# Patient Record
Sex: Male | Born: 1960 | Race: Black or African American | Hispanic: No | Smoking: Former smoker
Health system: Southern US, Community
[De-identification: ages and names within clinical notes are randomized; demographics above are authoritative.]

## PROBLEM LIST (undated history)

## (undated) ENCOUNTER — Emergency Department (HOSPITAL_COMMUNITY): Admission: EM | Disposition: A | Discharge status: Home / Self Care | Payer: 59 | Source: Home / Self Care

## (undated) DIAGNOSIS — I10 Essential (primary) hypertension: Secondary | ICD-10-CM

## (undated) DIAGNOSIS — Z992 Dependence on renal dialysis: Secondary | ICD-10-CM

## (undated) DIAGNOSIS — E119 Type 2 diabetes mellitus without complications: Secondary | ICD-10-CM

## (undated) DIAGNOSIS — F4310 Post-traumatic stress disorder, unspecified: Secondary | ICD-10-CM

## (undated) DIAGNOSIS — N186 End stage renal disease: Secondary | ICD-10-CM

## (undated) DIAGNOSIS — N289 Disorder of kidney and ureter, unspecified: Secondary | ICD-10-CM

## (undated) DIAGNOSIS — I219 Acute myocardial infarction, unspecified: Secondary | ICD-10-CM

## (undated) DIAGNOSIS — F32A Depression, unspecified: Secondary | ICD-10-CM

## (undated) DIAGNOSIS — Z5189 Encounter for other specified aftercare: Secondary | ICD-10-CM

## (undated) DIAGNOSIS — G4730 Sleep apnea, unspecified: Secondary | ICD-10-CM

## (undated) DIAGNOSIS — R011 Cardiac murmur, unspecified: Secondary | ICD-10-CM

## (undated) DIAGNOSIS — Z993 Dependence on wheelchair: Secondary | ICD-10-CM

## (undated) DIAGNOSIS — R0600 Dyspnea, unspecified: Secondary | ICD-10-CM

## (undated) DIAGNOSIS — A419 Sepsis, unspecified organism: Secondary | ICD-10-CM

## (undated) DIAGNOSIS — J449 Chronic obstructive pulmonary disease, unspecified: Secondary | ICD-10-CM

## (undated) DIAGNOSIS — K219 Gastro-esophageal reflux disease without esophagitis: Secondary | ICD-10-CM

## (undated) DIAGNOSIS — M1990 Unspecified osteoarthritis, unspecified site: Secondary | ICD-10-CM

## (undated) DIAGNOSIS — I509 Heart failure, unspecified: Secondary | ICD-10-CM

## (undated) HISTORY — PX: JOINT REPLACEMENT: SHX530

---

## 2005-01-19 DIAGNOSIS — N521 Erectile dysfunction due to diseases classified elsewhere: Secondary | ICD-10-CM | POA: Insufficient documentation

## 2008-05-12 DIAGNOSIS — K219 Gastro-esophageal reflux disease without esophagitis: Secondary | ICD-10-CM | POA: Insufficient documentation

## 2012-07-30 DIAGNOSIS — E782 Mixed hyperlipidemia: Secondary | ICD-10-CM | POA: Insufficient documentation

## 2013-05-02 DIAGNOSIS — Z96653 Presence of artificial knee joint, bilateral: Secondary | ICD-10-CM | POA: Insufficient documentation

## 2013-08-26 DIAGNOSIS — M1711 Unilateral primary osteoarthritis, right knee: Secondary | ICD-10-CM | POA: Insufficient documentation

## 2013-08-26 DIAGNOSIS — M170 Bilateral primary osteoarthritis of knee: Secondary | ICD-10-CM | POA: Insufficient documentation

## 2013-12-24 DIAGNOSIS — I5032 Chronic diastolic (congestive) heart failure: Secondary | ICD-10-CM | POA: Insufficient documentation

## 2013-12-24 DIAGNOSIS — G4733 Obstructive sleep apnea (adult) (pediatric): Secondary | ICD-10-CM | POA: Insufficient documentation

## 2014-01-13 DIAGNOSIS — K2950 Unspecified chronic gastritis without bleeding: Secondary | ICD-10-CM | POA: Insufficient documentation

## 2014-01-13 DIAGNOSIS — E1142 Type 2 diabetes mellitus with diabetic polyneuropathy: Secondary | ICD-10-CM | POA: Insufficient documentation

## 2014-04-29 DIAGNOSIS — I272 Pulmonary hypertension, unspecified: Secondary | ICD-10-CM | POA: Insufficient documentation

## 2015-12-21 DIAGNOSIS — F5104 Psychophysiologic insomnia: Secondary | ICD-10-CM | POA: Insufficient documentation

## 2016-04-18 DIAGNOSIS — E559 Vitamin D deficiency, unspecified: Secondary | ICD-10-CM | POA: Insufficient documentation

## 2016-05-30 DIAGNOSIS — F32A Depression, unspecified: Secondary | ICD-10-CM | POA: Insufficient documentation

## 2016-05-30 DIAGNOSIS — F330 Major depressive disorder, recurrent, mild: Secondary | ICD-10-CM | POA: Insufficient documentation

## 2016-08-15 DIAGNOSIS — Z86010 Personal history of colonic polyps: Secondary | ICD-10-CM | POA: Insufficient documentation

## 2017-12-15 DIAGNOSIS — M545 Low back pain, unspecified: Secondary | ICD-10-CM | POA: Insufficient documentation

## 2017-12-15 DIAGNOSIS — G8929 Other chronic pain: Secondary | ICD-10-CM | POA: Insufficient documentation

## 2018-01-25 DIAGNOSIS — M51369 Other intervertebral disc degeneration, lumbar region without mention of lumbar back pain or lower extremity pain: Secondary | ICD-10-CM | POA: Insufficient documentation

## 2018-01-25 DIAGNOSIS — M5136 Other intervertebral disc degeneration, lumbar region: Secondary | ICD-10-CM | POA: Insufficient documentation

## 2018-02-12 DIAGNOSIS — M12812 Other specific arthropathies, not elsewhere classified, left shoulder: Secondary | ICD-10-CM | POA: Insufficient documentation

## 2018-02-12 DIAGNOSIS — M19012 Primary osteoarthritis, left shoulder: Secondary | ICD-10-CM | POA: Insufficient documentation

## 2018-02-12 DIAGNOSIS — M12811 Other specific arthropathies, not elsewhere classified, right shoulder: Secondary | ICD-10-CM | POA: Insufficient documentation

## 2018-02-12 DIAGNOSIS — M19011 Primary osteoarthritis, right shoulder: Secondary | ICD-10-CM | POA: Insufficient documentation

## 2018-08-28 DIAGNOSIS — M4807 Spinal stenosis, lumbosacral region: Secondary | ICD-10-CM | POA: Insufficient documentation

## 2018-10-04 DIAGNOSIS — Z8614 Personal history of Methicillin resistant Staphylococcus aureus infection: Secondary | ICD-10-CM | POA: Insufficient documentation

## 2019-04-02 DIAGNOSIS — M5030 Other cervical disc degeneration, unspecified cervical region: Secondary | ICD-10-CM | POA: Insufficient documentation

## 2019-06-25 DIAGNOSIS — E8770 Fluid overload, unspecified: Secondary | ICD-10-CM | POA: Insufficient documentation

## 2019-10-28 DIAGNOSIS — M5412 Radiculopathy, cervical region: Secondary | ICD-10-CM | POA: Insufficient documentation

## 2019-11-06 DIAGNOSIS — Z8616 Personal history of COVID-19: Secondary | ICD-10-CM | POA: Insufficient documentation

## 2020-05-07 DIAGNOSIS — R252 Cramp and spasm: Secondary | ICD-10-CM | POA: Insufficient documentation

## 2020-05-08 DIAGNOSIS — M2041 Other hammer toe(s) (acquired), right foot: Secondary | ICD-10-CM | POA: Insufficient documentation

## 2020-05-08 DIAGNOSIS — M21611 Bunion of right foot: Secondary | ICD-10-CM | POA: Insufficient documentation

## 2020-05-08 DIAGNOSIS — B351 Tinea unguium: Secondary | ICD-10-CM | POA: Insufficient documentation

## 2020-05-08 DIAGNOSIS — M2042 Other hammer toe(s) (acquired), left foot: Secondary | ICD-10-CM | POA: Insufficient documentation

## 2020-07-03 DIAGNOSIS — G959 Disease of spinal cord, unspecified: Secondary | ICD-10-CM | POA: Insufficient documentation

## 2020-07-21 DIAGNOSIS — T7840XS Allergy, unspecified, sequela: Secondary | ICD-10-CM | POA: Insufficient documentation

## 2020-07-21 DIAGNOSIS — D689 Coagulation defect, unspecified: Secondary | ICD-10-CM | POA: Insufficient documentation

## 2020-07-21 DIAGNOSIS — Z23 Encounter for immunization: Secondary | ICD-10-CM | POA: Insufficient documentation

## 2020-07-21 DIAGNOSIS — Z87892 Personal history of anaphylaxis: Secondary | ICD-10-CM | POA: Insufficient documentation

## 2020-08-15 DIAGNOSIS — N2581 Secondary hyperparathyroidism of renal origin: Secondary | ICD-10-CM | POA: Insufficient documentation

## 2020-08-20 DIAGNOSIS — G8929 Other chronic pain: Secondary | ICD-10-CM | POA: Insufficient documentation

## 2020-08-20 DIAGNOSIS — R52 Pain, unspecified: Secondary | ICD-10-CM | POA: Insufficient documentation

## 2020-08-30 DIAGNOSIS — Z79899 Other long term (current) drug therapy: Secondary | ICD-10-CM | POA: Insufficient documentation

## 2020-08-31 DIAGNOSIS — Z981 Arthrodesis status: Secondary | ICD-10-CM | POA: Insufficient documentation

## 2020-09-06 DIAGNOSIS — E441 Mild protein-calorie malnutrition: Secondary | ICD-10-CM | POA: Insufficient documentation

## 2020-10-12 DIAGNOSIS — R0600 Dyspnea, unspecified: Secondary | ICD-10-CM | POA: Insufficient documentation

## 2020-10-12 DIAGNOSIS — M79603 Pain in arm, unspecified: Secondary | ICD-10-CM | POA: Insufficient documentation

## 2020-11-29 DIAGNOSIS — E876 Hypokalemia: Secondary | ICD-10-CM | POA: Insufficient documentation

## 2021-03-08 ENCOUNTER — Emergency Department (HOSPITAL_COMMUNITY): Payer: Medicare Other

## 2021-03-08 ENCOUNTER — Other Ambulatory Visit: Payer: Self-pay

## 2021-03-08 ENCOUNTER — Emergency Department (HOSPITAL_COMMUNITY)
Admission: EM | Admit: 2021-03-08 | Discharge: 2021-03-08 | Disposition: A | Discharge status: Home / Self Care | Payer: Medicare Other | Attending: Emergency Medicine | Admitting: Emergency Medicine

## 2021-03-08 ENCOUNTER — Encounter (HOSPITAL_COMMUNITY): Payer: Self-pay

## 2021-03-08 DIAGNOSIS — E1122 Type 2 diabetes mellitus with diabetic chronic kidney disease: Secondary | ICD-10-CM | POA: Insufficient documentation

## 2021-03-08 DIAGNOSIS — T829XXA Unspecified complication of cardiac and vascular prosthetic device, implant and graft, initial encounter: Secondary | ICD-10-CM | POA: Diagnosis present

## 2021-03-08 DIAGNOSIS — N186 End stage renal disease: Secondary | ICD-10-CM | POA: Insufficient documentation

## 2021-03-08 DIAGNOSIS — J449 Chronic obstructive pulmonary disease, unspecified: Secondary | ICD-10-CM | POA: Diagnosis not present

## 2021-03-08 DIAGNOSIS — M7989 Other specified soft tissue disorders: Secondary | ICD-10-CM | POA: Insufficient documentation

## 2021-03-08 DIAGNOSIS — X58XXXA Exposure to other specified factors, initial encounter: Secondary | ICD-10-CM | POA: Insufficient documentation

## 2021-03-08 DIAGNOSIS — R42 Dizziness and giddiness: Secondary | ICD-10-CM | POA: Insufficient documentation

## 2021-03-08 DIAGNOSIS — Z992 Dependence on renal dialysis: Secondary | ICD-10-CM | POA: Insufficient documentation

## 2021-03-08 DIAGNOSIS — E11649 Type 2 diabetes mellitus with hypoglycemia without coma: Secondary | ICD-10-CM | POA: Insufficient documentation

## 2021-03-08 DIAGNOSIS — Z87891 Personal history of nicotine dependence: Secondary | ICD-10-CM | POA: Insufficient documentation

## 2021-03-08 DIAGNOSIS — E162 Hypoglycemia, unspecified: Secondary | ICD-10-CM

## 2021-03-08 DIAGNOSIS — H538 Other visual disturbances: Secondary | ICD-10-CM | POA: Diagnosis not present

## 2021-03-08 DIAGNOSIS — Z96653 Presence of artificial knee joint, bilateral: Secondary | ICD-10-CM | POA: Insufficient documentation

## 2021-03-08 DIAGNOSIS — I509 Heart failure, unspecified: Secondary | ICD-10-CM | POA: Insufficient documentation

## 2021-03-08 DIAGNOSIS — I132 Hypertensive heart and chronic kidney disease with heart failure and with stage 5 chronic kidney disease, or end stage renal disease: Secondary | ICD-10-CM | POA: Diagnosis not present

## 2021-03-08 DIAGNOSIS — Z20822 Contact with and (suspected) exposure to covid-19: Secondary | ICD-10-CM | POA: Diagnosis not present

## 2021-03-08 HISTORY — DX: Unspecified osteoarthritis, unspecified site: M19.90

## 2021-03-08 HISTORY — DX: Type 2 diabetes mellitus without complications: E11.9

## 2021-03-08 HISTORY — DX: Disorder of kidney and ureter, unspecified: N28.9

## 2021-03-08 HISTORY — DX: Heart failure, unspecified: I50.9

## 2021-03-08 HISTORY — DX: Post-traumatic stress disorder, unspecified: F43.10

## 2021-03-08 HISTORY — DX: Chronic obstructive pulmonary disease, unspecified: J44.9

## 2021-03-08 HISTORY — DX: Essential (primary) hypertension: I10

## 2021-03-08 HISTORY — DX: Encounter for other specified aftercare: Z51.89

## 2021-03-08 LAB — CBC WITH DIFFERENTIAL/PLATELET
Abs Immature Granulocytes: 0.09 10*3/uL — ABNORMAL HIGH (ref 0.00–0.07)
Basophils Absolute: 0 10*3/uL (ref 0.0–0.1)
Basophils Relative: 0 %
Eosinophils Absolute: 0.3 10*3/uL (ref 0.0–0.5)
Eosinophils Relative: 4 %
HCT: 30.6 % — ABNORMAL LOW (ref 39.0–52.0)
Hemoglobin: 9.3 g/dL — ABNORMAL LOW (ref 13.0–17.0)
Immature Granulocytes: 1 %
Lymphocytes Relative: 16 %
Lymphs Abs: 1.2 10*3/uL (ref 0.7–4.0)
MCH: 29.2 pg (ref 26.0–34.0)
MCHC: 30.4 g/dL (ref 30.0–36.0)
MCV: 95.9 fL (ref 80.0–100.0)
Monocytes Absolute: 0.5 10*3/uL (ref 0.1–1.0)
Monocytes Relative: 7 %
Neutro Abs: 5.2 10*3/uL (ref 1.7–7.7)
Neutrophils Relative %: 72 %
Platelets: 101 10*3/uL — ABNORMAL LOW (ref 150–400)
RBC: 3.19 MIL/uL — ABNORMAL LOW (ref 4.22–5.81)
RDW: 17.3 % — ABNORMAL HIGH (ref 11.5–15.5)
WBC: 7.3 10*3/uL (ref 4.0–10.5)
nRBC: 0 % (ref 0.0–0.2)

## 2021-03-08 LAB — COMPREHENSIVE METABOLIC PANEL
ALT: 19 U/L (ref 0–44)
AST: 26 U/L (ref 15–41)
Albumin: 3.7 g/dL (ref 3.5–5.0)
Alkaline Phosphatase: 71 U/L (ref 38–126)
Anion gap: 14 (ref 5–15)
BUN: 34 mg/dL — ABNORMAL HIGH (ref 6–20)
CO2: 23 mmol/L (ref 22–32)
Calcium: 8.8 mg/dL — ABNORMAL LOW (ref 8.9–10.3)
Chloride: 100 mmol/L (ref 98–111)
Creatinine, Ser: 2.4 mg/dL — ABNORMAL HIGH (ref 0.61–1.24)
GFR, Estimated: 30 mL/min — ABNORMAL LOW (ref >60)
Glucose, Bld: 73 mg/dL (ref 70–99)
Potassium: 3.6 mmol/L (ref 3.5–5.1)
Sodium: 137 mmol/L (ref 135–145)
Total Bilirubin: 0.6 mg/dL (ref 0.3–1.2)
Total Protein: 7.7 g/dL (ref 6.5–8.1)

## 2021-03-08 LAB — CBG MONITORING, ED
Glucose-Capillary: 41 mg/dL — CL (ref 70–99)
Glucose-Capillary: 215 mg/dL — ABNORMAL HIGH (ref 70–99)

## 2021-03-08 LAB — RESP PANEL BY RT-PCR (FLU A&B, COVID) ARPGX2
Influenza A by PCR: NEGATIVE
Influenza B by PCR: NEGATIVE
SARS Coronavirus 2 by RT PCR: NEGATIVE

## 2021-03-08 MED ORDER — DEXTROSE 50 % IV SOLN
1.0000 | Freq: Once | INTRAVENOUS | Status: AC
  Administered 2021-03-08: 50 mL via INTRAVENOUS
  Filled 2021-03-08: qty 50

## 2021-03-08 NOTE — Discharge Instructions (Addendum)
Follow-up for the fistulogram on Thursday as planned.  Use the catheter for dialysis on Wednesday, not the graft.

## 2021-03-08 NOTE — ED Notes (Signed)
Got patient undressed into a gown on the monitor did ekg shown to Dr Alvino Chapel patient is resting with call bell in reach

## 2021-03-08 NOTE — ED Notes (Signed)
Reviewed discharge instructions with patient. Follow-up care reviewed. Patient verbalized understanding. Patient A&Ox4, VSS upon discharge.  

## 2021-03-08 NOTE — ED Notes (Signed)
Pt reported feeling like his sugar was low and that his glucometer on his arm was reading low. Checked CBG and it was 41. Notified PA and will notify Pickering MD. Hanley Seamen pt 2 OJ w/ sugar and crackers and peanut butter. Will recheck CBG and continue to monitor pt.

## 2021-03-08 NOTE — ED Notes (Signed)
CBG 215

## 2021-03-08 NOTE — ED Provider Notes (Addendum)
[facility] EMERGENCY DEPARTMENT Provider Note   CSN: [id number] Arrival date & time: 03/08/21  1114     History Chief Complaint  Patient presents with  . Numbness  . Blurred Vision  . Dizziness    Jeffrey [name] is a 60 y.o. male.  HPI Patient presents with swelling at his AV graft.  Recently started using graft.  Was used on Friday but only able to use 1 needle in it.  Other dialysis done through chest catheter.  Today, off Monday, the patient had a needle placed in the lower part of the graft.  States symptoms began to hurt and swell up more.  States was dialyzed 3 out of 4 hours out of his chest wall catheter.  States he now cannot feel either have his upper extremities.  States his fingers feel numb.  Also states he cannot move his lower legs.  However is able to move his legs to command.  States he feels as if he is caring extra fluid.  States he has to be removed from dialysis because he was feeling bad.  His vascular surgeon and nephrologist are at [facility].    Past Medical History:  Diagnosis Date  . Arthritis   . Blood transfusion without reported diagnosis   . CHF (congestive heart failure) ([place])   . COPD (chronic obstructive pulmonary disease) ([place])   . Diabetes mellitus without complication ([place])   . Hypertension   . PTSD (post-traumatic stress disorder)   . Renal disorder    Stage 5 Kidney Failure    There are no problems to display for this patient.   Past Surgical History:  Procedure Laterality Date  . JOINT REPLACEMENT     Bilateral knees       History reviewed. No pertinent family history.  Social History   Tobacco Use  . Smoking status: Former Smoker    Types: Cigarettes    Quit date: 2012    Years since quitting: 10.2  . Smokeless tobacco: Never Used  Vaping Use  . Vaping Use: Never used  Substance Use Topics  . Alcohol use: Yes  . Drug use: Not Currently    Types: Marijuana    Comment: Years ago    Home  Medications Prior to Admission medications   Not on File    Allergies    Bupropion, Dexmedetomidine, Ibuprofen, and Pioglitazone  Review of Systems   Review of Systems  Constitutional: Negative for appetite change and fever.  HENT: Negative for congestion.   Respiratory: Negative for shortness of breath.   Cardiovascular: Positive for leg swelling.  Gastrointestinal: Negative for anal bleeding.  Genitourinary: Negative for flank pain.  Musculoskeletal: Negative for back pain.  Skin: Negative for wound.  Neurological: Positive for weakness and numbness.  Psychiatric/Behavioral: Negative for confusion.    Physical Exam Updated Vital Signs BP 118/63   Pulse 91   Temp 99.1 F (37.3 C) (Oral)   Resp 20   Ht '5\' 5"'$  (1.651 m)   Wt 127.4 kg Comment: prior to dialysis tx  SpO2 97%   BMI 46.74 kg/m   Physical Exam Vitals and nursing note reviewed.  HENT:     Head: Normocephalic.  Eyes:     Pupils: Pupils are equal, round, and reactive to light.  Cardiovascular:     Rate and Rhythm: Regular rhythm.  Pulmonary:     Breath sounds: No rales.     Comments: Dialysis catheter right upper chest wall. Chest:  Chest wall: No tenderness.  Abdominal:     Tenderness: There is no abdominal tenderness.  Musculoskeletal:     Cervical back: Neck supple.     Comments: Dialysis graft right upper extremity.  Some edema bilateral upper extremities.  Worse on right.  Skin:    Capillary Refill: Capillary refill takes less than 2 seconds.  Neurological:     Mental Status: He is alert and oriented to person, place, and time.  Psychiatric:        Mood and Affect: Mood normal.     ED Results / Procedures / Treatments   Labs (all labs ordered are listed, but only abnormal results are displayed) Labs Reviewed  COMPREHENSIVE METABOLIC PANEL - Abnormal; Notable for the following components:      Result Value   BUN 34 (*)    Creatinine, Ser 2.40 (*)    Calcium 8.8 (*)    GFR, Estimated  30 (*)    All other components within normal limits  CBC WITH DIFFERENTIAL/PLATELET - Abnormal; Notable for the following components:   RBC 3.19 (*)    Hemoglobin 9.3 (*)    HCT 30.6 (*)    RDW 17.3 (*)    Platelets 101 (*)    Abs Immature Granulocytes 0.09 (*)    All other components within normal limits  CBG MONITORING, ED - Abnormal; Notable for the following components:   Glucose-Capillary 41 (*)    All other components within normal limits  CBG MONITORING, ED - Abnormal; Notable for the following components:   Glucose-Capillary 215 (*)    All other components within normal limits  RESP PANEL BY RT-PCR (FLU A&B, COVID) ARPGX2    EKG EKG Interpretation  Date/Time:  Monday March 08 2021 11:24:14 EDT Ventricular Rate:  89 PR Interval:  137 QRS Duration: 86 QT Interval:  387 QTC Calculation: 471 R Axis:   67 Text Interpretation: Sinus rhythm Confirmed by Davonna Belling [phone]) on 03/08/2021 1:15:57 PM   Radiology DG Chest Portable 1 View  Result Date: 03/08/2021 CLINICAL DATA:  Weakness, on dialysis EXAM: PORTABLE CHEST 1 VIEW COMPARISON:  None. FINDINGS: Right IJ approach hemodialysis catheter terminates at the level of the right atrium. Heart size within normal limits. Slightly low lung volumes. No vascular congestion or significant edema. No airspace consolidation. No pleural effusion or pneumothorax. Degenerative changes of the right shoulder. IMPRESSION: No acute cardiopulmonary findings. Electronically Signed   By: Davina Poke D.O.   On: 03/08/2021 14:37    Procedures Procedures   Medications Ordered in ED Medications  dextrose 50 % solution 50 mL (50 mLs Intravenous Given 03/08/21 1541)    ED Course  I have reviewed the triage vital signs and the nursing notes.  Pertinent labs & imaging results that were available during my care of the patient were reviewed by me and considered in my medical decision making (see chart for details).    MDM  Rules/Calculators/A&P                          Patient brought in after having difficulty with his dialysis graft on the right upper extremity.  Some swelling.  Potentially hematoma but does not appear to be actively extravasating at this time.  Had 3 out of his 4 hours of dialysis done.  States he was tingly and numb but seem to be covering all of his body with it.  Feeling somewhat better now.  His weight is up  but does not appear to need emergent dialysis.  Discussed with Dr. Augustin Coupe from nephrology and Dr. Carlis Abbott from vascular surgery.  Appears stable for outpatient follow-up.  Patient's own vascular surgeon has arranged for fistulogram on Thursday.  Can be dialyzed on Wednesday but will only use his chest catheter.  Discharge home. Patient did have hypoglycemia while in the ER.  Treated with some IV glucose and oral food.  Stable for discharge where he can manage that also. Final Clinical Impression(s) / ED Diagnoses Final diagnoses:  End stage renal disease on dialysis [facility])  Complications due to renal dialysis device, implant, and graft, initial encounter  Hypoglycemia    Rx / DC Orders ED Discharge Orders    None       Davonna Belling, MD 03/08/21 [place], Knox Cervi, MD 04/01/21 [phone]

## 2021-03-08 NOTE — ED Triage Notes (Addendum)
Pt arrived via EMS from dialysis center w/ c/o numbness and swelling in all 4 extremities. Onset of s/s 0900.Pt is on a MWF schedule, and recently received approval for use of a new graft site on the rt arm. Dialysis was unable to access this graft site and used the dialysis port on the rt chest. Pt received 4 out of 5 hours of treatment today. Pt also c/o blurred vision and dizziness onset also 0900 after dialysis tried to access R upper arm graft. Pt reports feeling like extremities are "full of fluid". Reports decreased sensation to all four extremities, an no sensation to fingertips. VSS w/ BP 142/96, HR 86, RR 20, SpO2 100% RA, and CBG of 96.  Pt reports all of his information is at [facility]. He lives in [place] and does dialysis at [facility].

## 2021-03-28 ENCOUNTER — Encounter (HOSPITAL_COMMUNITY): Payer: Self-pay | Admitting: Emergency Medicine

## 2021-03-28 ENCOUNTER — Emergency Department (HOSPITAL_COMMUNITY): Payer: Medicare Other

## 2021-03-28 ENCOUNTER — Inpatient Hospital Stay (HOSPITAL_COMMUNITY)
Admission: EM | Admit: 2021-03-28 | Discharge: 2021-04-14 | DRG: 474 | Disposition: A | Discharge status: Skilled Nursing Facility | Payer: Medicare Other | Attending: Internal Medicine | Admitting: Internal Medicine

## 2021-03-28 DIAGNOSIS — D6959 Other secondary thrombocytopenia: Secondary | ICD-10-CM | POA: Diagnosis present

## 2021-03-28 DIAGNOSIS — A419 Sepsis, unspecified organism: Secondary | ICD-10-CM

## 2021-03-28 DIAGNOSIS — R7881 Bacteremia: Secondary | ICD-10-CM

## 2021-03-28 DIAGNOSIS — Z79899 Other long term (current) drug therapy: Secondary | ICD-10-CM

## 2021-03-28 DIAGNOSIS — E1122 Type 2 diabetes mellitus with diabetic chronic kidney disease: Secondary | ICD-10-CM | POA: Diagnosis present

## 2021-03-28 DIAGNOSIS — Z794 Long term (current) use of insulin: Secondary | ICD-10-CM

## 2021-03-28 DIAGNOSIS — R6520 Severe sepsis without septic shock: Secondary | ICD-10-CM | POA: Diagnosis present

## 2021-03-28 DIAGNOSIS — T8454XA Infection and inflammatory reaction due to internal left knee prosthesis, initial encounter: Secondary | ICD-10-CM | POA: Diagnosis not present

## 2021-03-28 DIAGNOSIS — E11649 Type 2 diabetes mellitus with hypoglycemia without coma: Secondary | ICD-10-CM | POA: Diagnosis not present

## 2021-03-28 DIAGNOSIS — A4102 Sepsis due to Methicillin resistant Staphylococcus aureus: Secondary | ICD-10-CM | POA: Diagnosis present

## 2021-03-28 DIAGNOSIS — Z96651 Presence of right artificial knee joint: Secondary | ICD-10-CM | POA: Diagnosis present

## 2021-03-28 DIAGNOSIS — M25562 Pain in left knee: Secondary | ICD-10-CM

## 2021-03-28 DIAGNOSIS — E1165 Type 2 diabetes mellitus with hyperglycemia: Secondary | ICD-10-CM | POA: Diagnosis present

## 2021-03-28 DIAGNOSIS — G546 Phantom limb syndrome with pain: Secondary | ICD-10-CM | POA: Diagnosis present

## 2021-03-28 DIAGNOSIS — F4310 Post-traumatic stress disorder, unspecified: Secondary | ICD-10-CM

## 2021-03-28 DIAGNOSIS — I132 Hypertensive heart and chronic kidney disease with heart failure and with stage 5 chronic kidney disease, or end stage renal disease: Secondary | ICD-10-CM | POA: Diagnosis present

## 2021-03-28 DIAGNOSIS — D649 Anemia, unspecified: Secondary | ICD-10-CM

## 2021-03-28 DIAGNOSIS — E871 Hypo-osmolality and hyponatremia: Secondary | ICD-10-CM | POA: Diagnosis present

## 2021-03-28 DIAGNOSIS — D638 Anemia in other chronic diseases classified elsewhere: Secondary | ICD-10-CM

## 2021-03-28 DIAGNOSIS — N186 End stage renal disease: Secondary | ICD-10-CM

## 2021-03-28 DIAGNOSIS — K5900 Constipation, unspecified: Secondary | ICD-10-CM | POA: Diagnosis not present

## 2021-03-28 DIAGNOSIS — I10 Essential (primary) hypertension: Secondary | ICD-10-CM

## 2021-03-28 DIAGNOSIS — Y831 Surgical operation with implant of artificial internal device as the cause of abnormal reaction of the patient, or of later complication, without mention of misadventure at the time of the procedure: Secondary | ICD-10-CM | POA: Diagnosis present

## 2021-03-28 DIAGNOSIS — I152 Hypertension secondary to endocrine disorders: Secondary | ICD-10-CM

## 2021-03-28 DIAGNOSIS — D62 Acute posthemorrhagic anemia: Secondary | ICD-10-CM | POA: Diagnosis not present

## 2021-03-28 DIAGNOSIS — J449 Chronic obstructive pulmonary disease, unspecified: Secondary | ICD-10-CM

## 2021-03-28 DIAGNOSIS — Z7982 Long term (current) use of aspirin: Secondary | ICD-10-CM

## 2021-03-28 DIAGNOSIS — Z95828 Presence of other vascular implants and grafts: Secondary | ICD-10-CM

## 2021-03-28 DIAGNOSIS — R509 Fever, unspecified: Secondary | ICD-10-CM | POA: Diagnosis present

## 2021-03-28 DIAGNOSIS — Z6841 Body Mass Index (BMI) 40.0 and over, adult: Secondary | ICD-10-CM

## 2021-03-28 DIAGNOSIS — E785 Hyperlipidemia, unspecified: Secondary | ICD-10-CM | POA: Diagnosis present

## 2021-03-28 DIAGNOSIS — D631 Anemia in chronic kidney disease: Secondary | ICD-10-CM | POA: Diagnosis present

## 2021-03-28 DIAGNOSIS — Z992 Dependence on renal dialysis: Secondary | ICD-10-CM

## 2021-03-28 DIAGNOSIS — G8929 Other chronic pain: Secondary | ICD-10-CM | POA: Diagnosis present

## 2021-03-28 DIAGNOSIS — Z87891 Personal history of nicotine dependence: Secondary | ICD-10-CM

## 2021-03-28 DIAGNOSIS — E1169 Type 2 diabetes mellitus with other specified complication: Secondary | ICD-10-CM

## 2021-03-28 DIAGNOSIS — Z886 Allergy status to analgesic agent status: Secondary | ICD-10-CM

## 2021-03-28 DIAGNOSIS — Z20822 Contact with and (suspected) exposure to covid-19: Secondary | ICD-10-CM | POA: Diagnosis present

## 2021-03-28 DIAGNOSIS — E1159 Type 2 diabetes mellitus with other circulatory complications: Secondary | ICD-10-CM

## 2021-03-28 DIAGNOSIS — G4710 Hypersomnia, unspecified: Secondary | ICD-10-CM | POA: Diagnosis present

## 2021-03-28 DIAGNOSIS — K219 Gastro-esophageal reflux disease without esophagitis: Secondary | ICD-10-CM | POA: Diagnosis present

## 2021-03-28 DIAGNOSIS — B9562 Methicillin resistant Staphylococcus aureus infection as the cause of diseases classified elsewhere: Secondary | ICD-10-CM

## 2021-03-28 DIAGNOSIS — I509 Heart failure, unspecified: Secondary | ICD-10-CM

## 2021-03-28 LAB — RESP PANEL BY RT-PCR (FLU A&B, COVID) ARPGX2
Influenza A by PCR: NEGATIVE
Influenza B by PCR: NEGATIVE
SARS Coronavirus 2 by RT PCR: NEGATIVE

## 2021-03-28 MED ORDER — LACTATED RINGERS IV BOLUS
1000.0000 mL | Freq: Once | INTRAVENOUS | Status: AC
  Administered 2021-03-29: 1000 mL via INTRAVENOUS

## 2021-03-28 MED ORDER — LIDOCAINE HCL (PF) 1 % IJ SOLN
5.0000 mL | Freq: Once | INTRAMUSCULAR | Status: AC
  Filled 2021-03-28: qty 5

## 2021-03-28 MED ORDER — SODIUM CHLORIDE 0.9 % IV SOLN
2.0000 g | Freq: Once | INTRAVENOUS | Status: AC
  Administered 2021-03-29: 2 g via INTRAVENOUS
  Filled 2021-03-28: qty 2

## 2021-03-28 MED ORDER — HYDROMORPHONE HCL 1 MG/ML IJ SOLN
1.0000 mg | Freq: Once | INTRAMUSCULAR | Status: AC
  Administered 2021-03-28: 1 mg via INTRAVENOUS
  Filled 2021-03-28 (×2): qty 1

## 2021-03-28 MED ORDER — LACTATED RINGERS IV SOLN: INTRAVENOUS | Status: DC

## 2021-03-28 MED ORDER — LIDOCAINE HCL (PF) 1 % IJ SOLN
INTRAMUSCULAR | Status: AC
  Administered 2021-03-29: 5 mL via INTRADERMAL
  Filled 2021-03-28: qty 5

## 2021-03-28 MED ORDER — VANCOMYCIN HCL 10 G IV SOLR
2500.0000 mg | Freq: Once | INTRAVENOUS | Status: AC
  Administered 2021-03-29: 2500 mg via INTRAVENOUS
  Filled 2021-03-28: qty 2500

## 2021-03-28 NOTE — ED Notes (Signed)
Provider at bedside

## 2021-03-28 NOTE — ED Notes (Signed)
RN unable to obtain IV access, IV team order placed. Pickering MD aware

## 2021-03-28 NOTE — ED Triage Notes (Addendum)
Pt BIB [facility] from home for fever, generalized pain. Pt believes his dialysis catheter is infected, says this has happened in the past. Pt had home temp of 102.8, c/o pain to both knees saying he is now unable to ambulate due to pain. Pt took 1g tylenol PTA.Marland Kitchen Pt uses oxygen at home as needed, per EMS pt was 90% on RA.

## 2021-03-28 NOTE — ED Notes (Signed)
Iv team at bedside. Only able to obtain 1 bc

## 2021-03-28 NOTE — ED Provider Notes (Signed)
[facility] EMERGENCY DEPARTMENT Provider Note   CSN: [id number] Arrival date & time: 03/28/21  2121     History Chief Complaint  Patient presents with  . Fever  . Generalized Body Aches    Jeffrey [name] is a 60 y.o. male.  HPI Infected patient presents with fever.  Aching all over.  Thinks that he may have a sepsis from his dialysis catheter.  States temperatures up to 102.8 at home.  States he has pain in his left knee also.  Has a history of chronic knee pain is on pain medicines for but states it is more painful.  No nausea or vomiting.  No cough.  Not been around anyone sick.  He is a Monday Wednesday Friday dialysis and was dialyzed on Friday through his chest catheter.  Does have a graft on his arm that can be used for dialysis.    Past Medical History:  Diagnosis Date  . Arthritis   . Blood transfusion without reported diagnosis   . CHF (congestive heart failure) ([place])   . COPD (chronic obstructive pulmonary disease) ([place])   . Diabetes mellitus without complication ([place])   . Hypertension   . PTSD (post-traumatic stress disorder)   . Renal disorder    Stage 5 Kidney Failure    There are no problems to display for this patient.   Past Surgical History:  Procedure Laterality Date  . JOINT REPLACEMENT     Bilateral knees       History reviewed. No pertinent family history.  Social History   Tobacco Use  . Smoking status: Former Smoker    Types: Cigarettes    Quit date: 2012    Years since quitting: 10.3  . Smokeless tobacco: Never Used  Vaping Use  . Vaping Use: Never used  Substance Use Topics  . Alcohol use: Yes  . Drug use: Not Currently    Types: Marijuana    Comment: Years ago    Home Medications Prior to Admission medications   Not on File    Allergies    Bupropion, Dexmedetomidine, Ibuprofen, and Pioglitazone  Review of Systems   Review of Systems  Constitutional: Positive for fever. Negative for appetite change.   HENT: Negative for congestion.   Respiratory: Negative for cough and shortness of breath.   Gastrointestinal: Negative for abdominal pain.  Genitourinary: Negative for flank pain.  Musculoskeletal:       Left knee pain.  Neurological: Negative for weakness.  Psychiatric/Behavioral: Negative for confusion.    Physical Exam Updated Vital Signs BP (!) 142/75 (BP Location: Left Arm)   Pulse (!) 126   Temp (!) 103 F (39.4 C) (Oral)   Resp 20   Ht '5\' 5"'$  (1.651 m)   Wt 128 kg   SpO2 95%   BMI 46.96 kg/m   Physical Exam Vitals and nursing note reviewed.  HENT:     Head: Normocephalic.  Eyes:     Pupils: Pupils are equal, round, and reactive to light.  Cardiovascular:     Rate and Rhythm: Tachycardia present.  Pulmonary:     Comments: Right chest wall dialysis catheter.  No surrounding erythema. Abdominal:     Tenderness: There is no abdominal tenderness.  Musculoskeletal:        General: Tenderness present.     Comments: Tenderness with effusion of left knee.  Decreased range of motion.  Warm on knees bilaterally but worse on the left.  Skin:    General: Skin is  warm.     Capillary Refill: Capillary refill takes less than 2 seconds.  Neurological:     Mental Status: He is alert and oriented to person, place, and time.     ED Results / Procedures / Treatments   Labs (all labs ordered are listed, but only abnormal results are displayed) Labs Reviewed  RESP PANEL BY RT-PCR (FLU A&B, COVID) ARPGX2  CULTURE, BLOOD (ROUTINE X 2)  CULTURE, BLOOD (ROUTINE X 2)  URINE CULTURE  LACTIC ACID, PLASMA  LACTIC ACID, PLASMA  COMPREHENSIVE METABOLIC PANEL  CBC WITH DIFFERENTIAL/PLATELET  PROTIME-INR  APTT  URINALYSIS, ROUTINE W REFLEX MICROSCOPIC    EKG None  Radiology DG Chest Port 1 View  Result Date: 03/28/2021 CLINICAL DATA:  Possible sepsis, fever, generalized pain, possible dialysis catheter infection EXAM: PORTABLE CHEST 1 VIEW COMPARISON:  03/08/2021 FINDINGS:  Low lung volumes. Increasing opacities in the mid to lower lungs, could reflect atelectasis though developing edema or atypical infection could have a similar appearance in the appropriate clinical setting. Cardiomegaly with pulmonary vascular congestion. Dual lumen dialysis catheter tip terminates at the right atrium. Telemetry leads overlie the chest. No pneumothorax or visible effusion. No acute osseous or soft tissue abnormality. IMPRESSION: Low volumes with basilar opacities which may reflect atelectasis though edema or infection could have a similar appearance in the appropriate clinical setting. Right IJ approach dialysis catheter tip terminates at the right atrium. Electronically Signed   By: Lovena Le M.D.   On: 03/28/2021 22:51   DG Knee Complete 4 Views Left  Result Date: 03/28/2021 CLINICAL DATA:  Possible sepsis, fever and generalized pain EXAM: LEFT KNEE - COMPLETE 4+ VIEW COMPARISON:  None. FINDINGS: Extensive circumferential swelling with large joint effusion. Postsurgical changes from prior tricompartmental left knee arthroplasty with some questionable destructive changes about the articular margins as well as periprosthetic lucency surrounding the tibial component and cemented tibial stem. Additional heterotopic ossification is seen as well. No soft tissue gas. 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 Electronically Signed   By: Lovena Le M.D.   On: 03/28/2021 22:56    Procedures Procedures   Medications Ordered in ED Medications  lactated ringers infusion (has no administration in time range)  vancomycin (VANCOCIN) 2,500 mg in sodium chloride 0.9 % 500 mL IVPB (has no administration in time range)  ceFEPIme (MAXIPIME) 2 g in sodium chloride 0.9 % 100 mL IVPB (has no administration in time  range)  lactated ringers bolus 1,000 mL (has no administration in time range)  HYDROmorphone (DILAUDID) injection 1 mg (1 mg Intravenous Given 03/28/21 2334)    ED Course  I have reviewed the triage vital signs and the nursing notes.  Pertinent labs & imaging results that were available during my care of the patient were reviewed by me and considered in my medical decision making (see chart for details).    MDM Rules/Calculators/A&P                          Patient presents with fever.  Dialysis patient.  States he has had previous line infections.  Also potentially could have left knee infection.  X-rays now returned and showed knee effusion and potentially hardware infection.  Has been having difficulty getting IV access.  IV team consulted.  Blood pressures been maintained although patient is tachycardic.  Cefepime and  vancomycin have been ordered.  Will give small fluid bolus but not 30/kg at this time.  Patient's primary care and orthopedic surgery are at [facility]. Care turned over to Dr Stark Jock Final Clinical Impression(s) / ED Diagnoses Final diagnoses:  Sepsis, due to unspecified organism, unspecified whether acute organ dysfunction present [facility])    Rx / DC Orders ED Discharge Orders    None       Davonna Belling, MD 03/28/21 2349

## 2021-03-28 NOTE — ED Notes (Signed)
Received verbal report from Carlis Abbott RN

## 2021-03-28 NOTE — Progress Notes (Signed)
Elink following for sepsis protocol. 

## 2021-03-29 ENCOUNTER — Other Ambulatory Visit: Payer: Self-pay

## 2021-03-29 ENCOUNTER — Inpatient Hospital Stay (HOSPITAL_COMMUNITY): Payer: Medicare Other

## 2021-03-29 DIAGNOSIS — M009 Pyogenic arthritis, unspecified: Secondary | ICD-10-CM

## 2021-03-29 DIAGNOSIS — Z794 Long term (current) use of insulin: Secondary | ICD-10-CM | POA: Diagnosis not present

## 2021-03-29 DIAGNOSIS — D6959 Other secondary thrombocytopenia: Secondary | ICD-10-CM | POA: Diagnosis present

## 2021-03-29 DIAGNOSIS — N186 End stage renal disease: Secondary | ICD-10-CM | POA: Diagnosis present

## 2021-03-29 DIAGNOSIS — G8929 Other chronic pain: Secondary | ICD-10-CM | POA: Diagnosis present

## 2021-03-29 DIAGNOSIS — R509 Fever, unspecified: Secondary | ICD-10-CM | POA: Diagnosis present

## 2021-03-29 DIAGNOSIS — Z886 Allergy status to analgesic agent status: Secondary | ICD-10-CM | POA: Diagnosis not present

## 2021-03-29 DIAGNOSIS — D62 Acute posthemorrhagic anemia: Secondary | ICD-10-CM | POA: Diagnosis not present

## 2021-03-29 DIAGNOSIS — E871 Hypo-osmolality and hyponatremia: Secondary | ICD-10-CM | POA: Diagnosis present

## 2021-03-29 DIAGNOSIS — T8454XD Infection and inflammatory reaction due to internal left knee prosthesis, subsequent encounter: Secondary | ICD-10-CM | POA: Diagnosis not present

## 2021-03-29 DIAGNOSIS — E1159 Type 2 diabetes mellitus with other circulatory complications: Secondary | ICD-10-CM

## 2021-03-29 DIAGNOSIS — R7881 Bacteremia: Secondary | ICD-10-CM | POA: Diagnosis not present

## 2021-03-29 DIAGNOSIS — Z79899 Other long term (current) drug therapy: Secondary | ICD-10-CM | POA: Diagnosis not present

## 2021-03-29 DIAGNOSIS — R5081 Fever presenting with conditions classified elsewhere: Secondary | ICD-10-CM | POA: Diagnosis not present

## 2021-03-29 DIAGNOSIS — B9562 Methicillin resistant Staphylococcus aureus infection as the cause of diseases classified elsewhere: Secondary | ICD-10-CM | POA: Diagnosis not present

## 2021-03-29 DIAGNOSIS — A419 Sepsis, unspecified organism: Secondary | ICD-10-CM

## 2021-03-29 DIAGNOSIS — Z87891 Personal history of nicotine dependence: Secondary | ICD-10-CM | POA: Diagnosis not present

## 2021-03-29 DIAGNOSIS — Z95828 Presence of other vascular implants and grafts: Secondary | ICD-10-CM | POA: Diagnosis not present

## 2021-03-29 DIAGNOSIS — E1165 Type 2 diabetes mellitus with hyperglycemia: Secondary | ICD-10-CM | POA: Diagnosis present

## 2021-03-29 DIAGNOSIS — E1169 Type 2 diabetes mellitus with other specified complication: Secondary | ICD-10-CM

## 2021-03-29 DIAGNOSIS — I132 Hypertensive heart and chronic kidney disease with heart failure and with stage 5 chronic kidney disease, or end stage renal disease: Secondary | ICD-10-CM | POA: Diagnosis present

## 2021-03-29 DIAGNOSIS — J449 Chronic obstructive pulmonary disease, unspecified: Secondary | ICD-10-CM | POA: Diagnosis present

## 2021-03-29 DIAGNOSIS — M25562 Pain in left knee: Secondary | ICD-10-CM | POA: Diagnosis not present

## 2021-03-29 DIAGNOSIS — Z992 Dependence on renal dialysis: Secondary | ICD-10-CM | POA: Diagnosis not present

## 2021-03-29 DIAGNOSIS — A4102 Sepsis due to Methicillin resistant Staphylococcus aureus: Secondary | ICD-10-CM | POA: Diagnosis present

## 2021-03-29 DIAGNOSIS — F4310 Post-traumatic stress disorder, unspecified: Secondary | ICD-10-CM | POA: Diagnosis present

## 2021-03-29 DIAGNOSIS — Z7982 Long term (current) use of aspirin: Secondary | ICD-10-CM | POA: Diagnosis not present

## 2021-03-29 DIAGNOSIS — Z96651 Presence of right artificial knee joint: Secondary | ICD-10-CM | POA: Diagnosis present

## 2021-03-29 DIAGNOSIS — I509 Heart failure, unspecified: Secondary | ICD-10-CM

## 2021-03-29 DIAGNOSIS — E785 Hyperlipidemia, unspecified: Secondary | ICD-10-CM | POA: Diagnosis present

## 2021-03-29 DIAGNOSIS — R6520 Severe sepsis without septic shock: Secondary | ICD-10-CM | POA: Diagnosis present

## 2021-03-29 DIAGNOSIS — G4710 Hypersomnia, unspecified: Secondary | ICD-10-CM | POA: Diagnosis present

## 2021-03-29 DIAGNOSIS — T8454XA Infection and inflammatory reaction due to internal left knee prosthesis, initial encounter: Secondary | ICD-10-CM | POA: Diagnosis present

## 2021-03-29 DIAGNOSIS — Z20822 Contact with and (suspected) exposure to covid-19: Secondary | ICD-10-CM | POA: Diagnosis present

## 2021-03-29 DIAGNOSIS — Y831 Surgical operation with implant of artificial internal device as the cause of abnormal reaction of the patient, or of later complication, without mention of misadventure at the time of the procedure: Secondary | ICD-10-CM | POA: Diagnosis present

## 2021-03-29 DIAGNOSIS — Z6841 Body Mass Index (BMI) 40.0 and over, adult: Secondary | ICD-10-CM | POA: Diagnosis not present

## 2021-03-29 DIAGNOSIS — I152 Hypertension secondary to endocrine disorders: Secondary | ICD-10-CM

## 2021-03-29 LAB — GLUCOSE, CAPILLARY
Glucose-Capillary: 397 mg/dL — ABNORMAL HIGH (ref 70–99)
Glucose-Capillary: 413 mg/dL — ABNORMAL HIGH (ref 70–99)
Glucose-Capillary: 390 mg/dL — ABNORMAL HIGH (ref 70–99)
Glucose-Capillary: 400 mg/dL — ABNORMAL HIGH (ref 70–99)
Glucose-Capillary: 218 mg/dL — ABNORMAL HIGH (ref 70–99)
Glucose-Capillary: 271 mg/dL — ABNORMAL HIGH (ref 70–99)

## 2021-03-29 LAB — CBC WITH DIFFERENTIAL/PLATELET
Abs Immature Granulocytes: 0.15 10*3/uL — ABNORMAL HIGH (ref 0.00–0.07)
Basophils Absolute: 0 10*3/uL (ref 0.0–0.1)
Basophils Relative: 0 %
Eosinophils Absolute: 0 10*3/uL (ref 0.0–0.5)
Eosinophils Relative: 0 %
HCT: 32.5 % — ABNORMAL LOW (ref 39.0–52.0)
Hemoglobin: 10.5 g/dL — ABNORMAL LOW (ref 13.0–17.0)
Immature Granulocytes: 1 %
Lymphocytes Relative: 4 %
Lymphs Abs: 0.4 10*3/uL — ABNORMAL LOW (ref 0.7–4.0)
MCH: 29.4 pg (ref 26.0–34.0)
MCHC: 32.3 g/dL (ref 30.0–36.0)
MCV: 91 fL (ref 80.0–100.0)
Monocytes Absolute: 0.9 10*3/uL (ref 0.1–1.0)
Monocytes Relative: 8 %
Neutro Abs: 10.8 10*3/uL — ABNORMAL HIGH (ref 1.7–7.7)
Neutrophils Relative %: 87 %
Platelets: 139 10*3/uL — ABNORMAL LOW (ref 150–400)
RBC: 3.57 MIL/uL — ABNORMAL LOW (ref 4.22–5.81)
RDW: 17.2 % — ABNORMAL HIGH (ref 11.5–15.5)
WBC: 12.3 10*3/uL — ABNORMAL HIGH (ref 4.0–10.5)
nRBC: 0 % (ref 0.0–0.2)

## 2021-03-29 LAB — BASIC METABOLIC PANEL
Anion gap: 16 — ABNORMAL HIGH (ref 5–15)
BUN: 102 mg/dL — ABNORMAL HIGH (ref 6–20)
CO2: 23 mmol/L (ref 22–32)
Calcium: 8.3 mg/dL — ABNORMAL LOW (ref 8.9–10.3)
Chloride: 88 mmol/L — ABNORMAL LOW (ref 98–111)
Creatinine, Ser: 4.89 mg/dL — ABNORMAL HIGH (ref 0.61–1.24)
GFR, Estimated: 13 mL/min — ABNORMAL LOW (ref >60)
Glucose, Bld: 447 mg/dL — ABNORMAL HIGH (ref 70–99)
Potassium: 3.3 mmol/L — ABNORMAL LOW (ref 3.5–5.1)
Sodium: 127 mmol/L — ABNORMAL LOW (ref 135–145)

## 2021-03-29 LAB — HEMOGLOBIN A1C
Hgb A1c MFr Bld: 8.2 % — ABNORMAL HIGH (ref 4.8–5.6)
Mean Plasma Glucose: 188.64 mg/dL

## 2021-03-29 LAB — BLOOD CULTURE ID PANEL (REFLEXED) - BCID2

## 2021-03-29 LAB — PROTIME-INR
INR: 1.2 (ref 0.8–1.2)
Prothrombin Time: 15.4 seconds — ABNORMAL HIGH (ref 11.4–15.2)

## 2021-03-29 LAB — COMPREHENSIVE METABOLIC PANEL
ALT: 14 U/L (ref 0–44)
AST: 25 U/L (ref 15–41)
Albumin: 3.7 g/dL (ref 3.5–5.0)
Alkaline Phosphatase: 71 U/L (ref 38–126)
Anion gap: 15 (ref 5–15)
BUN: 98 mg/dL — ABNORMAL HIGH (ref 6–20)
CO2: 26 mmol/L (ref 22–32)
Calcium: 8.9 mg/dL (ref 8.9–10.3)
Chloride: 88 mmol/L — ABNORMAL LOW (ref 98–111)
Creatinine, Ser: 4.78 mg/dL — ABNORMAL HIGH (ref 0.61–1.24)
GFR, Estimated: 13 mL/min — ABNORMAL LOW (ref >60)
Glucose, Bld: 257 mg/dL — ABNORMAL HIGH (ref 70–99)
Potassium: 3.7 mmol/L (ref 3.5–5.1)
Sodium: 129 mmol/L — ABNORMAL LOW (ref 135–145)
Total Bilirubin: 0.6 mg/dL (ref 0.3–1.2)
Total Protein: 9 g/dL — ABNORMAL HIGH (ref 6.5–8.1)

## 2021-03-29 LAB — URINALYSIS, ROUTINE W REFLEX MICROSCOPIC
Bilirubin Urine: NEGATIVE
Glucose, UA: NEGATIVE mg/dL
Hgb urine dipstick: NEGATIVE
Ketones, ur: NEGATIVE mg/dL
Leukocytes,Ua: NEGATIVE
Nitrite: NEGATIVE
Protein, ur: NEGATIVE mg/dL
Specific Gravity, Urine: 1.01 (ref 1.005–1.030)
pH: 5 (ref 5.0–8.0)

## 2021-03-29 LAB — CBC
HCT: 30.5 % — ABNORMAL LOW (ref 39.0–52.0)
Hemoglobin: 9.8 g/dL — ABNORMAL LOW (ref 13.0–17.0)
MCH: 29.7 pg (ref 26.0–34.0)
MCHC: 32.1 g/dL (ref 30.0–36.0)
MCV: 92.4 fL (ref 80.0–100.0)
Platelets: 112 10*3/uL — ABNORMAL LOW (ref 150–400)
RBC: 3.3 MIL/uL — ABNORMAL LOW (ref 4.22–5.81)
RDW: 17.5 % — ABNORMAL HIGH (ref 11.5–15.5)
WBC: 12.6 10*3/uL — ABNORMAL HIGH (ref 4.0–10.5)
nRBC: 0 % (ref 0.0–0.2)

## 2021-03-29 LAB — HEPATITIS PANEL, ACUTE
HCV Ab: NONREACTIVE
Hep A IgM: NONREACTIVE
Hep B C IgM: NONREACTIVE
Hepatitis B Surface Ag: NONREACTIVE

## 2021-03-29 LAB — CBG MONITORING, ED: Glucose-Capillary: 302 mg/dL — ABNORMAL HIGH (ref 70–99)

## 2021-03-29 LAB — LACTIC ACID, PLASMA: Lactic Acid, Venous: 1.3 mmol/L (ref 0.5–1.9)

## 2021-03-29 LAB — APTT: aPTT: 46 seconds — ABNORMAL HIGH (ref 24–36)

## 2021-03-29 LAB — HIV ANTIBODY (ROUTINE TESTING W REFLEX): HIV Screen 4th Generation wRfx: NONREACTIVE

## 2021-03-29 MED ORDER — TORSEMIDE 20 MG PO TABS: 100.0000 mg | ORAL_TABLET | Freq: Every day | ORAL | Status: DC

## 2021-03-29 MED ORDER — POLYETHYLENE GLYCOL 3350 17 G PO PACK
17.0000 g | PACK | Freq: Every day | ORAL | Status: DC | PRN
  Administered 2021-03-31 – 2021-04-01 (×2): 17 g via ORAL
  Filled 2021-03-29 (×2): qty 1

## 2021-03-29 MED ORDER — PANTOPRAZOLE SODIUM 20 MG PO TBEC
20.0000 mg | DELAYED_RELEASE_TABLET | Freq: Every day | ORAL | Status: DC
  Administered 2021-03-29 – 2021-04-03 (×6): 20 mg via ORAL
  Filled 2021-03-29 (×6): qty 1

## 2021-03-29 MED ORDER — TRAZODONE HCL 50 MG PO TABS: 200.0000 mg | ORAL_TABLET | Freq: Every evening | ORAL | Status: DC | PRN

## 2021-03-29 MED ORDER — ACETAMINOPHEN 325 MG PO TABS
650.0000 mg | ORAL_TABLET | Freq: Four times a day (QID) | ORAL | Status: DC | PRN
  Administered 2021-03-29 – 2021-04-05 (×7): 650 mg via ORAL
  Filled 2021-03-29 (×8): qty 2

## 2021-03-29 MED ORDER — LACTATED RINGERS IV SOLN: INTRAVENOUS | Status: DC

## 2021-03-29 MED ORDER — ACETAMINOPHEN 325 MG PO TABS
650.0000 mg | ORAL_TABLET | Freq: Once | ORAL | Status: AC
  Administered 2021-03-29: 650 mg via ORAL
  Filled 2021-03-29: qty 2

## 2021-03-29 MED ORDER — SODIUM CHLORIDE 0.9 % IV SOLN: 2.0000 g | INTRAVENOUS | Status: DC

## 2021-03-29 MED ORDER — GABAPENTIN 300 MG PO CAPS
300.0000 mg | ORAL_CAPSULE | Freq: Every day | ORAL | Status: DC
  Filled 2021-03-29: qty 1

## 2021-03-29 MED ORDER — INSULIN ASPART 100 UNIT/ML IJ SOLN
0.0000 [IU] | Freq: Three times a day (TID) | INTRAMUSCULAR | Status: DC
  Administered 2021-03-29: 15 [IU] via SUBCUTANEOUS
  Administered 2021-03-29: 5 [IU] via SUBCUTANEOUS
  Administered 2021-03-29: 15 [IU] via SUBCUTANEOUS
  Administered 2021-03-30: 11 [IU] via SUBCUTANEOUS
  Administered 2021-03-30: 15 [IU] via SUBCUTANEOUS
  Administered 2021-03-30: 8 [IU] via SUBCUTANEOUS
  Administered 2021-03-31 (×2): 11 [IU] via SUBCUTANEOUS
  Administered 2021-03-31: 3 [IU] via SUBCUTANEOUS
  Administered 2021-04-01: 8 [IU] via SUBCUTANEOUS
  Administered 2021-04-01: 2 [IU] via SUBCUTANEOUS
  Administered 2021-04-02: 15 [IU] via SUBCUTANEOUS
  Administered 2021-04-03 (×3): 8 [IU] via SUBCUTANEOUS
  Administered 2021-04-04 (×3): 11 [IU] via SUBCUTANEOUS
  Administered 2021-04-05: 5 [IU] via SUBCUTANEOUS
  Administered 2021-04-05: 3 [IU] via SUBCUTANEOUS
  Administered 2021-04-06 (×2): 8 [IU] via SUBCUTANEOUS
  Administered 2021-04-07: 10 [IU] via SUBCUTANEOUS
  Administered 2021-04-08: 3 [IU] via SUBCUTANEOUS
  Administered 2021-04-08: 8 [IU] via SUBCUTANEOUS
  Administered 2021-04-09: 11 [IU] via SUBCUTANEOUS
  Administered 2021-04-10: 3 [IU] via SUBCUTANEOUS
  Administered 2021-04-10: 8 [IU] via SUBCUTANEOUS
  Administered 2021-04-10 – 2021-04-11 (×2): 11 [IU] via SUBCUTANEOUS
  Administered 2021-04-11: 5 [IU] via SUBCUTANEOUS
  Administered 2021-04-11: 8 [IU] via SUBCUTANEOUS
  Administered 2021-04-12: 3 [IU] via SUBCUTANEOUS
  Administered 2021-04-12: 8 [IU] via SUBCUTANEOUS
  Administered 2021-04-13: 3 [IU] via SUBCUTANEOUS
  Administered 2021-04-13 (×2): 11 [IU] via SUBCUTANEOUS
  Administered 2021-04-14: 2 [IU] via SUBCUTANEOUS
  Filled 2021-03-29: qty 1

## 2021-03-29 MED ORDER — CALCIUM ACETATE (PHOS BINDER) 667 MG PO CAPS
1334.0000 mg | ORAL_CAPSULE | Freq: Three times a day (TID) | ORAL | Status: DC
  Administered 2021-03-30: 1334 mg via ORAL
  Filled 2021-03-29: qty 2

## 2021-03-29 MED ORDER — INSULIN ASPART 100 UNIT/ML IJ SOLN
0.0000 [IU] | Freq: Every day | INTRAMUSCULAR | Status: DC
  Administered 2021-03-29: 3 [IU] via SUBCUTANEOUS
  Administered 2021-03-29 – 2021-04-03 (×3): 4 [IU] via SUBCUTANEOUS
  Administered 2021-04-04: 2 [IU] via SUBCUTANEOUS
  Administered 2021-04-06: 5 [IU] via SUBCUTANEOUS
  Administered 2021-04-07: 4 [IU] via SUBCUTANEOUS

## 2021-03-29 MED ORDER — MORPHINE SULFATE (PF) 2 MG/ML IV SOLN: 2.0000 mg | INTRAVENOUS | Status: DC | PRN

## 2021-03-29 MED ORDER — LIDOCAINE-EPINEPHRINE (PF) 2 %-1:200000 IJ SOLN
20.0000 mL | Freq: Once | INTRAMUSCULAR | Status: DC
  Filled 2021-03-29: qty 20

## 2021-03-29 MED ORDER — HEPARIN SODIUM (PORCINE) 5000 UNIT/ML IJ SOLN
5000.0000 [IU] | Freq: Three times a day (TID) | INTRAMUSCULAR | Status: DC
  Administered 2021-03-29 – 2021-04-05 (×17): 5000 [IU] via SUBCUTANEOUS
  Filled 2021-03-29 (×18): qty 1

## 2021-03-29 MED ORDER — AMLODIPINE BESYLATE 5 MG PO TABS
5.0000 mg | ORAL_TABLET | Freq: Every day | ORAL | Status: DC
  Administered 2021-03-29 – 2021-03-30 (×3): 5 mg via ORAL
  Filled 2021-03-29 (×4): qty 1

## 2021-03-29 MED ORDER — SERTRALINE HCL 100 MG PO TABS
100.0000 mg | ORAL_TABLET | Freq: Every day | ORAL | Status: DC
  Administered 2021-03-29 – 2021-04-13 (×16): 100 mg via ORAL
  Filled 2021-03-29 (×18): qty 1

## 2021-03-29 MED ORDER — VANCOMYCIN HCL IN DEXTROSE 1-5 GM/200ML-% IV SOLN
INTRAVENOUS | Status: AC
  Administered 2021-03-29: 1000 mg via INTRAVENOUS
  Filled 2021-03-29: qty 200

## 2021-03-29 MED ORDER — LORAZEPAM 2 MG/ML IJ SOLN: 1.0000 mg | Freq: Once | INTRAMUSCULAR | Status: DC | PRN

## 2021-03-29 MED ORDER — DIAZEPAM 5 MG PO TABS: 5.0000 mg | ORAL_TABLET | Freq: Two times a day (BID) | ORAL | Status: DC | PRN

## 2021-03-29 MED ORDER — ATORVASTATIN CALCIUM 40 MG PO TABS
40.0000 mg | ORAL_TABLET | Freq: Every day | ORAL | Status: DC
  Administered 2021-03-29 – 2021-04-13 (×17): 40 mg via ORAL
  Filled 2021-03-29 (×17): qty 1

## 2021-03-29 MED ORDER — CARVEDILOL 12.5 MG PO TABS
12.5000 mg | ORAL_TABLET | Freq: Two times a day (BID) | ORAL | Status: DC
  Administered 2021-03-29 – 2021-04-14 (×30): 12.5 mg via ORAL
  Filled 2021-03-29 (×30): qty 1

## 2021-03-29 MED ORDER — VANCOMYCIN HCL IN DEXTROSE 1-5 GM/200ML-% IV SOLN: 1000.0000 mg | INTRAVENOUS | Status: DC

## 2021-03-29 MED ORDER — HYDROMORPHONE HCL 1 MG/ML IJ SOLN
1.0000 mg | INTRAMUSCULAR | Status: DC | PRN
Start: 2021-03-29 — End: 2021-04-01
  Administered 2021-03-29 – 2021-04-01 (×16): 1 mg via INTRAVENOUS
  Filled 2021-03-29 (×16): qty 1

## 2021-03-29 MED ORDER — ASPIRIN 81 MG PO CHEW
81.0000 mg | CHEWABLE_TABLET | Freq: Every day | ORAL | Status: DC
  Administered 2021-03-29 – 2021-04-05 (×7): 81 mg via ORAL
  Filled 2021-03-29 (×8): qty 1

## 2021-03-29 MED ORDER — ACETAMINOPHEN 650 MG RE SUPP: 650.0000 mg | Freq: Four times a day (QID) | RECTAL | Status: DC | PRN

## 2021-03-29 MED ORDER — CHLORHEXIDINE GLUCONATE CLOTH 2 % EX PADS
6.0000 | MEDICATED_PAD | Freq: Every day | CUTANEOUS | Status: DC
  Administered 2021-03-29 – 2021-04-09 (×11): 6 via TOPICAL

## 2021-03-29 MED ORDER — INSULIN GLARGINE 100 UNIT/ML ~~LOC~~ SOLN
10.0000 [IU] | Freq: Every day | SUBCUTANEOUS | Status: DC
  Administered 2021-03-29: 10 [IU] via SUBCUTANEOUS
  Filled 2021-03-29 (×3): qty 0.1

## 2021-03-29 MED ORDER — NORTRIPTYLINE HCL 10 MG PO CAPS: 20.0000 mg | ORAL_CAPSULE | Freq: Every day | ORAL | Status: DC

## 2021-03-29 MED ORDER — ACETAMINOPHEN 500 MG PO TABS: 1000.0000 mg | ORAL_TABLET | Freq: Once | ORAL | Status: DC

## 2021-03-29 MED ORDER — METOLAZONE 5 MG PO TABS: 10.0000 mg | ORAL_TABLET | Freq: Every day | ORAL | Status: DC

## 2021-03-29 NOTE — ED Notes (Signed)
Provider aware of temp a this time

## 2021-03-29 NOTE — ED Notes (Signed)
Provider aware only able to obtain 1 set of bc

## 2021-03-29 NOTE — ED Notes (Signed)
Pt is awake and asking for pain medication. Pt explained why none was ordered due to his mental status. Pt stated that he would stay awake and answer any questions if we would just give him some medication. Provider sent a message

## 2021-03-29 NOTE — Progress Notes (Signed)
   03/29/21 0858  Assess: MEWS Score  Temp (!) 103.2 F (39.6 C)  BP (!) 150/77  Pulse Rate (!) 108  Resp (!) 22  Level of Consciousness Alert  SpO2 98 %  O2 Device Nasal Cannula  O2 Flow Rate (L/min) 3 L/min  Assess: MEWS Score  MEWS Temp 2  MEWS Systolic 0  MEWS Pulse 1  MEWS RR 1  MEWS LOC 0  MEWS Score 4  MEWS Score Color Red  Assess: if the MEWS score is Yellow or Red  Were vital signs taken at a resting state? Yes  Focused Assessment No change from prior assessment  Early Detection of Sepsis Score *See Row Information* Medium  MEWS guidelines implemented *See Row Information* Yes  Treat  MEWS Interventions Administered scheduled meds/treatments  Pain Scale Faces  Pain Score 4  Pain Type Chronic pain  Pain Intervention(s) Medication (See eMAR)  Take Vital Signs  Increase Vital Sign Frequency  Red: Q 1hr X 4 then Q 4hr X 4, if remains red, continue Q 4hrs  Escalate  MEWS: Escalate Red: discuss with charge nurse/RN and provider, consider discussing with RRT  Notify: Charge Nurse/RN  Name of Charge Nurse/RN Notified Elisa RN  Date Charge Nurse/RN Notified 03/29/21  Time Charge Nurse/RN Notified 0900  Notify: Provider  Provider Name/Title Claria Dice, MD  Date Provider Notified 03/29/21  Time Provider Notified [phone]  Notification Type Page  Notification Reason Other (Comment)  Document  Patient Outcome Stabilized after interventions  Progress note created (see row info) Yes

## 2021-03-29 NOTE — ED Notes (Signed)
Turned to lt side at this time

## 2021-03-29 NOTE — ED Notes (Signed)
Pharm tech at bedside at this time

## 2021-03-29 NOTE — ED Notes (Signed)
Provider made aware of current temp at this time

## 2021-03-29 NOTE — ED Notes (Signed)
Provider at bedside

## 2021-03-29 NOTE — ED Notes (Signed)
resp at bedside at this time for abg

## 2021-03-29 NOTE — Progress Notes (Signed)
gpc in clusters 2/4 aer/anaer SA, mecA+PHARMACY - PHYSICIAN COMMUNICATION CRITICAL VALUE ALERT - BLOOD CULTURE IDENTIFICATION (BCID)  Jeffrey [name] is an 60 y.o. male who presented to [facility] on 03/28/2021  Assessment:  44 yom with ESRD on HD presenting with fever, vomiting, diaphoresis, mild confusion, L knee pain. Noted history of knee replacement/hardware bilaterally, s/p arthrocentesis in ER 5/2. Now 2/4 BCx bottles growing MRSA.  Name of physician (or Provider) Contacted: Darrick Meigs, G  Current antibiotics: vancomycin/cefepime  Changes to prescribed antibiotics recommended:  Recommendations accepted by provider - d/c cefepime, continue vancomycin  Results for orders placed or performed during the hospital encounter of 03/28/21  Blood Culture ID Panel (Reflexed) (Collected: 03/28/2021 11:30 PM)  Result Value Ref Range   Enterococcus faecalis NOT DETECTED NOT DETECTED   Enterococcus Faecium NOT DETECTED NOT DETECTED   Listeria monocytogenes NOT DETECTED NOT DETECTED   Staphylococcus species DETECTED (A) NOT DETECTED   Staphylococcus aureus (BCID) DETECTED (A) NOT DETECTED   Staphylococcus epidermidis NOT DETECTED NOT DETECTED   Staphylococcus lugdunensis NOT DETECTED NOT DETECTED   Streptococcus species NOT DETECTED NOT DETECTED   Streptococcus agalactiae NOT DETECTED NOT DETECTED   Streptococcus pneumoniae NOT DETECTED NOT DETECTED   Streptococcus pyogenes NOT DETECTED NOT DETECTED   A.calcoaceticus-baumannii NOT DETECTED NOT DETECTED   Bacteroides fragilis NOT DETECTED NOT DETECTED   Enterobacterales NOT DETECTED NOT DETECTED   Enterobacter cloacae complex NOT DETECTED NOT DETECTED   Escherichia coli NOT DETECTED NOT DETECTED   Klebsiella aerogenes NOT DETECTED NOT DETECTED   Klebsiella oxytoca NOT DETECTED NOT DETECTED   Klebsiella pneumoniae NOT DETECTED NOT DETECTED   Proteus species NOT DETECTED NOT DETECTED   Salmonella species NOT DETECTED NOT DETECTED   Serratia marcescens  NOT DETECTED NOT DETECTED   Haemophilus influenzae NOT DETECTED NOT DETECTED   Neisseria meningitidis NOT DETECTED NOT DETECTED   Pseudomonas aeruginosa NOT DETECTED NOT DETECTED   Stenotrophomonas maltophilia NOT DETECTED NOT DETECTED   Candida albicans NOT DETECTED NOT DETECTED   Candida auris NOT DETECTED NOT DETECTED   Candida glabrata NOT DETECTED NOT DETECTED   Candida krusei NOT DETECTED NOT DETECTED   Candida parapsilosis NOT DETECTED NOT DETECTED   Candida tropicalis NOT DETECTED NOT DETECTED   Cryptococcus neoformans/gattii NOT DETECTED NOT DETECTED   Meth resistant mecA/C and MREJ DETECTED (A) NOT DETECTED    Arturo Morton, PharmD, BCPS Please check AMION for all [place] contact numbers Clinical Pharmacist 03/29/2021 3:38 PM

## 2021-03-29 NOTE — Procedures (Signed)
   I was present at this dialysis session, have reviewed the session itself and made  appropriate changes Kelly Splinter MD [place] pager [phone]   03/29/2021, 2:56 PM

## 2021-03-29 NOTE — ED Notes (Signed)
Attempted to give pt water and pt started coughing and spitting up the water. Pt advised if he continues to do that we will not be able to give him anything to drink. Per pt he did this all day yesterday as well.

## 2021-03-29 NOTE — Consult Note (Signed)
Renal Service Consult Note [facility]  Jeffrey [name] 03/29/2021 Sol Blazing, MD Requesting Physician: Dr Claria Dice, D.   Reason for Consult: ESRD pt w/ fevers, chills HPI: The patient is a 60 y.o. year-old w/ hx of ESRD on HD, DM2, HTN, COPD presented to ED w/ nausea, vomiting, fevers, chills, sweating and mild confusion. Also L knee pain (hx TKR). Recent AVF placed in R arm, and says they have been using it and he has appt for removal of the HD cath on Tuesday 5/3 (tomorrow). Pt gets HD on MWF.  In temp was 103 deg, WBC 12K Hb 9.8 K 3.3 BUN 102 Cr 4.89  Ca 8.3  UA negative. Asked to see for dialysis.   Pt seen in room. Has had "multiple" HD cath infections.  He gets access [facility] at [facility]. Main c/o is chills and feeling "bad" overall. No SOB, prod cough, some mild chest discomfort.  No pus drainage from HD cath. Pt states they have been using the graft RUE w/ 2 needles and are planning to remove the catheter.   Per OP HD RN at [facility] they started using the R AVG on 4/5, they have been using 16ga needles, but sometimes use the [facility], per RN the thought was pt had been refusing sometimes to use AVG.     ROS  denies CP  no joint pain   no HA  no blurry vision  no rash  no diarrhea  no nausea/ vomiting    Past Medical History  Past Medical History:  Diagnosis Date  . Arthritis   . Blood transfusion without reported diagnosis   . CHF (congestive heart failure) ([place])   . COPD (chronic obstructive pulmonary disease) ([place])   . Diabetes mellitus without complication ([place])   . Hypertension   . PTSD (post-traumatic stress disorder)   . Renal disorder    Stage 5 Kidney Failure   Past Surgical History  Past Surgical History:  Procedure Laterality Date  . JOINT REPLACEMENT     Bilateral knees   Family History History reviewed. No pertinent family history. Social History  reports that he quit smoking about 10 years ago. His smoking use included  cigarettes. He has never used smokeless tobacco. He reports current alcohol use. He reports previous drug use. Drug: Marijuana. Allergies  Allergies  Allergen Reactions  . Bupropion Swelling  . Dexmedetomidine Nausea And Vomiting    Other reaction(s): Other (See Comments) Dose-limiting bradycardia Dose-limiting bradycardia   . Ibuprofen Shortness Of Breath and Swelling    Pt tolerates aspirin Pt tolerates aspirin   . Pioglitazone Anaphylaxis and Rash  . Tomato Anaphylaxis   Home medications Prior to Admission medications   Medication Sig Start Date End Date Taking? Authorizing Provider  ACCU-CHEK GUIDE test strip 4 (four) times daily. 11/15/20  Yes [provider]  albuterol (VENTOLIN HFA) 108 (90 Base) MCG/ACT inhaler Inhale 1 puff into the lungs every 6 (six) hours as needed for wheezing or shortness of breath. 03/14/21  Yes [provider]  amLODipine (NORVASC) 5 MG tablet Take 5 mg by mouth at bedtime. 04/17/20  Yes [provider]  ASPIRIN LOW DOSE 81 MG EC tablet Take 81 mg by mouth daily. 02/22/21  Yes [provider]  atorvastatin (LIPITOR) 40 MG tablet Take 40 mg by mouth daily. 02/09/21  Yes [provider]  calcium acetate (PHOSLO) 667 MG capsule Take 667 mg by mouth with breakfast, with lunch, and with evening meal. 10/02/19  Yes [provider]  carvedilol (COREG) 12.5 MG tablet Take 12.5 mg by mouth 2 (two) times daily with a meal. 04/17/20  Yes [provider]  Cholecalciferol 50 MCG (2000 UT) CAPS Take 2,000 Units by mouth daily. 08/24/20  Yes [provider]  clotrimazole (LOTRIMIN) 1 % cream Apply 1 application topically 2 (two) times daily as needed (rash on feet).   Yes [provider]  Continuous Blood Gluc Receiver (DEXCOM G6 RECEIVER) DEVI Use 1 Device continuously 03/08/21  Yes [provider]  Continuous Blood Gluc Sensor (DEXCOM G6 SENSOR) MISC Use 1 each every 10 (ten) days  03/08/21  Yes [provider]  Continuous Blood Gluc Transmit (DEXCOM G6 TRANSMITTER) MISC Use 1 each every 3 (three) months 03/08/21  Yes [provider]  Cysteamine Bitartrate (PROCYSBI) 300 MG PACK accucheck guide meter 03/09/20  Yes [provider]  diazepam (VALIUM) 5 MG tablet Take 5 mg by mouth 2 (two) times daily as needed for anxiety. 02/22/21  Yes [provider]  diphenhydrAMINE (BENADRYL) 25 MG tablet Take 50 mg by mouth every 6 (six) hours as needed for allergies.   Yes [provider]  gabapentin (NEURONTIN) 300 MG capsule Take 300 mg by mouth at bedtime. 02/22/21  Yes [provider]  glucose blood (PRECISION QID TEST) test strip 4 times daily accucheck guide meter 09/28/20  Yes [provider]  insulin regular human CONCENTRATED (HUMULIN R U-500 KWIKPEN) 500 UNIT/ML kwikpen Inject 65-120 Units into the skin See admin instructions. 120 units before breakfast and 65 units before dinner 09/28/20  Yes [provider]  metolazone (ZAROXOLYN) 10 MG tablet Take 10 mg by mouth daily. 03/01/21  Yes [provider]  nortriptyline (PAMELOR) 10 MG capsule Take 20 mg by mouth at bedtime. 10/23/19 02/22/22 Yes [provider]  Oxymetazoline HCl (NASAL SPRAY) 0.05 % SOLN Place 1 spray into the nose daily as needed (congestion).   Yes [provider]  pantoprazole (PROTONIX) 20 MG tablet Take 20 mg by mouth daily. 02/09/21  Yes [provider]  polyethylene glycol powder (GLYCOLAX/MIRALAX) 17 GM/SCOOP powder Take 17 g by mouth daily as needed for mild constipation. 08/24/20  Yes [provider]  senna-docusate (SENOKOT-S) 8.6-50 MG tablet Take 2 tablets by mouth in the morning and at bedtime. 10/23/19  Yes [provider]  sertraline (ZOLOFT) 100 MG tablet Take 100 mg by mouth daily. 02/22/21  Yes [provider]  torsemide (DEMADEX) 20 MG tablet Take 100 mg by mouth in the morning  and at bedtime. 10/23/19  Yes [provider]  traZODone (DESYREL) 100 MG tablet Take 400 mg by mouth at bedtime. 02/22/21  Yes [provider]     Vitals:   03/29/21 0858 03/29/21 0956 03/29/21 1100 03/29/21 1208  BP: (!) 150/77 (!) 144/67 (!) 144/77 (!) 159/54  Pulse: (!) 108 (!) 111 96 91  Resp: (!) 22 (!) 25 (!) 23 (!) 22  Temp: (!) 103.2 F (39.6 C) (!) 102.7 F (39.3 C) (!) 102.1 F (38.9 C) (!) 101.1 F (38.4 C)  TempSrc: Oral   Other (Comment)  SpO2: 98% 98% 97% 100%  Weight:      Height:       Exam Gen obese AAM, appears uncomfortable, no ^wob , no chills No rash, cyanosis or gangrene Sclera anicteric, throat clear  No jvd or bruits Chest clear bilat to base RRR no MRG Abd soft ntnd no mass or ascites +bs GU normal  MS L knee well-healed scars, mild edema and joint painful to touch Ext no UR or LE edema, no wounds or ulcers Neuro is alert, Ox 3 , nf R IJ TDC  / RUA AVG+bruit    Home meds:  - norvasc 5 hs/ coreg 12.5 bid/ zaroxolyn 10 qd/ demadex 100 bid  - trazodone 200 hs prn/ zoloft 100 qd/ nortriptyline 20 hs/ neurontin 300 hs/ valium 5 bid prn  - asa 81 / lipitor 40  - phoslo 2 ac tid/ protonix 20 qd  - prn's/ vitamins/ supplements     OP HD: [facility] MWF    4h  121kg  3K/2.5 bath  RUE AVG/ R IJ TDC  Hep 4500+ 2537md  - mircera 100 ug q2 last 4/27     Na 127  K 3.3 CO23  BUN 102  Cr 4.89  WBC 12K Hb 9.8     BP 159/60  HR 110- 125  Temp 102- 103   RR 18-25    CXR 5/01 - IMPRESSION: Low volumes with basilar opacities which may reflect atelectasis though edema or infection could have a similar appearance in the appropriate clinical setting.  Assessment/ Plan: 1. Fevers/ chills - high fever in ED 103 deg. HD cath sepsis and/or septic joint L knee, getting empiric IV abx and blood cx's pending.  2. ESRD - on HD MWF.  [facility]14 that showed patent AVG.   4. HTN/ vol - up by wt's, no edema on CXR. Nasal O2, not sure if uses O2 at home. BP's, large UF goal today w/ HD if BP will tolerate.  5. PTSD - on zoloft and others 6. COPD - per pmd 7. DM2 - per pmd, not on medication per pta list 8. Anemia ckd - last esa on 4/27, due on 5/11 if still here.  9. MBD ckd - Ca ok, cont phoslo, not on vdra      Rob Llesenia Fogal  MD 03/29/2021, 12:31 PM  Recent Labs  Lab 03/28/21 2149 03/29/21 1050  WBC 12.3* 12.6*  HGB 10.5* 9.8*   Recent Labs  Lab 03/28/21 2149 03/29/21 1050  K 3.7 3.3*  BUN 98* 102*  CREATININE 4.78* 4.89*  CALCIUM 8.9 8.3*

## 2021-03-29 NOTE — Progress Notes (Signed)
This T at bedside for ABG.  MD at bedside> Discontinued order.

## 2021-03-29 NOTE — ED Notes (Signed)
1 set of blood cultures were collected. Multiple failed attempts for second set. Provider states it is okay to start abx at this time. Will walk aspirated knee fluid to lab.

## 2021-03-29 NOTE — Consult Note (Signed)
Reason for Consult:Left knee pain Referring Physician: Georgiann [name] Time called: [id number] Time at bedside: [place]   Jeffrey [name] is an 60 y.o. male.  HPI: Jeffrey [name] was admitted yesterday with fevers and long history of infected dialysis catheters. In addition he had chronic left knee pain that has gotten acutely worse in the last few days. It's to the point now where he can't move it or bear weight. 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 He continues to have significant problems but doesn't feel the MD's at [facility] take him seriously. It appears they discussed another revision but only if he lost significant weight and also discussed amputation.  Past Medical History:  Diagnosis Date  . Arthritis   . Blood transfusion without reported diagnosis   . CHF (congestive heart failure) ([place])   . COPD (chronic obstructive pulmonary disease) ([place])   . Diabetes mellitus without complication ([place])   . Hypertension   . PTSD (post-traumatic stress disorder)   . Renal disorder    Stage 5 Kidney Failure    Past Surgical History:  Procedure Laterality Date  . JOINT REPLACEMENT     Bilateral knees    History reviewed. No pertinent family history.  Social History:  reports that he quit smoking about 10 years ago. His smoking use included cigarettes. He has never used smokeless tobacco. He reports current alcohol use. He reports previous drug use. Drug: Marijuana.  Allergies:  Allergies  Allergen Reactions  . Bupropion Swelling  . Dexmedetomidine Nausea And Vomiting    Other reaction(s): Other (See Comments) Dose-limiting bradycardia Dose-limiting bradycardia   . Ibuprofen Shortness Of Breath and Swelling    Pt tolerates aspirin Pt tolerates aspirin   . Pioglitazone Anaphylaxis and Rash  . Tomato Anaphylaxis    Medications: I have reviewed the patient's current medications.  Results for orders placed or performed during the hospital encounter of 03/28/21 (from the past 48  hour(s))  Comprehensive metabolic panel     Status: Abnormal   Collection Time: 03/28/21  9:49 PM  Result Value Ref Range   Sodium 129 (L) 135 - 145 mmol/L   Potassium 3.7 3.5 - 5.1 mmol/L   Chloride 88 (L) 98 - 111 mmol/L   CO2 26 22 - 32 mmol/L   Glucose, Bld 257 (H) 70 - 99 mg/dL    Comment: Glucose reference range applies only to samples taken after fasting for at least 8 hours.   BUN 98 (H) 6 - 20 mg/dL   Creatinine, Ser 4.78 (H) 0.61 - 1.24 mg/dL   Calcium 8.9 8.9 - 10.3 mg/dL   Total Protein 9.0 (H) 6.5 - 8.1 g/dL   Albumin 3.7 3.5 - 5.0 g/dL   AST 25 15 - 41 U/L   ALT 14 0 - 44 U/L   Alkaline Phosphatase 71 38 - 126 U/L   Total Bilirubin 0.6 0.3 - 1.2 mg/dL   GFR, Estimated 13 (L) >60 mL/min    Comment: (NOTE) Calculated using the CKD-EPI Creatinine Equation (2021)    Anion gap 15 5 - 15    Comment: Performed at [facility], [place] [address], [place], [place] [address]  CBC WITH DIFFERENTIAL     Status: Abnormal   Collection Time: 03/28/21  9:49 PM  Result Value Ref Range   WBC 12.3 (H) 4.0 - 10.5 K/uL   RBC 3.57 (L) 4.22 - 5.81 MIL/uL   Hemoglobin 10.5 (L) 13.0 - 17.0 g/dL   HCT 32.5 (  L) 39.0 - 52.0 %   MCV 91.0 80.0 - 100.0 fL   MCH 29.4 26.0 - 34.0 pg   MCHC 32.3 30.0 - 36.0 g/dL   RDW 17.2 (H) 11.5 - 15.5 %   Platelets 139 (L) 150 - 400 K/uL   nRBC 0.0 0.0 - 0.2 %   Neutrophils Relative % 87 %   Neutro Abs 10.8 (H) 1.7 - 7.7 K/uL   Lymphocytes Relative 4 %   Lymphs Abs 0.4 (L) 0.7 - 4.0 K/uL   Monocytes Relative 8 %   Monocytes Absolute 0.9 0.1 - 1.0 K/uL   Eosinophils Relative 0 %   Eosinophils Absolute 0.0 0.0 - 0.5 K/uL   Basophils Relative 0 %   Basophils Absolute 0.0 0.0 - 0.1 K/uL   Immature Granulocytes 1 %   Abs Immature Granulocytes 0.15 (H) 0.00 - 0.07 K/uL    Comment: Performed at [facility] [address], [place], [place] [address]  Protime-INR     Status: Abnormal   Collection Time: 03/28/21  9:49 PM  Result Value Ref Range    Prothrombin Time 15.4 (H) 11.4 - 15.2 seconds   INR 1.2 0.8 - 1.2    Comment: (NOTE) INR goal varies based on device and disease states. Performed at [facility], [place] [address], [place], [place] [address]   APTT     Status: Abnormal   Collection Time: 03/28/21  9:49 PM  Result Value Ref Range   aPTT 46 (H) 24 - 36 seconds    Comment:        IF BASELINE aPTT IS ELEVATED, SUGGEST PATIENT RISK ASSESSMENT BE USED TO DETERMINE APPROPRIATE ANTICOAGULANT THERAPY. Performed at [facility], [place] [address] [place], [place] [address]   Resp Panel by RT-PCR (Flu A&B, Covid) Nasopharyngeal Swab     Status: None   Collection Time: 03/28/21 10:35 PM   Specimen: Nasopharyngeal Swab; Nasopharyngeal(NP) swabs in vial transport medium  Result Value Ref Range   SARS Coronavirus 2 by RT PCR NEGATIVE NEGATIVE    Comment: (NOTE) SARS-CoV-2 target nucleic acids are NOT DETECTED.  The SARS-CoV-2 RNA is generally detectable in upper respiratory specimens during the acute phase of infection. The lowest concentration of SARS-CoV-2 viral copies this assay can detect is 138 copies/mL. A negative result does not preclude SARS-Cov-2 infection and should not be used as the sole basis for treatment or other patient management decisions. A negative result may occur with  improper specimen collection/handling, submission of specimen other than nasopharyngeal swab, presence of viral mutation(s) within the areas targeted by this assay, and inadequate number of viral copies(<138 copies/mL). A negative result must be combined with clinical observations, patient history, and epidemiological information. The expected result is Negative.  Fact Sheet for Patients:  EntrepreneurPulse.com.au  Fact Sheet for Healthcare Providers:  IncredibleEmployment.be  This test is no t yet approved or cleared by the [place] FDA and  has been authorized for detection  and/or diagnosis of SARS-CoV-2 by FDA under an Emergency Use Authorization (EUA). This EUA will remain  in effect (meaning this test can be used) for the duration of the COVID-19 declaration under Section 564(b)(1) of the Act, 21 U.S.C.section 360bbb-3(b)(1), unless the authorization is terminated  or revoked sooner.       Influenza A by PCR NEGATIVE NEGATIVE   Influenza B by PCR NEGATIVE NEGATIVE    Comment: (NOTE) The Xpert Xpress SARS-CoV-2/FLU/RSV plus assay is intended as an aid in  the diagnosis of influenza from Nasopharyngeal swab specimens and should not be used as a sole basis for treatment. Nasal washings and aspirates are unacceptable for Xpert Xpress SARS-CoV-2/FLU/RSV testing.  Fact Sheet for Patients: EntrepreneurPulse.com.au  Fact Sheet for Healthcare Providers: IncredibleEmployment.be  This test is not yet approved or cleared by the [place] FDA and has been authorized for detection and/or diagnosis of SARS-CoV-2 by FDA under an Emergency Use Authorization (EUA). This EUA will remain in effect (meaning this test can be used) for the duration of the COVID-19 declaration under Section 564(b)(1) of the Act, 21 U.S.C. section 360bbb-3(b)(1), unless the authorization is terminated or revoked.  Performed at [facility], [place] [address], [place], [place] [address]   Blood Culture (routine x 2)     Status: None (Preliminary result)   Collection Time: 03/28/21 11:30 PM   Specimen: BLOOD LEFT FOREARM  Result Value Ref Range   Specimen Description BLOOD LEFT FOREARM    Special Requests      BOTTLES DRAWN AEROBIC AND ANAEROBIC Blood Culture results may not be optimal due to an inadequate volume of blood received in culture bottles   Culture      NO GROWTH < 12 HOURS Performed at [facility] [address] [place], [place] [address]    Report Status PENDING   Lactic acid, plasma     Status: None   Collection  Time: 03/28/21 11:58 PM  Result Value Ref Range   Lactic Acid, Venous 1.3 0.5 - 1.9 mmol/L    Comment: Performed at [facility], [place] [address], [place], [place] [address]  Body fluid culture w Gram Stain     Status: None (Preliminary result)   Collection Time: 03/29/21 12:10 AM   Specimen: Body Fluid  Result Value Ref Range   Specimen Description FLUID LEFT KNEE    Special Requests SYRINGE    Gram Stain      FEW WBC SEEN NO ORGANISMS SEEN Performed at [facility], [address], [place], [place] [address]    Culture PENDING    Report Status PENDING   Urinalysis, Routine w reflex microscopic Urine, Clean Catch     Status: Abnormal   Collection Time: 03/29/21  1:00 AM  Result Value Ref Range   Color, Urine STRAW (A) YELLOW   APPearance CLEAR CLEAR   Specific Gravity, Urine 1.010 1.005 - 1.030   pH 5.0 5.0 - 8.0   Glucose, UA NEGATIVE NEGATIVE mg/dL   Hgb urine dipstick NEGATIVE NEGATIVE   Bilirubin Urine NEGATIVE NEGATIVE   Ketones, ur NEGATIVE NEGATIVE mg/dL   Protein, ur NEGATIVE NEGATIVE mg/dL   Nitrite NEGATIVE NEGATIVE   Leukocytes,Ua NEGATIVE NEGATIVE    Comment: Performed at [facility] [address] [place], [place] [address]  Hemoglobin A1c     Status: Abnormal   Collection Time: 03/29/21  1:57 AM  Result Value Ref Range   Hgb A1c MFr Bld 8.2 (H) 4.8 - 5.6 %    Comment: (NOTE) Pre diabetes:          5.7%-6.4%  Diabetes:              >6.4%  Glycemic control for   <7.0% adults with diabetes    Mean Plasma Glucose 188.64 mg/dL    Comment: Performed at [facility] [address], [place], [place] [address]  CBG monitoring, ED     Status: Abnormal   Collection Time:  03/29/21  2:27 AM  Result Value Ref Range   Glucose-Capillary 302 (H) 70 - 99 mg/dL    Comment: Glucose reference range applies only to samples taken after fasting for at least 8 hours.   Comment 1 Notify RN   Blood Culture (routine x 2)     Status: None (Preliminary  result)   Collection Time: 03/29/21  3:00 AM   Specimen: BLOOD  Result Value Ref Range   Specimen Description BLOOD SITE NOT SPECIFIED    Special Requests      BOTTLES DRAWN AEROBIC AND ANAEROBIC Blood Culture adequate volume   Culture      NO GROWTH < 12 HOURS Performed at [facility], [place] [address], [place], [place] [address]    Report Status PENDING   Glucose, capillary     Status: Abnormal   Collection Time: 03/29/21  9:07 AM  Result Value Ref Range   Glucose-Capillary 397 (H) 70 - 99 mg/dL    Comment: Glucose reference range applies only to samples taken after fasting for at least 8 hours.  Glucose, capillary     Status: Abnormal   Collection Time: 03/29/21  9:55 AM  Result Value Ref Range   Glucose-Capillary 413 (H) 70 - 99 mg/dL    Comment: Glucose reference range applies only to samples taken after fasting for at least 8 hours.    CT KNEE LEFT WO CONTRAST  Result Date: 03/29/2021 CLINICAL DATA:  Fever with generalized pain and possible sepsis. History of total knee arthroplasty, date unknown. Clinical concern for joint infection. EXAM: CT OF THE LEFT KNEE WITHOUT CONTRAST TECHNIQUE: Multidetector CT imaging of the left knee was performed according to the standard protocol. Multiplanar CT image reconstructions were also generated. COMPARISON:  Radiographs 03/28/2021 FINDINGS: Bones/Joint/Cartilage Status post left total knee arthroplasty. There is prominent irregular lucency surrounding the tibial prosthesis which could reflect hardware loosening or infection. No loosening of the femoral hardware identified. The patella is located without gross bone destruction allowing for artifact from the total knee arthroplasty. There is a moderate-sized knee joint effusion with associated capsular calcifications. No intra-articular air identified. Ligaments Suboptimally assessed by CT.  Status post total knee arthroplasty. Muscles and Tendons The extensor mechanism appears intact. The  distal quadriceps tendon appears attenuated. No focal muscular abnormalities are identified. Soft tissues No periarticular fluid collections, unexpected foreign bodies or soft tissue emphysema identified. Aside from the nonspecific joint effusion, no significant periarticular inflammatory changes are seen. IMPRESSION: 1. As demonstrated on preceding radiographs, there is irregular lucency around the tibial prosthesis which may reflect hardware loosening or infection. 2. No loosening of the femoral hardware identified. 3. Nonspecific moderate-sized knee joint effusion. Arthrocentesis recommended if persistent concern of infection. Electronically Signed   By: Richardean Sale M.D.   On: 03/29/2021 08:19   DG Chest Port 1 View  Result Date: 03/28/2021 CLINICAL DATA:  Possible sepsis, fever, generalized pain, possible dialysis catheter infection EXAM: PORTABLE CHEST 1 VIEW COMPARISON:  03/08/2021 FINDINGS: Low lung volumes. Increasing opacities in the mid to lower lungs, could reflect atelectasis though developing edema or atypical infection could have a similar appearance in the appropriate clinical setting. Cardiomegaly with pulmonary vascular congestion. Dual lumen dialysis catheter tip terminates at the right atrium. Telemetry leads overlie the chest. No pneumothorax or visible effusion. No acute osseous or soft tissue abnormality. IMPRESSION: Low volumes with basilar opacities which may reflect atelectasis though edema or infection could have a similar appearance in the appropriate clinical setting.  Right IJ approach dialysis catheter tip terminates at the right atrium. Electronically Signed   By: Lovena Le M.D.   On: 03/28/2021 22:51   DG Knee Complete 4 Views Left  Result Date: 03/28/2021 CLINICAL DATA:  Possible sepsis, fever and generalized pain EXAM: LEFT KNEE - COMPLETE 4+ VIEW COMPARISON:  None. FINDINGS: Extensive circumferential swelling with large joint effusion. Postsurgical changes from prior  tricompartmental left knee arthroplasty with some questionable destructive changes about the articular margins as well as periprosthetic lucency surrounding the tibial component and cemented tibial stem. Additional heterotopic ossification is seen as well. No soft tissue gas. 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 Electronically Signed   By: Lovena Le M.D.   On: 03/28/2021 22:56    Review of Systems  Constitutional: Positive for fever. Negative for chills and diaphoresis.  HENT: Negative for ear discharge, ear pain, hearing loss and tinnitus.   Eyes: Negative for photophobia and pain.  Respiratory: Negative for cough and shortness of breath.   Cardiovascular: Negative for chest pain.  Gastrointestinal: Negative for abdominal pain, nausea and vomiting.  Genitourinary: Negative for dysuria, flank pain, frequency and urgency.  Musculoskeletal: Positive for arthralgias (Left knee) and joint swelling. Negative for back pain, myalgias and neck pain.  Neurological: Negative for dizziness and headaches.  Hematological: Does not bruise/bleed easily.  Psychiatric/Behavioral: The patient is not nervous/anxious.    Blood pressure (!) 144/67, pulse (!) 111, temperature (!) 102.7 F (39.3 C), resp. rate (!) 25, height '5\' 5"'$  (1.651 m), weight 128 kg, SpO2 98 %. Physical Exam Constitutional:      General: He is not in acute distress.    Appearance: He is well-developed. He is not diaphoretic.  HENT:     Head: Normocephalic and atraumatic.  Eyes:     General: No scleral icterus.       Right eye: No discharge.        Left eye: No discharge.     Conjunctiva/sclera: Conjunctivae normal.  Cardiovascular:     Rate and Rhythm: Normal rate and regular rhythm.  Pulmonary:     Effort: Pulmonary effort is normal. No  respiratory distress.  Musculoskeletal:     Cervical back: Normal range of motion.     Comments: LLE No traumatic wounds, ecchymosis, or rash  Nontender  No knee or ankle effusion  Knee stable to varus/ valgus and anterior/posterior stress  Sens DPN, SPN, TN intact  Motor EHL, ext, flex, evers 5/5  DP 2+, PT 2+, No significant edema  Skin:    General: Skin is warm and dry.  Neurological:     Mental Status: He is alert.  Psychiatric:        Behavior: Behavior normal.     Assessment/Plan: Left knee pain -- Exam and history both concerning for septic joint. Unfortunately did not get enough fluid from aspriration for cell count. Culture no growth thus far. CT shows e/o osteo of proximal tibia around the cemented component. Unfortunately this is not a correctable problem. His best option here is an AKA. Dr. Lucia Gaskins to discuss with pt later today or in AM.    Lisette Abu, PA-C Orthopedic Surgery [phone] 03/29/2021, 11:16 AM

## 2021-03-29 NOTE — ED Notes (Signed)
Provider at bedside at this time

## 2021-03-29 NOTE — ED Notes (Signed)
Pt returned from ct at this time

## 2021-03-29 NOTE — Progress Notes (Signed)
Pharmacy Antibiotic Note  Jeffrey [name] is a 60 y.o. male admitted on 03/28/2021 with sepsis from possible catheter/graft infection +/- knee hardware infection.  Pharmacy has been consulted for vancomycin and cefepime dosing.  MWF HD w/ last session Fri 4/29.  Plan: Vancomycin '2500mg'$  in ED then '1000mg'$  IV every HD.  Goal pre-HD level 15-25 mcg/mL. Cefepime 2g in ED and after each HD.  Height: '5\' 5"'$  (165.1 cm) Weight: 128 kg (282 lb 3 oz) IBW/kg (Calculated) : 61.5  Temp (24hrs), Avg:103.1 F (39.5 C), Min:103 F (39.4 C), Max:103.2 F (39.6 C)  Recent Labs  Lab 03/28/21 2149 03/28/21 2358  WBC 12.3*  --   CREATININE 4.78*  --   LATICACIDVEN  --  1.3    Estimated Creatinine Clearance: 20.5 mL/min (A) (by C-G formula based on SCr of 4.78 mg/dL (H)).    Allergies  Allergen Reactions  . Bupropion Swelling  . Dexmedetomidine Nausea And Vomiting    Other reaction(s): Other (See Comments) Dose-limiting bradycardia Dose-limiting bradycardia   . Ibuprofen Shortness Of Breath and Swelling    Pt tolerates aspirin Pt tolerates aspirin   . Pioglitazone Anaphylaxis and Rash  . Tomato Anaphylaxis    Thank you for allowing pharmacy to be a part of this patient's care.  Wynona Neat, PharmD, BCPS  03/29/2021 3:18 AM

## 2021-03-29 NOTE — ED Notes (Signed)
At bedside with provider to attempt to obtain second bc at this time

## 2021-03-29 NOTE — ED Notes (Signed)
Returned from ct with blankets on. Pt advised if he was running a temp again we would have to remove them. Temp taken. Blankets removed

## 2021-03-29 NOTE — H&P (Addendum)
PCP:   Pcp, No   Chief Complaint:  Fever  HPI: This is a 60 year old gentleman with a complex medical history.  He has ESRD treated at [facility].  He is here visiting his daughter, when he developed fevers and lots of chills.  He reports nausea, vomiting, diaphoresis and mild confusion.  He also states he has a lots of left leg knee pain.  He states the pain in his knee is so bad he is unable to weight-bear.  He came to the ER because he because he knows what is going on.  Since being on dialysis he has had 8 infections of his hemodialysis catheter.  He states the last one about killed him.  He has had a recent graft placed on his right arm.  His hemodialysis cath is scheduled to be removed on Tuesday.  He believes he needs to be removed now because it is making him sick.  He also complains that his dialysis catheter is making his chest hurt.  He is dialyzed Monday, Wednesdays and Fridays.  He has chronic pain and swelling in his left knee.  He states his knee has been imaged repeatedly with CAT scans etc. at [facility].  He states since Friday his left knee has become more very swollen and painful.  He is not able to walk on it since.  He cannot bear weight.  He has a history of knee replacements/hardware bilaterally.  He has no history of gout.  In the ER, left knee imaged revealing massive effusion.  His knee is warm and painful to touch.  The ER physician aspirated the knee but was unable to get any fluid out.  He did obtain a reddish colored gel like effusion which he sent to the lab.  He is also been febrile in the ER.  T-max greater than 103.  The patient took Tylenol prior to come to the ER.  He is additionally given Tylenol in the ER.  His temperature briefly declined to 102 degrees, it is not rebounded to 103.  The patient is allergic to NSAIDs.  History provided by the patient who is alert and oriented. There is no evidence of confusion when I finally the interviewed patient.  Review of Systems:   Patient endorses: Fever, chills, nausea, vomiting, diaphoresis, mild confusion, chest pains, left knee swelling, left knee pain The patient denies anorexia, fever, weight loss,, vision loss, decreased hearing, hoarseness, chest pain, syncope, dyspnea on exertion, peripheral edema, balance deficits, hemoptysis, abdominal pain, melena, hematochezia, severe indigestion/heartburn, hematuria, incontinence, genital sores, muscle weakness, suspicious skin lesions, transient blindness, difficulty walking, depression, unusual weight change, abnormal bleeding, enlarged lymph nodes, angioedema, and breast masses.  Past Medical History: Past Medical History:  Diagnosis Date  . Arthritis   . Blood transfusion without reported diagnosis   . CHF (congestive heart failure) ([place])   . COPD (chronic obstructive pulmonary disease) ([place])   . Diabetes mellitus without complication ([place])   . Hypertension   . PTSD (post-traumatic stress disorder)   . Renal disorder    Stage 5 Kidney Failure   Past Surgical History:  Procedure Laterality Date  . JOINT REPLACEMENT     Bilateral knees    Medications: Prior to Admission medications   Medication Sig Start Date End Date Taking? Authorizing Provider  amLODipine (NORVASC) 5 MG tablet Take 5 mg by mouth at bedtime. 04/17/20  Yes [provider]  calcium acetate (PHOSLO) 667 MG capsule Take 1,334 mg by mouth with breakfast, with lunch,  and with evening meal. 10/02/19  Yes [provider]  carvedilol (COREG) 12.5 MG tablet Take 12.5 mg by mouth 2 (two) times daily with a meal. 04/17/20  Yes [provider]  Continuous Blood Gluc Receiver (DEXCOM G6 RECEIVER) DEVI Use 1 Device continuously 03/08/21  Yes [provider]  Continuous Blood Gluc Sensor (DEXCOM G6 SENSOR) MISC Use 1 each every 10 (ten) days 03/08/21  Yes [provider]  Continuous Blood Gluc Transmit (DEXCOM G6 TRANSMITTER) MISC Use 1 each every 3 (three) months  03/08/21  Yes [provider]  Cysteamine Bitartrate (PROCYSBI) 300 MG PACK accucheck guide meter 03/09/20  Yes [provider]  gabapentin (NEURONTIN) 300 MG capsule Take 300 mg by mouth at bedtime. 02/22/21  Yes [provider]  glucose blood (PRECISION QID TEST) test strip 4 times daily accucheck guide meter 09/28/20  Yes [provider]  insulin regular human CONCENTRATED (HUMULIN R U-500 KWIKPEN) 500 UNIT/ML kwikpen Inject 65-120 Units into the skin See admin instructions. 120 units before breakfast and 65 units before dinner 09/28/20  Yes [provider]  nortriptyline (PAMELOR) 10 MG capsule Take 20 mg by mouth at bedtime. 10/23/19 02/22/22 Yes [provider]  torsemide (DEMADEX) 20 MG tablet Take 100 mg by mouth in the morning and at bedtime. 10/23/19  Yes [provider]  ACCU-CHEK GUIDE test strip 4 (four) times daily. 11/15/20   [provider]  albuterol (VENTOLIN HFA) 108 (90 Base) MCG/ACT inhaler Inhale 1 puff into the lungs every 6 (six) hours as needed. 03/14/21   [provider]  ASPIRIN LOW DOSE 81 MG EC tablet Take 81 mg by mouth daily. 02/22/21   [provider]  atorvastatin (LIPITOR) 40 MG tablet Take 40 mg by mouth at bedtime. 02/09/21   [provider]  diazepam (VALIUM) 5 MG tablet Take 5 mg by mouth 2 (two) times daily as needed. 02/22/21   [provider]  metolazone (ZAROXOLYN) 10 MG tablet Take 10 mg by mouth daily. 03/01/21   [provider]  pantoprazole (PROTONIX) 20 MG tablet Take 20 mg by mouth daily. 02/09/21   [provider]  sertraline (ZOLOFT) 100 MG tablet Take 100 mg by mouth daily. 02/22/21   [provider]  traZODone (DESYREL) 100 MG tablet Take 200 mg by mouth at bedtime as needed. 02/22/21   [provider]    Allergies:   Allergies  Allergen Reactions  . Bupropion Swelling  . Dexmedetomidine Nausea And Vomiting     Other reaction(s): Other (See Comments) Dose-limiting bradycardia Dose-limiting bradycardia   . Ibuprofen Shortness Of Breath and Swelling    Pt tolerates aspirin Pt tolerates aspirin   . Pioglitazone Anaphylaxis and Rash  . Tomato Anaphylaxis    Social History:  reports that he quit smoking about 10 years ago. His smoking use included cigarettes. He has never used smokeless tobacco. He reports current alcohol use. He reports previous drug use. Drug: Marijuana.  Family History: History reviewed. No pertinent family history.  Physical Exam: Vitals:   03/28/21 2300 03/29/21 0051 03/29/21 0054 03/29/21 0115  BP: (!) 142/75 (!) 145/74  (!) 146/83  Pulse: (!) 126 (!) 125  (!) 120  Resp: 20 20  (!) 23  Temp:   (!) 103.2 F (39.6 C)   TempSrc:   Oral   SpO2: 95% 93%  93%  Weight:      Height:        General:  Alert and oriented  times three, well developed and nourished, distressed secondary to pain/discomfort Eyes: PERRLA, pink conjunctiva, no scleral icterus ENT: Moist oral mucosa, neck supple, no thyromegaly Lungs: clear to ascultation, no wheeze, no crackles, no use of accessory muscles Cardiovascular: regular rate and rhythm, no regurgitation, no gallops, no murmurs. No carotid bruits, no JVD Chest wall: Hemodialysis catheter left chest wall Abdomen: soft, positive BS, non-tender, non-distended, no organomegaly, not an acute abdomen GU: not examined Neuro: CN II - XII grossly intact, sensation intact Musculoskeletal: Left lower extremity: Knee quite swollen, warm, decreased range of motion secondary to pain.  No clubbing, cyanosis or edema Skin: no rash, no subcutaneous crepitation, no decubitus Psych: appropriate patient   Labs on Admission:  Recent Labs    03/28/21 2149  NA 129*  K 3.7  CL 88*  CO2 26  GLUCOSE 257*  BUN 98*  CREATININE 4.78*  CALCIUM 8.9   Recent Labs    03/28/21 2149  AST 25  ALT 14  ALKPHOS 71  BILITOT 0.6  PROT 9.0*  ALBUMIN 3.7    No results for input(s): LIPASE, AMYLASE in the last 72 hours. Recent Labs    03/28/21 2149  WBC 12.3*  NEUTROABS 10.8*  HGB 10.5*  HCT 32.5*  MCV 91.0  PLT 139*   No results for input(s): CKTOTAL, CKMB, CKMBINDEX, TROPONINI in the last 72 hours. Invalid input(s): POCBNP No results for input(s): DDIMER in the last 72 hours. No results for input(s): HGBA1C in the last 72 hours. No results for input(s): CHOL, HDL, LDLCALC, TRIG, CHOLHDL, LDLDIRECT in the last 72 hours. No results for input(s): TSH, T4TOTAL, T3FREE, THYROIDAB in the last 72 hours.  Invalid input(s): FREET3 No results for input(s): VITAMINB12, FOLATE, FERRITIN, TIBC, IRON, RETICCTPCT in the last 72 hours.  Micro Results: Recent Results (from the past 240 hour(s))  Resp Panel by RT-PCR (Flu A&B, Covid) Nasopharyngeal Swab     Status: None   Collection Time: 03/28/21 10:35 PM   Specimen: Nasopharyngeal Swab; Nasopharyngeal(NP) swabs in vial transport medium  Result Value Ref Range Status   SARS Coronavirus 2 by RT PCR NEGATIVE NEGATIVE Final    Comment: (NOTE) SARS-CoV-2 target nucleic acids are NOT DETECTED.  The SARS-CoV-2 RNA is generally detectable in upper respiratory specimens during the acute phase of infection. The lowest concentration of SARS-CoV-2 viral copies this assay can detect is 138 copies/mL. A negative result does not preclude SARS-Cov-2 infection and should not be used as the sole basis for treatment or other patient management decisions. A negative result may occur with  improper specimen collection/handling, submission of specimen other than nasopharyngeal swab, presence of viral mutation(s) within the areas targeted by this assay, and inadequate number of viral copies(<138 copies/mL). A negative result must be combined with clinical observations, patient history, and epidemiological information. The expected result is Negative.  Fact Sheet for Patients:   EntrepreneurPulse.com.au  Fact Sheet for Healthcare Providers:  IncredibleEmployment.be  This test is no t yet approved or cleared by the [place] FDA and  has been authorized for detection and/or diagnosis of SARS-CoV-2 by FDA under an Emergency Use Authorization (EUA). This EUA will remain  in effect (meaning this test can be used) for the duration of the COVID-19 declaration under Section 564(b)(1) of the Act, 21 U.S.C.section 360bbb-3(b)(1), unless the authorization is terminated  or revoked sooner.       Influenza A by PCR NEGATIVE NEGATIVE Final   Influenza B by PCR NEGATIVE NEGATIVE Final  Comment: (NOTE) The Xpert Xpress SARS-CoV-2/FLU/RSV plus assay is intended as an aid in the diagnosis of influenza from Nasopharyngeal swab specimens and should not be used as a sole basis for treatment. Nasal washings and aspirates are unacceptable for Xpert Xpress SARS-CoV-2/FLU/RSV testing.  Fact Sheet for Patients: EntrepreneurPulse.com.au  Fact Sheet for Healthcare Providers: IncredibleEmployment.be  This test is not yet approved or cleared by the [place] FDA and has been authorized for detection and/or diagnosis of SARS-CoV-2 by FDA under an Emergency Use Authorization (EUA). This EUA will remain in effect (meaning this test can be used) for the duration of the COVID-19 declaration under Section 564(b)(1) of the Act, 21 U.S.C. section 360bbb-3(b)(1), unless the authorization is terminated or revoked.  Performed at [facility], [place] [address], [place], [place] [address]   Body fluid culture w Gram Stain     Status: None (Preliminary result)   Collection Time: 03/29/21 12:10 AM   Specimen: Body Fluid  Result Value Ref Range Status   Specimen Description FLUID LEFT KNEE  Final   Special Requests SYRINGE  Final   Gram Stain   Final    FEW WBC SEEN NO ORGANISMS SEEN Performed at  [facility], [address] [place],  [address]    Culture PENDING  Incomplete   Report Status PENDING  Incomplete     Radiological Exams on Admission: DG Chest Port 1 View  Result Date: 03/28/2021 CLINICAL DATA:  Possible sepsis, fever, generalized pain, possible dialysis catheter infection EXAM: PORTABLE CHEST 1 VIEW COMPARISON:  03/08/2021 FINDINGS: Low lung volumes. Increasing opacities in the mid to lower lungs, could reflect atelectasis though developing edema or atypical infection could have a similar appearance in the appropriate clinical setting. Cardiomegaly with pulmonary vascular congestion. Dual lumen dialysis catheter tip terminates at the right atrium. Telemetry leads overlie the chest. No pneumothorax or visible effusion. No acute osseous or soft tissue abnormality. IMPRESSION: Low volumes with basilar opacities which may reflect atelectasis though edema or infection could have a similar appearance in the appropriate clinical setting. Right IJ approach dialysis catheter tip terminates at the right atrium. Electronically Signed   By: Lovena Le M.D.   On: 03/28/2021 22:51   DG Knee Complete 4 Views Left  Result Date: 03/28/2021 CLINICAL DATA:  Possible sepsis, fever and generalized pain EXAM: LEFT KNEE - COMPLETE 4+ VIEW COMPARISON:  None. FINDINGS: Extensive circumferential swelling with large joint effusion. Postsurgical changes from prior tricompartmental left knee arthroplasty with some questionable destructive changes about the articular margins as well as periprosthetic lucency surrounding the tibial component and cemented tibial stem. Additional heterotopic ossification is seen as well. No soft tissue gas. 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 Electronically Signed   By: Lovena Le M.D.   On: 03/28/2021 22:56    Assessment/Plan Present on Admission: . Fever  -Admit to stepdown -Differential diagnosis of fevers include septic hemodialysis catheter, septic left knee.  Blood cultures x2 collected.  Note, second blood culture was difficult to obtain and was collected approximately 90 minutes after antibiotics was given. -IV vancomycin and cefepime ordered pharmacy to dose -Pain meds as needed -Nephrology and orthopedic consult placed.  Defer to a.m. team to call.  No need for emergent hemodialysis at this time -Hemodialysis cath likely will be removed  Knee Effusion likely septic knee POA -See  above  ESRD -Nephrology consult needed  DM with hyperglycemia -Sliding scale insulin initiated.  Lantus 10 units subcu q. of sleep  HTN -Stable, home meds resumed  PTSD -Zoloft resumed  CHF -Stable, home meds resumed  Hyponatremia -Likely secondary to mild fluid overload from end-stage renal disease.   -Monitor  COPD -Stable, home meds resumed  Neuro: During the night patient was extremely sleepy and difficult to awaken for almost 2-3 hours.  It was unclear if this is due to untreated obstructive sleep apnea therefore natural state for the patient versus due to metabolic etiology.  Patient easily awakened with stimulation but quickly went back to sleep.  ABG was ordered after multiple attempts made to collect blood cultures.  Not long after the patient began waking naturally,  ABG was canceled.  All sedating medications have been held including his nortriptyline, trazodone, Neurontin and Valium. Additionally patient's spironolactone and Demadex have been held overnight.  Patient was thought to be somewhat dehydrated therefore 1 L lactated Ringer's was ordered in the ER.  This has been continued at 100 cc/h for a total of 10 hours.   Jeffrey [name] 03/29/2021, 1:52 AM

## 2021-03-29 NOTE — ED Notes (Signed)
Patient placed on primofit

## 2021-03-29 NOTE — ED Notes (Signed)
Patient transported to CT 

## 2021-03-29 NOTE — Progress Notes (Signed)
Subjective: Patient admitted this morning, see detailed H&P by Dr Ernesto Rutherford 60 year old male with history of ESRD, came to ED with complaints of fever.  He also had vomiting, diaphoresis and mild confusion.  Patient has been having left knee pain.  Patient has history of chronic pain and swelling in left knee and states that he has been imaged repeatedly with CT scans at [facility].  Patient has history of knee replacement/hardware bilaterally.  In the ED left knee imaging showed massive knee joint effusion.  Arthrocentesis was performed however only 1 cc of gelatinous red fluid was obtained by ED provider. Patient was empirically started on vancomycin and cefepime. He also has ESRD  Vitals:   03/29/21 1100 03/29/21 1208  BP: (!) 144/77 (!) 159/54  Pulse: 96 91  Resp: (!) 23 (!) 22  Temp: (!) 102.1 F (38.9 C) (!) 101.1 F (38.4 C)  SpO2: 97% 100%      A/P  Left knee swelling-concerning for septic arthritis, patient started empirically on vancomycin and cefepime.  Arthrocentesis was performed  and only 1 cc of gelatinous fluid obtained.  Will consult orthopedic surgery for further recommendations.  CT of the left knee obtained shows loosening of hardware with massive joint effusion.  Will discontinue IV fluids.  Blood pressure has been stable.  ESRD-we will consult nephrology for hemodialysis.  Patient gets hemodialysis Monday Wednesday Friday.  Diabetes mellitus type 2-continue sliding scale insulin with NovoLog.  Hypertension-blood pressure stable, continue home medications.  Hypersomnolence-apparently patient was extremely sleepy and difficult to awaken for almost 2 to 3 hours at nighttime.  ABG was ordered however patient woke up naturally.  ABG was canceled.  Hold sedating medications including nortriptyline, trazodone, Neurontin and Valium are on hold.  We will continue to monitor.  [facility] [phone]

## 2021-03-29 NOTE — ED Notes (Signed)
Breakfast order placed ?

## 2021-03-29 NOTE — ED Provider Notes (Signed)
Care assumed from Dr. Alvino Chapel at shift change.  Patient has extensive past medical history, most relevant tonight being end-stage renal disease on hemodialysis with indwelling dialysis catheter to the right subclavian vein.  He also has a history of bilateral knee replacements with increasing pain to the left knee.  Patient arrived here febrile with temperature of 103.  Code sepsis was initiated and due to the patient being a difficult stick, there was delay in obtaining IV access and blood cultures.  These were eventually obtained and patient given broad-spectrum antibiotics.  He has returned with a white count of 13,000, but no other significant abnormality.  There is no hyperkalemia, no acidosis, and no evidence on his chest x-ray for volume overload or infiltrate.  His vitals are stable.  I assumed care awaiting results of the laboratory studies.  Patient has been reevaluated and has increased pain in his left knee.  The x-ray obtained here tonight shows prior tricompartmental arthroplasty with large effusion and extensive soft tissue swelling.  There is hardware loosening concerning for septic arthritis and hardware infection.  For this reason, I performed an arthrocentesis as per procedure note.  I was only able to obtain approximately 1 cc of what is best described as gelatinous material that was maroon in color.  At this point, I feel as though patient will require admission for further treatment and observation.  Sepsis fluids withheld secondary to stable vital signs, normal lactate, and dialysis status.  ARTHOCENTESIS Performed by: Veryl Speak Consent: Verbal consent obtained. Risks and benefits: risks, benefits and alternatives were discussed Consent given by: patient Required items: required blood products, implants, devices, and special equipment available Patient identity confirmed: verbally with patient Time out: Immediately prior to procedure a "time out" was called to verify the  correct patient, procedure, equipment, support staff and site/side marked as required. Indications: fever, left knee pain Joint: Left Knee Local anesthesia used: lidocaine 5cc Preparation: Patient was prepped and draped in the usual sterile fashion. Aspirate appearance: maroon, gelatinous Aspirate amount: 1 ml Patient tolerance: Patient tolerated the procedure well with no immediate complications.  CRITICAL CARE Performed by: Veryl Speak Total critical care time: 35 minutes Critical care time was exclusive of separately billable procedures and treating other patients. Critical care was necessary to treat or prevent imminent or life-threatening deterioration. Critical care was time spent personally by me on the following activities: development of treatment plan with patient and/or surrogate as well as nursing, discussions with consultants, evaluation of patient's response to treatment, examination of patient, obtaining history from patient or surrogate, ordering and performing treatments and interventions, ordering and review of laboratory studies, ordering and review of radiographic studies, pulse oximetry and re-evaluation of patient's condition.      Veryl Speak, MD 03/29/21 0230

## 2021-03-30 ENCOUNTER — Inpatient Hospital Stay (HOSPITAL_COMMUNITY): Payer: Medicare Other

## 2021-03-30 DIAGNOSIS — Z95828 Presence of other vascular implants and grafts: Secondary | ICD-10-CM

## 2021-03-30 DIAGNOSIS — R7881 Bacteremia: Secondary | ICD-10-CM

## 2021-03-30 DIAGNOSIS — N186 End stage renal disease: Secondary | ICD-10-CM | POA: Diagnosis not present

## 2021-03-30 DIAGNOSIS — M25562 Pain in left knee: Secondary | ICD-10-CM | POA: Diagnosis not present

## 2021-03-30 DIAGNOSIS — B9562 Methicillin resistant Staphylococcus aureus infection as the cause of diseases classified elsewhere: Secondary | ICD-10-CM | POA: Diagnosis not present

## 2021-03-30 HISTORY — PX: IR REMOVAL TUN CV CATH W/O FL: IMG2289

## 2021-03-30 LAB — COMPREHENSIVE METABOLIC PANEL
ALT: 21 U/L (ref 0–44)
AST: 31 U/L (ref 15–41)
Albumin: 3.3 g/dL — ABNORMAL LOW (ref 3.5–5.0)
Alkaline Phosphatase: 63 U/L (ref 38–126)
Anion gap: 16 — ABNORMAL HIGH (ref 5–15)
BUN: 66 mg/dL — ABNORMAL HIGH (ref 6–20)
CO2: 25 mmol/L (ref 22–32)
Calcium: 8.5 mg/dL — ABNORMAL LOW (ref 8.9–10.3)
Chloride: 87 mmol/L — ABNORMAL LOW (ref 98–111)
Creatinine, Ser: 4.51 mg/dL — ABNORMAL HIGH (ref 0.61–1.24)
GFR, Estimated: 14 mL/min — ABNORMAL LOW (ref >60)
Glucose, Bld: 311 mg/dL — ABNORMAL HIGH (ref 70–99)
Potassium: 4.2 mmol/L (ref 3.5–5.1)
Sodium: 128 mmol/L — ABNORMAL LOW (ref 135–145)
Total Bilirubin: 0.7 mg/dL (ref 0.3–1.2)
Total Protein: 7.7 g/dL (ref 6.5–8.1)

## 2021-03-30 LAB — URINE CULTURE: Culture: NO GROWTH

## 2021-03-30 LAB — URINALYSIS, ROUTINE W REFLEX MICROSCOPIC
Bilirubin Urine: NEGATIVE
Glucose, UA: NEGATIVE mg/dL
Hgb urine dipstick: NEGATIVE
Ketones, ur: NEGATIVE mg/dL
Leukocytes,Ua: NEGATIVE
Nitrite: NEGATIVE
Protein, ur: NEGATIVE mg/dL
Specific Gravity, Urine: 1.018 (ref 1.005–1.030)
pH: 5 (ref 5.0–8.0)

## 2021-03-30 LAB — CBC
HCT: 32.5 % — ABNORMAL LOW (ref 39.0–52.0)
Hemoglobin: 10.4 g/dL — ABNORMAL LOW (ref 13.0–17.0)
MCH: 29.6 pg (ref 26.0–34.0)
MCHC: 32 g/dL (ref 30.0–36.0)
MCV: 92.6 fL (ref 80.0–100.0)
Platelets: 90 10*3/uL — ABNORMAL LOW (ref 150–400)
RBC: 3.51 MIL/uL — ABNORMAL LOW (ref 4.22–5.81)
RDW: 17.5 % — ABNORMAL HIGH (ref 11.5–15.5)
WBC: 10.2 10*3/uL (ref 4.0–10.5)
nRBC: 0 % (ref 0.0–0.2)

## 2021-03-30 LAB — GLUCOSE, CAPILLARY
Glucose-Capillary: 298 mg/dL — ABNORMAL HIGH (ref 70–99)
Glucose-Capillary: 340 mg/dL — ABNORMAL HIGH (ref 70–99)
Glucose-Capillary: 381 mg/dL — ABNORMAL HIGH (ref 70–99)
Glucose-Capillary: 320 mg/dL — ABNORMAL HIGH (ref 70–99)

## 2021-03-30 MED ORDER — CHLORHEXIDINE GLUCONATE 4 % EX LIQD
CUTANEOUS | Status: AC
  Filled 2021-03-30: qty 15

## 2021-03-30 MED ORDER — LIDOCAINE HCL 1 % IJ SOLN
INTRAMUSCULAR | Status: AC
  Filled 2021-03-30: qty 20

## 2021-03-30 MED ORDER — INSULIN ASPART 100 UNIT/ML IJ SOLN
5.0000 [IU] | Freq: Three times a day (TID) | INTRAMUSCULAR | Status: DC
  Administered 2021-03-30 – 2021-04-01 (×6): 5 [IU] via SUBCUTANEOUS

## 2021-03-30 MED ORDER — INSULIN GLARGINE 100 UNIT/ML ~~LOC~~ SOLN
30.0000 [IU] | Freq: Every day | SUBCUTANEOUS | Status: DC
  Administered 2021-03-30: 30 [IU] via SUBCUTANEOUS
  Filled 2021-03-30 (×2): qty 0.3

## 2021-03-30 MED ORDER — LIDOCAINE HCL (PF) 1 % IJ SOLN
INTRAMUSCULAR | Status: DC | PRN
  Administered 2021-03-30: 10 mL

## 2021-03-30 MED ORDER — ONDANSETRON HCL 4 MG/2ML IJ SOLN: 4.0000 mg | Freq: Four times a day (QID) | INTRAMUSCULAR | Status: DC | PRN

## 2021-03-30 NOTE — Consult Note (Signed)
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 Dialysis catheter removed today. Orthopedics recommending left above knee amputation as he has had previous revisions. Current fistula in right arm does not appear to be infected. Will need to recheck blood cultures and will attempt to coordinate with nephrology to complete with next dialysis as he emphasized he is a hard stick. TTE ordered to check for endocarditis and will likely need TEE. Without revision of the left knee or above knee amputation it is unlikely that source control will be achieved. He will require prolonged antibiotics and possible lifelong suppression depending upon interventions. Plan of care was discussed in detail.  PLAN:  1. Continue vancomycin. 2. TTE to check for endocarditis and will likely need TEE 3. Blood cultures with dialysis if able to check for clearance of bacteremia.  4. Left knee interventions per orthopedics.  5. ESRD per nephrology.  6. Contact precautions for MRSA  Principal Problem:   MRSA bacteremia Active Problems:   Fever   ESRD (end stage renal disease) (HCC)   COPD (chronic obstructive pulmonary disease) (HCC)   Hypertension complicating diabetes ([place])   Type 2 diabetes mellitus with hyperlipidemia (HCC)   PTSD (post-traumatic stress disorder)   CHF (congestive heart failure) ([place])   . amLODipine  5 mg Oral QHS  . aspirin  81 mg Oral Daily  . atorvastatin  40 mg Oral QHS  . calcium acetate  1,334 mg Oral TID with meals  . carvedilol  12.5 mg Oral BID WC  . chlorhexidine      . Chlorhexidine Gluconate Cloth  6  each Topical Q0600  . heparin  5,000 Units Subcutaneous Q8H  . insulin aspart  0-15 Units Subcutaneous TID WC  . insulin aspart  0-5 Units Subcutaneous QHS  . insulin glargine  10 Units Subcutaneous QHS  . lidocaine      . pantoprazole  20 mg Oral Daily  . sertraline  100 mg Oral Daily     HPI: Jeffrey [name] is a 60 y.o. male with previous medical history of ESRD on HD, hypertension, diabetes, and COPD was seen in the ED for fevers, chills, nausea, vomiting, and knee pain.  Jeffrey [name] is primarily seen at [facility] for his ESRD. Reports several infections of his hemodialysis catheter. On 03/04/21 was seen by [facility] following right axillary to axillary loop graft with good recovery and ready to use. At the time was using a right internal jugular PermCath with recommendation for removal of the PermCath after 2 weeks of successful cannulation which was to be 5/3 and believed it to be infected and making him sick. Last IR central cathete replacement was 01/20/21. During that time he also had Enterobacter aerogenes bacteremia. Does have history of MRSA bacteremia in December 2020.   Jeffrey [name] has chronic pain in his left total knee replacement which was done initially in February 2014. Per [facility] records he had removal of left knee prosthesis on 12/02/19. Since Friday prior to admission his left knee has become more  edematous and painful with inability to bare weight. Left knee x-ray on 5/1 with large effusion and extensive soft tissue swelling and questionable destructive changes and hardware loosening concerning for septic arthritis and hardware infection. CT knee with irregular lucency around the tibial prosthesis which may reflect hardware loosening or infection. Orthopedics was unable to obtain enough fluid from aspiration for cell count with cultures pending. Revision was discussed at [facility] only if he lost weight. Recommending above knee amputation.   Jeffrey [name] has been febrile since  admission with most recent being last evening at 102.9. Blood cultures are positive for MRSA. Currently on Day 3 of antibiotic therapy with vancomycin. Has received 1 dose of cefepime. Jeffrey [name] noted that all of his symptoms started when he was visiting his daughter over the weekend. Knee pain was increased as well as having pain from his dialysis cathter. Right arm with new fistula is sore. Does have chronic back pain that has not changed.  Review of Systems: Review of Systems  Constitutional: Positive for fever. Negative for chills and weight loss.  Respiratory: Negative for cough, shortness of breath and wheezing.   Cardiovascular: Negative for chest pain and leg swelling.  Gastrointestinal: Negative for abdominal pain, constipation, diarrhea, nausea and vomiting.  Musculoskeletal: Positive for back pain.       Positive for left knee pain  Skin: Negative for rash.     Past Medical History:  Diagnosis Date  . Arthritis   . Blood transfusion without reported diagnosis   . CHF (congestive heart failure) ([place])   . COPD (chronic obstructive pulmonary disease) ([place])   . Diabetes mellitus without complication ([place])   . Hypertension   . PTSD (post-traumatic stress disorder)   . Renal disorder    Stage 5 Kidney Failure    Social History   Tobacco Use  . Smoking status: Former Smoker    Types: Cigarettes    Quit date: 2012    Years since quitting: 10.3  . Smokeless tobacco: Never Used  Vaping Use  . Vaping Use: Never used  Substance Use Topics  . Alcohol use: Yes  . Drug use: Not Currently    Types: Marijuana    Comment: Years ago    History reviewed. No pertinent family history.  Allergies  Allergen Reactions  . Bupropion Swelling  . Dexmedetomidine Nausea And Vomiting    Other reaction(s): Other (See Comments) Dose-limiting bradycardia Dose-limiting bradycardia   . Ibuprofen Shortness Of Breath and Swelling    Pt tolerates aspirin Pt tolerates aspirin   .  Pioglitazone Anaphylaxis and Rash  . Tomato Anaphylaxis    OBJECTIVE: Blood pressure 114/64, pulse 90, temperature 98.4 F (36.9 C), temperature source Oral, resp. rate 19, height '5\' 5"'$  (1.651 m), weight 125.7 kg, SpO2 96 %.  Physical Exam Constitutional:      General: He is not in acute distress.    Appearance: He is well-developed. He is obese.  Cardiovascular:     Rate and Rhythm: Normal rate and regular rhythm.     Heart sounds: Normal heart sounds.  Pulmonary:     Effort: Pulmonary effort is normal.     Breath sounds: Normal breath sounds.  Musculoskeletal:     Comments: Left knee with moderate amount of edema.   Skin:    General: Skin is warm and dry.  Neurological:     Mental Status: He is alert and oriented to person, place, and time.  Psychiatric:        Behavior:  Behavior normal.        Thought Content: Thought content normal.        Judgment: Judgment normal.     Lab Results Lab Results  Component Value Date   WBC 10.2 03/30/2021   HGB 10.4 (L) 03/30/2021   HCT 32.5 (L) 03/30/2021   MCV 92.6 03/30/2021   PLT 90 (L) 03/30/2021    Lab Results  Component Value Date   CREATININE 4.51 (H) 03/30/2021   BUN 66 (H) 03/30/2021   NA 128 (L) 03/30/2021   K 4.2 03/30/2021   CL 87 (L) 03/30/2021   CO2 25 03/30/2021    Lab Results  Component Value Date   ALT 21 03/30/2021   AST 31 03/30/2021   ALKPHOS 63 03/30/2021   BILITOT 0.7 03/30/2021     Microbiology: Recent Results (from the past 240 hour(s))  Urine culture     Status: None   Collection Time: 03/28/21  9:49 PM   Specimen: In/Out Cath Urine  Result Value Ref Range Status   Specimen Description IN/OUT CATH URINE  Final   Special Requests NONE  Final   Culture   Final    NO GROWTH Performed at [facility], [address] [place], [place] [address]    Report Status 03/30/2021 FINAL  Final  Resp Panel by RT-PCR (Flu A&B, Covid) Nasopharyngeal Swab     Status: None   Collection Time:  03/28/21 10:35 PM   Specimen: Nasopharyngeal Swab; Nasopharyngeal(NP) swabs in vial transport medium  Result Value Ref Range Status   SARS Coronavirus 2 by RT PCR NEGATIVE NEGATIVE Final    Comment: (NOTE) SARS-CoV-2 target nucleic acids are NOT DETECTED.  The SARS-CoV-2 RNA is generally detectable in upper respiratory specimens during the acute phase of infection. The lowest concentration of SARS-CoV-2 viral copies this assay can detect is 138 copies/mL. A negative result does not preclude SARS-Cov-2 infection and should not be used as the sole basis for treatment or other patient management decisions. A negative result may occur with  improper specimen collection/handling, submission of specimen other than nasopharyngeal swab, presence of viral mutation(s) within the areas targeted by this assay, and inadequate number of viral copies(<138 copies/mL). A negative result must be combined with clinical observations, patient history, and epidemiological information. The expected result is Negative.  Fact Sheet for Patients:  EntrepreneurPulse.com.au  Fact Sheet for Healthcare Providers:  IncredibleEmployment.be  This test is no t yet approved or cleared by the [place] FDA and  has been authorized for detection and/or diagnosis of SARS-CoV-2 by FDA under an Emergency Use Authorization (EUA). This EUA will remain  in effect (meaning this test can be used) for the duration of the COVID-19 declaration under Section 564(b)(1) of the Act, 21 U.S.C.section 360bbb-3(b)(1), unless the authorization is terminated  or revoked sooner.       Influenza A by PCR NEGATIVE NEGATIVE Final   Influenza B by PCR NEGATIVE NEGATIVE Final    Comment: (NOTE) The Xpert Xpress SARS-CoV-2/FLU/RSV plus assay is intended as an aid in the diagnosis of influenza from Nasopharyngeal swab specimens and should not be used as a sole basis for treatment. Nasal washings  and aspirates are unacceptable for Xpert Xpress SARS-CoV-2/FLU/RSV testing.  Fact Sheet for Patients: EntrepreneurPulse.com.au  Fact Sheet for Healthcare Providers: IncredibleEmployment.be  This test is not yet approved or cleared by the [place] FDA and has been authorized for detection and/or diagnosis of SARS-CoV-2 by FDA under an Emergency Use Authorization (  EUA). This EUA will remain in effect (meaning this test can be used) for the duration of the COVID-19 declaration under Section 564(b)(1) of the Act, 21 U.S.C. section 360bbb-3(b)(1), unless the authorization is terminated or revoked.  Performed at [facility], [place] [address], [place], [place] [address]   Blood Culture (routine x 2)     Status: Abnormal (Preliminary result)   Collection Time: 03/28/21 11:30 PM   Specimen: BLOOD LEFT FOREARM  Result Value Ref Range Status   Specimen Description BLOOD LEFT FOREARM  Final   Special Requests   Final    BOTTLES DRAWN AEROBIC AND ANAEROBIC Blood Culture results may not be optimal due to an inadequate volume of blood received in culture bottles   Culture  Setup Time   Final    GRAM POSITIVE COCCI IN CLUSTERS IN BOTH AEROBIC AND ANAEROBIC BOTTLES CRITICAL RESULT CALLED TO, READ BACK BY AND VERIFIED WITH: PHARMD HALEY V. [id number] FCP    Culture (A)  Final    STAPHYLOCOCCUS AUREUS SUSCEPTIBILITIES TO FOLLOW Performed at [facility], [place] [address], [place], [place] [address]    Report Status PENDING  Incomplete  Blood Culture ID Panel (Reflexed)     Status: Abnormal   Collection Time: 03/28/21 11:30 PM  Result Value Ref Range Status   Enterococcus faecalis NOT DETECTED NOT DETECTED Final   Enterococcus Faecium NOT DETECTED NOT DETECTED Final   Listeria monocytogenes NOT DETECTED NOT DETECTED Final   Staphylococcus species DETECTED (A) NOT DETECTED Final    Comment: CRITICAL RESULT CALLED TO, READ BACK BY AND  VERIFIED WITH: PHARMD HALEY V. [id number] FCP    Staphylococcus aureus (BCID) DETECTED (A) NOT DETECTED Final    Comment: Methicillin (oxacillin)-resistant Staphylococcus aureus (MRSA). MRSA is predictably resistant to beta-lactam antibiotics (except ceftaroline). Preferred therapy is vancomycin unless clinically contraindicated. Patient requires contact precautions if  hospitalized. CRITICAL RESULT CALLED TO, READ BACK BY AND VERIFIED WITH: PHARMD HALEY V. [id number] FCP    Staphylococcus epidermidis NOT DETECTED NOT DETECTED Final   Staphylococcus lugdunensis NOT DETECTED NOT DETECTED Final   Streptococcus species NOT DETECTED NOT DETECTED Final   Streptococcus agalactiae NOT DETECTED NOT DETECTED Final   Streptococcus pneumoniae NOT DETECTED NOT DETECTED Final   Streptococcus pyogenes NOT DETECTED NOT DETECTED Final   A.calcoaceticus-baumannii NOT DETECTED NOT DETECTED Final   Bacteroides fragilis NOT DETECTED NOT DETECTED Final   Enterobacterales NOT DETECTED NOT DETECTED Final   Enterobacter cloacae complex NOT DETECTED NOT DETECTED Final   Escherichia coli NOT DETECTED NOT DETECTED Final   Klebsiella aerogenes NOT DETECTED NOT DETECTED Final   Klebsiella oxytoca NOT DETECTED NOT DETECTED Final   Klebsiella pneumoniae NOT DETECTED NOT DETECTED Final   Proteus species NOT DETECTED NOT DETECTED Final   Salmonella species NOT DETECTED NOT DETECTED Final   Serratia marcescens NOT DETECTED NOT DETECTED Final   Haemophilus influenzae NOT DETECTED NOT DETECTED Final   Neisseria meningitidis NOT DETECTED NOT DETECTED Final   Pseudomonas aeruginosa NOT DETECTED NOT DETECTED Final   Stenotrophomonas maltophilia NOT DETECTED NOT DETECTED Final   Candida albicans NOT DETECTED NOT DETECTED Final   Candida auris NOT DETECTED NOT DETECTED Final   Candida glabrata NOT DETECTED NOT DETECTED Final   Candida krusei NOT DETECTED NOT DETECTED Final   Candida parapsilosis NOT DETECTED NOT  DETECTED Final   Candida tropicalis NOT DETECTED NOT DETECTED Final   Cryptococcus neoformans/gattii NOT DETECTED NOT DETECTED Final   Meth resistant  mecA/C and MREJ DETECTED (A) NOT DETECTED Final    Comment: CRITICAL RESULT CALLED TO, READ BACK BY AND VERIFIED WITH: Jonn Shingles [id number] FCP Performed at [facility], [place] [address], [place], [place] [address]   Body fluid culture w Gram Stain     Status: None (Preliminary result)   Collection Time: 03/29/21 12:10 AM   Specimen: Body Fluid  Result Value Ref Range Status   Specimen Description FLUID LEFT KNEE  Final   Special Requests SYRINGE  Final   Gram Stain FEW WBC SEEN NO ORGANISMS SEEN   Final   Culture   Final    NO GROWTH 1 DAY Performed at [facility], [address] [place], [place] [address]    Report Status PENDING  Incomplete  Blood Culture (routine x 2)     Status: None (Preliminary result)   Collection Time: 03/29/21  3:00 AM   Specimen: BLOOD  Result Value Ref Range Status   Specimen Description BLOOD SITE NOT SPECIFIED  Final   Special Requests   Final    BOTTLES DRAWN AEROBIC AND ANAEROBIC Blood Culture adequate volume   Culture   Final    NO GROWTH 1 DAY Performed at [facility], [place] [address] [place], [place] [address]    Report Status PENDING  Incomplete     Terri Piedra, [place] [facility]  03/30/2021  2:07 PM

## 2021-03-30 NOTE — Progress Notes (Addendum)
[facility]  PROGRESS NOTE  Jeffrey [name] [id number] DOB: 09/25/1961 DOA: 03/28/2021 PCP: Pcp, No   Brief HPI:   *60 year old male with history of ESRD, came to ED with complaints of fever.  He also had vomiting, diaphoresis and mild confusion.  Patient has been having left knee pain.  Patient has history of chronic pain and swelling in left knee and states that he has been imaged repeatedly with CT scans at [facility].  Patient has history of knee replacement/hardware bilaterally.  In the ED left knee imaging showed massive knee joint effusion.  Arthrocentesis was performed however only 1 cc of gelatinous red fluid was obtained by ED provider. Patient was empirically started on vancomycin and cefepime. He also has ESRD    Subjective   Patient seen and examined, continues to have knee pain.  He was seen by orthopedic surgery yesterday.   Assessment/Plan:     1. Severe sepsis due to septic arthritis-patient presented with left knee pain, empirically started on vancomycin and cefepime for septic arthritis.  Arthrocentesis was performed in the ED and only 1 cc of gelatinous red fluid was obtained.  Orthopedic surgery was consulted.  CT of the left knee showed loosening of hardware versus infection with massive joint effusion.  Blood pressure has been stable. 2. MRSA bacteremia-blood culture growing MRSA.  Patient started on vancomycin. 3. ESRD-nephrology consulted for hemodialysis.  Patient gets hemodialysis Monday Wednesday Friday. 4. Diabetes mellitus type 2-continue sliding scale insulin NovoLog.  CBGs elevated.  Will increase dose of Lantus to 30 units subcu daily.  We will add NovoLog 5 units 3 times daily meal coverage. 5. Hypertension-blood pressure stable, continue Coreg, amlodipine. 6. Hypersomnolence-apparently patient was extremely sleepy and difficult to awaken for almost 2 to 3 hours at the night of admission.  ABG was ordered however patient woke up naturally.  ABG was canceled.   Hypersomnolence has resolved.  Patient mentally back to baseline. Hold sedating medications including nortriptyline, trazodone, Neurontin and Valium are on hold.  We will continue to monitor.   Scheduled medications:   . amLODipine  5 mg Oral QHS  . aspirin  81 mg Oral Daily  . atorvastatin  40 mg Oral QHS  . calcium acetate  1,334 mg Oral TID with meals  . carvedilol  12.5 mg Oral BID WC  . chlorhexidine      . Chlorhexidine Gluconate Cloth  6 each Topical Q0600  . heparin  5,000 Units Subcutaneous Q8H  . insulin aspart  0-15 Units Subcutaneous TID WC  . insulin aspart  0-5 Units Subcutaneous QHS  . insulin glargine  10 Units Subcutaneous QHS  . lidocaine      . pantoprazole  20 mg Oral Daily  . sertraline  100 mg Oral Daily         Data Reviewed:   CBG:  Recent Labs  Lab 03/29/21 1215 03/29/21 1814 03/29/21 2154 03/30/21 0751 03/30/21 1150  GLUCAP 400* 218* 271* 298* 340*    SpO2: 96 % O2 Flow Rate (L/min): 0 L/min    Vitals:   03/30/21 0105 03/30/21 0510 03/30/21 0730 03/30/21 1151  BP: 139/71 137/70 107/82 114/64  Pulse: 86 88 92 90  Resp: '18 20 17 19  '$ Temp: 99.1 F (37.3 C) 99.2 F (37.3 C) 99.7 F (37.6 C) 98.4 F (36.9 C)  TempSrc: Rectal Rectal Oral Oral  SpO2: 100% 100% 92% 96%  Weight:      Height:         Intake/Output  Summary (Last 24 hours) at 03/30/2021 1558 Last data filed at 03/30/2021 1153 Gross per 24 hour  Intake 920 ml  Output 3250 ml  Net -2330 ml    05/01 1901 - 05/03 0700 In: 4028.1 [P.O.:480; I.V.:524.8] Out: 3400 [Urine:400]  Filed Weights   03/28/21 2132 03/29/21 1326 03/29/21 1730  Weight: 128 kg 128.9 kg 125.7 kg    CBC:  Recent Labs  Lab 03/28/21 2149 03/29/21 1050 03/30/21 0607  WBC 12.3* 12.6* 10.2  HGB 10.5* 9.8* 10.4*  HCT 32.5* 30.5* 32.5*  PLT 139* 112* 90*  MCV 91.0 92.4 92.6  MCH 29.4 29.7 29.6  MCHC 32.3 32.1 32.0  RDW 17.2* 17.5* 17.5*  LYMPHSABS 0.4*  --   --   MONOABS 0.9  --   --    EOSABS 0.0  --   --   BASOSABS 0.0  --   --     Complete metabolic panel:  Recent Labs  Lab 03/28/21 2149 03/28/21 2358 03/29/21 0157 03/29/21 1050 03/30/21 0607  NA 129*  --   --  127* 128*  K 3.7  --   --  3.3* 4.2  CL 88*  --   --  88* 87*  CO2 26  --   --  23 25  GLUCOSE 257*  --   --  447* 311*  BUN 98*  --   --  102* 66*  CREATININE 4.78*  --   --  4.89* 4.51*  CALCIUM 8.9  --   --  8.3* 8.5*  AST 25  --   --   --  31  ALT 14  --   --   --  21  ALKPHOS 71  --   --   --  63  BILITOT 0.6  --   --   --  0.7  ALBUMIN 3.7  --   --   --  3.3*  LATICACIDVEN  --  1.3  --   --   --   INR 1.2  --   --   --   --   HGBA1C  --   --  8.2*  --   --     No results for input(s): LIPASE, AMYLASE in the last 168 hours.  Recent Labs  Lab 03/28/21 2235  Quentin    ------------------------------------------------------------------------------------------------------------------ No results for input(s): CHOL, HDL, LDLCALC, TRIG, CHOLHDL, LDLDIRECT in the last 72 hours.  Lab Results  Component Value Date   HGBA1C 8.2 (H) 03/29/2021   ------------------------------------------------------------------------------------------------------------------ No results for input(s): TSH, T4TOTAL, T3FREE, THYROIDAB in the last 72 hours.  Invalid input(s): FREET3 ------------------------------------------------------------------------------------------------------------------ No results for input(s): VITAMINB12, FOLATE, FERRITIN, TIBC, IRON, RETICCTPCT in the last 72 hours.  Coagulation profile Recent Labs  Lab 03/28/21 2149  INR 1.2   No results for input(s): DDIMER in the last 72 hours.  Cardiac Enzymes No results for input(s): CKTOTAL, CKMB, CKMBINDEX, TROPONINI in the last 168 hours.  ------------------------------------------------------------------------------------------------------------------ No results found for: BNP   Antibiotics: Anti-infectives (From  admission, onward)   Start     Dose/Rate Route Frequency Ordered Stop   03/29/21 2000  ceFEPIme (MAXIPIME) 2 g in sodium chloride 0.9 % 100 mL IVPB  Status:  Discontinued        2 g 200 mL/hr over 30 Minutes Intravenous Every M-W-F (2000) 03/29/21 0321 03/29/21 1541   03/29/21 1200  vancomycin (VANCOCIN) IVPB 1000 mg/200 mL premix        1,000 mg 200 mL/hr over  60 Minutes Intravenous Every M-W-F (Hemodialysis) 03/29/21 0321     03/29/21 0000  vancomycin (VANCOCIN) 2,500 mg in sodium chloride 0.9 % 500 mL IVPB        2,500 mg 250 mL/hr over 120 Minutes Intravenous  Once 03/28/21 2346 03/29/21 0340   03/29/21 0000  ceFEPIme (MAXIPIME) 2 g in sodium chloride 0.9 % 100 mL IVPB        2 g 200 mL/hr over 30 Minutes Intravenous  Once 03/28/21 2346 03/29/21 0126       Radiology Reports  CT KNEE LEFT WO CONTRAST  Result Date: 03/29/2021 CLINICAL DATA:  Fever with generalized pain and possible sepsis. History of total knee arthroplasty, date unknown. Clinical concern for joint infection. EXAM: CT OF THE LEFT KNEE WITHOUT CONTRAST TECHNIQUE: Multidetector CT imaging of the left knee was performed according to the standard protocol. Multiplanar CT image reconstructions were also generated. COMPARISON:  Radiographs 03/28/2021 FINDINGS: Bones/Joint/Cartilage Status post left total knee arthroplasty. There is prominent irregular lucency surrounding the tibial prosthesis which could reflect hardware loosening or infection. No loosening of the femoral hardware identified. The patella is located without gross bone destruction allowing for artifact from the total knee arthroplasty. There is a moderate-sized knee joint effusion with associated capsular calcifications. No intra-articular air identified. Ligaments Suboptimally assessed by CT.  Status post total knee arthroplasty. Muscles and Tendons The extensor mechanism appears intact. The distal quadriceps tendon appears attenuated. No focal muscular abnormalities  are identified. Soft tissues No periarticular fluid collections, unexpected foreign bodies or soft tissue emphysema identified. Aside from the nonspecific joint effusion, no significant periarticular inflammatory changes are seen. IMPRESSION: 1. As demonstrated on preceding radiographs, there is irregular lucency around the tibial prosthesis which may reflect hardware loosening or infection. 2. No loosening of the femoral hardware identified. 3. Nonspecific moderate-sized knee joint effusion. Arthrocentesis recommended if persistent concern of infection. Electronically Signed   By: Richardean Sale M.D.   On: 03/29/2021 08:19   IR Removal Tun Cv Cath W/O FL  Result Date: 03/30/2021 INDICATION: Bacteremia.  Request for tunneled hemodialysis catheter removal. EXAM: REMOVAL OF TUNNELED HEMODIALYSIS CATHETER MEDICATIONS: 10 mL 1% lidocaine COMPLICATIONS: None immediate. PROCEDURE: Informed written consent was obtained from the patient following an explanation of the procedure, risks, benefits and alternatives to treatment. A time out was performed prior to the initiation of the procedure. Maximal barrier sterile technique was utilized including caps, mask, sterile gowns, sterile gloves, large sterile drape, and hand hygiene. ChloraPrep was used to prep the patient's right neck, chest and existing catheter. 1% lidocaine with epinephrine was injected around the catheter and the subcutaneous tunnel. The catheter was dissected out using scissors and curved hemostats until the cuff was freed from the surrounding fibrous sheath. Partial cuff retained, catheter was removed intact otherwise. Hemostasis was obtained with manual compression. A dressing was placed. The patient tolerated the procedure well without immediate post procedural complication. IMPRESSION: Successful removal of tunneled dialysis catheter. Read by: Durenda Guthrie, PA-C Electronically Signed   By: Jacqulynn Cadet M.D.   On: 03/30/2021 11:06   DG Chest Port  1 View  Result Date: 03/28/2021 CLINICAL DATA:  Possible sepsis, fever, generalized pain, possible dialysis catheter infection EXAM: PORTABLE CHEST 1 VIEW COMPARISON:  03/08/2021 FINDINGS: Low lung volumes. Increasing opacities in the mid to lower lungs, could reflect atelectasis though developing edema or atypical infection could have a similar appearance in the appropriate clinical setting. Cardiomegaly with pulmonary vascular congestion. Dual lumen dialysis catheter tip  terminates at the right atrium. Telemetry leads overlie the chest. No pneumothorax or visible effusion. No acute osseous or soft tissue abnormality. IMPRESSION: Low volumes with basilar opacities which may reflect atelectasis though edema or infection could have a similar appearance in the appropriate clinical setting. Right IJ approach dialysis catheter tip terminates at the right atrium. Electronically Signed   By: Lovena Le M.D.   On: 03/28/2021 22:51   DG Knee Complete 4 Views Left  Result Date: 03/28/2021 CLINICAL DATA:  Possible sepsis, fever and generalized pain EXAM: LEFT KNEE - COMPLETE 4+ VIEW COMPARISON:  None. FINDINGS: Extensive circumferential swelling with large joint effusion. Postsurgical changes from prior tricompartmental left knee arthroplasty with some questionable destructive changes about the articular margins as well as periprosthetic lucency surrounding the tibial component and cemented tibial stem. Additional heterotopic ossification is seen as well. No soft tissue gas. 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 Electronically Signed   By: Lovena Le M.D.   On: 03/28/2021 22:56      DVT prophylaxis: Heparin  Code Status: Full code  Family Communication: No family at  bedside   Consultants:  Nephrology  Orthopedics  Procedures:      Objective    Physical Examination:    General-appears in no acute distress  Heart-S1-S2, regular, no murmur auscultated  Lungs-clear to auscultation bilaterally, no wheezing or crackles auscultated  Abdomen-soft, nontender, no organomegaly  Extremities-left knee edematous, warm to touch  Neuro-alert, oriented x3, no focal deficit noted   Status is: Inpatient  Dispo: The patient is from: Home              Anticipated d/c is to: To be decided              Anticipated d/c date is: 04/02/2021              Patient currently not stable for discharge  Barrier to discharge-ongoing evaluation for left knee septic arthritis  COVID-19 Labs  No results for input(s): DDIMER, FERRITIN, LDH, CRP in the last 72 hours.  Lab Results  Component Value Date   [place] NEGATIVE 03/28/2021   [place] NEGATIVE 03/08/2021    Microbiology  Recent Results (from the past 240 hour(s))  Urine culture     Status: None   Collection Time: 03/28/21  9:49 PM   Specimen: In/Out Cath Urine  Result Value Ref Range Status   Specimen Description IN/OUT CATH URINE  Final   Special Requests NONE  Final   Culture   Final    NO GROWTH Performed at [facility], [address], [place], [place] [address]    Report Status 03/30/2021 FINAL  Final  Resp Panel by RT-PCR (Flu A&B, Covid) Nasopharyngeal Swab     Status: None   Collection Time: 03/28/21 10:35 PM   Specimen: Nasopharyngeal Swab; Nasopharyngeal(NP) swabs in vial transport medium  Result Value Ref Range Status   SARS Coronavirus 2 by RT PCR NEGATIVE NEGATIVE Final    Comment: (NOTE) SARS-CoV-2 target nucleic acids are NOT DETECTED.  The SARS-CoV-2 RNA is generally detectable in upper respiratory specimens during the acute phase of infection. The lowest concentration of SARS-CoV-2 viral copies this assay can detect is 138 copies/mL. A negative result  does not preclude SARS-Cov-2 infection and should not be used as the sole basis for treatment or other patient management decisions.  A negative result may occur with  improper specimen collection/handling, submission of specimen other than nasopharyngeal swab, presence of viral mutation(s) within the areas targeted by this assay, and inadequate number of viral copies(<138 copies/mL). A negative result must be combined with clinical observations, patient history, and epidemiological information. The expected result is Negative.  Fact Sheet for Patients:  EntrepreneurPulse.com.au  Fact Sheet for Healthcare Providers:  IncredibleEmployment.be  This test is no t yet approved or cleared by the [place] FDA and  has been authorized for detection and/or diagnosis of SARS-CoV-2 by FDA under an Emergency Use Authorization (EUA). This EUA will remain  in effect (meaning this test can be used) for the duration of the COVID-19 declaration under Section 564(b)(1) of the Act, 21 U.S.C.section 360bbb-3(b)(1), unless the authorization is terminated  or revoked sooner.       Influenza A by PCR NEGATIVE NEGATIVE Final   Influenza B by PCR NEGATIVE NEGATIVE Final    Comment: (NOTE) The Xpert Xpress SARS-CoV-2/FLU/RSV plus assay is intended as an aid in the diagnosis of influenza from Nasopharyngeal swab specimens and should not be used as a sole basis for treatment. Nasal washings and aspirates are unacceptable for Xpert Xpress SARS-CoV-2/FLU/RSV testing.  Fact Sheet for Patients: EntrepreneurPulse.com.au  Fact Sheet for Healthcare Providers: IncredibleEmployment.be  This test is not yet approved or cleared by the [place] FDA and has been authorized for detection and/or diagnosis of SARS-CoV-2 by FDA under an Emergency Use Authorization (EUA). This EUA will remain in effect (meaning this test can be used) for  the duration of the COVID-19 declaration under Section 564(b)(1) of the Act, 21 U.S.C. section 360bbb-3(b)(1), unless the authorization is terminated or revoked.  Performed at [facility], [place] [address], [place], [place] [address]   Blood Culture (routine x 2)     Status: Abnormal (Preliminary result)   Collection Time: 03/28/21 11:30 PM   Specimen: BLOOD LEFT FOREARM  Result Value Ref Range Status   Specimen Description BLOOD LEFT FOREARM  Final   Special Requests   Final    BOTTLES DRAWN AEROBIC AND ANAEROBIC Blood Culture results may not be optimal due to an inadequate volume of blood received in culture bottles   Culture  Setup Time   Final    GRAM POSITIVE COCCI IN CLUSTERS IN BOTH AEROBIC AND ANAEROBIC BOTTLES CRITICAL RESULT CALLED TO, READ BACK BY AND VERIFIED WITH: PHARMD HALEY V. [id number] FCP    Culture (A)  Final    STAPHYLOCOCCUS AUREUS SUSCEPTIBILITIES TO FOLLOW Performed at [facility], [place] [address] [place], [place] [address]    Report Status PENDING  Incomplete  Blood Culture ID Panel (Reflexed)     Status: Abnormal   Collection Time: 03/28/21 11:30 PM  Result Value Ref Range Status   Enterococcus faecalis NOT DETECTED NOT DETECTED Final   Enterococcus Faecium NOT DETECTED NOT DETECTED Final   Listeria monocytogenes NOT DETECTED NOT DETECTED Final   Staphylococcus species DETECTED (A) NOT DETECTED Final    Comment: CRITICAL RESULT CALLED TO, READ BACK BY AND VERIFIED WITH: PHARMD HALEY V. [id number] FCP    Staphylococcus aureus (BCID) DETECTED (A) NOT DETECTED Final    Comment: Methicillin (oxacillin)-resistant Staphylococcus aureus (MRSA). MRSA is predictably resistant to beta-lactam antibiotics (except ceftaroline). Preferred therapy is vancomycin unless clinically contraindicated. Patient requires contact precautions if  hospitalized. CRITICAL RESULT CALLED TO, READ BACK BY AND VERIFIED WITH: PHARMD HALEY V. [id number] FCP  Staphylococcus epidermidis NOT DETECTED NOT DETECTED Final   Staphylococcus lugdunensis NOT DETECTED NOT DETECTED Final   Streptococcus species NOT DETECTED NOT DETECTED Final   Streptococcus agalactiae NOT DETECTED NOT DETECTED Final   Streptococcus pneumoniae NOT DETECTED NOT DETECTED Final   Streptococcus pyogenes NOT DETECTED NOT DETECTED Final   A.calcoaceticus-baumannii NOT DETECTED NOT DETECTED Final   Bacteroides fragilis NOT DETECTED NOT DETECTED Final   Enterobacterales NOT DETECTED NOT DETECTED Final   Enterobacter cloacae complex NOT DETECTED NOT DETECTED Final   Escherichia coli NOT DETECTED NOT DETECTED Final   Klebsiella aerogenes NOT DETECTED NOT DETECTED Final   Klebsiella oxytoca NOT DETECTED NOT DETECTED Final   Klebsiella pneumoniae NOT DETECTED NOT DETECTED Final   Proteus species NOT DETECTED NOT DETECTED Final   Salmonella species NOT DETECTED NOT DETECTED Final   Serratia marcescens NOT DETECTED NOT DETECTED Final   Haemophilus influenzae NOT DETECTED NOT DETECTED Final   Neisseria meningitidis NOT DETECTED NOT DETECTED Final   Pseudomonas aeruginosa NOT DETECTED NOT DETECTED Final   Stenotrophomonas maltophilia NOT DETECTED NOT DETECTED Final   Candida albicans NOT DETECTED NOT DETECTED Final   Candida auris NOT DETECTED NOT DETECTED Final   Candida glabrata NOT DETECTED NOT DETECTED Final   Candida krusei NOT DETECTED NOT DETECTED Final   Candida parapsilosis NOT DETECTED NOT DETECTED Final   Candida tropicalis NOT DETECTED NOT DETECTED Final   Cryptococcus neoformans/gattii NOT DETECTED NOT DETECTED Final   Meth resistant mecA/C and MREJ DETECTED (A) NOT DETECTED Final    Comment: CRITICAL RESULT CALLED TO, READ BACK BY AND VERIFIED WITH: Jonn Shingles [id number] FCP Performed at [facility], [address] [place], [place] [address]   Body fluid culture w Gram Stain     Status: None (Preliminary result)   Collection Time: 03/29/21 12:10 AM    Specimen: Body Fluid  Result Value Ref Range Status   Specimen Description FLUID LEFT KNEE  Final   Special Requests SYRINGE  Final   Gram Stain FEW WBC SEEN NO ORGANISMS SEEN   Final   Culture   Final    NO GROWTH 1 DAY Performed at [facility], [address] [place], [place] [address]    Report Status PENDING  Incomplete  Blood Culture (routine x 2)     Status: None (Preliminary result)   Collection Time: 03/29/21  3:00 AM   Specimen: BLOOD  Result Value Ref Range Status   Specimen Description BLOOD SITE NOT SPECIFIED  Final   Special Requests   Final    BOTTLES DRAWN AEROBIC AND ANAEROBIC Blood Culture adequate volume   Culture   Final    NO GROWTH 1 DAY Performed at [facility], [place] [address], [place], [place] [address]    Report Status PENDING  Incomplete        Oswald Hillock   [facility] If 7PM-7AM, please contact night-coverage at [email or web address], Office  [phone]   03/30/2021, 3:58 PM  LOS: 1 day

## 2021-03-30 NOTE — Progress Notes (Signed)
[facility] Progress Note   Subjective:  Dialysis yesterday net UF 3L  Blood cx + MRSA. Dialysis catheter removed today Afebrile. Feels better today. No cp, sob.   Objective Vitals:   03/30/21 0105 03/30/21 0510 03/30/21 0730 03/30/21 1151  BP: 139/71 137/70 107/82 114/64  Pulse: 86 88 92 90  Resp: '18 20 17 19  '$ Temp: 99.1 F (37.3 C) 99.2 F (37.3 C) 99.7 F (37.6 C) 98.4 F (36.9 C)  TempSrc: Rectal Rectal Oral Oral  SpO2: 100% 100% 92% 96%  Weight:      Height:         Additional Objective Labs: Basic Metabolic Panel: Recent Labs  Lab 03/28/21 2149 03/29/21 1050 03/30/21 0607  NA 129* 127* 128*  K 3.7 3.3* 4.2  CL 88* 88* 87*  CO2 '26 23 25  '$ GLUCOSE 257* 447* 311*  BUN 98* 102* 66*  CREATININE 4.78* 4.89* 4.51*  CALCIUM 8.9 8.3* 8.5*   CBC: Recent Labs  Lab 03/28/21 2149 03/29/21 1050 03/30/21 0607  WBC 12.3* 12.6* 10.2  NEUTROABS 10.8*  --   --   HGB 10.5* 9.8* 10.4*  HCT 32.5* 30.5* 32.5*  MCV 91.0 92.4 92.6  PLT 139* 112* 90*   Blood Culture    Component Value Date/Time   SDES BLOOD SITE NOT SPECIFIED 03/29/2021 0300   SPECREQUEST  03/29/2021 0300    BOTTLES DRAWN AEROBIC AND ANAEROBIC Blood Culture adequate volume   CULT  03/29/2021 0300    NO GROWTH 1 DAY Performed at [facility], [place] [address], [place], [place] [address]    REPTSTATUS PENDING 03/29/2021 0300     Physical Exam General: Well appearing, nad  Heart: RRR no m,r,g  Lungs: Clear bilaterally  Abdomen: soft non-tender  Extremities: L knee s/p TKR, mild swelling, tender to palpation  Dialysis Access: RUE AVG +bruit; no erythema, warmth, tenderness   Medications: . vancomycin Stopped (03/29/21 1732)   . amLODipine  5 mg Oral QHS  . aspirin  81 mg Oral Daily  . atorvastatin  40 mg Oral QHS  . calcium acetate  1,334 mg Oral TID with meals  . carvedilol  12.5 mg Oral BID WC  . chlorhexidine      . Chlorhexidine Gluconate Cloth  6 each Topical  Q0600  . heparin  5,000 Units Subcutaneous Q8H  . insulin aspart  0-15 Units Subcutaneous TID WC  . insulin aspart  0-5 Units Subcutaneous QHS  . insulin glargine  10 Units Subcutaneous QHS  . lidocaine      . pantoprazole  20 mg Oral Daily  . sertraline  100 mg Oral Daily    Dialysis Orders:   [facility] MWF    4h  121kg  3K/2.5 bath  RUE AVG/ R IJ TDC  Hep 4500+ 2556md  - mircera 100 ug q2 last 4/27     Na 127  K 3.3 CO23  BUN 102  Cr 4.89  WBC 12K Hb 9.8     BP 159/60  HR 110- 125  Temp 102- 103   RR 18-25    CXR 5/01 - IMPRESSION: Low volumes with basilar opacities which may reflect atelectasis though edema or infection could have a similar appearance in the appropriate clinical setting.  Assessment/Plan: 1. MRSA bacteremia Fevers/ chills on admission -high fever in ED 103 deg. HD cath sepsis and/or septic joint L knee. BCx 5/1 +MRSA;  Knee fluid cx -ngtd. IV Vanc per pharmacy. TDC removed 5/3. Ortho consulted  2. ESRD - on HD MWF. Next HD 5/4.  3. HD access - [facility] placed Nov 2021 at [place], [place] AVG placed 01/30/21 also at [facility]. Had fistulogram 4/14 that showed patent AVG. TDC removed per IR 5/3.  4. HTN/ vol - up by wt's, no edema on CXR. Nasal O2, not sure if uses O2 at home. Tolerated 3L UF 5/2. Attempt 3-4L next HD.  5. PTSD - on zoloft and others 6. COPD - per pmd 7. DM2 - per pmd, not on medication per pta list 8. Anemia ckd - last esa on 4/27, due on 5/11 if still here.  9. MBD ckd - Ca ok, cont phoslo, not on vdra   Lynnda Child PA-C [place] 03/30/2021,1:24 PM

## 2021-03-30 NOTE — Progress Notes (Addendum)
Inpatient Diabetes Program Recommendations  AACE/ADA: New Consensus Statement on Inpatient Glycemic Control (2015)  Target Ranges:  Prepandial:   less than 140 mg/dL      Peak postprandial:   less than 180 mg/dL (1-2 hours)      Critically ill patients:  140 - 180 mg/dL   Lab Results  Component Value Date   GLUCAP 298 (H) 03/30/2021   HGBA1C 8.2 (H) 03/29/2021    Review of Glycemic Control Results for Jeffrey [name], Jeffrey [name] (MRN [id number]) as of 03/30/2021 11:20  Ref. Range 03/29/2021 09:07 03/29/2021 09:55 03/29/2021 11:31 03/29/2021 12:15 03/29/2021 18:14 03/29/2021 21:54 03/30/2021 07:51  Glucose-Capillary Latest Ref Range: 70 - 99 mg/dL 397 (H) 413 (H) 390 (H) 400 (H) 218 (H) 271 (H) 298 (H)   Diabetes history: DM 2 Outpatient Diabetes medications: Concentrated Humulin R U-500 120 units qam, 60 units qpm Current orders for Inpatient glycemic control:  Lantus 10 units Novolog 0-15 units tid + hs  Inpatient Diabetes Program Recommendations:    Pt on concentrated insulin at home with large doses  -  Increase Lantus to 30 units -  Add Novolog 5 units tid meal coverage   Thanks,  Tama Headings RN, MSN, BC-ADM Inpatient Diabetes Coordinator Team Pager [phone] (8a-5p)

## 2021-03-30 NOTE — Procedures (Signed)
PROCEDURE SUMMARY:  Successful removal of tunneled hemodialysis catheter.  Patient tolerated well.  EBL < 5 mL  See full dictation in Imaging for details.  Armando Gang Leverne Amrhein PA-C 03/30/2021 11:03 AM

## 2021-03-31 ENCOUNTER — Inpatient Hospital Stay (HOSPITAL_COMMUNITY): Payer: Medicare Other

## 2021-03-31 DIAGNOSIS — B9562 Methicillin resistant Staphylococcus aureus infection as the cause of diseases classified elsewhere: Secondary | ICD-10-CM | POA: Diagnosis not present

## 2021-03-31 DIAGNOSIS — T8454XD Infection and inflammatory reaction due to internal left knee prosthesis, subsequent encounter: Secondary | ICD-10-CM | POA: Diagnosis not present

## 2021-03-31 DIAGNOSIS — N186 End stage renal disease: Secondary | ICD-10-CM | POA: Diagnosis not present

## 2021-03-31 DIAGNOSIS — Z95828 Presence of other vascular implants and grafts: Secondary | ICD-10-CM | POA: Diagnosis not present

## 2021-03-31 DIAGNOSIS — R7881 Bacteremia: Secondary | ICD-10-CM | POA: Diagnosis not present

## 2021-03-31 DIAGNOSIS — T8454XA Infection and inflammatory reaction due to internal left knee prosthesis, initial encounter: Secondary | ICD-10-CM

## 2021-03-31 LAB — CBC
HCT: 25.6 % — ABNORMAL LOW (ref 39.0–52.0)
Hemoglobin: 8.4 g/dL — ABNORMAL LOW (ref 13.0–17.0)
MCH: 30.2 pg (ref 26.0–34.0)
MCHC: 32.8 g/dL (ref 30.0–36.0)
MCV: 92.1 fL (ref 80.0–100.0)
Platelets: 86 10*3/uL — ABNORMAL LOW (ref 150–400)
RBC: 2.78 MIL/uL — ABNORMAL LOW (ref 4.22–5.81)
RDW: 16.8 % — ABNORMAL HIGH (ref 11.5–15.5)
WBC: 8.5 10*3/uL (ref 4.0–10.5)
nRBC: 0 % (ref 0.0–0.2)

## 2021-03-31 LAB — GLUCOSE, CAPILLARY
Glucose-Capillary: 344 mg/dL — ABNORMAL HIGH (ref 70–99)
Glucose-Capillary: 180 mg/dL — ABNORMAL HIGH (ref 70–99)
Glucose-Capillary: 317 mg/dL — ABNORMAL HIGH (ref 70–99)
Glucose-Capillary: 266 mg/dL — ABNORMAL HIGH (ref 70–99)

## 2021-03-31 LAB — COMPREHENSIVE METABOLIC PANEL
ALT: 19 U/L (ref 0–44)
AST: 19 U/L (ref 15–41)
Albumin: 2.9 g/dL — ABNORMAL LOW (ref 3.5–5.0)
Alkaline Phosphatase: 63 U/L (ref 38–126)
Anion gap: 13 (ref 5–15)
BUN: 94 mg/dL — ABNORMAL HIGH (ref 6–20)
CO2: 24 mmol/L (ref 22–32)
Calcium: 8.5 mg/dL — ABNORMAL LOW (ref 8.9–10.3)
Chloride: 88 mmol/L — ABNORMAL LOW (ref 98–111)
Creatinine, Ser: 4.98 mg/dL — ABNORMAL HIGH (ref 0.61–1.24)
GFR, Estimated: 13 mL/min — ABNORMAL LOW (ref >60)
Glucose, Bld: 358 mg/dL — ABNORMAL HIGH (ref 70–99)
Potassium: 3.4 mmol/L — ABNORMAL LOW (ref 3.5–5.1)
Sodium: 125 mmol/L — ABNORMAL LOW (ref 135–145)
Total Bilirubin: 0.7 mg/dL (ref 0.3–1.2)
Total Protein: 7.2 g/dL (ref 6.5–8.1)

## 2021-03-31 LAB — CULTURE, BLOOD (ROUTINE X 2)

## 2021-03-31 LAB — PHOSPHORUS: Phosphorus: 6.8 mg/dL — ABNORMAL HIGH (ref 2.5–4.6)

## 2021-03-31 MED ORDER — VANCOMYCIN HCL IN DEXTROSE 1-5 GM/200ML-% IV SOLN
INTRAVENOUS | Status: AC
  Administered 2021-03-31: 1000 mg via INTRAVENOUS
  Filled 2021-03-31: qty 200

## 2021-03-31 MED ORDER — NORTRIPTYLINE HCL 10 MG PO CAPS
10.0000 mg | ORAL_CAPSULE | Freq: Every day | ORAL | Status: DC
  Administered 2021-03-31 – 2021-04-09 (×10): 10 mg via ORAL
  Filled 2021-03-31 (×10): qty 1

## 2021-03-31 MED ORDER — INSULIN REGULAR HUMAN (CONC) 500 UNIT/ML ~~LOC~~ SOPN
95.0000 [IU] | PEN_INJECTOR | Freq: Every day | SUBCUTANEOUS | Status: DC
  Administered 2021-04-01: 95 [IU] via SUBCUTANEOUS
  Filled 2021-03-31: qty 3

## 2021-03-31 MED ORDER — DIAZEPAM 2 MG PO TABS: 2.0000 mg | ORAL_TABLET | Freq: Two times a day (BID) | ORAL | Status: DC | PRN

## 2021-03-31 MED ORDER — HYDROMORPHONE HCL 1 MG/ML IJ SOLN
INTRAMUSCULAR | Status: AC
  Administered 2021-03-31: 1 mg via INTRAVENOUS
  Filled 2021-03-31: qty 1

## 2021-03-31 MED ORDER — CALCIUM ACETATE (PHOS BINDER) 667 MG PO CAPS
667.0000 mg | ORAL_CAPSULE | Freq: Three times a day (TID) | ORAL | Status: DC
  Administered 2021-03-31 – 2021-04-01 (×4): 667 mg via ORAL
  Filled 2021-03-31 (×4): qty 1

## 2021-03-31 MED ORDER — INSULIN REGULAR HUMAN (CONC) 500 UNIT/ML ~~LOC~~ SOPN
45.0000 [IU] | PEN_INJECTOR | Freq: Every day | SUBCUTANEOUS | Status: DC
  Administered 2021-03-31 – 2021-04-01 (×2): 45 [IU] via SUBCUTANEOUS
  Filled 2021-03-31: qty 3

## 2021-03-31 MED ORDER — INSULIN GLARGINE 100 UNIT/ML ~~LOC~~ SOLN
30.0000 [IU] | Freq: Two times a day (BID) | SUBCUTANEOUS | Status: DC
  Filled 2021-03-31: qty 0.3

## 2021-03-31 MED ORDER — INSULIN GLARGINE 100 UNIT/ML ~~LOC~~ SOLN
30.0000 [IU] | Freq: Two times a day (BID) | SUBCUTANEOUS | Status: DC
  Filled 2021-03-31 (×2): qty 0.3

## 2021-03-31 NOTE — Progress Notes (Addendum)
[facility] Progress Note   Subjective:  Seen in HD unit, just starting dialysis No events overnight. Afebrile, no cp, sob.   Objective Vitals:   03/31/21 0845 03/31/21 0850 03/31/21 0900 03/31/21 0930  BP: (!) 130/34 (!) 114/38 (!) 112/44 (!) 119/42  Pulse: 88   85  Resp:      Temp: 98.7 F (37.1 C)     TempSrc: Oral     SpO2:      Weight: 125.8 kg     Height:         Additional Objective Labs: Basic Metabolic Panel: Recent Labs  Lab 03/29/21 1050 03/30/21 0607 03/31/21 0500  NA 127* 128* 125*  K 3.3* 4.2 3.4*  CL 88* 87* 88*  CO2 '23 25 24  '$ GLUCOSE 447* 311* 358*  BUN 102* 66* 94*  CREATININE 4.89* 4.51* 4.98*  CALCIUM 8.3* 8.5* 8.5*   CBC: Recent Labs  Lab 03/28/21 2149 03/29/21 1050 03/30/21 0607 03/31/21 0500  WBC 12.3* 12.6* 10.2 8.5  NEUTROABS 10.8*  --   --   --   HGB 10.5* 9.8* 10.4* 8.4*  HCT 32.5* 30.5* 32.5* 25.6*  MCV 91.0 92.4 92.6 92.1  PLT 139* 112* 90* 86*   Blood Culture    Component Value Date/Time   SDES BLOOD SITE NOT SPECIFIED 03/29/2021 0300   SPECREQUEST  03/29/2021 0300    BOTTLES DRAWN AEROBIC AND ANAEROBIC Blood Culture adequate volume   CULT  03/29/2021 0300    NO GROWTH 2 DAYS Performed at [facility], [place] [address] [place], [place] [address]    REPTSTATUS PENDING 03/29/2021 0300     Physical Exam General: Well appearing, nad  Heart: RRR no m,r,g  Lungs: Clear bilaterally  Abdomen: soft non-tender  Extremities: L knee s/p TKR, mild swelling, tender to palpation  Dialysis Access: RUE AVG +bruit; no erythema, warmth, tenderness   Medications: . vancomycin Stopped (03/29/21 1732)   . amLODipine  5 mg Oral QHS  . aspirin  81 mg Oral Daily  . atorvastatin  40 mg Oral QHS  . calcium acetate  1,334 mg Oral TID with meals  . carvedilol  12.5 mg Oral BID WC  . Chlorhexidine Gluconate Cloth  6 each Topical Q0600  . heparin  5,000 Units Subcutaneous Q8H  . insulin aspart  0-15 Units  Subcutaneous TID WC  . insulin aspart  0-5 Units Subcutaneous QHS  . insulin aspart  5 Units Subcutaneous TID WC  . insulin glargine  30 Units Subcutaneous QHS  . pantoprazole  20 mg Oral Daily  . sertraline  100 mg Oral Daily    Dialysis Orders:   [facility] MWF    4h  121kg  3K/2.5 bath  RUE AVG/ R IJ TDC  Hep 4500+ 2581md  - mircera 100 ug q2 last 4/27     Na 127  K 3.3 CO23  BUN 102  Cr 4.89  WBC 12K Hb 9.8     BP 159/60  HR 110- 125  Temp 102- 103   RR 18-25    CXR 5/01 - IMPRESSION: Low volumes with basilar opacities which may reflect atelectasis though edema or infection could have a similar appearance in the appropriate clinical setting.  Assessment/Plan: 1. MRSA bacteremia Fevers/ chills on admission -high fever in ED 103 deg. HD cath sepsis and/or septic joint L knee. BCx 5/1 +MRSA;  Knee fluid cx -ngtd. IV Vanc per pharmacy. TDC removed 5/3. Should not need another TDC seen the  AVG has been working well. Ortho consulted  2. ESRD - on HD MWF. HD today on schedule.  3. HD access - [facility] placed Nov 2021 at [place], [place] AVG placed 01/30/21 also at [facility]. Had fistulogram 4/14 that showed patent AVG. TDC removed per IR 5/3.  4. HTN/ vol - up by wt's, no edema on CXR. Nasal O2, not sure if uses O2 at home. Tolerated 3L UF 5/2. Attempt 3-4L next HD.  5. PTSD - on zoloft and others 6. COPD - per pmd 7. DM2 - per pmd, not on medication per pta list 8. Anemia ckd - Hgb 9.8 >10.6>8.4 Last esa on 4/27, due on 5/11 if still here.   9. MBD ckd - Ca ok, cont phoslo, not on vdra. Check Phos here.    Lynnda Child PA-C [facility] 03/31/2021,9:44 AM  Pt seen, examined and agree w assess/plan as above with additions as indicated.  [facility] 03/31/2021, 3:35 PM

## 2021-03-31 NOTE — Plan of Care (Signed)

## 2021-03-31 NOTE — Progress Notes (Signed)
Ok to resume home U500 at the reduced dose per diabetes coordinator recommendation.   U500 95 units qbreakfast U500 45 units qdinner  Onnie Boer, PharmD, [place], AAHIVP, CPP Infectious Disease Pharmacist 03/31/2021 2:38 PM

## 2021-03-31 NOTE — Progress Notes (Signed)
[place] for Infectious Disease  Date of Admission:  03/28/2021     Total days of antibiotics 4         ASSESSMENT:  Mr. Meston is awaiting TTE to rule out endocarditis. Will need repeat blood cultures to determine clearance of bacteremia. Attempting to arrange this to be completed with dialysis. Awaiting further evaluation by orthopedics for possible AKA. Expressed he does not wish to take pills and he is interested in possibly pursuing an AKA. This would provide likely source control as this is the most likely nidus of infection, although cannot rule out removed dialysis catheter. Continue current dose of vancomycin with dialysis.   PLAN:  1. Continue vancomycin with dialysis. 2. Need repeat blood cultures to determine clearance of bacteremia. 3. TTE pending and TEE ordered.  4. Await orthopedic evaluation of left knee.   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 amLODipine  5 mg Oral QHS  . aspirin  81 mg Oral Daily  . atorvastatin  40 mg Oral QHS  . calcium acetate  1,334 mg Oral TID with meals  . carvedilol  12.5 mg Oral BID WC  . Chlorhexidine Gluconate Cloth  6 each Topical Q0600  . heparin  5,000 Units Subcutaneous Q8H  . insulin aspart  0-15 Units Subcutaneous TID WC  . insulin aspart  0-5 Units Subcutaneous QHS  . insulin aspart  5 Units Subcutaneous TID WC  . insulin glargine  30 Units Subcutaneous QHS  . pantoprazole  20 mg Oral Daily  . sertraline  100 mg Oral Daily    SUBJECTIVE:  Afebrile overnight with no acute events. Frustrated about continued infections. Denies fevers.    Allergies  Allergen Reactions  . Bupropion Swelling  . Dexmedetomidine Nausea And Vomiting    Other reaction(s): Other (See Comments) Dose-limiting bradycardia Dose-limiting bradycardia   . Ibuprofen Shortness Of Breath and Swelling    Pt tolerates aspirin Pt tolerates aspirin   . Pioglitazone Anaphylaxis and Rash  . Tomato Anaphylaxis     Review of Systems: Review of Systems  Constitutional: Negative for chills, fever and weight loss.  Respiratory: Negative for cough, shortness of breath and wheezing.   Cardiovascular: Negative for chest pain and leg swelling.  Gastrointestinal: Negative for abdominal pain, constipation, diarrhea, nausea and vomiting.  Musculoskeletal:       Positive for right knee pain  Skin: Negative for rash.      OBJECTIVE: Vitals:   03/31/21 0026 03/31/21 0200 03/31/21 0540 03/31/21 0800  BP: (!) 135/114 129/71 (!) 151/81 (!) 130/52  Pulse: 86  86 83  Resp: '16  15 20  '$ Temp: 98 F (36.7 C)  98.5 F (36.9 C) 99 F (37.2 C)  TempSrc: Axillary  Axillary Oral  SpO2: 98%  100% 100%  Weight:      Height:       Body mass index is 46.11 kg/m.  Physical Exam Constitutional:      General: He is not in acute distress.    Appearance: He is well-developed.  Cardiovascular:     Rate and Rhythm: Normal rate and regular rhythm.  Heart sounds: Normal heart sounds.  Pulmonary:     Effort: Pulmonary effort is normal.     Breath sounds: Normal breath sounds.  Skin:    General: Skin is warm and dry.  Neurological:     Mental Status: He is alert and oriented to person, place, and time.     Lab Results Lab Results  Component Value Date   WBC 10.2 03/30/2021   HGB 10.4 (L) 03/30/2021   HCT 32.5 (L) 03/30/2021   MCV 92.6 03/30/2021   PLT 90 (L) 03/30/2021    Lab Results  Component Value Date   CREATININE 4.51 (H) 03/30/2021   BUN 66 (H) 03/30/2021   NA 128 (L) 03/30/2021   K 4.2 03/30/2021   CL 87 (L) 03/30/2021   CO2 25 03/30/2021     Lab Results  Component Value Date   ALT 21 03/30/2021   AST 31 03/30/2021   ALKPHOS 63 03/30/2021   BILITOT 0.7 03/30/2021     Microbiology: Recent Results (from the past 240 hour(s))  Urine culture     Status: None   Collection Time: 03/28/21  9:49 PM   Specimen: In/Out Cath Urine  Result Value Ref Range Status   Specimen Description IN/OUT CATH URINE  Final   Special Requests NONE  Final   Culture   Final    NO GROWTH Performed at [facility], [address], [place], [place] [address]    Report Status 03/30/2021 FINAL  Final  Resp Panel by RT-PCR (Flu A&B, Covid) Nasopharyngeal Swab     Status: None   Collection Time: 03/28/21 10:35 PM   Specimen: Nasopharyngeal Swab; Nasopharyngeal(NP) swabs in vial transport medium  Result Value Ref Range Status   SARS Coronavirus 2 by RT PCR NEGATIVE NEGATIVE Final    Comment: (NOTE) SARS-CoV-2 target nucleic acids are NOT DETECTED.  The SARS-CoV-2 RNA is generally detectable in upper respiratory specimens during the acute phase of infection. The lowest concentration of SARS-CoV-2 viral copies this assay can detect is 138 copies/mL. A negative result does not preclude SARS-Cov-2 infection and should not be used as the sole basis for treatment or other patient management decisions. A negative result may occur with  improper specimen collection/handling, submission of specimen other than nasopharyngeal swab, presence of viral mutation(s) within the areas targeted by this assay, and inadequate number of viral copies(<138 copies/mL). A negative result must be combined with clinical observations, patient history, and epidemiological information. The expected result is Negative.  Fact Sheet for Patients:  EntrepreneurPulse.com.au  Fact Sheet for Healthcare Providers:  IncredibleEmployment.be  This test is no t yet approved or cleared by the [place] FDA and  has been authorized for  detection and/or diagnosis of SARS-CoV-2 by FDA under an Emergency Use Authorization (EUA). This EUA will remain  in effect (meaning this test can be used) for the duration of the COVID-19 declaration under Section 564(b)(1) of the Act, 21 U.S.C.section 360bbb-3(b)(1), unless the authorization is terminated  or revoked sooner.       Influenza A by PCR NEGATIVE NEGATIVE Final   Influenza B by PCR NEGATIVE NEGATIVE Final    Comment: (NOTE) The Xpert Xpress SARS-CoV-2/FLU/RSV plus assay is intended as an aid in the diagnosis of influenza from Nasopharyngeal swab specimens and should not be used as a sole basis for treatment. Nasal washings and aspirates are unacceptable for Xpert Xpress SARS-CoV-2/FLU/RSV testing.  Fact Sheet for Patients: EntrepreneurPulse.com.au  Fact Sheet for Healthcare Providers: IncredibleEmployment.be  This test is not yet approved or cleared by the [place] and has been authorized for detection and/or diagnosis of SARS-CoV-2 by FDA under an Emergency Use Authorization (EUA). This EUA will remain in effect (meaning this test can be used) for the duration of the COVID-19 declaration under Section 564(b)(1) of the Act, 21 U.S.C. section 360bbb-3(b)(1), unless the authorization is terminated or revoked.  Performed at [facility], [place] [address] [place], [place] [address]   Blood Culture (routine x 2)     Status: Abnormal   Collection Time: 03/28/21 11:30 PM   Specimen: BLOOD LEFT FOREARM  Result Value Ref Range Status   Specimen Description BLOOD LEFT FOREARM  Final   Special Requests   Final    BOTTLES DRAWN AEROBIC AND ANAEROBIC Blood Culture results may not be optimal due to an inadequate volume of blood received in culture bottles   Culture  Setup Time   Final    GRAM POSITIVE COCCI IN CLUSTERS IN BOTH AEROBIC AND ANAEROBIC BOTTLES CRITICAL RESULT CALLED TO, READ BACK BY AND VERIFIED WITH: [place] [id number] FCP Performed at [facility], [place] [address] [place], [place] [address]    Culture METHICILLIN RESISTANT STAPHYLOCOCCUS AUREUS (A)  Final   Report Status 03/31/2021 FINAL  Final   Organism ID, Bacteria METHICILLIN RESISTANT STAPHYLOCOCCUS AUREUS  Final      Susceptibility   Methicillin resistant staphylococcus aureus - MIC*    CIPROFLOXACIN >=8 RESISTANT Resistant     ERYTHROMYCIN >=8 RESISTANT Resistant     GENTAMICIN <=0.5 SENSITIVE Sensitive     OXACILLIN >=4 RESISTANT Resistant     TETRACYCLINE <=1 SENSITIVE Sensitive     VANCOMYCIN <=0.5 SENSITIVE Sensitive     TRIMETH/SULFA <=10 SENSITIVE Sensitive     CLINDAMYCIN <=0.25 SENSITIVE Sensitive     RIFAMPIN <=0.5 SENSITIVE Sensitive     Inducible Clindamycin NEGATIVE Sensitive     * METHICILLIN RESISTANT STAPHYLOCOCCUS AUREUS  Blood Culture ID Panel (Reflexed)     Status: Abnormal   Collection Time: 03/28/21 11:30 PM  Result Value Ref Range Status   Enterococcus faecalis NOT DETECTED NOT DETECTED Final   Enterococcus Faecium NOT DETECTED NOT DETECTED Final   Listeria monocytogenes NOT DETECTED NOT DETECTED Final   Staphylococcus species DETECTED (A) NOT DETECTED Final    Comment: CRITICAL RESULT CALLED TO, READ BACK BY AND VERIFIED WITH: PHARMD HALEY V. [id number] FCP    Staphylococcus aureus (BCID) DETECTED (A) NOT DETECTED Final    Comment: Methicillin (oxacillin)-resistant Staphylococcus aureus (MRSA). MRSA is predictably resistant to beta-lactam antibiotics (except ceftaroline). Preferred therapy is vancomycin unless clinically contraindicated. Patient requires contact precautions if  hospitalized. CRITICAL RESULT CALLED TO, READ BACK BY AND VERIFIED WITH: PHARMD HALEY V. [id number] FCP    Staphylococcus epidermidis NOT DETECTED NOT DETECTED Final   Staphylococcus lugdunensis NOT DETECTED NOT DETECTED Final   Streptococcus species NOT DETECTED NOT DETECTED Final   Streptococcus  agalactiae NOT DETECTED NOT DETECTED Final   Streptococcus pneumoniae NOT DETECTED NOT DETECTED Final   Streptococcus pyogenes NOT DETECTED NOT DETECTED Final   A.calcoaceticus-baumannii NOT DETECTED NOT DETECTED Final   Bacteroides fragilis NOT DETECTED NOT DETECTED Final   Enterobacterales NOT DETECTED NOT DETECTED Final   Enterobacter cloacae complex NOT DETECTED NOT DETECTED Final   Escherichia coli NOT DETECTED NOT DETECTED Final   Klebsiella aerogenes NOT DETECTED NOT DETECTED Final   Klebsiella oxytoca NOT DETECTED NOT DETECTED Final  Klebsiella pneumoniae NOT DETECTED NOT DETECTED Final   Proteus species NOT DETECTED NOT DETECTED Final   Salmonella species NOT DETECTED NOT DETECTED Final   Serratia marcescens NOT DETECTED NOT DETECTED Final   Haemophilus influenzae NOT DETECTED NOT DETECTED Final   Neisseria meningitidis NOT DETECTED NOT DETECTED Final   Pseudomonas aeruginosa NOT DETECTED NOT DETECTED Final   Stenotrophomonas maltophilia NOT DETECTED NOT DETECTED Final   Candida albicans NOT DETECTED NOT DETECTED Final   Candida auris NOT DETECTED NOT DETECTED Final   Candida glabrata NOT DETECTED NOT DETECTED Final   Candida krusei NOT DETECTED NOT DETECTED Final   Candida parapsilosis NOT DETECTED NOT DETECTED Final   Candida tropicalis NOT DETECTED NOT DETECTED Final   Cryptococcus neoformans/gattii NOT DETECTED NOT DETECTED Final   Meth resistant mecA/C and MREJ DETECTED (A) NOT DETECTED Final    Comment: CRITICAL RESULT CALLED TO, READ BACK BY AND VERIFIED WITH: Jonn Shingles [id number] FCP Performed at [facility], [address] [place], [place] [address]   Body fluid culture w Gram Stain     Status: None (Preliminary result)   Collection Time: 03/29/21 12:10 AM   Specimen: Body Fluid  Result Value Ref Range Status   Specimen Description FLUID LEFT KNEE  Final   Special Requests SYRINGE  Final   Gram Stain FEW WBC SEEN NO ORGANISMS SEEN   Final    Culture   Final    NO GROWTH 1 DAY Performed at [facility], [address] [place], [place] [address]    Report Status PENDING  Incomplete  Blood Culture (routine x 2)     Status: None (Preliminary result)   Collection Time: 03/29/21  3:00 AM   Specimen: BLOOD  Result Value Ref Range Status   Specimen Description BLOOD SITE NOT SPECIFIED  Final   Special Requests   Final    BOTTLES DRAWN AEROBIC AND ANAEROBIC Blood Culture adequate volume   Culture   Final    NO GROWTH 2 DAYS Performed at [facility], [address], [place], [place] [address]    Report Status PENDING  Incomplete     Terri Piedra, NP [place] [facility]  03/31/2021  9:10 AM

## 2021-03-31 NOTE — Progress Notes (Addendum)
Inpatient Diabetes Program Recommendations  AACE/ADA: New Consensus Statement on Inpatient Glycemic Control (2015)  Target Ranges:  Prepandial:   less than 140 mg/dL      Peak postprandial:   less than 180 mg/dL (1-2 hours)      Critically ill patients:  140 - 180 mg/dL   Lab Results  Component Value Date   GLUCAP 344 (H) 03/31/2021   HGBA1C 8.2 (H) 03/29/2021    Review of Glycemic Control Results for BRILEY, TOWERS (MRN [id number]) as of 03/30/2021 11:20  Ref. Range 03/29/2021 09:07 03/29/2021 09:55 03/29/2021 11:31 03/29/2021 12:15 03/29/2021 18:14 03/29/2021 21:54 03/30/2021 07:51  Glucose-Capillary Latest Ref Range: 70 - 99 mg/dL 397 (H) 413 (H) 390 (H) 400 (H) 218 (H) 271 (H) 298 (H)   Diabetes history: DM 2 Outpatient Diabetes medications: Concentrated Humulin R U-500 120 units qam, 60 units qpm Current orders for Inpatient glycemic control:  Lantus 10 units Novolog 0-15 units tid + hs  Inpatient Diabetes Program Recommendations:    Pt on concentrated insulin at home with large doses  - may consider restarting home concentrated Humulin R U-500 dose at 95 units qam, 45 units qpm - d/c basal and meal coverage if Humulin U-500 is ordered  Will need to reorder from home med list/consider pharmacy assistance.  Thanks,  Tama Headings RN, MSN, BC-ADM Inpatient Diabetes Coordinator Team Pager [phone] (8a-5p)

## 2021-03-31 NOTE — Progress Notes (Signed)
PROGRESS NOTE    Jeffrey [name]  XFG:[id number] DOB: Dec 14, 1960 DOA: 03/28/2021 PCP: Pcp, No   Brief Narrative: 60 year old with past medical history significant for ESRD,  diabetes presents to the ED complaining of fever.  Accompanied by vomiting, diaphoresis and mild confusion.  He has been having left knee pain.  Patient has a history of chronic knee  pain and swelling left knee, prior imagine at [facility].  Patient has a history of knee replacement/hardware bilaterally.  In the ED left knee imaging showed massive knee joint effusion.  Arthrocentesis was performed however only 1 cc of gelatinous red fluid was obtained by ED provider.  Patient was started empirically on IV antibiotics.  Patient was evaluated by orthopedic, who is recommending AKA>  Patient also diagnosed with MRSA bacteremia, underwent hemodialysis catheter removal.  Currently has RUE AVG (01/2021).  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 -Need repeat blood cultures to document clearance, cultures ordered by ID. ID will provide blood culture kit to HD units for blood culture to be drawn.  -2D echo pending, ID recommend TEE.    2-Left prosthetic knee septic arthritis Arthrocentesis performed by ED only 1 cc gelatinous red fluid obtained.  Culture from arthrocentesis: no Growth to date.  CT; Irregular lucency around the tibial processes which may reflect hardware loosening or infection.  Not a specific moderate sized knee joint effusion. -Dr  Jeffrey [name] will follow up on patient this afternoon. I have contacted Dr Jeffrey [name].   3-Diabetes type 2: Change Lantus to twice daily. Continue with meal coverage and a sliding scale insulin.  4-Severe Sepsis: Secondary to septic arthritis, patient presents with Fever, tachycardia, tachypnea.  Found to have MRSA Bacteremia.  Treated with IV antibiotics, fluids.   5-ESRD on HD;  HD catheter removed.  Getting HD today.   6-Hypersomnolence: Resolved.  Gabapentin, Nortriptyline trazodone, valium on hold.  -plan to resume Nortriptyline and valium lower dose PRN.   7-Thrombocytopenia; secondary to infectious process. Monitor  8-HTN; Continue with coreg, amlodipine.      Estimated body mass index is 46.15 kg/m as calculated from the following:   Height as of this encounter: _0  (1.651 m).   Weight as of this encounter: 125.8 kg.   DVT prophylaxis: Heparin  Code Status: Full code Family Communication: Care discussed with patient and wife who was at bedside.  Disposition Plan:  Status is: Inpatient  Remains inpatient appropriate because:IV treatments appropriate due to intensity of illness or inability to take PO   Dispo: The patient is from: Home              Anticipated d/c is to: to be determine              Patient currently is not medically stable to d/c.   Difficult to place patient No        Consultants:   ID  Nephrology  Orthopoedic  Procedures:   HD  Antimicrobials:  Vancomycin 03/29/2021  Subjective: He is complaining still of knee pain.  He would like to speak with Orthopedic, if he needs to have surgery, he wants to have done.  He would like to use regular human insulin U 500.   Objective: Vitals:   03/31/21 1130 03/31/21 1150 03/31/21 1200 03/31/21 1205  BP: (!) 125/43 (!) 126/42 103/71 103/71  Pulse:      Resp:  (!) 21  14  Temp:      TempSrc:      SpO2:      Weight:      Height:       No intake or output data in the 24 hours ending  03/31/21 1326 Filed Weights   03/29/21 1326 03/29/21 1730 03/31/21 0845  Weight: 128.9 kg 125.7 kg 125.8 kg    Examination:  General exam: Appears calm and comfortable  Respiratory system: Clear to auscultation. Respiratory effort normal. Cardiovascular system: S1 & S2 heard, RRR. No JVD, murmurs, rubs, gallops or clicks. No pedal edema. Gastrointestinal system: Abdomen is nondistended, soft and nontender. No organomegaly or masses felt. Normal bowel sounds heard. Central nervous system: Alert and oriented. No focal neurological deficits. Extremities: Symmetric 5 x 5 power. Left knee with edema.     Data Reviewed: I have personally reviewed following labs and imaging studies  CBC: Recent Labs  Lab 03/28/21 2149 03/29/21 1050 03/30/21 0607 03/31/21 0500  WBC 12.3* 12.6* 10.2 8.5  NEUTROABS 10.8*  --   --   --   HGB 10.5* 9.8* 10.4* 8.4*  HCT 32.5* 30.5* 32.5* 25.6*  MCV 91.0 92.4 92.6 92.1  PLT 139* 112* 90* 86*   Basic Metabolic Panel: Recent Labs  Lab 03/28/21 2149 03/29/21 1050 03/30/21 0607 03/31/21 0500  NA 129* 127* 128* 125*  K 3.7 3.3* 4.2 3.4*  CL 88* 88* 87* 88*  CO2 _0 GLUCOSE 257* 447* 311* 358*  BUN 98* 102* 66* 94*  CREATININE 4.78* 4.89* 4.51* 4.98*  CALCIUM 8.9 8.3* 8.5* 8.5*   GFR: Estimated Creatinine Clearance: 19.5 mL/min (A) (by C-G formula based on SCr of 4.98 mg/dL (H)). Liver Function Tests: Recent Labs  Lab 03/28/21 2149 03/30/21 0607 03/31/21 0500  AST _1 ALT _2 ALKPHOS 71 63 63  BILITOT 0.6 0.7 0.7  PROT 9.0* 7.7 7.2  ALBUMIN 3.7 3.3* 2.9*   No results for input(s): LIPASE, AMYLASE in the last 168 hours. No results for input(s): AMMONIA in the last 168 hours. Coagulation Profile: Recent Labs  Lab 03/28/21 2149  INR 1.2   Cardiac Enzymes: No results for input(s): CKTOTAL, CKMB, CKMBINDEX, TROPONINI in the last 168 hours. BNP (last 3 results) No results for input(s): PROBNP in the last 8760  hours. HbA1C: Recent Labs    03/29/21 0157  HGBA1C 8.2*   CBG: Recent Labs  Lab 03/30/21 0751 03/30/21 1150 03/30/21 1712 03/30/21 2202 03/31/21 0803  GLUCAP 298* 340* 381* 320* 344*   Lipid Profile: No results for input(s): CHOL, HDL, LDLCALC, TRIG, CHOLHDL, LDLDIRECT in the last 72 hours. Thyroid Function Tests: No results for input(s): TSH, T4TOTAL, FREET4, T3FREE, THYROIDAB in the last 72 hours. Anemia Panel: No results for input(s): VITAMINB12, FOLATE, FERRITIN, TIBC, IRON, RETICCTPCT in the last 72 hours. Sepsis Labs: Recent Labs  Lab 03/28/21 2358  LATICACIDVEN 1.3    Recent Results (from the past 240 hour(s))  Urine culture     Status: None   Collection Time: 03/28/21  9:49 PM   Specimen: In/Out Cath Urine  Result Value  Ref Range Status   Specimen Description IN/OUT CATH URINE  Final   Special Requests NONE  Final   Culture   Final    NO GROWTH Performed at [facility], [address], [place], [place] [address]    Report Status 03/30/2021 FINAL  Final  Resp Panel by RT-PCR (Flu A&B, Covid) Nasopharyngeal Swab     Status: None   Collection Time: 03/28/21 10:35 PM   Specimen: Nasopharyngeal Swab; Nasopharyngeal(NP) swabs in vial transport medium  Result Value Ref Range Status   SARS Coronavirus 2 by RT PCR NEGATIVE NEGATIVE Final    Comment: (NOTE) SARS-CoV-2 target nucleic acids are NOT DETECTED.  The SARS-CoV-2 RNA is generally detectable in upper respiratory specimens during the acute phase of infection. The lowest concentration of SARS-CoV-2 viral copies this assay can detect is 138 copies/mL. A negative result does not preclude SARS-Cov-2 infection and should not be used as the sole basis for treatment or other patient management decisions. A negative result may occur with  improper specimen collection/handling, submission of specimen other than nasopharyngeal swab, presence of viral mutation(s) within the areas targeted by this assay, and  inadequate number of viral copies(<138 copies/mL). A negative result must be combined with clinical observations, patient history, and epidemiological information. The expected result is Negative.  Fact Sheet for Patients:  EntrepreneurPulse.com.au  Fact Sheet for Healthcare Providers:  IncredibleEmployment.be  This test is no t yet approved or cleared by the [place] FDA and  has been authorized for detection and/or diagnosis of SARS-CoV-2 by FDA under an Emergency Use Authorization (EUA). This EUA will remain  in effect (meaning this test can be used) for the duration of the COVID-19 declaration under Section 564(b)(1) of the Act, 21 U.S.C.section 360bbb-3(b)(1), unless the authorization is terminated  or revoked sooner.       Influenza A by PCR NEGATIVE NEGATIVE Final   Influenza B by PCR NEGATIVE NEGATIVE Final    Comment: (NOTE) The Xpert Xpress SARS-CoV-2/FLU/RSV plus assay is intended as an aid in the diagnosis of influenza from Nasopharyngeal swab specimens and should not be used as a sole basis for treatment. Nasal washings and aspirates are unacceptable for Xpert Xpress SARS-CoV-2/FLU/RSV testing.  Fact Sheet for Patients: EntrepreneurPulse.com.au  Fact Sheet for Healthcare Providers: IncredibleEmployment.be  This test is not yet approved or cleared by the [place] FDA and has been authorized for detection and/or diagnosis of SARS-CoV-2 by FDA under an Emergency Use Authorization (EUA). This EUA will remain in effect (meaning this test can be used) for the duration of the COVID-19 declaration under Section 564(b)(1) of the Act, 21 U.S.C. section 360bbb-3(b)(1), unless the authorization is terminated or revoked.  Performed at [facility], [place] [address] [place], [place] [address]   Blood Culture (routine x 2)     Status: Abnormal   Collection Time: 03/28/21 11:30 PM    Specimen: BLOOD LEFT FOREARM  Result Value Ref Range Status   Specimen Description BLOOD LEFT FOREARM  Final   Special Requests   Final    BOTTLES DRAWN AEROBIC AND ANAEROBIC Blood Culture results may not be optimal due to an inadequate volume of blood received in culture bottles   Culture  Setup Time   Final    GRAM POSITIVE COCCI IN CLUSTERS IN BOTH AEROBIC AND ANAEROBIC BOTTLES CRITICAL RESULT CALLED TO, READ BACK BY AND VERIFIED WITH: [place] [id number] FCP Performed at [facility], [place] [address]  [place], [place] [address]    Culture METHICILLIN RESISTANT STAPHYLOCOCCUS AUREUS (A)  Final   Report Status 03/31/2021 FINAL  Final   Organism ID, Bacteria METHICILLIN RESISTANT STAPHYLOCOCCUS AUREUS  Final      Susceptibility   Methicillin resistant staphylococcus aureus - MIC*    CIPROFLOXACIN >=8 RESISTANT Resistant     ERYTHROMYCIN >=8 RESISTANT Resistant     GENTAMICIN <=0.5 SENSITIVE Sensitive     OXACILLIN >=4 RESISTANT Resistant     TETRACYCLINE <=1 SENSITIVE Sensitive     VANCOMYCIN <=0.5 SENSITIVE Sensitive     TRIMETH/SULFA <=10 SENSITIVE Sensitive     CLINDAMYCIN <=0.25 SENSITIVE Sensitive     RIFAMPIN <=0.5 SENSITIVE Sensitive     Inducible Clindamycin NEGATIVE Sensitive     * METHICILLIN RESISTANT STAPHYLOCOCCUS AUREUS  Blood Culture ID Panel (Reflexed)     Status: Abnormal   Collection Time: 03/28/21 11:30 PM  Result Value Ref Range Status   Enterococcus faecalis NOT DETECTED NOT DETECTED Final   Enterococcus Faecium NOT DETECTED NOT DETECTED Final   Listeria monocytogenes NOT DETECTED NOT DETECTED Final   Staphylococcus species DETECTED (A) NOT DETECTED Final    Comment: CRITICAL RESULT CALLED TO, READ BACK BY AND VERIFIED WITH: PHARMD HALEY V. [phone] FCP    Staphylococcus aureus (BCID) DETECTED (A) NOT DETECTED Final    Comment: Methicillin (oxacillin)-resistant Staphylococcus aureus (MRSA). MRSA is predictably resistant to beta-lactam  antibiotics (except ceftaroline). Preferred therapy is vancomycin unless clinically contraindicated. Patient requires contact precautions if  hospitalized. CRITICAL RESULT CALLED TO, READ BACK BY AND VERIFIED WITH: PHARMD HALEY V. [phone] FCP    Staphylococcus epidermidis NOT DETECTED NOT DETECTED Final   Staphylococcus lugdunensis NOT DETECTED NOT DETECTED Final   Streptococcus species NOT DETECTED NOT DETECTED Final   Streptococcus agalactiae NOT DETECTED NOT DETECTED Final   Streptococcus pneumoniae NOT DETECTED NOT DETECTED Final   Streptococcus pyogenes NOT DETECTED NOT DETECTED Final   A.calcoaceticus-baumannii NOT DETECTED NOT DETECTED Final   Bacteroides fragilis NOT DETECTED NOT DETECTED Final   Enterobacterales NOT DETECTED NOT DETECTED Final   Enterobacter cloacae complex NOT DETECTED NOT DETECTED Final   Escherichia coli NOT DETECTED NOT DETECTED Final   Klebsiella aerogenes NOT DETECTED NOT DETECTED Final   Klebsiella oxytoca NOT DETECTED NOT DETECTED Final   Klebsiella pneumoniae NOT DETECTED NOT DETECTED Final   Proteus species NOT DETECTED NOT DETECTED Final   Salmonella species NOT DETECTED NOT DETECTED Final   Serratia marcescens NOT DETECTED NOT DETECTED Final   Haemophilus influenzae NOT DETECTED NOT DETECTED Final   Neisseria meningitidis NOT DETECTED NOT DETECTED Final   Pseudomonas aeruginosa NOT DETECTED NOT DETECTED Final   Stenotrophomonas maltophilia NOT DETECTED NOT DETECTED Final   Candida albicans NOT DETECTED NOT DETECTED Final   Candida auris NOT DETECTED NOT DETECTED Final   Candida glabrata NOT DETECTED NOT DETECTED Final   Candida krusei NOT DETECTED NOT DETECTED Final   Candida parapsilosis NOT DETECTED NOT DETECTED Final   Candida tropicalis NOT DETECTED NOT DETECTED Final   Cryptococcus neoformans/gattii NOT DETECTED NOT DETECTED Final   Meth resistant mecA/C and MREJ DETECTED (A) NOT DETECTED Final    Comment: CRITICAL RESULT CALLED TO,  READ BACK BY AND VERIFIED WITH: Jonn Shingles [id number] FCP Performed at [facility], [address], [place], [place] [address]   Body fluid culture w Gram Stain     Status: None (Preliminary result)   Collection Time: 03/29/21 12:10 AM   Specimen:  Body Fluid  Result Value Ref Range Status   Specimen Description FLUID LEFT KNEE  Final   Special Requests SYRINGE  Final   Gram Stain FEW WBC SEEN NO ORGANISMS SEEN   Final   Culture   Final    NO GROWTH 2 DAYS Performed at [facility], [address] [place], [place] [address]    Report Status PENDING  Incomplete  Blood Culture (routine x 2)     Status: None (Preliminary result)   Collection Time: 03/29/21  3:00 AM   Specimen: BLOOD  Result Value Ref Range Status   Specimen Description BLOOD SITE NOT SPECIFIED  Final   Special Requests   Final    BOTTLES DRAWN AEROBIC AND ANAEROBIC Blood Culture adequate volume   Culture   Final    NO GROWTH 2 DAYS Performed at [facility], [address] [place], [place] [address]    Report Status PENDING  Incomplete         Radiology Studies: IR Removal Tun Cv Cath W/O FL  Result Date: 03/30/2021 INDICATION: Bacteremia.  Request for tunneled hemodialysis catheter removal. EXAM: REMOVAL OF TUNNELED HEMODIALYSIS CATHETER MEDICATIONS: 10 mL 1% lidocaine COMPLICATIONS: None immediate. PROCEDURE: Informed written consent was obtained from the patient following an explanation of the procedure, risks, benefits and alternatives to treatment. A time out was performed prior to the initiation of the procedure. Maximal barrier sterile technique was utilized including caps, mask, sterile gowns, sterile gloves, large sterile drape, and hand hygiene. ChloraPrep was used to prep the patient's right neck, chest and existing catheter. 1% lidocaine with epinephrine was injected around the catheter and the subcutaneous tunnel. The catheter was dissected out using scissors and curved  hemostats until the cuff was freed from the surrounding fibrous sheath. Partial cuff retained, catheter was removed intact otherwise. Hemostasis was obtained with manual compression. A dressing was placed. The patient tolerated the procedure well without immediate post procedural complication. IMPRESSION: Successful removal of tunneled dialysis catheter. Read by: Durenda Guthrie, PA-C Electronically Signed   By: Jacqulynn Cadet M.D.   On: 03/30/2021 11:06        Scheduled Meds: . amLODipine  5 mg Oral QHS  . aspirin  81 mg Oral Daily  . atorvastatin  40 mg Oral QHS  . calcium acetate  667 mg Oral TID with meals  . carvedilol  12.5 mg Oral BID WC  . Chlorhexidine Gluconate Cloth  6 each Topical Q0600  . heparin  5,000 Units Subcutaneous Q8H  . insulin aspart  0-15 Units Subcutaneous TID WC  . insulin aspart  0-5 Units Subcutaneous QHS  . insulin aspart  5 Units Subcutaneous TID WC  . insulin glargine  30 Units Subcutaneous BID  . pantoprazole  20 mg Oral Daily  . sertraline  100 mg Oral Daily   Continuous Infusions: . vancomycin 1,000 mg (03/31/21 1152)     LOS: 2 days    Time spent: 35 minutes.     Elmarie Shiley, MD [facility]   If 7PM-7AM, please contact night-coverage [email or web address]  03/31/2021, 1:26 PM

## 2021-04-01 ENCOUNTER — Inpatient Hospital Stay (HOSPITAL_COMMUNITY): Payer: Medicare Other

## 2021-04-01 DIAGNOSIS — R7881 Bacteremia: Secondary | ICD-10-CM | POA: Diagnosis not present

## 2021-04-01 DIAGNOSIS — B9562 Methicillin resistant Staphylococcus aureus infection as the cause of diseases classified elsewhere: Secondary | ICD-10-CM | POA: Diagnosis not present

## 2021-04-01 DIAGNOSIS — N186 End stage renal disease: Secondary | ICD-10-CM | POA: Diagnosis not present

## 2021-04-01 DIAGNOSIS — T8454XD Infection and inflammatory reaction due to internal left knee prosthesis, subsequent encounter: Secondary | ICD-10-CM | POA: Diagnosis not present

## 2021-04-01 LAB — ECHOCARDIOGRAM COMPLETE
AR max vel: 2.43 cm2
AV Area VTI: 2.63 cm2
AV Area mean vel: 2.62 cm2
AV Mean grad: 5 mmHg
AV Peak grad: 10.4 mmHg
Ao pk vel: 1.61 m/s
Area-P 1/2: 4.21 cm2
Height: 65 in
MV VTI: 2.09 cm2
S' Lateral: 3.2 cm
Weight: 4338.65 oz

## 2021-04-01 LAB — GLUCOSE, CAPILLARY
Glucose-Capillary: 267 mg/dL — ABNORMAL HIGH (ref 70–99)
Glucose-Capillary: 150 mg/dL — ABNORMAL HIGH (ref 70–99)
Glucose-Capillary: 76 mg/dL (ref 70–99)
Glucose-Capillary: 95 mg/dL (ref 70–99)

## 2021-04-01 LAB — BODY FLUID CULTURE W GRAM STAIN: Culture: NO GROWTH

## 2021-04-01 MED ORDER — TRAZODONE HCL 50 MG PO TABS: 150.0000 mg | ORAL_TABLET | Freq: Every evening | ORAL | Status: DC | PRN

## 2021-04-01 MED ORDER — SODIUM CHLORIDE 0.9 % IV SOLN: INTRAVENOUS | Status: DC

## 2021-04-01 MED ORDER — HYDROMORPHONE HCL 1 MG/ML IJ SOLN
1.0000 mg | INTRAMUSCULAR | Status: DC | PRN
Start: 2021-04-01 — End: 2021-04-02
  Administered 2021-04-01 (×3): 1 mg via INTRAVENOUS
  Filled 2021-04-01 (×3): qty 1

## 2021-04-01 MED ORDER — OXYCODONE HCL 5 MG PO TABS
5.0000 mg | ORAL_TABLET | Freq: Four times a day (QID) | ORAL | Status: DC | PRN
Start: 2021-04-01 — End: 2021-04-12
  Administered 2021-04-02 – 2021-04-11 (×11): 5 mg via ORAL
  Filled 2021-04-01 (×9): qty 1

## 2021-04-01 MED ORDER — PERFLUTREN LIPID MICROSPHERE
1.0000 mL | INTRAVENOUS | Status: AC | PRN
  Administered 2021-04-01: 4 mL via INTRAVENOUS
  Filled 2021-04-01: qty 10

## 2021-04-01 NOTE — Progress Notes (Incomplete)
  Echocardiogram 2D Echocardiogram has been performed.  Cammy Brochure 04/01/2021, 12:59 PM

## 2021-04-01 NOTE — Progress Notes (Signed)
Pharmacy Antibiotic Note  Jeffrey [name] is a 60 y.o. male admitted on 03/28/2021 with sepsis from possible catheter/graft infection +/- knee hardware infection.  Pharmacy has been consulted for vancomycin and cefepime dosing.  Pt is on D4 of vanc for desseminated MRSA bacteremia. Ortho eval for possible AKA for source control. TTE pending for endocarditis eval.  Plan: Continue vanc '1000mg'$  IV MWF.  Goal pre-HD level 15-25 mcg/mL. Level as needed  Height: '5\' 5"'$  (165.1 cm) Weight: 123 kg (271 lb 2.7 oz) IBW/kg (Calculated) : 61.5  Temp (24hrs), Avg:99 F (37.2 C), Min:98.2 F (36.8 C), Max:99.8 F (37.7 C)  Recent Labs  Lab 03/28/21 2149 03/28/21 2358 03/29/21 1050 03/30/21 0607 03/31/21 0500  WBC 12.3*  --  12.6* 10.2 8.5  CREATININE 4.78*  --  4.89* 4.51* 4.98*  LATICACIDVEN  --  1.3  --   --   --     Estimated Creatinine Clearance: 19.2 mL/min (A) (by C-G formula based on SCr of 4.98 mg/dL (H)).    Allergies  Allergen Reactions  . Bupropion Swelling  . Dexmedetomidine Nausea And Vomiting    Other reaction(s): Other (See Comments) Dose-limiting bradycardia Dose-limiting bradycardia   . Ibuprofen Shortness Of Breath and Swelling    Pt tolerates aspirin Pt tolerates aspirin   . Pioglitazone Anaphylaxis and Rash  . Tomato Anaphylaxis   Cefepime 5/2>>5/2 Vancomycin 5/2>>  5/2 Bcx: MRSA 2/4 5/2 Fluid from L knee: ngtd 5/1 Ucx: [place], PharmD, BCIDP, AAHIVP, CPP Infectious Disease Pharmacist 04/01/2021 8:45 AM

## 2021-04-01 NOTE — Progress Notes (Signed)
[name]  Date of Admission:  03/28/2021     Total days of antibiotics 5         ASSESSMENT:  [name] was seen by Orthopedics with plan for above knee amputation on Saturday. Will attempt blood cultures with dialysis tomorrow. TEE scheduled for tomorrow with Dr. Harrington [name] to rule out endocarditis. Continue current dose of vancomycin with dialysis with duration of treatment pending further work up.  PLAN:  1. Continue vancomycin with dialysis.  2. Awaiting surgery on Saturday for above knee amputation.  3. Will attempt blood cultures with dialysis and will obtain tubes for dialysis.   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 amLODipine  5 mg Oral QHS  . aspirin  81 mg Oral Daily  . atorvastatin  40 mg Oral QHS  . calcium acetate  667 mg Oral TID with meals  . carvedilol  12.5 mg Oral BID WC  . Chlorhexidine Gluconate Cloth  6 each Topical Q0600  . heparin  5,000 Units Subcutaneous Q8H  . insulin aspart  0-15 Units Subcutaneous TID WC  . insulin aspart  0-5 Units Subcutaneous QHS  . insulin aspart  5 Units Subcutaneous TID WC  . insulin regular human CONCENTRATED  95 Units Subcutaneous Q breakfast   And  . insulin regular human CONCENTRATED  45 Units Subcutaneous Q supper  . nortriptyline  10 mg Oral QHS  . pantoprazole  20 mg Oral Daily  . sertraline  100 mg Oral Daily    SUBJECTIVE:  Afebrile overnight with no acute events. Denies fevers/chills.   Allergies  Allergen Reactions  . Bupropion Swelling  . Dexmedetomidine  Nausea And Vomiting    Other reaction(s): Other (See Comments) Dose-limiting bradycardia Dose-limiting bradycardia   . Ibuprofen Shortness Of Breath and Swelling    Pt tolerates aspirin Pt tolerates aspirin   . Pioglitazone Anaphylaxis and Rash  . Tomato Anaphylaxis     Review of Systems: Review of Systems  Constitutional: Negative for chills, fever and weight loss.  Respiratory: Negative for cough, shortness of breath and wheezing.   Cardiovascular: Negative for chest pain and leg swelling.  Gastrointestinal: Negative for abdominal pain, constipation, diarrhea, nausea and vomiting.  Musculoskeletal:       Positive for left knee pain.   Skin: Negative for rash.      OBJECTIVE: Vitals:   03/31/21 2000 03/31/21 2349 04/01/21 0442 04/01/21 0820  BP: (!) 115/41 (!) 124/43 (!) 130/33 107/64  Pulse: 89 89 87 86  Resp: '19 15 18 17  '$ Temp: 99.8 F (37.7 C) 99.6 F (37.6 C) 99 F (37.2 C) 98.7 F (37.1 C)  TempSrc: Oral Axillary Axillary Axillary  SpO2: 100% 99% 98% 97%  Weight:      Height:       Body mass index is 45.12 kg/m.  Physical Exam Constitutional:      General: He is not in acute distress.    Appearance: He is well-developed. He is obese.     Comments: Lying in bed with head of bed  elevated; pleasant.   Cardiovascular:     Rate and Rhythm: Normal rate and regular rhythm.     Heart sounds: Normal heart sounds.  Pulmonary:     Effort: Pulmonary effort is normal.     Breath sounds: Normal breath sounds.  Skin:    General: Skin is warm and dry.  Neurological:     Mental Status: He is alert and oriented to person, place, and time.  Psychiatric:        Behavior: Behavior normal.        Thought Content: Thought content normal.        Judgment: Judgment normal.     Lab Results Lab Results  Component Value Date   WBC 8.5 03/31/2021   HGB 8.4 (L) 03/31/2021   HCT 25.6 (L) 03/31/2021   MCV 92.1 03/31/2021   PLT 86 (L) 03/31/2021    Lab Results   Component Value Date   CREATININE 4.98 (H) 03/31/2021   BUN 94 (H) 03/31/2021   NA 125 (L) 03/31/2021   K 3.4 (L) 03/31/2021   CL 88 (L) 03/31/2021   CO2 24 03/31/2021    Lab Results  Component Value Date   ALT 19 03/31/2021   AST 19 03/31/2021   ALKPHOS 63 03/31/2021   BILITOT 0.7 03/31/2021     Microbiology: Recent Results (from the past 240 hour(s))  Urine culture     Status: None   Collection Time: 03/28/21  9:49 PM   Specimen: In/Out Cath Urine  Result Value Ref Range Status   Specimen Description IN/OUT CATH URINE  Final   Special Requests NONE  Final   Culture   Final    NO GROWTH Performed at [facility], [address], [place], [name] [address]    Report Status 03/30/2021 FINAL  Final  Resp Panel by RT-PCR (Flu A&B, Covid) Nasopharyngeal Swab     Status: None   Collection Time: 03/28/21 10:35 PM   Specimen: Nasopharyngeal Swab; Nasopharyngeal(NP) swabs in vial transport medium  Result Value Ref Range Status   SARS Coronavirus 2 by RT PCR NEGATIVE NEGATIVE Final    Comment: (NOTE) SARS-CoV-2 target nucleic acids are NOT DETECTED.  The SARS-CoV-2 RNA is generally detectable in upper respiratory specimens during the acute phase of infection. The lowest concentration of SARS-CoV-2 viral copies this assay can detect is 138 copies/mL. A negative result does not preclude SARS-Cov-2 infection and should not be used as the sole basis for treatment or other patient management decisions. A negative result may occur with  improper specimen collection/handling, submission of specimen other than nasopharyngeal swab, presence of viral mutation(s) within the areas targeted by this assay, and inadequate number of viral copies(<138 copies/mL). A negative result must be combined with clinical observations, patient history, and epidemiological information. The expected result is Negative.  Fact Sheet for Patients:  EntrepreneurPulse.com.au  Fact  Sheet for Healthcare Providers:  IncredibleEmployment.be  This test is no t yet approved or cleared by the [place] FDA and  has been authorized for detection and/or diagnosis of SARS-CoV-2 by FDA under an Emergency Use Authorization (EUA). This EUA will remain  in effect (meaning this test can be used) for the duration of the COVID-19 declaration under Section 564(b)(1) of the Act, 21 U.S.C.section 360bbb-3(b)(1), unless the authorization is terminated  or revoked sooner.       Influenza A by PCR NEGATIVE NEGATIVE Final   Influenza B by PCR NEGATIVE NEGATIVE Final    Comment: (NOTE)  The Xpert Xpress SARS-CoV-2/FLU/RSV plus assay is intended as an aid in the diagnosis of influenza from Nasopharyngeal swab specimens and should not be used as a sole basis for treatment. Nasal washings and aspirates are unacceptable for Xpert Xpress SARS-CoV-2/FLU/RSV testing.  Fact Sheet for Patients: EntrepreneurPulse.com.au  Fact Sheet for Healthcare Providers: IncredibleEmployment.be  This test is not yet approved or cleared by the [place] FDA and has been authorized for detection and/or diagnosis of SARS-CoV-2 by FDA under an Emergency Use Authorization (EUA). This EUA will remain in effect (meaning this test can be used) for the duration of the COVID-19 declaration under Section 564(b)(1) of the Act, 21 U.S.C. section 360bbb-3(b)(1), unless the authorization is terminated or revoked.  Performed at [facility], [place] [address] [place], [place] [address]   Blood Culture (routine x 2)     Status: Abnormal   Collection Time: 03/28/21 11:30 PM   Specimen: BLOOD LEFT FOREARM  Result Value Ref Range Status   Specimen Description BLOOD LEFT FOREARM  Final   Special Requests   Final    BOTTLES DRAWN AEROBIC AND ANAEROBIC Blood Culture results may not be optimal due to an inadequate volume of blood received in culture bottles    Culture  Setup Time   Final    GRAM POSITIVE COCCI IN CLUSTERS IN BOTH AEROBIC AND ANAEROBIC BOTTLES CRITICAL RESULT CALLED TO, READ BACK BY AND VERIFIED WITH: [place] [id number] FCP Performed at [facility], [place] [address], [place],  [address]    Culture METHICILLIN RESISTANT STAPHYLOCOCCUS AUREUS (A)  Final   Report Status 03/31/2021 FINAL  Final   Organism ID, Bacteria METHICILLIN RESISTANT STAPHYLOCOCCUS AUREUS  Final      Susceptibility   Methicillin resistant staphylococcus aureus - MIC*    CIPROFLOXACIN >=8 RESISTANT Resistant     ERYTHROMYCIN >=8 RESISTANT Resistant     GENTAMICIN <=0.5 SENSITIVE Sensitive     OXACILLIN >=4 RESISTANT Resistant     TETRACYCLINE <=1 SENSITIVE Sensitive     VANCOMYCIN <=0.5 SENSITIVE Sensitive     TRIMETH/SULFA <=10 SENSITIVE Sensitive     CLINDAMYCIN <=0.25 SENSITIVE Sensitive     RIFAMPIN <=0.5 SENSITIVE Sensitive     Inducible Clindamycin NEGATIVE Sensitive     * METHICILLIN RESISTANT STAPHYLOCOCCUS AUREUS  Blood Culture ID Panel (Reflexed)     Status: Abnormal   Collection Time: 03/28/21 11:30 PM  Result Value Ref Range Status   Enterococcus faecalis NOT DETECTED NOT DETECTED Final   Enterococcus Faecium NOT DETECTED NOT DETECTED Final   Listeria monocytogenes NOT DETECTED NOT DETECTED Final   Staphylococcus species DETECTED (A) NOT DETECTED Final    Comment: CRITICAL RESULT CALLED TO, READ BACK BY AND VERIFIED WITH: PHARMD HALEY V. [id number] FCP    Staphylococcus aureus (BCID) DETECTED (A) NOT DETECTED Final    Comment: Methicillin (oxacillin)-resistant Staphylococcus aureus (MRSA). MRSA is predictably resistant to beta-lactam antibiotics (except ceftaroline). Preferred therapy is vancomycin unless clinically contraindicated. Patient requires contact precautions if  hospitalized. CRITICAL RESULT CALLED TO, READ BACK BY AND VERIFIED WITH: PHARMD HALEY V. [id number] FCP    Staphylococcus epidermidis NOT  DETECTED NOT DETECTED Final   Staphylococcus lugdunensis NOT DETECTED NOT DETECTED Final   Streptococcus species NOT DETECTED NOT DETECTED Final   Streptococcus agalactiae NOT DETECTED NOT DETECTED Final   Streptococcus pneumoniae NOT DETECTED NOT DETECTED Final   Streptococcus pyogenes NOT DETECTED NOT DETECTED Final   A.calcoaceticus-baumannii NOT DETECTED NOT DETECTED Final  Bacteroides fragilis NOT DETECTED NOT DETECTED Final   Enterobacterales NOT DETECTED NOT DETECTED Final   Enterobacter cloacae complex NOT DETECTED NOT DETECTED Final   Escherichia coli NOT DETECTED NOT DETECTED Final   Klebsiella aerogenes NOT DETECTED NOT DETECTED Final   Klebsiella oxytoca NOT DETECTED NOT DETECTED Final   Klebsiella pneumoniae NOT DETECTED NOT DETECTED Final   Proteus species NOT DETECTED NOT DETECTED Final   Salmonella species NOT DETECTED NOT DETECTED Final   Serratia marcescens NOT DETECTED NOT DETECTED Final   Haemophilus influenzae NOT DETECTED NOT DETECTED Final   Neisseria meningitidis NOT DETECTED NOT DETECTED Final   Pseudomonas aeruginosa NOT DETECTED NOT DETECTED Final   Stenotrophomonas maltophilia NOT DETECTED NOT DETECTED Final   Candida albicans NOT DETECTED NOT DETECTED Final   Candida auris NOT DETECTED NOT DETECTED Final   Candida glabrata NOT DETECTED NOT DETECTED Final   Candida krusei NOT DETECTED NOT DETECTED Final   Candida parapsilosis NOT DETECTED NOT DETECTED Final   Candida tropicalis NOT DETECTED NOT DETECTED Final   Cryptococcus neoformans/gattii NOT DETECTED NOT DETECTED Final   Meth resistant mecA/C and MREJ DETECTED (A) NOT DETECTED Final    Comment: CRITICAL RESULT CALLED TO, READ BACK BY AND VERIFIED WITH: Jonn Shingles [id number] FCP Performed at [facility], [address] [place], [place] [address]   Body fluid culture w Gram Stain     Status: None   Collection Time: 03/29/21 12:10 AM   Specimen: Body Fluid  Result Value Ref Range  Status   Specimen Description FLUID LEFT KNEE  Final   Special Requests SYRINGE  Final   Gram Stain FEW WBC SEEN NO ORGANISMS SEEN   Final   Culture   Final    NO GROWTH 3 DAYS Performed at [facility], [address], [place], [place] [address]    Report Status 04/01/2021 FINAL  Final  Blood Culture (routine x 2)     Status: None (Preliminary result)   Collection Time: 03/29/21  3:00 AM   Specimen: BLOOD  Result Value Ref Range Status   Specimen Description BLOOD SITE NOT SPECIFIED  Final   Special Requests   Final    BOTTLES DRAWN AEROBIC AND ANAEROBIC Blood Culture adequate volume   Culture   Final    NO GROWTH 3 DAYS Performed at [facility], [place] [address] [place], [place] [address]    Report Status PENDING  Incomplete     Terri Piedra, [facility]  04/01/2021  11:35 AM

## 2021-04-01 NOTE — [place] Notes (Signed)
PROGRESS NOTE    Jeffrey [name]  NAT:[id number] DOB: 08-15-1961 DOA: 03/28/2021 PCP: Pcp, No   Brief Narrative: 60 year old with past medical history significant for ESRD,  diabetes presents to the ED complaining of fever.  Accompanied by vomiting, diaphoresis and mild confusion.  He has been having left knee pain.  Patient has a history of chronic knee  pain and swelling left knee, prior imagine at [facility].  Patient has a history of knee replacement/hardware bilaterally.  In the ED left knee imaging showed massive knee joint effusion.  Arthrocentesis was performed however only 1 cc of gelatinous red fluid was obtained by ED provider.  Patient was started empirically on IV antibiotics.  Patient was evaluated by orthopedic, who is recommending AKA>  Patient also diagnosed with MRSA bacteremia, underwent hemodialysis catheter removal.  Currently has RUE AVG (01/2021).  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 -Need repeat blood cultures to document clearance, cultures ordered by ID. ID will provide blood culture kit to HD units for blood culture to be drawn.  -2D echo pending, ID recommend TEE. Cardiology aware.    2-Left prosthetic knee septic arthritis:  Arthrocentesis performed by ED only 1 cc gelatinous red fluid obtained.  Culture from arthrocentesis: no Growth to date.  CT; Irregular lucency around the tibial processes which may reflect hardware loosening or  infection.  Not a specific moderate sized knee joint effusion. -Dr Lucia Gaskins planning surgery on Saturday.   3-Diabetes type 2: Transition to Humulin R 500--95 units in am and 45 units PM.  Continue with  sliding scale insulin.  4-Severe Sepsis: Secondary to septic arthritis, patient presents with Fever, tachycardia, tachypnea.  Found to have MRSA Bacteremia. Undergoing work up.  Treated with IV antibiotics, fluids.   5-ESRD on HD;  HD catheter removed.  Appreciate nephrology assistance.   6-Hypersomnolence: Resolved.  Gabapentin, Nortriptyline trazodone, valium on hold.  -Resume Nortriptyline and valium lower dose PRN 5/04 -Resume trazadone 5/05.   7-Thrombocytopenia; secondary to infectious process. Monitor  8-HTN; Continue with coreg, amlodipine.  Hyponatremia; correction with HD>      Estimated body mass index is 45.12 kg/m as calculated from the following:   Height as of this encounter: '5\' 5"'  (1.651 m).   Weight as of this encounter: 123 kg.   DVT prophylaxis: Heparin  Code Status: Full code Family Communication: Care discussed with patient and wife who was at bedside. 5/04 Disposition Plan:  Status is: Inpatient  Remains inpatient appropriate because:IV treatments appropriate due to intensity of illness or inability to take PO   Dispo: The patient is from: Home              Anticipated d/c is to: to be determine              Patient currently is not medically stable to d/c.   Difficult to place patient No        Consultants:   ID  Nephrology  Orthopoedic  Procedures:   HD  Antimicrobials:  Vancomycin 03/29/2021  Subjective: He was stress last night, that is why his Blood sugar was elevated.  He continue to have knee pain. He has accepted sx.   Objective: Vitals:   03/31/21 2000 03/31/21 2349 04/01/21 0442 04/01/21 0820  BP: (!) 115/41 (!) 124/43 (!) 130/33 107/64  Pulse: 89 89 87 86  Resp: '19 15 18 17  ' Temp: 99.8 F (37.7 C) 99.6 F  (37.6 C) 99 F (37.2 C) 98.7 F (37.1 C)  TempSrc: Oral Axillary Axillary Axillary  SpO2: 100% 99% 98% 97%  Weight:      Height:        Intake/Output Summary (Last 24 hours) at 04/01/2021 0951 Last data filed at 03/31/2021 2000 Gross per 24 hour  Intake --  Output 2850 ml  Net -2850 ml   Filed Weights   03/29/21 1730 03/31/21 0845 03/31/21 1250  Weight: 125.7 kg 125.8 kg 123 kg    Examination:  General exam: NAD Respiratory system: CTA Cardiovascular system: S 1, S 2 RRR Gastrointestinal system: BS present, soft, nt Central nervous system: alert Extremities: Symmetric 5 x 5 power. Left knee with edema.     Data Reviewed: I have personally reviewed following labs and imaging studies  CBC: Recent Labs  Lab 03/28/21 2149 03/29/21 1050 03/30/21 0607 03/31/21 0500  WBC 12.3* 12.6* 10.2 8.5  NEUTROABS 10.8*  --   --   --   HGB 10.5* 9.8* 10.4* 8.4*  HCT 32.5* 30.5* 32.5* 25.6*  MCV 91.0 92.4 92.6 92.1  PLT 139* 112* 90* 86*   Basic Metabolic Panel: Recent Labs  Lab 03/28/21 2149 03/29/21 1050 03/30/21 0607 03/31/21 0500  NA 129* 127* 128* 125*  K 3.7 3.3* 4.2 3.4*  CL 88* 88* 87* 88*  CO2 '26 23 25 24  ' GLUCOSE 257* 447* 311* 358*  BUN 98* 102* 66* 94*  CREATININE 4.78* 4.89* 4.51* 4.98*  CALCIUM 8.9 8.3* 8.5* 8.5*  PHOS  --   --   --  6.8*   GFR: Estimated Creatinine Clearance: 19.2 mL/min (A) (by C-G formula based on SCr of 4.98 mg/dL (H)). Liver Function Tests: Recent Labs  Lab 03/28/21 2149 03/30/21 0607 03/31/21 0500  AST '25 31 19  ' ALT '14 21 19  ' ALKPHOS 71 63 63  BILITOT 0.6 0.7 0.7  PROT 9.0* 7.7 7.2  ALBUMIN 3.7 3.3* 2.9*   No results for input(s): LIPASE, AMYLASE in the last 168 hours. No results for input(s): AMMONIA in the last 168 hours. Coagulation Profile: Recent Labs  Lab 03/28/21 2149  INR 1.2   Cardiac Enzymes: No results for input(s): CKTOTAL, CKMB, CKMBINDEX, TROPONINI in the last 168 hours. BNP (last 3 results) No  results for input(s): PROBNP in the last 8760 hours. HbA1C: No results for input(s): HGBA1C in the last 72 hours. CBG: Recent Labs  Lab 03/31/21 0803 03/31/21 1340 03/31/21 1641 03/31/21 2159 04/01/21 0822  GLUCAP 344* 180* 317* 266* 267*   Lipid Profile: No results for input(s): CHOL, HDL, LDLCALC, TRIG, CHOLHDL, LDLDIRECT in the last 72 hours. Thyroid Function Tests: No results for input(s): TSH, T4TOTAL, FREET4, T3FREE, THYROIDAB in the last 72 hours. Anemia Panel: No results for input(s): VITAMINB12, FOLATE, FERRITIN, TIBC, IRON, RETICCTPCT in the last 72 hours. Sepsis Labs: Recent Labs  Lab 03/28/21 2358  LATICACIDVEN 1.3    Recent Results (from the past 240 hour(s))  Urine culture     Status: None   Collection Time: 03/28/21  9:49 PM   Specimen: In/Out Cath Urine  Result Value Ref Range Status   Specimen Description IN/OUT CATH URINE  Final   Special Requests NONE  Final   Culture   Final    NO GROWTH Performed at [facility], [address], [place], [place] [address]    Report Status 03/30/2021 FINAL  Final  Resp Panel by RT-PCR (Flu A&B, Covid) Nasopharyngeal Swab     Status: None   Collection Time: 03/28/21 10:35 PM   Specimen: Nasopharyngeal Swab; Nasopharyngeal(NP) swabs in vial transport medium  Result Value Ref Range Status   SARS Coronavirus 2 by RT PCR NEGATIVE NEGATIVE Final    Comment: (NOTE) SARS-CoV-2 target nucleic acids are NOT DETECTED.  The SARS-CoV-2 RNA is generally detectable in upper respiratory specimens during the acute phase of infection. The lowest concentration of SARS-CoV-2 viral copies this assay can detect is 138 copies/mL. A negative result does not preclude SARS-Cov-2 infection and should not be used as the sole basis for treatment or other patient management decisions. A negative result may occur with  improper specimen collection/handling, submission of specimen other than nasopharyngeal swab, presence of viral  mutation(s) within the areas targeted by this assay, and inadequate number of viral copies(<138 copies/mL). A negative result must be combined with clinical observations, patient history, and epidemiological information. The expected result is Negative.  Fact Sheet for Patients:  EntrepreneurPulse.com.au  Fact Sheet for Healthcare Providers:  IncredibleEmployment.be  This test is no t yet approved or cleared by the [place] FDA and  has been authorized for detection and/or diagnosis of SARS-CoV-2 by FDA under an Emergency Use Authorization (EUA). This EUA will remain  in effect (meaning this test can be used) for the duration of the COVID-19 declaration under Section 564(b)(1) of the Act, 21 U.S.C.section 360bbb-3(b)(1), unless the authorization is terminated  or revoked sooner.       Influenza A by PCR NEGATIVE NEGATIVE Final   Influenza B by PCR NEGATIVE NEGATIVE Final    Comment: (NOTE) The Xpert Xpress SARS-CoV-2/FLU/RSV plus assay is intended as an aid in the diagnosis of influenza from Nasopharyngeal swab specimens and should not be used as a sole basis for treatment. Nasal washings and aspirates are unacceptable for Xpert Xpress SARS-CoV-2/FLU/RSV testing.  Fact Sheet for Patients: EntrepreneurPulse.com.au  Fact Sheet for Healthcare Providers: IncredibleEmployment.be  This test is not yet approved or cleared by the [place] FDA and has been authorized for detection and/or diagnosis of SARS-CoV-2 by FDA under an Emergency Use Authorization (EUA). This EUA will remain in effect (meaning this test can be used) for the duration of the COVID-19 declaration under Section 564(b)(1) of the Act, 21 U.S.C. section 360bbb-3(b)(1), unless the authorization is terminated or revoked.  Performed at [facility], [place] [address], [place], [place] [address]   Blood Culture (routine x 2)      Status: Abnormal   Collection Time: 03/28/21 11:30 PM   Specimen: BLOOD LEFT FOREARM  Result Value Ref Range Status   Specimen Description BLOOD LEFT FOREARM  Final   Special Requests   Final    BOTTLES DRAWN AEROBIC AND ANAEROBIC Blood Culture results may not be optimal due to an inadequate volume of blood received in culture bottles   Culture  Setup Time   Final    GRAM POSITIVE COCCI IN CLUSTERS IN BOTH AEROBIC AND ANAEROBIC BOTTLES CRITICAL RESULT CALLED TO, READ BACK BY AND VERIFIED WITH: PHARMD HALEY V. [phone]  FCP Performed at [facility], [place] [address], [place], [place] [address]    Culture METHICILLIN RESISTANT STAPHYLOCOCCUS AUREUS (A)  Final   Report Status 03/31/2021 FINAL  Final   Organism ID, Bacteria METHICILLIN RESISTANT STAPHYLOCOCCUS AUREUS  Final      Susceptibility   Methicillin resistant staphylococcus aureus - MIC*    CIPROFLOXACIN >=8 RESISTANT Resistant     ERYTHROMYCIN >=8 RESISTANT Resistant     GENTAMICIN <=0.5 SENSITIVE Sensitive     OXACILLIN >=4 RESISTANT Resistant     TETRACYCLINE <=1 SENSITIVE Sensitive     VANCOMYCIN <=0.5 SENSITIVE Sensitive     TRIMETH/SULFA <=10 SENSITIVE Sensitive     CLINDAMYCIN <=0.25 SENSITIVE Sensitive     RIFAMPIN <=0.5 SENSITIVE Sensitive     Inducible Clindamycin NEGATIVE Sensitive     * METHICILLIN RESISTANT STAPHYLOCOCCUS AUREUS  Blood Culture ID Panel (Reflexed)     Status: Abnormal   Collection Time: 03/28/21 11:30 PM  Result Value Ref Range Status   Enterococcus faecalis NOT DETECTED NOT DETECTED Final   Enterococcus Faecium NOT DETECTED NOT DETECTED Final   Listeria monocytogenes NOT DETECTED NOT DETECTED Final   Staphylococcus species DETECTED (A) NOT DETECTED Final    Comment: CRITICAL RESULT CALLED TO, READ BACK BY AND VERIFIED WITH: PHARMD HALEY V. [phone] FCP    Staphylococcus aureus (BCID) DETECTED (A) NOT DETECTED Final    Comment: Methicillin (oxacillin)-resistant Staphylococcus  aureus (MRSA). MRSA is predictably resistant to beta-lactam antibiotics (except ceftaroline). Preferred therapy is vancomycin unless clinically contraindicated. Patient requires contact precautions if  hospitalized. CRITICAL RESULT CALLED TO, READ BACK BY AND VERIFIED WITH: PHARMD HALEY V. [phone] FCP    Staphylococcus epidermidis NOT DETECTED NOT DETECTED Final   Staphylococcus lugdunensis NOT DETECTED NOT DETECTED Final   Streptococcus species NOT DETECTED NOT DETECTED Final   Streptococcus agalactiae NOT DETECTED NOT DETECTED Final   Streptococcus pneumoniae NOT DETECTED NOT DETECTED Final   Streptococcus pyogenes NOT DETECTED NOT DETECTED Final   A.calcoaceticus-baumannii NOT DETECTED NOT DETECTED Final   Bacteroides fragilis NOT DETECTED NOT DETECTED Final   Enterobacterales NOT DETECTED NOT DETECTED Final   Enterobacter cloacae complex NOT DETECTED NOT DETECTED Final   Escherichia coli NOT DETECTED NOT DETECTED Final   Klebsiella aerogenes NOT DETECTED NOT DETECTED Final   Klebsiella oxytoca NOT DETECTED NOT DETECTED Final   Klebsiella pneumoniae NOT DETECTED NOT DETECTED Final   Proteus species NOT DETECTED NOT DETECTED Final   Salmonella species NOT DETECTED NOT DETECTED Final   Serratia marcescens NOT DETECTED NOT DETECTED Final   Haemophilus influenzae NOT DETECTED NOT DETECTED Final   Neisseria meningitidis NOT DETECTED NOT DETECTED Final   Pseudomonas aeruginosa NOT DETECTED NOT DETECTED Final   Stenotrophomonas maltophilia NOT DETECTED NOT DETECTED Final   Candida albicans NOT DETECTED NOT DETECTED Final   Candida auris NOT DETECTED NOT DETECTED Final   Candida glabrata NOT DETECTED NOT DETECTED Final   Candida krusei NOT DETECTED NOT DETECTED Final   Candida parapsilosis NOT DETECTED NOT DETECTED Final   Candida tropicalis NOT DETECTED NOT DETECTED Final   Cryptococcus neoformans/gattii NOT DETECTED NOT DETECTED Final   Meth resistant mecA/C and MREJ DETECTED (A)  NOT DETECTED Final    Comment: CRITICAL RESULT CALLED TO, READ BACK BY AND VERIFIED WITH: Jonn Shingles [phone] FCP Performed at [facility], [address], [place], [place] [address]   Body fluid culture w Gram Stain     Status: None  Collection Time: 03/29/21 12:10 AM   Specimen: Body Fluid  Result Value Ref Range Status   Specimen Description FLUID LEFT KNEE  Final   Special Requests SYRINGE  Final   Gram Stain FEW WBC SEEN NO ORGANISMS SEEN   Final   Culture   Final    NO GROWTH 3 DAYS Performed at [facility], [address] [place], [place] [address]    Report Status 04/01/2021 FINAL  Final  Blood Culture (routine x 2)     Status: None (Preliminary result)   Collection Time: 03/29/21  3:00 AM   Specimen: BLOOD  Result Value Ref Range Status   Specimen Description BLOOD SITE NOT SPECIFIED  Final   Special Requests   Final    BOTTLES DRAWN AEROBIC AND ANAEROBIC Blood Culture adequate volume   Culture   Final    NO GROWTH 3 DAYS Performed at [facility], [place] [address], [place], [place] [address]    Report Status PENDING  Incomplete         Radiology Studies: IR Removal Tun Cv Cath W/O FL  Result Date: 03/30/2021 INDICATION: Bacteremia.  Request for tunneled hemodialysis catheter removal. EXAM: REMOVAL OF TUNNELED HEMODIALYSIS CATHETER MEDICATIONS: 10 mL 1% lidocaine COMPLICATIONS: None immediate. PROCEDURE: Informed written consent was obtained from the patient following an explanation of the procedure, risks, benefits and alternatives to treatment. A time out was performed prior to the initiation of the procedure. Maximal barrier sterile technique was utilized including caps, mask, sterile gowns, sterile gloves, large sterile drape, and hand hygiene. ChloraPrep was used to prep the patient's right neck, chest and existing catheter. 1% lidocaine with epinephrine was injected around the catheter and the subcutaneous tunnel. The catheter was  dissected out using scissors and curved hemostats until the cuff was freed from the surrounding fibrous sheath. Partial cuff retained, catheter was removed intact otherwise. Hemostasis was obtained with manual compression. A dressing was placed. The patient tolerated the procedure well without immediate post procedural complication. IMPRESSION: Successful removal of tunneled dialysis catheter. Read by: Durenda Guthrie, PA-C Electronically Signed   By: Jacqulynn Cadet M.D.   On: 03/30/2021 11:06        Scheduled Meds: . amLODipine  5 mg Oral QHS  . aspirin  81 mg Oral Daily  . atorvastatin  40 mg Oral QHS  . calcium acetate  667 mg Oral TID with meals  . carvedilol  12.5 mg Oral BID WC  . Chlorhexidine Gluconate Cloth  6 each Topical Q0600  . heparin  5,000 Units Subcutaneous Q8H  . insulin aspart  0-15 Units Subcutaneous TID WC  . insulin aspart  0-5 Units Subcutaneous QHS  . insulin aspart  5 Units Subcutaneous TID WC  . insulin regular human CONCENTRATED  95 Units Subcutaneous Q breakfast   And  . insulin regular human CONCENTRATED  45 Units Subcutaneous Q supper  . nortriptyline  10 mg Oral QHS  . pantoprazole  20 mg Oral Daily  . sertraline  100 mg Oral Daily   Continuous Infusions: . vancomycin Stopped (03/31/21 1329)     LOS: 3 days    Time spent: 35 minutes.     Elmarie Shiley, MD [facility]   If 7PM-7AM, please contact night-coverage [email or web address]  04/01/2021, 9:51 AM

## 2021-04-01 NOTE — Consult Note (Signed)
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 Velda Shell is an 60 y.o. male.  HPI: Patient was admitted with significant knee pain, fever and concern for sepsis.  X-rays revealed loosening of his total knee arthroplasty component within the tibia and stable femur component.  Patient had a history of total knee arthroplasty several years ago and underwent revision in January 2021 for infection.  He states has had multiple knee surgeries.  He has continued pain in his knee and difficulty ambulating.  On my evaluation aspiration from the knee has had a negative culture so far.  Patient did have MRSA bacteremia.  Patient's knee makes it difficult for him to ambulate.  He is not interested in returning to [facility] where he feels as though he had poor care.  He would like definitive treatment for his problem.  Past Medical History:  Diagnosis Date  . Arthritis   . Blood transfusion without reported diagnosis   . CHF (congestive heart failure) ([place])   . COPD (chronic obstructive pulmonary disease) ([place])   . Diabetes mellitus without complication ([place])   . Hypertension   . PTSD (post-traumatic stress disorder)   . Renal disorder    Stage 5 Kidney Failure    Past Surgical History:  Procedure Laterality Date  . IR REMOVAL TUN CV CATH W/O FL  03/30/2021  . JOINT REPLACEMENT     Bilateral knees    History reviewed. No pertinent family history.  Social History:  reports that he quit smoking about 10 years ago. His smoking use included cigarettes. He has never used smokeless tobacco. He reports current alcohol use. He reports previous drug use. Drug: Marijuana.  Allergies:  Allergies  Allergen Reactions  . Bupropion Swelling  . Dexmedetomidine Nausea And Vomiting    Other reaction(s): Other (See Comments) Dose-limiting bradycardia Dose-limiting bradycardia   . Ibuprofen Shortness Of Breath and Swelling    Pt tolerates  aspirin Pt tolerates aspirin   . Pioglitazone Anaphylaxis and Rash  . Tomato Anaphylaxis    Medications: I have reviewed the patient's current medications.  Results for orders placed or performed during the hospital encounter of 03/28/21 (from the past 48 hour(s))  Glucose, capillary     Status: Abnormal   Collection Time: 03/30/21  5:12 PM  Result Value Ref Range   Glucose-Capillary 381 (H) 70 - 99 mg/dL    Comment: Glucose reference range applies only to samples taken after fasting for at least 8 hours.  Glucose, capillary     Status: Abnormal   Collection Time: 03/30/21 10:02 PM  Result Value Ref Range   Glucose-Capillary 320 (H) 70 - 99 mg/dL    Comment: Glucose reference range applies only to samples taken after fasting for at least 8 hours.  CBC     Status: Abnormal   Collection Time: 03/31/21  5:00 AM  Result Value Ref Range   WBC 8.5 4.0 - 10.5 K/uL   RBC 2.78 (L) 4.22 - 5.81 MIL/uL   Hemoglobin 8.4 (L) 13.0 - 17.0 g/dL   HCT 25.6 (L) 39.0 - 52.0 %   MCV 92.1 80.0 - 100.0 fL   MCH 30.2 26.0 - 34.0 pg   MCHC 32.8 30.0 - 36.0 g/dL   RDW 16.8 (H) 11.5 - 15.5 %   Platelets 86 (L) 150 - 400 K/uL    Comment: Immature Platelet Fraction may be clinically indicated, consider ordering this additional test [id number] CONSISTENT WITH PREVIOUS RESULT  REPEATED TO VERIFY    nRBC 0.0 0.0 - 0.2 %    Comment: Performed at [facility], [place] [address], [place], [place] [address]  Comprehensive metabolic panel     Status: Abnormal   Collection Time: 03/31/21  5:00 AM  Result Value Ref Range   Sodium 125 (L) 135 - 145 mmol/L   Potassium 3.4 (L) 3.5 - 5.1 mmol/L   Chloride 88 (L) 98 - 111 mmol/L   CO2 24 22 - 32 mmol/L   Glucose, Bld 358 (H) 70 - 99 mg/dL    Comment: Glucose reference range applies only to samples taken after fasting for at least 8 hours.   BUN 94 (H) 6 - 20 mg/dL   Creatinine, Ser 4.98 (H) 0.61 - 1.24 mg/dL   Calcium 8.5 (L) 8.9 - 10.3 mg/dL   Total  Protein 7.2 6.5 - 8.1 g/dL   Albumin 2.9 (L) 3.5 - 5.0 g/dL   AST 19 15 - 41 U/L   ALT 19 0 - 44 U/L   Alkaline Phosphatase 63 38 - 126 U/L   Total Bilirubin 0.7 0.3 - 1.2 mg/dL   GFR, Estimated 13 (L) >60 mL/min    Comment: (NOTE) Calculated using the CKD-EPI Creatinine Equation (2021)    Anion gap 13 5 - 15    Comment: Performed at [facility], [place] [address], [place], [place] [address]  Phosphorus     Status: Abnormal   Collection Time: 03/31/21  5:00 AM  Result Value Ref Range   Phosphorus 6.8 (H) 2.5 - 4.6 mg/dL    Comment: Performed at [facility] [address] [place], [place] [address]  Glucose, capillary     Status: Abnormal   Collection Time: 03/31/21  8:03 AM  Result Value Ref Range   Glucose-Capillary 344 (H) 70 - 99 mg/dL    Comment: Glucose reference range applies only to samples taken after fasting for at least 8 hours.  Glucose, capillary     Status: Abnormal   Collection Time: 03/31/21  1:40 PM  Result Value Ref Range   Glucose-Capillary 180 (H) 70 - 99 mg/dL    Comment: Glucose reference range applies only to samples taken after fasting for at least 8 hours.  Glucose, capillary     Status: Abnormal   Collection Time: 03/31/21  4:41 PM  Result Value Ref Range   Glucose-Capillary 317 (H) 70 - 99 mg/dL    Comment: Glucose reference range applies only to samples taken after fasting for at least 8 hours.  Glucose, capillary     Status: Abnormal   Collection Time: 03/31/21  9:59 PM  Result Value Ref Range   Glucose-Capillary 266 (H) 70 - 99 mg/dL    Comment: Glucose reference range applies only to samples taken after fasting for at least 8 hours.  Glucose, capillary     Status: Abnormal   Collection Time: 04/01/21  8:22 AM  Result Value Ref Range   Glucose-Capillary 267 (H) 70 - 99 mg/dL    Comment: Glucose reference range applies only to samples taken after fasting for at least 8 hours.  Glucose, capillary     Status: Abnormal   Collection  Time: 04/01/21 12:20 PM  Result Value Ref Range   Glucose-Capillary 150 (H) 70 - 99 mg/dL    Comment: Glucose reference range applies only to samples taken after fasting for at least 8 hours.    No results found.  Review of Systems  Constitutional: Negative.   HENT: Negative.   Respiratory: Negative.   Cardiovascular: Negative.   Gastrointestinal: Negative.   Musculoskeletal:       Left knee pain and swelling  Skin: Negative.   Neurological: Negative.   Psychiatric/Behavioral: Negative.    Blood pressure 107/64, pulse 86, temperature 98.7 F (37.1 C), temperature source Axillary, resp. rate 17, height '5\' 5"'$  (1.651 m), weight 123 kg, SpO2 97 %. Physical Exam HENT:     Mouth/Throat:     Mouth: Mucous membranes are moist.  Eyes:     Extraocular Movements: Extraocular movements intact.  Cardiovascular:     Rate and Rhythm: Regular rhythm.  Pulmonary:     Effort: Pulmonary effort is normal.  Musculoskeletal:     Cervical back: Neck supple.     Comments: Left knee with evidence of prior total knee arthroplasty surgery.  Scars well-healed.  There is diffuse swelling about the knee.  He has tenderness to palpation diffusely about the knee.  He has pain with attempted range of motion about the knee.  No streaking erythema or fluctuance noted proximal or distal to the site of concern.  Skin:    General: Skin is warm.  Neurological:     Mental Status: He is alert.     Sensory: Sensory deficit present.  Psychiatric:        Mood and Affect: Mood normal.     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  I have spoken with several total knee arthroplasty surgeons in [place] and all have recommended definitive treatment as above the knee amputation.  I discussed this with the patient.  I also gave him the option of trying to transfer to a tertiary care center where they may be more suited to care for chronically infected arthroplasty  patients.  He has not interested in transfer or and talking about salvage procedures.  He would like to proceed with above-the-knee amputation as he has had multiple knee surgeries none of which have provided much relief or given him increased function.  He also understands the risk of chronic osteomyelitis and the concern for recurrent sepsis and the potential for this being a life-threatening condition.  With regard to the above-knee amputation patient would like to proceed with this.  We did discuss the risks, benefits and alternatives of surgery which include wound healing complications, continued infection, need for further surgery and difficulty with mobilization and continued pain.  We also discussed briefly the anesthetic risk including death.  This conversation will be further had with the anesthesiologist.  He would like to proceed with above-the-knee amputation therefore we will plan for Saturday, May 7 as the date of surgery.  Please hold anticoagulant medications prior to surgery.  He will be n.p.o. past midnight on Friday.  Erle Crocker 04/01/2021, 12:29 PM

## 2021-04-01 NOTE — Progress Notes (Signed)
Inpatient Diabetes Program Recommendations  AACE/ADA: New Consensus Statement on Inpatient Glycemic Control (2015)  Target Ranges:  Prepandial:   less than 140 mg/dL      Peak postprandial:   less than 180 mg/dL (1-2 hours)      Critically ill patients:  140 - 180 mg/dL   Lab Results  Component Value Date   GLUCAP 150 (H) 04/01/2021   HGBA1C 8.2 (H) 03/29/2021    Review of Glycemic Control  Inpatient Diabetes Program Recommendations:   Since restarted U 500 insulin, please consider: -D/C Novolog meal coverage Secure chat sent to Dr. Tyrell Antonio.  Thank you, Nani Gasser. Ashika Apuzzo, RN, MSN, CDE  Diabetes Coordinator Inpatient Glycemic Control Team Team Pager [phone] (8am-5pm) 04/01/2021 1:36 PM

## 2021-04-01 NOTE — Progress Notes (Signed)
    [facility] has been requested to perform a transesophageal echocardiogram on [name] for Bacteremia.  After careful review of history and examination, the risks and benefits of transesophageal echocardiogram have been explained including risks of esophageal damage, perforation (1:10,000 risk), bleeding, pharyngeal hematoma as well as other potential complications associated with conscious sedation including aspiration, arrhythmia, respiratory failure and death. Alternatives to treatment were discussed, questions were answered. Patient is willing to proceed.  TEE - Dr. Harrington Challenger 04/02/21 @ 1300. NPO after midnight. Meds with sips.   Leanor Kail, PA-C 04/01/2021 1:17 PM

## 2021-04-01 NOTE — Progress Notes (Signed)
[facility] Progress Note   Subjective:  Seen in room, eating breakfast. No events overnight No issues with dialysis yesterday net UF 2.7L   Objective Vitals:   03/31/21 2000 03/31/21 2349 04/01/21 0442 04/01/21 0820  BP: (!) 115/41 (!) 124/43 (!) 130/33 107/64  Pulse: 89 89 87 86  Resp: '19 15 18 17  '$ Temp: 99.8 F (37.7 C) 99.6 F (37.6 C) 99 F (37.2 C) 98.7 F (37.1 C)  TempSrc: Oral Axillary Axillary Axillary  SpO2: 100% 99% 98% 97%  Weight:      Height:         Additional Objective Labs: Basic Metabolic Panel: Recent Labs  Lab 03/29/21 1050 03/30/21 0607 03/31/21 0500  NA 127* 128* 125*  K 3.3* 4.2 3.4*  CL 88* 87* 88*  CO2 '23 25 24  '$ GLUCOSE 447* 311* 358*  BUN 102* 66* 94*  CREATININE 4.89* 4.51* 4.98*  CALCIUM 8.3* 8.5* 8.5*  PHOS  --   --  6.8*   CBC: Recent Labs  Lab 03/28/21 2149 03/29/21 1050 03/30/21 0607 03/31/21 0500  WBC 12.3* 12.6* 10.2 8.5  NEUTROABS 10.8*  --   --   --   HGB 10.5* 9.8* 10.4* 8.4*  HCT 32.5* 30.5* 32.5* 25.6*  MCV 91.0 92.4 92.6 92.1  PLT 139* 112* 90* 86*   Blood Culture    Component Value Date/Time   SDES BLOOD SITE NOT SPECIFIED 03/29/2021 0300   SPECREQUEST  03/29/2021 0300    BOTTLES DRAWN AEROBIC AND ANAEROBIC Blood Culture adequate volume   CULT  03/29/2021 0300    NO GROWTH 3 DAYS Performed at [facility], [place] [address], [place], [place] [address]    REPTSTATUS PENDING 03/29/2021 0300     Physical Exam General: Well appearing, nad  Heart: RRR no m,r,g  Lungs: Clear bilaterally  Abdomen: soft non-tender  Extremities: L knee s/p TKR, mild swelling, tender to palpation  Dialysis Access: RUE AVG +bruit; no erythema, warmth, tenderness   Medications: . vancomycin Stopped (03/31/21 1329)   . amLODipine  5 mg Oral QHS  . aspirin  81 mg Oral Daily  . atorvastatin  40 mg Oral QHS  . calcium acetate  667 mg Oral TID with meals  . carvedilol  12.5 mg Oral BID WC  .  Chlorhexidine Gluconate Cloth  6 each Topical Q0600  . heparin  5,000 Units Subcutaneous Q8H  . insulin aspart  0-15 Units Subcutaneous TID WC  . insulin aspart  0-5 Units Subcutaneous QHS  . insulin aspart  5 Units Subcutaneous TID WC  . insulin regular human CONCENTRATED  95 Units Subcutaneous Q breakfast   And  . insulin regular human CONCENTRATED  45 Units Subcutaneous Q supper  . nortriptyline  10 mg Oral QHS  . pantoprazole  20 mg Oral Daily  . sertraline  100 mg Oral Daily    Dialysis Orders:   [facility] MWF    4h  121kg  3K/2.5 bath  RUE AVG/ R IJ TDC  Hep 4500+ 2546md  - mircera 100 ug q2 last 4/27     Na 127  K 3.3 CO23  BUN 102  Cr 4.89  WBC 12K Hb 9.8     BP 159/60  HR 110- 125  Temp 102- 103   RR 18-25    CXR 5/01 - IMPRESSION: Low volumes with basilar opacities which may reflect atelectasis though edema or infection could have a similar appearance in the appropriate clinical setting.  Assessment/Plan: 1. MRSA bacteremia Fevers/ chills on admission -high fever in ED 103 deg. HD cath sepsis and/or septic joint L knee. BCx 5/1 +MRSA;  Knee fluid cx -ngtd. IV Vanc per pharmacy. TDC removed 5/3. Ortho consulted -for L AKA ?Sat  2. ESRD - on HD MWF.  Next HD 5/6.  3. HD access - [employer] placed Nov 2021 at [place], [place] AVG placed 01/30/21 also at [facility]. Had fistulogram 4/14 that showed patent AVG. TDC removed per IR 5/3. Should not need another [facility] as the AVG has been working well.  4. HTN/ vol - up by wt's, no edema on CXR. Nasal O2, not sure if uses O2 at home. Tolerating 3L UF.  UF to EDW as tolerated.  5. PTSD - on zoloft and others 6. COPD - per pmd 7. DM2 - per pmd, not on medication per pta list 8. Anemia ckd - Hgb 9.8 >10.6>8.4 Last esa on 4/27, due on 5/11 if still here.   9. MBD ckd - Ca ok, Phos not at goal. Cont phoslo (per pt 1 tab qac) , not on vdra.     Lynnda Child PA-C [facility] 04/01/2021,9:48 AM

## 2021-04-01 NOTE — Plan of Care (Signed)
  Problem: Education: Goal: Knowledge of General Education information will improve Description: Including pain rating scale, medication(s)/side effects and non-pharmacologic comfort measures Outcome: Progressing   Problem: Health Behavior/Discharge Planning: Goal: Ability to manage health-related needs will improve Outcome: Progressing   Problem: Clinical Measurements: Goal: Ability to maintain clinical measurements within normal limits will improve Outcome: Progressing   Problem: Clinical Measurements: Goal: Will remain free from infection Outcome: Progressing   Problem: Clinical Measurements: Goal: Diagnostic test results will improve Outcome: Progressing   Problem: Clinical Measurements: Goal: Respiratory complications will improve Outcome: Progressing   Problem: Clinical Measurements: Goal: Cardiovascular complication will be avoided Outcome: Progressing   Problem: Activity: Goal: Risk for activity intolerance will decrease Outcome: Progressing   Problem: Nutrition: Goal: Adequate nutrition will be maintained Outcome: Progressing   Problem: Coping: Goal: Level of anxiety will decrease Outcome: Progressing   Problem: Elimination: Goal: Will not experience complications related to bowel motility Outcome: Progressing   Problem: Elimination: Goal: Will not experience complications related to urinary retention Outcome: Progressing   Problem: Safety: Goal: Ability to remain free from injury will improve Outcome: Progressing   Problem: Skin Integrity: Goal: Risk for impaired skin integrity will decrease Outcome: Progressing   Problem: Fluid Volume: Goal: Hemodynamic stability will improve Outcome: Progressing   Problem: Respiratory: Goal: Ability to maintain adequate ventilation will improve Outcome: Progressing   Problem: Pain Managment: Goal: General experience of comfort will improve Outcome: Not Progressing

## 2021-04-02 ENCOUNTER — Other Ambulatory Visit: Payer: Self-pay | Admitting: Orthopaedic Surgery

## 2021-04-02 ENCOUNTER — Other Ambulatory Visit (HOSPITAL_COMMUNITY): Payer: Medicare Other

## 2021-04-02 ENCOUNTER — Encounter (HOSPITAL_COMMUNITY)
Admission: EM | Disposition: A | Discharge status: Skilled Nursing Facility | Payer: Self-pay | Source: Home / Self Care | Attending: Internal Medicine

## 2021-04-02 ENCOUNTER — Encounter (HOSPITAL_COMMUNITY): Payer: Self-pay | Admitting: Certified Registered Nurse Anesthetist

## 2021-04-02 ENCOUNTER — Inpatient Hospital Stay (HOSPITAL_COMMUNITY): Payer: Medicare Other

## 2021-04-02 DIAGNOSIS — R7881 Bacteremia: Secondary | ICD-10-CM | POA: Diagnosis not present

## 2021-04-02 DIAGNOSIS — B9562 Methicillin resistant Staphylococcus aureus infection as the cause of diseases classified elsewhere: Secondary | ICD-10-CM | POA: Diagnosis not present

## 2021-04-02 LAB — CBC
HCT: 30.5 % — ABNORMAL LOW (ref 39.0–52.0)
Hemoglobin: 9.4 g/dL — ABNORMAL LOW (ref 13.0–17.0)
MCH: 29 pg (ref 26.0–34.0)
MCHC: 30.8 g/dL (ref 30.0–36.0)
MCV: 94.1 fL (ref 80.0–100.0)
Platelets: 91 10*3/uL — ABNORMAL LOW (ref 150–400)
RBC: 3.24 MIL/uL — ABNORMAL LOW (ref 4.22–5.81)
RDW: 16.9 % — ABNORMAL HIGH (ref 11.5–15.5)
WBC: 5.7 10*3/uL (ref 4.0–10.5)
nRBC: 0 % (ref 0.0–0.2)

## 2021-04-02 LAB — GLUCOSE, CAPILLARY
Glucose-Capillary: 151 mg/dL — ABNORMAL HIGH (ref 70–99)
Glucose-Capillary: 194 mg/dL — ABNORMAL HIGH (ref 70–99)
Glucose-Capillary: 214 mg/dL — ABNORMAL HIGH (ref 70–99)
Glucose-Capillary: 282 mg/dL — ABNORMAL HIGH (ref 70–99)
Glucose-Capillary: 340 mg/dL — ABNORMAL HIGH (ref 70–99)
Glucose-Capillary: 42 mg/dL — CL (ref 70–99)
Glucose-Capillary: 428 mg/dL — ABNORMAL HIGH (ref 70–99)
Glucose-Capillary: 514 mg/dL (ref 70–99)
Glucose-Capillary: 578 mg/dL (ref 70–99)

## 2021-04-02 LAB — BASIC METABOLIC PANEL
Anion gap: 13 (ref 5–15)
BUN: 79 mg/dL — ABNORMAL HIGH (ref 6–20)
CO2: 22 mmol/L (ref 22–32)
Calcium: 9 mg/dL (ref 8.9–10.3)
Chloride: 92 mmol/L — ABNORMAL LOW (ref 98–111)
Creatinine, Ser: 4.03 mg/dL — ABNORMAL HIGH (ref 0.61–1.24)
GFR, Estimated: 16 mL/min — ABNORMAL LOW (ref >60)
Glucose, Bld: 290 mg/dL — ABNORMAL HIGH (ref 70–99)
Potassium: 4 mmol/L (ref 3.5–5.1)
Sodium: 127 mmol/L — ABNORMAL LOW (ref 135–145)

## 2021-04-02 LAB — BLOOD GAS, VENOUS
Acid-base deficit: 1.6 mmol/L (ref 0.0–2.0)
Bicarbonate: 24.3 mmol/L (ref 20.0–28.0)
FIO2: 21
O2 Saturation: 62.5 %
Patient temperature: 37
pCO2, Ven: 54 mmHg (ref 44.0–60.0)
pH, Ven: 7.276 (ref 7.250–7.430)
pO2, Ven: 39.3 mmHg (ref 32.0–45.0)

## 2021-04-02 SURGERY — CANCELLED PROCEDURE

## 2021-04-02 MED ORDER — ORAL CARE MOUTH RINSE
15.0000 mL | Freq: Two times a day (BID) | OROMUCOSAL | Status: DC
  Administered 2021-04-02 – 2021-04-11 (×9): 15 mL via OROMUCOSAL

## 2021-04-02 MED ORDER — INSULIN ASPART 100 UNIT/ML IJ SOLN
8.0000 [IU] | Freq: Once | INTRAMUSCULAR | Status: AC
  Administered 2021-04-02: 8 [IU] via SUBCUTANEOUS

## 2021-04-02 MED ORDER — PROSOURCE PLUS PO LIQD
30.0000 mL | Freq: Two times a day (BID) | ORAL | Status: DC
  Administered 2021-04-04 – 2021-04-08 (×7): 30 mL via ORAL
  Filled 2021-04-02 (×7): qty 30

## 2021-04-02 MED ORDER — NALOXONE HCL 0.4 MG/ML IJ SOLN
INTRAMUSCULAR | Status: AC
  Administered 2021-04-02: 0.4 mg
  Filled 2021-04-02: qty 1

## 2021-04-02 MED ORDER — INSULIN REGULAR HUMAN (CONC) 500 UNIT/ML ~~LOC~~ SOPN
30.0000 [IU] | PEN_INJECTOR | Freq: Every day | SUBCUTANEOUS | Status: DC
  Administered 2021-04-02 – 2021-04-03 (×2): 30 [IU] via SUBCUTANEOUS

## 2021-04-02 MED ORDER — LORAZEPAM 2 MG/ML IJ SOLN
0.5000 mg | Freq: Once | INTRAMUSCULAR | Status: AC
  Administered 2021-04-02: 0.5 mg via INTRAVENOUS
  Filled 2021-04-02: qty 1

## 2021-04-02 MED ORDER — DEXTROSE 50 % IV SOLN
INTRAVENOUS | Status: AC
  Filled 2021-04-02: qty 50

## 2021-04-02 MED ORDER — HYDROMORPHONE HCL 1 MG/ML IJ SOLN: 0.5000 mg | INTRAMUSCULAR | Status: DC | PRN

## 2021-04-02 MED ORDER — DEXTROSE 50 % IV SOLN
INTRAVENOUS | Status: AC
  Administered 2021-04-02: 50 mL
  Filled 2021-04-02: qty 50

## 2021-04-02 MED ORDER — NALOXONE HCL 0.4 MG/ML IJ SOLN
0.1000 mg | INTRAMUSCULAR | Status: DC | PRN
  Administered 2021-04-02 (×3): 0.1 mg via INTRAVENOUS
  Filled 2021-04-02: qty 1

## 2021-04-02 MED ORDER — INSULIN REGULAR HUMAN (CONC) 500 UNIT/ML ~~LOC~~ SOPN: 95.0000 [IU] | PEN_INJECTOR | Freq: Every day | SUBCUTANEOUS | Status: DC

## 2021-04-02 MED ORDER — CALCIUM ACETATE (PHOS BINDER) 667 MG PO CAPS
1334.0000 mg | ORAL_CAPSULE | Freq: Three times a day (TID) | ORAL | Status: DC
  Administered 2021-04-02 – 2021-04-14 (×23): 1334 mg via ORAL
  Filled 2021-04-02 (×24): qty 2

## 2021-04-02 MED ORDER — HYDROMORPHONE HCL 1 MG/ML IJ SOLN
0.5000 mg | INTRAMUSCULAR | Status: DC | PRN
  Administered 2021-04-02 – 2021-04-14 (×50): 0.5 mg via INTRAVENOUS
  Filled 2021-04-02 (×51): qty 0.5

## 2021-04-02 MED ORDER — VANCOMYCIN HCL IN DEXTROSE 1-5 GM/200ML-% IV SOLN
INTRAVENOUS | Status: AC
  Filled 2021-04-02: qty 200

## 2021-04-02 MED ORDER — INSULIN ASPART 100 UNIT/ML IJ SOLN
15.0000 [IU] | Freq: Once | INTRAMUSCULAR | Status: AC
  Administered 2021-04-02: 15 [IU] via SUBCUTANEOUS

## 2021-04-02 NOTE — Progress Notes (Signed)
Pt reports that he does not want to do the TEE today and just doesn't feel right. He refused to sign the consent form. Awaiting phlebotomy to draw VBG. Will continue to assess.

## 2021-04-02 NOTE — Anesthesia Preprocedure Evaluation (Signed)
Anesthesia Evaluation    Airway        Dental   Pulmonary COPD, former smoker,           Cardiovascular hypertension,   04/01/21 ECHO:  1. Left ventricular ejection fraction, by estimation, is 55 to 60%. The  left ventricle has normal function. The left ventricle has no regional  wall motion abnormalities. There is mild concentric left ventricular  hypertrophy. Left ventricular diastolic  parameters are consistent with Grade II diastolic dysfunction  (pseudonormalization).  2. The aortic valve is calcified out or proportion or age but within  normal limits in the sub-group of ESRD. Aortic valve regurgitation is not  visualized. Aortic valve sclerosis/calcification is present, without any  evidence of aortic stenosis.  3. Right ventricular systolic function is normal. The right ventricular  size is normal.  4. Left atrial size was mildly dilated.  5. The mitral valve is abnormal. No evidence of mitral valve  regurgitation. The mean mitral valve gradient is 3.0 mmHg with average  heart rate of 82 bpm.  6. The inferior vena cava is normal in size with greater than 50%  respiratory variability, suggesting right atrial pressure of 3 mmHg.    Neuro/Psych    GI/Hepatic   Endo/Other  diabetes, Poorly Controlled, Type 2Morbid obesity  Renal/GU ESRFRenal disease     Musculoskeletal  (+) Arthritis ,   Abdominal   Peds  Hematology   Anesthesia Other Findings bacteremia  Reproductive/Obstetrics                             Anesthesia Physical Anesthesia Plan Anesthesia Quick Evaluation

## 2021-04-02 NOTE — Progress Notes (Signed)
PROGRESS NOTE    Jeffrey [name]  WJX:[id number] DOB: 1961/09/05 DOA: 03/28/2021 PCP: Pcp, No   Brief Narrative: 60 year old with past medical history significant for ESRD,  diabetes presents to the ED complaining of fever.  Accompanied by vomiting, diaphoresis and mild confusion.  He has been having left knee pain.  Patient has a history of chronic knee  pain and swelling left knee, prior imagine at [facility].  Patient has a history of knee replacement/hardware bilaterally.  In the ED left knee imaging showed massive knee joint effusion.  Arthrocentesis was performed however only 1 cc of gelatinous red fluid was obtained by ED provider.  Patient was started empirically on IV antibiotics.  Patient was evaluated by orthopedic, who is recommending AKA>  Patient also diagnosed with MRSA bacteremia, underwent hemodialysis catheter removal.  Currently has RUE AVG (01/2021).  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 -Need repeat blood cultures to document clearance, cultures ordered by ID. ID will provide blood culture kit to HD units for blood culture to be drawn.  -2D ECHo calcification mitral and aortic valve, ID recommend TEE. --He was schedule for TEE, but he refuse./    2-Left prosthetic knee septic arthritis:  Arthrocentesis performed by ED only 1 cc gelatinous red fluid obtained.  Culture from arthrocentesis: no Growth to date.  CT; Irregular lucency around the  tibial processes which may reflect hardware loosening or infection.  Not a specific moderate sized knee joint effusion. -Dr Lucia Gaskins planning surgery on Saturday.  -Sign off earlier from HD, anesthesia asking for labs prior to sx vs completion of HD. Bmet ordered.   3-Diabetes type 2: Transition to Humulin R 500--95 units in am and 30 units PM.  Continue with  sliding scale insulin. Will decrease Humulin night dose to 30 to avoid hypoglycemia.   4-Severe Sepsis: Secondary to septic arthritis, patient presents with Fever, tachycardia, tachypnea.  Found to have MRSA Bacteremia. Undergoing work up.  Treated with IV antibiotics, fluids.   5-ESRD on HD;  HD catheter removed.  Appreciate nephrology assistance.   6-Hypersomnolence: Resolved.  Gabapentin, Nortriptyline trazodone, valium on hold.  -Resume Nortriptyline and valium lower dose PRN 5/04 -Resume trazadone 5/05.  -Earlier during hospitalization patient had a hypersomnolent episode.  Last night patient had a second episode were he was difficult to wake up.  He received Narcan, which improved his mental status.  This is his second episode I recommended an MRI.  Patient initially refused MRI but now agrees to.  Venous gas negative for hypercapnia, plan to proceed with MRI. -Dilaudid  dose decreased.  7-Thrombocytopenia; secondary to infectious process. Monitor  8-HTN; Continue with coreg, amlodipine.  Hyponatremia; correction with HD>      Estimated body mass index is 45.09 kg/m as calculated from the following:   Height as of this encounter: '5\' 5"'  (1.651 m).   Weight as of this encounter: 122.9 kg.   DVT prophylaxis: Heparin  Code Status: Full code Family  Communication: Care discussed with patient and wife who was at bedside. 5/04 Disposition Plan:  Status is: Inpatient  Remains inpatient appropriate because:IV treatments appropriate due to intensity of illness or inability to take PO   Dispo: The patient is from: Home               Anticipated d/c is to: to be determine              Patient currently is not medically stable to d/c.   Difficult to place patient No        Consultants:   ID  Nephrology  Orthopoedic  Procedures:   HD  Antimicrobials:  Vancomycin 03/29/2021  Subjective: Events from overnight reported, patient became lethargic difficult to wake up, after Narcan he wake up.  Patient seen in hemodialysis unit he was very upset, when I recommended MRI of the brain, he asked me to leave the hemodialysis unit  Subsequently nurse page me and said  patient wanted to be transferred to [facility].  I came and spoke with patient he was more calm, he said that he is want to stay now at [facility].  He is willing to proceed with MRI and TEE.  Objective: Vitals:   04/02/21 1000 04/02/21 1043 04/02/21 1103 04/02/21 1214  BP: 125/69 (!) 129/59 (!) 135/56 (!) 119/52  Pulse:   84 88  Resp:  '12 19 19  ' Temp:  98.2 F (36.8 C) 98.6 F (37 C) 98.9 F (37.2 C)  TempSrc:  Oral Oral Oral  SpO2:  99% 100% 100%  Weight:  122.9 kg    Height:        Intake/Output Summary (Last 24 hours) at 04/02/2021 1459 Last data filed at 04/02/2021 1043 Gross per 24 hour  Intake --  Output 1614 ml  Net -1614 ml   Filed Weights   03/31/21 1250 04/02/21 0758 04/02/21 1043  Weight: 123 kg 123 kg 122.9 kg    Examination:  General exam: NAD Respiratory system: CTA Cardiovascular system: S 1, S 2 RRR Gastrointestinal system: BS present, soft, nt Central nervous system: Alert Extremities: Left Knee with Edema    Data Reviewed: I have personally reviewed following labs and imaging studies  CBC: Recent Labs  Lab 03/28/21 2149 03/29/21 1050 03/30/21 0607 03/31/21 0500 04/02/21 0634  WBC 12.3* 12.6* 10.2 8.5 5.7  NEUTROABS 10.8*  --   --   --   --   HGB 10.5* 9.8* 10.4* 8.4* 9.4*  HCT 32.5* 30.5* 32.5* 25.6* 30.5*  MCV 91.0 92.4 92.6 92.1 94.1  PLT 139* 112* 90* 86* 91*   Basic  Metabolic Panel: Recent Labs  Lab 03/28/21 2149 03/29/21 1050 03/30/21 0607 03/31/21 0500 04/02/21 0634  NA 129* 127* 128* 125* 127*  K 3.7 3.3* 4.2 3.4* 4.0  CL 88* 88* 87* 88* 92*  CO2 '26 23 25 24 22  ' GLUCOSE 257* 447* 311* 358* 290*  BUN 98* 102* 66* 94* 79*  CREATININE 4.78* 4.89* 4.51* 4.98* 4.03*  CALCIUM 8.9 8.3* 8.5* 8.5* 9.0  PHOS  --   --   --  6.8*  --    GFR: Estimated Creatinine Clearance: 23.7 mL/min (A) (by C-G formula based on SCr of 4.03 mg/dL (H)). Liver Function Tests: Recent Labs  Lab 03/28/21 2149 03/30/21 0607 03/31/21 0500  AST '25 31 19  ' ALT '14 21 19  ' ALKPHOS 71 63 63  BILITOT 0.6 0.7 0.7  PROT 9.0* 7.7 7.2  ALBUMIN 3.7  3.3* 2.9*   No results for input(s): LIPASE, AMYLASE in the last 168 hours. No results for input(s): AMMONIA in the last 168 hours. Coagulation Profile: Recent Labs  Lab 03/28/21 2149  INR 1.2   Cardiac Enzymes: No results for input(s): CKTOTAL, CKMB, CKMBINDEX, TROPONINI in the last 168 hours. BNP (last 3 results) No results for input(s): PROBNP in the last 8760 hours. HbA1C: No results for input(s): HGBA1C in the last 72 hours. CBG: Recent Labs  Lab 04/02/21 0045 04/02/21 0057 04/02/21 0619 04/02/21 1107 04/02/21 1217  GLUCAP 42* 151* 282* 194* 214*   Lipid Profile: No results for input(s): CHOL, HDL, LDLCALC, TRIG, CHOLHDL, LDLDIRECT in the last 72 hours. Thyroid Function Tests: No results for input(s): TSH, T4TOTAL, FREET4, T3FREE, THYROIDAB in the last 72 hours. Anemia Panel: No results for input(s): VITAMINB12, FOLATE, FERRITIN, TIBC, IRON, RETICCTPCT in the last 72 hours. Sepsis Labs: Recent Labs  Lab 03/28/21 2358  LATICACIDVEN 1.3    Recent Results (from the past 240 hour(s))  Urine culture     Status: None   Collection Time: 03/28/21  9:49 PM   Specimen: In/Out Cath Urine  Result Value Ref Range Status   Specimen Description IN/OUT CATH URINE  Final   Special Requests NONE  Final   Culture    Final    NO GROWTH Performed at [facility], [place] [address], [place], [place] [address]    Report Status 03/30/2021 FINAL  Final  Resp Panel by RT-PCR (Flu A&B, Covid) Nasopharyngeal Swab     Status: None   Collection Time: 03/28/21 10:35 PM   Specimen: Nasopharyngeal Swab; Nasopharyngeal(NP) swabs in vial transport medium  Result Value Ref Range Status   SARS Coronavirus 2 by RT PCR NEGATIVE NEGATIVE Final    Comment: (NOTE) SARS-CoV-2 target nucleic acids are NOT DETECTED.  The SARS-CoV-2 RNA is generally detectable in upper respiratory specimens during the acute phase of infection. The lowest concentration of SARS-CoV-2 viral copies this assay can detect is 138 copies/mL. A negative result does not preclude SARS-Cov-2 infection and should not be used as the sole basis for treatment or other patient management decisions. A negative result may occur with  improper specimen collection/handling, submission of specimen other than nasopharyngeal swab, presence of viral mutation(s) within the areas targeted by this assay, and inadequate number of viral copies(<138 copies/mL). A negative result must be combined with clinical observations, patient history, and epidemiological information. The expected result is Negative.  Fact Sheet for Patients:  EntrepreneurPulse.com.au  Fact Sheet for Healthcare Providers:  IncredibleEmployment.be  This test is no t yet approved or cleared by the [place] FDA and  has been authorized for detection and/or diagnosis of SARS-CoV-2 by FDA under an Emergency Use Authorization (EUA). This EUA will remain  in effect (meaning this test can be used) for the duration of the COVID-19 declaration under Section 564(b)(1) of the Act, 21 U.S.C.section 360bbb-3(b)(1), unless the authorization is terminated  or revoked sooner.       Influenza A by PCR NEGATIVE NEGATIVE Final   Influenza B by PCR NEGATIVE NEGATIVE  Final    Comment: (NOTE) The Xpert Xpress SARS-CoV-2/FLU/RSV plus assay is intended as an aid in the diagnosis of influenza from Nasopharyngeal swab specimens and should not be used as a sole basis for treatment. Nasal washings and aspirates are unacceptable for Xpert Xpress SARS-CoV-2/FLU/RSV testing.  Fact Sheet for Patients: EntrepreneurPulse.com.au  Fact Sheet for Healthcare Providers: IncredibleEmployment.be  This test is not  yet approved or cleared by the [place] and has been authorized for detection and/or diagnosis of SARS-CoV-2 by FDA under an Emergency Use Authorization (EUA). This EUA will remain in effect (meaning this test can be used) for the duration of the COVID-19 declaration under Section 564(b)(1) of the Act, 21 U.S.C. section 360bbb-3(b)(1), unless the authorization is terminated or revoked.  Performed at [facility], [place] [address], [place], [place] [address]   Blood Culture (routine x 2)     Status: Abnormal   Collection Time: 03/28/21 11:30 PM   Specimen: BLOOD LEFT FOREARM  Result Value Ref Range Status   Specimen Description BLOOD LEFT FOREARM  Final   Special Requests   Final    BOTTLES DRAWN AEROBIC AND ANAEROBIC Blood Culture results may not be optimal due to an inadequate volume of blood received in culture bottles   Culture  Setup Time   Final    GRAM POSITIVE COCCI IN CLUSTERS IN BOTH AEROBIC AND ANAEROBIC BOTTLES CRITICAL RESULT CALLED TO, READ BACK BY AND VERIFIED WITH: [place] [id number] FCP Performed at [facility], [place] [address] [place], [place] [address]    Culture METHICILLIN RESISTANT STAPHYLOCOCCUS AUREUS (A)  Final   Report Status 03/31/2021 FINAL  Final   Organism ID, Bacteria METHICILLIN RESISTANT STAPHYLOCOCCUS AUREUS  Final      Susceptibility   Methicillin resistant staphylococcus aureus - MIC*    CIPROFLOXACIN >=8 RESISTANT Resistant     ERYTHROMYCIN >=8  RESISTANT Resistant     GENTAMICIN <=0.5 SENSITIVE Sensitive     OXACILLIN >=4 RESISTANT Resistant     TETRACYCLINE <=1 SENSITIVE Sensitive     VANCOMYCIN <=0.5 SENSITIVE Sensitive     TRIMETH/SULFA <=10 SENSITIVE Sensitive     CLINDAMYCIN <=0.25 SENSITIVE Sensitive     RIFAMPIN <=0.5 SENSITIVE Sensitive     Inducible Clindamycin NEGATIVE Sensitive     * METHICILLIN RESISTANT STAPHYLOCOCCUS AUREUS  Blood Culture ID Panel (Reflexed)     Status: Abnormal   Collection Time: 03/28/21 11:30 PM  Result Value Ref Range Status   Enterococcus faecalis NOT DETECTED NOT DETECTED Final   Enterococcus Faecium NOT DETECTED NOT DETECTED Final   Listeria monocytogenes NOT DETECTED NOT DETECTED Final   Staphylococcus species DETECTED (A) NOT DETECTED Final    Comment: CRITICAL RESULT CALLED TO, READ BACK BY AND VERIFIED WITH: PHARMD HALEY V. [phone] FCP    Staphylococcus aureus (BCID) DETECTED (A) NOT DETECTED Final    Comment: Methicillin (oxacillin)-resistant Staphylococcus aureus (MRSA). MRSA is predictably resistant to beta-lactam antibiotics (except ceftaroline). Preferred therapy is vancomycin unless clinically contraindicated. Patient requires contact precautions if  hospitalized. CRITICAL RESULT CALLED TO, READ BACK BY AND VERIFIED WITH: PHARMD HALEY V. [id number] FCP    Staphylococcus epidermidis NOT DETECTED NOT DETECTED Final   Staphylococcus lugdunensis NOT DETECTED NOT DETECTED Final   Streptococcus species NOT DETECTED NOT DETECTED Final   Streptococcus agalactiae NOT DETECTED NOT DETECTED Final   Streptococcus pneumoniae NOT DETECTED NOT DETECTED Final   Streptococcus pyogenes NOT DETECTED NOT DETECTED Final   A.calcoaceticus-baumannii NOT DETECTED NOT DETECTED Final   Bacteroides fragilis NOT DETECTED NOT DETECTED Final   Enterobacterales NOT DETECTED NOT DETECTED Final   Enterobacter cloacae complex NOT DETECTED NOT DETECTED Final   Escherichia coli NOT DETECTED NOT  DETECTED Final   Klebsiella aerogenes NOT DETECTED NOT DETECTED Final   Klebsiella oxytoca NOT DETECTED NOT DETECTED Final   Klebsiella pneumoniae NOT DETECTED  NOT DETECTED Final   Proteus species NOT DETECTED NOT DETECTED Final   Salmonella species NOT DETECTED NOT DETECTED Final   Serratia marcescens NOT DETECTED NOT DETECTED Final   Haemophilus influenzae NOT DETECTED NOT DETECTED Final   Neisseria meningitidis NOT DETECTED NOT DETECTED Final   Pseudomonas aeruginosa NOT DETECTED NOT DETECTED Final   Stenotrophomonas maltophilia NOT DETECTED NOT DETECTED Final   Candida albicans NOT DETECTED NOT DETECTED Final   Candida auris NOT DETECTED NOT DETECTED Final   Candida glabrata NOT DETECTED NOT DETECTED Final   Candida krusei NOT DETECTED NOT DETECTED Final   Candida parapsilosis NOT DETECTED NOT DETECTED Final   Candida tropicalis NOT DETECTED NOT DETECTED Final   Cryptococcus neoformans/gattii NOT DETECTED NOT DETECTED Final   Meth resistant mecA/C and MREJ DETECTED (A) NOT DETECTED Final    Comment: CRITICAL RESULT CALLED TO, READ BACK BY AND VERIFIED WITH: Jonn Shingles [id number] [phone] FCP Performed at [facility], [place] [address] [place], [place] [address]   Body fluid culture w Gram Stain     Status: None   Collection Time: 03/29/21 12:10 AM   Specimen: Body Fluid  Result Value Ref Range Status   Specimen Description FLUID LEFT KNEE  Final   Special Requests SYRINGE  Final   Gram Stain FEW WBC SEEN NO ORGANISMS SEEN   Final   Culture   Final    NO GROWTH 3 DAYS Performed at [facility], [address], [place], [place] [address]    Report Status 04/01/2021 FINAL  Final  Blood Culture (routine x 2)     Status: None (Preliminary result)   Collection Time: 03/29/21  3:00 AM   Specimen: Peripheral; Blood  Result Value Ref Range Status   Specimen Description BLOOD SITE NOT SPECIFIED  Final   Special Requests   Final    BOTTLES DRAWN AEROBIC AND ANAEROBIC  Blood Culture adequate volume   Culture   Final    NO GROWTH 4 DAYS Performed at [facility], [place] [address], [place], [place] [address]    Report Status PENDING  Incomplete         Radiology Studies: ECHOCARDIOGRAM COMPLETE  Result Date: 04/01/2021    ECHOCARDIOGRAM REPORT   Patient Name:   BURLIN MCNAIR Date of Exam: 04/01/2021 Medical Rec #:  [id number]   Height:       65.0 in Accession #:    [id number]  Weight:       271.2 lb Date of Birth:  08-21-1961   BSA:          2.251 m Patient Age:    67 years    BP:           118/63 mmHg Patient Gender: M           HR:           87 bpm. Exam Location:  Inpatient Procedure: 2D Echo, Cardiac Doppler and Color Doppler Indications:    Bacteremia  History:        Patient has no prior history of Echocardiogram examinations.                 CHF, COPD; Risk Factors:Hypertension and Diabetes.  Sonographer:    Cammy Brochure Referring Phys: [id number] [place]  1. Left ventricular ejection fraction, by estimation, is 55 to 60%. The left ventricle has normal function. The left ventricle has no regional wall motion abnormalities. There is  mild concentric left ventricular hypertrophy. Left ventricular diastolic parameters are consistent with Grade II diastolic dysfunction (pseudonormalization).  2. The aortic valve is calcified out or proportion or age but within normal limits in the sub-group of ESRD. Aortic valve regurgitation is not visualized. Aortic valve sclerosis/calcification is present, without any evidence of aortic stenosis.  3. Right ventricular systolic function is normal. The right ventricular size is normal.  4. Left atrial size was mildly dilated.  5. The mitral valve is abnormal. No evidence of mitral valve regurgitation. The mean mitral valve gradient is 3.0 mmHg with average heart rate of 82 bpm.  6. The inferior vena cava is normal in size with greater than 50% respiratory variability, suggesting right atrial pressure of 3 mmHg.  Comparison(s): No prior Echocardiogram. FINDINGS  Left Ventricle: Left ventricular ejection fraction, by estimation, is 55 to 60%. The left ventricle has normal function. The left ventricle has no regional wall motion abnormalities. The left ventricular internal cavity size was normal in size. There is  mild concentric left ventricular hypertrophy. Left ventricular diastolic parameters are consistent with Grade II diastolic dysfunction (pseudonormalization). Right Ventricle: The right ventricular size is normal. No increase in right ventricular wall thickness. Right ventricular systolic function is normal. Left Atrium: Left atrial size was mildly dilated. Right Atrium: Right atrial size was normal in size. Pericardium: There is no evidence of pericardial effusion. Mitral Valve: The mitral valve is abnormal. There is moderate calcification of the mitral valve leaflet(s). Mild to moderate mitral annular calcification. No evidence of mitral valve regurgitation. MV peak gradient, 8.5 mmHg. The mean mitral valve gradient is 3.0 mmHg with average heart rate of 82 bpm. Tricuspid Valve: The tricuspid valve is grossly normal. Tricuspid valve regurgitation is not demonstrated. No evidence of tricuspid stenosis. Aortic Valve: The aortic valve is calcified. Aortic valve regurgitation is not visualized. Mild to moderate aortic valve sclerosis/calcification is present, without any evidence of aortic stenosis. Aortic valve mean gradient measures 5.0 mmHg. Aortic valve peak gradient measures 10.4 mmHg. Aortic valve area, by VTI measures 2.63 cm. Pulmonic Valve: The pulmonic valve was grossly normal. Pulmonic valve regurgitation is not visualized. No evidence of pulmonic stenosis. Aorta: The aortic root and ascending aorta are structurally normal, with no evidence of dilitation. Venous: The inferior vena cava is normal in size with greater than 50% respiratory variability, suggesting right atrial pressure of 3 mmHg. IAS/Shunts: The  atrial septum is grossly normal.  LEFT VENTRICLE PLAX 2D LVIDd:         4.70 cm  Diastology LVIDs:         3.20 cm  LV e' medial:    7.72 cm/s LV PW:         1.30 cm  LV E/e' medial:  15.9 LV IVS:        1.20 cm  LV e' lateral:   7.83 cm/s LVOT diam:     2.10 cm  LV E/e' lateral: 15.7 LV SV:         75 LV SV Index:   33 LVOT Area:     3.46 cm  RIGHT VENTRICLE             IVC RV S prime:     11.20 cm/s  IVC diam: 1.70 cm TAPSE (M-mode): 2.0 cm LEFT ATRIUM             Index       RIGHT ATRIUM           Index LA  diam:        4.90 cm 2.18 cm/m  RA Area:     16.00 cm LA Vol (A2C):   80.4 ml 35.71 ml/m RA Volume:   43.50 ml  19.32 ml/m LA Vol (A4C):   87.5 ml 38.87 ml/m LA Biplane Vol: 88.6 ml 39.35 ml/m  AORTIC VALVE AV Area (Vmax):    2.43 cm AV Area (Vmean):   2.62 cm AV Area (VTI):     2.63 cm AV Vmax:           161.00 cm/s AV Vmean:          100.000 cm/s AV VTI:            0.286 m AV Peak Grad:      10.4 mmHg AV Mean Grad:      5.0 mmHg LVOT Vmax:         113.00 cm/s LVOT Vmean:        75.500 cm/s LVOT VTI:          0.217 m LVOT/AV VTI ratio: 0.76  AORTA Ao Root diam: 3.30 cm Ao Asc diam:  3.10 cm MITRAL VALVE MV Area (PHT): 4.21 cm     SHUNTS MV Area VTI:   2.09 cm     Systemic VTI:  0.22 m MV Peak grad:  8.5 mmHg     Systemic Diam: 2.10 cm MV Mean grad:  3.0 mmHg MV Vmax:       1.46 m/s MV Vmean:      78.5 cm/s MV Decel Time: 180 msec MV E velocity: 123.00 cm/s MV A velocity: 113.00 cm/s MV E/A ratio:  1.09 Rudean Haskell MD Electronically signed by Rudean Haskell MD Signature Date/Time: 04/01/2021/4:03:06 PM    Final         Scheduled Meds: . (feeding supplement) PROSource Plus  30 mL Oral BID BM  . aspirin  81 mg Oral Daily  . atorvastatin  40 mg Oral QHS  . calcium acetate  1,334 mg Oral TID with meals  . carvedilol  12.5 mg Oral BID WC  . Chlorhexidine Gluconate Cloth  6 each Topical Q0600  . heparin  5,000 Units Subcutaneous Q8H  . insulin aspart  0-15 Units Subcutaneous  TID WC  . insulin aspart  0-5 Units Subcutaneous QHS  . insulin regular human CONCENTRATED  30 Units Subcutaneous Q supper   And  . insulin regular human CONCENTRATED  95 Units Subcutaneous Q breakfast  . LORazepam  0.5 mg Intravenous Once  . nortriptyline  10 mg Oral QHS  . pantoprazole  20 mg Oral Daily  . sertraline  100 mg Oral Daily   Continuous Infusions: . sodium chloride    . vancomycin Stopped (03/31/21 1329)     LOS: 4 days    Time spent: 35 minutes.     Elmarie Shiley, MD [facility]   If 7PM-7AM, please contact night-coverage [email or web address]  04/02/2021, 2:59 PM

## 2021-04-02 NOTE — Progress Notes (Signed)
Hypoglycemic Event  CBG:42  Treatment: D50 50 mL (25 gm)  Symptoms: Sweaty and lethargic  Follow-up CBG: Time:0057 CBG Result: 151  Possible Reasons for Event: Inadequate meal intake  Comments/MD notified: [place], DO notified, pt is still lethargic after BG recheck.  Pt getting Dilaudid PRN and cocern for build up of medication in system. MD orders for Narcan. Will continue to assess.    Jeffrey [name]  Jeffrey [name]

## 2021-04-02 NOTE — Progress Notes (Signed)
[facility]ious [facility]  Date of Admission:  03/28/2021     Total days of antibiotics 6         ASSESSMENT:  Mr. Schnaible is scheduled for AKA with Dr. Dorian Heckle tomorrow. Blood culture tubes brought to HD unit to draw blood culture per previous discussion with Nephrology and Infection Prevention. TTE without evidence of endocarditis and currently refusing to sign consent for TEE. If no TEE is completed will plan on 6 weeks of antibiotics with dialysis which may be able to be shortened with negative TEE. Discussed plan of care.   Dr. Baxter Flattery will be available as needed for ID questions or concerns over the weekend.  Dr. Juleen China to follow up on Monday.   PLAN:  1. Continue vancomycin with dialysis. 2. Blood cultures during HD to check for clearance of bacteremia. 3. TEE if willing.  4. Plan for AKA tomorrow with Dr. Lucia Gaskins.    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 (feeding supplement) PROSource Plus  30 mL Oral BID BM  . aspirin  81 mg Oral Daily  . atorvastatin  40 mg Oral QHS  . calcium acetate  1,334 mg Oral TID with meals  . carvedilol  12.5 mg Oral BID WC  . Chlorhexidine Gluconate Cloth  6 each Topical Q0600  . heparin  5,000 Units Subcutaneous Q8H  . insulin aspart  0-15 Units Subcutaneous TID WC  . insulin aspart  0-5 Units Subcutaneous QHS  . insulin regular human CONCENTRATED  30 Units Subcutaneous Q supper   And  . insulin regular human CONCENTRATED  95 Units Subcutaneous Q breakfast  . LORazepam  0.5 mg Intravenous Once  . nortriptyline   10 mg Oral QHS  . pantoprazole  20 mg Oral Daily  . sertraline  100 mg Oral Daily    SUBJECTIVE:  Afebrile overnight. Nursing notes about confusion and hypoglycemia. Denies fevers/chills.   Allergies  Allergen Reactions  . Bupropion Swelling  . Dexmedetomidine Nausea And Vomiting    Other reaction(s): Other (See Comments) Dose-limiting bradycardia Dose-limiting bradycardia   . Ibuprofen Shortness Of Breath and Swelling    Pt tolerates aspirin Pt tolerates aspirin   . Pioglitazone Anaphylaxis and Rash  . Tomato Anaphylaxis     Review of Systems: Review of Systems  Constitutional: Negative for chills, fever and weight loss.  Respiratory: Negative for cough, shortness of breath and wheezing.   Cardiovascular: Negative for chest pain and leg swelling.  Gastrointestinal: Negative for abdominal pain, constipation, diarrhea, nausea and vomiting.  Skin: Negative for rash.      OBJECTIVE: Vitals:   04/02/21 1000 04/02/21 1043 04/02/21 1103 04/02/21 1214  BP: 125/69 (!) 129/59 (!) 135/56 (!) 119/52  Pulse:   84 88  Resp:  '12 19 19  '$ Temp:  98.2 F (36.8 C) 98.6 F (37 C) 98.9 F (37.2 C)  TempSrc:  Oral Oral Oral  SpO2:  99% 100% 100%  Weight:  122.9 kg    Height:       Body mass index is 45.09 kg/m.  Physical Exam Constitutional:      General: He is not in acute distress.    Appearance: He is well-developed.     Comments: Lying in bed with head of bed elevated; grumpy  Cardiovascular:     Rate and Rhythm: Normal rate and regular rhythm.     Heart sounds: Normal heart sounds.  Pulmonary:     Effort: Pulmonary effort is normal.     Breath sounds: Normal breath sounds.  Skin:    General: Skin is warm and dry.  Neurological:     Mental Status: He is alert and oriented to person, place, and time.     Lab Results Lab Results  Component Value Date   WBC 5.7 04/02/2021   HGB 9.4 (L) 04/02/2021   HCT 30.5 (L) 04/02/2021   MCV 94.1 04/02/2021   PLT 91 (L)  04/02/2021    Lab Results  Component Value Date   CREATININE 4.03 (H) 04/02/2021   BUN 79 (H) 04/02/2021   NA 127 (L) 04/02/2021   K 4.0 04/02/2021   CL 92 (L) 04/02/2021   CO2 22 04/02/2021    Lab Results  Component Value Date   ALT 19 03/31/2021   AST 19 03/31/2021   ALKPHOS 63 03/31/2021   BILITOT 0.7 03/31/2021     Microbiology: Recent Results (from the past 240 hour(s))  Urine culture     Status: None   Collection Time: 03/28/21  9:49 PM   Specimen: In/Out Cath Urine  Result Value Ref Range Status   Specimen Description IN/OUT CATH URINE  Final   Special Requests NONE  Final   Culture   Final    NO GROWTH Performed at [facility], [address] [place], [place] [address]    Report Status 03/30/2021 FINAL  Final  Resp Panel by RT-PCR (Flu A&B, Covid) Nasopharyngeal Swab     Status: None   Collection Time: 03/28/21 10:35 PM   Specimen: Nasopharyngeal Swab; Nasopharyngeal(NP) swabs in vial transport medium  Result Value Ref Range Status   SARS Coronavirus 2 by RT PCR NEGATIVE NEGATIVE Final    Comment: (NOTE) SARS-CoV-2 target nucleic acids are NOT DETECTED.  The SARS-CoV-2 RNA is generally detectable in upper respiratory specimens during the acute phase of infection. The lowest concentration of SARS-CoV-2 viral copies this assay can detect is 138 copies/mL. A negative result does not preclude SARS-Cov-2 infection and should not be used as the sole basis for treatment or other patient management decisions. A negative result may occur with  improper specimen collection/handling, submission of specimen other than nasopharyngeal swab, presence of viral mutation(s) within the areas targeted by this assay, and inadequate number of viral copies(<138 copies/mL). A negative result must be combined with clinical observations, patient history, and epidemiological information. The expected result is Negative.  Fact Sheet for Patients:   EntrepreneurPulse.com.au  Fact Sheet for Healthcare Providers:  IncredibleEmployment.be  This test is no t yet approved or cleared by the [place] FDA and  has been authorized for detection and/or diagnosis of SARS-CoV-2 by FDA under an Emergency Use Authorization (EUA). This EUA will remain  in effect (meaning this test can be used) for the duration of the COVID-19 declaration under Section 564(b)(1) of the Act, 21 U.S.C.section 360bbb-3(b)(1), unless the authorization is terminated  or revoked sooner.       Influenza A by PCR NEGATIVE NEGATIVE Final   Influenza B by PCR NEGATIVE NEGATIVE Final    Comment: (NOTE) The Xpert  Xpress SARS-CoV-2/FLU/RSV plus assay is intended as an aid in the diagnosis of influenza from Nasopharyngeal swab specimens and should not be used as a sole basis for treatment. Nasal washings and aspirates are unacceptable for Xpert Xpress SARS-CoV-2/FLU/RSV testing.  Fact Sheet for Patients: EntrepreneurPulse.com.au  Fact Sheet for Healthcare Providers: IncredibleEmployment.be  This test is not yet approved or cleared by the [place] FDA and has been authorized for detection and/or diagnosis of SARS-CoV-2 by FDA under an Emergency Use Authorization (EUA). This EUA will remain in effect (meaning this test can be used) for the duration of the COVID-19 declaration under Section 564(b)(1) of the Act, 21 U.S.C. section 360bbb-3(b)(1), unless the authorization is terminated or revoked.  Performed at [facility], [place] [address], [place], [place] [address]   Blood Culture (routine x 2)     Status: Abnormal   Collection Time: 03/28/21 11:30 PM   Specimen: BLOOD LEFT FOREARM  Result Value Ref Range Status   Specimen Description BLOOD LEFT FOREARM  Final   Special Requests   Final    BOTTLES DRAWN AEROBIC AND ANAEROBIC Blood Culture results may not be optimal due to an  inadequate volume of blood received in culture bottles   Culture  Setup Time   Final    GRAM POSITIVE COCCI IN CLUSTERS IN BOTH AEROBIC AND ANAEROBIC BOTTLES CRITICAL RESULT CALLED TO, READ BACK BY AND VERIFIED WITH: Borger [id number] FCP Performed at [facility], [place] [address], [place],  [address]    Culture METHICILLIN RESISTANT STAPHYLOCOCCUS AUREUS (A)  Final   Report Status 03/31/2021 FINAL  Final   Organism ID, Bacteria METHICILLIN RESISTANT STAPHYLOCOCCUS AUREUS  Final      Susceptibility   Methicillin resistant staphylococcus aureus - MIC*    CIPROFLOXACIN >=8 RESISTANT Resistant     ERYTHROMYCIN >=8 RESISTANT Resistant     GENTAMICIN <=0.5 SENSITIVE Sensitive     OXACILLIN >=4 RESISTANT Resistant     TETRACYCLINE <=1 SENSITIVE Sensitive     VANCOMYCIN <=0.5 SENSITIVE Sensitive     TRIMETH/SULFA <=10 SENSITIVE Sensitive     CLINDAMYCIN <=0.25 SENSITIVE Sensitive     RIFAMPIN <=0.5 SENSITIVE Sensitive     Inducible Clindamycin NEGATIVE Sensitive     * METHICILLIN RESISTANT STAPHYLOCOCCUS AUREUS  Blood Culture ID Panel (Reflexed)     Status: Abnormal   Collection Time: 03/28/21 11:30 PM  Result Value Ref Range Status   Enterococcus faecalis NOT DETECTED NOT DETECTED Final   Enterococcus Faecium NOT DETECTED NOT DETECTED Final   Listeria monocytogenes NOT DETECTED NOT DETECTED Final   Staphylococcus species DETECTED (A) NOT DETECTED Final    Comment: CRITICAL RESULT CALLED TO, READ BACK BY AND VERIFIED WITH: PHARMD HALEY V. [id number] FCP    Staphylococcus aureus (BCID) DETECTED (A) NOT DETECTED Final    Comment: Methicillin (oxacillin)-resistant Staphylococcus aureus (MRSA). MRSA is predictably resistant to beta-lactam antibiotics (except ceftaroline). Preferred therapy is vancomycin unless clinically contraindicated. Patient requires contact precautions if  hospitalized. CRITICAL RESULT CALLED TO, READ BACK BY AND VERIFIED WITH: PHARMD HALEY  V. [id number] FCP    Staphylococcus epidermidis NOT DETECTED NOT DETECTED Final   Staphylococcus lugdunensis NOT DETECTED NOT DETECTED Final   Streptococcus species NOT DETECTED NOT DETECTED Final   Streptococcus agalactiae NOT DETECTED NOT DETECTED Final   Streptococcus pneumoniae NOT DETECTED NOT DETECTED Final   Streptococcus pyogenes NOT DETECTED NOT DETECTED Final   A.calcoaceticus-baumannii NOT DETECTED NOT DETECTED Final  Bacteroides fragilis NOT DETECTED NOT DETECTED Final   Enterobacterales NOT DETECTED NOT DETECTED Final   Enterobacter cloacae complex NOT DETECTED NOT DETECTED Final   Escherichia coli NOT DETECTED NOT DETECTED Final   Klebsiella aerogenes NOT DETECTED NOT DETECTED Final   Klebsiella oxytoca NOT DETECTED NOT DETECTED Final   Klebsiella pneumoniae NOT DETECTED NOT DETECTED Final   Proteus species NOT DETECTED NOT DETECTED Final   Salmonella species NOT DETECTED NOT DETECTED Final   Serratia marcescens NOT DETECTED NOT DETECTED Final   Haemophilus influenzae NOT DETECTED NOT DETECTED Final   Neisseria meningitidis NOT DETECTED NOT DETECTED Final   Pseudomonas aeruginosa NOT DETECTED NOT DETECTED Final   Stenotrophomonas maltophilia NOT DETECTED NOT DETECTED Final   Candida albicans NOT DETECTED NOT DETECTED Final   Candida auris NOT DETECTED NOT DETECTED Final   Candida glabrata NOT DETECTED NOT DETECTED Final   Candida krusei NOT DETECTED NOT DETECTED Final   Candida parapsilosis NOT DETECTED NOT DETECTED Final   Candida tropicalis NOT DETECTED NOT DETECTED Final   Cryptococcus neoformans/gattii NOT DETECTED NOT DETECTED Final   Meth resistant mecA/C and MREJ DETECTED (A) NOT DETECTED Final    Comment: CRITICAL RESULT CALLED TO, READ BACK BY AND VERIFIED WITH: Jonn Shingles [id number] FCP Performed at [facility], [address] [place], [place] [address]   Body fluid culture w Gram Stain     Status: None   Collection Time: 03/29/21 12:10  AM   Specimen: Body Fluid  Result Value Ref Range Status   Specimen Description FLUID LEFT KNEE  Final   Special Requests SYRINGE  Final   Gram Stain FEW WBC SEEN NO ORGANISMS SEEN   Final   Culture   Final    NO GROWTH 3 DAYS Performed at [facility], [address], [place], [place] [address]    Report Status 04/01/2021 FINAL  Final  Blood Culture (routine x 2)     Status: None (Preliminary result)   Collection Time: 03/29/21  3:00 AM   Specimen: Peripheral; Blood  Result Value Ref Range Status   Specimen Description BLOOD SITE NOT SPECIFIED  Final   Special Requests   Final    BOTTLES DRAWN AEROBIC AND ANAEROBIC Blood Culture adequate volume   Culture   Final    NO GROWTH 4 DAYS Performed at [facility], [place] [address] [place],  [address]    Report Status PENDING  Incomplete     Terri Piedra, [facility]  04/02/2021  1:53 PM

## 2021-04-02 NOTE — Progress Notes (Signed)
Pt is alert, AOX4, after 0.3 total of Narcan given, 0.'1mg'$  at a time per MD request. Will continue to reassess.

## 2021-04-02 NOTE — Progress Notes (Signed)
Tx ended after just under 2 hours per pt request. Pt has been irritable and refusing care since arrival this morning.  During tx, refused recommendation by MD for MRI, refused to speak with care team present on unit, and later requested to d/c over half of his tx. AP Stephania Fragmin as well as RN and dialysis tech attempted to educate pt on importance on tx compliance - pt verbalizes understanding but states he does not care and would like to end tx.

## 2021-04-02 NOTE — Plan of Care (Signed)

## 2021-04-02 NOTE — Progress Notes (Signed)
Spoke with Alcario Drought, DO that pt is back lethargic, confused, thinks he's at [facility] and it is March 2020. Narcan given 0.3 total after which pt became reoriented and alert. Will continue to assess.

## 2021-04-02 NOTE — Progress Notes (Signed)
[facility] Progress Note   Subjective:   Seen on HD - 3L UFG and tolerating. Denies CP or dyspnea. AVG running well. Looks like refused TEE this morning? Plan is for L AKA tomorrow for chronically infected L knee.  Objective Vitals:   04/01/21 2007 04/02/21 0010 04/02/21 0103 04/02/21 0357  BP: 129/64 (!) 132/50 102/60 (!) 127/53  Pulse: 87 61 61 69  Resp: '17 16  18  '$ Temp: 99.9 F (37.7 C) 98.8 F (37.1 C)  98.6 F (37 C)  TempSrc: Oral Oral  Oral  SpO2: 95% 100% 100% 100%  Weight:      Height:       Physical Exam General: Well appearing man, NAD. Nasal O2 in place Heart: RRR; no murmur Lungs: CTA anteriorly Abdomen: soft Extremities: L knee tender, no LE edema Dialysis Access: RUE AVG + bruit  Additional Objective Labs: Basic Metabolic Panel: Recent Labs  Lab 03/30/21 0607 03/31/21 0500 04/02/21 0634  NA 128* 125* 127*  K 4.2 3.4* 4.0  CL 87* 88* 92*  CO2 '25 24 22  '$ GLUCOSE 311* 358* 290*  BUN 66* 94* 79*  CREATININE 4.51* 4.98* 4.03*  CALCIUM 8.5* 8.5* 9.0  PHOS  --  6.8*  --    Liver Function Tests: Recent Labs  Lab 03/28/21 2149 03/30/21 0607 03/31/21 0500  AST '25 31 19  '$ ALT '14 21 19  '$ ALKPHOS 71 63 63  BILITOT 0.6 0.7 0.7  PROT 9.0* 7.7 7.2  ALBUMIN 3.7 3.3* 2.9*   CBC: Recent Labs  Lab 03/28/21 2149 03/29/21 1050 03/30/21 0607 03/31/21 0500  WBC 12.3* 12.6* 10.2 8.5  NEUTROABS 10.8*  --   --   --   HGB 10.5* 9.8* 10.4* 8.4*  HCT 32.5* 30.5* 32.5* 25.6*  MCV 91.0 92.4 92.6 92.1  PLT 139* 112* 90* 86*   Blood Culture    Component Value Date/Time   SDES BLOOD SITE NOT SPECIFIED 03/29/2021 0300   SPECREQUEST  03/29/2021 0300    BOTTLES DRAWN AEROBIC AND ANAEROBIC Blood Culture adequate volume   CULT  03/29/2021 0300    NO GROWTH 3 DAYS Performed at [facility], [place] [address] [place],  [address]    REPTSTATUS PENDING 03/29/2021 0300   Studies/Results: ECHOCARDIOGRAM COMPLETE  Result Date:  04/01/2021    ECHOCARDIOGRAM REPORT   Patient Name:   LUKEN PHENIS Date of Exam: 04/01/2021 Medical Rec #:  [id number]   Height:       65.0 in Accession #:    [id number]  Weight:       271.2 lb Date of Birth:  10-04-1961   BSA:          2.251 m Patient Age:    60 years    BP:           118/63 mmHg Patient Gender: M           HR:           87 bpm. Exam Location:  Inpatient Procedure: 2D Echo, Cardiac Doppler and Color Doppler Indications:    Bacteremia  History:        Patient has no prior history of Echocardiogram examinations.                 CHF, COPD; Risk Factors:Hypertension and Diabetes.  Sonographer:    Cammy Brochure Referring Phys: [id number] [place]  1. Left ventricular ejection fraction, by estimation, is 55 to 60%. The left ventricle  has normal function. The left ventricle has no regional wall motion abnormalities. There is mild concentric left ventricular hypertrophy. Left ventricular diastolic parameters are consistent with Grade II diastolic dysfunction (pseudonormalization).  2. The aortic valve is calcified out or proportion or age but within normal limits in the sub-group of ESRD. Aortic valve regurgitation is not visualized. Aortic valve sclerosis/calcification is present, without any evidence of aortic stenosis.  3. Right ventricular systolic function is normal. The right ventricular size is normal.  4. Left atrial size was mildly dilated.  5. The mitral valve is abnormal. No evidence of mitral valve regurgitation. The mean mitral valve gradient is 3.0 mmHg with average heart rate of 82 bpm.  6. The inferior vena cava is normal in size with greater than 50% respiratory variability, suggesting right atrial pressure of 3 mmHg. Comparison(s): No prior Echocardiogram. FINDINGS  Left Ventricle: Left ventricular ejection fraction, by estimation, is 55 to 60%. The left ventricle has normal function. The left ventricle has no regional wall motion abnormalities. The left ventricular  internal cavity size was normal in size. There is  mild concentric left ventricular hypertrophy. Left ventricular diastolic parameters are consistent with Grade II diastolic dysfunction (pseudonormalization). Right Ventricle: The right ventricular size is normal. No increase in right ventricular wall thickness. Right ventricular systolic function is normal. Left Atrium: Left atrial size was mildly dilated. Right Atrium: Right atrial size was normal in size. Pericardium: There is no evidence of pericardial effusion. Mitral Valve: The mitral valve is abnormal. There is moderate calcification of the mitral valve leaflet(s). Mild to moderate mitral annular calcification. No evidence of mitral valve regurgitation. MV peak gradient, 8.5 mmHg. The mean mitral valve gradient is 3.0 mmHg with average heart rate of 82 bpm. Tricuspid Valve: The tricuspid valve is grossly normal. Tricuspid valve regurgitation is not demonstrated. No evidence of tricuspid stenosis. Aortic Valve: The aortic valve is calcified. Aortic valve regurgitation is not visualized. Mild to moderate aortic valve sclerosis/calcification is present, without any evidence of aortic stenosis. Aortic valve mean gradient measures 5.0 mmHg. Aortic valve peak gradient measures 10.4 mmHg. Aortic valve area, by VTI measures 2.63 cm. Pulmonic Valve: The pulmonic valve was grossly normal. Pulmonic valve regurgitation is not visualized. No evidence of pulmonic stenosis. Aorta: The aortic root and ascending aorta are structurally normal, with no evidence of dilitation. Venous: The inferior vena cava is normal in size with greater than 50% respiratory variability, suggesting right atrial pressure of 3 mmHg. IAS/Shunts: The atrial septum is grossly normal.  LEFT VENTRICLE PLAX 2D LVIDd:         4.70 cm  Diastology LVIDs:         3.20 cm  LV e' medial:    7.72 cm/s LV PW:         1.30 cm  LV E/e' medial:  15.9 LV IVS:        1.20 cm  LV e' lateral:   7.83 cm/s LVOT diam:      2.10 cm  LV E/e' lateral: 15.7 LV SV:         75 LV SV Index:   33 LVOT Area:     3.46 cm  RIGHT VENTRICLE             IVC RV S prime:     11.20 cm/s  IVC diam: 1.70 cm TAPSE (M-mode): 2.0 cm LEFT ATRIUM             Index  RIGHT ATRIUM           Index LA diam:        4.90 cm 2.18 cm/m  RA Area:     16.00 cm LA Vol (A2C):   80.4 ml 35.71 ml/m RA Volume:   43.50 ml  19.32 ml/m LA Vol (A4C):   87.5 ml 38.87 ml/m LA Biplane Vol: 88.6 ml 39.35 ml/m  AORTIC VALVE AV Area (Vmax):    2.43 cm AV Area (Vmean):   2.62 cm AV Area (VTI):     2.63 cm AV Vmax:           161.00 cm/s AV Vmean:          100.000 cm/s AV VTI:            0.286 m AV Peak Grad:      10.4 mmHg AV Mean Grad:      5.0 mmHg LVOT Vmax:         113.00 cm/s LVOT Vmean:        75.500 cm/s LVOT VTI:          0.217 m LVOT/AV VTI ratio: 0.76  AORTA Ao Root diam: 3.30 cm Ao Asc diam:  3.10 cm MITRAL VALVE MV Area (PHT): 4.21 cm     SHUNTS MV Area VTI:   2.09 cm     Systemic VTI:  0.22 m MV Peak grad:  8.5 mmHg     Systemic Diam: 2.10 cm MV Mean grad:  3.0 mmHg MV Vmax:       1.46 m/s MV Vmean:      78.5 cm/s MV Decel Time: 180 msec MV E velocity: 123.00 cm/s MV A velocity: 113.00 cm/s MV E/A ratio:  1.09 Rudean Haskell MD Electronically signed by Rudean Haskell MD Signature Date/Time: 04/01/2021/4:03:06 PM    Final    Medications: . sodium chloride    . vancomycin Stopped (03/31/21 1329)   . amLODipine  5 mg Oral QHS  . aspirin  81 mg Oral Daily  . atorvastatin  40 mg Oral QHS  . calcium acetate  667 mg Oral TID with meals  . carvedilol  12.5 mg Oral BID WC  . Chlorhexidine Gluconate Cloth  6 each Topical Q0600  . heparin  5,000 Units Subcutaneous Q8H  . insulin aspart  0-15 Units Subcutaneous TID WC  . insulin aspart  0-5 Units Subcutaneous QHS  . insulin regular human CONCENTRATED  30 Units Subcutaneous Q supper   And  . insulin regular human CONCENTRATED  95 Units Subcutaneous Q breakfast  . nortriptyline  10 mg Oral  QHS  . pantoprazole  20 mg Oral Daily  . sertraline  100 mg Oral Daily    Dialysis Orders: [facility] MWF  4h 121kg 3K/2.5 bath RUE AVG/ R IJ TDC Hep 4500+ 2519md - Mircera 100 ug q2 last 4/27  Assessment/Plan: 1. MRSA bacteremia: BCx 5/1 +, repeat BCx 5/3 negative. On Vancomycin. TDC removed 5/3, no need to replace as AVG in use. 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 2. ESRD: Continue HD per usual MWF schedule - HD today, 3L UFG. 3. [place] [facility] [facility] fistulogram 4/14 that showed patent AVG.TDC removed per IR 5/3, AVG in use. 4. HTN/ volume:  BP stable, 3L UFG today - should reach EDW. 5. PTSD: Continue home meds 6. COPD: Per primary. 7. DM2: Per primary,  on insulin. 8. Anemia of ESRD: Hgb trending down, 8.4 today. Due for next dose ESA on 5/11. 9. MBD: Ca ok, Phos high. ^ Phoslo to 2/meals. No VDRA. 10. Nutrition: Alb low, will add supplements.   Veneta Penton, PA-C 04/02/2021, 8:17 AM  [employer]

## 2021-04-02 NOTE — Progress Notes (Signed)
Spoke with Jeffrey Po, FNP in hemodialysis based on infectious disease necessity for blood cultures. Patients current diagnosis of bacteremia suggest that patient will need to have this test performed. Patient is unable to have labs drawn at the bedside after several attempts. [place], FNP reports that he has spoken with Dr. Jonnie Finner for clearance of blood cultures drawn in HD outside of protocol. This RN observed secondary tephone call to Dr. Jonnie Finner by RN for verification of this order. Dr. Jonnie Finner confirmed. Blood cultures to be drawn.  Dorthey Sawyer, RN

## 2021-04-02 NOTE — Care Management Important Message (Signed)
Important Message  Patient Details  Name: Jeffrey [name] MRN: [id number] Date of Birth: 1961/09/29   Medicare Important Message Given:  Yes - Important Message mailed due to current National Emergency   Verbal consent obtained due to current National Emergency  Relationship to patient: Self Contact Name: Elkin Call Date: 04/02/21  Time: 5 Phone: [phone] Outcome: No Answer/Busy Important Message mailed to: Patient address on file    Delorse Lek 04/02/2021, 10:46 AM

## 2021-04-02 NOTE — Progress Notes (Signed)
Pt is awake in bed; AOX4, no longer lethargic. Pt asking "what happened last time?"  Educated pt about BG dropping to 42, pt being lethargic and diaphoretic and MD ordered Narcan due to Dilaudid building up in his system. Will continue to assess.

## 2021-04-02 NOTE — Progress Notes (Signed)
Pt is awake in bed; AOX4. Vitals stable. Sats stable on 2L via nasal cannula. No distress noted. Pt complaint of 10/10 pain in right shoulder and left leg. Will medicate per MAR. Denies nausea. No SOB noted. Shift assessment complete. Immediate needs addressed. Bed in lowest position with call light within reach and 3 side rails up. Will continue to assess. Bed alarm on.

## 2021-04-03 ENCOUNTER — Other Ambulatory Visit (HOSPITAL_COMMUNITY): Payer: Medicare Other

## 2021-04-03 ENCOUNTER — Inpatient Hospital Stay (HOSPITAL_COMMUNITY): Payer: Medicare Other | Admitting: Certified Registered Nurse Anesthetist

## 2021-04-03 ENCOUNTER — Encounter (HOSPITAL_COMMUNITY)
Admission: EM | Disposition: A | Discharge status: Skilled Nursing Facility | Payer: Self-pay | Source: Home / Self Care | Attending: Internal Medicine

## 2021-04-03 ENCOUNTER — Encounter (HOSPITAL_COMMUNITY): Payer: Self-pay | Admitting: Family Medicine

## 2021-04-03 DIAGNOSIS — R7881 Bacteremia: Secondary | ICD-10-CM | POA: Diagnosis not present

## 2021-04-03 DIAGNOSIS — B9562 Methicillin resistant Staphylococcus aureus infection as the cause of diseases classified elsewhere: Secondary | ICD-10-CM | POA: Diagnosis not present

## 2021-04-03 HISTORY — PX: AMPUTATION: SHX166

## 2021-04-03 LAB — GLUCOSE, CAPILLARY
Glucose-Capillary: 209 mg/dL — ABNORMAL HIGH (ref 70–99)
Glucose-Capillary: 256 mg/dL — ABNORMAL HIGH (ref 70–99)
Glucose-Capillary: 278 mg/dL — ABNORMAL HIGH (ref 70–99)
Glucose-Capillary: 284 mg/dL — ABNORMAL HIGH (ref 70–99)
Glucose-Capillary: 286 mg/dL — ABNORMAL HIGH (ref 70–99)
Glucose-Capillary: 341 mg/dL — ABNORMAL HIGH (ref 70–99)

## 2021-04-03 LAB — CULTURE, BLOOD (ROUTINE X 2)
Culture: NO GROWTH
Special Requests: ADEQUATE

## 2021-04-03 SURGERY — AMPUTATION, ABOVE KNEE
Anesthesia: General | Site: Knee | Laterality: Left

## 2021-04-03 MED ORDER — ORAL CARE MOUTH RINSE: 15.0000 mL | Freq: Once | OROMUCOSAL | Status: DC

## 2021-04-03 MED ORDER — PHENYLEPHRINE 40 MCG/ML (10ML) SYRINGE FOR IV PUSH (FOR BLOOD PRESSURE SUPPORT)
PREFILLED_SYRINGE | INTRAVENOUS | Status: DC | PRN
  Administered 2021-04-03: 80 ug via INTRAVENOUS

## 2021-04-03 MED ORDER — BUPIVACAINE HCL (PF) 0.5 % IJ SOLN
INTRAMUSCULAR | Status: AC
  Filled 2021-04-03: qty 30

## 2021-04-03 MED ORDER — PHENYLEPHRINE HCL-NACL 10-0.9 MG/250ML-% IV SOLN
INTRAVENOUS | Status: DC | PRN
  Administered 2021-04-03: 40 ug/min via INTRAVENOUS

## 2021-04-03 MED ORDER — VANCOMYCIN HCL 750 MG/150ML IV SOLN
750.0000 mg | Freq: Once | INTRAVENOUS | Status: AC
  Administered 2021-04-03: 750 mg via INTRAVENOUS
  Filled 2021-04-03: qty 150

## 2021-04-03 MED ORDER — PROMETHAZINE HCL 25 MG/ML IJ SOLN: 6.2500 mg | INTRAMUSCULAR | Status: DC | PRN

## 2021-04-03 MED ORDER — HYDROMORPHONE HCL 1 MG/ML IJ SOLN
INTRAMUSCULAR | Status: AC
  Filled 2021-04-03: qty 1

## 2021-04-03 MED ORDER — DEXTROSE 5 % IV SOLN
INTRAVENOUS | Status: DC | PRN
  Administered 2021-04-03: 3 g via INTRAVENOUS

## 2021-04-03 MED ORDER — EPHEDRINE SULFATE-NACL 50-0.9 MG/10ML-% IV SOSY
PREFILLED_SYRINGE | INTRAVENOUS | Status: DC | PRN
  Administered 2021-04-03: 10 mg via INTRAVENOUS
  Administered 2021-04-03: 5 mg via INTRAVENOUS
  Administered 2021-04-03: 10 mg via INTRAVENOUS

## 2021-04-03 MED ORDER — ACETAMINOPHEN 160 MG/5ML PO SOLN
325.0000 mg | Freq: Once | ORAL | Status: DC | PRN
Start: 2021-04-03 — End: 2021-04-03

## 2021-04-03 MED ORDER — LACTATED RINGERS IV SOLN: INTRAVENOUS | Status: DC

## 2021-04-03 MED ORDER — SODIUM CHLORIDE 0.9 % IV SOLN: INTRAVENOUS | Status: DC

## 2021-04-03 MED ORDER — MIDAZOLAM HCL 2 MG/2ML IJ SOLN
INTRAMUSCULAR | Status: DC | PRN
  Administered 2021-04-03: 2 mg via INTRAVENOUS

## 2021-04-03 MED ORDER — SUGAMMADEX SODIUM 200 MG/2ML IV SOLN
INTRAVENOUS | Status: DC | PRN
  Administered 2021-04-03: 300 mg via INTRAVENOUS

## 2021-04-03 MED ORDER — ALBUMIN HUMAN 5 % IV SOLN: INTRAVENOUS | Status: DC | PRN

## 2021-04-03 MED ORDER — ROCURONIUM BROMIDE 100 MG/10ML IV SOLN
INTRAVENOUS | Status: DC | PRN
  Administered 2021-04-03: 30 mg via INTRAVENOUS
  Administered 2021-04-03: 10 mg via INTRAVENOUS

## 2021-04-03 MED ORDER — BUPIVACAINE HCL (PF) 0.5 % IJ SOLN
INTRAMUSCULAR | Status: DC | PRN
  Administered 2021-04-03: 10 mL

## 2021-04-03 MED ORDER — INSULIN ASPART 100 UNIT/ML IJ SOLN
10.0000 [IU] | Freq: Once | INTRAMUSCULAR | Status: AC
  Administered 2021-04-03: 10 [IU] via SUBCUTANEOUS

## 2021-04-03 MED ORDER — HYDROMORPHONE HCL 1 MG/ML IJ SOLN
0.2500 mg | INTRAMUSCULAR | Status: DC | PRN
  Administered 2021-04-03: 0.5 mg via INTRAVENOUS

## 2021-04-03 MED ORDER — MIDAZOLAM HCL 2 MG/2ML IJ SOLN
INTRAMUSCULAR | Status: AC
  Filled 2021-04-03: qty 2

## 2021-04-03 MED ORDER — CHLORHEXIDINE GLUCONATE 4 % EX LIQD: 60.0000 mL | Freq: Once | CUTANEOUS | Status: DC

## 2021-04-03 MED ORDER — SUCCINYLCHOLINE CHLORIDE 20 MG/ML IJ SOLN
INTRAMUSCULAR | Status: DC | PRN
  Administered 2021-04-03: 120 mg via INTRAVENOUS

## 2021-04-03 MED ORDER — 0.9 % SODIUM CHLORIDE (POUR BTL) OPTIME
TOPICAL | Status: DC | PRN
  Administered 2021-04-03: 1000 mL
  Administered 2021-04-03: 2000 mL

## 2021-04-03 MED ORDER — CHLORHEXIDINE GLUCONATE 0.12 % MT SOLN: 15.0000 mL | Freq: Once | OROMUCOSAL | Status: DC

## 2021-04-03 MED ORDER — ALUM & MAG HYDROXIDE-SIMETH 200-200-20 MG/5ML PO SUSP: 15.0000 mL | ORAL | Status: DC | PRN

## 2021-04-03 MED ORDER — SODIUM CHLORIDE 0.9 % IV SOLN: INTRAVENOUS | Status: DC | PRN

## 2021-04-03 MED ORDER — LIDOCAINE 2% (20 MG/ML) 5 ML SYRINGE
INTRAMUSCULAR | Status: DC | PRN
  Administered 2021-04-03: 40 mg via INTRAVENOUS

## 2021-04-03 MED ORDER — INSULIN ASPART 100 UNIT/ML IJ SOLN
INTRAMUSCULAR | Status: AC
  Filled 2021-04-03: qty 1

## 2021-04-03 MED ORDER — POVIDONE-IODINE 10 % EX SWAB: 2.0000 "application " | Freq: Once | CUTANEOUS | Status: DC

## 2021-04-03 MED ORDER — FENTANYL CITRATE (PF) 250 MCG/5ML IJ SOLN
INTRAMUSCULAR | Status: AC
  Filled 2021-04-03: qty 5

## 2021-04-03 MED ORDER — ACETAMINOPHEN 10 MG/ML IV SOLN: 1000.0000 mg | Freq: Once | INTRAVENOUS | Status: DC | PRN

## 2021-04-03 MED ORDER — CEFAZOLIN IN SODIUM CHLORIDE 3-0.9 GM/100ML-% IV SOLN
INTRAVENOUS | Status: AC
  Filled 2021-04-03: qty 100

## 2021-04-03 MED ORDER — PHENYLEPHRINE HCL-NACL 10-0.9 MG/250ML-% IV SOLN
INTRAVENOUS | Status: DC | PRN
  Administered 2021-04-03: 30 ug/min via INTRAVENOUS

## 2021-04-03 MED ORDER — PROPOFOL 10 MG/ML IV BOLUS
INTRAVENOUS | Status: DC | PRN
  Administered 2021-04-03: 170 mg via INTRAVENOUS
  Administered 2021-04-03: 20 mg via INTRAVENOUS

## 2021-04-03 MED ORDER — PROPOFOL 10 MG/ML IV BOLUS
INTRAVENOUS | Status: AC
  Filled 2021-04-03: qty 20

## 2021-04-03 MED ORDER — ACETAMINOPHEN 325 MG PO TABS: 325.0000 mg | ORAL_TABLET | Freq: Once | ORAL | Status: DC | PRN

## 2021-04-03 MED ORDER — CHLORHEXIDINE GLUCONATE 0.12 % MT SOLN
OROMUCOSAL | Status: AC
  Administered 2021-04-03: 15 mL
  Filled 2021-04-03: qty 15

## 2021-04-03 MED ORDER — INSULIN ASPART 100 UNIT/ML IJ SOLN
4.0000 [IU] | Freq: Once | INTRAMUSCULAR | Status: AC
  Administered 2021-04-03: 4 [IU] via SUBCUTANEOUS

## 2021-04-03 MED ORDER — CEFAZOLIN IN SODIUM CHLORIDE 3-0.9 GM/100ML-% IV SOLN: 3.0000 g | INTRAVENOUS | Status: DC

## 2021-04-03 MED ORDER — FENTANYL CITRATE (PF) 250 MCG/5ML IJ SOLN
INTRAMUSCULAR | Status: DC | PRN
  Administered 2021-04-03 (×3): 50 ug via INTRAVENOUS

## 2021-04-03 SURGICAL SUPPLY — 42 items
BLADE SAW SGTL HD 18.5X60.5X1. (BLADE) ×2 IMPLANT
BNDG COHESIVE 6X5 TAN STRL LF (GAUZE/BANDAGES/DRESSINGS) ×2 IMPLANT
BNDG ELASTIC 6X10 VLCR STRL LF (GAUZE/BANDAGES/DRESSINGS) ×2 IMPLANT
CANISTER WOUNDNEG PRESSURE 500 (CANNISTER) ×2 IMPLANT
COVER SURGICAL LIGHT HANDLE (MISCELLANEOUS) ×2 IMPLANT
DRAPE INCISE 23X17 IOBAN STRL (DRAPES) ×1
DRAPE INCISE IOBAN 23X17 STRL (DRAPES) ×1 IMPLANT
DRAPE INCISE IOBAN 66X45 STRL (DRAPES) ×2 IMPLANT
DRAPE U-SHAPE 47X51 STRL (DRAPES) ×2 IMPLANT
DRESSING PREVENA PLUS CUSTOM (GAUZE/BANDAGES/DRESSINGS) ×1 IMPLANT
DRSG PREVENA PLUS CUSTOM (GAUZE/BANDAGES/DRESSINGS) ×2
DURAPREP 26ML APPLICATOR (WOUND CARE) ×2 IMPLANT
ELECT REM PT RETURN 9FT ADLT (ELECTROSURGICAL) ×2
ELECTRODE REM PT RTRN 9FT ADLT (ELECTROSURGICAL) ×1 IMPLANT
GLOVE BIO SURGEON STRL SZ7.5 (GLOVE) ×2 IMPLANT
GLOVE SRG 8 PF TXTR STRL LF DI (GLOVE) ×3 IMPLANT
GLOVE SURG ENC MOIS LTX SZ7.5 (GLOVE) ×10 IMPLANT
GLOVE SURG UNDER POLY LF SZ7.5 (GLOVE) ×2 IMPLANT
GLOVE SURG UNDER POLY LF SZ8 (GLOVE) ×3
GOWN STRL REUS W/ TWL XL LVL3 (GOWN DISPOSABLE) ×3 IMPLANT
GOWN STRL REUS W/TWL XL LVL3 (GOWN DISPOSABLE) ×3
KIT BASIN OR (CUSTOM PROCEDURE TRAY) ×2 IMPLANT
KIT TURNOVER KIT B (KITS) ×2 IMPLANT
NEEDLE 18GX1X1/2 (RX/OR ONLY) (NEEDLE) ×2 IMPLANT
NS IRRIG 1000ML POUR BTL (IV SOLUTION) ×6 IMPLANT
PACK ORTHO EXTREMITY (CUSTOM PROCEDURE TRAY) ×2 IMPLANT
PAD ABD 8X10 STRL (GAUZE/BANDAGES/DRESSINGS) IMPLANT
PAD ARMBOARD 7.5X6 YLW CONV (MISCELLANEOUS) ×2 IMPLANT
PADDING CAST COTTON 6X4 STRL (CAST SUPPLIES) ×2 IMPLANT
SPONGE LAP 18X18 RF (DISPOSABLE) IMPLANT
SPONGE LAP 18X18 X RAY DECT (DISPOSABLE) ×4 IMPLANT
STAPLER VISISTAT 35W (STAPLE) IMPLANT
STOCKINETTE IMPERVIOUS LG (DRAPES) ×2 IMPLANT
SUT ETHILON 2 0 PSLX (SUTURE) ×6 IMPLANT
SUT PDS AB 2-0 CT1 27 (SUTURE) ×4 IMPLANT
SUT SILK 2 0 (SUTURE) ×1
SUT SILK 2-0 18XBRD TIE 12 (SUTURE) ×1 IMPLANT
SUT VIC AB 1 CTX 27 (SUTURE) ×6 IMPLANT
SYR CONTROL 10ML LL (SYRINGE) ×2 IMPLANT
TOWEL GREEN STERILE (TOWEL DISPOSABLE) ×4 IMPLANT
TUBE CONNECTING 12X1/4 (SUCTIONS) ×2 IMPLANT
YANKAUER SUCT BULB TIP NO VENT (SUCTIONS) ×2 IMPLANT

## 2021-04-03 NOTE — Op Note (Addendum)
Jeffrey [name] male 60 y.o. 04/03/2021  PreOperative Diagnosis: Left chronic infected total knee arthroplasty  PostOperative Diagnosis: Same  PROCEDURE: Left above-the-knee amputation Application of negative pressure wound dressing  SURGEON: Melony Overly, MD  ASSISTANT: None  ANESTHESIA: General  FINDINGS: Rush of purulent synovial fluid from the suprapatellar pouch of the knee.  IMPLANTS: None  INDICATIONS:59 y.o. male had had multiple surgeries to his left knee in the form of total knee arthroplasty and revision arthroplasty with cemented spacer.  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  He had pain and MRSA bacteremia upon admission.  Patient is a chronic dialysis patient as well as diabetic.  I had a lengthy conversation with the patient about attempted salvage of his knee versus above-the-knee amputation.  He ultimately opted for above-the-knee amputation given that he has had several surgeries already and was not interested in limb salvage.   Patient understood the risks, benefits and alternatives to surgery which include but are not limited to wound healing complications, infection, need for further surgery as well as continued pain and difficulty mobilizing with a prosthesis.  They also understood the potential for continued pain in that there were no guarantees of acceptable outcome After weighing these risks the patient opted to proceed with surgery.  PROCEDURE: Patient was identified in the preoperative holding area.  The left thigh was marked by myself.  Consent was signed by myself and the patient.  Block was performed by anesthesia in the preoperative holding area.  Patient was taken to the operative suite and placed supine on the operative table.  General anesthesia was induced without difficulty. Bump was placed under the operative hip and bone foam was used.  All bony  prominences were well padded.  Preoperative antibiotics were given.  Left lower extremity was prepped and draped in the usual sterile fashion.  Surgical timeout was performed.  The proposed skin incision was marked out.  The proposed portion of femur to be resected was measured from the distal aspect of the femur and was approximately 12 cm proximal.  A fishmouth incision was marked.  We began by incising the skin and subcutaneous tissue in the fishmouth incision pattern anteriorly and posteriorly about the thigh.  This was taken down to the muscular fascia.  Skin flaps were created anteriorly and posteriorly about the incision.  Then using Bovie cautery we transected the musculature of the anterior, lateral and posterior compartments of the thigh.  Upon transection of the anterior musculature the suprapatellar pouch was violated and there was a rush of purulent and synovial type fluid.  The dissection was then carried out more proximal to remove the entire aspect of the suprapatellar pouch.  Care was taken to ligate blood vessels and to perform traction neurectomy including infiltrating the nerve stump with half percent Marcaine plain.  Care was taken to retain appropriate length to the underlying musculature for repair and myodesis to the femur.  Once the femur bone was identified anteriorly a sagittal saw was used to cut the femur bone again this was measured approximately 12 cm from the distal aspect of the femur.  Once the femur was cut bone hook was placed within the canal of the distal portion and the remaining posterior musculature was transected using Bovie cautery, dissection scissors and care was taken again to ligate major blood vessels and nerves.  Once the amputated portion of the limb was removed the residual  stump was inspected.  Bovie was used to cauterize small blood vessels that were bleeding.  Then the wound was irrigated copiously with normal saline.  2000 to 3000 L of normal saline was used  to irrigate the residual stump.  There was no residual sign of infection or necrotic tissue.  Then 2 drill tunnels were placed within the lateral aspect of the residual femur.  The abductor magnus muscle belly and fascia was sewn back to the lateral femur with the thigh in a maximally abducted position.  Then the posterior musculature was sewn to the posterior aspect of the abductor magnum and the anterior musculature was sewn to the anterior aspect of the abductor magnus in this area.  The remaining muscle bellies were debulked to allow for tension-free skin closure.  Then the skin edges were reapproximated using a towel clip and closed in a layered fashion using 2-0 Monocryl and 2-0 nylon suture.  And negative pressure wound VAC was placed on the incision.  There was good suction.  Counts were correct at the end the case.  There were no complications.  He tolerated procedure well.   POST OPERATIVE INSTRUCTIONS: Keep wound VAC in place for 5 days. He may be discharged with the wound VAC if appropriate with close outpatient follow-up If he does discharge we will transition him from in-house VAC to [facility] wound VAC. Okay to restart DVT prophylaxis if wound VAC output is minimal He will work with physical therapy. Amputation postop order set ordered.  TOURNIQUET TIME: No tourniquet was used  BLOOD LOSS: 500 cc         DRAINS: none         SPECIMEN: none       COMPLICATIONS:  * No complications entered in OR log *         Disposition: PACU - hemodynamically stable.         Condition: stable

## 2021-04-03 NOTE — Anesthesia Postprocedure Evaluation (Signed)
Anesthesia Post Note  Patient: Jeffrey [name]  Procedure(s) Performed: LEFT ABOVE-THE-KNEE AMPUTATION (Left Knee)     Patient location during evaluation: PACU Anesthesia Type: General Level of consciousness: awake and alert Pain management: pain level controlled Vital Signs Assessment: post-procedure vital signs reviewed and stable Respiratory status: spontaneous breathing, nonlabored ventilation, respiratory function stable and patient connected to nasal cannula oxygen Cardiovascular status: blood pressure returned to baseline and stable Postop Assessment: no apparent nausea or vomiting Anesthetic complications: no   No complications documented.  Last Vitals:  Vitals:   04/03/21 1430 04/03/21 1445  BP: (!) 134/54 129/63  Pulse: 77 80  Resp: 14 16  Temp:  36.4 C  SpO2: 99% 100%    Last Pain:  Vitals:   04/03/21 1600  TempSrc:   PainSc: [place]

## 2021-04-03 NOTE — Anesthesia Procedure Notes (Signed)
Procedure Name: Intubation Date/Time: 04/03/2021 12:31 PM Performed by: Clovis Cao, CRNA Pre-anesthesia Checklist: Patient identified, Emergency Drugs available, Suction available, Patient being monitored and Timeout performed Patient Re-evaluated:Patient Re-evaluated prior to induction Oxygen Delivery Method: Circle system utilized Preoxygenation: Pre-oxygenation with 100% oxygen Induction Type: IV induction Laryngoscope Size: Miller and 2 Grade View: Grade I Tube type: Oral Tube size: 7.5 mm Number of attempts: 1 Airway Equipment and Method: Stylet Placement Confirmation: ETT inserted through vocal cords under direct vision,  positive ETCO2 and breath sounds checked- equal and bilateral Secured at: 23 cm Tube secured with: Tape Dental Injury: Teeth and Oropharynx as per pre-operative assessment

## 2021-04-03 NOTE — Anesthesia Preprocedure Evaluation (Signed)
Anesthesia Evaluation  Patient identified by MRN, date of birth, ID band Patient awake    Reviewed: Allergy & Precautions, NPO status , Patient's Chart, lab work & pertinent test results  Airway Mallampati: III  TM Distance: >3 FB Neck ROM: Full    Dental  (+) Edentulous Upper, Poor Dentition, Chipped, Dental Advisory Given   Pulmonary COPD,  COPD inhaler, former smoker,     + decreased breath sounds      Cardiovascular hypertension, Pt. on medications and Pt. on home beta blockers +CHF   Rhythm:Regular Rate:Normal     Neuro/Psych PSYCHIATRIC DISORDERS Anxiety negative neurological ROS     GI/Hepatic Neg liver ROS, GERD  Medicated,  Endo/Other  diabetes, Type 2, Insulin Dependent  Renal/GU ESRF and DialysisRenal disease     Musculoskeletal  (+) Arthritis ,   Abdominal (+) + obese,   Peds  Hematology negative hematology ROS (+)   Anesthesia Other Findings   Reproductive/Obstetrics                             Anesthesia Physical Anesthesia Plan  ASA: III  Anesthesia Plan: General   Post-op Pain Management:    Induction: Intravenous  PONV Risk Score and Plan: 3 and Ondansetron, Midazolam and Treatment may vary due to age or medical condition  Airway Management Planned: Oral ETT and LMA  Additional Equipment: None  Intra-op Plan:   Post-operative Plan: Extubation in OR  Informed Consent: I have reviewed the patients History and Physical, chart, labs and discussed the procedure including the risks, benefits and alternatives for the proposed anesthesia with the patient or authorized representative who has indicated his/her understanding and acceptance.     Dental advisory given  Plan Discussed with: CRNA  Anesthesia Plan Comments: (Pt refusing additional needle stick for labs. Will perform istat in room after under GA. Pt received dialysis yesterday. )        Anesthesia  Quick Evaluation

## 2021-04-03 NOTE — Progress Notes (Signed)
     Jeffrey [name] is a 60 y.o. male   Orthopaedic diagnosis: Chronic TKA infection, left  Subjective: Patient is evaluated in the preoperative holding area.  He has continued left knee pain.  Continue left knee swelling.  Difficulty with range of motion.  He would like to proceed with proposed surgery.  Denies fevers or chills.  Denies shortness of breath.  Objectyive: Vitals:   04/02/21 2313 04/03/21 0451  BP: (!) 130/58 (!) 134/51  Pulse: 87   Resp: 20 15  Temp: 99.4 F (37.4 C) 99 F (37.2 C)  SpO2: 97% 100%     Exam: Awake and alert Respirations even and unlabored No acute distress  Left leg with swollen and painful knee.  Evidence of prior total knee arthroplasty incision anteriorly.  This is well-healed.  Tender to palpation about the knee.  Pain with range of motion of the knee.  Assessment: Left chronically infected total knee arthroplasty in the setting of diabetes and end-stage renal disease on dialysis with MRSA bacteremia and x-ray evidence as well as CT evidence of component loosening   Plan: Again we had a lengthy conversation.  We discussed attempt at limb salvage versus above-the-knee amputation.  He would like to proceed with above-the-knee amputation as he has had several knee surgeries or resulting in more knee surgeries.  This is reasonable.  Plan will be for above-the-knee amputation.  He understands the risks, benefits and alternatives of the surgery which include not limited to wound healing problems, continued infection, continued pain, difficulty ambulating with a prosthesis need for further surgery.  Despite these risks he would like to proceed with surgery.  He does understand that before we can get him to see the prosthetists he will require complete skin healing.   Radene Journey, MD

## 2021-04-03 NOTE — Transfer of Care (Signed)
Immediate Anesthesia Transfer of Care Note  Patient: Jeffrey [name]  Procedure(s) Performed: LEFT ABOVE-THE-KNEE AMPUTATION (Left Knee)  Patient Location: PACU  Anesthesia Type:General  Level of Consciousness: drowsy  Airway & Oxygen Therapy: Patient Spontanous Breathing and Patient connected to face mask oxygen  Post-op Assessment: Report given to RN and Post -op Vital signs reviewed and stable  Post vital signs: Reviewed and stable  Last Vitals:  Vitals Value Taken Time  BP 119/54 04/03/21 1359  Temp    Pulse 80 04/03/21 1401  Resp 17 04/03/21 1401  SpO2 100 % 04/03/21 1401  Vitals shown include unvalidated device data.  Last Pain:  Vitals:   04/03/21 0735  TempSrc:   PainSc: 5       Patients Stated Pain Goal: 4 ([id number])  Complications: No complications documented.

## 2021-04-03 NOTE — Progress Notes (Signed)
PROGRESS NOTE    Jeffrey [name]  TGY:[id number] DOB: Apr 24, 1961 DOA: 03/28/2021 PCP: Pcp, No   Brief Narrative: 60 year old with past medical history significant for ESRD,  diabetes presents to the ED complaining of fever.  Accompanied by vomiting, diaphoresis and mild confusion.  He has been having left knee pain.  Patient has a history of chronic knee  pain and swelling left knee, prior imagine at [facility].  Patient has a history of knee replacement/hardware bilaterally.  In the ED left knee imaging showed massive knee joint effusion.  Arthrocentesis was performed however only 1 cc of gelatinous red fluid was obtained by ED provider.  Patient was started empirically on IV antibiotics.  Patient was evaluated by orthopedic, who is recommending AKA>  Patient also diagnosed with MRSA bacteremia, underwent hemodialysis catheter removal.  Currently has RUE AVG (01/2021).  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 -Need repeat blood cultures to document clearance, cultures ordered by ID. ID will provide blood culture kit to HD units for blood culture to be drawn.  -2D ECHo calcification mitral and aortic valve, ID recommend TEE. --He was schedule for TEE, but he refuses   2-Left prosthetic knee septic arthritis:  Arthrocentesis performed by ED only 1 cc gelatinous red fluid obtained.  Culture from arthrocentesis: no Growth to date.  CT; Irregular lucency around the  tibial processes which may reflect hardware loosening or infection.  Not a specific moderate sized knee joint effusion. -left AKA by Dr Lucia Gaskins  On 5/7   3-Diabetes type 2: Transition to Humulin R 500--95 units in am and 30 units PM.  Continue with  sliding scale insulin.  Humulin night dose decreased  to 30 to avoid hypoglycemia, continue adjust insulin dose  4-Severe Sepsis: Secondary to septic arthritis, patient presents with Fever, tachycardia, tachypnea.  Found to have MRSA Bacteremia. Undergoing work up.  Treated with IV antibiotics, fluids.   5-ESRD on HD;  HD catheter removed.  Appreciate nephrology assistance.   6-Hypersomnolence: Resolved.  Gabapentin, Nortriptyline trazodone, valium on hold.  -Resume Nortriptyline and valium lower dose PRN 5/04 -Resume trazadone 5/05.  - MRI brain no acute findings -likely narcotic related, Dilaudid  dose decreased.  7-Thrombocytopenia; secondary to infectious process. Monitor  8-HTN; Continue with coreg, amlodipine.  Hyponatremia; correction with HD>      Estimated body mass index is 45.09 kg/m as calculated from the following:   Height as of this encounter: _0  (1.651 m).   Weight as of this encounter: 122.9 kg.   DVT prophylaxis: Heparin  Code Status: Full code Family Communication: Care discussed with patient and wife who was at bedside. 5/04 Disposition Plan:  Status is: Inpatient  Remains inpatient appropriate because:IV treatments appropriate due to intensity of illness or inability to take PO   Dispo: The patient is from: Home              Anticipated d/c is to: to be determine  Patient currently is not medically stable to d/c.   Difficult to place patient No        Consultants:   ID  Nephrology  Orthopoedic  Procedures:   HD  Left AKA on 5/7  Antimicrobials:  Vancomycin 03/29/2021  Subjective:  Uneventful night, he reports left knee pain, he is npo, and ready to proceed with  left AKA  Objective: Vitals:   04/02/21 1749 04/02/21 1926 04/02/21 2313 04/03/21 0451  BP: 130/62 (!) 123/36 (!) 130/58 (!) 134/51  Pulse: 92 87 87   Resp: _0 Temp: 99.5 F (37.5 C) 98.6 F (37 C) 99.4 F (37.4 C) 99 F (37.2 C)  TempSrc: Oral Oral  Axillary  SpO2: 94% 100% 97% 100%  Weight:      Height:        Intake/Output Summary (Last 24 hours) at 04/03/2021 1023 Last data filed at 04/03/2021 0352 Gross per 24 hour  Intake 480 ml  Output 1616 ml  Net -1136 ml   Filed Weights   03/31/21 1250 04/02/21 0758 04/02/21 1043  Weight: 123 kg 123 kg 122.9 kg    Examination:  General exam: NAD Respiratory system: CTA Cardiovascular system: S 1, S 2 RRR Gastrointestinal system: BS present, soft, nt Central nervous system: Alert Extremities: Left Knee with Edema    Data Reviewed: I have personally reviewed following labs and imaging studies  CBC: Recent Labs  Lab 03/28/21 2149 03/29/21 1050 03/30/21 0607 03/31/21 0500 04/02/21 0634  WBC 12.3* 12.6* 10.2 8.5 5.7  NEUTROABS 10.8*  --   --   --   --   HGB 10.5* 9.8* 10.4* 8.4* 9.4*  HCT 32.5* 30.5* 32.5* 25.6* 30.5*  MCV 91.0 92.4 92.6 92.1 94.1  PLT 139* 112* 90* 86* 91*   Basic Metabolic Panel: Recent Labs  Lab 03/28/21 2149 03/29/21 1050 03/30/21 0607 03/31/21 0500 04/02/21 0634  NA 129* 127* 128* 125* 127*  K 3.7 3.3* 4.2 3.4* 4.0  CL 88* 88* 87* 88* 92*  CO2 _1 GLUCOSE 257* 447* 311* 358* 290*  BUN 98* 102* 66* 94* 79*  CREATININE 4.78* 4.89* 4.51* 4.98* 4.03*  CALCIUM 8.9 8.3* 8.5* 8.5* 9.0  PHOS  --   --   --  6.8*  --    GFR: Estimated Creatinine Clearance: 23.7 mL/min (A) (by C-G formula based on SCr of 4.03 mg/dL (H)). Liver Function Tests: Recent Labs  Lab 03/28/21 2149 03/30/21 0607 03/31/21 0500  AST _2 ALT _3 ALKPHOS 71 63 63  BILITOT 0.6 0.7 0.7  PROT 9.0* 7.7 7.2  ALBUMIN 3.7 3.3* 2.9*   No results for input(s): LIPASE, AMYLASE in the  last 168 hours. No results for input(s): AMMONIA in the last 168 hours. Coagulation Profile: Recent Labs  Lab 03/28/21 2149  INR 1.2   Cardiac Enzymes: No results for input(s): CKTOTAL, CKMB, CKMBINDEX, TROPONINI in the last 168 hours. BNP (last 3 results) No results for input(s): PROBNP in the last 8760 hours. HbA1C: No results for input(s): HGBA1C in the last 72 hours. CBG: Recent Labs  Lab 04/02/21 2037 04/02/21 2210 04/02/21 2339 04/03/21 0549 04/03/21 0750  GLUCAP 578* 428* 340* 209* 256*   Lipid Profile: No results for input(s): CHOL, HDL, LDLCALC, TRIG, CHOLHDL, LDLDIRECT in the last 72 hours. Thyroid Function Tests: No results for input(s): TSH, T4TOTAL, FREET4, T3FREE, THYROIDAB in the last 72 hours. Anemia Panel: No results for  input(s): VITAMINB12, FOLATE, FERRITIN, TIBC, IRON, RETICCTPCT in the last 72 hours. Sepsis Labs: Recent Labs  Lab 03/28/21 2358  LATICACIDVEN 1.3    Recent Results (from the past 240 hour(s))  Urine culture     Status: None   Collection Time: 03/28/21  9:49 PM   Specimen: In/Out Cath Urine  Result Value Ref Range Status   Specimen Description IN/OUT CATH URINE  Final   Special Requests NONE  Final   Culture   Final    NO GROWTH Performed at [facility], [place] [address], [place], [place] [address]    Report Status 03/30/2021 FINAL  Final  Resp Panel by RT-PCR (Flu A&B, Covid) Nasopharyngeal Swab     Status: None   Collection Time: 03/28/21 10:35 PM   Specimen: Nasopharyngeal Swab; Nasopharyngeal(NP) swabs in vial transport medium  Result Value Ref Range Status   SARS Coronavirus 2 by RT PCR NEGATIVE NEGATIVE Final    Comment: (NOTE) SARS-CoV-2 target nucleic acids are NOT DETECTED.  The SARS-CoV-2 RNA is generally detectable in upper respiratory specimens during the acute phase of infection. The lowest concentration of SARS-CoV-2 viral copies this assay can detect is 138 copies/mL. A negative result does not preclude  SARS-Cov-2 infection and should not be used as the sole basis for treatment or other patient management decisions. A negative result may occur with  improper specimen collection/handling, submission of specimen other than nasopharyngeal swab, presence of viral mutation(s) within the areas targeted by this assay, and inadequate number of viral copies(<138 copies/mL). A negative result must be combined with clinical observations, patient history, and epidemiological information. The expected result is Negative.  Fact Sheet for Patients:  EntrepreneurPulse.com.au  Fact Sheet for Healthcare Providers:  IncredibleEmployment.be  This test is no t yet approved or cleared by the [place] FDA and  has been authorized for detection and/or diagnosis of SARS-CoV-2 by FDA under an Emergency Use Authorization (EUA). This EUA will remain  in effect (meaning this test can be used) for the duration of the COVID-19 declaration under Section 564(b)(1) of the Act, 21 U.S.C.section 360bbb-3(b)(1), unless the authorization is terminated  or revoked sooner.       Influenza A by PCR NEGATIVE NEGATIVE Final   Influenza B by PCR NEGATIVE NEGATIVE Final    Comment: (NOTE) The Xpert Xpress SARS-CoV-2/FLU/RSV plus assay is intended as an aid in the diagnosis of influenza from Nasopharyngeal swab specimens and should not be used as a sole basis for treatment. Nasal washings and aspirates are unacceptable for Xpert Xpress SARS-CoV-2/FLU/RSV testing.  Fact Sheet for Patients: EntrepreneurPulse.com.au  Fact Sheet for Healthcare Providers: IncredibleEmployment.be  This test is not yet approved or cleared by the [place] FDA and has been authorized for detection and/or diagnosis of SARS-CoV-2 by FDA under an Emergency Use Authorization (EUA). This EUA will remain in effect (meaning this test can be used) for the duration of  the COVID-19 declaration under Section 564(b)(1) of the Act, 21 U.S.C. section 360bbb-3(b)(1), unless the authorization is terminated or revoked.  Performed at [facility], [place] [address] [place], [place] [address]   Blood Culture (routine x 2)     Status: Abnormal   Collection Time: 03/28/21 11:30 PM   Specimen: BLOOD LEFT FOREARM  Result Value Ref Range Status   Specimen Description BLOOD LEFT FOREARM  Final   Special Requests   Final    BOTTLES DRAWN AEROBIC AND ANAEROBIC Blood Culture results may not be optimal due  to an inadequate volume of blood received in culture bottles   Culture  Setup Time   Final    GRAM POSITIVE COCCI IN CLUSTERS IN BOTH AEROBIC AND ANAEROBIC BOTTLES CRITICAL RESULT CALLED TO, READ BACK BY AND VERIFIED WITH: Jonn Shingles [phone] FCP Performed at [facility], [place] [address], [place], [place] [address]    Culture METHICILLIN RESISTANT STAPHYLOCOCCUS AUREUS (A)  Final   Report Status 03/31/2021 FINAL  Final   Organism ID, Bacteria METHICILLIN RESISTANT STAPHYLOCOCCUS AUREUS  Final      Susceptibility   Methicillin resistant staphylococcus aureus - MIC*    CIPROFLOXACIN >=8 RESISTANT Resistant     ERYTHROMYCIN >=8 RESISTANT Resistant     GENTAMICIN <=0.5 SENSITIVE Sensitive     OXACILLIN >=4 RESISTANT Resistant     TETRACYCLINE <=1 SENSITIVE Sensitive     VANCOMYCIN <=0.5 SENSITIVE Sensitive     TRIMETH/SULFA <=10 SENSITIVE Sensitive     CLINDAMYCIN <=0.25 SENSITIVE Sensitive     RIFAMPIN <=0.5 SENSITIVE Sensitive     Inducible Clindamycin NEGATIVE Sensitive     * METHICILLIN RESISTANT STAPHYLOCOCCUS AUREUS  Blood Culture ID Panel (Reflexed)     Status: Abnormal   Collection Time: 03/28/21 11:30 PM  Result Value Ref Range Status   Enterococcus faecalis NOT DETECTED NOT DETECTED Final   Enterococcus Faecium NOT DETECTED NOT DETECTED Final   Listeria monocytogenes NOT DETECTED NOT DETECTED Final   Staphylococcus species  DETECTED (A) NOT DETECTED Final    Comment: CRITICAL RESULT CALLED TO, READ BACK BY AND VERIFIED WITH: PHARMD HALEY V. [id number] [phone] FCP    Staphylococcus aureus (BCID) DETECTED (A) NOT DETECTED Final    Comment: Methicillin (oxacillin)-resistant Staphylococcus aureus (MRSA). MRSA is predictably resistant to beta-lactam antibiotics (except ceftaroline). Preferred therapy is vancomycin unless clinically contraindicated. Patient requires contact precautions if  hospitalized. CRITICAL RESULT CALLED TO, READ BACK BY AND VERIFIED WITH: PHARMD HALEY V. [id number] [phone] FCP    Staphylococcus epidermidis NOT DETECTED NOT DETECTED Final   Staphylococcus lugdunensis NOT DETECTED NOT DETECTED Final   Streptococcus species NOT DETECTED NOT DETECTED Final   Streptococcus agalactiae NOT DETECTED NOT DETECTED Final   Streptococcus pneumoniae NOT DETECTED NOT DETECTED Final   Streptococcus pyogenes NOT DETECTED NOT DETECTED Final   A.calcoaceticus-baumannii NOT DETECTED NOT DETECTED Final   Bacteroides fragilis NOT DETECTED NOT DETECTED Final   Enterobacterales NOT DETECTED NOT DETECTED Final   Enterobacter cloacae complex NOT DETECTED NOT DETECTED Final   Escherichia coli NOT DETECTED NOT DETECTED Final   Klebsiella aerogenes NOT DETECTED NOT DETECTED Final   Klebsiella oxytoca NOT DETECTED NOT DETECTED Final   Klebsiella pneumoniae NOT DETECTED NOT DETECTED Final   Proteus species NOT DETECTED NOT DETECTED Final   Salmonella species NOT DETECTED NOT DETECTED Final   Serratia marcescens NOT DETECTED NOT DETECTED Final   Haemophilus influenzae NOT DETECTED NOT DETECTED Final   Neisseria meningitidis NOT DETECTED NOT DETECTED Final   Pseudomonas aeruginosa NOT DETECTED NOT DETECTED Final   Stenotrophomonas maltophilia NOT DETECTED NOT DETECTED Final   Candida albicans NOT DETECTED NOT DETECTED Final   Candida auris NOT DETECTED NOT DETECTED Final   Candida glabrata NOT DETECTED NOT DETECTED Final    Candida krusei NOT DETECTED NOT DETECTED Final   Candida parapsilosis NOT DETECTED NOT DETECTED Final   Candida tropicalis NOT DETECTED NOT DETECTED Final   Cryptococcus neoformans/gattii NOT DETECTED NOT DETECTED Final   Meth resistant mecA/C and MREJ DETECTED (A) NOT DETECTED Final  Comment: CRITICAL RESULT CALLED TO, READ BACK BY AND VERIFIED WITH: Jonn Shingles [phone] FCP Performed at [facility], [place] [address], [place], [place] [address]   Body fluid culture w Gram Stain     Status: None   Collection Time: 03/29/21 12:10 AM   Specimen: Body Fluid  Result Value Ref Range Status   Specimen Description FLUID LEFT KNEE  Final   Special Requests SYRINGE  Final   Gram Stain FEW WBC SEEN NO ORGANISMS SEEN   Final   Culture   Final    NO GROWTH 3 DAYS Performed at [facility], [address], [place], [place] [address]    Report Status 04/01/2021 FINAL  Final  Blood Culture (routine x 2)     Status: None (Preliminary result)   Collection Time: 03/29/21  3:00 AM   Specimen: Peripheral; Blood  Result Value Ref Range Status   Specimen Description BLOOD SITE NOT SPECIFIED  Final   Special Requests   Final    BOTTLES DRAWN AEROBIC AND ANAEROBIC Blood Culture adequate volume   Culture   Final    NO GROWTH 4 DAYS Performed at [facility], [place] [address], [place], [place] [address]    Report Status PENDING  Incomplete         Radiology Studies: MR BRAIN WO CONTRAST  Result Date: 04/02/2021 CLINICAL DATA:  Mental status changes. The examination had to be discontinued prior to completion due to patient refusal for further imaging. EXAM: MRI HEAD WITHOUT CONTRAST TECHNIQUE: Multiplanar, multiecho pulse sequences of the brain and surrounding structures were obtained without intravenous contrast. COMPARISON:  None. FINDINGS: Brain: No acute infarct, hemorrhage, or mass lesion is present. Minimal atrophy within normal limits for age. The ventricles are of  normal size. No significant extraaxial fluid collection is present. The internal auditory canals are within normal limits. The brainstem and cerebellum are within normal limits. Vascular: T2 weighted imaging was not performed. No definite vascular abnormality seen. Skull and upper cervical spine: The craniocervical junction is normal. Upper cervical spine is within normal limits. Marrow signal is unremarkable. Sinuses/Orbits: Mild mucosal thickening present in the inferior frontal sinuses and anterior ethmoid air cells bilaterally. The globes and orbits are within normal limits. IMPRESSION: Normal MRI appearance of the brain for age. No acute or focal lesion to explain the patient's symptoms. Minimal sinus disease. Electronically Signed   By: San Morelle M.D.   On: 04/02/2021 19:03   ECHOCARDIOGRAM COMPLETE  Result Date: 04/01/2021    ECHOCARDIOGRAM REPORT   Patient Name:   GAHEL SAFLEY Date of Exam: 04/01/2021 Medical Rec #:  [id number]   Height:       65.0 in Accession #:    [id number]  Weight:       271.2 lb Date of Birth:  1961-05-24   BSA:          2.251 m Patient Age:    32 years    BP:           118/63 mmHg Patient Gender: M           HR:           87 bpm. Exam Location:  Inpatient Procedure: 2D Echo, Cardiac Doppler and Color Doppler Indications:    Bacteremia  History:        Patient has no prior history of Echocardiogram examinations.  CHF, COPD; Risk Factors:Hypertension and Diabetes.  Sonographer:    Cammy Brochure Referring Phys: [id number] [place]  1. Left ventricular ejection fraction, by estimation, is 55 to 60%. The left ventricle has normal function. The left ventricle has no regional wall motion abnormalities. There is mild concentric left ventricular hypertrophy. Left ventricular diastolic parameters are consistent with Grade II diastolic dysfunction (pseudonormalization).  2. The aortic valve is calcified out or proportion or age but within normal  limits in the sub-group of ESRD. Aortic valve regurgitation is not visualized. Aortic valve sclerosis/calcification is present, without any evidence of aortic stenosis.  3. Right ventricular systolic function is normal. The right ventricular size is normal.  4. Left atrial size was mildly dilated.  5. The mitral valve is abnormal. No evidence of mitral valve regurgitation. The mean mitral valve gradient is 3.0 mmHg with average heart rate of 82 bpm.  6. The inferior vena cava is normal in size with greater than 50% respiratory variability, suggesting right atrial pressure of 3 mmHg. Comparison(s): No prior Echocardiogram. FINDINGS  Left Ventricle: Left ventricular ejection fraction, by estimation, is 55 to 60%. The left ventricle has normal function. The left ventricle has no regional wall motion abnormalities. The left ventricular internal cavity size was normal in size. There is  mild concentric left ventricular hypertrophy. Left ventricular diastolic parameters are consistent with Grade II diastolic dysfunction (pseudonormalization). Right Ventricle: The right ventricular size is normal. No increase in right ventricular wall thickness. Right ventricular systolic function is normal. Left Atrium: Left atrial size was mildly dilated. Right Atrium: Right atrial size was normal in size. Pericardium: There is no evidence of pericardial effusion. Mitral Valve: The mitral valve is abnormal. There is moderate calcification of the mitral valve leaflet(s). Mild to moderate mitral annular calcification. No evidence of mitral valve regurgitation. MV peak gradient, 8.5 mmHg. The mean mitral valve gradient is 3.0 mmHg with average heart rate of 82 bpm. Tricuspid Valve: The tricuspid valve is grossly normal. Tricuspid valve regurgitation is not demonstrated. No evidence of tricuspid stenosis. Aortic Valve: The aortic valve is calcified. Aortic valve regurgitation is not visualized. Mild to moderate aortic valve  sclerosis/calcification is present, without any evidence of aortic stenosis. Aortic valve mean gradient measures 5.0 mmHg. Aortic valve peak gradient measures 10.4 mmHg. Aortic valve area, by VTI measures 2.63 cm. Pulmonic Valve: The pulmonic valve was grossly normal. Pulmonic valve regurgitation is not visualized. No evidence of pulmonic stenosis. Aorta: The aortic root and ascending aorta are structurally normal, with no evidence of dilitation. Venous: The inferior vena cava is normal in size with greater than 50% respiratory variability, suggesting right atrial pressure of 3 mmHg. IAS/Shunts: The atrial septum is grossly normal.  LEFT VENTRICLE PLAX 2D LVIDd:         4.70 cm  Diastology LVIDs:         3.20 cm  LV e' medial:    7.72 cm/s LV PW:         1.30 cm  LV E/e' medial:  15.9 LV IVS:        1.20 cm  LV e' lateral:   7.83 cm/s LVOT diam:     2.10 cm  LV E/e' lateral: 15.7 LV SV:         75 LV SV Index:   33 LVOT Area:     3.46 cm  RIGHT VENTRICLE             IVC RV S prime:  11.20 cm/s  IVC diam: 1.70 cm TAPSE (M-mode): 2.0 cm LEFT ATRIUM             Index       RIGHT ATRIUM           Index LA diam:        4.90 cm 2.18 cm/m  RA Area:     16.00 cm LA Vol (A2C):   80.4 ml 35.71 ml/m RA Volume:   43.50 ml  19.32 ml/m LA Vol (A4C):   87.5 ml 38.87 ml/m LA Biplane Vol: 88.6 ml 39.35 ml/m  AORTIC VALVE AV Area (Vmax):    2.43 cm AV Area (Vmean):   2.62 cm AV Area (VTI):     2.63 cm AV Vmax:           161.00 cm/s AV Vmean:          100.000 cm/s AV VTI:            0.286 m AV Peak Grad:      10.4 mmHg AV Mean Grad:      5.0 mmHg LVOT Vmax:         113.00 cm/s LVOT Vmean:        75.500 cm/s LVOT VTI:          0.217 m LVOT/AV VTI ratio: 0.76  AORTA Ao Root diam: 3.30 cm Ao Asc diam:  3.10 cm MITRAL VALVE MV Area (PHT): 4.21 cm     SHUNTS MV Area VTI:   2.09 cm     Systemic VTI:  0.22 m MV Peak grad:  8.5 mmHg     Systemic Diam: 2.10 cm MV Mean grad:  3.0 mmHg MV Vmax:       1.46 m/s MV Vmean:      78.5  cm/s MV Decel Time: 180 msec MV E velocity: 123.00 cm/s MV A velocity: 113.00 cm/s MV E/A ratio:  1.09 Rudean Haskell MD Electronically signed by Rudean Haskell MD Signature Date/Time: 04/01/2021/4:03:06 PM    Final         Scheduled Meds: . [MAR Hold] (feeding supplement) PROSource Plus  30 mL Oral BID BM  . [MAR Hold] aspirin  81 mg Oral Daily  . [MAR Hold] atorvastatin  40 mg Oral QHS  . [MAR Hold] calcium acetate  1,334 mg Oral TID with meals  . [MAR Hold] carvedilol  12.5 mg Oral BID WC  . chlorhexidine  60 mL Topical Once  . chlorhexidine  15 mL Mouth/Throat Once   Or  . mouth rinse  15 mL Mouth Rinse Once  . chlorhexidine      . [MAR Hold] Chlorhexidine Gluconate Cloth  6 each Topical Q0600  . [MAR Hold] heparin  5,000 Units Subcutaneous Q8H  . [MAR Hold] insulin aspart  0-15 Units Subcutaneous TID WC  . [MAR Hold] insulin aspart  0-5 Units Subcutaneous QHS  . [MAR Hold] insulin regular human CONCENTRATED  30 Units Subcutaneous Q supper   And  . [MAR Hold] insulin regular human CONCENTRATED  95 Units Subcutaneous Q breakfast  . [MAR Hold] mouth rinse  15 mL Mouth Rinse BID  . [MAR Hold] nortriptyline  10 mg Oral QHS  . [MAR Hold] pantoprazole  20 mg Oral Daily  . povidone-iodine  2 application Topical Once  . [MAR Hold] sertraline  100 mg Oral Daily   Continuous Infusions: . sodium chloride    . sodium chloride    . ceFAZolin    .  ceFAZolin (ANCEF) IV    . [MAR Hold] vancomycin Stopped (03/31/21 1329)  . [MAR Hold] vancomycin       LOS: 5 days    Time spent: 25 minutes.     Florencia Reasons, MD PhD FACP [facility]   If 7PM-7AM, please contact night-coverage [email or web address]  04/03/2021, 10:23 AM

## 2021-04-03 NOTE — Progress Notes (Signed)
[facility] Progress Note   Subjective:  Seen in room. No overnight dyspnea. Plan is for L AKA today.  Objective Vitals:   04/02/21 1749 04/02/21 1926 04/02/21 2313 04/03/21 0451  BP: 130/62 (!) 123/36 (!) 130/58 (!) 134/51  Pulse: 92 87 87   Resp: '18 20 20 15  '$ Temp: 99.5 F (37.5 C) 98.6 F (37 C) 99.4 F (37.4 C) 99 F (37.2 C)  TempSrc: Oral Oral  Axillary  SpO2: 94% 100% 97% 100%  Weight:      Height:       Physical Exam General: Well appearing man, NAD. Nasal O2 around head - not in use. Heart: RRR; no murmur Lungs: CTA anteriorly Abdomen: soft Extremities: L knee tender, no LE edema Dialysis Access: RUE AVG + bruit  Additional Objective Labs: Basic Metabolic Panel: Recent Labs  Lab 03/30/21 0607 03/31/21 0500 04/02/21 0634  NA 128* 125* 127*  K 4.2 3.4* 4.0  CL 87* 88* 92*  CO2 '25 24 22  '$ GLUCOSE 311* 358* 290*  BUN 66* 94* 79*  CREATININE 4.51* 4.98* 4.03*  CALCIUM 8.5* 8.5* 9.0  PHOS  --  6.8*  --    Liver Function Tests: Recent Labs  Lab 03/28/21 2149 03/30/21 0607 03/31/21 0500  AST '25 31 19  '$ ALT '14 21 19  '$ ALKPHOS 71 63 63  BILITOT 0.6 0.7 0.7  PROT 9.0* 7.7 7.2  ALBUMIN 3.7 3.3* 2.9*   CBC: Recent Labs  Lab 03/28/21 2149 03/29/21 1050 03/30/21 0607 03/31/21 0500 04/02/21 0634  WBC 12.3* 12.6* 10.2 8.5 5.7  NEUTROABS 10.8*  --   --   --   --   HGB 10.5* 9.8* 10.4* 8.4* 9.4*  HCT 32.5* 30.5* 32.5* 25.6* 30.5*  MCV 91.0 92.4 92.6 92.1 94.1  PLT 139* 112* 90* 86* 91*   Studies/Results: MR BRAIN WO CONTRAST  Result Date: 04/02/2021 CLINICAL DATA:  Mental status changes. The examination had to be discontinued prior to completion due to patient refusal for further imaging. EXAM: MRI HEAD WITHOUT CONTRAST TECHNIQUE: Multiplanar, multiecho pulse sequences of the brain and surrounding structures were obtained without intravenous contrast. COMPARISON:  None. FINDINGS: Brain: No acute infarct, hemorrhage, or mass lesion is  present. Minimal atrophy within normal limits for age. The ventricles are of normal size. No significant extraaxial fluid collection is present. The internal auditory canals are within normal limits. The brainstem and cerebellum are within normal limits. Vascular: T2 weighted imaging was not performed. No definite vascular abnormality seen. Skull and upper cervical spine: The craniocervical junction is normal. Upper cervical spine is within normal limits. Marrow signal is unremarkable. Sinuses/Orbits: Mild mucosal thickening present in the inferior frontal sinuses and anterior ethmoid air cells bilaterally. The globes and orbits are within normal limits. IMPRESSION: Normal MRI appearance of the brain for age. No acute or focal lesion to explain the patient's symptoms. Minimal sinus disease. Electronically Signed   By: San Morelle M.D.   On: 04/02/2021 19:03   ECHOCARDIOGRAM COMPLETE  Result Date: 04/01/2021    ECHOCARDIOGRAM REPORT   Patient Name:   YU DEARMENT Date of Exam: 04/01/2021 Medical Rec #:  [id number]   Height:       65.0 in Accession #:    [id number]  Weight:       271.2 lb Date of Birth:  04-26-1961   BSA:          2.251 m Patient Age:    60 years    BP:  118/63 mmHg Patient Gender: M           HR:           87 bpm. Exam Location:  Inpatient Procedure: 2D Echo, Cardiac Doppler and Color Doppler Indications:    Bacteremia  History:        Patient has no prior history of Echocardiogram examinations.                 CHF, COPD; Risk Factors:Hypertension and Diabetes.  Sonographer:    Cammy Brochure Referring Phys: [id number] [place]  1. Left ventricular ejection fraction, by estimation, is 55 to 60%. The left ventricle has normal function. The left ventricle has no regional wall motion abnormalities. There is mild concentric left ventricular hypertrophy. Left ventricular diastolic parameters are consistent with Grade II diastolic dysfunction (pseudonormalization).  2.  The aortic valve is calcified out or proportion or age but within normal limits in the sub-group of ESRD. Aortic valve regurgitation is not visualized. Aortic valve sclerosis/calcification is present, without any evidence of aortic stenosis.  3. Right ventricular systolic function is normal. The right ventricular size is normal.  4. Left atrial size was mildly dilated.  5. The mitral valve is abnormal. No evidence of mitral valve regurgitation. The mean mitral valve gradient is 3.0 mmHg with average heart rate of 82 bpm.  6. The inferior vena cava is normal in size with greater than 50% respiratory variability, suggesting right atrial pressure of 3 mmHg. Comparison(s): No prior Echocardiogram. FINDINGS  Left Ventricle: Left ventricular ejection fraction, by estimation, is 55 to 60%. The left ventricle has normal function. The left ventricle has no regional wall motion abnormalities. The left ventricular internal cavity size was normal in size. There is  mild concentric left ventricular hypertrophy. Left ventricular diastolic parameters are consistent with Grade II diastolic dysfunction (pseudonormalization). Right Ventricle: The right ventricular size is normal. No increase in right ventricular wall thickness. Right ventricular systolic function is normal. Left Atrium: Left atrial size was mildly dilated. Right Atrium: Right atrial size was normal in size. Pericardium: There is no evidence of pericardial effusion. Mitral Valve: The mitral valve is abnormal. There is moderate calcification of the mitral valve leaflet(s). Mild to moderate mitral annular calcification. No evidence of mitral valve regurgitation. MV peak gradient, 8.5 mmHg. The mean mitral valve gradient is 3.0 mmHg with average heart rate of 82 bpm. Tricuspid Valve: The tricuspid valve is grossly normal. Tricuspid valve regurgitation is not demonstrated. No evidence of tricuspid stenosis. Aortic Valve: The aortic valve is calcified. Aortic valve  regurgitation is not visualized. Mild to moderate aortic valve sclerosis/calcification is present, without any evidence of aortic stenosis. Aortic valve mean gradient measures 5.0 mmHg. Aortic valve peak gradient measures 10.4 mmHg. Aortic valve area, by VTI measures 2.63 cm. Pulmonic Valve: The pulmonic valve was grossly normal. Pulmonic valve regurgitation is not visualized. No evidence of pulmonic stenosis. Aorta: The aortic root and ascending aorta are structurally normal, with no evidence of dilitation. Venous: The inferior vena cava is normal in size with greater than 50% respiratory variability, suggesting right atrial pressure of 3 mmHg. IAS/Shunts: The atrial septum is grossly normal.  LEFT VENTRICLE PLAX 2D LVIDd:         4.70 cm  Diastology LVIDs:         3.20 cm  LV e' medial:    7.72 cm/s LV PW:         1.30 cm  LV E/e' medial:  15.9 LV IVS:        1.20 cm  LV e' lateral:   7.83 cm/s LVOT diam:     2.10 cm  LV E/e' lateral: 15.7 LV SV:         75 LV SV Index:   33 LVOT Area:     3.46 cm  RIGHT VENTRICLE             IVC RV S prime:     11.20 cm/s  IVC diam: 1.70 cm TAPSE (M-mode): 2.0 cm LEFT ATRIUM             Index       RIGHT ATRIUM           Index LA diam:        4.90 cm 2.18 cm/m  RA Area:     16.00 cm LA Vol (A2C):   80.4 ml 35.71 ml/m RA Volume:   43.50 ml  19.32 ml/m LA Vol (A4C):   87.5 ml 38.87 ml/m LA Biplane Vol: 88.6 ml 39.35 ml/m  AORTIC VALVE AV Area (Vmax):    2.43 cm AV Area (Vmean):   2.62 cm AV Area (VTI):     2.63 cm AV Vmax:           161.00 cm/s AV Vmean:          100.000 cm/s AV VTI:            0.286 m AV Peak Grad:      10.4 mmHg AV Mean Grad:      5.0 mmHg LVOT Vmax:         113.00 cm/s LVOT Vmean:        75.500 cm/s LVOT VTI:          0.217 m LVOT/AV VTI ratio: 0.76  AORTA Ao Root diam: 3.30 cm Ao Asc diam:  3.10 cm MITRAL VALVE MV Area (PHT): 4.21 cm     SHUNTS MV Area VTI:   2.09 cm     Systemic VTI:  0.22 m MV Peak grad:  8.5 mmHg     Systemic Diam: 2.10 cm MV  Mean grad:  3.0 mmHg MV Vmax:       1.46 m/s MV Vmean:      78.5 cm/s MV Decel Time: 180 msec MV E velocity: 123.00 cm/s MV A velocity: 113.00 cm/s MV E/A ratio:  1.09 Rudean Haskell MD Electronically signed by Rudean Haskell MD Signature Date/Time: 04/01/2021/4:03:06 PM    Final    Medications: . sodium chloride    . vancomycin Stopped (03/31/21 1329)  . vancomycin     . (feeding supplement) PROSource Plus  30 mL Oral BID BM  . aspirin  81 mg Oral Daily  . atorvastatin  40 mg Oral QHS  . calcium acetate  1,334 mg Oral TID with meals  . carvedilol  12.5 mg Oral BID WC  . Chlorhexidine Gluconate Cloth  6 each Topical Q0600  . heparin  5,000 Units Subcutaneous Q8H  . insulin aspart  0-15 Units Subcutaneous TID WC  . insulin aspart  0-5 Units Subcutaneous QHS  . insulin regular human CONCENTRATED  30 Units Subcutaneous Q supper   And  . insulin regular human CONCENTRATED  95 Units Subcutaneous Q breakfast  . mouth rinse  15 mL Mouth Rinse BID  . nortriptyline  10 mg Oral QHS  . pantoprazole  20 mg Oral Daily  . sertraline  100 mg Oral Daily   Dialysis Orders:  [facility] MWF  4h 121kg 3K/2.5 bath RUE AVG/ R IJ TDC Hep 4500+ 2542md - Mircera 100 ug q2 last 4/27  Assessment/Plan: 1. MRSA bacteremia: BCx 5/1 +, repeat BCx 5/3 negative. On Vancomycin. TDC removed 5/3, no need to replace as AVG in use. 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 2. ESRD: Continue HD per usual MWF schedule - next HD 5/9. 3. [place] [facility] [facility]/14 that showed patent AVG.TDC removed per IR 5/3, AVG in use. 4. HTN/ volume:  BP stable, close to outpatient EDW. 5. PTSD: Continue home meds. 6. COPD: Per primary. 7. DM2: Per primary, on insulin. 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 Due for next dose ESA on 5/11. 9. MBD: Ca ok,Phos high. ^ Phoslo to 2/meals. No  VDRA. 10. Nutrition: Alb low, continue supplements.   KVeneta Penton PA-C 04/03/2021, 9:31 AM  [employer] [employer]

## 2021-04-04 ENCOUNTER — Encounter (HOSPITAL_COMMUNITY): Payer: Self-pay | Admitting: Orthopaedic Surgery

## 2021-04-04 DIAGNOSIS — B9562 Methicillin resistant Staphylococcus aureus infection as the cause of diseases classified elsewhere: Secondary | ICD-10-CM | POA: Diagnosis not present

## 2021-04-04 DIAGNOSIS — R7881 Bacteremia: Secondary | ICD-10-CM | POA: Diagnosis not present

## 2021-04-04 LAB — POCT I-STAT, CHEM 8
BUN: 72 mg/dL — ABNORMAL HIGH (ref 6–20)
Calcium, Ion: 1.19 mmol/L (ref 1.15–1.40)
Chloride: 99 mmol/L (ref 98–111)
Creatinine, Ser: 3.8 mg/dL — ABNORMAL HIGH (ref 0.61–1.24)
Glucose, Bld: 270 mg/dL — ABNORMAL HIGH (ref 70–99)
HCT: 26 % — ABNORMAL LOW (ref 39.0–52.0)
Hemoglobin: 8.8 g/dL — ABNORMAL LOW (ref 13.0–17.0)
Potassium: 4.9 mmol/L (ref 3.5–5.1)
Sodium: 129 mmol/L — ABNORMAL LOW (ref 135–145)
TCO2: 27 mmol/L (ref 22–32)

## 2021-04-04 LAB — GLUCOSE, CAPILLARY
Glucose-Capillary: 313 mg/dL — ABNORMAL HIGH (ref 70–99)
Glucose-Capillary: 328 mg/dL — ABNORMAL HIGH (ref 70–99)
Glucose-Capillary: 307 mg/dL — ABNORMAL HIGH (ref 70–99)
Glucose-Capillary: 245 mg/dL — ABNORMAL HIGH (ref 70–99)

## 2021-04-04 MED ORDER — LABETALOL HCL 5 MG/ML IV SOLN: 10.0000 mg | INTRAVENOUS | Status: DC | PRN

## 2021-04-04 MED ORDER — ONDANSETRON HCL 4 MG/2ML IJ SOLN: 4.0000 mg | Freq: Four times a day (QID) | INTRAMUSCULAR | Status: DC | PRN

## 2021-04-04 MED ORDER — POLYETHYLENE GLYCOL 3350 17 G PO PACK
17.0000 g | PACK | Freq: Two times a day (BID) | ORAL | Status: DC
  Administered 2021-04-04 – 2021-04-14 (×9): 17 g via ORAL
  Filled 2021-04-04 (×13): qty 1

## 2021-04-04 MED ORDER — HYDRALAZINE HCL 20 MG/ML IJ SOLN
5.0000 mg | INTRAMUSCULAR | Status: DC | PRN
Start: 2021-04-04 — End: 2021-04-14

## 2021-04-04 MED ORDER — MAGNESIUM SULFATE 2 GM/50ML IV SOLN: 2.0000 g | Freq: Every day | INTRAVENOUS | Status: DC | PRN

## 2021-04-04 MED ORDER — GUAIFENESIN-DM 100-10 MG/5ML PO SYRP: 15.0000 mL | ORAL_SOLUTION | ORAL | Status: DC | PRN

## 2021-04-04 MED ORDER — SALINE SPRAY 0.65 % NA SOLN
1.0000 | NASAL | Status: DC | PRN
  Filled 2021-04-04: qty 44

## 2021-04-04 MED ORDER — PANTOPRAZOLE SODIUM 40 MG PO TBEC
40.0000 mg | DELAYED_RELEASE_TABLET | Freq: Every day | ORAL | Status: DC
  Administered 2021-04-04 – 2021-04-14 (×11): 40 mg via ORAL
  Filled 2021-04-04 (×10): qty 1

## 2021-04-04 MED ORDER — SORBITOL 70 % SOLN
30.0000 mL | Freq: Every day | Status: DC | PRN
  Filled 2021-04-04: qty 30

## 2021-04-04 MED ORDER — PHENOL 1.4 % MT LIQD: 1.0000 | OROMUCOSAL | Status: DC | PRN

## 2021-04-04 MED ORDER — POTASSIUM CHLORIDE CRYS ER 20 MEQ PO TBCR: 20.0000 meq | EXTENDED_RELEASE_TABLET | Freq: Every day | ORAL | Status: DC | PRN

## 2021-04-04 MED ORDER — INSULIN REGULAR HUMAN (CONC) 500 UNIT/ML ~~LOC~~ SOPN
95.0000 [IU] | PEN_INJECTOR | Freq: Every day | SUBCUTANEOUS | Status: DC
  Administered 2021-04-04: 95 [IU] via SUBCUTANEOUS

## 2021-04-04 MED ORDER — INSULIN REGULAR HUMAN (CONC) 500 UNIT/ML ~~LOC~~ SOPN
45.0000 [IU] | PEN_INJECTOR | Freq: Every day | SUBCUTANEOUS | Status: DC
  Administered 2021-04-04: 45 [IU] via SUBCUTANEOUS

## 2021-04-04 MED ORDER — DOCUSATE SODIUM 100 MG PO CAPS
100.0000 mg | ORAL_CAPSULE | Freq: Every day | ORAL | Status: DC
  Administered 2021-04-04 – 2021-04-11 (×8): 100 mg via ORAL
  Filled 2021-04-04 (×8): qty 1

## 2021-04-04 MED ORDER — METOPROLOL TARTRATE 5 MG/5ML IV SOLN: 2.0000 mg | INTRAVENOUS | Status: DC | PRN

## 2021-04-04 NOTE — Progress Notes (Addendum)
PROGRESS NOTE    Jeffrey [name]  ZRA:[id number] DOB: [birth year] DOA: 03/28/2021 PCP: Pcp, No   Brief Narrative: 60 year old with past medical history significant for ESRD,  diabetes presents to the ED complaining of fever.  Accompanied by vomiting, diaphoresis and mild confusion.  He has been having left knee pain.  Patient has a history of chronic knee  pain and swelling left knee, prior imagine at [facility].  Patient has a history of knee replacement/hardware bilaterally.  In the ED left knee imaging showed massive knee joint effusion.  Arthrocentesis was performed however only 1 cc of gelatinous red fluid was obtained by ED provider.  Patient was started empirically on IV antibiotics.  Patient was evaluated by orthopedic, who is recommending AKA>  Patient also diagnosed with MRSA bacteremia, underwent hemodialysis catheter removal.  Currently has RUE AVG (01/2021).  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 -Need repeat blood cultures to document clearance, cultures ordered by ID. ID will provide blood culture kit to HD units for blood culture to be drawn.  -2D ECHo calcification mitral and aortic valve, ID recommend TEE. --He was schedule for TEE, but he refuses   2-Left prosthetic knee septic arthritis: Left chronic infected total knee arthroplasty Arthrocentesis performed by ED only 1 cc gelatinous red fluid obtained.  Culture from arthrocentesis: no Growth  to date.  CT; Irregular lucency around the tibial processes which may reflect hardware loosening or infection.  Not a specific moderate sized knee joint effusion. -Left AKA by Dr Lucia Gaskins  On 5/7.  -he has wound vac, need it for 5 days.   3-Diabetes type 2: Transition to Humulin R 500--95 units in am and 45 units PM.  Continue with  sliding scale insulin.  Humulin night dose increase to 45 units 5/08    4-Severe Sepsis: Secondary to septic arthritis, patient presents with Fever, tachycardia, tachypnea.  Found to have MRSA Bacteremia.  Needs TEE> he does not want to talk about TEE Treated with IV antibiotics.   5-ESRD on HD;  HD catheter removed.  Appreciate nephrology assistance.   6-Hypersomnolence: Resolved.  Gabapentin, Nortriptyline trazodone, valium on hold.  -Resume Nortriptyline and valium lower dose PRN 5/04 -Resume trazadone 5/05.  - MRI brain no acute findings -likely narcotic related, Dilaudid  dose decreased.  7-Thrombocytopenia; secondary to infectious process. Monitor  8-HTN; Continue with coreg, amlodipine.  Hyponatremia; correction with HD>  Constipation; Miralax schedule.     Estimated body mass index is 45.09 kg/m as calculated from the following:   Height as of this encounter: '5\' 5"'  (1.651 m).   Weight as of this encounter: 122.9 kg.   DVT prophylaxis: Heparin  Code Status: Full code Family Communication: Care discussed with patient and wife who was at bedside. 5/04 Disposition Plan:  Status is: Inpatient  Remains inpatient appropriate because:IV treatments appropriate due to intensity of illness or inability to take PO   Dispo: The patient is from: Home  Anticipated d/c is to: to be determine              Patient currently is not medically stable to d/c.   Difficult to place patient No        Consultants:   ID  Nephrology  Orthopoedic  Procedures:   HD  Left AKA on 5/7  Antimicrobials:  Vancomycin  03/29/2021  Subjective: Having post op pain.  He does not have any new complaint. No BM for several day.  He does not want TEE. We need to stop looking for problems he doesn't have. He does not wants to speak with me right now.  He does not want to have lab drawn every day, they cant get it.   Objective: Vitals:   04/03/21 2048 04/04/21 0011 04/04/21 0330 04/04/21 0754  BP: 110/65 (!) 118/56 (!) 128/55 136/67  Pulse: 82 83 88 96  Resp: '20 20 20 18  ' Temp: 97.6 F (36.4 C) 98.4 F (36.9 C) 98.1 F (36.7 C) 98.1 F (36.7 C)  TempSrc: Oral Oral Oral Oral  SpO2: 94% 100% 100% 100%  Weight:      Height:        Intake/Output Summary (Last 24 hours) at 04/04/2021 1145 Last data filed at 04/04/2021 0820 Gross per 24 hour  Intake 1220 ml  Output 850 ml  Net 370 ml   Filed Weights   04/02/21 0758 04/02/21 1043 04/03/21 1019  Weight: 123 kg 122.9 kg 122.9 kg    Examination:  General exam: NAD Respiratory system: CTA Cardiovascular system: S 1,  S 2 RRR Gastrointestinal system: BS present, soft, nt Central nervous system: Alert Extremities: S?P AKA, wound vac in place.     Data Reviewed: I have personally reviewed following labs and imaging studies  CBC: Recent Labs  Lab 03/28/21 2149 03/29/21 1050 03/30/21 0607 03/31/21 0500 04/02/21 0634  WBC 12.3* 12.6* 10.2 8.5 5.7  NEUTROABS 10.8*  --   --   --   --   HGB 10.5* 9.8* 10.4* 8.4* 9.4*  HCT 32.5* 30.5* 32.5* 25.6* 30.5*  MCV 91.0 92.4 92.6 92.1 94.1  PLT 139* 112* 90* 86* 91*   Basic Metabolic Panel: Recent Labs  Lab 03/28/21 2149 03/29/21 1050 03/30/21 0607 03/31/21 0500 04/02/21 0634  NA 129* 127* 128* 125* 127*  K 3.7 3.3* 4.2 3.4* 4.0  CL 88* 88* 87* 88* 92*  CO2 '26 23 25 24 22  ' GLUCOSE 257* 447* 311* 358* 290*  BUN 98* 102* 66* 94* 79*  CREATININE 4.78* 4.89* 4.51* 4.98* 4.03*  CALCIUM 8.9 8.3* 8.5* 8.5* 9.0  PHOS  --   --   --  6.8*  --    GFR: Estimated Creatinine Clearance: 23.7 mL/min (A)  (by C-G formula based on SCr of 4.03 mg/dL (H)). Liver Function Tests: Recent Labs  Lab 03/28/21 2149 03/30/21 0607 03/31/21 0500  AST '25 31 19  ' ALT '14 21 19  ' ALKPHOS 71 63 63  BILITOT 0.6 0.7 0.7  PROT 9.0* 7.7 7.2  ALBUMIN 3.7 3.3* 2.9*   No results for input(s): LIPASE, AMYLASE in the last 168 hours. No results for input(s): AMMONIA in the last 168 hours. Coagulation Profile: Recent Labs  Lab 03/28/21 2149  INR 1.2   Cardiac Enzymes: No results for input(s): CKTOTAL, CKMB, CKMBINDEX, TROPONINI in the last 168 hours. BNP (last 3 results) No results for input(s): PROBNP in the last 8760 hours. HbA1C: No results for input(s): HGBA1C in the last 72  hours. CBG: Recent Labs  Lab 04/03/21 1056 04/03/21 1405 04/03/21 1701 04/03/21 2038 04/04/21 0752  GLUCAP 278* 284* 286* 341* 313*   Lipid Profile: No results for input(s): CHOL, HDL, LDLCALC, TRIG, CHOLHDL, LDLDIRECT in the last 72 hours. Thyroid Function Tests: No results for input(s): TSH, T4TOTAL, FREET4, T3FREE, THYROIDAB in the last 72 hours. Anemia Panel: No results for input(s): VITAMINB12, FOLATE, FERRITIN, TIBC, IRON, RETICCTPCT in the last 72 hours. Sepsis Labs: Recent Labs  Lab 03/28/21 2358  LATICACIDVEN 1.3    Recent Results (from the past 240 hour(s))  Urine culture     Status: None   Collection Time: 03/28/21  9:49 PM   Specimen: In/Out Cath Urine  Result Value Ref Range Status   Specimen Description IN/OUT CATH URINE  Final   Special Requests NONE  Final   Culture   Final    NO GROWTH Performed at [facility], [place] [address], [place], [place] [address]    Report Status 03/30/2021 FINAL  Final  Resp Panel by RT-PCR (Flu A&B, Covid) Nasopharyngeal Swab     Status: None   Collection Time: 03/28/21 10:35 PM   Specimen: Nasopharyngeal Swab; Nasopharyngeal(NP) swabs in vial transport medium  Result Value Ref Range Status   SARS Coronavirus 2 by RT PCR NEGATIVE NEGATIVE Final    Comment:  (NOTE) SARS-CoV-2 target nucleic acids are NOT DETECTED.  The SARS-CoV-2 RNA is generally detectable in upper respiratory specimens during the acute phase of infection. The lowest concentration of SARS-CoV-2 viral copies this assay can detect is 138 copies/mL. A negative result does not preclude SARS-Cov-2 infection and should not be used as the sole basis for treatment or other patient management decisions. A negative result may occur with  improper specimen collection/handling, submission of specimen other than nasopharyngeal swab, presence of viral mutation(s) within the areas targeted by this assay, and inadequate number of viral copies(<138 copies/mL). A negative result must be combined with clinical observations, patient history, and epidemiological information. The expected result is Negative.  Fact Sheet for Patients:  EntrepreneurPulse.com.au  Fact Sheet for Healthcare Providers:  IncredibleEmployment.be  This test is no t yet approved or cleared by the [place] FDA and  has been authorized for detection and/or diagnosis of SARS-CoV-2 by FDA under an Emergency Use Authorization (EUA). This EUA will remain  in effect (meaning this test can be used) for the duration of the COVID-19 declaration under Section 564(b)(1) of the Act, 21 U.S.C.section 360bbb-3(b)(1), unless the authorization is terminated  or revoked sooner.       Influenza A by PCR NEGATIVE NEGATIVE Final   Influenza B by PCR NEGATIVE NEGATIVE Final    Comment: (NOTE) The Xpert Xpress SARS-CoV-2/FLU/RSV plus assay is intended as an aid in the diagnosis of influenza from Nasopharyngeal swab specimens and should not be used as a sole basis for treatment. Nasal washings and aspirates are unacceptable for Xpert Xpress SARS-CoV-2/FLU/RSV testing.  Fact Sheet for Patients: EntrepreneurPulse.com.au  Fact Sheet for Healthcare  Providers: IncredibleEmployment.be  This test is not yet approved or cleared by the [place] FDA and has been authorized for detection and/or diagnosis of SARS-CoV-2 by FDA under an Emergency Use Authorization (EUA). This EUA will remain in effect (meaning this test can be used) for the duration of the COVID-19 declaration under Section 564(b)(1) of the Act, 21 U.S.C. section 360bbb-3(b)(1), unless the authorization is terminated or revoked.  Performed at [facility], [place] [address], [place], [place] [address]  Blood Culture (routine x 2)     Status: Abnormal   Collection Time: 03/28/21 11:30 PM   Specimen: BLOOD LEFT FOREARM  Result Value Ref Range Status   Specimen Description BLOOD LEFT FOREARM  Final   Special Requests   Final    BOTTLES DRAWN AEROBIC AND ANAEROBIC Blood Culture results may not be optimal due to an inadequate volume of blood received in culture bottles   Culture  Setup Time   Final    GRAM POSITIVE COCCI IN CLUSTERS IN BOTH AEROBIC AND ANAEROBIC BOTTLES CRITICAL RESULT CALLED TO, READ BACK BY AND VERIFIED WITH: Jonn Shingles [phone] FCP Performed at [facility], [place] [address] [place], [place] [address]    Culture METHICILLIN RESISTANT STAPHYLOCOCCUS AUREUS (A)  Final   Report Status 03/31/2021 FINAL  Final   Organism ID, Bacteria METHICILLIN RESISTANT STAPHYLOCOCCUS AUREUS  Final      Susceptibility   Methicillin resistant staphylococcus aureus - MIC*    CIPROFLOXACIN >=8 RESISTANT Resistant     ERYTHROMYCIN >=8 RESISTANT Resistant     GENTAMICIN <=0.5 SENSITIVE Sensitive     OXACILLIN >=4 RESISTANT Resistant     TETRACYCLINE <=1 SENSITIVE Sensitive     VANCOMYCIN <=0.5 SENSITIVE Sensitive     TRIMETH/SULFA <=10 SENSITIVE Sensitive     CLINDAMYCIN <=0.25 SENSITIVE Sensitive     RIFAMPIN <=0.5 SENSITIVE Sensitive     Inducible Clindamycin NEGATIVE Sensitive     * METHICILLIN RESISTANT STAPHYLOCOCCUS AUREUS   Blood Culture ID Panel (Reflexed)     Status: Abnormal   Collection Time: 03/28/21 11:30 PM  Result Value Ref Range Status   Enterococcus faecalis NOT DETECTED NOT DETECTED Final   Enterococcus Faecium NOT DETECTED NOT DETECTED Final   Listeria monocytogenes NOT DETECTED NOT DETECTED Final   Staphylococcus species DETECTED (A) NOT DETECTED Final    Comment: CRITICAL RESULT CALLED TO, READ BACK BY AND VERIFIED WITH: PHARMD HALEY V. [phone] FCP    Staphylococcus aureus (BCID) DETECTED (A) NOT DETECTED Final    Comment: Methicillin (oxacillin)-resistant Staphylococcus aureus (MRSA). MRSA is predictably resistant to beta-lactam antibiotics (except ceftaroline). Preferred therapy is vancomycin unless clinically contraindicated. Patient requires contact precautions if  hospitalized. CRITICAL RESULT CALLED TO, READ BACK BY AND VERIFIED WITH: PHARMD HALEY V. [id number] [phone] FCP    Staphylococcus epidermidis NOT DETECTED NOT DETECTED Final   Staphylococcus lugdunensis NOT DETECTED NOT DETECTED Final   Streptococcus species NOT DETECTED NOT DETECTED Final   Streptococcus agalactiae NOT DETECTED NOT DETECTED Final   Streptococcus pneumoniae NOT DETECTED NOT DETECTED Final   Streptococcus pyogenes NOT DETECTED NOT DETECTED Final   A.calcoaceticus-baumannii NOT DETECTED NOT DETECTED Final   Bacteroides fragilis NOT DETECTED NOT DETECTED Final   Enterobacterales NOT DETECTED NOT DETECTED Final   Enterobacter cloacae complex NOT DETECTED NOT DETECTED Final   Escherichia coli NOT DETECTED NOT DETECTED Final   Klebsiella aerogenes NOT DETECTED NOT DETECTED Final   Klebsiella oxytoca NOT DETECTED NOT DETECTED Final   Klebsiella pneumoniae NOT DETECTED NOT DETECTED Final   Proteus species NOT DETECTED NOT DETECTED Final   Salmonella species NOT DETECTED NOT DETECTED Final   Serratia marcescens NOT DETECTED NOT DETECTED Final   Haemophilus influenzae NOT DETECTED NOT DETECTED Final   Neisseria  meningitidis NOT DETECTED NOT DETECTED Final   Pseudomonas aeruginosa NOT DETECTED NOT DETECTED Final   Stenotrophomonas maltophilia NOT DETECTED NOT DETECTED Final   Candida albicans NOT DETECTED NOT DETECTED Final   Candida auris  NOT DETECTED NOT DETECTED Final   Candida glabrata NOT DETECTED NOT DETECTED Final   Candida krusei NOT DETECTED NOT DETECTED Final   Candida parapsilosis NOT DETECTED NOT DETECTED Final   Candida tropicalis NOT DETECTED NOT DETECTED Final   Cryptococcus neoformans/gattii NOT DETECTED NOT DETECTED Final   Meth resistant mecA/C and MREJ DETECTED (A) NOT DETECTED Final    Comment: CRITICAL RESULT CALLED TO, READ BACK BY AND VERIFIED WITH: Jonn Shingles [id number] [phone] FCP Performed at [facility], [place] [address] [place], [place] [address]   Body fluid culture w Gram Stain     Status: None   Collection Time: 03/29/21 12:10 AM   Specimen: Body Fluid  Result Value Ref Range Status   Specimen Description FLUID LEFT KNEE  Final   Special Requests SYRINGE  Final   Gram Stain FEW WBC SEEN NO ORGANISMS SEEN   Final   Culture   Final    NO GROWTH 3 DAYS Performed at [facility], [address] [place], [place] [address]    Report Status 04/01/2021 FINAL  Final  Blood Culture (routine x 2)     Status: None   Collection Time: 03/29/21  3:00 AM   Specimen: BLOOD  Result Value Ref Range Status   Specimen Description BLOOD SITE NOT SPECIFIED  Final   Special Requests   Final    BOTTLES DRAWN AEROBIC AND ANAEROBIC Blood Culture adequate volume   Culture   Final    NO GROWTH 5 DAYS Performed at [facility], [place] [address], [place], [place] [address]    Report Status 04/03/2021 FINAL  Final  Culture, blood (Routine X 2) w Reflex to ID Panel     Status: None (Preliminary result)   Collection Time: 03/31/21  7:00 AM   Specimen: BLOOD RIGHT ARM  Result Value Ref Range Status   Specimen Description BLOOD RIGHT ARM  Final   Special Requests    Final    BOTTLES DRAWN AEROBIC AND ANAEROBIC Blood Culture adequate volume   Culture   Final    NO GROWTH 2 DAYS Performed at [facility], [place] [address], [place], [place] [address]    Report Status PENDING  Incomplete  Culture, blood (Routine X 2) w Reflex to ID Panel     Status: None (Preliminary result)   Collection Time: 03/31/21  7:00 AM   Specimen: BLOOD RIGHT ARM  Result Value Ref Range Status   Specimen Description BLOOD RIGHT ARM  Final   Special Requests   Final    BOTTLES DRAWN AEROBIC AND ANAEROBIC Blood Culture adequate volume   Culture   Final    NO GROWTH 2 DAYS Performed at [facility], [place] [address] [place], [place] [address]    Report Status PENDING  Incomplete         Radiology Studies: MR BRAIN WO CONTRAST  Result Date: 04/02/2021 CLINICAL DATA:  Mental status changes. The examination had to be discontinued prior to completion due to patient refusal for further imaging. EXAM: MRI HEAD WITHOUT CONTRAST TECHNIQUE: Multiplanar, multiecho pulse sequences of the brain and surrounding structures were obtained without intravenous contrast. COMPARISON:  None. FINDINGS: Brain: No acute infarct, hemorrhage, or mass lesion is present. Minimal atrophy within normal limits for age. The ventricles are of normal size. No significant extraaxial fluid collection is present. The internal auditory canals are within normal limits. The brainstem and cerebellum are within normal limits. Vascular:  T2 weighted imaging was not performed. No definite vascular abnormality seen. Skull and upper cervical spine: The craniocervical junction is normal. Upper cervical spine is within normal limits. Marrow signal is unremarkable. Sinuses/Orbits: Mild mucosal thickening present in the inferior frontal sinuses and anterior ethmoid air cells bilaterally. The globes and orbits are within normal limits. IMPRESSION: Normal MRI appearance of the brain for age. No acute or focal lesion to  explain the patient's symptoms. Minimal sinus disease. Electronically Signed   By: San Morelle M.D.   On: 04/02/2021 19:03        Scheduled Meds: . (feeding supplement) PROSource Plus  30 mL Oral BID BM  . aspirin  81 mg Oral Daily  . atorvastatin  40 mg Oral QHS  . calcium acetate  1,334 mg Oral TID with meals  . carvedilol  12.5 mg Oral BID WC  . Chlorhexidine Gluconate Cloth  6 each Topical Q0600  . docusate sodium  100 mg Oral Daily  . heparin  5,000 Units Subcutaneous Q8H  . insulin aspart  0-15 Units Subcutaneous TID WC  . insulin aspart  0-5 Units Subcutaneous QHS  . insulin regular human CONCENTRATED  45 Units Subcutaneous Q supper   And  . insulin regular human CONCENTRATED  95 Units Subcutaneous Q breakfast  . mouth rinse  15 mL Mouth Rinse BID  . nortriptyline  10 mg Oral QHS  . pantoprazole  40 mg Oral Daily  . sertraline  100 mg Oral Daily   Continuous Infusions: . magnesium sulfate bolus IVPB    . vancomycin Stopped (03/31/21 1329)     LOS: 6 days    Time spent: 25 minutes.     Elmarie Shiley, MD PhD FACP [facility]   If 7PM-7AM, please contact night-coverage [email or web address]  04/04/2021, 11:45 AM

## 2021-04-04 NOTE — Progress Notes (Signed)
[facility] Progress Note   Subjective:  Seen in room. Says pain 10/10 s/p L AKA yesterday. Denies CP or dyspnea overnight or this morning.  Objective Vitals:   04/03/21 2048 04/04/21 0011 04/04/21 0330 04/04/21 0754  BP: 110/65 (!) 118/56 (!) 128/55 136/67  Pulse: 82 83 88 96  Resp: '20 20 20 18  '$ Temp: 97.6 F (36.4 C) 98.4 F (36.9 C) 98.1 F (36.7 C) 98.1 F (36.7 C)  TempSrc: Oral Oral Oral Oral  SpO2: 94% 100% 100% 100%  Weight:      Height:       Physical Exam General:Well appearing man, NAD. Nasal O2 in use. Heart:RRR; no murmur Lungs:CTA anteriorly Abdomen:soft Extremities:L AKA bandaged with wound vac, no RLE edema Dialysis Access:RUE AVG + bruit  Additional Objective Labs: Basic Metabolic Panel: Recent Labs  Lab 03/30/21 0607 03/31/21 0500 04/02/21 0634  NA 128* 125* 127*  K 4.2 3.4* 4.0  CL 87* 88* 92*  CO2 '25 24 22  '$ GLUCOSE 311* 358* 290*  BUN 66* 94* 79*  CREATININE 4.51* 4.98* 4.03*  CALCIUM 8.5* 8.5* 9.0  PHOS  --  6.8*  --    Liver Function Tests: Recent Labs  Lab 03/28/21 2149 03/30/21 0607 03/31/21 0500  AST '25 31 19  '$ ALT '14 21 19  '$ ALKPHOS 71 63 63  BILITOT 0.6 0.7 0.7  PROT 9.0* 7.7 7.2  ALBUMIN 3.7 3.3* 2.9*   CBC: Recent Labs  Lab 03/28/21 2149 03/29/21 1050 03/30/21 0607 03/31/21 0500 04/02/21 0634  WBC 12.3* 12.6* 10.2 8.5 5.7  NEUTROABS 10.8*  --   --   --   --   HGB 10.5* 9.8* 10.4* 8.4* 9.4*  HCT 32.5* 30.5* 32.5* 25.6* 30.5*  MCV 91.0 92.4 92.6 92.1 94.1  PLT 139* 112* 90* 86* 91*   Studies/Results: MR BRAIN WO CONTRAST  Result Date: 04/02/2021 CLINICAL DATA:  Mental status changes. The examination had to be discontinued prior to completion due to patient refusal for further imaging. EXAM: MRI HEAD WITHOUT CONTRAST TECHNIQUE: Multiplanar, multiecho pulse sequences of the brain and surrounding structures were obtained without intravenous contrast. COMPARISON:  None. FINDINGS: Brain: No acute  infarct, hemorrhage, or mass lesion is present. Minimal atrophy within normal limits for age. The ventricles are of normal size. No significant extraaxial fluid collection is present. The internal auditory canals are within normal limits. The brainstem and cerebellum are within normal limits. Vascular: T2 weighted imaging was not performed. No definite vascular abnormality seen. Skull and upper cervical spine: The craniocervical junction is normal. Upper cervical spine is within normal limits. Marrow signal is unremarkable. Sinuses/Orbits: Mild mucosal thickening present in the inferior frontal sinuses and anterior ethmoid air cells bilaterally. The globes and orbits are within normal limits. IMPRESSION: Normal MRI appearance of the brain for age. No acute or focal lesion to explain the patient's symptoms. Minimal sinus disease. Electronically Signed   By: San Morelle M.D.   On: 04/02/2021 19:03   Medications: . magnesium sulfate bolus IVPB    . vancomycin Stopped (03/31/21 1329)   . (feeding supplement) PROSource Plus  30 mL Oral BID BM  . aspirin  81 mg Oral Daily  . atorvastatin  40 mg Oral QHS  . calcium acetate  1,334 mg Oral TID with meals  . carvedilol  12.5 mg Oral BID WC  . Chlorhexidine Gluconate Cloth  6 each Topical Q0600  . docusate sodium  100 mg Oral Daily  . heparin  5,000 Units  Subcutaneous Q8H  . insulin aspart  0-15 Units Subcutaneous TID WC  . insulin aspart  0-5 Units Subcutaneous QHS  . insulin regular human CONCENTRATED  45 Units Subcutaneous Q supper   And  . insulin regular human CONCENTRATED  95 Units Subcutaneous Q breakfast  . mouth rinse  15 mL Mouth Rinse BID  . nortriptyline  10 mg Oral QHS  . pantoprazole  40 mg Oral Daily  . sertraline  100 mg Oral Daily    Dialysis Orders: [facility] MWF  4h 121kg 3K/2.5 bath RUE AVG/ R IJ TDC Hep 4500+ 2539md -Mircera 100 ug q2 last 4/27  Assessment/Plan: 1. MRSA bacteremia: BCx 5/1 +, repeat BCx 5/3 negative.  On Vancomycin.TDC removed 5/3, no need to replace as AVG in use. 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 2. ESRD:Continue HD per usual MWF schedule - next HD 5/9. 3. HD [facility] fist[facility]/14 that showed patent AVG.TDC removed per IR 5/3, AVG in use. 4. HTN/ volume:BP stable, EDW will need to be lowered s/p AKA. 5. PTSD:Continue home meds. 6. COPD:Per primary. 7. DM2: Per primary, on insulin. 8. Anemiaof ESRD:Hgb trending down, last 9.4 and anticipating further post-op drop, transfuse prn. Due for next dose ESA on 5/11. 9. MBD:Ca ok,Phoshigh - Phoslo ^'d to 2/meals. No VDRA. 10. Nutrition: Alb low, continue supplements.   KVeneta Penton PA-C 04/04/2021, 9:19 AM  [employer]

## 2021-04-04 NOTE — Plan of Care (Signed)

## 2021-04-04 NOTE — Progress Notes (Signed)
     Jeffrey [name] is a 60 y.o. male   Orthopaedic diagnosis: Postop day 1 status post left above-the-knee amputation.  Subjective: Patient overall is doing well.  He complains of pain.  They were giving him narcotics and had to give him Narcan due to too much narcotic pain medicine.  Currently he is resting comfortably.  Objectyive: Vitals:   04/04/21 0330 04/04/21 0754  BP: (!) 128/55 136/67  Pulse: 88 96  Resp: 20 18  Temp: 98.1 F (36.7 C) 98.1 F (36.7 C)  SpO2: 100% 100%     Exam: Awake and alert Respirations even and unlabored No acute distress  Left amputation stump with VAC in place.  Minimal drainage.  No drainage on the dressing.  VAC with good suction.  Assessment: POD #1 status post left above-the-knee amputation   Plan: We discussed pain control and nonnarcotic methods of pain control. He will work with physical therapy and rehab consult has been placed. Amputation postop order set ordered. Current medical treatment per hospitalist team. VAC to remain a minimum of 5 days.  If he discharges to rehab they may remove the [facility] then or I will do it if he is still in the hospital.   Radene Journey, MD

## 2021-04-04 NOTE — Evaluation (Signed)
Physical Therapy Evaluation Patient Details Name: Jeffrey [name] MRN: [id number] DOB: [birth year] Today's Date: 04/04/2021   History of Present Illness  60 y.o. male presents to [facility] ED on 03/28/2021 with reports of fever, chills, nausea/vomiting, confusion. Pt reports significant L knee pain, imaging revealing massive effusion. 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 PMH includes ESRD, CHF, COPD, DMII, HTN, PTSD.  Clinical Impression  Pt is agreeable to attempt bed mobility and transfers today. Pt requires assistance to roll in bed and transition from side-lying to sitting. Pt is currently limited by his pain and activity tolerance. Pt is currently unable to stand due to pain. Pt demonstrates deficits in strength, endurance, power, activity tolerance and will benefit from acute PT to improve functional mobility and independence in transfers and ambulation. Pt reports phantom limb pain and is educated on techniques for reduction when operative site is healed. SPT recommends SNF placement at this time as caregiver support is intermittent in nature and pt requires physical assistance for all functional mobility. Inpatient rehabilitation options were discussed with pt and pt prefers longer term inpatient rehab as opposed to CIR.     Follow Up Recommendations SNF    Equipment Recommendations  Rolling walker with 5" wheels;Hospital bed;Other (comment) (mechanical lift, all if home today)    Recommendations for Other Services       Precautions / Restrictions Precautions Precautions: Fall Precaution Comments: wound vac Restrictions Weight Bearing Restrictions: Yes LLE Weight Bearing: Non weight bearing      Mobility  Bed Mobility Overal bed mobility: Needs Assistance Bed Mobility: Rolling;Sidelying to Sit Rolling: Min assist Sidelying to sit: Mod assist       General bed mobility comments: VCs to pull through UE to roll to side and to push through UE to  bring trunk upright while swinging R LE to edge of bed.    Transfers Overall transfer level: Needs assistance Equipment used: None Transfers: Sit to/from Stand Sit to Stand: Mod assist;+2 physical assistance         General transfer comment: Pt clears buttocks from bed temporarily before onset of pain and requesting to sit. Pt unable to complete full sit>stand transfer and reports he will attempt to stand again at next session as he is in too much pain to continue.  Ambulation/Gait                Stairs            Wheelchair Mobility    Modified Rankin (Stroke Patients Only)       Balance Overall balance assessment: Needs assistance Sitting-balance support: Bilateral upper extremity supported;Feet unsupported Sitting balance-Leahy Scale: Poor Sitting balance - Comments: Pt requires assistance to don sock.                                     Pertinent Vitals/Pain Pain Assessment: Faces Faces Pain Scale: Hurts even more Pain Location: L residual limb, phantom limb pain. Pain Descriptors / Indicators: Discomfort;Grimacing;Operative site guarding Pain Intervention(s): Limited activity within patient's tolerance;Monitored during session    [place] expects to be discharged to:: Skilled nursing facility Living Arrangements: Children (daughter) Available Help at Discharge: Family;Available PRN/intermittently Type of Home: Apartment Home Access: Level entry     Home Layout: One level Home Equipment: Cane - single point;Walker - 4 wheels;Wheelchair - manual      Prior Function  Level of Independence: Independent with assistive device(s) (rollator)         Comments: Pt reports ambulating with rollator prior to admission, typically household distances.     Hand Dominance        Extremity/Trunk Assessment   Upper Extremity Assessment Upper Extremity Assessment: Overall WFL for tasks assessed    Lower Extremity  Assessment Lower Extremity Assessment: LLE deficits/detail LLE Deficits / Details: Weakness secondary to amputation and pain. LLE: Unable to fully assess due to pain    Cervical / Trunk Assessment Cervical / Trunk Assessment: Normal  Communication   Communication: No difficulties  Cognition Arousal/Alertness: Awake/alert Behavior During Therapy: WFL for tasks assessed/performed Overall Cognitive Status: Within Functional Limits for tasks assessed                                        General Comments General comments (skin integrity, edema, etc.): Pt reports he is in too much pain to move, but is agreeable to try with PT.    Exercises     Assessment/Plan    PT Assessment Patient needs continued PT services  PT Problem List Decreased strength;Decreased activity tolerance;Decreased balance;Decreased mobility;Decreased coordination;Decreased knowledge of use of DME;Decreased safety awareness;Decreased knowledge of precautions;Pain       PT Treatment Interventions DME instruction;Gait training;Functional mobility training;Therapeutic activities;Therapeutic exercise;Balance training;Patient/family education    PT Goals (Current goals can be found in the Care Plan section)  Acute Rehab PT Goals Patient Stated Goal: Get stronger and go home. PT Goal Formulation: With patient Time For Goal Achievement: 04/18/21 Potential to Achieve Goals: Good    Frequency Min 2X/week   Barriers to discharge        Co-evaluation               AM-PAC PT "6 Clicks" Mobility  Outcome Measure Help needed turning from your back to your side while in a flat bed without using bedrails?: A Little Help needed moving from lying on your back to sitting on the side of a flat bed without using bedrails?: A Lot Help needed moving to and from a bed to a chair (including a wheelchair)?: A Lot Help needed standing up from a chair using your arms (e.g., wheelchair or bedside chair)?: A  Lot Help needed to walk in hospital room?: Total Help needed climbing 3-5 steps with a railing? : Total 6 Click Score: 11    End of Session Equipment Utilized During Treatment: Gait belt Activity Tolerance: Patient limited by pain;Patient limited by fatigue Patient left: in bed;with call bell/phone within reach;with bed alarm set Nurse Communication: Mobility status PT Visit Diagnosis: Unsteadiness on feet (R26.81);Muscle weakness (generalized) (M62.81);Other abnormalities of gait and mobility (R26.89);Difficulty in walking, not elsewhere classified (R26.2);Pain Pain - Right/Left: Left Pain - part of body: Leg    Time: [id number] PT Time Calculation (min) (ACUTE ONLY): 30 min   Charges:   PT Evaluation $PT Eval Low Complexity: 1 Low          Acute Rehab  Pager: [phone]   Garwin Brothers, SPT 04/04/2021, 6:00 PM

## 2021-04-04 NOTE — Progress Notes (Signed)
Inpatient Rehab Admissions Coordinator:    I met with pt. To discuss potential CIR admission. Pt. Initially stated that he wants SNF, but daughter called back stating that Pt. Is unsure. Following discussion, pt. Wants to work with PT/OT before making a decision. Of note, current payor trends with [facility] medicare indicate that they will not approve CIR admission for amputations, so would have to plan to appeal if pt. Does want to pursue CIR. AC will follow up with pt. After he works with PT/OT.   Clemens Catholic, [place], [place] Admissions Coordinator  [phone] ([place]) [phone] (office)

## 2021-04-05 DIAGNOSIS — B9562 Methicillin resistant Staphylococcus aureus infection as the cause of diseases classified elsewhere: Secondary | ICD-10-CM | POA: Diagnosis not present

## 2021-04-05 DIAGNOSIS — Z992 Dependence on renal dialysis: Secondary | ICD-10-CM | POA: Diagnosis not present

## 2021-04-05 DIAGNOSIS — T8454XD Infection and inflammatory reaction due to internal left knee prosthesis, subsequent encounter: Secondary | ICD-10-CM

## 2021-04-05 DIAGNOSIS — N186 End stage renal disease: Secondary | ICD-10-CM | POA: Diagnosis not present

## 2021-04-05 DIAGNOSIS — R7881 Bacteremia: Secondary | ICD-10-CM | POA: Diagnosis not present

## 2021-04-05 LAB — RENAL FUNCTION PANEL
Albumin: 2.4 g/dL — ABNORMAL LOW (ref 3.5–5.0)
Anion gap: 12 (ref 5–15)
BUN: 104 mg/dL — ABNORMAL HIGH (ref 6–20)
CO2: 23 mmol/L (ref 22–32)
Calcium: 8.7 mg/dL — ABNORMAL LOW (ref 8.9–10.3)
Chloride: 94 mmol/L — ABNORMAL LOW (ref 98–111)
Creatinine, Ser: 4.39 mg/dL — ABNORMAL HIGH (ref 0.61–1.24)
GFR, Estimated: 15 mL/min — ABNORMAL LOW (ref >60)
Glucose, Bld: 156 mg/dL — ABNORMAL HIGH (ref 70–99)
Phosphorus: 6.6 mg/dL — ABNORMAL HIGH (ref 2.5–4.6)
Potassium: 3.7 mmol/L (ref 3.5–5.1)
Sodium: 129 mmol/L — ABNORMAL LOW (ref 135–145)

## 2021-04-05 LAB — GLUCOSE, CAPILLARY
Glucose-Capillary: 132 mg/dL — ABNORMAL HIGH (ref 70–99)
Glucose-Capillary: 187 mg/dL — ABNORMAL HIGH (ref 70–99)
Glucose-Capillary: 204 mg/dL — ABNORMAL HIGH (ref 70–99)
Glucose-Capillary: 141 mg/dL — ABNORMAL HIGH (ref 70–99)

## 2021-04-05 LAB — CBC
HCT: 14 % — ABNORMAL LOW (ref 39.0–52.0)
HCT: 16.4 % — ABNORMAL LOW (ref 39.0–52.0)
Hemoglobin: 4.5 g/dL — CL (ref 13.0–17.0)
Hemoglobin: 5.4 g/dL — CL (ref 13.0–17.0)
MCH: 29.2 pg (ref 26.0–34.0)
MCH: 28.1 pg (ref 26.0–34.0)
MCHC: 32.1 g/dL (ref 30.0–36.0)
MCHC: 32.9 g/dL (ref 30.0–36.0)
MCV: 90.9 fL (ref 80.0–100.0)
MCV: 85.4 fL (ref 80.0–100.0)
Platelets: 226 10*3/uL (ref 150–400)
Platelets: 195 10*3/uL (ref 150–400)
RBC: 1.54 MIL/uL — ABNORMAL LOW (ref 4.22–5.81)
RBC: 1.92 MIL/uL — ABNORMAL LOW (ref 4.22–5.81)
RDW: 16.5 % — ABNORMAL HIGH (ref 11.5–15.5)
RDW: 17.8 % — ABNORMAL HIGH (ref 11.5–15.5)
WBC: 11.1 10*3/uL — ABNORMAL HIGH (ref 4.0–10.5)
WBC: 11.1 10*3/uL — ABNORMAL HIGH (ref 4.0–10.5)
nRBC: 0.2 % (ref 0.0–0.2)
nRBC: 0.2 % (ref 0.0–0.2)

## 2021-04-05 LAB — ABO/RH: ABO/RH(D): A NEG

## 2021-04-05 LAB — PREPARE RBC (CROSSMATCH)

## 2021-04-05 MED ORDER — ALBUMIN HUMAN 25 % IV SOLN: 25.0000 g | Freq: Once | INTRAVENOUS | Status: AC

## 2021-04-05 MED ORDER — OXYCODONE HCL 5 MG PO TABS
ORAL_TABLET | ORAL | Status: AC
  Filled 2021-04-05: qty 1

## 2021-04-05 MED ORDER — INSULIN REGULAR HUMAN (CONC) 500 UNIT/ML ~~LOC~~ SOPN
45.0000 [IU] | PEN_INJECTOR | Freq: Every day | SUBCUTANEOUS | Status: DC
  Administered 2021-04-05: 45 [IU] via SUBCUTANEOUS

## 2021-04-05 MED ORDER — VANCOMYCIN HCL IN DEXTROSE 1-5 GM/200ML-% IV SOLN
INTRAVENOUS | Status: AC
  Administered 2021-04-05: 1000 mg via INTRAVENOUS
  Filled 2021-04-05: qty 200

## 2021-04-05 MED ORDER — SENNA 8.6 MG PO TABS
1.0000 | ORAL_TABLET | Freq: Every day | ORAL | Status: DC
  Administered 2021-04-05 – 2021-04-13 (×8): 8.6 mg via ORAL
  Filled 2021-04-05 (×8): qty 1

## 2021-04-05 MED ORDER — SODIUM CHLORIDE 0.9% IV SOLUTION: Freq: Once | INTRAVENOUS | Status: AC

## 2021-04-05 MED ORDER — INSULIN REGULAR HUMAN (CONC) 500 UNIT/ML ~~LOC~~ SOPN: 105.0000 [IU] | PEN_INJECTOR | Freq: Every day | SUBCUTANEOUS | Status: DC

## 2021-04-05 MED ORDER — INSULIN REGULAR HUMAN (CONC) 500 UNIT/ML ~~LOC~~ SOPN: 50.0000 [IU] | PEN_INJECTOR | Freq: Every day | SUBCUTANEOUS | Status: DC

## 2021-04-05 MED ORDER — ALBUMIN HUMAN 25 % IV SOLN
INTRAVENOUS | Status: AC
  Administered 2021-04-05: 25 g via INTRAVENOUS
  Filled 2021-04-05: qty 100

## 2021-04-05 MED ORDER — INSULIN REGULAR HUMAN (CONC) 500 UNIT/ML ~~LOC~~ SOPN
105.0000 [IU] | PEN_INJECTOR | Freq: Every day | SUBCUTANEOUS | Status: DC
  Administered 2021-04-05: 105 [IU] via SUBCUTANEOUS

## 2021-04-05 MED ORDER — HYDROMORPHONE HCL 1 MG/ML IJ SOLN
INTRAMUSCULAR | Status: AC
  Administered 2021-04-05: 0.5 mg via INTRAVENOUS
  Filled 2021-04-05: qty 0.5

## 2021-04-05 NOTE — Progress Notes (Addendum)
[facility] Progress Note   Subjective:   Seen in HD unit. HD just getting started. No new complaints this am. No cp, sob. Pain better, requesting his am Zoloft dose.   Objective Vitals:   04/04/21 1944 04/04/21 1959 04/05/21 0016 04/05/21 0359  BP: (!) 147/57  (!) 141/55 (!) 125/53  Pulse: 96  97 91  Resp: '20  20 20  '$ Temp: 98.6 F (37 C)  99.2 F (37.3 C) 98.6 F (37 C)  TempSrc: Oral  Oral Oral  SpO2: (!) 88% 97% 96% 98%  Weight:      Height:       Physical Exam General:Well appearing man, NAD.  Heart:RRR; no murmur Lungs:CTA anteriorly Abdomen:soft Extremities:L AKA bandaged with wound vac, no RLE edema Dialysis Access:RUE AVG + bruit  Additional Objective Labs: Basic Metabolic Panel: Recent Labs  Lab 03/30/21 0607 03/31/21 0500 04/02/21 0634 04/03/21 1226  NA 128* 125* 127* 129*  K 4.2 3.4* 4.0 4.9  CL 87* 88* 92* 99  CO2 '25 24 22  '$ --   GLUCOSE 311* 358* 290* 270*  BUN 66* 94* 79* 72*  CREATININE 4.51* 4.98* 4.03* 3.80*  CALCIUM 8.5* 8.5* 9.0  --   PHOS  --  6.8*  --   --    Liver Function Tests: Recent Labs  Lab 03/30/21 0607 03/31/21 0500  AST 31 19  ALT 21 19  ALKPHOS 63 63  BILITOT 0.7 0.7  PROT 7.7 7.2  ALBUMIN 3.3* 2.9*   CBC: Recent Labs  Lab 03/29/21 1050 03/30/21 0607 03/31/21 0500 04/02/21 0634 04/03/21 1226  WBC 12.6* 10.2 8.5 5.7  --   HGB 9.8* 10.4* 8.4* 9.4* 8.8*  HCT 30.5* 32.5* 25.6* 30.5* 26.0*  MCV 92.4 92.6 92.1 94.1  --   PLT 112* 90* 86* 91*  --    Studies/Results: No results found. Medications: . magnesium sulfate bolus IVPB    . vancomycin Stopped (03/31/21 1329)   . (feeding supplement) PROSource Plus  30 mL Oral BID BM  . aspirin  81 mg Oral Daily  . atorvastatin  40 mg Oral QHS  . calcium acetate  1,334 mg Oral TID with meals  . carvedilol  12.5 mg Oral BID WC  . Chlorhexidine Gluconate Cloth  6 each Topical Q0600  . docusate sodium  100 mg Oral Daily  . heparin  5,000 Units  Subcutaneous Q8H  . insulin aspart  0-15 Units Subcutaneous TID WC  . insulin aspart  0-5 Units Subcutaneous QHS  . insulin regular human CONCENTRATED  105 Units Subcutaneous Q breakfast   And  . insulin regular human CONCENTRATED  50 Units Subcutaneous Q supper  . mouth rinse  15 mL Mouth Rinse BID  . nortriptyline  10 mg Oral QHS  . pantoprazole  40 mg Oral Daily  . polyethylene glycol  17 g Oral BID  . sertraline  100 mg Oral Daily    Dialysis Orders: [facility] MWF  4h 121kg 3K/2.5 bath RUE AVG/ R IJ TDC Hep 4500+ 2544md -Mircera 100 ug q2 last 4/27  Assessment/Plan: 1. MRSA bacteremia: BCx 5/1 +, repeat BCx 5/3 negative. On Vancomycin.TDC removed 5/3, no need to replace as AVG in use. 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 2. ESRD:Continue HD per usual MWF schedule - next HD 5/9. 3. HD [facility] Had fistulogram 4/14 that showed patent AVG.TDC removed per IR 5/3, AVG in use.  4. HTN/ volume:BP stable, EDW will need to be lowered s/p AKA. 5. PTSD:Continue home meds. 6. COPD:Per primary. 7. DM2: Per primary, on insulin. 8. Anemiaof ESRD:Hgb trending down, last 9.4 and anticipating further post-op drop, transfuse prn. Due for next dose ESA on 5/11. 9. MBD:Ca ok,Phoshigh - Phoslo ^'d to 2/meals. No VDRA. 10. Nutrition: Alb low, continue supplements.   Lynnda Child PA-C [facility] 04/05/2021,7:58 AM  Seen and examined independently.  Agree with note and exam as documented above by physician extender and as noted here.  Seen and examined on HD at 8:16 am.  107/34 and HR 90. RUE AVG in use. Reduced UF by 0.5 kg and ordered albumin 25 gram IV 25%.    ESRD on HD p/w staph bacteremia. tunn catheter out and using graft. now s/p left AKA. Optimize volume with HD.  Hasn't had labs - these were just drawn with HD  General adult male in bed in no acute  distress HEENT normocephalic atraumatic extraocular movements intact sclera anicteric Neck supple trachea midline Lungs clear to auscultation bilaterally normal work of breathing at rest  Heart S1S2 no rub Abdomen soft nontender obese habitus Extremities trace nonpitting edema right leg and left AKA Psych normal mood and affect  Claudia Desanctis, MD 04/05/2021 8:19 AM

## 2021-04-05 NOTE — Progress Notes (Signed)
OT Cancellation Note  Patient Details Name: Jeffrey [name] MRN: [id number] DOB: Jun 26, 1961   Cancelled Treatment:    Reason Eval/Treat Not Completed: Patient at procedure or test/ unavailable Pt off unit for HD this AM. Will follow-up for OT eval as time allows.   Layla Maw 04/05/2021, 7:51 AM

## 2021-04-05 NOTE — Progress Notes (Signed)
Patient's hemoglobin is 4.5. Notified MD.

## 2021-04-05 NOTE — Progress Notes (Signed)
Cross-coverage note:   Patient had Hgb 4.5 today and was transfused 1 unit RBC. Initial attempt to draw blood for post-transfusion CBC was unsuccessful and patient refused additional attempts. Plan to transfuse a second unit RBC and attempt to check H&H after that.

## 2021-04-05 NOTE — Progress Notes (Signed)
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 Repeat blood culture from 5/4 remains without growth to date. Appears source control has been achieved. Unable to obtain TEE so will plan for treatment duration of 6 weeks of vancomycin with dialysis. End date of treatment will be 6/18. Continue wound care per orthopedics. Will arrange for telehealth visit in ID clinic as Jeffrey [name] discharge plan is back to [facility] area.   PLAN:  1. Continue vancomycin through 6/18 with dialysis. 2. Continue to watch cultures until finalized.  3. Wound care per orthopedic surgery.  4. Follow up in ID office for telehealth visit in 3 weeks.  ID will sign off. Please re-consult if needed.   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 (feeding supplement) PROSource Plus  30 mL Oral BID BM  . sodium chloride   Intravenous Once  . aspirin  81 mg Oral Daily  . atorvastatin  40 mg Oral QHS  . calcium acetate  1,334 mg Oral TID with meals  . carvedilol  12.5 mg Oral BID WC  . Chlorhexidine Gluconate Cloth  6 each Topical Q0600  . docusate sodium  100 mg Oral Daily  . heparin  5,000 Units Subcutaneous Q8H  . insulin aspart  0-15 Units Subcutaneous TID WC  . insulin aspart  0-5 Units Subcutaneous QHS  . insulin regular human CONCENTRATED  105 Units Subcutaneous  Q breakfast   And  . insulin regular human CONCENTRATED  50 Units Subcutaneous Q supper  . mouth rinse  15 mL Mouth Rinse BID  . nortriptyline  10 mg Oral QHS  . pantoprazole  40 mg Oral Daily  . polyethylene glycol  17 g Oral BID  . sertraline  100 mg Oral Daily    SUBJECTIVE:  Afebrile overnight with no acute events. Visited while on dialysis. No new concerns/complaints. Appreciative of the care he has received.   Allergies  Allergen Reactions  . Bupropion Swelling  . Dexmedetomidine Nausea And Vomiting    Other reaction(s): Other (See Comments) Dose-limiting bradycardia Dose-limiting bradycardia   . Ibuprofen Shortness Of Breath and Swelling    Pt tolerates aspirin Pt tolerates aspirin   . Pioglitazone Anaphylaxis and Rash  . Tomato Anaphylaxis     Review of Systems: Review of Systems  Constitutional: Negative for chills, fever and weight loss.  Respiratory: Negative for cough, shortness of breath and wheezing.   Cardiovascular: Negative for chest pain and leg swelling.  Gastrointestinal: Negative for abdominal pain, constipation, diarrhea, nausea and vomiting.  Skin: Negative for rash.      OBJECTIVE: Vitals:   04/05/21 0930 04/05/21 1000 04/05/21 1030 04/05/21 1100  BP: (!) 120/96 (!) 105/41 (!) 100/46 (!) 118/51  Pulse:      Resp:      Temp:  TempSrc:      SpO2:      Weight:      Height:       Body mass index is 43.07 kg/m.  Physical Exam Constitutional:      General: He is not in acute distress.    Appearance: He is well-developed.     Comments: Lying in bed with head of bed elevated; pleasant.   Cardiovascular:     Rate and Rhythm: Normal rate and regular rhythm.     Heart sounds: Normal heart sounds.  Pulmonary:     Effort: Pulmonary effort is normal.     Breath sounds: Normal breath sounds.  Musculoskeletal:     Comments: Wound vac on left stump.   Skin:    General: Skin is warm and dry.  Neurological:     Mental Status: He is  alert and oriented to person, place, and time.  Psychiatric:        Behavior: Behavior normal.        Thought Content: Thought content normal.        Judgment: Judgment normal.     Lab Results Lab Results  Component Value Date   WBC 5.7 04/02/2021   HGB 8.8 (L) 04/03/2021   HCT 26.0 (L) 04/03/2021   MCV 94.1 04/02/2021   PLT 91 (L) 04/02/2021    Lab Results  Component Value Date   CREATININE 4.39 (H) 04/05/2021   BUN 104 (H) 04/05/2021   NA 129 (L) 04/05/2021   K 3.7 04/05/2021   CL 94 (L) 04/05/2021   CO2 23 04/05/2021    Lab Results  Component Value Date   ALT 19 03/31/2021   AST 19 03/31/2021   ALKPHOS 63 03/31/2021   BILITOT 0.7 03/31/2021     Microbiology: Recent Results (from the past 240 hour(s))  Urine culture     Status: None   Collection Time: 03/28/21  9:49 PM   Specimen: In/Out Cath Urine  Result Value Ref Range Status   Specimen Description IN/OUT CATH URINE  Final   Special Requests NONE  Final   Culture   Final    NO GROWTH Performed at [facility], [address], [place],  [address]    Report Status 03/30/2021 FINAL  Final  Resp Panel by RT-PCR (Flu A&B, Covid) Nasopharyngeal Swab     Status: None   Collection Time: 03/28/21 10:35 PM   Specimen: Nasopharyngeal Swab; Nasopharyngeal(NP) swabs in vial transport medium  Result Value Ref Range Status   SARS Coronavirus 2 by RT PCR NEGATIVE NEGATIVE Final    Comment: (NOTE) SARS-CoV-2 target nucleic acids are NOT DETECTED.  The SARS-CoV-2 RNA is generally detectable in upper respiratory specimens during the acute phase of infection. The lowest concentration of SARS-CoV-2 viral copies this assay can detect is 138 copies/mL. A negative result does not preclude SARS-Cov-2 infection and should not be used as the sole basis for treatment or other patient management decisions. A negative result may occur with  improper specimen collection/handling, submission of specimen other than  nasopharyngeal swab, presence of viral mutation(s) within the areas targeted by this assay, and inadequate number of viral copies(<138 copies/mL). A negative result must be combined with clinical observations, patient history, and epidemiological information. The expected result is Negative.  Fact Sheet for Patients:  EntrepreneurPulse.com.au  Fact Sheet for Healthcare Providers:  IncredibleEmployment.be  This test is no t yet approved or cleared by the [place] and  has been authorized for  detection and/or diagnosis of SARS-CoV-2 by FDA under an Emergency Use Authorization (EUA). This EUA will remain  in effect (meaning this test can be used) for the duration of the COVID-19 declaration under Section 564(b)(1) of the Act, 21 U.S.C.section 360bbb-3(b)(1), unless the authorization is terminated  or revoked sooner.       Influenza A by PCR NEGATIVE NEGATIVE Final   Influenza B by PCR NEGATIVE NEGATIVE Final    Comment: (NOTE) The Xpert Xpress SARS-CoV-2/FLU/RSV plus assay is intended as an aid in the diagnosis of influenza from Nasopharyngeal swab specimens and should not be used as a sole basis for treatment. Nasal washings and aspirates are unacceptable for Xpert Xpress SARS-CoV-2/FLU/RSV testing.  Fact Sheet for Patients: EntrepreneurPulse.com.au  Fact Sheet for Healthcare Providers: IncredibleEmployment.be  This test is not yet approved or cleared by the [place] FDA and has been authorized for detection and/or diagnosis of SARS-CoV-2 by FDA under an Emergency Use Authorization (EUA). This EUA will remain in effect (meaning this test can be used) for the duration of the COVID-19 declaration under Section 564(b)(1) of the Act, 21 U.S.C. section 360bbb-3(b)(1), unless the authorization is terminated or revoked.  Performed at [facility], [place] [address] [place], [place] [address]    Blood Culture (routine x 2)     Status: Abnormal   Collection Time: 03/28/21 11:30 PM   Specimen: BLOOD LEFT FOREARM  Result Value Ref Range Status   Specimen Description BLOOD LEFT FOREARM  Final   Special Requests   Final    BOTTLES DRAWN AEROBIC AND ANAEROBIC Blood Culture results may not be optimal due to an inadequate volume of blood received in culture bottles   Culture  Setup Time   Final    GRAM POSITIVE COCCI IN CLUSTERS IN BOTH AEROBIC AND ANAEROBIC BOTTLES CRITICAL RESULT CALLED TO, READ BACK BY AND VERIFIED WITH: Datto [id number] FCP Performed at [facility], [place] [address], [place],  [address]    Culture METHICILLIN RESISTANT STAPHYLOCOCCUS AUREUS (A)  Final   Report Status 03/31/2021 FINAL  Final   Organism ID, Bacteria METHICILLIN RESISTANT STAPHYLOCOCCUS AUREUS  Final      Susceptibility   Methicillin resistant staphylococcus aureus - MIC*    CIPROFLOXACIN >=8 RESISTANT Resistant     ERYTHROMYCIN >=8 RESISTANT Resistant     GENTAMICIN <=0.5 SENSITIVE Sensitive     OXACILLIN >=4 RESISTANT Resistant     TETRACYCLINE <=1 SENSITIVE Sensitive     VANCOMYCIN <=0.5 SENSITIVE Sensitive     TRIMETH/SULFA <=10 SENSITIVE Sensitive     CLINDAMYCIN <=0.25 SENSITIVE Sensitive     RIFAMPIN <=0.5 SENSITIVE Sensitive     Inducible Clindamycin NEGATIVE Sensitive     * METHICILLIN RESISTANT STAPHYLOCOCCUS AUREUS  Blood Culture ID Panel (Reflexed)     Status: Abnormal   Collection Time: 03/28/21 11:30 PM  Result Value Ref Range Status   Enterococcus faecalis NOT DETECTED NOT DETECTED Final   Enterococcus Faecium NOT DETECTED NOT DETECTED Final   Listeria monocytogenes NOT DETECTED NOT DETECTED Final   Staphylococcus species DETECTED (A) NOT DETECTED Final    Comment: CRITICAL RESULT CALLED TO, READ BACK BY AND VERIFIED WITH: PHARMD HALEY V. [id number] FCP    Staphylococcus aureus (BCID) DETECTED (A) NOT DETECTED Final    Comment: Methicillin  (oxacillin)-resistant Staphylococcus aureus (MRSA). MRSA is predictably resistant to beta-lactam antibiotics (except ceftaroline). Preferred therapy is vancomycin unless clinically contraindicated. Patient requires contact precautions if  hospitalized. CRITICAL  RESULT CALLED TO, READ BACK BY AND VERIFIED WITH: PHARMD HALEY V. [id number] FCP    Staphylococcus epidermidis NOT DETECTED NOT DETECTED Final   Staphylococcus lugdunensis NOT DETECTED NOT DETECTED Final   Streptococcus species NOT DETECTED NOT DETECTED Final   Streptococcus agalactiae NOT DETECTED NOT DETECTED Final   Streptococcus pneumoniae NOT DETECTED NOT DETECTED Final   Streptococcus pyogenes NOT DETECTED NOT DETECTED Final   A.calcoaceticus-baumannii NOT DETECTED NOT DETECTED Final   Bacteroides fragilis NOT DETECTED NOT DETECTED Final   Enterobacterales NOT DETECTED NOT DETECTED Final   Enterobacter cloacae complex NOT DETECTED NOT DETECTED Final   Escherichia coli NOT DETECTED NOT DETECTED Final   Klebsiella aerogenes NOT DETECTED NOT DETECTED Final   Klebsiella oxytoca NOT DETECTED NOT DETECTED Final   Klebsiella pneumoniae NOT DETECTED NOT DETECTED Final   Proteus species NOT DETECTED NOT DETECTED Final   Salmonella species NOT DETECTED NOT DETECTED Final   Serratia marcescens NOT DETECTED NOT DETECTED Final   Haemophilus influenzae NOT DETECTED NOT DETECTED Final   Neisseria meningitidis NOT DETECTED NOT DETECTED Final   Pseudomonas aeruginosa NOT DETECTED NOT DETECTED Final   Stenotrophomonas maltophilia NOT DETECTED NOT DETECTED Final   Candida albicans NOT DETECTED NOT DETECTED Final   Candida auris NOT DETECTED NOT DETECTED Final   Candida glabrata NOT DETECTED NOT DETECTED Final   Candida krusei NOT DETECTED NOT DETECTED Final   Candida parapsilosis NOT DETECTED NOT DETECTED Final   Candida tropicalis NOT DETECTED NOT DETECTED Final   Cryptococcus neoformans/gattii NOT DETECTED NOT DETECTED Final   Meth  resistant mecA/C and MREJ DETECTED (A) NOT DETECTED Final    Comment: CRITICAL RESULT CALLED TO, READ BACK BY AND VERIFIED WITH: Jonn Shingles [id number] FCP Performed at [facility], [address] [place], [place] [address]   Body fluid culture w Gram Stain     Status: None   Collection Time: 03/29/21 12:10 AM   Specimen: Body Fluid  Result Value Ref Range Status   Specimen Description FLUID LEFT KNEE  Final   Special Requests SYRINGE  Final   Gram Stain FEW WBC SEEN NO ORGANISMS SEEN   Final   Culture   Final    NO GROWTH 3 DAYS Performed at [facility], [address] [place], [place] [address]    Report Status 04/01/2021 FINAL  Final  Blood Culture (routine x 2)     Status: None   Collection Time: 03/29/21  3:00 AM   Specimen: BLOOD  Result Value Ref Range Status   Specimen Description BLOOD SITE NOT SPECIFIED  Final   Special Requests   Final    BOTTLES DRAWN AEROBIC AND ANAEROBIC Blood Culture adequate volume   Culture   Final    NO GROWTH 5 DAYS Performed at [facility], [place] [address], [place], [place] [address]    Report Status 04/03/2021 FINAL  Final  Culture, blood (Routine X 2) w Reflex to ID Panel     Status: None (Preliminary result)   Collection Time: 03/31/21  7:00 AM   Specimen: BLOOD RIGHT ARM  Result Value Ref Range Status   Specimen Description BLOOD RIGHT ARM  Final   Special Requests   Final    BOTTLES DRAWN AEROBIC AND ANAEROBIC Blood Culture adequate volume   Culture   Final    NO GROWTH 3 DAYS Performed at [facility], [place] [address], [place], [place] [address]    Report  Status PENDING  Incomplete  Culture, blood (Routine X 2) w Reflex to ID Panel     Status: None (Preliminary result)   Collection Time: 03/31/21  7:00 AM   Specimen: BLOOD RIGHT ARM  Result Value Ref Range Status   Specimen Description BLOOD RIGHT ARM  Final   Special Requests   Final    BOTTLES DRAWN AEROBIC AND ANAEROBIC Blood Culture  adequate volume   Culture   Final    NO GROWTH 3 DAYS Performed at [facility], [address] [place], [place] [address]    Report Status PENDING  Incomplete     Terri Piedra, [place] [facility]  04/05/2021  11:19 AM

## 2021-04-05 NOTE — NC FL2 (Signed)
[place] MEDICAID FL2 LEVEL OF CARE SCREENING TOOL     IDENTIFICATION  Patient Name: Jeffrey [name] Birthdate: 1961-02-08 Sex: male Admission Date (Current Location): 03/28/2021  [facility] and [place] Number:  Herbalist and Address:  [facility], [place] [address], [place], [place] [address]      Provider Number: [id number]  Attending Physician Name and Address:  Elmarie Shiley, MD  Relative Name and Phone Number:       Current Level of Care: Hospital Recommended Level of Care: [place] Prior Approval Number:    Date Approved/Denied:   PASRR Number: [id number]  Discharge Plan: SNF    Current Diagnoses: Patient Active Problem List   Diagnosis Date Noted  . Infection of prosthetic left knee joint ([place])   . MRSA bacteremia 03/30/2021  . Left knee pain   . 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 Fever 03/29/2021  . ESRD (end stage renal disease) ([place]) 03/29/2021  . COPD (chronic obstructive pulmonary disease) ([place]) 03/29/2021  . Hypertension complicating diabetes ([place]) 03/29/2021  . Type 2 diabetes mellitus with hyperlipidemia ([place]) 03/29/2021  . PTSD (post-traumatic stress disorder) 03/29/2021  . CHF (congestive heart failure) ([place]) 03/29/2021    Orientation RESPIRATION BLADDER Height & Weight     Self,Time,Situation  Normal Continent Weight: 258 lb 13.1 oz (117.4 kg) Height:  '5\' 5"'$  (165.1 cm)  BEHAVIORAL SYMPTOMS/MOOD NEUROLOGICAL BOWEL NUTRITION STATUS      Continent Diet (Please see DC Summary)  AMBULATORY STATUS COMMUNICATION OF NEEDS Skin   Extensive Assist Verbally Surgical wounds,Wound Vac (Closed incision on leg with prevena wound vac (for five days only))                       Personal Care Assistance Level of Assistance  Bathing,Feeding,Dressing Bathing Assistance: Maximum assistance Feeding assistance: Independent Dressing Assistance: Limited assistance     Functional  Limitations Info             SPECIAL CARE FACTORS FREQUENCY  PT (By licensed PT),OT (By licensed OT)     PT Frequency: 5x/week OT Frequency: 5x/week            Contractures Contractures Info: Not present    Additional Factors Info  Code Status,Allergies,Psychotropic,Insulin Sliding Scale Code Status Info: Full Allergies Info: Bupropion, Dexmedetomidine, Ibuprofen, Pioglitazone, Tomato Psychotropic Info: Zoloft Insulin Sliding Scale Info: see dc summary       Current Medications (04/05/2021):  This is the current hospital active medication list Current Facility-Administered Medications  Medication Dose Route Frequency Provider Last Rate Last Admin  . (feeding supplement) PROSource Plus liquid 30 mL  30 mL Oral BID BM Erle Crocker, MD   30 mL at 04/05/21 1309  . acetaminophen (TYLENOL) tablet 650 mg  650 mg Oral Q6H PRN Erle Crocker, MD   650 mg at 04/01/21 2015   Or  . acetaminophen (TYLENOL) suppository 650 mg  650 mg Rectal Q6H PRN Erle Crocker, MD      . atorvastatin (LIPITOR) tablet 40 mg  40 mg Oral QHS Erle Crocker, MD   40 mg at 04/04/21 2132  . calcium acetate (PHOSLO) capsule 1,334 mg  1,334 mg Oral TID with meals Erle Crocker, MD   1,334 mg at 04/05/21 1331  . carvedilol (COREG) tablet 12.5 mg  12.5 mg Oral BID WC Erle Crocker, MD   12.5 mg at 04/05/21 1331  . Chlorhexidine  Gluconate Cloth 2 % PADS 6 each  6 each Topical Q0600 Erle Crocker, MD   6 each at 04/05/21 0503  . diazepam (VALIUM) tablet 2 mg  2 mg Oral Q12H PRN Erle Crocker, MD      . docusate sodium (COLACE) capsule 100 mg  100 mg Oral Daily Erle Crocker, MD   100 mg at 04/05/21 1308  . guaiFENesin-dextromethorphan (ROBITUSSIN DM) 100-10 MG/5ML syrup 15 mL  15 mL Oral Q4H PRN Erle Crocker, MD      . hydrALAZINE (APRESOLINE) injection 5 mg  5 mg Intravenous Q20 Min PRN Erle Crocker, MD      . HYDROmorphone (DILAUDID)  injection 0.5 mg  0.5 mg Intravenous Q4H PRN Erle Crocker, MD   0.5 mg at 04/05/21 0941  . insulin aspart (novoLOG) injection 0-15 Units  0-15 Units Subcutaneous TID WC Erle Crocker, MD   3 Units at 04/05/21 1329  . insulin aspart (novoLOG) injection 0-5 Units  0-5 Units Subcutaneous QHS Erle Crocker, MD   2 Units at 04/04/21 2129  . insulin regular human CONCENTRATED (HUMULIN R) 500 UNIT/ML kwikpen 105 Units  105 Units Subcutaneous Q breakfast Regalado, Belkys A, MD   105 Units at 04/05/21 1330   And  . insulin regular human CONCENTRATED (HUMULIN R) 500 UNIT/ML kwikpen 50 Units  50 Units Subcutaneous Q supper Regalado, Belkys A, MD      . labetalol (NORMODYNE) injection 10 mg  10 mg Intravenous Q10 min PRN Erle Crocker, MD      . lidocaine (PF) (XYLOCAINE) 1 % injection   Infiltration PRN Han, Aimee H, PA-C   10 mL at 03/30/21 1020  . magnesium sulfate IVPB 2 g 50 mL  2 g Intravenous Daily PRN Erle Crocker, MD      . MEDLINE mouth rinse  15 mL Mouth Rinse BID Erle Crocker, MD   15 mL at 04/04/21 [id number]  . metoprolol tartrate (LOPRESSOR) injection 2-5 mg  2-5 mg Intravenous Q2H PRN Erle Crocker, MD      . naloxone [facility]) injection 0.1 mg  0.1 mg Intravenous PRN Erle Crocker, MD   0.1 mg at 04/02/21 0330  . nortriptyline (PAMELOR) capsule 10 mg  10 mg Oral QHS Erle Crocker, MD   10 mg at 04/04/21 2131  . ondansetron (ZOFRAN) injection 4 mg  4 mg Intravenous Q6H PRN Erle Crocker, MD      . oxyCODONE (Oxy IR/ROXICODONE) immediate release tablet 5 mg  5 mg Oral Q6H PRN Erle Crocker, MD   5 mg at 04/05/21 1325  . pantoprazole (PROTONIX) EC tablet 40 mg  40 mg Oral Daily Erle Crocker, MD   40 mg at 04/05/21 1308  . phenol (CHLORASEPTIC) mouth spray 1 spray  1 spray Mouth/Throat PRN Erle Crocker, MD      . polyethylene glycol (MIRALAX / GLYCOLAX) packet 17 g  17 g Oral BID Regalado, Belkys A, MD   17 g  at 04/05/21 1309  . senna (SENOKOT) tablet 8.6 mg  1 tablet Oral Daily Regalado, Belkys A, MD   8.6 mg at 04/05/21 1336  . sertraline (ZOLOFT) tablet 100 mg  100 mg Oral Daily Erle Crocker, MD   100 mg at 04/05/21 1309  . sodium chloride (OCEAN) 0.65 % nasal spray 1 spray  1 spray Each Nare PRN Opyd, Ilene Qua, MD      . sorbitol  70 % solution 30 mL  30 mL Oral Daily PRN Regalado, Belkys A, MD      . traZODone (DESYREL) tablet 150 mg  150 mg Oral QHS PRN Erle Crocker, MD      . vancomycin (VANCOCIN) IVPB 1000 mg/200 mL premix  1,000 mg Intravenous Q M,W,F-HD Erle Crocker, MD   Stopped at 04/05/21 1332     Discharge Medications: Please see discharge summary for a list of discharge medications.  Relevant Imaging Results:  Relevant Lab Results:   Additional Information SSN: [id number]. [place] COVID-19 Vaccine 01/04/2021, Moderna 03/11/20, Moderna 02/12/20. Continue vancomycin through 6/18 with dialysis. Requires OP Dialysis transport to [facility] MWF 6:30am  Benard Halsted, LCSW

## 2021-04-05 NOTE — Progress Notes (Addendum)
PROGRESS NOTE    Jeffrey [name]  HWE:[id number] DOB: 1961/09/30 DOA: 03/28/2021 PCP: Pcp, No   Brief Narrative: 60 year old with past medical history significant for ESRD,  diabetes presents to the ED complaining of fever.  Accompanied by vomiting, diaphoresis and mild confusion.  He has been having left knee pain.  Patient has a history of chronic knee  pain and swelling left knee, prior imagine at [facility].  Patient has a history of knee replacement/hardware bilaterally.  In the ED left knee imaging showed massive knee joint effusion.  Arthrocentesis was performed however only 1 cc of gelatinous red fluid was obtained by ED provider.  Patient was started empirically on IV antibiotics.  Patient was evaluated by orthopedic, who is recommending AKA>  Patient also diagnosed with MRSA bacteremia, underwent hemodialysis catheter removal.  Currently has RUE AVG (01/2021).  ID and orthopedic consulted. Underwent Left AKA by Dr Lucia Gaskins  on 5/7. Patient decline TEE. ID recommends IV Vancomycin through June 18 with HD.   Likely Acute blood loss anemia, post surgery. Patient refuse labs 04/04/2021. Labs check today showed Hb 4.5. he will received 1 units PRBC. Plan to repeat CBC post transfusion.      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 -Need repeat blood cultures to document clearance, cultures ordered by ID. ID will provide blood culture kit to HD units for blood culture to be drawn.  -2D ECHo  calcification mitral and aortic valve, ID recommend TEE. --He was schedule for TEE, but he refuses. -ID recommend IV vancomycin with HD through June 18. Follow up with ID in 3 weeks.    2-Left prosthetic knee septic arthritis: Left chronic infected total knee arthroplasty Arthrocentesis performed by ED only 1 cc gelatinous red fluid obtained.  Culture from arthrocentesis: no Growth to date.  CT; Irregular lucency around the tibial processes which may reflect hardware loosening or infection.  Not a specific moderate sized knee joint effusion. -Left AKA by Dr Lucia Gaskins  on 5/7.  -He has wound vac, need it for 5 days.   3-Diabetes type 2: Increase to Humulin R 500--105 units in am and  Keep 45  units PM the same.   Continue with  sliding scale insulin.  4-Acute blood loss Anemia;  Post op.  No evidence of active bleeding.  2 units PRBC ordered. Plan to repeat Hb post first unit of blood transfusion, to determine if he will need more blood.   Severe Sepsis: Secondary to septic arthritis, patient presents with Fever, tachycardia, tachypnea.  Found to have MRSA Bacteremia.  Needs TEE> he does not want to talk about TEE Treated with IV antibiotics.   5-ESRD on HD;  HD catheter removed.  Appreciate nephrology assistance.   6-Hypersomnolence: Resolved.  Gabapentin, Nortriptyline trazodone, valium on hold.  -Resume Nortriptyline and valium lower dose PRN 5/04 -Resume trazadone 5/05.  - MRI brain no acute findings -likely narcotic related, Dilaudid  dose decreased.  7-Thrombocytopenia; secondary to infectious process. Monitor.  8-HTN; Continue with coreg, amlodipine.  Hyponatremia; correction with HD>   Constipation; Miralax schedule. Add senna     Estimated body mass index is 43.07 kg/m as calculated from the following:   Height as of this encounter: _0  (1.651 m).   Weight as of this encounter: 117.4 kg.   DVT prophylaxis: Heparin  Code Status: Full code Family  Communication: Care discussed with patient . Disposition Plan:  Status is: Inpatient  Remains inpatient appropriate because:IV treatments appropriate due to intensity of illness or inability to take PO   Dispo: The patient is from: Home              Anticipated d/c is to: to be determine              Patient currently is not medically stable to d/c.   Difficult to place patient No        Consultants:   ID  Nephrology  Orthopoedic  Procedures:   HD  Left AKA on 5/7  Antimicrobials:  Vancomycin 03/29/2021  Subjective: He is calm today.  He is complaning of pain, leg.  No BM for several days.  Agrees to get labs after blood transfusion.   Objective: Vitals:   04/05/21 1000 04/05/21 1030 04/05/21 1100 04/05/21 1328  BP: (!) 105/41 (!) 100/46 (!) 118/51 (!) 137/54  Pulse:    98  Resp:      Temp:    98.6 F (37 C)  TempSrc:    Oral  SpO2:    97%  Weight:      Height:        Intake/Output Summary (Last 24 hours) at 04/05/2021 1428 Last data filed at 04/05/2021 0400 Gross per 24 hour  Intake 237 ml  Output 475 ml  Net -238 ml   Filed Weights   04/02/21 1043 04/03/21 1019 04/05/21 0755  Weight: 122.9 kg 122.9 kg 117.4 kg    Examination:  General exam:NAD Respiratory system: CTA Cardiovascular system: S 1, S 2 RRR Gastrointestinal system: BS present, soft, nt Central nervous system: Alert Extremities: S/P AKA, wound vac in place.     Data Reviewed: I have personally reviewed following labs and imaging studies  CBC: Recent Labs  Lab 03/30/21 0607 03/31/21 0500 04/02/21 0634 04/03/21 1226 04/05/21 1030  WBC 10.2 8.5 5.7  --  11.1*  HGB 10.4* 8.4* 9.4* 8.8* 4.5*  HCT 32.5* 25.6* 30.5* 26.0* 14.0*  MCV 92.6 92.1 94.1  --  90.9  PLT 90* 86* 91*  --  267   Basic Metabolic Panel: Recent Labs  Lab 03/30/21 0607 03/31/21 0500 04/02/21 0634 04/03/21 1226 04/05/21 0822  NA 128* 125* 127* 129* 129*  K 4.2 3.4* 4.0 4.9 3.7  CL 87* 88* 92* 99  94*  CO2 _1 --  23  GLUCOSE 311* 358* 290* 270* 156*  BUN 66* 94* 79* 72* 104*  CREATININE 4.51* 4.98* 4.03* 3.80* 4.39*  CALCIUM 8.5* 8.5* 9.0  --  8.7*  PHOS  --  6.8*  --   --  6.6*   GFR: Estimated Creatinine Clearance: 21.2 mL/min (A) (by C-G formula based on SCr of 4.39 mg/dL (H)). Liver Function Tests: Recent Labs  Lab 03/30/21 0607 03/31/21 0500 04/05/21 0822  AST 31 19  --   ALT 21 19  --   ALKPHOS 63 63  --   BILITOT 0.7 0.7  --   PROT 7.7 7.2  --   ALBUMIN 3.3* 2.9* 2.4*   No results for input(s):  LIPASE, AMYLASE in the last 168 hours. No results for input(s): AMMONIA in the last 168 hours. Coagulation Profile: No results for input(s): INR, PROTIME in the last 168 hours. Cardiac Enzymes: No results for input(s): CKTOTAL, CKMB, CKMBINDEX, TROPONINI in the last 168 hours. BNP (last 3 results) No results for input(s): PROBNP in the last 8760 hours. HbA1C: No results for input(s): HGBA1C in the last 72 hours. CBG: Recent Labs  Lab 04/04/21 1228 04/04/21 1649 04/04/21 2057 04/05/21 0936 04/05/21 1328  GLUCAP 328* 307* 245* 132* 187*   Lipid Profile: No results for input(s): CHOL, HDL, LDLCALC, TRIG, CHOLHDL, LDLDIRECT in the last 72 hours. Thyroid Function Tests: No results for input(s): TSH, T4TOTAL, FREET4, T3FREE, THYROIDAB in the last 72 hours. Anemia Panel: No results for input(s): VITAMINB12, FOLATE, FERRITIN, TIBC, IRON, RETICCTPCT in the last 72 hours. Sepsis Labs: No results for input(s): PROCALCITON, LATICACIDVEN in the last 168 hours.  Recent Results (from the past 240 hour(s))  Urine culture     Status: None   Collection Time: 03/28/21  9:49 PM   Specimen: In/Out Cath Urine  Result Value Ref Range Status   Specimen Description IN/OUT CATH URINE  Final   Special Requests NONE  Final   Culture   Final    NO GROWTH Performed at [facility], [address], [place], [place] [address]    Report Status 03/30/2021 FINAL  Final   Resp Panel by RT-PCR (Flu A&B, Covid) Nasopharyngeal Swab     Status: None   Collection Time: 03/28/21 10:35 PM   Specimen: Nasopharyngeal Swab; Nasopharyngeal(NP) swabs in vial transport medium  Result Value Ref Range Status   SARS Coronavirus 2 by RT PCR NEGATIVE NEGATIVE Final    Comment: (NOTE) SARS-CoV-2 target nucleic acids are NOT DETECTED.  The SARS-CoV-2 RNA is generally detectable in upper respiratory specimens during the acute phase of infection. The lowest concentration of SARS-CoV-2 viral copies this assay can detect is 138 copies/mL. A negative result does not preclude SARS-Cov-2 infection and should not be used as the sole basis for treatment or other patient management decisions. A negative result may occur with  improper specimen collection/handling, submission of specimen other than nasopharyngeal swab, presence of viral mutation(s) within the areas targeted by this assay, and inadequate number of viral copies(<138 copies/mL). A negative result must be combined with clinical observations, patient history, and epidemiological information. The expected result is Negative.  Fact Sheet for Patients:  EntrepreneurPulse.com.au  Fact Sheet for Healthcare Providers:  IncredibleEmployment.be  This test is no t yet approved or cleared by the [place] FDA and  has been authorized for detection and/or diagnosis of SARS-CoV-2 by FDA under an Emergency Use Authorization (EUA). This EUA will remain  in effect (meaning this test can be used) for the duration of the COVID-19 declaration under Section 564(b)(1) of the Act, 21 U.S.C.section 360bbb-3(b)(1), unless the authorization is terminated  or revoked sooner.       Influenza A by PCR NEGATIVE NEGATIVE Final   Influenza B by PCR NEGATIVE NEGATIVE Final    Comment: (NOTE) The Xpert Xpress SARS-CoV-2/FLU/RSV plus assay is intended as an aid in the diagnosis of influenza from  Nasopharyngeal swab specimens and should not be used as a sole basis for treatment. Nasal washings and aspirates are unacceptable for Xpert Xpress SARS-CoV-2/FLU/RSV testing.  Fact Sheet for Patients: EntrepreneurPulse.com.au  Fact Sheet for Healthcare Providers: IncredibleEmployment.be  This test is not yet approved or cleared by the [place]  FDA and has been authorized for detection and/or diagnosis of SARS-CoV-2 by FDA under an Emergency Use Authorization (EUA). This EUA will remain in effect (meaning this test can be used) for the duration of the COVID-19 declaration under Section 564(b)(1) of the Act, 21 U.S.C. section 360bbb-3(b)(1), unless the authorization is terminated or revoked.  Performed at [facility], [place] [address], [place], [place] [address]   Blood Culture (routine x 2)     Status: Abnormal   Collection Time: 03/28/21 11:30 PM   Specimen: BLOOD LEFT FOREARM  Result Value Ref Range Status   Specimen Description BLOOD LEFT FOREARM  Final   Special Requests   Final    BOTTLES DRAWN AEROBIC AND ANAEROBIC Blood Culture results may not be optimal due to an inadequate volume of blood received in culture bottles   Culture  Setup Time   Final    GRAM POSITIVE COCCI IN CLUSTERS IN BOTH AEROBIC AND ANAEROBIC BOTTLES CRITICAL RESULT CALLED TO, READ BACK BY AND VERIFIED WITH: [place] [id number] FCP Performed at [facility], [place] [address], [place], [place] [address]    Culture METHICILLIN RESISTANT STAPHYLOCOCCUS AUREUS (A)  Final   Report Status 03/31/2021 FINAL  Final   Organism ID, Bacteria METHICILLIN RESISTANT STAPHYLOCOCCUS AUREUS  Final      Susceptibility   Methicillin resistant staphylococcus aureus - MIC*    CIPROFLOXACIN >=8 RESISTANT Resistant     ERYTHROMYCIN >=8 RESISTANT Resistant     GENTAMICIN <=0.5 SENSITIVE Sensitive     OXACILLIN >=4 RESISTANT Resistant     TETRACYCLINE <=1 SENSITIVE  Sensitive     VANCOMYCIN <=0.5 SENSITIVE Sensitive     TRIMETH/SULFA <=10 SENSITIVE Sensitive     CLINDAMYCIN <=0.25 SENSITIVE Sensitive     RIFAMPIN <=0.5 SENSITIVE Sensitive     Inducible Clindamycin NEGATIVE Sensitive     * METHICILLIN RESISTANT STAPHYLOCOCCUS AUREUS  Blood Culture ID Panel (Reflexed)     Status: Abnormal   Collection Time: 03/28/21 11:30 PM  Result Value Ref Range Status   Enterococcus faecalis NOT DETECTED NOT DETECTED Final   Enterococcus Faecium NOT DETECTED NOT DETECTED Final   Listeria monocytogenes NOT DETECTED NOT DETECTED Final   Staphylococcus species DETECTED (A) NOT DETECTED Final    Comment: CRITICAL RESULT CALLED TO, READ BACK BY AND VERIFIED WITH: PHARMD HALEY V. [phone] FCP    Staphylococcus aureus (BCID) DETECTED (A) NOT DETECTED Final    Comment: Methicillin (oxacillin)-resistant Staphylococcus aureus (MRSA). MRSA is predictably resistant to beta-lactam antibiotics (except ceftaroline). Preferred therapy is vancomycin unless clinically contraindicated. Patient requires contact precautions if  hospitalized. CRITICAL RESULT CALLED TO, READ BACK BY AND VERIFIED WITH: PHARMD HALEY V. [id number] [phone] FCP    Staphylococcus epidermidis NOT DETECTED NOT DETECTED Final   Staphylococcus lugdunensis NOT DETECTED NOT DETECTED Final   Streptococcus species NOT DETECTED NOT DETECTED Final   Streptococcus agalactiae NOT DETECTED NOT DETECTED Final   Streptococcus pneumoniae NOT DETECTED NOT DETECTED Final   Streptococcus pyogenes NOT DETECTED NOT DETECTED Final   A.calcoaceticus-baumannii NOT DETECTED NOT DETECTED Final   Bacteroides fragilis NOT DETECTED NOT DETECTED Final   Enterobacterales NOT DETECTED NOT DETECTED Final   Enterobacter cloacae complex NOT DETECTED NOT DETECTED Final   Escherichia coli NOT DETECTED NOT DETECTED Final   Klebsiella aerogenes NOT DETECTED NOT DETECTED Final   Klebsiella oxytoca NOT DETECTED NOT DETECTED Final   Klebsiella  pneumoniae NOT DETECTED NOT DETECTED Final   Proteus species NOT  DETECTED NOT DETECTED Final   Salmonella species NOT DETECTED NOT DETECTED Final   Serratia marcescens NOT DETECTED NOT DETECTED Final   Haemophilus influenzae NOT DETECTED NOT DETECTED Final   Neisseria meningitidis NOT DETECTED NOT DETECTED Final   Pseudomonas aeruginosa NOT DETECTED NOT DETECTED Final   Stenotrophomonas maltophilia NOT DETECTED NOT DETECTED Final   Candida albicans NOT DETECTED NOT DETECTED Final   Candida auris NOT DETECTED NOT DETECTED Final   Candida glabrata NOT DETECTED NOT DETECTED Final   Candida krusei NOT DETECTED NOT DETECTED Final   Candida parapsilosis NOT DETECTED NOT DETECTED Final   Candida tropicalis NOT DETECTED NOT DETECTED Final   Cryptococcus neoformans/gattii NOT DETECTED NOT DETECTED Final   Meth resistant mecA/C and MREJ DETECTED (A) NOT DETECTED Final    Comment: CRITICAL RESULT CALLED TO, READ BACK BY AND VERIFIED WITH: Jonn Shingles [id number] [phone] FCP Performed at [facility], [place] [address], [place], [place] [address]   Body fluid culture w Gram Stain     Status: None   Collection Time: 03/29/21 12:10 AM   Specimen: Body Fluid  Result Value Ref Range Status   Specimen Description FLUID LEFT KNEE  Final   Special Requests SYRINGE  Final   Gram Stain FEW WBC SEEN NO ORGANISMS SEEN   Final   Culture   Final    NO GROWTH 3 DAYS Performed at [facility], [address], [place], [place] [address]    Report Status 04/01/2021 FINAL  Final  Blood Culture (routine x 2)     Status: None   Collection Time: 03/29/21  3:00 AM   Specimen: BLOOD  Result Value Ref Range Status   Specimen Description BLOOD SITE NOT SPECIFIED  Final   Special Requests   Final    BOTTLES DRAWN AEROBIC AND ANAEROBIC Blood Culture adequate volume   Culture   Final    NO GROWTH 5 DAYS Performed at [facility], [place] [address] [place], [place] [address]    Report Status  04/03/2021 FINAL  Final  Culture, blood (Routine X 2) w Reflex to ID Panel     Status: None (Preliminary result)   Collection Time: 03/31/21  7:00 AM   Specimen: BLOOD RIGHT ARM  Result Value Ref Range Status   Specimen Description BLOOD RIGHT ARM  Final   Special Requests   Final    BOTTLES DRAWN AEROBIC AND ANAEROBIC Blood Culture adequate volume   Culture   Final    NO GROWTH 3 DAYS Performed at [facility], [place] [address] [place], [place] [address]    Report Status PENDING  Incomplete  Culture, blood (Routine X 2) w Reflex to ID Panel     Status: None (Preliminary result)   Collection Time: 03/31/21  7:00 AM   Specimen: BLOOD RIGHT ARM  Result Value Ref Range Status   Specimen Description BLOOD RIGHT ARM  Final   Special Requests   Final    BOTTLES DRAWN AEROBIC AND ANAEROBIC Blood Culture adequate volume   Culture   Final    NO GROWTH 3 DAYS Performed at [facility], [place] [address], [place],  [address]    Report Status PENDING  Incomplete         Radiology Studies: No results found.      Scheduled Meds: . (feeding supplement) PROSource Plus  30 mL Oral BID BM  . atorvastatin  40 mg Oral QHS  .  calcium acetate  1,334 mg Oral TID with meals  . carvedilol  12.5 mg Oral BID WC  . Chlorhexidine Gluconate Cloth  6 each Topical Q0600  . docusate sodium  100 mg Oral Daily  . insulin aspart  0-15 Units Subcutaneous TID WC  . insulin aspart  0-5 Units Subcutaneous QHS  . insulin regular human CONCENTRATED  105 Units Subcutaneous Q breakfast   And  . insulin regular human CONCENTRATED  50 Units Subcutaneous Q supper  . mouth rinse  15 mL Mouth Rinse BID  . nortriptyline  10 mg Oral QHS  . pantoprazole  40 mg Oral Daily  . polyethylene glycol  17 g Oral BID  . senna  1 tablet Oral Daily  . sertraline  100 mg Oral Daily   Continuous Infusions: . magnesium sulfate bolus IVPB    . vancomycin Stopped (04/05/21 1332)     LOS: 7 days    Time  spent: 25 minutes.     Elmarie Shiley, MD PhD FACP [facility]   If 7PM-7AM, please contact night-coverage [email or web address]  04/05/2021, 2:28 PM

## 2021-04-05 NOTE — Progress Notes (Addendum)
Notified by dialysis RN of critical lab Hgb 4.5. Order 2 units prbcs and follow HH    ?post -op blood loss   Dr. Royce Macadamia and primary MD Regalado aware   Lynnda Child PA-C [place] 04/05/2021,11:54 AM  Requested stat repeat in addition to above.  Claudia Desanctis, MD 1:03 PM 04/05/2021

## 2021-04-05 NOTE — Progress Notes (Signed)
Patient refusing for labs to be drawn to recheck his hemoglobin. Notified MD.

## 2021-04-05 NOTE — Progress Notes (Signed)
Patient reporting pain but not able to get medication at this time. Notified MD. Per Dr. Tyrell Antonio ok to give Oxycodone early.

## 2021-04-06 DIAGNOSIS — B9562 Methicillin resistant Staphylococcus aureus infection as the cause of diseases classified elsewhere: Secondary | ICD-10-CM | POA: Diagnosis not present

## 2021-04-06 DIAGNOSIS — R7881 Bacteremia: Secondary | ICD-10-CM | POA: Diagnosis not present

## 2021-04-06 LAB — CBC WITH DIFFERENTIAL/PLATELET
Abs Immature Granulocytes: 0.43 10*3/uL — ABNORMAL HIGH (ref 0.00–0.07)
Basophils Absolute: 0 10*3/uL (ref 0.0–0.1)
Basophils Relative: 0 %
Eosinophils Absolute: 0.1 10*3/uL (ref 0.0–0.5)
Eosinophils Relative: 1 %
HCT: 20.9 % — ABNORMAL LOW (ref 39.0–52.0)
Hemoglobin: 6.8 g/dL — CL (ref 13.0–17.0)
Immature Granulocytes: 4 %
Lymphocytes Relative: 10 %
Lymphs Abs: 1 10*3/uL (ref 0.7–4.0)
MCH: 28.9 pg (ref 26.0–34.0)
MCHC: 32.5 g/dL (ref 30.0–36.0)
MCV: 88.9 fL (ref 80.0–100.0)
Monocytes Absolute: 0.9 10*3/uL (ref 0.1–1.0)
Monocytes Relative: 9 %
Neutro Abs: 8 10*3/uL — ABNORMAL HIGH (ref 1.7–7.7)
Neutrophils Relative %: 76 %
Platelets: 215 10*3/uL (ref 150–400)
RBC: 2.35 MIL/uL — ABNORMAL LOW (ref 4.22–5.81)
RDW: 18 % — ABNORMAL HIGH (ref 11.5–15.5)
WBC: 10.5 10*3/uL (ref 4.0–10.5)
nRBC: 0 % (ref 0.0–0.2)

## 2021-04-06 LAB — BASIC METABOLIC PANEL
Anion gap: 9 (ref 5–15)
BUN: 53 mg/dL — ABNORMAL HIGH (ref 6–20)
CO2: 25 mmol/L (ref 22–32)
Calcium: 8.3 mg/dL — ABNORMAL LOW (ref 8.9–10.3)
Chloride: 94 mmol/L — ABNORMAL LOW (ref 98–111)
Creatinine, Ser: 3.45 mg/dL — ABNORMAL HIGH (ref 0.61–1.24)
GFR, Estimated: 19 mL/min — ABNORMAL LOW (ref >60)
Glucose, Bld: 387 mg/dL — ABNORMAL HIGH (ref 70–99)
Potassium: 4.2 mmol/L (ref 3.5–5.1)
Sodium: 128 mmol/L — ABNORMAL LOW (ref 135–145)

## 2021-04-06 LAB — GLUCOSE, CAPILLARY
Glucose-Capillary: 61 mg/dL — ABNORMAL LOW (ref 70–99)
Glucose-Capillary: 260 mg/dL — ABNORMAL HIGH (ref 70–99)
Glucose-Capillary: 359 mg/dL — ABNORMAL HIGH (ref 70–99)

## 2021-04-06 LAB — PREPARE RBC (CROSSMATCH)

## 2021-04-06 MED ORDER — SODIUM CHLORIDE 0.9% IV SOLUTION: Freq: Once | INTRAVENOUS | Status: DC

## 2021-04-06 MED ORDER — INSULIN REGULAR HUMAN (CONC) 500 UNIT/ML ~~LOC~~ SOPN: 100.0000 [IU] | PEN_INJECTOR | Freq: Every day | SUBCUTANEOUS | Status: DC

## 2021-04-06 MED ORDER — SODIUM CHLORIDE 0.9% IV SOLUTION: Freq: Once | INTRAVENOUS | Status: AC

## 2021-04-06 MED ORDER — INSULIN REGULAR HUMAN (CONC) 500 UNIT/ML ~~LOC~~ SOPN
45.0000 [IU] | PEN_INJECTOR | Freq: Every day | SUBCUTANEOUS | Status: DC
  Administered 2021-04-06: 45 [IU] via SUBCUTANEOUS

## 2021-04-06 NOTE — Progress Notes (Addendum)
[facility] Progress Note   Subjective:   ABLA. Has received 2 u prbcs so far. Difficult stick and refusing repeat attempts to draw. Only wants to get blood drawn on dialysis. Reinforced with him importance of getting timely labs.  Seen in room. Denies cp, sob, n/v, melena.   Objective Vitals:   04/06/21 0007 04/06/21 0202 04/06/21 0400 04/06/21 0700  BP: (!) 111/54 (!) 127/52 (!) 122/53 (!) 132/51  Pulse: 87 85 85 82  Resp:   17 18  Temp: 99.2 F (37.3 C) 98.7 F (37.1 C) 98.9 F (37.2 C) 98.8 F (37.1 C)  TempSrc: Oral Oral Axillary Oral  SpO2: 97%  95%   Weight:      Height:       Physical Exam General:Well appearing man, NAD.  Heart:RRR; no murmur Lungs:CTA anteriorly Abdomen:soft Extremities:L AKA bandaged with wound vac, no RLE edema Dialysis Access:RUE AVG + bruit  Additional Objective Labs: Basic Metabolic Panel: Recent Labs  Lab 03/31/21 0500 04/02/21 0634 04/03/21 1226 04/05/21 0822  NA 125* 127* 129* 129*  K 3.4* 4.0 4.9 3.7  CL 88* 92* 99 94*  CO2 24 22  --  23  GLUCOSE 358* 290* 270* 156*  BUN 94* 79* 72* 104*  CREATININE 4.98* 4.03* 3.80* 4.39*  CALCIUM 8.5* 9.0  --  8.7*  PHOS 6.8*  --   --  6.6*   Liver Function Tests: Recent Labs  Lab 03/31/21 0500 04/05/21 0822  AST 19  --   ALT 19  --   ALKPHOS 63  --   BILITOT 0.7  --   PROT 7.2  --   ALBUMIN 2.9* 2.4*   CBC: Recent Labs  Lab 03/31/21 0500 04/02/21 0634 04/03/21 1226 04/05/21 1030 04/05/21 2217  WBC 8.5 5.7  --  11.1* 11.1*  HGB 8.4* 9.4* 8.8* 4.5* 5.4*  HCT 25.6* 30.5* 26.0* 14.0* 16.4*  MCV 92.1 94.1  --  90.9 85.4  PLT 86* 91*  --  226 195   Studies/Results: No results found. Medications: . magnesium sulfate bolus IVPB    . vancomycin Stopped (04/05/21 1214)   . (feeding supplement) PROSource Plus  30 mL Oral BID BM  . sodium chloride   Intravenous Once  . atorvastatin  40 mg Oral QHS  . calcium acetate  1,334 mg Oral TID with meals  .  carvedilol  12.5 mg Oral BID WC  . Chlorhexidine Gluconate Cloth  6 each Topical Q0600  . docusate sodium  100 mg Oral Daily  . insulin aspart  0-15 Units Subcutaneous TID WC  . insulin aspart  0-5 Units Subcutaneous QHS  . [START ON 04/07/2021] insulin regular human CONCENTRATED  100 Units Subcutaneous Q breakfast   And  . insulin regular human CONCENTRATED  45 Units Subcutaneous Q supper  . mouth rinse  15 mL Mouth Rinse BID  . nortriptyline  10 mg Oral QHS  . pantoprazole  40 mg Oral Daily  . polyethylene glycol  17 g Oral BID  . senna  1 tablet Oral Daily  . sertraline  100 mg Oral Daily    Dialysis Orders: [facility] MWF  4h 121kg 3K/2.5 bath RUE AVG/ R IJ TDC Hep 4500+ 2584md -Mircera 100 ug q2 last 4/27  Assessment/Plan: 1. MRSA bacteremia: BCx 5/1 +, repeat BCx 5/3 negative. On Vancomycin.TDC removed 5/3, no need to replace as AVG in use. 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 2. ESRD:Continue  HD per usual MWF schedule - next HD 5/11. Hold heparin  3. HD access:RUE AVG placed 01/30/21 also at [facility]. Had fistulogram 4/14 that showed patent AVG.TDC removed per IR 5/3, AVG in use. 4. Anemiaof ESRD/ABLA :Hgb trending down. Likely post-op blood loss. S/p 2 units prbcs 5/9. Order for another unit 5/10. Trend HH as patient allows.  Due for next dose ESA on 5/11-will order  5. HTN/ volume:BP stable, EDW will need to be lowered s/p AKA. 6. PTSD:Continue home meds. 7. COPD:Per primary. 8. DM2: Per primary, on insulin. 9. MBD:Ca ok,Phoshigh - Phoslo ^'d to 2/meals. No VDRA. 10. Nutrition: Alb low, continue supplements.   Lynnda Child PA-C [facility] 04/06/2021,9:17 AM   Seen and examined independently.  Agree with note and exam as documented above by physician extender and as noted here.  He declines additional HD today. Per patient they have trouble getting labs and per nursing he will not  let staff try.  Delays in labs have made it difficult to provide care.    General adult male in bed in no acute distress HEENT normocephalic atraumatic extraocular movements intact sclera anicteric Neck supple trachea midline Lungs clear to auscultation bilaterally normal work of breathing at rest  Heart S1S2 no rub Abdomen soft nontender nondistended Extremities non pitting edema right leg; left AKA Psych normal mood and affect Neuro alert and oriented x 3 provides hx and follows commands Access right arm graft bruit  Anemia - ESRD and acute blood loss.  I ordered an additional unit of PRBC's this AM and ordered another typed and crossed.  He had 2 units 5/9 day/overnight.  Labs are ordered and he has not always allowed staff to stick.  Trend Hb. Discussed with team - they are working with patient as well   ESRD on HD - HD per MWF schedule  Bacteremia - tunneled catheter out and using graft successfully  HTN - as above note EDW will need to be lowered as he is post-op AKA  Discussed with primary team this AM.  Would try to a central access such as [facility] to allow for lab draws.   Claudia Desanctis, MD 04/06/2021  10:22 AM

## 2021-04-06 NOTE — Progress Notes (Signed)
BT consumed and terminated, no BT reaction was noted  

## 2021-04-06 NOTE — Progress Notes (Signed)
received pt alert oriented not in distress on room air with ongoing BT 1 unit PRBC started 1833H with unit nos.[id number], A neg, expiry date at 04/30/2021 at 2359H running at 120 ml.hr

## 2021-04-06 NOTE — Evaluation (Signed)
Occupational Therapy Evaluation Patient Details Name: Jeffrey [name] MRN: [id number] DOB: 06-May-1961 Today's Date: 04/06/2021    History of Present Illness 60 y.o. male presents to [facility] ED on 03/28/2021 with reports of fever, chills, nausea/vomiting, confusion. Pt reports significant L knee pain, imaging revealing massive effusion. 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 PMH includes ESRD, CHF, COPD, DMII, HTN, PTSD.   Clinical Impression   PTA, pt lives with family and reports Modified Independent with ADLs and mobility using Rollator. Pt presents now s/p L AKA and eager for OOB attempts for toileting tasks. Pt overall Min A for bed mobility, Mod A x 2 for sit to stand in [place] for transfer to [facility] and progressing to Min A in [place] for return back to bed (for blood transfusion). Due to deficits noted below, pt requires Supervision for UB ADLs and Max A x 2 for LB ADLs in standing. With next session, plan to educate on compensatory strategies and use of lateral leans for LB ADLs, as well as standing attempts with RW. Based on pt's motivation, PLOF and eagerness to progress to independence, recommend CIR for comprehensive therapies.   Pt with low hgb this AM though denied lightheadedness with activity with BP WFL throughout session.     Follow Up Recommendations  CIR    Equipment Recommendations  3 in 1 bedside commode;Tub/shower bench;Wheelchair (measurements OT);Wheelchair cushion (measurements OT)    Recommendations for Other Services Rehab consult     Precautions / Restrictions Precautions Precautions: Fall Precaution Comments: wound vac Restrictions Weight Bearing Restrictions: Yes LLE Weight Bearing: Non weight bearing      Mobility Bed Mobility Overal bed mobility: Needs Assistance Bed Mobility: Supine to Sit;Sit to Supine     Supine to sit: Min assist;HOB elevated Sit to supine: Min assist   General bed mobility comments: Min A with  handheld assist to get to EOB, light assist to scoot hips forward. Assist to guide torso back to bed at end of session    Transfers Overall transfer level: Needs assistance Equipment used: Ambulation equipment used Transfers: Sit to/from Stand Sit to Stand: Mod assist;+2 physical assistance;+2 safety/equipment         General transfer comment: Mod A x 2 for sit to stand from bedside with Stedy, able to progress to Min A for sit to stand from [place] Overall balance assessment: Needs assistance Sitting-balance support: Bilateral upper extremity supported;Feet unsupported Sitting balance-Leahy Scale: Fair Sitting balance - Comments: fair static sitting   Standing balance support: Bilateral upper extremity supported;During functional activity Standing balance-Leahy Scale: Poor                             ADL either performed or assessed with clinical judgement   ADL Overall ADL's : Needs assistance/impaired Eating/Feeding: Independent;Bed level   Grooming: Set up;Sitting   Upper Body Bathing: Supervision/ safety;Sitting   Lower Body Bathing: Maximal assistance;+2 for physical assistance;+2 for safety/equipment;Sit to/from stand   Upper Body Dressing : Supervision/safety;Sitting   Lower Body Dressing: Maximal assistance;+2 for physical assistance;+2 for safety/equipment;Sitting/lateral leans;Sit to/from stand Lower Body Dressing Details (indicate cue type and reason): Attempted to guide pt in donning sock. pt unable to bring foot to self in bed due to hx of TKA. pt reports typically having to bend to feet for this task though endorses risk of falling attempting this way Toilet Transfer: Maximal assistance;+2 for  physical assistance;+2 for safety/equipment;BSC Toilet Transfer Details (indicate cue type and reason): Use of Stedy for transfer to/from [facility]. pt with good UB strength to assist in pulling self up Toileting- Clothing Manipulation and Hygiene: Maximal  assistance;+2 for physical assistance;+2 for safety/equipment;Sit to/from stand Toileting - Clothing Manipulation Details (indicate cue type and reason): Max A for peri care in [place]. able to hold self up using bars in frame       General ADL Comments: Max A x 2 for LB ADLs in standing (especially if attempting with RW). Pt demo good UB strength to assist in initial transfers using Stedy     Vision Patient Visual Report: No change from baseline Vision Assessment?: No apparent visual deficits     Perception     Praxis      Pertinent Vitals/Pain Pain Assessment: 0-10 Pain Score: 7  Pain Location: L residual limb Pain Descriptors / Indicators: Discomfort;Grimacing;Operative site guarding Pain Intervention(s): Premedicated before session;Monitored during session     Hand Dominance Right   Extremity/Trunk Assessment Upper Extremity Assessment Upper Extremity Assessment: Overall WFL for tasks assessed   Lower Extremity Assessment Lower Extremity Assessment: Defer to PT evaluation   Cervical / Trunk Assessment Cervical / Trunk Assessment: Normal   Communication Communication Communication: No difficulties   Cognition Arousal/Alertness: Awake/alert Behavior During Therapy: WFL for tasks assessed/performed Overall Cognitive Status: Within Functional Limits for tasks assessed                                     General Comments  Hgb low this AM. pt denies dizziness with all activity and BP WFL. 135/51 initially, 127/64 sitting EOB and 135/60s after transfers.    Exercises     Shoulder Instructions      Home Living Family/patient expects to be discharged to:: Skilled nursing facility Living Arrangements: Children (daughter) Available Help at Discharge: Family;Available PRN/intermittently Type of Home: Apartment Home Access: Level entry     Home Layout: One level     Bathroom Shower/Tub: Teacher, early years/pre: Standard     Home  Equipment: Cane - single point;Walker - 4 wheels;Wheelchair - manual          Prior Functioning/Environment Level of Independence: Independent with assistive device(s)        Comments: Pt reports ambulating with rollator prior to admission, typically household distances. Able to complete ADLs, IADLs        OT Problem List: Decreased activity tolerance;Impaired balance (sitting and/or standing);Decreased knowledge of use of DME or AE;Decreased knowledge of precautions;Pain      OT Treatment/Interventions: Self-care/ADL training;Therapeutic exercise;Energy conservation;DME and/or AE instruction;Therapeutic activities;Patient/family education;Balance training    OT Goals(Current goals can be found in the care plan section) Acute Rehab OT Goals Patient Stated Goal: be able to walk again OT Goal Formulation: With patient Time For Goal Achievement: 04/20/21 Potential to Achieve Goals: Good ADL Goals Pt Will Perform Grooming: with min guard assist;standing Pt Will Perform Lower Body Bathing: with min assist;sitting/lateral leans;sit to/from stand Pt Will Perform Lower Body Dressing: with min assist;sitting/lateral leans;sit to/from stand Pt Will Transfer to Toilet: with min assist;squat pivot transfer;stand pivot transfer;bedside commode Pt Will Perform Toileting - Clothing Manipulation and hygiene: with min assist;sitting/lateral leans;sit to/from stand  OT Frequency: Min 2X/week   Barriers to D/C:            Co-evaluation  AM-PAC OT "6 Clicks" Daily Activity     Outcome Measure Help from another person eating meals?: None Help from another person taking care of personal grooming?: A Little Help from another person toileting, which includes using toliet, bedpan, or urinal?: A Lot Help from another person bathing (including washing, rinsing, drying)?: A Lot Help from another person to put on and taking off regular upper body clothing?: A Little Help from  another person to put on and taking off regular lower body clothing?: A Lot 6 Click Score: 16   End of Session Equipment Utilized During Treatment: Gait belt;Other (comment) Charlaine Dalton) Nurse Communication: Mobility status;Other (comment) (RN present during much of session)  Activity Tolerance: Patient tolerated treatment well Patient left: in bed;with call bell/phone within reach;with bed alarm set  OT Visit Diagnosis: Unsteadiness on feet (R26.81);Other abnormalities of gait and mobility (R26.89);Muscle weakness (generalized) (M62.81);Pain Pain - Right/Left: Left Pain - part of body: Leg                Time: [id number] OT Time Calculation (min): 33 min Charges:  OT General Charges $OT Visit: 1 Visit OT Evaluation $OT Eval Moderate Complexity: 1 Mod OT Treatments $Self Care/Home Management : 8-22 mins  Malachy Chamber, OTR/L Acute Rehab Services Office: [phone]  Layla Maw 04/06/2021, 11:26 AM

## 2021-04-06 NOTE — Plan of Care (Signed)
  Problem: Activity: Goal: Risk for activity intolerance will decrease Outcome: Progressing   Problem: Nutrition: Goal: Adequate nutrition will be maintained Outcome: Progressing   Problem: Coping: Goal: Level of anxiety will decrease Outcome: Progressing   Problem: Pain Managment: Goal: General experience of comfort will improve Outcome: Progressing   Problem: Skin Integrity: Goal: Risk for impaired skin integrity will decrease Outcome: Progressing   Problem: Fluid Volume: Goal: Hemodynamic stability will improve Outcome: Progressing   Problem: Clinical Measurements: Goal: Signs and symptoms of infection will decrease Outcome: Progressing   Problem: Respiratory: Goal: Ability to maintain adequate ventilation will improve Outcome: Progressing

## 2021-04-06 NOTE — Progress Notes (Signed)
Pharmacy Antibiotic Note  Jeffrey [name] is a 60 y.o. male admitted on 03/28/2021 with MRSA bacteremia from possible catheter/graft infection +/- knee hardware infection.  Pharmacy has been consulted for vancomycin  dosing.  Pt is on D9 of vanc for desseminated MRSA bacteremia. S/P AKA for source control. Random vanc level pre-HD on 5/11 planned. Last HD 5/9 with dose of '1000mg'$  afterwards.    Plan: Continue vanc '1000mg'$  IV MWF.  Goal pre-HD level 15-25 mcg/mL. Level as needed  Height: '5\' 5"'$  (165.1 cm) Weight: 115 kg (253 lb 8.5 oz) IBW/kg (Calculated) : 61.5  Temp (24hrs), Avg:99 F (37.2 C), Min:97.5 F (36.4 C), Max:99.8 F (37.7 C)  Recent Labs  Lab 03/31/21 0500 04/02/21 0634 04/03/21 1226 04/05/21 0822 04/05/21 1030 04/05/21 2217  WBC 8.5 5.7  --   --  11.1* 11.1*  CREATININE 4.98* 4.03* 3.80* 4.39*  --   --     Estimated Creatinine Clearance: 21 mL/min (A) (by C-G formula based on SCr of 4.39 mg/dL (H)).    Allergies  Allergen Reactions  . Bupropion Swelling  . Dexmedetomidine Nausea And Vomiting    Other reaction(s): Other (See Comments) Dose-limiting bradycardia Dose-limiting bradycardia   . Ibuprofen Shortness Of Breath and Swelling    Pt tolerates aspirin Pt tolerates aspirin   . Pioglitazone Anaphylaxis and Rash  . Tomato Anaphylaxis   Cefepime 5/2>>5/2 Vancomycin 5/2>>  5/2 Bcx: MRSA 2/4 5/2 Fluid from L knee: ngtd 5/1 Ucx: negF  Marchia Diguglielmo A. Levada Dy, PharmD, BCPS, FNKF Clinical Pharmacist [place] Please utilize Amion for appropriate phone number to reach the unit pharmacist ([place])   04/06/2021 7:29 AM

## 2021-04-06 NOTE — Progress Notes (Addendum)
PROGRESS NOTE    Jeffrey [name]  ERX:[id number] DOB: [birth year] DOA: 03/28/2021 PCP: Pcp, No   Brief Narrative: 60 year old with past medical history significant for ESRD,  diabetes presents to the ED complaining of fever.  Accompanied by vomiting, diaphoresis and mild confusion.  He has been having left knee pain.  Patient has a history of chronic knee  pain and swelling left knee, prior imagine at [facility].  Patient has a history of knee replacement/hardware bilaterally.  In the ED left knee imaging showed massive knee joint effusion.  Arthrocentesis was performed however only 1 cc of gelatinous red fluid was obtained by ED provider.  Patient was started empirically on IV antibiotics.  Patient was evaluated by orthopedic, who is recommending AKA>  Patient also diagnosed with MRSA bacteremia, underwent hemodialysis catheter removal.  Currently has RUE AVG (01/2021).  ID and orthopedic consulted. Underwent Left AKA by Dr Lucia Gaskins  on 5/7. Patient decline TEE. ID recommends IV Vancomycin through June 18 with HD.   Likely Acute blood loss anemia, post surgery. Patient refuse labs 04/04/2021. Labs check 5/09  showed Hb 4.5. Patient has received 2 units PRBC. Plan for 3rd unit PRBC to be transfuse. Patient has agree to one try blood drawn after 3rd unit of blood.      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 -Need repeat blood cultures to document clearance, cultures ordered by ID. ID will provide blood  culture kit to HD units for blood culture to be drawn.  -2D ECHo calcification mitral and aortic valve, ID recommend TEE. --He was schedule for TEE, but he refuses. -ID recommend IV vancomycin with HD through June 18. Follow up with ID in 3 weeks.  -Blood culture no growth to date.   2-Left prosthetic knee septic arthritis: Left chronic infected total knee arthroplasty Arthrocentesis performed by ED only 1 cc gelatinous red fluid obtained.  Culture from arthrocentesis: no Growth to date.  CT; Irregular lucency around the tibial processes which may reflect hardware loosening or infection.  Not a specific moderate sized knee joint effusion. -Left AKA by Dr Lucia Gaskins  on 5/7.  -He has wound vac. Per Ortho patient needs wound vac  for minimum  5 days. If he goes to rehab Vac can be remove or will need to inform Ortho.   3-Diabetes type 2: Will decrease Humulin R 500--100 units in am and  Keep 45  units PM the same.   Continue with  sliding scale insulin. Reduce morning dose of Humulin due to hypoglycemia.   4-Acute blood loss Anemia;  Post op.  No evidence of active bleeding.  He has received 2 units PRBC> plan to transfuse another unit.  Plan to check CBC post transfusion.  I offer central line/hickman to draw blood, he refuse.  Addendum:  Hb 6.8 after 3rd unit of blood, will proceed with another PRBC transfusion.   Severe Sepsis: Secondary to septic arthritis, patient presents with Fever, tachycardia, tachypnea.  Found to have MRSA Bacteremia.  Needs TEE>  he does not want to talk about TEE Treated with IV antibiotics.   5-ESRD on HD;  HD catheter removed.  Appreciate nephrology assistance.   6-Hypersomnolence: Resolved.  Gabapentin, Nortriptyline trazodone, valium on hold.  -Resume Nortriptyline and valium lower dose PRN 5/04 -Resume trazadone 5/05.  - MRI brain no acute findings -likely narcotic related, Dilaudid  dose decreased.  7-Thrombocytopenia; secondary to infectious  process. Monitor.  8-HTN; Continue with coreg, amlodipine.   Hyponatremia; correction with HD>   Constipation; Miralax schedule. Add senna . Refuse suppository     Estimated body mass index is 42.19 kg/m as calculated from the following:   Height as of this encounter: '5\' 5"'  (1.651 m).   Weight as of this encounter: 115 kg.   DVT prophylaxis: Heparin  Code Status: Full code Family Communication: Care discussed with patient . Disposition Plan:  Status is: Inpatient  Remains inpatient appropriate because:IV treatments appropriate due to intensity of illness or inability to take PO   Dispo: The patient is from: Home              Anticipated d/c is to: to be determine              Patient currently is not medically stable to d/c.   Difficult to place patient No        Consultants:   ID  Nephrology  Orthopoedic  Procedures:   HD  Left AKA on 5/7  Antimicrobials:  Vancomycin 03/29/2021  Subjective: He  Feels less tired and weak. He has not had BM He decline central line for lab drawn.  He continue to have pain leg   Objective: Vitals:   04/06/21 0006 04/06/21 0007 04/06/21 0202 04/06/21 0400  BP: (!) 111/54 (!) 111/54 (!) 127/52 (!) 122/53  Pulse: 87 87 85 85  Resp: 20   17  Temp: 99.2 F (37.3 C) 99.2 F (37.3 C) 98.7 F (37.1 C) 98.9 F (37.2 C)  TempSrc: Oral Oral Oral Axillary  SpO2: 97% 97%  95%  Weight:      Height:        Intake/Output Summary (Last 24 hours) at 04/06/2021 0738 Last data filed at 04/06/2021 0600 Gross per 24 hour  Intake 1481 ml  Output 2050 ml  Net -569 ml   Filed Weights   04/03/21 1019 04/05/21 0755 04/05/21 1215  Weight: 122.9 kg 117.4 kg 115 kg    Examination:  General exam: NAD Respiratory system: CTA Cardiovascular system: S 1, S 2 RRR Gastrointestinal system: BS present, soft, nt Central nervous system: Alert Extremities: S/P AKA, wound vac in place.     Data Reviewed: I have personally reviewed  following labs and imaging studies  CBC: Recent Labs  Lab 03/31/21 0500 04/02/21 0634 04/03/21 1226 04/05/21 1030 04/05/21 2217  WBC 8.5 5.7  --  11.1* 11.1*  HGB 8.4* 9.4* 8.8* 4.5* 5.4*  HCT 25.6* 30.5* 26.0* 14.0* 16.4*  MCV 92.1 94.1  --  90.9 85.4  PLT 86* 91*  --  226 166   Basic Metabolic Panel: Recent Labs  Lab 03/31/21 0500 04/02/21 0634 04/03/21 1226 04/05/21 0822  NA 125* 127* 129* 129*  K 3.4* 4.0 4.9 3.7  CL 88* 92* 99 94*  CO2 24 22  --  23  GLUCOSE 358* 290* 270* 156*  BUN 94* 79* 72* 104*  CREATININE 4.98* 4.03* 3.80* 4.39*  CALCIUM 8.5* 9.0  --  8.7*  PHOS 6.8*  --   --  6.6*  GFR: Estimated Creatinine Clearance: 21 mL/min (A) (by C-G formula based on SCr of 4.39 mg/dL (H)). Liver Function Tests: Recent Labs  Lab 03/31/21 0500 04/05/21 0822  AST 19  --   ALT 19  --   ALKPHOS 63  --   BILITOT 0.7  --   PROT 7.2  --   ALBUMIN 2.9* 2.4*   No results for input(s): LIPASE, AMYLASE in the last 168 hours. No results for input(s): AMMONIA in the last 168 hours. Coagulation Profile: No results for input(s): INR, PROTIME in the last 168 hours. Cardiac Enzymes: No results for input(s): CKTOTAL, CKMB, CKMBINDEX, TROPONINI in the last 168 hours. BNP (last 3 results) No results for input(s): PROBNP in the last 8760 hours. HbA1C: No results for input(s): HGBA1C in the last 72 hours. CBG: Recent Labs  Lab 04/04/21 2057 04/05/21 0936 04/05/21 1328 04/05/21 1759 04/05/21 2016  GLUCAP 245* 132* 187* 204* 141*   Lipid Profile: No results for input(s): CHOL, HDL, LDLCALC, TRIG, CHOLHDL, LDLDIRECT in the last 72 hours. Thyroid Function Tests: No results for input(s): TSH, T4TOTAL, FREET4, T3FREE, THYROIDAB in the last 72 hours. Anemia Panel: No results for input(s): VITAMINB12, FOLATE, FERRITIN, TIBC, IRON, RETICCTPCT in the last 72 hours. Sepsis Labs: No results for input(s): PROCALCITON, LATICACIDVEN in the last 168 hours.  Recent Results  (from the past 240 hour(s))  Urine culture     Status: None   Collection Time: 03/28/21  9:49 PM   Specimen: In/Out Cath Urine  Result Value Ref Range Status   Specimen Description IN/OUT CATH URINE  Final   Special Requests NONE  Final   Culture   Final    NO GROWTH Performed at [facility], [address] [place], [place] [address]    Report Status 03/30/2021 FINAL  Final  Resp Panel by RT-PCR (Flu A&B, Covid) Nasopharyngeal Swab     Status: None   Collection Time: 03/28/21 10:35 PM   Specimen: Nasopharyngeal Swab; Nasopharyngeal(NP) swabs in vial transport medium  Result Value Ref Range Status   SARS Coronavirus 2 by RT PCR NEGATIVE NEGATIVE Final    Comment: (NOTE) SARS-CoV-2 target nucleic acids are NOT DETECTED.  The SARS-CoV-2 RNA is generally detectable in upper respiratory specimens during the acute phase of infection. The lowest concentration of SARS-CoV-2 viral copies this assay can detect is 138 copies/mL. A negative result does not preclude SARS-Cov-2 infection and should not be used as the sole basis for treatment or other patient management decisions. A negative result may occur with  improper specimen collection/handling, submission of specimen other than nasopharyngeal swab, presence of viral mutation(s) within the areas targeted by this assay, and inadequate number of viral copies(<138 copies/mL). A negative result must be combined with clinical observations, patient history, and epidemiological information. The expected result is Negative.  Fact Sheet for Patients:  EntrepreneurPulse.com.au  Fact Sheet for Healthcare Providers:  IncredibleEmployment.be  This test is no t yet approved or cleared by the [place] FDA and  has been authorized for detection and/or diagnosis of SARS-CoV-2 by FDA under an Emergency Use Authorization (EUA). This EUA will remain  in effect (meaning this test can be used) for the  duration of the COVID-19 declaration under Section 564(b)(1) of the Act, 21 U.S.C.section 360bbb-3(b)(1), unless the authorization is terminated  or revoked sooner.       Influenza A by PCR NEGATIVE NEGATIVE Final   Influenza B by PCR NEGATIVE NEGATIVE Final    Comment: (NOTE)  The Xpert Xpress SARS-CoV-2/FLU/RSV plus assay is intended as an aid in the diagnosis of influenza from Nasopharyngeal swab specimens and should not be used as a sole basis for treatment. Nasal washings and aspirates are unacceptable for Xpert Xpress SARS-CoV-2/FLU/RSV testing.  Fact Sheet for Patients: EntrepreneurPulse.com.au  Fact Sheet for Healthcare Providers: IncredibleEmployment.be  This test is not yet approved or cleared by the [place] FDA and has been authorized for detection and/or diagnosis of SARS-CoV-2 by FDA under an Emergency Use Authorization (EUA). This EUA will remain in effect (meaning this test can be used) for the duration of the COVID-19 declaration under Section 564(b)(1) of the Act, 21 U.S.C. section 360bbb-3(b)(1), unless the authorization is terminated or revoked.  Performed at [facility], [place] [address], [place], [place] [address]   Blood Culture (routine x 2)     Status: Abnormal   Collection Time: 03/28/21 11:30 PM   Specimen: BLOOD LEFT FOREARM  Result Value Ref Range Status   Specimen Description BLOOD LEFT FOREARM  Final   Special Requests   Final    BOTTLES DRAWN AEROBIC AND ANAEROBIC Blood Culture results may not be optimal due to an inadequate volume of blood received in culture bottles   Culture  Setup Time   Final    GRAM POSITIVE COCCI IN CLUSTERS IN BOTH AEROBIC AND ANAEROBIC BOTTLES CRITICAL RESULT CALLED TO, READ BACK BY AND VERIFIED WITH: [place] [phone] FCP Performed at [facility], [place] [address], [place], [place] [address]    Culture METHICILLIN RESISTANT STAPHYLOCOCCUS AUREUS (A)   Final   Report Status 03/31/2021 FINAL  Final   Organism ID, Bacteria METHICILLIN RESISTANT STAPHYLOCOCCUS AUREUS  Final      Susceptibility   Methicillin resistant staphylococcus aureus - MIC*    CIPROFLOXACIN >=8 RESISTANT Resistant     ERYTHROMYCIN >=8 RESISTANT Resistant     GENTAMICIN <=0.5 SENSITIVE Sensitive     OXACILLIN >=4 RESISTANT Resistant     TETRACYCLINE <=1 SENSITIVE Sensitive     VANCOMYCIN <=0.5 SENSITIVE Sensitive     TRIMETH/SULFA <=10 SENSITIVE Sensitive     CLINDAMYCIN <=0.25 SENSITIVE Sensitive     RIFAMPIN <=0.5 SENSITIVE Sensitive     Inducible Clindamycin NEGATIVE Sensitive     * METHICILLIN RESISTANT STAPHYLOCOCCUS AUREUS  Blood Culture ID Panel (Reflexed)     Status: Abnormal   Collection Time: 03/28/21 11:30 PM  Result Value Ref Range Status   Enterococcus faecalis NOT DETECTED NOT DETECTED Final   Enterococcus Faecium NOT DETECTED NOT DETECTED Final   Listeria monocytogenes NOT DETECTED NOT DETECTED Final   Staphylococcus species DETECTED (A) NOT DETECTED Final    Comment: CRITICAL RESULT CALLED TO, READ BACK BY AND VERIFIED WITH: PHARMD HALEY V. [phone] FCP    Staphylococcus aureus (BCID) DETECTED (A) NOT DETECTED Final    Comment: Methicillin (oxacillin)-resistant Staphylococcus aureus (MRSA). MRSA is predictably resistant to beta-lactam antibiotics (except ceftaroline). Preferred therapy is vancomycin unless clinically contraindicated. Patient requires contact precautions if  hospitalized. CRITICAL RESULT CALLED TO, READ BACK BY AND VERIFIED WITH: PHARMD HALEY V. [phone] FCP    Staphylococcus epidermidis NOT DETECTED NOT DETECTED Final   Staphylococcus lugdunensis NOT DETECTED NOT DETECTED Final   Streptococcus species NOT DETECTED NOT DETECTED Final   Streptococcus agalactiae NOT DETECTED NOT DETECTED Final   Streptococcus pneumoniae NOT DETECTED NOT DETECTED Final   Streptococcus pyogenes NOT DETECTED NOT DETECTED Final    A.calcoaceticus-baumannii NOT DETECTED NOT DETECTED Final  Bacteroides fragilis NOT DETECTED NOT DETECTED Final   Enterobacterales NOT DETECTED NOT DETECTED Final   Enterobacter cloacae complex NOT DETECTED NOT DETECTED Final   Escherichia coli NOT DETECTED NOT DETECTED Final   Klebsiella aerogenes NOT DETECTED NOT DETECTED Final   Klebsiella oxytoca NOT DETECTED NOT DETECTED Final   Klebsiella pneumoniae NOT DETECTED NOT DETECTED Final   Proteus species NOT DETECTED NOT DETECTED Final   Salmonella species NOT DETECTED NOT DETECTED Final   Serratia marcescens NOT DETECTED NOT DETECTED Final   Haemophilus influenzae NOT DETECTED NOT DETECTED Final   Neisseria meningitidis NOT DETECTED NOT DETECTED Final   Pseudomonas aeruginosa NOT DETECTED NOT DETECTED Final   Stenotrophomonas maltophilia NOT DETECTED NOT DETECTED Final   Candida albicans NOT DETECTED NOT DETECTED Final   Candida auris NOT DETECTED NOT DETECTED Final   Candida glabrata NOT DETECTED NOT DETECTED Final   Candida krusei NOT DETECTED NOT DETECTED Final   Candida parapsilosis NOT DETECTED NOT DETECTED Final   Candida tropicalis NOT DETECTED NOT DETECTED Final   Cryptococcus neoformans/gattii NOT DETECTED NOT DETECTED Final   Meth resistant mecA/C and MREJ DETECTED (A) NOT DETECTED Final    Comment: CRITICAL RESULT CALLED TO, READ BACK BY AND VERIFIED WITH: Jonn Shingles [phone] FCP Performed at [facility], [address], [place], [place] [address]   Body fluid culture w Gram Stain     Status: None   Collection Time: 03/29/21 12:10 AM   Specimen: Body Fluid  Result Value Ref Range Status   Specimen Description FLUID LEFT KNEE  Final   Special Requests SYRINGE  Final   Gram Stain FEW WBC SEEN NO ORGANISMS SEEN   Final   Culture   Final    NO GROWTH 3 DAYS Performed at [facility], [address], [place], [place] [address]    Report Status 04/01/2021 FINAL  Final  Blood Culture (routine x 2)      Status: None   Collection Time: 03/29/21  3:00 AM   Specimen: BLOOD  Result Value Ref Range Status   Specimen Description BLOOD SITE NOT SPECIFIED  Final   Special Requests   Final    BOTTLES DRAWN AEROBIC AND ANAEROBIC Blood Culture adequate volume   Culture   Final    NO GROWTH 5 DAYS Performed at [facility], [place] [address] [place], [place] [address]    Report Status 04/03/2021 FINAL  Final  Culture, blood (Routine X 2) w Reflex to ID Panel     Status: None (Preliminary result)   Collection Time: 03/31/21  7:00 AM   Specimen: BLOOD RIGHT ARM  Result Value Ref Range Status   Specimen Description BLOOD RIGHT ARM  Final   Special Requests   Final    BOTTLES DRAWN AEROBIC AND ANAEROBIC Blood Culture adequate volume   Culture   Final    NO GROWTH 3 DAYS Performed at [facility], [place] [address] [place], [place] [address]    Report Status PENDING  Incomplete  Culture, blood (Routine X 2) w Reflex to ID Panel     Status: None (Preliminary result)   Collection Time: 03/31/21  7:00 AM   Specimen: BLOOD RIGHT ARM  Result Value Ref Range Status   Specimen Description BLOOD RIGHT ARM  Final   Special Requests   Final    BOTTLES DRAWN AEROBIC AND ANAEROBIC Blood Culture adequate volume   Culture   Final  NO GROWTH 3 DAYS Performed at [facility], [place] [address] [place], [place] [address]    Report Status PENDING  Incomplete         Radiology Studies: No results found.      Scheduled Meds: . (feeding supplement) PROSource Plus  30 mL Oral BID BM  . atorvastatin  40 mg Oral QHS  . calcium acetate  1,334 mg Oral TID with meals  . carvedilol  12.5 mg Oral BID WC  . Chlorhexidine Gluconate Cloth  6 each Topical Q0600  . docusate sodium  100 mg Oral Daily  . insulin aspart  0-15 Units Subcutaneous TID WC  . insulin aspart  0-5 Units Subcutaneous QHS  . insulin regular human CONCENTRATED  45 Units Subcutaneous Q supper   And  . insulin regular  human CONCENTRATED  105 Units Subcutaneous Q breakfast  . mouth rinse  15 mL Mouth Rinse BID  . nortriptyline  10 mg Oral QHS  . pantoprazole  40 mg Oral Daily  . polyethylene glycol  17 g Oral BID  . senna  1 tablet Oral Daily  . sertraline  100 mg Oral Daily   Continuous Infusions: . magnesium sulfate bolus IVPB    . vancomycin Stopped (04/05/21 1214)     LOS: 8 days    Time spent: 25 minutes.     Elmarie Shiley, MD PhD FACP [facility]   If 7PM-7AM, please contact night-coverage [email or web address]  04/06/2021, 7:38 AM

## 2021-04-06 NOTE — Progress Notes (Signed)
PT Cancellation Note  Patient Details Name: Jeffrey [name] MRN: [id number] DOB: [birth year]   Cancelled Treatment:    Reason Eval/Treat Not Completed: Other (comment). Pt working with OT earlier today. Pt would prefer PT tomorrow in order to get either PT or OT each day instead of both in one day and then a day with neither. Pt also has HD MWF. Will work to spread therapies out a little more.    [place] 04/06/2021, 3:41 PM [place] Pager [phone] Office [phone]

## 2021-04-06 NOTE — Progress Notes (Signed)
Inpatient Rehab Admissions Coordinator:   PT recommending SNF, Pt. In agreement. CIR to sign off.   Clemens Catholic, [place], [place] Admissions Coordinator  [phone] ([place]) [phone] (office)

## 2021-04-07 DIAGNOSIS — J449 Chronic obstructive pulmonary disease, unspecified: Secondary | ICD-10-CM | POA: Diagnosis not present

## 2021-04-07 DIAGNOSIS — R7881 Bacteremia: Secondary | ICD-10-CM | POA: Diagnosis not present

## 2021-04-07 DIAGNOSIS — N186 End stage renal disease: Secondary | ICD-10-CM | POA: Diagnosis not present

## 2021-04-07 DIAGNOSIS — E1159 Type 2 diabetes mellitus with other circulatory complications: Secondary | ICD-10-CM | POA: Diagnosis not present

## 2021-04-07 LAB — CULTURE, BLOOD (ROUTINE X 2)
Culture: NO GROWTH
Culture: NO GROWTH
Special Requests: ADEQUATE
Special Requests: ADEQUATE

## 2021-04-07 LAB — RENAL FUNCTION PANEL
Albumin: 2.6 g/dL — ABNORMAL LOW (ref 3.5–5.0)
Anion gap: 12 (ref 5–15)
BUN: 58 mg/dL — ABNORMAL HIGH (ref 6–20)
CO2: 22 mmol/L (ref 22–32)
Calcium: 8.7 mg/dL — ABNORMAL LOW (ref 8.9–10.3)
Chloride: 94 mmol/L — ABNORMAL LOW (ref 98–111)
Creatinine, Ser: 3.3 mg/dL — ABNORMAL HIGH (ref 0.61–1.24)
GFR, Estimated: 21 mL/min — ABNORMAL LOW (ref >60)
Glucose, Bld: 281 mg/dL — ABNORMAL HIGH (ref 70–99)
Phosphorus: 5.4 mg/dL — ABNORMAL HIGH (ref 2.5–4.6)
Potassium: 4.5 mmol/L (ref 3.5–5.1)
Sodium: 128 mmol/L — ABNORMAL LOW (ref 135–145)

## 2021-04-07 LAB — GLUCOSE, CAPILLARY
Glucose-Capillary: 298 mg/dL — ABNORMAL HIGH (ref 70–99)
Glucose-Capillary: 245 mg/dL — ABNORMAL HIGH (ref 70–99)
Glucose-Capillary: 338 mg/dL — ABNORMAL HIGH (ref 70–99)
Glucose-Capillary: 306 mg/dL — ABNORMAL HIGH (ref 70–99)

## 2021-04-07 LAB — CBC
HCT: 22.6 % — ABNORMAL LOW (ref 39.0–52.0)
Hemoglobin: 7.4 g/dL — ABNORMAL LOW (ref 13.0–17.0)
MCH: 28.4 pg (ref 26.0–34.0)
MCHC: 32.7 g/dL (ref 30.0–36.0)
MCV: 86.6 fL (ref 80.0–100.0)
Platelets: 211 10*3/uL (ref 150–400)
RBC: 2.61 MIL/uL — ABNORMAL LOW (ref 4.22–5.81)
RDW: 18 % — ABNORMAL HIGH (ref 11.5–15.5)
WBC: 9.9 10*3/uL (ref 4.0–10.5)
nRBC: 0 % (ref 0.0–0.2)

## 2021-04-07 LAB — SURGICAL PATHOLOGY

## 2021-04-07 LAB — PREPARE RBC (CROSSMATCH)

## 2021-04-07 LAB — VANCOMYCIN, RANDOM: Vancomycin Rm: 13

## 2021-04-07 MED ORDER — LIDOCAINE HCL (PF) 1 % IJ SOLN: 5.0000 mL | INTRAMUSCULAR | Status: DC | PRN

## 2021-04-07 MED ORDER — VANCOMYCIN HCL 1500 MG/300ML IV SOLN
1500.0000 mg | INTRAVENOUS | Status: AC
  Administered 2021-04-07: 1500 mg via INTRAVENOUS
  Filled 2021-04-07: qty 300

## 2021-04-07 MED ORDER — DARBEPOETIN ALFA 100 MCG/0.5ML IJ SOSY: 100.0000 ug | PREFILLED_SYRINGE | INTRAMUSCULAR | Status: DC

## 2021-04-07 MED ORDER — VANCOMYCIN HCL IN DEXTROSE 1-5 GM/200ML-% IV SOLN
1000.0000 mg | INTRAVENOUS | Status: DC
  Administered 2021-04-09: 1000 mg via INTRAVENOUS

## 2021-04-07 MED ORDER — HYDROMORPHONE HCL 1 MG/ML IJ SOLN
INTRAMUSCULAR | Status: AC
  Filled 2021-04-07: qty 0.5

## 2021-04-07 MED ORDER — SODIUM CHLORIDE 0.9 % IV SOLN: 100.0000 mL | INTRAVENOUS | Status: DC | PRN

## 2021-04-07 MED ORDER — INSULIN REGULAR HUMAN (CONC) 500 UNIT/ML ~~LOC~~ SOPN
45.0000 [IU] | PEN_INJECTOR | Freq: Every day | SUBCUTANEOUS | Status: DC
  Administered 2021-04-07 – 2021-04-09 (×3): 45 [IU] via SUBCUTANEOUS

## 2021-04-07 MED ORDER — HEPARIN SODIUM (PORCINE) 1000 UNIT/ML DIALYSIS: 1000.0000 [IU] | INTRAMUSCULAR | Status: DC | PRN

## 2021-04-07 MED ORDER — INSULIN REGULAR HUMAN (CONC) 500 UNIT/ML ~~LOC~~ SOPN
110.0000 [IU] | PEN_INJECTOR | Freq: Every day | SUBCUTANEOUS | Status: DC
  Administered 2021-04-08 – 2021-04-10 (×2): 110 [IU] via SUBCUTANEOUS

## 2021-04-07 MED ORDER — LIDOCAINE-PRILOCAINE 2.5-2.5 % EX CREA: 1.0000 "application " | TOPICAL_CREAM | CUTANEOUS | Status: DC | PRN

## 2021-04-07 MED ORDER — SODIUM CHLORIDE 0.9% IV SOLUTION: Freq: Once | INTRAVENOUS | Status: DC

## 2021-04-07 MED ORDER — PENTAFLUOROPROP-TETRAFLUOROETH EX AERO: 1.0000 "application " | INHALATION_SPRAY | CUTANEOUS | Status: DC | PRN

## 2021-04-07 NOTE — Progress Notes (Signed)
Report given to HD nurse Linna Hoff, that include v/s, diet, on room air, and on contact precaution due to MRSA, bld works in was refused by pt and asked to do it during HD time, also informed that total 2 unit PRBC was given to pt yesterday with latest hgb=7.4

## 2021-04-07 NOTE — Progress Notes (Signed)
Due bld extraction refused, asked to do the bld work on HD

## 2021-04-07 NOTE — Progress Notes (Addendum)
[facility] Progress Note   Subjective:   Seen in room. Eating breakfast, frustrated with events, but no new complaints. Denies cp, sob. For dialysis today - will get another unit prbcs   Objective Vitals:   04/07/21 0800 04/07/21 0900 04/07/21 0906 04/07/21 0907  BP: 114/65 (!) 148/78 (!) 167/77 (!) 152/70  Pulse: 83 89 87 87  Resp: 18     Temp:  98.6 F (37 C)    TempSrc:  Oral    SpO2: 99% 99%    Weight:  114.6 kg    Height:       Physical Exam General:Well appearing man, NAD.  Heart:RRR; no murmur Lungs:CTA anteriorly Abdomen:soft Extremities:L AKA bandaged with wound vac, no RLE edema Dialysis Access:RUE AVG + bruit  Additional Objective Labs: Basic Metabolic Panel: Recent Labs  Lab 04/02/21 0634 04/03/21 1226 04/05/21 0822 04/06/21 1616  NA 127* 129* 129* 128*  K 4.0 4.9 3.7 4.2  CL 92* 99 94* 94*  CO2 22  --  23 25  GLUCOSE 290* 270* 156* 387*  BUN 79* 72* 104* 53*  CREATININE 4.03* 3.80* 4.39* 3.45*  CALCIUM 9.0  --  8.7* 8.3*  PHOS  --   --  6.6*  --    Liver Function Tests: Recent Labs  Lab 04/05/21 0822  ALBUMIN 2.4*   CBC: Recent Labs  Lab 04/02/21 0634 04/03/21 1226 04/05/21 1030 04/05/21 2217 04/06/21 1616 04/07/21 0032  WBC 5.7  --  11.1* 11.1* 10.5 9.9  NEUTROABS  --   --   --   --  8.0*  --   HGB 9.4*   < > 4.5* 5.4* 6.8* 7.4*  HCT 30.5*   < > 14.0* 16.4* 20.9* 22.6*  MCV 94.1  --  90.9 85.4 88.9 86.6  PLT 91*  --  226 195 215 211   < > = values in this interval not displayed.   Studies/Results: No results found. Medications: . [START ON 04/08/2021] sodium chloride    . [START ON 04/08/2021] sodium chloride    . magnesium sulfate bolus IVPB    . vancomycin Stopped (04/05/21 1214)   . (feeding supplement) PROSource Plus  30 mL Oral BID BM  . sodium chloride   Intravenous Once  . sodium chloride   Intravenous Once  . atorvastatin  40 mg Oral QHS  . calcium acetate  1,334 mg Oral TID with meals  .  carvedilol  12.5 mg Oral BID WC  . Chlorhexidine Gluconate Cloth  6 each Topical Q0600  . docusate sodium  100 mg Oral Daily  . insulin aspart  0-15 Units Subcutaneous TID WC  . insulin aspart  0-5 Units Subcutaneous QHS  . [START ON 04/08/2021] insulin regular human CONCENTRATED  110 Units Subcutaneous Q breakfast   And  . insulin regular human CONCENTRATED  45 Units Subcutaneous Q supper  . mouth rinse  15 mL Mouth Rinse BID  . nortriptyline  10 mg Oral QHS  . pantoprazole  40 mg Oral Daily  . polyethylene glycol  17 g Oral BID  . senna  1 tablet Oral Daily  . sertraline  100 mg Oral Daily    Dialysis Orders: [facility] MWF  4h 121kg 3K/2.5 bath RUE AVG/ R IJ TDC Hep 4500+ 254md -Mircera 100 ug q2 last 4/27  Assessment/Plan: 1. MRSA bacteremia: BCx 5/1 +, repeat BCx 5/3 negative. On Vancomycin.TDC removed 5/3, no need to replace as AVG in use. Ortho consulted, felt chronically infected L  knee with unstable hardware ->s/p L AKA on 04/03/21. 2. ESRD:Continue HD per usual MWF schedule - next HD 5/11. Hold heparin  3. HD access:RUE AVG placed 01/30/21 also at [facility]. Had fistulogram 4/14 that showed patent AVG.TDC removed per IR 5/3, AVG in use. 4. Anemiaof ESRD/ABLA : Likely post-op blood loss. Hgb 7.4 S/p 2 units prbcs 5/9. Order for another unit 5/11.  Trend HH as patient allows.  Due for next dose ESA on 5/11-will order  5. HTN/ volume:BP stable, EDW will need to be lowered s/p AKA. 6. PTSD:Continue home meds. 7. COPD:Per primary. 8. DM2: Per primary, on insulin. 9. MBD:Ca ok,Phoshigh - Phoslo ^'d to 2/meals. No VDRA. 10. Nutrition: Alb low, continue supplements.   Lynnda Child PA-C [facility] 04/07/2021,9:27 AM   Seen and examined independently.  Agree with note and exam as documented above by physician extender and as noted here.  He's frustrated with the trouble they've had getting labs and he states he's not willing to get any IV in his  neck.    General adult male in bed in no acute distress HEENT normocephalic atraumatic extraocular movements intact sclera anicteric Neck supple trachea midline Lungs clear to auscultation bilaterally normal work of breathing at rest; on room air Heart S1S2 no rub Abdomen soft nontender nondistended Extremities nonpitting edema; left AKA Psych normal mood and affect Neuro alert and oriented x 3 provides hx and follows commands Access RUE AVG bruit  ESRD on HD - HD today per MWF schedule. Note EDW will need to be adjusted as he is s/p AKA  Anemia and acute blood loss - PRBC's today are ordered; redose his ESA.  MRSA bacteremia - tunneled catheter out and using his graft now. S/p left AKA  HTN - lower EDW as above  Claudia Desanctis, MD 04/07/2021 12:12 PM

## 2021-04-07 NOTE — TOC Initial Note (Signed)
Transition of Care [facility]) - Initial/Assessment Note    Patient Details  Name: Jeffrey [name] MRN: [id number] Date of Birth: 10-26-1961  Transition of Care [facility]) CM/SW Contact:    Benard Halsted, LCSW Phone Number: 04/07/2021, 4:09 PM  Clinical Narrative:                 CSW following for SNF placement. Patient has bed offer and will need to participate in therapy for insurance approval. Medicaid will be able to transport patient from SNF to dialysis and back. Patient will also need updated COVID test within 24 hours of discharge.   Expected Discharge Plan: Skilled Nursing Facility Barriers to Discharge: Insurance Authorization,Continued Medical Work up   Patient Goals and CMS Choice Patient states their goals for this hospitalization and ongoing recovery are:: Rehab CMS Medicare.gov Compare Post Acute Care list provided to:: Patient Choice offered to / list presented to : Patient  Expected Discharge Plan and Services Expected Discharge Plan: [place] In-house Referral: Clinical Social Work   Post Acute Care Choice: [place] Living arrangements for the past 2 months: [place]                                      Prior Living Arrangements/Services Living arrangements for the past 2 months: Single Family Home Lives with:: Spouse Patient language and need for interpreter reviewed:: Yes Do you feel safe going back to the place where you live?: Yes      Need for Family Participation in Patient Care: Yes (Comment) Care giver support system in place?: Yes (comment)   Criminal Activity/Legal Involvement Pertinent to Current Situation/Hospitalization: No - Comment as needed  Activities of Daily Living Home Assistive Devices/Equipment: None ADL Screening (condition at time of admission) Patient's cognitive ability adequate to safely complete daily activities?: Yes Is the patient deaf or have difficulty hearing?: No Does the patient  have difficulty seeing, even when wearing glasses/contacts?: No Does the patient have difficulty concentrating, remembering, or making decisions?: No Patient able to express need for assistance with ADLs?: Yes Does the patient have difficulty dressing or bathing?: No Independently performs ADLs?: Yes (appropriate for developmental age) Does the patient have difficulty walking or climbing stairs?: Yes Weakness of Legs: Both Weakness of Arms/Hands: Both  Permission Sought/Granted Permission sought to share information with : Facility Contact Representative,Family Supports Permission granted to share information with : Yes, Verbal Permission Granted  Share Information with NAME: Rod Holler  Permission granted to share info w AGENCY: SNFs  Permission granted to share info w Relationship: Spouse  Permission granted to share info w Contact Information: [phone]  Emotional Assessment Appearance:: Appears stated age Attitude/Demeanor/Rapport:  (Appropriate) Affect (typically observed): Accepting,Appropriate Orientation: : Oriented to Self,Oriented to Place,Oriented to  Time,Oriented to Situation Alcohol / Substance Use: Not Applicable Psych Involvement: No (comment)  Admission diagnosis:  Fever [R50.9] End-stage renal disease on hemodialysis ([place]) [N18.6, Z99.2] Left knee pain, unspecified chronicity [M25.562] Sepsis, due to unspecified organism, unspecified whether acute organ dysfunction present [facility]) [A41.9] Patient Active Problem List   Diagnosis Date Noted  . Infection of prosthetic left knee joint ([place])   . MRSA bacteremia 03/30/2021  . Left knee pain   . 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 Fever 03/29/2021  . ESRD (end stage renal disease) ([place]) 03/29/2021  . COPD (chronic obstructive pulmonary disease) ([place]) 03/29/2021  . Hypertension  complicating diabetes ([place]) 03/29/2021  . Type 2 diabetes mellitus with hyperlipidemia ([place]) 03/29/2021  . PTSD  (post-traumatic stress disorder) 03/29/2021  . CHF (congestive heart failure) ([place]) 03/29/2021   PCP:  Pcp, No Pharmacy:   [facility] [place], [address] AT [address] [place] [address] Phone: [phone] Fax: [phone]     Social Determinants of Health (SDOH) Interventions    Readmission Risk Interventions Readmission Risk Prevention Plan 04/07/2021  Transportation Screening Complete  Medication Review Press photographer) Referral to Pharmacy  PCP or Specialist appointment within 3-5 days of discharge Complete  HRI or [place] Complete  SW Recovery Care/Counseling Consult Complete  [place] Complete

## 2021-04-07 NOTE — Progress Notes (Signed)
PT Cancellation Note  Patient Details Name: Jeffrey [name] MRN: [id number] DOB: Mar 29, 1961   Cancelled Treatment:    Reason Eval/Treat Not Completed: Pain limiting ability to participate at first he was agreeable to trying PT despite high pain levels (had just received pain medication), however after lowering the legs of the bed his pain increased further and he politely declined PT today. Will continue to follow.    Windell Norfolk, DPT, PN1   Supplemental Physical Therapist [facility]    Pager [phone] Acute Rehab Office [phone]

## 2021-04-07 NOTE — Progress Notes (Signed)
Pharmacy Antibiotic Note  Jeffrey [name] is a 60 y.o. 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  Patient continues on Vancomycin for MRSA bacteremia / joint infection with plans to continue x 6 weeks.    Pre HD Vancomycin level 13.  Estimated post HD level ~ 7.2.  Will order increased Vancomycin dose 5/11 to target pre-HD level of 25 mcg/ml.  Plan: Vancomycin '1500mg'$  IV x 1 dose (after HD 5/11) Resume 1000 mg IV qHD MWF starting 5/13 Goal pre-HD level 15-25 mcg/ml Follow-up HD scheule and associated labs.  Height: '5\' 5"'$  (165.1 cm) Weight: 114.6 kg (252 lb 10.4 oz) IBW/kg (Calculated) : 61.5  Temp (24hrs), Avg:98.6 F (37 C), Min:97.8 F (36.6 C), Max:99.3 F (37.4 C)  Recent Labs  Lab 04/02/21 0634 04/03/21 1226 04/05/21 0822 04/05/21 1030 04/05/21 2217 04/06/21 1616 04/07/21 0032 04/07/21 0953  WBC 5.7  --   --  11.1* 11.1* 10.5 9.9  --   CREATININE 4.03* 3.80* 4.39*  --   --  3.45*  --  3.30*  VANCORANDOM  --   --   --   --   --   --   --  13    Estimated Creatinine Clearance: 27.8 mL/min (A) (by C-G formula based on SCr of 3.3 mg/dL (H)).    Allergies  Allergen Reactions  . Bupropion Swelling  . Dexmedetomidine Nausea And Vomiting    Other reaction(s): Other (See Comments) Dose-limiting bradycardia Dose-limiting bradycardia   . Ibuprofen Shortness Of Breath and Swelling    Pt tolerates aspirin Pt tolerates aspirin   . Pioglitazone Anaphylaxis and Rash  . Tomato Anaphylaxis    Antimicrobials this admission: Cefepime 5/2>>5/2 Vancomycin 5/2>> (6/18)  Dose adjustments this admission:  5/11 pre-HD VR 13, estimated post 7.28, Vanc 1500 x 1 extra dose, then resume 1gm qHD  Microbiology results: 5/4 BCx: negative 5/2 Bcx: MRSA 2/4 5/2 Fluid from L knee: negF 5/1 Ucx: negF  Thank you for allowing pharmacy to be a part of this patient's care.  [employer], Pharm.D., BCPS Clinical  Pharmacist  **Pharmacist phone directory can be found on [email or web address] listed under [place].  04/07/2021 11:39 AM

## 2021-04-07 NOTE — Progress Notes (Signed)
PROGRESS NOTE    Jeffrey [name]   [id number]  DOB: July 08, 1961  DOA: 03/28/2021 PCP: Merryl Hacker, No   Brief Narrative:  Jeffrey [name] 60 year old with past medical history significant for ESRD,  diabetes presents to the ED complaining of fever.  Accompanied by vomiting, diaphoresis and mild confusion.  He has been having left knee pain.  Patient has a history of chronic knee  pain and swelling left knee, prior imagine at [facility].  Patient has a history of knee replacement/hardware bilaterally.  In the ED left knee imaging showed massive knee joint effusion.  Arthrocentesis was performed however only 1 cc of gelatinous red fluid was obtained by ED provider.  Patient was started empirically on IV antibiotics.  Patient was evaluated by orthopedic, who is recommending AKA>  Patient also diagnosed with MRSA bacteremia, underwent hemodialysis catheter removal.  Currently has RUE AVG (01/2021).  ID and orthopedic consulted. Underwent Left AKA by Dr Lucia Gaskins  on 5/7. Patient decline TEE. ID recommends IV Vancomycin through June 18 with HD.   Likely Acute blood loss anemia, post surgery. Patient refuse labs 04/04/2021. Labs check 5/09  showed Hb 4.5. Patient has received 2 units PRBC. Plan for 3rd unit PRBC to be transfuse. Patient has agree to one try blood drawn after 3rd unit of blood.    Subjective: Angry about the amount of sticks he is getting for blood draws and refusing to get more. He does want to continue all other treatments including dialysis. He has no other complaints.     Assessment & Plan:   Principal Problem:   MRSA bacteremia with severe sepsis - cont Vanc with HD until 6/18 & f/u with ID in 3 wks - he has declined at TEE  Active Problems: Infection of prosthetic left knee joint  - left AKA 5/7 - cont wound vac x 5 days (today is last day) - bloody drainage noted in vac  Anemia  due to ESRD, acute infection and possibly acute blood loss from AKA - 3 U PRBC required for HB of 4.5  which has improved to 7.4  DM 2 - increasing AM insulin as sugars high in PM     ESRD (end stage renal disease) -cont dialysis per nephrology - HD cath removed  Hypersomnolence - resolved after holding Gabapentin, Nortriptyline trazodone, Valium   Morbid obesity   Body mass index is 41.09 kg/m.    Time spent in minutes: 35 DVT prophylaxis: SCD's Start: 04/04/21 0402 SCDs Start: 03/29/21 0842 Code Status: Full code Family Communication:  Level of Care: Level of care: Progressive Disposition Plan:  Status is: Inpatient  Remains inpatient appropriate because:Inpatient level of care appropriate due to severity of illness   Dispo: The patient is from: Home              Anticipated d/c is to: SNF              Patient currently is not medically stable to d/c.   Difficult to place patient No      Consultants:   ID  Nephrology  Orthopedic surgery Procedures:   Left AKA Antimicrobials:  Anti-infectives (From admission, onward)   Start     Dose/Rate Route Frequency Ordered Stop   04/09/21 1200  vancomycin (VANCOCIN) IVPB 1000 mg/200 mL premix        1,000 mg 200 mL/hr over 60 Minutes Intravenous Every M-W-F (Hemodialysis) 04/07/21 1140     04/07/21 1145  vancomycin (VANCOREADY) IVPB 1500 mg/300 mL  1,500 mg 150 mL/hr over 120 Minutes Intravenous To Hemodialysis 04/07/21 1140 04/07/21 1428   04/03/21 1130  ceFAZolin (ANCEF) IVPB 3g/100 mL premix  Status:  Discontinued        3 g 200 mL/hr over 30 Minutes Intravenous On call to O.R. 04/03/21 1008 04/03/21 1459   04/03/21 1010  ceFAZolin (ANCEF) 3-0.9 GM/100ML-% IVPB       Note to Pharmacy: Baird Lyons  : cabinet override      04/03/21 1010 04/03/21 2214   04/03/21 0915  vancomycin (VANCOREADY) IVPB 750 mg/150 mL        750 mg 150 mL/hr over 60 Minutes Intravenous  Once 04/03/21 0829 04/03/21 1622   03/29/21 2000  ceFEPIme (MAXIPIME) 2 g in sodium chloride 0.9 % 100 mL IVPB  Status:  Discontinued         2 g 200 mL/hr over 30 Minutes Intravenous Every M-W-F (2000) 03/29/21 0321 03/29/21 1541   03/29/21 1200  vancomycin (VANCOCIN) IVPB 1000 mg/200 mL premix  Status:  Discontinued        1,000 mg 200 mL/hr over 60 Minutes Intravenous Every M-W-F (Hemodialysis) 03/29/21 0321 04/07/21 1140   03/29/21 0000  vancomycin (VANCOCIN) 2,500 mg in sodium chloride 0.9 % 500 mL IVPB        2,500 mg 250 mL/hr over 120 Minutes Intravenous  Once 03/28/21 2346 03/29/21 0340   03/29/21 0000  ceFEPIme (MAXIPIME) 2 g in sodium chloride 0.9 % 100 mL IVPB        2 g 200 mL/hr over 30 Minutes Intravenous  Once 03/28/21 2346 03/29/21 0126       Objective: Vitals:   04/07/21 1200 04/07/21 1230 04/07/21 1306 04/07/21 1703  BP: (!) 151/78 140/86 135/62 135/61  Pulse:   88 93  Resp:   16 18  Temp:   98.8 F (37.1 C)   TempSrc:   Oral   SpO2:   99% 98%  Weight:   112 kg   Height:        Intake/Output Summary (Last 24 hours) at 04/07/2021 1802 Last data filed at 04/07/2021 1600 Gross per 24 hour  Intake 1106.9 ml  Output -1461 ml  Net 2567.9 ml   Filed Weights   04/05/21 1215 04/07/21 0900 04/07/21 1306  Weight: 115 kg 114.6 kg 112 kg    Examination: General exam: Appears comfortable  HEENT: PERRLA, oral mucosa moist, no sclera icterus or thrush Respiratory system: Clear to auscultation. Respiratory effort normal. Cardiovascular system: S1 & S2 heard, RRR.   Gastrointestinal system: Abdomen soft, non-tender, nondistended. Normal bowel sounds. Central nervous system: Alert and oriented. No focal neurological deficits. Extremities: No cyanosis, clubbing or edema- wound vac on left stump noted Skin: No rashes or ulcers Psychiatry:  Mood & affect appropriate.     Data Reviewed: I have personally reviewed following labs and imaging studies  CBC: Recent Labs  Lab 04/02/21 0634 04/03/21 1226 04/05/21 1030 04/05/21 2217 04/06/21 1616 04/07/21 0032  WBC 5.7  --  11.1* 11.1* 10.5 9.9   NEUTROABS  --   --   --   --  8.0*  --   HGB 9.4* 8.8* 4.5* 5.4* 6.8* 7.4*  HCT 30.5* 26.0* 14.0* 16.4* 20.9* 22.6*  MCV 94.1  --  90.9 85.4 88.9 86.6  PLT 91*  --  226 195 215 [id number]   Basic Metabolic Panel: Recent Labs  Lab 04/02/21 0634 04/03/21 1226 04/05/21 0822 04/06/21 1616 04/07/21 0953  NA 127* 129* 129*  128* 128*  K 4.0 4.9 3.7 4.2 4.5  CL 92* 99 94* 94* 94*  CO2 22  --  '23 25 22  '$ GLUCOSE 290* 270* 156* 387* 281*  BUN 79* 72* 104* 53* 58*  CREATININE 4.03* 3.80* 4.39* 3.45* 3.30*  CALCIUM 9.0  --  8.7* 8.3* 8.7*  PHOS  --   --  6.6*  --  5.4*   GFR: Estimated Creatinine Clearance: 27.5 mL/min (A) (by C-G formula based on SCr of 3.3 mg/dL (H)). Liver Function Tests: Recent Labs  Lab 04/05/21 0822 04/07/21 0953  ALBUMIN 2.4* 2.6*   No results for input(s): LIPASE, AMYLASE in the last 168 hours. No results for input(s): AMMONIA in the last 168 hours. Coagulation Profile: No results for input(s): INR, PROTIME in the last 168 hours. Cardiac Enzymes: No results for input(s): CKTOTAL, CKMB, CKMBINDEX, TROPONINI in the last 168 hours. BNP (last 3 results) No results for input(s): PROBNP in the last 8760 hours. HbA1C: No results for input(s): HGBA1C in the last 72 hours. CBG: Recent Labs  Lab 04/06/21 1206 04/06/21 1717 04/06/21 2108 04/07/21 0759 04/07/21 1702  GLUCAP 260* 359* 298* 245* 338*   Lipid Profile: No results for input(s): CHOL, HDL, LDLCALC, TRIG, CHOLHDL, LDLDIRECT in the last 72 hours. Thyroid Function Tests: No results for input(s): TSH, T4TOTAL, FREET4, T3FREE, THYROIDAB in the last 72 hours. Anemia Panel: No results for input(s): VITAMINB12, FOLATE, FERRITIN, TIBC, IRON, RETICCTPCT in the last 72 hours. Urine analysis:    Component Value Date/Time   COLORURINE AMBER (A) 03/30/2021 0239   APPEARANCEUR HAZY (A) 03/30/2021 0239   LABSPEC 1.018 03/30/2021 0239   PHURINE 5.0 03/30/2021 0239   GLUCOSEU NEGATIVE 03/30/2021 0239   HGBUR  NEGATIVE 03/30/2021 0239   BILIRUBINUR NEGATIVE 03/30/2021 0239   KETONESUR NEGATIVE 03/30/2021 0239   PROTEINUR NEGATIVE 03/30/2021 0239   NITRITE NEGATIVE 03/30/2021 0239   LEUKOCYTESUR NEGATIVE 03/30/2021 0239   Sepsis Labs: '@LABRCNTIP'$ (procalcitonin:4,lacticidven:4) ) Recent Results (from the past 240 hour(s))  Urine culture     Status: None   Collection Time: 03/28/21  9:49 PM   Specimen: In/Out Cath Urine  Result Value Ref Range Status   Specimen Description IN/OUT CATH URINE  Final   Special Requests NONE  Final   Culture   Final    NO GROWTH Performed at [facility], [place] [address], [place], [place] [address]    Report Status 03/30/2021 FINAL  Final  Resp Panel by RT-PCR (Flu A&B, Covid) Nasopharyngeal Swab     Status: None   Collection Time: 03/28/21 10:35 PM   Specimen: Nasopharyngeal Swab; Nasopharyngeal(NP) swabs in vial transport medium  Result Value Ref Range Status   SARS Coronavirus 2 by RT PCR NEGATIVE NEGATIVE Final    Comment: (NOTE) SARS-CoV-2 target nucleic acids are NOT DETECTED.  The SARS-CoV-2 RNA is generally detectable in upper respiratory specimens during the acute phase of infection. The lowest concentration of SARS-CoV-2 viral copies this assay can detect is 138 copies/mL. A negative result does not preclude SARS-Cov-2 infection and should not be used as the sole basis for treatment or other patient management decisions. A negative result may occur with  improper specimen collection/handling, submission of specimen other than nasopharyngeal swab, presence of viral mutation(s) within the areas targeted by this assay, and inadequate number of viral copies(<138 copies/mL). A negative result must be combined with clinical observations, patient history, and epidemiological information. The expected result is Negative.  Fact Sheet for Patients:  EntrepreneurPulse.com.au  Fact Sheet for Healthcare Providers:   IncredibleEmployment.be  This test is no t yet approved or cleared by the [place] FDA and  has been authorized for detection and/or diagnosis of SARS-CoV-2 by FDA under an Emergency Use Authorization (EUA). This EUA will remain  in effect (meaning this test can be used) for the duration of the COVID-19 declaration under Section 564(b)(1) of the Act, 21 U.S.C.section 360bbb-3(b)(1), unless the authorization is terminated  or revoked sooner.       Influenza A by PCR NEGATIVE NEGATIVE Final   Influenza B by PCR NEGATIVE NEGATIVE Final    Comment: (NOTE) The Xpert Xpress SARS-CoV-2/FLU/RSV plus assay is intended as an aid in the diagnosis of influenza from Nasopharyngeal swab specimens and should not be used as a sole basis for treatment. Nasal washings and aspirates are unacceptable for Xpert Xpress SARS-CoV-2/FLU/RSV testing.  Fact Sheet for Patients: EntrepreneurPulse.com.au  Fact Sheet for Healthcare Providers: IncredibleEmployment.be  This test is not yet approved or cleared by the [place] FDA and has been authorized for detection and/or diagnosis of SARS-CoV-2 by FDA under an Emergency Use Authorization (EUA). This EUA will remain in effect (meaning this test can be used) for the duration of the COVID-19 declaration under Section 564(b)(1) of the Act, 21 U.S.C. section 360bbb-3(b)(1), unless the authorization is terminated or revoked.  Performed at [facility], [place] [address] [place], [place] [address]   Blood Culture (routine x 2)     Status: Abnormal   Collection Time: 03/28/21 11:30 PM   Specimen: BLOOD LEFT FOREARM  Result Value Ref Range Status   Specimen Description BLOOD LEFT FOREARM  Final   Special Requests   Final    BOTTLES DRAWN AEROBIC AND ANAEROBIC Blood Culture results may not be optimal due to an inadequate volume of blood received in culture bottles   Culture  Setup Time   Final     GRAM POSITIVE COCCI IN CLUSTERS IN BOTH AEROBIC AND ANAEROBIC BOTTLES CRITICAL RESULT CALLED TO, READ BACK BY AND VERIFIED WITH: [place] [id number] FCP Performed at [facility], [place] [address], [place], [place] [address]    Culture METHICILLIN RESISTANT STAPHYLOCOCCUS AUREUS (A)  Final   Report Status 03/31/2021 FINAL  Final   Organism ID, Bacteria METHICILLIN RESISTANT STAPHYLOCOCCUS AUREUS  Final      Susceptibility   Methicillin resistant staphylococcus aureus - MIC*    CIPROFLOXACIN >=8 RESISTANT Resistant     ERYTHROMYCIN >=8 RESISTANT Resistant     GENTAMICIN <=0.5 SENSITIVE Sensitive     OXACILLIN >=4 RESISTANT Resistant     TETRACYCLINE <=1 SENSITIVE Sensitive     VANCOMYCIN <=0.5 SENSITIVE Sensitive     TRIMETH/SULFA <=10 SENSITIVE Sensitive     CLINDAMYCIN <=0.25 SENSITIVE Sensitive     RIFAMPIN <=0.5 SENSITIVE Sensitive     Inducible Clindamycin NEGATIVE Sensitive     * METHICILLIN RESISTANT STAPHYLOCOCCUS AUREUS  Blood Culture ID Panel (Reflexed)     Status: Abnormal   Collection Time: 03/28/21 11:30 PM  Result Value Ref Range Status   Enterococcus faecalis NOT DETECTED NOT DETECTED Final   Enterococcus Faecium NOT DETECTED NOT DETECTED Final   Listeria monocytogenes NOT DETECTED NOT DETECTED Final   Staphylococcus species DETECTED (A) NOT DETECTED Final    Comment: CRITICAL RESULT CALLED TO, READ BACK BY AND VERIFIED WITH: PHARMD HALEY V. [id number] FCP    Staphylococcus aureus (BCID) DETECTED (A) NOT DETECTED Final    Comment: Methicillin (  oxacillin)-resistant Staphylococcus aureus (MRSA). MRSA is predictably resistant to beta-lactam antibiotics (except ceftaroline). Preferred therapy is vancomycin unless clinically contraindicated. Patient requires contact precautions if  hospitalized. CRITICAL RESULT CALLED TO, READ BACK BY AND VERIFIED WITH: PHARMD HALEY V. [id number] FCP    Staphylococcus epidermidis NOT DETECTED NOT DETECTED Final    Staphylococcus lugdunensis NOT DETECTED NOT DETECTED Final   Streptococcus species NOT DETECTED NOT DETECTED Final   Streptococcus agalactiae NOT DETECTED NOT DETECTED Final   Streptococcus pneumoniae NOT DETECTED NOT DETECTED Final   Streptococcus pyogenes NOT DETECTED NOT DETECTED Final   A.calcoaceticus-baumannii NOT DETECTED NOT DETECTED Final   Bacteroides fragilis NOT DETECTED NOT DETECTED Final   Enterobacterales NOT DETECTED NOT DETECTED Final   Enterobacter cloacae complex NOT DETECTED NOT DETECTED Final   Escherichia coli NOT DETECTED NOT DETECTED Final   Klebsiella aerogenes NOT DETECTED NOT DETECTED Final   Klebsiella oxytoca NOT DETECTED NOT DETECTED Final   Klebsiella pneumoniae NOT DETECTED NOT DETECTED Final   Proteus species NOT DETECTED NOT DETECTED Final   Salmonella species NOT DETECTED NOT DETECTED Final   Serratia marcescens NOT DETECTED NOT DETECTED Final   Haemophilus influenzae NOT DETECTED NOT DETECTED Final   Neisseria meningitidis NOT DETECTED NOT DETECTED Final   Pseudomonas aeruginosa NOT DETECTED NOT DETECTED Final   Stenotrophomonas maltophilia NOT DETECTED NOT DETECTED Final   Candida albicans NOT DETECTED NOT DETECTED Final   Candida auris NOT DETECTED NOT DETECTED Final   Candida glabrata NOT DETECTED NOT DETECTED Final   Candida krusei NOT DETECTED NOT DETECTED Final   Candida parapsilosis NOT DETECTED NOT DETECTED Final   Candida tropicalis NOT DETECTED NOT DETECTED Final   Cryptococcus neoformans/gattii NOT DETECTED NOT DETECTED Final   Meth resistant mecA/C and MREJ DETECTED (A) NOT DETECTED Final    Comment: CRITICAL RESULT CALLED TO, READ BACK BY AND VERIFIED WITH: Jonn Shingles [id number] FCP Performed at [facility], [address] [place], [place] [address]   Body fluid culture w Gram Stain     Status: None   Collection Time: 03/29/21 12:10 AM   Specimen: Body Fluid  Result Value Ref Range Status   Specimen Description FLUID  LEFT KNEE  Final   Special Requests SYRINGE  Final   Gram Stain FEW WBC SEEN NO ORGANISMS SEEN   Final   Culture   Final    NO GROWTH 3 DAYS Performed at [facility], [address], [place], [place] [address]    Report Status 04/01/2021 FINAL  Final  Blood Culture (routine x 2)     Status: None   Collection Time: 03/29/21  3:00 AM   Specimen: BLOOD  Result Value Ref Range Status   Specimen Description BLOOD SITE NOT SPECIFIED  Final   Special Requests   Final    BOTTLES DRAWN AEROBIC AND ANAEROBIC Blood Culture adequate volume   Culture   Final    NO GROWTH 5 DAYS Performed at [facility], [place] [address] [place], [place] [address]    Report Status 04/03/2021 FINAL  Final  Culture, blood (Routine X 2) w Reflex to ID Panel     Status: None   Collection Time: 03/31/21  7:00 AM   Specimen: BLOOD RIGHT ARM  Result Value Ref Range Status   Specimen Description BLOOD RIGHT ARM  Final   Special Requests   Final    BOTTLES DRAWN AEROBIC AND ANAEROBIC Blood Culture adequate volume   Culture  Final    NO GROWTH 5 DAYS Performed at [facility], [place] [address], [place], [place] [address]    Report Status 04/07/2021 FINAL  Final  Culture, blood (Routine X 2) w Reflex to ID Panel     Status: None   Collection Time: 03/31/21  7:00 AM   Specimen: BLOOD RIGHT ARM  Result Value Ref Range Status   Specimen Description BLOOD RIGHT ARM  Final   Special Requests   Final    BOTTLES DRAWN AEROBIC AND ANAEROBIC Blood Culture adequate volume   Culture   Final    NO GROWTH 5 DAYS Performed at [facility], [place] [address], [place], [place] [address]    Report Status 04/07/2021 FINAL  Final         Radiology Studies: No results found.    Scheduled Meds: . (feeding supplement) PROSource Plus  30 mL Oral BID BM  . sodium chloride   Intravenous Once  . sodium chloride   Intravenous Once  . sodium chloride   Intravenous Once  . atorvastatin  40 mg Oral QHS   . calcium acetate  1,334 mg Oral TID with meals  . carvedilol  12.5 mg Oral BID WC  . Chlorhexidine Gluconate Cloth  6 each Topical Q0600  . darbepoetin (ARANESP) injection - DIALYSIS  100 mcg Intravenous Q Wed-HD  . docusate sodium  100 mg Oral Daily  . insulin aspart  0-15 Units Subcutaneous TID WC  . insulin aspart  0-5 Units Subcutaneous QHS  . [START ON 04/08/2021] insulin regular human CONCENTRATED  110 Units Subcutaneous Q breakfast   And  . insulin regular human CONCENTRATED  45 Units Subcutaneous Q supper  . mouth rinse  15 mL Mouth Rinse BID  . nortriptyline  10 mg Oral QHS  . pantoprazole  40 mg Oral Daily  . polyethylene glycol  17 g Oral BID  . senna  1 tablet Oral Daily  . sertraline  100 mg Oral Daily   Continuous Infusions: . magnesium sulfate bolus IVPB    . [START ON 04/09/2021] vancomycin       LOS: 9 days      Debbe Odea, MD [facility] Pager: [email or web address] 04/07/2021, 6:02 PM

## 2021-04-08 DIAGNOSIS — A419 Sepsis, unspecified organism: Secondary | ICD-10-CM | POA: Diagnosis not present

## 2021-04-08 DIAGNOSIS — R7881 Bacteremia: Secondary | ICD-10-CM | POA: Diagnosis not present

## 2021-04-08 DIAGNOSIS — F4310 Post-traumatic stress disorder, unspecified: Secondary | ICD-10-CM | POA: Diagnosis not present

## 2021-04-08 DIAGNOSIS — E1169 Type 2 diabetes mellitus with other specified complication: Secondary | ICD-10-CM | POA: Diagnosis not present

## 2021-04-08 DIAGNOSIS — D649 Anemia, unspecified: Secondary | ICD-10-CM

## 2021-04-08 LAB — TYPE AND SCREEN
ABO/RH(D): A NEG
Antibody Screen: NEGATIVE
Unit division: 0
Unit division: 0
Unit division: 0
Unit division: 0
Unit division: 0

## 2021-04-08 LAB — BPAM RBC
Blood Product Expiration Date: 202205132359
Blood Product Expiration Date: 202205142359
Blood Product Expiration Date: 202205162359
Blood Product Expiration Date: 202206012359
Blood Product Expiration Date: 202206032359
ISSUE DATE / TIME: 202205091453
ISSUE DATE / TIME: 202205092340
ISSUE DATE / TIME: 202205100943
ISSUE DATE / TIME: 202205101827
ISSUE DATE / TIME: 202205111048
Unit Type and Rh: 600
Unit Type and Rh: 600
Unit Type and Rh: 600
Unit Type and Rh: 9500
Unit Type and Rh: 9500

## 2021-04-08 LAB — GLUCOSE, CAPILLARY
Glucose-Capillary: 288 mg/dL — ABNORMAL HIGH (ref 70–99)
Glucose-Capillary: 167 mg/dL — ABNORMAL HIGH (ref 70–99)
Glucose-Capillary: 85 mg/dL (ref 70–99)
Glucose-Capillary: 152 mg/dL — ABNORMAL HIGH (ref 70–99)

## 2021-04-08 MED ORDER — HYDROMORPHONE HCL 1 MG/ML IJ SOLN
0.5000 mg | Freq: Once | INTRAMUSCULAR | Status: AC
  Administered 2021-04-08: 0.5 mg via INTRAVENOUS
  Filled 2021-04-08: qty 0.5

## 2021-04-08 MED ORDER — DARBEPOETIN ALFA 150 MCG/0.3ML IJ SOSY
150.0000 ug | PREFILLED_SYRINGE | INTRAMUSCULAR | Status: DC
  Administered 2021-04-09: 150 ug via INTRAVENOUS
  Filled 2021-04-08: qty 0.3

## 2021-04-08 NOTE — Progress Notes (Addendum)
[facility] Progress Note   Subjective:   Seen in room. Up in recliner. Has customer service complaints Transfused another unit prbcs on dialysis yesterday.   Objective Vitals:   04/07/21 1953 04/07/21 2343 04/08/21 0350 04/08/21 0728  BP: (!) 122/48 (!) 108/51 129/66 (!) 147/56  Pulse: 91 78 82 (!) 59  Resp: '18 17 18 20  '$ Temp: 99 F (37.2 C) 98.8 F (37.1 C) 97.6 F (36.4 C)   TempSrc: Oral Oral Oral   SpO2: 96% 95% 96% 100%  Weight:      Height:       Physical Exam General:Well appearing man, NAD.  Heart:RRR; no murmur Lungs:CTA anteriorly Abdomen:soft Extremities:L AKA bandaged with wound vac, no RLE edema Dialysis Access:RUE AVG + bruit  Additional Objective Labs: Basic Metabolic Panel: Recent Labs  Lab 04/05/21 0822 04/06/21 1616 04/07/21 0953  NA 129* 128* 128*  K 3.7 4.2 4.5  CL 94* 94* 94*  CO2 '23 25 22  '$ GLUCOSE 156* 387* 281*  BUN 104* 53* 58*  CREATININE 4.39* 3.45* 3.30*  CALCIUM 8.7* 8.3* 8.7*  PHOS 6.6*  --  5.4*   Liver Function Tests: Recent Labs  Lab 04/05/21 0822 04/07/21 0953  ALBUMIN 2.4* 2.6*   CBC: Recent Labs  Lab 04/02/21 0634 04/03/21 1226 04/05/21 1030 04/05/21 2217 04/06/21 1616 04/07/21 0032  WBC 5.7  --  11.1* 11.1* 10.5 9.9  NEUTROABS  --   --   --   --  8.0*  --   HGB 9.4*   < > 4.5* 5.4* 6.8* 7.4*  HCT 30.5*   < > 14.0* 16.4* 20.9* 22.6*  MCV 94.1  --  90.9 85.4 88.9 86.6  PLT 91*  --  226 195 215 211   < > = values in this interval not displayed.   Studies/Results: No results found. Medications: . magnesium sulfate bolus IVPB    . [START ON 04/09/2021] vancomycin     . (feeding supplement) PROSource Plus  30 mL Oral BID BM  . sodium chloride   Intravenous Once  . sodium chloride   Intravenous Once  . sodium chloride   Intravenous Once  . atorvastatin  40 mg Oral QHS  . calcium acetate  1,334 mg Oral TID with meals  . carvedilol  12.5 mg Oral BID WC  . Chlorhexidine Gluconate  Cloth  6 each Topical Q0600  . darbepoetin (ARANESP) injection - DIALYSIS  100 mcg Intravenous Q Wed-HD  . docusate sodium  100 mg Oral Daily  . insulin aspart  0-15 Units Subcutaneous TID WC  . insulin aspart  0-5 Units Subcutaneous QHS  . insulin regular human CONCENTRATED  110 Units Subcutaneous Q breakfast   And  . insulin regular human CONCENTRATED  45 Units Subcutaneous Q supper  . mouth rinse  15 mL Mouth Rinse BID  . nortriptyline  10 mg Oral QHS  . pantoprazole  40 mg Oral Daily  . polyethylene glycol  17 g Oral BID  . senna  1 tablet Oral Daily  . sertraline  100 mg Oral Daily    Dialysis Orders: [facility] MWF  4h 121kg 3K/2.5 bath RUE AVG/ R IJ TDC Hep 4500+ 2561md -Mircera 100 ug q2 last 4/27  Assessment/Plan: 1. MRSA bacteremia: BCx 5/1 +, repeat BCx 5/3 negative. On Vancomycin.TDC removed 5/3, no need to replace as AVG in use. 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 2. ESRD:Continue HD per usual MWF  schedule - next HD 5/13. Hold heparin  3. HD access:RUE AVG placed 01/30/21 also at [facility]. Had fistulogram 4/14 that showed patent AVG.TDC removed per IR 5/3, AVG in use. 4. Anemiaof ESRD/ABLA : Likely post-op blood loss. Hgb 7.4 S/p 2 units prbcs 5/9. 1 unit 5/11  Trend HH as patient allows. Due for next dose ESA on 5/11-will order  5. HTN/ volume:BP stable, EDW will need to be lowered s/p AKA. 6. PTSD:Continue home meds. 7. COPD:Per primary. 8. DM2: Per primary, on insulin. 9. MBD:Ca ok,Phoshigh - Phoslo ^'d to 2/meals. No VDRA. 10. Nutrition: Alb low, continue supplements. 11. Dispo: For SNF placement    Ogechi [name] PA-C [facility] 04/08/2021,11:19 AM   Seen and examined independently this AM.  Agree with note and exam as documented above by physician extender and as noted here.   He is rather frustrated.  He states that it is too early to be talking with him (seen at 7:25  am), inappropriate to be too "cheerful", and he states that my questions about where he is going after discharge (I.e. SNF or rehab) are "none of [my] business" because I am just taking care of his dialysis.    General adult male in bed in no acute distress HEENT normocephalic atraumatic extraocular movements intact sclera anicteric Neck supple trachea midline Lungs clear to auscultation bilaterally normal work of breathing at rest  Heart S1S2 no rub Abdomen soft nontender nondistended Extremities left AKA; nonpitting edema  Psych he is frustrated Neuro alert and oriented x 3 provides hx and follows commands Access: Right arm graft with bruit   ESRD on HD - HD per MWF schedule. He has no labs today - these are ordered  Anemia and acute blood loss - has required multiple units of PRBC's.  He appears to have missed his aranesp for 5/11 - set 150 mcg on 5/13 with HD  MRSA bacteremia - tunneled catheter out and using his graft now. S/p left AKA  HTN - note he will have a lower EDW as he is s/p left AKA   I spoke with the HD SW this AM to update her for discharge planning needs.   Claudia Desanctis, MD 04/08/2021 12:17 PM

## 2021-04-08 NOTE — Progress Notes (Signed)
Renal Navigator will collaborate with TOC CSW/N. Rayyan to ensure that patient's outpatient HD schedule is conducive to plan for discharge to SNF.  Navigator will follow.   Alphonzo Cruise, [place] Renal Navigator [phone]

## 2021-04-08 NOTE — Progress Notes (Signed)
PROGRESS NOTE    Tavious Caraher   [id number]  DOB: [birth year]  DOA: 03/28/2021 PCP: Merryl Hacker, No   Brief Narrative:  [name] 60 year old with past medical history significant for ESRD,  diabetes presents to the ED complaining of fever.  Accompanied by vomiting, diaphoresis and mild confusion.  He has been having left knee pain.  Patient has a history of chronic knee  pain and swelling left knee, prior imagine at [facility].  Patient has a history of knee replacement/hardware bilaterally.   In the ED left knee imaging showed massive knee joint effusion, Temp 103 degrees, WBC 12K. Patient was started empirically on IV antibiotics.  Patient was evaluated by orthopedic surgery, who is recommending AKA.   He was also diagnosed with MRSA bacteremia & underwent hemodialysis catheter removal.  Currently has RUE AVG (01/2021).  ID and orthopedic consulted. Underwent Left AKA by Dr Lucia Gaskins  on 5/7. Patient decline TEE. ID recommends IV Vancomycin through June 18 with HD.   Likely Acute blood loss anemia, post surgery. Patient refused labs 04/04/2021. Labs  On 5/09 showed Hb 4.5.     Subjective: No new complaints.    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- recommendations were to perform an AKA vs transfer to a tertiary care facility- patient decided on AKA - left AKA 5/7 - cont wound vac x 5 days (today is last day)- will wait for Dr Lucia Gaskins to remove tomorrow - bloody drainage noted in vac  Hyponatremia - follow with dialysis  Anemia - drop noted from 8.8 on 5/7 to 4.5 on 5/9- no blood draw on 5/8  - due to ESRD, acute infection and possibly acute blood loss from AKA - 3 U PRBC required for HB of 4.5 which has improved to 7.4 by  5/11 - unable to get blood today and he did not allow further blood draws- will need to check again tomorrow in HD  DM 2 - increased AM insulin on 5/11 as sugars high in PM-  follow     ESRD (end stage renal disease) -cont dialysis per nephrology - HD cath removed  Hypersomnolence - resolved after holding Gabapentin, Nortriptyline trazodone, Valium   Morbid obesity   Body mass index is 41.09 kg/m.    Time spent in minutes: 35 DVT prophylaxis: SCD's Start: 04/04/21 0402 SCDs Start: 03/29/21 0842 Code Status: Full code Family Communication:  Level of Care: Level of care: Progressive Disposition Plan:  Status is: Inpatient  Remains inpatient appropriate because:Inpatient level of care appropriate due to severity of illness   Dispo: The patient is from: Home              Anticipated d/c is to: SNF              Patient currently is not medically stable to d/c.   Difficult to place patient No      Consultants:   ID  Nephrology  Orthopedic surgery Procedures:   Left AKA Antimicrobials:  Anti-infectives (From admission, onward)   Start     Dose/Rate Route Frequency Ordered Stop   04/09/21 1200  vancomycin (VANCOCIN) IVPB 1000 mg/200 mL premix        1,000 mg 200  mL/hr over 60 Minutes Intravenous Every M-W-F (Hemodialysis) 04/07/21 1140     04/07/21 1145  vancomycin (VANCOREADY) IVPB 1500 mg/300 mL        1,500 mg 150 mL/hr over 120 Minutes Intravenous To Hemodialysis 04/07/21 1140 04/07/21 1428   04/03/21 1130  ceFAZolin (ANCEF) IVPB 3g/100 mL premix  Status:  Discontinued        3 g 200 mL/hr over 30 Minutes Intravenous On call to O.R. 04/03/21 1008 04/03/21 1459   04/03/21 1010  ceFAZolin (ANCEF) 3-0.9 GM/100ML-% IVPB       Note to Pharmacy: Baird Lyons  : cabinet override      04/03/21 1010 04/03/21 2214   04/03/21 0915  vancomycin (VANCOREADY) IVPB 750 mg/150 mL        750 mg 150 mL/hr over 60 Minutes Intravenous  Once 04/03/21 0829 04/03/21 1622    03/29/21 2000  ceFEPIme (MAXIPIME) 2 g in sodium chloride 0.9 % 100 mL IVPB  Status:  Discontinued        2 g 200 mL/hr over 30 Minutes Intravenous Every M-W-F (2000) 03/29/21 0321 03/29/21 1541   03/29/21 1200  vancomycin (VANCOCIN) IVPB 1000 mg/200 mL premix  Status:  Discontinued        1,000 mg 200 mL/hr over 60 Minutes Intravenous Every M-W-F (Hemodialysis) 03/29/21 0321 04/07/21 1140   03/29/21 0000  vancomycin (VANCOCIN) 2,500 mg in sodium chloride 0.9 % 500 mL IVPB        2,500 mg 250 mL/hr over 120 Minutes Intravenous  Once 03/28/21 2346 03/29/21 0340   03/29/21 0000  ceFEPIme (MAXIPIME) 2 g in sodium chloride 0.9 % 100 mL IVPB        2 g 200 mL/hr over 30 Minutes Intravenous  Once 03/28/21 2346 03/29/21 0126       Objective: Vitals:   04/07/21 2343 04/08/21 0350 04/08/21 0728 04/08/21 1231  BP: (!) 108/51 129/66 (!) 147/56 (!) 141/67  Pulse: 78 82 (!) 59 80  Resp: '17 18 20 20  '$ Temp: 98.8 F (37.1 C) 97.6 F (36.4 C)    TempSrc: Oral Oral    SpO2: 95% 96% 100% 98%  Weight:      Height:        Intake/Output Summary (Last 24 hours) at 04/08/2021 1559 Last data filed at 04/07/2021 1600 Gross per 24 hour  Intake 292.9 ml  Output --  Net 292.9 ml   Filed Weights   04/05/21 1215 04/07/21 0900 04/07/21 1306  Weight: 115 kg 114.6 kg 112 kg    Examination: General exam: Appears comfortable  HEENT: PERRLA, oral mucosa moist, no sclera icterus or thrush Respiratory system: Clear to auscultation. Respiratory effort normal. Cardiovascular system: S1 & S2 heard, RRR.   Gastrointestinal system: Abdomen soft, non-tender, nondistended. Normal bowel sounds. Central nervous system: Alert and oriented. No focal neurological deficits. Extremities: No cyanosis, clubbing or edema- wound vac on left stump noted Skin: No rashes or ulcers Psychiatry:  Mood & affect appropriate.     Data Reviewed: I have personally reviewed following labs and imaging studies  CBC: Recent  Labs  Lab 04/02/21 0634 04/03/21 1226 04/05/21 1030 04/05/21 2217 04/06/21 1616 04/07/21 0032  WBC 5.7  --  11.1* 11.1* 10.5 9.9  NEUTROABS  --   --   --   --  8.0*  --   HGB 9.4* 8.8* 4.5* 5.4* 6.8* 7.4*  HCT 30.5* 26.0* 14.0* 16.4* 20.9* 22.6*  MCV 94.1  --  90.9 85.4 88.9 86.6  PLT 91*  --  226 195 215 [id number]   Basic Metabolic Panel: Recent Labs  Lab 04/02/21 0634 04/03/21 1226 04/05/21 0822 04/06/21 1616 04/07/21 0953  NA 127* 129* 129* 128* 128*  K 4.0 4.9 3.7 4.2 4.5  CL 92* 99 94* 94* 94*  CO2 22  --  '23 25 22  '$ GLUCOSE 290* 270* 156* 387* 281*  BUN 79* 72* 104* 53* 58*  CREATININE 4.03* 3.80* 4.39* 3.45* 3.30*  CALCIUM 9.0  --  8.7* 8.3* 8.7*  PHOS  --   --  6.6*  --  5.4*   GFR: Estimated Creatinine Clearance: 27.5 mL/min (A) (by C-G formula based on SCr of 3.3 mg/dL (H)). Liver Function Tests: Recent Labs  Lab 04/05/21 0822 04/07/21 0953  ALBUMIN 2.4* 2.6*   No results for input(s): LIPASE, AMYLASE in the last 168 hours. No results for input(s): AMMONIA in the last 168 hours. Coagulation Profile: No results for input(s): INR, PROTIME in the last 168 hours. Cardiac Enzymes: No results for input(s): CKTOTAL, CKMB, CKMBINDEX, TROPONINI in the last 168 hours. BNP (last 3 results) No results for input(s): PROBNP in the last 8760 hours. HbA1C: No results for input(s): HGBA1C in the last 72 hours. CBG: Recent Labs  Lab 04/07/21 0759 04/07/21 1702 04/07/21 2043 04/08/21 0727 04/08/21 1230  GLUCAP 245* 338* 306* 288* 167*   Lipid Profile: No results for input(s): CHOL, HDL, LDLCALC, TRIG, CHOLHDL, LDLDIRECT in the last 72 hours. Thyroid Function Tests: No results for input(s): TSH, T4TOTAL, FREET4, T3FREE, THYROIDAB in the last 72 hours. Anemia Panel: No results for input(s): VITAMINB12, FOLATE, FERRITIN, TIBC, IRON, RETICCTPCT in the last 72 hours. Urine analysis:    Component Value Date/Time   COLORURINE AMBER (A) 03/30/2021 0239   APPEARANCEUR  HAZY (A) 03/30/2021 0239   LABSPEC 1.018 03/30/2021 0239   PHURINE 5.0 03/30/2021 0239   GLUCOSEU NEGATIVE 03/30/2021 0239   HGBUR NEGATIVE 03/30/2021 0239   BILIRUBINUR NEGATIVE 03/30/2021 0239   KETONESUR NEGATIVE 03/30/2021 0239   PROTEINUR NEGATIVE 03/30/2021 0239   NITRITE NEGATIVE 03/30/2021 0239   LEUKOCYTESUR NEGATIVE 03/30/2021 0239   Sepsis Labs: '@LABRCNTIP'$ (procalcitonin:4,lacticidven:4) ) Recent Results (from the past 240 hour(s))  Culture, blood (Routine X 2) w Reflex to ID Panel     Status: None   Collection Time: 03/31/21  7:00 AM   Specimen: BLOOD RIGHT ARM  Result Value Ref Range Status   Specimen Description BLOOD RIGHT ARM  Final   Special Requests   Final    BOTTLES DRAWN AEROBIC AND ANAEROBIC Blood Culture adequate volume   Culture   Final    NO GROWTH 5 DAYS Performed at [facility], [place] [address] [place], [place] [address]    Report Status 04/07/2021 FINAL  Final  Culture, blood (Routine X 2) w Reflex to ID Panel     Status: None   Collection Time: 03/31/21  7:00 AM   Specimen: BLOOD RIGHT ARM  Result Value Ref Range Status   Specimen Description BLOOD RIGHT ARM  Final   Special Requests   Final    BOTTLES DRAWN AEROBIC AND ANAEROBIC Blood Culture adequate volume   Culture   Final    NO GROWTH 5 DAYS Performed at [facility], [place] [address] [place], [place] [address]    Report Status 04/07/2021 FINAL  Final         Radiology Studies: No results found.    Scheduled Meds: . (feeding supplement) PROSource Plus  30 mL Oral BID BM  . sodium chloride   Intravenous Once  . sodium chloride   Intravenous Once  . sodium chloride   Intravenous Once  . atorvastatin  40 mg Oral QHS  . calcium acetate  1,334 mg Oral TID with meals  . carvedilol  12.5 mg Oral BID WC  . Chlorhexidine Gluconate Cloth  6 each Topical Q0600  . [START ON 04/09/2021] darbepoetin (ARANESP) injection - DIALYSIS  150 mcg Intravenous Q Fri-HD  . docusate sodium   100 mg Oral Daily  . insulin aspart  0-15 Units Subcutaneous TID WC  . insulin aspart  0-5 Units Subcutaneous QHS  . insulin regular human CONCENTRATED  110 Units Subcutaneous Q breakfast   And  . insulin regular human CONCENTRATED  45 Units Subcutaneous Q supper  . mouth rinse  15 mL Mouth Rinse BID  . nortriptyline  10 mg Oral QHS  . pantoprazole  40 mg Oral Daily  . polyethylene glycol  17 g Oral BID  . senna  1 tablet Oral Daily  . sertraline  100 mg Oral Daily   Continuous Infusions: . magnesium sulfate bolus IVPB    . [START ON 04/09/2021] vancomycin       LOS: 10 days      Debbe Odea, MD [facility] Pager: [email or web address] 04/08/2021, 3:59 PM

## 2021-04-08 NOTE — TOC Progression Note (Addendum)
Transition of Care [facility]) - Progression Note    Patient Details  Name: Jeffrey [name] MRN: [id number] Date of Birth: 1960-12-03  Transition of Care [facility]) CM/SW [place], LCSW Phone Number: 04/08/2021, 11:26 AM  Clinical Narrative:    CSW spoke with patient regarding SNF bed offer. He stated that he wants to go to [facility] in [place] explained barriers to getting him to [place], including having to switch his dialysis to [facility] and that he would need transportation to [facility] as PTAR would be an up front cost. Patient stated we can switch his dialysis to [facility] because he was there before and only moved to [place] because his daughter is going to school here. He stated his family will have to transport him then. CSW asked him if family could be updated and he stated his wife, Jeffrey [name]. He stated CSW would do better to talk to her than him anyway because he "does not like people". CSW reported understanding.   -CSW spoke with [place] at [place] [phone]). She stated she can review referral but has a few to go through first. CSW faxed referral (f. [phone]). CSW confirmed receipt with [place].   -CSW requested [facility] review referral. They are able to accept patient pending a covid test and dialysis set up in [place] spoke with [facility] in [place] and they are unable to accept patient with dialysis.  -CSW spoke with [facility] (f. [phone]) and will fax referral over for review.  -CSW spoke with [place] at [place]. Their [employer] building is full but she will review for their [facility] location (f. [phone]).  -CSW left voicemail for [facility] and faxed referral (f. [phone]).  -CSW left voicemail for [facility] at [place] (f. [phone]).  CSW spoke with patient's spouse. She reiterated that the patient would be going to SNF in [place], not [place], as they were not familiar with  [place] facilities and the goal is to get patient back to [place] also explained potential barriers to her but she stated they were issues the hospital could address.   CSW updated Renal Navigator, [place], regarding potential to switch dialysis centers to [facility] location.   CSW submitted clinicals to [employer] pending a facility choice: Pending ID#: [id number]     Expected Discharge Plan: [place] Barriers to Discharge: Insurance Authorization,Continued Medical Work up  Expected Discharge Plan and Services Expected Discharge Plan: [place] In-house Referral: Clinical Social Work   Post Acute Care Choice: [place] Living arrangements for the past 2 months: [place] Determinants of Health (SDOH) Interventions    Readmission Risk Interventions Readmission Risk Prevention Plan 04/07/2021  Transportation Screening Complete  Medication Review Press photographer) Referral to Pharmacy  PCP or Specialist appointment within 3-5 days of discharge Complete  HRI or Home Care Consult Complete  SW Recovery Care/Counseling Consult Complete  Palliative Care Screening Not [place] Complete

## 2021-04-08 NOTE — Care Management Important Message (Signed)
Important Message  Patient Details  Name: Adelaido Kleinfeld MRN: [id number] Date of Birth: September 01, 1961   Medicare Important Message Given:  Yes - Important Message mailed due to current National Emergency  Verbal consent obtained due to current National Emergency  Relationship to patient: Self Contact Name: Jian Call Date: 04/08/21  Time: [id number] Phone: [phone] Outcome: No Answer/Busy Important Message mailed to: Patient address on file    Delorse Lek 04/08/2021, 2:13 PM

## 2021-04-08 NOTE — Progress Notes (Signed)
Occupational Therapy Treatment Patient Details Name: Jeffrey [name] MRN: [id number] DOB: October 15, 1961 Today's Date: 04/08/2021    History of present illness 60 y.o. male presents to [facility] ED on 03/28/2021 with reports of fever, chills, nausea/vomiting, confusion. Pt reports significant L knee pain, imaging revealing massive effusion. 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 PMH includes ESRD, CHF, COPD, DMII, HTN, PTSD.   OT comments  On entry, pt very agitated this AM due to MD waking him up this AM (and possibly due to attempted blood draws prior?). Pt mood did not improve with encouragement, making rude comments and yelling at this therapist during session. Per pt request, coordinated with RN for pain meds prior to OOB attempts though pt then changed his mind once sitting EOB, requesting pain meds after transfer to chair (?). Due to agitation, pt not receptive to OT education for LB ADL strategies this AM but was able to demo lateral leans without assist for managing gown EOB. Pt ultimately able to demonstrate scoot transfer bed > chair at min guard with bed slightly elevated. Attempted to assist pt in getting comfortable in chair, but pt declined all efforts, saying "there's nothing comfortable about this chair".  Of note, pt now reporting he would like to see PT/OT at the same time rather than having sessions spread out to avoid off days. Contradictory statement to what pt previously told PT. Will continue efforts and collaboration with pt for preferences.  Follow Up Recommendations  SNF    Equipment Recommendations  3 in 1 bedside commode;Tub/shower bench;Wheelchair (measurements OT);Wheelchair cushion (measurements OT) (bariatric DME)    Recommendations for Other Services      Precautions / Restrictions Precautions Precautions: Fall Precaution Comments: wound vac Restrictions Weight Bearing Restrictions: Yes LLE Weight Bearing: Non weight bearing        Mobility Bed Mobility Overal bed mobility: Needs Assistance Bed Mobility: Supine to Sit   Sidelying to sit: Min guard;HOB elevated       General bed mobility comments: min guard with increased time/effort to scoot to EOB, good problem solving use of bedrail    Transfers Overall transfer level: Needs assistance Equipment used: 1 person hand held assist Transfers: Lateral/Scoot Transfers          Lateral/Scoot Transfers: Min guard;From elevated surface General transfer comment: min guard for scoot transfer from bed to recliner with bed slightly elevated. Cues for sequencing/scoot techniques    Balance Overall balance assessment: Needs assistance Sitting-balance support: No upper extremity supported;Feet supported Sitting balance-Leahy Scale: Fair                                     ADL either performed or assessed with clinical judgement   ADL Overall ADL's : Needs assistance/impaired                                       General ADL Comments: Pt able to demo lateral leans without assist to pull gown out from under bottom to maximize ability to complete transfers. Due to agitation, pt not receptive to OT education on LB ADLs today     Vision   Vision Assessment?: No apparent visual deficits   Perception     Praxis      Cognition Arousal/Alertness: Awake/alert Behavior During Therapy: Agitated Overall  Cognitive Status: Impaired/Different from baseline Area of Impairment: Safety/judgement;Awareness                         Safety/Judgement: Decreased awareness of safety Awareness: Emergent   General Comments: Pt very agitated this AM due to interactions with MD this morning - did not respond well to therapist encouragement and rude to therapist throughout. Asked for pain meds prior to OOB attempts (notified RN who was present to give meds), then pt yelled wanting to do transfer, then receive pain meds. Contradictory  statements made throughout        Exercises     Shoulder Instructions       General Comments Pt very agitated, reports wife wants him to sit up in chair but it is uncomfortable - though denied any OT assist to improve comfort (pillows, etc). RN present during session    Pertinent Vitals/ Pain       Pain Assessment: Faces Faces Pain Scale: Hurts even more Pain Location: L residual limb Pain Descriptors / Indicators: Discomfort;Grimacing;Operative site guarding Pain Intervention(s): Limited activity within patient's tolerance;Monitored during session;Patient requesting pain meds-RN notified;RN gave pain meds during session;Repositioned  Home Living                                          Prior Functioning/Environment              Frequency  Min 2X/week        Progress Toward Goals  OT Goals(current goals can now be found in the care plan section)  Progress towards OT goals: Progressing toward goals  Acute Rehab OT Goals Patient Stated Goal: be able to walk again OT Goal Formulation: With patient Time For Goal Achievement: 04/20/21 Potential to Achieve Goals: Good ADL Goals Pt Will Perform Grooming: with min guard assist;standing Pt Will Perform Lower Body Bathing: with min assist;sitting/lateral leans;sit to/from stand Pt Will Perform Lower Body Dressing: with min assist;sitting/lateral leans;sit to/from stand Pt Will Transfer to Toilet: with min assist;squat pivot transfer;stand pivot transfer;bedside commode Pt Will Perform Toileting - Clothing Manipulation and hygiene: with min assist;sitting/lateral leans;sit to/from stand  Plan Discharge plan needs to be updated    Co-evaluation                 AM-PAC OT "6 Clicks" Daily Activity     Outcome Measure   Help from another person eating meals?: None Help from another person taking care of personal grooming?: A Little Help from another person toileting, which includes using toliet,  bedpan, or urinal?: A Lot Help from another person bathing (including washing, rinsing, drying)?: A Lot Help from another person to put on and taking off regular upper body clothing?: A Little Help from another person to put on and taking off regular lower body clothing?: A Lot 6 Click Score: 16    End of Session Equipment Utilized During Treatment: Gait belt  OT Visit Diagnosis: Unsteadiness on feet (R26.81);Other abnormalities of gait and mobility (R26.89);Muscle weakness (generalized) (M62.81);Pain Pain - Right/Left: Left Pain - part of body: Leg   Activity Tolerance Treatment limited secondary to agitation   Patient Left in chair;with call bell/phone within reach;with chair alarm set;with nursing/sitter in room   Nurse Communication Mobility status;Other (comment);Patient requests pain meds (RN present during session)        Time: [id number] OT Time  Calculation (min): 24 min  Charges: OT General Charges $OT Visit: 1 Visit OT Treatments $Self Care/Home Management : 8-22 mins $Therapeutic Activity: 8-22 mins  Malachy Chamber, OTR/L Acute Rehab Services Office: [phone]   Layla Maw 04/08/2021, 9:32 AM

## 2021-04-08 NOTE — Progress Notes (Signed)
Physical Therapy Treatment Patient Details Name: Jeffrey [name] MRN: [id number] DOB: [birth year] Today's Date: 04/08/2021    History of Present Illness 60 y.o. male presents to [facility] ED on 03/28/2021 with reports of fever, chills, nausea/vomiting, confusion. Pt reports significant L knee pain, imaging revealing massive effusion. 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 PMH includes ESRD, CHF, COPD, DMII, HTN, PTSD.    PT Comments    Patient received in chair, not as agitated as this morning but definitely annoyed with therapist- cooperative, but does try to dictate session and how things are done (for example, refuses to use RW and insists on stedy). Still needs two person assist to stand and use stedy to pivot back to bed and return to supine due to pain. Positioned to comfort as much as he would allow [place]. Now requesting that PT/OT come at the same time, which is inconsistent with prior requests that we come separately. Left in bed with all needs met, alarm active, RN aware of patient status.    Follow Up Recommendations  SNF     Equipment Recommendations  Rolling walker with 5" wheels;Hospital bed;Other (comment)    Recommendations for Other Services       Precautions / Restrictions Precautions Precautions: Fall Precaution Comments: wound vac, new L AKA Restrictions Weight Bearing Restrictions: Yes LLE Weight Bearing: Non weight bearing    Mobility  Bed Mobility Overal bed mobility: Needs Assistance Bed Mobility: Sit to Supine       Sit to supine: Min assist;+2 for physical assistance   General bed mobility comments: MinAx2 for management of trunk and BLEs with return to supine, increased time due to pain    Transfers Overall transfer level: Needs assistance Equipment used: Ambulation equipment used Transfers: Sit to/from [facility] Sit to Stand: Mod assist;+2 physical assistance Stand pivot transfers: Total assist;+2  physical assistance       General transfer comment: ModAx2 to boost to full upright standing in the stedy, then totalAx2 in stedy for pivot back to bed  Ambulation/Gait             General Gait Details: unable   Stairs             Wheelchair Mobility    Modified Rankin (Stroke Patients Only)       Balance Overall balance assessment: Needs assistance Sitting-balance support: No upper extremity supported;Feet supported Sitting balance-Leahy Scale: Fair Sitting balance - Comments: fair static sitting   Standing balance support: Bilateral upper extremity supported;During functional activity Standing balance-Leahy Scale: Poor                              Cognition Arousal/Alertness: Awake/alert Behavior During Therapy: Flat affect Overall Cognitive Status: Impaired/Different from baseline Area of Impairment: Safety/judgement;Awareness                         Safety/Judgement: Decreased awareness of safety Awareness: Emergent   General Comments: not as agitated this afternoon- just annoyed with therapist and yells at the PT tech 'tell her what she did!" multiple times, referring to me after I hit mute on his TV remote. Tries to dictate how session will run and refused to try using RW.      Exercises      General Comments General comments (skin integrity, edema, etc.): very annoyed with therapist today, but participated- does try to  dictate session. Denied PT efforts to improve comfort. RN present at [place].      Pertinent Vitals/Pain Pain Assessment: 0-10 Pain Score: 8  Pain Location: L residual limb Pain Descriptors / Indicators: Discomfort;Grimacing;Operative site guarding Pain Intervention(s): Limited activity within patient's tolerance;Monitored during session    Home Living                      Prior Function            PT Goals (current goals can now be found in the care plan section) Acute Rehab PT Goals Patient  Stated Goal: be able to walk again PT Goal Formulation: With patient Time For Goal Achievement: 04/18/21 Potential to Achieve Goals: Good Progress towards PT goals: Progressing toward goals (very slowly)    Frequency    Min 2X/week      PT Plan Current plan remains appropriate    Co-evaluation              AM-PAC PT "6 Clicks" Mobility   Outcome Measure  Help needed turning from your back to your side while in a flat bed without using bedrails?: A Little Help needed moving from lying on your back to sitting on the side of a flat bed without using bedrails?: A Lot Help needed moving to and from a bed to a chair (including a wheelchair)?: Total Help needed standing up from a chair using your arms (e.g., wheelchair or bedside chair)?: Total Help needed to walk in hospital room?: Total Help needed climbing 3-5 steps with a railing? : Total 6 Click Score: 9    End of Session   Activity Tolerance: Patient limited by pain Patient left: in bed;with call bell/phone within reach;with bed alarm set Nurse Communication: Mobility status PT Visit Diagnosis: Unsteadiness on feet (R26.81);Muscle weakness (generalized) (M62.81);Other abnormalities of gait and mobility (R26.89);Difficulty in walking, not elsewhere classified (R26.2);Pain Pain - Right/Left: Left Pain - part of body: Leg     Time: 7989-2119 PT Time Calculation (min) (ACUTE ONLY): 14 min  Charges:  $Therapeutic Activity: 8-22 mins                     Windell Norfolk, DPT, PN1   Supplemental Physical Therapist [place]    Pager [phone] Acute Rehab Office [phone]

## 2021-04-09 DIAGNOSIS — D649 Anemia, unspecified: Secondary | ICD-10-CM

## 2021-04-09 DIAGNOSIS — A419 Sepsis, unspecified organism: Secondary | ICD-10-CM

## 2021-04-09 DIAGNOSIS — D638 Anemia in other chronic diseases classified elsewhere: Secondary | ICD-10-CM

## 2021-04-09 LAB — CBC
HCT: 25.7 % — ABNORMAL LOW (ref 39.0–52.0)
Hemoglobin: 8.3 g/dL — ABNORMAL LOW (ref 13.0–17.0)
MCH: 28.8 pg (ref 26.0–34.0)
MCHC: 32.3 g/dL (ref 30.0–36.0)
MCV: 89.2 fL (ref 80.0–100.0)
Platelets: 174 10*3/uL (ref 150–400)
RBC: 2.88 MIL/uL — ABNORMAL LOW (ref 4.22–5.81)
RDW: 16.2 % — ABNORMAL HIGH (ref 11.5–15.5)
WBC: 10.9 10*3/uL — ABNORMAL HIGH (ref 4.0–10.5)
nRBC: 0 % (ref 0.0–0.2)

## 2021-04-09 LAB — BASIC METABOLIC PANEL
Anion gap: 11 (ref 5–15)
BUN: 50 mg/dL — ABNORMAL HIGH (ref 6–20)
CO2: 25 mmol/L (ref 22–32)
Calcium: 8.6 mg/dL — ABNORMAL LOW (ref 8.9–10.3)
Chloride: 92 mmol/L — ABNORMAL LOW (ref 98–111)
Creatinine, Ser: 3.04 mg/dL — ABNORMAL HIGH (ref 0.61–1.24)
GFR, Estimated: 23 mL/min — ABNORMAL LOW (ref >60)
Glucose, Bld: 331 mg/dL — ABNORMAL HIGH (ref 70–99)
Potassium: 3.9 mmol/L (ref 3.5–5.1)
Sodium: 128 mmol/L — ABNORMAL LOW (ref 135–145)

## 2021-04-09 LAB — GLUCOSE, CAPILLARY
Glucose-Capillary: 286 mg/dL — ABNORMAL HIGH (ref 70–99)
Glucose-Capillary: 339 mg/dL — ABNORMAL HIGH (ref 70–99)

## 2021-04-09 MED ORDER — DARBEPOETIN ALFA 150 MCG/0.3ML IJ SOSY
PREFILLED_SYRINGE | INTRAMUSCULAR | Status: AC
  Filled 2021-04-09: qty 0.3

## 2021-04-09 MED ORDER — OXYCODONE HCL 5 MG PO TABS
ORAL_TABLET | ORAL | Status: AC
  Filled 2021-04-09: qty 1

## 2021-04-09 MED ORDER — VANCOMYCIN HCL IN DEXTROSE 1-5 GM/200ML-% IV SOLN
INTRAVENOUS | Status: AC
  Filled 2021-04-09: qty 200

## 2021-04-09 MED ORDER — AMLODIPINE BESYLATE 5 MG PO TABS
5.0000 mg | ORAL_TABLET | Freq: Every day | ORAL | Status: DC
  Administered 2021-04-09 – 2021-04-13 (×5): 5 mg via ORAL
  Filled 2021-04-09 (×5): qty 1

## 2021-04-09 MED ORDER — HYDROMORPHONE HCL 1 MG/ML IJ SOLN
INTRAMUSCULAR | Status: AC
  Filled 2021-04-09: qty 0.5

## 2021-04-09 NOTE — TOC Progression Note (Signed)
Transition of Care [facility]) - Progression Note    Patient Details  Name: Jeffrey [name] MRN: [id number] Date of Birth: 1961/04/12  Transition of Care [facility] CM/SW [place], LCSW Phone Number: 04/09/2021, 4:11 PM  Clinical Narrative:    CSW received call from [facility]; they not have have beds available and will not for at least two weeks. CSW received call from [facility] and they are able to accept patient.  Patient's options are [place] or [facility] (which patient indicated he did not want to go to [place]). Will make patient aware. Patient will need to make a decision so that his dialysis center can be switched to [facility]. Per [place], his current insurance approval will be effective until Monday pending a facility choice. Then it will need to be re-submitted.     Expected Discharge Plan: [place] Barriers to Discharge: Insurance Authorization,Continued Medical Work up  Expected Discharge Plan and Services Expected Discharge Plan: [place] In-house Referral: Clinical Social Work   Post Acute Care Choice: [place] Living arrangements for the past 2 months: [place] Determinants of Health (SDOH) Interventions    Readmission Risk Interventions Readmission Risk Prevention Plan 04/07/2021  Transportation Screening Complete  Medication Review Press photographer) Referral to Pharmacy  PCP or Specialist appointment within 3-5 days of discharge Complete  HRI or Home Care Consult Complete  SW Recovery Care/Counseling Consult Complete  Palliative Care Screening Not [place] Complete

## 2021-04-09 NOTE — Progress Notes (Signed)
     Jeffrey [name] is a 60 y.o. male   Orthopaedic diagnosis: Status post left above-the-knee amputation  Subjective: Patient is resting comfortably.  He is back from hemodialysis today.  Pain is controlled.  States the available SNF beds are not up to his standards.  He is holding out for a specific facility.  Objectyive: Vitals:   04/09/21 1258 04/09/21 1350  BP: (!) 134/51 (!) 113/55  Pulse: 85 88  Resp: 16 18  Temp: 99.3 F (37.4 C) 98.3 F (36.8 C)  SpO2: 99% 100%     Exam: Awake and alert Respirations even and unlabored No acute distress  Left AKA stump after VAC removed demonstrates well-healing surgical incision.  No significant bleeding or drainage.  No sign of infection.  Sutures in place without failure.  Assessment: Status post left above-the-knee amputation   Plan: The VAC was removed today.  His wound looks to be healing well.  We will place a soft dressing.  It may be difficult to keep the dressing in place but I encouraged the patient to do his best.  I did encourage him to accept the SNF bed offers as they can be hard to come by.  He will consider this.  We will leave the sutures in at least 4 weeks before removing them.  If he remains in the hospital at that time I will remove them.  If not, he will return to clinic to have them removed and for wound check.  Okay for discharge from orthopedic standpoint.   Radene Journey, MD

## 2021-04-09 NOTE — Progress Notes (Signed)
Presented to bedside to remove the wound VAC.  Patient was at hemodialysis. We will try back later to remove the wound VAC and likely place a soft dressing.

## 2021-04-09 NOTE — Progress Notes (Signed)
PROGRESS NOTE    Jeffrey [name]   [id number]  DOB: Feb 19, 1961  DOA: 03/28/2021 PCP: Merryl Hacker, No   Brief Narrative:  Jeffrey [name] 60 year old with past medical history significant for ESRD,  diabetes presents to the ED complaining of fever.  Accompanied by vomiting, diaphoresis and mild confusion.  He has been having left knee pain.  Patient has a history of chronic knee  pain and swelling left knee, prior imagine at [facility].  Patient has a history of knee replacement/hardware bilaterally.   In the ED left knee imaging showed massive knee joint effusion, Temp 103 degrees, WBC 12K. Patient was started empirically on IV antibiotics.  Patient was evaluated by orthopedic surgery, who is recommending AKA.   He was also diagnosed with MRSA bacteremia & underwent hemodialysis catheter removal.  Currently has RUE AVG (01/2021).  ID and orthopedic consulted. Underwent Left AKA by Dr Lucia Gaskins  on 5/7. Patient decline TEE. ID recommends IV Vancomycin through June 18 with HD.   Likely Acute blood loss anemia, post surgery. Patient refused labs 04/04/2021. Labs  On 5/09 showed Hb 4.5.     Subjective: No new complaints.     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- recommendations were to perform an AKA vs transfer to a tertiary care facility- patient decided on AKA - left AKA 5/7 - cont wound vac x 5 days (today is last day)- will wait for Dr Lucia Gaskins to remove tomorrow - bloody drainage noted in vac - Vac removed today by Dr Lucia Gaskins- remove sutures in 4 wks  Hyponatremia - follow with dialysis  Anemia - drop noted from 8.8 on 5/7 to 4.5 on 5/9- no blood draw on 5/8  - due to ESRD, acute infection and possibly acute blood loss from AKA - 3 U PRBC  required for HB of 4.5 which has improved to 7.4 by 5/11 - 5/12> unable to get blood today and he did not allow further blood draws - 5/13 Hb 8.3  DM 2 - increased AM insulin on 5/11 as sugars high in PM-   - sugars quite erratic ranging from 85 to 339- follow for now without change  HTN - cont Coreg - Amlodipine on hold- will resume     ESRD (end stage renal disease) -cont dialysis per nephrology - HD cath removed  Hypersomnolence - resolved after holding Gabapentin, Nortriptyline, Trazodone, Valium   Morbid obesity   Body mass index is 40.54 kg/m.    Time spent in minutes: 35 DVT prophylaxis: SCD's Start: 04/04/21 0402 SCDs Start: 03/29/21 0842 Code Status: Full code Family Communication:  Level of Care: Level of care: Progressive Disposition Plan:  Status is: Inpatient  Remains inpatient appropriate because:Inpatient level of care appropriate due to severity of illness   Dispo: The patient is from: Home              Anticipated d/c is to: SNF              Patient currently is not medically stable to d/c.   Difficult to place patient No      Consultants:   ID  Nephrology  Orthopedic surgery Procedures:   Left AKA Antimicrobials:  Anti-infectives (From admission,  onward)   Start     Dose/Rate Route Frequency Ordered Stop   04/09/21 1204  vancomycin (VANCOCIN) 1-5 GM/200ML-% IVPB       Note to Pharmacy: Wallace Cullens   : cabinet override      04/09/21 1204 04/09/21 1635   04/09/21 1200  vancomycin (VANCOCIN) IVPB 1000 mg/200 mL premix        1,000 mg 200 mL/hr over 60 Minutes Intravenous Every M-W-F (Hemodialysis) 04/07/21 1140     04/07/21 1145  vancomycin (VANCOREADY) IVPB 1500 mg/300 mL        1,500 mg 150 mL/hr over 120 Minutes Intravenous To Hemodialysis 04/07/21 1140 04/07/21 1428   04/03/21 1130  ceFAZolin (ANCEF) IVPB 3g/100 mL premix  Status:  Discontinued        3 g 200 mL/hr over 30 Minutes Intravenous On call to O.R. 04/03/21 1008  04/03/21 1459   04/03/21 1010  ceFAZolin (ANCEF) 3-0.9 GM/100ML-% IVPB       Note to Pharmacy: Baird Lyons  : cabinet override      04/03/21 1010 04/03/21 2214   04/03/21 0915  vancomycin (VANCOREADY) IVPB 750 mg/150 mL        750 mg 150 mL/hr over 60 Minutes Intravenous  Once 04/03/21 0829 04/03/21 1622   03/29/21 2000  ceFEPIme (MAXIPIME) 2 g in sodium chloride 0.9 % 100 mL IVPB  Status:  Discontinued        2 g 200 mL/hr over 30 Minutes Intravenous Every M-W-F (2000) 03/29/21 0321 03/29/21 1541   03/29/21 1200  vancomycin (VANCOCIN) IVPB 1000 mg/200 mL premix  Status:  Discontinued        1,000 mg 200 mL/hr over 60 Minutes Intravenous Every M-W-F (Hemodialysis) 03/29/21 0321 04/07/21 1140   03/29/21 0000  vancomycin (VANCOCIN) 2,500 mg in sodium chloride 0.9 % 500 mL IVPB        2,500 mg 250 mL/hr over 120 Minutes Intravenous  Once 03/28/21 2346 03/29/21 0340   03/29/21 0000  ceFEPIme (MAXIPIME) 2 g in sodium chloride 0.9 % 100 mL IVPB        2 g 200 mL/hr over 30 Minutes Intravenous  Once 03/28/21 2346 03/29/21 0126       Objective: Vitals:   04/09/21 1200 04/09/21 1258 04/09/21 1350 04/09/21 1636  BP: 130/70 (!) 134/51 (!) 113/55 (!) 135/55  Pulse: 92 85 88 89  Resp:  '16 18 19  '$ Temp:  99.3 F (37.4 C) 98.3 F (36.8 C) 99.7 F (37.6 C)  TempSrc:  Oral Oral Oral  SpO2:  99% 100% 100%  Weight:  110.5 kg 110.5 kg   Height:        Intake/Output Summary (Last 24 hours) at 04/09/2021 1650 Last data filed at 04/09/2021 1401 Gross per 24 hour  Intake 480 ml  Output 4100 ml  Net -3620 ml   Filed Weights   04/09/21 0828 04/09/21 1258 04/09/21 1350  Weight: 114.1 kg 110.5 kg 110.5 kg    Examination: General exam: Appears comfortable  HEENT: PERRLA, oral mucosa moist, no sclera icterus or thrush Respiratory system: Clear to auscultation. Respiratory effort normal. Cardiovascular system: S1 & S2 heard, regular rate and rhythm Gastrointestinal system: Abdomen soft,  non-tender, nondistended. Normal bowel sounds   Central nervous system: Alert and oriented. No focal neurological deficits. Extremities: No cyanosis, clubbing or edema- left AKA Skin: No rashes or ulcers Psychiatry:  Mood & affect appropriate.     Data Reviewed: I have personally reviewed following labs and  imaging studies  CBC: Recent Labs  Lab 04/05/21 1030 04/05/21 2217 04/06/21 1616 04/07/21 0032 04/09/21 0900  WBC 11.1* 11.1* 10.5 9.9 10.9*  NEUTROABS  --   --  8.0*  --   --   HGB 4.5* 5.4* 6.8* 7.4* 8.3*  HCT 14.0* 16.4* 20.9* 22.6* 25.7*  MCV 90.9 85.4 88.9 86.6 89.2  PLT 226 195 215 211 [id number]   Basic Metabolic Panel: Recent Labs  Lab 04/03/21 1226 04/05/21 0822 04/06/21 1616 04/07/21 0953 04/09/21 0900  NA 129* 129* 128* 128* 128*  K 4.9 3.7 4.2 4.5 3.9  CL 99 94* 94* 94* 92*  CO2  --  '23 25 22 25  '$ GLUCOSE 270* 156* 387* 281* 331*  BUN 72* 104* 53* 58* 50*  CREATININE 3.80* 4.39* 3.45* 3.30* 3.04*  CALCIUM  --  8.7* 8.3* 8.7* 8.6*  PHOS  --  6.6*  --  5.4*  --    GFR: Estimated Creatinine Clearance: 29.6 mL/min (A) (by C-G formula based on SCr of 3.04 mg/dL (H)). Liver Function Tests: Recent Labs  Lab 04/05/21 0822 04/07/21 0953  ALBUMIN 2.4* 2.6*   No results for input(s): LIPASE, AMYLASE in the last 168 hours. No results for input(s): AMMONIA in the last 168 hours. Coagulation Profile: No results for input(s): INR, PROTIME in the last 168 hours. Cardiac Enzymes: No results for input(s): CKTOTAL, CKMB, CKMBINDEX, TROPONINI in the last 168 hours. BNP (last 3 results) No results for input(s): PROBNP in the last 8760 hours. HbA1C: No results for input(s): HGBA1C in the last 72 hours. CBG: Recent Labs  Lab 04/08/21 1230 04/08/21 1648 04/08/21 2050 04/09/21 1344 04/09/21 1633  GLUCAP 167* 85 152* 286* 339*   Lipid Profile: No results for input(s): CHOL, HDL, LDLCALC, TRIG, CHOLHDL, LDLDIRECT in the last 72 hours. Thyroid Function Tests: No  results for input(s): TSH, T4TOTAL, FREET4, T3FREE, THYROIDAB in the last 72 hours. Anemia Panel: No results for input(s): VITAMINB12, FOLATE, FERRITIN, TIBC, IRON, RETICCTPCT in the last 72 hours. Urine analysis:    Component Value Date/Time   COLORURINE AMBER (A) 03/30/2021 0239   APPEARANCEUR HAZY (A) 03/30/2021 0239   LABSPEC 1.018 03/30/2021 0239   PHURINE 5.0 03/30/2021 0239   GLUCOSEU NEGATIVE 03/30/2021 0239   HGBUR NEGATIVE 03/30/2021 0239   BILIRUBINUR NEGATIVE 03/30/2021 0239   KETONESUR NEGATIVE 03/30/2021 0239   PROTEINUR NEGATIVE 03/30/2021 0239   NITRITE NEGATIVE 03/30/2021 0239   LEUKOCYTESUR NEGATIVE 03/30/2021 0239   Sepsis Labs: '@LABRCNTIP'$ (procalcitonin:4,lacticidven:4) ) Recent Results (from the past 240 hour(s))  Culture, blood (Routine X 2) w Reflex to ID Panel     Status: None   Collection Time: 03/31/21  7:00 AM   Specimen: BLOOD RIGHT ARM  Result Value Ref Range Status   Specimen Description BLOOD RIGHT ARM  Final   Special Requests   Final    BOTTLES DRAWN AEROBIC AND ANAEROBIC Blood Culture adequate volume   Culture   Final    NO GROWTH 5 DAYS Performed at [facility], [place] [address] [place], [place] [address]    Report Status 04/07/2021 FINAL  Final  Culture, blood (Routine X 2) w Reflex to ID Panel     Status: None   Collection Time: 03/31/21  7:00 AM   Specimen: BLOOD RIGHT ARM  Result Value Ref Range Status   Specimen Description BLOOD RIGHT ARM  Final   Special Requests   Final    BOTTLES DRAWN AEROBIC AND ANAEROBIC Blood Culture adequate volume  Culture   Final    NO GROWTH 5 DAYS Performed at [facility], [place] [address] [place], [place] [address]    Report Status 04/07/2021 FINAL  Final         Radiology Studies: No results found.    Scheduled Meds: . (feeding supplement) PROSource Plus  30 mL Oral BID BM  . sodium chloride   Intravenous Once  . sodium chloride   Intravenous Once  . sodium chloride    Intravenous Once  . amLODipine  5 mg Oral QHS  . atorvastatin  40 mg Oral QHS  . calcium acetate  1,334 mg Oral TID with meals  . carvedilol  12.5 mg Oral BID WC  . Chlorhexidine Gluconate Cloth  6 each Topical Q0600  . darbepoetin (ARANESP) injection - DIALYSIS  150 mcg Intravenous Q Fri-HD  . docusate sodium  100 mg Oral Daily  . insulin aspart  0-15 Units Subcutaneous TID WC  . insulin aspart  0-5 Units Subcutaneous QHS  . insulin regular human CONCENTRATED  110 Units Subcutaneous Q breakfast   And  . insulin regular human CONCENTRATED  45 Units Subcutaneous Q supper  . mouth rinse  15 mL Mouth Rinse BID  . nortriptyline  10 mg Oral QHS  . pantoprazole  40 mg Oral Daily  . polyethylene glycol  17 g Oral BID  . senna  1 tablet Oral Daily  . sertraline  100 mg Oral Daily   Continuous Infusions: . magnesium sulfate bolus IVPB    . vancomycin 1,000 mg (04/09/21 1205)     LOS: 11 days      Debbe Odea, MD [facility] Pager: [email or web address] 04/09/2021, 4:50 PM

## 2021-04-09 NOTE — Progress Notes (Signed)
Inpatient Diabetes Program Recommendations  AACE/ADA: New Consensus Statement on Inpatient Glycemic Control (2015)  Target Ranges:  Prepandial:   less than 140 mg/dL      Peak postprandial:   less than 180 mg/dL (1-2 hours)      Critically ill patients:  140 - 180 mg/dL   Lab Results  Component Value Date   GLUCAP 286 (H) 04/09/2021   HGBA1C 8.2 (H) 03/29/2021    Review of Glycemic Control  Diabetes history: DM2 Outpatient Diabetes medications: U-500 120 in am and 65 ac supper Current orders for Inpatient glycemic control: U-500 110 units in am and 45 units in pm, Novolog 0-15 units TID with meals and 0-5 HS  CBGs above goal: 331, 286, 339 mg/dL  Inpatient Diabetes Program Recommendations:     Increase U-500 QD at supper to 50 units  Continue to follow.  Thank you. Lorenda Peck, RD, LDN, CDE Inpatient Diabetes Coordinator [phone]

## 2021-04-09 NOTE — Progress Notes (Signed)
[facility] Progress Note   Subjective:     [name] seen and examined during HD treatment. Patient tolerated HD well. No complaints at this time. Denies pain, SOB, CP, ABD pain, N/V/D. S/P L AKA-ortho following. Ortho scheduled to remove wound vac today.   Objective Vitals:   04/09/21 1030 04/09/21 1100 04/09/21 1130 04/09/21 1200  BP: 137/62 (!) 167/62 (!) 114/57 130/70  Pulse: 83 87 88 92  Resp:      Temp:      TempSrc:      SpO2:      Weight:      Height:       Physical Exam General: Appears comfortable; No acute respiratory distress Heart: Normal S1 and S2; No murmurs, gallops, or friction rub Lungs: Clear anteriorly and laterally; No wheezing, rales, or rhonchi Abdomen: Large, soft, non-tender Extremities: L AKA-wound vac applied; No RLE edema Dialysis Access: R AVG   Filed Weights   04/07/21 0900 04/07/21 1306 04/09/21 0828  Weight: 114.6 kg 112 kg 114.1 kg    Intake/Output Summary (Last 24 hours) at 04/09/2021 1255 Last data filed at 04/08/2021 1958 Gross per 24 hour  Intake --  Output 125 ml  Net -125 ml    Additional Objective Labs: Basic Metabolic Panel: Recent Labs  Lab 04/05/21 0822 04/06/21 1616 04/07/21 0953 04/09/21 0900  NA 129* 128* 128* 128*  K 3.7 4.2 4.5 3.9  CL 94* 94* 94* 92*  CO2 '23 25 22 25  '$ GLUCOSE 156* 387* 281* 331*  BUN 104* 53* 58* 50*  CREATININE 4.39* 3.45* 3.30* 3.04*  CALCIUM 8.7* 8.3* 8.7* 8.6*  PHOS 6.6*  --  5.4*  --    Liver Function Tests: Recent Labs  Lab 04/05/21 0822 04/07/21 0953  ALBUMIN 2.4* 2.6*   No results for input(s): LIPASE, AMYLASE in the last 168 hours. CBC: Recent Labs  Lab 04/05/21 1030 04/05/21 2217 04/06/21 1616 04/07/21 0032 04/09/21 0900  WBC 11.1* 11.1* 10.5 9.9 10.9*  NEUTROABS  --   --  8.0*  --   --   HGB 4.5* 5.4* 6.8* 7.4* 8.3*  HCT 14.0* 16.4* 20.9* 22.6* 25.7*  MCV 90.9 85.4 88.9 86.6 89.2  PLT 226 195 215 211 174   Blood Culture    Component Value  Date/Time   SDES BLOOD RIGHT ARM 03/31/2021 0700   SDES BLOOD RIGHT ARM 03/31/2021 0700   SPECREQUEST  03/31/2021 0700    BOTTLES DRAWN AEROBIC AND ANAEROBIC Blood Culture adequate volume   SPECREQUEST  03/31/2021 0700    BOTTLES DRAWN AEROBIC AND ANAEROBIC Blood Culture adequate volume   CULT  03/31/2021 0700    NO GROWTH 5 DAYS Performed at [facility], [place] [address] [place], [place] [address]    CULT  03/31/2021 0700    NO GROWTH 5 DAYS Performed at [facility], [place] [address] [place], [place] [address]    REPTSTATUS 04/07/2021 FINAL 03/31/2021 0700   REPTSTATUS 04/07/2021 FINAL 03/31/2021 0700   CBG: Recent Labs  Lab 04/07/21 2043 04/08/21 0727 04/08/21 1230 04/08/21 1648 04/08/21 2050  GLUCAP 306* 288* 167* 85 152*   Iron Studies: No results for input(s): IRON, TIBC, TRANSFERRIN, FERRITIN in the last 72 hours. Lab Results  Component Value Date   INR 1.2 03/28/2021   Medications: . magnesium sulfate bolus IVPB    . vancomycin    . vancomycin 1,000 mg (04/09/21 1205)   . (feeding supplement) PROSource Plus  30 mL  Oral BID BM  . sodium chloride   Intravenous Once  . sodium chloride   Intravenous Once  . sodium chloride   Intravenous Once  . atorvastatin  40 mg Oral QHS  . calcium acetate  1,334 mg Oral TID with meals  . carvedilol  12.5 mg Oral BID WC  . Chlorhexidine Gluconate Cloth  6 each Topical Q0600  . darbepoetin (ARANESP) injection - DIALYSIS  150 mcg Intravenous Q Fri-HD  . docusate sodium  100 mg Oral Daily  . HYDROmorphone      . insulin aspart  0-15 Units Subcutaneous TID WC  . insulin aspart  0-5 Units Subcutaneous QHS  . insulin regular human CONCENTRATED  110 Units Subcutaneous Q breakfast   And  . insulin regular human CONCENTRATED  45 Units Subcutaneous Q supper  . mouth rinse  15 mL Mouth Rinse BID  . nortriptyline  10 mg Oral QHS  . oxyCODONE      . pantoprazole  40 mg Oral Daily  . polyethylene glycol  17 g Oral BID  .  senna  1 tablet Oral Daily  . sertraline  100 mg Oral Daily    Dialysis Orders: [facility] MWF  4h 121kg 3K/2.5 bath RUE AVG/ R IJ TDC Hep 4500+ 259md -Mircera 100 ug q2 last 4/27  Assessment/Plan: 1. MRSA bacteremia: BCx 5/1 +, repeat BCx 5/3 negative. On Vancomycin through 6/18 with HD.TDC removed 5/3, no need to replace as AVG in use. 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 2. ESRD:Continue HD per usual MWF schedule -receiving HD today. Hold heparin  3. HD access:[facility] Had fistulogram 4/14 that showed patent AVG.TDC removed per IR 5/3, AVG in use. 4. Anemiaof ESRD/ABLA : Likely post-op blood loss. Hgb 7.4 S/p 2 units prbcs 5/9, 2 units 5/10, and 1 unit 5/11. Hgb now 8.3. Trend HH as patient allows. ESA dose given today. Monitor trend.  5. HTN/ volume:BP stable,EDW will need to be lowered s/p AKA. 6. PTSD:Continue home meds. 7. COPD:Per primary. 8. DM2: Per primary, on insulin. 9. 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 Will monitor trend. No VDRA. 10. Nutrition: Alb low,continuesupplements. 11. Dispo: For SNF placement   CTobie Poet NP [place]5/13/2022,12:55 PM  LOS: 11 days

## 2021-04-09 NOTE — Progress Notes (Signed)
TOC CSW/N. Rayyan discussed case with Renal Navigator to see if transient request for HD in [place] could be started before the weekend. She states she has heard back from all facilities and that patient has two options in [facility] for SNF rehab: [facility], [address], [place], [place] [address] and [facility], [place], [place], [place] [address]. These facilities would require HD at different facilities and Navigator asked permission to speak with patient while in HD to see if he has a preference for SNF/HD clinic so Navigator can start making arrangements. CSW agreed.  Navigator met with patient at HD bedside to introduce self and explain role. Patient was agreeable to speaking with Navigator and understood reason for visit. When told that he only have two options for SNF, he states that he has been to both of these facilities before and that he will not return to either of them, though he does note that the food was good at [facility] He says they do not have enough staff and the beds are hard at [facility]. He wants to go to [facility]. Navigator acknowledged this, and told him that per [facility] CSW on his unit, this facility will not have a bed available for at least two weeks. He states that his surgeon has not said he is ready to go yet, and no one should be making any plans for him until he is "healed" and ready for discharge. He also states that he does not feel ready to leave the hospital and will continue to say so. Navigator stated understanding to his sentiment, however, told patient that we needs to be in the planning process so that we have preparations in place for when he is cleared for discharge. He disagrees and states that he hopes the SNF he really wants to go to will be ready to accept him when he is ready to leave. Navigator, though admittedly not his MD, suggests that he will most likely be ready for discharge before 2 weeks, which is  why [facility] is not given to him as an option. He does not accept, but has questions about CIR, stating that he would consider this, but was told that he would not be able to go to CIR and then also have [facility] following. Navigator has no knowledge of insurance rules, but will attempt to get more information to him. Navigator did state that in order to be considered for CIR, he would need to actively participate in therapy while hospitalized and have 24 hour support at home following. He said these things would be no problem. He said that he needs to speak with his wife and his doctor and that talking about all of this just makes him sad. He does appear as if he might cry. Navigator left his bedside and provided update to [facility] CSW. Navigator will continue to follow, but is unable to request a change in HD clinic at this point.   Alphonzo Cruise, [place] Renal Navigator [phone]

## 2021-04-10 DIAGNOSIS — R7881 Bacteremia: Secondary | ICD-10-CM | POA: Diagnosis not present

## 2021-04-10 DIAGNOSIS — T8454XD Infection and inflammatory reaction due to internal left knee prosthesis, subsequent encounter: Secondary | ICD-10-CM | POA: Diagnosis not present

## 2021-04-10 DIAGNOSIS — Z95828 Presence of other vascular implants and grafts: Secondary | ICD-10-CM

## 2021-04-10 DIAGNOSIS — N186 End stage renal disease: Secondary | ICD-10-CM | POA: Diagnosis not present

## 2021-04-10 DIAGNOSIS — E1159 Type 2 diabetes mellitus with other circulatory complications: Secondary | ICD-10-CM | POA: Diagnosis not present

## 2021-04-10 LAB — GLUCOSE, CAPILLARY
Glucose-Capillary: 261 mg/dL — ABNORMAL HIGH (ref 70–99)
Glucose-Capillary: 310 mg/dL — ABNORMAL HIGH (ref 70–99)
Glucose-Capillary: 162 mg/dL — ABNORMAL HIGH (ref 70–99)
Glucose-Capillary: 168 mg/dL — ABNORMAL HIGH (ref 70–99)

## 2021-04-10 MED ORDER — PROSOURCE PLUS PO LIQD: 30.0000 mL | Freq: Two times a day (BID) | ORAL | Status: DC

## 2021-04-10 MED ORDER — INSULIN REGULAR HUMAN (CONC) 500 UNIT/ML ~~LOC~~ SOPN: 120.0000 [IU] | PEN_INJECTOR | Freq: Every day | SUBCUTANEOUS | Status: DC

## 2021-04-10 MED ORDER — NORTRIPTYLINE HCL 10 MG PO CAPS
20.0000 mg | ORAL_CAPSULE | Freq: Every day | ORAL | Status: DC
  Administered 2021-04-10 – 2021-04-13 (×4): 20 mg via ORAL
  Filled 2021-04-10 (×5): qty 2

## 2021-04-10 MED ORDER — VANCOMYCIN HCL IN DEXTROSE 1-5 GM/200ML-% IV SOLN: 1000.0000 mg | INTRAVENOUS | Status: DC

## 2021-04-10 MED ORDER — INSULIN REGULAR HUMAN (CONC) 500 UNIT/ML ~~LOC~~ SOPN
120.0000 [IU] | PEN_INJECTOR | Freq: Every day | SUBCUTANEOUS | Status: DC
  Administered 2021-04-11 – 2021-04-12 (×2): 120 [IU] via SUBCUTANEOUS

## 2021-04-10 MED ORDER — INSULIN REGULAR HUMAN (CONC) 500 UNIT/ML ~~LOC~~ SOPN
55.0000 [IU] | PEN_INJECTOR | Freq: Every day | SUBCUTANEOUS | Status: DC
  Administered 2021-04-10 – 2021-04-11 (×2): 55 [IU] via SUBCUTANEOUS

## 2021-04-10 MED ORDER — INSULIN REGULAR HUMAN (CONC) 500 UNIT/ML ~~LOC~~ SOPN: 45.0000 [IU] | PEN_INJECTOR | Freq: Every day | SUBCUTANEOUS | Status: DC

## 2021-04-10 MED ORDER — CALCIUM ACETATE (PHOS BINDER) 667 MG PO CAPS: 1334.0000 mg | ORAL_CAPSULE | Freq: Three times a day (TID) | ORAL | Status: DC

## 2021-04-10 MED ORDER — GABAPENTIN 300 MG PO CAPS
300.0000 mg | ORAL_CAPSULE | Freq: Every day | ORAL | Status: DC
  Administered 2021-04-10: 300 mg via ORAL
  Filled 2021-04-10: qty 1

## 2021-04-10 NOTE — Discharge Summary (Addendum)
Physician Discharge Summary  Jeffrey [name] [id number] DOB: [birth year] DOA: 03/28/2021  PCP: Pcp, No  Admit date: 03/28/2021 Discharge date: 04/10/2021  Admitted From: home  Disposition:  SNF   Recommendations for Outpatient Follow-up:  1. F/u on diabetes control  Home Health:  none  Discharge Condition:  stable   CODE STATUS:  Full code   Diet recommendation:  Renal diet, carb modified, heart healthy Consultations:  ID  Nephrology  Orthopedic surgery Procedures/Studies: . 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  In the ED: left knee imaging showed massive knee joint effusion,Temp 103 degrees, WBC 12K.Patient was started empirically on IV antibiotics. He was evaluated by orthopedic surgery, who recommended a L AKA.  He was later found to have MRSA bacteremia & underwent hemodialysis catheter removal. Currently has RUE AVG (01/2021). Underwent Left AKA by Dr Lucia Gaskins on 5/7.     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 - recommendations were to perform an AKA vs transfer to a tertiary care facility - patient decided on AKA and underwent left AKA 5/7 with plan to cont wound vac x 5 days    - Vac removed 5/13 by Dr Lucia Gaskins - he will need to follow up to remove sutures in 4 wks - due to phantom limb pain, will resume home Neurontin and increase his Nortriptyline to home dose of 20 mg QHS  Hyponatremia - following with dialysis  Anemia - drop noted from 8.8 on 5/7 to 4.5 on 5/9- he declined blood draw on 5/8  - due to ESRD, acute infection and possibly acute blood loss from AKA on 5/7 - 3 U PRBC given for HB of 4.5 which improved to 7.4 by 5/11 - 5/12> unable to get blood today and he did not allow further blood draws - 5/13 Hb 8.3  DM 2 A1c 8.2 on 03/29/21 - cont home insulin doses  HTN - cont Coreg - Amlodipine on hold- will resume     ESRD (end stage renal disease) -cont dialysis per nephrology - HD cath removed  Hypersomnolence - resolved after holding Narcotics, Gabapentin, Nortriptyline, Trazodone, Valium   Morbid obesity   Body mass index is 40.54 kg/m.    Discharge Exam: Vitals:   04/10/21 0811 04/10/21 1139  BP: (!) 120/56 (!) 120/52  Pulse: 81 79  Resp: 20 16  Temp: 99.2 F (37.3 C) 99 F (37.2 C)  SpO2: 98% 96%   Vitals:  04/09/21 1350 04/09/21 1636 04/10/21 0811 04/10/21 1139  BP: (!) 113/55 (!) 135/55 (!) 120/56 (!) 120/52  Pulse: 88 89 81 79  Resp: '18 19 20 16  '$ Temp: 98.3 F (36.8 C) 99.7 F (37.6 C) 99.2 F (37.3 C) 99 F (37.2 C)  TempSrc: Oral Oral Oral Oral  SpO2: 100% 100% 98% 96%  Weight: 110.5 kg     Height:        General: Pt is alert, awake, not in acute distress Cardiovascular: RRR, S1/S2 +, no rubs, no gallops Respiratory: CTA bilaterally, no wheezing, no rhonchi Abdominal: Soft, NT, ND, bowel sounds + Extremities: no edema, no cyanosis- left AKA- dressing on  stump not opened   Discharge Instructions  Discharge Instructions    Increase activity slowly   Complete by: As directed    No wound care   Complete by: As directed      Allergies as of 04/10/2021      Reactions   Bupropion Swelling   Dexmedetomidine Nausea And Vomiting   Other reaction(s): Other (See Comments) Dose-limiting bradycardia Dose-limiting bradycardia   Ibuprofen Shortness Of Breath, Swelling   Pt tolerates aspirin Pt tolerates aspirin   Pioglitazone Anaphylaxis, Rash   Tomato Anaphylaxis      Medication List    STOP taking these medications   albuterol 108 (90 Base) MCG/ACT inhaler Commonly known as: VENTOLIN HFA   clotrimazole 1 % cream Commonly known as: LOTRIMIN   diazepam 5 MG tablet Commonly known as: VALIUM   metolazone 10 MG tablet Commonly known as: ZAROXOLYN   Nasal Spray 0.05 % Soln   torsemide 20 MG tablet Commonly known as: DEMADEX   traZODone 100 MG tablet Commonly known as: DESYREL     TAKE these medications   (feeding supplement) PROSource Plus liquid Take 30 mLs by mouth 2 (two) times daily between meals. Start taking on: Apr 11, 2021   amLODipine 5 MG tablet Commonly known as: NORVASC Take 5 mg by mouth at bedtime.   Aspirin Low Dose 81 MG EC tablet Generic drug: aspirin Take 81 mg by mouth daily.   atorvastatin 40 MG tablet Commonly known as: LIPITOR Take 40 mg by mouth daily.   calcium acetate 667 MG capsule Commonly known as: PHOSLO Take 2 capsules (1,334 mg total) by mouth with breakfast, with lunch, and with evening meal. What changed: how much to take   carvedilol 12.5 MG tablet Commonly known as: COREG Take 12.5 mg by mouth 2 (two) times daily with a meal.   Cholecalciferol 50 MCG (2000 UT) Caps Take 2,000 Units by mouth daily.   Dexcom G6 Receiver Devi Use 1 Device continuously   Dexcom G6 Sensor Misc Use 1 each every 10 (ten) days   Dexcom G6 Transmitter Misc Use 1 each every 3 (three) months    diphenhydrAMINE 25 MG tablet Commonly known as: BENADRYL Take 50 mg by mouth every 6 (six) hours as needed for allergies.   gabapentin 300 MG capsule Commonly known as: NEURONTIN Take 300 mg by mouth at bedtime.   HumuLIN R U-500 KwikPen 500 UNIT/ML kwikpen Generic drug: insulin regular human CONCENTRATED Inject 65-120 Units into the skin See admin instructions. 120 units before breakfast and 65 units before dinner   nortriptyline 10 MG capsule Commonly known as: PAMELOR Take 20 mg by mouth at bedtime.   pantoprazole 20 MG tablet Commonly known as: PROTONIX Take 20 mg by mouth daily.   polyethylene glycol powder 17 GM/SCOOP powder Commonly known as:  GLYCOLAX/MIRALAX Take 17 g by mouth daily as needed for mild constipation.   Precision QID Test test strip Generic drug: glucose blood 4 times daily accucheck guide meter   Accu-Chek Guide test strip Generic drug: glucose blood 4 (four) times daily.   Procysbi 300 MG Pack Generic drug: Cysteamine Bitartrate accucheck guide meter   senna-docusate 8.6-50 MG tablet Commonly known as: Senokot-S Take 2 tablets by mouth in the morning and at bedtime.   sertraline 100 MG tablet Commonly known as: ZOLOFT Take 100 mg by mouth daily.   vancomycin 1-5 GM/200ML-% Soln Commonly known as: VANCOCIN Inject 200 mLs (1,000 mg total) into the vein every Monday, Wednesday, and Friday with hemodialysis. Start taking on: Apr 12, 2021       Allergies  Allergen Reactions  . Bupropion Swelling  . Dexmedetomidine Nausea And Vomiting    Other reaction(s): Other (See Comments) Dose-limiting bradycardia Dose-limiting bradycardia   . Ibuprofen Shortness Of Breath and Swelling    Pt tolerates aspirin Pt tolerates aspirin   . Pioglitazone Anaphylaxis and Rash  . Tomato Anaphylaxis      CT KNEE LEFT WO CONTRAST  Result Date: 03/29/2021 CLINICAL DATA:  Fever with generalized pain and possible sepsis. History of total knee  arthroplasty, date unknown. Clinical concern for joint infection. EXAM: CT OF THE LEFT KNEE WITHOUT CONTRAST TECHNIQUE: Multidetector CT imaging of the left knee was performed according to the standard protocol. Multiplanar CT image reconstructions were also generated. COMPARISON:  Radiographs 03/28/2021 FINDINGS: Bones/Joint/Cartilage Status post left total knee arthroplasty. There is prominent irregular lucency surrounding the tibial prosthesis which could reflect hardware loosening or infection. No loosening of the femoral hardware identified. The patella is located without gross bone destruction allowing for artifact from the total knee arthroplasty. There is a moderate-sized knee joint effusion with associated capsular calcifications. No intra-articular air identified. Ligaments Suboptimally assessed by CT.  Status post total knee arthroplasty. Muscles and Tendons The extensor mechanism appears intact. The distal quadriceps tendon appears attenuated. No focal muscular abnormalities are identified. Soft tissues No periarticular fluid collections, unexpected foreign bodies or soft tissue emphysema identified. Aside from the nonspecific joint effusion, no significant periarticular inflammatory changes are seen. IMPRESSION: 1. As demonstrated on preceding radiographs, there is irregular lucency around the tibial prosthesis which may reflect hardware loosening or infection. 2. No loosening of the femoral hardware identified. 3. Nonspecific moderate-sized knee joint effusion. Arthrocentesis recommended if persistent concern of infection. Electronically Signed   By: Richardean Sale M.D.   On: 03/29/2021 08:19   MR BRAIN WO CONTRAST  Result Date: 04/02/2021 CLINICAL DATA:  Mental status changes. The examination had to be discontinued prior to completion due to patient refusal for further imaging. EXAM: MRI HEAD WITHOUT CONTRAST TECHNIQUE: Multiplanar, multiecho pulse sequences of the brain and surrounding structures  were obtained without intravenous contrast. COMPARISON:  None. FINDINGS: Brain: No acute infarct, hemorrhage, or mass lesion is present. Minimal atrophy within normal limits for age. The ventricles are of normal size. No significant extraaxial fluid collection is present. The internal auditory canals are within normal limits. The brainstem and cerebellum are within normal limits. Vascular: T2 weighted imaging was not performed. No definite vascular abnormality seen. Skull and upper cervical spine: The craniocervical junction is normal. Upper cervical spine is within normal limits. Marrow signal is unremarkable. Sinuses/Orbits: Mild mucosal thickening present in the inferior frontal sinuses and anterior ethmoid air cells bilaterally. The globes and orbits are within normal limits. IMPRESSION: Normal MRI appearance of  the brain for age. No acute or focal lesion to explain the patient's symptoms. Minimal sinus disease. Electronically Signed   By: San Morelle M.D.   On: 04/02/2021 19:03   IR Removal Tun Cv Cath W/O FL  Result Date: 03/30/2021 INDICATION: Bacteremia.  Request for tunneled hemodialysis catheter removal. EXAM: REMOVAL OF TUNNELED HEMODIALYSIS CATHETER MEDICATIONS: 10 mL 1% lidocaine COMPLICATIONS: None immediate. PROCEDURE: Informed written consent was obtained from the patient following an explanation of the procedure, risks, benefits and alternatives to treatment. A time out was performed prior to the initiation of the procedure. Maximal barrier sterile technique was utilized including caps, mask, sterile gowns, sterile gloves, large sterile drape, and hand hygiene. ChloraPrep was used to prep the patient's right neck, chest and existing catheter. 1% lidocaine with epinephrine was injected around the catheter and the subcutaneous tunnel. The catheter was dissected out using scissors and curved hemostats until the cuff was freed from the surrounding fibrous sheath. Partial cuff retained,  catheter was removed intact otherwise. Hemostasis was obtained with manual compression. A dressing was placed. The patient tolerated the procedure well without immediate post procedural complication. IMPRESSION: Successful removal of tunneled dialysis catheter. Read by: Durenda Guthrie, PA-C Electronically Signed   By: Jacqulynn Cadet M.D.   On: 03/30/2021 11:06   DG Chest Port 1 View  Result Date: 03/28/2021 CLINICAL DATA:  Possible sepsis, fever, generalized pain, possible dialysis catheter infection EXAM: PORTABLE CHEST 1 VIEW COMPARISON:  03/08/2021 FINDINGS: Low lung volumes. Increasing opacities in the mid to lower lungs, could reflect atelectasis though developing edema or atypical infection could have a similar appearance in the appropriate clinical setting. Cardiomegaly with pulmonary vascular congestion. Dual lumen dialysis catheter tip terminates at the right atrium. Telemetry leads overlie the chest. No pneumothorax or visible effusion. No acute osseous or soft tissue abnormality. IMPRESSION: Low volumes with basilar opacities which may reflect atelectasis though edema or infection could have a similar appearance in the appropriate clinical setting. Right IJ approach dialysis catheter tip terminates at the right atrium. Electronically Signed   By: Lovena Le M.D.   On: 03/28/2021 22:51   DG Knee Complete 4 Views Left  Result Date: 03/28/2021 CLINICAL DATA:  Possible sepsis, fever and generalized pain EXAM: LEFT KNEE - COMPLETE 4+ VIEW COMPARISON:  None. FINDINGS: Extensive circumferential swelling with large joint effusion. Postsurgical changes from prior tricompartmental left knee arthroplasty with some questionable destructive changes about the articular margins as well as periprosthetic lucency surrounding the tibial component and cemented tibial stem. Additional heterotopic ossification is seen as well. No soft tissue gas. IMPRESSION: Prior tricompartmental left knee arthroplasty with large  effusion, extensive soft tissue swelling and some questionable destructive changes and hardware loosening concerning for septic arthritis and hardware infection. Electronically Signed   By: Lovena Le M.D.   On: 03/28/2021 22:56   ECHOCARDIOGRAM COMPLETE  Result Date: 04/01/2021    ECHOCARDIOGRAM REPORT   Patient Name:   EGE MAVITY Date of Exam: 04/01/2021 Medical Rec #:  [id number]   Height:       65.0 in Accession #:    [id number]  Weight:       271.2 lb Date of Birth:  [birth year]   BSA:          2.251 m Patient Age:    87 years    BP:           118/63 mmHg Patient Gender: M           HR:  87 bpm. Exam Location:  Inpatient Procedure: 2D Echo, Cardiac Doppler and Color Doppler Indications:    Bacteremia  History:        Patient has no prior history of Echocardiogram examinations.                 CHF, COPD; Risk Factors:Hypertension and Diabetes.  Sonographer:    Cammy Brochure Referring Phys: [id number] [place]  1. Left ventricular ejection fraction, by estimation, is 55 to 60%. The left ventricle has normal function. The left ventricle has no regional wall motion abnormalities. There is mild concentric left ventricular hypertrophy. Left ventricular diastolic parameters are consistent with Grade II diastolic dysfunction (pseudonormalization).  2. The aortic valve is calcified out or proportion or age but within normal limits in the sub-group of ESRD. Aortic valve regurgitation is not visualized. Aortic valve sclerosis/calcification is present, without any evidence of aortic stenosis.  3. Right ventricular systolic function is normal. The right ventricular size is normal.  4. Left atrial size was mildly dilated.  5. The mitral valve is abnormal. No evidence of mitral valve regurgitation. The mean mitral valve gradient is 3.0 mmHg with average heart rate of 82 bpm.  6. The inferior vena cava is normal in size with greater than 50% respiratory variability, suggesting right atrial  pressure of 3 mmHg. Comparison(s): No prior Echocardiogram. FINDINGS  Left Ventricle: Left ventricular ejection fraction, by estimation, is 55 to 60%. The left ventricle has normal function. The left ventricle has no regional wall motion abnormalities. The left ventricular internal cavity size was normal in size. There is  mild concentric left ventricular hypertrophy. Left ventricular diastolic parameters are consistent with Grade II diastolic dysfunction (pseudonormalization). Right Ventricle: The right ventricular size is normal. No increase in right ventricular wall thickness. Right ventricular systolic function is normal. Left Atrium: Left atrial size was mildly dilated. Right Atrium: Right atrial size was normal in size. Pericardium: There is no evidence of pericardial effusion. Mitral Valve: The mitral valve is abnormal. There is moderate calcification of the mitral valve leaflet(s). Mild to moderate mitral annular calcification. No evidence of mitral valve regurgitation. MV peak gradient, 8.5 mmHg. The mean mitral valve gradient is 3.0 mmHg with average heart rate of 82 bpm. Tricuspid Valve: The tricuspid valve is grossly normal. Tricuspid valve regurgitation is not demonstrated. No evidence of tricuspid stenosis. Aortic Valve: The aortic valve is calcified. Aortic valve regurgitation is not visualized. Mild to moderate aortic valve sclerosis/calcification is present, without any evidence of aortic stenosis. Aortic valve mean gradient measures 5.0 mmHg. Aortic valve peak gradient measures 10.4 mmHg. Aortic valve area, by VTI measures 2.63 cm. Pulmonic Valve: The pulmonic valve was grossly normal. Pulmonic valve regurgitation is not visualized. No evidence of pulmonic stenosis. Aorta: The aortic root and ascending aorta are structurally normal, with no evidence of dilitation. Venous: The inferior vena cava is normal in size with greater than 50% respiratory variability, suggesting right atrial pressure of 3  mmHg. IAS/Shunts: The atrial septum is grossly normal.  LEFT VENTRICLE PLAX 2D LVIDd:         4.70 cm  Diastology LVIDs:         3.20 cm  LV e' medial:    7.72 cm/s LV PW:         1.30 cm  LV E/e' medial:  15.9 LV IVS:        1.20 cm  LV e' lateral:   7.83 cm/s LVOT diam:  2.10 cm  LV E/e' lateral: 15.7 LV SV:         75 LV SV Index:   33 LVOT Area:     3.46 cm  RIGHT VENTRICLE             IVC RV S prime:     11.20 cm/s  IVC diam: 1.70 cm TAPSE (M-mode): 2.0 cm LEFT ATRIUM             Index       RIGHT ATRIUM           Index LA diam:        4.90 cm 2.18 cm/m  RA Area:     16.00 cm LA Vol (A2C):   80.4 ml 35.71 ml/m RA Volume:   43.50 ml  19.32 ml/m LA Vol (A4C):   87.5 ml 38.87 ml/m LA Biplane Vol: 88.6 ml 39.35 ml/m  AORTIC VALVE AV Area (Vmax):    2.43 cm AV Area (Vmean):   2.62 cm AV Area (VTI):     2.63 cm AV Vmax:           161.00 cm/s AV Vmean:          100.000 cm/s AV VTI:            0.286 m AV Peak Grad:      10.4 mmHg AV Mean Grad:      5.0 mmHg LVOT Vmax:         113.00 cm/s LVOT Vmean:        75.500 cm/s LVOT VTI:          0.217 m LVOT/AV VTI ratio: 0.76  AORTA Ao Root diam: 3.30 cm Ao Asc diam:  3.10 cm MITRAL VALVE MV Area (PHT): 4.21 cm     SHUNTS MV Area VTI:   2.09 cm     Systemic VTI:  0.22 m MV Peak grad:  8.5 mmHg     Systemic Diam: 2.10 cm MV Mean grad:  3.0 mmHg MV Vmax:       1.46 m/s MV Vmean:      78.5 cm/s MV Decel Time: 180 msec MV E velocity: 123.00 cm/s MV A velocity: 113.00 cm/s MV E/A ratio:  1.09 Rudean Haskell MD Electronically signed by Rudean Haskell MD Signature Date/Time: 04/01/2021/4:03:06 PM    Final      The results of significant diagnostics from this hospitalization (including imaging, microbiology, ancillary and laboratory) are listed below for reference.     Microbiology: No results found for this or any previous visit (from the past 240 hour(s)).   Labs: BNP (last 3 results) No results for input(s): BNP in the last 8760 hours. Basic  Metabolic Panel: Recent Labs  Lab 04/05/21 0822 04/06/21 1616 04/07/21 0953 04/09/21 0900  NA 129* 128* 128* 128*  K 3.7 4.2 4.5 3.9  CL 94* 94* 94* 92*  CO2 '23 25 22 25  '$ GLUCOSE 156* 387* 281* 331*  BUN 104* 53* 58* 50*  CREATININE 4.39* 3.45* 3.30* 3.04*  CALCIUM 8.7* 8.3* 8.7* 8.6*  PHOS 6.6*  --  5.4*  --    Liver Function Tests: Recent Labs  Lab 04/05/21 0822 04/07/21 0953  ALBUMIN 2.4* 2.6*   No results for input(s): LIPASE, AMYLASE in the last 168 hours. No results for input(s): AMMONIA in the last 168 hours. CBC: Recent Labs  Lab 04/05/21 1030 04/05/21 2217 04/06/21 1616 04/07/21 0032 04/09/21 0900  WBC 11.1* 11.1* 10.5 9.9 10.9*  NEUTROABS  --   --  8.0*  --   --   HGB 4.5* 5.4* 6.8* 7.4* 8.3*  HCT 14.0* 16.4* 20.9* 22.6* 25.7*  MCV 90.9 85.4 88.9 86.6 89.2  PLT 226 195 215 211 174   Cardiac Enzymes: No results for input(s): CKTOTAL, CKMB, CKMBINDEX, TROPONINI in the last 168 hours. BNP: Invalid input(s): POCBNP CBG: Recent Labs  Lab 04/08/21 2050 04/09/21 1344 04/09/21 1633 04/10/21 0814 04/10/21 1141  GLUCAP 152* 286* 339* 261* 310*   D-Dimer No results for input(s): DDIMER in the last 72 hours. Hgb A1c No results for input(s): HGBA1C in the last 72 hours. Lipid Profile No results for input(s): CHOL, HDL, LDLCALC, TRIG, CHOLHDL, LDLDIRECT in the last 72 hours. Thyroid function studies No results for input(s): TSH, T4TOTAL, T3FREE, THYROIDAB in the last 72 hours.  Invalid input(s): FREET3 Anemia work up No results for input(s): VITAMINB12, FOLATE, FERRITIN, TIBC, IRON, RETICCTPCT in the last 72 hours. Urinalysis    Component Value Date/Time   COLORURINE AMBER (A) 03/30/2021 0239   APPEARANCEUR HAZY (A) 03/30/2021 0239   LABSPEC 1.018 03/30/2021 0239   PHURINE 5.0 03/30/2021 0239   GLUCOSEU NEGATIVE 03/30/2021 0239   HGBUR NEGATIVE 03/30/2021 0239   BILIRUBINUR NEGATIVE 03/30/2021 0239   KETONESUR NEGATIVE 03/30/2021 0239    PROTEINUR NEGATIVE 03/30/2021 0239   NITRITE NEGATIVE 03/30/2021 0239   LEUKOCYTESUR NEGATIVE 03/30/2021 0239   Sepsis Labs Invalid input(s): PROCALCITONIN,  WBC,  LACTICIDVEN Microbiology No results found for this or any previous visit (from the past 240 hour(s)).   Time coordinating discharge in minutes: 65  SIGNED:   Debbe Odea, MD  [facility] 04/10/2021, 3:19 PM

## 2021-04-10 NOTE — Progress Notes (Signed)
Pharmacy Antibiotic Note  Jeffrey [name] is a 60 y.o. 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  Patient continues on Vancomycin for MRSA bacteremia / joint infection with plans to continue x 6 weeks until 05/15/2021.  WBC 10.9, afebrile.   Plan: Continue with 1000 mg IV qHD MWF starting 5/13 Goal pre-HD level 15-25 mcg/ml Follow-up HD scheule and associated labs.  Height: '5\' 5"'$  (165.1 cm) Weight: 110.5 kg (243 lb 9.7 oz) IBW/kg (Calculated) : 61.5  Temp (24hrs), Avg:99.1 F (37.3 C), Min:98.3 F (36.8 C), Max:99.7 F (37.6 C)  Recent Labs  Lab 04/03/21 1226 04/05/21 0822 04/05/21 1030 04/05/21 2217 04/06/21 1616 04/07/21 0032 04/07/21 0953 04/09/21 0900  WBC  --   --  11.1* 11.1* 10.5 9.9  --  10.9*  CREATININE 3.80* 4.39*  --   --  3.45*  --  3.30* 3.04*  VANCORANDOM  --   --   --   --   --   --  13  --     Estimated Creatinine Clearance: 29.6 mL/min (A) (by C-G formula based on SCr of 3.04 mg/dL (H)).    Allergies  Allergen Reactions  . Bupropion Swelling  . Dexmedetomidine Nausea And Vomiting    Other reaction(s): Other (See Comments) Dose-limiting bradycardia Dose-limiting bradycardia   . Ibuprofen Shortness Of Breath and Swelling    Pt tolerates aspirin Pt tolerates aspirin   . Pioglitazone Anaphylaxis and Rash  . Tomato Anaphylaxis    Antimicrobials this admission: Cefepime 5/2>>5/2 Vancomycin 5/2>> (6/18)  Dose adjustments this admission: 5/11 pre-HD VR 13, estimated post 7.28, Vanc 1500 x 1 extra dose, then resume 1gm qHD  Microbiology results: 5/4 BCx: negative 5/2 Bcx: MRSA 2/4 5/2 Fluid from L knee: negF 5/1 Ucx: negF  Thank you for allowing pharmacy to be a part of this patient's care.  Wilson Singer, PharmD PGY1 Pharmacy Resident 04/10/2021 9:32 AM

## 2021-04-10 NOTE — Progress Notes (Signed)
[facility] Progress Note   Subjective:     Jeffrey [name] was seen and examined at bedside today. Last HD 04/09/21-tolerated UF 3.5L. Patient with no complaints at this time. Denies SOB, CP, ABD pain, and N/V/D. Plan for HD on Monday 04/12/21.  Objective Vitals:   04/09/21 1350 04/09/21 1636 04/10/21 0811 04/10/21 1139  BP: (!) 113/55 (!) 135/55 (!) 120/56 (!) 120/52  Pulse: 88 89 81 79  Resp: '18 19 20 16  '$ Temp: 98.3 F (36.8 C) 99.7 F (37.6 C) 99.2 F (37.3 C) 99 F (37.2 C)  TempSrc: Oral Oral Oral Oral  SpO2: 100% 100% 98% 96%  Weight: 110.5 kg     Height:       Physical Exam General: Appears comfortable; No acute respiratory distress Heart: Normal S1 and S2; No murmurs, gallops, or friction rub Lungs: Clear anteriorly and laterally; No wheezing, rales, or rhonchi Abdomen: Large, soft, non-tender Extremities: L AKA-wrapped with ACE; No RLE edema Dialysis Access: R AVG (+) Bruit/Thrill  Filed Weights   04/09/21 0828 04/09/21 1258 04/09/21 1350  Weight: 114.1 kg 110.5 kg 110.5 kg    Intake/Output Summary (Last 24 hours) at 04/10/2021 1401 Last data filed at 04/10/2021 1336 Gross per 24 hour  Intake 720 ml  Output 650 ml  Net 70 ml    Additional Objective Labs: Basic Metabolic Panel: Recent Labs  Lab 04/05/21 0822 04/06/21 1616 04/07/21 0953 04/09/21 0900  NA 129* 128* 128* 128*  K 3.7 4.2 4.5 3.9  CL 94* 94* 94* 92*  CO2 '23 25 22 25  '$ GLUCOSE 156* 387* 281* 331*  BUN 104* 53* 58* 50*  CREATININE 4.39* 3.45* 3.30* 3.04*  CALCIUM 8.7* 8.3* 8.7* 8.6*  PHOS 6.6*  --  5.4*  --    Liver Function Tests: Recent Labs  Lab 04/05/21 0822 04/07/21 0953  ALBUMIN 2.4* 2.6*   No results for input(s): LIPASE, AMYLASE in the last 168 hours. CBC: Recent Labs  Lab 04/05/21 1030 04/05/21 2217 04/06/21 1616 04/07/21 0032 04/09/21 0900  WBC 11.1* 11.1* 10.5 9.9 10.9*  NEUTROABS  --   --  8.0*  --   --   HGB 4.5* 5.4* 6.8* 7.4* 8.3*  HCT 14.0*  16.4* 20.9* 22.6* 25.7*  MCV 90.9 85.4 88.9 86.6 89.2  PLT 226 195 215 211 174   Blood Culture    Component Value Date/Time   SDES BLOOD RIGHT ARM 03/31/2021 0700   SDES BLOOD RIGHT ARM 03/31/2021 0700   SPECREQUEST  03/31/2021 0700    BOTTLES DRAWN AEROBIC AND ANAEROBIC Blood Culture adequate volume   SPECREQUEST  03/31/2021 0700    BOTTLES DRAWN AEROBIC AND ANAEROBIC Blood Culture adequate volume   CULT  03/31/2021 0700    NO GROWTH 5 DAYS Performed at [facility], [place] [address] [place], [place] [address]    CULT  03/31/2021 0700    NO GROWTH 5 DAYS Performed at [facility], [place] [address], [place], [place] [address]    REPTSTATUS 04/07/2021 FINAL 03/31/2021 0700   REPTSTATUS 04/07/2021 FINAL 03/31/2021 0700    Cardiac Enzymes: No results for input(s): CKTOTAL, CKMB, CKMBINDEX, TROPONINI in the last 168 hours. CBG: Recent Labs  Lab 04/08/21 2050 04/09/21 1344 04/09/21 1633 04/10/21 0814 04/10/21 1141  GLUCAP 152* 286* 339* 261* 310*    Lab Results  Component Value Date   INR 1.2 03/28/2021    Medications: . magnesium sulfate bolus IVPB    . vancomycin 1,000  mg (04/09/21 1205)   . (feeding supplement) PROSource Plus  30 mL Oral BID BM  . sodium chloride   Intravenous Once  . sodium chloride   Intravenous Once  . sodium chloride   Intravenous Once  . amLODipine  5 mg Oral QHS  . atorvastatin  40 mg Oral QHS  . calcium acetate  1,334 mg Oral TID with meals  . carvedilol  12.5 mg Oral BID WC  . Chlorhexidine Gluconate Cloth  6 each Topical Q0600  . darbepoetin (ARANESP) injection - DIALYSIS  150 mcg Intravenous Q Fri-HD  . docusate sodium  100 mg Oral Daily  . gabapentin  300 mg Oral QHS  . insulin aspart  0-15 Units Subcutaneous TID WC  . insulin aspart  0-5 Units Subcutaneous QHS  . insulin regular human CONCENTRATED  110 Units Subcutaneous Q breakfast   And  . insulin regular human CONCENTRATED  45 Units Subcutaneous Q supper  . mouth  rinse  15 mL Mouth Rinse BID  . nortriptyline  20 mg Oral QHS  . pantoprazole  40 mg Oral Daily  . polyethylene glycol  17 g Oral BID  . senna  1 tablet Oral Daily  . sertraline  100 mg Oral Daily    Dialysis Orders: [facility] MWF  4h 121kg 3K/2.5 bath RUE AVG/ R IJ TDC Hep 4500+ 2519md -Mircera 100 ug q2 last 4/27  Assessment/Plan: 1. MRSA bacteremia: BCx 5/1 +, repeat BCx 5/3 negative. On Vancomycin through 6/18 with HD.TDC removed 5/3, no need to replace as AVG in use. 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 2. ESRD:Continue HD per usual MWF schedule -Last HD 5/13-tolerated UF 3.5L. Hold heparin  3. HD [facility] 4/14 that showed patent AVG.TDC removed per IR 5/3, AVG in use. 4. Anemiaof ESRD/ABLA : Likely post-op blood loss. Hgb 7.4 S/p 2 units prbcs 5/9, 2 units 5/10, and 1 unit 5/11.Hgb now 8.3.Trend HH as patient allows. ESA dose given today. Monitor trend.  5. HTN/ volume:BP stable,EDW will need to be lowered s/p AKA. 6. PTSD:Continue home meds. 7. COPD:Per primary. 8. DM2: Per primary, on insulin. 9. 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 Will monitor trend. No VDRA. 10. Nutrition: Alb low,continuesupplements. 11. Dispo:For SNF placement   CTobie Poet NP [place]5/14/2022,2:01 PM  LOS: 12 days

## 2021-04-10 NOTE — TOC Progression Note (Signed)
Transition of Care [facility]) - Progression Note    Patient Details  Name: Jeffrey [name] MRN: [id number] Date of Birth: October 26, 1961  Transition of Care [facility]) CM/SW [place], LCSW Phone Number: 04/10/2021, 4:12 PM  Clinical Narrative:    CSW contacted by MD and RNCM that patient was medically cleared for DC and a summary was put in. CSW contacted patient and informed of this and discussed he could select one of the SNF bed offers he has or be discharged home. CSW noted patient expressed his concerns and CSW reiterated he is cleared for discharge and decision needs to be made. Patient selected [facility]. CSW reached out to [facility] admissions and lefdt a v/m. CSW contacted their facility staff and was informed they do not have any admissions coordinators on the weekend. CSW updated team and notes bed unable to be confirmed and new auth will need to be completed if this holds over to Monday.    Expected Discharge Plan: [place] Barriers to Discharge: Insurance Authorization,Continued Medical Work up  Expected Discharge Plan and Services Expected Discharge Plan: [place] In-house Referral: Clinical Social Work   Post Acute Care Choice: [place] Living arrangements for the past 2 months: Single Family Home Expected Discharge Date: 04/10/21                                     Social Determinants of Health (SDOH) Interventions    Readmission Risk Interventions Readmission Risk Prevention Plan 04/07/2021  Transportation Screening Complete  Medication Review [employer]) Referral to Pharmacy  PCP or Specialist appointment within 3-5 days of discharge Complete  HRI or Home Care Consult Complete  SW Recovery Care/Counseling Consult Complete  Palliative Care Screening Not [place] Complete

## 2021-04-10 NOTE — Progress Notes (Signed)
While  Giving IV Dilaudid to pt notice leaking of med in IV line, pt complaining of putting the medicine in IV saline for push, explanation was made the importance of mixing the med to saline before IV push, pt agreed but asked to changed to change IV med to new one, med was 0.5 mg dilaudid in saline with total dilution of 8 ml, 5 ml remaining on syringe witnessed by Zoila Shutter, med wasted

## 2021-04-11 LAB — GLUCOSE, CAPILLARY
Glucose-Capillary: 343 mg/dL — ABNORMAL HIGH (ref 70–99)
Glucose-Capillary: 261 mg/dL — ABNORMAL HIGH (ref 70–99)
Glucose-Capillary: 167 mg/dL — ABNORMAL HIGH (ref 70–99)

## 2021-04-11 MED ORDER — LIDOCAINE-PRILOCAINE 2.5-2.5 % EX CREA: 1.0000 "application " | TOPICAL_CREAM | CUTANEOUS | Status: DC | PRN

## 2021-04-11 MED ORDER — ALTEPLASE 2 MG IJ SOLR: 2.0000 mg | Freq: Once | INTRAMUSCULAR | Status: DC | PRN

## 2021-04-11 MED ORDER — SODIUM CHLORIDE 0.9 % IV SOLN: 100.0000 mL | INTRAVENOUS | Status: DC | PRN

## 2021-04-11 MED ORDER — GABAPENTIN 100 MG PO CAPS
200.0000 mg | ORAL_CAPSULE | Freq: Two times a day (BID) | ORAL | Status: DC
  Administered 2021-04-11 – 2021-04-12 (×2): 200 mg via ORAL
  Filled 2021-04-11 (×2): qty 2

## 2021-04-11 MED ORDER — ACETAMINOPHEN 325 MG PO TABS
650.0000 mg | ORAL_TABLET | Freq: Four times a day (QID) | ORAL | Status: DC
  Administered 2021-04-11 – 2021-04-14 (×11): 650 mg via ORAL
  Filled 2021-04-11 (×11): qty 2

## 2021-04-11 MED ORDER — HEPARIN SODIUM (PORCINE) 1000 UNIT/ML DIALYSIS: 1000.0000 [IU] | INTRAMUSCULAR | Status: DC | PRN

## 2021-04-11 MED ORDER — PENTAFLUOROPROP-TETRAFLUOROETH EX AERO: 1.0000 "application " | INHALATION_SPRAY | CUTANEOUS | Status: DC | PRN

## 2021-04-11 MED ORDER — GABAPENTIN 300 MG PO CAPS
300.0000 mg | ORAL_CAPSULE | Freq: Every day | ORAL | Status: DC
  Administered 2021-04-11: 300 mg via ORAL
  Filled 2021-04-11: qty 1

## 2021-04-11 NOTE — Progress Notes (Signed)
PROGRESS NOTE    Jeffrey [name]   [id number]  DOB: 1961/01/15  DOA: 03/28/2021 PCP: Merryl Hacker, No   Brief Narrative:  Jeffrey [name] 60 year old with past medical history significant for ESRD,  diabetes presents to the ED complaining of fever.  Accompanied by vomiting, diaphoresis and mild confusion.  He has been having left knee pain.  Patient has a history of chronic knee  pain and swelling left knee, prior imagine at [facility].  Patient has a history of knee replacement/hardware bilaterally.   In the ED left knee imaging showed massive knee joint effusion, Temp 103 degrees, WBC 12K. Patient was started empirically on IV antibiotics.  Patient was evaluated by orthopedic surgery, who is recommending AKA.   He was also diagnosed with MRSA bacteremia & underwent hemodialysis catheter removal.  Currently has RUE AVG (01/2021).  ID and orthopedic consulted. Underwent Left AKA by Dr Lucia Gaskins  on 5/7. Patient decline TEE. ID recommends IV Vancomycin through June 18 with HD.   Likely Acute blood loss anemia, post surgery. Patient refused labs 04/04/2021. Labs  On 5/09 showed Hb 4.5.     Subjective: Still having phantom limb pain. Also has pain in muscles of left thigh from "working" the leg.    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- recommendations were to perform an AKA vs transfer to a tertiary care facility- patient decided on AKA - left AKA 5/7 - 5/13> Vac removed by Dr Lucia Gaskins- he will need to remove sutures in 4 wks - start Gabapentin morning and afternoon 200 mg- cont evening dose of 300 mg - cont Amitriptyline - advised him to use less Dilaudid and Oxycodone  Hyponatremia - follow with dialysis  Anemia - drop noted from 8.8 on 5/7  to 4.5 on 5/9- no blood draw on 5/8  - due to ESRD, acute infection and possibly acute blood loss from AKA - 3 U PRBC required for HB of 4.5 which has improved to 7.4 by 5/11 - 5/12> unable to get blood after multiple stick and he did not allow further blood draws - 5/13 Hb 8.3  DM 2 - increased AM insulin on 5/11 as sugars high in PM-   - sugars ranging from 168- 343- follow today - A1c 8.2     HTN - cont Coreg and Amlodipine     ESRD (end stage renal disease) -cont dialysis per nephrology- plan to reduce dry weight due to AKA - HD cath removed  Hypersomnolence - resolved after holding Gabapentin, Nortriptyline, Trazodone, Valium   Morbid obesity   Body mass index is 40.54 kg/m.    Time spent in minutes: 35 DVT prophylaxis: SCD's Start: 04/04/21 0402 SCDs Start: 03/29/21 0842 Code Status: Full code Family Communication:  Level of Care: Level of care: Med-Surg Disposition Plan:  Status is: Inpatient  Remains inpatient appropriate because:Inpatient level of care appropriate due to severity of illness   Dispo: The patient is from: Home              Anticipated d/c is to: SNF              Patient currently is not medically stable to d/c.   Difficult to place patient No  Consultants:   ID  Nephrology  Orthopedic surgery Procedures:   Left AKA Antimicrobials:  Anti-infectives (From admission, onward)   Start     Dose/Rate Route Frequency Ordered Stop   04/12/21 0000  vancomycin (VANCOCIN) 1-5 GM/200ML-% SOLN        1,000 mg Intravenous Every M-W-F (Hemodialysis) 04/10/21 1519     04/09/21 1204  vancomycin (VANCOCIN) 1-5 GM/200ML-% IVPB       Note to Pharmacy: Wallace Cullens   : cabinet override      04/09/21 1204 04/09/21 1635   04/09/21 1200  vancomycin (VANCOCIN) IVPB 1000 mg/200 mL premix        1,000 mg 200 mL/hr over 60 Minutes Intravenous Every M-W-F (Hemodialysis) 04/07/21 1140     04/07/21 1145  vancomycin (VANCOREADY) IVPB 1500 mg/300  mL        1,500 mg 150 mL/hr over 120 Minutes Intravenous To Hemodialysis 04/07/21 1140 04/07/21 1428   04/03/21 1130  ceFAZolin (ANCEF) IVPB 3g/100 mL premix  Status:  Discontinued        3 g 200 mL/hr over 30 Minutes Intravenous On call to O.R. 04/03/21 1008 04/03/21 1459   04/03/21 1010  ceFAZolin (ANCEF) 3-0.9 GM/100ML-% IVPB       Note to Pharmacy: Baird Lyons  : cabinet override      04/03/21 1010 04/03/21 2214   04/03/21 0915  vancomycin (VANCOREADY) IVPB 750 mg/150 mL        750 mg 150 mL/hr over 60 Minutes Intravenous  Once 04/03/21 0829 04/03/21 1622   03/29/21 2000  ceFEPIme (MAXIPIME) 2 g in sodium chloride 0.9 % 100 mL IVPB  Status:  Discontinued        2 g 200 mL/hr over 30 Minutes Intravenous Every M-W-F (2000) 03/29/21 0321 03/29/21 1541   03/29/21 1200  vancomycin (VANCOCIN) IVPB 1000 mg/200 mL premix  Status:  Discontinued        1,000 mg 200 mL/hr over 60 Minutes Intravenous Every M-W-F (Hemodialysis) 03/29/21 0321 04/07/21 1140   03/29/21 0000  vancomycin (VANCOCIN) 2,500 mg in sodium chloride 0.9 % 500 mL IVPB        2,500 mg 250 mL/hr over 120 Minutes Intravenous  Once 03/28/21 2346 03/29/21 0340   03/29/21 0000  ceFEPIme (MAXIPIME) 2 g in sodium chloride 0.9 % 100 mL IVPB        2 g 200 mL/hr over 30 Minutes Intravenous  Once 03/28/21 2346 03/29/21 0126       Objective: Vitals:   04/10/21 2336 04/11/21 0421 04/11/21 0735 04/11/21 1209  BP: (!) 136/50 (!) 117/51 (!) 117/35 (!) 129/48  Pulse: 87 81 84 82  Resp: '18 17 19 18  '$ Temp: 98.9 F (37.2 C) 98.8 F (37.1 C) 98.4 F (36.9 C)   TempSrc: Axillary Axillary    SpO2: 95% 94% 94% 98%  Weight:      Height:        Intake/Output Summary (Last 24 hours) at 04/11/2021 1530 Last data filed at 04/10/2021 2018 Gross per 24 hour  Intake 340 ml  Output 550 ml  Net -210 ml   Filed Weights   04/09/21 0828 04/09/21 1258 04/09/21 1350  Weight: 114.1 kg 110.5 kg 110.5 kg    Examination: General exam:  Appears comfortable  HEENT: PERRLA, oral mucosa moist, no sclera icterus or thrush Respiratory system: Clear to auscultation. Respiratory effort normal. Cardiovascular system: S1 & S2 heard, regular rate and rhythm Gastrointestinal system: Abdomen soft, non-tender, nondistended. Normal bowel  sounds   Central nervous system: Alert and oriented. No focal neurological deficits. Extremities: No cyanosis, clubbing or edema Skin: No rashes or ulcers Psychiatry:  Mood & affect appropriate.     Data Reviewed: I have personally reviewed following labs and imaging studies  CBC: Recent Labs  Lab 04/05/21 1030 04/05/21 2217 04/06/21 1616 04/07/21 0032 04/09/21 0900  WBC 11.1* 11.1* 10.5 9.9 10.9*  NEUTROABS  --   --  8.0*  --   --   HGB 4.5* 5.4* 6.8* 7.4* 8.3*  HCT 14.0* 16.4* 20.9* 22.6* 25.7*  MCV 90.9 85.4 88.9 86.6 89.2  PLT 226 195 215 211 [id number]   Basic Metabolic Panel: Recent Labs  Lab 04/05/21 0822 04/06/21 1616 04/07/21 0953 04/09/21 0900  NA 129* 128* 128* 128*  K 3.7 4.2 4.5 3.9  CL 94* 94* 94* 92*  CO2 '23 25 22 25  '$ GLUCOSE 156* 387* 281* 331*  BUN 104* 53* 58* 50*  CREATININE 4.39* 3.45* 3.30* 3.04*  CALCIUM 8.7* 8.3* 8.7* 8.6*  PHOS 6.6*  --  5.4*  --    GFR: Estimated Creatinine Clearance: 29.6 mL/min (A) (by C-G formula based on SCr of 3.04 mg/dL (H)). Liver Function Tests: Recent Labs  Lab 04/05/21 0822 04/07/21 0953  ALBUMIN 2.4* 2.6*   No results for input(s): LIPASE, AMYLASE in the last 168 hours. No results for input(s): AMMONIA in the last 168 hours. Coagulation Profile: No results for input(s): INR, PROTIME in the last 168 hours. Cardiac Enzymes: No results for input(s): CKTOTAL, CKMB, CKMBINDEX, TROPONINI in the last 168 hours. BNP (last 3 results) No results for input(s): PROBNP in the last 8760 hours. HbA1C: No results for input(s): HGBA1C in the last 72 hours. CBG: Recent Labs  Lab 04/10/21 0814 04/10/21 1141 04/10/21 1743  04/10/21 2024 04/11/21 1203  GLUCAP 261* 310* 162* 168* 343*   Lipid Profile: No results for input(s): CHOL, HDL, LDLCALC, TRIG, CHOLHDL, LDLDIRECT in the last 72 hours. Thyroid Function Tests: No results for input(s): TSH, T4TOTAL, FREET4, T3FREE, THYROIDAB in the last 72 hours. Anemia Panel: No results for input(s): VITAMINB12, FOLATE, FERRITIN, TIBC, IRON, RETICCTPCT in the last 72 hours. Urine analysis:    Component Value Date/Time   COLORURINE AMBER (A) 03/30/2021 0239   APPEARANCEUR HAZY (A) 03/30/2021 0239   LABSPEC 1.018 03/30/2021 0239   PHURINE 5.0 03/30/2021 0239   GLUCOSEU NEGATIVE 03/30/2021 0239   HGBUR NEGATIVE 03/30/2021 0239   BILIRUBINUR NEGATIVE 03/30/2021 0239   KETONESUR NEGATIVE 03/30/2021 0239   PROTEINUR NEGATIVE 03/30/2021 0239   NITRITE NEGATIVE 03/30/2021 0239   LEUKOCYTESUR NEGATIVE 03/30/2021 0239   Sepsis Labs: '@LABRCNTIP'$ (procalcitonin:4,lacticidven:4) ) No results found for this or any previous visit (from the past 240 hour(s)).       Radiology Studies: No results found.    Scheduled Meds: . (feeding supplement) PROSource Plus  30 mL Oral BID BM  . sodium chloride   Intravenous Once  . sodium chloride   Intravenous Once  . sodium chloride   Intravenous Once  . acetaminophen  650 mg Oral QID  . amLODipine  5 mg Oral QHS  . atorvastatin  40 mg Oral QHS  . calcium acetate  1,334 mg Oral TID with meals  . carvedilol  12.5 mg Oral BID WC  . Chlorhexidine Gluconate Cloth  6 each Topical Q0600  . darbepoetin (ARANESP) injection - DIALYSIS  150 mcg Intravenous Q Fri-HD  . docusate sodium  100 mg Oral Daily  . gabapentin  200  mg Oral BID  . gabapentin  300 mg Oral QHS  . insulin aspart  0-15 Units Subcutaneous TID WC  . insulin aspart  0-5 Units Subcutaneous QHS  . insulin regular human CONCENTRATED  55 Units Subcutaneous Q supper   And  . insulin regular human CONCENTRATED  120 Units Subcutaneous Q breakfast  . mouth rinse  15 mL  Mouth Rinse BID  . nortriptyline  20 mg Oral QHS  . pantoprazole  40 mg Oral Daily  . polyethylene glycol  17 g Oral BID  . senna  1 tablet Oral Daily  . sertraline  100 mg Oral Daily   Continuous Infusions: . sodium chloride    . sodium chloride    . magnesium sulfate bolus IVPB    . vancomycin 1,000 mg (04/09/21 1205)     LOS: 13 days      Debbe Odea, MD [facility] Pager: [email or web address] 04/11/2021, 3:30 PM

## 2021-04-11 NOTE — Progress Notes (Signed)
[facility] Progress Note   Subjective:     Mr. Capelle was seen and examined at bedside today. Patient with no complaints. Denies SOB, CP, ABD pain, and N/V/D. Plan for HD on Monday 04/12/21. Awaiting SNF placement.  Objective Vitals:   04/10/21 1746 04/10/21 2020 04/10/21 2336 04/11/21 0421  BP: (!) 143/62 (!) 144/62 (!) 136/50 (!) 117/51  Pulse: 79 84 87 81  Resp: '18 17 18 17  '$ Temp: 99.4 F (37.4 C) 99 F (37.2 C) 98.9 F (37.2 C) 98.8 F (37.1 C)  TempSrc: Oral Axillary Axillary Axillary  SpO2: 98% 97% 95% 94%  Weight:      Height:       Physical Exam General:Appears comfortable; No acute respiratory distress Heart:Normal S1 and S2; No murmurs, gallops, or friction rub Lungs:Clear anteriorly and laterally; No wheezing, rales, or rhonchi Abdomen:Large, soft, non-tender Extremities:L AKA-wrapped with ACE; No RLE edema Dialysis Access:R AVG(+) Bruit/Thrill  Filed Weights   04/09/21 0828 04/09/21 1258 04/09/21 1350  Weight: 114.1 kg 110.5 kg 110.5 kg    Intake/Output Summary (Last 24 hours) at 04/11/2021 1058 Last data filed at 04/10/2021 2018 Gross per 24 hour  Intake 580 ml  Output 800 ml  Net -220 ml    Additional Objective Labs: Basic Metabolic Panel: Recent Labs  Lab 04/05/21 0822 04/06/21 1616 04/07/21 0953 04/09/21 0900  NA 129* 128* 128* 128*  K 3.7 4.2 4.5 3.9  CL 94* 94* 94* 92*  CO2 '23 25 22 25  '$ GLUCOSE 156* 387* 281* 331*  BUN 104* 53* 58* 50*  CREATININE 4.39* 3.45* 3.30* 3.04*  CALCIUM 8.7* 8.3* 8.7* 8.6*  PHOS 6.6*  --  5.4*  --    Liver Function Tests: Recent Labs  Lab 04/05/21 0822 04/07/21 0953  ALBUMIN 2.4* 2.6*   No results for input(s): LIPASE, AMYLASE in the last 168 hours. CBC: Recent Labs  Lab 04/05/21 1030 04/05/21 2217 04/06/21 1616 04/07/21 0032 04/09/21 0900  WBC 11.1* 11.1* 10.5 9.9 10.9*  NEUTROABS  --   --  8.0*  --   --   HGB 4.5* 5.4* 6.8* 7.4* 8.3*  HCT 14.0* 16.4* 20.9* 22.6* 25.7*   MCV 90.9 85.4 88.9 86.6 89.2  PLT 226 195 215 211 174   Blood Culture    Component Value Date/Time   SDES BLOOD RIGHT ARM 03/31/2021 0700   SDES BLOOD RIGHT ARM 03/31/2021 0700   SPECREQUEST  03/31/2021 0700    BOTTLES DRAWN AEROBIC AND ANAEROBIC Blood Culture adequate volume   SPECREQUEST  03/31/2021 0700    BOTTLES DRAWN AEROBIC AND ANAEROBIC Blood Culture adequate volume   CULT  03/31/2021 0700    NO GROWTH 5 DAYS Performed at [facility], [place] [address] [place], [place] [address]    CULT  03/31/2021 0700    NO GROWTH 5 DAYS Performed at [facility], [place] [address] [place], [place] [address]    REPTSTATUS 04/07/2021 FINAL 03/31/2021 0700   REPTSTATUS 04/07/2021 FINAL 03/31/2021 0700    Cardiac Enzymes: No results for input(s): CKTOTAL, CKMB, CKMBINDEX, TROPONINI in the last 168 hours. CBG: Recent Labs  Lab 04/09/21 1633 04/10/21 0814 04/10/21 1141 04/10/21 1743 04/10/21 2024  GLUCAP 339* 261* 310* 162* 168*   Iron Studies: No results for input(s): IRON, TIBC, TRANSFERRIN, FERRITIN in the last 72 hours. Lab Results  Component Value Date   INR 1.2 03/28/2021   Studies/Results: No results found.  Medications: . magnesium sulfate bolus IVPB    .  vancomycin 1,000 mg (04/09/21 1205)   . (feeding supplement) PROSource Plus  30 mL Oral BID BM  . sodium chloride   Intravenous Once  . sodium chloride   Intravenous Once  . sodium chloride   Intravenous Once  . acetaminophen  650 mg Oral QID  . amLODipine  5 mg Oral QHS  . atorvastatin  40 mg Oral QHS  . calcium acetate  1,334 mg Oral TID with meals  . carvedilol  12.5 mg Oral BID WC  . Chlorhexidine Gluconate Cloth  6 each Topical Q0600  . darbepoetin (ARANESP) injection - DIALYSIS  150 mcg Intravenous Q Fri-HD  . docusate sodium  100 mg Oral Daily  . gabapentin  200 mg Oral BID  . gabapentin  300 mg Oral QHS  . insulin aspart  0-15 Units Subcutaneous TID WC  . insulin aspart  0-5 Units  Subcutaneous QHS  . insulin regular human CONCENTRATED  55 Units Subcutaneous Q supper   And  . insulin regular human CONCENTRATED  120 Units Subcutaneous Q breakfast  . mouth rinse  15 mL Mouth Rinse BID  . nortriptyline  20 mg Oral QHS  . pantoprazole  40 mg Oral Daily  . polyethylene glycol  17 g Oral BID  . senna  1 tablet Oral Daily  . sertraline  100 mg Oral Daily    Dialysis Orders: [facility] MWF  4h 121kg 3K/2.5 bath RUE AVG/ R IJ TDC Hep 4500+ 2558md -Mircera 100 ug q2 last 4/27  Assessment/Plan: 1. MRSA bacteremia: BCx 5/1 +, repeat BCx 5/3 negative. On Vancomycinthrough 6/18 with HD.TDC removed 5/3, no need to replace as AVG in use. 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 2. ESRD:Continue HD per usual MWF schedule -plan for HD on 04/12/21-Hold heparin.  3. [facility]14 that showed patent AVG.TDC removed per IR 5/3, AVG in use. 4. Anemiaof ESRD/ABLA : Likely post-op blood loss. Hgb 7.4 S/p 2 units prbcs 5/9, 2 units 5/10, and1 unit 5/11.Hgb now 8.3.Trend HH as patient allows. Last ESA dose 5/13-Monitor trend. 5. HTN/ volume:BP stable,EDW will need to be lowered s/p AKA. 6. PTSD:Continue home meds. 7. COPD:Per primary. 8. DM2: Per primary, on insulin. 9. MBD:Ca ok,Phosnow at goal-Phoslorecently^'d to 2/meals. Will monitor trend.No VDRA. 10. Nutrition: Alb low,continuesupplements. 11. Dispo:For SNF placement  CTobie Poet NP [place]5/15/2022,10:58 AM  LOS: 13 days

## 2021-04-12 LAB — CBC
HCT: 26 % — ABNORMAL LOW (ref 39.0–52.0)
Hemoglobin: 8.2 g/dL — ABNORMAL LOW (ref 13.0–17.0)
MCH: 28.9 pg (ref 26.0–34.0)
MCHC: 31.5 g/dL (ref 30.0–36.0)
MCV: 91.5 fL (ref 80.0–100.0)
Platelets: 163 10*3/uL (ref 150–400)
RBC: 2.84 MIL/uL — ABNORMAL LOW (ref 4.22–5.81)
RDW: 15.6 % — ABNORMAL HIGH (ref 11.5–15.5)
WBC: 9.3 10*3/uL (ref 4.0–10.5)
nRBC: 0 % (ref 0.0–0.2)

## 2021-04-12 LAB — RENAL FUNCTION PANEL
Albumin: 2.4 g/dL — ABNORMAL LOW (ref 3.5–5.0)
Anion gap: 11 (ref 5–15)
BUN: 73 mg/dL — ABNORMAL HIGH (ref 6–20)
CO2: 24 mmol/L (ref 22–32)
Calcium: 8.5 mg/dL — ABNORMAL LOW (ref 8.9–10.3)
Chloride: 95 mmol/L — ABNORMAL LOW (ref 98–111)
Creatinine, Ser: 3.55 mg/dL — ABNORMAL HIGH (ref 0.61–1.24)
GFR, Estimated: 19 mL/min — ABNORMAL LOW (ref >60)
Glucose, Bld: 311 mg/dL — ABNORMAL HIGH (ref 70–99)
Phosphorus: 4.6 mg/dL (ref 2.5–4.6)
Potassium: 4.3 mmol/L (ref 3.5–5.1)
Sodium: 130 mmol/L — ABNORMAL LOW (ref 135–145)

## 2021-04-12 LAB — GLUCOSE, CAPILLARY
Glucose-Capillary: 237 mg/dL — ABNORMAL HIGH (ref 70–99)
Glucose-Capillary: 279 mg/dL — ABNORMAL HIGH (ref 70–99)
Glucose-Capillary: 163 mg/dL — ABNORMAL HIGH (ref 70–99)
Glucose-Capillary: 195 mg/dL — ABNORMAL HIGH (ref 70–99)

## 2021-04-12 MED ORDER — HEPARIN SODIUM (PORCINE) 1000 UNIT/ML DIALYSIS: 1000.0000 [IU] | INTRAMUSCULAR | Status: DC | PRN

## 2021-04-12 MED ORDER — GABAPENTIN 300 MG PO CAPS
300.0000 mg | ORAL_CAPSULE | Freq: Two times a day (BID) | ORAL | Status: DC
  Administered 2021-04-12 – 2021-04-14 (×4): 300 mg via ORAL
  Filled 2021-04-12 (×4): qty 1

## 2021-04-12 MED ORDER — OXYCODONE HCL 5 MG PO TABS
5.0000 mg | ORAL_TABLET | ORAL | Status: DC | PRN
  Administered 2021-04-12: 10 mg via ORAL
  Administered 2021-04-12: 5 mg via ORAL
  Administered 2021-04-13 (×2): 10 mg via ORAL
  Filled 2021-04-12 (×3): qty 2
  Filled 2021-04-12: qty 1
  Filled 2021-04-12 (×2): qty 2

## 2021-04-12 MED ORDER — VANCOMYCIN HCL IN DEXTROSE 1-5 GM/200ML-% IV SOLN
INTRAVENOUS | Status: AC
  Administered 2021-04-12: 1000 mg via INTRAVENOUS
  Filled 2021-04-12: qty 200

## 2021-04-12 MED ORDER — INSULIN REGULAR HUMAN (CONC) 500 UNIT/ML ~~LOC~~ SOPN
130.0000 [IU] | PEN_INJECTOR | Freq: Every day | SUBCUTANEOUS | Status: DC
  Administered 2021-04-13: 130 [IU] via SUBCUTANEOUS
  Filled 2021-04-12: qty 3

## 2021-04-12 MED ORDER — SODIUM CHLORIDE 0.9 % IV SOLN: 100.0000 mL | INTRAVENOUS | Status: DC | PRN

## 2021-04-12 MED ORDER — GABAPENTIN 100 MG PO CAPS
100.0000 mg | ORAL_CAPSULE | Freq: Once | ORAL | Status: AC
  Administered 2021-04-12: 100 mg via ORAL
  Filled 2021-04-12: qty 1

## 2021-04-12 MED ORDER — INSULIN REGULAR HUMAN (CONC) 500 UNIT/ML ~~LOC~~ SOPN
65.0000 [IU] | PEN_INJECTOR | Freq: Every day | SUBCUTANEOUS | Status: DC
  Administered 2021-04-12: 65 [IU] via SUBCUTANEOUS

## 2021-04-12 MED ORDER — MORPHINE SULFATE (PF) 2 MG/ML IV SOLN
2.0000 mg | Freq: Once | INTRAVENOUS | Status: AC
  Administered 2021-04-12: 2 mg via INTRAVENOUS
  Filled 2021-04-12: qty 1

## 2021-04-12 MED ORDER — PENTAFLUOROPROP-TETRAFLUOROETH EX AERO: 1.0000 "application " | INHALATION_SPRAY | CUTANEOUS | Status: DC | PRN

## 2021-04-12 MED ORDER — GABAPENTIN 300 MG PO CAPS
300.0000 mg | ORAL_CAPSULE | Freq: Three times a day (TID) | ORAL | Status: DC
  Filled 2021-04-12: qty 1

## 2021-04-12 MED ORDER — LIDOCAINE-PRILOCAINE 2.5-2.5 % EX CREA: 1.0000 "application " | TOPICAL_CREAM | CUTANEOUS | Status: DC | PRN

## 2021-04-12 MED ORDER — ALTEPLASE 2 MG IJ SOLR: 2.0000 mg | Freq: Once | INTRAMUSCULAR | Status: DC | PRN

## 2021-04-12 MED ORDER — LIDOCAINE HCL (PF) 1 % IJ SOLN: 5.0000 mL | INTRAMUSCULAR | Status: DC | PRN

## 2021-04-12 NOTE — Plan of Care (Signed)

## 2021-04-12 NOTE — TOC Progression Note (Signed)
Transition of Care [facility]) - Progression Note    Patient Details  Name: Jeffrey [name] MRN: [id number] Date of Birth: [birth year]  Transition of Care [facility]) CM/SW Contact  Joanne Chars, LCSW Phone Number: 04/12/2021, 0900 Clinical Narrative:   CSW contacted Ebony Hail at [facility] regarding pt accepting bed offer at [place] and being ready for DC today.  Ebony Hail reports that facility has multiple admissions, she will have to check when they can take him.  She called back, stated it could be Thursday before a bed is available, CSW told her this would be too long of a wait as pt is medically ready for DC.  She will contact her supervisor and see if something can be arranged.  11:00: Ebony Hail has not been able to confirm anything yet.  1230: Ebony Hail called back about pt insurance, CSW verified it is [employer]  [facility] had [employer] in the system.  Still trying to get update from her boss.  1345: CSW spoke to [facility] they will need updated clinicals if pt does not DC today: PT and MD notes.  CSW spoke with PT and pt is on the schedule, currently at HD.  CSW spoke with Ebony Hail who does not have an update.  1400:  CSW called [facility] regarding their bed offer.  Admissions staff at lunch, LM.    Expected Discharge Plan: [place] Barriers to Discharge: Insurance Authorization,Continued Medical Work up  Expected Discharge Plan and Services Expected Discharge Plan: [place] In-house Referral: Clinical Social Work   Post Acute Care Choice: [place] Living arrangements for the past 2 months: Single Family Home Expected Discharge Date: 04/10/21                                     Social Determinants of Health (SDOH) Interventions    Readmission Risk Interventions Readmission Risk Prevention Plan 04/07/2021  Transportation Screening Complete  Medication Review Press photographer) Referral to Pharmacy  PCP or  Specialist appointment within 3-5 days of discharge Complete  HRI or Home Care Consult Complete  SW Recovery Care/Counseling Consult Complete  Palliative Care Screening Not [place] Complete

## 2021-04-12 NOTE — Progress Notes (Signed)
Physical Therapy Treatment Patient Details Name: Jeffrey [name] MRN: [id number] DOB: May 21, 1961 Today's Date: 04/12/2021    History of Present Illness 60 y.o. male presents to [facility] ED on 03/28/2021 with reports of fever, chills, nausea/vomiting, confusion. Pt reports significant L knee pain, imaging revealing massive effusion. 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 PMH includes ESRD, CHF, COPD, DMII, HTN, PTSD.    PT Comments    Patient received in bed, requesting to get up to the chair. PT then set up room for lateral scoot transfer into the recliner going to his right, and pulled off the end of the bed so we would have more space to do transfer. He got very irritated with this, perseverated on the end of the bed having been taken off, then impulsively reached for the bed rail and tried to do a stand pivot into recliner, ignoring cues from therapist. PT had to urgently pull chair up under him to prevent a fall. Kept perseverating and telling me that I don't know how to set up transfers for him even though he has transferred this same way via lateral scoot before- education provided that there are multiple ways to perform the same transfer, it is good to be able to do different sorts of transfers, and that PT and patient need to be on the same page for safety purposes. Left up in recliner with all needs met, chair alarm active. Continue to recommend SNF.   Follow Up Recommendations  SNF     Equipment Recommendations  Rolling walker with 5" wheels;Hospital bed;Wheelchair (measurements PT);Wheelchair cushion (measurements PT);3in1 (PT)    Recommendations for Other Services       Precautions / Restrictions Precautions Precautions: Fall Precaution Comments: wound vac, new L AKA Restrictions Weight Bearing Restrictions: Yes LLE Weight Bearing: Non weight bearing    Mobility  Bed Mobility Overal bed mobility: Needs Assistance Bed Mobility: Supine to  Sit   Sidelying to sit: Min guard;HOB elevated       General bed mobility comments: increased time and effort    Transfers Overall transfer level: Needs assistance Equipment used: 1 person hand held assist Transfers: Sit to/from [facility];Lateral/Scoot Transfers Sit to Stand: Min guard Stand pivot transfers: Min guard      Lateral/Scoot Transfers: Min guard;From elevated surface General transfer comment: able to perform lateral scoot part of the way over to the chair, then became irritated wtih therapist and impulsively tried to do a stand pivot into the recliner while holding onto bed rail, PT had to pull the chair up behind him to prevent a fall but he was able to stand/try to pivot with only min guard  Ambulation/Gait                 Stairs             Wheelchair Mobility    Modified Rankin (Stroke Patients Only)       Balance Overall balance assessment: Needs assistance Sitting-balance support: No upper extremity supported;Feet supported Sitting balance-Leahy Scale: Good Sitting balance - Comments: static sitting   Standing balance support: Bilateral upper extremity supported;During functional activity Standing balance-Leahy Scale: Poor Standing balance comment: reliant on external support                            Cognition Arousal/Alertness: Awake/alert Behavior During Therapy: Flat affect Overall Cognitive Status: Impaired/Different from baseline Area of Impairment: Safety/judgement;Awareness;Problem  solving;Following commands                       Following Commands: Follows one step commands inconsistently Safety/Judgement: Decreased awareness of safety Awareness: Emergent;Intellectual   General Comments: very easily irritated, very rude to therapist this afternoon. When PT asked him to turn off the TV volume bc I could not hear him well, he said "well I don't have hearing issues maybe you should get  that taken care of". Became very impulsive and required me to pull chair up behind him to prevent a fall due to impulsively grabbing bedrail and using that to stand/try to pivot, then told me I did not know how to set up a room to his specifications.      Exercises      General Comments General comments (skin integrity, edema, etc.): very rude and tried to dictate session today      Pertinent Vitals/Pain Pain Assessment: Faces Faces Pain Scale: No hurt Pain Intervention(s): Limited activity within patient's tolerance;Monitored during session    Home Living                      Prior Function            PT Goals (current goals can now be found in the care plan section) Acute Rehab PT Goals Patient Stated Goal: be able to walk again PT Goal Formulation: With patient Time For Goal Achievement: 04/18/21 Potential to Achieve Goals: Good Progress towards PT goals: Progressing toward goals (slowly)    Frequency    Min 2X/week      PT Plan Current plan remains appropriate;Equipment recommendations need to be updated    Co-evaluation              AM-PAC PT "6 Clicks" Mobility   Outcome Measure  Help needed turning from your back to your side while in a flat bed without using bedrails?: None Help needed moving from lying on your back to sitting on the side of a flat bed without using bedrails?: A Lot Help needed moving to and from a bed to a chair (including a wheelchair)?: A Lot Help needed standing up from a chair using your arms (e.g., wheelchair or bedside chair)?: A Lot Help needed to walk in hospital room?: Total Help needed climbing 3-5 steps with a railing? : Total 6 Click Score: 12    End of Session   Activity Tolerance: Patient tolerated treatment well Patient left: in chair;with call bell/phone within reach;with chair alarm set Nurse Communication: Mobility status PT Visit Diagnosis: Unsteadiness on feet (R26.81);Muscle weakness (generalized)  (M62.81);Other abnormalities of gait and mobility (R26.89);Difficulty in walking, not elsewhere classified (R26.2);Pain Pain - Right/Left: Left Pain - part of body: Leg     Time: 1455-1510 PT Time Calculation (min) (ACUTE ONLY): 15 min  Charges:  $Therapeutic Activity: 8-22 mins                     Windell Norfolk, DPT, PN1   Supplemental Physical Therapist [place]    Pager [phone] Acute Rehab Office [phone]

## 2021-04-12 NOTE — Progress Notes (Signed)
PT Cancellation Note  Patient Details Name: Jeffrey [name] MRN: [id number] DOB: [birth year]   Cancelled Treatment:    Reason Eval/Treat Not Completed: Patient at procedure or test/unavailable at HD. Will attempt to return later if time/schedule allow.    Windell Norfolk, DPT, PN1   Supplemental Physical Therapist [facility]    Pager [phone] Acute Rehab Office [phone]

## 2021-04-12 NOTE — Progress Notes (Signed)
Renal Navigator appreciates update from [facility] team that patient will be discharged to [facility] in [place]. Per Social Worker, this will most likely take place tomorrow. Navigator has initiated transient request from [facility] to [facility] and is awaiting acceptance/seat time.  Navigator has submitted records from this hospitalization to [facility] Admissions and will follow closely.   Alphonzo Cruise, [place] Renal Navigator [phone]

## 2021-04-12 NOTE — TOC Progression Note (Addendum)
Transition of Care [facility]) - Progression Note    Patient Details  Name: Chevelle Dirienzo MRN: [id number] Date of Birth: 1961-05-31  Transition of [facility]) CM/SW Contact  Joanne Chars, LCSW Phone Number: 04/12/2021, 8:50 AM  Clinical Narrative:  CSW spoke with Selinda Michaels who reports [facility] will not have a bed today but they have made the bed offer.  She will check on potential admission dates.  HD still needs to be set up.  CSW spoke with Terri Piedra who is working on this. Will need updated insurance auth.     1530: updated clinicals sent to Navi   Expected Discharge Plan: [place] Barriers to Discharge: Insurance Authorization,Continued Medical Work up  Expected Discharge Plan and Services Expected Discharge Plan: [place] In-house Referral: Clinical Social Work   Post Acute Care Choice: [place] Living arrangements for the past 2 months: Single Family Home Expected Discharge Date: 04/10/21                                     Social Determinants of Health (SDOH) Interventions    Readmission Risk Interventions Readmission Risk Prevention Plan 04/07/2021  Transportation Screening Complete  Medication Review Press photographer) Referral to Pharmacy  PCP or Specialist appointment within 3-5 days of discharge Complete  HRI or Home Care Consult Complete  SW Recovery Care/Counseling Consult Complete  Palliative Care Screening Not [place] Complete

## 2021-04-12 NOTE — Progress Notes (Signed)
D/t renal function, ok to adjust gabapentin to '300mg'$  BID per Dr Wynelle Cleveland.   Onnie Boer, PharmD, BCIDP, AAHIVP, CPP Infectious Disease Pharmacist 04/12/2021 3:27 PM

## 2021-04-12 NOTE — Progress Notes (Signed)
[facility] Progress Note   Subjective:   Patient seen and examined at bedside during dialysis.  No specific complaints. Denies CP, SOB, n/v/d, and abdominal pain.   Objective Vitals:   04/12/21 1100 04/12/21 1130 04/12/21 1200 04/12/21 1230  BP: (!) 114/55 122/61 (!) 115/50 (!) 111/51  Pulse: 83 75 77 83  Resp:      Temp:      TempSrc:      SpO2:      Weight:      Height:       Physical Exam General:well appearing male in NAD Heart:RRR, no mrg Lungs:CTAB anteriorly  Abdomen:soft, NTND Extremities: L AKA dressed, no RLE edema Dialysis Access: RU AVG in use   Filed Weights   04/09/21 1258 04/09/21 1350 04/12/21 0946  Weight: 110.5 kg 110.5 kg 118.5 kg    Intake/Output Summary (Last 24 hours) at 04/12/2021 1537 Last data filed at 04/12/2021 0957 Gross per 24 hour  Intake 390 ml  Output 1390 ml  Net -1000 ml    Additional Objective Labs: Basic Metabolic Panel: Recent Labs  Lab 04/07/21 0953 04/09/21 0900 04/12/21 1343  NA 128* 128* 130*  K 4.5 3.9 4.3  CL 94* 92* 95*  CO2 '22 25 24  '$ GLUCOSE 281* 331* 311*  BUN 58* 50* 73*  CREATININE 3.30* 3.04* 3.55*  CALCIUM 8.7* 8.6* 8.5*  PHOS 5.4*  --  4.6   Liver Function Tests: Recent Labs  Lab 04/07/21 0953 04/12/21 1343  ALBUMIN 2.6* 2.4*   CBC: Recent Labs  Lab 04/05/21 2217 04/06/21 1616 04/07/21 0032 04/09/21 0900 04/12/21 1337  WBC 11.1* 10.5 9.9 10.9* 9.3  NEUTROABS  --  8.0*  --   --   --   HGB 5.4* 6.8* 7.4* 8.3* 8.2*  HCT 16.4* 20.9* 22.6* 25.7* 26.0*  MCV 85.4 88.9 86.6 89.2 91.5  PLT 195 215 211 174 163    CBG: Recent Labs  Lab 04/11/21 0732 04/11/21 1203 04/11/21 1649 04/11/21 2120 04/12/21 0744  GLUCAP 237* 343* 261* 167* 279*    Medications: . magnesium sulfate bolus IVPB    . vancomycin 1,000 mg (04/12/21 1218)   . (feeding supplement) PROSource Plus  30 mL Oral BID BM  . sodium chloride   Intravenous Once  . sodium chloride   Intravenous Once  . sodium  chloride   Intravenous Once  . acetaminophen  650 mg Oral QID  . amLODipine  5 mg Oral QHS  . atorvastatin  40 mg Oral QHS  . calcium acetate  1,334 mg Oral TID with meals  . carvedilol  12.5 mg Oral BID WC  . Chlorhexidine Gluconate Cloth  6 each Topical Q0600  . darbepoetin (ARANESP) injection - DIALYSIS  150 mcg Intravenous Q Fri-HD  . docusate sodium  100 mg Oral Daily  . gabapentin  100 mg Oral Once  . gabapentin  300 mg Oral BID  . insulin aspart  0-15 Units Subcutaneous TID WC  . insulin aspart  0-5 Units Subcutaneous QHS  . insulin regular human CONCENTRATED  55 Units Subcutaneous Q supper   And  . insulin regular human CONCENTRATED  120 Units Subcutaneous Q breakfast  . mouth rinse  15 mL Mouth Rinse BID  . nortriptyline  20 mg Oral QHS  . pantoprazole  40 mg Oral Daily  . polyethylene glycol  17 g Oral BID  . senna  1 tablet Oral Daily  . sertraline  100 mg Oral Daily  Dialysis Orders: [facility] MWF  4h 121kg 3K/2.5 bath RUE AVG/ R IJ TDC Hep 4500+ 259md -Mircera 100 ug q2 last 4/27  Assessment/Plan: 1. MRSA bacteremia: BCx 5/1 +, repeat BCx 5/3 negative. On Vancomycinthrough 6/18 with HD.TDC removed 5/3, no need to replace as AVG in use. 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 2. ESRD:on HD MWF-HD today per regular schedule.  3. [facility] that showed patent AVG.TDC removed per IR 5/3, AVG in use. 4. Anemiaof ESRD/ABLA :Hgb drop post op to 7.4 S/p 2 units prbcs 5/9, 2 units 5/10, and1 unit 5/11.Hgb now 8.2.Trend HH as patient allows. Last ESA dose 5/13 5. HTN/ volume:BP stable, continue home meds.  Does not appear grossly volume overloaded.  UF as tolerated.  Will need lower dry on d/c after amputation.  6. PTSD:Continue home meds. 7. COPD:Per  primary. 8. DM2: Per primary, on insulin. 9. MBD:Ca ok,Phosnow at goal-Phoslorecently^'d to 2/meals.No VDRA. 10. Nutrition: Alb low,continuesupplements. 11. Dispo:For SNF placement   LJen Mow PA-C [place]5/16/2022,3:37 PM  LOS: 14 days

## 2021-04-12 NOTE — Progress Notes (Signed)
PROGRESS NOTE    Jeffrey [name]   [id number]  DOB: [birth year]  DOA: 03/28/2021 PCP: Merryl Hacker, No   Brief Narrative:  Jeffrey [name] 60 year [name] with past medical history significant for ESRD,  diabetes presents to the ED complaining of fever.  Accompanied by vomiting, diaphoresis and mild confusion.  He has been having left knee pain.  Patient has a history of chronic knee  pain and swelling left knee, prior imagine at [facility].  Patient has a history of knee replacement/hardware bilaterally.   In the ED left knee imaging showed massive knee joint effusion, Temp 103 degrees, WBC 12K. Patient was started empirically on IV antibiotics.  Patient was evaluated by orthopedic surgery, who is recommending AKA.   He was also diagnosed with MRSA bacteremia & underwent hemodialysis catheter removal.  Currently has RUE AVG (01/2021).  ID and orthopedic consulted. Underwent Left AKA by Dr Lucia Gaskins  on 5/7. Patient decline TEE. ID recommends IV Vancomycin through June 18 with HD.   Likely Acute blood loss anemia, post surgery. Patient refused labs 04/04/2021. Labs  On 5/09 showed Hb 4.5.     Subjective: Ongoing phantom limb pain and pain in left thigh. States he absolutely needs the Dilaudid IV as the Oxycodone does not last long enough. He is hoping to get Dilaudid on dc.     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- recommendations were to perform an AKA vs transfer to a tertiary care facility- patient decided on AKA - left AKA 5/7 - 5/13> Vac removed by Dr Lucia Gaskins- he will need to remove sutures in 4 wks - cont Amitriptyline, APAP QID -  Increase Oxycodone to 10 mg Q4 PRN today in attempt to stop Dilaudid - change Gabapentin to 300 BID for renal  dosing  Hyponatremia - follow with dialysis  Anemia - drop noted from 8.8 on 5/7 to 4.5 on 5/9- no blood draw on 5/8  - due to ESRD, acute infection and possibly acute blood loss from AKA - 3 U PRBC required for HB of 4.5 which has improved to 7.4 by 5/11 - 5/12> unable to get blood after multiple stick and he did not allow further blood draws - 5/13 Hb 8.3  DM 2 - increased AM insulin on 5/11 as sugars high in PM-   - sugars ranging from 168- 343 - highest sugars in afternoons- increase AM Novolog to 120 and PM to 65- A1c 8.2    HTN - cont Coreg and Amlodipine     ESRD (end stage renal disease) -cont dialysis per nephrology- plan to reduce dry weight due to AKA - HD cath removed  Hypersomnolence - resolved after holding Gabapentin, Nortriptyline, Trazodone, Valium - following while increasing narcotics   Morbid obesity   Body mass index is 43.47 kg/m.    Time spent in minutes: 35 DVT prophylaxis: SCD's Start: 04/04/21 0402 SCDs Start: 03/29/21 0842 Code Status: Full code Family Communication:  Level of Care: Level of care: Med-Surg Disposition Plan:  Status is: Inpatient  Remains inpatient appropriate because:Inpatient level of care appropriate due to severity of illness   Dispo: The patient is from: Home  Anticipated d/c is to: SNF              Patient currently is medically stable to d/c.   Difficult to place patient No      Consultants:   ID  Nephrology  Orthopedic surgery Procedures:   Left AKA Antimicrobials:  Anti-infectives (From admission, onward)   Start     Dose/Rate Route Frequency Ordered Stop   04/12/21 0000  vancomycin (VANCOCIN) 1-5 GM/200ML-% SOLN        1,000 mg Intravenous Every M-W-F (Hemodialysis) 04/10/21 1519     04/09/21 1204  vancomycin (VANCOCIN) 1-5 GM/200ML-% IVPB       Note to Pharmacy: Wallace Cullens   : cabinet override      04/09/21 1204 04/09/21 1635   04/09/21 1200  vancomycin (VANCOCIN) IVPB  1000 mg/200 mL premix        1,000 mg 200 mL/hr over 60 Minutes Intravenous Every M-W-F (Hemodialysis) 04/07/21 1140     04/07/21 1145  vancomycin (VANCOREADY) IVPB 1500 mg/300 mL        1,500 mg 150 mL/hr over 120 Minutes Intravenous To Hemodialysis 04/07/21 1140 04/07/21 1428   04/03/21 1130  ceFAZolin (ANCEF) IVPB 3g/100 mL premix  Status:  Discontinued        3 g 200 mL/hr over 30 Minutes Intravenous On call to O.R. 04/03/21 1008 04/03/21 1459   04/03/21 1010  ceFAZolin (ANCEF) 3-0.9 GM/100ML-% IVPB       Note to Pharmacy: Baird Lyons  : cabinet override      04/03/21 1010 04/03/21 2214   04/03/21 0915  vancomycin (VANCOREADY) IVPB 750 mg/150 mL        750 mg 150 mL/hr over 60 Minutes Intravenous  Once 04/03/21 0829 04/03/21 1622   03/29/21 2000  ceFEPIme (MAXIPIME) 2 g in sodium chloride 0.9 % 100 mL IVPB  Status:  Discontinued        2 g 200 mL/hr over 30 Minutes Intravenous Every M-W-F (2000) 03/29/21 0321 03/29/21 1541   03/29/21 1200  vancomycin (VANCOCIN) IVPB 1000 mg/200 mL premix  Status:  Discontinued        1,000 mg 200 mL/hr over 60 Minutes Intravenous Every M-W-F (Hemodialysis) 03/29/21 0321 04/07/21 1140   03/29/21 0000  vancomycin (VANCOCIN) 2,500 mg in sodium chloride 0.9 % 500 mL IVPB        2,500 mg 250 mL/hr over 120 Minutes Intravenous  Once 03/28/21 2346 03/29/21 0340   03/29/21 0000  ceFEPIme (MAXIPIME) 2 g in sodium chloride 0.9 % 100 mL IVPB        2 g 200 mL/hr over 30 Minutes Intravenous  Once 03/28/21 2346 03/29/21 0126       Objective: Vitals:   04/12/21 1100 04/12/21 1130 04/12/21 1200 04/12/21 1230  BP: (!) 114/55 122/61 (!) 115/50 (!) 111/51  Pulse: 83 75 77 83  Resp:      Temp:      TempSrc:      SpO2:      Weight:      Height:        Intake/Output Summary (Last 24 hours) at 04/12/2021 1626 Last data filed at 04/12/2021 0957 Gross per 24 hour  Intake 390 ml  Output 1390 ml  Net -1000 ml   Filed Weights   04/09/21 1258  04/09/21 1350 04/12/21 0946  Weight: 110.5 kg 110.5 kg 118.5 kg    Examination: General exam: Appears comfortable  HEENT: PERRLA, oral mucosa moist, no sclera icterus or  thrush Respiratory system: Clear to auscultation. Respiratory effort normal. Cardiovascular system: S1 & S2 heard, regular rate and rhythm Gastrointestinal system: Abdomen soft, non-tender, nondistended. Normal bowel sounds   Central nervous system: Alert and oriented. No focal neurological deficits. Extremities: No cyanosis, clubbing or edema- left AKA Skin: No rashes or ulcers Psychiatry:  Mood & affect appropriate.   Data Reviewed: I have personally reviewed following labs and imaging studies  CBC: Recent Labs  Lab 04/05/21 2217 04/06/21 1616 04/07/21 0032 04/09/21 0900 04/12/21 1337  WBC 11.1* 10.5 9.9 10.9* 9.3  NEUTROABS  --  8.0*  --   --   --   HGB 5.4* 6.8* 7.4* 8.3* 8.2*  HCT 16.4* 20.9* 22.6* 25.7* 26.0*  MCV 85.4 88.9 86.6 89.2 91.5  PLT 195 215 211 174 [id number]   Basic Metabolic Panel: Recent Labs  Lab 04/06/21 1616 04/07/21 0953 04/09/21 0900 04/12/21 1343  NA 128* 128* 128* 130*  K 4.2 4.5 3.9 4.3  CL 94* 94* 92* 95*  CO2 '25 22 25 24  '$ GLUCOSE 387* 281* 331* 311*  BUN 53* 58* 50* 73*  CREATININE 3.45* 3.30* 3.04* 3.55*  CALCIUM 8.3* 8.7* 8.6* 8.5*  PHOS  --  5.4*  --  4.6   GFR: Estimated Creatinine Clearance: 26.4 mL/min (A) (by C-G formula based on SCr of 3.55 mg/dL (H)). Liver Function Tests: Recent Labs  Lab 04/07/21 0953 04/12/21 1343  ALBUMIN 2.6* 2.4*   No results for input(s): LIPASE, AMYLASE in the last 168 hours. No results for input(s): AMMONIA in the last 168 hours. Coagulation Profile: No results for input(s): INR, PROTIME in the last 168 hours. Cardiac Enzymes: No results for input(s): CKTOTAL, CKMB, CKMBINDEX, TROPONINI in the last 168 hours. BNP (last 3 results) No results for input(s): PROBNP in the last 8760 hours. HbA1C: No results for input(s): HGBA1C in  the last 72 hours. CBG: Recent Labs  Lab 04/11/21 0732 04/11/21 1203 04/11/21 1649 04/11/21 2120 04/12/21 0744  GLUCAP 237* 343* 261* 167* 279*   Lipid Profile: No results for input(s): CHOL, HDL, LDLCALC, TRIG, CHOLHDL, LDLDIRECT in the last 72 hours. Thyroid Function Tests: No results for input(s): TSH, T4TOTAL, FREET4, T3FREE, THYROIDAB in the last 72 hours. Anemia Panel: No results for input(s): VITAMINB12, FOLATE, FERRITIN, TIBC, IRON, RETICCTPCT in the last 72 hours. Urine analysis:    Component Value Date/Time   COLORURINE AMBER (A) 03/30/2021 0239   APPEARANCEUR HAZY (A) 03/30/2021 0239   LABSPEC 1.018 03/30/2021 0239   PHURINE 5.0 03/30/2021 0239   GLUCOSEU NEGATIVE 03/30/2021 0239   HGBUR NEGATIVE 03/30/2021 0239   BILIRUBINUR NEGATIVE 03/30/2021 0239   KETONESUR NEGATIVE 03/30/2021 0239   PROTEINUR NEGATIVE 03/30/2021 0239   NITRITE NEGATIVE 03/30/2021 0239   LEUKOCYTESUR NEGATIVE 03/30/2021 0239   Sepsis Labs: '@LABRCNTIP'$ (procalcitonin:4,lacticidven:4) ) No results found for this or any previous visit (from the past 240 hour(s)).       Radiology Studies: No results found.    Scheduled Meds: . (feeding supplement) PROSource Plus  30 mL Oral BID BM  . sodium chloride   Intravenous Once  . sodium chloride   Intravenous Once  . sodium chloride   Intravenous Once  . acetaminophen  650 mg Oral QID  . amLODipine  5 mg Oral QHS  . atorvastatin  40 mg Oral QHS  . calcium acetate  1,334 mg Oral TID with meals  . carvedilol  12.5 mg Oral BID WC  . Chlorhexidine Gluconate Cloth  6 each Topical Q0600  .  darbepoetin (ARANESP) injection - DIALYSIS  150 mcg Intravenous Q Fri-HD  . docusate sodium  100 mg Oral Daily  . gabapentin  100 mg Oral Once  . gabapentin  300 mg Oral BID  . insulin aspart  0-15 Units Subcutaneous TID WC  . insulin aspart  0-5 Units Subcutaneous QHS  . insulin regular human CONCENTRATED  55 Units Subcutaneous Q supper   And  . insulin  regular human CONCENTRATED  120 Units Subcutaneous Q breakfast  . mouth rinse  15 mL Mouth Rinse BID  . nortriptyline  20 mg Oral QHS  . pantoprazole  40 mg Oral Daily  . polyethylene glycol  17 g Oral BID  . senna  1 tablet Oral Daily  . sertraline  100 mg Oral Daily   Continuous Infusions: . magnesium sulfate bolus IVPB    . vancomycin 1,000 mg (04/12/21 1218)     LOS: 14 days      Debbe Odea, MD [facility] Pager: [email or web address] 04/12/2021, 4:26 PM

## 2021-04-13 DIAGNOSIS — D509 Iron deficiency anemia, unspecified: Secondary | ICD-10-CM | POA: Insufficient documentation

## 2021-04-13 LAB — GLUCOSE, CAPILLARY
Glucose-Capillary: 311 mg/dL — ABNORMAL HIGH (ref 70–99)
Glucose-Capillary: 303 mg/dL — ABNORMAL HIGH (ref 70–99)
Glucose-Capillary: 158 mg/dL — ABNORMAL HIGH (ref 70–99)
Glucose-Capillary: 164 mg/dL — ABNORMAL HIGH (ref 70–99)
Glucose-Capillary: 76 mg/dL (ref 70–99)
Glucose-Capillary: 99 mg/dL (ref 70–99)

## 2021-04-13 MED ORDER — INSULIN REGULAR HUMAN (CONC) 500 UNIT/ML ~~LOC~~ SOPN
70.0000 [IU] | PEN_INJECTOR | Freq: Every day | SUBCUTANEOUS | Status: DC
  Administered 2021-04-13: 70 [IU] via SUBCUTANEOUS

## 2021-04-13 MED ORDER — INSULIN REGULAR HUMAN (CONC) 500 UNIT/ML ~~LOC~~ SOPN
130.0000 [IU] | PEN_INJECTOR | Freq: Every day | SUBCUTANEOUS | Status: DC
  Administered 2021-04-14: 130 [IU] via SUBCUTANEOUS

## 2021-04-13 NOTE — Progress Notes (Signed)
Occupational Therapy Treatment Patient Details Name: Jeffrey [name] MRN: [id number] DOB: 07-Oct-1961 Today's Date: 04/13/2021    History of present illness 60 y.o. male presents to [facility] ED on 03/28/2021 with reports of fever, chills, nausea/vomiting, confusion. Pt reports significant L knee pain, imaging revealing massive effusion. 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 PMH includes ESRD, CHF, COPD, DMII, HTN, PTSD.   OT comments  Pt progressing towards OT goals, received in recliner and reporting desire to return to bed. Pt tends to determine course of session, so focus was on transfer training. Pt overall Min A for combination of scoot/squat pivot transfer back to bed using bed rails. However, pt with impulsive movements but decreased receptiveness of OT cues for safety. Plan to educate on LB ADL strategies during next session as appropriate. DC recommendations remain appropriate.    Follow Up Recommendations  SNF    Equipment Recommendations  3 in 1 bedside commode;Tub/shower bench;Wheelchair (measurements OT);Wheelchair cushion (measurements OT) (bariatric DME)    Recommendations for Other Services      Precautions / Restrictions Precautions Precautions: Fall Restrictions Weight Bearing Restrictions: Yes LLE Weight Bearing: Non weight bearing Other Position/Activity Restrictions: L AKA       Mobility Bed Mobility Overal bed mobility: Needs Assistance Bed Mobility: Sit to Supine     Supine to sit: Min guard     General bed mobility comments: min guard for safety as pt impulsively laid back horizontally in bed after transfer. increased time to get hips/LE back in bed straight    Transfers Overall transfer level: Needs assistance Equipment used: 1 person hand held assist Transfers: Squat Pivot Transfers;Lateral/Scoot Transfers           General transfer comment: Min A for combination lateral scoot/squat pivot transfer to bed with pt  using bedrails. Pt refused gait belt/therapist assist and impulsively began standing attempts. Therapists assist to stabilize chair and prevent gap between surfaces.    Balance Overall balance assessment: Needs assistance Sitting-balance support: No upper extremity supported;Feet supported Sitting balance-Leahy Scale: Good                                     ADL either performed or assessed with clinical judgement   ADL Overall ADL's : Needs assistance/impaired                                       General ADL Comments: Session focused on transfer training back to bed as pt requested due to being in chair all day.     Vision   Vision Assessment?: No apparent visual deficits   Perception     Praxis      Cognition Arousal/Alertness: Awake/alert Behavior During Therapy: Flat affect Overall Cognitive Status: Impaired/Different from baseline Area of Impairment: Safety/judgement;Awareness;Problem solving                         Safety/Judgement: Decreased awareness of safety;Decreased awareness of deficits Awareness: Emergent Problem Solving: Requires verbal cues;Requires tactile cues General Comments: easily agitated at times, tends to dictate course of session. Decreased safety awareness, requiring cues and physical assist to decrease fall risk        Exercises     Shoulder Instructions       General Comments  Pertinent Vitals/ Pain       Pain Assessment: Faces Faces Pain Scale: Hurts a little bit Pain Location: L residual limb Pain Descriptors / Indicators: Grimacing Pain Intervention(s): Monitored during session;Premedicated before session  Home Living                                          Prior Functioning/Environment              Frequency  Min 2X/week        Progress Toward Goals  OT Goals(current goals can now be found in the care plan section)  Progress towards OT goals:  Progressing toward goals  Acute Rehab OT Goals Patient Stated Goal: be able to walk again OT Goal Formulation: With patient Time For Goal Achievement: 04/20/21 Potential to Achieve Goals: Good ADL Goals Pt Will Perform Grooming: with min guard assist;standing Pt Will Perform Lower Body Bathing: with min assist;sitting/lateral leans;sit to/from stand Pt Will Perform Lower Body Dressing: with min assist;sitting/lateral leans;sit to/from stand Pt Will Transfer to Toilet: with min assist;squat pivot transfer;stand pivot transfer;bedside commode Pt Will Perform Toileting - Clothing Manipulation and hygiene: with min assist;sitting/lateral leans;sit to/from stand  Plan Discharge plan remains appropriate    Co-evaluation                 AM-PAC OT "6 Clicks" Daily Activity     Outcome Measure   Help from another person eating meals?: None Help from another person taking care of personal grooming?: A Little Help from another person toileting, which includes using toliet, bedpan, or urinal?: A Lot Help from another person bathing (including washing, rinsing, drying)?: A Lot Help from another person to put on and taking off regular upper body clothing?: A Little Help from another person to put on and taking off regular lower body clothing?: A Lot 6 Click Score: 16    End of Session    OT Visit Diagnosis: Unsteadiness on feet (R26.81);Other abnormalities of gait and mobility (R26.89);Muscle weakness (generalized) (M62.81);Pain Pain - Right/Left: Left Pain - part of body: Leg   Activity Tolerance Patient tolerated treatment well   Patient Left in bed;with call bell/phone within reach;with bed alarm set   Nurse Communication Mobility status        Time: [id number] OT Time Calculation (min): 18 min  Charges: OT General Charges $OT Visit: 1 Visit OT Treatments $Therapeutic Activity: 8-22 mins  Malachy Chamber, OTR/L Acute Rehab Services Office: [phone]   Layla Maw 04/13/2021, 3:08 PM

## 2021-04-13 NOTE — Care Management Important Message (Signed)
Important Message  Patient Details  Name: Jeffrey [name] MRN: [id number] Date of Birth: May 29, 1961   Medicare Important Message Given:  Yes - Important Message mailed due to current National Emergency   Verbal consent obtained due to current National Emergency  Relationship to patient: Self Contact Name: Trindon Call Date: 04/13/21  Time: 1052 Phone: [phone] Outcome: Spoke with contact Important Message mailed to: Patient address on file    Delorse Lek 04/13/2021, 10:52 AM

## 2021-04-13 NOTE — Progress Notes (Signed)
Inpatient Rehab Admissions Coordinator Note:   Per family request, pt was screened for CIR candidacy by Shann Medal, PT, DPT.  Based on current payor trends, [facility] Medicare will not approve CIR for diagnosis of AKA, nor will they approve CIR with therapy recommendations of SNF, which I feel are appropriate.  Would not recommend a CIR consult at this time.  Please contact me with questions.   Shann Medal, PT, DPT [phone] 04/13/21 11:43 AM

## 2021-04-13 NOTE — Progress Notes (Signed)
[facility] Progress Note   Subjective:   Patient seen and examined at bedside.  His wife had decided she would prefer for the patient to go to inpatient rehab but he has been deemed not a candidate.  Denies CP, SOB, edema, orthopnea, n/v/d and abdominal pain.  Tolerating dialysis well using RU AVG.  No specific complaints.  Objective Vitals:   04/13/21 0009 04/13/21 0353 04/13/21 0822 04/13/21 1232  BP: 120/60 (!) 117/59 (!) 114/55 121/64  Pulse: 80 79 64 77  Resp: '19 17 18 18  '$ Temp: 98.7 F (37.1 C) 98 F (36.7 C) 98.9 F (37.2 C) 97.8 F (36.6 C)  TempSrc: Oral Oral Oral Oral  SpO2: 96% 97%    Weight:      Height:       Physical Exam General: well appearing male in NAD Heart:RRR, no mrg Lungs:CTAB, nml WOB on RA Abdomen:soft, NTND Extremities:L AKA, no LE edema Dialysis Access: RU AVG +b/t   Filed Weights   04/09/21 1350 04/12/21 0946 04/12/21 1320  Weight: 110.5 kg 118.5 kg 117.5 kg    Intake/Output Summary (Last 24 hours) at 04/13/2021 1430 Last data filed at 04/13/2021 1100 Gross per 24 hour  Intake --  Output 575 ml  Net -575 ml    Additional Objective Labs: Basic Metabolic Panel: Recent Labs  Lab 04/07/21 0953 04/09/21 0900 04/12/21 1343  NA 128* 128* 130*  K 4.5 3.9 4.3  CL 94* 92* 95*  CO2 '22 25 24  '$ GLUCOSE 281* 331* 311*  BUN 58* 50* 73*  CREATININE 3.30* 3.04* 3.55*  CALCIUM 8.7* 8.6* 8.5*  PHOS 5.4*  --  4.6   Liver Function Tests: Recent Labs  Lab 04/07/21 0953 04/12/21 1343  ALBUMIN 2.6* 2.4*   CBC: Recent Labs  Lab 04/06/21 1616 04/07/21 0032 04/09/21 0900 04/12/21 1337  WBC 10.5 9.9 10.9* 9.3  NEUTROABS 8.0*  --   --   --   HGB 6.8* 7.4* 8.3* 8.2*  HCT 20.9* 22.6* 25.7* 26.0*  MCV 88.9 86.6 89.2 91.5  PLT 215 211 174 163   CBG: Recent Labs  Lab 04/12/21 0744 04/12/21 1726 04/12/21 2122 04/13/21 0828 04/13/21 1212  GLUCAP 279* 163* 195* 311* 303*    Medications: . magnesium sulfate bolus IVPB     . vancomycin 1,000 mg (04/12/21 1218)   . (feeding supplement) PROSource Plus  30 mL Oral BID BM  . sodium chloride   Intravenous Once  . sodium chloride   Intravenous Once  . sodium chloride   Intravenous Once  . acetaminophen  650 mg Oral QID  . amLODipine  5 mg Oral QHS  . atorvastatin  40 mg Oral QHS  . calcium acetate  1,334 mg Oral TID with meals  . carvedilol  12.5 mg Oral BID WC  . Chlorhexidine Gluconate Cloth  6 each Topical Q0600  . darbepoetin (ARANESP) injection - DIALYSIS  150 mcg Intravenous Q Fri-HD  . docusate sodium  100 mg Oral Daily  . gabapentin  300 mg Oral BID  . insulin aspart  0-15 Units Subcutaneous TID WC  . insulin aspart  0-5 Units Subcutaneous QHS  . insulin regular human CONCENTRATED  130 Units Subcutaneous Q breakfast   And  . insulin regular human CONCENTRATED  65 Units Subcutaneous Q supper  . mouth rinse  15 mL Mouth Rinse BID  . nortriptyline  20 mg Oral QHS  . pantoprazole  40 mg Oral Daily  . polyethylene glycol  17  g Oral BID  . senna  1 tablet Oral Daily  . sertraline  100 mg Oral Daily    Dialysis Orders: [facility] MWF  4h 121kg 3K/2.5 bath RUE AVG/ R IJ TDC Hep 4500+ 2570md -Mircera 100 ug q2 last 4/27  Assessment/Plan: 1. MRSA bacteremia: BCx 5/1 +, repeat BCx 5/3 negative. On Vancomycinthrough 6/18 with HD.TDC removed 5/3, no need to replace as AVG in use. 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 2. ESRD:on HD MWF-HD yesterday.  Will be changing to TTS schedule at new dialysis unit per update from renal navigator this afternoon.  Patient labs have been stable and respiratory status stable, ok to resume dialysis on Thursday 5/19 on new TTS schedule.  3. [facility]/14 that showed patent AVG.TDC removed per IR 5/3, AVG in  use. 4. Anemiaof ESRD/ABLA :Hgb drop post op to 7.4 S/p 2 units prbcs 5/9, 2 units 5/10, and1 unit 5/11.Hgb now 8.2.Trend HH as patient allows.Last ESA dose 5/13 5. HTN/ volume:BP stable, continue home meds.  Does not appear grossly volume overloaded.  UF as tolerated.  Will need lower dry on d/c after amputation ~115-117kg.  6. PTSD:Continue home meds. 7. COPD:Per primary. 8. DM2: Per primary, on insulin. 9. MBD:Ca ok,Phosnow at goal-Phoslorecently^'d to 2/meals.No VDRA. 10. Nutrition: Alb low,continuesupplements. 11. [facility] as dialysis outpatient dialysis schedule confirmed.    LJen Mow PA-C [facility] 04/13/2021,2:30 PM  LOS: 15 days

## 2021-04-13 NOTE — TOC Progression Note (Addendum)
Transition of Care [facility]) - Progression Note    Patient Details  Name: Jeffrey [name] MRN: [id number] Date of Birth: 1961-03-02  Transition of Care [facility]) CM/SW [place], LCSW Phone Number: 04/13/2021, 8:49 AM  Clinical Narrative:    CSW in communication with [facility] regarding when they can accept patient; they are trying to get him in tomorrow. CSW submitted updated clinicals to [facility] for review. Renal Navigator working with [facility] for a chair time. Patient requesting CIR consult; MD aware.    Expected Discharge Plan: [place] Barriers to Discharge: Insurance Authorization,Continued Medical Work up  Expected Discharge Plan and Services Expected Discharge Plan: [place] In-house Referral: Clinical Social Work   Post Acute Care Choice: [place] Living arrangements for the past 2 months: Single Family Home Expected Discharge Date: 04/10/21                                     Social Determinants of Health (SDOH) Interventions    Readmission Risk Interventions Readmission Risk Prevention Plan 04/07/2021  Transportation Screening Complete  Medication Review [employer]) Referral to Pharmacy  PCP or Specialist appointment within 3-5 days of discharge Complete  HRI or Home Care Consult Complete  SW Recovery Care/Counseling Consult Complete  Palliative Care Screening Not [place] Complete

## 2021-04-13 NOTE — Progress Notes (Signed)
PROGRESS NOTE    Jeffrey [name]   [id number]  DOB: [birth year]  DOA: 03/28/2021 PCP: Merryl Hacker, No   Brief Narrative:  Jeffrey [name] 60 year old with past medical history significant for ESRD,  diabetes presents to the ED complaining of fever.  Accompanied by vomiting, diaphoresis and mild confusion.  He has been having left knee pain.  Patient has a history of chronic knee  pain and swelling left knee, prior imagine at [facility].  Patient has a history of knee replacement/hardware bilaterally.   In the ED left knee imaging showed massive knee joint effusion, Temp 103 degrees, WBC 12K. Patient was started empirically on IV antibiotics.  Patient was evaluated by orthopedic surgery, who is recommending AKA.   He was also diagnosed with MRSA bacteremia & underwent hemodialysis catheter removal.  Currently has RUE AVG (01/2021).  ID and orthopedic consulted. Underwent Left AKA by Dr Lucia Gaskins  on 5/7. Patient decline TEE. ID recommends IV Vancomycin through June 18 with HD.   Likely Acute blood loss anemia, post surgery. Patient refused labs 04/04/2021. Labs  On 5/09 showed Hb 4.5.     Subjective: No new complaints but continues to state that pain in left stump has been severe at times. No other complaints. 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- recommendations were to perform an AKA vs transfer to a tertiary care facility- patient decided on AKA - left AKA 5/7 - 5/13> Vac removed by Dr Lucia Gaskins- he will need to remove sutures in 4 wks - cont Amitriptyline, APAP QID, Gabapentin 300 mg BID (renal dosed) -  Increase Oxycodone to 10 mg Q4 PRN in attempt to stop Dilaudid- he is still not using regularly Q 4 hrs  and prefers Dilaudid   Hyponatremia - follow with dialysis  Anemia - drop noted from 8.8 on 5/7 to 4.5 on 5/9- no blood draw on 5/8  - due to ESRD, acute infection and possibly acute blood loss from AKA - 3 U PRBC required for HB of 4.5 which has improved to 7.4 by 5/11 - 5/12> unable to get blood after multiple stick and he did not allow further blood draws - 5/13 Hb 8.3  DM 2 - A1c 8.2  - sugars ranging from 160- 300s and very erratic -5/16>  increased AM Novolog to 120 and PM to 65 - increase PM Novolog to 70 U today as AM sugars in 300s - per RN, he is getting food from outside the hospital   HTN - cont Coreg and Amlodipine     ESRD (end stage renal disease) -cont dialysis per nephrology- plan to reduce dry weight due to AKA - HD cath removed  Hypersomnolence - resolved after holding Gabapentin, Nortriptyline, Trazodone, Valium - following while increasing narcotics   Morbid obesity   Body mass index is 43.11 kg/m.    Time spent in minutes: 35 DVT prophylaxis: SCD's Start: 04/04/21 0402 SCDs Start: 03/29/21 0842 Code Status: Full code Family Communication:  Level of Care: Level of care: Med-Surg Disposition Plan:  Status is: Inpatient  Remains inpatient appropriate because:Inpatient level of care appropriate due to severity of illness   Dispo: The patient is  from: Home              Anticipated d/c is to: SNF              Patient currently is medically stable to d/c.   Difficult to place patient No      Consultants:   ID  Nephrology  Orthopedic surgery Procedures:   Left AKA Antimicrobials:  Anti-infectives (From admission, onward)   Start     Dose/Rate Route Frequency Ordered Stop   04/12/21 0000  vancomycin (VANCOCIN) 1-5 GM/200ML-% SOLN        1,000 mg Intravenous Every M-W-F (Hemodialysis) 04/10/21 1519     04/09/21 1204  vancomycin (VANCOCIN) 1-5 GM/200ML-% IVPB       Note to Pharmacy: Wallace Cullens   : cabinet override       04/09/21 1204 04/09/21 1635   04/09/21 1200  vancomycin (VANCOCIN) IVPB 1000 mg/200 mL premix        1,000 mg 200 mL/hr over 60 Minutes Intravenous Every M-W-F (Hemodialysis) 04/07/21 1140     04/07/21 1145  vancomycin (VANCOREADY) IVPB 1500 mg/300 mL        1,500 mg 150 mL/hr over 120 Minutes Intravenous To Hemodialysis 04/07/21 1140 04/07/21 1428   04/03/21 1130  ceFAZolin (ANCEF) IVPB 3g/100 mL premix  Status:  Discontinued        3 g 200 mL/hr over 30 Minutes Intravenous On call to O.R. 04/03/21 1008 04/03/21 1459   04/03/21 1010  ceFAZolin (ANCEF) 3-0.9 GM/100ML-% IVPB       Note to Pharmacy: Baird Lyons  : cabinet override      04/03/21 1010 04/03/21 2214   04/03/21 0915  vancomycin (VANCOREADY) IVPB 750 mg/150 mL        750 mg 150 mL/hr over 60 Minutes Intravenous  Once 04/03/21 0829 04/03/21 1622   03/29/21 2000  ceFEPIme (MAXIPIME) 2 g in sodium chloride 0.9 % 100 mL IVPB  Status:  Discontinued        2 g 200 mL/hr over 30 Minutes Intravenous Every M-W-F (2000) 03/29/21 0321 03/29/21 1541   03/29/21 1200  vancomycin (VANCOCIN) IVPB 1000 mg/200 mL premix  Status:  Discontinued        1,000 mg 200 mL/hr over 60 Minutes Intravenous Every M-W-F (Hemodialysis) 03/29/21 0321 04/07/21 1140   03/29/21 0000  vancomycin (VANCOCIN) 2,500 mg in sodium chloride 0.9 % 500 mL IVPB        2,500 mg 250 mL/hr over 120 Minutes Intravenous  Once 03/28/21 2346 03/29/21 0340   03/29/21 0000  ceFEPIme (MAXIPIME) 2 g in sodium chloride 0.9 % 100 mL IVPB        2 g 200 mL/hr over 30 Minutes Intravenous  Once 03/28/21 2346 03/29/21 0126       Objective: Vitals:   04/13/21 0009 04/13/21 0353 04/13/21 0822 04/13/21 1232  BP: 120/60 (!) 117/59 (!) 114/55 121/64  Pulse: 80 79 64 77  Resp: '19 17 18 18  '$ Temp: 98.7 F (37.1 C) 98 F (36.7 C) 98.9 F (37.2 C) 97.8 F (36.6 C)  TempSrc: Oral Oral Oral Oral  SpO2: 96% 97%    Weight:      Height:        Intake/Output Summary (Last 24  hours) at 04/13/2021 1537 Last data filed at 04/13/2021 1100 Gross per 24 hour  Intake --  Output 575 ml  Net -575 ml   Filed Weights   04/09/21 1350 04/12/21 0946 04/12/21 1320  Weight: 110.5 kg 118.5 kg 117.5 kg    Examination: General exam: Appears comfortable  HEENT: PERRLA, oral mucosa moist, no sclera icterus or thrush Respiratory system: Clear to auscultation. Respiratory effort normal. Cardiovascular system: S1 & S2 heard, regular rate and rhythm Gastrointestinal system: Abdomen soft, non-tender, nondistended. Normal bowel sounds   Central nervous system: Alert and oriented. No focal neurological deficits. Extremities: No cyanosis, clubbing or edema- left AKA Skin: No rashes or ulcers Psychiatry:  Mood & affect appropriate.   Data Reviewed: I have personally reviewed following labs and imaging studies  CBC: Recent Labs  Lab 04/06/21 1616 04/07/21 0032 04/09/21 0900 04/12/21 1337  WBC 10.5 9.9 10.9* 9.3  NEUTROABS 8.0*  --   --   --   HGB 6.8* 7.4* 8.3* 8.2*  HCT 20.9* 22.6* 25.7* 26.0*  MCV 88.9 86.6 89.2 91.5  PLT 215 211 174 [id number]   Basic Metabolic Panel: Recent Labs  Lab 04/06/21 1616 04/07/21 0953 04/09/21 0900 04/12/21 1343  NA 128* 128* 128* 130*  K 4.2 4.5 3.9 4.3  CL 94* 94* 92* 95*  CO2 '25 22 25 24  '$ GLUCOSE 387* 281* 331* 311*  BUN 53* 58* 50* 73*  CREATININE 3.45* 3.30* 3.04* 3.55*  CALCIUM 8.3* 8.7* 8.6* 8.5*  PHOS  --  5.4*  --  4.6   GFR: Estimated Creatinine Clearance: 26.3 mL/min (A) (by C-G formula based on SCr of 3.55 mg/dL (H)). Liver Function Tests: Recent Labs  Lab 04/07/21 0953 04/12/21 1343  ALBUMIN 2.6* 2.4*   No results for input(s): LIPASE, AMYLASE in the last 168 hours. No results for input(s): AMMONIA in the last 168 hours. Coagulation Profile: No results for input(s): INR, PROTIME in the last 168 hours. Cardiac Enzymes: No results for input(s): CKTOTAL, CKMB, CKMBINDEX, TROPONINI in the last 168 hours. BNP (last 3  results) No results for input(s): PROBNP in the last 8760 hours. HbA1C: No results for input(s): HGBA1C in the last 72 hours. CBG: Recent Labs  Lab 04/12/21 0744 04/12/21 1726 04/12/21 2122 04/13/21 0828 04/13/21 1212  GLUCAP 279* 163* 195* 311* 303*   Lipid Profile: No results for input(s): CHOL, HDL, LDLCALC, TRIG, CHOLHDL, LDLDIRECT in the last 72 hours. Thyroid Function Tests: No results for input(s): TSH, T4TOTAL, FREET4, T3FREE, THYROIDAB in the last 72 hours. Anemia Panel: No results for input(s): VITAMINB12, FOLATE, FERRITIN, TIBC, IRON, RETICCTPCT in the last 72 hours. Urine analysis:    Component Value Date/Time   COLORURINE AMBER (A) 03/30/2021 0239   APPEARANCEUR HAZY (A) 03/30/2021 0239   LABSPEC 1.018 03/30/2021 0239   PHURINE 5.0 03/30/2021 0239   GLUCOSEU NEGATIVE 03/30/2021 0239   HGBUR NEGATIVE 03/30/2021 0239   BILIRUBINUR NEGATIVE 03/30/2021 0239   KETONESUR NEGATIVE 03/30/2021 0239   PROTEINUR NEGATIVE 03/30/2021 0239   NITRITE NEGATIVE 03/30/2021 0239   LEUKOCYTESUR NEGATIVE 03/30/2021 0239   Sepsis Labs: '@LABRCNTIP'$ (procalcitonin:4,lacticidven:4) ) No results found for this or any previous visit (from the past 240 hour(s)).       Radiology Studies: No results found.    Scheduled Meds: . (feeding supplement) PROSource Plus  30 mL Oral BID BM  . sodium chloride   Intravenous Once  . sodium chloride   Intravenous Once  . sodium chloride   Intravenous Once  . acetaminophen  650 mg Oral QID  . amLODipine  5 mg Oral QHS  . atorvastatin  40 mg Oral QHS  . calcium acetate  1,334 mg Oral TID with meals  . carvedilol  12.5 mg Oral BID WC  . Chlorhexidine Gluconate Cloth  6 each Topical Q0600  . darbepoetin (ARANESP) injection - DIALYSIS  150 mcg Intravenous Q Fri-HD  . docusate sodium  100 mg Oral Daily  . gabapentin  300 mg Oral BID  . insulin aspart  0-15 Units Subcutaneous TID WC  . insulin aspart  0-5 Units Subcutaneous QHS  . insulin  regular human CONCENTRATED  130 Units Subcutaneous Q breakfast   And  . insulin regular human CONCENTRATED  65 Units Subcutaneous Q supper  . mouth rinse  15 mL Mouth Rinse BID  . nortriptyline  20 mg Oral QHS  . pantoprazole  40 mg Oral Daily  . polyethylene glycol  17 g Oral BID  . senna  1 tablet Oral Daily  . sertraline  100 mg Oral Daily   Continuous Infusions: . magnesium sulfate bolus IVPB    . vancomycin 1,000 mg (04/12/21 1218)     LOS: 15 days      Debbe Odea, MD [facility] Pager: [email or web address] 04/13/2021, 3:37 PM

## 2021-04-13 NOTE — TOC Progression Note (Addendum)
Transition of Care [facility]) - Progression Note    Patient Details  Name: Jeffrey [name] MRN: [id number] Date of Birth: 02-05-1961  Transition of Care [facility]) CM/SW [place], LCSW Phone Number: 04/13/2021, 4:09 PM  Clinical Narrative:    CSW updated patient on dc plan to [facility]. Insurance approval received: [id number], effective 04/14/21-04/16/21. OP Dialysis chair secured and schedule sent to [facility] (TTS 6:20am at [facility]).   CSW contacted [facility] to schedule transport, however, they are unable to do so without up front payment. CSW contacted [facility] to see if they can arrange it; they will call CSW back.   UPDATE: [facility] unable to find a vendor. CSW contacted [facility]; they could transport patient but do not have availability for the next two days. CSW contacted [facility]; they do not require up front payment and are able to pick patient up at 10am on Wednesday.   Expected Discharge Plan: [place] Barriers to Discharge: Insurance Authorization,Continued Medical Work up  Expected Discharge Plan and Services Expected Discharge Plan: [place] In-house Referral: Clinical Social Work   Post Acute Care Choice: [place] Living arrangements for the past 2 months: Single Family Home Expected Discharge Date: 04/10/21                                     Social Determinants of Health (SDOH) Interventions    Readmission Risk Interventions Readmission Risk Prevention Plan 04/07/2021  Transportation Screening Complete  Medication Review Press photographer) Referral to Pharmacy  PCP or Specialist appointment within 3-5 days of discharge Complete  HRI or Home Care Consult Complete  SW Recovery Care/Counseling Consult Complete  Palliative Care Screening Not [place] Complete

## 2021-04-13 NOTE — Plan of Care (Signed)

## 2021-04-13 NOTE — Progress Notes (Signed)
Renal Navigator requests update on transient outpatient seat schedule at [facility], but still does not have schedule. Navigator will follow up again this afternoon as it looks like patient can be admitted to SNF tomorrow per CSW.  Alphonzo Cruise, [place] Renal Navigator [phone]

## 2021-04-13 NOTE — Progress Notes (Signed)
Patient has been accepted at [facility] on a TTS schedule with a seat time of 6:20am. He needs to arrive at 6:00am. Per TOC CSW/N. Rayyan, he is scheduled for discharge tomorrow. Therefore, he will NOT have HD here tomorrow [name] schedule) and can discharge whenever he is ready. His next HD treatment will be Thursday, 04/15/21 at his new outpatient clinic to get on new TTS schedule. Navigator has updated CSW and Renal PA (in agreement with scheduling) and called patient to inform. He is also in agreement and thanked Renal Navigator.   Alphonzo Cruise, [place] Renal Navigator [phone]

## 2021-04-14 DIAGNOSIS — R6520 Severe sepsis without septic shock: Secondary | ICD-10-CM

## 2021-04-14 LAB — SARS CORONAVIRUS 2 (TAT 6-24 HRS): SARS Coronavirus 2: NEGATIVE

## 2021-04-14 MED ORDER — OXYCODONE HCL 5 MG PO TABS: 5.0000 mg | ORAL_TABLET | ORAL | 0 refills | Status: DC | PRN

## 2021-04-14 MED ORDER — ACETAMINOPHEN 325 MG PO TABS: 650.0000 mg | ORAL_TABLET | Freq: Four times a day (QID) | ORAL | 0 refills | Status: DC | PRN

## 2021-04-14 NOTE — Discharge Summary (Signed)
Physician Discharge Summary  Jeffrey [name] [id number] DOB: Apr 20, 1961 DOA: 03/28/2021  PCP: Pcp, No  Admit date: 03/28/2021 Discharge date: 04/14/2021  Admitted From: Home Disposition: SNF  Recommendations for Outpatient Follow-up:  1. Follow up with PCP in 1-2 weeks 2. Follow up with Nephrology in the outpatient Setting and c/w Dialysis TThSat 3. Follow up with Orthopedic Surgery within 1-2 weeks 4. Follow up with ID as an outpatient within 4 weeks and continue IV Vancomycin through 05/15/21 5. Please obtain CMP/CBC, Mag, Phos in one week 6. Please follow up on the following pending results:  Home Health: No  Equipment/Devices: 3in1 Bedside Commode, Immunologist, Arts administrator with [employer]  Discharge Condition: Stable   CODE STATUS: FULL CODE Diet recommendation: Renal Carb Modified Diet with 1200 mL Fluid Restriction   Brief/Interim Summary: The patient Jeffrey [name] is a 60 year old with a past medical history significant for but not limited to ESRD previously on hemodialysis Monday Wednesday Friday, diabetes mellitus type 2 as well as other comorbidities who presented to the ED complaining of a fever.  The fever was accompanied by vomiting, diaphoresis mild confusion.  He had noted that he had been having left knee pain.  He has a history of chronic knee pain and swelling left knee with prior imaging at [facility].  He is also had a history of bilateral knee replacement/hardware implantation.  In the ED left knee imaging showed a massive knee joint effusion with a temperature of 103 and a WBC of 12.  He is empirically started on IV antibiotics.  Is evaluated by orthopedic surgery who recommended AKA and subsequent work-up revealed that he had an MRSA bacteremia.  He underwent hemodialysis catheter removal and currently has a right upper extremity AVG that was placed in 01/2021.  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 Lucia Gaskins on 04/03/2021.  He declined his TEE for further work-up.  ID evaluated and recommended IV vancomycin through June 18 with hemodialysis.  Subsequently likely had acute blood loss anemia postsurgery and he reviewed his labs on 04/04/2021 and on 04/05/2021 his hemoglobin was 4.5.  He received 3 units of PRBCs and his hemoglobin further improved to 7.4 on 04/07/2021 and then repeat hemoglobin on 04/09/2021 was 8.3 and has been stable the last few days and was 8.2 on 04/12/2021 and not repeated today.  Repeat COVID testing was negative the patient was deemed medically stable to be discharged to skilled nursing facility with outpatient dialysis and now has dialysis timings have changed to Tuesday Thursday Saturday.  He will need to follow-up with infectious diseases, orthopedic surgery, as well as nephrology in outpatient setting as he will continue his rehabilitation efforts at SNF.  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   Infection of prosthetic left knee joint (HCC)   Sepsis (HCC)   Severe anemia  Severe Sepsis from MRSA bacteremia in the setting of Infected Prostethetic Left Knee Joint s/p AKA now -Sepsis physiology has resolved he is stable -Infectious diseases recommending continuing IV vancomycin with hemodialysis till 05/12/2021 and follow-up with  ID -He has declined further work-up with TEE  Infected Prostetic Left Knee Joint -Per Orthopedic Surgery -He has a chronically infected total left knee arthroplasty, left no recommendations were to perform an AKA versus transfer to [facility] facility and patient decided on an AKA -Underwent AKA on 04/04/2019 -On 04/09/2021 wound VAC removed by Dr. Lucia Gaskins -He will need to follow-up with orthopedic surgery in 4 weeks for suture removal -We will continue with amitriptyline, acetaminophen every 6 as needed, gabapentin 300 mg daily -We will continue with oxycodone 10 mg every 4 as needed for discharge and have patient follow-up with orthopedic surgery for further Pain Management   Hyponatremia -His sodium was last check was 130 -Continue monitor and trend likely to be corrected with further dialysis treatments -Repeat CMP within 1 week  ESRD now on dialysis Tuesday Thursday Saturday -Patient's last BUN/creatinine went from 50/3.04 and was 73/3.55 -Next dialysis session scheduled for Thursday 04/15/2021 -Avoid further nephrotoxic medications, contrast dyes, hypotension and renally adjust medications -Continue with calcium acetate 1334 mg p.o. 3 times daily with meals -Follow-up with nephrology in the outpatient setting within 1 to 2 weeks   Acute blood loss anemia superimposed on anemia of chronic kidney disease -Hemoglobin dropped to 4.5 after his surgical intervention -He is typed and screened and transfused 3 units of PRBCs -Hemoglobin/hematocrit is now relatively stable and was 8.2/26.0 on last check on 04/12/2021 We will continue to monitor for signs and symptoms of bleeding; currently no overt bleeding noted -Continue with darbepoetin alfa 150 mcg IV with dialysis in the outpatient setting -Repeat CBC within 1 week   Uncontrolled Diabetes Mellitus Type 2 -Continue with Humulin R 130 units subcu daily with breakfast and 70 units subcu daily with supper while hospitalized along with a moderate NovoLog sliding scale insulin before meals and at bedtime; Resume Home Regimen at D/C -Continue monitor blood sugars per protocol -CBGs have been ranging from  Hyperlipidemia -Continue with Atorvastatin 40 mg p.o.  nightly  Hypertension -Continue with amlodipine 5 mg p.o. daily and with carvedilol 12.5 mg p.o. twice daily -Continue with hydralazine 5 mg IV q. 20 minutes for high blood pressure for 2 doses -Continue to monitor blood pressures per protocol -Last blood pressure reading was  GERD -Continue with Pantoprazole 40 mg p.o. daily  Hypersomnolence -Improved -Initially Held Gabapentin, Nortriptyline, Trazodone, and Valium and now resumed -Continue to Monitor while on Narcotics  Morbid Obesity -Complicates overall prognosis and care -Estimated body mass index is 43.11 kg/m as calculated from the following:   Height as of this encounter: '5\' 5"'$  (1.651 m).   Weight as of this encounter: 117.5 kg. -Weight Loss and Dietary Counseling given   Discharge Instructions  Discharge Instructions    Call MD for:  difficulty breathing, headache or visual disturbances   Complete by: As directed    Call MD for:  extreme fatigue   Complete by: As directed    Call MD for:  hives   Complete by: As directed    Call MD for:  persistant dizziness or light-headedness   Complete by: As directed    Call MD for:  persistant nausea and vomiting   Complete by: As directed    Call MD for:  redness, tenderness, or signs of infection (pain, swelling, redness, odor or green/yellow discharge around incision site)   Complete by: As directed    Call MD for:  severe uncontrolled pain   Complete by: As directed    Call MD for:  temperature >100.4   Complete by: As directed    Diet - low sodium heart healthy   Complete by: As directed    Diet Carb Modified   Complete by: As directed    Renal/Carb Modified Diet with 1200 mL   Discharge instructions   Complete by: As directed    You were cared for by a hospitalist during your hospital stay. If you have any questions about your discharge medications or the care you received while you were in the hospital after you are discharged, you can call the unit and ask to  speak with the hospitalist on call if the hospitalist that took care of you is not available. Once you are discharged, your primary care physician will handle any further medical issues. Please note that NO REFILLS for any discharge medications will be authorized once you are discharged, as it is imperative that you return to your primary care physician (or establish a relationship with a primary care physician if you do not have one) for your aftercare needs so that they can reassess your need for medications and monitor your lab values.  Follow up with PCP, ID, Nephrology, and Orthopedic Surgery. Take all medications as prescribed. If symptoms change or worsen please return to the ED for evaluation   Discharge wound care:   Complete by: As directed    Per Orthopedic Surgery   Increase activity slowly   Complete by: As directed    Increase activity slowly   Complete by: As directed    No wound care   Complete by: As directed      Allergies as of 04/14/2021      Reactions   Bupropion Swelling   Dexmedetomidine Nausea And Vomiting   Other reaction(s): Other (See Comments) Dose-limiting bradycardia Dose-limiting bradycardia   Ibuprofen Shortness Of Breath, Swelling   Pt tolerates aspirin Pt tolerates aspirin   Pioglitazone Anaphylaxis, Rash   Tomato Anaphylaxis      Medication List    STOP taking these medications   albuterol 108 (90 Base) MCG/ACT inhaler Commonly known as: VENTOLIN HFA   clotrimazole 1 % cream Commonly known as: LOTRIMIN   diazepam 5 MG tablet Commonly known as: VALIUM   metolazone 10 MG tablet Commonly known as: ZAROXOLYN   Nasal Spray 0.05 % Soln   torsemide 20 MG tablet Commonly known as: DEMADEX   traZODone 100 MG tablet Commonly known as: DESYREL     TAKE these medications   (feeding supplement) PROSource Plus liquid Take 30 mLs by mouth 2 (two) times daily between meals.   acetaminophen 325 MG tablet Commonly known as: TYLENOL Take 2  tablets (650 mg total) by mouth every 6 (six) hours as needed for moderate pain.   amLODipine 5 MG tablet Commonly known as: NORVASC Take 5 mg by mouth at bedtime.   Aspirin Low Dose 81 MG EC tablet Generic drug: aspirin Take 81 mg by mouth daily.   atorvastatin 40 MG tablet Commonly known as: LIPITOR Take 40 mg by mouth daily.   calcium acetate 667 MG capsule Commonly known as: PHOSLO Take 2 capsules (1,334 mg total) by mouth with breakfast, with lunch, and with evening meal. What changed: how much to take   carvedilol 12.5 MG tablet Commonly known as: COREG Take 12.5 mg by mouth 2 (two) times daily with a meal.   Cholecalciferol 50 MCG (2000 UT) Caps Take 2,000 Units by mouth daily.   Dexcom G6 Receiver Devi Use 1 Device continuously  Dexcom G6 Sensor Misc Use 1 each every 10 (ten) days   Dexcom G6 Transmitter Misc Use 1 each every 3 (three) months   diphenhydrAMINE 25 MG tablet Commonly known as: BENADRYL Take 50 mg by mouth every 6 (six) hours as needed for allergies.   gabapentin 300 MG capsule Commonly known as: NEURONTIN Take 300 mg by mouth at bedtime.   HumuLIN R U-500 KwikPen 500 UNIT/ML kwikpen Generic drug: insulin regular human CONCENTRATED Inject 65-120 Units into the skin See admin instructions. 120 units before breakfast and 65 units before dinner   nortriptyline 10 MG capsule Commonly known as: PAMELOR Take 20 mg by mouth at bedtime.   oxyCODONE 5 MG immediate release tablet Commonly known as: Oxy IR/ROXICODONE Take 1-2 tablets (5-10 mg total) by mouth every 4 (four) hours as needed for moderate pain.   pantoprazole 20 MG tablet Commonly known as: PROTONIX Take 20 mg by mouth daily.   polyethylene glycol powder 17 GM/SCOOP powder Commonly known as: GLYCOLAX/MIRALAX Take 17 g by mouth daily as needed for mild constipation.   Precision QID Test test strip Generic drug: glucose blood 4 times daily accucheck guide meter   Accu-Chek  Guide test strip Generic drug: glucose blood 4 (four) times daily.   Procysbi 300 MG Pack Generic drug: Cysteamine Bitartrate accucheck guide meter   senna-docusate 8.6-50 MG tablet Commonly known as: Senokot-S Take 2 tablets by mouth in the morning and at bedtime.   sertraline 100 MG tablet Commonly known as: ZOLOFT Take 100 mg by mouth daily.   vancomycin 1-5 GM/200ML-% Soln Commonly known as: VANCOCIN Inject 200 mLs (1,000 mg total) into the vein every Monday, Wednesday, and Friday with hemodialysis.            Discharge Care Instructions  (From admission, onward)         Start     Ordered   04/14/21 0000  Discharge wound care:       Comments: Per Orthopedic Surgery   04/14/21 0929          Allergies  Allergen Reactions  . Bupropion Swelling  . Dexmedetomidine Nausea And Vomiting    Other reaction(s): Other (See Comments) Dose-limiting bradycardia Dose-limiting bradycardia   . Ibuprofen Shortness Of Breath and Swelling    Pt tolerates aspirin Pt tolerates aspirin   . Pioglitazone Anaphylaxis and Rash  . Tomato Anaphylaxis    Consultations:  Orthopedic Surgery  Infectious Diseases  Nephrology  Procedures/Studies: CT KNEE LEFT WO CONTRAST  Result Date: 03/29/2021 CLINICAL DATA:  Fever with generalized pain and possible sepsis. History of total knee arthroplasty, date unknown. Clinical concern for joint infection. EXAM: CT OF THE LEFT KNEE WITHOUT CONTRAST TECHNIQUE: Multidetector CT imaging of the left knee was performed according to the standard protocol. Multiplanar CT image reconstructions were also generated. COMPARISON:  Radiographs 03/28/2021 FINDINGS: Bones/Joint/Cartilage Status post left total knee arthroplasty. There is prominent irregular lucency surrounding the tibial prosthesis which could reflect hardware loosening or infection. No loosening of the femoral hardware identified. The patella is located without gross bone destruction  allowing for artifact from the total knee arthroplasty. There is a moderate-sized knee joint effusion with associated capsular calcifications. No intra-articular air identified. Ligaments Suboptimally assessed by CT.  Status post total knee arthroplasty. Muscles and Tendons The extensor mechanism appears intact. The distal quadriceps tendon appears attenuated. No focal muscular abnormalities are identified. Soft tissues No periarticular fluid collections, unexpected foreign bodies or soft tissue emphysema identified. Aside from the nonspecific  joint effusion, no significant periarticular inflammatory changes are seen. IMPRESSION: 1. As demonstrated on preceding radiographs, there is irregular lucency around the tibial prosthesis which may reflect hardware loosening or infection. 2. No loosening of the femoral hardware identified. 3. Nonspecific moderate-sized knee joint effusion. Arthrocentesis recommended if persistent concern of infection. Electronically Signed   By: Richardean Sale M.D.   On: 03/29/2021 08:19   MR BRAIN WO CONTRAST  Result Date: 04/02/2021 CLINICAL DATA:  Mental status changes. The examination had to be discontinued prior to completion due to patient refusal for further imaging. EXAM: MRI HEAD WITHOUT CONTRAST TECHNIQUE: Multiplanar, multiecho pulse sequences of the brain and surrounding structures were obtained without intravenous contrast. COMPARISON:  None. FINDINGS: Brain: No acute infarct, hemorrhage, or mass lesion is present. Minimal atrophy within normal limits for age. The ventricles are of normal size. No significant extraaxial fluid collection is present. The internal auditory canals are within normal limits. The brainstem and cerebellum are within normal limits. Vascular: T2 weighted imaging was not performed. No definite vascular abnormality seen. Skull and upper cervical spine: The craniocervical junction is normal. Upper cervical spine is within normal limits. Marrow signal is  unremarkable. Sinuses/Orbits: Mild mucosal thickening present in the inferior frontal sinuses and anterior ethmoid air cells bilaterally. The globes and orbits are within normal limits. IMPRESSION: Normal MRI appearance of the brain for age. No acute or focal lesion to explain the patient's symptoms. Minimal sinus disease. Electronically Signed   By: San Morelle M.D.   On: 04/02/2021 19:03   IR Removal Tun Cv Cath W/O FL  Result Date: 03/30/2021 INDICATION: Bacteremia.  Request for tunneled hemodialysis catheter removal. EXAM: REMOVAL OF TUNNELED HEMODIALYSIS CATHETER MEDICATIONS: 10 mL 1% lidocaine COMPLICATIONS: None immediate. PROCEDURE: Informed written consent was obtained from the patient following an explanation of the procedure, risks, benefits and alternatives to treatment. A time out was performed prior to the initiation of the procedure. Maximal barrier sterile technique was utilized including caps, mask, sterile gowns, sterile gloves, large sterile drape, and hand hygiene. ChloraPrep was used to prep the patient's right neck, chest and existing catheter. 1% lidocaine with epinephrine was injected around the catheter and the subcutaneous tunnel. The catheter was dissected out using scissors and curved hemostats until the cuff was freed from the surrounding fibrous sheath. Partial cuff retained, catheter was removed intact otherwise. Hemostasis was obtained with manual compression. A dressing was placed. The patient tolerated the procedure well without immediate post procedural complication. IMPRESSION: Successful removal of tunneled dialysis catheter. Read by: Durenda Guthrie, PA-C Electronically Signed   By: Jacqulynn Cadet M.D.   On: 03/30/2021 11:06   DG Chest Port 1 View  Result Date: 03/28/2021 CLINICAL DATA:  Possible sepsis, fever, generalized pain, possible dialysis catheter infection EXAM: PORTABLE CHEST 1 VIEW COMPARISON:  03/08/2021 FINDINGS: Low lung volumes. Increasing opacities in  the mid to lower lungs, could reflect atelectasis though developing edema or atypical infection could have a similar appearance in the appropriate clinical setting. Cardiomegaly with pulmonary vascular congestion. Dual lumen dialysis catheter tip terminates at the right atrium. Telemetry leads overlie the chest. No pneumothorax or visible effusion. No acute osseous or soft tissue abnormality. IMPRESSION: Low volumes with basilar opacities which may reflect atelectasis though edema or infection could have a similar appearance in the appropriate clinical setting. Right IJ approach dialysis catheter tip terminates at the right atrium. Electronically Signed   By: Lovena Le M.D.   On: 03/28/2021 22:51   DG Knee  Complete 4 Views Left  Result Date: 03/28/2021 CLINICAL DATA:  Possible sepsis, fever and generalized pain EXAM: LEFT KNEE - COMPLETE 4+ VIEW COMPARISON:  None. FINDINGS: Extensive circumferential swelling with large joint effusion. Postsurgical changes from prior tricompartmental left knee arthroplasty with some questionable destructive changes about the articular margins as well as periprosthetic lucency surrounding the tibial component and cemented tibial stem. Additional heterotopic ossification is seen as well. No soft tissue gas. 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 Electronically Signed   By: Lovena Le M.D.   On: 03/28/2021 22:56   ECHOCARDIOGRAM COMPLETE  Result Date: 04/01/2021    ECHOCARDIOGRAM REPORT   Patient Name:   DALEN LARGENT Date of Exam: 04/01/2021 Medical Rec #:  [id number]   Height:       65.0 in Accession #:    [id number]  Weight:       271.2 lb Date of Birth:  11/04/1961   BSA:          2.251 m Patient Age:    52 years    BP:           118/63 mmHg Patient Gender: M           HR:           87  bpm. Exam Location:  Inpatient Procedure: 2D Echo, Cardiac Doppler and Color Doppler Indications:    Bacteremia  History:        Patient has no prior history of Echocardiogram examinations.                 CHF, COPD; Risk Factors:Hypertension and Diabetes.  Sonographer:    Cammy Brochure Referring Phys: [id number] [place]  1. Left ventricular ejection fraction, by estimation, is 55 to 60%. The left ventricle has normal function. The left ventricle has no regional wall motion abnormalities. There is mild concentric left ventricular hypertrophy. Left ventricular diastolic parameters are consistent with Grade II diastolic dysfunction (pseudonormalization).  2. The aortic valve is calcified out or proportion or age but within normal limits in the sub-group of ESRD. Aortic valve regurgitation is not visualized. Aortic valve sclerosis/calcification is present, without any evidence of aortic stenosis.  3. Right ventricular systolic function is normal. The right ventricular size is normal.  4. Left atrial size was mildly dilated.  5. The mitral valve is abnormal. No evidence of mitral valve regurgitation. The mean mitral valve gradient is 3.0 mmHg with average heart rate of 82 bpm.  6. The inferior vena cava is normal in size with greater than 50% respiratory variability, suggesting right atrial pressure of 3 mmHg. Comparison(s): No prior Echocardiogram. FINDINGS  Left Ventricle: Left ventricular ejection fraction, by estimation, is 55 to 60%. The left ventricle has normal function. The left ventricle has no regional wall motion abnormalities. The left ventricular internal cavity size was normal in size. There is  mild concentric left ventricular hypertrophy. Left ventricular diastolic parameters are consistent with Grade II diastolic dysfunction (pseudonormalization). Right Ventricle: The right ventricular size is normal. No increase in right ventricular wall thickness. Right ventricular systolic  function is normal. Left Atrium: Left atrial size was mildly dilated. Right Atrium: Right atrial size was normal in size. Pericardium: There is no evidence of pericardial effusion. Mitral Valve: The mitral valve is abnormal. There is moderate calcification of the mitral valve leaflet(s). Mild to  moderate mitral annular calcification. No evidence of mitral valve regurgitation. MV peak gradient, 8.5 mmHg. The mean mitral valve gradient is 3.0 mmHg with average heart rate of 82 bpm. Tricuspid Valve: The tricuspid valve is grossly normal. Tricuspid valve regurgitation is not demonstrated. No evidence of tricuspid stenosis. Aortic Valve: The aortic valve is calcified. Aortic valve regurgitation is not visualized. Mild to moderate aortic valve sclerosis/calcification is present, without any evidence of aortic stenosis. Aortic valve mean gradient measures 5.0 mmHg. Aortic valve peak gradient measures 10.4 mmHg. Aortic valve area, by VTI measures 2.63 cm. Pulmonic Valve: The pulmonic valve was grossly normal. Pulmonic valve regurgitation is not visualized. No evidence of pulmonic stenosis. Aorta: The aortic root and ascending aorta are structurally normal, with no evidence of dilitation. Venous: The inferior vena cava is normal in size with greater than 50% respiratory variability, suggesting right atrial pressure of 3 mmHg. IAS/Shunts: The atrial septum is grossly normal.  LEFT VENTRICLE PLAX 2D LVIDd:         4.70 cm  Diastology LVIDs:         3.20 cm  LV e' medial:    7.72 cm/s LV PW:         1.30 cm  LV E/e' medial:  15.9 LV IVS:        1.20 cm  LV e' lateral:   7.83 cm/s LVOT diam:     2.10 cm  LV E/e' lateral: 15.7 LV SV:         75 LV SV Index:   33 LVOT Area:     3.46 cm  RIGHT VENTRICLE             IVC RV S prime:     11.20 cm/s  IVC diam: 1.70 cm TAPSE (M-mode): 2.0 cm LEFT ATRIUM             Index       RIGHT ATRIUM           Index LA diam:        4.90 cm 2.18 cm/m  RA Area:     16.00 cm LA Vol (A2C):   80.4  ml 35.71 ml/m RA Volume:   43.50 ml  19.32 ml/m LA Vol (A4C):   87.5 ml 38.87 ml/m LA Biplane Vol: 88.6 ml 39.35 ml/m  AORTIC VALVE AV Area (Vmax):    2.43 cm AV Area (Vmean):   2.62 cm AV Area (VTI):     2.63 cm AV Vmax:           161.00 cm/s AV Vmean:          100.000 cm/s AV VTI:            0.286 m AV Peak Grad:      10.4 mmHg AV Mean Grad:      5.0 mmHg LVOT Vmax:         113.00 cm/s LVOT Vmean:        75.500 cm/s LVOT VTI:          0.217 m LVOT/AV VTI ratio: 0.76  AORTA Ao Root diam: 3.30 cm Ao Asc diam:  3.10 cm MITRAL VALVE MV Area (PHT): 4.21 cm     SHUNTS MV Area VTI:   2.09 cm     Systemic VTI:  0.22 m MV Peak grad:  8.5 mmHg     Systemic Diam: 2.10 cm MV Mean grad:  3.0 mmHg MV Vmax:       1.46 m/s MV  Vmean:      78.5 cm/s MV Decel Time: 180 msec MV E velocity: 123.00 cm/s MV A velocity: 113.00 cm/s MV E/A ratio:  1.09 Rudean Haskell MD Electronically signed by Rudean Haskell MD Signature Date/Time: 04/01/2021/4:03:06 PM    Final    Left AKA done by Dr. Lucia Gaskins  Subjective: Seen and examined at bedside doing relatively well and stable.  Still has some pain.  No nausea or vomiting.  Ready to go to a skilled nursing facility.  No other concerns or complaints at this time.  Discharge Exam: Vitals:   04/13/21 2030 04/14/21 0550  BP: 122/68 (!) 125/54  Pulse: 78 77  Resp: 20 19  Temp: 98.7 F (37.1 C) 98.2 F (36.8 C)  SpO2: 97% 97%   Vitals:   04/13/21 1232 04/13/21 1744 04/13/21 2030 04/14/21 0550  BP: 121/64 117/89 122/68 (!) 125/54  Pulse: 77 77 78 77  Resp: '18 16 20 19  '$ Temp: 97.8 F (36.6 C) 98.5 F (36.9 C) 98.7 F (37.1 C) 98.2 F (36.8 C)  TempSrc: Oral Axillary Oral Oral  SpO2:   97% 97%  Weight:      Height:       General: Pt is alert, awake, not in acute distress Cardiovascular: RRR, S1/S2 +, no rubs, no gallops Respiratory: Diminished bilaterally, no wheezing, no rhonchi Abdominal: Soft, NT, distended secondary to body habitus, bowel sounds  + Extremities: no edema, no cyanosis; he has a left AKA  The results of significant diagnostics from this hospitalization (including imaging, microbiology, ancillary and laboratory) are listed below for reference.    Microbiology: Recent Results (from the past 240 hour(s))  SARS CORONAVIRUS 2 (TAT 6-24 HRS) Nasopharyngeal Nasopharyngeal Swab     Status: None   Collection Time: 04/13/21  1:19 PM   Specimen: Nasopharyngeal Swab  Result Value Ref Range Status   SARS Coronavirus 2 NEGATIVE NEGATIVE Final    Comment: (NOTE) SARS-CoV-2 target nucleic acids are NOT DETECTED.  The SARS-CoV-2 RNA is generally detectable in upper and lower respiratory specimens during the acute phase of infection. Negative results do not preclude SARS-CoV-2 infection, do not rule out co-infections with other pathogens, and should not be used as the sole basis for treatment or other patient management decisions. Negative results must be combined with clinical observations, patient history, and epidemiological information. The expected result is Negative.  Fact Sheet for Patients: SugarRoll.be  Fact Sheet for Healthcare Providers: https://www.woods-mathews.com/  This test is not yet approved or cleared by the [place] FDA and  has been authorized for detection and/or diagnosis of SARS-CoV-2 by FDA under an Emergency Use Authorization (EUA). This EUA will remain  in effect (meaning this test can be used) for the duration of the COVID-19 declaration under Se ction 564(b)(1) of the Act, 21 U.S.C. section 360bbb-3(b)(1), unless the authorization is terminated or revoked sooner.  Performed at [facility], [place] [address] [place], [place] [address]      Labs: BNP (last 3 results) No results for input(s): BNP in the last 8760 hours. Basic Metabolic Panel: Recent Labs  Lab 04/07/21 0953 04/09/21 0900 04/12/21 1343  NA 128* 128* 130*  K 4.5 3.9 4.3   CL 94* 92* 95*  CO2 '22 25 24  '$ GLUCOSE 281* 331* 311*  BUN 58* 50* 73*  CREATININE 3.30* 3.04* 3.55*  CALCIUM 8.7* 8.6* 8.5*  PHOS 5.4*  --  4.6   Liver Function Tests: Recent Labs  Lab 04/07/21 0953 04/12/21 1343  ALBUMIN 2.6* 2.4*   No results for input(s): LIPASE, AMYLASE in the last 168 hours. No results for input(s): AMMONIA in the last 168 hours. CBC: Recent Labs  Lab 04/09/21 0900 04/12/21 1337  WBC 10.9* 9.3  HGB 8.3* 8.2*  HCT 25.7* 26.0*  MCV 89.2 91.5  PLT 174 163   Cardiac Enzymes: No results for input(s): CKTOTAL, CKMB, CKMBINDEX, TROPONINI in the last 168 hours. BNP: Invalid input(s): POCBNP CBG: Recent Labs  Lab 04/13/21 1212 04/13/21 1749 04/13/21 2006 04/13/21 2137 04/13/21 2315  GLUCAP 303* 158* 164* 76 99   D-Dimer No results for input(s): DDIMER in the last 72 hours. Hgb A1c No results for input(s): HGBA1C in the last 72 hours. Lipid Profile No results for input(s): CHOL, HDL, LDLCALC, TRIG, CHOLHDL, LDLDIRECT in the last 72 hours. Thyroid function studies No results for input(s): TSH, T4TOTAL, T3FREE, THYROIDAB in the last 72 hours.  Invalid input(s): FREET3 Anemia work up No results for input(s): VITAMINB12, FOLATE, FERRITIN, TIBC, IRON, RETICCTPCT in the last 72 hours. Urinalysis    Component Value Date/Time   COLORURINE AMBER (A) 03/30/2021 0239   APPEARANCEUR HAZY (A) 03/30/2021 0239   LABSPEC 1.018 03/30/2021 0239   PHURINE 5.0 03/30/2021 0239   GLUCOSEU NEGATIVE 03/30/2021 0239   HGBUR NEGATIVE 03/30/2021 0239   BILIRUBINUR NEGATIVE 03/30/2021 0239   KETONESUR NEGATIVE 03/30/2021 0239   PROTEINUR NEGATIVE 03/30/2021 0239   NITRITE NEGATIVE 03/30/2021 0239   LEUKOCYTESUR NEGATIVE 03/30/2021 0239   Sepsis Labs Invalid input(s): PROCALCITONIN,  WBC,  LACTICIDVEN Microbiology Recent Results (from the past 240 hour(s))  SARS CORONAVIRUS 2 (TAT 6-24 HRS) Nasopharyngeal Nasopharyngeal Swab     Status: None   Collection  Time: 04/13/21  1:19 PM   Specimen: Nasopharyngeal Swab  Result Value Ref Range Status   SARS Coronavirus 2 NEGATIVE NEGATIVE Final    Comment: (NOTE) SARS-CoV-2 target nucleic acids are NOT DETECTED.  The SARS-CoV-2 RNA is generally detectable in upper and lower respiratory specimens during the acute phase of infection. Negative results do not preclude SARS-CoV-2 infection, do not rule out co-infections with other pathogens, and should not be used as the sole basis for treatment or other patient management decisions. Negative results must be combined with clinical observations, patient history, and epidemiological information. The expected result is Negative.  Fact Sheet for Patients: SugarRoll.be  Fact Sheet for Healthcare Providers: https://www.woods-mathews.com/  This test is not yet approved or cleared by the [place] FDA and  has been authorized for detection and/or diagnosis of SARS-CoV-2 by FDA under an Emergency Use Authorization (EUA). This EUA will remain  in effect (meaning this test can be used) for the duration of the COVID-19 declaration under Se ction 564(b)(1) of the Act, 21 U.S.C. section 360bbb-3(b)(1), unless the authorization is terminated or revoked sooner.  Performed at [facility], [place] [address], [place], [place] [address]    Time coordinating discharge: 35 minutes  SIGNED:  Kerney Elbe, DO [facility] 04/14/2021, 9:29 AM Pager is on [place]  If 7PM-7AM, please contact night-coverage [email or web address]

## 2021-04-14 NOTE — Progress Notes (Signed)
Renal Navigator faxed Discharge Summary to [facility] HD clinic to complete transient referral. Patient to start outpatient HD there tomorrow at 6:00am from [facility].  Alphonzo Cruise, [place] Renal Navigator [phone]

## 2021-04-14 NOTE — TOC Transition Note (Signed)
Transition of Care [facility]) - CM/SW Discharge Note   Patient Details  Name: Jeffrey [name] MRN: [id number] Date of Birth: November 19, 1961  Transition of [facility]) CM/SW Contact:  Benard Halsted, [place] Phone Number: 04/14/2021, 9:54 AM   Clinical Narrative:    Patient will DC to: [facility] Anticipated DC date: 04/14/21 Family notified: Spouse Transport by: Pincus Badder   Per MD patient ready for DC to [facility]. RN to call report prior to discharge [phone]). RN, patient, patient's family, and facility notified of DC. Discharge Summary and FL2 sent to facility. DC packet on chart. Ambulance transport requested for patient.   CSW will sign off for now as social work intervention is no longer needed. Please consult [place] again if new needs arise.      Final next level of care: Skilled Nursing Facility Barriers to Discharge: Barriers Resolved   Patient Goals and CMS Choice Patient states their goals for this hospitalization and ongoing recovery are:: Rehab CMS Medicare.gov Compare Post Acute Care list provided to:: Patient Choice offered to / list presented to : Patient  Discharge Placement   Existing PASRR number confirmed : 04/14/21          Patient chooses bed at: Other - please specify in the comment section below: ([place]) Patient to be transferred to facility by: [facility] Name of family member notified: Spouse Patient and family notified of of transfer: 04/14/21  Discharge Plan and Services In-house Referral: Clinical Social Work   Post Acute Care Choice: [place]                               Social Determinants of Health ([place]) Interventions     Readmission Risk Interventions Readmission Risk Prevention Plan 04/07/2021  Transportation Screening Complete  Medication Review Press photographer) Referral to Pharmacy  PCP or Specialist appointment within 3-5 days of discharge Complete  HRI or Home Care  Consult Complete  SW Recovery Care/Counseling Consult Complete  Palliative Care Screening Not [place] Complete

## 2021-04-16 LAB — GLUCOSE, CAPILLARY: Glucose-Capillary: 148 mg/dL — ABNORMAL HIGH (ref 70–99)

## 2021-04-20 DIAGNOSIS — R7881 Bacteremia: Secondary | ICD-10-CM | POA: Insufficient documentation

## 2021-04-20 DIAGNOSIS — M25862 Other specified joint disorders, left knee: Secondary | ICD-10-CM | POA: Insufficient documentation

## 2021-05-04 DIAGNOSIS — Z89612 Acquired absence of left leg above knee: Secondary | ICD-10-CM | POA: Insufficient documentation

## 2021-05-04 DIAGNOSIS — R4182 Altered mental status, unspecified: Secondary | ICD-10-CM | POA: Insufficient documentation

## 2021-05-04 HISTORY — DX: Altered mental status, unspecified: R41.82

## 2021-05-06 ENCOUNTER — Telehealth: Payer: Self-pay

## 2021-05-06 NOTE — Telephone Encounter (Signed)
Patient calls to report he is currently admitted to [facility] patient that his team should be able to see Epic notes, but he can also make them aware to contact provider on call if needed.  Jeffrey [name]

## 2021-05-10 ENCOUNTER — Ambulatory Visit: Payer: Medicare Other | Admitting: Family

## 2021-05-10 ENCOUNTER — Other Ambulatory Visit: Payer: Self-pay

## 2021-06-21 DIAGNOSIS — Z89512 Acquired absence of left leg below knee: Secondary | ICD-10-CM | POA: Insufficient documentation

## 2021-06-28 DIAGNOSIS — E1165 Type 2 diabetes mellitus with hyperglycemia: Secondary | ICD-10-CM | POA: Insufficient documentation

## 2021-07-21 DIAGNOSIS — T80212S Local infection due to central venous catheter, sequela: Secondary | ICD-10-CM | POA: Insufficient documentation

## 2021-07-29 DIAGNOSIS — E119 Type 2 diabetes mellitus without complications: Secondary | ICD-10-CM | POA: Insufficient documentation

## 2021-08-03 ENCOUNTER — Emergency Department (HOSPITAL_COMMUNITY): Payer: Medicare Other

## 2021-08-03 ENCOUNTER — Encounter (HOSPITAL_COMMUNITY): Payer: Self-pay

## 2021-08-03 ENCOUNTER — Inpatient Hospital Stay (HOSPITAL_COMMUNITY)
Admission: EM | Admit: 2021-08-03 | Discharge: 2021-08-17 | DRG: 629 | Disposition: A | Discharge status: Skilled Nursing Facility | Payer: Medicare Other | Attending: Internal Medicine | Admitting: Internal Medicine

## 2021-08-03 ENCOUNTER — Other Ambulatory Visit: Payer: Self-pay

## 2021-08-03 DIAGNOSIS — M25571 Pain in right ankle and joints of right foot: Secondary | ICD-10-CM | POA: Insufficient documentation

## 2021-08-03 DIAGNOSIS — R42 Dizziness and giddiness: Secondary | ICD-10-CM | POA: Diagnosis not present

## 2021-08-03 DIAGNOSIS — M5116 Intervertebral disc disorders with radiculopathy, lumbar region: Secondary | ICD-10-CM | POA: Diagnosis present

## 2021-08-03 DIAGNOSIS — J449 Chronic obstructive pulmonary disease, unspecified: Secondary | ICD-10-CM | POA: Diagnosis present

## 2021-08-03 DIAGNOSIS — Z87891 Personal history of nicotine dependence: Secondary | ICD-10-CM

## 2021-08-03 DIAGNOSIS — F39 Unspecified mood [affective] disorder: Secondary | ICD-10-CM | POA: Diagnosis present

## 2021-08-03 DIAGNOSIS — Z6841 Body Mass Index (BMI) 40.0 and over, adult: Secondary | ICD-10-CM

## 2021-08-03 DIAGNOSIS — F419 Anxiety disorder, unspecified: Secondary | ICD-10-CM | POA: Diagnosis present

## 2021-08-03 DIAGNOSIS — M24271 Disorder of ligament, right ankle: Secondary | ICD-10-CM | POA: Insufficient documentation

## 2021-08-03 DIAGNOSIS — Y828 Other medical devices associated with adverse incidents: Secondary | ICD-10-CM | POA: Diagnosis not present

## 2021-08-03 DIAGNOSIS — I152 Hypertension secondary to endocrine disorders: Secondary | ICD-10-CM | POA: Diagnosis present

## 2021-08-03 DIAGNOSIS — Z96651 Presence of right artificial knee joint: Secondary | ICD-10-CM | POA: Diagnosis present

## 2021-08-03 DIAGNOSIS — G4733 Obstructive sleep apnea (adult) (pediatric): Secondary | ICD-10-CM | POA: Diagnosis present

## 2021-08-03 DIAGNOSIS — F4310 Post-traumatic stress disorder, unspecified: Secondary | ICD-10-CM | POA: Diagnosis present

## 2021-08-03 DIAGNOSIS — Z20822 Contact with and (suspected) exposure to covid-19: Secondary | ICD-10-CM | POA: Diagnosis present

## 2021-08-03 DIAGNOSIS — E11649 Type 2 diabetes mellitus with hypoglycemia without coma: Secondary | ICD-10-CM | POA: Diagnosis not present

## 2021-08-03 DIAGNOSIS — E785 Hyperlipidemia, unspecified: Secondary | ICD-10-CM | POA: Diagnosis present

## 2021-08-03 DIAGNOSIS — M65871 Other synovitis and tenosynovitis, right ankle and foot: Secondary | ICD-10-CM | POA: Diagnosis present

## 2021-08-03 DIAGNOSIS — E162 Hypoglycemia, unspecified: Secondary | ICD-10-CM | POA: Diagnosis present

## 2021-08-03 DIAGNOSIS — Z992 Dependence on renal dialysis: Secondary | ICD-10-CM

## 2021-08-03 DIAGNOSIS — Z794 Long term (current) use of insulin: Secondary | ICD-10-CM

## 2021-08-03 DIAGNOSIS — Z8616 Personal history of COVID-19: Secondary | ICD-10-CM

## 2021-08-03 DIAGNOSIS — E1142 Type 2 diabetes mellitus with diabetic polyneuropathy: Secondary | ICD-10-CM | POA: Diagnosis present

## 2021-08-03 DIAGNOSIS — M25871 Other specified joint disorders, right ankle and foot: Secondary | ICD-10-CM | POA: Diagnosis present

## 2021-08-03 DIAGNOSIS — Z888 Allergy status to other drugs, medicaments and biological substances status: Secondary | ICD-10-CM

## 2021-08-03 DIAGNOSIS — Z79899 Other long term (current) drug therapy: Secondary | ICD-10-CM

## 2021-08-03 DIAGNOSIS — M722 Plantar fascial fibromatosis: Secondary | ICD-10-CM | POA: Diagnosis present

## 2021-08-03 DIAGNOSIS — Z89612 Acquired absence of left leg above knee: Secondary | ICD-10-CM

## 2021-08-03 DIAGNOSIS — I5032 Chronic diastolic (congestive) heart failure: Secondary | ICD-10-CM | POA: Diagnosis present

## 2021-08-03 DIAGNOSIS — N186 End stage renal disease: Secondary | ICD-10-CM | POA: Diagnosis present

## 2021-08-03 DIAGNOSIS — T82590A Other mechanical complication of surgically created arteriovenous fistula, initial encounter: Secondary | ICD-10-CM | POA: Diagnosis not present

## 2021-08-03 DIAGNOSIS — G546 Phantom limb syndrome with pain: Secondary | ICD-10-CM | POA: Diagnosis present

## 2021-08-03 DIAGNOSIS — E876 Hypokalemia: Secondary | ICD-10-CM | POA: Diagnosis not present

## 2021-08-03 DIAGNOSIS — M48061 Spinal stenosis, lumbar region without neurogenic claudication: Secondary | ICD-10-CM | POA: Diagnosis present

## 2021-08-03 DIAGNOSIS — G43909 Migraine, unspecified, not intractable, without status migrainosus: Secondary | ICD-10-CM | POA: Diagnosis not present

## 2021-08-03 DIAGNOSIS — M62838 Other muscle spasm: Secondary | ICD-10-CM | POA: Diagnosis present

## 2021-08-03 DIAGNOSIS — Z8614 Personal history of Methicillin resistant Staphylococcus aureus infection: Secondary | ICD-10-CM

## 2021-08-03 DIAGNOSIS — Z886 Allergy status to analgesic agent status: Secondary | ICD-10-CM

## 2021-08-03 DIAGNOSIS — Z7982 Long term (current) use of aspirin: Secondary | ICD-10-CM

## 2021-08-03 DIAGNOSIS — D631 Anemia in chronic kidney disease: Secondary | ICD-10-CM | POA: Diagnosis present

## 2021-08-03 DIAGNOSIS — Z9111 Patient's noncompliance with dietary regimen: Secondary | ICD-10-CM

## 2021-08-03 DIAGNOSIS — S93491A Sprain of other ligament of right ankle, initial encounter: Secondary | ICD-10-CM | POA: Diagnosis present

## 2021-08-03 DIAGNOSIS — G834 Cauda equina syndrome: Secondary | ICD-10-CM | POA: Diagnosis present

## 2021-08-03 DIAGNOSIS — M898X9 Other specified disorders of bone, unspecified site: Secondary | ICD-10-CM | POA: Diagnosis present

## 2021-08-03 DIAGNOSIS — X58XXXA Exposure to other specified factors, initial encounter: Secondary | ICD-10-CM | POA: Diagnosis present

## 2021-08-03 DIAGNOSIS — E1165 Type 2 diabetes mellitus with hyperglycemia: Secondary | ICD-10-CM | POA: Diagnosis not present

## 2021-08-03 DIAGNOSIS — I132 Hypertensive heart and chronic kidney disease with heart failure and with stage 5 chronic kidney disease, or end stage renal disease: Secondary | ICD-10-CM | POA: Diagnosis present

## 2021-08-03 DIAGNOSIS — M19071 Primary osteoarthritis, right ankle and foot: Secondary | ICD-10-CM | POA: Diagnosis present

## 2021-08-03 DIAGNOSIS — E1122 Type 2 diabetes mellitus with diabetic chronic kidney disease: Secondary | ICD-10-CM | POA: Diagnosis present

## 2021-08-03 DIAGNOSIS — D72829 Elevated white blood cell count, unspecified: Secondary | ICD-10-CM | POA: Diagnosis present

## 2021-08-03 DIAGNOSIS — Z9115 Patient's noncompliance with renal dialysis: Secondary | ICD-10-CM

## 2021-08-03 DIAGNOSIS — E1159 Type 2 diabetes mellitus with other circulatory complications: Secondary | ICD-10-CM | POA: Diagnosis present

## 2021-08-03 DIAGNOSIS — K029 Dental caries, unspecified: Secondary | ICD-10-CM | POA: Diagnosis present

## 2021-08-03 MED ORDER — HYDROMORPHONE HCL 1 MG/ML IJ SOLN
1.0000 mg | Freq: Once | INTRAMUSCULAR | Status: AC
  Administered 2021-08-04: 1 mg via INTRAVENOUS
  Filled 2021-08-03: qty 1

## 2021-08-03 NOTE — ED Triage Notes (Signed)
Pt c/o R sided body spasms x2 days. L sided dialysis port also hurting x2 days. Missed dialysis Monday. CP x1 day radiating to L arm.

## 2021-08-03 NOTE — ED Provider Notes (Signed)
[facility] EMERGENCY DEPARTMENT Provider Note   CSN: [id number] Arrival date & time: 08/03/21  2140     History Chief Complaint  Patient presents with   Chest Pain   R sided body aches    Jeffrey [name] is a 60 y.o. male.  60 yo M with a chief complaints of diffuse cramping.  This been going on for at least a couple days.  He thinks that it starts in his right leg and then moves upwards.  He tells me he had similar discomfort before he had to have his left lower extremity amputated.  He denies wounds denies fevers.  Denies trauma.  The history is provided by the patient.  Chest Pain Pain location:  R lateral chest Pain quality: aching   Pain radiates to:  Does not radiate Pain severity:  Moderate Onset quality:  Gradual Duration:  3 days Timing:  Intermittent Progression:  Waxing and waning Chronicity:  New Relieved by:  Nothing Worsened by:  Nothing Ineffective treatments:  None tried Associated symptoms: no abdominal pain, no fever, no headache, no palpitations, no shortness of breath and no vomiting       Past Medical History:  Diagnosis Date   Arthritis    Blood transfusion without reported diagnosis    CHF (congestive heart failure) (HCC)    COPD (chronic obstructive pulmonary disease) (HCC)    Diabetes mellitus without complication (HCC)    Hypertension    PTSD (post-traumatic stress disorder)    Renal disorder    Stage 5 Kidney Failure    Patient Active Problem List   Diagnosis Date Noted   Sepsis ([place])    Severe anemia    Infection of prosthetic left knee joint ([place])    MRSA bacteremia 03/30/2021   Left knee pain    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    Fever 03/29/2021   ESRD (end stage renal disease) ([place]) 03/29/2021   COPD (chronic obstructive pulmonary disease) ([place]) 03/29/2021   Hypertension complicating diabetes ([place]) 03/29/2021   Type 2 diabetes mellitus with hyperlipidemia ([place]) 03/29/2021   PTSD  (post-traumatic stress disorder) 03/29/2021   CHF (congestive heart failure) ([place]) 03/29/2021    Past Surgical History:  Procedure Laterality Date   AMPUTATION Left 04/03/2021   Procedure: LEFT ABOVE-THE-KNEE AMPUTATION;  Surgeon: Erle Crocker, MD;  Location: [facility];  Service: Orthopedics;  Laterality: Left;   IR REMOVAL TUN CV CATH W/O FL  03/30/2021   JOINT REPLACEMENT     Bilateral knees       No family history on file.  Social History   Tobacco Use   Smoking status: Former    Types: Cigarettes    Quit date: 2012    Years since quitting: 10.6   Smokeless tobacco: Never  Vaping Use   Vaping Use: Never used  Substance Use Topics   Alcohol use: Yes   Drug use: Not Currently    Comment: Years ago    Home Medications Prior to Admission medications   Medication Sig Start Date End Date Taking? Authorizing Provider  ACCU-CHEK GUIDE test strip 4 (four) times daily. 11/15/20   [provider]  acetaminophen (TYLENOL) 325 MG tablet Take 2 tablets (650 mg total) by mouth every 6 (six) hours as needed for moderate pain. 04/14/21   Raiford Noble Latif, DO  amLODipine (NORVASC) 5 MG tablet Take 5 mg by mouth at bedtime. 04/17/20   [provider]  ASPIRIN LOW DOSE 81 MG EC  tablet Take 81 mg by mouth daily. 02/22/21   [provider]  atorvastatin (LIPITOR) 40 MG tablet Take 40 mg by mouth daily. 02/09/21   [provider]  calcium acetate (PHOSLO) 667 MG capsule Take 2 capsules (1,334 mg total) by mouth with breakfast, with lunch, and with evening meal. 04/10/21   Debbe Odea, MD  carvedilol (COREG) 12.5 MG tablet Take 12.5 mg by mouth 2 (two) times daily with a meal. 04/17/20   [provider]  Cholecalciferol 50 MCG (2000 UT) CAPS Take 2,000 Units by mouth daily. 08/24/20   [provider]  Continuous Blood Gluc Receiver (DEXCOM G6 RECEIVER) DEVI Use 1 Device continuously 03/08/21   [provider]  Continuous Blood Gluc  Sensor (DEXCOM G6 SENSOR) MISC Use 1 each every 10 (ten) days 03/08/21   [provider]  Continuous Blood Gluc Transmit (DEXCOM G6 TRANSMITTER) MISC Use 1 each every 3 (three) months 03/08/21   [provider]  Cysteamine Bitartrate (PROCYSBI) 300 MG PACK accucheck guide meter 03/09/20   [provider]  diphenhydrAMINE (BENADRYL) 25 MG tablet Take 50 mg by mouth every 6 (six) hours as needed for allergies.    [provider]  gabapentin (NEURONTIN) 300 MG capsule Take 300 mg by mouth at bedtime. 02/22/21   [provider]  glucose blood (PRECISION QID TEST) test strip 4 times daily accucheck guide meter 09/28/20   [provider]  insulin regular human CONCENTRATED (HUMULIN R U-500 KWIKPEN) 500 UNIT/ML kwikpen Inject 65-120 Units into the skin See admin instructions. 120 units before breakfast and 65 units before dinner 09/28/20   [provider]  nortriptyline (PAMELOR) 10 MG capsule Take 20 mg by mouth at bedtime. 10/23/19 02/22/22  [provider]  Nutritional Supplements (,FEEDING SUPPLEMENT, PROSOURCE PLUS) liquid Take 30 mLs by mouth 2 (two) times daily between meals. 04/11/21   Debbe Odea, MD  oxyCODONE (OXY IR/ROXICODONE) 5 MG immediate release tablet Take 1-2 tablets (5-10 mg total) by mouth every 4 (four) hours as needed for moderate pain. 04/14/21   Raiford Noble Latif, DO  pantoprazole (PROTONIX) 20 MG tablet Take 20 mg by mouth daily. 02/09/21   [provider]  polyethylene glycol powder (GLYCOLAX/MIRALAX) 17 GM/SCOOP powder Take 17 g by mouth daily as needed for mild constipation. 08/24/20   [provider]  senna-docusate (SENOKOT-S) 8.6-50 MG tablet Take 2 tablets by mouth in the morning and at bedtime. 10/23/19   [provider]  sertraline (ZOLOFT) 100 MG tablet Take 100 mg by mouth daily. 02/22/21   [provider]  vancomycin (VANCOCIN) 1-5 GM/200ML-% SOLN Inject 200 mLs (1,000 mg  total) into the vein every Monday, Wednesday, and Friday with hemodialysis. 04/12/21   Debbe Odea, MD    Allergies    Bupropion, Dexmedetomidine, Ibuprofen, Pioglitazone, and Tomato  Review of Systems   Review of Systems  Constitutional:  Negative for chills and fever.  HENT:  Negative for congestion and facial swelling.   Eyes:  Negative for discharge and visual disturbance.  Respiratory:  Negative for shortness of breath.   Cardiovascular:  Positive for chest pain. Negative for palpitations.  Gastrointestinal:  Negative for abdominal pain, diarrhea and vomiting.  Musculoskeletal:  Negative for arthralgias and myalgias.  Skin:  Negative for color change and rash.  Neurological:  Negative for tremors, syncope and headaches.  Psychiatric/Behavioral:  Negative for confusion and dysphoric mood.    Physical Exam Updated Vital Signs BP 128/76 (BP Location: Left Arm)  Pulse 85   Temp 98.4 F (36.9 C) (Oral)   Resp 18   Ht '5\' 5"'$  (1.651 m)   Wt 125.6 kg   SpO2 96%   BMI 46.10 kg/m   Physical Exam Vitals and nursing note reviewed.  Constitutional:      Appearance: He is well-developed.  HENT:     Head: Normocephalic and atraumatic.  Eyes:     Pupils: Pupils are equal, round, and reactive to light.  Neck:     Vascular: No JVD.  Cardiovascular:     Rate and Rhythm: Normal rate and regular rhythm.     Heart sounds: No murmur heard.   No friction rub. No gallop.  Pulmonary:     Effort: No respiratory distress.     Breath sounds: No wheezing.  Abdominal:     General: There is no distension.     Tenderness: There is no abdominal tenderness. There is no guarding or rebound.  Musculoskeletal:        General: Normal range of motion.     Cervical back: Normal range of motion and neck supple.     Comments: Right ankle mildly tender to palpation.  No edema no erythema no warmth.  I am able to range it mildly without significant pain. PMS intact distally.   Skin:    Coloration:  Skin is not pale.     Findings: No rash.  Neurological:     Mental Status: He is alert and oriented to person, place, and time.  Psychiatric:        Behavior: Behavior normal.    ED Results / Procedures / Treatments   Labs (all labs ordered are listed, but only abnormal results are displayed) Labs Reviewed  CBC WITH DIFFERENTIAL/PLATELET  BASIC METABOLIC PANEL  MAGNESIUM  I-STAT CHEM 8, ED    EKG EKG Interpretation  Date/Time:  Tuesday August 03 2021 21:47:23 EDT Ventricular Rate:  84 PR Interval:  146 QRS Duration: 92 QT Interval:  424 QTC Calculation: 502 R Axis:   88 Text Interpretation: Sinus rhythm Borderline right axis deviation Prolonged QT interval No significant change since last tracing Confirmed by Deno Etienne [phone]) on 08/03/2021 9:49:11 PM  Radiology DG Ankle Complete Right  Result Date: 08/03/2021 CLINICAL DATA:  Right ankle pain EXAM: RIGHT ANKLE - COMPLETE 3+ VIEW COMPARISON:  None. FINDINGS: There is mild widening of the lateral tibiotalar joint space which may reflect the sequela of a ligamentous injury involving the lateral ligaments. There is no associated fracture or frank dislocation. No ankle effusion. Tiny plantar calcaneal spur. Mild bimalleolar soft tissue swelling. IMPRESSION: Mild widening of the lateral joint space which may reflect the sequela of ligamentous instability or injury. Mild bimalleolar soft tissue swelling. Electronically Signed   By: Fidela Salisbury M.D.   On: 08/03/2021 23:03    Procedures Procedures   Medications Ordered in ED Medications  HYDROmorphone (DILAUDID) injection 1 mg (has no administration in time range)    ED Course  I have reviewed the triage vital signs and the nursing notes.  Pertinent labs & imaging results that were available during my care of the patient were reviewed by me and considered in my medical decision making (see chart for details).    MDM Rules/Calculators/A&P                           60 yo  M with a cc of diffuse muscular cramping.  Going on for  a few days now.  Patient did miss his last dialysis session.  Possibly electrolyte derangement.  Will obtain blood work.  Patient was initially complaining of lower extremity pain and he was concerned that he may need amputation though the patient has no obvious erythema or warmth to the area.  Plain film with joint space widening.  ASO.  Ortho follow up.  Signed out to Dr. Randal Buba, please see their note for further details of care in the ED.    The patients results and plan were reviewed and discussed.   Any x-rays performed were independently reviewed by myself.   Differential diagnosis were considered with the presenting HPI.  Medications  HYDROmorphone (DILAUDID) injection 1 mg (has no administration in time range)    Vitals:   08/03/21 2141 08/03/21 2145 08/03/21 2148  BP:  128/76   Pulse:  85   Resp:  18   Temp:  98.4 F (36.9 C)   TempSrc:  Oral   SpO2: 99% 96%   Weight:   125.6 kg  Height:   '5\' 5"'$  (1.651 m)    Final diagnoses:  Acute right ankle pain  Muscle spasm    Admission/ observation were discussed with the admitting physician, patient and/or family and they are comfortable with the plan.     Final Clinical Impression(s) / ED Diagnoses Final diagnoses:  Acute right ankle pain  Muscle spasm    Rx / DC Orders ED Discharge Orders     None        Deno Etienne, DO 08/03/21 2334

## 2021-08-04 ENCOUNTER — Emergency Department (HOSPITAL_COMMUNITY): Payer: Medicare Other

## 2021-08-04 ENCOUNTER — Encounter (HOSPITAL_COMMUNITY): Payer: Self-pay | Admitting: Internal Medicine

## 2021-08-04 DIAGNOSIS — N186 End stage renal disease: Secondary | ICD-10-CM

## 2021-08-04 DIAGNOSIS — E162 Hypoglycemia, unspecified: Secondary | ICD-10-CM | POA: Diagnosis not present

## 2021-08-04 LAB — RESP PANEL BY RT-PCR (FLU A&B, COVID) ARPGX2
Influenza A by PCR: NEGATIVE
Influenza B by PCR: NEGATIVE
SARS Coronavirus 2 by RT PCR: NEGATIVE

## 2021-08-04 LAB — BASIC METABOLIC PANEL
Anion gap: 15 (ref 5–15)
BUN: 130 mg/dL — ABNORMAL HIGH (ref 8–23)
CO2: 32 mmol/L (ref 22–32)
Calcium: 8.7 mg/dL — ABNORMAL LOW (ref 8.9–10.3)
Chloride: 94 mmol/L — ABNORMAL LOW (ref 98–111)
Creatinine, Ser: 3.93 mg/dL — ABNORMAL HIGH (ref 0.61–1.24)
GFR, Estimated: 17 mL/min — ABNORMAL LOW (ref >60)
Glucose, Bld: 26 mg/dL — CL (ref 70–99)
Potassium: 3.1 mmol/L — ABNORMAL LOW (ref 3.5–5.1)
Sodium: 141 mmol/L (ref 135–145)

## 2021-08-04 LAB — HEMOGLOBIN A1C
Hgb A1c MFr Bld: 9.5 % — ABNORMAL HIGH (ref 4.8–5.6)
Mean Plasma Glucose: 225.95 mg/dL

## 2021-08-04 LAB — I-STAT CHEM 8, ED
BUN: >130 mg/dL — ABNORMAL HIGH (ref 8–23)
Calcium, Ion: 0.98 mmol/L — ABNORMAL LOW (ref 1.15–1.40)
Chloride: 93 mmol/L — ABNORMAL LOW (ref 98–111)
Creatinine, Ser: 4 mg/dL — ABNORMAL HIGH (ref 0.61–1.24)
Glucose, Bld: 26 mg/dL — CL (ref 70–99)
HCT: 39 % (ref 39.0–52.0)
Hemoglobin: 13.3 g/dL (ref 13.0–17.0)
Potassium: 3.1 mmol/L — ABNORMAL LOW (ref 3.5–5.1)
Sodium: 141 mmol/L (ref 135–145)
TCO2: 38 mmol/L — ABNORMAL HIGH (ref 22–32)

## 2021-08-04 LAB — CBC WITH DIFFERENTIAL/PLATELET
Abs Immature Granulocytes: 0.11 10*3/uL — ABNORMAL HIGH (ref 0.00–0.07)
Basophils Absolute: 0.1 10*3/uL (ref 0.0–0.1)
Basophils Relative: 0 %
Eosinophils Absolute: 0.1 10*3/uL (ref 0.0–0.5)
Eosinophils Relative: 1 %
HCT: 38.5 % — ABNORMAL LOW (ref 39.0–52.0)
Hemoglobin: 12 g/dL — ABNORMAL LOW (ref 13.0–17.0)
Immature Granulocytes: 1 %
Lymphocytes Relative: 16 %
Lymphs Abs: 2.6 10*3/uL (ref 0.7–4.0)
MCH: 27.2 pg (ref 26.0–34.0)
MCHC: 31.2 g/dL (ref 30.0–36.0)
MCV: 87.3 fL (ref 80.0–100.0)
Monocytes Absolute: 1.2 10*3/uL — ABNORMAL HIGH (ref 0.1–1.0)
Monocytes Relative: 7 %
Neutro Abs: 12.4 10*3/uL — ABNORMAL HIGH (ref 1.7–7.7)
Neutrophils Relative %: 75 %
Platelets: 234 10*3/uL (ref 150–400)
RBC: 4.41 MIL/uL (ref 4.22–5.81)
RDW: 15.6 % — ABNORMAL HIGH (ref 11.5–15.5)
WBC: 16.4 10*3/uL — ABNORMAL HIGH (ref 4.0–10.5)
nRBC: 0 % (ref 0.0–0.2)

## 2021-08-04 LAB — TROPONIN I (HIGH SENSITIVITY)
Troponin I (High Sensitivity): 20 ng/L — ABNORMAL HIGH (ref <18)
Troponin I (High Sensitivity): 17 ng/L (ref <18)

## 2021-08-04 LAB — CBC
HCT: 34.2 % — ABNORMAL LOW (ref 39.0–52.0)
Hemoglobin: 10.5 g/dL — ABNORMAL LOW (ref 13.0–17.0)
MCH: 27.3 pg (ref 26.0–34.0)
MCHC: 30.7 g/dL (ref 30.0–36.0)
MCV: 89.1 fL (ref 80.0–100.0)
Platelets: 143 10*3/uL — ABNORMAL LOW (ref 150–400)
RBC: 3.84 MIL/uL — ABNORMAL LOW (ref 4.22–5.81)
RDW: 15.3 % (ref 11.5–15.5)
WBC: 8.9 10*3/uL (ref 4.0–10.5)
nRBC: 0 % (ref 0.0–0.2)

## 2021-08-04 LAB — GLUCOSE, CAPILLARY
Glucose-Capillary: 231 mg/dL — ABNORMAL HIGH (ref 70–99)
Glucose-Capillary: 438 mg/dL — ABNORMAL HIGH (ref 70–99)
Glucose-Capillary: 447 mg/dL — ABNORMAL HIGH (ref 70–99)

## 2021-08-04 LAB — CBG MONITORING, ED
Glucose-Capillary: 145 mg/dL — ABNORMAL HIGH (ref 70–99)
Glucose-Capillary: 156 mg/dL — ABNORMAL HIGH (ref 70–99)
Glucose-Capillary: 207 mg/dL — ABNORMAL HIGH (ref 70–99)

## 2021-08-04 LAB — MAGNESIUM: Magnesium: 1.7 mg/dL (ref 1.7–2.4)

## 2021-08-04 LAB — CREATININE, SERUM
Creatinine, Ser: 4.07 mg/dL — ABNORMAL HIGH (ref 0.61–1.24)
GFR, Estimated: 16 mL/min — ABNORMAL LOW (ref >60)

## 2021-08-04 MED ORDER — INSULIN ASPART 100 UNIT/ML IJ SOLN: 0.0000 [IU] | Freq: Three times a day (TID) | INTRAMUSCULAR | Status: DC

## 2021-08-04 MED ORDER — LIDOCAINE HCL (PF) 1 % IJ SOLN: 5.0000 mL | INTRAMUSCULAR | Status: DC | PRN

## 2021-08-04 MED ORDER — HYDROMORPHONE HCL 2 MG PO TABS
2.0000 mg | ORAL_TABLET | Freq: Four times a day (QID) | ORAL | Status: DC | PRN
  Administered 2021-08-04 – 2021-08-05 (×3): 2 mg via ORAL
  Filled 2021-08-04 (×3): qty 1

## 2021-08-04 MED ORDER — HEPARIN SODIUM (PORCINE) 5000 UNIT/ML IJ SOLN
5000.0000 [IU] | Freq: Three times a day (TID) | INTRAMUSCULAR | Status: DC
  Administered 2021-08-04 – 2021-08-17 (×33): 5000 [IU] via SUBCUTANEOUS
  Filled 2021-08-04 (×35): qty 1

## 2021-08-04 MED ORDER — PENTAFLUOROPROP-TETRAFLUOROETH EX AERO
1.0000 "application " | INHALATION_SPRAY | CUTANEOUS | Status: DC | PRN
  Filled 2021-08-04: qty 116

## 2021-08-04 MED ORDER — SODIUM CHLORIDE 0.9 % IV SOLN: 250.0000 mL | INTRAVENOUS | Status: DC | PRN

## 2021-08-04 MED ORDER — ACETAMINOPHEN 650 MG RE SUPP: 650.0000 mg | Freq: Four times a day (QID) | RECTAL | Status: DC | PRN

## 2021-08-04 MED ORDER — DEXTROSE 50 % IV SOLN: 1.0000 | Freq: Once | INTRAVENOUS | Status: AC

## 2021-08-04 MED ORDER — CHLORHEXIDINE GLUCONATE CLOTH 2 % EX PADS
6.0000 | MEDICATED_PAD | Freq: Every day | CUTANEOUS | Status: DC
  Administered 2021-08-05 – 2021-08-08 (×4): 6 via TOPICAL

## 2021-08-04 MED ORDER — ACETAMINOPHEN 500 MG PO TABS: 1000.0000 mg | ORAL_TABLET | Freq: Once | ORAL | Status: DC

## 2021-08-04 MED ORDER — SODIUM CHLORIDE 0.9 % IV SOLN: 100.0000 mL | INTRAVENOUS | Status: DC | PRN

## 2021-08-04 MED ORDER — ATORVASTATIN CALCIUM 40 MG PO TABS
40.0000 mg | ORAL_TABLET | Freq: Every day | ORAL | Status: DC
  Administered 2021-08-04 – 2021-08-17 (×14): 40 mg via ORAL
  Filled 2021-08-04 (×14): qty 1

## 2021-08-04 MED ORDER — DEXTROSE-NACL 5-0.9 % IV SOLN: Freq: Once | INTRAVENOUS | Status: AC

## 2021-08-04 MED ORDER — DEXTROSE 50 % IV SOLN
INTRAVENOUS | Status: AC
  Administered 2021-08-04: 50 mL via INTRAVENOUS
  Filled 2021-08-04: qty 50

## 2021-08-04 MED ORDER — CARVEDILOL 12.5 MG PO TABS
12.5000 mg | ORAL_TABLET | Freq: Two times a day (BID) | ORAL | Status: DC
  Administered 2021-08-04 – 2021-08-17 (×25): 12.5 mg via ORAL
  Filled 2021-08-04 (×25): qty 1

## 2021-08-04 MED ORDER — NORTRIPTYLINE HCL 10 MG PO CAPS
20.0000 mg | ORAL_CAPSULE | Freq: Every day | ORAL | Status: DC
  Administered 2021-08-04 – 2021-08-17 (×14): 20 mg via ORAL
  Filled 2021-08-04 (×14): qty 2

## 2021-08-04 MED ORDER — HYDROMORPHONE HCL 1 MG/ML IJ SOLN
1.0000 mg | Freq: Once | INTRAMUSCULAR | Status: AC
Start: 2021-08-04 — End: 2021-08-04
  Administered 2021-08-04: 1 mg via INTRAVENOUS
  Filled 2021-08-04: qty 1

## 2021-08-04 MED ORDER — CALCIUM ACETATE (PHOS BINDER) 667 MG PO CAPS
1334.0000 mg | ORAL_CAPSULE | Freq: Three times a day (TID) | ORAL | Status: DC
  Administered 2021-08-04 – 2021-08-17 (×38): 1334 mg via ORAL
  Filled 2021-08-04 (×38): qty 2

## 2021-08-04 MED ORDER — ALTEPLASE 2 MG IJ SOLR
2.0000 mg | Freq: Once | INTRAMUSCULAR | Status: DC | PRN
  Filled 2021-08-04: qty 2

## 2021-08-04 MED ORDER — METHOCARBAMOL 500 MG PO TABS: 1000.0000 mg | ORAL_TABLET | ORAL | Status: DC

## 2021-08-04 MED ORDER — HEPARIN SODIUM (PORCINE) 1000 UNIT/ML DIALYSIS
1000.0000 [IU] | INTRAMUSCULAR | Status: DC | PRN
  Filled 2021-08-04 (×2): qty 1

## 2021-08-04 MED ORDER — DIAZEPAM 5 MG PO TABS
5.0000 mg | ORAL_TABLET | Freq: Two times a day (BID) | ORAL | Status: DC | PRN
  Administered 2021-08-04 – 2021-08-13 (×3): 5 mg via ORAL
  Filled 2021-08-04 (×3): qty 1

## 2021-08-04 MED ORDER — CYCLOBENZAPRINE HCL 10 MG PO TABS
5.0000 mg | ORAL_TABLET | Freq: Three times a day (TID) | ORAL | Status: DC
  Administered 2021-08-04 – 2021-08-08 (×11): 5 mg via ORAL
  Filled 2021-08-04 (×11): qty 1

## 2021-08-04 MED ORDER — LIDOCAINE-PRILOCAINE 2.5-2.5 % EX CREA
1.0000 "application " | TOPICAL_CREAM | CUTANEOUS | Status: DC | PRN
  Filled 2021-08-04: qty 5

## 2021-08-04 MED ORDER — ASPIRIN EC 81 MG PO TBEC
81.0000 mg | DELAYED_RELEASE_TABLET | Freq: Every day | ORAL | Status: DC
  Administered 2021-08-04 – 2021-08-17 (×14): 81 mg via ORAL
  Filled 2021-08-04 (×15): qty 1

## 2021-08-04 MED ORDER — INSULIN ASPART 100 UNIT/ML IJ SOLN
8.0000 [IU] | Freq: Once | INTRAMUSCULAR | Status: AC
  Administered 2021-08-04: 8 [IU] via SUBCUTANEOUS

## 2021-08-04 MED ORDER — METOLAZONE 5 MG PO TABS
10.0000 mg | ORAL_TABLET | Freq: Every day | ORAL | Status: DC
  Administered 2021-08-04 – 2021-08-17 (×12): 10 mg via ORAL
  Filled 2021-08-04 (×14): qty 2

## 2021-08-04 MED ORDER — SODIUM CHLORIDE 0.9% FLUSH: 3.0000 mL | INTRAVENOUS | Status: DC | PRN

## 2021-08-04 MED ORDER — AMLODIPINE BESYLATE 5 MG PO TABS
5.0000 mg | ORAL_TABLET | Freq: Every day | ORAL | Status: DC
  Administered 2021-08-04 – 2021-08-17 (×14): 5 mg via ORAL
  Filled 2021-08-04 (×14): qty 1

## 2021-08-04 MED ORDER — LIDOCAINE 5 % EX PTCH
3.0000 | MEDICATED_PATCH | CUTANEOUS | Status: DC
  Administered 2021-08-05 – 2021-08-17 (×13): 3 via TRANSDERMAL
  Filled 2021-08-04 (×13): qty 3

## 2021-08-04 MED ORDER — TORSEMIDE 20 MG PO TABS
100.0000 mg | ORAL_TABLET | Freq: Two times a day (BID) | ORAL | Status: DC
  Administered 2021-08-04 – 2021-08-10 (×12): 100 mg via ORAL
  Filled 2021-08-04 (×14): qty 5

## 2021-08-04 MED ORDER — ALBUTEROL SULFATE HFA 108 (90 BASE) MCG/ACT IN AERS
1.0000 | INHALATION_SPRAY | Freq: Four times a day (QID) | RESPIRATORY_TRACT | Status: DC | PRN
  Filled 2021-08-04: qty 6.7

## 2021-08-04 MED ORDER — DEXTROSE 10 % IV SOLN: INTRAVENOUS | Status: DC

## 2021-08-04 MED ORDER — GABAPENTIN 300 MG PO CAPS
300.0000 mg | ORAL_CAPSULE | Freq: Two times a day (BID) | ORAL | Status: DC
  Administered 2021-08-04 – 2021-08-09 (×10): 300 mg via ORAL
  Filled 2021-08-04 (×10): qty 1

## 2021-08-04 MED ORDER — PANTOPRAZOLE SODIUM 20 MG PO TBEC
20.0000 mg | DELAYED_RELEASE_TABLET | Freq: Every day | ORAL | Status: DC
  Administered 2021-08-04 – 2021-08-17 (×14): 20 mg via ORAL
  Filled 2021-08-04 (×14): qty 1

## 2021-08-04 MED ORDER — ACETAMINOPHEN 325 MG PO TABS
650.0000 mg | ORAL_TABLET | Freq: Four times a day (QID) | ORAL | Status: DC | PRN
  Administered 2021-08-12 – 2021-08-14 (×2): 650 mg via ORAL
  Filled 2021-08-04 (×4): qty 2

## 2021-08-04 MED ORDER — SODIUM CHLORIDE 0.9% FLUSH
3.0000 mL | Freq: Two times a day (BID) | INTRAVENOUS | Status: DC
  Administered 2021-08-04: 3 mL via INTRAVENOUS

## 2021-08-04 MED ORDER — SERTRALINE HCL 100 MG PO TABS
100.0000 mg | ORAL_TABLET | Freq: Every day | ORAL | Status: DC
  Administered 2021-08-04 – 2021-08-17 (×14): 100 mg via ORAL
  Filled 2021-08-04 (×14): qty 1

## 2021-08-04 MED ORDER — GABAPENTIN 300 MG PO CAPS: 300.0000 mg | ORAL_CAPSULE | Freq: Every day | ORAL | Status: DC

## 2021-08-04 NOTE — ED Notes (Addendum)
MD notified of CBG 207, orders for continuous D10 and corrective insulin. Clarification requested.

## 2021-08-04 NOTE — Progress Notes (Signed)
[facility] PLAN OF CARE NOTE Patient: Jeffrey [name] [id number]   PCP: Pcp, No DOB: 01/19/1961   DOA: 08/03/2021   DOS: 08/04/2021    Patient was admitted by my colleague earlier on 08/04/2021. I have reviewed the H&P as well as assessment and plan and agree with the same. Important changes in the plan are listed below.  Plan of care: Principal Problem:   Hypoglycemia Active Problems:   ESRD (end stage renal disease) (HCC)   COPD (chronic obstructive pulmonary disease) ([place])   Hypertension complicating diabetes [facility]) Nephrology consulted.  Author: Berle Mull, MD [facility] 08/04/2021 7:49 PM   If 7PM-7AM, please contact night-coverage at [email or web address]

## 2021-08-04 NOTE — ED Notes (Signed)
Report given to dialysis RN

## 2021-08-04 NOTE — ED Notes (Signed)
Pt requesting IV pain medication; states PO meds are not effective for him. Secure chat sent to MD.

## 2021-08-04 NOTE — ED Notes (Signed)
Nephrology at bedside assessing pt at this time.

## 2021-08-04 NOTE — H&P (Signed)
History and Physical    Jeffrey [name] [id number] DOB: [birth year] DOA: 08/03/2021  PCP: Pcp, No  Patient coming from: Home.  Chief Complaint: Body aches.  HPI: Jeffrey [name] is a 60 y.o. male with history of ESRD on hemodialysis, diabetes mellitus type 2, hypertension, anemia, left AKA presents to the ER after patient started having muscle body aches for the last 3 days.  Patient missed his dialysis 2 days ago.  Patient states the pain starts in this right foot goes up all the way up to his chest.  Denies any nausea vomiting abdominal pain or diarrhea.  ED Course: In the ER patient blood sugar was found to be in 30s and was started on D10 after patient blood sugar did not improve with D5 and D50.  Chest x-ray unremarkable COVID test was negative.  Labs show BUN of 130 creatinine 3.9 high sensitive troponin of 20 hemoglobin of 12 WBC count was 16.4.  Review of Systems: As per HPI, rest all negative.   Past Medical History:  Diagnosis Date   Arthritis    Blood transfusion without reported diagnosis    CHF (congestive heart failure) (HCC)    COPD (chronic obstructive pulmonary disease) ([place])    Diabetes mellitus without complication ([place])    Hypertension    PTSD (post-traumatic stress disorder)    Renal disorder    Stage 5 Kidney Failure    Past Surgical History:  Procedure Laterality Date   AMPUTATION Left 04/03/2021   Procedure: LEFT ABOVE-THE-KNEE AMPUTATION;  Surgeon: Erle Crocker, MD;  Location: [facility];  Service: Orthopedics;  Laterality: Left;   IR REMOVAL TUN CV CATH W/O FL  03/30/2021   JOINT REPLACEMENT     Bilateral knees     reports that he quit smoking about 10 years ago. His smoking use included cigarettes. He has never used smokeless tobacco. He reports current alcohol use. He reports that he does not currently use drugs.  Allergies  Allergen Reactions   Bupropion Swelling   Dexmedetomidine Nausea And Vomiting    Other reaction(s): Other (See  Comments) Dose-limiting bradycardia Dose-limiting bradycardia    Ibuprofen Shortness Of Breath and Swelling    Pt tolerates aspirin Pt tolerates aspirin    Pioglitazone Anaphylaxis and Rash   Tomato Anaphylaxis    Only allergic to RAW tomatoes    History reviewed. No pertinent family history.  Prior to Admission medications   Medication Sig Start Date End Date Taking? Authorizing Provider  acetaminophen (TYLENOL) 325 MG tablet Take 2 tablets (650 mg total) by mouth every 6 (six) hours as needed for moderate pain. 04/14/21  Yes Sheikh, Omair Latif, DO  albuterol (PROVENTIL) (2.5 MG/3ML) 0.083% nebulizer solution Inhale 2.5 mg into the lungs every 8 (eight) hours as needed for wheezing or shortness of breath.   Yes [provider]  albuterol (VENTOLIN HFA) 108 (90 Base) MCG/ACT inhaler Inhale 1-2 puffs into the lungs every 6 (six) hours as needed for wheezing or shortness of breath.   Yes [provider]  amLODipine (NORVASC) 5 MG tablet Take 5 mg by mouth at bedtime. 04/17/20  Yes [provider]  ASPIRIN LOW DOSE 81 MG EC tablet Take 81 mg by mouth daily. 02/22/21  Yes [provider]  atorvastatin (LIPITOR) 40 MG tablet Take 40 mg by mouth daily. 02/09/21  Yes [provider]  calcium acetate (PHOSLO) 667 MG capsule Take 2 capsules (1,334 mg total) by mouth with breakfast, with lunch, and with evening meal. 04/10/21  Yes Debbe Odea, MD  carvedilol (COREG) 12.5 MG tablet Take 12.5 mg by mouth 2 (two) times daily with a meal. 04/17/20  Yes [provider]  Cholecalciferol 50 MCG (2000 UT) CAPS Take 2,000 Units by mouth daily. 08/24/20  Yes [provider]  Continuous Blood Gluc Receiver (DEXCOM G6 RECEIVER) DEVI Use 1 Device continuously 03/08/21  Yes [provider]  Continuous Blood Gluc Sensor (DEXCOM G6 SENSOR) MISC Use 1 each every 10 (ten) days 03/08/21  Yes [provider]  Continuous Blood Gluc Transmit  (DEXCOM G6 TRANSMITTER) MISC Use 1 each every 3 (three) months 03/08/21  Yes [provider]  Cysteamine Bitartrate (PROCYSBI) 300 MG PACK accucheck guide meter 03/09/20  Yes [provider]  diazepam (VALIUM) 5 MG tablet Take 5 mg by mouth 2 (two) times daily as needed for anxiety. 07/12/21  Yes [provider]  diphenhydrAMINE (BENADRYL) 25 MG tablet Take 50 mg by mouth every 6 (six) hours as needed for allergies.   Yes [provider]  Emollient (OINTMENT BASE) OINT Apply 1 application topically daily as needed (dry skin). 06/23/21 06/23/22 Yes [provider]  gabapentin (NEURONTIN) 300 MG capsule Take 300 mg by mouth at bedtime. 02/22/21  Yes [provider]  glucagon 1 MG injection Inject 1 mg into the muscle as needed (low blood sugar). 06/23/21  Yes [provider]  HYDROmorphone (DILAUDID) 2 MG tablet Take 2 mg by mouth every 6 (six) hours as needed for moderate pain. 07/14/21  Yes [provider]  insulin regular human CONCENTRATED (HUMULIN R U-500 KWIKPEN) 500 UNIT/ML kwikpen Inject 65-120 Units into the skin See admin instructions. 140 units before breakfast and  125 units before dinner on non-dialysis days 70 units bid on dialysis days Monday,Wednesday and friday 09/28/20  Yes [provider]  metolazone (ZAROXOLYN) 10 MG tablet Take 10 mg by mouth daily. 07/13/21  Yes [provider]  nortriptyline (PAMELOR) 10 MG capsule Take 20 mg by mouth at bedtime. 10/23/19 02/22/22 Yes [provider]  Nutritional Supplements (,FEEDING SUPPLEMENT, PROSOURCE PLUS) liquid Take 30 mLs by mouth 2 (two) times daily between meals. 04/11/21  Yes Debbe Odea, MD  oxyCODONE (OXY IR/ROXICODONE) 5 MG immediate release tablet Take 1-2 tablets (5-10 mg total) by mouth every 4 (four) hours as needed for moderate pain. 04/14/21  Yes Sheikh, Omair Latif, DO  pantoprazole (PROTONIX) 20 MG tablet Take 20 mg by mouth daily. 02/09/21   Yes [provider]  polyethylene glycol powder (GLYCOLAX/MIRALAX) 17 GM/SCOOP powder Take 17 g by mouth daily as needed for mild constipation. 08/24/20  Yes [provider]  senna-docusate (SENOKOT-S) 8.6-50 MG tablet Take 2 tablets by mouth 2 (two) times daily as needed for mild constipation. 10/23/19  Yes [provider]  sertraline (ZOLOFT) 100 MG tablet Take 100 mg by mouth daily. 02/22/21  Yes [provider]  torsemide (DEMADEX) 20 MG tablet Take 100 mg by mouth 2 (two) times daily. 06/04/21 06/04/22 Yes [provider]  ACCU-CHEK GUIDE test strip 4 (four) times daily. 11/15/20   [provider]  glucose blood (PRECISION QID TEST) test strip 4 times daily accucheck guide meter 09/28/20   [provider]  vancomycin (VANCOCIN) 1-5 GM/200ML-% SOLN Inject 200 mLs (1,000 mg total) into the vein every Monday, Wednesday, and Friday with hemodialysis. Patient not taking: No sig reported 04/12/21   Debbe Odea, MD    Physical Exam: Constitutional: Moderately built and nourished. Vitals:   08/03/21  2148 08/03/21 2245 08/04/21 0016 08/04/21 0245  BP:  (!) 147/77 (!) 141/54 (!) 141/76  Pulse:  83 81 74  Resp:  '14 19 15  '$ Temp:      TempSrc:      SpO2:  99% 97% 98%  Weight: 125.6 kg     Height: '5\' 5"'$  (1.651 m)      Eyes: Anicteric no pallor. ENMT: No discharge from the ears eyes nose and mouth. Neck: No mass felt.  No neck rigidity. Respiratory: No rhonchi or crepitations. Cardiovascular: S1-S2 heard. Abdomen: Soft nontender bowel sound present. Musculoskeletal: Mild edema. Skin: No rash. Neurologic: Alert awake oriented to time place and person.  Moves all extremities. Psychiatric: Appears normal.  Normal affect.   Labs on Admission: I have personally reviewed following labs and imaging studies  CBC: Recent Labs  Lab 08/04/21 0101 08/04/21 0122  WBC 16.4*  --   NEUTROABS 12.4*  --   HGB 12.0* 13.3  HCT 38.5* 39.0  MCV  87.3  --   PLT 234  --    Basic Metabolic Panel: Recent Labs  Lab 08/04/21 0101 08/04/21 0122  NA 141 141  K 3.1* 3.1*  CL 94* 93*  CO2 32  --   GLUCOSE 26* 26*  BUN 130* >130*  CREATININE 3.93* 4.00*  CALCIUM 8.7*  --   MG 1.7  --    GFR: Estimated Creatinine Clearance: 23.9 mL/min (A) (by C-G formula based on SCr of 4 mg/dL (H)). Liver Function Tests: No results for input(s): AST, ALT, ALKPHOS, BILITOT, PROT, ALBUMIN in the last 168 hours. No results for input(s): LIPASE, AMYLASE in the last 168 hours. No results for input(s): AMMONIA in the last 168 hours. Coagulation Profile: No results for input(s): INR, PROTIME in the last 168 hours. Cardiac Enzymes: No results for input(s): CKTOTAL, CKMB, CKMBINDEX, TROPONINI in the last 168 hours. BNP (last 3 results) No results for input(s): PROBNP in the last 8760 hours. HbA1C: No results for input(s): HGBA1C in the last 72 hours. CBG: No results for input(s): GLUCAP in the last 168 hours. Lipid Profile: No results for input(s): CHOL, HDL, LDLCALC, TRIG, CHOLHDL, LDLDIRECT in the last 72 hours. Thyroid Function Tests: No results for input(s): TSH, T4TOTAL, FREET4, T3FREE, THYROIDAB in the last 72 hours. Anemia Panel: No results for input(s): VITAMINB12, FOLATE, FERRITIN, TIBC, IRON, RETICCTPCT in the last 72 hours. Urine analysis:    Component Value Date/Time   COLORURINE AMBER (A) 03/30/2021 0239   APPEARANCEUR HAZY (A) 03/30/2021 0239   LABSPEC 1.018 03/30/2021 0239   PHURINE 5.0 03/30/2021 0239   GLUCOSEU NEGATIVE 03/30/2021 0239   HGBUR NEGATIVE 03/30/2021 0239   BILIRUBINUR NEGATIVE 03/30/2021 0239   KETONESUR NEGATIVE 03/30/2021 0239   PROTEINUR NEGATIVE 03/30/2021 0239   NITRITE NEGATIVE 03/30/2021 0239   LEUKOCYTESUR NEGATIVE 03/30/2021 0239   Sepsis Labs: '@LABRCNTIP'$ (procalcitonin:4,lacticidven:4) ) Recent Results (from the past 240 hour(s))  Resp Panel by RT-PCR (Flu A&B, Covid) Nasopharyngeal Swab      Status: None   Collection Time: 08/04/21  2:16 AM   Specimen: Nasopharyngeal Swab; Nasopharyngeal(NP) swabs in vial transport medium  Result Value Ref Range Status   SARS Coronavirus 2 by RT PCR NEGATIVE NEGATIVE Final    Comment: (NOTE) SARS-CoV-2 target nucleic acids are NOT DETECTED.  The SARS-CoV-2 RNA is generally detectable in upper respiratory specimens during the acute phase of infection. The lowest concentration of SARS-CoV-2 viral copies this assay can detect is 138 copies/mL. A negative result does not  preclude SARS-Cov-2 infection and should not be used as the sole basis for treatment or other patient management decisions. A negative result may occur with  improper specimen collection/handling, submission of specimen other than nasopharyngeal swab, presence of viral mutation(s) within the areas targeted by this assay, and inadequate number of viral copies(<138 copies/mL). A negative result must be combined with clinical observations, patient history, and epidemiological information. The expected result is Negative.  Fact Sheet for Patients:  EntrepreneurPulse.com.au  Fact Sheet for Healthcare Providers:  IncredibleEmployment.be  This test is no t yet approved or cleared by the [place] FDA and  has been authorized for detection and/or diagnosis of SARS-CoV-2 by FDA under an Emergency Use Authorization (EUA). This EUA will remain  in effect (meaning this test can be used) for the duration of the COVID-19 declaration under Section 564(b)(1) of the Act, 21 U.S.C.section 360bbb-3(b)(1), unless the authorization is terminated  or revoked sooner.       Influenza A by PCR NEGATIVE NEGATIVE Final   Influenza B by PCR NEGATIVE NEGATIVE Final    Comment: (NOTE) The Xpert Xpress SARS-CoV-2/FLU/RSV plus assay is intended as an aid in the diagnosis of influenza from Nasopharyngeal swab specimens and should not be used as a sole basis  for treatment. Nasal washings and aspirates are unacceptable for Xpert Xpress SARS-CoV-2/FLU/RSV testing.  Fact Sheet for Patients: EntrepreneurPulse.com.au  Fact Sheet for Healthcare Providers: IncredibleEmployment.be  This test is not yet approved or cleared by the [place] FDA and has been authorized for detection and/or diagnosis of SARS-CoV-2 by FDA under an Emergency Use Authorization (EUA). This EUA will remain in effect (meaning this test can be used) for the duration of the COVID-19 declaration under Section 564(b)(1) of the Act, 21 U.S.C. section 360bbb-3(b)(1), unless the authorization is terminated or revoked.  Performed at [facility], [place] [address], [place], [place] [address]      Radiological Exams on Admission: DG Ankle Complete Right  Result Date: 08/03/2021 CLINICAL DATA:  Right ankle pain EXAM: RIGHT ANKLE - COMPLETE 3+ VIEW COMPARISON:  None. FINDINGS: There is mild widening of the lateral tibiotalar joint space which may reflect the sequela of a ligamentous injury involving the lateral ligaments. There is no associated fracture or frank dislocation. No ankle effusion. Tiny plantar calcaneal spur. Mild bimalleolar soft tissue swelling. IMPRESSION: Mild widening of the lateral joint space which may reflect the sequela of ligamentous instability or injury. Mild bimalleolar soft tissue swelling. Electronically Signed   By: Fidela Salisbury M.D.   On: 08/03/2021 23:03   DG Chest Portable 1 View  Result Date: 08/04/2021 CLINICAL DATA:  Missed dialysis.  Chest pain EXAM: PORTABLE CHEST 1 VIEW COMPARISON:  03/28/2021 FINDINGS: Left dialysis catheter in place with the tip in the SVC. Heart and mediastinal contours are within normal limits. No focal opacities or effusions. No acute bony abnormality. IMPRESSION: No active cardiopulmonary disease. Electronically Signed   By: Rolm Baptise M.D.   On: 08/04/2021 02:46    EKG:  Independently reviewed.  Normal sinus rhythm.  Assessment/Plan Principal Problem:   Hypoglycemia Active Problems:   ESRD (end stage renal disease) (HCC)   COPD (chronic obstructive pulmonary disease) (HCC)   Hypertension complicating diabetes ([place])    Hypoglycemia -patient states he is no nausea vomiting and has been eating well.  Cause of hypoglycemia not clear.  Presently on D10.  Will check CBGs every hourly and get hemoglobin A1c.  Once blood sugars are stable we will  change to CBG every 4 and discontinue the D10.  Patient usually takes large doses of insulin insulin 500 100 units twice daily. Muscle body aches starting from the foot upwards all the way up to the chest.  Cause not clear.  Could be from missing dialysis and uremia. Leukocytosis -patient is afebrile.  We will get blood cultures. ESRD on hemodialysis on Monday Wednesday Friday did miss his dialysis on Monday.  Consult nephrology. Anemia secondary to ESRD follow CBC. Hypertension on Coreg.   DVT prophylaxis: Heparin. Code Status: Full code. Family Communication: Discussed with patient. Disposition Plan: Home. Consults called: Will need to consult nephrology. Admission status: Observation.   Rise Patience MD [facility] Pager [phone].  If 7PM-7AM, please contact night-coverage [email or web address] Password [email or web address]  08/04/2021, 5:08 AM

## 2021-08-04 NOTE — Progress Notes (Signed)
Inpatient Diabetes Program Recommendations  AACE/ADA: New Consensus Statement on Inpatient Glycemic Control (2015)  Target Ranges:  Prepandial:   less than 140 mg/dL      Peak postprandial:   less than 180 mg/dL (1-2 hours)      Critically ill patients:  140 - 180 mg/dL   Lab Results  Component Value Date   GLUCAP 156 (H) 08/04/2021   HGBA1C 9.5 (H) 08/04/2021    Review of Glycemic Control Results for KAMONI, DEMOTT (MRN [id number]) as of 08/04/2021 09:54  Ref. Range 08/04/2021 05:50 08/04/2021 07:38  Glucose-Capillary Latest Ref Range: 70 - 99 mg/dL 145 (H) 156 (H)   Diabetes history: DM2 Outpatient Diabetes medications:  U500 insulin  Non-Dialysis days- U500-130 units in the mornings and 135 units in the evening Dialysis days- U500-75 units in the morning and 125 units in the evening  Current orders for Inpatient glycemic control:  Novolog very sensitive 0-6 units tid with meals  Inpatient Diabetes Program Recommendations:    Note that patient admitted with low blood sugars.  A1C indicates average blood sugars are 225 mg/dL. Agree with the addition of Novolog.  May need the addition of basal insulin and bolus/meal coverage as patient was taking large doses of U500 at home.  Will follow.   Thanks,  Adah Perl, RN, BC-ADM Inpatient Diabetes Coordinator Pager [phone] (8a-5p)

## 2021-08-04 NOTE — Procedures (Signed)
   I was present at this dialysis session, have reviewed the session itself and made  appropriate changes Kelly Splinter MD [place] pager [phone]   08/04/2021, 4:37 PM

## 2021-08-04 NOTE — Progress Notes (Signed)
Asked to see patient for dialysis/ ESRD.  Pt on HD in [place] MWF. Missed HD Monday. Originally was on HD in [place] but living w/ his dtr and getting HD here short-term. Pt says his RUA graft was put in 6 mos ago at [facility] but could not be stuck because it's "too far under his arm".  Per the pt [facility] "won't do anything about it" so he is supposed to see [facility] here in [place].  On exam today no bruit over the AVG RUA.  Labs okay except for BUN 100, no vol overload or resp issues. Will ask [facility] to look at his AVG.  Will plan HD later today /tonight or at latest in am tomorrow using the [facility] TDC.  If pt becomes full admit will do formal consult.         OP HD: MWF [facility]    3h 78mn  450/ 1.5  124.5kg  2/2.5 bath  [facility] TDC/  RUA AVG - not in use   - venofer '100mg'$  x 1 , rec'd only 1 dose on 07/30/21  RKelly Splinter MD 08/04/2021, 11:38 AM

## 2021-08-05 ENCOUNTER — Inpatient Hospital Stay (HOSPITAL_COMMUNITY): Payer: Medicare Other

## 2021-08-05 DIAGNOSIS — E785 Hyperlipidemia, unspecified: Secondary | ICD-10-CM | POA: Diagnosis present

## 2021-08-05 DIAGNOSIS — M65871 Other synovitis and tenosynovitis, right ankle and foot: Secondary | ICD-10-CM | POA: Diagnosis present

## 2021-08-05 DIAGNOSIS — T82898A Other specified complication of vascular prosthetic devices, implants and grafts, initial encounter: Secondary | ICD-10-CM | POA: Diagnosis not present

## 2021-08-05 DIAGNOSIS — G834 Cauda equina syndrome: Secondary | ICD-10-CM | POA: Diagnosis present

## 2021-08-05 DIAGNOSIS — F39 Unspecified mood [affective] disorder: Secondary | ICD-10-CM | POA: Diagnosis present

## 2021-08-05 DIAGNOSIS — N186 End stage renal disease: Secondary | ICD-10-CM | POA: Diagnosis present

## 2021-08-05 DIAGNOSIS — T82590A Other mechanical complication of surgically created arteriovenous fistula, initial encounter: Secondary | ICD-10-CM | POA: Diagnosis not present

## 2021-08-05 DIAGNOSIS — E1142 Type 2 diabetes mellitus with diabetic polyneuropathy: Secondary | ICD-10-CM | POA: Diagnosis present

## 2021-08-05 DIAGNOSIS — E1165 Type 2 diabetes mellitus with hyperglycemia: Secondary | ICD-10-CM | POA: Diagnosis not present

## 2021-08-05 DIAGNOSIS — Z20822 Contact with and (suspected) exposure to covid-19: Secondary | ICD-10-CM | POA: Diagnosis present

## 2021-08-05 DIAGNOSIS — G546 Phantom limb syndrome with pain: Secondary | ICD-10-CM | POA: Diagnosis present

## 2021-08-05 DIAGNOSIS — S93491A Sprain of other ligament of right ankle, initial encounter: Secondary | ICD-10-CM | POA: Diagnosis present

## 2021-08-05 DIAGNOSIS — E162 Hypoglycemia, unspecified: Secondary | ICD-10-CM | POA: Diagnosis present

## 2021-08-05 DIAGNOSIS — Z9115 Patient's noncompliance with renal dialysis: Secondary | ICD-10-CM | POA: Diagnosis not present

## 2021-08-05 DIAGNOSIS — X58XXXA Exposure to other specified factors, initial encounter: Secondary | ICD-10-CM | POA: Diagnosis present

## 2021-08-05 DIAGNOSIS — Y828 Other medical devices associated with adverse incidents: Secondary | ICD-10-CM | POA: Diagnosis not present

## 2021-08-05 DIAGNOSIS — E1122 Type 2 diabetes mellitus with diabetic chronic kidney disease: Secondary | ICD-10-CM | POA: Diagnosis present

## 2021-08-05 DIAGNOSIS — Z8616 Personal history of COVID-19: Secondary | ICD-10-CM | POA: Diagnosis not present

## 2021-08-05 DIAGNOSIS — Z992 Dependence on renal dialysis: Secondary | ICD-10-CM | POA: Diagnosis not present

## 2021-08-05 DIAGNOSIS — M19071 Primary osteoarthritis, right ankle and foot: Secondary | ICD-10-CM | POA: Diagnosis present

## 2021-08-05 DIAGNOSIS — I152 Hypertension secondary to endocrine disorders: Secondary | ICD-10-CM | POA: Diagnosis not present

## 2021-08-05 DIAGNOSIS — I120 Hypertensive chronic kidney disease with stage 5 chronic kidney disease or end stage renal disease: Secondary | ICD-10-CM | POA: Diagnosis not present

## 2021-08-05 DIAGNOSIS — I132 Hypertensive heart and chronic kidney disease with heart failure and with stage 5 chronic kidney disease, or end stage renal disease: Secondary | ICD-10-CM | POA: Diagnosis present

## 2021-08-05 DIAGNOSIS — D631 Anemia in chronic kidney disease: Secondary | ICD-10-CM | POA: Diagnosis present

## 2021-08-05 DIAGNOSIS — Z89612 Acquired absence of left leg above knee: Secondary | ICD-10-CM | POA: Diagnosis not present

## 2021-08-05 DIAGNOSIS — J449 Chronic obstructive pulmonary disease, unspecified: Secondary | ICD-10-CM | POA: Diagnosis present

## 2021-08-05 DIAGNOSIS — I5032 Chronic diastolic (congestive) heart failure: Secondary | ICD-10-CM | POA: Diagnosis present

## 2021-08-05 DIAGNOSIS — Z6841 Body Mass Index (BMI) 40.0 and over, adult: Secondary | ICD-10-CM | POA: Diagnosis not present

## 2021-08-05 DIAGNOSIS — E1159 Type 2 diabetes mellitus with other circulatory complications: Secondary | ICD-10-CM | POA: Diagnosis not present

## 2021-08-05 DIAGNOSIS — E11649 Type 2 diabetes mellitus with hypoglycemia without coma: Secondary | ICD-10-CM | POA: Diagnosis present

## 2021-08-05 LAB — GLUCOSE, CAPILLARY
Glucose-Capillary: 445 mg/dL — ABNORMAL HIGH (ref 70–99)
Glucose-Capillary: 429 mg/dL — ABNORMAL HIGH (ref 70–99)
Glucose-Capillary: 467 mg/dL — ABNORMAL HIGH (ref 70–99)
Glucose-Capillary: 226 mg/dL — ABNORMAL HIGH (ref 70–99)
Glucose-Capillary: 196 mg/dL — ABNORMAL HIGH (ref 70–99)

## 2021-08-05 LAB — MRSA NEXT GEN BY PCR, NASAL: MRSA by PCR Next Gen: DETECTED — AB

## 2021-08-05 MED ORDER — HYDROMORPHONE HCL 1 MG/ML IJ SOLN
1.0000 mg | Freq: Once | INTRAMUSCULAR | Status: AC
  Administered 2021-08-05: 1 mg via INTRAVENOUS
  Filled 2021-08-05: qty 1

## 2021-08-05 MED ORDER — HYDROMORPHONE HCL 2 MG PO TABS
4.0000 mg | ORAL_TABLET | Freq: Four times a day (QID) | ORAL | Status: DC | PRN
  Administered 2021-08-05 – 2021-08-17 (×9): 4 mg via ORAL
  Filled 2021-08-05 (×11): qty 2

## 2021-08-05 MED ORDER — MUPIROCIN 2 % EX OINT
TOPICAL_OINTMENT | Freq: Two times a day (BID) | CUTANEOUS | Status: DC
  Filled 2021-08-05 (×2): qty 22

## 2021-08-05 MED ORDER — INSULIN ASPART 100 UNIT/ML IJ SOLN
0.0000 [IU] | Freq: Every day | INTRAMUSCULAR | Status: DC
  Administered 2021-08-06 – 2021-08-07 (×2): 3 [IU] via SUBCUTANEOUS
  Administered 2021-08-09: 5 [IU] via SUBCUTANEOUS

## 2021-08-05 MED ORDER — INSULIN REGULAR HUMAN (CONC) 500 UNIT/ML ~~LOC~~ SOPN
60.0000 [IU] | PEN_INJECTOR | Freq: Two times a day (BID) | SUBCUTANEOUS | Status: DC
  Administered 2021-08-05: 60 [IU] via SUBCUTANEOUS
  Filled 2021-08-05 (×2): qty 3

## 2021-08-05 MED ORDER — HYDROMORPHONE HCL 1 MG/ML IJ SOLN
1.0000 mg | Freq: Once | INTRAMUSCULAR | Status: AC | PRN
  Administered 2021-08-05: 1 mg via INTRAVENOUS
  Filled 2021-08-05: qty 1

## 2021-08-05 MED ORDER — INSULIN ASPART 100 UNIT/ML IJ SOLN
0.0000 [IU] | Freq: Three times a day (TID) | INTRAMUSCULAR | Status: DC
  Administered 2021-08-05 (×2): 15 [IU] via SUBCUTANEOUS
  Administered 2021-08-05: 5 [IU] via SUBCUTANEOUS
  Administered 2021-08-06: 3 [IU] via SUBCUTANEOUS
  Administered 2021-08-06: 11 [IU] via SUBCUTANEOUS
  Administered 2021-08-07: 8 [IU] via SUBCUTANEOUS
  Administered 2021-08-07: 15 [IU] via SUBCUTANEOUS
  Administered 2021-08-07 – 2021-08-08 (×2): 11 [IU] via SUBCUTANEOUS
  Administered 2021-08-08: 5 [IU] via SUBCUTANEOUS
  Administered 2021-08-08: 11 [IU] via SUBCUTANEOUS
  Administered 2021-08-09 (×2): 8 [IU] via SUBCUTANEOUS
  Administered 2021-08-09: 15 [IU] via SUBCUTANEOUS

## 2021-08-05 MED ORDER — INSULIN REGULAR HUMAN (CONC) 500 UNIT/ML ~~LOC~~ SOPN
100.0000 [IU] | PEN_INJECTOR | Freq: Two times a day (BID) | SUBCUTANEOUS | Status: DC
  Administered 2021-08-05: 100 [IU] via SUBCUTANEOUS
  Filled 2021-08-05: qty 3

## 2021-08-05 MED ORDER — MAGNESIUM SULFATE 2 GM/50ML IV SOLN
2.0000 g | Freq: Once | INTRAVENOUS | Status: AC
  Administered 2021-08-05: 2 g via INTRAVENOUS
  Filled 2021-08-05: qty 50

## 2021-08-05 MED ORDER — HYDROMORPHONE HCL 1 MG/ML IJ SOLN
0.5000 mg | INTRAMUSCULAR | Status: DC | PRN
  Administered 2021-08-05 – 2021-08-17 (×45): 0.5 mg via INTRAVENOUS
  Filled 2021-08-05 (×6): qty 1
  Filled 2021-08-05: qty 0.5
  Filled 2021-08-05 (×41): qty 1

## 2021-08-05 MED ORDER — INSULIN REGULAR HUMAN (CONC) 500 UNIT/ML ~~LOC~~ SOPN
80.0000 [IU] | PEN_INJECTOR | Freq: Two times a day (BID) | SUBCUTANEOUS | Status: DC
  Filled 2021-08-05: qty 3

## 2021-08-05 NOTE — Progress Notes (Signed)
Call received from lab, patient's nasal swab positive for MRSA.  Order placed for bactroban BID, CHG wipes already ordered.

## 2021-08-05 NOTE — Progress Notes (Signed)
CPAP set up for patient and at bedside within patients reach. Patient stated he was about to have a bath and he would put it on himself afterwards. Advised patient if he needed any assistance to call.

## 2021-08-05 NOTE — Progress Notes (Signed)
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  Presents presents with complaints of worsening pain on his right ankle.  He missed his hemodialysis secondary to this.  Was also hypoglycemic on arrival. Currently plan is further work-up for ankle pain and pain control.  Subjective: No nausea no vomiting.  Ongoing pain to right hand and right ankle.  No chest pain or chest heaviness.  No diarrhea.  No fever no chills.  Assessment and Plan: 1.  Hypoglycemia Likely from poor p.o. intake due to limited mobility and ongoing use of insulin. Patient was initially given D10. Currently improving. Back on U 500, will be using 100 units twice daily for now on non HD days. 70 units twice daily on HD days.  2.  Right ankle pain Most likely associated with worsening arthritis in patient who has left AKA. Will check MRI for further work-up given findings on x-ray. Will require orthopedic input. Not a good candidate for intra-articular joint injection given his need for rotator cuff surgery which will be delayed for 3 months if he receives intra-articular steroids.  3.  ESRD on HD Continue HD MWF. Appreciate nephrology consultation.  4.  Type 2 diabetes mellitus, uncontrolled with hyperglycemia with renal complication with long-term insulin use Patient is on U500 at home.  Significantly high dose of that as well. For now we will try to avoid hypoglycemia and therefore use lower than home regimen dose.  Monitor  5.  Leukocytosis Likely pain reaction. Monitor.  6.  HTN On Coreg.  As well as Norvasc.  Monitor.  HLD. Continue statin. Neuropathy. On gabapentin was increased. Potential muscle spasm. Added Flexeril. Mood disorder. Continue home  regimen. HFpEF. Patient is on diuretics which I will continue. Morbid obesity on OSA.  Continue CPAP.  Scheduled Meds:  amLODipine  5 mg Oral QHS   aspirin EC  81 mg Oral Daily   atorvastatin  40 mg Oral Daily   calcium acetate  1,334 mg Oral TID with meals   carvedilol  12.5 mg Oral BID WC   Chlorhexidine Gluconate Cloth  6 each Topical Q0600   cyclobenzaprine  5 mg Oral TID   gabapentin  300 mg Oral BID   heparin  5,000 Units Subcutaneous Q8H   insulin aspart  0-15 Units Subcutaneous TID WC   insulin aspart  0-5 Units Subcutaneous QHS   insulin regular human CONCENTRATED  100 Units Subcutaneous BID WC   lidocaine  3 patch Transdermal Q24H   metolazone  10 mg Oral Daily   mupirocin ointment   Nasal BID   nortriptyline  20 mg Oral QHS   pantoprazole  20 mg Oral Daily   sertraline  100 mg Oral Daily   torsemide  100 mg Oral BID   Continuous Infusions:  sodium chloride     sodium chloride     magnesium sulfate bolus IVPB 2 g (08/05/21 1759)   PRN Meds: sodium chloride, sodium chloride, acetaminophen **OR** acetaminophen, albuterol, alteplase, diazepam, heparin, HYDROmorphone (DILAUDID) injection, HYDROmorphone, lidocaine (PF), lidocaine-prilocaine, pentafluoroprop-tetrafluoroeth  Body mass index is 46.02 kg/m.        DVT Prophylaxis:   heparin injection 5,000 Units Start: 08/04/21 0600    Advance goals of care  discussion: Pt is Full code.  Family Communication: no family was present at bedside, at the time of interview.   Data Reviewed: I have personally reviewed and interpreted daily labs, tele strips, imaging. CBG elevated.  Physical Exam:  General: Appear in mild distress, no Rash; Oral Mucosa Clear, moist. no Abnormal Neck Mass Or lumps, Conjunctiva normal  Cardiovascular: S1 and S2 Present, no Murmur, Respiratory: good respiratory effort, Bilateral Air entry present and CTA, no Crackles, no wheezes Abdomen: Bowel Sound present, Soft and no  tenderness Extremities: no Pedal edema, left AKA Neurology: alert and oriented to time, place, and person affect appropriate. no new focal deficit Gait not checked due to patient safety concerns   Vitals:   08/05/21 0556 08/05/21 0906 08/05/21 0933 08/05/21 1300  BP: (!) 154/78 (!) 147/79 123/67 (!) 142/66  Pulse: 82  82 78  Resp: '17  17 19  '$ Temp: 98.1 F (36.7 C)  97.8 F (36.6 C) 98.6 F (37 C)  TempSrc: Oral  Axillary Oral  SpO2: 97%  96% 94%  Weight:      Height:       Disposition:  Status is: Inpatient  Remains inpatient appropriate because:IV treatments appropriate due to intensity of illness or inability to take PO  Dispo: The patient is from: Home              Anticipated d/c is to: Home              Patient currently is not medically stable to d/c.   Difficult to place patient No        Time spent: 35 minutes. I reviewed all nursing notes, pharmacy notes, vitals, pertinent old records. I have discussed plan of care as described above with RN.  Author: Berle Mull, MD [facility] 08/05/2021 6:05 PM  To reach On-call, see care teams to locate the attending and reach out via [email or web address]. Between 7PM-7AM, please contact night-coverage If you still have difficulty reaching the attending provider, please page the [facility] (Director on Call) for [facility] on amion for assistance.

## 2021-08-05 NOTE — Consult Note (Signed)
Renal Service Consult Note [facility]  Olice Passman 08/05/2021 Sol Blazing, MD Requesting Physician: Dr Posey Pronto  Reason for Consult: ESRD pt w/ leg pains HPI: The patient is a 60 y.o. year-old w/ hx of COPD, obesity, DM2, HTN, ESRD on HD presented to ED on 08/03/21 w/ diffuse muscle aches starting in the R foot/ leg and moving up to the chest. No n/v/diarrhea or fever. Pt seen in ED, BS was in the 30's, pt started on IV D10. CXR neg, COVID neg. BUN 130 Cr 3.9, trop 20, Hb 12, WBC 16.  Pt was admitted for hypoglycemia and given D10 drip.  Pt takes large doses of insulin at home.  Asked to see for ESRD.    Pt seen in room. Pt on HD in [place] MWF. Missed HD Monday. Originally was on HD in [place] but living w/ his dtr here in [place] now and getting HD here short-term. Pt says his RUA graft was put in 6 mos ago at [facility] but could not be stuck because it's "too far under his arm".  Per the pt [facility] "won't do anything about it" so he is supposed to see [facility] here in [place].   Pt also c/o "cramping" all over his body during HD sessions. He is not sure if related to fluid gains or not.    ROS - denies CP, no joint pain, no HA, no blurry vision, no rash, no diarrhea, no nausea/ vomiting, no dysuria, no difficulty voiding   Past Medical History  Past Medical History:  Diagnosis Date   Arthritis    Blood transfusion without reported diagnosis    CHF (congestive heart failure) (HCC)    COPD (chronic obstructive pulmonary disease) ([place])    Diabetes mellitus without complication ([place])    Hypertension    PTSD (post-traumatic stress disorder)    Renal disorder    Stage 5 Kidney Failure   Past Surgical History  Past Surgical History:  Procedure Laterality Date   AMPUTATION Left 04/03/2021   Procedure: LEFT ABOVE-THE-KNEE AMPUTATION;  Surgeon: Erle Crocker, MD;  Location: [facility];  Service: Orthopedics;  Laterality: Left;   IR REMOVAL TUN CV CATH W/O FL  03/30/2021   JOINT REPLACEMENT      Bilateral knees   Family History History reviewed. No pertinent family history. Social History  reports that he quit smoking about 10 years ago. His smoking use included cigarettes. He has never used smokeless tobacco. He reports current alcohol use. He reports that he does not currently use drugs. Allergies  Allergies  Allergen Reactions   Bupropion Swelling   Dexmedetomidine Nausea And Vomiting    Other reaction(s): Other (See Comments) Dose-limiting bradycardia Dose-limiting bradycardia    Ibuprofen Shortness Of Breath and Swelling    Pt tolerates aspirin Pt tolerates aspirin    Pioglitazone Anaphylaxis and Rash   Tomato Anaphylaxis    Only allergic to RAW tomatoes   Home medications Prior to Admission medications   Medication Sig Start Date End Date Taking? Authorizing Provider  acetaminophen (TYLENOL) 325 MG tablet Take 2 tablets (650 mg total) by mouth every 6 (six) hours as needed for moderate pain. 04/14/21  Yes Sheikh, Omair Latif, DO  albuterol (PROVENTIL) (2.5 MG/3ML) 0.083% nebulizer solution Inhale 2.5 mg into the lungs every 8 (eight) hours as needed for wheezing or shortness of breath.   Yes [provider]  albuterol (VENTOLIN HFA) 108 (90 Base) MCG/ACT inhaler Inhale 1-2 puffs into the lungs every 6 (six)  hours as needed for wheezing or shortness of breath.   Yes [provider]  amLODipine (NORVASC) 5 MG tablet Take 5 mg by mouth at bedtime. 04/17/20  Yes [provider]  ASPIRIN LOW DOSE 81 MG EC tablet Take 81 mg by mouth daily. 02/22/21  Yes [provider]  atorvastatin (LIPITOR) 40 MG tablet Take 40 mg by mouth daily. 02/09/21  Yes [provider]  calcium acetate (PHOSLO) 667 MG capsule Take 2 capsules (1,334 mg total) by mouth with breakfast, with lunch, and with evening meal. 04/10/21  Yes Debbe Odea, MD  carvedilol (COREG) 12.5 MG tablet Take 12.5 mg by mouth 2 (two) times daily with a meal. 04/17/20  Yes [provider]  Cholecalciferol 50 MCG (2000 UT) CAPS Take 2,000 Units by mouth daily. 08/24/20  Yes [provider]  Continuous Blood Gluc Receiver (DEXCOM G6 RECEIVER) DEVI Use 1 Device continuously 03/08/21  Yes [provider]  Continuous Blood Gluc Sensor (DEXCOM G6 SENSOR) MISC Use 1 each every 10 (ten) days 03/08/21  Yes [provider]  Continuous Blood Gluc Transmit (DEXCOM G6 TRANSMITTER) MISC Use 1 each every 3 (three) months 03/08/21  Yes [provider]  Cysteamine Bitartrate (PROCYSBI) 300 MG PACK accucheck guide meter 03/09/20  Yes [provider]  diazepam (VALIUM) 5 MG tablet Take 5 mg by mouth 2 (two) times daily as needed for anxiety. 07/12/21  Yes [provider]  diphenhydrAMINE (BENADRYL) 25 MG tablet Take 50 mg by mouth every 6 (six) hours as needed for allergies.   Yes [provider]  Emollient (OINTMENT BASE) OINT Apply 1 application topically daily as needed (dry skin). 06/23/21 06/23/22 Yes [provider]  gabapentin (NEURONTIN) 300 MG capsule Take 300 mg by mouth at bedtime. 02/22/21  Yes [provider]  glucagon 1 MG injection Inject 1 mg into the muscle as needed (low blood sugar). 06/23/21  Yes [provider]  HYDROmorphone (DILAUDID) 2 MG tablet Take 2 mg by mouth every 6 (six) hours as needed for moderate pain. 07/14/21  Yes [provider]  insulin regular human CONCENTRATED (HUMULIN R U-500 KWIKPEN) 500 UNIT/ML kwikpen Inject 65-120 Units into the skin See admin instructions. 140 units before breakfast and  125 units before dinner on non-dialysis days 70 units bid on dialysis days Monday,Wednesday and friday 09/28/20  Yes [provider]  metolazone (ZAROXOLYN) 10 MG tablet Take 10 mg by mouth daily. 07/13/21  Yes [provider]  nortriptyline (PAMELOR) 10 MG capsule Take 20 mg by mouth at bedtime. 10/23/19 02/22/22 Yes [provider]  Nutritional  Supplements (,FEEDING SUPPLEMENT, PROSOURCE PLUS) liquid Take 30 mLs by mouth 2 (two) times daily between meals. 04/11/21  Yes Debbe Odea, MD  oxyCODONE (OXY IR/ROXICODONE) 5 MG immediate release tablet Take 1-2 tablets (5-10 mg total) by mouth every 4 (four) hours as needed for moderate pain. 04/14/21  Yes Sheikh, Omair Latif, DO  pantoprazole (PROTONIX) 20 MG tablet Take 20 mg by mouth daily. 02/09/21  Yes [provider]  polyethylene glycol powder (GLYCOLAX/MIRALAX) 17 GM/SCOOP powder Take 17 g by mouth daily as needed for mild constipation. 08/24/20  Yes [provider]  senna-docusate (SENOKOT-S) 8.6-50 MG tablet Take 2 tablets by mouth 2 (two) times daily as needed for mild constipation. 10/23/19  Yes [provider]  sertraline (ZOLOFT) 100 MG tablet Take 100 mg by mouth daily. 02/22/21  Yes [provider]  torsemide (DEMADEX) 20 MG tablet Take  100 mg by mouth 2 (two) times daily. 06/04/21 06/04/22 Yes [provider]  ACCU-CHEK GUIDE test strip 4 (four) times daily. 11/15/20   [provider]  glucose blood (PRECISION QID TEST) test strip 4 times daily accucheck guide meter 09/28/20   [provider]  vancomycin (VANCOCIN) 1-5 GM/200ML-% SOLN Inject 200 mLs (1,000 mg total) into the vein every Monday, Wednesday, and Friday with hemodialysis. Patient not taking: No sig reported 04/12/21   Debbe Odea, MD     Vitals:   08/05/21 0556 08/05/21 0906 08/05/21 0933 08/05/21 1300  BP: (!) 154/78 (!) 147/79 123/67 (!) 142/66  Pulse: 82  82 78  Resp: '17  17 19  '$ Temp: 98.1 F (36.7 C)  97.8 F (36.6 C) 98.6 F (37 C)  TempSrc: Oral  Axillary Oral  SpO2: 97%  96% 94%  Weight:      Height:       Exam Gen alert, no distress No rash, cyanosis or gangrene Sclera anicteric, throat clear  No jvd or bruits Chest clear bilat to bases, no rales/ wheezing RRR no MRG Abd obese, soft ntnd no mass or ascites +bs GU normal MS no joint  effusions or deformity Ext no LE/ UE edema, L AKA, no wounds or ulcers Neuro is alert, Ox 3 , nf     Home meds include norvasc, lipitor, asa, phoslo, coreg 12.5 bid/ valium prn, neurontin, dilaudid prn, insulin regular, zaroxolyn, pamelor, oxy IR prn, protonix, zoloft, demadex 100 bid     OP HD:  MWF [facility]    3h 22mn  450/ 1.5  124.5kg  2/2.5 bath  [facility] TDC/ (RUA AVG - not in use)   - venofer '100mg'$  x 1 , rec'd only 1 dose on 07/30/21     Assessment/ Plan: Hypoglycemia - per pmd, is on high dose insulin at home. Per pmd [place]  [place]    Cramping - pt has multiple c/o's about pain in his R leg (hx L AKA), shooting pains up into his chest into his L chest HD catheter. Not sure cause of this, the Ca++ level is wnl. Mg is low, will give 2gm bolus. He is euvolemic on exam. He is taking large doses of loop diuretics and also metolazone, and is voiding a lot , 1400 cc yesterday. Wonder if the diuretics are contributing to the cramping? Suspect the low Mg is due to the diuretics.  BP/ volume - not on any BP lowering meds. 1kg over dry ^[facility]- w/o fever.  Plain films of R foot were negative.  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

## 2021-08-05 NOTE — Progress Notes (Signed)
Inpatient Diabetes Program Recommendations  AACE/ADA: New Consensus Statement on Inpatient Glycemic Control (2015)  Target Ranges:  Prepandial:   less than 140 mg/dL      Peak postprandial:   less than 180 mg/dL (1-2 hours)      Critically ill patients:  140 - 180 mg/dL   Lab Results  Component Value Date   GLUCAP 429 (H) 08/05/2021   HGBA1C 9.5 (H) 08/04/2021    Review of Glycemic Control Results for Jeffrey [name], Jeffrey [name] (MRN [id number]) as of 08/05/2021 10:22  Ref. Range 08/04/2021 23:27 08/05/2021 05:54 08/05/2021 08:34  Glucose-Capillary Latest Ref Range: 70 - 99 mg/dL 447 (H) 445 (H) 429 (H)   Diabetes history:  DM 2 Outpatient Diabetes medications:  U500 insulin  Non-Dialysis days- U500-130 units in the mornings and 135 units in the evening Dialysis days- U500-75 units in the morning and 125 units in the evening Current orders for Inpatient glycemic control:  Novolog moderate tid with meals and HS U500 60 units bid Inpatient Diabetes Program Recommendations:   Agree with restart of U500 insulin.  Will follow.   Thanks,  Adah Perl, RN, BC-ADM Inpatient Diabetes Coordinator Pager [phone]  (8a-5p)

## 2021-08-06 DIAGNOSIS — E162 Hypoglycemia, unspecified: Secondary | ICD-10-CM | POA: Diagnosis not present

## 2021-08-06 LAB — RENAL FUNCTION PANEL
Albumin: 4.2 g/dL (ref 3.5–5.0)
Anion gap: 15 (ref 5–15)
BUN: 57 mg/dL — ABNORMAL HIGH (ref 8–23)
CO2: 26 mmol/L (ref 22–32)
Calcium: 9 mg/dL (ref 8.9–10.3)
Chloride: 91 mmol/L — ABNORMAL LOW (ref 98–111)
Creatinine, Ser: 2.56 mg/dL — ABNORMAL HIGH (ref 0.61–1.24)
GFR, Estimated: 28 mL/min — ABNORMAL LOW (ref >60)
Glucose, Bld: 230 mg/dL — ABNORMAL HIGH (ref 70–99)
Phosphorus: 3.3 mg/dL (ref 2.5–4.6)
Potassium: 3.3 mmol/L — ABNORMAL LOW (ref 3.5–5.1)
Sodium: 132 mmol/L — ABNORMAL LOW (ref 135–145)

## 2021-08-06 LAB — CBC
HCT: 39.5 % (ref 39.0–52.0)
Hemoglobin: 12.8 g/dL — ABNORMAL LOW (ref 13.0–17.0)
MCH: 27.8 pg (ref 26.0–34.0)
MCHC: 32.4 g/dL (ref 30.0–36.0)
MCV: 85.9 fL (ref 80.0–100.0)
Platelets: 182 10*3/uL (ref 150–400)
RBC: 4.6 MIL/uL (ref 4.22–5.81)
RDW: 15 % (ref 11.5–15.5)
WBC: 12.2 10*3/uL — ABNORMAL HIGH (ref 4.0–10.5)
nRBC: 0 % (ref 0.0–0.2)

## 2021-08-06 LAB — GLUCOSE, CAPILLARY
Glucose-Capillary: 51 mg/dL — ABNORMAL LOW (ref 70–99)
Glucose-Capillary: 156 mg/dL — ABNORMAL HIGH (ref 70–99)
Glucose-Capillary: 314 mg/dL — ABNORMAL HIGH (ref 70–99)
Glucose-Capillary: 398 mg/dL — ABNORMAL HIGH (ref 70–99)

## 2021-08-06 LAB — HEPATITIS B CORE ANTIBODY, TOTAL: Hep B Core Total Ab: NONREACTIVE

## 2021-08-06 LAB — C-REACTIVE PROTEIN: CRP: 4.8 mg/dL — ABNORMAL HIGH (ref <1.0)

## 2021-08-06 LAB — HEPATITIS B SURFACE ANTIGEN: Hepatitis B Surface Ag: NONREACTIVE

## 2021-08-06 LAB — HEPATITIS B SURFACE ANTIBODY,QUALITATIVE: Hep B S Ab: REACTIVE — AB

## 2021-08-06 LAB — SEDIMENTATION RATE: Sed Rate: 78 mm/hr — ABNORMAL HIGH (ref 0–16)

## 2021-08-06 MED ORDER — SODIUM CHLORIDE 0.9 % IV SOLN: 100.0000 mL | INTRAVENOUS | Status: DC | PRN

## 2021-08-06 MED ORDER — COLCHICINE 0.3 MG HALF TABLET
0.3000 mg | ORAL_TABLET | Freq: Once | ORAL | Status: AC
  Administered 2021-08-07: 0.3 mg via ORAL
  Filled 2021-08-06: qty 1

## 2021-08-06 MED ORDER — HEPARIN SODIUM (PORCINE) 1000 UNIT/ML IJ SOLN
2000.0000 [IU] | Freq: Once | INTRAMUSCULAR | Status: AC
  Administered 2021-08-06: 2000 [IU] via INTRAVENOUS

## 2021-08-06 MED ORDER — INSULIN REGULAR HUMAN (CONC) 500 UNIT/ML ~~LOC~~ SOLN: 100.0000 [IU] | SUBCUTANEOUS | Status: DC

## 2021-08-06 MED ORDER — INSULIN REGULAR HUMAN (CONC) 500 UNIT/ML ~~LOC~~ SOPN: 70.0000 [IU] | PEN_INJECTOR | SUBCUTANEOUS | Status: DC

## 2021-08-06 MED ORDER — INSULIN REGULAR HUMAN (CONC) 500 UNIT/ML ~~LOC~~ SOLN: 70.0000 [IU] | SUBCUTANEOUS | Status: DC

## 2021-08-06 MED ORDER — INSULIN REGULAR HUMAN (CONC) 500 UNIT/ML ~~LOC~~ SOLN
50.0000 [IU] | SUBCUTANEOUS | Status: DC
  Administered 2021-08-06: 50 [IU] via SUBCUTANEOUS

## 2021-08-06 MED ORDER — HYDROMORPHONE HCL 1 MG/ML IJ SOLN
0.5000 mg | Freq: Once | INTRAMUSCULAR | Status: AC
Start: 2021-08-06 — End: 2021-08-06
  Administered 2021-08-06: 0.5 mg via INTRAVENOUS

## 2021-08-06 NOTE — Progress Notes (Signed)
[facility] Progress Note   Subjective:    Seen and examined patient during HD. Tolerating UFG 2.5L.   Objective Vitals:   08/05/21 2042 08/05/21 2340 08/06/21 1235 08/06/21 1242  BP: (!) 150/78  136/71 131/63  Pulse: 81 80 92 85  Resp: '18 18 16 16  '$ Temp: 98 F (36.7 C)  98.9 F (37.2 C)   TempSrc: Oral  Oral   SpO2: 98%  100% 100%  Weight:   123.1 kg   Height:       Physical Exam General: Well-appearing; NAD Heart: S1 and S2; No murmur, gallops, or rubs Lungs: CTA; No wheezing, rales, or rhonchi Abdomen: Large, round, non-tender Extremities: No R pedal edema; L AKA Dialysis Access: [facility]   Filed Weights   08/03/21 2148 08/04/21 1637 08/06/21 1235  Weight: 125.6 kg 125.4 kg 123.1 kg    Intake/Output Summary (Last 24 hours) at 08/06/2021 1353 Last data filed at 08/06/2021 1204 Gross per 24 hour  Intake --  Output 2200 ml  Net -2200 ml    Additional Objective Labs: Basic Metabolic Panel: Recent Labs  Lab 08/04/21 0101 08/04/21 0122 08/04/21 0553  NA 141 141  --   K 3.1* 3.1*  --   CL 94* 93*  --   CO2 32  --   --   GLUCOSE 26* 26*  --   BUN 130* >130*  --   CREATININE 3.93* 4.00* 4.07*  CALCIUM 8.7*  --   --    Liver Function Tests: No results for input(s): AST, ALT, ALKPHOS, BILITOT, PROT, ALBUMIN in the last 168 hours. No results for input(s): LIPASE, AMYLASE in the last 168 hours. CBC: Recent Labs  Lab 08/04/21 0101 08/04/21 0122 08/04/21 0553  WBC 16.4*  --  8.9  NEUTROABS 12.4*  --   --   HGB 12.0* 13.3 10.5*  HCT 38.5* 39.0 34.2*  MCV 87.3  --  89.1  PLT 234  --  143*   Blood Culture    Component Value Date/Time   SDES BLOOD LEFT ARM 08/04/2021 1830   SPECREQUEST  08/04/2021 1830    BOTTLES DRAWN AEROBIC AND ANAEROBIC Blood Culture results may not be optimal due to an inadequate volume of blood received in culture bottles   CULT  08/04/2021 1830    NO GROWTH 2 DAYS Performed at [facility], [place] [address], [place], [place] [address]    REPTSTATUS PENDING 08/04/2021 1830    Cardiac Enzymes: No results for input(s): CKTOTAL, CKMB, CKMBINDEX, TROPONINI in the last 168 hours. CBG: Recent Labs  Lab 08/05/21 1106 08/05/21 1654 08/05/21 2039 08/06/21 0751 08/06/21 1203  GLUCAP 467* 226* 196* 51* 156*   Iron Studies: No results for input(s): IRON, TIBC, TRANSFERRIN, FERRITIN in the last 72 hours. Lab Results  Component Value Date   INR 1.2 03/28/2021   Studies/Results: MR ANKLE RIGHT WO CONTRAST  Result Date: 08/05/2021 CLINICAL DATA:  Mild widening of the mortise and soft tissue swelling on ankle radiography of 08/03/2021. Right ankle pain. Prior left above the knee amputation. EXAM: MRI OF THE RIGHT ANKLE WITHOUT CONTRAST TECHNIQUE: Multiplanar, multisequence MR imaging of the ankle was performed. No intravenous contrast was administered. COMPARISON:  Radiographs 08/03/2021 FINDINGS: TENDONS Peroneal: Unremarkable Posteromedial: Mild tibialis posterior tenosynovitis and distal tendinopathy, correlate clinically in assessing for tibialis posterior dysfunction. Mild flexor hallucis longus tenosynovitis just proximal to the knot of Henry. Anterior: Unremarkable Achilles: Unremarkable Plantar Fascia: Mild thickening of the medial band  of the plantar fascia as on image 13 series 8, without substantial surrounding edema. Small plantar calcaneal spur. The thickening may reflect low grade plantar fasciitis. LIGAMENTS Lateral: Thickened but intact anterior inferior tibiofibular ligament. Attenuated anterior talofibular ligament, suspicious for chronic or remote tear, without local synovitis to suggest anterolateral impingement. Calcaneofibular ligament appears intact. Medial: Grossly intact deep tibiotalar portion of the deltoid ligament. Indistinctness of tissue planes along the tibionavicular and tibiospring components of the deltoid ligament. CARTILAGE Ankle Joint: There is slightly more fluid space  laterally in the tibiotalar joint compared to medially for example on image 17 of series 6 although this amounts to less than 2 mm. 0.3 cm focus of subcortical marrow edema posteromedially along the talar dome on image 16 series 6. Subtalar Joints/Sinus Tarsi: Low-level edema laterally along the subtalar joint on image 15 of series 9 may reflect low-grade lateral hindfoot impingement. There is edema in the sinus tarsi such that sinus tarsi syndrome is difficult to exclude. Bones: Osteochondral lesions of the calcaneocuboid joint are noted dorsally for example on image 16 series 7, with notable dorsal spurring. There is dorsal midfoot spurring as well. Other: No supplemental non-categorized findings. IMPRESSION: 1. Attenuated anterior talofibular ligament, likely from chronic or remote tear. This could partially facilitate some of the equivocal widening in the lateral tibiotalar joint space, although the calcaneofibular ligament and posterior talofibular ligament appear intact. 2. Subcortical marrow edema along the lateral subtalar joint on image 15 series 8 raises the possibility of mild lateral hindfoot impingement. 3. Edema in the sinus tarsi, cannot exclude sinus tarsi syndrome. 4. Substantial confluent osteochondral lesions of the dorsal calcaneocuboid joint attributed to arthropathy. 5. Ill definition of tibiospring and tibionavicular portions of the deltoid ligament, potentially chronically injured or sprained. The deep tibiotalar portion appears intact. 6. Mild tibialis posterior tenosynovitis and distal tendinopathy, correlate clinically in assessing for tibialis posterior dysfunction. 7. Mild flexor hallucis longus tenosynovitis. 8. Low-grade plantar fasciitis suspected along the medial band plantar fascia. Electronically Signed   By: Van Clines M.D.   On: 08/05/2021 17:11    Medications:  sodium chloride     sodium chloride     sodium chloride     sodium chloride      amLODipine  5 mg Oral  QHS   aspirin EC  81 mg Oral Daily   atorvastatin  40 mg Oral Daily   calcium acetate  1,334 mg Oral TID with meals   carvedilol  12.5 mg Oral BID WC   Chlorhexidine Gluconate Cloth  6 each Topical Q0600   cyclobenzaprine  5 mg Oral TID   gabapentin  300 mg Oral BID   heparin  5,000 Units Subcutaneous Q8H   insulin aspart  0-15 Units Subcutaneous TID WC   insulin aspart  0-5 Units Subcutaneous QHS   insulin regular human CONCENTRATED  50 Units Subcutaneous 2 times per day on Mon Wed Fri   And   [START ON 08/07/2021] insulin regular human CONCENTRATED  100 Units Subcutaneous 2 times per day on Sun Tue Thu Sat   lidocaine  3 patch Transdermal Q24H   metolazone  10 mg Oral Daily   mupirocin ointment   Nasal BID   nortriptyline  20 mg Oral QHS   pantoprazole  20 mg Oral Daily   sertraline  100 mg Oral Daily   torsemide  100 mg Oral BID    Dialysis Orders: [facility] 3h 44mn  450/ 1.5  124.5kg  2/2.5 bath  [facility] TDC/ (  RUA AVG - not in use) - venofer '100mg'$  x 1 , rec'd only 1 dose on 07/30/21  Assessment/Plan: Hypoglycemia - per pmd, is on high dose insulin at home.  ESRD - on HD MWF in [place].  Was on HD in [place], now living w/ his dtr in [place]. On HD-using TDC.    Cramping - pt has multiple c/o's about pain in his R leg (hx L AKA), shooting pains up into his chest into his L chest HD catheter. Ca++ level is wnl. Mg 1.7-2gm bolus given. He is taking large doses of loop diuretics and also metolazone, and is voiding a lot , 1400 cc yesterday. Wonder if the diuretics are contributing to the cramping? Suspect the low Mg is due to the diuretics.  BP/ volume - not on any BP lowering meds. 1kg over dry. Euvolemic on exam [facility] - w/o fever.  Plain films of R foot were negative. WBC now trending down. 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

## 2021-08-06 NOTE — Progress Notes (Signed)
Inpatient Diabetes Program Recommendations  AACE/ADA: New Consensus Statement on Inpatient Glycemic Control (2015)  Target Ranges:  Prepandial:   less than 140 mg/dL      Peak postprandial:   less than 180 mg/dL (1-2 hours)      Critically ill patients:  140 - 180 mg/dL   Lab Results  Component Value Date   GLUCAP 51 (L) 08/06/2021   HGBA1C 9.5 (H) 08/04/2021    Review of Glycemic Control Results for Jeffrey, [name] (MRN [id number]) as of 08/06/2021 10:19  Ref. Range 08/05/2021 08:34 08/05/2021 11:06 08/05/2021 16:54 08/05/2021 20:39 08/06/2021 07:51  Glucose-Capillary Latest Ref Range: 70 - 99 mg/dL 429 (H) 467 (H) 226 (H) 196 (H) 51 (L)    Diabetes history:  DM 2 Outpatient Diabetes medications:  U500 insulin  Non-Dialysis days- U500-130 units in the mornings and 135 units in the evening Dialysis days- U500-75 units in the morning and 125 units in the evening  Current orders for Inpatient glycemic control:  Novolog moderate tid with meals and HS U500 100 units bid (Sun Tue Thu Sat) U500 50 units bid (Mon Wed Fri)  Inpatient Diabetes Program Recommendations:   Hypoglycemia in the 50's this am - Reduce U500 dose from 100 to 80 units bid on Sun Tue Thu Sat  Thanks,  Tama Headings RN, MSN, BC-ADM Inpatient Diabetes Coordinator Team Pager [phone] (8a-5p)

## 2021-08-06 NOTE — Progress Notes (Signed)
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  Presents presents with complaints of worsening pain on his right ankle.  He missed his hemodialysis secondary to this.  Was also hypoglycemic on arrival.  Currently plan is pain control and physical therapy evaluation.  Subjective: Continues to have pain.  No nausea no vomiting.  Was also hypoglycemic with his home regimen of insulin.  Assessment and Plan: 1.  Type 2 diabetes mellitus, uncontrolled with hyperand hypoglycemia with renal complication and long-term insulin use. Glucose fluctuate significantly. Patient's home regimen of U500 appears to be significantly high for him. Will adjust dose and monitor.  2.  Right ankle pain. Ligamental injury. MRI negative for any other acute abnormality. Discussed with orthopedics.  Recommend cam walker boot, weightbearing as tolerated and anti-inflammatory medication. Patient is not a good candidate for NSAIDs given allergy and prednisone given diabetes. Currently agreeable to consider colchicine as a potential option. Will monitor  3.  ESRD on HD Continue HD MWF.  Appreciate their assistance.  4.  HTN Blood pressure stable.  Monitor.  Continue current regimen.  HLD, continue statin. Neuropathy, continue gabapentin.  Dose increased. Muscle spasm, continue Flexeril. Mood disorder.  Continue home regimen. HFpEF. Management per HD as well as diuretics.  Scheduled Meds:  amLODipine  5 mg Oral QHS   aspirin EC  81 mg Oral Daily   atorvastatin  40 mg Oral Daily   calcium acetate  1,334 mg Oral TID with meals   carvedilol  12.5 mg Oral BID WC   Chlorhexidine Gluconate Cloth  6 each Topical Q0600   cyclobenzaprine  5 mg Oral TID   gabapentin  300 mg  Oral BID   heparin  5,000 Units Subcutaneous Q8H   insulin aspart  0-15 Units Subcutaneous TID WC   insulin aspart  0-5 Units Subcutaneous QHS   insulin regular human CONCENTRATED  50 Units Subcutaneous 2 times per day on Mon Wed Fri   And   [START ON 08/07/2021] insulin regular human CONCENTRATED  100 Units Subcutaneous 2 times per day on Sun Tue Thu Sat   lidocaine  3 patch Transdermal Q24H   metolazone  10 mg Oral Daily   mupirocin ointment   Nasal BID   nortriptyline  20 mg Oral QHS   pantoprazole  20 mg Oral Daily   sertraline  100 mg Oral Daily   torsemide  100 mg Oral BID   Continuous Infusions: PRN Meds: acetaminophen **OR** acetaminophen, albuterol, diazepam, HYDROmorphone (DILAUDID) injection, HYDROmorphone  Body mass index is 45.16 kg/m.        DVT Prophylaxis:   heparin injection 5,000 Units Start: 08/04/21 0600    Advance goals of care discussion: Pt is Full code.  Family Communication: no family was present at bedside, at the time of interview.   Data Reviewed: I have personally reviewed and interpreted daily labs, tele strips, imaging. Blood glucose uncontrolled. Potassium level mildly low.  Serum creatinine stable.  WBC mildly elevated.  Physical Exam:  General: Appear in mild distress, no Rash; Oral Mucosa Clear, moist. no Abnormal Neck Mass Or lumps, Conjunctiva normal  Cardiovascular: S1 and S2 Present, no Murmur, Respiratory: good respiratory effort, Bilateral Air entry present and CTA, no  Crackles, no wheezes Abdomen: Bowel Sound present, Soft and no tenderness Extremities: no Pedal edema, left AKA. Neurology: alert and oriented to time, place, and person affect appropriate. no new focal deficit Gait not checked due to patient safety concerns    Vitals:   08/06/21 1500 08/06/21 1530 08/06/21 1600 08/06/21 1702  BP: (!) 143/44 (!) 186/73 (!) 126/48 136/82  Pulse: 89 95 94   Resp:      Temp:    99 F (37.2 C)  TempSrc:    Oral  SpO2:    97%   Weight:      Height:        Disposition:  Status is: Inpatient  Remains inpatient appropriate because:Ongoing active pain requiring inpatient pain management  Dispo: The patient is from: Home              Anticipated d/c is to: Home              Patient currently is not medically stable to d/c.   Difficult to place patient No        Time spent: 35 minutes. I reviewed all nursing notes, pharmacy notes, vitals, pertinent old records. I have discussed plan of care as described above with RN.  Author: Berle Mull, MD [facility] 08/06/2021 8:00 PM  To reach On-call, see care teams to locate the attending and reach out via [email or web address]. Between 7PM-7AM, please contact night-coverage If you still have difficulty reaching the attending provider, please page the [facility] (Director on Call) for [facility] on amion for assistance.

## 2021-08-06 NOTE — Progress Notes (Signed)
RT has patient cpap setup at beside. Patient stated he is able to place himself on CPAP when he is ready no assistance needed.

## 2021-08-07 DIAGNOSIS — E162 Hypoglycemia, unspecified: Secondary | ICD-10-CM | POA: Diagnosis not present

## 2021-08-07 LAB — GLUCOSE, CAPILLARY
Glucose-Capillary: 258 mg/dL — ABNORMAL HIGH (ref 70–99)
Glucose-Capillary: 382 mg/dL — ABNORMAL HIGH (ref 70–99)
Glucose-Capillary: 331 mg/dL — ABNORMAL HIGH (ref 70–99)
Glucose-Capillary: 270 mg/dL — ABNORMAL HIGH (ref 70–99)

## 2021-08-07 LAB — MAGNESIUM: Magnesium: 1.9 mg/dL (ref 1.7–2.4)

## 2021-08-07 MED ORDER — INSULIN REGULAR HUMAN (CONC) 500 UNIT/ML ~~LOC~~ SOLN
80.0000 [IU] | SUBCUTANEOUS | Status: DC
  Administered 2021-08-07 – 2021-08-08 (×3): 80 [IU] via SUBCUTANEOUS
  Filled 2021-08-07: qty 20

## 2021-08-07 MED ORDER — INSULIN REGULAR HUMAN (CONC) 500 UNIT/ML ~~LOC~~ SOLN: 50.0000 [IU] | SUBCUTANEOUS | Status: DC

## 2021-08-07 MED ORDER — SENNOSIDES-DOCUSATE SODIUM 8.6-50 MG PO TABS
1.0000 | ORAL_TABLET | Freq: Two times a day (BID) | ORAL | Status: DC
  Administered 2021-08-07 – 2021-08-17 (×19): 1 via ORAL
  Filled 2021-08-07 (×21): qty 1

## 2021-08-07 MED ORDER — LACTULOSE 10 GM/15ML PO SOLN
30.0000 g | Freq: Two times a day (BID) | ORAL | Status: DC
  Administered 2021-08-07 (×2): 30 g via ORAL
  Filled 2021-08-07 (×3): qty 45

## 2021-08-07 NOTE — Progress Notes (Signed)
Orthopedic Tech Progress Note Patient Details:  Jeffrey [name] 11-21-1961 [id number] Large cam walker was applied to patient's right leg.  Ortho Devices Type of Ortho Device: CAM walker Ortho Device/Splint Location: Right leg Ortho Device/Splint Interventions: Application   Post Interventions Patient Tolerated: Well Instructions Provided: Adjustment of device  Jeffrey [name] E Raushanah Osmundson 08/07/2021, 10:02 AM

## 2021-08-07 NOTE — Evaluation (Signed)
Physical Therapy Evaluation Patient Details Name: Jeffrey [name] MRN: [id number] DOB: [birth year] Today's Date: 08/07/2021   History of Present Illness  60 yo male presnts to [facility] on 9/6 with complaints of R-sided body spasms and L HD cath pain, missed HD on Monday, and hypoglycemia. Also with R ankle pain secondary to ligamental injury, MRI negative.  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  Clinical Impression   Pt presents with impaired strength, R-sided pain which pt describes from his ankle to his R shoulder, fair seated balance, impaired activity tolerance, and inability to transfer to stand during PT session due to severe pain and fear of falling. Pt to benefit from acute PT to address deficits. Pt requiring min assist for bed-level tasks, per RN pt transferred to/from [facility] today with +2-3.  PT to progress mobility as tolerated, and will continue to follow acutely.      Follow Up Recommendations SNF    Equipment Recommendations  None recommended by PT    Recommendations for Other Services       Precautions / Restrictions Precautions Precautions: Fall Precaution Comments: L AKA Restrictions Weight Bearing Restrictions: No      Mobility  Bed Mobility Overal bed mobility: Needs Assistance Bed Mobility: Supine to Sit;Sit to Supine;Rolling Rolling: Supervision   Supine to sit: Supervision;HOB elevated Sit to supine: Min assist   General bed mobility comments: supervision for getting to EOB for safety, very elevated HOB and use of bedrails to perform. Min assist for return to supine for RLE lifting into bed.    Transfers                 General transfer comment: pt declines - states he transferred earlier and it was very painful, declines OOB attempt  Ambulation/Gait             General Gait Details: nt  Stairs            Wheelchair Mobility    Modified Rankin (Stroke Patients Only)       Balance Overall balance  assessment: Needs assistance Sitting-balance support: Single extremity supported Sitting balance-Leahy Scale: Fair Sitting balance - Comments: can sit EOB without PT assist       Standing balance comment: nt                             Pertinent Vitals/Pain Pain Assessment: Faces Faces Pain Scale: Hurts even more Pain Location: R side Pain Descriptors / Indicators: Sore;Discomfort;Grimacing Pain Intervention(s): Limited activity within patient's tolerance;Monitored during session;Repositioned    Home Living Family/patient expects to be discharged to:: Private residence Living Arrangements: Children (daughter lives with him but he works) Available Help at Discharge: Family;Available PRN/intermittently Type of Home: Apartment Home Access: Level entry     Home Layout: One level Home Equipment: Cane - single point;Walker - 4 wheels;Wheelchair - manual;Bedside commode;Walker - 2 wheels      Prior Function Level of Independence: Needs assistance   Gait / Transfers Assistance Needed: pt reports prior to R side hurting, pt was hopping with RW for household distances. Now is mostly transfer level  ADL's / Homemaking Assistance Needed: pt reports doing his own cooking, has an aide come in on Monday and Friday 1 hour/day to help with wash up and dressing.  Comments: Pt reports falling a couple of weeks ago     Hand Dominance   Dominant Hand: Right  Extremity/Trunk Assessment   Upper Extremity Assessment Upper Extremity Assessment: Defer to OT evaluation    Lower Extremity Assessment Lower Extremity Assessment: Generalized weakness;RLE deficits/detail;LLE deficits/detail RLE Deficits / Details: in CAM boot, able to perform SLR with min PT lift assist RLE: Unable to fully assess due to immobilization;Unable to fully assess due to pain LLE Deficits / Details: history of AKA    Cervical / Trunk Assessment Cervical / Trunk Assessment: Normal  Communication    Communication: No difficulties  Cognition Arousal/Alertness: Awake/alert Behavior During Therapy: WFL for tasks assessed/performed Overall Cognitive Status: Within Functional Limits for tasks assessed                                 General Comments: Pt with history of PTSD, is particular. Lacks safety awareness      General Comments      Exercises     Assessment/Plan    PT Assessment Patient needs continued PT services  PT Problem List Decreased strength;Decreased mobility;Decreased safety awareness;Decreased activity tolerance;Decreased balance;Decreased knowledge of use of DME;Pain       PT Treatment Interventions DME instruction;Therapeutic activities;Gait training;Therapeutic exercise;Patient/family education;Balance training;Functional mobility training;Neuromuscular re-education    PT Goals (Current goals can be found in the Care Plan section)  Acute Rehab PT Goals Patient Stated Goal: get better PT Goal Formulation: With patient Time For Goal Achievement: 08/21/21 Potential to Achieve Goals: Good    Frequency Min 2X/week   Barriers to discharge        Co-evaluation               AM-PAC PT "6 Clicks" Mobility  Outcome Measure Help needed turning from your back to your side while in a flat bed without using bedrails?: None Help needed moving from lying on your back to sitting on the side of a flat bed without using bedrails?: A Little Help needed moving to and from a bed to a chair (including a wheelchair)?: Total Help needed standing up from a chair using your arms (e.g., wheelchair or bedside chair)?: Total Help needed to walk in hospital room?: Total Help needed climbing 3-5 steps with a railing? : Total 6 Click Score: 11    End of Session   Activity Tolerance: Patient limited by pain Patient left: in bed;with call bell/phone within reach;with bed alarm set Nurse Communication: Mobility status PT Visit Diagnosis: Other abnormalities  of gait and mobility (R26.89);Pain Pain - Right/Left: Right Pain - part of body: Shoulder;Hip;Leg;Knee;Ankle and joints of foot    Time: [id number] PT Time Calculation (min) (ACUTE ONLY): 13 min   Charges:   PT Evaluation $PT Eval Low Complexity: 1 Low         Karlena Luebke S, PT DPT Acute Rehabilitation Services Pager [phone]  Office [phone]   [place] E Ruffin Pyo 08/07/2021, 1:37 PM

## 2021-08-07 NOTE — Progress Notes (Signed)
[facility] Progress Note   Subjective:   Patient seen and examines at bedside.  Not happy with his breakfast but no other complaints.  Denies CP, SOB, n/v/d, abdominal pain and edema.  Objective Vitals:   08/06/21 1600 08/06/21 1702 08/06/21 2042 08/07/21 0531  BP: (!) 126/48 136/82 (!) 160/81 (!) 153/89  Pulse: 94  98 81  Resp:   19 17  Temp:  99 F (37.2 C) 99 F (37.2 C) 98.1 F (36.7 C)  TempSrc:  Oral Oral Oral  SpO2:  97% 98% 90%  Weight:      Height:       Physical Exam General:well appearing male in NAD, sitting up in bed Heart:RRR, no mrg Lungs:CTAB anterolaterally, nml WOB on RA Abdomen:soft, NTND Extremities:L AKA, no edema on RLE Dialysis Access: [facility]   Filed Weights   08/03/21 2148 08/04/21 1637 08/06/21 1235  Weight: 125.6 kg 125.4 kg 123.1 kg    Intake/Output Summary (Last 24 hours) at 08/07/2021 1357 Last data filed at 08/07/2021 1100 Gross per 24 hour  Intake 480 ml  Output 250 ml  Net 230 ml    Additional Objective Labs: Basic Metabolic Panel: Recent Labs  Lab 08/04/21 0101 08/04/21 0122 08/04/21 0553 08/06/21 1714  NA 141 141  --  132*  K 3.1* 3.1*  --  3.3*  CL 94* 93*  --  91*  CO2 32  --   --  26  GLUCOSE 26* 26*  --  230*  BUN 130* >130*  --  57*  CREATININE 3.93* 4.00* 4.07* 2.56*  CALCIUM 8.7*  --   --  9.0  PHOS  --   --   --  3.3   Liver Function Tests: Recent Labs  Lab 08/06/21 1714  ALBUMIN 4.2    CBC: Recent Labs  Lab 08/04/21 0101 08/04/21 0122 08/04/21 0553 08/06/21 1500  WBC 16.4*  --  8.9 12.2*  NEUTROABS 12.4*  --   --   --   HGB 12.0* 13.3 10.5* 12.8*  HCT 38.5* 39.0 34.2* 39.5  MCV 87.3  --  89.1 85.9  PLT 234  --  143* 182   Blood Culture    Component Value Date/Time   SDES BLOOD LEFT ARM 08/04/2021 1830   SPECREQUEST  08/04/2021 1830    BOTTLES DRAWN AEROBIC AND ANAEROBIC Blood Culture results may not be optimal due to an inadequate volume of blood received in culture bottles    CULT  08/04/2021 1830    NO GROWTH 2 DAYS Performed at [facility], [address], [place], [place] [address]    REPTSTATUS PENDING 08/04/2021 1830    CBG: Recent Labs  Lab 08/06/21 1203 08/06/21 1705 08/06/21 2125 08/07/21 0741 08/07/21 1105  GLUCAP 156* 314* 398* 258* 382*   Studies/Results: MR ANKLE RIGHT WO CONTRAST  Result Date: 08/05/2021 CLINICAL DATA:  Mild widening of the mortise and soft tissue swelling on ankle radiography of 08/03/2021. Right ankle pain. Prior left above the knee amputation. EXAM: MRI OF THE RIGHT ANKLE WITHOUT CONTRAST TECHNIQUE: Multiplanar, multisequence MR imaging of the ankle was performed. No intravenous contrast was administered. COMPARISON:  Radiographs 08/03/2021 FINDINGS: TENDONS Peroneal: Unremarkable Posteromedial: Mild tibialis posterior tenosynovitis and distal tendinopathy, correlate clinically in assessing for tibialis posterior dysfunction. Mild flexor hallucis longus tenosynovitis just proximal to the knot of Henry. Anterior: Unremarkable Achilles: Unremarkable Plantar Fascia: Mild thickening of the medial band of the plantar fascia as on image 13 series 8, without  substantial surrounding edema. Small plantar calcaneal spur. The thickening may reflect low grade plantar fasciitis. LIGAMENTS Lateral: Thickened but intact anterior inferior tibiofibular ligament. Attenuated anterior talofibular ligament, suspicious for chronic or remote tear, without local synovitis to suggest anterolateral impingement. Calcaneofibular ligament appears intact. Medial: Grossly intact deep tibiotalar portion of the deltoid ligament. Indistinctness of tissue planes along the tibionavicular and tibiospring components of the deltoid ligament. CARTILAGE Ankle Joint: There is slightly more fluid space laterally in the tibiotalar joint compared to medially for example on image 17 of series 6 although this amounts to less than 2 mm. 0.3 cm focus of subcortical marrow edema  posteromedially along the talar dome on image 16 series 6. Subtalar Joints/Sinus Tarsi: Low-level edema laterally along the subtalar joint on image 15 of series 9 may reflect low-grade lateral hindfoot impingement. There is edema in the sinus tarsi such that sinus tarsi syndrome is difficult to exclude. Bones: Osteochondral lesions of the calcaneocuboid joint are noted dorsally for example on image 16 series 7, with notable dorsal spurring. There is dorsal midfoot spurring as well. Other: No supplemental non-categorized findings. IMPRESSION: 1. Attenuated anterior talofibular ligament, likely from chronic or remote tear. This could partially facilitate some of the equivocal widening in the lateral tibiotalar joint space, although the calcaneofibular ligament and posterior talofibular ligament appear intact. 2. Subcortical marrow edema along the lateral subtalar joint on image 15 series 8 raises the possibility of mild lateral hindfoot impingement. 3. Edema in the sinus tarsi, cannot exclude sinus tarsi syndrome. 4. Substantial confluent osteochondral lesions of the dorsal calcaneocuboid joint attributed to arthropathy. 5. Ill definition of tibiospring and tibionavicular portions of the deltoid ligament, potentially chronically injured or sprained. The deep tibiotalar portion appears intact. 6. Mild tibialis posterior tenosynovitis and distal tendinopathy, correlate clinically in assessing for tibialis posterior dysfunction. 7. Mild flexor hallucis longus tenosynovitis. 8. Low-grade plantar fasciitis suspected along the medial band plantar fascia. Electronically Signed   By: Van Clines M.D.   On: 08/05/2021 17:11    Medications:   amLODipine  5 mg Oral QHS   aspirin EC  81 mg Oral Daily   atorvastatin  40 mg Oral Daily   calcium acetate  1,334 mg Oral TID with meals   carvedilol  12.5 mg Oral BID WC   Chlorhexidine Gluconate Cloth  6 each Topical Q0600   cyclobenzaprine  5 mg Oral TID   gabapentin   300 mg Oral BID   heparin  5,000 Units Subcutaneous Q8H   insulin aspart  0-15 Units Subcutaneous TID WC   insulin aspart  0-5 Units Subcutaneous QHS   insulin regular human CONCENTRATED  80 Units Subcutaneous 2 times per day on Sun Tue Thu Sat   And   [START ON 08/09/2021] insulin regular human CONCENTRATED  50 Units Subcutaneous 2 times per day on Mon Wed Fri   lactulose  30 g Oral BID   lidocaine  3 patch Transdermal Q24H   metolazone  10 mg Oral Daily   mupirocin ointment   Nasal BID   nortriptyline  20 mg Oral QHS   pantoprazole  20 mg Oral Daily   senna-docusate  1 tablet Oral BID   sertraline  100 mg Oral Daily   torsemide  100 mg Oral BID    Dialysis Orders: [facility] 3h 38mn  450/ 1.5  124.5kg  2/2.5 bath  [facility] TDC/ (RUA AVG - not in use) - venofer '100mg'$  x 1 , rec'd only 1 dose on 07/30/21  Assessment/Plan: Hypoglycemia - per pmd, is on high dose insulin at home.  ESRD - on HD MWF.  Was on HD in [place], now living w/ his dtr in [place]. Next HD on 08/09/21.   Cramping?Left leg pain - pt has multiple c/o's about pain in his R leg (hx L AKA), shooting pains up into his chest into his L chest HD catheter. Ca++ level is wnl. Mg 1.7-2gm bolus given. He is taking large doses of loop diuretics and also metolazone, and id voiding large amounts daily.  Diuretics could be contributing to cramping.  Suspect the low Mg is due to the diuretics. MRI showed ligamental injury. Per primary.  BP/ volume - BP mostly in goal with diuretics alone.  1.5kg under dry per weights yesterday.  Does not appear volume overloaded.  Euvolemic on exam [facility] - w/o fever.  Plain films of R foot were negative. WBC now trending down, then spike again yesterday.   DM2 - on high doses on SQ insulin at home. Per primary Anemia ckd - Hbgb 12.8.  Not on ESA.  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  Continue binder.  Nutrition - alb 4.2.  On carb modified diet.  Currently with hypokalemia, may need to increase K in bath on d/c.   Jen Mow, PA-C [facility] 08/07/2021,1:57 PM  LOS: 2 days

## 2021-08-07 NOTE — Progress Notes (Signed)
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  Presents presents with complaints of worsening pain on his right ankle.  He missed his hemodialysis secondary to this.  Was also hypoglycemic on arrival.  Currently plan is pain control and physical therapy evaluation.  Subjective: Pain controlled with medication.  No nausea no vomiting no fever no chills.  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  Assessment and Plan: 1.  Type 2 diabetes mellitus, uncontrolled with hyperand hypoglycemia with renal complication and long-term insulin use. Glucose fluctuate significantly. Patient's home regimen of U500 appears to be significantly high for him. Will adjust dose and monitor.  2.  Right ankle pain. Ligamental injury. MRI negative for any other acute abnormality. Discussed with orthopedics.  Recommend cam walker boot, weightbearing as tolerated and anti-inflammatory medication. Patient is not a good candidate for NSAIDs given allergy and prednisone given diabetes. Currently agreeable to consider colchicine as a potential option. Will monitor  3.  ESRD on HD Continue HD MWF.  Appreciate their assistance.  4.  HTN Blood pressure stable.  Monitor.  Continue current regimen.  HLD, continue statin. Neuropathy, continue gabapentin.  Dose increased. Muscle spasm, continue Flexeril. Mood disorder.  Continue home regimen. HFpEF. Management per HD as well as diuretics.  Dispo. PT OT recommends SNF. Will consult social worker.  Scheduled Meds:  amLODipine  5 mg Oral QHS   aspirin EC  81 mg Oral Daily   atorvastatin  40 mg Oral Daily   calcium acetate  1,334 mg Oral TID with meals   carvedilol  12.5 mg Oral BID WC   Chlorhexidine Gluconate Cloth  6 each Topical  Q0600   cyclobenzaprine  5 mg Oral TID   gabapentin  300 mg Oral BID   heparin  5,000 Units Subcutaneous Q8H   insulin aspart  0-15 Units Subcutaneous TID WC   insulin aspart  0-5 Units Subcutaneous QHS   insulin regular human CONCENTRATED  80 Units Subcutaneous 2 times per day on Sun Tue Thu Sat   And   [START ON 08/09/2021] insulin regular human CONCENTRATED  50 Units Subcutaneous 2 times per day on Mon Wed Fri   lactulose  30 g Oral BID   lidocaine  3 patch Transdermal Q24H   metolazone  10 mg Oral Daily   mupirocin ointment   Nasal BID   nortriptyline  20 mg Oral QHS   pantoprazole  20 mg Oral Daily   senna-docusate  1 tablet Oral BID   sertraline  100 mg Oral Daily   torsemide  100 mg Oral BID   Continuous Infusions: PRN Meds: acetaminophen **OR** acetaminophen, albuterol, diazepam, HYDROmorphone (DILAUDID) injection, HYDROmorphone  Body mass index is 45.16 kg/m.        DVT Prophylaxis:   heparin injection 5,000 Units Start: 08/04/21 0600    Advance goals of care discussion: Pt is Full code.  Family Communication: no family was present at bedside, at the time of interview.   Data Reviewed: I have personally reviewed and interpreted daily labs, tele strips, imaging. Blood glucose stabilizing. Physical Exam:  General: Appear in mild distress, no Rash; Oral Mucosa Clear, moist. no Abnormal Neck Mass Or lumps, Conjunctiva normal  Cardiovascular: S1 and S2 Present, no  Murmur, Respiratory: good respiratory effort, Bilateral Air entry present and CTA, no Crackles, no wheezes Abdomen: Bowel Sound present, Soft and no tenderness Extremities: no Pedal edema, left leg AKA Neurology: alert and oriented to time, place, and person affect appropriate. no new focal deficit Gait not checked due to patient safety concerns   Vitals:   08/06/21 1702 08/06/21 2042 08/07/21 0531 08/07/21 1746  BP: 136/82 (!) 160/81 (!) 153/89 128/86  Pulse:  98 81 97  Resp:  '19 17 18  '$ Temp: 99 F  (37.2 C) 99 F (37.2 C) 98.1 F (36.7 C) 98.2 F (36.8 C)  TempSrc: Oral Oral Oral Oral  SpO2: 97% 98% 90%   Weight:      Height:        Disposition:  Status is: Inpatient  Remains inpatient appropriate because:Ongoing active pain requiring inpatient pain management  Dispo: The patient is from: Home              Anticipated d/c is to: Home              Patient currently is not medically stable to d/c.   Difficult to place patient No        Time spent: 35 minutes. I reviewed all nursing notes, pharmacy notes, vitals, pertinent old records. I have discussed plan of care as described above with RN.  Author: Berle Mull, MD [facility] 08/07/2021 7:00 PM  To reach On-call, see care teams to locate the attending and reach out via [email or web address]. Between 7PM-7AM, please contact night-coverage If you still have difficulty reaching the attending provider, please page the [facility] (Director on Call) for [facility] on amion for assistance.

## 2021-08-08 DIAGNOSIS — E162 Hypoglycemia, unspecified: Secondary | ICD-10-CM | POA: Diagnosis not present

## 2021-08-08 LAB — GLUCOSE, CAPILLARY
Glucose-Capillary: 311 mg/dL — ABNORMAL HIGH (ref 70–99)
Glucose-Capillary: 304 mg/dL — ABNORMAL HIGH (ref 70–99)
Glucose-Capillary: 207 mg/dL — ABNORMAL HIGH (ref 70–99)
Glucose-Capillary: 170 mg/dL — ABNORMAL HIGH (ref 70–99)

## 2021-08-08 MED ORDER — CYCLOBENZAPRINE HCL 10 MG PO TABS
10.0000 mg | ORAL_TABLET | Freq: Three times a day (TID) | ORAL | Status: DC
  Administered 2021-08-08 – 2021-08-09 (×3): 10 mg via ORAL
  Filled 2021-08-08 (×3): qty 1

## 2021-08-08 MED ORDER — INSULIN REGULAR HUMAN (CONC) 500 UNIT/ML ~~LOC~~ SOPN
65.0000 [IU] | PEN_INJECTOR | SUBCUTANEOUS | Status: DC
  Administered 2021-08-09: 65 [IU] via SUBCUTANEOUS

## 2021-08-08 MED ORDER — OXYCODONE HCL ER 10 MG PO T12A: 10.0000 mg | EXTENDED_RELEASE_TABLET | Freq: Every day | ORAL | Status: DC

## 2021-08-08 MED ORDER — INSULIN REGULAR HUMAN (CONC) 500 UNIT/ML ~~LOC~~ SOPN
95.0000 [IU] | PEN_INJECTOR | SUBCUTANEOUS | Status: DC
  Administered 2021-08-08: 95 [IU] via SUBCUTANEOUS
  Filled 2021-08-08: qty 3

## 2021-08-08 MED ORDER — CHLORHEXIDINE GLUCONATE CLOTH 2 % EX PADS
6.0000 | MEDICATED_PAD | Freq: Every day | CUTANEOUS | Status: DC
  Administered 2021-08-09 – 2021-08-17 (×8): 6 via TOPICAL

## 2021-08-08 NOTE — Progress Notes (Signed)
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  Presents presents with complaints of worsening pain on his right ankle.  He missed his hemodialysis secondary to this.  Was also hypoglycemic on arrival. Currently plan is arrange for safe discharge at SNF and pain control.  Subjective: Pain control remains an issue.  No nausea no vomiting.  No fever no chills.  Assessment and Plan: 1.  Right ankle pain with ligamental injury Recent history of left AKA MRI negative for any acute fracture but shows attenuated talofibular ligament likely remote tear, mild lateral hindfoot impingement, edema showing sinus Tarsi syndrome, tibialis tenosynovitis and distal tendinopathy as well as flexor hallucis longus tenosynovitis and plantar fasciitis. Discussed with orthopedics. Cam walker boot recommended. Anti-inflammatory medications are recommended although the patient is allergic to NSAIDs and cannot use steroids. Received 1 dose of colchicine. Pain controlled with Dilaudid, Flexeril, gabapentin. PT recommends SNF  2.  Hyperglycemia in the setting of type 2 diabetes mellitus. Uncontrolled with hyperand hypoglycemia with renal complication. Long-term insulin use. Glucose not significantly low on admission.  Currently improving. Reducing home regimen of U5 100. Monitor.  3.  ESRD on HD MWF Appreciate nephrology assistance Continue HD for now.  HLD. Continue statin. Mood disorder. Continue home regimen. HTN. Continue home regimen. Morbid obesity.  OSA. Continue CPAP.  Watch for side effects of pain medications.  Scheduled Meds:  amLODipine  5 mg Oral QHS   aspirin EC  81 mg Oral Daily   atorvastatin  40 mg Oral Daily   calcium acetate  1,334 mg Oral  TID with meals   carvedilol  12.5 mg Oral BID WC   Chlorhexidine Gluconate Cloth  6 each Topical Q0600   Chlorhexidine Gluconate Cloth  6 each Topical Q0600   cyclobenzaprine  10 mg Oral TID   gabapentin  300 mg Oral BID   heparin  5,000 Units Subcutaneous Q8H   insulin aspart  0-15 Units Subcutaneous TID WC   insulin aspart  0-5 Units Subcutaneous QHS   insulin regular human CONCENTRATED  95 Units Subcutaneous 2 times per day on Sun Tue Thu Sat   And   [START ON 08/09/2021] insulin regular human CONCENTRATED  65 Units Subcutaneous 2 times per day on Mon Wed Fri   lidocaine  3 patch Transdermal Q24H   metolazone  10 mg Oral Daily   mupirocin ointment   Nasal BID   nortriptyline  20 mg Oral QHS   pantoprazole  20 mg Oral Daily   senna-docusate  1 tablet Oral BID   sertraline  100 mg Oral Daily   torsemide  100 mg Oral BID   Continuous Infusions: PRN Meds: acetaminophen **OR** acetaminophen, albuterol, diazepam, HYDROmorphone (DILAUDID) injection, HYDROmorphone  Body mass index is 45.16 kg/m.        DVT Prophylaxis:   heparin injection 5,000 Units Start: 08/04/21 0600    Advance goals of care discussion: Pt is Full code.  Family Communication: no family was present at bedside, at the time of interview.   Data Reviewed: I have personally reviewed and interpreted daily labs, tele strips, imaging. CBG now stable.  Physical Exam:  General: Appear in mild distress, no Rash; Oral Mucosa Clear, moist. no Abnormal  Neck Mass Or lumps, Conjunctiva normal  Cardiovascular: S1 and S2 Present, no Murmur, Respiratory: good respiratory effort, Bilateral Air entry present and CTA, no Crackles, no wheezes Abdomen: Bowel Sound present, Soft and no tenderness Extremities: no Pedal edema Neurology: alert and oriented to time, place, and person affect appropriate. no new focal deficit Gait not checked due to patient safety concerns    Vitals:   08/07/21 1746 08/07/21 2018 08/08/21 0617  08/08/21 1300  BP: 128/86 138/78 (!) 148/62 (!) 148/76  Pulse: 97 86 78 88  Resp: '18 15 19 18  '$ Temp: 98.2 F (36.8 C) 98.4 F (36.9 C) 98.5 F (36.9 C) 98.7 F (37.1 C)  TempSrc: Oral Oral Oral Oral  SpO2:  94% 99% 100%  Weight:      Height:        Disposition:  Status is: Inpatient  Remains inpatient appropriate because:Ongoing active pain requiring inpatient pain management  Dispo: The patient is from: Home              Anticipated d/c is to: SNF              Patient currently is not medically stable to d/c.   Difficult to place patient No        Time spent: 35 minutes. I reviewed all nursing notes, pharmacy notes, vitals, pertinent old records. I have discussed plan of care as described above with RN.  Author: Berle Mull, MD [facility] 08/08/2021 7:39 PM  To reach On-call, see care teams to locate the attending and reach out via [email or web address]. Between 7PM-7AM, please contact night-coverage If you still have difficulty reaching the attending provider, please page the [facility] (Director on Call) for [facility] on amion for assistance.

## 2021-08-08 NOTE — Progress Notes (Signed)
Patient has cpap at beside. Patient states he is able to take on and off when ready.

## 2021-08-08 NOTE — Progress Notes (Signed)
[facility] Progress Note   Subjective:   Patient seen and examined at bedside.  Slept pretty well last night.  States lidocaine patch is helping with pain in ankle.  Denies CP, SOB, abdominal pain and n/v/d.    Objective Vitals:   08/07/21 0531 08/07/21 1746 08/07/21 2018 08/08/21 0617  BP: (!) 153/89 128/86 138/78 (!) 148/62  Pulse: 81 97 86 78  Resp: '17 18 15 19  '$ Temp: 98.1 F (36.7 C) 98.2 F (36.8 C) 98.4 F (36.9 C) 98.5 F (36.9 C)  TempSrc: Oral Oral Oral Oral  SpO2: 90%  94% 99%  Weight:      Height:       Physical Exam General:well appearing male in NAD Heart:RRR, no mrg Lungs:CTAB anterolaterally, nml WOB on RA Abdomen:soft, NTND Extremities:L AKA, no edema on RLE Dialysis Access: [facility]   Filed Weights   08/03/21 2148 08/04/21 1637 08/06/21 1235  Weight: 125.6 kg 125.4 kg 123.1 kg    Intake/Output Summary (Last 24 hours) at 08/08/2021 0949 Last data filed at 08/08/2021 0800 Gross per 24 hour  Intake 1140 ml  Output 1800 ml  Net -660 ml    Additional Objective Labs: Basic Metabolic Panel: Recent Labs  Lab 08/04/21 0101 08/04/21 0122 08/04/21 0553 08/06/21 1714  NA 141 141  --  132*  K 3.1* 3.1*  --  3.3*  CL 94* 93*  --  91*  CO2 32  --   --  26  GLUCOSE 26* 26*  --  230*  BUN 130* >130*  --  57*  CREATININE 3.93* 4.00* 4.07* 2.56*  CALCIUM 8.7*  --   --  9.0  PHOS  --   --   --  3.3   Liver Function Tests: Recent Labs  Lab 08/06/21 1714  ALBUMIN 4.2    CBC: Recent Labs  Lab 08/04/21 0101 08/04/21 0122 08/04/21 0553 08/06/21 1500  WBC 16.4*  --  8.9 12.2*  NEUTROABS 12.4*  --   --   --   HGB 12.0* 13.3 10.5* 12.8*  HCT 38.5* 39.0 34.2* 39.5  MCV 87.3  --  89.1 85.9  PLT 234  --  143* 182   Blood Culture    Component Value Date/Time   SDES BLOOD LEFT ARM 08/04/2021 1830   SPECREQUEST  08/04/2021 1830    BOTTLES DRAWN AEROBIC AND ANAEROBIC Blood Culture results may not be optimal due to an inadequate volume of  blood received in culture bottles   CULT  08/04/2021 1830    NO GROWTH 3 DAYS Performed at [facility], [address] [place], [place] [address]    REPTSTATUS PENDING 08/04/2021 1830   CBG: Recent Labs  Lab 08/07/21 0741 08/07/21 1105 08/07/21 1745 08/07/21 2104 08/08/21 0730  GLUCAP 258* 382* 331* 270* 311*    Medications:   amLODipine  5 mg Oral QHS   aspirin EC  81 mg Oral Daily   atorvastatin  40 mg Oral Daily   calcium acetate  1,334 mg Oral TID with meals   carvedilol  12.5 mg Oral BID WC   Chlorhexidine Gluconate Cloth  6 each Topical Q0600   cyclobenzaprine  5 mg Oral TID   gabapentin  300 mg Oral BID   heparin  5,000 Units Subcutaneous Q8H   insulin aspart  0-15 Units Subcutaneous TID WC   insulin aspart  0-5 Units Subcutaneous QHS   insulin regular human CONCENTRATED  95 Units Subcutaneous 2 times per day  on Sun Tue Thu Sat   And   [START ON 08/09/2021] insulin regular human CONCENTRATED  65 Units Subcutaneous 2 times per day on Mon Wed Fri   lactulose  30 g Oral BID   lidocaine  3 patch Transdermal Q24H   metolazone  10 mg Oral Daily   mupirocin ointment   Nasal BID   nortriptyline  20 mg Oral QHS   pantoprazole  20 mg Oral Daily   senna-docusate  1 tablet Oral BID   sertraline  100 mg Oral Daily   torsemide  100 mg Oral BID    Dialysis Orders: [facility]-M[facility] 3h 25mn  450/ 1.5  124.5kg  2/2.5 bath  [facility] TDC/ (RUA AVG - not in use) - venofer '100mg'$  x 1 , rec'd only 1 dose on 07/30/21   Assessment/Plan: Hypoglycemia - per pmd, is on high dose insulin at home.  ESRD [place] HD [place]WF.  [place].   Cramping/Left leg pain - pt has multiple c/o's about pain in his R leg (hx L AKA), shooting pains up into his chest into his L chest HD catheter. Ca++ level is wnl. Mg 1.9 yesterday.He is taking large doses of loop diuretics and also metolazone, and is voiding large amounts daily.  Diuretics could be  contributing to cramping.  Suspect the low Mg is due to the diuretics. MRI showed ligamental injury. Lidocaine patch helping. Per primary.  BP/ volume - BP mostly in goal with diuretics alone.  1.5kg under dry per weights yesterday.  Does not appear volume overloaded.  Euvolemic on exam, lower dry weight on d/c. Leukocytosis - afebrile.  Plain films of R foot were negative. WBC up and down. DM2 - on high doses on SQ insulin at home. Per primary Anemia ckd - Hgb 12.8.  Not on ESA.  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  Continue binder.  Nutrition - alb 4.2.  On carb modified diet.  Currently with hypokalemia, may need to increase K in bath on d/c if continues.    LJen Mow PA-C [facility] 08/08/2021,9:49 AM  LOS: 3 days

## 2021-08-08 NOTE — NC FL2 (Signed)
[place] MEDICAID FL2 LEVEL OF CARE SCREENING TOOL     IDENTIFICATION  Patient Name: Jeffrey [name] Birthdate: [birth year] Sex: male Admission Date (Current Location): 08/03/2021  [facility] and [place] Number:  Herbalist and Address:  The . [facility], [place] [address] [place], [place] [address]      Provider Number: [id number]  Attending Physician Name and Address:  Lavina Hamman, MD  Relative Name and Phone Number:       Current Level of Care: Hospital Recommended Level of Care: [place] Prior Approval Number:    Date Approved/Denied:   PASRR Number: [id number]  Discharge Plan: SNF    Current Diagnoses: Patient Active Problem List   Diagnosis Date Noted   Hypoglycemia 08/04/2021   Sepsis ([place])    Severe anemia    Infection of prosthetic left knee joint ([place])    MRSA bacteremia 03/30/2021   Left knee pain    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    Fever 03/29/2021   ESRD (end stage renal disease) ([place]) 03/29/2021   COPD (chronic obstructive pulmonary disease) ([place]) 03/29/2021   Hypertension complicating diabetes ([place]) 03/29/2021   Type 2 diabetes mellitus with hyperlipidemia ([place]) 03/29/2021   PTSD (post-traumatic stress disorder) 03/29/2021   CHF (congestive heart failure) ([place]) 03/29/2021    Orientation RESPIRATION BLADDER Height & Weight     Self, Time, Situation, Place  Normal Continent Weight: 271 lb 6.2 oz (123.1 kg) Height:  '5\' 5"'$  (165.1 cm)  BEHAVIORAL SYMPTOMS/MOOD NEUROLOGICAL BOWEL NUTRITION STATUS      Continent Diet (See DC summary)  AMBULATORY STATUS COMMUNICATION OF NEEDS Skin   Extensive Assist Verbally Normal                       Personal Care Assistance Level of Assistance  Bathing, Feeding, Dressing Bathing Assistance: Limited assistance Feeding assistance: Limited assistance Dressing Assistance: Limited assistance     Functional Limitations Info  Sight, Hearing,  Speech Sight Info: Adequate Hearing Info: Adequate Speech Info: Adequate    SPECIAL CARE FACTORS FREQUENCY  PT (By licensed PT), OT (By licensed OT)     PT Frequency: 5x a week OT Frequency: 5x a week            Contractures Contractures Info: Not present    Additional Factors Info  Code Status, Allergies, Psychotropic, Insulin Sliding Scale Code Status Info: Full Allergies Info: Bupropion   Dexmedetomidine   Ibuprofen   Pioglitazone   Tomato Psychotropic Info: sertraline (ZOLOFT) tablet 100 mg Insulin Sliding Scale Info: See DC summary       Current Medications (08/08/2021):  This is the current hospital active medication list Current Facility-Administered Medications  Medication Dose Route Frequency Provider Last Rate Last Admin   acetaminophen (TYLENOL) tablet 650 mg  650 mg Oral Q6H PRN Rise Patience, MD       Or   acetaminophen (TYLENOL) suppository 650 mg  650 mg Rectal Q6H PRN Rise Patience, MD       albuterol (VENTOLIN HFA) 108 (90 Base) MCG/ACT inhaler 1-2 puff  1-2 puff Inhalation Q6H PRN Rise Patience, MD       amLODipine (NORVASC) tablet 5 mg  5 mg Oral QHS Rise Patience, MD   5 mg at 08/07/21 2315   aspirin EC tablet 81 mg  81 mg Oral Daily Rise Patience, MD   81 mg at 08/08/21 0841   atorvastatin (LIPITOR)  tablet 40 mg  40 mg Oral Daily Rise Patience, MD   40 mg at 08/08/21 0841   calcium acetate (PHOSLO) capsule 1,334 mg  1,334 mg Oral TID with meals Rise Patience, MD   1,334 mg at 08/08/21 1144   carvedilol (COREG) tablet 12.5 mg  12.5 mg Oral BID WC Rise Patience, MD   12.5 mg at 08/08/21 0842   Chlorhexidine Gluconate Cloth 2 % PADS 6 each  6 each Topical Q0600 Adelfa Koh, NP   6 each at 08/08/21 0848   Chlorhexidine Gluconate Cloth 2 % PADS 6 each  6 each Topical Q0600 Penninger, Ria Comment, PA       cyclobenzaprine (FLEXERIL) tablet 10 mg  10 mg Oral TID Lavina Hamman, MD       diazepam  (VALIUM) tablet 5 mg  5 mg Oral BID PRN Rise Patience, MD   5 mg at 08/05/21 1106   gabapentin (NEURONTIN) capsule 300 mg  300 mg Oral BID Lavina Hamman, MD   300 mg at 08/08/21 0841   heparin injection 5,000 Units  5,000 Units Subcutaneous Q8H Rise Patience, MD   5,000 Units at 08/08/21 0645   HYDROmorphone (DILAUDID) injection 0.5 mg  0.5 mg Intravenous Q4H PRN Lavina Hamman, MD   0.5 mg at 08/08/21 1148   HYDROmorphone (DILAUDID) tablet 4 mg  4 mg Oral Q6H PRN Lavina Hamman, MD   4 mg at 08/06/21 1230   insulin aspart (novoLOG) injection 0-15 Units  0-15 Units Subcutaneous TID WC Lavina Hamman, MD   11 Units at 08/08/21 1141   insulin aspart (novoLOG) injection 0-5 Units  0-5 Units Subcutaneous QHS Lavina Hamman, MD   3 Units at 08/07/21 2322   insulin regular human CONCENTRATED (HUMULIN R) 500 UNIT/ML KwikPen 95 Units  95 Units Subcutaneous 2 times per day on Sun Tue Thu Sat Lavina Hamman, MD       And   [START ON 08/09/2021] insulin regular human CONCENTRATED (HUMULIN R) 500 UNIT/ML KwikPen 65 Units  65 Units Subcutaneous 2 times per day on Mon Wed Fri Lavina Hamman, MD       lidocaine (LIDODERM) 5 % 3 patch  3 patch Transdermal Q24H Rise Patience, MD   3 patch at 08/07/21 2323   metolazone (ZAROXOLYN) tablet 10 mg  10 mg Oral Daily Rise Patience, MD   10 mg at 08/08/21 0841   mupirocin ointment (BACTROBAN) 2 %   Nasal BID Lavina Hamman, MD   Given at 08/08/21 0844   nortriptyline (PAMELOR) capsule 20 mg  20 mg Oral QHS Rise Patience, MD   20 mg at 08/07/21 2315   pantoprazole (PROTONIX) EC tablet 20 mg  20 mg Oral Daily Rise Patience, MD   20 mg at 08/08/21 0842   senna-docusate (Senokot-S) tablet 1 tablet  1 tablet Oral BID Lavina Hamman, MD   1 tablet at 08/08/21 0842   sertraline (ZOLOFT) tablet 100 mg  100 mg Oral Daily Rise Patience, MD   100 mg at 08/08/21 0841   torsemide (DEMADEX) tablet 100 mg  100 mg Oral BID  Rise Patience, MD   100 mg at 08/08/21 [id number]     Discharge Medications: Please see discharge summary for a list of discharge medications.  Relevant Imaging Results:  Relevant Lab Results:   Additional Information SSN: [id number].Covid vax with 2 boosters. Requires OP  Dialysis transport to [facility] MWF 6:30am  Emeterio Reeve, [place]

## 2021-08-08 NOTE — TOC Initial Note (Signed)
Transition of Care [facility]) - Initial/Assessment Note    Patient Details  Name: Cheston Coury MRN: [id number] Date of Birth: [birth year]  Transition of Care [facility]) CM/SW Contact:    Emeterio Reeve, LCSW Phone Number: 08/08/2021, 1:26 PM  Clinical Narrative:                  CSW received SNF consult. CSW met with pt at bedside. CSW introduced self and explained role at the hospital. Pt reports that PTA lived at home. Pt reports his daughter lives there sometimes. Pt reports he uses a wheelchair and is independent with transfers and ADL's. Pt reports he is waiting for his left leg prosthetic to be fitted so he can walk again  CSW reviewed PT/OT recommendations for SNF. Pt reports he is fine with going to SNF. Pt gave CSW permission to fax out to facilities in the area. Pt has no preference of facility at this time. Pt lives I [place] but would prefer a facility in [place]. CSW gave pt medicare.gov rating list to review. CSW explained insurance auth process. Pt reports they are covid vaccinated with both boosters.  CSW will continue to follow.   Expected Discharge Plan: Skilled Nursing Facility Barriers to Discharge: Continued Medical Work up, Ship broker   Patient Goals and CMS Choice Patient states their goals for this hospitalization and ongoing recovery are:: To get back walking CMS Medicare.gov Compare Post Acute Care list provided to:: Patient Choice offered to / list presented to : Patient  Expected Discharge Plan and Services Expected Discharge Plan: [place]       Living arrangements for the past 2 months: Single Family Home                                      Prior Living Arrangements/Services Living arrangements for the past 2 months: Single Family Home Lives with:: Self, Adult Children Patient language and need for interpreter reviewed:: Yes Do you feel safe going back to the place where you live?: Yes      Need for Family  Participation in Patient Care: Yes (Comment) Care giver support system in place?: Yes (comment)   Criminal Activity/Legal Involvement Pertinent to Current Situation/Hospitalization: No - Comment as needed  Activities of Daily Living Home Assistive Devices/Equipment: Blood pressure cuff, CBG Meter, CPAP, Eyeglasses, Bedside commode/3-in-1, Scales, Wheelchair ADL Screening (condition at time of admission) Patient's cognitive ability adequate to safely complete daily activities?: Yes Is the patient deaf or have difficulty hearing?: No Does the patient have difficulty seeing, even when wearing glasses/contacts?: No Does the patient have difficulty concentrating, remembering, or making decisions?: No Patient able to express need for assistance with ADLs?: Yes Does the patient have difficulty dressing or bathing?: No Independently performs ADLs?: Yes (appropriate for developmental age) Does the patient have difficulty walking or climbing stairs?: Yes Weakness of Legs: Left Weakness of Arms/Hands: None  Permission Sought/Granted Permission sought to share information with : Chartered certified accountant granted to share information with : Yes, Verbal Permission Granted     Permission granted to share info w AGENCY: SNF        Emotional Assessment Appearance:: Appears stated age Attitude/Demeanor/Rapport: Engaged Affect (typically observed): Appropriate Orientation: : Oriented to Self, Oriented to Place, Oriented to  Time, Oriented to Situation Alcohol / Substance Use: Not Applicable Psych Involvement: No (comment)  Admission diagnosis:  Hypoglycemia [E16.2] Muscle spasm [  M62.838] Acute right ankle pain [M25.571] Patient Active Problem List   Diagnosis Date Noted   Hypoglycemia 08/04/2021   Sepsis ([place])    Severe anemia    Infection of prosthetic left knee joint ([place])    MRSA bacteremia 03/30/2021   Left knee pain    Presence of primary arteriovenous graft for  hemodialysis    Fever 03/29/2021   ESRD (end stage renal disease) ([place]) 03/29/2021   COPD (chronic obstructive pulmonary disease) ([place]) 03/29/2021   Hypertension complicating diabetes ([place]) 03/29/2021   Type 2 diabetes mellitus with hyperlipidemia ([place]) 03/29/2021   PTSD (post-traumatic stress disorder) 03/29/2021   CHF (congestive heart failure) ([place]) 03/29/2021   PCP:  Merryl Hacker, No Pharmacy:   [facility] [place], [place] AT [facility] [address] [place] [address] Phone: [phone] Fax: [phone]     Social Determinants of Health (SDOH) Interventions    Readmission Risk Interventions Readmission Risk Prevention Plan 04/07/2021  Transportation Screening Complete  Medication Review Press photographer) Referral to Pharmacy  PCP or Specialist appointment within 3-5 days of discharge Complete  HRI or [place] Complete  SW Recovery Care/Counseling Consult Complete  [place] Complete    Emeterio Reeve, [place] Social Worker

## 2021-08-09 ENCOUNTER — Inpatient Hospital Stay (HOSPITAL_COMMUNITY): Payer: Medicare Other

## 2021-08-09 DIAGNOSIS — E1122 Type 2 diabetes mellitus with diabetic chronic kidney disease: Secondary | ICD-10-CM

## 2021-08-09 DIAGNOSIS — E162 Hypoglycemia, unspecified: Secondary | ICD-10-CM | POA: Diagnosis not present

## 2021-08-09 DIAGNOSIS — I120 Hypertensive chronic kidney disease with stage 5 chronic kidney disease or end stage renal disease: Secondary | ICD-10-CM

## 2021-08-09 DIAGNOSIS — N186 End stage renal disease: Secondary | ICD-10-CM

## 2021-08-09 DIAGNOSIS — T82898A Other specified complication of vascular prosthetic devices, implants and grafts, initial encounter: Secondary | ICD-10-CM

## 2021-08-09 DIAGNOSIS — Z8616 Personal history of COVID-19: Secondary | ICD-10-CM

## 2021-08-09 DIAGNOSIS — Z87891 Personal history of nicotine dependence: Secondary | ICD-10-CM

## 2021-08-09 LAB — RENAL FUNCTION PANEL
Albumin: 3.5 g/dL (ref 3.5–5.0)
Anion gap: 15 (ref 5–15)
BUN: 126 mg/dL — ABNORMAL HIGH (ref 8–23)
CO2: 25 mmol/L (ref 22–32)
Calcium: 8.8 mg/dL — ABNORMAL LOW (ref 8.9–10.3)
Chloride: 89 mmol/L — ABNORMAL LOW (ref 98–111)
Creatinine, Ser: 5.01 mg/dL — ABNORMAL HIGH (ref 0.61–1.24)
GFR, Estimated: 12 mL/min — ABNORMAL LOW (ref >60)
Glucose, Bld: 313 mg/dL — ABNORMAL HIGH (ref 70–99)
Phosphorus: 6.4 mg/dL — ABNORMAL HIGH (ref 2.5–4.6)
Potassium: 3.7 mmol/L (ref 3.5–5.1)
Sodium: 129 mmol/L — ABNORMAL LOW (ref 135–145)

## 2021-08-09 LAB — CBC
HCT: 32.6 % — ABNORMAL LOW (ref 39.0–52.0)
Hemoglobin: 10.5 g/dL — ABNORMAL LOW (ref 13.0–17.0)
MCH: 27 pg (ref 26.0–34.0)
MCHC: 32.2 g/dL (ref 30.0–36.0)
MCV: 83.8 fL (ref 80.0–100.0)
Platelets: 134 10*3/uL — ABNORMAL LOW (ref 150–400)
RBC: 3.89 MIL/uL — ABNORMAL LOW (ref 4.22–5.81)
RDW: 15 % (ref 11.5–15.5)
WBC: 9 10*3/uL (ref 4.0–10.5)
nRBC: 0 % (ref 0.0–0.2)

## 2021-08-09 LAB — GLUCOSE, CAPILLARY
Glucose-Capillary: 276 mg/dL — ABNORMAL HIGH (ref 70–99)
Glucose-Capillary: 286 mg/dL — ABNORMAL HIGH (ref 70–99)
Glucose-Capillary: 287 mg/dL — ABNORMAL HIGH (ref 70–99)
Glucose-Capillary: 366 mg/dL — ABNORMAL HIGH (ref 70–99)
Glucose-Capillary: 401 mg/dL — ABNORMAL HIGH (ref 70–99)

## 2021-08-09 LAB — CULTURE, BLOOD (ROUTINE X 2)
Culture: NO GROWTH
Culture: NO GROWTH

## 2021-08-09 MED ORDER — SODIUM CHLORIDE 0.9 % IV SOLN
100.0000 mL | INTRAVENOUS | Status: DC | PRN
Start: 2021-08-09 — End: 2021-08-09

## 2021-08-09 MED ORDER — METHOCARBAMOL 1000 MG/10ML IJ SOLN
500.0000 mg | Freq: Three times a day (TID) | INTRAVENOUS | Status: DC
  Administered 2021-08-09 – 2021-08-10 (×4): 500 mg via INTRAVENOUS
  Filled 2021-08-09 (×6): qty 5

## 2021-08-09 MED ORDER — HYDROMORPHONE HCL 1 MG/ML IJ SOLN
0.5000 mg | Freq: Once | INTRAMUSCULAR | Status: AC
  Administered 2021-08-09: 0.5 mg via INTRAVENOUS
  Filled 2021-08-09: qty 1

## 2021-08-09 MED ORDER — SODIUM CHLORIDE 0.9 % IV SOLN: 100.0000 mL | INTRAVENOUS | Status: DC | PRN

## 2021-08-09 MED ORDER — HEPARIN SODIUM (PORCINE) 1000 UNIT/ML DIALYSIS: 1000.0000 [IU] | INTRAMUSCULAR | Status: DC | PRN

## 2021-08-09 MED ORDER — INSULIN REGULAR HUMAN (CONC) 500 UNIT/ML ~~LOC~~ SOPN
95.0000 [IU] | PEN_INJECTOR | SUBCUTANEOUS | Status: DC
  Administered 2021-08-10: 95 [IU] via SUBCUTANEOUS
  Filled 2021-08-09: qty 3

## 2021-08-09 MED ORDER — PENTAFLUOROPROP-TETRAFLUOROETH EX AERO: 1.0000 "application " | INHALATION_SPRAY | CUTANEOUS | Status: DC | PRN

## 2021-08-09 MED ORDER — LIDOCAINE HCL (PF) 1 % IJ SOLN: 5.0000 mL | INTRAMUSCULAR | Status: DC | PRN

## 2021-08-09 MED ORDER — LIDOCAINE-PRILOCAINE 2.5-2.5 % EX CREA: 1.0000 "application " | TOPICAL_CREAM | CUTANEOUS | Status: DC | PRN

## 2021-08-09 MED ORDER — DEXAMETHASONE SODIUM PHOSPHATE 4 MG/ML IJ SOLN
4.0000 mg | Freq: Four times a day (QID) | INTRAMUSCULAR | Status: DC
  Administered 2021-08-10 – 2021-08-11 (×7): 4 mg via INTRAVENOUS
  Filled 2021-08-09 (×10): qty 1

## 2021-08-09 MED ORDER — ALTEPLASE 2 MG IJ SOLR: 2.0000 mg | Freq: Once | INTRAMUSCULAR | Status: DC | PRN

## 2021-08-09 MED ORDER — PREGABALIN 25 MG PO CAPS
25.0000 mg | ORAL_CAPSULE | Freq: Two times a day (BID) | ORAL | Status: DC
  Administered 2021-08-09 – 2021-08-17 (×16): 25 mg via ORAL
  Filled 2021-08-09 (×16): qty 1

## 2021-08-09 MED ORDER — PREGABALIN 25 MG PO CAPS: 25.0000 mg | ORAL_CAPSULE | Freq: Two times a day (BID) | ORAL | Status: DC

## 2021-08-09 MED ORDER — DEXAMETHASONE SODIUM PHOSPHATE 4 MG/ML IJ SOLN
4.0000 mg | Freq: Four times a day (QID) | INTRAMUSCULAR | Status: DC
  Filled 2021-08-09 (×2): qty 1

## 2021-08-09 MED ORDER — OXYCODONE HCL ER 10 MG PO T12A: 10.0000 mg | EXTENDED_RELEASE_TABLET | Freq: Every day | ORAL | Status: DC

## 2021-08-09 MED ORDER — INSULIN REGULAR HUMAN (CONC) 500 UNIT/ML ~~LOC~~ SOPN
70.0000 [IU] | PEN_INJECTOR | SUBCUTANEOUS | Status: DC
  Administered 2021-08-09: 70 [IU] via SUBCUTANEOUS

## 2021-08-09 MED ORDER — LIDOCAINE HCL 1 % IJ SOLN
5.0000 mL | Freq: Once | INTRAMUSCULAR | Status: DC
  Filled 2021-08-09: qty 5

## 2021-08-09 MED ORDER — HEPARIN SODIUM (PORCINE) 1000 UNIT/ML IJ SOLN
INTRAMUSCULAR | Status: AC
  Filled 2021-08-09: qty 4

## 2021-08-09 MED ORDER — HYDROMORPHONE HCL 1 MG/ML IJ SOLN
1.0000 mg | Freq: Once | INTRAMUSCULAR | Status: AC
  Administered 2021-08-09: 1 mg via INTRAVENOUS
  Filled 2021-08-09: qty 1

## 2021-08-09 MED ORDER — METHYLPREDNISOLONE ACETATE 40 MG/ML IJ SUSP
80.0000 mg | Freq: Once | INTRAMUSCULAR | Status: DC
  Filled 2021-08-09: qty 2

## 2021-08-09 NOTE — Consult Note (Signed)
Reason for Consult:Right ankle pain Referring Physician: Berle Mull Time called: [id number] Time at bedside: Jeffrey [name] is an 60 y.o. male.  HPI: [name] was in his usual state of health the middle of last week when he woke up with significant right ankle pain. He went to go to the bathroom and had severe pain that shot up his leg and into his groin. Since then he has had constant waxing and waning RLE pain that is worst in the ankle. He denies any prior hx/o similar or hx/o gout. He does not remember any antecedent event. A MRI of the ankle last week showed some significant subtalar arthritis but a CAM boot did not significantly help.  Past Medical History:  Diagnosis Date   Arthritis    Blood transfusion without reported diagnosis    CHF (congestive heart failure) ([facility])    COPD (chronic obstructive pulmonary disease) ([place])    Diabetes mellitus without complication ([place])    Hypertension    PTSD (post-traumatic stress disorder)    Renal disorder    Stage 5 Kidney Failure    Past Surgical History:  Procedure Laterality Date   AMPUTATION Left 04/03/2021   Procedure: LEFT ABOVE-THE-KNEE AMPUTATION;  Surgeon: Erle Crocker, MD;  Location: [facility];  Service: Orthopedics;  Laterality: Left;   IR REMOVAL TUN CV CATH W/O FL  03/30/2021   JOINT REPLACEMENT     Bilateral knees    History reviewed. No pertinent family history.  Social History:  reports that he quit smoking about 10 years ago. His smoking use included cigarettes. He has never used smokeless tobacco. He reports current alcohol use. He reports that he does not currently use drugs.  Allergies:  Allergies  Allergen Reactions   Bupropion Swelling   Dexmedetomidine Nausea And Vomiting    Other reaction(s): Other (See Comments) Dose-limiting bradycardia Dose-limiting bradycardia    Ibuprofen Shortness Of Breath and Swelling    Pt tolerates aspirin Pt tolerates aspirin    Pioglitazone Anaphylaxis and Rash   Tomato  Anaphylaxis    Only allergic to RAW tomatoes    Medications: I have reviewed the patient's current medications.  Results for orders placed or performed during the hospital encounter of 08/03/21 (from the past 48 hour(s))  Glucose, capillary     Status: Abnormal   Collection Time: 08/07/21  5:45 PM  Result Value Ref Range   Glucose-Capillary 331 (H) 70 - 99 mg/dL    Comment: Glucose reference range applies only to samples taken after fasting for at least 8 hours.  Glucose, capillary     Status: Abnormal   Collection Time: 08/07/21  9:04 PM  Result Value Ref Range   Glucose-Capillary 270 (H) 70 - 99 mg/dL    Comment: Glucose reference range applies only to samples taken after fasting for at least 8 hours.  Glucose, capillary     Status: Abnormal   Collection Time: 08/08/21  7:30 AM  Result Value Ref Range   Glucose-Capillary 311 (H) 70 - 99 mg/dL    Comment: Glucose reference range applies only to samples taken after fasting for at least 8 hours.  Glucose, capillary     Status: Abnormal   Collection Time: 08/08/21 11:21 AM  Result Value Ref Range   Glucose-Capillary 304 (H) 70 - 99 mg/dL    Comment: Glucose reference range applies only to samples taken after fasting for at least 8 hours.  Glucose, capillary     Status: Abnormal   Collection  Time: 08/08/21  3:37 PM  Result Value Ref Range   Glucose-Capillary 207 (H) 70 - 99 mg/dL    Comment: Glucose reference range applies only to samples taken after fasting for at least 8 hours.  Glucose, capillary     Status: Abnormal   Collection Time: 08/08/21  8:07 PM  Result Value Ref Range   Glucose-Capillary 170 (H) 70 - 99 mg/dL    Comment: Glucose reference range applies only to samples taken after fasting for at least 8 hours.  Glucose, capillary     Status: Abnormal   Collection Time: 08/09/21 12:26 AM  Result Value Ref Range   Glucose-Capillary 287 (H) 70 - 99 mg/dL    Comment: Glucose reference range applies only to samples taken  after fasting for at least 8 hours.   Comment 1 Notify RN   Renal function panel     Status: Abnormal   Collection Time: 08/09/21  6:48 AM  Result Value Ref Range   Sodium 129 (L) 135 - 145 mmol/L   Potassium 3.7 3.5 - 5.1 mmol/L   Chloride 89 (L) 98 - 111 mmol/L   CO2 25 22 - 32 mmol/L   Glucose, Bld 313 (H) 70 - 99 mg/dL    Comment: Glucose reference range applies only to samples taken after fasting for at least 8 hours.   BUN 126 (H) 8 - 23 mg/dL   Creatinine, Ser 5.01 (H) 0.61 - 1.24 mg/dL   Calcium 8.8 (L) 8.9 - 10.3 mg/dL   Phosphorus 6.4 (H) 2.5 - 4.6 mg/dL   Albumin 3.5 3.5 - 5.0 g/dL   GFR, Estimated 12 (L) >60 mL/min    Comment: (NOTE) Calculated using the CKD-EPI Creatinine Equation (2021)    Anion gap 15 5 - 15    Comment: Performed at [facility] [address], [place], [place] [address]  CBC     Status: Abnormal   Collection Time: 08/09/21  6:48 AM  Result Value Ref Range   WBC 9.0 4.0 - 10.5 K/uL   RBC 3.89 (L) 4.22 - 5.81 MIL/uL   Hemoglobin 10.5 (L) 13.0 - 17.0 g/dL   HCT 32.6 (L) 39.0 - 52.0 %   MCV 83.8 80.0 - 100.0 fL   MCH 27.0 26.0 - 34.0 pg   MCHC 32.2 30.0 - 36.0 g/dL   RDW 15.0 11.5 - 15.5 %   Platelets 134 (L) 150 - 400 K/uL   nRBC 0.0 0.0 - 0.2 %    Comment: Performed at [facility], [place] [address], [place], [place] [address]  Glucose, capillary     Status: Abnormal   Collection Time: 08/09/21  7:49 AM  Result Value Ref Range   Glucose-Capillary 286 (H) 70 - 99 mg/dL    Comment: Glucose reference range applies only to samples taken after fasting for at least 8 hours.  Glucose, capillary     Status: Abnormal   Collection Time: 08/09/21 11:21 AM  Result Value Ref Range   Glucose-Capillary 401 (H) 70 - 99 mg/dL    Comment: Glucose reference range applies only to samples taken after fasting for at least 8 hours.    No results found.  Review of Systems  HENT:  Negative for ear discharge, ear pain, hearing loss and tinnitus.    Eyes:  Negative for photophobia and pain.  Respiratory:  Negative for cough and shortness of breath.   Cardiovascular:  Negative for chest pain.  Gastrointestinal:  Negative for abdominal  pain, nausea and vomiting.  Genitourinary:  Negative for dysuria, flank pain, frequency and urgency.  Musculoskeletal:  Positive for arthralgias (Right ankle) and myalgias (Entire right leg). Negative for back pain and neck pain.  Neurological:  Negative for dizziness and headaches.  Hematological:  Does not bruise/bleed easily.  Psychiatric/Behavioral:  The patient is not nervous/anxious.   Blood pressure (!) 147/79, pulse 92, temperature 98.7 F (37.1 C), temperature source Oral, resp. rate 18, height '5\' 5"'$  (1.651 m), weight 123.1 kg, SpO2 93 %. Physical Exam Constitutional:      General: He is not in acute distress.    Appearance: He is well-developed. He is not diaphoretic.  HENT:     Head: Normocephalic and atraumatic.  Eyes:     General: No scleral icterus.       Right eye: No discharge.        Left eye: No discharge.     Conjunctiva/sclera: Conjunctivae normal.  Cardiovascular:     Rate and Rhythm: Normal rate and regular rhythm.  Pulmonary:     Effort: Pulmonary effort is normal. No respiratory distress.  Musculoskeletal:     Cervical back: Normal range of motion.     Comments: RLE No traumatic wounds, ecchymosis, or rash  Severe TTP to light touch thigh/lower leg/foot. Exquisite pain with ankle PROM.  No knee or ankle effusion  Knee stable to varus/ valgus and anterior/posterior stress  Sens DPN, SPN, TN intact  Motor EHL, ext, flex, evers grossly intact  DP 2+, PT 1+, No significant edema  Skin:    General: Skin is warm and dry.  Neurological:     Mental Status: He is alert.  Psychiatric:        Mood and Affect: Mood normal.        Behavior: Behavior normal.    Assessment/Plan: RLE pain -- This seems more neuropathic that anything to me. Have recommended MRI l-spine. Gout is  possible though this would be an unusual initial presentation. Even if the subtalar arthritis is a component of his pain it would not explain the rest of his symptoms so there much be another etiology. Will plan on steroid injection of the subtalar joint tomorrow to see if that will give any relief.    Lisette Abu, PA-C Orthopedic Surgery [phone] 08/09/2021, 12:26 PM

## 2021-08-09 NOTE — H&P (View-Only) (Signed)
Hospital Consult    Reason for Consult:  malfunctioning RUA AVG placed at [facility]. Requesting Physician:  Ouida Sills MRN #:  [id number]  History of Present Illness: This is a 60 y.o. male who presented to the hospital with several days ago with diffuse muscle aches in the right foot and leg that moved to his chest.  His blood sugar was in the 30's.  He was covid negative.  He was admitted for treatment of his hypoglycemia.   He states that he is right handed.  He originally had a left BC AVF but this had to be ligated for steal bc he was having sensory and motor issues in the left hand.  He then underwent a right axillary loop graft (both procedures at [facility]).  He states that when they first stuck the graft, his arm "blew up 3x the normal size".  He states that it is very difficult to stick as it is posterior.  When asked about a fistulogram, he tells me he has had two of these.  He says the first one was normal and the 2nd one they had to balloon a blockage but he is unsure where the blockage was.   He states that the fingers in the right hand still feel like "sausages" from the swelling he had.  The swelling is better.    He tells me that in 03-15-19, he had covid and "died 3x on the table" and was brought back.  He denies any hx of heart attack, stroke or blood clots.  He does not have any family hx of AAA.  He does have hx of LLE amputation.  He states that he had a left knee replacement at [facility].  He states that about a year later, he had gotten to where he could not walk on his leg.  He went back and he says they told him nothing was wrong.  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   He is on HD M/W/F at the [facility] location.  He was originally on HD in [place] but now living with his daughter here in [place].    [facility] is asked to evaluate his graft.    The pt is on a statin for cholesterol management.  The pt is on a daily aspirin.    Other AC:  sq heparin The pt is on BB, CCB  for hypertension.   The pt is diabetic.   Tobacco hx:  former  Past Medical History:  Diagnosis Date   Arthritis    Blood transfusion without reported diagnosis    CHF (congestive heart failure) (HCC)    COPD (chronic obstructive pulmonary disease) ([place])    Diabetes mellitus without complication ([place])    Hypertension    PTSD (post-traumatic stress disorder)    Renal disorder    Stage 5 Kidney Failure    Past Surgical History:  Procedure Laterality Date   AMPUTATION Left 04/03/2021   Procedure: LEFT ABOVE-THE-KNEE AMPUTATION;  Surgeon: Erle [facility], MD;  Location: [place];  Service: Orthopedics;  Laterality: Left;   IR REMOVAL TUN CV CATH W/O FL  03/30/2021   JOINT REPLACEMENT     Bilateral knees    Allergies  Allergen Reactions   Bupropion Swelling   Dexmedetomidine Nausea And Vomiting    Other reaction(s): Other (See Comments) Dose-limiting bradycardia Dose-limiting bradycardia    Ibuprofen Shortness Of Breath and Swelling    Pt tolerates  aspirin Pt tolerates aspirin    Pioglitazone Anaphylaxis and Rash   Tomato Anaphylaxis    Only allergic to RAW tomatoes    Prior to Admission medications   Medication Sig Start Date End Date Taking? Authorizing Provider  acetaminophen (TYLENOL) 325 MG tablet Take 2 tablets (650 mg total) by mouth every 6 (six) hours as needed for moderate pain. 04/14/21  Yes Sheikh, Omair Latif, DO  albuterol (PROVENTIL) (2.5 MG/3ML) 0.083% nebulizer solution Inhale 2.5 mg into the lungs every 8 (eight) hours as needed for wheezing or shortness of breath.   Yes [provider]  albuterol (VENTOLIN HFA) 108 (90 Base) MCG/ACT inhaler Inhale 1-2 puffs into the lungs every 6 (six) hours as needed for wheezing or shortness of breath.   Yes [provider]  amLODipine (NORVASC) 5 MG tablet Take 5 mg by mouth at bedtime. 04/17/20  Yes [provider]  ASPIRIN LOW DOSE 81 MG EC tablet  Take 81 mg by mouth daily. 02/22/21  Yes [provider]  atorvastatin (LIPITOR) 40 MG tablet Take 40 mg by mouth daily. 02/09/21  Yes [provider]  calcium acetate (PHOSLO) 667 MG capsule Take 2 capsules (1,334 mg total) by mouth with breakfast, with lunch, and with evening meal. 04/10/21  Yes Debbe Odea, MD  carvedilol (COREG) 12.5 MG tablet Take 12.5 mg by mouth 2 (two) times daily with a meal. 04/17/20  Yes [provider]  Cholecalciferol 50 MCG (2000 UT) CAPS Take 2,000 Units by mouth daily. 08/24/20  Yes [provider]  Continuous Blood Gluc Receiver (DEXCOM G6 RECEIVER) DEVI Use 1 Device continuously 03/08/21  Yes [provider]  Continuous Blood Gluc Sensor (DEXCOM G6 SENSOR) MISC Use 1 each every 10 (ten) days 03/08/21  Yes [provider]  Continuous Blood Gluc Transmit (DEXCOM G6 TRANSMITTER) MISC Use 1 each every 3 (three) months 03/08/21  Yes [provider]  Cysteamine Bitartrate (PROCYSBI) 300 MG PACK accucheck guide meter 03/09/20  Yes [provider]  diazepam (VALIUM) 5 MG tablet Take 5 mg by mouth 2 (two) times daily as needed for anxiety. 07/12/21  Yes [provider]  diphenhydrAMINE (BENADRYL) 25 MG tablet Take 50 mg by mouth every 6 (six) hours as needed for allergies.   Yes [provider]  Emollient (OINTMENT BASE) OINT Apply 1 application topically daily as needed (dry skin). 06/23/21 06/23/22 Yes [provider]  gabapentin (NEURONTIN) 300 MG capsule Take 300 mg by mouth at bedtime. 02/22/21  Yes [provider]  glucagon 1 MG injection Inject 1 mg into the muscle as needed (low blood sugar). 06/23/21  Yes [provider]  HYDROmorphone (DILAUDID) 2 MG tablet Take 2 mg by mouth every 6 (six) hours as needed for moderate pain. 07/14/21  Yes [provider]  insulin regular human CONCENTRATED (HUMULIN R U-500 KWIKPEN) 500 UNIT/ML kwikpen Inject 65-120 Units  into the skin See admin instructions. 140 units before breakfast and  125 units before dinner on non-dialysis days 70 units bid on dialysis days Monday,Wednesday and friday 09/28/20  Yes [provider]  metolazone (ZAROXOLYN) 10 MG tablet Take 10 mg by mouth daily. 07/13/21  Yes [provider]  nortriptyline (PAMELOR) 10 MG capsule Take 20 mg by mouth at bedtime. 10/23/19 02/22/22 Yes [provider]  Nutritional Supplements (,FEEDING SUPPLEMENT, PROSOURCE PLUS) liquid Take 30 mLs by mouth 2 (two) times daily between meals. 04/11/21  Yes Debbe Odea, MD  oxyCODONE (OXY IR/ROXICODONE) 5 MG  immediate release tablet Take 1-2 tablets (5-10 mg total) by mouth every 4 (four) hours as needed for moderate pain. 04/14/21  Yes Sheikh, Omair Latif, DO  pantoprazole (PROTONIX) 20 MG tablet Take 20 mg by mouth daily. 02/09/21  Yes [provider]  polyethylene glycol powder (GLYCOLAX/MIRALAX) 17 GM/SCOOP powder Take 17 g by mouth daily as needed for mild constipation. 08/24/20  Yes [provider]  senna-docusate (SENOKOT-S) 8.6-50 MG tablet Take 2 tablets by mouth 2 (two) times daily as needed for mild constipation. 10/23/19  Yes [provider]  sertraline (ZOLOFT) 100 MG tablet Take 100 mg by mouth daily. 02/22/21  Yes [provider]  torsemide (DEMADEX) 20 MG tablet Take 100 mg by mouth 2 (two) times daily. 06/04/21 06/04/22 Yes [provider]  ACCU-CHEK GUIDE test strip 4 (four) times daily. 11/15/20   [provider]  glucose blood (PRECISION QID TEST) test strip 4 times daily accucheck guide meter 09/28/20   [provider]  vancomycin (VANCOCIN) 1-5 GM/200ML-% SOLN Inject 200 mLs (1,000 mg total) into the vein every Monday, Wednesday, and Friday with hemodialysis. Patient not taking: No sig reported 04/12/21   Debbe Odea, MD    Social History   Socioeconomic History   Marital status: Married    Spouse name: Not on  file   Number of children: Not on file   Years of education: Not on file   Highest education level: Not on file  Occupational History   Not on file  Tobacco Use   Smoking status: Former    Types: Cigarettes    Quit date: 2012    Years since quitting: 10.7   Smokeless tobacco: Never  Vaping Use   Vaping Use: Never used  Substance and Sexual Activity   Alcohol use: Yes   Drug use: Not Currently    Comment: Years ago   Sexual activity: Not on file  Other Topics Concern   Not on file  Social History Narrative   Not on file   Social Determinants of Health   Financial Resource Strain: Not on file  Food Insecurity: Not on file  Transportation Needs: Not on file  Physical Activity: Not on file  Stress: Not on file  Social Connections: Not on file  Intimate Partner Violence: Not on file    Family Hx:  no family hx of AAA  ROS: '[x]'$  Positive   '[ ]'$  Negative   '[ ]'$  All sytems reviewed and are negative  Cardiac: '[]'$  chest pain/pressure '[]'$  palpitations '[]'$  SOB lying flat '[]'$  DOE  Vascular: '[x]'$  pain in legs while walking '[x]'$  pain in legs at rest '[]'$  pain in legs at night '[]'$  hx of DVT '[]'$  swelling in legs  Pulmonary: '[x]'$  hx covid  Neurologic: '[]'$  hx of CVA '[]'$  mini stroke   Hematologic: '[]'$  hx of cancer  Endocrine:   '[x]'$  diabetes '[]'$  thyroid disease  GI '[]'$  GERD  GU: '[]'$  CKD/renal failure '[x]'$  HD--'[x]'$  M/W/F or '[]'$  T/T/S  Psychiatric: '[]'$  anxiety '[]'$  depression  Musculoskeletal: '[]'$  arthritis '[]'$  joint pain  Integumentary: '[]'$  rashes '[]'$  ulcers  Constitutional: '[]'$  fever  '[]'$  chills  Physical Examination  Vitals:   08/08/21 2009 08/09/21 0811  BP: 135/76 (!) 147/79  Pulse:    Resp:  18  Temp:  98.7 F (37.1 C)  SpO2:     Body mass index is 45.16 kg/m.  General:  WDWN in NAD Gait: Not observed HENT: WNL, normocephalic Pulmonary: normal non-labored breathing Cardiac: regular, without  Murmur; without carotid bruits Abdomen: obese soft, NT/ND; aortic pulse  is not palpable Skin: without rashes Vascular Exam/Pulses:  Right Left  Radial 2+ (normal) 2+ (normal)  AT 2+ (normal) AKA  PT Unable to palpate AKA   Extremities: without ischemic changes, without Gangrene , without cellulitis; without open wounds; sensation in both hands decreased.  Hand grips 4/5 bilaterally; no thrill is felt or bruit heard in the RUA AVG Musculoskeletal: no muscle wasting or atrophy  Neurologic: A&O X 3; speech is fluent/normal Psychiatric:  The pt has Normal affect.   CBC    Component Value Date/Time   WBC 9.0 08/09/2021 0648   RBC 3.89 (L) 08/09/2021 0648   HGB 10.5 (L) 08/09/2021 0648   HCT 32.6 (L) 08/09/2021 0648   PLT 134 (L) 08/09/2021 0648   MCV 83.8 08/09/2021 0648   MCH 27.0 08/09/2021 0648   MCHC 32.2 08/09/2021 0648   RDW 15.0 08/09/2021 0648   LYMPHSABS 2.6 08/04/2021 0101   MONOABS 1.2 (H) 08/04/2021 0101   EOSABS 0.1 08/04/2021 0101   BASOSABS 0.1 08/04/2021 0101    BMET    Component Value Date/Time   NA 129 (L) 08/09/2021 0648   K 3.7 08/09/2021 0648   CL 89 (L) 08/09/2021 0648   CO2 25 08/09/2021 0648   GLUCOSE 313 (H) 08/09/2021 0648   BUN 126 (H) 08/09/2021 0648   CREATININE 5.01 (H) 08/09/2021 0648   CALCIUM 8.8 (L) 08/09/2021 0648   GFRNONAA 12 (L) 08/09/2021 0648    COAGS: Lab Results  Component Value Date   INR 1.2 03/28/2021     Non-Invasive Vascular Imaging:   none   ASSESSMENT/PLAN: This is a 60 y.o. 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  He subsequently had a RUA axillary loop AVG placed earlier this year.  A portion of the graft is posterior and he states was difficult to access.  He states he did have some arm swelling when the graft was accessed for HD.  He has had a couple of fistulograms with angioplasty of a blockage.  Unfortunately, I do not appreciate a thrill or bruit in the graft.  Given his issues and we do not know how long  the graft has been clotted, he would most likely need a venogram.  He possibly has some steal in the right hand as well as he does have decreased sensation in the hand since the graft was placed. He would be high risk for infection with thigh AVG.  Dr. Donzetta Matters is on call and will see the pt later this afternoon.     Leontine Locket, PA-C [facility] [phone]  I have independently interviewed and examined patient and agree with PA assessment and plan above. Plan for right upper extremity venogram tomorrow in the pv lab with Dr. Trula Slade to plan further access.   Saber Dickerman C. Donzetta Matters, MD [facility] [place] Office: [phone] Pager: [phone]

## 2021-08-09 NOTE — Consult Note (Addendum)
Hospital Consult    Reason for Consult:  malfunctioning RUA AVG placed at [facility]. Requesting Physician:  Ouida Sills MRN #:  [id number]  History of Present Illness: This is a 60 y.o. male who presented to the hospital with several days ago with diffuse muscle aches in the right foot and leg that moved to his chest.  His blood sugar was in the 30's.  He was covid negative.  He was admitted for treatment of his hypoglycemia.   He states that he is right handed.  He originally had a left BC AVF but this had to be ligated for steal bc he was having sensory and motor issues in the left hand.  He then underwent a right axillary loop graft (both procedures at [facility]).  He states that when they first stuck the graft, his arm "blew up 3x the normal size".  He states that it is very difficult to stick as it is posterior.  When asked about a fistulogram, he tells me he has had two of these.  He says the first one was normal and the 2nd one they had to balloon a blockage but he is unsure where the blockage was.   He states that the fingers in the right hand still feel like "sausages" from the swelling he had.  The swelling is better.    He tells me that in 03-05-19, he had covid and "died 3x on the table" and was brought back.  He denies any hx of heart attack, stroke or blood clots.  He does not have any family hx of AAA.  He does have hx of LLE amputation.  He states that he had a left knee replacement at [facility].  He states that about a year later, he had gotten to where he could not walk on his leg.  He went back and he says they told him nothing was wrong.  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   He is on HD M/W/F at the [place] location.  He was originally on HD in [place] but now living with his daughter here in [place].    VVS is asked to evaluate his graft.    The pt is on a statin for cholesterol management.  The pt is on a daily aspirin.    Other AC:  sq heparin The pt is on BB, CCB  for hypertension.   The pt is diabetic.   Tobacco hx:  former  Past Medical History:  Diagnosis Date   Arthritis    Blood transfusion without reported diagnosis    CHF (congestive heart failure) (HCC)    COPD (chronic obstructive pulmonary disease) ([place])    Diabetes mellitus without complication ([place])    Hypertension    PTSD (post-traumatic stress disorder)    Renal disorder    Stage 5 Kidney Failure    Past Surgical History:  Procedure Laterality Date   AMPUTATION Left 04/03/2021   Procedure: LEFT ABOVE-THE-KNEE AMPUTATION;  Surgeon: Erle Crocker, MD;  Location: [place];  Service: Orthopedics;  Laterality: Left;   IR REMOVAL TUN CV CATH W/O FL  03/30/2021   JOINT REPLACEMENT     Bilateral knees    Allergies  Allergen Reactions   Bupropion Swelling   Dexmedetomidine Nausea And Vomiting    Other reaction(s): Other (See Comments) Dose-limiting bradycardia Dose-limiting bradycardia    Ibuprofen Shortness Of Breath and Swelling    Pt tolerates  aspirin Pt tolerates aspirin    Pioglitazone Anaphylaxis and Rash   Tomato Anaphylaxis    Only allergic to RAW tomatoes    Prior to Admission medications   Medication Sig Start Date End Date Taking? Authorizing Provider  acetaminophen (TYLENOL) 325 MG tablet Take 2 tablets (650 mg total) by mouth every 6 (six) hours as needed for moderate pain. 04/14/21  Yes Sheikh, Omair Latif, DO  albuterol (PROVENTIL) (2.5 MG/3ML) 0.083% nebulizer solution Inhale 2.5 mg into the lungs every 8 (eight) hours as needed for wheezing or shortness of breath.   Yes [provider]  albuterol (VENTOLIN HFA) 108 (90 Base) MCG/ACT inhaler Inhale 1-2 puffs into the lungs every 6 (six) hours as needed for wheezing or shortness of breath.   Yes [provider]  amLODipine (NORVASC) 5 MG tablet Take 5 mg by mouth at bedtime. 04/17/20  Yes [provider]  ASPIRIN LOW DOSE 81 MG EC tablet  Take 81 mg by mouth daily. 02/22/21  Yes [provider]  atorvastatin (LIPITOR) 40 MG tablet Take 40 mg by mouth daily. 02/09/21  Yes [provider]  calcium acetate (PHOSLO) 667 MG capsule Take 2 capsules (1,334 mg total) by mouth with breakfast, with lunch, and with evening meal. 04/10/21  Yes Debbe Odea, MD  carvedilol (COREG) 12.5 MG tablet Take 12.5 mg by mouth 2 (two) times daily with a meal. 04/17/20  Yes [provider]  Cholecalciferol 50 MCG (2000 UT) CAPS Take 2,000 Units by mouth daily. 08/24/20  Yes [provider]  Continuous Blood Gluc Receiver (DEXCOM G6 RECEIVER) DEVI Use 1 Device continuously 03/08/21  Yes [provider]  Continuous Blood Gluc Sensor (DEXCOM G6 SENSOR) MISC Use 1 each every 10 (ten) days 03/08/21  Yes [provider]  Continuous Blood Gluc Transmit (DEXCOM G6 TRANSMITTER) MISC Use 1 each every 3 (three) months 03/08/21  Yes [provider]  Cysteamine Bitartrate (PROCYSBI) 300 MG PACK accucheck guide meter 03/09/20  Yes [provider]  diazepam (VALIUM) 5 MG tablet Take 5 mg by mouth 2 (two) times daily as needed for anxiety. 07/12/21  Yes [provider]  diphenhydrAMINE (BENADRYL) 25 MG tablet Take 50 mg by mouth every 6 (six) hours as needed for allergies.   Yes [provider]  Emollient (OINTMENT BASE) OINT Apply 1 application topically daily as needed (dry skin). 06/23/21 06/23/22 Yes [provider]  gabapentin (NEURONTIN) 300 MG capsule Take 300 mg by mouth at bedtime. 02/22/21  Yes [provider]  glucagon 1 MG injection Inject 1 mg into the muscle as needed (low blood sugar). 06/23/21  Yes [provider]  HYDROmorphone (DILAUDID) 2 MG tablet Take 2 mg by mouth every 6 (six) hours as needed for moderate pain. 07/14/21  Yes [provider]  insulin regular human CONCENTRATED (HUMULIN R U-500 KWIKPEN) 500 UNIT/ML kwikpen Inject 65-120 Units  into the skin See admin instructions. 140 units before breakfast and  125 units before dinner on non-dialysis days 70 units bid on dialysis days Monday,Wednesday and friday 09/28/20  Yes [provider]  metolazone (ZAROXOLYN) 10 MG tablet Take 10 mg by mouth daily. 07/13/21  Yes [provider]  nortriptyline (PAMELOR) 10 MG capsule Take 20 mg by mouth at bedtime. 10/23/19 02/22/22 Yes [provider]  Nutritional Supplements (,FEEDING SUPPLEMENT, PROSOURCE PLUS) liquid Take 30 mLs by mouth 2 (two) times daily between meals. 04/11/21  Yes Debbe Odea, MD  oxyCODONE (OXY IR/ROXICODONE) 5 MG  immediate release tablet Take 1-2 tablets (5-10 mg total) by mouth every 4 (four) hours as needed for moderate pain. 04/14/21  Yes Sheikh, Omair Latif, DO  pantoprazole (PROTONIX) 20 MG tablet Take 20 mg by mouth daily. 02/09/21  Yes [provider]  polyethylene glycol powder (GLYCOLAX/MIRALAX) 17 GM/SCOOP powder Take 17 g by mouth daily as needed for mild constipation. 08/24/20  Yes [provider]  senna-docusate (SENOKOT-S) 8.6-50 MG tablet Take 2 tablets by mouth 2 (two) times daily as needed for mild constipation. 10/23/19  Yes [provider]  sertraline (ZOLOFT) 100 MG tablet Take 100 mg by mouth daily. 02/22/21  Yes [provider]  torsemide (DEMADEX) 20 MG tablet Take 100 mg by mouth 2 (two) times daily. 06/04/21 06/04/22 Yes [provider]  ACCU-CHEK GUIDE test strip 4 (four) times daily. 11/15/20   [provider]  glucose blood (PRECISION QID TEST) test strip 4 times daily accucheck guide meter 09/28/20   [provider]  vancomycin (VANCOCIN) 1-5 GM/200ML-% SOLN Inject 200 mLs (1,000 mg total) into the vein every Monday, Wednesday, and Friday with hemodialysis. Patient not taking: No sig reported 04/12/21   Debbe Odea, MD    Social History   Socioeconomic History   Marital status: Married    Spouse name: Not on  file   Number of children: Not on file   Years of education: Not on file   Highest education level: Not on file  Occupational History   Not on file  Tobacco Use   Smoking status: Former    Types: Cigarettes    Quit date: 2012    Years since quitting: 10.7   Smokeless tobacco: Never  Vaping Use   Vaping Use: Never used  Substance and Sexual Activity   Alcohol use: Yes   Drug use: Not Currently    Comment: Years ago   Sexual activity: Not on file  Other Topics Concern   Not on file  Social History Narrative   Not on file   Social Determinants of Health   Financial Resource Strain: Not on file  Food Insecurity: Not on file  Transportation Needs: Not on file  Physical Activity: Not on file  Stress: Not on file  Social Connections: Not on file  Intimate Partner Violence: Not on file    Family Hx:  no family hx of AAA  ROS: '[x]'$  Positive   '[ ]'$  Negative   '[ ]'$  All sytems reviewed and are negative  Cardiac: '[]'$  chest pain/pressure '[]'$  palpitations '[]'$  SOB lying flat '[]'$  DOE  Vascular: '[x]'$  pain in legs while walking '[x]'$  pain in legs at rest '[]'$  pain in legs at night '[]'$  hx of DVT '[]'$  swelling in legs  Pulmonary: '[x]'$  hx covid  Neurologic: '[]'$  hx of CVA '[]'$  mini stroke   Hematologic: '[]'$  hx of cancer  Endocrine:   '[x]'$  diabetes '[]'$  thyroid disease  GI '[]'$  GERD  GU: '[]'$  CKD/renal failure '[x]'$  HD--'[x]'$  M/W/F or '[]'$  T/T/S  Psychiatric: '[]'$  anxiety '[]'$  depression  Musculoskeletal: '[]'$  arthritis '[]'$  joint pain  Integumentary: '[]'$  rashes '[]'$  ulcers  Constitutional: '[]'$  fever  '[]'$  chills  Physical Examination  Vitals:   08/08/21 2009 08/09/21 0811  BP: 135/76 (!) 147/79  Pulse:    Resp:  18  Temp:  98.7 F (37.1 C)  SpO2:     Body mass index is 45.16 kg/m.  General:  WDWN in NAD Gait: Not observed HENT: WNL, normocephalic Pulmonary: normal non-labored breathing Cardiac: regular, without  Murmur; without carotid bruits Abdomen: obese soft, NT/ND; aortic pulse  is not palpable Skin: without rashes Vascular Exam/Pulses:  Right Left  Radial 2+ (normal) 2+ (normal)  AT 2+ (normal) AKA  PT Unable to palpate AKA   Extremities: without ischemic changes, without Gangrene , without cellulitis; without open wounds; sensation in both hands decreased.  Hand grips 4/5 bilaterally; no thrill is felt or bruit heard in the RUA AVG Musculoskeletal: no muscle wasting or atrophy  Neurologic: A&O X 3; speech is fluent/normal Psychiatric:  The pt has Normal affect.   CBC    Component Value Date/Time   WBC 9.0 08/09/2021 0648   RBC 3.89 (L) 08/09/2021 0648   HGB 10.5 (L) 08/09/2021 0648   HCT 32.6 (L) 08/09/2021 0648   PLT 134 (L) 08/09/2021 0648   MCV 83.8 08/09/2021 0648   MCH 27.0 08/09/2021 0648   MCHC 32.2 08/09/2021 0648   RDW 15.0 08/09/2021 0648   LYMPHSABS 2.6 08/04/2021 0101   MONOABS 1.2 (H) 08/04/2021 0101   EOSABS 0.1 08/04/2021 0101   BASOSABS 0.1 08/04/2021 0101    BMET    Component Value Date/Time   NA 129 (L) 08/09/2021 0648   K 3.7 08/09/2021 0648   CL 89 (L) 08/09/2021 0648   CO2 25 08/09/2021 0648   GLUCOSE 313 (H) 08/09/2021 0648   BUN 126 (H) 08/09/2021 0648   CREATININE 5.01 (H) 08/09/2021 0648   CALCIUM 8.8 (L) 08/09/2021 0648   GFRNONAA 12 (L) 08/09/2021 0648    COAGS: Lab Results  Component Value Date   INR 1.2 03/28/2021     Non-Invasive Vascular Imaging:   none   ASSESSMENT/PLAN: This is a 60 y.o. 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  He subsequently had a RUA axillary loop AVG placed earlier this year.  A portion of the graft is posterior and he states was difficult to access.  He states he did have some arm swelling when the graft was accessed for HD.  He has had a couple of fistulograms with angioplasty of a blockage.  Unfortunately, I do not appreciate a thrill or bruit in the graft.  Given his issues and we do not know how long  the graft has been clotted, he would most likely need a venogram.  He possibly has some steal in the right hand as well as he does have decreased sensation in the hand since the graft was placed. He would be high risk for infection with thigh AVG.  Dr. Donzetta Matters is on call and will see the pt later this afternoon.     Leontine Locket, PA-C [facility] [phone]  I have independently interviewed and examined patient and agree with PA assessment and plan above. Plan for right upper extremity venogram tomorrow in the pv lab with Dr. Trula Slade to plan further access.   Cristyn Crossno C. Donzetta Matters, MD [facility] [place] Office: [phone] Pager: [phone]

## 2021-08-09 NOTE — Progress Notes (Addendum)
[facility] Progress Note   Subjective:    Seen and examined patient at bedside. No acute issues overnight. Denies SOB and CP. Plan for HD today.  Objective Vitals:   08/08/21 1300 08/08/21 2004 08/08/21 2009 08/09/21 0811  BP: (!) 148/76 135/76 135/76 (!) 147/79  Pulse: 88 92    Resp: '18 18  18  '$ Temp: 98.7 F (37.1 C) 99 F (37.2 C)  98.7 F (37.1 C)  TempSrc: Oral Oral  Oral  SpO2: 100% 93%    Weight:      Height:       Physical Exam General: Well-appearing male; NAD Heart: S1 and S2; No murmurs, gallops, or rubs Lungs: CTA; breathing unlabored Abdomen: Large, soft, and non-tender Extremities: L AKA; No edema RLE Dialysis Access:  TDC in use/R AVG malfunction (-) Bruit/Thrill  Filed Weights   08/03/21 2148 08/04/21 1637 08/06/21 1235  Weight: 125.6 kg 125.4 kg 123.1 kg    Intake/Output Summary (Last 24 hours) at 08/09/2021 0918 Last data filed at 08/09/2021 0028 Gross per 24 hour  Intake 480 ml  Output 1600 ml  Net -1120 ml    Additional Objective Labs: Basic Metabolic Panel: Recent Labs  Lab 08/04/21 0101 08/04/21 0122 08/04/21 0553 08/06/21 1714 08/09/21 0648  NA 141 141  --  132* 129*  K 3.1* 3.1*  --  3.3* 3.7  CL 94* 93*  --  91* 89*  CO2 32  --   --  26 25  GLUCOSE 26* 26*  --  230* 313*  BUN 130* >130*  --  57* 126*  CREATININE 3.93* 4.00* 4.07* 2.56* 5.01*  CALCIUM 8.7*  --   --  9.0 8.8*  PHOS  --   --   --  3.3 6.4*   Liver Function Tests: Recent Labs  Lab 08/06/21 1714 08/09/21 0648  ALBUMIN 4.2 3.5   No results for input(s): LIPASE, AMYLASE in the last 168 hours. CBC: Recent Labs  Lab 08/04/21 0101 08/04/21 0122 08/04/21 0553 08/06/21 1500 08/09/21 0648  WBC 16.4*  --  8.9 12.2* 9.0  NEUTROABS 12.4*  --   --   --   --   HGB 12.0*   < > 10.5* 12.8* 10.5*  HCT 38.5*   < > 34.2* 39.5 32.6*  MCV 87.3  --  89.1 85.9 83.8  PLT 234  --  143* 182 134*   < > = values in this interval not displayed.   Blood  Culture    Component Value Date/Time   SDES BLOOD LEFT ARM 08/04/2021 1830   SPECREQUEST  08/04/2021 1830    BOTTLES DRAWN AEROBIC AND ANAEROBIC Blood Culture results may not be optimal due to an inadequate volume of blood received in culture bottles   CULT  08/04/2021 1830    NO GROWTH 4 DAYS Performed at [facility], [place] [address] [place], [place] [address]    REPTSTATUS PENDING 08/04/2021 1830    Cardiac Enzymes: No results for input(s): CKTOTAL, CKMB, CKMBINDEX, TROPONINI in the last 168 hours. CBG: Recent Labs  Lab 08/08/21 1121 08/08/21 1537 08/08/21 2007 08/09/21 0026 08/09/21 0749  GLUCAP 304* 207* 170* 287* 286*   Iron Studies: No results for input(s): IRON, TIBC, TRANSFERRIN, FERRITIN in the last 72 hours. Lab Results  Component Value Date   INR 1.2 03/28/2021   Studies/Results: No results found.  Medications:  sodium chloride     sodium chloride      amLODipine  5  mg Oral QHS   aspirin EC  81 mg Oral Daily   atorvastatin  40 mg Oral Daily   calcium acetate  1,334 mg Oral TID with meals   carvedilol  12.5 mg Oral BID WC   Chlorhexidine Gluconate Cloth  6 each Topical Q0600   cyclobenzaprine  10 mg Oral TID   gabapentin  300 mg Oral BID   heparin  5,000 Units Subcutaneous Q8H   insulin aspart  0-15 Units Subcutaneous TID WC   insulin aspart  0-5 Units Subcutaneous QHS   insulin regular human CONCENTRATED  95 Units Subcutaneous 2 times per day on Sun Tue Thu Sat   And   insulin regular human CONCENTRATED  65 Units Subcutaneous 2 times per day on Mon Wed Fri   lidocaine  3 patch Transdermal Q24H   metolazone  10 mg Oral Daily   mupirocin ointment   Nasal BID   nortriptyline  20 mg Oral QHS   pantoprazole  20 mg Oral Daily   senna-docusate  1 tablet Oral BID   sertraline  100 mg Oral Daily   torsemide  100 mg Oral BID    Dialysis Orders: [facility]-MWF 3h 72mn  450/ 1.5  124.5kg  2/2.5 bath  [facility] TDC/ (RUA AVG - not in use) - venofer '100mg'$  x 1 ,  rec'd only 1 dose on 07/30/21  Assessment/Plan: Hypoglycemia - per pmd, is on high dose insulin at home.  Right Ankle Pain-MRI (-) acute fracture but shows attenuated ligament likely remote tear. Continue current pain management. PT recommends SNF. Managed by primary. ESRD - [place] HD MWF.  [place].   Cramping/Left leg pain - pt has multiple c/o's about pain in his R leg (hx L AKA), shooting pains up into his chest into his L chest HD catheter. Ca++ level is wnl. Mg 1.9 yesterday.He is taking large doses of loop diuretics and also metolazone, and is voiding large amounts daily.  Diuretics could be contributing to cramping. Patient noted to be hypokalemic-recent K+ now 3.7-may need to d/c Metolazone. Suspect the low Mg is due to the diuretics. MRI showed ligamental injury. Lidocaine patch helping. Per primary.  BP/ volume - BP mostly in goal with diuretics alone.  1.5kg under dry per weights yesterday.  Does not appear volume overloaded.  Euvolemic on exam, lower dry weight on d/c. Leukocytosis - afebrile.  Plain films of R foot were negative. WBC up and down. DM2 - on high doses on SQ insulin at home. Per primary Anemia ckd - Hgb now 10.5.  Not on ESA-monitor trends. 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  Continue binder.  Nutrition - alb 4.2.  On carb modified diet.  Currently with hypokalemia, may need to increase K in bath on d/c if continues. R AVF malfunction- Patient currently concerned of R AVG malfunction and would like VVS to assess. Reviewed previous records, R AVF placed on 02/01/21 via Dr. SEzzard Flax([facility]). Consulted VVS today for evaluation while patient is here in hospital.  CTobie Poet NP [facility] 08/09/2021,9:18 AM  LOS: 4 days    Seen and examined independently.  Agree with note and exam as documented above by physician extender and as noted here.  He's frustrated about graft not working and worried about having  to use a catheter.  States AVF only worked once/few tx's and often infiltrated. Hard to stick.  [facility]  General adult male in bed in no acute distress HEENT normocephalic atraumatic extraocular movements intact sclera anicteric Neck supple trachea midline Lungs clear to auscultation bilaterally normal work of breathing at rest  Heart S1S2 no rub Abdomen soft nontender  sl. distended Extremities no pitting RLE edema; left AKA Psych normal mood and affect Neuro alert and oriented; conversant Access - R AVF no bruit or thrill and left tunn catheter in place  ESRD on MWF schedule  R AVF thrombosed - VVS consulted today; anticipate outpatient procedure  HTN controlled   Anemia CKD - defer ESA  Claudia Desanctis, MD 08/09/2021  1:06 PM

## 2021-08-09 NOTE — TOC Progression Note (Addendum)
Transition of Care [facility]) - Progression Note    Patient Details  Name: Jeffrey [name] MRN: [id number] Date of Birth: 1961-02-15  Transition of Care [facility]) CM/SW [place] Phone Number: 08/09/2021, 11:50 AM  Clinical Narrative:     Update- CSW spoke with patient and patient declined bed offers CSW provided patient . CSW reached out to pending SNF bed offers [facility] and [facility] to see if they could offer SNF bed for patient. CSW awaiting callback from pending SNF bed offers. CSW reached out to [facility] renal navigator to confirm patients HD information. CSW awaiting callback.   CSW spoke with patient at bedside. CSW provided patient with SNF bed offers. CSW gave patient medicare.gov ratings list to review. Patient wants to discuss bed offer with spouse. CSW will follow up with patient this afternoon with SNF choice. CSW will continue to follow and assist with dc planning needs.  Expected Discharge Plan: [place] Barriers to Discharge: Continued Medical Work up, Ship broker  Expected Discharge Plan and Services Expected Discharge Plan: [place]       Living arrangements for the past 2 months: [place] Determinants of Health (SDOH) Interventions    Readmission Risk Interventions Readmission Risk Prevention Plan 04/07/2021  Transportation Screening Complete  Medication Review Press photographer) Referral to Pharmacy  PCP or Specialist appointment within 3-5 days of discharge Complete  HRI or Home Care Consult Complete  SW Recovery Care/Counseling Consult Complete  Palliative Care Screening Not [place] Complete

## 2021-08-09 NOTE — Progress Notes (Signed)
After return from dialysis. Spoke with Dr. Posey Pronto and he advised to check pt blood sugar and cover with sliding scale as normal due to elevated blood sugars through shift. Also advised to administer scheduled Humulin

## 2021-08-09 NOTE — Progress Notes (Signed)
Patient has cpap at beside. Patient states he is able to take on and off when ready.

## 2021-08-09 NOTE — Progress Notes (Addendum)
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  Presents presents with complaints of worsening pain on his right ankle.  He missed his hemodialysis secondary to this.  Was also hypoglycemic on arrival. Currently plan is arrange for safe discharge at SNF and pain control.  Subjective: Continues to report severe pain.  Overnight pain requirement actually increased.  No nausea no vomiting.  No fever no chills.  Touching elicits severe pain.  Assessment and Plan: 1.  Right ankle pain with ligamental injury Recent history of left AKA MRI negative for any acute fracture but shows attenuated talofibular ligament likely remote tear, mild lateral hindfoot impingement, edema showing sinus Tarsi syndrome, tibialis tenosynovitis and distal tendinopathy as well as flexor hallucis longus tenosynovitis and plantar fasciitis. Discussed with orthopedics. Cam walker boot recommended. Anti-inflammatory medications are recommended although the patient is allergic to NSAIDs and cannot use steroids. Received 1 dose of colchicine. Pain controlled with Dilaudid, Flexeril, gabapentin. PT recommends SNF  2.  Hypo and hyperglycemia in the setting of type 2 diabetes mellitus. Uncontrolled with hyperand hypoglycemia with renal complication. Long-term insulin use. Glucose significantly low on admission.  Currently improving. Continue home insulin regimen.  Monitor.  3.  ESRD on HD MWF Appreciate nephrology assistance Continue HD for now.  4.  Concern for right leg radiculopathy. Compression of the cauda equina nerve roots MRI L-spine performed.  Shows evidence of multilevel degenerative disc disorder with impingement on L4 nerve roots as well as compression of the cauda  equina nerve roots. Will discuss with neurosurgery regarding further assistance. There is concern for marrow signal abnormality at L5-S1, but discitis or osteomyelitis not suspected clinically. Discussed with neurosurgery. Recommend to start the patient on Decadron 4 mg every 6 hours. Will transition patient to progressive care unit given most likely he will require IV insulin therapy.  HLD. Continue statin. Mood disorder. Continue home regimen. HTN. Continue home regimen. Morbid obesity.  OSA. Continue CPAP.  Watch for side effects of pain medications.  Scheduled Meds:  amLODipine  5 mg Oral QHS   aspirin EC  81 mg Oral Daily   atorvastatin  40 mg Oral Daily   calcium acetate  1,334 mg Oral TID with meals   carvedilol  12.5 mg Oral BID WC   Chlorhexidine Gluconate Cloth  6 each Topical Q0600   heparin  5,000 Units Subcutaneous Q8H   heparin sodium (porcine)       insulin aspart  0-15 Units Subcutaneous TID WC   insulin aspart  0-5 Units Subcutaneous QHS   insulin regular human CONCENTRATED  70 Units Subcutaneous 2 times per day on Mon Wed Fri   And   [START ON 08/10/2021] insulin regular human CONCENTRATED  95 Units Subcutaneous 2 times per day on Sun Tue Thu Sat   lidocaine  3 patch Transdermal Q24H   lidocaine  5 mL Other Once   methylPREDNISolone acetate  80 mg Intra-articular Once   metolazone  10 mg Oral Daily   mupirocin ointment   Nasal BID   nortriptyline  20 mg Oral QHS   pantoprazole  20 mg Oral Daily   pregabalin  25 mg Oral BID   senna-docusate  1 tablet Oral BID  sertraline  100 mg Oral Daily   torsemide  100 mg Oral BID   Continuous Infusions:  methocarbamol (ROBAXIN) IV 500 mg (08/09/21 1339)   PRN Meds: acetaminophen **OR** acetaminophen, albuterol, diazepam, HYDROmorphone (DILAUDID) injection, HYDROmorphone  Body mass index is 43.77 kg/m.        DVT Prophylaxis:   heparin injection 5,000 Units Start: 08/04/21 0600    Advance goals of care  discussion: Pt is Full code.  Family Communication: no family was present at bedside, at the time of interview.   Data Reviewed: I have personally reviewed and interpreted daily labs, tele strips, imaging. CBG elevated.  Pseudohyponatremia.  Electrolytes stable.  Hemoglobin stable.  No leukocytosis.  Physical Exam:  General: Appear in mild distress, no Rash; Oral Mucosa Clear, moist. no Abnormal Neck Mass Or lumps, Conjunctiva normal  Cardiovascular: S1 and S2 Present, no Murmur, Respiratory: good respiratory effort, Bilateral Air entry present and CTA, no Crackles, no wheezes Abdomen: Bowel Sound present, Soft and no tenderness Extremities: no Pedal edema, left AKA Neurology: alert and oriented to time, place, and person affect appropriate. no new focal deficit Gait not checked due to patient safety concerns   Vitals:   08/09/21 1727 08/09/21 1807 08/09/21 1834 08/09/21 2100  BP: (!) 162/82 (!) 146/72 (!) 146/72 (!) 141/82  Pulse: 90  96 98  Resp: (!) 22   18  Temp: 98.5 F (36.9 C)   98.4 F (36.9 C)  TempSrc:    Oral  SpO2: 95%   97%  Weight: 119.3 kg     Height:        Disposition:  Status is: Inpatient  Remains inpatient appropriate because:Ongoing active pain requiring inpatient pain management  Dispo: The patient is from: Home              Anticipated d/c is to: SNF              Patient currently is not medically stable to d/c.   Difficult to place patient No        Time spent: 35 minutes. I reviewed all nursing notes, pharmacy notes, vitals, pertinent old records. I have discussed plan of care as described above with RN.  Author: Berle Mull, MD [facility] 08/09/2021 9:48 PM  To reach On-call, see care teams to locate the attending and reach out via [email or web address]. Between 7PM-7AM, please contact night-coverage If you still have difficulty reaching the attending provider, please page the [facility] (Director on Call) for [facility] on amion for  assistance.

## 2021-08-10 ENCOUNTER — Encounter (HOSPITAL_COMMUNITY): Payer: Self-pay | Admitting: Surgery

## 2021-08-10 ENCOUNTER — Encounter (HOSPITAL_COMMUNITY)
Admission: EM | Disposition: A | Discharge status: Skilled Nursing Facility | Payer: Self-pay | Source: Home / Self Care | Attending: Internal Medicine

## 2021-08-10 DIAGNOSIS — T82898A Other specified complication of vascular prosthetic devices, implants and grafts, initial encounter: Secondary | ICD-10-CM

## 2021-08-10 DIAGNOSIS — E162 Hypoglycemia, unspecified: Secondary | ICD-10-CM | POA: Diagnosis not present

## 2021-08-10 DIAGNOSIS — N186 End stage renal disease: Secondary | ICD-10-CM

## 2021-08-10 HISTORY — PX: UPPER EXTREMITY VENOGRAPHY: CATH118272

## 2021-08-10 LAB — GLUCOSE, CAPILLARY
Glucose-Capillary: 368 mg/dL — ABNORMAL HIGH (ref 70–99)
Glucose-Capillary: 382 mg/dL — ABNORMAL HIGH (ref 70–99)
Glucose-Capillary: 416 mg/dL — ABNORMAL HIGH (ref 70–99)
Glucose-Capillary: 341 mg/dL — ABNORMAL HIGH (ref 70–99)
Glucose-Capillary: 262 mg/dL — ABNORMAL HIGH (ref 70–99)
Glucose-Capillary: 247 mg/dL — ABNORMAL HIGH (ref 70–99)
Glucose-Capillary: 282 mg/dL — ABNORMAL HIGH (ref 70–99)
Glucose-Capillary: 271 mg/dL — ABNORMAL HIGH (ref 70–99)
Glucose-Capillary: 249 mg/dL — ABNORMAL HIGH (ref 70–99)
Glucose-Capillary: 285 mg/dL — ABNORMAL HIGH (ref 70–99)
Glucose-Capillary: 257 mg/dL — ABNORMAL HIGH (ref 70–99)
Glucose-Capillary: 295 mg/dL — ABNORMAL HIGH (ref 70–99)
Glucose-Capillary: 301 mg/dL — ABNORMAL HIGH (ref 70–99)
Glucose-Capillary: 237 mg/dL — ABNORMAL HIGH (ref 70–99)
Glucose-Capillary: 236 mg/dL — ABNORMAL HIGH (ref 70–99)
Glucose-Capillary: 215 mg/dL — ABNORMAL HIGH (ref 70–99)
Glucose-Capillary: 233 mg/dL — ABNORMAL HIGH (ref 70–99)

## 2021-08-10 SURGERY — UPPER EXTREMITY VENOGRAPHY
Anesthesia: LOCAL | Laterality: Right

## 2021-08-10 MED ORDER — HYDROMORPHONE HCL 1 MG/ML IJ SOLN
0.5000 mg | Freq: Once | INTRAMUSCULAR | Status: AC
  Administered 2021-08-10: 0.5 mg via INTRAVENOUS
  Filled 2021-08-10: qty 1

## 2021-08-10 MED ORDER — IODIXANOL 320 MG/ML IV SOLN
INTRAVENOUS | Status: DC | PRN
  Administered 2021-08-10: 40 mL

## 2021-08-10 MED ORDER — DEXTROSE IN LACTATED RINGERS 5 % IV SOLN: INTRAVENOUS | Status: DC

## 2021-08-10 MED ORDER — INSULIN ASPART 100 UNIT/ML IJ SOLN
10.0000 [IU] | Freq: Once | INTRAMUSCULAR | Status: AC
  Administered 2021-08-10: 10 [IU] via SUBCUTANEOUS

## 2021-08-10 MED ORDER — INSULIN REGULAR(HUMAN) IN NACL 100-0.9 UT/100ML-% IV SOLN
INTRAVENOUS | Status: DC
  Administered 2021-08-10: 17 [IU]/h via INTRAVENOUS
  Administered 2021-08-10: 16 [IU]/h via INTRAVENOUS
  Administered 2021-08-10: 11 [IU]/h via INTRAVENOUS
  Administered 2021-08-11: 20 [IU]/h via INTRAVENOUS
  Administered 2021-08-11: 22 [IU]/h via INTRAVENOUS
  Administered 2021-08-11: 29 [IU]/h via INTRAVENOUS
  Administered 2021-08-11: 15 [IU]/h via INTRAVENOUS
  Filled 2021-08-10 (×8): qty 100

## 2021-08-10 MED ORDER — HEPARIN (PORCINE) IN NACL 1000-0.9 UT/500ML-% IV SOLN
INTRAVENOUS | Status: DC | PRN
  Administered 2021-08-10: 500 mL

## 2021-08-10 MED ORDER — METHOCARBAMOL 500 MG PO TABS
500.0000 mg | ORAL_TABLET | Freq: Three times a day (TID) | ORAL | Status: DC
  Administered 2021-08-10 – 2021-08-17 (×20): 500 mg via ORAL
  Filled 2021-08-10 (×21): qty 1

## 2021-08-10 MED ORDER — TORSEMIDE 20 MG PO TABS
100.0000 mg | ORAL_TABLET | ORAL | Status: DC
  Administered 2021-08-12 – 2021-08-17 (×4): 100 mg via ORAL
  Filled 2021-08-10 (×4): qty 5

## 2021-08-10 MED ORDER — DEXTROSE 50 % IV SOLN: 0.0000 mL | INTRAVENOUS | Status: DC | PRN

## 2021-08-10 SURGICAL SUPPLY — 2 items
STOPCOCK MORSE 400PSI 3WAY (MISCELLANEOUS) ×1 IMPLANT
TUBING CIL FLEX 10 FLL-RA (TUBING) ×1 IMPLANT

## 2021-08-10 NOTE — Progress Notes (Signed)
No order for insulin has been put in. I paged the on call MD again and rechecked the CBG. CBG now 416. On call MD paged again at [place].

## 2021-08-10 NOTE — Progress Notes (Signed)
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  Presents presents with complaints of worsening pain on his right ankle.  He missed his hemodialysis secondary to this.  Was also hypoglycemic on arrival.  Currently plan is pain control, may require formal neurosurgery consultation if the pain is not improving.  Subjective: Continues to report severe pain.  No nausea no vomiting.  No fever or chills.  No diarrhea.  Assessment and Plan: 1.  Right ankle pain with ligamental injury Recent history of left AKA MRI negative for any acute fracture but shows attenuated talofibular ligament likely remote tear, mild lateral hindfoot impingement, edema showing sinus Tarsi syndrome, tibialis tenosynovitis and distal tendinopathy as well as flexor hallucis longus tenosynovitis and plantar fasciitis. Discussed with orthopedics. Cam walker boot recommended. Received 1 dose of colchicine. Pain controlled with Dilaudid, Robaxin, gabapentin. PT recommends SNF   2.  Hypo and hyperglycemia in the setting of type 2 diabetes mellitus. Uncontrolled with hyperand hypoglycemia with renal complication. Long-term insulin use. Glucose significantly low on admission.  Treated with IV D10. Patient was placed on a lower home regimen dose with he remained hyperglycemic. Due to patient being on Decadron currently on IV insulin.   3.  ESRD on HD MWF Vascular access issue Appreciate nephrology assistance Continue HD for now. Vascular surgery consulted.  Right upper extremity AV graft placement scheduled for Thursday of this week.   4.  Concern for right leg radiculopathy. Compression of the cauda equina nerve roots MRI L-spine performed.  Shows evidence of multilevel  degenerative disc disorder with impingement on L4 nerve roots as well as compression of the cauda equina nerve roots. I discussed with neurosurgery on-call on 9/12.  I was recommended to start the patient on IV Decadron 4 mg every 6 hours for 2 days and monitor response. If the pain is not improving formal neurosurgery consultation should be requested on 9/14. There is concern for marrow signal abnormality at L5-S1, but discitis or osteomyelitis not suspected clinically.  Blood cultures are negative.   HLD. Continue statin.  Mood disorder. Continue home regimen.  HTN. Continue home regimen.  Morbid obesity.  OSA. Continue CPAP.  Watch for side effects of pain medications. Body mass index is 43.77 kg/m.   Left leg AKA due to septic arthritis in  TKR from MRSA. Treated with vancomycin through 6/18.  Bilateral rotator cuff injury. Patient scheduled for right rotator cuff surgery in October. Now that might have to be postponed secondary to his need for being on steroids.   DVT Prophylaxis:   heparin injection 5,000 Units Start: 08/04/21 0600    Pt is Full code.  Communication: no family was present at bedside, at the time of interview.   Data Reviewed: I have personally reviewed and interpreted daily labs, tele strips, imaging. CBG improving.  Physical Exam:  General: Appear in mild distress, no Rash; Oral Mucosa Clear, moist. no Abnormal Neck Mass Or lumps, Conjunctiva normal  Cardiovascular: S1 and S2 Present, no Murmur, Respiratory: good respiratory effort, Bilateral Air entry present and CTA, no Crackles, no wheezes Abdomen: Bowel Sound present, Soft and no tenderness Extremities: no Pedal edema, left leg AKA Neurology: alert and oriented to time, place, and person affect  appropriate. no new focal deficit Gait not checked due to patient safety concerns   Vitals:   08/10/21 0903 08/10/21 1259 08/10/21 1622 08/10/21 2052  BP: (!) 150/90 (!) 141/89 (!) 164/85 (!)  157/86  Pulse: 85 94 88 91  Resp: '14 16 18 19  '$ Temp:  98.6 F (37 C)  98.6 F (37 C)  TempSrc:  Oral  Oral  SpO2: 98% 98%  98%  Weight:      Height:        Disposition:  Status is: Inpatient  Remains inpatient appropriate because:Ongoing active pain requiring inpatient pain management  Dispo: The patient is from: Home              Anticipated d/c is to: SNF              Patient currently is not medically stable to d/c.   Difficult to place patient No        Time spent: 35 minutes. I reviewed all nursing notes, pharmacy notes, vitals, pertinent old records. I have discussed plan of care as described above with RN.  Author: Berle Mull, MD [facility] 08/10/2021 9:49 PM  To reach On-call, see care teams to locate the attending and reach out via [email or web address]. Between 7PM-7AM, please contact night-coverage If you still have difficulty reaching the attending provider, please page the [facility] (Director on Call) for [facility] on amion for assistance.

## 2021-08-10 NOTE — Op Note (Signed)
    Patient name: Jeffrey [name] MRN: [id number] DOB: [birth year] Sex: male  08/03/2021 - 08/10/2021 Pre-operative Diagnosis: ESRD Post-operative diagnosis:  Same Surgeon:  Annamarie Major Procedure Performed:  1.  Right-sided central venogram    Indications: The patient is here today for access planning.  He has had a right upper arm loop graft which is occluded.  He has a catheter on the left side  Procedure:  The patient was identified in the holding area and taken to room 8.  The IV in the right arm was utilized for contrast administration and fluoroscopic images were obtained  Findings: Right axillary and subclavian vein are patent without any significant stenosis.  The right innominate vein appears to terminate near the catheter coming in from the left innominate vein.  There are images that appear to show contrast going past this area.  There were no prominent collaterals.    Impression:  #1  The right axillary, subclavian and innominate vein fully opacify.  No collaterals were visualized, however contrast appears to be held up where the catheter from the left side joints centrally.  The catheter is likely partially obstructive.   Theotis Burrow, M.D., [facility] [place] Office: [phone] Pager:  [phone]

## 2021-08-10 NOTE — Progress Notes (Signed)
Inpatient Diabetes Program Recommendations  AACE/ADA: New Consensus Statement on Inpatient Glycemic Control (2015)  Target Ranges:  Prepandial:   less than 140 mg/dL      Peak postprandial:   less than 180 mg/dL (1-2 hours)      Critically ill patients:  140 - 180 mg/dL   Lab Results  Component Value Date   GLUCAP 262 (H) 08/10/2021   HGBA1C 9.5 (H) 08/04/2021    Review of Glycemic Control Results for Jeffrey [name], Jeffrey [name] (MRN [id number]) as of 08/10/2021 12:03  Ref. Range 08/10/2021 01:22 08/10/2021 04:31 08/10/2021 05:41 08/10/2021 08:10 08/10/2021 10:51  Glucose-Capillary Latest Ref Range: 70 - 99 mg/dL 368 (H) 382 (H) 416 (H) 341 (H) 262 (H)   Diabetes history:  DM 2 Outpatient Diabetes medications:  U500 insulin  Non-Dialysis days- U500-130 units in the mornings and 135 units in the evening Dialysis days- U500-75 units in the morning and 125 units in the evening   Current orders for Inpatient glycemic control: IV insulin  Inpatient Diabetes Program Recommendations:   Received consult regarding diabetes regimen.  May restart dosages of U 500 that was patient was taking yesterday and adjust as needed. Patient has multiple variables coming off of IV insulin drip and will receive IV steroids x 2 days.  Thank you, Nani Gasser. Jaqlyn Gruenhagen, RN, MSN, CDE  Diabetes Coordinator Inpatient Glycemic Control Team Team Pager [phone] (8am-5pm) 08/10/2021 12:05 PM

## 2021-08-10 NOTE — Progress Notes (Signed)
Pt CBG 482 @ 0431. On call MD notified. Dr. Tonie Griffith stated he will order 10u novolog. Will administer once order is put in. Will continue to monitor pt.

## 2021-08-10 NOTE — Progress Notes (Signed)
    I have discussed proceeding with right upper extremity AV graft with the patient.  I have discussed the risk and benefits including steal particularly having had steal in the past as well as primary nonfunction which she is also experienced.  He may also require further procedures in the future.  We will plan right upper extremity AV graft on Thursday of this week.  Damarion Mendizabal C. Donzetta Matters, MD [facility] [place] Office: [phone] Pager: [phone]

## 2021-08-10 NOTE — Progress Notes (Signed)
Patient refused to allow blood draw for renal panel at this time.  Dr. Royce Macadamia notified.

## 2021-08-10 NOTE — Progress Notes (Signed)
Physical Therapy Treatment Patient Details Name: Jeffrey [name] MRN: [id number] DOB: July 14, 1961 Today's Date: 08/10/2021   History of Present Illness 60 yo male presents to [facility] on 9/6 with complaints of R-sided body spasms and L HD cath pain, missed HD on Monday, and hypoglycemia. Also with R ankle pain secondary to ligamental injury, MRI negative.  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    PT Comments    Pt was seen for mobility on side of bed with help, after assisting him back to bed from [facility].  Reviewed RLE exercises with gentle assist but is quite painful with some of them.  Titrated the effort, but pt is struggling with controlling the effort to better manage the pain. Follow along with him for progression of safe movement as tolerated, and to instruct on on safe transfers to the bed. Having a new HD AV graft on Thursday if schedule is maintained.   Recommendations for follow up therapy are one component of a multi-disciplinary discharge planning process, led by the attending physician.  Recommendations may be updated based on patient status, additional functional criteria and insurance authorization.  Follow Up Recommendations  SNF     Equipment Recommendations  None recommended by PT    Recommendations for Other Services       Precautions / Restrictions Precautions Precautions: Fall Precaution Comments: L AKA Restrictions Weight Bearing Restrictions: No     Mobility  Bed Mobility Overal bed mobility: Needs Assistance Bed Mobility: Supine to Sit;Sit to Supine Rolling: Supervision   Supine to sit: Supervision;HOB elevated Sit to supine: Min assist   General bed mobility comments: minor help to get LE on bed due to pain to return to bed    Transfers Overall transfer level: Needs assistance Equipment used: 2 person hand held assist Transfers: Stand Pivot Transfers   Stand pivot transfers: Mod assist;+2 physical assistance;+2  safety/equipment       General transfer comment: pt was up to bathe when PT arrived and assisted him to bed when CNA finished just then  Ambulation/Gait                 Stairs             Wheelchair Mobility    Modified Rankin (Stroke Patients Only)       Balance Overall balance assessment: Needs assistance Sitting-balance support: Single extremity supported Sitting balance-Leahy Scale: Fair   Postural control: Posterior lean Standing balance support: Bilateral upper extremity supported;During functional activity Standing balance-Leahy Scale: Poor Standing balance comment: painful due to having just stood to get to [facility] and then bathe                            Cognition Arousal/Alertness: Awake/alert Behavior During Therapy: WFL for tasks assessed/performed Overall Cognitive Status: Within Functional Limits for tasks assessed                                 General Comments: PTSD, affecting pain control and ability to set limits of activity      Exercises General Exercises - Lower Extremity Long Arc Quad: AAROM;Right;10 reps Heel Slides: AAROM;Right;10 reps Hip ABduction/ADduction: AROM;10 reps Hip Flexion/Marching: AAROM;Right;10 reps    General Comments General comments (skin integrity, edema, etc.): Pt was assisted to exercise on side of bed, hurting and had to insist he limit the activity  based on pain      Pertinent Vitals/Pain Pain Assessment: Faces Faces Pain Scale: Hurts even more Pain Location: R side Pain Descriptors / Indicators: Grimacing;Guarding Pain Intervention(s): Limited activity within patient's tolerance;Monitored during session;Repositioned;RN gave pain meds during session    Home Living                      Prior Function            PT Goals (current goals can now be found in the care plan section) Acute Rehab PT Goals Patient Stated Goal: get better Progress towards PT goals:  Progressing toward goals    Frequency    Min 2X/week      PT Plan Current plan remains appropriate    Co-evaluation              AM-PAC PT "6 Clicks" Mobility   Outcome Measure  Help needed turning from your back to your side while in a flat bed without using bedrails?: None Help needed moving from lying on your back to sitting on the side of a flat bed without using bedrails?: A Little Help needed moving to and from a bed to a chair (including a wheelchair)?: A Lot Help needed standing up from a chair using your arms (e.g., wheelchair or bedside chair)?: A Lot Help needed to walk in hospital room?: Total Help needed climbing 3-5 steps with a railing? : Total 6 Click Score: 13    End of Session Equipment Utilized During Treatment: Gait belt Activity Tolerance: Patient limited by pain Patient left: in bed;with call bell/phone within reach;with bed alarm set Nurse Communication: Mobility status PT Visit Diagnosis: Other abnormalities of gait and mobility (R26.89);Pain Pain - Right/Left: Right Pain - part of body: Shoulder;Hip;Leg;Knee;Ankle and joints of foot     Time: 1140-1211 PT Time Calculation (min) (ACUTE ONLY): 31 min  Charges:  $Therapeutic Exercise: 8-22 mins $Therapeutic Activity: 8-22 mins                   Ramond Dial 08/10/2021, 4:12 PM  Mee Hives, PT MS Acute Rehab Dept. Number: [place] and [place]

## 2021-08-10 NOTE — Progress Notes (Signed)
Patient taken to cath lab

## 2021-08-10 NOTE — Progress Notes (Addendum)
[facility] Progress Note   Subjective:    Patient seen and examined at bedside. Seen by VVS yesterday to evaluate malfunctioning RUE AVG. S/p upper extremity venogram today by Dr. Trula Slade to plan further access. Tolerated yesterday's HD with net UF 1.8L. Plan for HD 9/14.  Objective Vitals:   08/10/21 0848 08/10/21 0853 08/10/21 0858 08/10/21 0903  BP: (!) 143/94 (!) 153/84 (!) 148/80 (!) 150/90  Pulse: 82 78 77 85  Resp: 11 (!) 0 11 14  Temp:      TempSrc:      SpO2: 97% 96% 96% 98%  Weight:      Height:       Physical Exam General: Well-appearing male; NAD Heart: S1 and S2; No murmurs, gallops, or rubs Lungs: CTA; breathing unlabored Abdomen: Large, soft, and non-tender Extremities: L AKA; No edema RLE Dialysis Access:  TDC in use/R AVG malfunction (-) Bruit/Thrill  Filed Weights   08/06/21 1235 08/09/21 1354 08/09/21 1727  Weight: 123.1 kg 121.6 kg 119.3 kg    Intake/Output Summary (Last 24 hours) at 08/10/2021 0955 Last data filed at 08/10/2021 0300 Gross per 24 hour  Intake 100 ml  Output 2473 ml  Net -2373 ml    Additional Objective Labs: Basic Metabolic Panel: Recent Labs  Lab 08/04/21 0101 08/04/21 0122 08/04/21 0553 08/06/21 1714 08/09/21 0648  NA 141 141  --  132* 129*  K 3.1* 3.1*  --  3.3* 3.7  CL 94* 93*  --  91* 89*  CO2 32  --   --  26 25  GLUCOSE 26* 26*  --  230* 313*  BUN 130* >130*  --  57* 126*  CREATININE 3.93* 4.00* 4.07* 2.56* 5.01*  CALCIUM 8.7*  --   --  9.0 8.8*  PHOS  --   --   --  3.3 6.4*   Liver Function Tests: Recent Labs  Lab 08/06/21 1714 08/09/21 0648  ALBUMIN 4.2 3.5   No results for input(s): LIPASE, AMYLASE in the last 168 hours. CBC: Recent Labs  Lab 08/04/21 0101 08/04/21 0122 08/04/21 0553 08/06/21 1500 08/09/21 0648  WBC 16.4*  --  8.9 12.2* 9.0  NEUTROABS 12.4*  --   --   --   --   HGB 12.0*   < > 10.5* 12.8* 10.5*  HCT 38.5*   < > 34.2* 39.5 32.6*  MCV 87.3  --  89.1 85.9 83.8   PLT 234  --  143* 182 134*   < > = values in this interval not displayed.   Blood Culture    Component Value Date/Time   SDES BLOOD LEFT ARM 08/04/2021 1830   SPECREQUEST  08/04/2021 1830    BOTTLES DRAWN AEROBIC AND ANAEROBIC Blood Culture results may not be optimal due to an inadequate volume of blood received in culture bottles   CULT  08/04/2021 1830    NO GROWTH 5 DAYS Performed at [facility], [place] [address] [place], [place] [address]    REPTSTATUS 08/09/2021 FINAL 08/04/2021 1830    Cardiac Enzymes: No results for input(s): CKTOTAL, CKMB, CKMBINDEX, TROPONINI in the last 168 hours. CBG: Recent Labs  Lab 08/09/21 2145 08/10/21 0122 08/10/21 0431 08/10/21 0541 08/10/21 0810  GLUCAP 366* 368* 382* 416* 341*   Iron Studies: No results for input(s): IRON, TIBC, TRANSFERRIN, FERRITIN in the last 72 hours. Lab Results  Component Value Date   INR 1.2 03/28/2021   Studies/Results: MR LUMBAR SPINE WO CONTRAST  Result  Date: 08/09/2021 CLINICAL DATA:  Lumbar radiculopathy EXAM: MRI LUMBAR SPINE WITHOUT CONTRAST TECHNIQUE: Multiplanar, multisequence MR imaging of the lumbar spine was performed. No intravenous contrast was administered. COMPARISON:  None. FINDINGS: Segmentation:  Standard Alignment: There is straightening of the normal lumbar spine lordosis. There is no antero or retrolisthesis. Vertebrae: Vertebral body heights are preserved. Marrow signal is somewhat heterogeneous throughout. There is a 1.0 cm focus of T1 hypointensity in the L2 vertebral body. There is mild marrow signal abnormality with endplate edema at 075-GRM with associated endplate irregularity. Conus medullaris and cauda equina: Conus extends to the mid L1 level. Conus and cauda equina appear normal. Paraspinal and other soft tissues: The paraspinal soft tissues are unremarkable. T2 hyperintense renal lesions are incompletely characterized but likely reflects cysts. Disc levels: There is disc  desiccation and narrowing at L3-L4 through L5-S1, most advanced at L5-S1. There is multilevel facet arthropathy, most advanced at L2-L3 and L4-L5. There associated facet joint effusions at multiple levels, most prominent on the left at L2-L3. T12-L1: No significant spinal canal or neural foraminal stenosis. L1-L2: There is mild bilateral facet arthropathy without significant spinal canal or neural foraminal stenosis. L2-L3: There is a mild disc bulge, ligamentum flavum thickening, and bilateral facet arthropathy resulting in mild spinal canal stenosis with crowding of the left subarticular zone without evidence of nerve root impingement and mild bilateral neural foraminal stenosis. L3-L4: There is a diffuse disc bulge, ligamentum flavum thickening, degenerative endplate change, and bilateral facet arthropathy resulting in mild spinal canal stenosis with crowding of the left subarticular zone without evidence of nerve root impingement and mild to moderate bilateral neural foraminal stenosis. L4-L5: There is a prominent disc bulge, ligamentum flavum thickening, prominent dorsal epidural fat, and bilateral facet arthropathy resulting in severe spinal canal stenosis with compression of the cauda equina nerve roots and impingement of the descending left nerve roots in the subarticular zone and severe right and moderate left neural foraminal stenosis with impingement of the exiting right L4 nerve root. L5-S1: This level was not imaged in the axial plane, degrading evaluation. There is a diffuse disc bulge, degenerative endplate change, and bilateral facet arthropathy resulting in mild spinal canal stenosis and severe bilateral neural foraminal stenosis. IMPRESSION: 1. Marrow signal abnormality at L5-S1 with associated endplate irregularity is most likely degenerative in nature; however, infection could have a similar appearance given elevated inflammatory markers. If discitis/osteomyelitis is of concern, recommend  postcontrast imaging of the lumbar spine. 2. Degenerative changes at L4-L5 results in severe spinal canal stenosis with compression of the cauda equina nerve roots and impingement of the descending left nerve roots in the subarticular zone, and severe right and moderate left neural foraminal stenosis with impingement of the exiting right L4 nerve root. 3. Severe bilateral neural foraminal stenosis at L5-S1. 4. Crowding of the left subarticular zones at L2-L3 and L3-L4 without evidence of nerve root impingement. Varying degrees of spinal canal and neural foraminal stenosis at the remaining levels as detailed above. 5. Multilevel facet arthropathy with associated facet joint effusions, most prominent on the left at L2-L3. 6. 1.0 cm focus of T1 hypointensity in the L2 vertebral body is indeterminate. Correlation with prior imaging studies, if available, would be helpful to assess for stability. This can also be assessed at the time of postcontrast imaging of the lumbar spine. Electronically Signed   By: Valetta Mole M.D.   On: 08/09/2021 21:00   PERIPHERAL VASCULAR CATHETERIZATION  Result Date: 08/10/2021 Images from the original result  were not included. Patient name: Jeffrey [name] MRN: [id number] DOB: 1961/05/05 Sex: male 08/03/2021 - 08/10/2021 Pre-operative Diagnosis: ESRD Post-operative diagnosis:  Same Surgeon:  Annamarie Major Procedure Performed:  1.  Right-sided central venogram  Indications: The patient is here today for access planning.  He has had a right upper arm loop graft which is occluded.  He has a catheter on the left side Procedure:  The patient was identified in the holding area and taken to room 8.  The IV in the right arm was utilized for contrast administration and fluoroscopic images were obtained Findings: Right axillary and subclavian vein are patent without any significant stenosis.  The right innominate vein appears to terminate near the catheter coming in from the left innominate vein.  There  are images that appear to show contrast going past this area.  There were no prominent collaterals.  Impression:  #1  The right axillary, subclavian and innominate vein fully opacify.  No collaterals were visualized, however contrast appears to be held up where the catheter from the left side joints centrally.  The catheter is likely partially obstructive. Theotis Burrow, M.D., FACS [facility] [place] Office: [phone] Pager:  [phone]    Medications:  dextrose 5% lactated ringers     insulin     methocarbamol (ROBAXIN) IV 500 mg (08/10/21 [id number])    amLODipine  5 mg Oral QHS   aspirin EC  81 mg Oral Daily   atorvastatin  40 mg Oral Daily   calcium acetate  1,334 mg Oral TID with meals   carvedilol  12.5 mg Oral BID WC   Chlorhexidine Gluconate Cloth  6 each Topical Q0600   dexamethasone (DECADRON) injection  4 mg Intravenous Q6H   heparin  5,000 Units Subcutaneous Q8H   lidocaine  3 patch Transdermal Q24H   lidocaine  5 mL Other Once   metolazone  10 mg Oral Daily   mupirocin ointment   Nasal BID   nortriptyline  20 mg Oral QHS   pantoprazole  20 mg Oral Daily   pregabalin  25 mg Oral BID   senna-docusate  1 tablet Oral BID   sertraline  100 mg Oral Daily   torsemide  100 mg Oral BID    Dialysis Orders: [facility] 3h 26mn  450/ 1.5  124.5kg  2/2.5 bath  [facility] TDC/ (RUA AVG - not in use) - venofer '100mg'$  x 1 , rec'd only 1 dose on 07/30/21  Assessment/Plan: Hypoglycemia - per pmd, is on high dose insulin at home.  Right Ankle Pain-MRI (-) acute fracture but shows attenuated ligament likely remote tear, mild lateral hindfoot impingement, Tarsi syndrome, tibalis tenosynovitis, and distal tendinopathy . Continue current pain management and PT. PT recommends SNF. Managed by primary. Possible R Leg Radiculopathy-MRI spine performed: shows multilevel degenerative disc disorder with impingement on L4 nerve and compression of the cauda equina nerve root. Also  concern for marrow signal abnormality at L5-S1. Discitis/Osteomyelitis currectly not suspected. Managed by primary-patient to start on Decadron and Insulin drip ESRD - on HD MWF.  [place] Tol[place] yesterday's HD with net UF 1.8L. Patient currently under EDW. Plan to lower at discharge. Plan for HD 9/14. Cramping/Left leg pain - Cramping seems to be improving. Of note, he is taking large doses of loop diuretics and also metolazone, and is voiding large amounts daily.  Diuretics could be contributing to cramping. Patient noted to be hypokalemic-recent K+  now 3.7-may need to d/c Metolazone. Suspect the low Mg is due to the diuretics. MRI showed ligamental injury. Lidocaine patch helping. Per primary.  BP/ volume - BP mostly in goal with diuretics alone.  1.5kg under dry per weights yesterday.  Does not appear volume overloaded.  Euvolemic on exam, lower dry weight on d/c. Leukocytosis - afebrile.  Plain films of R foot were negative. WBC improving. DM2 - on high doses on SQ insulin at home. Glucoses up and down here. Per primary Anemia ckd - Hgb now 10.5.  Not on ESA-monitor trends. 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  Continue binder.  Nutrition - alb 4.2.  On carb modified diet.  Currently with hypokalemia, may need to increase K in bath on d/c if continues. R AVF malfunction- Seen by VVS yesterday. S/P upper extremity venogram by Dr. Trula Slade: right axillary, subclavian, and innominate vein fully opacify, catheter likely partially obstructive . Awaiting definitive plan.   Tobie Poet, NP [facility] 08/10/2021,9:55 AM  LOS: 5 days    Seen and examined independently.  Agree with note and exam as documented above by physician extender and as noted here.  General adult male in bed in no acute distress HEENT normocephalic atraumatic extraocular movements intact sclera anicteric Neck supple trachea midline Lungs clear to auscultation bilaterally  normal work of breathing at rest  Heart S1S2 no rub Abdomen soft nontender distended/obese habitus Extremities trace right leg edema and left AKA Psych normal mood and affect Neuro alert and oriented x 3 provides hx and follows commands Access left IJ tunn catheter  ESRD on MWF schedule. Renal panel ordered for today   R AVF thrombosed - VVS consulted. Had venogram today for access planning   HTN controlled    Anemia CKD - defer ESA  DM - per primary team. Pt currently on insulin gtt and dextrose.   Claudia Desanctis, MD 08/10/2021  1:45 PM

## 2021-08-10 NOTE — Interval H&P Note (Signed)
History and Physical Interval Note:  08/10/2021 7:33 AM  Jeffrey [name]  has presented today for surgery, with the diagnosis of instage renal.  The various methods of treatment have been discussed with the patient and family. After consideration of risks, benefits and other options for treatment, the patient has consented to  Procedure(s): UPPER EXTREMITY VENOGRAPHY (Right) as a surgical intervention.  The patient's history has been reviewed, patient examined, no change in status, stable for surgery.  I have reviewed the patient's chart and labs.  Questions were answered to the patient's satisfaction.     Annamarie Major

## 2021-08-10 NOTE — Progress Notes (Signed)
Requested to confirm pt's HD center and days/time. Pt receives out HD at [facility] on MWF with a 12:20 chair time. Info provided to CSW this am.   Melven Sartorius Renal Navigator [phone]

## 2021-08-11 DIAGNOSIS — N186 End stage renal disease: Secondary | ICD-10-CM | POA: Diagnosis not present

## 2021-08-11 DIAGNOSIS — I152 Hypertension secondary to endocrine disorders: Secondary | ICD-10-CM

## 2021-08-11 DIAGNOSIS — T82898A Other specified complication of vascular prosthetic devices, implants and grafts, initial encounter: Secondary | ICD-10-CM | POA: Diagnosis not present

## 2021-08-11 DIAGNOSIS — E1159 Type 2 diabetes mellitus with other circulatory complications: Secondary | ICD-10-CM | POA: Diagnosis not present

## 2021-08-11 DIAGNOSIS — E162 Hypoglycemia, unspecified: Secondary | ICD-10-CM | POA: Diagnosis not present

## 2021-08-11 LAB — GLUCOSE, CAPILLARY
Glucose-Capillary: 123 mg/dL — ABNORMAL HIGH (ref 70–99)
Glucose-Capillary: 132 mg/dL — ABNORMAL HIGH (ref 70–99)
Glucose-Capillary: 139 mg/dL — ABNORMAL HIGH (ref 70–99)
Glucose-Capillary: 151 mg/dL — ABNORMAL HIGH (ref 70–99)
Glucose-Capillary: 157 mg/dL — ABNORMAL HIGH (ref 70–99)
Glucose-Capillary: 163 mg/dL — ABNORMAL HIGH (ref 70–99)
Glucose-Capillary: 206 mg/dL — ABNORMAL HIGH (ref 70–99)
Glucose-Capillary: 220 mg/dL — ABNORMAL HIGH (ref 70–99)
Glucose-Capillary: 225 mg/dL — ABNORMAL HIGH (ref 70–99)
Glucose-Capillary: 225 mg/dL — ABNORMAL HIGH (ref 70–99)
Glucose-Capillary: 227 mg/dL — ABNORMAL HIGH (ref 70–99)
Glucose-Capillary: 233 mg/dL — ABNORMAL HIGH (ref 70–99)
Glucose-Capillary: 234 mg/dL — ABNORMAL HIGH (ref 70–99)
Glucose-Capillary: 244 mg/dL — ABNORMAL HIGH (ref 70–99)
Glucose-Capillary: 255 mg/dL — ABNORMAL HIGH (ref 70–99)
Glucose-Capillary: 259 mg/dL — ABNORMAL HIGH (ref 70–99)
Glucose-Capillary: 262 mg/dL — ABNORMAL HIGH (ref 70–99)
Glucose-Capillary: 298 mg/dL — ABNORMAL HIGH (ref 70–99)
Glucose-Capillary: 377 mg/dL — ABNORMAL HIGH (ref 70–99)
Glucose-Capillary: 501 mg/dL (ref 70–99)

## 2021-08-11 LAB — RENAL FUNCTION PANEL
Albumin: 3.6 g/dL (ref 3.5–5.0)
Anion gap: 16 — ABNORMAL HIGH (ref 5–15)
BUN: 110 mg/dL — ABNORMAL HIGH (ref 8–23)
CO2: 23 mmol/L (ref 22–32)
Calcium: 9.4 mg/dL (ref 8.9–10.3)
Chloride: 90 mmol/L — ABNORMAL LOW (ref 98–111)
Creatinine, Ser: 5.27 mg/dL — ABNORMAL HIGH (ref 0.61–1.24)
GFR, Estimated: 12 mL/min — ABNORMAL LOW (ref >60)
Glucose, Bld: 203 mg/dL — ABNORMAL HIGH (ref 70–99)
Phosphorus: 4.2 mg/dL (ref 2.5–4.6)
Potassium: 4.1 mmol/L (ref 3.5–5.1)
Sodium: 129 mmol/L — ABNORMAL LOW (ref 135–145)

## 2021-08-11 MED ORDER — HEPARIN SODIUM (PORCINE) 1000 UNIT/ML IJ SOLN
INTRAMUSCULAR | Status: AC
  Filled 2021-08-11: qty 5

## 2021-08-11 MED ORDER — CEFAZOLIN SODIUM-DEXTROSE 2-4 GM/100ML-% IV SOLN
2.0000 g | INTRAVENOUS | Status: AC
  Administered 2021-08-12: 2 g via INTRAVENOUS
  Filled 2021-08-11: qty 100

## 2021-08-11 MED ORDER — CEFAZOLIN SODIUM-DEXTROSE 1-4 GM/50ML-% IV SOLN: 1.0000 g | INTRAVENOUS | Status: DC

## 2021-08-11 MED ORDER — INSULIN ASPART 100 UNIT/ML IJ SOLN
0.0000 [IU] | INTRAMUSCULAR | Status: DC
Start: 2021-08-11 — End: 2021-08-12
  Administered 2021-08-11 – 2021-08-12 (×2): 15 [IU] via SUBCUTANEOUS

## 2021-08-11 MED ORDER — HEPARIN SODIUM (PORCINE) 1000 UNIT/ML IJ SOLN
INTRAMUSCULAR | Status: AC
  Filled 2021-08-11: qty 2

## 2021-08-11 MED ORDER — INSULIN REGULAR HUMAN (CONC) 500 UNIT/ML ~~LOC~~ SOPN
90.0000 [IU] | PEN_INJECTOR | Freq: Two times a day (BID) | SUBCUTANEOUS | Status: DC
  Administered 2021-08-11: 90 [IU] via SUBCUTANEOUS
  Filled 2021-08-11: qty 3

## 2021-08-11 NOTE — Progress Notes (Signed)
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  Presents presents with complaints of worsening pain on his right ankle.  He missed his hemodialysis secondary to this.  Was also hypoglycemic on arrival. Currently plan is pain control, may require formal neurosurgery consultation if the pain is not improving.  Subjective:  Continues to c/o severe pain in RLE. Denies any new complains     Assessment and Plan:  Right ankle pain with ligamental injury MRI negative for any acute fracture but shows attenuated talofibular ligament likely remote tear, mild lateral hindfoot impingement, edema showing sinus Tarsi syndrome, tibialis tenosynovitis and distal tendinopathy as well as flexor hallucis longus tenosynovitis and plantar fasciitis. Discussed with orthopedics, Cam walker boot recommended. Pain controlled with Dilaudid, Robaxin, gabapentin. PT recommends SNF  Concern for right leg radiculopathy Compression of the cauda equina nerve roots MRI L-spine performed.  Shows evidence of multilevel degenerative disc disorder with impingement on L4 nerve roots as well as compression of the cauda equina nerve roots. Neurosurgery on-call was contacted on 9/12, recommended to start the patient on IV Decadron 4 mg every 6 hours for 2 days and monitor response. Pain is not improving, will consult neurosurgery There is concern for marrow signal abnormality at L5-S1, but discitis or osteomyelitis not suspected clinically.  Blood cultures are negative.   Hypo and hyperglycemia in the setting of type 2 diabetes mellitus. Uncontrolled with hyper and hypoglycemia with renal complication. Long-term insulin use. Glucose significantly low on admission.  Treated with IV  D10. Patient was placed on a lower home regimen dose with he remained hyperglycemic. Due to patient being on Decadron, IV insulin was started, currently d/ced Continue U500 90U BID, SSI q4h Monitor closely   ESRD on HD MWF Vascular access issue Appreciate nephrology assistance Continue HD as per nephrology Vascular surgery consulted, plan for Right upper extremity AV graft placement scheduled for 08/12/21  HLD Continue statin  Mood disorder Continue home regimen  HTN Continue home regimen.  Morbid obesity OSA Continue CPAP.  Watch for side effects of pain medications. Body mass index is 43.77 kg/m.   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 Treated with vancomycin through 6/18.  Bilateral rotator cuff injury. Patient scheduled for right rotator cuff surgery in October   DVT Prophylaxis:   heparin injection 5,000 Units Start: 08/04/21 0600    Pt is Full code.  Communication: None at bedside   Physical Exam: General: NAD  Cardiovascular: S1, S2 present Respiratory: CTAB Abdomen: Soft, nontender, nondistended, bowel sounds present Musculoskeletal: No R pedal edema noted, L AKA Skin: Normal Psychiatry: Normal mood   Vitals:   08/11/21 0403 08/11/21 0753 08/11/21 0800 08/11/21 1633  BP: (!) 136/96  (!) 141/52   Pulse: 73   94  Resp: 19  15   Temp: 98 F (36.7 C)     TempSrc: Oral Oral    SpO2: 98%     Weight:      Height:        Disposition:  Status is: Inpatient  Remains inpatient appropriate because:Ongoing active pain requiring inpatient pain management  Dispo: The patient is from: Home  Anticipated d/c is to: SNF              Patient currently is not medically stable to d/c.   Difficult to place patient No   Author: Alma Friendly, MD [facility] 08/11/2021 6:27 PM

## 2021-08-11 NOTE — TOC Progression Note (Addendum)
Transition of Care [facility]) - Progression Note    Patient Details  Name: Jeffrey [name] MRN: [id number] Date of Birth: 1961-01-16  Transition of [facility]) CM/SW [place], [place] Phone Number: 08/11/2021, 12:43 PM  Clinical Narrative:     Olivia Mackie renal navigator confirmed patients seat time change was approved by patients HD center. Arrival time is at 10:30am for a 10:50am seat time. CSW informed Clarene Critchley with [facility] who confirmed they can accept patient for SNF placement when medically ready. CSW informed patient. CSW will start insurance authorization close to patient being medically ready.  CSW spoke with Clarene Critchley with [facility] who confirmed they can make SNF bed offer for patient if Dialysis time can change from 12:00pm seat time to a 10:30 or 10:55am. CSW spoke with patient and provided information. Patient is agreeable to changing seat time.CSW called Clarene Critchley with [facility] to inform her that patient is agreeable to changing seat time . CSW called and spoke with Olivia Mackie renal navigator and request seat time change for patient. Olivia Mackie will follow back up with CSW to see if we are able to change patients seat time. CSW will follow back up with Clarene Critchley with [facility] once confirmed if we can change patients seat time. CSW will continue to follow and assist with dc planning needs.  Expected Discharge Plan: [place] Barriers to Discharge: Continued Medical Work up, Ship broker  Expected Discharge Plan and Services Expected Discharge Plan: [place]       Living arrangements for the past 2 months: [place] Determinants of Health (SDOH) Interventions    Readmission Risk Interventions Readmission Risk Prevention Plan 04/07/2021  Transportation Screening Complete  Medication Review Press photographer) Referral to Pharmacy  PCP or Specialist appointment within 3-5 days of  discharge Complete  HRI or Home Care Consult Complete  SW Recovery Care/Counseling Consult Complete  Palliative Care Screening Not [place] Complete

## 2021-08-11 NOTE — Procedures (Signed)
Seen on dialysis.  Blood pressure 151/74.  Tunn catheter in use.  Tolerating goal.    He asked me if I knew who he was and yelled when I walked in the dialysis unit to assess another patient having an acute issue.  I stepped away to de-escalate after he held his hands up in front of his face and stated he didn't want to talk to me further today  Spoke with PA and nursing.  He has had clotting so we are going to TPA access and he got low dose heparin today.   Claudia Desanctis, MD 08/11/2021  8:50 AM

## 2021-08-11 NOTE — Progress Notes (Signed)
Inpatient Diabetes Program Recommendations  AACE/ADA: New Consensus Statement on Inpatient Glycemic Control (2015)  Target Ranges:  Prepandial:   less than 140 mg/dL      Peak postprandial:   less than 180 mg/dL (1-2 hours)      Critically ill patients:  140 - 180 mg/dL   Lab Results  Component Value Date   GLUCAP 225 (H) 08/11/2021   HGBA1C 9.5 (H) 08/04/2021    Review of Glycemic Control  Inpatient Diabetes Program Recommendations:   Consider for transition orders from IV insulin to subcutaneous: -U 500 90 units bid ac meals  -Novolog correction 0-15 units q 4 hrs. Secure chat sent to Dr. Horris Latino.  Will follow and review CBGs during hospitalization.  Thank you, Nani Gasser. Giordana Weinheimer, RN, MSN, CDE  Diabetes Coordinator Inpatient Glycemic Control Team Team Pager [phone] (8am-5pm) 08/11/2021 7:48 AM

## 2021-08-11 NOTE — Plan of Care (Signed)
  Problem: Education: Goal: Knowledge of General Education information will improve Description: Including pain rating scale, medication(s)/side effects and non-pharmacologic comfort measures Outcome: Progressing   Problem: Clinical Measurements: Goal: Ability to maintain clinical measurements within normal limits will improve Outcome: Progressing   Problem: Clinical Measurements: Goal: Diagnostic test results will improve Outcome: Progressing   Problem: Clinical Measurements: Goal: Respiratory complications will improve Outcome: Progressing   Problem: Nutrition: Goal: Adequate nutrition will be maintained Outcome: Progressing   Problem: Pain Managment: Goal: General experience of comfort will improve Outcome: Progressing   Problem: Safety: Goal: Ability to remain free from injury will improve Outcome: Progressing   Problem: Skin Integrity: Goal: Risk for impaired skin integrity will decrease Outcome: Progressing

## 2021-08-11 NOTE — Plan of Care (Signed)
  Problem: Health Behavior/Discharge Planning: Goal: Ability to manage health-related needs will improve Outcome: Progressing   Problem: Clinical Measurements: Goal: Ability to maintain clinical measurements within normal limits will improve Outcome: Progressing   Problem: Clinical Measurements: Goal: Diagnostic test results will improve Outcome: Progressing   Problem: Clinical Measurements: Goal: Respiratory complications will improve Outcome: Progressing   Problem: Clinical Measurements: Goal: Cardiovascular complication will be avoided Outcome: Progressing   Problem: Nutrition: Goal: Adequate nutrition will be maintained Outcome: Progressing   Problem: Pain Managment: Goal: General experience of comfort will improve Outcome: Progressing

## 2021-08-11 NOTE — Progress Notes (Addendum)
Requested to see if pt's HD time can be changed for snf placement. Message left for clinic manager, Elberta Fortis, at [facility] and will await a return call. Will provide update to CSW as soon as able.   Melven Sartorius Renal Navigator [phone]  Addendum at 2:11 pm: Spoke to [place], CM at [facility] to confirm that pt can receive out-pt HD on MWF at 10:30 at d/c. Elberta Fortis agreeable to this change in treatment time but states it is temporary while pt in rehab. Pt will need to arrive at 10:30 with chair time of 10:50. Days will remain MWF. Elberta Fortis advised pt may be stable for d/c end of the week per CSW. Update provided to [place], [place], as well.

## 2021-08-11 NOTE — Progress Notes (Signed)
  Progress Note    08/11/2021 1:31 PM 1 Day Post-Op  Subjective: No complaints today  Vitals:   08/11/21 0403 08/11/21 0800  BP: (!) 136/96 (!) 141/52  Pulse: 73   Resp: 19 15  Temp: 98 F (36.7 C)   SpO2: 98%     Physical Exam: Evaluated on dialysis Awake alert oriented Nonlabored respirations   CBC    Component Value Date/Time   WBC 9.0 08/09/2021 0648   RBC 3.89 (L) 08/09/2021 0648   HGB 10.5 (L) 08/09/2021 0648   HCT 32.6 (L) 08/09/2021 0648   PLT 134 (L) 08/09/2021 0648   MCV 83.8 08/09/2021 0648   MCH 27.0 08/09/2021 0648   MCHC 32.2 08/09/2021 0648   RDW 15.0 08/09/2021 0648   LYMPHSABS 2.6 08/04/2021 0101   MONOABS 1.2 (H) 08/04/2021 0101   EOSABS 0.1 08/04/2021 0101   BASOSABS 0.1 08/04/2021 0101    BMET    Component Value Date/Time   NA 129 (L) 08/11/2021 0105   K 4.1 08/11/2021 0105   CL 90 (L) 08/11/2021 0105   CO2 23 08/11/2021 0105   GLUCOSE 203 (H) 08/11/2021 0105   BUN 110 (H) 08/11/2021 0105   CREATININE 5.27 (H) 08/11/2021 0105   CALCIUM 9.4 08/11/2021 0105   GFRNONAA 12 (L) 08/11/2021 0105    INR    Component Value Date/Time   INR 1.2 03/28/2021 2149     Intake/Output Summary (Last 24 hours) at 08/11/2021 1331 Last data filed at 08/11/2021 1258 Gross per 24 hour  Intake 1253.57 ml  Output 1550 ml  Net -296.43 ml     Assessment/plan:  60 y.o. male is s/p right upper extremity venogram.  Plan will be for right arm AV graft tomorrow in the OR.  He is to be n.p.o. past midnight.   Shanena Pellegrino C. Donzetta Matters, MD [facility] Office: [phone] Pager: [phone]  08/11/2021 1:31 PM

## 2021-08-11 NOTE — Progress Notes (Addendum)
[facility] Progress Note   Subjective:    Patient seen and examined during HD. Currently tolerating UFG 2.5L. He expresses frustration with attempt of AM labs being drawn on medical unit. Denies SOB, CP, and ABD pain. R foot edema improving.  Objective Vitals:   08/11/21 0010 08/11/21 0403 08/11/21 0753 08/11/21 0800  BP: (!) 142/66 (!) 136/96  (!) 141/52  Pulse: 93 73    Resp: '20 19  15  '$ Temp: 98.2 F (36.8 C) 98 F (36.7 C)    TempSrc: Oral Oral Oral   SpO2: 98% 98%    Weight:      Height:       Physical Exam General: Well-appearing male; NAD Heart: S1 and S2; No murmurs, gallops, or rubs Lungs: CTA; breathing unlabored Abdomen: Large, soft, and non-tender Extremities: L AKA; No edema RLE Dialysis Access:  TDC in use/R AVG malfunction (-) Bruit/Thrill  Filed Weights   08/06/21 1235 08/09/21 1354 08/09/21 1727  Weight: 123.1 kg 121.6 kg 119.3 kg    Intake/Output Summary (Last 24 hours) at 08/11/2021 0908 Last data filed at 08/11/2021 0650 Gross per 24 hour  Intake 1934.33 ml  Output 1300 ml  Net 634.33 ml    Additional Objective Labs: Basic Metabolic Panel: Recent Labs  Lab 08/06/21 1714 08/09/21 0648 08/11/21 0105  NA 132* 129* 129*  K 3.3* 3.7 4.1  CL 91* 89* 90*  CO2 '26 25 23  '$ GLUCOSE 230* 313* 203*  BUN 57* 126* 110*  CREATININE 2.56* 5.01* 5.27*  CALCIUM 9.0 8.8* 9.4  PHOS 3.3 6.4* 4.2   Liver Function Tests: Recent Labs  Lab 08/06/21 1714 08/09/21 0648 08/11/21 0105  ALBUMIN 4.2 3.5 3.6   No results for input(s): LIPASE, AMYLASE in the last 168 hours. CBC: Recent Labs  Lab 08/06/21 1500 08/09/21 0648  WBC 12.2* 9.0  HGB 12.8* 10.5*  HCT 39.5 32.6*  MCV 85.9 83.8  PLT 182 134*   Blood Culture    Component Value Date/Time   SDES BLOOD LEFT ARM 08/04/2021 1830   SPECREQUEST  08/04/2021 1830    BOTTLES DRAWN AEROBIC AND ANAEROBIC Blood Culture results may not be optimal due to an inadequate volume of blood received  in culture bottles   CULT  08/04/2021 1830    NO GROWTH 5 DAYS Performed at [facility], [place] [address], [place], [place] [address]    REPTSTATUS 08/09/2021 FINAL 08/04/2021 1830    Cardiac Enzymes: No results for input(s): CKTOTAL, CKMB, CKMBINDEX, TROPONINI in the last 168 hours. CBG: Recent Labs  Lab 08/11/21 0359 08/11/21 0509 08/11/21 0549 08/11/21 0649 08/11/21 0825  GLUCAP 262* 233* 244* 225* 132*   Iron Studies: No results for input(s): IRON, TIBC, TRANSFERRIN, FERRITIN in the last 72 hours. Lab Results  Component Value Date   INR 1.2 03/28/2021   Studies/Results: MR LUMBAR SPINE WO CONTRAST  Result Date: 08/09/2021 CLINICAL DATA:  Lumbar radiculopathy EXAM: MRI LUMBAR SPINE WITHOUT CONTRAST TECHNIQUE: Multiplanar, multisequence MR imaging of the lumbar spine was performed. No intravenous contrast was administered. COMPARISON:  None. FINDINGS: Segmentation:  Standard Alignment: There is straightening of the normal lumbar spine lordosis. There is no antero or retrolisthesis. Vertebrae: Vertebral body heights are preserved. Marrow signal is somewhat heterogeneous throughout. There is a 1.0 cm focus of T1 hypointensity in the L2 vertebral body. There is mild marrow signal abnormality with endplate edema at 075-GRM with associated endplate irregularity. Conus medullaris and cauda equina: Conus extends to the mid  L1 level. Conus and cauda equina appear normal. Paraspinal and other soft tissues: The paraspinal soft tissues are unremarkable. T2 hyperintense renal lesions are incompletely characterized but likely reflects cysts. Disc levels: There is disc desiccation and narrowing at L3-L4 through L5-S1, most advanced at L5-S1. There is multilevel facet arthropathy, most advanced at L2-L3 and L4-L5. There associated facet joint effusions at multiple levels, most prominent on the left at L2-L3. T12-L1: No significant spinal canal or neural foraminal stenosis. L1-L2: There is mild  bilateral facet arthropathy without significant spinal canal or neural foraminal stenosis. L2-L3: There is a mild disc bulge, ligamentum flavum thickening, and bilateral facet arthropathy resulting in mild spinal canal stenosis with crowding of the left subarticular zone without evidence of nerve root impingement and mild bilateral neural foraminal stenosis. L3-L4: There is a diffuse disc bulge, ligamentum flavum thickening, degenerative endplate change, and bilateral facet arthropathy resulting in mild spinal canal stenosis with crowding of the left subarticular zone without evidence of nerve root impingement and mild to moderate bilateral neural foraminal stenosis. L4-L5: There is a prominent disc bulge, ligamentum flavum thickening, prominent dorsal epidural fat, and bilateral facet arthropathy resulting in severe spinal canal stenosis with compression of the cauda equina nerve roots and impingement of the descending left nerve roots in the subarticular zone and severe right and moderate left neural foraminal stenosis with impingement of the exiting right L4 nerve root. L5-S1: This level was not imaged in the axial plane, degrading evaluation. There is a diffuse disc bulge, degenerative endplate change, and bilateral facet arthropathy resulting in mild spinal canal stenosis and severe bilateral neural foraminal stenosis. IMPRESSION: 1. Marrow signal abnormality at L5-S1 with associated endplate irregularity is most likely degenerative in nature; however, infection could have a similar appearance given elevated inflammatory markers. If discitis/osteomyelitis is of concern, recommend postcontrast imaging of the lumbar spine. 2. Degenerative changes at L4-L5 results in severe spinal canal stenosis with compression of the cauda equina nerve roots and impingement of the descending left nerve roots in the subarticular zone, and severe right and moderate left neural foraminal stenosis with impingement of the exiting  right L4 nerve root. 3. Severe bilateral neural foraminal stenosis at L5-S1. 4. Crowding of the left subarticular zones at L2-L3 and L3-L4 without evidence of nerve root impingement. Varying degrees of spinal canal and neural foraminal stenosis at the remaining levels as detailed above. 5. Multilevel facet arthropathy with associated facet joint effusions, most prominent on the left at L2-L3. 6. 1.0 cm focus of T1 hypointensity in the L2 vertebral body is indeterminate. Correlation with prior imaging studies, if available, would be helpful to assess for stability. This can also be assessed at the time of postcontrast imaging of the lumbar spine. Electronically Signed   By: Valetta Mole M.D.   On: 08/09/2021 21:00   PERIPHERAL VASCULAR CATHETERIZATION  Result Date: 08/10/2021 Images from the original result were not included. Patient name: Vanessa Gunkel MRN: [id number] DOB: 02/20/1961 Sex: male 08/03/2021 - 08/10/2021 Pre-operative Diagnosis: ESRD Post-operative diagnosis:  Same Surgeon:  Annamarie Major Procedure Performed:  1.  Right-sided central venogram  Indications: The patient is here today for access planning.  He has had a right upper arm loop graft which is occluded.  He has a catheter on the left side Procedure:  The patient was identified in the holding area and taken to room 8.  The IV in the right arm was utilized for contrast administration and fluoroscopic images were obtained Findings: Right axillary and  subclavian vein are patent without any significant stenosis.  The right innominate vein appears to terminate near the catheter coming in from the left innominate vein.  There are images that appear to show contrast going past this area.  There were no prominent collaterals.  Impression:  #1  The right axillary, subclavian and innominate vein fully opacify.  No collaterals were visualized, however contrast appears to be held up where the catheter from the left side joints centrally.  The catheter is likely  partially obstructive. Theotis Burrow, M.D., FACS [facility] Office: [phone] Pager:  [phone]    Medications:  [START ON 08/12/2021]  ceFAZolin (ANCEF) IV     dextrose 5% lactated ringers 30 mL/hr at 08/11/21 0600   insulin 15 Units/hr (08/11/21 0857)    amLODipine  5 mg Oral QHS   aspirin EC  81 mg Oral Daily   atorvastatin  40 mg Oral Daily   calcium acetate  1,334 mg Oral TID with meals   carvedilol  12.5 mg Oral BID WC   Chlorhexidine Gluconate Cloth  6 each Topical Q0600   dexamethasone (DECADRON) injection  4 mg Intravenous Q6H   heparin  5,000 Units Subcutaneous Q8H   heparin sodium (porcine)       heparin sodium (porcine)       lidocaine  3 patch Transdermal Q24H   lidocaine  5 mL Other Once   methocarbamol  500 mg Oral TID   metolazone  10 mg Oral Daily   mupirocin ointment   Nasal BID   nortriptyline  20 mg Oral QHS   pantoprazole  20 mg Oral Daily   pregabalin  25 mg Oral BID   senna-docusate  1 tablet Oral BID   sertraline  100 mg Oral Daily   [START ON 08/12/2021] torsemide  100 mg Oral Q T,Th,S,Su    Dialysis Orders: [facility]-M[facility] 3h 22mn  450/ 1.5  124.5kg  2/2.5 bath  [facility] TDC/ (RUA AVG - not in use) - venofer '100mg'$  x 1 , rec'd only 1 dose on 07/30/21  Assessment/Plan: Hypoglycemia/Hyperglycemia/DM2- blood sugars uncontrolled. In 20s at admit (treated with D10) now in 200-300s. On insulin drip (on Decadron). On high doses SQ insulin at home-managed by primary.  Right Ankle Pain-MRI (-) acute fracture but shows attenuated ligament likely remote tear, mild lateral hindfoot impingement, Tarsi syndrome, tibalis tenosynovitis, and distal tendinopathy . Previously received colchicine X 1. Continue current pain management and PT. PT recommends SNF. Managed by primary. Possible R Leg Radiculopathy-MRI spine performed: shows multilevel degenerative disc disorder with impingement on L4 nerve and compression of the cauda equina nerve root.  Also concern for marrow signal abnormality at L5-S1. Discitis/Osteomyelitis currectly not suspected. Managed by primary-patient to start on Decadron and Insulin drip. Plan for primary to consult neurosurgery if pain continues to be uncontrolled. ESRD - on HD MWF.  [place]. Plan to lower at discharge. HD today. Cramping/Left leg pain - Cramping improving. Of note, he is taking large doses of loop diuretics and also metolazone, and is voiding large amounts daily. Recent K+ now 4.1. MRI showed ligamental injury. Lidocaine patch helping. Per primary.  BP/ volume - BP mostly in goal with diuretics alone. Currently under EDW. Does not appear volume overloaded.  Euvolemic on exam, lower dry weight on d/c. Leukocytosis - afebrile.  Plain films of R foot were negative. WBC improving. Anemia ckd -  Hgb now 10.5.  Not on ESA-monitor trends. 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  Continue binder.  Nutrition - alb 4.2.  On carb modified diet. K+ improving with 3K bath, may need to increase K in bath on d/c if continues. R AVF malfunction- Seen by [facility] yesterday. S/P upper extremity venogram by Dr. Trula Slade: right axillary, subclavian, and innominate vein fully opacify, catheter likely partially obstructive. Seen by Dr. Donzetta Matters yesterday. Plan for R AVG placement tomorrow 9/15.    Tobie Poet, NP [facility] 08/11/2021,9:08 AM  LOS: 6 days    Seen and examined independently.  Agree with note and exam as documented above by physician extender and as noted here.  See also HD procedure note from today.  Appreciate vascular - note plan for AVG tomorrow  Claudia Desanctis, MD 08/11/2021  12:06 PM

## 2021-08-11 NOTE — Progress Notes (Signed)
Pt arrived in HD unit, A&Ox4, respirations even and unlabored with NAD noted. Orders, machine, and consent verified Telemetry box on patient; MT notified. Pt stable for tx with UF goal of 2500 ml including prime/rinse. Pt was agitated, staff able to redirect patient, and begin tx.  Catheter dressing to Left Chest wall c/d/I on arrival, +blood return, no resistance.    HD tx completed; pt tolerated well. Net UF removal 2000 ml. BVP 45.8/ KeCN 152. Post assessment completed. Baseline maintained; VS within normal limits. Blood returned per policy. CVC lumens blocked with Heparin. Catheter clamped and caps attached. Pt denies pain or further needs.

## 2021-08-11 NOTE — H&P (View-Only) (Signed)
  Progress Note    08/11/2021 1:31 PM 1 Day Post-Op  Subjective: No complaints today  Vitals:   08/11/21 0403 08/11/21 0800  BP: (!) 136/96 (!) 141/52  Pulse: 73   Resp: 19 15  Temp: 98 F (36.7 C)   SpO2: 98%     Physical Exam: Evaluated on dialysis Awake alert oriented Nonlabored respirations   CBC    Component Value Date/Time   WBC 9.0 08/09/2021 0648   RBC 3.89 (L) 08/09/2021 0648   HGB 10.5 (L) 08/09/2021 0648   HCT 32.6 (L) 08/09/2021 0648   PLT 134 (L) 08/09/2021 0648   MCV 83.8 08/09/2021 0648   MCH 27.0 08/09/2021 0648   MCHC 32.2 08/09/2021 0648   RDW 15.0 08/09/2021 0648   LYMPHSABS 2.6 08/04/2021 0101   MONOABS 1.2 (H) 08/04/2021 0101   EOSABS 0.1 08/04/2021 0101   BASOSABS 0.1 08/04/2021 0101    BMET    Component Value Date/Time   NA 129 (L) 08/11/2021 0105   K 4.1 08/11/2021 0105   CL 90 (L) 08/11/2021 0105   CO2 23 08/11/2021 0105   GLUCOSE 203 (H) 08/11/2021 0105   BUN 110 (H) 08/11/2021 0105   CREATININE 5.27 (H) 08/11/2021 0105   CALCIUM 9.4 08/11/2021 0105   GFRNONAA 12 (L) 08/11/2021 0105    INR    Component Value Date/Time   INR 1.2 03/28/2021 2149     Intake/Output Summary (Last 24 hours) at 08/11/2021 1331 Last data filed at 08/11/2021 1258 Gross per 24 hour  Intake 1253.57 ml  Output 1550 ml  Net -296.43 ml     Assessment/plan:  60 y.o. male is s/p right upper extremity venogram.  Plan will be for right arm AV graft tomorrow in the OR.  He is to be n.p.o. past midnight.   Nihal Doan C. Donzetta Matters, MD [facility] Office: [phone] Pager: [phone]  08/11/2021 1:31 PM

## 2021-08-11 NOTE — Progress Notes (Signed)
Pharmacy Home Medication Reconciliation Communication High Risk Medication: Humulin U-500 Insulin  Home dose of Humulin U-500 insulin was reordered for this patient. The dose was verified with the patient as listed below:  Type of syringe used at home:  [facility] Number or line on syringe to which patient draws up insulin: NA  This equates to 140 units AM TTSS, 80-120 units PM on TTSS, then 70 units BID on MWF (1/2 dose) units of U-500 insulin  Other comments pertinent to patient home dosing:  [facility] Dr. Paticia Stack.   Anayelli Lai S. Alford Highland, PharmD, BCPS Clinical Staff Pharmacist [email or web address]  Wayland Salinas 08/11/2021  4:25 PM

## 2021-08-12 ENCOUNTER — Inpatient Hospital Stay (HOSPITAL_COMMUNITY): Payer: Medicare Other | Admitting: Anesthesiology

## 2021-08-12 ENCOUNTER — Encounter (HOSPITAL_COMMUNITY)
Admission: EM | Disposition: A | Discharge status: Skilled Nursing Facility | Payer: Self-pay | Source: Home / Self Care | Attending: Internal Medicine

## 2021-08-12 DIAGNOSIS — E162 Hypoglycemia, unspecified: Secondary | ICD-10-CM | POA: Diagnosis not present

## 2021-08-12 DIAGNOSIS — N186 End stage renal disease: Secondary | ICD-10-CM | POA: Diagnosis not present

## 2021-08-12 DIAGNOSIS — Z992 Dependence on renal dialysis: Secondary | ICD-10-CM

## 2021-08-12 DIAGNOSIS — I152 Hypertension secondary to endocrine disorders: Secondary | ICD-10-CM | POA: Diagnosis not present

## 2021-08-12 DIAGNOSIS — E1122 Type 2 diabetes mellitus with diabetic chronic kidney disease: Secondary | ICD-10-CM | POA: Diagnosis not present

## 2021-08-12 DIAGNOSIS — E1159 Type 2 diabetes mellitus with other circulatory complications: Secondary | ICD-10-CM | POA: Diagnosis not present

## 2021-08-12 HISTORY — PX: AV FISTULA PLACEMENT: SHX1204

## 2021-08-12 LAB — GLUCOSE, CAPILLARY
Glucose-Capillary: 539 mg/dL (ref 70–99)
Glucose-Capillary: 457 mg/dL — ABNORMAL HIGH (ref 70–99)
Glucose-Capillary: 527 mg/dL (ref 70–99)
Glucose-Capillary: 437 mg/dL — ABNORMAL HIGH (ref 70–99)
Glucose-Capillary: 355 mg/dL — ABNORMAL HIGH (ref 70–99)
Glucose-Capillary: 391 mg/dL — ABNORMAL HIGH (ref 70–99)
Glucose-Capillary: 322 mg/dL — ABNORMAL HIGH (ref 70–99)
Glucose-Capillary: 237 mg/dL — ABNORMAL HIGH (ref 70–99)
Glucose-Capillary: 179 mg/dL — ABNORMAL HIGH (ref 70–99)
Glucose-Capillary: 154 mg/dL — ABNORMAL HIGH (ref 70–99)
Glucose-Capillary: 170 mg/dL — ABNORMAL HIGH (ref 70–99)
Glucose-Capillary: 133 mg/dL — ABNORMAL HIGH (ref 70–99)
Glucose-Capillary: 164 mg/dL — ABNORMAL HIGH (ref 70–99)
Glucose-Capillary: 212 mg/dL — ABNORMAL HIGH (ref 70–99)
Glucose-Capillary: 212 mg/dL — ABNORMAL HIGH (ref 70–99)
Glucose-Capillary: 200 mg/dL — ABNORMAL HIGH (ref 70–99)

## 2021-08-12 LAB — POCT I-STAT, CHEM 8
BUN: >130 mg/dL — ABNORMAL HIGH (ref 8–23)
Calcium, Ion: 1.12 mmol/L — ABNORMAL LOW (ref 1.15–1.40)
Chloride: 102 mmol/L (ref 98–111)
Creatinine, Ser: 3.9 mg/dL — ABNORMAL HIGH (ref 0.61–1.24)
Glucose, Bld: 209 mg/dL — ABNORMAL HIGH (ref 70–99)
HCT: 33 % — ABNORMAL LOW (ref 39.0–52.0)
Hemoglobin: 11.2 g/dL — ABNORMAL LOW (ref 13.0–17.0)
Potassium: 7.3 mmol/L (ref 3.5–5.1)
Sodium: 132 mmol/L — ABNORMAL LOW (ref 135–145)
TCO2: 29 mmol/L (ref 22–32)

## 2021-08-12 LAB — CBC
HCT: 30.5 % — ABNORMAL LOW (ref 39.0–52.0)
Hemoglobin: 10.2 g/dL — ABNORMAL LOW (ref 13.0–17.0)
MCH: 27.9 pg (ref 26.0–34.0)
MCHC: 33.4 g/dL (ref 30.0–36.0)
MCV: 83.6 fL (ref 80.0–100.0)
Platelets: 124 10*3/uL — ABNORMAL LOW (ref 150–400)
RBC: 3.65 MIL/uL — ABNORMAL LOW (ref 4.22–5.81)
RDW: 14.7 % (ref 11.5–15.5)
WBC: 10.5 10*3/uL (ref 4.0–10.5)
nRBC: 0 % (ref 0.0–0.2)

## 2021-08-12 LAB — BASIC METABOLIC PANEL
Anion gap: 14 (ref 5–15)
BUN: 90 mg/dL — ABNORMAL HIGH (ref 8–23)
CO2: 22 mmol/L (ref 22–32)
Calcium: 8.8 mg/dL — ABNORMAL LOW (ref 8.9–10.3)
Chloride: 94 mmol/L — ABNORMAL LOW (ref 98–111)
Creatinine, Ser: 4.09 mg/dL — ABNORMAL HIGH (ref 0.61–1.24)
GFR, Estimated: 16 mL/min — ABNORMAL LOW (ref >60)
Glucose, Bld: 588 mg/dL (ref 70–99)
Potassium: 4.5 mmol/L (ref 3.5–5.1)
Sodium: 130 mmol/L — ABNORMAL LOW (ref 135–145)

## 2021-08-12 SURGERY — INSERTION OF ARTERIOVENOUS (AV) GORE-TEX GRAFT ARM
Anesthesia: General | Laterality: Right

## 2021-08-12 MED ORDER — MIDAZOLAM HCL 2 MG/2ML IJ SOLN
INTRAMUSCULAR | Status: AC
  Filled 2021-08-12: qty 2

## 2021-08-12 MED ORDER — HEMOSTATIC AGENTS (NO CHARGE) OPTIME
TOPICAL | Status: DC | PRN
  Administered 2021-08-12: 1 via TOPICAL

## 2021-08-12 MED ORDER — FENTANYL CITRATE (PF) 100 MCG/2ML IJ SOLN
INTRAMUSCULAR | Status: AC
  Filled 2021-08-12: qty 2

## 2021-08-12 MED ORDER — INSULIN ASPART 100 UNIT/ML IJ SOLN
0.0000 [IU] | INTRAMUSCULAR | Status: DC
  Administered 2021-08-12: 3 [IU] via SUBCUTANEOUS
  Administered 2021-08-13: 15 [IU] via SUBCUTANEOUS
  Administered 2021-08-13: 5 [IU] via SUBCUTANEOUS
  Administered 2021-08-13 (×2): 8 [IU] via SUBCUTANEOUS
  Administered 2021-08-13: 3 [IU] via SUBCUTANEOUS
  Administered 2021-08-13: 11 [IU] via SUBCUTANEOUS
  Administered 2021-08-14 (×2): 8 [IU] via SUBCUTANEOUS
  Administered 2021-08-14: 11 [IU] via SUBCUTANEOUS
  Administered 2021-08-14: 2 [IU] via SUBCUTANEOUS
  Administered 2021-08-15: 5 [IU] via SUBCUTANEOUS
  Administered 2021-08-15: 8 [IU] via SUBCUTANEOUS
  Administered 2021-08-15: 3 [IU] via SUBCUTANEOUS
  Administered 2021-08-16 (×2): 8 [IU] via SUBCUTANEOUS
  Administered 2021-08-16: 5 [IU] via SUBCUTANEOUS
  Administered 2021-08-17: 11 [IU] via SUBCUTANEOUS
  Administered 2021-08-17: 5 [IU] via SUBCUTANEOUS
  Administered 2021-08-17 (×2): 11 [IU] via SUBCUTANEOUS
  Administered 2021-08-17: 8 [IU] via SUBCUTANEOUS

## 2021-08-12 MED ORDER — FENTANYL CITRATE (PF) 250 MCG/5ML IJ SOLN
INTRAMUSCULAR | Status: AC
  Filled 2021-08-12: qty 5

## 2021-08-12 MED ORDER — PHENYLEPHRINE HCL (PRESSORS) 10 MG/ML IV SOLN
INTRAVENOUS | Status: DC | PRN
  Administered 2021-08-12 (×2): 100 ug via INTRAVENOUS

## 2021-08-12 MED ORDER — SODIUM CHLORIDE 0.9 % IV SOLN: INTRAVENOUS | Status: DC | PRN

## 2021-08-12 MED ORDER — ONDANSETRON HCL 4 MG/2ML IJ SOLN: 4.0000 mg | Freq: Once | INTRAMUSCULAR | Status: DC | PRN

## 2021-08-12 MED ORDER — BUPIVACAINE LIPOSOME 1.3 % IJ SUSP
INTRAMUSCULAR | Status: AC
  Filled 2021-08-12: qty 20

## 2021-08-12 MED ORDER — LIDOCAINE-EPINEPHRINE (PF) 1 %-1:200000 IJ SOLN
INTRAMUSCULAR | Status: AC
  Filled 2021-08-12: qty 30

## 2021-08-12 MED ORDER — OXYCODONE HCL 5 MG/5ML PO SOLN: 5.0000 mg | Freq: Once | ORAL | Status: DC | PRN

## 2021-08-12 MED ORDER — OXYCODONE HCL 5 MG PO TABS: 5.0000 mg | ORAL_TABLET | Freq: Once | ORAL | Status: DC | PRN

## 2021-08-12 MED ORDER — BUPIVACAINE HCL (PF) 0.5 % IJ SOLN
INTRAMUSCULAR | Status: AC
  Filled 2021-08-12: qty 30

## 2021-08-12 MED ORDER — DEXTROSE IN LACTATED RINGERS 5 % IV SOLN: INTRAVENOUS | Status: DC

## 2021-08-12 MED ORDER — PROPOFOL 10 MG/ML IV BOLUS
INTRAVENOUS | Status: DC | PRN
  Administered 2021-08-12: 170 mg via INTRAVENOUS

## 2021-08-12 MED ORDER — CHLORHEXIDINE GLUCONATE 0.12 % MT SOLN
OROMUCOSAL | Status: AC
  Administered 2021-08-12: 15 mL via OROMUCOSAL
  Filled 2021-08-12: qty 15

## 2021-08-12 MED ORDER — INSULIN REGULAR HUMAN (CONC) 500 UNIT/ML ~~LOC~~ SOPN
90.0000 [IU] | PEN_INJECTOR | Freq: Two times a day (BID) | SUBCUTANEOUS | Status: DC
  Administered 2021-08-12 – 2021-08-17 (×11): 90 [IU] via SUBCUTANEOUS

## 2021-08-12 MED ORDER — INSULIN REGULAR(HUMAN) IN NACL 100-0.9 UT/100ML-% IV SOLN
INTRAVENOUS | Status: DC
  Administered 2021-08-12: 5.5 [IU]/h via INTRAVENOUS
  Administered 2021-08-12: 14 [IU]/h via INTRAVENOUS

## 2021-08-12 MED ORDER — SODIUM CHLORIDE 0.9 % IV SOLN: INTRAVENOUS | Status: DC

## 2021-08-12 MED ORDER — ONDANSETRON HCL 4 MG/2ML IJ SOLN
INTRAMUSCULAR | Status: DC | PRN
  Administered 2021-08-12: 4 mg via INTRAVENOUS

## 2021-08-12 MED ORDER — HEPARIN 6000 UNIT IRRIGATION SOLUTION
Status: AC
  Filled 2021-08-12: qty 500

## 2021-08-12 MED ORDER — FENTANYL CITRATE (PF) 100 MCG/2ML IJ SOLN
INTRAMUSCULAR | Status: DC | PRN
  Administered 2021-08-12: 50 ug via INTRAVENOUS

## 2021-08-12 MED ORDER — ORAL CARE MOUTH RINSE: 15.0000 mL | Freq: Once | OROMUCOSAL | Status: AC

## 2021-08-12 MED ORDER — LIDOCAINE HCL (CARDIAC) PF 100 MG/5ML IV SOSY
PREFILLED_SYRINGE | INTRAVENOUS | Status: DC | PRN
  Administered 2021-08-12: 30 mg via INTRAVENOUS

## 2021-08-12 MED ORDER — LACTATED RINGERS IV SOLN: INTRAVENOUS | Status: DC

## 2021-08-12 MED ORDER — HEPARIN SODIUM (PORCINE) 1000 UNIT/ML IJ SOLN
INTRAMUSCULAR | Status: DC | PRN
  Administered 2021-08-12: 3000 [IU] via INTRAVENOUS

## 2021-08-12 MED ORDER — PROTAMINE SULFATE 10 MG/ML IV SOLN
INTRAVENOUS | Status: DC | PRN
  Administered 2021-08-12 (×2): 10 mg via INTRAVENOUS
  Administered 2021-08-12: 5 mg via INTRAVENOUS

## 2021-08-12 MED ORDER — SODIUM CHLORIDE 0.9 % IV SOLN
INTRAVENOUS | Status: DC | PRN
  Administered 2021-08-12: 25 ug/min via INTRAVENOUS

## 2021-08-12 MED ORDER — BUPIVACAINE HCL (PF) 0.5 % IJ SOLN
INTRAMUSCULAR | Status: DC | PRN
  Administered 2021-08-12: 30 mL

## 2021-08-12 MED ORDER — 0.9 % SODIUM CHLORIDE (POUR BTL) OPTIME
TOPICAL | Status: DC | PRN
  Administered 2021-08-12: 1000 mL

## 2021-08-12 MED ORDER — CHLORHEXIDINE GLUCONATE 0.12 % MT SOLN: 15.0000 mL | Freq: Once | OROMUCOSAL | Status: AC

## 2021-08-12 MED ORDER — HEPARIN 6000 UNIT IRRIGATION SOLUTION
Status: DC | PRN
  Administered 2021-08-12: 1

## 2021-08-12 MED ORDER — DEXTROSE 50 % IV SOLN: 0.0000 mL | INTRAVENOUS | Status: DC | PRN

## 2021-08-12 MED ORDER — FENTANYL CITRATE (PF) 100 MCG/2ML IJ SOLN: 25.0000 ug | INTRAMUSCULAR | Status: DC | PRN

## 2021-08-12 MED ORDER — BUPIVACAINE LIPOSOME 1.3 % IJ SUSP
INTRAMUSCULAR | Status: DC | PRN
  Administered 2021-08-12: 20 mL

## 2021-08-12 MED ORDER — PROPOFOL 10 MG/ML IV BOLUS
INTRAVENOUS | Status: AC
  Filled 2021-08-12: qty 20

## 2021-08-12 SURGICAL SUPPLY — 37 items
ARMBAND PINK RESTRICT EXTREMIT (MISCELLANEOUS) ×4 IMPLANT
BAG COUNTER SPONGE SURGICOUNT (BAG) ×2 IMPLANT
CANISTER SUCT 3000ML PPV (MISCELLANEOUS) ×2 IMPLANT
CLIP VESOCCLUDE MED 6/CT (CLIP) ×2 IMPLANT
CLIP VESOCCLUDE SM WIDE 6/CT (CLIP) ×2 IMPLANT
DERMABOND ADVANCED (GAUZE/BANDAGES/DRESSINGS) ×1
DERMABOND ADVANCED .7 DNX12 (GAUZE/BANDAGES/DRESSINGS) ×1 IMPLANT
ELECT REM PT RETURN 9FT ADLT (ELECTROSURGICAL) ×2
ELECTRODE REM PT RTRN 9FT ADLT (ELECTROSURGICAL) ×1 IMPLANT
GLOVE SRG 8 PF TXTR STRL LF DI (GLOVE) ×1 IMPLANT
GLOVE SURG POLYISO LF SZ7.5 (GLOVE) ×2 IMPLANT
GLOVE SURG UNDER POLY LF SZ8 (GLOVE) ×1
GOWN STRL REUS W/ TWL LRG LVL3 (GOWN DISPOSABLE) ×2 IMPLANT
GOWN STRL REUS W/ TWL XL LVL3 (GOWN DISPOSABLE) ×1 IMPLANT
GOWN STRL REUS W/TWL LRG LVL3 (GOWN DISPOSABLE) ×2
GOWN STRL REUS W/TWL XL LVL3 (GOWN DISPOSABLE) ×1
GRAFT GORETEX STRT 4-7X45 (Vascular Products) ×1 IMPLANT
HEMOSTAT SNOW SURGICEL 2X4 (HEMOSTASIS) IMPLANT
KIT BASIN OR (CUSTOM PROCEDURE TRAY) ×2 IMPLANT
KIT TURNOVER KIT B (KITS) ×2 IMPLANT
NDL 18GX1X1/2 (RX/OR ONLY) (NEEDLE) ×1 IMPLANT
NDL SAFETY ECLIPSE 18X1.5 (NEEDLE) IMPLANT
NEEDLE 18GX1X1/2 (RX/OR ONLY) (NEEDLE) ×2 IMPLANT
NEEDLE HYPO 18GX1.5 SHARP (NEEDLE) ×1
NS IRRIG 1000ML POUR BTL (IV SOLUTION) ×2 IMPLANT
PACK CV ACCESS (CUSTOM PROCEDURE TRAY) ×2 IMPLANT
PAD ARMBOARD 7.5X6 YLW CONV (MISCELLANEOUS) ×4 IMPLANT
SURGIFLO W/THROMBIN 8M KIT (HEMOSTASIS) ×1 IMPLANT
SUT PROLENE 6 0 BV (SUTURE) ×4 IMPLANT
SUT VIC AB 3-0 SH 27 (SUTURE) ×2
SUT VIC AB 3-0 SH 27X BRD (SUTURE) ×2 IMPLANT
SUT VICRYL 4-0 PS2 18IN ABS (SUTURE) ×1 IMPLANT
SYR 30ML LL (SYRINGE) ×2 IMPLANT
SYR TOOMEY 50ML (SYRINGE) IMPLANT
TOWEL GREEN STERILE (TOWEL DISPOSABLE) ×2 IMPLANT
UNDERPAD 30X36 HEAVY ABSORB (UNDERPADS AND DIAPERS) ×2 IMPLANT
WATER STERILE IRR 1000ML POUR (IV SOLUTION) ×2 IMPLANT

## 2021-08-12 NOTE — Interval H&P Note (Signed)
History and Physical Interval Note:  08/12/2021 7:28 AM  Jeffrey [name]  has presented today for surgery, with the diagnosis of ESRD.  The various methods of treatment have been discussed with the patient and family. After consideration of risks, benefits and other options for treatment, the patient has consented to  Procedure(s): INSERTION OF ARTERIOVENOUS (AV) GORE-TEX GRAFT RIGHT ARM (Right) as a surgical intervention.  The patient's history has been reviewed, patient examined, no change in status, stable for surgery.  I have reviewed the patient's chart and labs.  Questions were answered to the patient's satisfaction.     Annamarie Major

## 2021-08-12 NOTE — Progress Notes (Signed)
Informed Dr. Hal Hope regarding the CBG result of 501 per sliding scale protocol. Patient had 2 slices of pizza around 2200H. Last dose of decadron iv given at 1900H. He ordered to follow the sliding scale and repeat blood sugar after 4 hours. Will monitor.

## 2021-08-12 NOTE — Progress Notes (Signed)
Pacu RN Report to floor given  Gave report to [employer], rm [phone]. Discussed surgery, meds given in OR and Pacu, VS, IV fluids given, EBL, urine output, pain and other pertinent information. Also discussed if pt had any family or friends here or belongings with them. Discussed Endo tool, last sugar 179, will adjust acoordinly.   Pt exits my care.

## 2021-08-12 NOTE — Progress Notes (Signed)
PT Cancellation Note  Patient Details Name: Jeffrey [name] MRN: [id number] DOB: Jan 19, 1961   Cancelled Treatment:    Reason Eval/Treat Not Completed: Pain limiting ability to participate. Coordinated with RN in regards to pain meds prior to session, but pt still in too much pain to participate at this time. Will plan to follow-up tomorrow as able.   [name], PT, DPT Acute Rehabilitation Services  Pager: [phone] Office: [place] 08/12/2021, 3:22 PM

## 2021-08-12 NOTE — Anesthesia Procedure Notes (Signed)
Procedure Name: LMA Insertion Date/Time: 08/12/2021 9:52 AM Performed by: Eligha Bridegroom, CRNA Pre-anesthesia Checklist: Patient identified, Emergency Drugs available, Suction available, Patient being monitored and Timeout performed Patient Re-evaluated:Patient Re-evaluated prior to induction Oxygen Delivery Method: Circle system utilized Preoxygenation: Pre-oxygenation with 100% oxygen Induction Type: IV induction LMA: LMA inserted LMA Size: 4.0 Number of attempts: 1 Placement Confirmation: positive ETCO2 and breath sounds checked- equal and bilateral Tube secured with: Tape Dental Injury: Teeth and Oropharynx as per pre-operative assessment

## 2021-08-12 NOTE — Anesthesia Postprocedure Evaluation (Signed)
Anesthesia Post Note  Patient: [name]  Procedure(s) Performed: INSERTION OF ARTERIOVENOUS (AV) GORE-TEX GRAFT RIGHT ARM (Right)     Patient location during evaluation: PACU Anesthesia Type: General Level of consciousness: sedated Pain management: pain level controlled Vital Signs Assessment: post-procedure vital signs reviewed and stable Respiratory status: spontaneous breathing and respiratory function stable Cardiovascular status: stable Postop Assessment: no apparent nausea or vomiting Anesthetic complications: no   No notable events documented.  Last Vitals:  Vitals:   08/12/21 1230 08/12/21 1312  BP:  (!) 148/73  Pulse:  86  Resp:    Temp:  37.1 C  SpO2: 93%     Last Pain:  Vitals:   08/12/21 1312  TempSrc: Oral  PainSc:                  Merlinda Frederick

## 2021-08-12 NOTE — Progress Notes (Addendum)
Paged vascular surgery. Pt right arm being to ooze and swell. Arm is very warm to the touch. On call vascular attending came to bedside and examined surgical site. Advised by attending to place gauze over surgical sites and wrap lightly with kurlex. Advised to leave the dressing on until surgeon comes to beside in the morning.

## 2021-08-12 NOTE — Progress Notes (Addendum)
[facility] Progress Note   Subjective:    Patient has returned to room on 6E. S/p RUE AVG placement by Dr. Trula Slade. Reports soreness to RUE. Otherwise no other complaints. Tolerated yesterday's HD with net UF 2L. Plan for HD 9/16.  Objective Vitals:   08/12/21 1200 08/12/21 1215 08/12/21 1230 08/12/21 1312  BP: (!) 159/84 (!) 154/84  (!) 148/73  Pulse: 85 87  86  Resp: 11 13    Temp:    98.8 F (37.1 C)  TempSrc:    Oral  SpO2: 97% 94% 93%   Weight:      Height:       Physical Exam General: Well-appearing male; NAD Heart: S1 and S2; No murmurs, gallops, or rubs Lungs: CTA; breathing unlabored Abdomen: Large, soft, and non-tender Extremities: L AKA; No edema RLE Dialysis Access:  [facility] /R AVG (+) bruit/thrill  Filed Weights   08/06/21 1235 08/09/21 1354 08/09/21 1727  Weight: 123.1 kg 121.6 kg 119.3 kg    Intake/Output Summary (Last 24 hours) at 08/12/2021 1448 Last data filed at 08/12/2021 1332 Gross per 24 hour  Intake 1064.93 ml  Output 1650 ml  Net -585.07 ml    Additional Objective Labs: Basic Metabolic Panel: Recent Labs  Lab 08/06/21 1714 08/09/21 0648 08/11/21 0105 08/12/21 0341  NA 132* 129* 129* 130*  K 3.3* 3.7 4.1 4.5  CL 91* 89* 90* 94*  CO2 '26 25 23 22  '$ GLUCOSE 230* 313* 203* 588*  BUN 57* 126* 110* 90*  CREATININE 2.56* 5.01* 5.27* 4.09*  CALCIUM 9.0 8.8* 9.4 8.8*  PHOS 3.3 6.4* 4.2  --    Liver Function Tests: Recent Labs  Lab 08/06/21 1714 08/09/21 0648 08/11/21 0105  ALBUMIN 4.2 3.5 3.6   No results for input(s): LIPASE, AMYLASE in the last 168 hours. CBC: Recent Labs  Lab 08/06/21 1500 08/09/21 0648 08/12/21 0341  WBC 12.2* 9.0 10.5  HGB 12.8* 10.5* 10.2*  HCT 39.5 32.6* 30.5*  MCV 85.9 83.8 83.6  PLT 182 134* 124*   Blood Culture    Component Value Date/Time   SDES BLOOD LEFT ARM 08/04/2021 1830   SPECREQUEST  08/04/2021 1830    BOTTLES DRAWN AEROBIC AND ANAEROBIC Blood Culture results may not be  optimal due to an inadequate volume of blood received in culture bottles   CULT  08/04/2021 1830    NO GROWTH 5 DAYS Performed at [facility], [place] [address], [place], [place] [address]    REPTSTATUS 08/09/2021 FINAL 08/04/2021 1830    Cardiac Enzymes: No results for input(s): CKTOTAL, CKMB, CKMBINDEX, TROPONINI in the last 168 hours. CBG: Recent Labs  Lab 08/12/21 0753 08/12/21 0854 08/12/21 1131 08/12/21 1255 08/12/21 1353  GLUCAP 322* 237* 179* 154* 170*   Iron Studies: No results for input(s): IRON, TIBC, TRANSFERRIN, FERRITIN in the last 72 hours. Lab Results  Component Value Date   INR 1.2 03/28/2021   Studies/Results: No results found.  Medications:  dextrose 5% lactated ringers 10 mL/hr at 08/12/21 1332   insulin 3.8 Units/hr (08/12/21 1415)   lactated ringers Stopped (08/12/21 1319)    amLODipine  5 mg Oral QHS   aspirin EC  81 mg Oral Daily   atorvastatin  40 mg Oral Daily   calcium acetate  1,334 mg Oral TID with meals   carvedilol  12.5 mg Oral BID WC   Chlorhexidine Gluconate Cloth  6 each Topical Q0600   heparin  5,000 Units Subcutaneous Q8H  insulin aspart  0-15 Units Subcutaneous Q4H   insulin regular human CONCENTRATED  90 Units Subcutaneous BID WC   lidocaine  3 patch Transdermal Q24H   lidocaine  5 mL Other Once   methocarbamol  500 mg Oral TID   metolazone  10 mg Oral Daily   mupirocin ointment   Nasal BID   nortriptyline  20 mg Oral QHS   pantoprazole  20 mg Oral Daily   pregabalin  25 mg Oral BID   senna-docusate  1 tablet Oral BID   sertraline  100 mg Oral Daily   torsemide  100 mg Oral Q T,Th,S,Su    Dialysis Orders: [facility]-MWF 3h 47mn  450/ 1.5  124.5kg  2/2.5 bath  [facility] TDC/ (RUA AVG - not in use) - venofer '100mg'$  x 1 , rec'd only 1 dose on 07/30/21  Assessment/Plan:  Hypoglycemia/Hyperglycemia/DM2- blood sugars uncontrolled. In 20s at admit (treated with D10) now in 200-300s. On insulin drip (on Decadron). On high doses SQ  insulin at home-managed by primary.  Right Ankle Pain-MRI (-) acute fracture but shows attenuated ligament likely remote tear, mild lateral hindfoot impingement, Tarsi syndrome, tibalis tenosynovitis, and distal tendinopathy . Previously received colchicine X 1. Continue current pain management and PT. PT recommends SNF. Managed by primary. Possible R Leg Radiculopathy-MRI spine performed: shows multilevel degenerative disc disorder with impingement on L4 nerve and compression of the cauda equina nerve root. Also concern for marrow signal abnormality at L5-S1. Discitis/Osteomyelitis currectly not suspected. Managed by primary-patient to start on Decadron and Insulin drip. Plan for primary to consult neurosurgery if pain continues to be uncontrolled. ESRD - on HD MWF.  [place] currently under EDW. Plan to lower at discharge. Tolerated yesterday's HD with net UF 2L. Plan for HD 9/16. Cramping/Left leg pain - Cramping improving. Of note, he is taking large doses of loop diuretics and also metolazone, and is voiding large amounts daily. Recent K+ now 4.5. MRI showed ligamental injury. Lidocaine patch helping. Per primary.  BP/ volume - BP mostly in goal with diuretics alone. Currently under EDW. Does not appear volume overloaded.  Euvolemic on exam, lower dry weight on d/c. Leukocytosis - afebrile.  Plain films of R foot were negative. WBC improving. Anemia ckd - Hgb now 10.2.  Not on ESA-monitor trends. 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  Continue binder.  Nutrition - alb 4.2.  On carb modified diet. K+ improving with 3K bath, may need to increase K in bath on d/c if continues. R AVF malfunction- S/p RUE AVG placement today by Dr[facility] B[facility]ene Churn [facility] 08/12/2021,2:48 PM  LOS: 7 days     Seen and examined independently.  Agree with note and exam as documented above by physician extender and as noted  here.  Seen and examined in PACU earlier today.  Patient is post-op from right arm AVG placement   ESRD - on HD MWF  RAVF malfunction s/p RUE AVG today   DM per primary team    LClaudia Desanctis MD 08/12/2021  3:12 PM

## 2021-08-12 NOTE — Consult Note (Signed)
Reason for Consult:lower extremity pain, right Referring Physician: Kaedon, Jeffrey is an 60 y.o. male.  HPI: whom presented to the hospital on 08/04/2021 secondary to body aches. He also had a CBG in the 30's, has ESRD, Diabetes mellitus ll, peripheral neuropathy, torn ligament in the right ankle, and had missed his dialysis. Reports the pain starts in his right ankle then travels up the leg, thigh, then eventually into is chest. 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 Jeffrey [name] states repeatedly that his pain started approximately a week before admission. He feels it in his stomach, and in the area around the hip. The neurosurgical team did speak with [facility] and recommended he be evaluated as an outpatient since no emergency nor urgent surgery would be performed. They also recommended steroids be administered. The hosptialist team felt a consult for pain management, along with the stenosis was warranted. He is currently receiving dilaudid orally, and iv if needed for pain. He receives valium for a muscle relaxant. He underwent a Right AV graft placement this morning, and complains appropriately of pain in the right upper extremity. He complains of pain in the right hip, knee, and ankle.  Past Medical History:  Diagnosis Date   Arthritis    Blood transfusion without reported diagnosis    CHF (congestive heart failure) (HCC)    COPD (chronic obstructive pulmonary disease) ([place])    Diabetes mellitus without complication ([place])    Hypertension    PTSD (post-traumatic stress disorder)    Renal disorder    Stage 5 Kidney Failure    Past Surgical History:  Procedure Laterality Date   AMPUTATION Left 04/03/2021   Procedure: LEFT ABOVE-THE-KNEE AMPUTATION;  Surgeon: Erle Crocker, MD;  Location: [facility];  Service: Orthopedics;  Laterality: Left;   IR REMOVAL TUN CV CATH W/O FL  03/30/2021   JOINT  REPLACEMENT     Bilateral knees   UPPER EXTREMITY VENOGRAPHY Right 08/10/2021   Procedure: UPPER EXTREMITY VENOGRAPHY;  Surgeon: Serafina Mitchell, MD;  Location: [facility];  Service: Cardiovascular;  Laterality: Right;    History reviewed. No pertinent family history.  Social History:  reports that he quit smoking about 10 years ago. His smoking use included cigarettes. He has never used smokeless tobacco. He reports current alcohol use. He reports that he does not currently use drugs.  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  Results for orders placed or performed during the hospital encounter of 08/03/21 (from the past 48 hour(s))  Glucose, capillary     Status: Abnormal   Collection Time: 08/10/21  4:11 PM  Result Value Ref Range   Glucose-Capillary 285 (H) 70 - 99 mg/dL    Comment: Glucose reference range applies only to samples taken after fasting for at least 8 hours.  Glucose, capillary     Status: Abnormal   Collection Time: 08/10/21  5:13 PM  Result Value Ref Range   Glucose-Capillary 257 (H) 70 - 99 mg/dL    Comment:  Glucose reference range applies only to samples taken after fasting for at least 8 hours.  Glucose, capillary     Status: Abnormal   Collection Time: 08/10/21  6:02 PM  Result Value Ref Range   Glucose-Capillary 295 (H) 70 - 99 mg/dL    Comment: Glucose reference range applies only to samples taken after fasting for at least 8 hours.  Glucose, capillary     Status: Abnormal   Collection Time: 08/10/21  6:52 PM  Result Value Ref Range    Glucose-Capillary 301 (H) 70 - 99 mg/dL    Comment: Glucose reference range applies only to samples taken after fasting for at least 8 hours.  Glucose, capillary     Status: Abnormal   Collection Time: 08/10/21  8:07 PM  Result Value Ref Range   Glucose-Capillary 237 (H) 70 - 99 mg/dL    Comment: Glucose reference range applies only to samples taken after fasting for at least 8 hours.  Glucose, capillary     Status: Abnormal   Collection Time: 08/10/21  8:44 PM  Result Value Ref Range   Glucose-Capillary 236 (H) 70 - 99 mg/dL    Comment: Glucose reference range applies only to samples taken after fasting for at least 8 hours.  Glucose, capillary     Status: Abnormal   Collection Time: 08/10/21 10:00 PM  Result Value Ref Range   Glucose-Capillary 215 (H) 70 - 99 mg/dL    Comment: Glucose reference range applies only to samples taken after fasting for at least 8 hours.  Glucose, capillary     Status: Abnormal   Collection Time: 08/10/21 10:57 PM  Result Value Ref Range   Glucose-Capillary 233 (H) 70 - 99 mg/dL    Comment: Glucose reference range applies only to samples taken after fasting for at least 8 hours.  Glucose, capillary     Status: Abnormal   Collection Time: 08/11/21 12:03 AM  Result Value Ref Range   Glucose-Capillary 220 (H) 70 - 99 mg/dL    Comment: Glucose reference range applies only to samples taken after fasting for at least 8 hours.  Renal function panel     Status: Abnormal   Collection Time: 08/11/21  1:05 AM  Result Value Ref Range   Sodium 129 (L) 135 - 145 mmol/L   Potassium 4.1 3.5 - 5.1 mmol/L   Chloride 90 (L) 98 - 111 mmol/L   CO2 23 22 - 32 mmol/L   Glucose, Bld 203 (H) 70 - 99 mg/dL    Comment: Glucose reference range applies only to samples taken after fasting for at least 8 hours.   BUN 110 (H) 8 - 23 mg/dL   Creatinine, Ser 5.27 (H) 0.61 - 1.24 mg/dL   Calcium 9.4 8.9 - 10.3 mg/dL   Phosphorus 4.2 2.5 - 4.6 mg/dL   Albumin 3.6 3.5 - 5.0 g/dL    GFR, Estimated 12 (L) >60 mL/min    Comment: (NOTE) Calculated using the CKD-EPI Creatinine Equation (2021)    Anion gap 16 (H) 5 - 15    Comment: Performed at [facility] [address], [place], [place] [address]  Glucose, capillary     Status: Abnormal   Collection Time: 08/11/21  1:10 AM  Result Value Ref Range   Glucose-Capillary 225 (H) 70 - 99 mg/dL    Comment: Glucose reference range applies only to samples taken after fasting for at least 8 hours.  Glucose, capillary     Status: Abnormal  Collection Time: 08/11/21  2:17 AM  Result Value Ref Range   Glucose-Capillary 227 (H) 70 - 99 mg/dL    Comment: Glucose reference range applies only to samples taken after fasting for at least 8 hours.  Glucose, capillary     Status: Abnormal   Collection Time: 08/11/21  3:06 AM  Result Value Ref Range   Glucose-Capillary 259 (H) 70 - 99 mg/dL    Comment: Glucose reference range applies only to samples taken after fasting for at least 8 hours.  Glucose, capillary     Status: Abnormal   Collection Time: 08/11/21  3:59 AM  Result Value Ref Range   Glucose-Capillary 262 (H) 70 - 99 mg/dL    Comment: Glucose reference range applies only to samples taken after fasting for at least 8 hours.  Glucose, capillary     Status: Abnormal   Collection Time: 08/11/21  5:09 AM  Result Value Ref Range   Glucose-Capillary 233 (H) 70 - 99 mg/dL    Comment: Glucose reference range applies only to samples taken after fasting for at least 8 hours.  Glucose, capillary     Status: Abnormal   Collection Time: 08/11/21  5:49 AM  Result Value Ref Range   Glucose-Capillary 244 (H) 70 - 99 mg/dL    Comment: Glucose reference range applies only to samples taken after fasting for at least 8 hours.  Glucose, capillary     Status: Abnormal   Collection Time: 08/11/21  6:49 AM  Result Value Ref Range   Glucose-Capillary 225 (H) 70 - 99 mg/dL    Comment: Glucose reference range applies only to samples taken  after fasting for at least 8 hours.  Glucose, capillary     Status: Abnormal   Collection Time: 08/11/21  8:25 AM  Result Value Ref Range   Glucose-Capillary 132 (H) 70 - 99 mg/dL    Comment: Glucose reference range applies only to samples taken after fasting for at least 8 hours.  Glucose, capillary     Status: Abnormal   Collection Time: 08/11/21  9:20 AM  Result Value Ref Range   Glucose-Capillary 123 (H) 70 - 99 mg/dL    Comment: Glucose reference range applies only to samples taken after fasting for at least 8 hours.  Glucose, capillary     Status: Abnormal   Collection Time: 08/11/21 10:37 AM  Result Value Ref Range   Glucose-Capillary 163 (H) 70 - 99 mg/dL    Comment: Glucose reference range applies only to samples taken after fasting for at least 8 hours.  Glucose, capillary     Status: Abnormal   Collection Time: 08/11/21 11:44 AM  Result Value Ref Range   Glucose-Capillary 157 (H) 70 - 99 mg/dL    Comment: Glucose reference range applies only to samples taken after fasting for at least 8 hours.  Glucose, capillary     Status: Abnormal   Collection Time: 08/11/21 12:54 PM  Result Value Ref Range   Glucose-Capillary 139 (H) 70 - 99 mg/dL    Comment: Glucose reference range applies only to samples taken after fasting for at least 8 hours.  Glucose, capillary     Status: Abnormal   Collection Time: 08/11/21  2:22 PM  Result Value Ref Range   Glucose-Capillary 151 (H) 70 - 99 mg/dL    Comment: Glucose reference range applies only to samples taken after fasting for at least 8 hours.  Glucose, capillary     Status: Abnormal   Collection Time:  08/11/21  3:27 PM  Result Value Ref Range   Glucose-Capillary 255 (H) 70 - 99 mg/dL    Comment: Glucose reference range applies only to samples taken after fasting for at least 8 hours.  Glucose, capillary     Status: Abnormal   Collection Time: 08/11/21  4:26 PM  Result Value Ref Range   Glucose-Capillary 206 (H) 70 - 99 mg/dL     Comment: Glucose reference range applies only to samples taken after fasting for at least 8 hours.  Glucose, capillary     Status: Abnormal   Collection Time: 08/11/21  5:55 PM  Result Value Ref Range   Glucose-Capillary 234 (H) 70 - 99 mg/dL    Comment: Glucose reference range applies only to samples taken after fasting for at least 8 hours.  Glucose, capillary     Status: Abnormal   Collection Time: 08/11/21  7:29 PM  Result Value Ref Range   Glucose-Capillary 298 (H) 70 - 99 mg/dL    Comment: Glucose reference range applies only to samples taken after fasting for at least 8 hours.  Glucose, capillary     Status: Abnormal   Collection Time: 08/11/21  9:02 PM  Result Value Ref Range   Glucose-Capillary 377 (H) 70 - 99 mg/dL    Comment: Glucose reference range applies only to samples taken after fasting for at least 8 hours.  Glucose, capillary     Status: Abnormal   Collection Time: 08/11/21 11:43 PM  Result Value Ref Range   Glucose-Capillary 501 (HH) 70 - 99 mg/dL    Comment: Glucose reference range applies only to samples taken after fasting for at least 8 hours.   Comment 1 Document in Chart   Glucose, capillary     Status: Abnormal   Collection Time: 08/12/21  3:37 AM  Result Value Ref Range   Glucose-Capillary 539 (HH) 70 - 99 mg/dL    Comment: Glucose reference range applies only to samples taken after fasting for at least 8 hours.   Comment 1 Call MD NNP PA CNM    Comment 2 Document in Chart   Basic metabolic panel     Status: Abnormal   Collection Time: 08/12/21  3:41 AM  Result Value Ref Range   Sodium 130 (L) 135 - 145 mmol/L   Potassium 4.5 3.5 - 5.1 mmol/L   Chloride 94 (L) 98 - 111 mmol/L   CO2 22 22 - 32 mmol/L   Glucose, Bld 588 (HH) 70 - 99 mg/dL    Comment: CRITICAL RESULT CALLED TO, READ BACK BY AND VERIFIED WITH: Kathryne Sharper, RN 08/12/21 0437 J. COLE, MLS    BUN 90 (H) 8 - 23 mg/dL   Creatinine, Ser 4.09 (H) 0.61 - 1.24 mg/dL   Calcium 8.8 (L) 8.9 - 10.3  mg/dL   GFR, Estimated 16 (L) >60 mL/min    Comment: (NOTE) Calculated using the CKD-EPI Creatinine Equation (2021)    Anion gap 14 5 - 15    Comment: Performed at [facility] [address], [place], [place] [address]  CBC     Status: Abnormal   Collection Time: 08/12/21  3:41 AM  Result Value Ref Range   WBC 10.5 4.0 - 10.5 K/uL   RBC 3.65 (L) 4.22 - 5.81 MIL/uL   Hemoglobin 10.2 (L) 13.0 - 17.0 g/dL   HCT 30.5 (L) 39.0 - 52.0 %   MCV 83.6 80.0 - 100.0 fL   MCH 27.9 26.0 - 34.0 pg  MCHC 33.4 30.0 - 36.0 g/dL   RDW 14.7 11.5 - 15.5 %   Platelets 124 (L) 150 - 400 K/uL   nRBC 0.0 0.0 - 0.2 %    Comment: Performed at [facility], [place] [address], [place], [place] [address]  Glucose, capillary     Status: Abnormal   Collection Time: 08/12/21  4:41 AM  Result Value Ref Range   Glucose-Capillary 527 (HH) 70 - 99 mg/dL    Comment: Glucose reference range applies only to samples taken after fasting for at least 8 hours.   Comment 1 Document in Chart   Glucose, capillary     Status: Abnormal   Collection Time: 08/12/21  5:13 AM  Result Value Ref Range   Glucose-Capillary 457 (H) 70 - 99 mg/dL    Comment: Glucose reference range applies only to samples taken after fasting for at least 8 hours.  Glucose, capillary     Status: Abnormal   Collection Time: 08/12/21  6:10 AM  Result Value Ref Range   Glucose-Capillary 437 (H) 70 - 99 mg/dL    Comment: Glucose reference range applies only to samples taken after fasting for at least 8 hours.  Glucose, capillary     Status: Abnormal   Collection Time: 08/12/21  6:45 AM  Result Value Ref Range   Glucose-Capillary 391 (H) 70 - 99 mg/dL    Comment: Glucose reference range applies only to samples taken after fasting for at least 8 hours.  Glucose, capillary     Status: Abnormal   Collection Time: 08/12/21  7:20 AM  Result Value Ref Range   Glucose-Capillary 355 (H) 70 - 99 mg/dL    Comment: Glucose reference range applies  only to samples taken after fasting for at least 8 hours.  Glucose, capillary     Status: Abnormal   Collection Time: 08/12/21  7:53 AM  Result Value Ref Range   Glucose-Capillary 322 (H) 70 - 99 mg/dL    Comment: Glucose reference range applies only to samples taken after fasting for at least 8 hours.  Glucose, capillary     Status: Abnormal   Collection Time: 08/12/21  8:54 AM  Result Value Ref Range   Glucose-Capillary 237 (H) 70 - 99 mg/dL    Comment: Glucose reference range applies only to samples taken after fasting for at least 8 hours.  Glucose, capillary     Status: Abnormal   Collection Time: 08/12/21 11:31 AM  Result Value Ref Range   Glucose-Capillary 179 (H) 70 - 99 mg/dL    Comment: Glucose reference range applies only to samples taken after fasting for at least 8 hours.  Glucose, capillary     Status: Abnormal   Collection Time: 08/12/21 12:55 PM  Result Value Ref Range   Glucose-Capillary 154 (H) 70 - 99 mg/dL    Comment: Glucose reference range applies only to samples taken after fasting for at least 8 hours.  Glucose, capillary     Status: Abnormal   Collection Time: 08/12/21  1:53 PM  Result Value Ref Range   Glucose-Capillary 170 (H) 70 - 99 mg/dL    Comment: Glucose reference range applies only to samples taken after fasting for at least 8 hours.  Glucose, capillary     Status: Abnormal   Collection Time: 08/12/21  3:19 PM  Result Value Ref Range   Glucose-Capillary 133 (H) 70 - 99 mg/dL    Comment: Glucose reference range applies only to samples taken after fasting  for at least 8 hours.  MR LUMBAR SPINE WO CONTRAST  Result Date: 08/09/2021 CLINICAL DATA:  Lumbar radiculopathy EXAM: MRI LUMBAR SPINE WITHOUT CONTRAST TECHNIQUE: Multiplanar, multisequence MR imaging of the lumbar spine was performed. No intravenous contrast was administered. COMPARISON:  None. FINDINGS: Segmentation:  Standard Alignment: There is straightening of the normal lumbar spine lordosis.  There is no antero or retrolisthesis. Vertebrae: Vertebral body heights are preserved. Marrow signal is somewhat heterogeneous throughout. There is a 1.0 cm focus of T1 hypointensity in the L2 vertebral body. There is mild marrow signal abnormality with endplate edema at 075-GRM with associated endplate irregularity. Conus medullaris and cauda equina: Conus extends to the mid L1 level. Conus and cauda equina appear normal. Paraspinal and other soft tissues: The paraspinal soft tissues are unremarkable. T2 hyperintense renal lesions are incompletely characterized but likely reflects cysts. Disc levels: There is disc desiccation and narrowing at L3-L4 through L5-S1, most advanced at L5-S1. There is multilevel facet arthropathy, most advanced at L2-L3 and L4-L5. There associated facet joint effusions at multiple levels, most prominent on the left at L2-L3. T12-L1: No significant spinal canal or neural foraminal stenosis. L1-L2: There is mild bilateral facet arthropathy without significant spinal canal or neural foraminal stenosis. L2-L3: There is a mild disc bulge, ligamentum flavum thickening, and bilateral facet arthropathy resulting in mild spinal canal stenosis with crowding of the left subarticular zone without evidence of nerve root impingement and mild bilateral neural foraminal stenosis. L3-L4: There is a diffuse disc bulge, ligamentum flavum thickening, degenerative endplate change, and bilateral facet arthropathy resulting in mild spinal canal stenosis with crowding of the left subarticular zone without evidence of nerve root impingement and mild to moderate bilateral neural foraminal stenosis. L4-L5: There is a prominent disc bulge, ligamentum flavum thickening, prominent dorsal epidural fat, and bilateral facet arthropathy resulting in severe spinal canal stenosis with compression of the cauda equina nerve roots and impingement of the descending left nerve roots in the subarticular zone and severe right and  moderate left neural foraminal stenosis with impingement of the exiting right L4 nerve root. L5-S1: This level was not imaged in the axial plane, degrading evaluation. There is a diffuse disc bulge, degenerative endplate change, and bilateral facet arthropathy resulting in mild spinal canal stenosis and severe bilateral neural foraminal stenosis. IMPRESSION: 1. Marrow signal abnormality at L5-S1 with associated endplate irregularity is most likely degenerative in nature; however, infection could have a similar appearance given elevated inflammatory markers. If discitis/osteomyelitis is of concern, recommend postcontrast imaging of the lumbar spine. 2. Degenerative changes at L4-L5 results in severe spinal canal stenosis with compression of the cauda equina nerve roots and impingement of the descending left nerve roots in the subarticular zone, and severe right and moderate left neural foraminal stenosis with impingement of the exiting right L4 nerve root. 3. Severe bilateral neural foraminal stenosis at L5-S1. 4. Crowding of the left subarticular zones at L2-L3 and L3-L4 without evidence of nerve root impingement. Varying degrees of spinal canal and neural foraminal stenosis at the remaining levels as detailed above. 5. Multilevel facet arthropathy with associated facet joint effusions, most prominent on the left at L2-L3. 6. 1.0 cm focus of T1 hypointensity in the L2 vertebral body is indeterminate. Correlation with prior imaging studies, if available, would be helpful to assess for stability. This can also be assessed at the time of postcontrast imaging of the lumbar spine. Electronically Signed   By: Valetta Mole M.D.   On: 08/09/2021 21:00  MR ANKLE RIGHT WO CONTRAST  Result Date: 08/05/2021 CLINICAL DATA:  Mild widening of the mortise and soft tissue swelling on ankle radiography of 08/03/2021. Right ankle pain. Prior left above the knee amputation. EXAM: MRI OF THE RIGHT ANKLE WITHOUT CONTRAST TECHNIQUE:  Multiplanar, multisequence MR imaging of the ankle was performed. No intravenous contrast was administered. COMPARISON:  Radiographs 08/03/2021 FINDINGS: TENDONS Peroneal: Unremarkable Posteromedial: Mild tibialis posterior tenosynovitis and distal tendinopathy, correlate clinically in assessing for tibialis posterior dysfunction. Mild flexor hallucis longus tenosynovitis just proximal to the knot of Henry. Anterior: Unremarkable Achilles: Unremarkable Plantar Fascia: Mild thickening of the medial band of the plantar fascia as on image 13 series 8, without substantial surrounding edema. Small plantar calcaneal spur. The thickening may reflect low grade plantar fasciitis. LIGAMENTS Lateral: Thickened but intact anterior inferior tibiofibular ligament. Attenuated anterior talofibular ligament, suspicious for chronic or remote tear, without local synovitis to suggest anterolateral impingement. Calcaneofibular ligament appears intact. Medial: Grossly intact deep tibiotalar portion of the deltoid ligament. Indistinctness of tissue planes along the tibionavicular and tibiospring components of the deltoid ligament. CARTILAGE Ankle Joint: There is slightly more fluid space laterally in the tibiotalar joint compared to medially for example on image 17 of series 6 although this amounts to less than 2 mm. 0.3 cm focus of subcortical marrow edema posteromedially along the talar dome on image 16 series 6. Subtalar Joints/Sinus Tarsi: Low-level edema laterally along the subtalar joint on image 15 of series 9 may reflect low-grade lateral hindfoot impingement. There is edema in the sinus tarsi such that sinus tarsi syndrome is difficult to exclude. Bones: Osteochondral lesions of the calcaneocuboid joint are noted dorsally for example on image 16 series 7, with notable dorsal spurring. There is dorsal midfoot spurring as well. Other: No supplemental non-categorized findings. IMPRESSION: 1. Attenuated anterior talofibular ligament,  likely from chronic or remote tear. This could partially facilitate some of the equivocal widening in the lateral tibiotalar joint space, although the calcaneofibular ligament and posterior talofibular ligament appear intact. 2. Subcortical marrow edema along the lateral subtalar joint on image 15 series 8 raises the possibility of mild lateral hindfoot impingement. 3. Edema in the sinus tarsi, cannot exclude sinus tarsi syndrome. 4. Substantial confluent osteochondral lesions of the dorsal calcaneocuboid joint attributed to arthropathy. 5. Ill definition of tibiospring and tibionavicular portions of the deltoid ligament, potentially chronically injured or sprained. The deep tibiotalar portion appears intact. 6. Mild tibialis posterior tenosynovitis and distal tendinopathy, correlate clinically in assessing for tibialis posterior dysfunction. 7. Mild flexor hallucis longus tenosynovitis. 8. Low-grade plantar fasciitis suspected along the medial band plantar fascia. Electronically Signed   By: Van Clines M.D.   On: 08/05/2021 17:11      No results found.  Review of Systems  Constitutional:  Positive for activity change.  HENT:  Positive for dental problem.   Eyes: Negative.   Respiratory: Negative.    Cardiovascular:  Positive for leg swelling.  Gastrointestinal:  Positive for abdominal pain.  Endocrine: Negative.   Musculoskeletal:  Positive for arthralgias, back pain, joint swelling and myalgias.  Skin: Negative.   Neurological:  Positive for weakness and numbness.  Psychiatric/Behavioral:         PTSD  Blood pressure (!) 148/73, pulse 86, temperature 98.8 F (37.1 C), temperature source Oral, resp. rate 13, height '5\' 5"'$  (1.651 m), weight 119.3 kg, SpO2 93 %. Physical Exam Constitutional:      General: He is in acute distress.     Appearance: He  is well-developed. He is obese. He is ill-appearing.  HENT:     Head: Normocephalic and atraumatic.  Eyes:     Extraocular Movements:  Extraocular movements intact.     Pupils: Pupils are equal, round, and reactive to light.  Cardiovascular:     Rate and Rhythm: Normal rate.  Pulmonary:     Breath sounds: Normal breath sounds.  Abdominal:     Palpations: Abdomen is soft.  Musculoskeletal:     Cervical back: Normal range of motion.     Right lower leg: Edema present.     Comments: Left aka  Neurological:     Mental Status: He is alert and oriented to person, place, and time.     Cranial Nerves: Cranial nerves are intact.     Sensory: Sensory deficit present.     Motor: Weakness present.     Deep Tendon Reflexes: Babinski sign absent on the right side. Babinski sign absent on the left side.     Comments: Decreased light touch right foot Proprioception intact in the upper and lower extremities Not clear if right hip flexors, quads, hams are intrinsically weak, or this is pain mediated. Reports pain in the knee, ankle, and hip with movement of the right lower extremity. No pain with movement of left thigh.  Coordination is intact in the upper extremities Gait not assessed     Assessment/Plan: Jeffrey [name] is a 60 y.o. male With lumbar stenosis. I disagree with the radiologist interpretation of the stenosis severity. The stenosis is not severe. Also, stenosis rarely if ever causes an acute radicular syndrome. It is unlikely this is causing this recent onset of pain. He is exquisitely  painful with passive knee movement and passive hip flexion. Radicular pain is anterograde and not retrograde as he describes his pain. I recommend an investigation of the right hip and knees.  I concur that the lumbar stenosis is not a critical entity at this time. His pain medication is more than adequate for lumbar stenosis, and I have no other pain regimen to offer. 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 However a simple phone call with the rehab physicians might offer a better repository of skill with  this issue. There is no indication for surgery at this time. Also, Jeffrey [name] did not receive a full MRI exam which he will be billed for. Thus the axial imaging needs to be completed as this is an inadequate study as stated in the radiology report. He can follow up with [place] in the office, thank you for your consultation request.   Ashok Pall 08/12/2021, 3:37 PM

## 2021-08-12 NOTE — Transfer of Care (Signed)
Immediate Anesthesia Transfer of Care Note  Patient: Jeffrey [name]  Procedure(s) Performed: INSERTION OF ARTERIOVENOUS (AV) GORE-TEX GRAFT RIGHT ARM (Right)  Patient Location: PACU  Anesthesia Type:General  Level of Consciousness: awake, alert  and oriented  Airway & Oxygen Therapy: Patient Spontanous Breathing  Post-op Assessment: Report given to RN and Post -op Vital signs reviewed and stable  Post vital signs: Reviewed and stable  Last Vitals:  Vitals Value Taken Time  BP 161/89 08/12/21 1129  Temp    Pulse 81 08/12/21 1130  Resp 12 08/12/21 1130  SpO2 92 % 08/12/21 1130  Vitals shown include unvalidated device data.  Last Pain:  Vitals:   08/12/21 0833  TempSrc: Oral  PainSc:       Patients Stated Pain Goal: 2 ([id number])  Complications: No notable events documented.

## 2021-08-12 NOTE — TOC Progression Note (Signed)
Transition of Care [facility]) - Progression Note    Patient Details  Name: Jeffrey [name] MRN: [id number] Date of Birth: 06/27/1961  Transition of Care [facility]) CM/SW [place], [place] Phone Number: 08/12/2021, 3:37 PM  Clinical Narrative:     Patient has SNF bed at [facility] when medically ready. CSW will start insurance authorization close to patient being medically ready. CSW will continue to follow and assist with dc planning needs.  Expected Discharge Plan: [place] Barriers to Discharge: Continued Medical Work up, Ship broker  Expected Discharge Plan and Services Expected Discharge Plan: [place]       Living arrangements for the past 2 months: [place] Determinants of Health (SDOH) Interventions    Readmission Risk Interventions Readmission Risk Prevention Plan 04/07/2021  Transportation Screening Complete  Medication Review Press photographer) Referral to Pharmacy  PCP or Specialist appointment within 3-5 days of discharge Complete  HRI or Home Care Consult Complete  SW Recovery Care/Counseling Consult Complete  Palliative Care Screening Not [place] Complete

## 2021-08-12 NOTE — Anesthesia Preprocedure Evaluation (Addendum)
Anesthesia Evaluation  Patient identified by MRN, date of birth, ID band Patient awake    Reviewed: Allergy & Precautions, NPO status   Airway Mallampati: III  TM Distance: >3 FB Neck ROM: Full    Dental  (+) Edentulous Upper, Poor Dentition Multiple chipped and decayed lower teeth:   Pulmonary COPD, former smoker,     + decreased breath sounds      Cardiovascular Exercise Tolerance: Poor hypertension, +CHF   Rhythm:Regular Rate:Normal  ECHO 03/2021: 1. Left ventricular ejection fraction, by estimation, is 55 to 60%. The  left ventricle has normal function. The left ventricle has no regional  wall motion abnormalities. There is mild concentric left ventricular  hypertrophy. Left ventricular diastolic  parameters are consistent with Grade II diastolic dysfunction  (pseudonormalization).  2. The aortic valve is calcified out or proportion or age but within  normal limits in the sub-group of ESRD. Aortic valve regurgitation is not  visualized. Aortic valve sclerosis/calcification is present, without any  evidence of aortic stenosis.  3. Right ventricular systolic function is normal. The right ventricular  size is normal.  4. Left atrial size was mildly dilated.  5. The mitral valve is abnormal. No evidence of mitral valve  regurgitation. The mean mitral valve gradient is 3.0 mmHg with average  heart rate of 82 bpm.  6. The inferior vena cava is normal in size with greater than 50%  respiratory variability, suggesting right atrial pressure of 3 mmHg.    Neuro/Psych PSYCHIATRIC DISORDERS Anxiety    GI/Hepatic negative GI ROS, Neg liver ROS,   Endo/Other  diabetes (on endotool), Poorly Controlled, Type 2, Insulin DependentMorbid obesity  Renal/GU Renal Insufficiency, CRF and ESRFRenal disease     Musculoskeletal  (+) Arthritis ,   Abdominal (+) + obese,   Peds negative pediatric ROS (+)  Hematology  (+) anemia  ,   Anesthesia Other Findings Left leg amputation  Reproductive/Obstetrics negative OB ROS                            Anesthesia Physical Anesthesia Plan  ASA: 4  Anesthesia Plan: General   Post-op Pain Management:    Induction: Intravenous  PONV Risk Score and Plan: 2 and Treatment may vary due to age or medical condition  Airway Management Planned: Oral ETT  Additional Equipment: None  Intra-op Plan:   Post-operative Plan: Extubation in OR  Informed Consent: I have reviewed the patients History and Physical, chart, labs and discussed the procedure including the risks, benefits and alternatives for the proposed anesthesia with the patient or authorized representative who has indicated his/her understanding and acceptance.     Dental advisory given  Plan Discussed with: CRNA  Anesthesia Plan Comments: (GETA. Glidescope available. Existing IV. BS poorly controlled. On Endotool for treatment. Norton Blizzard, MD  )       Anesthesia Quick Evaluation

## 2021-08-12 NOTE — Discharge Instructions (Signed)
   [facility]  Discharge Instructions  AV Fistula or Graft Surgery for Dialysis Access  Please refer to the following instructions for your post-procedure care. Your surgeon or physician assistant will discuss any changes with you.  Activity  You may drive the day following your surgery, if you are comfortable and no longer taking prescription pain medication. Resume full activity as the soreness in your incision resolves.  Bathing/Showering  You may shower after you go home. Keep your incision dry for 48 hours. Do not soak in a bathtub, hot tub, or swim until the incision heals completely. You may not shower if you have a hemodialysis catheter.  Incision Care  Clean your incision with mild soap and water after 48 hours. Pat the area dry with a clean towel. You do not need a bandage unless otherwise instructed. Do not apply any ointments or creams to your incision. You may have skin glue on your incision. Do not peel it off. It will come off on its own in about one week. Your arm may swell a bit after surgery. To reduce swelling use pillows to elevate your arm so it is above your heart. Your doctor will tell you if you need to lightly wrap your arm with an ACE bandage.  Diet  Resume your normal diet. There are not special food restrictions following this procedure. In order to heal from your surgery, it is CRITICAL to get adequate nutrition. Your body requires vitamins, minerals, and protein. Vegetables are the best source of vitamins and minerals. Vegetables also provide the perfect balance of protein. Processed food has little nutritional value, so try to avoid this.  Medications  Resume taking all of your medications. If your incision is causing pain, you may take over-the counter pain relievers such as acetaminophen (Tylenol). If you were prescribed a stronger pain medication, please be aware these medications can cause nausea and constipation. Prevent  nausea by taking the medication with a snack or meal. Avoid constipation by drinking plenty of fluids and eating foods with high amount of fiber, such as fruits, vegetables, and grains.  Do not take Tylenol if you are taking prescription pain medications.  Follow up Your surgeon may want to see you in the office following your access surgery. If so, this will be arranged at the time of your surgery.  Please call us immediately for any of the following conditions:  Increased pain, redness, drainage (pus) from your incision site Fever of 101 degrees or higher Severe or worsening pain at your incision site Hand pain or numbness.  Reduce your risk of vascular disease:  Stop smoking. If you would like help, call [facility] at 1-800-QUIT-NOW 917-422-6169) or [place] at [place] your cholesterol Maintain a desired weight Control your diabetes Keep your blood pressure down  Dialysis  It will take several weeks to several months for your new dialysis access to be ready for use. Your surgeon will determine when it is okay to use it. Your nephrologist will continue to direct your dialysis. You can continue to use your Permcath until your new access is ready for use.   08/12/2021 Jeffrey [name] [id number] [birth year]  Surgeon(s): Serafina Mitchell, MD  Procedure(s): INSERTION OF ARTERIOVENOUS (AV) GORE-TEX GRAFT RIGHT ARM  x Do not stick graft for 4 weeks    If you have any questions, please call the office at [phone].

## 2021-08-12 NOTE — Op Note (Signed)
Patient name: Jeffrey [name] MRN: [id number] DOB: 1961-04-16 Sex: male  08/12/2021 Pre-operative Diagnosis: ESRD Post-operative diagnosis:  Same Surgeon:  Annamarie Major Assistants:  Leontine Locket Procedure:   Right brachial artery to axillary vein dialysis graft (4 x 7 Gore-Tex) Anesthesia:  General Blood Loss:  minimal Specimens:  none  Findings: End-to-end venous anastomosis  Indications: This is a 60 year old gentleman with end-stage renal disease.  He has previously had access removed on the left arm for steal and a right upper arm loop graft performed at [facility].  The graft is no longer functional.  He had a venogram yesterday that showed a patent axillary vein.  He comes in today for new access.  Procedure:  The patient was identified in the holding area and taken to [place] 16  The patient was then placed supine on the table. general anesthesia was administered.  The patient was prepped and draped in the usual sterile fashion.  A time out was called and antibiotics were administered.  A PA was necessary to expedite procedure and assist with technical details.  The patient's previous longitudinal incision near the axilla was opened with a 10 blade.  Cautery was used divide subcutaneous tissue.  I identified the previous graft and traced this down to the axillary vein.  There was dense scar tissue in this area.  I was able to get up into the axilla and found to healthy-appearing vein which was measuring about 1 cm.  It was fully mobilized and encircled with a vessel loop.  Next, a longitudinal incision was made just proximal to the antecubital crease.  Through this incision I dissected out the brachial artery which was a 5 mm artery.  Next, I used Exparel in the arm and then created a tunnel with a curved Gore tunneler.  A 4 x 7 Gore-Tex graft was brought through the tunnel.  The patient was given 3000 units of heparin.  The brachial artery was then occluded and a #11 blade was used to make  an arteriotomy which was extended longitudinally with Potts scissors.  I intentionally made this arteriotomy small given his history of steal.  The 4 mm end of the graft was then beveled to fit the size the arteriotomy and a running anastomosis was created with 6-0 Prolene.  Prior to completion the appropriate flushing maneuvers were performed and the anastomosis was completed.  There was excellent pulsatile flow through the graft.  The graft was then occluded and flushed with heparin saline.  Attention was then turned towards the vein.  I ligated the vein distally with a 2-0 silk tie after placing a clamp proximally.  The Gore-Tex graft was cut to the appropriate length and a end-to-end anastomosis was performed with running 6-0 Prolene.  Prior to completion the appropriate flushing maneuvers were performed and the anastomosis was completed.  There was an excellent thrill within the graft, and he had brisk radial and ulnar Doppler signals.  On further inspection of the venous anastomosis, there appeared to be a nervelike structure that has been inadvertently divided.  It was difficult to tell whether this was scar tissue or nerve.  I searched for the distal end, however nothing was obvious to be consistent with the nerve.  The wounds were then copiously irrigated.  Both incisions were closed with 2 layers of Vicryl followed by Dermabond.  The patient was successfully extubated and taken to recovery in stable condition.  In the recovery room I examined the patient.  He had excellent grip strength in his hand and did not complain of any numbness.   Disposition: To PACU stable.   Theotis Burrow, M.D., [facility] [place] Office: [phone] Pager:  [phone]

## 2021-08-12 NOTE — Progress Notes (Signed)
Pt to OR.

## 2021-08-12 NOTE — Progress Notes (Signed)
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  Presents presents with complaints of worsening pain on his right ankle.  He missed his hemodialysis secondary to this.  Was also hypoglycemic on arrival. Currently plan is pain control, may require formal neurosurgery consultation if the pain is not improving.  Subjective:  Saw pt after placement of AV graft, noted to have some mild oozing, dressing reinforced. Mild headache, otherwise no new complaints.    Assessment and Plan:  Right ankle pain with ligamental injury MRI negative for any acute fracture but shows attenuated talofibular ligament likely remote tear, mild lateral hindfoot impingement, edema showing sinus Tarsi syndrome, tibialis tenosynovitis and distal tendinopathy as well as flexor hallucis longus tenosynovitis and plantar fasciitis. Discussed with orthopedics, Cam walker boot recommended. Pain controlled with Dilaudid, Robaxin, gabapentin. PT recommends SNF  Concern for right leg radiculopathy Compression of the cauda equina nerve roots MRI L-spine performed.  Shows evidence of multilevel degenerative disc disorder with impingement on L4 nerve roots as well as compression of the cauda equina nerve roots. Neurosurgery on-call was contacted on 9/12, recommended to start the patient on IV Decadron 4 mg every 6 hours for 2 days and monitor response. Pain did not improve with steroids Neurosurgery officially consulted, no further intervention, outpatient follow up There is concern for marrow signal abnormality at L5-S1, but discitis or osteomyelitis not suspected clinically.  Blood cultures are negative.   Hypo and hyperglycemia in the setting of type 2 diabetes mellitus. Uncontrolled with  hyper and hypoglycemia with renal complication. Long-term insulin use. Glucose significantly low on admission.  Treated with IV D10. Patient was placed on a lower home regimen dose with he remained hyperglycemic Patient has been non-compliant to diet, and CBGs have been mostly running high, was placed back on endotool and plan is to transition back to [place] insulin on 08/12/21 Completed decadron Continue U500 90U BID, SSI q4h Monitor closely   ESRD on HD MWF Vascular access issue Appreciate nephrology assistance Continue HD as per nephrology Vascular surgery consulted, s/p Right upper extremity AV graft placement on 08/12/21  HLD Continue statin  Mood disorder Continue home regimen  HTN Continue home regimen.  Morbid obesity OSA Continue CPAP.  Watch for side effects of pain medications. Body mass index is 43.77 kg/m.   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 Treated with vancomycin through 6/18.  Bilateral rotator cuff injury. Patient scheduled for right rotator cuff surgery in October   DVT Prophylaxis:   heparin injection 5,000 Units Start: 08/04/21 0600    Pt is Full code.  Communication: None at bedside   Physical Exam: General: NAD  Cardiovascular: S1, S2 present Respiratory: CTAB Abdomen: Soft, nontender, nondistended, bowel sounds present Musculoskeletal: No R pedal edema noted, L AKA Skin: Normal Psychiatry: Normal mood   Vitals:   08/12/21 1200 08/12/21 1215 08/12/21 1230 08/12/21 1312  BP: (!) 159/84 (!) 154/84  (!) 148/73  Pulse: 85 87  86  Resp: 11 13    Temp:    98.8 F (37.1 C)  TempSrc:    Oral  SpO2: 97% 94% 93%   Weight:      Height:  Disposition:  Status is: Inpatient  Remains inpatient appropriate because:Ongoing active pain requiring inpatient pain management  Dispo: The patient is from: Home              Anticipated d/c is to: SNF              Patient currently is not medically stable to d/c.    Difficult to place patient No   Alma Friendly, MD [facility] 08/12/2021 5:12 PM

## 2021-08-13 ENCOUNTER — Encounter (HOSPITAL_COMMUNITY): Payer: Self-pay | Admitting: Surgery

## 2021-08-13 ENCOUNTER — Inpatient Hospital Stay (HOSPITAL_COMMUNITY): Payer: Medicare Other

## 2021-08-13 DIAGNOSIS — N186 End stage renal disease: Secondary | ICD-10-CM | POA: Diagnosis not present

## 2021-08-13 DIAGNOSIS — E1159 Type 2 diabetes mellitus with other circulatory complications: Secondary | ICD-10-CM | POA: Diagnosis not present

## 2021-08-13 DIAGNOSIS — I152 Hypertension secondary to endocrine disorders: Secondary | ICD-10-CM | POA: Diagnosis not present

## 2021-08-13 DIAGNOSIS — Z992 Dependence on renal dialysis: Secondary | ICD-10-CM

## 2021-08-13 DIAGNOSIS — E162 Hypoglycemia, unspecified: Secondary | ICD-10-CM | POA: Diagnosis not present

## 2021-08-13 LAB — RENAL FUNCTION PANEL
Albumin: 3.2 g/dL — ABNORMAL LOW (ref 3.5–5.0)
Anion gap: 13 (ref 5–15)
BUN: 112 mg/dL — ABNORMAL HIGH (ref 8–23)
CO2: 25 mmol/L (ref 22–32)
Calcium: 8.2 mg/dL — ABNORMAL LOW (ref 8.9–10.3)
Chloride: 93 mmol/L — ABNORMAL LOW (ref 98–111)
Creatinine, Ser: 4 mg/dL — ABNORMAL HIGH (ref 0.61–1.24)
GFR, Estimated: 16 mL/min — ABNORMAL LOW (ref >60)
Glucose, Bld: 351 mg/dL — ABNORMAL HIGH (ref 70–99)
Phosphorus: 4.8 mg/dL — ABNORMAL HIGH (ref 2.5–4.6)
Potassium: 4.1 mmol/L (ref 3.5–5.1)
Sodium: 131 mmol/L — ABNORMAL LOW (ref 135–145)

## 2021-08-13 LAB — CBC
HCT: 32.3 % — ABNORMAL LOW (ref 39.0–52.0)
Hemoglobin: 10.5 g/dL — ABNORMAL LOW (ref 13.0–17.0)
MCH: 27.6 pg (ref 26.0–34.0)
MCHC: 32.5 g/dL (ref 30.0–36.0)
MCV: 85 fL (ref 80.0–100.0)
Platelets: 136 10*3/uL — ABNORMAL LOW (ref 150–400)
RBC: 3.8 MIL/uL — ABNORMAL LOW (ref 4.22–5.81)
RDW: 15.3 % (ref 11.5–15.5)
WBC: 10.7 10*3/uL — ABNORMAL HIGH (ref 4.0–10.5)
nRBC: 0 % (ref 0.0–0.2)

## 2021-08-13 LAB — GLUCOSE, CAPILLARY
Glucose-Capillary: 287 mg/dL — ABNORMAL HIGH (ref 70–99)
Glucose-Capillary: 203 mg/dL — ABNORMAL HIGH (ref 70–99)
Glucose-Capillary: 188 mg/dL — ABNORMAL HIGH (ref 70–99)
Glucose-Capillary: 268 mg/dL — ABNORMAL HIGH (ref 70–99)
Glucose-Capillary: 392 mg/dL — ABNORMAL HIGH (ref 70–99)
Glucose-Capillary: 324 mg/dL — ABNORMAL HIGH (ref 70–99)

## 2021-08-13 MED ORDER — HEPARIN SODIUM (PORCINE) 1000 UNIT/ML IJ SOLN
INTRAMUSCULAR | Status: AC
  Filled 2021-08-13: qty 1

## 2021-08-13 MED ORDER — BUTALBITAL-APAP-CAFFEINE 50-325-40 MG PO TABS
1.0000 | ORAL_TABLET | Freq: Four times a day (QID) | ORAL | Status: DC | PRN
  Administered 2021-08-13 – 2021-08-14 (×2): 1 via ORAL
  Filled 2021-08-13 (×2): qty 1

## 2021-08-13 MED ORDER — OXYCODONE HCL 5 MG PO TABS: 5.0000 mg | ORAL_TABLET | Freq: Four times a day (QID) | ORAL | 0 refills | Status: DC | PRN

## 2021-08-13 NOTE — Care Management Important Message (Signed)
Important Message  Patient Details  Name: Jeffrey [name] MRN: [id number] Date of Birth: May 11, 1961   Medicare Important Message Given:  Yes     [name] 08/13/2021, 3:25 PM

## 2021-08-13 NOTE — Progress Notes (Signed)
Inpatient Diabetes Program Recommendations  AACE/ADA: New Consensus Statement on Inpatient Glycemic Control (2015)  Target Ranges:  Prepandial:   less than 140 mg/dL      Peak postprandial:   less than 180 mg/dL (1-2 hours)      Critically ill patients:  140 - 180 mg/dL   Lab Results  Component Value Date   GLUCAP 268 (H) 08/13/2021   HGBA1C 9.5 (H) 08/04/2021    Review of Glycemic Control  Diabetes history:  DM 2 Outpatient Diabetes medications:  U500 insulin  Non-Dialysis days- U500-130 units in the mornings and 135 units in the evening Dialysis days- U500-75 units in the morning and 125 units in the evening  Orders: U-500 90 units BID, Novolog 0-15 Q4H.   Glucose 351 this am. Came off IV insulin drip on 9/15 and was given U-500 90 units at 1721. Received U-500 90 units at 0838 this am.  Inpatient Diabetes Program Recommendations:    Monitor blood sugars after discharge at least 4x/day. F/U with Endo for dose adjustment to home regimen.  Thank you. Lorenda Peck, RD, LDN, CDE Inpatient Diabetes Coordinator [phone]

## 2021-08-13 NOTE — Progress Notes (Signed)
PT Cancellation Note  Patient Details Name: Jeffrey [name] MRN: [id number] DOB: January 25, 1961   Cancelled Treatment:    Reason Eval/Treat Not Completed: Patient at procedure or test/unavailable. Pt currently at HD treatment. Will plan to follow-up later as time permits.   [name], PT, DPT Acute Rehabilitation Services  Pager: [phone] Office: [place] 08/13/2021, 10:54 AM

## 2021-08-13 NOTE — TOC Progression Note (Addendum)
Transition of Care [facility]) - Progression Note    Patient Details  Name: Jeffrey [name] MRN: [id number] Date of Birth: December 22, 1960  Transition of Care [facility]) CM/SW [place], [place] Phone Number: 08/13/2021, 11:25 AM  Clinical Narrative:     Update- Patients insurance is requesting updated PT notes. They need PT notes from the last 24 hours. CSW informed MD and PT. CSW will upload most recent PT note when available to patients insurance, for insurance authorization determination.  Clarene Critchley with [facility] confirmed with CSW that they cannot take patient over the weekend if medically ready. CSW started insurance authorization for patient. [place] ID# [id number]. Patient has SNF bed at [facility]. Insurance authorization pending. CSW informed MD. CSW will continue to follow and assist with dc planning needs.  Expected Discharge Plan: [place] Barriers to Discharge: Continued Medical Work up, Ship broker  Expected Discharge Plan and Services Expected Discharge Plan: [place]       Living arrangements for the past 2 months: [place] Determinants of Health (SDOH) Interventions    Readmission Risk Interventions Readmission Risk Prevention Plan 04/07/2021  Transportation Screening Complete  Medication Review Press photographer) Referral to Pharmacy  PCP or Specialist appointment within 3-5 days of discharge Complete  HRI or Home Care Consult Complete  SW Recovery Care/Counseling Consult Complete  Palliative Care Screening Not [place] Complete

## 2021-08-13 NOTE — Progress Notes (Signed)
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  Presents presents with complaints of worsening pain on his right ankle.  He missed his hemodialysis secondary to this.  Was also hypoglycemic on arrival. Currently plan is pain control, may require formal neurosurgery consultation if the pain is not improving.   Subjective:  Denies any new complaints today, still with R sided headache and some post op RUE tenderness.    Assessment and Plan:  Right ankle pain with ligamental injury MRI negative for any acute fracture but shows attenuated talofibular ligament likely remote tear, mild lateral hindfoot impingement, edema showing sinus Tarsi syndrome, tibialis tenosynovitis and distal tendinopathy as well as flexor hallucis longus tenosynovitis and plantar fasciitis. Discussed with orthopedics, Cam walker boot recommended. Pain controlled with Dilaudid, Robaxin, gabapentin PT recommends SNF  Concern for right leg radiculopathy Compression of the cauda equina nerve roots MRI L-spine performed.  Shows evidence of multilevel degenerative disc disorder with impingement on L4 nerve roots as well as compression of the cauda equina nerve roots. Neurosurgery on-call was contacted on 9/12, recommended to start the patient on IV Decadron 4 mg every 6 hours for 2 days and monitor response. Pain did not improve with steroids Neurosurgery officially consulted, no further intervention, outpatient follow up There is concern for marrow signal abnormality at L5-S1, but discitis or osteomyelitis not suspected clinically.  Blood cultures are negative.   Hypo and hyperglycemia in the setting of type 2 diabetes mellitus. Uncontrolled with hyper and hypoglycemia with renal  complication. Long-term insulin use. Glucose significantly low on admission.  Treated with IV D10. Patient was placed on a lower home regimen dose with he remained hyperglycemic Patient has been non-compliant to diet, and CBGs have been mostly running high, transitioned back to [place] insulin on 08/12/21 Completed decadron Continue U500 90U BID, SSI q4h Monitor closely   ESRD on HD MWF Vascular access placement Appreciate nephrology assistance Continue HD as per nephrology Vascular surgery consulted, s/p Right upper extremity AV graft placement on 08/12/21  HLD Continue statin  Mood disorder Continue home regimen  HTN Continue home regimen.  Morbid obesity OSA Continue CPAP.  Watch for side effects of pain medications. Body mass index is 44.9 kg/m.   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 Treated with vancomycin through 6/18.  Bilateral rotator cuff injury. Patient scheduled for right rotator cuff surgery in October   DVT Prophylaxis:   heparin injection 5,000 Units Start: 08/04/21 0600    Pt is Full code.  Communication: None at bedside   Physical Exam: General: NAD  Cardiovascular: S1, S2 present Respiratory: CTAB Abdomen: Soft, nontender, nondistended, bowel sounds present Musculoskeletal: No R pedal edema noted, L AKA Skin: Normal Psychiatry: Normal mood   Vitals:   08/13/21 1100 08/13/21 1130 08/13/21 1208 08/13/21 1240  BP: (!) 155/76 (!) 166/64 (!) 148/78 133/83  Pulse: 96 79 95 97  Resp: '14 18 20 20  '$ Temp:   98.6 F (37 C) 98.6 F (37 C)  TempSrc:   Oral Oral  SpO2:   97%   Weight:   122.4 kg   Height:        Disposition:  Status is: Inpatient  Remains inpatient  appropriate because:Ongoing active pain requiring inpatient pain management  Dispo: The patient is from: Home              Anticipated d/c is to: SNF              Patient currently is not medically stable to d/c.   Difficult to place patient No   Alma Friendly, MD [facility] 08/13/2021 4:53 PM

## 2021-08-13 NOTE — Progress Notes (Addendum)
[facility] Progress Note   Subjective:    Seen in room. He has a headache this am and soreness in his right arm s/p new RUE AVG. For dialysis today.   Objective Vitals:   08/12/21 1718 08/12/21 1944 08/13/21 0509 08/13/21 0817  BP: (!) 157/69 (!) 128/57 135/81 (!) 152/93  Pulse: 85 85 87 93  Resp:  '17 16 20  '$ Temp:  98.5 F (36.9 C) 98.3 F (36.8 C) 98.2 F (36.8 C)  TempSrc:  Oral Oral Oral  SpO2:  99% 100% 92%  Weight:      Height:       Physical Exam General: Well-appearing male; NAD Heart: S1 and S2; No murmurs, gallops, or rubs Lungs: CTA; breathing unlabored Abdomen: Large, soft, and non-tender Extremities: L AKA; No edema RLE Dialysis Access:  LUE TDC; new RUE AVG+bruit   Filed Weights   08/09/21 1354 08/09/21 1727  Weight: 121.6 kg 119.3 kg    Intake/Output Summary (Last 24 hours) at 08/13/2021 0903 Last data filed at 08/13/2021 0824 Gross per 24 hour  Intake 1126.58 ml  Output 3450 ml  Net -2323.42 ml     Additional Objective Labs: Basic Metabolic Panel: Recent Labs  Lab 08/06/21 1714 08/09/21 0648 08/11/21 0105 08/12/21 0341 08/12/21 1107  NA 132* 129* 129* 130* 132*  K 3.3* 3.7 4.1 4.5 7.3*  CL 91* 89* 90* 94* 102  CO2 '26 25 23 22  '$ --   GLUCOSE 230* 313* 203* 588* 209*  BUN 57* 126* 110* 90* >130*  CREATININE 2.56* 5.01* 5.27* 4.09* 3.90*  CALCIUM 9.0 8.8* 9.4 8.8*  --   PHOS 3.3 6.4* 4.2  --   --     Liver Function Tests: Recent Labs  Lab 08/06/21 1714 08/09/21 0648 08/11/21 0105  ALBUMIN 4.2 3.5 3.6    No results for input(s): LIPASE, AMYLASE in the last 168 hours. CBC: Recent Labs  Lab 08/06/21 1500 08/09/21 0648 08/12/21 0341 08/12/21 1107  WBC 12.2* 9.0 10.5  --   HGB 12.8* 10.5* 10.2* 11.2*  HCT 39.5 32.6* 30.5* 33.0*  MCV 85.9 83.8 83.6  --   PLT 182 134* 124*  --     Blood Culture    Component Value Date/Time   SDES BLOOD LEFT ARM 08/04/2021 1830   SPECREQUEST  08/04/2021 1830    BOTTLES  DRAWN AEROBIC AND ANAEROBIC Blood Culture results may not be optimal due to an inadequate volume of blood received in culture bottles   CULT  08/04/2021 1830    NO GROWTH 5 DAYS Performed at [facility] [address], [place], [place] [address]    REPTSTATUS 08/09/2021 FINAL 08/04/2021 1830    Cardiac Enzymes: No results for input(s): CKTOTAL, CKMB, CKMBINDEX, TROPONINI in the last 168 hours. CBG: Recent Labs  Lab 08/12/21 1837 08/12/21 1940 08/13/21 0134 08/13/21 0506 08/13/21 0808  GLUCAP 212* 200* 287* 203* 188*    Iron Studies: No results for input(s): IRON, TIBC, TRANSFERRIN, FERRITIN in the last 72 hours. Lab Results  Component Value Date   INR 1.2 03/28/2021   Studies/Results: No results found.  Medications:  dextrose 5% lactated ringers Stopped (08/12/21 1955)   insulin Stopped (08/12/21 2000)   lactated ringers Stopped (08/12/21 1319)    amLODipine  5 mg Oral QHS   aspirin EC  81 mg Oral Daily   atorvastatin  40 mg Oral Daily   calcium acetate  1,334 mg Oral TID with meals   carvedilol  12.5 mg Oral BID WC   Chlorhexidine Gluconate Cloth  6 each Topical Q0600   heparin  5,000 Units Subcutaneous Q8H   insulin aspart  0-15 Units Subcutaneous Q4H   insulin regular human CONCENTRATED  90 Units Subcutaneous BID WC   lidocaine  3 patch Transdermal Q24H   lidocaine  5 mL Other Once   methocarbamol  500 mg Oral TID   metolazone  10 mg Oral Daily   mupirocin ointment   Nasal BID   nortriptyline  20 mg Oral QHS   pantoprazole  20 mg Oral Daily   pregabalin  25 mg Oral BID   senna-docusate  1 tablet Oral BID   sertraline  100 mg Oral Daily   torsemide  100 mg Oral Q T,Th,S,Su    Dialysis Orders: [facility]-MWF 3h 37mn  450/ 1.5  124.5kg  2/2.5 bath  [facility] TDC/ (RUA AVG - not in use) - venofer '100mg'$  x 1 , rec'd only 1 dose on 07/30/21  Assessment/Plan:  ESRD [place]  [place]WF[place]  Hyperkalemia on I-stat labs yesterday,  doubt accuracy. Will repeat labs this am with HD.  HD today. Dialysis access: s/p new LUE AVG per Dr. BTrula Slade9/15  Uncontrolled DM2- Hypo +hyperglycemia.  Insulin per primary team.  BP/ volume - BP stable. On torsemide on non-HD days. Currently under EDW. Does not appear volume overloaded.  Euvolemic on exam, lower dry weight on d/c. Right Ankle Pain-MRI (-) acute fracture but shows attenuated ligament likely remote tear, mild lateral hindfoot impingement, Tarsi syndrome, tibalis tenosynovitis, and distal tendinopathy .  Continue current pain management and PT.  Possible R Leg Radiculopathy-MRI spine performed: shows multilevel degenerative disc disorder with impingement on L4 nerve and compression of the cauda equina nerve root. Also concern for marrow signal abnormality at L5-S1. Discitis/Osteomyelitis currectly not suspected. Neurosurgery consulted -no indication for surgery.  Cramping/Left leg pain - Cramping improving. Of note, he is taking large doses of loop diuretics and also metolazone, and is voiding large amounts daily. MRI showed ligamental injury. Lidocaine patch helping. Per primary.  Leukocytosis - afebrile.  Plain films of R foot were negative. WBC improving. Anemia ckd - Hgb 10.2.  Not on ESA-monitor trends.  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  Continue binder.  Nutrition - alb 4.2.  On carb modified diet.  Dispo: Anticipated d/c to SNF   [facility] 08/13/2021,9:06 AM   Seen and examined independently this morning.  Agree with note and exam as documented above by physician extender and as noted here.  Patient states that he does not want to talk to me today and tells me "you know what you did" to offend him.  He states that I opened his door at one point without knocking; I do not recall this and apologized.  He is also upset about the labs being ordered for the floor.  We discussed labs another team obtained yesterday demonstrated high  potassium; these labs were I stat and I question accuracy but that we wanted to confirm his potassium.  I discussed that another nephrologist is coming on service tomorrow  ESRD - on HD MWF schedule    RAVF malfunction s/p RUE AVG on 9/16   DM per primary team   Anemia CKD - Hb 10.5 at goal. Not on ESA.   LClaudia Desanctis MD 08/13/2021  11:07 AM

## 2021-08-13 NOTE — Progress Notes (Signed)
Treatment ended per patient request with 1.41 hours remaining. Goal not met. Patient refused to sign AMS form when ending treatment before prescribed time. MD Royce Macadamia aware.

## 2021-08-13 NOTE — Progress Notes (Addendum)
  Progress Note    08/13/2021 8:02 AM 1 Day Post-Op  Subjective:  soreness at surgery site R arm; denies signs of symptoms of steal in R hand   Vitals:   08/12/21 1944 08/13/21 0509  BP: (!) 128/57 135/81  Pulse: 85 87  Resp: 17 16  Temp: 98.5 F (36.9 C) 98.3 F (36.8 C)  SpO2: 99% 100%   Physical Exam: Lungs:  non labored Incisions:  R arm incisions look fine; some serosanguinous drainage from distal incision Extremities:  R hand with motor and sensation intact; warm and well perfused; palpable thrill in AVG  CBC    Component Value Date/Time   WBC 10.5 08/12/2021 0341   RBC 3.65 (L) 08/12/2021 0341   HGB 11.2 (L) 08/12/2021 1107   HCT 33.0 (L) 08/12/2021 1107   PLT 124 (L) 08/12/2021 0341   MCV 83.6 08/12/2021 0341   MCH 27.9 08/12/2021 0341   MCHC 33.4 08/12/2021 0341   RDW 14.7 08/12/2021 0341   LYMPHSABS 2.6 08/04/2021 0101   MONOABS 1.2 (H) 08/04/2021 0101   EOSABS 0.1 08/04/2021 0101   BASOSABS 0.1 08/04/2021 0101    BMET    Component Value Date/Time   NA 132 (L) 08/12/2021 1107   K 7.3 (HH) 08/12/2021 1107   CL 102 08/12/2021 1107   CO2 22 08/12/2021 0341   GLUCOSE 209 (H) 08/12/2021 1107   BUN >130 (H) 08/12/2021 1107   CREATININE 3.90 (H) 08/12/2021 1107   CALCIUM 8.8 (L) 08/12/2021 0341   GFRNONAA 16 (L) 08/12/2021 0341    INR    Component Value Date/Time   INR 1.2 03/28/2021 2149     Intake/Output Summary (Last 24 hours) at 08/13/2021 0802 Last data filed at 08/13/2021 [id number] Gross per 24 hour  Intake 1126.58 ml  Output 3150 ml  Net -2023.42 ml     Assessment/Plan:  60 y.o. male is s/p R arm AVG 1 Day Post-Op   R arm AVG patent without signs or symptoms of steal syndrome in R hand Can use graft in 4 weeks Ok for discharge home from vascular standpoint   Dagoberto Ligas, PA-C [facility] [phone] 08/13/2021 8:02 AM   I have independently interviewed and examined patient and agree with PA assessment and  plan above.  Graft with strong thrill, hand is neurovascularly intact and warm.  Okay for graft use in 4 weeks and can follow-up with vascular on an as-needed basis.  Loyda Costin C. Donzetta Matters, MD [facility] [place] Office: [phone] Pager: [phone]

## 2021-08-13 NOTE — Progress Notes (Signed)
CPAP (auto titrate max 18, min 5) at bedside within patients reach. Patient stated he would place himself on/off as needed.

## 2021-08-13 NOTE — Progress Notes (Signed)
Physical Therapy Treatment Patient Details Name: Jeffrey [name] MRN: [id number] DOB: Aug 19, 1961 Today's Date: 08/13/2021   History of Present Illness 60 yo male presents to [facility] on 9/6 with complaints of R-sided body spasms and L HD cath pain, missed HD on Monday, and hypoglycemia. Also with R ankle pain secondary to ligamental injury, MRI negative. S/p Right brachial artery to axillary vein dialysis graft (4 x 7 [employer]) 9/15. 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    PT Comments    Pt continues to be limited in mobility/activity by pain, primarily headache pain this date. He reported "spinning" sensation upon coming to sit along with worsening dizziness and headache pain when tracking finger to L inferior visual field, may benefit from a Vestibular Eval if it does not resolve with the headache. Applied gentle soft tissue mobilization and stretching to R upper trapezius muscle as it was tender to palpation and stretching reproduced the pain. RN aware of these findings and communicating with MD. Despite the pain, he was willing to participate and motivated to improve. He was able to laterally scoot to transfer to the L from bed > commode with min guard assist but needing modA to return to bed scooting to his R due to UE and lower extremity weakness impacting his ability to clear the arm rest of the commode. Will continue to follow acutely. Current recommendations remain appropriate.    Recommendations for follow up therapy are one component of a multi-disciplinary discharge planning process, led by the attending physician.  Recommendations may be updated based on patient status, additional functional criteria and insurance authorization.  Follow Up Recommendations  SNF     Equipment Recommendations  None recommended by PT    Recommendations for Other Services       Precautions / Restrictions Precautions Precautions: Fall Precaution Comments: L AKA Required Braces  or Orthoses: Other Brace (CAM boot on R) Restrictions Weight Bearing Restrictions: No     Mobility  Bed Mobility Overal bed mobility: Needs Assistance Bed Mobility: Supine to Sit     Supine to sit: Supervision;HOB elevated     General bed mobility comments: Extra time and use of bed rails and HOB elevated to come to sit L EOB with supervision.    Transfers Overall transfer level: Needs assistance Equipment used: None (UE support on bedside commode/bed) Transfers: Lateral/Scoot Transfers          Lateral/Scoot Transfers: Min guard;Mod assist General transfer comment: Lateral scooting bed <> bedside commode with min guard to go from elevated EOB towards L to commode but up to modA to complete transfer towards R from commode to low EOB, difficulty clearing arm rest of commode.  Ambulation/Gait             General Gait Details: nt   Marine scientist Rankin (Stroke Patients Only)       Balance Overall balance assessment: Needs assistance Sitting-balance support: Single extremity supported;No upper extremity supported;Feet supported Sitting balance-Leahy Scale: Fair         Standing balance comment: Deferred this session                            Cognition Arousal/Alertness: Awake/alert Behavior During Therapy: WFL for tasks assessed/performed Overall Cognitive Status: Within Functional Limits for tasks assessed  General Comments: PTSD, affecting pain control and ability to set limits of activity      Exercises Other Exercises Other Exercises: Provided gentle soft tissue mobilization to R upper trap muscle Other Exercises: Educated and had pt perform several stretches to r upper trap muscle    General Comments General comments (skin integrity, edema, etc.): Pt dizzy upon coming to sit, describing it as "spinning" and having increased dizziness when  tracking finger to L inferior visual field, notified RN      Pertinent Vitals/Pain Pain Assessment: Faces Faces Pain Scale: Hurts whole lot Pain Location: R-sided headache, R arm Pain Descriptors / Indicators: Grimacing;Guarding;Pressure;Headache Pain Intervention(s): Limited activity within patient's tolerance;Monitored during session;Repositioned;Premedicated before session;Other (comment) (notified RN)    Home Living                      Prior Function            PT Goals (current goals can now be found in the care plan section) Acute Rehab PT Goals Patient Stated Goal: to not hurt PT Goal Formulation: With patient Time For Goal Achievement: 08/21/21 Potential to Achieve Goals: Fair Progress towards PT goals: Progressing toward goals    Frequency    Min 2X/week      PT Plan Current plan remains appropriate    Co-evaluation              AM-PAC PT "6 Clicks" Mobility   Outcome Measure  Help needed turning from your back to your side while in a flat bed without using bedrails?: None Help needed moving from lying on your back to sitting on the side of a flat bed without using bedrails?: A Little Help needed moving to and from a bed to a chair (including a wheelchair)?: A Lot Help needed standing up from a chair using your arms (e.g., wheelchair or bedside chair)?: A Lot Help needed to walk in hospital room?: Total Help needed climbing 3-5 steps with a railing? : Total 6 Click Score: 13    End of Session Equipment Utilized During Treatment: Gait belt Activity Tolerance: Patient limited by pain Patient left: in bed;with call bell/phone within reach;with bed alarm set Nurse Communication: Mobility status;Other (comment) (BP, dizziness, headache) PT Visit Diagnosis: Other abnormalities of gait and mobility (R26.89);Pain;Unsteadiness on feet (R26.81) Pain - Right/Left: Right Pain - part of body: Shoulder;Hip;Leg;Knee;Ankle and joints of foot (head)      Time: 1620-1650 PT Time Calculation (min) (ACUTE ONLY): 30 min  Charges:  $Therapeutic Activity: 23-37 mins                     [name], PT, DPT Acute Rehabilitation Services  Pager: [phone] Office: [place] 08/13/2021, 4:58 PM

## 2021-08-14 DIAGNOSIS — E1159 Type 2 diabetes mellitus with other circulatory complications: Secondary | ICD-10-CM | POA: Diagnosis not present

## 2021-08-14 DIAGNOSIS — N186 End stage renal disease: Secondary | ICD-10-CM | POA: Diagnosis not present

## 2021-08-14 DIAGNOSIS — I152 Hypertension secondary to endocrine disorders: Secondary | ICD-10-CM | POA: Diagnosis not present

## 2021-08-14 DIAGNOSIS — E162 Hypoglycemia, unspecified: Secondary | ICD-10-CM | POA: Diagnosis not present

## 2021-08-14 LAB — GLUCOSE, CAPILLARY
Glucose-Capillary: 134 mg/dL — ABNORMAL HIGH (ref 70–99)
Glucose-Capillary: 275 mg/dL — ABNORMAL HIGH (ref 70–99)
Glucose-Capillary: 288 mg/dL — ABNORMAL HIGH (ref 70–99)
Glucose-Capillary: 321 mg/dL — ABNORMAL HIGH (ref 70–99)
Glucose-Capillary: 435 mg/dL — ABNORMAL HIGH (ref 70–99)
Glucose-Capillary: 92 mg/dL (ref 70–99)

## 2021-08-14 MED ORDER — BUTALBITAL-APAP-CAFFEINE 50-325-40 MG PO TABS
1.0000 | ORAL_TABLET | Freq: Four times a day (QID) | ORAL | Status: AC | PRN
  Administered 2021-08-14 – 2021-08-17 (×6): 1 via ORAL
  Filled 2021-08-14 (×6): qty 1

## 2021-08-14 MED ORDER — INSULIN ASPART 100 UNIT/ML IJ SOLN
20.0000 [IU] | Freq: Once | INTRAMUSCULAR | Status: AC
  Administered 2021-08-14: 20 [IU] via SUBCUTANEOUS

## 2021-08-14 NOTE — Progress Notes (Signed)
[facility] Progress Note   Subjective:    Completed dialysis yesterday net UF 1L  Seen in room. Still has headache. Denies cp, sob.   Objective Vitals:   08/13/21 1942 08/14/21 0003 08/14/21 0543 08/14/21 0730  BP: (!) 154/76 (!) 141/76 132/67 138/72  Pulse:  95 84   Resp:  20 16   Temp: 99.3 F (37.4 C) 98.8 F (37.1 C) 98.1 F (36.7 C) 98.7 F (37.1 C)  TempSrc: Oral Oral Oral Oral  SpO2:  100% 98%   Weight:      Height:       Physical Exam General: Well-appearing male; NAD Heart: S1 and S2; No murmurs, gallops, or rubs Lungs: CTA; breathing unlabored Abdomen: Large, soft, and non-tender Extremities: L AKA; No edema RLE Dialysis Access:  LUE TDC; new RUE AVG+bruit   Filed Weights   08/13/21 0951 08/13/21 1208  Weight: 123.4 kg 122.4 kg    Intake/Output Summary (Last 24 hours) at 08/14/2021 1123 Last data filed at 08/14/2021 0834 Gross per 24 hour  Intake 720 ml  Output 2677 ml  Net -1957 ml     Additional Objective Labs: Basic Metabolic Panel: Recent Labs  Lab 08/09/21 0648 08/11/21 0105 08/12/21 0341 08/12/21 1107 08/13/21 0840  NA 129* 129* 130* 132* 131*  K 3.7 4.1 4.5 7.3* 4.1  CL 89* 90* 94* 102 93*  CO2 '25 23 22  '$ --  25  GLUCOSE 313* 203* 588* 209* 351*  BUN 126* 110* 90* >130* 112*  CREATININE 5.01* 5.27* 4.09* 3.90* 4.00*  CALCIUM 8.8* 9.4 8.8*  --  8.2*  PHOS 6.4* 4.2  --   --  4.8*    Liver Function Tests: Recent Labs  Lab 08/09/21 0648 08/11/21 0105 08/13/21 0840  ALBUMIN 3.5 3.6 3.2*    No results for input(s): LIPASE, AMYLASE in the last 168 hours. CBC: Recent Labs  Lab 08/09/21 0648 08/12/21 0341 08/12/21 1107 08/13/21 0840  WBC 9.0 10.5  --  10.7*  HGB 10.5* 10.2* 11.2* 10.5*  HCT 32.6* 30.5* 33.0* 32.3*  MCV 83.8 83.6  --  85.0  PLT 134* 124*  --  136*    Blood Culture    Component Value Date/Time   SDES BLOOD LEFT ARM 08/04/2021 1830   SPECREQUEST  08/04/2021 1830    BOTTLES DRAWN AEROBIC  AND ANAEROBIC Blood Culture results may not be optimal due to an inadequate volume of blood received in culture bottles   CULT  08/04/2021 1830    NO GROWTH 5 DAYS Performed at [facility] [address], [place], [place] [address]    REPTSTATUS 08/09/2021 FINAL 08/04/2021 1830    Cardiac Enzymes: No results for input(s): CKTOTAL, CKMB, CKMBINDEX, TROPONINI in the last 168 hours. CBG: Recent Labs  Lab 08/13/21 1618 08/13/21 1956 08/14/21 0000 08/14/21 0502 08/14/21 0727  GLUCAP 392* 324* 275* 134* 92    Iron Studies: No results for input(s): IRON, TIBC, TRANSFERRIN, FERRITIN in the last 72 hours. Lab Results  Component Value Date   INR 1.2 03/28/2021   Studies/Results: CT HEAD WO CONTRAST (5MM)  Result Date: 08/13/2021 CLINICAL DATA:  Dizziness with nonspecific unilateral headache. EXAM: CT HEAD WITHOUT CONTRAST TECHNIQUE: Contiguous axial images were obtained from the base of the skull through the vertex without intravenous contrast. COMPARISON:  MRI brain 04/02/2021 FINDINGS: Brain: Mild diffuse cerebral atrophy. Mild ventricular dilatation likely central atrophy. Patchy low-attenuation change in the deep white matter suggesting small vessel ischemia. No mass-effect  or midline shift. No abnormal extra-axial fluid collections. Gray-white matter junctions are distinct. Basal cisterns are not effaced. No acute intracranial hemorrhage. Vascular: Mild intracranial vascular calcifications. Skull: Calvarium appears intact. Sinuses/Orbits: Retention cysts in the paranasal sinuses. No acute air-fluid levels. Mastoid air cells are clear. Other: None. IMPRESSION: No acute intracranial abnormalities. Mild chronic atrophy and small vessel ischemic changes. Electronically Signed   By: Lucienne Capers M.D.   On: 08/13/2021 19:41    Medications:  dextrose 5% lactated ringers Stopped (08/12/21 1955)   lactated ringers Stopped (08/12/21 1319)    amLODipine  5 mg Oral QHS   aspirin EC  81  mg Oral Daily   atorvastatin  40 mg Oral Daily   calcium acetate  1,334 mg Oral TID with meals   carvedilol  12.5 mg Oral BID WC   Chlorhexidine Gluconate Cloth  6 each Topical Q0600   heparin  5,000 Units Subcutaneous Q8H   insulin aspart  0-15 Units Subcutaneous Q4H   insulin regular human CONCENTRATED  90 Units Subcutaneous BID WC   lidocaine  3 patch Transdermal Q24H   lidocaine  5 mL Other Once   methocarbamol  500 mg Oral TID   metolazone  10 mg Oral Daily   mupirocin ointment   Nasal BID   nortriptyline  20 mg Oral QHS   pantoprazole  20 mg Oral Daily   pregabalin  25 mg Oral BID   senna-docusate  1 tablet Oral BID   sertraline  100 mg Oral Daily   torsemide  100 mg Oral Q T,Th,S,Su    Dialysis Orders: [facility]-MWF 3h 58mn  450/ 1.5  124.5kg  2/2.5 bath  [facility] TDC/ (RUA AVG - not in use) - venofer '100mg'$  x 1 , rec'd only 1 dose on 07/30/21  Assessment/Plan:  ESRD -  [place]WF[place].  Dialysis access: s/p new LUE AVG per Dr. BTrula Slade9/15  Uncontrolled DM2- Hypo +hyperglycemia.  Insulin per primary team.  BP/ volume - BP stable. On torsemide on non-HD days. Currently under EDW. Does not appear volume overloaded.  Euvolemic on exam, Right Ankle Pain-MRI (-) acute fracture but shows attenuated ligament likely remote tear, mild lateral hindfoot impingement, Tarsi syndrome, tibalis tenosynovitis, and distal tendinopathy .  Continue current pain management and PT.  Possible R Leg Radiculopathy-MRI spine performed: shows multilevel degenerative disc disorder with impingement on L4 nerve and compression of the cauda equina nerve root. Also concern for marrow signal abnormality at L5-S1. Discitis/Osteomyelitis currectly not suspected. Neurosurgery consulted -no indication for surgery.  Cramping/Left leg pain - Cramping improving. Of note, he is taking large doses of loop diuretics and also metolazone, and is voiding large amounts daily. MRI  showed ligamental injury. Lidocaine patch helping. Per primary.  Leukocytosis - afebrile.  Plain films of R foot were negative. WBC improving. Anemia ckd - Hgb 10.2.  Not on ESA-monitor trends.  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  Continue binder.  Nutrition - alb 4.2.  On carb modified diet.  Dispo: Anticipated d/c to SNF   OLynnda ChildPA-C [facility] 08/14/2021,11:23 AM

## 2021-08-14 NOTE — TOC Progression Note (Signed)
Transition of Care [facility]) - Progression Note    Patient Details  Name: Jeffrey [name] MRN: [id number] Date of Birth: Jul 14, 1961  Transition of Care [facility]) CM/SW [place], LCSW Phone Number: [phone] 08/14/2021, 4:20 PM  Clinical Narrative:     CSW uploaded additional clinicals to the [place] spoke to pt in regards to his CPAP machine and he stated that he does not really use it and if he needs it he will have one of his family to bring the machine to facility if needed. Pt appeared very agitated by the question and CSW informed pt that his response would be noted.  TOC team will continue to assist with discharge planning needs.    Expected Discharge Plan: [place] Barriers to Discharge: Continued Medical Work up, Ship broker  Expected Discharge Plan and Services Expected Discharge Plan: [place]       Living arrangements for the past 2 months: [place] Determinants of Health (SDOH) Interventions    Readmission Risk Interventions Readmission Risk Prevention Plan 04/07/2021  Transportation Screening Complete  Medication Review Press photographer) Referral to Pharmacy  PCP or Specialist appointment within 3-5 days of discharge Complete  HRI or Home Care Consult Complete  SW Recovery Care/Counseling Consult Complete  Palliative Care Screening Not [place] Complete

## 2021-08-14 NOTE — Progress Notes (Signed)
RT note: Patients states they will place himself on when ready.

## 2021-08-14 NOTE — Progress Notes (Signed)
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  Presents presents with complaints of worsening pain on his right ankle.  He missed his hemodialysis secondary to this.  Was also hypoglycemic on arrival. Currently plan is pain control, may require formal neurosurgery consultation if the pain is not improving.   Subjective:  Still c/o headache this am, tenderness around graft site.    Assessment and Plan:  Right ankle pain with ligamental injury MRI negative for any acute fracture but shows attenuated talofibular ligament likely remote tear, mild lateral hindfoot impingement, edema showing sinus Tarsi syndrome, tibialis tenosynovitis and distal tendinopathy as well as flexor hallucis longus tenosynovitis and plantar fasciitis. Discussed with orthopedics, Cam walker boot recommended. Pain controlled with Dilaudid, Robaxin, gabapentin PT recommends SNF  Concern for right leg radiculopathy Compression of the cauda equina nerve roots MRI L-spine performed.  Shows evidence of multilevel degenerative disc disorder with impingement on L4 nerve roots as well as compression of the cauda equina nerve roots. Neurosurgery on-call was contacted on 9/12, recommended to start the patient on IV Decadron 4 mg every 6 hours for 2 days and monitor response. Pain did not improve with steroids Neurosurgery officially consulted, no further intervention, outpatient follow up There is concern for marrow signal abnormality at L5-S1, but discitis or osteomyelitis not suspected clinically.  Blood cultures are negative.   Hypo and hyperglycemia in the setting of type 2 diabetes mellitus. Uncontrolled with hyper and hypoglycemia with renal complication. Long-term insulin  use. Glucose significantly low on admission.  Treated with IV D10. Patient was placed on a lower home regimen dose with he remained hyperglycemic Patient has been non-compliant to diet, and CBGs have been mostly running high, transitioned back to [place] insulin on 08/12/21 Completed decadron Continue U500 90U BID, SSI q4h Monitor closely   ESRD on HD MWF Vascular access placement Appreciate nephrology assistance Continue HD as per nephrology Vascular surgery consulted, s/p Right upper extremity AV graft placement on 08/12/21  HLD Continue statin  Mood disorder Continue home regimen  HTN Continue home regimen.  Morbid obesity OSA Continue CPAP.  Watch for side effects of pain medications. Body mass index is 44.9 kg/m.   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 Treated with vancomycin through 6/18.  Bilateral rotator cuff injury. Patient scheduled for right rotator cuff surgery in October   DVT Prophylaxis:   heparin injection 5,000 Units Start: 08/04/21 0600    Pt is Full code.  Communication: None at bedside   Physical Exam: General: NAD  Cardiovascular: S1, S2 present Respiratory: CTAB Abdomen: Soft, nontender, nondistended, bowel sounds present Musculoskeletal: No R pedal edema noted, L AKA Skin: Normal Psychiatry: Normal mood   Vitals:   08/13/21 1942 08/14/21 0003 08/14/21 0543 08/14/21 0730  BP: (!) 154/76 (!) 141/76 132/67 138/72  Pulse:  95 84   Resp:  20 16   Temp: 99.3 F (37.4 C) 98.8 F (37.1 C) 98.1 F (36.7 C) 98.7 F (37.1 C)  TempSrc: Oral Oral Oral Oral  SpO2:  100% 98%   Weight:      Height:        Disposition:  Status is: Inpatient  Remains inpatient appropriate because:Ongoing active  pain requiring inpatient pain management  Dispo: The patient is from: Home              Anticipated d/c is to: SNF              Patient currently is not medically stable to d/c.   Difficult to place patient No   Alma Friendly, MD [facility] 08/14/2021 2:29 PM

## 2021-08-15 DIAGNOSIS — N186 End stage renal disease: Secondary | ICD-10-CM | POA: Diagnosis not present

## 2021-08-15 DIAGNOSIS — I152 Hypertension secondary to endocrine disorders: Secondary | ICD-10-CM | POA: Diagnosis not present

## 2021-08-15 DIAGNOSIS — E162 Hypoglycemia, unspecified: Secondary | ICD-10-CM | POA: Diagnosis not present

## 2021-08-15 DIAGNOSIS — E1159 Type 2 diabetes mellitus with other circulatory complications: Secondary | ICD-10-CM | POA: Diagnosis not present

## 2021-08-15 LAB — GLUCOSE, CAPILLARY
Glucose-Capillary: 104 mg/dL — ABNORMAL HIGH (ref 70–99)
Glucose-Capillary: 113 mg/dL — ABNORMAL HIGH (ref 70–99)
Glucose-Capillary: 158 mg/dL — ABNORMAL HIGH (ref 70–99)
Glucose-Capillary: 245 mg/dL — ABNORMAL HIGH (ref 70–99)
Glucose-Capillary: 258 mg/dL — ABNORMAL HIGH (ref 70–99)
Glucose-Capillary: 263 mg/dL — ABNORMAL HIGH (ref 70–99)
Glucose-Capillary: 99 mg/dL (ref 70–99)

## 2021-08-15 MED ORDER — SUMATRIPTAN SUCCINATE 25 MG PO TABS
25.0000 mg | ORAL_TABLET | Freq: Once | ORAL | Status: AC
  Administered 2021-08-15: 25 mg via ORAL
  Filled 2021-08-15: qty 1

## 2021-08-15 MED ORDER — POLYETHYLENE GLYCOL 3350 17 G PO PACK
17.0000 g | PACK | Freq: Every day | ORAL | Status: DC
  Administered 2021-08-15: 17 g via ORAL
  Filled 2021-08-15 (×2): qty 1

## 2021-08-15 NOTE — Progress Notes (Signed)
[facility] Progress Note   Subjective:    Seen in room. No new complaints this am. No cp, sob   Objective Vitals:   08/14/21 0730 08/14/21 1640 08/15/21 0021 08/15/21 0440  BP: 138/72 (!) 147/81 (!) 140/54 (!) 152/61  Pulse:  88 83   Resp:   18   Temp: 98.7 F (37.1 C) 98.7 F (37.1 C) 98.7 F (37.1 C)   TempSrc: Oral Oral Oral   SpO2:   98%   Weight:      Height:       Physical Exam General: Large man, well-appearing, nad  Heart: S1 and S2; No murmurs, gallops, or rubs Lungs: CTA; breathing unlabored Abdomen: Large, soft, and non-tender Extremities: L AKA; No edema RLE Dialysis Access:  LUE TDC; new RUE AVG+bruit   Filed Weights   08/13/21 0951 08/13/21 1208  Weight: 123.4 kg 122.4 kg    Intake/Output Summary (Last 24 hours) at 08/15/2021 0827 Last data filed at 08/14/2021 2030 Gross per 24 hour  Intake 476 ml  Output 1450 ml  Net -974 ml     Additional Objective Labs: Basic Metabolic Panel: Recent Labs  Lab 08/09/21 0648 08/11/21 0105 08/12/21 0341 08/12/21 1107 08/13/21 0840  NA 129* 129* 130* 132* 131*  K 3.7 4.1 4.5 7.3* 4.1  CL 89* 90* 94* 102 93*  CO2 '25 23 22  '$ --  25  GLUCOSE 313* 203* 588* 209* 351*  BUN 126* 110* 90* >130* 112*  CREATININE 5.01* 5.27* 4.09* 3.90* 4.00*  CALCIUM 8.8* 9.4 8.8*  --  8.2*  PHOS 6.4* 4.2  --   --  4.8*    Liver Function Tests: Recent Labs  Lab 08/09/21 0648 08/11/21 0105 08/13/21 0840  ALBUMIN 3.5 3.6 3.2*    No results for input(s): LIPASE, AMYLASE in the last 168 hours. CBC: Recent Labs  Lab 08/09/21 0648 08/12/21 0341 08/12/21 1107 08/13/21 0840  WBC 9.0 10.5  --  10.7*  HGB 10.5* 10.2* 11.2* 10.5*  HCT 32.6* 30.5* 33.0* 32.3*  MCV 83.8 83.6  --  85.0  PLT 134* 124*  --  136*    Blood Culture    Component Value Date/Time   SDES BLOOD LEFT ARM 08/04/2021 1830   SPECREQUEST  08/04/2021 1830    BOTTLES DRAWN AEROBIC AND ANAEROBIC Blood Culture results may not be optimal  due to an inadequate volume of blood received in culture bottles   CULT  08/04/2021 1830    NO GROWTH 5 DAYS Performed at [facility] [address] [place], [place] [address]    REPTSTATUS 08/09/2021 FINAL 08/04/2021 1830    Cardiac Enzymes: No results for input(s): CKTOTAL, CKMB, CKMBINDEX, TROPONINI in the last 168 hours. CBG: Recent Labs  Lab 08/14/21 1638 08/14/21 2035 08/15/21 0019 08/15/21 0436 08/15/21 0756  GLUCAP 435* 321* 258* 99 104*    Iron Studies: No results for input(s): IRON, TIBC, TRANSFERRIN, FERRITIN in the last 72 hours. Lab Results  Component Value Date   INR 1.2 03/28/2021   Studies/Results: CT HEAD WO CONTRAST (5MM)  Result Date: 08/13/2021 CLINICAL DATA:  Dizziness with nonspecific unilateral headache. EXAM: CT HEAD WITHOUT CONTRAST TECHNIQUE: Contiguous axial images were obtained from the base of the skull through the vertex without intravenous contrast. COMPARISON:  MRI brain 04/02/2021 FINDINGS: Brain: Mild diffuse cerebral atrophy. Mild ventricular dilatation likely central atrophy. Patchy low-attenuation change in the deep white matter suggesting small vessel ischemia. No mass-effect or midline shift. No abnormal  extra-axial fluid collections. Gray-white matter junctions are distinct. Basal cisterns are not effaced. No acute intracranial hemorrhage. Vascular: Mild intracranial vascular calcifications. Skull: Calvarium appears intact. Sinuses/Orbits: Retention cysts in the paranasal sinuses. No acute air-fluid levels. Mastoid air cells are clear. Other: None. IMPRESSION: No acute intracranial abnormalities. Mild chronic atrophy and small vessel ischemic changes. Electronically Signed   By: Lucienne Capers M.D.   On: 08/13/2021 19:41    Medications:  dextrose 5% lactated ringers Stopped (08/12/21 1955)   lactated ringers Stopped (08/12/21 1319)    amLODipine  5 mg Oral QHS   aspirin EC  81 mg Oral Daily   atorvastatin  40 mg Oral Daily    calcium acetate  1,334 mg Oral TID with meals   carvedilol  12.5 mg Oral BID WC   Chlorhexidine Gluconate Cloth  6 each Topical Q0600   heparin  5,000 Units Subcutaneous Q8H   insulin aspart  0-15 Units Subcutaneous Q4H   insulin regular human CONCENTRATED  90 Units Subcutaneous BID WC   lidocaine  3 patch Transdermal Q24H   lidocaine  5 mL Other Once   methocarbamol  500 mg Oral TID   metolazone  10 mg Oral Daily   mupirocin ointment   Nasal BID   nortriptyline  20 mg Oral QHS   pantoprazole  20 mg Oral Daily   pregabalin  25 mg Oral BID   senna-docusate  1 tablet Oral BID   sertraline  100 mg Oral Daily   torsemide  100 mg Oral Q T,Th,S,Su    Dialysis Orders: [facility]-MWF 3h 32mn  450/ 1.5  124.5kg  2/2.5 bath  [facility] TDC/ (RUA AVG - not in use) - venofer '100mg'$  x 1 , rec'd only 1 dose on 07/30/21  Assessment/Plan:  [place]  [place]WF[place]  Dialysis access: s/p new RUE AVG per Dr. BTrula Slade9/15  Uncontrolled DM2- Hypo +hyperglycemia.  Insulin per primary team.  BP/ volume - BP stable. Torsemide on non HD days. Currently under EDW. Does not appear volume overloaded.  Euvolemic on exam, Right Ankle Pain-MRI (-) acute fracture but shows attenuated ligament likely remote tear, mild lateral hindfoot impingement, Tarsi syndrome, tibalis tenosynovitis, and distal tendinopathy .  Continue current pain management and PT.  Possible R Leg Radiculopathy-MRI spine performed: shows multilevel degenerative disc disorder with impingement on L4 nerve and compression of the cauda equina nerve root. Also concern for marrow signal abnormality at L5-S1. Discitis/Osteomyelitis currectly not suspected. Neurosurgery consulted -no indication for surgery.  Cramping/Left stump pain - Improving, mostly on dialysis days.  Of note, he is taking large doses of loop diuretics and also metolazone. He is voiding large amounts daily. MRI showed ligamental injury. Lidocaine  patch helping. Per primary.  Anemia ckd - Hgb 10.5  Not on ESA-monitor trends.  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  Continue binder.  Nutrition - alb 4.2.  On carb modified diet.  Dispo: Anticipated d/c to SNF   [facility] 08/15/2021,8:27 AM

## 2021-08-15 NOTE — Progress Notes (Signed)
Patient notified nurse of tingling in right thumb and slight purple discoloration.  Right thumb slightly cooler than left thumb. +2 radial pulse.  Denies pain in thumb.  Dr. Horris Latino paged.

## 2021-08-15 NOTE — Progress Notes (Signed)
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  Presents presents with complaints of worsening pain on his right ankle.  He missed his hemodialysis secondary to this.  Was also hypoglycemic on arrival. Currently plan is pain control, may require formal neurosurgery consultation if the pain is not improving.   Subjective:  Still c/o R sided headache, with intermittent dizziness.    Assessment and Plan:  Right ankle pain with ligamental injury MRI negative for any acute fracture but shows attenuated talofibular ligament likely remote tear, mild lateral hindfoot impingement, edema showing sinus Tarsi syndrome, tibialis tenosynovitis and distal tendinopathy as well as flexor hallucis longus tenosynovitis and plantar fasciitis. Discussed with orthopedics, Cam walker boot recommended. Pain controlled with Dilaudid, Robaxin, gabapentin PT recommends SNF  Concern for right leg radiculopathy Compression of the cauda equina nerve roots MRI L-spine performed.  Shows evidence of multilevel degenerative disc disorder with impingement on L4 nerve roots as well as compression of the cauda equina nerve roots. Neurosurgery on-call was contacted on 9/12, recommended to start the patient on IV Decadron 4 mg every 6 hours for 2 days and monitor response. Pain did not improve with steroids Neurosurgery officially consulted, no further intervention, outpatient follow up There is concern for marrow signal abnormality at L5-S1, but discitis or osteomyelitis not suspected clinically.  Blood cultures are negative.   Hypo and hyperglycemia in the setting of type 2 diabetes mellitus. Uncontrolled with hyper and hypoglycemia with renal complication. Long-term insulin  use. Glucose significantly low on admission.  Treated with IV D10. Patient was placed on a lower home regimen dose with he remained hyperglycemic Patient has been non-compliant to diet, and CBGs have been mostly running high, transitioned back to [place] insulin on 08/12/21 Completed decadron Continue U500 90U BID, SSI q4h Monitor closely   ESRD on HD MWF Vascular access placement Appreciate nephrology assistance Continue HD as per nephrology Vascular surgery consulted, s/p Right upper extremity AV graft placement on 08/12/21  HLD Continue statin  Mood disorder Continue home regimen  HTN Continue home regimen.  Morbid obesity OSA Continue CPAP.  Watch for side effects of pain medications. Body mass index is 44.9 kg/m.   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 Treated with vancomycin through 6/18.  Bilateral rotator cuff injury. Patient scheduled for right rotator cuff surgery in October  Headache ?migraine R sided CT head negative Fioricet prn, may try a dose of sumatriptan    DVT Prophylaxis:   heparin injection 5,000 Units Start: 08/04/21 0600    Pt is Full code.  Communication: None at bedside   Physical Exam: General: NAD  Cardiovascular: S1, S2 present Respiratory: CTAB Abdomen: Soft, nontender, nondistended, bowel sounds present Musculoskeletal: No R pedal edema noted, L AKA Skin: Normal Psychiatry: Normal mood   Vitals:   08/15/21 0021 08/15/21 0440 08/15/21 0846 08/15/21 1334  BP: (!) 140/54 (!) 152/61 (!) 147/76 (!) 158/75  Pulse: 83  83 90  Resp: '18  18 18  '$ Temp: 98.7 F (37.1 C)   98.6 F (37 C)  TempSrc: Oral   Axillary  SpO2: 98%  98% 98%  Weight:      Height:  Disposition:  Status is: Inpatient  Remains inpatient appropriate because:Ongoing active pain requiring inpatient pain management  Dispo: The patient is from: Home              Anticipated d/c is to: SNF              Patient currently is not medically  stable to d/c.   Difficult to place patient No   Alma Friendly, MD [facility] 08/15/2021 1:52 PM

## 2021-08-15 NOTE — Progress Notes (Signed)
No call back, patient states no change. Aileen Fass RN updated on situation.   Paged on call [facility] to call night shift RN.

## 2021-08-15 NOTE — Progress Notes (Signed)
Dr. Horris Latino called back, asked that vascular be paged. Paged on call number for [facility],.

## 2021-08-16 DIAGNOSIS — N186 End stage renal disease: Secondary | ICD-10-CM | POA: Diagnosis not present

## 2021-08-16 DIAGNOSIS — E162 Hypoglycemia, unspecified: Secondary | ICD-10-CM | POA: Diagnosis not present

## 2021-08-16 DIAGNOSIS — I152 Hypertension secondary to endocrine disorders: Secondary | ICD-10-CM | POA: Diagnosis not present

## 2021-08-16 DIAGNOSIS — E1159 Type 2 diabetes mellitus with other circulatory complications: Secondary | ICD-10-CM | POA: Diagnosis not present

## 2021-08-16 LAB — GLUCOSE, CAPILLARY
Glucose-Capillary: 163 mg/dL — ABNORMAL HIGH (ref 70–99)
Glucose-Capillary: 104 mg/dL — ABNORMAL HIGH (ref 70–99)
Glucose-Capillary: 253 mg/dL — ABNORMAL HIGH (ref 70–99)
Glucose-Capillary: 234 mg/dL — ABNORMAL HIGH (ref 70–99)
Glucose-Capillary: 277 mg/dL — ABNORMAL HIGH (ref 70–99)

## 2021-08-16 LAB — CBC WITH DIFFERENTIAL/PLATELET
Abs Immature Granulocytes: 0.14 10*3/uL — ABNORMAL HIGH (ref 0.00–0.07)
Basophils Absolute: 0 10*3/uL (ref 0.0–0.1)
Basophils Relative: 0 %
Eosinophils Absolute: 0.3 10*3/uL (ref 0.0–0.5)
Eosinophils Relative: 3 %
HCT: 29.3 % — ABNORMAL LOW (ref 39.0–52.0)
Hemoglobin: 9.7 g/dL — ABNORMAL LOW (ref 13.0–17.0)
Immature Granulocytes: 2 %
Lymphocytes Relative: 9 %
Lymphs Abs: 0.9 10*3/uL (ref 0.7–4.0)
MCH: 28.4 pg (ref 26.0–34.0)
MCHC: 33.1 g/dL (ref 30.0–36.0)
MCV: 85.7 fL (ref 80.0–100.0)
Monocytes Absolute: 0.5 10*3/uL (ref 0.1–1.0)
Monocytes Relative: 5 %
Neutro Abs: 7.5 10*3/uL (ref 1.7–7.7)
Neutrophils Relative %: 81 %
Platelets: 157 10*3/uL (ref 150–400)
RBC: 3.42 MIL/uL — ABNORMAL LOW (ref 4.22–5.81)
RDW: 15 % (ref 11.5–15.5)
WBC: 9.4 10*3/uL (ref 4.0–10.5)
nRBC: 0 % (ref 0.0–0.2)

## 2021-08-16 LAB — RENAL FUNCTION PANEL
Albumin: 3.1 g/dL — ABNORMAL LOW (ref 3.5–5.0)
Anion gap: 15 (ref 5–15)
BUN: 118 mg/dL — ABNORMAL HIGH (ref 8–23)
CO2: 25 mmol/L (ref 22–32)
Calcium: 8.6 mg/dL — ABNORMAL LOW (ref 8.9–10.3)
Chloride: 89 mmol/L — ABNORMAL LOW (ref 98–111)
Creatinine, Ser: 4.19 mg/dL — ABNORMAL HIGH (ref 0.61–1.24)
GFR, Estimated: 15 mL/min — ABNORMAL LOW (ref >60)
Glucose, Bld: 310 mg/dL — ABNORMAL HIGH (ref 70–99)
Phosphorus: 4.8 mg/dL — ABNORMAL HIGH (ref 2.5–4.6)
Potassium: 4 mmol/L (ref 3.5–5.1)
Sodium: 129 mmol/L — ABNORMAL LOW (ref 135–145)

## 2021-08-16 LAB — RESP PANEL BY RT-PCR (FLU A&B, COVID) ARPGX2
Influenza A by PCR: NEGATIVE
Influenza B by PCR: NEGATIVE
SARS Coronavirus 2 by RT PCR: NEGATIVE

## 2021-08-16 MED ORDER — SUMATRIPTAN SUCCINATE 25 MG PO TABS
25.0000 mg | ORAL_TABLET | Freq: Three times a day (TID) | ORAL | Status: DC | PRN
  Administered 2021-08-17: 25 mg via ORAL
  Filled 2021-08-16 (×3): qty 1

## 2021-08-16 MED ORDER — LIDOCAINE-PRILOCAINE 2.5-2.5 % EX CREA: 1.0000 "application " | TOPICAL_CREAM | CUTANEOUS | Status: DC | PRN

## 2021-08-16 MED ORDER — SODIUM CHLORIDE 0.9 % IV SOLN: 100.0000 mL | INTRAVENOUS | Status: DC | PRN

## 2021-08-16 MED ORDER — LIDOCAINE HCL (PF) 1 % IJ SOLN: 5.0000 mL | INTRAMUSCULAR | Status: DC | PRN

## 2021-08-16 MED ORDER — ALTEPLASE 2 MG IJ SOLR
2.0000 mg | Freq: Once | INTRAMUSCULAR | Status: AC | PRN
  Administered 2021-08-16: 2 mg

## 2021-08-16 MED ORDER — HEPARIN SODIUM (PORCINE) 1000 UNIT/ML DIALYSIS: 20.0000 [IU]/kg | INTRAMUSCULAR | Status: DC | PRN

## 2021-08-16 MED ORDER — ALTEPLASE 2 MG IJ SOLR
2.0000 mg | Freq: Once | INTRAMUSCULAR | Status: DC
  Filled 2021-08-16: qty 2

## 2021-08-16 MED ORDER — HEPARIN SODIUM (PORCINE) 1000 UNIT/ML DIALYSIS: 1000.0000 [IU] | INTRAMUSCULAR | Status: DC | PRN

## 2021-08-16 MED ORDER — PENTAFLUOROPROP-TETRAFLUOROETH EX AERO: 1.0000 "application " | INHALATION_SPRAY | CUTANEOUS | Status: DC | PRN

## 2021-08-16 NOTE — Progress Notes (Signed)
Pt refused cpap for the night.  

## 2021-08-16 NOTE — Progress Notes (Signed)
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  Presents presents with complaints of worsening pain on his right ankle.  He missed his hemodialysis secondary to this.  Was also hypoglycemic on arrival. Currently plan is pain control, may require formal neurosurgery consultation if the pain is not improving.   Subjective:  Pt reluctant to be discharged, has multiple complaints including ??headache, dizziness, abdominal pain etc. Pt has a hx of OSA unable to tolerate CPAP, noted poor sleep.    Assessment and Plan:  Right ankle pain with ligamental injury MRI negative for any acute fracture but shows attenuated talofibular ligament likely remote tear, mild lateral hindfoot impingement, edema showing sinus Tarsi syndrome, tibialis tenosynovitis and distal tendinopathy as well as flexor hallucis longus tenosynovitis and plantar fasciitis. Discussed with orthopedics, Cam walker boot recommended. Pain controlled with Dilaudid, Robaxin, gabapentin PT recommends SNF  Concern for right leg radiculopathy Compression of the cauda equina nerve roots MRI L-spine performed.  Shows evidence of multilevel degenerative disc disorder with impingement on L4 nerve roots as well as compression of the cauda equina nerve roots. Neurosurgery on-call was contacted on 9/12, recommended to start the patient on IV Decadron 4 mg every 6 hours for 2 days and monitor response. Pain did not improve with steroids Neurosurgery officially consulted, no further intervention, outpatient follow up There is concern for marrow signal abnormality at L5-S1, but discitis or osteomyelitis not suspected clinically.  Blood cultures are negative.   Hypo and hyperglycemia in the setting of type 2  diabetes mellitus. Uncontrolled with hyper and hypoglycemia with renal complication. Long-term insulin use. Glucose significantly low on admission.  Treated with IV D10. Patient was placed on a lower home regimen dose with he remained hyperglycemic Patient has been non-compliant to diet, and CBGs have been mostly running high, transitioned back to [place] insulin on 08/12/21 Completed decadron Continue U500 90U BID, SSI q4h Monitor closely   ESRD on HD MWF Vascular access placement Appreciate nephrology assistance Continue HD as per nephrology Vascular surgery consulted, s/p Right upper extremity AV graft placement on 08/12/21  HLD Continue statin  Mood disorder Continue home regimen  HTN Continue home regimen.  Morbid obesity OSA Continue CPAP.  Watch for side effects of pain medications. Body mass index is 44.9 kg/m.   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 Treated with vancomycin through 6/18.  Bilateral rotator cuff injury. Patient scheduled for right rotator cuff surgery in October  Headache ?migraine R sided CT head negative Fioricet prn not helping, will try sumatriptan prn    DVT Prophylaxis:   heparin injection 5,000 Units Start: 08/04/21 0600    Pt is Full code.  Communication: None at bedside   Physical Exam: General: NAD  Cardiovascular: S1, S2 present Respiratory: CTAB Abdomen: Soft, nontender, nondistended, bowel sounds present Musculoskeletal: No R pedal edema noted, L AKA Skin: Normal Psychiatry: Normal mood   Vitals:   08/15/21 1627 08/15/21 2100 08/16/21 0008 08/16/21 0413  BP: (!) 149/72 (!) 152/66 (!) 144/64 (!) 140/52  Pulse: 87 84 88 80  Resp: '18 17 18 18  '$ Temp:  98.4 F (36.9 C) 98.8 F (37.1 C) 98.6 F (37 C)  TempSrc:  Oral  Oral Oral  SpO2:  97% 99% 97%  Weight:      Height:        Disposition:  Status is: Inpatient  Remains inpatient appropriate because:Ongoing active pain requiring inpatient pain  management  Dispo: The patient is from: Home              Anticipated d/c is to: SNF              Patient currently is medically stable to d/c.   Difficult to place patient No   Alma Friendly, MD [facility] 08/16/2021 12:27 PM

## 2021-08-16 NOTE — TOC Progression Note (Signed)
Transition of Care [facility]) - Progression Note    Patient Details  Name: Jeffrey [name] MRN: [id number] Date of Birth: 12-05-1960  Transition of Care [facility]) CM/SW [place], [place] Phone Number: 08/16/2021, 10:18 AM  Clinical Narrative:      CSW called patients insurance to check on status since it was still pending in the Nav[employer] Portal. Patients insurance confirmed that it has been approved. Plan Auth ID# [id number] and [facility] ID# [id number]. Patient insurance has been approved from 9/19-9/21. Next review date is 9/21. CSW called [facility] and spoke with Clarene Critchley who confirmed they can accept patient today if medically ready. CSW informed MD. CSW will continue to follow and assist with dc planning needs.  Expected Discharge Plan: [place] Barriers to Discharge: Continued Medical Work up, Ship broker  Expected Discharge Plan and Services Expected Discharge Plan: [place]       Living arrangements for the past 2 months: [place] Determinants of Health (SDOH) Interventions    Readmission Risk Interventions Readmission Risk Prevention Plan 04/07/2021  Transportation Screening Complete  Medication Review Press photographer) Referral to Pharmacy  PCP or Specialist appointment within 3-5 days of discharge Complete  HRI or Home Care Consult Complete  SW Recovery Care/Counseling Consult Complete  Palliative Care Screening Not [place] Complete

## 2021-08-16 NOTE — Progress Notes (Signed)
Pt denies any further tingling to R thumb. R thumb warm, good cap refill, +CMS. Will continue to monitor closely. Jessie Foot, RN

## 2021-08-16 NOTE — Progress Notes (Addendum)
[facility] Progress Note   Subjective:   Patient seen and examined at bedside.  Reports cramping over weekend and abdominal pain this AM after breakfast.  Denies CP, SOB, n/v/d, weakness and fatigue.  No change in foot pain.   Objective Vitals:   08/15/21 1627 08/15/21 2100 08/16/21 0008 08/16/21 0413  BP: (!) 149/72 (!) 152/66 (!) 144/64 (!) 140/52  Pulse: 87 84 88 80  Resp: '18 17 18 18  '$ Temp:  98.4 F (36.9 C) 98.8 F (37.1 C) 98.6 F (37 C)  TempSrc:  Oral Oral Oral  SpO2:  97% 99% 97%  Weight:      Height:       Physical Exam General:well appearing, large, alert male in NAD Heart:RRR, no mrg appreciated  Lungs:CTAB, nml WOB on 2L via [place] Abdomen:large, round, soft, NT Extremities:L AKA, no edema RLE Dialysis Access: [facility], RU AVG +b/t   Filed Weights   08/13/21 0951 08/13/21 1208  Weight: 123.4 kg 122.4 kg    Intake/Output Summary (Last 24 hours) at 08/16/2021 1126 Last data filed at 08/16/2021 0415 Gross per 24 hour  Intake 720 ml  Output 1851 ml  Net -1131 ml    Additional Objective Labs: Basic Metabolic Panel: Recent Labs  Lab 08/11/21 0105 08/12/21 0341 08/12/21 1107 08/13/21 0840  NA 129* 130* 132* 131*  K 4.1 4.5 7.3* 4.1  CL 90* 94* 102 93*  CO2 23 22  --  25  GLUCOSE 203* 588* 209* 351*  BUN 110* 90* >130* 112*  CREATININE 5.27* 4.09* 3.90* 4.00*  CALCIUM 9.4 8.8*  --  8.2*  PHOS 4.2  --   --  4.8*   Liver Function Tests: Recent Labs  Lab 08/11/21 0105 08/13/21 0840  ALBUMIN 3.6 3.2*   CBC: Recent Labs  Lab 08/12/21 0341 08/12/21 1107 08/13/21 0840  WBC 10.5  --  10.7*  HGB 10.2* 11.2* 10.5*  HCT 30.5* 33.0* 32.3*  MCV 83.6  --  85.0  PLT 124*  --  136*   CBG: Recent Labs  Lab 08/15/21 1613 08/15/21 2036 08/15/21 2357 08/16/21 0410 08/16/21 0739  GLUCAP 113* 245* 263* 163* 104*   Medications:   amLODipine  5 mg Oral QHS   aspirin EC  81 mg Oral Daily   atorvastatin  40 mg Oral Daily   calcium  acetate  1,334 mg Oral TID with meals   carvedilol  12.5 mg Oral BID WC   Chlorhexidine Gluconate Cloth  6 each Topical Q0600   heparin  5,000 Units Subcutaneous Q8H   insulin aspart  0-15 Units Subcutaneous Q4H   insulin regular human CONCENTRATED  90 Units Subcutaneous BID WC   lidocaine  3 patch Transdermal Q24H   lidocaine  5 mL Other Once   methocarbamol  500 mg Oral TID   metolazone  10 mg Oral Daily   nortriptyline  20 mg Oral QHS   pantoprazole  20 mg Oral Daily   polyethylene glycol  17 g Oral Daily   pregabalin  25 mg Oral BID   senna-docusate  1 tablet Oral BID   sertraline  100 mg Oral Daily   torsemide  100 mg Oral Q T,Th,S,Su    Dialysis Orders: [facility]-MWF 3h 72mn  450/ 1.5  124.5kg  2/2.5 bath  [facility] TDC/ (RUA AVG - not in use) - venofer '100mg'$  x 1 , rec'd only 1 dose on 07/30/21   Assessment/Plan:   ESRD -  HD MWF. Was on HD  in [place], now living w/ his dtr in [place]. Next HD 9/19.  Dialysis access: s/p new RUE AVG per Dr. Trula Slade 9/15  Uncontrolled DM2- Hypo +hyperglycemia.  Insulin per primary team.  BP/ volume - BP stable. Torsemide and metolazone on non HD days. Currently under EDW. Does not appear volume overloaded.  Euvolemic on exam, will need lower dry on d/c.  Right Ankle Pain-MRI (-) acute fracture but shows attenuated ligament likely remote tear, mild lateral hindfoot impingement, Tarsi syndrome, tibalis tenosynovitis, and distal tendinopathy .  Continue current pain management and PT per PMD.  Possible R Leg Radiculopathy-MRI spine performed: shows multilevel degenerative disc disorder with impingement on L4 nerve and compression of the cauda equina nerve root. Also concern for marrow signal abnormality at L5-S1. Discitis/Osteomyelitis currectly not suspected. Neurosurgery consulted -no indication for surgery.  Cramping/LE pain- On both HD and non HD days. Of note, he is taking large doses of loop diuretics and also metolazone which could be contributing. Dosing  changed to non HD days with little improvement.  He is voiding large amounts daily. MRI showed ligamental injury. Lidocaine patch helping. Per primary.  Anemia ckd - Hgb 10.7.  Not on ESA-monitor trends.  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  Continue binder. Not on VDRA. Nutrition - alb 4.2.  On carb modified diet.  Dispo: Anticipated d/c to SNF  Jen Mow, PA-C [place] 08/16/2021,11:26 AM  LOS: 11 days

## 2021-08-17 DIAGNOSIS — E162 Hypoglycemia, unspecified: Secondary | ICD-10-CM | POA: Diagnosis not present

## 2021-08-17 DIAGNOSIS — I152 Hypertension secondary to endocrine disorders: Secondary | ICD-10-CM | POA: Diagnosis not present

## 2021-08-17 DIAGNOSIS — E1159 Type 2 diabetes mellitus with other circulatory complications: Secondary | ICD-10-CM | POA: Diagnosis not present

## 2021-08-17 DIAGNOSIS — N186 End stage renal disease: Secondary | ICD-10-CM | POA: Diagnosis not present

## 2021-08-17 LAB — GLUCOSE, CAPILLARY
Glucose-Capillary: 145 mg/dL — ABNORMAL HIGH (ref 70–99)
Glucose-Capillary: 218 mg/dL — ABNORMAL HIGH (ref 70–99)
Glucose-Capillary: 300 mg/dL — ABNORMAL HIGH (ref 70–99)
Glucose-Capillary: 332 mg/dL — ABNORMAL HIGH (ref 70–99)
Glucose-Capillary: 333 mg/dL — ABNORMAL HIGH (ref 70–99)
Glucose-Capillary: 347 mg/dL — ABNORMAL HIGH (ref 70–99)

## 2021-08-17 MED ORDER — TORSEMIDE 100 MG PO TABS: 100.0000 mg | ORAL_TABLET | ORAL | Status: DC

## 2021-08-17 MED ORDER — HYDROMORPHONE HCL 2 MG PO TABS: 2.0000 mg | ORAL_TABLET | Freq: Four times a day (QID) | ORAL | 0 refills | Status: DC | PRN

## 2021-08-17 MED ORDER — DIAZEPAM 5 MG PO TABS: 5.0000 mg | ORAL_TABLET | Freq: Two times a day (BID) | ORAL | 0 refills | Status: DC | PRN

## 2021-08-17 MED ORDER — BUTALBITAL-APAP-CAFFEINE 50-325-40 MG PO TABS: 1.0000 | ORAL_TABLET | Freq: Four times a day (QID) | ORAL | 0 refills | Status: DC | PRN

## 2021-08-17 MED ORDER — PREGABALIN 25 MG PO CAPS: 25.0000 mg | ORAL_CAPSULE | Freq: Two times a day (BID) | ORAL | Status: DC

## 2021-08-17 MED ORDER — LIDOCAINE 5 % EX PTCH: 3.0000 | MEDICATED_PATCH | CUTANEOUS | 0 refills | Status: DC

## 2021-08-17 MED ORDER — BUTALBITAL-APAP-CAFFEINE 50-325-40 MG PO TABS: 1.0000 | ORAL_TABLET | Freq: Four times a day (QID) | ORAL | 0 refills | Status: AC | PRN

## 2021-08-17 MED ORDER — HUMULIN R U-500 KWIKPEN 500 UNIT/ML ~~LOC~~ SOPN: 95.0000 [IU] | PEN_INJECTOR | Freq: Two times a day (BID) | SUBCUTANEOUS | Status: DC

## 2021-08-17 NOTE — TOC Transition Note (Addendum)
Transition of Care [facility]) - CM/SW Discharge Note   Patient Details  Name: Jeffrey [name] MRN: [id number] Date of Birth: [birth year]  Transition of Care [facility]) CM/SW Contact:  Trula Ore, [place] Phone Number: 08/17/2021, 1:15 PM   Clinical Narrative:     Patient will DC to: [facility]   Anticipated DC date: 08/17/2021  Family notified: Engineer, agricultural by: Corey Harold  ?  Per MD patient ready for DC to [facility]. RN, patient, patient's family, renal navigator, and facility notified of DC.Patient confirmed Cpap will be brought to SNF from home. Discharge Summary sent to facility. RN given number for report tele# [phone] RM# [phone]. DC packet on chart. Ambulance transport requested for patient.  CSW signing off.   Final next level of care: Skilled Nursing Facility Barriers to Discharge: No Barriers Identified   Patient Goals and CMS Choice Patient states their goals for this hospitalization and ongoing recovery are:: to go to SNF CMS Medicare.gov Compare Post Acute Care list provided to:: Patient Choice offered to / list presented to : Patient  Discharge Placement              Patient chooses bed at:  ([facility]) Patient to be transferred to facility by: [facility]   Patient and family notified of of transfer: 08/17/21  Discharge Plan and Services                                     Social Determinants of Health (SDOH) Interventions     Readmission Risk Interventions Readmission Risk Prevention Plan 04/07/2021  Transportation Screening Complete  Medication Review Press photographer) Referral to Pharmacy  PCP or Specialist appointment within 3-5 days of discharge Complete  HRI or Home Care Consult Complete  SW Recovery Care/Counseling Consult Complete  Palliative Care Screening Not [place] Complete

## 2021-08-17 NOTE — Progress Notes (Signed)
Inpatient Diabetes Program Recommendations  AACE/ADA: New Consensus Statement on Inpatient Glycemic Control (2015)  Target Ranges:  Prepandial:   less than 140 mg/dL      Peak postprandial:   less than 180 mg/dL (1-2 hours)      Critically ill patients:  140 - 180 mg/dL   Lab Results  Component Value Date   GLUCAP 218 (H) 08/17/2021   HGBA1C 9.5 (H) 08/04/2021    Review of Glycemic Control Results for KERSHAW, SPHAR (MRN [id number]) as of 08/17/2021 10:12  Ref. Range 08/16/2021 07:39 08/16/2021 11:26 08/16/2021 17:47 08/16/2021 20:22 08/17/2021 00:21 08/17/2021 04:28 08/17/2021 07:34  Glucose-Capillary Latest Ref Range: 70 - 99 mg/dL 104 (H) 253 (H) 234 (H) 277 (H) 300 (H) 145 (H) 218 (H)   Diabetes history:  DM 2 Outpatient Diabetes medications:  U500 insulin  Non-Dialysis days- U500-130 units in the mornings and 135 units in the evening Dialysis days- U500-75 units in the morning and 125 units in the evening  Orders: U-500 90 units BID, Novolog 0-15 Q4H.   Inpatient Diabetes Program Recommendations:    - Increase U-500 insulin 95 units bid  Thanks,  Tama Headings RN, MSN, BC-ADM Inpatient Diabetes Coordinator Team Pager [phone] (8a-5p)

## 2021-08-17 NOTE — Progress Notes (Signed)
Physical Therapy Treatment Patient Details Name: Jeffrey [name] MRN: [id number] DOB: 13-Nov-1961 Today's Date: 08/17/2021   History of Present Illness 60 yo male presents to [facility] on 9/6 with complaints of R-sided body spasms and L HD cath pain, missed HD on Monday, and hypoglycemia. Also with R ankle pain secondary to ligamental injury, MRI negative. S/p Right brachial artery to axillary vein dialysis graft (4 x 7 [employer]) 9/15. 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    PT Comments    Pt reluctantly agreeable to collection of orthostatic blood pressure after speaking with PA. Pt supine in darkened room on entry. Pt requests not to be touched throughout session. Pt currently supervision for bed mobility and min guard for sit to stand. Pt requires 1x steadying in standing prior to pt sitting back down due to fatigue, unable to collect BP in standing. D/c plans remain appropriate at this time. PT will continue to follow acutely.  Orthostatic BPs  Supine 147/48 92  Sitting 148/51 88  Sitting after 3 min 168/55 88  Standing (unable to maintain long enough for BP measurement) NA   Return to sitting  176/83 95       Recommendations for follow up therapy are one component of a multi-disciplinary discharge planning process, led by the attending physician.  Recommendations may be updated based on patient status, additional functional criteria and insurance authorization.  Follow Up Recommendations  SNF     Equipment Recommendations  None recommended by PT       Precautions / Restrictions Precautions Precautions: Fall Precaution Comments: L AKA Required Braces or Orthoses: Other Brace (CAM boot on R) Restrictions Weight Bearing Restrictions: No     Mobility  Bed Mobility Overal bed mobility: Needs Assistance Bed Mobility: Supine to Sit     Supine to sit: Supervision;HOB elevated     General bed mobility comments: Extra time and use of bed rails and HOB  elevated to come to sit L EOB with supervision.    Transfers Overall transfer level: Needs assistance Equipment used: None (UE support on bedside commode/bed) Transfers: Sit to/from Stand Sit to Stand: Min guard         General transfer comment: min guard for safety with power up and steadying with UE support on BSC beside bed, unable to maintain standing long enough to obtain BP  Ambulation/Gait             General Gait Details: nt          Balance Overall balance assessment: Needs assistance Sitting-balance support: Single extremity supported;No upper extremity supported;Feet supported Sitting balance-Leahy Scale: Fair Sitting balance - Comments: can sit EOB without PT assist   Standing balance support: Bilateral upper extremity supported;During functional activity Standing balance-Leahy Scale: Poor                              Cognition Arousal/Alertness: Awake/alert Behavior During Therapy: Restless (irritated with taking orthostatic vitals) Overall Cognitive Status: Within Functional Limits for tasks assessed                                 General Comments: PTSD, affecting pain control and ability to set limits of activity         General Comments General comments (skin integrity, edema, etc.): Pt with dizziness and headache in supine, increases with positional change,  requesting pain medication      Pertinent Vitals/Pain Pain Assessment: Faces Faces Pain Scale: Hurts whole lot Pain Location: R-sided headache, R arm Pain Descriptors / Indicators: Grimacing;Guarding;Pressure;Headache Pain Intervention(s): Limited activity within patient's tolerance;Monitored during session;Repositioned;Patient requesting pain meds-RN notified;RN gave pain meds during session     PT Goals (current goals can now be found in the care plan section) Acute Rehab PT Goals Patient Stated Goal: to not hurt PT Goal Formulation: With patient Time For  Goal Achievement: 08/21/21 Potential to Achieve Goals: Fair Progress towards PT goals: Not progressing toward goals - comment (limited by headache)    Frequency    Min 2X/week      PT Plan Current plan remains appropriate       AM-PAC PT "6 Clicks" Mobility   Outcome Measure  Help needed turning from your back to your side while in a flat bed without using bedrails?: None Help needed moving from lying on your back to sitting on the side of a flat bed without using bedrails?: A Little Help needed moving to and from a bed to a chair (including a wheelchair)?: A Lot Help needed standing up from a chair using your arms (e.g., wheelchair or bedside chair)?: A Lot Help needed to walk in hospital room?: Total Help needed climbing 3-5 steps with a railing? : Total 6 Click Score: 13    End of Session   Activity Tolerance: Patient limited by pain Patient left: in bed;with call bell/phone within reach;with nursing/sitter in room Nurse Communication: Mobility status;Other (comment) (BP, dizziness, headache) PT Visit Diagnosis: Other abnormalities of gait and mobility (R26.89);Pain;Unsteadiness on feet (R26.81) Pain - Right/Left: Right Pain - part of body: Shoulder;Hip;Leg;Knee;Ankle and joints of foot (head)     Time: [id number] PT Time Calculation (min) (ACUTE ONLY): 14 min  Charges:  $Therapeutic Activity: 8-22 mins                     Absalom Aro B. Migdalia Dk PT, DPT Acute Rehabilitation Services Pager [phone] Office [phone]    [place] 08/17/2021, 10:44 AM

## 2021-08-17 NOTE — Progress Notes (Signed)
Report given to staff at [place] named [facility].

## 2021-08-17 NOTE — Progress Notes (Signed)
[facility] Progress Note   Subjective:   Patient seen and examined at bedside.  Irritated about being discharged today with ongoing headache/dizziness.  Patient not orthostatic on exam by PT this AM.  Dialysis catheter not running at optimal BFR yesterday.  Denies CP, SOB, n/v/d and abdominal pain.   Objective Vitals:   08/16/21 1749 08/16/21 2025 08/17/21 0432 08/17/21 0700  BP: (!) 155/69 139/70 (!) 148/74 (!) 109/52  Pulse: 93 91 80 76  Resp: '18 18 18 19  '$ Temp: 97.9 F (36.6 C) 98.6 F (37 C) 98.8 F (37.1 C) 98.4 F (36.9 C)  TempSrc: Oral Oral Oral Oral  SpO2: 97% 98% 98% 95%  Weight:      Height:       Physical Exam General:Well appearing, large male sitting on side of bed.  Heart:RRR, no mrg Lungs:CTAB, nml WOB on RA Abdomen:large, round, NT Extremities:L AKA, RLE no edema Dialysis Access: [facility], RU AVG +b/t   Filed Weights   08/13/21 1208 08/16/21 1321 08/16/21 1710  Weight: 122.4 kg 124 kg 122 kg    Intake/Output Summary (Last 24 hours) at 08/17/2021 1133 Last data filed at 08/17/2021 0433 Gross per 24 hour  Intake --  Output 2200 ml  Net -2200 ml    Additional Objective Labs: Basic Metabolic Panel: Recent Labs  Lab 08/11/21 0105 08/12/21 0341 08/12/21 1107 08/13/21 0840 08/16/21 1439  NA 129* 130* 132* 131* 129*  K 4.1 4.5 7.3* 4.1 4.0  CL 90* 94* 102 93* 89*  CO2 23 22  --  25 25  GLUCOSE 203* 588* 209* 351* 310*  BUN 110* 90* >130* 112* 118*  CREATININE 5.27* 4.09* 3.90* 4.00* 4.19*  CALCIUM 9.4 8.8*  --  8.2* 8.6*  PHOS 4.2  --   --  4.8* 4.8*   Liver Function Tests: Recent Labs  Lab 08/11/21 0105 08/13/21 0840 08/16/21 1439  ALBUMIN 3.6 3.2* 3.1*   CBC: Recent Labs  Lab 08/12/21 0341 08/12/21 1107 08/13/21 0840 08/16/21 1439  WBC 10.5  --  10.7* 9.4  NEUTROABS  --   --   --  7.5  HGB 10.2* 11.2* 10.5* 9.7*  HCT 30.5* 33.0* 32.3* 29.3*  MCV 83.6  --  85.0 85.7  PLT 124*  --  136* 157   CBG: Recent Labs   Lab 08/16/21 1747 08/16/21 2022 08/17/21 0021 08/17/21 0428 08/17/21 0734  GLUCAP 234* 277* 300* 145* 218*   Medications:   alteplase  2 mg Intracatheter Once   amLODipine  5 mg Oral QHS   aspirin EC  81 mg Oral Daily   atorvastatin  40 mg Oral Daily   calcium acetate  1,334 mg Oral TID with meals   carvedilol  12.5 mg Oral BID WC   Chlorhexidine Gluconate Cloth  6 each Topical Q0600   heparin  5,000 Units Subcutaneous Q8H   insulin aspart  0-15 Units Subcutaneous Q4H   insulin regular human CONCENTRATED  90 Units Subcutaneous BID WC   lidocaine  3 patch Transdermal Q24H   lidocaine  5 mL Other Once   methocarbamol  500 mg Oral TID   metolazone  10 mg Oral Daily   nortriptyline  20 mg Oral QHS   pantoprazole  20 mg Oral Daily   polyethylene glycol  17 g Oral Daily   pregabalin  25 mg Oral BID   senna-docusate  1 tablet Oral BID   sertraline  100 mg Oral Daily   torsemide  100  mg Oral Q T,Th,S,Su    Dialysis Orders: [facility]-MWF 3h 40mn  450/ 1.5  124.5kg  2/2.5 bath  [facility] TDC/ (RUA AVG - not in use) - venofer '100mg'$  x 1 , rec'd only 1 dose on 07/30/21   Assessment/Plan:   [place]  [place].  Headache/Dizziness - reports ongoing since surgery on 9/15.  Negative orthostatics. Not sleeping well. Does not tolerated CPAP use.  Encouraged to try to use once he is at home.  Dialysis access: s/p new RUE AVG per Dr. BTrula Slade9/15.  Can be accessed 4 weeks from surgery.  Issues with TDC not maintaining prescribed BFR during HD yesterday.  Cathflo dwell ordered post treatment.  [facility]   Uncontrolled DM2- Hypo +hyperglycemia.  Insulin per primary team.  BP/ volume - BP stable. Torsemide and metolazone on non HD days. Currently under EDW. Does not appear volume overloaded.  Euvolemic on exam, will need lower dry on d/c. ~122kg. Right Ankle Pain-MRI  (-) acute fracture but shows attenuated ligament likely remote tear, mild lateral hindfoot impingement, Tarsi syndrome, tibalis tenosynovitis, and distal tendinopathy .  Continue current pain management and PT per PMD.  Possible R Leg Radiculopathy-MRI spine performed: shows multilevel degenerative disc disorder with impingement on L4 nerve and compression of the cauda equina nerve root. Also concern for marrow signal abnormality at L5-S1. Discitis/Osteomyelitis currectly not suspected. Neurosurgery consulted -no indication for surgery.  Cramping/LE pain- On both HD and non HD days. Of note, he is taking large doses of loop diuretics and also metolazone which could be contributing. Dosing changed to non HD days with little improvement.  He is voiding large amounts daily. MRI showed ligamental injury. Lidocaine patch helping. Per primary.  Anemia ckd - Hgb 9.7.  Not on ESA, will start at outpatient unit.   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  Continue binder. Not on VDRA. Nutrition - alb 4.2.  On carb modified diet.  Dispo: Anticipated d/c to SNF  LJen Mow PA-C [facility] 08/17/2021,11:33 AM  LOS: 12 days

## 2021-08-17 NOTE — Progress Notes (Signed)
Patient observed during shift and denied any needs, resting in lounge chair and sleeping on and off. When assessed at 1815, patient stated he was in terrible pain and had pain all day. Given dilaudid 4 mg po for complaints of right leg and generalized body pain. Physician aware. Requested imitrex from pharmacy for complaint of headache. Report to [employer] RN

## 2021-08-17 NOTE — Discharge Summary (Signed)
Discharge Summary  Jeffrey [name] [id number] DOB: 09-24-1961  PCP: Pcp, No  Admit date: 08/03/2021 Discharge date: 08/17/2021  Time spent: 40 mins  Recommendations for Outpatient Follow-up:  PCP in 1 week- Goes to the [facility] Vascular surgery as scheduled/prn as discussed   Discharge Diagnoses:  Active Hospital Problems   Diagnosis Date Noted   Hypoglycemia 08/04/2021   ESRD (end stage renal disease) ([place]) 03/29/2021   COPD (chronic obstructive pulmonary disease) ([place]) 03/29/2021   Hypertension complicating diabetes ([place]) 03/29/2021    Resolved Hospital Problems  No resolved problems to display.    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  Presents presents with complaints of worsening pain on his right ankle.  He missed his hemodialysis secondary to this.  Was also hypoglycemic on arrival. Pt admitted for further management.    Today, pt still reluctant to be discharged, still reports persistent headache (doesn't sleep, not compliant with CPAP), reports dizziness (orthostatic neg). CT head unremarkable. No acute reason for inpatient stay. Pt discharged to SNF (high risk for re-admission).   Hospital Course:  Principal Problem:   Hypoglycemia Active Problems:   ESRD (end stage renal disease) (HCC)   COPD (chronic obstructive pulmonary disease) (HCC)   Hypertension complicating diabetes (HCC)   Right ankle pain with ligamental injury MRI negative for any acute fracture but shows attenuated talofibular ligament likely remote tear, mild lateral hindfoot impingement, edema showing sinus Tarsi syndrome, tibialis tenosynovitis and distal tendinopathy as  well as flexor hallucis longus tenosynovitis and plantar fasciitis. Discussed with orthopedics, Cam walker boot recommended Pain controlled with PO Dilaudid, lidocaine patch, lyrica PT recommends SNF   Concern for right leg radiculopathy Compression of the cauda equina nerve roots MRI L-spine performed.  Shows evidence of multilevel degenerative disc disorder with impingement on L4 nerve roots as well as compression of the cauda equina nerve roots. Neurosurgery on-call was contacted on 9/12, recommended to start the patient on IV Decadron 4 mg every 6 hours for 2 days and monitor response. Pain did not improve with steroids Neurosurgery officially consulted, no further intervention, outpatient follow up There is concern for marrow signal abnormality at L5-S1, but discitis or osteomyelitis not suspected clinically.  Blood cultures are negative.   Hypo and hyperglycemia (brittle) type 2 diabetes mellitus. Uncontrolled with hyper and hypoglycemia with renal complication. Long-term insulin use Intermittent non-compliant to diet Continue U500 95U BID, may add on sliding scale per CBGs Follow up with Endocrinologist   ESRD on HD MWF Vascular access placement Continue HD Vascular surgery consulted, s/p Right upper extremity AV graft placement on 08/12/21. Can be accessed 4 weeks from surgery.  Issues with TDC not maintaining prescribed BFR during HD on 08/16/21. Cathflo dwell ordered post treatment.  If continues to have issues with [facility] at outpatient HD, plan for [facility] exchange. Outpt vascular follow up prn   HLD Continue statin   Mood disorder Continue regimen   HTN Continue regimen   Morbid obesity OSA- non compliant with CPAP Continue to encourage use of CPAP Body mass index is 44.9 kg/m.    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 Treated with vancomycin through 6/18.   Bilateral rotator cuff injury. Patient scheduled for right rotator cuff surgery in October    Headache ?migraine R sided CT head negative Fioricet prn       Estimated body mass index is 44.76 kg/m as calculated from the following:   Height as of this encounter: '5\' 5"'$  (1.651 m).   Weight as of this encounter: 122 kg.    Procedures: s/p Right upper extremity AV graft placement on 08/12/21  Consultations: Nephrology Vascular surgery Neurosurgery  Discharge Exam: BP (!) 109/52 (BP Location: Left Wrist)   Pulse 76   Temp 98.4 F (36.9 C) (Oral)   Resp 19   Ht '5\' 5"'$  (1.651 m)   Wt 122 kg   SpO2 95%   BMI 44.76 kg/m   General: NAD  Cardiovascular: S1, S2 present Respiratory: CTAB Abdomen: Soft, nontender, nondistended, bowel sounds present Musculoskeletal: No R pedal edema noted, L AKA Skin: Normal Psychiatry: Normal mood     Discharge Instructions You were cared for by a hospitalist during your hospital stay. If you have any questions about your discharge medications or the care you received while you were in the hospital after you are discharged, you can call the unit and asked to speak with the hospitalist on call if the hospitalist that took care of you is not available. Once you are discharged, your primary care physician will handle any further medical issues. Please note that NO REFILLS for any discharge medications will be authorized once you are discharged, as it is imperative that you return to your primary care physician (or establish a relationship with a primary care physician if you do not have one) for your aftercare needs so that they can reassess your need for medications and monitor your lab values.  Discharge Instructions     Consult for [facility] Admission   Complete by: As directed    Diet - low sodium heart healthy   Complete by: As directed    Increase activity slowly   Complete by: As directed    No wound care   Complete by: As directed       Allergies as of 08/17/2021       Reactions   Bupropion Swelling    Dexmedetomidine Nausea And Vomiting   Other reaction(s): Other (See Comments) Dose-limiting bradycardia Dose-limiting bradycardia   Ibuprofen Shortness Of Breath, Swelling   Pt tolerates aspirin Pt tolerates aspirin   Pioglitazone Anaphylaxis, Rash   Tomato Anaphylaxis   Only allergic to RAW tomatoes        Medication List     STOP taking these medications    gabapentin 300 MG capsule Commonly known as: NEURONTIN   oxyCODONE 5 MG immediate release tablet Commonly known as: Oxy IR/ROXICODONE   vancomycin 1-5 GM/200ML-% Soln Commonly known as: VANCOCIN       TAKE these medications    (feeding supplement) PROSource Plus liquid Take 30 mLs by mouth 2 (two) times daily between meals.   acetaminophen 325 MG tablet Commonly known as: TYLENOL Take 2 tablets (650 mg total) by mouth every 6 (six) hours as needed for moderate pain.   albuterol (2.5 MG/3ML) 0.083% nebulizer solution Commonly known as: PROVENTIL Inhale 2.5 mg into the lungs every 8 (eight) hours as needed for wheezing or shortness of breath.   albuterol 108 (90 Base) MCG/ACT inhaler Commonly known as: VENTOLIN HFA Inhale 1-2 puffs into the lungs every 6 (six)  hours as needed for wheezing or shortness of breath.   amLODipine 5 MG tablet Commonly known as: NORVASC Take 5 mg by mouth at bedtime.   Aspirin Low Dose 81 MG EC tablet Generic drug: aspirin Take 81 mg by mouth daily.   atorvastatin 40 MG tablet Commonly known as: LIPITOR Take 40 mg by mouth daily.   butalbital-acetaminophen-caffeine 50-325-40 MG tablet Commonly known as: FIORICET Take 1 tablet by mouth every 6 (six) hours as needed for up to 5 days for headache.   calcium acetate 667 MG capsule Commonly known as: PHOSLO Take 2 capsules (1,334 mg total) by mouth with breakfast, with lunch, and with evening meal.   carvedilol 12.5 MG tablet Commonly known as: COREG Take 12.5 mg by mouth 2 (two) times daily with a meal.    Cholecalciferol 50 MCG (2000 UT) Caps Take 2,000 Units by mouth daily.   Dexcom G6 Receiver Devi Use 1 Device continuously   Dexcom G6 Sensor Misc Use 1 each every 10 (ten) days   Dexcom G6 Transmitter Misc Use 1 each every 3 (three) months   diazepam 5 MG tablet Commonly known as: VALIUM Take 5 mg by mouth 2 (two) times daily as needed for anxiety.   diphenhydrAMINE 25 MG tablet Commonly known as: BENADRYL Take 50 mg by mouth every 6 (six) hours as needed for allergies.   glucagon 1 MG injection Inject 1 mg into the muscle as needed (low blood sugar).   HumuLIN R U-500 KwikPen 500 UNIT/ML KwikPen Generic drug: insulin regular human CONCENTRATED Inject 95 Units into the skin 2 (two) times daily with a meal. What changed:  how much to take when to take this additional instructions   HYDROmorphone 2 MG tablet Commonly known as: DILAUDID Take 1 tablet (2 mg total) by mouth every 6 (six) hours as needed for moderate pain.   lidocaine 5 % Commonly known as: LIDODERM Place 3 patches onto the skin daily. Remove & Discard patch within 12 hours or as directed by MD   metolazone 10 MG tablet Commonly known as: ZAROXOLYN Take 10 mg by mouth daily.   nortriptyline 10 MG capsule Commonly known as: PAMELOR Take 20 mg by mouth at bedtime.   Ointment Base Oint Apply 1 application topically daily as needed (dry skin).   pantoprazole 20 MG tablet Commonly known as: PROTONIX Take 20 mg by mouth daily.   polyethylene glycol powder 17 GM/SCOOP powder Commonly known as: GLYCOLAX/MIRALAX Take 17 g by mouth daily as needed for mild constipation.   Precision QID Test test strip Generic drug: glucose blood 4 times daily accucheck guide meter   Accu-Chek Guide test strip Generic drug: glucose blood 4 (four) times daily.   pregabalin 25 MG capsule Commonly known as: LYRICA Take 1 capsule (25 mg total) by mouth 2 (two) times daily.   Procysbi 300 MG Pack Generic drug:  Cysteamine Bitartrate accucheck guide meter   senna-docusate 8.6-50 MG tablet Commonly known as: Senokot-S Take 2 tablets by mouth 2 (two) times daily as needed for mild constipation.   sertraline 100 MG tablet Commonly known as: ZOLOFT Take 100 mg by mouth daily.   torsemide 100 MG tablet Commonly known as: DEMADEX Take 1 tablet (100 mg total) by mouth every Tuesday, Thursday, Saturday, and Sunday. What changed:  medication strength when to take this       Allergies  Allergen Reactions   Bupropion Swelling   Dexmedetomidine Nausea And Vomiting    Other reaction(s): Other (See  Comments) Dose-limiting bradycardia Dose-limiting bradycardia    Ibuprofen Shortness Of Breath and Swelling    Pt tolerates aspirin Pt tolerates aspirin    Pioglitazone Anaphylaxis and Rash   Tomato Anaphylaxis    Only allergic to RAW tomatoes    Follow-up Information     [facility] Follow up.   Specialty: Vascular Surgery Why: As needed Contact information: [address] [place] [phone]                 The results of significant diagnostics from this hospitalization (including imaging, microbiology, ancillary and laboratory) are listed below for reference.    Significant Diagnostic Studies: DG Ankle Complete Right  Result Date: 08/03/2021 CLINICAL DATA:  Right ankle pain EXAM: RIGHT ANKLE - COMPLETE 3+ VIEW COMPARISON:  None. FINDINGS: There is mild widening of the lateral tibiotalar joint space which may reflect the sequela of a ligamentous injury involving the lateral ligaments. There is no associated fracture or frank dislocation. No ankle effusion. Tiny plantar calcaneal spur. Mild bimalleolar soft tissue swelling. IMPRESSION: Mild widening of the lateral joint space which may reflect the sequela of ligamentous instability or injury. Mild bimalleolar soft tissue swelling. Electronically Signed   By: Fidela Salisbury  M.D.   On: 08/03/2021 23:03   CT HEAD WO CONTRAST (5MM)  Result Date: 08/13/2021 CLINICAL DATA:  Dizziness with nonspecific unilateral headache. EXAM: CT HEAD WITHOUT CONTRAST TECHNIQUE: Contiguous axial images were obtained from the base of the skull through the vertex without intravenous contrast. COMPARISON:  MRI brain 04/02/2021 FINDINGS: Brain: Mild diffuse cerebral atrophy. Mild ventricular dilatation likely central atrophy. Patchy low-attenuation change in the deep white matter suggesting small vessel ischemia. No mass-effect or midline shift. No abnormal extra-axial fluid collections. Gray-white matter junctions are distinct. Basal cisterns are not effaced. No acute intracranial hemorrhage. Vascular: Mild intracranial vascular calcifications. Skull: Calvarium appears intact. Sinuses/Orbits: Retention cysts in the paranasal sinuses. No acute air-fluid levels. Mastoid air cells are clear. Other: None. IMPRESSION: No acute intracranial abnormalities. Mild chronic atrophy and small vessel ischemic changes. Electronically Signed   By: Lucienne Capers M.D.   On: 08/13/2021 19:41   MR LUMBAR SPINE WO CONTRAST  Result Date: 08/09/2021 CLINICAL DATA:  Lumbar radiculopathy EXAM: MRI LUMBAR SPINE WITHOUT CONTRAST TECHNIQUE: Multiplanar, multisequence MR imaging of the lumbar spine was performed. No intravenous contrast was administered. COMPARISON:  None. FINDINGS: Segmentation:  Standard Alignment: There is straightening of the normal lumbar spine lordosis. There is no antero or retrolisthesis. Vertebrae: Vertebral body heights are preserved. Marrow signal is somewhat heterogeneous throughout. There is a 1.0 cm focus of T1 hypointensity in the L2 vertebral body. There is mild marrow signal abnormality with endplate edema at 075-GRM with associated endplate irregularity. Conus medullaris and cauda equina: Conus extends to the mid L1 level. Conus and cauda equina appear normal. Paraspinal and other soft tissues:  The paraspinal soft tissues are unremarkable. T2 hyperintense renal lesions are incompletely characterized but likely reflects cysts. Disc levels: There is disc desiccation and narrowing at L3-L4 through L5-S1, most advanced at L5-S1. There is multilevel facet arthropathy, most advanced at L2-L3 and L4-L5. There associated facet joint effusions at multiple levels, most prominent on the left at L2-L3. T12-L1: No significant spinal canal or neural foraminal stenosis. L1-L2: There is mild bilateral facet arthropathy without significant spinal canal or neural foraminal stenosis. L2-L3: There is a mild disc bulge, ligamentum flavum thickening, and bilateral facet arthropathy resulting in mild spinal canal  stenosis with crowding of the left subarticular zone without evidence of nerve root impingement and mild bilateral neural foraminal stenosis. L3-L4: There is a diffuse disc bulge, ligamentum flavum thickening, degenerative endplate change, and bilateral facet arthropathy resulting in mild spinal canal stenosis with crowding of the left subarticular zone without evidence of nerve root impingement and mild to moderate bilateral neural foraminal stenosis. L4-L5: There is a prominent disc bulge, ligamentum flavum thickening, prominent dorsal epidural fat, and bilateral facet arthropathy resulting in severe spinal canal stenosis with compression of the cauda equina nerve roots and impingement of the descending left nerve roots in the subarticular zone and severe right and moderate left neural foraminal stenosis with impingement of the exiting right L4 nerve root. L5-S1: This level was not imaged in the axial plane, degrading evaluation. There is a diffuse disc bulge, degenerative endplate change, and bilateral facet arthropathy resulting in mild spinal canal stenosis and severe bilateral neural foraminal stenosis. IMPRESSION: 1. Marrow signal abnormality at L5-S1 with associated endplate irregularity is most likely  degenerative in nature; however, infection could have a similar appearance given elevated inflammatory markers. If discitis/osteomyelitis is of concern, recommend postcontrast imaging of the lumbar spine. 2. Degenerative changes at L4-L5 results in severe spinal canal stenosis with compression of the cauda equina nerve roots and impingement of the descending left nerve roots in the subarticular zone, and severe right and moderate left neural foraminal stenosis with impingement of the exiting right L4 nerve root. 3. Severe bilateral neural foraminal stenosis at L5-S1. 4. Crowding of the left subarticular zones at L2-L3 and L3-L4 without evidence of nerve root impingement. Varying degrees of spinal canal and neural foraminal stenosis at the remaining levels as detailed above. 5. Multilevel facet arthropathy with associated facet joint effusions, most prominent on the left at L2-L3. 6. 1.0 cm focus of T1 hypointensity in the L2 vertebral body is indeterminate. Correlation with prior imaging studies, if available, would be helpful to assess for stability. This can also be assessed at the time of postcontrast imaging of the lumbar spine. Electronically Signed   By: Valetta Mole M.D.   On: 08/09/2021 21:00   MR ANKLE RIGHT WO CONTRAST  Result Date: 08/05/2021 CLINICAL DATA:  Mild widening of the mortise and soft tissue swelling on ankle radiography of 08/03/2021. Right ankle pain. Prior left above the knee amputation. EXAM: MRI OF THE RIGHT ANKLE WITHOUT CONTRAST TECHNIQUE: Multiplanar, multisequence MR imaging of the ankle was performed. No intravenous contrast was administered. COMPARISON:  Radiographs 08/03/2021 FINDINGS: TENDONS Peroneal: Unremarkable Posteromedial: Mild tibialis posterior tenosynovitis and distal tendinopathy, correlate clinically in assessing for tibialis posterior dysfunction. Mild flexor hallucis longus tenosynovitis just proximal to the knot of Henry. Anterior: Unremarkable Achilles:  Unremarkable Plantar Fascia: Mild thickening of the medial band of the plantar fascia as on image 13 series 8, without substantial surrounding edema. Small plantar calcaneal spur. The thickening may reflect low grade plantar fasciitis. LIGAMENTS Lateral: Thickened but intact anterior inferior tibiofibular ligament. Attenuated anterior talofibular ligament, suspicious for chronic or remote tear, without local synovitis to suggest anterolateral impingement. Calcaneofibular ligament appears intact. Medial: Grossly intact deep tibiotalar portion of the deltoid ligament. Indistinctness of tissue planes along the tibionavicular and tibiospring components of the deltoid ligament. CARTILAGE Ankle Joint: There is slightly more fluid space laterally in the tibiotalar joint compared to medially for example on image 17 of series 6 although this amounts to less than 2 mm. 0.3 cm focus of subcortical marrow edema posteromedially along the talar  dome on image 16 series 6. Subtalar Joints/Sinus Tarsi: Low-level edema laterally along the subtalar joint on image 15 of series 9 may reflect low-grade lateral hindfoot impingement. There is edema in the sinus tarsi such that sinus tarsi syndrome is difficult to exclude. Bones: Osteochondral lesions of the calcaneocuboid joint are noted dorsally for example on image 16 series 7, with notable dorsal spurring. There is dorsal midfoot spurring as well. Other: No supplemental non-categorized findings. IMPRESSION: 1. Attenuated anterior talofibular ligament, likely from chronic or remote tear. This could partially facilitate some of the equivocal widening in the lateral tibiotalar joint space, although the calcaneofibular ligament and posterior talofibular ligament appear intact. 2. Subcortical marrow edema along the lateral subtalar joint on image 15 series 8 raises the possibility of mild lateral hindfoot impingement. 3. Edema in the sinus tarsi, cannot exclude sinus tarsi syndrome. 4.  Substantial confluent osteochondral lesions of the dorsal calcaneocuboid joint attributed to arthropathy. 5. Ill definition of tibiospring and tibionavicular portions of the deltoid ligament, potentially chronically injured or sprained. The deep tibiotalar portion appears intact. 6. Mild tibialis posterior tenosynovitis and distal tendinopathy, correlate clinically in assessing for tibialis posterior dysfunction. 7. Mild flexor hallucis longus tenosynovitis. 8. Low-grade plantar fasciitis suspected along the medial band plantar fascia. Electronically Signed   By: Van Clines M.D.   On: 08/05/2021 17:11   PERIPHERAL VASCULAR CATHETERIZATION  Result Date: 08/10/2021 Images from the original result were not included. Patient name: Wilbern Thai MRN: [id number] DOB: 1961-11-01 Sex: male 08/03/2021 - 08/10/2021 Pre-operative Diagnosis: ESRD Post-operative diagnosis:  Same Surgeon:  Annamarie Major Procedure Performed:  1.  Right-sided central venogram  Indications: The patient is here today for access planning.  He has had a right upper arm loop graft which is occluded.  He has a catheter on the left side Procedure:  The patient was identified in the holding area and taken to room 8.  The IV in the right arm was utilized for contrast administration and fluoroscopic images were obtained Findings: Right axillary and subclavian vein are patent without any significant stenosis.  The right innominate vein appears to terminate near the catheter coming in from the left innominate vein.  There are images that appear to show contrast going past this area.  There were no prominent collaterals.  Impression:  #1  The right axillary, subclavian and innominate vein fully opacify.  No collaterals were visualized, however contrast appears to be held up where the catheter from the left side joints centrally.  The catheter is likely partially obstructive. Theotis Burrow, M.D., FACS [facility] Office:  [phone] Pager:  [phone]   DG Chest Portable 1 View  Result Date: 08/04/2021 CLINICAL DATA:  Missed dialysis.  Chest pain EXAM: PORTABLE CHEST 1 VIEW COMPARISON:  03/28/2021 FINDINGS: Left dialysis catheter in place with the tip in the SVC. Heart and mediastinal contours are within normal limits. No focal opacities or effusions. No acute bony abnormality. IMPRESSION: No active cardiopulmonary disease. Electronically Signed   By: Rolm Baptise M.D.   On: 08/04/2021 02:46    Microbiology: Recent Results (from the past 240 hour(s))  Resp Panel by RT-PCR (Flu A&B, Covid) Nasopharyngeal Swab     Status: None   Collection Time: 08/16/21 11:30 AM   Specimen: Nasopharyngeal Swab; Nasopharyngeal(NP) swabs in vial transport medium  Result Value Ref Range Status   SARS Coronavirus 2 by RT PCR NEGATIVE NEGATIVE Final    Comment: (NOTE) SARS-CoV-2 target nucleic acids are NOT  DETECTED.  The SARS-CoV-2 RNA is generally detectable in upper respiratory specimens during the acute phase of infection. The lowest concentration of SARS-CoV-2 viral copies this assay can detect is 138 copies/mL. A negative result does not preclude SARS-Cov-2 infection and should not be used as the sole basis for treatment or other patient management decisions. A negative result may occur with  improper specimen collection/handling, submission of specimen other than nasopharyngeal swab, presence of viral mutation(s) within the areas targeted by this assay, and inadequate number of viral copies(<138 copies/mL). A negative result must be combined with clinical observations, patient history, and epidemiological information. The expected result is Negative.  Fact Sheet for Patients:  EntrepreneurPulse.com.au  Fact Sheet for Healthcare Providers:  IncredibleEmployment.be  This test is no t yet approved or cleared by the [place] FDA and  has been authorized for detection and/or  diagnosis of SARS-CoV-2 by FDA under an Emergency Use Authorization (EUA). This EUA will remain  in effect (meaning this test can be used) for the duration of the COVID-19 declaration under Section 564(b)(1) of the Act, 21 U.S.C.section 360bbb-3(b)(1), unless the authorization is terminated  or revoked sooner.       Influenza A by PCR NEGATIVE NEGATIVE Final   Influenza B by PCR NEGATIVE NEGATIVE Final    Comment: (NOTE) The Xpert Xpress SARS-CoV-2/FLU/RSV plus assay is intended as an aid in the diagnosis of influenza from Nasopharyngeal swab specimens and should not be used as a sole basis for treatment. Nasal washings and aspirates are unacceptable for Xpert Xpress SARS-CoV-2/FLU/RSV testing.  Fact Sheet for Patients: EntrepreneurPulse.com.au  Fact Sheet for Healthcare Providers: IncredibleEmployment.be  This test is not yet approved or cleared by the [place] FDA and has been authorized for detection and/or diagnosis of SARS-CoV-2 by FDA under an Emergency Use Authorization (EUA). This EUA will remain in effect (meaning this test can be used) for the duration of the COVID-19 declaration under Section 564(b)(1) of the Act, 21 U.S.C. section 360bbb-3(b)(1), unless the authorization is terminated or revoked.  Performed at [facility], [place] [address] [place], [place] [address]      Labs: Basic Metabolic Panel: Recent Labs  Lab 08/11/21 0105 08/12/21 0341 08/12/21 1107 08/13/21 0840 08/16/21 1439  NA 129* 130* 132* 131* 129*  K 4.1 4.5 7.3* 4.1 4.0  CL 90* 94* 102 93* 89*  CO2 23 22  --  25 25  GLUCOSE 203* 588* 209* 351* 310*  BUN 110* 90* >130* 112* 118*  CREATININE 5.27* 4.09* 3.90* 4.00* 4.19*  CALCIUM 9.4 8.8*  --  8.2* 8.6*  PHOS 4.2  --   --  4.8* 4.8*   Liver Function Tests: Recent Labs  Lab 08/11/21 0105 08/13/21 0840 08/16/21 1439  ALBUMIN 3.6 3.2* 3.1*   No results for input(s): LIPASE, AMYLASE in  the last 168 hours. No results for input(s): AMMONIA in the last 168 hours. CBC: Recent Labs  Lab 08/12/21 0341 08/12/21 1107 08/13/21 0840 08/16/21 1439  WBC 10.5  --  10.7* 9.4  NEUTROABS  --   --   --  7.5  HGB 10.2* 11.2* 10.5* 9.7*  HCT 30.5* 33.0* 32.3* 29.3*  MCV 83.6  --  85.0 85.7  PLT 124*  --  136* 157   Cardiac Enzymes: No results for input(s): CKTOTAL, CKMB, CKMBINDEX, TROPONINI in the last 168 hours. BNP: BNP (last 3 results) No results for input(s): BNP in the last 8760 hours.  ProBNP (last 3 results) No results  for input(s): PROBNP in the last 8760 hours.  CBG: Recent Labs  Lab 08/16/21 2022 08/17/21 0021 08/17/21 0428 08/17/21 0734 08/17/21 1142  GLUCAP 277* 300* 145* 218* 332*       Signed:  Alma Friendly, MD [facility] 08/17/2021, 12:21 PM

## 2021-08-17 NOTE — Progress Notes (Signed)
Attempted to report to [facility] for patient, being transferred today. Called [phone] and left a message for them to call back for report on patient, being admitted to Room # 107. Notified [employer] RN of unsuccessful attempt of report.

## 2021-08-18 NOTE — Progress Notes (Signed)
Left the unit with [facility] via stretcher. Alert and oriented x 4. Not in distress. Patient verbalized that pain decreased to 5/10 after pain meds.

## 2021-08-30 ENCOUNTER — Inpatient Hospital Stay (HOSPITAL_COMMUNITY)
Admission: EM | Admit: 2021-08-30 | Discharge: 2021-09-05 | DRG: 981 | Disposition: A | Discharge status: Skilled Nursing Facility | Payer: Medicare Other | Attending: Internal Medicine | Admitting: Internal Medicine

## 2021-08-30 ENCOUNTER — Emergency Department (HOSPITAL_BASED_OUTPATIENT_CLINIC_OR_DEPARTMENT_OTHER): Payer: Medicare Other

## 2021-08-30 ENCOUNTER — Encounter (HOSPITAL_COMMUNITY): Payer: Self-pay | Admitting: Emergency Medicine

## 2021-08-30 ENCOUNTER — Encounter (HOSPITAL_COMMUNITY)
Admission: EM | Disposition: A | Discharge status: Skilled Nursing Facility | Payer: Self-pay | Source: Home / Self Care | Attending: Internal Medicine

## 2021-08-30 DIAGNOSIS — Z6841 Body Mass Index (BMI) 40.0 and over, adult: Secondary | ICD-10-CM

## 2021-08-30 DIAGNOSIS — Z89612 Acquired absence of left leg above knee: Secondary | ICD-10-CM

## 2021-08-30 DIAGNOSIS — Z888 Allergy status to other drugs, medicaments and biological substances status: Secondary | ICD-10-CM

## 2021-08-30 DIAGNOSIS — R1110 Vomiting, unspecified: Secondary | ICD-10-CM

## 2021-08-30 DIAGNOSIS — T82858A Stenosis of vascular prosthetic devices, implants and grafts, initial encounter: Secondary | ICD-10-CM | POA: Diagnosis present

## 2021-08-30 DIAGNOSIS — E1159 Type 2 diabetes mellitus with other circulatory complications: Secondary | ICD-10-CM | POA: Diagnosis present

## 2021-08-30 DIAGNOSIS — N2581 Secondary hyperparathyroidism of renal origin: Secondary | ICD-10-CM | POA: Diagnosis present

## 2021-08-30 DIAGNOSIS — T82898A Other specified complication of vascular prosthetic devices, implants and grafts, initial encounter: Secondary | ICD-10-CM | POA: Diagnosis not present

## 2021-08-30 DIAGNOSIS — D631 Anemia in chronic kidney disease: Secondary | ICD-10-CM | POA: Diagnosis present

## 2021-08-30 DIAGNOSIS — R112 Nausea with vomiting, unspecified: Principal | ICD-10-CM | POA: Diagnosis present

## 2021-08-30 DIAGNOSIS — Z5320 Procedure and treatment not carried out because of patient's decision for unspecified reasons: Secondary | ICD-10-CM | POA: Diagnosis not present

## 2021-08-30 DIAGNOSIS — E785 Hyperlipidemia, unspecified: Secondary | ICD-10-CM | POA: Diagnosis present

## 2021-08-30 DIAGNOSIS — E1169 Type 2 diabetes mellitus with other specified complication: Secondary | ICD-10-CM | POA: Diagnosis present

## 2021-08-30 DIAGNOSIS — M79601 Pain in right arm: Secondary | ICD-10-CM

## 2021-08-30 DIAGNOSIS — F4310 Post-traumatic stress disorder, unspecified: Secondary | ICD-10-CM | POA: Diagnosis present

## 2021-08-30 DIAGNOSIS — Z91018 Allergy to other foods: Secondary | ICD-10-CM

## 2021-08-30 DIAGNOSIS — M1990 Unspecified osteoarthritis, unspecified site: Secondary | ICD-10-CM | POA: Diagnosis present

## 2021-08-30 DIAGNOSIS — I152 Hypertension secondary to endocrine disorders: Secondary | ICD-10-CM | POA: Diagnosis present

## 2021-08-30 DIAGNOSIS — N186 End stage renal disease: Secondary | ICD-10-CM | POA: Diagnosis present

## 2021-08-30 DIAGNOSIS — Z20822 Contact with and (suspected) exposure to covid-19: Secondary | ICD-10-CM | POA: Diagnosis present

## 2021-08-30 DIAGNOSIS — I509 Heart failure, unspecified: Secondary | ICD-10-CM | POA: Diagnosis present

## 2021-08-30 DIAGNOSIS — E1122 Type 2 diabetes mellitus with diabetic chronic kidney disease: Secondary | ICD-10-CM | POA: Diagnosis present

## 2021-08-30 DIAGNOSIS — T82590A Other mechanical complication of surgically created arteriovenous fistula, initial encounter: Secondary | ICD-10-CM | POA: Diagnosis not present

## 2021-08-30 DIAGNOSIS — Z79899 Other long term (current) drug therapy: Secondary | ICD-10-CM

## 2021-08-30 DIAGNOSIS — E1151 Type 2 diabetes mellitus with diabetic peripheral angiopathy without gangrene: Secondary | ICD-10-CM | POA: Diagnosis present

## 2021-08-30 DIAGNOSIS — N185 Chronic kidney disease, stage 5: Secondary | ICD-10-CM | POA: Diagnosis not present

## 2021-08-30 DIAGNOSIS — T82511A Breakdown (mechanical) of surgically created arteriovenous shunt, initial encounter: Principal | ICD-10-CM | POA: Diagnosis present

## 2021-08-30 DIAGNOSIS — F39 Unspecified mood [affective] disorder: Secondary | ICD-10-CM | POA: Diagnosis present

## 2021-08-30 DIAGNOSIS — Y712 Prosthetic and other implants, materials and accessory cardiovascular devices associated with adverse incidents: Secondary | ICD-10-CM | POA: Diagnosis present

## 2021-08-30 DIAGNOSIS — G546 Phantom limb syndrome with pain: Secondary | ICD-10-CM | POA: Diagnosis present

## 2021-08-30 DIAGNOSIS — Z87891 Personal history of nicotine dependence: Secondary | ICD-10-CM

## 2021-08-30 DIAGNOSIS — Z794 Long term (current) use of insulin: Secondary | ICD-10-CM

## 2021-08-30 DIAGNOSIS — Z7982 Long term (current) use of aspirin: Secondary | ICD-10-CM

## 2021-08-30 DIAGNOSIS — R042 Hemoptysis: Secondary | ICD-10-CM | POA: Diagnosis not present

## 2021-08-30 DIAGNOSIS — M7989 Other specified soft tissue disorders: Secondary | ICD-10-CM | POA: Diagnosis not present

## 2021-08-30 DIAGNOSIS — Z8614 Personal history of Methicillin resistant Staphylococcus aureus infection: Secondary | ICD-10-CM

## 2021-08-30 DIAGNOSIS — Z886 Allergy status to analgesic agent status: Secondary | ICD-10-CM

## 2021-08-30 DIAGNOSIS — Z992 Dependence on renal dialysis: Secondary | ICD-10-CM

## 2021-08-30 DIAGNOSIS — I132 Hypertensive heart and chronic kidney disease with heart failure and with stage 5 chronic kidney disease, or end stage renal disease: Secondary | ICD-10-CM | POA: Diagnosis present

## 2021-08-30 DIAGNOSIS — J449 Chronic obstructive pulmonary disease, unspecified: Secondary | ICD-10-CM | POA: Diagnosis present

## 2021-08-30 DIAGNOSIS — F32A Depression, unspecified: Secondary | ICD-10-CM | POA: Diagnosis present

## 2021-08-30 HISTORY — PX: PERIPHERAL VASCULAR BALLOON ANGIOPLASTY: CATH118281

## 2021-08-30 HISTORY — PX: A/V FISTULAGRAM: CATH118298

## 2021-08-30 LAB — CBC WITH DIFFERENTIAL/PLATELET
Abs Immature Granulocytes: 0.09 10*3/uL — ABNORMAL HIGH (ref 0.00–0.07)
Basophils Absolute: 0 10*3/uL (ref 0.0–0.1)
Basophils Relative: 0 %
Eosinophils Absolute: 0.2 10*3/uL (ref 0.0–0.5)
Eosinophils Relative: 4 %
HCT: 29.1 % — ABNORMAL LOW (ref 39.0–52.0)
Hemoglobin: 9 g/dL — ABNORMAL LOW (ref 13.0–17.0)
Immature Granulocytes: 1 %
Lymphocytes Relative: 15 %
Lymphs Abs: 0.9 10*3/uL (ref 0.7–4.0)
MCH: 27.9 pg (ref 26.0–34.0)
MCHC: 30.9 g/dL (ref 30.0–36.0)
MCV: 90.1 fL (ref 80.0–100.0)
Monocytes Absolute: 0.5 10*3/uL (ref 0.1–1.0)
Monocytes Relative: 8 %
Neutro Abs: 4.5 10*3/uL (ref 1.7–7.7)
Neutrophils Relative %: 72 %
Platelets: 130 10*3/uL — ABNORMAL LOW (ref 150–400)
RBC: 3.23 MIL/uL — ABNORMAL LOW (ref 4.22–5.81)
RDW: 16.2 % — ABNORMAL HIGH (ref 11.5–15.5)
WBC: 6.2 10*3/uL (ref 4.0–10.5)
nRBC: 0 % (ref 0.0–0.2)

## 2021-08-30 LAB — COMPREHENSIVE METABOLIC PANEL
ALT: 18 U/L (ref 0–44)
AST: 16 U/L (ref 15–41)
Albumin: 3.2 g/dL — ABNORMAL LOW (ref 3.5–5.0)
Alkaline Phosphatase: 77 U/L (ref 38–126)
Anion gap: 11 (ref 5–15)
BUN: 63 mg/dL — ABNORMAL HIGH (ref 8–23)
CO2: 24 mmol/L (ref 22–32)
Calcium: 8.6 mg/dL — ABNORMAL LOW (ref 8.9–10.3)
Chloride: 102 mmol/L (ref 98–111)
Creatinine, Ser: 3 mg/dL — ABNORMAL HIGH (ref 0.61–1.24)
GFR, Estimated: 23 mL/min — ABNORMAL LOW (ref >60)
Glucose, Bld: 148 mg/dL — ABNORMAL HIGH (ref 70–99)
Potassium: 4 mmol/L (ref 3.5–5.1)
Sodium: 137 mmol/L (ref 135–145)
Total Bilirubin: 0.4 mg/dL (ref 0.3–1.2)
Total Protein: 6.3 g/dL — ABNORMAL LOW (ref 6.5–8.1)

## 2021-08-30 LAB — CBG MONITORING, ED
Glucose-Capillary: 57 mg/dL — ABNORMAL LOW (ref 70–99)
Glucose-Capillary: 125 mg/dL — ABNORMAL HIGH (ref 70–99)

## 2021-08-30 LAB — GLUCOSE, CAPILLARY
Glucose-Capillary: 63 mg/dL — ABNORMAL LOW (ref 70–99)
Glucose-Capillary: 115 mg/dL — ABNORMAL HIGH (ref 70–99)
Glucose-Capillary: 308 mg/dL — ABNORMAL HIGH (ref 70–99)

## 2021-08-30 LAB — RESP PANEL BY RT-PCR (FLU A&B, COVID) ARPGX2
Influenza A by PCR: NEGATIVE
Influenza B by PCR: NEGATIVE
SARS Coronavirus 2 by RT PCR: NEGATIVE

## 2021-08-30 SURGERY — A/V FISTULAGRAM
Anesthesia: LOCAL | Laterality: Right

## 2021-08-30 MED ORDER — MORPHINE SULFATE (PF) 2 MG/ML IV SOLN
INTRAVENOUS | Status: AC
  Filled 2021-08-30: qty 1

## 2021-08-30 MED ORDER — HYDRALAZINE HCL 20 MG/ML IJ SOLN: 5.0000 mg | INTRAMUSCULAR | Status: DC | PRN

## 2021-08-30 MED ORDER — CALCIUM ACETATE (PHOS BINDER) 667 MG PO CAPS
1334.0000 mg | ORAL_CAPSULE | Freq: Three times a day (TID) | ORAL | Status: DC
  Administered 2021-08-31 – 2021-09-05 (×13): 1334 mg via ORAL
  Filled 2021-08-30 (×13): qty 2

## 2021-08-30 MED ORDER — ACETAMINOPHEN 325 MG PO TABS: 650.0000 mg | ORAL_TABLET | Freq: Four times a day (QID) | ORAL | Status: DC | PRN

## 2021-08-30 MED ORDER — ONDANSETRON HCL 4 MG/2ML IJ SOLN
4.0000 mg | Freq: Four times a day (QID) | INTRAMUSCULAR | Status: DC | PRN
  Administered 2021-08-31 – 2021-09-02 (×6): 4 mg via INTRAVENOUS
  Filled 2021-08-30 (×7): qty 2

## 2021-08-30 MED ORDER — PANTOPRAZOLE SODIUM 20 MG PO TBEC
20.0000 mg | DELAYED_RELEASE_TABLET | Freq: Every day | ORAL | Status: DC
  Administered 2021-08-31 – 2021-09-02 (×3): 20 mg via ORAL
  Filled 2021-08-30 (×3): qty 1

## 2021-08-30 MED ORDER — SENNOSIDES-DOCUSATE SODIUM 8.6-50 MG PO TABS
2.0000 | ORAL_TABLET | Freq: Two times a day (BID) | ORAL | Status: DC | PRN
  Administered 2021-09-04: 2 via ORAL
  Filled 2021-08-30: qty 2

## 2021-08-30 MED ORDER — SERTRALINE HCL 100 MG PO TABS
100.0000 mg | ORAL_TABLET | Freq: Every day | ORAL | Status: DC
  Administered 2021-08-31 – 2021-09-05 (×6): 100 mg via ORAL
  Filled 2021-08-30 (×6): qty 1

## 2021-08-30 MED ORDER — HEPARIN SODIUM (PORCINE) 5000 UNIT/ML IJ SOLN
5000.0000 [IU] | Freq: Three times a day (TID) | INTRAMUSCULAR | Status: DC
  Administered 2021-08-30 – 2021-09-05 (×12): 5000 [IU] via SUBCUTANEOUS
  Filled 2021-08-30 (×13): qty 1

## 2021-08-30 MED ORDER — NEPRO/CARBSTEADY PO LIQD: 237.0000 mL | Freq: Three times a day (TID) | ORAL | Status: DC | PRN

## 2021-08-30 MED ORDER — PREGABALIN 25 MG PO CAPS
25.0000 mg | ORAL_CAPSULE | Freq: Two times a day (BID) | ORAL | Status: DC
  Administered 2021-08-30 – 2021-09-05 (×12): 25 mg via ORAL
  Filled 2021-08-30 (×12): qty 1

## 2021-08-30 MED ORDER — IODIXANOL 320 MG/ML IV SOLN
INTRAVENOUS | Status: DC | PRN
  Administered 2021-08-30: 60 mL

## 2021-08-30 MED ORDER — CALCIUM CARBONATE ANTACID 1250 MG/5ML PO SUSP
500.0000 mg | Freq: Four times a day (QID) | ORAL | Status: DC | PRN
  Filled 2021-08-30: qty 5

## 2021-08-30 MED ORDER — MIDAZOLAM HCL 2 MG/2ML IJ SOLN
INTRAMUSCULAR | Status: AC
  Filled 2021-08-30: qty 2

## 2021-08-30 MED ORDER — ASPIRIN EC 81 MG PO TBEC
81.0000 mg | DELAYED_RELEASE_TABLET | Freq: Every day | ORAL | Status: DC
  Administered 2021-08-31 – 2021-09-05 (×6): 81 mg via ORAL
  Filled 2021-08-30 (×6): qty 1

## 2021-08-30 MED ORDER — FENTANYL CITRATE PF 50 MCG/ML IJ SOSY
50.0000 ug | PREFILLED_SYRINGE | Freq: Once | INTRAMUSCULAR | Status: AC
  Administered 2021-08-30: 50 ug via INTRAVENOUS
  Filled 2021-08-30 (×2): qty 1

## 2021-08-30 MED ORDER — POLYETHYLENE GLYCOL 3350 17 GM/SCOOP PO POWD
17.0000 g | Freq: Every day | ORAL | Status: DC | PRN
  Filled 2021-08-30: qty 255

## 2021-08-30 MED ORDER — CARVEDILOL 12.5 MG PO TABS
12.5000 mg | ORAL_TABLET | Freq: Two times a day (BID) | ORAL | Status: DC
  Administered 2021-08-30 – 2021-09-05 (×12): 12.5 mg via ORAL
  Filled 2021-08-30 (×13): qty 1

## 2021-08-30 MED ORDER — ONDANSETRON HCL 4 MG PO TABS
4.0000 mg | ORAL_TABLET | Freq: Four times a day (QID) | ORAL | Status: DC | PRN
  Administered 2021-09-02: 4 mg via ORAL
  Filled 2021-08-30: qty 1

## 2021-08-30 MED ORDER — INSULIN REGULAR HUMAN (CONC) 500 UNIT/ML ~~LOC~~ SOPN
95.0000 [IU] | PEN_INJECTOR | Freq: Two times a day (BID) | SUBCUTANEOUS | Status: DC
  Administered 2021-08-31: 95 [IU] via SUBCUTANEOUS
  Filled 2021-08-30: qty 3

## 2021-08-30 MED ORDER — ZOLPIDEM TARTRATE 5 MG PO TABS
5.0000 mg | ORAL_TABLET | Freq: Every evening | ORAL | Status: DC | PRN
  Administered 2021-08-31 – 2021-09-02 (×2): 5 mg via ORAL
  Filled 2021-08-30 (×2): qty 1

## 2021-08-30 MED ORDER — LIDOCAINE HCL (PF) 1 % IJ SOLN
INTRAMUSCULAR | Status: DC | PRN
  Administered 2021-08-30: 4 mL

## 2021-08-30 MED ORDER — OXYCODONE-ACETAMINOPHEN 5-325 MG PO TABS
1.0000 | ORAL_TABLET | Freq: Four times a day (QID) | ORAL | Status: DC | PRN
  Administered 2021-08-31: 1 via ORAL
  Administered 2021-08-31: 2 via ORAL
  Administered 2021-09-01 (×3): 1 via ORAL
  Administered 2021-09-05: 2 via ORAL
  Filled 2021-08-30: qty 2
  Filled 2021-08-30 (×2): qty 1
  Filled 2021-08-30: qty 2
  Filled 2021-08-30 (×4): qty 1
  Filled 2021-08-30: qty 2

## 2021-08-30 MED ORDER — AMLODIPINE BESYLATE 5 MG PO TABS
5.0000 mg | ORAL_TABLET | Freq: Every day | ORAL | Status: DC
  Administered 2021-08-30 – 2021-09-04 (×6): 5 mg via ORAL
  Filled 2021-08-30 (×6): qty 1

## 2021-08-30 MED ORDER — CAMPHOR-MENTHOL 0.5-0.5 % EX LOTN: 1.0000 "application " | TOPICAL_LOTION | Freq: Three times a day (TID) | CUTANEOUS | Status: DC | PRN

## 2021-08-30 MED ORDER — ALBUTEROL SULFATE (2.5 MG/3ML) 0.083% IN NEBU: 2.5000 mg | INHALATION_SOLUTION | RESPIRATORY_TRACT | Status: DC | PRN

## 2021-08-30 MED ORDER — MIDAZOLAM HCL 2 MG/2ML IJ SOLN
INTRAMUSCULAR | Status: DC | PRN
  Administered 2021-08-30: .5 mg via INTRAVENOUS

## 2021-08-30 MED ORDER — ACETAMINOPHEN 650 MG RE SUPP: 650.0000 mg | Freq: Four times a day (QID) | RECTAL | Status: DC | PRN

## 2021-08-30 MED ORDER — ALBUTEROL SULFATE HFA 108 (90 BASE) MCG/ACT IN AERS: 1.0000 | INHALATION_SPRAY | Freq: Four times a day (QID) | RESPIRATORY_TRACT | Status: DC | PRN

## 2021-08-30 MED ORDER — FENTANYL CITRATE (PF) 100 MCG/2ML IJ SOLN
INTRAMUSCULAR | Status: DC | PRN
  Administered 2021-08-30: 25 ug via INTRAVENOUS

## 2021-08-30 MED ORDER — HEPARIN (PORCINE) IN NACL 1000-0.9 UT/500ML-% IV SOLN
INTRAVENOUS | Status: AC
  Filled 2021-08-30: qty 1000

## 2021-08-30 MED ORDER — MORPHINE SULFATE (PF) 2 MG/ML IV SOLN
2.0000 mg | INTRAVENOUS | Status: DC | PRN
  Administered 2021-08-30 – 2021-08-31 (×4): 2 mg via INTRAVENOUS
  Filled 2021-08-30 (×4): qty 1

## 2021-08-30 MED ORDER — HEPARIN (PORCINE) IN NACL 1000-0.9 UT/500ML-% IV SOLN
INTRAVENOUS | Status: DC | PRN
  Administered 2021-08-30: 500 mL

## 2021-08-30 MED ORDER — ATORVASTATIN CALCIUM 40 MG PO TABS
40.0000 mg | ORAL_TABLET | Freq: Every day | ORAL | Status: DC
  Administered 2021-08-31 – 2021-09-05 (×6): 40 mg via ORAL
  Filled 2021-08-30 (×6): qty 1

## 2021-08-30 MED ORDER — INSULIN ASPART 100 UNIT/ML IJ SOLN
0.0000 [IU] | Freq: Three times a day (TID) | INTRAMUSCULAR | Status: DC
  Administered 2021-08-30 – 2021-08-31 (×2): 4 [IU] via SUBCUTANEOUS
  Administered 2021-09-02: 3 [IU] via SUBCUTANEOUS
  Administered 2021-09-03: 4 [IU] via SUBCUTANEOUS
  Administered 2021-09-04: 5 [IU] via SUBCUTANEOUS
  Administered 2021-09-04: 4 [IU] via SUBCUTANEOUS
  Administered 2021-09-04: 3 [IU] via SUBCUTANEOUS
  Administered 2021-09-05: 1 [IU] via SUBCUTANEOUS

## 2021-08-30 MED ORDER — FENTANYL CITRATE (PF) 100 MCG/2ML IJ SOLN
INTRAMUSCULAR | Status: AC
  Filled 2021-08-30: qty 2

## 2021-08-30 MED ORDER — NORTRIPTYLINE HCL 10 MG PO CAPS
20.0000 mg | ORAL_CAPSULE | Freq: Every day | ORAL | Status: DC
  Administered 2021-08-31 – 2021-09-04 (×5): 20 mg via ORAL
  Filled 2021-08-30 (×7): qty 2

## 2021-08-30 MED ORDER — HYDROXYZINE HCL 25 MG PO TABS: 25.0000 mg | ORAL_TABLET | Freq: Three times a day (TID) | ORAL | Status: DC | PRN

## 2021-08-30 MED ORDER — LIDOCAINE HCL (PF) 1 % IJ SOLN
INTRAMUSCULAR | Status: AC
  Filled 2021-08-30: qty 30

## 2021-08-30 MED ORDER — POLYETHYLENE GLYCOL 3350 17 G PO PACK
17.0000 g | PACK | Freq: Every day | ORAL | Status: DC | PRN
  Administered 2021-09-03 – 2021-09-04 (×2): 17 g via ORAL
  Filled 2021-08-30 (×2): qty 1

## 2021-08-30 SURGICAL SUPPLY — 18 items
BALLN LUTONIX AV 12X40X75 (BALLOONS) ×3
BALLN MUSTANG 10.0X40 75 (BALLOONS) ×3
BALLN MUSTANG 12.0X40 75 (BALLOONS) ×3
BALLOON LUTONIX AV 12X40X75 (BALLOONS) ×2 IMPLANT
BALLOON MUSTANG 10.0X40 75 (BALLOONS) ×2 IMPLANT
BALLOON MUSTANG 12.0X40 75 (BALLOONS) ×2 IMPLANT
CATH ANGIO 5F BER2 65CM (CATHETERS) ×3 IMPLANT
COVER DOME SNAP 22 D (MISCELLANEOUS) ×3 IMPLANT
KIT ENCORE 26 ADVANTAGE (KITS) ×3 IMPLANT
PROTECTION STATION PRESSURIZED (MISCELLANEOUS) ×3
SHEATH MICROPUNCTURE PEDAL 5FR (SHEATH) ×3 IMPLANT
SHEATH PINNACLE R/O II 7F 4CM (SHEATH) ×3 IMPLANT
SHEATH PROBE COVER 6X72 (BAG) ×3 IMPLANT
STATION PROTECTION PRESSURIZED (MISCELLANEOUS) ×2 IMPLANT
STOPCOCK MORSE 400PSI 3WAY (MISCELLANEOUS) ×3 IMPLANT
TRAY PV CATH (CUSTOM PROCEDURE TRAY) ×3 IMPLANT
TUBING CIL FLEX 10 FLL-RA (TUBING) ×3 IMPLANT
WIRE BENTSON .035X145CM (WIRE) ×3 IMPLANT

## 2021-08-30 NOTE — ED Triage Notes (Addendum)
Patient BIB [facility] here for evaluation of right arm swelling that started approximately two weeks ago after AV graft on the upper right arm. Right arm has non pitting edema. Sharp pain starts at graft and radiates into fingers. No dyspnea, no tachypnea.

## 2021-08-30 NOTE — ED Provider Notes (Signed)
[facility] EMERGENCY DEPARTMENT Provider Note   CSN: [id number] Arrival date & time: 08/30/21  0715     History Chief Complaint  Patient presents with   Arm Swelling    Jeffrey [name] is a 60 y.o. male who presents to the emergency department for right arm swelling that has been constant and worsening over the last couple weeks.  He states that he had a AV graft placed couple weeks ago with the swelling starting shortly afterwards.  He rates his pain severe in severity.  His pain was slightly improved with Dilaudid given prior to arrival. Patient normally gets dialysis Monday, Wednesday, Friday and was unable to have dialysis today secondary to pain and came to the ED instead.   The history is provided by the patient. No language interpreter was used.      Past Medical History:  Diagnosis Date   Arthritis    Blood transfusion without reported diagnosis    CHF (congestive heart failure) (HCC)    COPD (chronic obstructive pulmonary disease) (HCC)    Diabetes mellitus without complication ([place])    Hypertension    PTSD (post-traumatic stress disorder)    Renal disorder    Stage 5 Kidney Failure    Patient Active Problem List   Diagnosis Date Noted   Dialysis AV fistula malfunction, initial encounter ([facility]) 08/30/2021   Hypoglycemia 08/04/2021   Sepsis ([place])    Severe anemia    Infection of prosthetic left knee joint ([place])    MRSA bacteremia 03/30/2021   Left knee pain    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    Fever 03/29/2021   ESRD (end stage renal disease) ([place]) 03/29/2021   COPD (chronic obstructive pulmonary disease) ([place]) 03/29/2021   Hypertension complicating diabetes ([place]) 03/29/2021   Type 2 diabetes mellitus with hyperlipidemia ([place]) 03/29/2021   PTSD (post-traumatic stress disorder) 03/29/2021   CHF (congestive heart failure) ([place]) 03/29/2021    Past Surgical History:  Procedure Laterality Date   AMPUTATION Left 04/03/2021    Procedure: LEFT ABOVE-THE-KNEE AMPUTATION;  Surgeon: Erle Crocker, MD;  Location: [facility];  Service: Orthopedics;  Laterality: Left;   AV FISTULA PLACEMENT Right 08/12/2021   Procedure: INSERTION OF ARTERIOVENOUS (AV) GORE-TEX GRAFT RIGHT ARM;  Surgeon: Serafina Mitchell, MD;  Location: [facility] OR;  Service: Vascular;  Laterality: Right;   IR REMOVAL TUN CV CATH W/O FL  03/30/2021   JOINT REPLACEMENT     Bilateral knees   UPPER EXTREMITY VENOGRAPHY Right 08/10/2021   Procedure: UPPER EXTREMITY VENOGRAPHY;  Surgeon: Serafina Mitchell, MD;  Location: [facility];  Service: Cardiovascular;  Laterality: Right;       No family history on file.  Social History   Tobacco Use   Smoking status: Former    Types: Cigarettes    Quit date: 2012    Years since quitting: 10.7   Smokeless tobacco: Never  Vaping Use   Vaping Use: Never used  Substance Use Topics   Alcohol use: Yes   Drug use: Not Currently    Comment: Years ago    Home Medications Prior to Admission medications   Medication Sig Start Date End Date Taking? Authorizing Provider  ACCU-CHEK GUIDE test strip 4 (four) times daily. 11/15/20   [provider]  acetaminophen (TYLENOL) 325 MG tablet Take 2 tablets (650 mg total) by mouth every 6 (six) hours as needed for moderate pain. 04/14/21   Raiford Noble Latif, DO  albuterol (PROVENTIL) (  2.5 MG/3ML) 0.083% nebulizer solution Inhale 2.5 mg into the lungs every 8 (eight) hours as needed for wheezing or shortness of breath.    [provider]  albuterol (VENTOLIN HFA) 108 (90 Base) MCG/ACT inhaler Inhale 1-2 puffs into the lungs every 6 (six) hours as needed for wheezing or shortness of breath.    [provider]  amLODipine (NORVASC) 5 MG tablet Take 5 mg by mouth at bedtime. 04/17/20   [provider]  ASPIRIN LOW DOSE 81 MG EC tablet Take 81 mg by mouth daily. 02/22/21   [provider]  atorvastatin (LIPITOR) 40 MG tablet Take 40 mg  by mouth daily. 02/09/21   [provider]  calcium acetate (PHOSLO) 667 MG capsule Take 2 capsules (1,334 mg total) by mouth with breakfast, with lunch, and with evening meal. 04/10/21   Debbe Odea, MD  carvedilol (COREG) 12.5 MG tablet Take 12.5 mg by mouth 2 (two) times daily with a meal. 04/17/20   [provider]  Cholecalciferol 50 MCG (2000 UT) CAPS Take 2,000 Units by mouth daily. 08/24/20   [provider]  Continuous Blood Gluc Receiver (DEXCOM G6 RECEIVER) DEVI Use 1 Device continuously 03/08/21   [provider]  Continuous Blood Gluc Sensor (DEXCOM G6 SENSOR) MISC Use 1 each every 10 (ten) days 03/08/21   [provider]  Continuous Blood Gluc Transmit (DEXCOM G6 TRANSMITTER) MISC Use 1 each every 3 (three) months 03/08/21   [provider]  Cysteamine Bitartrate (PROCYSBI) 300 MG PACK accucheck guide meter 03/09/20   [provider]  diazepam (VALIUM) 5 MG tablet Take 1 tablet (5 mg total) by mouth 2 (two) times daily as needed for anxiety. 08/17/21   Alma Friendly, MD  diphenhydrAMINE (BENADRYL) 25 MG tablet Take 50 mg by mouth every 6 (six) hours as needed for allergies.    [provider]  Emollient (OINTMENT BASE) OINT Apply 1 application topically daily as needed (dry skin). 06/23/21 06/23/22  [provider]  glucagon 1 MG injection Inject 1 mg into the muscle as needed (low blood sugar). 06/23/21   [provider]  glucose blood (PRECISION QID TEST) test strip 4 times daily accucheck guide meter 09/28/20   [provider]  HYDROmorphone (DILAUDID) 2 MG tablet Take 1 tablet (2 mg total) by mouth every 6 (six) hours as needed for moderate pain. 08/17/21   Alma Friendly, MD  insulin regular human CONCENTRATED (HUMULIN R U-500 KWIKPEN) 500 UNIT/ML KwikPen Inject 95 Units into the skin 2 (two) times daily with a meal. 08/17/21   Alma Friendly, MD  lidocaine (LIDODERM) 5 % Place 3  patches onto the skin daily. Remove & Discard patch within 12 hours or as directed by MD 08/17/21   Alma Friendly, MD  metolazone (ZAROXOLYN) 10 MG tablet Take 10 mg by mouth daily. 07/13/21   [provider]  nortriptyline (PAMELOR) 10 MG capsule Take 20 mg by mouth at bedtime. 10/23/19 02/22/22  [provider]  Nutritional Supplements (,FEEDING SUPPLEMENT, PROSOURCE PLUS) liquid Take 30 mLs by mouth 2 (two) times daily between meals. 04/11/21   Debbe Odea, MD  pantoprazole (PROTONIX) 20 MG tablet Take 20 mg by mouth daily. 02/09/21   [provider]  polyethylene glycol powder (GLYCOLAX/MIRALAX) 17 GM/SCOOP powder Take 17 g by mouth daily as needed for mild constipation. 08/24/20   [provider]  pregabalin (LYRICA) 25 MG capsule Take 1 capsule (25 mg total) by mouth  2 (two) times daily. 08/17/21   Alma Friendly, MD  senna-docusate (SENOKOT-S) 8.6-50 MG tablet Take 2 tablets by mouth 2 (two) times daily as needed for mild constipation. 10/23/19   [provider]  sertraline (ZOLOFT) 100 MG tablet Take 100 mg by mouth daily. 02/22/21   [provider]  torsemide (DEMADEX) 100 MG tablet Take 1 tablet (100 mg total) by mouth every Tuesday, Thursday, Saturday, and Sunday. 08/17/21   Alma Friendly, MD    Allergies    Bupropion, Dexmedetomidine, Ibuprofen, Pioglitazone, and Tomato  Review of Systems   Review of Systems  Physical Exam Updated Vital Signs BP 119/76   Pulse 84   Temp 98.4 F (36.9 C) (Oral)   Resp 15   SpO2 96%   Physical Exam  ED Results / Procedures / Treatments   Labs (all labs ordered are listed, but only abnormal results are displayed) Labs Reviewed  CBC WITH DIFFERENTIAL/PLATELET - Abnormal; Notable for the following components:      Result Value   RBC 3.23 (*)    Hemoglobin 9.0 (*)    HCT 29.1 (*)    RDW 16.2 (*)    Platelets 130 (*)    Abs Immature Granulocytes 0.09 (*)    All other  components within normal limits  COMPREHENSIVE METABOLIC PANEL - Abnormal; Notable for the following components:   Glucose, Bld 148 (*)    BUN 63 (*)    Creatinine, Ser 3.00 (*)    Calcium 8.6 (*)    Total Protein 6.3 (*)    Albumin 3.2 (*)    GFR, Estimated 23 (*)    All other components within normal limits  CBG MONITORING, ED - Abnormal; Notable for the following components:   Glucose-Capillary 57 (*)    All other components within normal limits  CBG MONITORING, ED - Abnormal; Notable for the following components:   Glucose-Capillary 125 (*)    All other components within normal limits  RESP PANEL BY RT-PCR (FLU A&B, COVID) [id number]    EKG None  Radiology VAS US DUPLEX DIALYSIS ACCESS (AVF, AVG)  Result Date: 08/30/2021 DIALYSIS ACCESS Patient Name:  Jeffrey [name]  Date of Exam:   08/30/2021 Medical Rec #: [id number]    Accession #:    [id number] Date of Birth: [birth year]    Patient Gender: M Patient Age:   37 years Exam Location:  [facility] Procedure:      VAS [place] (AVF, AVG) Referring Phys: Myna Bright --------------------------------------------------------------------------------  Reason for Exam: Pain and swelling s/p right arm brachial artery to axillary                  vein AVG placement on 08-12-2021. Access Site: Right Upper Extremity. Access Type: Upper arm loop AVG. History: History of access removal from left arm for steal. History of          non-functioning right upper arm loop graft performed at outside          facility. Limitations: Body habitus and patient pain tolerance. Performing Technologist: Darlin Coco RDMS, RVT  Examination Guidelines: A complete evaluation includes B-mode imaging, spectral Doppler, color Doppler, and power Doppler as needed of all accessible portions of each vessel. Unilateral testing is considered an integral part of a complete examination. Limited examinations for reoccurring indications may be performed as noted.   Findings:   +--------------------+----------+-----------------+--------+ AVG  PSV (cm/s)Flow Vol (mL/min)Describe +--------------------+----------+-----------------+--------+ Native artery inflow   269                              +--------------------+----------+-----------------+--------+ Arterial anastomosis   629                              +--------------------+----------+-----------------+--------+ Prox graft             534                              +--------------------+----------+-----------------+--------+ Mid graft              176                              +--------------------+----------+-----------------+--------+ Distal graft           399                              +--------------------+----------+-----------------+--------+ Venous outflow         270                              +--------------------+----------+-----------------+--------+  Summary: Patent AVG with low peak systolic velocities noted at the mid-graft without evidence of narrowing or obstruction.  *See table(s) above for measurements and observations.      --------------------------------------------------------------------------------   Preliminary     Procedures Procedures   Medications Ordered in ED Medications  fentaNYL (SUBLIMAZE) injection 50 mcg (50 mcg Intravenous Given 08/30/21 1216)    ED Course  I have reviewed the triage vital signs and the nursing notes.  Pertinent labs & imaging results that were available during my care of the patient were reviewed by me and considered in my medical decision making (see chart for details).  Clinical Course as of 08/30/21 1258  Mon Aug 30, 2021  1050 I discussed this case with my attending physician who cosigned this note including patient's presenting symptoms, physical exam, and planned diagnostics and interventions. Attending physician stated agreement with plan or made changes to plan which were implemented.     [CF]  1112 Spoke with Dr. Donzetta Matters with vascular surgery. He is going to come evaluate the patient in the ED.  [CF]  1224 Spoke with Dr. Donzetta Matters with vascular surgery again.  He is going to do a fistulogram later today with the patient.  I will admit him to the hospitalist service for further evaluation. [CF]  [id number] Spoke with Dr. Lorin Mercy who agrees to admit the patient to the hospitalist service.  [CF]    Clinical Course User Index [CF] Cherrie Gauze   MDM Rules/Calculators/A&P                          Jeffrey [name] is a 60 y.o. male who presents to the emergency department for further evaluation of right arm pain.  History and physical exam is less concerning for underlying infection.  The arm is not warm to palpation or indurated.  He has no systemic signs of infection at this time.  Vascular surgery recommended admission and will perform a fistulogram later today.  CBC is without  leukocytosis and chronic ongoing anemia. He is asymptomatic. CMP reveals elevated creatine in the setting of ESRD. Potassium is normal.   I will admit the patient to the hospitalist service for fistulogram later today.    Final Clinical Impression(s) / ED Diagnoses Final diagnoses:  Pain of right upper extremity    Rx / DC Orders ED Discharge Orders     None        Cherrie Gauze 08/30/21 1258    Truddie Hidden, MD 08/30/21 [phone]

## 2021-08-30 NOTE — Consult Note (Signed)
Hospital Consult    Reason for Consult: Right upper extremity swelling Referring Physician: Dr. Lorin Mercy MRN #:  [id number]  History of Present Illness: This is a 60 y.o. male history of end-stage renal disease currently dialyzing via left IJ catheter.  2 weeks ago had right upper arm AV graft placed.  Now states that he has had progressive swelling of his right arm was unable to use his wheelchair beginning yesterday.  Graft has not been used yet.  Does have previous difficulty with access in the left upper extremity.  He is on blood thinners.  He he did eat this morning upon arrival to the emergency department.  Past Medical History:  Diagnosis Date   Arthritis    Blood transfusion without reported diagnosis    CHF (congestive heart failure) (HCC)    COPD (chronic obstructive pulmonary disease) ([place])    Diabetes mellitus without complication ([place])    Hypertension    PTSD (post-traumatic stress disorder)    Renal disorder    Stage 5 Kidney Failure    Past Surgical History:  Procedure Laterality Date   AMPUTATION Left 04/03/2021   Procedure: LEFT ABOVE-THE-KNEE AMPUTATION;  Surgeon: Erle Crocker, MD;  Location: [facility];  Service: Orthopedics;  Laterality: Left;   AV FISTULA PLACEMENT Right 08/12/2021   Procedure: INSERTION OF ARTERIOVENOUS (AV) GORE-TEX GRAFT RIGHT ARM;  Surgeon: Serafina Mitchell, MD;  Location: [facility];  Service: Vascular;  Laterality: Right;   IR REMOVAL TUN CV CATH W/O FL  03/30/2021   JOINT REPLACEMENT     Bilateral knees   UPPER EXTREMITY VENOGRAPHY Right 08/10/2021   Procedure: UPPER EXTREMITY VENOGRAPHY;  Surgeon: Serafina Mitchell, MD;  Location: [facility];  Service: Cardiovascular;  Laterality: Right;    Allergies  Allergen Reactions   Bupropion Swelling   Dexmedetomidine Nausea And Vomiting    Other reaction(s): Other (See Comments) Dose-limiting bradycardia Dose-limiting bradycardia    Ibuprofen Shortness Of Breath and Swelling    Pt  tolerates aspirin Pt tolerates aspirin    Pioglitazone Anaphylaxis and Rash   Tomato Anaphylaxis    Only allergic to RAW tomatoes    Prior to Admission medications   Medication Sig Start Date End Date Taking? Authorizing Provider  ACCU-CHEK GUIDE test strip 4 (four) times daily. 11/15/20   [provider]  acetaminophen (TYLENOL) 325 MG tablet Take 2 tablets (650 mg total) by mouth every 6 (six) hours as needed for moderate pain. 04/14/21   Raiford Noble Latif, DO  albuterol (PROVENTIL) (2.5 MG/3ML) 0.083% nebulizer solution Inhale 2.5 mg into the lungs every 8 (eight) hours as needed for wheezing or shortness of breath.    [provider]  albuterol (VENTOLIN HFA) 108 (90 Base) MCG/ACT inhaler Inhale 1-2 puffs into the lungs every 6 (six) hours as needed for wheezing or shortness of breath.    [provider]  amLODipine (NORVASC) 5 MG tablet Take 5 mg by mouth at bedtime. 04/17/20   [provider]  ASPIRIN LOW DOSE 81 MG EC tablet Take 81 mg by mouth daily. 02/22/21   [provider]  atorvastatin (LIPITOR) 40 MG tablet Take 40 mg by mouth daily. 02/09/21   [provider]  calcium acetate (PHOSLO) 667 MG capsule Take 2 capsules (1,334 mg total) by mouth with breakfast, with lunch, and with evening meal. 04/10/21   Debbe Odea, MD  carvedilol (COREG) 12.5 MG tablet Take 12.5 mg by mouth 2 (two) times daily with a meal.  04/17/20   [provider]  Cholecalciferol 50 MCG (2000 UT) CAPS Take 2,000 Units by mouth daily. 08/24/20   [provider]  Continuous Blood Gluc Receiver (DEXCOM G6 RECEIVER) DEVI Use 1 Device continuously 03/08/21   [provider]  Continuous Blood Gluc Sensor (DEXCOM G6 SENSOR) MISC Use 1 each every 10 (ten) days 03/08/21   [provider]  Continuous Blood Gluc Transmit (DEXCOM G6 TRANSMITTER) MISC Use 1 each every 3 (three) months 03/08/21   [provider]  Cysteamine  Bitartrate (PROCYSBI) 300 MG PACK accucheck guide meter 03/09/20   [provider]  diazepam (VALIUM) 5 MG tablet Take 1 tablet (5 mg total) by mouth 2 (two) times daily as needed for anxiety. 08/17/21   Alma Friendly, MD  diphenhydrAMINE (BENADRYL) 25 MG tablet Take 50 mg by mouth every 6 (six) hours as needed for allergies.    [provider]  Emollient (OINTMENT BASE) OINT Apply 1 application topically daily as needed (dry skin). 06/23/21 06/23/22  [provider]  glucagon 1 MG injection Inject 1 mg into the muscle as needed (low blood sugar). 06/23/21   [provider]  glucose blood (PRECISION QID TEST) test strip 4 times daily accucheck guide meter 09/28/20   [provider]  HYDROmorphone (DILAUDID) 2 MG tablet Take 1 tablet (2 mg total) by mouth every 6 (six) hours as needed for moderate pain. 08/17/21   Alma Friendly, MD  insulin regular human CONCENTRATED (HUMULIN R U-500 KWIKPEN) 500 UNIT/ML KwikPen Inject 95 Units into the skin 2 (two) times daily with a meal. 08/17/21   Alma Friendly, MD  lidocaine (LIDODERM) 5 % Place 3 patches onto the skin daily. Remove & Discard patch within 12 hours or as directed by MD 08/17/21   Alma Friendly, MD  metolazone (ZAROXOLYN) 10 MG tablet Take 10 mg by mouth daily. 07/13/21   [provider]  nortriptyline (PAMELOR) 10 MG capsule Take 20 mg by mouth at bedtime. 10/23/19 02/22/22  [provider]  Nutritional Supplements (,FEEDING SUPPLEMENT, PROSOURCE PLUS) liquid Take 30 mLs by mouth 2 (two) times daily between meals. 04/11/21   Debbe Odea, MD  pantoprazole (PROTONIX) 20 MG tablet Take 20 mg by mouth daily. 02/09/21   [provider]  polyethylene glycol powder (GLYCOLAX/MIRALAX) 17 GM/SCOOP powder Take 17 g by mouth daily as needed for mild constipation. 08/24/20   [provider]  pregabalin (LYRICA) 25 MG capsule Take 1 capsule (25 mg total) by mouth 2  (two) times daily. 08/17/21   Alma Friendly, MD  senna-docusate (SENOKOT-S) 8.6-50 MG tablet Take 2 tablets by mouth 2 (two) times daily as needed for mild constipation. 10/23/19   [provider]  sertraline (ZOLOFT) 100 MG tablet Take 100 mg by mouth daily. 02/22/21   [provider]  torsemide (DEMADEX) 100 MG tablet Take 1 tablet (100 mg total) by mouth every Tuesday, Thursday, Saturday, and Sunday. 08/17/21   Alma Friendly, MD    Social History   Socioeconomic History   Marital status: Married    Spouse name: Not on file   Number of children: Not on file   Years of education: Not on file   Highest education level: Not on file  Occupational History   Occupation: disabled  Tobacco Use   Smoking status: Former    Types: Cigarettes    Quit date: 2012    Years since quitting: 10.7   Smokeless tobacco: Never  Vaping Use   Vaping Use: Never used  Substance and Sexual Activity   Alcohol use: Yes    Comment: occasionally   Drug use: Not Currently    Comment: Years ago   Sexual activity: Not on file  Other Topics Concern   Not on file  Social History Narrative   Not on file   Social Determinants of Health   Financial Resource Strain: Not on file  Food Insecurity: Not on file  Transportation Needs: Not on file  Physical Activity: Not on file  Stress: Not on file  Social Connections: Not on file  Intimate Partner Violence: Not on file     History reviewed. No pertinent family history.  ROS: Right upper extremity swelling  Physical Examination  Vitals:   08/30/21 1245 08/30/21 1300  BP: 119/76 129/74  Pulse: 84 78  Resp: 15 13  Temp:    SpO2: 96% 100%   There is no height or weight on file to calculate BMI.  Awake alert oriented None the respirations Nonpitting edema entire right upper extremity Pulsatility in right upper arm AV graft  CBC    Component Value Date/Time   WBC 6.2 08/30/2021 1045   RBC 3.23 (L) 08/30/2021 1045    HGB 9.0 (L) 08/30/2021 1045   HCT 29.1 (L) 08/30/2021 1045   PLT 130 (L) 08/30/2021 1045   MCV 90.1 08/30/2021 1045   MCH 27.9 08/30/2021 1045   MCHC 30.9 08/30/2021 1045   RDW 16.2 (H) 08/30/2021 1045   LYMPHSABS 0.9 08/30/2021 1045   MONOABS 0.5 08/30/2021 1045   EOSABS 0.2 08/30/2021 1045   BASOSABS 0.0 08/30/2021 1045    BMET    Component Value Date/Time   NA 137 08/30/2021 1045   K 4.0 08/30/2021 1045   CL 102 08/30/2021 1045   CO2 24 08/30/2021 1045   GLUCOSE 148 (H) 08/30/2021 1045   BUN 63 (H) 08/30/2021 1045   CREATININE 3.00 (H) 08/30/2021 1045   CALCIUM 8.6 (L) 08/30/2021 1045   GFRNONAA 23 (L) 08/30/2021 1045    COAGS: Lab Results  Component Value Date   INR 1.2 03/28/2021      Study Result  DIALYSIS ACCESS   Patient Name:  LUIGI LORMAN  Date of Exam:   08/30/2021  Medical Rec #: [id number]    Accession #:    [id number]  Date of Birth: 05-Aug-1961    Patient Gender: M  Patient Age:   50 years  Exam Location:  [facility]  Procedure:      VAS US DUPLEX DIALYSIS ACCESS (AVF, AVG)  Referring Phys: Myna Bright    ---------------------------------------------------------------------------  -----     Reason for Exam: Pain and swelling s/p right arm brachial artery to  axillary                   vein AVG placement on 08-12-2021.   Access Site: Right Upper Extremity.   Access Type: Upper arm loop AVG.   History: History of access removal from left arm for steal. History of           non-functioning right upper arm loop graft performed at outside           facility.   Limitations: Body habitus and patient pain tolerance.   Performing Technologist: Darlin Coco RDMS, RVT      Examination Guidelines: A complete evaluation includes B-mode imaging,  spectral  Doppler, color Doppler, and power Doppler as needed of all accessible  portions  of each vessel. Unilateral testing is considered an integral part of a  complete  examination.  Limited examinations for reoccurring indications may be  performed  as noted.      Findings:         +--------------------+----------+-----------------+--------+  AVG                 PSV (cm/s)Flow Vol (mL/min)Describe  +--------------------+----------+-----------------+--------+  Native artery inflow   269                               +--------------------+----------+-----------------+--------+  Arterial anastomosis   629                               +--------------------+----------+-----------------+--------+  Prox graft             534                               +--------------------+----------+-----------------+--------+  Mid graft              176                               +--------------------+----------+-----------------+--------+  Distal graft           399                               +--------------------+----------+-----------------+--------+  Venous outflow         270                               +--------------------+----------+-----------------+--------+       Summary:  Patent AVG with low peak systolic velocities noted at the mid-graft  without evidence of narrowing or obstruction.        ASSESSMENT/PLAN: This is a 60 y.o. male with end-stage renal disease currently dialyzing via catheter.  Right upper extremity graft recently placed now has extensive swelling right upper extremity.  There is pulsatility in the graft concerning for outflow obstruction.  We will plan for fistulogram today.  Hospitalist to admit.  Stylianos Stradling C. Donzetta Matters, MD [facility] Office: [phone] Pager: [phone]

## 2021-08-30 NOTE — ED Notes (Signed)
Patient alert and oriented but reports feeling like his blood sugar is low. Given 8 ounces orange juice and a [place] sandwich.

## 2021-08-30 NOTE — Progress Notes (Signed)
Pharmacy Home Medication Reconciliation Communication High Risk Medication: Humulin U-500 Insulin  Home dose of Humulin U-500 insulin was reordered for this patient. The dose was verified with the patient as listed below:  Type of syringe used at home:  [facility] Number or line on syringe to which patient draws up insulin: Dials pen to 95 units  This equates to 95 units of U-500 insulin  Sherlon Handing, PharmD, BCPS Please see amion for complete clinical pharmacist phone list 08/30/2021  10:04 PM

## 2021-08-30 NOTE — Op Note (Signed)
    Patient name: Jeffrey [name] MRN: [id number] DOB: [birth year] Sex: male  08/30/2021 Pre-operative Diagnosis: End-stage renal disease: Malfunction right upper arm AV graft Post-operative diagnosis:  Same Surgeon:  Eda Paschal. Donzetta Matters, MD Procedure Performed: 1.  Ultrasound-guided cannulation right arm AV graft 2.  Right upper extremity shuntogram 3.  Drug-coated balloon angioplasty right innominate vein with 12 mm Lutonics 4.  Moderate sedation with fentanyl and Versed for 41 minutes  Indications: 60 year old male with end-stage renal disease recently had right upper arm AV graft placed.  Now has swelling of the right arm he continues to dialyze via catheter.  He is indicated for fistulogram possible intervention.  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  This probably measured 70 to 80%.  At completion this was less than 20%.  There was improved thrill in the graft.   Procedure:  The patient was identified in the holding area and taken to room 8.  The patient was then placed supine on the table and prepped and draped in the usual sterile fashion.  A time out was called.  Ultrasound was used to evaluate the right arm AV graft.  There is no signs 1% lidocaine cannulated with a micropuncture needle followed by wire and sheath.  I extremity shuntogram was performed.  With the above findings we placed a Bentson wire followed by 7 [place] sheath.  We then primarily balloon angioplastied the lesion with 10 and 12 mm balloons.  We then used 12 mm drug-coated balloon and burst pressure for 3 minutes.  Completion demonstrated residual stenosis less than 20% with improved thrill in the upper arm graft.  We suture-ligated the cannulation site remove the sheath.  He tolerated procedure without any complication.  Contrast: 60cc  Jeffrey Santarelli C. Donzetta Matters, MD [facility] Office: [phone] Pager: [phone]

## 2021-08-30 NOTE — ED Provider Notes (Signed)
Emergency Medicine Provider Triage Evaluation Note  Jeffrey [name] , a 60 y.o. male  was evaluated in triage.  Pt complains of right arm swelling over the last couple weeks has been constant and worsening.  He states he had a graft placed in the right upper arm has been swollen since.  He denies any fevers, chills, chest pain, new shortness of breath from his baseline, swelling of the left arm or legs.  Review of Systems  Positive:  Negative:   Physical Exam  BP (!) 160/65 (BP Location: Left Arm)   Pulse 82   Temp 98.4 F (36.9 C) (Oral)   Resp 17   SpO2 100%  Gen:   Awake, no distress   Resp:  Normal effort  MSK:   Moves extremities without difficulty  Other:    Medical Decision Making  Medically screening exam initiated at 8:27 AM.  Appropriate orders placed.  Norvin Rabas was informed that the remainder of the evaluation will be completed by another provider, this initial triage assessment does not replace that evaluation, and the importance of remaining in the ED until their evaluation is complete.     Myna Bright [place], PA-C 08/30/21 [id number]    Truddie Hidden, MD 08/30/21 [phone]

## 2021-08-30 NOTE — Progress Notes (Signed)
Received a call Christine with [facility]. Pt is now on service with them and will follow up when there is a plan for discharge.

## 2021-08-30 NOTE — Progress Notes (Signed)
Aware that [name] has been admitted OBS status for RUE fistulogram with [facility] today.  Usual dialysis schedule is MWF at [facility]. He did miss his dialysis today, as well as on Friday. His last HD was 08/25/21.  Reviewed his labs and vitals which are stable.  I contacted his outpatient HD unit - they would be willing to run him tomorrow (Tues 10/4) with chair time of 10:30am.  If he can be discharged in time to make it to outpatient dialysis tomorrow, that would be ideal.  If not, we can certainly dialyze him here tomorrow.   Due to high HD census, he will not be able to be dialyzed today.  Veneta Penton, PA-C [employer] Pager [phone]

## 2021-08-30 NOTE — H&P (Signed)
History and Physical    Arizona Hashimoto [id number] DOB: [birth year] DOA: 08/30/2021  PCP: Merryl Hacker, No Consultants:  Violet Baldy - endocrinology; nephrology Patient coming from:  Home - lives with daughter "and that's nobody's business"; NOK: Wife, [phone]  Chief Complaint: RUE edema  HPI: Jeffrey [name] is a 60 y.o. 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   He had the graft placed during last admission and his arm is swollen and painful.  It has been swollen since placement and it has been slightly painful but now worse.  He missed HD last Friday because the "tech was real snotty and I wasn't in the mood for snotty."  He did have a full session last Wednesday.  He does acknowledge that his mood is short, needs his Zoloft.  Not feeling SOB.  He does have L hand swelling as well.      ED Course: Dr. Donzetta Matters will take for fistulogram later today requests [place] admission.  Review of Systems: As per HPI; otherwise review of systems reviewed and negative.   Ambulatory Status:  Uses a wheelchair, has not gotten his prosthetic yet  COVID Vaccine Status:  Complete plus 2 boosters  Past Medical History:  Diagnosis Date   Arthritis    Blood transfusion without reported diagnosis    CHF (congestive heart failure) (HCC)    COPD (chronic obstructive pulmonary disease) ([place])    Diabetes mellitus without complication ([place])    Hypertension    PTSD (post-traumatic stress disorder)    Renal disorder    Stage 5 Kidney Failure    Past Surgical History:  Procedure Laterality Date   AMPUTATION Left 04/03/2021   Procedure: LEFT ABOVE-THE-KNEE AMPUTATION;  Surgeon: Erle Crocker, MD;  Location: [facility];  Service: Orthopedics;  Laterality: Left;   AV FISTULA PLACEMENT Right 08/12/2021   Procedure: INSERTION OF ARTERIOVENOUS (AV) GORE-TEX GRAFT RIGHT ARM;  Surgeon: Serafina Mitchell, MD;  [facility];  Service: Vascular;  Laterality: Right;   IR REMOVAL TUN CV CATH W/O FL  03/30/2021   JOINT REPLACEMENT     Bilateral knees   UPPER EXTREMITY VENOGRAPHY Right 08/10/2021   Procedure: UPPER EXTREMITY VENOGRAPHY;  Surgeon: Serafina Mitchell, MD;  Location: [facility];  Service: Cardiovascular;  Laterality: Right;    Social History   Socioeconomic History   Marital status: Married    Spouse name: Not on file   Number of children: Not on file   Years of education: Not on file   Highest education level: Not on file  Occupational History   Occupation: disabled  Tobacco Use   Smoking status: Former    Types: Cigarettes    Quit date: 2012    Years since quitting: 10.7   Smokeless tobacco: Never  Vaping Use   Vaping Use: Never used  Substance and Sexual Activity   Alcohol use: Yes    Comment: occasionally   Drug use: Not Currently    Comment: Years ago   Sexual activity: Not on file  Other Topics Concern   Not on file  Social History Narrative   Not on file   Social Determinants of Health   Financial Resource Strain: Not on file  Food Insecurity: Not on file  Transportation Needs: Not on file  Physical Activity: Not on file  Stress: Not on file  Social Connections: Not on  file  Intimate Partner Violence: Not on file    Allergies  Allergen Reactions   Bupropion Swelling   Dexmedetomidine Nausea And Vomiting    Other reaction(s): Other (See Comments) Dose-limiting bradycardia Dose-limiting bradycardia    Ibuprofen Shortness Of Breath and Swelling    Pt tolerates aspirin Pt tolerates aspirin    Pioglitazone Anaphylaxis and Rash   Tomato Anaphylaxis    Only allergic to RAW tomatoes    History reviewed. No pertinent family history.  Prior to Admission medications   Medication Sig Start Date End Date Taking? Authorizing Provider  ACCU-CHEK GUIDE test strip 4 (four) times daily. 11/15/20   [provider]  acetaminophen (TYLENOL) 325 MG tablet  Take 2 tablets (650 mg total) by mouth every 6 (six) hours as needed for moderate pain. 04/14/21   Raiford Noble Latif, DO  albuterol (PROVENTIL) (2.5 MG/3ML) 0.083% nebulizer solution Inhale 2.5 mg into the lungs every 8 (eight) hours as needed for wheezing or shortness of breath.    [provider]  albuterol (VENTOLIN HFA) 108 (90 Base) MCG/ACT inhaler Inhale 1-2 puffs into the lungs every 6 (six) hours as needed for wheezing or shortness of breath.    [provider]  amLODipine (NORVASC) 5 MG tablet Take 5 mg by mouth at bedtime. 04/17/20   [provider]  ASPIRIN LOW DOSE 81 MG EC tablet Take 81 mg by mouth daily. 02/22/21   [provider]  atorvastatin (LIPITOR) 40 MG tablet Take 40 mg by mouth daily. 02/09/21   [provider]  calcium acetate (PHOSLO) 667 MG capsule Take 2 capsules (1,334 mg total) by mouth with breakfast, with lunch, and with evening meal. 04/10/21   Debbe Odea, MD  carvedilol (COREG) 12.5 MG tablet Take 12.5 mg by mouth 2 (two) times daily with a meal. 04/17/20   [provider]  Cholecalciferol 50 MCG (2000 UT) CAPS Take 2,000 Units by mouth daily. 08/24/20   [provider]  Continuous Blood Gluc Receiver (DEXCOM G6 RECEIVER) DEVI Use 1 Device continuously 03/08/21   [provider]  Continuous Blood Gluc Sensor (DEXCOM G6 SENSOR) MISC Use 1 each every 10 (ten) days 03/08/21   [provider]  Continuous Blood Gluc Transmit (DEXCOM G6 TRANSMITTER) MISC Use 1 each every 3 (three) months 03/08/21   [provider]  Cysteamine Bitartrate (PROCYSBI) 300 MG PACK accucheck guide meter 03/09/20   [provider]  diazepam (VALIUM) 5 MG tablet Take 1 tablet (5 mg total) by mouth 2 (two) times daily as needed for anxiety. 08/17/21   Alma Friendly, MD  diphenhydrAMINE (BENADRYL) 25 MG tablet Take 50 mg by mouth every 6 (six) hours as needed for allergies.    [provider]   Emollient (OINTMENT BASE) OINT Apply 1 application topically daily as needed (dry skin). 06/23/21 06/23/22  [provider]  glucagon 1 MG injection Inject 1 mg into the muscle as needed (low blood sugar). 06/23/21   [provider]  glucose blood (PRECISION QID TEST) test strip 4 times daily accucheck guide meter 09/28/20   [provider]  HYDROmorphone (DILAUDID) 2 MG tablet Take 1 tablet (2 mg total) by mouth every 6 (six) hours as needed for moderate pain. 08/17/21   Alma Friendly, MD  insulin regular human CONCENTRATED (HUMULIN R U-500 KWIKPEN) 500 UNIT/ML KwikPen Inject 95 Units into the skin 2 (two) times daily with a meal. 08/17/21   Alma Friendly, MD  lidocaine (LIDODERM)  5 % Place 3 patches onto the skin daily. Remove & Discard patch within 12 hours or as directed by MD 08/17/21   Alma Friendly, MD  metolazone (ZAROXOLYN) 10 MG tablet Take 10 mg by mouth daily. 07/13/21   [provider]  nortriptyline (PAMELOR) 10 MG capsule Take 20 mg by mouth at bedtime. 10/23/19 02/22/22  [provider]  Nutritional Supplements (,FEEDING SUPPLEMENT, PROSOURCE PLUS) liquid Take 30 mLs by mouth 2 (two) times daily between meals. 04/11/21   Debbe Odea, MD  pantoprazole (PROTONIX) 20 MG tablet Take 20 mg by mouth daily. 02/09/21   [provider]  polyethylene glycol powder (GLYCOLAX/MIRALAX) 17 GM/SCOOP powder Take 17 g by mouth daily as needed for mild constipation. 08/24/20   [provider]  pregabalin (LYRICA) 25 MG capsule Take 1 capsule (25 mg total) by mouth 2 (two) times daily. 08/17/21   Alma Friendly, MD  senna-docusate (SENOKOT-S) 8.6-50 MG tablet Take 2 tablets by mouth 2 (two) times daily as needed for mild constipation. 10/23/19   [provider]  sertraline (ZOLOFT) 100 MG tablet Take 100 mg by mouth daily. 02/22/21   [provider]  torsemide (DEMADEX) 100 MG tablet Take 1 tablet (100 mg  total) by mouth every Tuesday, Thursday, Saturday, and Sunday. 08/17/21   Alma Friendly, MD    Physical Exam: Vitals:   08/30/21 1710 08/30/21 1740 08/30/21 1810 08/30/21 1833  BP: (!) 126/33 (!) 117/44 (!) 108/41 (!) 119/58  Pulse: 95 83 88 89  Resp: '15 13 13 16  '$ Temp:    97.9 F (36.6 C)  TempSrc:    Oral  SpO2: 94% 95% 94% 96%  Height:    '5\' 5"'$  (1.651 m)     General:  Appears calm and comfortable and is in NAD; very cantankerous Eyes:  PERRL, EOMI, normal lids, iris ENT:  grossly normal hearing, lips & tongue, mmm Neck:  no LAD, masses or thyromegaly Cardiovascular:  RRR, no m/r/g. No LE edema.  Respiratory:   CTA bilaterally with no wheezes/rales/rhonchi.  Normal respiratory effort. Abdomen:  soft, NT, ND Skin:  no rash or induration seen on limited exam Musculoskeletal: RUE is taut, with distal edema; LUE with scant hand edema; LLE is s/p AKA with normal stump healing Psychiatric:  irritable mood and affect, speech fluent and appropriate, AOx3 Neurologic:  CN 2-12 grossly intact, moves all extremities in coordinated fashion    Radiological Exams on Admission: Independently reviewed - see discussion in A/P where applicable  PERIPHERAL VASCULAR CATHETERIZATION  Result Date: 08/30/2021 Images from the original result were not included. Patient name: Keshawn Dunaway MRN: [id number] DOB: [birth year] Sex: male 08/30/2021 Pre-operative Diagnosis: End-stage renal disease: Malfunction right upper arm AV graft Post-operative diagnosis:  Same Surgeon:  Eda Paschal. Donzetta Matters, MD Procedure Performed: 1.  Ultrasound-guided cannulation right arm AV graft 2.  Right upper extremity shuntogram 3.  Drug-coated balloon angioplasty right innominate vein with 12 mm Lutonics 4.  Moderate sedation with fentanyl and Versed for 41 minutes Indications: 60 year old male with end-stage renal disease recently had right upper arm AV graft placed.  Now has swelling of the right arm he continues to dialyze via  catheter.  He is indicated for fistulogram possible intervention. 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  This probably measured 70 to 80%.  At completion this was less than 20%.  There was improved thrill in the graft.  Procedure:  The patient was identified in the  holding area and taken to room 8.  The patient was then placed supine on the table and prepped and draped in the usual sterile fashion.  A time out was called.  Ultrasound was used to evaluate the right arm AV graft.  There is no signs 1% lidocaine cannulated with a micropuncture needle followed by wire and sheath.  I extremity shuntogram was performed.  With the above findings we placed a Bentson wire followed by 7 [place] sheath.  We then primarily balloon angioplastied the lesion with 10 and 12 mm balloons.  We then used 12 mm drug-coated balloon and burst pressure for 3 minutes.  Completion demonstrated residual stenosis less than 20% with improved thrill in the upper arm graft.  We suture-ligated the cannulation site remove the sheath.  He tolerated procedure without any complication. Contrast: 60cc Brandon C. Donzetta Matters, MD [facility] [place] Office: [phone] Pager: [phone]   VAS US DUPLEX DIALYSIS ACCESS (AVF, AVG)  Result Date: 08/30/2021 DIALYSIS ACCESS Patient Name:  ANIV GELL  Date of Exam:   08/30/2021 Medical Rec #: [id number]    Accession #:    [id number] Date of Birth: [birth year]    Patient Gender: M Patient Age:   69 years Exam Location:  [facility] Procedure:      VAS US DUPLEX DIALYSIS ACCESS (AVF, AVG) Referring Phys: Myna Bright --------------------------------------------------------------------------------  Reason for Exam: Pain and swelling s/p right arm brachial artery to axillary                  vein AVG placement on 08-12-2021. Access Site: Right Upper Extremity. Access Type: Upper arm loop AVG. History: History of access removal from left arm for  steal. History of          non-functioning right upper arm loop graft performed at outside          facility. Limitations: Body habitus and patient pain tolerance. Performing Technologist: Darlin Coco RDMS, RVT  Examination Guidelines: A complete evaluation includes B-mode imaging, spectral Doppler, color Doppler, and power Doppler as needed of all accessible portions of each vessel. Unilateral testing is considered an integral part of a complete examination. Limited examinations for reoccurring indications may be performed as noted.  Findings:   +--------------------+----------+-----------------+--------+ AVG                 PSV (cm/s)Flow Vol (mL/min)Describe +--------------------+----------+-----------------+--------+ Native artery inflow   269                              +--------------------+----------+-----------------+--------+ Arterial anastomosis   629                              +--------------------+----------+-----------------+--------+ Prox graft             534                              +--------------------+----------+-----------------+--------+ Mid graft              176                              +--------------------+----------+-----------------+--------+ Distal graft           399                              +--------------------+----------+-----------------+--------+  Venous outflow         270                              +--------------------+----------+-----------------+--------+  Summary: Patent AVG with low peak systolic velocities noted at the mid-graft without evidence of narrowing or obstruction.  *See table(s) above for measurements and observations.  Diagnosing physician: Servando Snare MD Electronically signed by Servando Snare MD on 08/30/2021 at 4:52:11 PM.   --------------------------------------------------------------------------------   Final     EKG: none   Labs on Admission: I have personally reviewed the available labs and imaging  studies at the time of the admission.  Pertinent labs:   Glucose 148, 125, 63, 115 BN 63/Creatinine 3.00/GFR 23 Albumin 3.2 WBC 6.2 Hgb 9.0 Platelets 130 COVID/flu negative   Assessment/Plan Principal Problem:   Dialysis AV fistula malfunction, initial encounter (HCC) Active Problems:   ESRD (end stage renal disease) (HCC)   COPD (chronic obstructive pulmonary disease) (HCC)   Hypertension complicating diabetes (HCC)   Type 2 diabetes mellitus with hyperlipidemia (HCC)   Mood disorder (HCC)   RUE edema, AV graft malfunction -Patient presenting with concern for recently placed RUE AV graft  -He went to the cath lab and a tight stenosis was noted -He had angioplasty -Will monitor overnight on med surg  ESRD on HD -Patient on chronic MWF HD -Nephrology prn order set utilized -He does not appear to be volume overloaded or otherwise in need of acute HD - but last had HD 5 days ago -Nephrology notified that patient will need HD - it is possible that he can be dialyzed tomorrow as an outpatient if he can be there at 1030 -Has L TDC in place for HD for now -Continue Phoslo -Hold metolazone, toresmide  HTN -Continue Norvasc, Coreg  DM -Will check A1c -May utilize CGM -Continue U500 -Cover with moderate-scale SSI   Mood d/o -Very irritable/cantankerous at the time of evaluation -For example, he was adamant about NOT being on a renal diet and very frustrated that I was trying to explain why this would be beneficial for him -Continue Zoloft, nortriptyline  COPD -Continue Albuterol if needed -He does not wear home O2  S/p L AKA -h/o MRSA -Continue ASA, Lyrica -He reports having significant phantom pain  HLD -Continue Lipitor    Note: This patient has been tested and is negative for the novel coronavirus COVID-19. The patient has been fully vaccinated against COVID-19.   Level of care: Med-Surg DVT prophylaxis: Heparin Code Status:  Full - confirmed with  patient Family Communication: None present Disposition Plan:  The patient is from: home  Anticipated d/c is to: home without [facility] services   Anticipated d/c date will depend on clinical response to treatment, but possibly as early as tomorrow if she has excellent response to treatment  Patient is currently: acutely ill Consults called: Vascular surgery; nephrology  Admission status:  It is my clinical opinion that referral for OBSERVATION is reasonable and necessary in this patient based on the above information provided. The aforementioned taken together are felt to place the patient at high risk for further clinical deterioration. However it is anticipated that the patient may be medically stable for discharge from the hospital within 24 to 48 hours.    Karmen Bongo MD [facility]   How to contact the [facility] Attending or Consulting provider [place] or covering provider during after hours [place], for this  patient?  Check the care team in [facility] and look for a) attending/consulting TRH provider listed and b) the [facility] team listed Log into [email or web address] and use [place]'s universal password to access. If you do not have the password, please contact the hospital operator. Locate the [facility] provider you are looking for under [facility] and page to a number that you can be directly reached. If you still have difficulty reaching the provider, please page the [facility] (Director on Call) for the Hospitalists listed on amion for assistance.   08/30/2021, 6:36 PM

## 2021-08-30 NOTE — ED Notes (Signed)
Pt SpO2 momentarily decreases to 78% on room air while sleeping. Pt states he has sleep apnea. Pt placed on 2L via [place]. 96% on 2L at this time.

## 2021-08-30 NOTE — Progress Notes (Signed)
Upper extremity venous dialysis access study completed.   Please see CV Proc for preliminary results.   Darlin Coco, RDMS, RVT

## 2021-08-30 NOTE — Progress Notes (Signed)
Dr. Saunders Revel paged to order pain medication for rt arm. Eating [place] sandwich; will recheck CBG

## 2021-08-31 ENCOUNTER — Encounter (HOSPITAL_COMMUNITY): Payer: Self-pay | Admitting: Vascular Surgery

## 2021-08-31 DIAGNOSIS — T82590A Other mechanical complication of surgically created arteriovenous fistula, initial encounter: Secondary | ICD-10-CM | POA: Diagnosis not present

## 2021-08-31 LAB — CBC
HCT: 27.2 % — ABNORMAL LOW (ref 39.0–52.0)
Hemoglobin: 8.5 g/dL — ABNORMAL LOW (ref 13.0–17.0)
MCH: 27.5 pg (ref 26.0–34.0)
MCHC: 31.3 g/dL (ref 30.0–36.0)
MCV: 88 fL (ref 80.0–100.0)
Platelets: 125 10*3/uL — ABNORMAL LOW (ref 150–400)
RBC: 3.09 MIL/uL — ABNORMAL LOW (ref 4.22–5.81)
RDW: 16.5 % — ABNORMAL HIGH (ref 11.5–15.5)
WBC: 6.3 10*3/uL (ref 4.0–10.5)
nRBC: 0 % (ref 0.0–0.2)

## 2021-08-31 LAB — BASIC METABOLIC PANEL
Anion gap: 10 (ref 5–15)
BUN: 61 mg/dL — ABNORMAL HIGH (ref 8–23)
CO2: 24 mmol/L (ref 22–32)
Calcium: 8.6 mg/dL — ABNORMAL LOW (ref 8.9–10.3)
Chloride: 102 mmol/L (ref 98–111)
Creatinine, Ser: 2.82 mg/dL — ABNORMAL HIGH (ref 0.61–1.24)
GFR, Estimated: 25 mL/min — ABNORMAL LOW (ref >60)
Glucose, Bld: 375 mg/dL — ABNORMAL HIGH (ref 70–99)
Potassium: 4.5 mmol/L (ref 3.5–5.1)
Sodium: 136 mmol/L (ref 135–145)

## 2021-08-31 LAB — GLUCOSE, CAPILLARY
Glucose-Capillary: 373 mg/dL — ABNORMAL HIGH (ref 70–99)
Glucose-Capillary: 326 mg/dL — ABNORMAL HIGH (ref 70–99)
Glucose-Capillary: 264 mg/dL — ABNORMAL HIGH (ref 70–99)
Glucose-Capillary: 97 mg/dL (ref 70–99)
Glucose-Capillary: 80 mg/dL (ref 70–99)
Glucose-Capillary: 89 mg/dL (ref 70–99)

## 2021-08-31 LAB — HEPATITIS B SURFACE ANTIBODY,QUALITATIVE: Hep B S Ab: REACTIVE — AB

## 2021-08-31 LAB — HEPATITIS B SURFACE ANTIGEN: Hepatitis B Surface Ag: NONREACTIVE

## 2021-08-31 MED ORDER — CHLORHEXIDINE GLUCONATE CLOTH 2 % EX PADS
6.0000 | MEDICATED_PAD | Freq: Every day | CUTANEOUS | Status: DC
  Administered 2021-08-31 – 2021-09-04 (×5): 6 via TOPICAL

## 2021-08-31 MED ORDER — INSULIN REGULAR HUMAN (CONC) 500 UNIT/ML ~~LOC~~ SOPN
95.0000 [IU] | PEN_INJECTOR | Freq: Every day | SUBCUTANEOUS | Status: DC
  Filled 2021-08-31: qty 3

## 2021-08-31 MED ORDER — OXYCODONE-ACETAMINOPHEN 5-325 MG PO TABS: 1.0000 | ORAL_TABLET | Freq: Four times a day (QID) | ORAL | 0 refills | Status: DC | PRN

## 2021-08-31 MED ORDER — HUMULIN R U-500 KWIKPEN 500 UNIT/ML ~~LOC~~ SOPN: 80.0000 [IU] | PEN_INJECTOR | Freq: Two times a day (BID) | SUBCUTANEOUS | Status: DC

## 2021-08-31 MED ORDER — INSULIN REGULAR HUMAN (CONC) 500 UNIT/ML ~~LOC~~ SOPN
45.0000 [IU] | PEN_INJECTOR | Freq: Two times a day (BID) | SUBCUTANEOUS | Status: DC
  Administered 2021-08-31: 45 [IU] via SUBCUTANEOUS

## 2021-08-31 MED ORDER — INSULIN REGULAR HUMAN (CONC) 500 UNIT/ML ~~LOC~~ SOPN
45.0000 [IU] | PEN_INJECTOR | Freq: Every day | SUBCUTANEOUS | Status: DC
  Administered 2021-09-01 – 2021-09-04 (×4): 45 [IU] via SUBCUTANEOUS
  Filled 2021-08-31: qty 3

## 2021-08-31 MED ORDER — INSULIN ASPART 100 UNIT/ML IJ SOLN
10.0000 [IU] | Freq: Once | INTRAMUSCULAR | Status: AC
  Administered 2021-08-31: 10 [IU] via SUBCUTANEOUS

## 2021-08-31 MED ORDER — HYDROMORPHONE HCL 1 MG/ML IJ SOLN
1.0000 mg | INTRAMUSCULAR | Status: DC | PRN
  Administered 2021-08-31 – 2021-09-05 (×28): 1 mg via INTRAVENOUS
  Filled 2021-08-31 (×28): qty 1

## 2021-08-31 NOTE — Progress Notes (Signed)
OT Cancellation Note  Patient Details Name: Channin Lennartz MRN: [id number] DOB: [birth year]   Cancelled Treatment:    Reason Eval/Treat Not Completed: Patient at procedure or test/ unavailable. Off floor for dialysis. OT to check back as time allows.   Gloris Manchester OTR/L Supplemental OT, Department of rehab services [phone]  Gamaliel Charney R H. 08/31/2021, 12:29 PM

## 2021-08-31 NOTE — Progress Notes (Addendum)
  Progress Note    08/31/2021 7:29 AM 1 Day Post-Op  Subjective:  subjectively no change in R arm edema overnight   Vitals:   08/30/21 2055 08/31/21 0347  BP: (!) 128/53 134/64  Pulse: 87 88  Resp: 18 18  Temp: 98.4 F (36.9 C) 98.4 F (36.9 C)  SpO2: 98% 96%   Physical Exam: Lungs:  non labored Incisions:  R arm fistulogram access site without hematoma Extremities:  R hand well perfused; palpable thrill R AVG Neurologic: A&O  CBC    Component Value Date/Time   WBC 6.3 08/31/2021 0353   RBC 3.09 (L) 08/31/2021 0353   HGB 8.5 (L) 08/31/2021 0353   HCT 27.2 (L) 08/31/2021 0353   PLT 125 (L) 08/31/2021 0353   MCV 88.0 08/31/2021 0353   MCH 27.5 08/31/2021 0353   MCHC 31.3 08/31/2021 0353   RDW 16.5 (H) 08/31/2021 0353   LYMPHSABS 0.9 08/30/2021 1045   MONOABS 0.5 08/30/2021 1045   EOSABS 0.2 08/30/2021 1045   BASOSABS 0.0 08/30/2021 1045    BMET    Component Value Date/Time   NA 136 08/31/2021 0353   K 4.5 08/31/2021 0353   CL 102 08/31/2021 0353   CO2 24 08/31/2021 0353   GLUCOSE 375 (H) 08/31/2021 0353   BUN 61 (H) 08/31/2021 0353   CREATININE 2.82 (H) 08/31/2021 0353   CALCIUM 8.6 (L) 08/31/2021 0353   GFRNONAA 25 (L) 08/31/2021 0353    INR    Component Value Date/Time   INR 1.2 03/28/2021 2149     Intake/Output Summary (Last 24 hours) at 08/31/2021 0729 Last data filed at 08/31/2021 0345 Gross per 24 hour  Intake --  Output 730 ml  Net -730 ml     Assessment/Plan:  60 y.o. male is s/p R innominate vein angioplasty 1 Day Post-Op   Patent R arm AVG with palpable thrill after outflow angioplasty R arm needs to be wrapped with ACE from hand to shoulder; also encouraged patient to periodically elevate the arm and exercise the hand during the day Continue HD via [facility] for now   Dagoberto Ligas, PA-C [facility] [phone] 08/31/2021 7:29 AM  I have independently interviewed and examined patient and agree with PA  assessment and plan above.   Belvie Iribe C. Donzetta Matters, MD [facility] [place] Office: [phone] Pager: [phone]

## 2021-08-31 NOTE — TOC Progression Note (Addendum)
Transition of Care [facility]) - Progression Note    Patient Details  Name: Jeffrey [name] MRN: [id number] Date of Birth: 1961-02-22  Transition of [facility]) CM/SW [place], [place] Phone Number: 08/31/2021, 4:45 PM  Clinical Narrative:     Update- CSW started insurance authorization for patient. [place] ID# [id number]. Insurance authorization pending.   CSW still awaiting PT eval for patient.CSW called Clarene Critchley with [facility] who confirmed with it being this late in the day and still awaiting to start insurance auth they are unable to accept patient for dc today. [facility] confirmed they can accept patient for dc in the morning. CSW will start insurance authorization once PT eval complete. MD informed.CSW will continue to follow and assist with dc planning needs.  Expected Discharge Plan: [place] Barriers to Discharge: No Barriers Identified  Expected Discharge Plan and Services Expected Discharge Plan: [place] In-house Referral: Clinical Social Work     Living arrangements for the past 2 months: [place] Expected Discharge Date: 08/31/21                                     Social Determinants of Health (SDOH) Interventions    Readmission Risk Interventions Readmission Risk Prevention Plan 04/07/2021  Transportation Screening Complete  Medication Review Press photographer) Referral to Pharmacy  PCP or Specialist appointment within 3-5 days of discharge Complete  HRI or Home Care Consult Complete  SW Recovery Care/Counseling Consult Complete  Palliative Care Screening Not [place] Complete

## 2021-08-31 NOTE — TOC Initial Note (Signed)
Transition of Care [facility] - Initial/Assessment Note    Patient Details  Name: Jeffrey [name] MRN: [id number] Date of Birth: 05-24-1961  Transition of Care [facility]) CM/SW Contact:    Milas Gain, [place] Phone Number: 08/31/2021, 11:41 AM  Clinical Narrative:                    CSW spoke with patient who confirmed he came from [facility] short term. Patients plan is to return for short term rehab. CSW awaiting PT/OT eval. CSW will continue to follow and assist with dc planning needs.  Expected Discharge Plan: Skilled Nursing Facility Barriers to Discharge: No Barriers Identified   Patient Goals and CMS Choice Patient states their goals for this hospitalization and ongoing recovery are:: to go to SNF CMS Medicare.gov Compare Post Acute Care list provided to:: Patient Choice offered to / list presented to : Patient  Expected Discharge Plan and Services Expected Discharge Plan: [place] In-house Referral: Clinical Social Work     Living arrangements for the past 2 months: [place] Expected Discharge Date: 08/31/21                                    Prior Living Arrangements/Services Living arrangements for the past 2 months: [place] Lives with:: Other (Comment) (came from SNF short term) Patient language and need for interpreter reviewed:: Yes Do you feel safe going back to the place where you live?: No   SNF  Need for Family Participation in Patient Care: Yes (Comment) Care giver support system in place?: Yes (comment)   Criminal Activity/Legal Involvement Pertinent to Current Situation/Hospitalization: No - Comment as needed  Activities of Daily Living      Permission Sought/Granted Permission sought to share information with : Case Manager, Family Supports, Customer service manager Permission granted to share information with : Yes, Verbal Permission Granted  Share Information with NAME:  Rod Holler  Permission granted to share info w AGENCY: SNF  Permission granted to share info w Relationship: spouse  Permission granted to share info w Contact Information: Rod Holler [phone]  Emotional Assessment   Attitude/Demeanor/Rapport: Gracious Affect (typically observed): Calm Orientation: : Oriented to Self, Oriented to Place, Oriented to  Time, Oriented to Situation Alcohol / Substance Use: Not Applicable Psych Involvement: No (comment)  Admission diagnosis:  Dialysis AV fistula malfunction, initial encounter ([place]) [T82.590A] Pain of right upper extremity [M79.601] Patient Active Problem List   Diagnosis Date Noted   Dialysis AV fistula malfunction, initial encounter ([place]) 08/30/2021   Mood disorder ([place]) 08/30/2021   Hypoglycemia 08/04/2021   Sepsis ([place])    Severe anemia    Infection of prosthetic left knee joint ([place])    MRSA bacteremia 03/30/2021   Left knee pain    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    Fever 03/29/2021   ESRD (end stage renal disease) ([place]) 03/29/2021   COPD (chronic obstructive pulmonary disease) ([place]) 03/29/2021   Hypertension complicating diabetes ([place]) 03/29/2021   Type 2 diabetes mellitus with hyperlipidemia ([place]) 03/29/2021   PTSD (post-traumatic stress disorder) 03/29/2021   CHF (congestive heart failure) ([place]) 03/29/2021   PCP:  Merryl Hacker, No Pharmacy:   [facility] [place], [place] AT [facility] [address] [address] Phone: [phone] Fax: [phone]     Social Determinants of Health (SDOH)  Interventions    Readmission Risk Interventions Readmission Risk Prevention Plan 04/07/2021  Transportation Screening Complete  Medication Review Press photographer) Referral to Pharmacy  PCP or Specialist appointment within 3-5 days of discharge Complete  HRI or Home Care Consult Complete  SW Recovery Care/Counseling Consult Complete  [place] Complete

## 2021-08-31 NOTE — Progress Notes (Signed)
OT Cancellation Note  Patient Details Name: Jeffrey [name] MRN: [id number] DOB: 26-Jul-1961   Cancelled Treatment:    Reason Eval/Treat Not Completed: Other (comment) Spoke to SW. Will defer OT needs to SNF.  Daisia Slomski,HILLARY 08/31/2021, 1:52 PM Maurie Boettcher, OT/L   Acute OT Clinical Specialist Acute Rehabilitation Services Pager [phone] Office [phone]

## 2021-08-31 NOTE — Discharge Summary (Addendum)
Physician Discharge Summary  Jeffrey [name] [id number] DOB: 1961/06/08 DOA: 08/30/2021  PCP: Pcp, No  Admit date: 08/30/2021 Discharge date: 09/05/2021  Time spent: 58mnutes  Recommendations for Outpatient Follow-up:  Follow-up with vascular surgery Dr. CDonzetta Mattersin 2 weeks PCP in 1 week,  CAUTION-he's on a complex regimen of U500 Insulin which is different on Dialysis vs Nondialysis days   Discharge Diagnoses:  Principal Problem:   Dialysis AV fistula malfunction, initial encounter ([facility] Active Problems:   ESRD (end stage renal disease) ([facility]   COPD (chronic obstructive pulmonary disease) ([facility]   Hypertension complicating diabetes ([facility]   Type 2 diabetes mellitus with hyperlipidemia ([facility]   Mood disorder ([facility] Morbid obesity  Discharge Condition: Stable  Diet recommendation: Renal, diabetic  Filed Weights   08/31/21 1215 08/31/21 1537  Weight: 133.1 kg 131.2 kg    History of present illness:  Jeffrey [name] a 60y.o. 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   He had the graft placed during last admission and his arm is swollen and painful.  It has been swollen since placement and it has been slightly painful but now worse.  He missed HD last Friday because the "tech was real snotty and I wasn't in the mood for snotty."  He did have a full session last Wednesday.  He does acknowledge that his mood is short, needs his Zoloft.  Not feeling SOB.  He does have L hand swelling as well.     Hospital Course:   RUE edema, AV graft malfunction -Patient presenting with pain and swelling at the site of recently placed right upper extremity AV graft  -Seen by vascular surgery Dr. CDonzetta Matters underwent shuntogram followed by balloon angioplasty of right innominate vein -Now stable to be discharged, continue oxycodone for few days -[facility]  [facility]   ESRD on HD -Patient on chronic MWF HD -Missed HD on Monday and last Friday however did not appear to be significantly volume overloaded  -Access issues as noted above, has a left TDC in place for HD  -Patient to receive hemodialysis inpatient today and then resume per usual regimen  -On metolazone and torsemide at baseline, this was resumed at discharge    HTN -Continue Norvasc, Coreg   DM -CBGs poorly controlled, continue U500, [facility] regimen of Insulin that's different on Dialysis and Non dialysis days, resumed   Mood d/o Depression -Continue Zoloft, nortriptyline   COPD -Continue Albuterol if needed -He does not wear home O2   S/p L AKA -h/o MRSA -Continue ASA, Lyrica -He reports having significant phantom pain   HLD -Continue Lipitor    Procedure -Pre-operative Diagnosis: End-stage renal disease: Malfunction right upper arm AV graft Post-operative diagnosis:  Same Surgeon:  BEda Paschal CDonzetta Matters MD Procedure Performed: 1.  Ultrasound-guided cannulation right arm AV graft 2.  Right upper extremity shuntogram 3.  Drug-coated balloon angioplasty right innominate vein with 12 mm Lutonics 4.  Moderate sedation with fentanyl and Versed for 41 minutes    Discharge Exam: Vitals:   08/31/21 1537 08/31/21 1751  BP: (!) 146/61 (!) 122/57  Pulse: 78 75  Resp: 18 19  Temp: 98.5 F (36.9 C)   SpO2: 96% 96%    General: AAOx3 Cardiovascular: S1S2/RRR Respiratory: CTAB Abdomen: Obese, soft, nontender Extremities:  Right arm surgical site with mild swelling, very high left AKA  Discharge Instructions    Allergies as of 08/31/2021       Reactions   Bupropion Swelling   Dexmedetomidine Nausea And Vomiting   Other reaction(s): Other (See Comments) Dose-limiting bradycardia Dose-limiting bradycardia   Ibuprofen Shortness Of Breath, Swelling   Pt tolerates aspirin Pt tolerates aspirin   Pioglitazone Anaphylaxis, Rash    Tomato Anaphylaxis   Only allergic to RAW tomatoes        Medication List     TAKE these medications    (feeding supplement) PROSource Plus liquid Take 30 mLs by mouth 2 (two) times daily between meals.   acetaminophen 325 MG tablet Commonly known as: TYLENOL Take 2 tablets (650 mg total) by mouth every 6 (six) hours as needed for moderate pain.   albuterol (2.5 MG/3ML) 0.083% nebulizer solution Commonly known as: PROVENTIL Inhale 2.5 mg into the lungs every 8 (eight) hours as needed for wheezing or shortness of breath.   albuterol 108 (90 Base) MCG/ACT inhaler Commonly known as: VENTOLIN HFA Inhale 1-2 puffs into the lungs every 6 (six) hours as needed for wheezing or shortness of breath.   amLODipine 5 MG tablet Commonly known as: NORVASC Take 5 mg by mouth at bedtime.   Aspirin Low Dose 81 MG EC tablet Generic drug: aspirin Take 81 mg by mouth daily.   atorvastatin 40 MG tablet Commonly known as: LIPITOR Take 40 mg by mouth daily.   calcium acetate 667 MG capsule Commonly known as: PHOSLO Take 2 capsules (1,334 mg total) by mouth with breakfast, with lunch, and with evening meal.   carvedilol 12.5 MG tablet Commonly known as: COREG Take 12.5 mg by mouth 2 (two) times daily with a meal.   Cholecalciferol 50 MCG (2000 UT) Caps Take 2,000 Units by mouth daily.   Dexcom G6 Receiver Devi Use 1 Device continuously   Dexcom G6 Sensor Misc Use 1 each every 10 (ten) days   Dexcom G6 Transmitter Misc Use 1 each every 3 (three) months   diphenhydrAMINE 25 MG tablet Commonly known as: BENADRYL Take 50 mg by mouth every 6 (six) hours as needed for allergies.   glucagon 1 MG injection Inject 1 mg into the muscle as needed (low blood sugar).   HumuLIN R U-500 KwikPen 500 UNIT/ML KwikPen Generic drug: insulin regular human CONCENTRATED Inject 80-140 Units into the skin 2 (two) times daily with a meal. For dialysis days give 80 units in am, if tray comes early  to go to dialysis early may given medication 1-2 hours earlier,  . Note this insulin is 5 times as concentrated as regular insulin U-100. Verify dose and volume prior to administration. 0  -Give 140Units in pm  CONCENTRATED insulin REGULAR (HUMULIN R U-500 "CONCENTRATED") 500 unit/mL injection 140 Units, Subcutaneous, User specified (Once per day on Mon Wed Fri Sun).  Instructions:   Non-Dialysis days give 140 units in the am - If NPO, hold dose. - If BG < 70, treat per hypoglycemia protocol. - If BG < 70 or any other dosing concerns, page MD. Note this insulin is 5 times as concentrated as regular insulin U-100. Verify dose and volume prior to administration. 0, give 140Units Qpm What changed:  how much to take additional instructions   lidocaine 5 % Commonly known as: LIDODERM Place 3 patches onto the skin daily. Remove & Discard patch within 12 hours or as directed by MD   metolazone 10 MG  tablet Commonly known as: ZAROXOLYN Take 10 mg by mouth daily.   nortriptyline 10 MG capsule Commonly known as: PAMELOR Take 20 mg by mouth at bedtime.   Ointment Base Oint Apply 1 application topically daily as needed (dry skin).   oxyCODONE-acetaminophen 5-325 MG tablet Commonly known as: PERCOCET/ROXICET Take 1 tablet by mouth every 6 (six) hours as needed for moderate pain or severe pain.   pantoprazole 20 MG tablet Commonly known as: PROTONIX Take 20 mg by mouth daily.   polyethylene glycol powder 17 GM/SCOOP powder Commonly known as: GLYCOLAX/MIRALAX Take 17 g by mouth daily as needed for mild constipation.   Precision QID Test test strip Generic drug: glucose blood 4 times daily accucheck guide meter   Accu-Chek Guide test strip Generic drug: glucose blood 4 (four) times daily.   pregabalin 25 MG capsule Commonly known as: LYRICA Take 1 capsule (25 mg total) by mouth 2 (two) times daily.   Procysbi 300 MG Pack Generic drug: Cysteamine Bitartrate accucheck guide meter    senna-docusate 8.6-50 MG tablet Commonly known as: Senokot-S Take 2 tablets by mouth 2 (two) times daily as needed for mild constipation.   sertraline 100 MG tablet Commonly known as: ZOLOFT Take 100 mg by mouth daily.   torsemide 100 MG tablet Commonly known as: DEMADEX Take 1 tablet (100 mg total) by mouth every Tuesday, Thursday, Saturday, and Sunday.       Allergies  Allergen Reactions   Bupropion Swelling   Dexmedetomidine Nausea And Vomiting    Other reaction(s): Other (See Comments) Dose-limiting bradycardia Dose-limiting bradycardia    Ibuprofen Shortness Of Breath and Swelling    Pt tolerates aspirin Pt tolerates aspirin    Pioglitazone Anaphylaxis and Rash   Tomato Anaphylaxis    Only allergic to RAW tomatoes      The results of significant diagnostics from this hospitalization (including imaging, microbiology, ancillary and laboratory) are listed below for reference.    Significant Diagnostic Studies: DG Ankle Complete Right  Result Date: 08/03/2021 CLINICAL DATA:  Right ankle pain EXAM: RIGHT ANKLE - COMPLETE 3+ VIEW COMPARISON:  None. FINDINGS: There is mild widening of the lateral tibiotalar joint space which may reflect the sequela of a ligamentous injury involving the lateral ligaments. There is no associated fracture or frank dislocation. No ankle effusion. Tiny plantar calcaneal spur. Mild bimalleolar soft tissue swelling. IMPRESSION: Mild widening of the lateral joint space which may reflect the sequela of ligamentous instability or injury. Mild bimalleolar soft tissue swelling. Electronically Signed   By: Fidela Salisbury M.D.   On: 08/03/2021 23:03   CT HEAD WO CONTRAST (5MM)  Result Date: 08/13/2021 CLINICAL DATA:  Dizziness with nonspecific unilateral headache. EXAM: CT HEAD WITHOUT CONTRAST TECHNIQUE: Contiguous axial images were obtained from the base of the skull through the vertex without intravenous contrast. COMPARISON:  MRI brain 04/02/2021  FINDINGS: Brain: Mild diffuse cerebral atrophy. Mild ventricular dilatation likely central atrophy. Patchy low-attenuation change in the deep white matter suggesting small vessel ischemia. No mass-effect or midline shift. No abnormal extra-axial fluid collections. Gray-white matter junctions are distinct. Basal cisterns are not effaced. No acute intracranial hemorrhage. Vascular: Mild intracranial vascular calcifications. Skull: Calvarium appears intact. Sinuses/Orbits: Retention cysts in the paranasal sinuses. No acute air-fluid levels. Mastoid air cells are clear. Other: None. IMPRESSION: No acute intracranial abnormalities. Mild chronic atrophy and small vessel ischemic changes. Electronically Signed   By: Lucienne Capers M.D.   On: 08/13/2021 19:41   MR LUMBAR SPINE WO CONTRAST  Result Date: 08/09/2021 CLINICAL DATA:  Lumbar radiculopathy EXAM: MRI LUMBAR SPINE WITHOUT CONTRAST TECHNIQUE: Multiplanar, multisequence MR imaging of the lumbar spine was performed. No intravenous contrast was administered. COMPARISON:  None. FINDINGS: Segmentation:  Standard Alignment: There is straightening of the normal lumbar spine lordosis. There is no antero or retrolisthesis. Vertebrae: Vertebral body heights are preserved. Marrow signal is somewhat heterogeneous throughout. There is a 1.0 cm focus of T1 hypointensity in the L2 vertebral body. There is mild marrow signal abnormality with endplate edema at 075-GRM with associated endplate irregularity. Conus medullaris and cauda equina: Conus extends to the mid L1 level. Conus and cauda equina appear normal. Paraspinal and other soft tissues: The paraspinal soft tissues are unremarkable. T2 hyperintense renal lesions are incompletely characterized but likely reflects cysts. Disc levels: There is disc desiccation and narrowing at L3-L4 through L5-S1, most advanced at L5-S1. There is multilevel facet arthropathy, most advanced at L2-L3 and L4-L5. There associated facet joint  effusions at multiple levels, most prominent on the left at L2-L3. T12-L1: No significant spinal canal or neural foraminal stenosis. L1-L2: There is mild bilateral facet arthropathy without significant spinal canal or neural foraminal stenosis. L2-L3: There is a mild disc bulge, ligamentum flavum thickening, and bilateral facet arthropathy resulting in mild spinal canal stenosis with crowding of the left subarticular zone without evidence of nerve root impingement and mild bilateral neural foraminal stenosis. L3-L4: There is a diffuse disc bulge, ligamentum flavum thickening, degenerative endplate change, and bilateral facet arthropathy resulting in mild spinal canal stenosis with crowding of the left subarticular zone without evidence of nerve root impingement and mild to moderate bilateral neural foraminal stenosis. L4-L5: There is a prominent disc bulge, ligamentum flavum thickening, prominent dorsal epidural fat, and bilateral facet arthropathy resulting in severe spinal canal stenosis with compression of the cauda equina nerve roots and impingement of the descending left nerve roots in the subarticular zone and severe right and moderate left neural foraminal stenosis with impingement of the exiting right L4 nerve root. L5-S1: This level was not imaged in the axial plane, degrading evaluation. There is a diffuse disc bulge, degenerative endplate change, and bilateral facet arthropathy resulting in mild spinal canal stenosis and severe bilateral neural foraminal stenosis. IMPRESSION: 1. Marrow signal abnormality at L5-S1 with associated endplate irregularity is most likely degenerative in nature; however, infection could have a similar appearance given elevated inflammatory markers. If discitis/osteomyelitis is of concern, recommend postcontrast imaging of the lumbar spine. 2. Degenerative changes at L4-L5 results in severe spinal canal stenosis with compression of the cauda equina nerve roots and impingement of  the descending left nerve roots in the subarticular zone, and severe right and moderate left neural foraminal stenosis with impingement of the exiting right L4 nerve root. 3. Severe bilateral neural foraminal stenosis at L5-S1. 4. Crowding of the left subarticular zones at L2-L3 and L3-L4 without evidence of nerve root impingement. Varying degrees of spinal canal and neural foraminal stenosis at the remaining levels as detailed above. 5. Multilevel facet arthropathy with associated facet joint effusions, most prominent on the left at L2-L3. 6. 1.0 cm focus of T1 hypointensity in the L2 vertebral body is indeterminate. Correlation with prior imaging studies, if available, would be helpful to assess for stability. This can also be assessed at the time of postcontrast imaging of the lumbar spine. Electronically Signed   By: Valetta Mole M.D.   On: 08/09/2021 21:00   MR ANKLE RIGHT WO CONTRAST  Result Date: 08/05/2021 CLINICAL DATA:  Mild widening of the mortise and soft tissue swelling on ankle radiography of 08/03/2021. Right ankle pain. Prior left above the knee amputation. EXAM: MRI OF THE RIGHT ANKLE WITHOUT CONTRAST TECHNIQUE: Multiplanar, multisequence MR imaging of the ankle was performed. No intravenous contrast was administered. COMPARISON:  Radiographs 08/03/2021 FINDINGS: TENDONS Peroneal: Unremarkable Posteromedial: Mild tibialis posterior tenosynovitis and distal tendinopathy, correlate clinically in assessing for tibialis posterior dysfunction. Mild flexor hallucis longus tenosynovitis just proximal to the knot of Henry. Anterior: Unremarkable Achilles: Unremarkable Plantar Fascia: Mild thickening of the medial band of the plantar fascia as on image 13 series 8, without substantial surrounding edema. Small plantar calcaneal spur. The thickening may reflect low grade plantar fasciitis. LIGAMENTS Lateral: Thickened but intact anterior inferior tibiofibular ligament. Attenuated anterior talofibular  ligament, suspicious for chronic or remote tear, without local synovitis to suggest anterolateral impingement. Calcaneofibular ligament appears intact. Medial: Grossly intact deep tibiotalar portion of the deltoid ligament. Indistinctness of tissue planes along the tibionavicular and tibiospring components of the deltoid ligament. CARTILAGE Ankle Joint: There is slightly more fluid space laterally in the tibiotalar joint compared to medially for example on image 17 of series 6 although this amounts to less than 2 mm. 0.3 cm focus of subcortical marrow edema posteromedially along the talar dome on image 16 series 6. Subtalar Joints/Sinus Tarsi: Low-level edema laterally along the subtalar joint on image 15 of series 9 may reflect low-grade lateral hindfoot impingement. There is edema in the sinus tarsi such that sinus tarsi syndrome is difficult to exclude. Bones: Osteochondral lesions of the calcaneocuboid joint are noted dorsally for example on image 16 series 7, with notable dorsal spurring. There is dorsal midfoot spurring as well. Other: No supplemental non-categorized findings. IMPRESSION: 1. Attenuated anterior talofibular ligament, likely from chronic or remote tear. This could partially facilitate some of the equivocal widening in the lateral tibiotalar joint space, although the calcaneofibular ligament and posterior talofibular ligament appear intact. 2. Subcortical marrow edema along the lateral subtalar joint on image 15 series 8 raises the possibility of mild lateral hindfoot impingement. 3. Edema in the sinus tarsi, cannot exclude sinus tarsi syndrome. 4. Substantial confluent osteochondral lesions of the dorsal calcaneocuboid joint attributed to arthropathy. 5. Ill definition of tibiospring and tibionavicular portions of the deltoid ligament, potentially chronically injured or sprained. The deep tibiotalar portion appears intact. 6. Mild tibialis posterior tenosynovitis and distal tendinopathy,  correlate clinically in assessing for tibialis posterior dysfunction. 7. Mild flexor hallucis longus tenosynovitis. 8. Low-grade plantar fasciitis suspected along the medial band plantar fascia. Electronically Signed   By: Van Clines M.D.   On: 08/05/2021 17:11   PERIPHERAL VASCULAR CATHETERIZATION  Result Date: 08/30/2021 Images from the original result were not included. Patient name: Jeffrey [name] MRN: [id number] DOB: Jul 07, 1961 Sex: male 08/30/2021 Pre-operative Diagnosis: End-stage renal disease: Malfunction right upper arm AV graft Post-operative diagnosis:  Same Surgeon:  Eda Paschal. Donzetta Matters, MD Procedure Performed: 1.  Ultrasound-guided cannulation right arm AV graft 2.  Right upper extremity shuntogram 3.  Drug-coated balloon angioplasty right innominate vein with 12 mm Lutonics 4.  Moderate sedation with fentanyl and Versed for 41 minutes Indications: 60 year old male with end-stage renal disease recently had right upper arm AV graft placed.  Now has swelling of the right arm he continues to dialyze via catheter.  He is indicated for fistulogram possible intervention. 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  This probably measured 70 to 80%.  At completion this was less than  20%.  There was improved thrill in the graft.  Procedure:  The patient was identified in the holding area and taken to room 8.  The patient was then placed supine on the table and prepped and draped in the usual sterile fashion.  A time out was called.  Ultrasound was used to evaluate the right arm AV graft.  There is no signs 1% lidocaine cannulated with a micropuncture needle followed by wire and sheath.  I extremity shuntogram was performed.  With the above findings we placed a Bentson wire followed by 7 [place] sheath.  We then primarily balloon angioplastied the lesion with 10 and 12 mm balloons.  We then used 12 mm drug-coated balloon and burst pressure for 3 minutes.  Completion demonstrated  residual stenosis less than 20% with improved thrill in the upper arm graft.  We suture-ligated the cannulation site remove the sheath.  He tolerated procedure without any complication. Contrast: 60cc Brandon C. Donzetta Matters, MD [facility] Office: [phone] Pager: [phone]   PERIPHERAL VASCULAR CATHETERIZATION  Result Date: 08/10/2021 Images from the original result were not included. Patient name: Jeffrey [name] MRN: [id number] DOB: 1961/07/27 Sex: male 08/03/2021 - 08/10/2021 Pre-operative Diagnosis: ESRD Post-operative diagnosis:  Same Surgeon:  Annamarie Major Procedure Performed:  1.  Right-sided central venogram  Indications: The patient is here today for access planning.  He has had a right upper arm loop graft which is occluded.  He has a catheter on the left side Procedure:  The patient was identified in the holding area and taken to room 8.  The IV in the right arm was utilized for contrast administration and fluoroscopic images were obtained Findings: Right axillary and subclavian vein are patent without any significant stenosis.  The right innominate vein appears to terminate near the catheter coming in from the left innominate vein.  There are images that appear to show contrast going past this area.  There were no prominent collaterals.  Impression:  #1  The right axillary, subclavian and innominate vein fully opacify.  No collaterals were visualized, however contrast appears to be held up where the catheter from the left side joints centrally.  The catheter is likely partially obstructive. Theotis Burrow, M.D., FACS [facility] Office: [phone] Pager:  [phone]   DG Chest Portable 1 View  Result Date: 08/04/2021 CLINICAL DATA:  Missed dialysis.  Chest pain EXAM: PORTABLE CHEST 1 VIEW COMPARISON:  03/28/2021 FINDINGS: Left dialysis catheter in place with the tip in the SVC. Heart and mediastinal contours are within normal limits. No  focal opacities or effusions. No acute bony abnormality. IMPRESSION: No active cardiopulmonary disease. Electronically Signed   By: Rolm Baptise M.D.   On: 08/04/2021 02:46   VAS US DUPLEX DIALYSIS ACCESS (AVF, AVG)  Result Date: 08/30/2021 DIALYSIS ACCESS Patient Name:  Jeffrey [name]  Date of Exam:   08/30/2021 Medical Rec #: [id number]    Accession #:    [id number] Date of Birth: 02/20/1961    Patient Gender: M Patient Age:   60 years Exam [facility]  [facility] Procedure:      VAS US DUPLEX DIALYSIS ACCESS (AVF, AVG) Referring Phys: Myna Bright --------------------------------------------------------------------------------  Reason for Exam: Pain and swelling s/p right arm brachial artery to axillary                  vein AVG placement on 08-12-2021. Access Site: Right Upper Extremity. Access Type: Upper arm loop AVG. History:  History of access removal from left arm for steal. History of          non-functioning right upper arm loop graft performed at outside          facility. Limitations: Body habitus and patient pain tolerance. Performing Technologist: Darlin Coco RDMS, RVT  Examination Guidelines: A complete evaluation includes B-mode imaging, spectral Doppler, color Doppler, and power Doppler as needed of all accessible portions of each vessel. Unilateral testing is considered an integral part of a complete examination. Limited examinations for reoccurring indications may be performed as noted.  Findings:   +--------------------+----------+-----------------+--------+ AVG                 PSV (cm/s)Flow Vol (mL/min)Describe +--------------------+----------+-----------------+--------+ Native artery inflow   269                              +--------------------+----------+-----------------+--------+ Arterial anastomosis   629                              +--------------------+----------+-----------------+--------+ Prox graft             534                               +--------------------+----------+-----------------+--------+ Mid graft              176                              +--------------------+----------+-----------------+--------+ Distal graft           399                              +--------------------+----------+-----------------+--------+ Venous outflow         270                              +--------------------+----------+-----------------+--------+  Summary: Patent AVG with low peak systolic velocities noted at the mid-graft without evidence of narrowing or obstruction.  *See table(s) above for measurements and observations.  Diagnosing physician: Servando Snare MD Electronically signed by Servando Snare MD on 08/30/2021 at 4:52:11 PM.   --------------------------------------------------------------------------------   Final     Microbiology: Recent Results (from the past 240 hour(s))  Resp Panel by RT-PCR (Flu A&B, Covid) Nasopharyngeal Swab     Status: None   Collection Time: 08/30/21 12:54 PM   Specimen: Nasopharyngeal Swab; Nasopharyngeal(NP) swabs in vial transport medium  Result Value Ref Range Status   SARS Coronavirus 2 by RT PCR NEGATIVE NEGATIVE Final    Comment: (NOTE) SARS-CoV-2 target nucleic acids are NOT DETECTED.  The SARS-CoV-2 RNA is generally detectable in upper respiratory specimens during the acute phase of infection. The lowest concentration of SARS-CoV-2 viral copies this assay can detect is 138 copies/mL. A negative result does not preclude SARS-Cov-2 infection and should not be used as the sole basis for treatment or other patient management decisions. A negative result may occur with  improper specimen collection/handling, submission of specimen other than nasopharyngeal swab, presence of viral mutation(s) within the areas targeted by this assay, and inadequate number of viral copies(<138 copies/mL). A negative result must be combined with clinical observations, patient history, and  epidemiological information. The expected result is Negative.  Fact Sheet for Patients:  EntrepreneurPulse.com.au  Fact Sheet for Healthcare Providers:  IncredibleEmployment.be  This test is no t yet approved or cleared by the [place] FDA and  has been authorized for detection and/or diagnosis of SARS-CoV-2 by FDA under an Emergency Use Authorization (EUA). This EUA will remain  in effect (meaning this test can be used) for the duration of the COVID-19 declaration under Section 564(b)(1) of the Act, 21 U.S.C.section 360bbb-3(b)(1), unless the authorization is terminated  or revoked sooner.       Influenza A by PCR NEGATIVE NEGATIVE Final   Influenza B by PCR NEGATIVE NEGATIVE Final    Comment: (NOTE) The Xpert Xpress SARS-CoV-2/FLU/RSV plus assay is intended as an aid in the diagnosis of influenza from Nasopharyngeal swab specimens and should not be used as a sole basis for treatment. Nasal washings and aspirates are unacceptable for Xpert Xpress SARS-CoV-2/FLU/RSV testing.  Fact Sheet for Patients: EntrepreneurPulse.com.au  Fact Sheet for Healthcare Providers: IncredibleEmployment.be  This test is not yet approved or cleared by the [place] FDA and has been authorized for detection and/or diagnosis of SARS-CoV-2 by FDA under an Emergency Use Authorization (EUA). This EUA will remain in effect (meaning this test can be used) for the duration of the COVID-19 declaration under Section 564(b)(1) of the Act, 21 U.S.C. section 360bbb-3(b)(1), unless the authorization is terminated or revoked.  Performed at [facility], [place] [address] [place], [place] [address]      Labs: Basic Metabolic Panel: Recent Labs  Lab 08/30/21 1045 08/31/21 0353  NA 137 136  K 4.0 4.5  CL 102 102  CO2 24 24  GLUCOSE 148* 375*  BUN 63* 61*  CREATININE 3.00* 2.82*  CALCIUM 8.6* 8.6*   Liver Function  Tests: Recent Labs  Lab 08/30/21 1045  AST 16  ALT 18  ALKPHOS 77  BILITOT 0.4  PROT 6.3*  ALBUMIN 3.2*   No results for input(s): LIPASE, AMYLASE in the last 168 hours. No results for input(s): AMMONIA in the last 168 hours. CBC: Recent Labs  Lab 08/30/21 1045 08/31/21 0353  WBC 6.2 6.3  NEUTROABS 4.5  --   HGB 9.0* 8.5*  HCT 29.1* 27.2*  MCV 90.1 88.0  PLT 130* 125*   Cardiac Enzymes: No results for input(s): CKTOTAL, CKMB, CKMBINDEX, TROPONINI in the last 168 hours. BNP: BNP (last 3 results) No results for input(s): BNP in the last 8760 hours.  ProBNP (last 3 results) No results for input(s): PROBNP in the last 8760 hours.  CBG: Recent Labs  Lab 08/30/21 2125 08/31/21 0127 08/31/21 0751 08/31/21 1124 08/31/21 1730  GLUCAP 308* 373* 326* 264* 97    Addendum:  Addendum is done on 09/04/2021 at 11:55 AM.    This is an addendum to the discharge summary done by Dr. Latanya Maudlin.  Patient will be discharged to skilled nursing facility today.  No significant changes overnight.   Signed: Dana Allan MD.  [facility] 08/31/2021, 6:02 PM

## 2021-08-31 NOTE — Evaluation (Signed)
Physical Therapy Evaluation Patient Details Name: Jeffrey [name] MRN: [id number] DOB: May 19, 1961 Today's Date: 08/31/2021  History of Present Illness  Pt is a 60 y/o male admitted secondary to R AV graft malfunction. Pt is s/p vein angioplasty on 10/3. PMH includes L AKA, ESRD on HD, CHF, COPD, DM, HTN.  Clinical Impression  Pt admitted secondary to problem above with deficits below. Placed pt in chair position to determine tolerance for upright positioning following HD and pt with dizziness and had multiple episodes of vomiting. Notified RN. Pt able to perform RLE HEP, but did experience severe cramping. Heat applied and gentle manual pressure provided. Pt currently at SNF for rehab and recommend return at d/c. Will continue to follow acutely.        Recommendations for follow up therapy are one component of a multi-disciplinary discharge planning process, led by the attending physician.  Recommendations may be updated based on patient status, additional functional criteria and insurance authorization.  Follow Up Recommendations SNF    Equipment Recommendations  None recommended by PT    Recommendations for Other Services       Precautions / Restrictions Precautions Precautions: Fall Precaution Comments: L AKA Restrictions Weight Bearing Restrictions: No      Mobility  Bed Mobility Overal bed mobility: Needs Assistance             General bed mobility comments: Placed pt in chair position in bed and pt became dizzy and vomited multiple times. Further mobility deferred.    Transfers                    Ambulation/Gait                Stairs            Wheelchair Mobility    Modified Rankin (Stroke Patients Only)       Balance                                             Pertinent Vitals/Pain Pain Assessment: Faces Faces Pain Scale: Hurts whole lot Pain Location: RLE cramps Pain Descriptors / Indicators:  Grimacing;Cramping Pain Intervention(s): Monitored during session;Limited activity within patient's tolerance;Repositioned    Home Living Family/patient expects to be discharged to:: Skilled nursing facility                      Prior Function Level of Independence: Needs assistance   Gait / Transfers Assistance Needed: Has been working with PT on transfers and ambulation at SNF.  ADL's / Homemaking Assistance Needed: Staff assists with ADLs as needed.        Hand Dominance        Extremity/Trunk Assessment   Upper Extremity Assessment Upper Extremity Assessment: RUE deficits/detail RUE Deficits / Details: RUE propped during session secondary to procedure.    Lower Extremity Assessment Lower Extremity Assessment: RLE deficits/detail RLE Deficits / Details: Able to perform heel slide and SLR, but notable cramping in R calf.    Cervical / Trunk Assessment Cervical / Trunk Assessment: Other exceptions Cervical / Trunk Exceptions: increased body habitus  Communication   Communication: No difficulties  Cognition Arousal/Alertness: Awake/alert Behavior During Therapy: WFL for tasks assessed/performed Overall Cognitive Status: No family/caregiver present to determine baseline cognitive functioning  General Comments: Very particular      General Comments      Exercises General Exercises - Lower Extremity Ankle Circles/Pumps: AROM;Right;5 reps;Limitations Ankle Circles/Pumps Limitations: /Limited by cramping Heel Slides: AROM;Right;5 reps;Limitations Heel Slides Limitations: Limited by cramping Other Exercises Other Exercises: Gentle soft tissue mobilization to R calf to help with cramping. Other Exercises: Finger flexion/extension on R hand   Assessment/Plan    PT Assessment Patient needs continued PT services  PT Problem List Decreased strength;Decreased mobility;Decreased safety awareness;Decreased activity  tolerance;Decreased balance;Decreased knowledge of use of DME;Pain       PT Treatment Interventions DME instruction;Therapeutic activities;Gait training;Therapeutic exercise;Patient/family education;Balance training;Functional mobility training;Neuromuscular re-education    PT Goals (Current goals can be found in the Care Plan section)  Acute Rehab PT Goals Patient Stated Goal: to decrease pain PT Goal Formulation: With patient Time For Goal Achievement: 09/14/21 Potential to Achieve Goals: Fair    Frequency Min 2X/week   Barriers to discharge        Co-evaluation               AM-PAC PT "6 Clicks" Mobility  Outcome Measure Help needed turning from your back to your side while in a flat bed without using bedrails?: A Little Help needed moving from lying on your back to sitting on the side of a flat bed without using bedrails?: A Little Help needed moving to and from a bed to a chair (including a wheelchair)?: A Lot Help needed standing up from a chair using your arms (e.g., wheelchair or bedside chair)?: Total Help needed to walk in hospital room?: Total Help needed climbing 3-5 steps with a railing? : Total 6 Click Score: 11    End of Session   Activity Tolerance: Treatment limited secondary to medical complications (Comment);Patient limited by pain (nausea/vomiting) Patient left: in bed;with call bell/phone within reach;with bed alarm set Nurse Communication: Mobility status;Other (comment) (nausea/vomiting) PT Visit Diagnosis: Other abnormalities of gait and mobility (R26.89);Difficulty in walking, not elsewhere classified (R26.2);Muscle weakness (generalized) (M62.81);Pain Pain - Right/Left: Right Pain - part of body: Leg    Time: [id number] PT Time Calculation (min) (ACUTE ONLY): 22 min   Charges:   PT Evaluation $PT Eval Moderate Complexity: 1 Mod          Reuel Derby, PT, DPT  Acute Rehabilitation Services  Pager: [phone] Office: [phone]   Rudean Hitt 08/31/2021, 4:49 PM

## 2021-08-31 NOTE — Progress Notes (Signed)
Inpatient Diabetes Program Recommendations  AACE/ADA: New Consensus Statement on Inpatient Glycemic Control (2015)  Target Ranges:  Prepandial:   less than 140 mg/dL      Peak postprandial:   less than 180 mg/dL (1-2 hours)      Critically ill patients:  140 - 180 mg/dL   Lab Results  Component Value Date   GLUCAP 80 08/31/2021   HGBA1C 9.5 (H) 08/04/2021    Review of Glycemic Control  Diabetes history: DM2 Outpatient Diabetes medications:  Non-dialysis days  Take 150 units before breakfast  Take 135 units before dinner  Dialysis days  Take 75 units in the morning  Take 125 units in the evening    F/U with [facility] every 3 months.  Current orders for Inpatient glycemic control: U-500 95 units in am and 45 units in pm, Novolog 0-6 units TID with meals  HgbA1C - 9.5% Sees Endo at [facility]  Inpatient Diabetes Program Recommendations:    Dialysis days:  U-500 75 units in am and 95 units in pm  Non-dialysis days: U-500 95 units in am and 110 units in pm  Continue Novolog 0-6 TID with meals  Titrate as needed.  Will continue to follow.  Thank you. Lorenda Peck, RD, LDN, CDE Inpatient Diabetes Coordinator [phone]

## 2021-08-31 NOTE — Progress Notes (Signed)
PT Cancellation Note  Patient Details Name: Jeffrey [name] MRN: [id number] DOB: 1961-08-02   Cancelled Treatment:    Reason Eval/Treat Not Completed: Patient at procedure or test/unavailable. Patient in HD. Will re-attempt later as time allows.     Eulanda Dorion 08/31/2021, 12:53 PM

## 2021-08-31 NOTE — Progress Notes (Signed)
Pt told me his personal insulin monitor gave him a "high sugar alert" and asked me to check his blood sugar for him. CBG was 373 and pt asymptomatic. Dr. Hal Hope notified and ordered 10 unites humalog insulin. Insulin was given. Will continue to monitor.

## 2021-08-31 NOTE — Progress Notes (Signed)
Concentrated Insulin U-500 dose confirmation   Confirmed with patient read back that patient takes concentrated U-500 kwikpen (also verified via Rx fill history):   On Non-dialysis days (TuThSaSun):  give 150 units before breakfast and 135 units before dinner  On dialysis days (MWF): give 75 units before breakfast and 125 units before dinner   Patient may also adjust dose according to blood sugar    Benetta Spar, PharmD, BCPS, [facility] Clinical Pharmacist  Please check AMION for all [place] phone numbers After 10:00 PM, call [place]

## 2021-08-31 NOTE — Plan of Care (Signed)
Pt had HD today off 2L. Dilaudid, Morphine, Zofran and Norco given for pain and 1x nausea/vomiting post HD. Pt is likely to be d/c'd to snf tomorrow. Tele monitoring discontinued.   Problem: Education: Goal: Knowledge of General Education information will improve Description: Including pain rating scale, medication(s)/side effects and non-pharmacologic comfort measures Outcome: Progressing   Problem: Health Behavior/Discharge Planning: Goal: Ability to manage health-related needs will improve Outcome: Progressing   Problem: Clinical Measurements: Goal: Ability to maintain clinical measurements within normal limits will improve Outcome: Progressing Goal: Will remain free from infection Outcome: Progressing Goal: Diagnostic test results will improve Outcome: Progressing Goal: Respiratory complications will improve Outcome: Progressing Goal: Cardiovascular complication will be avoided Outcome: Progressing   Problem: Activity: Goal: Risk for activity intolerance will decrease Outcome: Progressing   Problem: Nutrition: Goal: Adequate nutrition will be maintained Outcome: Progressing   Problem: Coping: Goal: Level of anxiety will decrease Outcome: Progressing   Problem: Elimination: Goal: Will not experience complications related to bowel motility Outcome: Progressing Goal: Will not experience complications related to urinary retention Outcome: Progressing   Problem: Pain Managment: Goal: General experience of comfort will improve Outcome: Progressing   Problem: Safety: Goal: Ability to remain free from injury will improve Outcome: Progressing   Problem: Skin Integrity: Goal: Risk for impaired skin integrity will decrease Outcome: Progressing

## 2021-08-31 NOTE — Progress Notes (Signed)
Remains OBS status. Appears transportation back to SNF will be an issue preventing him from dialyzing at his outpatient HD unit today. Therefore, will dialyze here prior to discharge.  Orders placed now.  Veneta Penton, PA-C [employer] Pager [phone]

## 2021-08-31 NOTE — Procedures (Signed)
   I was present at this dialysis session, have reviewed the session itself and made  appropriate changes Kelly Splinter MD [place] pager [phone]   08/31/2021, 3:57 PM

## 2021-08-31 NOTE — Progress Notes (Addendum)
Notified by renal PA that pt can receive HD treatment at his clinic today if pt is able to arrive by 10:00 am. Contacted attending MD and RN via secure chat. Advised pt is from snf therefore it is likely going to be difficult for pt to arrive at clinic by 10:00 am. Spoke to pt via phone and he confirms that he is from [facility] snf and plans to return there upon d/c. Will notify renal PA and attending. Pt plans to return to snf via EMS. Transportation is the barrier.   Melven Sartorius Renal Navigator [phone]  Addendum at 10:14 am: Discussed case with CSW who will need to make arrangements in order for pt to return to snf. Pt receives out-pt HD at [facility] on MWF. Pt needs to arrive at 12:00 for 12:20 chair time per Tiffany at clinic. Clinic reports that has been coming at this time even while at snf for rehab. Will assist as needed.

## 2021-08-31 NOTE — Progress Notes (Signed)
PT Cancellation Note  Patient Details Name: Jeffrey [name] MRN: [id number] DOB: 1961/07/04   Cancelled Treatment:    Reason Eval/Treat Not Completed: Patient at procedure or test/unavailable Pt off the floor. Will continue attempts.   Arby Barrette, PT Pager [phone]   Rexanne Mano 08/31/2021, 2:49 PM

## 2021-09-01 DIAGNOSIS — G546 Phantom limb syndrome with pain: Secondary | ICD-10-CM | POA: Diagnosis present

## 2021-09-01 DIAGNOSIS — Z992 Dependence on renal dialysis: Secondary | ICD-10-CM | POA: Diagnosis not present

## 2021-09-01 DIAGNOSIS — M7989 Other specified soft tissue disorders: Secondary | ICD-10-CM | POA: Diagnosis present

## 2021-09-01 DIAGNOSIS — I152 Hypertension secondary to endocrine disorders: Secondary | ICD-10-CM | POA: Diagnosis present

## 2021-09-01 DIAGNOSIS — I509 Heart failure, unspecified: Secondary | ICD-10-CM | POA: Diagnosis present

## 2021-09-01 DIAGNOSIS — F32A Depression, unspecified: Secondary | ICD-10-CM | POA: Diagnosis present

## 2021-09-01 DIAGNOSIS — N2581 Secondary hyperparathyroidism of renal origin: Secondary | ICD-10-CM | POA: Diagnosis present

## 2021-09-01 DIAGNOSIS — T82590A Other mechanical complication of surgically created arteriovenous fistula, initial encounter: Secondary | ICD-10-CM | POA: Diagnosis not present

## 2021-09-01 DIAGNOSIS — F4310 Post-traumatic stress disorder, unspecified: Secondary | ICD-10-CM | POA: Diagnosis present

## 2021-09-01 DIAGNOSIS — Z5320 Procedure and treatment not carried out because of patient's decision for unspecified reasons: Secondary | ICD-10-CM | POA: Diagnosis not present

## 2021-09-01 DIAGNOSIS — Z20822 Contact with and (suspected) exposure to covid-19: Secondary | ICD-10-CM | POA: Diagnosis present

## 2021-09-01 DIAGNOSIS — J449 Chronic obstructive pulmonary disease, unspecified: Secondary | ICD-10-CM | POA: Diagnosis present

## 2021-09-01 DIAGNOSIS — E1151 Type 2 diabetes mellitus with diabetic peripheral angiopathy without gangrene: Secondary | ICD-10-CM | POA: Diagnosis present

## 2021-09-01 DIAGNOSIS — E1159 Type 2 diabetes mellitus with other circulatory complications: Secondary | ICD-10-CM | POA: Diagnosis present

## 2021-09-01 DIAGNOSIS — D631 Anemia in chronic kidney disease: Secondary | ICD-10-CM | POA: Diagnosis present

## 2021-09-01 DIAGNOSIS — I132 Hypertensive heart and chronic kidney disease with heart failure and with stage 5 chronic kidney disease, or end stage renal disease: Secondary | ICD-10-CM | POA: Diagnosis present

## 2021-09-01 DIAGNOSIS — T82858A Stenosis of vascular prosthetic devices, implants and grafts, initial encounter: Secondary | ICD-10-CM | POA: Diagnosis present

## 2021-09-01 DIAGNOSIS — E785 Hyperlipidemia, unspecified: Secondary | ICD-10-CM | POA: Diagnosis present

## 2021-09-01 DIAGNOSIS — E1122 Type 2 diabetes mellitus with diabetic chronic kidney disease: Secondary | ICD-10-CM | POA: Diagnosis present

## 2021-09-01 DIAGNOSIS — Z87891 Personal history of nicotine dependence: Secondary | ICD-10-CM | POA: Diagnosis not present

## 2021-09-01 DIAGNOSIS — N186 End stage renal disease: Secondary | ICD-10-CM | POA: Diagnosis present

## 2021-09-01 DIAGNOSIS — R042 Hemoptysis: Secondary | ICD-10-CM | POA: Diagnosis not present

## 2021-09-01 DIAGNOSIS — R112 Nausea with vomiting, unspecified: Secondary | ICD-10-CM | POA: Diagnosis present

## 2021-09-01 DIAGNOSIS — Z8614 Personal history of Methicillin resistant Staphylococcus aureus infection: Secondary | ICD-10-CM | POA: Diagnosis not present

## 2021-09-01 DIAGNOSIS — Z6841 Body Mass Index (BMI) 40.0 and over, adult: Secondary | ICD-10-CM | POA: Diagnosis not present

## 2021-09-01 LAB — GLUCOSE, CAPILLARY
Glucose-Capillary: 68 mg/dL — ABNORMAL LOW (ref 70–99)
Glucose-Capillary: 108 mg/dL — ABNORMAL HIGH (ref 70–99)
Glucose-Capillary: 157 mg/dL — ABNORMAL HIGH (ref 70–99)
Glucose-Capillary: 131 mg/dL — ABNORMAL HIGH (ref 70–99)
Glucose-Capillary: 115 mg/dL — ABNORMAL HIGH (ref 70–99)
Glucose-Capillary: 168 mg/dL — ABNORMAL HIGH (ref 70–99)

## 2021-09-01 LAB — COMPREHENSIVE METABOLIC PANEL
ALT: 17 U/L (ref 0–44)
AST: 16 U/L (ref 15–41)
Albumin: 3.3 g/dL — ABNORMAL LOW (ref 3.5–5.0)
Alkaline Phosphatase: 92 U/L (ref 38–126)
Anion gap: 12 (ref 5–15)
BUN: 42 mg/dL — ABNORMAL HIGH (ref 8–23)
CO2: 28 mmol/L (ref 22–32)
Calcium: 8.5 mg/dL — ABNORMAL LOW (ref 8.9–10.3)
Chloride: 96 mmol/L — ABNORMAL LOW (ref 98–111)
Creatinine, Ser: 3.44 mg/dL — ABNORMAL HIGH (ref 0.61–1.24)
GFR, Estimated: 19 mL/min — ABNORMAL LOW (ref >60)
Glucose, Bld: 129 mg/dL — ABNORMAL HIGH (ref 70–99)
Potassium: 3.9 mmol/L (ref 3.5–5.1)
Sodium: 136 mmol/L (ref 135–145)
Total Bilirubin: 0.1 mg/dL — ABNORMAL LOW (ref 0.3–1.2)
Total Protein: 6.7 g/dL (ref 6.5–8.1)

## 2021-09-01 LAB — CBC
HCT: 29.6 % — ABNORMAL LOW (ref 39.0–52.0)
Hemoglobin: 9.1 g/dL — ABNORMAL LOW (ref 13.0–17.0)
MCH: 27.7 pg (ref 26.0–34.0)
MCHC: 30.7 g/dL (ref 30.0–36.0)
MCV: 90 fL (ref 80.0–100.0)
Platelets: 131 10*3/uL — ABNORMAL LOW (ref 150–400)
RBC: 3.29 MIL/uL — ABNORMAL LOW (ref 4.22–5.81)
RDW: 16.4 % — ABNORMAL HIGH (ref 11.5–15.5)
WBC: 8.2 10*3/uL (ref 4.0–10.5)
nRBC: 0 % (ref 0.0–0.2)

## 2021-09-01 LAB — HEPATITIS B SURFACE ANTIBODY, QUANTITATIVE: Hep B S AB Quant (Post): 29.9 m[IU]/mL (ref >9.9)

## 2021-09-01 MED ORDER — DARBEPOETIN ALFA 100 MCG/0.5ML IJ SOSY
100.0000 ug | PREFILLED_SYRINGE | Freq: Once | INTRAMUSCULAR | Status: AC
  Administered 2021-09-01: 100 ug via INTRAVENOUS
  Filled 2021-09-01: qty 0.5

## 2021-09-01 MED ORDER — HEPARIN SODIUM (PORCINE) 1000 UNIT/ML DIALYSIS
2000.0000 [IU] | Freq: Once | INTRAMUSCULAR | Status: DC
  Filled 2021-09-01: qty 2

## 2021-09-01 MED ORDER — DIAZEPAM 5 MG PO TABS
5.0000 mg | ORAL_TABLET | Freq: Two times a day (BID) | ORAL | Status: DC | PRN
  Administered 2021-09-01 – 2021-09-04 (×5): 5 mg via ORAL
  Filled 2021-09-01 (×5): qty 1

## 2021-09-01 MED ORDER — INSULIN REGULAR HUMAN (CONC) 500 UNIT/ML ~~LOC~~ SOPN
60.0000 [IU] | PEN_INJECTOR | Freq: Every day | SUBCUTANEOUS | Status: DC
  Administered 2021-09-02 – 2021-09-05 (×3): 60 [IU] via SUBCUTANEOUS

## 2021-09-01 MED ORDER — HEPARIN SODIUM (PORCINE) 1000 UNIT/ML IJ SOLN
INTRAMUSCULAR | Status: AC
  Filled 2021-09-01: qty 3

## 2021-09-01 MED ORDER — INSULIN REGULAR HUMAN (CONC) 500 UNIT/ML ~~LOC~~ SOPN
80.0000 [IU] | PEN_INJECTOR | Freq: Every day | SUBCUTANEOUS | Status: DC
  Administered 2021-09-01: 80 [IU] via SUBCUTANEOUS

## 2021-09-01 NOTE — Progress Notes (Addendum)
[facility] Progress Note   Subjective:  Remains OBS admit. Vomiting overnight and insurance authorization still pending, so discharge moved to tomorrow. Seen in room - RUE edema a little better. Denies CP/dyspnea. For HD today.  Objective Vitals:   08/31/21 1751 08/31/21 2057 09/01/21 0437 09/01/21 0809  BP: (!) 122/57 139/65 130/68 (!) 153/73  Pulse: 75 90 100 93  Resp: '19 15 17   '$ Temp:  98.4 F (36.9 C) 98.3 F (36.8 C)   TempSrc:  Oral Oral   SpO2: 96% 97% 93%   Weight:      Height:       Physical Exam General: Obese man, NAD. Room air. Heart: RRR; no murmur Lungs: CTA anteriorly Abdomen:soft Extremities: Trace RLE edema; L AKA Dialysis Access: TDC in L chest, RUE AVF + bruit  Additional Objective Labs: Basic Metabolic Panel: Recent Labs  Lab 08/30/21 1045 08/31/21 0353  NA 137 136  K 4.0 4.5  CL 102 102  CO2 24 24  GLUCOSE 148* 375*  BUN 63* 61*  CREATININE 3.00* 2.82*  CALCIUM 8.6* 8.6*   Liver Function Tests: Recent Labs  Lab 08/30/21 1045  AST 16  ALT 18  ALKPHOS 77  BILITOT 0.4  PROT 6.3*  ALBUMIN 3.2*   CBC: Recent Labs  Lab 08/30/21 1045 08/31/21 0353  WBC 6.2 6.3  NEUTROABS 4.5  --   HGB 9.0* 8.5*  HCT 29.1* 27.2*  MCV 90.1 88.0  PLT 130* 125*   Studies/Results: PERIPHERAL VASCULAR CATHETERIZATION  Result Date: 08/30/2021 Images from the original result were not included. Patient name: Jeffrey [name] MRN: [id number] DOB: [birth year] Sex: male 08/30/2021 Pre-operative Diagnosis: End-stage renal disease: Malfunction right upper arm AV graft Post-operative diagnosis:  Same Surgeon:  Eda Paschal. Donzetta Matters, MD Procedure Performed: 1.  Ultrasound-guided cannulation right arm AV graft 2.  Right upper extremity shuntogram 3.  Drug-coated balloon angioplasty right innominate vein with 12 mm Lutonics 4.  Moderate sedation with fentanyl and Versed for 41 minutes Indications: 60 year old male with end-stage renal disease recently had right  upper arm AV graft placed.  Now has swelling of the right arm he continues to dialyze via catheter.  He is indicated for fistulogram possible intervention. 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  This probably measured 70 to 80%.  At completion this was less than 20%.  There was improved thrill in the graft.  Procedure:  The patient was identified in the holding area and taken to room 8.  The patient was then placed supine on the table and prepped and draped in the usual sterile fashion.  A time out was called.  Ultrasound was used to evaluate the right arm AV graft.  There is no signs 1% lidocaine cannulated with a micropuncture needle followed by wire and sheath.  I extremity shuntogram was performed.  With the above findings we placed a Bentson wire followed by 7 [place] sheath.  We then primarily balloon angioplastied the lesion with 10 and 12 mm balloons.  We then used 12 mm drug-coated balloon and burst pressure for 3 minutes.  Completion demonstrated residual stenosis less than 20% with improved thrill in the upper arm graft.  We suture-ligated the cannulation site remove the sheath.  He tolerated procedure without any complication. Contrast: 60cc Brandon C. Donzetta Matters, MD [facility] Office: [phone] Pager: [phone]    Medications:   amLODipine  5 mg Oral QHS   aspirin EC  81 mg  Oral Daily   atorvastatin  40 mg Oral Daily   calcium acetate  1,334 mg Oral TID with meals   carvedilol  12.5 mg Oral BID WC   Chlorhexidine Gluconate Cloth  6 each Topical Daily   heparin  5,000 Units Subcutaneous Q8H   insulin aspart  0-6 Units Subcutaneous TID WC   insulin regular human CONCENTRATED  45 Units Subcutaneous Q supper   insulin regular human CONCENTRATED  80 Units Subcutaneous Q breakfast   nortriptyline  20 mg Oral QHS   pantoprazole  20 mg Oral Daily   pregabalin  25 mg Oral BID   sertraline  100 mg Oral Daily    Dialysis  Orders: MWF at [facility] 3:45hr, 450/A1.5, EDW 122kg, 2K/2.5Ca, TDC, heparin 2000 - Mircera 6mg IV q 2 wks (last 9/22)  Assessment/Plan: 1. S/p RUE fistulogram + innomonate vein angioplasty 10/3: edema improving. 2. ESRD: Continue HD per usual MWF schedule - HD today. 3. HTN/volume: Variable BP - UF as tolerated - unclear if weights correct, if so he is up 9kg. 4L UFG today. 4. Anemia: Hgb 8.5 - will give dose Aranesp today. 5. Secondary hyperparathyroidism: Ca ok, continue home Phoslo 6. T2DM 7. Vomiting: Per hospitalist.  KVeneta Penton PA-C 09/01/2021, 10:18 AM  [facility]

## 2021-09-01 NOTE — TOC Progression Note (Signed)
Transition of Care [facility]) - Progression Note    Patient Details  Name: Jeffrey [name] MRN: [id number] Date of Birth: 05-27-1961  Transition of Care [facility]) CM/SW [place], [place] Phone Number: 09/01/2021, 11:12 AM  Clinical Narrative:      CSW withdrew insurance authorization for patient. CSW will restart insurance authorization once closer to being medically ready for dc and able to work with PT. CSW will need current PT notes for tomorrow if patient will be medically ready tomorrow.CSW will continue to follow and assist with dc planning needs.    Expected Discharge Plan: [place] Barriers to Discharge: No Barriers Identified  Expected Discharge Plan and Services Expected Discharge Plan: [place] In-house Referral: Clinical Social Work     Living arrangements for the past 2 months: [place] Expected Discharge Date: 08/31/21                                     Social Determinants of Health (SDOH) Interventions    Readmission Risk Interventions Readmission Risk Prevention Plan 04/07/2021  Transportation Screening Complete  Medication Review Press photographer) Referral to Pharmacy  PCP or Specialist appointment within 3-5 days of discharge Complete  HRI or Home Care Consult Complete  SW Recovery Care/Counseling Consult Complete  Palliative Care Screening Not [place] Complete

## 2021-09-01 NOTE — Progress Notes (Signed)
PROGRESS NOTE    Jeffrey [name]  [id number] DOB: 04/25/1961 DOA: 08/30/2021 PCP: Pcp, No  Brief Narrative:Jeffrey [name] is a 60 y.o. male with medical history significant of COPD; DM on complex regimen of U5 100 insulin; HTN; PVD s/p L AKA with h/o MRSA; and ESRD on MWF HD presenting with RUE edema s/p AV graft placement.   He had the graft placed during last admission and his arm is swollen and painful.  It has been swollen since placement and it has been slightly painful but now worse.  He missed HD last Friday because the "tech was real snotty and I wasn't in the mood for her."  He did have a full session last Wednesday.  -Admitted, underwent repair of right AV graft, was supposed to discharge back to SNF yesterday -Multiple episodes of vomiting overnight of this morning  Assessment & Plan:   RUE edema, AV graft malfunction -Patient presented with pain and swelling at the site of recently placed right upper extremity AV graft  -Seen by vascular surgery Dr. Donzetta Matters, underwent shuntogram followed by balloon angioplasty of right innominate vein on 10/3 -Was stable to be discharged back to SNF on oxycodone yesterday however had multiple episodes of vomiting after HD yesterday evening and another episode of vomiting today --Has [facility] for dialysis, will discharge back to SNF after HD today   ESRD on HD -Patient on chronic MWF HD -Missed HD on Monday and last Friday however did not appear to be significantly volume overloaded  -Access issues as noted above, has a left TDC in place for HD  -Received HD yesterday, plan for repeat HD today -On metolazone and torsemide at baseline, this was resumed at discharge   Recurrent vomiting -?  Some component of uremia from missing multiple HD treatments, narcotics (receiving IV Dilaudid since graft repair),  -Abdominal exam is benign, afebrile and nontoxic -Phlebotomy was not able to draw labs this morning, reattempt on HD -Supportive care, clears today, will  decrease insulin dose -If symptoms persist will obtain CT abdomen pelvis   HTN -Continue Norvasc, Coreg   DM -continue U500, he is followed by [facility] endocrinology on a complex regimen of Insulin that's different on Dialysis and Non dialysis days, resumed at a lower dose now, on account of multiple episodes of vomiting   Mood d/o Depression -Continue Zoloft, nortriptyline   COPD -Continue Albuterol if needed -He does not wear home O2   S/p L AKA -h/o MRSA -Continue ASA, Lyrica   HLD -Continue Lipitor   DVT prophylaxis: Heparin subcu Code Status: Full code Family Communication: Discussed patient in detail, no family at bedside Disposition Plan:  Status is: Observation  The patient will require care spanning > 2 midnights and should be moved to inpatient because: Inpatient level of care appropriate due to severity of illness  Dispo: The patient is from: SNF              Anticipated d/c is to: SNF              Patient currently is not medically stable to d/c.   Difficult to place patient No     Procedure -Pre-operative Diagnosis: End-stage renal disease: Malfunction right upper arm AV graft Post-operative diagnosis:  Same Surgeon:  Eda Paschal. Donzetta Matters, MD Procedure Performed: 1.  Ultrasound-guided cannulation right arm AV graft 2.  Right upper extremity shuntogram 3.  Drug-coated balloon angioplasty right innominate vein with 12 mm Lutonics 4.  Moderate sedation with fentanyl and Versed for  41 minutes       Consultants:  Vascular surgery, nephrology  Procedures:   Antimicrobials:    Subjective: -Had an episode of vomiting this morning and 2 episodes yesterday evening after HD  Objective: Vitals:   08/31/21 1751 08/31/21 2057 09/01/21 0437 09/01/21 0809  BP: (!) 122/57 139/65 130/68 (!) 153/73  Pulse: 75 90 100 93  Resp: '19 15 17   '$ Temp:  98.4 F (36.9 C) 98.3 F (36.8 C)   TempSrc:  Oral Oral   SpO2: 96% 97% 93%   Weight:      Height:         Intake/Output Summary (Last 24 hours) at 09/01/2021 1221 Last data filed at 09/01/2021 1040 Gross per 24 hour  Intake --  Output 1902 ml  Net -1902 ml   Filed Weights   08/31/21 1215 08/31/21 1537  Weight: 133.1 kg 131.2 kg    Examination:  General exam: Morbidly obese male sitting up in bed, AAOx3, no distress HEENT: Neck obese unable to assess JVD CVS: S1-S2, regular rate rhythm Abdomen: Obese, soft, nontender, bowel sounds decreased but present Extremities: Left AKA, no edema and right, right arm with swelling around surgical site Skin: No rash on exposed skin Psychiatry: Mood & affect appropriate.     Data Reviewed:   CBC: Recent Labs  Lab 08/30/21 1045 08/31/21 0353  WBC 6.2 6.3  NEUTROABS 4.5  --   HGB 9.0* 8.5*  HCT 29.1* 27.2*  MCV 90.1 88.0  PLT 130* 0000000*   Basic Metabolic Panel: Recent Labs  Lab 08/30/21 1045 08/31/21 0353  NA 137 136  K 4.0 4.5  CL 102 102  CO2 24 24  GLUCOSE 148* 375*  BUN 63* 61*  CREATININE 3.00* 2.82*  CALCIUM 8.6* 8.6*   GFR: Estimated Creatinine Clearance: 34.8 mL/min (A) (by C-G formula based on SCr of 2.82 mg/dL (H)). Liver Function Tests: Recent Labs  Lab 08/30/21 1045  AST 16  ALT 18  ALKPHOS 77  BILITOT 0.4  PROT 6.3*  ALBUMIN 3.2*   No results for input(s): LIPASE, AMYLASE in the last 168 hours. No results for input(s): AMMONIA in the last 168 hours. Coagulation Profile: No results for input(s): INR, PROTIME in the last 168 hours. Cardiac Enzymes: No results for input(s): CKTOTAL, CKMB, CKMBINDEX, TROPONINI in the last 168 hours. BNP (last 3 results) No results for input(s): PROBNP in the last 8760 hours. HbA1C: No results for input(s): HGBA1C in the last 72 hours. CBG: Recent Labs  Lab 08/31/21 2201 09/01/21 0237 09/01/21 0737 09/01/21 0859 09/01/21 1109  GLUCAP 89 68* 108* 157* 131*   Lipid Profile: No results for input(s): CHOL, HDL, LDLCALC, TRIG, CHOLHDL, LDLDIRECT in the last 72  hours. Thyroid Function Tests: No results for input(s): TSH, T4TOTAL, FREET4, T3FREE, THYROIDAB in the last 72 hours. Anemia Panel: No results for input(s): VITAMINB12, FOLATE, FERRITIN, TIBC, IRON, RETICCTPCT in the last 72 hours. Urine analysis:    Component Value Date/Time   COLORURINE AMBER (A) 03/30/2021 0239   APPEARANCEUR HAZY (A) 03/30/2021 0239   LABSPEC 1.018 03/30/2021 0239   PHURINE 5.0 03/30/2021 0239   GLUCOSEU NEGATIVE 03/30/2021 0239   HGBUR NEGATIVE 03/30/2021 0239   BILIRUBINUR NEGATIVE 03/30/2021 0239   KETONESUR NEGATIVE 03/30/2021 0239   PROTEINUR NEGATIVE 03/30/2021 0239   NITRITE NEGATIVE 03/30/2021 0239   LEUKOCYTESUR NEGATIVE 03/30/2021 0239   Sepsis Labs: '@LABRCNTIP'$ (procalcitonin:4,lacticidven:4)  ) Recent Results (from the past 240 hour(s))  Resp Panel by RT-PCR (Flu A&B,  Covid) Nasopharyngeal Swab     Status: None   Collection Time: 08/30/21 12:54 PM   Specimen: Nasopharyngeal Swab; Nasopharyngeal(NP) swabs in vial transport medium  Result Value Ref Range Status   SARS Coronavirus 2 by RT PCR NEGATIVE NEGATIVE Final    Comment: (NOTE) SARS-CoV-2 target nucleic acids are NOT DETECTED.  The SARS-CoV-2 RNA is generally detectable in upper respiratory specimens during the acute phase of infection. The lowest concentration of SARS-CoV-2 viral copies this assay can detect is 138 copies/mL. A negative result does not preclude SARS-Cov-2 infection and should not be used as the sole basis for treatment or other patient management decisions. A negative result may occur with  improper specimen collection/handling, submission of specimen other than nasopharyngeal swab, presence of viral mutation(s) within the areas targeted by this assay, and inadequate number of viral copies(<138 copies/mL). A negative result must be combined with clinical observations, patient history, and epidemiological information. The expected result is Negative.  Fact Sheet for  Patients:  EntrepreneurPulse.com.au  Fact Sheet for Healthcare Providers:  IncredibleEmployment.be  This test is no t yet approved or cleared by the [place] FDA and  has been authorized for detection and/or diagnosis of SARS-CoV-2 by FDA under an Emergency Use Authorization (EUA). This EUA will remain  in effect (meaning this test can be used) for the duration of the COVID-19 declaration under Section 564(b)(1) of the Act, 21 U.S.C.section 360bbb-3(b)(1), unless the authorization is terminated  or revoked sooner.       Influenza A by PCR NEGATIVE NEGATIVE Final   Influenza B by PCR NEGATIVE NEGATIVE Final    Comment: (NOTE) The Xpert Xpress SARS-CoV-2/FLU/RSV plus assay is intended as an aid in the diagnosis of influenza from Nasopharyngeal swab specimens and should not be used as a sole basis for treatment. Nasal washings and aspirates are unacceptable for Xpert Xpress SARS-CoV-2/FLU/RSV testing.  Fact Sheet for Patients: EntrepreneurPulse.com.au  Fact Sheet for Healthcare Providers: IncredibleEmployment.be  This test is not yet approved or cleared by the [place] FDA and has been authorized for detection and/or diagnosis of SARS-CoV-2 by FDA under an Emergency Use Authorization (EUA). This EUA will remain in effect (meaning this test can be used) for the duration of the COVID-19 declaration under Section 564(b)(1) of the Act, 21 U.S.C. section 360bbb-3(b)(1), unless the authorization is terminated or revoked.  Performed at [facility], [place] [address] [place], [place] [address]          Radiology Studies: PERIPHERAL VASCULAR CATHETERIZATION  Result Date: 08/30/2021 Images from the original result were not included. Patient name: Crawford Guadagnino MRN: [id number] DOB: 18-Feb-1961 Sex: male 08/30/2021 Pre-operative Diagnosis: End-stage renal disease: Malfunction right upper arm AV graft  Post-operative diagnosis:  Same Surgeon:  Eda Paschal. Donzetta Matters, MD Procedure Performed: 1.  Ultrasound-guided cannulation right arm AV graft 2.  Right upper extremity shuntogram 3.  Drug-coated balloon angioplasty right innominate vein with 12 mm Lutonics 4.  Moderate sedation with fentanyl and Versed for 41 minutes Indications: 60 year old male with end-stage renal disease recently had right upper arm AV graft placed.  Now has swelling of the right arm he continues to dialyze via catheter.  He is indicated for fistulogram possible intervention. 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  This probably measured 70 to 80%.  At completion this was less than 20%.  There was improved thrill in the graft.  Procedure:  The patient was identified in the holding area and taken to  room 8.  The patient was then placed supine on the table and prepped and draped in the usual sterile fashion.  A time out was called.  Ultrasound was used to evaluate the right arm AV graft.  There is no signs 1% lidocaine cannulated with a micropuncture needle followed by wire and sheath.  I extremity shuntogram was performed.  With the above findings we placed a Bentson wire followed by 7 [place] sheath.  We then primarily balloon angioplastied the lesion with 10 and 12 mm balloons.  We then used 12 mm drug-coated balloon and burst pressure for 3 minutes.  Completion demonstrated residual stenosis less than 20% with improved thrill in the upper arm graft.  We suture-ligated the cannulation site remove the sheath.  He tolerated procedure without any complication. Contrast: 60cc Brandon C. Donzetta Matters, MD [facility] Office: [phone] Pager: [phone]        Scheduled Meds:  amLODipine  5 mg Oral QHS   aspirin EC  81 mg Oral Daily   atorvastatin  40 mg Oral Daily   calcium acetate  1,334 mg Oral TID with meals   carvedilol  12.5 mg Oral BID WC   Chlorhexidine Gluconate Cloth  6  each Topical Daily   darbepoetin (ARANESP) injection - DIALYSIS  100 mcg Intravenous Once   heparin  2,000 Units Dialysis Once in dialysis   heparin  5,000 Units Subcutaneous Q8H   insulin aspart  0-6 Units Subcutaneous TID WC   insulin regular human CONCENTRATED  45 Units Subcutaneous Q supper   insulin regular human CONCENTRATED  80 Units Subcutaneous Q breakfast   nortriptyline  20 mg Oral QHS   pantoprazole  20 mg Oral Daily   pregabalin  25 mg Oral BID   sertraline  100 mg Oral Daily   Continuous Infusions:   LOS: 0 days    Time spent: 75mn    PDomenic Polite MD [facility]   09/01/2021, 12:21 PM

## 2021-09-02 ENCOUNTER — Inpatient Hospital Stay (HOSPITAL_COMMUNITY): Payer: Medicare Other

## 2021-09-02 DIAGNOSIS — N186 End stage renal disease: Secondary | ICD-10-CM

## 2021-09-02 DIAGNOSIS — E1159 Type 2 diabetes mellitus with other circulatory complications: Secondary | ICD-10-CM

## 2021-09-02 DIAGNOSIS — T82590A Other mechanical complication of surgically created arteriovenous fistula, initial encounter: Secondary | ICD-10-CM | POA: Diagnosis not present

## 2021-09-02 DIAGNOSIS — I152 Hypertension secondary to endocrine disorders: Secondary | ICD-10-CM

## 2021-09-02 DIAGNOSIS — R112 Nausea with vomiting, unspecified: Principal | ICD-10-CM

## 2021-09-02 LAB — CBC
HCT: 35.1 % — ABNORMAL LOW (ref 39.0–52.0)
Hemoglobin: 11 g/dL — ABNORMAL LOW (ref 13.0–17.0)
MCH: 27.7 pg (ref 26.0–34.0)
MCHC: 31.3 g/dL (ref 30.0–36.0)
MCV: 88.4 fL (ref 80.0–100.0)
Platelets: 129 10*3/uL — ABNORMAL LOW (ref 150–400)
RBC: 3.97 MIL/uL — ABNORMAL LOW (ref 4.22–5.81)
RDW: 16.5 % — ABNORMAL HIGH (ref 11.5–15.5)
WBC: 8.2 10*3/uL (ref 4.0–10.5)
nRBC: 0 % (ref 0.0–0.2)

## 2021-09-02 LAB — COMPREHENSIVE METABOLIC PANEL
ALT: 14 U/L (ref 0–44)
AST: 18 U/L (ref 15–41)
Albumin: 4 g/dL (ref 3.5–5.0)
Alkaline Phosphatase: 106 U/L (ref 38–126)
Anion gap: 13 (ref 5–15)
BUN: 30 mg/dL — ABNORMAL HIGH (ref 8–23)
CO2: 27 mmol/L (ref 22–32)
Calcium: 9.3 mg/dL (ref 8.9–10.3)
Chloride: 94 mmol/L — ABNORMAL LOW (ref 98–111)
Creatinine, Ser: 3.67 mg/dL — ABNORMAL HIGH (ref 0.61–1.24)
GFR, Estimated: 18 mL/min — ABNORMAL LOW (ref >60)
Glucose, Bld: 74 mg/dL (ref 70–99)
Potassium: 3.7 mmol/L (ref 3.5–5.1)
Sodium: 134 mmol/L — ABNORMAL LOW (ref 135–145)
Total Bilirubin: 0.4 mg/dL (ref 0.3–1.2)
Total Protein: 7.9 g/dL (ref 6.5–8.1)

## 2021-09-02 LAB — GLUCOSE, CAPILLARY
Glucose-Capillary: 97 mg/dL (ref 70–99)
Glucose-Capillary: 258 mg/dL — ABNORMAL HIGH (ref 70–99)
Glucose-Capillary: 99 mg/dL (ref 70–99)
Glucose-Capillary: 148 mg/dL — ABNORMAL HIGH (ref 70–99)

## 2021-09-02 MED ORDER — PANTOPRAZOLE SODIUM 40 MG PO TBEC
40.0000 mg | DELAYED_RELEASE_TABLET | Freq: Two times a day (BID) | ORAL | Status: DC
  Administered 2021-09-02 – 2021-09-05 (×6): 40 mg via ORAL
  Filled 2021-09-02 (×6): qty 1

## 2021-09-02 NOTE — Progress Notes (Signed)
PT Cancellation Note  Patient Details Name: Jeffrey [name] MRN: [id number] DOB: [birth year]   Cancelled Treatment:    Reason Eval/Treat Not Completed: Patient at procedure or test/unavailable Pt currently in HD; will follow up as schedule allows.   Lou Miner, DPT  Acute Rehabilitation Services  Pager: [phone] Office: [phone]    Rudean Hitt 09/02/2021, 2:53 PM

## 2021-09-02 NOTE — TOC Progression Note (Signed)
Transition of Care [facility]) - Progression Note    Patient Details  Name: Brison Wist MRN: [id number] Date of Birth: December 16, 1960  Transition of [facility]) CM/SW [place], [place] Phone Number: 09/02/2021, 11:04 AM  Clinical Narrative:     CSW awaiting PT eval for current PT note to restart insurance authorization for patient for possible SNF placement. Patient has SNF bed at [facility]. Plan to restart insurance authorization once current PT note is in. CSW will continue to follow and assist with patient dc planning needs.  Expected Discharge Plan: [place] Barriers to Discharge: No Barriers Identified  Expected Discharge Plan and Services Expected Discharge Plan: [place] In-house Referral: Clinical Social Work     Living arrangements for the past 2 months: [place] Expected Discharge Date: 08/31/21                                     Social Determinants of Health (SDOH) Interventions    Readmission Risk Interventions Readmission Risk Prevention Plan 04/07/2021  Transportation Screening Complete  Medication Review Press photographer) Referral to Pharmacy  PCP or Specialist appointment within 3-5 days of discharge Complete  HRI or Home Care Consult Complete  SW Recovery Care/Counseling Consult Complete  Palliative Care Screening Not [place] Complete

## 2021-09-02 NOTE — Progress Notes (Signed)
Spoke to Quarry manager at [employer]. While pt is at snf for rehab, pt goes to [facility] MWF with 10:30 arrival time and 10:50 chair time. Provided this info to CSW due to pt's return to snf at d/c.   Melven Sartorius Renal Navigator [phone]

## 2021-09-02 NOTE — Progress Notes (Signed)
   09/02/21 1307  Clinical Encounter Type  Visited With Patient not available  Visit Type Code  Referral From Nurse  Consult/Referral To Chaplain   Chaplain responded. The patient is being treated by the medical team. There is no support person present. Chaplain remain available for follow-up spiritual/ emotional support as needed. This note was prepared by Jeanine Luz, M.Div..  For questions please contact by phone [phone].

## 2021-09-02 NOTE — Consult Note (Signed)
[facility] Renal Consultation Note    Indication for Consultation:  Management of ESRD/hemodialysis, anemia, hypertension/volume, and secondary hyperparathyroidism. PCP:  HPI: Jeffrey [name] is a 60 y.o. male with ESRD, T2DM, HTN, CHF.  Initially admitted OBS status s/p RUE fistulogram on 10/3. SNF transportation issues resulted in a delay getting him to his outpatient HD on 10/4, therefore he was dialyzed overnight at [facility]. On 10/5, he was dialyzed again to maintain his usual MWF schedule - ended early due to cramping. He also reported pain and vomiting throughout the day, therefore his admission status was changed from OBS to full admit thus necessitating a full consult note.  Seen in room this morning - had vomiting overnight. Plan is for CT abdomen today per notes. Denies CP or dyspnea.  Dialyzes on MWF schedule at [facility] HD clinic via [facility].  Past Medical History:  Diagnosis Date   Arthritis    Blood transfusion without reported diagnosis    CHF (congestive heart failure) (HCC)    COPD (chronic obstructive pulmonary disease) ([place])    Diabetes mellitus without complication ([place])    Hypertension    PTSD (post-traumatic stress disorder)    Renal disorder    Stage 5 Kidney Failure   Past Surgical History:  Procedure Laterality Date   A/V FISTULAGRAM N/A 08/30/2021   Procedure: A/V FISTULAGRAM;  Surgeon: Waynetta Sandy, MD;  Location: [place] [facility];  Service: Cardiovascular;  Laterality: N/A;   AMPUTATION Left 04/03/2021   Procedure: LEFT ABOVE-THE-KNEE AMPUTATION;  Surgeon: Erle Crocker, MD;  Location: [place];  Service: Orthopedics;  Laterality: Left;   AV FISTULA PLACEMENT Right 08/12/2021   Procedure: INSERTION OF ARTERIOVENOUS (AV) GORE-TEX GRAFT RIGHT ARM;  Surgeon: Serafina Mitchell, MD;  Location: [place];  Service: Vascular;  Laterality: Right;   IR REMOVAL TUN CV CATH W/O FL  03/30/2021   JOINT REPLACEMENT     Bilateral knees   PERIPHERAL VASCULAR  BALLOON ANGIOPLASTY Right 08/30/2021   Procedure: PERIPHERAL VASCULAR BALLOON ANGIOPLASTY;  Surgeon: Waynetta Sandy, MD;  Location: [place] [facility];  Service: Cardiovascular;  Laterality: Right;   UPPER EXTREMITY VENOGRAPHY Right 08/10/2021   Procedure: UPPER EXTREMITY VENOGRAPHY;  Surgeon: Serafina Mitchell, MD;  Location: [place] [facility];  Service: Cardiovascular;  Laterality: Right;   History reviewed. No pertinent family history. Social History:  reports that he quit smoking about 10 years ago. His smoking use included cigarettes. He has never used smokeless tobacco. He reports current alcohol use. He reports that he does not currently use drugs.  ROS: As per HPI otherwise negative.  Physical Exam: Vitals:   09/01/21 1711 09/01/21 2111 09/02/21 0625 09/02/21 0935  BP: (!) 125/59 139/69 110/84 (!) 143/61  Pulse: 89 98 81 99  Resp: '17 17 18 18  '$ Temp:  98.3 F (36.8 C) 97.6 F (36.4 C) 98.1 F (36.7 C)  TempSrc:  Oral Axillary Oral  SpO2: 95% 94% 95% 92%  Weight:      Height:         General: Well developed, well nourished, in no acute distress. Nasal O2 in place. Head: Normocephalic, atraumatic, sclera non-icteric, mucus membranes are moist. Neck: Supple without lymphadenopathy/masses. JVD not elevated. Lungs: Clear bilaterally to auscultation without wheezes, rales, or rhonchi.  Heart: RRR with normal S1, S2. No murmurs, rubs, or gallops appreciated. Abdomen: Soft, non-tender, non-distended with normoactive bowel sounds. No rebound/guarding. No obvious abdominal masses. Musculoskeletal:  Strength and tone appear normal for age.  Lower extremities: No edema or ischemic changes, no open wounds. Neuro: Alert and oriented X 3. Moves all extremities spontaneously. Psych:  Responds to questions appropriately with a normal affect. Dialysis Access: [facility]  Allergies  Allergen Reactions   Bupropion Swelling   Dexmedetomidine Nausea And Vomiting    Other reaction(s):  Other (See Comments) Dose-limiting bradycardia Dose-limiting bradycardia    Ibuprofen Shortness Of Breath and Swelling    Pt tolerates aspirin Pt tolerates aspirin    Pioglitazone Anaphylaxis and Rash   Tomato Anaphylaxis    Only allergic to RAW tomatoes   Prior to Admission medications   Medication Sig Start Date End Date Taking? Authorizing Provider  acetaminophen (TYLENOL) 325 MG tablet Take 2 tablets (650 mg total) by mouth every 6 (six) hours as needed for moderate pain. 04/14/21  Yes Sheikh, Omair Latif, DO  albuterol (PROVENTIL) (2.5 MG/3ML) 0.083% nebulizer solution Inhale 2.5 mg into the lungs every 8 (eight) hours as needed for wheezing or shortness of breath.   Yes [provider]  albuterol (VENTOLIN HFA) 108 (90 Base) MCG/ACT inhaler Inhale 1-2 puffs into the lungs every 6 (six) hours as needed for wheezing or shortness of breath.   Yes [provider]  amLODipine (NORVASC) 5 MG tablet Take 5 mg by mouth at bedtime. 04/17/20  Yes [provider]  ASPIRIN LOW DOSE 81 MG EC tablet Take 81 mg by mouth daily. 02/22/21  Yes [provider]  atorvastatin (LIPITOR) 40 MG tablet Take 40 mg by mouth daily. 02/09/21  Yes [provider]  calcium acetate (PHOSLO) 667 MG capsule Take 2 capsules (1,334 mg total) by mouth with breakfast, with lunch, and with evening meal. 04/10/21  Yes Debbe Odea, MD  carvedilol (COREG) 12.5 MG tablet Take 12.5 mg by mouth 2 (two) times daily with a meal. 04/17/20  Yes [provider]  insulin regular human CONCENTRATED (HUMULIN R U-500 KWIKPEN) 500 UNIT/ML KwikPen Inject 75-150 Units into the skin 2 (two) times daily with a meal. On Non-dialysis days, give 150 units before breakfast and 135 units before dinner On dialysis days, give 75 units before breakfast and 125 units before dinner  Patient may adjust according to blood sugar   Yes [provider]  metolazone (ZAROXOLYN) 10 MG tablet Take 10 mg  by mouth daily. 07/13/21  Yes [provider]  ACCU-CHEK GUIDE test strip 4 (four) times daily. 11/15/20   [provider]  Cholecalciferol 50 MCG (2000 UT) CAPS Take 2,000 Units by mouth daily. 08/24/20   [provider]  Continuous Blood Gluc Receiver (DEXCOM G6 RECEIVER) DEVI Use 1 Device continuously 03/08/21   [provider]  diphenhydrAMINE (BENADRYL) 25 MG tablet Take 50 mg by mouth every 6 (six) hours as needed for allergies.    [provider]  Emollient (OINTMENT BASE) OINT Apply 1 application topically daily as needed (dry skin). 06/23/21 06/23/22  [provider]  glucagon 1 MG injection Inject 1 mg into the muscle as needed (low blood sugar). 06/23/21   [provider]  insulin regular human CONCENTRATED (HUMULIN R U-500 KWIKPEN) 500 UNIT/ML KwikPen Inject 80-140 Units into the skin 2 (two) times daily with a meal. For dialysis days give 80 units in am, if tray comes early to go to dialysis early may given medication 1-2 hours earlier, page MD for questions - If BG < 70, treat per hypoglycemia protocol. - If BG < 70 or NPO or any other dosing concerns, page MD. Note  this insulin is 5 times as concentrated as regular insulin U-100. Verify dose and volume prior to administration. 0   CONCENTRATED insulin REGULAR (HUMULIN R U-500 "CONCENTRATED") 500 unit/mL injection 140 Units, Subcutaneous, User specified (Once per day on Mon Wed Fri Sun).  Instructions:   Non-Dialysis days give 140 units in the am - If NPO, hold dose. - If BG < 70, treat per hypoglycemia protocol. - If BG < 70 or any other dosing concerns, page MD. Note this insulin is 5 times as concentrated as regular insulin U-100. Verify dose and volume prior to administration. 0 08/31/21   Domenic Polite, MD  lidocaine (LIDODERM) 5 % Place 3 patches onto the skin daily. Remove & Discard patch within 12 hours or as directed by MD 08/17/21   Alma Friendly, MD  nortriptyline  (PAMELOR) 10 MG capsule Take 20 mg by mouth at bedtime. 10/23/19 02/22/22  [provider]  Nutritional Supplements (,FEEDING SUPPLEMENT, PROSOURCE PLUS) liquid Take 30 mLs by mouth 2 (two) times daily between meals. 04/11/21   Debbe Odea, MD  oxyCODONE-acetaminophen (PERCOCET/ROXICET) 5-325 MG tablet Take 1 tablet by mouth every 6 (six) hours as needed for moderate pain or severe pain. 08/31/21   Domenic Polite, MD  pantoprazole (PROTONIX) 20 MG tablet Take 20 mg by mouth daily. 02/09/21   [provider]  polyethylene glycol powder (GLYCOLAX/MIRALAX) 17 GM/SCOOP powder Take 17 g by mouth daily as needed for mild constipation. 08/24/20   [provider]  pregabalin (LYRICA) 25 MG capsule Take 1 capsule (25 mg total) by mouth 2 (two) times daily. 08/17/21   Alma Friendly, MD  senna-docusate (SENOKOT-S) 8.6-50 MG tablet Take 2 tablets by mouth 2 (two) times daily as needed for mild constipation. 10/23/19   [provider]  sertraline (ZOLOFT) 100 MG tablet Take 100 mg by mouth daily. 02/22/21   [provider]  torsemide (DEMADEX) 100 MG tablet Take 1 tablet (100 mg total) by mouth every Tuesday, Thursday, Saturday, and Sunday. 08/17/21   Alma Friendly, MD   Current Facility-Administered Medications  Medication Dose Route Frequency Provider Last Rate Last Admin   acetaminophen (TYLENOL) tablet 650 mg  650 mg Oral Q6H PRN Waynetta Sandy, MD       Or   acetaminophen (TYLENOL) suppository 650 mg  650 mg Rectal Q6H PRN Waynetta Sandy, MD       albuterol (PROVENTIL) (2.5 MG/3ML) 0.083% nebulizer solution 2.5 mg  2.5 mg Nebulization Q4H PRN Karmen Bongo, MD       amLODipine (NORVASC) tablet 5 mg  5 mg Oral QHS Waynetta Sandy, MD   5 mg at 09/01/21 2159   aspirin EC tablet 81 mg  81 mg Oral Daily Waynetta Sandy, MD   81 mg at 09/02/21 0942   atorvastatin (LIPITOR) tablet 40 mg  40 mg Oral Daily Waynetta Sandy, MD   40 mg at 09/02/21 [id number]   calcium acetate (PHOSLO) capsule 1,334 mg  1,334 mg Oral TID with meals Waynetta Sandy, MD   1,334 mg at 09/02/21 1248   calcium carbonate (dosed in mg elemental calcium) suspension 500 mg of elemental calcium  500 mg of elemental calcium Oral Q6H PRN Waynetta Sandy, MD       camphor-menthol [facility]) lotion 1 application  1 application Topical [id number] PRN Waynetta Sandy, MD       And   hydrOXYzine (ATARAX/VISTARIL) tablet 25 mg  25 mg Oral Q8H  PRN Waynetta Sandy, MD       carvedilol (COREG) tablet 12.5 mg  12.5 mg Oral BID WC Waynetta Sandy, MD   12.5 mg at 09/02/21 [id number]   Chlorhexidine Gluconate Cloth 2 % PADS 6 each  6 each Topical Daily Karmen Bongo, MD   6 each at 09/01/21 1817   diazepam (VALIUM) tablet 5 mg  5 mg Oral Q12H PRN Domenic Polite, MD   5 mg at 09/01/21 2204   feeding supplement (NEPRO CARB STEADY) liquid 237 mL  237 mL Oral TID PRN Waynetta Sandy, MD       heparin injection 5,000 Units  5,000 Units Subcutaneous Q8H Waynetta Sandy, MD   5,000 Units at 09/02/21 0940   hydrALAZINE (APRESOLINE) injection 5 mg  5 mg Intravenous Q4H PRN Waynetta Sandy, MD       HYDROmorphone (DILAUDID) injection 1 mg  1 mg Intravenous Q3H PRN Domenic Polite, MD   1 mg at 09/02/21 1425   insulin aspart (novoLOG) injection 0-6 Units  0-6 Units Subcutaneous TID WC Waynetta Sandy, MD   3 Units at 09/02/21 1248   insulin regular human CONCENTRATED (HUMULIN R) 500 UNIT/ML KwikPen 45 Units  45 Units Subcutaneous Q supper Donnamae Jude, [facility]   45 Units at 09/01/21 1816   insulin regular human CONCENTRATED (HUMULIN R) 500 UNIT/ML KwikPen 60 Units  60 Units Subcutaneous Q breakfast Domenic Polite, MD   60 Units at 09/02/21 0940   nortriptyline (PAMELOR) capsule 20 mg  20 mg Oral QHS Waynetta Sandy, MD   20 mg at 09/01/21 2159   ondansetron (ZOFRAN) tablet  4 mg  4 mg Oral Q6H PRN Waynetta Sandy, MD   4 mg at 09/02/21 1425   Or   ondansetron (ZOFRAN) injection 4 mg  4 mg Intravenous Q6H PRN Waynetta Sandy, MD   4 mg at 09/01/21 1945   oxyCODONE-acetaminophen (PERCOCET/ROXICET) 5-325 MG per tablet 1-2 tablet  1-2 tablet Oral Q6H PRN Waynetta Sandy, MD   1 tablet at 09/01/21 1945   pantoprazole (PROTONIX) EC tablet 20 mg  20 mg Oral Daily Waynetta Sandy, MD   20 mg at 09/02/21 [id number]   polyethylene glycol (MIRALAX / GLYCOLAX) packet 17 g  17 g Oral Daily PRN Karmen Bongo, MD       pregabalin (LYRICA) capsule 25 mg  25 mg Oral BID Waynetta Sandy, MD   25 mg at 09/02/21 [id number]   senna-docusate (Senokot-S) tablet 2 tablet  2 tablet Oral BID PRN Waynetta Sandy, MD       sertraline (ZOLOFT) tablet 100 mg  100 mg Oral Daily Waynetta Sandy, MD   100 mg at 09/02/21 [id number]   zolpidem (AMBIEN) tablet 5 mg  5 mg Oral QHS PRN Waynetta Sandy, MD   5 mg at 09/02/21 [id number]   Labs: Basic Metabolic Panel: Recent Labs  Lab 08/31/21 0353 09/01/21 1334 09/02/21 0212  NA 136 136 134*  K 4.5 3.9 3.7  CL 102 96* 94*  CO2 '24 28 27  '$ GLUCOSE 375* 129* 74  BUN 61* 42* 30*  CREATININE 2.82* 3.44* 3.67*  CALCIUM 8.6* 8.5* 9.3   Liver Function Tests: Recent Labs  Lab 08/30/21 1045 09/01/21 1334 09/02/21 0212  AST '16 16 18  '$ ALT '18 17 14  '$ ALKPHOS 77 92 106  BILITOT 0.4 0.1* 0.4  PROT 6.3* 6.7 7.9  ALBUMIN 3.2* 3.3* 4.0   CBC: Recent  Labs  Lab 08/30/21 1045 08/31/21 0353 09/01/21 1334 09/02/21 0212  WBC 6.2 6.3 8.2 8.2  NEUTROABS 4.5  --   --   --   HGB 9.0* 8.5* 9.1* 11.0*  HCT 29.1* 27.2* 29.6* 35.1*  MCV 90.1 88.0 90.0 88.4  PLT 130* 125* 131* 129*    Dialysis Orders:  MWF at [facility] 3:45hr, 450/A1.5, EDW 122kg, 2K/2.5Ca, TDC, heparin 2000 - Mircera 22mg IV q 2 wks (last 9/22)   Assessment/Plan: 1. S/p RUE fistulogram + innomonate vein angioplasty 10/3: edema  improving. 2. ESRD: Continue HD per usual MWF schedule - HD tomorrow. 3. HTN/volume: Variable BP - UF as tolerated - unclear if weights correct, if so he is up 9kg. Cramping with HD yesterday. 4. Anemia: Hgb 11 - s/p Aranesp 106m on 10/5. 5. Secondary hyperparathyroidism: Ca ok, continue home Phoslo 6. T2DM 7. Vomiting: Unclear etiology, for abdominal CT today.  KaVeneta PentonPA-C 09/02/2021, 2:44 PM  [employer]

## 2021-09-02 NOTE — Progress Notes (Signed)
[facility]  PROGRESS NOTE  Jeffrey [name] [id number] DOB: Oct 09, 1961 DOA: 08/30/2021 PCP: Pcp, No   Brief HPI:   60 y.o. male with medical history significant of COPD; DM on complex regimen of U5 100 insulin; HTN; PVD s/p L AKA with h/o MRSA; and ESRD on MWF HD presenting with RUE edema s/p AV graft placement.   He had the graft placed during last admission and his arm is swollen and painful.  It has been swollen since placement and it has been slightly painful but now worse.  He missed HD last Friday because the "tech was real snotty and I wasn't in the mood for her."  He did have a full session last Wednesday.  Underwent repair of right AV graft, was supposed to be discharged to skilled nursing facility Developed nausea and vomiting so discharge was held.    Subjective   Patient denies vomiting this morning, but says that he had multiple episodes last night.  CT abdomen/pelvis was ordered however could not be performed due to vomiting.   Assessment/Plan:     Intractable nausea and vomiting -Unclear etiology -Abdomen x-ray two-view obtained today is normal -Continue clear liquid diet -Start Protonix 40 mg p.o. twice daily -CT abdomen/pelvis is ordered and currently pending  RUE edema, AV graft malfunction -Patient presented with pain and swelling at the site of recently placed right upper extremity AV graft  -Seen by vascular surgery Dr. Donzetta Matters, underwent shuntogram followed by balloon angioplasty of right innominate vein on 10/3   ESRD on HD -Patient is on hemodialysis Monday Wednesday Friday -Missed hemodialysis on Monday and last Friday -Access issues as noted above, has left TDC in place for HD -On metolazone and torsemide at baseline -We will resume on discharge  Hypertension -Continue Norvasc, Coreg  Diabetes mellitus type 2 -Continue 500 -CBG well controlled  Depression -Continue Zoloft, nortriptyline  COPD -Continue butyryl as needed  S/p left  AKA -Continue aspirin, Lyrica       Scheduled medications:    amLODipine  5 mg Oral QHS   aspirin EC  81 mg Oral Daily   atorvastatin  40 mg Oral Daily   calcium acetate  1,334 mg Oral TID with meals   carvedilol  12.5 mg Oral BID WC   Chlorhexidine Gluconate Cloth  6 each Topical Daily   heparin  5,000 Units Subcutaneous Q8H   insulin aspart  0-6 Units Subcutaneous TID WC   insulin regular human CONCENTRATED  45 Units Subcutaneous Q supper   insulin regular human CONCENTRATED  60 Units Subcutaneous Q breakfast   nortriptyline  20 mg Oral QHS   pantoprazole  40 mg Oral BID   pregabalin  25 mg Oral BID   sertraline  100 mg Oral Daily     Data Reviewed:   CBG:  Recent Labs  Lab 09/01/21 1701 09/01/21 2046 09/02/21 0741 09/02/21 1124 09/02/21 1619  GLUCAP 115* 168* 97 258* 99    SpO2: 92 % O2 Flow Rate (L/min): 2 L/min    Vitals:   09/02/21 0935 09/02/21 1732 09/02/21 1735 09/02/21 2029  BP: (!) 143/61 (!) 180/83 (!) 180/83 133/65  Pulse: 99 89  84  Resp: 18   18  Temp: 98.1 F (36.7 C)   98.4 F (36.9 C)  TempSrc: Oral   Oral  SpO2: 92%   92%  Weight:      Height:         Intake/Output Summary (Last 24 hours) at 09/02/2021 2034 Last  data filed at 09/02/2021 1511 Gross per 24 hour  Intake --  Output 200 ml  Net -200 ml    10/05 0701 - 10/06 1900 In: -  Out: 2677 [Urine:200]  Filed Weights   08/31/21 1537 09/01/21 1356 09/01/21 1632  Weight: 131.2 kg 128.7 kg 128.2 kg    Data Reviewed: Basic Metabolic Panel: Recent Labs  Lab 08/30/21 1045 08/31/21 0353 09/01/21 1334 09/02/21 0212  NA 137 136 136 134*  K 4.0 4.5 3.9 3.7  CL 102 102 96* 94*  CO2 '24 24 28 27  '$ GLUCOSE 148* 375* 129* 74  BUN 63* 61* 42* 30*  CREATININE 3.00* 2.82* 3.44* 3.67*  CALCIUM 8.6* 8.6* 8.5* 9.3   Liver Function Tests: Recent Labs  Lab 08/30/21 1045 09/01/21 1334 09/02/21 0212  AST '16 16 18  '$ ALT '18 17 14  '$ ALKPHOS 77 92 106  BILITOT 0.4 0.1* 0.4  PROT  6.3* 6.7 7.9  ALBUMIN 3.2* 3.3* 4.0   No results for input(s): LIPASE, AMYLASE in the last 168 hours. No results for input(s): AMMONIA in the last 168 hours. CBC: Recent Labs  Lab 08/30/21 1045 08/31/21 0353 09/01/21 1334 09/02/21 0212  WBC 6.2 6.3 8.2 8.2  NEUTROABS 4.5  --   --   --   HGB 9.0* 8.5* 9.1* 11.0*  HCT 29.1* 27.2* 29.6* 35.1*  MCV 90.1 88.0 90.0 88.4  PLT 130* 125* 131* 129*   Cardiac Enzymes: No results for input(s): CKTOTAL, CKMB, CKMBINDEX, TROPONINI in the last 168 hours. BNP (last 3 results) No results for input(s): BNP in the last 8760 hours.  ProBNP (last 3 results) No results for input(s): PROBNP in the last 8760 hours.  CBG: Recent Labs  Lab 09/01/21 1701 09/01/21 2046 09/02/21 0741 09/02/21 1124 09/02/21 1619  GLUCAP 115* 168* 97 258* 99       Radiology Reports  DG Abd 2 Views  Result Date: 09/02/2021 CLINICAL DATA:  Vomiting EXAM: ABDOMEN - 2 VIEW COMPARISON:  None. FINDINGS: The bowel gas pattern is normal. There is no evidence of free air. No radio-opaque calculi or other significant radiographic abnormality is seen. IMPRESSION: Negative. Electronically Signed   By: Fidela Salisbury M.D.   On: 09/02/2021 16:40       Antibiotics: Anti-infectives (From admission, onward)    None         DVT prophylaxis: Heparin  Code Status: Full code  Family Communication: No family at bedside   Consultants: Nephrology  Procedures: Procedure -Pre-operative Diagnosis: End-stage renal disease: Malfunction right upper arm AV graft Post-operative diagnosis:  Same Surgeon:  Eda Paschal. Donzetta Matters, MD Procedure Performed: 1.  Ultrasound-guided cannulation right arm AV graft 2.  Right upper extremity shuntogram 3.  Drug-coated balloon angioplasty right innominate vein with 12 mm Lutonics 4.  Moderate sedation with fentanyl and Versed for 41 minutes    Objective    Physical Examination:   General-appears in no acute  distress Heart-S1-S2, regular, no murmur auscultated Lungs-clear to auscultation bilaterally, no wheezing or crackles auscultated Abdomen-soft, nontender, no organomegaly Extremities-no edema in the lower extremities, left AKA Neuro-alert, oriented x3, no focal deficit noted  Status is: Inpatient  Dispo: The patient is from: Skilled nursing facility              Anticipated d/c is to: SNF              Anticipated d/c date is: 09/06/2021  Patient currently not stable for discharge  Barrier to discharge-intractable nausea and vomiting  COVID-19 Labs  No results for input(s): DDIMER, FERRITIN, LDH, CRP in the last 72 hours.  Lab Results  Component Value Date   SARSCOV2NAA NEGATIVE 08/30/2021   SARSCOV2NAA NEGATIVE 08/16/2021   [place] NEGATIVE 08/04/2021   [place] NEGATIVE 04/13/2021            Recent Results (from the past 240 hour(s))  Resp Panel by RT-PCR (Flu A&B, Covid) Nasopharyngeal Swab     Status: None   Collection Time: 08/30/21 12:54 PM   Specimen: Nasopharyngeal Swab; Nasopharyngeal(NP) swabs in vial transport medium  Result Value Ref Range Status   SARS Coronavirus 2 by RT PCR NEGATIVE NEGATIVE Final    Comment: (NOTE) SARS-CoV-2 target nucleic acids are NOT DETECTED.  The SARS-CoV-2 RNA is generally detectable in upper respiratory specimens during the acute phase of infection. The lowest concentration of SARS-CoV-2 viral copies this assay can detect is 138 copies/mL. A negative result does not preclude SARS-Cov-2 infection and should not be used as the sole basis for treatment or other patient management decisions. A negative result may occur with  improper specimen collection/handling, submission of specimen other than nasopharyngeal swab, presence of viral mutation(s) within the areas targeted by this assay, and inadequate number of viral copies(<138 copies/mL). A negative result must be combined with clinical observations,  patient history, and epidemiological information. The expected result is Negative.  Fact Sheet for Patients:  EntrepreneurPulse.com.au  Fact Sheet for Healthcare Providers:  IncredibleEmployment.be  This test is no t yet approved or cleared by the [place] FDA and  has been authorized for detection and/or diagnosis of SARS-CoV-2 by FDA under an Emergency Use Authorization (EUA). This EUA will remain  in effect (meaning this test can be used) for the duration of the COVID-19 declaration under Section 564(b)(1) of the Act, 21 U.S.C.section 360bbb-3(b)(1), unless the authorization is terminated  or revoked sooner.       Influenza A by PCR NEGATIVE NEGATIVE Final   Influenza B by PCR NEGATIVE NEGATIVE Final    Comment: (NOTE) The Xpert Xpress SARS-CoV-2/FLU/RSV plus assay is intended as an aid in the diagnosis of influenza from Nasopharyngeal swab specimens and should not be used as a sole basis for treatment. Nasal washings and aspirates are unacceptable for Xpert Xpress SARS-CoV-2/FLU/RSV testing.  Fact Sheet for Patients: EntrepreneurPulse.com.au  Fact Sheet for Healthcare Providers: IncredibleEmployment.be  This test is not yet approved or cleared by the [place] FDA and has been authorized for detection and/or diagnosis of SARS-CoV-2 by FDA under an Emergency Use Authorization (EUA). This EUA will remain in effect (meaning this test can be used) for the duration of the COVID-19 declaration under Section 564(b)(1) of the Act, 21 U.S.C. section 360bbb-3(b)(1), unless the authorization is terminated or revoked.  Performed at [facility], [place] [address], [place], [place] [address]     [facility] If 7PM-7AM, please contact night-coverage at [email or web address], Office  [phone]   09/02/2021, 8:34 PM  LOS: 1 day

## 2021-09-03 ENCOUNTER — Inpatient Hospital Stay (HOSPITAL_COMMUNITY): Payer: Medicare Other

## 2021-09-03 DIAGNOSIS — N186 End stage renal disease: Secondary | ICD-10-CM | POA: Diagnosis not present

## 2021-09-03 DIAGNOSIS — R112 Nausea with vomiting, unspecified: Secondary | ICD-10-CM | POA: Diagnosis not present

## 2021-09-03 DIAGNOSIS — T82590A Other mechanical complication of surgically created arteriovenous fistula, initial encounter: Secondary | ICD-10-CM | POA: Diagnosis not present

## 2021-09-03 DIAGNOSIS — E1159 Type 2 diabetes mellitus with other circulatory complications: Secondary | ICD-10-CM | POA: Diagnosis not present

## 2021-09-03 LAB — GLUCOSE, CAPILLARY
Glucose-Capillary: 71 mg/dL (ref 70–99)
Glucose-Capillary: 156 mg/dL — ABNORMAL HIGH (ref 70–99)
Glucose-Capillary: 304 mg/dL — ABNORMAL HIGH (ref 70–99)
Glucose-Capillary: 321 mg/dL — ABNORMAL HIGH (ref 70–99)

## 2021-09-03 LAB — RESP PANEL BY RT-PCR (FLU A&B, COVID) ARPGX2
Influenza A by PCR: NEGATIVE
Influenza B by PCR: NEGATIVE
SARS Coronavirus 2 by RT PCR: NEGATIVE

## 2021-09-03 MED ORDER — BISACODYL 10 MG RE SUPP
10.0000 mg | Freq: Once | RECTAL | Status: AC
  Administered 2021-09-04: 10 mg via RECTAL
  Filled 2021-09-03: qty 1

## 2021-09-03 NOTE — TOC Progression Note (Signed)
Transition of Care [facility]) - Progression Note    Patient Details  Name: Jeffrey [name] MRN: [id number] Date of Birth: 12-21-1960  Transition of Care [facility]) CM/SW [place], [place] Phone Number: 09/03/2021, 1:03 PM  Clinical Narrative:     CSW started insurance authorization for patient. [place] ID# [id number]. Patient has SNF bed at [facility]. Insurance authorization is pending. CSW will continue to follow and assist with patients dc planning needs.  Expected Discharge Plan: [place] Barriers to Discharge: No Barriers Identified  Expected Discharge Plan and Services Expected Discharge Plan: [place] In-house Referral: Clinical Social Work     Living arrangements for the past 2 months: [place] Expected Discharge Date: 08/31/21                                     Social Determinants of Health (SDOH) Interventions    Readmission Risk Interventions Readmission Risk Prevention Plan 04/07/2021  Transportation Screening Complete  Medication Review Press photographer) Referral to Pharmacy  PCP or Specialist appointment within 3-5 days of discharge Complete  HRI or Home Care Consult Complete  SW Recovery Care/Counseling Consult Complete  Palliative Care Screening Not [place] Complete

## 2021-09-03 NOTE — Progress Notes (Signed)
[facility] Progress Note   Subjective:  Seen on HD today - 2.5L UFG. No vomiting this morning, no dyspnea. Refuses to run more than 3hr HD today.  Objective Vitals:   09/03/21 0830 09/03/21 0900 09/03/21 0930 09/03/21 1000  BP: 137/81 (!) 143/77 120/71 133/64  Pulse:      Resp: '19 17 16 15  '$ Temp:      TempSrc:      SpO2:      Weight:      Height:       Physical Exam General: Obese man, NAD. Nasal O2 in place Heart: RRR; no murmur Lungs: CTA anteriorly Abdomen: soft, non-tender Extremities: No LE edema Dialysis Access: [facility]  Additional Objective Labs: Basic Metabolic Panel: Recent Labs  Lab 08/31/21 0353 09/01/21 1334 09/02/21 0212  NA 136 136 134*  K 4.5 3.9 3.7  CL 102 96* 94*  CO2 '24 28 27  '$ GLUCOSE 375* 129* 74  BUN 61* 42* 30*  CREATININE 2.82* 3.44* 3.67*  CALCIUM 8.6* 8.5* 9.3   Liver Function Tests: Recent Labs  Lab 08/30/21 1045 09/01/21 1334 09/02/21 0212  AST '16 16 18  '$ ALT '18 17 14  '$ ALKPHOS 77 92 106  BILITOT 0.4 0.1* 0.4  PROT 6.3* 6.7 7.9  ALBUMIN 3.2* 3.3* 4.0   CBC: Recent Labs  Lab 08/30/21 1045 08/31/21 0353 09/01/21 1334 09/02/21 0212  WBC 6.2 6.3 8.2 8.2  NEUTROABS 4.5  --   --   --   HGB 9.0* 8.5* 9.1* 11.0*  HCT 29.1* 27.2* 29.6* 35.1*  MCV 90.1 88.0 90.0 88.4  PLT 130* 125* 131* 129*   Studies/Results: DG Abd 2 Views  Result Date: 09/02/2021 CLINICAL DATA:  Vomiting EXAM: ABDOMEN - 2 VIEW COMPARISON:  None. FINDINGS: The bowel gas pattern is normal. There is no evidence of free air. No radio-opaque calculi or other significant radiographic abnormality is seen. IMPRESSION: Negative. Electronically Signed   By: Fidela Salisbury M.D.   On: 09/02/2021 16:40    Medications:   amLODipine  5 mg Oral QHS   aspirin EC  81 mg Oral Daily   atorvastatin  40 mg Oral Daily   calcium acetate  1,334 mg Oral TID with meals   carvedilol  12.5 mg Oral BID WC   Chlorhexidine Gluconate Cloth  6 each Topical Daily    heparin  5,000 Units Subcutaneous Q8H   insulin aspart  0-6 Units Subcutaneous TID WC   insulin regular human CONCENTRATED  45 Units Subcutaneous Q supper   insulin regular human CONCENTRATED  60 Units Subcutaneous Q breakfast   nortriptyline  20 mg Oral QHS   pantoprazole  40 mg Oral BID   pregabalin  25 mg Oral BID   sertraline  100 mg Oral Daily    Dialysis Orders: MWF at [facility] 3:45hr, 450/A1.5, EDW 122kg, 2K/2.5Ca, TDC, heparin 2000 - Mircera 88mg IV q 2 wks (last 9/22)   Assessment/Plan: 1. S/p RUE fistulogram + innomonate vein angioplasty 10/3: edema improving. 2. ESRD: Continue HD per usual MWF schedule - HD today. 3K bath for K 3.7. 3. HTN/volume: Variable BP - UF as tolerated - unclear if weights correct, if so he is up 9kg. Cramping with last HD. 2.5L UFG today. 4. Anemia: Hgb 11 - s/p Aranesp 1066m on 10/5. 5. Secondary hyperparathyroidism: Ca ok, continue home Phos[place] 6. T2DM 7. Vomiting: Unclear etiology, KUB 10/6 negative. Started on PPI BID. CT pending.  KaVeneta PentonPA-C 09/03/2021, 10:25 AM  [facility]

## 2021-09-03 NOTE — Progress Notes (Signed)
PT Cancellation Note  Patient Details Name: Jeffrey [name] MRN: [id number] DOB: 23-May-1961   Cancelled Treatment:    Reason Eval/Treat Not Completed: Patient at procedure or test/unavailable. Attempted to see pt at 8:05 am but pt already in HD. Will plan to follow-up later today once he returns to his room.   [name], PT, DPT Acute Rehabilitation Services  Pager: [phone] Office: [place] 09/03/2021, 8:43 AM

## 2021-09-03 NOTE — Progress Notes (Signed)
[facility]  PROGRESS NOTE  Jeffrey [name] [id number] DOB: 1961/05/03 DOA: 08/30/2021 PCP: Pcp, No   Brief HPI:   60 y.o. male with medical history significant of COPD; DM on complex regimen of U5 100 insulin; HTN; PVD s/p L AKA with h/o MRSA; and ESRD on MWF HD presenting with RUE edema s/p AV graft placement.   He had the graft placed during last admission and his arm is swollen and painful.  It has been swollen since placement and it has been slightly painful but now worse.  He missed HD last Friday because the "tech was real snotty and I wasn't in the mood for her."  He did have a full session last Wednesday.  Underwent repair of right AV graft, was supposed to be discharged to skilled nursing facility Developed nausea and vomiting so discharge was held.    Subjective   Vomiting has resolved, he refused CT abdomen/pelvis.  Abdominal x-ray did not show any significant abnormality.  Still has not had BM.  This morning he coughed up a clot of blood.  Denies shortness of breath.   Assessment/Plan:     Intractable nausea and vomiting -Unclear etiology; resolved -Abdomen x-ray two-view obtained today is normal -Advance to regular diet -Start Protonix 40 mg p.o. twice daily -CT abdomen/pelvis is ordered and currently pending  ?  Hemoptysis -Patient coughed up a clot of blood this morning -We will obtain chest x-ray two-view    RUE edema, AV graft malfunction -Patient presented with pain and swelling at the site of recently placed right upper extremity AV graft  -Seen by vascular surgery Dr. Donzetta Matters, underwent shuntogram followed by balloon angioplasty of right innominate vein on 10/3   ESRD on HD -Patient is on hemodialysis Monday Wednesday Friday -Missed hemodialysis on Monday and last Friday -Access issues as noted above, has left TDC in place for HD -On metolazone and torsemide at baseline -We will resume on discharge  Hypertension -Continue Norvasc,  Coreg  Diabetes mellitus type 2 -Continue 500 -CBG well controlled  Depression -Continue Zoloft, nortriptyline  COPD -Continue butyryl as needed  S/p left AKA -Continue aspirin, Lyrica       Scheduled medications:    amLODipine  5 mg Oral QHS   aspirin EC  81 mg Oral Daily   atorvastatin  40 mg Oral Daily   bisacodyl  10 mg Rectal Once   calcium acetate  1,334 mg Oral TID with meals   carvedilol  12.5 mg Oral BID WC   Chlorhexidine Gluconate Cloth  6 each Topical Daily   heparin  5,000 Units Subcutaneous Q8H   insulin aspart  0-6 Units Subcutaneous TID WC   insulin regular human CONCENTRATED  45 Units Subcutaneous Q supper   insulin regular human CONCENTRATED  60 Units Subcutaneous Q breakfast   nortriptyline  20 mg Oral QHS   pantoprazole  40 mg Oral BID   pregabalin  25 mg Oral BID   sertraline  100 mg Oral Daily     Data Reviewed:   CBG:  Recent Labs  Lab 09/02/21 0741 09/02/21 1124 09/02/21 1619 09/02/21 2118 09/03/21 0100  GLUCAP 97 258* 99 148* 71    SpO2: 97 % O2 Flow Rate (L/min): 2 L/min    Vitals:   09/03/21 1030 09/03/21 1050 09/03/21 1100 09/03/21 1114  BP: 109/61 125/60 (!) 130/59 (!) 132/50  Pulse:   80   Resp: '19 16 17 17  '$ Temp:   97.9 F (36.6 C) 97.7 F (  36.5 C)  TempSrc:   Oral Oral  SpO2:   98% 97%  Weight:   127.5 kg   Height:         Intake/Output Summary (Last 24 hours) at 09/03/2021 1128 Last data filed at 09/03/2021 1100 Gross per 24 hour  Intake 120 ml  Output 3250 ml  Net -3130 ml    10/05 1901 - 10/07 0700 In: 120 [P.O.:120] Out: 200 [Urine:200]  Filed Weights   09/01/21 1632 09/03/21 0745 09/03/21 1100  Weight: 128.2 kg 130 kg 127.5 kg    Data Reviewed: Basic Metabolic Panel: Recent Labs  Lab 08/30/21 1045 08/31/21 0353 09/01/21 1334 09/02/21 0212  NA 137 136 136 134*  K 4.0 4.5 3.9 3.7  CL 102 102 96* 94*  CO2 '24 24 28 27  '$ GLUCOSE 148* 375* 129* 74  BUN 63* 61* 42* 30*  CREATININE 3.00*  2.82* 3.44* 3.67*  CALCIUM 8.6* 8.6* 8.5* 9.3   Liver Function Tests: Recent Labs  Lab 08/30/21 1045 09/01/21 1334 09/02/21 0212  AST '16 16 18  '$ ALT '18 17 14  '$ ALKPHOS 77 92 106  BILITOT 0.4 0.1* 0.4  PROT 6.3* 6.7 7.9  ALBUMIN 3.2* 3.3* 4.0   No results for input(s): LIPASE, AMYLASE in the last 168 hours. No results for input(s): AMMONIA in the last 168 hours. CBC: Recent Labs  Lab 08/30/21 1045 08/31/21 0353 09/01/21 1334 09/02/21 0212  WBC 6.2 6.3 8.2 8.2  NEUTROABS 4.5  --   --   --   HGB 9.0* 8.5* 9.1* 11.0*  HCT 29.1* 27.2* 29.6* 35.1*  MCV 90.1 88.0 90.0 88.4  PLT 130* 125* 131* 129*   Cardiac Enzymes: No results for input(s): CKTOTAL, CKMB, CKMBINDEX, TROPONINI in the last 168 hours. BNP (last 3 results) No results for input(s): BNP in the last 8760 hours.  ProBNP (last 3 results) No results for input(s): PROBNP in the last 8760 hours.  CBG: Recent Labs  Lab 09/02/21 0741 09/02/21 1124 09/02/21 1619 09/02/21 2118 09/03/21 0100  GLUCAP 97 258* 99 148* 71       Radiology Reports  DG Abd 2 Views  Result Date: 09/02/2021 CLINICAL DATA:  Vomiting EXAM: ABDOMEN - 2 VIEW COMPARISON:  None. FINDINGS: The bowel gas pattern is normal. There is no evidence of free air. No radio-opaque calculi or other significant radiographic abnormality is seen. IMPRESSION: Negative. Electronically Signed   By: Fidela Salisbury M.D.   On: 09/02/2021 16:40       Antibiotics: Anti-infectives (From admission, onward)    None         DVT prophylaxis: Heparin  Code Status: Full code  Family Communication: No family at bedside   Consultants: Nephrology  Procedures: Procedure -Pre-operative Diagnosis: End-stage renal disease: Malfunction right upper arm AV graft Post-operative diagnosis:  Same Surgeon:  Eda Paschal. Donzetta Matters, MD Procedure Performed: 1.  Ultrasound-guided cannulation right arm AV graft 2.  Right upper extremity shuntogram 3.  Drug-coated balloon  angioplasty right innominate vein with 12 mm Lutonics 4.  Moderate sedation with fentanyl and Versed for 41 minutes    Objective    Physical Examination:   General-appears in no acute distress Heart-S1-S2, regular, no murmur auscultated Lungs-clear to auscultation bilaterally, no wheezing or crackles auscultated Abdomen-soft, nontender, no organomegaly Extremities-1+ edema in right lower extremity, left AKA  Neuro-alert, oriented x3, no focal deficit noted  Status is: Inpatient  Dispo: The patient is from: Skilled nursing facility  Anticipated d/c is to: SNF              Anticipated d/c date is: 09/04/2021              Patient currently not stable for discharge  Barrier to discharge-intractable nausea and vomiting  COVID-19 Labs  No results for input(s): DDIMER, FERRITIN, LDH, CRP in the last 72 hours.  Lab Results  Component Value Date   SARSCOV2NAA NEGATIVE 08/30/2021   SARSCOV2NAA NEGATIVE 08/16/2021   [place] NEGATIVE 08/04/2021   [place] NEGATIVE 04/13/2021            Recent Results (from the past 240 hour(s))  Resp Panel by RT-PCR (Flu A&B, Covid) Nasopharyngeal Swab     Status: None   Collection Time: 08/30/21 12:54 PM   Specimen: Nasopharyngeal Swab; Nasopharyngeal(NP) swabs in vial transport medium  Result Value Ref Range Status   SARS Coronavirus 2 by RT PCR NEGATIVE NEGATIVE Final    Comment: (NOTE) SARS-CoV-2 target nucleic acids are NOT DETECTED.  The SARS-CoV-2 RNA is generally detectable in upper respiratory specimens during the acute phase of infection. The lowest concentration of SARS-CoV-2 viral copies this assay can detect is 138 copies/mL. A negative result does not preclude SARS-Cov-2 infection and should not be used as the sole basis for treatment or other patient management decisions. A negative result may occur with  improper specimen collection/handling, submission of specimen other than nasopharyngeal swab,  presence of viral mutation(s) within the areas targeted by this assay, and inadequate number of viral copies(<138 copies/mL). A negative result must be combined with clinical observations, patient history, and epidemiological information. The expected result is Negative.  Fact Sheet for Patients:  EntrepreneurPulse.com.au  Fact Sheet for Healthcare Providers:  IncredibleEmployment.be  This test is no t yet approved or cleared by the [place] FDA and  has been authorized for detection and/or diagnosis of SARS-CoV-2 by FDA under an Emergency Use Authorization (EUA). This EUA will remain  in effect (meaning this test can be used) for the duration of the COVID-19 declaration under Section 564(b)(1) of the Act, 21 U.S.C.section 360bbb-3(b)(1), unless the authorization is terminated  or revoked sooner.       Influenza A by PCR NEGATIVE NEGATIVE Final   Influenza B by PCR NEGATIVE NEGATIVE Final    Comment: (NOTE) The Xpert Xpress SARS-CoV-2/FLU/RSV plus assay is intended as an aid in the diagnosis of influenza from Nasopharyngeal swab specimens and should not be used as a sole basis for treatment. Nasal washings and aspirates are unacceptable for Xpert Xpress SARS-CoV-2/FLU/RSV testing.  Fact Sheet for Patients: EntrepreneurPulse.com.au  Fact Sheet for Healthcare Providers: IncredibleEmployment.be  This test is not yet approved or cleared by the [place] FDA and has been authorized for detection and/or diagnosis of SARS-CoV-2 by FDA under an Emergency Use Authorization (EUA). This EUA will remain in effect (meaning this test can be used) for the duration of the COVID-19 declaration under Section 564(b)(1) of the Act, 21 U.S.C. section 360bbb-3(b)(1), unless the authorization is terminated or revoked.  Performed at [facility], [place] [address], [place], [place] [address]     [facility] If 7PM-7AM, please contact night-coverage at [email or web address], Office  [phone]   09/03/2021, 11:28 AM  LOS: 2 days

## 2021-09-03 NOTE — Progress Notes (Signed)
Alerted by CSW that pt may be stable for d/c to snf tomorrow. Contacted clinic ([facility]) and spoke to [place] regarding pt's possible d/c tomorrow and pt to resume out-pt on Monday. Elberta Fortis confirms pt needs to arrive at 10:30 for 10:50 chair time.   Melven Sartorius Renal Navigator [phone]

## 2021-09-03 NOTE — Progress Notes (Signed)
    His right hand is much improved he is using to eat at the time of evaluation.  Right upper arm graft with flow.  Vascular will be available as needed.   Manly Nestle C. Donzetta Matters, MD [facility] [place] Office: [phone] Pager: [phone]

## 2021-09-03 NOTE — Progress Notes (Signed)
Physical Therapy Treatment Patient Details Name: Jeffrey [name] MRN: [id number] DOB: 1961-03-04 Today's Date: 09/03/2021   History of Present Illness Pt is a 60 y/o male admitted 08/30/21 secondary to R AV graft malfunction. Pt is s/p vein angioplasty on 10/3. PMH includes L AKA, ESRD on HD, CHF, COPD, DM, HTN.    PT Comments    Pt initially slightly agitated and not wanting to get up but eventually agreeable to session. Pt able to progress from needing minA to come to stand from recliner on first rep to only needing min guard assist with a subsequent rep. However, he requires several attempts to be successful due to balance deficits and R quads and gluts weakness. Performed standing mini-squats with RW to address these deficits. Educated pt on importance of regular mobility and to continue exercising legs since pt's goal is to eventually walk with a prosthesis. Pt verbalized understanding. Will continue to follow acutely. Current recommendations remain appropriate.   Recommendations for follow up therapy are one component of a multi-disciplinary discharge planning process, led by the attending physician.  Recommendations may be updated based on patient status, additional functional criteria and insurance authorization.  Follow Up Recommendations  SNF     Equipment Recommendations  None recommended by PT    Recommendations for Other Services       Precautions / Restrictions Precautions Precautions: Fall Precaution Comments: L AKA Restrictions Weight Bearing Restrictions: No     Mobility  Bed Mobility               General bed mobility comments: Pt up in recliner upon arrival.    Transfers Overall transfer level: Needs assistance Equipment used: Rolling walker (2 wheeled) Transfers: Sit to/from Stand Sit to Stand: Min guard;Min assist         General transfer comment: Sit to stand 2x from recliner, cuing for hand placement. Pt needing minA first rep due to posterior  LOB, but min guard after x2 attempts to successfully come to stand 2nd rep.  Ambulation/Gait             General Gait Details: NT   Stairs             Wheelchair Mobility    Modified Rankin (Stroke Patients Only)       Balance Overall balance assessment: Needs assistance Sitting-balance support: No upper extremity supported;Feet supported Sitting balance-Leahy Scale: Fair     Standing balance support: Bilateral upper extremity supported Standing balance-Leahy Scale: Poor Standing balance comment: Bil UE support for standing.                            Cognition Arousal/Alertness: Awake/alert Behavior During Therapy: Agitated Overall Cognitive Status: No family/caregiver present to determine baseline cognitive functioning                                 General Comments: Pt can be particular about how tasks are performed and assistance provided. Pt refusing gait belt. Pt agitated stating "I just want to be left alone" but eventually agreeable to PT session once educated on importance of continued mobility.      Exercises General Exercises - Lower Extremity Hip ABduction/ADduction: Left;Strengthening;10 reps;Seated Mini-Sqauts: Strengthening;Right;10 reps;Standing (x2 sets of 10 reps with UE support on RW)    General Comments General comments (skin integrity, edema, etc.): Pt denied dizziness throughout, refused gait belt  Pertinent Vitals/Pain Pain Assessment: Faces Faces Pain Scale: Hurts a little bit Pain Location: generalized grimacing with mobility Pain Descriptors / Indicators: Grimacing Pain Intervention(s): Monitored during session;Limited activity within patient's tolerance;Repositioned    Home Living                      Prior Function            PT Goals (current goals can now be found in the care plan section) Acute Rehab PT Goals Patient Stated Goal: to be able to relax PT Goal Formulation: With  patient Time For Goal Achievement: 09/14/21 Potential to Achieve Goals: Fair Progress towards PT goals: Progressing toward goals    Frequency    Min 2X/week      PT Plan Current plan remains appropriate    Co-evaluation              AM-PAC PT "6 Clicks" Mobility   Outcome Measure  Help needed turning from your back to your side while in a flat bed without using bedrails?: A Little Help needed moving from lying on your back to sitting on the side of a flat bed without using bedrails?: A Little Help needed moving to and from a bed to a chair (including a wheelchair)?: A Little Help needed standing up from a chair using your arms (e.g., wheelchair or bedside chair)?: A Little Help needed to walk in hospital room?: Total Help needed climbing 3-5 steps with a railing? : Total 6 Click Score: 14    End of Session   Activity Tolerance: Treatment limited secondary to agitation Patient left: in chair;with call bell/phone within reach Nurse Communication: Mobility status PT Visit Diagnosis: Other abnormalities of gait and mobility (R26.89);Difficulty in walking, not elsewhere classified (R26.2);Muscle weakness (generalized) (M62.81);Unsteadiness on feet (R26.81)     Time: 1210-1220 PT Time Calculation (min) (ACUTE ONLY): 10 min  Charges:  $Therapeutic Exercise: 8-22 mins                     [name], PT, DPT Acute Rehabilitation Services  Pager: [phone] Office: [place] 09/03/2021, 12:30 PM

## 2021-09-04 LAB — GLUCOSE, CAPILLARY
Glucose-Capillary: 336 mg/dL — ABNORMAL HIGH (ref 70–99)
Glucose-Capillary: 351 mg/dL — ABNORMAL HIGH (ref 70–99)
Glucose-Capillary: 221 mg/dL — ABNORMAL HIGH (ref 70–99)
Glucose-Capillary: 251 mg/dL — ABNORMAL HIGH (ref 70–99)
Glucose-Capillary: 211 mg/dL — ABNORMAL HIGH (ref 70–99)

## 2021-09-04 MED ORDER — LIP MEDEX EX OINT
TOPICAL_OINTMENT | CUTANEOUS | Status: DC | PRN
  Administered 2021-09-04: 75 via TOPICAL
  Filled 2021-09-04: qty 7

## 2021-09-04 MED ORDER — HUMULIN R U-500 KWIKPEN 500 UNIT/ML ~~LOC~~ SOPN: 60.0000 [IU] | PEN_INJECTOR | Freq: Every day | SUBCUTANEOUS | 0 refills | Status: DC

## 2021-09-04 MED ORDER — HUMULIN R U-500 KWIKPEN 500 UNIT/ML ~~LOC~~ SOPN: 45.0000 [IU] | PEN_INJECTOR | Freq: Every day | SUBCUTANEOUS | 0 refills | Status: DC

## 2021-09-04 NOTE — Progress Notes (Addendum)
[facility] Progress Note   Subjective: Seen in room. No Specific complaints.   Objective Vitals:   09/03/21 1958 09/04/21 0027 09/04/21 0335 09/04/21 0708  BP: (!) 114/57 93/60 (!) 112/43 (!) 149/52  Pulse: 82  81   Resp: 20  18   Temp: 98.6 F (37 C)  97.9 F (36.6 C)   TempSrc: Oral  Oral   SpO2: 93%  96%   Weight:      Height:       Physical Exam General: Well appearing morbidly obese male in NAD Heart: S1,S2 no M/R/G SR on tele Lungs: CTAB No WOB Abdomen: Obese NABS Extremities: L AKA No stump edema. No RLE edema. Still with RUE edema.  Dialysis Access: R maturing AVG +  T/B LIJ TDC drsg intact.     Additional Objective Labs: Basic Metabolic Panel: Recent Labs  Lab 08/31/21 0353 09/01/21 1334 09/02/21 0212  NA 136 136 134*  K 4.5 3.9 3.7  CL 102 96* 94*  CO2 '24 28 27  '$ GLUCOSE 375* 129* 74  BUN 61* 42* 30*  CREATININE 2.82* 3.44* 3.67*  CALCIUM 8.6* 8.5* 9.3   Liver Function Tests: Recent Labs  Lab 08/30/21 1045 09/01/21 1334 09/02/21 0212  AST '16 16 18  '$ ALT '18 17 14  '$ ALKPHOS 77 92 106  BILITOT 0.4 0.1* 0.4  PROT 6.3* 6.7 7.9  ALBUMIN 3.2* 3.3* 4.0   No results for input(s): LIPASE, AMYLASE in the last 168 hours. CBC: Recent Labs  Lab 08/30/21 1045 08/31/21 0353 09/01/21 1334 09/02/21 0212  WBC 6.2 6.3 8.2 8.2  NEUTROABS 4.5  --   --   --   HGB 9.0* 8.5* 9.1* 11.0*  HCT 29.1* 27.2* 29.6* 35.1*  MCV 90.1 88.0 90.0 88.4  PLT 130* 125* 131* 129*   Blood Culture    Component Value Date/Time   SDES BLOOD LEFT ARM 08/04/2021 1830   SPECREQUEST  08/04/2021 1830    BOTTLES DRAWN AEROBIC AND ANAEROBIC Blood Culture results may not be optimal due to an inadequate volume of blood received in culture bottles   CULT  08/04/2021 1830    NO GROWTH 5 DAYS Performed at [facility] [address] [place], [place] [address]    REPTSTATUS 08/09/2021 FINAL 08/04/2021 1830    Cardiac Enzymes: No results for input(s):  CKTOTAL, CKMB, CKMBINDEX, TROPONINI in the last 168 hours. CBG: Recent Labs  Lab 09/03/21 0100 09/03/21 1206 09/03/21 1642 09/03/21 2111 09/04/21 0818  GLUCAP 71 156* 304* 321* 336*   Iron Studies: No results for input(s): IRON, TIBC, TRANSFERRIN, FERRITIN in the last 72 hours. '@lablastinr3'$ @ Studies/Results: DG Chest 2 View  Result Date: 09/03/2021 CLINICAL DATA:  Hemoptysis EXAM: CHEST - 2 VIEW COMPARISON:  08/04/2021 FINDINGS: Left IJ approach hemodialysis catheter is unchanged in positioning. Stable heart size. No focal airspace consolidation, pleural effusion, or pneumothorax. IMPRESSION: No active cardiopulmonary disease. Electronically Signed   By: Davina Poke D.O.   On: 09/03/2021 14:56   DG Abd 2 Views  Result Date: 09/02/2021 CLINICAL DATA:  Vomiting EXAM: ABDOMEN - 2 VIEW COMPARISON:  None. FINDINGS: The bowel gas pattern is normal. There is no evidence of free air. No radio-opaque calculi or other significant radiographic abnormality is seen. IMPRESSION: Negative. Electronically Signed   By: Fidela Salisbury M.D.   On: 09/02/2021 16:40   Medications:   amLODipine  5 mg Oral QHS   aspirin EC  81 mg Oral Daily   atorvastatin  40 mg Oral Daily   calcium acetate  1,334 mg Oral TID with meals   carvedilol  12.5 mg Oral BID WC   Chlorhexidine Gluconate Cloth  6 each Topical Daily   heparin  5,000 Units Subcutaneous Q8H   insulin aspart  0-6 Units Subcutaneous TID WC   insulin regular human CONCENTRATED  45 Units Subcutaneous Q supper   insulin regular human CONCENTRATED  60 Units Subcutaneous Q breakfast   nortriptyline  20 mg Oral QHS   pantoprazole  40 mg Oral BID   pregabalin  25 mg Oral BID   sertraline  100 mg Oral Daily     Dialysis Orders: MWF at [facility] 3:45hr, 450/A1.5, EDW 122kg, 2K/2.5Ca, TDC,   -Heparin 2000 units IV TIW - Mircera 63mg IV q 2 wks (last 9/22)   Assessment/Plan: 1. S/p RUE fistulogram + innomonate vein angioplasty 10/3: edema improving.VVS  signed off.  2. ESRD: Continue HD per usual MWF schedule -Next HD 09/06/2021.  3. HTN/volume: Variable BP - UF as tolerated - unclear if weights correct, if so he is up 9kg. Cramping with last HD. 2.5L UFG today. 4. Anemia: Hgb 11 - s/p Aranesp 1049m on 10/5. 5. Secondary hyperparathyroidism: Ca ok, continue home Phoslo 6. T2DM 7. Vomiting: Unclear etiology, KUB 10/6 negative. Started on PPI BID. CT pending.Per primary.    Disposition:Stable for discharge from nephrology stand point.   Laelia Angelo H. [place]za [place] NP-C 09/04/2021, 9:52 AM  [phone]

## 2021-09-04 NOTE — TOC Progression Note (Signed)
Transition of Care [facility]) - Progression Note    Patient Details  Name: Jeffrey [name] MRN: [id number] Date of Birth: 26-Oct-1961  Transition of Care [facility]) CM/SW [place], [place] Phone Number: [phone] 09/04/2021, 8:39 AM  Clinical Narrative:     CSW checked pt's authorization and it has been approved.  Everlene Balls #- [id number] Health Plan #- [id number] Authorization dates: 10/7-10/12 Fax Number: 1 [phone]    TOC team will continue to assist with discharge planning needs.    Expected Discharge Plan: [place] Barriers to Discharge: No Barriers Identified  Expected Discharge Plan and Services Expected Discharge Plan: [place] In-house Referral: Clinical Social Work     Living arrangements for the past 2 months: [place] Expected Discharge Date: 08/31/21                                     Social Determinants of Health (SDOH) Interventions    Readmission Risk Interventions Readmission Risk Prevention Plan 04/07/2021  Transportation Screening Complete  Medication Review Press photographer) Referral to Pharmacy  PCP or Specialist appointment within 3-5 days of discharge Complete  HRI or Home Care Consult Complete  SW Recovery Care/Counseling Consult Complete  Palliative Care Screening Not [place] Complete

## 2021-09-04 NOTE — Plan of Care (Signed)

## 2021-09-04 NOTE — TOC Progression Note (Signed)
Transition of Care [facility]) - Progression Note    Patient Details  Name: Jeffrey [name] MRN: [id number] Date of Birth: 01/08/1961  Transition of Care [facility]) CM/SW [place], [place] Phone Number: [phone] 09/04/2021, 2:16 PM  Clinical Narrative:     Facility prefers early DC and have requested that pt be DC in the morning due to having the correct medications. Also DC summary would need to reflect DC date. Facility is requesting morning DC.  CSW alerted MD with updates about DC in the morning.  TOC team will continue to assist with discharge planning needs.   Expected Discharge Plan: [place] Barriers to Discharge: No Barriers Identified  Expected Discharge Plan and Services Expected Discharge Plan: [place] In-house Referral: Clinical Social Work     Living arrangements for the past 2 months: [place] Expected Discharge Date: 09/04/21                                     Social Determinants of Health (SDOH) Interventions    Readmission Risk Interventions Readmission Risk Prevention Plan 04/07/2021  Transportation Screening Complete  Medication Review Press photographer) Referral to Pharmacy  PCP or Specialist appointment within 3-5 days of discharge Complete  HRI or Home Care Consult Complete  SW Recovery Care/Counseling Consult Complete  Palliative Care Screening Not [place] Complete

## 2021-09-05 LAB — GLUCOSE, CAPILLARY: Glucose-Capillary: 190 mg/dL — ABNORMAL HIGH (ref 70–99)

## 2021-09-05 NOTE — Progress Notes (Signed)
[facility] Progress Note   Subjective: No issues noted overnight. HD tomorrow on schedule.    Objective Vitals:   09/04/21 1555 09/04/21 2100 09/04/21 2228 09/05/21 0606  BP: (!) 165/58 (!) 130/52 (!) 145/59 (!) 133/49  Pulse: 88 83  78  Resp:  16  16  Temp: 98.4 F (36.9 C) 98.6 F (37 C)  98.3 F (36.8 C)  TempSrc: Oral Oral  Oral  SpO2: 97% 93%  90%  Weight:      Height:       Physical Exam General: Well appearing morbidly obese male in NAD Heart: S1,S2 no M/R/G SR on tele Lungs: CTAB No WOB Abdomen: Obese NABS Extremities: L AKA No stump edema. No RLE edema. Still with RUE edema.  Dialysis Access: R maturing AVG +  T/B LIJ TDC drsg intact.      Additional Objective Labs: Basic Metabolic Panel: Recent Labs  Lab 08/31/21 0353 09/01/21 1334 09/02/21 0212  NA 136 136 134*  K 4.5 3.9 3.7  CL 102 96* 94*  CO2 '24 28 27  '$ GLUCOSE 375* 129* 74  BUN 61* 42* 30*  CREATININE 2.82* 3.44* 3.67*  CALCIUM 8.6* 8.5* 9.3   Liver Function Tests: Recent Labs  Lab 08/30/21 1045 09/01/21 1334 09/02/21 0212  AST '16 16 18  '$ ALT '18 17 14  '$ ALKPHOS 77 92 106  BILITOT 0.4 0.1* 0.4  PROT 6.3* 6.7 7.9  ALBUMIN 3.2* 3.3* 4.0   No results for input(s): LIPASE, AMYLASE in the last 168 hours. CBC: Recent Labs  Lab 08/30/21 1045 08/31/21 0353 09/01/21 1334 09/02/21 0212  WBC 6.2 6.3 8.2 8.2  NEUTROABS 4.5  --   --   --   HGB 9.0* 8.5* 9.1* 11.0*  HCT 29.1* 27.2* 29.6* 35.1*  MCV 90.1 88.0 90.0 88.4  PLT 130* 125* 131* 129*   Blood Culture    Component Value Date/Time   SDES BLOOD LEFT ARM 08/04/2021 1830   SPECREQUEST  08/04/2021 1830    BOTTLES DRAWN AEROBIC AND ANAEROBIC Blood Culture results may not be optimal due to an inadequate volume of blood received in culture bottles   CULT  08/04/2021 1830    NO GROWTH 5 DAYS Performed at [facility] [address] [place], [place] [address]    REPTSTATUS 08/09/2021 FINAL 08/04/2021 1830     Cardiac Enzymes: No results for input(s): CKTOTAL, CKMB, CKMBINDEX, TROPONINI in the last 168 hours. CBG: Recent Labs  Lab 09/04/21 1133 09/04/21 1557 09/04/21 1808 09/04/21 2135 09/05/21 0743  GLUCAP 351* 221* 251* 211* 190*   Iron Studies: No results for input(s): IRON, TIBC, TRANSFERRIN, FERRITIN in the last 72 hours. '@lablastinr3'$ @ Studies/Results: DG Chest 2 View  Result Date: 09/03/2021 CLINICAL DATA:  Hemoptysis EXAM: CHEST - 2 VIEW COMPARISON:  08/04/2021 FINDINGS: Left IJ approach hemodialysis catheter is unchanged in positioning. Stable heart size. No focal airspace consolidation, pleural effusion, or pneumothorax. IMPRESSION: No active cardiopulmonary disease. Electronically Signed   By: Davina Poke D.O.   On: 09/03/2021 14:56   Medications:   amLODipine  5 mg Oral QHS   aspirin EC  81 mg Oral Daily   atorvastatin  40 mg Oral Daily   calcium acetate  1,334 mg Oral TID with meals   carvedilol  12.5 mg Oral BID WC   Chlorhexidine Gluconate Cloth  6 each Topical Daily   heparin  5,000 Units Subcutaneous Q8H   insulin aspart  0-6 Units Subcutaneous TID WC  insulin regular human CONCENTRATED  45 Units Subcutaneous Q supper   insulin regular human CONCENTRATED  60 Units Subcutaneous Q breakfast   nortriptyline  20 mg Oral QHS   pantoprazole  40 mg Oral BID   pregabalin  25 mg Oral BID   sertraline  100 mg Oral Daily     Dialysis Orders: MWF at [facility] 3:45hr, 450/A1.5, EDW 122kg, 2K/2.5Ca, TDC, ADD UFP2  -Heparin 2000 units IV TIW - Mircera 66mg IV q 2 wks (last 9/22)   Assessment/Plan: 1. S/p RUE fistulogram + innomonate vein angioplasty 10/3: edema improving.VVS signed off.  2. ESRD: Continue HD per usual MWF schedule -Next HD 09/06/2021.  3. HTN/volume: Variable BP - UF as tolerated - unclear if weights correct, but currently 5.5 kg above OP EDW. Cramping with last HD.  Add UFP 2.  UFG as tolerated. . 4. Anemia: Hgb 11 - s/p Aranesp 1035m on 10/5. 5.  Secondary hyperparathyroidism: Ca ok, continue home Phoslo 6. T2DM 7. Vomiting: Unclear etiology, KUB 10/6 negative. Refused CT. Started on PPI BID.   Disposition:Stable for discharge from nephrology stand point.   Camora Tremain H. [place] NP-C 09/05/2021, 8:29 AM  [phone]

## 2021-09-05 NOTE — TOC Transition Note (Signed)
Transition of Care [facility]) - CM/SW Discharge Note   Patient Details  Name: Jeffrey [name] MRN: [id number] Date of Birth: [birth year]  [facility]) CM/SW Contact:  Bary Castilla, LCSW Phone Number: [phone] 09/05/2021, 8:30 AM   Clinical Narrative:     Patient will DC to:?[facility] Anticipated DC date:?09/05/2021 Family notified:?Daisha Transport by: Corey Harold   Per MD patient ready for DC to [facility]?. RN, patient, patient's family, and facility notified of DC. Discharge Summary sent to facility. RN given number for report  [id number] room [id number]. DC packet on chart. Ambulance transport requested for patient.   CSW signing off.   [place], [place] [phone]   Final next level of care: Skilled Nursing Facility Barriers to Discharge: No Barriers Identified   Patient Goals and CMS Choice Patient states their goals for this hospitalization and ongoing recovery are:: to go to SNF CMS Medicare.gov Compare Post Acute Care list provided to:: Patient Choice offered to / list presented to : Patient  Discharge Placement              Patient chooses bed at:  ([facility]) Patient to be transferred to facility by: [place] Name of family member notified: Daisha Patient and family notified of of transfer: 09/04/21  Discharge Plan and Services In-house Referral: Clinical Social Work                                   Social Determinants of Health ([place]) Interventions     Readmission Risk Interventions Readmission Risk Prevention Plan 04/07/2021  Transportation Screening Complete  Medication Review Press photographer) Referral to Pharmacy  PCP or Specialist appointment within 3-5 days of discharge Complete  HRI or Home Care Consult Complete  SW Recovery Care/Counseling Consult Complete  Palliative Care Screening Not [place] Complete

## 2021-09-15 NOTE — Progress Notes (Signed)
Per Boyd Kerbs RN   09/04/21 1545  What Happened  Was fall witnessed? No  Was patient injured? No  Patient found on floor  Found by Staff-comment (Pt called to unit secretary with call bell. Myself, Denton Ar, NT and Yoko, RN responded to find pt sitting on floor between recliner and bed.)  Stated prior activity to/from bed, chair, or stretcher  Follow Up  MD notified Dr Marthenia Rolling  Time MD notified [phone]  Family notified No - patient refusal  Simple treatment Other (comment) (Assessed skin and Rt ankle. No apparent injury. Denies any change in c/o pain. Pain med given prior to fall for c/o Rt arm pain. No c/o expressed.)  Adult Fall Risk Assessment  Risk Factor Category (scoring not indicated) Not Applicable  Age 60  Fall History: Fall within 6 months prior to admission 0  Elimination; Bowel and/or Urine Incontinence 0  Elimination; Bowel and/or Urine Urgency/Frequency 2  Medications: includes PCA/Opiates, Anti-convulsants, Anti-hypertensives, Diuretics, Hypnotics, Laxatives, Sedatives, and Psychotropics 5  Patient Care Equipment 1  Mobility-Assistance 2  Mobility-Gait 2  Mobility-Sensory Deficit 0  Altered awareness of immediate physical environment 0  Impulsiveness 0  Lack of understanding of one's physical/cognitive limitations 0  Total Score 13  Patient Fall Risk Level Moderate fall risk  Adult Fall Risk Interventions  Required Bundle Interventions *See Row Information* Moderate fall risk - low and moderate requirements implemented  Additional Interventions Use of appropriate toileting equipment (bedpan, BSC, etc.)  Screening for Fall Injury Risk (To be completed on HIGH fall risk patients) - Assessing Need for Floor Mats  Risk For Fall Injury- Criteria for Floor Mats Noncompliant with safety precautions (Bed alarm set)  Pain Assessment  Pain Scale 0-10  Pain Score 10  Pain Type Acute pain;Surgical pain  Pain Location Arm  Pain Orientation Right  Pain Radiating Towards  denies  Pain Descriptors / Indicators Aching;Discomfort  Pain Frequency Constant  Pain Onset On-going  Patients Stated Pain Goal 2  Pain Intervention(s) Repositioned (Assisted back to bed s/p "fall". No further needs expressed r/t pain.)  Multiple Pain Sites No  Neurological  Neuro (WDL) X  Level of Consciousness Alert  Orientation Level Oriented X4  Cognition Appropriate at baseline;Appropriate attention/concentration;Appropriate judgement;Follows commands;Poor Dance movement psychotherapist Function/Sensation Assessment  (Left AKA)  Glasgow Coma Scale  Eye Opening 4  Best Verbal Response (NON-intubated) 5  Best Motor Response 6  Glasgow Coma Scale Score 15  Musculoskeletal  Musculoskeletal (WDL) X  Assistive Device BSC  Generalized Weakness Yes  Musculoskeletal Details  RUE Limited movement  LLE AKA  Integumentary  Integumentary (WDL) X  Skin Color Appropriate for ethnicity  Skin Condition Dry  Skin Integrity Amputation;Surgical Incision (see LDA)  Amputation Location Leg  Amputation Location Orientation Left  Amputation Intervention Other (Comment) (Assessed)  Pain Assessment  Date Pain First Started  (Prior to admission)  Result of Injury No  Pain Assessment  Work-Related Injury No  Pain Screening  Effect of Pain on Daily Activities Limited movement of RUE s/p surgical intervention  Clinical Progression Not changed

## 2021-10-27 DIAGNOSIS — Z96611 Presence of right artificial shoulder joint: Secondary | ICD-10-CM | POA: Insufficient documentation

## 2021-11-02 ENCOUNTER — Telehealth: Payer: Self-pay

## 2021-11-02 NOTE — Telephone Encounter (Signed)
Jeffrey [name] with [facility] in [place], would like OP note and any other clinical notes faxed to [phone].attn:Michelle.  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  CB# [phone].  Please advise.  Thank you.

## 2021-11-02 NOTE — Telephone Encounter (Signed)
This is not a Dr. Sharol Given pt called and sw Sharyn Lull and advised that his surgery 03/2021 was with Dr. Melony Overly  to call back if there is anything that I can do to help.

## 2021-11-06 ENCOUNTER — Observation Stay (HOSPITAL_COMMUNITY)
Admission: EM | Admit: 2021-11-06 | Discharge: 2021-11-09 | Disposition: A | Discharge status: Home Health | Payer: Medicare Other | Attending: Family Medicine | Admitting: Family Medicine

## 2021-11-06 ENCOUNTER — Other Ambulatory Visit: Payer: Self-pay

## 2021-11-06 DIAGNOSIS — J449 Chronic obstructive pulmonary disease, unspecified: Secondary | ICD-10-CM | POA: Insufficient documentation

## 2021-11-06 DIAGNOSIS — E8770 Fluid overload, unspecified: Secondary | ICD-10-CM

## 2021-11-06 DIAGNOSIS — Z7982 Long term (current) use of aspirin: Secondary | ICD-10-CM | POA: Insufficient documentation

## 2021-11-06 DIAGNOSIS — I509 Heart failure, unspecified: Secondary | ICD-10-CM | POA: Diagnosis not present

## 2021-11-06 DIAGNOSIS — E1169 Type 2 diabetes mellitus with other specified complication: Secondary | ICD-10-CM | POA: Diagnosis present

## 2021-11-06 DIAGNOSIS — E785 Hyperlipidemia, unspecified: Secondary | ICD-10-CM | POA: Diagnosis present

## 2021-11-06 DIAGNOSIS — D631 Anemia in chronic kidney disease: Secondary | ICD-10-CM | POA: Insufficient documentation

## 2021-11-06 DIAGNOSIS — R0602 Shortness of breath: Secondary | ICD-10-CM | POA: Diagnosis present

## 2021-11-06 DIAGNOSIS — R079 Chest pain, unspecified: Secondary | ICD-10-CM | POA: Diagnosis not present

## 2021-11-06 DIAGNOSIS — M7989 Other specified soft tissue disorders: Secondary | ICD-10-CM | POA: Diagnosis present

## 2021-11-06 DIAGNOSIS — Z96653 Presence of artificial knee joint, bilateral: Secondary | ICD-10-CM | POA: Diagnosis not present

## 2021-11-06 DIAGNOSIS — Z20822 Contact with and (suspected) exposure to covid-19: Secondary | ICD-10-CM | POA: Diagnosis not present

## 2021-11-06 DIAGNOSIS — E162 Hypoglycemia, unspecified: Secondary | ICD-10-CM | POA: Diagnosis present

## 2021-11-06 DIAGNOSIS — M25569 Pain in unspecified knee: Secondary | ICD-10-CM

## 2021-11-06 DIAGNOSIS — Z87891 Personal history of nicotine dependence: Secondary | ICD-10-CM | POA: Diagnosis not present

## 2021-11-06 DIAGNOSIS — E1122 Type 2 diabetes mellitus with diabetic chronic kidney disease: Secondary | ICD-10-CM | POA: Insufficient documentation

## 2021-11-06 DIAGNOSIS — Z79899 Other long term (current) drug therapy: Secondary | ICD-10-CM | POA: Diagnosis not present

## 2021-11-06 DIAGNOSIS — F4310 Post-traumatic stress disorder, unspecified: Secondary | ICD-10-CM | POA: Diagnosis present

## 2021-11-06 DIAGNOSIS — Z794 Long term (current) use of insulin: Secondary | ICD-10-CM | POA: Insufficient documentation

## 2021-11-06 DIAGNOSIS — Z7901 Long term (current) use of anticoagulants: Secondary | ICD-10-CM | POA: Insufficient documentation

## 2021-11-06 DIAGNOSIS — Z992 Dependence on renal dialysis: Secondary | ICD-10-CM | POA: Diagnosis not present

## 2021-11-06 DIAGNOSIS — I160 Hypertensive urgency: Secondary | ICD-10-CM | POA: Diagnosis present

## 2021-11-06 DIAGNOSIS — D649 Anemia, unspecified: Secondary | ICD-10-CM | POA: Diagnosis present

## 2021-11-06 DIAGNOSIS — D638 Anemia in other chronic diseases classified elsewhere: Secondary | ICD-10-CM | POA: Diagnosis present

## 2021-11-06 DIAGNOSIS — N186 End stage renal disease: Secondary | ICD-10-CM | POA: Diagnosis present

## 2021-11-06 DIAGNOSIS — I132 Hypertensive heart and chronic kidney disease with heart failure and with stage 5 chronic kidney disease, or end stage renal disease: Secondary | ICD-10-CM | POA: Diagnosis not present

## 2021-11-06 NOTE — ED Triage Notes (Signed)
Per EMS pt from home.  Called out reference fall -pt has broken right ankle but wasn't wearing his boot and AKA on the left and tried to stand.  Missed dialysis on Friday.  Pt has distention in his stomach. Right shoulder sx 6 weeks ago.  Fistula on the right arm not mature yet but has swelling noted at the site and numbness at the site.  Stabbing CP began at 0600 today.  Non radiating.  Intermittent.  174/124, 96% on RA, HR 87 324 ASA and 2 nitro.  CBG 183

## 2021-11-07 ENCOUNTER — Observation Stay (HOSPITAL_BASED_OUTPATIENT_CLINIC_OR_DEPARTMENT_OTHER): Payer: Medicare Other

## 2021-11-07 ENCOUNTER — Emergency Department (HOSPITAL_COMMUNITY): Payer: Medicare Other

## 2021-11-07 DIAGNOSIS — F4310 Post-traumatic stress disorder, unspecified: Secondary | ICD-10-CM

## 2021-11-07 DIAGNOSIS — M7989 Other specified soft tissue disorders: Secondary | ICD-10-CM

## 2021-11-07 DIAGNOSIS — E162 Hypoglycemia, unspecified: Secondary | ICD-10-CM

## 2021-11-07 DIAGNOSIS — D638 Anemia in other chronic diseases classified elsewhere: Secondary | ICD-10-CM | POA: Diagnosis not present

## 2021-11-07 DIAGNOSIS — E785 Hyperlipidemia, unspecified: Secondary | ICD-10-CM

## 2021-11-07 DIAGNOSIS — N186 End stage renal disease: Secondary | ICD-10-CM

## 2021-11-07 DIAGNOSIS — R079 Chest pain, unspecified: Secondary | ICD-10-CM | POA: Diagnosis not present

## 2021-11-07 DIAGNOSIS — I160 Hypertensive urgency: Secondary | ICD-10-CM | POA: Diagnosis present

## 2021-11-07 DIAGNOSIS — R609 Edema, unspecified: Secondary | ICD-10-CM | POA: Diagnosis not present

## 2021-11-07 DIAGNOSIS — I5032 Chronic diastolic (congestive) heart failure: Secondary | ICD-10-CM | POA: Diagnosis not present

## 2021-11-07 DIAGNOSIS — E1169 Type 2 diabetes mellitus with other specified complication: Secondary | ICD-10-CM

## 2021-11-07 LAB — CBG MONITORING, ED
Glucose-Capillary: 73 mg/dL (ref 70–99)
Glucose-Capillary: 115 mg/dL — ABNORMAL HIGH (ref 70–99)
Glucose-Capillary: 69 mg/dL — ABNORMAL LOW (ref 70–99)
Glucose-Capillary: 71 mg/dL (ref 70–99)
Glucose-Capillary: 116 mg/dL — ABNORMAL HIGH (ref 70–99)

## 2021-11-07 LAB — CBC WITH DIFFERENTIAL/PLATELET
Abs Immature Granulocytes: 0.1 10*3/uL — ABNORMAL HIGH (ref 0.00–0.07)
Basophils Absolute: 0 10*3/uL (ref 0.0–0.1)
Basophils Relative: 0 %
Eosinophils Absolute: 0.3 10*3/uL (ref 0.0–0.5)
Eosinophils Relative: 3 %
HCT: 31.5 % — ABNORMAL LOW (ref 39.0–52.0)
Hemoglobin: 9.5 g/dL — ABNORMAL LOW (ref 13.0–17.0)
Immature Granulocytes: 1 %
Lymphocytes Relative: 13 %
Lymphs Abs: 1.1 10*3/uL (ref 0.7–4.0)
MCH: 27.4 pg (ref 26.0–34.0)
MCHC: 30.2 g/dL (ref 30.0–36.0)
MCV: 90.8 fL (ref 80.0–100.0)
Monocytes Absolute: 0.6 10*3/uL (ref 0.1–1.0)
Monocytes Relative: 7 %
Neutro Abs: 6.4 10*3/uL (ref 1.7–7.7)
Neutrophils Relative %: 76 %
Platelets: 151 10*3/uL (ref 150–400)
RBC: 3.47 MIL/uL — ABNORMAL LOW (ref 4.22–5.81)
RDW: 17.2 % — ABNORMAL HIGH (ref 11.5–15.5)
WBC: 8.5 10*3/uL (ref 4.0–10.5)
nRBC: 0 % (ref 0.0–0.2)

## 2021-11-07 LAB — URINALYSIS, ROUTINE W REFLEX MICROSCOPIC
Bilirubin Urine: NEGATIVE
Glucose, UA: NEGATIVE mg/dL
Hgb urine dipstick: NEGATIVE
Ketones, ur: NEGATIVE mg/dL
Leukocytes,Ua: NEGATIVE
Nitrite: NEGATIVE
Protein, ur: NEGATIVE mg/dL
Specific Gravity, Urine: 1.006 (ref 1.005–1.030)
pH: 5 (ref 5.0–8.0)

## 2021-11-07 LAB — TROPONIN I (HIGH SENSITIVITY)
Troponin I (High Sensitivity): 18 ng/L — ABNORMAL HIGH (ref <18)
Troponin I (High Sensitivity): 18 ng/L — ABNORMAL HIGH (ref <18)

## 2021-11-07 LAB — PHOSPHORUS: Phosphorus: 4.6 mg/dL (ref 2.5–4.6)

## 2021-11-07 LAB — COMPREHENSIVE METABOLIC PANEL
ALT: 12 U/L (ref 0–44)
AST: 15 U/L (ref 15–41)
Albumin: 3.5 g/dL (ref 3.5–5.0)
Alkaline Phosphatase: 64 U/L (ref 38–126)
Anion gap: 14 (ref 5–15)
BUN: 70 mg/dL — ABNORMAL HIGH (ref 8–23)
CO2: 33 mmol/L — ABNORMAL HIGH (ref 22–32)
Calcium: 8.6 mg/dL — ABNORMAL LOW (ref 8.9–10.3)
Chloride: 93 mmol/L — ABNORMAL LOW (ref 98–111)
Creatinine, Ser: 3.77 mg/dL — ABNORMAL HIGH (ref 0.61–1.24)
GFR, Estimated: 17 mL/min — ABNORMAL LOW (ref >60)
Glucose, Bld: 96 mg/dL (ref 70–99)
Potassium: 3.1 mmol/L — ABNORMAL LOW (ref 3.5–5.1)
Sodium: 140 mmol/L (ref 135–145)
Total Bilirubin: 0.3 mg/dL (ref 0.3–1.2)
Total Protein: 7.3 g/dL (ref 6.5–8.1)

## 2021-11-07 LAB — RESP PANEL BY RT-PCR (FLU A&B, COVID) ARPGX2
Influenza A by PCR: NEGATIVE
Influenza B by PCR: NEGATIVE
SARS Coronavirus 2 by RT PCR: NEGATIVE

## 2021-11-07 LAB — HEPATITIS B SURFACE ANTIGEN: Hepatitis B Surface Ag: NONREACTIVE

## 2021-11-07 LAB — MAGNESIUM: Magnesium: 1.4 mg/dL — ABNORMAL LOW (ref 1.7–2.4)

## 2021-11-07 LAB — GLUCOSE, CAPILLARY: Glucose-Capillary: 462 mg/dL — ABNORMAL HIGH (ref 70–99)

## 2021-11-07 LAB — BRAIN NATRIURETIC PEPTIDE: B Natriuretic Peptide: 77.9 pg/mL (ref 0.0–100.0)

## 2021-11-07 MED ORDER — MAGNESIUM OXIDE -MG SUPPLEMENT 400 (240 MG) MG PO TABS
800.0000 mg | ORAL_TABLET | Freq: Two times a day (BID) | ORAL | Status: AC
  Administered 2021-11-07 (×2): 800 mg via ORAL
  Filled 2021-11-07 (×2): qty 2

## 2021-11-07 MED ORDER — TAMSULOSIN HCL 0.4 MG PO CAPS
0.4000 mg | ORAL_CAPSULE | Freq: Every day | ORAL | Status: DC
  Administered 2021-11-07 – 2021-11-09 (×3): 0.4 mg via ORAL
  Filled 2021-11-07 (×4): qty 1

## 2021-11-07 MED ORDER — ALBUTEROL SULFATE (2.5 MG/3ML) 0.083% IN NEBU: 2.5000 mg | INHALATION_SOLUTION | Freq: Four times a day (QID) | RESPIRATORY_TRACT | Status: DC | PRN

## 2021-11-07 MED ORDER — ASPIRIN EC 81 MG PO TBEC
81.0000 mg | DELAYED_RELEASE_TABLET | Freq: Every day | ORAL | Status: DC
  Administered 2021-11-08 – 2021-11-09 (×2): 81 mg via ORAL
  Filled 2021-11-07 (×2): qty 1

## 2021-11-07 MED ORDER — PANTOPRAZOLE SODIUM 20 MG PO TBEC
20.0000 mg | DELAYED_RELEASE_TABLET | Freq: Every day | ORAL | Status: DC
  Administered 2021-11-07 – 2021-11-09 (×3): 20 mg via ORAL
  Filled 2021-11-07 (×4): qty 1

## 2021-11-07 MED ORDER — INSULIN REGULAR HUMAN (CONC) 500 UNIT/ML ~~LOC~~ SOPN: 75.0000 [IU] | PEN_INJECTOR | Freq: Every day | SUBCUTANEOUS | Status: DC

## 2021-11-07 MED ORDER — SODIUM CHLORIDE 0.9% FLUSH
3.0000 mL | Freq: Two times a day (BID) | INTRAVENOUS | Status: DC
  Administered 2021-11-07 – 2021-11-09 (×4): 3 mL via INTRAVENOUS

## 2021-11-07 MED ORDER — HEPARIN SODIUM (PORCINE) 1000 UNIT/ML IJ SOLN
INTRAMUSCULAR | Status: AC
  Filled 2021-11-07: qty 4

## 2021-11-07 MED ORDER — NORTRIPTYLINE HCL 10 MG PO CAPS
20.0000 mg | ORAL_CAPSULE | Freq: Every day | ORAL | Status: DC
  Administered 2021-11-07 – 2021-11-08 (×2): 20 mg via ORAL
  Filled 2021-11-07 (×3): qty 2

## 2021-11-07 MED ORDER — ONDANSETRON HCL 4 MG PO TABS: 4.0000 mg | ORAL_TABLET | Freq: Four times a day (QID) | ORAL | Status: DC | PRN

## 2021-11-07 MED ORDER — METOLAZONE 5 MG PO TABS
10.0000 mg | ORAL_TABLET | Freq: Every day | ORAL | Status: DC
  Administered 2021-11-07 – 2021-11-09 (×3): 10 mg via ORAL
  Filled 2021-11-07 (×3): qty 2

## 2021-11-07 MED ORDER — POLYETHYLENE GLYCOL 3350 17 G PO PACK: 17.0000 g | PACK | Freq: Every day | ORAL | Status: DC | PRN

## 2021-11-07 MED ORDER — CHLORHEXIDINE GLUCONATE CLOTH 2 % EX PADS
6.0000 | MEDICATED_PAD | Freq: Every day | CUTANEOUS | Status: DC
  Administered 2021-11-07 – 2021-11-09 (×2): 6 via TOPICAL

## 2021-11-07 MED ORDER — AMLODIPINE BESYLATE 10 MG PO TABS
10.0000 mg | ORAL_TABLET | Freq: Every day | ORAL | Status: DC
  Administered 2021-11-07 – 2021-11-08 (×2): 10 mg via ORAL
  Filled 2021-11-07 (×2): qty 1

## 2021-11-07 MED ORDER — CETAPHIL MOISTURIZING EX LOTN
TOPICAL_LOTION | Freq: Every day | CUTANEOUS | Status: DC | PRN
  Filled 2021-11-07: qty 473

## 2021-11-07 MED ORDER — ATORVASTATIN CALCIUM 40 MG PO TABS
40.0000 mg | ORAL_TABLET | Freq: Every day | ORAL | Status: DC
  Administered 2021-11-07 – 2021-11-09 (×3): 40 mg via ORAL
  Filled 2021-11-07 (×3): qty 1

## 2021-11-07 MED ORDER — DEXTROSE 50 % IV SOLN: 1.0000 | INTRAVENOUS | Status: DC | PRN

## 2021-11-07 MED ORDER — LIDOCAINE HCL (PF) 1 % IJ SOLN: 5.0000 mL | INTRAMUSCULAR | Status: DC | PRN

## 2021-11-07 MED ORDER — INSULIN REGULAR HUMAN (CONC) 500 UNIT/ML ~~LOC~~ SOPN
125.0000 [IU] | PEN_INJECTOR | Freq: Every day | SUBCUTANEOUS | Status: DC
Start: 2021-11-07 — End: 2021-11-08
  Administered 2021-11-07: 125 [IU] via SUBCUTANEOUS
  Filled 2021-11-07: qty 3

## 2021-11-07 MED ORDER — SODIUM CHLORIDE 0.9 % IV SOLN: 100.0000 mL | INTRAVENOUS | Status: DC | PRN

## 2021-11-07 MED ORDER — GLUCAGON HCL RDNA (DIAGNOSTIC) 1 MG IJ SOLR: 1.0000 mg | INTRAMUSCULAR | Status: DC | PRN

## 2021-11-07 MED ORDER — INSULIN ASPART 100 UNIT/ML IJ SOLN
0.0000 [IU] | Freq: Three times a day (TID) | INTRAMUSCULAR | Status: DC
  Administered 2021-11-08: 2 [IU] via SUBCUTANEOUS
  Administered 2021-11-09: 5 [IU] via SUBCUTANEOUS
  Administered 2021-11-09 (×2): 6 [IU] via SUBCUTANEOUS

## 2021-11-07 MED ORDER — ASPIRIN 81 MG PO CHEW
324.0000 mg | CHEWABLE_TABLET | Freq: Once | ORAL | Status: AC
  Administered 2021-11-07: 324 mg via ORAL
  Filled 2021-11-07: qty 4

## 2021-11-07 MED ORDER — SENNOSIDES-DOCUSATE SODIUM 8.6-50 MG PO TABS: 2.0000 | ORAL_TABLET | Freq: Two times a day (BID) | ORAL | Status: DC | PRN

## 2021-11-07 MED ORDER — OXYCODONE-ACETAMINOPHEN 5-325 MG PO TABS
1.0000 | ORAL_TABLET | Freq: Four times a day (QID) | ORAL | Status: DC | PRN
  Administered 2021-11-07 – 2021-11-08 (×3): 1 via ORAL
  Filled 2021-11-07 (×3): qty 1

## 2021-11-07 MED ORDER — ALTEPLASE 2 MG IJ SOLR
2.0000 mg | Freq: Once | INTRAMUSCULAR | Status: DC | PRN
  Filled 2021-11-07: qty 2

## 2021-11-07 MED ORDER — HYDROMORPHONE HCL 1 MG/ML IJ SOLN
0.5000 mg | INTRAMUSCULAR | Status: AC | PRN
  Administered 2021-11-07 (×2): 0.5 mg via INTRAVENOUS
  Filled 2021-11-07 (×2): qty 0.5

## 2021-11-07 MED ORDER — OINTMENT BASE EX OINT: 1.0000 "application " | TOPICAL_OINTMENT | Freq: Every day | CUTANEOUS | Status: DC | PRN

## 2021-11-07 MED ORDER — HEPARIN SODIUM (PORCINE) 1000 UNIT/ML DIALYSIS
1000.0000 [IU] | INTRAMUSCULAR | Status: DC | PRN
  Filled 2021-11-07: qty 1

## 2021-11-07 MED ORDER — CHLORHEXIDINE GLUCONATE CLOTH 2 % EX PADS
6.0000 | MEDICATED_PAD | Freq: Every day | CUTANEOUS | Status: DC
  Administered 2021-11-08: 6 via TOPICAL

## 2021-11-07 MED ORDER — INSULIN REGULAR HUMAN (CONC) 500 UNIT/ML ~~LOC~~ SOPN
75.0000 [IU] | PEN_INJECTOR | Freq: Every day | SUBCUTANEOUS | Status: DC
Start: 2021-11-09 — End: 2021-11-08
  Filled 2021-11-07: qty 3

## 2021-11-07 MED ORDER — TORSEMIDE 100 MG PO TABS
100.0000 mg | ORAL_TABLET | ORAL | Status: DC
  Administered 2021-11-07 – 2021-11-09 (×2): 100 mg via ORAL
  Filled 2021-11-07: qty 5
  Filled 2021-11-07: qty 1

## 2021-11-07 MED ORDER — ACETAMINOPHEN 650 MG RE SUPP: 650.0000 mg | Freq: Four times a day (QID) | RECTAL | Status: DC | PRN

## 2021-11-07 MED ORDER — POTASSIUM CHLORIDE CRYS ER 20 MEQ PO TBCR: 40.0000 meq | EXTENDED_RELEASE_TABLET | Freq: Once | ORAL | Status: DC

## 2021-11-07 MED ORDER — PENTAFLUOROPROP-TETRAFLUOROETH EX AERO
1.0000 "application " | INHALATION_SPRAY | CUTANEOUS | Status: DC | PRN
  Filled 2021-11-07: qty 116

## 2021-11-07 MED ORDER — ACETAMINOPHEN 325 MG PO TABS: 650.0000 mg | ORAL_TABLET | Freq: Four times a day (QID) | ORAL | Status: DC | PRN

## 2021-11-07 MED ORDER — CARVEDILOL 12.5 MG PO TABS
12.5000 mg | ORAL_TABLET | Freq: Two times a day (BID) | ORAL | Status: DC
  Administered 2021-11-07 – 2021-11-09 (×3): 12.5 mg via ORAL
  Filled 2021-11-07 (×3): qty 1

## 2021-11-07 MED ORDER — SERTRALINE HCL 100 MG PO TABS
100.0000 mg | ORAL_TABLET | Freq: Every day | ORAL | Status: DC
  Administered 2021-11-07 – 2021-11-09 (×3): 100 mg via ORAL
  Filled 2021-11-07 (×3): qty 1

## 2021-11-07 MED ORDER — CALCIUM ACETATE (PHOS BINDER) 667 MG PO CAPS
1334.0000 mg | ORAL_CAPSULE | Freq: Three times a day (TID) | ORAL | Status: DC
  Administered 2021-11-07 – 2021-11-09 (×6): 1334 mg via ORAL
  Filled 2021-11-07 (×6): qty 2

## 2021-11-07 MED ORDER — LIDOCAINE-PRILOCAINE 2.5-2.5 % EX CREA
1.0000 "application " | TOPICAL_CREAM | CUTANEOUS | Status: DC | PRN
  Filled 2021-11-07: qty 5

## 2021-11-07 MED ORDER — APIXABAN 2.5 MG PO TABS
2.5000 mg | ORAL_TABLET | Freq: Two times a day (BID) | ORAL | Status: DC
  Administered 2021-11-07 – 2021-11-09 (×5): 2.5 mg via ORAL
  Filled 2021-11-07 (×5): qty 1

## 2021-11-07 MED ORDER — ONDANSETRON HCL 4 MG/2ML IJ SOLN: 4.0000 mg | Freq: Four times a day (QID) | INTRAMUSCULAR | Status: DC | PRN

## 2021-11-07 NOTE — Progress Notes (Addendum)
Jeffrey [name] KIDNEY BRIEF PROGRESS NOTE  Jeffrey [name] Feb 26, 1961 [id number]  60 year old male ESRD patient on HD MWF at [facility], presented to the ED due to volume overload with chest pain, SOB, orthopnea and pain in his extremities.  Patient is chronically non complaint with dialysis, missing treatments and signing off early on a regular basis due to cramping per patient.  Missed last HD and left 8L over dry weight on 12/7.    On exam edema noted in upper and lower extremities as well as abdomen.  Breath sounds decreased but no crackles present.  CXR shows vascular congestion.  Labs significant for K 3.1, BUN 70.   OP HD orders: MWF [facility] 3hrs 2K 2.5Ca UF profile 4 Heparin 2000 unit bolus qHD Mircera 100 q2wks, last 12/1  Plan: Plan for HD today off schedule due to volume overload.  Use increased K bath due to K 3.1.  Will resume regular schedule tomorrow.  Can have heart healthy/carb modified diet for now, just monitor labs closely.    Jen Mow, PA-C [facility] Pager: [phone]

## 2021-11-07 NOTE — ED Provider Notes (Signed)
[facility] EMERGENCY DEPARTMENT Provider Note   CSN: [id number] Arrival date & time: 11/06/21  2354     History Chief Complaint  Patient presents with   Chest Pain    Jeffrey [name] is a 60 y.o. male.  Patient with a history of ESRD on dialysis, diabetes, COPD, hypertension presenting with multiple complaints.  States he has been having intermittent central chest pain going to his left arm for the past day lasting for few minutes at a time.  Pain is worse when he exerts himself.  Pain radiates to his left arm and is associated with shortness of breath and nausea.  No vomiting.  No cough or fever.  He has had similar pain in the past from his heart but never had any stents in his heart was told he had a heart attack previously. He had this pain on and off all day but his wife made him come to the hospital last night because he fell out of bed trying to transfer and landed on his right ankle and right shoulder.  Did not hit his head.  Does not take any blood thinners. He states he missed dialysis on Friday but normally goes every Monday Wednesday and Friday.  Still makes urine.  He was only able to tolerate 3 hours due to muscle cramps.  Also reports increased swelling to his right arm over the past week to 10 days.  He does have an AV graft in this arm which he states is mature but has never been used for dialysis. Acute worsening of the swelling in the right arm over the past 1 week with increased swelling and decreased dexterity in his right hand.  This has not been used for dialysis and never has been.   The history is provided by the patient.  Chest Pain Associated symptoms: shortness of breath and weakness   Associated symptoms: no abdominal pain, no dizziness, no fever, no headache, no nausea and no vomiting       Past Medical History:  Diagnosis Date   Arthritis    Blood transfusion without reported diagnosis    CHF (congestive heart failure) (HCC)    COPD  (chronic obstructive pulmonary disease) ([place])    Diabetes mellitus without complication ([place])    Hypertension    PTSD (post-traumatic stress disorder)    Renal disorder    Stage 5 Kidney Failure    Patient Active Problem List   Diagnosis Date Noted   AV graft malfunction ([place]) 09/01/2021   Dialysis AV fistula malfunction, initial encounter ([place]) 08/30/2021   Mood disorder ([place]) 08/30/2021   Hypoglycemia 08/04/2021   Sepsis ([place])    Severe anemia    Infection of prosthetic left knee joint ([place])    MRSA bacteremia 03/30/2021   Left knee pain    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    Fever 03/29/2021   ESRD (end stage renal disease) ([place]) 03/29/2021   COPD (chronic obstructive pulmonary disease) ([place]) 03/29/2021   Hypertension complicating diabetes ([place]) 03/29/2021   Type 2 diabetes mellitus with hyperlipidemia ([place]) 03/29/2021   PTSD (post-traumatic stress disorder) 03/29/2021   CHF (congestive heart failure) ([place]) 03/29/2021    Past Surgical History:  Procedure Laterality Date   A/V FISTULAGRAM N/A 08/30/2021   Procedure: A/V FISTULAGRAM;  Surgeon: Waynetta Sandy, MD;  Location: [place] [facility];  Service: Cardiovascular;  Laterality: N/A;   AMPUTATION Left 04/03/2021   Procedure: LEFT ABOVE-THE-KNEE AMPUTATION;  Surgeon: Melony Overly  R, MD;  Location: [place];  Service: Orthopedics;  Laterality: Left;   AV FISTULA PLACEMENT Right 08/12/2021   Procedure: INSERTION OF ARTERIOVENOUS (AV) GORE-TEX GRAFT RIGHT ARM;  Surgeon: Serafina Mitchell, MD;  Location: [place];  Service: Vascular;  Laterality: Right;   IR REMOVAL TUN CV CATH W/O FL  03/30/2021   JOINT REPLACEMENT     Bilateral knees   PERIPHERAL VASCULAR BALLOON ANGIOPLASTY Right 08/30/2021   Procedure: PERIPHERAL VASCULAR BALLOON ANGIOPLASTY;  Surgeon: Waynetta Sandy, MD;  Location: [place] [facility];  Service: Cardiovascular;  Laterality: Right;   UPPER EXTREMITY VENOGRAPHY Right  08/10/2021   Procedure: UPPER EXTREMITY VENOGRAPHY;  Surgeon: Serafina Mitchell, MD;  Location: [place] [facility];  Service: Cardiovascular;  Laterality: Right;       No family history on file.  Social History   Tobacco Use   Smoking status: Former    Types: Cigarettes    Quit date: 2012    Years since quitting: 10.9   Smokeless tobacco: Never  Vaping Use   Vaping Use: Never used  Substance Use Topics   Alcohol use: Yes    Comment: occasionally   Drug use: Not Currently    Comment: Years ago    Home Medications Prior to Admission medications   Medication Sig Start Date End Date Taking? Authorizing Provider  ACCU-CHEK GUIDE test strip 4 (four) times daily. 11/15/20   [provider]  acetaminophen (TYLENOL) 325 MG tablet Take 2 tablets (650 mg total) by mouth every 6 (six) hours as needed for moderate pain. 04/14/21   Raiford Noble Latif, DO  albuterol (PROVENTIL) (2.5 MG/3ML) 0.083% nebulizer solution Inhale 2.5 mg into the lungs every 8 (eight) hours as needed for wheezing or shortness of breath.    [provider]  albuterol (VENTOLIN HFA) 108 (90 Base) MCG/ACT inhaler Inhale 1-2 puffs into the lungs every 6 (six) hours as needed for wheezing or shortness of breath.    [provider]  amLODipine (NORVASC) 5 MG tablet Take 5 mg by mouth at bedtime. 04/17/20   [provider]  ASPIRIN LOW DOSE 81 MG EC tablet Take 81 mg by mouth daily. 02/22/21   [provider]  atorvastatin (LIPITOR) 40 MG tablet Take 40 mg by mouth daily. 02/09/21   [provider]  calcium acetate (PHOSLO) 667 MG capsule Take 2 capsules (1,334 mg total) by mouth with breakfast, with lunch, and with evening meal. 04/10/21   Debbe Odea, MD  carvedilol (COREG) 12.5 MG tablet Take 12.5 mg by mouth 2 (two) times daily with a meal. 04/17/20   [provider]  Cholecalciferol 50 MCG (2000 UT) CAPS Take 2,000 Units by mouth daily. 08/24/20   [provider]  Continuous Blood Gluc Receiver (DEXCOM G6 RECEIVER) DEVI Use 1 Device continuously 03/08/21   [provider]  diphenhydrAMINE (BENADRYL) 25 MG tablet Take 50 mg by mouth every 6 (six) hours as needed for allergies.    [provider]  Emollient (OINTMENT BASE) OINT Apply 1 application topically daily as needed (dry skin). 06/23/21 06/23/22  [provider]  glucagon 1 MG injection Inject 1 mg into the muscle as needed (low blood sugar). 06/23/21   [provider]  insulin regular human CONCENTRATED (HUMULIN R U-500 KWIKPEN) 500 UNIT/ML KwikPen Inject 80-140 Units into the skin 2 (two) times daily with a meal. For dialysis days give 80 units in am, if tray comes early to go to dialysis  early may given medication 1-2 hours earlier, page MD for questions - If BG < 70, treat per hypoglycemia protocol. - If BG < 70 or NPO or any other dosing concerns, page MD. Note this insulin is 5 times as concentrated as regular insulin U-100. Verify dose and volume prior to administration. 0   CONCENTRATED insulin REGULAR (HUMULIN R U-500 "CONCENTRATED") 500 unit/mL injection 140 Units, Subcutaneous, User specified (Once per day on Mon Wed Fri Sun).  Instructions:   Non-Dialysis days give 140 units in the am - If NPO, hold dose. - If BG < 70, treat per hypoglycemia protocol. - If BG < 70 or any other dosing concerns, page MD. Note this insulin is 5 times as concentrated as regular insulin U-100. Verify dose and volume prior to administration. 0 08/31/21   Domenic Polite, MD  insulin regular human CONCENTRATED (HUMULIN R U-500 KWIKPEN) 500 UNIT/ML KwikPen Inject 45 Units into the skin daily with supper. 09/04/21 10/04/21  Bonnell Public, MD  insulin regular human CONCENTRATED (HUMULIN R U-500 KWIKPEN) 500 UNIT/ML KwikPen Inject 60 Units into the skin daily with breakfast. 09/05/21 10/05/21  [name], MD  lidocaine (LIDODERM) 5 % Place 3 patches onto the skin  daily. Remove & Discard patch within 12 hours or as directed by MD 08/17/21   Alma Friendly, MD  metolazone (ZAROXOLYN) 10 MG tablet Take 10 mg by mouth daily. 07/13/21   [provider]  nortriptyline (PAMELOR) 10 MG capsule Take 20 mg by mouth at bedtime. 10/23/19 02/22/22  [provider]  Nutritional Supplements (,FEEDING SUPPLEMENT, PROSOURCE PLUS) liquid Take 30 mLs by mouth 2 (two) times daily between meals. 04/11/21   Debbe Odea, MD  oxyCODONE-acetaminophen (PERCOCET/ROXICET) 5-325 MG tablet Take 1 tablet by mouth every 6 (six) hours as needed for moderate pain or severe pain. 08/31/21   Domenic Polite, MD  pantoprazole (PROTONIX) 20 MG tablet Take 20 mg by mouth daily. 02/09/21   [provider]  polyethylene glycol powder (GLYCOLAX/MIRALAX) 17 GM/SCOOP powder Take 17 g by mouth daily as needed for mild constipation. 08/24/20   [provider]  pregabalin (LYRICA) 25 MG capsule Take 1 capsule (25 mg total) by mouth 2 (two) times daily. 08/17/21   Alma Friendly, MD  senna-docusate (SENOKOT-S) 8.6-50 MG tablet Take 2 tablets by mouth 2 (two) times daily as needed for mild constipation. 10/23/19   [provider]  sertraline (ZOLOFT) 100 MG tablet Take 100 mg by mouth daily. 02/22/21   [provider]  torsemide (DEMADEX) 100 MG tablet Take 1 tablet (100 mg total) by mouth every Tuesday, Thursday, Saturday, and Sunday. 08/17/21   Alma Friendly, MD    Allergies    Bupropion, Dexmedetomidine, Ibuprofen, Pioglitazone, and Tomato  Review of Systems   Review of Systems  Constitutional:  Negative for activity change, appetite change and fever.  HENT:  Negative for congestion and rhinorrhea.   Respiratory:  Positive for chest tightness and shortness of breath.   Cardiovascular:  Positive for chest pain.  Gastrointestinal:  Negative for abdominal pain, nausea and vomiting.  Genitourinary:  Negative for dysuria and hematuria.   Musculoskeletal:  Positive for arthralgias and myalgias.  Skin:  Negative for wound.  Neurological:  Positive for weakness. Negative for dizziness and headaches.   all other systems are negative except as noted in the HPI and PMH.   Physical Exam Updated Vital Signs BP (!) 145/57   Pulse 90   Temp 98.5 F (  36.9 C)   Resp 15   SpO2 99%   Physical Exam Vitals and nursing note reviewed.  Constitutional:      General: He is not in acute distress.    Appearance: He is well-developed.     Comments: Speaking in full sentences, no distress  HENT:     Head: Normocephalic and atraumatic.     Mouth/Throat:     Pharynx: No oropharyngeal exudate.  Eyes:     Conjunctiva/sclera: Conjunctivae normal.     Pupils: Pupils are equal, round, and reactive to light.  Neck:     Comments: No meningismus. Cardiovascular:     Rate and Rhythm: Normal rate and regular rhythm.     Heart sounds: Normal heart sounds. No murmur heard.    Comments: Dialysis catheter L chest.  Pulmonary:     Effort: Pulmonary effort is normal. No respiratory distress.     Breath sounds: Normal breath sounds.  Abdominal:     Palpations: Abdomen is soft.     Tenderness: There is no abdominal tenderness. There is no guarding or rebound.  Musculoskeletal:        General: Swelling and tenderness present.     Cervical back: Normal range of motion and neck supple.     Comments: Diffuse swelling of RUE.  AV graft present with thrill and bruit and swelling. Intact Radial pulse.  Cardinal hand movements intact.   L AKA.  R ankle diffusely swollen. Intact DP and PT pulses Apparent external rotation of foot and valgus angulation  Skin:    General: Skin is warm.  Neurological:     Mental Status: He is alert and oriented to person, place, and time.     Cranial Nerves: No cranial nerve deficit.     Motor: No abnormal muscle tone.     Coordination: Coordination normal.     Comments:  5/5 strength throughout. CN 2-12  intact.Equal grip strength.   Psychiatric:        Behavior: Behavior normal.    ED Results / Procedures / Treatments   Labs (all labs ordered are listed, but only abnormal results are displayed) Labs Reviewed  CBC WITH DIFFERENTIAL/PLATELET - Abnormal; Notable for the following components:      Result Value   RBC 3.47 (*)    Hemoglobin 9.5 (*)    HCT 31.5 (*)    RDW 17.2 (*)    Abs Immature Granulocytes 0.10 (*)    All other components within normal limits  COMPREHENSIVE METABOLIC PANEL - Abnormal; Notable for the following components:   Potassium 3.1 (*)    Chloride 93 (*)    CO2 33 (*)    BUN 70 (*)    Creatinine, Ser 3.77 (*)    Calcium 8.6 (*)    GFR, Estimated 17 (*)    All other components within normal limits  URINALYSIS, ROUTINE W REFLEX MICROSCOPIC - Abnormal; Notable for the following components:   Color, Urine STRAW (*)    All other components within normal limits  MAGNESIUM - Abnormal; Notable for the following components:   Magnesium 1.4 (*)    All other components within normal limits  CBG MONITORING, ED - Abnormal; Notable for the following components:   Glucose-Capillary 115 (*)    All other components within normal limits  CBG MONITORING, ED - Abnormal; Notable for the following components:   Glucose-Capillary 69 (*)    All other components within normal limits  CBG MONITORING, ED - Abnormal; Notable for the  following components:   Glucose-Capillary 116 (*)    All other components within normal limits  TROPONIN I (HIGH SENSITIVITY) - Abnormal; Notable for the following components:   Troponin I (High Sensitivity) 18 (*)    All other components within normal limits  TROPONIN I (HIGH SENSITIVITY) - Abnormal; Notable for the following components:   Troponin I (High Sensitivity) 18 (*)    All other components within normal limits  RESP PANEL BY RT-PCR (FLU A&B, COVID) ARPGX2  URINE CULTURE  BRAIN NATRIURETIC PEPTIDE  CBG MONITORING, ED  CBG MONITORING,  ED    EKG EKG Interpretation  Date/Time:  Sunday November 07 2021 00:00:28 EST Ventricular Rate:  84 PR Interval:  142 QRS Duration: 84 QT Interval:  402 QTC Calculation: 475 R Axis:   74 Text Interpretation: Normal sinus rhythm Normal ECG No significant change was found Confirmed by Ezequiel Essex [phone]) on 11/07/2021 5:57:03 AM  Radiology DG Chest 2 View  Result Date: 11/07/2021 CLINICAL DATA:  Chest pain and shortness of breath. EXAM: CHEST - 2 VIEW COMPARISON:  Chest radiograph report 09/03/2021. FINDINGS: Dialysis catheter in similar position. Cardiomegaly with vascular congestion. No focal consolidation pneumothorax. Small bilateral pleural effusions. No acute osseous pathology. Partially visualized right shoulder arthroplasty. IMPRESSION: Cardiomegaly with vascular congestion and small bilateral pleural effusions. Electronically Signed   By: Anner Crete M.D.   On: 11/07/2021 01:47   DG Shoulder Right  Result Date: 11/07/2021 CLINICAL DATA:  Right shoulder pain. EXAM: RIGHT SHOULDER - 2+ VIEW COMPARISON:  Chest radiograph dated 11/07/2021 FINDINGS: Evaluation is limited due to body habitus and soft tissue attenuation. There is a total right shoulder arthroplasty. The arthroplasty components appear intact and in anatomic alignment. No acute fracture or dislocation. Chronic appearing bone fragment adjacent to the greater tuberosity of the humerus. IMPRESSION: No the acute fracture or dislocation. Old appearing fragment adjacent to the greater tuberosity of the humerus. Right shoulder arthroplasty appears intact. Electronically Signed   By: Anner Crete M.D.   On: 11/07/2021 01:48   DG Ankle Complete Right  Result Date: 11/07/2021 CLINICAL DATA:  Initial evaluation for acute trauma, fall, recent ankle injury. EXAM: RIGHT ANKLE - COMPLETE 3+ VIEW COMPARISON:  Prior radiograph from 08/03/2021. FINDINGS: Right ankle is imaged in the boot. No acute fracture or dislocation.  Persistent subtle asymmetric widening of the lateral ankle mortise, similar to perhaps slightly less pronounced as compared to prior. Scattered osteoarthritic changes noted about the ankle as well as the mid and hindfoot. Plantar calcaneal enthesophyte. Pes planus deformity noted. No visible soft tissue injury. IMPRESSION: 1. Persistent subtle asymmetric widening of the lateral ankle mortise, similar to perhaps slightly less pronounced as compared to prior radiograph from 08/03/2021. 2. No other acute or new osseous abnormality. Electronically Signed   By: Jeannine Boga M.D.   On: 11/07/2021 01:52    Procedures Procedures   Medications Ordered in ED Medications  aspirin chewable tablet 324 mg (has no administration in time range)    ED Course  I have reviewed the triage vital signs and the nursing notes.  Pertinent labs & imaging results that were available during my care of the patient were reviewed by me and considered in my medical decision making (see chart for details).    MDM Rules/Calculators/A&P                           Dialysis patient with multiple complaints including exertional chest pain, shortness  of breath.  EKG is sinus rhythm.  No hypoxia or increased work of breathing.  Lungs are clear.  Does have evidence of volume overload. Diffusely right arm at site of recently placed AV graft  Edema on CXR. Potassium 3.1. Troponin flat.   Exertional chest pain with SOB is concerning for angina though may also be related to volume overload. Needs dialysis but not emergently.   RUE edema at site of AV Graft. Did require intervention in October. Doppler will be obtained.   Plan admission given concerning exertional chest pain with SOB and need for dialysis.   Dw Dr Bridgett Larsson.  Final Clinical Impression(s) / ED Diagnoses Final diagnoses:  None    Rx / DC Orders ED Discharge Orders     None        Alisandra Son, Annie Main, MD 11/07/21 [phone]

## 2021-11-07 NOTE — H&P (Signed)
History and Physical    Jeffrey [name] JKK:[id number] DOB: Aug 18, 1961 DOA: 11/06/2021  Referring MD/NP/PA: Kristopher Oppenheim, DO PCP: Pcp, No  Patient coming from: Home   Chief Complaint: Chest pain  I have personally briefly reviewed patient's old medical records in [place]   HPI: Jeffrey [name] is a 60 y.o. male with medical history significant of ESRD on HD(MWF), on chronic anticoagulation, hypertension, diabetes mellitus type 2, COPD, s/p left AKA presents with complaints of left-sided chest pain starting yesterday morning while he was fixing breakfast.  Pain radiated into his left arm causing it to feel numb.  Chest pain did not initially improve when he rested.  However, reported having intermittent pains throughout the rest of the day.  Last night while he was transferring from bed to his wheelchair notes that his right ankle gave out causing him to fall and land on his buttocks.  After he fell he reported that the chest pain reoccurred.  Patient had gone to hemodialysis last on 12/7, but reports that he was having severe cramps in his legs all the way into his groin and for which he could not tolerate full session.  Reports being up 9 L and had missed hemodialysis on 12/9 due to recovering from the prior session.  Patient reports that he has been swelling all over from his ankles up into his right arm.  Right arm is 2-3 times the size of the left arm and he has decreased motion of it.  He is still using the dialysis catheter in the left chest wall because he does not regain function of his right arm since he had shoulder surgery in October.  Records note patient had been hospitalized in beginning of September and October and underwent balloon angioplasty with vascular surgery for the right upper extremity.  During this hospitalization he had been evaluated with possibility of clot but none was seen.  Lastly patient notes that he supposed be on CPAP at night, but cannot tolerate the device.  ED  Course: Upon admission into the emergency department patient was noted to be afebrile, respiration 15-23, blood pressures elevated up to 180/122, and O2 saturation currently maintained on room air.  Labs significant for hemoglobin 9.5, potassium 3.1, CO2 33, BUN 70, creatinine 3.77, glucose as low as 69, BNP 77.9, and high-sensitivity troponin 18->18.  Urinalysis without signs of infection.  Chest x-ray noted cardiomegaly with vascular congestion and small bilateral pleural effusions.  X-rays of the right shoulder did not note any acute fracture, but did not old appearing fragment adjacent to the greater tuberosity of the humerus.  X-rays of the right ankle were noted to be similar to imaging performed in September.  He has been given 325 mg of aspirin.  Accepted to a medical telemetry bed as observation.   Review of Systems  Constitutional:  Negative for fever and malaise/fatigue.  HENT:  Negative for ear discharge and nosebleeds.   Eyes:  Negative for photophobia and pain.  Respiratory:  Positive for shortness of breath. Negative for cough.   Cardiovascular:  Positive for chest pain and leg swelling.  Gastrointestinal:  Negative for abdominal pain, diarrhea and vomiting.  Genitourinary:  Negative for dysuria.  Musculoskeletal:  Positive for falls, joint pain and myalgias.  Skin:  Negative for rash.  Neurological:  Negative for loss of consciousness and weakness.  Psychiatric/Behavioral:  Negative for memory loss and substance abuse.    Past Medical History:  Diagnosis Date   Arthritis  Blood transfusion without reported diagnosis    CHF (congestive heart failure) (HCC)    COPD (chronic obstructive pulmonary disease) ([place])    Diabetes mellitus without complication ([place])    Hypertension    PTSD (post-traumatic stress disorder)    Renal disorder    Stage 5 Kidney Failure    Past Surgical History:  Procedure Laterality Date   A/V FISTULAGRAM N/A 08/30/2021   Procedure: A/V FISTULAGRAM;   Surgeon: Waynetta Sandy, MD;  Location: [facility];  Service: Cardiovascular;  Laterality: N/A;   AMPUTATION Left 04/03/2021   Procedure: LEFT ABOVE-THE-KNEE AMPUTATION;  Surgeon: Erle Crocker, MD;  Location: [facility];  Service: Orthopedics;  Laterality: Left;   AV FISTULA PLACEMENT Right 08/12/2021   Procedure: INSERTION OF ARTERIOVENOUS (AV) GORE-TEX GRAFT RIGHT ARM;  Surgeon: Serafina Mitchell, MD;  Location: [facility];  Service: Vascular;  Laterality: Right;   IR REMOVAL TUN CV CATH W/O FL  03/30/2021   JOINT REPLACEMENT     Bilateral knees   PERIPHERAL VASCULAR BALLOON ANGIOPLASTY Right 08/30/2021   Procedure: PERIPHERAL VASCULAR BALLOON ANGIOPLASTY;  Surgeon: Waynetta Sandy, MD;  Location: [facility];  Service: Cardiovascular;  Laterality: Right;   UPPER EXTREMITY VENOGRAPHY Right 08/10/2021   Procedure: UPPER EXTREMITY VENOGRAPHY;  Surgeon: Serafina Mitchell, MD;  Location: [facility];  Service: Cardiovascular;  Laterality: Right;     reports that he quit smoking about 10 years ago. His smoking use included cigarettes. He has never used smokeless tobacco. He reports current alcohol use. He reports that he does not currently use drugs.  Allergies  Allergen Reactions   Bupropion Swelling   Dexmedetomidine Nausea And Vomiting    Other reaction(s): Other (See Comments) Dose-limiting bradycardia Dose-limiting bradycardia    Ibuprofen Shortness Of Breath and Swelling    Pt tolerates aspirin Pt tolerates aspirin    Pioglitazone Anaphylaxis and Rash   Tomato Anaphylaxis    Only allergic to RAW tomatoes    No family history on file.  Prior to Admission medications   Medication Sig Start Date End Date Taking? Authorizing Provider  ACCU-CHEK GUIDE test strip 4 (four) times daily. 11/15/20  Yes [provider]  acetaminophen (TYLENOL) 325 MG tablet Take 2 tablets (650 mg total) by mouth every 6 (six) hours as needed for moderate pain.  04/14/21  Yes Sheikh, Omair Latif, DO  albuterol (PROVENTIL) (2.5 MG/3ML) 0.083% nebulizer solution Inhale 2.5 mg into the lungs every 8 (eight) hours as needed for wheezing or shortness of breath.   Yes [provider]  albuterol (VENTOLIN HFA) 108 (90 Base) MCG/ACT inhaler Inhale 1-2 puffs into the lungs every 6 (six) hours as needed for wheezing or shortness of breath.   Yes [provider]  amLODipine (NORVASC) 10 MG tablet Take 10 mg by mouth at bedtime. 04/17/20  Yes [provider]  apixaban (ELIQUIS) 2.5 MG TABS tablet Take 2.5 mg by mouth in the morning and at bedtime. 09/15/21  Yes [provider]  ASPIRIN LOW DOSE 81 MG EC tablet Take 81 mg by mouth daily. 02/22/21  Yes [provider]  atorvastatin (LIPITOR) 40 MG tablet Take 40 mg by mouth daily. 02/09/21  Yes [provider]  calcium acetate (PHOSLO) 667 MG capsule Take 2 capsules (1,334 mg total) by mouth with breakfast, with lunch, and with evening meal. 04/10/21  Yes Rizwan, Eunice Blase, MD  carvedilol (COREG) 12.5 MG tablet Take 12.5 mg by mouth 2 (two) times  daily with a meal. 04/17/20  Yes [provider]  Cholecalciferol 50 MCG (2000 UT) CAPS Take 2,000 Units by mouth daily. 08/24/20  Yes [provider]  Continuous Blood Gluc Receiver (DEXCOM G6 RECEIVER) DEVI Use 1 Device continuously 03/08/21  Yes [provider]  diphenhydrAMINE (BENADRYL) 25 MG tablet Take 50 mg by mouth every 6 (six) hours as needed for allergies.   Yes [provider]  Emollient (OINTMENT BASE) OINT Apply 1 application topically daily as needed (dry skin). 06/23/21 06/23/22 Yes [provider]  glucagon 1 MG injection Inject 1 mg into the muscle as needed (low blood sugar). 06/23/21  Yes [provider]  insulin regular human CONCENTRATED (HUMULIN R U-500 KWIKPEN) 500 UNIT/ML KwikPen Inject 60 Units into the skin daily with breakfast. Patient taking differently:  Inject 100-125 Units into the skin See admin instructions. 125 units with breakfast 100 units in the evening 09/05/21 11/07/21 Yes Ogbata, Babs Bertin, MD  metolazone (ZAROXOLYN) 10 MG tablet Take 10 mg by mouth daily. 07/13/21  Yes [provider]  nortriptyline (PAMELOR) 10 MG capsule Take 20 mg by mouth at bedtime. 10/23/19 02/22/22 Yes [provider]  Nutritional Supplements (,FEEDING SUPPLEMENT, PROSOURCE PLUS) liquid Take 30 mLs by mouth 2 (two) times daily between meals. 04/11/21  Yes Debbe Odea, MD  oxyCODONE-acetaminophen (PERCOCET/ROXICET) 5-325 MG tablet Take 1 tablet by mouth every 6 (six) hours as needed for moderate pain or severe pain. 08/31/21  Yes Domenic Polite, MD  pantoprazole (PROTONIX) 20 MG tablet Take 20 mg by mouth daily. 02/09/21  Yes [provider]  polyethylene glycol powder (GLYCOLAX/MIRALAX) 17 GM/SCOOP powder Take 17 g by mouth daily as needed for mild constipation. 08/24/20  Yes [provider]  senna-docusate (SENOKOT-S) 8.6-50 MG tablet Take 2 tablets by mouth 2 (two) times daily as needed for mild constipation. 10/23/19  Yes [provider]  sertraline (ZOLOFT) 100 MG tablet Take 100 mg by mouth daily. 02/22/21  Yes [provider]  torsemide (DEMADEX) 100 MG tablet Take 1 tablet (100 mg total) by mouth every Tuesday, Thursday, Saturday, and Sunday. 08/17/21  Yes Alma Friendly, MD  insulin regular human CONCENTRATED (HUMULIN R U-500 KWIKPEN) 500 UNIT/ML KwikPen Inject 80-140 Units into the skin 2 (two) times daily with a meal. For dialysis days give 80 units in am, if tray comes early to go to dialysis early may given medication 1-2 hours earlier, page MD for questions - If BG < 70, treat per hypoglycemia protocol. - If BG < 70 or NPO or any other dosing concerns, page MD. Note this insulin is 5 times as concentrated as regular insulin U-100. Verify dose and volume prior to administration. 0   CONCENTRATED  insulin REGULAR (HUMULIN R U-500 "CONCENTRATED") 500 unit/mL injection 140 Units, Subcutaneous, User specified (Once per day on Mon Wed Fri Sun).  Instructions:   Non-Dialysis days give 140 units in the am - If NPO, hold dose. - If BG < 70, treat per hypoglycemia protocol. - If BG < 70 or any other dosing concerns, page MD. Note this insulin is 5 times as concentrated as regular insulin U-100. Verify dose and volume prior to administration. 0 08/31/21   Domenic Polite, MD  insulin regular human CONCENTRATED (HUMULIN R U-500 KWIKPEN) 500 UNIT/ML KwikPen Inject 45 Units into the skin daily with supper. Patient not taking: Reported on 11/07/2021 09/04/21 10/04/21  [name], MD  lidocaine (LIDODERM) 5 % Place 3 patches onto the skin  daily. Remove & Discard patch within 12 hours or as directed by MD Patient not taking: Reported on 11/07/2021 08/17/21   Alma Friendly, MD  pregabalin (LYRICA) 25 MG capsule Take 1 capsule (25 mg total) by mouth 2 (two) times daily. Patient not taking: Reported on 11/07/2021 08/17/21   Alma Friendly, MD    Physical Exam:  Constitutional: Morbidly obese male currently in no acute distress Vitals:   11/07/21 0600 11/07/21 0630 11/07/21 0645 11/07/21 0815  BP: (!) 159/69 (!) 168/69 (!) 162/71 (!) 163/63  Pulse: 93 91 88 88  Resp: 20 16 (!) 23 15  Temp:      TempSrc:      SpO2: 95% 94% 97% 96%   Eyes: PERRL, lids and conjunctivae normal ENMT: Mucous membranes are moist. Posterior pharynx clear of any exudate or lesions.   Neck: Crease neck circumference.  JVD unable to be assessed. Respiratory: Normal respiratory effort with decreased overall aeration and some bibasilar crackles appreciated.  Patient currently maintaining O2 saturations on room air. Cardiovascular: Regular rate and rhythm, no murmurs / rubs / gallops.  2+ pitting edema noted of the right lower leg.  Right arm is 2-3 times the size of the left arm.  AV fistula of the right arm with  palpable thrill.   Abdomen: Protuberant abdomen, no tenderness, no masses palpated.   Bowel sounds positive.  Musculoskeletal: no clubbing / cyanosis.  Patient with significantly decreased range of motion right arm.  Healed surgical scar from right Shoulder surgery.  Left above-knee amputation.  Right foot externally rotated tenderness to palpation. Skin: no rashes, lesions, ulcers. No induration Neurologic: CN 2-12 grossly intact. Sensation intact, DTR normal. Strength 5/5 in all 4.  Psychiatric: Normal judgment and insight. Alert and oriented x 3. Normal mood.     Labs on Admission: I have personally reviewed following labs and imaging studies  CBC: Recent Labs  Lab 11/07/21 0053  WBC 8.5  NEUTROABS 6.4  HGB 9.5*  HCT 31.5*  MCV 90.8  PLT 263   Basic Metabolic Panel: Recent Labs  Lab 11/07/21 0053  NA 140  K 3.1*  CL 93*  CO2 33*  GLUCOSE 96  BUN 70*  CREATININE 3.77*  CALCIUM 8.6*  MG 1.4*   GFR: CrCl cannot be calculated (Unknown ideal weight.). Liver Function Tests: Recent Labs  Lab 11/07/21 0053  AST 15  ALT 12  ALKPHOS 64  BILITOT 0.3  PROT 7.3  ALBUMIN 3.5   No results for input(s): LIPASE, AMYLASE in the last 168 hours. No results for input(s): AMMONIA in the last 168 hours. Coagulation Profile: No results for input(s): INR, PROTIME in the last 168 hours. Cardiac Enzymes: No results for input(s): CKTOTAL, CKMB, CKMBINDEX, TROPONINI in the last 168 hours. BNP (last 3 results) No results for input(s): PROBNP in the last 8760 hours. HbA1C: No results for input(s): HGBA1C in the last 72 hours. CBG: Recent Labs  Lab 11/07/21 0143 11/07/21 0230 11/07/21 0403 11/07/21 0427 11/07/21 0502  GLUCAP 73 115* 71 69* 116*   Lipid Profile: No results for input(s): CHOL, HDL, LDLCALC, TRIG, CHOLHDL, LDLDIRECT in the last 72 hours. Thyroid Function Tests: No results for input(s): TSH, T4TOTAL, FREET4, T3FREE, THYROIDAB in the last 72 hours. Anemia  Panel: No results for input(s): VITAMINB12, FOLATE, FERRITIN, TIBC, IRON, RETICCTPCT in the last 72 hours. Urine analysis:    Component Value Date/Time   COLORURINE STRAW (A) 11/07/2021 0033   APPEARANCEUR CLEAR 11/07/2021 0033  LABSPEC 1.006 11/07/2021 0033   PHURINE 5.0 11/07/2021 0033   GLUCOSEU NEGATIVE 11/07/2021 0033   HGBUR NEGATIVE 11/07/2021 0033   BILIRUBINUR NEGATIVE 11/07/2021 0033   KETONESUR NEGATIVE 11/07/2021 0033   PROTEINUR NEGATIVE 11/07/2021 0033   NITRITE NEGATIVE 11/07/2021 0033   LEUKOCYTESUR NEGATIVE 11/07/2021 0033   Sepsis Labs: Recent Results (from the past 240 hour(s))  Resp Panel by RT-PCR (Flu A&B, Covid) Nasopharyngeal Swab     Status: None   Collection Time: 11/07/21  1:13 AM   Specimen: Nasopharyngeal Swab; Nasopharyngeal(NP) swabs in vial transport medium  Result Value Ref Range Status   SARS Coronavirus 2 by RT PCR NEGATIVE NEGATIVE Final    Comment: (NOTE) SARS-CoV-2 target nucleic acids are NOT DETECTED.  The SARS-CoV-2 RNA is generally detectable in upper respiratory specimens during the acute phase of infection. The lowest concentration of SARS-CoV-2 viral copies this assay can detect is 138 copies/mL. A negative result does not preclude SARS-Cov-2 infection and should not be used as the sole basis for treatment or other patient management decisions. A negative result may occur with  improper specimen collection/handling, submission of specimen other than nasopharyngeal swab, presence of viral mutation(s) within the areas targeted by this assay, and inadequate number of viral copies(<138 copies/mL). A negative result must be combined with clinical observations, patient history, and epidemiological information. The expected result is Negative.  Fact Sheet for Patients:  EntrepreneurPulse.com.au  Fact Sheet for Healthcare Providers:  IncredibleEmployment.be  This test is no t yet approved or cleared  by the [place] FDA and  has been authorized for detection and/or diagnosis of SARS-CoV-2 by FDA under an Emergency Use Authorization (EUA). This EUA will remain  in effect (meaning this test can be used) for the duration of the COVID-19 declaration under Section 564(b)(1) of the Act, 21 U.S.C.section 360bbb-3(b)(1), unless the authorization is terminated  or revoked sooner.       Influenza A by PCR NEGATIVE NEGATIVE Final   Influenza B by PCR NEGATIVE NEGATIVE Final    Comment: (NOTE) The Xpert Xpress SARS-CoV-2/FLU/RSV plus assay is intended as an aid in the diagnosis of influenza from Nasopharyngeal swab specimens and should not be used as a sole basis for treatment. Nasal washings and aspirates are unacceptable for Xpert Xpress SARS-CoV-2/FLU/RSV testing.  Fact Sheet for Patients: EntrepreneurPulse.com.au  Fact Sheet for Healthcare Providers: IncredibleEmployment.be  This test is not yet approved or cleared by the [place] FDA and has been authorized for detection and/or diagnosis of SARS-CoV-2 by FDA under an Emergency Use Authorization (EUA). This EUA will remain in effect (meaning this test can be used) for the duration of the COVID-19 declaration under Section 564(b)(1) of the Act, 21 U.S.C. section 360bbb-3(b)(1), unless the authorization is terminated or revoked.  Performed at [facility], [place] [address], [place], [place] [address]      Radiological Exams on Admission: DG Chest 2 View  Result Date: 11/07/2021 CLINICAL DATA:  Chest pain and shortness of breath. EXAM: CHEST - 2 VIEW COMPARISON:  Chest radiograph report 09/03/2021. FINDINGS: Dialysis catheter in similar position. Cardiomegaly with vascular congestion. No focal consolidation pneumothorax. Small bilateral pleural effusions. No acute osseous pathology. Partially visualized right shoulder arthroplasty. IMPRESSION: Cardiomegaly with vascular congestion  and small bilateral pleural effusions. Electronically Signed   By: Anner Crete M.D.   On: 11/07/2021 01:47   DG Shoulder Right  Result Date: 11/07/2021 CLINICAL DATA:  Right shoulder pain. EXAM: RIGHT SHOULDER - 2+ VIEW COMPARISON:  Chest radiograph dated 11/07/2021 FINDINGS: Evaluation is limited due to body habitus and soft tissue attenuation. There is a total right shoulder arthroplasty. The arthroplasty components appear intact and in anatomic alignment. No acute fracture or dislocation. Chronic appearing bone fragment adjacent to the greater tuberosity of the humerus. IMPRESSION: No the acute fracture or dislocation. Old appearing fragment adjacent to the greater tuberosity of the humerus. Right shoulder arthroplasty appears intact. Electronically Signed   By: Anner Crete M.D.   On: 11/07/2021 01:48   DG Ankle Complete Right  Result Date: 11/07/2021 CLINICAL DATA:  Initial evaluation for acute trauma, fall, recent ankle injury. EXAM: RIGHT ANKLE - COMPLETE 3+ VIEW COMPARISON:  Prior radiograph from 08/03/2021. FINDINGS: Right ankle is imaged in the boot. No acute fracture or dislocation. Persistent subtle asymmetric widening of the lateral ankle mortise, similar to perhaps slightly less pronounced as compared to prior. Scattered osteoarthritic changes noted about the ankle as well as the mid and hindfoot. Plantar calcaneal enthesophyte. Pes planus deformity noted. No visible soft tissue injury. IMPRESSION: 1. Persistent subtle asymmetric widening of the lateral ankle mortise, similar to perhaps slightly less pronounced as compared to prior radiograph from 08/03/2021. 2. No other acute or new osseous abnormality. Electronically Signed   By: Jeannine Boga M.D.   On: 11/07/2021 01:52    EKG: Independently reviewed.  Normal sinus rhythm 84 bpm  Assessment/Plan Chest pain elevated troponin: Patient presented with left-sided chest pain with radiation down left arm.  High-sensitivity  troponins essentially flat at 18.  EKG without significant ischemic changes chest x-ray noting cardiomegaly with vascular congestion and small bilateral pleural effusions.  Patient had been given full dose aspirin x1 dose.  Last echocardiogram revealed EF of 55 to 60% with grade 2 diastolic dysfunction back in 03/2021. Question if secondary to demand in setting of fluid overload.   -Admit to a telemetry bed -Continue aspirin and statin -Check echocardiogram -Consider need to formally consult cardiology if change in   Fluid overload ESRD on HD: Patient reports that he missed hemodialysis due to cramping on 12/9.  Noted to be acutely fluid overloaded vascular congestion and pleural effusions noted on chest x-ray.  BNP was not elevated.   -HD per nephrology  Hypokalemia hypomagnesemia: Acute.  Patient potassium 3.1 and magnesium 1.4. Suspect cramps related to his electrolyte abnormalities which is making it hard for the patient to tolerate dialysis. -Orders placed initially for potassium chloride 40 mEq p.o. x1 dose, but after talks with nephrology they will increase K bath with dialysis and recheck. -Give magnesium oxide 800 mg p.o. x2 doses -Continue to monitor  Right arm swelling: Acute on chronic.  Patient reports worsening swelling of the right arm where he is recently had right shoulder surgery in October.  He has fistula on the right upper extremity that has not been used yet due to patient not having good function of his arm since his shoulder surgery.  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 -Follow-up vascular Doppler ultrasound of the right upper extremity -May warrant CT imaging of the right upper extremity -NPO after midnight for possible need of -Vascular surgery Dr. Trula Slade consulted  Hypertensive urgency: Acute. Blood pressures initially elevated up to 180/122.  Home blood pressure regimen  includes amlodipine 10 mg nightly, Coreg 12.5 mg twice daily, torsemide 100 mg TTSS, and metolazone 10 mg daily.  -Continue home blood pressure regimen as tolerated  Anemia chronic kidney disease: Hemoglobin 9.5 which appears near patient's baseline which ranges from 8-9. -Recheck CBC tomorrow morning  On chronic coagulation: Patient reports compliance with Eliquis. -Continue Eliquis  Fall right ankle pain as well: Patient fell yesterday after right ankle gave way.  X-rays note similar widening of the lateral ankle mortise.  MRIs from September noted chronic appearing ligament tear.  He has been using the boot that was given to him during his previous hospitalization. -PT to eval and treat  Diabetes mellitus type 2, uncontrolled hypoglycemia: Last hemoglobin A1c 9.5.  Home regimen includes Humulin R 500 dialysis days75 units every morning and 125 units nightly   nondialysis days 150 units every morning and 135 units nightly. Initial glucose checks were noted to be as low as 69 in the ED, but after dialysis blood sugar over 400.  Followed in outpatient setting by endocrinology at [facility]. -Hypoglycemic protocols -Continue dialysis day regimen of 75 units every morning and 125 units at night -CBGs before every meal with very sensitive SSI -Adjust insulin regimen as needed  Depression -Continue Zoloft and nortriptyline  Decreased urinary stream: Patient reports having difficulty urinating with decreased stream and discomfort.  Urinalysis without signs of infection.  Suspect possibly related with BPH. -Trial of Flomax  Status post left AKA: Patient usually able to transfer with use of the right foot  PTSD  Morbid obesity obstructive sleep apnea: Patient reports that he cannot tolerate CPAP at night.  DVT prophylaxis: Eliquis Code Status: Full Disposition Plan: Hopefully discharge home once medically stable Consults called: Vascular surgery Admission status: Observation   Norval Morton MD [facility]   If 7PM-7AM, please contact night-coverage   11/07/2021, 9:13 AM

## 2021-11-07 NOTE — Progress Notes (Signed)
Right upper extremity venous duplex and right AVG duplex completed. Refer to "CV Proc" under chart review to view preliminary results.  11/07/2021 1:51 PM Kelby Aline., MHA, RVT, RDCS, RDMS

## 2021-11-07 NOTE — Progress Notes (Signed)
Patient admitted to [facility]. VSS, heart monitor applied and CCMD notified, CHG bath done. Patient complaining of 10/10 painful cramps in his groin. Did not want PO oxycodone and asked for IV dilaudid. Patient's home blood sugar monitor reading over 400. MD notified.  Edwena Blow, RN

## 2021-11-07 NOTE — Progress Notes (Signed)
Patient started screaming in pain after 2hrs of dialysis due to cramping in his groin and shoulder. UF was turned off and patient continued to cry/yell out in pain. He insisted on being taken off early.  Treatment terminated.  Dicky Doe PA  notified and will order for HD tomorrow.

## 2021-11-07 NOTE — ED Provider Notes (Signed)
Emergency Medicine Provider Triage Evaluation Note  Jeffrey [name] , a 60 y.o. male  was evaluated in triage.  Presents to the emergency department with numerous complaints.  Initial complaint is right arm swelling.  Patient reports he had new graft placed in the right upper arm in October here at [facility].  Reports he had right shoulder surgery October 18 as well.  He states he has had some swelling in the right arm with an acute worsening in the last week.  Reports he now feels like he has less dexterity in his right hand because of the swelling.  They are not currently using his graft for dialysis.    Patient with a history of ESRD.  On dialysis Monday Wednesday Friday with a catheter in his left upper chest.  Patient reports his last dialysis was on Wednesday.  He reports he was only able to tolerate 3 hours due to severe muscle cramps.  Reports he believes he is 9 L over his dry weight but they have had difficulty pulling and off during his dialysis sessions.  Patient reports pain in his right ankle.  Reports he was recently fractured and tonight when attempting to transfer from bed to wheelchair he fell.  Denies hitting his head.  Reports worsening pain in the right ankle and now in the right shoulder.  Patient also complaining of chest pain onset 6 AM this morning.  Reports it is worse with exertion.  Believes he is fluid overloaded as he also has some shortness of breath.  Patient does have a history of CHF and COPD.  Patient also complaining of dysuria and is concerned he has a urinary tract infection.  Review of Systems  Positive: Chest pain, shortness of breath, right arm swelling, right ankle pain, right shoulder pain Negative: Nausea vomiting diarrhea  Physical Exam  BP (!) 180/122 (BP Location: Right Arm)   Pulse 92   Temp 98 F (36.7 C) (Oral)   Resp 16   SpO2 100%   Gen:   Awake, no distress   Resp:  Normal effort, diminished breath sounds throughout. MSK:   Decreased range  of motion of the right shoulder due to pain.  Significant peripheral edema in the right upper extremity.  Faint but palpable thrill in the right upper arm graft. Other:  Boot in place on the right ankle.  Medical Decision Making  Medically screening exam initiated at 12:48 AM.  Appropriate orders placed.  Jeffrey [name] was informed that the remainder of the evaluation will be completed by another provider, this initial triage assessment does not replace that evaluation, and the importance of remaining in the ED until their evaluation is complete.   Patient here with numerous complaints.  Most concerning is fluid overload, missed dialysis and chest pain.  Work-up pending.    Jeffrey [name] 11/07/21 0048    Fatima Blank, MD 11/07/21 [phone]

## 2021-11-07 NOTE — Progress Notes (Signed)
Pharmacy Home Medication Reconciliation Communication  High Risk Medication: Humulin U-500 Insulin  Home dose of Humulin U-500 insulin was reordered for this patient. The dose was verified with the patient as listed below:  Type of syringe used at home:  [facility] Number or line on syringe to which patient draws up insulin:  On Non-HD days -Takes 150 units in AM and 135 units in PM  HD -Takes 75 units in AM and 125 units in PM  Other comments pertinent to patient home dosing: very familiar with regimen and follows closely with Endocrinology - restarting at reduced dosing similar to HD days  Antonietta Jewel, PharmD, [place] Pharmacist  Phone: [phone] 11/07/2021 7:09 PM  Please check AMION for all [place] phone numbers After 10:00 PM, call [place] [phone]

## 2021-11-07 NOTE — Progress Notes (Signed)
Patient started screaming in pain after 2hrs of dialysis due to cramping in his groin and shoulder.  Blood pressure at goal.  UF was turned off and patient continued to cry/yell out in pain. He insisted on being taken off early.  Treatment terminated.  Orders adjusted for HD tomorrow so patient will hopefully better tolerate.  Jen Mow, PA-C [facility] Pager: [phone]

## 2021-11-08 ENCOUNTER — Ambulatory Visit (HOSPITAL_COMMUNITY)
Admission: EM | Disposition: A | Discharge status: Home Health | Payer: Self-pay | Source: Home / Self Care | Attending: Emergency Medicine

## 2021-11-08 ENCOUNTER — Observation Stay (HOSPITAL_BASED_OUTPATIENT_CLINIC_OR_DEPARTMENT_OTHER): Payer: Medicare Other

## 2021-11-08 DIAGNOSIS — R9431 Abnormal electrocardiogram [ECG] [EKG]: Secondary | ICD-10-CM

## 2021-11-08 DIAGNOSIS — R600 Localized edema: Secondary | ICD-10-CM

## 2021-11-08 DIAGNOSIS — D638 Anemia in other chronic diseases classified elsewhere: Secondary | ICD-10-CM | POA: Diagnosis not present

## 2021-11-08 DIAGNOSIS — E1122 Type 2 diabetes mellitus with diabetic chronic kidney disease: Secondary | ICD-10-CM

## 2021-11-08 DIAGNOSIS — Z794 Long term (current) use of insulin: Secondary | ICD-10-CM

## 2021-11-08 DIAGNOSIS — Z79899 Other long term (current) drug therapy: Secondary | ICD-10-CM

## 2021-11-08 DIAGNOSIS — Z87891 Personal history of nicotine dependence: Secondary | ICD-10-CM

## 2021-11-08 DIAGNOSIS — R079 Chest pain, unspecified: Secondary | ICD-10-CM | POA: Diagnosis not present

## 2021-11-08 DIAGNOSIS — Z7982 Long term (current) use of aspirin: Secondary | ICD-10-CM

## 2021-11-08 DIAGNOSIS — Z992 Dependence on renal dialysis: Secondary | ICD-10-CM | POA: Diagnosis not present

## 2021-11-08 DIAGNOSIS — T82898A Other specified complication of vascular prosthetic devices, implants and grafts, initial encounter: Secondary | ICD-10-CM

## 2021-11-08 DIAGNOSIS — N186 End stage renal disease: Secondary | ICD-10-CM | POA: Diagnosis not present

## 2021-11-08 DIAGNOSIS — T82858A Stenosis of vascular prosthetic devices, implants and grafts, initial encounter: Secondary | ICD-10-CM | POA: Diagnosis not present

## 2021-11-08 HISTORY — PX: PERIPHERAL VASCULAR INTERVENTION: CATH118257

## 2021-11-08 HISTORY — PX: A/V SHUNTOGRAM: CATH118297

## 2021-11-08 LAB — CBC
HCT: 34 % — ABNORMAL LOW (ref 39.0–52.0)
Hemoglobin: 10.5 g/dL — ABNORMAL LOW (ref 13.0–17.0)
MCH: 27.6 pg (ref 26.0–34.0)
MCHC: 30.9 g/dL (ref 30.0–36.0)
MCV: 89.5 fL (ref 80.0–100.0)
Platelets: 122 10*3/uL — ABNORMAL LOW (ref 150–400)
RBC: 3.8 MIL/uL — ABNORMAL LOW (ref 4.22–5.81)
RDW: 16.9 % — ABNORMAL HIGH (ref 11.5–15.5)
WBC: 7.2 10*3/uL (ref 4.0–10.5)
nRBC: 0 % (ref 0.0–0.2)

## 2021-11-08 LAB — GLUCOSE, CAPILLARY
Glucose-Capillary: 249 mg/dL — ABNORMAL HIGH (ref 70–99)
Glucose-Capillary: 100 mg/dL — ABNORMAL HIGH (ref 70–99)
Glucose-Capillary: 372 mg/dL — ABNORMAL HIGH (ref 70–99)

## 2021-11-08 LAB — RENAL FUNCTION PANEL
Albumin: 3.5 g/dL (ref 3.5–5.0)
Anion gap: 14 (ref 5–15)
BUN: 63 mg/dL — ABNORMAL HIGH (ref 8–23)
CO2: 28 mmol/L (ref 22–32)
Calcium: 8.4 mg/dL — ABNORMAL LOW (ref 8.9–10.3)
Chloride: 93 mmol/L — ABNORMAL LOW (ref 98–111)
Creatinine, Ser: 3.9 mg/dL — ABNORMAL HIGH (ref 0.61–1.24)
GFR, Estimated: 17 mL/min — ABNORMAL LOW (ref >60)
Glucose, Bld: 311 mg/dL — ABNORMAL HIGH (ref 70–99)
Phosphorus: 4.4 mg/dL (ref 2.5–4.6)
Potassium: 3.4 mmol/L — ABNORMAL LOW (ref 3.5–5.1)
Sodium: 135 mmol/L (ref 135–145)

## 2021-11-08 LAB — MAGNESIUM: Magnesium: 1.8 mg/dL (ref 1.7–2.4)

## 2021-11-08 LAB — HEMOGLOBIN A1C
Hgb A1c MFr Bld: 7.1 % — ABNORMAL HIGH (ref 4.8–5.6)
Mean Plasma Glucose: 157.07 mg/dL

## 2021-11-08 LAB — ECHOCARDIOGRAM COMPLETE
AR max vel: 2.27 cm2
AV Area VTI: 2.41 cm2
AV Area mean vel: 2.09 cm2
AV Mean grad: 6 mmHg
AV Peak grad: 10.1 mmHg
Ao pk vel: 1.59 m/s
Area-P 1/2: 3.08 cm2
Height: 65 in
S' Lateral: 3.18 cm
Weight: 4564.4 oz

## 2021-11-08 LAB — HEPATITIS B SURFACE ANTIBODY, QUANTITATIVE: Hep B S AB Quant (Post): 44.5 m[IU]/mL (ref >9.9)

## 2021-11-08 LAB — URINE CULTURE

## 2021-11-08 LAB — HEPATITIS B SURFACE ANTIBODY,QUALITATIVE: Hep B S Ab: REACTIVE — AB

## 2021-11-08 SURGERY — A/V SHUNTOGRAM
Anesthesia: LOCAL

## 2021-11-08 MED ORDER — LIDOCAINE-PRILOCAINE 2.5-2.5 % EX CREA
1.0000 "application " | TOPICAL_CREAM | CUTANEOUS | Status: DC | PRN
  Filled 2021-11-08: qty 5

## 2021-11-08 MED ORDER — ONDANSETRON HCL 4 MG/2ML IJ SOLN
INTRAMUSCULAR | Status: DC | PRN
  Administered 2021-11-08: 4 mg via INTRAVENOUS

## 2021-11-08 MED ORDER — INSULIN REGULAR HUMAN (CONC) 500 UNIT/ML ~~LOC~~ SOPN
70.0000 [IU] | PEN_INJECTOR | Freq: Every day | SUBCUTANEOUS | Status: DC
  Administered 2021-11-08 – 2021-11-09 (×2): 70 [IU] via SUBCUTANEOUS

## 2021-11-08 MED ORDER — PENTAFLUOROPROP-TETRAFLUOROETH EX AERO: 1.0000 "application " | INHALATION_SPRAY | CUTANEOUS | Status: DC | PRN

## 2021-11-08 MED ORDER — IODIXANOL 320 MG/ML IV SOLN
INTRAVENOUS | Status: DC | PRN
  Administered 2021-11-08: 50 mL

## 2021-11-08 MED ORDER — HEPARIN (PORCINE) IN NACL 1000-0.9 UT/500ML-% IV SOLN
INTRAVENOUS | Status: AC
  Filled 2021-11-08: qty 500

## 2021-11-08 MED ORDER — ONDANSETRON HCL 4 MG/2ML IJ SOLN
INTRAMUSCULAR | Status: AC
  Filled 2021-11-08: qty 2

## 2021-11-08 MED ORDER — PERFLUTREN LIPID MICROSPHERE
1.0000 mL | INTRAVENOUS | Status: AC | PRN
Start: 2021-11-08 — End: 2021-11-08
  Administered 2021-11-08: 2 mL via INTRAVENOUS
  Filled 2021-11-08: qty 10

## 2021-11-08 MED ORDER — LIDOCAINE HCL (PF) 1 % IJ SOLN: 5.0000 mL | INTRAMUSCULAR | Status: DC | PRN

## 2021-11-08 MED ORDER — SODIUM CHLORIDE 0.9 % IV SOLN: 100.0000 mL | INTRAVENOUS | Status: DC | PRN

## 2021-11-08 MED ORDER — ALTEPLASE 2 MG IJ SOLR
2.0000 mg | Freq: Once | INTRAMUSCULAR | Status: DC | PRN
  Filled 2021-11-08: qty 2

## 2021-11-08 MED ORDER — HYDROMORPHONE HCL 1 MG/ML IJ SOLN
0.5000 mg | Freq: Three times a day (TID) | INTRAMUSCULAR | Status: DC | PRN
  Administered 2021-11-08 – 2021-11-09 (×3): 0.5 mg via INTRAVENOUS
  Filled 2021-11-08 (×3): qty 0.5

## 2021-11-08 MED ORDER — HEPARIN SODIUM (PORCINE) 1000 UNIT/ML IJ SOLN
INTRAMUSCULAR | Status: AC
  Filled 2021-11-08: qty 4

## 2021-11-08 MED ORDER — OXYCODONE-ACETAMINOPHEN 5-325 MG PO TABS
1.0000 | ORAL_TABLET | Freq: Three times a day (TID) | ORAL | Status: DC | PRN
  Administered 2021-11-08 – 2021-11-09 (×2): 1 via ORAL
  Filled 2021-11-08 (×2): qty 1

## 2021-11-08 MED ORDER — HEPARIN SODIUM (PORCINE) 1000 UNIT/ML DIALYSIS
1000.0000 [IU] | INTRAMUSCULAR | Status: DC | PRN
  Filled 2021-11-08: qty 1

## 2021-11-08 MED ORDER — LIDOCAINE HCL (PF) 1 % IJ SOLN
INTRAMUSCULAR | Status: AC
  Filled 2021-11-08: qty 30

## 2021-11-08 SURGICAL SUPPLY — 13 items
COVER DOME SNAP 22 D (MISCELLANEOUS) ×3 IMPLANT
KIT ENCORE 26 ADVANTAGE (KITS) ×1 IMPLANT
KIT MICROPUNCTURE NIT STIFF (SHEATH) ×1 IMPLANT
MAT PREVALON FULL STRYKER (MISCELLANEOUS) ×1 IMPLANT
PROTECTION STATION PRESSURIZED (MISCELLANEOUS) ×3
SHEATH PINNACLE R/O II 7F 4CM (SHEATH) ×1 IMPLANT
SHEATH PROBE COVER 6X72 (BAG) ×3 IMPLANT
STATION PROTECTION PRESSURIZED (MISCELLANEOUS) ×2 IMPLANT
STENT EXPRESS LD 10X25X135 (Permanent Stent) ×1 IMPLANT
STOPCOCK MORSE 400PSI 3WAY (MISCELLANEOUS) ×3 IMPLANT
TRAY PV CATH (CUSTOM PROCEDURE TRAY) ×3 IMPLANT
TUBING CIL FLEX 10 FLL-RA (TUBING) ×3 IMPLANT
WIRE ROSEN-J .035X260CM (WIRE) ×1 IMPLANT

## 2021-11-08 NOTE — Care Management Obs Status (Signed)
[place] NOTIFICATION   Patient Details  Name: Jeffrey [name] MRN: [id number] Date of Birth: 11/01/1961   Medicare Observation Status Notification Given:  Yes    Bartholomew Crews, RN 11/08/2021, 8:15 AM

## 2021-11-08 NOTE — Progress Notes (Addendum)
[place] KIDNEY BRIEF PROGRESS NOTE  Jeffrey [name] 1961-11-14 [id number]  60 year old male ESRD patient on HD MWF at [facility], presented to the ED due to volume overload with chest pain, SOB, orthopnea and pain in his extremities.  Patient is chronically non complaint with dialysis, missing treatments and signing off early on a regular basis due to cramping per patient.  Missed last HD and left 8kg over dry weight on 12/7.    On exam edema noted in RUE > LUE, and some LE edema bilat.  Breath sounds decreased but no crackles present.  CXR showed vascular congestion, no sig edema. [facility] may be taking pt for RUE shuntogram today looking for recurrent central venous stenosis.    OP HD orders: MWF [facility]  3h  2/2.5 bath  P4  120kg  Hep 2000 Mircera 100 q2wks, last 12/1  Plan: HD today upstairs, max UF as tolerated.     Kelly Splinter, MD 11/08/2021, 12:11 PM

## 2021-11-08 NOTE — Progress Notes (Signed)
PROGRESS NOTE    Jeffrey [name]  LXB:[id number] DOB: 1961/06/12 DOA: 11/06/2021 PCP: Pcp, No   Brief Narrative:  HPI: Jeffrey [name] is a 60 y.o. male with medical history significant of ESRD on HD(MWF), on chronic anticoagulation, hypertension, diabetes mellitus type 2, COPD, s/p left AKA presents with complaints of left-sided chest pain starting yesterday morning while he was fixing breakfast.  Pain radiated into his left arm causing it to feel numb.  Chest pain did not initially improve when he rested.  However, reported having intermittent pains throughout the rest of the day.  Last night while he was transferring from bed to his wheelchair notes that his right ankle gave out causing him to fall and land on his buttocks.  After he fell he reported that the chest pain reoccurred.  Patient had gone to hemodialysis last on 12/7, but reports that he was having severe cramps in his legs all the way into his groin and for which he could not tolerate full session.  Reports being up 9 L and had missed hemodialysis on 12/9 due to recovering from the prior session.  Patient reports that he has been swelling all over from his ankles up into his right arm.  Right arm is 2-3 times the size of the left arm and he has decreased motion of it.  He is still using the dialysis catheter in the left chest wall because he does not regain function of his right arm since he had shoulder surgery in October.  Records note patient had been hospitalized in beginning of September and October and underwent balloon angioplasty with vascular surgery for the right upper extremity.  During this hospitalization he had been evaluated with possibility of clot but none was seen.  Lastly patient notes that he supposed be on CPAP at night, but cannot tolerate the device.   ED Course: Upon admission into the emergency department patient was noted to be afebrile, respiration 15-23, blood pressures elevated up to 180/122, and O2 saturation currently  maintained on room air.  Labs significant for hemoglobin 9.5, potassium 3.1, CO2 33, BUN 70, creatinine 3.77, glucose as low as 69, BNP 77.9, and high-sensitivity troponin 18->18.  Urinalysis without signs of infection.  Chest x-ray noted cardiomegaly with vascular congestion and small bilateral pleural effusions.  X-rays of the right shoulder did not note any acute fracture, but did not old appearing fragment adjacent to the greater tuberosity of the humerus.  X-rays of the right ankle were noted to be similar to imaging performed in September.  He has been given 325 mg of aspirin.  Accepted to a medical telemetry bed as observation.  Assessment & Plan:   Principal Problem:   Chest pain Active Problems:   ESRD (end stage renal disease) (HCC)   Type 2 diabetes mellitus with hyperlipidemia (HCC)   PTSD (post-traumatic stress disorder)   CHF (congestive heart failure) (HCC)   Anemia of chronic disease   Hypoglycemia   Hypertensive urgency   Swelling of right upper extremity  Chest pain elevated troponin: Patient presented with left-sided chest pain with radiation down left arm.  High-sensitivity troponins essentially flat at 18.  EKG without significant ischemic changes chest x-ray noting cardiomegaly with vascular congestion and small bilateral pleural effusions.  Likely demand ischemia.  Echo pending.  No chest pain today   Fluid overload ESRD on HD: Patient known for being noncompliant with his dialysis, often cuts his treatment in chart.  He did the same on 11/03/2021 and missed  hemodialysis due to cramping on 12/9.  Noted to be acutely fluid overloaded vascular congestion and pleural effusions noted on chest x-ray.  Nephrology consulted, they started hemodialysis but patient developed cramps and as usual insisted the dialysis session to cut in short.   Hypokalemia hypomagnesemia: Acute.  Slightly improved potassium, magnesium pending.  Will defer to nephrology.   Right arm swelling: Acute  on chronic.  Patient reports worsening swelling of the right arm where he is recently had right shoulder surgery in October.  He has fistula on the right upper extremity that has not been used yet due to patient not having good function of his arm since his shoulder surgery.  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  On examination, he does have significant edema of the right upper extremity, at least 50% more than the left.  He was seen by vascular surgery and shuntogram is a scheduled today.  Hypertensive urgency: Acute. Blood pressures initially elevated up to 180/122.  Home blood pressure regimen includes amlodipine 10 mg nightly, Coreg 12.5 mg twice daily, torsemide 100 mg TTSS, and metolazone 10 mg daily.  All of them continued and his blood pressure is much better now.   Anemia chronic kidney disease: Hemoglobin is stable.   On chronic coagulation: Patient reports compliance with Eliquis. -Continue Eliquis   Fall right ankle pain as well: Patient fell yesterday after right ankle gave way.  X-rays note similar widening of the lateral ankle mortise.  MRIs from September noted chronic appearing ligament tear.  He has been using the boot that was given to him during his previous hospitalization.  To be seen by PT OT.   Diabetes mellitus type 2, uncontrolled with alternating hyperglycemia and hypoglycemia: hemoglobin A1c 7.1.  Home regimen includes Humulin R 500 dialysis days75 units every morning and 125 units nightly   nondialysis days 150 units every morning and 135 units nightly. Initial glucose checks were noted to be as low as 69 in the ED, but after dialysis blood sugar over 400.  Followed in outpatient setting by endocrinology at [facility].  We will discontinue today's morning insulin since he is n.p.o., for diabetes coordinator recommendations.  We will continue rest of the  regimen.  Depression -Continue Zoloft and nortriptyline   Decreased urinary stream: Patient reports having difficulty urinating with decreased stream and discomfort.  Urinalysis without signs of infection.  Suspect possibly related with BPH. -Trial of Flomax   Status post left AKA: Patient usually able to transfer with use of the right foot   Morbid obesity obstructive sleep apnea: Patient reports that he cannot tolerate CPAP at night.  Multiple areas of pain: Patient on Percocet for the complaints of pain in the groin, neck and back.  He is insisting that IV Dilaudid works better for him so he is requesting to order that as backup.  Long discussion, he continued to argue and call the nurses every few minutes for IV Dilaudid.  I have reduced his Percocet frequency to every 8 hours as as needed and have added IV Dilaudid 0.5 mg every 8 hours as needed as well.  DVT prophylaxis: apixaban (ELIQUIS) tablet 2.5 mg Start: 11/07/21 1100   Code Status: Full Code  Family Communication:  None present at bedside.  Plan of care discussed with patient in length and he verbalized understanding and agreed with it.  Status is: Observation  The patient will require care  spanning > 2 midnights and should be moved to inpatient because: Needs inpatient hospitalization for multiple medical issues as mentioned above.  Estimated body mass index is 47.47 kg/m as calculated from the following:   Height as of this encounter: 5\' 5"  (1.651 m).   Weight as of this encounter: 129.4 kg.  Nutritional Assessment: Body mass index is 47.47 kg/m.Marland Kitchen Seen by dietician.  I agree with the assessment and plan as outlined below: Nutrition Status:   Skin Assessment: I have examined the patient's skin and I agree with the wound assessment as performed by the wound care RN as outlined below:  Consultants:  Nephrology and vascular surgery  Procedures:  None yet  Antimicrobials:  Anti-infectives (From admission, onward)     None          Subjective: Seen and examined.  He is focused on pain as mentioned above.  No other complaint.  Objective: Vitals:   11/08/21 0220 11/08/21 0513 11/08/21 0824 11/08/21 1049  BP:  129/67 131/69 (!) 132/59  Pulse:  81 81 80  Resp:  14 17 17   Temp:  (!) 97.4 F (36.3 C) 98.1 F (36.7 C) 97.7 F (36.5 C)  TempSrc:  Oral Oral Oral  SpO2:  92% 100%   Weight: 129.4 kg     Height:        Intake/Output Summary (Last 24 hours) at 11/08/2021 1058 Last data filed at 11/08/2021 0825 Gross per 24 hour  Intake 480 ml  Output 2400 ml  Net -1920 ml   Filed Weights   11/07/21 1720 11/08/21 0220  Weight: 129.2 kg 129.4 kg    Examination:  General exam: Appears calm and comfortable, obese Respiratory system: Clear to auscultation. Respiratory effort normal. Cardiovascular system: S1 & S2 heard, RRR. No JVD, murmurs, rubs, gallops or clicks.  Gastrointestinal system: Abdomen is nondistended, soft and nontender. No organomegaly or masses felt. Normal bowel sounds heard. Central nervous system: Alert and oriented. No focal neurological deficits. Extremities: Symmetric 5 x 5 power.  Significant edema right upper extremity.  Left AKA.  +1 pitting edema right lower extremity. Skin: No rashes, lesions or ulcers Psychiatry: Judgement and insight appear poor. Mood & affect appropriate.    Data Reviewed: I have personally reviewed following labs and imaging studies  CBC: Recent Labs  Lab 11/07/21 0053 11/08/21 0419  WBC 8.5 7.2  NEUTROABS 6.4  --   HGB 9.5* 10.5*  HCT 31.5* 34.0*  MCV 90.8 89.5  PLT 151 785*   Basic Metabolic Panel: Recent Labs  Lab 11/07/21 0053 11/07/21 1000 11/08/21 0419  NA 140  --  135  K 3.1*  --  3.4*  CL 93*  --  93*  CO2 33*  --  28  GLUCOSE 96  --  311*  BUN 70*  --  63*  CREATININE 3.77*  --  3.90*  CALCIUM 8.6*  --  8.4*  MG 1.4*  --   --   PHOS  --  4.6 4.4   GFR: Estimated Creatinine Clearance: 25 mL/min (A) (by C-G  formula based on SCr of 3.9 mg/dL (H)). Liver Function Tests: Recent Labs  Lab 11/07/21 0053 11/08/21 0419  AST 15  --   ALT 12  --   ALKPHOS 64  --   BILITOT 0.3  --   PROT 7.3  --   ALBUMIN 3.5 3.5   No results for input(s): LIPASE, AMYLASE in the last 168 hours. No results for input(s): AMMONIA in the  last 168 hours. Coagulation Profile: No results for input(s): INR, PROTIME in the last 168 hours. Cardiac Enzymes: No results for input(s): CKTOTAL, CKMB, CKMBINDEX, TROPONINI in the last 168 hours. BNP (last 3 results) No results for input(s): PROBNP in the last 8760 hours. HbA1C: Recent Labs    11/08/21 0419  HGBA1C 7.1*   CBG: Recent Labs  Lab 11/07/21 0427 11/07/21 0502 11/07/21 2100 11/08/21 0601 11/08/21 1048  GLUCAP 69* 116* 462* 249* 100*   Lipid Profile: No results for input(s): CHOL, HDL, LDLCALC, TRIG, CHOLHDL, LDLDIRECT in the last 72 hours. Thyroid Function Tests: No results for input(s): TSH, T4TOTAL, FREET4, T3FREE, THYROIDAB in the last 72 hours. Anemia Panel: No results for input(s): VITAMINB12, FOLATE, FERRITIN, TIBC, IRON, RETICCTPCT in the last 72 hours. Sepsis Labs: No results for input(s): PROCALCITON, LATICACIDVEN in the last 168 hours.  Recent Results (from the past 240 hour(s))  Urine Culture     Status: Abnormal   Collection Time: 11/07/21 12:33 AM   Specimen: Urine, Clean Catch  Result Value Ref Range Status   Specimen Description URINE, CLEAN CATCH  Final   Special Requests   Final    NONE Performed at [facility], [address], [place], [place] [address]    Culture MULTIPLE SPECIES PRESENT, SUGGEST RECOLLECTION (A)  Final   Report Status 11/08/2021 FINAL  Final  Resp Panel by RT-PCR (Flu A&B, Covid) Nasopharyngeal Swab     Status: None   Collection Time: 11/07/21  1:13 AM   Specimen: Nasopharyngeal Swab; Nasopharyngeal(NP) swabs in vial transport medium  Result Value Ref Range Status   SARS Coronavirus 2 by RT PCR  NEGATIVE NEGATIVE Final    Comment: (NOTE) SARS-CoV-2 target nucleic acids are NOT DETECTED.  The SARS-CoV-2 RNA is generally detectable in upper respiratory specimens during the acute phase of infection. The lowest concentration of SARS-CoV-2 viral copies this assay can detect is 138 copies/mL. A negative result does not preclude SARS-Cov-2 infection and should not be used as the sole basis for treatment or other patient management decisions. A negative result may occur with  improper specimen collection/handling, submission of specimen other than nasopharyngeal swab, presence of viral mutation(s) within the areas targeted by this assay, and inadequate number of viral copies(<138 copies/mL). A negative result must be combined with clinical observations, patient history, and epidemiological information. The expected result is Negative.  Fact Sheet for Patients:  EntrepreneurPulse.com.au  Fact Sheet for Healthcare Providers:  IncredibleEmployment.be  This test is no t yet approved or cleared by the [place] FDA and  has been authorized for detection and/or diagnosis of SARS-CoV-2 by FDA under an Emergency Use Authorization (EUA). This EUA will remain  in effect (meaning this test can be used) for the duration of the COVID-19 declaration under Section 564(b)(1) of the Act, 21 U.S.C.section 360bbb-3(b)(1), unless the authorization is terminated  or revoked sooner.       Influenza A by PCR NEGATIVE NEGATIVE Final   Influenza B by PCR NEGATIVE NEGATIVE Final    Comment: (NOTE) The Xpert Xpress SARS-CoV-2/FLU/RSV plus assay is intended as an aid in the diagnosis of influenza from Nasopharyngeal swab specimens and should not be used as a sole basis for treatment. Nasal washings and aspirates are unacceptable for Xpert Xpress SARS-CoV-2/FLU/RSV testing.  Fact Sheet for Patients: EntrepreneurPulse.com.au  Fact Sheet for  Healthcare Providers: IncredibleEmployment.be  This test is not yet approved or cleared by the [place] FDA and has been authorized for detection and/or  diagnosis of SARS-CoV-2 by FDA under an Emergency Use Authorization (EUA). This EUA will remain in effect (meaning this test can be used) for the duration of the COVID-19 declaration under Section 564(b)(1) of the Act, 21 U.S.C. section 360bbb-3(b)(1), unless the authorization is terminated or revoked.  Performed at [facility], [place] [address] [place], [place] [address]       Radiology Studies: DG Chest 2 View  Result Date: 11/07/2021 CLINICAL DATA:  Chest pain and shortness of breath. EXAM: CHEST - 2 VIEW COMPARISON:  Chest radiograph report 09/03/2021. FINDINGS: Dialysis catheter in similar position. Cardiomegaly with vascular congestion. No focal consolidation pneumothorax. Small bilateral pleural effusions. No acute osseous pathology. Partially visualized right shoulder arthroplasty. IMPRESSION: Cardiomegaly with vascular congestion and small bilateral pleural effusions. Electronically Signed   By: Anner Crete M.D.   On: 11/07/2021 01:47   DG Shoulder Right  Result Date: 11/07/2021 CLINICAL DATA:  Right shoulder pain. EXAM: RIGHT SHOULDER - 2+ VIEW COMPARISON:  Chest radiograph dated 11/07/2021 FINDINGS: Evaluation is limited due to body habitus and soft tissue attenuation. There is a total right shoulder arthroplasty. The arthroplasty components appear intact and in anatomic alignment. No acute fracture or dislocation. Chronic appearing bone fragment adjacent to the greater tuberosity of the humerus. IMPRESSION: No the acute fracture or dislocation. Old appearing fragment adjacent to the greater tuberosity of the humerus. Right shoulder arthroplasty appears intact. Electronically Signed   By: Anner Crete M.D.   On: 11/07/2021 01:48   DG Ankle Complete Right  Result Date: 11/07/2021 CLINICAL  DATA:  Initial evaluation for acute trauma, fall, recent ankle injury. EXAM: RIGHT ANKLE - COMPLETE 3+ VIEW COMPARISON:  Prior radiograph from 08/03/2021. FINDINGS: Right ankle is imaged in the boot. No acute fracture or dislocation. Persistent subtle asymmetric widening of the lateral ankle mortise, similar to perhaps slightly less pronounced as compared to prior. Scattered osteoarthritic changes noted about the ankle as well as the mid and hindfoot. Plantar calcaneal enthesophyte. Pes planus deformity noted. No visible soft tissue injury. IMPRESSION: 1. Persistent subtle asymmetric widening of the lateral ankle mortise, similar to perhaps slightly less pronounced as compared to prior radiograph from 08/03/2021. 2. No other acute or new osseous abnormality. Electronically Signed   By: Jeannine Boga M.D.   On: 11/07/2021 01:52   VAS US DUPLEX DIALYSIS ACCESS (AVF, AVG)  Result Date: 11/07/2021 DIALYSIS ACCESS Patient Name:  CHRISTIANJAMES SOULE  Date of Exam:   11/07/2021 Medical Rec #: [id number]    Accession #:    [id number] Date of Birth: 1961-02-16    Patient Gender: M Patient Age:   69 years Exam Location:  [facility] Procedure:      VAS US DUPLEX DIALYSIS ACCESS (AVF, AVG) Referring Phys: Ezequiel Essex --------------------------------------------------------------------------------  Reason for Exam: Right upper extremity edema. Access Site: Right Upper Extremity. Access Type: Upper arm loop AVG. Limitations: Patient body habitus, tissue properties, depth of vessels/graft,              patient position/restricted mobility Comparison Study: 08/30/2021 AVG duplex Performing Technologist: Maudry Mayhew MHA, RDMS, RVT, RDCS  Examination Guidelines: A complete evaluation includes B-mode imaging, spectral Doppler, color Doppler, and power Doppler as needed of all accessible portions of each vessel. Unilateral testing is considered an integral part of a complete examination. Limited examinations for  reoccurring indications may be performed as noted.  Findings:   +--------------------+----------+-----------------+--------+ AVG  PSV (cm/s)Flow Vol (mL/min)Describe +--------------------+----------+-----------------+--------+ Native artery inflow   233          1189                +--------------------+----------+-----------------+--------+ Arterial anastomosis   271                              +--------------------+----------+-----------------+--------+ Prox graft             144                              +--------------------+----------+-----------------+--------+ Mid graft              155                              +--------------------+----------+-----------------+--------+ Distal graft           258                              +--------------------+----------+-----------------+--------+ Venous anastomosis     222                              +--------------------+----------+-----------------+--------+ Venous outflow         170                              +--------------------+----------+-----------------+--------+  Summary: Patent arteriovenous graft. *See table(s) above for measurements and observations.  Diagnosing physician: Harold Barban MD Electronically signed by Harold Barban MD on 11/07/2021 at 7:39:32 PM.    --------------------------------------------------------------------------------   Final    UE Venous Duplex ([facility] and [facility] ONLY)  Result Date: 11/07/2021 UPPER VENOUS STUDY  Patient Name:  MICHIAH MUDRY  Date of Exam:   11/07/2021 Medical Rec #: [id number]    Accession #:    [id number] Date of Birth: 01/24/1961    Patient Gender: M Patient Age:   28 years Exam Location:  [facility] Procedure:      VAS [place] UPPER EXTREMITY VENOUS DUPLEX Referring Phys: Ezequiel Essex --------------------------------------------------------------------------------  Indications: Edema Limitations: Body habitus, poor ultrasound/tissue interface and  patient position, depth of vessels, presence of AVG. Comparison Study: No prior study Performing Technologist: Maudry Mayhew MHA, RDMS, RVT, RDCS  Examination Guidelines: A complete evaluation includes B-mode imaging, spectral Doppler, color Doppler, and power Doppler as needed of all accessible portions of each vessel. Bilateral testing is considered an integral part of a complete examination. Limited examinations for reoccurring indications may be performed as noted.  Right Findings: +----------+------------+---------+-----------+----------+--------------+ RIGHT     CompressiblePhasicitySpontaneousProperties   Summary     +----------+------------+---------+-----------+----------+--------------+ IJV                                                 Not visualized +----------+------------+---------+-----------+----------+--------------+ Subclavian    Full       Yes       Yes      patent                 +----------+------------+---------+-----------+----------+--------------+ Axillary  Yes       Yes      patent                 +----------+------------+---------+-----------+----------+--------------+ Brachial                 Yes       Yes      patent                 +----------+------------+---------+-----------+----------+--------------+ Radial        Full                 Yes      patent                 +----------+------------+---------+-----------+----------+--------------+ Ulnar         Full                 Yes      patent                 +----------+------------+---------+-----------+----------+--------------+ Cephalic                                            Not visualized +----------+------------+---------+-----------+----------+--------------+ Basilic                                             Not visualized +----------+------------+---------+-----------+----------+--------------+  Left Findings:  +----------+------------+---------+-----------+----------+--------------+ LEFT      CompressiblePhasicitySpontaneousProperties   Summary     +----------+------------+---------+-----------+----------+--------------+ Subclavian                                          Not visualized +----------+------------+---------+-----------+----------+--------------+  Summary:  Right: No obvious evidence of deep vein thrombosis involving visualized veins of the right upper extremity.  *See table(s) above for measurements and observations.  Diagnosing physician: Harold Barban MD Electronically signed by Harold Barban MD on 11/07/2021 at 7:42:32 PM.    Final     Scheduled Meds:  amLODipine  10 mg Oral QHS   apixaban  2.5 mg Oral BID   aspirin EC  81 mg Oral Daily   atorvastatin  40 mg Oral Daily   calcium acetate  1,334 mg Oral TID with meals   carvedilol  12.5 mg Oral BID WC   Chlorhexidine Gluconate Cloth  6 each Topical Q0600   Chlorhexidine Gluconate Cloth  6 each Topical Q0600   insulin aspart  0-6 Units Subcutaneous TID WC   insulin regular human CONCENTRATED  70 Units Subcutaneous Q supper   metolazone  10 mg Oral Daily   nortriptyline  20 mg Oral QHS   pantoprazole  20 mg Oral Daily   sertraline  100 mg Oral Daily   sodium chloride flush  3 mL Intravenous Q12H   tamsulosin  0.4 mg Oral Daily   torsemide  100 mg Oral Q T,Th,S,Su   Continuous Infusions:   LOS: 0 days   Time spent: 35 minutes   Darliss Cheney, MD [facility]  11/08/2021, 10:58 AM  Please page via Shea [facility] and do not message via secure chat for anything urgent. Secure chat can be used for anything non urgent.  How to contact the  [facility] Attending or Consulting provider [place] or covering provider during after hours [place], for this patient?  Check the care team in [facility] and look for a) attending/consulting [facility] provider listed and b) the [facility] team listed. Page or secure chat [phone]. Log into [email or web address] and use [facility]'s universal password to access. If you do not have the password, please contact the hospital operator. Locate the [facility] provider you are looking for under [facility] and page to a number that you can be directly reached. If you still have difficulty reaching the provider, please page the [facility] (Director on Call) for the Hospitalists listed on amion for assistance.

## 2021-11-08 NOTE — Interval H&P Note (Signed)
History and Physical Interval Note:  11/08/2021 1:49 PM  Jeffrey [name]  has presented today for surgery, with the diagnosis of instage renal.  The various methods of treatment have been discussed with the patient and family. After consideration of risks, benefits and other options for treatment, the patient has consented to  Procedure(s): A/V SHUNTOGRAM (N/A) as a surgical intervention.  The patient's history has been reviewed, patient examined, no change in status, stable for surgery.  I have reviewed the patient's chart and labs.  Questions were answered to the patient's satisfaction.     Servando Snare

## 2021-11-08 NOTE — Op Note (Signed)
    Patient name: Jeffrey [name] MRN: [id number] DOB: [birth year] Sex: male  11/08/2021 Pre-operative Diagnosis: End-stage renal disease, right upper extremity edema Post-operative diagnosis:  Same Surgeon:  Eda Paschal. Donzetta Matters, MD Procedure Performed: 1.  Ultrasound-guided cannulation right arm AV graft 2.  Right upper extremity fistulogram 3.  Stent of right innominate vein with 10 x 25 mm LD express balloon expandable  Indications: 60 year old male with history of end-stage renal disease currently dialyzing via left IJ catheter.  He has a right arm AV graft.  He is undergone balloon angioplasty of his right innominate vein in the past.  He is now indicated for repeat fistulogram with possible invention.  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  This was primarily stented with 10 mm balloon expandable stent.  At completion there is 0% residual stenosis and less filling of collaterals in the right upper extremity.   Procedure:  The patient was identified in the holding area and taken to room 8.  The patient was then placed supine on the table and prepped and draped in the usual sterile fashion.  A time out was called.  Ultrasound was used to evaluate the right arm AV graft.  This was noted be patent and compressible.  There is no signs 1% lidocaine cannulated microneedle followed by wire and sheath.  Images saved the permanent record.  We performed right upper extremity shuntogram.  With the above findings we placed a Rosen wire followed by short 7 [place] sheath.  We crossed the lesion placed a Rosen wire down into the IVC.  We then primarily stented with balloon expandable stent 10 x 25 mm.  While this was inflated we perform retrograde imaging in the arterial anastomosis appears patent without any disease in the graft.  Balloon was allowed down.  Completion angiography demonstrated no residual stenosis.  Satisfied with this we removed our wire.  We suture-ligated the  cannulation site.  He tolerated procedure well without any complication.  Contrast: 50cc  Lendon George C. Donzetta Matters, MD [facility] [place] Office: [phone] Pager: [phone]

## 2021-11-08 NOTE — Plan of Care (Signed)
  Problem: Health Behavior/Discharge Planning: Goal: Ability to manage health-related needs will improve Outcome: Progressing   Problem: Clinical Measurements: Goal: Complications related to the disease process or treatment will be avoided or minimized Outcome: Progressing Goal: Dialysis access will remain free of complications Outcome: Progressing   Problem: Activity: Goal: Activity intolerance will improve Outcome: Progressing   Problem: Fluid Volume: Goal: Fluid volume balance will be maintained or improved Outcome: Progressing   Problem: Nutritional: Goal: Ability to make appropriate dietary choices will improve Outcome: Progressing   Problem: Respiratory: Goal: Respiratory symptoms related to disease process will be avoided Outcome: Progressing   Problem: Self-Concept: Goal: Body image disturbance will be avoided or minimized Outcome: Progressing   Problem: Urinary Elimination: Goal: Progression of disease will be identified and treated Outcome: Progressing

## 2021-11-08 NOTE — H&P (View-Only) (Signed)
[facility]  Patient name: Jeffrey [name] MRN: [id number] DOB: 02-27-1961 Sex: male   REQUESTING PROVIDER:   Hospital service   REASON FOR CONSULT:    Right arm swelling  HISTORY OF PRESENT ILLNESS:   Jeffrey [name] is a 60 y.o. male, who is status post right upper extremity graft for dialysis in August 2022.  He has a history of a left arm fistula that was ligated for steal.  He had previously had a right axillary loop graft at [facility].  Prior to his graft placement, he underwent a venogram that did show some narrowing from his catheter but otherwise the central venous system was patent.  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  This was dilated with balloon venoplasty with approximate 20% residual stenosis.  He did not get significant relief from this however his arm has continued to become more edematous.  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  He is on dialysis Monday Wednesday Friday.  He takes a statin for hypercholesterolemia.  PAST MEDICAL HISTORY    Past Medical History:  Diagnosis Date   Arthritis    Blood transfusion without reported diagnosis    CHF (congestive heart failure) (HCC)    COPD (chronic obstructive pulmonary disease) ([place])    Diabetes mellitus without complication (HCC)    Hypertension    PTSD (post-traumatic stress disorder)    Renal disorder    Stage 5 Kidney Failure     FAMILY HISTORY   No family history on file.  SOCIAL HISTORY:   Social History   Socioeconomic History   Marital status: Married    Spouse name: Not on file   Number of children: Not on file   Years of education: Not on file   Highest education level: Not on file  Occupational History   Occupation: disabled  Tobacco Use   Smoking status: Former     Types: Cigarettes    Quit date: 2012    Years since quitting: 10.9   Smokeless tobacco: Never  Vaping Use   Vaping Use: Never used  Substance and Sexual Activity   Alcohol use: Yes    Comment: occasionally   Drug use: Not Currently    Comment: Years ago   Sexual activity: Not on file  Other Topics Concern   Not on file  Social History Narrative   Not on file   Social Determinants of Health   Financial Resource Strain: Not on file  Food Insecurity: Not on file  Transportation Needs: Not on file  Physical Activity: Not on file  Stress: Not on file  Social Connections: Not on file  Intimate Partner Violence: Not on file    ALLERGIES:    Allergies  Allergen Reactions   Bupropion Swelling   Dexmedetomidine Nausea And Vomiting    Other reaction(s): Other (See Comments) Dose-limiting bradycardia Dose-limiting bradycardia    Ibuprofen Shortness Of Breath and Swelling    Pt tolerates aspirin Pt tolerates aspirin    Pioglitazone Anaphylaxis and Rash   Tomato Anaphylaxis    Only allergic to RAW tomatoes    CURRENT MEDICATIONS:    Current Facility-Administered Medications  Medication Dose Route Frequency Provider Last Rate Last Admin   acetaminophen (TYLENOL) tablet 650 mg  650 mg Oral Q6H PRN Norval Morton, MD       Or  acetaminophen (TYLENOL) suppository 650 mg  650 mg Rectal Q6H PRN Fuller Plan A, MD       albuterol (PROVENTIL) (2.5 MG/3ML) 0.083% nebulizer solution 2.5 mg  2.5 mg Nebulization Q6H PRN Tamala Julian, Rondell A, MD       amLODipine (NORVASC) tablet 10 mg  10 mg Oral QHS Smith, Rondell A, MD   10 mg at 11/07/21 2139   apixaban (ELIQUIS) tablet 2.5 mg  2.5 mg Oral BID Fuller Plan A, MD   2.5 mg at 11/07/21 2138   aspirin EC tablet 81 mg  81 mg Oral Daily Smith, Rondell A, MD       atorvastatin (LIPITOR) tablet 40 mg  40 mg Oral Daily Tamala Julian, Rondell A, MD   40 mg at 11/07/21 1253   calcium acetate (PHOSLO) capsule 1,334 mg  1,334 mg Oral TID with  meals Fuller Plan A, MD   1,334 mg at 11/07/21 1723   carvedilol (COREG) tablet 12.5 mg  12.5 mg Oral BID WC Smith, Rondell A, MD   12.5 mg at 11/07/21 1723   cetaphil lotion   Topical Daily PRN Norval Morton, MD       Chlorhexidine Gluconate Cloth 2 % PADS 6 each  6 each Topical Q0600 Penninger, Ria Comment, PA   6 each at 11/07/21 1822   Chlorhexidine Gluconate Cloth 2 % PADS 6 each  6 each Topical Q0600 Penninger, Ria Comment, PA   6 each at 11/08/21 0617   dextrose 50 % solution 50 mL  1 ampule Intravenous PRN Fuller Plan A, MD       glucagon (human recombinant) (GLUCAGEN) injection 1 mg  1 mg Intramuscular PRN Fuller Plan A, MD       insulin aspart (novoLOG) injection 0-6 Units  0-6 Units Subcutaneous TID WC Fuller Plan A, MD   2 Units at 11/08/21 0623   insulin regular human CONCENTRATED (HUMULIN R) 500 UNIT/ML KwikPen 125 Units  125 Units Subcutaneous Q supper Fuller Plan A, MD   125 Units at 11/07/21 2248   [START ON 11/09/2021] insulin regular human CONCENTRATED (HUMULIN R) 500 UNIT/ML KwikPen 75 Units  75 Units Subcutaneous Q breakfast Smith, Rondell A, MD       metolazone (ZAROXOLYN) tablet 10 mg  10 mg Oral Daily Smith, Rondell A, MD   10 mg at 11/07/21 1255   nortriptyline (PAMELOR) capsule 20 mg  20 mg Oral QHS Smith, Rondell A, MD   20 mg at 11/07/21 2139   ondansetron (ZOFRAN) tablet 4 mg  4 mg Oral Q6H PRN Fuller Plan A, MD       Or   ondansetron (ZOFRAN) injection 4 mg  4 mg Intravenous Q6H PRN Fuller Plan A, MD       oxyCODONE-acetaminophen (PERCOCET/ROXICET) 5-325 MG per tablet 1 tablet  1 tablet Oral Q6H PRN Fuller Plan A, MD   1 tablet at 11/08/21 0330   pantoprazole (PROTONIX) EC tablet 20 mg  20 mg Oral Daily Smith, Rondell A, MD   20 mg at 11/07/21 1253   polyethylene glycol (MIRALAX / GLYCOLAX) packet 17 g  17 g Oral Daily PRN Fuller Plan A, MD       senna-docusate (Senokot-S) tablet 2 tablet  2 tablet Oral BID PRN Fuller Plan A, MD        sertraline (ZOLOFT) tablet 100 mg  100 mg Oral Daily Smith, Rondell A, MD   100 mg at 11/07/21 1253   sodium chloride flush (NS) 0.9 % injection 3  mL  3 mL Intravenous Q12H Fuller Plan A, MD   3 mL at 11/07/21 2140   tamsulosin (FLOMAX) capsule 0.4 mg  0.4 mg Oral Daily Fuller Plan A, MD   0.4 mg at 11/07/21 1253   torsemide (DEMADEX) tablet 100 mg  100 mg Oral Q T,Th,S,Su Smith, Rondell A, MD   100 mg at 11/07/21 1253    REVIEW OF SYSTEMS:   [X]  denotes positive finding, [ ]  denotes negative finding Cardiac  Comments:  Chest pain or chest pressure:    Shortness of breath upon exertion:    Short of breath when lying flat:    Irregular heart rhythm:        Vascular    Pain in calf, thigh, or hip brought on by ambulation:    Pain in feet at night that wakes you up from your sleep:     Blood clot in your veins:    Leg swelling:         Pulmonary    Oxygen at home:    Productive cough:     Wheezing:         Neurologic    Sudden weakness in arms or legs:     Sudden numbness in arms or legs:     Sudden onset of difficulty speaking or slurred speech:    Temporary loss of vision in one eye:     Problems with dizziness:         Gastrointestinal    Blood in stool:      Vomited blood:         Genitourinary    Burning when urinating:     Blood in urine:        Psychiatric    Major depression:         Hematologic    Bleeding problems:    Problems with blood clotting too easily:        Skin    Rashes or ulcers:        Constitutional    Fever or chills:     PHYSICAL EXAM:   Vitals:   11/07/21 2200 11/08/21 0022 11/08/21 0220 11/08/21 0513  BP:  (!) 144/55  129/67  Pulse:  90  81  Resp:  16  14  Temp:  98.7 F (37.1 C)  (!) 97.4 F (36.3 C)  TempSrc:  Oral  Oral  SpO2: 95% 91%  92%  Weight:   129.4 kg   Height:        GENERAL: The patient is a well-nourished male, in no acute distress. The vital signs are documented above. CARDIAC: There is a regular rate  and rhythm.  VASCULAR: Right arm graft is patent but the arm is very swollen PULMONARY: Nonlabored respirations ABDOMEN: Soft and non-tender with normal pitched bowel sounds.  NEUROLOGIC: No focal weakness or paresthesias are detected. SKIN: There are no ulcers or rashes noted. PSYCHIATRIC: The patient has a normal affect.  STUDIES:    none  ASSESSMENT and PLAN   Right arm swelling following dialysis graft: The patient continues to have persistent right upper extremity swelling.  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  I discussed with him repeating his shuntogram today to see if we can improve the presumed recurrent stenosis.  If this does not alleviate his arm swelling, he will likely require ligation of the graft.  We will proceed with shuntogram today.   Leia Alf, MD, FACS [facility]  [facility] Tel [phone] Pager [phone]

## 2021-11-08 NOTE — Consult Note (Signed)
[facility]  Patient name: Jeffrey [name] States MRN: [id number] DOB: 1961/08/26 Sex: male   REQUESTING PROVIDER:   Hospital service   REASON FOR CONSULT:    Right arm swelling  HISTORY OF PRESENT ILLNESS:   Jeffrey [name] is a 60 y.o. male, who is status post right upper extremity graft for dialysis in August 2022.  He has a history of a left arm fistula that was ligated for steal.  He had previously had a right axillary loop graft at [facility].  Prior to his graft placement, he underwent a venogram that did show some narrowing from his catheter but otherwise the central venous system was patent.  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  This was dilated with balloon venoplasty with approximate 20% residual stenosis.  He did not get significant relief from this however his arm has continued to become more edematous.  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  He is on dialysis Monday Wednesday Friday.  He takes a statin for hypercholesterolemia.  PAST MEDICAL HISTORY    Past Medical History:  Diagnosis Date   Arthritis    Blood transfusion without reported diagnosis    CHF (congestive heart failure) (HCC)    COPD (chronic obstructive pulmonary disease) ([place])    Diabetes mellitus without complication (HCC)    Hypertension    PTSD (post-traumatic stress disorder)    Renal disorder    Stage 5 Kidney Failure     FAMILY HISTORY   No family history on file.  SOCIAL HISTORY:   Social History   Socioeconomic History   Marital status: Married    Spouse name: Not on file   Number of children: Not on file   Years of education: Not on file   Highest education level: Not on file  Occupational History   Occupation: disabled  Tobacco Use   Smoking status: Former     Types: Cigarettes    Quit date: 2012    Years since quitting: 10.9   Smokeless tobacco: Never  Vaping Use   Vaping Use: Never used  Substance and Sexual Activity   Alcohol use: Yes    Comment: occasionally   Drug use: Not Currently    Comment: Years ago   Sexual activity: Not on file  Other Topics Concern   Not on file  Social History Narrative   Not on file   Social Determinants of Health   Financial Resource Strain: Not on file  Food Insecurity: Not on file  Transportation Needs: Not on file  Physical Activity: Not on file  Stress: Not on file  Social Connections: Not on file  Intimate Partner Violence: Not on file    ALLERGIES:    Allergies  Allergen Reactions   Bupropion Swelling   Dexmedetomidine Nausea And Vomiting    Other reaction(s): Other (See Comments) Dose-limiting bradycardia Dose-limiting bradycardia    Ibuprofen Shortness Of Breath and Swelling    Pt tolerates aspirin Pt tolerates aspirin    Pioglitazone Anaphylaxis and Rash   Tomato Anaphylaxis    Only allergic to RAW tomatoes    CURRENT MEDICATIONS:    Current Facility-Administered Medications  Medication Dose Route Frequency Provider Last Rate Last Admin   acetaminophen (TYLENOL) tablet 650 mg  650 mg Oral Q6H PRN Norval Morton, MD       Or  acetaminophen (TYLENOL) suppository 650 mg  650 mg Rectal Q6H PRN Fuller Plan A, MD       albuterol (PROVENTIL) (2.5 MG/3ML) 0.083% nebulizer solution 2.5 mg  2.5 mg Nebulization Q6H PRN Tamala Julian, Rondell A, MD       amLODipine (NORVASC) tablet 10 mg  10 mg Oral QHS Smith, Rondell A, MD   10 mg at 11/07/21 2139   apixaban (ELIQUIS) tablet 2.5 mg  2.5 mg Oral BID Fuller Plan A, MD   2.5 mg at 11/07/21 2138   aspirin EC tablet 81 mg  81 mg Oral Daily Smith, Rondell A, MD       atorvastatin (LIPITOR) tablet 40 mg  40 mg Oral Daily Tamala Julian, Rondell A, MD   40 mg at 11/07/21 1253   calcium acetate (PHOSLO) capsule 1,334 mg  1,334 mg Oral TID with  meals Fuller Plan A, MD   1,334 mg at 11/07/21 1723   carvedilol (COREG) tablet 12.5 mg  12.5 mg Oral BID WC Smith, Rondell A, MD   12.5 mg at 11/07/21 1723   cetaphil lotion   Topical Daily PRN Norval Morton, MD       Chlorhexidine Gluconate Cloth 2 % PADS 6 each  6 each Topical Q0600 Penninger, Ria Comment, PA   6 each at 11/07/21 1822   Chlorhexidine Gluconate Cloth 2 % PADS 6 each  6 each Topical Q0600 Penninger, Ria Comment, PA   6 each at 11/08/21 0617   dextrose 50 % solution 50 mL  1 ampule Intravenous PRN Fuller Plan A, MD       glucagon (human recombinant) (GLUCAGEN) injection 1 mg  1 mg Intramuscular PRN Fuller Plan A, MD       insulin aspart (novoLOG) injection 0-6 Units  0-6 Units Subcutaneous TID WC Fuller Plan A, MD   2 Units at 11/08/21 0623   insulin regular human CONCENTRATED (HUMULIN R) 500 UNIT/ML KwikPen 125 Units  125 Units Subcutaneous Q supper Fuller Plan A, MD   125 Units at 11/07/21 2248   [START ON 11/09/2021] insulin regular human CONCENTRATED (HUMULIN R) 500 UNIT/ML KwikPen 75 Units  75 Units Subcutaneous Q breakfast Smith, Rondell A, MD       metolazone (ZAROXOLYN) tablet 10 mg  10 mg Oral Daily Smith, Rondell A, MD   10 mg at 11/07/21 1255   nortriptyline (PAMELOR) capsule 20 mg  20 mg Oral QHS Smith, Rondell A, MD   20 mg at 11/07/21 2139   ondansetron (ZOFRAN) tablet 4 mg  4 mg Oral Q6H PRN Fuller Plan A, MD       Or   ondansetron (ZOFRAN) injection 4 mg  4 mg Intravenous Q6H PRN Fuller Plan A, MD       oxyCODONE-acetaminophen (PERCOCET/ROXICET) 5-325 MG per tablet 1 tablet  1 tablet Oral Q6H PRN Fuller Plan A, MD   1 tablet at 11/08/21 0330   pantoprazole (PROTONIX) EC tablet 20 mg  20 mg Oral Daily Smith, Rondell A, MD   20 mg at 11/07/21 1253   polyethylene glycol (MIRALAX / GLYCOLAX) packet 17 g  17 g Oral Daily PRN Fuller Plan A, MD       senna-docusate (Senokot-S) tablet 2 tablet  2 tablet Oral BID PRN Fuller Plan A, MD        sertraline (ZOLOFT) tablet 100 mg  100 mg Oral Daily Smith, Rondell A, MD   100 mg at 11/07/21 1253   sodium chloride flush (NS) 0.9 % injection 3  mL  3 mL Intravenous Q12H Fuller Plan A, MD   3 mL at 11/07/21 2140   tamsulosin (FLOMAX) capsule 0.4 mg  0.4 mg Oral Daily Fuller Plan A, MD   0.4 mg at 11/07/21 1253   torsemide (DEMADEX) tablet 100 mg  100 mg Oral Q T,Th,S,Su Smith, Rondell A, MD   100 mg at 11/07/21 1253    REVIEW OF SYSTEMS:   [X]  denotes positive finding, [ ]  denotes negative finding Cardiac  Comments:  Chest pain or chest pressure:    Shortness of breath upon exertion:    Short of breath when lying flat:    Irregular heart rhythm:        Vascular    Pain in calf, thigh, or hip brought on by ambulation:    Pain in feet at night that wakes you up from your sleep:     Blood clot in your veins:    Leg swelling:         Pulmonary    Oxygen at home:    Productive cough:     Wheezing:         Neurologic    Sudden weakness in arms or legs:     Sudden numbness in arms or legs:     Sudden onset of difficulty speaking or slurred speech:    Temporary loss of vision in one eye:     Problems with dizziness:         Gastrointestinal    Blood in stool:      Vomited blood:         Genitourinary    Burning when urinating:     Blood in urine:        Psychiatric    Major depression:         Hematologic    Bleeding problems:    Problems with blood clotting too easily:        Skin    Rashes or ulcers:        Constitutional    Fever or chills:     PHYSICAL EXAM:   Vitals:   11/07/21 2200 11/08/21 0022 11/08/21 0220 11/08/21 0513  BP:  (!) 144/55  129/67  Pulse:  90  81  Resp:  16  14  Temp:  98.7 F (37.1 C)  (!) 97.4 F (36.3 C)  TempSrc:  Oral  Oral  SpO2: 95% 91%  92%  Weight:   129.4 kg   Height:        GENERAL: The patient is a well-nourished male, in no acute distress. The vital signs are documented above. CARDIAC: There is a regular rate  and rhythm.  VASCULAR: Right arm graft is patent but the arm is very swollen PULMONARY: Nonlabored respirations ABDOMEN: Soft and non-tender with normal pitched bowel sounds.  NEUROLOGIC: No focal weakness or paresthesias are detected. SKIN: There are no ulcers or rashes noted. PSYCHIATRIC: The patient has a normal affect.  STUDIES:    none  ASSESSMENT and PLAN   Right arm swelling following dialysis graft: The patient continues to have persistent right upper extremity swelling.  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  I discussed with him repeating his shuntogram today to see if we can improve the presumed recurrent stenosis.  If this does not alleviate his arm swelling, he will likely require ligation of the graft.  We will proceed with shuntogram today.   Leia Alf, MD, FACS [facility]  [facility] Tel [phone] Pager [phone]

## 2021-11-08 NOTE — Progress Notes (Signed)
Inpatient Diabetes Program Recommendations  AACE/ADA: New Consensus Statement on Inpatient Glycemic Control (2015)  Target Ranges:  Prepandial:   less than 140 mg/dL      Peak postprandial:   less than 180 mg/dL (1-2 hours)      Critically ill patients:  140 - 180 mg/dL   Lab Results  Component Value Date   GLUCAP 249 (H) 11/08/2021   HGBA1C 7.1 (H) 11/08/2021    Review of Glycemic Control  Diabetes history: DM2 Outpatient Diabetes medications: Dialysis days: 75 units every morning and 125 units nightly   nondialysis days 150 units every morning and 135 units nightly Current orders for Inpatient glycemic control: U 500 75 units ac breakfast, 125 ac supper  Inpatient Diabetes Program Recommendations:   Noted patient NPO for procedure. Sent [place] to Dr. Doristine Bosworth recommending holding am dose of U 500 while pt. Is NPO and sent secure chat to RN [employer].  Thank you, Nani Gasser. Seham Gardenhire, RN, MSN, CDE  Diabetes Coordinator Inpatient Glycemic Control Team Team Pager [phone] (8am-5pm) 11/08/2021 9:20 AM

## 2021-11-09 ENCOUNTER — Observation Stay (HOSPITAL_COMMUNITY): Payer: Medicare Other

## 2021-11-09 ENCOUNTER — Encounter (HOSPITAL_COMMUNITY): Payer: Self-pay | Admitting: Vascular Surgery

## 2021-11-09 DIAGNOSIS — T82858A Stenosis of vascular prosthetic devices, implants and grafts, initial encounter: Secondary | ICD-10-CM

## 2021-11-09 DIAGNOSIS — Z95828 Presence of other vascular implants and grafts: Secondary | ICD-10-CM

## 2021-11-09 DIAGNOSIS — N186 End stage renal disease: Secondary | ICD-10-CM

## 2021-11-09 DIAGNOSIS — Z992 Dependence on renal dialysis: Secondary | ICD-10-CM

## 2021-11-09 DIAGNOSIS — R079 Chest pain, unspecified: Secondary | ICD-10-CM | POA: Diagnosis not present

## 2021-11-09 LAB — RENAL FUNCTION PANEL
Albumin: 3.3 g/dL — ABNORMAL LOW (ref 3.5–5.0)
Anion gap: 14 (ref 5–15)
BUN: 59 mg/dL — ABNORMAL HIGH (ref 8–23)
CO2: 24 mmol/L (ref 22–32)
Calcium: 8.9 mg/dL (ref 8.9–10.3)
Chloride: 93 mmol/L — ABNORMAL LOW (ref 98–111)
Creatinine, Ser: 4.18 mg/dL — ABNORMAL HIGH (ref 0.61–1.24)
GFR, Estimated: 15 mL/min — ABNORMAL LOW (ref >60)
Glucose, Bld: 486 mg/dL — ABNORMAL HIGH (ref 70–99)
Phosphorus: 4.8 mg/dL — ABNORMAL HIGH (ref 2.5–4.6)
Potassium: 4.1 mmol/L (ref 3.5–5.1)
Sodium: 131 mmol/L — ABNORMAL LOW (ref 135–145)

## 2021-11-09 LAB — CBC
HCT: 31.5 % — ABNORMAL LOW (ref 39.0–52.0)
Hemoglobin: 10.2 g/dL — ABNORMAL LOW (ref 13.0–17.0)
MCH: 27.7 pg (ref 26.0–34.0)
MCHC: 32.4 g/dL (ref 30.0–36.0)
MCV: 85.6 fL (ref 80.0–100.0)
Platelets: 116 10*3/uL — ABNORMAL LOW (ref 150–400)
RBC: 3.68 MIL/uL — ABNORMAL LOW (ref 4.22–5.81)
RDW: 16.8 % — ABNORMAL HIGH (ref 11.5–15.5)
WBC: 9.3 10*3/uL (ref 4.0–10.5)
nRBC: 0 % (ref 0.0–0.2)

## 2021-11-09 LAB — GLUCOSE, CAPILLARY
Glucose-Capillary: 361 mg/dL — ABNORMAL HIGH (ref 70–99)
Glucose-Capillary: 418 mg/dL — ABNORMAL HIGH (ref 70–99)
Glucose-Capillary: 458 mg/dL — ABNORMAL HIGH (ref 70–99)

## 2021-11-09 MED ORDER — DICLOFENAC SODIUM 1 % EX GEL
2.0000 g | Freq: Four times a day (QID) | CUTANEOUS | Status: DC
  Administered 2021-11-09: 2 g via TOPICAL
  Filled 2021-11-09: qty 100

## 2021-11-09 MED ORDER — TAMSULOSIN HCL 0.4 MG PO CAPS: 0.4000 mg | ORAL_CAPSULE | Freq: Every day | ORAL | 0 refills | Status: DC

## 2021-11-09 MED ORDER — INSULIN REGULAR HUMAN (CONC) 500 UNIT/ML ~~LOC~~ SOPN
75.0000 [IU] | PEN_INJECTOR | Freq: Every day | SUBCUTANEOUS | Status: DC
  Administered 2021-11-09: 75 [IU] via SUBCUTANEOUS

## 2021-11-09 MED FILL — Heparin Sod (Porcine)-NaCl IV Soln 1000 Unit/500ML-0.9%: INTRAVENOUS | Qty: 500 | Status: AC

## 2021-11-09 MED FILL — Lidocaine HCl Local Preservative Free (PF) Inj 1%: INTRAMUSCULAR | Qty: 30 | Status: AC

## 2021-11-09 NOTE — Discharge Summary (Addendum)
Physician Discharge Summary  Jeffrey [name] BWI:[id number] DOB: 1961-09-05 DOA: 11/06/2021  PCP: Pcp, No  Admit date: 11/06/2021 Discharge date: 11/09/2021    Admitted From: Home Disposition: Home  Recommendations for Outpatient Follow-up:  Follow up with PCP in 1-2 weeks Please obtain BMP/CBC in one week Low up with routine hemodialysis MWF Follow-up with vascular surgery in 1 to 2 weeks Please follow up with your PCP on the following pending results: Unresulted Labs (From admission, onward)     Start     Ordered   11/08/21 0500  Renal function panel  Daily,   R      11/07/21 0946              Home Health: Yes Equipment/Devices: None  Discharge Condition: Stable CODE STATUS: Full code Diet recommendation: Cardiac/renal  Subjective: Seen and examined.  His right hand pain and swelling has improved significantly since he had right upper extremity fistulogram with a stent of the right innominate vein yesterday.  Following HPI and ED course is copied from my colleague admitting hospitalist Dr. Thompson Caul H&P HPI: Jeffrey [name] is a 60 y.o. male with medical history significant of ESRD on HD(MWF), on chronic anticoagulation, hypertension, diabetes mellitus type 2, COPD, s/p left AKA presents with complaints of left-sided chest pain starting yesterday morning while he was fixing breakfast.  Pain radiated into his left arm causing it to feel numb.  Chest pain did not initially improve when he rested.  However, reported having intermittent pains throughout the rest of the day.  Last night while he was transferring from bed to his wheelchair notes that his right ankle gave out causing him to fall and land on his buttocks.  After he fell he reported that the chest pain reoccurred.  Patient had gone to hemodialysis last on 12/7, but reports that he was having severe cramps in his legs all the way into his groin and for which he could not tolerate full session.  Reports being up 9 L and had  missed hemodialysis on 12/9 due to recovering from the prior session.  Patient reports that he has been swelling all over from his ankles up into his right arm.  Right arm is 2-3 times the size of the left arm and he has decreased motion of it.  He is still using the dialysis catheter in the left chest wall because he does not regain function of his right arm since he had shoulder surgery in October.  Records note patient had been hospitalized in beginning of September and October and underwent balloon angioplasty with vascular surgery for the right upper extremity.  During this hospitalization he had been evaluated with possibility of clot but none was seen.  Lastly patient notes that he supposed be on CPAP at night, but cannot tolerate the device.   ED Course: Upon admission into the emergency department patient was noted to be afebrile, respiration 15-23, blood pressures elevated up to 180/122, and O2 saturation currently maintained on room air.  Labs significant for hemoglobin 9.5, potassium 3.1, CO2 33, BUN 70, creatinine 3.77, glucose as low as 69, BNP 77.9, and high-sensitivity troponin 18->18.  Urinalysis without signs of infection.  Chest x-ray noted cardiomegaly with vascular congestion and small bilateral pleural effusions.  X-rays of the right shoulder did not note any acute fracture, but did not old appearing fragment adjacent to the greater tuberosity of the humerus.  X-rays of the right ankle were noted to be similar to imaging performed in September.  He has been given 325 mg of aspirin.  Accepted to a medical telemetry bed as observation.  Brief/Interim Summary: Patient was hospitalized due to following issues  Chest pain  elevated troponin: Patient presented with left-sided chest pain with radiation down left arm.  High-sensitivity troponins essentially flat at 18.  EKG without significant ischemic changes chest x-ray noting cardiomegaly with vascular congestion and small bilateral pleural  effusions.  Likely demand ischemia.  Echo showed normal ejection fraction with no wall motion abnormality.  Chest pain resolved.   Fluid overload  ESRD on HD: Patient known for being noncompliant with his dialysis, often cuts his treatment in chart.  He did the same on 11/03/2021 and missed hemodialysis due to cramping on 12/9.  Noted to be acutely fluid overloaded vascular congestion and pleural effusions noted on chest x-ray.  Nephrology consulted, they started hemodialysis but patient developed cramps and as usual insisted the dialysis session to cut in short.  He had full session of dialysis yesterday and next is tomorrow.  Nephrology has cleared for discharge.   Hypokalemia  hypomagnesemia: Resolved.   Right arm swelling: Acute on chronic.  Patient reports worsening swelling of the right arm where he is recently had right shoulder surgery in October.  He has fistula on the right upper extremity that has not been used yet due to patient not having good function of his arm since his shoulder surgery.  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  During this hospitalization, he underwent right upper extremity fistulogram with a stent of the right innominate vein yesterday and his arm feels better.  He is cleared by vascular surgery for discharge.  Home health PT OT is arranged.   Hypertensive urgency: Acute. Blood pressures initially elevated up to 180/122.  Home blood pressure regimen includes amlodipine 10 mg nightly, Coreg 12.5 mg twice daily, torsemide 100 mg TTSS, and metolazone 10 mg daily.  All of them continued and his blood pressure is much better now.   Anemia chronic kidney disease: Hemoglobin is stable.   On chronic coagulation: We did extensive research with pharmacy and we could not find any reason for him to be on Eliquis chronically.  Patient also not sure.  We found out that this was prescribed to  him for DVT prophylaxis only for displaced fracture on 10/26/2021 for 14 days which she completed.  We are discontinuing Eliquis.   Fall  right ankle pain as well: Patient fell yesterday after right ankle gave way.  X-rays note similar widening of the lateral ankle mortise.  MRIs from September noted chronic appearing ligament tear.  He has been using the boot that was given to him during his previous hospitalization.  Seen by PT OT, home health PT OT recommended.   Diabetes mellitus type 2, uncontrolled  with alternating hyperglycemia and hypoglycemia: hemoglobin A1c 7.1.  Resume home medications/insulin.   Depression -Continue Zoloft and nortriptyline   Decreased urinary stream: Patient reports having difficulty urinating with decreased stream and discomfort.  Urinalysis without signs of infection.  Suspect possibly related with BPH.  Started trial of Flomax and is being discharged on that.  ADDENDUM: Patient was very argumentative this morning and did not want me to discharge him.  He started yelling at me.  I was able to calm him down.  He was talking to his wife before I entered and his wife was on the speaker phone during  the whole encounter.  He claimed that we are trying to kick him out without solving his issues.  I reiterated multiple times that we have taken care of his fistula which has no more stenosis, his right arm is much better which she agreed and vascular surgery has cleared him as well as nephrology since his next dialysis is due tomorrow.  Argument kept going on which I had to cut short.  We ended up discharging him.  Discussed in length with the nephrologist who knows him very well and according to him, patient has a longstanding history of noncompliance, he likes to miss his dialysis and then he likes to come to the hospital to seek TLC as well as IV pain medications and never wants to leave the hospital.  He becomes traumatic every time physicians try to discharge him.  I have  reviewed few of the notes verifying that information as well.  After hearing that he was being discharged, patient came up with the excuse that he has nobody to pick him up.  When he was informed that we will arrange ride for him with the help of TOC, patient then after few minutes called the nurse and saying that when he was trying to get out of the bed, he twisted his knee and heard a pop.  He was assessed by therapist, there was nothing found on his knee which was abnormal.  He demanded that a physician sees him.  I went to bedside and assessed his knee myself.  His knee was totally normal on examination.  He was intentionally tightening his knee, not letting me do passive range of motion.  Continued to be traumatic and crying with pain.  He cursed at me.  Before I went in the room, charge nurse also spoke to him and he was very rude with her and cursed at her as well.  While I was still in the room, he said "how can you say my knee has no issue, are you an orthopedist".  I told him that I do not think there is anything wrong with his knee and confronted him that he is coming up with excuses so his discharge can be held but to make sure, I am going to proceed with x-ray of the right knee stat and we will give him pain medication as well as Voltaren gel.  All orders are in place.  Nurse aware.  If x-ray is negative for any fracture, patient will be discharged home.  He then threatened to call his lawyer, he called his wife in front of me.  He continued to curse, I had to leave the room.  Discharge Diagnoses:  Principal Problem:   Chest pain Active Problems:   ESRD (end stage renal disease) ([place])   Type 2 diabetes mellitus with hyperlipidemia (HCC)   PTSD (post-traumatic stress disorder)   CHF (congestive heart failure) (HCC)   Anemia of chronic disease   Hypoglycemia   Hypertensive urgency   Swelling of right upper extremity    Discharge Instructions   Allergies as of 11/09/2021        Reactions   Bupropion Swelling   Dexmedetomidine Nausea And Vomiting   Other reaction(s): Other (See Comments) Dose-limiting bradycardia Dose-limiting bradycardia   Ibuprofen Shortness Of Breath, Swelling   Pt tolerates aspirin Pt tolerates aspirin   Pioglitazone Anaphylaxis, Rash   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   Accu-Chek Guide test strip Generic drug: glucose blood 4 (four) times daily.   acetaminophen 325 MG tablet Commonly known as: TYLENOL Take 2 tablets (650 mg total) by mouth every 6 (six) hours as needed for moderate pain.   albuterol (2.5 MG/3ML) 0.083% nebulizer solution Commonly known as: PROVENTIL Inhale 2.5 mg into the lungs every 8 (eight) hours as needed for wheezing or shortness of breath.   albuterol 108 (90 Base) MCG/ACT inhaler Commonly known as: VENTOLIN HFA Inhale 1-2 puffs into the lungs every 6 (six) hours as needed for wheezing or shortness of breath.   amLODipine 10 MG tablet Commonly known as: NORVASC Take 10 mg by mouth at bedtime.   Aspirin Low Dose 81 MG EC tablet Generic drug: aspirin Take 81 mg by mouth daily.   atorvastatin 40 MG tablet Commonly known as: LIPITOR Take 40 mg by mouth daily.   calcium acetate 667 MG capsule Commonly known as: PHOSLO Take 2 capsules (1,334 mg total) by mouth with breakfast, with lunch, and with evening meal.   carvedilol 12.5 MG tablet Commonly known as: COREG Take 12.5 mg by mouth 2 (two) times daily with a meal.   Cholecalciferol 50 MCG (2000 UT) Caps Take 2,000 Units by mouth daily.   Dexcom G6 Receiver Devi Use 1 Device continuously   diphenhydrAMINE 25 MG tablet Commonly known as: BENADRYL Take 50 mg by mouth  every 6 (six) hours as needed for allergies.   glucagon 1 MG injection Inject 1 mg into the muscle as needed (low blood sugar).   HumuLIN R U-500 KwikPen 500 UNIT/ML KwikPen Generic drug: insulin regular human CONCENTRATED Inject 80-140 Units into the skin 2 (two) times daily with a meal. For dialysis days give 80 units in am, if tray comes early to go to dialysis early may given medication 1-2 hours earlier, page MD for questions - If BG < 70, treat per hypoglycemia protocol. - If BG < 70 or NPO or any other dosing concerns, page MD. Note this insulin is 5 times as concentrated as regular insulin U-100. Verify dose and volume prior to administration. 0   CONCENTRATED insulin REGULAR (HUMULIN R U-500 "CONCENTRATED") 500 unit/mL injection 140 Units, Subcutaneous, User specified (Once per day on Mon Wed Fri Sun).  Instructions:   Non-Dialysis days give 140 units in the am - If NPO, hold dose. - If BG < 70, treat per hypoglycemia protocol. - If BG < 70 or any other dosing concerns, page MD. Note this insulin is 5 times as concentrated as regular insulin U-100. Verify dose and volume prior to administration. 0 What changed: Another medication with the same name was changed. Make sure you understand how and when to take each.   HumuLIN R U-500 KwikPen 500 UNIT/ML KwikPen Generic drug: insulin regular human CONCENTRATED Inject 45 Units into the skin daily with supper. What changed: Another medication with the same name was changed. Make sure you understand how and when to take each.   HumuLIN R U-500 KwikPen 500 UNIT/ML KwikPen Generic drug: insulin regular human CONCENTRATED Inject 60 Units into the skin daily with breakfast. What changed:  how much to take when to take this additional instructions   lidocaine 5 % Commonly known  as: LIDODERM Place 3 patches onto the skin daily. Remove & Discard patch within 12 hours or as directed by MD   metolazone 10 MG tablet Commonly known as:  ZAROXOLYN Take 10 mg by mouth daily.   nortriptyline 10 MG capsule Commonly known as: PAMELOR Take 20 mg by mouth at bedtime.   Ointment Base Oint Apply 1 application topically daily as needed (dry skin).   oxyCODONE-acetaminophen 5-325 MG tablet Commonly known as: PERCOCET/ROXICET Take 1 tablet by mouth every 6 (six) hours as needed for moderate pain or severe pain.   pantoprazole 20 MG tablet Commonly known as: PROTONIX Take 20 mg by mouth daily.   polyethylene glycol powder 17 GM/SCOOP powder Commonly known as: GLYCOLAX/MIRALAX Take 17 g by mouth daily as needed for mild constipation.   pregabalin 25 MG capsule Commonly known as: LYRICA Take 1 capsule (25 mg total) by mouth 2 (two) times daily.   senna-docusate 8.6-50 MG tablet Commonly known as: Senokot-S Take 2 tablets by mouth 2 (two) times daily as needed for mild constipation.   sertraline 100 MG tablet Commonly known as: ZOLOFT Take 100 mg by mouth daily.   tamsulosin 0.4 MG Caps capsule Commonly known as: FLOMAX Take 1 capsule (0.4 mg total) by mouth daily. Start taking on: November 10, 2021   torsemide 100 MG tablet Commonly known as: DEMADEX Take 1 tablet (100 mg total) by mouth every Tuesday, Thursday, Saturday, and Sunday.        Follow-up Information     [facility], [facility] Follow up.   Specialty: Home Health Services Why: [place], [place], [place], Social Worker Contact information: [place] [address] [phone]         [facility] Follow up in 3 week(s).   Specialty: Vascular Surgery Why: Office will call you to arrange your appt (sent) Contact information: [place] [address] [phone]               Allergies  Allergen Reactions   Bupropion Swelling   Dexmedetomidine Nausea And Vomiting    Other reaction(s): Other (See Comments) Dose-limiting bradycardia Dose-limiting  bradycardia    Ibuprofen Shortness Of Breath and Swelling    Pt tolerates aspirin Pt tolerates aspirin    Pioglitazone Anaphylaxis and Rash   Tomato Anaphylaxis    Only allergic to RAW tomatoes    Consultations: Vascular surgery and nephrology   Procedures/Studies: DG Chest 2 View  Result Date: 11/07/2021 CLINICAL DATA:  Chest pain and shortness of breath. EXAM: CHEST - 2 VIEW COMPARISON:  Chest radiograph report 09/03/2021. FINDINGS: Dialysis catheter in similar position. Cardiomegaly with vascular congestion. No focal consolidation pneumothorax. Small bilateral pleural effusions. No acute osseous pathology. Partially visualized right shoulder arthroplasty. IMPRESSION: Cardiomegaly with vascular congestion and small bilateral pleural effusions. Electronically Signed   By: Anner Crete M.D.   On: 11/07/2021 01:47   DG Shoulder Right  Result Date: 11/07/2021 CLINICAL DATA:  Right shoulder pain. EXAM: RIGHT SHOULDER - 2+ VIEW COMPARISON:  Chest radiograph dated 11/07/2021 FINDINGS: Evaluation is limited due to body habitus and soft tissue attenuation. There is a total right shoulder arthroplasty. The arthroplasty components appear intact and in anatomic alignment. No acute fracture or dislocation. Chronic appearing bone fragment adjacent to the greater tuberosity of the humerus. IMPRESSION: No the acute fracture or dislocation. Old appearing fragment adjacent to the greater tuberosity of the humerus. Right shoulder arthroplasty appears intact. Electronically Signed   By:  Anner Crete M.D.   On: 11/07/2021 01:48   DG Ankle Complete Right  Result Date: 11/07/2021 CLINICAL DATA:  Initial evaluation for acute trauma, fall, recent ankle injury. EXAM: RIGHT ANKLE - COMPLETE 3+ VIEW COMPARISON:  Prior radiograph from 08/03/2021. FINDINGS: Right ankle is imaged in the boot. No acute fracture or dislocation. Persistent subtle asymmetric widening of the lateral ankle mortise, similar to  perhaps slightly less pronounced as compared to prior. Scattered osteoarthritic changes noted about the ankle as well as the mid and hindfoot. Plantar calcaneal enthesophyte. Pes planus deformity noted. No visible soft tissue injury. IMPRESSION: 1. Persistent subtle asymmetric widening of the lateral ankle mortise, similar to perhaps slightly less pronounced as compared to prior radiograph from 08/03/2021. 2. No other acute or new osseous abnormality. Electronically Signed   By: Jeannine Boga M.D.   On: 11/07/2021 01:52   PERIPHERAL VASCULAR CATHETERIZATION  Result Date: 11/08/2021 Patient name: Terri Malerba MRN: [id number] DOB: 10/12/1961 Sex: male 11/08/2021 Pre-operative Diagnosis: End-stage renal disease, right upper extremity edema Post-operative diagnosis:  Same Surgeon:  Eda Paschal. Donzetta Matters, MD Procedure Performed: 1.  Ultrasound-guided cannulation right arm AV graft 2.  Right upper extremity fistulogram 3.  Stent of right innominate vein with 10 x 25 mm LD express balloon expandable Indications: 60 year old male with history of end-stage renal disease currently dialyzing via left IJ catheter.  He has a right arm AV graft.  He is undergone balloon angioplasty of his right innominate vein in the past.  He is now indicated for repeat fistulogram with possible invention. 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  This was primarily stented with 10 mm balloon expandable stent.  At completion there is 0% residual stenosis and less filling of collaterals in the right upper extremity.  Procedure:  The patient was identified in the holding area and taken to room 8.  The patient was then placed supine on the table and prepped and draped in the usual sterile fashion.  A time out was called.  Ultrasound was used to evaluate the right arm AV graft.  This was noted be patent and compressible.  There is no signs 1% lidocaine cannulated microneedle followed by wire and sheath.   Images saved the permanent record.  We performed right upper extremity shuntogram.  With the above findings we placed a Rosen wire followed by short 7 [place] sheath.  We crossed the lesion placed a Rosen wire down into the IVC.  We then primarily stented with balloon expandable stent 10 x 25 mm.  While this was inflated we perform retrograde imaging in the arterial anastomosis appears patent without any disease in the graft.  Balloon was allowed down.  Completion angiography demonstrated no residual stenosis.  Satisfied with this we removed our wire.  We suture-ligated the cannulation site.  He tolerated procedure well without any complication. Contrast: 50cc Brandon C. Donzetta Matters, MD [facility] Office: [phone] Pager: [phone]   VAS US DUPLEX DIALYSIS ACCESS (AVF, AVG)  Result Date: 11/07/2021 DIALYSIS ACCESS Patient Name:  MACLAIN COHRON  Date of Exam:   11/07/2021 Medical Rec #: [id number]    Accession #:    [id number] Date of Birth: 05-23-1961    Patient Gender: M Patient Age:   46 years Exam Location:  [facility] Procedure:      VAS US DUPLEX DIALYSIS ACCESS (AVF, AVG) Referring Phys: Ezequiel Essex --------------------------------------------------------------------------------  Reason for Exam: Right upper extremity edema. Access  Site: Right Upper Extremity. Access Type: Upper arm loop AVG. Limitations: Patient body habitus, tissue properties, depth of vessels/graft,              patient position/restricted mobility Comparison Study: 08/30/2021 AVG duplex Performing Technologist: Maudry Mayhew MHA, RDMS, RVT, RDCS  Examination Guidelines: A complete evaluation includes B-mode imaging, spectral Doppler, color Doppler, and power Doppler as needed of all accessible portions of each vessel. Unilateral testing is considered an integral part of a complete examination. Limited examinations for reoccurring indications may be performed as noted.  Findings:    +--------------------+----------+-----------------+--------+  AVG                  PSV (cm/s) Flow Vol (mL/min) Describe  +--------------------+----------+-----------------+--------+  Native artery inflow    233           1189                  +--------------------+----------+-----------------+--------+  Arterial anastomosis    271                                 +--------------------+----------+-----------------+--------+  Prox graft              144                                 +--------------------+----------+-----------------+--------+  Mid graft               155                                 +--------------------+----------+-----------------+--------+  Distal graft            258                                 +--------------------+----------+-----------------+--------+  Venous anastomosis      222                                 +--------------------+----------+-----------------+--------+  Venous outflow          170                                 +--------------------+----------+-----------------+--------+  Summary: Patent arteriovenous graft. *See table(s) above for measurements and observations.  Diagnosing physician: Harold Barban MD Electronically signed by Harold Barban MD on 11/07/2021 at 7:39:32 PM.    --------------------------------------------------------------------------------   Final    ECHOCARDIOGRAM COMPLETE  Result Date: 11/08/2021    ECHOCARDIOGRAM REPORT   Patient Name:   CEFERINO LANG Date of Exam: 11/08/2021 Medical Rec #:  [id number]   Height:       65.0 in Accession #:    [id number]  Weight:       285.3 lb Date of Birth:  02/26/1961   BSA:          2.300 m Patient Age:    7 years    BP:           126/46 mmHg Patient Gender: M           HR:  95 bpm. Exam Location:  Inpatient Procedure: 2D Echo, Cardiac Doppler, Color Doppler and Intracardiac            Opacification Agent Indications:    Abnormal ECG R94.31  History:        Patient has prior history of Echocardiogram  examinations, most                 recent 04/01/2021. CHF, CAD, Cardiac cath today, COPD; Risk                 Factors:Morbid obesity, Hypertension and Diabetes. ESRD on HD.  Sonographer:    Merrie Roof RDCS Referring Phys: [id number] [place]  1. Left ventricular ejection fraction, by estimation, is 60 to 65%. The left ventricle has normal function. The left ventricle has no regional wall motion abnormalities. Left ventricular diastolic parameters were normal.  2. Right ventricular systolic function is normal. The right ventricular size is normal. Tricuspid regurgitation signal is inadequate for assessing PA pressure.  3. The mitral valve is grossly normal. No evidence of mitral valve regurgitation. No evidence of mitral stenosis.  4. The aortic valve is tricuspid. There is mild calcification of the aortic valve. Aortic valve regurgitation is not visualized. Aortic valve sclerosis/calcification is present, without any evidence of aortic stenosis.  5. The inferior vena cava is normal in size with greater than 50% respiratory variability, suggesting right atrial pressure of 3 mmHg. Comparison(s): No significant change from prior study. FINDINGS  Left Ventricle: Left ventricular ejection fraction, by estimation, is 60 to 65%. The left ventricle has normal function. The left ventricle has no regional wall motion abnormalities. Definity contrast agent was given IV to delineate the left ventricular  endocardial borders. The left ventricular internal cavity size was normal in size. There is no left ventricular hypertrophy. Left ventricular diastolic parameters were normal. Right Ventricle: The right ventricular size is normal. No increase in right ventricular wall thickness. Right ventricular systolic function is normal. Tricuspid regurgitation signal is inadequate for assessing PA pressure. Left Atrium: Left atrial size was normal in size. Right Atrium: Right atrial size was normal in size. Pericardium:  There is no evidence of pericardial effusion. Mitral Valve: The mitral valve is grossly normal. Mild mitral annular calcification. No evidence of mitral valve regurgitation. No evidence of mitral valve stenosis. Tricuspid Valve: The tricuspid valve is grossly normal. Tricuspid valve regurgitation is trivial. No evidence of tricuspid stenosis. Aortic Valve: The aortic valve is tricuspid. There is mild calcification of the aortic valve. Aortic valve regurgitation is not visualized. Aortic valve sclerosis/calcification is present, without any evidence of aortic stenosis. Aortic valve mean gradient measures 6.0 mmHg. Aortic valve peak gradient measures 10.1 mmHg. Aortic valve area, by VTI measures 2.41 cm. Pulmonic Valve: The pulmonic valve was grossly normal. Pulmonic valve regurgitation is not visualized. No evidence of pulmonic stenosis. Aorta: The aortic root and ascending aorta are structurally normal, with no evidence of dilitation. Venous: The inferior vena cava is normal in size with greater than 50% respiratory variability, suggesting right atrial pressure of 3 mmHg. IAS/Shunts: The atrial septum is grossly normal.  LEFT VENTRICLE PLAX 2D LVIDd:         4.39 cm   Diastology LVIDs:         3.18 cm   LV e' medial:    9.14 cm/s LV PW:         1.29 cm   LV E/e' medial:  12.8 LV IVS:  1.16 cm   LV e' lateral:   10.70 cm/s LVOT diam:     2.10 cm   LV E/e' lateral: 10.9 LV SV:         80 LV SV Index:   35 LVOT Area:     3.46 cm  LEFT ATRIUM           Index LA diam:      4.10 cm 1.78 cm/m LA Vol (A2C): 58.2 ml 25.30 ml/m LA Vol (A4C): 94.9 ml 41.25 ml/m  AORTIC VALVE AV Area (Vmax):    2.27 cm AV Area (Vmean):   2.09 cm AV Area (VTI):     2.41 cm AV Vmax:           159.00 cm/s AV Vmean:          115.000 cm/s AV VTI:            0.333 m AV Peak Grad:      10.1 mmHg AV Mean Grad:      6.0 mmHg LVOT Vmax:         104.00 cm/s LVOT Vmean:        69.300 cm/s LVOT VTI:          0.232 m LVOT/AV VTI ratio: 0.70   AORTA Ao Root diam: 3.10 cm Ao Asc diam:  3.10 cm MITRAL VALVE MV Area (PHT): 3.08 cm     SHUNTS MV Decel Time: 246 msec     Systemic VTI:  0.23 m MV E velocity: 117.00 cm/s  Systemic Diam: 2.10 cm MV A velocity: 115.00 cm/s MV E/A ratio:  1.02 Eleonore Chiquito MD Electronically signed by Eleonore Chiquito MD Signature Date/Time: 11/08/2021/5:45:55 PM    Final    UE Venous Duplex ([facility] and [facility] ONLY)  Result Date: 11/07/2021 UPPER VENOUS STUDY  Patient Name:  BROWNING SOUTHWOOD  Date of Exam:   11/07/2021 Medical Rec #: [id number]    Accession #:    [id number] Date of Birth: Nov 12, 1961    Patient Gender: M Patient Age:   42 years Exam Location:  [facility] Procedure:      VAS [place] UPPER EXTREMITY VENOUS DUPLEX Referring Phys: Ezequiel Essex --------------------------------------------------------------------------------  Indications: Edema Limitations: Body habitus, poor ultrasound/tissue interface and patient position, depth of vessels, presence of AVG. Comparison Study: No prior study Performing Technologist: Maudry Mayhew MHA, RDMS, RVT, RDCS  Examination Guidelines: A complete evaluation includes B-mode imaging, spectral Doppler, color Doppler, and power Doppler as needed of all accessible portions of each vessel. Bilateral testing is considered an integral part of a complete examination. Limited examinations for reoccurring indications may be performed as noted.  Right Findings: +----------+------------+---------+-----------+----------+--------------+  RIGHT      Compressible Phasicity Spontaneous Properties    Summary      +----------+------------+---------+-----------+----------+--------------+  IJV                                                      Not visualized  +----------+------------+---------+-----------+----------+--------------+  Subclavian     Full        Yes        Yes       patent                   +----------+------------+---------+-----------+----------+--------------+  Axillary  Yes        Yes       patent                   +----------+------------+---------+-----------+----------+--------------+  Brachial                   Yes        Yes       patent                   +----------+------------+---------+-----------+----------+--------------+  Radial         Full                   Yes       patent                   +----------+------------+---------+-----------+----------+--------------+  Ulnar          Full                   Yes       patent                   +----------+------------+---------+-----------+----------+--------------+  Cephalic                                                 Not visualized  +----------+------------+---------+-----------+----------+--------------+  Basilic                                                  Not visualized  +----------+------------+---------+-----------+----------+--------------+  Left Findings: +----------+------------+---------+-----------+----------+--------------+  LEFT       Compressible Phasicity Spontaneous Properties    Summary      +----------+------------+---------+-----------+----------+--------------+  Subclavian                                               Not visualized  +----------+------------+---------+-----------+----------+--------------+  Summary:  Right: No obvious evidence of deep vein thrombosis involving visualized veins of the right upper extremity.  *See table(s) above for measurements and observations.  Diagnosing physician: Harold Barban MD Electronically signed by Harold Barban MD on 11/07/2021 at 7:42:32 PM.    Final      Discharge Exam: Vitals:   11/09/21 0840 11/09/21 1028  BP: 128/74 (!) 147/63  Pulse: 91 93  Resp: 15 20  Temp:  98 F (36.7 C)  SpO2: 100% 100%   Vitals:   11/09/21 0011 11/09/21 0357 11/09/21 0840 11/09/21 1028  BP: (!) 146/52 (!) 143/68 128/74 (!) 147/63  Pulse: 92 87 91 93  Resp: 18 20 15 20   Temp: 98.4 F (36.9 C) 98.2 F (36.8 C)  98 F (36.7 C)  TempSrc: Oral Oral  Oral   SpO2: 97% 99% 100% 100%  Weight:  127.2 kg    Height:        General: Pt is alert, awake, not in acute distress Cardiovascular: RRR, S1/S2 +, no rubs, no gallops Respiratory: CTA bilaterally, no wheezing, no rhonchi Abdominal: Soft, NT, ND, bowel sounds + Extremities: Left AKA, swelling of the right upper extremity.    The results of  significant diagnostics from this hospitalization (including imaging, microbiology, ancillary and laboratory) are listed below for reference.     Microbiology: Recent Results (from the past 240 hour(s))  Urine Culture     Status: Abnormal   Collection Time: 11/07/21 12:33 AM   Specimen: Urine, Clean Catch  Result Value Ref Range Status   Specimen Description URINE, CLEAN CATCH  Final   Special Requests   Final    NONE Performed at [facility], [address], [place], [place] [address]    Culture MULTIPLE SPECIES PRESENT, SUGGEST RECOLLECTION (A)  Final   Report Status 11/08/2021 FINAL  Final  Resp Panel by RT-PCR (Flu A&B, Covid) Nasopharyngeal Swab     Status: None   Collection Time: 11/07/21  1:13 AM   Specimen: Nasopharyngeal Swab; Nasopharyngeal(NP) swabs in vial transport medium  Result Value Ref Range Status   SARS Coronavirus 2 by RT PCR NEGATIVE NEGATIVE Final    Comment: (NOTE) SARS-CoV-2 target nucleic acids are NOT DETECTED.  The SARS-CoV-2 RNA is generally detectable in upper respiratory specimens during the acute phase of infection. The lowest concentration of SARS-CoV-2 viral copies this assay can detect is 138 copies/mL. A negative result does not preclude SARS-Cov-2 infection and should not be used as the sole basis for treatment or other patient management decisions. A negative result may occur with  improper specimen collection/handling, submission of specimen other than nasopharyngeal swab, presence of viral mutation(s) within the areas targeted by this assay, and inadequate number of viral copies(<138  copies/mL). A negative result must be combined with clinical observations, patient history, and epidemiological information. The expected result is Negative.  Fact Sheet for Patients:  EntrepreneurPulse.com.au  Fact Sheet for Healthcare Providers:  IncredibleEmployment.be  This test is no t yet approved or cleared by the [place] FDA and  has been authorized for detection and/or diagnosis of SARS-CoV-2 by FDA under an Emergency Use Authorization (EUA). This EUA will remain  in effect (meaning this test can be used) for the duration of the COVID-19 declaration under Section 564(b)(1) of the Act, 21 U.S.C.section 360bbb-3(b)(1), unless the authorization is terminated  or revoked sooner.       Influenza A by PCR NEGATIVE NEGATIVE Final   Influenza B by PCR NEGATIVE NEGATIVE Final    Comment: (NOTE) The Xpert Xpress SARS-CoV-2/FLU/RSV plus assay is intended as an aid in the diagnosis of influenza from Nasopharyngeal swab specimens and should not be used as a sole basis for treatment. Nasal washings and aspirates are unacceptable for Xpert Xpress SARS-CoV-2/FLU/RSV testing.  Fact Sheet for Patients: EntrepreneurPulse.com.au  Fact Sheet for Healthcare Providers: IncredibleEmployment.be  This test is not yet approved or cleared by the [place] FDA and has been authorized for detection and/or diagnosis of SARS-CoV-2 by FDA under an Emergency Use Authorization (EUA). This EUA will remain in effect (meaning this test can be used) for the duration of the COVID-19 declaration under Section 564(b)(1) of the Act, 21 U.S.C. section 360bbb-3(b)(1), unless the authorization is terminated or revoked.  Performed at [facility], [place] [address], [place], [place] [address]      Labs: BNP (last 3 results) Recent Labs    11/07/21 0053  BNP 51.7   Basic Metabolic Panel: Recent Labs  Lab  11/07/21 0053 11/07/21 1000 11/08/21 0419 11/09/21 0118  NA 140  --  135 131*  K 3.1*  --  3.4* 4.1  CL 93*  --  93* 93*  CO2 33*  --  28 24  GLUCOSE 96  --  311* 486*  BUN 70*  --  63* 59*  CREATININE 3.77*  --  3.90* 4.18*  CALCIUM 8.6*  --  8.4* 8.9  MG 1.4*  --  1.8  --   PHOS  --  4.6 4.4 4.8*   Liver Function Tests: Recent Labs  Lab 11/07/21 0053 11/08/21 0419 11/09/21 0118  AST 15  --   --   ALT 12  --   --   ALKPHOS 64  --   --   BILITOT 0.3  --   --   PROT 7.3  --   --   ALBUMIN 3.5 3.5 3.3*   No results for input(s): LIPASE, AMYLASE in the last 168 hours. No results for input(s): AMMONIA in the last 168 hours. CBC: Recent Labs  Lab 11/07/21 0053 11/08/21 0419 11/09/21 0118  WBC 8.5 7.2 9.3  NEUTROABS 6.4  --   --   HGB 9.5* 10.5* 10.2*  HCT 31.5* 34.0* 31.5*  MCV 90.8 89.5 85.6  PLT 151 122* 116*   Cardiac Enzymes: No results for input(s): CKTOTAL, CKMB, CKMBINDEX, TROPONINI in the last 168 hours. BNP: Invalid input(s): POCBNP CBG: Recent Labs  Lab 11/07/21 2100 11/08/21 0601 11/08/21 1048 11/08/21 2059 11/09/21 0551  GLUCAP 462* 249* 100* 372* 361*   D-Dimer No results for input(s): DDIMER in the last 72 hours. Hgb A1c Recent Labs    11/08/21 0419  HGBA1C 7.1*   Lipid Profile No results for input(s): CHOL, HDL, LDLCALC, TRIG, CHOLHDL, LDLDIRECT in the last 72 hours. Thyroid function studies No results for input(s): TSH, T4TOTAL, T3FREE, THYROIDAB in the last 72 hours.  Invalid input(s): FREET3 Anemia work up No results for input(s): VITAMINB12, FOLATE, FERRITIN, TIBC, IRON, RETICCTPCT in the last 72 hours. Urinalysis    Component Value Date/Time   COLORURINE STRAW (A) 11/07/2021 0033   APPEARANCEUR CLEAR 11/07/2021 0033   LABSPEC 1.006 11/07/2021 0033   PHURINE 5.0 11/07/2021 0033   GLUCOSEU NEGATIVE 11/07/2021 0033   HGBUR NEGATIVE 11/07/2021 0033   BILIRUBINUR NEGATIVE 11/07/2021 0033   KETONESUR NEGATIVE 11/07/2021 0033    PROTEINUR NEGATIVE 11/07/2021 0033   NITRITE NEGATIVE 11/07/2021 0033   LEUKOCYTESUR NEGATIVE 11/07/2021 0033   Sepsis Labs Invalid input(s): PROCALCITONIN,  WBC,  LACTICIDVEN Microbiology Recent Results (from the past 240 hour(s))  Urine Culture     Status: Abnormal   Collection Time: 11/07/21 12:33 AM   Specimen: Urine, Clean Catch  Result Value Ref Range Status   Specimen Description URINE, CLEAN CATCH  Final   Special Requests   Final    NONE Performed at [facility], [place] [address], [place], [place] [address]    Culture MULTIPLE SPECIES PRESENT, SUGGEST RECOLLECTION (A)  Final   Report Status 11/08/2021 FINAL  Final  Resp Panel by RT-PCR (Flu A&B, Covid) Nasopharyngeal Swab     Status: None   Collection Time: 11/07/21  1:13 AM   Specimen: Nasopharyngeal Swab; Nasopharyngeal(NP) swabs in vial transport medium  Result Value Ref Range Status   SARS Coronavirus 2 by RT PCR NEGATIVE NEGATIVE Final    Comment: (NOTE) SARS-CoV-2 target nucleic acids are NOT DETECTED.  The SARS-CoV-2 RNA is generally detectable in upper respiratory specimens during the acute phase of infection. The lowest concentration of SARS-CoV-2 viral copies this assay can detect is 138 copies/mL. A negative result does not preclude SARS-Cov-2 infection and should not be used as the sole basis for treatment or  other patient management decisions. A negative result may occur with  improper specimen collection/handling, submission of specimen other than nasopharyngeal swab, presence of viral mutation(s) within the areas targeted by this assay, and inadequate number of viral copies(<138 copies/mL). A negative result must be combined with clinical observations, patient history, and epidemiological information. The expected result is Negative.  Fact Sheet for Patients:  EntrepreneurPulse.com.au  Fact Sheet for Healthcare Providers:  IncredibleEmployment.be  This  test is no t yet approved or cleared by the [place] FDA and  has been authorized for detection and/or diagnosis of SARS-CoV-2 by FDA under an Emergency Use Authorization (EUA). This EUA will remain  in effect (meaning this test can be used) for the duration of the COVID-19 declaration under Section 564(b)(1) of the Act, 21 U.S.C.section 360bbb-3(b)(1), unless the authorization is terminated  or revoked sooner.       Influenza A by PCR NEGATIVE NEGATIVE Final   Influenza B by PCR NEGATIVE NEGATIVE Final    Comment: (NOTE) The Xpert Xpress SARS-CoV-2/FLU/RSV plus assay is intended as an aid in the diagnosis of influenza from Nasopharyngeal swab specimens and should not be used as a sole basis for treatment. Nasal washings and aspirates are unacceptable for Xpert Xpress SARS-CoV-2/FLU/RSV testing.  Fact Sheet for Patients: EntrepreneurPulse.com.au  Fact Sheet for Healthcare Providers: IncredibleEmployment.be  This test is not yet approved or cleared by the [place] FDA and has been authorized for detection and/or diagnosis of SARS-CoV-2 by FDA under an Emergency Use Authorization (EUA). This EUA will remain in effect (meaning this test can be used) for the duration of the COVID-19 declaration under Section 564(b)(1) of the Act, 21 U.S.C. section 360bbb-3(b)(1), unless the authorization is terminated or revoked.  Performed at [facility], [place] [address], [place], [place] [address]      Time coordinating discharge: Over 30 minutes  SIGNED:   Darliss Cheney, MD  [facility] 11/09/2021, 10:57 AM  If 7PM-7AM, please contact night-coverage [email or web address]

## 2021-11-09 NOTE — Progress Notes (Signed)
Pt request assistance to have BM on BSC. Help by mobility specialist and NT, no issues reported by staff. Pt call me after stating something popped in his knee. Want pain meds and MD. MD aware.

## 2021-11-09 NOTE — Progress Notes (Signed)
Pt receives out-pt HD at [facility] on MWF. Pt arrives at 12:00 for 12:20 chair time. Spoke to Tiffany at clinic to make clinic aware of pt's dc today and to resume care tomorrow.   Melven Sartorius Renal Navigator [phone]

## 2021-11-09 NOTE — Progress Notes (Addendum)
Patient upset he is going home. Calling daughter for ride

## 2021-11-09 NOTE — Progress Notes (Signed)
[facility] BRIEF PROGRESS NOTE  Ngoc Daughtridge Oct 18, 1961 [id number]  60 year old male ESRD patient on HD MWF at [facility], presented to the ED due to volume overload with chest pain, SOB, orthopnea and pain in his extremities.  Patient is chronically non complaint with dialysis, missing treatments and signing off early on a regular basis due to cramping per patient.  Missed last HD and left 8kg over dry weight on 12/7.    On exam edema noted in RUE > LUE, and some LE edema bilat.  Breath sounds decreased but no crackles present.  CXR showed vascular congestion, no sig edema. 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 RUE edema slightly better today.   Pt had HD here yesterday w/ 2.3 L UF. Pt cramped foregoing further UF.   Pt last HD in OP setting he left 8kg over dry wt.  Pt rarely gets to dry wt due to combination of noncompliance w/ fluid intake and frequent cramping on OP HD.  No need to keep in hospital for further HD. CXR w/o edema. Will not get more fluid off him here then we will get at his OP unit. Instructed him to cut back on fluid intake may be only way to manage his volume status long-term, given his limited UF due to cramping. OK for dc from renal standpoint.   OP HD orders: MWF [facility]  3h  2/2.5 bath  P4  120kg  Hep 2000 Mircera 100 q2wks, last 12/1      Kelly Splinter, MD 11/09/2021, 11:40 AM

## 2021-11-09 NOTE — Progress Notes (Signed)
X ray negative  Pt's CBG 458. Informed MD, patient dressed and ready to leave, daughter waiting . Per MD give sliding scale 6 units and scheduled Humulin and d/c pt. Pt given both insulins. Informed to recheck his blood sugar at home and resume his normal schedule and dose.

## 2021-11-09 NOTE — TOC Progression Note (Signed)
Transition of Care [facility]) - Progression Note    Patient Details  Name: Jeffrey [name] MRN: [id number] Date of Birth: 16-Oct-1961  Transition of Care [facility]) CM/SW Contact  Zenon Mayo, RN Phone Number: 11/09/2021, 10:12 AM  Clinical Narrative:    Per MD patient is for possible dc today and wants an [facility] aide.  NCM went to room to speak with patient, he states he has [facility] for HHPT, [place] but they could not give him an aide.  NCM informed him that spoke with [facility] and they state HHOT can do what the aide does,  and they will add a SW to help with getting pcs services, he states he wants an aide and to try to find one with another agency.  NCM made referral to [facility], they do not have an  [place] aide available.  NCM made referral to [facility] with Alvis Lemmings for [place], [place], [place] and Social Worker, he is able to take referral .  Soc will begin 24 to 48 hrs post dc. Patient was asking for aide to come out on days of HD MWF,  NCM informed him that they can not make promise of what day the HHAIDE can come on but it will not be 3 times a week it may be two times a week and they can not say what day that will be.  NCM informed him he will have to coordinate that with the [facility].   Patient states he lives with his daughter at [place] [address], his PCP is Holley Bouche in [place], patient gets his diaylsis here in [place] on MWF. He has w/chair , hospital bed, bsc, and a walker at home.  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            Expected Discharge Plan and Services                                                 Social Determinants of Health (SDOH) Interventions    Readmission Risk Interventions Readmission Risk Prevention Plan 04/07/2021  Transportation Screening Complete  Medication Review Press photographer) Referral to Pharmacy  PCP or Specialist appointment within 3-5  days of discharge Complete  HRI or Home Care Consult Complete  SW Recovery Care/Counseling Consult Complete  [place] Complete

## 2021-11-09 NOTE — TOC Transition Note (Signed)
Transition of Care [facility]) - CM/SW Discharge Note   Patient Details  Name: Jeffrey [name] MRN: [id number] Date of Birth: 1961/09/17  Transition of Care [facility]) CM/SW Contact:  Zenon Mayo, RN Phone Number: 11/09/2021, 10:24 AM   Clinical Narrative:    Per MD patient is for possible dc today and wants an [facility] aide.  NCM went to room to speak with patient, he states he has [facility] for HHPT, [place] but they could not give him an aide.  NCM informed him that spoke with [facility] and they state HHOT can do what the aide does,  and they will add a SW to help with getting pcs services, he states he wants an aide and to try to find one with another agency.  NCM made referral to [facility], they do not have an  [place] aide available.  NCM made referral to [facility] with Alvis Lemmings for [place], [place], [place] and Social Worker, he is able to take referral .  Soc will begin 24 to 48 hrs post dc. Patient was asking for aide to come out on days of HD MWF,  NCM informed him that they can not make promise of what day the HHAIDE can come on but it will not be 3 times a week it may be two times a week and they can not say what day that will be.  NCM informed him he will have to coordinate that with the [facility].   Patient states he lives with his daughter at [place] [address], his PCP is Holley Bouche in [place], patient gets his diaylsis here in [place] on MWF. He has w/chair , hospital bed, bsc, and a walker at home.  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         Final next level of care: Home w Home Health Services Barriers to Discharge: No Barriers Identified   Patient Goals and CMS Choice Patient states their goals for this hospitalization and ongoing recovery are:: return to daughters home CMS Medicare.gov Compare Post Acute Care list provided to:: Patient Choice offered to / list presented to : Patient  Discharge  Placement                       Discharge Plan and Services                  DME Agency: NA       HH Arranged: PT, OT, Nurse's Aide, Social Work [facility] Agency: [place] [facility] Agency Contacted: 11/09/21 Time [place]: [phone] Representative spoke with at [place]: [place]) Interventions     Readmission Risk Interventions Readmission Risk Prevention Plan 04/07/2021  Transportation Screening Complete  Medication Review Press photographer) Referral to Pharmacy  PCP or Specialist appointment within 3-5 days of discharge Complete  HRI or [place] Complete  SW Recovery Care/Counseling Consult Complete  Palliative Care Screening Not [place] Complete

## 2021-11-09 NOTE — Progress Notes (Signed)
Blood sugar 418. Gave ordered insulin and paged MD with results and interventions.

## 2021-11-09 NOTE — Progress Notes (Signed)
Mobility Specialist Progress Note:   11/09/21 1246  Mobility  Activity Transferred to/from [facility]  Level of Assistance Contact guard assist, steadying assist  Assistive Device None  Distance Ambulated (ft) 2 ft  Mobility Out of bed for toileting  Mobility Response Tolerated well  Mobility performed by Mobility specialist;Nurse tech  $Mobility charge 1 Mobility   Pt requesting to use BSC. No complaints of pain.   [facility] Public librarian Phone [phone] Secondary Phone [phone]

## 2021-11-09 NOTE — Progress Notes (Signed)
Patient discharged: Home with family  Via: Wheelchair   Discharge paperwork given: to patient and family  Reviewed with teach back  IV and telemetry disconnected  Belongings given to patient    

## 2021-11-09 NOTE — Progress Notes (Addendum)
   Postoperative hemodialysis access   Date of Surgery:  11/08/2021 Surgeon: Donzetta Matters  Subjective:  says the swelling in his arm is better.  Says his hand was twice the size it is yesterday  PHYSICAL EXAMINATION:  Vitals:   11/09/21 0011 11/09/21 0357  BP: (!) 146/52 (!) 143/68  Pulse: 92 87  Resp: 18 20  Temp: 98.4 F (36.9 C) 98.2 F (36.8 C)  SpO2: 97% 99%     There is  Thrill  The graft is palpable  The right radial pulse is faintly palpable   ASSESSMENT/PLAN:  Jeffrey [name] is a 60 y.o. year old male who is s/p RUE fistulogram with stent of the right innominate vein.  -per pt, swelling in right arm has significantly improved. -would elevate arm more up on more pillows to also help with swelling. -Dr. Donzetta Matters to see pt later today   Leontine Locket, PA-C [facility] [phone]  I have independently interviewed and examined patient and agree with PA assessment and plan above. [place] for [employer] today from vascular standpoint.   Sanjeev Main C. Donzetta Matters, MD [facility] [place] Office: [phone] Pager: [phone]

## 2021-11-11 ENCOUNTER — Telehealth: Payer: Self-pay | Admitting: Nephrology

## 2021-11-11 NOTE — Telephone Encounter (Signed)
Transition of care contact from inpatient facility  Date of discharge: 11/09/21 Date of contact: 11/11/21 Method: Phone Spoke to: Patient  Patient contacted to discuss transition of care from recent inpatient hospitalization. Patient was admitted to [facility] from 12/10-12/13/22 with discharge diagnosis of fluid overload secondary to missed dialysis/R arm swelling s/p fistulogram s/p stenting.   Medication changes were reviewed.  Patient will follow up with his/her outpatient HD unit on: He states he will go to his scheduled dialysis session tomorrow 12/16.

## 2021-12-02 ENCOUNTER — Encounter: Payer: Self-pay | Admitting: Physician Assistant

## 2021-12-02 ENCOUNTER — Other Ambulatory Visit: Payer: Self-pay

## 2021-12-02 ENCOUNTER — Ambulatory Visit (INDEPENDENT_AMBULATORY_CARE_PROVIDER_SITE_OTHER): Payer: Medicare Other | Admitting: Physician Assistant

## 2021-12-02 VITALS — BP 138/74 | HR 81 | Temp 97.8°F | Resp 20 | Ht 65.0 in | Wt 268.5 lb

## 2021-12-02 DIAGNOSIS — Z992 Dependence on renal dialysis: Secondary | ICD-10-CM | POA: Diagnosis not present

## 2021-12-02 DIAGNOSIS — N186 End stage renal disease: Secondary | ICD-10-CM | POA: Diagnosis not present

## 2021-12-02 NOTE — Progress Notes (Signed)
[facility] [place] HISTORY AND PHYSICAL   History of Present Illness:  Patient is a 61 y.o. 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  Prior to this he  had a right axillary loop graft at [facility].  Prior to his graft placement, he underwent a venogram that did show some narrowing from his catheter but otherwise the central venous system was patent.  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  This was dilated with balloon venoplasty with approximate 20% residual stenosis.  He did not get significant relief from this however his arm has continued to become more edematous.   He recently had right shoulder surgery and is recovering from that.  He denise right hand pain, loss of motor or loss of sensation.  Hi swelling has improved significantly.  He is on dialysis Monday Wednesday Friday.   Past Medical History:  Diagnosis Date   Arthritis    Blood transfusion without reported diagnosis    CHF (congestive heart failure) (HCC)    COPD (chronic obstructive pulmonary disease) ([place])    Diabetes mellitus without complication ([place])    Hypertension    PTSD (post-traumatic stress disorder)    Renal disorder    Stage 5 Kidney Failure    Past Surgical History:  Procedure Laterality Date   A/V FISTULAGRAM N/A 08/30/2021   Procedure: A/V FISTULAGRAM;  Surgeon: Waynetta Sandy, MD;  Location: [place] CV [facility];  Service: Cardiovascular;  Laterality: N/A;   A/V SHUNTOGRAM N/A 11/08/2021   Procedure: A/V SHUNTOGRAM;  Surgeon: Waynetta Sandy, MD;  Location: [place] [facility];  Service: Cardiovascular;  Laterality: N/A;   AMPUTATION Left 04/03/2021   Procedure: LEFT ABOVE-THE-KNEE AMPUTATION;  Surgeon: Erle Crocker, MD;  Location: [place];  Service: Orthopedics;  Laterality: Left;    AV FISTULA PLACEMENT Right 08/12/2021   Procedure: INSERTION OF ARTERIOVENOUS (AV) GORE-TEX GRAFT RIGHT ARM;  Surgeon: Serafina Mitchell, MD;  Location: [place];  Service: Vascular;  Laterality: Right;   IR REMOVAL TUN CV CATH W/O FL  03/30/2021   JOINT REPLACEMENT     Bilateral knees   PERIPHERAL VASCULAR BALLOON ANGIOPLASTY Right 08/30/2021   Procedure: PERIPHERAL VASCULAR BALLOON ANGIOPLASTY;  Surgeon: Waynetta Sandy, MD;  Location: [place] CV [facility];  Service: Cardiovascular;  Laterality: Right;   PERIPHERAL VASCULAR INTERVENTION  11/08/2021   Procedure: PERIPHERAL VASCULAR INTERVENTION;  Surgeon: Waynetta Sandy, MD;  Location: [place] [facility];  Service: Cardiovascular;;  Central rt arm fistula   UPPER EXTREMITY VENOGRAPHY Right 08/10/2021   Procedure: UPPER EXTREMITY VENOGRAPHY;  Surgeon: Serafina Mitchell, MD;  Location: [place] CV [facility];  Service: Cardiovascular;  Laterality: Right;     Social History Social History   Tobacco Use   Smoking status: Former    Types: Cigarettes    Quit date: 2012    Years since quitting: 11.0   Smokeless tobacco: Never  Vaping Use   Vaping Use: Never used  Substance Use Topics   Alcohol use: Yes    Comment: occasionally   Drug use: Not Currently    Comment: Years ago    Family History No family history on file.  Allergies  Allergies  Allergen Reactions   Bupropion Swelling   Dexmedetomidine Nausea And Vomiting    Other reaction(s): Other (See Comments) Dose-limiting  bradycardia Dose-limiting bradycardia    Ibuprofen Shortness Of Breath and Swelling    Pt tolerates aspirin Pt tolerates aspirin    Pioglitazone Anaphylaxis and Rash   Tomato Anaphylaxis    Only allergic to RAW tomatoes     Current Outpatient Medications  Medication Sig Dispense Refill   ACCU-CHEK GUIDE test strip 4 (four) times daily.     acetaminophen (TYLENOL) 325 MG tablet Take 2 tablets (650 mg total) by mouth every 6 (six) hours as  needed for moderate pain. 20 tablet 0   albuterol (PROVENTIL) (2.5 MG/3ML) 0.083% nebulizer solution Inhale 2.5 mg into the lungs every 8 (eight) hours as needed for wheezing or shortness of breath.     albuterol (VENTOLIN HFA) 108 (90 Base) MCG/ACT inhaler Inhale 1-2 puffs into the lungs every 6 (six) hours as needed for wheezing or shortness of breath.     amLODipine (NORVASC) 10 MG tablet Take 10 mg by mouth at bedtime.     ASPIRIN LOW DOSE 81 MG EC tablet Take 81 mg by mouth daily.     atorvastatin (LIPITOR) 40 MG tablet Take 40 mg by mouth daily.     calcium acetate (PHOSLO) 667 MG capsule Take 2 capsules (1,334 mg total) by mouth with breakfast, with lunch, and with evening meal.     carvedilol (COREG) 12.5 MG tablet Take 12.5 mg by mouth 2 (two) times daily with a meal.     Cholecalciferol 50 MCG (2000 UT) CAPS Take 2,000 Units by mouth daily.     Continuous Blood Gluc Receiver (DEXCOM G6 RECEIVER) DEVI Use 1 Device continuously     diphenhydrAMINE (BENADRYL) 25 MG tablet Take 50 mg by mouth every 6 (six) hours as needed for allergies.     Emollient (OINTMENT BASE) OINT Apply 1 application topically daily as needed (dry skin).     glucagon 1 MG injection Inject 1 mg into the muscle as needed (low blood sugar).     insulin regular human CONCENTRATED (HUMULIN R U-500 KWIKPEN) 500 UNIT/ML KwikPen Inject 80-140 Units into the skin 2 (two) times daily with a meal. For dialysis days give 80 units in am, if tray comes early to go to dialysis early may given medication 1-2 hours earlier, page MD for questions - If BG < 70, treat per hypoglycemia protocol. - If BG < 70 or NPO or any other dosing concerns, page MD. Note this insulin is 5 times as concentrated as regular insulin U-100. Verify dose and volume prior to administration. 0   CONCENTRATED insulin REGULAR (HUMULIN R U-500 "CONCENTRATED") 500 unit/mL injection 140 Units, Subcutaneous, User specified (Once per day on Mon Wed Fri Sun).   Instructions:   Non-Dialysis days give 140 units in the am - If NPO, hold dose. - If BG < 70, treat per hypoglycemia protocol. - If BG < 70 or any other dosing concerns, page MD. Note this insulin is 5 times as concentrated as regular insulin U-100. Verify dose and volume prior to administration. 0     insulin regular human CONCENTRATED (HUMULIN R U-500 KWIKPEN) 500 UNIT/ML KwikPen Inject 45 Units into the skin daily with supper. (Patient not taking: Reported on 11/07/2021) 2.7 mL 0   insulin regular human CONCENTRATED (HUMULIN R U-500 KWIKPEN) 500 UNIT/ML KwikPen Inject 60 Units into the skin daily with breakfast. (Patient taking differently: Inject 100-125 Units into the skin See admin instructions. 125 units with breakfast 100 units in the evening) 3.6 mL 0   lidocaine (LIDODERM) 5 %  Place 3 patches onto the skin daily. Remove & Discard patch within 12 hours or as directed by MD (Patient not taking: Reported on 11/07/2021) 30 patch 0   metolazone (ZAROXOLYN) 10 MG tablet Take 10 mg by mouth daily.     nortriptyline (PAMELOR) 10 MG capsule Take 20 mg by mouth at bedtime.     Nutritional Supplements (,FEEDING SUPPLEMENT, PROSOURCE PLUS) liquid Take 30 mLs by mouth 2 (two) times daily between meals.     oxyCODONE-acetaminophen (PERCOCET/ROXICET) 5-325 MG tablet Take 1 tablet by mouth every 6 (six) hours as needed for moderate pain or severe pain. 20 tablet 0   pantoprazole (PROTONIX) 20 MG tablet Take 20 mg by mouth daily.     polyethylene glycol powder (GLYCOLAX/MIRALAX) 17 GM/SCOOP powder Take 17 g by mouth daily as needed for mild constipation.     pregabalin (LYRICA) 25 MG capsule Take 1 capsule (25 mg total) by mouth 2 (two) times daily. (Patient not taking: Reported on 11/07/2021)     senna-docusate (SENOKOT-S) 8.6-50 MG tablet Take 2 tablets by mouth 2 (two) times daily as needed for mild constipation.     sertraline (ZOLOFT) 100 MG tablet Take 100 mg by mouth daily.     tamsulosin (FLOMAX)  0.4 MG CAPS capsule Take 1 capsule (0.4 mg total) by mouth daily. 30 capsule 0   torsemide (DEMADEX) 100 MG tablet Take 1 tablet (100 mg total) by mouth every Tuesday, Thursday, Saturday, and Sunday.     No current facility-administered medications for this visit.    ROS:   General:  No weight loss, Fever, chills  HEENT: No recent headaches, no nasal bleeding, no visual changes, no sore throat  Neurologic: No dizziness, blackouts, seizures. No recent symptoms of stroke or mini- stroke. No recent episodes of slurred speech, or temporary blindness.  Cardiac: No recent episodes of chest pain/pressure, no shortness of breath at rest.  No shortness of breath with exertion.  Denies history of atrial fibrillation or irregular heartbeat  Vascular: No history of rest pain in feet.  No history of claudication.  No history of non-healing ulcer, No history of DVT   Pulmonary: No home oxygen, no productive cough, no hemoptysis,  No asthma or wheezing  Musculoskeletal:  [ ]  Arthritis, [ ]  Low back pain,  [ ]  Joint pain  Hematologic:No history of hypercoagulable state.  No history of easy bleeding.  No history of anemia  Gastrointestinal: No hematochezia or melena,  No gastroesophageal reflux, no trouble swallowing  Urinary: [ ]  chronic Kidney disease, [ ]  on HD - [ ]  MWF or [ ]  TTHS, [ ]  Burning with urination, [ ]  Frequent urination, [ ]  Difficulty urinating;   Skin: No rashes  Psychological: No history of anxiety,  No history of depression   Physical Examination  Vitals:   12/02/21 1433  BP: 138/74  Pulse: 81  Resp: 20  Temp: 97.8 F (36.6 C)  TempSrc: Temporal  SpO2: 97%  Weight: 268 lb 8.3 oz (121.8 kg)  Height: 5\' 5"  (1.651 m)    Body mass index is 44.68 kg/m.  General:  Alert and oriented, no acute distress HEENT: Normal Neck: No bruit or JVD Pulmonary: Clear to auscultation bilaterally Cardiac: Regular Rate and Rhythm without murmur Gastrointestinal: Soft, non-tender,  non-distended, no mass, no scars Skin: No rash Extremity Pulses:  palpable radial pulses bilaterally, palpable thrill in the graft Musculoskeletal: No deformity or edema       ASSESSMENT: ESRD s/p fistulogram with right  innominate vein stent placement No apparent edema in the right UE.    PLAN:The graft may be used for HD.  The patient wants to wait until his shoulder has recovered from his recent surgery.  F/U PRN.  Roxy Horseman PA-C [facility] [place] Office: [phone]  MD in clinic [place]

## 2021-12-24 ENCOUNTER — Emergency Department (HOSPITAL_COMMUNITY)
Admission: EM | Admit: 2021-12-24 | Discharge: 2021-12-25 | Disposition: A | Discharge status: Home / Self Care | Payer: Medicare Other | Attending: Emergency Medicine | Admitting: Emergency Medicine

## 2021-12-24 ENCOUNTER — Other Ambulatory Visit: Payer: Self-pay

## 2021-12-24 DIAGNOSIS — Z992 Dependence on renal dialysis: Secondary | ICD-10-CM | POA: Diagnosis not present

## 2021-12-24 DIAGNOSIS — Z7982 Long term (current) use of aspirin: Secondary | ICD-10-CM | POA: Insufficient documentation

## 2021-12-24 DIAGNOSIS — Z79899 Other long term (current) drug therapy: Secondary | ICD-10-CM | POA: Diagnosis not present

## 2021-12-24 DIAGNOSIS — R109 Unspecified abdominal pain: Secondary | ICD-10-CM | POA: Diagnosis not present

## 2021-12-24 DIAGNOSIS — R252 Cramp and spasm: Secondary | ICD-10-CM | POA: Insufficient documentation

## 2021-12-24 DIAGNOSIS — I509 Heart failure, unspecified: Secondary | ICD-10-CM | POA: Insufficient documentation

## 2021-12-24 DIAGNOSIS — R200 Anesthesia of skin: Secondary | ICD-10-CM | POA: Insufficient documentation

## 2021-12-24 DIAGNOSIS — E876 Hypokalemia: Secondary | ICD-10-CM | POA: Diagnosis not present

## 2021-12-24 DIAGNOSIS — R202 Paresthesia of skin: Secondary | ICD-10-CM

## 2021-12-24 DIAGNOSIS — N186 End stage renal disease: Secondary | ICD-10-CM | POA: Insufficient documentation

## 2021-12-24 DIAGNOSIS — R519 Headache, unspecified: Secondary | ICD-10-CM | POA: Insufficient documentation

## 2021-12-24 LAB — COMPREHENSIVE METABOLIC PANEL
ALT: 17 U/L (ref 0–44)
AST: 18 U/L (ref 15–41)
Albumin: 3.5 g/dL (ref 3.5–5.0)
Alkaline Phosphatase: 77 U/L (ref 38–126)
Anion gap: 13 (ref 5–15)
BUN: 62 mg/dL — ABNORMAL HIGH (ref 8–23)
CO2: 33 mmol/L — ABNORMAL HIGH (ref 22–32)
Calcium: 8.3 mg/dL — ABNORMAL LOW (ref 8.9–10.3)
Chloride: 92 mmol/L — ABNORMAL LOW (ref 98–111)
Creatinine, Ser: 3.32 mg/dL — ABNORMAL HIGH (ref 0.61–1.24)
GFR, Estimated: 20 mL/min — ABNORMAL LOW (ref >60)
Glucose, Bld: 310 mg/dL — ABNORMAL HIGH (ref 70–99)
Potassium: 2.8 mmol/L — ABNORMAL LOW (ref 3.5–5.1)
Sodium: 138 mmol/L (ref 135–145)
Total Bilirubin: 0.3 mg/dL (ref 0.3–1.2)
Total Protein: 7.4 g/dL (ref 6.5–8.1)

## 2021-12-24 LAB — CBC WITH DIFFERENTIAL/PLATELET
Abs Immature Granulocytes: 0.07 10*3/uL (ref 0.00–0.07)
Basophils Absolute: 0 10*3/uL (ref 0.0–0.1)
Basophils Relative: 0 %
Eosinophils Absolute: 0.1 10*3/uL (ref 0.0–0.5)
Eosinophils Relative: 1 %
HCT: 36.6 % — ABNORMAL LOW (ref 39.0–52.0)
Hemoglobin: 11.1 g/dL — ABNORMAL LOW (ref 13.0–17.0)
Immature Granulocytes: 1 %
Lymphocytes Relative: 9 %
Lymphs Abs: 1 10*3/uL (ref 0.7–4.0)
MCH: 27.2 pg (ref 26.0–34.0)
MCHC: 30.3 g/dL (ref 30.0–36.0)
MCV: 89.7 fL (ref 80.0–100.0)
Monocytes Absolute: 0.6 10*3/uL (ref 0.1–1.0)
Monocytes Relative: 6 %
Neutro Abs: 9.2 10*3/uL — ABNORMAL HIGH (ref 1.7–7.7)
Neutrophils Relative %: 83 %
Platelets: 146 10*3/uL — ABNORMAL LOW (ref 150–400)
RBC: 4.08 MIL/uL — ABNORMAL LOW (ref 4.22–5.81)
RDW: 18.5 % — ABNORMAL HIGH (ref 11.5–15.5)
WBC: 11 10*3/uL — ABNORMAL HIGH (ref 4.0–10.5)
nRBC: 0 % (ref 0.0–0.2)

## 2021-12-24 LAB — MAGNESIUM: Magnesium: 1.5 mg/dL — ABNORMAL LOW (ref 1.7–2.4)

## 2021-12-24 MED ORDER — FENTANYL CITRATE PF 50 MCG/ML IJ SOSY
50.0000 ug | PREFILLED_SYRINGE | Freq: Once | INTRAMUSCULAR | Status: AC
  Administered 2021-12-25: 50 ug via INTRAVENOUS
  Filled 2021-12-24: qty 1

## 2021-12-24 NOTE — ED Triage Notes (Signed)
Pt BIB EMS from home. Pt is a dialysis pt MWF - went today and shortened treatment by 29 mins. Per pt he always gets cramping and right sided weakness after dialysis but today it was worse. CBG high with EMS at 405. Pt states he took 125 units of insulin prior. 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

## 2021-12-24 NOTE — ED Provider Notes (Signed)
[facility] EMERGENCY DEPARTMENT Provider Note   CSN: [id number] Arrival date & time: 12/24/21  2251     History  Chief Complaint  Patient presents with   Abdominal Cramping   Numbness    Jeffrey [name] is a 61 y.o. male.  The history is provided by the patient and medical records.  Abdominal Cramping Jeffrey [name] is a 61 y.o. male who presents to the Emergency Department complaining of cramping.  He presents to the ED for evaluation of diffuse body cramping that started during dialysis today.  He has a history of ESRD.  Since he started dialysis 2 years ago he experiences diffuse body cramping at the end of his sessions.  Due to this cramping he only has 3-hour sessions.  Today he developed cramping 29 minutes before the end of his 3-hour session.  He states that the cramping is more intense than usual and now he has right upper extremity and right lower extremity numbness that started this evening.  He was last known well and without numbness around 6 PM.  No associated fever, chest pain, difficulty breathing.  No nausea.  He makes urine.  He does have a mild headache that started this afternoon.  He does not take anticoagulation.  He has a right upper extremity graft, which was accessed for dialysis today.  He also has a chest catheter in place.  He does get heparin during his dialysis sessions.  He dialyzes at [facility] and he sees Dr. Carolin Sicks with nephrology.       Home Medications Prior to Admission medications   Medication Sig Start Date End Date Taking? Authorizing Provider  ACCU-CHEK GUIDE test strip 4 (four) times daily. 11/15/20   [provider]  acetaminophen (TYLENOL) 325 MG tablet Take 2 tablets (650 mg total) by mouth every 6 (six) hours as needed for moderate pain. 04/14/21   Raiford Noble Latif, DO  albuterol (PROVENTIL) (2.5 MG/3ML) 0.083% nebulizer solution Inhale 2.5 mg into the lungs every 8 (eight) hours as needed for wheezing or  shortness of breath.    [provider]  albuterol (VENTOLIN HFA) 108 (90 Base) MCG/ACT inhaler Inhale 1-2 puffs into the lungs every 6 (six) hours as needed for wheezing or shortness of breath.    [provider]  amLODipine (NORVASC) 10 MG tablet Take 10 mg by mouth at bedtime. 04/17/20   [provider]  ASPIRIN LOW DOSE 81 MG EC tablet Take 81 mg by mouth daily. 02/22/21   [provider]  atorvastatin (LIPITOR) 40 MG tablet Take 40 mg by mouth daily. 02/09/21   [provider]  calcium acetate (PHOSLO) 667 MG capsule Take 2 capsules (1,334 mg total) by mouth with breakfast, with lunch, and with evening meal. 04/10/21   Debbe Odea, MD  carvedilol (COREG) 12.5 MG tablet Take 12.5 mg by mouth 2 (two) times daily with a meal. 04/17/20   [provider]  Cholecalciferol 50 MCG (2000 UT) CAPS Take 2,000 Units by mouth daily. 08/24/20   [provider]  Continuous Blood Gluc Receiver (DEXCOM G6 RECEIVER) DEVI Use 1 Device continuously 03/08/21   [provider]  diphenhydrAMINE (BENADRYL) 25 MG tablet Take 50 mg by mouth every 6 (six) hours as needed for allergies.    [provider]  Emollient (OINTMENT BASE) OINT Apply 1 application topically daily as needed (dry skin). 06/23/21 06/23/22  [provider]  glucagon 1 MG injection Inject 1 mg into the muscle as  needed (low blood sugar). 06/23/21   [provider]  insulin regular human CONCENTRATED (HUMULIN R U-500 KWIKPEN) 500 UNIT/ML KwikPen Inject 80-140 Units into the skin 2 (two) times daily with a meal. For dialysis days give 80 units in am, if tray comes early to go to dialysis early may given medication 1-2 hours earlier, page MD for questions - If BG < 70, treat per hypoglycemia protocol. - If BG < 70 or NPO or any other dosing concerns, page MD. Note this insulin is 5 times as concentrated as regular insulin U-100. Verify dose and volume prior to  administration. 0   CONCENTRATED insulin REGULAR (HUMULIN R U-500 "CONCENTRATED") 500 unit/mL injection 140 Units, Subcutaneous, User specified (Once per day on Mon Wed Fri Sun).  Instructions:   Non-Dialysis days give 140 units in the am - If NPO, hold dose. - If BG < 70, treat per hypoglycemia protocol. - If BG < 70 or any other dosing concerns, page MD. Note this insulin is 5 times as concentrated as regular insulin U-100. Verify dose and volume prior to administration. 0 08/31/21   Domenic Polite, MD  insulin regular human CONCENTRATED (HUMULIN R U-500 KWIKPEN) 500 UNIT/ML KwikPen Inject 45 Units into the skin daily with supper. Patient not taking: Reported on 11/07/2021 09/04/21 10/04/21  [name], MD  insulin regular human CONCENTRATED (HUMULIN R U-500 KWIKPEN) 500 UNIT/ML KwikPen Inject 60 Units into the skin daily with breakfast. Patient taking differently: Inject 100-125 Units into the skin See admin instructions. 125 units with breakfast 100 units in the evening 09/05/21 11/07/21  [name], MD  lidocaine (LIDODERM) 5 % Place 3 patches onto the skin daily. Remove & Discard patch within 12 hours or as directed by MD Patient not taking: Reported on 11/07/2021 08/17/21   Alma Friendly, MD  metolazone (ZAROXOLYN) 10 MG tablet Take 10 mg by mouth daily. 07/13/21   [provider]  nortriptyline (PAMELOR) 10 MG capsule Take 20 mg by mouth at bedtime. 10/23/19 02/22/22  [provider]  Nutritional Supplements (,FEEDING SUPPLEMENT, PROSOURCE PLUS) liquid Take 30 mLs by mouth 2 (two) times daily between meals. 04/11/21   Debbe Odea, MD  oxyCODONE-acetaminophen (PERCOCET/ROXICET) 5-325 MG tablet Take 1 tablet by mouth every 6 (six) hours as needed for moderate pain or severe pain. 08/31/21   Domenic Polite, MD  pantoprazole (PROTONIX) 20 MG tablet Take 20 mg by mouth daily. 02/09/21   [provider]  polyethylene glycol powder (GLYCOLAX/MIRALAX) 17  GM/SCOOP powder Take 17 g by mouth daily as needed for mild constipation. 08/24/20   [provider]  pregabalin (LYRICA) 25 MG capsule Take 1 capsule (25 mg total) by mouth 2 (two) times daily. Patient not taking: Reported on 11/07/2021 08/17/21   Alma Friendly, MD  senna-docusate (SENOKOT-S) 8.6-50 MG tablet Take 2 tablets by mouth 2 (two) times daily as needed for mild constipation. 10/23/19   [provider]  sertraline (ZOLOFT) 100 MG tablet Take 100 mg by mouth daily. 02/22/21   [provider]  tamsulosin (FLOMAX) 0.4 MG CAPS capsule Take 1 capsule (0.4 mg total) by mouth daily. 11/10/21   Darliss Cheney, MD  torsemide (DEMADEX) 100 MG tablet Take 1 tablet (100 mg total) by mouth every Tuesday, Thursday, Saturday, and Sunday. 08/17/21   Alma Friendly, MD      Allergies    Bupropion, Dexmedetomidine, Ibuprofen, Pioglitazone, and Tomato    Review of Systems   Review of Systems  All other systems reviewed and are negative.  Physical Exam Updated Vital Signs BP (!) 130/59    Pulse 94    Temp 99.2 F (37.3 C) (Oral)    Resp 20    Ht 5\' 5"  (1.651 m)    Wt 124 kg    SpO2 92%    BMI 45.49 kg/m  Physical Exam Vitals and nursing note reviewed.  Constitutional:      Appearance: He is well-developed.  HENT:     Head: Normocephalic and atraumatic.  Cardiovascular:     Rate and Rhythm: Normal rate and regular rhythm.     Heart sounds: No murmur heard. Pulmonary:     Effort: Pulmonary effort is normal. No respiratory distress.     Breath sounds: Normal breath sounds.  Abdominal:     Palpations: Abdomen is soft.     Tenderness: There is no abdominal tenderness. There is no guarding or rebound.  Musculoskeletal:        General: No tenderness.     Comments: Left lower extremity AKA  Skin:    General: Skin is warm and dry.  Neurological:     Mental Status: He is alert and oriented to person, place, and time.     Comments: Altered sensation to light  touch over right face, right arm and chronic, right abdominal wall, right leg.  2-3 out of 5 strength in the right upper extremity, right lower extremity.  He states that he has pain that limits his ability to move these extremities.  4+ out of 5 strength in the left upper extremity.  Unable to assess left lower extremity due to amputation.  Psychiatric:        Behavior: Behavior normal.    ED Results / Procedures / Treatments   Labs (all labs ordered are listed, but only abnormal results are displayed) Labs Reviewed  CBC WITH DIFFERENTIAL/PLATELET - Abnormal; Notable for the following components:      Result Value   WBC 11.0 (*)    RBC 4.08 (*)    Hemoglobin 11.1 (*)    HCT 36.6 (*)    RDW 18.5 (*)    Platelets 146 (*)    Neutro Abs 9.2 (*)    All other components within normal limits  COMPREHENSIVE METABOLIC PANEL  MAGNESIUM    EKG None  Radiology No results found.  Procedures Procedures    Medications Ordered in ED Medications - No data to display  ED Course/ Medical Decision Making/ A&P                           Medical Decision Making Amount and/or Complexity of Data Reviewed Labs: ordered. Radiology: ordered.  Risk Prescription drug management.   Patient with history of ESRD on hemodialysis, CHF here for evaluation of cramping after dialysis as well as right upper extremity and right lower extremity weakness and numbness.  He does have decreased strength and sensation to the right side of his body but there is definitely an effort component to his examination.  He states that pain limits his strength testing.  Labs significant for mild hypokalemia, hypomagnesemia.  Would not replace at this point in time given his end-stage renal disease.  After treatment with pain medications in the department his pain is improved.  On reassessment he does still have ongoing altered sensation as well as weakness but is stronger than on prior assessment.  Plan to obtain MRI to  rule out  CVA.  Patient care transferred pending MRI.       Final Clinical Impression(s) / ED Diagnoses Final diagnoses:  None    Rx / DC Orders ED Discharge Orders     None         Quintella Reichert, MD 12/25/21 [phone]

## 2021-12-25 ENCOUNTER — Emergency Department (HOSPITAL_COMMUNITY): Payer: Medicare Other

## 2021-12-25 DIAGNOSIS — R200 Anesthesia of skin: Secondary | ICD-10-CM | POA: Diagnosis not present

## 2021-12-25 MED ORDER — ACETAMINOPHEN 325 MG PO TABS
650.0000 mg | ORAL_TABLET | Freq: Once | ORAL | Status: AC
Start: 2021-12-25 — End: 2021-12-25
  Administered 2021-12-25: 650 mg via ORAL
  Filled 2021-12-25: qty 2

## 2021-12-25 MED ORDER — OXYCODONE HCL 5 MG PO TABS: 5.0000 mg | ORAL_TABLET | Freq: Four times a day (QID) | ORAL | 0 refills | Status: DC | PRN

## 2021-12-25 MED ORDER — POTASSIUM CHLORIDE CRYS ER 20 MEQ PO TBCR
20.0000 meq | EXTENDED_RELEASE_TABLET | Freq: Once | ORAL | Status: AC
  Administered 2021-12-25: 20 meq via ORAL
  Filled 2021-12-25: qty 1

## 2021-12-25 MED ORDER — CYCLOBENZAPRINE HCL 10 MG PO TABS: 10.0000 mg | ORAL_TABLET | Freq: Two times a day (BID) | ORAL | 0 refills | Status: DC | PRN

## 2021-12-25 NOTE — ED Notes (Signed)
Patient transported to MRI 

## 2021-12-25 NOTE — ED Notes (Signed)
Pt to ct 

## 2021-12-25 NOTE — ED Notes (Signed)
ED provider at bedside per patient request.

## 2021-12-25 NOTE — ED Provider Notes (Addendum)
MRI negative for stroke.  Patient feeling better.  Discharged. Rx for pain given.   Lennice Sites, DO 12/25/21 [id number]    Lennice Sites, DO 12/25/21 1000

## 2021-12-25 NOTE — ED Notes (Signed)
Returned form MRI.

## 2021-12-30 DIAGNOSIS — N401 Enlarged prostate with lower urinary tract symptoms: Secondary | ICD-10-CM | POA: Insufficient documentation

## 2022-01-03 ENCOUNTER — Encounter (HOSPITAL_COMMUNITY): Payer: Self-pay | Admitting: Vascular Surgery

## 2022-02-10 ENCOUNTER — Ambulatory Visit: Payer: Medicare Other | Admitting: Rehabilitation

## 2022-03-01 ENCOUNTER — Encounter: Payer: Self-pay | Admitting: Rehabilitation

## 2022-03-01 ENCOUNTER — Ambulatory Visit
Payer: Medicare Other | Attending: Student in an Organized Health Care Education/Training Program | Admitting: Rehabilitation

## 2022-03-01 DIAGNOSIS — R2689 Other abnormalities of gait and mobility: Secondary | ICD-10-CM | POA: Insufficient documentation

## 2022-03-01 DIAGNOSIS — R2681 Unsteadiness on feet: Secondary | ICD-10-CM | POA: Insufficient documentation

## 2022-03-01 DIAGNOSIS — M6281 Muscle weakness (generalized): Secondary | ICD-10-CM | POA: Diagnosis present

## 2022-03-01 DIAGNOSIS — Z96651 Presence of right artificial knee joint: Secondary | ICD-10-CM | POA: Diagnosis not present

## 2022-03-01 DIAGNOSIS — R293 Abnormal posture: Secondary | ICD-10-CM | POA: Insufficient documentation

## 2022-03-01 NOTE — Therapy (Signed)
? ?OUTPATIENT PHYSICAL THERAPY PROSTHETICS EVALUATION ? ? ?Patient Name: Jeffrey [name] ?MRN: [id number] ?DOB:[birth year], 61 y.o., male ?Today's Date: 03/01/2022 ? ?PCP: Pcp, No ?REFERRING PROVIDER: Wilder Glade, MD ? ? PT End of Session - 03/01/22 1451   ? ? Visit Number 1   ? Number of Visits 25   ? Date for PT Re-Evaluation 05/30/22   ? Authorization Type Medicare and Medicaid (needs 10th visit progress note)   ? PT Start Time 1448   ? PT Stop Time 1530   ? PT Time Calculation (min) 42 min   ? Equipment Utilized During Treatment Other (comment)   L prosthesis  ? Activity Tolerance Patient limited by pain;Patient limited by fatigue   ? Behavior During Therapy [facility] for tasks assessed/performed   ? ?  ?  ? ?  ? ? ?Past Medical History:  ?Diagnosis Date  ? Arthritis   ? Blood transfusion without reported diagnosis   ? CHF (congestive heart failure) ([place])   ? COPD (chronic obstructive pulmonary disease) ([place])   ? Diabetes mellitus without complication ([place])   ? Hypertension   ? PTSD (post-traumatic stress disorder)   ? Renal disorder   ? Stage 5 Kidney Failure  ? ?Past Surgical History:  ?Procedure Laterality Date  ? A/V FISTULAGRAM N/A 08/30/2021  ? Procedure: A/V FISTULAGRAM;  Surgeon: Waynetta Sandy, MD;  Location: [place] [facility];  Service: Cardiovascular;  Laterality: N/A;  ? A/V SHUNTOGRAM N/A 11/08/2021  ? Procedure: A/V SHUNTOGRAM;  Surgeon: Waynetta Sandy, MD;  Location: [place] [facility];  Service: Cardiovascular;  Laterality: N/A;  ? AMPUTATION Left 04/03/2021  ? Procedure: LEFT ABOVE-THE-KNEE AMPUTATION;  Surgeon: Erle Crocker, MD;  Location: [place];  Service: Orthopedics;  Laterality: Left;  ? AV FISTULA PLACEMENT Right 08/12/2021  ? Procedure: INSERTION OF ARTERIOVENOUS (AV) GORE-TEX GRAFT RIGHT ARM;  Surgeon: Serafina Mitchell, MD;  [facility];  Service: Vascular;  Laterality: Right;  ? IR REMOVAL TUN CV CATH W/O FL  03/30/2021  ? JOINT REPLACEMENT    ? Bilateral  knees  ? PERIPHERAL VASCULAR BALLOON ANGIOPLASTY Right 08/30/2021  ? Procedure: PERIPHERAL VASCULAR BALLOON ANGIOPLASTY;  Surgeon: Waynetta Sandy, MD;  Location: [place] [facility];  Service: Cardiovascular;  Laterality: Right;  ? PERIPHERAL VASCULAR INTERVENTION  11/08/2021  ? Procedure: PERIPHERAL VASCULAR INTERVENTION;  Surgeon: Waynetta Sandy, MD;  Location: [place] [facility];  Service: Cardiovascular;;  Central rt arm fistula  ? UPPER EXTREMITY VENOGRAPHY Right 08/10/2021  ? Procedure: UPPER EXTREMITY VENOGRAPHY;  Surgeon: Serafina Mitchell, MD;  Location: [place] [facility];  Service: Cardiovascular;  Laterality: Right;  ? ?Patient Active Problem List  ? Diagnosis Date Noted  ? Chest pain 11/07/2021  ? Hypertensive urgency 11/07/2021  ? Swelling of right upper extremity 11/07/2021  ? S/P reverse total shoulder arthroplasty, right 10/27/2021  ? AV graft malfunction ([place]) 09/01/2021  ? Dialysis AV fistula malfunction, initial encounter ([place]) 08/30/2021  ? Mood disorder ([place]) 08/30/2021  ? Hypoglycemia 08/04/2021  ? Type 2 diabetes mellitus not at goal [facility]) 07/29/2021  ? Altered mental status 05/04/2021  ? S/P AKA (above knee amputation), left ([place]) 05/04/2021  ? Iron deficiency anemia, unspecified 04/13/2021  ? Sepsis ([place])   ? Anemia of chronic disease   ? Infection of prosthetic left knee joint ([place])   ? MRSA bacteremia 03/30/2021  ? Left knee pain   ? 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   ?  Fever 03/29/2021  ? ESRD (end stage renal disease) ([place]) 03/29/2021  ? COPD (chronic obstructive pulmonary disease) ([place]) 03/29/2021  ? Hypertension complicating diabetes ([place]) 03/29/2021  ? Type 2 diabetes mellitus with hyperlipidemia ([place]) 03/29/2021  ? PTSD (post-traumatic stress disorder) 03/29/2021  ? CHF (congestive heart failure) ([place]) 03/29/2021  ? Status post cervical spinal fusion 08/31/2020  ? Controlled substance agreement signed 08/30/2020  ? Secondary  hyperparathyroidism of renal origin ([place]) 08/15/2020  ? Cervical myelopathy ([place]) 07/03/2020  ? Bilateral bunions 05/08/2020  ? Hammer toes of both feet 05/08/2020  ? Onychomycosis of toenail 05/08/2020  ? Muscle cramps 05/07/2020  ? History of 2019 novel coronavirus disease (COVID-19) 11/06/2019  ? Cervical radiculopathy 10/28/2019  ? Volume overload 06/25/2019  ? Degenerative disc disease, cervical 04/02/2019  ? Spinal stenosis of lumbosacral region 08/28/2018  ? Primary osteoarthritis of both shoulders 02/12/2018  ? Rotator cuff arthropathy of both shoulders 02/12/2018  ? Lumbar degenerative disc disease 01/25/2018  ? Chronic midline low back pain without sciatica 12/15/2017  ? History of colon polyps 08/15/2016  ? Mild episode of recurrent major depressive disorder ([place]) 05/30/2016  ? Vitamin D deficiency 04/18/2016  ? Chronic insomnia 12/21/2015  ? Pulmonary hypertension, mild ([place]) 04/29/2014  ? Chronic gastritis without bleeding 01/13/2014  ? Diabetic polyneuropathy associated with type 2 diabetes mellitus ([place]) 01/13/2014  ? Chronic diastolic heart failure ([place]) 12/24/2013  ? OSA on CPAP 12/24/2013  ? Primary osteoarthritis of both knees 08/26/2013  ? Status post total bilateral knee replacement 05/02/2013  ? Mixed hyperlipidemia 07/30/2012  ? Class 3 severe obesity due to excess calories with serious comorbidity and body mass index (BMI) of 45.0 to 49.9 in adult [facility]) 07/27/2012  ? Gastroesophageal reflux disease without esophagitis 05/12/2008  ? Erectile dysfunction due to diseases classified elsewhere 01/19/2005  ? ? ?ONSET DATE: 01/20/2022 (referral date) ? ?REFERRING DIAG: L AKA ? ?THERAPY DIAG:  ?Unsteadiness on feet ? ?Muscle weakness (generalized) ? ?Other abnormalities of gait and mobility ? ?Abnormal posture ? ?SUBJECTIVE:  ? ?SUBJECTIVE STATEMENT: ?Pt presents s/p L AKA in May of 2022.  It took pt a long time to get leg and catch up with the doctor in order to get here to PT.   ?Pt accompanied by:  self ? ?PERTINENT HISTORY: COPD (chronic obstructive pulmonary disease) (CMS-HCC) 05/04/2021  ? DDD (degenerative disc disease), lumbosacral  ? Depression 09/28/2009  ? Diabetes mellitus type 2, uncomplicated (CMS-HCC)  ? Dialysis patient (CMS-HCC)  ? Family hx of colon cancer 08/26/2013  ? GERD (gastroesophageal reflux disease) 11/28/2008  ? Headache(784.0)  ? Heart disease 11/28/2010  ? Heart murmur, unspecified  ? History of myocardial infarction  ? History of pancreatitis 08/26/2013  ? Hyperlipemia 07/30/2012  ? Hyperlipidemia LDL goal < 100 07/30/2012  ? hypertension with heart failure(402.91) 07/27/2012  ? Kidney disease, stage 4  ? Lumbar degenerative disc disease  ? Lumbosacral radiculopathy at L5 03/27/2017  ? OSA on CPAP  ? Osteoarthrosis  ? ? ?PAIN:  ?Are you having pain? Yes: NPRS scale: 7/10 ?Pain location: Generalized and phantom pain, R ankle pain ?Pain description: achy ?Aggravating factors: working it out ?Relieving factors: rest  ? ? ?PRECAUTIONS: Fall (shoulder replacement 09/14/2021, R knee needs to be replaced per pt report) ? ?WEIGHT BEARING RESTRICTIONS No ? ?FALLS: Has patient fallen in last 6 months? Yes. Number of falls 2 ? ?LIVING ENVIRONMENT: ?Lives with: lives alone ?Lives in: House/apartment ?Home Access: Level entry (has one step at house in [place]) ?  Home layout: One level and Two level (20 steps upstairs to bedroom in [place]) ?Stairs: Yes: Internal: 206 steps; on left going up ?Has following equipment at home: Gilford Rile - 2 wheeled, Environmental consultant - 4 wheeled, Wheelchair (manual), and [employer] ? ?PLOF: Independent with household mobility with device, Independent with community mobility with device, and Independent with homemaking with wheelchair ? ?PATIENT GOALS I want to be able to run with this leg.  Likes to coach (all sports).   ? ?OBJECTIVE:  ? ?DIAGNOSTIC FINDINGS: n/a ? ?COGNITION: ?Overall cognitive status: Within functional limits for tasks assessed and He does report he has  multiple personalities during session.  ?  ?SENSATION: ?Light touch: WFL ? ? ?POSTURE: rounded shoulders, forward head, and posterior pelvic tilt ? ?LE ROM: ? ? ROM Right ?03/01/2022 Left ?03/01/2022  ?Hip flexion    ?Hip extensi

## 2022-03-17 ENCOUNTER — Ambulatory Visit: Payer: Medicare Other

## 2022-03-17 DIAGNOSIS — M6281 Muscle weakness (generalized): Secondary | ICD-10-CM

## 2022-03-17 DIAGNOSIS — R2681 Unsteadiness on feet: Secondary | ICD-10-CM

## 2022-03-17 DIAGNOSIS — R2689 Other abnormalities of gait and mobility: Secondary | ICD-10-CM

## 2022-03-17 DIAGNOSIS — Z96651 Presence of right artificial knee joint: Secondary | ICD-10-CM | POA: Diagnosis not present

## 2022-03-17 NOTE — Therapy (Signed)
?OUTPATIENT PHYSICAL THERAPY TREATMENT NOTE ? ? ?Patient Name: Jeffrey [name] ?MRN: [id number] ?DOB:03-12-1961, 61 y.o., male ?Today's Date: 03/17/2022 ? ?PCP: Pcp, No ?REFERRING PROVIDER: Wilder Glade, MD ? ?END OF SESSION:  ? PT End of Session - 03/17/22 1619   ? ? Visit Number 2   ? Number of Visits 25   ? Date for PT Re-Evaluation 05/30/22   ? Authorization Type Medicare and Medicaid (needs 10th visit progress note)   ? PT Start Time 1616   ? PT Stop Time 1655   ? PT Time Calculation (min) 39 min   ? Equipment Utilized During Treatment Other (comment)   L prosthesis  ? Activity Tolerance Patient limited by pain;Patient limited by fatigue   ? Behavior During Therapy [facility] for tasks assessed/performed   ? ?  ?  ? ?  ? ? ?Past Medical History:  ?Diagnosis Date  ? Arthritis   ? Blood transfusion without reported diagnosis   ? CHF (congestive heart failure) ([place])   ? COPD (chronic obstructive pulmonary disease) ([place])   ? Diabetes mellitus without complication ([place])   ? Hypertension   ? PTSD (post-traumatic stress disorder)   ? Renal disorder   ? Stage 5 Kidney Failure  ? ?Past Surgical History:  ?Procedure Laterality Date  ? A/V FISTULAGRAM N/A 08/30/2021  ? Procedure: A/V FISTULAGRAM;  Surgeon: Waynetta Sandy, MD;  Location: [place] [facility];  Service: Cardiovascular;  Laterality: N/A;  ? A/V SHUNTOGRAM N/A 11/08/2021  ? Procedure: A/V SHUNTOGRAM;  Surgeon: Waynetta Sandy, MD;  Location: [place] [facility];  Service: Cardiovascular;  Laterality: N/A;  ? AMPUTATION Left 04/03/2021  ? Procedure: LEFT ABOVE-THE-KNEE AMPUTATION;  Surgeon: Erle Crocker, MD;  Location: [place];  Service: Orthopedics;  Laterality: Left;  ? AV FISTULA PLACEMENT Right 08/12/2021  ? Procedure: INSERTION OF ARTERIOVENOUS (AV) GORE-TEX GRAFT RIGHT ARM;  Surgeon: Serafina Mitchell, MD;  [facility];  Service: Vascular;  Laterality: Right;  ? IR REMOVAL TUN CV CATH W/O FL  03/30/2021  ? JOINT REPLACEMENT    ?  Bilateral knees  ? PERIPHERAL VASCULAR BALLOON ANGIOPLASTY Right 08/30/2021  ? Procedure: PERIPHERAL VASCULAR BALLOON ANGIOPLASTY;  Surgeon: Waynetta Sandy, MD;  Location: [place] [facility];  Service: Cardiovascular;  Laterality: Right;  ? PERIPHERAL VASCULAR INTERVENTION  11/08/2021  ? Procedure: PERIPHERAL VASCULAR INTERVENTION;  Surgeon: Waynetta Sandy, MD;  Location: [place] [facility];  Service: Cardiovascular;;  Central rt arm fistula  ? UPPER EXTREMITY VENOGRAPHY Right 08/10/2021  ? Procedure: UPPER EXTREMITY VENOGRAPHY;  Surgeon: Serafina Mitchell, MD;  Location: [place] [facility];  Service: Cardiovascular;  Laterality: Right;  ? ?Patient Active Problem List  ? Diagnosis Date Noted  ? Chest pain 11/07/2021  ? Hypertensive urgency 11/07/2021  ? Swelling of right upper extremity 11/07/2021  ? S/P reverse total shoulder arthroplasty, right 10/27/2021  ? AV graft malfunction ([place]) 09/01/2021  ? Dialysis AV fistula malfunction, initial encounter ([place]) 08/30/2021  ? Mood disorder ([place]) 08/30/2021  ? Hypoglycemia 08/04/2021  ? Type 2 diabetes mellitus not at goal [facility]) 07/29/2021  ? Altered mental status 05/04/2021  ? S/P AKA (above knee amputation), left ([place]) 05/04/2021  ? Iron deficiency anemia, unspecified 04/13/2021  ? Sepsis ([place])   ? Anemia of chronic disease   ? Infection of prosthetic left knee joint ([place])   ? MRSA bacteremia 03/30/2021  ? Left knee pain   ? 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   ?  Fever 03/29/2021  ? ESRD (end stage renal disease) ([place]) 03/29/2021  ? COPD (chronic obstructive pulmonary disease) ([place]) 03/29/2021  ? Hypertension complicating diabetes ([place]) 03/29/2021  ? Type 2 diabetes mellitus with hyperlipidemia ([place]) 03/29/2021  ? PTSD (post-traumatic stress disorder) 03/29/2021  ? CHF (congestive heart failure) ([place]) 03/29/2021  ? Status post cervical spinal fusion 08/31/2020  ? Controlled substance agreement signed 08/30/2020  ? Secondary  hyperparathyroidism of renal origin ([place]) 08/15/2020  ? Cervical myelopathy ([place]) 07/03/2020  ? Bilateral bunions 05/08/2020  ? Hammer toes of both feet 05/08/2020  ? Onychomycosis of toenail 05/08/2020  ? Muscle cramps 05/07/2020  ? History of 2019 novel coronavirus disease (COVID-19) 11/06/2019  ? Cervical radiculopathy 10/28/2019  ? Volume overload 06/25/2019  ? Degenerative disc disease, cervical 04/02/2019  ? Spinal stenosis of lumbosacral region 08/28/2018  ? Primary osteoarthritis of both shoulders 02/12/2018  ? Rotator cuff arthropathy of both shoulders 02/12/2018  ? Lumbar degenerative disc disease 01/25/2018  ? Chronic midline low back pain without sciatica 12/15/2017  ? History of colon polyps 08/15/2016  ? Mild episode of recurrent major depressive disorder ([place]) 05/30/2016  ? Vitamin D deficiency 04/18/2016  ? Chronic insomnia 12/21/2015  ? Pulmonary hypertension, mild ([place]) 04/29/2014  ? Chronic gastritis without bleeding 01/13/2014  ? Diabetic polyneuropathy associated with type 2 diabetes mellitus ([place]) 01/13/2014  ? Chronic diastolic heart failure ([place]) 12/24/2013  ? OSA on CPAP 12/24/2013  ? Primary osteoarthritis of both knees 08/26/2013  ? Status post total bilateral knee replacement 05/02/2013  ? Mixed hyperlipidemia 07/30/2012  ? Class 3 severe obesity due to excess calories with serious comorbidity and body mass index (BMI) of 45.0 to 49.9 in adult [facility]) 07/27/2012  ? Gastroesophageal reflux disease without esophagitis 05/12/2008  ? Erectile dysfunction due to diseases classified elsewhere 01/19/2005  ? ? ?REFERRING DIAG: L AKA ? ?THERAPY DIAG:  ?Other abnormalities of gait and mobility ? ?Muscle weakness (generalized) ? ?Unsteadiness on feet ? ?PERTINENT HISTORY: COPD (chronic obstructive pulmonary disease) (CMS-HCC) 05/04/2021  ? DDD (degenerative disc disease), lumbosacral  ? Depression 09/28/2009  ? Diabetes mellitus type 2, uncomplicated (CMS-HCC)  ? Dialysis patient (CMS-HCC)  ? Family  hx of colon cancer 08/26/2013  ? GERD (gastroesophageal reflux disease) 11/28/2008  ? Headache(784.0)  ? Heart disease 11/28/2010  ? Heart murmur, unspecified  ? History of myocardial infarction  ? History of pancreatitis 08/26/2013  ? Hyperlipemia 07/30/2012  ? Hyperlipidemia LDL goal < 100 07/30/2012  ? hypertension with heart failure(402.91) 07/27/2012  ? Kidney disease, stage 4  ? Lumbar degenerative disc disease  ? Lumbosacral radiculopathy at L5 03/27/2017  ? OSA on CPAP  ?-OA ? ?PRECAUTIONS: Fall (shoulder replacement 09/14/2021, R knee needs to be replaced per pt report) ? ?SUBJECTIVE: Pt saw Gerald Stabs at [facility] this morning at 10:15. Has had liner on since then. Reports Gerald Stabs made some adjustments to the locking mechanism and the length. Advised that laying down was easiest position to get liner on. ? ?PAIN:  ?Are you having pain? Yes: NPRS scale: 8/10 ?Pain location: shoulders ?Pain description: ache ?Aggravating factors: arthritis, recent right shoulder replacement ?Relieving factors: rest, medication ? ? ?TODAY'S TREATMENT:  ?  ?Prosthetics: ?Prosthetic care comments: PT added 3 ply sock as pt was having pain when stood up on prosthesis in groin. Pt still unable to donn liner or shrinker on own. Has been wearing liner for last 6 hours since prosthetist put on. Discussed seeing if daughter could perhaps donn at least 1x/day for  him to work on wear time. Pt states he is trying to get an aide. Discussed needing to be able to work on gradually increasing wear time and sweat management. ?Donning prosthesis: Total A and able to donn prosthesis but total assist to donn liner. ?Doffing prosthesis: SBA ?Prosthetic wear tolerance: Currently unable to donn liner on own. ?Prosthetic weight bearing tolerance: 1 minutes ?Residual limb condition: checked skin at end of session when removed liner and was intact. Was sweating some so patted dry with towel. Assisted to donn shrinker as pt unable to donn and was challenging  for PT as well. Reports he does have a looser old shrinker he can sometimes get on.  ? ?Pt stood from walker CGA/min assist with heavy UE support. Upon initial standing pt reporting pain in groin so had him sit an

## 2022-03-22 ENCOUNTER — Ambulatory Visit: Payer: Medicare Other

## 2022-03-29 ENCOUNTER — Ambulatory Visit: Payer: Medicare Other | Admitting: Physical Therapy

## 2022-03-31 ENCOUNTER — Ambulatory Visit: Payer: Medicare Other

## 2022-04-05 ENCOUNTER — Encounter: Payer: Self-pay | Admitting: Rehabilitation

## 2022-04-05 ENCOUNTER — Ambulatory Visit
Payer: Medicare Other | Attending: Student in an Organized Health Care Education/Training Program | Admitting: Rehabilitation

## 2022-04-05 DIAGNOSIS — Z89612 Acquired absence of left leg above knee: Secondary | ICD-10-CM | POA: Insufficient documentation

## 2022-04-05 DIAGNOSIS — M6281 Muscle weakness (generalized): Secondary | ICD-10-CM | POA: Insufficient documentation

## 2022-04-05 DIAGNOSIS — M5136 Other intervertebral disc degeneration, lumbar region: Secondary | ICD-10-CM | POA: Diagnosis present

## 2022-04-05 DIAGNOSIS — M1711 Unilateral primary osteoarthritis, right knee: Secondary | ICD-10-CM | POA: Diagnosis not present

## 2022-04-05 DIAGNOSIS — R2689 Other abnormalities of gait and mobility: Secondary | ICD-10-CM | POA: Insufficient documentation

## 2022-04-05 DIAGNOSIS — G8929 Other chronic pain: Secondary | ICD-10-CM | POA: Insufficient documentation

## 2022-04-05 DIAGNOSIS — M19011 Primary osteoarthritis, right shoulder: Secondary | ICD-10-CM | POA: Diagnosis not present

## 2022-04-05 DIAGNOSIS — M19012 Primary osteoarthritis, left shoulder: Secondary | ICD-10-CM | POA: Insufficient documentation

## 2022-04-05 DIAGNOSIS — M5030 Other cervical disc degeneration, unspecified cervical region: Secondary | ICD-10-CM | POA: Insufficient documentation

## 2022-04-05 DIAGNOSIS — R2681 Unsteadiness on feet: Secondary | ICD-10-CM | POA: Insufficient documentation

## 2022-04-05 DIAGNOSIS — R293 Abnormal posture: Secondary | ICD-10-CM | POA: Insufficient documentation

## 2022-04-05 NOTE — Therapy (Signed)
?OUTPATIENT PHYSICAL THERAPY TREATMENT NOTE ? ? ?Patient Name: Jeffrey [name] ?MRN: [id number] ?DOB:November 24, 1961, 61 y.o., male ?Today's Date: 04/05/2022 ? ?PCP: Pcp, No ?REFERRING PROVIDER: Wilder Glade, MD ? ?END OF SESSION:  ? PT End of Session - 04/05/22 1532   ? ? Visit Number 3   ? Number of Visits 25   ? Date for PT Re-Evaluation 05/30/22   ? Authorization Type Medicare and Medicaid (needs 10th visit progress note)   ? PT Start Time 5188   ? PT Stop Time 1530   ? PT Time Calculation (min) 45 min   ? Equipment Utilized During Treatment Other (comment)   L prosthesis  ? Activity Tolerance Patient limited by pain;Patient limited by fatigue   ? Behavior During Therapy [facility] for tasks assessed/performed   ? ?  ?  ? ?  ? ? ?Past Medical History:  ?Diagnosis Date  ? Arthritis   ? Blood transfusion without reported diagnosis   ? CHF (congestive heart failure) ([place])   ? COPD (chronic obstructive pulmonary disease) ([place])   ? Diabetes mellitus without complication ([place])   ? Hypertension   ? PTSD (post-traumatic stress disorder)   ? Renal disorder   ? Stage 5 Kidney Failure  ? ?Past Surgical History:  ?Procedure Laterality Date  ? A/V FISTULAGRAM N/A 08/30/2021  ? Procedure: A/V FISTULAGRAM;  Surgeon: Waynetta Sandy, MD;  Location: [place] [facility];  Service: Cardiovascular;  Laterality: N/A;  ? A/V SHUNTOGRAM N/A 11/08/2021  ? Procedure: A/V SHUNTOGRAM;  Surgeon: Waynetta Sandy, MD;  Location: [place] [facility];  Service: Cardiovascular;  Laterality: N/A;  ? AMPUTATION Left 04/03/2021  ? Procedure: LEFT ABOVE-THE-KNEE AMPUTATION;  Surgeon: Erle Crocker, MD;  Location: [place];  Service: Orthopedics;  Laterality: Left;  ? AV FISTULA PLACEMENT Right 08/12/2021  ? Procedure: INSERTION OF ARTERIOVENOUS (AV) GORE-TEX GRAFT RIGHT ARM;  Surgeon: Serafina Mitchell, MD;  [facility];  Service: Vascular;  Laterality: Right;  ? IR REMOVAL TUN CV CATH W/O FL  03/30/2021  ? JOINT REPLACEMENT    ?  Bilateral knees  ? PERIPHERAL VASCULAR BALLOON ANGIOPLASTY Right 08/30/2021  ? Procedure: PERIPHERAL VASCULAR BALLOON ANGIOPLASTY;  Surgeon: Waynetta Sandy, MD;  Location: [place] [facility];  Service: Cardiovascular;  Laterality: Right;  ? PERIPHERAL VASCULAR INTERVENTION  11/08/2021  ? Procedure: PERIPHERAL VASCULAR INTERVENTION;  Surgeon: Waynetta Sandy, MD;  Location: [place] [facility];  Service: Cardiovascular;;  Central rt arm fistula  ? UPPER EXTREMITY VENOGRAPHY Right 08/10/2021  ? Procedure: UPPER EXTREMITY VENOGRAPHY;  Surgeon: Serafina Mitchell, MD;  Location: [place] [facility];  Service: Cardiovascular;  Laterality: Right;  ? ?Patient Active Problem List  ? Diagnosis Date Noted  ? Chest pain 11/07/2021  ? Hypertensive urgency 11/07/2021  ? Swelling of right upper extremity 11/07/2021  ? S/P reverse total shoulder arthroplasty, right 10/27/2021  ? AV graft malfunction ([place]) 09/01/2021  ? Dialysis AV fistula malfunction, initial encounter ([place]) 08/30/2021  ? Mood disorder ([place]) 08/30/2021  ? Hypoglycemia 08/04/2021  ? Type 2 diabetes mellitus not at goal [facility]) 07/29/2021  ? Altered mental status 05/04/2021  ? S/P AKA (above knee amputation), left ([place]) 05/04/2021  ? Iron deficiency anemia, unspecified 04/13/2021  ? Sepsis ([place])   ? Anemia of chronic disease   ? Infection of prosthetic left knee joint ([place])   ? MRSA bacteremia 03/30/2021  ? Left knee pain   ? 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   ?  Fever 03/29/2021  ? ESRD (end stage renal disease) ([place]) 03/29/2021  ? COPD (chronic obstructive pulmonary disease) ([place]) 03/29/2021  ? Hypertension complicating diabetes ([place]) 03/29/2021  ? Type 2 diabetes mellitus with hyperlipidemia ([place]) 03/29/2021  ? PTSD (post-traumatic stress disorder) 03/29/2021  ? CHF (congestive heart failure) ([place]) 03/29/2021  ? Status post cervical spinal fusion 08/31/2020  ? Controlled substance agreement signed 08/30/2020  ? Secondary  hyperparathyroidism of renal origin ([place]) 08/15/2020  ? Cervical myelopathy ([place]) 07/03/2020  ? Bilateral bunions 05/08/2020  ? Hammer toes of both feet 05/08/2020  ? Onychomycosis of toenail 05/08/2020  ? Muscle cramps 05/07/2020  ? History of 2019 novel coronavirus disease (COVID-19) 11/06/2019  ? Cervical radiculopathy 10/28/2019  ? Volume overload 06/25/2019  ? Degenerative disc disease, cervical 04/02/2019  ? Spinal stenosis of lumbosacral region 08/28/2018  ? Primary osteoarthritis of both shoulders 02/12/2018  ? Rotator cuff arthropathy of both shoulders 02/12/2018  ? Lumbar degenerative disc disease 01/25/2018  ? Chronic midline low back pain without sciatica 12/15/2017  ? History of colon polyps 08/15/2016  ? Mild episode of recurrent major depressive disorder ([place]) 05/30/2016  ? Vitamin D deficiency 04/18/2016  ? Chronic insomnia 12/21/2015  ? Pulmonary hypertension, mild ([place]) 04/29/2014  ? Chronic gastritis without bleeding 01/13/2014  ? Diabetic polyneuropathy associated with type 2 diabetes mellitus ([place]) 01/13/2014  ? Chronic diastolic heart failure ([place]) 12/24/2013  ? OSA on CPAP 12/24/2013  ? Primary osteoarthritis of both knees 08/26/2013  ? Status post total bilateral knee replacement 05/02/2013  ? Mixed hyperlipidemia 07/30/2012  ? Class 3 severe obesity due to excess calories with serious comorbidity and body mass index (BMI) of 45.0 to 49.9 in adult [facility]) 07/27/2012  ? Gastroesophageal reflux disease without esophagitis 05/12/2008  ? Erectile dysfunction due to diseases classified elsewhere 01/19/2005  ? ? ?REFERRING DIAG: L AKA ? ?THERAPY DIAG:  ?Other abnormalities of gait and mobility ? ?Muscle weakness (generalized) ? ?Unsteadiness on feet ? ?Abnormal posture ? ?PERTINENT HISTORY: COPD (chronic obstructive pulmonary disease) (CMS-HCC) 05/04/2021  ? DDD (degenerative disc disease), lumbosacral  ? Depression 09/28/2009  ? Diabetes mellitus type 2, uncomplicated (CMS-HCC)  ? Dialysis patient  (CMS-HCC)  ? Family hx of colon cancer 08/26/2013  ? GERD (gastroesophageal reflux disease) 11/28/2008  ? Headache(784.0)  ? Heart disease 11/28/2010  ? Heart murmur, unspecified  ? History of myocardial infarction  ? History of pancreatitis 08/26/2013  ? Hyperlipemia 07/30/2012  ? Hyperlipidemia LDL goal < 100 07/30/2012  ? hypertension with heart failure(402.91) 07/27/2012  ? Kidney disease, stage 4  ? Lumbar degenerative disc disease  ? Lumbosacral radiculopathy at L5 03/27/2017  ? OSA on CPAP  ?-OA ? ?PRECAUTIONS: Fall (shoulder replacement 09/14/2021, R knee needs to be replaced per pt report) ? ?SUBJECTIVE: Pt saw Gerald Stabs at [place] this morning at 10:15. Has had liner on since then. Reports Gerald Stabs made some adjustments to the locking mechanism and the length. Advised that laying down was easiest position to get liner on. ? ?PAIN:  ?Are you having pain? Yes: NPRS scale: 8/10 ?Pain location: shoulders ?Pain description: ache ?Aggravating factors: arthritis, recent right shoulder replacement ?Relieving factors: rest, medication ? ? ?TODAY'S TREATMENT:  ?  ?Prosthetics: ?Prosthetic care comments: PT added 3 ply sock prior to standing as last session he shrank quickly. Pt still unable to donn liner or shrinker on own. Continue to educate on the importance of him increasing wear time of liner and prosthesis and at least trying to stand at home.  He is very resistant to wearing on a consistent basis and wants to do "it on my own pace." Donning prosthesis: Total A and able to donn prosthesis but total assist to donn socks. ?Doffing prosthesis: SBA ?Prosthetic wear tolerance: Currently unable to donn liner on own. ?Prosthetic weight bearing tolerance: 5 mins  ?Residual limb condition: Pt reports skin is intact, did not check today since he had liner donned.  ?Pt stood from walker CGA/min assist with heavy UE support. Pt initially needing min A but fading to S needing momentum to get into standing.  He continues to do well  locking knee once in standing and unlocking when sitting.  ? ?GAIT: ?Gait pattern: step to pattern, decreased step length- Right, and decreased stance time- Left ?Distance walked: 24', 35', 42', 16' ?Assistive d

## 2022-04-12 ENCOUNTER — Encounter: Payer: Self-pay | Admitting: Rehabilitation

## 2022-04-12 ENCOUNTER — Ambulatory Visit: Payer: Medicare Other | Admitting: Rehabilitation

## 2022-04-12 DIAGNOSIS — M5136 Other intervertebral disc degeneration, lumbar region: Secondary | ICD-10-CM | POA: Diagnosis not present

## 2022-04-12 DIAGNOSIS — R293 Abnormal posture: Secondary | ICD-10-CM

## 2022-04-12 DIAGNOSIS — M6281 Muscle weakness (generalized): Secondary | ICD-10-CM

## 2022-04-12 DIAGNOSIS — R2681 Unsteadiness on feet: Secondary | ICD-10-CM

## 2022-04-12 DIAGNOSIS — R2689 Other abnormalities of gait and mobility: Secondary | ICD-10-CM

## 2022-04-12 NOTE — Therapy (Signed)
?OUTPATIENT PHYSICAL THERAPY TREATMENT NOTE ? ? ?Patient Name: Jeffrey [name] ?MRN: [id number] ?DOB:1961-10-19, 61 y.o., male ?Today's Date: 04/12/2022 ? ?PCP: Pcp, No ?REFERRING PROVIDER: Wilder Glade, MD ? ?END OF SESSION:  ? PT End of Session - 04/12/22 1453   ? ? Visit Number 4   ? Number of Visits 25   ? Date for PT Re-Evaluation 05/30/22   ? Authorization Type Medicare and Medicaid (needs 10th visit progress note)   ? PT Start Time 2130   ? PT Stop Time 1530   ? PT Time Calculation (min) 43 min   ? Equipment Utilized During Treatment Other (comment)   L prosthesis  ? Activity Tolerance Patient limited by pain;Patient limited by fatigue   ? Behavior During Therapy [facility] for tasks assessed/performed   ? ?  ?  ? ?  ? ? ?Past Medical History:  ?Diagnosis Date  ? Arthritis   ? Blood transfusion without reported diagnosis   ? CHF (congestive heart failure) ([place])   ? COPD (chronic obstructive pulmonary disease) ([place])   ? Diabetes mellitus without complication ([place])   ? Hypertension   ? PTSD (post-traumatic stress disorder)   ? Renal disorder   ? Stage 5 Kidney Failure  ? ?Past Surgical History:  ?Procedure Laterality Date  ? A/V FISTULAGRAM N/A 08/30/2021  ? Procedure: A/V FISTULAGRAM;  Surgeon: Waynetta Sandy, MD;  Location: [place] [facility];  Service: Cardiovascular;  Laterality: N/A;  ? A/V SHUNTOGRAM N/A 11/08/2021  ? Procedure: A/V SHUNTOGRAM;  Surgeon: Waynetta Sandy, MD;  Location: [place] [facility];  Service: Cardiovascular;  Laterality: N/A;  ? AMPUTATION Left 04/03/2021  ? Procedure: LEFT ABOVE-THE-KNEE AMPUTATION;  Surgeon: Erle Crocker, MD;  Location: [place];  Service: Orthopedics;  Laterality: Left;  ? AV FISTULA PLACEMENT Right 08/12/2021  ? Procedure: INSERTION OF ARTERIOVENOUS (AV) GORE-TEX GRAFT RIGHT ARM;  Surgeon: Serafina Mitchell, MD;  [facility];  Service: Vascular;  Laterality: Right;  ? IR REMOVAL TUN CV CATH W/O FL  03/30/2021  ? JOINT REPLACEMENT    ?  Bilateral knees  ? PERIPHERAL VASCULAR BALLOON ANGIOPLASTY Right 08/30/2021  ? Procedure: PERIPHERAL VASCULAR BALLOON ANGIOPLASTY;  Surgeon: Waynetta Sandy, MD;  Location: [place] [facility];  Service: Cardiovascular;  Laterality: Right;  ? PERIPHERAL VASCULAR INTERVENTION  11/08/2021  ? Procedure: PERIPHERAL VASCULAR INTERVENTION;  Surgeon: Waynetta Sandy, MD;  Location: [place] [facility];  Service: Cardiovascular;;  Central rt arm fistula  ? UPPER EXTREMITY VENOGRAPHY Right 08/10/2021  ? Procedure: UPPER EXTREMITY VENOGRAPHY;  Surgeon: Serafina Mitchell, MD;  Location: [place] [facility];  Service: Cardiovascular;  Laterality: Right;  ? ?Patient Active Problem List  ? Diagnosis Date Noted  ? Chest pain 11/07/2021  ? Hypertensive urgency 11/07/2021  ? Swelling of right upper extremity 11/07/2021  ? S/P reverse total shoulder arthroplasty, right 10/27/2021  ? AV graft malfunction ([place]) 09/01/2021  ? Dialysis AV fistula malfunction, initial encounter ([place]) 08/30/2021  ? Mood disorder ([place]) 08/30/2021  ? Hypoglycemia 08/04/2021  ? Type 2 diabetes mellitus not at goal [facility]) 07/29/2021  ? Altered mental status 05/04/2021  ? S/P AKA (above knee amputation), left ([place]) 05/04/2021  ? Iron deficiency anemia, unspecified 04/13/2021  ? Sepsis ([place])   ? Anemia of chronic disease   ? Infection of prosthetic left knee joint ([place])   ? MRSA bacteremia 03/30/2021  ? Left knee pain   ? 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   ?  Fever 03/29/2021  ? ESRD (end stage renal disease) ([place]) 03/29/2021  ? COPD (chronic obstructive pulmonary disease) ([place]) 03/29/2021  ? Hypertension complicating diabetes ([place]) 03/29/2021  ? Type 2 diabetes mellitus with hyperlipidemia ([place]) 03/29/2021  ? PTSD (post-traumatic stress disorder) 03/29/2021  ? CHF (congestive heart failure) ([place]) 03/29/2021  ? Status post cervical spinal fusion 08/31/2020  ? Controlled substance agreement signed 08/30/2020  ? Secondary  hyperparathyroidism of renal origin ([place]) 08/15/2020  ? Cervical myelopathy ([place]) 07/03/2020  ? Bilateral bunions 05/08/2020  ? Hammer toes of both feet 05/08/2020  ? Onychomycosis of toenail 05/08/2020  ? Muscle cramps 05/07/2020  ? History of 2019 novel coronavirus disease (COVID-19) 11/06/2019  ? Cervical radiculopathy 10/28/2019  ? Volume overload 06/25/2019  ? Degenerative disc disease, cervical 04/02/2019  ? Spinal stenosis of lumbosacral region 08/28/2018  ? Primary osteoarthritis of both shoulders 02/12/2018  ? Rotator cuff arthropathy of both shoulders 02/12/2018  ? Lumbar degenerative disc disease 01/25/2018  ? Chronic midline low back pain without sciatica 12/15/2017  ? History of colon polyps 08/15/2016  ? Mild episode of recurrent major depressive disorder ([place]) 05/30/2016  ? Vitamin D deficiency 04/18/2016  ? Chronic insomnia 12/21/2015  ? Pulmonary hypertension, mild ([place]) 04/29/2014  ? Chronic gastritis without bleeding 01/13/2014  ? Diabetic polyneuropathy associated with type 2 diabetes mellitus ([place]) 01/13/2014  ? Chronic diastolic heart failure ([place]) 12/24/2013  ? OSA on CPAP 12/24/2013  ? Primary osteoarthritis of both knees 08/26/2013  ? Status post total bilateral knee replacement 05/02/2013  ? Mixed hyperlipidemia 07/30/2012  ? Class 3 severe obesity due to excess calories with serious comorbidity and body mass index (BMI) of 45.0 to 49.9 in adult [facility]) 07/27/2012  ? Gastroesophageal reflux disease without esophagitis 05/12/2008  ? Erectile dysfunction due to diseases classified elsewhere 01/19/2005  ? ? ?REFERRING DIAG: L AKA ? ?THERAPY DIAG:  ?Other abnormalities of gait and mobility ? ?Muscle weakness (generalized) ? ?Unsteadiness on feet ? ?Abnormal posture ? ?PERTINENT HISTORY: COPD (chronic obstructive pulmonary disease) (CMS-HCC) 05/04/2021  ? DDD (degenerative disc disease), lumbosacral  ? Depression 09/28/2009  ? Diabetes mellitus type 2, uncomplicated (CMS-HCC)  ? Dialysis patient  (CMS-HCC)  ? Family hx of colon cancer 08/26/2013  ? GERD (gastroesophageal reflux disease) 11/28/2008  ? Headache(784.0)  ? Heart disease 11/28/2010  ? Heart murmur, unspecified  ? History of myocardial infarction  ? History of pancreatitis 08/26/2013  ? Hyperlipemia 07/30/2012  ? Hyperlipidemia LDL goal < 100 07/30/2012  ? hypertension with heart failure(402.91) 07/27/2012  ? Kidney disease, stage 4  ? Lumbar degenerative disc disease  ? Lumbosacral radiculopathy at L5 03/27/2017  ? OSA on CPAP  ?-OA ? ?PRECAUTIONS: Fall (shoulder replacement 09/14/2021, R knee needs to be replaced per pt report) ? ?SUBJECTIVE: Pt reporting increased pain in R shoulder and R knee today.  This greatly impacted his ability to participate. He also reports that he has had leg on since 1030am and leg is getting sore.   ? ?PAIN:  ?Are you having pain? Yes: NPRS scale: 8/10 ?Pain location: Knee and shoulder ?Pain description: ache ?Aggravating factors: arthritis, recent right shoulder replacement ?Relieving factors: rest, medication ? ? ?TODAY'S TREATMENT:  ?  ?Prosthetics: ?Prosthetic care comments: Pt reports he has 5 ply sock on today and has had leg on since 1030 am.  PT had pt remove leg and liner at end of session to assess limb.  Note mild bruise and lower end of limb, but no open areas or  blisters.  Did note sweating, therefore wiped down with towel and provided education to do this at home and applying antiperspirant am/pm but allowing to dry before donning liner.  Provided max education on increasing wear time slowly and not all at once vs not wearing leg at all or all day (happy medium needed).  Pt verbalized understanding.  Donning prosthesis: Total A to don liner and socks, but he is able to don prosthesis at S level.  ?Doffing prosthesis: SBA ?Prosthetic wear tolerance: Currently unable to donn liner on own. ?Prosthetic weight bearing tolerance: 5 mins  ?Residual limb condition: Limb in good condition, sweating noted and  mild bruising.   ?Pt stood from walker CGA/min assist with heavy UE support. Pt initially needing min A but fading to S needing momentum to get into standing.  He continues to do well locking knee once in standing

## 2022-04-14 ENCOUNTER — Ambulatory Visit: Payer: Medicare Other | Admitting: Physical Therapy

## 2022-04-16 IMAGING — MR MR HEAD W/O CM
6 of 10 series · 29 of 48 positions shown · non-contrast
Comparison: Noncontrast head CT 08/13/2021

CLINICAL DATA: Neuro deficit with acute stroke suspected. Right
upper and right lower extremity weakness and numbness.

EXAM:
MRI HEAD WITHOUT CONTRAST
TECHNIQUE: Multiplanar, multiecho pulse sequences of the brain and surrounding
structures were obtained without intravenous contrast.

[Series 2: DWI · axial · 3.0mm · 0.94mm/px · z∈[-79,+72]mm · 9 of 101 slices shown (1 of 2)]
[im 1/101]
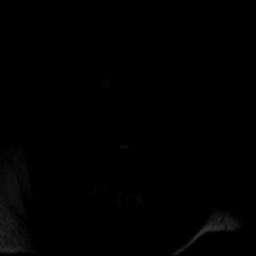
[im 13/101]
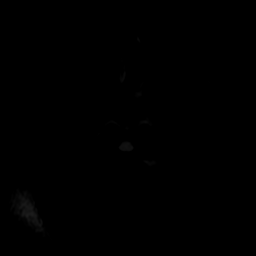
[im 26/101]
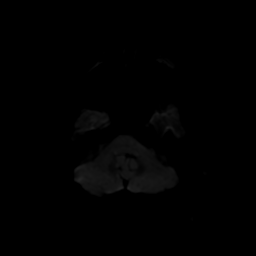
[im 38/101]
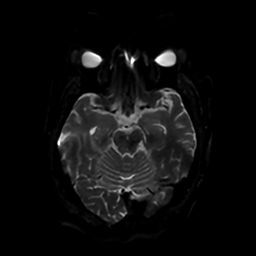
[im 51/101]
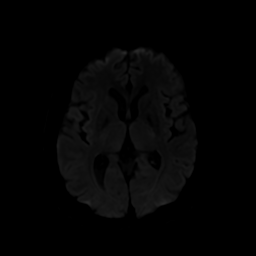
[im 63/101]
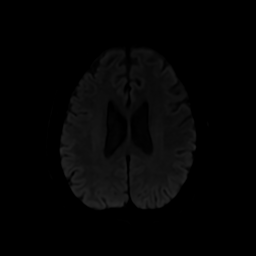
[im 76/101]
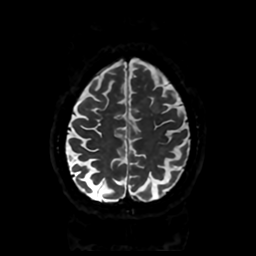
[im 88/101]
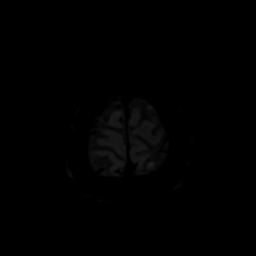
[im 101/101]
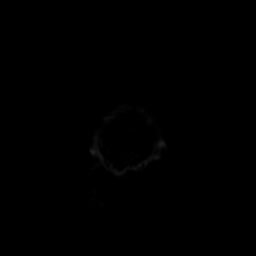

[Series 3: DWI · coronal · 4.0mm · 0.94mm/px · 7 of 74 slices shown (2 of 2)]
[im 1/74]
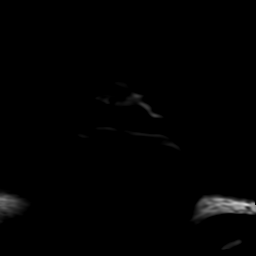
[im 13/74]
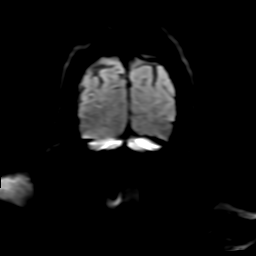
[im 25/74]
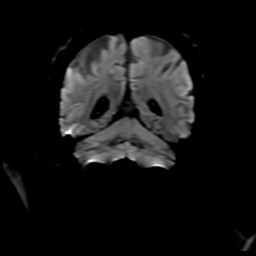
[im 37/74]
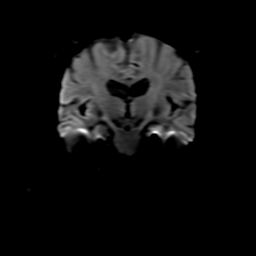
[im 49/74]
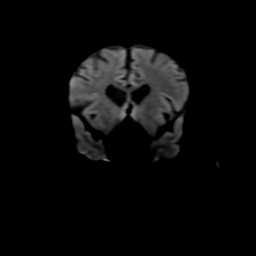
[im 61/74]
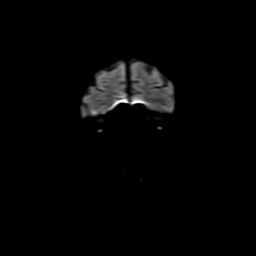
[im 74/74]
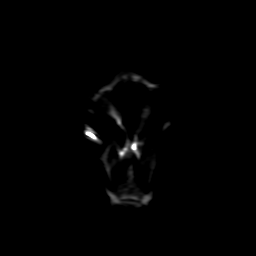

[Series 4: FLAIR · sagittal · 5.0mm · 0.23mm/px · 2 of 25 slices shown (1 of 2)]
[im 1/25]
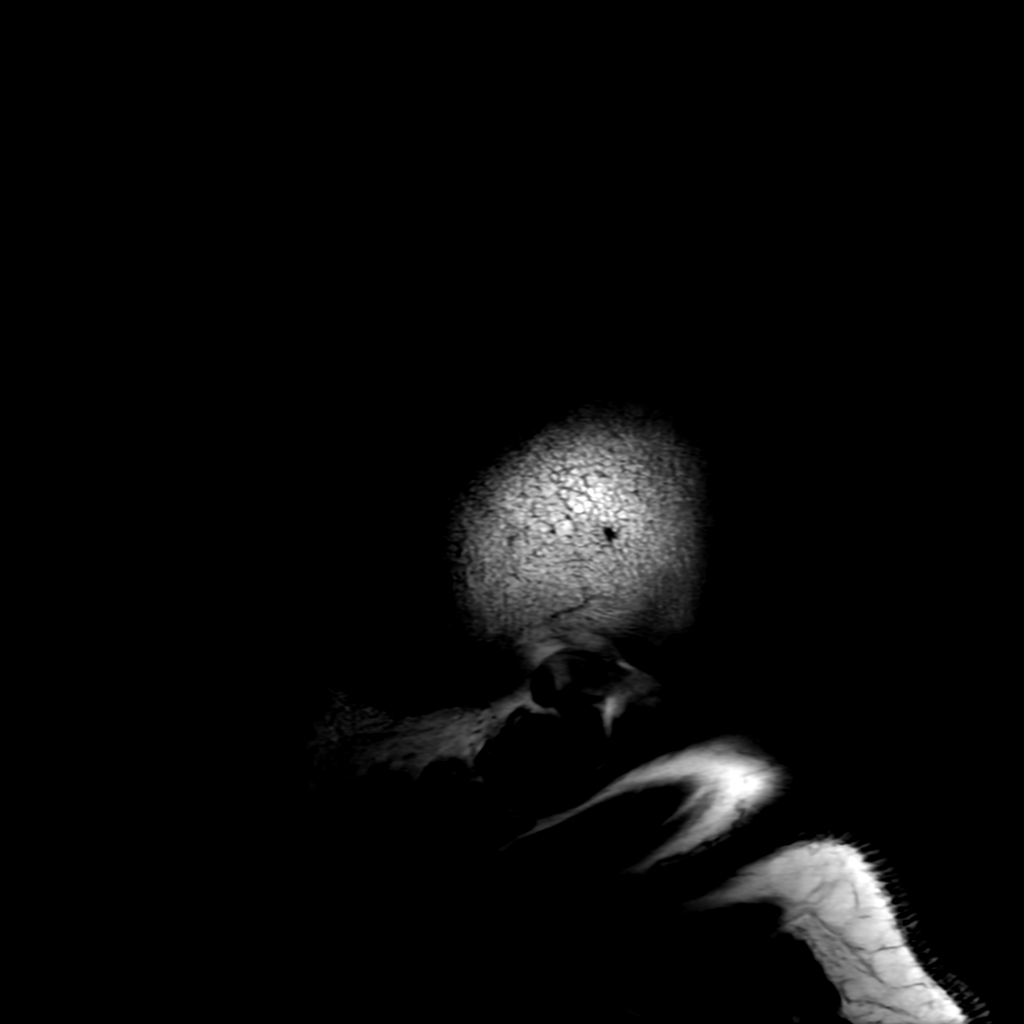
[im 25/25]
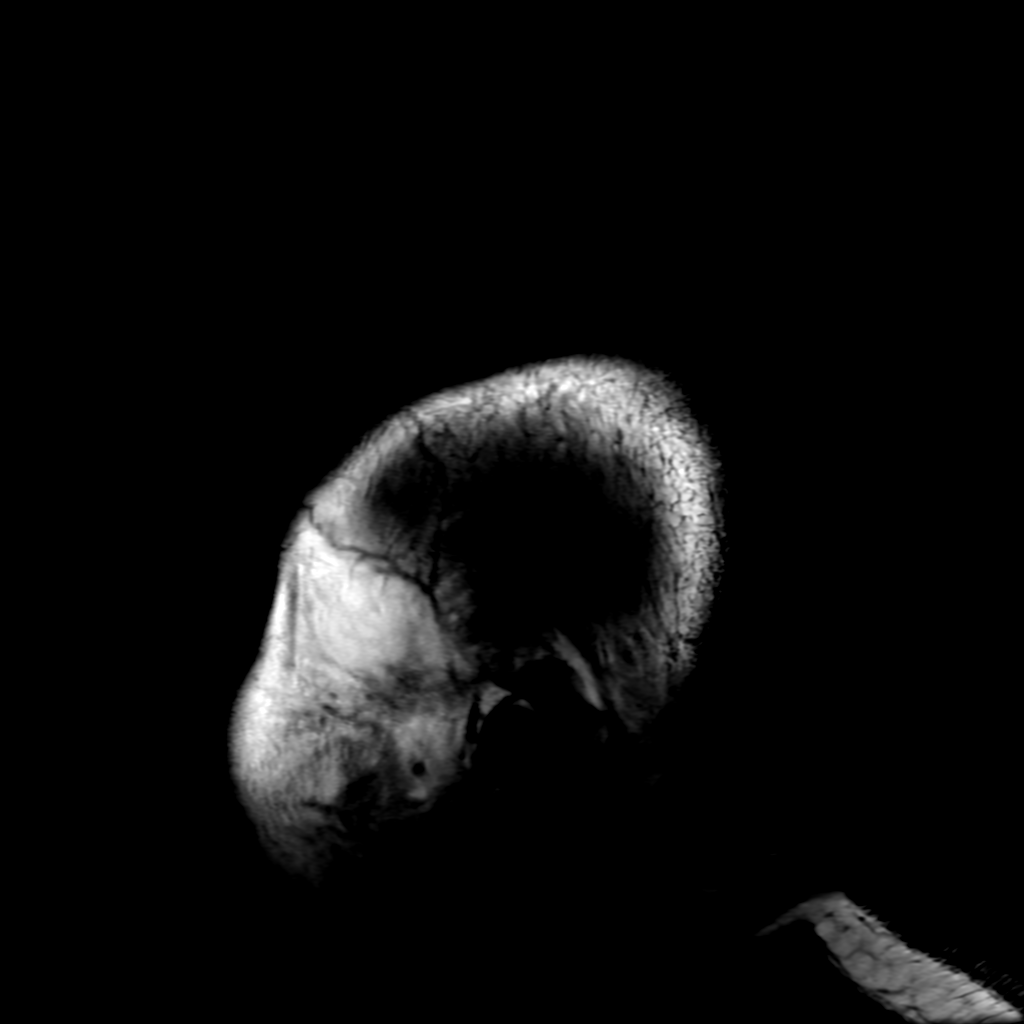

[Series 6: FLAIR · axial · 4.0mm · 0.45mm/px · z∈[-75,+72]mm · 3 of 35 slices shown (2 of 2)]
[im 1/35]
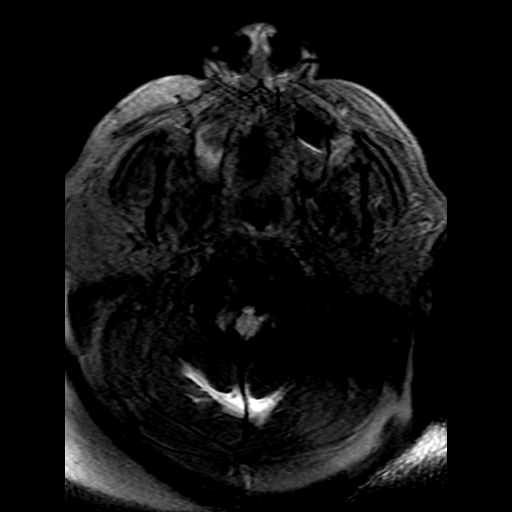
[im 18/35]
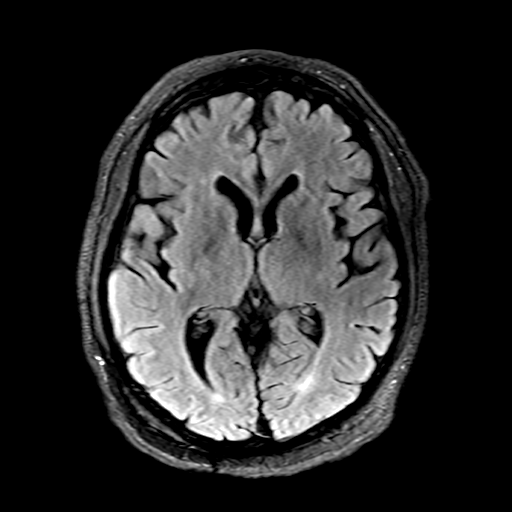
[im 35/35]
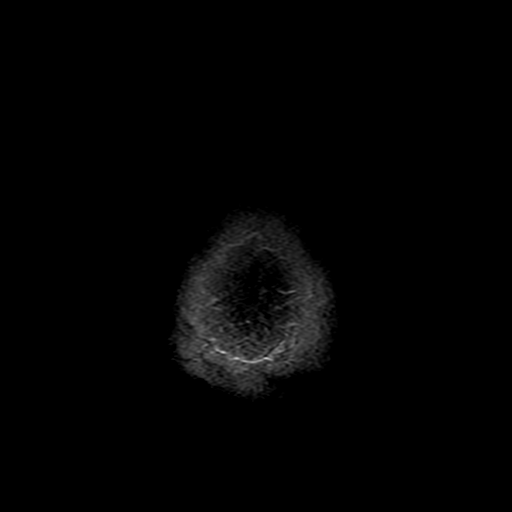

[Series 250: ADC · axial · 3.0mm · 0.94mm/px · z∈[-79,+72]mm · 5 of 52 slices shown (1 of 2)]
[im 1/52]
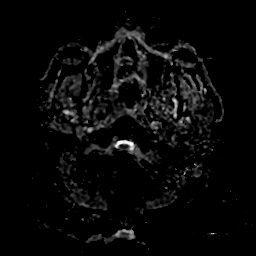
[im 13/52]
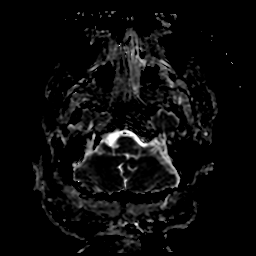
[im 26/52]
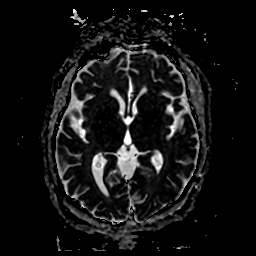
[im 39/52]
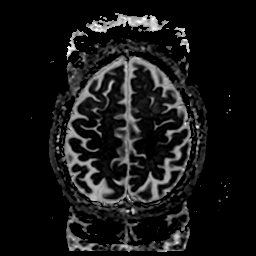
[im 52/52]
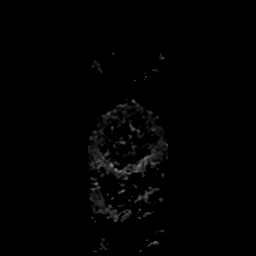

[Series 350: ADC · coronal · 4.0mm · 0.94mm/px · 3 of 37 slices shown (2 of 2)]
[im 1/37]
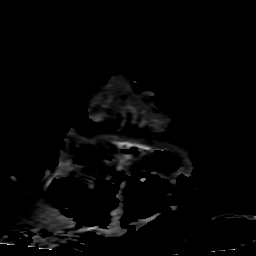
[im 19/37]
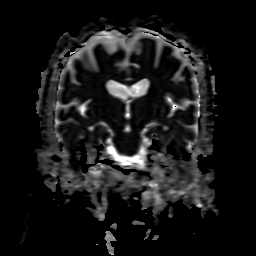
[im 37/37]
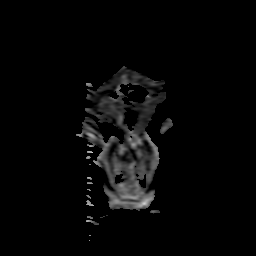

[29 of 48 positions shown; findings below may reference images not displayed]

FINDINGS: Brain: No acute infarction, hemorrhage, hydrocephalus, extra-axial
collection or mass lesion.

Vascular: Normal flow voids.

Skull and upper cervical spine: Limited coverage of postoperative
cervical spine with posterior soft tissue scarring. No marrow
lesion.

Sinuses/Orbits: Small volume secretions or retention cyst at the
lower left maxillary sinus.
IMPRESSION: Negative for infarct or other explanation for symptoms.

## 2022-04-19 ENCOUNTER — Ambulatory Visit: Payer: Medicare Other

## 2022-04-19 ENCOUNTER — Other Ambulatory Visit: Payer: Self-pay | Admitting: Internal Medicine

## 2022-04-19 DIAGNOSIS — R2689 Other abnormalities of gait and mobility: Secondary | ICD-10-CM

## 2022-04-19 DIAGNOSIS — M6281 Muscle weakness (generalized): Secondary | ICD-10-CM

## 2022-04-19 DIAGNOSIS — R2681 Unsteadiness on feet: Secondary | ICD-10-CM

## 2022-04-19 DIAGNOSIS — M5136 Other intervertebral disc degeneration, lumbar region: Secondary | ICD-10-CM | POA: Diagnosis not present

## 2022-04-19 DIAGNOSIS — R293 Abnormal posture: Secondary | ICD-10-CM

## 2022-04-19 NOTE — Therapy (Signed)
OUTPATIENT PHYSICAL THERAPY PROGRESS NOTE/RE-CERTIFICATION NOTE   Patient Name: Jeffrey [name] MRN: [id number] DOB:[birth year], 61 y.o., male Today's Date: 04/19/2022  PCP: Merryl Hacker, No REFERRING PROVIDER: Wilder Glade, MD  END OF SESSION:   PT End of Session - 04/19/22 1549     Visit Number 5    Number of Visits 25    Date for PT Re-Evaluation 07/12/22    Authorization Type Medicare and Medicaid: PN/recert on 5/62/56-3/89/37    Progress Note Due on Visit 15    PT Start Time 1450    PT Stop Time 1535    PT Time Calculation (min) 45 min    Equipment Utilized During Treatment Other (comment)   L prosthesis   Activity Tolerance Patient limited by pain;Patient limited by fatigue    Behavior During Therapy [facility] for tasks assessed/performed              Past Medical History:  Diagnosis Date   Arthritis    Blood transfusion without reported diagnosis    CHF (congestive heart failure) ([place])    COPD (chronic obstructive pulmonary disease) ([place])    Diabetes mellitus without complication ([place])    Hypertension    PTSD (post-traumatic stress disorder)    Renal disorder    Stage 5 Kidney Failure   Past Surgical History:  Procedure Laterality Date   A/V FISTULAGRAM N/A 08/30/2021   Procedure: A/V FISTULAGRAM;  Surgeon: Waynetta Sandy, MD;  Location: [place] [facility];  Service: Cardiovascular;  Laterality: N/A;   A/V SHUNTOGRAM N/A 11/08/2021   Procedure: A/V SHUNTOGRAM;  Surgeon: Waynetta Sandy, MD;  Location: [place] [facility];  Service: Cardiovascular;  Laterality: N/A;   AMPUTATION Left 04/03/2021   Procedure: LEFT ABOVE-THE-KNEE AMPUTATION;  Surgeon: Erle Crocker, MD;  Location: [place];  Service: Orthopedics;  Laterality: Left;   AV FISTULA PLACEMENT Right 08/12/2021   Procedure: INSERTION OF ARTERIOVENOUS (AV) GORE-TEX GRAFT RIGHT ARM;  Surgeon: Serafina Mitchell, MD;  Location: [place];  Service: Vascular;  Laterality: Right;   IR REMOVAL TUN  CV CATH W/O FL  03/30/2021   JOINT REPLACEMENT     Bilateral knees   PERIPHERAL VASCULAR BALLOON ANGIOPLASTY Right 08/30/2021   Procedure: PERIPHERAL VASCULAR BALLOON ANGIOPLASTY;  Surgeon: Waynetta Sandy, MD;  Location: [place] [facility];  Service: Cardiovascular;  Laterality: Right;   PERIPHERAL VASCULAR INTERVENTION  11/08/2021   Procedure: PERIPHERAL VASCULAR INTERVENTION;  Surgeon: Waynetta Sandy, MD;  Location: [place] [facility];  Service: Cardiovascular;;  Central rt arm fistula   UPPER EXTREMITY VENOGRAPHY Right 08/10/2021   Procedure: UPPER EXTREMITY VENOGRAPHY;  Surgeon: Serafina Mitchell, MD;  Location: [place] [facility];  Service: Cardiovascular;  Laterality: Right;   Patient Active Problem List   Diagnosis Date Noted   Chest pain 11/07/2021   Hypertensive urgency 11/07/2021   Swelling of right upper extremity 11/07/2021   S/P reverse total shoulder arthroplasty, right 10/27/2021   AV graft malfunction ([place]) 09/01/2021   Dialysis AV fistula malfunction, initial encounter ([place]) 08/30/2021   Mood disorder ([place]) 08/30/2021   Hypoglycemia 08/04/2021   Type 2 diabetes mellitus not at goal [facility]) 07/29/2021   Altered mental status 05/04/2021   S/P AKA (above knee amputation), left ([place]) 05/04/2021   Iron deficiency anemia, unspecified 04/13/2021   Sepsis ([place])    Anemia of chronic disease    Infection of prosthetic left knee joint ([place])    MRSA bacteremia 03/30/2021   Left knee pain  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    Fever 03/29/2021   ESRD (end stage renal disease) ([place]) 03/29/2021   COPD (chronic obstructive pulmonary disease) ([place]) 03/29/2021   Hypertension complicating diabetes ([place]) 03/29/2021   Type 2 diabetes mellitus with hyperlipidemia ([place]) 03/29/2021   PTSD (post-traumatic stress disorder) 03/29/2021   CHF (congestive heart failure) ([place]) 03/29/2021   Status post cervical spinal fusion 08/31/2020   Controlled  substance agreement signed 08/30/2020   Secondary hyperparathyroidism of renal origin ([place]) 08/15/2020   Cervical myelopathy ([place]) 07/03/2020   Bilateral bunions 05/08/2020   Hammer toes of both feet 05/08/2020   Onychomycosis of toenail 05/08/2020   Muscle cramps 05/07/2020   History of 2019 novel coronavirus disease (COVID-19) 11/06/2019   Cervical radiculopathy 10/28/2019   Volume overload 06/25/2019   Degenerative disc disease, cervical 04/02/2019   Spinal stenosis of lumbosacral region 08/28/2018   Primary osteoarthritis of both shoulders 02/12/2018   Rotator cuff arthropathy of both shoulders 02/12/2018   Lumbar degenerative disc disease 01/25/2018   Chronic midline low back pain without sciatica 12/15/2017   History of colon polyps 08/15/2016   Mild episode of recurrent major depressive disorder ([place]) 05/30/2016   Vitamin D deficiency 04/18/2016   Chronic insomnia 12/21/2015   Pulmonary hypertension, mild ([place]) 04/29/2014   Chronic gastritis without bleeding 01/13/2014   Diabetic polyneuropathy associated with type 2 diabetes mellitus ([place]) 01/13/2014   Chronic diastolic heart failure ([place]) 12/24/2013   OSA on CPAP 12/24/2013   Primary osteoarthritis of both knees 08/26/2013   Status post total bilateral knee replacement 05/02/2013   Mixed hyperlipidemia 07/30/2012   Class 3 severe obesity due to excess calories with serious comorbidity and body mass index (BMI) of 45.0 to 49.9 in adult ([place]) 07/27/2012   Gastroesophageal reflux disease without esophagitis 05/12/2008   Erectile dysfunction due to diseases classified elsewhere 01/19/2005    REFERRING DIAG: L AKA  THERAPY DIAG:  Other abnormalities of gait and mobility  Muscle weakness (generalized)  Unsteadiness on feet  Abnormal posture  PERTINENT HISTORY: COPD (chronic obstructive pulmonary disease) (CMS-HCC) 05/04/2021   DDD (degenerative disc disease), lumbosacral   Depression 09/28/2009   Diabetes mellitus  type 2, uncomplicated (CMS-HCC)   Dialysis patient (CMS-HCC)   Family hx of colon cancer 08/26/2013   GERD (gastroesophageal reflux disease) 11/28/2008   Headache(784.0)   Heart disease 11/28/2010   Heart murmur, unspecified   History of myocardial infarction   History of pancreatitis 08/26/2013   Hyperlipemia 07/30/2012   Hyperlipidemia LDL goal < 100 07/30/2012   hypertension with heart failure(402.91) 07/27/2012   Kidney disease, stage 4   Lumbar degenerative disc disease   Lumbosacral radiculopathy at L5 03/27/2017   OSA on CPAP  -OA  PRECAUTIONS: Fall (shoulder replacement 09/14/2021, R knee needs to be replaced per pt report)  SUBJECTIVE: Pt reports dialysis wears him out. He is very tired today. He can't don/doff prosthesis on his own because of the R shoulder pain as he had it replaced. He has to rely on his wife to put it on for him. He wore it for 4 hours last week. Hasn't worn it this week.  PAIN:  Are you having pain? Yes: NPRS scale: 5/10 Pain location:  shoulder Pain description: ache Aggravating factors: arthritis, recent right shoulder replacement Relieving factors: rest, medication   TODAY'S TREATMENT:    Pt came in without prosthetic leg on. Pt educated on compliance with prosthetic wear schedule: - to manage edema - to improve sensitivity  in residual leg - to improve function with transfers and WB activities - to promote fluid levels to return to normal levels after amputation in residula leg  Pt had 2 ply socks on today. Put on prosthetic leg, pt required mod A. Pt was reporting pain/pinching in L inner groin area with prosthesis.  Chair to bed transfer: SBA with lateral scoot trasnfer Sit to supine transfer to take prosthesis off and placed another 3 ply sock on (Total 5 ply socks). Prosthesis placed on residual leg with proximal portions of the socks folded over the rim of the prosthetic sock to provide cushioning between socket and skin. Pt educated  that with adequate socks, his residual leg won't sink into socket and will create less pressure also. Pt reported significant pain relieve with standing after aboe adjustments. Gait training: CGA with BRW and wheelchair follow: 40 feet,  pt needed to rest,  Gait speed analysis performed with 10 meter test: 0.09 m/s with BRW and CGA. (Total distance walked 55 feet) Gait speed chart reviewed with patient. Pt is falling in lower end of the household ambulation category. Pt educated that we need to get him to closer to 0.73ms to be more comfortable household Ambulator where he won't need to rely on wheelchair for household mobility. Pt educated on taking longer step with R LE where R foot passes L foot to improve gait efficiency and reduce energy consumption. Pt able to ambulate 419 with BRW at end of the session. Pt demonstrated excessive use of UE on RW, step to pattern, decreased WB on L LE, decreased step length on R LE with his gait. PATIENT EDUCATION: Education details: prosthetic education. Person educated: Patient Education method: Explanation, Demonstration, and Verbal cues Education comprehension: verbalized understanding and needs further education     HOME EXERCISE PROGRAM: None given today, he was not interested in doing a standing HEP at this time.   ASSESSMENT:   CLINICAL IMPRESSION: Pt has been seen for total of 5 sessions from 03/01/22 to 04/19/22 for gait and mobility training with prosthetic training fter L AKA. Pt reports decreased independence with prosthetic donning/doffing due to R shoulder pain. Pt verbalized understanding with compliance of prosthetic wearing and benefits of WB on prosthesis to improve his functional independence. Pt is making slower than normal expected progress towards his functional goals due to decreased endurance and other comorbidities.  Pt will benefit from continued skilled PT to improve I with prosthetic management, functional transfers and gait with  prosthetic limb.     OBJECTIVE IMPAIRMENTS Abnormal gait, cardiopulmonary status limiting activity, decreased activity tolerance, decreased balance, decreased endurance, decreased knowledge of use of DME, decreased mobility, difficulty walking, decreased ROM, decreased strength, impaired perceived functional ability, impaired flexibility, impaired UE functional use, postural dysfunction, prosthetic dependency , and obesity.    ACTIVITY LIMITATIONS cleaning, community activity, and driving.    PERSONAL FACTORS 3+ comorbidities: DMII, dialysis, R TKR, R shoulder replacement, polyneuropathy, OSA, obesity  are also affecting patient's functional outcome.      REHAB POTENTIAL: Good   CLINICAL DECISION MAKING: Evolving/moderate complexity   EVALUATION COMPLEXITY: Moderate     GOALS: Goals reviewed with patient? Yes   SHORT TERM GOALS: Target date: 05/17/2022    Pt will be IND with initial HEP in order to indicate improved functional mobility and dec fall risk. Baseline:  Goal status: not met, not initiated   2.  Will assess gait speed will improve >=0.60 ft/sec w/ RW in order to indicate dec fall  risk.   Baseline: 0.29 ft/s (0/14ms) with BRW and CGA 04/19/22 Goal status: progressing  3.  Pt will ambulate 100' with RW and prosthesis at min A level in order to indicate more independent household ambulation.    Baseline: 513 with BRW 04/19/22 Goal status: met, but needs 3 seated rest breaks   4.  Pt will be able to don/doff prosthesis at S level and wear for 4 hours per day without skin issues and have understanding of sock ply adjustment with min cuing in order to indicate improved wear tolerance.  Baseline:  Goal status: not met, only wearing when "he wants to" and when he has help at home.  Daughter did help him don liner prior to our session.    5.  Pt will perform standing with single UE support x 5 mins, reaching 5" forward, and towards the ground with single UE support.    Baseline:  Goal status: not assessed    6.  Will assess TUG and write LTG to reflect progress.  Baseline:  Goal status: Not assessed    LONG TERM GOALS: Target date: 07/12/2022     Pt will be IND with final HEP in order to indicate improved functional mobility and dec fall risk. Baseline:  Goal status: INITIAL   2.  Pt will improve gait speed by >/=1.0 ft/sec from baseline w/ RW in order to indicate dec fall risk.   Baseline: 0.29 ft/sec with BRW and CGA (04/19/22) Goal status: Progressing continue   3.  Pt will participate in BERG balance test when able to better assess balance.   Baseline: 5/56 (04/19/22) Goal status: progressing   4.  Pt will be able to don/doff prosthesis at mod I level, tolerate wearing prosthesis 8 hours per day without skin issues, and have understanding of sock ply adjustment without cuing.   Baseline: pt is inconsistent with wear schedule 04/19/22 Goal status: Progressing   5.  Pt will ambulate x 300' over indoor and outdoor unlevel paved surfaces with RW and prosthesis at S level in order to indicate improved independence.   Baseline: 558 before needing break (04/19/22) Goal status: progressing   6.  Pt will negotiate up/down 1 step with RW and up/down curb/ramp with RW at S level in order to enter/exit home safety.   Baseline:  Goal status: INITIAL     PLAN: PT FREQUENCY: 2x/week   PT DURATION: 12 weeks   PLANNED INTERVENTIONS: Therapeutic exercises, Therapeutic activity, Neuromuscular re-education, Balance training, Gait training, Patient/Family education, Stair training, Prosthetic training, DME instructions, Aquatic Therapy, and Manual therapy   PLAN FOR NEXT SESSION: Gait speed and BBS assessed. Work I don/doff prosthesis and pt compliance with wearing prosthetic leg daily for 2-4 hours. Continued to work improving step length with R during gait to improve energy conservation with gait and work on BBS components.  KKerrie Pleasure  PT 04/19/2022, 4:09 PM

## 2022-04-21 ENCOUNTER — Ambulatory Visit: Payer: Medicare Other | Admitting: Physical Therapy

## 2022-04-26 ENCOUNTER — Ambulatory Visit: Payer: Medicare Other

## 2022-04-28 ENCOUNTER — Ambulatory Visit
Payer: Medicare Other | Attending: Student in an Organized Health Care Education/Training Program | Admitting: Physical Therapy

## 2022-04-28 ENCOUNTER — Encounter: Payer: Self-pay | Admitting: Physical Therapy

## 2022-04-28 DIAGNOSIS — Z89612 Acquired absence of left leg above knee: Secondary | ICD-10-CM | POA: Insufficient documentation

## 2022-04-28 DIAGNOSIS — M6281 Muscle weakness (generalized): Secondary | ICD-10-CM | POA: Diagnosis present

## 2022-04-28 DIAGNOSIS — R293 Abnormal posture: Secondary | ICD-10-CM | POA: Diagnosis not present

## 2022-04-28 DIAGNOSIS — R2689 Other abnormalities of gait and mobility: Secondary | ICD-10-CM | POA: Diagnosis present

## 2022-04-28 DIAGNOSIS — R2681 Unsteadiness on feet: Secondary | ICD-10-CM | POA: Diagnosis present

## 2022-04-28 NOTE — Therapy (Unsigned)
OUTPATIENT PHYSICAL THERAPY PROGRESS NOTE/RE-CERTIFICATION NOTE   Patient Name: Jeffrey [name] MRN: [id number] DOB:1961-07-23, 61 y.o., male Today's Date: 04/29/2022  PCP: Merryl Hacker, No REFERRING PROVIDER: Wilder Glade, MD  END OF SESSION:   PT End of Session - 04/28/22 1452     Visit Number 6    Number of Visits 25    Date for PT Re-Evaluation 07/12/22    Authorization Type Medicare and Medicaid: PN/recert on 02/23/18-1/66/06    Progress Note Due on Visit 15   progress note done at visit number 5   PT Start Time 1450    PT Stop Time 1530    PT Time Calculation (min) 40 min    Equipment Utilized During Treatment Other (comment);Gait belt   L prosthesis   Activity Tolerance Patient limited by pain;Patient limited by fatigue    Behavior During Therapy [facility] for tasks assessed/performed              Past Medical History:  Diagnosis Date   Arthritis    Blood transfusion without reported diagnosis    CHF (congestive heart failure) ([place])    COPD (chronic obstructive pulmonary disease) ([place])    Diabetes mellitus without complication ([place])    Hypertension    PTSD (post-traumatic stress disorder)    Renal disorder    Stage 5 Kidney Failure   Past Surgical History:  Procedure Laterality Date   A/V FISTULAGRAM N/A 08/30/2021   Procedure: A/V FISTULAGRAM;  Surgeon: Waynetta Sandy, MD;  Location: [place] [facility];  Service: Cardiovascular;  Laterality: N/A;   A/V SHUNTOGRAM N/A 11/08/2021   Procedure: A/V SHUNTOGRAM;  Surgeon: Waynetta Sandy, MD;  Location: [place] [facility];  Service: Cardiovascular;  Laterality: N/A;   AMPUTATION Left 04/03/2021   Procedure: LEFT ABOVE-THE-KNEE AMPUTATION;  Surgeon: Erle Crocker, MD;  Location: [place];  Service: Orthopedics;  Laterality: Left;   AV FISTULA PLACEMENT Right 08/12/2021   Procedure: INSERTION OF ARTERIOVENOUS (AV) GORE-TEX GRAFT RIGHT ARM;  Surgeon: Serafina Mitchell, MD;  Location: [place];  Service:  Vascular;  Laterality: Right;   IR REMOVAL TUN CV CATH W/O FL  03/30/2021   JOINT REPLACEMENT     Bilateral knees   PERIPHERAL VASCULAR BALLOON ANGIOPLASTY Right 08/30/2021   Procedure: PERIPHERAL VASCULAR BALLOON ANGIOPLASTY;  Surgeon: Waynetta Sandy, MD;  Location: [place] [facility];  Service: Cardiovascular;  Laterality: Right;   PERIPHERAL VASCULAR INTERVENTION  11/08/2021   Procedure: PERIPHERAL VASCULAR INTERVENTION;  Surgeon: Waynetta Sandy, MD;  Location: [place] [facility];  Service: Cardiovascular;;  Central rt arm fistula   UPPER EXTREMITY VENOGRAPHY Right 08/10/2021   Procedure: UPPER EXTREMITY VENOGRAPHY;  Surgeon: Serafina Mitchell, MD;  Location: [place] [facility];  Service: Cardiovascular;  Laterality: Right;   Patient Active Problem List   Diagnosis Date Noted   Chest pain 11/07/2021   Hypertensive urgency 11/07/2021   Swelling of right upper extremity 11/07/2021   S/P reverse total shoulder arthroplasty, right 10/27/2021   AV graft malfunction ([place]) 09/01/2021   Dialysis AV fistula malfunction, initial encounter ([place]) 08/30/2021   Mood disorder ([place]) 08/30/2021   Hypoglycemia 08/04/2021   Type 2 diabetes mellitus not at goal [facility]) 07/29/2021   Altered mental status 05/04/2021   S/P AKA (above knee amputation), left ([place]) 05/04/2021   Iron deficiency anemia, unspecified 04/13/2021   Sepsis ([place])    Anemia of chronic disease    Infection of prosthetic left knee joint ([place])  MRSA bacteremia 03/30/2021   Left knee pain    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    Fever 03/29/2021   ESRD (end stage renal disease) ([place]) 03/29/2021   COPD (chronic obstructive pulmonary disease) ([place]) 03/29/2021   Hypertension complicating diabetes ([place]) 03/29/2021   Type 2 diabetes mellitus with hyperlipidemia ([place]) 03/29/2021   PTSD (post-traumatic stress disorder) 03/29/2021   CHF (congestive heart failure) ([place]) 03/29/2021   Status post  cervical spinal fusion 08/31/2020   Controlled substance agreement signed 08/30/2020   Secondary hyperparathyroidism of renal origin ([place]) 08/15/2020   Cervical myelopathy ([place]) 07/03/2020   Bilateral bunions 05/08/2020   Hammer toes of both feet 05/08/2020   Onychomycosis of toenail 05/08/2020   Muscle cramps 05/07/2020   History of 2019 novel coronavirus disease (COVID-19) 11/06/2019   Cervical radiculopathy 10/28/2019   Volume overload 06/25/2019   Degenerative disc disease, cervical 04/02/2019   Spinal stenosis of lumbosacral region 08/28/2018   Primary osteoarthritis of both shoulders 02/12/2018   Rotator cuff arthropathy of both shoulders 02/12/2018   Lumbar degenerative disc disease 01/25/2018   Chronic midline low back pain without sciatica 12/15/2017   History of colon polyps 08/15/2016   Mild episode of recurrent major depressive disorder ([place]) 05/30/2016   Vitamin D deficiency 04/18/2016   Chronic insomnia 12/21/2015   Pulmonary hypertension, mild ([place]) 04/29/2014   Chronic gastritis without bleeding 01/13/2014   Diabetic polyneuropathy associated with type 2 diabetes mellitus ([place]) 01/13/2014   Chronic diastolic heart failure ([place]) 12/24/2013   OSA on CPAP 12/24/2013   Primary osteoarthritis of both knees 08/26/2013   Status post total bilateral knee replacement 05/02/2013   Mixed hyperlipidemia 07/30/2012   Class 3 severe obesity due to excess calories with serious comorbidity and body mass index (BMI) of 45.0 to 49.9 in adult ([place]) 07/27/2012   Gastroesophageal reflux disease without esophagitis 05/12/2008   Erectile dysfunction due to diseases classified elsewhere 01/19/2005    REFERRING DIAG: L AKA  THERAPY DIAG:  Other abnormalities of gait and mobility  Unsteadiness on feet  Muscle weakness (generalized)  Abnormal posture  PERTINENT HISTORY: COPD (chronic obstructive pulmonary disease) (CMS-HCC) 05/04/2021   DDD (degenerative disc disease), lumbosacral    Depression 09/28/2009   Diabetes mellitus type 2, uncomplicated (CMS-HCC)   Dialysis patient (CMS-HCC)   Family hx of colon cancer 08/26/2013   GERD (gastroesophageal reflux disease) 11/28/2008   Headache(784.0)   Heart disease 11/28/2010   Heart murmur, unspecified   History of myocardial infarction   History of pancreatitis 08/26/2013   Hyperlipemia 07/30/2012   Hyperlipidemia LDL goal < 100 07/30/2012   hypertension with heart failure(402.91) 07/27/2012   Kidney disease, stage 4   Lumbar degenerative disc disease   Lumbosacral radiculopathy at L5 03/27/2017   OSA on CPAP  -OA  PRECAUTIONS: Fall (shoulder replacement 09/14/2021, R knee needs to be replaced per pt report)  SUBJECTIVE: No falls. Not wearing the prosthesis but maybe one day a week, "I don't like it". Unable to further elaborate why he does not like it with questioning.   PAIN:  Are you having pain? Yes: NPRS scale: 10/10 Pain location: "all over", shoulder, right knee specifically Pain description: ache Aggravating factors: arthritis, recent right shoulder replacement Relieving factors: rest, "deal with it"   TODAY'S TREATMENT:  CURRENT PROSTHETIC WEAR ASSESSMENT: Patient is independent with: residual limb care Patient is dependent with: correct ply sock adjustment, proper wear schedule/adjustment, proper weight-bearing schedule/adjustment, and donning liner/socket Donning prosthesis: Max A and Total  A Doffing prosthesis: Mod A Prosthetic wear tolerance: unknown "until it irritates me" hours/day, 0-1 days/week Residual limb condition: intact per patient report Prosthetic description: arrives today with liner/prosthesis on. On discussion pt is only wearing it maybe one day a week at home and for limited amount of times, stating until it starts to irritate him. Pilar Plate discussion that inconsistency with wearing the liner/prosthesis is not going to help him get accustomed to it. Pt explained that when he don's it  himself it is not comfortable as he can not get liner correct, it only feel right when someone does it for him. He only has help once or twice a week. Discussed if he has talked to prosthetist about this, pt stated "no" and could not recall who he saw recently here in [place] (prosthesis was made by [employer] in [place]). Discussed skin suction vs use of liner and if it's appropriate would he be willing to try this as it would not involve use of a liner that has to aligned perfectly. Appropriateness will depend on skin integrity of which pt states he "is a [employer], my skin in tough as nails" and possibly other things the prosthetist would know about. Pt was agreeable to PTA reaching out to [facility] to discuss these issues. Also addressed sweat management as pt reports being a heavy sweater and that antiperspirant nor sweat block work for him. Discussed talking to Dr. Sharol Given about getting a prescription for Drysol that is recommended by amputee coalition to help with sweat management, however requires a MD order. Pt to call Dr. Sharol Given if he decides to try this.    GAIT: Gait pattern: step to pattern, decreased step length- Right, decreased step length- Left, decreased stance time- Left, decreased stride length, trunk flexed, and wide BOS Distance walked: 35 feet x 1, 32 feet x 1,  Assistive device utilized:  wide RW and prosthesis Level of assistance:  min guard assist Comments: assistance needed to unlock prosthetic knee with sitting down (auto locks with standing). Cues for posture, walker position and step length.   BALANCE/NMR:  Issued ex's at sink in standing to work on weight shifting and posture. No issues noted or reported in session.   TRANSFERS:   Min guard assist to stand from wheelchair>RW with cues for increased weight shifting and hand placement. Min guard to sit controlled from RW>wheelchair after PTA manually unlocks prosthesis x 2, on 3rd rep after cues and demo by PTA pt placed prosthesis out  and then self unlocked once he was seated.       HOME EXERCISE PROGRAM: Access Code: [email or web address] URL: [email or web address] Date: 04/28/2022 Prepared by: Willow Ora  Exercises - Wide Stance with Counter Support  - 1 x daily - 5 x weekly - 1 sets - 10 reps - Side to Side Weight Shift with Overhead Reach and Counter Support  - 1 x daily - 5 x weekly - 1 sets - 10 reps        GOALS: Goals reviewed with patient? Yes   SHORT TERM GOALS: Target date: 05/17/2022    Pt will be IND with initial HEP in order to indicate improved functional mobility and dec fall risk. Baseline:  Goal status: not met, not initiated   2.  Will assess gait speed will improve >=0.60 ft/sec w/ RW in order to indicate dec fall risk.   Baseline: 0.29 ft/s (0/48ms) with BRW and CGA 04/19/22 Goal status: progressing  3.  Pt will ambulate 100' with RW and prosthesis  at min A level in order to indicate more independent household ambulation.    Baseline: 97' with BRW 04/19/22 Goal status: met, but needs 3 seated rest breaks   4.  Pt will be able to don/doff prosthesis at S level and wear for 4 hours per day without skin issues and have understanding of sock ply adjustment with min cuing in order to indicate improved wear tolerance.  Baseline:  Goal status: not met, only wearing when "he wants to" and when he has help at home.  Daughter did help him don liner prior to our session.    5.  Pt will perform standing with single UE support x 5 mins, reaching 5" forward, and towards the ground with single UE support.   Baseline:  Goal status: not assessed    6.  Will assess TUG and write LTG to reflect progress.  Baseline:  Goal status: Not assessed    LONG TERM GOALS: Target date: 07/12/2022     Pt will be IND with final HEP in order to indicate improved functional mobility and dec fall risk. Baseline:  Goal status: INITIAL   2.  Pt will improve gait speed by >/=1.0 ft/sec from baseline w/ RW in  order to indicate dec fall risk.   Baseline: 0.29 ft/sec with BRW and CGA (04/19/22) Goal status: Progressing continue   3.  Pt will participate in BERG balance test when able to better assess balance.   Baseline: 5/56 (04/19/22) Goal status: progressing   4.  Pt will be able to don/doff prosthesis at mod I level, tolerate wearing prosthesis 8 hours per day without skin issues, and have understanding of sock ply adjustment without cuing.   Baseline: pt is inconsistent with wear schedule 04/19/22 Goal status: Progressing   5.  Pt will ambulate x 300' over indoor and outdoor unlevel paved surfaces with RW and prosthesis at S level in order to indicate improved independence.   Baseline: 24' before needing break (04/19/22) Goal status: progressing   6.  Pt will negotiate up/down 1 step with RW and up/down curb/ramp with RW at S level in order to enter/exit home safety.   Baseline:  Goal status: INITIAL   ASSESSMENT:   CLINICAL IMPRESSION: Pt returns today for 1st time after recert completed due to illness and UE swelling issues from HD. Pt continues to be non compliant with recommendations for wear and use of prosthesis at home, some due to lack of assistance. Pt needed encouragement to do more in session other than walk a full lap around track "that's what I do". Did issue 2 ex's to HEP to work on standing/weight shifting. Reinforced need for pt do to things at home in the prosthesis to build a tolerance to liner/socket and progress independence. Pt not receptive to any feedback/education, " I'm not there yet, my wife wants me to learn how to use this thing". Pt is making limited progress toward goals.      OBJECTIVE IMPAIRMENTS Abnormal gait, cardiopulmonary status limiting activity, decreased activity tolerance, decreased balance, decreased endurance, decreased knowledge of use of DME, decreased mobility, difficulty walking, decreased ROM, decreased strength, impaired perceived functional  ability, impaired flexibility, impaired UE functional use, postural dysfunction, prosthetic dependency , and obesity.    ACTIVITY LIMITATIONS cleaning, community activity, and driving.    PERSONAL FACTORS 3+ comorbidities: DMII, dialysis, R TKR, R shoulder replacement, polyneuropathy, OSA, obesity  are also affecting patient's functional outcome.      REHAB POTENTIAL: Good  CLINICAL DECISION MAKING: Evolving/moderate complexity   EVALUATION COMPLEXITY: Moderate   PLAN: PT FREQUENCY: 2x/week   PT DURATION: 12 weeks   PLANNED INTERVENTIONS: Therapeutic exercises, Therapeutic activity, Neuromuscular re-education, Balance training, Gait training, Patient/Family education, Stair training, Prosthetic training, DME instructions, Aquatic Therapy, and Manual therapy   PLAN FOR NEXT SESSION:  Continue to work on self donning liner/socket (? Skin suction, will follow up with [facility]), gait with wide RW, standing balance     Willow Ora, PTA, [facility] [address], [place], [place] [address] [phone] 04/29/22, 9:49 AM

## 2022-05-03 ENCOUNTER — Ambulatory Visit: Payer: Medicare Other | Admitting: Rehabilitation

## 2022-05-03 ENCOUNTER — Encounter: Payer: Self-pay | Admitting: Rehabilitation

## 2022-05-03 DIAGNOSIS — R2689 Other abnormalities of gait and mobility: Secondary | ICD-10-CM

## 2022-05-03 DIAGNOSIS — R2681 Unsteadiness on feet: Secondary | ICD-10-CM

## 2022-05-03 DIAGNOSIS — M6281 Muscle weakness (generalized): Secondary | ICD-10-CM

## 2022-05-03 DIAGNOSIS — R293 Abnormal posture: Secondary | ICD-10-CM

## 2022-05-03 DIAGNOSIS — Z89612 Acquired absence of left leg above knee: Secondary | ICD-10-CM | POA: Diagnosis not present

## 2022-05-03 NOTE — Therapy (Signed)
OUTPATIENT PHYSICAL THERAPY PROGRESS NOTE   Patient Name: Jeffrey [name] MRN: [id number] DOB:[birth year], 61 y.o., male Today's Date: 05/03/2022  PCP: Merryl Hacker, No REFERRING PROVIDER: Wilder Glade, MD  END OF SESSION:   PT End of Session - 05/03/22 1537     Visit Number 7    Number of Visits 25    Date for PT Re-Evaluation 07/12/22    Authorization Type Medicare and Medicaid: PN/recert on 4/80/16-5/53/74    Progress Note Due on Visit 15   progress note done at visit number 5   PT Start Time 1446    PT Stop Time 1530    PT Time Calculation (min) 44 min    Equipment Utilized During Treatment Other (comment);Gait belt   L prosthesis   Activity Tolerance Patient limited by pain;Patient limited by fatigue    Behavior During Therapy [facility] for tasks assessed/performed              Past Medical History:  Diagnosis Date   Arthritis    Blood transfusion without reported diagnosis    CHF (congestive heart failure) ([place])    COPD (chronic obstructive pulmonary disease) ([place])    Diabetes mellitus without complication ([place])    Hypertension    PTSD (post-traumatic stress disorder)    Renal disorder    Stage 5 Kidney Failure   Past Surgical History:  Procedure Laterality Date   A/V FISTULAGRAM N/A 08/30/2021   Procedure: A/V FISTULAGRAM;  Surgeon: Waynetta Sandy, MD;  Location: [place] [facility];  Service: Cardiovascular;  Laterality: N/A;   A/V SHUNTOGRAM N/A 11/08/2021   Procedure: A/V SHUNTOGRAM;  Surgeon: Waynetta Sandy, MD;  Location: [place] [facility];  Service: Cardiovascular;  Laterality: N/A;   AMPUTATION Left 04/03/2021   Procedure: LEFT ABOVE-THE-KNEE AMPUTATION;  Surgeon: Erle Crocker, MD;  Location: [place];  Service: Orthopedics;  Laterality: Left;   AV FISTULA PLACEMENT Right 08/12/2021   Procedure: INSERTION OF ARTERIOVENOUS (AV) GORE-TEX GRAFT RIGHT ARM;  Surgeon: Serafina Mitchell, MD;  Location: [place];  Service: Vascular;  Laterality:  Right;   IR REMOVAL TUN CV CATH W/O FL  03/30/2021   JOINT REPLACEMENT     Bilateral knees   PERIPHERAL VASCULAR BALLOON ANGIOPLASTY Right 08/30/2021   Procedure: PERIPHERAL VASCULAR BALLOON ANGIOPLASTY;  Surgeon: Waynetta Sandy, MD;  Location: [place] [facility];  Service: Cardiovascular;  Laterality: Right;   PERIPHERAL VASCULAR INTERVENTION  11/08/2021   Procedure: PERIPHERAL VASCULAR INTERVENTION;  Surgeon: Waynetta Sandy, MD;  Location: [place] [facility];  Service: Cardiovascular;;  Central rt arm fistula   UPPER EXTREMITY VENOGRAPHY Right 08/10/2021   Procedure: UPPER EXTREMITY VENOGRAPHY;  Surgeon: Serafina Mitchell, MD;  Location: [place] [facility];  Service: Cardiovascular;  Laterality: Right;   Patient Active Problem List   Diagnosis Date Noted   Chest pain 11/07/2021   Hypertensive urgency 11/07/2021   Swelling of right upper extremity 11/07/2021   S/P reverse total shoulder arthroplasty, right 10/27/2021   AV graft malfunction ([place]) 09/01/2021   Dialysis AV fistula malfunction, initial encounter ([place]) 08/30/2021   Mood disorder ([place]) 08/30/2021   Hypoglycemia 08/04/2021   Type 2 diabetes mellitus not at goal [facility]) 07/29/2021   Altered mental status 05/04/2021   S/P AKA (above knee amputation), left ([place]) 05/04/2021   Iron deficiency anemia, unspecified 04/13/2021   Sepsis ([place])    Anemia of chronic disease    Infection of prosthetic left knee joint ([place])    MRSA  bacteremia 03/30/2021   Left knee pain    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    Fever 03/29/2021   ESRD (end stage renal disease) ([place]) 03/29/2021   COPD (chronic obstructive pulmonary disease) ([place]) 03/29/2021   Hypertension complicating diabetes ([place]) 03/29/2021   Type 2 diabetes mellitus with hyperlipidemia ([place]) 03/29/2021   PTSD (post-traumatic stress disorder) 03/29/2021   CHF (congestive heart failure) ([place]) 03/29/2021   Status post cervical spinal fusion  08/31/2020   Controlled substance agreement signed 08/30/2020   Secondary hyperparathyroidism of renal origin ([place]) 08/15/2020   Cervical myelopathy ([place]) 07/03/2020   Bilateral bunions 05/08/2020   Hammer toes of both feet 05/08/2020   Onychomycosis of toenail 05/08/2020   Muscle cramps 05/07/2020   History of 2019 novel coronavirus disease (COVID-19) 11/06/2019   Cervical radiculopathy 10/28/2019   Volume overload 06/25/2019   Degenerative disc disease, cervical 04/02/2019   Spinal stenosis of lumbosacral region 08/28/2018   Primary osteoarthritis of both shoulders 02/12/2018   Rotator cuff arthropathy of both shoulders 02/12/2018   Lumbar degenerative disc disease 01/25/2018   Chronic midline low back pain without sciatica 12/15/2017   History of colon polyps 08/15/2016   Mild episode of recurrent major depressive disorder ([place]) 05/30/2016   Vitamin D deficiency 04/18/2016   Chronic insomnia 12/21/2015   Pulmonary hypertension, mild ([place]) 04/29/2014   Chronic gastritis without bleeding 01/13/2014   Diabetic polyneuropathy associated with type 2 diabetes mellitus ([place]) 01/13/2014   Chronic diastolic heart failure ([place]) 12/24/2013   OSA on CPAP 12/24/2013   Primary osteoarthritis of both knees 08/26/2013   Status post total bilateral knee replacement 05/02/2013   Mixed hyperlipidemia 07/30/2012   Class 3 severe obesity due to excess calories with serious comorbidity and body mass index (BMI) of 45.0 to 49.9 in adult ([place]) 07/27/2012   Gastroesophageal reflux disease without esophagitis 05/12/2008   Erectile dysfunction due to diseases classified elsewhere 01/19/2005    REFERRING DIAG: L AKA  THERAPY DIAG:  Other abnormalities of gait and mobility  Unsteadiness on feet  Muscle weakness (generalized)  Abnormal posture  PERTINENT HISTORY: COPD (chronic obstructive pulmonary disease) (CMS-HCC) 05/04/2021   DDD (degenerative disc disease), lumbosacral   Depression  09/28/2009   Diabetes mellitus type 2, uncomplicated (CMS-HCC)   Dialysis patient (CMS-HCC)   Family hx of colon cancer 08/26/2013   GERD (gastroesophageal reflux disease) 11/28/2008   Headache(784.0)   Heart disease 11/28/2010   Heart murmur, unspecified   History of myocardial infarction   History of pancreatitis 08/26/2013   Hyperlipemia 07/30/2012   Hyperlipidemia LDL goal < 100 07/30/2012   hypertension with heart failure(402.91) 07/27/2012   Kidney disease, stage 4   Lumbar degenerative disc disease   Lumbosacral radiculopathy at L5 03/27/2017   OSA on CPAP  -OA  PRECAUTIONS: Fall (shoulder replacement 09/14/2021, R knee needs to be replaced per pt report)  SUBJECTIVE: Pt reports not wanting to be here today.  "I want to go home."   PAIN:  Are you having pain? Yes: NPRS scale: 10/10 Pain location: "all over", shoulder, right knee specifically Pain description: ache Aggravating factors: arthritis, recent right shoulder replacement Relieving factors: rest, "deal with it"   TODAY'S TREATMENT:  CURRENT PROSTHETIC WEAR ASSESSMENT: Patient is independent with: residual limb care Patient is dependent with: correct ply sock adjustment, proper wear schedule/adjustment, proper weight-bearing schedule/adjustment, and donning liner/socket Donning prosthesis: Max A and Total A Doffing prosthesis: Mod A Prosthetic wear tolerance: unknown "until it irritates me" hours/day, 0-1  days/week Residual limb condition: intact per patient report Prosthetic description: He did not have liner or leg donned today and reporting need help getting liner donned.  Continue to provide max education on importance of wearing prosthesis consistently and daily.  He continues to report that he only has his daughters help every once in a while.  He continues to report, "I have to do this my way"  Discussed having him hold until his shoulder is better, however he would like to continue with therapy. Discussion  was not productive therefore PT finally called Jamey Reas a PT at another clinic who did come over and problem solved to trying to get a PVC pipe that is 8" in diameter and 10" long, inverting liner over pipe and then in standing place liner/pipe on chair and lower into liner.  Will hopefully have this to try on Thursday when he is here.    GAIT: Gait pattern: step to pattern, decreased step length- Right, decreased step length- Left, decreased stance time- Left, decreased stride length, trunk flexed, and wide BOS Distance walked: 38', 45', 46', 21' Assistive device utilized:  wide RW and prosthesis Level of assistance:  min guard assist Comments: Once standing, had pt keep knee locked out and sit by kicking prosthesis out prior to sitting.  Cues for upright posture and improved weight shift over prosthesis.  Also continue to provide cues for RW, step, RW, step.  PT demo'd advancing LLE, shifting forward with hip protraction then advancing RLE.    BALANCE/NMR:  Standing at [place], had pt tap 4" step with RLE x 10 reps with cues for posture and L lateral weight shift.    TRANSFERS:   Min guard assist to stand from wheelchair>RW with cues for increased weight shifting and hand placement.        HOME EXERCISE PROGRAM: Access Code: [email or web address] URL: [email or web address] Date: 04/28/2022 Prepared by: Willow Ora  Exercises - Wide Stance with Counter Support  - 1 x daily - 5 x weekly - 1 sets - 10 reps - Side to Side Weight Shift with Overhead Reach and Counter Support  - 1 x daily - 5 x weekly - 1 sets - 10 reps        GOALS: Goals reviewed with patient? Yes   SHORT TERM GOALS: Target date: 05/17/2022    Pt will be IND with initial HEP in order to indicate improved functional mobility and dec fall risk. Baseline:  Goal status: not met, not initiated   2.  Will assess gait speed will improve >=0.60 ft/sec w/ RW in order to indicate dec fall risk.   Baseline: 0.29 ft/s  (0/65ms) with BRW and CGA 04/19/22 Goal status: progressing  3.  Pt will ambulate 100' with RW and prosthesis at min A level in order to indicate more independent household ambulation.    Baseline: 591 with BRW 04/19/22 Goal status: met, but needs 3 seated rest breaks   4.  Pt will be able to don/doff prosthesis at S level and wear for 4 hours per day without skin issues and have understanding of sock ply adjustment with min cuing in order to indicate improved wear tolerance.  Baseline:  Goal status: not met, only wearing when "he wants to" and when he has help at home.  Daughter did help him don liner prior to our session.    5.  Pt will perform standing with single UE support x 5 mins, reaching 5" forward, and towards the ground with single  UE support.   Baseline:  Goal status: not assessed    6.  Will assess TUG and write LTG to reflect progress.  Baseline:  Goal status: Not assessed    LONG TERM GOALS: Target date: 07/12/2022     Pt will be IND with final HEP in order to indicate improved functional mobility and dec fall risk. Baseline:  Goal status: INITIAL   2.  Pt will improve gait speed by >/=1.0 ft/sec from baseline w/ RW in order to indicate dec fall risk.   Baseline: 0.29 ft/sec with BRW and CGA (04/19/22) Goal status: Progressing continue   3.  Pt will participate in BERG balance test when able to better assess balance.   Baseline: 5/56 (04/19/22) Goal status: progressing   4.  Pt will be able to don/doff prosthesis at mod I level, tolerate wearing prosthesis 8 hours per day without skin issues, and have understanding of sock ply adjustment without cuing.   Baseline: pt is inconsistent with wear schedule 04/19/22 Goal status: Progressing   5.  Pt will ambulate x 300' over indoor and outdoor unlevel paved surfaces with RW and prosthesis at S level in order to indicate improved independence.   Baseline: 30' before needing break (04/19/22) Goal status: progressing   6.   Pt will negotiate up/down 1 step with RW and up/down curb/ramp with RW at S level in order to enter/exit home safety.   Baseline:  Goal status: INITIAL   ASSESSMENT:   CLINICAL IMPRESSION: Skilled session continues to focus on prosthetic training with RW.  Also had pt agree to to standing R step taps for improved prosthetic weight bearing/weight shifting.  Continue to have lengthy discussion on wearing prosthesis and donning/doffing.  We did come up with a possible solution for donning liner, he is going to try this and bring on Thursday.       OBJECTIVE IMPAIRMENTS Abnormal gait, cardiopulmonary status limiting activity, decreased activity tolerance, decreased balance, decreased endurance, decreased knowledge of use of DME, decreased mobility, difficulty walking, decreased ROM, decreased strength, impaired perceived functional ability, impaired flexibility, impaired UE functional use, postural dysfunction, prosthetic dependency , and obesity.    ACTIVITY LIMITATIONS cleaning, community activity, and driving.    PERSONAL FACTORS 3+ comorbidities: DMII, dialysis, R TKR, R shoulder replacement, polyneuropathy, OSA, obesity  are also affecting patient's functional outcome.      REHAB POTENTIAL: Good   CLINICAL DECISION MAKING: Evolving/moderate complexity   EVALUATION COMPLEXITY: Moderate   PLAN: PT FREQUENCY: 2x/week   PT DURATION: 12 weeks   PLANNED INTERVENTIONS: Therapeutic exercises, Therapeutic activity, Neuromuscular re-education, Balance training, Gait training, Patient/Family education, Stair training, Prosthetic training, DME instructions, Aquatic Therapy, and Manual therapy   PLAN FOR NEXT SESSION:  Continue to work on self donning liner/socket (? Skin suction, will follow up with [facility]), gait with wide RW, standing balance     Cameron Sprang, PT, MPT [facility] [address] [place], [place], [address] Phone: [phone]    Fax:  [phone] 05/03/22, 4:41 PM

## 2022-05-05 ENCOUNTER — Ambulatory Visit: Payer: Medicare Other

## 2022-05-17 ENCOUNTER — Ambulatory Visit: Payer: Medicare Other | Admitting: Rehabilitation

## 2022-05-17 ENCOUNTER — Encounter: Payer: Self-pay | Admitting: Rehabilitation

## 2022-05-17 DIAGNOSIS — M6281 Muscle weakness (generalized): Secondary | ICD-10-CM

## 2022-05-17 DIAGNOSIS — R293 Abnormal posture: Secondary | ICD-10-CM

## 2022-05-17 DIAGNOSIS — R2681 Unsteadiness on feet: Secondary | ICD-10-CM

## 2022-05-17 DIAGNOSIS — R2689 Other abnormalities of gait and mobility: Secondary | ICD-10-CM

## 2022-05-17 DIAGNOSIS — Z89612 Acquired absence of left leg above knee: Secondary | ICD-10-CM | POA: Diagnosis not present

## 2022-05-17 NOTE — Therapy (Signed)
OUTPATIENT PHYSICAL THERAPY PROGRESS NOTE   Patient Name: Jeffrey [name] MRN: [id number] DOB:Sep 01, 1961, 61 y.o., male Today's Date: 05/17/2022  PCP: Merryl Hacker, No REFERRING PROVIDER: Wilder Glade, MD  END OF SESSION:   PT End of Session - 05/17/22 1426     Visit Number 8    Number of Visits 25    Date for PT Re-Evaluation 07/12/22    Authorization Type Medicare and Medicaid: PN/recert on 0/81/44-07/16/55    Progress Note Due on Visit 15   progress note done at visit number 5   PT Start Time 1220    PT Stop Time 1305    PT Time Calculation (min) 45 min    Equipment Utilized During Treatment Other (comment);Gait belt   L prosthesis   Activity Tolerance Patient limited by pain;Patient limited by fatigue    Behavior During Therapy [facility] for tasks assessed/performed              Past Medical History:  Diagnosis Date   Arthritis    Blood transfusion without reported diagnosis    CHF (congestive heart failure) ([place])    COPD (chronic obstructive pulmonary disease) ([place])    Diabetes mellitus without complication ([place])    Hypertension    PTSD (post-traumatic stress disorder)    Renal disorder    Stage 5 Kidney Failure   Past Surgical History:  Procedure Laterality Date   A/V FISTULAGRAM N/A 08/30/2021   Procedure: A/V FISTULAGRAM;  Surgeon: Waynetta Sandy, MD;  Location: [place] [facility];  Service: Cardiovascular;  Laterality: N/A;   A/V SHUNTOGRAM N/A 11/08/2021   Procedure: A/V SHUNTOGRAM;  Surgeon: Waynetta Sandy, MD;  Location: [place] [facility];  Service: Cardiovascular;  Laterality: N/A;   AMPUTATION Left 04/03/2021   Procedure: LEFT ABOVE-THE-KNEE AMPUTATION;  Surgeon: Erle Crocker, MD;  Location: [place];  Service: Orthopedics;  Laterality: Left;   AV FISTULA PLACEMENT Right 08/12/2021   Procedure: INSERTION OF ARTERIOVENOUS (AV) GORE-TEX GRAFT RIGHT ARM;  Surgeon: Serafina Mitchell, MD;  Location: [place];  Service: Vascular;  Laterality:  Right;   IR REMOVAL TUN CV CATH W/O FL  03/30/2021   JOINT REPLACEMENT     Bilateral knees   PERIPHERAL VASCULAR BALLOON ANGIOPLASTY Right 08/30/2021   Procedure: PERIPHERAL VASCULAR BALLOON ANGIOPLASTY;  Surgeon: Waynetta Sandy, MD;  Location: [place] [facility];  Service: Cardiovascular;  Laterality: Right;   PERIPHERAL VASCULAR INTERVENTION  11/08/2021   Procedure: PERIPHERAL VASCULAR INTERVENTION;  Surgeon: Waynetta Sandy, MD;  Location: [place] [facility];  Service: Cardiovascular;;  Central rt arm fistula   UPPER EXTREMITY VENOGRAPHY Right 08/10/2021   Procedure: UPPER EXTREMITY VENOGRAPHY;  Surgeon: Serafina Mitchell, MD;  Location: [place] [facility];  Service: Cardiovascular;  Laterality: Right;   Patient Active Problem List   Diagnosis Date Noted   Chest pain 11/07/2021   Hypertensive urgency 11/07/2021   Swelling of right upper extremity 11/07/2021   S/P reverse total shoulder arthroplasty, right 10/27/2021   AV graft malfunction ([place]) 09/01/2021   Dialysis AV fistula malfunction, initial encounter ([place]) 08/30/2021   Mood disorder ([place]) 08/30/2021   Hypoglycemia 08/04/2021   Type 2 diabetes mellitus not at goal [facility]) 07/29/2021   Altered mental status 05/04/2021   S/P AKA (above knee amputation), left ([place]) 05/04/2021   Iron deficiency anemia, unspecified 04/13/2021   Sepsis ([place])    Anemia of chronic disease    Infection of prosthetic left knee joint ([place])    MRSA  bacteremia 03/30/2021   Left knee pain    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    Fever 03/29/2021   ESRD (end stage renal disease) ([place]) 03/29/2021   COPD (chronic obstructive pulmonary disease) ([place]) 03/29/2021   Hypertension complicating diabetes ([place]) 03/29/2021   Type 2 diabetes mellitus with hyperlipidemia ([place]) 03/29/2021   PTSD (post-traumatic stress disorder) 03/29/2021   CHF (congestive heart failure) ([place]) 03/29/2021   Status post cervical spinal fusion  08/31/2020   Controlled substance agreement signed 08/30/2020   Secondary hyperparathyroidism of renal origin ([place]) 08/15/2020   Cervical myelopathy ([place]) 07/03/2020   Bilateral bunions 05/08/2020   Hammer toes of both feet 05/08/2020   Onychomycosis of toenail 05/08/2020   Muscle cramps 05/07/2020   History of 2019 novel coronavirus disease (COVID-19) 11/06/2019   Cervical radiculopathy 10/28/2019   Volume overload 06/25/2019   Degenerative disc disease, cervical 04/02/2019   Spinal stenosis of lumbosacral region 08/28/2018   Primary osteoarthritis of both shoulders 02/12/2018   Rotator cuff arthropathy of both shoulders 02/12/2018   Lumbar degenerative disc disease 01/25/2018   Chronic midline low back pain without sciatica 12/15/2017   History of colon polyps 08/15/2016   Mild episode of recurrent major depressive disorder ([place]) 05/30/2016   Vitamin D deficiency 04/18/2016   Chronic insomnia 12/21/2015   Pulmonary hypertension, mild ([place]) 04/29/2014   Chronic gastritis without bleeding 01/13/2014   Diabetic polyneuropathy associated with type 2 diabetes mellitus ([place]) 01/13/2014   Chronic diastolic heart failure ([place]) 12/24/2013   OSA on CPAP 12/24/2013   Primary osteoarthritis of both knees 08/26/2013   Status post total bilateral knee replacement 05/02/2013   Mixed hyperlipidemia 07/30/2012   Class 3 severe obesity due to excess calories with serious comorbidity and body mass index (BMI) of 45.0 to 49.9 in adult ([place]) 07/27/2012   Gastroesophageal reflux disease without esophagitis 05/12/2008   Erectile dysfunction due to diseases classified elsewhere 01/19/2005    REFERRING DIAG: L AKA  THERAPY DIAG:  Other abnormalities of gait and mobility  Unsteadiness on feet  Muscle weakness (generalized)  Abnormal posture  PERTINENT HISTORY: COPD (chronic obstructive pulmonary disease) (CMS-HCC) 05/04/2021   DDD (degenerative disc disease), lumbosacral   Depression  09/28/2009   Diabetes mellitus type 2, uncomplicated (CMS-HCC)   Dialysis patient (CMS-HCC)   Family hx of colon cancer 08/26/2013   GERD (gastroesophageal reflux disease) 11/28/2008   Headache(784.0)   Heart disease 11/28/2010   Heart murmur, unspecified   History of myocardial infarction   History of pancreatitis 08/26/2013   Hyperlipemia 07/30/2012   Hyperlipidemia LDL goal < 100 07/30/2012   hypertension with heart failure(402.91) 07/27/2012   Kidney disease, stage 4   Lumbar degenerative disc disease   Lumbosacral radiculopathy at L5 03/27/2017   OSA on CPAP  -OA  PRECAUTIONS: Fall (shoulder replacement 09/14/2021, R knee needs to be replaced per pt report)  SUBJECTIVE: Pt reports he gets his power w/c on Thursday.   PAIN:  Are you having pain? Yes: NPRS scale: 4/10 Pain location: "all over", shoulder, right knee specifically Pain description: ache Aggravating factors: arthritis, recent right shoulder replacement Relieving factors: rest, "deal with it"   TODAY'S TREATMENT:  CURRENT PROSTHETIC WEAR ASSESSMENT: Patient is independent with: residual limb care Patient is dependent with: correct ply sock adjustment, proper wear schedule/adjustment, proper weight-bearing schedule/adjustment, and donning liner/socket Donning prosthesis: Max A and Total A Doffing prosthesis: Mod A Prosthetic wear tolerance: about once a week which is at therapy.  Residual limb condition:  intact per patient report Prosthetic description: He did not have liner or leg donned today and reporting need help getting liner donned.  Note that other PT had purchased a pot that was about 8" in diameter and we did try this in session inverting the liner over this pot and having him stand and try to get into liner.  The pot was too small and he needs larger diameter.  He continues to report that he wants to get suction suspension but PT has educated on multiple occasions that prosthetist will not do this  because he will shrink and then need a revision which he can't get for another 6 months.  Pt verbalized understanding.  PT did try using large pot (removed handles) which was 9.25" in diameter and he was able to get down into it, it just wasn't deep enough.  PT to go to [employer] or [employer] to see if she can find pot or other device to assist.  We will try this next week, otherwise, I feel we are going to have to DC.   Self Care:  Had lengthy discussion regarding lack of progress.  Pt seems to be okay with getting power chair and using this to get around "until I have someone that can help me."  Discussed that realistically he or wife needs to make a decision about coming here to [facility] or him going to [facility] to live because he needs assistance daily and has been unable to get home health aide which he has tried to get for a year now per pt report.  Pt called wife during session so that we could all have a discussion. She seems unrealistic about his goals and just wants "him to do it" as much as he can until he can get an aide.  Discussed that we cannot keep him in therapy just to don his brace and that we have to show progress to keep him on caseload and that goals are for him to be more independent at home which he can't do at this time as he is unable to don liner.  PT actually did ask wife if she would be willing to move here or have him come there and she could not give a reason why just states "has other commitments."  She was very resistant to what therapist was stating during phone call and eventually ended call.            HOME EXERCISE PROGRAM: Access Code: [email or web address] URL: [email or web address] Date: 04/28/2022 Prepared by: Willow Ora  Exercises - Wide Stance with Counter Support  - 1 x daily - 5 x weekly - 1 sets - 10 reps - Side to Side Weight Shift with Overhead Reach and Counter Support  - 1 x daily - 5 x weekly - 1 sets - 10 reps        GOALS: Goals reviewed  with patient? Yes   SHORT TERM GOALS: Target date: 05/17/2022    Pt will be IND with initial HEP in order to indicate improved functional mobility and dec fall risk. Baseline:  Goal status: not met, not initiated   2.  Will assess gait speed will improve >=0.60 ft/sec w/ RW in order to indicate dec fall risk.   Baseline: 0.29 ft/s (0/57ms) with BRW and CGA 04/19/22 Goal status: progressing  3.  Pt will ambulate 100' with RW and prosthesis at min A level in order to indicate more independent household ambulation.    Baseline: 515 with  BRW 04/19/22 Goal status: met, but needs 3 seated rest breaks   4.  Pt will be able to don/doff prosthesis at S level and wear for 4 hours per day without skin issues and have understanding of sock ply adjustment with min cuing in order to indicate improved wear tolerance.  Baseline:  Goal status: not met, only wearing when "he wants to" and when he has help at home.  Daughter did help him don liner prior to our session.    5.  Pt will perform standing with single UE support x 5 mins, reaching 5" forward, and towards the ground with single UE support.   Baseline:  Goal status: not assessed    6.  Will assess TUG and write LTG to reflect progress.  Baseline:  Goal status: Not assessed    LONG TERM GOALS: Target date: 07/12/2022     Pt will be IND with final HEP in order to indicate improved functional mobility and dec fall risk. Baseline:  Goal status: INITIAL   2.  Pt will improve gait speed by >/=1.0 ft/sec from baseline w/ RW in order to indicate dec fall risk.   Baseline: 0.29 ft/sec with BRW and CGA (04/19/22) Goal status: Progressing continue   3.  Pt will participate in BERG balance test when able to better assess balance.   Baseline: 5/56 (04/19/22) Goal status: progressing   4.  Pt will be able to don/doff prosthesis at mod I level, tolerate wearing prosthesis 8 hours per day without skin issues, and have understanding of sock ply  adjustment without cuing.   Baseline: pt is inconsistent with wear schedule 04/19/22 Goal status: Progressing   5.  Pt will ambulate x 300' over indoor and outdoor unlevel paved surfaces with RW and prosthesis at S level in order to indicate improved independence.   Baseline: 28' before needing break (04/19/22) Goal status: progressing   6.  Pt will negotiate up/down 1 step with RW and up/down curb/ramp with RW at S level in order to enter/exit home safety.   Baseline:  Goal status: INITIAL   ASSESSMENT:   CLINICAL IMPRESSION: Skilled session focused on trying to don liner with use of 8" diameter planter pot, however this was too small.  Had lengthy discussion with pt and wife about his lack of progress due to inability to don liner and wear prosthesis at home.  PT to try and find something bigger for next session, otherwise, we may need to D/C.        OBJECTIVE IMPAIRMENTS Abnormal gait, cardiopulmonary status limiting activity, decreased activity tolerance, decreased balance, decreased endurance, decreased knowledge of use of DME, decreased mobility, difficulty walking, decreased ROM, decreased strength, impaired perceived functional ability, impaired flexibility, impaired UE functional use, postural dysfunction, prosthetic dependency , and obesity.    ACTIVITY LIMITATIONS cleaning, community activity, and driving.    PERSONAL FACTORS 3+ comorbidities: DMII, dialysis, R TKR, R shoulder replacement, polyneuropathy, OSA, obesity  are also affecting patient's functional outcome.      REHAB POTENTIAL: Good   CLINICAL DECISION MAKING: Evolving/moderate complexity   EVALUATION COMPLEXITY: Moderate   PLAN: PT FREQUENCY: 2x/week   PT DURATION: 12 weeks   PLANNED INTERVENTIONS: Therapeutic exercises, Therapeutic activity, Neuromuscular re-education, Balance training, Gait training, Patient/Family education, Stair training, Prosthetic training, DME instructions, Aquatic Therapy, and Manual  therapy   PLAN FOR NEXT SESSION:  Did Raquel Sarna find new way to don liner?? If not, maybe call Gerald Stabs and see if we can do something  else, gait with wide RW, standing balance     Cameron Sprang, PT, MPT [facility] [address] [place], [place], [address] Phone: [phone]   Fax:  [phone] 05/17/22, 2:30 PM

## 2022-05-24 ENCOUNTER — Ambulatory Visit: Payer: Medicare Other | Admitting: Rehabilitation

## 2022-06-02 ENCOUNTER — Ambulatory Visit: Payer: Medicare Other | Admitting: Physical Therapy

## 2022-06-07 ENCOUNTER — Encounter: Payer: Self-pay | Admitting: Rehabilitation

## 2022-06-07 ENCOUNTER — Ambulatory Visit: Payer: Medicare Other | Attending: Rehabilitation | Admitting: Rehabilitation

## 2022-06-07 DIAGNOSIS — R2681 Unsteadiness on feet: Secondary | ICD-10-CM | POA: Diagnosis present

## 2022-06-07 DIAGNOSIS — M6281 Muscle weakness (generalized): Secondary | ICD-10-CM | POA: Diagnosis present

## 2022-06-07 DIAGNOSIS — R293 Abnormal posture: Secondary | ICD-10-CM | POA: Insufficient documentation

## 2022-06-07 DIAGNOSIS — R2689 Other abnormalities of gait and mobility: Secondary | ICD-10-CM | POA: Diagnosis present

## 2022-06-07 NOTE — Therapy (Signed)
OUTPATIENT PHYSICAL THERAPY PROGRESS NOTE   Patient Name: Jeffrey [name] MRN: [id number] DOB:[birth year], 61 y.o., male Today's Date: 06/07/2022  PCP: Merryl Hacker, No REFERRING PROVIDER: Wilder Glade, MD  END OF SESSION:   PT End of Session - 06/07/22 1501     Visit Number 9    Number of Visits 25    Date for PT Re-Evaluation 07/12/22    Authorization Type Medicare and Medicaid: PN/recert on 7/67/34-1/93/79    Progress Note Due on Visit 15   progress note done at visit number 5   PT Start Time 1400    PT Stop Time 1445    PT Time Calculation (min) 45 min    Equipment Utilized During Treatment Other (comment);Gait belt   L prosthesis   Activity Tolerance Patient limited by pain;Patient limited by fatigue    Behavior During Therapy [facility] for tasks assessed/performed              Past Medical History:  Diagnosis Date   Arthritis    Blood transfusion without reported diagnosis    CHF (congestive heart failure) ([place])    COPD (chronic obstructive pulmonary disease) ([place])    Diabetes mellitus without complication ([place])    Hypertension    PTSD (post-traumatic stress disorder)    Renal disorder    Stage 5 Kidney Failure   Past Surgical History:  Procedure Laterality Date   A/V FISTULAGRAM N/A 08/30/2021   Procedure: A/V FISTULAGRAM;  Surgeon: Waynetta Sandy, MD;  Location: [place] [facility];  Service: Cardiovascular;  Laterality: N/A;   A/V SHUNTOGRAM N/A 11/08/2021   Procedure: A/V SHUNTOGRAM;  Surgeon: Waynetta Sandy, MD;  Location: [place] [facility];  Service: Cardiovascular;  Laterality: N/A;   AMPUTATION Left 04/03/2021   Procedure: LEFT ABOVE-THE-KNEE AMPUTATION;  Surgeon: Erle Crocker, MD;  Location: [place];  Service: Orthopedics;  Laterality: Left;   AV FISTULA PLACEMENT Right 08/12/2021   Procedure: INSERTION OF ARTERIOVENOUS (AV) GORE-TEX GRAFT RIGHT ARM;  Surgeon: Serafina Mitchell, MD;  Location: [place];  Service: Vascular;  Laterality:  Right;   IR REMOVAL TUN CV CATH W/O FL  03/30/2021   JOINT REPLACEMENT     Bilateral knees   PERIPHERAL VASCULAR BALLOON ANGIOPLASTY Right 08/30/2021   Procedure: PERIPHERAL VASCULAR BALLOON ANGIOPLASTY;  Surgeon: Waynetta Sandy, MD;  Location: [place] [facility];  Service: Cardiovascular;  Laterality: Right;   PERIPHERAL VASCULAR INTERVENTION  11/08/2021   Procedure: PERIPHERAL VASCULAR INTERVENTION;  Surgeon: Waynetta Sandy, MD;  Location: [place] [facility];  Service: Cardiovascular;;  Central rt arm fistula   UPPER EXTREMITY VENOGRAPHY Right 08/10/2021   Procedure: UPPER EXTREMITY VENOGRAPHY;  Surgeon: Serafina Mitchell, MD;  Location: [place] [facility];  Service: Cardiovascular;  Laterality: Right;   Patient Active Problem List   Diagnosis Date Noted   Chest pain 11/07/2021   Hypertensive urgency 11/07/2021   Swelling of right upper extremity 11/07/2021   S/P reverse total shoulder arthroplasty, right 10/27/2021   AV graft malfunction ([place]) 09/01/2021   Dialysis AV fistula malfunction, initial encounter ([place]) 08/30/2021   Mood disorder ([place]) 08/30/2021   Hypoglycemia 08/04/2021   Type 2 diabetes mellitus not at goal [facility]) 07/29/2021   Altered mental status 05/04/2021   S/P AKA (above knee amputation), left ([place]) 05/04/2021   Iron deficiency anemia, unspecified 04/13/2021   Sepsis ([place])    Anemia of chronic disease    Infection of prosthetic left knee joint ([place])    MRSA  bacteremia 03/30/2021   Left knee pain    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    Fever 03/29/2021   ESRD (end stage renal disease) ([place]) 03/29/2021   COPD (chronic obstructive pulmonary disease) ([place]) 03/29/2021   Hypertension complicating diabetes ([place]) 03/29/2021   Type 2 diabetes mellitus with hyperlipidemia ([place]) 03/29/2021   PTSD (post-traumatic stress disorder) 03/29/2021   CHF (congestive heart failure) ([place]) 03/29/2021   Status post cervical spinal fusion  08/31/2020   Controlled substance agreement signed 08/30/2020   Secondary hyperparathyroidism of renal origin ([place]) 08/15/2020   Cervical myelopathy ([place]) 07/03/2020   Bilateral bunions 05/08/2020   Hammer toes of both feet 05/08/2020   Onychomycosis of toenail 05/08/2020   Muscle cramps 05/07/2020   History of 2019 novel coronavirus disease (COVID-19) 11/06/2019   Cervical radiculopathy 10/28/2019   Volume overload 06/25/2019   Degenerative disc disease, cervical 04/02/2019   Spinal stenosis of lumbosacral region 08/28/2018   Primary osteoarthritis of both shoulders 02/12/2018   Rotator cuff arthropathy of both shoulders 02/12/2018   Lumbar degenerative disc disease 01/25/2018   Chronic midline low back pain without sciatica 12/15/2017   History of colon polyps 08/15/2016   Mild episode of recurrent major depressive disorder ([place]) 05/30/2016   Vitamin D deficiency 04/18/2016   Chronic insomnia 12/21/2015   Pulmonary hypertension, mild ([place]) 04/29/2014   Chronic gastritis without bleeding 01/13/2014   Diabetic polyneuropathy associated with type 2 diabetes mellitus ([place]) 01/13/2014   Chronic diastolic heart failure ([place]) 12/24/2013   OSA on CPAP 12/24/2013   Primary osteoarthritis of both knees 08/26/2013   Status post total bilateral knee replacement 05/02/2013   Mixed hyperlipidemia 07/30/2012   Class 3 severe obesity due to excess calories with serious comorbidity and body mass index (BMI) of 45.0 to 49.9 in adult ([place]) 07/27/2012   Gastroesophageal reflux disease without esophagitis 05/12/2008   Erectile dysfunction due to diseases classified elsewhere 01/19/2005    REFERRING DIAG: L AKA  THERAPY DIAG:  Other abnormalities of gait and mobility  Unsteadiness on feet  Muscle weakness (generalized)  Abnormal posture  PERTINENT HISTORY: COPD (chronic obstructive pulmonary disease) (CMS-HCC) 05/04/2021   DDD (degenerative disc disease), lumbosacral   Depression  09/28/2009   Diabetes mellitus type 2, uncomplicated (CMS-HCC)   Dialysis patient (CMS-HCC)   Family hx of colon cancer 08/26/2013   GERD (gastroesophageal reflux disease) 11/28/2008   Headache(784.0)   Heart disease 11/28/2010   Heart murmur, unspecified   History of myocardial infarction   History of pancreatitis 08/26/2013   Hyperlipemia 07/30/2012   Hyperlipidemia LDL goal < 100 07/30/2012   hypertension with heart failure(402.91) 07/27/2012   Kidney disease, stage 4   Lumbar degenerative disc disease   Lumbosacral radiculopathy at L5 03/27/2017   OSA on CPAP  -OA  PRECAUTIONS: Fall (shoulder replacement 09/14/2021, R knee needs to be replaced per pt report)  SUBJECTIVE: Pt reports he gets his power w/c on Thursday.   PAIN:  Are you having pain? Yes: NPRS scale: 4/10 Pain location: "all over", shoulder, right knee specifically Pain description: ache Aggravating factors: arthritis, recent right shoulder replacement Relieving factors: rest, "deal with it"   TODAY'S TREATMENT:  CURRENT PROSTHETIC WEAR ASSESSMENT: Patient is independent with: residual limb care Patient is dependent with: correct ply sock adjustment, proper wear schedule/adjustment, proper weight-bearing schedule/adjustment, and donning liner/socket Donning prosthesis: Max A and Total A Doffing prosthesis: Mod A Prosthetic wear tolerance: about once a week which is at therapy.  Residual limb condition:  intact per patient report Prosthetic description:   Prosthetic training:   PT able to find bucket that was the correct diameter to try and invert liner over and have pt work on sinking down inside of liner then rolling up remainder of the way.  PT allowed him to work on inverting over bucket x 2 reps with PT only stabilizing bucket and providing cues to ensure once he places limb in, its lined up with the middle.  He is able to get liner on bucket, however it takes a lot of time and energy for him to complete.   Once inverted on bucket, PT/pt problem solving on which height to place bucket on to get RW around so that he may safely stand and place limb in bucket to allow liner to roll up onto his limb.  The first try, we utilized wooden box from gym and he was able to get liner on, but then needed to transfer to mat to lie down and finish rolling up.  This was very difficult and again he was able to do it but with maximal fatigue and effort.  We tried doing this again, however we did not get him limb lined up appropriately and he was unable to get it rolled up on leg either.    PT then called to speak with Gerald Stabs at [facility] to discuss future plans.  He reports he has a very long wait list and would take months to get him in but recommends either [facility] or [facility] and getting to prescription.  PT did call and leave message with clinical person at [facility] for Dr. Laurence Aly as pt sees this MD next Tuesday.  Reported that he needs perscription for skin suction socket and also that this needs to be in note for face to face.  PT also relayed this info to the patient so he may also tell him when he sees him.  Did educate for him to come next week to see if he got these things and we can schedule him with Mort[facility] Fries.  Pt verbalized understanding.             HOME EXERCISE PROGRAM: Access Code: [email or web address] URL: [email or web address] Date: 04/28/2022 Prepared by: Willow Ora  Exercises - Wide Stance with Counter Support  - 1 x daily - 5 x weekly - 1 sets - 10 reps - Side to Side Weight Shift with Overhead Reach and Counter Support  - 1 x daily - 5 x weekly - 1 sets - 10 reps        GOALS: Goals reviewed with patient? Yes   SHORT TERM GOALS: Target date: 06/16/2022    Pt will be IND with initial HEP in order to indicate improved functional mobility and dec fall risk. Baseline:  Goal status: not met, not initiated   2.  Will assess gait speed will improve >=0.60 ft/sec w/ RW in order to indicate dec fall  risk.   Baseline: 0.29 ft/s (0/52ms) with BRW and CGA 04/19/22 Goal status: progressing  3.  Pt will ambulate 100' with RW and prosthesis at min A level in order to indicate more independent household ambulation.    Baseline: 568 with BRW 04/19/22 Goal status: met, but needs 3 seated rest breaks   4.  Pt will be able to don/doff prosthesis at S level and wear for 4 hours per day without skin issues and have understanding of sock ply adjustment with min cuing in order to indicate improved wear tolerance.  Baseline:  Goal status: not met, only wearing when "he wants to" and when he has help at home.  Daughter did help him don liner prior to our session.    5.  Pt will perform standing with single UE support x 5 mins, reaching 5" forward, and towards the ground with single UE support.   Baseline:  Goal status: not assessed    6.  Will assess TUG and write LTG to reflect progress.  Baseline:  Goal status: Not assessed    LONG TERM GOALS: Target date: 07/12/2022     Pt will be IND with final HEP in order to indicate improved functional mobility and dec fall risk. Baseline:  Goal status: INITIAL   2.  Pt will improve gait speed by >/=1.0 ft/sec from baseline w/ RW in order to indicate dec fall risk.   Baseline: 0.29 ft/sec with BRW and CGA (04/19/22) Goal status: Progressing continue   3.  Pt will participate in BERG balance test when able to better assess balance.   Baseline: 5/56 (04/19/22) Goal status: progressing   4.  Pt will be able to don/doff prosthesis at mod I level, tolerate wearing prosthesis 8 hours per day without skin issues, and have understanding of sock ply adjustment without cuing.   Baseline: pt is inconsistent with wear schedule 04/19/22 Goal status: Progressing   5.  Pt will ambulate x 300' over indoor and outdoor unlevel paved surfaces with RW and prosthesis at S level in order to indicate improved independence.   Baseline: 69' before needing break  (04/19/22) Goal status: progressing   6.  Pt will negotiate up/down 1 step with RW and up/down curb/ramp with RW at S level in order to enter/exit home safety.   Baseline:  Goal status: INITIAL   ASSESSMENT:   CLINICAL IMPRESSION: Skilled session focused on trying to don liner with use of 8" diameter planter pot, however this was too small.  Had lengthy discussion with pt and wife about his lack of progress due to inability to don liner and wear prosthesis at home.  PT to try and find something bigger for next session, otherwise, we may need to D/C.        OBJECTIVE IMPAIRMENTS Abnormal gait, cardiopulmonary status limiting activity, decreased activity tolerance, decreased balance, decreased endurance, decreased knowledge of use of DME, decreased mobility, difficulty walking, decreased ROM, decreased strength, impaired perceived functional ability, impaired flexibility, impaired UE functional use, postural dysfunction, prosthetic dependency , and obesity.    ACTIVITY LIMITATIONS cleaning, community activity, and driving.    PERSONAL FACTORS 3+ comorbidities: DMII, dialysis, R TKR, R shoulder replacement, polyneuropathy, OSA, obesity  are also affecting patient's functional outcome.      REHAB POTENTIAL: Good   CLINICAL DECISION MAKING: Evolving/moderate complexity   EVALUATION COMPLEXITY: Moderate   PLAN: PT FREQUENCY: 2x/week   PT DURATION: 12 weeks   PLANNED INTERVENTIONS: Therapeutic exercises, Therapeutic activity, Neuromuscular re-education, Balance training, Gait training, Patient/Family education, Stair training, Prosthetic training, DME instructions, Aquatic Therapy, and Manual therapy   PLAN FOR NEXT SESSION:  If he got order for new prosthesis and face to face, call Bobby at [facility] to get him in with him as Gerald Stabs has a very long wait list.  Once this is complete, lets place him on hold until this is taken care of.      Cameron Sprang, PT, MPT [facility] [address] [place], [place], [address] Phone: [phone]   Fax:  [phone] 06/07/22,  3:02 PM

## 2022-06-14 ENCOUNTER — Ambulatory Visit: Payer: Medicare Other | Admitting: Rehabilitation

## 2022-06-14 DIAGNOSIS — R2689 Other abnormalities of gait and mobility: Secondary | ICD-10-CM

## 2022-06-14 NOTE — Therapy (Signed)
[facility] [address] [place], [place], [address] Phone: [phone]   Fax:  [phone]  Patient Details  Name: Jeffrey [name] MRN: [id number] Date of Birth: 1960/12/04 Referring Provider:  No ref. provider found  Encounter Date: 06/14/2022   Pt arrived to session with order for new skin suction suspension as discussed in previous session.  He also reports that MD was aware he needed to mention it in his note.  He did not bring prosthesis and only came to provide order for new prosthesis.  PT then called [facility] and made appt for him on 8/1 at 11am.  Pt notified of this and order and face sheet faxed over.  PTA to be on lookout for MD's note to post so that face to face can also be sent to [facility].  Pt verbalized understanding to all.     Cameron Sprang, PT, MPT [facility] [address] [place], [place], [address] Phone: [phone]   Fax:  [phone] 06/14/22, 5:25 PM

## 2022-06-21 ENCOUNTER — Encounter: Payer: Medicare Other | Admitting: Rehabilitation

## 2022-06-28 ENCOUNTER — Encounter: Payer: Medicare Other | Admitting: Rehabilitation

## 2022-07-12 DIAGNOSIS — M8430XA Stress fracture, unspecified site, initial encounter for fracture: Secondary | ICD-10-CM | POA: Insufficient documentation

## 2022-07-15 DIAGNOSIS — Z4802 Encounter for removal of sutures: Secondary | ICD-10-CM | POA: Insufficient documentation

## 2022-07-20 ENCOUNTER — Encounter (HOSPITAL_COMMUNITY): Payer: Self-pay | Admitting: Emergency Medicine

## 2022-07-20 ENCOUNTER — Other Ambulatory Visit: Payer: Self-pay

## 2022-07-20 ENCOUNTER — Emergency Department (HOSPITAL_COMMUNITY)
Admission: EM | Admit: 2022-07-20 | Discharge: 2022-07-21 | Disposition: A | Discharge status: Home / Self Care | Payer: Medicare Other | Attending: Emergency Medicine | Admitting: Emergency Medicine

## 2022-07-20 ENCOUNTER — Emergency Department (HOSPITAL_COMMUNITY): Payer: Medicare Other

## 2022-07-20 DIAGNOSIS — J449 Chronic obstructive pulmonary disease, unspecified: Secondary | ICD-10-CM | POA: Insufficient documentation

## 2022-07-20 DIAGNOSIS — I509 Heart failure, unspecified: Secondary | ICD-10-CM | POA: Diagnosis not present

## 2022-07-20 DIAGNOSIS — R2231 Localized swelling, mass and lump, right upper limb: Secondary | ICD-10-CM | POA: Insufficient documentation

## 2022-07-20 DIAGNOSIS — I132 Hypertensive heart and chronic kidney disease with heart failure and with stage 5 chronic kidney disease, or end stage renal disease: Secondary | ICD-10-CM | POA: Insufficient documentation

## 2022-07-20 DIAGNOSIS — M79601 Pain in right arm: Secondary | ICD-10-CM | POA: Insufficient documentation

## 2022-07-20 DIAGNOSIS — N186 End stage renal disease: Secondary | ICD-10-CM | POA: Diagnosis not present

## 2022-07-20 DIAGNOSIS — E1122 Type 2 diabetes mellitus with diabetic chronic kidney disease: Secondary | ICD-10-CM | POA: Insufficient documentation

## 2022-07-20 LAB — CBC WITH DIFFERENTIAL/PLATELET
Abs Immature Granulocytes: 0.06 10*3/uL (ref 0.00–0.07)
Basophils Absolute: 0 10*3/uL (ref 0.0–0.1)
Basophils Relative: 0 %
Eosinophils Absolute: 0.2 10*3/uL (ref 0.0–0.5)
Eosinophils Relative: 2 %
HCT: 35.9 % — ABNORMAL LOW (ref 39.0–52.0)
Hemoglobin: 11.7 g/dL — ABNORMAL LOW (ref 13.0–17.0)
Immature Granulocytes: 1 %
Lymphocytes Relative: 11 %
Lymphs Abs: 0.8 10*3/uL (ref 0.7–4.0)
MCH: 29 pg (ref 26.0–34.0)
MCHC: 32.6 g/dL (ref 30.0–36.0)
MCV: 88.9 fL (ref 80.0–100.0)
Monocytes Absolute: 0.6 10*3/uL (ref 0.1–1.0)
Monocytes Relative: 8 %
Neutro Abs: 5.9 10*3/uL (ref 1.7–7.7)
Neutrophils Relative %: 78 %
Platelets: 148 10*3/uL — ABNORMAL LOW (ref 150–400)
RBC: 4.04 MIL/uL — ABNORMAL LOW (ref 4.22–5.81)
RDW: 15.2 % (ref 11.5–15.5)
WBC: 7.6 10*3/uL (ref 4.0–10.5)
nRBC: 0 % (ref 0.0–0.2)

## 2022-07-20 LAB — COMPREHENSIVE METABOLIC PANEL
ALT: 14 U/L (ref 0–44)
AST: 17 U/L (ref 15–41)
Albumin: 3.6 g/dL (ref 3.5–5.0)
Alkaline Phosphatase: 63 U/L (ref 38–126)
Anion gap: 14 (ref 5–15)
BUN: 94 mg/dL — ABNORMAL HIGH (ref 8–23)
CO2: 29 mmol/L (ref 22–32)
Calcium: 7.8 mg/dL — ABNORMAL LOW (ref 8.9–10.3)
Chloride: 93 mmol/L — ABNORMAL LOW (ref 98–111)
Creatinine, Ser: 4.6 mg/dL — ABNORMAL HIGH (ref 0.61–1.24)
GFR, Estimated: 14 mL/min — ABNORMAL LOW (ref >60)
Glucose, Bld: 261 mg/dL — ABNORMAL HIGH (ref 70–99)
Potassium: 3 mmol/L — ABNORMAL LOW (ref 3.5–5.1)
Sodium: 136 mmol/L (ref 135–145)
Total Bilirubin: 0.4 mg/dL (ref 0.3–1.2)
Total Protein: 7.2 g/dL (ref 6.5–8.1)

## 2022-07-20 LAB — TROPONIN I (HIGH SENSITIVITY): Troponin I (High Sensitivity): 19 ng/L — ABNORMAL HIGH (ref <18)

## 2022-07-20 LAB — LACTIC ACID, PLASMA: Lactic Acid, Venous: 1.3 mmol/L (ref 0.5–1.9)

## 2022-07-20 NOTE — ED Provider Triage Note (Signed)
Emergency Medicine Provider Triage Evaluation Note  Jeffrey [name] , a 61 y.o. male  was evaluated in triage.  Pt complains of right upper extremity edema and pain.  Patient states pain radiates through chest throughout left arm.  Patient has a fistula in right arm.  No fever or chills.  No history of DVT.  Patient has a history of ESRD on dialysis Monday/Wednesday/Friday.  Last dialysis was Monday.  Review of Systems  Positive: edema Negative: fever  Physical Exam  BP 136/61 (BP Location: Left Arm)   Pulse 88   Temp 99 F (37.2 C) (Oral)   Resp 20   SpO2 94%  Gen:   Awake, no distress   Resp:  Normal effort  MSK:   Moves extremities without difficulty  Other:  R radial pulse intact. Edematous right arm  Medical Decision Making  Medically screening exam initiated at 8:35 PM.  Appropriate orders placed.  Tysin Salada was informed that the remainder of the evaluation will be completed by another provider, this initial triage assessment does not replace that evaluation, and the importance of remaining in the ED until their evaluation is complete.  Labs, [place] to rule out DVT   Karie Kirks 07/20/22 2037

## 2022-07-20 NOTE — ED Triage Notes (Signed)
Pt here with pain and swelling to right arm, pain radiates through chest to left arm. Dialysis pt, fistula in right arm, pain and swelling worse after treatments. Missed dialysis today.

## 2022-07-21 ENCOUNTER — Telehealth: Payer: Self-pay

## 2022-07-21 DIAGNOSIS — M79601 Pain in right arm: Secondary | ICD-10-CM | POA: Diagnosis not present

## 2022-07-21 LAB — TROPONIN I (HIGH SENSITIVITY): Troponin I (High Sensitivity): 21 ng/L — ABNORMAL HIGH (ref <18)

## 2022-07-21 MED ORDER — OXYCODONE HCL 5 MG PO TABS
5.0000 mg | ORAL_TABLET | Freq: Once | ORAL | Status: AC
  Administered 2022-07-21: 5 mg via ORAL
  Filled 2022-07-21: qty 1

## 2022-07-21 NOTE — Telephone Encounter (Signed)
Records received from [facility].  Dr. Donzetta Matters reviewed records  OK to schedule a duplex and visit with Dr. Donzetta Matters on 8/30.  Scheduled with patient for 07/27/22.

## 2022-07-21 NOTE — ED Provider Notes (Signed)
Emergency Department Provider Note   I have reviewed the triage vital signs and the nursing notes.   HISTORY  Chief Complaint Arm Pain   HPI Jeffrey [name] is a 61 y.o. male with PMH of DM, HTN, COPD, and ESRD with RUE graft presents to the ED with right arm pain and swelling. Patient's last HD session was on Monday. He tells me that he had a fistulogram on Monday with reports angioplasty and had routine HD that same day. He reports pain during the HD session and arm swelling to approximately twice its current size. No fever or chills. He continued to have swelling and pain today and so skipped HD today and presented here for evaluation. Pain in the right arm does radiate to the chest as well. He feels volume overloaded subjectively with some worsening orthopnea.   Past Medical History:  Diagnosis Date   Arthritis    Blood transfusion without reported diagnosis    CHF (congestive heart failure) (HCC)    COPD (chronic obstructive pulmonary disease) (HCC)    Diabetes mellitus without complication (HCC)    Hypertension    PTSD (post-traumatic stress disorder)    Renal disorder    Stage 5 Kidney Failure    Review of Systems  Constitutional: No fever/chills Eyes: No visual changes. ENT: No sore throat. Cardiovascular: Denies chest pain. Positive right arm swelling.  Respiratory: Denies shortness of breath. Gastrointestinal: No abdominal pain.  No nausea, no vomiting.  No diarrhea.  No constipation. Genitourinary: Negative for dysuria. Musculoskeletal: Negative for back pain. Skin: Negative for rash. Neurological: Negative for headaches, focal weakness or numbness.   ____________________________________________   PHYSICAL EXAM:  VITAL SIGNS: ED Triage Vitals [07/20/22 1947]  Enc Vitals Group     BP 136/61     Pulse Rate 88     Resp 20     Temp 99 F (37.2 C)     Temp Source Oral     SpO2 94 %   Constitutional: Alert and oriented. Well appearing and in no acute  distress. Eyes: Conjunctivae are normal. Head: Atraumatic. Nose: No congestion/rhinnorhea. Mouth/Throat: Mucous membranes are moist.   Neck: No stridor.   Cardiovascular: Normal rate, regular rhythm. Good peripheral circulation. Grossly normal heart sounds.   Respiratory: Normal respiratory effort.  No retractions. Lungs CTAB. Gastrointestinal: Soft and nontender. No distention.  Musculoskeletal: Right upper extremity swelling noted in relation to the left but no overlying skin changes, blistering, erythema.  No warmth to touch.  Neurologic:  Normal speech and language.  Skin:  Skin is warm, dry and intact. No rash noted.   ____________________________________________   LABS (all labs ordered are listed, but only abnormal results are displayed)  Labs Reviewed  CBC WITH DIFFERENTIAL/PLATELET - Abnormal; Notable for the following components:      Result Value   RBC 4.04 (*)    Hemoglobin 11.7 (*)    HCT 35.9 (*)    Platelets 148 (*)    All other components within normal limits  COMPREHENSIVE METABOLIC PANEL - Abnormal; Notable for the following components:   Potassium 3.0 (*)    Chloride 93 (*)    Glucose, Bld 261 (*)    BUN 94 (*)    Creatinine, Ser 4.60 (*)    Calcium 7.8 (*)    GFR, Estimated 14 (*)    All other components within normal limits  TROPONIN I (HIGH SENSITIVITY) - Abnormal; Notable for the following components:   Troponin I (High Sensitivity) 19 (*)  All other components within normal limits  TROPONIN I (HIGH SENSITIVITY) - Abnormal; Notable for the following components:   Troponin I (High Sensitivity) 21 (*)    All other components within normal limits  LACTIC ACID, PLASMA  LACTIC ACID, PLASMA   ____________________________________________  EKG   EKG Interpretation  Date/Time:  Wednesday July 20 2022 20:31:57 EDT Ventricular Rate:  88 PR Interval:  150 QRS Duration: 82 QT Interval:  408 QTC Calculation: 493 R Axis:   96 Text  Interpretation: Sinus rhythm with occasional Premature ventricular complexes Rightward axis Prolonged QT Abnormal ECG When compared with ECG of 24-Dec-2021 23:43, PREVIOUS ECG IS PRESENT Confirmed by Nanda Quinton [phone]) on 07/21/2022 1:09:04 AM        ____________________________________________  RADIOLOGY  DG Chest 1 View  Result Date: 07/20/2022 CLINICAL DATA:  Chest pain. EXAM: CHEST  1 VIEW COMPARISON:  12/25/2021 FINDINGS: Previous dialysis catheter is been removed. Upper normal heart size. Stable mediastinal contours. Minimal vascular congestion without pulmonary edema. No large pleural effusion. No focal airspace disease. No pneumothorax. Reverse right shoulder arthroplasty. IMPRESSION: Upper normal heart size with vascular congestion. Electronically Signed   By: Keith Rake M.D.   On: 07/20/2022 21:48    ____________________________________________   PROCEDURES  Procedure(s) performed:   Procedures  None  ____________________________________________   INITIAL IMPRESSION / ASSESSMENT AND PLAN / ED COURSE  Pertinent labs & imaging results that were available during my care of the patient were reviewed by me and considered in my medical decision making (see chart for details).   This patient is Presenting for Evaluation of arm swelling and pain, which does require a range of treatment options, and is a complaint that involves a high risk of morbidity and mortality.  The Differential Diagnoses include fistula stenosis, infection, DVT, fluid overload, etc.   I decided to review pertinent External Data, and in summary patient admitted in December with chest discomfort and fluid overload.  Had fistulogram at that time and stenting with Dr. Donzetta Matters.    Clinical Laboratory Tests Ordered, included mild troponin elevation to 19 less concerning in the setting of end-stage renal disease.  No severe anemia or leukocytosis.  Lactic acid normal.  Radiologic Tests Ordered, included  CXR. I independently interpreted the images and agree with radiology interpretation.   Cardiac Monitor Tracing which shows NSR.    Social Determinants of Health Risk patient is not an active smoker.   Consult complete with Vascular Surgery Dr. Trula Slade.  We reviewed the case by phone.  Recommending wrapping the right upper extremity with an Ace wrap to help reduce swelling and instruct the patient to elevate.  I am not seeing evidence on my exam to suggest acute infection.  I also do not see an indication for emergent dialysis or lab abnormality which would require admission.  Patient can follow-up with his vascular team as an outpatient.  Will also give contact information for our vascular surgery team who can see him in the office for close follow up.   Medical Decision Making: Summary:  Patient presents to the emergency department for evaluation of right arm swelling. No evidence of infection. No clear indication for emergent HD. No pulmonary edema on CXR. No hypoxemia.   Reevaluation with update and discussion with patient. Second troponin not significantly up-trending. ACE wrap applied. Plan discussed with patient including vascular surgery follow up and outpatient HD along with ED return precautions.   Considered admission but labs do not show indication for emergent HD  and outpatient follow up plan arranged with Vascular Surgery on call.   Disposition: discharge  ____________________________________________  FINAL CLINICAL IMPRESSION(S) / ED DIAGNOSES  Final diagnoses:  Right arm pain    Note:  This document was prepared using Dragon voice recognition software and may include unintentional dictation errors.  Nanda Quinton, MD, [facility]    Yahye Siebert, [facility], MD 07/21/22 [phone]

## 2022-07-21 NOTE — Telephone Encounter (Signed)
Pt's d/c summary from yesterday's ED visit says to f/u with (Dr. Trula Slade).  He had a fistulogram by [facility] on Monday 6/21 - he says the doctor "opened up" the artery a little at that time.  He has swelling and pain after dialysis.  His dialysis days are M-W-F.  His arm is currently wrapped and he was given oxycodone.  I asked him to call [facility] for records to be faxed and images sent to [place].  Pt is aware that records will be reviewed and appointments scheduled.

## 2022-07-21 NOTE — Discharge Instructions (Signed)
You were seen in the emergency room today with right arm swelling and pain.  After discussion with vascular surgery they would like you to apply compression to the arm and keep it elevated to reduce swelling.  Please follow with vascular surgery this week.

## 2022-07-26 ENCOUNTER — Other Ambulatory Visit: Payer: Self-pay | Admitting: *Deleted

## 2022-07-26 DIAGNOSIS — N186 End stage renal disease: Secondary | ICD-10-CM

## 2022-07-27 ENCOUNTER — Ambulatory Visit (INDEPENDENT_AMBULATORY_CARE_PROVIDER_SITE_OTHER): Payer: Medicare Other | Admitting: Vascular Surgery

## 2022-07-27 ENCOUNTER — Encounter: Payer: Self-pay | Admitting: Vascular Surgery

## 2022-07-27 ENCOUNTER — Other Ambulatory Visit: Payer: Self-pay

## 2022-07-27 ENCOUNTER — Ambulatory Visit (HOSPITAL_COMMUNITY)
Admission: RE | Admit: 2022-07-27 | Discharge: 2022-07-27 | Disposition: A | Discharge status: Home / Self Care | Payer: Medicare Other | Source: Ambulatory Visit | Attending: Vascular Surgery | Admitting: Vascular Surgery

## 2022-07-27 VITALS — BP 155/75 | HR 85 | Temp 98.2°F | Resp 20 | Ht 65.0 in | Wt 275.0 lb

## 2022-07-27 DIAGNOSIS — N186 End stage renal disease: Secondary | ICD-10-CM

## 2022-07-27 DIAGNOSIS — Z992 Dependence on renal dialysis: Secondary | ICD-10-CM

## 2022-07-27 NOTE — H&P (View-Only) (Signed)
Patient ID: Jeffrey [name], male   DOB: [birth year], 61 y.o.   MRN: [id number]  Reason for Consult: Follow-up   Referred by No ref. provider found  Subjective:     HPI:  Jeffrey [name] is a 61 y.o. male history of right upper extremity graft placement for dialysis in August 2022.  Previously had axillary loop graft placed at [facility].  There was significant swelling in the right upper extremity and subsequent underwent stenting of the innominate subclavian junction.  He now has worsening swelling over the past couple weeks.  States that he has not been to dialysis for several days given the discomfort in the right upper extremity.  Swelling is not as bad as prior to previous intervention but it has been difficult for him to drive his wheelchair.  Past Medical History:  Diagnosis Date   Arthritis    Blood transfusion without reported diagnosis    CHF (congestive heart failure) (HCC)    COPD (chronic obstructive pulmonary disease) (HCC)    Diabetes mellitus without complication (HCC)    Hypertension    PTSD (post-traumatic stress disorder)    Renal disorder    Stage 5 Kidney Failure   History reviewed. No pertinent family history. Past Surgical History:  Procedure Laterality Date   A/V FISTULAGRAM N/A 08/30/2021   Procedure: A/V FISTULAGRAM;  Surgeon: Waynetta Sandy, MD;  Location: [place] [facility];  Service: Cardiovascular;  Laterality: N/A;   A/V SHUNTOGRAM N/A 11/08/2021   Procedure: A/V SHUNTOGRAM;  Surgeon: Waynetta Sandy, MD;  Location: [place] [facility];  Service: Cardiovascular;  Laterality: N/A;   AMPUTATION Left 04/03/2021   Procedure: LEFT ABOVE-THE-KNEE AMPUTATION;  Surgeon: Erle Crocker, MD;  Location: [place];  Service: Orthopedics;  Laterality: Left;   AV FISTULA PLACEMENT Right 08/12/2021   Procedure: INSERTION OF ARTERIOVENOUS (AV) GORE-TEX GRAFT RIGHT ARM;  Surgeon: Serafina Mitchell, MD;  Location: [place];  Service: Vascular;  Laterality: Right;    IR REMOVAL TUN CV CATH W/O FL  03/30/2021   JOINT REPLACEMENT     Bilateral knees   PERIPHERAL VASCULAR BALLOON ANGIOPLASTY Right 08/30/2021   Procedure: PERIPHERAL VASCULAR BALLOON ANGIOPLASTY;  Surgeon: Waynetta Sandy, MD;  Location: [place] [facility];  Service: Cardiovascular;  Laterality: Right;   PERIPHERAL VASCULAR INTERVENTION  11/08/2021   Procedure: PERIPHERAL VASCULAR INTERVENTION;  Surgeon: Waynetta Sandy, MD;  Location: [place] [facility];  Service: Cardiovascular;;  Central rt arm fistula   UPPER EXTREMITY VENOGRAPHY Right 08/10/2021   Procedure: UPPER EXTREMITY VENOGRAPHY;  Surgeon: Serafina Mitchell, MD;  Location: [place] [facility];  Service: Cardiovascular;  Laterality: Right;    Short Social History:  Social History   Tobacco Use   Smoking status: Former    Types: Cigarettes    Quit date: 2012    Years since quitting: 11.6   Smokeless tobacco: Never  Substance Use Topics   Alcohol use: Yes    Comment: occasionally    Allergies  Allergen Reactions   Bupropion Swelling   Dexmedetomidine Nausea And Vomiting    Other reaction(s): Other (See Comments) Dose-limiting bradycardia Dose-limiting bradycardia    Ibuprofen Shortness Of Breath and Swelling    Pt tolerates aspirin Pt tolerates aspirin    Pioglitazone Anaphylaxis and Rash   Tomato Anaphylaxis    Only allergic to RAW tomatoes    Current Outpatient Medications  Medication Sig Dispense Refill   ACCU-CHEK GUIDE test strip 4 (four) times daily.  acetaminophen (TYLENOL) 325 MG tablet Take 2 tablets (650 mg total) by mouth every 6 (six) hours as needed for moderate pain. 20 tablet 0   albuterol (PROVENTIL) (2.5 MG/3ML) 0.083% nebulizer solution Inhale 2.5 mg into the lungs every 8 (eight) hours as needed for wheezing or shortness of breath.     albuterol (VENTOLIN HFA) 108 (90 Base) MCG/ACT inhaler Inhale 1-2 puffs into the lungs every 6 (six) hours as needed for wheezing or shortness  of breath.     amLODipine (NORVASC) 10 MG tablet Take 10 mg by mouth at bedtime.     ASPIRIN LOW DOSE 81 MG EC tablet Take 81 mg by mouth daily.     atorvastatin (LIPITOR) 40 MG tablet Take 40 mg by mouth daily.     calcium acetate (PHOSLO) 667 MG capsule Take 2 capsules (1,334 mg total) by mouth with breakfast, with lunch, and with evening meal.     carvedilol (COREG) 12.5 MG tablet Take 12.5 mg by mouth 2 (two) times daily with a meal.     Cholecalciferol 50 MCG (2000 UT) CAPS Take 2,000 Units by mouth daily.     Continuous Blood Gluc Receiver (DEXCOM G6 RECEIVER) DEVI Use 1 Device continuously     cyclobenzaprine (FLEXERIL) 10 MG tablet Take 1 tablet (10 mg total) by mouth 2 (two) times daily as needed for muscle spasms. 20 tablet 0   diazepam (VALIUM) 5 MG tablet Take 5 mg by mouth 2 (two) times daily as needed.     diphenhydrAMINE (BENADRYL) 25 MG tablet Take 50 mg by mouth every 6 (six) hours as needed for allergies.     glucagon 1 MG injection Inject 1 mg into the muscle as needed (low blood sugar).     insulin regular human CONCENTRATED (HUMULIN R U-500 KWIKPEN) 500 UNIT/ML KwikPen Inject 80-140 Units into the skin 2 (two) times daily with a meal. For dialysis days give 80 units in am, if tray comes early to go to dialysis early may given medication 1-2 hours earlier, page MD for questions - If BG < 70, treat per hypoglycemia protocol. - If BG < 70 or NPO or any other dosing concerns, page MD. Note this insulin is 5 times as concentrated as regular insulin U-100. Verify dose and volume prior to administration. 0   CONCENTRATED insulin REGULAR (HUMULIN R U-500 "CONCENTRATED") 500 unit/mL injection 140 Units, Subcutaneous, User specified (Once per day on Mon Wed Fri Sun).  Instructions:   Non-Dialysis days give 140 units in the am - If NPO, hold dose. - If BG < 70, treat per hypoglycemia protocol. - If BG < 70 or any other dosing concerns, page MD. Note this insulin is 5 times as concentrated  as regular insulin U-100. Verify dose and volume prior to administration. 0     lidocaine (LIDODERM) 5 % Place 3 patches onto the skin daily. Remove & Discard patch within 12 hours or as directed by MD 30 patch 0   metolazone (ZAROXOLYN) 10 MG tablet Take 10 mg by mouth daily.     Nutritional Supplements (,FEEDING SUPPLEMENT, PROSOURCE PLUS) liquid Take 30 mLs by mouth 2 (two) times daily between meals.     oxyCODONE (ROXICODONE) 5 MG immediate release tablet Take 1 tablet (5 mg total) by mouth every 6 (six) hours as needed for up to 10 doses for breakthrough pain. 10 tablet 0   oxyCODONE-acetaminophen (PERCOCET/ROXICET) 5-325 MG tablet Take 1 tablet by mouth every 6 (six) hours as needed for  moderate pain or severe pain. 20 tablet 0   pantoprazole (PROTONIX) 20 MG tablet Take 20 mg by mouth daily.     polyethylene glycol powder (GLYCOLAX/MIRALAX) 17 GM/SCOOP powder Take 17 g by mouth daily as needed for mild constipation.     pregabalin (LYRICA) 25 MG capsule Take 1 capsule (25 mg total) by mouth 2 (two) times daily.     senna-docusate (SENOKOT-S) 8.6-50 MG tablet Take 2 tablets by mouth 2 (two) times daily as needed for mild constipation.     sertraline (ZOLOFT) 100 MG tablet Take 100 mg by mouth daily.     tamsulosin (FLOMAX) 0.4 MG CAPS capsule Take 1 capsule (0.4 mg total) by mouth daily. 30 capsule 0   torsemide (DEMADEX) 100 MG tablet Take 1 tablet (100 mg total) by mouth every Tuesday, Thursday, Saturday, and Sunday.     traZODone (DESYREL) 150 MG tablet Take 150 mg by mouth at bedtime.     insulin regular human CONCENTRATED (HUMULIN R U-500 KWIKPEN) 500 UNIT/ML KwikPen Inject 45 Units into the skin daily with supper. (Patient not taking: Reported on 11/07/2021) 2.7 mL 0   insulin regular human CONCENTRATED (HUMULIN R U-500 KWIKPEN) 500 UNIT/ML KwikPen Inject 60 Units into the skin daily with breakfast. (Patient taking differently: Inject 100-125 Units into the skin See admin instructions. 125  units with breakfast 100 units in the evening) 3.6 mL 0   nortriptyline (PAMELOR) 10 MG capsule Take 20 mg by mouth at bedtime.     No current facility-administered medications for this visit.    Review of Systems  Constitutional:  Constitutional negative. HENT: HENT negative.  Eyes: Eyes negative.  Respiratory: Respiratory negative.  Cardiovascular: Cardiovascular negative.  GI: Gastrointestinal negative.  Musculoskeletal:       Right upper extremity swelling Skin: Skin negative.  Neurological: Neurological negative. Hematologic: Hematologic/lymphatic negative.  Psychiatric: Psychiatric negative.        Objective:  Objective   Vitals:   07/27/22 1100  BP: (!) 155/75  Pulse: 85  Resp: 20  Temp: 98.2 F (36.8 C)  SpO2: (!) 88%  Weight: 275 lb (124.7 kg)  Height: 5\' 5"  (1.651 m)   Body mass index is 45.76 kg/m.  Physical Exam HENT:     Head: Normocephalic.  Eyes:     Pupils: Pupils are equal, round, and reactive to light.  Cardiovascular:     Comments: Cannot reliably feel right radial or ulnar pulse due to swelling in the right hand and wrist Pulmonary:     Effort: Pulmonary effort is normal.  Abdominal:     General: Abdomen is flat.  Musculoskeletal:     Comments: Moderate nonpitting edema right upper extremity extending into the wrist and hand  Neurological:     General: No focal deficit present.     Mental Status: He is alert.  Psychiatric:        Mood and Affect: Mood normal.     Data: AVG                PSV (cm/s)    Flow Vol               Describe                                               (mL/min)                                      +-------------------+----------+----------------+--------------------------  ----+  Native artery         331                                                      inflow                                                                          +-------------------+----------+----------------+--------------------------  ----+  Arterial              627                                                      anastomosis                                                                    +-------------------+----------+----------------+--------------------------  ----+  Prox graft            645                                                      +-------------------+----------+----------------+--------------------------  ----+  Mid graft             203                                                      +-------------------+----------+----------------+--------------------------  ----+  Distal graft          219                     Internal echoes along  graft                                                              wall                +-------------------+----------+----------------+--------------------------  ----+  Venous anastomosis    186                                                      +-------------------+----------+----------------+--------------------------  ----+  Venous outflow         80                                                      +-------------------+----------+----------------+--------------------------  ----+       Summary:  Patent arteriovenous graft without evidence of perigraft fluid.      Assessment/Plan:    61 year old male on dialysis via right arm AV graft with significant swelling in the right upper extremity status post stenting of the right innominate vein.  I recommended he go to dialysis given that has been multiple days and we will get him scheduled in the next day or 2 for right upper extremity fistulogram with possible reintervention of the stent or whenever outflow obstruction is causing his swelling.  He demonstrates good understanding     Waynetta Sandy MD [facility] [place]

## 2022-07-27 NOTE — Progress Notes (Signed)
Patient ID: Jeffrey [name], male   DOB: Apr 24, 1961, 61 y.o.   MRN: [id number]  Reason for Consult: Follow-up   Referred by No ref. provider found  Subjective:     HPI:  Jeffrey [name] is a 61 y.o. male history of right upper extremity graft placement for dialysis in August 2022.  Previously had axillary loop graft placed at [facility].  There was significant swelling in the right upper extremity and subsequent underwent stenting of the innominate subclavian junction.  He now has worsening swelling over the past couple weeks.  States that he has not been to dialysis for several days given the discomfort in the right upper extremity.  Swelling is not as bad as prior to previous intervention but it has been difficult for him to drive his wheelchair.  Past Medical History:  Diagnosis Date   Arthritis    Blood transfusion without reported diagnosis    CHF (congestive heart failure) (HCC)    COPD (chronic obstructive pulmonary disease) (HCC)    Diabetes mellitus without complication (HCC)    Hypertension    PTSD (post-traumatic stress disorder)    Renal disorder    Stage 5 Kidney Failure   History reviewed. No pertinent family history. Past Surgical History:  Procedure Laterality Date   A/V FISTULAGRAM N/A 08/30/2021   Procedure: A/V FISTULAGRAM;  Surgeon: Waynetta Sandy, MD;  Location: [place] [facility];  Service: Cardiovascular;  Laterality: N/A;   A/V SHUNTOGRAM N/A 11/08/2021   Procedure: A/V SHUNTOGRAM;  Surgeon: Waynetta Sandy, MD;  Location: [place] [facility];  Service: Cardiovascular;  Laterality: N/A;   AMPUTATION Left 04/03/2021   Procedure: LEFT ABOVE-THE-KNEE AMPUTATION;  Surgeon: Erle Crocker, MD;  Location: [place];  Service: Orthopedics;  Laterality: Left;   AV FISTULA PLACEMENT Right 08/12/2021   Procedure: INSERTION OF ARTERIOVENOUS (AV) GORE-TEX GRAFT RIGHT ARM;  Surgeon: Serafina Mitchell, MD;  Location: [place];  Service: Vascular;  Laterality: Right;    IR REMOVAL TUN CV CATH W/O FL  03/30/2021   JOINT REPLACEMENT     Bilateral knees   PERIPHERAL VASCULAR BALLOON ANGIOPLASTY Right 08/30/2021   Procedure: PERIPHERAL VASCULAR BALLOON ANGIOPLASTY;  Surgeon: Waynetta Sandy, MD;  Location: [place] [facility];  Service: Cardiovascular;  Laterality: Right;   PERIPHERAL VASCULAR INTERVENTION  11/08/2021   Procedure: PERIPHERAL VASCULAR INTERVENTION;  Surgeon: Waynetta Sandy, MD;  Location: [place] [facility];  Service: Cardiovascular;;  Central rt arm fistula   UPPER EXTREMITY VENOGRAPHY Right 08/10/2021   Procedure: UPPER EXTREMITY VENOGRAPHY;  Surgeon: Serafina Mitchell, MD;  Location: [place] [facility];  Service: Cardiovascular;  Laterality: Right;    Short Social History:  Social History   Tobacco Use   Smoking status: Former    Types: Cigarettes    Quit date: 2012    Years since quitting: 11.6   Smokeless tobacco: Never  Substance Use Topics   Alcohol use: Yes    Comment: occasionally    Allergies  Allergen Reactions   Bupropion Swelling   Dexmedetomidine Nausea And Vomiting    Other reaction(s): Other (See Comments) Dose-limiting bradycardia Dose-limiting bradycardia    Ibuprofen Shortness Of Breath and Swelling    Pt tolerates aspirin Pt tolerates aspirin    Pioglitazone Anaphylaxis and Rash   Tomato Anaphylaxis    Only allergic to RAW tomatoes    Current Outpatient Medications  Medication Sig Dispense Refill   ACCU-CHEK GUIDE test strip 4 (four) times daily.  acetaminophen (TYLENOL) 325 MG tablet Take 2 tablets (650 mg total) by mouth every 6 (six) hours as needed for moderate pain. 20 tablet 0   albuterol (PROVENTIL) (2.5 MG/3ML) 0.083% nebulizer solution Inhale 2.5 mg into the lungs every 8 (eight) hours as needed for wheezing or shortness of breath.     albuterol (VENTOLIN HFA) 108 (90 Base) MCG/ACT inhaler Inhale 1-2 puffs into the lungs every 6 (six) hours as needed for wheezing or shortness  of breath.     amLODipine (NORVASC) 10 MG tablet Take 10 mg by mouth at bedtime.     ASPIRIN LOW DOSE 81 MG EC tablet Take 81 mg by mouth daily.     atorvastatin (LIPITOR) 40 MG tablet Take 40 mg by mouth daily.     calcium acetate (PHOSLO) 667 MG capsule Take 2 capsules (1,334 mg total) by mouth with breakfast, with lunch, and with evening meal.     carvedilol (COREG) 12.5 MG tablet Take 12.5 mg by mouth 2 (two) times daily with a meal.     Cholecalciferol 50 MCG (2000 UT) CAPS Take 2,000 Units by mouth daily.     Continuous Blood Gluc Receiver (DEXCOM G6 RECEIVER) DEVI Use 1 Device continuously     cyclobenzaprine (FLEXERIL) 10 MG tablet Take 1 tablet (10 mg total) by mouth 2 (two) times daily as needed for muscle spasms. 20 tablet 0   diazepam (VALIUM) 5 MG tablet Take 5 mg by mouth 2 (two) times daily as needed.     diphenhydrAMINE (BENADRYL) 25 MG tablet Take 50 mg by mouth every 6 (six) hours as needed for allergies.     glucagon 1 MG injection Inject 1 mg into the muscle as needed (low blood sugar).     insulin regular human CONCENTRATED (HUMULIN R U-500 KWIKPEN) 500 UNIT/ML KwikPen Inject 80-140 Units into the skin 2 (two) times daily with a meal. For dialysis days give 80 units in am, if tray comes early to go to dialysis early may given medication 1-2 hours earlier, page MD for questions - If BG < 70, treat per hypoglycemia protocol. - If BG < 70 or NPO or any other dosing concerns, page MD. Note this insulin is 5 times as concentrated as regular insulin U-100. Verify dose and volume prior to administration. 0   CONCENTRATED insulin REGULAR (HUMULIN R U-500 "CONCENTRATED") 500 unit/mL injection 140 Units, Subcutaneous, User specified (Once per day on Mon Wed Fri Sun).  Instructions:   Non-Dialysis days give 140 units in the am - If NPO, hold dose. - If BG < 70, treat per hypoglycemia protocol. - If BG < 70 or any other dosing concerns, page MD. Note this insulin is 5 times as concentrated  as regular insulin U-100. Verify dose and volume prior to administration. 0     lidocaine (LIDODERM) 5 % Place 3 patches onto the skin daily. Remove & Discard patch within 12 hours or as directed by MD 30 patch 0   metolazone (ZAROXOLYN) 10 MG tablet Take 10 mg by mouth daily.     Nutritional Supplements (,FEEDING SUPPLEMENT, PROSOURCE PLUS) liquid Take 30 mLs by mouth 2 (two) times daily between meals.     oxyCODONE (ROXICODONE) 5 MG immediate release tablet Take 1 tablet (5 mg total) by mouth every 6 (six) hours as needed for up to 10 doses for breakthrough pain. 10 tablet 0   oxyCODONE-acetaminophen (PERCOCET/ROXICET) 5-325 MG tablet Take 1 tablet by mouth every 6 (six) hours as needed for  moderate pain or severe pain. 20 tablet 0   pantoprazole (PROTONIX) 20 MG tablet Take 20 mg by mouth daily.     polyethylene glycol powder (GLYCOLAX/MIRALAX) 17 GM/SCOOP powder Take 17 g by mouth daily as needed for mild constipation.     pregabalin (LYRICA) 25 MG capsule Take 1 capsule (25 mg total) by mouth 2 (two) times daily.     senna-docusate (SENOKOT-S) 8.6-50 MG tablet Take 2 tablets by mouth 2 (two) times daily as needed for mild constipation.     sertraline (ZOLOFT) 100 MG tablet Take 100 mg by mouth daily.     tamsulosin (FLOMAX) 0.4 MG CAPS capsule Take 1 capsule (0.4 mg total) by mouth daily. 30 capsule 0   torsemide (DEMADEX) 100 MG tablet Take 1 tablet (100 mg total) by mouth every Tuesday, Thursday, Saturday, and Sunday.     traZODone (DESYREL) 150 MG tablet Take 150 mg by mouth at bedtime.     insulin regular human CONCENTRATED (HUMULIN R U-500 KWIKPEN) 500 UNIT/ML KwikPen Inject 45 Units into the skin daily with supper. (Patient not taking: Reported on 11/07/2021) 2.7 mL 0   insulin regular human CONCENTRATED (HUMULIN R U-500 KWIKPEN) 500 UNIT/ML KwikPen Inject 60 Units into the skin daily with breakfast. (Patient taking differently: Inject 100-125 Units into the skin See admin instructions. 125  units with breakfast 100 units in the evening) 3.6 mL 0   nortriptyline (PAMELOR) 10 MG capsule Take 20 mg by mouth at bedtime.     No current facility-administered medications for this visit.    Review of Systems  Constitutional:  Constitutional negative. HENT: HENT negative.  Eyes: Eyes negative.  Respiratory: Respiratory negative.  Cardiovascular: Cardiovascular negative.  GI: Gastrointestinal negative.  Musculoskeletal:       Right upper extremity swelling Skin: Skin negative.  Neurological: Neurological negative. Hematologic: Hematologic/lymphatic negative.  Psychiatric: Psychiatric negative.        Objective:  Objective   Vitals:   07/27/22 1100  BP: (!) 155/75  Pulse: 85  Resp: 20  Temp: 98.2 F (36.8 C)  SpO2: (!) 88%  Weight: 275 lb (124.7 kg)  Height: 5\' 5"  (1.651 m)   Body mass index is 45.76 kg/m.  Physical Exam HENT:     Head: Normocephalic.  Eyes:     Pupils: Pupils are equal, round, and reactive to light.  Cardiovascular:     Comments: Cannot reliably feel right radial or ulnar pulse due to swelling in the right hand and wrist Pulmonary:     Effort: Pulmonary effort is normal.  Abdominal:     General: Abdomen is flat.  Musculoskeletal:     Comments: Moderate nonpitting edema right upper extremity extending into the wrist and hand  Neurological:     General: No focal deficit present.     Mental Status: He is alert.  Psychiatric:        Mood and Affect: Mood normal.     Data: AVG                PSV (cm/s)    Flow Vol               Describe                                               (mL/min)                                      +-------------------+----------+----------------+--------------------------  ----+  Native artery         331                                                      inflow                                                                          +-------------------+----------+----------------+--------------------------  ----+  Arterial              627                                                      anastomosis                                                                    +-------------------+----------+----------------+--------------------------  ----+  Prox graft            645                                                      +-------------------+----------+----------------+--------------------------  ----+  Mid graft             203                                                      +-------------------+----------+----------------+--------------------------  ----+  Distal graft          219                     Internal echoes along  graft                                                              wall                +-------------------+----------+----------------+--------------------------  ----+  Venous anastomosis    186                                                      +-------------------+----------+----------------+--------------------------  ----+  Venous outflow         80                                                      +-------------------+----------+----------------+--------------------------  ----+       Summary:  Patent arteriovenous graft without evidence of perigraft fluid.      Assessment/Plan:    61 year old male on dialysis via right arm AV graft with significant swelling in the right upper extremity status post stenting of the right innominate vein.  I recommended he go to dialysis given that has been multiple days and we will get him scheduled in the next day or 2 for right upper extremity fistulogram with possible reintervention of the stent or whenever outflow obstruction is causing his swelling.  He demonstrates good understanding     Waynetta Sandy MD [facility] [place]

## 2022-07-28 ENCOUNTER — Encounter (HOSPITAL_COMMUNITY)
Admission: RE | Disposition: A | Discharge status: Home / Self Care | Payer: Self-pay | Source: Home / Self Care | Attending: Vascular Surgery

## 2022-07-28 ENCOUNTER — Other Ambulatory Visit: Payer: Self-pay

## 2022-07-28 ENCOUNTER — Ambulatory Visit (HOSPITAL_COMMUNITY)
Admission: RE | Admit: 2022-07-28 | Discharge: 2022-07-28 | Disposition: A | Discharge status: Home / Self Care | Payer: Medicare Other | Attending: Vascular Surgery | Admitting: Vascular Surgery

## 2022-07-28 DIAGNOSIS — Z992 Dependence on renal dialysis: Secondary | ICD-10-CM | POA: Insufficient documentation

## 2022-07-28 DIAGNOSIS — Y841 Kidney dialysis as the cause of abnormal reaction of the patient, or of later complication, without mention of misadventure at the time of the procedure: Secondary | ICD-10-CM | POA: Insufficient documentation

## 2022-07-28 DIAGNOSIS — I132 Hypertensive heart and chronic kidney disease with heart failure and with stage 5 chronic kidney disease, or end stage renal disease: Secondary | ICD-10-CM | POA: Diagnosis not present

## 2022-07-28 DIAGNOSIS — N186 End stage renal disease: Secondary | ICD-10-CM | POA: Diagnosis not present

## 2022-07-28 DIAGNOSIS — E1122 Type 2 diabetes mellitus with diabetic chronic kidney disease: Secondary | ICD-10-CM | POA: Insufficient documentation

## 2022-07-28 DIAGNOSIS — I509 Heart failure, unspecified: Secondary | ICD-10-CM | POA: Insufficient documentation

## 2022-07-28 DIAGNOSIS — N185 Chronic kidney disease, stage 5: Secondary | ICD-10-CM

## 2022-07-28 DIAGNOSIS — T82858A Stenosis of vascular prosthetic devices, implants and grafts, initial encounter: Secondary | ICD-10-CM | POA: Insufficient documentation

## 2022-07-28 DIAGNOSIS — T82898A Other specified complication of vascular prosthetic devices, implants and grafts, initial encounter: Secondary | ICD-10-CM

## 2022-07-28 HISTORY — PX: A/V FISTULAGRAM: CATH118298

## 2022-07-28 HISTORY — PX: PERIPHERAL VASCULAR BALLOON ANGIOPLASTY: CATH118281

## 2022-07-28 LAB — POCT I-STAT, CHEM 8
BUN: >130 mg/dL — ABNORMAL HIGH (ref 8–23)
Calcium, Ion: 0.84 mmol/L — CL (ref 1.15–1.40)
Chloride: 89 mmol/L — ABNORMAL LOW (ref 98–111)
Creatinine, Ser: 4.8 mg/dL — ABNORMAL HIGH (ref 0.61–1.24)
Glucose, Bld: 156 mg/dL — ABNORMAL HIGH (ref 70–99)
HCT: 35 % — ABNORMAL LOW (ref 39.0–52.0)
Hemoglobin: 11.9 g/dL — ABNORMAL LOW (ref 13.0–17.0)
Potassium: 2.7 mmol/L — CL (ref 3.5–5.1)
Sodium: 136 mmol/L (ref 135–145)
TCO2: 34 mmol/L — ABNORMAL HIGH (ref 22–32)

## 2022-07-28 LAB — GLUCOSE, CAPILLARY: Glucose-Capillary: 177 mg/dL — ABNORMAL HIGH (ref 70–99)

## 2022-07-28 SURGERY — A/V FISTULAGRAM
Anesthesia: LOCAL | Laterality: Right

## 2022-07-28 MED ORDER — IODIXANOL 320 MG/ML IV SOLN
INTRAVENOUS | Status: DC | PRN
  Administered 2022-07-28: 50 mL

## 2022-07-28 MED ORDER — SODIUM CHLORIDE 0.9% FLUSH: 3.0000 mL | INTRAVENOUS | Status: DC | PRN

## 2022-07-28 MED ORDER — HEPARIN (PORCINE) IN NACL 1000-0.9 UT/500ML-% IV SOLN
INTRAVENOUS | Status: DC | PRN
  Administered 2022-07-28: 500 mL

## 2022-07-28 MED ORDER — SODIUM CHLORIDE 0.9 % IV SOLN: 250.0000 mL | INTRAVENOUS | Status: DC | PRN

## 2022-07-28 MED ORDER — LIDOCAINE HCL (PF) 1 % IJ SOLN
INTRAMUSCULAR | Status: AC
  Filled 2022-07-28: qty 30

## 2022-07-28 MED ORDER — SODIUM CHLORIDE 0.9% FLUSH: 3.0000 mL | Freq: Two times a day (BID) | INTRAVENOUS | Status: DC

## 2022-07-28 MED ORDER — LIDOCAINE HCL (PF) 1 % IJ SOLN
INTRAMUSCULAR | Status: DC | PRN
  Administered 2022-07-28: 5 mL

## 2022-07-28 MED ORDER — HEPARIN (PORCINE) IN NACL 1000-0.9 UT/500ML-% IV SOLN
INTRAVENOUS | Status: AC
  Filled 2022-07-28: qty 500

## 2022-07-28 SURGICAL SUPPLY — 20 items
BAG SNAP BAND KOVER 36X36 (MISCELLANEOUS) ×1 IMPLANT
BALLN ATLAS 12X40X75 (BALLOONS) ×1
BALLN LUTONIX AV 9X60X75 (BALLOONS) ×1
BALLN MUSTANG 10.0X40 75 (BALLOONS) ×1
BALLOON ATLAS 12X40X75 (BALLOONS) IMPLANT
BALLOON LUTONIX AV 9X60X75 (BALLOONS) IMPLANT
BALLOON MUSTANG 10.0X40 75 (BALLOONS) IMPLANT
CATH ANGIO 5F BER2 65CM (CATHETERS) IMPLANT
COVER DOME SNAP 22 D (MISCELLANEOUS) ×1 IMPLANT
KIT ENCORE 26 ADVANTAGE (KITS) IMPLANT
KIT MICROPUNCTURE NIT STIFF (SHEATH) IMPLANT
PROTECTION STATION PRESSURIZED (MISCELLANEOUS) ×1
SHEATH PINNACLE R/O II 7F 4CM (SHEATH) IMPLANT
SHEATH PROBE COVER 6X72 (BAG) IMPLANT
STATION PROTECTION PRESSURIZED (MISCELLANEOUS) ×1 IMPLANT
STOPCOCK MORSE 400PSI 3WAY (MISCELLANEOUS) ×1 IMPLANT
TRAY PV CATH (CUSTOM PROCEDURE TRAY) ×1 IMPLANT
TUBING CIL FLEX 10 FLL-RA (TUBING) ×1 IMPLANT
WIRE BENTSON .035X145CM (WIRE) IMPLANT
WIRE ROSEN-J .035X180CM (WIRE) IMPLANT

## 2022-07-28 NOTE — Interval H&P Note (Signed)
History and Physical Interval Note:  07/28/2022 7:22 AM  Jeffrey [name]  has presented today for surgery, with the diagnosis of instage renal.  The various methods of treatment have been discussed with the patient and family. After consideration of risks, benefits and other options for treatment, the patient has consented to  Procedure(s): A/V Fistulagram (Right) as a surgical intervention.  The patient's history has been reviewed, patient examined, no change in status, stable for surgery.  I have reviewed the patient's chart and labs.  Questions were answered to the patient's satisfaction.     Servando Snare

## 2022-07-28 NOTE — Op Note (Signed)
    Patient name: Jeffrey [name] MRN: [id number] DOB: [birth year] Sex: male  07/28/2022 Pre-operative Diagnosis: End-stage renal disease, right upper arm swelling Post-operative diagnosis:  Same Surgeon:  Eda Paschal. Donzetta Matters, MD Procedure Performed: 1.  Ultrasound-guided cannulation right arm AV graft 2.  Right upper extremity fistulogram 3.  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  He has had issues with edema in the past and we have placed a stent in his right innominate vein which was a 10 x 25 mm self-expanding stent.  He now has recurrent swelling in the right upper extremity is indicated for fistulogram with possible intervention.  Findings: There is greater than 80% stenosis within the stent and then proximally in the subclavian vein.  After 9 mm drug-coated balloon angioplasty with the balloon inflated to 10 mm we then postdilated with a 12 mm balloon and reduce the stenosis to left than 20% and there was improved thrill within the graft and compressive Ace wrap was placed on the patient's right upper extremity.   Procedure:  The patient was identified in the holding area and taken to room 8.  The patient was then placed supine on the table and prepped and draped in the usual sterile fashion.  A time out was called.  Ultrasound was used to evaluate the right arm AV graft.  There was surrounding edema within the subcutaneous tissue.  The area was anesthetized with 1% lidocaine and I cannulated the graft with a micropuncture needle by wire sheath.  And images saved the permanent record.  Right upper extremity shuntogram pictures were performed.  We then placed a Bentson wire followed by 7 [place] short sheath.  We then crossed the stenosis and confirmed intraluminal access with Berenstein catheter and placed a wire centrally.  We then first dilated with a 10  mm balloon and then 9 mm drug-coated balloon inflated to almost 10 mm at 13 atm.  Completion demonstrated some residual stenosis we then dilated with a 12 mm balloon but given that this was at the thoracic outlet I elected no further stenting.  At completion there is less than 20% residual stenosis and improved thrill throughout the graft.  Satisfied with this we remove the wire and the sheath and suture-ligated the cannulation site.  He did tolerate procedure without any complication.  Contrast: 50cc   Alawna Graybeal C. Donzetta Matters, MD [facility] Office: [phone] Pager: [phone]

## 2022-07-29 ENCOUNTER — Encounter (HOSPITAL_COMMUNITY): Payer: Self-pay | Admitting: Vascular Surgery

## 2022-08-11 ENCOUNTER — Telehealth: Payer: Self-pay

## 2022-08-11 DIAGNOSIS — N186 End stage renal disease: Secondary | ICD-10-CM

## 2022-08-11 NOTE — Telephone Encounter (Signed)
Pt called stating that he was again having swelling after dialysis and thinks he needs to be seen again.  Called HD center to get a report on HD treatment that pt received yesterday. They stated that he completed the treatment with no complaints.  Reviewed pt's chart, returned call for clarification, two identifiers used. Pt stated that the treatment went well, but when the staff took the needles out, he felt significant pain and swelling. He alerted the staff at the time, but felt that they did not listen to his complaints. However, they did tell him to use ice and elevation. Pt has been doing this with little to no change.  Spoke with Gwenette Greet, [place] who advised that the pt continue what he's been doing and get appts scheduled for next week to have AVG checked. Appts scheduled as a work-in. Pt called, no answer, lf vm detailing appt dates and times and instructed to call if that would not work with his HD schedule.

## 2022-08-15 ENCOUNTER — Encounter (HOSPITAL_COMMUNITY): Payer: Self-pay

## 2022-08-15 ENCOUNTER — Other Ambulatory Visit: Payer: Self-pay

## 2022-08-15 ENCOUNTER — Emergency Department (HOSPITAL_COMMUNITY)
Admission: EM | Admit: 2022-08-15 | Discharge: 2022-08-15 | Disposition: A | Discharge status: Home / Self Care | Payer: Medicare Other | Attending: Emergency Medicine | Admitting: Emergency Medicine

## 2022-08-15 ENCOUNTER — Emergency Department (HOSPITAL_COMMUNITY): Payer: Medicare Other

## 2022-08-15 ENCOUNTER — Ambulatory Visit (HOSPITAL_COMMUNITY): Payer: Medicare Other

## 2022-08-15 ENCOUNTER — Ambulatory Visit: Payer: Medicare Other

## 2022-08-15 DIAGNOSIS — N186 End stage renal disease: Secondary | ICD-10-CM | POA: Insufficient documentation

## 2022-08-15 DIAGNOSIS — E876 Hypokalemia: Secondary | ICD-10-CM | POA: Insufficient documentation

## 2022-08-15 DIAGNOSIS — H538 Other visual disturbances: Secondary | ICD-10-CM | POA: Insufficient documentation

## 2022-08-15 DIAGNOSIS — Z79899 Other long term (current) drug therapy: Secondary | ICD-10-CM | POA: Diagnosis not present

## 2022-08-15 DIAGNOSIS — Z794 Long term (current) use of insulin: Secondary | ICD-10-CM | POA: Insufficient documentation

## 2022-08-15 DIAGNOSIS — Z7982 Long term (current) use of aspirin: Secondary | ICD-10-CM | POA: Insufficient documentation

## 2022-08-15 DIAGNOSIS — R062 Wheezing: Secondary | ICD-10-CM | POA: Insufficient documentation

## 2022-08-15 DIAGNOSIS — W19XXXA Unspecified fall, initial encounter: Secondary | ICD-10-CM

## 2022-08-15 DIAGNOSIS — R600 Localized edema: Secondary | ICD-10-CM | POA: Insufficient documentation

## 2022-08-15 DIAGNOSIS — I120 Hypertensive chronic kidney disease with stage 5 chronic kidney disease or end stage renal disease: Secondary | ICD-10-CM | POA: Diagnosis not present

## 2022-08-15 DIAGNOSIS — D649 Anemia, unspecified: Secondary | ICD-10-CM | POA: Insufficient documentation

## 2022-08-15 DIAGNOSIS — Z992 Dependence on renal dialysis: Secondary | ICD-10-CM | POA: Insufficient documentation

## 2022-08-15 DIAGNOSIS — E1122 Type 2 diabetes mellitus with diabetic chronic kidney disease: Secondary | ICD-10-CM | POA: Insufficient documentation

## 2022-08-15 DIAGNOSIS — J449 Chronic obstructive pulmonary disease, unspecified: Secondary | ICD-10-CM | POA: Insufficient documentation

## 2022-08-15 DIAGNOSIS — M542 Cervicalgia: Secondary | ICD-10-CM | POA: Insufficient documentation

## 2022-08-15 DIAGNOSIS — R011 Cardiac murmur, unspecified: Secondary | ICD-10-CM | POA: Insufficient documentation

## 2022-08-15 LAB — CBC WITH DIFFERENTIAL/PLATELET
Abs Immature Granulocytes: 0.16 10*3/uL — ABNORMAL HIGH (ref 0.00–0.07)
Basophils Absolute: 0 10*3/uL (ref 0.0–0.1)
Basophils Relative: 0 %
Eosinophils Absolute: 0.2 10*3/uL (ref 0.0–0.5)
Eosinophils Relative: 2 %
HCT: 36.4 % — ABNORMAL LOW (ref 39.0–52.0)
Hemoglobin: 11.6 g/dL — ABNORMAL LOW (ref 13.0–17.0)
Immature Granulocytes: 2 %
Lymphocytes Relative: 8 %
Lymphs Abs: 0.8 10*3/uL (ref 0.7–4.0)
MCH: 28.8 pg (ref 26.0–34.0)
MCHC: 31.9 g/dL (ref 30.0–36.0)
MCV: 90.3 fL (ref 80.0–100.0)
Monocytes Absolute: 0.5 10*3/uL (ref 0.1–1.0)
Monocytes Relative: 5 %
Neutro Abs: 8.1 10*3/uL — ABNORMAL HIGH (ref 1.7–7.7)
Neutrophils Relative %: 83 %
Platelets: UNDETERMINED 10*3/uL (ref 150–400)
RBC: 4.03 MIL/uL — ABNORMAL LOW (ref 4.22–5.81)
RDW: 14.6 % (ref 11.5–15.5)
WBC: 9.8 10*3/uL (ref 4.0–10.5)
nRBC: 0 % (ref 0.0–0.2)

## 2022-08-15 LAB — BASIC METABOLIC PANEL
Anion gap: 19 — ABNORMAL HIGH (ref 5–15)
BUN: 97 mg/dL — ABNORMAL HIGH (ref 8–23)
CO2: 26 mmol/L (ref 22–32)
Calcium: 8.3 mg/dL — ABNORMAL LOW (ref 8.9–10.3)
Chloride: 94 mmol/L — ABNORMAL LOW (ref 98–111)
Creatinine, Ser: 4.68 mg/dL — ABNORMAL HIGH (ref 0.61–1.24)
GFR, Estimated: 13 mL/min — ABNORMAL LOW (ref >60)
Glucose, Bld: 123 mg/dL — ABNORMAL HIGH (ref 70–99)
Potassium: 3.3 mmol/L — ABNORMAL LOW (ref 3.5–5.1)
Sodium: 139 mmol/L (ref 135–145)

## 2022-08-15 LAB — TROPONIN I (HIGH SENSITIVITY): Troponin I (High Sensitivity): 19 ng/L — ABNORMAL HIGH (ref <18)

## 2022-08-15 MED ORDER — OXYCODONE-ACETAMINOPHEN 5-325 MG PO TABS
1.0000 | ORAL_TABLET | Freq: Once | ORAL | Status: AC
  Administered 2022-08-15: 1 via ORAL
  Filled 2022-08-15: qty 1

## 2022-08-15 NOTE — ED Provider Notes (Addendum)
[facility] EMERGENCY DEPARTMENT Provider Note   CSN: [id number] Arrival date & time: 08/15/22  1348     History  Chief Complaint  Patient presents with   Jeffrey [name]    Kara Mierzejewski is a 61 y.o. male who presents via EMS after fall.  States that an Melburn Popper was picking him up to go to Dr. Donzetta Matters, vascular surgeon's office for evaluation of his AV fistula, when as he was rolling backwards towards the car in his wheelchair hit a tree tipped backwards and struck the back of his head on the concrete.  Very brief LOC but no nausea vomiting since that time.  Endorses some blurry vision intermittently since then.  Endorses pain in the neck and down his back, endorses soreness in his ribs as well.  I have personally reviewed His medical record.  History of ESRD on HD Monday/Wednesday/Friday, last dialysis session Wednesday of last week, 1 week ago.  States he has not gone since then because he cannot access his AV fistula and it swells and he wanted to see Dr. Donzetta Matters before having dialysis again.  In addition the above history is history of COPD, hypertension, type 2 diabetes, OSA on CPAP.  HPI     Home Medications Prior to Admission medications   Medication Sig Start Date End Date Taking? Authorizing Provider  ACCU-CHEK GUIDE test strip 4 (four) times daily. 11/15/20   [provider]  acetaminophen (TYLENOL) 325 MG tablet Take 2 tablets (650 mg total) by mouth every 6 (six) hours as needed for moderate pain. 04/14/21   Raiford Noble Latif, DO  albuterol (PROVENTIL) (2.5 MG/3ML) 0.083% nebulizer solution Inhale 2.5 mg into the lungs every 8 (eight) hours as needed for wheezing or shortness of breath.    [provider]  albuterol (VENTOLIN HFA) 108 (90 Base) MCG/ACT inhaler Inhale 1-2 puffs into the lungs every 6 (six) hours as needed for wheezing or shortness of breath.    [provider]  amLODipine (NORVASC) 10 MG tablet Take 10 mg by mouth every morning.  04/17/20   [provider]  ASPIRIN LOW DOSE 81 MG EC tablet Take 81 mg by mouth daily. 02/22/21   [provider]  atorvastatin (LIPITOR) 40 MG tablet Take 40 mg by mouth daily. 02/09/21   [provider]  calcium acetate (PHOSLO) 667 MG capsule Take 2 capsules (1,334 mg total) by mouth with breakfast, with lunch, and with evening meal. 04/10/21   Debbe Odea, MD  carvedilol (COREG) 12.5 MG tablet Take 12.5 mg by mouth 2 (two) times daily with a meal. 04/17/20   [provider]  Cholecalciferol 50 MCG (2000 UT) CAPS Take 2,000 Units by mouth daily. 08/24/20   [provider]  Continuous Blood Gluc Receiver (DEXCOM G6 RECEIVER) DEVI Use 1 Device continuously 03/08/21   [provider]  cyclobenzaprine (FLEXERIL) 10 MG tablet Take 1 tablet (10 mg total) by mouth 2 (two) times daily as needed for muscle spasms. 12/25/21   Curatolo, Adam, DO  diazepam (VALIUM) 5 MG tablet Take 5 mg by mouth every 12 (twelve) hours as needed for muscle spasms. 12/09/21   [provider]  diphenhydrAMINE (BENADRYL) 25 MG tablet Take 50 mg by mouth every 6 (six) hours as needed for allergies.    [provider]  insulin regular human CONCENTRATED (HUMULIN R U-500 KWIKPEN) 500 UNIT/ML KwikPen Inject 45 Units into the skin daily with supper. Patient taking differently: Inject 125 Units into the skin 2 (  two) times daily with a meal. 09/04/21 07/27/22  [name], MD  lidocaine (LIDODERM) 5 % Place 3 patches onto the skin daily. Remove & Discard patch within 12 hours or as directed by MD Patient taking differently: Place 1 patch onto the skin daily as needed (pain). Remove & Discard patch within 12 hours or as directed by MD 08/17/21   Alma Friendly, MD  metolazone (ZAROXOLYN) 10 MG tablet Take 10 mg by mouth daily. 07/13/21   [provider]  nortriptyline (PAMELOR) 10 MG capsule Take 20 mg by mouth at bedtime. 10/23/19 07/27/22  [provider]  Nutritional Supplements (,FEEDING SUPPLEMENT, PROSOURCE PLUS) liquid Take 30 mLs by mouth 2 (two) times daily between meals. Patient taking differently: Take 30 mLs by mouth daily as needed (if needed between meals). 04/11/21   Debbe Odea, MD  oxyCODONE (ROXICODONE) 5 MG immediate release tablet Take 1 tablet (5 mg total) by mouth every 6 (six) hours as needed for up to 10 doses for breakthrough pain. 12/25/21   Curatolo, Adam, DO  oxyCODONE-acetaminophen (PERCOCET/ROXICET) 5-325 MG tablet Take 1 tablet by mouth every 6 (six) hours as needed for moderate pain or severe pain. 08/31/21   Domenic Polite, MD  pantoprazole (PROTONIX) 40 MG tablet Take 40 mg by mouth daily. 02/09/21   [provider]  polyethylene glycol powder (GLYCOLAX/MIRALAX) 17 GM/SCOOP powder Take 17 g by mouth daily as needed for mild constipation. 08/24/20   [provider]  pregabalin (LYRICA) 25 MG capsule Take 1 capsule (25 mg total) by mouth 2 (two) times daily. Patient taking differently: Take 75 mg by mouth daily. 08/17/21   Alma Friendly, MD  senna-docusate (SENOKOT-S) 8.6-50 MG tablet Take 2 tablets by mouth 2 (two) times daily as needed for mild constipation. 10/23/19   [provider]  sertraline (ZOLOFT) 100 MG tablet Take 100 mg by mouth daily. 02/22/21   [provider]  tamsulosin (FLOMAX) 0.4 MG CAPS capsule Take 1 capsule (0.4 mg total) by mouth daily. 11/10/21   Darliss Cheney, MD  torsemide (DEMADEX) 100 MG tablet Take 1 tablet (100 mg total) by mouth every Tuesday, Thursday, Saturday, and Sunday. Patient taking differently: Take 100 mg by mouth 2 (two) times daily. 08/17/21   Alma Friendly, MD  traZODone (DESYREL) 50 MG tablet Take 200 mg by mouth at bedtime. 12/09/21   [provider]      Allergies    Bupropion, Dexmedetomidine, Ibuprofen, Pioglitazone, and Tomato    Review of Systems   Review of Systems  Constitutional: Negative.    HENT: Negative.    Eyes:  Positive for visual disturbance.  Respiratory:  Positive for shortness of breath.   Cardiovascular: Negative.   Gastrointestinal: Negative.   Musculoskeletal:  Positive for back pain and myalgias.  Skin: Negative.   Neurological:  Positive for headaches.    Physical Exam Updated Vital Signs BP (!) 162/60 (BP Location: Left Arm)   Pulse 87   Temp 97.6 F (36.4 C) (Oral)   Resp 20   Ht 5\' 5"  (1.651 m)   Wt 129.7 kg   SpO2 93%   BMI 47.59 kg/m  Physical Exam Vitals and nursing note reviewed.  Constitutional:      Appearance: He is obese. He is not ill-appearing or toxic-appearing.  HENT:     Head: Normocephalic and atraumatic.     Mouth/Throat:     Mouth: Mucous membranes are moist.     Pharynx: No oropharyngeal  exudate or posterior oropharyngeal erythema.  Eyes:     General:        Right eye: No discharge.        Left eye: No discharge.     Conjunctiva/sclera: Conjunctivae normal.  Cardiovascular:     Rate and Rhythm: Normal rate and regular rhythm.     Pulses:          Dorsalis pedis pulses are 1+ on the right side. Left dorsalis pedis pulse not accessible.     Heart sounds: Murmur heard.  Pulmonary:     Effort: Pulmonary effort is normal. No tachypnea, bradypnea, accessory muscle usage, prolonged expiration or respiratory distress.     Breath sounds: Examination of the right-middle field reveals wheezing. Wheezing present. No rales.  Abdominal:     General: Bowel sounds are normal. There is no distension.     Palpations: Abdomen is soft.     Tenderness: There is no abdominal tenderness. There is no right CVA tenderness, left CVA tenderness, guarding or rebound.    Musculoskeletal:        General: No deformity.     Cervical back: Neck supple.     Right lower leg: 2+ Pitting Edema present.  Skin:    General: Skin is warm and dry.     Capillary Refill: Capillary refill takes less than 2 seconds.       Neurological:     General: No  focal deficit present.     Mental Status: He is alert and oriented to person, place, and time. Mental status is at baseline.  Psychiatric:        Mood and Affect: Mood normal.     ED Results / Procedures / Treatments   Labs (all labs ordered are listed, but only abnormal results are displayed) Labs Reviewed  CBC WITH DIFFERENTIAL/PLATELET - Abnormal; Notable for the following components:      Result Value   RBC 4.03 (*)    Hemoglobin 11.6 (*)    HCT 36.4 (*)    Neutro Abs 8.1 (*)    Abs Immature Granulocytes 0.16 (*)    All other components within normal limits  BASIC METABOLIC PANEL - Abnormal; Notable for the following components:   Potassium 3.3 (*)    Chloride 94 (*)    Glucose, Bld 123 (*)    BUN 97 (*)    Creatinine, Ser 4.68 (*)    Calcium 8.3 (*)    GFR, Estimated 13 (*)    Anion gap 19 (*)    All other components within normal limits  TROPONIN I (HIGH SENSITIVITY) - Abnormal; Notable for the following components:   Troponin I (High Sensitivity) 19 (*)    All other components within normal limits  TROPONIN I (HIGH SENSITIVITY)    EKG None  Radiology CT Cervical Spine Wo Contrast  Result Date: 08/15/2022 CLINICAL DATA:  [place], head trauma EXAM: CT CERVICAL SPINE WITHOUT CONTRAST TECHNIQUE: Multidetector CT imaging of the cervical spine was performed without intravenous contrast. Multiplanar CT image reconstructions were also generated. RADIATION DOSE REDUCTION: This exam was performed according to the departmental dose-optimization program which includes automated exposure control, adjustment of the mA and/or kV according to patient size and/or use of iterative reconstruction technique. COMPARISON:  None Available. FINDINGS: Alignment: There is reversal of cervical lordosis with mild kyphosis centered at C4. This may be positional or due to prior C3-C6 laminectomy and posterior fusion. Skull base and vertebrae: No acute fracture. No primary bone lesion or  focal  pathologic process. Soft tissues and spinal canal: No prevertebral fluid or swelling. No visible canal hematoma. Disc levels: Postsurgical changes from C3-C6 laminectomy and posterior fusion. Partial bony fusion across the C3-C6 facet joints, with moderate spondylosis at those levels. There is also mild spondylosis at the C6-7 level. Upper chest: Airway is patent.  Lung apices are clear. Other: Reconstructed images demonstrate no additional findings. IMPRESSION: 1. No acute cervical spine fracture. 2. Postsurgical and degenerative changes from C3-C6 as above. Electronically Signed   By: Randa Ngo M.D.   On: 08/15/2022 16:02   CT HEAD WO CONTRAST (5MM)  Result Date: 08/15/2022 CLINICAL DATA:  Moderate to severe head trauma, fall EXAM: CT HEAD WITHOUT CONTRAST TECHNIQUE: Contiguous axial images were obtained from the base of the skull through the vertex without intravenous contrast. RADIATION DOSE REDUCTION: This exam was performed according to the departmental dose-optimization program which includes automated exposure control, adjustment of the mA and/or kV according to patient size and/or use of iterative reconstruction technique. COMPARISON:  05/25/2022 FINDINGS: Brain: No acute infarct or hemorrhage. Lateral ventricles and midline structures are unremarkable. No acute extra-axial fluid collections. No mass effect. Vascular: No hyperdense vessel or unexpected calcification. Skull: Normal. Negative for fracture or focal lesion. Sinuses/Orbits: No acute finding. Other: None. IMPRESSION: 1. No acute intracranial process. Electronically Signed   By: Randa Ngo M.D.   On: 08/15/2022 16:00   DG Chest 2 View  Result Date: 08/15/2022 CLINICAL DATA:  Fall, chest pain, dialysis. EXAM: CHEST - 2 VIEW COMPARISON:  Chest x-ray 07/20/2022. FINDINGS: Mild streaky bibasilar opacities. No confluent consolidation. No visible pleural effusions or pneumothorax. Cardiomediastinal silhouette is within normal limits and  similar to prior. Right reverse shoulder arthroplasty, partially imaged. Cervical fusion hardware. No evidence of acute osseous abnormality. IMPRESSION: Mild streaky bibasilar opacities, probably atelectasis. Electronically Signed   By: Margaretha Sheffield M.D.   On: 08/15/2022 14:58    Procedures Procedures    Medications Ordered in ED Medications  oxyCODONE-acetaminophen (PERCOCET/ROXICET) 5-325 MG per tablet 1 tablet (has no administration in time range)    ED Course/ Medical Decision Making/ A&P                           Medical Decision Making 61 year old male who presents with concern for mechanical fall today.   HTN on intake, VS otherwise normal. Cardiopulmonary exam with mild wheezing, otherwise unremarkable.  Mild tenderness palpation over the posterior scalp without bruising or erythema, induration, hematoma, or laceration.  Midline tenderness to palpation of the C-spine, bilateral thoracic and lumbar paraspinous musculature spasm.  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    Amount and/or Complexity of Data Reviewed Labs:     Details: CBC without leukocytosis with mild anemia with hemoglobin of 11.6 at baseline.  BMP with hypokalemia 3.3, baseline creatinine.  Mild anion gap, likely related to his renal status.  Troponin of 19 at patient's baseline.   Radiology:     Details: Chest x-ray negative for acute cardiopulmonary disease, CT head and C-spine negative for acute cranial process or traumatic injury to the spine, visualized by this provider.  ECG/medicine tests:     Details:  EKG with sinus rhythm with PVCs but no abnormal intervals.  Risk Prescription drug management.   From fall perspective, no concerning traumatic injury secondary to fall today.  Fall was mechanical in nature.  Patient may have mild concussion given symptoms described but  patient grossly intact, GCS 15, and normal CT are reassuring.  Concussion precautions given.  Laboratories perspective,  patient does not require emergent dialysis.  It was strongly encouraged that he proceed with dialysis as scheduled despite his concern of mild soft tissue swelling at the AV fistula following access.  He does have outpatient follow-up with vascular surgery but has not been directed to abstain from dialysis in the interim.  Case discussed with EDP Dr. Reather Converse, who agrees patient is safe to be discharged home.   No further work-up warranted in the ED at this time.  Patient's daughter updated over the phone per patient request.  She is in the parking lot waiting to collect the patient. Reese and his daughter voiced understanding of her medical evaluation and treatment plan. Each of their questions answered to their expressed satisfaction.  Return precautions were given.  Patient is well-appearing, stable, and was discharged in good condition.  This chart was dictated using voice recognition software, Dragon. Despite the best efforts of this provider to proofread and correct errors, errors may still occur which can change documentation meaning. Final Clinical Impression(s) / ED Diagnoses Final diagnoses:  Fall, initial encounter    Rx / DC Orders ED Discharge Orders     None           Aura Dials 08/15/22 2332    Elnora Morrison, MD 08/16/22 0000

## 2022-08-15 NOTE — ED Triage Notes (Signed)
[place] backwards out of wheelchair.  Hasnt been to dialysis since last Wednesday.  Complains of neck and back pain. Ccollar couldn't be placed due to patients neck size per ems.

## 2022-08-15 NOTE — ED Provider Triage Note (Signed)
Emergency Medicine Provider Triage Evaluation Note  Jeffrey [name] , a 61 y.o. male  was evaluated in triage.  Pt complains of fall and chest pain.  He is going to see Dr. Gwenlyn [name] about his right upper extremity dialysis access.  He subsequently did his wheelchair back we will try to get into an EVAR.  Hit the back of his head.  He has some pain to the back of his head, his neck.  He feels like his vision is blurred.  He also states he has had some chest pain and shortness of breath.  He is supposed to go to dialysis today however did not go, he feels fluid overloaded. Does still make urine  Review of Systems  Positive: CP, fall, HA, neck pain Negative:   Physical Exam  Ht 5\' 5"  (1.651 m)   Wt 129.7 kg   BMI 47.59 kg/m  Gen:   Awake, no distress   Resp:  Normal effort  MSK:   Moves extremities without difficulty  Other:    Medical Decision Making  Medically screening exam initiated at 2:05 PM.  Appropriate orders placed.  Jeffrey [name] was informed that the remainder of the evaluation will be completed by another provider, this initial triage assessment does not replace that evaluation, and the importance of remaining in the ED until their evaluation is complete.  Fall, CP, HA, needs dialysis   Jeffrey [name] A, PA-C 08/15/22 1406

## 2022-08-15 NOTE — Discharge Instructions (Signed)
You are seen in the ER today after your fall.  Your imaging was reassuring.  You may take your previously prescribed pain medication for your soreness and follow-up with your primary care doctor.  It is very important that you attend your regularly scheduled dialysis on Wednesday.  You may call Dr. Claretha Cooper office tomorrow to reschedule your appointment for evaluation of your fistula.  Return to the ER with any severe symptoms.  Please mind the precautions to be discussed for your concussion, you may use Tylenol as needed for your headache.

## 2022-08-23 ENCOUNTER — Ambulatory Visit (HOSPITAL_COMMUNITY)
Admission: RE | Admit: 2022-08-23 | Discharge: 2022-08-23 | Disposition: A | Discharge status: Home / Self Care | Payer: Medicare Other | Source: Ambulatory Visit | Attending: Vascular Surgery | Admitting: Vascular Surgery

## 2022-08-23 DIAGNOSIS — N186 End stage renal disease: Secondary | ICD-10-CM | POA: Diagnosis not present

## 2022-08-23 DIAGNOSIS — Z992 Dependence on renal dialysis: Secondary | ICD-10-CM | POA: Diagnosis not present

## 2022-08-25 ENCOUNTER — Ambulatory Visit (INDEPENDENT_AMBULATORY_CARE_PROVIDER_SITE_OTHER): Payer: Medicare Other | Admitting: Physician Assistant

## 2022-08-25 ENCOUNTER — Other Ambulatory Visit: Payer: Self-pay

## 2022-08-25 VITALS — BP 159/69 | HR 91 | Temp 98.0°F | Resp 20 | Ht 65.0 in

## 2022-08-25 DIAGNOSIS — Z992 Dependence on renal dialysis: Secondary | ICD-10-CM

## 2022-08-25 DIAGNOSIS — N186 End stage renal disease: Secondary | ICD-10-CM

## 2022-08-25 NOTE — H&P (View-Only) (Signed)
Established Dialysis Access   History of Present Illness   Jeffrey [name] is a 61 y.o. (Jan 15, 1961) male who presents evaluation of recurrent right AV graft with pain and swelling after dialysis. He reports no issues with dialysis itself. He denies any bleeding. 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 Donzetta Matters. This was for upper arm swelling. He explains that it improved for a little while but the swelling has come back. He also reports extreme cramping in the right arm after his dialysis sessions. " Feels like my arm is dead". This resolves once he is home and no issues on his non dialysis days.   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     acetaminophen (TYLENOL) 325 MG tablet Take 2 tablets (650 mg total) by mouth every 6 (six) hours as needed for moderate pain. 20 tablet 0   albuterol (PROVENTIL) (2.5 MG/3ML) 0.083% nebulizer solution Inhale 2.5 mg into the lungs every 8 (eight) hours as needed for wheezing or shortness of breath.     albuterol (VENTOLIN HFA) 108 (90 Base) MCG/ACT inhaler Inhale 1-2 puffs into the lungs every 6 (six) hours as needed for wheezing or shortness of breath.     amLODipine (NORVASC) 10 MG tablet Take 10 mg by mouth every morning.     ASPIRIN LOW DOSE 81 MG EC tablet Take 81 mg by mouth daily.     atorvastatin (LIPITOR) 40 MG tablet Take 40 mg by mouth daily.     calcium acetate (PHOSLO) 667 MG capsule Take 2 capsules (1,334 mg total) by mouth with breakfast, with lunch, and with evening meal.     carvedilol (COREG) 12.5 MG tablet Take 12.5 mg by  mouth 2 (two) times daily with a meal.     Cholecalciferol 50 MCG (2000 UT) CAPS Take 2,000 Units by mouth daily.     Continuous Blood Gluc Receiver (DEXCOM G6 RECEIVER) DEVI Use 1 Device continuously     cyclobenzaprine (FLEXERIL) 10 MG tablet Take 1 tablet (10 mg total) by mouth 2 (two) times daily as needed for muscle spasms. 20 tablet 0   diazepam (VALIUM) 5 MG tablet Take 5 mg by mouth every 12 (twelve) hours as needed for muscle spasms.     diphenhydrAMINE (BENADRYL) 25 MG tablet Take 50 mg by mouth every 6 (six) hours as needed for allergies.     HEPARIN SODIUM, PORCINE, IV Heparin Sodium (Porcine) 1,000 Units/mL Systemic     lidocaine (LIDODERM) 5 % Place 3 patches onto the skin daily. Remove & Discard patch within 12 hours or as directed by MD (Patient taking differently: Place 1 patch onto the skin daily as needed (pain). Remove & Discard patch within 12 hours or as directed by MD) 30 patch 0   Methoxy PEG-Epoetin Beta (MIRCERA IJ) Mircera     metolazone (ZAROXOLYN) 10 MG tablet Take 10 mg by mouth daily.     Nutritional Supplements (,FEEDING SUPPLEMENT, PROSOURCE PLUS) liquid Take 30 mLs by mouth 2 (two) times daily between meals. (Patient taking differently: Take 30 mLs by mouth daily as needed (  if needed between meals).)     oxyCODONE (ROXICODONE) 5 MG immediate release tablet Take 1 tablet (5 mg total) by mouth every 6 (six) hours as needed for up to 10 doses for breakthrough pain. 10 tablet 0   oxyCODONE-acetaminophen (PERCOCET/ROXICET) 5-325 MG tablet Take 1 tablet by mouth every 6 (six) hours as needed for moderate pain or severe pain. 20 tablet 0   pantoprazole (PROTONIX) 40 MG tablet Take 40 mg by mouth daily.     polyethylene glycol powder (GLYCOLAX/MIRALAX) 17 GM/SCOOP powder Take 17 g by mouth daily as needed for mild constipation.     pregabalin (LYRICA) 25 MG capsule Take 1 capsule (25 mg total) by mouth 2 (two) times daily. (Patient taking differently: Take 75 mg by mouth  daily.)     pregabalin (LYRICA) 75 MG capsule Take 75 mg by mouth daily.     senna-docusate (SENOKOT-S) 8.6-50 MG tablet Take 2 tablets by mouth 2 (two) times daily as needed for mild constipation.     sertraline (ZOLOFT) 100 MG tablet Take 100 mg by mouth daily.     tamsulosin (FLOMAX) 0.4 MG CAPS capsule Take 1 capsule (0.4 mg total) by mouth daily. 30 capsule 0   torsemide (DEMADEX) 100 MG tablet Take 1 tablet (100 mg total) by mouth every Tuesday, Thursday, Saturday, and Sunday. (Patient taking differently: Take 100 mg by mouth 2 (two) times daily.)     traZODone (DESYREL) 50 MG tablet Take 200 mg by mouth at bedtime.     insulin regular human CONCENTRATED (HUMULIN R U-500 KWIKPEN) 500 UNIT/ML KwikPen Inject 45 Units into the skin daily with supper. (Patient taking differently: Inject 125 Units into the skin 2 (two) times daily with a meal.) 2.7 mL 0   nortriptyline (PAMELOR) 10 MG capsule Take 20 mg by mouth at bedtime.     No current facility-administered medications for this visit.    On ROS today: negative unless stated in HPI   Physical Examination   Vitals:   08/25/22 1019  BP: (!) 159/69  Pulse: 91  Resp: 20  Temp: 98 F (36.7 C)  TempSrc: Temporal  SpO2: 90%  Height: 5\' 5" (1.651 m)   Body mass index is 47.59 kg/m.  General Not in any acute distress  Pulmonary Non labored  Cardiac Regular rate and rhythm   Vascular Vessel Right Left  Radial Palpable Palpable  Brachial Palpable Palpable  Ulnar Not palpable Not palpable    Musculo- skeletal Right upper arm with some swelling. Hard to truly appreciate with patients body habitus.Palpable thrill in graft. Good bruit M/S 5/5 throughout  , Extremities without ischemic changes    Neurologic A&O; CN grossly intact     Non-invasive Vascular Imaging   right Arm Access Duplex  (08/25/22):     +--------------------+----------+-----------------+--------+  AVG                 PSV (cm/s)Flow Vol (mL/min)Describe   +--------------------+----------+-----------------+--------+  Native artery inflow   224           403                 +--------------------+----------+-----------------+--------+  Arterial anastomosis   495                               +--------------------+----------+-----------------+--------+  Prox graft             31 2                               +--------------------+----------+-----------------+--------+  Mid graft              159                               +--------------------+----------+-----------------+--------+  Distal graft           248                               +--------------------+----------+-----------------+--------+  Venous anastomosis     464                               +--------------------+----------+-----------------+--------+  Venous outflow         128                               +--------------------+----------+-----------------+--------+   Summary:  Patent arteriovenous graft.     Medical Decision Making   [place] Chelf is a 61 y.o. male who presents for evaluation of recurrent right upper arm swelling and pain after dialysis. 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 Donzetta Matters. Patient right upper arm AV graft on duplex. 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 Patient is agreeable to proceed. Will arrange fistulogram in the near future with Dr. Donzetta Matters on a non dialysis day.    Karoline Caldwell, PA-C [facility] [place] Office: [phone]  Clinic MD: Yetta Barre

## 2022-08-25 NOTE — Progress Notes (Signed)
Established Dialysis Access   History of Present Illness   Jeffrey [name] is a 61 y.o. (15-May-1961) male who presents evaluation of recurrent right AV graft with pain and swelling after dialysis. He reports no issues with dialysis itself. He denies any bleeding. 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 Donzetta Matters. This was for upper arm swelling. He explains that it improved for a little while but the swelling has come back. He also reports extreme cramping in the right arm after his dialysis sessions. " Feels like my arm is dead". This resolves once he is home and no issues on his non dialysis days.   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     acetaminophen (TYLENOL) 325 MG tablet Take 2 tablets (650 mg total) by mouth every 6 (six) hours as needed for moderate pain. 20 tablet 0   albuterol (PROVENTIL) (2.5 MG/3ML) 0.083% nebulizer solution Inhale 2.5 mg into the lungs every 8 (eight) hours as needed for wheezing or shortness of breath.     albuterol (VENTOLIN HFA) 108 (90 Base) MCG/ACT inhaler Inhale 1-2 puffs into the lungs every 6 (six) hours as needed for wheezing or shortness of breath.     amLODipine (NORVASC) 10 MG tablet Take 10 mg by mouth every morning.     ASPIRIN LOW DOSE 81 MG EC tablet Take 81 mg by mouth daily.     atorvastatin (LIPITOR) 40 MG tablet Take 40 mg by mouth daily.     calcium acetate (PHOSLO) 667 MG capsule Take 2 capsules (1,334 mg total) by mouth with breakfast, with lunch, and with evening meal.     carvedilol (COREG) 12.5 MG tablet Take 12.5 mg by  mouth 2 (two) times daily with a meal.     Cholecalciferol 50 MCG (2000 UT) CAPS Take 2,000 Units by mouth daily.     Continuous Blood Gluc Receiver (DEXCOM G6 RECEIVER) DEVI Use 1 Device continuously     cyclobenzaprine (FLEXERIL) 10 MG tablet Take 1 tablet (10 mg total) by mouth 2 (two) times daily as needed for muscle spasms. 20 tablet 0   diazepam (VALIUM) 5 MG tablet Take 5 mg by mouth every 12 (twelve) hours as needed for muscle spasms.     diphenhydrAMINE (BENADRYL) 25 MG tablet Take 50 mg by mouth every 6 (six) hours as needed for allergies.     HEPARIN SODIUM, PORCINE, IV Heparin Sodium (Porcine) 1,000 Units/mL Systemic     lidocaine (LIDODERM) 5 % Place 3 patches onto the skin daily. Remove & Discard patch within 12 hours or as directed by MD (Patient taking differently: Place 1 patch onto the skin daily as needed (pain). Remove & Discard patch within 12 hours or as directed by MD) 30 patch 0   Methoxy PEG-Epoetin Beta (MIRCERA IJ) Mircera     metolazone (ZAROXOLYN) 10 MG tablet Take 10 mg by mouth daily.     Nutritional Supplements (,FEEDING SUPPLEMENT, PROSOURCE PLUS) liquid Take 30 mLs by mouth 2 (two) times daily between meals. (Patient taking differently: Take 30 mLs by mouth daily as needed (  if needed between meals).)     oxyCODONE (ROXICODONE) 5 MG immediate release tablet Take 1 tablet (5 mg total) by mouth every 6 (six) hours as needed for up to 10 doses for breakthrough pain. 10 tablet 0   oxyCODONE-acetaminophen (PERCOCET/ROXICET) 5-325 MG tablet Take 1 tablet by mouth every 6 (six) hours as needed for moderate pain or severe pain. 20 tablet 0   pantoprazole (PROTONIX) 40 MG tablet Take 40 mg by mouth daily.     polyethylene glycol powder (GLYCOLAX/MIRALAX) 17 GM/SCOOP powder Take 17 g by mouth daily as needed for mild constipation.     pregabalin (LYRICA) 25 MG capsule Take 1 capsule (25 mg total) by mouth 2 (two) times daily. (Patient taking differently: Take 75 mg by mouth  daily.)     pregabalin (LYRICA) 75 MG capsule Take 75 mg by mouth daily.     senna-docusate (SENOKOT-S) 8.6-50 MG tablet Take 2 tablets by mouth 2 (two) times daily as needed for mild constipation.     sertraline (ZOLOFT) 100 MG tablet Take 100 mg by mouth daily.     tamsulosin (FLOMAX) 0.4 MG CAPS capsule Take 1 capsule (0.4 mg total) by mouth daily. 30 capsule 0   torsemide (DEMADEX) 100 MG tablet Take 1 tablet (100 mg total) by mouth every Tuesday, Thursday, Saturday, and Sunday. (Patient taking differently: Take 100 mg by mouth 2 (two) times daily.)     traZODone (DESYREL) 50 MG tablet Take 200 mg by mouth at bedtime.     insulin regular human CONCENTRATED (HUMULIN R U-500 KWIKPEN) 500 UNIT/ML KwikPen Inject 45 Units into the skin daily with supper. (Patient taking differently: Inject 125 Units into the skin 2 (two) times daily with a meal.) 2.7 mL 0   nortriptyline (PAMELOR) 10 MG capsule Take 20 mg by mouth at bedtime.     No current facility-administered medications for this visit.    On ROS today: negative unless stated in HPI   Physical Examination   Vitals:   08/25/22 1019  BP: (!) 159/69  Pulse: 91  Resp: 20  Temp: 98 F (36.7 C)  TempSrc: Temporal  SpO2: 90%  Height: 5\' 5" (1.651 m)   Body mass index is 47.59 kg/m.  General Not in any acute distress  Pulmonary Non labored  Cardiac Regular rate and rhythm   Vascular Vessel Right Left  Radial Palpable Palpable  Brachial Palpable Palpable  Ulnar Not palpable Not palpable    Musculo- skeletal Right upper arm with some swelling. Hard to truly appreciate with patients body habitus.Palpable thrill in graft. Good bruit M/S 5/5 throughout  , Extremities without ischemic changes    Neurologic A&O; CN grossly intact     Non-invasive Vascular Imaging   right Arm Access Duplex  (08/25/22):     +--------------------+----------+-----------------+--------+  AVG                 PSV (cm/s)Flow Vol (mL/min)Describe   +--------------------+----------+-----------------+--------+  Native artery inflow   224           403                 +--------------------+----------+-----------------+--------+  Arterial anastomosis   495                               +--------------------+----------+-----------------+--------+  Prox graft             31 2                               +--------------------+----------+-----------------+--------+  Mid graft              159                               +--------------------+----------+-----------------+--------+  Distal graft           248                               +--------------------+----------+-----------------+--------+  Venous anastomosis     464                               +--------------------+----------+-----------------+--------+  Venous outflow         128                               +--------------------+----------+-----------------+--------+   Summary:  Patent arteriovenous graft.     Medical Decision Making   Jeffrey [name] is a 61 y.o. male who presents for evaluation of recurrent right upper arm swelling and pain after dialysis. 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 Donzetta Matters. Patient right upper arm AV graft on duplex. 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 Patient is agreeable to proceed. Will arrange fistulogram in the near future with Dr. Donzetta Matters on a non dialysis day.    Karoline Caldwell, PA-C [facility] [place] Office: [phone]  Clinic MD: Yetta Barre

## 2022-09-05 ENCOUNTER — Ambulatory Visit (HOSPITAL_COMMUNITY)
Admission: RE | Admit: 2022-09-05 | Discharge: 2022-09-05 | Disposition: A | Discharge status: Home / Self Care | Payer: Medicare Other | Attending: Vascular Surgery | Admitting: Vascular Surgery

## 2022-09-05 ENCOUNTER — Ambulatory Visit (HOSPITAL_COMMUNITY)
Admission: RE | Disposition: A | Discharge status: Home / Self Care | Payer: Self-pay | Source: Home / Self Care | Attending: Vascular Surgery

## 2022-09-05 ENCOUNTER — Other Ambulatory Visit: Payer: Self-pay

## 2022-09-05 DIAGNOSIS — Z95828 Presence of other vascular implants and grafts: Secondary | ICD-10-CM | POA: Diagnosis not present

## 2022-09-05 DIAGNOSIS — N186 End stage renal disease: Secondary | ICD-10-CM | POA: Insufficient documentation

## 2022-09-05 DIAGNOSIS — T82898A Other specified complication of vascular prosthetic devices, implants and grafts, initial encounter: Secondary | ICD-10-CM | POA: Diagnosis not present

## 2022-09-05 DIAGNOSIS — Z992 Dependence on renal dialysis: Secondary | ICD-10-CM | POA: Diagnosis not present

## 2022-09-05 DIAGNOSIS — Y832 Surgical operation with anastomosis, bypass or graft as the cause of abnormal reaction of the patient, or of later complication, without mention of misadventure at the time of the procedure: Secondary | ICD-10-CM | POA: Diagnosis not present

## 2022-09-05 DIAGNOSIS — T82858A Stenosis of vascular prosthetic devices, implants and grafts, initial encounter: Secondary | ICD-10-CM | POA: Insufficient documentation

## 2022-09-05 DIAGNOSIS — N185 Chronic kidney disease, stage 5: Secondary | ICD-10-CM | POA: Diagnosis not present

## 2022-09-05 HISTORY — PX: A/V FISTULAGRAM: CATH118298

## 2022-09-05 LAB — POCT I-STAT, CHEM 8
BUN: >130 mg/dL — ABNORMAL HIGH (ref 8–23)
Calcium, Ion: 0.93 mmol/L — ABNORMAL LOW (ref 1.15–1.40)
Chloride: 86 mmol/L — ABNORMAL LOW (ref 98–111)
Creatinine, Ser: 4.9 mg/dL — ABNORMAL HIGH (ref 0.61–1.24)
Glucose, Bld: 378 mg/dL — ABNORMAL HIGH (ref 70–99)
HCT: 37 % — ABNORMAL LOW (ref 39.0–52.0)
Hemoglobin: 12.6 g/dL — ABNORMAL LOW (ref 13.0–17.0)
Potassium: 3.4 mmol/L — ABNORMAL LOW (ref 3.5–5.1)
Sodium: 132 mmol/L — ABNORMAL LOW (ref 135–145)
TCO2: 35 mmol/L — ABNORMAL HIGH (ref 22–32)

## 2022-09-05 LAB — GLUCOSE, CAPILLARY: Glucose-Capillary: 269 mg/dL — ABNORMAL HIGH (ref 70–99)

## 2022-09-05 SURGERY — A/V FISTULAGRAM
Anesthesia: LOCAL | Laterality: Right

## 2022-09-05 MED ORDER — OXYCODONE-ACETAMINOPHEN 5-325 MG PO TABS
1.0000 | ORAL_TABLET | Freq: Once | ORAL | Status: AC
  Administered 2022-09-05: 1 via ORAL
  Filled 2022-09-05: qty 1

## 2022-09-05 MED ORDER — FENTANYL CITRATE (PF) 100 MCG/2ML IJ SOLN
INTRAMUSCULAR | Status: DC | PRN
  Administered 2022-09-05: 50 ug via INTRAVENOUS
  Administered 2022-09-05: 25 ug via INTRAVENOUS

## 2022-09-05 MED ORDER — SODIUM CHLORIDE 0.9% FLUSH: 3.0000 mL | Freq: Two times a day (BID) | INTRAVENOUS | Status: DC

## 2022-09-05 MED ORDER — MIDAZOLAM HCL 2 MG/2ML IJ SOLN
INTRAMUSCULAR | Status: AC
  Filled 2022-09-05: qty 2

## 2022-09-05 MED ORDER — HEPARIN (PORCINE) IN NACL 1000-0.9 UT/500ML-% IV SOLN
INTRAVENOUS | Status: AC
  Filled 2022-09-05: qty 500

## 2022-09-05 MED ORDER — IODIXANOL 320 MG/ML IV SOLN
INTRAVENOUS | Status: DC | PRN
  Administered 2022-09-05: 65 mL

## 2022-09-05 MED ORDER — LIDOCAINE HCL (PF) 1 % IJ SOLN
INTRAMUSCULAR | Status: AC
  Filled 2022-09-05: qty 30

## 2022-09-05 MED ORDER — SODIUM CHLORIDE 0.9% FLUSH: 3.0000 mL | INTRAVENOUS | Status: DC | PRN

## 2022-09-05 MED ORDER — LIDOCAINE HCL (PF) 1 % IJ SOLN
INTRAMUSCULAR | Status: DC | PRN
  Administered 2022-09-05: 2 mL via SUBCUTANEOUS

## 2022-09-05 MED ORDER — FENTANYL CITRATE (PF) 100 MCG/2ML IJ SOLN
INTRAMUSCULAR | Status: AC
  Filled 2022-09-05: qty 2

## 2022-09-05 MED ORDER — MIDAZOLAM HCL 2 MG/2ML IJ SOLN
INTRAMUSCULAR | Status: DC | PRN
  Administered 2022-09-05 (×2): 1 mg via INTRAVENOUS

## 2022-09-05 MED ORDER — SODIUM CHLORIDE 0.9 % IV SOLN: 250.0000 mL | INTRAVENOUS | Status: DC | PRN

## 2022-09-05 SURGICAL SUPPLY — 15 items
BAG SNAP BAND KOVER 36X36 (MISCELLANEOUS) ×1 IMPLANT
CATH ANGIO 5F BER2 65CM (CATHETERS) IMPLANT
COVER DOME SNAP 22 D (MISCELLANEOUS) ×1 IMPLANT
KIT ENCORE 26 ADVANTAGE (KITS) IMPLANT
KIT MICROPUNCTURE NIT STIFF (SHEATH) IMPLANT
PROTECTION STATION PRESSURIZED (MISCELLANEOUS) ×1
SHEATH PINNACLE R/O II 7F 4CM (SHEATH) IMPLANT
SHEATH PROBE COVER 6X72 (BAG) ×1 IMPLANT
STATION PROTECTION PRESSURIZED (MISCELLANEOUS) ×1 IMPLANT
STENT EXPRESS LD 10X25X135 (Permanent Stent) IMPLANT
STOPCOCK MORSE 400PSI 3WAY (MISCELLANEOUS) ×1 IMPLANT
TRAY PV CATH (CUSTOM PROCEDURE TRAY) ×1 IMPLANT
TUBING CIL FLEX 10 FLL-RA (TUBING) ×1 IMPLANT
WIRE BENTSON .035X145CM (WIRE) IMPLANT
WIRE ROSEN-J .035X260CM (WIRE) IMPLANT

## 2022-09-05 NOTE — Op Note (Signed)
    Patient name: Jeffrey [name] MRN: [id number] DOB: 1960-12-24 Sex: male  09/05/2022 Pre-operative Diagnosis: End-stage renal disease, right upper remedy edema with pulsatility and right arm AV graft Post-operative diagnosis:  Same Surgeon:  Eda Paschal. Donzetta Matters, MD Procedure Performed: 1.  Ultrasound-guided cannulation right arm AV graft 2.  Right upper extremity shuntogram 3.  Stent of right innominate vein with 10 x 25 mm LD express 4.  Moderate sedation with fentanyl and Versed for 60 minutes   Indications: 61 year old male with a history of end-stage renal disease has a right arm AV graft now with new swelling of the right upper extremity similar to previous.  He has a previous right innominate vein stent with subsequent drug-coated balloon angioplasty of the stent is now indicated for fistulogram with possible intervention.  Findings: There was a 90% stenosis proximal to the previous stent.  After new stenting in the innominate vein this was reduced to 0% and there was a much improved thrill in the graft distally.   Procedure:  The patient was identified in the holding area and taken to room 8.  The patient was then placed supine on the table and prepped and draped in the usual sterile fashion.  A time out was called.  Ultrasound was used to evaluate the right arm AV graft.  The area was anesthetized with 1% lidocaine cannulated with a micropuncture needle followed by wire and sheath.  An ultrasound image was saved to the permanent record.  Right upper extremity shuntogram was performed.  Concomitantly moderate sedation with fentanyl and Versed was administered and his vital signs were monitored throughout the case.  With the above findings we exchanged for a Bentson wire and a 7 [place] sheath.  We performed dedicated shuntogram at the level of the stent.  We then crossed the stenosis in the stent placed a Rosen wire distally and primarily stented with a 10 x 25 mm LD express.  Completion  demonstrated no residual stenosis.  Satisfied with this remove the catheters and wires.  Sheath cannulation site was suture-ligated with 4-0 Monocryl and the sheath was removed.  He tolerated procedure without immediate complication.  Contrast: 65 cc  Edmar Blankenburg C. Donzetta Matters, MD [facility] [place] Office: [phone] Pager: [phone]

## 2022-09-05 NOTE — Interval H&P Note (Signed)
History and Physical Interval Note:  09/05/2022 11:16 AM  Jeffrey [name]  has presented today for surgery, with the diagnosis of end stage renal disease.  The various methods of treatment have been discussed with the patient and family. After consideration of risks, benefits and other options for treatment, the patient has consented to  Procedure(s): A/V Fistulagram (Right) as a surgical intervention.  The patient's history has been reviewed, patient examined, no change in status, stable for surgery.  I have reviewed the patient's chart and labs.  Questions were answered to the patient's satisfaction.     Servando Snare

## 2022-09-06 ENCOUNTER — Encounter (HOSPITAL_COMMUNITY): Payer: Self-pay | Admitting: Vascular Surgery

## 2022-09-06 MED FILL — Heparin Sod (Porcine)-NaCl IV Soln 1000 Unit/500ML-0.9%: INTRAVENOUS | Qty: 1000 | Status: AC

## 2022-10-12 ENCOUNTER — Telehealth: Payer: Self-pay

## 2022-10-12 NOTE — Telephone Encounter (Signed)
Pt called with c/o "access swelling up again". This began after is treatment Monday. He is getting ready to go back to HD shortly. I have advised him to see how it is doing at HD and to have the nurse call us and let us know if they feel he needs to be seen. Pt verbalized understanding of this and is in agreement with this plan.

## 2022-10-19 ENCOUNTER — Encounter (HOSPITAL_COMMUNITY): Payer: Self-pay

## 2022-10-19 ENCOUNTER — Other Ambulatory Visit: Payer: Self-pay

## 2022-10-19 ENCOUNTER — Emergency Department (HOSPITAL_COMMUNITY): Payer: Medicare Other

## 2022-10-19 ENCOUNTER — Emergency Department (HOSPITAL_COMMUNITY)
Admission: EM | Admit: 2022-10-19 | Discharge: 2022-10-20 | Disposition: A | Discharge status: Home / Self Care | Payer: Medicare Other | Attending: Emergency Medicine | Admitting: Emergency Medicine

## 2022-10-19 DIAGNOSIS — R079 Chest pain, unspecified: Secondary | ICD-10-CM | POA: Insufficient documentation

## 2022-10-19 DIAGNOSIS — Z794 Long term (current) use of insulin: Secondary | ICD-10-CM | POA: Insufficient documentation

## 2022-10-19 DIAGNOSIS — N186 End stage renal disease: Secondary | ICD-10-CM | POA: Insufficient documentation

## 2022-10-19 DIAGNOSIS — Z7982 Long term (current) use of aspirin: Secondary | ICD-10-CM | POA: Diagnosis not present

## 2022-10-19 DIAGNOSIS — R531 Weakness: Secondary | ICD-10-CM | POA: Diagnosis present

## 2022-10-19 DIAGNOSIS — Z992 Dependence on renal dialysis: Secondary | ICD-10-CM | POA: Diagnosis not present

## 2022-10-19 DIAGNOSIS — R0602 Shortness of breath: Secondary | ICD-10-CM | POA: Diagnosis not present

## 2022-10-19 DIAGNOSIS — M47816 Spondylosis without myelopathy or radiculopathy, lumbar region: Secondary | ICD-10-CM | POA: Insufficient documentation

## 2022-10-19 LAB — CBC
HCT: 31.5 % — ABNORMAL LOW (ref 39.0–52.0)
Hemoglobin: 10.2 g/dL — ABNORMAL LOW (ref 13.0–17.0)
MCH: 29.1 pg (ref 26.0–34.0)
MCHC: 32.4 g/dL (ref 30.0–36.0)
MCV: 89.7 fL (ref 80.0–100.0)
Platelets: 108 10*3/uL — ABNORMAL LOW (ref 150–400)
RBC: 3.51 MIL/uL — ABNORMAL LOW (ref 4.22–5.81)
RDW: 15 % (ref 11.5–15.5)
WBC: 10.2 10*3/uL (ref 4.0–10.5)
nRBC: 0 % (ref 0.0–0.2)

## 2022-10-19 LAB — DIFFERENTIAL
Abs Immature Granulocytes: 0.26 10*3/uL — ABNORMAL HIGH (ref 0.00–0.07)
Basophils Absolute: 0 10*3/uL (ref 0.0–0.1)
Basophils Relative: 0 %
Eosinophils Absolute: 0.1 10*3/uL (ref 0.0–0.5)
Eosinophils Relative: 1 %
Immature Granulocytes: 3 %
Lymphocytes Relative: 9 %
Lymphs Abs: 0.9 10*3/uL (ref 0.7–4.0)
Monocytes Absolute: 0.7 10*3/uL (ref 0.1–1.0)
Monocytes Relative: 7 %
Neutro Abs: 8.2 10*3/uL — ABNORMAL HIGH (ref 1.7–7.7)
Neutrophils Relative %: 80 %

## 2022-10-19 LAB — I-STAT CHEM 8, ED
BUN: 62 mg/dL — ABNORMAL HIGH (ref 8–23)
Calcium, Ion: 0.89 mmol/L — CL (ref 1.15–1.40)
Chloride: 95 mmol/L — ABNORMAL LOW (ref 98–111)
Creatinine, Ser: 6.2 mg/dL — ABNORMAL HIGH (ref 0.61–1.24)
Glucose, Bld: 337 mg/dL — ABNORMAL HIGH (ref 70–99)
HCT: 32 % — ABNORMAL LOW (ref 39.0–52.0)
Hemoglobin: 10.9 g/dL — ABNORMAL LOW (ref 13.0–17.0)
Potassium: 3.3 mmol/L — ABNORMAL LOW (ref 3.5–5.1)
Sodium: 129 mmol/L — ABNORMAL LOW (ref 135–145)
TCO2: 26 mmol/L (ref 22–32)

## 2022-10-19 LAB — COMPREHENSIVE METABOLIC PANEL
ALT: 11 U/L (ref 0–44)
AST: 13 U/L — ABNORMAL LOW (ref 15–41)
Albumin: 3.4 g/dL — ABNORMAL LOW (ref 3.5–5.0)
Alkaline Phosphatase: 75 U/L (ref 38–126)
Anion gap: 16 — ABNORMAL HIGH (ref 5–15)
BUN: 63 mg/dL — ABNORMAL HIGH (ref 8–23)
CO2: 25 mmol/L (ref 22–32)
Calcium: 8.8 mg/dL — ABNORMAL LOW (ref 8.9–10.3)
Chloride: 91 mmol/L — ABNORMAL LOW (ref 98–111)
Creatinine, Ser: 5.31 mg/dL — ABNORMAL HIGH (ref 0.61–1.24)
GFR, Estimated: 11 mL/min — ABNORMAL LOW (ref >60)
Glucose, Bld: 324 mg/dL — ABNORMAL HIGH (ref 70–99)
Potassium: 3.3 mmol/L — ABNORMAL LOW (ref 3.5–5.1)
Sodium: 132 mmol/L — ABNORMAL LOW (ref 135–145)
Total Bilirubin: 0.4 mg/dL (ref 0.3–1.2)
Total Protein: 7.4 g/dL (ref 6.5–8.1)

## 2022-10-19 LAB — PROTIME-INR
INR: 1.1 (ref 0.8–1.2)
Prothrombin Time: 14.2 seconds (ref 11.4–15.2)

## 2022-10-19 LAB — TROPONIN I (HIGH SENSITIVITY): Troponin I (High Sensitivity): 17 ng/L (ref <18)

## 2022-10-19 LAB — ETHANOL: Alcohol, Ethyl (B): <10 mg/dL (ref <10)

## 2022-10-19 LAB — APTT: aPTT: 31 seconds (ref 24–36)

## 2022-10-19 MED ORDER — FENTANYL CITRATE PF 50 MCG/ML IJ SOSY
100.0000 ug | PREFILLED_SYRINGE | Freq: Once | INTRAMUSCULAR | Status: AC
  Administered 2022-10-20: 100 ug via INTRAVENOUS
  Filled 2022-10-19: qty 2

## 2022-10-19 NOTE — ED Triage Notes (Addendum)
PER EMS: pt is from home with c/o sudden onset of right arm weakness at 2130 tonight when he was eating associated with pain with movement that started 12 hours ago. Graft/fistula to right arm. Left AKA, dialysis Mon/Wed/Fri. Last dialyzed today. He also reports chest pain and SOB.   BP- 140/70,HR-90,  88% RA on ems arrival and was placed on 4L [place]. He does not wear home O2. Sats now 98%, [id number]

## 2022-10-19 NOTE — ED Provider Triage Note (Signed)
Emergency Medicine Provider Triage Evaluation Note  Jeffrey [name] , a 61 y.o. male  was evaluated in triage.  Pt complains of right upper extremity pain, weakness sudden onset 9:30 PM while he was eating at home.  He reports that the pain, weakness radiates down his entire right side.  Patient reports that some of the weakness is secondary to pain but also feels out of proportion, he reports that he has significant pain, cramping at dialysis weekly that this is more severe.  He also endorses some chest pain, tightness, shortness of breath.  Denies any vision changes, numbness, tingling, loss of control of bladder, bowels..  Review of Systems  Positive: Arm pain, chest pain, shortness of breath Negative: Fever, chills, numbness  Physical Exam  BP 138/70 (BP Location: Right Arm)   Pulse 88   Temp 98.2 F (36.8 C) (Oral)   Resp 20   Ht 5\' 5"  (1.651 m)   Wt 132 kg   SpO2 100%   BMI 48.42 kg/m  Gen:   Awake, no distress   Resp:  Normal effort  MSK:   Moves extremities without difficulty  Other:  Left AKA, decreased strength 2/2 effort rue and rle, normal sensation CN 2-12 grossly intact  Medical Decision Making  Medically screening exam initiated at 10:46 PM.  Appropriate orders placed.  Jeffrey [name] was informed that the remainder of the evaluation will be completed by another provider, this initial triage assessment does not replace that evaluation, and the importance of remaining in the ED until their evaluation is complete.  Patient assessed in triage to determine whether a code stroke was required in this patient by Dr. Matilde Sprang, we made the decision not to call a code stroke based on our evaluation, lack of clear neurodeficit that cannot be explained by pain and decreased effort secondary to pain on initial assessment   Anselmo Pickler, PA-C 10/19/22 2248

## 2022-10-19 NOTE — ED Provider Notes (Signed)
[facility] EMERGENCY DEPARTMENT Provider Note   CSN: [id number] Arrival date & time: 10/19/22  2226     History {Add pertinent medical, surgical, social history, OB history to HPI:1} Chief Complaint  Patient presents with   Weakness    Jeffrey [name] is a 61 y.o. male.  61 year old male with a history of end-stage renal disease on dialysis that presents to the ER today secondary to acute onset of pain and weakness on his right side.  Patient states that he was eating around 2130 and his right arm or right leg became suddenly very painful and difficult to move.  Patient states has been having trouble with the graft in his right upper extremity is becoming more swollen over the last couple weeks he is having trouble getting seen for it.  Patient was seen by previous provider who spoke to neurology and it was decided not call a code stroke at this time.   Weakness      Home Medications Prior to Admission medications   Medication Sig Start Date End Date Taking? Authorizing Provider  ACCU-CHEK GUIDE test strip 4 (four) times daily. 11/15/20   [provider]  acetaminophen (TYLENOL) 325 MG tablet Take 2 tablets (650 mg total) by mouth every 6 (six) hours as needed for moderate pain. 04/14/21   Raiford Noble Latif, DO  albuterol (PROVENTIL) (2.5 MG/3ML) 0.083% nebulizer solution Inhale 2.5 mg into the lungs every 8 (eight) hours as needed for wheezing or shortness of breath.    [provider]  albuterol (VENTOLIN HFA) 108 (90 Base) MCG/ACT inhaler Inhale 1-2 puffs into the lungs every 6 (six) hours as needed for wheezing or shortness of breath.    [provider]  amLODipine (NORVASC) 10 MG tablet Take 10 mg by mouth every morning. 04/17/20   [provider]  ASPIRIN LOW DOSE 81 MG EC tablet Take 81 mg by mouth in the morning. 02/22/21   [provider]  atorvastatin (LIPITOR) 40 MG tablet Take 40 mg by mouth in the morning.  02/09/21   [provider]  calcium acetate (PHOSLO) 667 MG capsule Take 2 capsules (1,334 mg total) by mouth with breakfast, with lunch, and with evening meal. 04/10/21   Debbe Odea, MD  carvedilol (COREG) 12.5 MG tablet Take 12.5 mg by mouth 2 (two) times daily with a meal. 04/17/20   [provider]  Cholecalciferol 50 MCG (2000 UT) CAPS Take 2,000 Units by mouth in the morning. 08/24/20   [provider]  Continuous Blood Gluc Receiver (DEXCOM G6 RECEIVER) DEVI Use 1 Device continuously 03/08/21   [provider]  cyclobenzaprine (FLEXERIL) 10 MG tablet Take 1 tablet (10 mg total) by mouth 2 (two) times daily as needed for muscle spasms. 12/25/21   Curatolo, Adam, DO  diazepam (VALIUM) 5 MG tablet Take 5 mg by mouth in the morning and at bedtime. 12/09/21   [provider]  diphenhydrAMINE (BENADRYL) 25 MG tablet Take 50 mg by mouth every 6 (six) hours as needed for itching.    [provider]  HEPARIN SODIUM, PORCINE, IV Heparin Sodium (Porcine) 1,000 Units/mL Systemic 08/19/22 08/18/23  [provider]  insulin regular human CONCENTRATED (HUMULIN R U-500 KWIKPEN) 500 UNIT/ML KwikPen Inject 45 Units into the skin daily with supper. Patient taking differently: Inject 150 Units into the skin 2 (two) times daily with a meal. 09/04/21 08/30/24  Bonnell Public, MD  Methoxy PEG-Epoetin Beta (MIRCERA IJ) Mircera 08/22/22 08/21/23  [provider]  metolazone (ZAROXOLYN) 10 MG tablet Take 10 mg by mouth daily. 07/13/21   [provider]  nortriptyline (PAMELOR) 10 MG capsule Take 20 mg by mouth at bedtime. 10/23/19 08/30/24  [provider]  pantoprazole (PROTONIX) 40 MG tablet Take 40 mg by mouth daily before breakfast. 02/09/21   [provider]  polyethylene glycol powder (GLYCOLAX/MIRALAX) 17 GM/SCOOP powder Take 17 g by mouth daily as needed for mild constipation. 08/24/20   [provider]   pregabalin (LYRICA) 75 MG capsule Take 75 mg by mouth in the morning. 08/22/22   [provider]  senna-docusate (SENOKOT-S) 8.6-50 MG tablet Take 2 tablets by mouth 2 (two) times daily as needed for mild constipation. 10/23/19   [provider]  sertraline (ZOLOFT) 100 MG tablet Take 100 mg by mouth in the morning. 02/22/21   [provider]  tamsulosin (FLOMAX) 0.4 MG CAPS capsule Take 1 capsule (0.4 mg total) by mouth daily. 11/10/21   Darliss Cheney, MD  torsemide (DEMADEX) 20 MG tablet Take 100 mg by mouth 2 (two) times daily.    [provider]  traZODone (DESYREL) 150 MG tablet Take 150 mg by mouth at bedtime. 12/09/21   [provider]      Allergies    Actos [pioglitazone], Dexmedetomidine, Ibuprofen, Tomato, and Wellbutrin [bupropion]    Review of Systems   Review of Systems  Neurological:  Positive for weakness.    Physical Exam Updated Vital Signs BP 138/70 (BP Location: Right Arm)   Pulse 88   Temp 98.2 F (36.8 C) (Oral)   Resp 20   Ht 5\' 5"  (1.651 m)   Wt 132 kg   SpO2 100%   BMI 48.42 kg/m  Physical Exam Vitals and nursing note reviewed.  Constitutional:      Appearance: He is well-developed.  HENT:     Head: Normocephalic and atraumatic.     Mouth/Throat:     Mouth: Mucous membranes are moist.     Pharynx: Oropharynx is clear.  Eyes:     Pupils: Pupils are equal, round, and reactive to light.  Cardiovascular:     Rate and Rhythm: Normal rate.  Pulmonary:     Effort: Pulmonary effort is normal. No respiratory distress.  Abdominal:     General: Abdomen is flat. There is no distension.  Musculoskeletal:        General: Normal range of motion.     Cervical back: Normal range of motion.  Skin:    General: Skin is warm and dry.  Neurological:     General: No focal deficit present.     Mental Status: He is alert.     ED Results / Procedures / Treatments   Labs (all labs ordered are listed, but only abnormal  results are displayed) Labs Reviewed  CBC - Abnormal; Notable for the following components:      Result Value   RBC 3.51 (*)    Hemoglobin 10.2 (*)    HCT 31.5 (*)    Platelets 108 (*)    All other components within normal limits  DIFFERENTIAL - Abnormal; Notable for the following components:   Neutro Abs 8.2 (*)    Abs Immature Granulocytes 0.26 (*)    All other components within normal limits  COMPREHENSIVE METABOLIC PANEL - Abnormal; Notable for the following components:   Sodium 132 (*)    Potassium 3.3 (*)    Chloride 91 (*)    Glucose, Bld 324 (*)  BUN 63 (*)    Creatinine, Ser 5.31 (*)    Calcium 8.8 (*)    Albumin 3.4 (*)    AST 13 (*)    GFR, Estimated 11 (*)    Anion gap 16 (*)    All other components within normal limits  I-STAT CHEM 8, ED - Abnormal; Notable for the following components:   Sodium 129 (*)    Potassium 3.3 (*)    Chloride 95 (*)    BUN 62 (*)    Creatinine, Ser 6.20 (*)    Glucose, Bld 337 (*)    Calcium, Ion 0.89 (*)    Hemoglobin 10.9 (*)    HCT 32.0 (*)    All other components within normal limits  ETHANOL  PROTIME-INR  APTT  RAPID URINE DRUG SCREEN, HOSP PERFORMED  URINALYSIS, ROUTINE W REFLEX MICROSCOPIC  TROPONIN I (HIGH SENSITIVITY)    EKG None  Radiology DG Chest 2 View  Result Date: 10/19/2022 CLINICAL DATA:  Shortness of breath, chest pain EXAM: CHEST - 2 VIEW COMPARISON:  08/15/2022 FINDINGS: Heart and mediastinal contours are within normal limits. No focal opacities or effusions. No acute bony abnormality. IMPRESSION: No active cardiopulmonary disease. Electronically Signed   By: Rolm Baptise M.D.   On: 10/19/2022 23:15    Procedures Procedures  {Document cardiac monitor, telemetry assessment procedure when appropriate:1}  Medications Ordered in ED Medications  fentaNYL (SUBLIMAZE) injection 100 mcg (has no administration in time range)    ED Course/ Medical Decision Making/ A&P                            Medical Decision Making Neurology had already been talked about this patient code stroke is not can be called this time.  Also consider possible arterial dissection does not have any chest, back or abdominal pain is all in his arm.  His graft on that side has a bruit but no palpable thrills it is slightly swollen.  No erythema or induration to suggest infection.  We will plan for basic work-up.  Cannot get the appropriate ultrasounds at this time of night.  May need to speak with vascular. ***  {Document critical care time when appropriate:1} {Document review of labs and clinical decision tools ie heart score, Chads2Vasc2 etc:1}  {Document your independent review of radiology images, and any outside records:1} {Document your discussion with family members, caretakers, and with consultants:1} {Document social determinants of health affecting pt's care:1} {Document your decision making why or why not admission, treatments were needed:1} Final Clinical Impression(s) / ED Diagnoses Final diagnoses:  None    Rx / DC Orders ED Discharge Orders     None

## 2022-10-19 NOTE — ED Notes (Signed)
Dr. Tinnie Gens at bedside to assess patient.

## 2022-10-19 NOTE — ED Notes (Signed)
Pt to xray

## 2022-10-20 ENCOUNTER — Emergency Department (HOSPITAL_COMMUNITY): Payer: Medicare Other

## 2022-10-20 LAB — TROPONIN I (HIGH SENSITIVITY): Troponin I (High Sensitivity): 17 ng/L (ref <18)

## 2022-10-20 MED ORDER — IOHEXOL 350 MG/ML SOLN
100.0000 mL | Freq: Once | INTRAVENOUS | Status: AC | PRN
  Administered 2022-10-20: 100 mL via INTRAVENOUS

## 2022-10-20 NOTE — ED Notes (Signed)
Patients daughter contacted. Patient being discharged. Patient will go home with daughter.

## 2022-10-24 ENCOUNTER — Telehealth: Payer: Self-pay

## 2022-10-24 DIAGNOSIS — N186 End stage renal disease: Secondary | ICD-10-CM

## 2022-10-24 NOTE — Progress Notes (Unsigned)
HISTORY AND PHYSICAL     CC:  dialysis access Requesting Provider:  Bobette Mo*  HPI: This is a 61 y.o. male here for evaluation of his hemodialysis access.  Pt has hx of ESRD with   left BC AVF created at [facility] and was ligated due to steal syndrome.  He subsequently had a RUA axillary loop AVG placed earlier in 2022.    On 10/19/2022, he went to the ER for acute onset of pain and weakness of the right side.  Provider discussed with neurology and code stroke was not called.  He had CT to evaluate for dissection and this was negative.  He was discharged home with plans to f/u with vascular surgery.    He comes in today with c/o swelling in the right arm.  He states every time they use his graft, he has pain and swelling.  He states it is similar to his last episode when he had fistulogram with Dr. Donzetta Matters.  He states he is having trouble using his arm due to the swelling.  He states he has some pigmentation changes on his lower arm.  He would not let them stick his graft yesterday until this is evaluated.    About a week ago, he was taken to the ER with right arm weakness.  A code stroke was not called.  It was felt to be due to his graft.   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    Pt is [employer] and from [place]. He came to [place] to take care of his grandmother and stayed.   Dialysis access history: -left BC AVF and ligation for steal syndrome - [facility] -RUE axillary loop AVG 2022 -fistulogram and angioplasty - [facility] -venogram 08/10/2021 Dr. Trula Slade -right brachial artery to axillary vein HD graft 08/12/2021 Dr. Trula Slade -fistulogram with drug coated balloon angioplasty right innominate vein 08/30/2021 Dr. Donzetta Matters -fistulogram with stent of right innominate vein 11/08/2021 Dr. Donzetta Matters -fistulogram with drug coated balloon angioplasty of right innominate vein in stent restenosis and right SCV 07/28/2022 Dr. Donzetta Matters -fistulogram with stent of right innominate  vein 09/05/2022 Dr. Donzetta Matters   Pt is on dialysis.   Days of dialysis if applicable:  M/W/F    HD center:  [place] location.   The pt is on a statin for cholesterol management.  The pt is on a daily aspirin.  Other AC:  none The pt is on CCB, BB for hypertension.  The pt does have diabetic.   Tobacco hx:  former  Past Medical History:  Diagnosis Date   Arthritis    Blood transfusion without reported diagnosis    CHF (congestive heart failure) (HCC)    COPD (chronic obstructive pulmonary disease) ([place])    Diabetes mellitus without complication ([place])    Hypertension    PTSD (post-traumatic stress disorder)    Renal disorder    Stage 5 Kidney Failure    Past Surgical History:  Procedure Laterality Date   A/V FISTULAGRAM N/A 08/30/2021   Procedure: A/V FISTULAGRAM;  Surgeon: Waynetta Sandy, MD;  Location: [facility];  Service: Cardiovascular;  Laterality: N/A;   A/V FISTULAGRAM Right 07/28/2022   Procedure: A/V Fistulagram;  Surgeon: Waynetta Sandy, MD;  Location: [facility];  Service: Cardiovascular;  Laterality: Right;   A/V FISTULAGRAM Right 09/05/2022   Procedure: A/V Fistulagram;  Surgeon: Waynetta Sandy, MD;  Location: [facility];  Service: Cardiovascular;  Laterality: Right;   A/V SHUNTOGRAM N/A 11/08/2021   Procedure: A/V SHUNTOGRAM;  Surgeon: Waynetta Sandy, MD;  Location: [facility];  Service: Cardiovascular;  Laterality: N/A;   AMPUTATION Left 04/03/2021   Procedure: LEFT ABOVE-THE-KNEE AMPUTATION;  Surgeon: Erle Crocker, MD;  Location: [facility];  Service: Orthopedics;  Laterality: Left;   AV FISTULA PLACEMENT Right 08/12/2021   Procedure: INSERTION OF ARTERIOVENOUS (AV) GORE-TEX GRAFT RIGHT ARM;  Surgeon: Serafina Mitchell, MD;  Location: [facility];  Service: Vascular;  Laterality: Right;   IR REMOVAL TUN CV CATH W/O FL  03/30/2021   JOINT REPLACEMENT     Bilateral knees   PERIPHERAL VASCULAR  BALLOON ANGIOPLASTY Right 08/30/2021   Procedure: PERIPHERAL VASCULAR BALLOON ANGIOPLASTY;  Surgeon: Waynetta Sandy, MD;  Location: [facility];  Service: Cardiovascular;  Laterality: Right;   PERIPHERAL VASCULAR BALLOON ANGIOPLASTY Right 07/28/2022   Procedure: PERIPHERAL VASCULAR BALLOON ANGIOPLASTY;  Surgeon: Waynetta Sandy, MD;  Location: [facility];  Service: Cardiovascular;  Laterality: Right;  AVF   PERIPHERAL VASCULAR INTERVENTION  11/08/2021   Procedure: PERIPHERAL VASCULAR INTERVENTION;  Surgeon: Waynetta Sandy, MD;  Location: [facility];  Service: Cardiovascular;;  Central rt arm fistula   UPPER EXTREMITY VENOGRAPHY Right 08/10/2021   Procedure: UPPER EXTREMITY VENOGRAPHY;  Surgeon: Serafina Mitchell, MD;  Location: [facility];  Service: Cardiovascular;  Laterality: Right;    Allergies  Allergen Reactions   Actos [Pioglitazone] Anaphylaxis and Rash   Dexmedetomidine Nausea And Vomiting    (Precedex) Dose-limiting bradycardia     Ibuprofen Shortness Of Breath and Swelling    Pt tolerates aspirin    Tomato Anaphylaxis    Only allergic to RAW tomatoes   Wellbutrin [Bupropion] Swelling    Current Outpatient Medications  Medication Sig Dispense Refill   ACCU-CHEK GUIDE test strip 4 (four) times daily.     acetaminophen (TYLENOL) 325 MG tablet Take 2 tablets (650 mg total) by mouth every 6 (six) hours as needed for moderate pain. 20 tablet 0   albuterol (PROVENTIL) (2.5 MG/3ML) 0.083% nebulizer solution Inhale 2.5 mg into the lungs every 8 (eight) hours as needed for wheezing or shortness of breath.     albuterol (VENTOLIN HFA) 108 (90 Base) MCG/ACT inhaler Inhale 1-2 puffs into the lungs every 6 (six) hours as needed for wheezing or shortness of breath.     amLODipine (NORVASC) 10 MG tablet Take 10 mg by mouth every morning.     ASPIRIN LOW DOSE 81 MG EC tablet Take 81 mg by mouth in the morning.     atorvastatin  (LIPITOR) 40 MG tablet Take 40 mg by mouth in the morning.     calcium acetate (PHOSLO) 667 MG capsule Take 2 capsules (1,334 mg total) by mouth with breakfast, with lunch, and with evening meal.     carvedilol (COREG) 12.5 MG tablet Take 12.5 mg by mouth 2 (two) times daily with a meal.     Cholecalciferol 50 MCG (2000 UT) CAPS Take 2,000 Units by mouth in the morning.     Continuous Blood Gluc Receiver (DEXCOM G6 RECEIVER) DEVI Use 1 Device continuously     cyclobenzaprine (FLEXERIL) 10 MG tablet Take 1 tablet (10 mg total) by mouth 2 (two) times daily as needed for muscle spasms. 20 tablet 0   diazepam (VALIUM) 5 MG tablet Take 5 mg by mouth in the morning and at bedtime.     diphenhydrAMINE (BENADRYL)  25 MG tablet Take 50 mg by mouth every 6 (six) hours as needed for itching.     HEPARIN SODIUM, PORCINE, IV Heparin Sodium (Porcine) 1,000 Units/mL Systemic     insulin regular human CONCENTRATED (HUMULIN R U-500 KWIKPEN) 500 UNIT/ML KwikPen Inject 45 Units into the skin daily with supper. (Patient taking differently: Inject 150 Units into the skin 2 (two) times daily with a meal.) 2.7 mL 0   Methoxy PEG-Epoetin Beta (MIRCERA IJ) Mircera     metolazone (ZAROXOLYN) 10 MG tablet Take 10 mg by mouth daily.     nortriptyline (PAMELOR) 10 MG capsule Take 20 mg by mouth at bedtime.     pantoprazole (PROTONIX) 40 MG tablet Take 40 mg by mouth daily before breakfast.     polyethylene glycol powder (GLYCOLAX/MIRALAX) 17 GM/SCOOP powder Take 17 g by mouth daily as needed for mild constipation.     pregabalin (LYRICA) 75 MG capsule Take 75 mg by mouth in the morning.     senna-docusate (SENOKOT-S) 8.6-50 MG tablet Take 2 tablets by mouth 2 (two) times daily as needed for mild constipation.     sertraline (ZOLOFT) 100 MG tablet Take 100 mg by mouth in the morning.     tamsulosin (FLOMAX) 0.4 MG CAPS capsule Take 1 capsule (0.4 mg total) by mouth daily. 30 capsule 0   torsemide (DEMADEX) 20 MG tablet Take 100  mg by mouth 2 (two) times daily.     traZODone (DESYREL) 150 MG tablet Take 150 mg by mouth at bedtime.     No current facility-administered medications for this visit.    No family history on file.  Social History   Socioeconomic History   Marital status: Married    Spouse name: Not on file   Number of children: Not on file   Years of education: Not on file   Highest education level: Not on file  Occupational History   Occupation: disabled  Tobacco Use   Smoking status: Former    Types: Cigarettes    Quit date: 2012    Years since quitting: 11.9   Smokeless tobacco: Never  Vaping Use   Vaping Use: Never used  Substance and Sexual Activity   Alcohol use: Yes    Comment: occasionally   Drug use: Not Currently    Comment: Years ago   Sexual activity: Not on file  Other Topics Concern   Not on file  Social History Narrative   Not on file   Social Determinants of Health   Financial Resource Strain: Not on file  Food Insecurity: Not on file  Transportation Needs: Not on file  Physical Activity: Not on file  Stress: Not on file  Social Connections: Not on file  Intimate Partner Violence: Not on file     ROS: [x]  Positive   [ ]  Negative   [ ]  All sytems reviewed and are negative  Cardiac: [x]  chest pain/pressure [x]  SOB/DOE  Vascular: [x]  arm swelling right arm   Pulmonary: []  asthma []  wheezing  Neurologic: []  hx CVA/TIA  Hematologic: []  bleeding problems  GI []  GERD  GU: [x]  CKD/renal failure  [x]  HD---[x]  M/W/F []  T/T/S  Psychiatric: []  hx of major depression  Integumentary: [x]  rashes pigmentation changes right arm []  ulcers  Constitutional: []  fever []  chills   PHYSICAL EXAMINATION:  Today's Vitals   10/27/22 1219  BP: 133/63  Pulse: 83  Resp: 20  Temp: 98.3 F (36.8 C)  SpO2: 90%  Weight: (!) 345 lb  7.4 oz (156.7 kg)  Height: 5\' 5"  (1.651 m)  PainSc: 7    Body mass index is 57.49 kg/m.    General:  WDWN male in  NAD Gait: Not observed HENT: WNL Pulmonary: normal non-labored breathing  Cardiac: regular, without carotid bruits Abdomen: soft, NT Skin: without rashes; pigmentation changes right forearm Vascular Exam/Pulses:  Right radial pulse is not palpable; left radial is 2+ Extremities:  RUE AVG is pulsatile; right arm swelling present Musculoskeletal: no muscle wasting or atrophy  Neurologic: A&O X 3  Non-Invasive Vascular Imaging:   Dialysis duplex on 10/27/2022: +--------------------+----------+-----------------+------------------+  AVG                PSV (cm/s)Flow Vol (mL/min)     Describe       +--------------------+----------+-----------------+------------------+  Native artery inflow   271          1344                           +--------------------+----------+-----------------+------------------+  Arterial anastomosis   360                                         +--------------------+----------+-----------------+------------------+  Prox graft             410                                         +--------------------+----------+-----------------+------------------+  Mid graft              226                     change in diameter  +--------------------+----------+-----------------+------------------+  Distal graft           296                                         +--------------------+----------+-----------------+------------------+  Venous anastomosis     220                                         +--------------------+----------+-----------------+------------------+  Venous outflow         166                                         +--------------------+----------+-----------------+------------------+     ASSESSMENT/PLAN: 61 y.o. male with ESRD here for evaluation of his hemodialysis access with hx of ESRD with   left BC AVF created at [facility] and was ligated due to steal syndrome.  He subsequently had a RUA axillary loop AVG  placed earlier in 2022 here for right arm swelling.     -pt with RUE AVG and right arm swelling that he states is similar to previous swelling.  Will plan for fistulogram on Monday 10/31/2022 with Dr. Donzetta Matters with possible intervention and possible TDC placement.  -pt is on dialysis on M/W/F at [place] location -discussed with pt that access does not last forever and will  need intervention or even new access at some point.     Leontine Locket, [facility] [phone]  Clinic MD:   Stanford Breed on call MD

## 2022-10-24 NOTE — Telephone Encounter (Signed)
Eudelia Bunch with [facility] called stating that the pt was c/o swelling and pain in his access arm.  Reviewed pt's chart, returned call for clarification, two identifiers used. Pt was not at HD center because he was refusing to be "stuck" while having all the swelling.  Called pt to clarify symptoms. Pt states that his arm is so badly swollen, he can barely use it. He was advised to keep it elevated as much as possible. He denies any cold hands/fingers, any discoloration, or other steal symptoms. Appt made for first available for [place] and MD appt tomorrow, 11/28. Pt to get transportation arranged. Confirmed understanding.

## 2022-10-24 NOTE — H&P (View-Only) (Signed)
HISTORY AND PHYSICAL     CC:  dialysis access Requesting Provider:  Bobette Mo*  HPI: This is a 61 y.o. male here for evaluation of his hemodialysis access.  Pt has hx of ESRD with   left BC AVF created at [facility] and was ligated due to steal syndrome.  He subsequently had a RUA axillary loop AVG placed earlier in 2022.    On 10/19/2022, he went to the ER for acute onset of pain and weakness of the right side.  Provider discussed with neurology and code stroke was not called.  He had CT to evaluate for dissection and this was negative.  He was discharged home with plans to f/u with vascular surgery.    He comes in today with c/o swelling in the right arm.  He states every time they use his graft, he has pain and swelling.  He states it is similar to his last episode when he had fistulogram with Dr. Donzetta Matters.  He states he is having trouble using his arm due to the swelling.  He states he has some pigmentation changes on his lower arm.  He would not let them stick his graft yesterday until this is evaluated.    About a week ago, he was taken to the ER with right arm weakness.  A code stroke was not called.  It was felt to be due to his graft.   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    Pt is [employer] and from [place]. He came to [place] to take care of his grandmother and stayed.   Dialysis access history: -left BC AVF and ligation for steal syndrome - [facility] -RUE axillary loop AVG 2022 -fistulogram and angioplasty - [facility] -venogram 08/10/2021 Dr. Trula Slade -right brachial artery to axillary vein HD graft 08/12/2021 Dr. Trula Slade -fistulogram with drug coated balloon angioplasty right innominate vein 08/30/2021 Dr. Donzetta Matters -fistulogram with stent of right innominate vein 11/08/2021 Dr. Donzetta Matters -fistulogram with drug coated balloon angioplasty of right innominate vein in stent restenosis and right SCV 07/28/2022 Dr. Donzetta Matters -fistulogram with stent of right innominate  vein 09/05/2022 Dr. Donzetta Matters   Pt is on dialysis.   Days of dialysis if applicable:  M/W/F    HD center:  [place] location.   The pt is on a statin for cholesterol management.  The pt is on a daily aspirin.  Other AC:  none The pt is on CCB, BB for hypertension.  The pt does have diabetic.   Tobacco hx:  former  Past Medical History:  Diagnosis Date   Arthritis    Blood transfusion without reported diagnosis    CHF (congestive heart failure) (HCC)    COPD (chronic obstructive pulmonary disease) ([place])    Diabetes mellitus without complication ([place])    Hypertension    PTSD (post-traumatic stress disorder)    Renal disorder    Stage 5 Kidney Failure    Past Surgical History:  Procedure Laterality Date   A/V FISTULAGRAM N/A 08/30/2021   Procedure: A/V FISTULAGRAM;  Surgeon: Waynetta Sandy, MD;  Location: [place] [facility];  Service: Cardiovascular;  Laterality: N/A;   A/V FISTULAGRAM Right 07/28/2022   Procedure: A/V Fistulagram;  Surgeon: Waynetta Sandy, MD;  Location: [place] [facility];  Service: Cardiovascular;  Laterality: Right;   A/V FISTULAGRAM Right 09/05/2022   Procedure: A/V Fistulagram;  Surgeon: Waynetta Sandy, MD;  Location: [place] [facility];  Service: Cardiovascular;  Laterality: Right;   A/V SHUNTOGRAM N/A 11/08/2021   Procedure: A/V SHUNTOGRAM;  Surgeon: Waynetta Sandy, MD;  Location: [place] [facility];  Service: Cardiovascular;  Laterality: N/A;   AMPUTATION Left 04/03/2021   Procedure: LEFT ABOVE-THE-KNEE AMPUTATION;  Surgeon: Erle Crocker, MD;  Location: [place];  Service: Orthopedics;  Laterality: Left;   AV FISTULA PLACEMENT Right 08/12/2021   Procedure: INSERTION OF ARTERIOVENOUS (AV) GORE-TEX GRAFT RIGHT ARM;  Surgeon: Serafina Mitchell, MD;  Location: [place];  Service: Vascular;  Laterality: Right;   IR REMOVAL TUN CV CATH W/O FL  03/30/2021   JOINT REPLACEMENT     Bilateral knees   PERIPHERAL VASCULAR  BALLOON ANGIOPLASTY Right 08/30/2021   Procedure: PERIPHERAL VASCULAR BALLOON ANGIOPLASTY;  Surgeon: Waynetta Sandy, MD;  Location: [place] [facility];  Service: Cardiovascular;  Laterality: Right;   PERIPHERAL VASCULAR BALLOON ANGIOPLASTY Right 07/28/2022   Procedure: PERIPHERAL VASCULAR BALLOON ANGIOPLASTY;  Surgeon: Waynetta Sandy, MD;  Location: [place] [facility];  Service: Cardiovascular;  Laterality: Right;  AVF   PERIPHERAL VASCULAR INTERVENTION  11/08/2021   Procedure: PERIPHERAL VASCULAR INTERVENTION;  Surgeon: Waynetta Sandy, MD;  Location: [place] [facility];  Service: Cardiovascular;;  Central rt arm fistula   UPPER EXTREMITY VENOGRAPHY Right 08/10/2021   Procedure: UPPER EXTREMITY VENOGRAPHY;  Surgeon: Serafina Mitchell, MD;  Location: [place] [facility];  Service: Cardiovascular;  Laterality: Right;    Allergies  Allergen Reactions   Actos [Pioglitazone] Anaphylaxis and Rash   Dexmedetomidine Nausea And Vomiting    (Precedex) Dose-limiting bradycardia     Ibuprofen Shortness Of Breath and Swelling    Pt tolerates aspirin    Tomato Anaphylaxis    Only allergic to RAW tomatoes   Wellbutrin [Bupropion] Swelling    Current Outpatient Medications  Medication Sig Dispense Refill   ACCU-CHEK GUIDE test strip 4 (four) times daily.     acetaminophen (TYLENOL) 325 MG tablet Take 2 tablets (650 mg total) by mouth every 6 (six) hours as needed for moderate pain. 20 tablet 0   albuterol (PROVENTIL) (2.5 MG/3ML) 0.083% nebulizer solution Inhale 2.5 mg into the lungs every 8 (eight) hours as needed for wheezing or shortness of breath.     albuterol (VENTOLIN HFA) 108 (90 Base) MCG/ACT inhaler Inhale 1-2 puffs into the lungs every 6 (six) hours as needed for wheezing or shortness of breath.     amLODipine (NORVASC) 10 MG tablet Take 10 mg by mouth every morning.     ASPIRIN LOW DOSE 81 MG EC tablet Take 81 mg by mouth in the morning.     atorvastatin  (LIPITOR) 40 MG tablet Take 40 mg by mouth in the morning.     calcium acetate (PHOSLO) 667 MG capsule Take 2 capsules (1,334 mg total) by mouth with breakfast, with lunch, and with evening meal.     carvedilol (COREG) 12.5 MG tablet Take 12.5 mg by mouth 2 (two) times daily with a meal.     Cholecalciferol 50 MCG (2000 UT) CAPS Take 2,000 Units by mouth in the morning.     Continuous Blood Gluc Receiver (DEXCOM G6 RECEIVER) DEVI Use 1 Device continuously     cyclobenzaprine (FLEXERIL) 10 MG tablet Take 1 tablet (10 mg total) by mouth 2 (two) times daily as needed for muscle spasms. 20 tablet 0   diazepam (VALIUM) 5 MG tablet Take 5 mg by mouth in the morning and at bedtime.     diphenhydrAMINE (BENADRYL)  25 MG tablet Take 50 mg by mouth every 6 (six) hours as needed for itching.     HEPARIN SODIUM, PORCINE, IV Heparin Sodium (Porcine) 1,000 Units/mL Systemic     insulin regular human CONCENTRATED (HUMULIN R U-500 KWIKPEN) 500 UNIT/ML KwikPen Inject 45 Units into the skin daily with supper. (Patient taking differently: Inject 150 Units into the skin 2 (two) times daily with a meal.) 2.7 mL 0   Methoxy PEG-Epoetin Beta (MIRCERA IJ) Mircera     metolazone (ZAROXOLYN) 10 MG tablet Take 10 mg by mouth daily.     nortriptyline (PAMELOR) 10 MG capsule Take 20 mg by mouth at bedtime.     pantoprazole (PROTONIX) 40 MG tablet Take 40 mg by mouth daily before breakfast.     polyethylene glycol powder (GLYCOLAX/MIRALAX) 17 GM/SCOOP powder Take 17 g by mouth daily as needed for mild constipation.     pregabalin (LYRICA) 75 MG capsule Take 75 mg by mouth in the morning.     senna-docusate (SENOKOT-S) 8.6-50 MG tablet Take 2 tablets by mouth 2 (two) times daily as needed for mild constipation.     sertraline (ZOLOFT) 100 MG tablet Take 100 mg by mouth in the morning.     tamsulosin (FLOMAX) 0.4 MG CAPS capsule Take 1 capsule (0.4 mg total) by mouth daily. 30 capsule 0   torsemide (DEMADEX) 20 MG tablet Take 100  mg by mouth 2 (two) times daily.     traZODone (DESYREL) 150 MG tablet Take 150 mg by mouth at bedtime.     No current facility-administered medications for this visit.    No family history on file.  Social History   Socioeconomic History   Marital status: Married    Spouse name: Not on file   Number of children: Not on file   Years of education: Not on file   Highest education level: Not on file  Occupational History   Occupation: disabled  Tobacco Use   Smoking status: Former    Types: Cigarettes    Quit date: 2012    Years since quitting: 11.9   Smokeless tobacco: Never  Vaping Use   Vaping Use: Never used  Substance and Sexual Activity   Alcohol use: Yes    Comment: occasionally   Drug use: Not Currently    Comment: Years ago   Sexual activity: Not on file  Other Topics Concern   Not on file  Social History Narrative   Not on file   Social Determinants of Health   Financial Resource Strain: Not on file  Food Insecurity: Not on file  Transportation Needs: Not on file  Physical Activity: Not on file  Stress: Not on file  Social Connections: Not on file  Intimate Partner Violence: Not on file     ROS: [x]  Positive   [ ]  Negative   [ ]  All sytems reviewed and are negative  Cardiac: [x]  chest pain/pressure [x]  SOB/DOE  Vascular: [x]  arm swelling right arm   Pulmonary: []  asthma []  wheezing  Neurologic: []  hx CVA/TIA  Hematologic: []  bleeding problems  GI []  GERD  GU: [x]  CKD/renal failure  [x]  HD---[x]  M/W/F []  T/T/S  Psychiatric: []  hx of major depression  Integumentary: [x]  rashes pigmentation changes right arm []  ulcers  Constitutional: []  fever []  chills   PHYSICAL EXAMINATION:  Today's Vitals   10/27/22 1219  BP: 133/63  Pulse: 83  Resp: 20  Temp: 98.3 F (36.8 C)  SpO2: 90%  Weight: (!) 345 lb  7.4 oz (156.7 kg)  Height: 5\' 5"  (1.651 m)  PainSc: 7    Body mass index is 57.49 kg/m.    General:  WDWN male in  NAD Gait: Not observed HENT: WNL Pulmonary: normal non-labored breathing  Cardiac: regular, without carotid bruits Abdomen: soft, NT Skin: without rashes; pigmentation changes right forearm Vascular Exam/Pulses:  Right radial pulse is not palpable; left radial is 2+ Extremities:  RUE AVG is pulsatile; right arm swelling present Musculoskeletal: no muscle wasting or atrophy  Neurologic: A&O X 3  Non-Invasive Vascular Imaging:   Dialysis duplex on 10/27/2022: +--------------------+----------+-----------------+------------------+  AVG                PSV (cm/s)Flow Vol (mL/min)     Describe       +--------------------+----------+-----------------+------------------+  Native artery inflow   271          1344                           +--------------------+----------+-----------------+------------------+  Arterial anastomosis   360                                         +--------------------+----------+-----------------+------------------+  Prox graft             410                                         +--------------------+----------+-----------------+------------------+  Mid graft              226                     change in diameter  +--------------------+----------+-----------------+------------------+  Distal graft           296                                         +--------------------+----------+-----------------+------------------+  Venous anastomosis     220                                         +--------------------+----------+-----------------+------------------+  Venous outflow         166                                         +--------------------+----------+-----------------+------------------+     ASSESSMENT/PLAN: 61 y.o. male with ESRD here for evaluation of his hemodialysis access with hx of ESRD with   left BC AVF created at [facility] and was ligated due to steal syndrome.  He subsequently had a RUA axillary loop AVG  placed earlier in 2022 here for right arm swelling.     -pt with RUE AVG and right arm swelling that he states is similar to previous swelling.  Will plan for fistulogram on Monday 10/31/2022 with Dr. Donzetta Matters with possible intervention and possible TDC placement.  -pt is on dialysis on M/W/F at [place] location -discussed with pt that access does not last forever and will  need intervention or even new access at some point.     Leontine Locket, [facility] [phone]  Clinic MD:   Stanford Breed on call MD

## 2022-10-25 ENCOUNTER — Ambulatory Visit: Payer: Medicare Other | Admitting: Vascular Surgery

## 2022-10-25 ENCOUNTER — Encounter (HOSPITAL_COMMUNITY): Payer: Medicare Other

## 2022-10-27 ENCOUNTER — Other Ambulatory Visit: Payer: Self-pay

## 2022-10-27 ENCOUNTER — Ambulatory Visit (INDEPENDENT_AMBULATORY_CARE_PROVIDER_SITE_OTHER): Payer: Medicare Other | Admitting: Physician Assistant

## 2022-10-27 ENCOUNTER — Ambulatory Visit (HOSPITAL_COMMUNITY)
Admission: RE | Admit: 2022-10-27 | Discharge: 2022-10-27 | Disposition: A | Discharge status: Home / Self Care | Payer: Medicare Other | Source: Ambulatory Visit | Attending: Vascular Surgery | Admitting: Vascular Surgery

## 2022-10-27 VITALS — BP 133/63 | HR 83 | Temp 98.3°F | Resp 20 | Ht 65.0 in | Wt 345.5 lb

## 2022-10-27 DIAGNOSIS — N186 End stage renal disease: Secondary | ICD-10-CM | POA: Insufficient documentation

## 2022-10-27 DIAGNOSIS — Z992 Dependence on renal dialysis: Secondary | ICD-10-CM | POA: Diagnosis not present

## 2022-10-27 MED ORDER — SODIUM CHLORIDE 0.9% FLUSH: 3.0000 mL | Freq: Two times a day (BID) | INTRAVENOUS | Status: DC

## 2022-10-27 MED ORDER — SODIUM CHLORIDE 0.9% FLUSH: 3.0000 mL | INTRAVENOUS | Status: DC | PRN

## 2022-10-27 MED ORDER — SODIUM CHLORIDE 0.9 % IV SOLN: 250.0000 mL | INTRAVENOUS | Status: DC | PRN

## 2022-10-31 ENCOUNTER — Inpatient Hospital Stay (HOSPITAL_COMMUNITY)
Admission: RE | Disposition: A | Discharge status: Home / Self Care | Payer: Self-pay | Source: Home / Self Care | Attending: Vascular Surgery

## 2022-10-31 ENCOUNTER — Inpatient Hospital Stay (HOSPITAL_COMMUNITY)
Admission: RE | Admit: 2022-10-31 | Discharge: 2022-11-03 | DRG: 252 | Disposition: A | Discharge status: Home / Self Care | Payer: Medicare Other | Attending: Vascular Surgery | Admitting: Vascular Surgery

## 2022-10-31 ENCOUNTER — Other Ambulatory Visit: Payer: Self-pay

## 2022-10-31 DIAGNOSIS — J449 Chronic obstructive pulmonary disease, unspecified: Secondary | ICD-10-CM | POA: Diagnosis present

## 2022-10-31 DIAGNOSIS — Z6841 Body Mass Index (BMI) 40.0 and over, adult: Secondary | ICD-10-CM

## 2022-10-31 DIAGNOSIS — Z89612 Acquired absence of left leg above knee: Secondary | ICD-10-CM

## 2022-10-31 DIAGNOSIS — I120 Hypertensive chronic kidney disease with stage 5 chronic kidney disease or end stage renal disease: Secondary | ICD-10-CM | POA: Diagnosis present

## 2022-10-31 DIAGNOSIS — F4310 Post-traumatic stress disorder, unspecified: Secondary | ICD-10-CM | POA: Diagnosis present

## 2022-10-31 DIAGNOSIS — T82858A Stenosis of vascular prosthetic devices, implants and grafts, initial encounter: Principal | ICD-10-CM | POA: Diagnosis present

## 2022-10-31 DIAGNOSIS — E1165 Type 2 diabetes mellitus with hyperglycemia: Secondary | ICD-10-CM | POA: Diagnosis not present

## 2022-10-31 DIAGNOSIS — N185 Chronic kidney disease, stage 5: Secondary | ICD-10-CM

## 2022-10-31 DIAGNOSIS — Z888 Allergy status to other drugs, medicaments and biological substances status: Secondary | ICD-10-CM

## 2022-10-31 DIAGNOSIS — Y828 Other medical devices associated with adverse incidents: Secondary | ICD-10-CM | POA: Diagnosis present

## 2022-10-31 DIAGNOSIS — M1990 Unspecified osteoarthritis, unspecified site: Secondary | ICD-10-CM | POA: Diagnosis present

## 2022-10-31 DIAGNOSIS — E1122 Type 2 diabetes mellitus with diabetic chronic kidney disease: Secondary | ICD-10-CM | POA: Diagnosis present

## 2022-10-31 DIAGNOSIS — Z91018 Allergy to other foods: Secondary | ICD-10-CM

## 2022-10-31 DIAGNOSIS — Z79899 Other long term (current) drug therapy: Secondary | ICD-10-CM

## 2022-10-31 DIAGNOSIS — Z7982 Long term (current) use of aspirin: Secondary | ICD-10-CM

## 2022-10-31 DIAGNOSIS — N186 End stage renal disease: Secondary | ICD-10-CM | POA: Diagnosis present

## 2022-10-31 DIAGNOSIS — Z87891 Personal history of nicotine dependence: Secondary | ICD-10-CM

## 2022-10-31 DIAGNOSIS — Z96651 Presence of right artificial knee joint: Secondary | ICD-10-CM | POA: Diagnosis present

## 2022-10-31 DIAGNOSIS — T82898A Other specified complication of vascular prosthetic devices, implants and grafts, initial encounter: Secondary | ICD-10-CM

## 2022-10-31 DIAGNOSIS — Z794 Long term (current) use of insulin: Secondary | ICD-10-CM

## 2022-10-31 DIAGNOSIS — E876 Hypokalemia: Secondary | ICD-10-CM | POA: Diagnosis present

## 2022-10-31 DIAGNOSIS — Z992 Dependence on renal dialysis: Secondary | ICD-10-CM

## 2022-10-31 HISTORY — PX: A/V FISTULAGRAM: CATH118298

## 2022-10-31 HISTORY — PX: A/V SHUNT INTERVENTION: CATH118220

## 2022-10-31 LAB — POCT I-STAT, CHEM 8
BUN: >130 mg/dL — ABNORMAL HIGH (ref 8–23)
Calcium, Ion: 0.96 mmol/L — ABNORMAL LOW (ref 1.15–1.40)
Chloride: 88 mmol/L — ABNORMAL LOW (ref 98–111)
Creatinine, Ser: 6.4 mg/dL — ABNORMAL HIGH (ref 0.61–1.24)
Glucose, Bld: 258 mg/dL — ABNORMAL HIGH (ref 70–99)
HCT: 31 % — ABNORMAL LOW (ref 39.0–52.0)
Hemoglobin: 10.5 g/dL — ABNORMAL LOW (ref 13.0–17.0)
Potassium: 3.1 mmol/L — ABNORMAL LOW (ref 3.5–5.1)
Sodium: 135 mmol/L (ref 135–145)
TCO2: 33 mmol/L — ABNORMAL HIGH (ref 22–32)

## 2022-10-31 LAB — CBC
HCT: 31.9 % — ABNORMAL LOW (ref 39.0–52.0)
Hemoglobin: 10.3 g/dL — ABNORMAL LOW (ref 13.0–17.0)
MCH: 28.6 pg (ref 26.0–34.0)
MCHC: 32.3 g/dL (ref 30.0–36.0)
MCV: 88.6 fL (ref 80.0–100.0)
Platelets: 126 10*3/uL — ABNORMAL LOW (ref 150–400)
RBC: 3.6 MIL/uL — ABNORMAL LOW (ref 4.22–5.81)
RDW: 14.9 % (ref 11.5–15.5)
WBC: 6.2 10*3/uL (ref 4.0–10.5)
nRBC: 0 % (ref 0.0–0.2)

## 2022-10-31 LAB — CREATININE, SERUM
Creatinine, Ser: 6.59 mg/dL — ABNORMAL HIGH (ref 0.61–1.24)
GFR, Estimated: 9 mL/min — ABNORMAL LOW (ref >60)

## 2022-10-31 LAB — GLUCOSE, CAPILLARY
Glucose-Capillary: 237 mg/dL — ABNORMAL HIGH (ref 70–99)
Glucose-Capillary: 396 mg/dL — ABNORMAL HIGH (ref 70–99)

## 2022-10-31 SURGERY — A/V FISTULAGRAM
Anesthesia: LOCAL | Laterality: Right

## 2022-10-31 MED ORDER — SODIUM CHLORIDE 0.9% FLUSH: 3.0000 mL | INTRAVENOUS | Status: DC | PRN

## 2022-10-31 MED ORDER — INSULIN REGULAR HUMAN (CONC) 500 UNIT/ML ~~LOC~~ SOPN
70.0000 [IU] | PEN_INJECTOR | Freq: Two times a day (BID) | SUBCUTANEOUS | Status: DC
  Filled 2022-10-31: qty 3

## 2022-10-31 MED ORDER — IODIXANOL 320 MG/ML IV SOLN
INTRAVENOUS | Status: DC | PRN
  Administered 2022-10-31: 40 mL via INTRAVENOUS

## 2022-10-31 MED ORDER — INSULIN ASPART 100 UNIT/ML IJ SOLN
0.0000 [IU] | Freq: Every day | INTRAMUSCULAR | Status: DC
  Administered 2022-10-31: 5 [IU] via SUBCUTANEOUS

## 2022-10-31 MED ORDER — HYDRALAZINE HCL 20 MG/ML IJ SOLN: 5.0000 mg | INTRAMUSCULAR | Status: DC | PRN

## 2022-10-31 MED ORDER — CALCIUM ACETATE (PHOS BINDER) 667 MG PO CAPS
1334.0000 mg | ORAL_CAPSULE | Freq: Three times a day (TID) | ORAL | Status: DC
  Administered 2022-10-31 – 2022-11-03 (×6): 1334 mg via ORAL
  Filled 2022-10-31 (×6): qty 2

## 2022-10-31 MED ORDER — TORSEMIDE 20 MG PO TABS
100.0000 mg | ORAL_TABLET | Freq: Two times a day (BID) | ORAL | Status: DC
  Administered 2022-10-31 – 2022-11-03 (×6): 100 mg via ORAL
  Filled 2022-10-31 (×6): qty 5

## 2022-10-31 MED ORDER — PANTOPRAZOLE SODIUM 40 MG PO TBEC
40.0000 mg | DELAYED_RELEASE_TABLET | Freq: Every day | ORAL | Status: DC
  Administered 2022-11-01 – 2022-11-03 (×3): 40 mg via ORAL
  Filled 2022-10-31 (×3): qty 1

## 2022-10-31 MED ORDER — CARVEDILOL 12.5 MG PO TABS
12.5000 mg | ORAL_TABLET | Freq: Two times a day (BID) | ORAL | Status: DC
  Administered 2022-10-31 – 2022-11-03 (×5): 12.5 mg via ORAL
  Filled 2022-10-31 (×5): qty 1

## 2022-10-31 MED ORDER — SENNOSIDES-DOCUSATE SODIUM 8.6-50 MG PO TABS: 2.0000 | ORAL_TABLET | Freq: Two times a day (BID) | ORAL | Status: DC | PRN

## 2022-10-31 MED ORDER — TAMSULOSIN HCL 0.4 MG PO CAPS: 0.4000 mg | ORAL_CAPSULE | Freq: Every day | ORAL | Status: DC

## 2022-10-31 MED ORDER — ASPIRIN 81 MG PO TBEC
81.0000 mg | DELAYED_RELEASE_TABLET | Freq: Every morning | ORAL | Status: DC
  Administered 2022-11-01 – 2022-11-03 (×3): 81 mg via ORAL
  Filled 2022-10-31 (×3): qty 1

## 2022-10-31 MED ORDER — LABETALOL HCL 5 MG/ML IV SOLN: 10.0000 mg | INTRAVENOUS | Status: DC | PRN

## 2022-10-31 MED ORDER — POLYETHYLENE GLYCOL 3350 17 GM/SCOOP PO POWD: 17.0000 g | Freq: Every day | ORAL | Status: DC | PRN

## 2022-10-31 MED ORDER — CYCLOBENZAPRINE HCL 10 MG PO TABS: 10.0000 mg | ORAL_TABLET | Freq: Two times a day (BID) | ORAL | Status: DC | PRN

## 2022-10-31 MED ORDER — ALBUTEROL SULFATE HFA 108 (90 BASE) MCG/ACT IN AERS: 1.0000 | INHALATION_SPRAY | Freq: Four times a day (QID) | RESPIRATORY_TRACT | Status: DC | PRN

## 2022-10-31 MED ORDER — LIDOCAINE HCL (PF) 1 % IJ SOLN
INTRAMUSCULAR | Status: DC | PRN
  Administered 2022-10-31: 5 mL via SUBCUTANEOUS

## 2022-10-31 MED ORDER — SERTRALINE HCL 100 MG PO TABS
100.0000 mg | ORAL_TABLET | Freq: Every morning | ORAL | Status: DC
  Administered 2022-11-01 – 2022-11-03 (×3): 100 mg via ORAL
  Filled 2022-10-31 (×3): qty 1

## 2022-10-31 MED ORDER — CLOPIDOGREL BISULFATE 75 MG PO TABS
75.0000 mg | ORAL_TABLET | Freq: Every day | ORAL | Status: DC
  Administered 2022-11-01 – 2022-11-03 (×3): 75 mg via ORAL
  Filled 2022-10-31 (×3): qty 1

## 2022-10-31 MED ORDER — ONDANSETRON HCL 4 MG/2ML IJ SOLN
4.0000 mg | Freq: Four times a day (QID) | INTRAMUSCULAR | Status: DC | PRN
  Administered 2022-11-01 – 2022-11-02 (×3): 4 mg via INTRAVENOUS
  Filled 2022-10-31 (×3): qty 2

## 2022-10-31 MED ORDER — CLOPIDOGREL BISULFATE 75 MG PO TABS
300.0000 mg | ORAL_TABLET | Freq: Once | ORAL | Status: AC
  Administered 2022-10-31: 300 mg via ORAL
  Filled 2022-10-31: qty 4

## 2022-10-31 MED ORDER — ALBUTEROL SULFATE (2.5 MG/3ML) 0.083% IN NEBU: 2.5000 mg | INHALATION_SOLUTION | Freq: Three times a day (TID) | RESPIRATORY_TRACT | Status: DC | PRN

## 2022-10-31 MED ORDER — TRAZODONE HCL 150 MG PO TABS: 150.0000 mg | ORAL_TABLET | Freq: Every evening | ORAL | Status: DC | PRN

## 2022-10-31 MED ORDER — DIPHENHYDRAMINE HCL 25 MG PO CAPS: 50.0000 mg | ORAL_CAPSULE | Freq: Four times a day (QID) | ORAL | Status: DC | PRN

## 2022-10-31 MED ORDER — AMLODIPINE BESYLATE 10 MG PO TABS
10.0000 mg | ORAL_TABLET | Freq: Every morning | ORAL | Status: DC
  Administered 2022-11-01 – 2022-11-03 (×3): 10 mg via ORAL
  Filled 2022-10-31 (×3): qty 1

## 2022-10-31 MED ORDER — MIDAZOLAM HCL 2 MG/2ML IJ SOLN
INTRAMUSCULAR | Status: AC
  Filled 2022-10-31: qty 2

## 2022-10-31 MED ORDER — SODIUM CHLORIDE 0.9 % IV SOLN: 250.0000 mL | INTRAVENOUS | Status: DC | PRN

## 2022-10-31 MED ORDER — MIDAZOLAM HCL 2 MG/2ML IJ SOLN
INTRAMUSCULAR | Status: DC | PRN
  Administered 2022-10-31: 1 mg via INTRAVENOUS

## 2022-10-31 MED ORDER — LIDOCAINE HCL (PF) 1 % IJ SOLN
INTRAMUSCULAR | Status: AC
  Filled 2022-10-31: qty 30

## 2022-10-31 MED ORDER — INSULIN REGULAR HUMAN (CONC) 500 UNIT/ML ~~LOC~~ SOPN
75.0000 [IU] | PEN_INJECTOR | Freq: Two times a day (BID) | SUBCUTANEOUS | Status: DC
  Administered 2022-10-31 – 2022-11-02 (×4): 75 [IU] via SUBCUTANEOUS
  Filled 2022-10-31: qty 3

## 2022-10-31 MED ORDER — HEPARIN SODIUM (PORCINE) 5000 UNIT/ML IJ SOLN
5000.0000 [IU] | Freq: Three times a day (TID) | INTRAMUSCULAR | Status: DC
  Administered 2022-10-31 – 2022-11-03 (×8): 5000 [IU] via SUBCUTANEOUS
  Filled 2022-10-31 (×8): qty 1

## 2022-10-31 MED ORDER — HEPARIN SODIUM (PORCINE) 1000 UNIT/ML IJ SOLN
INTRAMUSCULAR | Status: AC
  Filled 2022-10-31: qty 10

## 2022-10-31 MED ORDER — TAMSULOSIN HCL 0.4 MG PO CAPS
0.4000 mg | ORAL_CAPSULE | Freq: Every day | ORAL | Status: DC
  Administered 2022-11-01 – 2022-11-03 (×3): 0.4 mg via ORAL
  Filled 2022-10-31 (×3): qty 1

## 2022-10-31 MED ORDER — PREGABALIN 75 MG PO CAPS
75.0000 mg | ORAL_CAPSULE | Freq: Every morning | ORAL | Status: DC
  Administered 2022-11-01 – 2022-11-03 (×3): 75 mg via ORAL
  Filled 2022-10-31 (×3): qty 1

## 2022-10-31 MED ORDER — CLOPIDOGREL BISULFATE 75 MG PO TABS: 75.0000 mg | ORAL_TABLET | Freq: Every day | ORAL | Status: DC

## 2022-10-31 MED ORDER — POLYETHYLENE GLYCOL 3350 17 G PO PACK: 17.0000 g | PACK | Freq: Every day | ORAL | Status: DC | PRN

## 2022-10-31 MED ORDER — FENTANYL CITRATE (PF) 100 MCG/2ML IJ SOLN
INTRAMUSCULAR | Status: DC | PRN
  Administered 2022-10-31: 25 ug via INTRAVENOUS

## 2022-10-31 MED ORDER — SODIUM CHLORIDE 0.9% FLUSH
3.0000 mL | Freq: Two times a day (BID) | INTRAVENOUS | Status: DC
  Administered 2022-10-31 – 2022-11-03 (×5): 3 mL via INTRAVENOUS

## 2022-10-31 MED ORDER — ATORVASTATIN CALCIUM 40 MG PO TABS
40.0000 mg | ORAL_TABLET | Freq: Every morning | ORAL | Status: DC
  Administered 2022-11-01 – 2022-11-03 (×3): 40 mg via ORAL
  Filled 2022-10-31 (×3): qty 1

## 2022-10-31 MED ORDER — HEPARIN (PORCINE) IN NACL 1000-0.9 UT/500ML-% IV SOLN
INTRAVENOUS | Status: DC | PRN
  Administered 2022-10-31: 500 mL

## 2022-10-31 MED ORDER — INSULIN ASPART 100 UNIT/ML IJ SOLN
0.0000 [IU] | Freq: Three times a day (TID) | INTRAMUSCULAR | Status: DC
  Administered 2022-11-01: 4 [IU] via SUBCUTANEOUS
  Administered 2022-11-01: 7 [IU] via SUBCUTANEOUS
  Administered 2022-11-01: 11 [IU] via SUBCUTANEOUS
  Administered 2022-11-03: 7 [IU] via SUBCUTANEOUS

## 2022-10-31 MED ORDER — FENTANYL CITRATE (PF) 100 MCG/2ML IJ SOLN
INTRAMUSCULAR | Status: AC
  Filled 2022-10-31: qty 2

## 2022-10-31 MED ORDER — ACETAMINOPHEN 325 MG PO TABS: 650.0000 mg | ORAL_TABLET | Freq: Four times a day (QID) | ORAL | Status: DC | PRN

## 2022-10-31 MED ORDER — NORTRIPTYLINE HCL 10 MG PO CAPS
20.0000 mg | ORAL_CAPSULE | Freq: Every day | ORAL | Status: DC
  Administered 2022-10-31 – 2022-11-03 (×3): 20 mg via ORAL
  Filled 2022-10-31 (×5): qty 2

## 2022-10-31 MED ORDER — ACETAMINOPHEN 325 MG PO TABS: 650.0000 mg | ORAL_TABLET | ORAL | Status: DC | PRN

## 2022-10-31 MED ORDER — METOLAZONE 5 MG PO TABS
20.0000 mg | ORAL_TABLET | Freq: Every day | ORAL | Status: DC
  Administered 2022-11-01 – 2022-11-03 (×3): 20 mg via ORAL
  Filled 2022-10-31 (×3): qty 4

## 2022-10-31 MED ORDER — MORPHINE SULFATE (PF) 2 MG/ML IV SOLN
2.0000 mg | INTRAVENOUS | Status: DC | PRN
  Administered 2022-11-02 – 2022-11-03 (×2): 2 mg via INTRAVENOUS
  Filled 2022-10-31 (×2): qty 1

## 2022-10-31 MED ORDER — OXYCODONE HCL 5 MG PO TABS
5.0000 mg | ORAL_TABLET | ORAL | Status: DC | PRN
  Administered 2022-10-31: 10 mg via ORAL
  Administered 2022-10-31: 5 mg via ORAL
  Administered 2022-11-01 – 2022-11-02 (×6): 10 mg via ORAL
  Filled 2022-10-31 (×7): qty 2
  Filled 2022-10-31: qty 1

## 2022-10-31 SURGICAL SUPPLY — 17 items
BAG SNAP BAND KOVER 36X36 (MISCELLANEOUS) ×1 IMPLANT
BALLN LUTONIX AV 9X60X75 (BALLOONS) ×1
BALLN MUSTANG 10X60X75 (BALLOONS) ×1
BALLOON LUTONIX AV 9X60X75 (BALLOONS) IMPLANT
BALLOON MUSTANG 10X60X75 (BALLOONS) IMPLANT
COVER DOME SNAP 22 D (MISCELLANEOUS) ×1 IMPLANT
KIT ENCORE 26 ADVANTAGE (KITS) IMPLANT
KIT MICROPUNCTURE NIT STIFF (SHEATH) IMPLANT
PROTECTION STATION PRESSURIZED (MISCELLANEOUS) ×1
SHEATH PINNACLE R/O II 5F 6CM (SHEATH) IMPLANT
SHEATH PINNACLE R/O II 6F 4CM (SHEATH) IMPLANT
SHEATH PROBE COVER 6X72 (BAG) ×1 IMPLANT
STATION PROTECTION PRESSURIZED (MISCELLANEOUS) ×1 IMPLANT
STOPCOCK MORSE 400PSI 3WAY (MISCELLANEOUS) ×1 IMPLANT
TRAY PV CATH (CUSTOM PROCEDURE TRAY) ×1 IMPLANT
TUBING CIL FLEX 10 FLL-RA (TUBING) ×1 IMPLANT
WIRE BENTSON .035X145CM (WIRE) IMPLANT

## 2022-10-31 NOTE — Progress Notes (Signed)
Contacted by provider regarding pt needing out-pt HD treatment tomorrow. Pt to spend the night with the hope of pt being d/c tomorrow. Contacted [place] and spoke to [place]. Advised that clinic does not have a 2nd shift appt available tomorrow. Clinic only had an early first shift available which be very difficult for pt to d/c and pt's wife to get back in town to transport pt to clinic that early in the morning. Update provided to provider who plans to contact nephrology.   Melven Sartorius Renal Navigator [phone]

## 2022-10-31 NOTE — Interval H&P Note (Signed)
History and Physical Interval Note:  10/31/2022 11:14 AM  Jeffrey [name]  has presented today for surgery, with the diagnosis of end stage renal disease.  The various methods of treatment have been discussed with the patient and family. After consideration of risks, benefits and other options for treatment, the patient has consented to  Procedure(s): A/V Fistulagram (N/A) as a surgical intervention.  The patient's history has been reviewed, patient examined, no change in status, stable for surgery.  I have reviewed the patient's chart and labs.  Questions were answered to the patient's satisfaction.     Servando Snare

## 2022-10-31 NOTE — Op Note (Signed)
    Patient name: Jeffrey [name] MRN: [id number] DOB: [birth year] Sex: male  10/31/2022 Pre-operative Diagnosis: End-stage renal disease, malfunction right upper arm AV graft with swelling and graft pulsatility Post-operative diagnosis:  Same Surgeon:  Eda Paschal. Donzetta Matters, MD Procedure Performed: 1.  Ultrasound-guided cannulation right arm AV graft 2.  Right upper extremity shuntogram 3.  Drug-coated balloon angioplasty right innominate vein stent with 9 x 60 mm lLutonix 4.  Moderate patient with fentanyl and Versed for 25 minutes   Indications: 61 year old male with history of end-stage disease on dialysis on Mondays, Wednesday Fridays via right upper extremity AV graft.  He has recently undergone right innominate vein stenting and drug-coated balloon angioplasty before this of a previously placed stent and now again has malfunction of the right upper extremity graft and is indicated for fistulogram with possible invention.  Findings: There is approximately 70% stenosis of proximal area of the most recently placed stent.  This was ballooned first with a 10 mm balloon and then drug-coated balloon 9 mm.  At completion there was no residual stenosis.  The patient continues to have issues with the graft will need to consider alternative access.    Procedure:  The patient was identified in the holding area and taken to room 8.  The patient was then placed supine on the table and prepped and draped in the usual sterile fashion.  A time out was called.  Ultrasound was used to evaluate the extremity right arm AV graft and the area was anesthetized 1% lidocaine and cannulated with a micropuncture needle followed by wire and the sheath.  An image was saved to the permanent record.  After extremity shuntogram was performed.  Concomitantly we administered moderate sedation with fentanyl and Versed and his vital signs were monitored by bedside nursing throughout the case.  After shuntogram was performed we placed a  Bentson wire followed by a 6 French sheath and then performed primary balloon angioplasty of the stenosed area proximally of the stent with a 10 mm balloon and performed retrograde imaging during this time.  We then performed drug-coated balloon angioplasty with a 9 mm balloon and completion demonstrated no residual stenosis.  Satisfied with this we remove the wire and catheters and then suture-ligated the cannulation site and the sheath was removed.  The arm was then wrapped with Ace bandage.  Patient tolerated procedure well without any complication.  Contrast: 40cc   Penelope Fittro C. Donzetta Matters, MD [facility] [place] Office: [phone] Pager: [phone]

## 2022-11-01 ENCOUNTER — Encounter (HOSPITAL_COMMUNITY): Payer: Self-pay | Admitting: Vascular Surgery

## 2022-11-01 LAB — BASIC METABOLIC PANEL
Anion gap: 17 — ABNORMAL HIGH (ref 5–15)
BUN: 132 mg/dL — ABNORMAL HIGH (ref 8–23)
CO2: 30 mmol/L (ref 22–32)
Calcium: 7.8 mg/dL — ABNORMAL LOW (ref 8.9–10.3)
Chloride: 86 mmol/L — ABNORMAL LOW (ref 98–111)
Creatinine, Ser: 6.37 mg/dL — ABNORMAL HIGH (ref 0.61–1.24)
GFR, Estimated: 9 mL/min — ABNORMAL LOW (ref >60)
Glucose, Bld: 371 mg/dL — ABNORMAL HIGH (ref 70–99)
Potassium: 2.9 mmol/L — ABNORMAL LOW (ref 3.5–5.1)
Sodium: 133 mmol/L — ABNORMAL LOW (ref 135–145)

## 2022-11-01 LAB — CBC
HCT: 32.4 % — ABNORMAL LOW (ref 39.0–52.0)
Hemoglobin: 10.7 g/dL — ABNORMAL LOW (ref 13.0–17.0)
MCH: 28.6 pg (ref 26.0–34.0)
MCHC: 33 g/dL (ref 30.0–36.0)
MCV: 86.6 fL (ref 80.0–100.0)
Platelets: 114 10*3/uL — ABNORMAL LOW (ref 150–400)
RBC: 3.74 MIL/uL — ABNORMAL LOW (ref 4.22–5.81)
RDW: 14.7 % (ref 11.5–15.5)
WBC: 5.6 10*3/uL (ref 4.0–10.5)
nRBC: 0 % (ref 0.0–0.2)

## 2022-11-01 LAB — GLUCOSE, CAPILLARY
Glucose-Capillary: 298 mg/dL — ABNORMAL HIGH (ref 70–99)
Glucose-Capillary: 211 mg/dL — ABNORMAL HIGH (ref 70–99)
Glucose-Capillary: 152 mg/dL — ABNORMAL HIGH (ref 70–99)
Glucose-Capillary: 111 mg/dL — ABNORMAL HIGH (ref 70–99)

## 2022-11-01 NOTE — Progress Notes (Signed)
Mobility Specialist Progress Note:   11/01/22 1400  Mobility  Activity Transferred to/from [employer]  Level of Assistance Standby assist, set-up cues, supervision of patient - no hands on  Assistive Device BSC  Distance Ambulated (ft) 2 ft  Activity Response Tolerated well  $Mobility charge 1 Mobility   Pt received in bed asking to use BSC. No complaints of pain. Left in bed with call bell in reach and all needs met.   Gareth Eagle Kajuan Guyton Mobility Specialist Please contact via [employer] or  Rehab Office at [phone]

## 2022-11-01 NOTE — Progress Notes (Addendum)
  Progress Note    11/01/2022 6:55 AM 1 Day Post-Op  Subjective:  no complaints.  Says his blood sugars run high at home and often in the 300's.   Dr. Donzetta Matters told him to leave bandage until dialysis.   Afebrile HR 70's-90's NSR 951'O-841'Y systolic 60% RA  Vitals:   10/31/22 2330 11/01/22 0400  BP:  (!) 114/53  Pulse:  77  Resp:  17  Temp:  98.1 F (36.7 C)  SpO2: 93% 94%    Physical Exam: General:  no distress Lungs:  non labored Incisions:  right arm with ace wrap and coban.  Extremities:  right fingers are warm and well perfused  CBC    Component Value Date/Time   WBC 5.6 11/01/2022 0220   RBC 3.74 (L) 11/01/2022 0220   HGB 10.7 (L) 11/01/2022 0220   HCT 32.4 (L) 11/01/2022 0220   PLT 114 (L) 11/01/2022 0220   MCV 86.6 11/01/2022 0220   MCH 28.6 11/01/2022 0220   MCHC 33.0 11/01/2022 0220   RDW 14.7 11/01/2022 0220   LYMPHSABS 0.9 10/19/2022 2253   MONOABS 0.7 10/19/2022 2253   EOSABS 0.1 10/19/2022 2253   BASOSABS 0.0 10/19/2022 2253    BMET    Component Value Date/Time   NA 133 (L) 11/01/2022 0220   K 2.9 (L) 11/01/2022 0220   CL 86 (L) 11/01/2022 0220   CO2 30 11/01/2022 0220   GLUCOSE 371 (H) 11/01/2022 0220   BUN 132 (H) 11/01/2022 0220   CREATININE 6.37 (H) 11/01/2022 0220   CALCIUM 7.8 (L) 11/01/2022 0220   GFRNONAA 9 (L) 11/01/2022 0220    INR    Component Value Date/Time   INR 1.1 10/19/2022 2253     Intake/Output Summary (Last 24 hours) at 11/01/2022 0655 Last data filed at 10/31/2022 2030 Gross per 24 hour  Intake --  Output 1000 ml  Net -1000 ml      Assessment/Plan:  61 y.o. male is s/p:  Shuntogram with drug coated balloon angioplasty right innominate vein stent  1 Day Post-Op   -pt doing well this am.   Right arm is wrapped and right fingers are warm and well perfused.  -there were not any spots available at his outpatient center today.  Plan is to discharge today and have HD at [facility] on 12/6.    -hyperglycemic-pt elevated blood sugars not unexpected.  He states he did not have insulin and then ate and received his insulin and was still elevated.  Mild hypokalemia-pt for HD tomorrow.  Discussed with Dr. Donzetta Matters and ok for discharge.     Leontine Locket, PA-C [facility] [phone] 11/01/2022 6:55 AM  I have independently interviewed and examined patient and agree with PA assessment and plan above. Ok to [employer] today with plan to resume hd tomorrow at [facility].  Brees Hounshell C. Donzetta Matters, MD [facility] [place] Office: [phone] Pager: [phone]

## 2022-11-01 NOTE — Discharge Summary (Signed)
Discharge Summary    Jeffrey [name] 08/24/1961 61 y.o. male  [id number]  Admission Date: 10/31/2022  Discharge Date: 11/03/2022    Physician: Thomes Lolling*  Admission Diagnosis: ESRD (end stage renal disease) ([place]) [N18.6]   HPI:   This is a 61 y.o. male  here for evaluation of his hemodialysis access.  Pt has hx of ESRD with   left BC AVF created at [facility] and was ligated due to steal syndrome.  He subsequently had a RUA axillary loop AVG placed earlier in 2022.     On 10/19/2022, he went to the ER for acute onset of pain and weakness of the right side.  Provider discussed with neurology and code stroke was not called.  He had CT to evaluate for dissection and this was negative.  He was discharged home with plans to f/u with vascular surgery.     He comes in today with c/o swelling in the right arm.  He states every time they use his graft, he has pain and swelling.  He states it is similar to his last episode when he had fistulogram with Dr. Donzetta Matters.  He states he is having trouble using his arm due to the swelling.  He states he has some pigmentation changes on his lower arm.  He would not let them stick his graft yesterday until this is evaluated.     About a week ago, he was taken to the ER with right arm weakness.  A code stroke was not called.  It was felt to be due to his graft.    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     Pt is [employer] and from [place]. He came to [place] to take care of his grandmother and stayed.    Dialysis access history: -left BC AVF and ligation for steal syndrome - [facility] -RUE axillary loop AVG 2022 -fistulogram and angioplasty - [facility] -venogram 08/10/2021 Dr. Trula Slade -right brachial artery to axillary vein HD graft 08/12/2021 Dr. Trula Slade -fistulogram with drug coated balloon angioplasty right innominate vein 08/30/2021 Dr. Donzetta Matters -fistulogram with stent of right innominate vein 11/08/2021 Dr.  Donzetta Matters -fistulogram with drug coated balloon angioplasty of right innominate vein in stent restenosis and right SCV 07/28/2022 Dr. Donzetta Matters -fistulogram with stent of right innominate vein 09/05/2022 Dr. Lamont Dowdy Course:  The patient was admitted to the hospital and taken to the operating room on 10/31/2022 and underwent: 1.  Ultrasound-guided cannulation right arm AV graft 2.  Right upper extremity shuntogram 3.  Drug-coated balloon angioplasty right innominate vein stent with 9 x 60 mm lLutonix 4.  Moderate patient with fentanyl and Versed for 25 minutes    Findings: There is approximately 70% stenosis of proximal area of the most recently placed stent.  This was ballooned first with a 10 mm balloon and then drug-coated balloon 9 mm.  At completion there was no residual stenosis.   The patient continues to have issues with the graft will need to consider alternative access.  The pt tolerated the procedure well and was transported to the PACU in good condition.   POD 1, pt was doing well.  He did not have pain in his fingers on the right.  Ace wrap and coban in place.   There were not any spots available at his outpatient center today.  Plan is to discharge today and have HD at [facility] on 12/6.   -hyperglycemic-pt elevated blood sugars not unexpected.  He states he did not have insulin and then ate and received his insulin and was still elevated.  Mild hypokalemia-pt for HD tomorrow.  Discussed with Dr. Donzetta Matters and ok for discharge.   POD 2, pt had right shoulder and neck pain.  Given this is where his stent placement was, a CBC was drawn and his hgb had improved.  Given he was not discharged he did have HD later that day.  POD 3, pt AVG has palpable thrill and AVG worked well at HD yesterday.  Home O2 ordered.  He is discharged home.    If his graft fails in the future, he may ultimately require conversion to right upper arm hero graft given issues with right innominate vein stenting     CBC    Component Value Date/Time   WBC 7.5 11/02/2022 0754   RBC 4.04 (L) 11/02/2022 0754   HGB 11.6 (L) 11/02/2022 0754   HCT 35.2 (L) 11/02/2022 0754   PLT 149 (L) 11/02/2022 0754   MCV 87.1 11/02/2022 0754   MCH 28.7 11/02/2022 0754   MCHC 33.0 11/02/2022 0754   RDW 14.7 11/02/2022 0754   LYMPHSABS 0.9 10/19/2022 2253   MONOABS 0.7 10/19/2022 2253   EOSABS 0.1 10/19/2022 2253   BASOSABS 0.0 10/19/2022 2253    BMET    Component Value Date/Time   NA 132 (L) 11/02/2022 0754   K 3.3 (L) 11/02/2022 0754   CL 83 (L) 11/02/2022 0754   CO2 31 11/02/2022 0754   GLUCOSE 78 11/02/2022 0754   BUN 136 (H) 11/02/2022 0754   CREATININE 7.30 (H) 11/02/2022 0754   CALCIUM 8.0 (L) 11/02/2022 0754   GFRNONAA 8 (L) 11/02/2022 0754      Discharge Instructions     Call MD for:  redness, tenderness, or signs of infection (pain, swelling, redness, odor or green/yellow discharge around incision site)   Complete by: As directed    Call MD for:  severe uncontrolled pain   Complete by: As directed    Call MD for:  temperature >100.4   Complete by: As directed    Diet - low sodium heart healthy   Complete by: As directed    Discharge wound care:   Complete by: As directed    Keep fistula access site clean and dry   Increase activity slowly   Complete by: As directed        Discharge Diagnosis:  ESRD (end stage renal disease) ([place]) [N18.6]  Secondary Diagnosis: Patient Active Problem List   Diagnosis Date Noted   Encounter for removal of sutures 07/15/2022   Stress reaction of bone 07/12/2022   Benign prostatic hyperplasia with weak urinary stream 12/30/2021   Chest pain 11/07/2021   Hypertensive urgency 11/07/2021   Swelling of right upper extremity 11/07/2021   S/P reverse total shoulder arthroplasty, right 10/27/2021   AV graft malfunction ([place]) 09/01/2021   Dialysis AV fistula malfunction, initial encounter ([place]) 08/30/2021   Mood disorder ([place]) 08/30/2021    Hypoglycemia 08/04/2021   Disorder of ligament, right ankle 08/03/2021   Pain in right ankle and joints of right foot 08/03/2021   Type 2 diabetes mellitus not at goal [facility]) 07/29/2021   Local infection due to central venous catheter, sequela 07/21/2021   Type 2 diabetes mellitus with hyperglycemia ([place]) 06/28/2021   Acquired absence of left leg below knee ([place]) 06/21/2021   Altered mental status 05/04/2021   S/P AKA (above knee amputation), left ([place]) 05/04/2021   Bacteremia 04/20/2021  Other specified joint disorders, left knee 04/20/2021   Iron deficiency anemia, unspecified 04/13/2021   Sepsis ([place])    Anemia of chronic disease    Infection of prosthetic left knee joint ([place])    MRSA bacteremia 03/30/2021   Left knee pain    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    Fever 03/29/2021   ESRD (end stage renal disease) ([place]) 03/29/2021   COPD (chronic obstructive pulmonary disease) ([place]) 03/29/2021   Hypertension complicating diabetes ([place]) 03/29/2021   Type 2 diabetes mellitus with hyperlipidemia ([place]) 03/29/2021   PTSD (post-traumatic stress disorder) 03/29/2021   CHF (congestive heart failure) ([place]) 03/29/2021   Hypokalemia 11/29/2020   Dyspnea, unspecified 10/12/2020   Pain in arm, unspecified 10/12/2020   Mild protein-calorie malnutrition ([place]) 09/06/2020   Status post cervical spinal fusion 08/31/2020   Controlled substance agreement signed 08/30/2020   Pain, unspecified 08/20/2020   Secondary hyperparathyroidism of renal origin ([place]) 08/15/2020   Allergy, unspecified, sequela 07/21/2020   Coagulation defect, unspecified ([place]) 07/21/2020   Encounter for immunization 07/21/2020   Personal history of anaphylaxis 07/21/2020   Cervical myelopathy ([place]) 07/03/2020   Bilateral bunions 05/08/2020   Hammer toes of both feet 05/08/2020   Onychomycosis of toenail 05/08/2020   Muscle cramps 05/07/2020   History of 2019 novel coronavirus disease (COVID-19)  11/06/2019   Cervical radiculopathy 10/28/2019   Volume overload 06/25/2019   Degenerative disc disease, cervical 04/02/2019   History of MRSA infection 10/04/2018   Spinal stenosis of lumbosacral region 08/28/2018   Primary osteoarthritis of both shoulders 02/12/2018   Rotator cuff arthropathy of both shoulders 02/12/2018   Lumbar degenerative disc disease 01/25/2018   Chronic midline low back pain without sciatica 12/15/2017   History of colon polyps 08/15/2016   Mild episode of recurrent major depressive disorder ([place]) 05/30/2016   Chronic depression 05/30/2016   Vitamin D deficiency 04/18/2016   Chronic insomnia 12/21/2015   Pulmonary hypertension, mild ([place]) 04/29/2014   Chronic gastritis without bleeding 01/13/2014   Diabetic polyneuropathy associated with type 2 diabetes mellitus ([place]) 01/13/2014   Chronic diastolic heart failure ([place]) 12/24/2013   OSA on CPAP 12/24/2013   Primary osteoarthritis of both knees 08/26/2013   Primary osteoarthritis of right knee 08/26/2013   Status post total bilateral knee replacement 05/02/2013   Mixed hyperlipidemia 07/30/2012   Class 3 severe obesity due to excess calories with serious comorbidity and body mass index (BMI) of 45.0 to 49.9 in adult ([place]) 07/27/2012   Gastroesophageal reflux disease without esophagitis 05/12/2008   Erectile dysfunction due to diseases classified elsewhere 01/19/2005   Past Medical History:  Diagnosis Date   Arthritis    Blood transfusion without reported diagnosis    CHF (congestive heart failure) ([place])    COPD (chronic obstructive pulmonary disease) ([place])    Diabetes mellitus without complication ([place])    Hypertension    PTSD (post-traumatic stress disorder)    Renal disorder    Stage 5 Kidney Failure     Allergies as of 11/03/2022       Reactions   Actos [pioglitazone] Anaphylaxis, Rash   Dexmedetomidine Nausea And Vomiting   (Precedex) Dose-limiting bradycardia   Ibuprofen Shortness Of Breath,  Swelling   Pt tolerates aspirin   Tomato Anaphylaxis   Only allergic to RAW tomatoes   Wellbutrin [bupropion] Swelling        Medication List     TAKE these medications    Accu-Chek Guide test strip Generic drug: glucose blood 4 (  four) times daily.   acetaminophen 325 MG tablet Commonly known as: TYLENOL Take 2 tablets (650 mg total) by mouth every 6 (six) hours as needed for moderate pain.   albuterol (2.5 MG/3ML) 0.083% nebulizer solution Commonly known as: PROVENTIL Inhale 2.5 mg into the lungs every 8 (eight) hours as needed for wheezing or shortness of breath.   albuterol 108 (90 Base) MCG/ACT inhaler Commonly known as: VENTOLIN HFA Inhale 1-2 puffs into the lungs every 6 (six) hours as needed for wheezing or shortness of breath.   amLODipine 10 MG tablet Commonly known as: NORVASC Take 10 mg by mouth every morning.   Aspirin Low Dose 81 MG tablet Generic drug: aspirin EC Take 81 mg by mouth in the morning.   atorvastatin 40 MG tablet Commonly known as: LIPITOR Take 40 mg by mouth in the morning.   calcium acetate 667 MG capsule Commonly known as: PHOSLO Take 2 capsules (1,334 mg total) by mouth with breakfast, with lunch, and with evening meal. What changed: when to take this   carvedilol 12.5 MG tablet Commonly known as: COREG Take 12.5 mg by mouth 2 (two) times daily with a meal.   clopidogrel 75 MG tablet Commonly known as: PLAVIX Take 1 tablet (75 mg total) by mouth daily.   cyclobenzaprine 10 MG tablet Commonly known as: FLEXERIL Take 1 tablet (10 mg total) by mouth 2 (two) times daily as needed for muscle spasms.   Dexcom G6 Receiver Devi Use 1 Device continuously   diazepam 5 MG tablet Commonly known as: VALIUM Take 5 mg by mouth in the morning and at bedtime.   diphenhydrAMINE 25 MG tablet Commonly known as: BENADRYL Take 50 mg by mouth every 6 (six) hours as needed for itching.   HumuLIN R U-500 KwikPen 500 UNIT/ML KwikPen Generic  drug: insulin regular human CONCENTRATED Inject 45 Units into the skin daily with supper. What changed:  how much to take when to take this additional instructions   metolazone 10 MG tablet Commonly known as: ZAROXOLYN Take 20 mg by mouth daily.   MIRCERA IJ Mircera   nortriptyline 10 MG capsule Commonly known as: PAMELOR Take 20 mg by mouth at bedtime.   pantoprazole 40 MG tablet Commonly known as: PROTONIX Take 40 mg by mouth daily before breakfast.   polyethylene glycol powder 17 GM/SCOOP powder Commonly known as: GLYCOLAX/MIRALAX Take 17 g by mouth daily as needed for mild constipation.   pregabalin 75 MG capsule Commonly known as: LYRICA Take 75 mg by mouth in the morning.   senna-docusate 8.6-50 MG tablet Commonly known as: Senokot-S Take 2 tablets by mouth 2 (two) times daily as needed for mild constipation.   sertraline 100 MG tablet Commonly known as: ZOLOFT Take 100 mg by mouth in the morning.   tamsulosin 0.4 MG Caps capsule Commonly known as: FLOMAX Take 1 capsule (0.4 mg total) by mouth daily.   torsemide 20 MG tablet Commonly known as: DEMADEX Take 100 mg by mouth 2 (two) times daily.   traZODone 150 MG tablet Commonly known as: DESYREL Take 150 mg by mouth at bedtime as needed for sleep.   VITAMIN C PO Take 1 tablet by mouth every Monday, Wednesday, and Friday.               Durable Medical Equipment  (From admission, onward)           Start     Ordered   11/03/22 0736  DME Oxygen  Once  Comments: Pre Mobility:  HR 90, BP 113/86,  SpO2 80-85% 2.5L During Mobility: HR 103, SpO2 87% 2.5L Post Mobility: HR 96,  SpO2 90% 2.5L  Question Answer Comment  Length of Need 6 Months   Mode or (Route) Nasal cannula   Liters per Minute 2.5   Frequency Continuous (stationary and portable oxygen unit needed)   Oxygen conserving device No   Oxygen delivery system Gas      11/03/22 0739              Discharge Care  Instructions  (From admission, onward)           Start     Ordered   11/03/22 0000  Discharge wound care:       Comments: Keep fistula access site clean and dry   11/03/22 0739             Instructions: [facility] Discharge Instructions AV Fistula or Graft Surgery for Dialysis Access  Please refer to the following instructions for your post-procedure care. Your surgeon or physician assistant will discuss any changes with you.  Activity  You may drive the day following your surgery, if you are comfortable and no longer taking prescription pain medication. Resume full activity as the soreness in your incision resolves.  Bathing/Showering  You may shower after you go home. Keep your incision dry for 48 hours. Do not soak in a bathtub, hot tub, or swim until the incision heals completely. You may not shower if you have a hemodialysis catheter.  Incision Care  Clean your incision with mild soap and water after 48 hours. Pat the area dry with a clean towel. You do not need a bandage unless otherwise instructed. Do not apply any ointments or creams to your incision. You may have skin glue on your incision. Do not peel it off. It will come off on its own in about one week. Your arm may swell a bit after surgery. To reduce swelling use pillows to elevate your arm so it is above your heart. Your doctor will tell you if you need to lightly wrap your arm with an ACE bandage.  Diet  Resume your normal diet. There are not special food restrictions following this procedure. In order to heal from your surgery, it is CRITICAL to get adequate nutrition. Your body requires vitamins, minerals, and protein. Vegetables are the best source of vitamins and minerals. Vegetables also provide the perfect balance of protein. Processed food has little nutritional value, so try to avoid this.  Medications  Resume taking all of your medications. If your incision is  causing pain, you may take over-the counter pain relievers such as acetaminophen (Tylenol). If you were prescribed a stronger pain medication, please be aware these medications can cause nausea and constipation. Prevent nausea by taking the medication with a snack or meal. Avoid constipation by drinking plenty of fluids and eating foods with high amount of fiber, such as fruits, vegetables, and grains. Do not take Tylenol if you are taking prescription pain medications.  Follow up Your surgeon may want to see you in the office following your access surgery. If so, this will be arranged at the time of your surgery.  Please call us immediately for any of the following conditions:  Increased pain, redness, drainage (pus) from your incision site Fever of 101 degrees or higher Severe or worsening pain at your incision site Hand pain or numbness.  Reduce your risk of vascular  disease:  Stop smoking. If you would like help, call [facility] at 1-800-QUIT-NOW 442 543 5787) or [facility] [place] your cholesterol Maintain a desired weight Control your diabetes Keep your blood pressure down  Dialysis  It will take several weeks to several months for your new dialysis access to be ready for use. Your surgeon will determine when it is OK to use it. Your nephrologist will continue to direct your dialysis. You can continue to use your Permcath until your new access is ready for use.   11/03/2022 [name] [id number] 17-Apr-1961  Surgeon(s): Waynetta Sandy, MD  Procedure(s): A/V Fistulagram A/V SHUNT INTERVENTION    If you have any questions, please call the office at [phone].  Prescriptions given: none  Disposition: home  Patient's condition: is Good  Follow up: 1. VVS as needed   Leontine Locket, PA-C [facility] [phone] 11/03/2022  8:29 AM

## 2022-11-01 NOTE — Progress Notes (Signed)
Renal Quick Note:  Stopped in to see Mr. Traywick - s/p f'gram yesterday and transportation issues prevented him from getting home last night. He missed his HD yesterday.  Discussed option of trying to dialyze him here, but it would likely be overnight tonight d/t staffing and high HD census. He reports that his wife is picking him up this afternoon and that he would rather just go to outpatient HD tomorrow as usual.  Labs reviewed - BUN very high, but K/CO2 ok. He feels well. No confusion or dyspnea.  Plan sounds reasonable - d/c this afternoon and outpatient HD tomrorow at his usual clinic/time.  Veneta Penton, PA-C [employer] Pager [phone]

## 2022-11-02 LAB — BASIC METABOLIC PANEL
Anion gap: 18 — ABNORMAL HIGH (ref 5–15)
BUN: 136 mg/dL — ABNORMAL HIGH (ref 8–23)
CO2: 31 mmol/L (ref 22–32)
Calcium: 8 mg/dL — ABNORMAL LOW (ref 8.9–10.3)
Chloride: 83 mmol/L — ABNORMAL LOW (ref 98–111)
Creatinine, Ser: 7.3 mg/dL — ABNORMAL HIGH (ref 0.61–1.24)
GFR, Estimated: 8 mL/min — ABNORMAL LOW (ref >60)
Glucose, Bld: 78 mg/dL (ref 70–99)
Potassium: 3.3 mmol/L — ABNORMAL LOW (ref 3.5–5.1)
Sodium: 132 mmol/L — ABNORMAL LOW (ref 135–145)

## 2022-11-02 LAB — GLUCOSE, CAPILLARY
Glucose-Capillary: 79 mg/dL (ref 70–99)
Glucose-Capillary: 101 mg/dL — ABNORMAL HIGH (ref 70–99)
Glucose-Capillary: 62 mg/dL — ABNORMAL LOW (ref 70–99)
Glucose-Capillary: 77 mg/dL (ref 70–99)

## 2022-11-02 LAB — HEPATITIS B SURFACE ANTIGEN: Hepatitis B Surface Ag: NONREACTIVE

## 2022-11-02 LAB — CBC
HCT: 35.2 % — ABNORMAL LOW (ref 39.0–52.0)
Hemoglobin: 11.6 g/dL — ABNORMAL LOW (ref 13.0–17.0)
MCH: 28.7 pg (ref 26.0–34.0)
MCHC: 33 g/dL (ref 30.0–36.0)
MCV: 87.1 fL (ref 80.0–100.0)
Platelets: 149 10*3/uL — ABNORMAL LOW (ref 150–400)
RBC: 4.04 MIL/uL — ABNORMAL LOW (ref 4.22–5.81)
RDW: 14.7 % (ref 11.5–15.5)
WBC: 7.5 10*3/uL (ref 4.0–10.5)
nRBC: 0 % (ref 0.0–0.2)

## 2022-11-02 MED ORDER — HEPARIN SODIUM (PORCINE) 1000 UNIT/ML DIALYSIS: 20.0000 [IU]/kg | INTRAMUSCULAR | Status: DC | PRN

## 2022-11-02 MED ORDER — CLOPIDOGREL BISULFATE 75 MG PO TABS: 75.0000 mg | ORAL_TABLET | Freq: Every day | ORAL | 11 refills | Status: DC

## 2022-11-02 MED ORDER — CHLORHEXIDINE GLUCONATE CLOTH 2 % EX PADS
6.0000 | MEDICATED_PAD | Freq: Every day | CUTANEOUS | Status: DC
  Administered 2022-11-02 – 2022-11-03 (×2): 6 via TOPICAL

## 2022-11-02 MED FILL — Heparin Sodium (Porcine) Inj 1000 Unit/ML: INTRAMUSCULAR | Qty: 10 | Status: AC

## 2022-11-02 NOTE — Progress Notes (Addendum)
  Progress Note    11/02/2022 6:50 AM 2 Days Post-Op  Subjective:  c/o right neck and shoulder pain.  Says he is having trouble swallowing.  Afebrile HR 70's-80's NSR 751'Z-001'V systolic 494% [id number]   Vitals:   11/01/22 2320 11/02/22 0355  BP: 115/72 124/73  Pulse: 77 82  Resp: 14 14  Temp: 97.7 F (36.5 C) 97.7 F (36.5 C)  SpO2: 94% 94%    Physical Exam: General:  no distress Cardiac:  regular Lungs:  non labored Extremities:  palpable right radial pulse; +thrill in right arm graft; mild fullness right neck/chest and tender to palpation  CBC    Component Value Date/Time   WBC 5.6 11/01/2022 0220   RBC 3.74 (L) 11/01/2022 0220   HGB 10.7 (L) 11/01/2022 0220   HCT 32.4 (L) 11/01/2022 0220   PLT 114 (L) 11/01/2022 0220   MCV 86.6 11/01/2022 0220   MCH 28.6 11/01/2022 0220   MCHC 33.0 11/01/2022 0220   RDW 14.7 11/01/2022 0220   LYMPHSABS 0.9 10/19/2022 2253   MONOABS 0.7 10/19/2022 2253   EOSABS 0.1 10/19/2022 2253   BASOSABS 0.0 10/19/2022 2253    BMET    Component Value Date/Time   NA 133 (L) 11/01/2022 0220   K 2.9 (L) 11/01/2022 0220   CL 86 (L) 11/01/2022 0220   CO2 30 11/01/2022 0220   GLUCOSE 371 (H) 11/01/2022 0220   BUN 132 (H) 11/01/2022 0220   CREATININE 6.37 (H) 11/01/2022 0220   CALCIUM 7.8 (L) 11/01/2022 0220   GFRNONAA 9 (L) 11/01/2022 0220    INR    Component Value Date/Time   INR 1.1 10/19/2022 2253     Intake/Output Summary (Last 24 hours) at 11/02/2022 0650 Last data filed at 11/02/2022 1638 Gross per 24 hour  Intake 250 ml  Output 3550 ml  Net -3300 ml      Assessment/Plan:  61 y.o. male is s/p:  Shuntogram with drug coated balloon angioplasty right innominate vein stent    2 Days Post-Op   -pt with right neck pain and fullness with tenderness to palpation.  O2 sats upper 90's-100% on 1LO2NC.  Will get stat labs to check hgb and will d/w Dr. Donzetta Matters -DVT prophylaxis:  sq heparin   Leontine Locket, PA-C [facility] [phone] 11/02/2022 6:50 AM   I have independently interviewed and examined patient and agree with PA assessment and plan above.  Graft with very strong thrill and CBC is stable.  He will need dialysis and then should be okay for discharge home.  Patient may ultimately require conversion to right upper arm hero graft given issues with right innominate vein stenting.  Daizha Anand C. Donzetta Matters, MD [facility] [place] Office: [phone] Pager: [phone]

## 2022-11-02 NOTE — Progress Notes (Signed)
Mobility Specialist Progress Note   11/02/22 1037  Mobility  Activity Dangled on edge of bed;Stood at bedside  Level of Assistance Standby assist, set-up cues, supervision of patient - no hands on  Assistive Device Front wheel walker  Range of Motion/Exercises Active;All extremities  Activity Response Tolerated fair;RN notified   Pre Mobility:  HR 90, BP 113/86,  SpO2 80-85% 2.5L During Mobility: HR 103, SpO2 87% 2.5L Post Mobility: HR 96,  SpO2 90% 2.5L  Patient received in supine asleep and easy to arouse. Presented with lethargy and struggling to keep eyes open. With verbal and light stimulation patient managed to stay awake. Very eager to participate despite complaints of pain (unspecified), nausea and fatigue. Was independent with bed mobility and stood with supervision, refused any physical assistance. Upon returning EOB patient dry heaved for approximately 5 minutes and then returned to supine independently. Subsequently, patient did eventually vomit a large amount of emesis in bath basin after returning to supine. RN notified. Of note, his oxygen saturation maintained low-mid 80's on 2.5L requiring cues for pursed lip breathing, in which his saturations did improve to 90%.  Was left in supine with all needs met, call bell in reach.   Jeffrey [name], BS EXP Mobility Specialist Please contact via SecureChat or Rehab office at [phone]

## 2022-11-02 NOTE — Progress Notes (Signed)
Pt receives out-pt HD at [facility] on MWF. Pt arrives at 11:10 for 11:30 chair time. Will assist as needed.   Melven Sartorius Renal Navigator [phone]

## 2022-11-02 NOTE — Progress Notes (Signed)
Renal Quick Note:  Informed that Jeffrey [name] developed neck and arm pain and ended up staying the night for evaluation. Definitely needs a dialysis today, BUN way up -> will just plan to dialyze him here, ordered placed - 4K bath to correct hypokalemia. Remains OBS status.  Jeffrey Penton, PA-C [employer] Pager [phone]

## 2022-11-02 NOTE — Progress Notes (Signed)
Pt's CBG 62.  Patient has been very sleepy, not eating and reports not being hungry today.  Patient alert and oriented x4.  Patient drank an entire cup of OJ.  Will recheck CBG in one hour.

## 2022-11-03 DIAGNOSIS — E1165 Type 2 diabetes mellitus with hyperglycemia: Secondary | ICD-10-CM | POA: Diagnosis not present

## 2022-11-03 DIAGNOSIS — Z794 Long term (current) use of insulin: Secondary | ICD-10-CM | POA: Diagnosis not present

## 2022-11-03 DIAGNOSIS — I120 Hypertensive chronic kidney disease with stage 5 chronic kidney disease or end stage renal disease: Secondary | ICD-10-CM | POA: Diagnosis present

## 2022-11-03 DIAGNOSIS — Z6841 Body Mass Index (BMI) 40.0 and over, adult: Secondary | ICD-10-CM | POA: Diagnosis not present

## 2022-11-03 DIAGNOSIS — F4310 Post-traumatic stress disorder, unspecified: Secondary | ICD-10-CM | POA: Diagnosis present

## 2022-11-03 DIAGNOSIS — Z7982 Long term (current) use of aspirin: Secondary | ICD-10-CM | POA: Diagnosis not present

## 2022-11-03 DIAGNOSIS — T82858A Stenosis of vascular prosthetic devices, implants and grafts, initial encounter: Secondary | ICD-10-CM | POA: Diagnosis present

## 2022-11-03 DIAGNOSIS — Z87891 Personal history of nicotine dependence: Secondary | ICD-10-CM | POA: Diagnosis not present

## 2022-11-03 DIAGNOSIS — Z992 Dependence on renal dialysis: Secondary | ICD-10-CM | POA: Diagnosis not present

## 2022-11-03 DIAGNOSIS — Y828 Other medical devices associated with adverse incidents: Secondary | ICD-10-CM | POA: Diagnosis present

## 2022-11-03 DIAGNOSIS — E876 Hypokalemia: Secondary | ICD-10-CM | POA: Diagnosis present

## 2022-11-03 DIAGNOSIS — M1990 Unspecified osteoarthritis, unspecified site: Secondary | ICD-10-CM | POA: Diagnosis present

## 2022-11-03 DIAGNOSIS — N186 End stage renal disease: Secondary | ICD-10-CM | POA: Diagnosis present

## 2022-11-03 DIAGNOSIS — E1122 Type 2 diabetes mellitus with diabetic chronic kidney disease: Secondary | ICD-10-CM | POA: Diagnosis present

## 2022-11-03 DIAGNOSIS — Z888 Allergy status to other drugs, medicaments and biological substances status: Secondary | ICD-10-CM | POA: Diagnosis not present

## 2022-11-03 DIAGNOSIS — J449 Chronic obstructive pulmonary disease, unspecified: Secondary | ICD-10-CM | POA: Diagnosis present

## 2022-11-03 DIAGNOSIS — Z96651 Presence of right artificial knee joint: Secondary | ICD-10-CM | POA: Diagnosis present

## 2022-11-03 DIAGNOSIS — Z79899 Other long term (current) drug therapy: Secondary | ICD-10-CM | POA: Diagnosis not present

## 2022-11-03 DIAGNOSIS — Z91018 Allergy to other foods: Secondary | ICD-10-CM | POA: Diagnosis not present

## 2022-11-03 DIAGNOSIS — Z89612 Acquired absence of left leg above knee: Secondary | ICD-10-CM | POA: Diagnosis not present

## 2022-11-03 LAB — GLUCOSE, CAPILLARY
Glucose-Capillary: 177 mg/dL — ABNORMAL HIGH (ref 70–99)
Glucose-Capillary: 282 mg/dL — ABNORMAL HIGH (ref 70–99)
Glucose-Capillary: 58 mg/dL — ABNORMAL LOW (ref 70–99)
Glucose-Capillary: 99 mg/dL (ref 70–99)

## 2022-11-03 LAB — HEPATITIS B SURFACE ANTIBODY, QUANTITATIVE: Hep B S AB Quant (Post): 23.1 m[IU]/mL (ref >9.9)

## 2022-11-03 NOTE — Progress Notes (Signed)
Patient off the floor for dialysis. CCMD notified.

## 2022-11-03 NOTE — Progress Notes (Signed)
Renal Quick Note:  Dialyzed overnight - feels ok this AM. To be discharged today and resume HD at his outpatient unit tomorrow.   No changes to HD orders.  Veneta Penton, PA-C [employer] Pager [phone]

## 2022-11-03 NOTE — TOC Transition Note (Signed)
Transition of Care (TOC) - CM/SW Discharge Note [name] RN, BSN Transitions of Care Unit 4E- RN Case Manager See Treatment Team for direct phone #   Patient Details  Name: Jeffrey [name] MRN: [id number] Date of Birth: 25-Aug-1961  Transition of Care [facility]) CM/SW Contact:  Dawayne Patricia, RN Phone Number: 11/03/2022, 10:20 AM   Clinical Narrative:    Pt stable for transition home today. Order placed for home 02 need.  CM spoke with pt at bedside- choice offered for DME agency- per pt he does not have a preference- agreeable to use any local agency- requesting one that can provide portable unit that will work with his electric wheelchair.   Pt reports that his ride will be here around 12noon.  Address, phone # and PCP- pt confirmed he will be establishing with new provider at [facility]- can not remember date but states it's this month.   Call made to [facility] w/ [employer] for Home 02 referral- referral accepted and [employer] will deliver portable unit prior to discharge.   No further TOC needs noted.    Final next level of care: Home/Self Care Barriers to Discharge: No Barriers Identified   Patient Goals and CMS Choice Patient states their goals for this hospitalization and ongoing recovery are:: return home CMS Medicare.gov Compare Post Acute Care list provided to:: Patient Choice offered to / list presented to : Patient  Discharge Placement               Home        Discharge Plan and Services   Discharge Planning Services: CM Consult Post Acute Care Choice: Durable Medical Equipment          DME Arranged: Oxygen DME Agency: [employer] Date DME Agency Contacted: 11/03/22 Time DME Agency Contacted: 37 Representative spoke with at DME Agency: Brenton Grills HH Arranged: NA [place] Agency: NA        Social Determinants of Health ([place]) Interventions     Readmission Risk Interventions    11/03/2022   10:19 AM 04/07/2021    3:59 PM   Readmission Risk Prevention Plan  Transportation Screening Complete Complete  Medication Review (RN Care Manager) Complete Referral to Pharmacy  PCP or Specialist appointment within 3-5 days of discharge  Complete  HRI or Home Care Consult Complete Complete  SW Recovery Care/Counseling Consult Complete Complete  Palliative Care Screening Not Applicable Not [place] Not Applicable Complete

## 2022-11-03 NOTE — Plan of Care (Signed)
  Problem: Education: Goal: Understanding of CV disease, CV risk reduction, and recovery process will improve Outcome: Progressing Goal: Individualized Educational Video(s) Outcome: Progressing   Problem: Activity: Goal: Ability to return to baseline activity level will improve Outcome: Progressing   Problem: Cardiovascular: Goal: Ability to achieve and maintain adequate cardiovascular perfusion will improve Outcome: Progressing Goal: Vascular access site(s) Level 0-1 will be maintained Outcome: Progressing   Problem: Health Behavior/Discharge Planning: Goal: Ability to safely manage health-related needs after discharge will improve Outcome: Progressing   Problem: Education: Goal: Ability to describe self-care measures that may prevent or decrease complications (Diabetes Survival Skills Education) will improve Outcome: Progressing Goal: Individualized Educational Video(s) Outcome: Progressing   Problem: Fluid Volume: Goal: Ability to maintain a balanced intake and output will improve Outcome: Progressing   Problem: Health Behavior/Discharge Planning: Goal: Ability to identify and utilize available resources and services will improve Outcome: Progressing Goal: Ability to manage health-related needs will improve Outcome: Progressing   Problem: Metabolic: Goal: Ability to maintain appropriate glucose levels will improve Outcome: Progressing   Problem: Nutritional: Goal: Maintenance of adequate nutrition will improve Outcome: Progressing Goal: Progress toward achieving an optimal weight will improve Outcome: Progressing   Problem: Skin Integrity: Goal: Risk for impaired skin integrity will decrease Outcome: Progressing   Problem: Tissue Perfusion: Goal: Adequacy of tissue perfusion will improve Outcome: Progressing   Problem: Education: Goal: Knowledge of General Education information will improve Description: Including pain rating scale, medication(s)/side effects  and non-pharmacologic comfort measures Outcome: Progressing   Problem: Health Behavior/Discharge Planning: Goal: Ability to manage health-related needs will improve Outcome: Progressing   Problem: Clinical Measurements: Goal: Ability to maintain clinical measurements within normal limits will improve Outcome: Progressing Goal: Will remain free from infection Outcome: Progressing Goal: Diagnostic test results will improve Outcome: Progressing Goal: Respiratory complications will improve Outcome: Progressing Goal: Cardiovascular complication will be avoided Outcome: Progressing   Problem: Activity: Goal: Risk for activity intolerance will decrease Outcome: Progressing   Problem: Nutrition: Goal: Adequate nutrition will be maintained Outcome: Progressing   Problem: Coping: Goal: Level of anxiety will decrease Outcome: Progressing   Problem: Elimination: Goal: Will not experience complications related to bowel motility Outcome: Progressing Goal: Will not experience complications related to urinary retention Outcome: Progressing   Problem: Pain Managment: Goal: General experience of comfort will improve Outcome: Progressing   Problem: Safety: Goal: Ability to remain free from injury will improve Outcome: Progressing   Problem: Skin Integrity: Goal: Risk for impaired skin integrity will decrease Outcome: Progressing

## 2022-11-03 NOTE — Progress Notes (Signed)
Pt was d/c to home today. Contacted [facility] to advise clinic of pt's d/c today and that pt will resume care tomorrow.   Melven Sartorius Renal Navigator [phone]

## 2022-11-03 NOTE — Progress Notes (Signed)
   11/03/22 0010  Vitals  Temp 98.7 F (37.1 C)  Temp Source Oral  BP 112/69  MAP (mmHg) 80  BP Location Left Arm  BP Method Automatic  Patient Position (if appropriate) Lying  Pulse Rate 90  Pulse Rate Source Monitor  ECG Heart Rate 93  Resp 11  Post Treatment  Dialyzer Clearance Lightly streaked  Duration of HD Treatment -hour(s) 3.5 hour(s)  Liters Processed 81.3  Fluid Removed (mL) 2500 mL  Tolerated HD Treatment Yes  AVG/AVF Arterial Site Held (minutes) 5 minutes  AVG/AVF Venous Site Held (minutes) 5 minutes   TYX fin w/o difficulty.

## 2022-11-03 NOTE — Progress Notes (Addendum)
  Progress Note    11/03/2022 7:40 AM 3 Days Post-Op  Subjective:  ready to go home   Vitals:   11/03/22 0046 11/03/22 0332  BP: (!) 144/96 124/68  Pulse: (!) 113 98  Resp: 20 11  Temp: 98.6 F (37 C) 98.6 F (37 C)  SpO2: 98% 95%    Physical Exam: Lungs:  nonlabored, sat 96% on 2.5L [place] Extremities:  RUE AVG with palpable thrill. Needle access sites dry and dressed with gauze  CBC    Component Value Date/Time   WBC 7.5 11/02/2022 0754   RBC 4.04 (L) 11/02/2022 0754   HGB 11.6 (L) 11/02/2022 0754   HCT 35.2 (L) 11/02/2022 0754   PLT 149 (L) 11/02/2022 0754   MCV 87.1 11/02/2022 0754   MCH 28.7 11/02/2022 0754   MCHC 33.0 11/02/2022 0754   RDW 14.7 11/02/2022 0754   LYMPHSABS 0.9 10/19/2022 2253   MONOABS 0.7 10/19/2022 2253   EOSABS 0.1 10/19/2022 2253   BASOSABS 0.0 10/19/2022 2253    BMET    Component Value Date/Time   NA 132 (L) 11/02/2022 0754   K 3.3 (L) 11/02/2022 0754   CL 83 (L) 11/02/2022 0754   CO2 31 11/02/2022 0754   GLUCOSE 78 11/02/2022 0754   BUN 136 (H) 11/02/2022 0754   CREATININE 7.30 (H) 11/02/2022 0754   CALCIUM 8.0 (L) 11/02/2022 0754   GFRNONAA 8 (L) 11/02/2022 0754    INR    Component Value Date/Time   INR 1.1 10/19/2022 2253     Intake/Output Summary (Last 24 hours) at 11/03/2022 0740 Last data filed at 11/03/2022 0400 Gross per 24 hour  Intake 240 ml  Output 3000 ml  Net -2760 ml      Assessment/Plan:  61 y.o. male is 3 days post op, s/p:  RUE AVG fistulogram with angioplasty and stenting of R innominate vein stent    -Right AVG with palpable thrill, access sites from dialysis yesterday is bandaged and dry -Palpable right radial pulse -AVG worked well at dialysis yesterday -Patient still requiring O2 at rest and during ambulation, not usually on O2 at home. O2 sats at home at 96% on 2.5L [place] -Will order home O2 -D/c home today. He can follow up with our office as needed if any graft issues arise   Vicente Serene, PA-C [facility] [phone] 11/03/2022 7:40 AM  I have independently interviewed and examined patient and agree with PA assessment and plan above.  Graft with strong thrill and work for dialysis yesterday and edema improved in right upper extremity.  Okay for DC home today and can follow-up as needed.  May ultimately need to consider alternative right upper extremity access including possible HeRO graft.  Mahki Spikes C. Donzetta Matters, MD [facility] [place] Office: [phone] Pager: [phone]

## 2022-11-03 NOTE — Progress Notes (Signed)
Pt's blood sugar ws 58 after coming back from dialysis. Pt did not get to eat dinner before going to hemo. Blood sugar after snack was 99. No s/s of distress. Plan of care continues.

## 2022-11-03 NOTE — Progress Notes (Signed)
Patient came back from dialysis, received report from HD RN. Pt Ao x4. Vitals stable, complained of right arm pain and neck pain 9/10. See MAR for Medication administration. Connected to tele, CCMD notified. Call bell within reach. Will continue to monitor.

## 2022-11-23 ENCOUNTER — Other Ambulatory Visit: Payer: Self-pay

## 2022-11-23 ENCOUNTER — Inpatient Hospital Stay (HOSPITAL_COMMUNITY)
Admission: EM | Admit: 2022-11-23 | Discharge: 2022-12-04 | DRG: 640 | Disposition: A | Discharge status: Home / Self Care | Payer: 59 | Attending: Internal Medicine | Admitting: Internal Medicine

## 2022-11-23 ENCOUNTER — Emergency Department (HOSPITAL_COMMUNITY): Payer: 59

## 2022-11-23 DIAGNOSIS — N186 End stage renal disease: Secondary | ICD-10-CM | POA: Diagnosis present

## 2022-11-23 DIAGNOSIS — I5032 Chronic diastolic (congestive) heart failure: Secondary | ICD-10-CM | POA: Diagnosis present

## 2022-11-23 DIAGNOSIS — Z91199 Patient's noncompliance with other medical treatment and regimen due to unspecified reason: Secondary | ICD-10-CM

## 2022-11-23 DIAGNOSIS — F39 Unspecified mood [affective] disorder: Secondary | ICD-10-CM | POA: Diagnosis present

## 2022-11-23 DIAGNOSIS — Z87891 Personal history of nicotine dependence: Secondary | ICD-10-CM

## 2022-11-23 DIAGNOSIS — Z9981 Dependence on supplemental oxygen: Secondary | ICD-10-CM

## 2022-11-23 DIAGNOSIS — E8770 Fluid overload, unspecified: Secondary | ICD-10-CM | POA: Diagnosis not present

## 2022-11-23 DIAGNOSIS — J9811 Atelectasis: Secondary | ICD-10-CM | POA: Diagnosis present

## 2022-11-23 DIAGNOSIS — Z6841 Body Mass Index (BMI) 40.0 and over, adult: Secondary | ICD-10-CM

## 2022-11-23 DIAGNOSIS — Z89512 Acquired absence of left leg below knee: Secondary | ICD-10-CM

## 2022-11-23 DIAGNOSIS — E1169 Type 2 diabetes mellitus with other specified complication: Secondary | ICD-10-CM | POA: Diagnosis present

## 2022-11-23 DIAGNOSIS — F4310 Post-traumatic stress disorder, unspecified: Secondary | ICD-10-CM | POA: Diagnosis present

## 2022-11-23 DIAGNOSIS — E1122 Type 2 diabetes mellitus with diabetic chronic kidney disease: Secondary | ICD-10-CM | POA: Diagnosis present

## 2022-11-23 DIAGNOSIS — E1142 Type 2 diabetes mellitus with diabetic polyneuropathy: Secondary | ICD-10-CM

## 2022-11-23 DIAGNOSIS — M545 Low back pain, unspecified: Secondary | ICD-10-CM | POA: Diagnosis present

## 2022-11-23 DIAGNOSIS — J449 Chronic obstructive pulmonary disease, unspecified: Secondary | ICD-10-CM | POA: Diagnosis present

## 2022-11-23 DIAGNOSIS — F419 Anxiety disorder, unspecified: Secondary | ICD-10-CM | POA: Diagnosis present

## 2022-11-23 DIAGNOSIS — I132 Hypertensive heart and chronic kidney disease with heart failure and with stage 5 chronic kidney disease, or end stage renal disease: Secondary | ICD-10-CM | POA: Diagnosis present

## 2022-11-23 DIAGNOSIS — N401 Enlarged prostate with lower urinary tract symptoms: Secondary | ICD-10-CM | POA: Diagnosis present

## 2022-11-23 DIAGNOSIS — I5033 Acute on chronic diastolic (congestive) heart failure: Secondary | ICD-10-CM | POA: Diagnosis present

## 2022-11-23 DIAGNOSIS — D509 Iron deficiency anemia, unspecified: Secondary | ICD-10-CM | POA: Diagnosis present

## 2022-11-23 DIAGNOSIS — F919 Conduct disorder, unspecified: Secondary | ICD-10-CM | POA: Diagnosis present

## 2022-11-23 DIAGNOSIS — Z794 Long term (current) use of insulin: Secondary | ICD-10-CM

## 2022-11-23 DIAGNOSIS — J9601 Acute respiratory failure with hypoxia: Principal | ICD-10-CM

## 2022-11-23 DIAGNOSIS — E1165 Type 2 diabetes mellitus with hyperglycemia: Secondary | ICD-10-CM | POA: Diagnosis present

## 2022-11-23 DIAGNOSIS — N19 Unspecified kidney failure: Secondary | ICD-10-CM | POA: Insufficient documentation

## 2022-11-23 DIAGNOSIS — G4733 Obstructive sleep apnea (adult) (pediatric): Secondary | ICD-10-CM

## 2022-11-23 DIAGNOSIS — Z1152 Encounter for screening for COVID-19: Secondary | ICD-10-CM

## 2022-11-23 DIAGNOSIS — Z89612 Acquired absence of left leg above knee: Secondary | ICD-10-CM

## 2022-11-23 DIAGNOSIS — Z79899 Other long term (current) drug therapy: Secondary | ICD-10-CM

## 2022-11-23 DIAGNOSIS — R739 Hyperglycemia, unspecified: Secondary | ICD-10-CM

## 2022-11-23 DIAGNOSIS — Z992 Dependence on renal dialysis: Secondary | ICD-10-CM

## 2022-11-23 DIAGNOSIS — G8929 Other chronic pain: Secondary | ICD-10-CM | POA: Diagnosis present

## 2022-11-23 DIAGNOSIS — F330 Major depressive disorder, recurrent, mild: Secondary | ICD-10-CM | POA: Diagnosis present

## 2022-11-23 DIAGNOSIS — I272 Pulmonary hypertension, unspecified: Secondary | ICD-10-CM | POA: Diagnosis present

## 2022-11-23 DIAGNOSIS — Z91158 Patient's noncompliance with renal dialysis for other reason: Secondary | ICD-10-CM

## 2022-11-23 DIAGNOSIS — D631 Anemia in chronic kidney disease: Secondary | ICD-10-CM | POA: Diagnosis present

## 2022-11-23 DIAGNOSIS — J9621 Acute and chronic respiratory failure with hypoxia: Secondary | ICD-10-CM | POA: Insufficient documentation

## 2022-11-23 DIAGNOSIS — E782 Mixed hyperlipidemia: Secondary | ICD-10-CM | POA: Diagnosis present

## 2022-11-23 DIAGNOSIS — R3912 Poor urinary stream: Secondary | ICD-10-CM | POA: Diagnosis present

## 2022-11-23 DIAGNOSIS — I959 Hypotension, unspecified: Secondary | ICD-10-CM | POA: Diagnosis not present

## 2022-11-23 HISTORY — DX: Sepsis, unspecified organism: A41.9

## 2022-11-23 HISTORY — DX: Dependence on renal dialysis: N18.6

## 2022-11-23 HISTORY — DX: End stage renal disease: Z99.2

## 2022-11-23 NOTE — ED Notes (Signed)
Triage RN notified of oxygen

## 2022-11-23 NOTE — ED Triage Notes (Signed)
Patient arrived with EMS from home reports worsening CP with SOB this evening , he missed his hemodialysis treatment for 1 week . He received ASA 324 mg by EMS /CBG= 592.

## 2022-11-23 NOTE — ED Provider Triage Note (Signed)
Emergency Medicine Provider Triage Evaluation Note  Jeffrey [name] , a 61 y.o. male  was evaluated in triage.  Pt complains of chest pain and shortness of breath as well as fluid overload.  Patient has missed 4 dialysis sessions due to being kicked out of his dialysis center.  He states his abdomen is swollen as well as his extremities.    Review of Systems  Positive: As above Negative: As above  Physical Exam  There were no vitals taken for this visit. Gen:   Awake, no distress   Resp:  Decreased breath sounds in bases, speaks in 2-3 word sentences  MSK:   Moves extremities without difficulty, edema to bilateral upper extremities and RLE  Other:  Abdomen distended  Medical Decision Making  Medically screening exam initiated at 7:50 PM.  Appropriate orders placed.  Erin Uecker was informed that the remainder of the evaluation will be completed by another provider, this initial triage assessment does not replace that evaluation, and the importance of remaining in the ED until their evaluation is complete.     Theressa Stamps R, [place] 11/23/22 [phone]

## 2022-11-24 ENCOUNTER — Encounter (HOSPITAL_COMMUNITY): Payer: Self-pay | Admitting: Internal Medicine

## 2022-11-24 DIAGNOSIS — N401 Enlarged prostate with lower urinary tract symptoms: Secondary | ICD-10-CM

## 2022-11-24 DIAGNOSIS — R3912 Poor urinary stream: Secondary | ICD-10-CM

## 2022-11-24 DIAGNOSIS — M545 Low back pain, unspecified: Secondary | ICD-10-CM

## 2022-11-24 DIAGNOSIS — F330 Major depressive disorder, recurrent, mild: Secondary | ICD-10-CM

## 2022-11-24 DIAGNOSIS — N19 Unspecified kidney failure: Secondary | ICD-10-CM | POA: Insufficient documentation

## 2022-11-24 DIAGNOSIS — J9621 Acute and chronic respiratory failure with hypoxia: Secondary | ICD-10-CM | POA: Diagnosis not present

## 2022-11-24 DIAGNOSIS — E8779 Other fluid overload: Secondary | ICD-10-CM

## 2022-11-24 DIAGNOSIS — F39 Unspecified mood [affective] disorder: Secondary | ICD-10-CM

## 2022-11-24 DIAGNOSIS — E1169 Type 2 diabetes mellitus with other specified complication: Secondary | ICD-10-CM

## 2022-11-24 DIAGNOSIS — I5032 Chronic diastolic (congestive) heart failure: Secondary | ICD-10-CM

## 2022-11-24 DIAGNOSIS — G4733 Obstructive sleep apnea (adult) (pediatric): Secondary | ICD-10-CM

## 2022-11-24 DIAGNOSIS — E785 Hyperlipidemia, unspecified: Secondary | ICD-10-CM

## 2022-11-24 DIAGNOSIS — J449 Chronic obstructive pulmonary disease, unspecified: Secondary | ICD-10-CM

## 2022-11-24 DIAGNOSIS — F4310 Post-traumatic stress disorder, unspecified: Secondary | ICD-10-CM

## 2022-11-24 DIAGNOSIS — D509 Iron deficiency anemia, unspecified: Secondary | ICD-10-CM

## 2022-11-24 DIAGNOSIS — E782 Mixed hyperlipidemia: Secondary | ICD-10-CM

## 2022-11-24 DIAGNOSIS — Z6841 Body Mass Index (BMI) 40.0 and over, adult: Secondary | ICD-10-CM

## 2022-11-24 DIAGNOSIS — G8929 Other chronic pain: Secondary | ICD-10-CM

## 2022-11-24 DIAGNOSIS — Z89512 Acquired absence of left leg below knee: Secondary | ICD-10-CM

## 2022-11-24 DIAGNOSIS — N186 End stage renal disease: Secondary | ICD-10-CM

## 2022-11-24 LAB — CBC WITH DIFFERENTIAL/PLATELET
Abs Immature Granulocytes: 0.08 10*3/uL — ABNORMAL HIGH (ref 0.00–0.07)
Basophils Absolute: 0 10*3/uL (ref 0.0–0.1)
Basophils Relative: 0 %
Eosinophils Absolute: 0.1 10*3/uL (ref 0.0–0.5)
Eosinophils Relative: 2 %
HCT: 30.5 % — ABNORMAL LOW (ref 39.0–52.0)
Hemoglobin: 9.6 g/dL — ABNORMAL LOW (ref 13.0–17.0)
Immature Granulocytes: 1 %
Lymphocytes Relative: 5 %
Lymphs Abs: 0.3 10*3/uL — ABNORMAL LOW (ref 0.7–4.0)
MCH: 28.7 pg (ref 26.0–34.0)
MCHC: 31.5 g/dL (ref 30.0–36.0)
MCV: 91 fL (ref 80.0–100.0)
Monocytes Absolute: 0.5 10*3/uL (ref 0.1–1.0)
Monocytes Relative: 6 %
Neutro Abs: 6.3 10*3/uL (ref 1.7–7.7)
Neutrophils Relative %: 86 %
Platelets: 120 10*3/uL — ABNORMAL LOW (ref 150–400)
RBC: 3.35 MIL/uL — ABNORMAL LOW (ref 4.22–5.81)
RDW: 14.6 % (ref 11.5–15.5)
WBC: 7.3 10*3/uL (ref 4.0–10.5)
nRBC: 0 % (ref 0.0–0.2)

## 2022-11-24 LAB — GLUCOSE, CAPILLARY: Glucose-Capillary: 411 mg/dL — ABNORMAL HIGH (ref 70–99)

## 2022-11-24 LAB — I-STAT CHEM 8, ED
BUN: >130 mg/dL — ABNORMAL HIGH (ref 8–23)
Calcium, Ion: 0.86 mmol/L — CL (ref 1.15–1.40)
Chloride: 89 mmol/L — ABNORMAL LOW (ref 98–111)
Creatinine, Ser: 5.5 mg/dL — ABNORMAL HIGH (ref 0.61–1.24)
Glucose, Bld: 675 mg/dL (ref 70–99)
HCT: 30 % — ABNORMAL LOW (ref 39.0–52.0)
Hemoglobin: 10.2 g/dL — ABNORMAL LOW (ref 13.0–17.0)
Potassium: 4.2 mmol/L (ref 3.5–5.1)
Sodium: 130 mmol/L — ABNORMAL LOW (ref 135–145)
TCO2: 27 mmol/L (ref 22–32)

## 2022-11-24 LAB — COMPREHENSIVE METABOLIC PANEL
ALT: 15 U/L (ref 0–44)
AST: 11 U/L — ABNORMAL LOW (ref 15–41)
Albumin: 3.1 g/dL — ABNORMAL LOW (ref 3.5–5.0)
Alkaline Phosphatase: 68 U/L (ref 38–126)
Anion gap: 15 (ref 5–15)
BUN: 119 mg/dL — ABNORMAL HIGH (ref 8–23)
CO2: 25 mmol/L (ref 22–32)
Calcium: 7 mg/dL — ABNORMAL LOW (ref 8.9–10.3)
Chloride: 90 mmol/L — ABNORMAL LOW (ref 98–111)
Creatinine, Ser: 5.24 mg/dL — ABNORMAL HIGH (ref 0.61–1.24)
GFR, Estimated: 12 mL/min — ABNORMAL LOW (ref >60)
Glucose, Bld: 667 mg/dL (ref 70–99)
Potassium: 4.2 mmol/L (ref 3.5–5.1)
Sodium: 130 mmol/L — ABNORMAL LOW (ref 135–145)
Total Bilirubin: 0.5 mg/dL (ref 0.3–1.2)
Total Protein: 7 g/dL (ref 6.5–8.1)

## 2022-11-24 LAB — RESP PANEL BY RT-PCR (RSV, FLU A&B, COVID)  RVPGX2
Influenza A by PCR: NEGATIVE
Influenza B by PCR: NEGATIVE
Resp Syncytial Virus by PCR: NEGATIVE
SARS Coronavirus 2 by RT PCR: NEGATIVE

## 2022-11-24 LAB — CBG MONITORING, ED
Glucose-Capillary: >600 mg/dL (ref 70–99)
Glucose-Capillary: 430 mg/dL — ABNORMAL HIGH (ref 70–99)

## 2022-11-24 LAB — LACTIC ACID, PLASMA: Lactic Acid, Venous: 1.1 mmol/L (ref 0.5–1.9)

## 2022-11-24 MED ORDER — TAMSULOSIN HCL 0.4 MG PO CAPS
0.4000 mg | ORAL_CAPSULE | Freq: Every day | ORAL | Status: DC
  Administered 2022-11-27 – 2022-12-04 (×8): 0.4 mg via ORAL
  Filled 2022-11-24 (×10): qty 1

## 2022-11-24 MED ORDER — INSULIN ASPART 100 UNIT/ML IJ SOLN
0.0000 [IU] | Freq: Three times a day (TID) | INTRAMUSCULAR | Status: DC
  Administered 2022-11-25: 20 [IU] via SUBCUTANEOUS
  Administered 2022-11-25: 11 [IU] via SUBCUTANEOUS
  Administered 2022-11-27 (×2): 3 [IU] via SUBCUTANEOUS
  Administered 2022-11-27 – 2022-11-28 (×2): 4 [IU] via SUBCUTANEOUS
  Administered 2022-11-28 – 2022-11-29 (×2): 7 [IU] via SUBCUTANEOUS
  Administered 2022-11-29: 4 [IU] via SUBCUTANEOUS
  Administered 2022-11-30 (×2): 20 [IU] via SUBCUTANEOUS
  Administered 2022-11-30: 7 [IU] via SUBCUTANEOUS
  Administered 2022-12-01: 11 [IU] via SUBCUTANEOUS
  Administered 2022-12-01: 20 [IU] via SUBCUTANEOUS
  Administered 2022-12-02: 11 [IU] via SUBCUTANEOUS
  Administered 2022-12-02: 4 [IU] via SUBCUTANEOUS
  Administered 2022-12-02: 11 [IU] via SUBCUTANEOUS
  Administered 2022-12-03 – 2022-12-04 (×2): 20 [IU] via SUBCUTANEOUS

## 2022-11-24 MED ORDER — SODIUM CHLORIDE 0.9 % IV SOLN
1.0000 g | Freq: Once | INTRAVENOUS | Status: AC
  Administered 2022-11-24: 1 g via INTRAVENOUS
  Filled 2022-11-24: qty 10

## 2022-11-24 MED ORDER — CHLORHEXIDINE GLUCONATE CLOTH 2 % EX PADS
6.0000 | MEDICATED_PAD | Freq: Every day | CUTANEOUS | Status: DC
  Administered 2022-11-25 – 2022-11-27 (×2): 6 via TOPICAL

## 2022-11-24 MED ORDER — TRAZODONE HCL 50 MG PO TABS
150.0000 mg | ORAL_TABLET | Freq: Every evening | ORAL | Status: DC | PRN
  Administered 2022-11-30 – 2022-12-03 (×4): 150 mg via ORAL
  Filled 2022-11-24 (×6): qty 3

## 2022-11-24 MED ORDER — ATORVASTATIN CALCIUM 40 MG PO TABS
40.0000 mg | ORAL_TABLET | Freq: Every morning | ORAL | Status: DC
  Administered 2022-11-27 – 2022-12-04 (×8): 40 mg via ORAL
  Filled 2022-11-24 (×10): qty 1

## 2022-11-24 MED ORDER — SODIUM CHLORIDE 0.9 % IV SOLN
500.0000 mg | Freq: Once | INTRAVENOUS | Status: AC
  Administered 2022-11-24: 500 mg via INTRAVENOUS
  Filled 2022-11-24: qty 5

## 2022-11-24 MED ORDER — PREGABALIN 25 MG PO CAPS
75.0000 mg | ORAL_CAPSULE | Freq: Every morning | ORAL | Status: DC
  Administered 2022-11-27 – 2022-12-04 (×8): 75 mg via ORAL
  Filled 2022-11-24: qty 3
  Filled 2022-11-24: qty 1
  Filled 2022-11-24 (×8): qty 3

## 2022-11-24 MED ORDER — INSULIN ASPART 100 UNIT/ML IJ SOLN
6.0000 [IU] | Freq: Once | INTRAMUSCULAR | Status: AC
  Administered 2022-11-24: 6 [IU] via SUBCUTANEOUS

## 2022-11-24 MED ORDER — TORSEMIDE 20 MG PO TABS
100.0000 mg | ORAL_TABLET | Freq: Two times a day (BID) | ORAL | Status: DC
  Administered 2022-11-24 – 2022-12-04 (×17): 100 mg via ORAL
  Filled 2022-11-24 (×19): qty 5

## 2022-11-24 MED ORDER — SODIUM CHLORIDE 0.9% FLUSH
3.0000 mL | Freq: Two times a day (BID) | INTRAVENOUS | Status: DC
  Administered 2022-11-24 – 2022-12-04 (×18): 3 mL via INTRAVENOUS

## 2022-11-24 MED ORDER — INSULIN GLARGINE-YFGN 100 UNIT/ML ~~LOC~~ SOLN
60.0000 [IU] | Freq: Two times a day (BID) | SUBCUTANEOUS | Status: DC
  Administered 2022-11-24 – 2022-11-30 (×10): 60 [IU] via SUBCUTANEOUS
  Filled 2022-11-24 (×13): qty 0.6

## 2022-11-24 MED ORDER — INSULIN ASPART 100 UNIT/ML IJ SOLN
10.0000 [IU] | Freq: Once | INTRAMUSCULAR | Status: AC
  Administered 2022-11-24: 10 [IU] via INTRAVENOUS

## 2022-11-24 MED ORDER — AMLODIPINE BESYLATE 10 MG PO TABS: 10.0000 mg | ORAL_TABLET | Freq: Every morning | ORAL | Status: DC

## 2022-11-24 MED ORDER — CLOPIDOGREL BISULFATE 75 MG PO TABS
75.0000 mg | ORAL_TABLET | Freq: Every day | ORAL | Status: DC
  Administered 2022-11-27 – 2022-12-04 (×8): 75 mg via ORAL
  Filled 2022-11-24 (×10): qty 1

## 2022-11-24 MED ORDER — INSULIN ASPART 100 UNIT/ML IJ SOLN
10.0000 [IU] | Freq: Once | INTRAMUSCULAR | Status: AC
  Administered 2022-11-30: 10 [IU] via INTRAVENOUS

## 2022-11-24 MED ORDER — ASPIRIN 81 MG PO TBEC
81.0000 mg | DELAYED_RELEASE_TABLET | Freq: Every morning | ORAL | Status: DC
  Administered 2022-11-27 – 2022-12-04 (×8): 81 mg via ORAL
  Filled 2022-11-24 (×10): qty 1

## 2022-11-24 MED ORDER — NORTRIPTYLINE HCL 10 MG PO CAPS
20.0000 mg | ORAL_CAPSULE | Freq: Every day | ORAL | Status: DC
  Administered 2022-11-24 – 2022-12-03 (×9): 20 mg via ORAL
  Filled 2022-11-24 (×12): qty 2

## 2022-11-24 MED ORDER — ONDANSETRON HCL 4 MG/2ML IJ SOLN
4.0000 mg | Freq: Once | INTRAMUSCULAR | Status: AC
  Administered 2022-11-24: 4 mg via INTRAVENOUS
  Filled 2022-11-24: qty 2

## 2022-11-24 MED ORDER — POLYETHYLENE GLYCOL 3350 17 G PO PACK: 17.0000 g | PACK | Freq: Every day | ORAL | Status: DC | PRN

## 2022-11-24 MED ORDER — ACETAMINOPHEN 650 MG RE SUPP: 650.0000 mg | Freq: Four times a day (QID) | RECTAL | Status: DC | PRN

## 2022-11-24 MED ORDER — METOLAZONE 5 MG PO TABS
20.0000 mg | ORAL_TABLET | Freq: Every day | ORAL | Status: DC
  Administered 2022-11-27 – 2022-11-30 (×4): 20 mg via ORAL
  Filled 2022-11-24 (×6): qty 4

## 2022-11-24 MED ORDER — HEPARIN SODIUM (PORCINE) 1000 UNIT/ML DIALYSIS
3000.0000 [IU] | INTRAMUSCULAR | Status: DC | PRN
  Filled 2022-11-24: qty 3

## 2022-11-24 MED ORDER — ACETAMINOPHEN 325 MG PO TABS
650.0000 mg | ORAL_TABLET | Freq: Four times a day (QID) | ORAL | Status: DC | PRN
  Administered 2022-11-27 – 2022-11-30 (×5): 650 mg via ORAL
  Filled 2022-11-24 (×8): qty 2

## 2022-11-24 MED ORDER — CALCIUM ACETATE (PHOS BINDER) 667 MG PO CAPS
1334.0000 mg | ORAL_CAPSULE | Freq: Two times a day (BID) | ORAL | Status: DC
  Administered 2022-11-24 – 2022-12-04 (×16): 1334 mg via ORAL
  Filled 2022-11-24 (×17): qty 2

## 2022-11-24 MED ORDER — INSULIN ASPART 100 UNIT/ML IJ SOLN
0.0000 [IU] | Freq: Every day | INTRAMUSCULAR | Status: DC
  Administered 2022-11-26 – 2022-11-28 (×3): 2 [IU] via SUBCUTANEOUS
  Administered 2022-11-29: 3 [IU] via SUBCUTANEOUS

## 2022-11-24 MED ORDER — MORPHINE SULFATE (PF) 2 MG/ML IV SOLN
1.0000 mg | Freq: Once | INTRAVENOUS | Status: AC
  Administered 2022-11-24: 1 mg via INTRAVENOUS
  Filled 2022-11-24: qty 1

## 2022-11-24 MED ORDER — FENTANYL CITRATE PF 50 MCG/ML IJ SOSY
50.0000 ug | PREFILLED_SYRINGE | Freq: Once | INTRAMUSCULAR | Status: AC
  Administered 2022-11-24: 50 ug via INTRAVENOUS
  Filled 2022-11-24: qty 1

## 2022-11-24 MED ORDER — DIAZEPAM 5 MG PO TABS
5.0000 mg | ORAL_TABLET | Freq: Two times a day (BID) | ORAL | Status: DC | PRN
  Administered 2022-11-27 – 2022-11-30 (×5): 5 mg via ORAL
  Filled 2022-11-24 (×5): qty 1

## 2022-11-24 MED ORDER — HEPARIN SODIUM (PORCINE) 5000 UNIT/ML IJ SOLN
5000.0000 [IU] | Freq: Three times a day (TID) | INTRAMUSCULAR | Status: DC
  Administered 2022-11-24 – 2022-12-04 (×29): 5000 [IU] via SUBCUTANEOUS
  Filled 2022-11-24 (×29): qty 1

## 2022-11-24 MED ORDER — CARVEDILOL 12.5 MG PO TABS
12.5000 mg | ORAL_TABLET | Freq: Two times a day (BID) | ORAL | Status: DC
  Administered 2022-11-24: 12.5 mg via ORAL
  Filled 2022-11-24 (×2): qty 1

## 2022-11-24 MED ORDER — ALBUTEROL SULFATE (2.5 MG/3ML) 0.083% IN NEBU
2.5000 mg | INHALATION_SOLUTION | Freq: Four times a day (QID) | RESPIRATORY_TRACT | Status: DC | PRN
  Administered 2022-11-25: 2.5 mg via RESPIRATORY_TRACT
  Filled 2022-11-24: qty 3

## 2022-11-24 MED ORDER — SERTRALINE HCL 100 MG PO TABS
100.0000 mg | ORAL_TABLET | Freq: Every morning | ORAL | Status: DC
  Administered 2022-11-27 – 2022-12-04 (×8): 100 mg via ORAL
  Filled 2022-11-24 (×10): qty 1

## 2022-11-24 MED ORDER — PANTOPRAZOLE SODIUM 40 MG PO TBEC
40.0000 mg | DELAYED_RELEASE_TABLET | Freq: Every day | ORAL | Status: DC
  Administered 2022-11-27 – 2022-12-04 (×8): 40 mg via ORAL
  Filled 2022-11-24 (×10): qty 1

## 2022-11-24 NOTE — ED Notes (Signed)
Pt in room A&O x4. Experiencing SOB. Gasping for air every couple words. Pt updated on plan of care. Awaiting IV team for IV for blood draw.

## 2022-11-24 NOTE — ED Notes (Signed)
Report given to Hemodialysis Nurse

## 2022-11-24 NOTE — H&P (Signed)
History and Physical   Jeffrey [name] JHE:[id number] DOB: [birth year] DOA: 11/23/2022  PCP: Kristie Cowman, MD   Patient coming from: Home  Chief Complaint: Multiple complaints: Shortness of breath, fatigue, nausea, body aches  HPI: Jeffrey [name] is a 61 y.o. male with medical history significant of ESRD, diabetes, degenerative disc disease status post vertebral fusions, orthopedic surgeries, chronic pain, PTSD, mood disorder, depression, anxiety, OSA, anemia, COPD, CHF, BPH, pulmonary hypertension, anaphylaxis, hyperlipidemia, obesity presenting with shortness of breath, fatigue, nausea, body aches.  Patient has had gradually worsening symptoms for the past week.  Notably did not have dialysis for the past week.  Reportedly was discharged from previous dialysis center.  Now is having body aches, nausea, fatigue and shortness of breath which is gradually worsening.  Is on chronic oxygen at home for the past several weeks at 3 L.  Shortness of breath and fatigue worsens with activity improves with rest and oxygen.  Patient denies fevers, chills, chest pain, abdominal pain, constipation, diarrhea,  vomiting.  ED Course: Vital signs in the ED significant for blood pressure in the 185U to 314H systolic, currently on 5 L..  Lab workup showed sodium 130 which corrects considering glucose of 667, chloride 90,  BUN 119, creatinine 5.24 which is stable for ESRD, calcium 7.0, albumin 3.1.  CBC with hemoglobin at 9.6 which is near baseline of 10-12, platelets 120.  Lactic acid normal with repeat pending.  Respiratory panel for flu and COVID and RSV negative.  Blood cultures pending.  Chest x-ray showing hazy opacity at the left midlung likely left lower lung representing atelectasis versus early pneumonia, basilar atelectasis, right subclavian stent.  Patient received ceftriaxone, azithromycin, 10 units of insulin, Zofran, fentanyl in the ED.  Nephrology consulted and will see patient and arrange dialysis while  here.  Review of Systems: As per HPI otherwise all other systems reviewed and are negative.  Past Medical History:  Diagnosis Date   Altered mental status 05/04/2021   Arthritis    Blood transfusion without reported diagnosis    CHF (congestive heart failure) (HCC)    COPD (chronic obstructive pulmonary disease) ([place])    Diabetes mellitus without complication ([place])    ESRD on hemodialysis ([place])    Hypertension    PTSD (post-traumatic stress disorder)    Sepsis ([place])     Past Surgical History:  Procedure Laterality Date   A/V FISTULAGRAM N/A 08/30/2021   Procedure: A/V FISTULAGRAM;  Surgeon: Waynetta Sandy, MD;  Location: [place] [facility];  Service: Cardiovascular;  Laterality: N/A;   A/V FISTULAGRAM Right 07/28/2022   Procedure: A/V Fistulagram;  Surgeon: Waynetta Sandy, MD;  Location: [place] [facility];  Service: Cardiovascular;  Laterality: Right;   A/V FISTULAGRAM Right 09/05/2022   Procedure: A/V Fistulagram;  Surgeon: Waynetta Sandy, MD;  Location: [place] [facility];  Service: Cardiovascular;  Laterality: Right;   A/V FISTULAGRAM Right 10/31/2022   Procedure: A/V Fistulagram;  Surgeon: Waynetta Sandy, MD;  Location: [place] [facility];  Service: Cardiovascular;  Laterality: Right;   A/V SHUNT INTERVENTION Right 10/31/2022   Procedure: A/V SHUNT INTERVENTION;  Surgeon: Waynetta Sandy, MD;  Location: [place] [facility];  Service: Cardiovascular;  Laterality: Right;   A/V SHUNTOGRAM N/A 11/08/2021   Procedure: A/V SHUNTOGRAM;  Surgeon: Waynetta Sandy, MD;  Location: [place] [facility];  Service: Cardiovascular;  Laterality: N/A;   AMPUTATION Left 04/03/2021   Procedure: LEFT ABOVE-THE-KNEE AMPUTATION;  Surgeon: Erle Crocker, MD;  Location: [place];  Service: Orthopedics;  Laterality: Left;   AV FISTULA PLACEMENT Right 08/12/2021   Procedure: INSERTION OF ARTERIOVENOUS (AV) GORE-TEX GRAFT RIGHT ARM;  Surgeon:  Serafina Mitchell, MD;  Location: [place];  Service: Vascular;  Laterality: Right;   IR REMOVAL TUN CV CATH W/O FL  03/30/2021   JOINT REPLACEMENT     Bilateral knees   PERIPHERAL VASCULAR BALLOON ANGIOPLASTY Right 08/30/2021   Procedure: PERIPHERAL VASCULAR BALLOON ANGIOPLASTY;  Surgeon: Waynetta Sandy, MD;  Location: [place] [facility];  Service: Cardiovascular;  Laterality: Right;   PERIPHERAL VASCULAR BALLOON ANGIOPLASTY Right 07/28/2022   Procedure: PERIPHERAL VASCULAR BALLOON ANGIOPLASTY;  Surgeon: Waynetta Sandy, MD;  Location: [place] [facility];  Service: Cardiovascular;  Laterality: Right;  AVF   PERIPHERAL VASCULAR INTERVENTION  11/08/2021   Procedure: PERIPHERAL VASCULAR INTERVENTION;  Surgeon: Waynetta Sandy, MD;  Location: [place] [facility];  Service: Cardiovascular;;  Central rt arm fistula   UPPER EXTREMITY VENOGRAPHY Right 08/10/2021   Procedure: UPPER EXTREMITY VENOGRAPHY;  Surgeon: Serafina Mitchell, MD;  Location: [place] [facility];  Service: Cardiovascular;  Laterality: Right;    Social History  reports that he quit smoking about 11 years ago. His smoking use included cigarettes. He has never used smokeless tobacco. He reports current alcohol use. He reports that he does not currently use drugs.  Allergies  Allergen Reactions   Actos [Pioglitazone] Anaphylaxis and Rash   Dexmedetomidine Nausea And Vomiting    (Precedex) Dose-limiting bradycardia     Ibuprofen Shortness Of Breath and Swelling    Pt tolerates aspirin    Tomato Anaphylaxis    Only allergic to RAW tomatoes   Wellbutrin [Bupropion] Swelling    No family history on file.   Prior to Admission medications   Medication Sig Start Date End Date Taking? Authorizing Provider  ACCU-CHEK GUIDE test strip 4 (four) times daily. 11/15/20   [provider]  acetaminophen (TYLENOL) 325 MG tablet Take 2 tablets (650 mg total) by mouth every 6 (six) hours as needed for moderate  pain. 04/14/21   Raiford Noble Latif, DO  albuterol (PROVENTIL) (2.5 MG/3ML) 0.083% nebulizer solution Inhale 2.5 mg into the lungs every 8 (eight) hours as needed for wheezing or shortness of breath.    [provider]  albuterol (VENTOLIN HFA) 108 (90 Base) MCG/ACT inhaler Inhale 1-2 puffs into the lungs every 6 (six) hours as needed for wheezing or shortness of breath.    [provider]  amLODipine (NORVASC) 10 MG tablet Take 10 mg by mouth every morning. 04/17/20   [provider]  Ascorbic Acid (VITAMIN C PO) Take 1 tablet by mouth every Monday, Wednesday, and Friday.    [provider]  ASPIRIN LOW DOSE 81 MG EC tablet Take 81 mg by mouth in the morning. 02/22/21   [provider]  atorvastatin (LIPITOR) 40 MG tablet Take 40 mg by mouth in the morning. 02/09/21   [provider]  calcium acetate (PHOSLO) 667 MG capsule Take 2 capsules (1,334 mg total) by mouth with breakfast, with lunch, and with evening meal. Patient taking differently: Take 1,334 mg by mouth 2 (two) times daily with a meal. 04/10/21   Debbe Odea, MD  carvedilol (COREG) 12.5 MG tablet Take 12.5 mg by mouth 2 (two) times daily with a meal. 04/17/20   [provider]  clopidogrel (PLAVIX) 75 MG tablet Take 1 tablet (75 mg total) by mouth daily. 11/02/22  Rhyne, Hulen Shouts, PA-C  Continuous Blood Gluc Receiver (DEXCOM G6 RECEIVER) DEVI Use 1 Device continuously 03/08/21   [provider]  cyclobenzaprine (FLEXERIL) 10 MG tablet Take 1 tablet (10 mg total) by mouth 2 (two) times daily as needed for muscle spasms. 12/25/21   Curatolo, Adam, DO  diazepam (VALIUM) 5 MG tablet Take 5 mg by mouth in the morning and at bedtime. 12/09/21   [provider]  diphenhydrAMINE (BENADRYL) 25 MG tablet Take 50 mg by mouth every 6 (six) hours as needed for itching.    [provider]  insulin regular human CONCENTRATED (HUMULIN R U-500 KWIKPEN) 500 UNIT/ML  KwikPen Inject 45 Units into the skin daily with supper. Patient taking differently: Inject 125-150 Units into the skin 2 (two) times daily with a meal. Sliding scale 09/04/21 08/30/24  Bonnell Public, MD  Methoxy PEG-Epoetin Beta (MIRCERA IJ) Mircera 08/22/22 08/21/23  [provider]  metolazone (ZAROXOLYN) 10 MG tablet Take 20 mg by mouth daily. 07/13/21   [provider]  nortriptyline (PAMELOR) 10 MG capsule Take 20 mg by mouth at bedtime. 10/23/19 08/30/24  [provider]  pantoprazole (PROTONIX) 40 MG tablet Take 40 mg by mouth daily before breakfast. 02/09/21   [provider]  polyethylene glycol powder (GLYCOLAX/MIRALAX) 17 GM/SCOOP powder Take 17 g by mouth daily as needed for mild constipation. 08/24/20   [provider]  pregabalin (LYRICA) 75 MG capsule Take 75 mg by mouth in the morning. 08/22/22   [provider]  senna-docusate (SENOKOT-S) 8.6-50 MG tablet Take 2 tablets by mouth 2 (two) times daily as needed for mild constipation. 10/23/19   [provider]  sertraline (ZOLOFT) 100 MG tablet Take 100 mg by mouth in the morning. 02/22/21   [provider]  tamsulosin (FLOMAX) 0.4 MG CAPS capsule Take 1 capsule (0.4 mg total) by mouth daily. 11/10/21   Darliss Cheney, MD  torsemide (DEMADEX) 20 MG tablet Take 100 mg by mouth 2 (two) times daily.    [provider]  traZODone (DESYREL) 150 MG tablet Take 150 mg by mouth at bedtime as needed for sleep. 12/09/21   [provider]    Physical Exam: Vitals:   11/24/22 0958 11/24/22 1214 11/24/22 1330 11/24/22 1523  BP: (!) 155/54 (!) 156/65 (!) 143/65 (!) 117/52  Pulse: 92 96 96 94  Resp: 20 (!) 21 20 20   Temp: 98.9 F (37.2 C) 97.7 F (36.5 C) 97.8 F (36.6 C)   TempSrc:   Oral   SpO2: 91% 93% 92% 93%    Physical Exam Constitutional:      General: He is not in acute distress.    Appearance: Normal appearance. He is obese.  HENT:     Head:  Normocephalic and atraumatic.     Mouth/Throat:     Mouth: Mucous membranes are moist.     Pharynx: Oropharynx is clear.  Eyes:     Extraocular Movements: Extraocular movements intact.     Pupils: Pupils are equal, round, and reactive to light.  Cardiovascular:     Rate and Rhythm: Normal rate and regular rhythm.     Pulses: Normal pulses.     Heart sounds: Normal heart sounds.  Pulmonary:     Effort: Pulmonary effort is normal. No respiratory distress.     Breath sounds: Decreased breath sounds present.  Abdominal:     General: Bowel sounds are normal. There is no distension.     Palpations: Abdomen is  soft.     Tenderness: There is no abdominal tenderness.  Musculoskeletal:        General: No swelling or deformity.     Right lower leg: Edema present.     Left lower leg: Edema present.     Comments: S/p L BKA  Skin:    General: Skin is warm and dry.  Neurological:     General: No focal deficit present.     Mental Status: Mental status is at baseline.    Labs on Admission: I have personally reviewed following labs and imaging studies  CBC: Recent Labs  Lab 11/24/22 1414 11/24/22 1422  WBC 7.3  --   NEUTROABS 6.3  --   HGB 9.6* 10.2*  HCT 30.5* 30.0*  MCV 91.0  --   PLT 120*  --     Basic Metabolic Panel: Recent Labs  Lab 11/24/22 1414 11/24/22 1422  NA 130* 130*  K 4.2 4.2  CL 90* 89*  CO2 25  --   GLUCOSE 667* 675*  BUN 119* >130*  CREATININE 5.24* 5.50*  CALCIUM 7.0*  --     GFR: CrCl cannot be calculated (Unknown ideal weight.).  Liver Function Tests: Recent Labs  Lab 11/24/22 1414  AST 11*  ALT 15  ALKPHOS 68  BILITOT 0.5  PROT 7.0  ALBUMIN 3.1*    Urine analysis:    Component Value Date/Time   COLORURINE STRAW (A) 11/07/2021 0033   APPEARANCEUR CLEAR 11/07/2021 0033   LABSPEC 1.006 11/07/2021 0033   PHURINE 5.0 11/07/2021 0033   GLUCOSEU NEGATIVE 11/07/2021 0033   HGBUR NEGATIVE 11/07/2021 0033   BILIRUBINUR NEGATIVE 11/07/2021  0033   KETONESUR NEGATIVE 11/07/2021 0033   PROTEINUR NEGATIVE 11/07/2021 0033   NITRITE NEGATIVE 11/07/2021 0033   LEUKOCYTESUR NEGATIVE 11/07/2021 0033    Radiological Exams on Admission: DG Chest 1 View  Result Date: 11/23/2022 CLINICAL DATA:  Chest pain and shortness of breath EXAM: CHEST  1 VIEW COMPARISON:  Multiple exams, including 10/19/2022 and CT scan 10/20/2022 FINDINGS: Continued fullness of the right hilum, although this was shown to be vascular on the CT scan from 10/20/2022. Hazy opacity in the left mid lung is indistinct and does not silhouette the cardiac border, accordingly more likely in the left lower lobe. This could reflect atelectasis or early pneumonia. Right subclavian stent. Thoracic spondylosis. Reverse right shoulder arthroplasty. Cervical fixators noted. Indistinct basilar opacities in the retrocardiac position and medial right lung base favoring subsegmental atelectasis. IMPRESSION: 1. Hazy opacity in the left mid lung, most likely in the left lower lobe, potentially from atelectasis or early pneumonia. 2. Mild bibasilar atelectasis. 3. Right subclavian stent. 4. Thoracic spondylosis. 5. Reverse right shoulder arthroplasty. Electronically Signed   By: Van Clines M.D.   On: 11/23/2022 20:32    EKG: Independently reviewed.  Sinus rhythm at 100 bpm.  Some baseline artifact.  Nonspecific T wave changes.  Assessment/Plan Principal Problem:   Volume overload Active Problems:   ESRD (end stage renal disease) (HCC)   COPD (chronic obstructive pulmonary disease) (HCC)   Type 2 diabetes mellitus with hyperlipidemia (HCC)   PTSD (post-traumatic stress disorder)   Mood disorder (HCC)   Chronic diastolic heart failure (HCC)   Chronic midline low back pain without sciatica   Class 3 severe obesity due to excess calories with serious comorbidity and body mass index (BMI) of 45.0 to 49.9 in adult [facility])   Iron deficiency anemia, unspecified   Mild episode of recurrent  major  depressive disorder ([place])   Mixed hyperlipidemia   OSA on CPAP   Acquired absence of left leg below knee (HCC)   Benign prostatic hyperplasia with weak urinary stream   Acute on chronic respiratory failure with hypoxia (HCC)   Uremia   Volume overload ESRD Hypocalcemia Uremia Acute on chronic respiratory failure with hypoxia > as well as nausea and body aches. Patient presenting with worsening shortness of breath and fatigue > Has not had dialysis in a week due to being discharged from his dialysis center per reports.  > Has been on chronic oxygen for at least the past several weeks at 3 L.  Currently needing 5 L in the ED. > Lab workup in the ED showed BUN elevated to 119, calcium 7.  Volume overloaded on exam > Nephrology consulted and will arrange dialysis - Appreciate nephrology recommendations - Dialysis per nephrology - Monitor renal function and electrolytes following dialysis - Fluid restricted renal diet - Daily weights  Abnormal chest x-ray Fatigue and bodyaches > With patient's fatigue and bodyaches as well as questionable early pneumonia on chest x-ray patient received ceftriaxone and azithromycin in the ED. > Fatigue may also be due to volume overload.  Body aches may be viral in etiology, patient was negative for flu COVID and RSV. - Hold off on further antibiotics - Check full RVP - Trend fever curve and WBC  Anemia > Hemoglobin near baseline at 9.6 also degree of hemodilution likely.   - Trend CBC  Diabetes > Currently on 125-150 units BID outpatient.  Glucose elevated to 667 in ED. > Received 10 units in the ED. - Monitor CBG - 60 units BID - SSI  Degenerative disc disease Chronic pain > Status post multiple surgical interventions. - Continue home nortriptyline, Lyrica  PTSD Mood disorder Depression - Continue home sertraline, nortriptyline, diazepam, trazodone   OSA - CPAP  COPD > History of this listed in chart, do not see any  maintenance inhalers. - Continue as needed albuterol  ?PossibleDiastolic CHF > Listed in chart.  Last echo showed EF 27-51%, normal diastolic function, normal RV function. > Volume issues appear to be related to renal disease and missed dialysis only. - Volume management with dialysis - Also on Lasix and metolazone  Hypertension - Continue home amlodipine, carvedilol - Torsemide, metolazone  Hyperlipidemia - Continue home atorvastatin  BPH - Continue home Flomax  Obesity Status post left BKA - Noted While DVT prophylaxis: Heparin Code Status:   Full, discussed with patient Family Communication:  None on admission Disposition Plan:   Patient is from:  Home  Anticipated DC to:  Home  Anticipated DC date:  1 to 2 days  Anticipated DC barriers: None  Consults called:  Nephrology Admission status:  Observation, telemetry  Severity of Illness: The appropriate patient status for this patient is OBSERVATION. Observation status is judged to be reasonable and necessary in order to provide the required intensity of service to ensure the patient's safety. The patient's presenting symptoms, physical exam findings, and initial radiographic and laboratory data in the context of their medical condition is felt to place them at decreased risk for further clinical deterioration. Furthermore, it is anticipated that the patient will be medically stable for discharge from the hospital within 2 midnights of admission.    Marcelyn Bruins MD [facility]  How to contact the [facility] Attending or Consulting provider [place] or covering provider during after hours [place], for this patient?   Check the  care team in [facility] and look for a) attending/consulting [place] provider listed and b) the [facility] team listed Log into [email or web address] and use [place]'s universal password to access. If you do not have the password, please contact the hospital operator. Locate the [facility] provider you are looking for under  [facility] and page to a number that you can be directly reached. If you still have difficulty reaching the provider, please page the [facility] (Director on Call) for the Hospitalists listed on amion for assistance.  11/24/2022, 5:09 PM

## 2022-11-24 NOTE — ED Notes (Signed)
Pt called out and reported having burning pain in R arm. Pt denies any new injury to arm. Visual inspection negative. Pulses present and fistula doesn't appear to have any injury. MD made aware

## 2022-11-24 NOTE — Consult Note (Signed)
Renal Service Consult Note [facility]  Jeffrey [name] 11/24/2022 Sol Blazing, MD Requesting Physician: Dr. Melina Copa  Reason for Consult: ESRD pt w/o OP unit presenting w/ leg swelling, last HD 2 wks ago HPI: The patient is a 61 y.o. year-old w/ PMH as below who presented to ED c/o CP, SOB and leg swelling. Pt was released from his OP HD unit 2 wks ago for threatening behavior, his last HD was 12/15. We are asked to see for dialysis.   Pt seen in ED, daughter at bedside. Pt has hx of PTSD. Main c/o is SOB.  Last HD 2 wks ago.    ROS - denies CP, no joint pain, no HA, no blurry vision, no rash, no diarrhea, no nausea/ vomiting, no dysuria, no difficulty voiding   Past Medical History  Past Medical History:  Diagnosis Date   Arthritis    Blood transfusion without reported diagnosis    CHF (congestive heart failure) (HCC)    COPD (chronic obstructive pulmonary disease) ([place])    Diabetes mellitus without complication ([place])    Hypertension    PTSD (post-traumatic stress disorder)    Renal disorder    Stage 5 Kidney Failure   Past Surgical History  Past Surgical History:  Procedure Laterality Date   A/V FISTULAGRAM N/A 08/30/2021   Procedure: A/V FISTULAGRAM;  Surgeon: Waynetta Sandy, MD;  Location: [facility];  Service: Cardiovascular;  Laterality: N/A;   A/V FISTULAGRAM Right 07/28/2022   Procedure: A/V Fistulagram;  Surgeon: Waynetta Sandy, MD;  Location: [facility];  Service: Cardiovascular;  Laterality: Right;   A/V FISTULAGRAM Right 09/05/2022   Procedure: A/V Fistulagram;  Surgeon: Waynetta Sandy, MD;  Location: [facility];  Service: Cardiovascular;  Laterality: Right;   A/V FISTULAGRAM Right 10/31/2022   Procedure: A/V Fistulagram;  Surgeon: Waynetta Sandy, MD;  Location: [facility];  Service: Cardiovascular;  Laterality: Right;   A/V SHUNT INTERVENTION Right 10/31/2022   Procedure: A/V  SHUNT INTERVENTION;  Surgeon: Waynetta Sandy, MD;  Location: [facility];  Service: Cardiovascular;  Laterality: Right;   A/V SHUNTOGRAM N/A 11/08/2021   Procedure: A/V SHUNTOGRAM;  Surgeon: Waynetta Sandy, MD;  Location: [facility];  Service: Cardiovascular;  Laterality: N/A;   AMPUTATION Left 04/03/2021   Procedure: LEFT ABOVE-THE-KNEE AMPUTATION;  Surgeon: Erle Crocker, MD;  Location: [facility];  Service: Orthopedics;  Laterality: Left;   AV FISTULA PLACEMENT Right 08/12/2021   Procedure: INSERTION OF ARTERIOVENOUS (AV) GORE-TEX GRAFT RIGHT ARM;  Surgeon: Serafina Mitchell, MD;  Location: [facility];  Service: Vascular;  Laterality: Right;   IR REMOVAL TUN CV CATH W/O FL  03/30/2021   JOINT REPLACEMENT     Bilateral knees   PERIPHERAL VASCULAR BALLOON ANGIOPLASTY Right 08/30/2021   Procedure: PERIPHERAL VASCULAR BALLOON ANGIOPLASTY;  Surgeon: Waynetta Sandy, MD;  Location: [facility];  Service: Cardiovascular;  Laterality: Right;   PERIPHERAL VASCULAR BALLOON ANGIOPLASTY Right 07/28/2022   Procedure: PERIPHERAL VASCULAR BALLOON ANGIOPLASTY;  Surgeon: Waynetta Sandy, MD;  Location: [facility];  Service: Cardiovascular;  Laterality: Right;  AVF   PERIPHERAL VASCULAR INTERVENTION  11/08/2021   Procedure: PERIPHERAL VASCULAR INTERVENTION;  Surgeon: Waynetta Sandy, MD;  Location: [facility];  Service: Cardiovascular;;  Central rt arm fistula   UPPER EXTREMITY VENOGRAPHY Right 08/10/2021   Procedure: UPPER EXTREMITY VENOGRAPHY;  Surgeon: Serafina Mitchell, MD;  Location: [facility];  Service: Cardiovascular;  Laterality: Right;   Family History No family history on file. Social History  reports that he quit smoking about 11 years ago. His smoking use included cigarettes. He has never used smokeless tobacco. He reports current alcohol use. He reports that he does not currently use drugs. 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 Authorizing Provider  ACCU-CHEK GUIDE test strip 4 (four) times daily. 11/15/20   [provider]  acetaminophen (TYLENOL) 325 MG tablet Take 2 tablets (650 mg total) by mouth every 6 (six) hours as needed for moderate pain. 04/14/21   Raiford Noble Latif, DO  albuterol (PROVENTIL) (2.5 MG/3ML) 0.083% nebulizer solution Inhale 2.5 mg into the lungs every 8 (eight) hours as needed for wheezing or shortness of breath.    [provider]  albuterol (VENTOLIN HFA) 108 (90 Base) MCG/ACT inhaler Inhale 1-2 puffs into the lungs every 6 (six) hours as needed for wheezing or shortness of breath.    [provider]  amLODipine (NORVASC) 10 MG tablet Take 10 mg by mouth every morning. 04/17/20   [provider]  Ascorbic Acid (VITAMIN C PO) Take 1 tablet by mouth every Monday, Wednesday, and Friday.    [provider]  ASPIRIN LOW DOSE 81 MG EC tablet Take 81 mg by mouth in the morning. 02/22/21   [provider]  atorvastatin (LIPITOR) 40 MG tablet Take 40 mg by mouth in the morning. 02/09/21   [provider]  calcium acetate (PHOSLO) 667 MG capsule Take 2 capsules (1,334 mg total) by mouth with breakfast, with lunch, and with evening meal. Patient taking differently: Take 1,334 mg by mouth 2 (two) times daily with a meal. 04/10/21   Debbe Odea, MD  carvedilol (COREG) 12.5 MG tablet Take 12.5 mg by mouth 2 (two) times daily with a meal. 04/17/20   [provider]  clopidogrel (PLAVIX) 75 MG tablet Take 1 tablet (75 mg total) by mouth  daily. 11/02/22   Rhyne, Hulen Shouts, PA-C  Continuous Blood Gluc Receiver (DEXCOM G6 RECEIVER) DEVI Use 1 Device continuously 03/08/21   [provider]  cyclobenzaprine (FLEXERIL) 10 MG tablet Take 1 tablet (10 mg total) by mouth 2 (two) times daily as needed for muscle spasms. 12/25/21   Curatolo, Adam, DO  diazepam (VALIUM) 5 MG tablet Take 5 mg by mouth in the morning and at bedtime. 12/09/21   [provider]  diphenhydrAMINE (BENADRYL) 25 MG tablet Take 50 mg by mouth every 6 (six) hours as needed for itching.    [provider]  insulin regular human CONCENTRATED (HUMULIN R U-500 KWIKPEN) 500 UNIT/ML KwikPen Inject 45 Units into the skin daily with supper. Patient taking differently: Inject 125-150 Units into the skin 2 (two) times daily with a meal. Sliding scale 09/04/21 08/30/24  Bonnell Public, MD  Methoxy PEG-Epoetin Beta (MIRCERA IJ) Mircera 08/22/22 08/21/23  [provider]  metolazone (ZAROXOLYN) 10 MG tablet Take 20 mg by mouth daily. 07/13/21   [provider]  nortriptyline (PAMELOR) 10 MG capsule Take  20 mg by mouth at bedtime. 10/23/19 08/30/24  [provider]  pantoprazole (PROTONIX) 40 MG tablet Take 40 mg by mouth daily before breakfast. 02/09/21   [provider]  polyethylene glycol powder (GLYCOLAX/MIRALAX) 17 GM/SCOOP powder Take 17 g by mouth daily as needed for mild constipation. 08/24/20   [provider]  pregabalin (LYRICA) 75 MG capsule Take 75 mg by mouth in the morning. 08/22/22   [provider]  senna-docusate (SENOKOT-S) 8.6-50 MG tablet Take 2 tablets by mouth 2 (two) times daily as needed for mild constipation. 10/23/19   [provider]  sertraline (ZOLOFT) 100 MG tablet Take 100 mg by mouth in the morning. 02/22/21   [provider]  tamsulosin (FLOMAX) 0.4 MG CAPS capsule Take 1 capsule (0.4 mg total) by mouth daily. 11/10/21   Darliss Cheney, MD  torsemide (DEMADEX)  20 MG tablet Take 100 mg by mouth 2 (two) times daily.    [provider]  traZODone (DESYREL) 150 MG tablet Take 150 mg by mouth at bedtime as needed for sleep. 12/09/21   [provider]     Vitals:   11/24/22 0958 11/24/22 1214 11/24/22 1330 11/24/22 1523  BP: (!) 155/54 (!) 156/65 (!) 143/65 (!) 117/52  Pulse: 92 96 96 94  Resp: 20 (!) 21 20 20   Temp: 98.9 F (37.2 C) 97.7 F (36.5 C) 97.8 F (36.6 C)   TempSrc:   Oral   SpO2: 91% 93% 92% 93%   Exam Gen alert, no distress, pleasant today No rash, cyanosis or gangrene Sclera anicteric, throat clear  No jvd or bruits Chest occ basilar rales bilat, no ^wob RRR no MRG Abd soft ntnd no mass or ascites +bs GU normal male MS no joint effusions or deformity Ext diffuse 2-3+ bilat pitting LE edema Neuro is alert, Ox 3 , nf    RUA AVG +bruit    Home meds include - amlodipine 10, asa, lipitor, phoslo 2 ac tid, coreg 12.5 bid, plavix, valium, insulin regular, zaroxolyn 20 qd, pamelor, protonix, lyrica, sertraline, flomax, demadex, trazodone, prns/ vits/ supps     OP HD: most recent orders >   3h 49min   129kg     CXR - L infiltrate vs atx, no edema  Assessment/ Plan: SOB/ vol overload - marked LE edema, was released from his OP HD unit 12/15 due to behavior issues, no HD x 2 wks. Plan HD later tonight.  ESRD - released from OP HD unit recently as above. HD as above.  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  MBD ckd - CCa in range, add on phos. Cont phoslo 2 ac tid.  PTSD/ hx psychosis      Kelly Splinter  MD 11/24/2022, 3:34 PM Recent Labs  Lab 11/24/22 1414 11/24/22 1422  HGB 9.6* 10.2*  CREATININE  --  5.50*  K  --  4.2   Inpatient medications:   azithromycin 500 mg (11/24/22 1448)

## 2022-11-24 NOTE — ED Provider Notes (Signed)
[facility] EMERGENCY DEPARTMENT Provider Note   CSN: [id number] Arrival date & time: 11/23/22  1950     History  Chief Complaint  Patient presents with   Missed Multiple hemodialysis Treatment    CP/SOB     Jeffrey [name] is a 61 y.o. male.  He is here with multiple complaints.  He is shortness of breath and fatigue, diffuse myalgias, nausea.  He has not had his dialysis in over a week.  He said he was discharged from his last dialysis center.  He normally uses 3 L of oxygen, this was started a few weeks ago.  He has a cough but has not really produce much sputum.  No known fever.  He said he feels terrible and weak all over.  The history is provided by the patient.  Shortness of Breath Severity:  Severe Onset quality:  Gradual Duration:  1 week Timing:  Constant Progression:  Worsening Chronicity:  New Relieved by:  Nothing Worsened by:  Activity and movement Ineffective treatments:  Rest and oxygen Associated symptoms: cough and vomiting   Associated symptoms: no abdominal pain, no chest pain, no fever, no hemoptysis and no sputum production        Home Medications Prior to Admission medications   Medication Sig Start Date End Date Taking? Authorizing Provider  ACCU-CHEK GUIDE test strip 4 (four) times daily. 11/15/20   [provider]  acetaminophen (TYLENOL) 325 MG tablet Take 2 tablets (650 mg total) by mouth every 6 (six) hours as needed for moderate pain. 04/14/21   Raiford Noble Latif, DO  albuterol (PROVENTIL) (2.5 MG/3ML) 0.083% nebulizer solution Inhale 2.5 mg into the lungs every 8 (eight) hours as needed for wheezing or shortness of breath.    [provider]  albuterol (VENTOLIN HFA) 108 (90 Base) MCG/ACT inhaler Inhale 1-2 puffs into the lungs every 6 (six) hours as needed for wheezing or shortness of breath.    [provider]  amLODipine (NORVASC) 10 MG tablet Take 10 mg by mouth every morning. 04/17/20   [provider]  Ascorbic Acid (VITAMIN C PO) Take 1 tablet by mouth every Monday, Wednesday, and Friday.    [provider]  ASPIRIN LOW DOSE 81 MG EC tablet Take 81 mg by mouth in the morning. 02/22/21   [provider]  atorvastatin (LIPITOR) 40 MG tablet Take 40 mg by mouth in the morning. 02/09/21   [provider]  calcium acetate (PHOSLO) 667 MG capsule Take 2 capsules (1,334 mg total) by mouth with breakfast, with lunch, and with evening meal. Patient taking differently: Take 1,334 mg by mouth 2 (two) times daily with a meal. 04/10/21   Debbe Odea, MD  carvedilol (COREG) 12.5 MG tablet Take 12.5 mg by mouth 2 (two) times daily with a meal. 04/17/20   [provider]  clopidogrel (PLAVIX) 75 MG tablet Take 1 tablet (75 mg total) by mouth daily. 11/02/22   Rhyne, Hulen Shouts, PA-C  Continuous Blood Gluc Receiver (DEXCOM G6 RECEIVER) DEVI Use 1 Device continuously 03/08/21   [provider]  cyclobenzaprine (FLEXERIL) 10 MG tablet Take 1 tablet (10 mg total) by mouth 2 (two) times daily as needed for muscle spasms. 12/25/21   Curatolo, Adam, DO  diazepam (VALIUM) 5 MG tablet Take 5 mg by mouth in the morning and at bedtime. 12/09/21   [provider]  diphenhydrAMINE (BENADRYL) 25 MG tablet Take 50 mg by mouth every 6 (six) hours as needed  for itching.    [provider]  insulin regular human CONCENTRATED (HUMULIN R U-500 KWIKPEN) 500 UNIT/ML KwikPen Inject 45 Units into the skin daily with supper. Patient taking differently: Inject 125-150 Units into the skin 2 (two) times daily with a meal. Sliding scale 09/04/21 08/30/24  Bonnell Public, MD  Methoxy PEG-Epoetin Beta (MIRCERA IJ) Mircera 08/22/22 08/21/23  [provider]  metolazone (ZAROXOLYN) 10 MG tablet Take 20 mg by mouth daily. 07/13/21   [provider]  nortriptyline (PAMELOR) 10 MG capsule Take 20 mg by mouth at bedtime. 10/23/19 08/30/24  [provider]  pantoprazole (PROTONIX) 40 MG tablet Take 40 mg by mouth daily before breakfast. 02/09/21   [provider]  polyethylene glycol powder (GLYCOLAX/MIRALAX) 17 GM/SCOOP powder Take 17 g by mouth daily as needed for mild constipation. 08/24/20   [provider]  pregabalin (LYRICA) 75 MG capsule Take 75 mg by mouth in the morning. 08/22/22   [provider]  senna-docusate (SENOKOT-S) 8.6-50 MG tablet Take 2 tablets by mouth 2 (two) times daily as needed for mild constipation. 10/23/19   [provider]  sertraline (ZOLOFT) 100 MG tablet Take 100 mg by mouth in the morning. 02/22/21   [provider]  tamsulosin (FLOMAX) 0.4 MG CAPS capsule Take 1 capsule (0.4 mg total) by mouth daily. 11/10/21   Darliss Cheney, MD  torsemide (DEMADEX) 20 MG tablet Take 100 mg by mouth 2 (two) times daily.    [provider]  traZODone (DESYREL) 150 MG tablet Take 150 mg by mouth at bedtime as needed for sleep. 12/09/21   [provider]      Allergies    Actos [pioglitazone], Dexmedetomidine, Ibuprofen, Tomato, and Wellbutrin [bupropion]    Review of Systems   Review of Systems  Constitutional:  Positive for fatigue. Negative for fever.  Respiratory:  Positive for cough and shortness of breath. Negative for hemoptysis and sputum production.   Cardiovascular:  Negative for chest pain.  Gastrointestinal:  Positive for nausea and vomiting. Negative for abdominal pain.  Musculoskeletal:  Positive for myalgias.    Physical Exam Updated Vital Signs BP (!) 156/65   Pulse 96   Temp 97.7 F (36.5 C)   Resp (!) 21   SpO2 93%  Physical Exam Vitals and nursing note reviewed.  Constitutional:      General: He is in acute distress.     Appearance: Normal appearance. He is well-developed. He is obese.  HENT:     Head: Normocephalic and atraumatic.  Eyes:     Conjunctiva/sclera: Conjunctivae normal.  Cardiovascular:     Rate and Rhythm: Normal rate  and regular rhythm.     Heart sounds: No murmur heard. Pulmonary:     Effort: Pulmonary effort is normal. No respiratory distress.     Breath sounds: Normal breath sounds.  Abdominal:     Palpations: Abdomen is soft.     Tenderness: There is no abdominal tenderness. There is no guarding or rebound.  Musculoskeletal:        General: No swelling.     Cervical back: Neck supple.     Comments: He has a left AKA.  Fistula in right upper arm with positive thrill.  Skin:    General: Skin is warm and dry.     Capillary Refill: Capillary refill takes less than 2 seconds.  Neurological:     Mental Status: He is alert. Mental status is at baseline.  ED Results / Procedures / Treatments   Labs (all labs ordered are listed, but only abnormal results are displayed) Labs Reviewed  COMPREHENSIVE METABOLIC PANEL - Abnormal; Notable for the following components:      Result Value   Sodium 130 (*)    Chloride 90 (*)    Glucose, Bld 667 (*)    BUN 119 (*)    Creatinine, Ser 5.24 (*)    Calcium 7.0 (*)    Albumin 3.1 (*)    AST 11 (*)    GFR, Estimated 12 (*)    All other components within normal limits  CBC WITH DIFFERENTIAL/PLATELET - Abnormal; Notable for the following components:   RBC 3.35 (*)    Hemoglobin 9.6 (*)    HCT 30.5 (*)    Platelets 120 (*)    Lymphs Abs 0.3 (*)    Abs Immature Granulocytes 0.08 (*)    All other components within normal limits  CBG MONITORING, ED - Abnormal; Notable for the following components:   Glucose-Capillary >600 (*)    All other components within normal limits  I-STAT CHEM 8, ED - Abnormal; Notable for the following components:   Sodium 130 (*)    Chloride 89 (*)    BUN >130 (*)    Creatinine, Ser 5.50 (*)    Glucose, Bld 675 (*)    Calcium, Ion 0.86 (*)    Hemoglobin 10.2 (*)    HCT 30.0 (*)    All other components within normal limits  CBG MONITORING, ED - Abnormal; Notable for the following components:   Glucose-Capillary 430 (*)     All other components within normal limits  RESP PANEL BY RT-PCR (RSV, FLU A&B, COVID)  RVPGX2  CULTURE, BLOOD (ROUTINE X 2)  CULTURE, BLOOD (ROUTINE X 2)  LACTIC ACID, PLASMA  HEPATITIS B SURFACE ANTIGEN  HEPATITIS B SURFACE ANTIBODY, QUANTITATIVE  HIV ANTIBODY (ROUTINE TESTING W REFLEX)  CBC  RENAL FUNCTION PANEL  HEMOGLOBIN A1C    EKG EKG Interpretation  Date/Time:  Wednesday November 23 2022 20:10:13 EST Ventricular Rate:  100 PR Interval:  144 QRS Duration: 82 QT Interval:  372 QTC Calculation: 479 R Axis:   81 Text Interpretation: Normal sinus rhythm Normal ECG When compared with ECG of 19-Oct-2022 23:19, PREVIOUS ECG IS PRESENT Confirmed by Cindee Lame [phone]) on 11/23/2022 8:16:45 PM  Radiology DG Chest 1 View  Result Date: 11/23/2022 CLINICAL DATA:  Chest pain and shortness of breath EXAM: CHEST  1 VIEW COMPARISON:  Multiple exams, including 10/19/2022 and CT scan 10/20/2022 FINDINGS: Continued fullness of the right hilum, although this was shown to be vascular on the CT scan from 10/20/2022. Hazy opacity in the left mid lung is indistinct and does not silhouette the cardiac border, accordingly more likely in the left lower lobe. This could reflect atelectasis or early pneumonia. Right subclavian stent. Thoracic spondylosis. Reverse right shoulder arthroplasty. Cervical fixators noted. Indistinct basilar opacities in the retrocardiac position and medial right lung base favoring subsegmental atelectasis. IMPRESSION: 1. Hazy opacity in the left mid lung, most likely in the left lower lobe, potentially from atelectasis or early pneumonia. 2. Mild bibasilar atelectasis. 3. Right subclavian stent. 4. Thoracic spondylosis. 5. Reverse right shoulder arthroplasty. Electronically Signed   By: Van Clines M.D.   On: 11/23/2022 20:32    Procedures .Critical Care  Performed by: Hayden Rasmussen, MD Authorized by: Hayden Rasmussen, MD   Critical care provider statement:     Critical care time (  minutes):  45   Critical care time was exclusive of:  Separately billable procedures and treating other patients   Critical care was necessary to treat or prevent imminent or life-threatening deterioration of the following conditions:  Respiratory failure and renal failure   Critical care was time spent personally by me on the following activities:  Development of treatment plan with patient or surrogate, discussions with consultants, evaluation of patient's response to treatment, examination of patient, obtaining history from patient or surrogate, ordering and performing treatments and interventions, ordering and review of laboratory studies, ordering and review of radiographic studies, pulse oximetry, re-evaluation of patient's condition and review of old charts   I assumed direction of critical care for this patient from another provider in my specialty: no       Medications Ordered in ED Medications  Chlorhexidine Gluconate Cloth 2 % PADS 6 each (has no administration in time range)  heparin injection 3,000 Units (has no administration in time range)  aspirin EC tablet 81 mg (has no administration in time range)  albuterol (PROVENTIL) (2.5 MG/3ML) 0.083% nebulizer solution 2.5 mg (has no administration in time range)  heparin injection 5,000 Units (5,000 Units Subcutaneous Given 11/24/22 1753)  sodium chloride flush (NS) 0.9 % injection 3 mL (has no administration in time range)  acetaminophen (TYLENOL) tablet 650 mg (has no administration in time range)    Or  acetaminophen (TYLENOL) suppository 650 mg (has no administration in time range)  polyethylene glycol (MIRALAX / GLYCOLAX) packet 17 g (has no administration in time range)  amLODipine (NORVASC) tablet 10 mg (has no administration in time range)  atorvastatin (LIPITOR) tablet 40 mg (has no administration in time range)  calcium acetate (PHOSLO) capsule 1,334 mg (1,334 mg Oral Given 11/24/22 1752)  clopidogrel  (PLAVIX) tablet 75 mg (has no administration in time range)  carvedilol (COREG) tablet 12.5 mg (12.5 mg Oral Given 11/24/22 1753)  diazepam (VALIUM) tablet 5 mg (has no administration in time range)  metolazone (ZAROXOLYN) tablet 20 mg (has no administration in time range)  nortriptyline (PAMELOR) capsule 20 mg (has no administration in time range)  pantoprazole (PROTONIX) EC tablet 40 mg (has no administration in time range)  pregabalin (LYRICA) capsule 75 mg (has no administration in time range)  sertraline (ZOLOFT) tablet 100 mg (has no administration in time range)  tamsulosin (FLOMAX) capsule 0.4 mg (has no administration in time range)  torsemide (DEMADEX) tablet 100 mg (100 mg Oral Given 11/24/22 1752)  traZODone (DESYREL) tablet 150 mg (has no administration in time range)  insulin aspart (novoLOG) injection 0-20 Units (has no administration in time range)  insulin aspart (novoLOG) injection 0-5 Units (has no administration in time range)  insulin glargine-yfgn (SEMGLEE) injection 60 Units (has no administration in time range)  fentaNYL (SUBLIMAZE) injection 50 mcg (50 mcg Intravenous Given 11/24/22 1441)  ondansetron (ZOFRAN) injection 4 mg (4 mg Intravenous Given 11/24/22 1441)  insulin aspart (novoLOG) injection 10 Units (10 Units Intravenous Given 11/24/22 1442)  cefTRIAXone (ROCEPHIN) 1 g in sodium chloride 0.9 % 100 mL IVPB (0 g Intravenous Stopped 11/24/22 1516)  azithromycin (ZITHROMAX) 500 mg in sodium chloride 0.9 % 250 mL IVPB (0 mg Intravenous Stopped 11/24/22 1612)    ED Course/ Medical Decision Making/ A&P Clinical Course as of 11/24/22 1805  Thu Nov 24, 2022  1416 Discussed with Dr. Jonnie Finner from nephrology who will evaluate patient for possible dialysis [MB]  1615 Discussed with [facility] hospitalist regarding admission. [MB]    Clinical  Course User Index [MB] Hayden Rasmussen, MD                           Medical Decision Making Amount and/or Complexity of Data  Reviewed Labs: ordered.  Risk Prescription drug management. Decision regarding hospitalization.   This patient complains of shortness of breath myalgias nausea vomiting; this involves an extensive number of treatment Options and is a complaint that carries with it a high risk of complications and morbidity. The differential includes hypoxia, renal failure, pneumonia, COVID, flu, metabolic derangement, anemia  I ordered, reviewed and interpreted labs, which included CBC with normal white count, hemoglobin low stable from priors, chemistries with low sodium markedly elevated glucose elevated creatinine, i-STAT without hyperkalemia, blood culture sent, COVID and flu negative I ordered medication IV fluids antibiotics insulin and reviewed PMP when indicated. I ordered imaging studies which included chest x-ray and I independently    visualized and interpreted imaging which showed possible pneumonia Additional history obtained from patient's family member Previous records obtained and reviewed in epic including recent admissions I consulted Dr. Jonnie Finner nephrology and Dr. Trilby Drummer [facility] hospitalist and discussed lab and imaging findings and discussed disposition.  Cardiac monitoring reviewed, normal sinus rhythm Social determinants considered, no significant barriers Critical Interventions: Initiation of antibiotics for pneumonia and arrangement for dialysis for hypoxia and chronic renal failure  After the interventions stated above, I reevaluated the patient and found patient still to be symptomatic dyspneic at rest and hypoxic requiring oxygen Admission and further testing considered, he would benefit from mission to the hospital for further management of his symptoms         Final Clinical Impression(s) / ED Diagnoses Final diagnoses:  Acute respiratory failure with hypoxia ([place])  Hyperglycemia  End stage renal disease ([place])    Rx / DC Orders ED Discharge Orders     None          Hayden Rasmussen, MD 11/24/22 1810

## 2022-11-24 NOTE — Progress Notes (Signed)
Unable to cannulate patient. +Bruit Faint Thrill. Unsuccessful cannulation x 2. MD Hollie Salk on unit and aware. Pt VVS, transported back to ED. Pt NPO for intervention tomorrow.

## 2022-11-24 NOTE — Progress Notes (Signed)
Seen at bedside.  AVG cannulated but no pulsatile flow.  Some bruit.    Has had repeated issues with AVG cannulation.    NPO past MN, will need VVS c/s in AM (please see last VVS note from 12/7).  Madelon Lips MD [employer] Pgr [phone]

## 2022-11-24 NOTE — ED Notes (Signed)
Pt reassessed, NAD. Pt AxOx4.  

## 2022-11-25 ENCOUNTER — Observation Stay (HOSPITAL_COMMUNITY): Payer: 59

## 2022-11-25 ENCOUNTER — Encounter (HOSPITAL_COMMUNITY)
Admission: EM | Disposition: A | Discharge status: Home / Self Care | Payer: Self-pay | Source: Home / Self Care | Attending: Internal Medicine

## 2022-11-25 DIAGNOSIS — E8770 Fluid overload, unspecified: Secondary | ICD-10-CM | POA: Diagnosis present

## 2022-11-25 DIAGNOSIS — I5033 Acute on chronic diastolic (congestive) heart failure: Secondary | ICD-10-CM | POA: Diagnosis present

## 2022-11-25 DIAGNOSIS — E1122 Type 2 diabetes mellitus with diabetic chronic kidney disease: Secondary | ICD-10-CM | POA: Diagnosis present

## 2022-11-25 DIAGNOSIS — F330 Major depressive disorder, recurrent, mild: Secondary | ICD-10-CM | POA: Diagnosis present

## 2022-11-25 DIAGNOSIS — Z6841 Body Mass Index (BMI) 40.0 and over, adult: Secondary | ICD-10-CM | POA: Diagnosis not present

## 2022-11-25 DIAGNOSIS — E1165 Type 2 diabetes mellitus with hyperglycemia: Secondary | ICD-10-CM | POA: Diagnosis present

## 2022-11-25 DIAGNOSIS — I959 Hypotension, unspecified: Secondary | ICD-10-CM | POA: Diagnosis not present

## 2022-11-25 DIAGNOSIS — Z79899 Other long term (current) drug therapy: Secondary | ICD-10-CM | POA: Diagnosis not present

## 2022-11-25 DIAGNOSIS — J9601 Acute respiratory failure with hypoxia: Secondary | ICD-10-CM

## 2022-11-25 DIAGNOSIS — D509 Iron deficiency anemia, unspecified: Secondary | ICD-10-CM | POA: Diagnosis present

## 2022-11-25 DIAGNOSIS — R739 Hyperglycemia, unspecified: Secondary | ICD-10-CM | POA: Diagnosis not present

## 2022-11-25 DIAGNOSIS — Z992 Dependence on renal dialysis: Secondary | ICD-10-CM | POA: Diagnosis not present

## 2022-11-25 DIAGNOSIS — I132 Hypertensive heart and chronic kidney disease with heart failure and with stage 5 chronic kidney disease, or end stage renal disease: Secondary | ICD-10-CM | POA: Diagnosis present

## 2022-11-25 DIAGNOSIS — I272 Pulmonary hypertension, unspecified: Secondary | ICD-10-CM | POA: Diagnosis present

## 2022-11-25 DIAGNOSIS — J449 Chronic obstructive pulmonary disease, unspecified: Secondary | ICD-10-CM | POA: Diagnosis present

## 2022-11-25 DIAGNOSIS — D631 Anemia in chronic kidney disease: Secondary | ICD-10-CM | POA: Diagnosis present

## 2022-11-25 DIAGNOSIS — E1169 Type 2 diabetes mellitus with other specified complication: Secondary | ICD-10-CM | POA: Diagnosis present

## 2022-11-25 DIAGNOSIS — Z1152 Encounter for screening for COVID-19: Secondary | ICD-10-CM | POA: Diagnosis not present

## 2022-11-25 DIAGNOSIS — N186 End stage renal disease: Secondary | ICD-10-CM | POA: Diagnosis present

## 2022-11-25 DIAGNOSIS — I5032 Chronic diastolic (congestive) heart failure: Secondary | ICD-10-CM | POA: Diagnosis not present

## 2022-11-25 DIAGNOSIS — E8779 Other fluid overload: Secondary | ICD-10-CM | POA: Diagnosis not present

## 2022-11-25 DIAGNOSIS — Z9981 Dependence on supplemental oxygen: Secondary | ICD-10-CM | POA: Diagnosis not present

## 2022-11-25 DIAGNOSIS — E782 Mixed hyperlipidemia: Secondary | ICD-10-CM | POA: Diagnosis present

## 2022-11-25 DIAGNOSIS — J9621 Acute and chronic respiratory failure with hypoxia: Secondary | ICD-10-CM | POA: Diagnosis present

## 2022-11-25 DIAGNOSIS — J9811 Atelectasis: Secondary | ICD-10-CM | POA: Diagnosis present

## 2022-11-25 DIAGNOSIS — Z87891 Personal history of nicotine dependence: Secondary | ICD-10-CM | POA: Diagnosis not present

## 2022-11-25 HISTORY — PX: INSERTION OF DIALYSIS CATHETER: SHX1324

## 2022-11-25 LAB — BLOOD GAS, ARTERIAL
Acid-Base Excess: 3 mmol/L — ABNORMAL HIGH (ref 0.0–2.0)
Bicarbonate: 32.3 mmol/L — ABNORMAL HIGH (ref 20.0–28.0)
O2 Saturation: 85.7 %
Patient temperature: 37.1
pCO2 arterial: 72 mmHg (ref 32–48)
pH, Arterial: 7.26 — ABNORMAL LOW (ref 7.35–7.45)
pO2, Arterial: 55 mmHg — ABNORMAL LOW (ref 83–108)

## 2022-11-25 LAB — RENAL FUNCTION PANEL
Albumin: 3 g/dL — ABNORMAL LOW (ref 3.5–5.0)
Albumin: 3 g/dL — ABNORMAL LOW (ref 3.5–5.0)
Anion gap: 14 (ref 5–15)
Anion gap: 13 (ref 5–15)
BUN: 120 mg/dL — ABNORMAL HIGH (ref 8–23)
BUN: 123 mg/dL — ABNORMAL HIGH (ref 8–23)
CO2: 28 mmol/L (ref 22–32)
CO2: 31 mmol/L (ref 22–32)
Calcium: 7.2 mg/dL — ABNORMAL LOW (ref 8.9–10.3)
Calcium: 7.3 mg/dL — ABNORMAL LOW (ref 8.9–10.3)
Chloride: 91 mmol/L — ABNORMAL LOW (ref 98–111)
Chloride: 96 mmol/L — ABNORMAL LOW (ref 98–111)
Creatinine, Ser: 5.52 mg/dL — ABNORMAL HIGH (ref 0.61–1.24)
Creatinine, Ser: 5.8 mg/dL — ABNORMAL HIGH (ref 0.61–1.24)
GFR, Estimated: 11 mL/min — ABNORMAL LOW (ref >60)
GFR, Estimated: 10 mL/min — ABNORMAL LOW (ref >60)
Glucose, Bld: 504 mg/dL (ref 70–99)
Glucose, Bld: 121 mg/dL — ABNORMAL HIGH (ref 70–99)
Phosphorus: 5.5 mg/dL — ABNORMAL HIGH (ref 2.5–4.6)
Phosphorus: 5.6 mg/dL — ABNORMAL HIGH (ref 2.5–4.6)
Potassium: 4.5 mmol/L (ref 3.5–5.1)
Potassium: 4 mmol/L (ref 3.5–5.1)
Sodium: 133 mmol/L — ABNORMAL LOW (ref 135–145)
Sodium: 140 mmol/L (ref 135–145)

## 2022-11-25 LAB — HIV ANTIBODY (ROUTINE TESTING W REFLEX): HIV Screen 4th Generation wRfx: NONREACTIVE

## 2022-11-25 LAB — GLUCOSE, CAPILLARY
Glucose-Capillary: 370 mg/dL — ABNORMAL HIGH (ref 70–99)
Glucose-Capillary: 272 mg/dL — ABNORMAL HIGH (ref 70–99)
Glucose-Capillary: 66 mg/dL — ABNORMAL LOW (ref 70–99)

## 2022-11-25 LAB — CBC
HCT: 28.6 % — ABNORMAL LOW (ref 39.0–52.0)
Hemoglobin: 9.1 g/dL — ABNORMAL LOW (ref 13.0–17.0)
MCH: 28.4 pg (ref 26.0–34.0)
MCHC: 31.8 g/dL (ref 30.0–36.0)
MCV: 89.4 fL (ref 80.0–100.0)
Platelets: 126 10*3/uL — ABNORMAL LOW (ref 150–400)
RBC: 3.2 MIL/uL — ABNORMAL LOW (ref 4.22–5.81)
RDW: 14.4 % (ref 11.5–15.5)
WBC: 8 10*3/uL (ref 4.0–10.5)
nRBC: 0 % (ref 0.0–0.2)

## 2022-11-25 LAB — HEPATITIS B SURFACE ANTIGEN: Hepatitis B Surface Ag: NONREACTIVE

## 2022-11-25 SURGERY — INSERTION OF DIALYSIS CATHETER
Anesthesia: LOCAL

## 2022-11-25 MED ORDER — HEPARIN SODIUM (PORCINE) 1000 UNIT/ML IJ SOLN
INTRAMUSCULAR | Status: AC
  Filled 2022-11-25: qty 10

## 2022-11-25 MED ORDER — HEPARIN SODIUM (PORCINE) 1000 UNIT/ML IJ SOLN
INTRAMUSCULAR | Status: AC
  Administered 2022-11-25: 3000 [IU]
  Filled 2022-11-25: qty 3

## 2022-11-25 MED ORDER — ALBUMIN HUMAN 25 % IV SOLN: 50.0000 g | Freq: Once | INTRAVENOUS | Status: AC

## 2022-11-25 MED ORDER — HEPARIN SODIUM (PORCINE) 1000 UNIT/ML IJ SOLN
INTRAMUSCULAR | Status: AC
  Administered 2022-11-25: 2400 [IU]
  Filled 2022-11-25: qty 3

## 2022-11-25 MED ORDER — ALBUMIN HUMAN 25 % IV SOLN
INTRAVENOUS | Status: AC
  Administered 2022-11-25: 25 g
  Filled 2022-11-25: qty 100

## 2022-11-25 MED ORDER — MIDODRINE HCL 5 MG PO TABS
10.0000 mg | ORAL_TABLET | Freq: Once | ORAL | Status: AC
  Administered 2022-11-25: 10 mg via ORAL

## 2022-11-25 MED ORDER — CHLORHEXIDINE GLUCONATE CLOTH 2 % EX PADS
6.0000 | MEDICATED_PAD | Freq: Every day | CUTANEOUS | Status: DC
  Administered 2022-11-26 – 2022-11-28 (×3): 6 via TOPICAL

## 2022-11-25 MED ORDER — MIDODRINE HCL 5 MG PO TABS
ORAL_TABLET | ORAL | Status: AC
  Filled 2022-11-25: qty 2

## 2022-11-25 MED ORDER — AMLODIPINE BESYLATE 5 MG PO TABS
5.0000 mg | ORAL_TABLET | Freq: Two times a day (BID) | ORAL | Status: DC
  Filled 2022-11-25: qty 1

## 2022-11-25 MED ORDER — HEPARIN SODIUM (PORCINE) 1000 UNIT/ML DIALYSIS: 3000.0000 [IU] | INTRAMUSCULAR | Status: DC | PRN

## 2022-11-25 NOTE — Consult Note (Addendum)
Hospital Consult    Reason for Consult:  nonfunctioning AVG  Requesting Physician:  Eulogio Bear DO MRN #:  [id number]  History of Present Illness: Jeffrey [name] is a 61 y.o. male with a PMH of ESRD, DM2, DDD, COPD, pulmonary hypertension, and CHF who presented to the hospital on 11/23/2022 with SOB and volume overload. He did not have dialysis for the past week.   On exam yesterday his AVG had a bruit but would not function for HD. We were consulted to assess the patency of his R arm AVG.  Of note, he has a history of multiple previous accesses for dialysis. He has a history of left arm fistula that was ligated for steal. He also had a right axillary loop graft at [place] that failed in 2022. His current AVG is a right brachial-axillary vein graft placed by Dr.Brabham on 08/12/2021. Since placement of this graft, he's had several recurrences of central stenosis causing RUE swelling and poor graft function. He has required right innominate vein and right subclavian vein DCBA and stenting multiple times, most recently on 10/31/2022 by Dr.Cain. His AVG was functioning the past couple of weeks until now.  The pt is on a statin for cholesterol management.  The pt is on a daily aspirin.   Other AC:  plavix The pt is on amlodipine for hypertension.   The pt is diabetic.    Past Medical History:  Diagnosis Date   Altered mental status 05/04/2021   Arthritis    Blood transfusion without reported diagnosis    CHF (congestive heart failure) (HCC)    COPD (chronic obstructive pulmonary disease) ([place])    Diabetes mellitus without complication ([place])    ESRD on hemodialysis ([place])    Hypertension    PTSD (post-traumatic stress disorder)    Sepsis ([place])     Past Surgical History:  Procedure Laterality Date   A/V FISTULAGRAM N/A 08/30/2021   Procedure: A/V FISTULAGRAM;  Surgeon: Waynetta Sandy, MD;  Location: [facility];  Service: Cardiovascular;  Laterality: N/A;   A/V FISTULAGRAM  Right 07/28/2022   Procedure: A/V Fistulagram;  Surgeon: Waynetta Sandy, MD;  Location: [facility];  Service: Cardiovascular;  Laterality: Right;   A/V FISTULAGRAM Right 09/05/2022   Procedure: A/V Fistulagram;  Surgeon: Waynetta Sandy, MD;  Location: [facility];  Service: Cardiovascular;  Laterality: Right;   A/V FISTULAGRAM Right 10/31/2022   Procedure: A/V Fistulagram;  Surgeon: Waynetta Sandy, MD;  Location: [facility];  Service: Cardiovascular;  Laterality: Right;   A/V SHUNT INTERVENTION Right 10/31/2022   Procedure: A/V SHUNT INTERVENTION;  Surgeon: Waynetta Sandy, MD;  Location: [facility];  Service: Cardiovascular;  Laterality: Right;   A/V SHUNTOGRAM N/A 11/08/2021   Procedure: A/V SHUNTOGRAM;  Surgeon: Waynetta Sandy, MD;  Location: [facility];  Service: Cardiovascular;  Laterality: N/A;   AMPUTATION Left 04/03/2021   Procedure: LEFT ABOVE-THE-KNEE AMPUTATION;  Surgeon: Erle Crocker, MD;  Location: [facility];  Service: Orthopedics;  Laterality: Left;   AV FISTULA PLACEMENT Right 08/12/2021   Procedure: INSERTION OF ARTERIOVENOUS (AV) GORE-TEX GRAFT RIGHT ARM;  Surgeon: Serafina Mitchell, MD;  Location: [facility];  Service: Vascular;  Laterality: Right;   IR REMOVAL TUN CV CATH W/O FL  03/30/2021   JOINT REPLACEMENT     Bilateral knees   PERIPHERAL VASCULAR BALLOON ANGIOPLASTY Right 08/30/2021   Procedure: PERIPHERAL VASCULAR BALLOON ANGIOPLASTY;  Surgeon: Servando Snare  Harrell Gave, MD;  Location: [facility];  Service: Cardiovascular;  Laterality: Right;   PERIPHERAL VASCULAR BALLOON ANGIOPLASTY Right 07/28/2022   Procedure: PERIPHERAL VASCULAR BALLOON ANGIOPLASTY;  Surgeon: Waynetta Sandy, MD;  Location: [facility];  Service: Cardiovascular;  Laterality: Right;  AVF   PERIPHERAL VASCULAR INTERVENTION  11/08/2021   Procedure: PERIPHERAL VASCULAR INTERVENTION;  Surgeon: Waynetta Sandy, MD;  Location: [facility];  Service: Cardiovascular;;  Central rt arm fistula   UPPER EXTREMITY VENOGRAPHY Right 08/10/2021   Procedure: UPPER EXTREMITY VENOGRAPHY;  Surgeon: Serafina Mitchell, MD;  Location: [facility];  Service: Cardiovascular;  Laterality: Right;    Allergies  Allergen Reactions   Actos [Pioglitazone] Anaphylaxis and Rash   Dexmedetomidine Nausea And Vomiting    (Precedex) Dose-limiting bradycardia     Ibuprofen Shortness Of Breath and Swelling    Pt tolerates aspirin    Tomato Anaphylaxis    Only allergic to RAW tomatoes   Wellbutrin [Bupropion] Swelling    Prior to Admission medications   Medication Sig Start Date End Date Taking? Authorizing Provider  ACCU-CHEK GUIDE test strip 4 (four) times daily. 11/15/20   [provider]  acetaminophen (TYLENOL) 325 MG tablet Take 2 tablets (650 mg total) by mouth every 6 (six) hours as needed for moderate pain. 04/14/21   Raiford Noble Latif, DO  albuterol (PROVENTIL) (2.5 MG/3ML) 0.083% nebulizer solution Inhale 2.5 mg into the lungs every 8 (eight) hours as needed for wheezing or shortness of breath.    [provider]  albuterol (VENTOLIN HFA) 108 (90 Base) MCG/ACT inhaler Inhale 1-2 puffs into the lungs every 6 (six) hours as needed for wheezing or shortness of breath.    [provider]  amLODipine (NORVASC) 10 MG tablet Take 10 mg by mouth every morning. 04/17/20   [provider]  Ascorbic Acid (VITAMIN C PO) Take 1 tablet by mouth every Monday, Wednesday, and Friday.    [provider]  ASPIRIN LOW DOSE 81 MG EC tablet Take 81 mg by mouth in the morning. 02/22/21   [provider]  atorvastatin (LIPITOR) 40 MG tablet Take 40 mg by mouth in the morning. 02/09/21   [provider]  calcium acetate (PHOSLO) 667 MG capsule Take 2 capsules (1,334 mg total) by mouth with breakfast, with lunch, and with evening meal. Patient  taking differently: Take 1,334 mg by mouth 2 (two) times daily with a meal. 04/10/21   Debbe Odea, MD  carvedilol (COREG) 12.5 MG tablet Take 12.5 mg by mouth 2 (two) times daily with a meal. 04/17/20   [provider]  clopidogrel (PLAVIX) 75 MG tablet Take 1 tablet (75 mg total) by mouth daily. 11/02/22   Rhyne, Hulen Shouts, PA-C  Continuous Blood Gluc Receiver (DEXCOM G6 RECEIVER) DEVI Use 1 Device continuously 03/08/21   [provider]  cyclobenzaprine (FLEXERIL) 10 MG tablet Take 1 tablet (10 mg total) by mouth 2 (two) times daily as needed for muscle spasms. 12/25/21   Curatolo, Adam, DO  diazepam (VALIUM) 5 MG tablet Take 5 mg by mouth in the morning and at bedtime. 12/09/21   [provider]  diphenhydrAMINE (BENADRYL) 25 MG tablet Take 50 mg by mouth every 6 (six) hours as needed for itching.    [provider]  insulin regular human CONCENTRATED (HUMULIN R U-500 KWIKPEN) 500 UNIT/ML KwikPen Inject 45 Units into the skin daily with supper. Patient taking differently: Inject 125-150 Units into  the skin 2 (two) times daily with a meal. Sliding scale 09/04/21 08/30/24  Bonnell Public, MD  Methoxy PEG-Epoetin Beta (MIRCERA IJ) Mircera 08/22/22 08/21/23  [provider]  metolazone (ZAROXOLYN) 10 MG tablet Take 20 mg by mouth daily. 07/13/21   [provider]  nortriptyline (PAMELOR) 10 MG capsule Take 20 mg by mouth at bedtime. 10/23/19 08/30/24  [provider]  pantoprazole (PROTONIX) 40 MG tablet Take 40 mg by mouth daily before breakfast. 02/09/21   [provider]  polyethylene glycol powder (GLYCOLAX/MIRALAX) 17 GM/SCOOP powder Take 17 g by mouth daily as needed for mild constipation. 08/24/20   [provider]  pregabalin (LYRICA) 75 MG capsule Take 75 mg by mouth in the morning. 08/22/22   [provider]  senna-docusate (SENOKOT-S) 8.6-50 MG tablet Take 2 tablets by mouth 2 (two) times daily as needed for  mild constipation. 10/23/19   [provider]  sertraline (ZOLOFT) 100 MG tablet Take 100 mg by mouth in the morning. 02/22/21   [provider]  tamsulosin (FLOMAX) 0.4 MG CAPS capsule Take 1 capsule (0.4 mg total) by mouth daily. 11/10/21   Darliss Cheney, MD  torsemide (DEMADEX) 20 MG tablet Take 100 mg by mouth 2 (two) times daily.    [provider]  traZODone (DESYREL) 150 MG tablet Take 150 mg by mouth at bedtime as needed for sleep. 12/09/21   [provider]    Social History   Socioeconomic History   Marital status: Married    Spouse name: Not on file   Number of children: Not on file   Years of education: Not on file   Highest education level: Not on file  Occupational History   Occupation: disabled  Tobacco Use   Smoking status: Former    Types: Cigarettes    Quit date: 2012    Years since quitting: 12.0   Smokeless tobacco: Never  Vaping Use   Vaping Use: Never used  Substance and Sexual Activity   Alcohol use: Yes    Comment: occasionally   Drug use: Not Currently    Comment: Years ago   Sexual activity: Not on file  Other Topics Concern   Not on file  Social History Narrative   Not on file   Social Determinants of Health   Financial Resource Strain: Not on file  Food Insecurity: No Food Insecurity (11/24/2022)   Hunger Vital Sign    Worried About Running Out of Food in the Last Year: Never true    Ran Out of Food in the Last Year: Never true  Transportation Needs: No Transportation Needs (11/24/2022)   PRAPARE - Hydrologist (Medical): No    Lack of Transportation (Non-Medical): No  Physical Activity: Not on file  Stress: Not on file  Social Connections: Not on file  Intimate Partner Violence: Not At Risk (11/24/2022)   Humiliation, Afraid, Rape, and Kick questionnaire    Fear of Current or Ex-Partner: No    Emotionally Abused: No    Physically Abused: No    Sexually Abused: No     History reviewed. No pertinent family history.  ROS: [x]  Positive   [ ]  Negative   [ ]  All sytems reviewed and are negative  Cardiac: []  chest pain/pressure []  hx MI [x]  SOB   Vascular: []  pain in legs while walking []  pain in legs at rest []  pain in legs at night []  non-healing ulcers []  hx of DVT [  x] swelling in legs  Pulmonary: []  asthma/wheezing []  home O2  Neurologic: []  hx of CVA []  mini stroke   Hematologic: []  hx of cancer  Endocrine:   [x]  diabetes []  thyroid disease  GI []  GERD  GU: []  CKD/renal failure [x]  HD--[]  M/W/F or []  T/T/S  Psychiatric: []  anxiety []  depression  Musculoskeletal: []  arthritis []  joint pain  Integumentary: []  rashes []  ulcers  Constitutional: []  fever  []  chills  Physical Examination  Vitals:   11/25/22 0726 11/25/22 0843  BP:  129/61  Pulse: 95 89  Resp: 18 16  Temp:  98.8 F (37.1 C)  SpO2: 92% 98%   There is no height or weight on file to calculate BMI.  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 RLE edematous. R arm AVG graft without pulsatility or palpable thrill, has soft bruit on auscultation Musculoskeletal: no muscle wasting or atrophy  Neurologic: A&O X 3 Psychiatric:  The pt has Normal affect.   CBC    Component Value Date/Time   WBC 8.0 11/25/2022 0218   RBC 3.20 (L) 11/25/2022 0218   HGB 9.1 (L) 11/25/2022 0218   HCT 28.6 (L) 11/25/2022 0218   PLT 126 (L) 11/25/2022 0218   MCV 89.4 11/25/2022 0218   MCH 28.4 11/25/2022 0218   MCHC 31.8 11/25/2022 0218   RDW 14.4 11/25/2022 0218   LYMPHSABS 0.3 (L) 11/24/2022 1414   MONOABS 0.5 11/24/2022 1414   EOSABS 0.1 11/24/2022 1414    BASOSABS 0.0 11/24/2022 1414    BMET    Component Value Date/Time   NA 133 (L) 11/25/2022 0218   K 4.5 11/25/2022 0218   CL 91 (L) 11/25/2022 0218   CO2 28 11/25/2022 0218   GLUCOSE 504 (HH) 11/25/2022 0218   BUN 120 (H) 11/25/2022 0218   CREATININE 5.52 (H) 11/25/2022 0218   CALCIUM 7.2 (L) 11/25/2022 0218   GFRNONAA 11 (L) 11/25/2022 0218    COAGS: Lab Results  Component Value Date   INR 1.1 10/19/2022   INR 1.2 03/28/2021     ASSESSMENT/PLAN: This is a 61 y.o. male with volume overload and poorly functioning R arm AVG  -On exam today the patient's AVG has a soft bruit throughout, however it does not have a palpable thrill and there is no pulsatility -After his most recent fistulogram on 10/31/2022, it was noted that we would need to consider alternative HD access for the patient if he continued to have issues with his graft -The patient will be getting a temp cath today with CCM -The patient does not want any more rescue attempts of the R arm AVG, he is ready to move onto something else. I have explained to him that his upper extremity options have been exhausted. He is aware that his next options would either be a thigh graft if he is a candidate or potentially Hero Graft. He is agreeable to this -Dr. Stanford Breed to evaluate pt and determine further plan   Vicente Serene, PA-C [facility] [phone]   VASCULAR STAFF ADDENDUM: I have independently interviewed and examined the patient. I agree with the above.  Admitted to IM service for volume overload, poorly functioning RUE AVG. AVG has been intervened upon multiple times. On  exam, AVG appears patent with palpable, but weak thrill.  Agree with using temporary catheter. Will plan to create new access next week as his clinical condition improves.  Yevonne Aline. Stanford Breed, MD [facility] Phone Number: [phone] 11/25/2022 5:30 PM

## 2022-11-25 NOTE — Progress Notes (Signed)
Pt transported on BiPAP from 5W37 to HD01 without any complications. Pt tolerated well, RN at beside, RT will monitor.

## 2022-11-25 NOTE — Progress Notes (Signed)
Transported back into his unit's room with RT.

## 2022-11-25 NOTE — Progress Notes (Signed)
PROGRESS NOTE    Jeffrey [name]  DJS:[id number] DOB: [birth year] DOA: 11/23/2022 PCP: Kristie Cowman, MD    Brief Narrative:  Jeffrey [name] is a 61 y.o. male with medical history significant of ESRD, diabetes, degenerative disc disease status post vertebral fusions, orthopedic surgeries, chronic pain, PTSD, mood disorder, depression, anxiety, OSA, anemia, COPD, CHF, BPH, pulmonary hypertension, anaphylaxis, hyperlipidemia, obesity presenting with shortness of breath, fatigue, nausea, body aches.   Patient has had gradually worsening symptoms for the past week.  Notably did not have dialysis for the past week.  Reportedly was discharged from previous dialysis center.  Now is having body aches, nausea, fatigue and shortness of breath which is gradually worsening.  Is on chronic oxygen at home for the past several weeks at 3 L.  Shortness of breath and fatigue worsens with activity improves with rest and oxygen.      Assessment and Plan: Volume overload ESRD Hypocalcemia Uremia Acute on chronic respiratory failure with hypoxia > Has not had dialysis in a week due to being discharged from his dialysis center per reports.  > Has been on chronic oxygen for at least the past several weeks at 3 L.-- now requiring BIPAP > Nephrology consulted and will arrange dialysis once patient has access-- PCCM to place temp catheter - Vascular consult for issues with AVG   Abnormal chest x-ray Fatigue and bodyaches > With patient's fatigue and bodyaches as well as questionable early pneumonia on chest x-ray patient received ceftriaxone and azithromycin in the ED. > Fatigue may also be due to volume overload.  Body aches may be viral in etiology, patient was negative for flu COVID and RSV. - Hold off on further antibiotics - Trend fever curve and WBC   Anemia > Hemoglobin near baseline at 9.6 also degree of hemodilution likely.   - Trend CBC   Diabetes - 60 units BID - SSI   Degenerative disc  disease Chronic pain > Status post multiple surgical interventions. - Continue home nortriptyline, Lyrica   PTSD Mood disorder Depression - Continue home sertraline, nortriptyline, diazepam, trazodone   OSA - CPAP   COPD > History of this listed in chart, do not see any maintenance inhalers. - Continue as needed albuterol   Acute on chronic Diastolic CHF > Listed in chart.  Last echo showed EF 88-50%, normal diastolic function, normal RV function. > Volume issues appear to be related to renal disease and missed dialysis only. - Volume management with dialysis   Hypertension - Continue home amlodipine, carvedilol - Torsemide, metolazone per renal   Hyperlipidemia - Continue home atorvastatin   Obesity Estimated body mass index is 55.54 kg/m as calculated from the following:   Height as of 10/31/22: 5' (1.524 m).   Weight as of 11/03/22: 129 kg.     DVT prophylaxis: heparin injection 5,000 Units Start: 11/24/22 1630    Code Status: Full Code   Disposition Plan:  Level of care: Progressive Status is: Observation The patient will require care spanning > 2 midnights and should be moved to inpatient because: needs HD    Consultants:  Renal PCCM (for HD access)   Subjective: Feeling SOB  Objective: Vitals:   11/25/22 0843 11/25/22 1245 11/25/22 1329 11/25/22 1330  BP: 129/61 (!) 90/54  (!) 97/51  Pulse: 89 89 87 87  Resp: 16 18 (!) 99 12  Temp: 98.8 F (37.1 C) 98.6 F (37 C)  99.2 F (37.3 C)  TempSrc: Oral Axillary  Axillary  SpO2: 98% 97%  98%    Intake/Output Summary (Last 24 hours) at 11/25/2022 1400 Last data filed at 11/25/2022 0900 Gross per 24 hour  Intake 960 ml  Output 350 ml  Net 610 ml   There were no vitals filed for this visit.  Examination:   General: Appearance:    Severely obese male in no acute distress     Lungs:     On bipap-- diminished at bases  Heart:    Normal heart rate.       Neurologic:   Awake, alert.         Data Reviewed: I have personally reviewed following labs and imaging studies  CBC: Recent Labs  Lab 11/24/22 1414 11/24/22 1422 11/25/22 0218  WBC 7.3  --  8.0  NEUTROABS 6.3  --   --   HGB 9.6* 10.2* 9.1*  HCT 30.5* 30.0* 28.6*  MCV 91.0  --  89.4  PLT 120*  --  546*   Basic Metabolic Panel: Recent Labs  Lab 11/24/22 1414 11/24/22 1422 11/25/22 0218  NA 130* 130* 133*  K 4.2 4.2 4.5  CL 90* 89* 91*  CO2 25  --  28  GLUCOSE 667* 675* 504*  BUN 119* >130* 120*  CREATININE 5.24* 5.50* 5.52*  CALCIUM 7.0*  --  7.2*  PHOS  --   --  5.5*   GFR: CrCl cannot be calculated (Unknown ideal weight.). Liver Function Tests: Recent Labs  Lab 11/24/22 1414 11/25/22 0218  AST 11*  --   ALT 15  --   ALKPHOS 68  --   BILITOT 0.5  --   PROT 7.0  --   ALBUMIN 3.1* 3.0*   No results for input(s): "LIPASE", "AMYLASE" in the last 168 hours. No results for input(s): "AMMONIA" in the last 168 hours. Coagulation Profile: No results for input(s): "INR", "PROTIME" in the last 168 hours. Cardiac Enzymes: No results for input(s): "CKTOTAL", "CKMB", "CKMBINDEX", "TROPONINI" in the last 168 hours. BNP (last 3 results) No results for input(s): "PROBNP" in the last 8760 hours. HbA1C: No results for input(s): "HGBA1C" in the last 72 hours. CBG: Recent Labs  Lab 11/24/22 1356 11/24/22 1616 11/24/22 2200 11/25/22 0752 11/25/22 1147  GLUCAP >600* 430* 411* 370* 272*   Lipid Profile: No results for input(s): "CHOL", "HDL", "LDLCALC", "TRIG", "CHOLHDL", "LDLDIRECT" in the last 72 hours. Thyroid Function Tests: No results for input(s): "TSH", "T4TOTAL", "FREET4", "T3FREE", "THYROIDAB" in the last 72 hours. Anemia Panel: No results for input(s): "VITAMINB12", "FOLATE", "FERRITIN", "TIBC", "IRON", "RETICCTPCT" in the last 72 hours. Sepsis Labs: Recent Labs  Lab 11/24/22 1414  LATICACIDVEN 1.1    Recent Results (from the past 240 hour(s))  Blood culture (routine x 2)      Status: None (Preliminary result)   Collection Time: 11/24/22 12:46 PM   Specimen: BLOOD  Result Value Ref Range Status   Specimen Description BLOOD RIGHT ANTECUBITAL  Final   Special Requests   Final    BOTTLES DRAWN AEROBIC AND ANAEROBIC Blood Culture results may not be optimal due to an inadequate volume of blood received in culture bottles   Culture   Final    NO GROWTH < 24 HOURS Performed at [facility], [place] [address], [place], [place] [address]    Report Status PENDING  Incomplete  Resp panel by RT-PCR (RSV, Flu A&B, Covid) Anterior Nasal Swab     Status: None   Collection Time: 11/24/22 12:47 PM   Specimen: Anterior Nasal Swab  Result Value Ref Range Status   SARS Coronavirus 2 by RT PCR NEGATIVE NEGATIVE Final    Comment: (NOTE) SARS-CoV-2 target nucleic acids are NOT DETECTED.  The SARS-CoV-2 RNA is generally detectable in upper respiratory specimens during the acute phase of infection. The lowest concentration of SARS-CoV-2 viral copies this assay can detect is 138 copies/mL. A negative result does not preclude SARS-Cov-2 infection and should not be used as the sole basis for treatment or other patient management decisions. A negative result may occur with  improper specimen collection/handling, submission of specimen other than nasopharyngeal swab, presence of viral mutation(s) within the areas targeted by this assay, and inadequate number of viral copies(<138 copies/mL). A negative result must be combined with clinical observations, patient history, and epidemiological information. The expected result is Negative.  Fact Sheet for Patients:  EntrepreneurPulse.com.au  Fact Sheet for Healthcare Providers:  IncredibleEmployment.be  This test is no t yet approved or cleared by the [place] FDA and  has been authorized for detection and/or diagnosis of SARS-CoV-2 by FDA under an Emergency Use Authorization (EUA). This EUA  will remain  in effect (meaning this test can be used) for the duration of the COVID-19 declaration under Section 564(b)(1) of the Act, 21 U.S.C.section 360bbb-3(b)(1), unless the authorization is terminated  or revoked sooner.       Influenza A by PCR NEGATIVE NEGATIVE Final   Influenza B by PCR NEGATIVE NEGATIVE Final    Comment: (NOTE) The Xpert Xpress SARS-CoV-2/FLU/RSV plus assay is intended as an aid in the diagnosis of influenza from Nasopharyngeal swab specimens and should not be used as a sole basis for treatment. Nasal washings and aspirates are unacceptable for Xpert Xpress SARS-CoV-2/FLU/RSV testing.  Fact Sheet for Patients: EntrepreneurPulse.com.au  Fact Sheet for Healthcare Providers: IncredibleEmployment.be  This test is not yet approved or cleared by the [place] FDA and has been authorized for detection and/or diagnosis of SARS-CoV-2 by FDA under an Emergency Use Authorization (EUA). This EUA will remain in effect (meaning this test can be used) for the duration of the COVID-19 declaration under Section 564(b)(1) of the Act, 21 U.S.C. section 360bbb-3(b)(1), unless the authorization is terminated or revoked.     Resp Syncytial Virus by PCR NEGATIVE NEGATIVE Final    Comment: (NOTE) Fact Sheet for Patients: EntrepreneurPulse.com.au  Fact Sheet for Healthcare Providers: IncredibleEmployment.be  This test is not yet approved or cleared by the [place] FDA and has been authorized for detection and/or diagnosis of SARS-CoV-2 by FDA under an Emergency Use Authorization (EUA). This EUA will remain in effect (meaning this test can be used) for the duration of the COVID-19 declaration under Section 564(b)(1) of the Act, 21 U.S.C. section 360bbb-3(b)(1), unless the authorization is terminated or revoked.  Performed at [facility], [place] [address], [place], [place] [address]           Radiology Studies: DG Chest Port 1 View  Result Date: 11/25/2022 CLINICAL DATA:  Respiratory complication; history CHF, COPD, diabetes mellitus, hypertension, end-stage renal disease EXAM: PORTABLE CHEST 1 VIEW COMPARISON:  Portable exam 0730 hours compared to 11/22/2022 FINDINGS: Enlargement of cardiac silhouette with pulmonary vascular congestion. Patchy BILATERAL pulmonary infiltrates which could represent pulmonary edema or multifocal infection. Slightly prominent mediastinal contours with stent identified at RIGHT superior mediastinum. No pleural effusion or pneumothorax. Prior cervical spine fusion and RIGHT shoulder arthroplasty. IMPRESSION: Enlargement of cardiac silhouette with pulmonary vascular congestion. Patchy BILATERAL pulmonary infiltrates question edema versus multifocal pneumonia. Electronically Signed  By: Lavonia Dana M.D.   On: 11/25/2022 07:46   DG Chest 1 View  Result Date: 11/23/2022 CLINICAL DATA:  Chest pain and shortness of breath EXAM: CHEST  1 VIEW COMPARISON:  Multiple exams, including 10/19/2022 and CT scan 10/20/2022 FINDINGS: Continued fullness of the right hilum, although this was shown to be vascular on the CT scan from 10/20/2022. Hazy opacity in the left mid lung is indistinct and does not silhouette the cardiac border, accordingly more likely in the left lower lobe. This could reflect atelectasis or early pneumonia. Right subclavian stent. Thoracic spondylosis. Reverse right shoulder arthroplasty. Cervical fixators noted. Indistinct basilar opacities in the retrocardiac position and medial right lung base favoring subsegmental atelectasis. IMPRESSION: 1. Hazy opacity in the left mid lung, most likely in the left lower lobe, potentially from atelectasis or early pneumonia. 2. Mild bibasilar atelectasis. 3. Right subclavian stent. 4. Thoracic spondylosis. 5. Reverse right shoulder arthroplasty. Electronically Signed   By: Van Clines M.D.   On:  11/23/2022 20:32        Scheduled Meds:  amLODipine  5 mg Oral BID   aspirin EC  81 mg Oral q AM   atorvastatin  40 mg Oral q AM   calcium acetate  1,334 mg Oral BID WC   carvedilol  12.5 mg Oral BID WC   Chlorhexidine Gluconate Cloth  6 each Topical Q0600   Chlorhexidine Gluconate Cloth  6 each Topical Q0600   clopidogrel  75 mg Oral Daily   heparin  5,000 Units Subcutaneous Q8H   insulin aspart  0-20 Units Subcutaneous TID WC   insulin aspart  0-5 Units Subcutaneous QHS   insulin aspart  10 Units Intravenous Once   insulin glargine-yfgn  60 Units Subcutaneous BID   metolazone  20 mg Oral Daily   nortriptyline  20 mg Oral QHS   pantoprazole  40 mg Oral QAC breakfast   pregabalin  75 mg Oral q AM   sertraline  100 mg Oral q AM   sodium chloride flush  3 mL Intravenous Q12H   tamsulosin  0.4 mg Oral Daily   torsemide  100 mg Oral BID   Continuous Infusions:   LOS: 0 days    Time spent: 35 minutes spent on chart review, discussion with nursing staff, consultants, updating family and interview/physical exam; more than 50% of that time was spent in counseling and/or coordination of care.    Geradine Girt, DO [facility] Available via Epic secure chat 7am-7pm After these hours, please refer to coverage provider listed on [email or web address] 11/25/2022, 2:00 PM

## 2022-11-25 NOTE — Progress Notes (Signed)
CBG 411 @ HS finger stick. Zebedee Iba, MD on-call notified per sliding scale indications. One time dose 6u SQ short acting insulin admin at 2343 at left lower abdomen.

## 2022-11-25 NOTE — Progress Notes (Signed)
   11/25/22 0726  BiPAP/CPAP/SIPAP  BiPAP/CPAP/SIPAP Pt Type Adult  Mask Type Full face mask  Mask Size Medium  Respiratory Rate 15 breaths/min  IPAP 18 cmH20  EPAP 8 cmH2O  Flow Rate 15 lpm   switched Pt from CPAP to BiPAP on above settings. Pt states breathing is better and easier. RT will continue to monitor as needed.

## 2022-11-25 NOTE — Progress Notes (Signed)
Received patient earlier in bed ,transported by his nurse and R.T.Patient is on Bipap.He opened his eyes and speaks on verbal stimuli.Denies pain.Blood pressure on the soft side.  Access used :Newly inserted left HD catheter that works well during treatment.  Duration of treatment: 3 hours and 15 minutes as ordered.  Medicines given. Midodrine 10 mg p.o.                             Albumin 25 g                             Heparin 3,000 units.  Fluid removed : 1,000.  Hemodialysis issue. Persistent low blood pressure inspite of Albumin and Mitodrine. Was able to pulled 700 cc during the last 20 minutes of his treatment,possibly late effect of the medicines given to him.  Hnads off to the patient's nurse.

## 2022-11-25 NOTE — Progress Notes (Addendum)
       CROSS COVER NOTE  NAME: Jeffrey [name] MRN: [id number] DOB : December 21, 1960    Date of Service   11/25/2022   HPI/Events of Note   0640- Notified by bedside RN of patient with SpO2 mid 80% on 6 L HFNC.  Patient complaining of SOB despite breathing treatment.  He is currently on 15 L HFNC.  Patient appears to be overdue for HD treatment.  Acute respiratory change likely related to fluid overload.  Rapid response RN currently at bedside and reports that patient is back on CPAP with 15 L O2.  Despite acute oxygen change, no respiratory distress noted. Patient is reported to be alert and oriented and has diminished breath sounds with crackles in bilateral bases, BLE edema, frothy pink sputum.  EKG reads NSR.  Chest x-ray ABG   Interventions/ Plan   Above       Raenette Rover, DNP, [employer]

## 2022-11-25 NOTE — Progress Notes (Signed)
Transported pt. From dialysis to 5W via bipap  With no incident

## 2022-11-25 NOTE — Inpatient Diabetes Management (Signed)
Inpatient Diabetes Program Recommendations  AACE/ADA: New Consensus Statement on Inpatient Glycemic Control (2015)  Target Ranges:  Prepandial:   less than 140 mg/dL      Peak postprandial:   less than 180 mg/dL (1-2 hours)      Critically ill patients:  140 - 180 mg/dL   Lab Results  Component Value Date   GLUCAP 370 (H) 11/25/2022   HGBA1C 7.1 (H) 11/08/2021    Review of Glycemic Control  Latest Reference Range & Units 11/24/22 16:16 11/24/22 22:00 11/25/22 07:52  Glucose-Capillary 70 - 99 mg/dL 430 (H) 411 (H) 370 (H)  (H): Data is abnormally high  Diabetes history: DM2 Outpatient Diabetes medications: U500 125 QAM, 150 QPM Current orders for Inpatient glycemic control:  Semglee 60 units BID, Novolog 0-20 units TID and 0-5 QHS  Inpatient Diabetes Program Recommendations:    Please consider:  Novolog 0-20 units Q4H-NPO  Will continue to follow while inpatient.  Thank you, Reche Dixon, MSN, [place] Diabetes Coordinator Inpatient Diabetes Program [phone] (team pager from 8a-5p)

## 2022-11-25 NOTE — Progress Notes (Signed)
Aware of pt's admission. Pt has been d/c from out-pt HD clinic due to behaviors. Contacted clinic who has been working on trying to get pt accepted at another local clinic for an update. Navigator advised at this time that pt has ben denied by multiple clinics. Requested clinic to contact any clinics that are still reviewing pt's case to see if they need any documentation from the hospital and to see if any clinics would be an option for pt at d/c. [place] outside of the county is a barrier per clinic CSW. Will assist as needed.   Melven Sartorius Renal Navigator [phone]

## 2022-11-25 NOTE — Significant Event (Signed)
Rapid Response Event Note   Reason for Call :  Pt took CPAP off and SpO2-83% on 6L HFNC.   Initial Focused Assessment:  Pt lying in bed with eyes open. He is alert and oriented, c/o CP/SOB. Lungs diminished t/o with some crackles in the bases. BLE edema present. ABD large, soft. Skin warm and dry.   Prior to my arrival, pt allowed RN to placed him back on CPAP.  HR-94, BP-122/60, RR-18, SpO2-86-93% on CPAP with 15L bled in  Last HD tx 2 weeks ago. Attempt made to dialyze yesterday but AVG not working. Plan was for VVS c/s this AM. Interventions:  EKG Plan of Care:  Day shift MD to round on pt.   Event Summary:   MD Notified: Nino Glow NP Call [place] AWNO:[id number]  Dillard Essex, RN

## 2022-11-25 NOTE — Progress Notes (Signed)
[facility] Progress Note  Subjective: pt's AVG wouldn't work for HD overnight, now pt is on bipap but stable. Pt seen in room, no c/o's.   Vitals:   11/25/22 0640 11/25/22 0652 11/25/22 0726 11/25/22 0843  BP:  122/60  129/61  Pulse:   95 89  Resp:   18 16  Temp:    98.8 F (37.1 C)  TempSrc:    Oral  SpO2: (!) 87% 94% 92% 98%    Exam: Gen alert, no distress, bipap in place Morbid obesity No jvd or bruits Chest occ basilar rales bilat, no ^wob RRR no MRG Abd soft ntnd no mass or ascites +bs Ext diffuse 2+ bilat pitting LE edema Neuro is alert, Ox 3 , nf    RUA AVG +bruit      Home meds include - amlodipine 10, asa, lipitor, phoslo 2 ac tid, coreg 12.5 bid, plavix, valium, insulin regular, zaroxolyn 20 qd, pamelor, protonix, lyrica, sertraline, flomax, demadex, trazodone, prns/ vits/ supps        OP HD: most recent orders >   3h 23min   129kg      CXR - L infiltrate vs atx, no edema   Assessment/ Plan: SOB/ vol overload - marked LE edema due to reasons noted below and now on bipap as well. Pt was released/ discharged from his OP HD unit 12/15 due to behavior issues. Last HD 2 wks ago. AVG is not functional so didn't get dialysis last night. Today is on bipap. Will need temp cath for HD.  CCM is evaluating, appreciate assistance.  ESRD - released from OP HD unit recently as above. HD as above.  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  MBD ckd - CCa and phos in range, cont phoslo 2 ac as binder.  PTSD/ hx psychosis    Kelly Splinter 11/25/2022, 9:50 AM   Recent Labs  Lab 11/24/22 1414 11/24/22 1422 11/25/22 0218  HGB 9.6* 10.2* 9.1*  ALBUMIN 3.1*  --  3.0*  CALCIUM 7.0*  --  7.2*  PHOS  --   --  5.5*  CREATININE 5.24* 5.50* 5.52*  K 4.2 4.2 4.5   No results for input(s): "IRON", "TIBC", "FERRITIN" in the last 168 hours. Inpatient medications:  amLODipine  10 mg Oral q morning   aspirin EC  81 mg Oral q AM    atorvastatin  40 mg Oral q AM   calcium acetate  1,334 mg Oral BID WC   carvedilol  12.5 mg Oral BID WC   Chlorhexidine Gluconate Cloth  6 each Topical Q0600   clopidogrel  75 mg Oral Daily   heparin  5,000 Units Subcutaneous Q8H   insulin aspart  0-20 Units Subcutaneous TID WC   insulin aspart  0-5 Units Subcutaneous QHS   insulin aspart  10 Units Intravenous Once   insulin glargine-yfgn  60 Units Subcutaneous BID   metolazone  20 mg Oral Daily   nortriptyline  20 mg Oral QHS   pantoprazole  40 mg Oral QAC breakfast   pregabalin  75 mg Oral q AM   sertraline  100 mg Oral q AM   sodium chloride flush  3 mL Intravenous Q12H   tamsulosin  0.4 mg Oral Daily   torsemide  100 mg Oral BID    acetaminophen **OR** acetaminophen, albuterol, diazepam, polyethylene glycol, traZODone

## 2022-11-25 NOTE — Op Note (Signed)
Central Venous Catheter Insertion Procedure Note  Jeffrey [name]  [id number]  [birth year]  Date:11/25/22  Time:2:25 PM   Provider Performing:Kaileb Monsanto Loletha Grayer Tamala Julian   Procedure: Insertion of Non-tunneled Central Venous Catheter(36556)with US guidance (50722)    Indication(s) Hemodialysis  Consent Risks of the procedure as well as the alternatives and risks of each were explained to the patient and/or caregiver.  Consent for the procedure was obtained and is signed in the bedside chart  Anesthesia Topical only with 1% lidocaine   Timeout Verified patient identification, verified procedure, site/side was marked, verified correct patient position, special equipment/implants available, medications/allergies/relevant history reviewed, required imaging and test results available.  Sterile Technique Maximal sterile technique including full sterile barrier drape, hand hygiene, sterile gown, sterile gloves, mask, hair covering, sterile ultrasound probe cover (if used).  Procedure Description Area of catheter insertion was cleaned with chlorhexidine and draped in sterile fashion.   With real-time ultrasound guidance a HD catheter was placed into the left internal jugular vein.  Nonpulsatile blood flow and easy flushing noted in all ports.  The catheter was sutured in place and sterile dressing applied.  Complications/Tolerance None; patient tolerated the procedure well. Chest X-ray is ordered to verify placement for internal jugular or subclavian cannulation.  Chest x-ray is not ordered for femoral cannulation.  EBL Minimal  Specimen(s) None

## 2022-11-25 NOTE — Progress Notes (Signed)
Pulmonary critical care with request for hemodialysis catheter 1229 2023-0930   General: Morbidly obese male on CPAP arouses and follows commands HEENT: MM pink/moist full beard Short neck Neuro: Somewhat lethargic but follows commands CV: Heart sounds are regular PULM: Diminished in the bases CPAP currently on 10 cm  GI: soft, bsx4 active obese  Extremities: warm/dry,  edema 1+ right bicep AV graft currently clotted Skin: no rashes or lesions   Recent Labs  Lab 11/24/22 1414 11/24/22 1422 11/25/22 0218  NA 130* 130* 133*  K 4.2 4.2 4.5  CL 90* 89* 91*  CO2 25  --  28  BUN 119* >130* 120*  CREATININE 5.24* 5.50* 5.52*  GLUCOSE 667* 675* 504*   Recent Labs  Lab 11/24/22 1414 11/24/22 1422 11/25/22 0218  HGB 9.6* 10.2* 9.1*  HCT 30.5* 30.0* 28.6*  WBC 7.3  --  8.0  PLT 120*  --  126*    Impression plan Is scheduled for HD catheter in the endoscopy suite at 1400 hrs. today.   Richardson Landry Fe Okubo ACNP Acute Care Nurse Practitioner [place] Please consult Amion 11/25/2022, 9:31 AM

## 2022-11-25 NOTE — Progress Notes (Signed)
Rapid Response Note   Reason for Call :  Pt volume positive, in need of HD. Pt currently on BiPAP being transferred to 5W.  Current BIPAP settings 20/12 100%. Pt on CPAP earlier, now on BIPAP pending transfer to PCU.   Initial Focused Assessment:  Pt lying in bed. AO. States his breathing feels heavy from the fluid. Lung sounds with crackles throughout.    VS: T 98.42F, BP 90/54, HR 89, RR 18, SpO2 97% BiPAP 100% CBG: 272  Interventions:  No intervention from RR RN  Plan of Care:  -Transfer to PCU -Pt to get temporary HD catheter  Event Summary:  Call Time: [place] Time: 4417 End Time: [place], RN

## 2022-11-25 NOTE — Plan of Care (Signed)
  Problem: Education: Goal: Understanding of CV disease, CV risk reduction, and recovery process will improve Outcome: Not Progressing Goal: Individualized Educational Video(s) Outcome: Not Progressing   Problem: Activity: Goal: Ability to return to baseline activity level will improve Outcome: Not Progressing   Problem: Cardiovascular: Goal: Ability to achieve and maintain adequate cardiovascular perfusion will improve Outcome: Not Progressing Goal: Vascular access site(s) Level 0-1 will be maintained Outcome: Not Progressing   Problem: Health Behavior/Discharge Planning: Goal: Ability to safely manage health-related needs after discharge will improve Outcome: Not Progressing

## 2022-11-26 DIAGNOSIS — N186 End stage renal disease: Secondary | ICD-10-CM | POA: Diagnosis not present

## 2022-11-26 DIAGNOSIS — I5032 Chronic diastolic (congestive) heart failure: Secondary | ICD-10-CM | POA: Diagnosis not present

## 2022-11-26 DIAGNOSIS — E8779 Other fluid overload: Secondary | ICD-10-CM | POA: Diagnosis not present

## 2022-11-26 LAB — RENAL FUNCTION PANEL
Albumin: 3 g/dL — ABNORMAL LOW (ref 3.5–5.0)
Anion gap: 10 (ref 5–15)
BUN: 72 mg/dL — ABNORMAL HIGH (ref 8–23)
CO2: 29 mmol/L (ref 22–32)
Calcium: 7.6 mg/dL — ABNORMAL LOW (ref 8.9–10.3)
Chloride: 96 mmol/L — ABNORMAL LOW (ref 98–111)
Creatinine, Ser: 4.2 mg/dL — ABNORMAL HIGH (ref 0.61–1.24)
GFR, Estimated: 15 mL/min — ABNORMAL LOW (ref >60)
Glucose, Bld: 181 mg/dL — ABNORMAL HIGH (ref 70–99)
Phosphorus: 4.3 mg/dL (ref 2.5–4.6)
Potassium: 3.7 mmol/L (ref 3.5–5.1)
Sodium: 135 mmol/L (ref 135–145)

## 2022-11-26 LAB — CBC
HCT: 29.5 % — ABNORMAL LOW (ref 39.0–52.0)
Hemoglobin: 9.4 g/dL — ABNORMAL LOW (ref 13.0–17.0)
MCH: 29 pg (ref 26.0–34.0)
MCHC: 31.9 g/dL (ref 30.0–36.0)
MCV: 91 fL (ref 80.0–100.0)
Platelets: 127 10*3/uL — ABNORMAL LOW (ref 150–400)
RBC: 3.24 MIL/uL — ABNORMAL LOW (ref 4.22–5.81)
RDW: 14.4 % (ref 11.5–15.5)
WBC: 7 10*3/uL (ref 4.0–10.5)
nRBC: 0 % (ref 0.0–0.2)

## 2022-11-26 LAB — GLUCOSE, CAPILLARY
Glucose-Capillary: 147 mg/dL — ABNORMAL HIGH (ref 70–99)
Glucose-Capillary: 164 mg/dL — ABNORMAL HIGH (ref 70–99)
Glucose-Capillary: 157 mg/dL — ABNORMAL HIGH (ref 70–99)
Glucose-Capillary: 211 mg/dL — ABNORMAL HIGH (ref 70–99)

## 2022-11-26 LAB — HEMOGLOBIN A1C
Hgb A1c MFr Bld: 10.5 % — ABNORMAL HIGH (ref 4.8–5.6)
Mean Plasma Glucose: 255 mg/dL

## 2022-11-26 LAB — HEPATITIS B SURFACE ANTIBODY, QUANTITATIVE: Hep B S AB Quant (Post): 23.5 m[IU]/mL (ref >9.9)

## 2022-11-26 MED ORDER — HYDROMORPHONE HCL 1 MG/ML IJ SOLN
0.5000 mg | Freq: Once | INTRAMUSCULAR | Status: AC
  Administered 2022-11-26: 0.5 mg via INTRAVENOUS
  Filled 2022-11-26: qty 0.5

## 2022-11-26 MED ORDER — MIDODRINE HCL 5 MG PO TABS
10.0000 mg | ORAL_TABLET | ORAL | Status: AC | PRN
  Administered 2022-11-29: 10 mg via ORAL
  Filled 2022-11-26: qty 2

## 2022-11-26 MED ORDER — OXYCODONE HCL 5 MG PO TABS: 5.0000 mg | ORAL_TABLET | Freq: Four times a day (QID) | ORAL | Status: DC | PRN

## 2022-11-26 MED ORDER — HEPARIN SODIUM (PORCINE) 1000 UNIT/ML IJ SOLN
INTRAMUSCULAR | Status: AC
  Administered 2022-11-26: 3000 [IU]
  Filled 2022-11-26: qty 3

## 2022-11-26 MED ORDER — HEPARIN SODIUM (PORCINE) 1000 UNIT/ML IJ SOLN
INTRAMUSCULAR | Status: AC
  Filled 2022-11-26: qty 4

## 2022-11-26 NOTE — Progress Notes (Signed)
PROGRESS NOTE    [name]  HUD:[id number] DOB: 03-23-1961 DOA: 11/23/2022 PCP: Kristie Cowman, MD    Brief Narrative:  Jeffrey [name] is a 61 y.o. male with medical history significant of ESRD, diabetes, degenerative disc disease status post vertebral fusions, orthopedic surgeries, chronic pain, PTSD, mood disorder, depression, anxiety, OSA, anemia, COPD, CHF, BPH, pulmonary hypertension, anaphylaxis, hyperlipidemia, obesity presenting with shortness of breath, fatigue, nausea, body aches -all exacerbated by medical noncompliance.  Symptoms worsening over the past 7 days prior to admission after missing at least 1 week of dialysis with worsening shortness of breath, orthopnea and dyspnea with exertion.  He is on chronic oxygen at home for the past several weeks at 3 L.  Shortness of breath and fatigue worsens with activity improves with rest and oxygen. [facility] called for admission.   Assessment and Plan:  Volume overload ESRD Hypocalcemia Uremia Acute on chronic respiratory failure with hypoxia > Has not had dialysis in a week due to being discharged from his dialysis center per reports.  > Has been on chronic oxygen for at least the past several weeks at 3 L.-- initially requiring BIPAP > Trial off Bipap today - doing well - continue to wean oxygen as tolerated, resume PO meds if able to maintain sats off bipap > Nephrology consulted and will arrange dialysis once patient has access-- PCCM to place temp catheter > Vascular consult for issues with AVG - likely to attempt grafting/similar next week pending further workup/imaging.   Abnormal chest x-ray Fatigue and bodyaches > Questionable early pneumonia on chest x-ray patient received ceftriaxone and azithromycin x1 in the ED. > Likely secondary to volume overload given patient was negative for flu COVID and RSV. - Hold off on further antibiotics and follow clinically -Remains afebrile without leukocytosis   Anemia, likely chronic anemia  of chronic disease > Hemoglobin near baseline at 9.6 also degree of hemodilution likely.   - Trend CBC   Insulin-dependent diabetes, uncontrolled with hyperglycemia  -Continue sliding scale insulin, hypoglycemic protocol -Continue glargine 60 units twice daily -A1c 10.5  Degenerative disc disease Chronic pain > Status post multiple surgical interventions. - Continue home nortriptyline, Lyrica   PTSD Mood disorder Depression - Continue home sertraline, nortriptyline, diazepam, trazodone   OSA - CPAP   COPD > History of this listed in chart, do not see any maintenance inhalers. - Continue as needed albuterol   Acute on chronic Diastolic CHF > Listed in chart.  Last echo showed EF 85-88%, normal diastolic function, normal RV function. > Volume issues appear to be related to renal disease and missed dialysis only. - Volume management with dialysis - continue metolazone/torsemide per med rec   Hypertension - Continue home amlodipine, carvedilol - Torsemide, metolazone per renal   Hyperlipidemia - Continue home atorvastatin   Obesity Estimated body mass index is 58.86 kg/m as calculated from the following:   Height as of 10/31/22: 5' (1.524 m).   Weight as of this encounter: 136.7 kg.     DVT prophylaxis: heparin injection 5,000 Units Start: 11/24/22 1630    Code Status: Full Code   Disposition Plan:  Level of care: Progressive Status is: Inpt The patient will require care spanning > 2 midnights and should be moved to inpatient because: needs HD    Consultants:  Renal PCCM (for HD access)  Subjective: No acute issues or events overnight respiratory status improving denies nausea vomiting diarrhea constipation headache fevers chills or chest pain  Objective: Vitals:   11/26/22 0000  11/26/22 0400 11/26/22 0448 11/26/22 0800  BP: (!) 159/59 (!) 141/68  127/74  Pulse: 93 91 87 88  Resp: 17 14 13 19   Temp: 98.8 F (37.1 C) 98.6 F (37 C)  98.2 F (36.8 C)   TempSrc: Axillary Axillary  Axillary  SpO2: 95% 94% 97% 95%  Weight:        Intake/Output Summary (Last 24 hours) at 11/26/2022 0837 Last data filed at 11/25/2022 1928 Gross per 24 hour  Intake 0 ml  Output 1000 ml  Net -1000 ml    Filed Weights   11/25/22 1603 11/25/22 2028  Weight: (!) 137.6 kg (!) 136.7 kg    Examination:   General: Appearance:    Severely obese male in no acute distress     Lungs:     On bipap-- diminished at bases  Heart:    Normal heart rate.       Neurologic:   Awake, alert.        Data Reviewed: I have personally reviewed following labs and imaging studies  CBC: Recent Labs  Lab 11/24/22 1414 11/24/22 1422 11/25/22 0218  WBC 7.3  --  8.0  NEUTROABS 6.3  --   --   HGB 9.6* 10.2* 9.1*  HCT 30.5* 30.0* 28.6*  MCV 91.0  --  89.4  PLT 120*  --  126*    Basic Metabolic Panel: Recent Labs  Lab 11/24/22 1414 11/24/22 1422 11/25/22 0218 11/25/22 1611  NA 130* 130* 133* 140  K 4.2 4.2 4.5 4.0  CL 90* 89* 91* 96*  CO2 25  --  28 31  GLUCOSE 667* 675* 504* 121*  BUN 119* >130* 120* 123*  CREATININE 5.24* 5.50* 5.52* 5.80*  CALCIUM 7.0*  --  7.2* 7.3*  PHOS  --   --  5.5* 5.6*    GFR: Estimated Creatinine Clearance: 15.8 mL/min (A) (by C-G formula based on SCr of 5.8 mg/dL (H)). Liver Function Tests: Recent Labs  Lab 11/24/22 1414 11/25/22 0218 11/25/22 1611  AST 11*  --   --   ALT 15  --   --   ALKPHOS 68  --   --   BILITOT 0.5  --   --   PROT 7.0  --   --   ALBUMIN 3.1* 3.0* 3.0*    No results for input(s): "LIPASE", "AMYLASE" in the last 168 hours. No results for input(s): "AMMONIA" in the last 168 hours. Coagulation Profile: No results for input(s): "INR", "PROTIME" in the last 168 hours. Cardiac Enzymes: No results for input(s): "CKTOTAL", "CKMB", "CKMBINDEX", "TROPONINI" in the last 168 hours. BNP (last 3 results) No results for input(s): "PROBNP" in the last 8760 hours. HbA1C: Recent Labs     11/25/22 0218  HGBA1C 10.5*   CBG: Recent Labs  Lab 11/24/22 1616 11/24/22 2200 11/25/22 0752 11/25/22 1147 11/25/22 2244  GLUCAP 430* 411* 370* 272* 66*    Lipid Profile: No results for input(s): "CHOL", "HDL", "LDLCALC", "TRIG", "CHOLHDL", "LDLDIRECT" in the last 72 hours. Thyroid Function Tests: No results for input(s): "TSH", "T4TOTAL", "FREET4", "T3FREE", "THYROIDAB" in the last 72 hours. Anemia Panel: No results for input(s): "VITAMINB12", "FOLATE", "FERRITIN", "TIBC", "IRON", "RETICCTPCT" in the last 72 hours. Sepsis Labs: Recent Labs  Lab 11/24/22 1414  LATICACIDVEN 1.1     Recent Results (from the past 240 hour(s))  Blood culture (routine x 2)     Status: None (Preliminary result)   Collection Time: 11/24/22 12:46 PM   Specimen: BLOOD  Result Value Ref Range Status   Specimen Description BLOOD RIGHT ANTECUBITAL  Final   Special Requests   Final    BOTTLES DRAWN AEROBIC AND ANAEROBIC Blood Culture results may not be optimal due to an inadequate volume of blood received in culture bottles   Culture   Final    NO GROWTH 2 DAYS Performed at [facility] [address], [place], [place] [address]    Report Status PENDING  Incomplete  Resp panel by RT-PCR (RSV, Flu A&B, Covid) Anterior Nasal Swab     Status: None   Collection Time: 11/24/22 12:47 PM   Specimen: Anterior Nasal Swab  Result Value Ref Range Status   SARS Coronavirus 2 by RT PCR NEGATIVE NEGATIVE Final    Comment: (NOTE) SARS-CoV-2 target nucleic acids are NOT DETECTED.  The SARS-CoV-2 RNA is generally detectable in upper respiratory specimens during the acute phase of infection. The lowest concentration of SARS-CoV-2 viral copies this assay can detect is 138 copies/mL. A negative result does not preclude SARS-Cov-2 infection and should not be used as the sole basis for treatment or other patient management decisions. A negative result may occur with  improper specimen  collection/handling, submission of specimen other than nasopharyngeal swab, presence of viral mutation(s) within the areas targeted by this assay, and inadequate number of viral copies(<138 copies/mL). A negative result must be combined with clinical observations, patient history, and epidemiological information. The expected result is Negative.  Fact Sheet for Patients:  EntrepreneurPulse.com.au  Fact Sheet for Healthcare Providers:  IncredibleEmployment.be  This test is no t yet approved or cleared by the [place] FDA and  has been authorized for detection and/or diagnosis of SARS-CoV-2 by FDA under an Emergency Use Authorization (EUA). This EUA will remain  in effect (meaning this test can be used) for the duration of the COVID-19 declaration under Section 564(b)(1) of the Act, 21 U.S.C.section 360bbb-3(b)(1), unless the authorization is terminated  or revoked sooner.       Influenza A by PCR NEGATIVE NEGATIVE Final   Influenza B by PCR NEGATIVE NEGATIVE Final    Comment: (NOTE) The Xpert Xpress SARS-CoV-2/FLU/RSV plus assay is intended as an aid in the diagnosis of influenza from Nasopharyngeal swab specimens and should not be used as a sole basis for treatment. Nasal washings and aspirates are unacceptable for Xpert Xpress SARS-CoV-2/FLU/RSV testing.  Fact Sheet for Patients: EntrepreneurPulse.com.au  Fact Sheet for Healthcare Providers: IncredibleEmployment.be  This test is not yet approved or cleared by the [place] FDA and has been authorized for detection and/or diagnosis of SARS-CoV-2 by FDA under an Emergency Use Authorization (EUA). This EUA will remain in effect (meaning this test can be used) for the duration of the COVID-19 declaration under Section 564(b)(1) of the Act, 21 U.S.C. section 360bbb-3(b)(1), unless the authorization is terminated or revoked.     Resp Syncytial  Virus by PCR NEGATIVE NEGATIVE Final    Comment: (NOTE) Fact Sheet for Patients: EntrepreneurPulse.com.au  Fact Sheet for Healthcare Providers: IncredibleEmployment.be  This test is not yet approved or cleared by the [place] FDA and has been authorized for detection and/or diagnosis of SARS-CoV-2 by FDA under an Emergency Use Authorization (EUA). This EUA will remain in effect (meaning this test can be used) for the duration of the COVID-19 declaration under Section 564(b)(1) of the Act, 21 U.S.C. section 360bbb-3(b)(1), unless the authorization is terminated or revoked.  Performed at [facility], [place] [address], [place], [place] [address]  Blood culture (routine x 2)     Status: None (Preliminary result)   Collection Time: 11/25/22  2:18 AM   Specimen: BLOOD LEFT ARM  Result Value Ref Range Status   Specimen Description BLOOD LEFT ARM  Final   Special Requests   Final    BOTTLES DRAWN AEROBIC AND ANAEROBIC Blood Culture results may not be optimal due to an excessive volume of blood received in culture bottles   Culture   Final    NO GROWTH 1 DAY Performed at [facility], [place] [address], [place], [place] [address]    Report Status PENDING  Incomplete         Radiology Studies: DG Chest Port 1 View  Result Date: 11/25/2022 CLINICAL DATA:  Central venous catheter placement EXAM: PORTABLE CHEST 1 VIEW COMPARISON:  11/25/2022 FINDINGS: Left internal jugular hemodialysis catheter tip is seen within the terminal left brachiocephalic vein at its expected juncture with the superior vena cava. Vascular stent noted within the expected innominate vein Lung volumes are small. Multifocal pulmonary infiltrates within the left mid lung zone and right lung base are again identified and appear stable. No pneumothorax or pleural effusion. Cardiac size is within normal limits when accounting for poor pulmonary insufflation. Right total  shoulder arthroplasty has been performed. No acute bone abnormality. IMPRESSION: 1. Left internal jugular hemodialysis catheter tip within the terminal left brachiocephalic vein. No pneumothorax. 2. Pulmonary hypoinflation. 3. Stable multifocal pulmonary infiltrates. Electronically Signed   By: Fidela Salisbury M.D.   On: 11/25/2022 15:05   DG Chest Port 1 View  Result Date: 11/25/2022 CLINICAL DATA:  Respiratory complication; history CHF, COPD, diabetes mellitus, hypertension, end-stage renal disease EXAM: PORTABLE CHEST 1 VIEW COMPARISON:  Portable exam 0730 hours compared to 11/22/2022 FINDINGS: Enlargement of cardiac silhouette with pulmonary vascular congestion. Patchy BILATERAL pulmonary infiltrates which could represent pulmonary edema or multifocal infection. Slightly prominent mediastinal contours with stent identified at RIGHT superior mediastinum. No pleural effusion or pneumothorax. Prior cervical spine fusion and RIGHT shoulder arthroplasty. IMPRESSION: Enlargement of cardiac silhouette with pulmonary vascular congestion. Patchy BILATERAL pulmonary infiltrates question edema versus multifocal pneumonia. Electronically Signed   By: Lavonia Dana M.D.   On: 11/25/2022 07:46        Scheduled Meds:  amLODipine  5 mg Oral BID   aspirin EC  81 mg Oral q AM   atorvastatin  40 mg Oral q AM   calcium acetate  1,334 mg Oral BID WC   carvedilol  12.5 mg Oral BID WC   Chlorhexidine Gluconate Cloth  6 each Topical Q0600   Chlorhexidine Gluconate Cloth  6 each Topical Q0600   clopidogrel  75 mg Oral Daily   heparin  5,000 Units Subcutaneous Q8H   insulin aspart  0-20 Units Subcutaneous TID WC   insulin aspart  0-5 Units Subcutaneous QHS   insulin aspart  10 Units Intravenous Once   insulin glargine-yfgn  60 Units Subcutaneous BID   metolazone  20 mg Oral Daily   nortriptyline  20 mg Oral QHS   pantoprazole  40 mg Oral QAC breakfast   pregabalin  75 mg Oral q AM   sertraline  100 mg Oral q AM    sodium chloride flush  3 mL Intravenous Q12H   tamsulosin  0.4 mg Oral Daily   torsemide  100 mg Oral BID   Continuous Infusions:   LOS: 1 day    Time spent: 35 minutes spent on chart review, discussion with  nursing staff, consultants, updating family and interview/physical exam; more than 50% of that time was spent in counseling and/or coordination of care.    Little Ishikawa, DO [facility] Available via Epic secure chat 7am-7pm After these hours, please refer to coverage provider listed on [email or web address] 11/26/2022, 8:37 AM

## 2022-11-26 NOTE — Progress Notes (Signed)
report given to Devin Bozeman, RN 

## 2022-11-26 NOTE — Progress Notes (Signed)
[facility] Progress Note  Subjective: CCM placed temp HD cath last night and pt had HD w/ only 1000 cc UF, BP's were too low. BP's better today.   Vitals:   11/26/22 0448 11/26/22 0800 11/26/22 0930 11/26/22 1220  BP:  127/74  (!) 138/91  Pulse: 87 88 92 96  Resp: 13 19 20 16   Temp:  98.2 F (36.8 C)  98 F (36.7 C)  TempSrc:  Axillary  Oral  SpO2: 97% 95% 93% 92%  Weight:        Exam: Gen alert, no distress, off bipap Morbid obesity No jvd or bruits Chest occ basilar rales bilat, no ^wob RRR no MRG Abd soft ntnd no mass or ascites +bs Ext diffuse 2+ bilat pitting LE edema Neuro is alert, Ox 3 , nf    RUA AVG +bruit      Home meds include - amlodipine 10, asa, lipitor, phoslo 2 ac tid, coreg 12.5 bid, plavix, valium, insulin regular, zaroxolyn 20 qd, pamelor, protonix, lyrica, sertraline, flomax, demadex, trazodone, prns/ vits/ supps        OP HD: most recent orders >   3h 53min   129kg      CXR - L infiltrate vs atx, no edema   Assessment/ Plan: SOB/ vol overload - marked LE edema due to reasons noted below and now on bipap as well. Pt was released/ discharged from his OP HD unit 12/15 due to behavior issues. Last HD 2 wks ago. AVG was not functional so didn't get dialysis 12/28 pm. CCM placed temp HD cath for [place] on 12/29 and pt had HD overnight w/ 1 L off due to soft BP's.  HD access - RUA AVG w/ good bruit today. Will use temp cath for the weekend, maybe try and stick the AVG on Monday.  ESRD - released from OP HD unit recently as above. Had HD yesterday, plan HD again today, then probably on Monday. Needs extra HD but don't have the staff really.  BP/ volume - have dc'd all home meds due to soft BP's. Sig vol excess, CXR on admit was clear though. Have added prn midodrine 10mg  pre to each iHD if BP's low.  Anemia esrd - Hb 9-10s, follow.  MBD ckd - CCa and phos in range, cont phoslo 2 ac as binder.  PTSD/ hx psychosis    Kelly Splinter 11/26/2022,  2:49 PM   Recent Labs  Lab 11/24/22 1422 11/25/22 0218 11/25/22 1611  HGB 10.2* 9.1*  --   ALBUMIN  --  3.0* 3.0*  CALCIUM  --  7.2* 7.3*  PHOS  --  5.5* 5.6*  CREATININE 5.50* 5.52* 5.80*  K 4.2 4.5 4.0    No results for input(s): "IRON", "TIBC", "FERRITIN" in the last 168 hours. Inpatient medications:  aspirin EC  81 mg Oral q AM   atorvastatin  40 mg Oral q AM   calcium acetate  1,334 mg Oral BID WC   Chlorhexidine Gluconate Cloth  6 each Topical Q0600   Chlorhexidine Gluconate Cloth  6 each Topical Q0600   clopidogrel  75 mg Oral Daily   heparin  5,000 Units Subcutaneous Q8H   insulin aspart  0-20 Units Subcutaneous TID WC   insulin aspart  0-5 Units Subcutaneous QHS   insulin aspart  10 Units Intravenous Once   insulin glargine-yfgn  60 Units Subcutaneous BID   metolazone  20 mg Oral Daily   nortriptyline  20 mg Oral QHS  pantoprazole  40 mg Oral QAC breakfast   pregabalin  75 mg Oral q AM   sertraline  100 mg Oral q AM   sodium chloride flush  3 mL Intravenous Q12H   tamsulosin  0.4 mg Oral Daily   torsemide  100 mg Oral BID    acetaminophen **OR** acetaminophen, albuterol, diazepam, polyethylene glycol, traZODone

## 2022-11-26 NOTE — Plan of Care (Signed)

## 2022-11-27 ENCOUNTER — Encounter (HOSPITAL_COMMUNITY): Payer: Self-pay | Admitting: Internal Medicine

## 2022-11-27 DIAGNOSIS — E8779 Other fluid overload: Secondary | ICD-10-CM | POA: Diagnosis not present

## 2022-11-27 DIAGNOSIS — I5032 Chronic diastolic (congestive) heart failure: Secondary | ICD-10-CM | POA: Diagnosis not present

## 2022-11-27 DIAGNOSIS — N186 End stage renal disease: Secondary | ICD-10-CM | POA: Diagnosis not present

## 2022-11-27 LAB — CBC
HCT: 31.1 % — ABNORMAL LOW (ref 39.0–52.0)
Hemoglobin: 9.9 g/dL — ABNORMAL LOW (ref 13.0–17.0)
MCH: 28.4 pg (ref 26.0–34.0)
MCHC: 31.8 g/dL (ref 30.0–36.0)
MCV: 89.4 fL (ref 80.0–100.0)
Platelets: 105 10*3/uL — ABNORMAL LOW (ref 150–400)
RBC: 3.48 MIL/uL — ABNORMAL LOW (ref 4.22–5.81)
RDW: 14.5 % (ref 11.5–15.5)
WBC: 5.7 10*3/uL (ref 4.0–10.5)
nRBC: 0 % (ref 0.0–0.2)

## 2022-11-27 LAB — GLUCOSE, CAPILLARY
Glucose-Capillary: 156 mg/dL — ABNORMAL HIGH (ref 70–99)
Glucose-Capillary: 135 mg/dL — ABNORMAL HIGH (ref 70–99)
Glucose-Capillary: 143 mg/dL — ABNORMAL HIGH (ref 70–99)
Glucose-Capillary: 208 mg/dL — ABNORMAL HIGH (ref 70–99)

## 2022-11-27 LAB — BASIC METABOLIC PANEL
Anion gap: 12 (ref 5–15)
BUN: 73 mg/dL — ABNORMAL HIGH (ref 8–23)
CO2: 28 mmol/L (ref 22–32)
Calcium: 7.7 mg/dL — ABNORMAL LOW (ref 8.9–10.3)
Chloride: 98 mmol/L (ref 98–111)
Creatinine, Ser: 4.02 mg/dL — ABNORMAL HIGH (ref 0.61–1.24)
GFR, Estimated: 16 mL/min — ABNORMAL LOW (ref >60)
Glucose, Bld: 185 mg/dL — ABNORMAL HIGH (ref 70–99)
Potassium: 3.5 mmol/L (ref 3.5–5.1)
Sodium: 138 mmol/L (ref 135–145)

## 2022-11-27 MED ORDER — OXYCODONE HCL 5 MG PO TABS
5.0000 mg | ORAL_TABLET | Freq: Four times a day (QID) | ORAL | Status: AC | PRN
  Administered 2022-11-27 – 2022-11-28 (×2): 5 mg via ORAL
  Filled 2022-11-27 (×2): qty 1

## 2022-11-27 MED ORDER — AMLODIPINE BESYLATE 5 MG PO TABS
2.5000 mg | ORAL_TABLET | Freq: Two times a day (BID) | ORAL | Status: DC
  Administered 2022-11-27 – 2022-12-04 (×14): 2.5 mg via ORAL
  Filled 2022-11-27 (×15): qty 1

## 2022-11-27 MED ORDER — CARVEDILOL 6.25 MG PO TABS
6.2500 mg | ORAL_TABLET | Freq: Two times a day (BID) | ORAL | Status: DC
  Administered 2022-11-27 – 2022-12-04 (×14): 6.25 mg via ORAL
  Filled 2022-11-27 (×14): qty 1

## 2022-11-27 NOTE — Progress Notes (Signed)
Patient tried out bipap, took it off and said he does not want to wear it tonight.

## 2022-11-27 NOTE — Progress Notes (Signed)
Pt resting on 5l [place] no distress noted. Vitals stable. Pt refused bipap.

## 2022-11-27 NOTE — Progress Notes (Signed)
[facility] Progress Note  Subjective: seen in room.  UF yest was 2100 cc. BP's were normal to high during session. Voided also 600 cc yesterday.   Vitals:   11/26/22 2049 11/27/22 0100 11/27/22 0317 11/27/22 0834  BP: (!) 166/60 (!) 145/76 (!) 153/67 (!) 183/71  Pulse: 96 95 89   Resp: 17 15 15    Temp: 98.8 F (37.1 C) 98.5 F (36.9 C) 98 F (36.7 C) 98.1 F (36.7 C)  TempSrc: Oral Oral Oral Oral  SpO2: 91% 91% 92%   Weight:        Exam: Gen alert, no distress, off bipap Morbid obesity No jvd or bruits Chest no ^wob, clear bilat RRR no MRG Abd soft ntnd no mass or ascites +bs Ext diffuse 2+ bilat pitting LE edema Neuro is alert, Ox 3 , nf    RUA AVG +bruit      Home meds include - amlodipine 10, asa, lipitor, phoslo 2 ac tid, coreg 12.5 bid, plavix, valium, insulin regular, zaroxolyn 20 qd, pamelor, protonix, lyrica, sertraline, flomax, demadex, trazodone, prns/ vits/ supps        OP HD: most recent orders [facility] ([facility] from [facility] on 11/11/22) >  3h 67min   129kg  Hep 2000  P4   RUA AVG    CXR - L infiltrate vs atx, no edema   Assessment/ Plan: Vol overload - pt was released/ discharged from his OP HD unit 12/15 due to behavior issues. Last HD 2 wks ago. Was on bipap briefly, is off now.  Had HD here 12/29 and 12/30, but the UF wasn't good the 1st time due to low BP's. BP's were much better for 2nd HD and got 2.1 L (goal 3.5) off.  Most of his vol overload is in the legs, not sure but may be a chronic R HF situation. Next HD here will be on Tuesday. Goals are to (1) to get him down to his basic O2 requirement and (2) retry the AVG. When these are completed he should be able to be dc'd.  HD access - AVG was not functional 12/28 so CCM placed temp HD cath for [place] on 12/29 and still remains in place. RUA AVG has a good bruit today. Will keep temp cath in place and attempt to use the AVG w/ next HD Tuesday.  ESRD - pt was discharged from his OP HD unit  recently as above. HD here as above. After discharge, pt will have to return to hospital here for future dialysis (assuming he doesn't find another HD unit that will accept him).  HTN - we put his BP lowering on hold due to hypotension earlier this admission, will resume them cautiously today at lower dosing bid with goal of not overtreating his BP which could hamper our ability to get his volume excess under control.  Anemia esrd - Hb 9-10s, follow.  MBD ckd - CCa and phos in range, cont phoslo 2 ac as binder.  PTSD/ hx psychosis    Kelly Splinter 11/27/2022, 11:51 AM   Recent Labs  Lab 11/25/22 1611 11/26/22 1748 11/27/22 0537  HGB  --  9.4* 9.9*  ALBUMIN 3.0* 3.0*  --   CALCIUM 7.3* 7.6* 7.7*  PHOS 5.6* 4.3  --   CREATININE 5.80* 4.20* 4.02*  K 4.0 3.7 3.5    No results for input(s): "IRON", "TIBC", "FERRITIN" in the last 168 hours. Inpatient medications:  aspirin EC  81 mg Oral q AM  atorvastatin  40 mg Oral q AM   calcium acetate  1,334 mg Oral BID WC   Chlorhexidine Gluconate Cloth  6 each Topical Q0600   Chlorhexidine Gluconate Cloth  6 each Topical Q0600   clopidogrel  75 mg Oral Daily   heparin  5,000 Units Subcutaneous Q8H   insulin aspart  0-20 Units Subcutaneous TID WC   insulin aspart  0-5 Units Subcutaneous QHS   insulin aspart  10 Units Intravenous Once   insulin glargine-yfgn  60 Units Subcutaneous BID   metolazone  20 mg Oral Daily   nortriptyline  20 mg Oral QHS   pantoprazole  40 mg Oral QAC breakfast   pregabalin  75 mg Oral q AM   sertraline  100 mg Oral q AM   sodium chloride flush  3 mL Intravenous Q12H   tamsulosin  0.4 mg Oral Daily   torsemide  100 mg Oral BID    acetaminophen **OR** acetaminophen, albuterol, diazepam, midodrine, polyethylene glycol, traZODone

## 2022-11-27 NOTE — Progress Notes (Signed)
PROGRESS NOTE    Jeffrey [name]  FTD:[id number] DOB: 07-Jun-1961 DOA: 11/23/2022 PCP: Kristie Cowman, MD    Brief Narrative:  Jeffrey [name] is a 61 y.o. male with medical history significant of ESRD, diabetes, degenerative disc disease status post vertebral fusions, orthopedic surgeries, chronic pain, PTSD, mood disorder, depression, anxiety, OSA, anemia, COPD, CHF, BPH, pulmonary hypertension, anaphylaxis, hyperlipidemia, obesity presenting with shortness of breath, fatigue, nausea, body aches -all exacerbated by medical noncompliance.  Symptoms worsening over the past 7 days prior to admission after missing at least 1 week of dialysis with worsening shortness of breath, orthopnea and dyspnea with exertion.  He is on chronic oxygen at home for the past several weeks at 3 L.  Shortness of breath and fatigue worsens with activity improves with rest and oxygen. [facility] called for admission.   Assessment and Plan:  Volume overload ESRD Hypocalcemia Uremia Acute on chronic respiratory failure with hypoxia > Has not had dialysis in a week due to being discharged from his dialysis center per reports.  > Has been on chronic oxygen for at least the past several weeks at 3 L.-- initially BIPAP dependent - now weaning [place] oxygen as tolerated - down to 5L [place] today at rest. > Nephrology following - HD per temp catheter placed by PCCM  > Will likely need definitive access - Vascular consulted - likely to attempt grafting/intervention next week pending further workup/imaging. - I/O net negative - 4kg weight loss since intake   Abnormal chest x-ray Fatigue and bodyaches Sepsis ruled out > Questionable early pneumonia on chest x-ray patient received ceftriaxone and azithromycin x1 in the ED. > Likely secondary to volume overload given patient was negative for flu COVID and RSV. - Hold off on further antibiotics and follow clinically -Remains afebrile without leukocytosis   Anemia, likely chronic anemia of  chronic disease > Hemoglobin near baseline at 9.6 also degree of hemodilution likely.   - Trend CBC   Insulin-dependent diabetes, uncontrolled with hyperglycemia  -Continue sliding scale insulin, hypoglycemic protocol -Continue glargine 60 units twice daily -A1c 10.5 - discussed need for dietary/medication compliance  Degenerative disc disease Chronic pain > Status post multiple surgical interventions. - Continue home nortriptyline, Lyrica   PTSD Mood disorder Depression - Continue home sertraline, nortriptyline, diazepam, trazodone   OSA - CPAP ordered - patient noncompliant overnight   COPD without acute exacerbation - History of this listed in chart, do not see any maintenance inhalers. - Continue as needed albuterol   Acute on chronic Diastolic CHF > Listed in chart.  Last echo showed EF 06-23%, normal diastolic function, normal RV function. > Volume issues appear to be related to renal disease and missed dialysis only. - Volume management with dialysis - continue metolazone/torsemide per med rec   Hypertension - Continue home amlodipine, carvedilol - Torsemide, metolazone per renal   Hyperlipidemia - Continue home atorvastatin   Obesity Estimated body mass index is 57.31 kg/m as calculated from the following:   Height as of 10/31/22: 5' (1.524 m).   Weight as of this encounter: 133.1 kg.     DVT prophylaxis: heparin injection 5,000 Units Start: 11/24/22 1630    Code Status: Full Code   Disposition Plan:  Level of care: Progressive Status is: Inpt The patient will require care spanning > 2 midnights and should be moved to inpatient because: needs HD    Consultants:  Renal PCCM (for HD access)  Subjective: No acute issues or events overnight respiratory status improving denies nausea vomiting  diarrhea constipation headache fevers chills or chest pain  Objective: Vitals:   11/26/22 2007 11/26/22 2049 11/27/22 0100 11/27/22 0317  BP: (!) 150/61 (!)  166/60 (!) 145/76 (!) 153/67  Pulse: 94 96 95 89  Resp: (!) 99 17 15 15   Temp: 98.9 F (37.2 C) 98.8 F (37.1 C) 98.5 F (36.9 C) 98 F (36.7 C)  TempSrc: Oral Oral Oral Oral  SpO2: 91% 91% 91% 92%  Weight:        Intake/Output Summary (Last 24 hours) at 11/27/2022 0809 Last data filed at 11/27/2022 0600 Gross per 24 hour  Intake --  Output 600 ml  Net -600 ml    Filed Weights   11/25/22 1603 11/25/22 2028 11/26/22 1730  Weight: (!) 137.6 kg (!) 136.7 kg 133.1 kg    Examination:   General: Appearance:    Severely obese male in no acute distress     Lungs:     On bipap-- diminished at bases  Heart:    Normal heart rate.       Neurologic:   Awake, alert.        Data Reviewed: I have personally reviewed following labs and imaging studies  CBC: Recent Labs  Lab 11/24/22 1414 11/24/22 1422 11/25/22 0218 11/26/22 1748 11/27/22 0537  WBC 7.3  --  8.0 7.0 5.7  NEUTROABS 6.3  --   --   --   --   HGB 9.6* 10.2* 9.1* 9.4* 9.9*  HCT 30.5* 30.0* 28.6* 29.5* 31.1*  MCV 91.0  --  89.4 91.0 89.4  PLT 120*  --  126* 127* 105*    Basic Metabolic Panel: Recent Labs  Lab 11/24/22 1414 11/24/22 1422 11/25/22 0218 11/25/22 1611 11/26/22 1748 11/27/22 0537  NA 130* 130* 133* 140 135 138  K 4.2 4.2 4.5 4.0 3.7 3.5  CL 90* 89* 91* 96* 96* 98  CO2 25  --  28 31 29 28   GLUCOSE 667* 675* 504* 121* 181* 185*  BUN 119* >130* 120* 123* 72* 73*  CREATININE 5.24* 5.50* 5.52* 5.80* 4.20* 4.02*  CALCIUM 7.0*  --  7.2* 7.3* 7.6* 7.7*  PHOS  --   --  5.5* 5.6* 4.3  --     GFR: Estimated Creatinine Clearance: 22.4 mL/min (A) (by C-G formula based on SCr of 4.02 mg/dL (H)). Liver Function Tests: Recent Labs  Lab 11/24/22 1414 11/25/22 0218 11/25/22 1611 11/26/22 1748  AST 11*  --   --   --   ALT 15  --   --   --   ALKPHOS 68  --   --   --   BILITOT 0.5  --   --   --   PROT 7.0  --   --   --   ALBUMIN 3.1* 3.0* 3.0* 3.0*    No results for input(s): "LIPASE",  "AMYLASE" in the last 168 hours. No results for input(s): "AMMONIA" in the last 168 hours. Coagulation Profile: No results for input(s): "INR", "PROTIME" in the last 168 hours. Cardiac Enzymes: No results for input(s): "CKTOTAL", "CKMB", "CKMBINDEX", "TROPONINI" in the last 168 hours. BNP (last 3 results) No results for input(s): "PROBNP" in the last 8760 hours. HbA1C: Recent Labs    11/25/22 0218  HGBA1C 10.5*    CBG: Recent Labs  Lab 11/25/22 2244 11/26/22 0837 11/26/22 1141 11/26/22 1521 11/26/22 2150  GLUCAP 66* 147* 164* 157* 211*    Lipid Profile: No results for input(s): "CHOL", "HDL", "LDLCALC", "TRIG", "CHOLHDL", "LDLDIRECT"  in the last 72 hours. Thyroid Function Tests: No results for input(s): "TSH", "T4TOTAL", "FREET4", "T3FREE", "THYROIDAB" in the last 72 hours. Anemia Panel: No results for input(s): "VITAMINB12", "FOLATE", "FERRITIN", "TIBC", "IRON", "RETICCTPCT" in the last 72 hours. Sepsis Labs: Recent Labs  Lab 11/24/22 1414  LATICACIDVEN 1.1     Recent Results (from the past 240 hour(s))  Blood culture (routine x 2)     Status: None (Preliminary result)   Collection Time: 11/24/22 12:46 PM   Specimen: BLOOD  Result Value Ref Range Status   Specimen Description BLOOD RIGHT ANTECUBITAL  Final   Special Requests   Final    BOTTLES DRAWN AEROBIC AND ANAEROBIC Blood Culture results may not be optimal due to an inadequate volume of blood received in culture bottles   Culture   Final    NO GROWTH 3 DAYS Performed at [facility], [place] [address], [place],  [address]    Report Status PENDING  Incomplete  Resp panel by RT-PCR (RSV, Flu A&B, Covid) Anterior Nasal Swab     Status: None   Collection Time: 11/24/22 12:47 PM   Specimen: Anterior Nasal Swab  Result Value Ref Range Status   SARS Coronavirus 2 by RT PCR NEGATIVE NEGATIVE Final    Comment: (NOTE) SARS-CoV-2 target nucleic acids are NOT DETECTED.  The SARS-CoV-2 RNA is generally  detectable in upper respiratory specimens during the acute phase of infection. The lowest concentration of SARS-CoV-2 viral copies this assay can detect is 138 copies/mL. A negative result does not preclude SARS-Cov-2 infection and should not be used as the sole basis for treatment or other patient management decisions. A negative result may occur with  improper specimen collection/handling, submission of specimen other than nasopharyngeal swab, presence of viral mutation(s) within the areas targeted by this assay, and inadequate number of viral copies(<138 copies/mL). A negative result must be combined with clinical observations, patient history, and epidemiological information. The expected result is Negative.  Fact Sheet for Patients:  EntrepreneurPulse.com.au  Fact Sheet for Healthcare Providers:  IncredibleEmployment.be  This test is no t yet approved or cleared by the [place] FDA and  has been authorized for detection and/or diagnosis of SARS-CoV-2 by FDA under an Emergency Use Authorization (EUA). This EUA will remain  in effect (meaning this test can be used) for the duration of the COVID-19 declaration under Section 564(b)(1) of the Act, 21 U.S.C.section 360bbb-3(b)(1), unless the authorization is terminated  or revoked sooner.       Influenza A by PCR NEGATIVE NEGATIVE Final   Influenza B by PCR NEGATIVE NEGATIVE Final    Comment: (NOTE) The Xpert Xpress SARS-CoV-2/FLU/RSV plus assay is intended as an aid in the diagnosis of influenza from Nasopharyngeal swab specimens and should not be used as a sole basis for treatment. Nasal washings and aspirates are unacceptable for Xpert Xpress SARS-CoV-2/FLU/RSV testing.  Fact Sheet for Patients: EntrepreneurPulse.com.au  Fact Sheet for Healthcare Providers: IncredibleEmployment.be  This test is not yet approved or cleared by the [place] FDA  and has been authorized for detection and/or diagnosis of SARS-CoV-2 by FDA under an Emergency Use Authorization (EUA). This EUA will remain in effect (meaning this test can be used) for the duration of the COVID-19 declaration under Section 564(b)(1) of the Act, 21 U.S.C. section 360bbb-3(b)(1), unless the authorization is terminated or revoked.     Resp Syncytial Virus by PCR NEGATIVE NEGATIVE Final    Comment: (NOTE) Fact Sheet for Patients: EntrepreneurPulse.com.au  Fact  Sheet for Healthcare Providers: IncredibleEmployment.be  This test is not yet approved or cleared by the [place] and has been authorized for detection and/or diagnosis of SARS-CoV-2 by FDA under an Emergency Use Authorization (EUA). This EUA will remain in effect (meaning this test can be used) for the duration of the COVID-19 declaration under Section 564(b)(1) of the Act, 21 U.S.C. section 360bbb-3(b)(1), unless the authorization is terminated or revoked.  Performed at [facility], [place] [address], [place], [place] [address]   Blood culture (routine x 2)     Status: None (Preliminary result)   Collection Time: 11/25/22  2:18 AM   Specimen: BLOOD LEFT ARM  Result Value Ref Range Status   Specimen Description BLOOD LEFT ARM  Final   Special Requests   Final    BOTTLES DRAWN AEROBIC AND ANAEROBIC Blood Culture results may not be optimal due to an excessive volume of blood received in culture bottles   Culture   Final    NO GROWTH 2 DAYS Performed at [facility], [place] [address] [place], [place] [address]    Report Status PENDING  Incomplete         Radiology Studies: DG Chest Port 1 View  Result Date: 11/25/2022 CLINICAL DATA:  Central venous catheter placement EXAM: PORTABLE CHEST 1 VIEW COMPARISON:  11/25/2022 FINDINGS: Left internal jugular hemodialysis catheter tip is seen within the terminal left brachiocephalic vein at its expected  juncture with the superior vena cava. Vascular stent noted within the expected innominate vein Lung volumes are small. Multifocal pulmonary infiltrates within the left mid lung zone and right lung base are again identified and appear stable. No pneumothorax or pleural effusion. Cardiac size is within normal limits when accounting for poor pulmonary insufflation. Right total shoulder arthroplasty has been performed. No acute bone abnormality. IMPRESSION: 1. Left internal jugular hemodialysis catheter tip within the terminal left brachiocephalic vein. No pneumothorax. 2. Pulmonary hypoinflation. 3. Stable multifocal pulmonary infiltrates. Electronically Signed   By: Fidela Salisbury M.D.   On: 11/25/2022 15:05        Scheduled Meds:  aspirin EC  81 mg Oral q AM   atorvastatin  40 mg Oral q AM   calcium acetate  1,334 mg Oral BID WC   Chlorhexidine Gluconate Cloth  6 each Topical Q0600   Chlorhexidine Gluconate Cloth  6 each Topical Q0600   clopidogrel  75 mg Oral Daily   heparin  5,000 Units Subcutaneous Q8H   heparin sodium (porcine)       insulin aspart  0-20 Units Subcutaneous TID WC   insulin aspart  0-5 Units Subcutaneous QHS   insulin aspart  10 Units Intravenous Once   insulin glargine-yfgn  60 Units Subcutaneous BID   metolazone  20 mg Oral Daily   nortriptyline  20 mg Oral QHS   pantoprazole  40 mg Oral QAC breakfast   pregabalin  75 mg Oral q AM   sertraline  100 mg Oral q AM   sodium chloride flush  3 mL Intravenous Q12H   tamsulosin  0.4 mg Oral Daily   torsemide  100 mg Oral BID   Continuous Infusions:   LOS: 2 days    Time spent: 35 minutes spent on chart review, discussion with nursing staff, consultants, updating family and interview/physical exam; more than 50% of that time was spent in counseling and/or coordination of care.    Little Ishikawa, DO [facility] Available via Epic secure chat  7am-7pm After these hours, please refer to coverage provider  listed on [email or web address] 11/27/2022, 8:09 AM

## 2022-11-28 DIAGNOSIS — I5032 Chronic diastolic (congestive) heart failure: Secondary | ICD-10-CM | POA: Diagnosis not present

## 2022-11-28 DIAGNOSIS — E8779 Other fluid overload: Secondary | ICD-10-CM | POA: Diagnosis not present

## 2022-11-28 DIAGNOSIS — N186 End stage renal disease: Secondary | ICD-10-CM | POA: Diagnosis not present

## 2022-11-28 LAB — GLUCOSE, CAPILLARY
Glucose-Capillary: 171 mg/dL — ABNORMAL HIGH (ref 70–99)
Glucose-Capillary: 195 mg/dL — ABNORMAL HIGH (ref 70–99)
Glucose-Capillary: 206 mg/dL — ABNORMAL HIGH (ref 70–99)
Glucose-Capillary: 230 mg/dL — ABNORMAL HIGH (ref 70–99)

## 2022-11-28 MED ORDER — LIDOCAINE-PRILOCAINE 2.5-2.5 % EX CREA: 1.0000 | TOPICAL_CREAM | CUTANEOUS | Status: DC | PRN

## 2022-11-28 MED ORDER — CHLORHEXIDINE GLUCONATE CLOTH 2 % EX PADS
6.0000 | MEDICATED_PAD | Freq: Every day | CUTANEOUS | Status: DC
  Administered 2022-11-29 – 2022-12-02 (×3): 6 via TOPICAL

## 2022-11-28 MED ORDER — PENTAFLUOROPROP-TETRAFLUOROETH EX AERO: 1.0000 | INHALATION_SPRAY | CUTANEOUS | Status: DC | PRN

## 2022-11-28 MED ORDER — HEPARIN SODIUM (PORCINE) 1000 UNIT/ML DIALYSIS: 1000.0000 [IU] | INTRAMUSCULAR | Status: DC | PRN

## 2022-11-28 MED ORDER — LIDOCAINE HCL (PF) 1 % IJ SOLN: 5.0000 mL | INTRAMUSCULAR | Status: DC | PRN

## 2022-11-28 MED ORDER — ALTEPLASE 2 MG IJ SOLR: 2.0000 mg | Freq: Once | INTRAMUSCULAR | Status: DC | PRN

## 2022-11-28 NOTE — Progress Notes (Signed)
[facility] Progress Note   Subjective:    Seen and examined patient at bedside. Last HD 12/30 with net UF 2.1L. No acute complaints. Next HD 11/29/22.  Objective Vitals:   11/27/22 1900 11/28/22 0000 11/28/22 0355 11/28/22 0500  BP: (!) 171/71 (!) 147/65 (!) 158/64 (!) 171/65  Pulse: 85 87 88 86  Resp: 17  18 14   Temp: 98.2 F (36.8 C) 98 F (36.7 C) 97.8 F (36.6 C) 97.8 F (36.6 C)  TempSrc: Oral Oral Axillary Oral  SpO2: 95% 93% 93% 96%  Weight:       Physical Exam General: Awake, alert, obese, NAD Heart: S1 and S2; No MRGs Lungs: Clear anteriorly; diminished laterally  Abdomen: Obese, non-tender Extremities: BL AKA's, no edema BL stumps Dialysis Access: L temp HD cath; R AVG (+) B/T   Filed Weights   11/25/22 1603 11/25/22 2028 11/26/22 1730  Weight: (!) 137.6 kg (!) 136.7 kg 133.1 kg    Intake/Output Summary (Last 24 hours) at 11/28/2022 0932 Last data filed at 11/28/2022 0900 Gross per 24 hour  Intake --  Output 2500 ml  Net -2500 ml    Additional Objective Labs: Basic Metabolic Panel: Recent Labs  Lab 11/25/22 0218 11/25/22 1611 11/26/22 1748 11/27/22 0537  NA 133* 140 135 138  K 4.5 4.0 3.7 3.5  CL 91* 96* 96* 98  CO2 28 31 29 28   GLUCOSE 504* 121* 181* 185*  BUN 120* 123* 72* 73*  CREATININE 5.52* 5.80* 4.20* 4.02*  CALCIUM 7.2* 7.3* 7.6* 7.7*  PHOS 5.5* 5.6* 4.3  --    Liver Function Tests: Recent Labs  Lab 11/24/22 1414 11/25/22 0218 11/25/22 1611 11/26/22 1748  AST 11*  --   --   --   ALT 15  --   --   --   ALKPHOS 68  --   --   --   BILITOT 0.5  --   --   --   PROT 7.0  --   --   --   ALBUMIN 3.1* 3.0* 3.0* 3.0*   No results for input(s): "LIPASE", "AMYLASE" in the last 168 hours. CBC: Recent Labs  Lab 11/24/22 1414 11/24/22 1422 11/25/22 0218 11/26/22 1748 11/27/22 0537  WBC 7.3  --  8.0 7.0 5.7  NEUTROABS 6.3  --   --   --   --   HGB 9.6*   < > 9.1* 9.4* 9.9*  HCT 30.5*   < > 28.6* 29.5* 31.1*  MCV 91.0   --  89.4 91.0 89.4  PLT 120*  --  126* 127* 105*   < > = values in this interval not displayed.   Blood Culture    Component Value Date/Time   SDES BLOOD LEFT ARM 11/25/2022 0218   SPECREQUEST  11/25/2022 5053    BOTTLES DRAWN AEROBIC AND ANAEROBIC Blood Culture results may not be optimal due to an excessive volume of blood received in culture bottles   CULT  11/25/2022 0218    NO GROWTH 3 DAYS Performed at [facility], [place] [address], [place],  [address]    REPTSTATUS PENDING 11/25/2022 4193    Cardiac Enzymes: No results for input(s): "CKTOTAL", "CKMB", "CKMBINDEX", "TROPONINI" in the last 168 hours. CBG: Recent Labs  Lab 11/27/22 1155 11/27/22 1610 11/27/22 2058 11/28/22 0203 11/28/22 0852  GLUCAP 135* 143* 208* 171* 195*   Iron Studies: No results for input(s): "IRON", "TIBC", "TRANSFERRIN", "FERRITIN" in the last 72 hours. Lab Results  Component Value Date   INR 1.1 10/19/2022   INR 1.2 03/28/2021   Studies/Results: No results found.  Medications:   amLODipine  2.5 mg Oral BID   aspirin EC  81 mg Oral q AM   atorvastatin  40 mg Oral q AM   calcium acetate  1,334 mg Oral BID WC   carvedilol  6.25 mg Oral BID WC   Chlorhexidine Gluconate Cloth  6 each Topical Q0600   Chlorhexidine Gluconate Cloth  6 each Topical Q0600   clopidogrel  75 mg Oral Daily   heparin  5,000 Units Subcutaneous Q8H   insulin aspart  0-20 Units Subcutaneous TID WC   insulin aspart  0-5 Units Subcutaneous QHS   insulin aspart  10 Units Intravenous Once   insulin glargine-yfgn  60 Units Subcutaneous BID   metolazone  20 mg Oral Daily   nortriptyline  20 mg Oral QHS   pantoprazole  40 mg Oral QAC breakfast   pregabalin  75 mg Oral q AM   sertraline  100 mg Oral q AM   sodium chloride flush  3 mL Intravenous Q12H   tamsulosin  0.4 mg Oral Daily   torsemide  100 mg Oral BID    Dialysis Orders: Most recent orders from [facility] ([facility] on 11/11/22) >  3h 43min    129kg  Hep 2000  P4   RUA AVG  Home meds include - amlodipine 10, asa, lipitor, phoslo 2 ac tid, coreg 12.5 bid, plavix, valium, insulin regular, zaroxolyn 20 qd, pamelor, protonix, lyrica, sertraline, flomax, demadex, trazodone, prns/ vits/ supps   Assessment/Plan: Vol overload - pt was released/ discharged from his OP HD unit 12/15 due to behavior issues. Last HD 2 wks ago. Was on bipap briefly, is off now.  Had HD here 12/29 and 12/30, but the UF wasn't good the 1st time due to low BP's. BP's were much better for 2nd HD and got 2.1 L (goal 3.5) off.  Most of his vol overload is in the legs, not sure but may be a chronic R HF situation. Next HD here will be on Tuesday 11/29/22. Goals are to (1) to get him down to his basic O2 requirement and (2) retry the AVG. When these are completed he should be able to be dc'd.  HD access - AVG was not functional 12/28 so CCM placed temp HD cath for [place] on 12/29 and still remains in place. RUA AVG has a good bruit today. Will keep temp cath in place and attempt to use the AVG w/ next HD Tuesday.  ESRD - pt was discharged from his OP HD unit recently as above. HD here as above. After discharge, pt will have to return to hospital here for future dialysis (assuming he doesn't find another HD unit that will accept him). Per Renal Navigator's note from 12/29, patient has been denied by multiple clinics already. Will need to f/u on placement progress. HTN - we put his BP lowering on hold due to hypotension earlier this admission, will resume them cautiously today at lower dosing bid with goal of not overtreating his BP which could hamper our ability to get his volume excess under control.  Anemia esrd - Hb 9-10s, follow.  MBD ckd - CCa and phos in range, cont phoslo 2 ac as binder.  PTSD/ hx psychosis  Tobie Poet, NP [facility] 11/28/2022,9:32 AM  LOS: 3 days

## 2022-11-28 NOTE — Progress Notes (Signed)
PROGRESS NOTE    Jeffrey [name]  QVZ:[id number] DOB: [birth year] DOA: 11/23/2022 PCP: Kristie Cowman, MD    Brief Narrative:  Jeffrey [name] is a 62 y.o. male with medical history significant of ESRD, diabetes, degenerative disc disease status post vertebral fusions, orthopedic surgeries, chronic pain, PTSD, mood disorder, depression, anxiety, OSA, anemia, COPD, CHF, BPH, pulmonary hypertension, anaphylaxis, hyperlipidemia, obesity presenting with shortness of breath, fatigue, nausea, body aches -all exacerbated by medical noncompliance.  Symptoms worsening over the past 7 days prior to admission after missing at least 1 week of dialysis with worsening shortness of breath, orthopnea and dyspnea with exertion.  He is on chronic oxygen at home for the past several weeks at 3 L.  Shortness of breath and fatigue worsens with activity improves with rest and oxygen. TRH called for admission.   Assessment and Plan:  Volume overload ESRD Hypocalcemia Uremia Acute on chronic respiratory failure with hypoxia > Discharged from his OP HD unit 12/15 due to behavior issues per report.  > Has been on chronic oxygen for at least the past several weeks at 3 L.-- initially BIPAP dependent - now weaning [place] oxygen as tolerated - down to 5L [place] today at rest. > Nephrology following - HD per temp catheter placed by PCCM  > Will likely need definitive access - Vascular consulted - likely to attempt grafting/intervention next week pending further workup/imaging. - I/O net negative - 4kg weight loss since intake   Abnormal chest x-ray Fatigue and bodyaches Sepsis ruled out > Questionable early pneumonia on chest x-ray patient received ceftriaxone and azithromycin x1 in the ED. > Likely secondary to volume overload given patient was negative for flu COVID and RSV. - Hold off on further antibiotics and follow clinically -Remains afebrile without leukocytosis   Anemia, likely chronic anemia of chronic disease >  Hemoglobin near baseline at 9.6 also degree of hemodilution likely.   - Trend CBC   Insulin-dependent diabetes, uncontrolled with hyperglycemia  -Continue sliding scale insulin, hypoglycemic protocol -Continue glargine 60 units twice daily -A1c 10.5 - discussed need for dietary/medication compliance  Degenerative disc disease Chronic pain > Status post multiple surgical interventions. - Continue home nortriptyline, Lyrica   PTSD Mood disorder Depression - Continue home sertraline, nortriptyline, diazepam, trazodone   OSA - CPAP ordered - patient noncompliant overnight   COPD without acute exacerbation - History of this listed in chart, do not see any maintenance inhalers. - Continue as needed albuterol   Acute on chronic Diastolic CHF > Listed in chart.  Last echo showed EF 32-95%, normal diastolic function, normal RV function. > Volume issues appear to be related to renal disease and missed dialysis only. - Volume management with dialysis - continue metolazone/torsemide per med rec   Hypertension - Continue home amlodipine, carvedilol - Torsemide, metolazone per renal   Chronic arthritis and L shoulder pain -Patient indicates he is following outpatient with orthopedic surgery and was previously planned for shoulder surgery but had not followed up -Continue OTC/prior pain medications, no indication for escalation of narcotics IV or otherwise at this point  Hyperlipidemia - Continue home atorvastatin   Obesity Estimated body mass index is 57.31 kg/m as calculated from the following:   Height as of 10/31/22: 5' (1.524 m).   Weight as of this encounter: 133.1 kg.     DVT prophylaxis: heparin injection 5,000 Units Start: 11/24/22 1630    Code Status: Full Code   Disposition Plan:  Level of care: Progressive Status is: Inpt The patient  will require care spanning > 2 midnights and should be moved to inpatient because: needs HD    Consultants:  Renal PCCM (for HD  access)  Subjective: No acute issues or events overnight respiratory status improving denies nausea vomiting diarrhea constipation headache fevers chills or chest pain  Objective: Vitals:   11/27/22 1900 11/28/22 0000 11/28/22 0355 11/28/22 0500  BP: (!) 171/71 (!) 147/65 (!) 158/64 (!) 171/65  Pulse: 85 87 88 86  Resp: 17  18 14   Temp: 98.2 F (36.8 C) 98 F (36.7 C) 97.8 F (36.6 C) 97.8 F (36.6 C)  TempSrc: Oral Oral Axillary Oral  SpO2: 95% 93% 93% 96%  Weight:        Intake/Output Summary (Last 24 hours) at 11/28/2022 0818 Last data filed at 11/27/2022 1900 Gross per 24 hour  Intake --  Output 1300 ml  Net -1300 ml    Filed Weights   11/25/22 1603 11/25/22 2028 11/26/22 1730  Weight: (!) 137.6 kg (!) 136.7 kg 133.1 kg    Examination:   General: Appearance:    Severely obese male in no acute distress     Lungs:     On bipap-- diminished at bases  Heart:    Normal heart rate.       Neurologic:   Awake, alert.        Data Reviewed: I have personally reviewed following labs and imaging studies  CBC: Recent Labs  Lab 11/24/22 1414 11/24/22 1422 11/25/22 0218 11/26/22 1748 11/27/22 0537  WBC 7.3  --  8.0 7.0 5.7  NEUTROABS 6.3  --   --   --   --   HGB 9.6* 10.2* 9.1* 9.4* 9.9*  HCT 30.5* 30.0* 28.6* 29.5* 31.1*  MCV 91.0  --  89.4 91.0 89.4  PLT 120*  --  126* 127* 105*    Basic Metabolic Panel: Recent Labs  Lab 11/24/22 1414 11/24/22 1422 11/25/22 0218 11/25/22 1611 11/26/22 1748 11/27/22 0537  NA 130* 130* 133* 140 135 138  K 4.2 4.2 4.5 4.0 3.7 3.5  CL 90* 89* 91* 96* 96* 98  CO2 25  --  28 31 29 28   GLUCOSE 667* 675* 504* 121* 181* 185*  BUN 119* >130* 120* 123* 72* 73*  CREATININE 5.24* 5.50* 5.52* 5.80* 4.20* 4.02*  CALCIUM 7.0*  --  7.2* 7.3* 7.6* 7.7*  PHOS  --   --  5.5* 5.6* 4.3  --     GFR: Estimated Creatinine Clearance: 22.4 mL/min (A) (by C-G formula based on SCr of 4.02 mg/dL (H)). Liver Function Tests: Recent Labs   Lab 11/24/22 1414 11/25/22 0218 11/25/22 1611 11/26/22 1748  AST 11*  --   --   --   ALT 15  --   --   --   ALKPHOS 68  --   --   --   BILITOT 0.5  --   --   --   PROT 7.0  --   --   --   ALBUMIN 3.1* 3.0* 3.0* 3.0*    No results for input(s): "LIPASE", "AMYLASE" in the last 168 hours. No results for input(s): "AMMONIA" in the last 168 hours. Coagulation Profile: No results for input(s): "INR", "PROTIME" in the last 168 hours. Cardiac Enzymes: No results for input(s): "CKTOTAL", "CKMB", "CKMBINDEX", "TROPONINI" in the last 168 hours. BNP (last 3 results) No results for input(s): "PROBNP" in the last 8760 hours. HbA1C: No results for input(s): "HGBA1C" in the last 72 hours.  CBG: Recent Labs  Lab 11/27/22 0832 11/27/22 1155 11/27/22 1610 11/27/22 2058 11/28/22 0203  GLUCAP 156* 135* 143* 208* 171*    Lipid Profile: No results for input(s): "CHOL", "HDL", "LDLCALC", "TRIG", "CHOLHDL", "LDLDIRECT" in the last 72 hours. Thyroid Function Tests: No results for input(s): "TSH", "T4TOTAL", "FREET4", "T3FREE", "THYROIDAB" in the last 72 hours. Anemia Panel: No results for input(s): "VITAMINB12", "FOLATE", "FERRITIN", "TIBC", "IRON", "RETICCTPCT" in the last 72 hours. Sepsis Labs: Recent Labs  Lab 11/24/22 1414  LATICACIDVEN 1.1     Recent Results (from the past 240 hour(s))  Blood culture (routine x 2)     Status: None (Preliminary result)   Collection Time: 11/24/22 12:46 PM   Specimen: BLOOD  Result Value Ref Range Status   Specimen Description BLOOD RIGHT ANTECUBITAL  Final   Special Requests   Final    BOTTLES DRAWN AEROBIC AND ANAEROBIC Blood Culture results may not be optimal due to an inadequate volume of blood received in culture bottles   Culture   Final    NO GROWTH 4 DAYS Performed at [facility], [place] [address], [place], [place] [address]    Report Status PENDING  Incomplete  Resp panel by RT-PCR (RSV, Flu A&B, Covid) Anterior Nasal Swab      Status: None   Collection Time: 11/24/22 12:47 PM   Specimen: Anterior Nasal Swab  Result Value Ref Range Status   SARS Coronavirus 2 by RT PCR NEGATIVE NEGATIVE Final    Comment: (NOTE) SARS-CoV-2 target nucleic acids are NOT DETECTED.  The SARS-CoV-2 RNA is generally detectable in upper respiratory specimens during the acute phase of infection. The lowest concentration of SARS-CoV-2 viral copies this assay can detect is 138 copies/mL. A negative result does not preclude SARS-Cov-2 infection and should not be used as the sole basis for treatment or other patient management decisions. A negative result may occur with  improper specimen collection/handling, submission of specimen other than nasopharyngeal swab, presence of viral mutation(s) within the areas targeted by this assay, and inadequate number of viral copies(<138 copies/mL). A negative result must be combined with clinical observations, patient history, and epidemiological information. The expected result is Negative.  Fact Sheet for Patients:  EntrepreneurPulse.com.au  Fact Sheet for Healthcare Providers:  IncredibleEmployment.be  This test is no t yet approved or cleared by the [place] FDA and  has been authorized for detection and/or diagnosis of SARS-CoV-2 by FDA under an Emergency Use Authorization (EUA). This EUA will remain  in effect (meaning this test can be used) for the duration of the COVID-19 declaration under Section 564(b)(1) of the Act, 21 U.S.C.section 360bbb-3(b)(1), unless the authorization is terminated  or revoked sooner.       Influenza A by PCR NEGATIVE NEGATIVE Final   Influenza B by PCR NEGATIVE NEGATIVE Final    Comment: (NOTE) The Xpert Xpress SARS-CoV-2/FLU/RSV plus assay is intended as an aid in the diagnosis of influenza from Nasopharyngeal swab specimens and should not be used as a sole basis for treatment. Nasal washings and aspirates are  unacceptable for Xpert Xpress SARS-CoV-2/FLU/RSV testing.  Fact Sheet for Patients: EntrepreneurPulse.com.au  Fact Sheet for Healthcare Providers: IncredibleEmployment.be  This test is not yet approved or cleared by the [place] FDA and has been authorized for detection and/or diagnosis of SARS-CoV-2 by FDA under an Emergency Use Authorization (EUA). This EUA will remain in effect (meaning this test can be used) for the duration of the COVID-19 declaration under Section 564(b)(1) of  the Act, 21 U.S.C. section 360bbb-3(b)(1), unless the authorization is terminated or revoked.     Resp Syncytial Virus by PCR NEGATIVE NEGATIVE Final    Comment: (NOTE) Fact Sheet for Patients: EntrepreneurPulse.com.au  Fact Sheet for Healthcare Providers: IncredibleEmployment.be  This test is not yet approved or cleared by the [place] FDA and has been authorized for detection and/or diagnosis of SARS-CoV-2 by FDA under an Emergency Use Authorization (EUA). This EUA will remain in effect (meaning this test can be used) for the duration of the COVID-19 declaration under Section 564(b)(1) of the Act, 21 U.S.C. section 360bbb-3(b)(1), unless the authorization is terminated or revoked.  Performed at [facility], [place] [address] [place], [place] [address]   Blood culture (routine x 2)     Status: None (Preliminary result)   Collection Time: 11/25/22  2:18 AM   Specimen: BLOOD LEFT ARM  Result Value Ref Range Status   Specimen Description BLOOD LEFT ARM  Final   Special Requests   Final    BOTTLES DRAWN AEROBIC AND ANAEROBIC Blood Culture results may not be optimal due to an excessive volume of blood received in culture bottles   Culture   Final    NO GROWTH 3 DAYS Performed at [facility], [place] [address], [place],  [place] [address]    Report Status PENDING  Incomplete         Radiology  Studies: No results found.      Scheduled Meds:  amLODipine  2.5 mg Oral BID   aspirin EC  81 mg Oral q AM   atorvastatin  40 mg Oral q AM   calcium acetate  1,334 mg Oral BID WC   carvedilol  6.25 mg Oral BID WC   Chlorhexidine Gluconate Cloth  6 each Topical Q0600   Chlorhexidine Gluconate Cloth  6 each Topical Q0600   clopidogrel  75 mg Oral Daily   heparin  5,000 Units Subcutaneous Q8H   insulin aspart  0-20 Units Subcutaneous TID WC   insulin aspart  0-5 Units Subcutaneous QHS   insulin aspart  10 Units Intravenous Once   insulin glargine-yfgn  60 Units Subcutaneous BID   metolazone  20 mg Oral Daily   nortriptyline  20 mg Oral QHS   pantoprazole  40 mg Oral QAC breakfast   pregabalin  75 mg Oral q AM   sertraline  100 mg Oral q AM   sodium chloride flush  3 mL Intravenous Q12H   tamsulosin  0.4 mg Oral Daily   torsemide  100 mg Oral BID   Continuous Infusions:   LOS: 3 days    Time spent: 35 minutes spent on chart review, discussion with nursing staff, consultants, updating family and interview/physical exam; more than 50% of that time was spent in counseling and/or coordination of care.    Little Ishikawa, DO [facility] Available via Epic secure chat 7am-7pm After these hours, please refer to coverage provider listed on [email or web address] 11/28/2022, 8:18 AM

## 2022-11-28 NOTE — Progress Notes (Signed)
Transition of Care Department [facility]) following patient for high risk of readmission.   62 y.o. male with medical history significant of ESRD, diabetes, degenerative disc disease status post vertebral fusions, orthopedic surgeries, chronic pain, PTSD, mood disorder, depression, anxiety, OSA, anemia, COPD, CHF, BPH, pulmonary hypertension, anaphylaxis, hyperlipidemia, obesity presenting with shortness of breath, fatigue, nausea, body aches -all exacerbated by medical noncompliance.  Symptoms worsening over the past 7 days prior to admission after missing at least 1 week of dialysis with worsening shortness of breath, orthopnea and dyspnea with exertion.  He is on chronic oxygen (with [employer]) at home for the past several weeks at 3 L.  Shortness of breath and fatigue worsens with activity improves with rest and oxygen.   Transition of Care Department [facility]) has reviewed patient and we will continue to monitor patient advancement through interdisciplinary progression rounds.

## 2022-11-29 DIAGNOSIS — I5032 Chronic diastolic (congestive) heart failure: Secondary | ICD-10-CM | POA: Diagnosis not present

## 2022-11-29 DIAGNOSIS — N186 End stage renal disease: Secondary | ICD-10-CM | POA: Diagnosis not present

## 2022-11-29 DIAGNOSIS — E8779 Other fluid overload: Secondary | ICD-10-CM | POA: Diagnosis not present

## 2022-11-29 LAB — CBC
HCT: 30.4 % — ABNORMAL LOW (ref 39.0–52.0)
Hemoglobin: 9.2 g/dL — ABNORMAL LOW (ref 13.0–17.0)
MCH: 27.4 pg (ref 26.0–34.0)
MCHC: 30.3 g/dL (ref 30.0–36.0)
MCV: 90.5 fL (ref 80.0–100.0)
Platelets: 127 10*3/uL — ABNORMAL LOW (ref 150–400)
RBC: 3.36 MIL/uL — ABNORMAL LOW (ref 4.22–5.81)
RDW: 14 % (ref 11.5–15.5)
WBC: 5.7 10*3/uL (ref 4.0–10.5)
nRBC: 0 % (ref 0.0–0.2)

## 2022-11-29 LAB — GLUCOSE, CAPILLARY
Glucose-Capillary: 66 mg/dL — ABNORMAL LOW (ref 70–99)
Glucose-Capillary: 205 mg/dL — ABNORMAL HIGH (ref 70–99)
Glucose-Capillary: 183 mg/dL — ABNORMAL HIGH (ref 70–99)
Glucose-Capillary: 295 mg/dL — ABNORMAL HIGH (ref 70–99)

## 2022-11-29 LAB — BASIC METABOLIC PANEL
Anion gap: 13 (ref 5–15)
BUN: 90 mg/dL — ABNORMAL HIGH (ref 8–23)
CO2: 29 mmol/L (ref 22–32)
Calcium: 8.2 mg/dL — ABNORMAL LOW (ref 8.9–10.3)
Chloride: 90 mmol/L — ABNORMAL LOW (ref 98–111)
Creatinine, Ser: 4.33 mg/dL — ABNORMAL HIGH (ref 0.61–1.24)
GFR, Estimated: 15 mL/min — ABNORMAL LOW (ref >60)
Glucose, Bld: 243 mg/dL — ABNORMAL HIGH (ref 70–99)
Potassium: 3.6 mmol/L (ref 3.5–5.1)
Sodium: 132 mmol/L — ABNORMAL LOW (ref 135–145)

## 2022-11-29 LAB — CULTURE, BLOOD (ROUTINE X 2): Culture: NO GROWTH

## 2022-11-29 LAB — PHOSPHORUS: Phosphorus: 5.7 mg/dL — ABNORMAL HIGH (ref 2.5–4.6)

## 2022-11-29 MED ORDER — DARBEPOETIN ALFA 60 MCG/0.3ML IJ SOSY
60.0000 ug | PREFILLED_SYRINGE | INTRAMUSCULAR | Status: DC
  Administered 2022-11-29: 60 ug via SUBCUTANEOUS
  Filled 2022-11-29 (×2): qty 0.3

## 2022-11-29 NOTE — Progress Notes (Signed)
Pt has been denied for multiple out-pt HD clinics in the [place] area. Pt does not have transportation to any out-of-county clinics due to pt using medicaid transportation to/from HD (per clinic social worker). Contacted [facility] [facility] social worker to see if pt would qualify for out-pt HD and transportation to [place] HD. Will await a return call. Navigator was advised by pt's previous clinic that pt may have VA benefits for out-pt HD. Will assist as needed.   Melven Sartorius Renal Navigator [phone]

## 2022-11-29 NOTE — Progress Notes (Signed)
PROGRESS NOTE    Jeffrey [name]  JTT:[id number] DOB: [birth year] DOA: 11/23/2022 PCP: Kristie Cowman, MD    Brief Narrative:  Jeffrey [name] is a 62 y.o. male with medical history significant of ESRD, diabetes, degenerative disc disease status post vertebral fusions, orthopedic surgeries, chronic pain, PTSD, mood disorder, depression, anxiety, OSA, anemia, COPD, CHF, BPH, pulmonary hypertension, anaphylaxis, hyperlipidemia, obesity presenting with shortness of breath, fatigue, nausea, body aches -all exacerbated by medical noncompliance.  Symptoms worsening over the past 7 days prior to admission after missing at least 1 week of dialysis with worsening shortness of breath, orthopnea and dyspnea with exertion.  He is on chronic oxygen at home for the past several weeks at 3 L.  Shortness of breath and fatigue worsens with activity improves with rest and oxygen. TR[facility] called for admission.   Assessment and Plan:  Volume overload ESRD Hypocalcemia Uremia Acute on chronic respiratory failure with hypoxia > Discharged from his OP HD unit 12/15 due to behavior issues per report.  > Has been on chronic oxygen for at least the past several weeks at 3 L.-- initially BIPAP dependent - now weaning [place] oxygen as tolerated - down to 5L [place] today at rest. > Nephrology following - HD per temp catheter placed by PCCM  > Will likely need definitive access - Vascular consulted given history of issues with current graft - likely to attempt intervention next week pending access issues - Daily weights, I/O net negative    Abnormal chest x-ray Fatigue and bodyaches Sepsis ruled out > Questionable early pneumonia on chest x-ray patient received ceftriaxone and azithromycin x1 in the ED. > Likely secondary to volume overload given patient was negative for flu COVID and RSV. - Hold off on further antibiotics and follow clinically -Remains afebrile without leukocytosis   Anemia, likely chronic anemia of chronic disease >  Hemoglobin near baseline at 9.6 also degree of hemodilution likely.   - Trend CBC   Insulin-dependent diabetes, uncontrolled with hyperglycemia  -Continue sliding scale insulin, hypoglycemic protocol -Continue glargine 60 units twice daily -A1c 10.5 - discussed need for dietary/medication compliance  Degenerative disc disease Chronic pain > Status post multiple surgical interventions. - Continue home nortriptyline, Lyrica   PTSD Mood disorder Depression - Continue home sertraline, nortriptyline, diazepam, trazodone   OSA - CPAP ordered - patient noncompliant overnight   COPD without acute exacerbation - History of this listed in chart, do not see any maintenance inhalers. - Continue as needed albuterol   Acute on chronic Diastolic CHF > Listed in chart.  Last echo showed EF 00-92%, normal diastolic function, normal RV function. > Volume issues appear to be related to renal disease and missed dialysis only. - Volume management with dialysis - continue metolazone/torsemide per med rec   Hypertension - Continue home amlodipine, carvedilol - Torsemide, metolazone per renal   Chronic arthritis and L shoulder pain -Patient indicates he is following outpatient with orthopedic surgery and was previously planned for shoulder surgery but had not followed up -Continue OTC/prior pain medications, no indication for escalation of narcotics IV or otherwise at this point  Hyperlipidemia - Continue home atorvastatin   Obesity Estimated body mass index is 57.31 kg/m as calculated from the following:   Height as of 10/31/22: 5' (1.524 m).   Weight as of this encounter: 133.1 kg.     DVT prophylaxis: heparin injection 5,000 Units Start: 11/24/22 1630    Code Status: Full Code   Disposition Plan:  Level of care: Progressive Status  is: Inpt The patient will require care spanning > 2 midnights and should be moved to inpatient because: needs HD    Consultants:  Renal PCCM (for HD  access)  Subjective: No acute issues or events overnight respiratory status improving denies nausea vomiting diarrhea constipation headache fevers chills or chest pain  Objective: Vitals:   11/28/22 0500 11/28/22 2011 11/29/22 0000 11/29/22 0430  BP: (!) 171/65 (!) 161/56 (!) 157/67 (!) 137/55  Pulse: 86 90 86 81  Resp: 14 18 12 12   Temp: 97.8 F (36.6 C) 98.4 F (36.9 C) 97.9 F (36.6 C) 97.7 F (36.5 C)  TempSrc: Oral Oral Axillary Axillary  SpO2: 96% 95% 96% 94%  Weight:        Intake/Output Summary (Last 24 hours) at 11/29/2022 0757 Last data filed at 11/28/2022 0900 Gross per 24 hour  Intake --  Output 1200 ml  Net -1200 ml    Filed Weights   11/25/22 1603 11/25/22 2028 11/26/22 1730  Weight: (!) 137.6 kg (!) 136.7 kg 133.1 kg    Examination:   General: Appearance:    Severely obese male in no acute distress     Lungs:     On bipap-- diminished at bases  Heart:    Normal heart rate.       Neurologic:   Awake, alert.        Data Reviewed: I have personally reviewed following labs and imaging studies  CBC: Recent Labs  Lab 11/24/22 1414 11/24/22 1422 11/25/22 0218 11/26/22 1748 11/27/22 0537  WBC 7.3  --  8.0 7.0 5.7  NEUTROABS 6.3  --   --   --   --   HGB 9.6* 10.2* 9.1* 9.4* 9.9*  HCT 30.5* 30.0* 28.6* 29.5* 31.1*  MCV 91.0  --  89.4 91.0 89.4  PLT 120*  --  126* 127* 105*    Basic Metabolic Panel: Recent Labs  Lab 11/24/22 1414 11/24/22 1422 11/25/22 0218 11/25/22 1611 11/26/22 1748 11/27/22 0537  NA 130* 130* 133* 140 135 138  K 4.2 4.2 4.5 4.0 3.7 3.5  CL 90* 89* 91* 96* 96* 98  CO2 25  --  28 31 29 28   GLUCOSE 667* 675* 504* 121* 181* 185*  BUN 119* >130* 120* 123* 72* 73*  CREATININE 5.24* 5.50* 5.52* 5.80* 4.20* 4.02*  CALCIUM 7.0*  --  7.2* 7.3* 7.6* 7.7*  PHOS  --   --  5.5* 5.6* 4.3  --     GFR: Estimated Creatinine Clearance: 22.4 mL/min (A) (by C-G formula based on SCr of 4.02 mg/dL (H)). Liver Function Tests: Recent  Labs  Lab 11/24/22 1414 11/25/22 0218 11/25/22 1611 11/26/22 1748  AST 11*  --   --   --   ALT 15  --   --   --   ALKPHOS 68  --   --   --   BILITOT 0.5  --   --   --   PROT 7.0  --   --   --   ALBUMIN 3.1* 3.0* 3.0* 3.0*    No results for input(s): "LIPASE", "AMYLASE" in the last 168 hours. No results for input(s): "AMMONIA" in the last 168 hours. Coagulation Profile: No results for input(s): "INR", "PROTIME" in the last 168 hours. Cardiac Enzymes: No results for input(s): "CKTOTAL", "CKMB", "CKMBINDEX", "TROPONINI" in the last 168 hours. BNP (last 3 results) No results for input(s): "PROBNP" in the last 8760 hours. HbA1C: No results for input(s): "HGBA1C" in the  last 72 hours.  CBG: Recent Labs  Lab 11/27/22 2058 11/28/22 0203 11/28/22 0852 11/28/22 1210 11/28/22 2054  GLUCAP 208* 171* 195* 206* 230*    Lipid Profile: No results for input(s): "CHOL", "HDL", "LDLCALC", "TRIG", "CHOLHDL", "LDLDIRECT" in the last 72 hours. Thyroid Function Tests: No results for input(s): "TSH", "T4TOTAL", "FREET4", "T3FREE", "THYROIDAB" in the last 72 hours. Anemia Panel: No results for input(s): "VITAMINB12", "FOLATE", "FERRITIN", "TIBC", "IRON", "RETICCTPCT" in the last 72 hours. Sepsis Labs: Recent Labs  Lab 11/24/22 1414  LATICACIDVEN 1.1     Recent Results (from the past 240 hour(s))  Blood culture (routine x 2)     Status: None (Preliminary result)   Collection Time: 11/24/22 12:46 PM   Specimen: BLOOD  Result Value Ref Range Status   Specimen Description BLOOD RIGHT ANTECUBITAL  Final   Special Requests   Final    BOTTLES DRAWN AEROBIC AND ANAEROBIC Blood Culture results may not be optimal due to an inadequate volume of blood received in culture bottles   Culture   Final    NO GROWTH 4 DAYS Performed at [facility], [place] [address], [place], [place] [address]    Report Status PENDING  Incomplete  Resp panel by RT-PCR (RSV, Flu A&B, Covid) Anterior Nasal Swab      Status: None   Collection Time: 11/24/22 12:47 PM   Specimen: Anterior Nasal Swab  Result Value Ref Range Status   SARS Coronavirus 2 by RT PCR NEGATIVE NEGATIVE Final    Comment: (NOTE) SARS-CoV-2 target nucleic acids are NOT DETECTED.  The SARS-CoV-2 RNA is generally detectable in upper respiratory specimens during the acute phase of infection. The lowest concentration of SARS-CoV-2 viral copies this assay can detect is 138 copies/mL. A negative result does not preclude SARS-Cov-2 infection and should not be used as the sole basis for treatment or other patient management decisions. A negative result may occur with  improper specimen collection/handling, submission of specimen other than nasopharyngeal swab, presence of viral mutation(s) within the areas targeted by this assay, and inadequate number of viral copies(<138 copies/mL). A negative result must be combined with clinical observations, patient history, and epidemiological information. The expected result is Negative.  Fact Sheet for Patients:  EntrepreneurPulse.com.au  Fact Sheet for Healthcare Providers:  IncredibleEmployment.be  This test is no t yet approved or cleared by the [place] FDA and  has been authorized for detection and/or diagnosis of SARS-CoV-2 by FDA under an Emergency Use Authorization (EUA). This EUA will remain  in effect (meaning this test can be used) for the duration of the COVID-19 declaration under Section 564(b)(1) of the Act, 21 U.S.C.section 360bbb-3(b)(1), unless the authorization is terminated  or revoked sooner.       Influenza A by PCR NEGATIVE NEGATIVE Final   Influenza B by PCR NEGATIVE NEGATIVE Final    Comment: (NOTE) The Xpert Xpress SARS-CoV-2/FLU/RSV plus assay is intended as an aid in the diagnosis of influenza from Nasopharyngeal swab specimens and should not be used as a sole basis for treatment. Nasal washings and aspirates are  unacceptable for Xpert Xpress SARS-CoV-2/FLU/RSV testing.  Fact Sheet for Patients: EntrepreneurPulse.com.au  Fact Sheet for Healthcare Providers: IncredibleEmployment.be  This test is not yet approved or cleared by the [place] FDA and has been authorized for detection and/or diagnosis of SARS-CoV-2 by FDA under an Emergency Use Authorization (EUA). This EUA will remain in effect (meaning this test can be used) for the duration of the COVID-19 declaration  under Section 564(b)(1) of the Act, 21 U.S.C. section 360bbb-3(b)(1), unless the authorization is terminated or revoked.     Resp Syncytial Virus by PCR NEGATIVE NEGATIVE Final    Comment: (NOTE) Fact Sheet for Patients: EntrepreneurPulse.com.au  Fact Sheet for Healthcare Providers: IncredibleEmployment.be  This test is not yet approved or cleared by the [place] FDA and has been authorized for detection and/or diagnosis of SARS-CoV-2 by FDA under an Emergency Use Authorization (EUA). This EUA will remain in effect (meaning this test can be used) for the duration of the COVID-19 declaration under Section 564(b)(1) of the Act, 21 U.S.C. section 360bbb-3(b)(1), unless the authorization is terminated or revoked.  Performed at [facility], [place] [address], [place], [place] [address]   Blood culture (routine x 2)     Status: None (Preliminary result)   Collection Time: 11/25/22  2:18 AM   Specimen: BLOOD LEFT ARM  Result Value Ref Range Status   Specimen Description BLOOD LEFT ARM  Final   Special Requests   Final    BOTTLES DRAWN AEROBIC AND ANAEROBIC Blood Culture results may not be optimal due to an excessive volume of blood received in culture bottles   Culture   Final    NO GROWTH 3 DAYS Performed at [facility], [place] [address], [place], [place] [address]    Report Status PENDING  Incomplete         Radiology  Studies: No results found.      Scheduled Meds:  amLODipine  2.5 mg Oral BID   aspirin EC  81 mg Oral q AM   atorvastatin  40 mg Oral q AM   calcium acetate  1,334 mg Oral BID WC   carvedilol  6.25 mg Oral BID WC   Chlorhexidine Gluconate Cloth  6 each Topical Q0600   clopidogrel  75 mg Oral Daily   heparin  5,000 Units Subcutaneous Q8H   insulin aspart  0-20 Units Subcutaneous TID WC   insulin aspart  0-5 Units Subcutaneous QHS   insulin aspart  10 Units Intravenous Once   insulin glargine-yfgn  60 Units Subcutaneous BID   metolazone  20 mg Oral Daily   nortriptyline  20 mg Oral QHS   pantoprazole  40 mg Oral QAC breakfast   pregabalin  75 mg Oral q AM   sertraline  100 mg Oral q AM   sodium chloride flush  3 mL Intravenous Q12H   tamsulosin  0.4 mg Oral Daily   torsemide  100 mg Oral BID   Continuous Infusions:   LOS: 4 days    Time spent: 35 minutes spent on chart review, discussion with nursing staff, consultants, updating family and interview/physical exam; more than 50% of that time was spent in counseling and/or coordination of care.    Little Ishikawa, DO [facility] Available via Epic secure chat 7am-7pm After these hours, please refer to coverage provider listed on [email or web address] 11/29/2022, 7:57 AM

## 2022-11-29 NOTE — Progress Notes (Signed)
Received patient in bed to unit.  Alert and oriented.  Informed consent signed and in chart.   Treatment initiated: 1020 Treatment completed: 1350  Patient tolerated well.  Transported back to the room  Alert, without acute distress.  Hand-off given to patient's nurse.   Access used: AVG Access issues: none  Total UF removed: 2.7 L  Medication(s) given: Midodrine 10 mg PO Post HD VS: 139/53 P 88 R 16 Post HD weight: 130.4 KG   [facility]

## 2022-11-29 NOTE — Inpatient Diabetes Management (Signed)
Inpatient Diabetes Program Recommendations  AACE/ADA: New Consensus Statement on Inpatient Glycemic Control (2015)  Target Ranges:  Prepandial:   less than 140 mg/dL      Peak postprandial:   less than 180 mg/dL (1-2 hours)      Critically ill patients:  140 - 180 mg/dL   Lab Results  Component Value Date   GLUCAP 183 (H) 11/29/2022   HGBA1C 10.5 (H) 11/25/2022    Review of Glycemic Control  Diabetes history: DM 2 Outpatient Diabetes medications: Humulin R U-500 140 units bid (!/2 dose in the am on HD days TTSat)  Current orders for Inpatient glycemic control:  Semglee 60 units bid Novolog 0-20 tid + hs  A1c 10.5% on 12/29  Spoke with pt at bedside regarding A1c of 10.5 and glucose control at home. Pt sees [facility] and has his next visit on 1/24. Pt sees his Endocrinologist every 3 months. . Per his PCP notes. The pts A1c is chronically around a 10% and pt confirms this. Pt reports that his glucose is difficult to manage due to his HD days and potential for hypoglycemia. He states at his last Endo visit no adjustments were made to his insulin doses. Encouraged follow up with his Endocrinologist.  Thanks,  Tama Headings RN, MSN, BC-ADM Inpatient Diabetes Coordinator Team Pager [phone] (8a-5p)

## 2022-11-29 NOTE — Care Management Important Message (Signed)
Important Message  Patient Details  Name: Jeffrey [name] MRN: [id number] Date of Birth: Oct 13, 1961   Medicare Important Message Given:  Yes     Noal Abshier 11/29/2022, 3:24 PM

## 2022-11-29 NOTE — Plan of Care (Signed)
  Problem: Education: Goal: Understanding of CV disease, CV risk reduction, and recovery process will improve Outcome: Progressing   Problem: Activity: Goal: Ability to return to baseline activity level will improve Outcome: Progressing   Problem: Education: Goal: Ability to describe self-care measures that may prevent or decrease complications (Diabetes Survival Skills Education) will improve Outcome: Progressing   Problem: Activity: Goal: Risk for activity intolerance will decrease Outcome: Progressing

## 2022-11-29 NOTE — Progress Notes (Addendum)
  Progress Note    11/29/2022 7:32 AM 4 Days Post-Op  Subjective:  no complaints    Vitals:   11/29/22 0000 11/29/22 0430  BP: (!) 157/67 (!) 137/55  Pulse: 86 81  Resp: 12 12  Temp: 97.9 F (36.6 C) 97.7 F (36.5 C)  SpO2: 96% 94%    Physical Exam: General: NAD, on bipap  Lungs:  nonlabored Extremities:  RUE AVG with palpable thrill, good bruit on auscultation  CBC    Component Value Date/Time   WBC 5.7 11/27/2022 0537   RBC 3.48 (L) 11/27/2022 0537   HGB 9.9 (L) 11/27/2022 0537   HCT 31.1 (L) 11/27/2022 0537   PLT 105 (L) 11/27/2022 0537   MCV 89.4 11/27/2022 0537   MCH 28.4 11/27/2022 0537   MCHC 31.8 11/27/2022 0537   RDW 14.5 11/27/2022 0537   LYMPHSABS 0.3 (L) 11/24/2022 1414   MONOABS 0.5 11/24/2022 1414   EOSABS 0.1 11/24/2022 1414   BASOSABS 0.0 11/24/2022 1414    BMET    Component Value Date/Time   NA 138 11/27/2022 0537   K 3.5 11/27/2022 0537   CL 98 11/27/2022 0537   CO2 28 11/27/2022 0537   GLUCOSE 185 (H) 11/27/2022 0537   BUN 73 (H) 11/27/2022 0537   CREATININE 4.02 (H) 11/27/2022 0537   CALCIUM 7.7 (L) 11/27/2022 0537   GFRNONAA 16 (L) 11/27/2022 0537    INR    Component Value Date/Time   INR 1.1 10/19/2022 2253     Intake/Output Summary (Last 24 hours) at 11/29/2022 0732 Last data filed at 11/28/2022 0900 Gross per 24 hour  Intake --  Output 1200 ml  Net -1200 ml      Assessment/Plan:  62 y.o. male with volume overload    -CCM placed a temp cath that the patient has gotten dialysis through -Nephrology goals are to get the patient down to basic O2 and retry AVG -AVG has stronger thrill on palpation than at admission. Nephrology will attempt HD through AVG today -If patient has continued issues with AVG we will consider placement of new permanent access soon   Vicente Serene, PA-C [facility] [phone] 11/29/2022 7:32 AM   VASCULAR STAFF ADDENDUM: I have independently interviewed and examined the  patient. I agree with the above.   Yevonne Aline. Stanford Breed, MD [facility] Phone Number: [phone] 11/29/2022 1:59 PM

## 2022-11-29 NOTE — Progress Notes (Signed)
[facility] Progress Note   Subjective:    Seen in HD-  able to stick AVG-  wants to have temp HD cath removed-  no other c/o's  Objective Vitals:   11/29/22 0430 11/29/22 0816 11/29/22 1000 11/29/22 1020  BP: (!) 137/55 (!) 136/59 (!) 132/52 (!) 144/61  Pulse: 81 83 84 86  Resp: 12 12 12 20   Temp: 97.7 F (36.5 C) (!) 97.5 F (36.4 C) 98 F (36.7 C)   TempSrc: Axillary Oral Oral   SpO2: 94% 95% 95% 95%  Weight:       Physical Exam General: Awake, alert, obese, NAD Heart: S1 and S2; No MRGs Lungs: Clear anteriorly; diminished laterally  Abdomen: Obese, non-tender Extremities: BL AKA's, no edema BL stumps Dialysis Access: L temp HD cath; R AVG (+) B/T   Filed Weights   11/25/22 1603 11/25/22 2028 11/26/22 1730  Weight: (!) 137.6 kg (!) 136.7 kg 133.1 kg   No intake or output data in the 24 hours ending 11/29/22 1033   Additional Objective Labs: Basic Metabolic Panel: Recent Labs  Lab 11/25/22 0218 11/25/22 1611 11/26/22 1748 11/27/22 0537  NA 133* 140 135 138  K 4.5 4.0 3.7 3.5  CL 91* 96* 96* 98  CO2 28 31 29 28   GLUCOSE 504* 121* 181* 185*  BUN 120* 123* 72* 73*  CREATININE 5.52* 5.80* 4.20* 4.02*  CALCIUM 7.2* 7.3* 7.6* 7.7*  PHOS 5.5* 5.6* 4.3  --    Liver Function Tests: Recent Labs  Lab 11/24/22 1414 11/25/22 0218 11/25/22 1611 11/26/22 1748  AST 11*  --   --   --   ALT 15  --   --   --   ALKPHOS 68  --   --   --   BILITOT 0.5  --   --   --   PROT 7.0  --   --   --   ALBUMIN 3.1* 3.0* 3.0* 3.0*   No results for input(s): "LIPASE", "AMYLASE" in the last 168 hours. CBC: Recent Labs  Lab 11/24/22 1414 11/24/22 1422 11/25/22 0218 11/26/22 1748 11/27/22 0537  WBC 7.3  --  8.0 7.0 5.7  NEUTROABS 6.3  --   --   --   --   HGB 9.6*   < > 9.1* 9.4* 9.9*  HCT 30.5*   < > 28.6* 29.5* 31.1*  MCV 91.0  --  89.4 91.0 89.4  PLT 120*  --  126* 127* 105*   < > = values in this interval not displayed.   Blood Culture     Component Value Date/Time   SDES BLOOD LEFT ARM 11/25/2022 0218   SPECREQUEST  11/25/2022 7322    BOTTLES DRAWN AEROBIC AND ANAEROBIC Blood Culture results may not be optimal due to an excessive volume of blood received in culture bottles   CULT  11/25/2022 0218    NO GROWTH 4 DAYS Performed at [facility], [place] [address] [place], [place] [address]    REPTSTATUS PENDING 11/25/2022 7062    Cardiac Enzymes: No results for input(s): "CKTOTAL", "CKMB", "CKMBINDEX", "TROPONINI" in the last 168 hours. CBG: Recent Labs  Lab 11/28/22 0203 11/28/22 0852 11/28/22 1210 11/28/22 2054 11/29/22 0819  GLUCAP 171* 195* 206* 230* 205*   Iron Studies: No results for input(s): "IRON", "TIBC", "TRANSFERRIN", "FERRITIN" in the last 72 hours. Lab Results  Component Value Date   INR 1.1 10/19/2022   INR 1.2 03/28/2021   Studies/Results: No results found.  Medications:   amLODipine  2.5 mg Oral BID   aspirin EC  81 mg Oral q AM   atorvastatin  40 mg Oral q AM   calcium acetate  1,334 mg Oral BID WC   carvedilol  6.25 mg Oral BID WC   Chlorhexidine Gluconate Cloth  6 each Topical Q0600   clopidogrel  75 mg Oral Daily   heparin  5,000 Units Subcutaneous Q8H   insulin aspart  0-20 Units Subcutaneous TID WC   insulin aspart  0-5 Units Subcutaneous QHS   insulin aspart  10 Units Intravenous Once   insulin glargine-yfgn  60 Units Subcutaneous BID   metolazone  20 mg Oral Daily   nortriptyline  20 mg Oral QHS   pantoprazole  40 mg Oral QAC breakfast   pregabalin  75 mg Oral q AM   sertraline  100 mg Oral q AM   sodium chloride flush  3 mL Intravenous Q12H   tamsulosin  0.4 mg Oral Daily   torsemide  100 mg Oral BID    Dialysis Orders: Most recent orders from [facility] ([facility] from [facility] on 11/11/22) >  3h 80min   129kg  Hep 2000  P4   RUA AVG  Home meds include - amlodipine 10, asa, lipitor, phoslo 2 ac tid, coreg 12.5 bid, plavix, valium, insulin regular, zaroxolyn 20 qd, pamelor,  protonix, lyrica, sertraline, flomax, demadex, trazodone, prns/ vits/ supps   Assessment/Plan: Vol overload - pt was released/ discharged from his OP HD unit 12/15 due to behavior issues. Last HD 2 wks ago. Was on bipap briefly, is off now.  Had HD here 12/29 and 12/30.  Most of his vol overload is in the legs, not sure but may be a chronic R HF situation. Next HD here will be today Tuesday 11/29/22. Goals are to (1) to get him down to his basic O2 requirement and (2) retry the AVG. When these are completed he should be able to be dc'd. Used AVG today-  will put in order to remove temp line   HD access - AVG was not functional 12/28 so CCM placed temp HD cath for [place] on 12/29 and still remains in place. RUA AVG has a good bruit today. Will keep temp cath in place and attempt to use the AVG w/ next HD Tuesday. Were able to use-  will d/c temp line ESRD - pt was discharged from his OP HD unit recently as above. HD here as above. After discharge, pt will have to return to hospital here for future dialysis (assuming he doesn't find another HD unit that will accept him). Per Renal Navigator's note from 12/29, patient has been denied by multiple clinics already. Will need to f/u on placement progress. HTN - we put his BP lowering meds on hold due to hypotension earlier this admission, will resume them cautiously today at lower dosing bid with goal of not overtreating his BP which could hamper our ability to get his volume excess under control. made 1200 of urine on demedex and zaroxolyn-  cont to use to our advantage  Anemia esrd - Hb 9-10s, follow. give ESA MBD ckd - CCa and phos in range, cont phoslo 2 ac as binder.  PTSD/ hx psychosis  [facility] 11/29/2022,10:33 AM  LOS: 4 days

## 2022-11-29 NOTE — Procedures (Signed)
Patient was seen on dialysis and the procedure was supervised.  BFR 400  Via AVG BP is  112/56.   Patient appears to be tolerating treatment well  Jeffrey [name] 11/29/2022

## 2022-11-30 DIAGNOSIS — J9601 Acute respiratory failure with hypoxia: Secondary | ICD-10-CM | POA: Diagnosis not present

## 2022-11-30 DIAGNOSIS — R739 Hyperglycemia, unspecified: Secondary | ICD-10-CM | POA: Diagnosis not present

## 2022-11-30 DIAGNOSIS — N186 End stage renal disease: Secondary | ICD-10-CM | POA: Diagnosis not present

## 2022-11-30 DIAGNOSIS — J449 Chronic obstructive pulmonary disease, unspecified: Secondary | ICD-10-CM | POA: Diagnosis not present

## 2022-11-30 LAB — GLUCOSE, CAPILLARY
Glucose-Capillary: 198 mg/dL — ABNORMAL HIGH (ref 70–99)
Glucose-Capillary: 262 mg/dL — ABNORMAL HIGH (ref 70–99)
Glucose-Capillary: 367 mg/dL — ABNORMAL HIGH (ref 70–99)
Glucose-Capillary: 427 mg/dL — ABNORMAL HIGH (ref 70–99)
Glucose-Capillary: 434 mg/dL — ABNORMAL HIGH (ref 70–99)

## 2022-11-30 LAB — MRSA NEXT GEN BY PCR, NASAL: MRSA by PCR Next Gen: DETECTED — AB

## 2022-11-30 LAB — CULTURE, BLOOD (ROUTINE X 2): Culture: NO GROWTH

## 2022-11-30 LAB — HEPATITIS B CORE ANTIBODY, TOTAL: Hep B Core Total Ab: NONREACTIVE

## 2022-11-30 LAB — GLUCOSE, RANDOM: Glucose, Bld: 420 mg/dL — ABNORMAL HIGH (ref 70–99)

## 2022-11-30 MED ORDER — METOLAZONE 5 MG PO TABS
10.0000 mg | ORAL_TABLET | Freq: Every day | ORAL | Status: DC
  Administered 2022-12-01 – 2022-12-04 (×4): 10 mg via ORAL
  Filled 2022-11-30 (×5): qty 2

## 2022-11-30 MED ORDER — INSULIN REGULAR HUMAN (CONC) 500 UNIT/ML ~~LOC~~ SOPN
70.0000 [IU] | PEN_INJECTOR | Freq: Every day | SUBCUTANEOUS | Status: DC
  Filled 2022-11-30: qty 3

## 2022-11-30 MED ORDER — INSULIN REGULAR HUMAN (CONC) 500 UNIT/ML ~~LOC~~ SOPN
70.0000 [IU] | PEN_INJECTOR | Freq: Two times a day (BID) | SUBCUTANEOUS | Status: DC
  Administered 2022-11-30 – 2022-12-02 (×4): 70 [IU] via SUBCUTANEOUS

## 2022-11-30 MED ORDER — HYDROMORPHONE HCL 1 MG/ML IJ SOLN
0.5000 mg | INTRAMUSCULAR | Status: DC | PRN
  Administered 2022-11-30 – 2022-12-01 (×3): 0.5 mg via INTRAVENOUS
  Filled 2022-11-30 (×3): qty 0.5

## 2022-11-30 MED ORDER — CYCLOBENZAPRINE HCL 5 MG PO TABS
5.0000 mg | ORAL_TABLET | Freq: Three times a day (TID) | ORAL | Status: DC | PRN
  Administered 2022-11-30 – 2022-12-02 (×4): 5 mg via ORAL
  Filled 2022-11-30 (×4): qty 1

## 2022-11-30 MED ORDER — METHOCARBAMOL 500 MG PO TABS
250.0000 mg | ORAL_TABLET | ORAL | Status: AC
  Administered 2022-11-30: 250 mg via ORAL
  Filled 2022-11-30: qty 1

## 2022-11-30 MED ORDER — CHLORHEXIDINE GLUCONATE CLOTH 2 % EX PADS: 6.0000 | MEDICATED_PAD | Freq: Every day | CUTANEOUS | Status: DC

## 2022-11-30 NOTE — Progress Notes (Signed)
Patient has anxiety due to severe muscle spasms of both upper and lower extremities. Vital signs: BP: 166/66, HR: 88, RR: 24, O2 sat: 96%. CBG: 367. Valium 5mg  given po as PRN. Aileen Fass, MD informed with new orders made. Will continue to monitor the patient

## 2022-11-30 NOTE — Progress Notes (Signed)
PROGRESS NOTE        PATIENT DETAILS Name: Jeffrey [name] Age: 62 y.o. Sex: male Date of Birth: 1961/06/28 Admit Date: 11/23/2022 Admitting Physician Marcelyn Bruins, MD MBW:GYKZL, Raynelle Dick, MD  Brief Summary: Patient is a 62 y.o.  male with history of ESRD (fired from his outpatient HD center on 12/15), left AKA-who presented with shortness of breath-patient was found to have acute hypoxic respiratory failure/volume overload in the setting of missed HD.  Significant events: 12/27>> admitted [facility]-hypoxia/volume overload-in the setting of missed HD.  Significant studies: 12/27>> CXR: Hazy opacity in the mid left lung. 12/29>> pulmonary vascular congestion, patchy bilateral pulmonary infiltrates.  Significant microbiology data: 12/28>> COVID/influenza/RSV PCR: Negative 12/28>> blood culture: No growth 12/29>> blood culture: No growth  Procedures: 12/29>> nontunneled central venous catheter by PCCM  Consults: Renal Vascular surgery  Subjective: Lying comfortably in bed-denies any chest pain or shortness of breath.  Objective: Vitals: Blood pressure (!) 148/67, pulse 86, temperature 98.7 F (37.1 C), temperature source Axillary, resp. rate 17, weight 128 kg, SpO2 97 %.   Exam: Gen Exam:Alert awake-not in any distress HEENT:atraumatic, normocephalic Chest: B/L clear to auscultation anteriorly CVS:S1S2 regular Abdomen:soft non tender, non distended Extremities:no edema.  S/p left AKA Neurology: Non focal Skin: no rash  Pertinent Labs/Radiology:    Latest Ref Rng & Units 11/29/2022   10:29 AM 11/27/2022    5:37 AM 11/26/2022    5:48 PM  CBC  WBC 4.0 - 10.5 K/uL 5.7  5.7  7.0   Hemoglobin 13.0 - 17.0 g/dL 9.2  9.9  9.4   Hematocrit 39.0 - 52.0 % 30.4  31.1  29.5   Platelets 150 - 400 K/uL 127  105  127     Lab Results  Component Value Date   NA 132 (L) 11/29/2022   K 3.6 11/29/2022   CL 90 (L) 11/29/2022   CO2 29 11/29/2022       Assessment/Plan: Acute on chronic hypoxic respiratory failure Pulmonary edema/volume overload due to missed HD Acute on chronic HFpEF Improved-with multiple HD sessions-back on usual 3 L of oxygen.  Note-not felt to have PNA/sepsis-not present on admission-ruled out.  Missed hemodialysis Discharged/fired from outpatient HD unit on 12/15 due to threatening behavior Social worker following-trying to find other alternate outpatient HD centers. Patient aware that if all outpatient HD arrangements are exhausted-he may just need to return to the ED for hemodialysis.  Normocytic anemia Related to ESRD Trend CBC  HTN BP reasonable Continue amlodipine/Coreg/torsemide/metolazone  HLD Continue statin  DM-2 (A1c 10.5) with uncontrolled hyperglycemia CBGs remain uncontrolled Agree with diabetic coordinator-restart Humulin R U-500-at half of home dose-70 units twice daily.  Stop Semglee.  Recent Labs    11/30/22 0011 11/30/22 0904 11/30/22 1203  GLUCAP 367* 427* 434*    PTSD Depression Mood disorder Stable this morning Continue Zoloft/nortriptyline/diazepam/trazodone  Chronic pain Degenerative disc disease Chronic arthritis/left shoulder pain Continue supportive care-remains on nortriptyline/Lyrica Unfortunately as not followed up with orthopedics as previously planned.  COPD Not in exacerbation Continue bronchodilators  OSA CPAP nightly  Morbid Obesity: Estimated body mass index is 55.11 kg/m as calculated from the following:   Height as of 10/31/22: 5' (1.524 m).   Weight as of this encounter: 128 kg.   Code status:   Code Status: Full Code   DVT Prophylaxis: heparin injection 5,000 Units Start:  11/24/22 1630   Family Communication: None at bedside   Disposition Plan: Status is: Inpatient Remains inpatient appropriate because: Severity of illness   Planned Discharge Destination:Home   Diet: Diet Order             Diet renal with fluid  restriction Fluid restriction: 1500 mL Fluid; Room service appropriate? Yes; Fluid consistency: Thin  Diet effective now                     Antimicrobial agents: Anti-infectives (From admission, onward)    Start     Dose/Rate Route Frequency Ordered Stop   11/24/22 1430  cefTRIAXone (ROCEPHIN) 1 g in sodium chloride 0.9 % 100 mL IVPB        1 g 200 mL/hr over 30 Minutes Intravenous  Once 11/24/22 1428 11/24/22 1516   11/24/22 1430  azithromycin (ZITHROMAX) 500 mg in sodium chloride 0.9 % 250 mL IVPB        500 mg 250 mL/hr over 60 Minutes Intravenous  Once 11/24/22 1428 11/24/22 1612        MEDICATIONS: Scheduled Meds:  amLODipine  2.5 mg Oral BID   aspirin EC  81 mg Oral q AM   atorvastatin  40 mg Oral q AM   calcium acetate  1,334 mg Oral BID WC   carvedilol  6.25 mg Oral BID WC   Chlorhexidine Gluconate Cloth  6 each Topical Q0600   clopidogrel  75 mg Oral Daily   darbepoetin (ARANESP) injection - DIALYSIS  60 mcg Subcutaneous Q Tue-1800   heparin  5,000 Units Subcutaneous Q8H   insulin aspart  0-20 Units Subcutaneous TID WC   insulin aspart  0-5 Units Subcutaneous QHS   insulin glargine-yfgn  60 Units Subcutaneous BID   [START ON 12/01/2022] metolazone  10 mg Oral Daily   nortriptyline  20 mg Oral QHS   pantoprazole  40 mg Oral QAC breakfast   pregabalin  75 mg Oral q AM   sertraline  100 mg Oral q AM   sodium chloride flush  3 mL Intravenous Q12H   tamsulosin  0.4 mg Oral Daily   torsemide  100 mg Oral BID   Continuous Infusions: PRN Meds:.acetaminophen **OR** acetaminophen, albuterol, diazepam, midodrine, polyethylene glycol, traZODone   I have personally reviewed following labs and imaging studies  LABORATORY DATA: CBC: Recent Labs  Lab 11/24/22 1414 11/24/22 1422 11/25/22 0218 11/26/22 1748 11/27/22 0537 11/29/22 1029  WBC 7.3  --  8.0 7.0 5.7 5.7  NEUTROABS 6.3  --   --   --   --   --   HGB 9.6* 10.2* 9.1* 9.4* 9.9* 9.2*  HCT 30.5* 30.0*  28.6* 29.5* 31.1* 30.4*  MCV 91.0  --  89.4 91.0 89.4 90.5  PLT 120*  --  126* 127* 105* 127*    Basic Metabolic Panel: Recent Labs  Lab 11/25/22 0218 11/25/22 1611 11/26/22 1748 11/27/22 0537 11/29/22 1029 11/30/22 0941  NA 133* 140 135 138 132*  --   K 4.5 4.0 3.7 3.5 3.6  --   CL 91* 96* 96* 98 90*  --   CO2 28 31 29 28 29   --   GLUCOSE 504* 121* 181* 185* 243* 420*  BUN 120* 123* 72* 73* 90*  --   CREATININE 5.52* 5.80* 4.20* 4.02* 4.33*  --   CALCIUM 7.2* 7.3* 7.6* 7.7* 8.2*  --   PHOS 5.5* 5.6* 4.3  --  5.7*  --  GFR: Estimated Creatinine Clearance: 20.3 mL/min (A) (by C-G formula based on SCr of 4.33 mg/dL (H)).  Liver Function Tests: Recent Labs  Lab 11/24/22 1414 11/25/22 0218 11/25/22 1611 11/26/22 1748  AST 11*  --   --   --   ALT 15  --   --   --   ALKPHOS 68  --   --   --   BILITOT 0.5  --   --   --   PROT 7.0  --   --   --   ALBUMIN 3.1* 3.0* 3.0* 3.0*   No results for input(s): "LIPASE", "AMYLASE" in the last 168 hours. No results for input(s): "AMMONIA" in the last 168 hours.  Coagulation Profile: No results for input(s): "INR", "PROTIME" in the last 168 hours.  Cardiac Enzymes: No results for input(s): "CKTOTAL", "CKMB", "CKMBINDEX", "TROPONINI" in the last 168 hours.  BNP (last 3 results) No results for input(s): "PROBNP" in the last 8760 hours.  Lipid Profile: No results for input(s): "CHOL", "HDL", "LDLCALC", "TRIG", "CHOLHDL", "LDLDIRECT" in the last 72 hours.  Thyroid Function Tests: No results for input(s): "TSH", "T4TOTAL", "FREET4", "T3FREE", "THYROIDAB" in the last 72 hours.  Anemia Panel: No results for input(s): "VITAMINB12", "FOLATE", "FERRITIN", "TIBC", "IRON", "RETICCTPCT" in the last 72 hours.  Urine analysis:    Component Value Date/Time   COLORURINE STRAW (A) 11/07/2021 0033   APPEARANCEUR CLEAR 11/07/2021 0033   LABSPEC 1.006 11/07/2021 0033   PHURINE 5.0 11/07/2021 0033   GLUCOSEU NEGATIVE 11/07/2021 0033    HGBUR NEGATIVE 11/07/2021 0033   BILIRUBINUR NEGATIVE 11/07/2021 0033   KETONESUR NEGATIVE 11/07/2021 0033   PROTEINUR NEGATIVE 11/07/2021 0033   NITRITE NEGATIVE 11/07/2021 0033   LEUKOCYTESUR NEGATIVE 11/07/2021 0033    Sepsis Labs: Lactic Acid, Venous    Component Value Date/Time   LATICACIDVEN 1.1 11/24/2022 1414    MICROBIOLOGY: Recent Results (from the past 240 hour(s))  Blood culture (routine x 2)     Status: None   Collection Time: 11/24/22 12:46 PM   Specimen: BLOOD  Result Value Ref Range Status   Specimen Description BLOOD RIGHT ANTECUBITAL  Final   Special Requests   Final    BOTTLES DRAWN AEROBIC AND ANAEROBIC Blood Culture results may not be optimal due to an inadequate volume of blood received in culture bottles   Culture   Final    NO GROWTH 5 DAYS Performed at [facility], [place] [address], [place], [place] [address]    Report Status 11/29/2022 FINAL  Final  Resp panel by RT-PCR (RSV, Flu A&B, Covid) Anterior Nasal Swab     Status: None   Collection Time: 11/24/22 12:47 PM   Specimen: Anterior Nasal Swab  Result Value Ref Range Status   SARS Coronavirus 2 by RT PCR NEGATIVE NEGATIVE Final    Comment: (NOTE) SARS-CoV-2 target nucleic acids are NOT DETECTED.  The SARS-CoV-2 RNA is generally detectable in upper respiratory specimens during the acute phase of infection. The lowest concentration of SARS-CoV-2 viral copies this assay can detect is 138 copies/mL. A negative result does not preclude SARS-Cov-2 infection and should not be used as the sole basis for treatment or other patient management decisions. A negative result may occur with  improper specimen collection/handling, submission of specimen other than nasopharyngeal swab, presence of viral mutation(s) within the areas targeted by this assay, and inadequate number of viral copies(<138 copies/mL). A negative result must be combined with clinical observations, patient history, and  epidemiological information.  The expected result is Negative.  Fact Sheet for Patients:  EntrepreneurPulse.com.au  Fact Sheet for Healthcare Providers:  IncredibleEmployment.be  This test is no t yet approved or cleared by the [place] FDA and  has been authorized for detection and/or diagnosis of SARS-CoV-2 by FDA under an Emergency Use Authorization (EUA). This EUA will remain  in effect (meaning this test can be used) for the duration of the COVID-19 declaration under Section 564(b)(1) of the Act, 21 U.S.C.section 360bbb-3(b)(1), unless the authorization is terminated  or revoked sooner.       Influenza A by PCR NEGATIVE NEGATIVE Final   Influenza B by PCR NEGATIVE NEGATIVE Final    Comment: (NOTE) The Xpert Xpress SARS-CoV-2/FLU/RSV plus assay is intended as an aid in the diagnosis of influenza from Nasopharyngeal swab specimens and should not be used as a sole basis for treatment. Nasal washings and aspirates are unacceptable for Xpert Xpress SARS-CoV-2/FLU/RSV testing.  Fact Sheet for Patients: EntrepreneurPulse.com.au  Fact Sheet for Healthcare Providers: IncredibleEmployment.be  This test is not yet approved or cleared by the [place] FDA and has been authorized for detection and/or diagnosis of SARS-CoV-2 by FDA under an Emergency Use Authorization (EUA). This EUA will remain in effect (meaning this test can be used) for the duration of the COVID-19 declaration under Section 564(b)(1) of the Act, 21 U.S.C. section 360bbb-3(b)(1), unless the authorization is terminated or revoked.     Resp Syncytial Virus by PCR NEGATIVE NEGATIVE Final    Comment: (NOTE) Fact Sheet for Patients: EntrepreneurPulse.com.au  Fact Sheet for Healthcare Providers: IncredibleEmployment.be  This test is not yet approved or cleared by the [place] FDA and has been  authorized for detection and/or diagnosis of SARS-CoV-2 by FDA under an Emergency Use Authorization (EUA). This EUA will remain in effect (meaning this test can be used) for the duration of the COVID-19 declaration under Section 564(b)(1) of the Act, 21 U.S.C. section 360bbb-3(b)(1), unless the authorization is terminated or revoked.  Performed at [facility], [place] [address], [place], [place] [address]   Blood culture (routine x 2)     Status: None   Collection Time: 11/25/22  2:18 AM   Specimen: BLOOD LEFT ARM  Result Value Ref Range Status   Specimen Description BLOOD LEFT ARM  Final   Special Requests   Final    BOTTLES DRAWN AEROBIC AND ANAEROBIC Blood Culture results may not be optimal due to an excessive volume of blood received in culture bottles   Culture   Final    NO GROWTH 5 DAYS Performed at [facility], [place] [address] [place], [place] [address]    Report Status 11/30/2022 FINAL  Final    RADIOLOGY STUDIES/RESULTS: No results found.   LOS: 5 days   Oren Binet, MD  [facility]    To contact the attending provider between 7A-7P or the covering provider during after hours 7P-7A, please log into the web site [email or web address] and access using universal [place] password for that web site. If you do not have the password, please call the hospital operator.  11/30/2022, 1:42 PM

## 2022-11-30 NOTE — Progress Notes (Signed)
   11/30/22 0900  Assess: MEWS Score  Temp 98.6 F (37 C)  BP (!) 173/62  MAP (mmHg) 89  Pulse Rate 91  ECG Heart Rate 91  Resp (!) 28  SpO2 97 %  Assess: MEWS Score  MEWS Temp 0  MEWS Systolic 0  MEWS Pulse 0  MEWS RR 2  MEWS LOC 0  MEWS Score 2  MEWS Score Color Yellow  Notify: Charge Nurse/RN  Date Charge Nurse/RN Notified 11/30/22  Time Charge Nurse/RN Notified [id number]  Provider Notification  Provider Name/Title Shanker  Date Provider Notified 11/30/22  Time Provider Notified [phone]  Method of Notification Face-to-face  Notification Reason Change in status;Critical Result (bs 427,)  Provider response At bedside  Date of Provider Response 11/30/22  Time of Provider Response [phone]  Assess: SIRS CRITERIA  SIRS Temperature  0  SIRS Pulse 1  SIRS Respirations  1  SIRS WBC 0  SIRS Score Sum  2   Blood pressure meds given per order

## 2022-11-30 NOTE — Progress Notes (Signed)
  Progress Note    11/30/2022 7:39 AM 5 Days Post-Op  Subjective:  resting comfortably, no complaints    Vitals:   11/30/22 0015 11/30/22 0300  BP: (!) 166/66 (!) 123/59  Pulse: 92 85  Resp: 15 13  Temp:  98.4 F (36.9 C)  SpO2: 96% 96%    Physical Exam: General: NAD Extremities:  Palpable right radial pulse. Good thrill in right UE AVG  CBC    Component Value Date/Time   WBC 5.7 11/29/2022 1029   RBC 3.36 (L) 11/29/2022 1029   HGB 9.2 (L) 11/29/2022 1029   HCT 30.4 (L) 11/29/2022 1029   PLT 127 (L) 11/29/2022 1029   MCV 90.5 11/29/2022 1029   MCH 27.4 11/29/2022 1029   MCHC 30.3 11/29/2022 1029   RDW 14.0 11/29/2022 1029   LYMPHSABS 0.3 (L) 11/24/2022 1414   MONOABS 0.5 11/24/2022 1414   EOSABS 0.1 11/24/2022 1414   BASOSABS 0.0 11/24/2022 1414    BMET    Component Value Date/Time   NA 132 (L) 11/29/2022 1029   K 3.6 11/29/2022 1029   CL 90 (L) 11/29/2022 1029   CO2 29 11/29/2022 1029   GLUCOSE 243 (H) 11/29/2022 1029   BUN 90 (H) 11/29/2022 1029   CREATININE 4.33 (H) 11/29/2022 1029   CALCIUM 8.2 (L) 11/29/2022 1029   GFRNONAA 15 (L) 11/29/2022 1029    INR    Component Value Date/Time   INR 1.1 10/19/2022 2253     Intake/Output Summary (Last 24 hours) at 11/30/2022 0739 Last data filed at 11/29/2022 2210 Gross per 24 hour  Intake 360 ml  Output 2700 ml  Net -2340 ml      Assessment/Plan:  61 y.o. male with volume overload   -Patient had issues with right UE AVG cannulation on admission, however he received one full session of dialysis yesterday through the AVG without issue -Decisions on further dialysis access creation depended on function of AVG -At this time the patient wants to and will continue to use the AVG for dialysis as long as it functions properly. We do not need to create new access or intervene at this time. If he should develop issues again, we can reconsider creating new dialysis access   Vicente Serene, PA-C [facility] [phone] 11/30/2022 7:39 AM

## 2022-11-30 NOTE — Progress Notes (Signed)
[facility] Progress Note   Subjective:    Last HD on 1/2 with 2.7 kg UF.  Spoke with outpatient HD MD - the patient was dismissed from their clinic after making a threat to use one of his two guns at home to come in and shoot a tech.   He had had multiple issues previously and had been on a behavior contract but the threat with a plan resulted in the dismissal.  His wife is at bedside.    He states that he can barely get through 3 hours of dialysis (states sometimes signs off earlier than that) and that it takes him a day and a half to get over dialysis - "by the time I feel better it's time to do it again".  I let him know that if he is having these feelings it is important to consider his goals of care to thoughtfully decide if dialysis is something that he wants to continue.   Review of systems:  He reports shortness of breath  Nausea before admission - this is better now Has required oxygen here   Objective Vitals:   11/30/22 0300 11/30/22 0500 11/30/22 0900 11/30/22 1200  BP: (!) 123/59  (!) 173/62 (!) 148/67  Pulse: 85  91 86  Resp: 13  (!) 28 17  Temp: 98.4 F (36.9 C)   98.7 F (37.1 C)  TempSrc: Axillary   Axillary  SpO2: 96%  97% 97%  Weight:  128 kg     Physical Exam  General: Awake, alert, obese habitus, NAD HEENT - NCAT Heart: S1 and S2;no rub Lungs: Clear anteriorly; diminished laterally ; on 4.5 liters oxygen via nasal cannula  Abdomen: Obese, non-tender Extremities: left AKA; right leg with edema  Dialysis Access: right AV graft with bruit ; his left IJ nontunneled catheter is out  [employer]   11/26/22 1730 11/29/22 1422 11/30/22 0500  Weight: 133.1 kg 130.4 kg 128 kg    Intake/Output Summary (Last 24 hours) at 11/30/2022 1300 Last data filed at 11/30/2022 1205 Gross per 24 hour  Intake 720 ml  Output 3150 ml  Net -2430 ml     Additional Objective Labs: Basic Metabolic Panel: Recent Labs  Lab 11/25/22 1611 11/26/22 1748  11/27/22 0537 11/29/22 1029 11/30/22 0941  NA 140 135 138 132*  --   K 4.0 3.7 3.5 3.6  --   CL 96* 96* 98 90*  --   CO2 31 29 28 29   --   GLUCOSE 121* 181* 185* 243* 420*  BUN 123* 72* 73* 90*  --   CREATININE 5.80* 4.20* 4.02* 4.33*  --   CALCIUM 7.3* 7.6* 7.7* 8.2*  --   PHOS 5.6* 4.3  --  5.7*  --    Liver Function Tests: Recent Labs  Lab 11/24/22 1414 11/25/22 0218 11/25/22 1611 11/26/22 1748  AST 11*  --   --   --   ALT 15  --   --   --   ALKPHOS 68  --   --   --   BILITOT 0.5  --   --   --   PROT 7.0  --   --   --   ALBUMIN 3.1* 3.0* 3.0* 3.0*   No results for input(s): "LIPASE", "AMYLASE" in the last 168 hours. CBC: Recent Labs  Lab 11/24/22 1414 11/24/22 1422 11/25/22 0218 11/26/22 1748 11/27/22 0537 11/29/22 1029  WBC 7.3  --  8.0 7.0 5.7 5.7  NEUTROABS 6.3  --   --   --   --   --  HGB 9.6*   < > 9.1* 9.4* 9.9* 9.2*  HCT 30.5*   < > 28.6* 29.5* 31.1* 30.4*  MCV 91.0  --  89.4 91.0 89.4 90.5  PLT 120*  --  126* 127* 105* 127*   < > = values in this interval not displayed.   Blood Culture    Component Value Date/Time   SDES BLOOD LEFT ARM 11/25/2022 0218   SPECREQUEST  11/25/2022 7564    BOTTLES DRAWN AEROBIC AND ANAEROBIC Blood Culture results may not be optimal due to an excessive volume of blood received in culture bottles   CULT  11/25/2022 0218    NO GROWTH 5 DAYS Performed at [facility], [place] [address], [place], [place] [address]    REPTSTATUS 11/30/2022 FINAL 11/25/2022 1884    Cardiac Enzymes: No results for input(s): "CKTOTAL", "CKMB", "CKMBINDEX", "TROPONINI" in the last 168 hours. CBG: Recent Labs  Lab 11/29/22 1606 11/29/22 2102 11/30/22 0011 11/30/22 0904 11/30/22 1203  GLUCAP 183* 295* 367* 427* 434*   Iron Studies: No results for input(s): "IRON", "TIBC", "TRANSFERRIN", "FERRITIN" in the last 72 hours. Lab Results  Component Value Date   INR 1.1 10/19/2022   INR 1.2 03/28/2021   Studies/Results: No results  found.  Medications:   amLODipine  2.5 mg Oral BID   aspirin EC  81 mg Oral q AM   atorvastatin  40 mg Oral q AM   calcium acetate  1,334 mg Oral BID WC   carvedilol  6.25 mg Oral BID WC   Chlorhexidine Gluconate Cloth  6 each Topical Q0600   clopidogrel  75 mg Oral Daily   darbepoetin (ARANESP) injection - DIALYSIS  60 mcg Subcutaneous Q Tue-1800   heparin  5,000 Units Subcutaneous Q8H   insulin aspart  0-20 Units Subcutaneous TID WC   insulin aspart  0-5 Units Subcutaneous QHS   insulin glargine-yfgn  60 Units Subcutaneous BID   metolazone  20 mg Oral Daily   nortriptyline  20 mg Oral QHS   pantoprazole  40 mg Oral QAC breakfast   pregabalin  75 mg Oral q AM   sertraline  100 mg Oral q AM   sodium chloride flush  3 mL Intravenous Q12H   tamsulosin  0.4 mg Oral Daily   torsemide  100 mg Oral BID    Dialysis Orders: Most recent orders from [facility] ([facility] released from [facility] on 11/11/22) >  3h 61min   129kg  Hep 2000  P4   RUA AVG  Home meds include - amlodipine 10, asa, lipitor, phoslo 2 ac tid, coreg 12.5 bid, plavix, valium, insulin regular, zaroxolyn 20 qd, pamelor, protonix, lyrica, sertraline, flomax, demadex, trazodone, prns/ vits/ supps   Assessment/Plan: Vol overload - pt was released/ discharged from his OP HD unit 12/15 due to threatening behavior.  Last HD 2 wks ago prior to this current admission. Was on bipap briefly, is off now.  Most of extra volume now is in the legs, not sure but may be a chronic R HF situation.   HD access - AVG was not functional 12/28 so CCM placed temp HD cath for [place] on 12/29.  This nontunneled catheter was discontinued  ESRD - pt was discharged from his OP HD unit recently as above. HD here per TTS schedule using graft  SW is looking for another outpatient clinic for the patient  After discharge, pt will have to return to hospital here for future dialysis (assuming he doesn't find another  HD unit that will accept him). Per Renal Navigator's note from  12/29, patient has been denied by multiple clinics already.  Discussed with HD SW today.  Will need to f/u on placement progress. HTN - previously held anti-hypertensives due to hypotension earlier this admission; they were resumed cautiously at lower dosing bid with goal of not overtreating his BP which could hamper our ability to get his volume excess under control. Note that he made 1200 of urine over one recent 24 hour period on demedex and metolazone.  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 on aranesp 60 mcg weekly on tuesdays here MBD ckd - CCa and phos in range, cont phoslo 2 ac as binder.  PTSD/ hx psychosis  Disposition - HD SW is looking for other dialysis units at this time.  If not able to locate one then he will unfortunately need to dialyze through the ER  Claudia Desanctis, MD [place] 11/30/2022,1:45 PM  LOS: 5 days

## 2022-11-30 NOTE — Progress Notes (Addendum)
Spoke to [facility] HD SW this morning. Pt is not active with the [facility] at this time. Spoke to pt via phone. Pt reports that he is not active with any [facility] providers in [place] and does not desire to be seen at any [place]. Therefore, pt does not have any VA benefits to cover out-pt HD. Spoke to [facility] CSW, Tess, to see if she has any updates on clinic placement. Tess to f/u with [facility] in [place] to see if they will accept pt. Tess reports that she has spoken to [place] medicaid transportation and if a special form is completed then medicaid transport may be able to transport [place] clinic. Clinic CSW to f/u with navigator after speaking to [facility]. Will assist as needed.   Melven Sartorius Renal Navigator [phone]  Addendum at 2:57 pm: [facility] willing to review pt's case per clinic social worker. Recent clinicals faxed to [facility] admissions per their request. Will fax Hep B total core antibody once result available (nephrologist ordered today).

## 2022-11-30 NOTE — Inpatient Diabetes Management (Signed)
Inpatient Diabetes Program Recommendations  AACE/ADA: New Consensus Statement on Inpatient Glycemic Control (2015)  Target Ranges:  Prepandial:   less than 140 mg/dL      Peak postprandial:   less than 180 mg/dL (1-2 hours)      Critically ill patients:  140 - 180 mg/dL   Lab Results  Component Value Date   GLUCAP 427 (H) 11/30/2022   HGBA1C 10.5 (H) 11/25/2022    Review of Glycemic Control  Latest Reference Range & Units 11/29/22 08:19 11/29/22 16:06 11/29/22 21:02 11/30/22 00:11 11/30/22 09:04  Glucose-Capillary 70 - 99 mg/dL 205 (H) 183 (H) 295 (H) 367 (H) 427 (H)   Diabetes history: DM 2 Outpatient Diabetes medications: Humulin R U-500 140 units bid (!/2 dose in the am on HD days TTSat)  Current orders for Inpatient glycemic control:  Semglee 60 units bid Novolog 0-20 tid + hs   Pt on concentrated U-500 at home, consider   -   Reorder Humulin R U-500 insulin at half dose 70 units bid -   D/c Semglee  Thanks,  Tama Headings RN, MSN, BC-ADM Inpatient Diabetes Coordinator Team Pager [phone] (8a-5p)

## 2022-11-30 NOTE — Significant Event (Signed)
Rapid Response Event Note   Reason for Call :  Swollen, painful right arm  Initial Focused Assessment:  Facial grimacing, pt calling out in pain when right forearm in touched. Right arm is slightly contracted, muscles taut, and spasms noted upon palpation.  HD completed yesterday, K+ 3.6 this AM, CBGs 400s today   VS 145/75 (94) HR 93 RR 17 O2 93% 3L [place]  Interventions:  EKG Start Flexeril 5mg  PRN TID  Plan of Care:  Add Flexeril to care regimen Call back for further needs  Event Summary:  MD Notified: Nena Alexander MD Call Time: [place] Time: 6387 End Time: 5643  Newman Nickels, RN

## 2022-12-01 DIAGNOSIS — N186 End stage renal disease: Secondary | ICD-10-CM | POA: Diagnosis not present

## 2022-12-01 DIAGNOSIS — J9601 Acute respiratory failure with hypoxia: Secondary | ICD-10-CM | POA: Diagnosis not present

## 2022-12-01 DIAGNOSIS — J449 Chronic obstructive pulmonary disease, unspecified: Secondary | ICD-10-CM | POA: Diagnosis not present

## 2022-12-01 DIAGNOSIS — R739 Hyperglycemia, unspecified: Secondary | ICD-10-CM | POA: Diagnosis not present

## 2022-12-01 LAB — RENAL FUNCTION PANEL
Albumin: 3.2 g/dL — ABNORMAL LOW (ref 3.5–5.0)
Anion gap: 12 (ref 5–15)
BUN: 77 mg/dL — ABNORMAL HIGH (ref 8–23)
CO2: 30 mmol/L (ref 22–32)
Calcium: 8.6 mg/dL — ABNORMAL LOW (ref 8.9–10.3)
Chloride: 90 mmol/L — ABNORMAL LOW (ref 98–111)
Creatinine, Ser: 4.28 mg/dL — ABNORMAL HIGH (ref 0.61–1.24)
GFR, Estimated: 15 mL/min — ABNORMAL LOW (ref >60)
Glucose, Bld: 140 mg/dL — ABNORMAL HIGH (ref 70–99)
Phosphorus: 5.7 mg/dL — ABNORMAL HIGH (ref 2.5–4.6)
Potassium: 3.5 mmol/L (ref 3.5–5.1)
Sodium: 132 mmol/L — ABNORMAL LOW (ref 135–145)

## 2022-12-01 LAB — CBC
HCT: 31.4 % — ABNORMAL LOW (ref 39.0–52.0)
Hemoglobin: 9.6 g/dL — ABNORMAL LOW (ref 13.0–17.0)
MCH: 28 pg (ref 26.0–34.0)
MCHC: 30.6 g/dL (ref 30.0–36.0)
MCV: 91.5 fL (ref 80.0–100.0)
Platelets: 123 10*3/uL — ABNORMAL LOW (ref 150–400)
RBC: 3.43 MIL/uL — ABNORMAL LOW (ref 4.22–5.81)
RDW: 13.9 % (ref 11.5–15.5)
WBC: 6.4 10*3/uL (ref 4.0–10.5)
nRBC: 0 % (ref 0.0–0.2)

## 2022-12-01 LAB — GLUCOSE, CAPILLARY
Glucose-Capillary: 286 mg/dL — ABNORMAL HIGH (ref 70–99)
Glucose-Capillary: 397 mg/dL — ABNORMAL HIGH (ref 70–99)
Glucose-Capillary: 256 mg/dL — ABNORMAL HIGH (ref 70–99)

## 2022-12-01 MED ORDER — OXYCODONE HCL 5 MG PO TABS
5.0000 mg | ORAL_TABLET | Freq: Four times a day (QID) | ORAL | Status: DC | PRN
  Administered 2022-12-01 – 2022-12-03 (×3): 5 mg via ORAL
  Filled 2022-12-01 (×3): qty 1

## 2022-12-01 NOTE — Progress Notes (Addendum)
Hep B total core antibody lab faxed to [facility] for review. Case discussed with attending this am.   Melven Sartorius Renal Navigator [phone]  Addendum at 4:09 pm: [facility] who confirms that documents and labs that were faxed yesterday and today were received. [facility] to re-open pt's case with new clinicals to see if pt can be accepted at [facility]. Will assist as needed.

## 2022-12-01 NOTE — Progress Notes (Signed)
[facility] Progress Note   Subjective:    Seen and examined on dialysis.  Blood pressure 143/73 and HR 87  Tolerating goal.  He had 450 mL uop over 1/3 charted (this was with his 20 mg dose of metolazone).    Review of systems:   He reports shortness of breath  Nausea before admission - this is better now Has required oxygen here   Objective Vitals:   12/01/22 0300 12/01/22 0500 12/01/22 0824 12/01/22 0829  BP: (!) 149/73  (!) 141/66 (!) 154/73  Pulse: 77  81 81  Resp: 10  11 11   Temp: 97.6 F (36.4 C)  97.8 F (36.6 C)   TempSrc: Axillary     SpO2: 98%  98% 95%  Weight:  131.1 kg 129.7 kg    Physical Exam  General: Awake, alert, obese habitus, NAD HEENT - NCAT Heart: S1 and S2;no rub Lungs: Clear anteriorly; diminished bilaterally ; on 4 liters oxygen via nasal cannula  Abdomen: Obese, non-tender Extremities: left AKA; right leg with edema  Neuro  alert and oriented x 3 provides hx and follows commands Dialysis Access: right AV graft is in use  Filed Weights   11/30/22 0500 12/01/22 0500 12/01/22 0824  Weight: 128 kg 131.1 kg 129.7 kg    Intake/Output Summary (Last 24 hours) at 12/01/2022 0839 Last data filed at 11/30/2022 1205 Gross per 24 hour  Intake 360 ml  Output 450 ml  Net -90 ml     Additional Objective Labs: Basic Metabolic Panel: Recent Labs  Lab 11/25/22 1611 11/26/22 1748 11/27/22 0537 11/29/22 1029 11/30/22 0941  NA 140 135 138 132*  --   K 4.0 3.7 3.5 3.6  --   CL 96* 96* 98 90*  --   CO2 31 29 28 29   --   GLUCOSE 121* 181* 185* 243* 420*  BUN 123* 72* 73* 90*  --   CREATININE 5.80* 4.20* 4.02* 4.33*  --   CALCIUM 7.3* 7.6* 7.7* 8.2*  --   PHOS 5.6* 4.3  --  5.7*  --    Liver Function Tests: Recent Labs  Lab 11/24/22 1414 11/25/22 0218 11/25/22 1611 11/26/22 1748  AST 11*  --   --   --   ALT 15  --   --   --   ALKPHOS 68  --   --   --   BILITOT 0.5  --   --   --   PROT 7.0  --   --   --   ALBUMIN 3.1* 3.0*  3.0* 3.0*   No results for input(s): "LIPASE", "AMYLASE" in the last 168 hours. CBC: Recent Labs  Lab 11/24/22 1414 11/24/22 1422 11/25/22 0218 11/26/22 1748 11/27/22 0537 11/29/22 1029  WBC 7.3  --  8.0 7.0 5.7 5.7  NEUTROABS 6.3  --   --   --   --   --   HGB 9.6*   < > 9.1* 9.4* 9.9* 9.2*  HCT 30.5*   < > 28.6* 29.5* 31.1* 30.4*  MCV 91.0  --  89.4 91.0 89.4 90.5  PLT 120*  --  126* 127* 105* 127*   < > = values in this interval not displayed.   Blood Culture    Component Value Date/Time   SDES BLOOD LEFT ARM 11/25/2022 0218   SPECREQUEST  11/25/2022 0218    BOTTLES DRAWN AEROBIC AND ANAEROBIC Blood Culture results may not be optimal due to an excessive volume of blood received  in culture bottles   CULT  11/25/2022 0218    NO GROWTH 5 DAYS Performed at [facility] [address] [place] [address]    REPTSTATUS 11/30/2022 FINAL 11/25/2022 4967    Cardiac Enzymes: No results for input(s): "CKTOTAL", "CKMB", "CKMBINDEX", "TROPONINI" in the last 168 hours. CBG: Recent Labs  Lab 11/30/22 0011 11/30/22 0904 11/30/22 1203 11/30/22 1607 11/30/22 2017  GLUCAP 367* 427* 434* 262* 198*   Iron Studies: No results for input(s): "IRON", "TIBC", "TRANSFERRIN", "FERRITIN" in the last 72 hours. Lab Results  Component Value Date   INR 1.1 10/19/2022   INR 1.2 03/28/2021   Studies/Results: No results found.  Medications:   amLODipine  2.5 mg Oral BID   aspirin EC  81 mg Oral q AM   atorvastatin  40 mg Oral q AM   calcium acetate  1,334 mg Oral BID WC   carvedilol  6.25 mg Oral BID WC   Chlorhexidine Gluconate Cloth  6 each Topical Q0600   Chlorhexidine Gluconate Cloth  6 each Topical Q0600   clopidogrel  75 mg Oral Daily   darbepoetin (ARANESP) injection - DIALYSIS  60 mcg Subcutaneous Q Tue-1800   heparin  5,000 Units Subcutaneous Q8H   insulin aspart  0-20 Units Subcutaneous TID WC   insulin regular human CONCENTRATED  70 Units Subcutaneous BID WC    metolazone  10 mg Oral Daily   nortriptyline  20 mg Oral QHS   pantoprazole  40 mg Oral QAC breakfast   pregabalin  75 mg Oral q AM   sertraline  100 mg Oral q AM   sodium chloride flush  3 mL Intravenous Q12H   tamsulosin  0.4 mg Oral Daily   torsemide  100 mg Oral BID    Dialysis Orders: Most recent orders from [facility] ([facility] from [facility] on 11/11/22) >  3h 65min   129kg  Hep 2000  P4   RUA AVG  Home meds include - amlodipine 10, asa, lipitor, phoslo 2 ac tid, coreg 12.5 bid, plavix, valium, insulin regular, zaroxolyn 20 qd, pamelor, protonix, lyrica, sertraline, flomax, demadex, trazodone, prns/ vits/ supps   Assessment/Plan: Vol overload - pt was released/ discharged from his OP HD unit 12/15 due to threatening behavior.  Last HD 2 wks ago prior to this current admission. Was on bipap briefly, is off now.  Most of extra volume now is in the legs, not sure but may be a chronic R HF situation.   HD access - AVG was not functional 12/28 so CCM placed temp HD cath for [place] on 12/29.  This nontunneled catheter was discontinued  ESRD - pt was discharged from his OP HD unit recently as above. HD here per TTS schedule using graft Labs from today not available yet - will follow  SW is looking for another outpatient clinic for the patient  After discharge if not accepted by another outpatient HD unit the patient will have to return to hospital for future dialysis.  Per Renal Navigator's note from 12/29, patient has been denied by multiple clinics.  Appreciate HD SW. HTN - previously held anti-hypertensives due to hypotension earlier this admission; they were resumed cautiously at lower dosing bid with goal of not overtreating his BP which could hamper our ability to get his volume excess under control. Note that he made 1200 of urine over one recent 24 hour period on demedex and metolazone.  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 on aranesp 60 mcg  weekly on tuesdays here MBD ckd - CCa and phos in range, cont phoslo 2 ac as binder.  PTSD/ hx psychosis  Disposition - HD SW is looking for other dialysis units at this time.  If not able to locate one then he will unfortunately need to dialyze through the ER  Claudia Desanctis, MD [place] 12/01/2022,8:56 AM  LOS: 6 days

## 2022-12-01 NOTE — Progress Notes (Signed)
Received patient in bed to unit.  Alert and oriented.  Informed consent signed and in chart.   Treatment initiated: 0829 Treatment completed: 115  Patient tolerated well. Pt opted to end tx early post issues with machine and having to restart tx3. Transported back to the room  Alert, without acute distress.  Hand-off given to patient's nurse.   Access used: AVF Access issues: none  Total UF removed: 1.2L Medication(s) given: none Post HD VS: 88,98%13, Post HD weight: 128.kg   [facility]

## 2022-12-01 NOTE — Progress Notes (Signed)
PROGRESS NOTE        PATIENT DETAILS Name: Jeffrey [name] Age: 62 y.o. Sex: male Date of Birth: [birth year] Admit Date: 11/23/2022 Admitting Physician Marcelyn Bruins, MD PRF:[id number], Raynelle Dick, MD  Brief Summary: Patient is a 62 y.o.  male with history of ESRD (fired from his outpatient HD center on 12/15), left AKA-who presented with shortness of breath-patient was found to have acute hypoxic respiratory failure/volume overload in the setting of missed HD.  Significant events: 12/27>> admitted TRH-hypoxia/volume overload-in the setting of missed HD.  Significant studies: 12/27>> CXR: Hazy opacity in the mid left lung. 12/29>> pulmonary vascular congestion, patchy bilateral pulmonary infiltrates.  Significant microbiology data: 12/28>> COVID/influenza/RSV PCR: Negative 12/28>> blood culture: No growth 12/29>> blood culture: No growth  Procedures: 12/29>> nontunneled central venous catheter by PCCM  Consults: Renal Vascular surgery  Subjective: Seen earlier this morning while at hemodialysis.  No major issues overnight.  No chest pain/shortness of breath-appears very comfortable.  Objective: Vitals: Blood pressure (!) 146/66, pulse 85, temperature 98 F (36.7 C), temperature source Axillary, resp. rate 16, weight 129.7 kg, SpO2 96 %.   Exam: Gen Exam:Alert awake-not in any distress HEENT:atraumatic, normocephalic Chest: B/L clear to auscultation anteriorly CVS:S1S2 regular Abdomen:soft non tender, non distended Extremities: Left AKA-trace edema in right leg. Neurology: Non focal Skin: no rash  Pertinent Labs/Radiology:    Latest Ref Rng & Units 12/01/2022    7:30 AM 11/29/2022   10:29 AM 11/27/2022    5:37 AM  CBC  WBC 4.0 - 10.5 K/uL 6.4  5.7  5.7   Hemoglobin 13.0 - 17.0 g/dL 9.6  9.2  9.9   Hematocrit 39.0 - 52.0 % 31.4  30.4  31.1   Platelets 150 - 400 K/uL 123  127  105     Lab Results  Component Value Date   NA 132 (L) 12/01/2022    K 3.5 12/01/2022   CL 90 (L) 12/01/2022   CO2 30 12/01/2022      Assessment/Plan: Acute on chronic hypoxic respiratory failure Pulmonary edema/volume overload due to missed HD Acute on chronic HFpEF Improved-with multiple HD sessions-back on usual 3 L of oxygen.  Note-not felt to have PNA/sepsis-not present on admission-ruled out.  Missed hemodialysis Discharged/fired from outpatient HD unit on 12/15 due to threatening behavior Social worker following-trying to find other alternate outpatient HD centers. Patient aware that if all outpatient HD arrangements are exhausted-he may just need to return to the ED for hemodialysis.  Normocytic anemia Related to ESRD Trend CBC periodically.  HTN BP reasonable Continue amlodipine/Coreg/torsemide/metolazone  HLD Continue statin  DM-2 (A1c 10.5) with uncontrolled hyperglycemia CBGs better controlled Continue  Humulin R U-500-70 units twice daily and SSI-reassess on 1/5.   Recent Labs    11/30/22 1607 11/30/22 2017 12/01/22 1223  GLUCAP 262* 198* 286*     PTSD Depression Mood disorder Stable this morning Continue Zoloft/nortriptyline/diazepam/trazodone  Chronic pain Degenerative disc disease Chronic arthritis/left shoulder pain Continue supportive care-remains on nortriptyline/Lyrica Unfortunately as not followed up with orthopedics as previously planned.  COPD Not in exacerbation Continue bronchodilators  OSA CPAP nightly  Morbid Obesity: Estimated body mass index is 55.84 kg/m as calculated from the following:   Height as of 10/31/22: 5' (1.524 m).   Weight as of this encounter: 129.7 kg.   Code status:   Code Status: Full Code  DVT Prophylaxis: heparin injection 5,000 Units Start: 11/24/22 1630   Family Communication: None at bedside   Disposition Plan: Status is: Inpatient Remains inpatient appropriate because: Severity of illness   Planned Discharge Destination:Home   Diet: Diet Order              Diet renal with fluid restriction Fluid restriction: 1500 mL Fluid; Room service appropriate? Yes; Fluid consistency: Thin  Diet effective now                     Antimicrobial agents: Anti-infectives (From admission, onward)    Start     Dose/Rate Route Frequency Ordered Stop   11/24/22 1430  cefTRIAXone (ROCEPHIN) 1 g in sodium chloride 0.9 % 100 mL IVPB        1 g 200 mL/hr over 30 Minutes Intravenous  Once 11/24/22 1428 11/24/22 1516   11/24/22 1430  azithromycin (ZITHROMAX) 500 mg in sodium chloride 0.9 % 250 mL IVPB        500 mg 250 mL/hr over 60 Minutes Intravenous  Once 11/24/22 1428 11/24/22 1612        MEDICATIONS: Scheduled Meds:  amLODipine  2.5 mg Oral BID   aspirin EC  81 mg Oral q AM   atorvastatin  40 mg Oral q AM   calcium acetate  1,334 mg Oral BID WC   carvedilol  6.25 mg Oral BID WC   Chlorhexidine Gluconate Cloth  6 each Topical Q0600   Chlorhexidine Gluconate Cloth  6 each Topical Q0600   clopidogrel  75 mg Oral Daily   darbepoetin (ARANESP) injection - DIALYSIS  60 mcg Subcutaneous Q Tue-1800   heparin  5,000 Units Subcutaneous Q8H   insulin aspart  0-20 Units Subcutaneous TID WC   insulin regular human CONCENTRATED  70 Units Subcutaneous BID WC   metolazone  10 mg Oral Daily   nortriptyline  20 mg Oral QHS   pantoprazole  40 mg Oral QAC breakfast   pregabalin  75 mg Oral q AM   sertraline  100 mg Oral q AM   sodium chloride flush  3 mL Intravenous Q12H   tamsulosin  0.4 mg Oral Daily   torsemide  100 mg Oral BID   Continuous Infusions: PRN Meds:.acetaminophen **OR** acetaminophen, albuterol, cyclobenzaprine, diazepam, HYDROmorphone (DILAUDID) injection, midodrine, oxyCODONE, polyethylene glycol, traZODone   I have personally reviewed following labs and imaging studies  LABORATORY DATA: CBC: Recent Labs  Lab 11/24/22 1414 11/24/22 1422 11/25/22 0218 11/26/22 1748 11/27/22 0537 11/29/22 1029 12/01/22 0730  WBC 7.3   --  8.0 7.0 5.7 5.7 6.4  NEUTROABS 6.3  --   --   --   --   --   --   HGB 9.6*   < > 9.1* 9.4* 9.9* 9.2* 9.6*  HCT 30.5*   < > 28.6* 29.5* 31.1* 30.4* 31.4*  MCV 91.0  --  89.4 91.0 89.4 90.5 91.5  PLT 120*  --  126* 127* 105* 127* 123*   < > = values in this interval not displayed.     Basic Metabolic Panel: Recent Labs  Lab 11/25/22 0218 11/25/22 1611 11/26/22 1748 11/27/22 0537 11/29/22 1029 11/30/22 0941 12/01/22 0730  NA 133* 140 135 138 132*  --  132*  K 4.5 4.0 3.7 3.5 3.6  --  3.5  CL 91* 96* 96* 98 90*  --  90*  CO2 28 31 29 28 29   --  30  GLUCOSE 504* 121* 181*  185* 243* 420* 140*  BUN 120* 123* 72* 73* 90*  --  77*  CREATININE 5.52* 5.80* 4.20* 4.02* 4.33*  --  4.28*  CALCIUM 7.2* 7.3* 7.6* 7.7* 8.2*  --  8.6*  PHOS 5.5* 5.6* 4.3  --  5.7*  --  5.7*     GFR: Estimated Creatinine Clearance: 20.7 mL/min (A) (by C-G formula based on SCr of 4.28 mg/dL (H)).  Liver Function Tests: Recent Labs  Lab 11/24/22 1414 11/25/22 0218 11/25/22 1611 11/26/22 1748 12/01/22 0730  AST 11*  --   --   --   --   ALT 15  --   --   --   --   ALKPHOS 68  --   --   --   --   BILITOT 0.5  --   --   --   --   PROT 7.0  --   --   --   --   ALBUMIN 3.1* 3.0* 3.0* 3.0* 3.2*    No results for input(s): "LIPASE", "AMYLASE" in the last 168 hours. No results for input(s): "AMMONIA" in the last 168 hours.  Coagulation Profile: No results for input(s): "INR", "PROTIME" in the last 168 hours.  Cardiac Enzymes: No results for input(s): "CKTOTAL", "CKMB", "CKMBINDEX", "TROPONINI" in the last 168 hours.  BNP (last 3 results) No results for input(s): "PROBNP" in the last 8760 hours.  Lipid Profile: No results for input(s): "CHOL", "HDL", "LDLCALC", "TRIG", "CHOLHDL", "LDLDIRECT" in the last 72 hours.  Thyroid Function Tests: No results for input(s): "TSH", "T4TOTAL", "FREET4", "T3FREE", "THYROIDAB" in the last 72 hours.  Anemia Panel: No results for input(s): "VITAMINB12",  "FOLATE", "FERRITIN", "TIBC", "IRON", "RETICCTPCT" in the last 72 hours.  Urine analysis:    Component Value Date/Time   COLORURINE STRAW (A) 11/07/2021 0033   APPEARANCEUR CLEAR 11/07/2021 0033   LABSPEC 1.006 11/07/2021 0033   PHURINE 5.0 11/07/2021 0033   GLUCOSEU NEGATIVE 11/07/2021 0033   HGBUR NEGATIVE 11/07/2021 0033   BILIRUBINUR NEGATIVE 11/07/2021 0033   KETONESUR NEGATIVE 11/07/2021 0033   PROTEINUR NEGATIVE 11/07/2021 0033   NITRITE NEGATIVE 11/07/2021 0033   LEUKOCYTESUR NEGATIVE 11/07/2021 0033    Sepsis Labs: Lactic Acid, Venous    Component Value Date/Time   LATICACIDVEN 1.1 11/24/2022 1414    MICROBIOLOGY: Recent Results (from the past 240 hour(s))  Blood culture (routine x 2)     Status: None   Collection Time: 11/24/22 12:46 PM   Specimen: BLOOD  Result Value Ref Range Status   Specimen Description BLOOD RIGHT ANTECUBITAL  Final   Special Requests   Final    BOTTLES DRAWN AEROBIC AND ANAEROBIC Blood Culture results may not be optimal due to an inadequate volume of blood received in culture bottles   Culture   Final    NO GROWTH 5 DAYS Performed at [facility], [place] [address] [place], [place] [address]    Report Status 11/29/2022 FINAL  Final  Resp panel by RT-PCR (RSV, Flu A&B, Covid) Anterior Nasal Swab     Status: None   Collection Time: 11/24/22 12:47 PM   Specimen: Anterior Nasal Swab  Result Value Ref Range Status   SARS Coronavirus 2 by RT PCR NEGATIVE NEGATIVE Final    Comment: (NOTE) SARS-CoV-2 target nucleic acids are NOT DETECTED.  The SARS-CoV-2 RNA is generally detectable in upper respiratory specimens during the acute phase of infection. The lowest concentration of SARS-CoV-2 viral copies this assay can detect is 138 copies/mL. A  negative result does not preclude SARS-Cov-2 infection and should not be used as the sole basis for treatment or other patient management decisions. A negative result may occur with  improper  specimen collection/handling, submission of specimen other than nasopharyngeal swab, presence of viral mutation(s) within the areas targeted by this assay, and inadequate number of viral copies(<138 copies/mL). A negative result must be combined with clinical observations, patient history, and epidemiological information. The expected result is Negative.  Fact Sheet for Patients:  EntrepreneurPulse.com.au  Fact Sheet for Healthcare Providers:  IncredibleEmployment.be  This test is no t yet approved or cleared by the [place] FDA and  has been authorized for detection and/or diagnosis of SARS-CoV-2 by FDA under an Emergency Use Authorization (EUA). This EUA will remain  in effect (meaning this test can be used) for the duration of the COVID-19 declaration under Section 564(b)(1) of the Act, 21 U.S.C.section 360bbb-3(b)(1), unless the authorization is terminated  or revoked sooner.       Influenza A by PCR NEGATIVE NEGATIVE Final   Influenza B by PCR NEGATIVE NEGATIVE Final    Comment: (NOTE) The Xpert Xpress SARS-CoV-2/FLU/RSV plus assay is intended as an aid in the diagnosis of influenza from Nasopharyngeal swab specimens and should not be used as a sole basis for treatment. Nasal washings and aspirates are unacceptable for Xpert Xpress SARS-CoV-2/FLU/RSV testing.  Fact Sheet for Patients: EntrepreneurPulse.com.au  Fact Sheet for Healthcare Providers: IncredibleEmployment.be  This test is not yet approved or cleared by the [place] FDA and has been authorized for detection and/or diagnosis of SARS-CoV-2 by FDA under an Emergency Use Authorization (EUA). This EUA will remain in effect (meaning this test can be used) for the duration of the COVID-19 declaration under Section 564(b)(1) of the Act, 21 U.S.C. section 360bbb-3(b)(1), unless the authorization is terminated or revoked.     Resp  Syncytial Virus by PCR NEGATIVE NEGATIVE Final    Comment: (NOTE) Fact Sheet for Patients: EntrepreneurPulse.com.au  Fact Sheet for Healthcare Providers: IncredibleEmployment.be  This test is not yet approved or cleared by the [place] FDA and has been authorized for detection and/or diagnosis of SARS-CoV-2 by FDA under an Emergency Use Authorization (EUA). This EUA will remain in effect (meaning this test can be used) for the duration of the COVID-19 declaration under Section 564(b)(1) of the Act, 21 U.S.C. section 360bbb-3(b)(1), unless the authorization is terminated or revoked.  Performed at [facility], [place] [address] [place], [place] [address]   Blood culture (routine x 2)     Status: None   Collection Time: 11/25/22  2:18 AM   Specimen: BLOOD LEFT ARM  Result Value Ref Range Status   Specimen Description BLOOD LEFT ARM  Final   Special Requests   Final    BOTTLES DRAWN AEROBIC AND ANAEROBIC Blood Culture results may not be optimal due to an excessive volume of blood received in culture bottles   Culture   Final    NO GROWTH 5 DAYS Performed at [facility], [place] [address], [place], [place] [address]    Report Status 11/30/2022 FINAL  Final  MRSA Next Gen by PCR, Nasal     Status: Abnormal   Collection Time: 11/30/22  5:05 PM   Specimen: Nasal Mucosa; Nasal Swab  Result Value Ref Range Status   MRSA by PCR Next Gen DETECTED (A) NOT DETECTED Final    Comment: RESULT CALLED TO, READ BACK BY AND VERIFIED WITH: RN NICOLE ATKINS ON  11/30/22 @ 1951 BY DRT (NOTE) The GeneXpert MRSA Assay (FDA approved for NASAL specimens only), is one component of a comprehensive MRSA colonization surveillance program. It is not intended to diagnose MRSA infection nor to guide or monitor treatment for MRSA infections. Test performance is not FDA approved in patients less than 64 years old. Performed at [facility], [place] [address], [place], [place] [address]     RADIOLOGY STUDIES/RESULTS: No results found.   LOS: 6 days   Oren Binet, MD  [facility]    To contact the attending provider between 7A-7P or the covering provider during after hours 7P-7A, please log into the web site [email or web address] and access using universal [place] password for that web site. If you do not have the password, please call the hospital operator.  12/01/2022, 1:32 PM

## 2022-12-01 NOTE — TOC Initial Note (Signed)
Transition of Care [facility]) - Initial/Assessment Note    Patient Details  Name: Jeffrey [name] MRN: [id number] Date of Birth: Jul 26, 1961  Transition of Care [facility]) CM/SW Contact:    Verdell Carmine, RN Phone Number: 12/01/2022, 3:21 PM  Clinical Narrative:                 62 year old patient presented to ED with [facility]. He has home oxygen supplied by [employer]. Has DME at home. He had not had dialysis in over a week, due to dialysis center discharging him due to threatening behavior.  Renal CSW is looking for someone to clip to, this has been a challenge. Continues to meet need for hospitalization.   TOC will follow for needs, recommendations, and transitions of care  Expected Discharge Plan: Home/Self Care Barriers to Discharge: Continued Medical Work up   Patient Goals and CMS Choice            Expected Discharge Plan and Services       Living arrangements for the past 2 months: Apartment                                      Prior Living Arrangements/Services Living arrangements for the past 2 months: Apartment Lives with:: Self Patient language and need for interpreter reviewed:: Yes        Need for Family Participation in Patient Care: Yes (Comment) Care giver support system in place?: Yes (comment)   Criminal Activity/Legal Involvement Pertinent to Current Situation/Hospitalization: No - Comment as needed  Activities of Daily Living Home Assistive Devices/Equipment: Bedside commode/3-in-1, Prosthesis, Wheelchair ADL Screening (condition at time of admission) Patient's cognitive ability adequate to safely complete daily activities?: Yes Is the patient deaf or have difficulty hearing?: No Does the patient have difficulty seeing, even when wearing glasses/contacts?: No Does the patient have difficulty concentrating, remembering, or making decisions?: No Patient able to express need for assistance with ADLs?: Yes Does the patient have difficulty dressing or  bathing?: Yes Independently performs ADLs?: Yes (appropriate for developmental age) Does the patient have difficulty walking or climbing stairs?: Yes Weakness of Legs: Right (left BKA) Weakness of Arms/Hands: None  Permission Sought/Granted                  Emotional Assessment       Orientation: : Oriented to Self, Oriented to Place, Oriented to  Time, Oriented to Situation Alcohol / Substance Use: Not Applicable Psych Involvement: No (comment)  Admission diagnosis:  End stage renal disease ([place]) [N18.6] Hyperglycemia [R73.9] Volume overload [E87.70] Acute respiratory failure with hypoxia ([place]) [J96.01] Patient Active Problem List   Diagnosis Date Noted   Acute on chronic respiratory failure with hypoxia ([place]) 11/24/2022   Uremia 11/24/2022   Encounter for removal of sutures 07/15/2022   Stress reaction of bone 07/12/2022   Benign prostatic hyperplasia with weak urinary stream 12/30/2021   Swelling of right upper extremity 11/07/2021   S/P reverse total shoulder arthroplasty, right 10/27/2021   AV graft malfunction ([place]) 09/01/2021   Dialysis AV fistula malfunction, initial encounter ([place]) 08/30/2021   Mood disorder ([place]) 08/30/2021   Disorder of ligament, right ankle 08/03/2021   Pain in right ankle and joints of right foot 08/03/2021   Local infection due to central venous catheter, sequela 07/21/2021   Acquired absence of left leg below knee ([place]) 06/21/2021   S/P AKA (above knee amputation),  left ([place]) 05/04/2021   Bacteremia 04/20/2021   Other specified joint disorders, left knee 04/20/2021   Iron deficiency anemia, unspecified 04/13/2021   Anemia of chronic disease    Infection of prosthetic left knee joint ([place])    MRSA bacteremia 03/30/2021   Left knee pain    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    Fever 03/29/2021   End stage renal disease ([place]) 03/29/2021   COPD (chronic obstructive pulmonary disease) ([place]) 03/29/2021   Type 2  diabetes mellitus with hyperlipidemia ([place]) 03/29/2021   PTSD (post-traumatic stress disorder) 03/29/2021   Dyspnea, unspecified 10/12/2020   Pain in arm, unspecified 10/12/2020   Mild protein-calorie malnutrition ([place]) 09/06/2020   Status post cervical spinal fusion 08/31/2020   Controlled substance agreement signed 08/30/2020   Pain, unspecified 08/20/2020   Secondary hyperparathyroidism of renal origin ([place]) 08/15/2020   Allergy, unspecified, sequela 07/21/2020   Coagulation defect, unspecified ([place]) 07/21/2020   Encounter for immunization 07/21/2020   Personal history of anaphylaxis 07/21/2020   Cervical myelopathy ([place]) 07/03/2020   Bilateral bunions 05/08/2020   Hammer toes of both feet 05/08/2020   Onychomycosis of toenail 05/08/2020   Muscle cramps 05/07/2020   History of 2019 novel coronavirus disease (COVID-19) 11/06/2019   Cervical radiculopathy 10/28/2019   Volume overload 06/25/2019   Degenerative disc disease, cervical 04/02/2019   History of MRSA infection 10/04/2018   Spinal stenosis of lumbosacral region 08/28/2018   Primary osteoarthritis of both shoulders 02/12/2018   Rotator cuff arthropathy of both shoulders 02/12/2018   Lumbar degenerative disc disease 01/25/2018   Chronic midline low back pain without sciatica 12/15/2017   History of colon polyps 08/15/2016   Mild episode of recurrent major depressive disorder ([place]) 05/30/2016   Vitamin D deficiency 04/18/2016   Chronic insomnia 12/21/2015   Pulmonary hypertension, mild (HCC) 04/29/2014   Chronic gastritis without bleeding 01/13/2014   Diabetic polyneuropathy associated with type 2 diabetes mellitus ([place]) 01/13/2014   Chronic diastolic heart failure ([place]) 12/24/2013   OSA on CPAP 12/24/2013   Primary osteoarthritis of both knees 08/26/2013   Primary osteoarthritis of right knee 08/26/2013   Status post total bilateral knee replacement 05/02/2013   Mixed hyperlipidemia 07/30/2012   Class 3 severe obesity  due to excess calories with serious comorbidity and body mass index (BMI) of 45.0 to 49.9 in adult ([place]) 07/27/2012   Gastroesophageal reflux disease without esophagitis 05/12/2008   Erectile dysfunction due to diseases classified elsewhere 01/19/2005   PCP:  Kristie Cowman, MD Pharmacy:   [facility] [place], [place] [address] AT [facility] [address] [place] [address] Phone: [phone] Fax: [phone]     Social Determinants of Health (SDOH) Social History: SDOH Screenings   Food Insecurity: No Food Insecurity (11/24/2022)  Housing: Low Risk  (11/24/2022)  Transportation Needs: No Transportation Needs (11/24/2022)  Utilities: Not At Risk (11/24/2022)  Tobacco Use: Medium Risk (11/27/2022)   SDOH Interventions: Transportation Interventions: [employer])   Readmission Risk Interventions    11/03/2022   10:19 AM 04/07/2021    3:59 PM  Readmission Risk Prevention Plan  Transportation Screening Complete Complete  Medication Review (RN Care Manager) Complete Referral to Pharmacy  PCP or Specialist appointment within 3-5 days of discharge  Complete  HRI or [place] Complete Complete  SW Recovery Care/Counseling Consult Complete Complete  Palliative Care Screening Not Applicable Not [place] Not  Applicable Complete

## 2022-12-02 DIAGNOSIS — J449 Chronic obstructive pulmonary disease, unspecified: Secondary | ICD-10-CM | POA: Diagnosis not present

## 2022-12-02 DIAGNOSIS — N186 End stage renal disease: Secondary | ICD-10-CM | POA: Diagnosis not present

## 2022-12-02 DIAGNOSIS — J9601 Acute respiratory failure with hypoxia: Secondary | ICD-10-CM | POA: Diagnosis not present

## 2022-12-02 DIAGNOSIS — R739 Hyperglycemia, unspecified: Secondary | ICD-10-CM | POA: Diagnosis not present

## 2022-12-02 LAB — IRON AND TIBC
Iron: 64 ug/dL (ref 45–182)
Saturation Ratios: 31 % (ref 17.9–39.5)
TIBC: 209 ug/dL — ABNORMAL LOW (ref 250–450)
UIBC: 145 ug/dL

## 2022-12-02 LAB — FERRITIN: Ferritin: 1056 ng/mL — ABNORMAL HIGH (ref 24–336)

## 2022-12-02 LAB — GLUCOSE, CAPILLARY
Glucose-Capillary: 184 mg/dL — ABNORMAL HIGH (ref 70–99)
Glucose-Capillary: 271 mg/dL — ABNORMAL HIGH (ref 70–99)
Glucose-Capillary: 281 mg/dL — ABNORMAL HIGH (ref 70–99)
Glucose-Capillary: 272 mg/dL — ABNORMAL HIGH (ref 70–99)

## 2022-12-02 MED ORDER — MUPIROCIN 2 % EX OINT
TOPICAL_OINTMENT | Freq: Two times a day (BID) | CUTANEOUS | Status: DC
  Filled 2022-12-02 (×2): qty 22

## 2022-12-02 MED ORDER — CHLORHEXIDINE GLUCONATE CLOTH 2 % EX PADS
6.0000 | MEDICATED_PAD | Freq: Every day | CUTANEOUS | Status: DC
  Administered 2022-12-03 – 2022-12-04 (×2): 6 via TOPICAL

## 2022-12-02 MED ORDER — INSULIN REGULAR HUMAN (CONC) 500 UNIT/ML ~~LOC~~ SOPN
72.0000 [IU] | PEN_INJECTOR | Freq: Two times a day (BID) | SUBCUTANEOUS | Status: DC
  Administered 2022-12-02 – 2022-12-03 (×3): 72 [IU] via SUBCUTANEOUS
  Filled 2022-12-02: qty 3

## 2022-12-02 MED ORDER — DARBEPOETIN ALFA 100 MCG/0.5ML IJ SOSY: 100.0000 ug | PREFILLED_SYRINGE | INTRAMUSCULAR | Status: DC

## 2022-12-02 NOTE — Progress Notes (Addendum)
Awaiting determination from [facility] as to whether pt can be accepted at a clinic in [place]. Communicated with clinic social worker this morning to inquire if there have been any updates from [facility] since they will call her due to her submitting the original referral. Will provide updates to staff as they become available.   Melven Sartorius Renal Navigator [phone]  Addendum at 1:05 pm: Communicated with out-pt clinic social worker at [facility] (pt's old clinic). Due to social worker making referral to [facility] Admissions prior to pt's hospital admission, [facility] contacted clinic social worker today to say they there is not a local provider that is willing to accept pt at a local [facility] clinic. Pt has been denied by other local [facility] clinics and thus far [facility]. [facility] will be out-of[facility]network with pt's insurance as of Dec 29, 2022 per [facility] staff. Clinic social worker to f/u with pt with the latest information. Navigator provided update to attending, nephrologist, and [facility] staff. Will assist as needed.

## 2022-12-02 NOTE — Progress Notes (Signed)
PROGRESS NOTE        PATIENT DETAILS Name: Jeffrey [name] Age: 62 y.o. Sex: male Date of Birth: [birth year] Admit Date: 11/23/2022 Admitting Physician Marcelyn Bruins, MD PQD:IYMEB, Raynelle Dick, MD  Brief Summary: Patient is a 62 y.o.  male with history of ESRD (fired from his outpatient HD center on 12/15), left AKA-who presented with shortness of breath-patient was found to have acute hypoxic respiratory failure/volume overload in the setting of missed HD.  Significant events: 12/27>> admitted TRH-hypoxia/volume overload-in the setting of missed HD.  Significant studies: 12/27>> CXR: Hazy opacity in the mid left lung. 12/29>> pulmonary vascular congestion, patchy bilateral pulmonary infiltrates.  Significant microbiology data: 12/28>> COVID/influenza/RSV PCR: Negative 12/28>> blood culture: No growth 12/29>> blood culture: No growth  Procedures: 12/29>> nontunneled central venous catheter by PCCM  Consults: Renal Vascular surgery  Subjective: No chest pain/shortness of breath-no major issues overnight.  Objective: Vitals: Blood pressure (!) 150/69, pulse 82, temperature 98.1 F (36.7 C), temperature source Oral, resp. rate 17, weight 129.9 kg, SpO2 97 %.   Exam: Gen Exam:Alert awake-not in any distress HEENT:atraumatic, normocephalic Chest: B/L clear to auscultation anteriorly CVS:S1S2 regular Abdomen:soft non tender, non distended Extremities: Left AKA-trace edema in right leg. Neurology: Non focal Skin: no rash  Pertinent Labs/Radiology:    Latest Ref Rng & Units 12/01/2022    7:30 AM 11/29/2022   10:29 AM 11/27/2022    5:37 AM  CBC  WBC 4.0 - 10.5 K/uL 6.4  5.7  5.7   Hemoglobin 13.0 - 17.0 g/dL 9.6  9.2  9.9   Hematocrit 39.0 - 52.0 % 31.4  30.4  31.1   Platelets 150 - 400 K/uL 123  127  105     Lab Results  Component Value Date   NA 132 (L) 12/01/2022   K 3.5 12/01/2022   CL 90 (L) 12/01/2022   CO2 30 12/01/2022       Assessment/Plan: Acute on chronic hypoxic respiratory failure Pulmonary edema/volume overload due to missed HD Acute on chronic HFpEF Improved-with multiple HD sessions-back on usual 3 L of oxygen.  Note-not felt to have PNA/sepsis-not present on admission-ruled out.  Missed hemodialysis Discharged/fired from outpatient HD unit on 12/15 due to threatening behavior Social worker following-trying to find other alternate outpatient HD centers. Patient aware that if all outpatient HD arrangements are exhausted-he may just need to return to the ED for hemodialysis.  Normocytic anemia Related to ESRD Trend CBC periodically.  HTN BP reasonable Continue amlodipine/Coreg/torsemide/metolazone  HLD Continue statin  DM-2 (A1c 10.5) with uncontrolled hyperglycemia CBGs better controlled but still on the higher side Increase Humulin R U-500- to 72 units twice daily and SSI-reassess on 1/5.   Recent Labs    12/01/22 1551 12/01/22 2137 12/02/22 0757  GLUCAP 397* 256* 184*     PTSD Depression Mood disorder Stable this morning Continue Zoloft/nortriptyline/diazepam/trazodone  Chronic pain Degenerative disc disease Chronic arthritis/left shoulder pain Continue supportive care-remains on nortriptyline/Lyrica Unfortunately has not followed up with orthopedics as previously planned.  COPD Not in exacerbation Continue bronchodilators  OSA CPAP nightly  Morbid Obesity: Estimated body mass index is 55.95 kg/m as calculated from the following:   Height as of 10/31/22: 5' (1.524 m).   Weight as of this encounter: 129.9 kg.   Code status:   Code Status: Full Code   DVT Prophylaxis: heparin injection  5,000 Units Start: 11/24/22 1630   Family Communication: None at bedside   Disposition Plan: Status is: Inpatient Remains inpatient appropriate because: Severity of illness   Planned Discharge Destination:Home once outpatient dialysis arrangements have been  made.   Diet: Diet Order             Diet renal with fluid restriction Fluid restriction: 1500 mL Fluid; Room service appropriate? Yes; Fluid consistency: Thin  Diet effective now                     Antimicrobial agents: Anti-infectives (From admission, onward)    Start     Dose/Rate Route Frequency Ordered Stop   11/24/22 1430  cefTRIAXone (ROCEPHIN) 1 g in sodium chloride 0.9 % 100 mL IVPB        1 g 200 mL/hr over 30 Minutes Intravenous  Once 11/24/22 1428 11/24/22 1516   11/24/22 1430  azithromycin (ZITHROMAX) 500 mg in sodium chloride 0.9 % 250 mL IVPB        500 mg 250 mL/hr over 60 Minutes Intravenous  Once 11/24/22 1428 11/24/22 1612        MEDICATIONS: Scheduled Meds:  amLODipine  2.5 mg Oral BID   aspirin EC  81 mg Oral q AM   atorvastatin  40 mg Oral q AM   calcium acetate  1,334 mg Oral BID WC   carvedilol  6.25 mg Oral BID WC   Chlorhexidine Gluconate Cloth  6 each Topical Q0600   Chlorhexidine Gluconate Cloth  6 each Topical Q0600   clopidogrel  75 mg Oral Daily   darbepoetin (ARANESP) injection - DIALYSIS  60 mcg Subcutaneous Q Tue-1800   heparin  5,000 Units Subcutaneous Q8H   insulin aspart  0-20 Units Subcutaneous TID WC   insulin regular human CONCENTRATED  70 Units Subcutaneous BID WC   metolazone  10 mg Oral Daily   mupirocin ointment   Nasal BID   nortriptyline  20 mg Oral QHS   pantoprazole  40 mg Oral QAC breakfast   pregabalin  75 mg Oral q AM   sertraline  100 mg Oral q AM   sodium chloride flush  3 mL Intravenous Q12H   tamsulosin  0.4 mg Oral Daily   torsemide  100 mg Oral BID   Continuous Infusions: PRN Meds:.acetaminophen **OR** acetaminophen, albuterol, cyclobenzaprine, diazepam, HYDROmorphone (DILAUDID) injection, midodrine, oxyCODONE, polyethylene glycol, traZODone   I have personally reviewed following labs and imaging studies  LABORATORY DATA: CBC: Recent Labs  Lab 11/26/22 1748 11/27/22 0537 11/29/22 1029  12/01/22 0730  WBC 7.0 5.7 5.7 6.4  HGB 9.4* 9.9* 9.2* 9.6*  HCT 29.5* 31.1* 30.4* 31.4*  MCV 91.0 89.4 90.5 91.5  PLT 127* 105* 127* 123*     Basic Metabolic Panel: Recent Labs  Lab 11/25/22 1611 11/26/22 1748 11/27/22 0537 11/29/22 1029 11/30/22 0941 12/01/22 0730  NA 140 135 138 132*  --  132*  K 4.0 3.7 3.5 3.6  --  3.5  CL 96* 96* 98 90*  --  90*  CO2 31 29 28 29   --  30  GLUCOSE 121* 181* 185* 243* 420* 140*  BUN 123* 72* 73* 90*  --  77*  CREATININE 5.80* 4.20* 4.02* 4.33*  --  4.28*  CALCIUM 7.3* 7.6* 7.7* 8.2*  --  8.6*  PHOS 5.6* 4.3  --  5.7*  --  5.7*     GFR: Estimated Creatinine Clearance: 20.8 mL/min (A) (by C-G formula based  on SCr of 4.28 mg/dL (H)).  Liver Function Tests: Recent Labs  Lab 11/25/22 1611 11/26/22 1748 12/01/22 0730  ALBUMIN 3.0* 3.0* 3.2*    No results for input(s): "LIPASE", "AMYLASE" in the last 168 hours. No results for input(s): "AMMONIA" in the last 168 hours.  Coagulation Profile: No results for input(s): "INR", "PROTIME" in the last 168 hours.  Cardiac Enzymes: No results for input(s): "CKTOTAL", "CKMB", "CKMBINDEX", "TROPONINI" in the last 168 hours.  BNP (last 3 results) No results for input(s): "PROBNP" in the last 8760 hours.  Lipid Profile: No results for input(s): "CHOL", "HDL", "LDLCALC", "TRIG", "CHOLHDL", "LDLDIRECT" in the last 72 hours.  Thyroid Function Tests: No results for input(s): "TSH", "T4TOTAL", "FREET4", "T3FREE", "THYROIDAB" in the last 72 hours.  Anemia Panel: No results for input(s): "VITAMINB12", "FOLATE", "FERRITIN", "TIBC", "IRON", "RETICCTPCT" in the last 72 hours.  Urine analysis:    Component Value Date/Time   COLORURINE STRAW (A) 11/07/2021 0033   APPEARANCEUR CLEAR 11/07/2021 0033   LABSPEC 1.006 11/07/2021 0033   PHURINE 5.0 11/07/2021 0033   GLUCOSEU NEGATIVE 11/07/2021 0033   HGBUR NEGATIVE 11/07/2021 0033   BILIRUBINUR NEGATIVE 11/07/2021 0033   KETONESUR NEGATIVE  11/07/2021 0033   PROTEINUR NEGATIVE 11/07/2021 0033   NITRITE NEGATIVE 11/07/2021 0033   LEUKOCYTESUR NEGATIVE 11/07/2021 0033    Sepsis Labs: Lactic Acid, Venous    Component Value Date/Time   LATICACIDVEN 1.1 11/24/2022 1414    MICROBIOLOGY: Recent Results (from the past 240 hour(s))  Blood culture (routine x 2)     Status: None   Collection Time: 11/24/22 12:46 PM   Specimen: BLOOD  Result Value Ref Range Status   Specimen Description BLOOD RIGHT ANTECUBITAL  Final   Special Requests   Final    BOTTLES DRAWN AEROBIC AND ANAEROBIC Blood Culture results may not be optimal due to an inadequate volume of blood received in culture bottles   Culture   Final    NO GROWTH 5 DAYS Performed at [facility], [place] [address], [place], [place] [address]    Report Status 11/29/2022 FINAL  Final  Resp panel by RT-PCR (RSV, Flu A&B, Covid) Anterior Nasal Swab     Status: None   Collection Time: 11/24/22 12:47 PM   Specimen: Anterior Nasal Swab  Result Value Ref Range Status   SARS Coronavirus 2 by RT PCR NEGATIVE NEGATIVE Final    Comment: (NOTE) SARS-CoV-2 target nucleic acids are NOT DETECTED.  The SARS-CoV-2 RNA is generally detectable in upper respiratory specimens during the acute phase of infection. The lowest concentration of SARS-CoV-2 viral copies this assay can detect is 138 copies/mL. A negative result does not preclude SARS-Cov-2 infection and should not be used as the sole basis for treatment or other patient management decisions. A negative result may occur with  improper specimen collection/handling, submission of specimen other than nasopharyngeal swab, presence of viral mutation(s) within the areas targeted by this assay, and inadequate number of viral copies(<138 copies/mL). A negative result must be combined with clinical observations, patient history, and epidemiological information. The expected result is Negative.  Fact Sheet for Patients:   EntrepreneurPulse.com.au  Fact Sheet for Healthcare Providers:  IncredibleEmployment.be  This test is no t yet approved or cleared by the [place] FDA and  has been authorized for detection and/or diagnosis of SARS-CoV-2 by FDA under an Emergency Use Authorization (EUA). This EUA will remain  in effect (meaning this test can be used) for the duration of the COVID-19  declaration under Section 564(b)(1) of the Act, 21 U.S.C.section 360bbb-3(b)(1), unless the authorization is terminated  or revoked sooner.       Influenza A by PCR NEGATIVE NEGATIVE Final   Influenza B by PCR NEGATIVE NEGATIVE Final    Comment: (NOTE) The Xpert Xpress SARS-CoV-2/FLU/RSV plus assay is intended as an aid in the diagnosis of influenza from Nasopharyngeal swab specimens and should not be used as a sole basis for treatment. Nasal washings and aspirates are unacceptable for Xpert Xpress SARS-CoV-2/FLU/RSV testing.  Fact Sheet for Patients: EntrepreneurPulse.com.au  Fact Sheet for Healthcare Providers: IncredibleEmployment.be  This test is not yet approved or cleared by the [place] FDA and has been authorized for detection and/or diagnosis of SARS-CoV-2 by FDA under an Emergency Use Authorization (EUA). This EUA will remain in effect (meaning this test can be used) for the duration of the COVID-19 declaration under Section 564(b)(1) of the Act, 21 U.S.C. section 360bbb-3(b)(1), unless the authorization is terminated or revoked.     Resp Syncytial Virus by PCR NEGATIVE NEGATIVE Final    Comment: (NOTE) Fact Sheet for Patients: EntrepreneurPulse.com.au  Fact Sheet for Healthcare Providers: IncredibleEmployment.be  This test is not yet approved or cleared by the [place] FDA and has been authorized for detection and/or diagnosis of SARS-CoV-2 by FDA under an Emergency Use  Authorization (EUA). This EUA will remain in effect (meaning this test can be used) for the duration of the COVID-19 declaration under Section 564(b)(1) of the Act, 21 U.S.C. section 360bbb-3(b)(1), unless the authorization is terminated or revoked.  Performed at [facility], [place] [address], [place], [place] [address]   Blood culture (routine x 2)     Status: None   Collection Time: 11/25/22  2:18 AM   Specimen: BLOOD LEFT ARM  Result Value Ref Range Status   Specimen Description BLOOD LEFT ARM  Final   Special Requests   Final    BOTTLES DRAWN AEROBIC AND ANAEROBIC Blood Culture results may not be optimal due to an excessive volume of blood received in culture bottles   Culture   Final    NO GROWTH 5 DAYS Performed at [facility], [place] [address] [place], [place] [address]    Report Status 11/30/2022 FINAL  Final  MRSA Next Gen by PCR, Nasal     Status: Abnormal   Collection Time: 11/30/22  5:05 PM   Specimen: Nasal Mucosa; Nasal Swab  Result Value Ref Range Status   MRSA by PCR Next Gen DETECTED (A) NOT DETECTED Final    Comment: RESULT CALLED TO, READ BACK BY AND VERIFIED WITH: RN NICOLE ATKINS ON 11/30/22 @ 1951 BY DRT (NOTE) The GeneXpert MRSA Assay (FDA approved for NASAL specimens only), is one component of a comprehensive MRSA colonization surveillance program. It is not intended to diagnose MRSA infection nor to guide or monitor treatment for MRSA infections. Test performance is not FDA approved in patients less than 35 years old. Performed at [facility], [place] [address], [place], [place] [address]     RADIOLOGY STUDIES/RESULTS: No results found.   LOS: 7 days   Oren Binet, MD  [facility]    To contact the attending provider between 7A-7P or the covering provider during after hours 7P-7A, please log into the web site [email or web address] and access using universal [place] password for that web site. If you do not have the  password, please call the hospital operator.  12/02/2022, 12:09 PM

## 2022-12-02 NOTE — Inpatient Diabetes Management (Addendum)
Inpatient Diabetes Program Recommendations  AACE/ADA: New Consensus Statement on Inpatient Glycemic Control (2015)  Target Ranges:  Prepandial:   less than 140 mg/dL      Peak postprandial:   less than 180 mg/dL (1-2 hours)      Critically ill patients:  140 - 180 mg/dL   Lab Results  Component Value Date   GLUCAP 184 (H) 12/02/2022   HGBA1C 10.5 (H) 11/25/2022    Review of Glycemic Control  Latest Reference Range & Units 12/01/22 12:23 12/01/22 15:51 12/01/22 21:37 12/02/22 07:57  Glucose-Capillary 70 - 99 mg/dL 286 (H) 397 (H) 256 (H) 184 (H)    Diabetes history: DM 2 Outpatient Diabetes medications: Humulin R U-500 140 units bid (!/2 dose in the am on HD days TTSat)  Current orders for Inpatient glycemic control:  Humulin R U-500 70 units bid Novolog 0-20 tid + hs   Pt on concentrated U-500 at home, consider  -   Increase Humulin R U-500 insulin to 85 units qam, 75 units qpm  Thanks,  Tama Headings RN, MSN, BC-ADM Inpatient Diabetes Coordinator Team Pager [phone] (8a-5p)

## 2022-12-02 NOTE — Progress Notes (Signed)
[facility] Progress Note   Subjective:    Still awaiting determination from Jeffrey [name].  He had 1.4 liters UOP over 1/4 charted.  Last HD on 1/4 with 1.2 kg UF.  States that they had issues with the machine.     Review of systems:   He reports shortness of breath is better Denies nausea or vomiting  Has required oxygen here  Objective Vitals:   12/02/22 0327 12/02/22 0500 12/02/22 0800 12/02/22 1200  BP: (!) 147/48  (!) 150/69 (!) 146/88  Pulse: 82  82 83  Resp: 18  17 20   Temp: 97.8 F (36.6 C)  98.1 F (36.7 C) 98.2 F (36.8 C)  TempSrc: Oral  Oral Oral  SpO2: 96%  97% 98%  Weight:  129.9 kg     Physical Exam  General: Awake, alert, obese habitus, NAD HEENT - NCAT Heart: S1 and S2;no rub Lungs: Clear anteriorly; diminished bilaterally ; on 3.5 Liters oxygen via nasal cannula  Abdomen: Obese, non-tender Extremities: left AKA; right leg with 1+ edema  Neuro  alert and oriented x 3 provides hx and follows commands Dialysis Access: right AV graft with bruit   Filed Weights   12/01/22 0500 12/01/22 0824 12/02/22 0500  Weight: 131.1 kg 129.7 kg 129.9 kg    Intake/Output Summary (Last 24 hours) at 12/02/2022 1232 Last data filed at 12/02/2022 1000 Gross per 24 hour  Intake 720 ml  Output 200 ml  Net 520 ml     Additional Objective Labs: Basic Metabolic Panel: Recent Labs  Lab 11/26/22 1748 11/27/22 0537 11/29/22 1029 11/30/22 0941 12/01/22 0730  NA 135 138 132*  --  132*  K 3.7 3.5 3.6  --  3.5  CL 96* 98 90*  --  90*  CO2 29 28 29   --  30  GLUCOSE 181* 185* 243* 420* 140*  BUN 72* 73* 90*  --  77*  CREATININE 4.20* 4.02* 4.33*  --  4.28*  CALCIUM 7.6* 7.7* 8.2*  --  8.6*  PHOS 4.3  --  5.7*  --  5.7*   Liver Function Tests: Recent Labs  Lab 11/25/22 1611 11/26/22 1748 12/01/22 0730  ALBUMIN 3.0* 3.0* 3.2*   No results for input(s): "LIPASE", "AMYLASE" in the last 168 hours. CBC: Recent Labs  Lab 11/26/22 1748 11/27/22 0537  11/29/22 1029 12/01/22 0730  WBC 7.0 5.7 5.7 6.4  HGB 9.4* 9.9* 9.2* 9.6*  HCT 29.5* 31.1* 30.4* 31.4*  MCV 91.0 89.4 90.5 91.5  PLT 127* 105* 127* 123*   Blood Culture    Component Value Date/Time   SDES BLOOD LEFT ARM 11/25/2022 0218   SPECREQUEST  11/25/2022 0218    BOTTLES DRAWN AEROBIC AND ANAEROBIC Blood Culture results may not be optimal due to an excessive volume of blood received in culture bottles   CULT  11/25/2022 0218    NO GROWTH 5 DAYS Performed at [facility] [address], [place], [place] [address]    REPTSTATUS 11/30/2022 FINAL 11/25/2022 4196    Cardiac Enzymes: No results for input(s): "CKTOTAL", "CKMB", "CKMBINDEX", "TROPONINI" in the last 168 hours. CBG: Recent Labs  Lab 12/01/22 1223 12/01/22 1551 12/01/22 2137 12/02/22 0757 12/02/22 1216  GLUCAP 286* 397* 256* 184* 271*   Iron Studies: No results for input(s): "IRON", "TIBC", "TRANSFERRIN", "FERRITIN" in the last 72 hours. Lab Results  Component Value Date   INR 1.1 10/19/2022   INR 1.2 03/28/2021   Studies/Results: No results found.  Medications:  amLODipine  2.5 mg Oral BID   aspirin EC  81 mg Oral q AM   atorvastatin  40 mg Oral q AM   calcium acetate  1,334 mg Oral BID WC   carvedilol  6.25 mg Oral BID WC   Chlorhexidine Gluconate Cloth  6 each Topical Q0600   Chlorhexidine Gluconate Cloth  6 each Topical Q0600   clopidogrel  75 mg Oral Daily   darbepoetin (ARANESP) injection - DIALYSIS  60 mcg Subcutaneous Q Tue-1800   heparin  5,000 Units Subcutaneous Q8H   insulin aspart  0-20 Units Subcutaneous TID WC   insulin regular human CONCENTRATED  72 Units Subcutaneous BID WC   metolazone  10 mg Oral Daily   mupirocin ointment   Nasal BID   nortriptyline  20 mg Oral QHS   pantoprazole  40 mg Oral QAC breakfast   pregabalin  75 mg Oral q AM   sertraline  100 mg Oral q AM   sodium chloride flush  3 mL Intravenous Q12H   tamsulosin  0.4 mg Oral Daily   torsemide  100 mg  Oral BID    Dialysis Orders: Most recent orders from [facility] ([facility] on 11/11/22) >  3h 17min   129kg  Hep 2000  P4   RUA AVG  Home meds include - amlodipine 10, asa, lipitor, phoslo 2 ac tid, coreg 12.5 bid, plavix, valium, insulin regular, zaroxolyn 20 qd, pamelor, protonix, lyrica, sertraline, flomax, demadex, trazodone, prns/ vits/ supps   Assessment/Plan: Vol overload - pt was released/ discharged from his OP HD unit 12/15 due to threatening behavior.  Last HD 2 wks ago prior to this current admission. Was on bipap briefly, is off now.  Most of extra volume now is in the legs, not sure but may be a chronic R HF situation.   HD access - AVG was not functional 12/28 so CCM placed temp HD cath for [place] on 12/29.  This nontunneled catheter was discontinued and we are now using his graft again ESRD - pt was discharged from his OP HD unit recently as above. HD here per TTS schedule using graft Labs in AM SW is looking for another outpatient clinic for the patient - awaiting determination from [facility]  Started tight heparin with HD here After discharge if not accepted by another outpatient HD unit the patient will have to return to the hospital for future dialysis.  Per Renal Navigator's note from 12/29, patient has been denied by multiple clinics.  Appreciate HD SW. HTN - previously held anti-hypertensives due to hypotension earlier this admission; they were resumed cautiously at lower dosing bid with goal of not overtreating his BP which could hamper our ability to get his volume excess under control.  Nonoliguric I have reduced metolazone from 20 mg daily to 10 mg daily  He is on torsemide 100 mg BID Anemia esrd - Increase aranesp to 100 mcg weekly on Tuesdays.  Obtain Iron panel  MBD ckd - CCa and phos in range, cont phoslo 2 with meals as binder.  PTSD/ hx psychosis  Disposition - HD SW is looking for other dialysis units at this time.  If not able to locate one then he will  unfortunately need to dialyze through the ER  Claudia Desanctis, MD [facility] 12/02/2022,12:48 PM  LOS: 7 days
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
## 2022-12-03 DIAGNOSIS — N186 End stage renal disease: Secondary | ICD-10-CM | POA: Diagnosis not present

## 2022-12-03 DIAGNOSIS — E1169 Type 2 diabetes mellitus with other specified complication: Secondary | ICD-10-CM | POA: Diagnosis not present

## 2022-12-03 DIAGNOSIS — F330 Major depressive disorder, recurrent, mild: Secondary | ICD-10-CM | POA: Diagnosis not present

## 2022-12-03 DIAGNOSIS — J449 Chronic obstructive pulmonary disease, unspecified: Secondary | ICD-10-CM | POA: Diagnosis not present

## 2022-12-03 LAB — RENAL FUNCTION PANEL
Albumin: 3.6 g/dL (ref 3.5–5.0)
Anion gap: 14 (ref 5–15)
BUN: 43 mg/dL — ABNORMAL HIGH (ref 8–23)
CO2: 29 mmol/L (ref 22–32)
Calcium: 8.8 mg/dL — ABNORMAL LOW (ref 8.9–10.3)
Chloride: 90 mmol/L — ABNORMAL LOW (ref 98–111)
Creatinine, Ser: 3.17 mg/dL — ABNORMAL HIGH (ref 0.61–1.24)
GFR, Estimated: 21 mL/min — ABNORMAL LOW (ref >60)
Glucose, Bld: 151 mg/dL — ABNORMAL HIGH (ref 70–99)
Phosphorus: 3.2 mg/dL (ref 2.5–4.6)
Potassium: 4.1 mmol/L (ref 3.5–5.1)
Sodium: 133 mmol/L — ABNORMAL LOW (ref 135–145)

## 2022-12-03 LAB — CBC
HCT: 34.3 % — ABNORMAL LOW (ref 39.0–52.0)
Hemoglobin: 11 g/dL — ABNORMAL LOW (ref 13.0–17.0)
MCH: 28 pg (ref 26.0–34.0)
MCHC: 32.1 g/dL (ref 30.0–36.0)
MCV: 87.3 fL (ref 80.0–100.0)
Platelets: 149 10*3/uL — ABNORMAL LOW (ref 150–400)
RBC: 3.93 MIL/uL — ABNORMAL LOW (ref 4.22–5.81)
RDW: 13.8 % (ref 11.5–15.5)
WBC: 6.8 10*3/uL (ref 4.0–10.5)
nRBC: 0 % (ref 0.0–0.2)

## 2022-12-03 LAB — GLUCOSE, CAPILLARY
Glucose-Capillary: 356 mg/dL — ABNORMAL HIGH (ref 70–99)
Glucose-Capillary: 168 mg/dL — ABNORMAL HIGH (ref 70–99)
Glucose-Capillary: 316 mg/dL — ABNORMAL HIGH (ref 70–99)

## 2022-12-03 MED ORDER — HEPARIN SODIUM (PORCINE) 1000 UNIT/ML DIALYSIS
20.0000 [IU]/kg | INTRAMUSCULAR | Status: DC | PRN
  Administered 2022-12-03: 2600 [IU] via INTRAVENOUS_CENTRAL
  Filled 2022-12-03: qty 3

## 2022-12-03 NOTE — Progress Notes (Signed)
[facility] Progress Note   Dialysis Orders: Most recent orders from [facility] (pt released from [facility] on 11/11/22) >  3h 43min   129kg  Hep 2000  P4   RUA AVG  Home meds include - amlodipine 10, asa, lipitor, phoslo 2 ac tid, coreg 12.5 bid, plavix, valium, insulin regular, zaroxolyn 20 qd, pamelor, protonix, lyrica, sertraline, flomax, demadex, trazodone, prns/ vits/ supps   Assessment/Plan: Vol overload - pt was released/ discharged from his OP HD unit 12/15 due to threatening behavior.  Last HD 2 wks ago prior to this current admission. Was on bipap briefly, is off now.  Most of extra volume now is in the legs (lt AKA), not sure but may be a chronic R HF situation.   HD access - AVG was not functional 12/28 so CCM placed temp HD cath for [place] on 12/29.  This nontunneled catheter was discontinued and we are now using his graft again ESRD - pt was discharged from his OP HD unit recently as above. HD here per TTS schedule using graft; next HD today SW is looking for another outpatient clinic for the patient - awaiting determination from [facility]  Started tight heparin with HD here After discharge if not accepted by another outpatient HD unit the patient will have to return to the hospital for future dialysis.  Per Renal Navigator's note from 12/29, patient has been denied by multiple clinics.  Appreciate HD SW. HTN - previously held anti-hypertensives due to hypotension earlier this admission; they were resumed cautiously at lower dosing bid with goal of not overtreating his BP which could hamper our ability to get his volume excess under control.  Nonoliguric Reduced metolazone from 20 mg daily to 10 mg daily  He is on torsemide 100 mg BID Anemia esrd - Increased aranesp to 100 mcg weekly on Tuesdays.  Obtain Iron panel  MBD ckd - CCa and phos in range, cont phoslo 2 with meals as binder.  PTSD/ hx psychosis  Disposition - HD SW is looking for other dialysis units at this time.  If not  able to locate one then he will unfortunately need to dialyze through the ER  Subjective:    Still awaiting determination from [facility].  He had 1.4 liters UOP over 1/4 charted.  Last HD on 1/4 with 1.2 kg UF.     Review of systems:   He reports shortness of breath is better Denies nausea or vomiting  Has required oxygen here  Objective Vitals:   12/02/22 1546 12/02/22 2028 12/02/22 2315 12/03/22 0605  BP: (!) 141/92 136/82 (!) 149/100 (!) 145/59  Pulse: 83 91 89 85  Resp: 18 10 15 18   Temp: 98.4 F (36.9 C) 98.5 F (36.9 C) 97.8 F (36.6 C)   TempSrc: Oral Oral Oral   SpO2: 97% 96% 98% 100%  Weight:       Physical Exam  General: Awake, alert, obese habitus, NAD HEENT - NCAT Heart: S1 and S2;no rub Lungs: Clear anteriorly; diminished bilaterally ; on 3.5 Liters oxygen via nasal cannula  Abdomen: Obese, non-tender Extremities: left AKA; right leg with 1+ edema  Neuro  alert and oriented x 3 provides hx and follows commands Dialysis Access: right AV graft with bruit   Filed Weights   12/01/22 0500 12/01/22 0824 12/02/22 0500  Weight: 131.1 kg 129.7 kg 129.9 kg    Intake/Output Summary (Last 24 hours) at 12/03/2022 0752 Last data filed at 12/02/2022 2154 Gross per 24 hour  Intake  1200 ml  Output 700 ml  Net 500 ml     Additional Objective Labs: Basic Metabolic Panel: Recent Labs  Lab 11/26/22 1748 11/27/22 0537 11/29/22 1029 11/30/22 0941 12/01/22 0730  NA 135 138 132*  --  132*  K 3.7 3.5 3.6  --  3.5  CL 96* 98 90*  --  90*  CO2 29 28 29   --  30  GLUCOSE 181* 185* 243* 420* 140*  BUN 72* 73* 90*  --  77*  CREATININE 4.20* 4.02* 4.33*  --  4.28*  CALCIUM 7.6* 7.7* 8.2*  --  8.6*  PHOS 4.3  --  5.7*  --  5.7*   Liver Function Tests: Recent Labs  Lab 11/26/22 1748 12/01/22 0730  ALBUMIN 3.0* 3.2*   No results for input(s): "LIPASE", "AMYLASE" in the last 168 hours. CBC: Recent Labs  Lab 11/26/22 1748 11/27/22 0537 11/29/22 1029 12/01/22 0730   WBC 7.0 5.7 5.7 6.4  HGB 9.4* 9.9* 9.2* 9.6*  HCT 29.5* 31.1* 30.4* 31.4*  MCV 91.0 89.4 90.5 91.5  PLT 127* 105* 127* 123*   Blood Culture    Component Value Date/Time   SDES BLOOD LEFT ARM 11/25/2022 0218   SPECREQUEST  11/25/2022 0218    BOTTLES DRAWN AEROBIC AND ANAEROBIC Blood Culture results may not be optimal due to an excessive volume of blood received in culture bottles   CULT  11/25/2022 0218    NO GROWTH 5 DAYS Performed at [facility] [address], [place], [place] [address]    REPTSTATUS 11/30/2022 FINAL 11/25/2022 6503    Cardiac Enzymes: No results for input(s): "CKTOTAL", "CKMB", "CKMBINDEX", "TROPONINI" in the last 168 hours. CBG: Recent Labs  Lab 12/01/22 2137 12/02/22 0757 12/02/22 1216 12/02/22 1550 12/02/22 2122  GLUCAP 256* 184* 271* 281* 272*   Iron Studies:  Recent Labs    12/02/22 1547  IRON 64  TIBC 209*  FERRITIN 1,056*   Lab Results  Component Value Date   INR 1.1 10/19/2022   INR 1.2 03/28/2021   Studies/Results: No results found.  Medications:   amLODipine  2.5 mg Oral BID   aspirin EC  81 mg Oral q AM   atorvastatin  40 mg Oral q AM   calcium acetate  1,334 mg Oral BID WC   carvedilol  6.25 mg Oral BID WC   Chlorhexidine Gluconate Cloth  6 each Topical Q0600   clopidogrel  75 mg Oral Daily   [START ON 12/06/2022] darbepoetin (ARANESP) injection - DIALYSIS  100 mcg Subcutaneous Q Tue-1800   heparin  5,000 Units Subcutaneous Q8H   insulin aspart  0-20 Units Subcutaneous TID WC   insulin regular human CONCENTRATED  72 Units Subcutaneous BID WC   metolazone  10 mg Oral Daily   mupirocin ointment   Nasal BID   nortriptyline  20 mg Oral QHS   pantoprazole  40 mg Oral QAC breakfast   pregabalin  75 mg Oral q AM   sertraline  100 mg Oral q AM   sodium chloride flush  3 mL Intravenous Q12H   tamsulosin  0.4 mg Oral Daily   torsemide  100 mg Oral BID      Ronnae Kaser, Hunt Oris, MD [facility] 12/03/2022,7:52  AM  LOS: 8 days

## 2022-12-03 NOTE — Discharge Summary (Signed)
PATIENT DETAILS Name: Jeffrey [name] Age: 62 y.o. Sex: male Date of Birth: 1961/04/06 MRN: [id number]. Admitting Physician: Marcelyn Bruins, MD DJS:[id number], Raynelle Dick, MD  Admit Date: 11/23/2022 Discharge date: 12/03/2022  Recommendations for Outpatient Follow-up:  Follow up with PCP in 1-2 weeks Please obtain CMP/CBC in one week Note-fired/discharged from outpatient hemodialysis unit due to threatening behavior-social worker/renal navigator unable to find any other dialysis practices to take patient-patient will be returning to the ED for dialysis needs.  Admitted From:  Home  Disposition: Home   Discharge Condition: fair  CODE STATUS:   Code Status: Full Code   Diet recommendation:  Diet Order             Diet - low sodium heart healthy           Diet Carb Modified           Diet renal with fluid restriction Fluid restriction: 1500 mL Fluid; Room service appropriate? Yes; Fluid consistency: Thin  Diet effective now                    Brief Summary: Patient is a 62 y.o.  male with history of ESRD (fired from his outpatient HD center on 12/15), left [facility]-who presented with shortness of breath-patient was found to have acute hypoxic respiratory failure/volume overload in the setting of missed HD.   Significant events: 12/27>> admitted [facility]-hypoxia/volume overload-in the setting of missed HD. 01/05>> Per renal social worker-all local facilities for HD have been exhausted-patient has not been accepted to any of these dialysis facilities.   Significant studies: 12/27>> CXR: Hazy opacity in the mid left lung. 12/29>> pulmonary vascular congestion, patchy bilateral pulmonary infiltrates.   Significant microbiology data: 12/28>> COVID/influenza/RSV PCR: Negative 12/28>> blood culture: No growth 12/29>> blood culture: No growth   Procedures: 12/29>> nontunneled central venous catheter by PCCM   Consults: Renal Vascular surgery  Brief Hospital Course: Acute on  chronic hypoxic respiratory failure Pulmonary edema/volume overload due to missed HD Acute on chronic HFpEF Improved-with multiple HD sessions-back on usual 3 L of oxygen.   Note-not felt to have PNA/sepsis-not present on admission-ruled out.   Missed hemodialysis Discharged/fired from outpatient HD unit on 12/15 due to threatening behavior Unfortunately-even after an extensive search-locally and all the way to [place] outpatient dialysis center/nephrology willing to accept patient.   Patient aware that since all outpatient HD arrangements are exhausted-he would just need to return to the ED for hemodialysis.  Difficult situation   Normocytic anemia Related to ESRD Trend CBC periodically.   HTN BP reasonable Continue amlodipine/Coreg/torsemide/metolazone   HLD Continue statin   DM-2 (A1c 10.5) with uncontrolled hyperglycemia CBGs better controlled but still on the higher side Will asked patient to resume his usual Humulin R U 500 insulin regimen on discharge.  Obesity: Estimated body mass index is 55.95 kg/m as calculated from the following:   Height as of 10/31/22: 5' (1.524 m).   Weight as of this encounter: 129.9 kg.   Discharge Diagnoses:  Principal Problem:   Volume overload Active Problems:   End stage renal disease (HCC)   COPD (chronic obstructive pulmonary disease) (HCC)   Type 2 diabetes mellitus with hyperlipidemia (HCC)   PTSD (post-traumatic stress disorder)   Mood disorder (HCC)   Chronic diastolic heart failure (HCC)   Chronic midline low back pain without sciatica   Class 3 severe obesity due to excess calories with serious comorbidity and body mass index (BMI) of 45.0 to 49.9 in  adult [facility])   Iron deficiency anemia, unspecified   Mild episode of recurrent major depressive disorder (HCC)   Mixed hyperlipidemia   OSA on CPAP   Acquired absence of left leg below knee (HCC)   Benign prostatic hyperplasia with weak urinary stream   Acute on chronic  respiratory failure with hypoxia ([place])   Uremia   Discharge Instructions:  Activity:  As tolerated with Full fall precautions use walker/cane & assistance as needed  Discharge Instructions     Call MD for:  difficulty breathing, headache or visual disturbances   Complete by: As directed    Diet - low sodium heart healthy   Complete by: As directed    Diet Carb Modified   Complete by: As directed    Discharge instructions   Complete by: As directed    Follow with Primary MD  Kristie Cowman, MD in 1-2 weeks  Please return to the ED on your usual hemodialysis days for dialysis.  Unfortunately-we have not been able to find your outpatient dialysis center.  Please get a complete blood count and chemistry panel checked by your Primary MD at your next visit, and again as instructed by your Primary MD.  Get Medicines reviewed and adjusted: Please take all your medications with you for your next visit with your Primary MD  Laboratory/radiological data: Please request your Primary MD to go over all hospital tests and procedure/radiological results at the follow up, please ask your Primary MD to get all Hospital records sent to his/her office.  In some cases, they will be blood work, cultures and biopsy results pending at the time of your discharge. Please request that your primary care M.D. follows up on these results.  Also Note the following: If you experience worsening of your admission symptoms, develop shortness of breath, life threatening emergency, suicidal or homicidal thoughts you must seek medical attention immediately by calling 911 or calling your MD immediately  if symptoms less severe.  You must read complete instructions/literature along with all the possible adverse reactions/side effects for all the Medicines you take and that have been prescribed to you. Take any new Medicines after you have completely understood and accpet all the possible adverse reactions/side effects.    Do not drive when taking Pain medications or sleeping medications (Benzodaizepines)  Do not take more than prescribed Pain, Sleep and Anxiety Medications. It is not advisable to combine anxiety,sleep and pain medications without talking with your primary care practitioner  Special Instructions: If you have smoked or chewed Tobacco  in the last 2 yrs please stop smoking, stop any regular Alcohol  and or any Recreational drug use.  Wear Seat belts while driving.  Please note: You were cared for by a hospitalist during your hospital stay. Once you are discharged, your primary care physician will handle any further medical issues. Please note that NO REFILLS for any discharge medications will be authorized once you are discharged, as it is imperative that you return to your primary care physician (or establish a relationship with a primary care physician if you do not have one) for your post hospital discharge needs so that they can reassess your need for medications and monitor your lab values.   Increase activity slowly   Complete by: As directed       Allergies as of 12/03/2022       Reactions   Actos [pioglitazone] Anaphylaxis, Rash   Dexmedetomidine Nausea And Vomiting   (Precedex) Dose-limiting bradycardia   Ibuprofen Shortness  Of Breath, Swelling   Pt tolerates aspirin   Tomato Anaphylaxis   Only allergic to RAW tomatoes   Wellbutrin [bupropion] Swelling        Medication List     TAKE these medications    amLODipine 10 MG tablet Commonly known as: NORVASC Take 10 mg by mouth every morning.   Aspirin Low Dose 81 MG tablet Generic drug: aspirin EC Take 81 mg by mouth in the morning.   atorvastatin 40 MG tablet Commonly known as: LIPITOR Take 40 mg by mouth in the morning.   calcium acetate 667 MG capsule Commonly known as: PHOSLO Take 2 capsules (1,334 mg total) by mouth with breakfast, with lunch, and with evening meal.   carvedilol 12.5 MG tablet Commonly  known as: COREG Take 12.5 mg by mouth 2 (two) times daily with a meal.   clopidogrel 75 MG tablet Commonly known as: PLAVIX Take 1 tablet (75 mg total) by mouth daily.   diazepam 5 MG tablet Commonly known as: VALIUM Take 5 mg by mouth 2 (two) times daily as needed for muscle spasms.   diphenhydrAMINE 25 MG tablet Commonly known as: BENADRYL Take 50 mg by mouth every 6 (six) hours as needed for itching.   HumuLIN R U-500 KwikPen 500 UNIT/ML KwikPen Generic drug: insulin regular human CONCENTRATED Inject 45 Units into the skin daily with supper. What changed:  how much to take when to take this   metolazone 10 MG tablet Commonly known as: ZAROXOLYN Take 10 mg by mouth daily.   nortriptyline 10 MG capsule Commonly known as: PAMELOR Take 20 mg by mouth at bedtime.   oxymetazoline 0.05 % nasal spray Commonly known as: AFRIN Place 1 spray into both nostrils daily as needed (nose bleeds).   pantoprazole 40 MG tablet Commonly known as: PROTONIX Take 40 mg by mouth daily before breakfast.   pregabalin 75 MG capsule Commonly known as: LYRICA Take 75 mg by mouth See admin instructions. Daily.  Give after dialysis on dialysis days.   sertraline 100 MG tablet Commonly known as: ZOLOFT Take 100 mg by mouth in the morning.   sodium chloride 0.65 % Soln nasal spray Commonly known as: OCEAN Place 2 sprays into both nostrils in the morning and at bedtime.   tadalafil 20 MG tablet Commonly known as: CIALIS Take 20 mg by mouth daily as needed for erectile dysfunction.   tamsulosin 0.4 MG Caps capsule Commonly known as: FLOMAX Take 1 capsule (0.4 mg total) by mouth daily.   torsemide 20 MG tablet Commonly known as: DEMADEX Take 100 mg by mouth 2 (two) times daily.   traZODone 150 MG tablet Commonly known as: DESYREL Take 150 mg by mouth at bedtime.   Vitamin D 50 MCG (2000 UT) Caps Take 2,000 Units by mouth daily.        Allergies  Allergen Reactions   Actos  [Pioglitazone] Anaphylaxis and Rash   Dexmedetomidine Nausea And Vomiting    (Precedex) Dose-limiting bradycardia     Ibuprofen Shortness Of Breath and Swelling    Pt tolerates aspirin    Tomato Anaphylaxis    Only allergic to RAW tomatoes   Wellbutrin [Bupropion] Swelling     Other Procedures/Studies: DG Chest Port 1 View  Result Date: 11/25/2022 CLINICAL DATA:  Central venous catheter placement EXAM: PORTABLE CHEST 1 VIEW COMPARISON:  11/25/2022 FINDINGS: Left internal jugular hemodialysis catheter tip is seen within the terminal left brachiocephalic vein at its expected juncture with the superior vena cava.  Vascular stent noted within the expected innominate vein Lung volumes are small. Multifocal pulmonary infiltrates within the left mid lung zone and right lung base are again identified and appear stable. No pneumothorax or pleural effusion. Cardiac size is within normal limits when accounting for poor pulmonary insufflation. Right total shoulder arthroplasty has been performed. No acute bone abnormality. IMPRESSION: 1. Left internal jugular hemodialysis catheter tip within the terminal left brachiocephalic vein. No pneumothorax. 2. Pulmonary hypoinflation. 3. Stable multifocal pulmonary infiltrates. Electronically Signed   By: Fidela Salisbury M.D.   On: 11/25/2022 15:05   DG Chest Port 1 View  Result Date: 11/25/2022 CLINICAL DATA:  Respiratory complication; history CHF, COPD, diabetes mellitus, hypertension, end-stage renal disease EXAM: PORTABLE CHEST 1 VIEW COMPARISON:  Portable exam 0730 hours compared to 11/22/2022 FINDINGS: Enlargement of cardiac silhouette with pulmonary vascular congestion. Patchy BILATERAL pulmonary infiltrates which could represent pulmonary edema or multifocal infection. Slightly prominent mediastinal contours with stent identified at RIGHT superior mediastinum. No pleural effusion or pneumothorax. Prior cervical spine fusion and RIGHT shoulder arthroplasty.  IMPRESSION: Enlargement of cardiac silhouette with pulmonary vascular congestion. Patchy BILATERAL pulmonary infiltrates question edema versus multifocal pneumonia. Electronically Signed   By: Lavonia Dana M.D.   On: 11/25/2022 07:46   DG Chest 1 View  Result Date: 11/23/2022 CLINICAL DATA:  Chest pain and shortness of breath EXAM: CHEST  1 VIEW COMPARISON:  Multiple exams, including 10/19/2022 and CT scan 10/20/2022 FINDINGS: Continued fullness of the right hilum, although this was shown to be vascular on the CT scan from 10/20/2022. Hazy opacity in the left mid lung is indistinct and does not silhouette the cardiac border, accordingly more likely in the left lower lobe. This could reflect atelectasis or early pneumonia. Right subclavian stent. Thoracic spondylosis. Reverse right shoulder arthroplasty. Cervical fixators noted. Indistinct basilar opacities in the retrocardiac position and medial right lung base favoring subsegmental atelectasis. IMPRESSION: 1. Hazy opacity in the left mid lung, most likely in the left lower lobe, potentially from atelectasis or early pneumonia. 2. Mild bibasilar atelectasis. 3. Right subclavian stent. 4. Thoracic spondylosis. 5. Reverse right shoulder arthroplasty. Electronically Signed   By: Van Clines M.D.   On: 11/23/2022 20:32     TODAY-DAY OF DISCHARGE:  Subjective:   Jeffrey [name] today has no headache,no chest abdominal pain,no new weakness tingling or numbness, feels much better wants to go home today.   Objective:   Blood pressure (!) 143/76, pulse 85, temperature (!) 97.5 F (36.4 C), temperature source Oral, resp. rate 18, weight 129.9 kg, SpO2 100 %.  Intake/Output Summary (Last 24 hours) at 12/03/2022 1143 Last data filed at 12/03/2022 0800 Gross per 24 hour  Intake 840 ml  Output 1350 ml  Net -510 ml   Filed Weights   12/01/22 0500 12/01/22 0824 12/02/22 0500  Weight: 131.1 kg 129.7 kg 129.9 kg    Exam: Awake Alert, Oriented *3, No new  F.N deficits, Normal affect [place].AT,PERRAL Supple Neck,No JVD, No cervical lymphadenopathy appriciated.  Symmetrical Chest wall movement, Good air movement bilaterally, CTAB RRR,No Gallops,Rubs or new Murmurs, No Parasternal Heave +ve B.Sounds, Abd Soft, Non tender, No organomegaly appriciated, No rebound -guarding or rigidity. No Cyanosis, Clubbing or edema, No new Rash or bruise   PERTINENT RADIOLOGIC STUDIES: No results found.   PERTINENT LAB RESULTS: CBC: Recent Labs    12/01/22 0730  WBC 6.4  HGB 9.6*  HCT 31.4*  PLT 123*   CMET CMP     Component Value Date/Time  NA 132 (L) 12/01/2022 0730   K 3.5 12/01/2022 0730   CL 90 (L) 12/01/2022 0730   CO2 30 12/01/2022 0730   GLUCOSE 140 (H) 12/01/2022 0730   BUN 77 (H) 12/01/2022 0730   CREATININE 4.28 (H) 12/01/2022 0730   CALCIUM 8.6 (L) 12/01/2022 0730   PROT 7.0 11/24/2022 1414   ALBUMIN 3.2 (L) 12/01/2022 0730   AST 11 (L) 11/24/2022 1414   ALT 15 11/24/2022 1414   ALKPHOS 68 11/24/2022 1414   BILITOT 0.5 11/24/2022 1414   GFRNONAA 15 (L) 12/01/2022 0730    GFR Estimated Creatinine Clearance: 20.8 mL/min (A) (by C-G formula based on SCr of 4.28 mg/dL (H)). No results for input(s): "LIPASE", "AMYLASE" in the last 72 hours. No results for input(s): "CKTOTAL", "CKMB", "CKMBINDEX", "TROPONINI" in the last 72 hours. Invalid input(s): "POCBNP" No results for input(s): "DDIMER" in the last 72 hours. No results for input(s): "HGBA1C" in the last 72 hours. No results for input(s): "CHOL", "HDL", "LDLCALC", "TRIG", "CHOLHDL", "LDLDIRECT" in the last 72 hours. No results for input(s): "TSH", "T4TOTAL", "T3FREE", "THYROIDAB" in the last 72 hours.  Invalid input(s): "FREET3" Recent Labs    12/02/22 1547  FERRITIN 1,056*  TIBC 209*  IRON 64   Coags: No results for input(s): "INR" in the last 72 hours.  Invalid input(s): "PT" Microbiology: Recent Results (from the past 240 hour(s))  Blood culture (routine x 2)      Status: None   Collection Time: 11/24/22 12:46 PM   Specimen: BLOOD  Result Value Ref Range Status   Specimen Description BLOOD RIGHT ANTECUBITAL  Final   Special Requests   Final    BOTTLES DRAWN AEROBIC AND ANAEROBIC Blood Culture results may not be optimal due to an inadequate volume of blood received in culture bottles   Culture   Final    NO GROWTH 5 DAYS Performed at [facility], [place] [address] [place], [place] [address]    Report Status 11/29/2022 FINAL  Final  Resp panel by RT-PCR (RSV, Flu A&B, Covid) Anterior Nasal Swab     Status: None   Collection Time: 11/24/22 12:47 PM   Specimen: Anterior Nasal Swab  Result Value Ref Range Status   SARS Coronavirus 2 by RT PCR NEGATIVE NEGATIVE Final    Comment: (NOTE) SARS-CoV-2 target nucleic acids are NOT DETECTED.  The SARS-CoV-2 RNA is generally detectable in upper respiratory specimens during the acute phase of infection. The lowest concentration of SARS-CoV-2 viral copies this assay can detect is 138 copies/mL. A negative result does not preclude SARS-Cov-2 infection and should not be used as the sole basis for treatment or other patient management decisions. A negative result may occur with  improper specimen collection/handling, submission of specimen other than nasopharyngeal swab, presence of viral mutation(s) within the areas targeted by this assay, and inadequate number of viral copies(<138 copies/mL). A negative result must be combined with clinical observations, patient history, and epidemiological information. The expected result is Negative.  Fact Sheet for Patients:  EntrepreneurPulse.com.au  Fact Sheet for Healthcare Providers:  IncredibleEmployment.be  This test is no t yet approved or cleared by the [place] FDA and  has been authorized for detection and/or diagnosis of SARS-CoV-2 by FDA under an Emergency Use Authorization (EUA). This EUA will remain  in  effect (meaning this test can be used) for the duration of the COVID-19 declaration under Section 564(b)(1) of the Act, 21 U.S.C.section 360bbb-3(b)(1), unless the authorization is terminated  or revoked  sooner.       Influenza A by PCR NEGATIVE NEGATIVE Final   Influenza B by PCR NEGATIVE NEGATIVE Final    Comment: (NOTE) The Xpert Xpress SARS-CoV-2/FLU/RSV plus assay is intended as an aid in the diagnosis of influenza from Nasopharyngeal swab specimens and should not be used as a sole basis for treatment. Nasal washings and aspirates are unacceptable for Xpert Xpress SARS-CoV-2/FLU/RSV testing.  Fact Sheet for Patients: EntrepreneurPulse.com.au  Fact Sheet for Healthcare Providers: IncredibleEmployment.be  This test is not yet approved or cleared by the [place] FDA and has been authorized for detection and/or diagnosis of SARS-CoV-2 by FDA under an Emergency Use Authorization (EUA). This EUA will remain in effect (meaning this test can be used) for the duration of the COVID-19 declaration under Section 564(b)(1) of the Act, 21 U.S.C. section 360bbb-3(b)(1), unless the authorization is terminated or revoked.     Resp Syncytial Virus by PCR NEGATIVE NEGATIVE Final    Comment: (NOTE) Fact Sheet for Patients: EntrepreneurPulse.com.au  Fact Sheet for Healthcare Providers: IncredibleEmployment.be  This test is not yet approved or cleared by the [place] FDA and has been authorized for detection and/or diagnosis of SARS-CoV-2 by FDA under an Emergency Use Authorization (EUA). This EUA will remain in effect (meaning this test can be used) for the duration of the COVID-19 declaration under Section 564(b)(1) of the Act, 21 U.S.C. section 360bbb-3(b)(1), unless the authorization is terminated or revoked.  Performed at [facility], [place] [address] [place], [place] [address]   Blood culture  (routine x 2)     Status: None   Collection Time: 11/25/22  2:18 AM   Specimen: BLOOD LEFT ARM  Result Value Ref Range Status   Specimen Description BLOOD LEFT ARM  Final   Special Requests   Final    BOTTLES DRAWN AEROBIC AND ANAEROBIC Blood Culture results may not be optimal due to an excessive volume of blood received in culture bottles   Culture   Final    NO GROWTH 5 DAYS Performed at [facility], [place] [address] [place], [place] [address]    Report Status 11/30/2022 FINAL  Final  MRSA Next Gen by PCR, Nasal     Status: Abnormal   Collection Time: 11/30/22  5:05 PM   Specimen: Nasal Mucosa; Nasal Swab  Result Value Ref Range Status   MRSA by PCR Next Gen DETECTED (A) NOT DETECTED Final    Comment: RESULT CALLED TO, READ BACK BY AND VERIFIED WITH: RN NICOLE ATKINS ON 11/30/22 @ 1951 BY DRT (NOTE) The GeneXpert MRSA Assay (FDA approved for NASAL specimens only), is one component of a comprehensive MRSA colonization surveillance program. It is not intended to diagnose MRSA infection nor to guide or monitor treatment for MRSA infections. Test performance is not FDA approved in patients less than 73 years old. Performed at [facility], [place] [address] [place], [place] [address]     FURTHER DISCHARGE INSTRUCTIONS:  Get Medicines reviewed and adjusted: Please take all your medications with you for your next visit with your Primary MD  Laboratory/radiological data: Please request your Primary MD to go over all hospital tests and procedure/radiological results at the follow up, please ask your Primary MD to get all Hospital records sent to his/her office.  In some cases, they will be blood work, cultures and biopsy results pending at the time of your discharge. Please request that your primary care M.D. goes through all the records  of your hospital data and follows up on these results.  Also Note the following: If you experience worsening of your admission symptoms,  develop shortness of breath, life threatening emergency, suicidal or homicidal thoughts you must seek medical attention immediately by calling 911 or calling your MD immediately  if symptoms less severe.  You must read complete instructions/literature along with all the possible adverse reactions/side effects for all the Medicines you take and that have been prescribed to you. Take any new Medicines after you have completely understood and accpet all the possible adverse reactions/side effects.   Do not drive when taking Pain medications or sleeping medications (Benzodaizepines)  Do not take more than prescribed Pain, Sleep and Anxiety Medications. It is not advisable to combine anxiety,sleep and pain medications without talking with your primary care practitioner  Special Instructions: If you have smoked or chewed Tobacco  in the last 2 yrs please stop smoking, stop any regular Alcohol  and or any Recreational drug use.  Wear Seat belts while driving.  Please note: You were cared for by a hospitalist during your hospital stay. Once you are discharged, your primary care physician will handle any further medical issues. Please note that NO REFILLS for any discharge medications will be authorized once you are discharged, as it is imperative that you return to your primary care physician (or establish a relationship with a primary care physician if you do not have one) for your post hospital discharge needs so that they can reassess your need for medications and monitor your lab values.  Total Time spent coordinating discharge including counseling, education and face to face time equals greater than 30 minutes.  SignedOren Binet 12/03/2022 11:43 AM

## 2022-12-03 NOTE — Procedures (Signed)
HD Note:  Some information was entered later than the data was gathered due to patient care needs. The stated time with the data is accurate.Received patient in bed to unit.  Alert and oriented.  Informed consent signed and in chart.   Patient had chest pain during treatment.  Patient described as having a Buick on his chest.  [name], PA came to bedside and assessed the patient.  The PA wrote an order of an EKG.  The charge nurse on dialysis identified that it was not written for STAT.  The charge nurse changed the order and the EKG department was paged with no response prior to the patient being taken back to his room.  Report given to the patients nurse.  She stated he was due for discharge and that if the EKG team did not perform the testing in the [name], they would be able to in his room.  She stated she would notify his physician.  The patient described his pain as lessening, now a Prius on his chest.  He stated to this writer that he gets this pain during dialysis "most of the time"  He stated he was feeling better.   Access used: Right upper arm fistula Access issues: None  Total UF removed: [facility]

## 2022-12-03 NOTE — Discharge Instructions (Signed)
Recommendations for Outpatient Follow-up:  Follow up with PCP in 1-2 weeks Please obtain CMP/CBC in one week

## 2022-12-04 LAB — GLUCOSE, CAPILLARY
Glucose-Capillary: 453 mg/dL — ABNORMAL HIGH (ref 70–99)
Glucose-Capillary: 501 mg/dL (ref 70–99)

## 2022-12-04 MED ORDER — INSULIN ASPART 100 UNIT/ML IJ SOLN
25.0000 [IU] | Freq: Once | INTRAMUSCULAR | Status: AC
  Administered 2022-12-04: 25 [IU] via SUBCUTANEOUS

## 2022-12-04 MED ORDER — INSULIN REGULAR HUMAN (CONC) 500 UNIT/ML ~~LOC~~ SOPN
76.0000 [IU] | PEN_INJECTOR | Freq: Two times a day (BID) | SUBCUTANEOUS | Status: DC
  Administered 2022-12-04: 76 [IU] via SUBCUTANEOUS

## 2022-12-04 MED ORDER — INSULIN REGULAR HUMAN (CONC) 500 UNIT/ML ~~LOC~~ SOPN: 76.0000 [IU] | PEN_INJECTOR | Freq: Two times a day (BID) | SUBCUTANEOUS | Status: DC

## 2022-12-04 NOTE — Progress Notes (Signed)
No major issues overnight CBGs on the higher side but he will be getting his morning regimen of insulin soon. Stable for discharge later today.

## 2022-12-04 NOTE — Progress Notes (Signed)
Mobility Specialist Progress Note:   12/04/22 1101  Mobility  Activity Transferred from bed to chair  Level of Assistance Standby assist, set-up cues, supervision of patient - no hands on  Assistive Device None  Activity Response Tolerated well  Mobility Referral Yes  $Mobility charge 1 Mobility   Pt received in bed and requesting assistance to transfer to chair. No complaints. Pt left in chair with all needs met and call bell in reach.   Andrey Campanile Mobility Specialist Please contact via SecureChat or  Rehab office at [phone]

## 2022-12-04 NOTE — Progress Notes (Signed)
[facility] Progress Note   Dialysis Orders: Most recent orders from [facility] (pt released from [facility] on 11/11/22) >  3h 49min   129kg  Hep 2000  P4   RUA AVG  Home meds include - amlodipine 10, asa, lipitor, phoslo 2 ac tid, coreg 12.5 bid, plavix, valium, insulin regular, zaroxolyn 20 qd, pamelor, protonix, lyrica, sertraline, flomax, demadex, trazodone, prns/ vits/ supps   Assessment/Plan: Vol overload - pt was released/ discharged from his OP HD unit 12/15 due to threatening behavior.  Last HD 2 wks ago prior to this current admission. Was on bipap briefly, is off now.  Most of extra volume now is in the legs (lt AKA), not sure but may be a chronic R HF situation.   HD access - AVG was not functional 12/28 so CCM placed temp HD cath for [place] on 12/29.  This nontunneled catheter was discontinued and we are now using his graft again ESRD - pt was discharged from his OP HD unit recently as above. HD here per TTS schedule using graft; tolerated HD 1/6 with 2.6L net UF. Next HD Tues if he's still here. SW looked for another outpatient clinic for the patient - [facility] declined. Unfortunately will need to get HD thru the ED. Started tight heparin with HD here After discharge if not accepted by another outpatient HD unit the patient will have to return to the hospital for future dialysis.  Per Renal Navigator's note from 12/29, patient has been denied by multiple clinics including [facility].  Appreciate HD SW. HTN - previously held anti-hypertensives due to hypotension earlier this admission; they were resumed cautiously at lower dosing bid with goal of not overtreating his BP which could hamper our ability to get his volume excess under control.  Nonoliguric Reduced metolazone from 20 mg daily to 10 mg daily  He is on torsemide 100 mg BID Anemia esrd - Increased aranesp to 100 mcg weekly on Tuesdays.  Obtain Iron panel  MBD ckd - CCa and phos in range, cont phoslo 2 with meals as binder.  PTSD/  hx psychosis  Disposition - HD SW looked for other dialysis units unsuccessfully; unfortunately need to dialyze through the ER  Subjective:    [facility] declined.  He had 1.4 liters UOP over 1/4 charted.  Last HD on 1/4 with 1.2 kg UF.     Review of systems:   He reports shortness of breath is better Denies nausea or vomiting  Has required oxygen here  Objective Vitals:   12/03/22 1724 12/03/22 1958 12/04/22 0011 12/04/22 0400  BP: 135/77 135/72 (!) 146/77 137/67  Pulse: 87 90 78 85  Resp: 13 15 16 14   Temp: 97.8 F (36.6 C) 98.6 F (37 C) 97.8 F (36.6 C) 97.6 F (36.4 C)  TempSrc: Oral Oral Oral Oral  SpO2: 99% 97% 98% 99%  Weight:       Physical Exam  General: Awake, alert, obese habitus, NAD HEENT - NCAT Heart: S1 and S2;no rub Lungs: Clear anteriorly; diminished bilaterally ; on 3.5 Liters oxygen via nasal cannula  Abdomen: Obese, non-tender Extremities: left AKA; right leg with 1+ edema  Neuro  alert and oriented x 3 provides hx and follows commands Dialysis Access: right AV graft with bruit   Filed Weights   12/01/22 0824 12/02/22 0500 12/03/22 1251  Weight: 129.7 kg 129.9 kg 128.1 kg    Intake/Output Summary (Last 24 hours) at 12/04/2022 0730 Last data filed at 12/04/2022 0553 Gross per 24  hour  Intake --  Output 3750 ml  Net -3750 ml     Additional Objective Labs: Basic Metabolic Panel: Recent Labs  Lab 11/29/22 1029 11/30/22 0941 12/01/22 0730 12/03/22 1814  NA 132*  --  132* 133*  K 3.6  --  3.5 4.1  CL 90*  --  90* 90*  CO2 29  --  30 29  GLUCOSE 243* 420* 140* 151*  BUN 90*  --  77* 43*  CREATININE 4.33*  --  4.28* 3.17*  CALCIUM 8.2*  --  8.6* 8.8*  PHOS 5.7*  --  5.7* 3.2   Liver Function Tests: Recent Labs  Lab 12/01/22 0730 12/03/22 1814  ALBUMIN 3.2* 3.6   No results for input(s): "LIPASE", "AMYLASE" in the last 168 hours. CBC: Recent Labs  Lab 11/29/22 1029 12/01/22 0730 12/03/22 1814  WBC 5.7 6.4 6.8  HGB 9.2* 9.6*  11.0*  HCT 30.4* 31.4* 34.3*  MCV 90.5 91.5 87.3  PLT 127* 123* 149*   Blood Culture    Component Value Date/Time   SDES BLOOD LEFT ARM 11/25/2022 0218   SPECREQUEST  11/25/2022 0218    BOTTLES DRAWN AEROBIC AND ANAEROBIC Blood Culture results may not be optimal due to an excessive volume of blood received in culture bottles   CULT  11/25/2022 0218    NO GROWTH 5 DAYS Performed at [facility] [address] [place], [place] [address]    REPTSTATUS 11/30/2022 FINAL 11/25/2022 2376    Cardiac Enzymes: No results for input(s): "CKTOTAL", "CKMB", "CKMBINDEX", "TROPONINI" in the last 168 hours. CBG: Recent Labs  Lab 12/02/22 1550 12/02/22 2122 12/03/22 0806 12/03/22 1747 12/03/22 2119  GLUCAP 281* 272* 356* 168* 316*   Iron Studies:  Recent Labs    12/02/22 1547  IRON 64  TIBC 209*  FERRITIN 1,056*   Lab Results  Component Value Date   INR 1.1 10/19/2022   INR 1.2 03/28/2021   Studies/Results: No results found.  Medications:   amLODipine  2.5 mg Oral BID   aspirin EC  81 mg Oral q AM   atorvastatin  40 mg Oral q AM   calcium acetate  1,334 mg Oral BID WC   carvedilol  6.25 mg Oral BID WC   Chlorhexidine Gluconate Cloth  6 each Topical Q0600   clopidogrel  75 mg Oral Daily   [START ON 12/06/2022] darbepoetin (ARANESP) injection - DIALYSIS  100 mcg Subcutaneous Q Tue-1800   heparin  5,000 Units Subcutaneous Q8H   insulin aspart  0-20 Units Subcutaneous TID WC   insulin regular human CONCENTRATED  72 Units Subcutaneous BID WC   metolazone  10 mg Oral Daily   mupirocin ointment   Nasal BID   nortriptyline  20 mg Oral QHS   pantoprazole  40 mg Oral QAC breakfast   pregabalin  75 mg Oral q AM   sertraline  100 mg Oral q AM   sodium chloride flush  3 mL Intravenous Q12H   tamsulosin  0.4 mg Oral Daily   torsemide  100 mg Oral BID      Jeffrey [name], Hunt Oris, MD [facility] 12/04/2022,7:30 AM  LOS: 9 days

## 2022-12-05 NOTE — Progress Notes (Signed)
Late Entry Note  Pt was d/c to home yesterday with no accepting out-pt HD clinic available. Contacted former clinic social worker this morning to inquire if a few [place] clinics had been contacted to review pt's case. Referral submitted to [facility] admissions this morning to request that several [place] clinics please review pt's referral to see if any of them are willing/able to accept pt for out-pt HD needs. It is unknown if any of these clinics will be an option for pt but will make an effort for clinics to review pt's case in the event one can accept pt. Former clinic advised pt was d/c from hospital in the event clinic is able to locate an accepting clinic/provider.   Melven Sartorius Renal Navigator [phone]

## 2022-12-08 ENCOUNTER — Other Ambulatory Visit: Payer: Self-pay

## 2022-12-08 ENCOUNTER — Emergency Department (HOSPITAL_COMMUNITY)
Admission: EM | Admit: 2022-12-08 | Discharge: 2022-12-08 | Discharge status: Against Medical Advice | Payer: 59 | Attending: Emergency Medicine | Admitting: Emergency Medicine

## 2022-12-08 DIAGNOSIS — N186 End stage renal disease: Secondary | ICD-10-CM

## 2022-12-08 DIAGNOSIS — Z7902 Long term (current) use of antithrombotics/antiplatelets: Secondary | ICD-10-CM | POA: Diagnosis not present

## 2022-12-08 DIAGNOSIS — Z5329 Procedure and treatment not carried out because of patient's decision for other reasons: Secondary | ICD-10-CM | POA: Diagnosis not present

## 2022-12-08 DIAGNOSIS — Z794 Long term (current) use of insulin: Secondary | ICD-10-CM | POA: Insufficient documentation

## 2022-12-08 DIAGNOSIS — Z7982 Long term (current) use of aspirin: Secondary | ICD-10-CM | POA: Diagnosis not present

## 2022-12-08 DIAGNOSIS — M7989 Other specified soft tissue disorders: Secondary | ICD-10-CM | POA: Diagnosis present

## 2022-12-08 DIAGNOSIS — Z992 Dependence on renal dialysis: Secondary | ICD-10-CM | POA: Insufficient documentation

## 2022-12-08 LAB — CBC WITH DIFFERENTIAL/PLATELET
Abs Immature Granulocytes: 0.12 10*3/uL — ABNORMAL HIGH (ref 0.00–0.07)
Basophils Absolute: 0 10*3/uL (ref 0.0–0.1)
Basophils Relative: 0 %
Eosinophils Absolute: 0.1 10*3/uL (ref 0.0–0.5)
Eosinophils Relative: 2 %
HCT: 30.2 % — ABNORMAL LOW (ref 39.0–52.0)
Hemoglobin: 9.7 g/dL — ABNORMAL LOW (ref 13.0–17.0)
Immature Granulocytes: 2 %
Lymphocytes Relative: 11 %
Lymphs Abs: 0.8 10*3/uL (ref 0.7–4.0)
MCH: 28.2 pg (ref 26.0–34.0)
MCHC: 32.1 g/dL (ref 30.0–36.0)
MCV: 87.8 fL (ref 80.0–100.0)
Monocytes Absolute: 0.6 10*3/uL (ref 0.1–1.0)
Monocytes Relative: 7 %
Neutro Abs: 5.8 10*3/uL (ref 1.7–7.7)
Neutrophils Relative %: 78 %
Platelets: 158 10*3/uL (ref 150–400)
RBC: 3.44 MIL/uL — ABNORMAL LOW (ref 4.22–5.81)
RDW: 13.8 % (ref 11.5–15.5)
WBC: 7.5 10*3/uL (ref 4.0–10.5)
nRBC: 0 % (ref 0.0–0.2)

## 2022-12-08 LAB — BASIC METABOLIC PANEL
Anion gap: 16 — ABNORMAL HIGH (ref 5–15)
BUN: 132 mg/dL — ABNORMAL HIGH (ref 8–23)
CO2: 26 mmol/L (ref 22–32)
Calcium: 9 mg/dL (ref 8.9–10.3)
Chloride: 90 mmol/L — ABNORMAL LOW (ref 98–111)
Creatinine, Ser: 5.77 mg/dL — ABNORMAL HIGH (ref 0.61–1.24)
GFR, Estimated: 10 mL/min — ABNORMAL LOW (ref >60)
Glucose, Bld: 195 mg/dL — ABNORMAL HIGH (ref 70–99)
Potassium: 3.7 mmol/L (ref 3.5–5.1)
Sodium: 132 mmol/L — ABNORMAL LOW (ref 135–145)

## 2022-12-08 MED ORDER — CHLORHEXIDINE GLUCONATE CLOTH 2 % EX PADS: 6.0000 | MEDICATED_PAD | Freq: Every day | CUTANEOUS | Status: DC

## 2022-12-08 NOTE — ED Provider Notes (Addendum)
[facility] EMERGENCY DEPARTMENT Provider Note   CSN: [id number] Arrival date & time: 12/08/22  0745     History  Chief Complaint  Patient presents with   Vascular Access Problem   Leg Swelling    Jeffrey [name] is a 62 y.o. male.  HPI 62 year old male with a history of ESRD on dialysis, T/TH/Sa presents needing dialysis.  According to patient and chart review he has been told to come to the ER for his dialysis.  He last had it on Saturday (today is Thursday) he did not have transportation to come here on Tuesday.  He feels like he is getting a little swollen like in his arm and leg but denies any chest pain or shortness of breath.  He is chronically on oxygen.  Home Medications Prior to Admission medications   Medication Sig Start Date End Date Taking? Authorizing Provider  amLODipine (NORVASC) 10 MG tablet Take 10 mg by mouth every morning. 04/17/20   [provider]  ASPIRIN LOW DOSE 81 MG EC tablet Take 81 mg by mouth in the morning. 02/22/21   [provider]  atorvastatin (LIPITOR) 40 MG tablet Take 40 mg by mouth in the morning. 02/09/21   [provider]  calcium acetate (PHOSLO) 667 MG capsule Take 2 capsules (1,334 mg total) by mouth with breakfast, with lunch, and with evening meal. 04/10/21   Debbe Odea, MD  carvedilol (COREG) 12.5 MG tablet Take 12.5 mg by mouth 2 (two) times daily with a meal. 04/17/20   [provider]  Cholecalciferol (VITAMIN D) 50 MCG (2000 UT) CAPS Take 2,000 Units by mouth daily.    [provider]  clopidogrel (PLAVIX) 75 MG tablet Take 1 tablet (75 mg total) by mouth daily. 11/02/22   Rhyne, Hulen Shouts, PA-C  diazepam (VALIUM) 5 MG tablet Take 5 mg by mouth 2 (two) times daily as needed for muscle spasms. 12/09/21   [provider]  diphenhydrAMINE (BENADRYL) 25 MG tablet Take 50 mg by mouth every 6 (six) hours as needed for itching.    [provider]  insulin regular  human CONCENTRATED (HUMULIN R U-500 KWIKPEN) 500 UNIT/ML KwikPen Inject 45 Units into the skin daily with supper. Patient taking differently: Inject 140 Units into the skin 2 (two) times daily with a meal. 09/04/21 08/30/24  [name], MD  metolazone (ZAROXOLYN) 10 MG tablet Take 10 mg by mouth daily. 07/13/21   [provider]  nortriptyline (PAMELOR) 10 MG capsule Take 20 mg by mouth at bedtime. 10/23/19 08/30/24  [provider]  oxymetazoline (AFRIN) 0.05 % nasal spray Place 1 spray into both nostrils daily as needed (nose bleeds).    [provider]  pantoprazole (PROTONIX) 40 MG tablet Take 40 mg by mouth daily before breakfast. 02/09/21   [provider]  pregabalin (LYRICA) 75 MG capsule Take 75 mg by mouth See admin instructions. Daily.  Give after dialysis on dialysis days. 08/22/22   [provider]  sertraline (ZOLOFT) 100 MG tablet Take 100 mg by mouth in the morning. 02/22/21   [provider]  sodium chloride (OCEAN) 0.65 % SOLN nasal spray Place 2 sprays into both nostrils in the morning and at bedtime.    [provider]  tadalafil (CIALIS) 20 MG tablet Take 20 mg by mouth daily as needed for erectile dysfunction.    [provider]  tamsulosin (FLOMAX) 0.4 MG CAPS capsule Take 1 capsule (0.4 mg total) by mouth  daily. 11/10/21   Darliss Cheney, MD  torsemide (DEMADEX) 20 MG tablet Take 100 mg by mouth 2 (two) times daily.    [provider]  traZODone (DESYREL) 150 MG tablet Take 150 mg by mouth at bedtime. 12/09/21   [provider]      Allergies    Actos [pioglitazone], Dexmedetomidine, Ibuprofen, Tomato, and Wellbutrin [bupropion]    Review of Systems   Review of Systems  Respiratory:  Negative for shortness of breath.   Cardiovascular:  Positive for leg swelling. Negative for chest pain.    Physical Exam Updated Vital Signs BP 94/68   Pulse 76   Temp 97.8 F (36.6 C)   Resp  16   Ht 5\' 5"  (1.651 m)   Wt 129 kg   SpO2 100%   BMI 47.33 kg/m  Physical Exam Vitals and nursing note reviewed.  Constitutional:      Appearance: He is well-developed. He is obese. He is not ill-appearing.  HENT:     Head: Normocephalic and atraumatic.  Cardiovascular:     Rate and Rhythm: Normal rate and regular rhythm.     Heart sounds: Normal heart sounds.  Pulmonary:     Effort: Pulmonary effort is normal.     Breath sounds: Normal breath sounds.  Skin:    General: Skin is warm and dry.  Neurological:     Mental Status: He is alert.     ED Results / Procedures / Treatments   Labs (all labs ordered are listed, but only abnormal results are displayed) Labs Reviewed  CBC WITH DIFFERENTIAL/PLATELET - Abnormal; Notable for the following components:      Result Value   RBC 3.44 (*)    Hemoglobin 9.7 (*)    HCT 30.2 (*)    Abs Immature Granulocytes 0.12 (*)    All other components within normal limits  BASIC METABOLIC PANEL - Abnormal; Notable for the following components:   Sodium 132 (*)    Chloride 90 (*)    Glucose, Bld 195 (*)    BUN 132 (*)    Creatinine, Ser 5.77 (*)    GFR, Estimated 10 (*)    Anion gap 16 (*)    All other components within normal limits    EKG None  Radiology No results found.  Procedures Procedures    Medications Ordered in ED Medications  Chlorhexidine Gluconate Cloth 2 % PADS 6 each (has no administration in time range)    ED Course/ Medical Decision Making/ A&P                           Medical Decision Making Amount and/or Complexity of Data Reviewed External Data Reviewed: notes.    Details: Discharge summary notes that the patient has nowhere to get dialysis but in the emergency department and has been instructed to come here. Labs: ordered.    Details: Chronic anemia.  Chronic kidney disease without significant electrolyte disturbance.   Presents for dialysis.  He has missed 1 session but overall does not appear  ill.  He is chronically on oxygen but not a higher dose.  Does not feel short of breath.  Is not hypoxic.  At this point I have discussed with Dr. Jonnie Finner and he will get dialysis today and then come back to the ED.  Will need reassessment but hopefully can be discharged.   Addendum: 3:33 PM I was informed the patient is leaving [facility].  States he has to get a ride and the person will be able to stay for him to get dialysis.  He was encouraged to stay but still wants to leave.  He understands the risk of leaving including and up to death.     Final Clinical Impression(s) / ED Diagnoses Final diagnoses:  ESRD (end stage renal disease) on dialysis [facility])    Rx / DC Orders ED Discharge Orders     None         Sherwood Gambler, MD 12/08/22 1455    Sherwood Gambler, MD 12/08/22 [phone]

## 2022-12-08 NOTE — ED Notes (Signed)
NT spoke with pt about his refusal of dialysis and attempting to leave ama. Pt voiced that he understood that missing dialysis was dangerous and that he was more concerned on his transportation than receiving dialysis today. Pt stated that he will be back on Saturday to continue his treatment. RN and MD notified. AMA form signed by Pt.

## 2022-12-08 NOTE — Discharge Instructions (Signed)
You are leaving [facility].  It was recommended that you stay for dialysis.  It is very important to get dialysis regularly as you are kidneys cannot filter out certain products in your blood.  If at any point you change your mind or if you develop new or worsening symptoms such as chest pain, shortness of breath, weakness, lightheadedness, or any other new/concerning symptoms then you should return to the hospital or call 911.  You may return at any time.

## 2022-12-08 NOTE — ED Triage Notes (Signed)
Pt. Stated, I need dialysis. My last treatment was on Sunday. Needs to find a new one, pt stated, we did not agree on some things so I have to find another one. My right arm is a little tight and I have some fluid build up

## 2022-12-08 NOTE — ED Notes (Signed)
Patient refused to go to dialysis because he does not want to miss his transportation home that stops at 5pm. Patient was educated on why he needed dialysis. He was offered another transportation home since it would be after hours but he denied the offer his transportation was already here. Patient left AMA and signed the form.

## 2022-12-10 ENCOUNTER — Emergency Department (HOSPITAL_COMMUNITY)
Admission: EM | Admit: 2022-12-10 | Discharge: 2022-12-10 | Disposition: A | Discharge status: Home / Self Care | Payer: 59 | Attending: Emergency Medicine | Admitting: Emergency Medicine

## 2022-12-10 ENCOUNTER — Other Ambulatory Visit: Payer: Self-pay

## 2022-12-10 ENCOUNTER — Encounter (HOSPITAL_COMMUNITY): Payer: Self-pay

## 2022-12-10 DIAGNOSIS — Z992 Dependence on renal dialysis: Secondary | ICD-10-CM | POA: Diagnosis not present

## 2022-12-10 DIAGNOSIS — N186 End stage renal disease: Secondary | ICD-10-CM | POA: Insufficient documentation

## 2022-12-10 LAB — COMPREHENSIVE METABOLIC PANEL WITH GFR
ALT: 15 U/L (ref 0–44)
AST: 15 U/L (ref 15–41)
Albumin: 3.6 g/dL (ref 3.5–5.0)
Alkaline Phosphatase: 71 U/L (ref 38–126)
Anion gap: 16 — ABNORMAL HIGH (ref 5–15)
BUN: 154 mg/dL — ABNORMAL HIGH (ref 8–23)
CO2: 28 mmol/L (ref 22–32)
Calcium: 8.6 mg/dL — ABNORMAL LOW (ref 8.9–10.3)
Chloride: 90 mmol/L — ABNORMAL LOW (ref 98–111)
Creatinine, Ser: 5.83 mg/dL — ABNORMAL HIGH (ref 0.61–1.24)
GFR, Estimated: 10 mL/min — ABNORMAL LOW
Glucose, Bld: 58 mg/dL — ABNORMAL LOW (ref 70–99)
Potassium: 3.6 mmol/L (ref 3.5–5.1)
Sodium: 134 mmol/L — ABNORMAL LOW (ref 135–145)
Total Bilirubin: 0.2 mg/dL — ABNORMAL LOW (ref 0.3–1.2)
Total Protein: 7.8 g/dL (ref 6.5–8.1)

## 2022-12-10 LAB — CBC WITH DIFFERENTIAL/PLATELET
Abs Immature Granulocytes: 0.15 K/uL — ABNORMAL HIGH (ref 0.00–0.07)
Basophils Absolute: 0 K/uL (ref 0.0–0.1)
Basophils Relative: 0 %
Eosinophils Absolute: 0.2 K/uL (ref 0.0–0.5)
Eosinophils Relative: 2 %
HCT: 29.7 % — ABNORMAL LOW (ref 39.0–52.0)
Hemoglobin: 10 g/dL — ABNORMAL LOW (ref 13.0–17.0)
Immature Granulocytes: 2 %
Lymphocytes Relative: 12 %
Lymphs Abs: 1 K/uL (ref 0.7–4.0)
MCH: 28.9 pg (ref 26.0–34.0)
MCHC: 33.7 g/dL (ref 30.0–36.0)
MCV: 85.8 fL (ref 80.0–100.0)
Monocytes Absolute: 0.6 K/uL (ref 0.1–1.0)
Monocytes Relative: 8 %
Neutro Abs: 5.9 K/uL (ref 1.7–7.7)
Neutrophils Relative %: 76 %
Platelets: 156 K/uL (ref 150–400)
RBC: 3.46 MIL/uL — ABNORMAL LOW (ref 4.22–5.81)
RDW: 13.9 % (ref 11.5–15.5)
WBC: 7.9 K/uL (ref 4.0–10.5)
nRBC: 0 % (ref 0.0–0.2)

## 2022-12-10 MED ORDER — HEPARIN SODIUM (PORCINE) 1000 UNIT/ML DIALYSIS: 1000.0000 [IU] | INTRAMUSCULAR | Status: DC | PRN

## 2022-12-10 MED ORDER — CHLORHEXIDINE GLUCONATE CLOTH 2 % EX PADS: 6.0000 | MEDICATED_PAD | Freq: Every day | CUTANEOUS | Status: DC

## 2022-12-10 MED ORDER — HEPARIN SODIUM (PORCINE) 1000 UNIT/ML DIALYSIS: 2000.0000 [IU] | INTRAMUSCULAR | Status: DC | PRN

## 2022-12-10 MED ORDER — PENTAFLUOROPROP-TETRAFLUOROETH EX AERO: 1.0000 | INHALATION_SPRAY | CUTANEOUS | Status: DC | PRN

## 2022-12-10 MED ORDER — LIDOCAINE-PRILOCAINE 2.5-2.5 % EX CREA: 1.0000 | TOPICAL_CREAM | CUTANEOUS | Status: DC | PRN

## 2022-12-10 MED ORDER — ALTEPLASE 2 MG IJ SOLR: 2.0000 mg | Freq: Once | INTRAMUSCULAR | Status: DC | PRN

## 2022-12-10 MED ORDER — LIDOCAINE HCL (PF) 1 % IJ SOLN: 5.0000 mL | INTRAMUSCULAR | Status: DC | PRN

## 2022-12-10 NOTE — ED Provider Notes (Signed)
[facility] EMERGENCY DEPARTMENT Provider Note   CSN: [id number] Arrival date & time: 12/10/22  0744     History  Chief Complaint  Patient presents with   missed dialysis    Jeffrey [name] is a 62 y.o. male.  Patient with history of end-stage renal disease on hemodialysis presents to the emergency department for dialysis.  He has not had dialysis in 1 week.  He reports increased lower extremity swelling and shortness of breath with exertion.  Denies confusion or vomiting.  Patient presented to the emergency department 2 days ago and plan was for dialysis, however he had to leave before transportation stopped running at 5 PM and did not receive dialysis.  Denies other complaints.  States that he does not currently have a dialysis home.       Home Medications Prior to Admission medications   Medication Sig Start Date End Date Taking? Authorizing Provider  amLODipine (NORVASC) 10 MG tablet Take 10 mg by mouth every morning. 04/17/20   [provider]  ASPIRIN LOW DOSE 81 MG EC tablet Take 81 mg by mouth in the morning. 02/22/21   [provider]  atorvastatin (LIPITOR) 40 MG tablet Take 40 mg by mouth in the morning. 02/09/21   [provider]  calcium acetate (PHOSLO) 667 MG capsule Take 2 capsules (1,334 mg total) by mouth with breakfast, with lunch, and with evening meal. 04/10/21   Debbe Odea, MD  carvedilol (COREG) 12.5 MG tablet Take 12.5 mg by mouth 2 (two) times daily with a meal. 04/17/20   [provider]  Cholecalciferol (VITAMIN D) 50 MCG (2000 UT) CAPS Take 2,000 Units by mouth daily.    [provider]  clopidogrel (PLAVIX) 75 MG tablet Take 1 tablet (75 mg total) by mouth daily. 11/02/22   Rhyne, Hulen Shouts, PA-C  diazepam (VALIUM) 5 MG tablet Take 5 mg by mouth 2 (two) times daily as needed for muscle spasms. 12/09/21   [provider]  diphenhydrAMINE (BENADRYL) 25 MG tablet Take 50 mg by mouth every 6  (six) hours as needed for itching.    [provider]  insulin regular human CONCENTRATED (HUMULIN R U-500 KWIKPEN) 500 UNIT/ML KwikPen Inject 45 Units into the skin daily with supper. Patient taking differently: Inject 140 Units into the skin 2 (two) times daily with a meal. 09/04/21 08/30/24  [name], MD  metolazone (ZAROXOLYN) 10 MG tablet Take 10 mg by mouth daily. 07/13/21   [provider]  nortriptyline (PAMELOR) 10 MG capsule Take 20 mg by mouth at bedtime. 10/23/19 08/30/24  [provider]  oxymetazoline (AFRIN) 0.05 % nasal spray Place 1 spray into both nostrils daily as needed (nose bleeds).    [provider]  pantoprazole (PROTONIX) 40 MG tablet Take 40 mg by mouth daily before breakfast. 02/09/21   [provider]  pregabalin (LYRICA) 75 MG capsule Take 75 mg by mouth See admin instructions. Daily.  Give after dialysis on dialysis days. 08/22/22   [provider]  sertraline (ZOLOFT) 100 MG tablet Take 100 mg by mouth in the morning. 02/22/21   [provider]  sodium chloride (OCEAN) 0.65 % SOLN nasal spray Place 2 sprays into both nostrils in the morning and at bedtime.    [provider]  tadalafil (CIALIS) 20 MG tablet Take 20 mg by mouth daily as needed for erectile dysfunction.    [provider]  tamsulosin (FLOMAX) 0.4 MG CAPS capsule Take 1  capsule (0.4 mg total) by mouth daily. 11/10/21   Darliss Cheney, MD  torsemide (DEMADEX) 20 MG tablet Take 100 mg by mouth 2 (two) times daily.    [provider]  traZODone (DESYREL) 150 MG tablet Take 150 mg by mouth at bedtime. 12/09/21   [provider]      Allergies    Actos [pioglitazone], Dexmedetomidine, Ibuprofen, Tomato, and Wellbutrin [bupropion]    Review of Systems   Review of Systems  Physical Exam Updated Vital Signs BP (!) 133/53 (BP Location: Left Arm)   Pulse 81   Temp 98.1 F (36.7 C)   Resp 18   SpO2 100%    Physical Exam Vitals and nursing note reviewed.  Constitutional:      General: He is not in acute distress.    Appearance: He is well-developed.  HENT:     Head: Normocephalic and atraumatic.  Eyes:     General:        Right eye: No discharge.        Left eye: No discharge.     Conjunctiva/sclera: Conjunctivae normal.  Cardiovascular:     Rate and Rhythm: Normal rate and regular rhythm.     Heart sounds: Normal heart sounds.  Pulmonary:     Effort: Pulmonary effort is normal.     Breath sounds: Rales (Mild, bases) present.  Abdominal:     Palpations: Abdomen is soft.     Tenderness: There is no abdominal tenderness.  Musculoskeletal:     Cervical back: Normal range of motion and neck supple.  Skin:    General: Skin is warm and dry.  Neurological:     Mental Status: He is alert.     ED Results / Procedures / Treatments   Labs (all labs ordered are listed, but only abnormal results are displayed) Labs Reviewed  COMPREHENSIVE METABOLIC PANEL - Abnormal; Notable for the following components:      Result Value   Sodium 134 (*)    Chloride 90 (*)    Glucose, Bld 58 (*)    BUN 154 (*)    Creatinine, Ser 5.83 (*)    Calcium 8.6 (*)    Total Bilirubin 0.2 (*)    GFR, Estimated 10 (*)    Anion gap 16 (*)    All other components within normal limits  CBC WITH DIFFERENTIAL/PLATELET - Abnormal; Notable for the following components:   RBC 3.46 (*)    Hemoglobin 10.0 (*)    HCT 29.7 (*)    Abs Immature Granulocytes 0.15 (*)    All other components within normal limits    EKG None  Radiology No results found.  Procedures Procedures    Medications Ordered in ED Medications - No data to display  ED Course/ Medical Decision Making/ A&P    Patient seen and examined. History obtained directly from patient.  Reviewed recent ED visit.  Work-up including labs, imaging, EKG ordered in triage, if performed, were reviewed.    Labs/EKG: Independently reviewed and  interpreted.  This included: CBC with normal white blood cell count, hemoglobin 10.0 stable due to anemia of chronic disease most likely; CMP with potassium 3.6, glucose 58, BUN 154, mildly elevated anion gap at 16 likely due to uremia.  Imaging: None ordered  Medications/Fluids: None ordered  Most recent vital signs reviewed and are as follows: BP (!) 133/53 (BP Location: Left Arm)   Pulse 81   Temp 98.1 F (36.7 C)   Resp 18  SpO2 100%   Initial impression: Complications due to missing dialysis, uremia without overt confusion, mild signs of fluid overload with lower extremity edema and shortness of breath with exertion.  Currently stable on 2 L nasal cannula.  12:12 PM I consulted with Dr. Jonnie Finner of nephrology by telephone.  He is aware of patient in the emergency department and will attempt to arrange for dialysis.  Current plan is for dialysis, likely discharge home afterwards.                             Medical Decision Making Amount and/or Complexity of Data Reviewed Labs: ordered.   Patient with ESRD, reportedly no routine dialysis center at the current time.  He has not had dialysis in 1 week.  Potassium is okay.  BUN is very elevated but he is mentating well.  He does show signs of mild fluid overload but is in no distress.  No signs of infection, ACS or other problems at this time.        Final Clinical Impression(s) / ED Diagnoses Final diagnoses:  ESRD (end stage renal disease) on dialysis [facility])    Rx / DC Orders ED Discharge Orders     None         Carlisle Cater, Hershal Coria 12/10/22 1213    Fransico Meadow, MD 12/10/22 1904

## 2022-12-10 NOTE — ED Provider Notes (Signed)
Received patient care after he returned from dialysis.  Patient is comfortable appearing, conversational, appropriate vital signs.  No complaints.  Plan for outpatient follow-up.   Lorelle Gibbs, DO 12/10/22 1641

## 2022-12-10 NOTE — ED Notes (Signed)
Pt states he does not need to wait for discharge, "I was just here for dialysis and my ride is out front.".  This RN explained to pt that he is a patient in the ED and needs to be cleared for discharge.  Pt transferred himself to his wheelchair and left at this time.

## 2022-12-10 NOTE — Procedures (Signed)
Pt seen in HD. Pt is w/o an OP HD unit due to behavior issues. He comes to ED for dialysis. Pt is w/o complaints.  HD orders for 3.5 hrs and 3 L uf as tolerated.  Pt is not in distress. If pt fully admitted will do formal consult.     I was present at this dialysis session, have reviewed the session itself and made  appropriate changes Kelly Splinter MD [facility] pager [phone]   12/10/2022, 3:52 PM

## 2022-12-10 NOTE — Progress Notes (Signed)
Received patient in bed to unit.  Alert and oriented.  Informed consent signed and in chart.   Treatment initiated: 1316 Treatment completed: 1554  Patient tolerated well.  Transported back to the room  Alert, without acute distress.  Hand-off given to patient's nurse.   Access used: AVF Access issues: None  Total UF removed: 2.4L Medication(s) given: none Post HD VS: 161/75,92,16,100% Post HD weight: Unable to weigh   [name]
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
## 2022-12-13 ENCOUNTER — Other Ambulatory Visit: Payer: Self-pay

## 2022-12-13 ENCOUNTER — Emergency Department (HOSPITAL_COMMUNITY)
Admission: EM | Admit: 2022-12-13 | Discharge: 2022-12-13 | Discharge status: Against Medical Advice | Payer: 59 | Attending: Emergency Medicine | Admitting: Emergency Medicine

## 2022-12-13 ENCOUNTER — Encounter (HOSPITAL_COMMUNITY): Payer: Self-pay

## 2022-12-13 DIAGNOSIS — N186 End stage renal disease: Secondary | ICD-10-CM | POA: Diagnosis not present

## 2022-12-13 DIAGNOSIS — Z4932 Encounter for adequacy testing for peritoneal dialysis: Secondary | ICD-10-CM | POA: Insufficient documentation

## 2022-12-13 DIAGNOSIS — Z5321 Procedure and treatment not carried out due to patient leaving prior to being seen by health care provider: Secondary | ICD-10-CM | POA: Insufficient documentation

## 2022-12-13 DIAGNOSIS — Z992 Dependence on renal dialysis: Secondary | ICD-10-CM | POA: Insufficient documentation

## 2022-12-13 LAB — COMPREHENSIVE METABOLIC PANEL
ALT: 13 U/L (ref 0–44)
AST: 14 U/L — ABNORMAL LOW (ref 15–41)
Albumin: 3.5 g/dL (ref 3.5–5.0)
Alkaline Phosphatase: 70 U/L (ref 38–126)
Anion gap: 13 (ref 5–15)
BUN: 124 mg/dL — ABNORMAL HIGH (ref 8–23)
CO2: 29 mmol/L (ref 22–32)
Calcium: 8.5 mg/dL — ABNORMAL LOW (ref 8.9–10.3)
Chloride: 94 mmol/L — ABNORMAL LOW (ref 98–111)
Creatinine, Ser: 4.87 mg/dL — ABNORMAL HIGH (ref 0.61–1.24)
GFR, Estimated: 13 mL/min — ABNORMAL LOW (ref >60)
Glucose, Bld: 161 mg/dL — ABNORMAL HIGH (ref 70–99)
Potassium: 3.4 mmol/L — ABNORMAL LOW (ref 3.5–5.1)
Sodium: 136 mmol/L (ref 135–145)
Total Bilirubin: 0.2 mg/dL — ABNORMAL LOW (ref 0.3–1.2)
Total Protein: 7.2 g/dL (ref 6.5–8.1)

## 2022-12-13 NOTE — ED Triage Notes (Signed)
Pt arrived vis POV to receive dialysis. States that he does not have a regular center yet & is still looking for one he can go to routinely on T/Th/Sa. He states he has not missed a day yet & his access is in the Rt arm. Denies any pain upon arrival.
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
## 2022-12-13 NOTE — ED Notes (Signed)
Pt sts he is leaving with his transportation. Pt sts he can't wait bc he wont have transportation home after 5

## 2022-12-17 ENCOUNTER — Encounter (HOSPITAL_COMMUNITY): Payer: Self-pay | Admitting: Emergency Medicine

## 2022-12-17 ENCOUNTER — Emergency Department (HOSPITAL_COMMUNITY): Payer: 59

## 2022-12-17 ENCOUNTER — Emergency Department (HOSPITAL_COMMUNITY)
Admission: EM | Admit: 2022-12-17 | Discharge: 2022-12-17 | Disposition: A | Discharge status: Home / Self Care | Payer: 59 | Attending: Emergency Medicine | Admitting: Emergency Medicine

## 2022-12-17 ENCOUNTER — Other Ambulatory Visit: Payer: Self-pay

## 2022-12-17 DIAGNOSIS — J449 Chronic obstructive pulmonary disease, unspecified: Secondary | ICD-10-CM | POA: Diagnosis not present

## 2022-12-17 DIAGNOSIS — Z992 Dependence on renal dialysis: Secondary | ICD-10-CM | POA: Diagnosis present

## 2022-12-17 DIAGNOSIS — Z79899 Other long term (current) drug therapy: Secondary | ICD-10-CM | POA: Diagnosis not present

## 2022-12-17 DIAGNOSIS — Z7902 Long term (current) use of antithrombotics/antiplatelets: Secondary | ICD-10-CM | POA: Diagnosis not present

## 2022-12-17 DIAGNOSIS — I509 Heart failure, unspecified: Secondary | ICD-10-CM | POA: Insufficient documentation

## 2022-12-17 DIAGNOSIS — N186 End stage renal disease: Secondary | ICD-10-CM | POA: Diagnosis not present

## 2022-12-17 DIAGNOSIS — Z7982 Long term (current) use of aspirin: Secondary | ICD-10-CM | POA: Insufficient documentation

## 2022-12-17 LAB — BASIC METABOLIC PANEL
Anion gap: 13 (ref 5–15)
BUN: 125 mg/dL — ABNORMAL HIGH (ref 8–23)
CO2: 28 mmol/L (ref 22–32)
Calcium: 8.2 mg/dL — ABNORMAL LOW (ref 8.9–10.3)
Chloride: 95 mmol/L — ABNORMAL LOW (ref 98–111)
Creatinine, Ser: 4.61 mg/dL — ABNORMAL HIGH (ref 0.61–1.24)
GFR, Estimated: 14 mL/min — ABNORMAL LOW (ref >60)
Glucose, Bld: 128 mg/dL — ABNORMAL HIGH (ref 70–99)
Potassium: 3.6 mmol/L (ref 3.5–5.1)
Sodium: 136 mmol/L (ref 135–145)

## 2022-12-17 LAB — CBC
HCT: 30.1 % — ABNORMAL LOW (ref 39.0–52.0)
Hemoglobin: 9.4 g/dL — ABNORMAL LOW (ref 13.0–17.0)
MCH: 27.9 pg (ref 26.0–34.0)
MCHC: 31.2 g/dL (ref 30.0–36.0)
MCV: 89.3 fL (ref 80.0–100.0)
Platelets: 108 10*3/uL — ABNORMAL LOW (ref 150–400)
RBC: 3.37 MIL/uL — ABNORMAL LOW (ref 4.22–5.81)
RDW: 14.1 % (ref 11.5–15.5)
WBC: 7.7 10*3/uL (ref 4.0–10.5)
nRBC: 0 % (ref 0.0–0.2)

## 2022-12-17 MED ORDER — PENTAFLUOROPROP-TETRAFLUOROETH EX AERO: 1.0000 | INHALATION_SPRAY | CUTANEOUS | Status: DC | PRN

## 2022-12-17 MED ORDER — ALTEPLASE 2 MG IJ SOLR: 2.0000 mg | Freq: Once | INTRAMUSCULAR | Status: DC | PRN

## 2022-12-17 MED ORDER — HEPARIN SODIUM (PORCINE) 1000 UNIT/ML DIALYSIS
1000.0000 [IU] | INTRAMUSCULAR | Status: DC | PRN
  Administered 2022-12-17: 2000 [IU]
  Filled 2022-12-17: qty 1

## 2022-12-17 MED ORDER — LIDOCAINE HCL (PF) 1 % IJ SOLN: 5.0000 mL | INTRAMUSCULAR | Status: DC | PRN

## 2022-12-17 MED ORDER — CHLORHEXIDINE GLUCONATE CLOTH 2 % EX PADS: 6.0000 | MEDICATED_PAD | Freq: Every day | CUTANEOUS | Status: DC

## 2022-12-17 MED ORDER — LIDOCAINE-PRILOCAINE 2.5-2.5 % EX CREA: 1.0000 | TOPICAL_CREAM | CUTANEOUS | Status: DC | PRN

## 2022-12-17 MED ORDER — ANTICOAGULANT SODIUM CITRATE 4% (200MG/5ML) IV SOLN: 5.0000 mL | Status: DC | PRN

## 2022-12-17 MED ORDER — HEPARIN SODIUM (PORCINE) 1000 UNIT/ML DIALYSIS
2000.0000 [IU] | INTRAMUSCULAR | Status: DC | PRN
  Filled 2022-12-17: qty 2
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
## 2022-12-17 NOTE — Plan of Care (Addendum)
Jeffrey [name] January 16, 1961 [id number]  ESRD patient in the ED in need of dialysis.  Patient has been discharged from outpatient dialysis center due to safety issue.  Patient requires intermittent dialysis in the ED.  Orders written for HD today with plan for return to the ED post treatment for evaluation and likely discharge if no medical reason for admission.    Most recent orders from [facility] (pt released from [facility] on 11/11/22) >  3h 71min   129kg  Hep 2000  P4   RUA AVG  Jen Mow, PA-C [employer]

## 2022-12-17 NOTE — Progress Notes (Signed)
Received patient in bed to unit.  Alert and oriented.  Informed consent signed and in chart.   Treatment initiated: 1500 Treatment completed: 3128  Patient not tolerated well. Pt cut off 1.5 hrs d/t bad cramping. Dr. Marval Regal notified. Transported back to the room  Alert, without acute distress.  Hand-off given to patient's nurse.   Access used: AVG Access issues: none  Total UF removed: 1.3 L Medication(s) given: none Post HD VS: 129/52 P 80 R 16. O2 sat 100% Post HD weight: 126 KG   [facility]

## 2022-12-17 NOTE — Discharge Instructions (Addendum)
Please follow-up with outpatient nephrology. Social was consulted to help arrange for outpatient dialysis

## 2022-12-17 NOTE — ED Notes (Signed)
Patient given sandwhich and drink.

## 2022-12-17 NOTE — ED Notes (Signed)
Report given to Dialysis nurse.   

## 2022-12-17 NOTE — ED Provider Notes (Addendum)
  Physical Exam  BP (!) 143/82 (BP Location: Left Arm)   Pulse 88   Temp 97.9 F (36.6 C) (Oral)   Resp (!) 9   SpO2 100%     Procedures  Procedures  ED Course / MDM    Medical Decision Making Amount and/or Complexity of Data Reviewed Labs: ordered. Radiology: ordered.   Returns from hemodialysis vitally stable on his baseline 3 L O2 via nasal cannula.  In no respiratory distress, lungs clear to auscultation bilaterally.  Has no complaints at this time post dialysis, had complained of leg cramping earlier which has resolved.  Has been trying to get in with a dialysis center.  Unfortunately there is no dialysis coordinator immediately available on the weekend.  Recommend that the patient come back for repeat dialysis and follow-up outpatient and/or in the ED for assistance with scheduling outpatient dialysis.         Regan Lemming, MD 12/17/22 2038

## 2022-12-17 NOTE — ED Notes (Addendum)
This RN accidentally discharged patient. Disregard departure condition in chart from 19:18.

## 2022-12-17 NOTE — ED Triage Notes (Signed)
Pt reports he needs dialysis. Reports SHOB and leg swelling. Pt stating his last dialysis was Saturday 12/10/22.

## 2022-12-20 ENCOUNTER — Other Ambulatory Visit: Payer: Self-pay

## 2022-12-20 ENCOUNTER — Emergency Department (HOSPITAL_COMMUNITY)
Admission: EM | Admit: 2022-12-20 | Discharge: 2022-12-20 | Discharge status: Against Medical Advice | Payer: 59 | Attending: Emergency Medicine | Admitting: Emergency Medicine

## 2022-12-20 DIAGNOSIS — Z9981 Dependence on supplemental oxygen: Secondary | ICD-10-CM | POA: Diagnosis not present

## 2022-12-20 DIAGNOSIS — J449 Chronic obstructive pulmonary disease, unspecified: Secondary | ICD-10-CM | POA: Diagnosis not present

## 2022-12-20 DIAGNOSIS — Z794 Long term (current) use of insulin: Secondary | ICD-10-CM | POA: Diagnosis not present

## 2022-12-20 DIAGNOSIS — I132 Hypertensive heart and chronic kidney disease with heart failure and with stage 5 chronic kidney disease, or end stage renal disease: Secondary | ICD-10-CM | POA: Diagnosis not present

## 2022-12-20 DIAGNOSIS — Z7982 Long term (current) use of aspirin: Secondary | ICD-10-CM | POA: Diagnosis not present

## 2022-12-20 DIAGNOSIS — Z5329 Procedure and treatment not carried out because of patient's decision for other reasons: Secondary | ICD-10-CM | POA: Diagnosis not present

## 2022-12-20 DIAGNOSIS — Z992 Dependence on renal dialysis: Secondary | ICD-10-CM | POA: Diagnosis not present

## 2022-12-20 DIAGNOSIS — E1122 Type 2 diabetes mellitus with diabetic chronic kidney disease: Secondary | ICD-10-CM | POA: Insufficient documentation

## 2022-12-20 DIAGNOSIS — Z79899 Other long term (current) drug therapy: Secondary | ICD-10-CM | POA: Diagnosis not present

## 2022-12-20 DIAGNOSIS — N186 End stage renal disease: Secondary | ICD-10-CM | POA: Insufficient documentation

## 2022-12-20 DIAGNOSIS — Z7902 Long term (current) use of antithrombotics/antiplatelets: Secondary | ICD-10-CM | POA: Insufficient documentation

## 2022-12-20 DIAGNOSIS — I509 Heart failure, unspecified: Secondary | ICD-10-CM | POA: Insufficient documentation

## 2022-12-20 DIAGNOSIS — Z4931 Encounter for adequacy testing for hemodialysis: Secondary | ICD-10-CM | POA: Diagnosis present

## 2022-12-20 LAB — HEPATITIS B SURFACE ANTIGEN: Hepatitis B Surface Ag: NONREACTIVE

## 2022-12-20 LAB — RENAL FUNCTION PANEL
Albumin: 3.5 g/dL (ref 3.5–5.0)
Anion gap: 15 (ref 5–15)
BUN: 102 mg/dL — ABNORMAL HIGH (ref 8–23)
CO2: 27 mmol/L (ref 22–32)
Calcium: 8.5 mg/dL — ABNORMAL LOW (ref 8.9–10.3)
Chloride: 97 mmol/L — ABNORMAL LOW (ref 98–111)
Creatinine, Ser: 4.33 mg/dL — ABNORMAL HIGH (ref 0.61–1.24)
GFR, Estimated: 15 mL/min — ABNORMAL LOW (ref >60)
Glucose, Bld: 78 mg/dL (ref 70–99)
Phosphorus: 5.5 mg/dL — ABNORMAL HIGH (ref 2.5–4.6)
Potassium: 4 mmol/L (ref 3.5–5.1)
Sodium: 139 mmol/L (ref 135–145)

## 2022-12-20 LAB — CBC WITH DIFFERENTIAL/PLATELET
Abs Immature Granulocytes: 0.07 10*3/uL (ref 0.00–0.07)
Basophils Absolute: 0 10*3/uL (ref 0.0–0.1)
Basophils Relative: 0 %
Eosinophils Absolute: 0.1 10*3/uL (ref 0.0–0.5)
Eosinophils Relative: 2 %
HCT: 28.2 % — ABNORMAL LOW (ref 39.0–52.0)
Hemoglobin: 9 g/dL — ABNORMAL LOW (ref 13.0–17.0)
Immature Granulocytes: 1 %
Lymphocytes Relative: 11 %
Lymphs Abs: 0.7 10*3/uL (ref 0.7–4.0)
MCH: 28.6 pg (ref 26.0–34.0)
MCHC: 31.9 g/dL (ref 30.0–36.0)
MCV: 89.5 fL (ref 80.0–100.0)
Monocytes Absolute: 0.5 10*3/uL (ref 0.1–1.0)
Monocytes Relative: 8 %
Neutro Abs: 4.6 10*3/uL (ref 1.7–7.7)
Neutrophils Relative %: 78 %
Platelets: 93 10*3/uL — ABNORMAL LOW (ref 150–400)
RBC: 3.15 MIL/uL — ABNORMAL LOW (ref 4.22–5.81)
RDW: 14.5 % (ref 11.5–15.5)
WBC: 6 10*3/uL (ref 4.0–10.5)
nRBC: 0 % (ref 0.0–0.2)

## 2022-12-20 LAB — BASIC METABOLIC PANEL
Anion gap: 13 (ref 5–15)
BUN: 98 mg/dL — ABNORMAL HIGH (ref 8–23)
CO2: 28 mmol/L (ref 22–32)
Calcium: 8.1 mg/dL — ABNORMAL LOW (ref 8.9–10.3)
Chloride: 96 mmol/L — ABNORMAL LOW (ref 98–111)
Creatinine, Ser: 4.39 mg/dL — ABNORMAL HIGH (ref 0.61–1.24)
GFR, Estimated: 14 mL/min — ABNORMAL LOW (ref >60)
Glucose, Bld: 173 mg/dL — ABNORMAL HIGH (ref 70–99)
Potassium: 3.8 mmol/L (ref 3.5–5.1)
Sodium: 137 mmol/L (ref 135–145)

## 2022-12-20 MED ORDER — CHLORHEXIDINE GLUCONATE CLOTH 2 % EX PADS: 6.0000 | MEDICATED_PAD | Freq: Every day | CUTANEOUS | Status: DC

## 2022-12-20 MED ORDER — HEPARIN SODIUM (PORCINE) 1000 UNIT/ML DIALYSIS: 1000.0000 [IU] | INTRAMUSCULAR | Status: DC | PRN

## 2022-12-20 MED ORDER — ALTEPLASE 2 MG IJ SOLR: 2.0000 mg | Freq: Once | INTRAMUSCULAR | Status: DC | PRN

## 2022-12-20 MED ORDER — LIDOCAINE-PRILOCAINE 2.5-2.5 % EX CREA: 1.0000 | TOPICAL_CREAM | CUTANEOUS | Status: DC | PRN

## 2022-12-20 MED ORDER — PENTAFLUOROPROP-TETRAFLUOROETH EX AERO: 1.0000 | INHALATION_SPRAY | CUTANEOUS | Status: DC | PRN

## 2022-12-20 MED ORDER — LIDOCAINE HCL (PF) 1 % IJ SOLN: 5.0000 mL | INTRAMUSCULAR | Status: DC | PRN

## 2022-12-20 MED ORDER — HEPARIN SODIUM (PORCINE) 1000 UNIT/ML DIALYSIS: 2000.0000 [IU] | INTRAMUSCULAR | Status: DC | PRN

## 2022-12-20 MED ORDER — ANTICOAGULANT SODIUM CITRATE 4% (200MG/5ML) IV SOLN: 5.0000 mL | Status: DC | PRN

## 2022-12-20 MED ORDER — HEPARIN SODIUM (PORCINE) 1000 UNIT/ML IJ SOLN
INTRAMUSCULAR | Status: AC
  Filled 2022-12-20: qty 2
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
## 2022-12-20 NOTE — ED Notes (Signed)
Patient stated they were going to lobby to wait for their ride and they no longer needed to wait to be discharged

## 2022-12-20 NOTE — ED Provider Notes (Signed)
Pt's care turned over to me.  Pt in dialysis.  Pt left dialysis after 1 hour of dialysis.  Pt left ED ama without my evaluation    Sidney Ace 12/20/22 1731    Pattricia Boss, MD 12/21/22 [phone]

## 2022-12-20 NOTE — ED Provider Triage Note (Signed)
Emergency Medicine Provider Triage Evaluation Note  Jeffrey [name] , a 62 y.o. male  was evaluated in triage.  Pt complains of needing dialysis.  Last session was 2 days ago.  He goes Tuesday, Thursday and Saturday.  No missed sessions.  Does not have an outpatient center.  Some increased shortness of breath compared to baseline at home with home oxygen.  No chest pain, cough or fever..  Review of Systems  Positive: Shortness of breath Negative: Chest pain, cough, fever  Physical Exam  BP (!) 166/66 (BP Location: Left Arm)   Pulse 90   Temp 98.1 F (36.7 C) (Oral)   Resp 20   Ht 5\' 5"  (1.651 m)   Wt 129.3 kg   SpO2 97%   BMI 47.43 kg/m  Gen:   Awake, no distress   Resp:  Normal effort  MSK:   Moves extremities without difficulty  Other:  No increased work of breathing  Medical Decision Making  Medically screening exam initiated at 8:20 AM.  Appropriate orders placed.  Jeffrey [name] was informed that the remainder of the evaluation will be completed by another provider, this initial triage assessment does not replace that evaluation, and the importance of remaining in the ED until their evaluation is complete.     Ezequiel Essex, MD 12/20/22 [phone]

## 2022-12-20 NOTE — ED Triage Notes (Addendum)
Pt needs dialysis  does not have a free standing facility., last dialysis was Saturday.  Pt on 3L oxygen all the time. Due to CHF and SOB

## 2022-12-20 NOTE — ED Notes (Signed)
Hemodialysis procedure consent form reviewed and signed by pt with this RN as a witness.

## 2022-12-20 NOTE — Plan of Care (Addendum)
Informed of ESRD patient in ER. Presented for HD. Does not have an outpatient dialysis unit anymore given safety issues. Will arrange for HD for today. Will likely be discharged post HD.  Most recent orders from [facility] (pt released from [facility] on 11/11/22)  3h 82min   129kg  Hep 2000  P4   RUA AVG  Please call with any questions/concerns. Patient seen in ER.  Gean Quint, MD [facility]

## 2022-12-21 LAB — HEPATITIS B SURFACE ANTIBODY, QUANTITATIVE: Hep B S AB Quant (Post): 27.5 m[IU]/mL (ref >9.9)

## 2022-12-22 ENCOUNTER — Emergency Department (HOSPITAL_COMMUNITY)
Admission: EM | Admit: 2022-12-22 | Discharge: 2022-12-23 | Discharge status: Against Medical Advice | Payer: 59 | Attending: Emergency Medicine | Admitting: Emergency Medicine

## 2022-12-22 DIAGNOSIS — N186 End stage renal disease: Secondary | ICD-10-CM | POA: Diagnosis not present

## 2022-12-22 DIAGNOSIS — Z7982 Long term (current) use of aspirin: Secondary | ICD-10-CM | POA: Insufficient documentation

## 2022-12-22 DIAGNOSIS — Z79899 Other long term (current) drug therapy: Secondary | ICD-10-CM | POA: Insufficient documentation

## 2022-12-22 DIAGNOSIS — R600 Localized edema: Secondary | ICD-10-CM | POA: Diagnosis not present

## 2022-12-22 DIAGNOSIS — Z794 Long term (current) use of insulin: Secondary | ICD-10-CM | POA: Diagnosis not present

## 2022-12-22 DIAGNOSIS — J449 Chronic obstructive pulmonary disease, unspecified: Secondary | ICD-10-CM | POA: Insufficient documentation

## 2022-12-22 DIAGNOSIS — Z992 Dependence on renal dialysis: Secondary | ICD-10-CM | POA: Insufficient documentation

## 2022-12-22 DIAGNOSIS — R601 Generalized edema: Secondary | ICD-10-CM | POA: Diagnosis not present

## 2022-12-22 DIAGNOSIS — N049 Nephrotic syndrome with unspecified morphologic changes: Secondary | ICD-10-CM

## 2022-12-22 DIAGNOSIS — Z7902 Long term (current) use of antithrombotics/antiplatelets: Secondary | ICD-10-CM | POA: Insufficient documentation

## 2022-12-22 DIAGNOSIS — R5383 Other fatigue: Secondary | ICD-10-CM | POA: Diagnosis present

## 2022-12-22 LAB — CBC WITH DIFFERENTIAL/PLATELET
Abs Immature Granulocytes: 0.05 10*3/uL (ref 0.00–0.07)
Basophils Absolute: 0 10*3/uL (ref 0.0–0.1)
Basophils Relative: 0 %
Eosinophils Absolute: 0.2 10*3/uL (ref 0.0–0.5)
Eosinophils Relative: 3 %
HCT: 27.6 % — ABNORMAL LOW (ref 39.0–52.0)
Hemoglobin: 8.9 g/dL — ABNORMAL LOW (ref 13.0–17.0)
Immature Granulocytes: 1 %
Lymphocytes Relative: 9 %
Lymphs Abs: 0.6 10*3/uL — ABNORMAL LOW (ref 0.7–4.0)
MCH: 28.6 pg (ref 26.0–34.0)
MCHC: 32.2 g/dL (ref 30.0–36.0)
MCV: 88.7 fL (ref 80.0–100.0)
Monocytes Absolute: 0.5 10*3/uL (ref 0.1–1.0)
Monocytes Relative: 8 %
Neutro Abs: 5.3 10*3/uL (ref 1.7–7.7)
Neutrophils Relative %: 79 %
Platelets: 100 10*3/uL — ABNORMAL LOW (ref 150–400)
RBC: 3.11 MIL/uL — ABNORMAL LOW (ref 4.22–5.81)
RDW: 14.2 % (ref 11.5–15.5)
WBC: 6.6 10*3/uL (ref 4.0–10.5)
nRBC: 0 % (ref 0.0–0.2)

## 2022-12-22 LAB — RENAL FUNCTION PANEL
Albumin: 3.4 g/dL — ABNORMAL LOW (ref 3.5–5.0)
Anion gap: 10 (ref 5–15)
BUN: 88 mg/dL — ABNORMAL HIGH (ref 8–23)
CO2: 28 mmol/L (ref 22–32)
Calcium: 8.2 mg/dL — ABNORMAL LOW (ref 8.9–10.3)
Chloride: 99 mmol/L (ref 98–111)
Creatinine, Ser: 4.13 mg/dL — ABNORMAL HIGH (ref 0.61–1.24)
GFR, Estimated: 16 mL/min — ABNORMAL LOW (ref >60)
Glucose, Bld: 158 mg/dL — ABNORMAL HIGH (ref 70–99)
Phosphorus: 4.5 mg/dL (ref 2.5–4.6)
Potassium: 4 mmol/L (ref 3.5–5.1)
Sodium: 137 mmol/L (ref 135–145)

## 2022-12-22 MED ORDER — HEPARIN SODIUM (PORCINE) 1000 UNIT/ML DIALYSIS
2000.0000 [IU] | Freq: Once | INTRAMUSCULAR | Status: AC
  Administered 2022-12-22: 2000 [IU] via INTRAVENOUS_CENTRAL
  Filled 2022-12-22: qty 2

## 2022-12-22 MED ORDER — CHLORHEXIDINE GLUCONATE CLOTH 2 % EX PADS: 6.0000 | MEDICATED_PAD | Freq: Every day | CUTANEOUS | Status: DC
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
## 2022-12-22 NOTE — ED Provider Notes (Signed)
[facility] Provider Note   CSN: [id number] Arrival date & time: 12/22/22  4332     History  Chief Complaint  Patient presents with   Vascular Access Problem    Jeffrey [name] is a 62 y.o. male.  HPI Patient presents 2 days after receiving, evaluated, dialyzed now with concern for dyspnea, edema.  History is notable for multiple medical problems, discharged from prior dialysis center, and his recent enrollment here for this service. He notes that since discharge 2 days ago he has felt more fatigued with mild exertion, has no other complaint such as fever, vomiting, fall.     Home Medications Prior to Admission medications   Medication Sig Start Date End Date Taking? Authorizing Provider  amLODipine (NORVASC) 10 MG tablet Take 10 mg by mouth every morning. 04/17/20   [provider]  ASPIRIN LOW DOSE 81 MG EC tablet Take 81 mg by mouth in the morning. 02/22/21   [provider]  atorvastatin (LIPITOR) 40 MG tablet Take 40 mg by mouth in the morning. 02/09/21   [provider]  calcium acetate (PHOSLO) 667 MG capsule Take 2 capsules (1,334 mg total) by mouth with breakfast, with lunch, and with evening meal. 04/10/21   Debbe Odea, MD  carvedilol (COREG) 12.5 MG tablet Take 12.5 mg by mouth 2 (two) times daily with a meal. 04/17/20   [provider]  Cholecalciferol (VITAMIN D) 50 MCG (2000 UT) CAPS Take 2,000 Units by mouth daily.    [provider]  clopidogrel (PLAVIX) 75 MG tablet Take 1 tablet (75 mg total) by mouth daily. 11/02/22   Rhyne, Hulen Shouts, PA-C  diazepam (VALIUM) 5 MG tablet Take 5 mg by mouth 2 (two) times daily as needed for muscle spasms. 12/09/21   [provider]  diphenhydrAMINE (BENADRYL) 25 MG tablet Take 50 mg by mouth every 6 (six) hours as needed for itching.    [provider]  insulin regular human CONCENTRATED (HUMULIN R U-500 KWIKPEN) 500 UNIT/ML  KwikPen Inject 45 Units into the skin daily with supper. Patient taking differently: Inject 140 Units into the skin 2 (two) times daily with a meal. 09/04/21 08/30/24  [name], MD  metolazone (ZAROXOLYN) 10 MG tablet Take 10 mg by mouth daily. 07/13/21   [provider]  nortriptyline (PAMELOR) 10 MG capsule Take 20 mg by mouth at bedtime. 10/23/19 08/30/24  [provider]  oxymetazoline (AFRIN) 0.05 % nasal spray Place 1 spray into both nostrils daily as needed (nose bleeds).    [provider]  pantoprazole (PROTONIX) 40 MG tablet Take 40 mg by mouth daily before breakfast. 02/09/21   [provider]  pregabalin (LYRICA) 75 MG capsule Take 75 mg by mouth See admin instructions. Daily.  Give after dialysis on dialysis days. 08/22/22   [provider]  sertraline (ZOLOFT) 100 MG tablet Take 100 mg by mouth in the morning. 02/22/21   [provider]  sodium chloride (OCEAN) 0.65 % SOLN nasal spray Place 2 sprays into both nostrils in the morning and at bedtime.    [provider]  tadalafil (CIALIS) 20 MG tablet Take 20 mg by mouth daily as needed for erectile dysfunction.    [provider]  tamsulosin (FLOMAX) 0.4 MG CAPS capsule Take 1 capsule (0.4 mg total) by mouth daily. 11/10/21   Darliss Cheney, MD  torsemide (DEMADEX) 20 MG tablet Take 100 mg by mouth 2 (two) times  daily.    [provider]  traZODone (DESYREL) 150 MG tablet Take 150 mg by mouth at bedtime. 12/09/21   [provider]      Allergies    Actos [pioglitazone], Dexmedetomidine, Ibuprofen, Tomato, and Wellbutrin [bupropion]    Review of Systems   Review of Systems  All other systems reviewed and are negative.   Physical Exam Updated Vital Signs BP (!) 138/50 (BP Location: Left Arm)   Pulse 90   Temp 98.6 F (37 C) (Oral)   Resp 20   Ht 5\' 5"  (1.651 m)   Wt 129.3 kg   SpO2 100%   BMI 47.43 kg/m  Physical Exam Vitals and  nursing note reviewed.  Constitutional:      General: He is not in acute distress.    Appearance: He is well-developed. He is not diaphoretic.  HENT:     Head: Normocephalic and atraumatic.  Cardiovascular:     Rate and Rhythm: Normal rate and regular rhythm.     Heart sounds: Normal heart sounds.  Pulmonary:     Effort: Pulmonary effort is normal.     Comments: Rales in bases Abdominal:     Palpations: Abdomen is soft.     Tenderness: There is no abdominal tenderness.  Musculoskeletal:     Cervical back: Edema present.     Right lower leg: Edema present.     Comments: Left BKA  Skin:    General: Skin is warm and dry.     Findings: No erythema or rash.  Neurological:     Mental Status: He is alert and oriented to person, place, and time.  Psychiatric:        Behavior: Behavior normal.     ED Results / Procedures / Treatments   Labs (all labs ordered are listed, but only abnormal results are displayed) Labs Reviewed  CBC WITH DIFFERENTIAL/PLATELET  BASIC METABOLIC PANEL    EKG None  Radiology No results found.  Procedures Procedures    Medications Ordered in ED Medications - No data to display  ED Course/ Medical Decision Making/ A&P  This patient with a Hx of multiple medical issues including COPD, end-stage renal disease, now presents with dyspnea, edema, this involves an extensive number of treatment options, and is a complaint that carries with it a high risk of complications and morbidity.    The differential diagnosis includes fluid overload secondary to renal failure, less likely bacteremia, sepsis, pneumonia or other acute phenomena given the reassuring initial physical exam   Social Determinants of Health:  Age, prior amputation, dialysis  Additional history obtained:  Additional history and/or information obtained from chart review, notable for ongoing efforts to establish dialysis care here   After the initial evaluation, orders, including:  Labs were initiated.   Consultations Obtained:  I requested consultation with the our nephrology team,  and discussed lab and imaging findings as well as pertinent plan - they recommend: Dialysis  Dispostion / Final MDM:  After consideration of the diagnostic results and the patient's response to treatment, this adult male with multiple medical problems, most notably recent discharge from his dialysis center presents with fluid overload status.  Patient at baseline has difficulty breathing, and this is consistent with today's presentation.  No fever, no other evidence for hemodynamic instability, bacteremia, sepsis.  Patient appropriate for dialysis here.   Final Clinical Impression(s) / ED Diagnoses Final diagnoses:  Anasarca associated with disorder of kidney     Carmin Muskrat, MD 12/22/22  1021  

## 2022-12-22 NOTE — ED Triage Notes (Addendum)
Needs dialysis. Last treatment was on Tuesday.Im not getting my complete treatment. On Tuesday I only got one hour of treatment. Now Im on over load.

## 2022-12-22 NOTE — ED Notes (Signed)
[place] paged per Dr Vanita Panda verbal order

## 2022-12-22 NOTE — ED Notes (Signed)
Patient transported to dialysis with stretcher for self transfer upon arrival to dialysis unit. Spoke with Herbert Spires, dialysis RN.

## 2022-12-22 NOTE — ED Triage Notes (Signed)
Pt was stuck 4 times. Wasn't successful. Normally not a hard stick. Try again in the back.

## 2022-12-22 NOTE — ED Notes (Signed)
Patient called for vitals 

## 2022-12-22 NOTE — ED Provider Notes (Signed)
4:31 PM On return to the ED the patient was awake, alert, in no distress.  He requested expedited discharge to get a ride.   Carmin Muskrat, MD 12/22/22 [phone]

## 2022-12-22 NOTE — Progress Notes (Signed)
   12/22/22 1623  Vitals  Temp 97.9 F (36.6 C)  Temp Source Oral  BP 106/79  MAP (mmHg) 89  BP Location Left Arm  BP Method Automatic  Patient Position (if appropriate) Lying  Pulse Rate 76  Pulse Rate Source Monitor  ECG Heart Rate 76  Resp 16  Oxygen Therapy  SpO2 100 %  O2 Device Nasal Cannula  O2 Flow Rate (L/min) 3 L/min  Pulse Oximetry Type Continuous   Received patient in bed to unit.  Alert and oriented.  Informed consent signed and in chart.   Treatment initiated: 1418 Treatment completed: 1600  Patient tolerated well.  Transported back to the room. Pt signed off AMA after 1hr and 85min of tx d/t having to be back down and discharged from ED to catch his ride service that quits at [place], without acute distress.  Hand-off given to patient's nurse.   Access used: AVG Access issues: NA  Total UF removed: 1581ml Medication(s) given: Heparin 2000 units bolus Post HD VS: see above Post HD weight: UTA d/t pt being on ED stretcher and unable to stand to weigh   [facility]

## 2022-12-27 ENCOUNTER — Other Ambulatory Visit: Payer: Self-pay

## 2022-12-27 ENCOUNTER — Emergency Department (HOSPITAL_COMMUNITY)
Admission: EM | Admit: 2022-12-27 | Discharge: 2022-12-27 | Disposition: A | Discharge status: Home / Self Care | Payer: 59 | Source: Home / Self Care | Attending: Emergency Medicine | Admitting: Emergency Medicine

## 2022-12-27 DIAGNOSIS — R0600 Dyspnea, unspecified: Secondary | ICD-10-CM | POA: Insufficient documentation

## 2022-12-27 DIAGNOSIS — R600 Localized edema: Secondary | ICD-10-CM | POA: Insufficient documentation

## 2022-12-27 DIAGNOSIS — E8770 Fluid overload, unspecified: Secondary | ICD-10-CM | POA: Insufficient documentation

## 2022-12-27 DIAGNOSIS — Z89522 Acquired absence of left knee: Secondary | ICD-10-CM | POA: Insufficient documentation

## 2022-12-27 DIAGNOSIS — Z992 Dependence on renal dialysis: Secondary | ICD-10-CM | POA: Insufficient documentation

## 2022-12-27 DIAGNOSIS — Z7982 Long term (current) use of aspirin: Secondary | ICD-10-CM | POA: Insufficient documentation

## 2022-12-27 LAB — CBC
HCT: 28.9 % — ABNORMAL LOW (ref 39.0–52.0)
Hemoglobin: 8.7 g/dL — ABNORMAL LOW (ref 13.0–17.0)
MCH: 27.9 pg (ref 26.0–34.0)
MCHC: 30.1 g/dL (ref 30.0–36.0)
MCV: 92.6 fL (ref 80.0–100.0)
Platelets: 97 10*3/uL — ABNORMAL LOW (ref 150–400)
RBC: 3.12 MIL/uL — ABNORMAL LOW (ref 4.22–5.81)
RDW: 14.7 % (ref 11.5–15.5)
WBC: 6.6 10*3/uL (ref 4.0–10.5)
nRBC: 0 % (ref 0.0–0.2)

## 2022-12-27 LAB — BASIC METABOLIC PANEL
Anion gap: 13 (ref 5–15)
BUN: 76 mg/dL — ABNORMAL HIGH (ref 8–23)
CO2: 28 mmol/L (ref 22–32)
Calcium: 7.8 mg/dL — ABNORMAL LOW (ref 8.9–10.3)
Chloride: 100 mmol/L (ref 98–111)
Creatinine, Ser: 4.14 mg/dL — ABNORMAL HIGH (ref 0.61–1.24)
GFR, Estimated: 15 mL/min — ABNORMAL LOW (ref >60)
Glucose, Bld: 169 mg/dL — ABNORMAL HIGH (ref 70–99)
Potassium: 3.7 mmol/L (ref 3.5–5.1)
Sodium: 141 mmol/L (ref 135–145)

## 2022-12-27 MED ORDER — LIDOCAINE HCL (PF) 1 % IJ SOLN: 5.0000 mL | INTRAMUSCULAR | Status: DC | PRN

## 2022-12-27 MED ORDER — DARBEPOETIN ALFA 100 MCG/0.5ML IJ SOSY
100.0000 ug | PREFILLED_SYRINGE | Freq: Once | INTRAMUSCULAR | Status: DC
  Filled 2022-12-27: qty 0.5

## 2022-12-27 MED ORDER — PENTAFLUOROPROP-TETRAFLUOROETH EX AERO: 1.0000 | INHALATION_SPRAY | CUTANEOUS | Status: DC | PRN

## 2022-12-27 MED ORDER — CHLORHEXIDINE GLUCONATE CLOTH 2 % EX PADS: 6.0000 | MEDICATED_PAD | Freq: Every day | CUTANEOUS | Status: DC

## 2022-12-27 MED ORDER — LIDOCAINE-PRILOCAINE 2.5-2.5 % EX CREA: 1.0000 | TOPICAL_CREAM | CUTANEOUS | Status: DC | PRN

## 2022-12-27 NOTE — ED Triage Notes (Signed)
Pt requesting dialysis. States needs to find new dialysis center. T, Th, S patient and missed Saturday's tx.

## 2022-12-27 NOTE — ED Notes (Signed)
Report given to Dialysis staff.  Patient transported at this time.  He has his wheelchair, phone, keys, headseat and clothing.

## 2022-12-27 NOTE — Progress Notes (Signed)
Received patient in bed to unit.  Alert and oriented.  Informed consent signed and in chart.   Treatment initiated: 13:49 Treatment completed: 16:18  Patient tolerated well.  Transported back to the room  Alert, without acute distress.  Hand-off given to patient's nurse.   Access used: right AVG Access issues: none  Total UF removed: 1.5L Medication(s) given: none    12/27/22 1618  Vitals  Temp 98 F (36.7 C)  Temp Source Oral  BP 136/80  BP Location Left Arm  BP Method Automatic  Patient Position (if appropriate) Lying  Pulse Rate 80  Pulse Rate Source Monitor  ECG Heart Rate 79  Resp 14  Oxygen Therapy  SpO2 100 %  O2 Device Nasal Cannula  O2 Flow Rate (L/min) 3 L/min  Patient Activity (if Appropriate) In bed  During Treatment Monitoring  HD Safety Checks Performed Yes  Intra-Hemodialysis Comments Tx completed;Tolerated well  Post Treatment  Dialyzer Clearance Lightly streaked  Duration of HD Treatment -hour(s) 2.12 hour(s)  Liters Processed 39.5  Fluid Removed (mL) 1500 mL  Tolerated HD Treatment Yes  Post-Hemodialysis Comments pt terminated tx early said that he needs to be gone so that he doesnt miss his ride. Stephania Fragmin, PA notified  AVG/AVF Arterial Site Held (minutes) 5 minutes  AVG/AVF Venous Site Held (minutes) 5 minutes  Note  Observations pt alert,stable  Fistula / Graft Right Upper arm Arteriovenous vein graft  Placement Date: 10/31/22   Placed prior to admission: Yes  Orientation: Right  Access Location: Upper arm  Access Type: Arteriovenous vein graft  Site Condition No complications  Fistula / Graft Assessment Present;Thrill;Bruit  Status Deaccessed  Needle Size 15g  Drainage Description None      Callia Swim S [facility]

## 2022-12-27 NOTE — ED Provider Notes (Signed)
[facility] Provider Note   CSN: [id number] Arrival date & time: 12/27/22  7026     History  No chief complaint on file.   Jeffrey [name] is a 62 y.o. male.  37 yoM with a chief complaints of needing dialysis.  The patient typically gets dialysis Tuesday Thursday and Saturday.  He unfortunately had been let go from his dialysis center and has been requiring dialysis in the hospital setting.  He was last seen on Thursday.  Tells me that he missed his Saturday session.  He is feeling a bit more swollen than normal.  Is having some mild difficulty breathing.  Tells me that he supposed to be on 2 L at all times and is increased to 3 and still feels like he sometimes having trouble pulling air in.  He denies cough congestion or fever.        Home Medications Prior to Admission medications   Medication Sig Start Date End Date Taking? Authorizing Provider  amLODipine (NORVASC) 10 MG tablet Take 10 mg by mouth every morning. 04/17/20   [provider]  ASPIRIN LOW DOSE 81 MG EC tablet Take 81 mg by mouth in the morning. 02/22/21   [provider]  atorvastatin (LIPITOR) 40 MG tablet Take 40 mg by mouth in the morning. 02/09/21   [provider]  calcium acetate (PHOSLO) 667 MG capsule Take 2 capsules (1,334 mg total) by mouth with breakfast, with lunch, and with evening meal. 04/10/21   Debbe Odea, MD  carvedilol (COREG) 12.5 MG tablet Take 12.5 mg by mouth 2 (two) times daily with a meal. 04/17/20   [provider]  Cholecalciferol (VITAMIN D) 50 MCG (2000 UT) CAPS Take 2,000 Units by mouth daily.    [provider]  clopidogrel (PLAVIX) 75 MG tablet Take 1 tablet (75 mg total) by mouth daily. 11/02/22   Rhyne, Hulen Shouts, PA-C  diazepam (VALIUM) 5 MG tablet Take 5 mg by mouth 2 (two) times daily as needed for muscle spasms. 12/09/21   [provider]  diphenhydrAMINE (BENADRYL) 25 MG tablet Take  50 mg by mouth every 6 (six) hours as needed for itching.    [provider]  insulin regular human CONCENTRATED (HUMULIN R U-500 KWIKPEN) 500 UNIT/ML KwikPen Inject 45 Units into the skin daily with supper. Patient taking differently: Inject 140 Units into the skin 2 (two) times daily with a meal. 09/04/21 08/30/24  [name], MD  metolazone (ZAROXOLYN) 10 MG tablet Take 10 mg by mouth daily. 07/13/21   [provider]  nortriptyline (PAMELOR) 10 MG capsule Take 20 mg by mouth at bedtime. 10/23/19 08/30/24  [provider]  oxymetazoline (AFRIN) 0.05 % nasal spray Place 1 spray into both nostrils daily as needed (nose bleeds).    [provider]  pantoprazole (PROTONIX) 40 MG tablet Take 40 mg by mouth daily before breakfast. 02/09/21   [provider]  pregabalin (LYRICA) 75 MG capsule Take 75 mg by mouth See admin instructions. Daily.  Give after dialysis on dialysis days. 08/22/22   [provider]  sertraline (ZOLOFT) 100 MG tablet Take 100 mg by mouth in the morning. 02/22/21   [provider]  sodium chloride (OCEAN) 0.65 % SOLN nasal spray Place 2 sprays into both nostrils in the morning and at bedtime.    [provider]  tadalafil (CIALIS) 20 MG tablet Take 20 mg by mouth daily as needed for  erectile dysfunction.    [provider]  tamsulosin (FLOMAX) 0.4 MG CAPS capsule Take 1 capsule (0.4 mg total) by mouth daily. 11/10/21   Darliss Cheney, MD  torsemide (DEMADEX) 20 MG tablet Take 100 mg by mouth 2 (two) times daily.    [provider]  traZODone (DESYREL) 150 MG tablet Take 150 mg by mouth at bedtime. 12/09/21   [provider]      Allergies    Actos [pioglitazone], Dexmedetomidine, Ibuprofen, Tomato, and Wellbutrin [bupropion]    Review of Systems   Review of Systems  Physical Exam Updated Vital Signs BP (!) 102/51 (BP Location: Left Arm)   Pulse 77   Temp 98.2 F (36.8 C)  (Oral)   Resp 13   SpO2 98%  Physical Exam Vitals and nursing note reviewed.  Constitutional:      Appearance: He is well-developed.  HENT:     Head: Normocephalic and atraumatic.  Eyes:     Pupils: Pupils are equal, round, and reactive to light.  Neck:     Vascular: No JVD.  Cardiovascular:     Rate and Rhythm: Normal rate and regular rhythm.     Heart sounds: No murmur heard.    No friction rub. No gallop.  Pulmonary:     Effort: No respiratory distress.     Breath sounds: Rales present. No wheezing.     Comments: Rales up to midlung bilaterally Abdominal:     General: There is no distension.     Tenderness: There is no abdominal tenderness. There is no guarding or rebound.  Musculoskeletal:        General: Normal range of motion.     Cervical back: Normal range of motion and neck supple.     Comments: Left lower extremity amputation.  Right lower extremity with 4+ edema up to the thigh  Skin:    Coloration: Skin is not pale.     Findings: No rash.  Neurological:     Mental Status: He is alert and oriented to person, place, and time.  Psychiatric:        Behavior: Behavior normal.     ED Results / Procedures / Treatments   Labs (all labs ordered are listed, but only abnormal results are displayed) Labs Reviewed  CBC - Abnormal; Notable for the following components:      Result Value   RBC 3.12 (*)    Hemoglobin 8.7 (*)    HCT 28.9 (*)    Platelets 97 (*)    All other components within normal limits  BASIC METABOLIC PANEL - Abnormal; Notable for the following components:   Glucose, Bld 169 (*)    BUN 76 (*)    Creatinine, Ser 4.14 (*)    Calcium 7.8 (*)    GFR, Estimated 15 (*)    All other components within normal limits    EKG EKG Interpretation  Date/Time:  Tuesday December 27 2022 12:37:53 EST Ventricular Rate:  85 PR Interval:  154 QRS Duration: 86 QT Interval:  394 QTC Calculation: 468 R Axis:   88 Text Interpretation: Normal sinus rhythm  Cannot rule out Anterior infarct , age undetermined Abnormal ECG When compared with ECG of 20-Dec-2022 08:27, No significant change since last tracing Confirmed by Aletta Edouard [phone]) on 12/27/2022 12:40:04 PM  Radiology No results found.  Procedures .Critical Care  Performed by: Deno Etienne, DO Authorized by: Deno Etienne, DO   Critical care provider statement:    Critical care time (  minutes):  35   Critical care time was exclusive of:  Separately billable procedures and treating other patients   Critical care was time spent personally by me on the following activities:  Development of treatment plan with patient or surrogate, discussions with consultants, evaluation of patient's response to treatment, examination of patient, ordering and review of laboratory studies, ordering and review of radiographic studies, ordering and performing treatments and interventions, pulse oximetry, re-evaluation of patient's condition and review of old charts   Care discussed with: admitting provider       Medications Ordered in ED Medications  Chlorhexidine Gluconate Cloth 2 % PADS 6 each (has no administration in time range)  pentafluoroprop-tetrafluoroeth (GEBAUERS) aerosol 1 Application (has no administration in time range)  lidocaine (PF) (XYLOCAINE) 1 % injection 5 mL (has no administration in time range)  lidocaine-prilocaine (EMLA) cream 1 Application (has no administration in time range)  Darbepoetin Alfa (ARANESP) injection 100 mcg (has no administration in time range)    ED Course/ Medical Decision Making/ A&P Clinical Course as of 12/27/22 1620  Tue Dec 27, 2022  1614 Received sign out from Dr. Tyrone Nine pending HD and re-assessment [WS]    Clinical Course User Index [WS] Cristie Hem, MD                             Medical Decision Making Amount and/or Complexity of Data Reviewed Labs: ordered. ECG/medicine tests: ordered.   62 yo M with a chief complaints of needing  dialysis.  The patient unfortunately has been let go from his previous dialysis center and has been requiring dialysis in the hospital setting.  He was last dialyzed on Thursday.  Normally gets dialysis Tuesday Thursday and Saturday.  He does feel like he is a bit short of breath.  Not in any acute distress on my exam.  Does appear clinically fluid overloaded.  No hyperkalemia, BUN is 76.  Hemoglobin has been on a slow downtrend over the past couple weeks was 10 a couple weeks ago and is trended down to 8.7.  Denies any specific bleeding..  Will discuss with nephrology.  I discussed case with Dr. Carolin Sicks, plan to dialyze here.   The patients results and plan were reviewed and discussed.   Any x-rays performed were independently reviewed by myself.   Differential diagnosis were considered with the presenting HPI.  Medications  Chlorhexidine Gluconate Cloth 2 % PADS 6 each (has no administration in time range)  pentafluoroprop-tetrafluoroeth (GEBAUERS) aerosol 1 Application (has no administration in time range)  lidocaine (PF) (XYLOCAINE) 1 % injection 5 mL (has no administration in time range)  lidocaine-prilocaine (EMLA) cream 1 Application (has no administration in time range)  Darbepoetin Alfa (ARANESP) injection 100 mcg (has no administration in time range)    Vitals:   12/27/22 1442 12/27/22 1500 12/27/22 1530 12/27/22 1600  BP: 139/66 (!) 140/61 125/61 (!) 102/51  Pulse:  80 79 77  Resp:  13 13 13   Temp:      TempSrc:      SpO2:  95% 98% 98%    Final diagnoses:  Generalized edema due to fluid overload          Final Clinical Impression(s) / ED Diagnoses Final diagnoses:  Generalized edema due to fluid overload    Rx / DC Orders ED Discharge Orders     None         Deno Etienne, DO 12/27/22 1620

## 2022-12-27 NOTE — ED Provider Notes (Signed)
    ED Course / MDM   Clinical Course as of 12/27/22 1710  Tue Dec 27, 2022  1614 Received sign out from Dr. Tyrone Nine pending HD and re-assessment [WS]  1709 has returned from hemodialysis.  Blood pressure improved.  Not hypoxic on home 3 L of oxygen.  Dialysis completed uneventfully.  Patient requesting discharge. Will discharge patient to home. All questions answered. Patient comfortable with plan of discharge. Return precautions discussed with patient and specified on the after visit summary. [WS]    Clinical Course User Index [WS] Cristie Hem, MD   Medical Decision Making Amount and/or Complexity of Data Reviewed Labs: ordered. ECG/medicine tests: ordered.        Cristie Hem, MD 12/27/22 1710

## 2022-12-27 NOTE — Progress Notes (Signed)
  [facility] Brief Consult Note   Subjective:   Mr. Surgeon is currently without an outpatient HD unit since 10/2022. Presented to ED today in need of dialysis. Last HD here was 12/22/22. Labs checked - stable. BP fine. He does have edema on exam.   Objective Vitals:   12/27/22 1330 12/27/22 1349 12/27/22 1400 12/27/22 1430  BP: 125/61 (!) 129/59 130/60   Pulse: 84 81 82 81  Resp: 14 15 14 13   Temp: 98.2 F (36.8 C)     TempSrc: Oral     SpO2: 97% 98% 96% 95%   Physical Exam General: Chronically ill appearing man, nasal O2 in place Heart: RRR; no murmur Lungs: CTA anteriorly Abdomen: soft Extremities: L AKA, RLE 1+ edema Dialysis Access: RUE AVF + bruit  Additional Objective Labs: Basic Metabolic Panel: Recent Labs  Lab 12/20/22 1541 12/22/22 1413 12/27/22 0838  NA 139 137 141  K 4.0 4.0 3.7  CL 97* 99 100  CO2 27 28 28   GLUCOSE 78 158* 169*  BUN 102* 88* 76*  CREATININE 4.33* 4.13* 4.14*  CALCIUM 8.5* 8.2* 7.8*  PHOS 5.5* 4.5  --    Liver Function Tests: Recent Labs  Lab 12/20/22 1541 12/22/22 1413  ALBUMIN 3.5 3.4*   CBC: Recent Labs  Lab 12/22/22 1400 12/27/22 0838  WBC 6.6 6.6  NEUTROABS 5.3  --   HGB 8.9* 8.7*  HCT 27.6* 28.9*  MCV 88.7 92.6  PLT 100* 97*   Last Dialysis Orders: 3:15hr, 129kg, RUE AVG, UFP#4, heparin 2000unit bolus  Assessment/Plan: ESRD: He is in need of dialysis -> running now, 3.5hr, 3K, 3L UFG.  HTN/volume: BP stable, he does have some edema on exam. UF as tolerated.  Anemia of ESRD: Will see if ED can give him a dose of Aranesp 151mcg before he leaves.   Veneta Penton, PA-C 12/27/2022, 2:33 PM  [facility]

## 2022-12-27 NOTE — Progress Notes (Signed)
HD orders placed in chart -- will call when we can bring him up and I will see him there.  Labs and VS reviewed.  Veneta Penton, PA-C [employer] Pager [phone]

## 2022-12-29 ENCOUNTER — Other Ambulatory Visit: Payer: Self-pay

## 2022-12-29 ENCOUNTER — Inpatient Hospital Stay (HOSPITAL_COMMUNITY)
Admission: EM | Admit: 2022-12-29 | Discharge: 2022-12-31 | DRG: 640 | Disposition: A | Discharge status: Home / Self Care | Payer: 59 | Attending: Internal Medicine | Admitting: Internal Medicine

## 2022-12-29 ENCOUNTER — Encounter (HOSPITAL_COMMUNITY): Payer: Self-pay

## 2022-12-29 ENCOUNTER — Emergency Department (HOSPITAL_COMMUNITY): Payer: 59

## 2022-12-29 DIAGNOSIS — D631 Anemia in chronic kidney disease: Secondary | ICD-10-CM | POA: Diagnosis present

## 2022-12-29 DIAGNOSIS — Z79899 Other long term (current) drug therapy: Secondary | ICD-10-CM

## 2022-12-29 DIAGNOSIS — Z7902 Long term (current) use of antithrombotics/antiplatelets: Secondary | ICD-10-CM

## 2022-12-29 DIAGNOSIS — K219 Gastro-esophageal reflux disease without esophagitis: Secondary | ICD-10-CM | POA: Diagnosis present

## 2022-12-29 DIAGNOSIS — Z1152 Encounter for screening for COVID-19: Secondary | ICD-10-CM

## 2022-12-29 DIAGNOSIS — I5032 Chronic diastolic (congestive) heart failure: Secondary | ICD-10-CM | POA: Diagnosis not present

## 2022-12-29 DIAGNOSIS — E1165 Type 2 diabetes mellitus with hyperglycemia: Secondary | ICD-10-CM | POA: Diagnosis present

## 2022-12-29 DIAGNOSIS — E785 Hyperlipidemia, unspecified: Secondary | ICD-10-CM

## 2022-12-29 DIAGNOSIS — E1169 Type 2 diabetes mellitus with other specified complication: Secondary | ICD-10-CM | POA: Diagnosis present

## 2022-12-29 DIAGNOSIS — E1151 Type 2 diabetes mellitus with diabetic peripheral angiopathy without gangrene: Secondary | ICD-10-CM | POA: Diagnosis present

## 2022-12-29 DIAGNOSIS — E8779 Other fluid overload: Secondary | ICD-10-CM | POA: Diagnosis not present

## 2022-12-29 DIAGNOSIS — G4733 Obstructive sleep apnea (adult) (pediatric): Secondary | ICD-10-CM

## 2022-12-29 DIAGNOSIS — Z794 Long term (current) use of insulin: Secondary | ICD-10-CM

## 2022-12-29 DIAGNOSIS — Z89612 Acquired absence of left leg above knee: Secondary | ICD-10-CM

## 2022-12-29 DIAGNOSIS — E782 Mixed hyperlipidemia: Secondary | ICD-10-CM | POA: Diagnosis present

## 2022-12-29 DIAGNOSIS — F39 Unspecified mood [affective] disorder: Secondary | ICD-10-CM | POA: Diagnosis present

## 2022-12-29 DIAGNOSIS — F4310 Post-traumatic stress disorder, unspecified: Secondary | ICD-10-CM | POA: Diagnosis present

## 2022-12-29 DIAGNOSIS — Z7982 Long term (current) use of aspirin: Secondary | ICD-10-CM

## 2022-12-29 DIAGNOSIS — G894 Chronic pain syndrome: Secondary | ICD-10-CM | POA: Diagnosis present

## 2022-12-29 DIAGNOSIS — Z87891 Personal history of nicotine dependence: Secondary | ICD-10-CM

## 2022-12-29 DIAGNOSIS — Z992 Dependence on renal dialysis: Secondary | ICD-10-CM

## 2022-12-29 DIAGNOSIS — E8770 Fluid overload, unspecified: Principal | ICD-10-CM | POA: Diagnosis present

## 2022-12-29 DIAGNOSIS — N186 End stage renal disease: Secondary | ICD-10-CM | POA: Diagnosis present

## 2022-12-29 DIAGNOSIS — I132 Hypertensive heart and chronic kidney disease with heart failure and with stage 5 chronic kidney disease, or end stage renal disease: Secondary | ICD-10-CM | POA: Diagnosis present

## 2022-12-29 DIAGNOSIS — Z6841 Body Mass Index (BMI) 40.0 and over, adult: Secondary | ICD-10-CM

## 2022-12-29 DIAGNOSIS — M545 Low back pain, unspecified: Secondary | ICD-10-CM | POA: Diagnosis not present

## 2022-12-29 DIAGNOSIS — D509 Iron deficiency anemia, unspecified: Secondary | ICD-10-CM | POA: Diagnosis present

## 2022-12-29 DIAGNOSIS — J449 Chronic obstructive pulmonary disease, unspecified: Secondary | ICD-10-CM | POA: Diagnosis present

## 2022-12-29 DIAGNOSIS — E1122 Type 2 diabetes mellitus with diabetic chronic kidney disease: Secondary | ICD-10-CM | POA: Diagnosis present

## 2022-12-29 DIAGNOSIS — N400 Enlarged prostate without lower urinary tract symptoms: Secondary | ICD-10-CM | POA: Diagnosis present

## 2022-12-29 DIAGNOSIS — G8929 Other chronic pain: Secondary | ICD-10-CM | POA: Diagnosis present

## 2022-12-29 DIAGNOSIS — E1142 Type 2 diabetes mellitus with diabetic polyneuropathy: Secondary | ICD-10-CM | POA: Diagnosis present

## 2022-12-29 DIAGNOSIS — F330 Major depressive disorder, recurrent, mild: Secondary | ICD-10-CM | POA: Diagnosis present

## 2022-12-29 LAB — COMPREHENSIVE METABOLIC PANEL
ALT: 13 U/L (ref 0–44)
AST: 12 U/L — ABNORMAL LOW (ref 15–41)
Albumin: 3.4 g/dL — ABNORMAL LOW (ref 3.5–5.0)
Alkaline Phosphatase: 75 U/L (ref 38–126)
Anion gap: 12 (ref 5–15)
BUN: 60 mg/dL — ABNORMAL HIGH (ref 8–23)
CO2: 26 mmol/L (ref 22–32)
Calcium: 7.8 mg/dL — ABNORMAL LOW (ref 8.9–10.3)
Chloride: 99 mmol/L (ref 98–111)
Creatinine, Ser: 3.86 mg/dL — ABNORMAL HIGH (ref 0.61–1.24)
GFR, Estimated: 17 mL/min — ABNORMAL LOW (ref >60)
Glucose, Bld: 238 mg/dL — ABNORMAL HIGH (ref 70–99)
Potassium: 3.7 mmol/L (ref 3.5–5.1)
Sodium: 137 mmol/L (ref 135–145)
Total Bilirubin: 0.6 mg/dL (ref 0.3–1.2)
Total Protein: 7 g/dL (ref 6.5–8.1)

## 2022-12-29 LAB — CBC WITH DIFFERENTIAL/PLATELET
Abs Immature Granulocytes: 0.05 10*3/uL (ref 0.00–0.07)
Basophils Absolute: 0 10*3/uL (ref 0.0–0.1)
Basophils Relative: 0 %
Eosinophils Absolute: 0.2 10*3/uL (ref 0.0–0.5)
Eosinophils Relative: 2 %
HCT: 27.5 % — ABNORMAL LOW (ref 39.0–52.0)
Hemoglobin: 8.7 g/dL — ABNORMAL LOW (ref 13.0–17.0)
Immature Granulocytes: 1 %
Lymphocytes Relative: 9 %
Lymphs Abs: 0.6 10*3/uL — ABNORMAL LOW (ref 0.7–4.0)
MCH: 28.6 pg (ref 26.0–34.0)
MCHC: 31.6 g/dL (ref 30.0–36.0)
MCV: 90.5 fL (ref 80.0–100.0)
Monocytes Absolute: 0.4 10*3/uL (ref 0.1–1.0)
Monocytes Relative: 6 %
Neutro Abs: 5.7 10*3/uL (ref 1.7–7.7)
Neutrophils Relative %: 82 %
Platelets: 102 10*3/uL — ABNORMAL LOW (ref 150–400)
RBC: 3.04 MIL/uL — ABNORMAL LOW (ref 4.22–5.81)
RDW: 14.7 % (ref 11.5–15.5)
WBC: 7 10*3/uL (ref 4.0–10.5)
nRBC: 0 % (ref 0.0–0.2)

## 2022-12-29 LAB — CREATININE, SERUM
Creatinine, Ser: 2.85 mg/dL — ABNORMAL HIGH (ref 0.61–1.24)
GFR, Estimated: 24 mL/min — ABNORMAL LOW (ref >60)

## 2022-12-29 LAB — CBG MONITORING, ED: Glucose-Capillary: 312 mg/dL — ABNORMAL HIGH (ref 70–99)

## 2022-12-29 LAB — RESP PANEL BY RT-PCR (RSV, FLU A&B, COVID)  RVPGX2
Influenza A by PCR: NEGATIVE
Influenza B by PCR: NEGATIVE
Resp Syncytial Virus by PCR: NEGATIVE
SARS Coronavirus 2 by RT PCR: NEGATIVE

## 2022-12-29 LAB — TROPONIN I (HIGH SENSITIVITY): Troponin I (High Sensitivity): 13 ng/L (ref <18)

## 2022-12-29 LAB — CBC
HCT: 27.8 % — ABNORMAL LOW (ref 39.0–52.0)
Hemoglobin: 8.8 g/dL — ABNORMAL LOW (ref 13.0–17.0)
MCH: 28.8 pg (ref 26.0–34.0)
MCHC: 31.7 g/dL (ref 30.0–36.0)
MCV: 90.8 fL (ref 80.0–100.0)
Platelets: 85 10*3/uL — ABNORMAL LOW (ref 150–400)
RBC: 3.06 MIL/uL — ABNORMAL LOW (ref 4.22–5.81)
RDW: 15.1 % (ref 11.5–15.5)
WBC: 6.1 10*3/uL (ref 4.0–10.5)
nRBC: 0 % (ref 0.0–0.2)

## 2022-12-29 LAB — GLUCOSE, CAPILLARY: Glucose-Capillary: 330 mg/dL — ABNORMAL HIGH (ref 70–99)

## 2022-12-29 MED ORDER — LIDOCAINE HCL (PF) 1 % IJ SOLN: 5.0000 mL | INTRAMUSCULAR | Status: DC | PRN

## 2022-12-29 MED ORDER — TRAZODONE HCL 50 MG PO TABS
150.0000 mg | ORAL_TABLET | Freq: Every day | ORAL | Status: DC
  Administered 2022-12-30: 150 mg via ORAL
  Filled 2022-12-29: qty 3

## 2022-12-29 MED ORDER — INSULIN ASPART 100 UNIT/ML IJ SOLN
0.0000 [IU] | Freq: Three times a day (TID) | INTRAMUSCULAR | Status: DC
  Administered 2022-12-29: 15 [IU] via SUBCUTANEOUS
  Administered 2022-12-30: 11 [IU] via SUBCUTANEOUS
  Administered 2022-12-30: 7 [IU] via SUBCUTANEOUS
  Administered 2022-12-31: 11 [IU] via SUBCUTANEOUS
  Administered 2022-12-31: 4 [IU] via SUBCUTANEOUS
  Administered 2022-12-31: 11 [IU] via SUBCUTANEOUS

## 2022-12-29 MED ORDER — PENTAFLUOROPROP-TETRAFLUOROETH EX AERO: 1.0000 | INHALATION_SPRAY | CUTANEOUS | Status: DC | PRN

## 2022-12-29 MED ORDER — CARVEDILOL 12.5 MG PO TABS
12.5000 mg | ORAL_TABLET | Freq: Two times a day (BID) | ORAL | Status: DC
  Administered 2022-12-29 – 2022-12-31 (×4): 12.5 mg via ORAL
  Filled 2022-12-29 (×4): qty 1

## 2022-12-29 MED ORDER — INSULIN GLARGINE-YFGN 100 UNIT/ML ~~LOC~~ SOLN
60.0000 [IU] | Freq: Two times a day (BID) | SUBCUTANEOUS | Status: DC
  Administered 2022-12-29 – 2022-12-31 (×3): 60 [IU] via SUBCUTANEOUS
  Filled 2022-12-29 (×6): qty 0.6

## 2022-12-29 MED ORDER — HEPARIN SODIUM (PORCINE) 1000 UNIT/ML DIALYSIS: 1000.0000 [IU] | INTRAMUSCULAR | Status: DC | PRN

## 2022-12-29 MED ORDER — METOLAZONE 5 MG PO TABS
10.0000 mg | ORAL_TABLET | Freq: Every day | ORAL | Status: DC
  Administered 2022-12-30 – 2022-12-31 (×2): 10 mg via ORAL
  Filled 2022-12-29 (×2): qty 2

## 2022-12-29 MED ORDER — CLOPIDOGREL BISULFATE 75 MG PO TABS
75.0000 mg | ORAL_TABLET | Freq: Every day | ORAL | Status: DC
  Administered 2022-12-30 – 2022-12-31 (×2): 75 mg via ORAL
  Filled 2022-12-29 (×2): qty 1

## 2022-12-29 MED ORDER — ATORVASTATIN CALCIUM 40 MG PO TABS
40.0000 mg | ORAL_TABLET | Freq: Every morning | ORAL | Status: DC
  Administered 2022-12-30 – 2022-12-31 (×2): 40 mg via ORAL
  Filled 2022-12-29 (×2): qty 1

## 2022-12-29 MED ORDER — PREGABALIN 75 MG PO CAPS
75.0000 mg | ORAL_CAPSULE | ORAL | Status: DC
  Administered 2022-12-31: 75 mg via ORAL
  Filled 2022-12-29: qty 1

## 2022-12-29 MED ORDER — ANTICOAGULANT SODIUM CITRATE 4% (200MG/5ML) IV SOLN: 5.0000 mL | Status: DC | PRN

## 2022-12-29 MED ORDER — ASPIRIN 81 MG PO TBEC
81.0000 mg | DELAYED_RELEASE_TABLET | Freq: Every morning | ORAL | Status: DC
  Administered 2022-12-30 – 2022-12-31 (×2): 81 mg via ORAL
  Filled 2022-12-29 (×2): qty 1

## 2022-12-29 MED ORDER — DIAZEPAM 2 MG PO TABS
5.0000 mg | ORAL_TABLET | Freq: Two times a day (BID) | ORAL | Status: DC | PRN
  Administered 2022-12-29 – 2022-12-30 (×2): 5 mg via ORAL
  Filled 2022-12-29 (×3): qty 3

## 2022-12-29 MED ORDER — PANTOPRAZOLE SODIUM 40 MG PO TBEC
40.0000 mg | DELAYED_RELEASE_TABLET | Freq: Every day | ORAL | Status: DC
  Administered 2022-12-31: 40 mg via ORAL
  Filled 2022-12-29: qty 1

## 2022-12-29 MED ORDER — POLYETHYLENE GLYCOL 3350 17 G PO PACK: 17.0000 g | PACK | Freq: Every day | ORAL | Status: DC | PRN

## 2022-12-29 MED ORDER — TORSEMIDE 20 MG PO TABS
120.0000 mg | ORAL_TABLET | Freq: Two times a day (BID) | ORAL | Status: DC
  Administered 2022-12-30 – 2022-12-31 (×3): 120 mg via ORAL
  Filled 2022-12-29 (×3): qty 6

## 2022-12-29 MED ORDER — CALCIUM ACETATE (PHOS BINDER) 667 MG PO CAPS
1334.0000 mg | ORAL_CAPSULE | Freq: Three times a day (TID) | ORAL | Status: DC
  Administered 2022-12-29 – 2022-12-31 (×5): 1334 mg via ORAL
  Filled 2022-12-29 (×5): qty 2

## 2022-12-29 MED ORDER — HYDROCODONE-ACETAMINOPHEN 5-325 MG PO TABS
1.0000 | ORAL_TABLET | Freq: Once | ORAL | Status: AC
  Administered 2022-12-29: 1 via ORAL
  Filled 2022-12-29: qty 1

## 2022-12-29 MED ORDER — HEPARIN SODIUM (PORCINE) 5000 UNIT/ML IJ SOLN
5000.0000 [IU] | Freq: Three times a day (TID) | INTRAMUSCULAR | Status: DC
  Administered 2022-12-29 – 2022-12-31 (×6): 5000 [IU] via SUBCUTANEOUS
  Filled 2022-12-29 (×6): qty 1

## 2022-12-29 MED ORDER — ACETAMINOPHEN 650 MG RE SUPP: 650.0000 mg | Freq: Four times a day (QID) | RECTAL | Status: DC | PRN

## 2022-12-29 MED ORDER — AMLODIPINE BESYLATE 10 MG PO TABS
10.0000 mg | ORAL_TABLET | Freq: Every morning | ORAL | Status: DC
  Administered 2022-12-30 – 2022-12-31 (×2): 10 mg via ORAL
  Filled 2022-12-29 (×2): qty 1

## 2022-12-29 MED ORDER — ALTEPLASE 2 MG IJ SOLR: 2.0000 mg | Freq: Once | INTRAMUSCULAR | Status: DC | PRN

## 2022-12-29 MED ORDER — ACETAMINOPHEN 325 MG PO TABS
650.0000 mg | ORAL_TABLET | Freq: Four times a day (QID) | ORAL | Status: DC | PRN
  Administered 2022-12-29 – 2022-12-30 (×3): 650 mg via ORAL
  Filled 2022-12-29 (×4): qty 2

## 2022-12-29 MED ORDER — NORTRIPTYLINE HCL 10 MG PO CAPS
20.0000 mg | ORAL_CAPSULE | Freq: Every day | ORAL | Status: DC
  Administered 2022-12-29 – 2022-12-30 (×2): 20 mg via ORAL
  Filled 2022-12-29 (×3): qty 2

## 2022-12-29 MED ORDER — OXYCODONE HCL 5 MG PO TABS
5.0000 mg | ORAL_TABLET | Freq: Once | ORAL | Status: AC
  Administered 2022-12-29: 5 mg via ORAL
  Filled 2022-12-29: qty 1

## 2022-12-29 MED ORDER — FUROSEMIDE 20 MG PO TABS
160.0000 mg | ORAL_TABLET | Freq: Once | ORAL | Status: AC
  Administered 2022-12-29: 160 mg via ORAL
  Filled 2022-12-29: qty 8

## 2022-12-29 MED ORDER — SERTRALINE HCL 100 MG PO TABS
100.0000 mg | ORAL_TABLET | Freq: Every morning | ORAL | Status: DC
  Administered 2022-12-30 – 2022-12-31 (×2): 100 mg via ORAL
  Filled 2022-12-29 (×2): qty 1

## 2022-12-29 MED ORDER — TAMSULOSIN HCL 0.4 MG PO CAPS
0.4000 mg | ORAL_CAPSULE | Freq: Every day | ORAL | Status: DC
  Administered 2022-12-30 – 2022-12-31 (×2): 0.4 mg via ORAL
  Filled 2022-12-29 (×2): qty 1

## 2022-12-29 MED ORDER — LIDOCAINE-PRILOCAINE 2.5-2.5 % EX CREA: 1.0000 | TOPICAL_CREAM | CUTANEOUS | Status: DC | PRN

## 2022-12-29 MED ORDER — SODIUM CHLORIDE 0.9% FLUSH
3.0000 mL | Freq: Two times a day (BID) | INTRAVENOUS | Status: DC
  Administered 2022-12-29 – 2022-12-31 (×4): 3 mL via INTRAVENOUS
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
## 2022-12-29 NOTE — Plan of Care (Signed)
  Problem: Coping: Goal: Ability to adjust to condition or change in health will improve Outcome: Progressing   

## 2022-12-29 NOTE — Progress Notes (Addendum)
  [name] Renal Quick Note   Subjective:  Came in via EMS with SOB, productive cough. Resp panel negative for Flu/COVID. Brought up for HD - 3L UFG and tolerated. Wearing O2. Labs reviewed - trop low/stable. K 3.7. Still urinates.  Objective Vitals:   12/29/22 0923 12/29/22 1008 12/29/22 1020 12/29/22 1050  BP:  (!) 127/49 (!) 103/45 (!) 109/49  Pulse:  95 83 84  Resp:  20 14 14   Temp: 98 F (36.7 C) 98.3 F (36.8 C)    TempSrc:      SpO2:  95% 99% 100%  Weight:      Height:       Physical Exam General: Chronincally ill appearing man, NAD. Nasal O2 in place Heart: RRR Lungs: CTA anteriorly Abdomen: obese, soft Extremities: no RLE edema, L AKA Dialysis Access: RUE AVF + bruit  Additional Objective Labs: Basic Metabolic Panel: Recent Labs  Lab 12/22/22 1413 12/27/22 0838 12/29/22 0501  NA 137 141 137  K 4.0 3.7 3.7  CL 99 100 99  CO2 28 28 26   GLUCOSE 158* 169* 238*  BUN 88* 76* 60*  CREATININE 4.13* 4.14* 3.86*  CALCIUM 8.2* 7.8* 7.8*  PHOS 4.5  --   --    Liver Function Tests: Recent Labs  Lab 12/22/22 1413 12/29/22 0501  AST  --  12*  ALT  --  13  ALKPHOS  --  75  BILITOT  --  0.6  PROT  --  7.0  ALBUMIN 3.4* 3.4*  CBC: Recent Labs  Lab 12/22/22 1400 12/27/22 0838 12/29/22 0501  WBC 6.6 6.6 7.0  NEUTROABS 5.3  --  5.7  HGB 8.9* 8.7* 8.7*  HCT 27.6* 28.9* 27.5*  MCV 88.7 92.6 90.5  PLT 100* 97* 102*   Studies/Results: DG Chest 2 View  Result Date: 12/29/2022 CLINICAL DATA:  Shortness of breath. EXAM: CHEST - 2 VIEW COMPARISON:  12/17/2022. FINDINGS: Heart is enlarged and the mediastinal contour is within normal limits. A vascular stent is noted in the suprahilar region on the right. The pulmonary vasculature is mildly distended. Interstitial prominence is present bilaterally. No consolidation, effusion, or pneumothorax. Right shoulder arthroplasty changes are noted. Posterior cervical spinal fusion hardware is present. No acute  osseous abnormality. IMPRESSION: 1. Cardiomegaly with pulmonary vascular congestion. 2. Interstitial prominence bilaterally, possible edema or infiltrate. Electronically Signed   By: Brett Fairy M.D.   On: 12/29/2022 04:50    Last Dialysis Orders: 3:15hr, 129kg, RUE AVG, UFP#4, heparin 2000unit bolus   Assessment/Plan: ESRD: He is in need of dialysis -> running now, 3.5hr, 3K, 3L UFG.  HTN/volume: BP stable, he does have some edema on CXR. UF as tolerated.  Anemia of ESRD: Aranesp 15mcg given 1/30 - re-dose next week. Productive cough: Flu/COVID negative. No HCAP. Likely fluid.  Veneta Penton, PA-C 12/29/2022, 11:52 AM  [facility]  ADDENDUM 14:51PM: D/w ED PA after going back there after dialysis - he does not feel comfortable discharging home - still feels a little dyspneic. Rec consult medicine for admit. Can give PO Lasix 160mg  today and will arrange another HD tomorrow. [place]

## 2022-12-29 NOTE — Progress Notes (Signed)
Received patient in bed to unit.  Alert and oriented.  Informed consent signed and in chart.   Treatment initiated: 1020 Treatment completed: 1304  Patient tolerated well.  Transported back to the room  Alert, without acute distress.  Hand-off given to patient's nurse.   Access used: AVG Access issues: None  Total UF removed: 2.1L Medication(s) given: None Post HD VS: 121/42,88,98.3,100%,18 Post HD weight: not able to weigh   [name]

## 2022-12-29 NOTE — ED Notes (Signed)
Awaiting for IV team to get IV to draw 2nd troponin Provider aware

## 2022-12-29 NOTE — ED Notes (Signed)
Called again for meal tray dietary reports it will be sent up stat

## 2022-12-29 NOTE — ED Notes (Signed)
ED TO INPATIENT HANDOFF REPORT  ED Nurse Name and Phone #: [phone]  S Name/Age/Gender Jeffrey [name] 62 y.o. male Room/Bed: 030C/030C  Code Status   Code Status: Full Code  Home/SNF/Other Home Patient oriented to: self, place, time, and situation Is this baseline? Yes   Triage Complete: Triage complete  Chief Complaint Volume overload [E87.70]  Triage Note Pt BIB [facility] d/t concerns for acute onset of chest pain and SOB. Endorses generalized chest pain that radiates into the neck along weakness, lightheadedness, and feeling extremities swollen/heavy. Has had a yellow productive cough. Unknown sick exposure. Per note, pt on dialysis, Tues-Thur-Sat. Last full dialysis Tuesday. Chronic 3 L oxygen at baseline.      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   Wellbutrin [Bupropion] Swelling    Level of Care/Admitting Diagnosis ED Disposition     ED Disposition  Admit   Condition  --   Comment  Hospital Area: [facility] [[id number]]  Level of Care: Telemetry Medical [104]  May place patient in observation at [facility] or [place] if equivalent level of care is available:: No  Covid Evaluation: Asymptomatic - no recent exposure (last 10 days) testing not required  Diagnosis: Volume overload [163845]  Admitting Physician: Marcelyn Bruins [[id number]]  Attending Physician: Marcelyn Bruins [[id number]]          B Medical/Surgery History Past Medical History:  Diagnosis Date   Altered mental status 05/04/2021   Arthritis    Blood transfusion without reported diagnosis    CHF (congestive heart failure) (HCC)    COPD (chronic obstructive pulmonary disease) ([place])    Diabetes mellitus without  complication ([place])    ESRD on hemodialysis ([place])    Hypertension    PTSD (post-traumatic stress disorder)    Sepsis ([place])    Past Surgical History:  Procedure Laterality Date   A/V FISTULAGRAM N/A 08/30/2021   Procedure: A/V FISTULAGRAM;  Surgeon: Waynetta Sandy, MD;  Location: [place] [facility];  Service: Cardiovascular;  Laterality: N/A;   A/V FISTULAGRAM Right 07/28/2022   Procedure: A/V Fistulagram;  Surgeon: Waynetta Sandy, MD;  Location: [place] [facility];  Service: Cardiovascular;  Laterality: Right;   A/V FISTULAGRAM Right 09/05/2022   Procedure: A/V Fistulagram;  Surgeon: Waynetta Sandy, MD;  Location: [place] [facility];  Service: Cardiovascular;  Laterality: Right;   A/V FISTULAGRAM Right 10/31/2022   Procedure: A/V Fistulagram;  Surgeon: Waynetta Sandy, MD;  Location: [place] [facility];  Service: Cardiovascular;  Laterality: Right;   A/V SHUNT INTERVENTION Right 10/31/2022   Procedure: A/V SHUNT INTERVENTION;  Surgeon: Waynetta Sandy, MD;  Location: [place] [facility];  Service: Cardiovascular;  Laterality: Right;   A/V SHUNTOGRAM N/A 11/08/2021   Procedure: A/V SHUNTOGRAM;  Surgeon: Waynetta Sandy, MD;  Location: [place] [facility];  Service: Cardiovascular;  Laterality: N/A;   AMPUTATION Left 04/03/2021   Procedure: LEFT ABOVE-THE-KNEE AMPUTATION;  Surgeon: Erle Crocker, MD;  Location: [place];  Service: Orthopedics;  Laterality: Left;   AV FISTULA PLACEMENT Right 08/12/2021   Procedure: INSERTION OF ARTERIOVENOUS (AV) GORE-TEX GRAFT RIGHT ARM;  Surgeon: Serafina Mitchell, MD;  Location: [place];  Service:  Vascular;  Laterality: Right;   INSERTION OF DIALYSIS CATHETER N/A 11/25/2022   Procedure: INSERTION OF DIALYSIS CATHETER;  Surgeon: Candee Furbish, MD;  Location: [facility] ENDOS[facility];  Service: Pulmonary;  Laterality: N/A;   IR REMOVAL TUN CV CATH W/O FL  03/30/2021   JOINT REPLACEMENT     Bilateral knees    PERIPHERAL VASCULAR BALLOON ANGIOPLASTY Right 08/30/2021   Procedure: PERIPHERAL VASCULAR BALLOON ANGIOPLASTY;  Surgeon: Waynetta Sandy, MD;  Location: [place] [facility];  Service: Cardiovascular;  Laterality: Right;   PERIPHERAL VASCULAR BALLOON ANGIOPLASTY Right 07/28/2022   Procedure: PERIPHERAL VASCULAR BALLOON ANGIOPLASTY;  Surgeon: Waynetta Sandy, MD;  Location: [place] [facility];  Service: Cardiovascular;  Laterality: Right;  AVF   PERIPHERAL VASCULAR INTERVENTION  11/08/2021   Procedure: PERIPHERAL VASCULAR INTERVENTION;  Surgeon: Waynetta Sandy, MD;  Location: [place] [facility];  Service: Cardiovascular;;  Central rt arm fistula   UPPER EXTREMITY VENOGRAPHY Right 08/10/2021   Procedure: UPPER EXTREMITY VENOGRAPHY;  Surgeon: Serafina Mitchell, MD;  Location: [place] [facility];  Service: Cardiovascular;  Laterality: Right;     A IV Location/Drains/Wounds Patient Lines/Drains/Airways Status     Active Line/Drains/Airways     Name Placement date Placement time Site Days   Peripheral IV 12/29/22 22 G 2.5" Anterior;Left;Proximal Forearm 12/29/22  1523  Forearm  less than 1   Fistula / Graft Right Upper arm Arteriovenous vein graft 10/31/22  --  Upper arm  59   Fistula / Graft Right Upper arm Arteriovenous vein graft 08/12/21  1021  Upper arm  504            Intake/Output Last 24 hours  Intake/Output Summary (Last 24 hours) at 12/29/2022 1608 Last data filed at 12/29/2022 1007 Gross per 24 hour  Intake --  Output 240 ml  Net -240 ml    Labs/Imaging Results for orders placed or performed during the hospital encounter of 12/29/22 (from the past 48 hour(s))  Resp panel by RT-PCR (RSV, Flu A&B, Covid) Anterior Nasal Swab     Status: None   Collection Time: 12/29/22  4:17 AM   Specimen: Anterior Nasal Swab  Result Value Ref Range   SARS Coronavirus 2 by RT PCR NEGATIVE NEGATIVE   Influenza A by PCR NEGATIVE NEGATIVE   Influenza B by PCR NEGATIVE  NEGATIVE    Comment: (NOTE) The Xpert Xpress SARS-CoV-2/FLU/RSV plus assay is intended as an aid in the diagnosis of influenza from Nasopharyngeal swab specimens and should not be used as a sole basis for treatment. Nasal washings and aspirates are unacceptable for Xpert Xpress SARS-CoV-2/FLU/RSV testing.  Fact Sheet for Patients: EntrepreneurPulse.com.au  Fact Sheet for Healthcare Providers: IncredibleEmployment.be  This test is not yet approved or cleared by the [place] FDA and has been authorized for detection and/or diagnosis of SARS-CoV-2 by FDA under an Emergency Use Authorization (EUA). This EUA will remain in effect (meaning this test can be used) for the duration of the COVID-19 declaration under Section 564(b)(1) of the Act, 21 U.S.C. section 360bbb-3(b)(1), unless the authorization is terminated or revoked.     Resp Syncytial Virus by PCR NEGATIVE NEGATIVE    Comment: (NOTE) Fact Sheet for Patients: EntrepreneurPulse.com.au  Fact Sheet for Healthcare Providers: IncredibleEmployment.be  This test is not yet approved or cleared by the [place] FDA and has been authorized for detection and/or diagnosis of SARS-CoV-2 by FDA under an Emergency Use Authorization (EUA). This EUA will remain in effect (meaning this test  can be used) for the duration of the COVID-19 declaration under Section 564(b)(1) of the Act, 21 U.S.C. section 360bbb-3(b)(1), unless the authorization is terminated or revoked.  Performed at [facility], [place] [address] [place], [place] [address]   CBC with Differential     Status: Abnormal   Collection Time: 12/29/22  5:01 AM  Result Value Ref Range   WBC 7.0 4.0 - 10.5 K/uL   RBC 3.04 (L) 4.22 - 5.81 MIL/uL   Hemoglobin 8.7 (L) 13.0 - 17.0 g/dL   HCT 27.5 (L) 39.0 - 52.0 %   MCV 90.5 80.0 - 100.0 fL   MCH 28.6 26.0 - 34.0 pg   MCHC 31.6 30.0 - 36.0 g/dL    RDW 14.7 11.5 - 15.5 %   Platelets 102 (L) 150 - 400 K/uL   nRBC 0.0 0.0 - 0.2 %   Neutrophils Relative % 82 %   Neutro Abs 5.7 1.7 - 7.7 K/uL   Lymphocytes Relative 9 %   Lymphs Abs 0.6 (L) 0.7 - 4.0 K/uL   Monocytes Relative 6 %   Monocytes Absolute 0.4 0.1 - 1.0 K/uL   Eosinophils Relative 2 %   Eosinophils Absolute 0.2 0.0 - 0.5 K/uL   Basophils Relative 0 %   Basophils Absolute 0.0 0.0 - 0.1 K/uL   Immature Granulocytes 1 %   Abs Immature Granulocytes 0.05 0.00 - 0.07 K/uL    Comment: Performed at [facility], [place] [address] [place], [place] [address]  Comprehensive metabolic panel     Status: Abnormal   Collection Time: 12/29/22  5:01 AM  Result Value Ref Range   Sodium 137 135 - 145 mmol/L   Potassium 3.7 3.5 - 5.1 mmol/L   Chloride 99 98 - 111 mmol/L   CO2 26 22 - 32 mmol/L   Glucose, Bld 238 (H) 70 - 99 mg/dL    Comment: Glucose reference range applies only to samples taken after fasting for at least 8 hours.   BUN 60 (H) 8 - 23 mg/dL   Creatinine, Ser 3.86 (H) 0.61 - 1.24 mg/dL   Calcium 7.8 (L) 8.9 - 10.3 mg/dL   Total Protein 7.0 6.5 - 8.1 g/dL   Albumin 3.4 (L) 3.5 - 5.0 g/dL   AST 12 (L) 15 - 41 U/L   ALT 13 0 - 44 U/L   Alkaline Phosphatase 75 38 - 126 U/L   Total Bilirubin 0.6 0.3 - 1.2 mg/dL   GFR, Estimated 17 (L) >60 mL/min    Comment: (NOTE) Calculated using the CKD-EPI Creatinine Equation (2021)    Anion gap 12 5 - 15    Comment: Performed at [facility], [place] [address] [place], [place] [address]  Troponin I (High Sensitivity)     Status: None   Collection Time: 12/29/22  5:01 AM  Result Value Ref Range   Troponin I (High Sensitivity) 13 <18 ng/L    Comment: (NOTE) Elevated high sensitivity troponin I (hsTnI) values and significant  changes across serial measurements may suggest ACS but many other  chronic and acute conditions are known to elevate hsTnI results.  Refer to the "Links" section for chest pain algorithms and additional   guidance. Performed at [facility], [place] [address], [place], [place] [address]    DG Chest 2 View  Result Date: 12/29/2022 CLINICAL DATA:  Shortness of breath. EXAM: CHEST - 2 VIEW COMPARISON:  12/17/2022. FINDINGS: Heart is enlarged and the mediastinal  contour is within normal limits. A vascular stent is noted in the suprahilar region on the right. The pulmonary vasculature is mildly distended. Interstitial prominence is present bilaterally. No consolidation, effusion, or pneumothorax. Right shoulder arthroplasty changes are noted. Posterior cervical spinal fusion hardware is present. No acute osseous abnormality. IMPRESSION: 1. Cardiomegaly with pulmonary vascular congestion. 2. Interstitial prominence bilaterally, possible edema or infiltrate. Electronically Signed   By: Brett Fairy M.D.   On: 12/29/2022 04:50    Pending Labs Unresulted Labs (From admission, onward)     Start     Ordered   12/30/22 0500  Comprehensive metabolic panel  Tomorrow morning,   R        12/29/22 1550   12/30/22 0500  CBC  Tomorrow morning,   R        12/29/22 1550   12/29/22 1547  CBC  (heparin)  Once,   R       Comments: Baseline for heparin therapy IF NOT ALREADY DRAWN.  Notify MD if PLT < 100 K.    12/29/22 1550   12/29/22 1547  Creatinine, serum  (heparin)  Once,   R       Comments: Baseline for heparin therapy IF NOT ALREADY DRAWN.    12/29/22 1550            Vitals/Pain Today's Vitals   12/29/22 1304 12/29/22 1401 12/29/22 1439 12/29/22 1600  BP: (!) 105/46  117/61 (!) 140/60  Pulse: 91  89 89  Resp: 18  13 14   Temp: 98.3 F (36.8 C)     TempSrc: Oral     SpO2: 100%  100% 100%  Weight:      Height:      PainSc:  0-No pain      Isolation Precautions No active isolations  Medications Medications  pentafluoroprop-tetrafluoroeth (GEBAUERS) aerosol 1 Application (has no administration in time range)  lidocaine (PF) (XYLOCAINE) 1 % injection 5 mL (has no administration in  time range)  lidocaine-prilocaine (EMLA) cream 1 Application (has no administration in time range)  heparin injection 1,000 Units (has no administration in time range)  anticoagulant sodium citrate solution 5 mL (has no administration in time range)  alteplase (CATHFLO ACTIVASE) injection 2 mg (has no administration in time range)  amLODipine (NORVASC) tablet 10 mg (has no administration in time range)  aspirin EC tablet 81 mg (has no administration in time range)  atorvastatin (LIPITOR) tablet 40 mg (has no administration in time range)  calcium acetate (PHOSLO) capsule 1,334 mg (has no administration in time range)  carvedilol (COREG) tablet 12.5 mg (has no administration in time range)  clopidogrel (PLAVIX) tablet 75 mg (has no administration in time range)  metolazone (ZAROXOLYN) tablet 10 mg (has no administration in time range)  pantoprazole (PROTONIX) EC tablet 40 mg (has no administration in time range)  pregabalin (LYRICA) capsule 75 mg (has no administration in time range)  sertraline (ZOLOFT) tablet 100 mg (has no administration in time range)  tamsulosin (FLOMAX) capsule 0.4 mg (has no administration in time range)  torsemide (DEMADEX) tablet 120 mg (has no administration in time range)  traZODone (DESYREL) tablet 150 mg (has no administration in time range)  heparin injection 5,000 Units (has no administration in time range)  sodium chloride flush (NS) 0.9 % injection 3 mL (has no administration in time range)  acetaminophen (TYLENOL) tablet 650 mg (has no administration in time range)    Or  acetaminophen (TYLENOL) suppository 650 mg (has no administration  in time range)  polyethylene glycol (MIRALAX / GLYCOLAX) packet 17 g (has no administration in time range)  HYDROcodone-acetaminophen (NORCO/VICODIN) 5-325 MG per tablet 1 tablet (1 tablet Oral Given 12/29/22 0542)  furosemide (LASIX) tablet 160 mg (160 mg Oral Given 12/29/22 1506)    Mobility power wheelchair     Focused  Assessments Renal Assessment Handoff:  Hemodialysis Schedule: Hemodialysis Schedule: Tuesday/Thursday/Saturday Last Hemodialysis date and time: today 2.1L off   Restricted appendage: right arm   R Recommendations: See Admitting Provider Note  Report given to:   Additional Notes: [place] guidide IV in left FA  3L [place] all the time

## 2022-12-29 NOTE — ED Triage Notes (Signed)
Pt BIB [facility] d/t concerns for acute onset of chest pain and SOB. Endorses generalized chest pain that radiates into the neck along weakness, lightheadedness, and feeling extremities swollen/heavy. Has had a yellow productive cough. Unknown sick exposure. Per note, pt on dialysis, Tues-Thur-Sat. Last full dialysis Tuesday. Chronic 3 L oxygen at baseline.

## 2022-12-29 NOTE — Progress Notes (Signed)
Aware that Mr. Cullens is here and needing dialysis - orders entered. Full note to follow.  Veneta Penton, PA-C [employer] Pager [phone]

## 2022-12-29 NOTE — ED Provider Triage Note (Signed)
Emergency Medicine Provider Triage Evaluation Note  Jeffrey [name] , a 62 y.o. male  was evaluated in triage.  Pt complains of chest pain and SOB that began tonight.  Also has some increased extremity edema.  Productive cough with yellow phelgm.  ESRD on HD, last treatment on Tuesday.  States he often has to get dialyzed early.  On chronic 3L home O2.  Review of Systems  Positive: Chest pain, SOB, cough Negative: fever  Physical Exam  BP (!) 156/49 (BP Location: Left Arm)   Pulse 92   Temp 98.5 F (36.9 C) (Oral)   Resp 19   SpO2 97%   Gen:   Awake, no distress   Resp:  Normal effort  MSK:   Moves extremities without difficulty  Other:  On chronic 3L  Medical Decision Making  Medically screening exam initiated at 4:12 AM.  Appropriate orders placed.  Jeffrey [name] was informed that the remainder of the evaluation will be completed by another provider, this initial triage assessment does not replace that evaluation, and the importance of remaining in the ED until their evaluation is complete.  Chest pain, SOB, edema.  Productive cough as well.  On baseline 3L, sats 97%.  EKG, labs, RVP, CXR.   Larene Pickett, PA-C 12/29/22 [phone]
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
## 2022-12-29 NOTE — ED Notes (Signed)
Patient transported to dialysis
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
## 2022-12-30 DIAGNOSIS — E1151 Type 2 diabetes mellitus with diabetic peripheral angiopathy without gangrene: Secondary | ICD-10-CM | POA: Diagnosis present

## 2022-12-30 DIAGNOSIS — Z992 Dependence on renal dialysis: Secondary | ICD-10-CM | POA: Diagnosis not present

## 2022-12-30 DIAGNOSIS — I132 Hypertensive heart and chronic kidney disease with heart failure and with stage 5 chronic kidney disease, or end stage renal disease: Secondary | ICD-10-CM | POA: Diagnosis present

## 2022-12-30 DIAGNOSIS — Z794 Long term (current) use of insulin: Secondary | ICD-10-CM | POA: Diagnosis not present

## 2022-12-30 DIAGNOSIS — E1142 Type 2 diabetes mellitus with diabetic polyneuropathy: Secondary | ICD-10-CM | POA: Diagnosis present

## 2022-12-30 DIAGNOSIS — D631 Anemia in chronic kidney disease: Secondary | ICD-10-CM | POA: Diagnosis present

## 2022-12-30 DIAGNOSIS — N186 End stage renal disease: Secondary | ICD-10-CM | POA: Diagnosis present

## 2022-12-30 DIAGNOSIS — D509 Iron deficiency anemia, unspecified: Secondary | ICD-10-CM | POA: Diagnosis present

## 2022-12-30 DIAGNOSIS — G894 Chronic pain syndrome: Secondary | ICD-10-CM | POA: Diagnosis present

## 2022-12-30 DIAGNOSIS — E1165 Type 2 diabetes mellitus with hyperglycemia: Secondary | ICD-10-CM | POA: Diagnosis present

## 2022-12-30 DIAGNOSIS — Z1152 Encounter for screening for COVID-19: Secondary | ICD-10-CM | POA: Diagnosis not present

## 2022-12-30 DIAGNOSIS — E8779 Other fluid overload: Secondary | ICD-10-CM | POA: Diagnosis not present

## 2022-12-30 DIAGNOSIS — Z6841 Body Mass Index (BMI) 40.0 and over, adult: Secondary | ICD-10-CM | POA: Diagnosis not present

## 2022-12-30 DIAGNOSIS — E782 Mixed hyperlipidemia: Secondary | ICD-10-CM | POA: Diagnosis present

## 2022-12-30 DIAGNOSIS — E1169 Type 2 diabetes mellitus with other specified complication: Secondary | ICD-10-CM | POA: Diagnosis present

## 2022-12-30 DIAGNOSIS — F330 Major depressive disorder, recurrent, mild: Secondary | ICD-10-CM | POA: Diagnosis present

## 2022-12-30 DIAGNOSIS — Z89612 Acquired absence of left leg above knee: Secondary | ICD-10-CM | POA: Diagnosis not present

## 2022-12-30 DIAGNOSIS — K219 Gastro-esophageal reflux disease without esophagitis: Secondary | ICD-10-CM | POA: Diagnosis present

## 2022-12-30 DIAGNOSIS — E1122 Type 2 diabetes mellitus with diabetic chronic kidney disease: Secondary | ICD-10-CM | POA: Diagnosis present

## 2022-12-30 DIAGNOSIS — I5032 Chronic diastolic (congestive) heart failure: Secondary | ICD-10-CM | POA: Diagnosis present

## 2022-12-30 DIAGNOSIS — Z87891 Personal history of nicotine dependence: Secondary | ICD-10-CM | POA: Diagnosis not present

## 2022-12-30 DIAGNOSIS — Z79899 Other long term (current) drug therapy: Secondary | ICD-10-CM | POA: Diagnosis not present

## 2022-12-30 DIAGNOSIS — E8770 Fluid overload, unspecified: Secondary | ICD-10-CM | POA: Diagnosis present

## 2022-12-30 DIAGNOSIS — J449 Chronic obstructive pulmonary disease, unspecified: Secondary | ICD-10-CM | POA: Diagnosis present

## 2022-12-30 LAB — CBC
HCT: 27.9 % — ABNORMAL LOW (ref 39.0–52.0)
Hemoglobin: 8.7 g/dL — ABNORMAL LOW (ref 13.0–17.0)
MCH: 28.2 pg (ref 26.0–34.0)
MCHC: 31.2 g/dL (ref 30.0–36.0)
MCV: 90.3 fL (ref 80.0–100.0)
Platelets: 106 10*3/uL — ABNORMAL LOW (ref 150–400)
RBC: 3.09 MIL/uL — ABNORMAL LOW (ref 4.22–5.81)
RDW: 14.6 % (ref 11.5–15.5)
WBC: 5.4 10*3/uL (ref 4.0–10.5)
nRBC: 0 % (ref 0.0–0.2)

## 2022-12-30 LAB — COMPREHENSIVE METABOLIC PANEL
ALT: 13 U/L (ref 0–44)
AST: 11 U/L — ABNORMAL LOW (ref 15–41)
Albumin: 3.3 g/dL — ABNORMAL LOW (ref 3.5–5.0)
Alkaline Phosphatase: 72 U/L (ref 38–126)
Anion gap: 9 (ref 5–15)
BUN: 45 mg/dL — ABNORMAL HIGH (ref 8–23)
CO2: 28 mmol/L (ref 22–32)
Calcium: 8.2 mg/dL — ABNORMAL LOW (ref 8.9–10.3)
Chloride: 97 mmol/L — ABNORMAL LOW (ref 98–111)
Creatinine, Ser: 3.38 mg/dL — ABNORMAL HIGH (ref 0.61–1.24)
GFR, Estimated: 20 mL/min — ABNORMAL LOW (ref >60)
Glucose, Bld: 301 mg/dL — ABNORMAL HIGH (ref 70–99)
Potassium: 4 mmol/L (ref 3.5–5.1)
Sodium: 134 mmol/L — ABNORMAL LOW (ref 135–145)
Total Bilirubin: 0.3 mg/dL (ref 0.3–1.2)
Total Protein: 7 g/dL (ref 6.5–8.1)

## 2022-12-30 LAB — GLUCOSE, CAPILLARY
Glucose-Capillary: 249 mg/dL — ABNORMAL HIGH (ref 70–99)
Glucose-Capillary: 299 mg/dL — ABNORMAL HIGH (ref 70–99)
Glucose-Capillary: 289 mg/dL — ABNORMAL HIGH (ref 70–99)

## 2022-12-30 NOTE — Progress Notes (Signed)
Received patient in bed to unit.  Alert and oriented.  Informed consent signed and in chart.   HD tx duration: 4hrs  Patient tolerated well.  Transported back to the room  Alert, without acute distress.  Hand-off given to patient's nurse.   Access used: R AVG Access issues: None  Total UF removed: 4048ml Medication(s) given: None Post HD weight: 132.5kg    12/30/22 1207  Vitals  Temp 97.7 F (36.5 C)  Temp Source Oral  BP 135/79  MAP (mmHg) 96  Pulse Rate 78  ECG Heart Rate 78  Resp 17  Oxygen Therapy  SpO2 100 %  O2 Device Nasal Cannula  O2 Flow Rate (L/min) 3 L/min  During Treatment Monitoring  Intra-Hemodialysis Comments Tolerated well;Tx completed  Post Treatment  Dialyzer Clearance Clear  Duration of HD Treatment -hour(s) 4 hour(s)  Liters Processed 96  Fluid Removed (mL) 4000 mL  Tolerated HD Treatment Yes  Fistula / Graft Right Upper arm Arteriovenous vein graft  Placement Date: 10/31/22   Placed prior to admission: Yes  Orientation: Right  Access Location: Upper arm  Access Type: Arteriovenous vein graft  Site Condition No complications  Fistula / Graft Assessment Present;Thrill;Bruit  Status Deaccessed    [facility]

## 2022-12-30 NOTE — Procedures (Signed)
Patient was seen on dialysis and the procedure was supervised.  BFR 400  Via TDC BP is  148/75. UF as tolerated.    Patient appears to be tolerating treatment well. He comes to ER for dialysis.    Jeffrey [name] Tanna Furry 12/30/2022

## 2022-12-30 NOTE — Progress Notes (Signed)
Pt refuseing cpap at this time.

## 2022-12-30 NOTE — Inpatient Diabetes Management (Signed)
Inpatient Diabetes Program Recommendations  AACE/ADA: New Consensus Statement on Inpatient Glycemic Control (2015)  Target Ranges:  Prepandial:   less than 140 mg/dL      Peak postprandial:   less than 180 mg/dL (1-2 hours)      Critically ill patients:  140 - 180 mg/dL   Lab Results  Component Value Date   GLUCAP 330 (H) 12/29/2022   HGBA1C 10.5 (H) 11/25/2022    Review of Glycemic Control  Diabetes history: type 2 Outpatient Diabetes medications: U-500 insulin 140 units BID Current orders for Inpatient glycemic control: Semglee 60 units BID, Novolog 0-20 units correction scale TID  Inpatient Diabetes Program Recommendations:   Noted that blood sugars today have been >300 mg/dl. Patient has been in dialysis and CBGs have not been done since yesterday at [facility].   Recommend discontinuing Semglee and starting U-500 75 units BID if blood sugars continue to be elevated >180 mg/dl. Patient was on U-500 insulin during previous inpatient admission on 11/30/22. Continue Novolog 0-20 units correction scale as ordered.   Harvel Ricks RN BSN CDE Diabetes Coordinator Pager: [phone]  8am-5pm

## 2022-12-30 NOTE — Progress Notes (Signed)
PROGRESS NOTE    NICHAEL [name]  SWH:[id number] DOB: 1961-02-16 DOA: 12/29/2022 PCP: Kristie Cowman, MD    Brief Narrative:   Jeffrey [name] is a 62 y.o. male with past medical history significant for DM2, HLD, ESRD on HD, COPD, chronic diastolic congestive heart failure, GERD, peripheral neuropathy, PAD s/p left AKA, depression/PTSD, mood disorder, OSA on CPAP, morbid obesity, chronic pain syndrome who presented to [facility] ED on 2/1 with shortness of breath and chest pain.  Patient reports ongoing shortness of breath and feels like he needs dialysis.  Patient has been getting dialysis intermittently through the ED as he has been discharged from his home dialysis center for behavioral issues.  His last dialysis session was on 1/30; and prior to that 1/25.  Patient on 3 L chronic supplemental oxygen.  Denies fever, no chills, no abdominal pain, no nausea/vomiting/diarrhea.  In the ED, temperature 98.5 F, HR 92, RR 19, BP 156/49, SpO2 97% on 3 L nasal cannula.  WBC 7.0, hemoglobin 8.7, platelets 102.  Sodium 137, potassium 3.7, chloride 99, CO2 26, glucose 238, BUN 60, creatinine 3.86.  AST 12, ALT 13, total bilirubin 0.6.  High sensitive troponin 13.  Cova-19 PCR/influenza A/B/RSV PCR negative.  Chest x-ray with cardiomegaly with pulmonary vascular congestion, interstitial prominence bilaterally possible edema versus infiltrate.  Nephrology was consulted and patient underwent hemodialysis while boarding in the ED.  Following dialysis patient continued with dyspnea and nephrology recommended [place] admission for continued hemodialysis the following day.  Hospitalist service consulted for admission.  Assessment & Plan:   Volume overload ESRD on HD, intermittent Patient presenting to ED for continued hemodialysis, no longer dialyzes outpatient as he was discharged from his HD clinic due to 8 behavioral issues.  He gets intermittent dialysis on presentation to the ED, last on 1/30 and previously on 1/25.  Patient  is currently stable on his 3 L chronic nasal cannula but chest x-ray on admission notable for vascular congestion consistent with volume overload.  Etiology to his presentation related to his insufficient frequency of receiving hemodialysis.  Per nephrology notes to receive HD in the ED on a TTS schedule. -- Nephrology following, appreciate assistance -- HD yesterday, repeating session today --Renal diet, 1200 mL fluid restriction --Torsemide 120 mg p.o. twice daily -- Continue supplemental oxygen, maintain SpO2 greater than 92%, stable on his home 3 L nasal cannula -- Further per nephrology  Type 2 diabetes mellitus, with hyperglycemia Home regimen includes Humulin R U500 140 units [place] BID.  Last hemoglobin A1c 10.5 on 11/25/2022, poorly controlled. -- Semglee 60 units [place] BID -- Resistant SSI for coverage -- CBGs qAC/HS  Hyperlipidemia -- Atorvastatin 40 mg p.o. daily  Chronic diastolic congestive heart failure Essential hypertension -- Amlodipine 10 mg p.o. daily -- Torsemide 120 mg p.o. twice daily -- Metolazone 10 mg p.o. daily -- Continue aspirin and statin -- Volume management with HD -- Renal diet with 1200 mL fluid restriction -- Strict I's and O's and daily weights  Depression/PTSD/mood disorder -- Sertraline 100 mg p.o. daily -- Nortriptyline 20 mg p.o. nightly  OSA -- Continue nocturnal CPAP  Chronic pain syndrome -- Lyrica 75 mg p.o. on Tuesday/Thursday/Saturday  Peripheral vascular disease s/p left AKA -- Continue aspirin/Plavix/statin  BPH: Continue tamsulosin  GERD: -- Protonix 40 mg p.o. daily  Morbid obesity Body mass index is 48.61 kg/m.  Discussed with patient needs for aggressive lifestyle changes/weight loss as this complicates all facets of care.  Outpatient follow-up with PCP.  DVT prophylaxis: heparin injection 5,000 Units Start: 12/29/22 1600    Code Status: Full Code Family Communication: No family present bedside this  morning  Disposition Plan:  Level of care: Telemetry Medical Status is: Observation The patient remains OBS appropriate and will d/c before 2 midnights.    Consultants:  Nephrology  Procedures:  None  Antimicrobials:  None   Subjective: Patient seen examined bedside, resting complaint.  Currently undergoing HD.  Continues with mild shortness of breath, although much improved since yesterday.  No other specific complaints or concerns at this time.  Denies headache, no dizziness, no chest pain, no palpitations, no fever/chills/night sweats, no nausea/vomiting/diarrhea, no focal weakness, no paresthesias.  No acute events overnight per nursing staff.  Objective: Vitals:   12/30/22 1100 12/30/22 1130 12/30/22 1200 12/30/22 1207  BP: (!) 168/84 (!) 150/75 134/76 135/79  Pulse: 85 79 78 78  Resp: 16 18 18 17   Temp:    97.7 F (36.5 C)  TempSrc:    Oral  SpO2: 100% 97% 100% 100%  Weight:    132.5 kg  Height:        Intake/Output Summary (Last 24 hours) at 12/30/2022 1233 Last data filed at 12/30/2022 1207 Gross per 24 hour  Intake --  Output 4750 ml  Net -4750 ml   Filed Weights   12/30/22 0500 12/30/22 0744 12/30/22 1207  Weight: 128.3 kg 135.8 kg 132.5 kg    Examination:  Physical Exam: GEN: NAD, alert and oriented x 3, chronically ill parents, appears older than stated age, obese HEENT: NCAT, PERRL, EOMI, sclera clear, MMM PULM: Breath sounds slightly diminished bilateral bases with mild crackles, no wheezing, normal respiratory effort without accessory muscle use, on 3 L nasal cannula which is his baseline with SpO2 100% at rest CV: RRR w/o M/G/R GI: abd soft, NTND, NABS, no R/G/M MSK: Left AKA noted NEURO: No focal deficits PSYCH: normal mood/affect Integumentary: dry/intact, no rashes or wounds    Data Reviewed: I have personally reviewed following labs and imaging studies  CBC: Recent Labs  Lab 12/27/22 0838 12/29/22 0501 12/29/22 1709 12/30/22 0800   WBC 6.6 7.0 6.1 5.4  NEUTROABS  --  5.7  --   --   HGB 8.7* 8.7* 8.8* 8.7*  HCT 28.9* 27.5* 27.8* 27.9*  MCV 92.6 90.5 90.8 90.3  PLT 97* 102* 85* 366*   Basic Metabolic Panel: Recent Labs  Lab 12/27/22 0838 12/29/22 0501 12/29/22 1709 12/30/22 0800  NA 141 137  --  134*  K 3.7 3.7  --  4.0  CL 100 99  --  97*  CO2 28 26  --  28  GLUCOSE 169* 238*  --  301*  BUN 76* 60*  --  45*  CREATININE 4.14* 3.86* 2.85* 3.38*  CALCIUM 7.8* 7.8*  --  8.2*   GFR: Estimated Creatinine Clearance: 28.8 mL/min (A) (by C-G formula based on SCr of 3.38 mg/dL (H)). Liver Function Tests: Recent Labs  Lab 12/29/22 0501 12/30/22 0800  AST 12* 11*  ALT 13 13  ALKPHOS 75 72  BILITOT 0.6 0.3  PROT 7.0 7.0  ALBUMIN 3.4* 3.3*   No results for input(s): "LIPASE", "AMYLASE" in the last 168 hours. No results for input(s): "AMMONIA" in the last 168 hours. Coagulation Profile: No results for input(s): "INR", "PROTIME" in the last 168 hours. Cardiac Enzymes: No results for input(s): "CKTOTAL", "CKMB", "CKMBINDEX", "TROPONINI" in the last 168 hours. BNP (last 3 results) No results for input(s): "PROBNP" in  the last 8760 hours. HbA1C: No results for input(s): "HGBA1C" in the last 72 hours. CBG: Recent Labs  Lab 12/29/22 1701 12/29/22 2123  GLUCAP 312* 330*   Lipid Profile: No results for input(s): "CHOL", "HDL", "LDLCALC", "TRIG", "CHOLHDL", "LDLDIRECT" in the last 72 hours. Thyroid Function Tests: No results for input(s): "TSH", "T4TOTAL", "FREET4", "T3FREE", "THYROIDAB" in the last 72 hours. Anemia Panel: No results for input(s): "VITAMINB12", "FOLATE", "FERRITIN", "TIBC", "IRON", "RETICCTPCT" in the last 72 hours. Sepsis Labs: No results for input(s): "PROCALCITON", "LATICACIDVEN" in the last 168 hours.  Recent Results (from the past 240 hour(s))  Resp panel by RT-PCR (RSV, Flu A&B, Covid) Anterior Nasal Swab     Status: None   Collection Time: 12/29/22  4:17 AM   Specimen: Anterior  Nasal Swab  Result Value Ref Range Status   SARS Coronavirus 2 by RT PCR NEGATIVE NEGATIVE Final   Influenza A by PCR NEGATIVE NEGATIVE Final   Influenza B by PCR NEGATIVE NEGATIVE Final    Comment: (NOTE) The Xpert Xpress SARS-CoV-2/FLU/RSV plus assay is intended as an aid in the diagnosis of influenza from Nasopharyngeal swab specimens and should not be used as a sole basis for treatment. Nasal washings and aspirates are unacceptable for Xpert Xpress SARS-CoV-2/FLU/RSV testing.  Fact Sheet for Patients: EntrepreneurPulse.com.au  Fact Sheet for Healthcare Providers: IncredibleEmployment.be  This test is not yet approved or cleared by the [place] FDA and has been authorized for detection and/or diagnosis of SARS-CoV-2 by FDA under an Emergency Use Authorization (EUA). This EUA will remain in effect (meaning this test can be used) for the duration of the COVID-19 declaration under Section 564(b)(1) of the Act, 21 U.S.C. section 360bbb-3(b)(1), unless the authorization is terminated or revoked.     Resp Syncytial Virus by PCR NEGATIVE NEGATIVE Final    Comment: (NOTE) Fact Sheet for Patients: EntrepreneurPulse.com.au  Fact Sheet for Healthcare Providers: IncredibleEmployment.be  This test is not yet approved or cleared by the [place] FDA and has been authorized for detection and/or diagnosis of SARS-CoV-2 by FDA under an Emergency Use Authorization (EUA). This EUA will remain in effect (meaning this test can be used) for the duration of the COVID-19 declaration under Section 564(b)(1) of the Act, 21 U.S.C. section 360bbb-3(b)(1), unless the authorization is terminated or revoked.  Performed at [facility], [place] [address], [place], [place] [address]          Radiology Studies: DG Chest 2 View  Result Date: 12/29/2022 CLINICAL DATA:  Shortness of breath. EXAM: CHEST - 2 VIEW  COMPARISON:  12/17/2022. FINDINGS: Heart is enlarged and the mediastinal contour is within normal limits. A vascular stent is noted in the suprahilar region on the right. The pulmonary vasculature is mildly distended. Interstitial prominence is present bilaterally. No consolidation, effusion, or pneumothorax. Right shoulder arthroplasty changes are noted. Posterior cervical spinal fusion hardware is present. No acute osseous abnormality. IMPRESSION: 1. Cardiomegaly with pulmonary vascular congestion. 2. Interstitial prominence bilaterally, possible edema or infiltrate. Electronically Signed   By: Brett Fairy M.D.   On: 12/29/2022 04:50        Scheduled Meds:  amLODipine  10 mg Oral q morning   aspirin EC  81 mg Oral q AM   atorvastatin  40 mg Oral q AM   calcium acetate  1,334 mg Oral TID with meals   carvedilol  12.5 mg Oral BID WC   clopidogrel  75 mg Oral Daily   heparin  5,000 Units Subcutaneous  Q8H   insulin aspart  0-20 Units Subcutaneous TID WC   insulin glargine-yfgn  60 Units Subcutaneous BID   metolazone  10 mg Oral Daily   nortriptyline  20 mg Oral QHS   pantoprazole  40 mg Oral QAC breakfast   [START ON 12/31/2022] pregabalin  75 mg Oral Q T,Th,Sa-HD   sertraline  100 mg Oral q AM   sodium chloride flush  3 mL Intravenous Q12H   tamsulosin  0.4 mg Oral Daily   torsemide  120 mg Oral BID   traZODone  150 mg Oral QHS   Continuous Infusions:  anticoagulant sodium citrate       LOS: 0 days    Time spent: 52 minutes spent on chart review, discussion with nursing staff, consultants, updating family and interview/physical exam; more than 50% of that time was spent in counseling and/or coordination of care.    Yovan Leeman J [place] DO [facility] Available via Epic secure chat 7am-7pm After these hours, please refer to coverage provider listed on [email or web address] 12/30/2022, 12:33 PM

## 2022-12-30 NOTE — Care Management Obs Status (Signed)
[place] NOTIFICATION   Patient Details  Name: Jeffrey [name] MRN: [id number] Date of Birth: 12/06/1960   Medicare Observation Status Notification Given:  Yes    Tom-Johnson, Renea Ee, RN 12/30/2022, 3:33 PM

## 2022-12-30 NOTE — Progress Notes (Signed)
Pt well known to navigator. Pt was d/c from local out-pt HD clinic due to behaviors. Pt has been denied by all local [facility] and [facility] clinics. Pt's insurance is out-of-network with all Health System clinics, therefore those clinics are not an option either. Pt currently has no accepting out-pt HD clinic. Pt returns to local ED when HD is needed.   Melven Sartorius Renal Navigator [phone]

## 2022-12-30 NOTE — TOC Initial Note (Signed)
Transition of Care [facility]) - Initial/Assessment Note    Patient Details  Name: Jeffrey [name] MRN: [id number] Date of Birth: 1961-07-04  Transition of Care [facility]) CM/SW Contact:    Tom-Johnson, Renea Ee, RN Phone Number: 12/30/2022, 3:43 PM  Clinical Narrative:                  CM spoke with patient at bedside about needs for post hospital transition. Admitted for Volume Overload.  CM introduced self and explained reason for assessment. Patient noted to be apprehensive, stated he has all the support he needed and CM should not ask him any questions but then requested an outpatient dialysis center. CM informed patient that Renal Navigator will be notified of his request.  Renal Navigator stated patient has been getting dialysis intermittently through the ED as he has been discharged from his home dialysis center for behavioral issues. He was denied by all local outpatient dialysis clinics both [facility] and [facility] clinics and also clinics in [place] does not accept his insurance.  Patient is on 3L home O2 from [place] and uses a Motorized wheelchair. Uses Medicaid transportation for his appointments.  Has PCS 5 days /wk, 2hrs/day from [facility]  CM will continue to follow as patient progresses towards discharge.       Expected Discharge Plan: Home/Self Care Barriers to Discharge: Continued Medical Work up   Patient Goals and CMS Choice Patient states their goals for this hospitalization and ongoing recovery are:: To return home CMS Medicare.gov Compare Post Acute Care list provided to:: Patient Choice offered to / list presented to : NA      Expected Discharge Plan and Services   Discharge Planning Services: CM Consult Post Acute Care Choice: NA Living arrangements for the past 2 months: Apartment                 DME Arranged: N/A DME Agency: NA       HH Arranged: NA [place] Agency: NA        Prior Living Arrangements/Services Living arrangements for  the past 2 months: Apartment Lives with:: Self Patient language and need for interpreter reviewed:: Yes Do you feel safe going back to the place where you live?: Yes      Need for Family Participation in Patient Care: Yes (Comment) Care giver support system in place?: Yes (comment) Current home services: Homehealth aide Criminal Activity/Legal Involvement Pertinent to Current Situation/Hospitalization: No - Comment as needed  Activities of Daily Living   ADL Screening (condition at time of admission) Patient's cognitive ability adequate to safely complete daily activities?: Yes Is the patient deaf or have difficulty hearing?: No Does the patient have difficulty seeing, even when wearing glasses/contacts?: No Does the patient have difficulty concentrating, remembering, or making decisions?: No Patient able to express need for assistance with ADLs?: No Does the patient have difficulty dressing or bathing?: Yes Independently performs ADLs?: No Communication: Independent Dressing (OT): Needs assistance Is this a change from baseline?: Pre-admission baseline Grooming: Needs assistance Is this a change from baseline?: Pre-admission baseline Feeding: Independent Bathing: Needs assistance Is this a change from baseline?: Pre-admission baseline Toileting: Needs assistance Is this a change from baseline?: Pre-admission baseline In/Out Bed: Needs assistance Is this a change from baseline?: Pre-admission baseline Walks in Home: Needs assistance Is this a change from baseline?: Pre-admission baseline Does the patient have difficulty walking or climbing stairs?: Yes Weakness of Legs: Right Weakness of Arms/Hands: None  Permission Sought/Granted Permission sought  to share information with : Case Manager, Family Supports, Chartered certified accountant granted to share information with : Yes, Verbal Permission Granted              Emotional Assessment Appearance:: Appears  stated age Attitude/Demeanor/Rapport: Apprehensive Affect (typically observed): Agitated Orientation: : Oriented to Self, Oriented to Place, Oriented to  Time, Oriented to Situation Alcohol / Substance Use: Not Applicable Psych Involvement: No (comment)  Admission diagnosis:  Volume overload [E87.70] End-stage renal disease needing dialysis ([place]) [N18.6, Z99.2] Patient Active Problem List   Diagnosis Date Noted   Acute on chronic respiratory failure with hypoxia ([place]) 11/24/2022   Uremia 11/24/2022   Encounter for removal of sutures 07/15/2022   Stress reaction of bone 07/12/2022   Benign prostatic hyperplasia with weak urinary stream 12/30/2021   Swelling of right upper extremity 11/07/2021   S/P reverse total shoulder arthroplasty, right 10/27/2021   AV graft malfunction ([place]) 09/01/2021   Dialysis AV fistula malfunction, initial encounter ([place]) 08/30/2021   Mood disorder ([place]) 08/30/2021   Disorder of ligament, right ankle 08/03/2021   Pain in right ankle and joints of right foot 08/03/2021   Local infection due to central venous catheter, sequela 07/21/2021   Acquired absence of left leg below knee ([place]) 06/21/2021   S/P AKA (above knee amputation), left ([place]) 05/04/2021   Bacteremia 04/20/2021   Other specified joint disorders, left knee 04/20/2021   Iron deficiency anemia, unspecified 04/13/2021   Anemia of chronic disease    Infection of prosthetic left knee joint ([place])    MRSA bacteremia 03/30/2021   Left knee pain    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    Fever 03/29/2021   End stage renal disease ([place]) 03/29/2021   COPD (chronic obstructive pulmonary disease) ([place]) 03/29/2021   Type 2 diabetes mellitus with hyperlipidemia ([place]) 03/29/2021   PTSD (post-traumatic stress disorder) 03/29/2021   Dyspnea, unspecified 10/12/2020   Pain in arm, unspecified 10/12/2020   Mild protein-calorie malnutrition ([place]) 09/06/2020   Status post cervical spinal  fusion 08/31/2020   Controlled substance agreement signed 08/30/2020   Pain, unspecified 08/20/2020   Secondary hyperparathyroidism of renal origin ([place]) 08/15/2020   Allergy, unspecified, sequela 07/21/2020   Coagulation defect, unspecified ([place]) 07/21/2020   Encounter for immunization 07/21/2020   Personal history of anaphylaxis 07/21/2020   Cervical myelopathy ([place]) 07/03/2020   Bilateral bunions 05/08/2020   Hammer toes of both feet 05/08/2020   Onychomycosis of toenail 05/08/2020   Muscle cramps 05/07/2020   History of 2019 novel coronavirus disease (COVID-19) 11/06/2019   Cervical radiculopathy 10/28/2019   Volume overload 06/25/2019   Degenerative disc disease, cervical 04/02/2019   History of MRSA infection 10/04/2018   Spinal stenosis of lumbosacral region 08/28/2018   Primary osteoarthritis of both shoulders 02/12/2018   Rotator cuff arthropathy of both shoulders 02/12/2018   Lumbar degenerative disc disease 01/25/2018   Chronic midline low back pain without sciatica 12/15/2017   History of colon polyps 08/15/2016   Mild episode of recurrent major depressive disorder ([place]) 05/30/2016   Vitamin D deficiency 04/18/2016   Chronic insomnia 12/21/2015   Pulmonary hypertension, mild ([place]) 04/29/2014   Chronic gastritis without bleeding 01/13/2014   Diabetic polyneuropathy associated with type 2 diabetes mellitus ([place]) 01/13/2014   Chronic diastolic heart failure ([place]) 12/24/2013   OSA on CPAP 12/24/2013   Primary osteoarthritis of both knees 08/26/2013   Primary osteoarthritis of right knee 08/26/2013   Status post total bilateral knee replacement  05/02/2013   Mixed hyperlipidemia 07/30/2012   Class 3 severe obesity due to excess calories with serious comorbidity and body mass index (BMI) of 45.0 to 49.9 in adult ([place]) 07/27/2012   Gastroesophageal reflux disease without esophagitis 05/12/2008   Erectile dysfunction due to diseases classified elsewhere 01/19/2005   PCP:   Kristie Cowman, MD Pharmacy:   [facility] [place], [place] AT [facility] [address] [place] [address] Phone: [phone] Fax: [phone]     Social Determinants of Health ([place]) Social History: [place]: No Food Insecurity (11/24/2022)  Housing: Low Risk  (11/24/2022)  Transportation Needs: No Transportation Needs (11/24/2022)  Utilities: Not At Risk (11/24/2022)  Tobacco Use: Medium Risk (12/29/2022)   SDOH Interventions:     Readmission Risk Interventions    11/03/2022   10:19 AM 04/07/2021    3:59 PM  Readmission Risk Prevention Plan  Transportation Screening Complete Complete  Medication Review ([place]) Complete Referral to Pharmacy  PCP or Specialist appointment within 3-5 days of discharge  Complete  HRI or Home Care Consult Complete Complete  SW Recovery Care/Counseling Consult Complete Complete  Palliative Care Screening Not Applicable Not Applicable  [place] Not Applicable Complete

## 2022-12-31 DIAGNOSIS — E8779 Other fluid overload: Secondary | ICD-10-CM | POA: Diagnosis not present

## 2022-12-31 LAB — GLUCOSE, CAPILLARY
Glucose-Capillary: 327 mg/dL — ABNORMAL HIGH (ref 70–99)
Glucose-Capillary: 291 mg/dL — ABNORMAL HIGH (ref 70–99)
Glucose-Capillary: 293 mg/dL — ABNORMAL HIGH (ref 70–99)
Glucose-Capillary: 174 mg/dL — ABNORMAL HIGH (ref 70–99)

## 2022-12-31 LAB — RENAL FUNCTION PANEL
Albumin: 3.2 g/dL — ABNORMAL LOW (ref 3.5–5.0)
Anion gap: 11 (ref 5–15)
BUN: 38 mg/dL — ABNORMAL HIGH (ref 8–23)
CO2: 28 mmol/L (ref 22–32)
Calcium: 8.6 mg/dL — ABNORMAL LOW (ref 8.9–10.3)
Chloride: 93 mmol/L — ABNORMAL LOW (ref 98–111)
Creatinine, Ser: 3.57 mg/dL — ABNORMAL HIGH (ref 0.61–1.24)
GFR, Estimated: 18 mL/min — ABNORMAL LOW (ref >60)
Glucose, Bld: 284 mg/dL — ABNORMAL HIGH (ref 70–99)
Phosphorus: 3.6 mg/dL (ref 2.5–4.6)
Potassium: 3.5 mmol/L (ref 3.5–5.1)
Sodium: 132 mmol/L — ABNORMAL LOW (ref 135–145)

## 2022-12-31 LAB — CBC
HCT: 28.5 % — ABNORMAL LOW (ref 39.0–52.0)
Hemoglobin: 8.9 g/dL — ABNORMAL LOW (ref 13.0–17.0)
MCH: 28.3 pg (ref 26.0–34.0)
MCHC: 31.2 g/dL (ref 30.0–36.0)
MCV: 90.8 fL (ref 80.0–100.0)
Platelets: 103 10*3/uL — ABNORMAL LOW (ref 150–400)
RBC: 3.14 MIL/uL — ABNORMAL LOW (ref 4.22–5.81)
RDW: 14.6 % (ref 11.5–15.5)
WBC: 5.1 10*3/uL (ref 4.0–10.5)
nRBC: 0 % (ref 0.0–0.2)

## 2022-12-31 MED ORDER — LIDOCAINE HCL (PF) 1 % IJ SOLN: 5.0000 mL | INTRAMUSCULAR | Status: DC | PRN

## 2022-12-31 MED ORDER — ALTEPLASE 2 MG IJ SOLR: 2.0000 mg | Freq: Once | INTRAMUSCULAR | Status: DC | PRN

## 2022-12-31 MED ORDER — LIDOCAINE-PRILOCAINE 2.5-2.5 % EX CREA: 1.0000 | TOPICAL_CREAM | CUTANEOUS | Status: DC | PRN

## 2022-12-31 MED ORDER — HEPARIN SODIUM (PORCINE) 1000 UNIT/ML DIALYSIS: 1000.0000 [IU] | INTRAMUSCULAR | Status: DC | PRN

## 2022-12-31 MED ORDER — PENTAFLUOROPROP-TETRAFLUOROETH EX AERO: 1.0000 | INHALATION_SPRAY | CUTANEOUS | Status: DC | PRN

## 2022-12-31 MED ORDER — ANTICOAGULANT SODIUM CITRATE 4% (200MG/5ML) IV SOLN: 5.0000 mL | Status: DC | PRN

## 2022-12-31 MED ORDER — CHLORHEXIDINE GLUCONATE CLOTH 2 % EX PADS: 6.0000 | MEDICATED_PAD | Freq: Every day | CUTANEOUS | Status: DC

## 2022-12-31 MED ORDER — HEPARIN SODIUM (PORCINE) 1000 UNIT/ML DIALYSIS
20.0000 [IU]/kg | INTRAMUSCULAR | Status: DC | PRN
  Administered 2022-12-31: 2600 [IU] via INTRAVENOUS_CENTRAL
  Filled 2022-12-31 (×2): qty 3

## 2022-12-31 MED ORDER — OXYCODONE HCL 5 MG PO TABS: 5.0000 mg | ORAL_TABLET | Freq: Three times a day (TID) | ORAL | 0 refills | Status: DC | PRN

## 2022-12-31 NOTE — TOC Transition Note (Signed)
Transition of Care [facility]) - CM/SW Discharge Note   Patient Details  Name: ZEKIAH CARUTH MRN: [id number] Date of Birth: 1961/04/27  Transition of Care [facility]) CM/SW Contact:  Tom-Johnson, Renea Ee, RN Phone Number: 12/31/2022, 2:02 PM   Clinical Narrative:     Patient is scheduled for discharge today. Denies any needs. Family to transport at discharge. No further TOC needs noted.        Final next level of care: Home/Self Care Barriers to Discharge: Barriers Resolved   Patient Goals and CMS Choice CMS Medicare.gov Compare Post Acute Care list provided to:: Patient Choice offered to / list presented to : NA  Discharge Placement                  Patient to be transferred to facility by: Family      Discharge Plan and Services Additional resources added to the After Visit Summary for     Discharge Planning Services: CM Consult Post Acute Care Choice: NA          DME Arranged: N/A DME Agency: NA       HH Arranged: NA HH Agency: NA        Social Determinants of Health (SDOH) Interventions [place]: No Food Insecurity (11/24/2022)  Housing: Low Risk  (11/24/2022)  Transportation Needs: No Transportation Needs (11/24/2022)  Utilities: Not At Risk (11/24/2022)  Tobacco Use: Medium Risk (12/29/2022)     Readmission Risk Interventions    11/03/2022   10:19 AM 04/07/2021    3:59 PM  Readmission Risk Prevention Plan  Transportation Screening Complete Complete  Medication Review ([place]) Complete Referral to Pharmacy  PCP or Specialist appointment within 3-5 days of discharge  Complete  HRI or Home Care Consult Complete Complete  SW Recovery Care/Counseling Consult Complete Complete  Palliative Care Screening Not Applicable Not [place] Not Applicable Complete

## 2022-12-31 NOTE — Discharge Instructions (Signed)
Continue to present to the ED on Tuesday/Thursday/Saturday's for continued hemodialysis given you do not have a outpatient dialysis unit to go to.  This was recommendations from nephrology.

## 2022-12-31 NOTE — Progress Notes (Addendum)
DISCHARGE NOTE HOME [name] to be discharged Home per MD order. Discussed prescriptions and follow up appointments with the patient. Prescriptions given to patient; medication list explained in detail. Patient verbalized understanding. Patient's belongings including his clothing and electronics given/returned to patient.  Skin clean, dry and intact without evidence of skin break down, no evidence of skin tears noted. IV catheter discontinued intact. Site without signs and symptoms of complications. Dressing and pressure applied. Pt denies pain at the site currently. No complaints noted.  Patient free of lines, drains, and wounds.   An After Visit Summary (AVS) was printed and given to the patient. Patient escorted via wheelchair, and discharged home via private auto.  Sharmon Revere, RN

## 2022-12-31 NOTE — Discharge Summary (Signed)
Physician Discharge Summary  Jeffrey [name] YKD:[id number] DOB: [birth year] DOA: 12/29/2022  PCP: Kristie Cowman, MD  Admit date: 12/29/2022 Discharge date: 12/31/2022  Admitted From: Home Disposition: Home  Recommendations for Outpatient Follow-up:  Follow up with PCP in 1-2 weeks Continue to present to the ED for hemodialysis per nephrology on Tuesday/Thursday/Saturdays given patient has been discharged from outpatient HD unit is currently  Home Health: No Equipment/Devices: None  Discharge Condition: Stable CODE STATUS: Full code Diet recommendation: Renal diet with 1200 mL fluid restriction  History of present illness:  Jeffrey [name] is a 62 y.o. male with past medical history significant for DM2, HLD, ESRD on HD, COPD, chronic diastolic congestive heart failure, GERD, peripheral neuropathy, PAD s/p left AKA, depression/PTSD, mood disorder, OSA on CPAP, morbid obesity, chronic pain syndrome who presented to [facility] ED on 2/1 with shortness of breath and chest pain.  Patient reports ongoing shortness of breath and feels like he needs dialysis.  Patient has been getting dialysis intermittently through the ED as he has been discharged from his home dialysis center for behavioral issues.  His last dialysis session was on 1/30; and prior to that 1/25.  Patient on 3 L chronic supplemental oxygen.  Denies fever, no chills, no abdominal pain, no nausea/vomiting/diarrhea.   In the ED, temperature 98.5 F, HR 92, RR 19, BP 156/49, SpO2 97% on 3 L nasal cannula.  WBC 7.0, hemoglobin 8.7, platelets 102.  Sodium 137, potassium 3.7, chloride 99, CO2 26, glucose 238, BUN 60, creatinine 3.86.  AST 12, ALT 13, total bilirubin 0.6.  High sensitive troponin 13.  Cova-19 PCR/influenza A/B/RSV PCR negative.  Chest x-ray with cardiomegaly with pulmonary vascular congestion, interstitial prominence bilaterally possible edema versus infiltrate.  Nephrology was consulted and patient underwent hemodialysis while boarding in  the ED.  Following dialysis patient continued with dyspnea and nephrology recommended [place] admission for continued hemodialysis the following day.  Hospitalist service consulted for admission.  Hospital course:  Volume overload ESRD on HD, intermittent Patient presenting to ED for continued hemodialysis, no longer dialyzes outpatient as he was discharged from his HD clinic due to 8 behavioral issues.  He gets intermittent dialysis on presentation to the ED, last on 1/30 and previously on 1/25.  Patient is currently stable on his 3 L chronic nasal cannula but chest x-ray on admission notable for vascular congestion consistent with volume overload.  Etiology to his presentation related to his insufficient frequency of receiving hemodialysis.  Per nephrology notes to receive HD in the ED on a TTS schedule.  Nephrology was consulted and patient underwent hemodialysis while inpatient for 3 sessions.  Continue home torsemide and metolazone.   Type 2 diabetes mellitus, with hyperglycemia Home regimen includes Humulin R U500 140 units Green BID.  Last hemoglobin A1c 10.5 on 11/25/2022, poorly controlled.  Outpatient follow-up with PCP.   Hyperlipidemia Atorvastatin 40 mg p.o. daily   Chronic diastolic congestive heart failure Essential hypertension Continue home amlodipine 10 mg p.o. daily, Torsemide 120 mg p.o. twice daily, Metolazone 10 mg p.o. daily. Continue aspirin and statin. Volume management with HD. Renal diet with 1200 mL fluid restriction  Depression/PTSD/mood disorder Sertraline 100 mg p.o. daily, Nortriptyline 20 mg p.o. nightly   OSA Continue nocturnal CPAP   Chronic pain syndrome Lyrica 75 mg p.o. on Tuesday/Thursday/Saturday   Peripheral vascular disease s/p left AKA Continue aspirin/Plavix/statin   BPH: Continue tamsulosin   GERD: Protonix 40 mg p.o. daily   Morbid obesity Body mass index is  48.61 kg/m.  Discussed with patient needs for aggressive lifestyle changes/weight  loss as this complicates all facets of care.  Outpatient follow-up with PCP.    Discharge Diagnoses:  Principal Problem:   Volume overload Active Problems:   End stage renal disease (HCC)   COPD (chronic obstructive pulmonary disease) (HCC)   Type 2 diabetes mellitus with hyperlipidemia (HCC)   PTSD (post-traumatic stress disorder)   Mood disorder (HCC)   Chronic diastolic heart failure (HCC)   Chronic midline low back pain without sciatica   Class 3 severe obesity due to excess calories with serious comorbidity and body mass index (BMI) of 45.0 to 49.9 in adult [facility])   Diabetic polyneuropathy associated with type 2 diabetes mellitus (HCC)   Gastroesophageal reflux disease without esophagitis   Iron deficiency anemia, unspecified   Mild episode of recurrent major depressive disorder (HCC)   Mixed hyperlipidemia   OSA on CPAP   S/P AKA (above knee amputation), left [facility])    Discharge Instructions  Discharge Instructions     Call MD for:  difficulty breathing, headache or visual disturbances   Complete by: As directed    Call MD for:  extreme fatigue   Complete by: As directed    Call MD for:  persistant dizziness or light-headedness   Complete by: As directed    Call MD for:  persistant nausea and vomiting   Complete by: As directed    Call MD for:  severe uncontrolled pain   Complete by: As directed    Call MD for:  temperature >100.4   Complete by: As directed    Diet - low sodium heart healthy   Complete by: As directed    Increase activity slowly   Complete by: As directed    No wound care   Complete by: As directed       Allergies as of 12/31/2022       Reactions   Actos [pioglitazone] Anaphylaxis, Rash   Dexmedetomidine Nausea And Vomiting   (Precedex) Dose-limiting bradycardia   Ibuprofen Shortness Of Breath, Swelling   Pt tolerates aspirin   Tomato Anaphylaxis   Only allergic to RAW tomatoes   Wellbutrin [bupropion] Swelling        Medication List      TAKE these medications    amLODipine 10 MG tablet Commonly known as: NORVASC Take 10 mg by mouth every morning.   Aspirin Low Dose 81 MG tablet Generic drug: aspirin EC Take 81 mg by mouth in the morning.   atorvastatin 40 MG tablet Commonly known as: LIPITOR Take 40 mg by mouth in the morning.   calcium acetate 667 MG capsule Commonly known as: PHOSLO Take 2 capsules (1,334 mg total) by mouth with breakfast, with lunch, and with evening meal.   carvedilol 12.5 MG tablet Commonly known as: COREG Take 12.5 mg by mouth 2 (two) times daily with a meal.   clopidogrel 75 MG tablet Commonly known as: PLAVIX Take 1 tablet (75 mg total) by mouth daily.   cyclobenzaprine 10 MG tablet Commonly known as: FLEXERIL Take 10 mg by mouth 3 (three) times daily as needed for muscle spasms.   diazepam 5 MG tablet Commonly known as: VALIUM Take 5 mg by mouth 2 (two) times daily as needed for muscle spasms.   diphenhydrAMINE 25 MG tablet Commonly known as: BENADRYL Take 50 mg by mouth every 6 (six) hours as needed for itching.   HumuLIN R U-500 KwikPen 500 UNIT/ML KwikPen Generic drug: insulin  regular human CONCENTRATED Inject 45 Units into the skin daily with supper. What changed:  how much to take when to take this   metolazone 10 MG tablet Commonly known as: ZAROXOLYN Take 10 mg by mouth daily.   nortriptyline 10 MG capsule Commonly known as: PAMELOR Take 20 mg by mouth at bedtime.   oxyCODONE 5 MG immediate release tablet Commonly known as: Roxicodone Take 1 tablet (5 mg total) by mouth every 8 (eight) hours as needed.   oxymetazoline 0.05 % nasal spray Commonly known as: AFRIN Place 1 spray into both nostrils daily as needed (nose bleeds).   pantoprazole 40 MG tablet Commonly known as: PROTONIX Take 40 mg by mouth daily before breakfast.   pregabalin 75 MG capsule Commonly known as: LYRICA Take 75 mg by mouth See admin instructions. Daily.  Give after  dialysis on dialysis days.   sertraline 100 MG tablet Commonly known as: ZOLOFT Take 100 mg by mouth in the morning.   sodium chloride 0.65 % Soln nasal spray Commonly known as: OCEAN Place 2 sprays into both nostrils in the morning and at bedtime.   tadalafil 20 MG tablet Commonly known as: CIALIS Take 20 mg by mouth daily as needed for erectile dysfunction.   tamsulosin 0.4 MG Caps capsule Commonly known as: FLOMAX Take 1 capsule (0.4 mg total) by mouth daily.   torsemide 20 MG tablet Commonly known as: DEMADEX Take 120 mg by mouth 2 (two) times daily.   traZODone 150 MG tablet Commonly known as: DESYREL Take 150 mg by mouth at bedtime.   Vitamin D 50 MCG (2000 UT) Caps Take 2,000 Units by mouth daily.        Follow-up Information     Kristie Cowman, MD. Schedule an appointment as soon as possible for a visit.   Specialty: Family Medicine Contact information: [place] [address] [phone]                Allergies  Allergen Reactions   Actos [Pioglitazone] Anaphylaxis and Rash   Dexmedetomidine Nausea And Vomiting    (Precedex) Dose-limiting bradycardia     Ibuprofen Shortness Of Breath and Swelling    Pt tolerates aspirin    Tomato Anaphylaxis    Only allergic to RAW tomatoes   Wellbutrin [Bupropion] Swelling    Consultations: Nephrology   Procedures/Studies: DG Chest 2 View  Result Date: 12/29/2022 CLINICAL DATA:  Shortness of breath. EXAM: CHEST - 2 VIEW COMPARISON:  12/17/2022. FINDINGS: Heart is enlarged and the mediastinal contour is within normal limits. A vascular stent is noted in the suprahilar region on the right. The pulmonary vasculature is mildly distended. Interstitial prominence is present bilaterally. No consolidation, effusion, or pneumothorax. Right shoulder arthroplasty changes are noted. Posterior cervical spinal fusion hardware is present. No acute osseous abnormality. IMPRESSION: 1. Cardiomegaly with  pulmonary vascular congestion. 2. Interstitial prominence bilaterally, possible edema or infiltrate. Electronically Signed   By: Brett Fairy M.D.   On: 12/29/2022 04:50   DG Chest 2 View  Result Date: 12/17/2022 CLINICAL DATA:  Shortness of breath EXAM: CHEST - 2 VIEW COMPARISON:  Chest radiograph 11/25/2022 FINDINGS: Cardiac contours upper limits of normal. Mildly improved bilateral mid and lower lung heterogeneous opacities. Interval removal left IJ catheter. Stent projects over the superior mediastinum. No pleural effusion or pneumothorax. Right shoulder arthroplasty. IMPRESSION: Mildly improved bilateral mid and lower lung heterogeneous opacities. Electronically Signed   By: Lovey Newcomer M.D.   On: 12/17/2022 09:07  Subjective: Patient seen and examined at bedside, resting comfortably.  No specific complaints this morning, watching TV.  Plan for repeat HD today per nephrology and okay for discharge home thereafter.  Discussed with patient needs to comply with outpatient dialysis and present to the ED on Tuesday/Thursday/Saturdays for continue HD as he has been discharged from all outpatient HD units per nephrology.  No other specific complaints or concerns at this time.  Denies headache, no dizziness, no chest pain, no shortness of breath more than his typical baseline, no abdominal pain, no fever/chills/night sweats, no nausea/vomiting/diarrhea, no focal weakness, no fatigue, no paresthesias.  No acute events overnight per nursing staff.  Discharge Exam: Vitals:   12/31/22 0522 12/31/22 0907  BP: 137/68 132/60  Pulse: 78 88  Resp: 16 18  Temp: 97.6 F (36.4 C) 98 F (36.7 C)  SpO2: 100% 100%   Vitals:   12/30/22 1645 12/30/22 2059 12/31/22 0522 12/31/22 0907  BP: (!) 141/67 138/67 137/68 132/60  Pulse: 81 81 78 88  Resp: 17 18 16 18   Temp: 98 F (36.7 C) 98.4 F (36.9 C) 97.6 F (36.4 C) 98 F (36.7 C)  TempSrc:  Oral Oral Oral  SpO2: 99% 100% 100% 100%  Weight:   130.4 kg    Height:        Physical Exam: GEN: NAD, alert and oriented x 3, obese, chronically ill appearance, appears older than stated age HEENT: NCAT, PERRL, EOMI, sclera clear, MMM PULM: CTAB w/o wheezes/crackles, normal respiratory effort, on 3 L [place], which is his baseline with SpO2 100% at rest CV: RRR w/o M/G/R GI: abd soft, NTND, NABS, no R/G/M MSK: Noted left AKA NEURO: No focal deficits PSYCH: normal mood/affect Integumentary: dry/intact, no rashes or wounds    The results of significant diagnostics from this hospitalization (including imaging, microbiology, ancillary and laboratory) are listed below for reference.     Microbiology: Recent Results (from the past 240 hour(s))  Resp panel by RT-PCR (RSV, Flu A&B, Covid) Anterior Nasal Swab     Status: None   Collection Time: 12/29/22  4:17 AM   Specimen: Anterior Nasal Swab  Result Value Ref Range Status   SARS Coronavirus 2 by RT PCR NEGATIVE NEGATIVE Final   Influenza A by PCR NEGATIVE NEGATIVE Final   Influenza B by PCR NEGATIVE NEGATIVE Final    Comment: (NOTE) The Xpert Xpress SARS-CoV-2/FLU/RSV plus assay is intended as an aid in the diagnosis of influenza from Nasopharyngeal swab specimens and should not be used as a sole basis for treatment. Nasal washings and aspirates are unacceptable for Xpert Xpress SARS-CoV-2/FLU/RSV testing.  Fact Sheet for Patients: EntrepreneurPulse.com.au  Fact Sheet for Healthcare Providers: IncredibleEmployment.be  This test is not yet approved or cleared by the [place] FDA and has been authorized for detection and/or diagnosis of SARS-CoV-2 by FDA under an Emergency Use Authorization (EUA). This EUA will remain in effect (meaning this test can be used) for the duration of the COVID-19 declaration under Section 564(b)(1) of the Act, 21 U.S.C. section 360bbb-3(b)(1), unless the authorization is terminated or revoked.     Resp Syncytial  Virus by PCR NEGATIVE NEGATIVE Final    Comment: (NOTE) Fact Sheet for Patients: EntrepreneurPulse.com.au  Fact Sheet for Healthcare Providers: IncredibleEmployment.be  This test is not yet approved or cleared by the [place] FDA and has been authorized for detection and/or diagnosis of SARS-CoV-2 by FDA under an Emergency Use Authorization (EUA). This EUA will remain in effect (meaning this  test can be used) for the duration of the COVID-19 declaration under Section 564(b)(1) of the Act, 21 U.S.C. section 360bbb-3(b)(1), unless the authorization is terminated or revoked.  Performed at [facility], [place] [address] [place], [place] [address]      Labs: BNP (last 3 results) No results for input(s): "BNP" in the last 8760 hours. Basic Metabolic Panel: Recent Labs  Lab 12/27/22 0838 12/29/22 0501 12/29/22 1709 12/30/22 0800  NA 141 137  --  134*  K 3.7 3.7  --  4.0  CL 100 99  --  97*  CO2 28 26  --  28  GLUCOSE 169* 238*  --  301*  BUN 76* 60*  --  45*  CREATININE 4.14* 3.86* 2.85* 3.38*  CALCIUM 7.8* 7.8*  --  8.2*   Liver Function Tests: Recent Labs  Lab 12/29/22 0501 12/30/22 0800  AST 12* 11*  ALT 13 13  ALKPHOS 75 72  BILITOT 0.6 0.3  PROT 7.0 7.0  ALBUMIN 3.4* 3.3*   No results for input(s): "LIPASE", "AMYLASE" in the last 168 hours. No results for input(s): "AMMONIA" in the last 168 hours. CBC: Recent Labs  Lab 12/27/22 0838 12/29/22 0501 12/29/22 1709 12/30/22 0800  WBC 6.6 7.0 6.1 5.4  NEUTROABS  --  5.7  --   --   HGB 8.7* 8.7* 8.8* 8.7*  HCT 28.9* 27.5* 27.8* 27.9*  MCV 92.6 90.5 90.8 90.3  PLT 97* 102* 85* 106*   Cardiac Enzymes: No results for input(s): "CKTOTAL", "CKMB", "CKMBINDEX", "TROPONINI" in the last 168 hours. BNP: Invalid input(s): "POCBNP" CBG: Recent Labs  Lab 12/30/22 1645 12/30/22 2102 12/31/22 0528 12/31/22 0731 12/31/22 1111  GLUCAP 299* 289* 327* 291* 293*    D-Dimer No results for input(s): "DDIMER" in the last 72 hours. Hgb A1c No results for input(s): "HGBA1C" in the last 72 hours. Lipid Profile No results for input(s): "CHOL", "HDL", "LDLCALC", "TRIG", "CHOLHDL", "LDLDIRECT" in the last 72 hours. Thyroid function studies No results for input(s): "TSH", "T4TOTAL", "T3FREE", "THYROIDAB" in the last 72 hours.  Invalid input(s): "FREET3" Anemia work up No results for input(s): "VITAMINB12", "FOLATE", "FERRITIN", "TIBC", "IRON", "RETICCTPCT" in the last 72 hours. Urinalysis    Component Value Date/Time   COLORURINE STRAW (A) 11/07/2021 0033   APPEARANCEUR CLEAR 11/07/2021 0033   LABSPEC 1.006 11/07/2021 0033   PHURINE 5.0 11/07/2021 0033   GLUCOSEU NEGATIVE 11/07/2021 0033   HGBUR NEGATIVE 11/07/2021 0033   BILIRUBINUR NEGATIVE 11/07/2021 0033   KETONESUR NEGATIVE 11/07/2021 0033   PROTEINUR NEGATIVE 11/07/2021 0033   NITRITE NEGATIVE 11/07/2021 0033   LEUKOCYTESUR NEGATIVE 11/07/2021 0033   Sepsis Labs Recent Labs  Lab 12/27/22 0838 12/29/22 0501 12/29/22 1709 12/30/22 0800  WBC 6.6 7.0 6.1 5.4   Microbiology Recent Results (from the past 240 hour(s))  Resp panel by RT-PCR (RSV, Flu A&B, Covid) Anterior Nasal Swab     Status: None   Collection Time: 12/29/22  4:17 AM   Specimen: Anterior Nasal Swab  Result Value Ref Range Status   SARS Coronavirus 2 by RT PCR NEGATIVE NEGATIVE Final   Influenza A by PCR NEGATIVE NEGATIVE Final   Influenza B by PCR NEGATIVE NEGATIVE Final    Comment: (NOTE) The Xpert Xpress SARS-CoV-2/FLU/RSV plus assay is intended as an aid in the diagnosis of influenza from Nasopharyngeal swab specimens and should not be used as a sole basis for treatment. Nasal washings and aspirates are unacceptable for Xpert Xpress SARS-CoV-2/FLU/RSV testing.  Fact  Sheet for Patients: EntrepreneurPulse.com.au  Fact Sheet for Healthcare  Providers: IncredibleEmployment.be  This test is not yet approved or cleared by the [place] FDA and has been authorized for detection and/or diagnosis of SARS-CoV-2 by FDA under an Emergency Use Authorization (EUA). This EUA will remain in effect (meaning this test can be used) for the duration of the COVID-19 declaration under Section 564(b)(1) of the Act, 21 U.S.C. section 360bbb-3(b)(1), unless the authorization is terminated or revoked.     Resp Syncytial Virus by PCR NEGATIVE NEGATIVE Final    Comment: (NOTE) Fact Sheet for Patients: EntrepreneurPulse.com.au  Fact Sheet for Healthcare Providers: IncredibleEmployment.be  This test is not yet approved or cleared by the [place] FDA and has been authorized for detection and/or diagnosis of SARS-CoV-2 by FDA under an Emergency Use Authorization (EUA). This EUA will remain in effect (meaning this test can be used) for the duration of the COVID-19 declaration under Section 564(b)(1) of the Act, 21 U.S.C. section 360bbb-3(b)(1), unless the authorization is terminated or revoked.  Performed at [facility], [place] [address] [place], [place] [address]      Time coordinating discharge: Over 30 minutes  SIGNED:   Stepheny Canal J [place] DO  [facility] 12/31/2022, 12:52 PM

## 2022-12-31 NOTE — Progress Notes (Signed)
Received patient in bed to unit.  Alert and oriented.  Informed consent signed and in chart.   TX duration:  Patient tolerated well.  Transported back to the room  Alert, without acute distress.  Hand-off given to patient's nurse.   Access used: AVF Access issues: none  Total UF removed: 4 L Medication(s) given: none Post HD VS: 133/72 P 88 R 16 O2 sat 100 % in 2 L O2 using. Post HD weight: 128.6 kg   [facility]

## 2022-12-31 NOTE — Progress Notes (Signed)
Subjective: Seen in room , not in distress, still has volume on despite yesterday 4 L UF HD .[facility] Kitchenfor HD today, noted refused CPAP last night.  For probable discharge after dialysis today  Objective Vital signs in last 24 hours: Vitals:   12/30/22 1645 12/30/22 2059 12/31/22 0522 12/31/22 0907  BP: (!) 141/67 138/67 137/68 132/60  Pulse: 81 81 78 88  Resp: 17 18 16 18   Temp: 98 F (36.7 C) 98.4 F (36.9 C) 97.6 F (36.4 C) 98 F (36.7 C)  TempSrc:  Oral Oral Oral  SpO2: 99% 100% 100% 100%  Weight:   130.4 kg   Height:       Weight change: 6.525 kg  Physical Exam: General: Chronically ill obese male not in distress nasal cannula oxygen in place Heart: RRR no MRG Lungs: CTA anteriorly nonlabored breathing nasal cannula oxygen Abdomen: Obese, soft NABS NTND Extremities: Trace right lower extremity edema, left AKA stump stable Dialysis Access: R UA AV fistula positive bruit  Last Dialysis Orders: 3:15hr, 129kg, RUE AVG, UFP#4, heparin 2000unit bolus   Assessment/Plan: ESRD: He is in need of dialysis -> HD again today.  We may need to start doing 4-hour HD treatments as he currently has no outpatient unit HTN/volume: BP stable, he does have some edema on admit CXR. UF as tolerated.  Anemia of ESRD: Hgb 8.7 Aranesp 160mcg given 1/30 - re-dose next week. Productive cough: Flu/COVID negative. No HCAP. Likely fluid.   Ernest Haber, PA-C [facility] [phone] 12/31/2022,11:07 AM  LOS: 1 day   Labs: Basic Metabolic Panel: Recent Labs  Lab 12/27/22 0838 12/29/22 0501 12/29/22 1709 12/30/22 0800  NA 141 137  --  134*  K 3.7 3.7  --  4.0  CL 100 99  --  97*  CO2 28 26  --  28  GLUCOSE 169* 238*  --  301*  BUN 76* 60*  --  45*  CREATININE 4.14* 3.86* 2.85* 3.38*  CALCIUM 7.8* 7.8*  --  8.2*   Liver Function Tests: Recent Labs  Lab 12/29/22 0501 12/30/22 0800  AST 12* 11*  ALT 13 13  ALKPHOS 75 72  BILITOT 0.6 0.3  PROT 7.0 7.0  ALBUMIN 3.4*  3.3*   No results for input(s): "LIPASE", "AMYLASE" in the last 168 hours. No results for input(s): "AMMONIA" in the last 168 hours. CBC: Recent Labs  Lab 12/27/22 0838 12/29/22 0501 12/29/22 1709 12/30/22 0800  WBC 6.6 7.0 6.1 5.4  NEUTROABS  --  5.7  --   --   HGB 8.7* 8.7* 8.8* 8.7*  HCT 28.9* 27.5* 27.8* 27.9*  MCV 92.6 90.5 90.8 90.3  PLT 97* 102* 85* 106*   Cardiac Enzymes: No results for input(s): "CKTOTAL", "CKMB", "CKMBINDEX", "TROPONINI" in the last 168 hours. CBG: Recent Labs  Lab 12/30/22 1349 12/30/22 1645 12/30/22 2102 12/31/22 0528 12/31/22 0731  GLUCAP 249* 299* 289* 327* 291*    Studies/Results: No results found. Medications:   amLODipine  10 mg Oral q morning   aspirin EC  81 mg Oral q AM   atorvastatin  40 mg Oral q AM   calcium acetate  1,334 mg Oral TID with meals   carvedilol  12.5 mg Oral BID WC   Chlorhexidine Gluconate Cloth  6 each Topical Q0600   clopidogrel  75 mg Oral Daily   heparin  5,000 Units Subcutaneous Q8H   insulin aspart  0-20 Units Subcutaneous TID WC   insulin glargine-yfgn  60 Units  Subcutaneous BID   metolazone  10 mg Oral Daily   nortriptyline  20 mg Oral QHS   pantoprazole  40 mg Oral QAC breakfast   pregabalin  75 mg Oral Q T,Th,Sa-HD   sertraline  100 mg Oral q AM   sodium chloride flush  3 mL Intravenous Q12H   tamsulosin  0.4 mg Oral Daily   torsemide  120 mg Oral BID   traZODone  150 mg Oral QHS

## 2023-01-03 ENCOUNTER — Other Ambulatory Visit: Payer: Self-pay

## 2023-01-03 ENCOUNTER — Emergency Department (HOSPITAL_COMMUNITY)
Admission: EM | Admit: 2023-01-03 | Discharge: 2023-01-03 | Disposition: A | Discharge status: Home / Self Care | Payer: 59 | Attending: Emergency Medicine | Admitting: Emergency Medicine

## 2023-01-03 ENCOUNTER — Encounter (HOSPITAL_COMMUNITY): Payer: Self-pay

## 2023-01-03 DIAGNOSIS — Z992 Dependence on renal dialysis: Secondary | ICD-10-CM | POA: Diagnosis present

## 2023-01-03 DIAGNOSIS — Z8616 Personal history of COVID-19: Secondary | ICD-10-CM | POA: Insufficient documentation

## 2023-01-03 DIAGNOSIS — Z794 Long term (current) use of insulin: Secondary | ICD-10-CM | POA: Diagnosis not present

## 2023-01-03 DIAGNOSIS — I132 Hypertensive heart and chronic kidney disease with heart failure and with stage 5 chronic kidney disease, or end stage renal disease: Secondary | ICD-10-CM | POA: Insufficient documentation

## 2023-01-03 DIAGNOSIS — Z79899 Other long term (current) drug therapy: Secondary | ICD-10-CM | POA: Diagnosis not present

## 2023-01-03 DIAGNOSIS — Z7982 Long term (current) use of aspirin: Secondary | ICD-10-CM | POA: Diagnosis not present

## 2023-01-03 DIAGNOSIS — N186 End stage renal disease: Secondary | ICD-10-CM | POA: Diagnosis not present

## 2023-01-03 DIAGNOSIS — E1122 Type 2 diabetes mellitus with diabetic chronic kidney disease: Secondary | ICD-10-CM | POA: Diagnosis not present

## 2023-01-03 DIAGNOSIS — I509 Heart failure, unspecified: Secondary | ICD-10-CM | POA: Diagnosis not present

## 2023-01-03 DIAGNOSIS — J449 Chronic obstructive pulmonary disease, unspecified: Secondary | ICD-10-CM | POA: Insufficient documentation

## 2023-01-03 DIAGNOSIS — Z7901 Long term (current) use of anticoagulants: Secondary | ICD-10-CM | POA: Insufficient documentation

## 2023-01-03 LAB — BASIC METABOLIC PANEL
Anion gap: 13 (ref 5–15)
BUN: 64 mg/dL — ABNORMAL HIGH (ref 8–23)
CO2: 27 mmol/L (ref 22–32)
Calcium: 8.9 mg/dL (ref 8.9–10.3)
Chloride: 94 mmol/L — ABNORMAL LOW (ref 98–111)
Creatinine, Ser: 5.46 mg/dL — ABNORMAL HIGH (ref 0.61–1.24)
GFR, Estimated: 11 mL/min — ABNORMAL LOW (ref >60)
Glucose, Bld: 255 mg/dL — ABNORMAL HIGH (ref 70–99)
Potassium: 3.9 mmol/L (ref 3.5–5.1)
Sodium: 134 mmol/L — ABNORMAL LOW (ref 135–145)

## 2023-01-03 LAB — CBC WITH DIFFERENTIAL/PLATELET
Abs Immature Granulocytes: 0.07 10*3/uL (ref 0.00–0.07)
Basophils Absolute: 0 10*3/uL (ref 0.0–0.1)
Basophils Relative: 0 %
Eosinophils Absolute: 0.2 10*3/uL (ref 0.0–0.5)
Eosinophils Relative: 3 %
HCT: 30.4 % — ABNORMAL LOW (ref 39.0–52.0)
Hemoglobin: 9.7 g/dL — ABNORMAL LOW (ref 13.0–17.0)
Immature Granulocytes: 1 %
Lymphocytes Relative: 11 %
Lymphs Abs: 0.7 10*3/uL (ref 0.7–4.0)
MCH: 28.5 pg (ref 26.0–34.0)
MCHC: 31.9 g/dL (ref 30.0–36.0)
MCV: 89.4 fL (ref 80.0–100.0)
Monocytes Absolute: 0.6 10*3/uL (ref 0.1–1.0)
Monocytes Relative: 8 %
Neutro Abs: 5 10*3/uL (ref 1.7–7.7)
Neutrophils Relative %: 77 %
Platelets: 117 10*3/uL — ABNORMAL LOW (ref 150–400)
RBC: 3.4 MIL/uL — ABNORMAL LOW (ref 4.22–5.81)
RDW: 14.6 % (ref 11.5–15.5)
WBC: 6.5 10*3/uL (ref 4.0–10.5)
nRBC: 0 % (ref 0.0–0.2)

## 2023-01-03 MED ORDER — CHLORHEXIDINE GLUCONATE CLOTH 2 % EX PADS: 6.0000 | MEDICATED_PAD | Freq: Every day | CUTANEOUS | Status: DC

## 2023-01-03 MED ORDER — HEPARIN SODIUM (PORCINE) 1000 UNIT/ML IJ SOLN
INTRAMUSCULAR | Status: AC
  Administered 2023-01-03: 2000 [IU]
  Filled 2023-01-03: qty 2

## 2023-01-03 NOTE — Progress Notes (Signed)
Received patient in bed to unit.  Alert and oriented.  Informed consent signed and in chart.   Wilson duration:3.5  Patient tolerated well.  Transported back to the room  Alert, without acute distress.  Hand-off given to patient's nurse.   Access used: AVF Access issues: none  Total UF removed: 4L Medication(s) given: none Post HD VS: 98.1,86,14,101/67,100% Post HD weight: unable to weigh.   [facility]

## 2023-01-03 NOTE — ED Provider Notes (Signed)
[facility] Provider Note   CSN: [id number] Arrival date & time: 01/03/23  0800     History  Chief Complaint  Patient presents with   needs dialysis    YOVAN LEEMAN is a 62 y.o. male.  HPI Patient presents for dialysis need.  Medical history includes ESRD, COPD, T2DM, anemia, CHF, gastritis, pulmonary hypertension, arthritis, left AKA.  He has been on hemodialysis for several years, since a prolonged hospitalization for COVID-19.  He is on 3 L of supplemental oxygen at baseline.  He had a dialysis center up until 1 month ago.  He is currently working on obtaining a new dialysis center to his primary care doctor.  He was seen in the ED on 12/1 for dialysis need.  He underwent dialysis but subsequently had continued dyspnea.  He was admitted for further dialysis.  He received 3 further dialysis sessions while admitted.  He was discharged 3 days ago.  His last dialysis session was 3 days ago, the morning of his day of discharge.  Patient denies any new symptoms since his discharge from hospital.    Home Medications Prior to Admission medications   Medication Sig Start Date End Date Taking? Authorizing Provider  amLODipine (NORVASC) 10 MG tablet Take 10 mg by mouth every morning. 04/17/20   [provider]  ASPIRIN LOW DOSE 81 MG EC tablet Take 81 mg by mouth in the morning. 02/22/21   [provider]  atorvastatin (LIPITOR) 40 MG tablet Take 40 mg by mouth in the morning. 02/09/21   [provider]  calcium acetate (PHOSLO) 667 MG capsule Take 2 capsules (1,334 mg total) by mouth with breakfast, with lunch, and with evening meal. 04/10/21   Debbe Odea, MD  carvedilol (COREG) 12.5 MG tablet Take 12.5 mg by mouth 2 (two) times daily with a meal. 04/17/20   [provider]  Cholecalciferol (VITAMIN D) 50 MCG (2000 UT) CAPS Take 2,000 Units by mouth daily.    [provider]  clopidogrel (PLAVIX) 75 MG  tablet Take 1 tablet (75 mg total) by mouth daily. 11/02/22   Rhyne, Hulen Shouts, PA-C  cyclobenzaprine (FLEXERIL) 10 MG tablet Take 10 mg by mouth 3 (three) times daily as needed for muscle spasms. 12/29/21   [provider]  diazepam (VALIUM) 5 MG tablet Take 5 mg by mouth 2 (two) times daily as needed for muscle spasms. 12/09/21   [provider]  diphenhydrAMINE (BENADRYL) 25 MG tablet Take 50 mg by mouth every 6 (six) hours as needed for itching.    [provider]  insulin regular human CONCENTRATED (HUMULIN R U-500 KWIKPEN) 500 UNIT/ML KwikPen Inject 45 Units into the skin daily with supper. Patient taking differently: Inject 140 Units into the skin 2 (two) times daily with a meal. 09/04/21 08/30/24  [name], MD  metolazone (ZAROXOLYN) 10 MG tablet Take 10 mg by mouth daily. 07/13/21   [provider]  nortriptyline (PAMELOR) 10 MG capsule Take 20 mg by mouth at bedtime. 10/23/19 08/30/24  [provider]  oxyCODONE (ROXICODONE) 5 MG immediate release tablet Take 1 tablet (5 mg total) by mouth every 8 (eight) hours as needed. 12/31/22 12/31/23  [place] [name], DO  oxymetazoline (AFRIN) 0.05 % nasal spray Place 1 spray into both nostrils daily as needed (nose bleeds).    [provider]  pantoprazole (PROTONIX) 40 MG tablet Take 40 mg by mouth daily before breakfast. 02/09/21  [provider]  pregabalin (LYRICA) 75 MG capsule Take 75 mg by mouth See admin instructions. Daily.  Give after dialysis on dialysis days. 08/22/22   [provider]  sertraline (ZOLOFT) 100 MG tablet Take 100 mg by mouth in the morning. 02/22/21   [provider]  sodium chloride (OCEAN) 0.65 % SOLN nasal spray Place 2 sprays into both nostrils in the morning and at bedtime.    [provider]  tadalafil (CIALIS) 20 MG tablet Take 20 mg by mouth daily as needed for erectile dysfunction.    [provider]  tamsulosin  (FLOMAX) 0.4 MG CAPS capsule Take 1 capsule (0.4 mg total) by mouth daily. 11/10/21   Darliss Cheney, MD  torsemide (DEMADEX) 20 MG tablet Take 120 mg by mouth 2 (two) times daily.    [provider]  traZODone (DESYREL) 150 MG tablet Take 150 mg by mouth at bedtime. 12/09/21   [provider]      Allergies    Actos [pioglitazone], Dexmedetomidine, Ibuprofen, Tomato, and Wellbutrin [bupropion]    Review of Systems   Review of Systems  Respiratory:  Negative for chest tightness and shortness of breath.   Cardiovascular:  Negative for chest pain.  All other systems reviewed and are negative.   Physical Exam Updated Vital Signs BP 114/68   Pulse 84   Temp 98 F (36.7 C) (Oral)   Resp 16   Ht 5\' 5"  (1.651 m)   Wt 128.6 kg   SpO2 100%   BMI 47.18 kg/m  Physical Exam Vitals and nursing note reviewed.  Constitutional:      General: He is not in acute distress.    Appearance: Normal appearance. He is well-developed. He is not ill-appearing, toxic-appearing or diaphoretic.  HENT:     Head: Normocephalic and atraumatic.     Right Ear: External ear normal.     Left Ear: External ear normal.     Nose: Nose normal.     Mouth/Throat:     Mouth: Mucous membranes are moist.     Pharynx: Oropharynx is clear.  Eyes:     Extraocular Movements: Extraocular movements intact.     Conjunctiva/sclera: Conjunctivae normal.  Cardiovascular:     Rate and Rhythm: Normal rate and regular rhythm.     Heart sounds: No murmur heard. Pulmonary:     Effort: Pulmonary effort is normal. No respiratory distress.     Breath sounds: Normal breath sounds. No wheezing or rales.  Chest:     Chest wall: No tenderness.  Abdominal:     General: There is no distension.     Palpations: Abdomen is soft.     Tenderness: There is no abdominal tenderness.  Musculoskeletal:        General: No swelling. Normal range of motion.     Cervical back: Normal range of motion and neck supple.     Right  lower leg: Edema present.     Comments: Left AKA  Skin:    General: Skin is warm and dry.     Capillary Refill: Capillary refill takes less than 2 seconds.     Coloration: Skin is not jaundiced or pale.  Neurological:     General: No focal deficit present.     Mental Status: He is alert and oriented to person, place, and time.     Cranial Nerves: No cranial nerve deficit.     Sensory: No sensory deficit.     Motor: No weakness.  Coordination: Coordination normal.  Psychiatric:        Mood and Affect: Mood normal.        Behavior: Behavior normal.        Thought Content: Thought content normal.        Judgment: Judgment normal.     ED Results / Procedures / Treatments   Labs (all labs ordered are listed, but only abnormal results are displayed) Labs Reviewed  BASIC METABOLIC PANEL - Abnormal; Notable for the following components:      Result Value   Sodium 134 (*)    Chloride 94 (*)    Glucose, Bld 255 (*)    BUN 64 (*)    Creatinine, Ser 5.46 (*)    GFR, Estimated 11 (*)    All other components within normal limits  CBC WITH DIFFERENTIAL/PLATELET - Abnormal; Notable for the following components:   RBC 3.40 (*)    Hemoglobin 9.7 (*)    HCT 30.4 (*)    Platelets 117 (*)    All other components within normal limits    EKG None  Radiology No results found.  Procedures Procedures    Medications Ordered in ED Medications  Chlorhexidine Gluconate Cloth 2 % PADS 6 each (6 each Topical Not Given 01/03/23 1526)  heparin sodium (porcine) 1000 UNIT/ML injection (2,000 Units  Given 01/03/23 1110)    ED Course/ Medical Decision Making/ A&P                             Medical Decision Making Amount and/or Complexity of Data Reviewed Labs: ordered.   Patient presents for dialysis need.  He has been out of a dialysis center for the past month.  He most recently received dialysis 3 days ago, while admitted to the hospital.  He denies any new symptoms or complaints  since that time.  On exam, patient is on his baseline 3 L of supplemental oxygen.  At rest, his breathing is unlabored.  He does develop labored breathing with small amounts of exertion, which he states is baseline.  I spoke with nephrologist on-call, Dr. Soyla Murphy, who will arrange for dialysis today from the ED.  Lab work shows increased creatinine and BUN from baseline.  Currently potassium is normal.  Patient underwent dialysis and return to the ED.  On reassessment, patient is well-appearing and denies any new symptoms.  He was discharged in good condition.        Final Clinical Impression(s) / ED Diagnoses Final diagnoses:  Dialysis patient [facility])    Rx / DC Orders ED Discharge Orders     None         Godfrey Pick, MD 01/03/23 1717

## 2023-01-03 NOTE — ED Notes (Signed)
Pt to dialysis, pt has no other requests at this time.

## 2023-01-03 NOTE — ED Notes (Signed)
Pt sitting up in bed, pt states that he comes here for dialysis because he doesn't have a facility to go to, pt states that he gets dialysis Saturday, Tuesday and Thursday. Pt has no complaints, states that he is only here for dialysis.

## 2023-01-03 NOTE — Procedures (Addendum)
Asked to see patient for inpatient dialysis.  Pt was discharged from OP HD unit for behavioral issues. Shows up to ED today requesting HD here at the hospital. HD orders are in.    Last HD orders:  3:15hr, 129kg, RUE AVG, UFP#4, heparin 2000unit bolus   Kelly Splinter, MD 01/03/2023, 9:21 AM   Last Labs       Recent Labs  Lab 12/30/22 0800 12/31/22 1230 01/03/23 0826  HGB 8.7* 8.9* 9.7*  ALBUMIN 3.3* 3.2*  --   CALCIUM 8.2* 8.6* 8.9  PHOS  --  3.6  --   CREATININE 3.38* 3.57* 5.46*  K 4.0 3.5 3.9        Inpatient medications:  Chlorhexidine Gluconate Cloth  6 each Topical Q0600

## 2023-01-03 NOTE — ED Triage Notes (Signed)
Pt states he is here for dialysis. Pt states he doesn't have a dialysis ctr and he and [facility] are working on getting him a ctr. Pt gets dialysis Tues, Thurs, Sat. Pt denies any complaints at this time

## 2023-01-03 NOTE — ED Provider Notes (Signed)
Pt initially seen by Dr Doren Custard earlier today.  Pt ended up getting dialysis here at the hospital.  Pt was discharged from his outpatient dialysis center and can no longer receive treatment there so he had to come to the hospital.  Pt denies complaints at this time.  Ready for discharge.   [name], MD 01/03/23 1537

## 2023-01-03 NOTE — Discharge Instructions (Addendum)
Follow-up as recommended by the dialysis team 

## 2023-01-05 ENCOUNTER — Emergency Department (HOSPITAL_COMMUNITY)
Admission: EM | Admit: 2023-01-05 | Discharge: 2023-01-05 | Disposition: A | Discharge status: Home / Self Care | Payer: 59 | Attending: Emergency Medicine | Admitting: Emergency Medicine

## 2023-01-05 ENCOUNTER — Encounter (HOSPITAL_COMMUNITY): Payer: Self-pay | Admitting: Pharmacy Technician

## 2023-01-05 ENCOUNTER — Other Ambulatory Visit: Payer: Self-pay

## 2023-01-05 DIAGNOSIS — Z7982 Long term (current) use of aspirin: Secondary | ICD-10-CM | POA: Diagnosis not present

## 2023-01-05 DIAGNOSIS — Z7902 Long term (current) use of antithrombotics/antiplatelets: Secondary | ICD-10-CM | POA: Diagnosis not present

## 2023-01-05 DIAGNOSIS — N186 End stage renal disease: Secondary | ICD-10-CM | POA: Diagnosis present

## 2023-01-05 DIAGNOSIS — Z794 Long term (current) use of insulin: Secondary | ICD-10-CM | POA: Insufficient documentation

## 2023-01-05 DIAGNOSIS — Z992 Dependence on renal dialysis: Secondary | ICD-10-CM | POA: Insufficient documentation

## 2023-01-05 LAB — CBC
HCT: 30 % — ABNORMAL LOW (ref 39.0–52.0)
Hemoglobin: 9.5 g/dL — ABNORMAL LOW (ref 13.0–17.0)
MCH: 28.4 pg (ref 26.0–34.0)
MCHC: 31.7 g/dL (ref 30.0–36.0)
MCV: 89.8 fL (ref 80.0–100.0)
Platelets: 136 10*3/uL — ABNORMAL LOW (ref 150–400)
RBC: 3.34 MIL/uL — ABNORMAL LOW (ref 4.22–5.81)
RDW: 14.6 % (ref 11.5–15.5)
WBC: 7.2 10*3/uL (ref 4.0–10.5)
nRBC: 0 % (ref 0.0–0.2)

## 2023-01-05 LAB — RENAL FUNCTION PANEL
Albumin: 3.6 g/dL (ref 3.5–5.0)
Anion gap: 11 (ref 5–15)
BUN: 64 mg/dL — ABNORMAL HIGH (ref 8–23)
CO2: 29 mmol/L (ref 22–32)
Calcium: 8.5 mg/dL — ABNORMAL LOW (ref 8.9–10.3)
Chloride: 93 mmol/L — ABNORMAL LOW (ref 98–111)
Creatinine, Ser: 5.7 mg/dL — ABNORMAL HIGH (ref 0.61–1.24)
GFR, Estimated: 11 mL/min — ABNORMAL LOW (ref >60)
Glucose, Bld: 285 mg/dL — ABNORMAL HIGH (ref 70–99)
Phosphorus: 4.2 mg/dL (ref 2.5–4.6)
Potassium: 3.8 mmol/L (ref 3.5–5.1)
Sodium: 133 mmol/L — ABNORMAL LOW (ref 135–145)

## 2023-01-05 MED ORDER — CHLORHEXIDINE GLUCONATE CLOTH 2 % EX PADS: 6.0000 | MEDICATED_PAD | Freq: Every day | CUTANEOUS | Status: DC

## 2023-01-05 MED ORDER — HEPARIN SODIUM (PORCINE) 1000 UNIT/ML DIALYSIS
2000.0000 [IU] | Freq: Once | INTRAMUSCULAR | Status: AC
  Administered 2023-01-05: 2000 [IU] via INTRAVENOUS_CENTRAL

## 2023-01-05 NOTE — Procedures (Signed)
We are asked to do dialysis on this patient who was discharged from his OP HD unit for behavioral issues. Shows up to ED today requesting HD here at the hospital. Plan HD upstairs then return to ED for reassessment and expected discharge. Does not require ED labs every visit, once per week should be enough.      Last HD orders:  3:15hr, 129kg, RUE AVG, UFP#4, heparin 2000unit bolus   I was present at this dialysis session, have reviewed the session itself and made  appropriate changes Kelly Splinter MD [place] pager [phone]   01/05/2023, 2:09 PM   Last Labs       Recent Labs  Lab 12/30/22 0800 12/31/22 1230 01/03/23 0826  HGB 8.7* 8.9* 9.7*  ALBUMIN 3.3* 3.2*  --   CALCIUM 8.2* 8.6* 8.9  PHOS  --  3.6  --   CREATININE 3.38* 3.57* 5.46*  K 4.0 3.5 3.9

## 2023-01-05 NOTE — ED Triage Notes (Signed)
Pt here for dialysis. Last dialyzed on Tuesday. Denies any chest pain, shob.

## 2023-01-05 NOTE — Progress Notes (Signed)
HD completed .,well tolerated with 4000 cc fluid off from him. Report given to ED CN.

## 2023-01-05 NOTE — Progress Notes (Incomplete)
We are asked to do dialysis on this patient who was discharged from his OP HD unit for behavioral issues. Shows up to ED today requesting HD here at the hospital. Plan HD upstairs then return to ED for reassessment and expected discharge. Does not require ED labs every visit, once per week should be enough.    Last HD orders:  3:15hr, 129kg, RUE AVG, UFP#4, heparin 2000unit bolus  Kelly Splinter, MD 01/05/2023, 8:22 AM  Recent Labs  Lab 12/30/22 0800 12/31/22 1230 01/03/23 0826  HGB 8.7* 8.9* 9.7*  ALBUMIN 3.3* 3.2*  --   CALCIUM 8.2* 8.6* 8.9  PHOS  --  3.6  --   CREATININE 3.38* 3.57* 5.46*  K 4.0 3.5 3.9    Inpatient medications:

## 2023-01-05 NOTE — ED Provider Notes (Signed)
[facility] Provider Note   CSN: [id number] Arrival date & time: 01/05/23  0732     History  Chief Complaint  Patient presents with   Needs Dialysis    Jeffrey [name] is a 62 y.o. male.  62 year old male with prior medical history as detailed below presents for evaluation.  Patient with ESRD and is on HD.  Patient was last dialyzed 2 days prior on Tuesday.  Patient is without current outpatient dialysis chair.  Patient has been instructed to come to the ED for dialysis pending arrangement of outpatient dialysis.  Current dialysis schedule is Tuesday, Thursday, Saturday.  Patient is without specific complaint today.    The history is provided by the patient and medical records.       Home Medications Prior to Admission medications   Medication Sig Start Date End Date Taking? Authorizing Provider  amLODipine (NORVASC) 10 MG tablet Take 10 mg by mouth every morning. 04/17/20   [provider]  ASPIRIN LOW DOSE 81 MG EC tablet Take 81 mg by mouth in the morning. 02/22/21   [provider]  atorvastatin (LIPITOR) 40 MG tablet Take 40 mg by mouth in the morning. 02/09/21   [provider]  calcium acetate (PHOSLO) 667 MG capsule Take 2 capsules (1,334 mg total) by mouth with breakfast, with lunch, and with evening meal. 04/10/21   Debbe Odea, MD  carvedilol (COREG) 12.5 MG tablet Take 12.5 mg by mouth 2 (two) times daily with a meal. 04/17/20   [provider]  Cholecalciferol (VITAMIN D) 50 MCG (2000 UT) CAPS Take 2,000 Units by mouth daily.    [provider]  clopidogrel (PLAVIX) 75 MG tablet Take 1 tablet (75 mg total) by mouth daily. 11/02/22   Rhyne, Hulen Shouts, PA-C  cyclobenzaprine (FLEXERIL) 10 MG tablet Take 10 mg by mouth 3 (three) times daily as needed for muscle spasms. 12/29/21   [provider]  diazepam (VALIUM) 5 MG tablet Take 5 mg by mouth 2 (two) times daily as needed for  muscle spasms. 12/09/21   [provider]  diphenhydrAMINE (BENADRYL) 25 MG tablet Take 50 mg by mouth every 6 (six) hours as needed for itching.    [provider]  insulin regular human CONCENTRATED (HUMULIN R U-500 KWIKPEN) 500 UNIT/ML KwikPen Inject 45 Units into the skin daily with supper. Patient taking differently: Inject 140 Units into the skin 2 (two) times daily with a meal. 09/04/21 08/30/24  [name], MD  metolazone (ZAROXOLYN) 10 MG tablet Take 10 mg by mouth daily. 07/13/21   [provider]  nortriptyline (PAMELOR) 10 MG capsule Take 20 mg by mouth at bedtime. 10/23/19 08/30/24  [provider]  oxyCODONE (ROXICODONE) 5 MG immediate release tablet Take 1 tablet (5 mg total) by mouth every 8 (eight) hours as needed. 12/31/22 12/31/23  [place] [name], DO  oxymetazoline (AFRIN) 0.05 % nasal spray Place 1 spray into both nostrils daily as needed (nose bleeds).    [provider]  pantoprazole (PROTONIX) 40 MG tablet Take 40 mg by mouth daily before breakfast. 02/09/21   [provider]  pregabalin (LYRICA) 75 MG capsule Take 75 mg by mouth See admin instructions. Daily.  Give after dialysis on dialysis days. 08/22/22   [provider]  sertraline (ZOLOFT) 100 MG tablet Take 100 mg by mouth in the morning. 02/22/21   [provider]  sodium chloride (OCEAN) 0.65 % SOLN nasal  spray Place 2 sprays into both nostrils in the morning and at bedtime.    [provider]  tadalafil (CIALIS) 20 MG tablet Take 20 mg by mouth daily as needed for erectile dysfunction.    [provider]  tamsulosin (FLOMAX) 0.4 MG CAPS capsule Take 1 capsule (0.4 mg total) by mouth daily. 11/10/21   Darliss Cheney, MD  torsemide (DEMADEX) 20 MG tablet Take 120 mg by mouth 2 (two) times daily.    [provider]  traZODone (DESYREL) 150 MG tablet Take 150 mg by mouth at bedtime. 12/09/21   [provider]       Allergies    Actos [pioglitazone], Dexmedetomidine, Ibuprofen, Tomato, and Wellbutrin [bupropion]    Review of Systems   Review of Systems  All other systems reviewed and are negative.   Physical Exam Updated Vital Signs BP (!) 147/69   Pulse 99   Temp 98.2 F (36.8 C)   Resp 17   SpO2 99%  Physical Exam Vitals and nursing note reviewed.  Constitutional:      General: He is not in acute distress.    Appearance: Normal appearance. He is well-developed.     Comments: Alert, comfortable, on chronic 3 L nasal cannula O2  HENT:     Head: Normocephalic and atraumatic.  Eyes:     Conjunctiva/sclera: Conjunctivae normal.     Pupils: Pupils are equal, round, and reactive to light.  Cardiovascular:     Rate and Rhythm: Normal rate and regular rhythm.     Heart sounds: Normal heart sounds.  Pulmonary:     Effort: Pulmonary effort is normal. No respiratory distress.     Breath sounds: Normal breath sounds.  Abdominal:     General: There is no distension.     Palpations: Abdomen is soft.     Tenderness: There is no abdominal tenderness.  Musculoskeletal:        General: No deformity. Normal range of motion.     Cervical back: Normal range of motion and neck supple.     Comments: Status post left lower extremity amputation (AKA)  Skin:    General: Skin is warm and dry.  Neurological:     General: No focal deficit present.     Mental Status: He is alert and oriented to person, place, and time.     ED Results / Procedures / Treatments   Labs (all labs ordered are listed, but only abnormal results are displayed) Labs Reviewed - No data to display  EKG None  Radiology No results found.  Procedures Procedures    Medications Ordered in ED Medications - No data to display  ED Course/ Medical Decision Making/ A&P                             Medical Decision Making Amount and/or Complexity of Data Reviewed Labs: ordered.    Medical Screen Complete  This  patient presented to the ED with complaint of need for dialysis.  This complaint involves an extensive number of treatment options. The initial differential diagnosis includes, but is not limited to, Need for dialysis This presentation is: Chronic, Self-Limited, Previously Undiagnosed, Uncertain Prognosis, Complicated, Systemic Symptoms, and Threat to Life/Bodily Function  Patient without current outpatient dialysis arrangement.  Patient is presenting to the ED for dialysis through the inpatient hospital dialysis unit.  Dr. Jonnie Finner, covering nephrology, is aware of patient.  Patient will be dialyzed today.  Patient appropriate for discharge after dialysis complete.     Additional history obtained:  External records from outside sources obtained and reviewed including prior ED visits and prior Inpatient records.   Problem List / ED Course:  ESRD   Reevaluation:  After the interventions noted above, I reevaluated the patient and found that they have: improved  Disposition:  After consideration of the diagnostic results and the patients response to treatment, I feel that the patent would benefit from discharge after dialysis.          Final Clinical Impression(s) / ED Diagnoses Final diagnoses:  ESRD (end stage renal disease) ([place])    Rx / DC Orders ED Discharge Orders     None         Valarie Merino, MD 01/05/23 1457

## 2023-01-05 NOTE — Discharge Instructions (Addendum)
Return for any problem.  ?

## 2023-01-10 ENCOUNTER — Encounter (HOSPITAL_COMMUNITY): Payer: Self-pay | Admitting: Emergency Medicine

## 2023-01-10 ENCOUNTER — Other Ambulatory Visit: Payer: Self-pay

## 2023-01-10 ENCOUNTER — Emergency Department (HOSPITAL_COMMUNITY)
Admission: EM | Admit: 2023-01-10 | Discharge: 2023-01-10 | Disposition: A | Discharge status: Home / Self Care | Payer: 59 | Attending: Student | Admitting: Student

## 2023-01-10 DIAGNOSIS — Z87891 Personal history of nicotine dependence: Secondary | ICD-10-CM | POA: Diagnosis not present

## 2023-01-10 DIAGNOSIS — E1165 Type 2 diabetes mellitus with hyperglycemia: Secondary | ICD-10-CM | POA: Insufficient documentation

## 2023-01-10 DIAGNOSIS — J449 Chronic obstructive pulmonary disease, unspecified: Secondary | ICD-10-CM | POA: Insufficient documentation

## 2023-01-10 DIAGNOSIS — I132 Hypertensive heart and chronic kidney disease with heart failure and with stage 5 chronic kidney disease, or end stage renal disease: Secondary | ICD-10-CM | POA: Insufficient documentation

## 2023-01-10 DIAGNOSIS — Z794 Long term (current) use of insulin: Secondary | ICD-10-CM | POA: Diagnosis not present

## 2023-01-10 DIAGNOSIS — R739 Hyperglycemia, unspecified: Secondary | ICD-10-CM

## 2023-01-10 DIAGNOSIS — I509 Heart failure, unspecified: Secondary | ICD-10-CM | POA: Diagnosis not present

## 2023-01-10 DIAGNOSIS — Z79899 Other long term (current) drug therapy: Secondary | ICD-10-CM | POA: Insufficient documentation

## 2023-01-10 DIAGNOSIS — Z4901 Encounter for fitting and adjustment of extracorporeal dialysis catheter: Secondary | ICD-10-CM | POA: Diagnosis present

## 2023-01-10 DIAGNOSIS — Z992 Dependence on renal dialysis: Secondary | ICD-10-CM | POA: Diagnosis not present

## 2023-01-10 DIAGNOSIS — N186 End stage renal disease: Secondary | ICD-10-CM | POA: Diagnosis not present

## 2023-01-10 DIAGNOSIS — Z7982 Long term (current) use of aspirin: Secondary | ICD-10-CM | POA: Insufficient documentation

## 2023-01-10 DIAGNOSIS — E1122 Type 2 diabetes mellitus with diabetic chronic kidney disease: Secondary | ICD-10-CM | POA: Insufficient documentation

## 2023-01-10 DIAGNOSIS — Z7902 Long term (current) use of antithrombotics/antiplatelets: Secondary | ICD-10-CM | POA: Insufficient documentation

## 2023-01-10 LAB — COMPREHENSIVE METABOLIC PANEL
ALT: 15 U/L (ref 0–44)
AST: 19 U/L (ref 15–41)
Albumin: 3.5 g/dL (ref 3.5–5.0)
Alkaline Phosphatase: 82 U/L (ref 38–126)
Anion gap: 16 — ABNORMAL HIGH (ref 5–15)
BUN: 109 mg/dL — ABNORMAL HIGH (ref 8–23)
CO2: 27 mmol/L (ref 22–32)
Calcium: 7.9 mg/dL — ABNORMAL LOW (ref 8.9–10.3)
Chloride: 88 mmol/L — ABNORMAL LOW (ref 98–111)
Creatinine, Ser: 5.8 mg/dL — ABNORMAL HIGH (ref 0.61–1.24)
GFR, Estimated: 10 mL/min — ABNORMAL LOW (ref >60)
Glucose, Bld: 537 mg/dL (ref 70–99)
Potassium: 3.5 mmol/L (ref 3.5–5.1)
Sodium: 131 mmol/L — ABNORMAL LOW (ref 135–145)
Total Bilirubin: 0.3 mg/dL (ref 0.3–1.2)
Total Protein: 7.2 g/dL (ref 6.5–8.1)

## 2023-01-10 LAB — CBC WITH DIFFERENTIAL/PLATELET
Abs Immature Granulocytes: 0.11 10*3/uL — ABNORMAL HIGH (ref 0.00–0.07)
Basophils Absolute: 0 10*3/uL (ref 0.0–0.1)
Basophils Relative: 0 %
Eosinophils Absolute: 0.1 10*3/uL (ref 0.0–0.5)
Eosinophils Relative: 2 %
HCT: 28 % — ABNORMAL LOW (ref 39.0–52.0)
Hemoglobin: 8.9 g/dL — ABNORMAL LOW (ref 13.0–17.0)
Immature Granulocytes: 2 %
Lymphocytes Relative: 10 %
Lymphs Abs: 0.7 10*3/uL (ref 0.7–4.0)
MCH: 28.2 pg (ref 26.0–34.0)
MCHC: 31.8 g/dL (ref 30.0–36.0)
MCV: 88.6 fL (ref 80.0–100.0)
Monocytes Absolute: 0.4 10*3/uL (ref 0.1–1.0)
Monocytes Relative: 6 %
Neutro Abs: 5.2 10*3/uL (ref 1.7–7.7)
Neutrophils Relative %: 80 %
Platelets: 113 10*3/uL — ABNORMAL LOW (ref 150–400)
RBC: 3.16 MIL/uL — ABNORMAL LOW (ref 4.22–5.81)
RDW: 14.1 % (ref 11.5–15.5)
WBC: 6.4 10*3/uL (ref 4.0–10.5)
nRBC: 0 % (ref 0.0–0.2)

## 2023-01-10 LAB — CBG MONITORING, ED: Glucose-Capillary: 452 mg/dL — ABNORMAL HIGH (ref 70–99)

## 2023-01-10 MED ORDER — LIDOCAINE HCL (PF) 1 % IJ SOLN: 5.0000 mL | INTRAMUSCULAR | Status: DC | PRN

## 2023-01-10 MED ORDER — LIDOCAINE-PRILOCAINE 2.5-2.5 % EX CREA: 1.0000 | TOPICAL_CREAM | CUTANEOUS | Status: DC | PRN

## 2023-01-10 MED ORDER — CHLORHEXIDINE GLUCONATE CLOTH 2 % EX PADS: 6.0000 | MEDICATED_PAD | Freq: Every day | CUTANEOUS | Status: DC

## 2023-01-10 MED ORDER — HEPARIN SODIUM (PORCINE) 1000 UNIT/ML DIALYSIS: 1000.0000 [IU] | INTRAMUSCULAR | Status: DC | PRN

## 2023-01-10 MED ORDER — HEPARIN SODIUM (PORCINE) 1000 UNIT/ML IJ SOLN
INTRAMUSCULAR | Status: AC
  Administered 2023-01-10: 2000 [IU]
  Filled 2023-01-10: qty 2

## 2023-01-10 MED ORDER — PENTAFLUOROPROP-TETRAFLUOROETH EX AERO: 1.0000 | INHALATION_SPRAY | CUTANEOUS | Status: DC | PRN

## 2023-01-10 MED ORDER — ALTEPLASE 2 MG IJ SOLR: 2.0000 mg | Freq: Once | INTRAMUSCULAR | Status: DC | PRN

## 2023-01-10 MED ORDER — INSULIN ASPART 100 UNIT/ML IJ SOLN
10.0000 [IU] | Freq: Once | INTRAMUSCULAR | Status: AC
  Administered 2023-01-10: 10 [IU] via SUBCUTANEOUS

## 2023-01-10 NOTE — ED Provider Notes (Signed)
[facility] Provider Note  CSN: [id number] Arrival date & time: 01/10/23 0741  Chief Complaint(s) Needs Dialysis  HPI Jeffrey [name] is a 62 y.o. male with ESRD on hemodialysis who presents emergency department for need for routine dialysis.  He currently does not have an outpatient option for this and has to come to the emergency department for dialysis.  Currently denies chest pain, shortness of breath, abdominal pain, nausea, vomiting or other systemic symptoms.  Last dialyzed on 01/05/2023.  Past Medical History Past Medical History:  Diagnosis Date   Altered mental status 05/04/2021   Arthritis    Blood transfusion without reported diagnosis    CHF (congestive heart failure) (HCC)    COPD (chronic obstructive pulmonary disease) (HCC)    Diabetes mellitus without complication ([place])    ESRD on hemodialysis ([place])    Hypertension    PTSD (post-traumatic stress disorder)    Sepsis ([place])    Patient Active Problem List   Diagnosis Date Noted   Acute on chronic respiratory failure with hypoxia ([place]) 11/24/2022   Uremia 11/24/2022   Encounter for removal of sutures 07/15/2022   Stress reaction of bone 07/12/2022   Benign prostatic hyperplasia with weak urinary stream 12/30/2021   Swelling of right upper extremity 11/07/2021   S/P reverse total shoulder arthroplasty, right 10/27/2021   AV graft malfunction ([place]) 09/01/2021   Dialysis AV fistula malfunction, initial encounter ([place]) 08/30/2021   Mood disorder ([place]) 08/30/2021   Disorder of ligament, right ankle 08/03/2021   Pain in right ankle and joints of right foot 08/03/2021   Local infection due to central venous catheter, sequela 07/21/2021   Acquired absence of left leg below knee ([place]) 06/21/2021   S/P AKA (above knee amputation), left ([place]) 05/04/2021   Bacteremia 04/20/2021   Other specified joint disorders, left knee 04/20/2021   Iron deficiency anemia, unspecified 04/13/2021    Anemia of chronic disease    Infection of prosthetic left knee joint ([place])    MRSA bacteremia 03/30/2021   Left knee pain    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    Fever 03/29/2021   End stage renal disease ([place]) 03/29/2021   COPD (chronic obstructive pulmonary disease) ([place]) 03/29/2021   Type 2 diabetes mellitus with hyperlipidemia ([place]) 03/29/2021   PTSD (post-traumatic stress disorder) 03/29/2021   Dyspnea, unspecified 10/12/2020   Pain in arm, unspecified 10/12/2020   Mild protein-calorie malnutrition ([place]) 09/06/2020   Status post cervical spinal fusion 08/31/2020   Controlled substance agreement signed 08/30/2020   Pain, unspecified 08/20/2020   Secondary hyperparathyroidism of renal origin ([place]) 08/15/2020   Allergy, unspecified, sequela 07/21/2020   Coagulation defect, unspecified ([place]) 07/21/2020   Encounter for immunization 07/21/2020   Personal history of anaphylaxis 07/21/2020   Cervical myelopathy ([place]) 07/03/2020   Bilateral bunions 05/08/2020   Hammer toes of both feet 05/08/2020   Onychomycosis of toenail 05/08/2020   Muscle cramps 05/07/2020   History of 2019 novel coronavirus disease (COVID-19) 11/06/2019   Cervical radiculopathy 10/28/2019   Volume overload 06/25/2019   Degenerative disc disease, cervical 04/02/2019   History of MRSA infection 10/04/2018   Spinal stenosis of lumbosacral region 08/28/2018   Primary osteoarthritis of both shoulders 02/12/2018   Rotator cuff arthropathy of both shoulders 02/12/2018   Lumbar degenerative disc disease 01/25/2018   Chronic midline low back pain without sciatica 12/15/2017   History of colon polyps 08/15/2016   Mild episode of recurrent major depressive disorder (  [place]) 05/30/2016   Vitamin D deficiency 04/18/2016   Chronic insomnia 12/21/2015   Pulmonary hypertension, mild ([place]) 04/29/2014   Chronic gastritis without bleeding 01/13/2014   Diabetic polyneuropathy associated with type 2 diabetes  mellitus ([place]) 01/13/2014   Chronic diastolic heart failure ([place]) 12/24/2013   OSA on CPAP 12/24/2013   Primary osteoarthritis of both knees 08/26/2013   Primary osteoarthritis of right knee 08/26/2013   Status post total bilateral knee replacement 05/02/2013   Mixed hyperlipidemia 07/30/2012   Class 3 severe obesity due to excess calories with serious comorbidity and body mass index (BMI) of 45.0 to 49.9 in adult [facility]) 07/27/2012   Gastroesophageal reflux disease without esophagitis 05/12/2008   Erectile dysfunction due to diseases classified elsewhere 01/19/2005   Home Medication(s) Prior to Admission medications   Medication Sig Start Date End Date Taking? Authorizing Provider  amLODipine (NORVASC) 10 MG tablet Take 10 mg by mouth every morning. 04/17/20   [provider]  ASPIRIN LOW DOSE 81 MG EC tablet Take 81 mg by mouth in the morning. 02/22/21   [provider]  atorvastatin (LIPITOR) 40 MG tablet Take 40 mg by mouth in the morning. 02/09/21   [provider]  calcium acetate (PHOSLO) 667 MG capsule Take 2 capsules (1,334 mg total) by mouth with breakfast, with lunch, and with evening meal. 04/10/21   Debbe Odea, MD  carvedilol (COREG) 12.5 MG tablet Take 12.5 mg by mouth 2 (two) times daily with a meal. 04/17/20   [provider]  Cholecalciferol (VITAMIN D) 50 MCG (2000 UT) CAPS Take 2,000 Units by mouth daily.    [provider]  clopidogrel (PLAVIX) 75 MG tablet Take 1 tablet (75 mg total) by mouth daily. 11/02/22   Rhyne, Hulen Shouts, PA-C  cyclobenzaprine (FLEXERIL) 10 MG tablet Take 10 mg by mouth 3 (three) times daily as needed for muscle spasms. 12/29/21   [provider]  diazepam (VALIUM) 5 MG tablet Take 5 mg by mouth 2 (two) times daily as needed for muscle spasms. 12/09/21   [provider]  diphenhydrAMINE (BENADRYL) 25 MG tablet Take 50 mg by mouth every 6 (six) hours as needed for itching.    [provider]  insulin regular human CONCENTRATED (HUMULIN R U-500 KWIKPEN) 500 UNIT/ML KwikPen Inject 45 Units into the skin daily with supper. Patient taking differently: Inject 140 Units into the skin 2 (two) times daily with a meal. 09/04/21 08/30/24  [name], MD  metolazone (ZAROXOLYN) 10 MG tablet Take 10 mg by mouth daily. 07/13/21   [provider]  nortriptyline (PAMELOR) 10 MG capsule Take 20 mg by mouth at bedtime. 10/23/19 08/30/24  [provider]  oxyCODONE (ROXICODONE) 5 MG immediate release tablet Take 1 tablet (5 mg total) by mouth every 8 (eight) hours as needed. 12/31/22 12/31/23  [place] [name], DO  oxymetazoline (AFRIN) 0.05 % nasal spray Place 1 spray into both nostrils daily as needed (nose bleeds).    [provider]  pantoprazole (PROTONIX) 40 MG tablet Take 40 mg by mouth daily before breakfast. 02/09/21   [provider]  pregabalin (LYRICA) 75 MG capsule Take 75 mg by mouth See admin instructions. Daily.  Give after dialysis on dialysis days. 08/22/22   [provider]  sertraline (ZOLOFT) 100 MG tablet Take 100 mg by mouth in the morning. 02/22/21   [provider]  sodium chloride (OCEAN) 0.65 % SOLN nasal spray Place 2 sprays into both nostrils in  the morning and at bedtime.    [provider]  tadalafil (CIALIS) 20 MG tablet Take 20 mg by mouth daily as needed for erectile dysfunction.    [provider]  tamsulosin (FLOMAX) 0.4 MG CAPS capsule Take 1 capsule (0.4 mg total) by mouth daily. 11/10/21   Darliss Cheney, MD  torsemide (DEMADEX) 20 MG tablet Take 120 mg by mouth 2 (two) times daily.    [provider]  traZODone (DESYREL) 150 MG tablet Take 150 mg by mouth at bedtime. 12/09/21   [provider]                                                                                                                                    Past Surgical History Past Surgical History:   Procedure Laterality Date   A/V FISTULAGRAM N/A 08/30/2021   Procedure: A/V FISTULAGRAM;  Surgeon: Waynetta Sandy, MD;  Location: [facility];  Service: Cardiovascular;  Laterality: N/A;   A/V FISTULAGRAM Right 07/28/2022   Procedure: A/V Fistulagram;  Surgeon: Waynetta Sandy, MD;  Location: [facility];  Service: Cardiovascular;  Laterality: Right;   A/V FISTULAGRAM Right 09/05/2022   Procedure: A/V Fistulagram;  Surgeon: Waynetta Sandy, MD;  Location: [facility];  Service: Cardiovascular;  Laterality: Right;   A/V FISTULAGRAM Right 10/31/2022   Procedure: A/V Fistulagram;  Surgeon: Waynetta Sandy, MD;  Location: [facility];  Service: Cardiovascular;  Laterality: Right;   A/V SHUNT INTERVENTION Right 10/31/2022   Procedure: A/V SHUNT INTERVENTION;  Surgeon: Waynetta Sandy, MD;  Location: [facility];  Service: Cardiovascular;  Laterality: Right;   A/V SHUNTOGRAM N/A 11/08/2021   Procedure: A/V SHUNTOGRAM;  Surgeon: Waynetta Sandy, MD;  Location: [facility];  Service: Cardiovascular;  Laterality: N/A;   AMPUTATION Left 04/03/2021   Procedure: LEFT ABOVE-THE-KNEE AMPUTATION;  Surgeon: Erle Crocker, MD;  Location: [facility];  Service: Orthopedics;  Laterality: Left;   AV FISTULA PLACEMENT Right 08/12/2021   Procedure: INSERTION OF ARTERIOVENOUS (AV) GORE-TEX GRAFT RIGHT ARM;  Surgeon: Serafina Mitchell, MD;  Location: [facility];  Service: Vascular;  Laterality: Right;   INSERTION OF DIALYSIS CATHETER N/A 11/25/2022   Procedure: INSERTION OF DIALYSIS CATHETER;  Surgeon: Candee Furbish, MD;  Location: [facility];  Service: Pulmonary;  Laterality: N/A;   IR REMOVAL TUN CV CATH W/O FL  03/30/2021   JOINT REPLACEMENT     Bilateral knees   PERIPHERAL VASCULAR BALLOON ANGIOPLASTY Right 08/30/2021   Procedure: PERIPHERAL VASCULAR BALLOON ANGIOPLASTY;  Surgeon: Waynetta Sandy, MD;  Location:  [facility];  Service: Cardiovascular;  Laterality: Right;   PERIPHERAL VASCULAR BALLOON ANGIOPLASTY Right 07/28/2022   Procedure: PERIPHERAL VASCULAR BALLOON ANGIOPLASTY;  Surgeon: Waynetta Sandy, MD;  Location: [facility];  Service: Cardiovascular;  Laterality: Right;  AVF   PERIPHERAL VASCULAR  INTERVENTION  11/08/2021   Procedure: PERIPHERAL VASCULAR INTERVENTION;  Surgeon: Waynetta Sandy, MD;  Location: [facility];  Service: Cardiovascular;;  Central rt arm fistula   UPPER EXTREMITY VENOGRAPHY Right 08/10/2021   Procedure: UPPER EXTREMITY VENOGRAPHY;  Surgeon: Serafina Mitchell, MD;  Location: [facility];  Service: Cardiovascular;  Laterality: Right;   Family History History reviewed. No pertinent family history.  Social History Social History   Tobacco Use   Smoking status: Former    Types: Cigarettes    Quit date: 2012    Years since quitting: 12.1   Smokeless tobacco: Never  Vaping Use   Vaping Use: Never used  Substance Use Topics   Alcohol use: Yes    Comment: occasionally   Drug use: Not Currently    Comment: Years ago   Allergies Actos [pioglitazone], Dexmedetomidine, Ibuprofen, Tomato, and Wellbutrin [bupropion]  Review of Systems Review of Systems  All other systems reviewed and are negative.   Physical Exam Vital Signs  I have reviewed the triage vital signs BP (!) 141/83 (BP Location: Left Arm)   Pulse 81   Temp (!) 97.4 F (36.3 C) (Oral)   Resp 14   Ht 5' 5"$  (1.651 m)   Wt 127.4 kg   SpO2 100%   BMI 46.74 kg/m   Physical Exam Constitutional:      General: He is not in acute distress.    Appearance: Normal appearance.  HENT:     Head: Normocephalic and atraumatic.     Nose: No congestion or rhinorrhea.  Eyes:     General:        Right eye: No discharge.        Left eye: No discharge.     Extraocular Movements: Extraocular movements intact.     Pupils: Pupils are equal, round, and reactive  to light.  Cardiovascular:     Rate and Rhythm: Normal rate and regular rhythm.     Heart sounds: No murmur heard. Pulmonary:     Effort: No respiratory distress.     Breath sounds: No wheezing or rales.  Abdominal:     General: There is no distension.     Tenderness: There is no abdominal tenderness.  Musculoskeletal:        General: Normal range of motion.     Cervical back: Normal range of motion.  Skin:    General: Skin is warm and dry.  Neurological:     General: No focal deficit present.     Mental Status: He is alert.     ED Results and Treatments Labs (all labs ordered are listed, but only abnormal results are displayed) Labs Reviewed  COMPREHENSIVE METABOLIC PANEL - Abnormal; Notable for the following components:      Result Value   Sodium 131 (*)    Chloride 88 (*)    Glucose, Bld 537 (*)    BUN 109 (*)    Creatinine, Ser 5.80 (*)    Calcium 7.9 (*)    GFR, Estimated 10 (*)    Anion gap 16 (*)    All other components within normal limits  CBC WITH DIFFERENTIAL/PLATELET - Abnormal; Notable for the following components:   RBC 3.16 (*)    Hemoglobin 8.9 (*)    HCT 28.0 (*)    Platelets 113 (*)    Abs Immature Granulocytes 0.11 (*)    All other components within normal limits  CBG MONITORING, ED - Abnormal; Notable for the following  components:   Glucose-Capillary 452 (*)    All other components within normal limits                                                                                                                          Radiology No results found.  Pertinent labs & imaging results that were available during my care of the patient were reviewed by me and considered in my medical decision making (see MDM for details).  Medications Ordered in ED Medications  Chlorhexidine Gluconate Cloth 2 % PADS 6 each (has no administration in time range)  pentafluoroprop-tetrafluoroeth (GEBAUERS) aerosol 1 Application (has no administration in time range)   lidocaine (PF) (XYLOCAINE) 1 % injection 5 mL (has no administration in time range)  lidocaine-prilocaine (EMLA) cream 1 Application (has no administration in time range)  heparin injection 1,000 Units (has no administration in time range)  alteplase (CATHFLO ACTIVASE) injection 2 mg (has no administration in time range)  insulin aspart (novoLOG) injection 10 Units (10 Units Subcutaneous Given 01/10/23 0937)  heparin sodium (porcine) 1000 UNIT/ML injection (2,000 Units  Given 01/10/23 1615)                                                                                                                                     Procedures .Critical Care  Performed by: Teressa Lower, MD Authorized by: Teressa Lower, MD   Critical care provider statement:    Critical care time (minutes):  30   Critical care was necessary to treat or prevent imminent or life-threatening deterioration of the following conditions:  Endocrine crisis   Critical care was time spent personally by me on the following activities:  Development of treatment plan with patient or surrogate, discussions with consultants, evaluation of patient's response to treatment, examination of patient, ordering and review of laboratory studies, ordering and review of radiographic studies, ordering and performing treatments and interventions, pulse oximetry, re-evaluation of patient's condition and review of old charts   (including critical care time)  Medical Decision Making / ED Course   This patient presents to the ED for concern of need for dialysis, this involves an extensive number of treatment options, and is a complaint that carries with it a high risk of complications and morbidity.  The differential diagnosis includes need for routine dialysis, electrolyte abnormality, fluid overload  MDM: Patient seen the emergency room for evaluation of  need for dialysis.  Physical exam largely unremarkable.  Laboratory evaluation with a  hemoglobin of 8.9, glucose 537, BUN 109, creatinine 5.80.  Anion gap minimally elevated at 16 but CO2 is normal at 27.  Patient received subcutaneous insulin for his hyperglycemia with improvement of his glucose.  I spoke with Dr. Joylene Grapes of nephrology who will arrange dialysis here in the hospital.  Anticipate discharge afterwards.     Lab Tests: -I ordered, reviewed, and interpreted labs.   The pertinent results include:   Labs Reviewed  COMPREHENSIVE METABOLIC PANEL - Abnormal; Notable for the following components:      Result Value   Sodium 131 (*)    Chloride 88 (*)    Glucose, Bld 537 (*)    BUN 109 (*)    Creatinine, Ser 5.80 (*)    Calcium 7.9 (*)    GFR, Estimated 10 (*)    Anion gap 16 (*)    All other components within normal limits  CBC WITH DIFFERENTIAL/PLATELET - Abnormal; Notable for the following components:   RBC 3.16 (*)    Hemoglobin 8.9 (*)    HCT 28.0 (*)    Platelets 113 (*)    Abs Immature Granulocytes 0.11 (*)    All other components within normal limits  CBG MONITORING, ED - Abnormal; Notable for the following components:   Glucose-Capillary 452 (*)    All other components within normal limits      EKG   EKG Interpretation  Date/Time:    Ventricular Rate:    PR Interval:    QRS Duration:   QT Interval:    QTC Calculation:   R Axis:     Text Interpretation:             Medicines ordered and prescription drug management: Meds ordered this encounter  Medications   Chlorhexidine Gluconate Cloth 2 % PADS 6 each   pentafluoroprop-tetrafluoroeth (GEBAUERS) aerosol 1 Application   lidocaine (PF) (XYLOCAINE) 1 % injection 5 mL   lidocaine-prilocaine (EMLA) cream 1 Application   heparin injection 1,000 Units   alteplase (CATHFLO ACTIVASE) injection 2 mg   insulin aspart (novoLOG) injection 10 Units   heparin sodium (porcine) 1000 UNIT/ML injection    [place], Albert T: cabinet override    -I have reviewed the patients home medicines and have  made adjustments as needed  Critical interventions Insulin  Consultations Obtained: I requested consultation with the nephrologist on-call Dr. Joylene Grapes,  and discussed lab and imaging findings as well as pertinent plan - they recommend: Dialysis today   Cardiac Monitoring: The patient was maintained on a cardiac monitor.  I personally viewed and interpreted the cardiac monitored which showed an underlying rhythm of: NSR  Social Determinants of Health:  Factors impacting patients care include: No longer able to obtain outpatient dialysis   Reevaluation: After the interventions noted above, I reevaluated the patient and found that they have :improved  Co morbidities that complicate the patient evaluation  Past Medical History:  Diagnosis Date   Altered mental status 05/04/2021   Arthritis    Blood transfusion without reported diagnosis    CHF (congestive heart failure) (HCC)    COPD (chronic obstructive pulmonary disease) ([place])    Diabetes mellitus without complication ([place])    ESRD on hemodialysis ([place])    Hypertension    PTSD (post-traumatic stress disorder)    Sepsis ([place])       Dispostion: I considered admission for this patient, and disposition pending reevaluation after dialysis.  Anticipate discharge home     Final Clinical Impression(s) / ED Diagnoses Final diagnoses:  None     @PCDICTATION$ @    Teressa Lower, MD 01/10/23 [phone]

## 2023-01-10 NOTE — Progress Notes (Addendum)
Received patient in his electric wheel chair.He was able to transfer himself into ED stretcher He has oxygen at 3 pm/[place].Vitals were stable.  Access used LEFT avg that work well.  Duration of treatment : 2 hours and 59 minutes instead of 3.5 hours  Medicines given :Heparin 2,000 units .  Fluid removed 2,400 cc  Bolus given : 200 cc  Hemo tx issue : Severe cramping on his good leg and abdomen. 200 cc NS bolus given but barely touched him.Patient quit ,31 minutes left on his treatment.                           Patient verbalized that he could no longer wait for the transporter,he said " my wife is waiting for me.HD nurse explained that transporter is on the way, patient wheeled himself toward the unit door ,per other nurse ,he was met by the transporter and escorted back to E.D. CN made aware and called the E.D C.N.  Hand off to the patient's nurse.

## 2023-01-10 NOTE — Discharge Instructions (Addendum)
1.  Your dialysis has been completed. 2.  Follow-up with your primary doctor and nephrology per your regular appointments.

## 2023-01-10 NOTE — ED Triage Notes (Signed)
Pt presents for dialysis treatment, Denies any chest pain or shortness of breath last dialyzed on Thursday (2/8).

## 2023-01-10 NOTE — Inpatient Diabetes Management (Signed)
Inpatient Diabetes Program Recommendations  AACE/ADA: New Consensus Statement on Inpatient Glycemic Control (2015)  Target Ranges:  Prepandial:   less than 140 mg/dL      Peak postprandial:   less than 180 mg/dL (1-2 hours)      Critically ill patients:  140 - 180 mg/dL   Lab Results  Component Value Date   GLUCAP 452 (H) 01/10/2023   HGBA1C 10.5 (H) 11/25/2022    Review of Glycemic Control  Latest Reference Range & Units 01/10/23 10:58  Glucose-Capillary 70 - 99 mg/dL 452 (H)   Diabetes history: DM 2 Outpatient Diabetes medications: Humulin R U-500 140 units bid Current orders for Inpatient glycemic control:  None currently in ED  Inpatient Diabetes Program Recommendations:    -  reorder home insulin Humulin R U-500 80 units bid with meals -  Novolog 0-15 units tid  A1c 10.5% on 12/29. Pt very familiar to our team and has been counseled many times, the most recent time on 1/2. Pt very difficult to control glucose trends due to HD, Sees [facility] every 3 months.  Thanks,  Tama Headings RN, MSN, BC-ADM Inpatient Diabetes Coordinator Team Pager [phone] (8a-5p)

## 2023-01-10 NOTE — Progress Notes (Signed)
Jeffrey [name] presents to the ED requesting hemodialysis. He was previously discharged from his outpatient facility d/t behavioral issues. Plan for HD today. He will then return back to the ED for reassessment and more likely will be able to go home afterwards.  Last HD orders:  3:15hr, 129kg, RUE AVG, UFP#4, heparin 2000unit bolus  Tobie Poet, NP [employer]

## 2023-01-10 NOTE — ED Notes (Signed)
Informed consent for Dialysis completed with patient and verified by this RN. Informed Consent remains at bedside.

## 2023-01-10 NOTE — Progress Notes (Signed)
VS 141/83, 14, 81, 100 R/A, 97.4. Pt. Tolerated procedure well. Removed 2.1 L 3 hr 15 min.

## 2023-01-12 ENCOUNTER — Emergency Department (HOSPITAL_COMMUNITY)
Admission: EM | Admit: 2023-01-12 | Discharge: 2023-01-12 | Disposition: A | Discharge status: Home / Self Care | Payer: 59 | Attending: Emergency Medicine | Admitting: Emergency Medicine

## 2023-01-12 ENCOUNTER — Other Ambulatory Visit: Payer: Self-pay

## 2023-01-12 DIAGNOSIS — Z7902 Long term (current) use of antithrombotics/antiplatelets: Secondary | ICD-10-CM | POA: Insufficient documentation

## 2023-01-12 DIAGNOSIS — Z992 Dependence on renal dialysis: Secondary | ICD-10-CM | POA: Diagnosis not present

## 2023-01-12 DIAGNOSIS — Z794 Long term (current) use of insulin: Secondary | ICD-10-CM | POA: Insufficient documentation

## 2023-01-12 DIAGNOSIS — N186 End stage renal disease: Secondary | ICD-10-CM | POA: Diagnosis present

## 2023-01-12 LAB — CBC WITH DIFFERENTIAL/PLATELET
Abs Immature Granulocytes: 0.19 10*3/uL — ABNORMAL HIGH (ref 0.00–0.07)
Basophils Absolute: 0 10*3/uL (ref 0.0–0.1)
Basophils Relative: 0 %
Eosinophils Absolute: 0.1 10*3/uL (ref 0.0–0.5)
Eosinophils Relative: 2 %
HCT: 31.6 % — ABNORMAL LOW (ref 39.0–52.0)
Hemoglobin: 10.2 g/dL — ABNORMAL LOW (ref 13.0–17.0)
Immature Granulocytes: 3 %
Lymphocytes Relative: 12 %
Lymphs Abs: 0.9 10*3/uL (ref 0.7–4.0)
MCH: 28.4 pg (ref 26.0–34.0)
MCHC: 32.3 g/dL (ref 30.0–36.0)
MCV: 88 fL (ref 80.0–100.0)
Monocytes Absolute: 0.5 10*3/uL (ref 0.1–1.0)
Monocytes Relative: 7 %
Neutro Abs: 5.9 10*3/uL (ref 1.7–7.7)
Neutrophils Relative %: 76 %
Platelets: 138 10*3/uL — ABNORMAL LOW (ref 150–400)
RBC: 3.59 MIL/uL — ABNORMAL LOW (ref 4.22–5.81)
RDW: 14.2 % (ref 11.5–15.5)
WBC: 7.7 10*3/uL (ref 4.0–10.5)
nRBC: 0 % (ref 0.0–0.2)

## 2023-01-12 LAB — BASIC METABOLIC PANEL
Anion gap: 14 (ref 5–15)
BUN: 83 mg/dL — ABNORMAL HIGH (ref 8–23)
CO2: 30 mmol/L (ref 22–32)
Calcium: 8.6 mg/dL — ABNORMAL LOW (ref 8.9–10.3)
Chloride: 92 mmol/L — ABNORMAL LOW (ref 98–111)
Creatinine, Ser: 5.11 mg/dL — ABNORMAL HIGH (ref 0.61–1.24)
GFR, Estimated: 12 mL/min — ABNORMAL LOW (ref >60)
Glucose, Bld: 86 mg/dL (ref 70–99)
Potassium: 3.2 mmol/L — ABNORMAL LOW (ref 3.5–5.1)
Sodium: 136 mmol/L (ref 135–145)

## 2023-01-12 MED ORDER — CHLORHEXIDINE GLUCONATE CLOTH 2 % EX PADS: 6.0000 | MEDICATED_PAD | Freq: Every day | CUTANEOUS | Status: DC

## 2023-01-12 MED ORDER — LIDOCAINE-PRILOCAINE 2.5-2.5 % EX CREA
1.0000 | TOPICAL_CREAM | CUTANEOUS | Status: DC | PRN
  Filled 2023-01-12: qty 5

## 2023-01-12 MED ORDER — HEPARIN SODIUM (PORCINE) 1000 UNIT/ML IJ SOLN
2000.0000 [IU] | Freq: Once | INTRAMUSCULAR | Status: AC
  Filled 2023-01-12: qty 2

## 2023-01-12 MED ORDER — HEPARIN SODIUM (PORCINE) 1000 UNIT/ML IJ SOLN
INTRAMUSCULAR | Status: AC
  Administered 2023-01-12: 2000 [IU] via INTRAVENOUS
  Filled 2023-01-12: qty 2

## 2023-01-12 MED ORDER — HEPARIN SODIUM (PORCINE) 1000 UNIT/ML DIALYSIS
1000.0000 [IU] | INTRAMUSCULAR | Status: DC | PRN
  Filled 2023-01-12: qty 1

## 2023-01-12 MED ORDER — ALTEPLASE 2 MG IJ SOLR: 2.0000 mg | Freq: Once | INTRAMUSCULAR | Status: DC | PRN

## 2023-01-12 MED ORDER — LIDOCAINE HCL (PF) 1 % IJ SOLN: 5.0000 mL | INTRAMUSCULAR | Status: DC | PRN

## 2023-01-12 MED ORDER — PENTAFLUOROPROP-TETRAFLUOROETH EX AERO
1.0000 | INHALATION_SPRAY | CUTANEOUS | Status: DC | PRN
  Filled 2023-01-12: qty 30

## 2023-01-12 NOTE — ED Notes (Signed)
Pt taken to dialysis 

## 2023-01-12 NOTE — Progress Notes (Signed)
TX duration:3.3  Patient tolerated well.  Transported back to the room  Alert, without acute distress.  Hand-off given to patient's nurse.   Access used: AVG Access issues: n/a  Total UF removed: 2500 Medication(s) given: n/a Post HD VS:    01/12/23 1414  Vitals  Temp 98 F (36.7 C)  Temp Source Oral  BP (!) 146/78  MAP (mmHg) 93  BP Method Automatic  Patient Position (if appropriate) Lying  Pulse Rate 86  Resp 15  Oxygen Therapy  SpO2 96 %  O2 Device Nasal Cannula  O2 Flow Rate (L/min) 3 L/min  During Treatment Monitoring  Intra-Hemodialysis Comments Tx completed  Post Treatment  Dialyzer Clearance Lightly streaked  Duration of HD Treatment -hour(s) 3.3 hour(s)  Liters Processed 84  Fluid Removed (mL) 2500 mL  Tolerated HD Treatment Yes       [facility]

## 2023-01-12 NOTE — ED Provider Notes (Signed)
[facility] Provider Note   CSN: [id number] Arrival date & time: 01/12/23  0741     History  Chief Complaint  Patient presents with   Vascular Access Problem    OLIS SWANNER is a 62 y.o. male.  62 year old male with history of ESRD presents needing dialysis.  Patient currently does not have a dialysis center.  Was seen 2 days ago for similar symptoms and was dialyzed at this time.  Denies any new complaints at this time.       Home Medications Prior to Admission medications   Medication Sig Start Date End Date Taking? Authorizing Provider  amLODipine (NORVASC) 10 MG tablet Take 10 mg by mouth every morning. 04/17/20   [provider]  ASPIRIN LOW DOSE 81 MG EC tablet Take 81 mg by mouth in the morning. 02/22/21   [provider]  atorvastatin (LIPITOR) 40 MG tablet Take 40 mg by mouth in the morning. 02/09/21   [provider]  calcium acetate (PHOSLO) 667 MG capsule Take 2 capsules (1,334 mg total) by mouth with breakfast, with lunch, and with evening meal. 04/10/21   Debbe Odea, MD  carvedilol (COREG) 12.5 MG tablet Take 12.5 mg by mouth 2 (two) times daily with a meal. 04/17/20   [provider]  Cholecalciferol (VITAMIN D) 50 MCG (2000 UT) CAPS Take 2,000 Units by mouth daily.    [provider]  clopidogrel (PLAVIX) 75 MG tablet Take 1 tablet (75 mg total) by mouth daily. 11/02/22   Rhyne, Hulen Shouts, PA-C  cyclobenzaprine (FLEXERIL) 10 MG tablet Take 10 mg by mouth 3 (three) times daily as needed for muscle spasms. 12/29/21   [provider]  diazepam (VALIUM) 5 MG tablet Take 5 mg by mouth 2 (two) times daily as needed for muscle spasms. 12/09/21   [provider]  diphenhydrAMINE (BENADRYL) 25 MG tablet Take 50 mg by mouth every 6 (six) hours as needed for itching.    [provider]  insulin regular human CONCENTRATED (HUMULIN R U-500 KWIKPEN) 500 UNIT/ML  KwikPen Inject 45 Units into the skin daily with supper. Patient taking differently: Inject 140 Units into the skin 2 (two) times daily with a meal. 09/04/21 08/30/24  [name], MD  metolazone (ZAROXOLYN) 10 MG tablet Take 10 mg by mouth daily. 07/13/21   [provider]  nortriptyline (PAMELOR) 10 MG capsule Take 20 mg by mouth at bedtime. 10/23/19 08/30/24  [provider]  oxyCODONE (ROXICODONE) 5 MG immediate release tablet Take 1 tablet (5 mg total) by mouth every 8 (eight) hours as needed. 12/31/22 12/31/23  [place] [name], DO  oxymetazoline (AFRIN) 0.05 % nasal spray Place 1 spray into both nostrils daily as needed (nose bleeds).    [provider]  pantoprazole (PROTONIX) 40 MG tablet Take 40 mg by mouth daily before breakfast. 02/09/21   [provider]  pregabalin (LYRICA) 75 MG capsule Take 75 mg by mouth See admin instructions. Daily.  Give after dialysis on dialysis days. 08/22/22   [provider]  sertraline (ZOLOFT) 100 MG tablet Take 100 mg by mouth in the morning. 02/22/21   [provider]  sodium chloride (OCEAN) 0.65 % SOLN nasal spray Place 2 sprays into both nostrils in the morning and at bedtime.    [provider]  tadalafil (CIALIS) 20 MG tablet Take 20 mg by mouth daily as needed for erectile dysfunction.    [provider]  tamsulosin (FLOMAX) 0.4 MG CAPS capsule Take 1 capsule (0.4 mg total) by mouth daily. 11/10/21   Darliss Cheney, MD  torsemide (DEMADEX) 20 MG tablet Take 120 mg by mouth 2 (two) times daily.    [provider]  traZODone (DESYREL) 150 MG tablet Take 150 mg by mouth at bedtime. 12/09/21   [provider]      Allergies    Actos [pioglitazone], Dexmedetomidine, Ibuprofen, Tomato, and Wellbutrin [bupropion]    Review of Systems   Review of Systems  All other systems reviewed and are negative.   Physical Exam Updated Vital Signs BP (!) 175/66 (BP Location:  Left Arm)   Pulse 90   Temp 98.7 F (37.1 C) (Oral)   Resp 20   Ht 1.651 m (5' 5"$ )   Wt 132.9 kg   SpO2 100%   BMI 48.76 kg/m  Physical Exam Vitals and nursing note reviewed.  Constitutional:      General: He is not in acute distress.    Appearance: Normal appearance. He is well-developed. He is not toxic-appearing.  HENT:     Head: Normocephalic and atraumatic.  Eyes:     General: Lids are normal.     Conjunctiva/sclera: Conjunctivae normal.     Pupils: Pupils are equal, round, and reactive to light.  Neck:     Thyroid: No thyroid mass.     Trachea: No tracheal deviation.  Cardiovascular:     Rate and Rhythm: Normal rate and regular rhythm.     Heart sounds: Normal heart sounds. No murmur heard.    No gallop.  Pulmonary:     Effort: Pulmonary effort is normal. No respiratory distress.     Breath sounds: Normal breath sounds. No stridor. No decreased breath sounds, wheezing, rhonchi or rales.  Abdominal:     General: There is no distension.     Palpations: Abdomen is soft.     Tenderness: There is no abdominal tenderness. There is no rebound.  Musculoskeletal:        General: No tenderness. Normal range of motion.     Cervical back: Normal range of motion and neck supple.  Skin:    General: Skin is warm and dry.     Findings: No abrasion or rash.  Neurological:     Mental Status: He is alert and oriented to person, place, and time. Mental status is at baseline.     GCS: GCS eye subscore is 4. GCS verbal subscore is 5. GCS motor subscore is 6.     Cranial Nerves: No cranial nerve deficit.     Sensory: No sensory deficit.     Motor: Motor function is intact.  Psychiatric:        Attention and Perception: Attention normal.        Speech: Speech normal.        Behavior: Behavior normal.     ED Results / Procedures / Treatments   Labs (all labs ordered are listed, but only abnormal results are displayed) Labs Reviewed  CBC WITH DIFFERENTIAL/PLATELET  BASIC  METABOLIC PANEL    EKG EKG Interpretation  Date/Time:  Thursday January 12 2023 08:41:01 EST Ventricular Rate:  92 PR Interval:  156 QRS Duration: 86 QT Interval:  372 QTC Calculation: 460 R Axis:   87 Text Interpretation: Normal sinus rhythm Normal ECG When compared with ECG of 05-Jan-2023 09:04, PREVIOUS ECG IS PRESENT No significant change since last tracing Confirmed by Lacretia Leigh ([id number]) on 01/12/2023 9:16:17 AM  Radiology No results found.  Procedures Procedures    Medications Ordered in ED Medications - No data to display  ED Course/ Medical Decision Making/ A&P                             Medical Decision Making Amount and/or Complexity of Data Reviewed Labs: ordered. ECG/medicine tests: ordered.   Plan will be to check screening labs and consult nephrology for dialysis  9:15 AM Patient's labs rechecked here and show a potassium of 3.2.  Patient is EKG per my interpretation shows normal sinus rhythm.  This EKG is unchanged from prior.  Will consult nephrology for dialysis       Final Clinical Impression(s) / ED Diagnoses Final diagnoses:  None    Rx / DC Orders ED Discharge Orders     None         Lacretia Leigh, MD 01/12/23 [phone]

## 2023-01-12 NOTE — Procedures (Signed)
HD Note:  Some information was entered later than the data was gathered due to patient care needs. The stated time with the data is accurate.  Retrieved patient from ER.  He was in his electric wheelchair.  This writer pushed a stretcher to the unit for the patient to lie on while receiving treatment.   Alert and oriented.  Informed consent signed and in chart.   TX duration:3.5 hours  Patient tolerated well.  Patient accompanied back to the ER for discharge post treatment Alert, without acute distress.  Hand-off given to patient's nurse.   Access used: Right upper arm graft. Access issues: None     [facility]

## 2023-01-12 NOTE — ED Triage Notes (Signed)
Pt. For dialysis

## 2023-01-12 NOTE — ED Notes (Signed)
Pt has no medical complaints.  Sts he is "here for dialysis."

## 2023-01-12 NOTE — Procedures (Signed)
Patient seen on Hemodialysis. BP (!) 175/66 (BP Location: Left Arm)   Pulse 90   Temp 98.7 F (37.1 C) (Oral)   Resp 20   Ht 5' 5"$  (1.651 m)   Wt 132.9 kg   SpO2 100%   BMI 48.76 kg/m   QB 400, UF goal 2L Tolerating treatment without complaints at this time.   Elmarie Shiley MD [facility]. Office # [phone] Pager # [phone] 10:31 AM

## 2023-01-12 NOTE — ED Notes (Signed)
Dialysis reports they have called transport for Pt.

## 2023-01-14 ENCOUNTER — Emergency Department (HOSPITAL_COMMUNITY)
Admission: EM | Admit: 2023-01-14 | Discharge: 2023-01-14 | Disposition: A | Discharge status: Home / Self Care | Payer: 59 | Attending: Emergency Medicine | Admitting: Emergency Medicine

## 2023-01-14 ENCOUNTER — Other Ambulatory Visit: Payer: Self-pay

## 2023-01-14 DIAGNOSIS — Z7982 Long term (current) use of aspirin: Secondary | ICD-10-CM | POA: Insufficient documentation

## 2023-01-14 DIAGNOSIS — Z79899 Other long term (current) drug therapy: Secondary | ICD-10-CM | POA: Insufficient documentation

## 2023-01-14 DIAGNOSIS — Z794 Long term (current) use of insulin: Secondary | ICD-10-CM | POA: Insufficient documentation

## 2023-01-14 DIAGNOSIS — N186 End stage renal disease: Secondary | ICD-10-CM | POA: Diagnosis not present

## 2023-01-14 DIAGNOSIS — Z992 Dependence on renal dialysis: Secondary | ICD-10-CM | POA: Insufficient documentation

## 2023-01-14 LAB — COMPREHENSIVE METABOLIC PANEL
ALT: 20 U/L (ref 0–44)
AST: 19 U/L (ref 15–41)
Albumin: 3.6 g/dL (ref 3.5–5.0)
Alkaline Phosphatase: 79 U/L (ref 38–126)
Anion gap: 18 — ABNORMAL HIGH (ref 5–15)
BUN: 77 mg/dL — ABNORMAL HIGH (ref 8–23)
CO2: 24 mmol/L (ref 22–32)
Calcium: 8.5 mg/dL — ABNORMAL LOW (ref 8.9–10.3)
Chloride: 93 mmol/L — ABNORMAL LOW (ref 98–111)
Creatinine, Ser: 5.91 mg/dL — ABNORMAL HIGH (ref 0.61–1.24)
GFR, Estimated: 10 mL/min — ABNORMAL LOW (ref >60)
Glucose, Bld: 297 mg/dL — ABNORMAL HIGH (ref 70–99)
Potassium: 3.6 mmol/L (ref 3.5–5.1)
Sodium: 135 mmol/L (ref 135–145)
Total Bilirubin: 0.2 mg/dL — ABNORMAL LOW (ref 0.3–1.2)
Total Protein: 7.2 g/dL (ref 6.5–8.1)

## 2023-01-14 LAB — CBC
HCT: 29.2 % — ABNORMAL LOW (ref 39.0–52.0)
Hemoglobin: 9.4 g/dL — ABNORMAL LOW (ref 13.0–17.0)
MCH: 28.7 pg (ref 26.0–34.0)
MCHC: 32.2 g/dL (ref 30.0–36.0)
MCV: 89 fL (ref 80.0–100.0)
Platelets: 143 10*3/uL — ABNORMAL LOW (ref 150–400)
RBC: 3.28 MIL/uL — ABNORMAL LOW (ref 4.22–5.81)
RDW: 14.4 % (ref 11.5–15.5)
WBC: 9.5 10*3/uL (ref 4.0–10.5)
nRBC: 0 % (ref 0.0–0.2)

## 2023-01-14 MED ORDER — HEPARIN SODIUM (PORCINE) 1000 UNIT/ML DIALYSIS
2000.0000 [IU] | Freq: Once | INTRAMUSCULAR | Status: AC
  Administered 2023-01-14: 2000 [IU] via INTRAVENOUS_CENTRAL
  Filled 2023-01-14: qty 2

## 2023-01-14 MED ORDER — HEPARIN SODIUM (PORCINE) 1000 UNIT/ML DIALYSIS
2000.0000 [IU] | INTRAMUSCULAR | Status: AC | PRN
  Administered 2023-01-14: 2000 [IU] via INTRAVENOUS_CENTRAL
  Filled 2023-01-14: qty 2

## 2023-01-14 MED ORDER — CHLORHEXIDINE GLUCONATE CLOTH 2 % EX PADS: 6.0000 | MEDICATED_PAD | Freq: Every day | CUTANEOUS | Status: DC

## 2023-01-14 NOTE — ED Notes (Signed)
Switched patient's O2 tubing out for new nasal cannula. Patient switched to his secondary tank and informed that his primary tank is empty. Patient verbalized understanding of same and reports that we'll see him on Tuesday. Pleasant demeanor and no distress at this time.

## 2023-01-14 NOTE — Progress Notes (Signed)
POST HD TX NOTE  01/14/23 1259  Vitals  Temp 98.4 F (36.9 C)  Temp Source Oral  BP (!) 110/56  MAP (mmHg) 69  BP Location Left Arm  BP Method Automatic  Patient Position (if appropriate) Lying  Pulse Rate 92  Pulse Rate Source Monitor  ECG Heart Rate 92  Resp 18  Oxygen Therapy  SpO2 100 %  O2 Device Nasal Cannula  O2 Flow Rate (L/min) 2 L/min  Pulse Oximetry Type Continuous  During Treatment Monitoring  Intra-Hemodialysis Comments (S)   (post HD tx VS check)  Post Treatment  Dialyzer Clearance Lightly streaked  Duration of HD Treatment -hour(s) 3.5 hour(s)  Hemodialysis Intake (mL) 0 mL  Liters Processed 74  Fluid Removed (mL) 4000 mL  Tolerated HD Treatment Yes  Post-Hemodialysis Comments (S)  tx completed w/o problem, UF goal met, blood rinsed back. Medication Admin: Heparin 2000 units bolus at beginning of tx, Heparin 2000 units bolus mid tx  AVG/AVF Arterial Site Held (minutes) 6 minutes  AVG/AVF Venous Site Held (minutes) 6 minutes  Fistula / Graft Right Upper arm Arteriovenous vein graft  Placement Date: 10/31/22   Placed prior to admission: Yes  Orientation: Right  Access Location: Upper arm  Access Type: Arteriovenous vein graft  Site Condition No complications  Fistula / Graft Assessment Bruit;Thrill;Present  Drainage Description None

## 2023-01-14 NOTE — ED Triage Notes (Signed)
Patient requesting hemodialysis .

## 2023-01-14 NOTE — Procedures (Signed)
   I was present at this dialysis session, have reviewed the session itself and made  appropriate changes Kelly Splinter MD [place] pager [phone]   01/14/2023, 12:15 PM
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
## 2023-01-14 NOTE — Discharge Instructions (Signed)
Return as needed

## 2023-01-17 ENCOUNTER — Emergency Department (HOSPITAL_COMMUNITY)
Admission: EM | Admit: 2023-01-17 | Discharge: 2023-01-17 | Disposition: A | Discharge status: Home / Self Care | Payer: 59 | Attending: Emergency Medicine | Admitting: Emergency Medicine

## 2023-01-17 ENCOUNTER — Encounter (HOSPITAL_COMMUNITY): Payer: Self-pay

## 2023-01-17 ENCOUNTER — Other Ambulatory Visit: Payer: Self-pay

## 2023-01-17 DIAGNOSIS — Z992 Dependence on renal dialysis: Secondary | ICD-10-CM | POA: Diagnosis not present

## 2023-01-17 DIAGNOSIS — Z7902 Long term (current) use of antithrombotics/antiplatelets: Secondary | ICD-10-CM | POA: Diagnosis not present

## 2023-01-17 DIAGNOSIS — Z7982 Long term (current) use of aspirin: Secondary | ICD-10-CM | POA: Diagnosis not present

## 2023-01-17 DIAGNOSIS — I509 Heart failure, unspecified: Secondary | ICD-10-CM | POA: Insufficient documentation

## 2023-01-17 DIAGNOSIS — N189 Chronic kidney disease, unspecified: Secondary | ICD-10-CM | POA: Insufficient documentation

## 2023-01-17 DIAGNOSIS — J449 Chronic obstructive pulmonary disease, unspecified: Secondary | ICD-10-CM | POA: Insufficient documentation

## 2023-01-17 DIAGNOSIS — I130 Hypertensive heart and chronic kidney disease with heart failure and stage 1 through stage 4 chronic kidney disease, or unspecified chronic kidney disease: Secondary | ICD-10-CM | POA: Insufficient documentation

## 2023-01-17 DIAGNOSIS — Z79899 Other long term (current) drug therapy: Secondary | ICD-10-CM | POA: Insufficient documentation

## 2023-01-17 DIAGNOSIS — E1122 Type 2 diabetes mellitus with diabetic chronic kidney disease: Secondary | ICD-10-CM | POA: Diagnosis not present

## 2023-01-17 DIAGNOSIS — N186 End stage renal disease: Secondary | ICD-10-CM

## 2023-01-17 DIAGNOSIS — Z794 Long term (current) use of insulin: Secondary | ICD-10-CM | POA: Insufficient documentation

## 2023-01-17 LAB — RENAL FUNCTION PANEL
Albumin: 3.6 g/dL (ref 3.5–5.0)
Anion gap: 15 (ref 5–15)
BUN: 96 mg/dL — ABNORMAL HIGH (ref 8–23)
CO2: 27 mmol/L (ref 22–32)
Calcium: 8.5 mg/dL — ABNORMAL LOW (ref 8.9–10.3)
Chloride: 92 mmol/L — ABNORMAL LOW (ref 98–111)
Creatinine, Ser: 5.78 mg/dL — ABNORMAL HIGH (ref 0.61–1.24)
GFR, Estimated: 10 mL/min — ABNORMAL LOW (ref >60)
Glucose, Bld: 244 mg/dL — ABNORMAL HIGH (ref 70–99)
Phosphorus: 4.1 mg/dL (ref 2.5–4.6)
Potassium: 3.6 mmol/L (ref 3.5–5.1)
Sodium: 134 mmol/L — ABNORMAL LOW (ref 135–145)

## 2023-01-17 LAB — CBC
HCT: 28.6 % — ABNORMAL LOW (ref 39.0–52.0)
Hemoglobin: 9.4 g/dL — ABNORMAL LOW (ref 13.0–17.0)
MCH: 29 pg (ref 26.0–34.0)
MCHC: 32.9 g/dL (ref 30.0–36.0)
MCV: 88.3 fL (ref 80.0–100.0)
Platelets: 155 10*3/uL (ref 150–400)
RBC: 3.24 MIL/uL — ABNORMAL LOW (ref 4.22–5.81)
RDW: 14.2 % (ref 11.5–15.5)
WBC: 10.1 10*3/uL (ref 4.0–10.5)
nRBC: 0 % (ref 0.0–0.2)

## 2023-01-17 MED ORDER — HEPARIN SODIUM (PORCINE) 1000 UNIT/ML DIALYSIS: 2000.0000 [IU] | INTRAMUSCULAR | Status: DC | PRN

## 2023-01-17 MED ORDER — HEPARIN SODIUM (PORCINE) 1000 UNIT/ML DIALYSIS
2000.0000 [IU] | INTRAMUSCULAR | Status: AC | PRN
  Administered 2023-01-17: 2000 [IU] via INTRAVENOUS_CENTRAL

## 2023-01-17 MED ORDER — CHLORHEXIDINE GLUCONATE CLOTH 2 % EX PADS: 6.0000 | MEDICATED_PAD | Freq: Every day | CUTANEOUS | Status: DC

## 2023-01-17 MED ORDER — HEPARIN SODIUM (PORCINE) 1000 UNIT/ML IJ SOLN
INTRAMUSCULAR | Status: AC
  Filled 2023-01-17: qty 2

## 2023-01-17 NOTE — ED Notes (Signed)
Dialysis site dry clean and intact at time of Dc. Pt has home O2 set at 3 liter nasal per home use.
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
## 2023-01-17 NOTE — Progress Notes (Signed)
Transport arrived to return pt back to the ED. Pt began getting angry, yelling, and irrate stating that he is going to transfer to his mobile wheelchair.Ddespite having the conversation prior with HD AD that due to safety and him almost falling several times that he will transport back to ED in stretcher and come to HD in stretcher, he insisted on transferring to his mobile [facility] in ED. He began to curse saying "ill be damn don't touch his wheel chair noone is qualified to move it." After we told him HD RN will go with transport to maneuver the chair. During the transport to HD he continued to curse and yell. When arriving to the ED, ED RN and HD RN tried to assist the pt with transfer from stretcher to chair. He started yelling and cursing dont fucking touch me. Do Not put ur hands on me, Refusing for staff to safely assist him with transfer.

## 2023-01-17 NOTE — Progress Notes (Signed)
Received patient in bed to unit.  Alert and oriented.  Informed consent signed and in chart.   TX duration:3.25  Patient tolerated well.  Transported back to the room  Alert, without acute distress.  Hand-off given to patient's nurse.   Access used: left AVG Access issues: none  Total UF removed: 3L Medication(s) given: none    01/17/23 1225  Vitals  Temp 98.3 F (36.8 C)  Temp Source Oral  BP 118/64  MAP (mmHg) 78  BP Location Left Arm  BP Method Automatic  Patient Position (if appropriate) Lying  Pulse Rate 90  Pulse Rate Source Monitor  ECG Heart Rate 89  Resp 14  Oxygen Therapy  SpO2 100 %  O2 Device Nasal Cannula  O2 Flow Rate (L/min) 3 L/min  Patient Activity (if Appropriate) In bed  Pulse Oximetry Type Continuous  During Treatment Monitoring  Blood Flow Rate (mL/min) 400 mL/min  Arterial Pressure (mmHg) -200 mmHg  Venous Pressure (mmHg) 210 mmHg  TMP (mmHg) 16 mmHg  Ultrafiltration Rate (mL/min) 1050 mL/min  Dialysate Flow Rate (mL/min) 300 ml/min  HD Safety Checks Performed Yes  Intra-Hemodialysis Comments Tx completed  Dialysis Fluid Bolus Normal Saline  Bolus Amount (mL) 300 mL  Post Treatment  Dialyzer Clearance Lightly streaked  Duration of HD Treatment -hour(s) 3.15 hour(s)  Liters Processed 78  Fluid Removed (mL) 3000 mL  Tolerated HD Treatment Yes  AVG/AVF Arterial Site Held (minutes) 7 minutes  AVG/AVF Venous Site Held (minutes) 7 minutes  Fistula / Graft Right Upper arm Arteriovenous vein graft  Placement Date: 10/31/22   Placed prior to admission: Yes  Orientation: Right  Access Location: Upper arm  Access Type: Arteriovenous vein graft  Site Condition No complications  Fistula / Graft Assessment Present;Thrill;Bruit  Status Deaccessed;Patent  Drainage Description None      [facility] Klahr [facility]

## 2023-01-17 NOTE — ED Notes (Signed)
Pt independent with home W/C

## 2023-01-17 NOTE — ED Notes (Signed)
Pt sitting in his WC with nasal O2 at 3 liters
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
## 2023-01-17 NOTE — ED Triage Notes (Signed)
Pt arrives requesting dialysis. No complaints otherwise, states he is feeling "pretty good" today.

## 2023-01-17 NOTE — ED Notes (Signed)
Pt to dialysis. Pt transferred independently from power chair to stretcher .

## 2023-01-19 ENCOUNTER — Emergency Department (HOSPITAL_COMMUNITY)
Admission: EM | Admit: 2023-01-19 | Discharge: 2023-01-19 | Disposition: A | Discharge status: Home / Self Care | Payer: 59 | Attending: Emergency Medicine | Admitting: Emergency Medicine

## 2023-01-19 DIAGNOSIS — Z79899 Other long term (current) drug therapy: Secondary | ICD-10-CM | POA: Insufficient documentation

## 2023-01-19 DIAGNOSIS — N186 End stage renal disease: Secondary | ICD-10-CM | POA: Insufficient documentation

## 2023-01-19 DIAGNOSIS — I509 Heart failure, unspecified: Secondary | ICD-10-CM | POA: Insufficient documentation

## 2023-01-19 DIAGNOSIS — I132 Hypertensive heart and chronic kidney disease with heart failure and with stage 5 chronic kidney disease, or end stage renal disease: Secondary | ICD-10-CM | POA: Insufficient documentation

## 2023-01-19 DIAGNOSIS — Z794 Long term (current) use of insulin: Secondary | ICD-10-CM | POA: Insufficient documentation

## 2023-01-19 DIAGNOSIS — J449 Chronic obstructive pulmonary disease, unspecified: Secondary | ICD-10-CM | POA: Diagnosis not present

## 2023-01-19 DIAGNOSIS — Z992 Dependence on renal dialysis: Secondary | ICD-10-CM | POA: Diagnosis not present

## 2023-01-19 DIAGNOSIS — E1122 Type 2 diabetes mellitus with diabetic chronic kidney disease: Secondary | ICD-10-CM | POA: Insufficient documentation

## 2023-01-19 DIAGNOSIS — Z7982 Long term (current) use of aspirin: Secondary | ICD-10-CM | POA: Insufficient documentation

## 2023-01-19 LAB — COMPREHENSIVE METABOLIC PANEL
ALT: 16 U/L (ref 0–44)
AST: 15 U/L (ref 15–41)
Albumin: 3.6 g/dL (ref 3.5–5.0)
Alkaline Phosphatase: 84 U/L (ref 38–126)
Anion gap: 17 — ABNORMAL HIGH (ref 5–15)
BUN: 91 mg/dL — ABNORMAL HIGH (ref 8–23)
CO2: 23 mmol/L (ref 22–32)
Calcium: 8.4 mg/dL — ABNORMAL LOW (ref 8.9–10.3)
Chloride: 93 mmol/L — ABNORMAL LOW (ref 98–111)
Creatinine, Ser: 5.73 mg/dL — ABNORMAL HIGH (ref 0.61–1.24)
GFR, Estimated: 10 mL/min — ABNORMAL LOW (ref >60)
Glucose, Bld: 258 mg/dL — ABNORMAL HIGH (ref 70–99)
Potassium: 4.2 mmol/L (ref 3.5–5.1)
Sodium: 133 mmol/L — ABNORMAL LOW (ref 135–145)
Total Bilirubin: 0.5 mg/dL (ref 0.3–1.2)
Total Protein: 7.2 g/dL (ref 6.5–8.1)

## 2023-01-19 LAB — CBC WITH DIFFERENTIAL/PLATELET
Abs Immature Granulocytes: 0.24 10*3/uL — ABNORMAL HIGH (ref 0.00–0.07)
Basophils Absolute: 0 10*3/uL (ref 0.0–0.1)
Basophils Relative: 0 %
Eosinophils Absolute: 0.2 10*3/uL (ref 0.0–0.5)
Eosinophils Relative: 2 %
HCT: 27.6 % — ABNORMAL LOW (ref 39.0–52.0)
Hemoglobin: 9.2 g/dL — ABNORMAL LOW (ref 13.0–17.0)
Immature Granulocytes: 3 %
Lymphocytes Relative: 10 %
Lymphs Abs: 0.9 10*3/uL (ref 0.7–4.0)
MCH: 28.9 pg (ref 26.0–34.0)
MCHC: 33.3 g/dL (ref 30.0–36.0)
MCV: 86.8 fL (ref 80.0–100.0)
Monocytes Absolute: 0.6 10*3/uL (ref 0.1–1.0)
Monocytes Relative: 7 %
Neutro Abs: 7.4 10*3/uL (ref 1.7–7.7)
Neutrophils Relative %: 78 %
Platelets: 120 10*3/uL — ABNORMAL LOW (ref 150–400)
RBC: 3.18 MIL/uL — ABNORMAL LOW (ref 4.22–5.81)
RDW: 14.1 % (ref 11.5–15.5)
WBC: 9.3 10*3/uL (ref 4.0–10.5)
nRBC: 0 % (ref 0.0–0.2)

## 2023-01-19 LAB — HEPATITIS B SURFACE ANTIGEN: Hepatitis B Surface Ag: NONREACTIVE

## 2023-01-19 MED ORDER — CHLORHEXIDINE GLUCONATE CLOTH 2 % EX PADS: 6.0000 | MEDICATED_PAD | Freq: Every day | CUTANEOUS | Status: DC

## 2023-01-19 MED ORDER — HEPARIN SODIUM (PORCINE) 1000 UNIT/ML DIALYSIS
2000.0000 [IU] | Freq: Once | INTRAMUSCULAR | Status: AC
  Administered 2023-01-19: 2000 [IU] via INTRAVENOUS_CENTRAL
  Filled 2023-01-19 (×2): qty 2
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
## 2023-01-19 NOTE — ED Triage Notes (Signed)
Pt here for dialysis

## 2023-01-19 NOTE — ED Notes (Signed)
Pt present to room from dialysis via stretcher. Pt A&Ox4, sitting upright in personal wheelchair.

## 2023-01-19 NOTE — Progress Notes (Signed)
POST TX NOTE  01/19/23 1441  Vitals  Temp 98.3 F (36.8 C)  Temp Source Oral  BP (!) 140/62  MAP (mmHg) 86  BP Location Left Arm  BP Method Automatic  Patient Position (if appropriate) Lying  Pulse Rate 89  Pulse Rate Source Monitor  ECG Heart Rate 91  Resp 20  Oxygen Therapy  SpO2 99 %  O2 Device Nasal Cannula  O2 Flow Rate (L/min) 3 L/min  Pulse Oximetry Type Continuous  During Treatment Monitoring  Intra-Hemodialysis Comments (S)   (post HD tx VS check)  Post Treatment  Dialyzer Clearance Lightly streaked  Duration of HD Treatment -hour(s) 2 hour(s)  Hemodialysis Intake (mL) 2.25 mL (2 hr 21 min)  Liters Processed 56.2  Fluid Removed (mL) 1400 mL (1525m (value from machine) - 10108mNS extra flush for cramping) = 14003m Tolerated HD Treatment No (Comment) (sever cramping)  Post-Hemodialysis Comments (S)  tx ended 54 min early d/t severe cramping, UF goal not met, blood rinsed back. Medication admin: Heparin 2000 units pre tx bolus  Fistula / Graft Right Upper arm Arteriovenous vein graft  Placement Date: 10/31/22   Placed prior to admission: Yes  Orientation: Right  Access Location: Upper arm  Access Type: Arteriovenous vein graft  Site Condition No complications  Fistula / Graft Assessment Bruit;Thrill;Present  Drainage Description None

## 2023-01-19 NOTE — ED Provider Notes (Signed)
Patient denies any use after dialysis.  He says he is ready to go home.  Was discharged home in good condition   Malvin Johns, MD 01/19/23 [phone]

## 2023-01-21 ENCOUNTER — Other Ambulatory Visit: Payer: Self-pay

## 2023-01-21 ENCOUNTER — Emergency Department (HOSPITAL_COMMUNITY)
Admission: EM | Admit: 2023-01-21 | Discharge: 2023-01-21 | Disposition: A | Discharge status: Home / Self Care | Payer: 59 | Attending: Emergency Medicine | Admitting: Emergency Medicine

## 2023-01-21 ENCOUNTER — Encounter (HOSPITAL_COMMUNITY): Payer: Self-pay

## 2023-01-21 DIAGNOSIS — E119 Type 2 diabetes mellitus without complications: Secondary | ICD-10-CM | POA: Insufficient documentation

## 2023-01-21 DIAGNOSIS — Z794 Long term (current) use of insulin: Secondary | ICD-10-CM | POA: Insufficient documentation

## 2023-01-21 DIAGNOSIS — J449 Chronic obstructive pulmonary disease, unspecified: Secondary | ICD-10-CM | POA: Insufficient documentation

## 2023-01-21 DIAGNOSIS — I509 Heart failure, unspecified: Secondary | ICD-10-CM | POA: Diagnosis not present

## 2023-01-21 DIAGNOSIS — Z7901 Long term (current) use of anticoagulants: Secondary | ICD-10-CM | POA: Diagnosis not present

## 2023-01-21 DIAGNOSIS — N189 Chronic kidney disease, unspecified: Secondary | ICD-10-CM | POA: Insufficient documentation

## 2023-01-21 DIAGNOSIS — Z7982 Long term (current) use of aspirin: Secondary | ICD-10-CM | POA: Diagnosis not present

## 2023-01-21 DIAGNOSIS — Z992 Dependence on renal dialysis: Secondary | ICD-10-CM | POA: Diagnosis present

## 2023-01-21 MED ORDER — CHLORHEXIDINE GLUCONATE CLOTH 2 % EX PADS: 6.0000 | MEDICATED_PAD | Freq: Every day | CUTANEOUS | Status: DC

## 2023-01-21 MED ORDER — PREGABALIN 75 MG PO CAPS: 75.0000 mg | ORAL_CAPSULE | ORAL | 0 refills | Status: DC

## 2023-01-21 NOTE — ED Triage Notes (Signed)
Pt is here to get dialysis like he's been doing, because he doesn't have a dialysis ctr. Pt denies any complaints

## 2023-01-21 NOTE — Progress Notes (Signed)
Received patient in bed to unit.  Alert and oriented.  Informed consent signed and in chart.   TX duration:3.25  Patient tolerated well.  Transported back to the room  Alert, without acute distress.  Hand-off given to patient's nurse.   Access used: AVG  Access issues: none  Total UF removed: 1.5 L Medication(s) given: none Post HD VS: 160/72 P 88 r 16. O2 sat 100% 3 l O2 in using. Post HD weight: 138kg   [facility]

## 2023-01-21 NOTE — ED Provider Notes (Addendum)
[facility] Provider Note   CSN: [id number] Arrival date & time: 01/21/23  [id number]     History  Chief Complaint  Patient presents with   needs dialysis    Jeffrey [name] is a 62 y.o. male.  HPI   Patient has a history of mood disorder, COPD on chronic oxygen, diabetes, status post lower extremity amputation, chronic renal failure on dialysis, CHF who presents to the ED for routine dialysis.  Patient has been discharged from his outpatient dialysis centers.  In the interim he has been coming to the emergency room to be dialyzed at the hospital until a new outpatient center can be established.  Patient denies any acute complaints today.  He states while he is here, he is running out of his Lyrica and his Valium.  He states he needs to make an appointment with his primary care doctor.  Home Medications Prior to Admission medications   Medication Sig Start Date End Date Taking? Authorizing Provider  amLODipine (NORVASC) 10 MG tablet Take 10 mg by mouth every morning. 04/17/20   [provider]  ASPIRIN LOW DOSE 81 MG EC tablet Take 81 mg by mouth in the morning. 02/22/21   [provider]  atorvastatin (LIPITOR) 40 MG tablet Take 40 mg by mouth in the morning. 02/09/21   [provider]  calcium acetate (PHOSLO) 667 MG capsule Take 2 capsules (1,334 mg total) by mouth with breakfast, with lunch, and with evening meal. 04/10/21   Debbe Odea, MD  carvedilol (COREG) 12.5 MG tablet Take 12.5 mg by mouth 2 (two) times daily with a meal. 04/17/20   [provider]  Cholecalciferol (VITAMIN D) 50 MCG (2000 UT) CAPS Take 2,000 Units by mouth daily.    [provider]  clopidogrel (PLAVIX) 75 MG tablet Take 1 tablet (75 mg total) by mouth daily. 11/02/22   Rhyne, Hulen Shouts, PA-C  cyclobenzaprine (FLEXERIL) 10 MG tablet Take 10 mg by mouth 3 (three) times daily as needed for muscle spasms. 12/29/21   [provider]  diazepam (VALIUM) 5 MG tablet Take 5 mg by mouth 2 (two) times daily as needed for muscle spasms. 12/09/21   [provider]  diphenhydrAMINE (BENADRYL) 25 MG tablet Take 50 mg by mouth every 6 (six) hours as needed for itching.    [provider]  insulin regular human CONCENTRATED (HUMULIN R U-500 KWIKPEN) 500 UNIT/ML KwikPen Inject 45 Units into the skin daily with supper. Patient taking differently: Inject 140 Units into the skin 2 (two) times daily with a meal. 09/04/21 08/30/24  [name], MD  metolazone (ZAROXOLYN) 10 MG tablet Take 10 mg by mouth daily. 07/13/21   [provider]  nortriptyline (PAMELOR) 10 MG capsule Take 20 mg by mouth at bedtime. 10/23/19 08/30/24  [provider]  oxyCODONE (ROXICODONE) 5 MG immediate release tablet Take 1 tablet (5 mg total) by mouth every 8 (eight) hours as needed. 12/31/22 12/31/23  [place] [name], DO  oxymetazoline (AFRIN) 0.05 % nasal spray Place 1 spray into both nostrils daily as needed (nose bleeds).    [provider]  pantoprazole (PROTONIX) 40 MG tablet Take 40 mg by mouth daily before breakfast. 02/09/21   [provider]  pregabalin (LYRICA) 75 MG capsule Take 1 capsule (75 mg total) by mouth See admin instructions. Daily.  Give after dialysis on dialysis days. 01/21/23   [name], MD  sertraline (ZOLOFT) 100 MG  tablet Take 100 mg by mouth in the morning. 02/22/21   [provider]  sodium chloride (OCEAN) 0.65 % SOLN nasal spray Place 2 sprays into both nostrils in the morning and at bedtime.    [provider]  tadalafil (CIALIS) 20 MG tablet Take 20 mg by mouth daily as needed for erectile dysfunction.    [provider]  tamsulosin (FLOMAX) 0.4 MG CAPS capsule Take 1 capsule (0.4 mg total) by mouth daily. 11/10/21   Darliss Cheney, MD  torsemide (DEMADEX) 20 MG tablet Take 120 mg by mouth 2 (two) times daily.    [provider]   traZODone (DESYREL) 150 MG tablet Take 150 mg by mouth at bedtime. 12/09/21   [provider]      Allergies    Actos [pioglitazone], Dexmedetomidine, Ibuprofen, Tomato, and Wellbutrin [bupropion]    Review of Systems   Review of Systems  Physical Exam Updated Vital Signs BP (!) 162/70   Pulse 94   Temp 98.2 F (36.8 C) (Oral)   Resp 20   Ht 1.651 m ('5\' 5"'$ )   Wt 133 kg   SpO2 96%   BMI 48.79 kg/m  Physical Exam Vitals and nursing note reviewed.  Constitutional:      General: He is not in acute distress.    Appearance: He is well-developed. He is obese. He is not diaphoretic.     Comments: Patient uses a motorized wheelchair  HENT:     Head: Normocephalic and atraumatic.     Right Ear: External ear normal.     Left Ear: External ear normal.  Eyes:     General: No scleral icterus.       Right eye: No discharge.        Left eye: No discharge.     Conjunctiva/sclera: Conjunctivae normal.  Neck:     Trachea: No tracheal deviation.  Cardiovascular:     Rate and Rhythm: Normal rate.  Pulmonary:     Effort: Pulmonary effort is normal. No respiratory distress.     Breath sounds: No stridor.  Abdominal:     General: There is no distension.  Musculoskeletal:     Cervical back: Neck supple.  Skin:    General: Skin is warm and dry.     Findings: No rash.  Neurological:     Mental Status: He is alert. Mental status is at baseline.     Cranial Nerves: No dysarthria or facial asymmetry.     Motor: No seizure activity.     ED Results / Procedures / Treatments   Labs (all labs ordered are listed, but only abnormal results are displayed) Labs Reviewed - No data to display  EKG None  Radiology No results found.  Procedures Procedures    Medications Ordered in ED Medications - No data to display  ED Course/ Medical Decision Making/ A&P Clinical Course as of 01/21/23 [id number]  Sat Jan 21, 2023  0825 Case discussed with Dr. Jonnie Finner.  He will take the  patient for dialysis [JK]    Clinical Course User Index [JK] [name], MD                             Medical Decision Making Problems Addressed: Chronic renal failure, unspecified CKD stage: chronic illness or injury  Risk Prescription drug management.   Patient denies any acute complaints.  Will give him a refill of his Lyrica.  [place]plane to him  with Valium being a controlled substance that should come from his primary care doctor.  Recommend he call them this week.  I will consult with nephrology regarding his request for dialysis.  Anticipate patient will be discharged after dialysis later today        Final Clinical Impression(s) / ED Diagnoses Final diagnoses:  Chronic renal failure, unspecified CKD stage    Rx / DC Orders ED Discharge Orders          Ordered    pregabalin (LYRICA) 75 MG capsule  See admin instructions        01/21/23 0801              [name], MD 01/21/23 [phone] Patient reexamined after dialysis.  He states he is feeling fine and is ready for discharge   [name], MD 01/21/23 1417

## 2023-01-21 NOTE — Procedures (Signed)
We are asked to do dialysis on this patient who was discharged from his OP HD unit due to behavioral issues. Pt is here in ED requesting hospital HD. Plan is for HD upstairs then return to ED for reassessment and expected discharge home.      Last OP HD orders:  3:15h  129kg  RUE AVG   Heparin 2000     I was present at this dialysis session, have reviewed the session itself and made  appropriate changes Kelly Splinter MD [place] pager [phone]   01/21/2023, 12:47 PM     Last Labs       Recent Labs  Lab 01/17/23 0900 01/19/23 0832 01/19/23 1214  HGB 9.4*  --  9.2*  ALBUMIN 3.6 3.6  --   CALCIUM 8.5* 8.4*  --   PHOS 4.1  --   --   CREATININE 5.78* 5.73*  --   K 3.6 4.2  --         Inpatient medications:  Chlorhexidine Gluconate Cloth  6 each Topical Q0600

## 2023-01-21 NOTE — Discharge Instructions (Signed)
Follow-up with your primary care doctor as we discussed.  Continue with dialysis as recommended per the nephrology team

## 2023-01-24 ENCOUNTER — Other Ambulatory Visit: Payer: Self-pay

## 2023-01-24 ENCOUNTER — Emergency Department (HOSPITAL_COMMUNITY)
Admission: EM | Admit: 2023-01-24 | Discharge: 2023-01-24 | Disposition: A | Discharge status: Home / Self Care | Payer: 59 | Attending: Emergency Medicine | Admitting: Emergency Medicine

## 2023-01-24 DIAGNOSIS — N186 End stage renal disease: Secondary | ICD-10-CM

## 2023-01-24 DIAGNOSIS — R140 Abdominal distension (gaseous): Secondary | ICD-10-CM | POA: Insufficient documentation

## 2023-01-24 DIAGNOSIS — R0600 Dyspnea, unspecified: Secondary | ICD-10-CM | POA: Insufficient documentation

## 2023-01-24 DIAGNOSIS — Z7982 Long term (current) use of aspirin: Secondary | ICD-10-CM | POA: Diagnosis not present

## 2023-01-24 DIAGNOSIS — J449 Chronic obstructive pulmonary disease, unspecified: Secondary | ICD-10-CM | POA: Diagnosis not present

## 2023-01-24 DIAGNOSIS — Z992 Dependence on renal dialysis: Secondary | ICD-10-CM

## 2023-01-24 LAB — RENAL FUNCTION PANEL
Albumin: 3.5 g/dL (ref 3.5–5.0)
Anion gap: 15 (ref 5–15)
BUN: 81 mg/dL — ABNORMAL HIGH (ref 8–23)
CO2: 29 mmol/L (ref 22–32)
Calcium: 8.4 mg/dL — ABNORMAL LOW (ref 8.9–10.3)
Chloride: 93 mmol/L — ABNORMAL LOW (ref 98–111)
Creatinine, Ser: 5.69 mg/dL — ABNORMAL HIGH (ref 0.61–1.24)
GFR, Estimated: 11 mL/min — ABNORMAL LOW (ref >60)
Glucose, Bld: 210 mg/dL — ABNORMAL HIGH (ref 70–99)
Phosphorus: 4.8 mg/dL — ABNORMAL HIGH (ref 2.5–4.6)
Potassium: 3.9 mmol/L (ref 3.5–5.1)
Sodium: 137 mmol/L (ref 135–145)

## 2023-01-24 LAB — CBC
HCT: 27.3 % — ABNORMAL LOW (ref 39.0–52.0)
Hemoglobin: 9 g/dL — ABNORMAL LOW (ref 13.0–17.0)
MCH: 29 pg (ref 26.0–34.0)
MCHC: 33 g/dL (ref 30.0–36.0)
MCV: 88.1 fL (ref 80.0–100.0)
Platelets: 117 10*3/uL — ABNORMAL LOW (ref 150–400)
RBC: 3.1 MIL/uL — ABNORMAL LOW (ref 4.22–5.81)
RDW: 14.3 % (ref 11.5–15.5)
WBC: 7.7 10*3/uL (ref 4.0–10.5)
nRBC: 0 % (ref 0.0–0.2)

## 2023-01-24 LAB — HEPATITIS B SURFACE ANTIGEN: Hepatitis B Surface Ag: NONREACTIVE

## 2023-01-24 LAB — IRON AND TIBC
Iron: 55 ug/dL (ref 45–182)
Saturation Ratios: 22 % (ref 17.9–39.5)
TIBC: 255 ug/dL (ref 250–450)
UIBC: 200 ug/dL

## 2023-01-24 LAB — FERRITIN: Ferritin: 935 ng/mL — ABNORMAL HIGH (ref 24–336)

## 2023-01-24 MED ORDER — CHLORHEXIDINE GLUCONATE CLOTH 2 % EX PADS: 6.0000 | MEDICATED_PAD | Freq: Every day | CUTANEOUS | Status: DC

## 2023-01-24 MED ORDER — HEPARIN SODIUM (PORCINE) 1000 UNIT/ML DIALYSIS: 1000.0000 [IU] | INTRAMUSCULAR | Status: DC | PRN

## 2023-01-24 MED ORDER — LIDOCAINE HCL (PF) 1 % IJ SOLN: 5.0000 mL | INTRAMUSCULAR | Status: DC | PRN

## 2023-01-24 MED ORDER — PENTAFLUOROPROP-TETRAFLUOROETH EX AERO: 1.0000 | INHALATION_SPRAY | CUTANEOUS | Status: DC | PRN

## 2023-01-24 MED ORDER — ALTEPLASE 2 MG IJ SOLR: 2.0000 mg | Freq: Once | INTRAMUSCULAR | Status: DC | PRN

## 2023-01-24 MED ORDER — LIDOCAINE-PRILOCAINE 2.5-2.5 % EX CREA: 1.0000 | TOPICAL_CREAM | CUTANEOUS | Status: DC | PRN

## 2023-01-24 MED ORDER — ANTICOAGULANT SODIUM CITRATE 4% (200MG/5ML) IV SOLN: 5.0000 mL | Status: DC | PRN

## 2023-01-24 MED ORDER — HEPARIN SODIUM (PORCINE) 1000 UNIT/ML DIALYSIS
2000.0000 [IU] | Freq: Once | INTRAMUSCULAR | Status: AC
  Administered 2023-01-24: 2000 [IU] via INTRAVENOUS_CENTRAL

## 2023-01-24 NOTE — ED Notes (Addendum)
Consent signed and at bedside. Report given to Dialysis.

## 2023-01-24 NOTE — ED Notes (Signed)
Pt transported to dialysis

## 2023-01-24 NOTE — ED Provider Notes (Signed)
[facility] Provider Note   CSN: [id number] Arrival date & time: 01/24/23  [id number]     History  No chief complaint on file.   Jeffrey [name] is a 62 y.o. male.  HPI   62 year old male end-stage renal disease, on oxygen history of COPD, presents today for dialysis.  He states he is normally dialyzed Tuesday Thursday Saturday with last dialysis on Saturday.  He has some dyspnea and feels that he has had some increased fluid consistent with normal dialysis cycle.  He is not in any distress.  Home Medications Prior to Admission medications   Medication Sig Start Date End Date Taking? Authorizing Provider  amLODipine (NORVASC) 10 MG tablet Take 10 mg by mouth every morning. 04/17/20   [provider]  ASPIRIN LOW DOSE 81 MG EC tablet Take 81 mg by mouth in the morning. 02/22/21   [provider]  atorvastatin (LIPITOR) 40 MG tablet Take 40 mg by mouth in the morning. 02/09/21   [provider]  calcium acetate (PHOSLO) 667 MG capsule Take 2 capsules (1,334 mg total) by mouth with breakfast, with lunch, and with evening meal. 04/10/21   Debbe Odea, MD  carvedilol (COREG) 12.5 MG tablet Take 12.5 mg by mouth 2 (two) times daily with a meal. 04/17/20   [provider]  Cholecalciferol (VITAMIN D) 50 MCG (2000 UT) CAPS Take 2,000 Units by mouth daily.    [provider]  clopidogrel (PLAVIX) 75 MG tablet Take 1 tablet (75 mg total) by mouth daily. 11/02/22   Rhyne, Hulen Shouts, PA-C  cyclobenzaprine (FLEXERIL) 10 MG tablet Take 10 mg by mouth 3 (three) times daily as needed for muscle spasms. 12/29/21   [provider]  diazepam (VALIUM) 5 MG tablet Take 5 mg by mouth 2 (two) times daily as needed for muscle spasms. 12/09/21   [provider]  diphenhydrAMINE (BENADRYL) 25 MG tablet Take 50 mg by mouth every 6 (six) hours as needed for itching.    [provider]  insulin regular human  CONCENTRATED (HUMULIN R U-500 KWIKPEN) 500 UNIT/ML KwikPen Inject 45 Units into the skin daily with supper. Patient taking differently: Inject 140 Units into the skin 2 (two) times daily with a meal. 09/04/21 08/30/24  [name], MD  metolazone (ZAROXOLYN) 10 MG tablet Take 10 mg by mouth daily. 07/13/21   [provider]  nortriptyline (PAMELOR) 10 MG capsule Take 20 mg by mouth at bedtime. 10/23/19 08/30/24  [provider]  oxyCODONE (ROXICODONE) 5 MG immediate release tablet Take 1 tablet (5 mg total) by mouth every 8 (eight) hours as needed. 12/31/22 12/31/23  [place] [name], DO  oxymetazoline (AFRIN) 0.05 % nasal spray Place 1 spray into both nostrils daily as needed (nose bleeds).    [provider]  pantoprazole (PROTONIX) 40 MG tablet Take 40 mg by mouth daily before breakfast. 02/09/21   [provider]  pregabalin (LYRICA) 75 MG capsule Take 1 capsule (75 mg total) by mouth See admin instructions. Daily.  Give after dialysis on dialysis days. 01/21/23   [name], MD  sertraline (ZOLOFT) 100 MG tablet Take 100 mg by mouth in the morning. 02/22/21   [provider]  sodium chloride (OCEAN) 0.65 % SOLN nasal spray Place 2 sprays into both nostrils in the morning and at bedtime.    [provider]  tadalafil (CIALIS) 20 MG tablet Take 20 mg by mouth daily as needed  for erectile dysfunction.    [provider]  tamsulosin (FLOMAX) 0.4 MG CAPS capsule Take 1 capsule (0.4 mg total) by mouth daily. 11/10/21   Darliss Cheney, MD  torsemide (DEMADEX) 20 MG tablet Take 120 mg by mouth 2 (two) times daily.    [provider]  traZODone (DESYREL) 150 MG tablet Take 150 mg by mouth at bedtime. 12/09/21   [provider]      Allergies    Actos [pioglitazone], Dexmedetomidine, Ibuprofen, Tomato, and Wellbutrin [bupropion]    Review of Systems   Review of Systems  Physical Exam Updated Vital Signs BP (!) 154/59 (BP  Location: Right Arm)   Pulse 91   Temp 98.9 F (37.2 C) (Oral)   Resp 18   SpO2 95%  Physical Exam Vitals reviewed.  Constitutional:      General: He is not in acute distress.    Appearance: Normal appearance. He is obese. He is not ill-appearing.  HENT:     Head: Normocephalic and atraumatic.     Right Ear: External ear normal.     Left Ear: External ear normal.     Nose: Nose normal.     Mouth/Throat:     Pharynx: Oropharynx is clear.  Eyes:     Pupils: Pupils are equal, round, and reactive to light.  Cardiovascular:     Rate and Rhythm: Normal rate and regular rhythm.  Pulmonary:     Effort: Pulmonary effort is normal.  Abdominal:     General: There is distension.     Palpations: Abdomen is soft.  Musculoskeletal:     Cervical back: Normal range of motion.     Comments: Left lower extremity amputation Access rue  Skin:    General: Skin is warm and dry.     Capillary Refill: Capillary refill takes less than 2 seconds.  Neurological:     General: No focal deficit present.     Mental Status: He is alert.  Psychiatric:        Mood and Affect: Mood normal.     ED Results / Procedures / Treatments   Labs (all labs ordered are listed, but only abnormal results are displayed) Labs Reviewed - No data to display  EKG None  Radiology No results found.  Procedures Procedures    Medications Ordered in ED Medications - No data to display  ED Course/ Medical Decision Making/ A&P                             Medical Decision Making  Discussed care with Dr. Candiss Norse, on for nephrology and he will initiate dialysis orders        Final Clinical Impression(s) / ED Diagnoses Final diagnoses:  ESRD (end stage renal disease) [facility])  Dialysis patient [facility])    Rx / DC Orders ED Discharge Orders     None         Pattricia Boss, MD 01/24/23 1601

## 2023-01-24 NOTE — Procedures (Signed)
I was present at this dialysis session. I have reviewed the session itself and made appropriate changes.   Filed Weights    Recent Labs  Lab 01/24/23 0937  NA 137  K 3.9  CL 93*  CO2 29  GLUCOSE 210*  BUN 81*  CREATININE 5.69*  CALCIUM 8.4*  PHOS 4.8*    Recent Labs  Lab 01/19/23 1214 01/24/23 1035  WBC 9.3 7.7  NEUTROABS 7.4  --   HGB 9.2* 9.0*  HCT 27.6* 27.3*  MCV 86.8 88.1  PLT 120* 117*    Scheduled Meds:  Chlorhexidine Gluconate Cloth  6 each Topical Q0600   Continuous Infusions:  anticoagulant sodium citrate     PRN Meds:.alteplase, anticoagulant sodium citrate, heparin, lidocaine (PF), lidocaine-prilocaine, pentafluoroprop-tetrafluoroeth   Gean Quint, MD [facility] 01/24/2023, 2:31 PM

## 2023-01-24 NOTE — Progress Notes (Signed)
Asked to see patient for routine HD. D/c'ed from outpatient HD unit for behavioral issues. Last HD Sat here. Plan is to do HD today and possible d/c to home after reassessment from ER MD. Hep B surface Ag neg 01/19/23 Will check RFP, CBC, PTH, iron panel with HD  Last OP HD orders:  3:15h  129kg  RUE AVG   Heparin 2000   Please call with any questions/concerns in the meantime.  Gean Quint, MD [facility]

## 2023-01-24 NOTE — ED Triage Notes (Signed)
Pt here for routine dialysis tx. Had full tx Saturday.

## 2023-01-26 ENCOUNTER — Emergency Department (HOSPITAL_COMMUNITY)
Admission: EM | Admit: 2023-01-26 | Discharge: 2023-01-26 | Disposition: A | Discharge status: Home / Self Care | Payer: 59 | Attending: Emergency Medicine | Admitting: Emergency Medicine

## 2023-01-26 ENCOUNTER — Other Ambulatory Visit: Payer: Self-pay

## 2023-01-26 DIAGNOSIS — J449 Chronic obstructive pulmonary disease, unspecified: Secondary | ICD-10-CM | POA: Insufficient documentation

## 2023-01-26 DIAGNOSIS — Z7982 Long term (current) use of aspirin: Secondary | ICD-10-CM | POA: Diagnosis not present

## 2023-01-26 DIAGNOSIS — Z794 Long term (current) use of insulin: Secondary | ICD-10-CM | POA: Diagnosis not present

## 2023-01-26 DIAGNOSIS — E1122 Type 2 diabetes mellitus with diabetic chronic kidney disease: Secondary | ICD-10-CM | POA: Diagnosis not present

## 2023-01-26 DIAGNOSIS — N186 End stage renal disease: Secondary | ICD-10-CM | POA: Insufficient documentation

## 2023-01-26 DIAGNOSIS — I120 Hypertensive chronic kidney disease with stage 5 chronic kidney disease or end stage renal disease: Secondary | ICD-10-CM | POA: Insufficient documentation

## 2023-01-26 DIAGNOSIS — Z992 Dependence on renal dialysis: Secondary | ICD-10-CM | POA: Diagnosis present

## 2023-01-26 DIAGNOSIS — Z79899 Other long term (current) drug therapy: Secondary | ICD-10-CM | POA: Insufficient documentation

## 2023-01-26 LAB — BASIC METABOLIC PANEL
Anion gap: 16 — ABNORMAL HIGH (ref 5–15)
BUN: 63 mg/dL — ABNORMAL HIGH (ref 8–23)
CO2: 24 mmol/L (ref 22–32)
Calcium: 8.3 mg/dL — ABNORMAL LOW (ref 8.9–10.3)
Chloride: 94 mmol/L — ABNORMAL LOW (ref 98–111)
Creatinine, Ser: 5.47 mg/dL — ABNORMAL HIGH (ref 0.61–1.24)
GFR, Estimated: 11 mL/min — ABNORMAL LOW (ref >60)
Glucose, Bld: 227 mg/dL — ABNORMAL HIGH (ref 70–99)
Potassium: 3.8 mmol/L (ref 3.5–5.1)
Sodium: 134 mmol/L — ABNORMAL LOW (ref 135–145)

## 2023-01-26 LAB — CBC WITH DIFFERENTIAL/PLATELET
Abs Immature Granulocytes: 0.13 10*3/uL — ABNORMAL HIGH (ref 0.00–0.07)
Basophils Absolute: 0 10*3/uL (ref 0.0–0.1)
Basophils Relative: 0 %
Eosinophils Absolute: 0.2 10*3/uL (ref 0.0–0.5)
Eosinophils Relative: 2 %
HCT: 28.2 % — ABNORMAL LOW (ref 39.0–52.0)
Hemoglobin: 9 g/dL — ABNORMAL LOW (ref 13.0–17.0)
Immature Granulocytes: 2 %
Lymphocytes Relative: 10 %
Lymphs Abs: 0.9 10*3/uL (ref 0.7–4.0)
MCH: 28.7 pg (ref 26.0–34.0)
MCHC: 31.9 g/dL (ref 30.0–36.0)
MCV: 89.8 fL (ref 80.0–100.0)
Monocytes Absolute: 0.5 10*3/uL (ref 0.1–1.0)
Monocytes Relative: 6 %
Neutro Abs: 6.7 10*3/uL (ref 1.7–7.7)
Neutrophils Relative %: 80 %
Platelets: 115 10*3/uL — ABNORMAL LOW (ref 150–400)
RBC: 3.14 MIL/uL — ABNORMAL LOW (ref 4.22–5.81)
RDW: 14.4 % (ref 11.5–15.5)
WBC: 8.4 10*3/uL (ref 4.0–10.5)
nRBC: 0 % (ref 0.0–0.2)

## 2023-01-26 LAB — HEPATITIS B SURFACE ANTIGEN: Hepatitis B Surface Ag: NONREACTIVE

## 2023-01-26 LAB — HEPATITIS B SURFACE ANTIBODY, QUANTITATIVE: Hep B S AB Quant (Post): 25.6 m[IU]/mL (ref >9.9)

## 2023-01-26 MED ORDER — ANTICOAGULANT SODIUM CITRATE 4% (200MG/5ML) IV SOLN: 5.0000 mL | Status: DC | PRN

## 2023-01-26 MED ORDER — HEPARIN SODIUM (PORCINE) 1000 UNIT/ML DIALYSIS: 1000.0000 [IU] | INTRAMUSCULAR | Status: DC | PRN

## 2023-01-26 MED ORDER — LIDOCAINE-PRILOCAINE 2.5-2.5 % EX CREA: 1.0000 | TOPICAL_CREAM | CUTANEOUS | Status: DC | PRN

## 2023-01-26 MED ORDER — ALTEPLASE 2 MG IJ SOLR: 2.0000 mg | Freq: Once | INTRAMUSCULAR | Status: DC | PRN

## 2023-01-26 MED ORDER — PENTAFLUOROPROP-TETRAFLUOROETH EX AERO: 1.0000 | INHALATION_SPRAY | CUTANEOUS | Status: DC | PRN

## 2023-01-26 MED ORDER — LIDOCAINE HCL (PF) 1 % IJ SOLN: 5.0000 mL | INTRAMUSCULAR | Status: DC | PRN

## 2023-01-26 MED ORDER — CHLORHEXIDINE GLUCONATE CLOTH 2 % EX PADS: 6.0000 | MEDICATED_PAD | Freq: Every day | CUTANEOUS | Status: DC

## 2023-01-26 MED ORDER — DARBEPOETIN ALFA 100 MCG/0.5ML IJ SOSY: 100.0000 ug | PREFILLED_SYRINGE | INTRAMUSCULAR | Status: DC

## 2023-01-26 NOTE — Progress Notes (Addendum)
Patient here for routine HD. D/c'ed from outpatient HD unit for behavioral issues. Last HD Tues here. Plan is to do HD today and possible d/c to home after reassessment from ER provider. Labs from 2/27 reviewed. Hgb 9. Aranesp 173mg start 2/29.  Will be seen once on HD if able. Please call with any questions/concerns in the interim.  VGean Quint MD [facility]

## 2023-01-26 NOTE — Discharge Instructions (Signed)
You were seen in the emergency department for your dialysis.  You had no complications.  You should continue to have your regular dialysis sessions every Tuesday, Thursday and Saturday and can follow-up with your primary doctor or nephrologist as needed.  You should return to the emergency department sooner if you are having shortness of breath, severe chest pain, you pass out or if you have any other new or concerning symptoms.

## 2023-01-26 NOTE — ED Provider Notes (Signed)
[facility] Provider Note   CSN: [id number] Arrival date & time: 01/26/23  [id number]     History  Chief Complaint  Patient presents with   Vascular Access Problem    Jeffrey [name] is a 62 y.o. male. He is back in the ED after uneventful dialysis session. No longer able to do outpatient dialysis, due to behavioral issues at his center per prior charts. He denies complaints  HPI     Home Medications Prior to Admission medications   Medication Sig Start Date End Date Taking? Authorizing Provider  amLODipine (NORVASC) 10 MG tablet Take 10 mg by mouth every morning. 04/17/20   [provider]  ASPIRIN LOW DOSE 81 MG EC tablet Take 81 mg by mouth in the morning. 02/22/21   [provider]  atorvastatin (LIPITOR) 40 MG tablet Take 40 mg by mouth in the morning. 02/09/21   [provider]  calcium acetate (PHOSLO) 667 MG capsule Take 2 capsules (1,334 mg total) by mouth with breakfast, with lunch, and with evening meal. 04/10/21   Debbe Odea, MD  carvedilol (COREG) 12.5 MG tablet Take 12.5 mg by mouth 2 (two) times daily with a meal. 04/17/20   [provider]  Cholecalciferol (VITAMIN D) 50 MCG (2000 UT) CAPS Take 2,000 Units by mouth daily.    [provider]  clopidogrel (PLAVIX) 75 MG tablet Take 1 tablet (75 mg total) by mouth daily. 11/02/22   Rhyne, Hulen Shouts, PA-C  cyclobenzaprine (FLEXERIL) 10 MG tablet Take 10 mg by mouth 3 (three) times daily as needed for muscle spasms. 12/29/21   [provider]  diazepam (VALIUM) 5 MG tablet Take 5 mg by mouth 2 (two) times daily as needed for muscle spasms. 12/09/21   [provider]  diphenhydrAMINE (BENADRYL) 25 MG tablet Take 50 mg by mouth every 6 (six) hours as needed for itching.    [provider]  insulin regular human CONCENTRATED (HUMULIN R U-500 KWIKPEN) 500 UNIT/ML KwikPen Inject 45 Units into the skin daily with  supper. Patient taking differently: Inject 140 Units into the skin 2 (two) times daily with a meal. 09/04/21 08/30/24  [name], MD  metolazone (ZAROXOLYN) 10 MG tablet Take 10 mg by mouth daily. 07/13/21   [provider]  nortriptyline (PAMELOR) 10 MG capsule Take 20 mg by mouth at bedtime. 10/23/19 08/30/24  [provider]  oxyCODONE (ROXICODONE) 5 MG immediate release tablet Take 1 tablet (5 mg total) by mouth every 8 (eight) hours as needed. 12/31/22 12/31/23  [place] [name], DO  oxymetazoline (AFRIN) 0.05 % nasal spray Place 1 spray into both nostrils daily as needed (nose bleeds).    [provider]  pantoprazole (PROTONIX) 40 MG tablet Take 40 mg by mouth daily before breakfast. 02/09/21   [provider]  pregabalin (LYRICA) 75 MG capsule Take 1 capsule (75 mg total) by mouth See admin instructions. Daily.  Give after dialysis on dialysis days. 01/21/23   [name], MD  sertraline (ZOLOFT) 100 MG tablet Take 100 mg by mouth in the morning. 02/22/21   [provider]  sodium chloride (OCEAN) 0.65 % SOLN nasal spray Place 2 sprays into both nostrils in the morning and at bedtime.    [provider]  tadalafil (CIALIS) 20 MG tablet Take 20 mg by mouth daily as needed for erectile dysfunction.    [provider]  tamsulosin (FLOMAX) 0.4 MG CAPS capsule Take  1 capsule (0.4 mg total) by mouth daily. 11/10/21   Darliss Cheney, MD  torsemide (DEMADEX) 20 MG tablet Take 120 mg by mouth 2 (two) times daily.    [provider]  traZODone (DESYREL) 150 MG tablet Take 150 mg by mouth at bedtime. 12/09/21   [provider]      Allergies    Actos [pioglitazone], Dexmedetomidine, Ibuprofen, Tomato, and Wellbutrin [bupropion]    Review of Systems   Review of Systems  Physical Exam Updated Vital Signs BP 127/73   Pulse 93   Temp 98.2 F (36.8 C) (Oral)   Resp 12   Ht '5\' 5"'$  (1.651 m)   Wt 131 kg   SpO2 100%   BMI  48.06 kg/m  Physical Exam Constitutional:      Appearance: Normal appearance.  HENT:     Head: Normocephalic.     Mouth/Throat:     Mouth: Mucous membranes are moist.  Cardiovascular:     Rate and Rhythm: Normal rate.  Pulmonary:     Effort: No respiratory distress.  Musculoskeletal:     Cervical back: Normal range of motion.  Skin:    General: Skin is warm and dry.  Neurological:     General: No focal deficit present.     Mental Status: He is alert and oriented to person, place, and time.     ED Results / Procedures / Treatments   Labs (all labs ordered are listed, but only abnormal results are displayed) Labs Reviewed  CBC WITH DIFFERENTIAL/PLATELET - Abnormal; Notable for the following components:      Result Value   RBC 3.14 (*)    Hemoglobin 9.0 (*)    HCT 28.2 (*)    Platelets 115 (*)    Abs Immature Granulocytes 0.13 (*)    All other components within normal limits  BASIC METABOLIC PANEL - Abnormal; Notable for the following components:   Sodium 134 (*)    Chloride 94 (*)    Glucose, Bld 227 (*)    BUN 63 (*)    Creatinine, Ser 5.47 (*)    Calcium 8.3 (*)    GFR, Estimated 11 (*)    Anion gap 16 (*)    All other components within normal limits  HEPATITIS B SURFACE ANTIGEN  HEPATITIS B SURFACE ANTIBODY, QUANTITATIVE    EKG None  Radiology No results found.  Procedures Procedures    Medications Ordered in ED Medications  Chlorhexidine Gluconate Cloth 2 % PADS 6 each (has no administration in time range)  Darbepoetin Alfa (ARANESP) injection 100 mcg (has no administration in time range)    ED Course/ Medical Decision Making/ A&P Clinical Course as of 01/26/23 1651  Thu Jan 26, 2023  1606 Care transitioned to provider default for reassessment after HD. [VK]    Clinical Course User Index [VK] Kemper Durie, DO                             Medical Decision Making Was here for dialysis, he completed with no complaints, instructed to  continue coming Tuesday Thursday Saturday for his routine dialysis.  He is agreeable and wants to go as his ride is ready outside.  Amount and/or Complexity of Data Reviewed Labs: ordered.           Final Clinical Impression(s) / ED Diagnoses Final diagnoses:  ESRD (end stage renal disease) on dialysis ([place])    Rx / DC  Orders ED Discharge Orders     None         Darci Current 01/26/23 1713    Valarie Merino, MD 01/27/23 [phone]

## 2023-01-26 NOTE — ED Provider Notes (Signed)
[facility] Provider Note   CSN: [id number] Arrival date & time: 01/26/23  [id number]     History  Chief Complaint  Patient presents with   Vascular Access Problem    Jeffrey [name] is a 62 y.o. male.  Patient is a 61 year old male with a past medical history of ESRD with right upper extremity fistula on TTS HD, COPD on 3 L home O2, hypertension, diabetes presenting to the emergency department for dialysis.  Patient does not currently have a home dialysis center and comes to the emergency department for regular dialysis sessions.  He states his last dialysis was Tuesday and he completed the session without any complications.  He states that he has been doing well since then with no increase in shortness of breath or lower extremity swelling.  The history is provided by the patient.       Home Medications Prior to Admission medications   Medication Sig Start Date End Date Taking? Authorizing Provider  amLODipine (NORVASC) 10 MG tablet Take 10 mg by mouth every morning. 04/17/20   [provider]  ASPIRIN LOW DOSE 81 MG EC tablet Take 81 mg by mouth in the morning. 02/22/21   [provider]  atorvastatin (LIPITOR) 40 MG tablet Take 40 mg by mouth in the morning. 02/09/21   [provider]  calcium acetate (PHOSLO) 667 MG capsule Take 2 capsules (1,334 mg total) by mouth with breakfast, with lunch, and with evening meal. 04/10/21   Debbe Odea, MD  carvedilol (COREG) 12.5 MG tablet Take 12.5 mg by mouth 2 (two) times daily with a meal. 04/17/20   [provider]  Cholecalciferol (VITAMIN D) 50 MCG (2000 UT) CAPS Take 2,000 Units by mouth daily.    [provider]  clopidogrel (PLAVIX) 75 MG tablet Take 1 tablet (75 mg total) by mouth daily. 11/02/22   Rhyne, Hulen Shouts, PA-C  cyclobenzaprine (FLEXERIL) 10 MG tablet Take 10 mg by mouth 3 (three) times daily as needed for muscle spasms. 12/29/21   [provider]  diazepam (VALIUM) 5 MG tablet Take 5 mg by mouth 2 (two) times daily as needed for muscle spasms. 12/09/21   [provider]  diphenhydrAMINE (BENADRYL) 25 MG tablet Take 50 mg by mouth every 6 (six) hours as needed for itching.    [provider]  insulin regular human CONCENTRATED (HUMULIN R U-500 KWIKPEN) 500 UNIT/ML KwikPen Inject 45 Units into the skin daily with supper. Patient taking differently: Inject 140 Units into the skin 2 (two) times daily with a meal. 09/04/21 08/30/24  [name], MD  metolazone (ZAROXOLYN) 10 MG tablet Take 10 mg by mouth daily. 07/13/21   [provider]  nortriptyline (PAMELOR) 10 MG capsule Take 20 mg by mouth at bedtime. 10/23/19 08/30/24  [provider]  oxyCODONE (ROXICODONE) 5 MG immediate release tablet Take 1 tablet (5 mg total) by mouth every 8 (eight) hours as needed. 12/31/22 12/31/23  [place] [name], DO  oxymetazoline (AFRIN) 0.05 % nasal spray Place 1 spray into both nostrils daily as needed (nose bleeds).    [provider]  pantoprazole (PROTONIX) 40 MG tablet Take 40 mg by mouth daily before breakfast. 02/09/21   [provider]  pregabalin (LYRICA) 75 MG capsule Take 1 capsule (75 mg total) by mouth See admin instructions. Daily.  Give after dialysis on dialysis days. 01/21/23   [name], MD  sertraline (ZOLOFT) 100 MG tablet  Take 100 mg by mouth in the morning. 02/22/21   [provider]  sodium chloride (OCEAN) 0.65 % SOLN nasal spray Place 2 sprays into both nostrils in the morning and at bedtime.    [provider]  tadalafil (CIALIS) 20 MG tablet Take 20 mg by mouth daily as needed for erectile dysfunction.    [provider]  tamsulosin (FLOMAX) 0.4 MG CAPS capsule Take 1 capsule (0.4 mg total) by mouth daily. 11/10/21   Darliss Cheney, MD  torsemide (DEMADEX) 20 MG tablet Take 120 mg by mouth 2 (two) times daily.    [provider]   traZODone (DESYREL) 150 MG tablet Take 150 mg by mouth at bedtime. 12/09/21   [provider]      Allergies    Actos [pioglitazone], Dexmedetomidine, Ibuprofen, Tomato, and Wellbutrin [bupropion]    Review of Systems   Review of Systems  Physical Exam Updated Vital Signs BP 127/73   Pulse 93   Temp 98.2 F (36.8 C) (Oral)   Resp 12   Ht '5\' 5"'$  (1.651 m)   Wt 131 kg   SpO2 100%   BMI 48.06 kg/m  Physical Exam Vitals and nursing note reviewed.  Constitutional:      General: He is not in acute distress.    Appearance: Normal appearance. He is obese.  HENT:     Head: Normocephalic and atraumatic.     Nose: Nose normal.     Mouth/Throat:     Mouth: Mucous membranes are moist.     Pharynx: Oropharynx is clear.  Eyes:     Extraocular Movements: Extraocular movements intact.     Conjunctiva/sclera: Conjunctivae normal.  Cardiovascular:     Rate and Rhythm: Normal rate and regular rhythm.     Heart sounds: Normal heart sounds.  Pulmonary:     Effort: Pulmonary effort is normal.     Breath sounds: Normal breath sounds.  Abdominal:     General: Abdomen is flat.  Musculoskeletal:        General: Normal range of motion.     Cervical back: Normal range of motion and neck supple.     Right lower leg: Edema (trace pitting edema to shin) present.     Comments: LLE AKA  Neurological:     General: No focal deficit present.     Mental Status: He is alert and oriented to person, place, and time.  Psychiatric:        Mood and Affect: Mood normal.        Behavior: Behavior normal.     ED Results / Procedures / Treatments   Labs (all labs ordered are listed, but only abnormal results are displayed) Labs Reviewed  CBC WITH DIFFERENTIAL/PLATELET - Abnormal; Notable for the following components:      Result Value   RBC 3.14 (*)    Hemoglobin 9.0 (*)    HCT 28.2 (*)    Platelets 115 (*)    Abs Immature Granulocytes 0.13 (*)    All other components within normal  limits  BASIC METABOLIC PANEL - Abnormal; Notable for the following components:   Sodium 134 (*)    Chloride 94 (*)    Glucose, Bld 227 (*)    BUN 63 (*)    Creatinine, Ser 5.47 (*)    Calcium 8.3 (*)    GFR, Estimated 11 (*)    Anion gap 16 (*)    All other components within normal limits  HEPATITIS B SURFACE  ANTIGEN  HEPATITIS B SURFACE ANTIBODY, QUANTITATIVE  CBC    EKG None  Radiology No results found.  Procedures Procedures    Medications Ordered in ED Medications  Chlorhexidine Gluconate Cloth 2 % PADS 6 each (has no administration in time range)  pentafluoroprop-tetrafluoroeth (GEBAUERS) aerosol 1 Application (has no administration in time range)  lidocaine (PF) (XYLOCAINE) 1 % injection 5 mL (has no administration in time range)  lidocaine-prilocaine (EMLA) cream 1 Application (has no administration in time range)  heparin injection 1,000 Units (has no administration in time range)  anticoagulant sodium citrate solution 5 mL (has no administration in time range)  alteplase (CATHFLO ACTIVASE) injection 2 mg (has no administration in time range)  Darbepoetin Alfa (ARANESP) injection 100 mcg (has no administration in time range)    ED Course/ Medical Decision Making/ A&P Clinical Course as of 01/26/23 1607  Thu Jan 26, 2023  1606 Care transitioned to provider default for reassessment after HD. [VK]    Clinical Course User Index [VK] Kemper Durie, DO                             Medical Decision Making This patient presents to the ED with chief complaint(s) of needing HD with pertinent past medical history of ESRD, COPD, HTN, DM which further complicates the presenting complaint. The complaint involves an extensive differential diagnosis and also carries with it a high risk of complications and morbidity.    The differential diagnosis includes No evidence of significant volume overload, considering electrolyte abnormality though no missed HD sessions    Additional history obtained: Additional history obtained from N/A Records reviewed prior ED records  ED Course and Reassessment: On patient's arrival to the emergency department he is awake alert in no acute complaints.  He has no missed HD sessions.  He does not appear significantly volume overloaded.  Nephrology will be consulted for regular HD today.  Independent labs interpretation:  The following labs were independently interpreted: mildly elevated AGAP, mild hyperglycemia, labs otherwise at patient's baseline  Independent visualization of imaging: N/A  Consultation: - Consulted or discussed management/test interpretation w/ external professional: Nephrology     Amount and/or Complexity of Data Reviewed Labs: ordered.          Final Clinical Impression(s) / ED Diagnoses Final diagnoses:  ESRD (end stage renal disease) on dialysis [facility])    Rx / DC Orders ED Discharge Orders     None         Kemper Durie, DO 01/26/23 1607

## 2023-01-26 NOTE — ED Triage Notes (Signed)
Pt. Here for dialysis. Denies any problems.

## 2023-01-27 LAB — PARATHYROID HORMONE, INTACT (NO CA): PTH: 253 pg/mL — ABNORMAL HIGH (ref 15–65)

## 2023-01-28 ENCOUNTER — Other Ambulatory Visit: Payer: Self-pay

## 2023-01-28 ENCOUNTER — Encounter (HOSPITAL_COMMUNITY): Payer: Self-pay

## 2023-01-28 ENCOUNTER — Emergency Department (HOSPITAL_COMMUNITY)
Admission: EM | Admit: 2023-01-28 | Discharge: 2023-01-28 | Disposition: A | Discharge status: Home / Self Care | Payer: 59 | Attending: Emergency Medicine | Admitting: Emergency Medicine

## 2023-01-28 DIAGNOSIS — Z992 Dependence on renal dialysis: Secondary | ICD-10-CM | POA: Diagnosis not present

## 2023-01-28 DIAGNOSIS — N186 End stage renal disease: Secondary | ICD-10-CM | POA: Diagnosis not present

## 2023-01-28 DIAGNOSIS — Z7982 Long term (current) use of aspirin: Secondary | ICD-10-CM | POA: Diagnosis not present

## 2023-01-28 DIAGNOSIS — N049 Nephrotic syndrome with unspecified morphologic changes: Secondary | ICD-10-CM

## 2023-01-28 DIAGNOSIS — Z7902 Long term (current) use of antithrombotics/antiplatelets: Secondary | ICD-10-CM | POA: Diagnosis not present

## 2023-01-28 DIAGNOSIS — R600 Localized edema: Secondary | ICD-10-CM | POA: Insufficient documentation

## 2023-01-28 LAB — HEPATITIS B SURFACE ANTIBODY, QUANTITATIVE: Hep B S AB Quant (Post): 25.1 m[IU]/mL (ref >9.9)

## 2023-01-28 MED ORDER — SODIUM CHLORIDE 0.9 % IV SOLN
250.0000 mg | Freq: Once | INTRAVENOUS | Status: DC
  Filled 2023-01-28: qty 20

## 2023-01-28 MED ORDER — CHLORHEXIDINE GLUCONATE CLOTH 2 % EX PADS: 6.0000 | MEDICATED_PAD | Freq: Every day | CUTANEOUS | Status: DC

## 2023-01-28 NOTE — Progress Notes (Signed)
Talk to ED RN and patient left before during Dialysis, ED RN Samantha Crimes, states, pt, states he come back Tuesday March 5th, 2024. MD Carolin Sicks is aware of pt leaving ED.

## 2023-01-28 NOTE — ED Provider Notes (Addendum)
[facility] Provider Note   CSN: [id number] Arrival date & time: 01/28/23  [id number]     History  Chief Complaint  Patient presents with   Vascular Access Problem    Needs Dialysis    Jeffrey [name] is a 62 y.o. male.  HPI Patient with a history of multiple medical issues including end-stage renal disease presents today with need for dialysis. Last dialysis was 2 days ago.  Patient receives dialysis at this facility after discharge from local HD center. He notes that since his last session he has no new complaints, denies new pain.    Home Medications Prior to Admission medications   Medication Sig Start Date End Date Taking? Authorizing Provider  amLODipine (NORVASC) 10 MG tablet Take 10 mg by mouth every morning. 04/17/20   [provider]  ASPIRIN LOW DOSE 81 MG EC tablet Take 81 mg by mouth in the morning. 02/22/21   [provider]  atorvastatin (LIPITOR) 40 MG tablet Take 40 mg by mouth in the morning. 02/09/21   [provider]  calcium acetate (PHOSLO) 667 MG capsule Take 2 capsules (1,334 mg total) by mouth with breakfast, with lunch, and with evening meal. 04/10/21   Debbe Odea, MD  carvedilol (COREG) 12.5 MG tablet Take 12.5 mg by mouth 2 (two) times daily with a meal. 04/17/20   [provider]  Cholecalciferol (VITAMIN D) 50 MCG (2000 UT) CAPS Take 2,000 Units by mouth daily.    [provider]  clopidogrel (PLAVIX) 75 MG tablet Take 1 tablet (75 mg total) by mouth daily. 11/02/22   Rhyne, Hulen Shouts, PA-C  cyclobenzaprine (FLEXERIL) 10 MG tablet Take 10 mg by mouth 3 (three) times daily as needed for muscle spasms. 12/29/21   [provider]  diazepam (VALIUM) 5 MG tablet Take 5 mg by mouth 2 (two) times daily as needed for muscle spasms. 12/09/21   [provider]  diphenhydrAMINE (BENADRYL) 25 MG tablet Take 50 mg by mouth every 6 (six) hours as needed for itching.     [provider]  insulin regular human CONCENTRATED (HUMULIN R U-500 KWIKPEN) 500 UNIT/ML KwikPen Inject 45 Units into the skin daily with supper. Patient taking differently: Inject 140 Units into the skin 2 (two) times daily with a meal. 09/04/21 08/30/24  [name], MD  metolazone (ZAROXOLYN) 10 MG tablet Take 10 mg by mouth daily. 07/13/21   [provider]  nortriptyline (PAMELOR) 10 MG capsule Take 20 mg by mouth at bedtime. 10/23/19 08/30/24  [provider]  oxyCODONE (ROXICODONE) 5 MG immediate release tablet Take 1 tablet (5 mg total) by mouth every 8 (eight) hours as needed. 12/31/22 12/31/23  [place] [name], DO  oxymetazoline (AFRIN) 0.05 % nasal spray Place 1 spray into both nostrils daily as needed (nose bleeds).    [provider]  pantoprazole (PROTONIX) 40 MG tablet Take 40 mg by mouth daily before breakfast. 02/09/21   [provider]  pregabalin (LYRICA) 75 MG capsule Take 1 capsule (75 mg total) by mouth See admin instructions. Daily.  Give after dialysis on dialysis days. 01/21/23   [name], MD  sertraline (ZOLOFT) 100 MG tablet Take 100 mg by mouth in the morning. 02/22/21   [provider]  sodium chloride (OCEAN) 0.65 % SOLN nasal spray Place 2 sprays into both nostrils in the morning and at bedtime.    [provider]  tadalafil (CIALIS) 20 MG  tablet Take 20 mg by mouth daily as needed for erectile dysfunction.    [provider]  tamsulosin (FLOMAX) 0.4 MG CAPS capsule Take 1 capsule (0.4 mg total) by mouth daily. 11/10/21   Darliss Cheney, MD  torsemide (DEMADEX) 20 MG tablet Take 120 mg by mouth 2 (two) times daily.    [provider]  traZODone (DESYREL) 150 MG tablet Take 150 mg by mouth at bedtime. 12/09/21   [provider]      Allergies    Actos [pioglitazone], Dexmedetomidine, Ibuprofen, Tomato, and Wellbutrin [bupropion]    Review of Systems   Review of Systems  All  other systems reviewed and are negative.   Physical Exam Updated Vital Signs BP (!) 147/58 (BP Location: Left Arm)   Pulse 92   Temp 98.3 F (36.8 C) (Oral)   Resp 18   SpO2 99%  Physical Exam Vitals and nursing note reviewed.  Constitutional:      General: He is not in acute distress.    Appearance: Normal appearance. He is obese.  HENT:     Head: Normocephalic and atraumatic.     Nose: Nose normal.     Mouth/Throat:     Mouth: Mucous membranes are moist.     Pharynx: Oropharynx is clear.  Eyes:     Extraocular Movements: Extraocular movements intact.     Conjunctiva/sclera: Conjunctivae normal.  Pulmonary:     Effort: Pulmonary effort is normal.  Abdominal:     General: Abdomen is flat.  Musculoskeletal:     Cervical back: Normal range of motion and neck supple.     Right lower leg: Edema (trace pitting edema to shin) present.     Comments: LLE AKA  Neurological:     General: No focal deficit present.     Mental Status: He is alert and oriented to person, place, and time.  Psychiatric:        Mood and Affect: Mood normal.        Behavior: Behavior normal.     ED Results / Procedures / Treatments   Labs (all labs ordered are listed, but only abnormal results are displayed) Labs Reviewed - No data to display  EKG None  Radiology No results found.  Procedures Procedures    Medications Ordered in ED Medications  Chlorhexidine Gluconate Cloth 2 % PADS 6 each (has no administration in time range)  ferric gluconate (FERRLECIT) 250 mg in sodium chloride 0.9 % 250 mL IVPB (has no administration in time range)    ED Course/ Medical Decision Making/ A&P                             Medical Decision Making Adult male with multiple medical issues including end-stage renal disease on dialysis presents with dialysis.  Patient is awake, alert afebrile.  Hemodynamically unremarkable, but differential includes fluid overload status, infection, other concerns.   Patient monitored, I discussed this case with her nephrologist, and dialysis was facilitated.  Amount and/or Complexity of Data Reviewed External Data Reviewed: notes.    Details: Have seen and evaluated patient multiple times in reviewing prior notes and his note from 2 days ago in the emergency department with dialysis. Discussion of management or test interpretation with external provider(s): Discussed this case with her nephrologist to facilitate dialysis  Risk Prescription drug management. Decision regarding hospitalization.  Final Clinical Impression(s) / ED Diagnoses Final diagnoses:  Anasarca associated with disorder of  kidney   2:06 PM In spite of multiple efforts to facilitate dialysis, and nursing staff calling multiple times the patient had not yet gone to dialysis.  He states that he has to leave to catch a ride. Rx / DC Orders ED Discharge Orders     None         Carmin Muskrat, MD 01/28/23 [id number]    Carmin Muskrat, MD 01/28/23 1406

## 2023-01-28 NOTE — ED Notes (Addendum)
The patient needed another oxygen tank. This EMT provided the patient with another tank. The patient asked about how much longer it would be to get to dialysis stating that his transportation is tight on Saturdays. This EMT informed the RN.

## 2023-01-28 NOTE — Discharge Instructions (Signed)
Although you have elected to leave without completing a dialysis session, do not hesitate to return here for concerning changes in your condition.

## 2023-01-28 NOTE — ED Notes (Signed)
RN called dialysis at this time but per dialysis, pt is next on the list to get dialysis. Pt wanted to leave at this time. Social work called by [employer] twice with no answer. Case management contacted by this RN with no solutions to help pt with transportation home. EDP notified at this time who agreed to discharge pt home. Pt states he will come back on Tuesday to get dialysis and will control his fluid intake at home. Pt was discharged at 1400.

## 2023-01-28 NOTE — ED Notes (Signed)
Lunch tray ordered 

## 2023-01-28 NOTE — ED Notes (Signed)
Pt alerted RN if dialysis is going to take him soon as he is getting worried about his transportation. RN called dialysis again at this time and was told that they are doing their best but will take pt soon. Pt agreed to stay but states he has to leave if they don't get him around 1300 as his transportation only runs until 1700. Will continue to monitor.

## 2023-01-28 NOTE — ED Triage Notes (Signed)
Patient is here for dialysis, no new concerns, denies any pain, and has not missed any dialysis sessions.

## 2023-01-28 NOTE — ED Notes (Signed)
RN called hemodialysis at this time. No ETA for pt dialysis today. Pt told RN that he rides with [facility] that provides him transportation to and from the hospital to his house. 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 Pt states his transport only runs until 5PM. RN relayed message to dialysis. Per HD, pt is one of their regular pts and they are aware of his transportation situation. Will continue to monitor.

## 2023-01-28 NOTE — Progress Notes (Signed)
Brief Progress Note  PEIRRE CORONA [id number] December 05, 1960  Aware of patient in the ED for routine dialysis.  On TTS schedule but discharged from outpatient HD unit due to behavioral issues.  Last HD completed at [facility] on 01/26/23.    Orders placed for HD today.  Hep B surface Antigen negative on 01/26/23. Hgb dropping with tsat 22% - will order IV iron '250mg'$  x1 today. Aranesp 173mg given on 2/29, can continue qwk.  Last PTH in goal for ESRD patient.   Last OP HD orders:  3:15h  129kg  RUE AVG   Heparin 2000   Will be seen once on HD if able. Please contact with any questions/concerns in the interim.   LJen Mow PA-C [employer]

## 2023-01-31 ENCOUNTER — Other Ambulatory Visit: Payer: Self-pay

## 2023-01-31 ENCOUNTER — Emergency Department (HOSPITAL_COMMUNITY)
Admission: EM | Admit: 2023-01-31 | Discharge: 2023-01-31 | Disposition: A | Discharge status: Home / Self Care | Payer: 59 | Attending: Emergency Medicine | Admitting: Emergency Medicine

## 2023-01-31 ENCOUNTER — Encounter (HOSPITAL_COMMUNITY): Payer: Self-pay

## 2023-01-31 DIAGNOSIS — Z7982 Long term (current) use of aspirin: Secondary | ICD-10-CM | POA: Diagnosis not present

## 2023-01-31 DIAGNOSIS — M25512 Pain in left shoulder: Secondary | ICD-10-CM | POA: Insufficient documentation

## 2023-01-31 DIAGNOSIS — Z992 Dependence on renal dialysis: Secondary | ICD-10-CM | POA: Diagnosis not present

## 2023-01-31 DIAGNOSIS — Z794 Long term (current) use of insulin: Secondary | ICD-10-CM | POA: Insufficient documentation

## 2023-01-31 DIAGNOSIS — W19XXXA Unspecified fall, initial encounter: Secondary | ICD-10-CM | POA: Insufficient documentation

## 2023-01-31 DIAGNOSIS — N186 End stage renal disease: Secondary | ICD-10-CM

## 2023-01-31 DIAGNOSIS — Z79899 Other long term (current) drug therapy: Secondary | ICD-10-CM | POA: Insufficient documentation

## 2023-01-31 DIAGNOSIS — Z7902 Long term (current) use of antithrombotics/antiplatelets: Secondary | ICD-10-CM | POA: Diagnosis not present

## 2023-01-31 LAB — RENAL FUNCTION PANEL
Albumin: 3.4 g/dL — ABNORMAL LOW (ref 3.5–5.0)
Anion gap: 13 (ref 5–15)
BUN: 108 mg/dL — ABNORMAL HIGH (ref 8–23)
CO2: 29 mmol/L (ref 22–32)
Calcium: 8.2 mg/dL — ABNORMAL LOW (ref 8.9–10.3)
Chloride: 94 mmol/L — ABNORMAL LOW (ref 98–111)
Creatinine, Ser: 5.84 mg/dL — ABNORMAL HIGH (ref 0.61–1.24)
GFR, Estimated: 10 mL/min — ABNORMAL LOW (ref >60)
Glucose, Bld: 188 mg/dL — ABNORMAL HIGH (ref 70–99)
Phosphorus: 4.7 mg/dL — ABNORMAL HIGH (ref 2.5–4.6)
Potassium: 3.4 mmol/L — ABNORMAL LOW (ref 3.5–5.1)
Sodium: 136 mmol/L (ref 135–145)

## 2023-01-31 LAB — CBC
HCT: 26.2 % — ABNORMAL LOW (ref 39.0–52.0)
Hemoglobin: 8.7 g/dL — ABNORMAL LOW (ref 13.0–17.0)
MCH: 29.3 pg (ref 26.0–34.0)
MCHC: 33.2 g/dL (ref 30.0–36.0)
MCV: 88.2 fL (ref 80.0–100.0)
Platelets: 120 10*3/uL — ABNORMAL LOW (ref 150–400)
RBC: 2.97 MIL/uL — ABNORMAL LOW (ref 4.22–5.81)
RDW: 14.3 % (ref 11.5–15.5)
WBC: 7.6 10*3/uL (ref 4.0–10.5)
nRBC: 0 % (ref 0.0–0.2)

## 2023-01-31 MED ORDER — HEPARIN SODIUM (PORCINE) 1000 UNIT/ML DIALYSIS
2000.0000 [IU] | Freq: Once | INTRAMUSCULAR | Status: AC
  Administered 2023-01-31: 2000 [IU] via INTRAVENOUS_CENTRAL
  Filled 2023-01-31: qty 2

## 2023-01-31 MED ORDER — CHLORHEXIDINE GLUCONATE CLOTH 2 % EX PADS: 6.0000 | MEDICATED_PAD | Freq: Every day | CUTANEOUS | Status: DC

## 2023-01-31 NOTE — Procedures (Signed)
We are asked to do dialysis on this patient who was discharged from his OP HD unit in December 2023 due to behavioral issues. Pt is here in ED requesting hospital HD. Plan is for HD upstairs then return to ED for reassessment and expected discharge home.      Last OP HD orders from dec 2023:  3:15h  129kg  RUE AVG   Heparin 2000    I was present at this dialysis session, have reviewed the session itself and made  appropriate changes Kelly Splinter MD [place] pager [phone]   01/31/2023, 2:02 PM     Last Labs       Recent Labs  Lab 01/24/23 0937 01/24/23 1035 01/26/23 0736  HGB  --  9.0* 9.0*  ALBUMIN 3.5  --   --   CALCIUM 8.4*  --  8.3*  PHOS 4.8*  --   --   CREATININE 5.69*  --  5.47*  K 3.9  --  3.8        Inpatient medications:  Chlorhexidine Gluconate Cloth  6 each Topical Q0600

## 2023-01-31 NOTE — ED Notes (Signed)
Patient leaving department prior to discharge paperwork or vitals.

## 2023-01-31 NOTE — ED Provider Notes (Signed)
[facility] Provider Note   CSN: [id number] Arrival date & time: 01/31/23  0725     History {Add pertinent medical, surgical, social history, OB history to HPI:1} No chief complaint on file.   Jeffrey [name] is a 62 y.o. male.  HPI 62 year old male presents with need for dialysis.  Patient has been dialyzing by coming to the ED.  He reports last dialysis last Thursday.  He states that he was sent here on Saturday but it took too long.  He is having some mild dyspnea but does not feel like he is any distress.   Home Medications Prior to Admission medications   Medication Sig Start Date End Date Taking? Authorizing Provider  amLODipine (NORVASC) 10 MG tablet Take 10 mg by mouth every morning. 04/17/20   [provider]  ASPIRIN LOW DOSE 81 MG EC tablet Take 81 mg by mouth in the morning. 02/22/21   [provider]  atorvastatin (LIPITOR) 40 MG tablet Take 40 mg by mouth in the morning. 02/09/21   [provider]  calcium acetate (PHOSLO) 667 MG capsule Take 2 capsules (1,334 mg total) by mouth with breakfast, with lunch, and with evening meal. 04/10/21   Debbe Odea, MD  carvedilol (COREG) 12.5 MG tablet Take 12.5 mg by mouth 2 (two) times daily with a meal. 04/17/20   [provider]  Cholecalciferol (VITAMIN D) 50 MCG (2000 UT) CAPS Take 2,000 Units by mouth daily.    [provider]  clopidogrel (PLAVIX) 75 MG tablet Take 1 tablet (75 mg total) by mouth daily. 11/02/22   Rhyne, Hulen Shouts, PA-C  cyclobenzaprine (FLEXERIL) 10 MG tablet Take 10 mg by mouth 3 (three) times daily as needed for muscle spasms. 12/29/21   [provider]  diazepam (VALIUM) 5 MG tablet Take 5 mg by mouth 2 (two) times daily as needed for muscle spasms. 12/09/21   [provider]  diphenhydrAMINE (BENADRYL) 25 MG tablet Take 50 mg by mouth every 6 (six) hours as needed for itching.    [provider]   insulin regular human CONCENTRATED (HUMULIN R U-500 KWIKPEN) 500 UNIT/ML KwikPen Inject 45 Units into the skin daily with supper. Patient taking differently: Inject 140 Units into the skin 2 (two) times daily with a meal. 09/04/21 08/30/24  [name], MD  metolazone (ZAROXOLYN) 10 MG tablet Take 10 mg by mouth daily. 07/13/21   [provider]  nortriptyline (PAMELOR) 10 MG capsule Take 20 mg by mouth at bedtime. 10/23/19 08/30/24  [provider]  oxyCODONE (ROXICODONE) 5 MG immediate release tablet Take 1 tablet (5 mg total) by mouth every 8 (eight) hours as needed. 12/31/22 12/31/23  [place] [name], DO  oxymetazoline (AFRIN) 0.05 % nasal spray Place 1 spray into both nostrils daily as needed (nose bleeds).    [provider]  pantoprazole (PROTONIX) 40 MG tablet Take 40 mg by mouth daily before breakfast. 02/09/21   [provider]  pregabalin (LYRICA) 75 MG capsule Take 1 capsule (75 mg total) by mouth See admin instructions. Daily.  Give after dialysis on dialysis days. 01/21/23   [name], MD  sertraline (ZOLOFT) 100 MG tablet Take 100 mg by mouth in the morning. 02/22/21   [provider]  sodium chloride (OCEAN) 0.65 % SOLN nasal spray Place 2 sprays into both nostrils in the morning and at bedtime.    [provider]  tadalafil (CIALIS) 20 MG tablet  Take 20 mg by mouth daily as needed for erectile dysfunction.    [provider]  tamsulosin (FLOMAX) 0.4 MG CAPS capsule Take 1 capsule (0.4 mg total) by mouth daily. 11/10/21   Darliss Cheney, MD  torsemide (DEMADEX) 20 MG tablet Take 120 mg by mouth 2 (two) times daily.    [provider]  traZODone (DESYREL) 150 MG tablet Take 150 mg by mouth at bedtime. 12/09/21   [provider]      Allergies    Actos [pioglitazone], Dexmedetomidine, Ibuprofen, Tomato, and Wellbutrin [bupropion]    Review of Systems   Review of Systems  Physical Exam Updated Vital  Signs BP (!) 157/66 (BP Location: Left Arm)   Pulse 88   Temp 98.5 F (36.9 C) (Oral)   Resp 18   SpO2 99%  Physical Exam Vitals reviewed.  Constitutional:      Appearance: Normal appearance. He is obese.  HENT:     Head: Normocephalic.     Nose: Nose normal.     Mouth/Throat:     Mouth: Mucous membranes are dry.  Eyes:     Pupils: Pupils are equal, round, and reactive to light.  Cardiovascular:     Rate and Rhythm: Normal rate and regular rhythm.     Comments: Dialysis access left thigh with good thrill Pulmonary:     Effort: Pulmonary effort is normal.  Abdominal:     General: Abdomen is flat.  Musculoskeletal:        General: Normal range of motion.     Cervical back: Normal range of motion.     Comments: Left lower extremity amputation Dialysis access graft right upper extremity  Skin:    General: Skin is warm.     Capillary Refill: Capillary refill takes less than 2 seconds.  Neurological:     General: No focal deficit present.     Mental Status: He is alert.  Psychiatric:        Mood and Affect: Mood normal.     ED Results / Procedures / Treatments   Labs (all labs ordered are listed, but only abnormal results are displayed) Labs Reviewed - No data to display  EKG None  Radiology No results found.  Procedures Procedures  {Document cardiac monitor, telemetry assessment procedure when appropriate:1}  Medications Ordered in ED Medications - No data to display  ED Course/ Medical Decision Making/ A&P   {   Click here for ABCD2, HEART and other calculatorsREFRESH Note before signing :1}                          Medical Decision Making Amount and/or Complexity of Data Reviewed Radiology: ordered.     {Document critical care time when appropriate:1} {Document review of labs and clinical decision tools ie heart score, Chads2Vasc2 etc:1}  {Document your independent review of radiology images, and any outside records:1} {Document your discussion  with family members, caretakers, and with consultants:1} {Document social determinants of health affecting pt's care:1} {Document your decision making why or why not admission, treatments were needed:1} Final Clinical Impression(s) / ED Diagnoses Final diagnoses:  None    Rx / DC Orders ED Discharge Orders     None

## 2023-01-31 NOTE — ED Triage Notes (Signed)
Pt here for dialysis, was here on Saturday but never made it up to dialysis, pt last session was on Thursday. Pt c.o DOE.

## 2023-01-31 NOTE — ED Provider Notes (Signed)
[name]   CSN: [id number] Arrival date & time: 01/31/23  0725     History  No chief complaint on file.   Jeffrey [name] is a 62 y.o. male.  HPI 62 year old male presents with need for dialysis.  Patient has been dialyzing by coming to the ED.  He reports last dialysis last Thursday.  He is not having dyspnea and does not feel like he is volume overloaded.  He does report that he fell this morning and some pain in his left shoulder.    Home Medications Prior to Admission medications   Medication Sig Start Date End Date Taking? Authorizing Provider  amLODipine (NORVASC) 10 MG tablet Take 10 mg by mouth every morning. 04/17/20   [provider]  ASPIRIN LOW DOSE 81 MG EC tablet Take 81 mg by mouth in the morning. 02/22/21   [provider]  atorvastatin (LIPITOR) 40 MG tablet Take 40 mg by mouth in the morning. 02/09/21   [provider]  calcium acetate (PHOSLO) 667 MG capsule Take 2 capsules (1,334 mg total) by mouth with breakfast, with lunch, and with evening meal. 04/10/21   Debbe Odea, MD  carvedilol (COREG) 12.5 MG tablet Take 12.5 mg by mouth 2 (two) times daily with a meal. 04/17/20   [provider]  Cholecalciferol (VITAMIN D) 50 MCG (2000 UT) CAPS Take 2,000 Units by mouth daily.    [provider]  clopidogrel (PLAVIX) 75 MG tablet Take 1 tablet (75 mg total) by mouth daily. 11/02/22   Rhyne, Hulen Shouts, PA-C  cyclobenzaprine (FLEXERIL) 10 MG tablet Take 10 mg by mouth 3 (three) times daily as needed for muscle spasms. 12/29/21   [provider]  diazepam (VALIUM) 5 MG tablet Take 5 mg by mouth 2 (two) times daily as needed for muscle spasms. 12/09/21   [provider]  diphenhydrAMINE (BENADRYL) 25 MG tablet Take 50 mg by mouth every 6 (six) hours as needed for itching.    [provider]  insulin regular human CONCENTRATED (HUMULIN R U-500 KWIKPEN)  500 UNIT/ML KwikPen Inject 45 Units into the skin daily with supper. Patient taking differently: Inject 140 Units into the skin 2 (two) times daily with a meal. 09/04/21 08/30/24  [name], MD  metolazone (ZAROXOLYN) 10 MG tablet Take 10 mg by mouth daily. 07/13/21   [provider]  nortriptyline (PAMELOR) 10 MG capsule Take 20 mg by mouth at bedtime. 10/23/19 08/30/24  [provider]  oxyCODONE (ROXICODONE) 5 MG immediate release tablet Take 1 tablet (5 mg total) by mouth every 8 (eight) hours as needed. 12/31/22 12/31/23  [place] [name], DO  oxymetazoline (AFRIN) 0.05 % nasal spray Place 1 spray into both nostrils daily as needed (nose bleeds).    [provider]  pantoprazole (PROTONIX) 40 MG tablet Take 40 mg by mouth daily before breakfast. 02/09/21   [provider]  pregabalin (LYRICA) 75 MG capsule Take 1 capsule (75 mg total) by mouth See admin instructions. Daily.  Give after dialysis on dialysis days. 01/21/23   [name], MD  sertraline (ZOLOFT) 100 MG tablet Take 100 mg by mouth in the morning. 02/22/21   [provider]  sodium chloride (OCEAN) 0.65 % SOLN nasal spray Place 2 sprays into both nostrils in the morning and at bedtime.    [provider]  tadalafil (CIALIS) 20 MG tablet Take 20 mg by mouth daily as needed  for erectile dysfunction.    [provider]  tamsulosin (FLOMAX) 0.4 MG CAPS capsule Take 1 capsule (0.4 mg total) by mouth daily. 11/10/21   Darliss Cheney, MD  torsemide (DEMADEX) 20 MG tablet Take 120 mg by mouth 2 (two) times daily.    [provider]  traZODone (DESYREL) 150 MG tablet Take 150 mg by mouth at bedtime. 12/09/21   [provider]      Allergies    Actos [pioglitazone], Dexmedetomidine, Ibuprofen, Tomato, and Wellbutrin [bupropion]    Review of Systems   Review of Systems  Physical Exam Updated Vital Signs BP (!) 157/66 (BP Location: Left Arm)   Pulse 88   Temp  98.5 F (36.9 C) (Oral)   Resp 18   SpO2 99%  Physical Exam Vitals reviewed.  Constitutional:      Appearance: Normal appearance.  HENT:     Head: Normocephalic.     Nose: Nose normal.     Mouth/Throat:     Mouth: Mucous membranes are dry.  Eyes:     Pupils: Pupils are equal, round, and reactive to light.  Cardiovascular:     Rate and Rhythm: Normal rate and regular rhythm.     Comments: Dialysis access left thigh with good thrill Pulmonary:     Effort: Pulmonary effort is normal.  Abdominal:     General: Abdomen is flat.  Musculoskeletal:        General: Normal range of motion.     Cervical back: Normal range of motion.     Comments: Left shoulder with no obvious signs of trauma flexion range of motion mild diffuse tenderness to palpation  Skin:    General: Skin is warm.     Capillary Refill: Capillary refill takes less than 2 seconds.  Neurological:     General: No focal deficit present.     Mental Status: He is alert.  Psychiatric:        Mood and Affect: Mood normal.     ED Results / Procedures / Treatments   Labs (all labs ordered are listed, but only abnormal results are displayed) Labs Reviewed - No data to display  EKG None  Radiology No results found.  Procedures Procedures    Medications Ordered in ED Medications - No data to display  ED Course/ Medical Decision Making/ A&P                             Medical Decision Making Amount and/or Complexity of Data Reviewed Radiology: ordered.   Patient who needs nephrology Patient with some volume overload but no acute compensation Discussed with Dr. Burnett Sheng who will dialyze        Final Clinical Impression(s) / ED Diagnoses Final diagnoses:  ESRD needing dialysis [facility])    Rx / DC Orders ED Discharge Orders     None         Pattricia Boss, MD 01/31/23 [phone]

## 2023-01-31 NOTE — Procedures (Signed)
HD Note:  Some information was entered later than the data was gathered due to patient care needs. The stated time with the data is accurate.  Received patient in bed to unit.  Alert and oriented.  Informed consent signed and in chart.   TX duration:3.25 hours  Patient tolerated well.  Transported back to the ED for discharge Alert, without acute distress.  Hand-off given to patient's nurse.   Access used: Right upper arm graft Access issues: none  Total UF removed: 2500 ml  [facility]

## 2023-02-02 ENCOUNTER — Emergency Department (HOSPITAL_COMMUNITY)
Admission: EM | Admit: 2023-02-02 | Discharge: 2023-02-02 | Disposition: A | Discharge status: Home / Self Care | Payer: 59 | Attending: Emergency Medicine | Admitting: Emergency Medicine

## 2023-02-02 DIAGNOSIS — Z794 Long term (current) use of insulin: Secondary | ICD-10-CM | POA: Insufficient documentation

## 2023-02-02 DIAGNOSIS — Z79899 Other long term (current) drug therapy: Secondary | ICD-10-CM | POA: Diagnosis not present

## 2023-02-02 DIAGNOSIS — N186 End stage renal disease: Secondary | ICD-10-CM | POA: Insufficient documentation

## 2023-02-02 DIAGNOSIS — R600 Localized edema: Secondary | ICD-10-CM | POA: Insufficient documentation

## 2023-02-02 DIAGNOSIS — Z7902 Long term (current) use of antithrombotics/antiplatelets: Secondary | ICD-10-CM | POA: Diagnosis not present

## 2023-02-02 DIAGNOSIS — Z992 Dependence on renal dialysis: Secondary | ICD-10-CM | POA: Insufficient documentation

## 2023-02-02 DIAGNOSIS — Z7982 Long term (current) use of aspirin: Secondary | ICD-10-CM | POA: Insufficient documentation

## 2023-02-02 DIAGNOSIS — R0602 Shortness of breath: Secondary | ICD-10-CM | POA: Diagnosis present

## 2023-02-02 LAB — CBG MONITORING, ED: Glucose-Capillary: 373 mg/dL — ABNORMAL HIGH (ref 70–99)

## 2023-02-02 MED ORDER — HEPARIN SODIUM (PORCINE) 1000 UNIT/ML DIALYSIS
2000.0000 [IU] | Freq: Once | INTRAMUSCULAR | Status: AC
  Administered 2023-02-02: 2000 [IU] via INTRAVENOUS_CENTRAL
  Filled 2023-02-02: qty 2

## 2023-02-02 MED ORDER — CHLORHEXIDINE GLUCONATE CLOTH 2 % EX PADS: 6.0000 | MEDICATED_PAD | Freq: Every day | CUTANEOUS | Status: DC

## 2023-02-02 NOTE — ED Provider Notes (Signed)
Patient back from dialysis.  No symptoms.  Discharge.   Lennice Sites, DO 02/02/23 1656

## 2023-02-02 NOTE — Procedures (Signed)
We are asked to see this patient for hospital dialysis. Pt was discharged from his OP HD unit in December 2023 due to behavioral issues. Pt is here in ED. Plan is for HD upstairs then return to ED for reassessment and expected discharge home.      Last OP HD orders from dec 2023:  3:15h  129kg  RUE AVG   Heparin 2000      I was present at this dialysis session, have reviewed the session itself and made  appropriate changes Kelly Splinter MD [place] pager [phone]   02/02/2023, 2:06 PM     Last Labs     Recent Labs  Lab 01/31/23 1004  HGB 8.7*  ALBUMIN 3.4*  CALCIUM 8.2*  PHOS 4.7*  CREATININE 5.84*  K 3.4*

## 2023-02-02 NOTE — ED Provider Notes (Signed)
[facility] Provider Note   CSN: [id number] Arrival date & time: 02/02/23  [id number]     History  Chief Complaint  Patient presents with   Vascular Access Problem    Jeffrey [name] is a 62 y.o. male.  HPI Patient presents for dialysis.  Last dialyzed 2 days ago.  No longer can get done as an outpatient due to behavioral issues.  States he feels as if he needs dialysis.  He is on chronic oxygen at 2 L.  States more short of breath.  More fatigue.  No chest pain.    Home Medications Prior to Admission medications   Medication Sig Start Date End Date Taking? Authorizing Provider  amLODipine (NORVASC) 10 MG tablet Take 10 mg by mouth every morning. 04/17/20   [provider]  ASPIRIN LOW DOSE 81 MG EC tablet Take 81 mg by mouth in the morning. 02/22/21   [provider]  atorvastatin (LIPITOR) 40 MG tablet Take 40 mg by mouth in the morning. 02/09/21   [provider]  calcium acetate (PHOSLO) 667 MG capsule Take 2 capsules (1,334 mg total) by mouth with breakfast, with lunch, and with evening meal. 04/10/21   Debbe Odea, MD  carvedilol (COREG) 12.5 MG tablet Take 12.5 mg by mouth 2 (two) times daily with a meal. 04/17/20   [provider]  Cholecalciferol (VITAMIN D) 50 MCG (2000 UT) CAPS Take 2,000 Units by mouth daily.    [provider]  clopidogrel (PLAVIX) 75 MG tablet Take 1 tablet (75 mg total) by mouth daily. 11/02/22   Rhyne, Hulen Shouts, PA-C  cyclobenzaprine (FLEXERIL) 10 MG tablet Take 10 mg by mouth 3 (three) times daily as needed for muscle spasms. 12/29/21   [provider]  diazepam (VALIUM) 5 MG tablet Take 5 mg by mouth 2 (two) times daily as needed for muscle spasms. 12/09/21   [provider]  diphenhydrAMINE (BENADRYL) 25 MG tablet Take 50 mg by mouth every 6 (six) hours as needed for itching.    [provider]  insulin regular human CONCENTRATED (HUMULIN R U-500  KWIKPEN) 500 UNIT/ML KwikPen Inject 45 Units into the skin daily with supper. Patient taking differently: Inject 140 Units into the skin 2 (two) times daily with a meal. 09/04/21 08/30/24  [name], MD  metolazone (ZAROXOLYN) 10 MG tablet Take 10 mg by mouth daily. 07/13/21   [provider]  nortriptyline (PAMELOR) 10 MG capsule Take 20 mg by mouth at bedtime. 10/23/19 08/30/24  [provider]  oxyCODONE (ROXICODONE) 5 MG immediate release tablet Take 1 tablet (5 mg total) by mouth every 8 (eight) hours as needed. 12/31/22 12/31/23  [place] [name], DO  oxymetazoline (AFRIN) 0.05 % nasal spray Place 1 spray into both nostrils daily as needed (nose bleeds).    [provider]  pantoprazole (PROTONIX) 40 MG tablet Take 40 mg by mouth daily before breakfast. 02/09/21   [provider]  pregabalin (LYRICA) 75 MG capsule Take 1 capsule (75 mg total) by mouth See admin instructions. Daily.  Give after dialysis on dialysis days. 01/21/23   [name], MD  sertraline (ZOLOFT) 100 MG tablet Take 100 mg by mouth in the morning. 02/22/21   [provider]  sodium chloride (OCEAN) 0.65 % SOLN nasal spray Place 2 sprays into both nostrils in the morning and at bedtime.    [provider]  tadalafil (CIALIS) 20 MG tablet Take 20  mg by mouth daily as needed for erectile dysfunction.    [provider]  tamsulosin (FLOMAX) 0.4 MG CAPS capsule Take 1 capsule (0.4 mg total) by mouth daily. 11/10/21   Darliss Cheney, MD  torsemide (DEMADEX) 20 MG tablet Take 120 mg by mouth 2 (two) times daily.    [provider]  traZODone (DESYREL) 150 MG tablet Take 150 mg by mouth at bedtime. 12/09/21   [provider]      Allergies    Actos [pioglitazone], Dexmedetomidine, Ibuprofen, Tomato, and Wellbutrin [bupropion]    Review of Systems   Review of Systems  Physical Exam Updated Vital Signs BP (!) 118/47 (BP Location: Left Arm)   Pulse 95    Temp 98.1 F (36.7 C) (Oral)   Resp 20   Ht '5\' 5"'$  (1.651 m)   Wt 131 kg   SpO2 98%   BMI 48.06 kg/m  Physical Exam Vitals and nursing note reviewed.  Cardiovascular:     Rate and Rhythm: Regular rhythm.  Pulmonary:     Comments: Mildly harsh breath sounds.  On nasal cannula oxygen. Musculoskeletal:     Comments: Chronic edema right lower extremity.  Previous left above-the-knee amputation.  Neurological:     Mental Status: He is alert and oriented to person, place, and time.     ED Results / Procedures / Treatments   Labs (all labs ordered are listed, but only abnormal results are displayed) Labs Reviewed  CBG MONITORING, ED - Abnormal; Notable for the following components:      Result Value   Glucose-Capillary 373 (*)    All other components within normal limits    EKG None  Radiology No results found.  Procedures Procedures    Medications Ordered in ED Medications  Chlorhexidine Gluconate Cloth 2 % PADS 6 each (has no administration in time range)  heparin injection 2,000 Units (2,000 Units Dialysis Given 02/02/23 1300)    ED Course/ Medical Decision Making/ A&P                             Medical Decision Making  Patient with end-stage renal disease on dialysis.  Dialyzed through ER since due to behavioral issues cannot go to his outpatient center.  Discussed with Dr. Jonnie Finner who will dialyze.  Patient has been taken to dialysis.         Final Clinical Impression(s) / ED Diagnoses Final diagnoses:  ESRD (end stage renal disease) on dialysis [facility])    Rx / DC Orders ED Discharge Orders     None         Davonna Belling, MD 02/02/23 1531

## 2023-02-02 NOTE — ED Triage Notes (Signed)
Pt. Here for dialysis , Took off 21/2 on Tuesday. Able to complete time in dialysis.

## 2023-02-04 ENCOUNTER — Emergency Department (HOSPITAL_COMMUNITY)
Admission: EM | Admit: 2023-02-04 | Discharge: 2023-02-04 | Disposition: A | Discharge status: Home / Self Care | Payer: 59 | Attending: Emergency Medicine | Admitting: Emergency Medicine

## 2023-02-04 ENCOUNTER — Encounter (HOSPITAL_COMMUNITY): Payer: Self-pay

## 2023-02-04 DIAGNOSIS — Z7982 Long term (current) use of aspirin: Secondary | ICD-10-CM | POA: Insufficient documentation

## 2023-02-04 DIAGNOSIS — Z7902 Long term (current) use of antithrombotics/antiplatelets: Secondary | ICD-10-CM | POA: Diagnosis not present

## 2023-02-04 DIAGNOSIS — Z992 Dependence on renal dialysis: Secondary | ICD-10-CM | POA: Insufficient documentation

## 2023-02-04 DIAGNOSIS — N186 End stage renal disease: Secondary | ICD-10-CM | POA: Insufficient documentation

## 2023-02-04 MED ORDER — CHLORHEXIDINE GLUCONATE CLOTH 2 % EX PADS: 6.0000 | MEDICATED_PAD | Freq: Every day | CUTANEOUS | Status: DC

## 2023-02-04 MED ORDER — HEPARIN SODIUM (PORCINE) 1000 UNIT/ML DIALYSIS
2000.0000 [IU] | Freq: Once | INTRAMUSCULAR | Status: AC
  Administered 2023-02-04: 2000 [IU] via INTRAVENOUS_CENTRAL
  Filled 2023-02-04 (×2): qty 2

## 2023-02-04 NOTE — ED Triage Notes (Signed)
Pt here for dialysis; last received full treatment Thursday; pt has no complaints

## 2023-02-04 NOTE — ED Notes (Signed)
Pt presents w/ no complaints and only needs routine dialysis.  NAD noted.  Pt eating [employer].

## 2023-02-04 NOTE — ED Provider Notes (Signed)
[facility] Provider Note   CSN: [id number] Arrival date & time: 02/04/23  0800     History  No chief complaint on file.   Jeffrey [name] is a 62 y.o. male.  62 year old male with prior medical history as detailed below presents for evaluation.  Patient without current outpatient dialysis access.  Patient is coming to the ED 3 times a week for his dialysis.  He arrives today for his dialysis.  He is otherwise without complaint.  His last dialysis session was on Thursday through the ED here at [facility].  He reports no significant problems with that session.    The history is provided by the patient and medical records.       Home Medications Prior to Admission medications   Medication Sig Start Date End Date Taking? Authorizing Provider  amLODipine (NORVASC) 10 MG tablet Take 10 mg by mouth every morning. 04/17/20   [provider]  ASPIRIN LOW DOSE 81 MG EC tablet Take 81 mg by mouth in the morning. 02/22/21   [provider]  atorvastatin (LIPITOR) 40 MG tablet Take 40 mg by mouth in the morning. 02/09/21   [provider]  calcium acetate (PHOSLO) 667 MG capsule Take 2 capsules (1,334 mg total) by mouth with breakfast, with lunch, and with evening meal. 04/10/21   Debbe Odea, MD  carvedilol (COREG) 12.5 MG tablet Take 12.5 mg by mouth 2 (two) times daily with a meal. 04/17/20   [provider]  Cholecalciferol (VITAMIN D) 50 MCG (2000 UT) CAPS Take 2,000 Units by mouth daily.    [provider]  clopidogrel (PLAVIX) 75 MG tablet Take 1 tablet (75 mg total) by mouth daily. 11/02/22   Rhyne, Hulen Shouts, PA-C  cyclobenzaprine (FLEXERIL) 10 MG tablet Take 10 mg by mouth 3 (three) times daily as needed for muscle spasms. 12/29/21   [provider]  diazepam (VALIUM) 5 MG tablet Take 5 mg by mouth 2 (two) times daily as needed for muscle spasms. 12/09/21   [provider]   diphenhydrAMINE (BENADRYL) 25 MG tablet Take 50 mg by mouth every 6 (six) hours as needed for itching.    [provider]  insulin regular human CONCENTRATED (HUMULIN R U-500 KWIKPEN) 500 UNIT/ML KwikPen Inject 45 Units into the skin daily with supper. Patient taking differently: Inject 140 Units into the skin 2 (two) times daily with a meal. 09/04/21 08/30/24  [name], MD  metolazone (ZAROXOLYN) 10 MG tablet Take 10 mg by mouth daily. 07/13/21   [provider]  nortriptyline (PAMELOR) 10 MG capsule Take 20 mg by mouth at bedtime. 10/23/19 08/30/24  [provider]  oxyCODONE (ROXICODONE) 5 MG immediate release tablet Take 1 tablet (5 mg total) by mouth every 8 (eight) hours as needed. 12/31/22 12/31/23  [place] [name], DO  oxymetazoline (AFRIN) 0.05 % nasal spray Place 1 spray into both nostrils daily as needed (nose bleeds).    [provider]  pantoprazole (PROTONIX) 40 MG tablet Take 40 mg by mouth daily before breakfast. 02/09/21   [provider]  pregabalin (LYRICA) 75 MG capsule Take 1 capsule (75 mg total) by mouth See admin instructions. Daily.  Give after dialysis on dialysis days. 01/21/23   [name], MD  sertraline (ZOLOFT) 100 MG tablet Take 100 mg by mouth in the morning. 02/22/21   [provider]  sodium chloride (OCEAN) 0.65 % SOLN nasal spray Place 2  sprays into both nostrils in the morning and at bedtime.    [provider]  tadalafil (CIALIS) 20 MG tablet Take 20 mg by mouth daily as needed for erectile dysfunction.    [provider]  tamsulosin (FLOMAX) 0.4 MG CAPS capsule Take 1 capsule (0.4 mg total) by mouth daily. 11/10/21   Darliss Cheney, MD  torsemide (DEMADEX) 20 MG tablet Take 120 mg by mouth 2 (two) times daily.    [provider]  traZODone (DESYREL) 150 MG tablet Take 150 mg by mouth at bedtime. 12/09/21   [provider]      Allergies    Actos [pioglitazone],  Dexmedetomidine, Ibuprofen, Tomato, and Wellbutrin [bupropion]    Review of Systems   Review of Systems  All other systems reviewed and are negative.   Physical Exam Updated Vital Signs BP (!) 141/47 (BP Location: Left Arm)   Pulse 88   Temp 98.1 F (36.7 C)   Resp 16   SpO2 98%  Physical Exam Vitals and nursing note reviewed.  Constitutional:      General: He is not in acute distress.    Appearance: Normal appearance. He is well-developed.  HENT:     Head: Normocephalic and atraumatic.  Eyes:     Conjunctiva/sclera: Conjunctivae normal.     Pupils: Pupils are equal, round, and reactive to light.  Cardiovascular:     Rate and Rhythm: Normal rate and regular rhythm.     Heart sounds: Normal heart sounds.  Pulmonary:     Effort: Pulmonary effort is normal. No respiratory distress.     Breath sounds: Normal breath sounds.  Abdominal:     General: There is no distension.     Palpations: Abdomen is soft.     Tenderness: There is no abdominal tenderness.  Musculoskeletal:        General: No deformity. Normal range of motion.     Cervical back: Normal range of motion and neck supple.  Skin:    General: Skin is warm and dry.  Neurological:     General: No focal deficit present.     Mental Status: He is alert and oriented to person, place, and time.     ED Results / Procedures / Treatments   Labs (all labs ordered are listed, but only abnormal results are displayed) Labs Reviewed - No data to display  EKG None  Radiology No results found.  Procedures Procedures    Medications Ordered in ED Medications  Chlorhexidine Gluconate Cloth 2 % PADS 6 each (has no administration in time range)    ED Course/ Medical Decision Making/ A&P                             Medical Decision Making   Medical Screen Complete  This patient presented to the ED with complaint of needs dialysis.  This complaint involves an extensive number of treatment options. The initial  differential diagnosis includes, but is not limited to, ESRD, on HD  This presentation is: Chronic, Self-Limited, Previously Undiagnosed, Uncertain Prognosis, Complicated, Systemic Symptoms, and Threat to Life/Bodily Function  Patient is presenting for routine dialysis session.  Patient without current access to outpatient dialysis center.  Patient is currently being dialyzed through the ED on a 3 times a week basis.  His last dialysis session was on Thursday.  He is presenting today for dialysis.  Dr. Jonnie Finner with nephrology is aware of case.  Patient to  be dialyzed today and will likely be appropriate for discharge after dialysis completion.    Additional history obtained:  External records from outside sources obtained and reviewed including prior ED visits and prior Inpatient records.   Problem List / ED Course:  ESRD on HD   Reevaluation:  After the interventions noted above, I reevaluated the patient and found that they have: stayed the same  Disposition:  After consideration of the diagnostic results and the patients response to treatment, I feel that the patent would benefit from completion of dialysis.           Final Clinical Impression(s) / ED Diagnoses Final diagnoses:  ESRD (end stage renal disease) ([place])    Rx / DC Orders ED Discharge Orders     None         Valarie Merino, MD 02/04/23 [phone]

## 2023-02-04 NOTE — ED Notes (Signed)
Consent at bedside.  

## 2023-02-04 NOTE — Discharge Instructions (Signed)
Go to dialysis as planned. 

## 2023-02-04 NOTE — Procedures (Signed)
HD Note:  Some information was entered later than the data was gathered due to patient care needs. The stated time with the data is accurate.  Received patient on stretcher from ED  Alert and oriented.  Informed consent signed and in chart.   TX duration: 3 hours  Patient tolerated well.  Transported back to the ED Alert, without acute distress.  Hand-off given to patient's nurse.   Access used: Right upper arm fistula Access issues: None  Total UF removed: 3000 ml   [facility]

## 2023-02-04 NOTE — ED Notes (Signed)
Pt made aware that transport is on the way and the dialysis center would like him on the stretch.  Pt to use restroom and then get onto stretcher.

## 2023-02-04 NOTE — ED Notes (Signed)
ED Provider at bedside. 

## 2023-02-04 NOTE — Procedures (Signed)
We are asked to see this patient for hospital dialysis. Pt was discharged from his OP HD unit in December 2023 due to behavioral issues. Pt is here in ED. Plan is for HD upstairs then return to ED for reassessment and expected discharge home.    Last hep B labs here were on 01/26/23     Last OP HD orders from dec 2023:  3:15h  129kg  RUE AVG   Heparin 2000      I was present at this dialysis session, have reviewed the session itself and made  appropriate changes Kelly Splinter MD [place] pager [phone]   02/04/2023, 3:17 PM

## 2023-02-04 NOTE — ED Notes (Signed)
Pt refused temperature.

## 2023-02-04 NOTE — ED Provider Notes (Signed)
Pt returns from dialysis and states he is ready to go home.  He is breathing comfortably and has no complaints.   Blanchie Dessert, MD 02/04/23 [phone]

## 2023-02-07 ENCOUNTER — Emergency Department (HOSPITAL_COMMUNITY)
Admission: EM | Admit: 2023-02-07 | Discharge: 2023-02-07 | Disposition: A | Discharge status: Home / Self Care | Payer: 59 | Attending: Emergency Medicine | Admitting: Emergency Medicine

## 2023-02-07 ENCOUNTER — Other Ambulatory Visit: Payer: Self-pay

## 2023-02-07 DIAGNOSIS — Z7982 Long term (current) use of aspirin: Secondary | ICD-10-CM | POA: Diagnosis not present

## 2023-02-07 DIAGNOSIS — Z79899 Other long term (current) drug therapy: Secondary | ICD-10-CM | POA: Insufficient documentation

## 2023-02-07 DIAGNOSIS — I120 Hypertensive chronic kidney disease with stage 5 chronic kidney disease or end stage renal disease: Secondary | ICD-10-CM | POA: Diagnosis not present

## 2023-02-07 DIAGNOSIS — I509 Heart failure, unspecified: Secondary | ICD-10-CM | POA: Insufficient documentation

## 2023-02-07 DIAGNOSIS — J449 Chronic obstructive pulmonary disease, unspecified: Secondary | ICD-10-CM | POA: Diagnosis not present

## 2023-02-07 DIAGNOSIS — Z992 Dependence on renal dialysis: Secondary | ICD-10-CM | POA: Insufficient documentation

## 2023-02-07 DIAGNOSIS — N186 End stage renal disease: Secondary | ICD-10-CM | POA: Diagnosis not present

## 2023-02-07 DIAGNOSIS — Z7902 Long term (current) use of antithrombotics/antiplatelets: Secondary | ICD-10-CM | POA: Insufficient documentation

## 2023-02-07 DIAGNOSIS — E1122 Type 2 diabetes mellitus with diabetic chronic kidney disease: Secondary | ICD-10-CM | POA: Diagnosis not present

## 2023-02-07 LAB — BASIC METABOLIC PANEL
Anion gap: 19 — ABNORMAL HIGH (ref 5–15)
BUN: 88 mg/dL — ABNORMAL HIGH (ref 8–23)
CO2: 25 mmol/L (ref 22–32)
Calcium: 8.2 mg/dL — ABNORMAL LOW (ref 8.9–10.3)
Chloride: 90 mmol/L — ABNORMAL LOW (ref 98–111)
Creatinine, Ser: 5.33 mg/dL — ABNORMAL HIGH (ref 0.61–1.24)
GFR, Estimated: 11 mL/min — ABNORMAL LOW (ref >60)
Glucose, Bld: 317 mg/dL — ABNORMAL HIGH (ref 70–99)
Potassium: 3.3 mmol/L — ABNORMAL LOW (ref 3.5–5.1)
Sodium: 134 mmol/L — ABNORMAL LOW (ref 135–145)

## 2023-02-07 LAB — CBC
HCT: 26.6 % — ABNORMAL LOW (ref 39.0–52.0)
Hemoglobin: 8.5 g/dL — ABNORMAL LOW (ref 13.0–17.0)
MCH: 28.6 pg (ref 26.0–34.0)
MCHC: 32 g/dL (ref 30.0–36.0)
MCV: 89.6 fL (ref 80.0–100.0)
Platelets: 130 10*3/uL — ABNORMAL LOW (ref 150–400)
RBC: 2.97 MIL/uL — ABNORMAL LOW (ref 4.22–5.81)
RDW: 14.6 % (ref 11.5–15.5)
WBC: 8.1 10*3/uL (ref 4.0–10.5)
nRBC: 0 % (ref 0.0–0.2)

## 2023-02-07 MED ORDER — LIDOCAINE HCL (PF) 1 % IJ SOLN: 5.0000 mL | INTRAMUSCULAR | Status: DC | PRN

## 2023-02-07 MED ORDER — ANTICOAGULANT SODIUM CITRATE 4% (200MG/5ML) IV SOLN: 5.0000 mL | Status: DC | PRN

## 2023-02-07 MED ORDER — ALTEPLASE 2 MG IJ SOLR: 2.0000 mg | Freq: Once | INTRAMUSCULAR | Status: DC | PRN

## 2023-02-07 MED ORDER — HEPARIN BOLUS VIA INFUSION
2000.0000 [IU] | Freq: Once | INTRAVENOUS | Status: AC
  Administered 2023-02-07: 2000 [IU] via INTRAVENOUS_CENTRAL
  Filled 2023-02-07: qty 2000

## 2023-02-07 MED ORDER — HEPARIN SODIUM (PORCINE) 1000 UNIT/ML DIALYSIS: 1000.0000 [IU] | INTRAMUSCULAR | Status: DC | PRN

## 2023-02-07 MED ORDER — PENTAFLUOROPROP-TETRAFLUOROETH EX AERO: 1.0000 | INHALATION_SPRAY | CUTANEOUS | Status: DC | PRN

## 2023-02-07 MED ORDER — CHLORHEXIDINE GLUCONATE CLOTH 2 % EX PADS: 6.0000 | MEDICATED_PAD | Freq: Every day | CUTANEOUS | Status: DC

## 2023-02-07 MED ORDER — LIDOCAINE-PRILOCAINE 2.5-2.5 % EX CREA: 1.0000 | TOPICAL_CREAM | CUTANEOUS | Status: DC | PRN

## 2023-02-07 NOTE — ED Provider Notes (Signed)
[facility] Provider Note   CSN: [id number] Arrival date & time: 02/07/23  [id number]     History {Add pertinent medical, surgical, social history, OB history to HPI:1} Chief Complaint  Patient presents with   Vascular Access Problem    Jeffrey [name] is a 62 y.o. male.  HPI Patient presents for dialysis.  Gets dialyzed through the ER because cannot get dialyzed at outpatient center due to behaviors.  Chronic oxygen.  States he was last dialyzed Saturday and is on schedule.  No chest pain or new trouble breathing.   Past Medical History:  Diagnosis Date   Altered mental status 05/04/2021   Arthritis    Blood transfusion without reported diagnosis    CHF (congestive heart failure) (HCC)    COPD (chronic obstructive pulmonary disease) (H[facility])    Diabetes mellitus without complication ([place])    ESRD on hemodialysis ([place])    Hypertension    PTSD (post-traumatic stress disorder)    Sepsis ([place])     Home Medications Prior to Admission medications   Medication Sig Start Date End Date Taking? Authorizing Provider  amLODipine (NORVASC) 10 MG tablet Take 10 mg by mouth every morning. 04/17/20   [provider]  ASPIRIN LOW DOSE 81 MG EC tablet Take 81 mg by mouth in the morning. 02/22/21   [provider]  atorvastatin (LIPITOR) 40 MG tablet Take 40 mg by mouth in the morning. 02/09/21   [provider]  calcium acetate (PHOSLO) 667 MG capsule Take 2 capsules (1,334 mg total) by mouth with breakfast, with lunch, and with evening meal. 04/10/21   Debbe Odea, MD  carvedilol (COREG) 12.5 MG tablet Take 12.5 mg by mouth 2 (two) times daily with a meal. 04/17/20   [provider]  Cholecalciferol (VITAMIN D) 50 MCG (2000 UT) CAPS Take 2,000 Units by mouth daily.    [provider]  clopidogrel (PLAVIX) 75 MG tablet Take 1 tablet (75 mg total) by mouth daily. 11/02/22   Rhyne, Hulen Shouts, PA-C  cyclobenzaprine  (FLEXERIL) 10 MG tablet Take 10 mg by mouth 3 (three) times daily as needed for muscle spasms. 12/29/21   [provider]  diazepam (VALIUM) 5 MG tablet Take 5 mg by mouth 2 (two) times daily as needed for muscle spasms. 12/09/21   [provider]  diphenhydrAMINE (BENADRYL) 25 MG tablet Take 50 mg by mouth every 6 (six) hours as needed for itching.    [provider]  insulin regular human CONCENTRATED (HUMULIN R U-500 KWIKPEN) 500 UNIT/ML KwikPen Inject 45 Units into the skin daily with supper. Patient taking differently: Inject 140 Units into the skin 2 (two) times daily with a meal. 09/04/21 08/30/24  [name], MD  metolazone (ZAROXOLYN) 10 MG tablet Take 10 mg by mouth daily. 07/13/21   [provider]  nortriptyline (PAMELOR) 10 MG capsule Take 20 mg by mouth at bedtime. 10/23/19 08/30/24  [provider]  oxyCODONE (ROXICODONE) 5 MG immediate release tablet Take 1 tablet (5 mg total) by mouth every 8 (eight) hours as needed. 12/31/22 12/31/23  [place] [name], DO  oxymetazoline (AFRIN) 0.05 % nasal spray Place 1 spray into both nostrils daily as needed (nose bleeds).    [provider]  pantoprazole (PROTONIX) 40 MG tablet Take 40 mg by mouth daily before breakfast. 02/09/21   [provider]  pregabalin (LYRICA) 75 MG capsule Take 1 capsule (75 mg total) by mouth See  admin instructions. Daily.  Give after dialysis on dialysis days. 01/21/23   [name], MD  sertraline (ZOLOFT) 100 MG tablet Take 100 mg by mouth in the morning. 02/22/21   [provider]  sodium chloride (OCEAN) 0.65 % SOLN nasal spray Place 2 sprays into both nostrils in the morning and at bedtime.    [provider]  tadalafil (CIALIS) 20 MG tablet Take 20 mg by mouth daily as needed for erectile dysfunction.    [provider]  tamsulosin (FLOMAX) 0.4 MG CAPS capsule Take 1 capsule (0.4 mg total) by mouth daily. 11/10/21   Darliss Cheney,  MD  torsemide (DEMADEX) 20 MG tablet Take 120 mg by mouth 2 (two) times daily.    [provider]  traZODone (DESYREL) 150 MG tablet Take 150 mg by mouth at bedtime. 12/09/21   [provider]      Allergies    Actos [pioglitazone], Dexmedetomidine, Ibuprofen, Tomato, and Wellbutrin [bupropion]    Review of Systems   Review of Systems  Physical Exam Updated Vital Signs BP (!) 129/38   Pulse 86   Temp 98.6 F (37 C) (Oral)   Resp 18   SpO2 98%  Physical Exam Vitals and nursing note reviewed.  HENT:     Nose:     Comments: On nasal cannula oxygen. Cardiovascular:     Rate and Rhythm: Regular rhythm.  Pulmonary:     Breath sounds: No rales.  Musculoskeletal:     Comments: Previous left above-the-knee amputation.  Neurological:     Mental Status: He is alert.     ED Results / Procedures / Treatments   Labs (all labs ordered are listed, but only abnormal results are displayed) Labs Reviewed - No data to display  EKG None  Radiology No results found.  Procedures Procedures  {Document cardiac monitor, telemetry assessment procedure when appropriate:1}  Medications Ordered in ED Medications - No data to display  ED Course/ Medical Decision Making/ A&P   {   Click here for ABCD2, HEART and other calculatorsREFRESH Note before signing :1}                          Medical Decision Making  Patient presents for dialysis.  Well-appearing.  Will discuss with nephrology.  {Document critical care time when appropriate:1} {Document review of labs and clinical decision tools ie heart score, Chads2Vasc2 etc:1}  {Document your independent review of radiology images, and any outside records:1} {Document your discussion with family members, caretakers, and with consultants:1} {Document social determinants of health affecting pt's care:1} {Document your decision making why or why not admission, treatments were needed:1} Final Clinical Impression(s) / ED  Diagnoses Final diagnoses:  None    Rx / DC Orders ED Discharge Orders     None

## 2023-02-07 NOTE — ED Triage Notes (Signed)
Pt here for dialysis 

## 2023-02-07 NOTE — Progress Notes (Signed)
Received patient in bed to unit.  Alert and oriented.  Informed consent signed and in chart.   TX duration: 3.75hrs  Patient tolerated well.  Alert, without acute distress.  Hand-off given to patient's nurse.   Access used: R AVG Access issues: None  Total UF removed: 2572m Medication(s) given: None  Post HD weight: unable to obtain - weighing scale on ED stretcher is not working.    02/07/23 1446  Vitals  Temp 98.2 F (36.8 C)  Temp Source Oral  BP (!) 133/56  MAP (mmHg) 77  BP Location Left Arm  BP Method Automatic  Patient Position (if appropriate) Lying  Pulse Rate 88  Pulse Rate Source Monitor  ECG Heart Rate 89  Resp 18  Oxygen Therapy  SpO2 94 %  O2 Device Nasal Cannula  O2 Flow Rate (L/min) 2 L/min  Patient Activity (if Appropriate) In bed  Pulse Oximetry Type Continuous  During Treatment Monitoring  Intra-Hemodialysis Comments Tolerated well  Post Treatment  Dialyzer Clearance Lightly streaked  Duration of HD Treatment -hour(s) 3.75 hour(s)  Liters Processed 90  Fluid Removed (mL) 2500 mL  Tolerated HD Treatment Yes  Post-Hemodialysis Comments 6  AVG/AVF Arterial Site Held (minutes) 6 minutes  Fistula / Graft Right Upper arm Arteriovenous vein graft  Placement Date: 10/31/22   Placed prior to admission: Yes  Orientation: Right  Access Location: Upper arm  Access Type: Arteriovenous vein graft  Site Condition No complications  Fistula / Graft Assessment Present;Thrill;Bruit  Status Deaccessed    [facility]

## 2023-02-07 NOTE — ED Notes (Signed)
RN went to pt bedside to assess pt and obtain updated vitals post return from dialysis. Pt was not at bedside. RN asked near by staff if they had seen or spoken with patient. Non-clinical staff reported that pt stated "I'm tired of waiting, Im heading out and will go to my scheduled dialysis Thursday." MD Pearline Cables made aware.

## 2023-02-07 NOTE — ED Provider Notes (Signed)
Pt returned from HD, told nursing he was ready to go and eloped from the ED     Jeanell Sparrow, DO 02/07/23 1555

## 2023-02-09 ENCOUNTER — Other Ambulatory Visit: Payer: Self-pay

## 2023-02-09 ENCOUNTER — Emergency Department (HOSPITAL_COMMUNITY)
Admission: EM | Admit: 2023-02-09 | Discharge: 2023-02-10 | Disposition: A | Discharge status: Home / Self Care | Payer: 59 | Attending: Emergency Medicine | Admitting: Emergency Medicine

## 2023-02-09 ENCOUNTER — Encounter (HOSPITAL_COMMUNITY): Payer: Self-pay

## 2023-02-09 DIAGNOSIS — Z992 Dependence on renal dialysis: Secondary | ICD-10-CM | POA: Diagnosis not present

## 2023-02-09 DIAGNOSIS — S40021A Contusion of right upper arm, initial encounter: Secondary | ICD-10-CM | POA: Insufficient documentation

## 2023-02-09 DIAGNOSIS — W228XXA Striking against or struck by other objects, initial encounter: Secondary | ICD-10-CM | POA: Diagnosis not present

## 2023-02-09 DIAGNOSIS — N186 End stage renal disease: Secondary | ICD-10-CM | POA: Insufficient documentation

## 2023-02-09 DIAGNOSIS — M79601 Pain in right arm: Secondary | ICD-10-CM | POA: Diagnosis present

## 2023-02-09 DIAGNOSIS — Z7982 Long term (current) use of aspirin: Secondary | ICD-10-CM | POA: Insufficient documentation

## 2023-02-09 DIAGNOSIS — Z794 Long term (current) use of insulin: Secondary | ICD-10-CM | POA: Diagnosis not present

## 2023-02-09 LAB — CBC
HCT: 31 % — ABNORMAL LOW (ref 39.0–52.0)
Hemoglobin: 10.1 g/dL — ABNORMAL LOW (ref 13.0–17.0)
MCH: 29 pg (ref 26.0–34.0)
MCHC: 32.6 g/dL (ref 30.0–36.0)
MCV: 89.1 fL (ref 80.0–100.0)
Platelets: 147 10*3/uL — ABNORMAL LOW (ref 150–400)
RBC: 3.48 MIL/uL — ABNORMAL LOW (ref 4.22–5.81)
RDW: 14.6 % (ref 11.5–15.5)
WBC: 9.6 10*3/uL (ref 4.0–10.5)
nRBC: 0 % (ref 0.0–0.2)

## 2023-02-09 LAB — RENAL FUNCTION PANEL
Albumin: 3.7 g/dL (ref 3.5–5.0)
Anion gap: 15 (ref 5–15)
BUN: 76 mg/dL — ABNORMAL HIGH (ref 8–23)
CO2: 28 mmol/L (ref 22–32)
Calcium: 8.2 mg/dL — ABNORMAL LOW (ref 8.9–10.3)
Chloride: 94 mmol/L — ABNORMAL LOW (ref 98–111)
Creatinine, Ser: 5.02 mg/dL — ABNORMAL HIGH (ref 0.61–1.24)
GFR, Estimated: 12 mL/min — ABNORMAL LOW (ref >60)
Glucose, Bld: 237 mg/dL — ABNORMAL HIGH (ref 70–99)
Phosphorus: 4.8 mg/dL — ABNORMAL HIGH (ref 2.5–4.6)
Potassium: 3.4 mmol/L — ABNORMAL LOW (ref 3.5–5.1)
Sodium: 137 mmol/L (ref 135–145)

## 2023-02-09 LAB — HEPATITIS B SURFACE ANTIGEN: Hepatitis B Surface Ag: NONREACTIVE

## 2023-02-09 MED ORDER — CHLORHEXIDINE GLUCONATE CLOTH 2 % EX PADS: 6.0000 | MEDICATED_PAD | Freq: Every day | CUTANEOUS | Status: DC

## 2023-02-09 MED ORDER — LIDOCAINE HCL (PF) 1 % IJ SOLN: 5.0000 mL | INTRAMUSCULAR | Status: DC | PRN

## 2023-02-09 MED ORDER — LIDOCAINE-PRILOCAINE 2.5-2.5 % EX CREA: 1.0000 | TOPICAL_CREAM | CUTANEOUS | Status: DC | PRN

## 2023-02-09 MED ORDER — PENTAFLUOROPROP-TETRAFLUOROETH EX AERO: 1.0000 | INHALATION_SPRAY | CUTANEOUS | Status: DC | PRN

## 2023-02-09 MED ORDER — DARBEPOETIN ALFA 100 MCG/0.5ML IJ SOSY
100.0000 ug | PREFILLED_SYRINGE | INTRAMUSCULAR | Status: DC
  Filled 2023-02-09: qty 0.5

## 2023-02-09 MED ORDER — HEPARIN SODIUM (PORCINE) 1000 UNIT/ML IJ SOLN: 2500.0000 [IU] | Freq: Once | INTRAMUSCULAR | Status: AC

## 2023-02-09 MED ORDER — HEPARIN SODIUM (PORCINE) 1000 UNIT/ML IJ SOLN
INTRAMUSCULAR | Status: AC
  Administered 2023-02-09: 2500 [IU] via INTRAVENOUS
  Filled 2023-02-09: qty 3

## 2023-02-09 NOTE — Procedures (Signed)
HD Note:  Some information was entered later than the data was gathered due to patient care needs. The stated time with the data is accurate.  Received patient on a stretcher from to unit.  Alert and oriented.  Informed consent signed and in chart.   TX duration: 3.5  Patient tolerated treatment well.  He stated that he wanted off of treatment at 1615 and that he would not sign the AMA form because it wasn't his fault.  He stated that we didn't get him on the machine fast enough today and he has to meet his ride. He also was expressing sadness because his wife has been admitted to [place] back to the ED.  Alert, without acute distress.  Hand-off given to patient's nurse.   Access used: Right upper arm fistula Access issues: None  Total UF removed: [name]

## 2023-02-09 NOTE — ED Provider Notes (Signed)
[facility] Provider Note   CSN: [id number] Arrival date & time: 02/09/23  [id number]     History  Chief Complaint  Patient presents with   needs dialysis    KERBY FOLLOWELL is a 62 y.o. male.  62 yo M with a cc of needing dialysis.  The patient has been getting dialysis through the hospital system as he unfortunately has not been allowed to go to an outpatient dialysis center due to behavior.  He last was dialyzed 2 days ago.  Denies any significant change to his baseline with the exception that he has developed some pain and swelling to the right arm.  He thinks a vascular surgeon needs to look at it.  Has had no trouble having the fistula accessed.        Home Medications Prior to Admission medications   Medication Sig Start Date End Date Taking? Authorizing Provider  amLODipine (NORVASC) 10 MG tablet Take 10 mg by mouth every morning. 04/17/20   [provider]  ASPIRIN LOW DOSE 81 MG EC tablet Take 81 mg by mouth in the morning. 02/22/21   [provider]  atorvastatin (LIPITOR) 40 MG tablet Take 40 mg by mouth in the morning. 02/09/21   [provider]  calcium acetate (PHOSLO) 667 MG capsule Take 2 capsules (1,334 mg total) by mouth with breakfast, with lunch, and with evening meal. 04/10/21   Debbe Odea, MD  carvedilol (COREG) 12.5 MG tablet Take 12.5 mg by mouth 2 (two) times daily with a meal. 04/17/20   [provider]  Cholecalciferol (VITAMIN D) 50 MCG (2000 UT) CAPS Take 2,000 Units by mouth daily.    [provider]  clopidogrel (PLAVIX) 75 MG tablet Take 1 tablet (75 mg total) by mouth daily. 11/02/22   Rhyne, Hulen Shouts, PA-C  cyclobenzaprine (FLEXERIL) 10 MG tablet Take 10 mg by mouth 3 (three) times daily as needed for muscle spasms. 12/29/21   [provider]  diazepam (VALIUM) 5 MG tablet Take 5 mg by mouth 2 (two) times daily as needed for muscle spasms. 12/09/21   [provider]  diphenhydrAMINE (BENADRYL) 25 MG tablet Take 50 mg by mouth every 6 (six) hours as needed for itching.    [provider]  insulin regular human CONCENTRATED (HUMULIN R U-500 KWIKPEN) 500 UNIT/ML KwikPen Inject 45 Units into the skin daily with supper. Patient taking differently: Inject 140 Units into the skin 2 (two) times daily with a meal. 09/04/21 08/30/24  [name], MD  metolazone (ZAROXOLYN) 10 MG tablet Take 10 mg by mouth daily. 07/13/21   [provider]  nortriptyline (PAMELOR) 10 MG capsule Take 20 mg by mouth at bedtime. 10/23/19 08/30/24  [provider]  oxyCODONE (ROXICODONE) 5 MG immediate release tablet Take 1 tablet (5 mg total) by mouth every 8 (eight) hours as needed. 12/31/22 12/31/23  [place] [name], DO  oxymetazoline (AFRIN) 0.05 % nasal spray Place 1 spray into both nostrils daily as needed (nose bleeds).    [provider]  pantoprazole (PROTONIX) 40 MG tablet Take 40 mg by mouth daily before breakfast. 02/09/21   [provider]  pregabalin (LYRICA) 75 MG capsule Take 1 capsule (75 mg total) by mouth See admin instructions. Daily.  Give after dialysis on dialysis days. 01/21/23   [name], MD  sertraline (ZOLOFT) 100 MG tablet Take 100 mg by mouth in the morning. 02/22/21   [provider]  sodium chloride (OCEAN) 0.65 % SOLN nasal spray Place 2 sprays into both nostrils in the morning and at bedtime.    [provider]  tadalafil (CIALIS) 20 MG tablet Take 20 mg by mouth daily as needed for erectile dysfunction.    [provider]  tamsulosin (FLOMAX) 0.4 MG CAPS capsule Take 1 capsule (0.4 mg total) by mouth daily. 11/10/21   Darliss Cheney, MD  torsemide (DEMADEX) 20 MG tablet Take 120 mg by mouth 2 (two) times daily.    [provider]  traZODone (DESYREL) 150 MG tablet Take 150 mg by mouth at bedtime. 12/09/21   [provider]      Allergies    Actos  [pioglitazone], Dexmedetomidine, Ibuprofen, Tomato, and Wellbutrin [bupropion]    Review of Systems   Review of Systems  Physical Exam Updated Vital Signs BP (!) 154/74 (BP Location: Left Arm)   Pulse 86   Temp 98.5 F (36.9 C)   Resp 20   Ht '5\' 5"'$  (1.651 m)   Wt 131 kg   SpO2 98%   BMI 48.06 kg/m  Physical Exam Vitals and nursing note reviewed.  Constitutional:      Appearance: He is well-developed.  HENT:     Head: Normocephalic and atraumatic.  Eyes:     Pupils: Pupils are equal, round, and reactive to light.  Neck:     Vascular: No JVD.  Cardiovascular:     Rate and Rhythm: Normal rate and regular rhythm.     Heart sounds: No murmur heard.    No friction rub. No gallop.  Pulmonary:     Effort: No respiratory distress.     Breath sounds: No wheezing.  Abdominal:     General: There is no distension.     Tenderness: There is no abdominal tenderness. There is no guarding or rebound.  Musculoskeletal:        General: Normal range of motion.     Cervical back: Normal range of motion and neck supple.     Comments: Right AV fistula with palpable thrill.  Patient does have areas that appear to be hematomas surrounding.  Able to range without significant discomfort.  Bilateral AKA.  Skin:    Coloration: Skin is not pale.     Findings: No rash.  Neurological:     Mental Status: He is alert and oriented to person, place, and time.  Psychiatric:        Behavior: Behavior normal.     ED Results / Procedures / Treatments   Labs (all labs ordered are listed, but only abnormal results are displayed) Labs Reviewed  HEPATITIS B SURFACE ANTIGEN  HEPATITIS B SURFACE ANTIBODY, QUANTITATIVE    EKG None  Radiology No results found.  Procedures Procedures    Medications Ordered in ED Medications  Chlorhexidine Gluconate Cloth 2 % PADS 6 each (has no administration in time range)  Darbepoetin Alfa (ARANESP) injection 100 mcg (has no administration in time range)     ED Course/ Medical Decision Making/ A&P                             Medical Decision Making  62 yo M incisional disease on dialysis Tuesday Thursday and Saturday is here for his routine dialysis session.  His only added complaint is pain and swelling to the right arm which is where his AV fistula is.  Clinically patient likely has a hematoma.  Likely due to dialysis access.  Chronically on oxygen.  No increased shortness of breath with patient.  Will discuss with nephrology. Discussed with Dr. Joelyn Oms, will arrange for dialysis.  The patients results and plan were reviewed and discussed.   Any x-rays performed were independently reviewed by myself.   Differential diagnosis were considered with the presenting HPI.  Medications  Chlorhexidine Gluconate Cloth 2 % PADS 6 each (has no administration in time range)  Darbepoetin Alfa (ARANESP) injection 100 mcg (has no administration in time range)    Vitals:   02/09/23 0719 02/09/23 0724  BP: (!) 154/74   Pulse: 86   Resp: 20   Temp: 98.5 F (36.9 C)   SpO2: 98%   Weight:  131 kg  Height:  '5\' 5"'$  (1.651 m)    Final diagnoses:  ESRD on dialysis [facility])  Traumatic hematoma of right upper arm, initial encounter            Final Clinical Impression(s) / ED Diagnoses Final diagnoses:  ESRD on dialysis [facility])  Traumatic hematoma of right upper arm, initial encounter    Rx / DC Orders ED Discharge Orders     None         Deno Etienne, DO 02/09/23 1219

## 2023-02-09 NOTE — ED Triage Notes (Signed)
Pt here for dialysis. Pt states he has hematomas on right upper arm from "getting stuck." Pt states the hematomas are getting bigger and are painful.

## 2023-02-10 ENCOUNTER — Telehealth: Payer: Self-pay

## 2023-02-10 LAB — HEPATITIS B SURFACE ANTIBODY, QUANTITATIVE: Hep B S AB Quant (Post): 24.9 m[IU]/mL (ref >9.9)

## 2023-02-10 NOTE — Telephone Encounter (Signed)
Pt called c/o bulging dialysis access and veins, mild pain, and swelling. He denies any cold fingers or hand, but does endorse a purple discoloration to his fingertips. He now gets HD treatment in the ED at [facility] on T/TH/SAT. Pt has transportation which requires a 3 day notice, so he is unable to come to the office today or Monday. Instructed him to talk to the provider during HD at [facility] tomorrow. If they do not call the vascular surgeon on call, he can call the office after hours line. Instructed him to elevate and wrap with a light ACE wrap. Pt stated he's tried both and neither are effective. Pt to call back on Monday if he still needs an appt after HD tomorrow. Confirmed understanding.

## 2023-02-11 ENCOUNTER — Emergency Department (HOSPITAL_COMMUNITY)
Admission: EM | Admit: 2023-02-11 | Discharge: 2023-02-11 | Disposition: A | Discharge status: Home / Self Care | Payer: 59 | Attending: Emergency Medicine | Admitting: Emergency Medicine

## 2023-02-11 ENCOUNTER — Other Ambulatory Visit: Payer: Self-pay

## 2023-02-11 ENCOUNTER — Encounter (HOSPITAL_COMMUNITY): Payer: Self-pay

## 2023-02-11 DIAGNOSIS — Z7982 Long term (current) use of aspirin: Secondary | ICD-10-CM | POA: Diagnosis not present

## 2023-02-11 DIAGNOSIS — Z794 Long term (current) use of insulin: Secondary | ICD-10-CM | POA: Insufficient documentation

## 2023-02-11 DIAGNOSIS — E1122 Type 2 diabetes mellitus with diabetic chronic kidney disease: Secondary | ICD-10-CM | POA: Insufficient documentation

## 2023-02-11 DIAGNOSIS — N186 End stage renal disease: Secondary | ICD-10-CM | POA: Diagnosis not present

## 2023-02-11 DIAGNOSIS — T82898A Other specified complication of vascular prosthetic devices, implants and grafts, initial encounter: Secondary | ICD-10-CM | POA: Diagnosis not present

## 2023-02-11 DIAGNOSIS — I509 Heart failure, unspecified: Secondary | ICD-10-CM | POA: Insufficient documentation

## 2023-02-11 DIAGNOSIS — Z992 Dependence on renal dialysis: Secondary | ICD-10-CM

## 2023-02-11 DIAGNOSIS — R2231 Localized swelling, mass and lump, right upper limb: Secondary | ICD-10-CM | POA: Diagnosis not present

## 2023-02-11 DIAGNOSIS — Z7901 Long term (current) use of anticoagulants: Secondary | ICD-10-CM | POA: Insufficient documentation

## 2023-02-11 DIAGNOSIS — I132 Hypertensive heart and chronic kidney disease with heart failure and with stage 5 chronic kidney disease, or end stage renal disease: Secondary | ICD-10-CM | POA: Insufficient documentation

## 2023-02-11 DIAGNOSIS — J449 Chronic obstructive pulmonary disease, unspecified: Secondary | ICD-10-CM | POA: Insufficient documentation

## 2023-02-11 MED ORDER — PENTAFLUOROPROP-TETRAFLUOROETH EX AERO: 1.0000 | INHALATION_SPRAY | CUTANEOUS | Status: DC | PRN

## 2023-02-11 MED ORDER — CHLORHEXIDINE GLUCONATE CLOTH 2 % EX PADS: 6.0000 | MEDICATED_PAD | Freq: Every day | CUTANEOUS | Status: DC

## 2023-02-11 MED ORDER — HEPARIN SODIUM (PORCINE) 1000 UNIT/ML DIALYSIS: 20.0000 [IU]/kg | INTRAMUSCULAR | Status: DC | PRN

## 2023-02-11 MED ORDER — LIDOCAINE HCL (PF) 1 % IJ SOLN: 5.0000 mL | INTRAMUSCULAR | Status: DC | PRN

## 2023-02-11 MED ORDER — LIDOCAINE-PRILOCAINE 2.5-2.5 % EX CREA: 1.0000 | TOPICAL_CREAM | CUTANEOUS | Status: DC | PRN

## 2023-02-11 NOTE — ED Triage Notes (Signed)
Pt here for dialysis, pt only did 2 hrs on Thursday due to transportation issues

## 2023-02-11 NOTE — ED Provider Notes (Addendum)
[facility] Provider Note   CSN: [id number] Arrival date & time: 02/11/23  [id number]     History  No chief complaint on file.   Jeffrey [name] is a 62 y.o. male.  62 yo M with a cc of needing dialysis.  The patient has been getting dialysis through the hospital system as he unfortunately has not been allowed to go to an outpatient dialysis center due to behavior.  He last was dialyzed 2 days ago.  Denies any significant change to his baseline with the exception that he has developed some pain and swelling to the right arm.  He thinks a vascular surgeon needs to look at it.  Has had no trouble having the fistula accessed.        Home Medications Prior to Admission medications   Medication Sig Start Date End Date Taking? Authorizing Provider  amLODipine (NORVASC) 10 MG tablet Take 10 mg by mouth every morning. 04/17/20   [provider]  ASPIRIN LOW DOSE 81 MG EC tablet Take 81 mg by mouth in the morning. 02/22/21   [provider]  atorvastatin (LIPITOR) 40 MG tablet Take 40 mg by mouth in the morning. 02/09/21   [provider]  calcium acetate (PHOSLO) 667 MG capsule Take 2 capsules (1,334 mg total) by mouth with breakfast, with lunch, and with evening meal. 04/10/21   Debbe Odea, MD  carvedilol (COREG) 12.5 MG tablet Take 12.5 mg by mouth 2 (two) times daily with a meal. 04/17/20   [provider]  Cholecalciferol (VITAMIN D) 50 MCG (2000 UT) CAPS Take 2,000 Units by mouth daily.    [provider]  clopidogrel (PLAVIX) 75 MG tablet Take 1 tablet (75 mg total) by mouth daily. 11/02/22   Rhyne, Hulen Shouts, PA-C  cyclobenzaprine (FLEXERIL) 10 MG tablet Take 10 mg by mouth 3 (three) times daily as needed for muscle spasms. 12/29/21   [provider]  diazepam (VALIUM) 5 MG tablet Take 5 mg by mouth 2 (two) times daily as needed for muscle spasms. 12/09/21   [provider]  diphenhydrAMINE  (BENADRYL) 25 MG tablet Take 50 mg by mouth every 6 (six) hours as needed for itching.    [provider]  insulin regular human CONCENTRATED (HUMULIN R U-500 KWIKPEN) 500 UNIT/ML KwikPen Inject 45 Units into the skin daily with supper. Patient taking differently: Inject 140 Units into the skin 2 (two) times daily with a meal. 09/04/21 08/30/24  [name], MD  metolazone (ZAROXOLYN) 10 MG tablet Take 10 mg by mouth daily. 07/13/21   [provider]  nortriptyline (PAMELOR) 10 MG capsule Take 20 mg by mouth at bedtime. 10/23/19 08/30/24  [provider]  oxyCODONE (ROXICODONE) 5 MG immediate release tablet Take 1 tablet (5 mg total) by mouth every 8 (eight) hours as needed. 12/31/22 12/31/23  [place] [name], DO  oxymetazoline (AFRIN) 0.05 % nasal spray Place 1 spray into both nostrils daily as needed (nose bleeds).    [provider]  pantoprazole (PROTONIX) 40 MG tablet Take 40 mg by mouth daily before breakfast. 02/09/21   [provider]  pregabalin (LYRICA) 75 MG capsule Take 1 capsule (75 mg total) by mouth See admin instructions. Daily.  Give after dialysis on dialysis days. 01/21/23   [name], MD  sertraline (ZOLOFT) 100 MG tablet Take 100 mg by mouth in the morning. 02/22/21   [provider]  sodium chloride (OCEAN) 0.65 %  SOLN nasal spray Place 2 sprays into both nostrils in the morning and at bedtime.    [provider]  tadalafil (CIALIS) 20 MG tablet Take 20 mg by mouth daily as needed for erectile dysfunction.    [provider]  tamsulosin (FLOMAX) 0.4 MG CAPS capsule Take 1 capsule (0.4 mg total) by mouth daily. 11/10/21   Darliss Cheney, MD  torsemide (DEMADEX) 20 MG tablet Take 120 mg by mouth 2 (two) times daily.    [provider]  traZODone (DESYREL) 150 MG tablet Take 150 mg by mouth at bedtime. 12/09/21   [provider]      Allergies    Actos [pioglitazone], Dexmedetomidine, Ibuprofen,  Tomato, and Wellbutrin [bupropion]    Review of Systems   Review of Systems  Physical Exam Updated Vital Signs BP (!) 151/73   Pulse 84   Temp 98.3 F (36.8 C) (Oral)   Resp 16   SpO2 100%  Physical Exam Vitals and nursing note reviewed.  Constitutional:      Appearance: He is well-developed.  HENT:     Head: Normocephalic and atraumatic.  Eyes:     Pupils: Pupils are equal, round, and reactive to light.  Neck:     Vascular: No JVD.  Cardiovascular:     Rate and Rhythm: Normal rate and regular rhythm.     Heart sounds: No murmur heard.    No friction rub. No gallop.  Pulmonary:     Effort: No respiratory distress.     Breath sounds: No wheezing.  Abdominal:     General: There is no distension.     Tenderness: There is no abdominal tenderness. There is no guarding or rebound.  Musculoskeletal:        General: Normal range of motion.     Cervical back: Normal range of motion and neck supple.     Comments: Right AV fistula with palpable thrill.  Patient does have areas that appear to be hematomas surrounding.  Able to range without significant discomfort.  Bilateral AKA.  Skin:    Coloration: Skin is not pale.     Findings: No rash.  Neurological:     Mental Status: He is alert and oriented to person, place, and time.  Psychiatric:        Behavior: Behavior normal.     ED Results / Procedures / Treatments   Labs (all labs ordered are listed, but only abnormal results are displayed) Labs Reviewed  HEPATITIS B SURFACE ANTIGEN  HEPATITIS B SURFACE ANTIBODY, QUANTITATIVE    EKG None  Radiology No results found.  Procedures Procedures    Medications Ordered in ED Medications  Chlorhexidine Gluconate Cloth 2 % PADS 6 each (has no administration in time range)  pentafluoroprop-tetrafluoroeth (GEBAUERS) aerosol 1 Application (has no administration in time range)  lidocaine (PF) (XYLOCAINE) 1 % injection 5 mL (has no administration in time range)   lidocaine-prilocaine (EMLA) cream 1 Application (has no administration in time range)  heparin injection 2,600 Units (has no administration in time range)    ED Course/ Medical Decision Making/ A&P                             Medical Decision Making  62 yo M incisional disease on dialysis Tuesday Thursday and Saturday is here for his routine dialysis session.  His only added complaint is pain and swelling to the right arm which is where his AV  fistula is.  Clinically patient likely has a hematoma.  Likely due to dialysis access.  Chronically on oxygen.  No increased shortness of breath with patient.  Will discuss with nephrology. Discussed with Dr. Joelyn Oms, will arrange for dialysis.  Patient is a bit more insistent about having vascular involved with the bruising about his right upper arm.  I did look in his chart and he had called the vascular surgery office yesterday.  I did discuss this with Dr. Joelyn Oms who had evaluated and had no obvious acute complication and had suggested he follow-up as an outpatient.  The patient was seen here by Dr. Donzetta Matters, vascular surgery.  Will schedule an outpatient procedure.  Patient has returned from dialysis and feels better.  Will discharge home.  The patients results and plan were reviewed and discussed.   Any x-rays performed were independently reviewed by myself.   Differential diagnosis were considered with the presenting HPI.  Medications  Chlorhexidine Gluconate Cloth 2 % PADS 6 each (has no administration in time range)  pentafluoroprop-tetrafluoroeth (GEBAUERS) aerosol 1 Application (has no administration in time range)  lidocaine (PF) (XYLOCAINE) 1 % injection 5 mL (has no administration in time range)  lidocaine-prilocaine (EMLA) cream 1 Application (has no administration in time range)  heparin injection 2,600 Units (has no administration in time range)    Vitals:   02/11/23 1200 02/11/23 1230 02/11/23 1300 02/11/23 1330  BP: (!)  143/65 (!) 153/69 (!) 147/61 (!) 151/73  Pulse: 80 78 87 84  Resp: 18 13 14 16   Temp:      TempSrc:      SpO2: 100% 100% 100% 100%    Final diagnoses:  ESRD (end stage renal disease) on dialysis [facility])            Final Clinical Impression(s) / ED Diagnoses Final diagnoses:  ESRD (end stage renal disease) on dialysis [facility])    Rx / DC Orders ED Discharge Orders     None            Deno Etienne, DO 02/11/23 Wallace, [place], DO 02/11/23 1512

## 2023-02-11 NOTE — Discharge Instructions (Signed)
Follow-up with vascular 

## 2023-02-11 NOTE — Progress Notes (Signed)
Received patient in bed to unit.  Alert and oriented.  Informed consent signed and in chart.   [place] duration:4. Pt cut off 30 mins d/t tired. Pt refused to sign AMA form.  Patient tolerated well.  Transported back to the room  Alert, without acute distress.  Hand-off given to patient's nurse.   Access used: AVF Access issues: none  Total UF removed: 2.2 L  Medication(s) given: none Post HD VS: 140/78 P 80 R 20 . O2 sat 100 % in 3 L O2 using with nasal cannula. Post HD weight: 129 kg   [facility]

## 2023-02-11 NOTE — Consult Note (Signed)
Hospital Consult    Reason for Consult: Right arm swelling Referring Physician: Dr. Tyrone Nine MRN #:  [id number]  History of Present Illness: This is a 62 y.o. male with history of end-stage renal disease currently on dialysis via right arm AV graft that was initially a loop graft and then converted to a brachial artery to axillary vein dialysis graft here at [facility].  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  More recently a catheter was placed to allow for dialysis.  He is now here for his dialysis on Tuesdays, Thursdays and Saturdays this he obtains his dialysis through the emergency states that his swelling has recurred and he is hopeful to have something done to take the graft out of his right arm.  He is on aspirin and Plavix does not take any blood thinners.  Past Medical History:  Diagnosis Date   Altered mental status 05/04/2021   Arthritis    Blood transfusion without reported diagnosis    CHF (congestive heart failure) (HCC)    COPD (chronic obstructive pulmonary disease) ([place])    Diabetes mellitus without complication ([place])    ESRD on hemodialysis ([place])    Hypertension    PTSD (post-traumatic stress disorder)    Sepsis ([place])     Past Surgical History:  Procedure Laterality Date   A/V FISTULAGRAM N/A 08/30/2021   Procedure: A/V FISTULAGRAM;  Surgeon: Waynetta Sandy, MD;  Location: [place] [facility];  Service: Cardiovascular;  Laterality: N/A;   A/V FISTULAGRAM Right 07/28/2022   Procedure: A/V Fistulagram;  Surgeon: Waynetta Sandy, MD;  Location: [place] [facility];  Service: Cardiovascular;  Laterality: Right;   A/V FISTULAGRAM Right 09/05/2022   Procedure: A/V Fistulagram;  Surgeon: Waynetta Sandy, MD;  Location: [place] [facility];  Service: Cardiovascular;  Laterality: Right;   A/V FISTULAGRAM Right  10/31/2022   Procedure: A/V Fistulagram;  Surgeon: Waynetta Sandy, MD;  Location: [place] [facility];  Service: Cardiovascular;  Laterality: Right;   A/V SHUNT INTERVENTION Right 10/31/2022   Procedure: A/V SHUNT INTERVENTION;  Surgeon: Waynetta Sandy, MD;  Location: [place] [facility];  Service: Cardiovascular;  Laterality: Right;   A/V SHUNTOGRAM N/A 11/08/2021   Procedure: A/V SHUNTOGRAM;  Surgeon: Waynetta Sandy, MD;  Location: [place] [facility];  Service: Cardiovascular;  Laterality: N/A;   AMPUTATION Left 04/03/2021   Procedure: LEFT ABOVE-THE-KNEE AMPUTATION;  Surgeon: Erle Crocker, MD;  Location: [place];  Service: Orthopedics;  Laterality: Left;   AV FISTULA PLACEMENT Right 08/12/2021   Procedure: INSERTION OF ARTERIOVENOUS (AV) GORE-TEX GRAFT RIGHT ARM;  Surgeon: Serafina Mitchell, MD;  Location: [place];  Service: Vascular;  Laterality: Right;   INSERTION OF DIALYSIS CATHETER N/A 11/25/2022   Procedure: INSERTION OF DIALYSIS CATHETER;  Surgeon: Candee Furbish, MD;  Location: [facility] ENDOSCOPY;  Service: Pulmonary;  Laterality: N/A;   IR REMOVAL TUN CV CATH W/O FL  03/30/2021   JOINT REPLACEMENT     Bilateral knees   PERIPHERAL VASCULAR BALLOON ANGIOPLASTY Right 08/30/2021   Procedure: PERIPHERAL VASCULAR BALLOON ANGIOPLASTY;  Surgeon: Waynetta Sandy, MD;  Location: [place] [facility];  Service: Cardiovascular;  Laterality: Right;   PERIPHERAL VASCULAR BALLOON ANGIOPLASTY Right 07/28/2022   Procedure: PERIPHERAL VASCULAR BALLOON ANGIOPLASTY;  Surgeon: Waynetta Sandy, MD;  Location: [place] [facility]  [facility];  Service: Cardiovascular;  Laterality: Right;  AVF   PERIPHERAL VASCULAR INTERVENTION  11/08/2021   Procedure: PERIPHERAL VASCULAR INTERVENTION;  Surgeon: Waynetta Sandy, MD;  Location: [place] [facility];  Service: Cardiovascular;;  Central rt arm fistula   UPPER EXTREMITY VENOGRAPHY Right 08/10/2021   Procedure: UPPER EXTREMITY  VENOGRAPHY;  Surgeon: Serafina Mitchell, MD;  Location: [place] [facility];  Service: Cardiovascular;  Laterality: Right;    Allergies  Allergen Reactions   Actos [Pioglitazone] Anaphylaxis and Rash   Dexmedetomidine Nausea And Vomiting    (Precedex) Dose-limiting bradycardia     Ibuprofen Shortness Of Breath and Swelling    Pt tolerates aspirin    Tomato Anaphylaxis    Only allergic to RAW tomatoes   Wellbutrin [Bupropion] Swelling    Prior to Admission medications   Medication Sig Start Date End Date Taking? Authorizing Provider  amLODipine (NORVASC) 10 MG tablet Take 10 mg by mouth every morning. 04/17/20   [provider]  ASPIRIN LOW DOSE 81 MG EC tablet Take 81 mg by mouth in the morning. 02/22/21   [provider]  atorvastatin (LIPITOR) 40 MG tablet Take 40 mg by mouth in the morning. 02/09/21   [provider]  calcium acetate (PHOSLO) 667 MG capsule Take 2 capsules (1,334 mg total) by mouth with breakfast, with lunch, and with evening meal. 04/10/21   Debbe Odea, MD  carvedilol (COREG) 12.5 MG tablet Take 12.5 mg by mouth 2 (two) times daily with a meal. 04/17/20   [provider]  Cholecalciferol (VITAMIN D) 50 MCG (2000 UT) CAPS Take 2,000 Units by mouth daily.    [provider]  clopidogrel (PLAVIX) 75 MG tablet Take 1 tablet (75 mg total) by mouth daily. 11/02/22   Rhyne, Hulen Shouts, PA-C  cyclobenzaprine (FLEXERIL) 10 MG tablet Take 10 mg by mouth 3 (three) times daily as needed for muscle spasms. 12/29/21   [provider]  diazepam (VALIUM) 5 MG tablet Take 5 mg by mouth 2 (two) times daily as needed for muscle spasms. 12/09/21   [provider]  diphenhydrAMINE (BENADRYL) 25 MG tablet Take 50 mg by mouth every 6 (six) hours as needed for itching.    [provider]  insulin regular human CONCENTRATED (HUMULIN R U-500 KWIKPEN) 500 UNIT/ML KwikPen Inject 45 Units into the skin daily with supper. Patient  taking differently: Inject 140 Units into the skin 2 (two) times daily with a meal. 09/04/21 08/30/24  [name], MD  metolazone (ZAROXOLYN) 10 MG tablet Take 10 mg by mouth daily. 07/13/21   [provider]  nortriptyline (PAMELOR) 10 MG capsule Take 20 mg by mouth at bedtime. 10/23/19 08/30/24  [provider]  oxyCODONE (ROXICODONE) 5 MG immediate release tablet Take 1 tablet (5 mg total) by mouth every 8 (eight) hours as needed. 12/31/22 12/31/23  [place] [name], DO  oxymetazoline (AFRIN) 0.05 % nasal spray Place 1 spray into both nostrils daily as needed (nose bleeds).    [provider]  pantoprazole (PROTONIX) 40 MG tablet Take 40 mg by mouth daily before breakfast. 02/09/21   [provider]  pregabalin (LYRICA) 75 MG capsule Take 1 capsule (75 mg total) by mouth See admin instructions. Daily.  Give after dialysis on dialysis days. 01/21/23   [name], MD  sertraline (ZOLOFT) 100 MG tablet Take 100 mg by mouth in the morning. 02/22/21   [provider]  sodium chloride (OCEAN) 0.65 % SOLN nasal  spray Place 2 sprays into both nostrils in the morning and at bedtime.    [provider]  tadalafil (CIALIS) 20 MG tablet Take 20 mg by mouth daily as needed for erectile dysfunction.    [provider]  tamsulosin (FLOMAX) 0.4 MG CAPS capsule Take 1 capsule (0.4 mg total) by mouth daily. 11/10/21   Darliss Cheney, MD  torsemide (DEMADEX) 20 MG tablet Take 120 mg by mouth 2 (two) times daily.    [provider]  traZODone (DESYREL) 150 MG tablet Take 150 mg by mouth at bedtime. 12/09/21   [provider]    Social History   Socioeconomic History   Marital status: Significant Other    Spouse name: Not on file   Number of children: Not on file   Years of education: Not on file   Highest education level: Not on file  Occupational History   Occupation: disabled  Tobacco Use   Smoking status: Former    Types:  Cigarettes    Quit date: 2012    Years since quitting: 12.2   Smokeless tobacco: Never  Vaping Use   Vaping Use: Never used  Substance and Sexual Activity   Alcohol use: Yes    Comment: occasionally   Drug use: Not Currently    Comment: Years ago   Sexual activity: Not on file  Other Topics Concern   Not on file  Social History Narrative   Not on file   Social Determinants of Health   Financial Resource Strain: Not on file  Food Insecurity: No Food Insecurity (11/24/2022)   Hunger Vital Sign    Worried About Running Out of Food in the Last Year: Never true    Ran Out of Food in the Last Year: Never true  Transportation Needs: No Transportation Needs (11/24/2022)   PRAPARE - Hydrologist (Medical): No    Lack of Transportation (Non-Medical): No  Physical Activity: Not on file  Stress: Not on file  Social Connections: Not on file  Intimate Partner Violence: Not At Risk (11/24/2022)   Humiliation, Afraid, Rape, and Kick questionnaire    Fear of Current or Ex-Partner: No    Emotionally Abused: No    Physically Abused: No    Sexually Abused: No     History reviewed. No pertinent family history.  Review of Systems  Constitutional: Negative.   HENT: Negative.    Eyes: Negative.   Respiratory: Negative.    Cardiovascular: Negative.   Gastrointestinal: Negative.   Musculoskeletal:        Right arm swelling  Skin: Negative.   Neurological: Negative.   Endo/Heme/Allergies: Negative.   Psychiatric/Behavioral: Negative.        Physical Examination  Vitals:   02/11/23 1038 02/11/23 1050  BP: 136/66 131/72  Pulse: 84 87  Resp: 20 20  Temp: 98.3 F (36.8 C)   SpO2: 97% 100%   There is no height or weight on file to calculate BMI.  Physical Exam HENT:     Head: Normocephalic.     Nose: Nose normal.  Eyes:     Pupils: Pupils are equal, round, and reactive to light.  Cardiovascular:     Rate and Rhythm: Normal rate.  Pulmonary:      Effort: Pulmonary effort is normal.  Abdominal:     General: Abdomen is flat.  Musculoskeletal:     Comments: Strong thrill in right arm AV graft Mild edema right hand  Skin:  Capillary Refill: Capillary refill takes less than 2 seconds.  Neurological:     General: No focal deficit present.     Mental Status: He is alert.  Psychiatric:        Mood and Affect: Mood normal.        Behavior: Behavior normal.        Thought Content: Thought content normal.      CBC    Component Value Date/Time   WBC 9.6 02/09/2023 1311   RBC 3.48 (L) 02/09/2023 1311   HGB 10.1 (L) 02/09/2023 1311   HCT 31.0 (L) 02/09/2023 1311   PLT 147 (L) 02/09/2023 1311   MCV 89.1 02/09/2023 1311   MCH 29.0 02/09/2023 1311   MCHC 32.6 02/09/2023 1311   RDW 14.6 02/09/2023 1311   LYMPHSABS 0.9 01/26/2023 0736   MONOABS 0.5 01/26/2023 0736   EOSABS 0.2 01/26/2023 0736   BASOSABS 0.0 01/26/2023 0736    BMET    Component Value Date/Time   NA 137 02/09/2023 1311   K 3.4 (L) 02/09/2023 1311   CL 94 (L) 02/09/2023 1311   CO2 28 02/09/2023 1311   GLUCOSE 237 (H) 02/09/2023 1311   BUN 76 (H) 02/09/2023 1311   CREATININE 5.02 (H) 02/09/2023 1311   CALCIUM 8.2 (L) 02/09/2023 1311   GFRNONAA 12 (L) 02/09/2023 1311    COAGS: Lab Results  Component Value Date   INR 1.1 10/19/2022   INR 1.2 03/28/2021     Non-Invasive Vascular Imaging:   No studies  ASSESSMENT/PLAN: This is a 62 y.o. male history of left upper extremity access that was ligated for steal now on dialysis via right arm AV graft he has had significant issues with swelling in the right arm and required stent of the right innominate vein with multiple reinterventions.  This time patient is hopeful to move on from the right arm AV graft offered him the options of ligation of the graft with catheter and consideration of left upper extremity in the future versus attempt to place hero graft through the existing stent.  Patient is hopeful to  have ligation of the graft with placement of catheter on a nondialysis day in the near future she is scheduled to  [place]. Donzetta Matters, MD [facility] [place] Office: [phone] Pager: [phone]

## 2023-02-11 NOTE — H&P (View-Only) (Signed)
Hospital Consult    Reason for Consult: Right arm swelling Referring Physician: Dr. Floyd MRN #:  [id number]  History of Present Illness: This is a 61 y.o. male with history of end-stage renal disease currently on dialysis via right arm AV graft that was initially a loop graft and then converted to a brachial artery to axillary vein dialysis graft here at [place].  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  More recently a catheter was placed to allow for dialysis.  He is now here for his dialysis on Tuesdays, Thursdays and Saturdays this he obtains his dialysis through the emergency states that his swelling has recurred and he is hopeful to have something done to take the graft out of his right arm.  He is on aspirin and Plavix does not take any blood thinners.  Past Medical History:  Diagnosis Date   Altered mental status 05/04/2021   Arthritis    Blood transfusion without reported diagnosis    CHF (congestive heart failure) (HCC)    COPD (chronic obstructive pulmonary disease) (HCC)    Diabetes mellitus without complication (HCC)    ESRD on hemodialysis (HCC)    Hypertension    PTSD (post-traumatic stress disorder)    Sepsis (HCC)     Past Surgical History:  Procedure Laterality Date   A/V FISTULAGRAM N/A 08/30/2021   Procedure: A/V FISTULAGRAM;  Surgeon: Madhav Mohon Christopher, MD;  [facility];  Service: Cardiovascular;  Laterality: N/A;   A/V FISTULAGRAM Right 07/28/2022   Procedure: A/V Fistulagram;  Surgeon: Jermayne Sweeney Christopher, MD;  [facility]: [facility];  Service: Cardiovascular;  Laterality: Right;   A/V FISTULAGRAM Right 09/05/2022   Procedure: A/V Fistulagram;  Surgeon: Porcha Deblanc Christopher, MD;  [facility];  Service: Cardiovascular;  Laterality: Right;   A/V FISTULAGRAM Right  10/31/2022   Procedure: A/V Fistulagram;  Surgeon: Javis Abboud Christopher, MD;  [facility];  Service: Cardiovascular;  Laterality: Right;   A/V SHUNT INTERVENTION Right 10/31/2022   Procedure: A/V SHUNT INTERVENTION;  Surgeon: Hadlie Gipson Christopher, MD;  [facility];  Service: Cardiovascular;  Laterality: Right;   A/V SHUNTOGRAM N/A 11/08/2021   Procedure: A/V SHUNTOGRAM;  Surgeon: Deiona Hooper Christopher, MD;  [facility];  Service: Cardiovascular;  Laterality: N/A;   AMPUTATION Left 04/03/2021   Procedure: LEFT ABOVE-THE-KNEE AMPUTATION;  Surgeon: Adair, Christopher R, MD;  [facility];  Service: Orthopedics;  Laterality: Left;   AV FISTULA PLACEMENT Right 08/12/2021   Procedure: INSERTION OF ARTERIOVENOUS (AV) GORE-TEX GRAFT RIGHT ARM;  Surgeon: Brabham, Vance W, MD;  [facility];  Service: Vascular;  Laterality: Right;   INSERTION OF DIALYSIS CATHETER N/A 11/25/2022   Procedure: INSERTION OF DIALYSIS CATHETER;  Surgeon: Smith, Daniel C, MD;  [facility];  Service: Pulmonary;  Laterality: N/A;   IR REMOVAL TUN CV CATH W/O FL  03/30/2021   JOINT REPLACEMENT     Bilateral knees   PERIPHERAL VASCULAR BALLOON ANGIOPLASTY Right 08/30/2021   Procedure: PERIPHERAL VASCULAR BALLOON ANGIOPLASTY;  Surgeon: Emerick Weatherly Christopher, MD;  [facility];  Service: Cardiovascular;  Laterality: Right;   PERIPHERAL VASCULAR BALLOON ANGIOPLASTY Right 07/28/2022   Procedure: PERIPHERAL VASCULAR BALLOON ANGIOPLASTY;  Surgeon: Davonte Siebenaler Christopher, MD;  [facility];  Service: Cardiovascular;  Laterality: Right;  AVF   PERIPHERAL VASCULAR INTERVENTION  11/08/2021   Procedure: PERIPHERAL VASCULAR INTERVENTION;  Surgeon: Nahome Bublitz Christopher, MD;  [facility];  Service: Cardiovascular;;  Central rt arm fistula   UPPER EXTREMITY VENOGRAPHY Right 08/10/2021   Procedure: UPPER EXTREMITY  VENOGRAPHY;  Surgeon: Brabham, Vance W, MD;  [facility];  Service: Cardiovascular;  Laterality: Right;    Allergies  Allergen Reactions   Actos [Pioglitazone] Anaphylaxis and Rash   Dexmedetomidine Nausea And Vomiting    (Precedex) Dose-limiting bradycardia     Ibuprofen Shortness Of Breath and Swelling    Pt tolerates aspirin    Tomato Anaphylaxis    Only allergic to RAW tomatoes   Wellbutrin [Bupropion] Swelling    Prior to Admission medications   Medication Sig Start Date End Date Taking? Authorizing Provider  amLODipine (NORVASC) 10 MG tablet Take 10 mg by mouth every morning. 04/17/20   [provider]  ASPIRIN LOW DOSE 81 MG EC tablet Take 81 mg by mouth in the morning. 02/22/21   [provider]  atorvastatin (LIPITOR) 40 MG tablet Take 40 mg by mouth in the morning. 02/09/21   [provider]  calcium acetate (PHOSLO) 667 MG capsule Take 2 capsules (1,334 mg total) by mouth with breakfast, with lunch, and with evening meal. 04/10/21   Rizwan, Saima, MD  carvedilol (COREG) 12.5 MG tablet Take 12.5 mg by mouth 2 (two) times daily with a meal. 04/17/20   [provider]  Cholecalciferol (VITAMIN D) 50 MCG (2000 UT) CAPS Take 2,000 Units by mouth daily.    [provider]  clopidogrel (PLAVIX) 75 MG tablet Take 1 tablet (75 mg total) by mouth daily. 11/02/22   Rhyne, Samantha J, PA-C  cyclobenzaprine (FLEXERIL) 10 MG tablet Take 10 mg by mouth 3 (three) times daily as needed for muscle spasms. 12/29/21   [provider]  diazepam (VALIUM) 5 MG tablet Take 5 mg by mouth 2 (two) times daily as needed for muscle spasms. 12/09/21   [provider]  diphenhydrAMINE (BENADRYL) 25 MG tablet Take 50 mg by mouth every 6 (six) hours as needed for itching.    [provider]  insulin regular human CONCENTRATED (HUMULIN R U-500 KWIKPEN) 500 UNIT/ML KwikPen Inject 45 Units into the skin daily with supper. Patient  taking differently: Inject 140 Units into the skin 2 (two) times daily with a meal. 09/04/21 08/30/24  Ogbata, Sylvester I, MD  metolazone (ZAROXOLYN) 10 MG tablet Take 10 mg by mouth daily. 07/13/21   [provider]  nortriptyline (PAMELOR) 10 MG capsule Take 20 mg by mouth at bedtime. 10/23/19 08/30/24  [provider]  oxyCODONE (ROXICODONE) 5 MG immediate release tablet Take 1 tablet (5 mg total) by mouth every 8 (eight) hours as needed. 12/31/22 12/31/23  [place], Eric J, DO  oxymetazoline (AFRIN) 0.05 % nasal spray Place 1 spray into both nostrils daily as needed (nose bleeds).    [provider]  pantoprazole (PROTONIX) 40 MG tablet Take 40 mg by mouth daily before breakfast. 02/09/21   [provider]  pregabalin (LYRICA) 75 MG capsule Take 1 capsule (75 mg total) by mouth See admin instructions. Daily.  Give after dialysis on dialysis days. 01/21/23   Knapp, Jon, MD  sertraline (ZOLOFT) 100 MG tablet Take 100 mg by mouth in the morning. 02/22/21   [provider]  sodium chloride (OCEAN) 0.65 % SOLN nasal   spray Place 2 sprays into both nostrils in the morning and at bedtime.    [provider]  tadalafil (CIALIS) 20 MG tablet Take 20 mg by mouth daily as needed for erectile dysfunction.    [provider]  tamsulosin (FLOMAX) 0.4 MG CAPS capsule Take 1 capsule (0.4 mg total) by mouth daily. 11/10/21   Pahwani, Ravi, MD  torsemide (DEMADEX) 20 MG tablet Take 120 mg by mouth 2 (two) times daily.    [provider]  traZODone (DESYREL) 150 MG tablet Take 150 mg by mouth at bedtime. 12/09/21   [provider]    Social History   Socioeconomic History   Marital status: Significant Other    Spouse name: Not on file   Number of children: Not on file   Years of education: Not on file   Highest education level: Not on file  Occupational History   Occupation: disabled  Tobacco Use   Smoking status: Former    Types:  Cigarettes    Quit date: 2012    Years since quitting: 12.2   Smokeless tobacco: Never  Vaping Use   Vaping Use: Never used  Substance and Sexual Activity   Alcohol use: Yes    Comment: occasionally   Drug use: Not Currently    Comment: Years ago   Sexual activity: Not on file  Other Topics Concern   Not on file  Social History Narrative   Not on file   Social Determinants of Health   Financial Resource Strain: Not on file  Food Insecurity: No Food Insecurity (11/24/2022)   Hunger Vital Sign    Worried About Running Out of Food in the Last Year: Never true    Ran Out of Food in the Last Year: Never true  Transportation Needs: No Transportation Needs (11/24/2022)   PRAPARE - Transportation    Lack of Transportation (Medical): No    Lack of Transportation (Non-Medical): No  Physical Activity: Not on file  Stress: Not on file  Social Connections: Not on file  Intimate Partner Violence: Not At Risk (11/24/2022)   Humiliation, Afraid, Rape, and Kick questionnaire    Fear of Current or Ex-Partner: No    Emotionally Abused: No    Physically Abused: No    Sexually Abused: No     History reviewed. No pertinent family history.  Review of Systems  Constitutional: Negative.   HENT: Negative.    Eyes: Negative.   Respiratory: Negative.    Cardiovascular: Negative.   Gastrointestinal: Negative.   Musculoskeletal:        Right arm swelling  Skin: Negative.   Neurological: Negative.   Endo/Heme/Allergies: Negative.   Psychiatric/Behavioral: Negative.        Physical Examination  Vitals:   02/11/23 1038 02/11/23 1050  BP: 136/66 131/72  Pulse: 84 87  Resp: 20 20  Temp: 98.3 F (36.8 C)   SpO2: 97% 100%   There is no height or weight on file to calculate BMI.  Physical Exam HENT:     Head: Normocephalic.     Nose: Nose normal.  Eyes:     Pupils: Pupils are equal, round, and reactive to light.  Cardiovascular:     Rate and Rhythm: Normal rate.  Pulmonary:      Effort: Pulmonary effort is normal.  Abdominal:     General: Abdomen is flat.  Musculoskeletal:     Comments: Strong thrill in right arm AV graft Mild edema right hand  Skin:      Capillary Refill: Capillary refill takes less than 2 seconds.  Neurological:     General: No focal deficit present.     Mental Status: He is alert.  Psychiatric:        Mood and Affect: Mood normal.        Behavior: Behavior normal.        Thought Content: Thought content normal.      CBC    Component Value Date/Time   WBC 9.6 02/09/2023 1311   RBC 3.48 (L) 02/09/2023 1311   HGB 10.1 (L) 02/09/2023 1311   HCT 31.0 (L) 02/09/2023 1311   PLT 147 (L) 02/09/2023 1311   MCV 89.1 02/09/2023 1311   MCH 29.0 02/09/2023 1311   MCHC 32.6 02/09/2023 1311   RDW 14.6 02/09/2023 1311   LYMPHSABS 0.9 01/26/2023 0736   MONOABS 0.5 01/26/2023 0736   EOSABS 0.2 01/26/2023 0736   BASOSABS 0.0 01/26/2023 0736    BMET    Component Value Date/Time   NA 137 02/09/2023 1311   K 3.4 (L) 02/09/2023 1311   CL 94 (L) 02/09/2023 1311   CO2 28 02/09/2023 1311   GLUCOSE 237 (H) 02/09/2023 1311   BUN 76 (H) 02/09/2023 1311   CREATININE 5.02 (H) 02/09/2023 1311   CALCIUM 8.2 (L) 02/09/2023 1311   GFRNONAA 12 (L) 02/09/2023 1311    COAGS: Lab Results  Component Value Date   INR 1.1 10/19/2022   INR 1.2 03/28/2021     Non-Invasive Vascular Imaging:   No studies  ASSESSMENT/PLAN: This is a 61 y.o. male history of left upper extremity access that was ligated for steal now on dialysis via right arm AV graft he has had significant issues with swelling in the right arm and required stent of the right innominate vein with multiple reinterventions.  This time patient is hopeful to move on from the right arm AV graft offered him the options of ligation of the graft with catheter and consideration of left upper extremity in the future versus attempt to place hero graft through the existing stent.  Patient is hopeful to  have ligation of the graft with placement of catheter on a nondialysis day in the near future she is scheduled to  Zala Degrasse C. Shaquoya Cosper, MD [facility] [place] Office: [phone] Pager: [phone]  

## 2023-02-13 ENCOUNTER — Other Ambulatory Visit: Payer: Self-pay

## 2023-02-13 DIAGNOSIS — N186 End stage renal disease: Secondary | ICD-10-CM

## 2023-02-14 ENCOUNTER — Emergency Department (HOSPITAL_COMMUNITY)
Admission: EM | Admit: 2023-02-14 | Discharge: 2023-02-14 | Disposition: A | Discharge status: Home / Self Care | Payer: 59 | Attending: Emergency Medicine | Admitting: Emergency Medicine

## 2023-02-14 ENCOUNTER — Encounter (HOSPITAL_COMMUNITY): Payer: Self-pay

## 2023-02-14 ENCOUNTER — Other Ambulatory Visit: Payer: Self-pay

## 2023-02-14 DIAGNOSIS — I509 Heart failure, unspecified: Secondary | ICD-10-CM | POA: Insufficient documentation

## 2023-02-14 DIAGNOSIS — J449 Chronic obstructive pulmonary disease, unspecified: Secondary | ICD-10-CM | POA: Diagnosis not present

## 2023-02-14 DIAGNOSIS — Z794 Long term (current) use of insulin: Secondary | ICD-10-CM | POA: Insufficient documentation

## 2023-02-14 DIAGNOSIS — I132 Hypertensive heart and chronic kidney disease with heart failure and with stage 5 chronic kidney disease, or end stage renal disease: Secondary | ICD-10-CM | POA: Insufficient documentation

## 2023-02-14 DIAGNOSIS — Z79899 Other long term (current) drug therapy: Secondary | ICD-10-CM | POA: Diagnosis not present

## 2023-02-14 DIAGNOSIS — Z992 Dependence on renal dialysis: Secondary | ICD-10-CM | POA: Diagnosis not present

## 2023-02-14 DIAGNOSIS — Z7902 Long term (current) use of antithrombotics/antiplatelets: Secondary | ICD-10-CM | POA: Diagnosis not present

## 2023-02-14 DIAGNOSIS — Z7982 Long term (current) use of aspirin: Secondary | ICD-10-CM | POA: Insufficient documentation

## 2023-02-14 DIAGNOSIS — E1122 Type 2 diabetes mellitus with diabetic chronic kidney disease: Secondary | ICD-10-CM | POA: Diagnosis not present

## 2023-02-14 DIAGNOSIS — N186 End stage renal disease: Secondary | ICD-10-CM | POA: Diagnosis not present

## 2023-02-14 DIAGNOSIS — R0602 Shortness of breath: Secondary | ICD-10-CM | POA: Diagnosis present

## 2023-02-14 LAB — RENAL FUNCTION PANEL
Albumin: 3.5 g/dL (ref 3.5–5.0)
Anion gap: 12 (ref 5–15)
BUN: 77 mg/dL — ABNORMAL HIGH (ref 8–23)
CO2: 27 mmol/L (ref 22–32)
Calcium: 8.4 mg/dL — ABNORMAL LOW (ref 8.9–10.3)
Chloride: 94 mmol/L — ABNORMAL LOW (ref 98–111)
Creatinine, Ser: 5.1 mg/dL — ABNORMAL HIGH (ref 0.61–1.24)
GFR, Estimated: 12 mL/min — ABNORMAL LOW (ref >60)
Glucose, Bld: 275 mg/dL — ABNORMAL HIGH (ref 70–99)
Phosphorus: 4.5 mg/dL (ref 2.5–4.6)
Potassium: 3.5 mmol/L (ref 3.5–5.1)
Sodium: 133 mmol/L — ABNORMAL LOW (ref 135–145)

## 2023-02-14 LAB — CBC
HCT: 27.2 % — ABNORMAL LOW (ref 39.0–52.0)
Hemoglobin: 8.7 g/dL — ABNORMAL LOW (ref 13.0–17.0)
MCH: 28.7 pg (ref 26.0–34.0)
MCHC: 32 g/dL (ref 30.0–36.0)
MCV: 89.8 fL (ref 80.0–100.0)
Platelets: 119 10*3/uL — ABNORMAL LOW (ref 150–400)
RBC: 3.03 MIL/uL — ABNORMAL LOW (ref 4.22–5.81)
RDW: 14.4 % (ref 11.5–15.5)
WBC: 7.7 10*3/uL (ref 4.0–10.5)
nRBC: 0 % (ref 0.0–0.2)

## 2023-02-14 LAB — HEPATITIS B SURFACE ANTIGEN: Hepatitis B Surface Ag: NONREACTIVE

## 2023-02-14 MED ORDER — ANTICOAGULANT SODIUM CITRATE 4% (200MG/5ML) IV SOLN: 5.0000 mL | Status: DC | PRN

## 2023-02-14 MED ORDER — LIDOCAINE-PRILOCAINE 2.5-2.5 % EX CREA: 1.0000 | TOPICAL_CREAM | CUTANEOUS | Status: DC | PRN

## 2023-02-14 MED ORDER — PENTAFLUOROPROP-TETRAFLUOROETH EX AERO: 1.0000 | INHALATION_SPRAY | CUTANEOUS | Status: DC | PRN

## 2023-02-14 MED ORDER — ALTEPLASE 2 MG IJ SOLR: 2.0000 mg | Freq: Once | INTRAMUSCULAR | Status: DC | PRN

## 2023-02-14 MED ORDER — CHLORHEXIDINE GLUCONATE CLOTH 2 % EX PADS: 6.0000 | MEDICATED_PAD | Freq: Every day | CUTANEOUS | Status: DC

## 2023-02-14 MED ORDER — LIDOCAINE HCL (PF) 1 % IJ SOLN: 5.0000 mL | INTRAMUSCULAR | Status: DC | PRN

## 2023-02-14 MED ORDER — HEPARIN SODIUM (PORCINE) 1000 UNIT/ML DIALYSIS
2000.0000 [IU] | INTRAMUSCULAR | Status: DC | PRN
  Administered 2023-02-14: 2000 [IU] via INTRAVENOUS_CENTRAL
  Filled 2023-02-14 (×2): qty 2

## 2023-02-14 MED ORDER — HEPARIN SODIUM (PORCINE) 1000 UNIT/ML DIALYSIS: 1000.0000 [IU] | INTRAMUSCULAR | Status: DC | PRN

## 2023-02-14 NOTE — Plan of Care (Signed)
BAYLON SALB 09/16/1961 [id number]  Patient in the ED for routine dialysis.  He was discharged from OP HD unit due to behavioral issues.  Plan for HD upstairs then return to ED for reassessment and expected discharge home.   Last OP HD orders from dec 2023:  3:15h  129kg  RUE AVG   Heparin 2000   Jen Mow, PA-C [employer]

## 2023-02-14 NOTE — Procedures (Signed)
HD Note:  Some information was entered later than the data was gathered due to patient care needs. The stated time with the data is accurate.  Received patient in bed to unit.  Alert and oriented.  Informed consent signed and in chart.   TX duration: 3.25 hours  Patient tolerated well.  Transported back to the room  Alert, without acute distress.  Hand-off given to patient's nurse.   Access used: Right upper forearm fistula Access issues: None  Total UF removed: 3000 ml    [facility]

## 2023-02-14 NOTE — ED Notes (Signed)
Taken to dialysis by transporters.

## 2023-02-14 NOTE — ED Triage Notes (Signed)
Pt here to get dialysis. Pt denies any complaints at this time.

## 2023-02-14 NOTE — ED Provider Notes (Signed)
[name] Provider Note   CSN: [id number] Arrival date & time: 02/14/23  5102     History  Chief Complaint  Patient presents with   needs dialysis    Jeffrey [name] is a 62 y.o. male.  Pt is a 62 yo male with pmhx significant for ESRD on HD, DM, HTN, CHF, COPD, and PTSD.  Pt has been dismissed from his outpatient dialysis center and comes to the ED for dialysis.  Pt was last dialyzed on 3/16.  Pt is sob now.       Home Medications Prior to Admission medications   Medication Sig Start Date End Date Taking? Authorizing Provider  amLODipine (NORVASC) 10 MG tablet Take 10 mg by mouth every morning. 04/17/20   [provider]  ASPIRIN LOW DOSE 81 MG EC tablet Take 81 mg by mouth in the morning. 02/22/21   [provider]  atorvastatin (LIPITOR) 40 MG tablet Take 40 mg by mouth in the morning. 02/09/21   [provider]  calcium acetate (PHOSLO) 667 MG capsule Take 2 capsules (1,334 mg total) by mouth with breakfast, with lunch, and with evening meal. 04/10/21   Debbe Odea, MD  carvedilol (COREG) 12.5 MG tablet Take 12.5 mg by mouth 2 (two) times daily with a meal. 04/17/20   [provider]  Cholecalciferol (VITAMIN D) 50 MCG (2000 UT) CAPS Take 2,000 Units by mouth daily.    [provider]  clopidogrel (PLAVIX) 75 MG tablet Take 1 tablet (75 mg total) by mouth daily. 11/02/22   Rhyne, Hulen Shouts, PA-C  cyclobenzaprine (FLEXERIL) 10 MG tablet Take 10 mg by mouth 3 (three) times daily as needed for muscle spasms. 12/29/21   [provider]  diazepam (VALIUM) 5 MG tablet Take 5 mg by mouth 2 (two) times daily as needed for muscle spasms. 12/09/21   [provider]  diphenhydrAMINE (BENADRYL) 25 MG tablet Take 50 mg by mouth every 6 (six) hours as needed for itching.    [provider]  insulin regular human CONCENTRATED (HUMULIN R U-500 KWIKPEN) 500 UNIT/ML KwikPen Inject 45 Units  into the skin daily with supper. Patient taking differently: Inject 140 Units into the skin 2 (two) times daily with a meal. 09/04/21 08/30/24  [name], MD  metolazone (ZAROXOLYN) 10 MG tablet Take 10 mg by mouth daily. 07/13/21   [provider]  nortriptyline (PAMELOR) 10 MG capsule Take 20 mg by mouth at bedtime. 10/23/19 08/30/24  [provider]  oxyCODONE (ROXICODONE) 5 MG immediate release tablet Take 1 tablet (5 mg total) by mouth every 8 (eight) hours as needed. 12/31/22 12/31/23  [place] Jeffrey Poag, DO  oxymetazoline (AFRIN) 0.05 % nasal spray Place 1 spray into both nostrils daily as needed (nose bleeds).    [provider]  pantoprazole (PROTONIX) 40 MG tablet Take 40 mg by mouth daily before breakfast. 02/09/21   [provider]  pregabalin (LYRICA) 75 MG capsule Take 1 capsule (75 mg total) by mouth See admin instructions. Daily.  Give after dialysis on dialysis days. 01/21/23   [name], MD  sertraline (ZOLOFT) 100 MG tablet Take 100 mg by mouth in the morning. 02/22/21   [provider]  sodium chloride (OCEAN) 0.65 % SOLN nasal spray Place 2 sprays into both nostrils in the morning and at bedtime.    [provider]  tadalafil (CIALIS) 20 MG tablet Take 20 mg by mouth daily as needed  for erectile dysfunction.    [provider]  tamsulosin (FLOMAX) 0.4 MG CAPS capsule Take 1 capsule (0.4 mg total) by mouth daily. 11/10/21   Darliss Cheney, MD  torsemide (DEMADEX) 20 MG tablet Take 120 mg by mouth 2 (two) times daily.    [provider]  traZODone (DESYREL) 150 MG tablet Take 150 mg by mouth at bedtime. 12/09/21   [provider]      Allergies    Actos [pioglitazone], Dexmedetomidine, Ibuprofen, Tomato, and Wellbutrin [bupropion]    Review of Systems   Review of Systems  Respiratory:  Positive for shortness of breath.   All other systems reviewed and are negative.   Physical Exam Updated Vital  Signs BP (!) 148/54   Pulse 79   Temp 97.9 F (36.6 C) (Oral)   Resp 15   Ht 5\' 5"  (1.651 m)   Wt 129 kg   SpO2 100%   BMI 47.33 kg/m  Physical Exam Vitals and nursing note reviewed.  Constitutional:      Appearance: Normal appearance. He is obese.  HENT:     Head: Normocephalic and atraumatic.     Right Ear: External ear normal.     Left Ear: External ear normal.     Nose: Nose normal.     Mouth/Throat:     Mouth: Mucous membranes are dry.  Eyes:     Extraocular Movements: Extraocular movements intact.     Conjunctiva/sclera: Conjunctivae normal.     Pupils: Pupils are equal, round, and reactive to light.  Cardiovascular:     Rate and Rhythm: Normal rate and regular rhythm.     Pulses: Normal pulses.     Heart sounds: Normal heart sounds.  Pulmonary:     Breath sounds: Rhonchi present.  Abdominal:     General: Abdomen is flat. Bowel sounds are normal.     Palpations: Abdomen is soft.  Musculoskeletal:     Cervical back: Normal range of motion and neck supple.     Comments: +RUE AVF with good thrill L AKA  Skin:    General: Skin is warm.     Capillary Refill: Capillary refill takes less than 2 seconds.  Neurological:     General: No focal deficit present.     Mental Status: He is alert and oriented to person, place, and time.  Psychiatric:        Mood and Affect: Mood normal.     ED Results / Procedures / Treatments   Labs (all labs ordered are listed, but only abnormal results are displayed) Labs Reviewed  RENAL FUNCTION PANEL - Abnormal; Notable for the following components:      Result Value   Sodium 133 (*)    Chloride 94 (*)    Glucose, Bld 275 (*)    BUN 77 (*)    Creatinine, Ser 5.10 (*)    Calcium 8.4 (*)    GFR, Estimated 12 (*)    All other components within normal limits  CBC - Abnormal; Notable for the following components:   RBC 3.03 (*)    Hemoglobin 8.7 (*)    HCT 27.2 (*)    Platelets 119 (*)    All other components within normal  limits  HEPATITIS B SURFACE ANTIGEN  HEPATITIS B SURFACE ANTIBODY, QUANTITATIVE    EKG None  Radiology No results found.  Procedures Procedures    Medications Ordered in ED Medications  Chlorhexidine Gluconate Cloth 2 % PADS 6 each (has no administration  in time range)  pentafluoroprop-tetrafluoroeth (GEBAUERS) aerosol 1 Application (has no administration in time range)  lidocaine (PF) (XYLOCAINE) 1 % injection 5 mL (has no administration in time range)  lidocaine-prilocaine (EMLA) cream 1 Application (has no administration in time range)  heparin injection 1,000 Units (has no administration in time range)  anticoagulant sodium citrate solution 5 mL (has no administration in time range)  alteplase (CATHFLO ACTIVASE) injection 2 mg (has no administration in time range)  heparin injection 2,000 Units (2,000 Units Dialysis Given 02/14/23 [id number])    ED Course/ Medical Decision Making/ A&P                             Medical Decision Making  Pt here for dialysis.  I consulted Dr. Jonnie Finner who will take pt to the dialysis unit.  Plan for dialysis, then reassess in the ED after dialysis.        Final Clinical Impression(s) / ED Diagnoses Final diagnoses:  ESRD on hemodialysis [facility])    Rx / DC Orders ED Discharge Orders     None         Isla Pence, MD 02/14/23 [phone]

## 2023-02-14 NOTE — ED Provider Notes (Signed)
Patient is back from dialysis at this time.  He reports he is feeling good and ready to go home.  He has no complaints.   Blanchie Dessert, MD 02/14/23 1259

## 2023-02-16 ENCOUNTER — Encounter (HOSPITAL_COMMUNITY): Payer: Self-pay

## 2023-02-16 ENCOUNTER — Emergency Department (HOSPITAL_COMMUNITY)
Admission: EM | Admit: 2023-02-16 | Discharge: 2023-02-16 | Disposition: A | Discharge status: Home / Self Care | Payer: 59 | Attending: Emergency Medicine | Admitting: Emergency Medicine

## 2023-02-16 DIAGNOSIS — N186 End stage renal disease: Secondary | ICD-10-CM | POA: Insufficient documentation

## 2023-02-16 DIAGNOSIS — Z7982 Long term (current) use of aspirin: Secondary | ICD-10-CM | POA: Insufficient documentation

## 2023-02-16 DIAGNOSIS — E1122 Type 2 diabetes mellitus with diabetic chronic kidney disease: Secondary | ICD-10-CM | POA: Diagnosis not present

## 2023-02-16 DIAGNOSIS — Z79899 Other long term (current) drug therapy: Secondary | ICD-10-CM | POA: Diagnosis not present

## 2023-02-16 DIAGNOSIS — Z794 Long term (current) use of insulin: Secondary | ICD-10-CM | POA: Insufficient documentation

## 2023-02-16 DIAGNOSIS — I509 Heart failure, unspecified: Secondary | ICD-10-CM | POA: Diagnosis not present

## 2023-02-16 DIAGNOSIS — I132 Hypertensive heart and chronic kidney disease with heart failure and with stage 5 chronic kidney disease, or end stage renal disease: Secondary | ICD-10-CM | POA: Diagnosis not present

## 2023-02-16 DIAGNOSIS — J449 Chronic obstructive pulmonary disease, unspecified: Secondary | ICD-10-CM | POA: Diagnosis not present

## 2023-02-16 DIAGNOSIS — Z992 Dependence on renal dialysis: Secondary | ICD-10-CM | POA: Insufficient documentation

## 2023-02-16 LAB — BASIC METABOLIC PANEL
Anion gap: 12 (ref 5–15)
BUN: 68 mg/dL — ABNORMAL HIGH (ref 8–23)
CO2: 28 mmol/L (ref 22–32)
Calcium: 8.1 mg/dL — ABNORMAL LOW (ref 8.9–10.3)
Chloride: 91 mmol/L — ABNORMAL LOW (ref 98–111)
Creatinine, Ser: 5.13 mg/dL — ABNORMAL HIGH (ref 0.61–1.24)
GFR, Estimated: 12 mL/min — ABNORMAL LOW (ref >60)
Glucose, Bld: 303 mg/dL — ABNORMAL HIGH (ref 70–99)
Potassium: 3.5 mmol/L (ref 3.5–5.1)
Sodium: 131 mmol/L — ABNORMAL LOW (ref 135–145)

## 2023-02-16 LAB — CBC WITH DIFFERENTIAL/PLATELET
Abs Immature Granulocytes: 0.12 10*3/uL — ABNORMAL HIGH (ref 0.00–0.07)
Basophils Absolute: 0 10*3/uL (ref 0.0–0.1)
Basophils Relative: 0 %
Eosinophils Absolute: 0.1 10*3/uL (ref 0.0–0.5)
Eosinophils Relative: 2 %
HCT: 27.2 % — ABNORMAL LOW (ref 39.0–52.0)
Hemoglobin: 8.8 g/dL — ABNORMAL LOW (ref 13.0–17.0)
Immature Granulocytes: 2 %
Lymphocytes Relative: 9 %
Lymphs Abs: 0.7 10*3/uL (ref 0.7–4.0)
MCH: 29 pg (ref 26.0–34.0)
MCHC: 32.4 g/dL (ref 30.0–36.0)
MCV: 89.8 fL (ref 80.0–100.0)
Monocytes Absolute: 0.6 10*3/uL (ref 0.1–1.0)
Monocytes Relative: 7 %
Neutro Abs: 6.4 10*3/uL (ref 1.7–7.7)
Neutrophils Relative %: 80 %
Platelets: 122 10*3/uL — ABNORMAL LOW (ref 150–400)
RBC: 3.03 MIL/uL — ABNORMAL LOW (ref 4.22–5.81)
RDW: 14.6 % (ref 11.5–15.5)
WBC: 8 10*3/uL (ref 4.0–10.5)
nRBC: 0 % (ref 0.0–0.2)

## 2023-02-16 LAB — HEPATITIS B SURFACE ANTIBODY, QUANTITATIVE: Hep B S AB Quant (Post): 26.4 m[IU]/mL (ref >9.9)

## 2023-02-16 MED ORDER — LIDOCAINE-PRILOCAINE 2.5-2.5 % EX CREA: 1.0000 | TOPICAL_CREAM | CUTANEOUS | Status: DC | PRN

## 2023-02-16 MED ORDER — CHLORHEXIDINE GLUCONATE CLOTH 2 % EX PADS: 6.0000 | MEDICATED_PAD | Freq: Every day | CUTANEOUS | Status: DC

## 2023-02-16 MED ORDER — HEPARIN SODIUM (PORCINE) 1000 UNIT/ML DIALYSIS
2500.0000 [IU] | INTRAMUSCULAR | Status: DC | PRN
  Administered 2023-02-16: 2500 [IU] via INTRAVENOUS_CENTRAL
  Filled 2023-02-16 (×2): qty 3

## 2023-02-16 MED ORDER — LIDOCAINE HCL (PF) 1 % IJ SOLN: 5.0000 mL | INTRAMUSCULAR | Status: DC | PRN

## 2023-02-16 MED ORDER — DARBEPOETIN ALFA 60 MCG/0.3ML IJ SOSY: 60.0000 ug | PREFILLED_SYRINGE | Freq: Once | INTRAMUSCULAR | Status: DC

## 2023-02-16 MED ORDER — PENTAFLUOROPROP-TETRAFLUOROETH EX AERO: 1.0000 | INHALATION_SPRAY | CUTANEOUS | Status: DC | PRN

## 2023-02-16 NOTE — ED Notes (Signed)
Pt A&Ox4 and VSS upon departure from ED. Pt refusing D/C papers.

## 2023-02-16 NOTE — ED Notes (Signed)
Received report from HD RN

## 2023-02-16 NOTE — ED Notes (Signed)
Messaged nephrology provider regarding pt having had the Hep B surface antigen blood work recently and if pt still needs that blood work again.

## 2023-02-16 NOTE — ED Provider Notes (Signed)
[facility]   CSN: [id number] Arrival date & time: 02/16/23  [id number]     History  Chief Complaint  Patient presents with   Vascular Access Problem    Jeffrey [name] is a 62 y.o. male.  With past medical history of COPD on home O2, CHF, diabetes, end-stage renal disease on hemodialysis Tuesday, Thursday, Saturday, hypertension who presents to the emergency department for dialysis.  Last received dialysis on Tuesday.  He had a full treatment.  Currently without complaints.  Here for Thursday dialysis session.  HPI     Home Medications Prior to Admission medications   Medication Sig Start Date End Date Taking? Authorizing Provider  amLODipine (NORVASC) 10 MG tablet Take 10 mg by mouth every morning. 04/17/20   [provider]  ASPIRIN LOW DOSE 81 MG EC tablet Take 81 mg by mouth in the morning. 02/22/21   [provider]  atorvastatin (LIPITOR) 40 MG tablet Take 40 mg by mouth in the morning. 02/09/21   [provider]  calcium acetate (PHOSLO) 667 MG capsule Take 2 capsules (1,334 mg total) by mouth with breakfast, with lunch, and with evening meal. 04/10/21   Debbe Odea, MD  carvedilol (COREG) 12.5 MG tablet Take 12.5 mg by mouth 2 (two) times daily with a meal. 04/17/20   [provider]  Cholecalciferol (VITAMIN D) 50 MCG (2000 UT) CAPS Take 2,000 Units by mouth daily.    [provider]  clopidogrel (PLAVIX) 75 MG tablet Take 1 tablet (75 mg total) by mouth daily. 11/02/22   Rhyne, Hulen Shouts, PA-C  cyclobenzaprine (FLEXERIL) 10 MG tablet Take 10 mg by mouth 3 (three) times daily as needed for muscle spasms. 12/29/21   [provider]  diazepam (VALIUM) 5 MG tablet Take 5 mg by mouth 2 (two) times daily as needed for muscle spasms. 12/09/21   [provider]  diphenhydrAMINE (BENADRYL) 25 MG tablet Take 50 mg by mouth every 6 (six) hours as needed for itching.     [provider]  insulin regular human CONCENTRATED (HUMULIN R U-500 KWIKPEN) 500 UNIT/ML KwikPen Inject 45 Units into the skin daily with supper. Patient taking differently: Inject 140 Units into the skin 2 (two) times daily with a meal. 09/04/21 08/30/24  [name], MD  metolazone (ZAROXOLYN) 10 MG tablet Take 10 mg by mouth daily. 07/13/21   [provider]  nortriptyline (PAMELOR) 10 MG capsule Take 20 mg by mouth at bedtime. 10/23/19 08/30/24  [provider]  oxyCODONE (ROXICODONE) 5 MG immediate release tablet Take 1 tablet (5 mg total) by mouth every 8 (eight) hours as needed. 12/31/22 12/31/23  [place] [name], DO  oxymetazoline (AFRIN) 0.05 % nasal spray Place 1 spray into both nostrils daily as needed (nose bleeds).    [provider]  pantoprazole (PROTONIX) 40 MG tablet Take 40 mg by mouth daily before breakfast. 02/09/21   [provider]  pregabalin (LYRICA) 75 MG capsule Take 1 capsule (75 mg total) by mouth See admin instructions. Daily.  Give after dialysis on dialysis days. 01/21/23   [name], MD  sertraline (ZOLOFT) 100 MG tablet Take 100 mg by mouth in the morning. 02/22/21   [provider]  sodium chloride (OCEAN) 0.65 % SOLN nasal spray Place 2 sprays into both nostrils in the morning and at bedtime.    [provider]  tadalafil (CIALIS) 20 MG tablet Take 20 mg by  mouth daily as needed for erectile dysfunction.    [provider]  tamsulosin (FLOMAX) 0.4 MG CAPS capsule Take 1 capsule (0.4 mg total) by mouth daily. 11/10/21   Darliss Cheney, MD  torsemide (DEMADEX) 20 MG tablet Take 120 mg by mouth 2 (two) times daily.    [provider]  traZODone (DESYREL) 150 MG tablet Take 150 mg by mouth at bedtime. 12/09/21   [provider]      Allergies    Actos [pioglitazone], Dexmedetomidine, Ibuprofen, Tomato, and Wellbutrin [bupropion]    Review of Systems   Review of Systems  All  other systems reviewed and are negative.   Physical Exam Updated Vital Signs BP (!) 110/52 (BP Location: Left Arm)   Pulse 82   Temp 98.2 F (36.8 C) (Oral)   Resp 12   Ht 5\' 5"  (1.651 m)   Wt 133 kg   SpO2 100%   BMI 48.79 kg/m  Physical Exam Vitals and nursing note reviewed.  Constitutional:      General: He is not in acute distress.    Appearance: Normal appearance.  HENT:     Head: Normocephalic and atraumatic.  Eyes:     General: No scleral icterus. Cardiovascular:     Rate and Rhythm: Normal rate.     Pulses: Normal pulses.     Heart sounds: No murmur heard.    Comments: AV fistula with thrill and bruit present Pulmonary:     Effort: Pulmonary effort is normal. No respiratory distress.  Skin:    General: Skin is warm and dry.     Findings: No rash.  Neurological:     General: No focal deficit present.     Mental Status: He is alert and oriented to person, place, and time. Mental status is at baseline.  Psychiatric:        Mood and Affect: Mood normal.        Behavior: Behavior normal.        Thought Content: Thought content normal.        Judgment: Judgment normal.     ED Results / Procedures / Treatments   Labs (all labs ordered are listed, but only abnormal results are displayed) Labs Reviewed  CBC WITH DIFFERENTIAL/PLATELET - Abnormal; Notable for the following components:      Result Value   RBC 3.03 (*)    Hemoglobin 8.8 (*)    HCT 27.2 (*)    Platelets 122 (*)    Abs Immature Granulocytes 0.12 (*)    All other components within normal limits  BASIC METABOLIC PANEL - Abnormal; Notable for the following components:   Sodium 131 (*)    Chloride 91 (*)    Glucose, Bld 303 (*)    BUN 68 (*)    Creatinine, Ser 5.13 (*)    Calcium 8.1 (*)    GFR, Estimated 12 (*)    All other components within normal limits    EKG None  Radiology No results found.  Procedures Procedures   Medications Ordered in ED Medications  Chlorhexidine Gluconate  Cloth 2 % PADS 6 each (has no administration in time range)  Darbepoetin Alfa (ARANESP) injection 60 mcg (has no administration in time range)    ED Course/ Medical Decision Making/ A&P Clinical Course as of 02/16/23 1325  Thu Feb 16, 2023  [place] Dr. Jonnie Finner, nephrology for dialysis [LA]    Clinical Course User Index [LA] Mickie Hillier, PA-C    Medical Decision  Making Amount and/or Complexity of Data Reviewed Labs: ordered.   53: Consulted with Dr. Soyla Murphy from nephrology regarding need for dialysis.  The patient was taken to dialysis. 1324: Patient arrived back from dialysis.  He is well-appearing, nonseptic and nontoxic appearing.  He is hemodynamically stable.  He states that he feels well with no acute complaints at this time.  He is ready to be discharged.  Will discharge with return precautions for any worsening shortness of breath, nausea abdominal pain.  States that he will be back on Saturday for dialysis Final Clinical Impression(s) / ED Diagnoses Final diagnoses:  ESRD on dialysis [facility])    Rx / DC Orders ED Discharge Orders     None         Mickie Hillier, PA-C 02/16/23 1325    Ezequiel Essex, MD 02/16/23 1704

## 2023-02-16 NOTE — ED Notes (Signed)
Pt transported to HD at this time.

## 2023-02-16 NOTE — ED Triage Notes (Signed)
Pt. Here for dialysis . Last treatment on Tuesday . Able to complete treatment.

## 2023-02-16 NOTE — Discharge Instructions (Addendum)
You were seen in the ER for dialysis.  This was performed.  Please return if you have worsening symptoms.

## 2023-02-16 NOTE — ED Notes (Signed)
Consent for dialysis obtained at 0815 and on stretcher w/ pt

## 2023-02-16 NOTE — Progress Notes (Addendum)
POST HD TX NOTE  Addendum to not below, pt did make UF goal as it was a range from 2-3L  02/16/23 1219  Vitals  Temp 98.2 F (36.8 C)  Temp Source Oral  BP (!) 110/52  MAP (mmHg) 65  BP Location Left Arm  BP Method Automatic  Patient Position (if appropriate) Lying  Pulse Rate 82  Pulse Rate Source Monitor  ECG Heart Rate 81  Resp 12  Oxygen Therapy  SpO2 100 %  O2 Device Nasal Cannula  O2 Flow Rate (L/min) 3 L/min  Pulse Oximetry Type Continuous  During Treatment Monitoring  Intra-Hemodialysis Comments (S)   (post HD tx VS check)  Post Treatment  Dialyzer Clearance Lightly streaked  Duration of HD Treatment -hour(s) 3 hour(s) (3h 59min)  Hemodialysis Intake (mL) 0 mL  Liters Processed 73.1  Fluid Removed (mL) 2800 mL  Tolerated HD Treatment (S)  Yes (until the end when he starting getting severe cramping in his back)  Post-Hemodialysis Comments (S)  tx completed w/o problem until right near the end of tx when he starting having severe cramping in his back and wanted to end the tx 12 min early. UF goal not met d/t that. blood rinsed back, VSS. Medication Admin: Heparin 2500 units bolus  Fistula / Graft Right Upper arm Arteriovenous vein graft  Placement Date: 10/31/22   Placed prior to admission: Yes  Orientation: Right  Access Location: Upper arm  Access Type: Arteriovenous vein graft  Site Condition No complications  Fistula / Graft Assessment Bruit;Thrill;Present  Drainage Description None

## 2023-02-18 ENCOUNTER — Encounter (HOSPITAL_COMMUNITY): Payer: Self-pay

## 2023-02-18 ENCOUNTER — Other Ambulatory Visit: Payer: Self-pay

## 2023-02-18 ENCOUNTER — Emergency Department (HOSPITAL_COMMUNITY)
Admission: EM | Admit: 2023-02-18 | Discharge: 2023-02-18 | Disposition: A | Discharge status: Home / Self Care | Payer: 59 | Attending: Emergency Medicine | Admitting: Emergency Medicine

## 2023-02-18 DIAGNOSIS — N186 End stage renal disease: Secondary | ICD-10-CM | POA: Insufficient documentation

## 2023-02-18 DIAGNOSIS — Z992 Dependence on renal dialysis: Secondary | ICD-10-CM | POA: Insufficient documentation

## 2023-02-18 DIAGNOSIS — Z79899 Other long term (current) drug therapy: Secondary | ICD-10-CM | POA: Insufficient documentation

## 2023-02-18 DIAGNOSIS — Z7902 Long term (current) use of antithrombotics/antiplatelets: Secondary | ICD-10-CM | POA: Diagnosis not present

## 2023-02-18 DIAGNOSIS — Z7982 Long term (current) use of aspirin: Secondary | ICD-10-CM | POA: Insufficient documentation

## 2023-02-18 LAB — CBC
HCT: 27.5 % — ABNORMAL LOW (ref 39.0–52.0)
Hemoglobin: 8.8 g/dL — ABNORMAL LOW (ref 13.0–17.0)
MCH: 28.9 pg (ref 26.0–34.0)
MCHC: 32 g/dL (ref 30.0–36.0)
MCV: 90.5 fL (ref 80.0–100.0)
Platelets: 123 10*3/uL — ABNORMAL LOW (ref 150–400)
RBC: 3.04 MIL/uL — ABNORMAL LOW (ref 4.22–5.81)
RDW: 14.4 % (ref 11.5–15.5)
WBC: 8 10*3/uL (ref 4.0–10.5)
nRBC: 0 % (ref 0.0–0.2)

## 2023-02-18 LAB — BASIC METABOLIC PANEL
Anion gap: 12 (ref 5–15)
BUN: 66 mg/dL — ABNORMAL HIGH (ref 8–23)
CO2: 30 mmol/L (ref 22–32)
Calcium: 8.7 mg/dL — ABNORMAL LOW (ref 8.9–10.3)
Chloride: 93 mmol/L — ABNORMAL LOW (ref 98–111)
Creatinine, Ser: 5.16 mg/dL — ABNORMAL HIGH (ref 0.61–1.24)
GFR, Estimated: 12 mL/min — ABNORMAL LOW (ref >60)
Glucose, Bld: 138 mg/dL — ABNORMAL HIGH (ref 70–99)
Potassium: 3.2 mmol/L — ABNORMAL LOW (ref 3.5–5.1)
Sodium: 135 mmol/L (ref 135–145)

## 2023-02-18 MED ORDER — CHLORHEXIDINE GLUCONATE CLOTH 2 % EX PADS: 6.0000 | MEDICATED_PAD | Freq: Every day | CUTANEOUS | Status: DC

## 2023-02-18 MED ORDER — HEPARIN SODIUM (PORCINE) 1000 UNIT/ML DIALYSIS
2000.0000 [IU] | Freq: Once | INTRAMUSCULAR | Status: AC
  Administered 2023-02-18: 2000 [IU] via INTRAVENOUS_CENTRAL

## 2023-02-18 NOTE — ED Notes (Signed)
Consent for treatment obtained at this time.

## 2023-02-18 NOTE — Discharge Instructions (Signed)
Come back for shortness of breath or any new or worsening symptoms

## 2023-02-18 NOTE — ED Notes (Signed)
Pt arrives to hall 16 from dialysis. Alert and oriented sitting in personal wheelchair

## 2023-02-18 NOTE — ED Provider Notes (Signed)
[facility] Provider Note   CSN: [id number] Arrival date & time: 02/18/23  [id number]     History  Chief Complaint  Patient presents with   Vascular Access Problem    Needs Dialysis    Jeffrey [name] is a 62 y.o. male who presents emergency department with a chief complaint of needing dialysis.  Patient is unable to dialyze at the outpatient facilities.  He goes to dialysis Tuesday Thursday and Saturday.  He denies any shortness of breath.  HPI     Home Medications Prior to Admission medications   Medication Sig Start Date End Date Taking? Authorizing Provider  amLODipine (NORVASC) 10 MG tablet Take 10 mg by mouth every morning. 04/17/20   [provider]  ASPIRIN LOW DOSE 81 MG EC tablet Take 81 mg by mouth in the morning. 02/22/21   [provider]  atorvastatin (LIPITOR) 40 MG tablet Take 40 mg by mouth in the morning. 02/09/21   [provider]  calcium acetate (PHOSLO) 667 MG capsule Take 2 capsules (1,334 mg total) by mouth with breakfast, with lunch, and with evening meal. 04/10/21   Debbe Odea, MD  carvedilol (COREG) 12.5 MG tablet Take 12.5 mg by mouth 2 (two) times daily with a meal. 04/17/20   [provider]  Cholecalciferol (VITAMIN D) 50 MCG (2000 UT) CAPS Take 2,000 Units by mouth daily.    [provider]  clopidogrel (PLAVIX) 75 MG tablet Take 1 tablet (75 mg total) by mouth daily. 11/02/22   Rhyne, Hulen Shouts, PA-C  cyclobenzaprine (FLEXERIL) 10 MG tablet Take 10 mg by mouth 3 (three) times daily as needed for muscle spasms. 12/29/21   [provider]  diazepam (VALIUM) 5 MG tablet Take 5 mg by mouth 2 (two) times daily as needed for muscle spasms. 12/09/21   [provider]  diphenhydrAMINE (BENADRYL) 25 MG tablet Take 50 mg by mouth every 6 (six) hours as needed for itching.    [provider]  insulin regular human CONCENTRATED (HUMULIN R U-500 KWIKPEN) 500  UNIT/ML KwikPen Inject 45 Units into the skin daily with supper. Patient taking differently: Inject 140 Units into the skin 2 (two) times daily with a meal. 09/04/21 08/30/24  [name], MD  metolazone (ZAROXOLYN) 10 MG tablet Take 10 mg by mouth daily. 07/13/21   [provider]  nortriptyline (PAMELOR) 10 MG capsule Take 20 mg by mouth at bedtime. 10/23/19 08/30/24  [provider]  oxyCODONE (ROXICODONE) 5 MG immediate release tablet Take 1 tablet (5 mg total) by mouth every 8 (eight) hours as needed. 12/31/22 12/31/23  [place] [name], DO  oxymetazoline (AFRIN) 0.05 % nasal spray Place 1 spray into both nostrils daily as needed (nose bleeds).    [provider]  pantoprazole (PROTONIX) 40 MG tablet Take 40 mg by mouth daily before breakfast. 02/09/21   [provider]  pregabalin (LYRICA) 75 MG capsule Take 1 capsule (75 mg total) by mouth See admin instructions. Daily.  Give after dialysis on dialysis days. 01/21/23   [name], MD  sertraline (ZOLOFT) 100 MG tablet Take 100 mg by mouth in the morning. 02/22/21   [provider]  sodium chloride (OCEAN) 0.65 % SOLN nasal spray Place 2 sprays into both nostrils in the morning and at bedtime.    [provider]  tadalafil (CIALIS) 20 MG tablet Take 20 mg by mouth daily as needed for erectile dysfunction.  [provider]  tamsulosin (FLOMAX) 0.4 MG CAPS capsule Take 1 capsule (0.4 mg total) by mouth daily. 11/10/21   Darliss Cheney, MD  torsemide (DEMADEX) 20 MG tablet Take 120 mg by mouth 2 (two) times daily.    [provider]  traZODone (DESYREL) 150 MG tablet Take 150 mg by mouth at bedtime. 12/09/21   [provider]      Allergies    Actos [pioglitazone], Dexmedetomidine, Ibuprofen, Tomato, and Wellbutrin [bupropion]    Review of Systems   Review of Systems  Physical Exam Updated Vital Signs BP (!) 149/88   Pulse 84   Temp 97.9 F (36.6 C) (Oral)    Resp 16   SpO2 100%  Physical Exam Vitals and nursing note reviewed.  Constitutional:      General: He is not in acute distress.    Appearance: He is well-developed. He is not diaphoretic.  HENT:     Head: Normocephalic and atraumatic.  Eyes:     General: No scleral icterus.    Conjunctiva/sclera: Conjunctivae normal.  Cardiovascular:     Rate and Rhythm: Normal rate and regular rhythm.     Heart sounds: Normal heart sounds.  Pulmonary:     Effort: Pulmonary effort is normal. No respiratory distress.     Breath sounds: Normal breath sounds.  Abdominal:     Palpations: Abdomen is soft.     Tenderness: There is no abdominal tenderness.  Musculoskeletal:     Cervical back: Normal range of motion and neck supple.     Comments: S/p L aka   Skin:    General: Skin is warm and dry.  Neurological:     Mental Status: He is alert.  Psychiatric:        Behavior: Behavior normal.     ED Results / Procedures / Treatments   Labs (all labs ordered are listed, but only abnormal results are displayed) Labs Reviewed  BASIC METABOLIC PANEL - Abnormal; Notable for the following components:      Result Value   Potassium 3.2 (*)    Chloride 93 (*)    Glucose, Bld 138 (*)    BUN 66 (*)    Creatinine, Ser 5.16 (*)    Calcium 8.7 (*)    GFR, Estimated 12 (*)    All other components within normal limits  CBC - Abnormal; Notable for the following components:   RBC 3.04 (*)    Hemoglobin 8.8 (*)    HCT 27.5 (*)    Platelets 123 (*)    All other components within normal limits    EKG None  Radiology No results found.  Procedures Procedures    Medications Ordered in ED Medications  Chlorhexidine Gluconate Cloth 2 % PADS 6 each (has no administration in time range)  heparin injection 2,000 Units (2,000 Units Dialysis Given 02/18/23 [id number])    ED Course/ Medical Decision Making/ A&P Clinical Course as of 02/18/23 1511  Sat Feb 18, 2023  0830 I spoke with Dr. Jonnie Finner. Orders for  dialysis are in place. Will presumably be discharged after completion. [AH]    Clinical Course User Index [AH] Margarita Mail, PA-C                             Medical Decision Making Patient's labs reviewed, baseline, no significant elevation in potassium.  Case discussed with Dr. Jonnie Finner who is ordered dialysis.   Patient returned from dialysis well-appearing  will discharge at this time.  Precautions given in discharge paperwork  Amount and/or Complexity of Data Reviewed Labs: ordered.           Final Clinical Impression(s) / ED Diagnoses Final diagnoses:  ESRD (end stage renal disease) [facility])    Rx / [place] Orders ED Discharge Orders     None         Margarita Mail, PA-C 02/18/23 1511    Malvin Johns, MD 02/18/23 [phone]

## 2023-02-18 NOTE — Procedures (Signed)
We are asked to see this patient for hospital dialysis. Pt was discharged from his OP HD unit in December 2023 due to behavioral issues. Pt is here in ED requesting hospital HD. Plan is for HD upstairs then return to ED for reassessment and expected discharge home.    Last hep B labs here were on 02/14/23     Last OP HD orders from dec 2023:  3:15h  129kg  RUE AVG   Heparin 2000   Kelly Splinter, MD 02/18/2023, 8:26 AM  Recent Labs  Lab 02/14/23 0813 02/16/23 0730  HGB 8.7* 8.8*  ALBUMIN 3.5  --   CALCIUM 8.4* 8.1*  PHOS 4.5  --   CREATININE 5.10* 5.13*  K 3.5 3.5

## 2023-02-18 NOTE — Progress Notes (Signed)
   02/18/23 1238  During Treatment Monitoring  Ultrafiltration Rate (mL/min) 0 mL/min  Intra-Hemodialysis Comments  (UF off due to pt cramping goal decreased to 2.5L)  Dialysis Fluid Bolus Normal Saline  Bolus Amount (mL) 300 mL

## 2023-02-18 NOTE — ED Triage Notes (Signed)
Pt here for dialysis, had dialysis Thursday, no s/s of pain or distress, AOX4.

## 2023-02-18 NOTE — Progress Notes (Signed)
Received patient in bed to unit.  Alert and oriented.  Informed consent signed and in chart.   TX duration:2:47  Patient tolerated well.  Transported back to the room  Alert, without acute distress.  Hand-off given to patient's nurse.   Access used: left AVG Access issues: NONE  Total UF removed: 2.1L Medication(s) given: NONE    02/18/23 1251  Vitals  BP (!) 162/70  MAP (mmHg) 95  BP Location Left Wrist  BP Method Automatic  Patient Position (if appropriate) Lying  Pulse Rate 80  Pulse Rate Source Monitor  ECG Heart Rate 85  Resp 17  Oxygen Therapy  SpO2 100 %  O2 Device Nasal Cannula  O2 Flow Rate (L/min) 3 L/min  Post Treatment  Dialyzer Clearance Lightly streaked  Duration of HD Treatment -hour(s)  (2:47)  Liters Processed 66.9  Fluid Removed (mL) 2100 mL  Tolerated HD Treatment No (Comment) (cramping)  AVG/AVF Arterial Site Held (minutes) 7 minutes  AVG/AVF Venous Site Held (minutes) 7 minutes  Fistula / Graft Right Upper arm Arteriovenous vein graft  Placement Date: 10/31/22   Placed prior to admission: Yes  Orientation: Right  Access Location: Upper arm  Access Type: Arteriovenous vein graft  Site Condition No complications  Fistula / Graft Assessment Present;Thrill;Bruit  Status Patent  Drainage Description None      Mitsue Peery [facility]

## 2023-02-21 ENCOUNTER — Encounter (HOSPITAL_COMMUNITY): Payer: Self-pay | Admitting: Emergency Medicine

## 2023-02-21 ENCOUNTER — Other Ambulatory Visit: Payer: Self-pay

## 2023-02-21 ENCOUNTER — Emergency Department (HOSPITAL_COMMUNITY)
Admission: EM | Admit: 2023-02-21 | Discharge: 2023-02-21 | Disposition: A | Discharge status: Home / Self Care | Payer: 59 | Attending: Emergency Medicine | Admitting: Emergency Medicine

## 2023-02-21 DIAGNOSIS — E119 Type 2 diabetes mellitus without complications: Secondary | ICD-10-CM | POA: Insufficient documentation

## 2023-02-21 DIAGNOSIS — I509 Heart failure, unspecified: Secondary | ICD-10-CM | POA: Diagnosis not present

## 2023-02-21 DIAGNOSIS — Z79899 Other long term (current) drug therapy: Secondary | ICD-10-CM | POA: Diagnosis not present

## 2023-02-21 DIAGNOSIS — M7989 Other specified soft tissue disorders: Secondary | ICD-10-CM | POA: Diagnosis not present

## 2023-02-21 DIAGNOSIS — J449 Chronic obstructive pulmonary disease, unspecified: Secondary | ICD-10-CM | POA: Diagnosis not present

## 2023-02-21 DIAGNOSIS — Z7982 Long term (current) use of aspirin: Secondary | ICD-10-CM | POA: Insufficient documentation

## 2023-02-21 DIAGNOSIS — N186 End stage renal disease: Secondary | ICD-10-CM | POA: Diagnosis not present

## 2023-02-21 DIAGNOSIS — Z992 Dependence on renal dialysis: Secondary | ICD-10-CM | POA: Insufficient documentation

## 2023-02-21 DIAGNOSIS — Z7902 Long term (current) use of antithrombotics/antiplatelets: Secondary | ICD-10-CM | POA: Diagnosis not present

## 2023-02-21 LAB — RENAL FUNCTION PANEL
Albumin: 3.3 g/dL — ABNORMAL LOW (ref 3.5–5.0)
Anion gap: 12 (ref 5–15)
BUN: 68 mg/dL — ABNORMAL HIGH (ref 8–23)
CO2: 28 mmol/L (ref 22–32)
Calcium: 8.6 mg/dL — ABNORMAL LOW (ref 8.9–10.3)
Chloride: 92 mmol/L — ABNORMAL LOW (ref 98–111)
Creatinine, Ser: 5.41 mg/dL — ABNORMAL HIGH (ref 0.61–1.24)
GFR, Estimated: 11 mL/min — ABNORMAL LOW (ref >60)
Glucose, Bld: 405 mg/dL — ABNORMAL HIGH (ref 70–99)
Phosphorus: 3.9 mg/dL (ref 2.5–4.6)
Potassium: 3.4 mmol/L — ABNORMAL LOW (ref 3.5–5.1)
Sodium: 132 mmol/L — ABNORMAL LOW (ref 135–145)

## 2023-02-21 LAB — CBC
HCT: 26.5 % — ABNORMAL LOW (ref 39.0–52.0)
Hemoglobin: 8.5 g/dL — ABNORMAL LOW (ref 13.0–17.0)
MCH: 29 pg (ref 26.0–34.0)
MCHC: 32.1 g/dL (ref 30.0–36.0)
MCV: 90.4 fL (ref 80.0–100.0)
Platelets: 112 10*3/uL — ABNORMAL LOW (ref 150–400)
RBC: 2.93 MIL/uL — ABNORMAL LOW (ref 4.22–5.81)
RDW: 14.5 % (ref 11.5–15.5)
WBC: 7.3 10*3/uL (ref 4.0–10.5)
nRBC: 0 % (ref 0.0–0.2)

## 2023-02-21 MED ORDER — HEPARIN SODIUM (PORCINE) 1000 UNIT/ML DIALYSIS
2000.0000 [IU] | Freq: Once | INTRAMUSCULAR | Status: AC
  Administered 2023-02-21: 2000 [IU] via INTRAVENOUS_CENTRAL
  Filled 2023-02-21: qty 2

## 2023-02-21 MED ORDER — CHLORHEXIDINE GLUCONATE CLOTH 2 % EX PADS: 6.0000 | MEDICATED_PAD | Freq: Every day | CUTANEOUS | Status: DC

## 2023-02-21 NOTE — ED Provider Notes (Signed)
Patient management alongside with PA [facility].  This is a 62 year old male with significant history of end-stage renal disease who is currently on Tuesday Thursday Saturday dialysis.  He is here requesting to be dialyzed.  He has been previously fired from his outpatient dialysis center in December 2023.  He comes to the ER as scheduled for dialysis.  His last dialysis session was last Saturday.  He is currently without any complaint.  Appreciate consultation from on-call nephrologist, Dr. Osborne Casco who agrees to have patient dialyze and will order appropriate labs as necessary.  Patient likely will return back to the ER for reevaluation and disposition.   Domenic Moras, PA-C 02/21/23 [place], [place], DO 02/21/23 1241

## 2023-02-21 NOTE — Procedures (Signed)
I was present at this dialysis session. I have reviewed the session itself and made appropriate changes.   Routine HD. RUE swelling. Plans for intervention w/ Dr. Donzetta Matters in early April.  Filed Weights   02/21/23 0924  Weight: 131 kg    Recent Labs  Lab 02/18/23 0835  NA 135  K 3.2*  CL 93*  CO2 30  GLUCOSE 138*  BUN 66*  CREATININE 5.16*  CALCIUM 8.7*    Recent Labs  Lab 02/16/23 0730 02/18/23 0835 02/21/23 0841  WBC 8.0 8.0 7.3  NEUTROABS 6.4  --   --   HGB 8.8* 8.8* 8.5*  HCT 27.2* 27.5* 26.5*  MCV 89.8 90.5 90.4  PLT 122* 123* 112*    Scheduled Meds:  Chlorhexidine Gluconate Cloth  6 each Topical Q0600   Continuous Infusions: PRN Meds:.   Santiago Bumpers,  MD 02/21/2023, 10:43 AM

## 2023-02-21 NOTE — ED Triage Notes (Signed)
Pt states he is here for dialysis- TTS. Denies any problems at this time.

## 2023-02-21 NOTE — ED Provider Notes (Signed)
[place] EMERGENCY DEPARTMENT AT [facility] Provider Note   CSN: [id number] Arrival date & time: 02/21/23  0710     History  Chief Complaint  Patient presents with   Vascular Access Problem    Needs Dialysis    Jeffrey [name] is a 62 y.o. male with past medical history of COPD on home O2, CHF, diabetes, end-stage renal disease who presents to the ED with chief complaint of needing dialysis. Patient has been coming to ED for dialysis on Tuesdays, Thursdays, and Saturdays. Patient claims that he was dismissed from his last dialysis center and that he has been having troubles finding another center that would take his insurance. Last dialysis on 02/18/2023. Patient denies dizziness, nausea, vomiting, abdominal pain, arrhythmias. Patient endorses swelling of right arm that is associated with his AV site. Denies fever and chills. He states that he has an appointment with a vascular surgeon to address right arm swelling on 03/03/2023.  The history is provided by the patient.       Home Medications Prior to Admission medications   Medication Sig Start Date End Date Taking? Authorizing Provider  amLODipine (NORVASC) 10 MG tablet Take 10 mg by mouth every morning. 04/17/20   [provider]  ASPIRIN LOW DOSE 81 MG EC tablet Take 81 mg by mouth in the morning. 02/22/21   [provider]  atorvastatin (LIPITOR) 40 MG tablet Take 40 mg by mouth in the morning. 02/09/21   [provider]  calcium acetate (PHOSLO) 667 MG capsule Take 2 capsules (1,334 mg total) by mouth with breakfast, with lunch, and with evening meal. 04/10/21   Calvert Cantor, MD  carvedilol (COREG) 12.5 MG tablet Take 12.5 mg by mouth 2 (two) times daily with a meal. 04/17/20   [provider]  Cholecalciferol (VITAMIN D) 50 MCG (2000 UT) CAPS Take 2,000 Units by mouth daily.    [provider]  clopidogrel (PLAVIX) 75 MG tablet Take 1 tablet (75 mg total) by mouth daily. 11/02/22    Rhyne, Ames Coupe, PA-C  cyclobenzaprine (FLEXERIL) 10 MG tablet Take 10 mg by mouth 3 (three) times daily as needed for muscle spasms. 12/29/21   [provider]  diazepam (VALIUM) 5 MG tablet Take 5 mg by mouth 2 (two) times daily as needed for muscle spasms. 12/09/21   [provider]  diphenhydrAMINE (BENADRYL) 25 MG tablet Take 50 mg by mouth every 6 (six) hours as needed for itching.    [provider]  insulin regular human CONCENTRATED (HUMULIN R U-500 KWIKPEN) 500 UNIT/ML KwikPen Inject 45 Units into the skin daily with supper. Patient taking differently: Inject 140 Units into the skin 2 (two) times daily with a meal. 09/04/21 08/30/24  [name], MD  metolazone (ZAROXOLYN) 10 MG tablet Take 10 mg by mouth daily. 07/13/21   [provider]  nortriptyline (PAMELOR) 10 MG capsule Take 20 mg by mouth at bedtime. 10/23/19 08/30/24  [provider]  oxyCODONE (ROXICODONE) 5 MG immediate release tablet Take 1 tablet (5 mg total) by mouth every 8 (eight) hours as needed. 12/31/22 12/31/23  [name], [name], DO  oxymetazoline (AFRIN) 0.05 % nasal spray Place 1 spray into both nostrils daily as needed (nose bleeds).    [provider]  pantoprazole (PROTONIX) 40 MG tablet Take 40 mg by mouth daily before breakfast. 02/09/21   [provider]  pregabalin (LYRICA) 75 MG capsule Take 1 capsule (75 mg total) by mouth See  admin instructions. Daily.  Give after dialysis on dialysis days. 01/21/23   [name], MD  sertraline (ZOLOFT) 100 MG tablet Take 100 mg by mouth in the morning. 02/22/21   [provider]  sodium chloride (OCEAN) 0.65 % SOLN nasal spray Place 2 sprays into both nostrils in the morning and at bedtime.    [provider]  tadalafil (CIALIS) 20 MG tablet Take 20 mg by mouth daily as needed for erectile dysfunction.    [provider]  tamsulosin (FLOMAX) 0.4 MG CAPS capsule Take 1 capsule (0.4 mg total)  by mouth daily. 11/10/21   [name], MD  torsemide (DEMADEX) 20 MG tablet Take 120 mg by mouth 2 (two) times daily.    [provider]  traZODone (DESYREL) 150 MG tablet Take 150 mg by mouth at bedtime. 12/09/21   [provider]      Allergies    Actos [pioglitazone], Dexmedetomidine, Ibuprofen, Tomato, and Wellbutrin [bupropion]    Review of Systems   Review of Systems  Constitutional:  Negative for chills, diaphoresis and fever.  Respiratory:         DOE has not changed from baseline  Cardiovascular:  Negative for chest pain and palpitations.  Gastrointestinal:  Negative for abdominal pain, nausea and vomiting.  Neurological:  Negative for dizziness.    Physical Exam Updated Vital Signs BP (!) 105/56 (BP Location: Left Wrist)   Pulse 77   Temp 98 F (36.7 C)   Resp 14   Wt 128 kg   SpO2 100%   BMI 46.96 kg/m  Physical Exam Vitals reviewed.  Constitutional:      General: He is not in acute distress.    Appearance: He is obese. He is not toxic-appearing.  HENT:     Head: Normocephalic and atraumatic.  Eyes:     Extraocular Movements: Extraocular movements intact.  Cardiovascular:     Rate and Rhythm: Normal rate.     Heart sounds: Normal heart sounds.  Pulmonary:     Effort: Pulmonary effort is normal.     Breath sounds: No wheezing.     Comments: No respiratory distress on nasal cannula Abdominal:     Tenderness: There is no abdominal tenderness.  Musculoskeletal:        General: Swelling present.     Comments: Swelling of right forearm. No tenderness or erythema. s/p L aka  Skin:    General: Skin is warm and dry.  Neurological:     General: No focal deficit present.     Mental Status: He is alert and oriented to person, place, and time.  Psychiatric:        Mood and Affect: Mood normal.        Behavior: Behavior normal.     ED Results / Procedures / Treatments   Labs (all labs ordered are listed, but only abnormal results are  displayed) Labs Reviewed  RENAL FUNCTION PANEL - Abnormal; Notable for the following components:      Result Value   Sodium 132 (*)    Potassium 3.4 (*)    Chloride 92 (*)    Glucose, Bld 405 (*)    BUN 68 (*)    Creatinine, Ser 5.41 (*)    Calcium 8.6 (*)    Albumin 3.3 (*)    GFR, Estimated 11 (*)    All other components within normal limits  CBC - Abnormal; Notable for the following components:   RBC 2.93 (*)  Hemoglobin 8.5 (*)    HCT 26.5 (*)    Platelets 112 (*)    All other components within normal limits    EKG None  Radiology No results found.  Procedures Procedures    Medications Ordered in ED Medications  Chlorhexidine Gluconate Cloth 2 % PADS 6 each (has no administration in time range)  heparin injection 2,000 Units (2,000 Units Dialysis Given 02/21/23 0941)    ED Course/ Medical Decision Making/ A&P                             Medical Decision Making Appreciate consultation from on-call nephrologist, Dr. Valentino Nose who agrees to have patient dialyze and will order appropriate labs as necessary.  Patient to be discharged from ED following dailysis pending stable condition.  Patient returned from dialysis well-appearing will discharge at this time           Final Clinical Impression(s) / ED Diagnoses Final diagnoses:  ESRD (end stage renal disease) [facility])    Rx / DC Orders ED Discharge Orders     None         Margarita Rana 02/21/23 1558    Virgina Norfolk, DO 02/22/23 [phone]

## 2023-02-23 ENCOUNTER — Emergency Department (HOSPITAL_COMMUNITY)
Admission: EM | Admit: 2023-02-23 | Discharge: 2023-02-23 | Disposition: A | Discharge status: Home / Self Care | Payer: 59 | Attending: Emergency Medicine | Admitting: Emergency Medicine

## 2023-02-23 ENCOUNTER — Encounter (HOSPITAL_COMMUNITY): Payer: Self-pay | Admitting: Emergency Medicine

## 2023-02-23 ENCOUNTER — Other Ambulatory Visit: Payer: Self-pay

## 2023-02-23 DIAGNOSIS — N186 End stage renal disease: Secondary | ICD-10-CM | POA: Insufficient documentation

## 2023-02-23 DIAGNOSIS — I509 Heart failure, unspecified: Secondary | ICD-10-CM | POA: Diagnosis not present

## 2023-02-23 DIAGNOSIS — I132 Hypertensive heart and chronic kidney disease with heart failure and with stage 5 chronic kidney disease, or end stage renal disease: Secondary | ICD-10-CM | POA: Insufficient documentation

## 2023-02-23 DIAGNOSIS — Z992 Dependence on renal dialysis: Secondary | ICD-10-CM | POA: Insufficient documentation

## 2023-02-23 DIAGNOSIS — Z79899 Other long term (current) drug therapy: Secondary | ICD-10-CM | POA: Insufficient documentation

## 2023-02-23 DIAGNOSIS — E1122 Type 2 diabetes mellitus with diabetic chronic kidney disease: Secondary | ICD-10-CM | POA: Diagnosis not present

## 2023-02-23 DIAGNOSIS — Z7982 Long term (current) use of aspirin: Secondary | ICD-10-CM | POA: Diagnosis not present

## 2023-02-23 DIAGNOSIS — Z794 Long term (current) use of insulin: Secondary | ICD-10-CM | POA: Diagnosis not present

## 2023-02-23 DIAGNOSIS — Z7902 Long term (current) use of antithrombotics/antiplatelets: Secondary | ICD-10-CM | POA: Diagnosis not present

## 2023-02-23 LAB — RENAL FUNCTION PANEL
Albumin: 3.6 g/dL (ref 3.5–5.0)
Anion gap: 12 (ref 5–15)
BUN: 59 mg/dL — ABNORMAL HIGH (ref 8–23)
CO2: 27 mmol/L (ref 22–32)
Calcium: 8.9 mg/dL (ref 8.9–10.3)
Chloride: 96 mmol/L — ABNORMAL LOW (ref 98–111)
Creatinine, Ser: 5.15 mg/dL — ABNORMAL HIGH (ref 0.61–1.24)
GFR, Estimated: 12 mL/min — ABNORMAL LOW (ref >60)
Glucose, Bld: 156 mg/dL — ABNORMAL HIGH (ref 70–99)
Phosphorus: 3.1 mg/dL (ref 2.5–4.6)
Potassium: 3.4 mmol/L — ABNORMAL LOW (ref 3.5–5.1)
Sodium: 135 mmol/L (ref 135–145)

## 2023-02-23 LAB — CBC
HCT: 28.5 % — ABNORMAL LOW (ref 39.0–52.0)
Hemoglobin: 9.2 g/dL — ABNORMAL LOW (ref 13.0–17.0)
MCH: 28.8 pg (ref 26.0–34.0)
MCHC: 32.3 g/dL (ref 30.0–36.0)
MCV: 89.3 fL (ref 80.0–100.0)
Platelets: 136 10*3/uL — ABNORMAL LOW (ref 150–400)
RBC: 3.19 MIL/uL — ABNORMAL LOW (ref 4.22–5.81)
RDW: 14.4 % (ref 11.5–15.5)
WBC: 8.5 10*3/uL (ref 4.0–10.5)
nRBC: 0 % (ref 0.0–0.2)

## 2023-02-23 MED ORDER — HEPARIN SODIUM (PORCINE) 1000 UNIT/ML DIALYSIS
2000.0000 [IU] | Freq: Once | INTRAMUSCULAR | Status: AC
  Administered 2023-02-23: 2000 [IU] via INTRAVENOUS_CENTRAL
  Filled 2023-02-23: qty 2

## 2023-02-23 MED ORDER — CHLORHEXIDINE GLUCONATE CLOTH 2 % EX PADS: 6.0000 | MEDICATED_PAD | Freq: Every day | CUTANEOUS | Status: DC

## 2023-02-23 NOTE — ED Provider Notes (Addendum)
[facility] Provider Note   CSN: [id number] Arrival date & time: 02/23/23  0745     History  Chief Complaint  Patient presents with   Needs Dialysis    Jeffrey [name] is a 62 y.o. male.  HPI   Patient has a history of diabetes, hypertension, congestive heart failure, COPD, end-stage renal disease on hemodialysis.  Patient has been discharged from his outpatient dialysis center.  He has been coming to the ED in the interim in order to receive dialysis at the hospital.  Patient last went to dialysis on Tuesday.  He presents today to receive dialysis again.  Patient states he is feeling a little bit short of breath this morning.  He feels like he might have some fluid buildup.  Patient is normally on 3 L of oxygen at baseline.  He was not wearing it when he first arrived in the emergency department.  Patient was placed on it at triage he has not had any fevers or chills.  No significant leg swelling.  No chest pain.  Home Medications Prior to Admission medications   Medication Sig Start Date End Date Taking? Authorizing Provider  amLODipine (NORVASC) 10 MG tablet Take 10 mg by mouth every morning. 04/17/20   [provider]  ASPIRIN LOW DOSE 81 MG EC tablet Take 81 mg by mouth in the morning. 02/22/21   [provider]  atorvastatin (LIPITOR) 40 MG tablet Take 40 mg by mouth in the morning. 02/09/21   [provider]  calcium acetate (PHOSLO) 667 MG capsule Take 2 capsules (1,334 mg total) by mouth with breakfast, with lunch, and with evening meal. 04/10/21   Debbe Odea, MD  carvedilol (COREG) 12.5 MG tablet Take 12.5 mg by mouth 2 (two) times daily with a meal. 04/17/20   [provider]  Cholecalciferol (VITAMIN D) 50 MCG (2000 UT) CAPS Take 2,000 Units by mouth daily.    [provider]  clopidogrel (PLAVIX) 75 MG tablet Take 1 tablet (75 mg total) by mouth daily. 11/02/22   Rhyne, Hulen Shouts, PA-C   cyclobenzaprine (FLEXERIL) 10 MG tablet Take 10 mg by mouth 3 (three) times daily as needed for muscle spasms. 12/29/21   [provider]  diazepam (VALIUM) 5 MG tablet Take 5 mg by mouth 2 (two) times daily as needed for muscle spasms. 12/09/21   [provider]  diphenhydrAMINE (BENADRYL) 25 MG tablet Take 50 mg by mouth every 6 (six) hours as needed for itching.    [provider]  insulin regular human CONCENTRATED (HUMULIN R U-500 KWIKPEN) 500 UNIT/ML KwikPen Inject 45 Units into the skin daily with supper. Patient taking differently: Inject 140 Units into the skin 2 (two) times daily with a meal. 09/04/21 08/30/24  [name], MD  metolazone (ZAROXOLYN) 10 MG tablet Take 10 mg by mouth daily. 07/13/21   [provider]  nortriptyline (PAMELOR) 10 MG capsule Take 20 mg by mouth at bedtime. 10/23/19 08/30/24  [provider]  oxyCODONE (ROXICODONE) 5 MG immediate release tablet Take 1 tablet (5 mg total) by mouth every 8 (eight) hours as needed. 12/31/22 12/31/23  [place] [name], DO  oxymetazoline (AFRIN) 0.05 % nasal spray Place 1 spray into both nostrils daily as needed (nose bleeds).    [provider]  pantoprazole (PROTONIX) 40 MG tablet Take 40 mg by mouth daily before breakfast. 02/09/21   [provider]  pregabalin (LYRICA) 75 MG capsule  Take 1 capsule (75 mg total) by mouth See admin instructions. Daily.  Give after dialysis on dialysis days. 01/21/23   [name], MD  sertraline (ZOLOFT) 100 MG tablet Take 100 mg by mouth in the morning. 02/22/21   [provider]  sodium chloride (OCEAN) 0.65 % SOLN nasal spray Place 2 sprays into both nostrils in the morning and at bedtime.    [provider]  tadalafil (CIALIS) 20 MG tablet Take 20 mg by mouth daily as needed for erectile dysfunction.    [provider]  tamsulosin (FLOMAX) 0.4 MG CAPS capsule Take 1 capsule (0.4 mg total) by mouth daily. 11/10/21    Darliss Cheney, MD  torsemide (DEMADEX) 20 MG tablet Take 120 mg by mouth 2 (two) times daily.    [provider]  traZODone (DESYREL) 150 MG tablet Take 150 mg by mouth at bedtime. 12/09/21   [provider]      Allergies    Actos [pioglitazone], Dexmedetomidine, Ibuprofen, Tomato, and Wellbutrin [bupropion]    Review of Systems   Review of Systems  Physical Exam Updated Vital Signs BP (!) 158/60 (BP Location: Left Arm)   Pulse 90   Temp 98 F (36.7 C) (Oral)   Resp (!) 24   Ht 1.651 m (5\' 5" )   Wt 128 kg   SpO2 99%   BMI 46.96 kg/m  Physical Exam Vitals and nursing note reviewed.  Constitutional:      Appearance: He is well-developed. He is not diaphoretic.  HENT:     Head: Normocephalic and atraumatic.     Right Ear: External ear normal.     Left Ear: External ear normal.  Eyes:     General: No scleral icterus.       Right eye: No discharge.        Left eye: No discharge.     Conjunctiva/sclera: Conjunctivae normal.  Neck:     Trachea: No tracheal deviation.  Cardiovascular:     Rate and Rhythm: Normal rate.  Pulmonary:     Effort: Pulmonary effort is normal. No respiratory distress.     Breath sounds: Normal breath sounds. No stridor. No wheezing.     Comments: Patient is on his chronic home O2 Abdominal:     General: There is no distension.  Musculoskeletal:        General: No swelling or deformity.     Cervical back: Neck supple.  Skin:    General: Skin is warm and dry.     Findings: No rash.  Neurological:     Mental Status: He is alert. Mental status is at baseline.     Cranial Nerves: No dysarthria or facial asymmetry.     Motor: No seizure activity.     ED Results / Procedures / Treatments   Labs (all labs ordered are listed, but only abnormal results are displayed) Labs Reviewed - No data to display  EKG None  Radiology No results found.  Procedures Procedures    Medications Ordered in ED Medications   Chlorhexidine Gluconate Cloth 2 % PADS 6 each (has no administration in time range)    ED Course/ Medical Decision Making/ A&P Clinical Course as of 02/23/23 0850  Thu Feb 23, 2023  [id number] Case reviewed with Dr Joylene Grapes.  WIll plan on dialysis [JK]    Clinical Course User Index [JK] [name], MD  Medical Decision Making Problems Addressed: End stage renal disease [facility]): chronic illness or injury with exacerbation, progression, or side effects of treatment  Amount and/or Complexity of Data Reviewed Discussion of management or test interpretation with external provider(s): Case discussed with nephrology service   Patient presented to the ED for his routine dialysis.  Patient previously has been discharged from his outpatient dialysis center so is coming to the hospital in the interim.  No acute issues today.  Patient mentions some mild dyspnea but his lungs are clear he has normal oxygen saturation on his normal supplemental O2.  Anticipate discharge home today after his dialysis.        Final Clinical Impression(s) / ED Diagnoses Final diagnoses:  End stage renal disease [facility])    Rx / DC Orders ED Discharge Orders     None         [name], MD 02/23/23 [phone] I was asked by dialysis to confirm patient's full CODE STATUS.  Patient confirms he is full code   [name], MD 02/23/23 1106

## 2023-02-23 NOTE — ED Triage Notes (Signed)
Patient arrives needing his regularly scheduled dialysis. TTS. Last had dialysis on Tuesday and finished session with no issue. Patient only complains of shortness of breath a bit moreso than usual but wears 3 L of O2 at baseline. Wasn't wearing when initially came in but placed on 3 L during triage.

## 2023-02-23 NOTE — ED Provider Notes (Signed)
Patient was discharged by nursing since his ride had arrived.  I was not able to evaluate the patient prior to this and was not notified prior to his discharge   Fransico Meadow, MD 02/23/23 [phone]

## 2023-02-23 NOTE — Progress Notes (Signed)
[facility]  Patient presented to the ER for dialysis, does not have an outpatient dialysis unit due to threatening behavior at outpatient unit. Reported slightly worse SOB to ER staff. Will arrange for HD today, 3L UF goal as tolerated. Will get CBC and RFP with HD. Patient can return to ED after dialysis for reevaluation and hopefully discharge.   Anice Paganini, PA-C 02/23/2023, 8:12 AM  [facility] Pager: [phone].

## 2023-02-23 NOTE — Progress Notes (Signed)
POST HD TX NOTE  02/23/23 1555  Vitals  Temp 98.2 F (36.8 C)  Temp Source Oral  BP 99/65  MAP (mmHg) 77  BP Location Left Arm  BP Method Automatic  Patient Position (if appropriate) Lying  Pulse Rate 83  Pulse Rate Source Monitor  ECG Heart Rate 83  Resp 17  Oxygen Therapy  SpO2 100 %  O2 Device Nasal Cannula  O2 Flow Rate (L/min) 3 L/min  Pulse Oximetry Type Continuous  During Treatment Monitoring  Intra-Hemodialysis Comments (S)   (post HD tx VS check)  Post Treatment  Dialyzer Clearance Lightly streaked  Duration of HD Treatment -hour(s) 2.5 hour(s) (2hrs 36 min)  Hemodialysis Intake (mL) 0 mL  Liters Processed 58.1  Fluid Removed (mL) 1900 mL  Tolerated HD Treatment Yes  Post-Hemodialysis Comments (S)  tx ended 54 min early d/t pt signing off AMA stating he was going to miss his ride. however, he has until 5pm to catch his ride. pt also clotted off 1st system. UF goal not met, blood rinsed back, VSS. Medication Admin: Heaprin 2000 units bolus  AVG/AVF Arterial Site Held (minutes) 5 minutes  AVG/AVF Venous Site Held (minutes) 5 minutes  Fistula / Graft Right Upper arm Arteriovenous vein graft  Placement Date: 10/31/22   Placed prior to admission: Yes  Orientation: Right  Access Location: Upper arm  Access Type: Arteriovenous vein graft  Site Condition No complications  Fistula / Graft Assessment Bruit;Thrill;Present  Drainage Description None

## 2023-02-25 ENCOUNTER — Emergency Department (HOSPITAL_COMMUNITY)
Admission: EM | Admit: 2023-02-25 | Discharge: 2023-02-25 | Disposition: A | Discharge status: Home / Self Care | Payer: 59 | Attending: Emergency Medicine | Admitting: Emergency Medicine

## 2023-02-25 DIAGNOSIS — Z01812 Encounter for preprocedural laboratory examination: Secondary | ICD-10-CM | POA: Diagnosis present

## 2023-02-25 DIAGNOSIS — Z7982 Long term (current) use of aspirin: Secondary | ICD-10-CM | POA: Diagnosis not present

## 2023-02-25 DIAGNOSIS — Z0189 Encounter for other specified special examinations: Secondary | ICD-10-CM

## 2023-02-25 DIAGNOSIS — Z794 Long term (current) use of insulin: Secondary | ICD-10-CM | POA: Insufficient documentation

## 2023-02-25 LAB — RENAL FUNCTION PANEL
Albumin: 3.5 g/dL (ref 3.5–5.0)
Anion gap: 13 (ref 5–15)
BUN: 62 mg/dL — ABNORMAL HIGH (ref 8–23)
CO2: 28 mmol/L (ref 22–32)
Calcium: 8.8 mg/dL — ABNORMAL LOW (ref 8.9–10.3)
Chloride: 97 mmol/L — ABNORMAL LOW (ref 98–111)
Creatinine, Ser: 5.41 mg/dL — ABNORMAL HIGH (ref 0.61–1.24)
GFR, Estimated: 11 mL/min — ABNORMAL LOW (ref >60)
Glucose, Bld: 254 mg/dL — ABNORMAL HIGH (ref 70–99)
Phosphorus: 3.7 mg/dL (ref 2.5–4.6)
Potassium: 3.6 mmol/L (ref 3.5–5.1)
Sodium: 138 mmol/L (ref 135–145)

## 2023-02-25 LAB — CBC
HCT: 27.3 % — ABNORMAL LOW (ref 39.0–52.0)
Hemoglobin: 8.6 g/dL — ABNORMAL LOW (ref 13.0–17.0)
MCH: 28.9 pg (ref 26.0–34.0)
MCHC: 31.5 g/dL (ref 30.0–36.0)
MCV: 91.6 fL (ref 80.0–100.0)
Platelets: 110 10*3/uL — ABNORMAL LOW (ref 150–400)
RBC: 2.98 MIL/uL — ABNORMAL LOW (ref 4.22–5.81)
RDW: 14.6 % (ref 11.5–15.5)
WBC: 7 10*3/uL (ref 4.0–10.5)
nRBC: 0 % (ref 0.0–0.2)

## 2023-02-25 MED ORDER — CHLORHEXIDINE GLUCONATE CLOTH 2 % EX PADS: 6.0000 | MEDICATED_PAD | Freq: Every day | CUTANEOUS | Status: DC

## 2023-02-25 NOTE — Progress Notes (Signed)
Aware that Mr. Pint is in the ED requesting dialysis. Called and spoke to him via his cell phone to inform him that HD would be this evening due to high census and staffing. He denies dyspnea and thinks that he will probably leave and then come back either Monday or Tuesday instead. Will send secure text to MD and RN in ED.   Veneta Penton, PA-C [employer] Pager [phone]

## 2023-02-25 NOTE — ED Notes (Signed)
DC instructions reviewed with pt. Pt verbalized understanding.  PT DC.  

## 2023-02-25 NOTE — ED Triage Notes (Signed)
Patient here requesting dialysis. Patient denies any complaints, is alert, oriented, and in no apparent distress at this time.

## 2023-02-25 NOTE — Progress Notes (Signed)
Patient ID: [name], male   DOB: Apr 06, 1961, 62 y.o.   MRN: [id number] Consulted by Dr. Ronnald Nian for provision of hemodialysis service to Mr. Fetterman who does not have an assigned OP HD unit after dismissal for breach of behavioral contract. Orders placed for dialysis and return back to ER upon conclusion.  Elmarie Shiley MD [facility]. Office # [phone] Pager # [phone] 7:51 AM

## 2023-02-25 NOTE — Discharge Instructions (Addendum)
Please come back Monday or Tuesday for dialysis as discussed with nephrology team.  Your lab work today is normal.  Please return if you develop any severe shortness of breath or other concerning symptoms.

## 2023-02-25 NOTE — ED Provider Notes (Signed)
[facility] Provider Note   CSN: [id number] Arrival date & time: 02/25/23  [id number]     History  Dialysis   Jeffrey [name] is a 62 y.o. male.  Patient here for his dialysis session.  He receives his dialysis here Tuesday Thursdays and Saturdays.  He is not having any symptoms.  He has been compliant here recently.  The history is provided by the patient.       Home Medications Prior to Admission medications   Medication Sig Start Date End Date Taking? Authorizing Provider  amLODipine (NORVASC) 10 MG tablet Take 10 mg by mouth every morning. 04/17/20   [provider]  ASPIRIN LOW DOSE 81 MG EC tablet Take 81 mg by mouth in the morning. 02/22/21   [provider]  atorvastatin (LIPITOR) 40 MG tablet Take 40 mg by mouth in the morning. 02/09/21   [provider]  calcium acetate (PHOSLO) 667 MG capsule Take 2 capsules (1,334 mg total) by mouth with breakfast, with lunch, and with evening meal. 04/10/21   Debbe Odea, MD  carvedilol (COREG) 12.5 MG tablet Take 12.5 mg by mouth 2 (two) times daily with a meal. 04/17/20   [provider]  Cholecalciferol (VITAMIN D) 50 MCG (2000 UT) CAPS Take 2,000 Units by mouth daily.    [provider]  clopidogrel (PLAVIX) 75 MG tablet Take 1 tablet (75 mg total) by mouth daily. 11/02/22   Rhyne, Hulen Shouts, PA-C  cyclobenzaprine (FLEXERIL) 10 MG tablet Take 10 mg by mouth 3 (three) times daily as needed for muscle spasms. 12/29/21   [provider]  diazepam (VALIUM) 5 MG tablet Take 5 mg by mouth 2 (two) times daily as needed for muscle spasms. 12/09/21   [provider]  diphenhydrAMINE (BENADRYL) 25 MG tablet Take 50 mg by mouth every 6 (six) hours as needed for itching.    [provider]  insulin regular human CONCENTRATED (HUMULIN R U-500 KWIKPEN) 500 UNIT/ML KwikPen Inject 45 Units into the skin daily with supper. Patient taking  differently: Inject 140 Units into the skin 2 (two) times daily with a meal. 09/04/21 08/30/24  [name], MD  metolazone (ZAROXOLYN) 10 MG tablet Take 10 mg by mouth daily. 07/13/21   [provider]  nortriptyline (PAMELOR) 10 MG capsule Take 20 mg by mouth at bedtime. 10/23/19 08/30/24  [provider]  oxyCODONE (ROXICODONE) 5 MG immediate release tablet Take 1 tablet (5 mg total) by mouth every 8 (eight) hours as needed. 12/31/22 12/31/23  [place] [name], DO  oxymetazoline (AFRIN) 0.05 % nasal spray Place 1 spray into both nostrils daily as needed (nose bleeds).    [provider]  pantoprazole (PROTONIX) 40 MG tablet Take 40 mg by mouth daily before breakfast. 02/09/21   [provider]  pregabalin (LYRICA) 75 MG capsule Take 1 capsule (75 mg total) by mouth See admin instructions. Daily.  Give after dialysis on dialysis days. 01/21/23   [name], MD  sertraline (ZOLOFT) 100 MG tablet Take 100 mg by mouth in the morning. 02/22/21   [provider]  sodium chloride (OCEAN) 0.65 % SOLN nasal spray Place 2 sprays into both nostrils in the morning and at bedtime.    [provider]  tadalafil (CIALIS) 20 MG tablet Take 20 mg by mouth daily as needed for erectile dysfunction.    [provider]  tamsulosin (FLOMAX) 0.4 MG CAPS capsule Take 1 capsule (  0.4 mg total) by mouth daily. 11/10/21   Darliss Cheney, MD  torsemide (DEMADEX) 20 MG tablet Take 120 mg by mouth 2 (two) times daily.    [provider]  traZODone (DESYREL) 150 MG tablet Take 150 mg by mouth at bedtime. 12/09/21   [provider]      Allergies    Actos [pioglitazone], Dexmedetomidine, Ibuprofen, Tomato, and Wellbutrin [bupropion]    Review of Systems   Review of Systems  Physical Exam Updated Vital Signs BP 130/61   Pulse 86   Temp 98 F (36.7 C) (Oral)   Resp 16   SpO2 100%  Physical Exam Vitals and nursing note reviewed.   Constitutional:      General: He is not in acute distress.    Appearance: He is well-developed.  HENT:     Head: Normocephalic and atraumatic.  Eyes:     Conjunctiva/sclera: Conjunctivae normal.  Cardiovascular:     Rate and Rhythm: Normal rate and regular rhythm.     Heart sounds: No murmur heard. Pulmonary:     Effort: Pulmonary effort is normal. No respiratory distress.     Breath sounds: Normal breath sounds.  Abdominal:     Palpations: Abdomen is soft.     Tenderness: There is no abdominal tenderness.  Musculoskeletal:        General: No swelling.     Cervical back: Neck supple.  Skin:    General: Skin is warm and dry.     Capillary Refill: Capillary refill takes less than 2 seconds.  Neurological:     Mental Status: He is alert.  Psychiatric:        Mood and Affect: Mood normal.     ED Results / Procedures / Treatments   Labs (all labs ordered are listed, but only abnormal results are displayed) Labs Reviewed  CBC - Abnormal; Notable for the following components:      Result Value   RBC 2.98 (*)    Hemoglobin 8.6 (*)    HCT 27.3 (*)    Platelets 110 (*)    All other components within normal limits  RENAL FUNCTION PANEL - Abnormal; Notable for the following components:   Chloride 97 (*)    Glucose, Bld 254 (*)    BUN 62 (*)    Creatinine, Ser 5.41 (*)    Calcium 8.8 (*)    GFR, Estimated 11 (*)    All other components within normal limits    EKG None  Radiology No results found.  Procedures Procedures    Medications Ordered in ED Medications  Chlorhexidine Gluconate Cloth 2 % PADS 6 each (has no administration in time range)    ED Course/ Medical Decision Making/ A&P                             Medical Decision Making Amount and/or Complexity of Data Reviewed Labs: ordered.   Jeffrey [name] is here for dialysis.  We are currently his dialysis center.  Normal vitals.  No fever.  Unfortunately due to staffing they do not have time to  dialyze him today.  Blood work today is reassuring and no need for emergent dialysis.  He will come back Monday or Tuesday after talking with nephrology team.  Patient discharged in good condition.  This chart was dictated using voice recognition software.  Despite best efforts to proofread,  errors can occur which can change the documentation meaning.  Final Clinical Impression(s) / ED Diagnoses Final diagnoses:  Encounter for laboratory examination    Rx / DC Orders ED Discharge Orders     None         Lennice Sites, DO 02/25/23 0908

## 2023-02-27 ENCOUNTER — Emergency Department (HOSPITAL_COMMUNITY)
Admission: EM | Admit: 2023-02-27 | Discharge: 2023-02-28 | Disposition: A | Discharge status: Home / Self Care | Payer: 59 | Attending: Emergency Medicine | Admitting: Emergency Medicine

## 2023-02-27 ENCOUNTER — Emergency Department (HOSPITAL_COMMUNITY): Payer: 59

## 2023-02-27 DIAGNOSIS — E1122 Type 2 diabetes mellitus with diabetic chronic kidney disease: Secondary | ICD-10-CM | POA: Diagnosis not present

## 2023-02-27 DIAGNOSIS — I132 Hypertensive heart and chronic kidney disease with heart failure and with stage 5 chronic kidney disease, or end stage renal disease: Secondary | ICD-10-CM | POA: Insufficient documentation

## 2023-02-27 DIAGNOSIS — Z79899 Other long term (current) drug therapy: Secondary | ICD-10-CM | POA: Diagnosis not present

## 2023-02-27 DIAGNOSIS — M79601 Pain in right arm: Secondary | ICD-10-CM | POA: Diagnosis present

## 2023-02-27 DIAGNOSIS — R0602 Shortness of breath: Secondary | ICD-10-CM | POA: Diagnosis not present

## 2023-02-27 DIAGNOSIS — Z794 Long term (current) use of insulin: Secondary | ICD-10-CM | POA: Diagnosis not present

## 2023-02-27 DIAGNOSIS — I509 Heart failure, unspecified: Secondary | ICD-10-CM | POA: Insufficient documentation

## 2023-02-27 DIAGNOSIS — Z7982 Long term (current) use of aspirin: Secondary | ICD-10-CM | POA: Diagnosis not present

## 2023-02-27 DIAGNOSIS — Z992 Dependence on renal dialysis: Secondary | ICD-10-CM | POA: Diagnosis not present

## 2023-02-27 DIAGNOSIS — J449 Chronic obstructive pulmonary disease, unspecified: Secondary | ICD-10-CM | POA: Insufficient documentation

## 2023-02-27 DIAGNOSIS — N186 End stage renal disease: Secondary | ICD-10-CM | POA: Insufficient documentation

## 2023-02-27 LAB — CBC
HCT: 25.7 % — ABNORMAL LOW (ref 39.0–52.0)
Hemoglobin: 8.3 g/dL — ABNORMAL LOW (ref 13.0–17.0)
MCH: 29 pg (ref 26.0–34.0)
MCHC: 32.3 g/dL (ref 30.0–36.0)
MCV: 89.9 fL (ref 80.0–100.0)
Platelets: 111 10*3/uL — ABNORMAL LOW (ref 150–400)
RBC: 2.86 MIL/uL — ABNORMAL LOW (ref 4.22–5.81)
RDW: 14.2 % (ref 11.5–15.5)
WBC: 6.3 10*3/uL (ref 4.0–10.5)
nRBC: 0 % (ref 0.0–0.2)

## 2023-02-27 MED ORDER — CHLORHEXIDINE GLUCONATE CLOTH 2 % EX PADS: 6.0000 | MEDICATED_PAD | Freq: Every day | CUTANEOUS | Status: DC

## 2023-02-27 MED ORDER — OXYCODONE HCL 5 MG PO TABS
10.0000 mg | ORAL_TABLET | Freq: Once | ORAL | Status: AC
  Administered 2023-02-27: 10 mg via ORAL
  Filled 2023-02-27: qty 2

## 2023-02-27 NOTE — ED Provider Notes (Signed)
[facility] Provider Note   CSN: [id number] Arrival date & time: 02/27/23  2009     History {Add pertinent medical, surgical, social history, OB history to HPI:1} Chief Complaint  Patient presents with   Shortness of Breath    Jeffrey [name] is a 62 y.o. male.  HPI     Home Medications Prior to Admission medications   Medication Sig Start Date End Date Taking? Authorizing Provider  amLODipine (NORVASC) 10 MG tablet Take 10 mg by mouth every morning. 04/17/20   [provider]  ASPIRIN LOW DOSE 81 MG EC tablet Take 81 mg by mouth in the morning. 02/22/21   [provider]  atorvastatin (LIPITOR) 40 MG tablet Take 40 mg by mouth in the morning. 02/09/21   [provider]  calcium acetate (PHOSLO) 667 MG capsule Take 2 capsules (1,334 mg total) by mouth with breakfast, with lunch, and with evening meal. 04/10/21   Debbe Odea, MD  carvedilol (COREG) 12.5 MG tablet Take 12.5 mg by mouth 2 (two) times daily with a meal. 04/17/20   [provider]  Cholecalciferol (VITAMIN D) 50 MCG (2000 UT) CAPS Take 2,000 Units by mouth daily.    [provider]  clopidogrel (PLAVIX) 75 MG tablet Take 1 tablet (75 mg total) by mouth daily. 11/02/22   Rhyne, Hulen Shouts, PA-C  cyclobenzaprine (FLEXERIL) 10 MG tablet Take 10 mg by mouth 3 (three) times daily as needed for muscle spasms. 12/29/21   [provider]  diazepam (VALIUM) 5 MG tablet Take 5 mg by mouth 2 (two) times daily as needed for muscle spasms. 12/09/21   [provider]  diphenhydrAMINE (BENADRYL) 25 MG tablet Take 50 mg by mouth every 6 (six) hours as needed for itching.    [provider]  insulin regular human CONCENTRATED (HUMULIN R U-500 KWIKPEN) 500 UNIT/ML KwikPen Inject 45 Units into the skin daily with supper. Patient taking differently: Inject 140 Units into the skin 2 (two) times daily with a meal. 09/04/21 08/30/24   [name], MD  metolazone (ZAROXOLYN) 10 MG tablet Take 10 mg by mouth daily. 07/13/21   [provider]  nortriptyline (PAMELOR) 10 MG capsule Take 20 mg by mouth at bedtime. 10/23/19 08/30/24  [provider]  oxyCODONE (ROXICODONE) 5 MG immediate release tablet Take 1 tablet (5 mg total) by mouth every 8 (eight) hours as needed. 12/31/22 12/31/23  [place] [name], DO  oxymetazoline (AFRIN) 0.05 % nasal spray Place 1 spray into both nostrils daily as needed (nose bleeds).    [provider]  pantoprazole (PROTONIX) 40 MG tablet Take 40 mg by mouth daily before breakfast. 02/09/21   [provider]  pregabalin (LYRICA) 75 MG capsule Take 1 capsule (75 mg total) by mouth See admin instructions. Daily.  Give after dialysis on dialysis days. 01/21/23   [name], MD  sertraline (ZOLOFT) 100 MG tablet Take 100 mg by mouth in the morning. 02/22/21   [provider]  sodium chloride (OCEAN) 0.65 % SOLN nasal spray Place 2 sprays into both nostrils in the morning and at bedtime.    [provider]  tadalafil (CIALIS) 20 MG tablet Take 20 mg by mouth daily as needed for erectile dysfunction.    [provider]  tamsulosin (FLOMAX) 0.4 MG CAPS capsule Take 1 capsule (0.4 mg total) by mouth daily. 11/10/21   Darliss Cheney, MD  torsemide (DEMADEX) 20 MG tablet Take  120 mg by mouth 2 (two) times daily.    [provider]  traZODone (DESYREL) 150 MG tablet Take 150 mg by mouth at bedtime. 12/09/21   [provider]      Allergies    Actos [pioglitazone], Dexmedetomidine, Ibuprofen, Tomato, and Wellbutrin [bupropion]    Review of Systems   Review of Systems  Physical Exam Updated Vital Signs BP (!) 135/57 (BP Location: Left Arm)   Pulse 89   Temp 98.1 F (36.7 C) (Oral)   Resp 16   Ht 5\' 5"  (1.651 m)   Wt 128 kg   SpO2 100%   BMI 46.96 kg/m  Physical Exam  ED Results / Procedures / Treatments   Labs (all labs  ordered are listed, but only abnormal results are displayed) Labs Reviewed  CBC  BASIC METABOLIC PANEL    EKG None  Radiology No results found.  Procedures Procedures  {Document cardiac monitor, telemetry assessment procedure when appropriate:1}  Medications Ordered in ED Medications - No data to display  ED Course/ Medical Decision Making/ A&P   {   Click here for ABCD2, HEART and other calculatorsREFRESH Note before signing :1}                          Medical Decision Making Amount and/or Complexity of Data Reviewed Labs: ordered. Radiology: ordered.   ***  {Document critical care time when appropriate:1} {Document review of labs and clinical decision tools ie heart score, Chads2Vasc2 etc:1}  {Document your independent review of radiology images, and any outside records:1} {Document your discussion with family members, caretakers, and with consultants:1} {Document social determinants of health affecting pt's care:1} {Document your decision making why or why not admission, treatments were needed:1} Final Clinical Impression(s) / ED Diagnoses Final diagnoses:  None    Rx / DC Orders ED Discharge Orders     None

## 2023-02-27 NOTE — ED Triage Notes (Signed)
Pt c/o abdo and lt arm pain. Missed dialysis on sat. On 4L [place] at home

## 2023-02-27 NOTE — ED Triage Notes (Signed)
Pain is to rt arm, previous note was written in error. Pt states arm has been swelling and dialysis painful. Due for fistula change on Friday this week

## 2023-02-27 NOTE — ED Provider Notes (Incomplete)
[facility] Provider Note   CSN: [id number] Arrival date & time: 02/27/23  2009     History {Add pertinent medical, surgical, social history, OB history to HPI:1} Chief Complaint  Patient presents with  . Shortness of Breath    Jeffrey [name] is a 62 y.o. male.  HPI 62 year old male with history of diabetes, hypertension, CHF, COPD, ESRD on HD who presents here for dialysis after being discharged from the outpatient dialysis center.  He is complaining of worsening right arm pain and swelling.  He sees Dr. Gwenlyn Saran, and has a ligation of his AV graft Mliss Fritz this Friday.  He states that he has been unable to use his right arm today due to the swelling and the pain and states "I do not know if I will be able to make it to Friday".  He has been followed by Dr. 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  He takes aspirin and Plavix.  He also states that he arrived here on Saturday for dialysis but they were so backed up that he had to leave.  He also presents with worsening shortness of breath.    Home Medications Prior to Admission medications   Medication Sig Start Date End Date Taking? Authorizing Provider  amLODipine (NORVASC) 10 MG tablet Take 10 mg by mouth every morning. 04/17/20   [provider]  ASPIRIN LOW DOSE 81 MG EC tablet Take 81 mg by mouth in the morning. 02/22/21   [provider]  atorvastatin (LIPITOR) 40 MG tablet Take 40 mg by mouth in the morning. 02/09/21   [provider]  calcium acetate (PHOSLO) 667 MG capsule Take 2 capsules (1,334 mg total) by mouth with breakfast, with lunch, and with evening meal. 04/10/21   Debbe Odea, MD  carvedilol (COREG) 12.5 MG tablet Take 12.5 mg by mouth 2 (two) times daily with a meal. 04/17/20   [provider]   Cholecalciferol (VITAMIN D) 50 MCG (2000 UT) CAPS Take 2,000 Units by mouth daily.    [provider]  clopidogrel (PLAVIX) 75 MG tablet Take 1 tablet (75 mg total) by mouth daily. 11/02/22   Rhyne, Hulen Shouts, PA-C  cyclobenzaprine (FLEXERIL) 10 MG tablet Take 10 mg by mouth 3 (three) times daily as needed for muscle spasms. 12/29/21   [provider]  diazepam (VALIUM) 5 MG tablet Take 5 mg by mouth 2 (two) times daily as needed for muscle spasms. 12/09/21   [provider]  diphenhydrAMINE (BENADRYL) 25 MG tablet Take 50 mg by mouth every 6 (six) hours as needed for itching.    [provider]  insulin regular human CONCENTRATED (HUMULIN R U-500 KWIKPEN) 500 UNIT/ML KwikPen Inject 45 Units into the skin daily with supper. Patient taking differently: Inject 140 Units into the skin 2 (two) times daily with a meal. 09/04/21 08/30/24  [name], MD  metolazone (ZAROXOLYN) 10 MG tablet Take 10 mg by mouth daily. 07/13/21   [provider]  nortriptyline (PAMELOR) 10 MG capsule Take 20 mg by mouth at bedtime. 10/23/19 08/30/24  [provider]  oxyCODONE (ROXICODONE) 5 MG immediate release tablet Take 1 tablet (5 mg total) by mouth every 8 (eight) hours as needed. 12/31/22 12/31/23  [place] [name], DO  oxymetazoline (AFRIN) 0.05 % nasal spray Place  1 spray into both nostrils daily as needed (nose bleeds).    [provider]  pantoprazole (PROTONIX) 40 MG tablet Take 40 mg by mouth daily before breakfast. 02/09/21   [provider]  pregabalin (LYRICA) 75 MG capsule Take 1 capsule (75 mg total) by mouth See admin instructions. Daily.  Give after dialysis on dialysis days. 01/21/23   [name], MD  sertraline (ZOLOFT) 100 MG tablet Take 100 mg by mouth in the morning. 02/22/21   [provider]  sodium chloride (OCEAN) 0.65 % SOLN nasal spray Place 2 sprays into both nostrils in the morning and at bedtime.    [provider]  tadalafil (CIALIS) 20 MG tablet Take 20 mg by mouth daily as needed for erectile dysfunction.    [provider]  tamsulosin (FLOMAX) 0.4 MG CAPS capsule Take 1 capsule (0.4 mg total) by mouth daily. 11/10/21   Darliss Cheney, MD  torsemide (DEMADEX) 20 MG tablet Take 120 mg by mouth 2 (two) times daily.    [provider]  traZODone (DESYREL) 150 MG tablet Take 150 mg by mouth at bedtime. 12/09/21   [provider]      Allergies    Actos [pioglitazone], Dexmedetomidine, Ibuprofen, Tomato, and Wellbutrin [bupropion]    Review of Systems   Review of Systems Ten systems reviewed and are negative for acute change, except as noted in the HPI.   Physical Exam Updated Vital Signs BP (!) 135/57 (BP Location: Left Arm)   Pulse 89   Temp 98.1 F (36.7 C) (Oral)   Resp 16   Ht 5\' 5"  (1.651 m)   Wt 128 kg   SpO2 100%   BMI 46.96 kg/m  Physical Exam Vitals and nursing note reviewed.  Constitutional:      General: He is not in acute distress.    Appearance: He is well-developed.  HENT:     Head: Normocephalic and atraumatic.  Eyes:     Conjunctiva/sclera: Conjunctivae normal.  Cardiovascular:     Rate and Rhythm: Normal rate and regular rhythm.     Heart sounds: No murmur heard. Pulmonary:     Effort: Pulmonary effort is normal. No respiratory distress.     Breath sounds: Normal breath sounds.     Comments: On [place]  Abdominal:     Palpations: Abdomen is soft.     Tenderness: There is no abdominal tenderness.  Musculoskeletal:        General: No swelling.     Cervical back: Neck supple.     Comments: Right arm with 3+ nonpitting edema, 2+ radial pulses, right AV fistula with palpable thrill.  No overlying warmth or erythema.    Skin:    General: Skin is warm and dry.     Capillary Refill: Capillary refill takes less than 2 seconds.  Neurological:     Mental Status: He is alert.  Psychiatric:        Mood and Affect: Mood normal.      ED Results / Procedures / Treatments   Labs (all labs ordered are listed, but only abnormal results are displayed) Labs Reviewed  CBC  BASIC METABOLIC PANEL    EKG None  Radiology No results found.  Procedures Procedures  {Document cardiac monitor, telemetry assessment procedure when appropriate:1}  Medications Ordered in ED Medications - No data to display  ED Course/ Medical Decision Making/ A&P   {   Click here for ABCD2, HEART and other calculatorsREFRESH Note before signing :  1}                          Medical Decision Making Amount and/or Complexity of Data Reviewed Labs: ordered. Radiology: ordered.  62 year old gentleman presenting with need for dialysis and complaints of swelling to his right arm with increasing pain.  He is scheduled for ligation of the AV graft with Dr. Gwenlyn Saran on Friday.  He also did not receive dialysis on Saturday due to lack of staffing.  On exam he has 3+ nonpitting edema, radial pulses are intact, and AV fistula with palpable thrill.  Overall no evidence of cellulitis.  He has been having ongoing issues with this for over a year.  I spoke with Dr. Carlis Abbott with vascular surgery about the patient's case, who states that there is no emergent indication to take the patient to the OR earlier.  I also spoke with Dr. Royce Macadamia with nephrology who states that he will likely be dialyzed tomorrow morning.  Lab work ordered,   Final Clinical Impression(s) / ED Diagnoses Final diagnoses:  None    Rx / DC Orders ED Discharge Orders     None

## 2023-02-28 ENCOUNTER — Encounter (HOSPITAL_COMMUNITY): Payer: Self-pay | Admitting: Vascular Surgery

## 2023-02-28 DIAGNOSIS — N186 End stage renal disease: Secondary | ICD-10-CM | POA: Diagnosis not present

## 2023-02-28 DIAGNOSIS — I120 Hypertensive chronic kidney disease with stage 5 chronic kidney disease or end stage renal disease: Secondary | ICD-10-CM | POA: Diagnosis not present

## 2023-02-28 LAB — BASIC METABOLIC PANEL
Anion gap: 12 (ref 5–15)
BUN: 82 mg/dL — ABNORMAL HIGH (ref 8–23)
CO2: 29 mmol/L (ref 22–32)
Calcium: 8.1 mg/dL — ABNORMAL LOW (ref 8.9–10.3)
Chloride: 91 mmol/L — ABNORMAL LOW (ref 98–111)
Creatinine, Ser: 5.07 mg/dL — ABNORMAL HIGH (ref 0.61–1.24)
GFR, Estimated: 12 mL/min — ABNORMAL LOW (ref >60)
Glucose, Bld: 467 mg/dL — ABNORMAL HIGH (ref 70–99)
Potassium: 3.7 mmol/L (ref 3.5–5.1)
Sodium: 132 mmol/L — ABNORMAL LOW (ref 135–145)

## 2023-02-28 LAB — RENAL FUNCTION PANEL
Albumin: 3.4 g/dL — ABNORMAL LOW (ref 3.5–5.0)
Anion gap: 14 (ref 5–15)
BUN: 83 mg/dL — ABNORMAL HIGH (ref 8–23)
CO2: 29 mmol/L (ref 22–32)
Calcium: 8.4 mg/dL — ABNORMAL LOW (ref 8.9–10.3)
Chloride: 90 mmol/L — ABNORMAL LOW (ref 98–111)
Creatinine, Ser: 5.35 mg/dL — ABNORMAL HIGH (ref 0.61–1.24)
GFR, Estimated: 11 mL/min — ABNORMAL LOW (ref >60)
Glucose, Bld: 396 mg/dL — ABNORMAL HIGH (ref 70–99)
Phosphorus: 5.1 mg/dL — ABNORMAL HIGH (ref 2.5–4.6)
Potassium: 3.7 mmol/L (ref 3.5–5.1)
Sodium: 133 mmol/L — ABNORMAL LOW (ref 135–145)

## 2023-02-28 LAB — CBC
HCT: 25.4 % — ABNORMAL LOW (ref 39.0–52.0)
Hemoglobin: 8.2 g/dL — ABNORMAL LOW (ref 13.0–17.0)
MCH: 29 pg (ref 26.0–34.0)
MCHC: 32.3 g/dL (ref 30.0–36.0)
MCV: 89.8 fL (ref 80.0–100.0)
Platelets: 117 10*3/uL — ABNORMAL LOW (ref 150–400)
RBC: 2.83 MIL/uL — ABNORMAL LOW (ref 4.22–5.81)
RDW: 14.2 % (ref 11.5–15.5)
WBC: 5.5 10*3/uL (ref 4.0–10.5)
nRBC: 0 % (ref 0.0–0.2)

## 2023-02-28 LAB — CBG MONITORING, ED
Glucose-Capillary: 451 mg/dL — ABNORMAL HIGH (ref 70–99)
Glucose-Capillary: 371 mg/dL — ABNORMAL HIGH (ref 70–99)

## 2023-02-28 LAB — HEPATITIS B SURFACE ANTIGEN: Hepatitis B Surface Ag: NONREACTIVE

## 2023-02-28 MED ORDER — CARVEDILOL 12.5 MG PO TABS
12.5000 mg | ORAL_TABLET | Freq: Two times a day (BID) | ORAL | Status: DC
  Administered 2023-02-28: 12.5 mg via ORAL
  Filled 2023-02-28: qty 1

## 2023-02-28 MED ORDER — MORPHINE SULFATE (PF) 4 MG/ML IV SOLN
4.0000 mg | Freq: Once | INTRAVENOUS | Status: AC
  Administered 2023-02-28: 4 mg via INTRAVENOUS
  Filled 2023-02-28: qty 1

## 2023-02-28 MED ORDER — INSULIN REGULAR HUMAN (CONC) 500 UNIT/ML ~~LOC~~ SOLN
125.0000 [IU] | Freq: Two times a day (BID) | SUBCUTANEOUS | Status: DC
  Filled 2023-02-28: qty 20

## 2023-02-28 MED ORDER — PREGABALIN 25 MG PO CAPS: 75.0000 mg | ORAL_CAPSULE | Freq: Every day | ORAL | Status: DC | PRN

## 2023-02-28 MED ORDER — CLOPIDOGREL BISULFATE 75 MG PO TABS: 75.0000 mg | ORAL_TABLET | Freq: Every day | ORAL | Status: DC

## 2023-02-28 MED ORDER — PREGABALIN 25 MG PO CAPS: 75.0000 mg | ORAL_CAPSULE | ORAL | Status: DC

## 2023-02-28 MED ORDER — LIDOCAINE-PRILOCAINE 2.5-2.5 % EX CREA: 1.0000 | TOPICAL_CREAM | CUTANEOUS | Status: DC | PRN

## 2023-02-28 MED ORDER — TAMSULOSIN HCL 0.4 MG PO CAPS: 0.4000 mg | ORAL_CAPSULE | Freq: Every day | ORAL | Status: DC

## 2023-02-28 MED ORDER — ASPIRIN 81 MG PO TBEC
81.0000 mg | DELAYED_RELEASE_TABLET | Freq: Every morning | ORAL | Status: DC
  Administered 2023-02-28: 81 mg via ORAL
  Filled 2023-02-28: qty 1

## 2023-02-28 MED ORDER — CALCIUM ACETATE (PHOS BINDER) 667 MG PO CAPS: 1334.0000 mg | ORAL_CAPSULE | Freq: Three times a day (TID) | ORAL | Status: DC

## 2023-02-28 MED ORDER — TORSEMIDE 20 MG PO TABS: 120.0000 mg | ORAL_TABLET | Freq: Two times a day (BID) | ORAL | Status: DC

## 2023-02-28 MED ORDER — ANTICOAGULANT SODIUM CITRATE 4% (200MG/5ML) IV SOLN: 5.0000 mL | Status: DC | PRN

## 2023-02-28 MED ORDER — ATORVASTATIN CALCIUM 40 MG PO TABS
40.0000 mg | ORAL_TABLET | Freq: Every morning | ORAL | Status: DC
  Administered 2023-02-28: 40 mg via ORAL
  Filled 2023-02-28: qty 1

## 2023-02-28 MED ORDER — METOLAZONE 5 MG PO TABS
10.0000 mg | ORAL_TABLET | Freq: Every day | ORAL | Status: DC
  Filled 2023-02-28: qty 2

## 2023-02-28 MED ORDER — HEPARIN SODIUM (PORCINE) 1000 UNIT/ML DIALYSIS: 1000.0000 [IU] | INTRAMUSCULAR | Status: DC | PRN

## 2023-02-28 MED ORDER — SERTRALINE HCL 100 MG PO TABS
100.0000 mg | ORAL_TABLET | Freq: Every morning | ORAL | Status: DC
  Administered 2023-02-28: 100 mg via ORAL
  Filled 2023-02-28: qty 1

## 2023-02-28 MED ORDER — LIDOCAINE HCL (PF) 1 % IJ SOLN: 5.0000 mL | INTRAMUSCULAR | Status: DC | PRN

## 2023-02-28 MED ORDER — INSULIN ASPART 100 UNIT/ML IJ SOLN
25.0000 [IU] | Freq: Once | INTRAMUSCULAR | Status: AC
  Administered 2023-02-28: 25 [IU] via INTRAVENOUS

## 2023-02-28 MED ORDER — PANTOPRAZOLE SODIUM 40 MG PO TBEC: 40.0000 mg | DELAYED_RELEASE_TABLET | Freq: Every day | ORAL | Status: DC

## 2023-02-28 MED ORDER — AMLODIPINE BESYLATE 5 MG PO TABS: 10.0000 mg | ORAL_TABLET | Freq: Every morning | ORAL | Status: DC

## 2023-02-28 MED ORDER — PENTAFLUOROPROP-TETRAFLUOROETH EX AERO: 1.0000 | INHALATION_SPRAY | CUTANEOUS | Status: DC | PRN

## 2023-02-28 MED ORDER — INSULIN REGULAR HUMAN (CONC) 500 UNIT/ML ~~LOC~~ SOPN
125.0000 [IU] | PEN_INJECTOR | Freq: Two times a day (BID) | SUBCUTANEOUS | Status: DC
  Filled 2023-02-28: qty 3

## 2023-02-28 MED ORDER — ALTEPLASE 2 MG IJ SOLR: 2.0000 mg | Freq: Once | INTRAMUSCULAR | Status: DC | PRN

## 2023-02-28 MED ORDER — OXYCODONE HCL 5 MG PO TABS
5.0000 mg | ORAL_TABLET | Freq: Three times a day (TID) | ORAL | Status: DC | PRN
  Administered 2023-02-28: 5 mg via ORAL
  Filled 2023-02-28: qty 1

## 2023-02-28 NOTE — Anesthesia Preprocedure Evaluation (Addendum)
Anesthesia Evaluation  Patient identified by MRN, date of birth, ID band Patient awake    Reviewed: Allergy & Precautions, NPO status , Patient's Chart, lab work & pertinent test results  Airway Mallampati: III  TM Distance: >3 FB Neck ROM: Full    Dental  (+) Edentulous Upper, Missing, Poor Dentition   Pulmonary sleep apnea and Continuous Positive Airway Pressure Ventilation , COPD (3L [place]),  COPD inhaler and oxygen dependent, former smoker   Pulmonary exam normal        Cardiovascular hypertension, Pt. on medications and Pt. on home beta blockers + CAD, + Past MI, + Peripheral Vascular Disease and +CHF  Normal cardiovascular exam  ECHO: (10/2021)  1. Left ventricular ejection fraction, by estimation, is 60 to 65%. The  left ventricle has normal function. The left ventricle has no regional  wall motion abnormalities. Left ventricular diastolic parameters were  normal.   2. Right ventricular systolic function is normal. The right ventricular  size is normal. Tricuspid regurgitation signal is inadequate for assessing  PA pressure.   3. The mitral valve is grossly normal. No evidence of mitral valve  regurgitation. No evidence of mitral stenosis.   4. The aortic valve is tricuspid. There is mild calcification of the  aortic valve. Aortic valve regurgitation is not visualized. Aortic valve  sclerosis/calcification is present, without any evidence of aortic  stenosis.   5. The inferior vena cava is normal in size with greater than 50%  respiratory variability, suggesting right atrial pressure of 3 mmHg.     Neuro/Psych  PSYCHIATRIC DISORDERS Anxiety Depression     Neuromuscular disease    GI/Hepatic Neg liver ROS,GERD  Medicated and Controlled,,  Endo/Other  diabetes, Insulin Dependent  Morbid obesity  Renal/GU ESRF and DialysisRenal disease     Musculoskeletal  (+) Arthritis ,    Abdominal  (+) + obese  Peds   Hematology  (+) Blood dyscrasia, anemia   Anesthesia Other Findings End Stage Renal Disease  Reproductive/Obstetrics                             Anesthesia Physical Anesthesia Plan  ASA: 4  Anesthesia Plan: General   Post-op Pain Management:    Induction: Intravenous  PONV Risk Score and Plan: 2 and Ondansetron, Dexamethasone and Treatment may vary due to age or medical condition  Airway Management Planned: Oral ETT  Additional Equipment:   Intra-op Plan:   Post-operative Plan: Extubation in OR  Informed Consent: I have reviewed the patients History and Physical, chart, labs and discussed the procedure including the risks, benefits and alternatives for the proposed anesthesia with the patient or authorized representative who has indicated his/her understanding and acceptance.     Dental advisory given  Plan Discussed with: CRNA  Anesthesia Plan Comments: (PAT note written 02/28/2023 by Shonna ChockAllison Zelenak, PA-C.  )       Anesthesia Quick Evaluation

## 2023-02-28 NOTE — Progress Notes (Addendum)
PCP - Dr Kristie Cowman Cardiologist - Rendall, Camelia Eng, PA  at [facility]   Chest x-ray - 12/29/22 (2V) EKG - 01/14/23 Stress Test - n/a ECHO - 11/08/21 Cardiac Cath - n/a  ICD Pacemaker/Loop - n/a  Sleep Study -  Yes  CPAP - occasional use of CPAP, Patient instructed to bring CPAP on DOS.  Patient is on oxygen at home 24/7 - 3L via [place].  Diabetes Type 2 Do not take Humulin R Insulin on the morning of surgery unless your CBG is greater than 220 mg/dL.  If CBG greater than 220 mg/dL, you may take  of your sliding scale (correction) dose of insulin.  Blood Thinner Instructions:  Pt to continue Aspirin & Plavix per MD..  NPO   Anesthesia review: Yes  STOP now taking any Aspirin (unless otherwise instructed by your surgeon), Aleve, Naproxen, Ibuprofen, Motrin, Advil, Goody's, BC's, all herbal medications, fish oil, and all vitamins.   Coronavirus Screening Do you have any of the following symptoms:  Cough yes/no: No Fever (>100.21F)  yes/no: No Runny nose yes/no: No Sore throat yes/no: No Difficulty breathing/shortness of breath  Yes, on oxygen 3L via [place]  Have you traveled in the last 14 days and where? yes/no: No  Patient verbalized understanding of instructions that were given via phone.

## 2023-02-28 NOTE — Progress Notes (Signed)
Patient admitted to [facility] at [facility].  Surgery is scheduled for 03/03/23 at 7:30 AM.

## 2023-02-28 NOTE — Discharge Instructions (Addendum)
I recommend close follow-up with your PCP for reevaluation.  Please do not hesitate to return to emergency department if new or worsening symptoms.

## 2023-02-28 NOTE — ED Notes (Signed)
Pt not in room.

## 2023-02-28 NOTE — Progress Notes (Addendum)
Received patient in bed.Awake ,alert and oriented x 4.   Medicine given: Oxycodone 5mg   Access used : Right upper arm fistula that worked well.  Right arm swelling noted.  Duration of treatment: 3.5 hours.  Fluid removed :2.9 liter  Hemo issue/comment: 100 cc ns bolus given for SBP of  85.Have to hold UF several times to keep a decent blood pressure thus 2.9 UF only.

## 2023-02-28 NOTE — ED Notes (Signed)
Pt's daughter Suzie Portela) aware of pt's discharge and graft procedure on 03-03-2023.

## 2023-02-28 NOTE — Progress Notes (Signed)
Anesthesia Chart Review:  Case: [id number] Date/Time: 03/03/23 0715   Procedures:      LIGATION RIGHT ARM ARTERIOVENOUS GORTEX GRAFT (Right)     INSERTION OF TUNNELED DIALYSIS CATHETER   Anesthesia type: Choice   Pre-op diagnosis: End Stage Renal Disease   Location: [place] [facility] ROOM 11 / [place] [facility]   Surgeons: Waynetta Sandy, MD       DISCUSSION: Patient is a 62 year old male scheduled for the above procedure. He is having RUE pain and swelling with central venous stenosis requiring multiple interventions, most recently s/p stent in right innominate vein 09/05/22, s/p drug coated angioplasty right innominate vin 10/31/22. He previously had a LUE access ligated due to steal syndrome. Dr. Donzetta Matters discussed "ligation of the graft with catheter and consideration of left upper extremity in the future versus attempt to place hero graft through the existing stent."  History includes former smoker (quit 11/28/10), HTN, DM2, CHF, COPD, home O2 (3L [place]), PTSD, ESRD, left AKA (04/03/21 in setting of multiple left knee surgeries including TKA and concern for chronic septic arthritis and osteomyelitis).   He was discharged from his dialysis center in December 2023 due to behavioral issues, so he presents to the [facility] ED frequently to undergo hemodialysis. He mainly is discharged from the ED after his receives HD, but was admitted on 12/29/22-12/31/22 and 11/24/23-12/03/22 because he required additional diuresis and/or consecutive days of hemodialysis for dyspnea/volume excess. Currently, his last ED visit was 02/27/23 overnight for HD on 02/28/23 and also due to worsening RUE pain and swelling with central venous stenosis with plans for ligation of AVGG on 03/03/23.   Last A1c 10.5% on 11/25/22. He in now on Regular insulin U-500 125 mg BID. On 02/28/23, while in ED for HD/RUE pain, DM RN Coordinator advised: "Outpatient Diabetes medications: U-500 125 mg BID Current orders for Inpatient glycemic control: U-500 125 units BID Inpatient  Diabetes Program Recommendations:   Consider reducing U-500 to 75 units BID IF eating meals and adding Novolog 0-9 units TID & HS. "  He is currently listed as ED Roomed. It looks like he is getting HD on 02/27/23 with plans to then discharged. Will plan to follow-up notes in [facility]. Anesthesia team to evaluate on the day of surgery.  Per OR Posting, "Pt to continue Aspirin & Plavix".   VS:  BP Readings from Last 3 Encounters:  02/28/23 (!) 94/40  02/25/23 130/61  02/23/23 103/84   Pulse Readings from Last 3 Encounters:  02/28/23 87  02/25/23 86  02/23/23 83     PROVIDERS: Kristie Cowman, MD is PCP    LABS: For iSTAT on arrival. Currently, most recent labs in [facility] include: Lab Results  Component Value Date   WBC 5.5 02/28/2023   HGB 8.2 (L) 02/28/2023   HCT 25.4 (L) 02/28/2023   PLT 117 (L) 02/28/2023   GLUCOSE 396 (H) 02/28/2023   ALT 16 01/19/2023   AST 15 01/19/2023   NA 133 (L) 02/28/2023   K 3.7 02/28/2023   CL 90 (L) 02/28/2023   CREATININE 5.35 (H) 02/28/2023   BUN 83 (H) 02/28/2023   CO2 29 02/28/2023   INR 1.1 10/19/2022   HGBA1C 10.5 (H) 11/25/2022     IMAGES: 1V PCXR 02/27/23: FINDINGS: No focal consolidation, pleural effusion, or pneumothorax. The cardiac silhouette is within limits. Vascular stent in the upper mediastinum. No acute osseous pathology. Partially visualized right shoulder arthroplasty. IMPRESSION: No active disease.   EKG: 01/14/23:  Sinus rhythm Borderline right  axis deviation Abnormal inferior Q waves No significant change since last tracing Confirmed by Aletta Edouard [phone]) on 01/15/2023 10:32:52 AM   CV: Echo 11/08/21: IMPRESSIONS   1. Left ventricular ejection fraction, by estimation, is 60 to 65%. The  left ventricle has normal function. The left ventricle has no regional  wall motion abnormalities. Left ventricular diastolic parameters were  normal.   2. Right ventricular systolic function is normal. The right  ventricular  size is normal. Tricuspid regurgitation signal is inadequate for assessing  PA pressure.   3. The mitral valve is grossly normal. No evidence of mitral valve  regurgitation. No evidence of mitral stenosis.   4. The aortic valve is tricuspid. There is mild calcification of the  aortic valve. Aortic valve regurgitation is not visualized. Aortic valve  sclerosis/calcification is present, without any evidence of aortic  stenosis.   5. The inferior vena cava is normal in size with greater than 50%  respiratory variability, suggesting right atrial pressure of 3 mmHg.  - Comparison(s): No significant change from prior study.    Past Medical History:  Diagnosis Date   Altered mental status 05/04/2021   Arthritis    Blood transfusion without reported diagnosis    CHF (congestive heart failure) (HCC)    COPD (chronic obstructive pulmonary disease) ([place])    Diabetes mellitus without complication ([place])    ESRD on hemodialysis ([place])    Hypertension    PTSD (post-traumatic stress disorder)    Sepsis ([place])     Past Surgical History:  Procedure Laterality Date   A/V FISTULAGRAM N/A 08/30/2021   Procedure: A/V FISTULAGRAM;  Surgeon: Waynetta Sandy, MD;  Location: [place] [facility];  Service: Cardiovascular;  Laterality: N/A;   A/V FISTULAGRAM Right 07/28/2022   Procedure: A/V Fistulagram;  Surgeon: Waynetta Sandy, MD;  Location: [place] [facility];  Service: Cardiovascular;  Laterality: Right;   A/V FISTULAGRAM Right 09/05/2022   Procedure: A/V Fistulagram;  Surgeon: Waynetta Sandy, MD;  Location: [place] [facility];  Service: Cardiovascular;  Laterality: Right;   A/V FISTULAGRAM Right 10/31/2022   Procedure: A/V Fistulagram;  Surgeon: Waynetta Sandy, MD;  Location: [place] [facility];  Service: Cardiovascular;  Laterality: Right;   A/V SHUNT INTERVENTION Right 10/31/2022   Procedure: A/V SHUNT INTERVENTION;  Surgeon: Waynetta Sandy, MD;  Location: [place] [facility];  Service: Cardiovascular;  Laterality: Right;   A/V SHUNTOGRAM N/A 11/08/2021   Procedure: A/V SHUNTOGRAM;  Surgeon: Waynetta Sandy, MD;  Location: [place] [facility];  Service: Cardiovascular;  Laterality: N/A;   AMPUTATION Left 04/03/2021   Procedure: LEFT ABOVE-THE-KNEE AMPUTATION;  Surgeon: Erle Crocker, MD;  Location: [place];  Service: Orthopedics;  Laterality: Left;   AV FISTULA PLACEMENT Right 08/12/2021   Procedure: INSERTION OF ARTERIOVENOUS (AV) GORE-TEX GRAFT RIGHT ARM;  Surgeon: Serafina Mitchell, MD;  Location: [place];  Service: Vascular;  Laterality: Right;   INSERTION OF DIALYSIS CATHETER N/A 11/25/2022   Procedure: INSERTION OF DIALYSIS CATHETER;  Surgeon: Candee Furbish, MD;  Location: [facility] ENDOSCOPY;  Service: Pulmonary;  Laterality: N/A;   IR REMOVAL TUN CV CATH W/O FL  03/30/2021   JOINT REPLACEMENT     Bilateral knees   PERIPHERAL VASCULAR BALLOON ANGIOPLASTY Right 08/30/2021   Procedure: PERIPHERAL VASCULAR BALLOON ANGIOPLASTY;  Surgeon: Waynetta Sandy, MD;  Location: [place] [facility];  Service: Cardiovascular;  Laterality: Right;   PERIPHERAL VASCULAR BALLOON ANGIOPLASTY Right 07/28/2022  Procedure: PERIPHERAL VASCULAR BALLOON ANGIOPLASTY;  Surgeon: Waynetta Sandy, MD;  Location: [place] [facility];  Service: Cardiovascular;  Laterality: Right;  AVF   PERIPHERAL VASCULAR INTERVENTION  11/08/2021   Procedure: PERIPHERAL VASCULAR INTERVENTION;  Surgeon: Waynetta Sandy, MD;  Location: [place] [facility];  Service: Cardiovascular;;  Central rt arm fistula   UPPER EXTREMITY VENOGRAPHY Right 08/10/2021   Procedure: UPPER EXTREMITY VENOGRAPHY;  Surgeon: Serafina Mitchell, MD;  Location: [place] [facility];  Service: Cardiovascular;  Laterality: Right;    MEDICATIONS: No current facility-administered medications for this encounter.    amLODipine (NORVASC) 10 MG tablet   ASPIRIN LOW DOSE  81 MG EC tablet   atorvastatin (LIPITOR) 40 MG tablet   calcium acetate (PHOSLO) 667 MG capsule   carvedilol (COREG) 12.5 MG tablet   Cholecalciferol (VITAMIN D) 50 MCG (2000 UT) CAPS   clopidogrel (PLAVIX) 75 MG tablet   cyclobenzaprine (FLEXERIL) 10 MG tablet   diazepam (VALIUM) 5 MG tablet   insulin regular human CONCENTRATED (HUMULIN R U-500 KWIKPEN) 500 UNIT/ML KwikPen   metolazone (ZAROXOLYN) 10 MG tablet   nortriptyline (PAMELOR) 10 MG capsule   oxyCODONE (ROXICODONE) 5 MG immediate release tablet   pantoprazole (PROTONIX) 40 MG tablet   pregabalin (LYRICA) 75 MG capsule   sertraline (ZOLOFT) 100 MG tablet   tadalafil (CIALIS) 20 MG tablet   tamsulosin (FLOMAX) 0.4 MG CAPS capsule   torsemide (DEMADEX) 20 MG tablet   traZODone (DESYREL) 150 MG tablet    alteplase (CATHFLO ACTIVASE) injection 2 mg   amLODipine (NORVASC) tablet 10 mg   anticoagulant sodium citrate solution 5 mL   aspirin EC tablet 81 mg   atorvastatin (LIPITOR) tablet 40 mg   calcium acetate (PHOSLO) capsule 1,334 mg   carvedilol (COREG) tablet 12.5 mg   Chlorhexidine Gluconate Cloth 2 % PADS 6 each   clopidogrel (PLAVIX) tablet 75 mg   heparin injection 1,000 Units   insulin regular human CONCENTRATED (HUMULIN R) 500 UNIT/ML KwikPen 125 Units   lidocaine (PF) (XYLOCAINE) 1 % injection 5 mL   lidocaine-prilocaine (EMLA) cream 1 Application   metolazone (ZAROXOLYN) tablet 10 mg   oxyCODONE (Oxy IR/ROXICODONE) immediate release tablet 5 mg   pantoprazole (PROTONIX) EC tablet 40 mg   pentafluoroprop-tetrafluoroeth (GEBAUERS) aerosol 1 Application   pregabalin (LYRICA) capsule 75 mg   sertraline (ZOLOFT) tablet 100 mg   tamsulosin (FLOMAX) capsule 0.4 mg   torsemide (DEMADEX) tablet 120 mg    Myra Gianotti, PA-C Surgical Short Stay/Anesthesiology [facility] Phone [phone] [facility] Phone [phone] 02/28/2023 12:57 PM

## 2023-02-28 NOTE — Procedures (Signed)
We are asked to see this patient for dialysis. Pt was discharged from his OP HD unit in December 2023 due to behavioral issues. Pt is here in ED requesting hospital HD. Pt seen in HD, no c/o's today. Plan is for HD upstairs then return to ED for reassessment and expected discharge home.    Last hep B labs here were on 02/14/23 and 02/27/23     Last OP HD orders from dec 2023:  3:15h  129kg  RUE AVG   Heparin 2000    I was present at this dialysis session, have reviewed the session itself and made  appropriate changes Kelly Splinter MD [place] pager [phone]   02/28/2023, 10:28 AM

## 2023-02-28 NOTE — Inpatient Diabetes Management (Signed)
Inpatient Diabetes Program Recommendations  AACE/ADA: New Consensus Statement on Inpatient Glycemic Control (2015)  Target Ranges:  Prepandial:   less than 140 mg/dL      Peak postprandial:   less than 180 mg/dL (1-2 hours)      Critically ill patients:  140 - 180 mg/dL   Lab Results  Component Value Date   GLUCAP 371 (H) 02/28/2023   HGBA1C 10.5 (H) 11/25/2022    Review of Glycemic Control  Latest Reference Range & Units 02/28/23 02:14  Glucose-Capillary 70 - 99 mg/dL 371 (H)  (H): Data is abnormally high Diabetes history: Type 2 DM Outpatient Diabetes medications: U-500 125 mg BID Current orders for Inpatient glycemic control: U-500 125 units BID  Inpatient Diabetes Program Recommendations:    Consider reducing U-500 to 75 units BID IF eating meals and adding Novolog 0-9 units TID & HS.   Thanks, Bronson Curb, MSN, RNC-OB Diabetes Coordinator [phone] (8a-5p)

## 2023-03-01 LAB — HEPATITIS B SURFACE ANTIBODY, QUANTITATIVE: Hep B S AB Quant (Post): 33.2 m[IU]/mL (ref >9.9)

## 2023-03-02 ENCOUNTER — Emergency Department (HOSPITAL_COMMUNITY): Payer: 59

## 2023-03-02 ENCOUNTER — Other Ambulatory Visit: Payer: Self-pay

## 2023-03-02 ENCOUNTER — Encounter (HOSPITAL_COMMUNITY): Payer: Self-pay

## 2023-03-02 ENCOUNTER — Emergency Department (HOSPITAL_COMMUNITY)
Admission: EM | Admit: 2023-03-02 | Discharge: 2023-03-02 | Discharge status: Against Medical Advice | Payer: 59 | Source: Home / Self Care | Attending: Emergency Medicine | Admitting: Emergency Medicine

## 2023-03-02 DIAGNOSIS — Z79899 Other long term (current) drug therapy: Secondary | ICD-10-CM | POA: Insufficient documentation

## 2023-03-02 DIAGNOSIS — Z4931 Encounter for adequacy testing for hemodialysis: Secondary | ICD-10-CM | POA: Insufficient documentation

## 2023-03-02 DIAGNOSIS — Z89612 Acquired absence of left leg above knee: Secondary | ICD-10-CM | POA: Insufficient documentation

## 2023-03-02 DIAGNOSIS — I132 Hypertensive heart and chronic kidney disease with heart failure and with stage 5 chronic kidney disease, or end stage renal disease: Secondary | ICD-10-CM | POA: Insufficient documentation

## 2023-03-02 DIAGNOSIS — Z794 Long term (current) use of insulin: Secondary | ICD-10-CM | POA: Insufficient documentation

## 2023-03-02 DIAGNOSIS — Z7902 Long term (current) use of antithrombotics/antiplatelets: Secondary | ICD-10-CM | POA: Insufficient documentation

## 2023-03-02 DIAGNOSIS — Z5329 Procedure and treatment not carried out because of patient's decision for other reasons: Secondary | ICD-10-CM | POA: Insufficient documentation

## 2023-03-02 DIAGNOSIS — Z7982 Long term (current) use of aspirin: Secondary | ICD-10-CM | POA: Insufficient documentation

## 2023-03-02 DIAGNOSIS — I509 Heart failure, unspecified: Secondary | ICD-10-CM | POA: Insufficient documentation

## 2023-03-02 DIAGNOSIS — J449 Chronic obstructive pulmonary disease, unspecified: Secondary | ICD-10-CM | POA: Insufficient documentation

## 2023-03-02 DIAGNOSIS — Z992 Dependence on renal dialysis: Secondary | ICD-10-CM | POA: Insufficient documentation

## 2023-03-02 DIAGNOSIS — E1122 Type 2 diabetes mellitus with diabetic chronic kidney disease: Secondary | ICD-10-CM | POA: Insufficient documentation

## 2023-03-02 DIAGNOSIS — N186 End stage renal disease: Secondary | ICD-10-CM | POA: Insufficient documentation

## 2023-03-02 LAB — CBC WITH DIFFERENTIAL/PLATELET
Abs Immature Granulocytes: 0.07 10*3/uL (ref 0.00–0.07)
Basophils Absolute: 0 10*3/uL (ref 0.0–0.1)
Basophils Relative: 0 %
Eosinophils Absolute: 0.1 10*3/uL (ref 0.0–0.5)
Eosinophils Relative: 2 %
HCT: 26.3 % — ABNORMAL LOW (ref 39.0–52.0)
Hemoglobin: 8.3 g/dL — ABNORMAL LOW (ref 13.0–17.0)
Immature Granulocytes: 1 %
Lymphocytes Relative: 12 %
Lymphs Abs: 0.7 10*3/uL (ref 0.7–4.0)
MCH: 28.8 pg (ref 26.0–34.0)
MCHC: 31.6 g/dL (ref 30.0–36.0)
MCV: 91.3 fL (ref 80.0–100.0)
Monocytes Absolute: 0.5 10*3/uL (ref 0.1–1.0)
Monocytes Relative: 7 %
Neutro Abs: 4.7 10*3/uL (ref 1.7–7.7)
Neutrophils Relative %: 78 %
Platelets: 118 10*3/uL — ABNORMAL LOW (ref 150–400)
RBC: 2.88 MIL/uL — ABNORMAL LOW (ref 4.22–5.81)
RDW: 14.2 % (ref 11.5–15.5)
WBC: 6.1 10*3/uL (ref 4.0–10.5)
nRBC: 0 % (ref 0.0–0.2)

## 2023-03-02 LAB — BASIC METABOLIC PANEL
Anion gap: 13 (ref 5–15)
BUN: 71 mg/dL — ABNORMAL HIGH (ref 8–23)
CO2: 29 mmol/L (ref 22–32)
Calcium: 8.6 mg/dL — ABNORMAL LOW (ref 8.9–10.3)
Chloride: 94 mmol/L — ABNORMAL LOW (ref 98–111)
Creatinine, Ser: 5.07 mg/dL — ABNORMAL HIGH (ref 0.61–1.24)
GFR, Estimated: 12 mL/min — ABNORMAL LOW (ref >60)
Glucose, Bld: 127 mg/dL — ABNORMAL HIGH (ref 70–99)
Potassium: 3.4 mmol/L — ABNORMAL LOW (ref 3.5–5.1)
Sodium: 136 mmol/L (ref 135–145)

## 2023-03-02 MED ORDER — CHLORHEXIDINE GLUCONATE CLOTH 2 % EX PADS: 6.0000 | MEDICATED_PAD | Freq: Every day | CUTANEOUS | Status: DC

## 2023-03-02 MED ORDER — HEPARIN SODIUM (PORCINE) 1000 UNIT/ML DIALYSIS
2000.0000 [IU] | Freq: Once | INTRAMUSCULAR | Status: DC
  Filled 2023-03-02: qty 2

## 2023-03-02 NOTE — ED Triage Notes (Signed)
Pt here to get dialysis. Pt denies any complaints

## 2023-03-02 NOTE — ED Provider Notes (Signed)
[place] Provider Note   CSN: [id number] Arrival date & time: 03/02/23  [id number]     History  Chief Complaint  Patient presents with   needs dialysis    Jeffrey [name] is a 62 y.o. male.  Jeffrey [name] is a 62 y.o. male with a history of ESRD on HD, hypertension, diabetes, CHF, COPD, who presents to the ED for dialysis.  Patient was discharged from his outpatient dialysis center and has been presenting to the ED on Tuesdays, Thursdays and Saturdays for routine dialysis.  Last dialyzed 2 days ago and received full treatment without issue.  Reports he has been having pain over his dialysis fistula in the right upper extremity and is scheduled for ligation tomorrow.  Otherwise he feels that he has been at baseline, not requiring more oxygen than usual.  Reports swelling is not worse than usual and he was able to put his shoe on this morning.  Denies any chest pain, vomiting or other complaints.  The history is provided by the patient and medical records.       Home Medications Prior to Admission medications   Medication Sig Start Date End Date Taking? Authorizing Provider  amLODipine (NORVASC) 10 MG tablet Take 10 mg by mouth every morning. 04/17/20   [provider]  ASPIRIN LOW DOSE 81 MG EC tablet Take 81 mg by mouth in the morning. 02/22/21   [provider]  atorvastatin (LIPITOR) 40 MG tablet Take 40 mg by mouth in the morning. 02/09/21   [provider]  calcium acetate (PHOSLO) 667 MG capsule Take 2 capsules (1,334 mg total) by mouth with breakfast, with lunch, and with evening meal. 04/10/21   Debbe Odea, MD  carvedilol (COREG) 12.5 MG tablet Take 12.5 mg by mouth 2 (two) times daily with a meal. 04/17/20   [provider]  Cholecalciferol (VITAMIN D) 50 MCG (2000 UT) CAPS Take 2,000 Units by mouth daily.    [provider]  clopidogrel (PLAVIX) 75 MG tablet Take 1 tablet (75 mg total) by mouth  daily. 11/02/22   Rhyne, Hulen Shouts, PA-C  cyclobenzaprine (FLEXERIL) 10 MG tablet Take 10 mg by mouth 3 (three) times daily as needed for muscle spasms. 12/29/21   [provider]  diazepam (VALIUM) 5 MG tablet Take 5 mg by mouth 2 (two) times daily as needed for muscle spasms. 12/09/21   [provider]  insulin regular human CONCENTRATED (HUMULIN R U-500 KWIKPEN) 500 UNIT/ML KwikPen Inject 45 Units into the skin daily with supper. Patient taking differently: Inject 125 Units into the skin 2 (two) times daily with a meal. 09/04/21 08/30/24  [name], MD  metolazone (ZAROXOLYN) 10 MG tablet Take 10 mg by mouth daily. 07/13/21   [provider]  nortriptyline (PAMELOR) 10 MG capsule Take 20 mg by mouth at bedtime. 10/23/19 08/30/24  [provider]  oxyCODONE (ROXICODONE) 5 MG immediate release tablet Take 1 tablet (5 mg total) by mouth every 8 (eight) hours as needed. Patient taking differently: Take 5 mg by mouth every 8 (eight) hours as needed for moderate pain. 12/31/22 12/31/23  [place] [name], DO  pantoprazole (PROTONIX) 40 MG tablet Take 40 mg by mouth daily before breakfast. 02/09/21   [provider]  pregabalin (LYRICA) 75 MG capsule Take 1 capsule (75 mg total) by mouth See admin instructions. Daily.  Give after dialysis on dialysis days. 01/21/23   [name], MD  sertraline (ZOLOFT) 100 MG tablet Take 100 mg by mouth in the morning. 02/22/21   [provider]  tadalafil (CIALIS) 20 MG tablet Take 20 mg by mouth daily as needed for erectile dysfunction. Patient not taking: Reported on 02/28/2023    [provider]  tamsulosin (FLOMAX) 0.4 MG CAPS capsule Take 1 capsule (0.4 mg total) by mouth daily. 11/10/21   Darliss Cheney, MD  torsemide (DEMADEX) 20 MG tablet Take 120 mg by mouth 2 (two) times daily.    [provider]  traZODone (DESYREL) 150 MG tablet Take 150 mg by mouth at bedtime. 12/09/21   [provider]       Allergies    Actos [pioglitazone], Dexmedetomidine, Ibuprofen, Tomato, and Wellbutrin [bupropion]    Review of Systems   Review of Systems  Constitutional:  Negative for chills and fever.  Respiratory:  Negative for shortness of breath.   Cardiovascular:  Negative for chest pain.    Physical Exam Updated Vital Signs BP (!) 153/60   Pulse (!) 102   Temp 97.8 F (36.6 C) (Oral)   Resp 20   Ht 5\' 5"  (1.651 m)   Wt (!) 144.7 kg   SpO2 90%   BMI 53.09 kg/m  Physical Exam Vitals and nursing note reviewed.  Constitutional:      General: He is not in acute distress.    Appearance: Normal appearance. He is well-developed. He is not diaphoretic.     Comments: Alert, chronically ill-appearing but in no acute distress  HENT:     Head: Normocephalic and atraumatic.  Eyes:     General:        Right eye: No discharge.        Left eye: No discharge.     Pupils: Pupils are equal, round, and reactive to light.  Cardiovascular:     Rate and Rhythm: Normal rate and regular rhythm.     Pulses: Normal pulses.     Heart sounds: Normal heart sounds.  Pulmonary:     Effort: Pulmonary effort is normal. No respiratory distress.     Breath sounds: Normal breath sounds. No wheezing or rales.     Comments: Respirations equal and unlabored, patient able to speak in full sentences, lungs clear to auscultation bilaterally  Abdominal:     General: Bowel sounds are normal. There is no distension.     Palpations: Abdomen is soft. There is no mass.     Tenderness: There is no abdominal tenderness. There is no guarding.     Comments: Abdomen soft, nondistended, nontender to palpation in all quadrants without guarding or peritoneal signs  Musculoskeletal:        General: No deformity.     Cervical back: Neck supple.     Comments: Swelling noted over the right AV fistula mildly tender to palpation, no overlying erythema Left AKA  Skin:    General: Skin is warm and dry.     Capillary Refill:  Capillary refill takes less than 2 seconds.  Neurological:     Mental Status: He is alert and oriented to person, place, and time.     Coordination: Coordination normal.     Comments: Speech is clear, able to follow commands Moves extremities without ataxia, coordination intact  Psychiatric:        Mood and Affect: Mood normal.        Behavior: Behavior normal.     ED Results / Procedures / Treatments   Labs (all labs ordered  are listed, but only abnormal results are displayed) Labs Reviewed  BASIC METABOLIC PANEL - Abnormal; Notable for the following components:      Result Value   Potassium 3.4 (*)    Chloride 94 (*)    Glucose, Bld 127 (*)    BUN 71 (*)    Creatinine, Ser 5.07 (*)    Calcium 8.6 (*)    GFR, Estimated 12 (*)    All other components within normal limits  CBC WITH DIFFERENTIAL/PLATELET - Abnormal; Notable for the following components:   RBC 2.88 (*)    Hemoglobin 8.3 (*)    HCT 26.3 (*)    Platelets 118 (*)    All other components within normal limits    EKG None  Radiology DG Chest 2 View  Result Date: 03/02/2023 CLINICAL DATA:  Shortness of breath.  Needs dialysis. EXAM: CHEST - 2 VIEW COMPARISON:  02/27/2023. FINDINGS: Unchanged vascular stent projecting in the expected location of the right brachiocephalic vein. Pulmonary venous congestion without overt pulmonary edema. No pleural effusion or pneumothorax. Stable cardiac and mediastinal contours. IMPRESSION: Pulmonary venous congestion. Electronically Signed   By: Orvan Falconer M.D.   On: 03/02/2023 08:29    Procedures Procedures    Medications Ordered in ED Medications  Chlorhexidine Gluconate Cloth 2 % PADS 6 each (has no administration in time range)  heparin injection 2,000 Units (has no administration in time range)    ED Course/ Medical Decision Making/ A&P                             Medical Decision Making Amount and/or Complexity of Data Reviewed Labs: ordered. Radiology:  ordered.   62 year old male presents to the ED for routine dialysis after being discharged from his dialysis center.  On arrival vitals are stable on patient's chronic 3 L nasal cannula, mildly hypertensive, no acute complaints.  Scheduled for ligation of right AV fistula tomorrow with Dr. Randie Heinz.   EKG, chest x-ray and basic labs obtained.  Consult placed to nephrology for routine dialysis.  EKG without concerning ischemic changes, normal sinus rhythm.  Chest x-ray reviewed and interpreted independently with pulmonary vascular congestion without overt pulmonary edema  Labs overall reassuring without hyperkalemia.  Case discussed with Dr. Arlean Hopping with nephrology, will plan for dialysis today.  Patient sent back from dialysis because they were not ready for him yet and patient stated that he had to leave her he would not have a ride home.  Patient left AGAINST MEDICAL ADVICE prior to completing dialysis.         Final Clinical Impression(s) / ED Diagnoses Final diagnoses:  Encounter for hemodialysis    Rx / DC Orders ED Discharge Orders     None         Dartha Lodge, [place] 03/02/23 1617    Maia Plan, MD 03/03/23 [phone]

## 2023-03-02 NOTE — Progress Notes (Signed)
Pt came to the unit without being requested from the ED via transport. Told pt and transporter HD wasn't ready and we didn't request for him to come as of yet, due to bay in use. Pt states he will see [place] later. When HD was ready for pt to come pt was already discharged. Ernest Haber, PA aware.

## 2023-03-03 ENCOUNTER — Encounter (HOSPITAL_COMMUNITY)
Admission: RE | Disposition: A | Discharge status: Home / Self Care | Payer: Self-pay | Source: Home / Self Care | Attending: Vascular Surgery

## 2023-03-03 ENCOUNTER — Inpatient Hospital Stay (HOSPITAL_COMMUNITY): Payer: 59 | Admitting: Vascular Surgery

## 2023-03-03 ENCOUNTER — Encounter (HOSPITAL_COMMUNITY): Payer: Self-pay | Admitting: Vascular Surgery

## 2023-03-03 ENCOUNTER — Inpatient Hospital Stay (HOSPITAL_COMMUNITY): Payer: 59

## 2023-03-03 ENCOUNTER — Inpatient Hospital Stay (HOSPITAL_COMMUNITY)
Admission: RE | Admit: 2023-03-03 | Discharge: 2023-03-04 | DRG: 673 | Disposition: A | Discharge status: Home / Self Care | Payer: 59 | Attending: Vascular Surgery | Admitting: Vascular Surgery

## 2023-03-03 ENCOUNTER — Other Ambulatory Visit: Payer: Self-pay

## 2023-03-03 DIAGNOSIS — Z87891 Personal history of nicotine dependence: Secondary | ICD-10-CM | POA: Diagnosis not present

## 2023-03-03 DIAGNOSIS — Z96653 Presence of artificial knee joint, bilateral: Secondary | ICD-10-CM | POA: Diagnosis present

## 2023-03-03 DIAGNOSIS — Z888 Allergy status to other drugs, medicaments and biological substances status: Secondary | ICD-10-CM | POA: Diagnosis not present

## 2023-03-03 DIAGNOSIS — I871 Compression of vein: Secondary | ICD-10-CM | POA: Diagnosis present

## 2023-03-03 DIAGNOSIS — M1990 Unspecified osteoarthritis, unspecified site: Secondary | ICD-10-CM | POA: Diagnosis present

## 2023-03-03 DIAGNOSIS — Z89612 Acquired absence of left leg above knee: Secondary | ICD-10-CM | POA: Diagnosis not present

## 2023-03-03 DIAGNOSIS — Z79899 Other long term (current) drug therapy: Secondary | ICD-10-CM | POA: Diagnosis not present

## 2023-03-03 DIAGNOSIS — D631 Anemia in chronic kidney disease: Secondary | ICD-10-CM | POA: Diagnosis present

## 2023-03-03 DIAGNOSIS — Z7902 Long term (current) use of antithrombotics/antiplatelets: Secondary | ICD-10-CM

## 2023-03-03 DIAGNOSIS — Z6841 Body Mass Index (BMI) 40.0 and over, adult: Secondary | ICD-10-CM

## 2023-03-03 DIAGNOSIS — F4310 Post-traumatic stress disorder, unspecified: Secondary | ICD-10-CM | POA: Diagnosis present

## 2023-03-03 DIAGNOSIS — Z992 Dependence on renal dialysis: Secondary | ICD-10-CM

## 2023-03-03 DIAGNOSIS — I509 Heart failure, unspecified: Secondary | ICD-10-CM

## 2023-03-03 DIAGNOSIS — Z794 Long term (current) use of insulin: Secondary | ICD-10-CM

## 2023-03-03 DIAGNOSIS — Z91018 Allergy to other foods: Secondary | ICD-10-CM

## 2023-03-03 DIAGNOSIS — I120 Hypertensive chronic kidney disease with stage 5 chronic kidney disease or end stage renal disease: Principal | ICD-10-CM | POA: Diagnosis present

## 2023-03-03 DIAGNOSIS — E1122 Type 2 diabetes mellitus with diabetic chronic kidney disease: Secondary | ICD-10-CM | POA: Diagnosis present

## 2023-03-03 DIAGNOSIS — Z886 Allergy status to analgesic agent status: Secondary | ICD-10-CM | POA: Diagnosis not present

## 2023-03-03 DIAGNOSIS — I132 Hypertensive heart and chronic kidney disease with heart failure and with stage 5 chronic kidney disease, or end stage renal disease: Secondary | ICD-10-CM

## 2023-03-03 DIAGNOSIS — Z9981 Dependence on supplemental oxygen: Secondary | ICD-10-CM | POA: Diagnosis not present

## 2023-03-03 DIAGNOSIS — N186 End stage renal disease: Principal | ICD-10-CM | POA: Diagnosis present

## 2023-03-03 DIAGNOSIS — N185 Chronic kidney disease, stage 5: Secondary | ICD-10-CM

## 2023-03-03 DIAGNOSIS — Z96611 Presence of right artificial shoulder joint: Secondary | ICD-10-CM | POA: Diagnosis present

## 2023-03-03 DIAGNOSIS — M7989 Other specified soft tissue disorders: Secondary | ICD-10-CM | POA: Diagnosis present

## 2023-03-03 DIAGNOSIS — J449 Chronic obstructive pulmonary disease, unspecified: Secondary | ICD-10-CM | POA: Diagnosis present

## 2023-03-03 DIAGNOSIS — Z7982 Long term (current) use of aspirin: Secondary | ICD-10-CM | POA: Diagnosis not present

## 2023-03-03 HISTORY — DX: Acute myocardial infarction, unspecified: I21.9

## 2023-03-03 HISTORY — DX: Cardiac murmur, unspecified: R01.1

## 2023-03-03 HISTORY — PX: INSERTION OF DIALYSIS CATHETER: SHX1324

## 2023-03-03 HISTORY — PX: LIGATION ARTERIOVENOUS GORTEX GRAFT: SHX5947

## 2023-03-03 HISTORY — DX: Dependence on wheelchair: Z99.3

## 2023-03-03 HISTORY — DX: Depression, unspecified: F32.A

## 2023-03-03 HISTORY — DX: Dyspnea, unspecified: R06.00

## 2023-03-03 HISTORY — DX: Sleep apnea, unspecified: G47.30

## 2023-03-03 HISTORY — DX: Gastro-esophageal reflux disease without esophagitis: K21.9

## 2023-03-03 LAB — CREATININE, SERUM
Creatinine, Ser: 5.38 mg/dL — ABNORMAL HIGH (ref 0.61–1.24)
GFR, Estimated: 11 mL/min — ABNORMAL LOW (ref >60)

## 2023-03-03 LAB — GLUCOSE, CAPILLARY
Glucose-Capillary: 391 mg/dL — ABNORMAL HIGH (ref 70–99)
Glucose-Capillary: 334 mg/dL — ABNORMAL HIGH (ref 70–99)
Glucose-Capillary: 307 mg/dL — ABNORMAL HIGH (ref 70–99)
Glucose-Capillary: 295 mg/dL — ABNORMAL HIGH (ref 70–99)
Glucose-Capillary: 306 mg/dL — ABNORMAL HIGH (ref 70–99)
Glucose-Capillary: 321 mg/dL — ABNORMAL HIGH (ref 70–99)

## 2023-03-03 LAB — POCT I-STAT, CHEM 8
BUN: 95 mg/dL — ABNORMAL HIGH (ref 8–23)
Calcium, Ion: 1.01 mmol/L — ABNORMAL LOW (ref 1.15–1.40)
Chloride: 89 mmol/L — ABNORMAL LOW (ref 98–111)
Creatinine, Ser: 6.1 mg/dL — ABNORMAL HIGH (ref 0.61–1.24)
Glucose, Bld: 411 mg/dL — ABNORMAL HIGH (ref 70–99)
HCT: 28 % — ABNORMAL LOW (ref 39.0–52.0)
Hemoglobin: 9.5 g/dL — ABNORMAL LOW (ref 13.0–17.0)
Potassium: 3.9 mmol/L (ref 3.5–5.1)
Sodium: 133 mmol/L — ABNORMAL LOW (ref 135–145)
TCO2: 33 mmol/L — ABNORMAL HIGH (ref 22–32)

## 2023-03-03 LAB — CBC
HCT: 26.8 % — ABNORMAL LOW (ref 39.0–52.0)
Hemoglobin: 8.7 g/dL — ABNORMAL LOW (ref 13.0–17.0)
MCH: 29 pg (ref 26.0–34.0)
MCHC: 32.5 g/dL (ref 30.0–36.0)
MCV: 89.3 fL (ref 80.0–100.0)
Platelets: 126 10*3/uL — ABNORMAL LOW (ref 150–400)
RBC: 3 MIL/uL — ABNORMAL LOW (ref 4.22–5.81)
RDW: 14.3 % (ref 11.5–15.5)
WBC: 6.7 10*3/uL (ref 4.0–10.5)
nRBC: 0 % (ref 0.0–0.2)

## 2023-03-03 SURGERY — LIGATION ARTERIOVENOUS GORTEX GRAFT
Anesthesia: General | Laterality: Right

## 2023-03-03 MED ORDER — ACETAMINOPHEN 325 MG PO TABS: 325.0000 mg | ORAL_TABLET | ORAL | Status: DC | PRN

## 2023-03-03 MED ORDER — CARVEDILOL 12.5 MG PO TABS
12.5000 mg | ORAL_TABLET | Freq: Two times a day (BID) | ORAL | Status: DC
  Administered 2023-03-03: 12.5 mg via ORAL
  Filled 2023-03-03: qty 1

## 2023-03-03 MED ORDER — OXYCODONE HCL 5 MG/5ML PO SOLN: 5.0000 mg | Freq: Once | ORAL | Status: DC | PRN

## 2023-03-03 MED ORDER — POLYETHYLENE GLYCOL 3350 17 G PO PACK: 17.0000 g | PACK | Freq: Every day | ORAL | Status: DC | PRN

## 2023-03-03 MED ORDER — KETAMINE HCL 10 MG/ML IJ SOLN
INTRAMUSCULAR | Status: DC | PRN
  Administered 2023-03-03: 30 mg via INTRAVENOUS

## 2023-03-03 MED ORDER — ALBUMIN HUMAN 5 % IV SOLN
12.5000 g | Freq: Once | INTRAVENOUS | Status: AC
  Administered 2023-03-03: 12.5 g via INTRAVENOUS

## 2023-03-03 MED ORDER — ALUM & MAG HYDROXIDE-SIMETH 200-200-20 MG/5ML PO SUSP: 15.0000 mL | ORAL | Status: DC | PRN

## 2023-03-03 MED ORDER — LABETALOL HCL 5 MG/ML IV SOLN: 10.0000 mg | INTRAVENOUS | Status: DC | PRN

## 2023-03-03 MED ORDER — HEPARIN SODIUM (PORCINE) 1000 UNIT/ML IJ SOLN
INTRAMUSCULAR | Status: AC
  Filled 2023-03-03: qty 8

## 2023-03-03 MED ORDER — DIAZEPAM 5 MG PO TABS: 5.0000 mg | ORAL_TABLET | Freq: Two times a day (BID) | ORAL | Status: DC | PRN

## 2023-03-03 MED ORDER — INSULIN ASPART 100 UNIT/ML IJ SOLN
0.0000 [IU] | INTRAMUSCULAR | Status: DC | PRN
  Administered 2023-03-03: 7 [IU] via SUBCUTANEOUS
  Filled 2023-03-03: qty 1

## 2023-03-03 MED ORDER — PHENOL 1.4 % MT LIQD: 1.0000 | OROMUCOSAL | Status: DC | PRN

## 2023-03-03 MED ORDER — PROPOFOL 10 MG/ML IV BOLUS
INTRAVENOUS | Status: DC | PRN
  Administered 2023-03-03: 20 mg via INTRAVENOUS
  Administered 2023-03-03: 120 mg via INTRAVENOUS
  Administered 2023-03-03: 20 mg via INTRAVENOUS

## 2023-03-03 MED ORDER — FENTANYL CITRATE (PF) 250 MCG/5ML IJ SOLN
INTRAMUSCULAR | Status: AC
  Filled 2023-03-03: qty 5

## 2023-03-03 MED ORDER — CHLORHEXIDINE GLUCONATE 4 % EX LIQD: 60.0000 mL | Freq: Once | CUTANEOUS | Status: DC

## 2023-03-03 MED ORDER — TRAZODONE HCL 50 MG PO TABS
150.0000 mg | ORAL_TABLET | Freq: Every day | ORAL | Status: DC
  Administered 2023-03-04: 150 mg via ORAL
  Filled 2023-03-03 (×2): qty 1

## 2023-03-03 MED ORDER — ATORVASTATIN CALCIUM 40 MG PO TABS
40.0000 mg | ORAL_TABLET | Freq: Every morning | ORAL | Status: DC
  Administered 2023-03-04: 40 mg via ORAL
  Filled 2023-03-03: qty 1

## 2023-03-03 MED ORDER — HEPARIN SODIUM (PORCINE) 1000 UNIT/ML IJ SOLN
INTRAMUSCULAR | Status: AC
  Filled 2023-03-03: qty 10

## 2023-03-03 MED ORDER — CALCIUM ACETATE (PHOS BINDER) 667 MG PO CAPS: 1334.0000 mg | ORAL_CAPSULE | Freq: Three times a day (TID) | ORAL | Status: DC

## 2023-03-03 MED ORDER — ASPIRIN 81 MG PO TBEC
81.0000 mg | DELAYED_RELEASE_TABLET | Freq: Every morning | ORAL | Status: DC
  Administered 2023-03-04: 81 mg via ORAL
  Filled 2023-03-03: qty 1

## 2023-03-03 MED ORDER — ACETAMINOPHEN 325 MG RE SUPP: 325.0000 mg | RECTAL | Status: DC | PRN

## 2023-03-03 MED ORDER — VASOPRESSIN 20 UNIT/ML IV SOLN
INTRAVENOUS | Status: AC
  Filled 2023-03-03: qty 1

## 2023-03-03 MED ORDER — SERTRALINE HCL 100 MG PO TABS
100.0000 mg | ORAL_TABLET | Freq: Every morning | ORAL | Status: DC
  Administered 2023-03-04: 100 mg via ORAL
  Filled 2023-03-03: qty 1

## 2023-03-03 MED ORDER — BISACODYL 10 MG RE SUPP: 10.0000 mg | Freq: Every day | RECTAL | Status: DC | PRN

## 2023-03-03 MED ORDER — VASOPRESSIN 20 UNIT/ML IV SOLN
INTRAVENOUS | Status: DC | PRN
  Administered 2023-03-03: 1 [IU] via INTRAVENOUS
  Administered 2023-03-03 (×3): 2 [IU] via INTRAVENOUS
  Administered 2023-03-03: 1 [IU] via INTRAVENOUS
  Administered 2023-03-03 (×3): 2 [IU] via INTRAVENOUS

## 2023-03-03 MED ORDER — CHLORHEXIDINE GLUCONATE 0.12 % MT SOLN: 15.0000 mL | Freq: Once | OROMUCOSAL | Status: AC

## 2023-03-03 MED ORDER — OXYCODONE HCL 5 MG PO TABS: 5.0000 mg | ORAL_TABLET | Freq: Once | ORAL | Status: DC | PRN

## 2023-03-03 MED ORDER — LIDOCAINE-EPINEPHRINE (PF) 1 %-1:200000 IJ SOLN
INTRAMUSCULAR | Status: AC
  Filled 2023-03-03: qty 30

## 2023-03-03 MED ORDER — TORSEMIDE 20 MG PO TABS
120.0000 mg | ORAL_TABLET | Freq: Two times a day (BID) | ORAL | Status: DC
  Administered 2023-03-03: 120 mg via ORAL
  Filled 2023-03-03 (×2): qty 1

## 2023-03-03 MED ORDER — KETAMINE HCL 50 MG/5ML IJ SOSY
PREFILLED_SYRINGE | INTRAMUSCULAR | Status: AC
  Filled 2023-03-03: qty 5

## 2023-03-03 MED ORDER — PANTOPRAZOLE SODIUM 40 MG PO TBEC: 40.0000 mg | DELAYED_RELEASE_TABLET | Freq: Every day | ORAL | Status: DC

## 2023-03-03 MED ORDER — METOLAZONE 5 MG PO TABS: 10.0000 mg | ORAL_TABLET | Freq: Every day | ORAL | Status: DC

## 2023-03-03 MED ORDER — INSULIN REGULAR HUMAN (CONC) 500 UNIT/ML ~~LOC~~ SOPN
125.0000 [IU] | PEN_INJECTOR | Freq: Two times a day (BID) | SUBCUTANEOUS | Status: DC
  Administered 2023-03-03 – 2023-03-04 (×2): 125 [IU] via SUBCUTANEOUS
  Filled 2023-03-03: qty 3

## 2023-03-03 MED ORDER — FENTANYL CITRATE (PF) 100 MCG/2ML IJ SOLN: 25.0000 ug | INTRAMUSCULAR | Status: DC | PRN

## 2023-03-03 MED ORDER — DIPHENHYDRAMINE HCL 50 MG/ML IJ SOLN
INTRAMUSCULAR | Status: AC
  Filled 2023-03-03: qty 1

## 2023-03-03 MED ORDER — 0.9 % SODIUM CHLORIDE (POUR BTL) OPTIME
TOPICAL | Status: DC | PRN
  Administered 2023-03-03: 1000 mL

## 2023-03-03 MED ORDER — ALBUMIN HUMAN 5 % IV SOLN
INTRAVENOUS | Status: AC
  Filled 2023-03-03: qty 250

## 2023-03-03 MED ORDER — PREGABALIN 25 MG PO CAPS: 75.0000 mg | ORAL_CAPSULE | ORAL | Status: DC

## 2023-03-03 MED ORDER — CLOPIDOGREL BISULFATE 75 MG PO TABS: 75.0000 mg | ORAL_TABLET | Freq: Every day | ORAL | Status: DC

## 2023-03-03 MED ORDER — CEFAZOLIN IN SODIUM CHLORIDE 3-0.9 GM/100ML-% IV SOLN
3.0000 g | INTRAVENOUS | Status: AC
  Administered 2023-03-03: 3 g via INTRAVENOUS
  Filled 2023-03-03: qty 100

## 2023-03-03 MED ORDER — LACTATED RINGERS IV SOLN: INTRAVENOUS | Status: DC

## 2023-03-03 MED ORDER — ROCURONIUM BROMIDE 10 MG/ML (PF) SYRINGE
PREFILLED_SYRINGE | INTRAVENOUS | Status: DC | PRN
  Administered 2023-03-03 (×2): 40 mg via INTRAVENOUS

## 2023-03-03 MED ORDER — GUAIFENESIN-DM 100-10 MG/5ML PO SYRP: 15.0000 mL | ORAL_SOLUTION | ORAL | Status: DC | PRN

## 2023-03-03 MED ORDER — HYDRALAZINE HCL 20 MG/ML IJ SOLN: 5.0000 mg | INTRAMUSCULAR | Status: DC | PRN

## 2023-03-03 MED ORDER — LIDOCAINE HCL (PF) 1 % IJ SOLN
INTRAMUSCULAR | Status: AC
  Filled 2023-03-03: qty 30

## 2023-03-03 MED ORDER — ONDANSETRON HCL 4 MG/2ML IJ SOLN: 4.0000 mg | Freq: Four times a day (QID) | INTRAMUSCULAR | Status: DC | PRN

## 2023-03-03 MED ORDER — VITAMIN D 25 MCG (1000 UNIT) PO TABS
2000.0000 [IU] | ORAL_TABLET | Freq: Every day | ORAL | Status: DC
  Filled 2023-03-03: qty 2

## 2023-03-03 MED ORDER — SUGAMMADEX SODIUM 200 MG/2ML IV SOLN
INTRAVENOUS | Status: DC | PRN
  Administered 2023-03-03: 400 mg via INTRAVENOUS

## 2023-03-03 MED ORDER — INSULIN ASPART 100 UNIT/ML IJ SOLN
0.0000 [IU] | INTRAMUSCULAR | Status: DC
  Administered 2023-03-03: 7 [IU] via SUBCUTANEOUS

## 2023-03-03 MED ORDER — HEPARIN SODIUM (PORCINE) 5000 UNIT/ML IJ SOLN
5000.0000 [IU] | Freq: Three times a day (TID) | INTRAMUSCULAR | Status: DC
  Administered 2023-03-04: 5000 [IU] via SUBCUTANEOUS
  Filled 2023-03-03: qty 1

## 2023-03-03 MED ORDER — ACETAMINOPHEN 10 MG/ML IV SOLN: 1000.0000 mg | Freq: Once | INTRAVENOUS | Status: DC | PRN

## 2023-03-03 MED ORDER — EPHEDRINE SULFATE-NACL 50-0.9 MG/10ML-% IV SOSY
PREFILLED_SYRINGE | INTRAVENOUS | Status: DC | PRN
  Administered 2023-03-03: 15 mg via INTRAVENOUS
  Administered 2023-03-03: 10 mg via INTRAVENOUS

## 2023-03-03 MED ORDER — OXYCODONE HCL 5 MG PO TABS
5.0000 mg | ORAL_TABLET | Freq: Three times a day (TID) | ORAL | Status: DC | PRN
  Administered 2023-03-04: 5 mg via ORAL
  Filled 2023-03-03: qty 1

## 2023-03-03 MED ORDER — SUCCINYLCHOLINE CHLORIDE 200 MG/10ML IV SOSY
PREFILLED_SYRINGE | INTRAVENOUS | Status: DC | PRN
  Administered 2023-03-03: 140 mg via INTRAVENOUS

## 2023-03-03 MED ORDER — HYDROMORPHONE HCL 1 MG/ML IJ SOLN: 0.5000 mg | INTRAMUSCULAR | Status: DC | PRN

## 2023-03-03 MED ORDER — ONDANSETRON HCL 4 MG/2ML IJ SOLN
INTRAMUSCULAR | Status: DC | PRN
  Administered 2023-03-03: 4 mg via INTRAVENOUS

## 2023-03-03 MED ORDER — POTASSIUM CHLORIDE CRYS ER 20 MEQ PO TBCR: 20.0000 meq | EXTENDED_RELEASE_TABLET | Freq: Once | ORAL | Status: DC

## 2023-03-03 MED ORDER — ORAL CARE MOUTH RINSE: 15.0000 mL | Freq: Once | OROMUCOSAL | Status: AC

## 2023-03-03 MED ORDER — LIDOCAINE 2% (20 MG/ML) 5 ML SYRINGE
INTRAMUSCULAR | Status: DC | PRN
  Administered 2023-03-03: 60 mg via INTRAVENOUS

## 2023-03-03 MED ORDER — AMLODIPINE BESYLATE 10 MG PO TABS: 10.0000 mg | ORAL_TABLET | Freq: Every morning | ORAL | Status: DC

## 2023-03-03 MED ORDER — CHLORHEXIDINE GLUCONATE CLOTH 2 % EX PADS
6.0000 | MEDICATED_PAD | Freq: Every day | CUTANEOUS | Status: DC
  Administered 2023-03-04: 6 via TOPICAL

## 2023-03-03 MED ORDER — CHLORHEXIDINE GLUCONATE CLOTH 2 % EX PADS: 6.0000 | MEDICATED_PAD | Freq: Every day | CUTANEOUS | Status: DC

## 2023-03-03 MED ORDER — HEPARIN 6000 UNIT IRRIGATION SOLUTION
Status: DC | PRN
  Administered 2023-03-03: 1

## 2023-03-03 MED ORDER — CYCLOBENZAPRINE HCL 10 MG PO TABS: 10.0000 mg | ORAL_TABLET | Freq: Three times a day (TID) | ORAL | Status: DC | PRN

## 2023-03-03 MED ORDER — ALBUMIN HUMAN 5 % IV SOLN: INTRAVENOUS | Status: DC | PRN

## 2023-03-03 MED ORDER — TAMSULOSIN HCL 0.4 MG PO CAPS: 0.4000 mg | ORAL_CAPSULE | Freq: Every day | ORAL | Status: DC

## 2023-03-03 MED ORDER — NORTRIPTYLINE HCL 10 MG PO CAPS
20.0000 mg | ORAL_CAPSULE | Freq: Every day | ORAL | Status: DC
  Administered 2023-03-04: 20 mg via ORAL
  Filled 2023-03-03 (×2): qty 2

## 2023-03-03 MED ORDER — METOPROLOL TARTRATE 5 MG/5ML IV SOLN: 2.0000 mg | INTRAVENOUS | Status: DC | PRN

## 2023-03-03 MED ORDER — FENTANYL CITRATE (PF) 250 MCG/5ML IJ SOLN
INTRAMUSCULAR | Status: DC | PRN
  Administered 2023-03-03: 50 ug via INTRAVENOUS
  Administered 2023-03-03: 100 ug via INTRAVENOUS

## 2023-03-03 MED ORDER — HEPARIN SODIUM (PORCINE) 1000 UNIT/ML IJ SOLN
INTRAMUSCULAR | Status: DC | PRN
  Administered 2023-03-03: 3800 [IU]

## 2023-03-03 MED ORDER — CHLORHEXIDINE GLUCONATE 0.12 % MT SOLN
OROMUCOSAL | Status: AC
  Administered 2023-03-03: 15 mL via OROMUCOSAL
  Filled 2023-03-03: qty 15

## 2023-03-03 MED ORDER — HEPARIN 6000 UNIT IRRIGATION SOLUTION
Status: AC
  Filled 2023-03-03: qty 500

## 2023-03-03 MED ORDER — SODIUM CHLORIDE 0.9 % IV SOLN: INTRAVENOUS | Status: DC

## 2023-03-03 MED ORDER — PROMETHAZINE HCL 25 MG/ML IJ SOLN: 6.2500 mg | INTRAMUSCULAR | Status: DC | PRN

## 2023-03-03 SURGICAL SUPPLY — 61 items
ADH SKN CLS APL DERMABOND .7 (GAUZE/BANDAGES/DRESSINGS) ×2
BAG COUNTER SPONGE SURGICOUNT (BAG) ×2 IMPLANT
BAG DECANTER FOR FLEXI CONT (MISCELLANEOUS) ×2 IMPLANT
BAG SPNG CNTER NS LX DISP (BAG) ×2
BIOPATCH RED 1 DISK 7.0 (GAUZE/BANDAGES/DRESSINGS) ×2 IMPLANT
CANISTER SUCT 3000ML PPV (MISCELLANEOUS) ×2 IMPLANT
CATH PALINDROME-P 19CM W/VT (CATHETERS) IMPLANT
CATH PALINDROME-P 23CM W/VT (CATHETERS) IMPLANT
CATH PALINDROME-P 28CM W/VT (CATHETERS) IMPLANT
CLIP LIGATING EXTRA MED SLVR (CLIP) ×2 IMPLANT
CLIP LIGATING EXTRA SM BLUE (MISCELLANEOUS) ×2 IMPLANT
COVER PROBE W GEL 5X96 (DRAPES) ×2 IMPLANT
COVER SURGICAL LIGHT HANDLE (MISCELLANEOUS) ×2 IMPLANT
DERMABOND ADVANCED .7 DNX12 (GAUZE/BANDAGES/DRESSINGS) ×2 IMPLANT
DRAPE C-ARM 42X72 X-RAY (DRAPES) ×2 IMPLANT
DRAPE CHEST BREAST 15X10 FENES (DRAPES) ×2 IMPLANT
DRSG COVADERM 4X10 (GAUZE/BANDAGES/DRESSINGS) IMPLANT
ELECT REM PT RETURN 9FT ADLT (ELECTROSURGICAL) ×2
ELECTRODE REM PT RTRN 9FT ADLT (ELECTROSURGICAL) ×2 IMPLANT
GAUZE 4X4 16PLY ~~LOC~~+RFID DBL (SPONGE) ×2 IMPLANT
GAUZE SPONGE 4X4 12PLY STRL (GAUZE/BANDAGES/DRESSINGS) ×2 IMPLANT
GLOVE BIO SURGEON STRL SZ7 (GLOVE) ×2 IMPLANT
GLOVE BIO SURGEON STRL SZ7.5 (GLOVE) ×2 IMPLANT
GLOVE BIOGEL PI IND STRL 7.5 (GLOVE) ×2 IMPLANT
GOWN STRL REUS W/ TWL LRG LVL3 (GOWN DISPOSABLE) ×6 IMPLANT
GOWN STRL REUS W/ TWL XL LVL3 (GOWN DISPOSABLE) ×2 IMPLANT
GOWN STRL REUS W/TWL LRG LVL3 (GOWN DISPOSABLE) ×6
GOWN STRL REUS W/TWL XL LVL3 (GOWN DISPOSABLE) ×2
HEMOSTAT SNOW SURGICEL 2X4 (HEMOSTASIS) IMPLANT
KIT BASIN OR (CUSTOM PROCEDURE TRAY) ×2 IMPLANT
KIT PALINDROME-P 55CM (CATHETERS) IMPLANT
KIT TURNOVER KIT B (KITS) ×2 IMPLANT
NDL 18GX1X1/2 (RX/OR ONLY) (NEEDLE) ×2 IMPLANT
NDL HYPO 25GX1X1/2 BEV (NEEDLE) ×2 IMPLANT
NEEDLE 18GX1X1/2 (RX/OR ONLY) (NEEDLE) ×2 IMPLANT
NEEDLE HYPO 25GX1X1/2 BEV (NEEDLE) ×2 IMPLANT
NS IRRIG 1000ML POUR BTL (IV SOLUTION) ×2 IMPLANT
PACK BASIC III (CUSTOM PROCEDURE TRAY) ×2
PACK CV ACCESS (CUSTOM PROCEDURE TRAY) ×2 IMPLANT
PACK SRG BSC III STRL LF ECLPS (CUSTOM PROCEDURE TRAY) ×2 IMPLANT
PAD ARMBOARD 7.5X6 YLW CONV (MISCELLANEOUS) ×4 IMPLANT
POWDER SURGICEL 3.0 GRAM (HEMOSTASIS) IMPLANT
SET MICROPUNCTURE 5F STIFF (MISCELLANEOUS) IMPLANT
SLING ARM FOAM STRAP LRG (SOFTGOODS) IMPLANT
SLING ARM FOAM STRAP MED (SOFTGOODS) IMPLANT
SOAP 2 % CHG 4 OZ (WOUND CARE) ×2 IMPLANT
SUT ETHILON 3 0 PS 1 (SUTURE) ×2 IMPLANT
SUT MNCRL AB 4-0 PS2 18 (SUTURE) ×4 IMPLANT
SUT PROLENE 6 0 BV (SUTURE) IMPLANT
SUT SILK 0 TIES 10X30 (SUTURE) ×2 IMPLANT
SUT VIC AB 3-0 SH 27 (SUTURE) ×2
SUT VIC AB 3-0 SH 27X BRD (SUTURE) ×2 IMPLANT
SYR 10ML LL (SYRINGE) ×2 IMPLANT
SYR 20ML LL LF (SYRINGE) ×4 IMPLANT
SYR 5ML LL (SYRINGE) ×2 IMPLANT
SYR CONTROL 10ML LL (SYRINGE) ×2 IMPLANT
TOWEL GREEN STERILE (TOWEL DISPOSABLE) ×2 IMPLANT
TOWEL GREEN STERILE FF (TOWEL DISPOSABLE) ×4 IMPLANT
UNDERPAD 30X36 HEAVY ABSORB (UNDERPADS AND DIAPERS) ×2 IMPLANT
WATER STERILE IRR 1000ML POUR (IV SOLUTION) ×2 IMPLANT
WIRE BENTSON .035X145CM (WIRE) IMPLANT

## 2023-03-03 NOTE — Anesthesia Postprocedure Evaluation (Signed)
Anesthesia Post Note  Patient: [name]  Procedure(s) Performed: LIGATION RIGHT ARM ARTERIOVENOUS GORTEX GRAFT (Right) INSERTION OF TUNNELED DIALYSIS CATHETER     Patient location during evaluation: PACU Anesthesia Type: General Level of consciousness: awake Pain management: pain level controlled Vital Signs Assessment: post-procedure vital signs reviewed and stable Respiratory status: spontaneous breathing, nonlabored ventilation and respiratory function stable Cardiovascular status: blood pressure returned to baseline and stable Postop Assessment: no apparent nausea or vomiting Anesthetic complications: yes   Encounter Notable Events  Notable Event Outcome Phase Comment  Difficult to intubate - expected  Intraprocedure Filed from anesthesia note documentation.    Last Vitals:  Vitals:   03/03/23 1200 03/03/23 1251  BP:  132/76  Pulse:  81  Resp: 20 16  Temp:  36.4 C  SpO2:  95%    Last Pain:  Vitals:   03/03/23 1251  TempSrc: Oral  PainSc:                  Dewey Neukam P Jadynn Epping

## 2023-03-03 NOTE — Consult Note (Signed)
Renal Service Consult Note [facility]  Jeffrey [name] 03/03/2023 Jeffrey Krabbeobert D Jacqeline Broers, MD Requesting Physician: Dr. Randie Heinzain  Reason for Consult: ESRD pt s/p access procedure HPI: The patient is a 62 y.o. year-old w/ PMH as below who presented to hospital for elective ligation of dialysis AV graft and placement of TDC. This was done this am. Pt has had numerous prior procedures per [facility] on this AVG but due to his persistent arm edema it was decided to go ahead and ligate the graft.  We are asked to see for dialysis.   Pt seen in PACU. Missed his usual hospital dialysis yesterday due to miscommunication between ED and HD. States he thinks he probably has "about 5-6kg on" when asked about his volume status. Pt was discharged from [facility] in December 2023 due to behavioral issues and is now reliant on hospital HD which he has been presenting for on a regular basis. Last HD here was on Tuesday.    ROS - denies CP, no joint pain, no HA, no blurry vision, no rash, no diarrhea, no nausea/ vomiting, no dysuria, no difficulty voiding   Past Medical History  Past Medical History:  Diagnosis Date   Altered mental status 05/04/2021   Arthritis    Blood transfusion without reported diagnosis    CHF (congestive heart failure)    COPD (chronic obstructive pulmonary disease)    Depression    Diabetes mellitus without complication    type 2   Dyspnea    ESRD on hemodialysis    Tues Thurs Sat   GERD (gastroesophageal reflux disease)    protonix   Heart murmur    as a child, no problems   Hypertension    Myocardial infarction    PTSD (post-traumatic stress disorder)    Sepsis    Sleep apnea    occasional uses CPAP   Uses powered wheelchair    Past Surgical History  Past Surgical History:  Procedure Laterality Date   A/V FISTULAGRAM N/A 08/30/2021   Procedure: A/V FISTULAGRAM;  Surgeon: Maeola Harmanain, Brandon Christopher, MD;  [facility];  Service: Cardiovascular;   Laterality: N/A;   A/V FISTULAGRAM Right 07/28/2022   Procedure: A/V Fistulagram;  Surgeon: Maeola Harmanain, Brandon Christopher, MD;  [facility];  Service: Cardiovascular;  Laterality: Right;   A/V FISTULAGRAM Right 09/05/2022   Procedure: A/V Fistulagram;  Surgeon: Maeola Harmanain, Brandon Christopher, MD;  [facility];  Service: Cardiovascular;  Laterality: Right;   A/V FISTULAGRAM Right 10/31/2022   Procedure: A/V Fistulagram;  Surgeon: Maeola Harmanain, Brandon Christopher, MD;  [facility];  Service: Cardiovascular;  Laterality: Right;   A/V SHUNT INTERVENTION Right 10/31/2022   Procedure: A/V SHUNT INTERVENTION;  Surgeon: Maeola Harmanain, Brandon Christopher, MD;  [facility];  Service: Cardiovascular;  Laterality: Right;   A/V SHUNTOGRAM N/A 11/08/2021   Procedure: A/V SHUNTOGRAM;  Surgeon: Maeola Harmanain, Brandon Christopher, MD;  [facility];  Service: Cardiovascular;  Laterality: N/A;   AMPUTATION Left 04/03/2021   Procedure: LEFT ABOVE-THE-KNEE AMPUTATION;  Surgeon: Terance HartAdair, Christopher R, MD;  [facility];  Service: Orthopedics;  Laterality: Left;   AV FISTULA PLACEMENT Right 08/12/2021   Procedure: INSERTION OF ARTERIOVENOUS (AV) GORE-TEX GRAFT RIGHT ARM;  Surgeon: Nada LibmanBrabham, Vance W, MD;  Location: [facility] OR;  Service: Vascular;  Laterality: Right;   INSERTION OF DIALYSIS CATHETER N/A 11/25/2022   Procedure: INSERTION OF DIALYSIS CATHETER;  Surgeon: Lorin GlassSmith, Daniel C, MD;  Location: [facility] ENDOSCOPY;  Service: Pulmonary;  Laterality: N/A;   IR REMOVAL TUN CV CATH W/O FL  03/30/2021   JOINT REPLACEMENT     Bilateral knees   PERIPHERAL VASCULAR BALLOON ANGIOPLASTY Right 08/30/2021   Procedure: PERIPHERAL VASCULAR BALLOON ANGIOPLASTY;  Surgeon: Maeola Harmanain, Brandon Christopher, MD;  [facility];  Service: Cardiovascular;  Laterality: Right;   PERIPHERAL VASCULAR BALLOON ANGIOPLASTY Right 07/28/2022   Procedure: PERIPHERAL VASCULAR BALLOON ANGIOPLASTY;  Surgeon:  Maeola Harmanain, Brandon Christopher, MD;  [facility];  Service: Cardiovascular;  Laterality: Right;  AVF   PERIPHERAL VASCULAR INTERVENTION  11/08/2021   Procedure: PERIPHERAL VASCULAR INTERVENTION;  Surgeon: Maeola Harmanain, Brandon Christopher, MD;  [facility];  Service: Cardiovascular;;  Central rt arm fistula   UPPER EXTREMITY VENOGRAPHY Right 08/10/2021   Procedure: UPPER EXTREMITY VENOGRAPHY;  Surgeon: Nada LibmanBrabham, Vance W, MD;  [facility];  Service: Cardiovascular;  Laterality: Right;   Family History History reviewed. No pertinent family history. Social History  reports that he quit smoking about 12 years ago. His smoking use included cigarettes. He has never used smokeless tobacco. He reports current alcohol use. He reports that he does not currently use drugs. Allergies  Allergies  Allergen Reactions   Actos [Pioglitazone] Anaphylaxis and Rash   Dexmedetomidine Nausea And Vomiting    (Precedex) Dose-limiting bradycardia     Ibuprofen Shortness Of Breath and Swelling    Pt tolerates aspirin    Tomato Anaphylaxis    Only allergic to RAW tomatoes   Wellbutrin [Bupropion] Swelling   Home medications Prior to Admission medications   Medication Sig Start Date End Date Taking? Authorizing Provider  amLODipine (NORVASC) 10 MG tablet Take 10 mg by mouth every morning. 04/17/20  Yes [provider]  ASPIRIN LOW DOSE 81 MG EC tablet Take 81 mg by mouth in the morning. 02/22/21  Yes [provider]  atorvastatin (LIPITOR) 40 MG tablet Take 40 mg by mouth in the morning. 02/09/21  Yes [provider]  calcium acetate (PHOSLO) 667 MG capsule Take 2 capsules (1,334 mg total) by mouth with breakfast, with lunch, and with evening meal. 04/10/21  Yes Calvert Cantorizwan, Saima, MD  carvedilol (COREG) 12.5 MG tablet Take 12.5 mg by mouth 2 (two) times daily with a meal. 04/17/20  Yes [provider]  Cholecalciferol (VITAMIN D) 50 MCG (2000 UT)  CAPS Take 2,000 Units by mouth daily.   Yes [provider]  clopidogrel (PLAVIX) 75 MG tablet Take 1 tablet (75 mg total) by mouth daily. 11/02/22  Yes Rhyne, Ames CoupeSamantha J, PA-C  cyclobenzaprine (FLEXERIL) 10 MG tablet Take 10 mg by mouth 3 (three) times daily as needed for muscle spasms. 12/29/21  Yes [provider]  diazepam (VALIUM) 5 MG tablet Take 5 mg by mouth 2 (two) times daily as needed for muscle spasms. 12/09/21  Yes [provider]  insulin regular human CONCENTRATED (HUMULIN R U-500 KWIKPEN) 500 UNIT/ML KwikPen Inject 45 Units into the skin daily with supper. Patient taking differently: Inject 125 Units into the skin 2 (two) times daily with a meal. 09/04/21 08/30/24 Yes Ogbata, Lafayette DragonSylvester I, MD  metolazone (ZAROXOLYN) 10 MG tablet Take 10 mg by mouth daily. 07/13/21  Yes [provider]  nortriptyline (PAMELOR) 10 MG capsule Take 20 mg by mouth at bedtime. 10/23/19 08/30/24 Yes [provider]  pantoprazole (PROTONIX) 40 MG tablet Take 40 mg by mouth daily before breakfast. 02/09/21  Yes [provider]  pregabalin (LYRICA) 75 MG capsule Take 1 capsule (75 mg total) by mouth See admin instructions. Daily.  Give after dialysis on dialysis days. 01/21/23  Yes [name], MD  sertraline (ZOLOFT) 100 MG tablet Take 100 mg by mouth in the morning. 02/22/21  Yes [provider]  tamsulosin (FLOMAX) 0.4 MG CAPS capsule Take 1 capsule (0.4 mg total) by mouth daily. 11/10/21  Yes Pahwani, Daleen Bo, MD  torsemide (DEMADEX) 20 MG tablet Take 120 mg by mouth 2 (two) times daily.   Yes [provider]  traZODone (DESYREL) 150 MG tablet Take 150 mg by mouth at bedtime. 12/09/21  Yes [provider]  oxyCODONE (ROXICODONE) 5 MG immediate release tablet Take 1 tablet (5 mg total) by mouth every 8 (eight) hours as needed. Patient taking differently: Take 5 mg by mouth every 8 (eight) hours as needed for moderate pain. 12/31/22 12/31/23  [name],  [name], DO  tadalafil (CIALIS) 20 MG tablet Take 20 mg by mouth daily as needed for erectile dysfunction. Patient not taking: Reported on 02/28/2023    [provider]     Vitals:   03/03/23 1045 03/03/23 1100 03/03/23 1115 03/03/23 1130  BP: (!) 84/47 121/74 (!) 127/54 136/61  Pulse: 85 85 83 83  Resp: 12 13 20 20   Temp:    97.6 F (36.4 C)  TempSrc:      SpO2: 95% 96% 95% 96%  Weight:      Height:       Exam Gen alert, no distress, post op in PACU No rash, cyanosis or gangrene Sclera anicteric, throat clear  No jvd or bruits Chest clear bilat to bases, no rales/ wheezing RRR no MRG Abd soft ntnd no mass or ascites +bs marked abd obesity GU normal  MS no joint effusions or deformity Ext sp LLE amputation, 1+ LE edema Neuro is alert, Ox 3 , nf    LIJ TDC (new), RUE AVG no bruit (just ligated)      Home meds include - norvasc, asa, lipitor, phoslo 2 ac tid, coreg 12.5 bid, plavix, valium prn, insulin regular, metolazone 10 qd, pamelor, oxy IR prn, protonix, lyrica, zoloft, cialis prn, flomax, demadex 120 bid, trazodone hs, prns     Last OP HD orders from dec 2023 (patient was discharged from his OP HD unit in December 2023 due to behavioral issues -->  3:15h  129kg  RUE AVG   Heparin 2000  Last hep B labs done here on 02/27/23    Assessment/ Plan: SP ligation of RUE AVG - due to persistent RUE edema. Per [facility].  ESRD - pt dc'd from [facility] for behavior issues in Dec 2023. Has been doing hospital HD on average 3 x per week, but missed 1 HD this week. Plan HD today and tomorrow.  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 Cont binders.  DM2- on insulin HTN - taking coreg, norvasc as outpt. Cont here.       Vinson Moselle  MD [facility] 03/03/2023, 12:13 PM  Recent Labs  Lab 02/25/23 0759 02/27/23 2322 02/28/23 0815 03/02/23 0926 03/03/23 0609  HGB 8.6*   < > 8.2* 8.3* 9.5*  ALBUMIN  3.5  --  3.4*  --   --   CALCIUM 8.8*   < > 8.4* 8.6*  --   PHOS 3.7  --  5.1*  --   --  CREATININE 5.41*   < > 5.35* 5.07* 6.10*  K 3.6   < > 3.7 3.4* 3.9   < > = values in this interval not displayed.   Inpatient medications:  amLODipine  10 mg Oral q morning   aspirin EC  81 mg Oral q AM   atorvastatin  40 mg Oral q AM   calcium acetate  1,334 mg Oral TID with meals   carvedilol  12.5 mg Oral BID WC   clopidogrel  75 mg Oral Daily   heparin  5,000 Units Subcutaneous Q8H   insulin aspart  0-9 Units Subcutaneous Q4H   insulin regular human CONCENTRATED  125 Units Subcutaneous BID WC   metolazone  10 mg Oral Daily   nortriptyline  20 mg Oral QHS   [START ON 03/04/2023] pantoprazole  40 mg Oral QAC breakfast   potassium chloride  20-40 mEq Oral Once   pregabalin  75 mg Oral See admin instructions   sertraline  100 mg Oral q AM   tamsulosin  0.4 mg Oral Daily   torsemide  120 mg Oral BID   traZODone  150 mg Oral QHS   Vitamin D  2,000 Units Oral Daily    acetaminophen     albumin human     albumin human     acetaminophen, acetaminophen **OR** acetaminophen, albumin human, albumin human, alum & mag hydroxide-simeth, bisacodyl, cyclobenzaprine, diazepam, fentaNYL (SUBLIMAZE) injection, guaiFENesin-dextromethorphan, hydrALAZINE, HYDROmorphone (DILAUDID) injection, insulin aspart, labetalol, metoprolol tartrate, ondansetron, oxyCODONE **OR** oxyCODONE, oxyCODONE, phenol, polyethylene glycol, promethazine '

## 2023-03-03 NOTE — Interval H&P Note (Signed)
History and Physical Interval Note:  03/03/2023 7:20 AM  Jeffrey [name]  has presented today for surgery, with the diagnosis of End Stage Renal Disease.  The various methods of treatment have been discussed with the patient and family. After consideration of risks, benefits and other options for treatment, the patient has consented to  Procedure(s): LIGATION RIGHT ARM ARTERIOVENOUS GORTEX GRAFT (Right) INSERTION OF TUNNELED DIALYSIS CATHETER (N/A) as a surgical intervention.  The patient's history has been reviewed, patient examined, no change in status, stable for surgery.  I have reviewed the patient's chart and labs.  Questions were answered to the patient's satisfaction.     Lemar Livings

## 2023-03-03 NOTE — Op Note (Signed)
    Patient name: PAULUS BETKER MRN: [id number] DOB: 04/21/1961 Sex: male  03/03/2023 Pre-operative Diagnosis: End-stage renal disease Post-operative diagnosis:  Same Surgeon:  Apolinar Junes C. Randie Heinz, MD Procedure Performed: 1.  Ultrasound and fluoroscopic guided placement of 23 cm left internal jugular vein tunneled dialysis catheter 2.  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  He has a previous left arm AV fistula which [facility]  We are planning to place a tunneled dialysis catheter and consider repeat left arm access in the future.  Findings: The left IJ was very large but the left innominate vein was somewhat tortuous we were able to get the wire into the IVC and place a left IJ tunneled dialysis catheter using fluoroscopy and ultrasound guidance.  The graft in the right arm was doubly ligated in the mid upper arm where there was minimal scarring.   Procedure:  The patient was identified in the holding area and taken to the operating room where is placed supine operative table and LMA anesthesia was induced.  He was sterilely prepped and draped in the bilateral neck and chest in the right upper extremity usual fashion, antibiotics were ministered a timeout was called.  Ultrasound was used to identify what it was a very large and compressible left internal jugular vein and this was cannulated with a micropuncture needle followed by wire and a sheath.  I then placed a J-wire using fluoroscopic guidance and I was able to get this to turn the corner down into the IVC.  I then tunneled a 23 cm catheter from a counterincision.  The wire tract was serially dilated and introducer sheath was placed under fluoroscopic guidance and  the catheter was then placed to the SVC atrial junction.  This was then affixed to the skin with nylon suture and the neck incision was closed with a 4-0 Monocryl in a figure-of-eight configuration due to bleeding and Dermabond was placed at both sites and the catheter was locked with 1.9 cc of concentrated heparin in both ports.  Attention was then turned to the right upper arm.  Ultrasound was used to identify the graft and where there was minimal scarring or natural skin folds I made a transverse incision across the graft dissected down placed to 2-0 silk ties around the graft and doubly ligated this.  I elected not to transected.  I irrigated the incision hemostasis and closed it with Monocryl over the graft.  Dermabond is placed at the site of the incision.  The patient was awakened from anesthesia having tolerated the procedure without any complication.  All counts were correct at completion.  EBL: 50 cc    Daisha Filosa C. Randie Heinz, MD [facility] Office: [phone] Pager: [phone]

## 2023-03-03 NOTE — Transfer of Care (Signed)
Immediate Anesthesia Transfer of Care Note  Patient: Jeffrey [name]  Procedure(s) Performed: LIGATION RIGHT ARM ARTERIOVENOUS GORTEX GRAFT (Right) INSERTION OF TUNNELED DIALYSIS CATHETER  Patient Location: PACU  Anesthesia Type:General  Level of Consciousness: awake, drowsy, patient cooperative, and responds to stimulation  Airway & Oxygen Therapy: Patient Spontanous Breathing and Patient connected to face mask oxygen  Post-op Assessment: Report given to RN and Post -op Vital signs reviewed and stable  Post vital signs: Reviewed and stable  Last Vitals:  Vitals Value Taken Time  BP 91/37 03/03/23 0945  Temp    Pulse 84 03/03/23 0946  Resp 22 03/03/23 0946  SpO2 96 % 03/03/23 0946  Vitals shown include unvalidated device data.  Last Pain:  Vitals:   03/03/23 0623  TempSrc:   PainSc: 0-No pain         Complications:  Encounter Notable Events  Notable Event Outcome Phase Comment  Difficult to intubate - expected  Intraprocedure Filed from anesthesia note documentation.

## 2023-03-03 NOTE — Anesthesia Procedure Notes (Signed)
Procedure Name: Intubation Date/Time: 03/03/2023 8:08 AM  Performed by: Stanton Kidney, CRNAPre-anesthesia Checklist: Patient identified, Patient being monitored, Timeout performed, Emergency Drugs available and Suction available Patient Re-evaluated:Patient Re-evaluated prior to induction Oxygen Delivery Method: Circle system utilized Preoxygenation: Pre-oxygenation with 100% oxygen Induction Type: IV induction Laryngoscope Size: Glidescope and 4 Grade View: Grade I Tube type: Oral Tube size: 7.5 mm Number of attempts: 1 Airway Equipment and Method: Stylet Placement Confirmation: ETT inserted through vocal cords under direct vision, positive ETCO2 and breath sounds checked- equal and bilateral Secured at: 23 cm Tube secured with: Tape Dental Injury: Teeth and Oropharynx as per pre-operative assessment  Difficulty Due To: Difficulty was anticipated Comments: Cords easily viewed with GlideGo 4, in sniffing position, extremely  large tongue and tonsils, mask ventilation not attempted

## 2023-03-04 ENCOUNTER — Encounter (HOSPITAL_COMMUNITY): Payer: 59

## 2023-03-04 ENCOUNTER — Encounter (HOSPITAL_COMMUNITY): Payer: Self-pay | Admitting: Vascular Surgery

## 2023-03-04 LAB — BASIC METABOLIC PANEL
Anion gap: 15 (ref 5–15)
BUN: 42 mg/dL — ABNORMAL HIGH (ref 8–23)
CO2: 27 mmol/L (ref 22–32)
Calcium: 8 mg/dL — ABNORMAL LOW (ref 8.9–10.3)
Chloride: 94 mmol/L — ABNORMAL LOW (ref 98–111)
Creatinine, Ser: 4.01 mg/dL — ABNORMAL HIGH (ref 0.61–1.24)
GFR, Estimated: 16 mL/min — ABNORMAL LOW (ref >60)
Glucose, Bld: 113 mg/dL — ABNORMAL HIGH (ref 70–99)
Potassium: 3.8 mmol/L (ref 3.5–5.1)
Sodium: 136 mmol/L (ref 135–145)

## 2023-03-04 LAB — CBC
HCT: 27.4 % — ABNORMAL LOW (ref 39.0–52.0)
Hemoglobin: 8.6 g/dL — ABNORMAL LOW (ref 13.0–17.0)
MCH: 28.6 pg (ref 26.0–34.0)
MCHC: 31.4 g/dL (ref 30.0–36.0)
MCV: 91 fL (ref 80.0–100.0)
Platelets: 107 10*3/uL — ABNORMAL LOW (ref 150–400)
RBC: 3.01 MIL/uL — ABNORMAL LOW (ref 4.22–5.81)
RDW: 14.1 % (ref 11.5–15.5)
WBC: 5.7 10*3/uL (ref 4.0–10.5)
nRBC: 0 % (ref 0.0–0.2)

## 2023-03-04 LAB — GLUCOSE, CAPILLARY
Glucose-Capillary: 113 mg/dL — ABNORMAL HIGH (ref 70–99)
Glucose-Capillary: 239 mg/dL — ABNORMAL HIGH (ref 70–99)

## 2023-03-04 MED ORDER — HEPARIN SODIUM (PORCINE) 1000 UNIT/ML DIALYSIS: 2000.0000 [IU] | Freq: Once | INTRAMUSCULAR | Status: DC

## 2023-03-04 MED ORDER — HEPARIN SODIUM (PORCINE) 1000 UNIT/ML IJ SOLN
INTRAMUSCULAR | Status: AC
  Administered 2023-03-04: 3800 [IU]
  Filled 2023-03-04: qty 4

## 2023-03-04 MED ORDER — HEPARIN SODIUM (PORCINE) 1000 UNIT/ML DIALYSIS
2000.0000 [IU] | Freq: Once | INTRAMUSCULAR | Status: AC
  Administered 2023-03-04: 2000 [IU] via INTRAVENOUS_CENTRAL
  Filled 2023-03-04: qty 2

## 2023-03-04 NOTE — Discharge Instructions (Signed)
   [facility]  Discharge Instructions  AV Fistula or Graft Surgery for Dialysis Access  Please refer to the following instructions for your post-procedure care. Your surgeon or physician assistant will discuss any changes with you.  Activity  You may drive the day following your surgery, if you are comfortable and no longer taking prescription pain medication. Resume full activity as the soreness in your incision resolves.  Bathing/Showering  You may shower after you go home. Keep your incision dry for 48 hours. Do not soak in a bathtub, hot tub, or swim until the incision heals completely. You may not shower if you have a hemodialysis catheter.  Incision Care  Clean your incision with mild soap and water after 48 hours. Pat the area dry with a clean towel. You do not need a bandage unless otherwise instructed. Do not apply any ointments or creams to your incision. You may have skin glue on your incision. Do not peel it off. It will come off on its own in about one week. Your arm may swell a bit after surgery. To reduce swelling use pillows to elevate your arm so it is above your heart. Your doctor will tell you if you need to lightly wrap your arm with an ACE bandage.  Diet  Resume your normal diet. There are not special food restrictions following this procedure. In order to heal from your surgery, it is CRITICAL to get adequate nutrition. Your body requires vitamins, minerals, and protein. Vegetables are the best source of vitamins and minerals. Vegetables also provide the perfect balance of protein. Processed food has little nutritional value, so try to avoid this.  Medications  Resume taking all of your medications. If your incision is causing pain, you may take over-the counter pain relievers such as acetaminophen (Tylenol). If you were prescribed a stronger pain medication, please be aware these medications can cause nausea and constipation. Prevent  nausea by taking the medication with a snack or meal. Avoid constipation by drinking plenty of fluids and eating foods with high amount of fiber, such as fruits, vegetables, and grains.  Do not take Tylenol if you are taking prescription pain medications.  Please call us immediately for any of the following conditions:  Increased pain, redness, drainage (pus) from your incision site Fever of 101 degrees or higher Severe or worsening pain at your incision site Hand pain or numbness.  Reduce your risk of vascular disease:  Stop smoking. If you would like help, call [facility] at 1-800-QUIT-NOW (386 638 2508) or [place] at (757)306-9819  Manage your cholesterol Maintain a desired weight Control your diabetes Keep your blood pressure down  Dialysis  Continue to use your PermCath for dialysis access.   03/04/2023 Jeffrey [name] [id number] 21-Feb-1961  Surgeon(s): Maeola Harman, MD  Procedure(s): LIGATION RIGHT ARM ARTERIOVENOUS GORTEX GRAFT INSERTION OF TUNNELED DIALYSIS CATHETER   If you have any questions, please call the office at [phone].

## 2023-03-04 NOTE — Progress Notes (Signed)
   03/04/23 1426  Vitals  Temp 98.1 F (36.7 C)  Pulse Rate 87  Resp 16  BP (!) 133/58  SpO2 99 %  O2 Device Nasal Cannula  Weight  (not able to weigh)  Type of Weight Post-Dialysis  Oxygen Therapy  O2 Flow Rate (L/min) 3 L/min  Patient Activity (if Appropriate) In bed  Pulse Oximetry Type Continuous  Oximetry Probe Site Changed No  Post Treatment  Dialyzer Clearance Lightly streaked  Duration of HD Treatment -hour(s) 3.15 hour(s)  Hemodialysis Intake (mL) 0 mL  Liters Processed 78  Fluid Removed (mL) 3000 mL  Tolerated HD Treatment Yes   Received patient in bed to unit.  Alert and oriented.  Informed consent signed and in chart.   TX duration:3.15  Patient tolerated well.  Transported back to the room  Alert, without acute distress.  Hand-off given to patient's nurse.   Access used: [facility] Access issues: lines reversed at the onset of the tx--  Total UF removed: 3000 Medication(s) given: none   [facility]

## 2023-03-04 NOTE — Progress Notes (Signed)
[facility] Progress Note  Subjective: seen in room, 3 L off w/ HD yest, no c/os' today.   Vitals:   03/04/23 1108 03/04/23 1130 03/04/23 1200 03/04/23 1230  BP: (!) 136/52 (!) 127/57 126/67 (!) 134/44  Pulse: 98 88 88 87  Resp: 16 20 14 10   Temp:      TempSrc:      SpO2: 99% 98% 99% 100%  Weight:      Height:        Exam: Gen alert, no distress No jvd or bruits Chest clear bilat to bases RRR no MRG Abd soft ntnd no mass or ascites +bs marked abd obesity Ext sp LLE amputation, 1+ LE edema Neuro is alert, Ox 3 , nf    LIJ TDC (new), RUE AVG no bruit (just ligated)          Home meds include - norvasc, asa, lipitor, phoslo 2 ac tid, coreg 12.5 bid, plavix, valium prn, insulin regular, metolazone 10 qd, pamelor, oxy IR prn, protonix, lyrica, zoloft, cialis prn, flomax, demadex 120 bid, trazodone hs, prns      Last OP HD orders from dec 2023 (patient was discharged from his OP HD unit in December 2023 due to behavioral issues) -->  3:15h  129kg  RUE AVG   Heparin 2000  Last hep B labs done here on 02/27/23     Assessment/ Plan: SP ligation of RUE AVG - due to persistent RUE edema. Per [facility].  ESRD - pt dc'd from [facility] practice for behavior issues in Dec 2023. Has been doing hospital HD on average 3 x per week, but missed 1 HD this week. Had HD yest w/ 3L off and will plan the same today prior to dc home.  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 Cont binders.  DM2- on insulin HTN - taking coreg, norvasc as outpt. Cont Dispo - for dc after HD today.     Vinson Moselle MD [facility] 03/04/2023, 1:31 PM  Recent Labs  Lab 02/28/23 0815 03/02/23 0926 03/03/23 0609 03/03/23 1749 03/03/23 2008 03/04/23 0733  HGB 8.2* 8.3* 9.5*  --  8.7* 8.6*  ALBUMIN 3.4*  --   --   --   --   --   CALCIUM 8.4* 8.6*  --   --   --  8.0*  PHOS 5.1*  --   --   --   --   --   CREATININE 5.35* 5.07* 6.10* 5.38*  --  4.01*  K  3.7 3.4* 3.9  --   --  3.8   No results for input(s): "IRON", "TIBC", "FERRITIN" in the last 168 hours. Inpatient medications:  amLODipine  10 mg Oral q morning   aspirin EC  81 mg Oral q AM   atorvastatin  40 mg Oral q AM   calcium acetate  1,334 mg Oral TID with meals   carvedilol  12.5 mg Oral BID WC   Chlorhexidine Gluconate Cloth  6 each Topical Q0600   Chlorhexidine Gluconate Cloth  6 each Topical Q0600   cholecalciferol  2,000 Units Oral Daily   clopidogrel  75 mg Oral Daily   [START ON 03/05/2023] heparin  2,000 Units Dialysis Once in dialysis   heparin  5,000 Units Subcutaneous Q8H   heparin sodium (porcine)       insulin regular human CONCENTRATED  125 Units Subcutaneous BID WC  metolazone  10 mg Oral Daily   nortriptyline  20 mg Oral QHS   pantoprazole  40 mg Oral QAC breakfast   potassium chloride  20-40 mEq Oral Once   pregabalin  75 mg Oral Q T,Th,Sa-HD   sertraline  100 mg Oral q AM   tamsulosin  0.4 mg Oral Daily   torsemide  120 mg Oral BID   traZODone  150 mg Oral QHS    acetaminophen **OR** acetaminophen, alum & mag hydroxide-simeth, bisacodyl, cyclobenzaprine, diazepam, guaiFENesin-dextromethorphan, heparin sodium (porcine), hydrALAZINE, HYDROmorphone (DILAUDID) injection, labetalol, metoprolol tartrate, ondansetron, oxyCODONE, phenol, polyethylene glycol

## 2023-03-04 NOTE — Progress Notes (Addendum)
  Progress Note    03/04/2023 8:22 AM 1 Day Post-Op  Subjective:  no complaints. May get dialysis this morning before discharge    Vitals:   03/04/23 0247 03/04/23 0737  BP: (!) 142/72 131/70  Pulse: 82 88  Resp: 16 18  Temp: 98.4 F (36.9 C) 98.4 F (36.9 C)  SpO2: 98% 96%    Physical Exam: General:  resting comfortably Cardiac:  regular Lungs:  nonlabored Incisions:  RUE incision dry and intact  CBC    Component Value Date/Time   WBC 6.7 03/03/2023 2008   RBC 3.00 (L) 03/03/2023 2008   HGB 8.7 (L) 03/03/2023 2008   HCT 26.8 (L) 03/03/2023 2008   PLT 126 (L) 03/03/2023 2008   MCV 89.3 03/03/2023 2008   MCH 29.0 03/03/2023 2008   MCHC 32.5 03/03/2023 2008   RDW 14.3 03/03/2023 2008   LYMPHSABS 0.7 03/02/2023 0926   MONOABS 0.5 03/02/2023 0926   EOSABS 0.1 03/02/2023 0926   BASOSABS 0.0 03/02/2023 0926    BMET    Component Value Date/Time   NA 133 (L) 03/03/2023 0609   K 3.9 03/03/2023 0609   CL 89 (L) 03/03/2023 0609   CO2 29 03/02/2023 0926   GLUCOSE 411 (H) 03/03/2023 0609   BUN 95 (H) 03/03/2023 0609   CREATININE 5.38 (H) 03/03/2023 1749   CALCIUM 8.6 (L) 03/02/2023 0926   GFRNONAA 11 (L) 03/03/2023 1749    INR    Component Value Date/Time   INR 1.1 10/19/2022 2253     Intake/Output Summary (Last 24 hours) at 03/04/2023 3532 Last data filed at 03/04/2023 0300 Gross per 24 hour  Intake 870 ml  Output 3320 ml  Net -2450 ml      Assessment/Plan:  62 y.o. male is 1 day post op, s/p: ligation of RUE AVG and placement of left IJ TDC   -The patient's right hand is feeling better after ligation of right UE AVG. He has a brisk right radial doppler signal -RUE incision is dry and intact. Left IJ TDC in place without bleeding issues, this was used for dialysis last night -The patient may be going for 1 more dialysis session today prior to discharge. I will place discharge orders for the patient so he can go home after possible dialysis -I will  arrange follow up with Dr.Cain in 1 month with LUE vein mapping for possible access discussion   Loel Dubonnet, PA-C [facility] [phone] 03/04/2023 8:22 AM   I have interviewed the patient and examined the patient. I agree with the findings by the PA.  The incision looks fine in the right arm.  He has a functioning catheter.  He finished dialysis about midnight last night.  He is not going to be dialyzed again today he can be discharged.  Otherwise he can be discharged after dialysis.  Cari Caraway, MD

## 2023-03-04 NOTE — Progress Notes (Signed)
Patient given discharge instructions, medication list and follow up appointments. IV was removed. Will discharge home as ordered. Braelyn Jenson, Randall An RN

## 2023-03-04 NOTE — Progress Notes (Signed)
Received patient in bed to unit.  Alert and oriented.  Informed consent signed and in chart.   TX duration:3.15  Patient tolerated well.  Transported back to the room  Alert, without acute distress.  Hand-off given to patient's nurse.   Access used: catheter Access issues: none  Total UF removed: 3000 mls Medication(s) given: none Post HD VS: 136/74    03/04/23 0200  Vitals  Temp 98.2 F (36.8 C)  Temp Source Oral  BP 136/74  MAP (mmHg) 93  BP Location Right Leg  BP Method Automatic  Patient Position (if appropriate) Lying  Pulse Rate 81  Pulse Rate Source Monitor  ECG Heart Rate 81  Resp 17  Oxygen Therapy  SpO2 98 %  O2 Device Nasal Cannula  O2 Flow Rate (L/min) 3 L/min  Patient Activity (if Appropriate) In bed  Pulse Oximetry Type Continuous  Oximetry Probe Site Changed No  Post Treatment  Dialyzer Clearance Lightly streaked  Duration of HD Treatment -hour(s) 3.15 hour(s)  Hemodialysis Intake (mL) 0 mL  Liters Processed 78  Fluid Removed (mL) 3000 mL  Tolerated HD Treatment Yes  Post-Hemodialysis Comments hd TX ACHIEVED AS EXPECTED WITHOUT COMPLICATIONS, TOLERATED WELL, UF GOAL REACHED, NO COMPLAINTS.  AVG/AVF Arterial Site Held (minutes) 0 minutes  AVG/AVF Venous Site Held (minutes) 0 minutes  Note  Observations pt is in bed resting, alert, oriented, verbally rsponsive, and stable.  Hemodialysis Catheter Left Internal jugular Double lumen Permanent (Tunneled)  Placement Date/Time: 03/03/23 0856   Serial / Lot #: [id number]  Expiration Date: 08/30/27  Time Out: Correct patient;Correct site;Correct procedure  Maximum sterile barrier precautions: Hand hygiene;Cap;Mask;Sterile gown;Sterile gloves;Large sterile ...  Site Condition No complications  Blue Lumen Status Flushed;Heparin locked;Dead end cap in place  Red Lumen Status Flushed;Heparin locked;Dead end cap in place  Purple Lumen Status N/A  Catheter fill solution Heparin 1000 units/ml  Catheter fill  volume (Arterial) 1.9 cc  Catheter fill volume (Venous) 1.9  Dressing Type Gauze/Drain sponge  Dressing Status Clean, Dry, Intact  Drainage Description None  Dressing Change Due 03/04/23  Post treatment catheter status Capped and Clamped

## 2023-03-07 ENCOUNTER — Emergency Department (HOSPITAL_COMMUNITY)
Admission: EM | Admit: 2023-03-07 | Discharge: 2023-03-07 | Disposition: A | Discharge status: Home / Self Care | Payer: 59 | Attending: Emergency Medicine | Admitting: Emergency Medicine

## 2023-03-07 ENCOUNTER — Encounter (HOSPITAL_COMMUNITY): Payer: Self-pay

## 2023-03-07 DIAGNOSIS — Z7982 Long term (current) use of aspirin: Secondary | ICD-10-CM | POA: Diagnosis not present

## 2023-03-07 DIAGNOSIS — Z7902 Long term (current) use of antithrombotics/antiplatelets: Secondary | ICD-10-CM | POA: Diagnosis not present

## 2023-03-07 DIAGNOSIS — N186 End stage renal disease: Secondary | ICD-10-CM | POA: Diagnosis not present

## 2023-03-07 DIAGNOSIS — Z992 Dependence on renal dialysis: Secondary | ICD-10-CM | POA: Diagnosis not present

## 2023-03-07 DIAGNOSIS — Z794 Long term (current) use of insulin: Secondary | ICD-10-CM | POA: Insufficient documentation

## 2023-03-07 DIAGNOSIS — Z4931 Encounter for adequacy testing for hemodialysis: Secondary | ICD-10-CM | POA: Diagnosis present

## 2023-03-07 LAB — CBC
HCT: 25.4 % — ABNORMAL LOW (ref 39.0–52.0)
Hemoglobin: 7.9 g/dL — ABNORMAL LOW (ref 13.0–17.0)
MCH: 28.3 pg (ref 26.0–34.0)
MCHC: 31.1 g/dL (ref 30.0–36.0)
MCV: 91 fL (ref 80.0–100.0)
Platelets: 115 10*3/uL — ABNORMAL LOW (ref 150–400)
RBC: 2.79 MIL/uL — ABNORMAL LOW (ref 4.22–5.81)
RDW: 13.9 % (ref 11.5–15.5)
WBC: 6.8 10*3/uL (ref 4.0–10.5)
nRBC: 0 % (ref 0.0–0.2)

## 2023-03-07 LAB — RENAL FUNCTION PANEL
Albumin: 3.5 g/dL (ref 3.5–5.0)
Anion gap: 12 (ref 5–15)
BUN: 73 mg/dL — ABNORMAL HIGH (ref 8–23)
CO2: 25 mmol/L (ref 22–32)
Calcium: 8.2 mg/dL — ABNORMAL LOW (ref 8.9–10.3)
Chloride: 94 mmol/L — ABNORMAL LOW (ref 98–111)
Creatinine, Ser: 6.52 mg/dL — ABNORMAL HIGH (ref 0.61–1.24)
GFR, Estimated: 9 mL/min — ABNORMAL LOW (ref >60)
Glucose, Bld: 173 mg/dL — ABNORMAL HIGH (ref 70–99)
Phosphorus: 4.8 mg/dL — ABNORMAL HIGH (ref 2.5–4.6)
Potassium: 3 mmol/L — ABNORMAL LOW (ref 3.5–5.1)
Sodium: 131 mmol/L — ABNORMAL LOW (ref 135–145)

## 2023-03-07 MED ORDER — ANTICOAGULANT SODIUM CITRATE 4% (200MG/5ML) IV SOLN: 5.0000 mL | Status: DC | PRN

## 2023-03-07 MED ORDER — HEPARIN SODIUM (PORCINE) 1000 UNIT/ML DIALYSIS
1000.0000 [IU] | INTRAMUSCULAR | Status: DC | PRN
  Administered 2023-03-07: 1000 [IU]
  Filled 2023-03-07: qty 1

## 2023-03-07 MED ORDER — HEPARIN SODIUM (PORCINE) 1000 UNIT/ML DIALYSIS
5000.0000 [IU] | Freq: Once | INTRAMUSCULAR | Status: DC
  Filled 2023-03-07 (×2): qty 5

## 2023-03-07 MED ORDER — ALTEPLASE 2 MG IJ SOLR: 2.0000 mg | Freq: Once | INTRAMUSCULAR | Status: DC | PRN

## 2023-03-07 MED ORDER — HEPARIN SODIUM (PORCINE) 1000 UNIT/ML DIALYSIS: 20.0000 [IU]/kg | INTRAMUSCULAR | Status: DC | PRN

## 2023-03-07 NOTE — ED Triage Notes (Signed)
[facility] dialysis. Needs dialysis today.

## 2023-03-07 NOTE — ED Provider Notes (Signed)
[place] EMERGENCY DEPARTMENT AT [facility] Provider Note   CSN: [id number] Arrival date & time: 03/07/23  2162     History  Chief Complaint  Patient presents with   Vascular Access Problem    Jeffrey [name] is a 62 y.o. male.  HPI Patient seen 3 days after ligation of right AV HD access with placement of left tunneled port now with need for dialysis.  He states that he is otherwise doing generally well, swelling in his arm is reduced, he has no interval complaints such as fever.    Home Medications Prior to Admission medications   Medication Sig Start Date End Date Taking? Authorizing Provider  amLODipine (NORVASC) 10 MG tablet Take 10 mg by mouth every morning. 04/17/20   [provider]  ASPIRIN LOW DOSE 81 MG EC tablet Take 81 mg by mouth in the morning. 02/22/21   [provider]  atorvastatin (LIPITOR) 40 MG tablet Take 40 mg by mouth in the morning. 02/09/21   [provider]  calcium acetate (PHOSLO) 667 MG capsule Take 2 capsules (1,334 mg total) by mouth with breakfast, with lunch, and with evening meal. 04/10/21   Calvert Cantor, MD  carvedilol (COREG) 12.5 MG tablet Take 12.5 mg by mouth 2 (two) times daily with a meal. 04/17/20   [provider]  Cholecalciferol (VITAMIN D) 50 MCG (2000 UT) CAPS Take 2,000 Units by mouth daily.    [provider]  clopidogrel (PLAVIX) 75 MG tablet Take 1 tablet (75 mg total) by mouth daily. 11/02/22   Rhyne, Ames Coupe, PA-C  cyclobenzaprine (FLEXERIL) 10 MG tablet Take 10 mg by mouth 3 (three) times daily as needed for muscle spasms. 12/29/21   [provider]  diazepam (VALIUM) 5 MG tablet Take 5 mg by mouth 2 (two) times daily as needed for muscle spasms. 12/09/21   [provider]  insulin regular human CONCENTRATED (HUMULIN R U-500 KWIKPEN) 500 UNIT/ML KwikPen Inject 45 Units into the skin daily with supper. Patient taking differently: Inject 125 Units into the skin 2  (two) times daily with a meal. 09/04/21 08/30/24  [name], MD  metolazone (ZAROXOLYN) 10 MG tablet Take 10 mg by mouth daily. 07/13/21   [provider]  nortriptyline (PAMELOR) 10 MG capsule Take 20 mg by mouth at bedtime. 10/23/19 08/30/24  [provider]  oxyCODONE (ROXICODONE) 5 MG immediate release tablet Take 1 tablet (5 mg total) by mouth every 8 (eight) hours as needed. Patient taking differently: Take 5 mg by mouth every 8 (eight) hours as needed for moderate pain. 12/31/22 12/31/23  [name], [name], DO  pantoprazole (PROTONIX) 40 MG tablet Take 40 mg by mouth daily before breakfast. 02/09/21   [provider]  pregabalin (LYRICA) 75 MG capsule Take 1 capsule (75 mg total) by mouth See admin instructions. Daily.  Give after dialysis on dialysis days. 01/21/23   [name], MD  sertraline (ZOLOFT) 100 MG tablet Take 100 mg by mouth in the morning. 02/22/21   [provider]  tadalafil (CIALIS) 20 MG tablet Take 20 mg by mouth daily as needed for erectile dysfunction. Patient not taking: Reported on 02/28/2023    [provider]  tamsulosin (FLOMAX) 0.4 MG CAPS capsule Take 1 capsule (0.4 mg total) by mouth daily. 11/10/21   [name], MD  torsemide (DEMADEX) 20 MG tablet Take 120 mg by mouth 2 (two) times daily.    [provider]  traZODone (DESYREL)  150 MG tablet Take 150 mg by mouth at bedtime. 12/09/21   [provider]      Allergies    Actos [pioglitazone], Dexmedetomidine, Ibuprofen, Tomato, and Wellbutrin [bupropion]    Review of Systems   Review of Systems  All other systems reviewed and are negative.   Physical Exam Updated Vital Signs BP (!) 135/53 (BP Location: Left Arm)   Pulse 86   Temp 97.8 F (36.6 C) (Oral)   Resp 15   Ht 5\' 5"  (1.651 m)   Wt 134 kg   SpO2 97%   BMI 49.16 kg/m  Physical Exam Vitals and nursing note reviewed.  Constitutional:      General: He is not in acute distress.     Appearance: Normal appearance. He is well-developed. He is not diaphoretic.     Comments: Alert, chronically ill-appearing but in no acute distress  HENT:     Head: Normocephalic and atraumatic.  Eyes:     General:        Right eye: No discharge.        Left eye: No discharge.     Pupils: Pupils are equal, round, and reactive to light.  Cardiovascular:     Rate and Rhythm: Normal rate and regular rhythm.     Pulses: Normal pulses.  Pulmonary:     Effort: Pulmonary effort is normal. No respiratory distress.     Breath sounds: No wheezing or rales.     Comments: Respirations equal and unlabored, patient able to speak in full sentences, lungs clear to auscultation bilaterally  Abdominal:     General: There is no distension.     Comments: Abdomen soft, nondistended, nontender to palpation in all quadrants without guarding or peritoneal signs  Musculoskeletal:        General: No deformity.     Cervical back: Neck supple.     Comments: Swelling noted over the right AV fistula not tender to palpation, no overlying erythema Left AKA  Skin:    General: Skin is warm and dry.     Capillary Refill: Capillary refill takes less than 2 seconds.  Neurological:     Mental Status: He is alert and oriented to person, place, and time.     Coordination: Coordination normal.     Comments: Speech is clear, able to follow commands Moves extremities without ataxia, coordination intact  Psychiatric:        Mood and Affect: Mood normal.        Behavior: Behavior normal.     ED Results / Procedures / Treatments   Labs (all labs ordered are listed, but only abnormal results are displayed) Labs Reviewed - No data to display  EKG None  Radiology No results found.  Procedures Procedures    Medications Ordered in ED Medications - No data to display  ED Course/ Medical Decision Making/ A&P                             Medical Decision Making Adult male with need for dialysis presents for  evaluation.  He is awake, alert, without increased work of breathing, states that he is generally well no evidence for new phenomena such as infection, bacteremia, sepsis.  Patient tolerated his recent procedure well.  Case discussed with nephrology.  Amount and/or Complexity of Data Reviewed External Data Reviewed: notes.  Risk Decision regarding hospitalization.   Final Clinical Impression(s) / ED Diagnoses Final diagnoses:  ESRD on dialysis     Gerhard Munch, MD 03/07/23 [phone]

## 2023-03-07 NOTE — Progress Notes (Signed)
POST HD TX NOTE  03/07/23 1223  Vitals  Temp 98.2 F (36.8 C)  Temp Source Oral  BP 123/65  MAP (mmHg) 84  BP Location Left Arm (forearm)  BP Method Automatic  Patient Position (if appropriate) Lying  Pulse Rate 81  Pulse Rate Source Monitor  ECG Heart Rate 83  Resp (!) 21  Oxygen Therapy  SpO2 92 %  O2 Device Nasal Cannula  O2 Flow Rate (L/min) 3 L/min  Pulse Oximetry Type Continuous  During Treatment Monitoring  Intra-Hemodialysis Comments (S)   (post HD tx VS check)  Post Treatment  Dialyzer Clearance Lightly streaked  Duration of HD Treatment -hour(s) 3.25 hour(s)  Hemodialysis Intake (mL) 0 mL  Liters Processed 78  Fluid Removed (mL) 3000 mL  Tolerated HD Treatment Yes  Post-Hemodialysis Comments (S)  tx completed w/o problem, UF goal met, blood rinsed back, VSS. Mediation Admin: Heparin 5000 units bolus, Heparin Dwells 3800 units  Hemodialysis Catheter Left Internal jugular Double lumen Permanent (Tunneled)  Placement Date/Time: 03/03/23 0856   Serial / Lot #: [id number]  Expiration Date: 08/30/27  Time Out: Correct patient;Correct site;Correct procedure  Maximum sterile barrier precautions: Hand hygiene;Cap;Mask;Sterile gown;Sterile gloves;Large sterile ...  Site Condition No complications  Blue Lumen Status Heparin locked;Dead end cap in place  Red Lumen Status Heparin locked;Dead end cap in place  Purple Lumen Status N/A  Catheter fill solution Heparin 1000 units/ml  Catheter fill volume (Arterial) 1.9 cc  Catheter fill volume (Venous) 1.9  Dressing Type Transparent  Dressing Status Antimicrobial disc in place;Clean, Dry, Intact  Drainage Description None  Dressing Change Due 03/14/23  Post treatment catheter status Capped and Clamped

## 2023-03-07 NOTE — ED Notes (Signed)
Patient observed wheeling out of emergency department prior to discharge vitals or paperwork.

## 2023-03-07 NOTE — ED Notes (Signed)
Taken to dialysis by transporter. ?

## 2023-03-09 ENCOUNTER — Emergency Department (HOSPITAL_COMMUNITY)
Admission: EM | Admit: 2023-03-09 | Discharge: 2023-03-09 | Disposition: A | Discharge status: Home / Self Care | Payer: 59 | Attending: Nephrology | Admitting: Nephrology

## 2023-03-09 ENCOUNTER — Encounter (HOSPITAL_COMMUNITY): Payer: Self-pay

## 2023-03-09 ENCOUNTER — Other Ambulatory Visit: Payer: Self-pay

## 2023-03-09 DIAGNOSIS — N186 End stage renal disease: Secondary | ICD-10-CM | POA: Insufficient documentation

## 2023-03-09 DIAGNOSIS — Z992 Dependence on renal dialysis: Secondary | ICD-10-CM | POA: Insufficient documentation

## 2023-03-09 DIAGNOSIS — Z794 Long term (current) use of insulin: Secondary | ICD-10-CM | POA: Diagnosis not present

## 2023-03-09 DIAGNOSIS — Z7982 Long term (current) use of aspirin: Secondary | ICD-10-CM | POA: Insufficient documentation

## 2023-03-09 DIAGNOSIS — Z7902 Long term (current) use of antithrombotics/antiplatelets: Secondary | ICD-10-CM | POA: Insufficient documentation

## 2023-03-09 LAB — HEPATITIS B SURFACE ANTIGEN: Hepatitis B Surface Ag: NONREACTIVE

## 2023-03-09 MED ORDER — CHLORHEXIDINE GLUCONATE CLOTH 2 % EX PADS: 6.0000 | MEDICATED_PAD | Freq: Every day | CUTANEOUS | Status: DC

## 2023-03-09 MED ORDER — HEPARIN SODIUM (PORCINE) 1000 UNIT/ML IJ SOLN
INTRAMUSCULAR | Status: AC
  Administered 2023-03-09: 1000 [IU]
  Filled 2023-03-09: qty 4

## 2023-03-09 NOTE — ED Triage Notes (Signed)
Pt here to get dialysis. Pt denies any complaints 

## 2023-03-09 NOTE — ED Provider Notes (Signed)
[place] EMERGENCY DEPARTMENT AT [facility] Provider Note   CSN: [id number] Arrival date & time: 03/09/23  3953     History  Chief Complaint  Patient presents with   needs dialysis    Jeffrey [name] is a 62 y.o. male.  Patient presents to the emergency department for dialysis.  Patient presenting 5 days post ligation of right AV hemodialysis access with placement of left tunneled port.  Patient with no other complaints at this time such as fever, shortness of breath.  HPI     Home Medications Prior to Admission medications   Medication Sig Start Date End Date Taking? Authorizing Provider  amLODipine (NORVASC) 10 MG tablet Take 10 mg by mouth every morning. 04/17/20   [provider]  ASPIRIN LOW DOSE 81 MG EC tablet Take 81 mg by mouth in the morning. 02/22/21   [provider]  atorvastatin (LIPITOR) 40 MG tablet Take 40 mg by mouth in the morning. 02/09/21   [provider]  calcium acetate (PHOSLO) 667 MG capsule Take 2 capsules (1,334 mg total) by mouth with breakfast, with lunch, and with evening meal. 04/10/21   Calvert Cantor, MD  carvedilol (COREG) 12.5 MG tablet Take 12.5 mg by mouth 2 (two) times daily with a meal. 04/17/20   [provider]  Cholecalciferol (VITAMIN D) 50 MCG (2000 UT) CAPS Take 2,000 Units by mouth daily.    [provider]  clopidogrel (PLAVIX) 75 MG tablet Take 1 tablet (75 mg total) by mouth daily. 11/02/22   Rhyne, Ames Coupe, PA-C  cyclobenzaprine (FLEXERIL) 10 MG tablet Take 10 mg by mouth 3 (three) times daily as needed for muscle spasms. 12/29/21   [provider]  diazepam (VALIUM) 5 MG tablet Take 5 mg by mouth 2 (two) times daily as needed for muscle spasms. 12/09/21   [provider]  insulin regular human CONCENTRATED (HUMULIN R U-500 KWIKPEN) 500 UNIT/ML KwikPen Inject 45 Units into the skin daily with supper. Patient taking differently: Inject 125 Units into the skin 2 (two)  times daily with a meal. 09/04/21 08/30/24  [name], MD  metolazone (ZAROXOLYN) 10 MG tablet Take 10 mg by mouth daily. 07/13/21   [provider]  nortriptyline (PAMELOR) 10 MG capsule Take 20 mg by mouth at bedtime. 10/23/19 08/30/24  [provider]  oxyCODONE (ROXICODONE) 5 MG immediate release tablet Take 1 tablet (5 mg total) by mouth every 8 (eight) hours as needed. Patient taking differently: Take 5 mg by mouth every 8 (eight) hours as needed for moderate pain. 12/31/22 12/31/23  [name], [name], DO  pantoprazole (PROTONIX) 40 MG tablet Take 40 mg by mouth daily before breakfast. 02/09/21   [provider]  pregabalin (LYRICA) 75 MG capsule Take 1 capsule (75 mg total) by mouth See admin instructions. Daily.  Give after dialysis on dialysis days. 01/21/23   [name], MD  sertraline (ZOLOFT) 100 MG tablet Take 100 mg by mouth in the morning. 02/22/21   [provider]  tadalafil (CIALIS) 20 MG tablet Take 20 mg by mouth daily as needed for erectile dysfunction. Patient not taking: Reported on 02/28/2023    [provider]  tamsulosin (FLOMAX) 0.4 MG CAPS capsule Take 1 capsule (0.4 mg total) by mouth daily. 11/10/21   [name], MD  torsemide (DEMADEX) 20 MG tablet Take 120 mg by mouth 2 (two) times daily.    [provider]  traZODone (DESYREL) 150 MG tablet Take  150 mg by mouth at bedtime. 12/09/21   [provider]      Allergies    Actos [pioglitazone], Dexmedetomidine, Ibuprofen, Tomato, and Wellbutrin [bupropion]    Review of Systems   Review of Systems  Physical Exam Updated Vital Signs BP (!) 139/47 (BP Location: Left Arm)   Pulse 86   Temp 97.7 F (36.5 C)   Resp 18   Ht 5\' 5"  (1.651 m)   Wt 133.6 kg   SpO2 98%   BMI 49.01 kg/m  Physical Exam Vitals and nursing note reviewed.  Constitutional:      General: He is not in acute distress.    Appearance: Normal appearance. He is well-developed. He is  not diaphoretic.     Comments: Alert, chronically ill-appearing but in no acute distress  HENT:     Head: Normocephalic and atraumatic.  Eyes:     General:        Right eye: No discharge.        Left eye: No discharge.     Pupils: Pupils are equal, round, and reactive to light.  Cardiovascular:     Rate and Rhythm: Normal rate and regular rhythm.     Pulses: Normal pulses.  Pulmonary:     Effort: Pulmonary effort is normal. No respiratory distress.     Breath sounds: No wheezing or rales.     Comments: Respirations equal and unlabored, patient able to speak in full sentences, lungs clear to auscultation bilaterally  Abdominal:     General: There is no distension.     Comments: Abdomen soft, nondistended, nontender to palpation in all quadrants without guarding or peritoneal signs  Musculoskeletal:        General: No deformity.     Cervical back: Neck supple.     Comments: Swelling noted over the right AV fistula not tender to palpation, no overlying erythema Left AKA  Skin:    General: Skin is warm and dry.     Capillary Refill: Capillary refill takes less than 2 seconds.  Neurological:     Mental Status: He is alert and oriented to person, place, and time.     Coordination: Coordination normal.     Comments: Speech is clear, able to follow commands Moves extremities without ataxia, coordination intact  Psychiatric:        Mood and Affect: Mood normal.        Behavior: Behavior normal.     ED Results / Procedures / Treatments   Labs (all labs ordered are listed, but only abnormal results are displayed) Labs Reviewed  HEPATITIS B SURFACE ANTIGEN  HEPATITIS B SURFACE ANTIBODY, QUANTITATIVE    EKG None  Radiology No results found.  Procedures Procedures    Medications Ordered in ED Medications  Chlorhexidine Gluconate Cloth 2 % PADS 6 each (has no administration in time range)    ED Course/ Medical Decision Making/ A&P                             Medical  Decision Making  Patient with need for dialysis presenting for evaluation.  Patient without increased work of breathing and no new complaints.  Patient states he tolerated the recent procedure.  I discussed the case with nephrology.  Dr. Verna Czech says they will take care of getting the hemodialysis set up and will draw labs during the hemodialysis session.        Final Clinical Impression(s) /  ED Diagnoses Final diagnoses:  Encounter for hemodialysis for ESRD    Rx / DC Orders ED Discharge Orders     None         Pamala DuffelMcCauley, Julius Matus B, PA-C 03/09/23 0901    Tegeler, Canary Brimhristopher J, MD 03/09/23 [phone]

## 2023-03-09 NOTE — Progress Notes (Signed)
Received patient in bed to unit.  Alert and oriented.  Informed consent signed and in chart.   TX duration:2.25. Pt cut off 1.75 h d/t tired. AMA form signed. Ozzie Hoyle PA notified.  Patient tolerated well.  Transported back to the room  Alert, without acute distress.  Hand-off given to patient's nurse.   Access used: catheter Access issues: none  Total UF removed: 1.4 L Medication(s) given: none Post HD VS: 140/70 P 82. R 18. O2 sat 100 % in 2 L using. Post HD weight: 132.2kg   [facility]

## 2023-03-09 NOTE — ED Provider Notes (Signed)
Patient has finished dialysis.  He feels better.  Discharge.   Virgina Norfolk, DO 03/09/23 1647

## 2023-03-09 NOTE — Progress Notes (Signed)
Met with pt at bedside while pt receiving HD. Pt advised that Dr Allena Katz with [facility] is agreeable to accept pt at [facility] with a strict behavioral contract being signed/completed prior to treatment being started at clinic. Pt also advised that transportation with medicaid transportation will have to approved for [place] transport if pt is accepted at [facility]. Pt also advised that upper management of [facility] is the same person as upper management of his former clinic. With all this info being discussed and known, pt agreeable to referral being made to [facility] admissions for [facility]. Referral submitted to [facility] admissions this afternoon. Update provided to Dr Allena Katz as well. Pt advised that he will need to continue to come to [facility] ED for HD until it is determined if new clinic will accept/approve pt. Pt voices understanding. Navigator will likely be able to provide update to pt by early next week. Will contact pt via phone or while here for HD once any updates are known.   Olivia Canter Renal Navigator [phone]

## 2023-03-10 LAB — HEPATITIS B SURFACE ANTIBODY, QUANTITATIVE: Hep B S AB Quant (Post): 33.1 m[IU]/mL (ref >9.9)

## 2023-03-10 NOTE — Discharge Summary (Signed)
Discharge Summary  Patient ID: Jeffrey [name] [id number] 62 y.o. 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 He has a previous left arm AV fistula which [facility] We are planning to place a tunneled dialysis catheter and consider repeat left arm access in the future   Hospital Course: The patient was admitted to the hospital on 03/03/2023 and underwent ligation of right arm AV graft and ultrasound guided placement of left internal jugular vein tunneled dialysis catheter.  He tolerated the procedure well and was transferred to the PACU in stable condition.  By POD 1, he was tolerating a normal diet without swallowing difficulties and mobilizing well.  He received dialysis through his tunneled catheter prior to discharge.  He was discharged home on POD 1.  Consults: None  Treatments: IV hydration, antibiotics: Ancef, and dialysis: Hemodialysis  Discharge Exam: See progress note  Vitals:   03/04/23 1425 03/04/23 1426  BP:  (!) 133/58  Pulse: 86 87  Resp: (!) 22 16  Temp:  98.1 F (36.7 C)  SpO2: 100% 99%     Disposition: Discharge disposition: 01-Home or Self Care       Patient Instructions:  Allergies as of 03/04/2023       Reactions   Actos [pioglitazone] Anaphylaxis, Rash   Dexmedetomidine Nausea And Vomiting   (Precedex) Dose-limiting bradycardia    Ibuprofen Shortness Of Breath, Swelling   Pt tolerates aspirin   Tomato Anaphylaxis   Only allergic to RAW tomatoes   Wellbutrin [bupropion] Swelling        Medication List     TAKE these medications    amLODipine 10 MG tablet Commonly known as: NORVASC Take 10 mg by mouth every morning.   Aspirin Low Dose 81 MG tablet Generic drug: aspirin EC Take 81 mg by mouth in the morning.   atorvastatin 40 MG tablet Commonly known as: LIPITOR Take 40 mg by mouth in the morning.   calcium acetate 667 MG capsule Commonly known as: PHOSLO Take 2 capsules (1,334 mg total) by mouth with breakfast, with lunch, and with evening meal.   carvedilol 12.5 MG tablet Commonly known as: COREG Take 12.5 mg by mouth 2 (two) times daily with a meal.   clopidogrel 75 MG tablet Commonly known as: PLAVIX Take 1 tablet (75 mg total) by mouth daily.   cyclobenzaprine 10 MG tablet Commonly known as: FLEXERIL Take 10 mg by mouth 3 (three) times daily as needed for muscle spasms.   diazepam 5 MG tablet Commonly known as: VALIUM Take 5 mg by mouth 2 (two) times daily as needed for muscle spasms.   HumuLIN R U-500 KwikPen 500 UNIT/ML KwikPen Generic drug: insulin regular human CONCENTRATED Inject 45 Units into the skin daily with supper. What changed:  how much to take when to take this   metolazone 10 MG tablet Commonly known as: ZAROXOLYN Take 10 mg by mouth daily.   nortriptyline  10 MG capsule Commonly known as: PAMELOR Take 20 mg by mouth at bedtime.   oxyCODONE 5 MG immediate release tablet Commonly known as: Roxicodone Take 1 tablet (5 mg total) by mouth every 8 (eight) hours as needed. What changed: reasons to take this   pantoprazole 40 MG tablet Commonly known as: PROTONIX Take 40 mg by mouth daily before breakfast.   pregabalin 75 MG capsule Commonly known as: LYRICA Take 1 capsule (75 mg total) by mouth See admin instructions. Daily.  Give after dialysis on dialysis  days. Notes to patient: Take on Dialysis days    sertraline 100 MG tablet Commonly known as: ZOLOFT Take 100 mg by mouth in the morning.   tadalafil 20 MG tablet Commonly known as: CIALIS Take 20 mg by mouth daily as needed for erectile dysfunction.   tamsulosin 0.4 MG Caps capsule Commonly known as: FLOMAX Take 1 capsule (0.4 mg total) by mouth daily.   torsemide 20 MG tablet Commonly known as: DEMADEX Take 120 mg by mouth 2 (two) times daily.   traZODone 150 MG tablet Commonly known as: DESYREL Take 150 mg by mouth at bedtime.   Vitamin D 50 MCG (2000 UT) Caps Take 2,000 Units by mouth daily.               Discharge Care Instructions  (From admission, onward)           Start     Ordered   03/04/23 0000  Discharge wound care:       Comments: Wash right arm incision daily with soap and water, pat dry   03/04/23 0827           Activity: no heavy lifting for 4 weeks Diet: low fat, low cholesterol diet and renal diet Wound Care: keep wound clean and dry  Follow-up with Dr.Cain in 1 month.  SignedErnestene Mention, PA-C 03/10/2023 11:17 PM [facility] Office: [phone]

## 2023-03-10 NOTE — Progress Notes (Signed)
Late Note Entry   Received a call from [facility] admissions this afternoon to say that pt's referral has been denied for all area [facility] clinics. Update provided to Dr Allena Katz. Also contacted pt via phone to make pt aware of the above info. Pt advised that he will need to continue to come to [facility] ED for HD.   Olivia Canter Renal Navigator [phone]

## 2023-03-11 ENCOUNTER — Other Ambulatory Visit: Payer: Self-pay

## 2023-03-11 ENCOUNTER — Emergency Department (HOSPITAL_COMMUNITY)
Admission: EM | Admit: 2023-03-11 | Discharge: 2023-03-11 | Discharge status: Against Medical Advice | Payer: 59 | Attending: Student in an Organized Health Care Education/Training Program | Admitting: Student in an Organized Health Care Education/Training Program

## 2023-03-11 DIAGNOSIS — E1122 Type 2 diabetes mellitus with diabetic chronic kidney disease: Secondary | ICD-10-CM | POA: Insufficient documentation

## 2023-03-11 DIAGNOSIS — Z7982 Long term (current) use of aspirin: Secondary | ICD-10-CM | POA: Insufficient documentation

## 2023-03-11 DIAGNOSIS — N186 End stage renal disease: Secondary | ICD-10-CM | POA: Insufficient documentation

## 2023-03-11 DIAGNOSIS — I509 Heart failure, unspecified: Secondary | ICD-10-CM | POA: Insufficient documentation

## 2023-03-11 DIAGNOSIS — Z7902 Long term (current) use of antithrombotics/antiplatelets: Secondary | ICD-10-CM | POA: Diagnosis not present

## 2023-03-11 DIAGNOSIS — Z5329 Procedure and treatment not carried out because of patient's decision for other reasons: Secondary | ICD-10-CM | POA: Diagnosis not present

## 2023-03-11 DIAGNOSIS — Z79899 Other long term (current) drug therapy: Secondary | ICD-10-CM | POA: Insufficient documentation

## 2023-03-11 DIAGNOSIS — Z794 Long term (current) use of insulin: Secondary | ICD-10-CM | POA: Diagnosis not present

## 2023-03-11 DIAGNOSIS — I132 Hypertensive heart and chronic kidney disease with heart failure and with stage 5 chronic kidney disease, or end stage renal disease: Secondary | ICD-10-CM | POA: Insufficient documentation

## 2023-03-11 DIAGNOSIS — J449 Chronic obstructive pulmonary disease, unspecified: Secondary | ICD-10-CM | POA: Insufficient documentation

## 2023-03-11 DIAGNOSIS — Z992 Dependence on renal dialysis: Secondary | ICD-10-CM | POA: Insufficient documentation

## 2023-03-11 LAB — CBC
HCT: 27.2 % — ABNORMAL LOW (ref 39.0–52.0)
Hemoglobin: 8.6 g/dL — ABNORMAL LOW (ref 13.0–17.0)
MCH: 28.5 pg (ref 26.0–34.0)
MCHC: 31.6 g/dL (ref 30.0–36.0)
MCV: 90.1 fL (ref 80.0–100.0)
Platelets: 129 10*3/uL — ABNORMAL LOW (ref 150–400)
RBC: 3.02 MIL/uL — ABNORMAL LOW (ref 4.22–5.81)
RDW: 13.8 % (ref 11.5–15.5)
WBC: 7.7 10*3/uL (ref 4.0–10.5)
nRBC: 0 % (ref 0.0–0.2)

## 2023-03-11 LAB — RENAL FUNCTION PANEL
Albumin: 3.7 g/dL (ref 3.5–5.0)
Anion gap: 17 — ABNORMAL HIGH (ref 5–15)
BUN: 77 mg/dL — ABNORMAL HIGH (ref 8–23)
CO2: 27 mmol/L (ref 22–32)
Calcium: 8.6 mg/dL — ABNORMAL LOW (ref 8.9–10.3)
Chloride: 92 mmol/L — ABNORMAL LOW (ref 98–111)
Creatinine, Ser: 5.07 mg/dL — ABNORMAL HIGH (ref 0.61–1.24)
GFR, Estimated: 12 mL/min — ABNORMAL LOW (ref >60)
Glucose, Bld: 225 mg/dL — ABNORMAL HIGH (ref 70–99)
Phosphorus: 3.8 mg/dL (ref 2.5–4.6)
Potassium: 3.7 mmol/L (ref 3.5–5.1)
Sodium: 136 mmol/L (ref 135–145)

## 2023-03-11 LAB — HEPATITIS B SURFACE ANTIGEN: Hepatitis B Surface Ag: NONREACTIVE

## 2023-03-11 MED ORDER — LIDOCAINE-PRILOCAINE 2.5-2.5 % EX CREA
1.0000 | TOPICAL_CREAM | CUTANEOUS | Status: DC | PRN
  Filled 2023-03-11: qty 5

## 2023-03-11 MED ORDER — CHLORHEXIDINE GLUCONATE CLOTH 2 % EX PADS: 6.0000 | MEDICATED_PAD | Freq: Every day | CUTANEOUS | Status: DC

## 2023-03-11 MED ORDER — PENTAFLUOROPROP-TETRAFLUOROETH EX AERO
1.0000 | INHALATION_SPRAY | CUTANEOUS | Status: DC | PRN
  Filled 2023-03-11: qty 116

## 2023-03-11 MED ORDER — ALTEPLASE 2 MG IJ SOLR: 2.0000 mg | Freq: Once | INTRAMUSCULAR | Status: DC | PRN

## 2023-03-11 MED ORDER — ANTICOAGULANT SODIUM CITRATE 4% (200MG/5ML) IV SOLN
5.0000 mL | Status: DC | PRN
  Filled 2023-03-11: qty 5

## 2023-03-11 MED ORDER — HEPARIN SODIUM (PORCINE) 1000 UNIT/ML DIALYSIS: 1000.0000 [IU] | INTRAMUSCULAR | Status: DC | PRN

## 2023-03-11 MED ORDER — LIDOCAINE HCL (PF) 1 % IJ SOLN: 5.0000 mL | INTRAMUSCULAR | Status: DC | PRN

## 2023-03-11 NOTE — ED Provider Notes (Signed)
[place] EMERG[facility] DE[facility] AT [facility] Provider Note   CSN: [id number] Arrival date & time: 03/11/23  8592     History  No chief complaint on file.   Jeffrey [name] is a 62 y.o. male.  Patient is a 62 year old male with a history of end-stage renal disease on dialysis Tuesday Thursday [name], Jeffrey [name], Jeffrey [name], Jeffrey [name], Jeffrey [name] therapy, prior bilateral BKA's currently wheelchair-bound who gets his dialysis regularly in the emergency room who is here today for his regularly scheduled dialysis.  Patient has no complaints otherwise today.  Last dialysis was on Thursday and he had a full course.  The history is provided by the patient.       Home Medications Prior to Admission medications   Medication Sig Start Date End Date Taking? Authorizing Provider  amLODipine (NORVASC) 10 MG tablet Take 10 mg by mouth every morning. 04/17/20   [provider]  ASPIRIN LOW DOSE 81 MG EC tablet Take 81 mg by mouth in the morning. 02/22/21   [provider]  atorvastatin (LIPITOR) 40 MG tablet Take 40 mg by mouth in the morning. 02/09/21   [provider]  calcium acetate (PHOSLO) 667 MG capsule Take 2 capsules (1,334 mg total) by mouth with breakfast, with lunch, and with evening meal. 04/10/21   Calvert Cantor, MD  carvedilol (COREG) 12.5 MG tablet Take 12.5 mg by mouth 2 (two) times daily with a meal. 04/17/20   [provider]  Cholecalciferol (VITAMIN D) 50 MCG (2000 UT) CAPS Take 2,000 Units by mouth daily.    [provider]  clopidogrel (PLAVIX) 75 MG tablet Take 1 tablet (75 mg total) by mouth daily. 11/02/22   Rhyne, Ames Coupe, PA-C  cyclobenzaprine (FLEXERIL) 10 MG tablet Take 10 mg by mouth 3 (three) times daily as needed for muscle spasms. 12/29/21   [provider]  diazepam (VALIUM) 5 MG tablet Take 5 mg by mouth 2 (two) times daily as needed for muscle spasms. 12/09/21   [provider]  insulin  regular human CONCENTRATED (HUMULIN R U-500 KWIKPEN) 500 UNIT/ML KwikPen Inject 45 Units into the skin daily with supper. Patient taking differently: Inject 125 Units into the skin 2 (two) times daily with a meal. 09/04/21 08/30/24  [name], MD  metolazone (ZAROXOLYN) 10 MG tablet Take 10 mg by mouth daily. 07/13/21   [provider]  nortriptyline (PAMELOR) 10 MG capsule Take 20 mg by mouth at bedtime. 10/23/19 08/30/24  [provider]  oxyCODONE (ROXICODONE) 5 MG immediate release tablet Take 1 tablet (5 mg total) by mouth every 8 (eight) hours as needed. Patient taking differently: Take 5 mg by mouth every 8 (eight) hours as needed for moderate pain. 12/31/22 12/31/23  [name], [name], DO  pantoprazole (PROTONIX) 40 MG tablet Take 40 mg by mouth daily before breakfast. 02/09/21   [provider]  pregabalin (LYRICA) 75 MG capsule Take 1 capsule (75 mg total) by mouth See admin instructions. Daily.  Give after dialysis on dialysis days. 01/21/23   [name], MD  sertraline (ZOLOFT) 100 MG tablet Take 100 mg by mouth in the morning. 02/22/21   [provider]  tadalafil (CIALIS) 20 MG tablet Take 20 mg by mouth daily as needed for erectile dysfunction. Patient not taking: Reported on 02/28/2023    [provider]  tamsulosin (FLOMAX) 0.4 MG CAPS capsule Take 1 capsule (0.4 mg total) by mouth daily. 11/10/21   [name], MD  torsemide (DEMADEX) 20 MG tablet Take 120 mg by mouth 2 (two) times daily.    [provider]  traZODone (DESYREL) 150 MG tablet Take 150 mg by mouth at bedtime. 12/09/21   [provider]      Allergies    Actos [pioglitazone], Dexmedetomidine, Ibuprofen, Tomato, and Wellbutrin [bupropion]    Review of Systems   Review of Systems  Physical Exam Updated Vital Signs BP (!) 123/48 (BP Location: Right Wrist)   Pulse 85   Temp 98 F (36.7 C)   Resp 16   SpO2 100%  Physical Exam Vitals and nursing note  reviewed.  Constitutional:      Appearance: Normal appearance.  HENT:     Head: Normocephalic.  Cardiovascular:     Rate and Rhythm: Normal rate.  Pulmonary:     Effort: Pulmonary effort is normal.     Breath sounds: No wheezing.     Comments: Patient on chronic [name] Musculoskeletal:     Comments: Bilateral BKA.  Fistula present in the right upper arm  Skin:    General: Skin is warm and dry.  Neurological:     Mental Status: He is alert. Mental status is at baseline.     ED Results / Procedures / Treatments   Labs (all labs ordered are listed, but only abnormal results are displayed) Labs Reviewed  HEPATITIS B SURFACE ANTIGEN  HEPATITIS B SURFACE ANTIBODY, QUANTITATIVE    EKG None  Radiology No results found.  Procedures Procedures    Medications Ordered in ED Medications  Chlorhexidine Gluconate Cloth 2 % PADS 6 each (has no administration in time range)    ED Course/ Medical Decision Making/ A&P                             Medical Decision Making  Patient is a 62 year old male with multiple medical problems who is here today for his regular scheduled dialysis.  Last full course of dialysis was on Thursday.  Patient has no current complaints at this time.  Discussed with Dr. Glenna Fellows with nephrology service.  Patient will go to dialysis.        Final Clinical Impression(s) / ED Diagnoses Final diagnoses:  ESRD (end stage renal disease)    Rx / DC Orders ED Discharge Orders     None         Gwyneth Sprout, MD 03/11/23 [phone]

## 2023-03-11 NOTE — ED Triage Notes (Signed)
Pt here for routine dialysis. No complaints 

## 2023-03-11 NOTE — ED Notes (Signed)
Patient frustrated with wait for dialysis. Wait much longer than dialysis had told him. Will return Tuesday.

## 2023-03-11 NOTE — Progress Notes (Signed)
Charge RN called ED for pt staff notified pt left ED, MD notified

## 2023-03-14 ENCOUNTER — Other Ambulatory Visit: Payer: Self-pay

## 2023-03-14 ENCOUNTER — Emergency Department (HOSPITAL_COMMUNITY)
Admission: EM | Admit: 2023-03-14 | Discharge: 2023-03-15 | Discharge status: Against Medical Advice | Payer: 59 | Attending: Emergency Medicine | Admitting: Emergency Medicine

## 2023-03-14 DIAGNOSIS — N186 End stage renal disease: Secondary | ICD-10-CM | POA: Diagnosis not present

## 2023-03-14 DIAGNOSIS — Z5329 Procedure and treatment not carried out because of patient's decision for other reasons: Secondary | ICD-10-CM | POA: Diagnosis not present

## 2023-03-14 DIAGNOSIS — Z7902 Long term (current) use of antithrombotics/antiplatelets: Secondary | ICD-10-CM | POA: Insufficient documentation

## 2023-03-14 DIAGNOSIS — Z7982 Long term (current) use of aspirin: Secondary | ICD-10-CM | POA: Insufficient documentation

## 2023-03-14 DIAGNOSIS — J449 Chronic obstructive pulmonary disease, unspecified: Secondary | ICD-10-CM | POA: Insufficient documentation

## 2023-03-14 DIAGNOSIS — Z992 Dependence on renal dialysis: Secondary | ICD-10-CM | POA: Insufficient documentation

## 2023-03-14 DIAGNOSIS — I132 Hypertensive heart and chronic kidney disease with heart failure and with stage 5 chronic kidney disease, or end stage renal disease: Secondary | ICD-10-CM | POA: Insufficient documentation

## 2023-03-14 DIAGNOSIS — Z794 Long term (current) use of insulin: Secondary | ICD-10-CM | POA: Diagnosis not present

## 2023-03-14 DIAGNOSIS — E1122 Type 2 diabetes mellitus with diabetic chronic kidney disease: Secondary | ICD-10-CM | POA: Diagnosis not present

## 2023-03-14 DIAGNOSIS — I509 Heart failure, unspecified: Secondary | ICD-10-CM | POA: Diagnosis not present

## 2023-03-14 DIAGNOSIS — Z79899 Other long term (current) drug therapy: Secondary | ICD-10-CM | POA: Diagnosis not present

## 2023-03-14 LAB — RENAL FUNCTION PANEL
Albumin: 3.5 g/dL (ref 3.5–5.0)
Anion gap: 14 (ref 5–15)
BUN: 105 mg/dL — ABNORMAL HIGH (ref 8–23)
CO2: 31 mmol/L (ref 22–32)
Calcium: 7.9 mg/dL — ABNORMAL LOW (ref 8.9–10.3)
Chloride: 92 mmol/L — ABNORMAL LOW (ref 98–111)
Creatinine, Ser: 5.37 mg/dL — ABNORMAL HIGH (ref 0.61–1.24)
GFR, Estimated: 11 mL/min — ABNORMAL LOW (ref >60)
Glucose, Bld: 229 mg/dL — ABNORMAL HIGH (ref 70–99)
Phosphorus: 4.4 mg/dL (ref 2.5–4.6)
Potassium: 3.1 mmol/L — ABNORMAL LOW (ref 3.5–5.1)
Sodium: 137 mmol/L (ref 135–145)

## 2023-03-14 LAB — CBC
HCT: 25.2 % — ABNORMAL LOW (ref 39.0–52.0)
Hemoglobin: 8.1 g/dL — ABNORMAL LOW (ref 13.0–17.0)
MCH: 28.6 pg (ref 26.0–34.0)
MCHC: 32.1 g/dL (ref 30.0–36.0)
MCV: 89 fL (ref 80.0–100.0)
Platelets: 124 10*3/uL — ABNORMAL LOW (ref 150–400)
RBC: 2.83 MIL/uL — ABNORMAL LOW (ref 4.22–5.81)
RDW: 13.5 % (ref 11.5–15.5)
WBC: 6.9 10*3/uL (ref 4.0–10.5)
nRBC: 0 % (ref 0.0–0.2)

## 2023-03-14 LAB — HEPATITIS B SURFACE ANTIBODY, QUANTITATIVE: Hep B S AB Quant (Post): 34.9 m[IU]/mL (ref >9.9)

## 2023-03-14 MED ORDER — CHLORHEXIDINE GLUCONATE CLOTH 2 % EX PADS: 6.0000 | MEDICATED_PAD | Freq: Every day | CUTANEOUS | Status: DC

## 2023-03-14 MED ORDER — HEPARIN SODIUM (PORCINE) 1000 UNIT/ML DIALYSIS: 1000.0000 [IU] | INTRAMUSCULAR | Status: DC | PRN

## 2023-03-14 MED ORDER — ANTICOAGULANT SODIUM CITRATE 4% (200MG/5ML) IV SOLN: 5.0000 mL | Status: DC | PRN

## 2023-03-14 MED ORDER — HEPARIN SODIUM (PORCINE) 1000 UNIT/ML DIALYSIS
2000.0000 [IU] | Freq: Once | INTRAMUSCULAR | Status: AC
  Administered 2023-03-14: 2000 [IU] via INTRAVENOUS_CENTRAL
  Filled 2023-03-14: qty 2

## 2023-03-14 MED ORDER — ALTEPLASE 2 MG IJ SOLR
2.0000 mg | Freq: Once | INTRAMUSCULAR | Status: AC | PRN
  Administered 2023-03-14: 2 mg
  Filled 2023-03-14: qty 2

## 2023-03-14 NOTE — ED Triage Notes (Signed)
Pt here for routine dialysis. No complaints 

## 2023-03-14 NOTE — ED Provider Notes (Signed)
[place] EMERGENCY DEPARTMENT AT [facility] Provider Note   CSN: [id number] Arrival date & time: 03/14/23  3244     History  No chief complaint on file.   Jeffrey [name] is a 62 y.o. male.  Pt is a 62 yo male with pmhx significant for ESRD on HD, DM, HTN, CHF, COPD, and PTSD.  Pt has been dismissed from his outpatient dialysis center and comes to the ED for dialysis.  Pt was last dialyzed on 4/11 and it looks like he did not receive a full session then.  Pt is sob now.  Per Renal Navigator SW note on 4/11, pt's referral has been denied for all area [facility] clinics.  Pt did come to the ED on 4/13 for dialysis, but left after waiting for several hours.         Home Medications Prior to Admission medications   Medication Sig Start Date End Date Taking? Authorizing Provider  amLODipine (NORVASC) 10 MG tablet Take 10 mg by mouth every morning. 04/17/20   [provider]  ASPIRIN LOW DOSE 81 MG EC tablet Take 81 mg by mouth in the morning. 02/22/21   [provider]  atorvastatin (LIPITOR) 40 MG tablet Take 40 mg by mouth in the morning. 02/09/21   [provider]  calcium acetate (PHOSLO) 667 MG capsule Take 2 capsules (1,334 mg total) by mouth with breakfast, with lunch, and with evening meal. 04/10/21   Calvert Cantor, MD  carvedilol (COREG) 12.5 MG tablet Take 12.5 mg by mouth 2 (two) times daily with a meal. 04/17/20   [provider]  Cholecalciferol (VITAMIN D) 50 MCG (2000 UT) CAPS Take 2,000 Units by mouth daily.    [provider]  clopidogrel (PLAVIX) 75 MG tablet Take 1 tablet (75 mg total) by mouth daily. 11/02/22   Rhyne, Ames Coupe, PA-C  cyclobenzaprine (FLEXERIL) 10 MG tablet Take 10 mg by mouth 3 (three) times daily as needed for muscle spasms. 12/29/21   [provider]  diazepam (VALIUM) 5 MG tablet Take 5 mg by mouth 2 (two) times daily as needed for muscle spasms. 12/09/21   [provider]  insulin  regular human CONCENTRATED (HUMULIN R U-500 KWIKPEN) 500 UNIT/ML KwikPen Inject 45 Units into the skin daily with supper. Patient taking differently: Inject 125 Units into the skin 2 (two) times daily with a meal. 09/04/21 08/30/24  [name], MD  metolazone (ZAROXOLYN) 10 MG tablet Take 10 mg by mouth daily. 07/13/21   [provider]  nortriptyline (PAMELOR) 10 MG capsule Take 20 mg by mouth at bedtime. 10/23/19 08/30/24  [provider]  oxyCODONE (ROXICODONE) 5 MG immediate release tablet Take 1 tablet (5 mg total) by mouth every 8 (eight) hours as needed. Patient taking differently: Take 5 mg by mouth every 8 (eight) hours as needed for moderate pain. 12/31/22 12/31/23  [name], [name], DO  pantoprazole (PROTONIX) 40 MG tablet Take 40 mg by mouth daily before breakfast. 02/09/21   [provider]  pregabalin (LYRICA) 75 MG capsule Take 1 capsule (75 mg total) by mouth See admin instructions. Daily.  Give after dialysis on dialysis days. 01/21/23   [name], MD  sertraline (ZOLOFT) 100 MG tablet Take 100 mg by mouth in the morning. 02/22/21   [provider]  tadalafil (CIALIS) 20 MG tablet Take 20 mg by mouth daily as needed for erectile dysfunction. Patient not taking: Reported on 02/28/2023    [provider]  tamsulosin (FLOMAX) 0.4 MG CAPS capsule Take 1 capsule (0.4 mg total) by mouth daily. 11/10/21   [name], MD  torsemide (DEMADEX) 20 MG tablet Take 120 mg by mouth 2 (two) times daily.    [provider]  traZODone (DESYREL) 150 MG tablet Take 150 mg by mouth at bedtime. 12/09/21   [provider]      Allergies    Actos [pioglitazone], Dexmedetomidine, Ibuprofen, Tomato, and Wellbutrin [bupropion]    Review of Systems   Review of Systems  Respiratory:  Positive for shortness of breath.   All other systems reviewed and are negative.   Physical Exam Updated Vital Signs BP (!) 147/54 (BP Location: Right Arm)    Pulse 81   Temp 98.6 F (37 C) (Oral)   Resp 18   SpO2 96%  Physical Exam Vitals and nursing note reviewed.  Constitutional:      Appearance: Normal appearance. He is obese.  HENT:     Head: Normocephalic and atraumatic.     Right Ear: External ear normal.     Left Ear: External ear normal.     Nose: Nose normal.     Mouth/Throat:     Mouth: Mucous membranes are moist.     Pharynx: Oropharynx is clear.  Eyes:     Extraocular Movements: Extraocular movements intact.     Conjunctiva/sclera: Conjunctivae normal.     Pupils: Pupils are equal, round, and reactive to light.  Cardiovascular:     Rate and Rhythm: Normal rate and regular rhythm.     Pulses: Normal pulses.     Heart sounds: Normal heart sounds.  Pulmonary:     Effort: Pulmonary effort is normal.     Breath sounds: Normal breath sounds.  Chest:     Comments: Dialysis port noted Abdominal:     General: Abdomen is flat. Bowel sounds are normal.     Palpations: Abdomen is soft.  Musculoskeletal:     Cervical back: Normal range of motion and neck supple.     Comments: L AKA  Skin:    General: Skin is warm.  Neurological:     Mental Status: He is alert.     ED Results / Procedures / Treatments   Labs (all labs ordered are listed, but only abnormal results are displayed) Labs Reviewed - No data to display  EKG None  Radiology No results found.  Procedures Procedures    Medications Ordered in ED Medications  Chlorhexidine Gluconate Cloth 2 % PADS 6 each (has no administration in time range)    ED Course/ Medical Decision Making/ A&P                             Medical Decision Making  Pt is here for dialysis as he's been refused care at all the local dialysis centers due to behavior.  He is d/w Dr. Arlean Hopping (nephrology) for dialysis who took him up to dialysis.        Final Clinical Impression(s) / ED Diagnoses Final diagnoses:  ESRD on hemodialysis    Rx / DC Orders ED Discharge Orders      None         Jacalyn Lefevre, MD 03/14/23 1217

## 2023-03-14 NOTE — Procedures (Signed)
   I was present at this dialysis session, have reviewed the session itself and made  appropriate changes Vinson Moselle MD [facility] pager [phone]   03/14/2023, 2:01 PM

## 2023-03-14 NOTE — Progress Notes (Signed)
We are asked to see this patient for dialysis. Pt was discharged from his OP HD unit in December 2023 due to behavioral issues. Pt is here in ED requesting hospital dialysis. Plan is for HD upstairs then return to ED for reassessment and expected discharge home.    Last hep B labs here were from 03/11/23     Last OP HD orders from dec 2023:  3:15h  129kg  RUE AVG   Heparin 2000   Vinson Moselle, MD 03/14/2023, 2:01 PM  Recent Labs  Lab 03/07/23 0924 03/11/23 1037  HGB 7.9* 8.6*  ALBUMIN 3.5 3.7  CALCIUM 8.2* 8.6*  PHOS 4.8* 3.8  CREATININE 6.52* 5.07*  K 3.0* 3.7

## 2023-03-14 NOTE — Progress Notes (Signed)
POST HD TX NOTE  03/14/23 1532  Vitals  Temp 98 F (36.7 C)  Temp Source Oral  BP (!) 152/66  MAP (mmHg) 90  BP Location Right Arm (forearm)  BP Method Automatic  Patient Position (if appropriate) Lying  Pulse Rate 77  Pulse Rate Source Monitor  ECG Heart Rate 89  Resp 15  Oxygen Therapy  SpO2 98 %  O2 Device Nasal Cannula  O2 Flow Rate (L/min) 3 L/min  Pulse Oximetry Type Continuous  During Treatment Monitoring  Intra-Hemodialysis Comments (S)   (post HD tx VS check)  Post Treatment  Dialyzer Clearance Lightly streaked  Duration of HD Treatment -hour(s) 2.75 hour(s) (2hr 49 min)  Hemodialysis Intake (mL) 0 mL  Liters Processed 67.6  Fluid Removed (mL) 2500 mL  Tolerated HD Treatment Yes  Post-Hemodialysis Comments (S)  tx ended 26 min early d/t pt  yelling out to take him off right now, pt visibly upset stating his daughter just called him and she needs him, UF goal still met, blood rinsed back, VSS. Medication Admin: Heparin 2000 units bolus, TPA 3.46ml  Hemodialysis Catheter Left Internal jugular Double lumen Permanent (Tunneled)  Placement Date/Time: 03/03/23 0856   Serial / Lot #: [id number]  Expiration Date: 08/30/27  Time Out: Correct patient;Correct site;Correct procedure  Maximum sterile barrier precautions: Hand hygiene;Cap;Mask;Sterile gown;Sterile gloves;Large sterile ...  Site Condition No complications  Blue Lumen Status Other (Comment);Dead end cap in place (TPA)  Red Lumen Status Other (Comment);Dead end cap in place (TPA)  Purple Lumen Status N/A  Catheter fill solution Other (Comment) (TPA)  Catheter fill volume (Arterial) 1.9 cc  Catheter fill volume (Venous) 1.9  Dressing Type Transparent  Dressing Status Antimicrobial disc in place;Clean, Dry, Intact  Dressing Change Due 03/21/23  Post treatment catheter status Capped and Clamped

## 2023-03-16 ENCOUNTER — Other Ambulatory Visit: Payer: Self-pay

## 2023-03-16 ENCOUNTER — Emergency Department (HOSPITAL_COMMUNITY)
Admission: EM | Admit: 2023-03-16 | Discharge: 2023-03-16 | Discharge status: Against Medical Advice | Payer: 59 | Attending: Emergency Medicine | Admitting: Emergency Medicine

## 2023-03-16 DIAGNOSIS — E1122 Type 2 diabetes mellitus with diabetic chronic kidney disease: Secondary | ICD-10-CM | POA: Insufficient documentation

## 2023-03-16 DIAGNOSIS — D631 Anemia in chronic kidney disease: Secondary | ICD-10-CM | POA: Diagnosis not present

## 2023-03-16 DIAGNOSIS — Z992 Dependence on renal dialysis: Secondary | ICD-10-CM | POA: Diagnosis not present

## 2023-03-16 DIAGNOSIS — Z794 Long term (current) use of insulin: Secondary | ICD-10-CM | POA: Diagnosis not present

## 2023-03-16 DIAGNOSIS — Z79899 Other long term (current) drug therapy: Secondary | ICD-10-CM | POA: Insufficient documentation

## 2023-03-16 DIAGNOSIS — Z7982 Long term (current) use of aspirin: Secondary | ICD-10-CM | POA: Insufficient documentation

## 2023-03-16 DIAGNOSIS — N186 End stage renal disease: Secondary | ICD-10-CM | POA: Diagnosis present

## 2023-03-16 DIAGNOSIS — I509 Heart failure, unspecified: Secondary | ICD-10-CM | POA: Diagnosis not present

## 2023-03-16 DIAGNOSIS — J449 Chronic obstructive pulmonary disease, unspecified: Secondary | ICD-10-CM | POA: Insufficient documentation

## 2023-03-16 DIAGNOSIS — I132 Hypertensive heart and chronic kidney disease with heart failure and with stage 5 chronic kidney disease, or end stage renal disease: Secondary | ICD-10-CM | POA: Insufficient documentation

## 2023-03-16 LAB — CBC
HCT: 27.7 % — ABNORMAL LOW (ref 39.0–52.0)
Hemoglobin: 8.6 g/dL — ABNORMAL LOW (ref 13.0–17.0)
MCH: 28.1 pg (ref 26.0–34.0)
MCHC: 31 g/dL (ref 30.0–36.0)
MCV: 90.5 fL (ref 80.0–100.0)
Platelets: 139 10*3/uL — ABNORMAL LOW (ref 150–400)
RBC: 3.06 MIL/uL — ABNORMAL LOW (ref 4.22–5.81)
RDW: 13.7 % (ref 11.5–15.5)
WBC: 6.8 10*3/uL (ref 4.0–10.5)
nRBC: 0 % (ref 0.0–0.2)

## 2023-03-16 LAB — CBC WITH DIFFERENTIAL/PLATELET
Abs Immature Granulocytes: 0.1 10*3/uL — ABNORMAL HIGH (ref 0.00–0.07)
Basophils Absolute: 0 10*3/uL (ref 0.0–0.1)
Basophils Relative: 0 %
Eosinophils Absolute: 0.1 10*3/uL (ref 0.0–0.5)
Eosinophils Relative: 2 %
HCT: 26 % — ABNORMAL LOW (ref 39.0–52.0)
Hemoglobin: 8.5 g/dL — ABNORMAL LOW (ref 13.0–17.0)
Immature Granulocytes: 2 %
Lymphocytes Relative: 13 %
Lymphs Abs: 0.7 10*3/uL (ref 0.7–4.0)
MCH: 29 pg (ref 26.0–34.0)
MCHC: 32.7 g/dL (ref 30.0–36.0)
MCV: 88.7 fL (ref 80.0–100.0)
Monocytes Absolute: 0.4 10*3/uL (ref 0.1–1.0)
Monocytes Relative: 8 %
Neutro Abs: 4.2 10*3/uL (ref 1.7–7.7)
Neutrophils Relative %: 75 %
Platelets: 115 10*3/uL — ABNORMAL LOW (ref 150–400)
RBC: 2.93 MIL/uL — ABNORMAL LOW (ref 4.22–5.81)
RDW: 13.7 % (ref 11.5–15.5)
WBC: 5.5 10*3/uL (ref 4.0–10.5)
nRBC: 0 % (ref 0.0–0.2)

## 2023-03-16 LAB — BASIC METABOLIC PANEL
Anion gap: 18 — ABNORMAL HIGH (ref 5–15)
BUN: 84 mg/dL — ABNORMAL HIGH (ref 8–23)
CO2: 26 mmol/L (ref 22–32)
Calcium: 8.1 mg/dL — ABNORMAL LOW (ref 8.9–10.3)
Chloride: 93 mmol/L — ABNORMAL LOW (ref 98–111)
Creatinine, Ser: 5.44 mg/dL — ABNORMAL HIGH (ref 0.61–1.24)
GFR, Estimated: 11 mL/min — ABNORMAL LOW (ref >60)
Glucose, Bld: 192 mg/dL — ABNORMAL HIGH (ref 70–99)
Potassium: 3.4 mmol/L — ABNORMAL LOW (ref 3.5–5.1)
Sodium: 137 mmol/L (ref 135–145)

## 2023-03-16 LAB — RENAL FUNCTION PANEL
Albumin: 3.7 g/dL (ref 3.5–5.0)
Anion gap: 14 (ref 5–15)
BUN: 82 mg/dL — ABNORMAL HIGH (ref 8–23)
CO2: 30 mmol/L (ref 22–32)
Calcium: 8.3 mg/dL — ABNORMAL LOW (ref 8.9–10.3)
Chloride: 96 mmol/L — ABNORMAL LOW (ref 98–111)
Creatinine, Ser: 5.33 mg/dL — ABNORMAL HIGH (ref 0.61–1.24)
GFR, Estimated: 11 mL/min — ABNORMAL LOW (ref >60)
Glucose, Bld: 67 mg/dL — ABNORMAL LOW (ref 70–99)
Phosphorus: 5.1 mg/dL — ABNORMAL HIGH (ref 2.5–4.6)
Potassium: 3.2 mmol/L — ABNORMAL LOW (ref 3.5–5.1)
Sodium: 140 mmol/L (ref 135–145)

## 2023-03-16 MED ORDER — HEPARIN SODIUM (PORCINE) 1000 UNIT/ML DIALYSIS
1000.0000 [IU] | INTRAMUSCULAR | Status: DC | PRN
  Administered 2023-03-16: 1000 [IU]
  Filled 2023-03-16: qty 1

## 2023-03-16 MED ORDER — ANTICOAGULANT SODIUM CITRATE 4% (200MG/5ML) IV SOLN: 5.0000 mL | Status: DC | PRN

## 2023-03-16 MED ORDER — HEPARIN SODIUM (PORCINE) 1000 UNIT/ML DIALYSIS
2000.0000 [IU] | INTRAMUSCULAR | Status: DC | PRN
  Administered 2023-03-16: 2000 [IU] via INTRAVENOUS_CENTRAL
  Filled 2023-03-16: qty 2

## 2023-03-16 MED ORDER — ALTEPLASE 2 MG IJ SOLR: 2.0000 mg | Freq: Once | INTRAMUSCULAR | Status: DC | PRN

## 2023-03-16 MED ORDER — CHLORHEXIDINE GLUCONATE CLOTH 2 % EX PADS: 6.0000 | MEDICATED_PAD | Freq: Every day | CUTANEOUS | Status: DC

## 2023-03-16 MED ORDER — DARBEPOETIN ALFA 60 MCG/0.3ML IJ SOSY
60.0000 ug | PREFILLED_SYRINGE | Freq: Once | INTRAMUSCULAR | Status: AC
  Administered 2023-03-16: 60 ug via SUBCUTANEOUS
  Filled 2023-03-16: qty 0.3

## 2023-03-16 NOTE — ED Triage Notes (Signed)
Pt. Here for dialysis. Last treatment was Tuesday. Access in left subclavian

## 2023-03-16 NOTE — ED Provider Notes (Signed)
[place] EMERGENCY DEPARTMENT AT [facility] Provider Note   CSN: [id number] Arrival date & time: 03/16/23  4332    History  Chief Complaint  Patient presents with   Vascular Access Problem    Jeffrey [name] is a 62 y.o. male history of ESRD on HD, diabetes, hypertension, CHF, COPD here for evaluation of needing dialysis.  He has been dismissed from outpatient dialysis centers.  He comes emergency department to have this done.  Last dialyzed 03/14/2023.  He is on chronic oxygen.  He denies any current complaints.  He does occasionally make urine.  No chest pain, increasing shortness of breath, fever, edema  HPI     Home Medications Prior to Admission medications   Medication Sig Start Date End Date Taking? Authorizing Provider  amLODipine (NORVASC) 10 MG tablet Take 10 mg by mouth every morning. 04/17/20   [provider]  ASPIRIN LOW DOSE 81 MG EC tablet Take 81 mg by mouth in the morning. 02/22/21   [provider]  atorvastatin (LIPITOR) 40 MG tablet Take 40 mg by mouth in the morning. 02/09/21   [provider]  calcium acetate (PHOSLO) 667 MG capsule Take 2 capsules (1,334 mg total) by mouth with breakfast, with lunch, and with evening meal. 04/10/21   Calvert Cantor, MD  carvedilol (COREG) 12.5 MG tablet Take 12.5 mg by mouth 2 (two) times daily with a meal. 04/17/20   [provider]  Cholecalciferol (VITAMIN D) 50 MCG (2000 UT) CAPS Take 2,000 Units by mouth daily.    [provider]  clopidogrel (PLAVIX) 75 MG tablet Take 1 tablet (75 mg total) by mouth daily. 11/02/22   Rhyne, Ames Coupe, PA-C  cyclobenzaprine (FLEXERIL) 10 MG tablet Take 10 mg by mouth 3 (three) times daily as needed for muscle spasms. 12/29/21   [provider]  diazepam (VALIUM) 5 MG tablet Take 5 mg by mouth 2 (two) times daily as needed for muscle spasms. 12/09/21   [provider]  insulin regular human CONCENTRATED (HUMULIN R U-500  KWIKPEN) 500 UNIT/ML KwikPen Inject 45 Units into the skin daily with supper. Patient taking differently: Inject 125 Units into the skin 2 (two) times daily with a meal. 09/04/21 08/30/24  [name], MD  metolazone (ZAROXOLYN) 10 MG tablet Take 10 mg by mouth daily. 07/13/21   [provider]  nortriptyline (PAMELOR) 10 MG capsule Take 20 mg by mouth at bedtime. 10/23/19 08/30/24  [provider]  oxyCODONE (ROXICODONE) 5 MG immediate release tablet Take 1 tablet (5 mg total) by mouth every 8 (eight) hours as needed. Patient taking differently: Take 5 mg by mouth every 8 (eight) hours as needed for moderate pain. 12/31/22 12/31/23  [name], [name], DO  pantoprazole (PROTONIX) 40 MG tablet Take 40 mg by mouth daily before breakfast. 02/09/21   [provider]  pregabalin (LYRICA) 75 MG capsule Take 1 capsule (75 mg total) by mouth See admin instructions. Daily.  Give after dialysis on dialysis days. 01/21/23   [name], MD  sertraline (ZOLOFT) 100 MG tablet Take 100 mg by mouth in the morning. 02/22/21   [provider]  tadalafil (CIALIS) 20 MG tablet Take 20 mg by mouth daily as needed for erectile dysfunction. Patient not taking: Reported on 02/28/2023    [provider]  tamsulosin (FLOMAX) 0.4 MG CAPS capsule Take 1 capsule (0.4 mg total) by mouth daily. 11/10/21   [name], MD  torsemide (DEMADEX) 20 MG  tablet Take 120 mg by mouth 2 (two) times daily.    [provider]  traZODone (DESYREL) 150 MG tablet Take 150 mg by mouth at bedtime. 12/09/21   [provider]      Allergies    Actos [pioglitazone], Dexmedetomidine, Ibuprofen, Tomato, and Wellbutrin [bupropion]    Review of Systems   Review of Systems  Constitutional: Negative.   HENT: Negative.    Respiratory: Negative.    Cardiovascular: Negative.   Genitourinary: Negative.   Musculoskeletal: Negative.   Skin: Negative.   Neurological: Negative.   All other  systems reviewed and are negative.   Physical Exam Updated Vital Signs BP (!) 169/72 (BP Location: Left Arm) Comment (BP Location): forearm  Pulse 78   Temp (!) 96.9 F (36.1 C) (Axillary)   Resp 15   Ht  (1.651 m)   Wt 133 kg   SpO2 100%   BMI 48.79 kg/m  Physical Exam Vitals and nursing note reviewed.  Constitutional:      General: He is not in acute distress.    Appearance: He is well-developed. He is not ill-appearing, toxic-appearing or diaphoretic.  HENT:     Head: Atraumatic.  Eyes:     Pupils: Pupils are equal, round, and reactive to light.  Cardiovascular:     Rate and Rhythm: Normal rate and regular rhythm.     Pulses: Normal pulses.     Heart sounds: Normal heart sounds.  Pulmonary:     Effort: Pulmonary effort is normal. No respiratory distress.     Breath sounds: Normal breath sounds.     Comments: On [place] (baseline) Abdominal:     General: Bowel sounds are normal. There is no distension.     Palpations: Abdomen is soft.  Musculoskeletal:        General: Normal range of motion.     Cervical back: Normal range of motion and neck supple.     Comments: Left AKA R AV fistula, thrill  Skin:    General: Skin is warm and dry.     Capillary Refill: Capillary refill takes less than 2 seconds.  Neurological:     General: No focal deficit present.     Mental Status: He is alert and oriented to person, place, and time.     ED Results / Procedures / Treatments   Labs (all labs ordered are listed, but only abnormal results are displayed) Labs Reviewed  CBC WITH DIFFERENTIAL/PLATELET - Abnormal; Notable for the following components:      Result Value   RBC 2.93 (*)    Hemoglobin 8.5 (*)    HCT 26.0 (*)    Platelets 115 (*)    Abs Immature Granulocytes 0.10 (*)    All other components within normal limits  BASIC METABOLIC PANEL - Abnormal; Notable for the following components:   Potassium 3.4 (*)    Chloride 93 (*)    Glucose, Bld 192 (*)    BUN 84 (*)     Creatinine, Ser 5.44 (*)    Calcium 8.1 (*)    GFR, Estimated 11 (*)    Anion gap 18 (*)    All other components within normal limits  CBC - Abnormal; Notable for the following components:   RBC 3.06 (*)    Hemoglobin 8.6 (*)    HCT 27.7 (*)    Platelets 139 (*)    All other components within normal limits  RENAL FUNCTION PANEL - Abnormal; Notable for the following components:  Potassium 3.2 (*)    Chloride 96 (*)    Glucose, Bld 67 (*)    BUN 82 (*)    Creatinine, Ser 5.33 (*)    Calcium 8.3 (*)    Phosphorus 5.1 (*)    GFR, Estimated 11 (*)    All other components within normal limits    EKG None  Radiology No results found.  Procedures Procedures    Medications Ordered in ED Medications  Chlorhexidine Gluconate Cloth 2 % PADS 6 each (has no administration in time range)  heparin injection 1,000 Units (has no administration in time range)  anticoagulant sodium citrate solution 5 mL (has no administration in time range)  alteplase (CATHFLO ACTIVASE) injection 2 mg (has no administration in time range)  heparin injection 2,000 Units (has no administration in time range)  Darbepoetin Alfa (ARANESP) injection 60 mcg (has no administration in time range)    ED Course/ Medical Decision Making/ A&P Clinical Course as of 03/16/23 1521  Thu Mar 16, 2023  1149 Consult with Dr. Arlean Hopping.  Will dialyze patient [BH]    Clinical Course User Index [BH] Rodney Yera A, PA-C   62 year old multiple medical problems including ESRD on HD, hypertension, hyperlipidemia, CHF here for evaluation of requesting dialysis.  Unfortunately was dismissed from local dialysis center subsequently leading him to the emergency department for completion of this.  He denies any complaints.  He is on his chronic oxygen via nasal cannula.  He denies any increase in shortness of breath.  No abdominal pain, chest pain.  Plan on checking labs, touch base with nephrology  Labs personally viewed  and interpreted:  CBC without leukocytosis, hemoglobin 8.5 similar to prior Metabolic panel potassium 3.4, creatinine 5.44  Success with Dr. Arlean Hopping with nephrology.  Will plan to dialyze today.  Anticipate patient will likely be able to be discharged after dialysis                             Medical Decision Making Amount and/or Complexity of Data Reviewed External Data Reviewed: labs and notes. Labs: ordered. Decision-making details documented in ED Course.  Risk OTC drugs. Prescription drug management. Parenteral controlled substances. Decision regarding hospitalization. Diagnosis or treatment significantly limited by social determinants of health.          Final Clinical Impression(s) / ED Diagnoses Final diagnoses:  ESRD (end stage renal disease)  Admission for dialysis  Anemia due to chronic kidney disease, on chronic dialysis    Rx / DC Orders ED Discharge Orders     None         Loyal Rudy A, PA-C 03/16/23 1521    Arby Barrette, MD 03/16/23 1556

## 2023-03-16 NOTE — Progress Notes (Signed)
POST HD TX NOTE  03/16/23 1609  Vitals  Temp 97.8 F (36.6 C)  Temp Source Oral  BP (!) 171/77  MAP (mmHg) 101  BP Location Left Arm (forearm)  BP Method Automatic  Patient Position (if appropriate) Lying  Pulse Rate 75  Pulse Rate Source Monitor  ECG Heart Rate 76  Resp 12  Oxygen Therapy  SpO2 100 %  O2 Device Nasal Cannula  O2 Flow Rate (L/min) 3 L/min  Pulse Oximetry Type Continuous  During Treatment Monitoring  Intra-Hemodialysis Comments (S)   (post HD tx VS check)  Post Treatment  Dialyzer Clearance Lightly streaked  Duration of HD Treatment -hour(s) 2.5 hour(s) (2 hr 23 min)  Hemodialysis Intake (mL) 0 mL  Liters Processed 57.1  Fluid Removed (mL) 2100 mL  Tolerated HD Treatment Yes  Post-Hemodialysis Comments (S)  tx ended 52 min early d/t pt needing to catch his ride, UF goal not met, blood rinsed back, VSS. Medication Admin: Heparin 2000 units bolus at beginning of tx, Heparin 2000 units bolus at approximate half way point of tx, Heparin Dwells 3800 units, Aranesp SQ  Hemodialysis Catheter Left Internal jugular Double lumen Permanent (Tunneled)  Placement Date/Time: 03/03/23 0856   Serial / Lot #: [id number]  Expiration Date: 08/30/27  Time Out: Correct patient;Correct site;Correct procedure  Maximum sterile barrier precautions: Hand hygiene;Cap;Mask;Sterile gown;Sterile gloves;Large sterile ...  Site Condition No complications  Blue Lumen Status Heparin locked;Dead end cap in place  Red Lumen Status Heparin locked;Dead end cap in place  Purple Lumen Status N/A  Catheter fill solution Heparin 1000 units/ml  Catheter fill volume (Arterial) 1.9 cc  Catheter fill volume (Venous) 1.9  Dressing Type Transparent  Dressing Status Antimicrobial disc in place;Clean, Dry, Intact  Drainage Description None  Dressing Change Due 03/21/23  Post treatment catheter status Capped and Clamped

## 2023-03-16 NOTE — ED Provider Notes (Signed)
  [place] EMERGENCY DEPARTMENT AT [facility] Transfer of Care Note I assumed care of Jeffrey [name] on 03/16/2023 at 3 PM from [place], [place].  Briefly, Jeffrey [name] is a 62 y.o. male who: PMHx: ESRD on IHD P/w missed dialysis, no other complaints   Plan at the time of handoff: Patient in dialysis at the time of handoff, reevaluate when returns to ED, anticipate discharge   Please refer to the original provider's note for additional information regarding the care of [name].  Reassessment:   Vital Signs:  ED Triage Vitals  Enc Vitals Group     BP 03/16/23 0806 135/65     Pulse Rate 03/16/23 0806 79     Resp 03/16/23 1315 13     Temp 03/16/23 1156 (!) 97.4 F (36.3 C)     Temp Source 03/16/23 1156 Oral     SpO2 03/16/23 0806 99 %     Weight 03/16/23 0808 293 lb 3.4 oz (133 kg)     Height 03/16/23 0808  (1.651 m)     Head Circumference --      Peak Flow --      Pain Score 03/16/23 0808 0     Pain Loc --      Pain Edu? --      Excl. in GC? --       Additional MDM: Came back from dialysis, eloped immediately from the ED, nursing reports that patient immediately got into his wheelchair upon arrival in the ED and wheeled himself out.  Reports that this is a frequent occurrence for the patient status post dialysis.  Did not personally reevaluate patient after dialysis given he was not present in the bed.  Reviewed patient's labs received prior to dialysis, no emergent derangements that would necessitate emergent call back to the ED.  Disposition: Eloped  Patient seen in conjunction with Dr. Olivia Canter, MD Emergency Medicine, PGY-2    Curley Spice, MD 03/16/23 1703    Virgina Norfolk, DO 03/16/23 [phone]

## 2023-03-16 NOTE — Procedures (Signed)
We are asked to see this patient for dialysis. Pt was discharged from his OP HD unit in December 2023 due to behavioral issues. Pt is here in ED requesting hospital dialysis. Plan is for HD upstairs then return to ED for reassessment and expected discharge home.    Last hep B labs here were from 03/11/23 Last darbe/ esa order was for 60 mcg darbe on 11/29/22, but pt didn't get it likely cause it was ordered "SQ at 1800" and he leaves before 6 pm. Thanks to pharmacy for the info.  Will give 1 dose darbe sq today.     Date                Tsat/ ferritin     IV Fe dose       Esa dose         Ca/alb/ phos/ pth   12/02/22            22%/ 935   01/24/23          31%/ 1056                                                       - / - / - /253   03/14/23                                                                                  7.9/3.5/4.4/ -   03/16/23                                                          Darbe 60 mcg   Last OP HD orders from dec 2023:  3:15h  129kg  RUE AVG   Heparin 2000            I was present at this dialysis session, have reviewed the session itself and made  appropriate changes Vinson Moselle MD [facility] pager [phone]   03/16/2023, 2:41 PM     Last Labs       Recent Labs  Lab 03/11/23 1037 03/14/23 1239 03/16/23 0818  HGB 8.6* 8.1* 8.5*  ALBUMIN 3.7 3.5  --   CALCIUM 8.6* 7.9* 8.1*  PHOS 3.8 4.4  --   CREATININE 5.07* 5.37* 5.44*  K 3.7 3.1* 3.4*        Inpatient medications:

## 2023-03-18 ENCOUNTER — Encounter (HOSPITAL_COMMUNITY): Payer: Self-pay | Admitting: *Deleted

## 2023-03-18 ENCOUNTER — Emergency Department (HOSPITAL_COMMUNITY)
Admission: EM | Admit: 2023-03-18 | Discharge: 2023-03-18 | Disposition: A | Discharge status: Home / Self Care | Payer: 59 | Attending: Emergency Medicine | Admitting: Emergency Medicine

## 2023-03-18 ENCOUNTER — Other Ambulatory Visit: Payer: Self-pay

## 2023-03-18 DIAGNOSIS — E1122 Type 2 diabetes mellitus with diabetic chronic kidney disease: Secondary | ICD-10-CM | POA: Diagnosis not present

## 2023-03-18 DIAGNOSIS — Z794 Long term (current) use of insulin: Secondary | ICD-10-CM | POA: Insufficient documentation

## 2023-03-18 DIAGNOSIS — Z7982 Long term (current) use of aspirin: Secondary | ICD-10-CM | POA: Insufficient documentation

## 2023-03-18 DIAGNOSIS — Z7902 Long term (current) use of antithrombotics/antiplatelets: Secondary | ICD-10-CM | POA: Diagnosis not present

## 2023-03-18 DIAGNOSIS — Z992 Dependence on renal dialysis: Secondary | ICD-10-CM | POA: Diagnosis not present

## 2023-03-18 DIAGNOSIS — N186 End stage renal disease: Secondary | ICD-10-CM | POA: Insufficient documentation

## 2023-03-18 DIAGNOSIS — Z79899 Other long term (current) drug therapy: Secondary | ICD-10-CM | POA: Insufficient documentation

## 2023-03-18 DIAGNOSIS — I132 Hypertensive heart and chronic kidney disease with heart failure and with stage 5 chronic kidney disease, or end stage renal disease: Secondary | ICD-10-CM | POA: Diagnosis present

## 2023-03-18 DIAGNOSIS — J449 Chronic obstructive pulmonary disease, unspecified: Secondary | ICD-10-CM | POA: Diagnosis not present

## 2023-03-18 DIAGNOSIS — I509 Heart failure, unspecified: Secondary | ICD-10-CM | POA: Diagnosis not present

## 2023-03-18 MED ORDER — ALTEPLASE 2 MG IJ SOLR: 2.0000 mg | Freq: Once | INTRAMUSCULAR | Status: DC | PRN

## 2023-03-18 MED ORDER — HEPARIN SODIUM (PORCINE) 1000 UNIT/ML DIALYSIS: 1000.0000 [IU] | INTRAMUSCULAR | Status: DC | PRN

## 2023-03-18 MED ORDER — PENTAFLUOROPROP-TETRAFLUOROETH EX AERO: 1.0000 | INHALATION_SPRAY | CUTANEOUS | Status: DC | PRN

## 2023-03-18 MED ORDER — LIDOCAINE-PRILOCAINE 2.5-2.5 % EX CREA: 1.0000 | TOPICAL_CREAM | CUTANEOUS | Status: DC | PRN

## 2023-03-18 MED ORDER — LIDOCAINE HCL (PF) 1 % IJ SOLN: 5.0000 mL | INTRAMUSCULAR | Status: DC | PRN

## 2023-03-18 MED ORDER — CHLORHEXIDINE GLUCONATE CLOTH 2 % EX PADS: 6.0000 | MEDICATED_PAD | Freq: Every day | CUTANEOUS | Status: DC

## 2023-03-18 NOTE — ED Notes (Signed)
Patient transported to dialysis

## 2023-03-18 NOTE — ED Notes (Signed)
Pt assessed by provider 

## 2023-03-18 NOTE — ED Provider Notes (Signed)
  Physical Exam  BP 129/63 (BP Location: Left Arm)   Pulse 78   Temp 98 F (36.7 C) (Oral)   Resp 13   Ht  (1.651 m)   Wt 133 kg   SpO2 100%   BMI 48.79 kg/m   Physical Exam Vitals and nursing note reviewed.  Constitutional:      General: He is not in acute distress.    Appearance: Normal appearance. He is normal weight. He is not ill-appearing.  HENT:     Head: Normocephalic and atraumatic.  Pulmonary:     Effort: Pulmonary effort is normal. No respiratory distress.  Musculoskeletal:        General: Normal range of motion.     Cervical back: Neck supple.  Skin:    General: Skin is warm and dry.  Neurological:     Mental Status: He is alert and oriented to person, place, and time.  Psychiatric:        Mood and Affect: Mood normal.        Behavior: Behavior normal.     Procedures  Procedures  ED Course / MDM    Medical Decision Making Assumed care after patient returned from dialysis.  States he was feeling fine prior to dialysis, feels the same.  Has no complaints.  Would like to be discharged home.  Comes to the ED for dialysis as he has been dismissed from other dialysis centers.  He is declining discharge paperwork and would like to be discharged to soon as possible.  At this time there does not appear to be any evidence of an acute emergency medical condition and the patient appears stable for discharge with appropriate outpatient follow up. All questions answered.  Note: Portions of this report may have been transcribed using voice recognition software. Every effort was made to ensure accuracy; however, inadvertent computerized transcription errors may still be present.        Michelle Piper, [name] 03/18/23 1643    Kommor, Wyn Forster, MD 03/19/23 [phone]

## 2023-03-18 NOTE — ED Triage Notes (Signed)
Patient is here for his routine dialysis treatment. No complaints.

## 2023-03-18 NOTE — Procedures (Signed)
   I was present at this dialysis session, have reviewed the session itself and made  appropriate changes Vinson Moselle MD [facility] pager [phone]   03/18/2023, 2:44 PM

## 2023-03-18 NOTE — ED Provider Notes (Signed)
[place] EMERGENCY DEPARTMENT AT [facility] Provider Note   CSN: [id number] Arrival date & time: 03/18/23  4098    History  No chief complaint on file.   Jeffrey [name] is a 62 y.o. male history of ESRD on HD, diabetes, hypertension, CHF, COPD here for evaluation of needing dialysis.  He has been dismissed from outpatient dialysis centers.  He comes emergency department to have this done.  Last dialyzed 03/16/2023.  He is on chronic oxygen.  He denies any current complaints.  He does occasionally make urine.  No chest pain, increasing shortness of breath, fever, edema  HPI     Home Medications Prior to Admission medications   Medication Sig Start Date End Date Taking? Authorizing Provider  amLODipine (NORVASC) 10 MG tablet Take 10 mg by mouth every morning. 04/17/20   [provider]  ASPIRIN LOW DOSE 81 MG EC tablet Take 81 mg by mouth in the morning. 02/22/21   [provider]  atorvastatin (LIPITOR) 40 MG tablet Take 40 mg by mouth in the morning. 02/09/21   [provider]  calcium acetate (PHOSLO) 667 MG capsule Take 2 capsules (1,334 mg total) by mouth with breakfast, with lunch, and with evening meal. 04/10/21   Calvert Cantor, MD  carvedilol (COREG) 12.5 MG tablet Take 12.5 mg by mouth 2 (two) times daily with a meal. 04/17/20   [provider]  Cholecalciferol (VITAMIN D) 50 MCG (2000 UT) CAPS Take 2,000 Units by mouth daily.    [provider]  clopidogrel (PLAVIX) 75 MG tablet Take 1 tablet (75 mg total) by mouth daily. 11/02/22   Rhyne, Ames Coupe, PA-C  cyclobenzaprine (FLEXERIL) 10 MG tablet Take 10 mg by mouth 3 (three) times daily as needed for muscle spasms. 12/29/21   [provider]  diazepam (VALIUM) 5 MG tablet Take 5 mg by mouth 2 (two) times daily as needed for muscle spasms. 12/09/21   [provider]  insulin regular human CONCENTRATED (HUMULIN R U-500 KWIKPEN) 500 UNIT/ML KwikPen Inject 45 Units  into the skin daily with supper. Patient taking differently: Inject 125 Units into the skin 2 (two) times daily with a meal. 09/04/21 08/30/24  [name], MD  metolazone (ZAROXOLYN) 10 MG tablet Take 10 mg by mouth daily. 07/13/21   [provider]  nortriptyline (PAMELOR) 10 MG capsule Take 20 mg by mouth at bedtime. 10/23/19 08/30/24  [provider]  oxyCODONE (ROXICODONE) 5 MG immediate release tablet Take 1 tablet (5 mg total) by mouth every 8 (eight) hours as needed. Patient taking differently: Take 5 mg by mouth every 8 (eight) hours as needed for moderate pain. 12/31/22 12/31/23  [name], [name], DO  pantoprazole (PROTONIX) 40 MG tablet Take 40 mg by mouth daily before breakfast. 02/09/21   [provider]  pregabalin (LYRICA) 75 MG capsule Take 1 capsule (75 mg total) by mouth See admin instructions. Daily.  Give after dialysis on dialysis days. 01/21/23   [name], MD  sertraline (ZOLOFT) 100 MG tablet Take 100 mg by mouth in the morning. 02/22/21   [provider]  tadalafil (CIALIS) 20 MG tablet Take 20 mg by mouth daily as needed for erectile dysfunction. Patient not taking: Reported on 02/28/2023    [provider]  tamsulosin (FLOMAX) 0.4 MG CAPS capsule Take 1 capsule (0.4 mg total) by mouth daily. 11/10/21   [name], MD  torsemide (DEMADEX) 20 MG tablet Take 120 mg by mouth 2 (  two) times daily.    [provider]  traZODone (DESYREL) 150 MG tablet Take 150 mg by mouth at bedtime. 12/09/21   [provider]      Allergies    Actos [pioglitazone], Dexmedetomidine, Ibuprofen, Tomato, and Wellbutrin [bupropion]    Review of Systems   Review of Systems  Constitutional: Negative.   HENT: Negative.    Respiratory: Negative.    Cardiovascular: Negative.   Genitourinary: Negative.   Musculoskeletal: Negative.   Skin: Negative.   Neurological: Negative.   All other systems reviewed and are negative.   Physical  Exam Updated Vital Signs There were no vitals taken for this visit. Physical Exam Vitals and nursing note reviewed.  Constitutional:      General: He is not in acute distress.    Appearance: He is well-developed. He is not ill-appearing, toxic-appearing or diaphoretic.  HENT:     Head: Atraumatic.  Eyes:     Pupils: Pupils are equal, round, and reactive to light.  Cardiovascular:     Rate and Rhythm: Normal rate and regular rhythm.     Pulses: Normal pulses.     Heart sounds: Normal heart sounds.  Pulmonary:     Effort: Pulmonary effort is normal. No respiratory distress.     Breath sounds: Normal breath sounds.     Comments: On [place] (baseline) Abdominal:     General: Bowel sounds are normal. There is no distension.     Palpations: Abdomen is soft.  Musculoskeletal:        General: Normal range of motion.     Cervical back: Normal range of motion and neck supple.     Comments: Left AKA R AV fistula, thrill  Skin:    General: Skin is warm and dry.     Capillary Refill: Capillary refill takes less than 2 seconds.  Neurological:     General: No focal deficit present.     Mental Status: He is alert and oriented to person, place, and time.     ED Results / Procedures / Treatments   Labs (all labs ordered are listed, but only abnormal results are displayed) Labs Reviewed - No data to display   EKG None  Radiology No results found.  Procedures Procedures    Medications Ordered in ED Medications - No data to display   ED Course/ Medical Decision Making/ A&P    Patient presents to the emergency department complaining of needing dialysis.  Patient with no new complaints at this time. There is no indication at this time for emergent labs, imaging, or workup.   Patient case discussed with Dr.Schertz of nephrology who will arrange for hemodialysis this morning. Anticipate discharge once patient returns to the emergency department.                               Medical Decision Making Amount and/or Complexity of Data Reviewed External Data Reviewed: labs and notes. Labs: ordered. Decision-making details documented in ED Course.  Risk OTC drugs. Prescription drug management. Parenteral controlled substances. Decision regarding hospitalization. Diagnosis or treatment significantly limited by social determinants of health.          Final Clinical Impression(s) / ED Diagnoses Final diagnoses:  None    Rx / DC Orders ED Discharge Orders     None         Henderly, Britni A, PA-C 03/16/23 1521    Arby Barrette, MD 03/16/23 1556  Darrick Grinder, PA-C 03/18/23 0901    Elayne Snare K, DO 03/18/23 1545

## 2023-03-18 NOTE — Progress Notes (Signed)
We are asked to see Jeffrey [name] for hemodialysis needs. Patient discharged from his OP HD unit in December 2023 due to behavioral issues. Plan is for HD upstairs then return to ED for reassessment and expected discharge home.  Last OP HD orders from dec 2023:  3:15h  129kg  RUE AVG   Heparin 2000   Last Darbe injection on 03/16/23  Hep B Sag non-reactive on 03/11/23 Hep B S AB 34.9 03/11/23  Salome Holmes, NP  [facility]

## 2023-03-18 NOTE — Discharge Instructions (Signed)
You are seen here today for dialysis.  You should follow-up with your nephrologist and attempt to obtain care from the dialysis center.  Return to the ED if you have any new or worsening symptoms.

## 2023-03-21 ENCOUNTER — Emergency Department (HOSPITAL_COMMUNITY)
Admission: EM | Admit: 2023-03-21 | Discharge: 2023-03-21 | Disposition: A | Discharge status: Home / Self Care | Payer: 59 | Attending: Emergency Medicine | Admitting: Emergency Medicine

## 2023-03-21 ENCOUNTER — Other Ambulatory Visit: Payer: Self-pay

## 2023-03-21 DIAGNOSIS — Z79899 Other long term (current) drug therapy: Secondary | ICD-10-CM | POA: Diagnosis not present

## 2023-03-21 DIAGNOSIS — Z7982 Long term (current) use of aspirin: Secondary | ICD-10-CM | POA: Insufficient documentation

## 2023-03-21 DIAGNOSIS — N186 End stage renal disease: Secondary | ICD-10-CM | POA: Insufficient documentation

## 2023-03-21 DIAGNOSIS — Z992 Dependence on renal dialysis: Secondary | ICD-10-CM | POA: Diagnosis not present

## 2023-03-21 DIAGNOSIS — J449 Chronic obstructive pulmonary disease, unspecified: Secondary | ICD-10-CM | POA: Diagnosis not present

## 2023-03-21 DIAGNOSIS — I509 Heart failure, unspecified: Secondary | ICD-10-CM | POA: Diagnosis not present

## 2023-03-21 DIAGNOSIS — Z7902 Long term (current) use of antithrombotics/antiplatelets: Secondary | ICD-10-CM | POA: Insufficient documentation

## 2023-03-21 DIAGNOSIS — Z794 Long term (current) use of insulin: Secondary | ICD-10-CM | POA: Diagnosis not present

## 2023-03-21 DIAGNOSIS — I132 Hypertensive heart and chronic kidney disease with heart failure and with stage 5 chronic kidney disease, or end stage renal disease: Secondary | ICD-10-CM | POA: Insufficient documentation

## 2023-03-21 DIAGNOSIS — E1122 Type 2 diabetes mellitus with diabetic chronic kidney disease: Secondary | ICD-10-CM | POA: Insufficient documentation

## 2023-03-21 LAB — CBC
HCT: 25.4 % — ABNORMAL LOW (ref 39.0–52.0)
Hemoglobin: 8.2 g/dL — ABNORMAL LOW (ref 13.0–17.0)
MCH: 29.2 pg (ref 26.0–34.0)
MCHC: 32.3 g/dL (ref 30.0–36.0)
MCV: 90.4 fL (ref 80.0–100.0)
Platelets: 108 10*3/uL — ABNORMAL LOW (ref 150–400)
RBC: 2.81 MIL/uL — ABNORMAL LOW (ref 4.22–5.81)
RDW: 13.7 % (ref 11.5–15.5)
WBC: 5.3 10*3/uL (ref 4.0–10.5)
nRBC: 0 % (ref 0.0–0.2)

## 2023-03-21 LAB — RENAL FUNCTION PANEL
Albumin: 3.5 g/dL (ref 3.5–5.0)
Anion gap: 13 (ref 5–15)
BUN: 67 mg/dL — ABNORMAL HIGH (ref 8–23)
CO2: 28 mmol/L (ref 22–32)
Calcium: 8.1 mg/dL — ABNORMAL LOW (ref 8.9–10.3)
Chloride: 93 mmol/L — ABNORMAL LOW (ref 98–111)
Creatinine, Ser: 4.94 mg/dL — ABNORMAL HIGH (ref 0.61–1.24)
GFR, Estimated: 13 mL/min — ABNORMAL LOW (ref >60)
Glucose, Bld: 360 mg/dL — ABNORMAL HIGH (ref 70–99)
Phosphorus: 3.7 mg/dL (ref 2.5–4.6)
Potassium: 3.8 mmol/L (ref 3.5–5.1)
Sodium: 134 mmol/L — ABNORMAL LOW (ref 135–145)

## 2023-03-21 LAB — HEPATITIS B SURFACE ANTIGEN: Hepatitis B Surface Ag: NONREACTIVE

## 2023-03-21 MED ORDER — ALTEPLASE 2 MG IJ SOLR
2.0000 mg | Freq: Once | INTRAMUSCULAR | Status: AC | PRN
  Administered 2023-03-21: 2 mg
  Filled 2023-03-21: qty 2

## 2023-03-21 MED ORDER — PENTAFLUOROPROP-TETRAFLUOROETH EX AERO: 1.0000 | INHALATION_SPRAY | CUTANEOUS | Status: DC | PRN

## 2023-03-21 MED ORDER — ANTICOAGULANT SODIUM CITRATE 4% (200MG/5ML) IV SOLN: 5.0000 mL | Status: DC | PRN

## 2023-03-21 MED ORDER — HEPARIN SODIUM (PORCINE) 1000 UNIT/ML DIALYSIS
2000.0000 [IU] | INTRAMUSCULAR | Status: DC | PRN
  Administered 2023-03-21: 2000 [IU] via INTRAVENOUS_CENTRAL
  Filled 2023-03-21: qty 2

## 2023-03-21 MED ORDER — LIDOCAINE HCL (PF) 1 % IJ SOLN: 5.0000 mL | INTRAMUSCULAR | Status: DC | PRN

## 2023-03-21 MED ORDER — HEPARIN SODIUM (PORCINE) 1000 UNIT/ML DIALYSIS: 1000.0000 [IU] | INTRAMUSCULAR | Status: DC | PRN

## 2023-03-21 MED ORDER — LIDOCAINE-PRILOCAINE 2.5-2.5 % EX CREA: 1.0000 | TOPICAL_CREAM | CUTANEOUS | Status: DC | PRN

## 2023-03-21 MED ORDER — CHLORHEXIDINE GLUCONATE CLOTH 2 % EX PADS: 6.0000 | MEDICATED_PAD | Freq: Every day | CUTANEOUS | Status: DC

## 2023-03-21 NOTE — Progress Notes (Signed)
Unable to run HD tx today d/t HD cath problems. Arterial port had trouble pulling back, drag...Marland Kitchenflushed ok, Venous port will not pull at all but flushes ok. Connected to lines and tried to run to see if it would, just in case. Tx never started. W/in 3 mins (approximately) the arterial pressure hit -510. W/ all the trouble w/ drag on the pull of the AP, and no way to reverse it b/c the vp won't pull at all, I immediately rinsed pt back. TPA was instilled in dwells. Encouraged pt to come back tomorrow but he flat out said, "no! I will see you Thursday". Zeyfang, PA was notified and made aware that tx couldn't be ran today and pt had TPA instilled in dwells. This was done through secure chat. He replied, "ok". Pt was sent back to the ED for d/c.

## 2023-03-21 NOTE — ED Triage Notes (Signed)
Patient here for routine dialysis and denies complaints.

## 2023-03-21 NOTE — ED Notes (Signed)
Breakfast tray called in will be up at 9:30am

## 2023-03-21 NOTE — ED Provider Notes (Addendum)
[place] EMERGENCY DEPARTMENT AT [facility] Provider Note   CSN: [id number] Arrival date & time: 03/21/23  8413     History  No chief complaint on file.   Jeffrey [name] is a 62 y.o. male.  Pt is a 62 yo male with pmhx significant for ESRD on HD, DM, HTN, CHF, COPD, and PTSD.  Pt has been dismissed from his outpatient dialysis centers and has been coming to the ED for dialysis.  His last session was on 4/20.  He has no complaints today other than some mild sob with getting from his wheelchair to the bed.  Access is via port in chest.         Home Medications Prior to Admission medications   Medication Sig Start Date End Date Taking? Authorizing Provider  amLODipine (NORVASC) 10 MG tablet Take 10 mg by mouth every morning. 04/17/20   [provider]  ASPIRIN LOW DOSE 81 MG EC tablet Take 81 mg by mouth in the morning. 02/22/21   [provider]  atorvastatin (LIPITOR) 40 MG tablet Take 40 mg by mouth in the morning. 02/09/21   [provider]  calcium acetate (PHOSLO) 667 MG capsule Take 2 capsules (1,334 mg total) by mouth with breakfast, with lunch, and with evening meal. 04/10/21   Calvert Cantor, MD  carvedilol (COREG) 12.5 MG tablet Take 12.5 mg by mouth 2 (two) times daily with a meal. 04/17/20   [provider]  Cholecalciferol (VITAMIN D) 50 MCG (2000 UT) CAPS Take 2,000 Units by mouth daily.    [provider]  clopidogrel (PLAVIX) 75 MG tablet Take 1 tablet (75 mg total) by mouth daily. 11/02/22   Rhyne, Ames Coupe, PA-C  cyclobenzaprine (FLEXERIL) 10 MG tablet Take 10 mg by mouth 3 (three) times daily as needed for muscle spasms. 12/29/21   [provider]  diazepam (VALIUM) 5 MG tablet Take 5 mg by mouth 2 (two) times daily as needed for muscle spasms. 12/09/21   [provider]  insulin regular human CONCENTRATED (HUMULIN R U-500 KWIKPEN) 500 UNIT/ML KwikPen Inject 45 Units into the skin daily with  supper. Patient taking differently: Inject 125 Units into the skin 2 (two) times daily with a meal. 09/04/21 08/30/24  [name], MD  metolazone (ZAROXOLYN) 10 MG tablet Take 10 mg by mouth daily. 07/13/21   [provider]  nortriptyline (PAMELOR) 10 MG capsule Take 20 mg by mouth at bedtime. 10/23/19 08/30/24  [provider]  oxyCODONE (ROXICODONE) 5 MG immediate release tablet Take 1 tablet (5 mg total) by mouth every 8 (eight) hours as needed. Patient taking differently: Take 5 mg by mouth every 8 (eight) hours as needed for moderate pain. 12/31/22 12/31/23  [name], [name], DO  pantoprazole (PROTONIX) 40 MG tablet Take 40 mg by mouth daily before breakfast. 02/09/21   [provider]  pregabalin (LYRICA) 75 MG capsule Take 1 capsule (75 mg total) by mouth See admin instructions. Daily.  Give after dialysis on dialysis days. 01/21/23   [name], MD  sertraline (ZOLOFT) 100 MG tablet Take 100 mg by mouth in the morning. 02/22/21   [provider]  tadalafil (CIALIS) 20 MG tablet Take 20 mg by mouth daily as needed for erectile dysfunction. Patient not taking: Reported on 02/28/2023    [provider]  tamsulosin (FLOMAX) 0.4 MG CAPS capsule Take 1 capsule (0.4 mg total) by mouth daily. 11/10/21   [name], MD  torsemide (  DEMADEX) 20 MG tablet Take 120 mg by mouth 2 (two) times daily.    [provider]  traZODone (DESYREL) 150 MG tablet Take 150 mg by mouth at bedtime. 12/09/21   [provider]      Allergies    Actos [pioglitazone], Dexmedetomidine, Ibuprofen, Tomato, and Wellbutrin [bupropion]    Review of Systems   Review of Systems  All other systems reviewed and are negative.   Physical Exam Updated Vital Signs BP (!) 161/79 (BP Location: Right Arm)   Pulse 83   Temp 97.9 F (36.6 C) (Oral)   Resp 14   SpO2 100%  Physical Exam Vitals and nursing note reviewed.  Constitutional:      Appearance: Normal  appearance. He is obese.  HENT:     Head: Normocephalic and atraumatic.     Right Ear: External ear normal.     Left Ear: External ear normal.     Nose: Nose normal.     Mouth/Throat:     Mouth: Mucous membranes are moist.     Pharynx: Oropharynx is clear.  Eyes:     Extraocular Movements: Extraocular movements intact.     Conjunctiva/sclera: Conjunctivae normal.     Pupils: Pupils are equal, round, and reactive to light.  Cardiovascular:     Rate and Rhythm: Normal rate and regular rhythm.     Pulses: Normal pulses.     Heart sounds: Normal heart sounds.  Pulmonary:     Effort: Pulmonary effort is normal.     Breath sounds: Normal breath sounds.  Abdominal:     General: Abdomen is flat. Bowel sounds are normal.     Palpations: Abdomen is soft.  Musculoskeletal:     Cervical back: Normal range of motion and neck supple.     Comments: Left aka  Skin:    General: Skin is warm.     Capillary Refill: Capillary refill takes less than 2 seconds.  Neurological:     General: No focal deficit present.     Mental Status: He is alert and oriented to person, place, and time.  Psychiatric:        Mood and Affect: Mood normal.        Behavior: Behavior normal.     ED Results / Procedures / Treatments   Labs (all labs ordered are listed, but only abnormal results are displayed) Labs Reviewed  RENAL FUNCTION PANEL - Abnormal; Notable for the following components:      Result Value   Sodium 134 (*)    Chloride 93 (*)    Glucose, Bld 360 (*)    BUN 67 (*)    Creatinine, Ser 4.94 (*)    Calcium 8.1 (*)    GFR, Estimated 13 (*)    All other components within normal limits  CBC - Abnormal; Notable for the following components:   RBC 2.81 (*)    Hemoglobin 8.2 (*)    HCT 25.4 (*)    Platelets 108 (*)    All other components within normal limits  HEPATITIS B SURFACE ANTIGEN  HEPATITIS B SURFACE ANTIBODY, QUANTITATIVE    EKG None  Radiology No results  found.  Procedures Procedures    Medications Ordered in ED Medications  Chlorhexidine Gluconate Cloth 2 % PADS 6 each (has no administration in time range)  pentafluoroprop-tetrafluoroeth (GEBAUERS) aerosol 1 Application (has no administration in time range)  lidocaine (PF) (XYLOCAINE) 1 % injection 5 mL (has no administration in time range)  lidocaine-prilocaine (  EMLA) cream 1 Application (has no administration in time range)  heparin injection 1,000 Units (has no administration in time range)  anticoagulant sodium citrate solution 5 mL (has no administration in time range)  alteplase (CATHFLO ACTIVASE) injection 2 mg (has no administration in time range)  heparin injection 2,000 Units (has no administration in time range)    ED Course/ Medical Decision Making/ A&P                             Medical Decision Making  Pt is here for dialysis.  He has no new complaints.  He is d/w Dr. Kathrene Bongo who will get him to dialysis as soon as possible.  Unfortunately, pt's catheter was clotted. Dialysis put tpa in it, but could not get it to work.  Pt was sent back to the ED.  Pt said he has an appt with Dr. Randie Heinz in the am.  He did not want to wait for his d/c papers.  Pt showed himself out.      Final Clinical Impression(s) / ED Diagnoses Final diagnoses:  ESRD on hemodialysis    Rx / DC Orders ED Discharge Orders     None         Jacalyn Lefevre, MD 03/21/23 1333    Jacalyn Lefevre, MD 03/21/23 1428

## 2023-03-21 NOTE — ED Notes (Signed)
Patient provided breakfast and continuing to await on dialysis

## 2023-03-22 ENCOUNTER — Ambulatory Visit (INDEPENDENT_AMBULATORY_CARE_PROVIDER_SITE_OTHER): Payer: 59 | Admitting: Vascular Surgery

## 2023-03-22 ENCOUNTER — Encounter: Payer: Self-pay | Admitting: Vascular Surgery

## 2023-03-22 VITALS — BP 158/73 | HR 80 | Temp 98.2°F | Resp 20 | Ht 65.0 in | Wt 293.0 lb

## 2023-03-22 DIAGNOSIS — Z992 Dependence on renal dialysis: Secondary | ICD-10-CM

## 2023-03-22 DIAGNOSIS — N186 End stage renal disease: Secondary | ICD-10-CM

## 2023-03-22 LAB — HEPATITIS B SURFACE ANTIBODY, QUANTITATIVE: Hep B S AB Quant (Post): 36.2 m[IU]/mL (ref >9.9)

## 2023-03-22 NOTE — Progress Notes (Signed)
Patient ID: Jeffrey [name], male   DOB: [birth year], 62 y.o.   MRN: [id number]  Reason for Consult: Follow-up   Referred by Knox Royalty, MD  Subjective:     HPI:  Jeffrey [name] is a 62 y.o. male has a history of end-stage renal disease currently on dialysis via left IJ tunneled dialysis catheter.  He recently had a right arm AV graft ligated for severe swelling.  He still has some swelling in his right upper extremity.  He has a previous fistula placed in his left arm 4 years ago which never worked and was quickly abandoned and moved to right arm AV graft.  He is right-hand dominant.  He does not take any blood thinners.  Past Medical History:  Diagnosis Date   Altered mental status 05/04/2021   Arthritis    Blood transfusion without reported diagnosis    CHF (congestive heart failure)    COPD (chronic obstructive pulmonary disease)    Depression    Diabetes mellitus without complication    type 2   Dyspnea    ESRD on hemodialysis    Tues Thurs Sat   GERD (gastroesophageal reflux disease)    protonix   Heart murmur    as a child, no problems   Hypertension    Myocardial infarction    PTSD (post-traumatic stress disorder)    Sepsis    Sleep apnea    occasional uses CPAP   Uses powered wheelchair    History reviewed. No pertinent family history. Past Surgical History:  Procedure Laterality Date   A/V FISTULAGRAM N/A 08/30/2021   Procedure: A/V FISTULAGRAM;  Surgeon: Maeola Harman, MD;  Location: [facility] INVASIVE CV LAB;  Service: Cardiovascular;  Laterality: N/A;   A/V FISTULAGRAM Right 07/28/2022   Procedure: A/V Fistulagram;  Surgeon: Maeola Harman, MD;  Location: [facility] INVASIVE CV LAB;  Service: Cardiovascular;  Laterality: Right;   A/V FISTULAGRAM Right 09/05/2022   Procedure: A/V Fistulagram;  Surgeon: Maeola Harman, MD;  Location: [facility] INVASIVE CV LAB;  Service: Cardiovascular;  Laterality: Right;   A/V FISTULAGRAM Right 10/31/2022    Procedure: A/V Fistulagram;  Surgeon: Maeola Harman, MD;  Location: [facility] INVASIVE CV LAB;  Service: Cardiovascular;  Laterality: Right;   A/V SHUNT INTERVENTION Right 10/31/2022   Procedure: A/V SHUNT INTERVENTION;  Surgeon: Maeola Harman, MD;  Location: [facility] INVASIVE CV LAB;  Service: Cardiovascular;  Laterality: Right;   A/V SHUNTOGRAM N/A 11/08/2021   Procedure: A/V SHUNTOGRAM;  Surgeon: Maeola Harman, MD;  Location: [facility] INVASIVE CV LAB;  Service: Cardiovascular;  Laterality: N/A;   AMPUTATION Left 04/03/2021   Procedure: LEFT ABOVE-THE-KNEE AMPUTATION;  Surgeon: Terance Hart, MD;  Location: [facility] OR;  Service: Orthopedics;  Laterality: Left;   AV FISTULA PLACEMENT Right 08/12/2021   Procedure: INSERTION OF ARTERIOVENOUS (AV) GORE-TEX GRAFT RIGHT ARM;  Surgeon: Nada Libman, MD;  Location: [facility] OR;  Service: Vascular;  Laterality: Right;   INSERTION OF DIALYSIS CATHETER N/A 11/25/2022   Procedure: INSERTION OF DIALYSIS CATHETER;  Surgeon: Lorin Glass, MD;  Location: [facility] ENDOSCOPY;  Service: Pulmonary;  Laterality: N/A;   INSERTION OF DIALYSIS CATHETER N/A 03/03/2023   Procedure: INSERTION OF TUNNELED DIALYSIS CATHETER;  Surgeon: Maeola Harman, MD;  Location: [facility] OR;  Service: Vascular;  Laterality: N/A;   IR REMOVAL TUN CV CATH W/O FL  03/30/2021   JOINT REPLACEMENT     Bilateral knees   LIGATION ARTERIOVENOUS GORTEX GRAFT Right 03/03/2023  Procedure: LIGATION RIGHT ARM ARTERIOVENOUS GORTEX GRAFT;  Surgeon: Maeola Harman, MD;  Location: [facility] OR;  Service: Vascular;  Laterality: Right;   PERIPHERAL VASCULAR BALLOON ANGIOPLASTY Right 08/30/2021   Procedure: PERIPHERAL VASCULAR BALLOON ANGIOPLASTY;  Surgeon: Maeola Harman, MD;  Location: [facility] INVASIVE CV LAB;  Service: Cardiovascular;  Laterality: Right;   PERIPHERAL VASCULAR BALLOON ANGIOPLASTY Right 07/28/2022   Procedure: PERIPHERAL VASCULAR BALLOON ANGIOPLASTY;  Surgeon: Maeola Harman, MD;  Location: [facility] INVASIVE CV LAB;  Service: Cardiovascular;  Laterality: Right;  AVF   PERIPHERAL VASCULAR INTERVENTION  11/08/2021   Procedure: PERIPHERAL VASCULAR INTERVENTION;  Surgeon: Maeola Harman, MD;  Location: [facility] INVASIVE CV LAB;  Service: Cardiovascular;;  Central rt arm fistula   UPPER EXTREMITY VENOGRAPHY Right 08/10/2021   Procedure: UPPER EXTREMITY VENOGRAPHY;  Surgeon: Nada Libman, MD;  Location: [facility] INVASIVE CV LAB;  Service: Cardiovascular;  Laterality: Right;    Short Social History:  Social History   Tobacco Use   Smoking status: Former    Types: Cigarettes    Quit date: 2012    Years since quitting: 12.3   Smokeless tobacco: Never  Substance Use Topics   Alcohol use: Yes    Comment: occasionally    Allergies  Allergen Reactions   Actos [Pioglitazone] Anaphylaxis and Rash   Dexmedetomidine Nausea And Vomiting    (Precedex) Dose-limiting bradycardia     Ibuprofen Shortness Of Breath and Swelling    Pt tolerates aspirin    Tomato Anaphylaxis    Only allergic to RAW tomatoes   Wellbutrin [Bupropion] Swelling    Current Outpatient Medications  Medication Sig Dispense Refill   amLODipine (NORVASC) 10 MG tablet Take 10 mg by mouth every morning.     ASPIRIN LOW DOSE 81 MG EC tablet Take 81 mg by mouth in the morning.     atorvastatin (LIPITOR) 40 MG tablet Take 40 mg by mouth in the morning.     calcium acetate (PHOSLO) 667 MG capsule Take 2 capsules (1,334 mg total) by mouth with breakfast, with lunch, and with evening meal.     carvedilol (COREG) 12.5 MG tablet Take 12.5 mg by mouth 2 (two) times daily with a meal.     Cholecalciferol (VITAMIN D) 50 MCG (2000 UT) CAPS Take 2,000 Units by mouth daily.     clopidogrel (PLAVIX) 75 MG tablet Take 1 tablet (75 mg total) by mouth daily. 30 tablet 11   cyclobenzaprine (FLEXERIL) 10 MG tablet Take 10 mg by mouth 3 (three) times daily as needed for muscle spasms.     diazepam  (VALIUM) 5 MG tablet Take 5 mg by mouth 2 (two) times daily as needed for muscle spasms.     insulin regular human CONCENTRATED (HUMULIN R U-500 KWIKPEN) 500 UNIT/ML KwikPen Inject 45 Units into the skin daily with supper. (Patient taking differently: Inject 125 Units into the skin 2 (two) times daily with a meal.) 2.7 mL 0   metolazone (ZAROXOLYN) 10 MG tablet Take 10 mg by mouth daily.     nortriptyline (PAMELOR) 10 MG capsule Take 20 mg by mouth at bedtime.     oxyCODONE (ROXICODONE) 5 MG immediate release tablet Take 1 tablet (5 mg total) by mouth every 8 (eight) hours as needed. (Patient taking differently: Take 5 mg by mouth every 8 (eight) hours as needed for moderate pain.) 20 tablet 0   pantoprazole (PROTONIX) 40 MG tablet Take 40 mg by mouth daily before breakfast.     pregabalin (  LYRICA) 75 MG capsule Take 1 capsule (75 mg total) by mouth See admin instructions. Daily.  Give after dialysis on dialysis days. 30 capsule 0   sertraline (ZOLOFT) 100 MG tablet Take 100 mg by mouth in the morning.     tadalafil (CIALIS) 20 MG tablet Take 20 mg by mouth daily as needed for erectile dysfunction.     tamsulosin (FLOMAX) 0.4 MG CAPS capsule Take 1 capsule (0.4 mg total) by mouth daily. 30 capsule 0   torsemide (DEMADEX) 20 MG tablet Take 120 mg by mouth 2 (two) times daily.     traZODone (DESYREL) 150 MG tablet Take 150 mg by mouth at bedtime.     No current facility-administered medications for this visit.    Review of Systems  Constitutional:  Constitutional negative. HENT: HENT negative.  Eyes: Eyes negative.  Respiratory: Respiratory negative.  Cardiovascular: Cardiovascular negative.  GI: Gastrointestinal negative.  Musculoskeletal:       Right arm swelling Skin: Skin negative.  Neurological: Neurological negative. Hematologic: Hematologic/lymphatic negative.  Psychiatric: Psychiatric negative.        Objective:  Objective   Vitals:   03/22/23 0923  BP: (!) 158/73  Pulse:  80  Resp: 20  Temp: 98.2 F (36.8 C)  SpO2: (!) 82%  Weight: 293 lb (132.9 kg)  Height:  (1.651 m)   Body mass index is 48.76 kg/m.  Physical Exam HENT:     Head: Normocephalic.     Nose: Nose normal.  Eyes:     Pupils: Pupils are equal, round, and reactive to light.  Cardiovascular:     Rate and Rhythm: Normal rate.     Pulses: Normal pulses.  Abdominal:     General: Abdomen is flat.     Palpations: Abdomen is soft.  Musculoskeletal:        General: Swelling present.     Comments: Well-healed right arm wound  Neurological:     General: No focal deficit present.     Mental Status: He is alert.  Psychiatric:        Mood and Affect: Mood normal.        Thought Content: Thought content normal.     Data: No studies     Assessment/Plan:     62 year old male with history of failed left arm AV fistula and recently ligated right arm AV graft with significant swelling.  He still has persistent swelling of the right arm this was wrapped with Ace wrap I recommended elevation as tolerated.  Will plan for left arm AV fistula versus more likely graft on a nondialysis day in the near future.  Patient currently dialyzes at [facility] on Tuesdays Thursdays and Saturdays.  I discussed the risk benefits alternatives including the risk of nonfunction and need for abandoning the access should we have persistent swelling at which time he will become catheter dependent he demonstrates good understanding.     Maeola Harman MD [facility]

## 2023-03-22 NOTE — H&P (View-Only) (Signed)
 Patient ID: Jeffrey [name], male   DOB: 03/31/1961, 61 y.o.   MRN: [id number]  Reason for Consult: Follow-up   Referred by Jones, Enrico, MD  Subjective:     HPI:  Jeffrey [name] is a 61 y.o. male has a history of end-stage renal disease currently on dialysis via left IJ tunneled dialysis catheter.  He recently had a right arm AV graft ligated for severe swelling.  He still has some swelling in his right upper extremity.  He has a previous fistula placed in his left arm 4 years ago which never worked and was quickly abandoned and moved to right arm AV graft.  He is right-hand dominant.  He does not take any blood thinners.  Past Medical History:  Diagnosis Date   Altered mental status 05/04/2021   Arthritis    Blood transfusion without reported diagnosis    CHF (congestive heart failure)    COPD (chronic obstructive pulmonary disease)    Depression    Diabetes mellitus without complication    type 2   Dyspnea    ESRD on hemodialysis    Tues Thurs Sat   GERD (gastroesophageal reflux disease)    protonix   Heart murmur    as a child, no problems   Hypertension    Myocardial infarction    PTSD (post-traumatic stress disorder)    Sepsis    Sleep apnea    occasional uses CPAP   Uses powered wheelchair    History reviewed. No pertinent family history. Past Surgical History:  Procedure Laterality Date   A/V FISTULAGRAM N/A 08/30/2021   Procedure: A/V FISTULAGRAM;  Surgeon: Johnn Krasowski Christopher, MD;  [facility]: [facility];  Service: Cardiovascular;  Laterality: N/A;   A/V FISTULAGRAM Right 07/28/2022   Procedure: A/V Fistulagram;  Surgeon: Gunner Iodice Christopher, MD;  Location: [facility];  Service: Cardiovascular;  Laterality: Right;   A/V FISTULAGRAM Right 09/05/2022   Procedure: A/V Fistulagram;  Surgeon: Raghad Lorenz Christopher, MD;  Location: [facility];  Service: Cardiovascular;  Laterality: Right;   A/V FISTULAGRAM Right 10/31/2022    Procedure: A/V Fistulagram;  Surgeon: Nevaeh Korte Christopher, MD;  [facility]: [facility];  Service: Cardiovascular;  Laterality: Right;   A/V SHUNT INTERVENTION Right 10/31/2022   Procedure: A/V SHUNT INTERVENTION;  Surgeon: Braysen Cloward Christopher, MD;  [facility]: [facility];  Service: Cardiovascular;  Laterality: Right;   A/V SHUNTOGRAM N/A 11/08/2021   Procedure: A/V SHUNTOGRAM;  Surgeon: Haevyn Ury Christopher, MD;  [facility];  Service: Cardiovascular;  Laterality: N/A;   AMPUTATION Left 04/03/2021   Procedure: LEFT ABOVE-THE-KNEE AMPUTATION;  Surgeon: Adair, Christopher R, MD;  Location: [facility];  Service: Orthopedics;  Laterality: Left;   AV FISTULA PLACEMENT Right 08/12/2021   Procedure: INSERTION OF ARTERIOVENOUS (AV) GORE-TEX GRAFT RIGHT ARM;  Surgeon: Brabham, Vance W, MD;  Location: [facility];  Service: Vascular;  Laterality: Right;   INSERTION OF DIALYSIS CATHETER N/A 11/25/2022   Procedure: INSERTION OF DIALYSIS CATHETER;  Surgeon: Smith, Daniel C, MD;  [facility]: [facility];  Service: Pulmonary;  Laterality: N/A;   INSERTION OF DIALYSIS CATHETER N/A 03/03/2023   Procedure: INSERTION OF TUNNELED DIALYSIS CATHETER;  Surgeon: Yoav Okane Christopher, MD;  Location: [facility];  Service: Vascular;  Laterality: N/A;   IR REMOVAL TUN CV CATH W/O FL  03/30/2021   JOINT REPLACEMENT     Bilateral knees   LIGATION ARTERIOVENOUS GORTEX GRAFT Right 03/03/2023     Procedure: LIGATION RIGHT ARM ARTERIOVENOUS GORTEX GRAFT;  Surgeon: Mckaylee Dimalanta Christopher, MD;  Location: [facility];  Service: Vascular;  Laterality: Right;   PERIPHERAL VASCULAR BALLOON ANGIOPLASTY Right 08/30/2021   Procedure: PERIPHERAL VASCULAR BALLOON ANGIOPLASTY;  Surgeon: Chistopher Mangino Christopher, MD;  [facility];  Service: Cardiovascular;  Laterality: Right;   PERIPHERAL VASCULAR BALLOON ANGIOPLASTY Right 07/28/2022   Procedure: PERIPHERAL VASCULAR BALLOON ANGIOPLASTY;  Surgeon: Linnette Panella,  Fariha Goto Christopher, MD;  [facility]: [facility];  Service: Cardiovascular;  Laterality: Right;  AVF   PERIPHERAL VASCULAR INTERVENTION  11/08/2021   Procedure: PERIPHERAL VASCULAR INTERVENTION;  Surgeon: Julion Gatt Christopher, MD;  [facility];  Service: Cardiovascular;;  Central rt arm fistula   UPPER EXTREMITY VENOGRAPHY Right 08/10/2021   Procedure: UPPER EXTREMITY VENOGRAPHY;  Surgeon: Brabham, Vance W, MD;  [facility];  Service: Cardiovascular;  Laterality: Right;    Short Social History:  Social History   Tobacco Use   Smoking status: Former    Types: Cigarettes    Quit date: 2012    Years since quitting: 12.3   Smokeless tobacco: Never  Substance Use Topics   Alcohol use: Yes    Comment: occasionally    Allergies  Allergen Reactions   Actos [Pioglitazone] Anaphylaxis and Rash   Dexmedetomidine Nausea And Vomiting    (Precedex) Dose-limiting bradycardia     Ibuprofen Shortness Of Breath and Swelling    Pt tolerates aspirin    Tomato Anaphylaxis    Only allergic to RAW tomatoes   Wellbutrin [Bupropion] Swelling    Current Outpatient Medications  Medication Sig Dispense Refill   amLODipine (NORVASC) 10 MG tablet Take 10 mg by mouth every morning.     ASPIRIN LOW DOSE 81 MG EC tablet Take 81 mg by mouth in the morning.     atorvastatin (LIPITOR) 40 MG tablet Take 40 mg by mouth in the morning.     calcium acetate (PHOSLO) 667 MG capsule Take 2 capsules (1,334 mg total) by mouth with breakfast, with lunch, and with evening meal.     carvedilol (COREG) 12.5 MG tablet Take 12.5 mg by mouth 2 (two) times daily with a meal.     Cholecalciferol (VITAMIN D) 50 MCG (2000 UT) CAPS Take 2,000 Units by mouth daily.     clopidogrel (PLAVIX) 75 MG tablet Take 1 tablet (75 mg total) by mouth daily. 30 tablet 11   cyclobenzaprine (FLEXERIL) 10 MG tablet Take 10 mg by mouth 3 (three) times daily as needed for muscle spasms.     diazepam  (VALIUM) 5 MG tablet Take 5 mg by mouth 2 (two) times daily as needed for muscle spasms.     insulin regular human CONCENTRATED (HUMULIN R U-500 KWIKPEN) 500 UNIT/ML KwikPen Inject 45 Units into the skin daily with supper. (Patient taking differently: Inject 125 Units into the skin 2 (two) times daily with a meal.) 2.7 mL 0   metolazone (ZAROXOLYN) 10 MG tablet Take 10 mg by mouth daily.     nortriptyline (PAMELOR) 10 MG capsule Take 20 mg by mouth at bedtime.     oxyCODONE (ROXICODONE) 5 MG immediate release tablet Take 1 tablet (5 mg total) by mouth every 8 (eight) hours as needed. (Patient taking differently: Take 5 mg by mouth every 8 (eight) hours as needed for moderate pain.) 20 tablet 0   pantoprazole (PROTONIX) 40 MG tablet Take 40 mg by mouth daily before breakfast.     pregabalin (  LYRICA) 75 MG capsule Take 1 capsule (75 mg total) by mouth See admin instructions. Daily.  Give after dialysis on dialysis days. 30 capsule 0   sertraline (ZOLOFT) 100 MG tablet Take 100 mg by mouth in the morning.     tadalafil (CIALIS) 20 MG tablet Take 20 mg by mouth daily as needed for erectile dysfunction.     tamsulosin (FLOMAX) 0.4 MG CAPS capsule Take 1 capsule (0.4 mg total) by mouth daily. 30 capsule 0   torsemide (DEMADEX) 20 MG tablet Take 120 mg by mouth 2 (two) times daily.     traZODone (DESYREL) 150 MG tablet Take 150 mg by mouth at bedtime.     No current facility-administered medications for this visit.    Review of Systems  Constitutional:  Constitutional negative. HENT: HENT negative.  Eyes: Eyes negative.  Respiratory: Respiratory negative.  Cardiovascular: Cardiovascular negative.  GI: Gastrointestinal negative.  Musculoskeletal:       Right arm swelling Skin: Skin negative.  Neurological: Neurological negative. Hematologic: Hematologic/lymphatic negative.  Psychiatric: Psychiatric negative.        Objective:  Objective   Vitals:   03/22/23 0923  BP: (!) 158/73  Pulse:  80  Resp: 20  Temp: 98.2 F (36.8 C)  SpO2: (!) 82%  Weight: 293 lb (132.9 kg)  Height: 5' 5" (1.651 m)   Body mass index is 48.76 kg/m.  Physical Exam HENT:     Head: Normocephalic.     Nose: Nose normal.  Eyes:     Pupils: Pupils are equal, round, and reactive to light.  Cardiovascular:     Rate and Rhythm: Normal rate.     Pulses: Normal pulses.  Abdominal:     General: Abdomen is flat.     Palpations: Abdomen is soft.  Musculoskeletal:        General: Swelling present.     Comments: Well-healed right arm wound  Neurological:     General: No focal deficit present.     Mental Status: He is alert.  Psychiatric:        Mood and Affect: Mood normal.        Thought Content: Thought content normal.     Data: No studies     Assessment/Plan:     61-year-old male with history of failed left arm AV fistula and recently ligated right arm AV graft with significant swelling.  He still has persistent swelling of the right arm this was wrapped with Ace wrap I recommended elevation as tolerated.  Will plan for left arm AV fistula versus more likely graft on a nondialysis day in the near future.  Patient currently dialyzes at [facility] on Tuesdays Thursdays and Saturdays.  I discussed the risk benefits alternatives including the risk of nonfunction and need for abandoning the access should we have persistent swelling at which time he will become catheter dependent he demonstrates good understanding.     Ayako Tapanes Christopher Doni Widmer MD [facility]   

## 2023-03-23 ENCOUNTER — Emergency Department (HOSPITAL_COMMUNITY)
Admission: EM | Admit: 2023-03-23 | Discharge: 2023-03-23 | Disposition: A | Discharge status: Home / Self Care | Payer: 59 | Attending: Emergency Medicine | Admitting: Emergency Medicine

## 2023-03-23 ENCOUNTER — Telehealth: Payer: Self-pay

## 2023-03-23 DIAGNOSIS — I509 Heart failure, unspecified: Secondary | ICD-10-CM | POA: Diagnosis not present

## 2023-03-23 DIAGNOSIS — J449 Chronic obstructive pulmonary disease, unspecified: Secondary | ICD-10-CM | POA: Insufficient documentation

## 2023-03-23 DIAGNOSIS — N186 End stage renal disease: Secondary | ICD-10-CM | POA: Insufficient documentation

## 2023-03-23 DIAGNOSIS — Z794 Long term (current) use of insulin: Secondary | ICD-10-CM | POA: Insufficient documentation

## 2023-03-23 DIAGNOSIS — I132 Hypertensive heart and chronic kidney disease with heart failure and with stage 5 chronic kidney disease, or end stage renal disease: Secondary | ICD-10-CM | POA: Insufficient documentation

## 2023-03-23 DIAGNOSIS — Z992 Dependence on renal dialysis: Secondary | ICD-10-CM | POA: Diagnosis not present

## 2023-03-23 DIAGNOSIS — Z7902 Long term (current) use of antithrombotics/antiplatelets: Secondary | ICD-10-CM | POA: Diagnosis not present

## 2023-03-23 DIAGNOSIS — E1122 Type 2 diabetes mellitus with diabetic chronic kidney disease: Secondary | ICD-10-CM | POA: Diagnosis not present

## 2023-03-23 DIAGNOSIS — Z79899 Other long term (current) drug therapy: Secondary | ICD-10-CM | POA: Insufficient documentation

## 2023-03-23 DIAGNOSIS — Z7982 Long term (current) use of aspirin: Secondary | ICD-10-CM | POA: Insufficient documentation

## 2023-03-23 LAB — CBC
HCT: 26.9 % — ABNORMAL LOW (ref 39.0–52.0)
Hemoglobin: 8.6 g/dL — ABNORMAL LOW (ref 13.0–17.0)
MCH: 28.9 pg (ref 26.0–34.0)
MCHC: 32 g/dL (ref 30.0–36.0)
MCV: 90.3 fL (ref 80.0–100.0)
Platelets: 107 10*3/uL — ABNORMAL LOW (ref 150–400)
RBC: 2.98 MIL/uL — ABNORMAL LOW (ref 4.22–5.81)
RDW: 13.8 % (ref 11.5–15.5)
WBC: 6.3 10*3/uL (ref 4.0–10.5)
nRBC: 0 % (ref 0.0–0.2)

## 2023-03-23 LAB — RENAL FUNCTION PANEL
Albumin: 3.6 g/dL (ref 3.5–5.0)
Anion gap: 13 (ref 5–15)
BUN: 87 mg/dL — ABNORMAL HIGH (ref 8–23)
CO2: 33 mmol/L — ABNORMAL HIGH (ref 22–32)
Calcium: 8.2 mg/dL — ABNORMAL LOW (ref 8.9–10.3)
Chloride: 93 mmol/L — ABNORMAL LOW (ref 98–111)
Creatinine, Ser: 5.65 mg/dL — ABNORMAL HIGH (ref 0.61–1.24)
GFR, Estimated: 11 mL/min — ABNORMAL LOW (ref >60)
Glucose, Bld: 251 mg/dL — ABNORMAL HIGH (ref 70–99)
Phosphorus: 4.1 mg/dL (ref 2.5–4.6)
Potassium: 3.5 mmol/L (ref 3.5–5.1)
Sodium: 139 mmol/L (ref 135–145)

## 2023-03-23 LAB — HEPATITIS B SURFACE ANTIGEN: Hepatitis B Surface Ag: NONREACTIVE

## 2023-03-23 MED ORDER — HEPARIN SODIUM (PORCINE) 1000 UNIT/ML DIALYSIS
1000.0000 [IU] | INTRAMUSCULAR | Status: DC | PRN
  Administered 2023-03-23: 1000 [IU]
  Filled 2023-03-23 (×3): qty 1

## 2023-03-23 MED ORDER — CHLORHEXIDINE GLUCONATE CLOTH 2 % EX PADS: 6.0000 | MEDICATED_PAD | Freq: Every day | CUTANEOUS | Status: DC

## 2023-03-23 MED ORDER — ALTEPLASE 2 MG IJ SOLR
2.0000 mg | Freq: Once | INTRAMUSCULAR | Status: AC | PRN
  Administered 2023-03-23: 2 mg
  Filled 2023-03-23: qty 2

## 2023-03-23 MED ORDER — HEPARIN SODIUM (PORCINE) 1000 UNIT/ML DIALYSIS
3000.0000 [IU] | INTRAMUSCULAR | Status: DC | PRN
  Administered 2023-03-23: 3000 [IU] via INTRAVENOUS_CENTRAL
  Filled 2023-03-23: qty 3

## 2023-03-23 MED ORDER — ANTICOAGULANT SODIUM CITRATE 4% (200MG/5ML) IV SOLN: 5.0000 mL | Status: DC | PRN

## 2023-03-23 MED ORDER — HEPARIN SODIUM (PORCINE) 1000 UNIT/ML DIALYSIS: 2000.0000 [IU] | INTRAMUSCULAR | Status: DC | PRN

## 2023-03-23 NOTE — ED Notes (Signed)
Pt. back from dialysis

## 2023-03-23 NOTE — Telephone Encounter (Signed)
Attempt to contact patient to schedule left arm AVF/AVG surgery, but he is currently at [facility] ED.

## 2023-03-23 NOTE — ED Provider Notes (Addendum)
[place] EMERGENCY DEPARTMENT AT [facility] Provider Note   CSN: [id number] Arrival date & time: 03/23/23  4098     History  No chief complaint on file.   Jeffrey [name] is a 62 y.o. male with PMHx of ESRD, DM, HTN, CHF, PTSD and COPD on 3L [place] baseline who presents to ED for dialysis. Patient dismissed from outpatient dialysis centers and comes to ED for treatments. Last dialysis 03/21/23. No complaints today. Denies fever, chills, chest pain, dyspnea, nausea, vomiting, diarrhea, abdominal pain.  HPI     Home Medications Prior to Admission medications   Medication Sig Start Date End Date Taking? Authorizing Provider  amLODipine (NORVASC) 10 MG tablet Take 10 mg by mouth every morning. 04/17/20   [provider]  ASPIRIN LOW DOSE 81 MG EC tablet Take 81 mg by mouth in the morning. 02/22/21   [provider]  atorvastatin (LIPITOR) 40 MG tablet Take 40 mg by mouth in the morning. 02/09/21   [provider]  calcium acetate (PHOSLO) 667 MG capsule Take 2 capsules (1,334 mg total) by mouth with breakfast, with lunch, and with evening meal. 04/10/21   Calvert Cantor, MD  carvedilol (COREG) 12.5 MG tablet Take 12.5 mg by mouth 2 (two) times daily with a meal. 04/17/20   [provider]  Cholecalciferol (VITAMIN D) 50 MCG (2000 UT) CAPS Take 2,000 Units by mouth daily.    [provider]  clopidogrel (PLAVIX) 75 MG tablet Take 1 tablet (75 mg total) by mouth daily. 11/02/22   Rhyne, Ames Coupe, PA-C  cyclobenzaprine (FLEXERIL) 10 MG tablet Take 10 mg by mouth 3 (three) times daily as needed for muscle spasms. 12/29/21   [provider]  diazepam (VALIUM) 5 MG tablet Take 5 mg by mouth 2 (two) times daily as needed for muscle spasms. 12/09/21   [provider]  insulin regular human CONCENTRATED (HUMULIN R U-500 KWIKPEN) 500 UNIT/ML KwikPen Inject 45 Units into the skin daily with supper. Patient taking differently: Inject 125  Units into the skin 2 (two) times daily with a meal. 09/04/21 08/30/24  [name], MD  metolazone (ZAROXOLYN) 10 MG tablet Take 10 mg by mouth daily. 07/13/21   [provider]  nortriptyline (PAMELOR) 10 MG capsule Take 20 mg by mouth at bedtime. 10/23/19 08/30/24  [provider]  oxyCODONE (ROXICODONE) 5 MG immediate release tablet Take 1 tablet (5 mg total) by mouth every 8 (eight) hours as needed. Patient taking differently: Take 5 mg by mouth every 8 (eight) hours as needed for moderate pain. 12/31/22 12/31/23  [name], [name], DO  pantoprazole (PROTONIX) 40 MG tablet Take 40 mg by mouth daily before breakfast. 02/09/21   [provider]  pregabalin (LYRICA) 75 MG capsule Take 1 capsule (75 mg total) by mouth See admin instructions. Daily.  Give after dialysis on dialysis days. 01/21/23   [name], MD  sertraline (ZOLOFT) 100 MG tablet Take 100 mg by mouth in the morning. 02/22/21   [provider]  tadalafil (CIALIS) 20 MG tablet Take 20 mg by mouth daily as needed for erectile dysfunction.    [provider]  tamsulosin (FLOMAX) 0.4 MG CAPS capsule Take 1 capsule (0.4 mg total) by mouth daily. 11/10/21   [name], MD  torsemide (DEMADEX) 20 MG tablet Take 120 mg by mouth 2 (two) times daily.    [provider]  traZODone (DESYREL) 150 MG tablet Take 150 mg by mouth at  bedtime. 12/09/21   [provider]      Allergies    Actos [pioglitazone], Dexmedetomidine, Ibuprofen, Tomato, and Wellbutrin [bupropion]    Review of Systems   Review of Systems  Cardiovascular:  Negative for chest pain and palpitations.  All other systems reviewed and are negative.   Physical Exam Updated Vital Signs BP (!) 159/55 (BP Location: Right Arm)   Pulse 83   Temp 97.6 F (36.4 C)   Resp 16   SpO2 100%  Physical Exam Vitals and nursing note reviewed.  Constitutional:      General: He is not in acute distress.    Appearance: He is  not ill-appearing or toxic-appearing.  HENT:     Head: Normocephalic and atraumatic.     Mouth/Throat:     Mouth: Mucous membranes are moist.     Pharynx: No oropharyngeal exudate or posterior oropharyngeal erythema.  Eyes:     General: No scleral icterus.       Right eye: No discharge.        Left eye: No discharge.     Conjunctiva/sclera: Conjunctivae normal.  Cardiovascular:     Rate and Rhythm: Normal rate.     Pulses: Normal pulses.     Heart sounds: Normal heart sounds. No murmur heard. Pulmonary:     Effort: Pulmonary effort is normal. No respiratory distress.     Breath sounds: Normal breath sounds. No wheezing, rhonchi or rales.  Abdominal:     General: Bowel sounds are normal.     Tenderness: There is no abdominal tenderness.  Musculoskeletal:     Right lower leg: No edema.     Comments: Left aka  Skin:    General: Skin is warm and dry.     Findings: No rash.  Neurological:     General: No focal deficit present.     Mental Status: He is alert. Mental status is at baseline.  Psychiatric:        Mood and Affect: Mood normal.        Behavior: Behavior normal.     ED Results / Procedures / Treatments   Labs (all labs ordered are listed, but only abnormal results are displayed) Labs Reviewed - No data to display  EKG None  Radiology No results found.  Procedures Procedures    Medications Ordered in ED Medications - No data to display  ED Course/ Medical Decision Making/ A&P                             Medical Decision Making Risk Decision regarding hospitalization.   This patient presents to the ED for dialysis. Patient without any symptoms. Stating that his chest port was clotted last appointment, but they're going to try using it again and/or find him a new port site. Nephrology consulted to take patient for dialysis. Expecting patient to return to ED after dialysis. Will reassess and discharge if stable.   Co morbidities that complicate the  patient evaluation  DM   Additional history obtained:  none   Social Determinants of Health:  none         Final Clinical Impression(s) / ED Diagnoses Final diagnoses:  ESRD on dialysis    Rx / DC Orders ED Discharge Orders     None         Margarita Rana 03/23/23 0758    Eber Hong, MD 03/23/23 1610    Valrie Hart  F, PA-C 03/23/23 1305    Eber Hong, MD 03/24/23 [phone]

## 2023-03-23 NOTE — Discharge Instructions (Signed)
Please go to dialysis as scheduled  See your doctor for follow-up  Return to ER if you have worse chest pain or shortness of breath

## 2023-03-23 NOTE — Telephone Encounter (Signed)
Pt called stating that he was at HD and his [employer] was not working. He needed to go to IR.  Called [facility] ED HD and asked for pt to be referred to IR. Confirmed understanding.

## 2023-03-23 NOTE — Progress Notes (Signed)
POST HD TX NOTE  03/23/23 1624  Vitals  Temp 97.9 F (36.6 C)  Temp Source Oral  BP (!) 173/51  MAP (mmHg) 83  BP Location Left Arm (forearm)  BP Method Automatic  Patient Position (if appropriate) Lying  Pulse Rate 77  Pulse Rate Source Monitor  ECG Heart Rate 79  Resp 16  Oxygen Therapy  SpO2 99 %  O2 Device Nasal Cannula  O2 Flow Rate (L/min) 3 L/min  Pulse Oximetry Type Continuous  During Treatment Monitoring  Intra-Hemodialysis Comments (S)   (post HD tx VS check)  Post Treatment  Dialyzer Clearance Lightly streaked  Duration of HD Treatment -hour(s) 1.5 hour(s) (1hr 36 mim)  Hemodialysis Intake (mL) 0 mL  Liters Processed 33.6  Fluid Removed (mL) 1100 mL  Tolerated HD Treatment Yes  Post-Hemodialysis Comments (S)  tx ended 1 hr 21 min early d/t pt needing to catch his ride service. tx could have gotten started earlier but once pt arrived to unit and he saw he art port was very sluggish still on the pull assess even after having TPA instilled to bilat ports on tues when hsi art port wouldn't pull at all. he stated we should just let IR do their job and place him a new cath. i stated we can still try and run reversed as the ven port pull/push great. he stated he had already talked to or seen Dr. Randie Heinz about placing a new AVG and that that dr stated that if the cath didn't work today that he would need IR to place a new cath. TPA was placed to bilat ports and report called to ED CN to send pt back. PA was made aware of this, he came to the unit and spoke w/ pt and pt then agreed to run. pt didn't get on until 1430 after all that and he can't run past 1600 (per him) so he can make sure he is back down in the ED in time to cath his ride service. so pt ran for 1 hr 36 min, UF goal not met, blood rinsed back, VSS. Medication Admin: TPA 3.10ml, Heparin 3000 units bolus, Heparin Dwells 3800 units. PA put in order for IR consult to come see him while he was up here on tx. They did not show  up  Hemodialysis Catheter Left Internal jugular Double lumen Permanent (Tunneled)  Placement Date/Time: 03/03/23 0856   Serial / Lot #: [id number]  Expiration Date: 08/30/27  Time Out: Correct patient;Correct site;Correct procedure  Maximum sterile barrier precautions: Hand hygiene;Cap;Mask;Sterile gown;Sterile gloves;Large sterile ...  Site Condition No complications  Blue Lumen Status Heparin locked;Dead end cap in place  Red Lumen Status Heparin locked;Dead end cap in place  Purple Lumen Status N/A  Catheter fill solution Heparin 1000 units/ml  Catheter fill volume (Arterial) 1.9 cc  Catheter fill volume (Venous) 1.9  Dressing Type Transparent  Dressing Status Antimicrobial disc in place;Clean, Dry, Intact  Drainage Description None  Dressing Change Due 03/28/23  Post treatment catheter status Capped and Clamped

## 2023-03-23 NOTE — ED Provider Notes (Signed)
  Physical Exam  BP (!) 173/51 (BP Location: Left Arm) Comment (BP Location): forearm  Pulse 77   Temp 97.9 F (36.6 C) (Oral)   Resp 16   SpO2 99%   Physical Exam  Procedures  Procedures  ED Course / MDM    Medical Decision Making Patient came back from dialysis around 5 PM.  Patient states that he is feeling better.  He has a power wheelchair.  He is stable for discharge  Problems Addressed: ESRD on dialysis: chronic illness or injury  Risk Decision regarding hospitalization.          Charlynne Pander, MD 03/23/23 [phone]

## 2023-03-23 NOTE — Progress Notes (Signed)
Tx initiated via HD cath w/ cath issues. Pt cath dwells were filled w/ TPA on Tuesday when his cath wouldn't work as the art port wouldn't pull at all then. He was sent home w/ TPA to both dwells. Today, the art port would pull, but it was really sluggish. The ven port pull/push great so I was going to reverse him but he wouldn't let me. He stated that if the art port was sluggish, "lets let IR do their job and place me a new cath". He refused to let me try and run him reversed despite being re educated on the facts that he hasn't had a tx since Saturday. PA was made aware and pt HD cath was again filled w/ TPA and report was called to send him back to the ED. PA arrived on the unit before transport could get pt. He talked to pt and pt agreed to let us try and run him reversed. He stated he would not run past 1530.

## 2023-03-23 NOTE — ED Notes (Signed)
Pt seen scooting out the ED on his electric chair, unwilling to wait for discharge paperwork. States to this Clinical research associate " I'm ready to get up out of here, my ride is here. I'll see yall this Saturday."

## 2023-03-24 LAB — HEPATITIS B SURFACE ANTIBODY, QUANTITATIVE: Hep B S AB Quant (Post): 33.2 m[IU]/mL (ref >9.9)

## 2023-03-25 ENCOUNTER — Encounter (HOSPITAL_COMMUNITY): Payer: Self-pay

## 2023-03-25 ENCOUNTER — Emergency Department (HOSPITAL_COMMUNITY)
Admission: EM | Admit: 2023-03-25 | Discharge: 2023-03-25 | Disposition: A | Discharge status: Home / Self Care | Payer: 59 | Attending: Emergency Medicine | Admitting: Emergency Medicine

## 2023-03-25 ENCOUNTER — Other Ambulatory Visit: Payer: Self-pay

## 2023-03-25 DIAGNOSIS — N186 End stage renal disease: Secondary | ICD-10-CM | POA: Diagnosis not present

## 2023-03-25 DIAGNOSIS — Z7902 Long term (current) use of antithrombotics/antiplatelets: Secondary | ICD-10-CM | POA: Diagnosis not present

## 2023-03-25 DIAGNOSIS — E1122 Type 2 diabetes mellitus with diabetic chronic kidney disease: Secondary | ICD-10-CM | POA: Diagnosis present

## 2023-03-25 DIAGNOSIS — I509 Heart failure, unspecified: Secondary | ICD-10-CM | POA: Insufficient documentation

## 2023-03-25 DIAGNOSIS — Z7951 Long term (current) use of inhaled steroids: Secondary | ICD-10-CM | POA: Insufficient documentation

## 2023-03-25 DIAGNOSIS — Z794 Long term (current) use of insulin: Secondary | ICD-10-CM | POA: Insufficient documentation

## 2023-03-25 DIAGNOSIS — Z992 Dependence on renal dialysis: Secondary | ICD-10-CM | POA: Diagnosis not present

## 2023-03-25 DIAGNOSIS — J449 Chronic obstructive pulmonary disease, unspecified: Secondary | ICD-10-CM | POA: Insufficient documentation

## 2023-03-25 DIAGNOSIS — Z7982 Long term (current) use of aspirin: Secondary | ICD-10-CM | POA: Insufficient documentation

## 2023-03-25 LAB — RENAL FUNCTION PANEL
Albumin: 3.6 g/dL (ref 3.5–5.0)
Anion gap: 15 (ref 5–15)
BUN: 77 mg/dL — ABNORMAL HIGH (ref 8–23)
CO2: 30 mmol/L (ref 22–32)
Calcium: 8.2 mg/dL — ABNORMAL LOW (ref 8.9–10.3)
Chloride: 92 mmol/L — ABNORMAL LOW (ref 98–111)
Creatinine, Ser: 5.36 mg/dL — ABNORMAL HIGH (ref 0.61–1.24)
GFR, Estimated: 11 mL/min — ABNORMAL LOW (ref >60)
Glucose, Bld: 193 mg/dL — ABNORMAL HIGH (ref 70–99)
Phosphorus: 4.5 mg/dL (ref 2.5–4.6)
Potassium: 3.4 mmol/L — ABNORMAL LOW (ref 3.5–5.1)
Sodium: 137 mmol/L (ref 135–145)

## 2023-03-25 LAB — CBC
HCT: 26.7 % — ABNORMAL LOW (ref 39.0–52.0)
Hemoglobin: 8.6 g/dL — ABNORMAL LOW (ref 13.0–17.0)
MCH: 29.3 pg (ref 26.0–34.0)
MCHC: 32.2 g/dL (ref 30.0–36.0)
MCV: 90.8 fL (ref 80.0–100.0)
Platelets: 112 10*3/uL — ABNORMAL LOW (ref 150–400)
RBC: 2.94 MIL/uL — ABNORMAL LOW (ref 4.22–5.81)
RDW: 14 % (ref 11.5–15.5)
WBC: 5.9 10*3/uL (ref 4.0–10.5)
nRBC: 0 % (ref 0.0–0.2)

## 2023-03-25 MED ORDER — CHLORHEXIDINE GLUCONATE CLOTH 2 % EX PADS: 6.0000 | MEDICATED_PAD | Freq: Every day | CUTANEOUS | Status: DC

## 2023-03-25 MED ORDER — ALTEPLASE 2 MG IJ SOLR
INTRAMUSCULAR | Status: AC
  Administered 2023-03-25: 2 mg
  Filled 2023-03-25: qty 4

## 2023-03-25 MED ORDER — HEPARIN SODIUM (PORCINE) 1000 UNIT/ML IJ SOLN
INTRAMUSCULAR | Status: AC
  Administered 2023-03-25: 1000 [IU]
  Filled 2023-03-25: qty 4

## 2023-03-25 MED ORDER — HEPARIN SODIUM (PORCINE) 1000 UNIT/ML IJ SOLN
INTRAMUSCULAR | Status: AC
  Administered 2023-03-25: 3000 [IU]
  Filled 2023-03-25: qty 3

## 2023-03-25 NOTE — ED Triage Notes (Signed)
Pt came in via POV d/t needing dialysis. A/Ox4, denies pain or SOB.

## 2023-03-25 NOTE — Progress Notes (Signed)
   03/25/23 1443  Vitals  Temp 98.6 F (37 C)  Pulse Rate 82  Resp 16  BP (!) 144/79  SpO2 99 %  O2 Device Nasal Cannula  Post Treatment  Dialyzer Clearance Clear  Duration of HD Treatment -hour(s) 3.15 hour(s)  Hemodialysis Intake (mL) 0 mL  Liters Processed 67.4  Fluid Removed (mL) 2500 mL  Tolerated HD Treatment Yes  Post-Hemodialysis Comments Pt tolerated procedure well without difficulties   Received patient in bed to unit.  Alert and oriented.  Informed consent signed and in chart.   TX duration:3:15  Patient tolerated well.  Transported back to the room  Alert, without acute distress.  Hand-off given to patient's nurse.   Access used: Yes   Access issues: HD catheter did not do well, applied TPA to both HD catheter for 20 mins, before TPA, flushed both lines and reversed lines and turn BFR  down to 350 instead of 400  Total UF removed: 2500 Medication(s) given: Cathflo, Heparin for HD catheter Ports Post HD VS: See Above Grid Post HD weight: No weight bed   [facility]

## 2023-03-25 NOTE — ED Provider Notes (Signed)
Patient returned from dialysis.  No symptoms.  Discharge.   Virgina Norfolk, DO 03/25/23 1550

## 2023-03-25 NOTE — ED Provider Notes (Signed)
[place] EMERGENCY DEPARTMENT AT [facility] Provider Note   CSN: [id number] Arrival date & time: 03/25/23  4098     History  Chief Complaint  Patient presents with   Needs Dialysis    Jeffrey [name] is a 62 y.o. male.  HPI Patient presents for dialysis.  Medical history includes COPD, T2DM, anemia, ESRD, CHF, arthritis, depression.  He has been dismissed from his dialysis center.  He has been coming to the ED for dialysis sessions on T, TH, SA.  He was last dialyzed from the ED 2 days ago.  That session was cut short due to time constraints.  Patient states that he has a ride only up until 5 PM.  During his session 2 days ago, he underwent 1.5 hours of hemodialysis and states that they removed 1 L of fluid.  He is on 3 L of oxygen at all times.  He denies any new pain, shortness of breath, or increased oxygen need.    Home Medications Prior to Admission medications   Medication Sig Start Date End Date Taking? Authorizing Provider  amLODipine (NORVASC) 10 MG tablet Take 10 mg by mouth every morning. 04/17/20   [provider]  ASPIRIN LOW DOSE 81 MG EC tablet Take 81 mg by mouth in the morning. 02/22/21   [provider]  atorvastatin (LIPITOR) 40 MG tablet Take 40 mg by mouth in the morning. 02/09/21   [provider]  calcium acetate (PHOSLO) 667 MG capsule Take 2 capsules (1,334 mg total) by mouth with breakfast, with lunch, and with evening meal. 04/10/21   Calvert Cantor, MD  carvedilol (COREG) 12.5 MG tablet Take 12.5 mg by mouth 2 (two) times daily with a meal. 04/17/20   [provider]  Cholecalciferol (VITAMIN D) 50 MCG (2000 UT) CAPS Take 2,000 Units by mouth daily.    [provider]  clopidogrel (PLAVIX) 75 MG tablet Take 1 tablet (75 mg total) by mouth daily. 11/02/22   Rhyne, Ames Coupe, PA-C  cyclobenzaprine (FLEXERIL) 10 MG tablet Take 10 mg by mouth 3 (three) times daily as needed for muscle spasms. 12/29/21   [provider]  diazepam (VALIUM) 5 MG tablet Take 5 mg by mouth 2 (two) times daily as needed for muscle spasms. 12/09/21   [provider]  insulin regular human CONCENTRATED (HUMULIN R U-500 KWIKPEN) 500 UNIT/ML KwikPen Inject 45 Units into the skin daily with supper. Patient taking differently: Inject 125 Units into the skin 2 (two) times daily with a meal. 09/04/21 08/30/24  [name], MD  metolazone (ZAROXOLYN) 10 MG tablet Take 10 mg by mouth daily. 07/13/21   [provider]  nortriptyline (PAMELOR) 10 MG capsule Take 20 mg by mouth at bedtime. 10/23/19 08/30/24  [provider]  oxyCODONE (ROXICODONE) 5 MG immediate release tablet Take 1 tablet (5 mg total) by mouth every 8 (eight) hours as needed. Patient taking differently: Take 5 mg by mouth every 8 (eight) hours as needed for moderate pain. 12/31/22 12/31/23  [name], [name], DO  pantoprazole (PROTONIX) 40 MG tablet Take 40 mg by mouth daily before breakfast. 02/09/21   [provider]  pregabalin (LYRICA) 75 MG capsule Take 1 capsule (75 mg total) by mouth See admin instructions. Daily.  Give after dialysis on dialysis days. 01/21/23   [name], MD  sertraline (ZOLOFT) 100 MG tablet Take 100 mg by mouth in the morning. 02/22/21   [provider]  tadalafil (CIALIS)  20 MG tablet Take 20 mg by mouth daily as needed for erectile dysfunction.    [provider]  tamsulosin (FLOMAX) 0.4 MG CAPS capsule Take 1 capsule (0.4 mg total) by mouth daily. 11/10/21   [name], MD  torsemide (DEMADEX) 20 MG tablet Take 120 mg by mouth 2 (two) times daily.    [provider]  traZODone (DESYREL) 150 MG tablet Take 150 mg by mouth at bedtime. 12/09/21   [provider]      Allergies    Actos [pioglitazone], Dexmedetomidine, Ibuprofen, Tomato, and Wellbutrin [bupropion]    Review of Systems   Review of Systems  Respiratory:  Negative for shortness of breath.    Cardiovascular:  Negative for chest pain.  All other systems reviewed and are negative.   Physical Exam Updated Vital Signs BP (!) 151/67 (BP Location: Right Wrist)   Pulse 82   Temp 97.8 F (36.6 C) (Oral)   Resp 16   SpO2 94%  Physical Exam Vitals and nursing note reviewed.  Constitutional:      General: He is not in acute distress.    Appearance: He is well-developed. He is not toxic-appearing or diaphoretic.  HENT:     Head: Normocephalic and atraumatic.     Right Ear: External ear normal.     Left Ear: External ear normal.     Nose: Nose normal.     Mouth/Throat:     Mouth: Mucous membranes are moist.  Eyes:     Extraocular Movements: Extraocular movements intact.     Conjunctiva/sclera: Conjunctivae normal.  Cardiovascular:     Rate and Rhythm: Normal rate and regular rhythm.  Pulmonary:     Effort: Pulmonary effort is normal. No respiratory distress.  Abdominal:     Palpations: Abdomen is soft.  Musculoskeletal:        General: Normal range of motion.     Cervical back: Normal range of motion and neck supple.  Skin:    General: Skin is warm and dry.     Coloration: Skin is not jaundiced or pale.  Neurological:     General: No focal deficit present.     Mental Status: He is alert and oriented to person, place, and time.  Psychiatric:        Mood and Affect: Mood normal.        Behavior: Behavior normal.        Thought Content: Thought content normal.        Judgment: Judgment normal.     ED Results / Procedures / Treatments   Labs (all labs ordered are listed, but only abnormal results are displayed) Labs Reviewed  RENAL FUNCTION PANEL  CBC    EKG None  Radiology No results found.  Procedures Procedures    Medications Ordered in ED Medications  Chlorhexidine Gluconate Cloth 2 % PADS 6 each (0 each Topical Hold 03/25/23 0810)    ED Course/ Medical Decision Making/ A&P                             Medical Decision Making Amount and/or  Complexity of Data Reviewed Labs: ordered.   Patient presenting for at dialysis.  He has been undergoing dialysis from the ED on T, TH, and SA.  He received a shortened session of dialysis 2 days ago.  He has no new physical symptoms or concerns other than dialysis need.  I spoke with nephrologist on-call, Dr.  Malen Gauze, who will arrange for dialysis today.  Basic labs were ordered.  Patient will return to the ED after dialysis for reevaluation and likely discharge.        Final Clinical Impression(s) / ED Diagnoses Final diagnoses:  ESRD needing dialysis [facility])    Rx / DC Orders ED Discharge Orders     None         Gloris Manchester, MD 03/25/23 [phone]

## 2023-03-25 NOTE — Progress Notes (Signed)
Pt. HD catheter will not run, pt on machine for 15 mins after flushing lines and reversing line. Had to do a new set-up and instilled Cathflo to both ports for 20 mins and Heparin 3000 units per MD Malen Gauze.

## 2023-03-27 ENCOUNTER — Other Ambulatory Visit: Payer: Self-pay

## 2023-03-27 ENCOUNTER — Telehealth: Payer: Self-pay

## 2023-03-27 DIAGNOSIS — N186 End stage renal disease: Secondary | ICD-10-CM

## 2023-03-27 NOTE — Telephone Encounter (Signed)
Spoke to pt to schedule his surgery. Pt was given pre op instructions and is aware he will need to adjust his HD day to either Monday or Wed, as his surgery is scheduled for Tuesday. He verbalized understanding and has no questions/concerns at this time.

## 2023-03-28 ENCOUNTER — Encounter (HOSPITAL_COMMUNITY): Payer: Self-pay | Admitting: Emergency Medicine

## 2023-03-28 ENCOUNTER — Other Ambulatory Visit: Payer: Self-pay

## 2023-03-28 ENCOUNTER — Emergency Department (HOSPITAL_COMMUNITY)
Admission: EM | Admit: 2023-03-28 | Discharge: 2023-03-28 | Discharge status: Against Medical Advice | Payer: 59 | Attending: Emergency Medicine | Admitting: Emergency Medicine

## 2023-03-28 DIAGNOSIS — Z794 Long term (current) use of insulin: Secondary | ICD-10-CM | POA: Diagnosis not present

## 2023-03-28 DIAGNOSIS — Z992 Dependence on renal dialysis: Secondary | ICD-10-CM | POA: Diagnosis not present

## 2023-03-28 DIAGNOSIS — Z5329 Procedure and treatment not carried out because of patient's decision for other reasons: Secondary | ICD-10-CM | POA: Insufficient documentation

## 2023-03-28 DIAGNOSIS — J449 Chronic obstructive pulmonary disease, unspecified: Secondary | ICD-10-CM | POA: Insufficient documentation

## 2023-03-28 DIAGNOSIS — D696 Thrombocytopenia, unspecified: Secondary | ICD-10-CM | POA: Diagnosis not present

## 2023-03-28 DIAGNOSIS — E1122 Type 2 diabetes mellitus with diabetic chronic kidney disease: Secondary | ICD-10-CM | POA: Insufficient documentation

## 2023-03-28 DIAGNOSIS — D631 Anemia in chronic kidney disease: Secondary | ICD-10-CM | POA: Insufficient documentation

## 2023-03-28 DIAGNOSIS — Z7982 Long term (current) use of aspirin: Secondary | ICD-10-CM | POA: Diagnosis not present

## 2023-03-28 DIAGNOSIS — Z7902 Long term (current) use of antithrombotics/antiplatelets: Secondary | ICD-10-CM | POA: Insufficient documentation

## 2023-03-28 DIAGNOSIS — I509 Heart failure, unspecified: Secondary | ICD-10-CM | POA: Insufficient documentation

## 2023-03-28 DIAGNOSIS — E1165 Type 2 diabetes mellitus with hyperglycemia: Secondary | ICD-10-CM | POA: Diagnosis not present

## 2023-03-28 DIAGNOSIS — N186 End stage renal disease: Secondary | ICD-10-CM | POA: Insufficient documentation

## 2023-03-28 LAB — RENAL FUNCTION PANEL
Albumin: 3.7 g/dL (ref 3.5–5.0)
Anion gap: 18 — ABNORMAL HIGH (ref 5–15)
BUN: 79 mg/dL — ABNORMAL HIGH (ref 8–23)
CO2: 24 mmol/L (ref 22–32)
Calcium: 8.5 mg/dL — ABNORMAL LOW (ref 8.9–10.3)
Chloride: 93 mmol/L — ABNORMAL LOW (ref 98–111)
Creatinine, Ser: 5.78 mg/dL — ABNORMAL HIGH (ref 0.61–1.24)
GFR, Estimated: 10 mL/min — ABNORMAL LOW (ref >60)
Glucose, Bld: 228 mg/dL — ABNORMAL HIGH (ref 70–99)
Phosphorus: 4.3 mg/dL (ref 2.5–4.6)
Potassium: 4 mmol/L (ref 3.5–5.1)
Sodium: 135 mmol/L (ref 135–145)

## 2023-03-28 LAB — CBC
HCT: 24.6 % — ABNORMAL LOW (ref 39.0–52.0)
Hemoglobin: 8.1 g/dL — ABNORMAL LOW (ref 13.0–17.0)
MCH: 29.3 pg (ref 26.0–34.0)
MCHC: 32.9 g/dL (ref 30.0–36.0)
MCV: 89.1 fL (ref 80.0–100.0)
Platelets: 94 10*3/uL — ABNORMAL LOW (ref 150–400)
RBC: 2.76 MIL/uL — ABNORMAL LOW (ref 4.22–5.81)
RDW: 14.1 % (ref 11.5–15.5)
WBC: 5.6 10*3/uL (ref 4.0–10.5)
nRBC: 0 % (ref 0.0–0.2)

## 2023-03-28 MED ORDER — CHLORHEXIDINE GLUCONATE CLOTH 2 % EX PADS: 6.0000 | MEDICATED_PAD | Freq: Every day | CUTANEOUS | Status: DC

## 2023-03-28 MED ORDER — HEPARIN SODIUM (PORCINE) 1000 UNIT/ML IJ SOLN
INTRAMUSCULAR | Status: AC
  Filled 2023-03-28: qty 2

## 2023-03-28 MED ORDER — HEPARIN SODIUM (PORCINE) 1000 UNIT/ML DIALYSIS
2000.0000 [IU] | Freq: Once | INTRAMUSCULAR | Status: AC
  Administered 2023-03-28: 2000 [IU] via INTRAVENOUS_CENTRAL

## 2023-03-28 MED ORDER — ALTEPLASE 2 MG IJ SOLR: 2.0000 mg | Freq: Once | INTRAMUSCULAR | Status: DC | PRN

## 2023-03-28 MED ORDER — ANTICOAGULANT SODIUM CITRATE 4% (200MG/5ML) IV SOLN
5.0000 mL | Status: DC | PRN
  Filled 2023-03-28: qty 5

## 2023-03-28 MED ORDER — HEPARIN SODIUM (PORCINE) 1000 UNIT/ML DIALYSIS
2000.0000 [IU] | Freq: Once | INTRAMUSCULAR | Status: AC
  Administered 2023-03-28: 2000 [IU] via INTRAVENOUS_CENTRAL
  Filled 2023-03-28 (×2): qty 2

## 2023-03-28 MED ORDER — HEPARIN SODIUM (PORCINE) 1000 UNIT/ML DIALYSIS
1000.0000 [IU] | INTRAMUSCULAR | Status: DC | PRN
  Administered 2023-03-28: 1000 [IU]
  Filled 2023-03-28 (×2): qty 1

## 2023-03-28 NOTE — ED Provider Notes (Signed)
[place] EMERGENCY DEPARTMENT AT [facility] Provider Note   CSN: [id number] Arrival date & time: 03/28/23  4098     History Chief Complaint  Patient presents with   Needs DIALYSIS    Jeffrey [name] is a 62 y.o. male [place] 3L [place] consistently, T2DM, anemia, ESRD, CHF, arthritis, depression presents to the ER today for dialysis. The patient has been coming to the ER for dialysis on T, Th, and Sat and was last dialyzed on 03/25/23. He was dismissed from his last dialysis center. He has not other or new complaints.   HPI     Home Medications Prior to Admission medications   Medication Sig Start Date End Date Taking? Authorizing Provider  amLODipine (NORVASC) 10 MG tablet Take 10 mg by mouth every morning. 04/17/20   [provider]  ASPIRIN LOW DOSE 81 MG EC tablet Take 81 mg by mouth in the morning. 02/22/21   [provider]  atorvastatin (LIPITOR) 40 MG tablet Take 40 mg by mouth in the morning. 02/09/21   [provider]  calcium acetate (PHOSLO) 667 MG capsule Take 2 capsules (1,334 mg total) by mouth with breakfast, with lunch, and with evening meal. 04/10/21   Calvert Cantor, MD  carvedilol (COREG) 12.5 MG tablet Take 12.5 mg by mouth 2 (two) times daily with a meal. 04/17/20   [provider]  Cholecalciferol (VITAMIN D) 50 MCG (2000 UT) CAPS Take 2,000 Units by mouth daily.    [provider]  clopidogrel (PLAVIX) 75 MG tablet Take 1 tablet (75 mg total) by mouth daily. 11/02/22   Rhyne, Ames Coupe, PA-C  cyclobenzaprine (FLEXERIL) 10 MG tablet Take 10 mg by mouth 3 (three) times daily as needed for muscle spasms. 12/29/21   [provider]  diazepam (VALIUM) 5 MG tablet Take 5 mg by mouth 2 (two) times daily as needed for muscle spasms. 12/09/21   [provider]  insulin regular human CONCENTRATED (HUMULIN R U-500 KWIKPEN) 500 UNIT/ML KwikPen Inject 45 Units into the skin daily with supper. Patient taking  differently: Inject 125 Units into the skin 2 (two) times daily with a meal. 09/04/21 08/30/24  [name], MD  metolazone (ZAROXOLYN) 10 MG tablet Take 10 mg by mouth daily. 07/13/21   [provider]  nortriptyline (PAMELOR) 10 MG capsule Take 20 mg by mouth at bedtime. 10/23/19 08/30/24  [provider]  oxyCODONE (ROXICODONE) 5 MG immediate release tablet Take 1 tablet (5 mg total) by mouth every 8 (eight) hours as needed. Patient taking differently: Take 5 mg by mouth every 8 (eight) hours as needed for moderate pain. 12/31/22 12/31/23  [name], [name], DO  pantoprazole (PROTONIX) 40 MG tablet Take 40 mg by mouth daily before breakfast. 02/09/21   [provider]  pregabalin (LYRICA) 75 MG capsule Take 1 capsule (75 mg total) by mouth See admin instructions. Daily.  Give after dialysis on dialysis days. 01/21/23   [name], MD  sertraline (ZOLOFT) 100 MG tablet Take 100 mg by mouth in the morning. 02/22/21   [provider]  tadalafil (CIALIS) 20 MG tablet Take 20 mg by mouth daily as needed for erectile dysfunction.    [provider]  tamsulosin (FLOMAX) 0.4 MG CAPS capsule Take 1 capsule (0.4 mg total) by mouth daily. 11/10/21   [name], MD  torsemide (DEMADEX) 20 MG tablet Take 120 mg by mouth 2 (two) times daily.    [provider]  traZODone (  DESYREL) 150 MG tablet Take 150 mg by mouth at bedtime. 12/09/21   [provider]      Allergies    Actos [pioglitazone], Dexmedetomidine, Ibuprofen, Tomato, and Wellbutrin [bupropion]    Review of Systems   Review of Systems  Constitutional:  Negative for fever.  Respiratory:  Negative for shortness of breath.   Cardiovascular:  Negative for chest pain.    Physical Exam Updated Vital Signs BP (!) 134/59   Pulse 91   Temp 98.4 F (36.9 C) (Oral)   Resp 18   SpO2 98%  Physical Exam Vitals and nursing note reviewed.  Constitutional:      General: He is not in acute  distress.    Appearance: Normal appearance. He is not toxic-appearing.  Eyes:     General: No scleral icterus. Pulmonary:     Effort: Pulmonary effort is normal. No respiratory distress.  Musculoskeletal:     Comments: Left  AKA.  Skin:    General: Skin is dry.  Neurological:     Mental Status: He is alert. Mental status is at baseline.  Psychiatric:        Mood and Affect: Mood normal.     ED Results / Procedures / Treatments   Labs (all labs ordered are listed, but only abnormal results are displayed) Labs Reviewed  CBC  RENAL FUNCTION PANEL    EKG None  Radiology No results found.  Procedures Procedures   Medications Ordered in ED Medications  Chlorhexidine Gluconate Cloth 2 % PADS 6 each (has no administration in time range)    ED Course/ Medical Decision Making/ A&P Clinical Course as of 03/28/23 0855  Tue Mar 28, 2023  0811 Spoke with Dr Hendricks Milo with nephrology who will place orders for the patient's dialysis  [RR]    Clinical Course User Index [RR] [name], PA-C                           Medical Decision Making Amount and/or Complexity of Data Reviewed Labs: ordered.  62 y.o. male presents to the ER today for evaluation of needing dilaysis. Differential diagnosis includes but is not limited to hyperkalemia, elevated creatinine. Vital signs show mildly elevated BP, otherwise unremarkable. Physical exam as noted above.   I independently reviewed and interpreted the patient's labs.  CBC shows chronic anemia and thrombocytopenia around his baseline.  Renal function panel shows mildly decreased chloride or although around his baseline.  Glucose elevated to 28.  Creatinine around his baseline.  Mild decrease in calcium.  His anion gap is 18 however.  Healing is minor changes to his sodium and bicarb, will likely correct with hemodialysis.  I consulted nephrology and spoke with Dr. Arlean Hopping who will arrange dialysis for the patient.   Patient is at  dialysis. Anticipate discharged home afterwards.   Portions of this report may have been transcribed using voice recognition software. Every effort was made to ensure accuracy; however, inadvertent computerized transcription errors may be present.     Final Clinical Impression(s) / ED Diagnoses Final diagnoses:  ESRD needing dialysis [facility])    Rx / DC Orders ED Discharge Orders     None         [name], PA-C 03/28/23 1600    Loetta Rough, MD 03/31/23 1234
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
## 2023-03-28 NOTE — Procedures (Signed)
We are asked to see this patient for dialysis. Pt was discharged from his OP HD unit in December 2023 due to behavioral issues. Pt came to ED today requesting hospital dialysis. Plan is for HD upstairs then will return to ED for reassessment and expected discharge home.    Last hep B labs here were from 03/23/23   If ordering darbepoeitin for this patient, do not use the prepared order of "SQ at 1800" as this patient has to leave the hospital premises by 5 pm to catch his ride home.     Date                Tsat/ ferritin     IV Fe dose       Darbe           Ca/alb/ phos/ pth   12/02/22            22%/ 935   01/24/23          31%/ 1056                                                       - / - / - /253   03/14/23                                                                                  7.9/3.5/4.4/ -   03/16/23                                                          60 mcg    I was present at this dialysis session, have reviewed the session itself and made  appropriate changes Vinson Moselle MD [facility] pager [phone]   03/28/2023, 12:27 PM

## 2023-03-28 NOTE — Progress Notes (Signed)
POST HD TX NOTE  03/28/23 1457  Vitals  Temp 98.2 F (36.8 C)  Temp Source Oral  BP 119/69  MAP (mmHg) 81  BP Location Left Arm  BP Method Automatic  Patient Position (if appropriate) Lying  Pulse Rate 81  Pulse Rate Source Monitor  ECG Heart Rate 81  Resp 14  Oxygen Therapy  SpO2 99 %  O2 Device Nasal Cannula  O2 Flow Rate (L/min) 3 L/min  Pulse Oximetry Type Continuous  During Treatment Monitoring  Intra-Hemodialysis Comments (S)   (post HD tx VS check)  Post Treatment  Dialyzer Clearance Lightly streaked  Duration of HD Treatment -hour(s) 3.25 hour(s)  Hemodialysis Intake (mL) 0 mL  Liters Processed 68.3  Fluid Removed (mL) 2500 mL  Tolerated HD Treatment Yes  Post-Hemodialysis Comments (S)  tx completed w/o problem, UF goal met, blood rinsed back, VSS. Medication Admin: Heparin 2000 units bolus at beginning of tx, Heparin 2000 units bolus mid tx, Heparin Dwells 3800 units  Hemodialysis Catheter Left Internal jugular Double lumen Permanent (Tunneled)  Placement Date/Time: 03/03/23 0856   Serial / Lot #: [id number]  Expiration Date: 08/30/27  Time Out: Correct patient;Correct site;Correct procedure  Maximum sterile barrier precautions: Hand hygiene;Cap;Mask;Sterile gown;Sterile gloves;Large sterile ...  Site Condition No complications  Blue Lumen Status Heparin locked;Dead end cap in place  Red Lumen Status Heparin locked;Dead end cap in place  Purple Lumen Status N/A  Catheter fill solution Heparin 1000 units/ml  Catheter fill volume (Arterial) 1.9 cc  Catheter fill volume (Venous) 1.9  Dressing Type Transparent  Dressing Status Antimicrobial disc in place;Clean, Dry, Intact  Drainage Description None  Dressing Change Due 04/04/23  Post treatment catheter status Capped and Clamped

## 2023-03-30 ENCOUNTER — Encounter (HOSPITAL_COMMUNITY): Payer: Self-pay

## 2023-03-30 ENCOUNTER — Emergency Department (HOSPITAL_COMMUNITY)
Admission: EM | Admit: 2023-03-30 | Discharge: 2023-03-30 | Disposition: A | Discharge status: Home / Self Care | Payer: 59 | Attending: Emergency Medicine | Admitting: Emergency Medicine

## 2023-03-30 ENCOUNTER — Other Ambulatory Visit: Payer: Self-pay

## 2023-03-30 DIAGNOSIS — N186 End stage renal disease: Secondary | ICD-10-CM | POA: Diagnosis present

## 2023-03-30 DIAGNOSIS — Z992 Dependence on renal dialysis: Secondary | ICD-10-CM | POA: Diagnosis not present

## 2023-03-30 DIAGNOSIS — D631 Anemia in chronic kidney disease: Secondary | ICD-10-CM | POA: Insufficient documentation

## 2023-03-30 LAB — CBC
HCT: 26.1 % — ABNORMAL LOW (ref 39.0–52.0)
Hemoglobin: 8 g/dL — ABNORMAL LOW (ref 13.0–17.0)
MCH: 28 pg (ref 26.0–34.0)
MCHC: 30.7 g/dL (ref 30.0–36.0)
MCV: 91.3 fL (ref 80.0–100.0)
Platelets: 96 10*3/uL — ABNORMAL LOW (ref 150–400)
RBC: 2.86 MIL/uL — ABNORMAL LOW (ref 4.22–5.81)
RDW: 14.2 % (ref 11.5–15.5)
WBC: 5.3 10*3/uL (ref 4.0–10.5)
nRBC: 0 % (ref 0.0–0.2)

## 2023-03-30 LAB — RENAL FUNCTION PANEL
Albumin: 3.6 g/dL (ref 3.5–5.0)
Anion gap: 9 (ref 5–15)
BUN: 73 mg/dL — ABNORMAL HIGH (ref 8–23)
CO2: 30 mmol/L (ref 22–32)
Calcium: 8.6 mg/dL — ABNORMAL LOW (ref 8.9–10.3)
Chloride: 94 mmol/L — ABNORMAL LOW (ref 98–111)
Creatinine, Ser: 5.47 mg/dL — ABNORMAL HIGH (ref 0.61–1.24)
GFR, Estimated: 11 mL/min — ABNORMAL LOW (ref >60)
Glucose, Bld: 316 mg/dL — ABNORMAL HIGH (ref 70–99)
Phosphorus: 4.5 mg/dL (ref 2.5–4.6)
Potassium: 4 mmol/L (ref 3.5–5.1)
Sodium: 133 mmol/L — ABNORMAL LOW (ref 135–145)

## 2023-03-30 MED ORDER — CHLORHEXIDINE GLUCONATE CLOTH 2 % EX PADS: 6.0000 | MEDICATED_PAD | Freq: Every day | CUTANEOUS | Status: DC

## 2023-03-30 MED ORDER — HEPARIN SODIUM (PORCINE) 1000 UNIT/ML DIALYSIS
2000.0000 [IU] | Freq: Once | INTRAMUSCULAR | Status: AC
  Administered 2023-03-30: 2000 [IU] via INTRAVENOUS_CENTRAL
  Filled 2023-03-30: qty 2

## 2023-03-30 MED ORDER — DARBEPOETIN ALFA 60 MCG/0.3ML IJ SOSY
60.0000 ug | PREFILLED_SYRINGE | Freq: Once | INTRAMUSCULAR | Status: AC
  Administered 2023-03-30: 60 ug via SUBCUTANEOUS
  Filled 2023-03-30: qty 0.3

## 2023-03-30 NOTE — ED Provider Notes (Signed)
[place] EMERGENCY DEPARTMENT AT [facility] Provider Note   CSN: [id number] Arrival date & time: 03/30/23  0725     History  Chief Complaint  Patient presents with   Vascular Access Problem    Jeffrey [name] is a 62 y.o. male.  He has a history of end-stage renal disease.  He said he is here to get dialysis.  He has been discharged from his regular dialysis center and apparently comes to the ED Tuesday Thursday and Saturday for his dialysis.  His access is aligned in his left upper chest.  He tells me that Dr. Randie Heinz can be putting in a fistula next week.  He denies any other medical complaints.  He is on oxygen 3 L at baseline.  No fevers or chills no chest pain or shortness of breath.  The history is provided by the patient.       Home Medications Prior to Admission medications   Medication Sig Start Date End Date Taking? Authorizing Provider  amLODipine (NORVASC) 10 MG tablet Take 10 mg by mouth every morning. 04/17/20   [provider]  ASPIRIN LOW DOSE 81 MG EC tablet Take 81 mg by mouth in the morning. 02/22/21   [provider]  atorvastatin (LIPITOR) 40 MG tablet Take 40 mg by mouth in the morning. 02/09/21   [provider]  calcium acetate (PHOSLO) 667 MG capsule Take 2 capsules (1,334 mg total) by mouth with breakfast, with lunch, and with evening meal. 04/10/21   Calvert Cantor, MD  carvedilol (COREG) 12.5 MG tablet Take 12.5 mg by mouth 2 (two) times daily with a meal. 04/17/20   [provider]  Cholecalciferol (VITAMIN D) 50 MCG (2000 UT) CAPS Take 2,000 Units by mouth daily.    [provider]  clopidogrel (PLAVIX) 75 MG tablet Take 1 tablet (75 mg total) by mouth daily. 11/02/22   Rhyne, Ames Coupe, PA-C  cyclobenzaprine (FLEXERIL) 10 MG tablet Take 10 mg by mouth 3 (three) times daily as needed for muscle spasms. 12/29/21   [provider]  diazepam (VALIUM) 5 MG tablet Take 5 mg by mouth 2 (two) times daily as  needed for muscle spasms. 12/09/21   [provider]  insulin regular human CONCENTRATED (HUMULIN R U-500 KWIKPEN) 500 UNIT/ML KwikPen Inject 45 Units into the skin daily with supper. Patient taking differently: Inject 125 Units into the skin 2 (two) times daily with a meal. 09/04/21 08/30/24  [name], MD  metolazone (ZAROXOLYN) 10 MG tablet Take 10 mg by mouth daily. 07/13/21   [provider]  nortriptyline (PAMELOR) 10 MG capsule Take 20 mg by mouth at bedtime. 10/23/19 08/30/24  [provider]  oxyCODONE (ROXICODONE) 5 MG immediate release tablet Take 1 tablet (5 mg total) by mouth every 8 (eight) hours as needed. Patient taking differently: Take 5 mg by mouth every 8 (eight) hours as needed for moderate pain. 12/31/22 12/31/23  [name], [name], DO  pantoprazole (PROTONIX) 40 MG tablet Take 40 mg by mouth daily before breakfast. 02/09/21   [provider]  pregabalin (LYRICA) 75 MG capsule Take 1 capsule (75 mg total) by mouth See admin instructions. Daily.  Give after dialysis on dialysis days. 01/21/23   [name], MD  sertraline (ZOLOFT) 100 MG tablet Take 100 mg by mouth in the morning. 02/22/21   [provider]  tadalafil (CIALIS) 20 MG tablet Take 20 mg by mouth daily as needed for erectile dysfunction.  [provider]  tamsulosin (FLOMAX) 0.4 MG CAPS capsule Take 1 capsule (0.4 mg total) by mouth daily. 11/10/21   [name], MD  torsemide (DEMADEX) 20 MG tablet Take 120 mg by mouth 2 (two) times daily.    [provider]  traZODone (DESYREL) 150 MG tablet Take 150 mg by mouth at bedtime. 12/09/21   [provider]      Allergies    Actos [pioglitazone], Dexmedetomidine, Ibuprofen, Tomato, and Wellbutrin [bupropion]    Review of Systems   Review of Systems  Constitutional:  Negative for fever.  Respiratory:  Negative for shortness of breath.   Cardiovascular:  Negative for chest pain.    Physical  Exam Updated Vital Signs BP (!) 133/56 (BP Location: Right Arm)   Pulse 98   Temp 98 F (36.7 C) (Oral)   Resp 16   Ht 5\' 5"  (1.651 m)   Wt 133 kg   SpO2 100%   BMI 48.79 kg/m  Physical Exam Vitals and nursing note reviewed.  Constitutional:      General: He is not in acute distress.    Appearance: Normal appearance. He is well-developed.  HENT:     Head: Normocephalic and atraumatic.  Cardiovascular:     Rate and Rhythm: Normal rate and regular rhythm.     Heart sounds: No murmur heard.    Comments: He has vascular access line left upper chest without any erythema or tenderness Pulmonary:     Effort: Pulmonary effort is normal. No respiratory distress.     Breath sounds: Normal breath sounds.  Abdominal:     Palpations: Abdomen is soft.     Tenderness: There is no abdominal tenderness.  Musculoskeletal:     Cervical back: Neck supple.  Skin:    General: Skin is warm and dry.  Neurological:     Mental Status: He is alert.     ED Results / Procedures / Treatments   Labs (all labs ordered are listed, but only abnormal results are displayed) Labs Reviewed  RENAL FUNCTION PANEL - Abnormal; Notable for the following components:      Result Value   Sodium 133 (*)    Chloride 94 (*)    Glucose, Bld 316 (*)    BUN 73 (*)    Creatinine, Ser 5.47 (*)    Calcium 8.6 (*)    GFR, Estimated 11 (*)    All other components within normal limits  CBC - Abnormal; Notable for the following components:   RBC 2.86 (*)    Hemoglobin 8.0 (*)    HCT 26.1 (*)    Platelets 96 (*)    All other components within normal limits    EKG None  Radiology No results found.  Procedures Procedures    Medications Ordered in ED Medications - No data to display  ED Course/ Medical Decision Making/ A&P Clinical Course as of 03/30/23 1730  Thu Mar 30, 2023  0825 Discussed with Dr. Arlean Hopping nephrology.  He will put him on the schedule for dialysis.  No need for labs or any other  interventions needed prior to dialysis [MB]    Clinical Course User Index [MB] Terrilee Files, MD                             Medical Decision Making Amount and/or Complexity of Data Reviewed Labs: ordered.   This patient complains of needing dialysis; this involves an extensive  number of treatment Options and is a complaint that carries with it a high risk of complications and morbidity. The differential includes hyperkalemia, fluid overload, metabolic derangement  I ordered, reviewed and interpreted labs, which included CBC with normal white count stable hemoglobin and platelets, chemistries consistent with end-stage renal disease Previous records obtained and reviewed in epic including prior ED visits I consulted nephrology Dr. Arlean Hopping and discussed lab and imaging findings and discussed disposition.  Cardiac monitoring reviewed, sinus rhythm Social determinants considered, no significant barriers Critical Interventions: None  After the interventions stated above, I reevaluated the patient and found patient to be resting comfortably in no distress Admission and further testing considered, he will be moved up for dialysis when a spot is available.  Anticipate being able to be discharged once dialysis complete.         Final Clinical Impression(s) / ED Diagnoses Final diagnoses:  ESRD (end stage renal disease) (HCC)    Rx / DC Orders ED Discharge Orders     None         Terrilee Files, MD 03/30/23 1732

## 2023-03-30 NOTE — Procedures (Deleted)
We are asked to see this patient for dialysis. Pt was discharged from his OP HD unit in December 2023 due to behavioral issues. Pt came to ED today requesting hospital dialysis. Plan is for HD upstairs then will return to ED for reassessment and expected discharge home.    Last hep B labs here were from 03/23/23   Last OP HD orders (dec 2023):  3:15h  129kg  RUE AVG   Heparin 2000    If ordering darbepoeitin for this patient, do not use the prepared order of "SQ at 1800" as this patient has to leave the hospital premises by 5 pm to catch his ride home.    Due for esa, will give darbe 60 mcg sq today.     Date                Tsat/ ferritin     IV Fe       Darbe SQ       Ca/alb/ phos/ pth   12/02/22            22%/ 935   01/24/23          31%/ 1056                                                       - / - / - /253   03/14/23                                                                                  7.9/3.5/4.4/ -   03/16/23                                                   60 mcg   03/30/23          60 mcg   I was present at this dialysis session, have reviewed the session itself and made  appropriate changes Vinson Moselle MD [facility] pager [phone]   03/30/2023, 4:28 PM

## 2023-03-30 NOTE — ED Triage Notes (Signed)
Pt. Here for dialysis. Last Treatment Tuesday . Full time.

## 2023-03-30 NOTE — Procedures (Addendum)
We are asked to see this patient for dialysis. Pt was discharged from his OP HD unit in December 2023 due to behavioral issues. Pt came to ED today requesting hospital dialysis. Plan is for HD upstairs then will return to ED for reassessment and expected discharge home.    Last hep B labs here were from 03/23/23   Last OP HD orders (dec 2023):  3:15h  129kg  RUE AVG   Heparin 2000    If ordering darbepoeitin for this patient, do not use the prepared order of "SQ at 1800" as this patient has to leave the hospital premises by 5 pm to catch his ride home.    Due for esa, will darbe 60 mcg sq today.     Date                Tsat/ ferritin     IV Fe dose       Darbe SQ        Ca/alb/ phos/ pth   12/02/22            22%/ 935   01/24/23          31%/ 1056                                                       - / - / - /253   03/14/23                                                                                  7.9/3.5/4.4/ -   03/16/23                                                          60 mcg   03/30/23       60 mcg   I was present at this dialysis session, have reviewed the session itself and made  appropriate changes Vinson Moselle MD [facility] pager [phone]   03/30/2023, 4:31 PM

## 2023-03-30 NOTE — ED Notes (Signed)
To dialysis via stretcher.

## 2023-03-30 NOTE — ED Notes (Signed)
Received patient on ED stretcher to unit.  Alert and oriented.  Informed consent signed and in chart.   TX duration: 2.34hrs Patient ended tx with left on the machine d/t his "ride leaving soon" as verbalized by patient.   Patient tolerated well.  Alert, without acute distress.  Hand-off given to patient's nurse.   Access used: L IJ Access issues: None  Total UF removed: Medication(s) given: Aranesp SQ   03/30/23 1609  Vitals  Temp 97.8 F (36.6 C)  Temp Source Oral  BP (!) 142/77  MAP (mmHg) 92  BP Location Left Arm  BP Method Automatic  Patient Position (if appropriate) Lying  Pulse Rate 82  Pulse Rate Source Monitor  ECG Heart Rate 79  Resp 13  Oxygen Therapy  SpO2 100 %  O2 Device Nasal Cannula  O2 Flow Rate (L/min) 3 L/min  Pulse Oximetry Type Continuous  During Treatment Monitoring  Intra-Hemodialysis Comments Tx completed;Tolerated well  Post Treatment  Dialyzer Clearance Heavily streaked  Duration of HD Treatment -hour(s) 2.6 hour(s)  Liters Processed 54  Fluid Removed (mL) 1900 mL  Tolerated HD Treatment Yes  Hemodialysis Catheter Left Internal jugular Double lumen Permanent (Tunneled)  Placement Date/Time: 03/03/23 0856   Serial / Lot #: [id number]  Expiration Date: 08/30/27  Time Out: Correct patient;Correct site;Correct procedure  Maximum sterile barrier precautions: Hand hygiene;Cap;Mask;Sterile gown;Sterile gloves;Large sterile ...  Site Condition No complications  Blue Lumen Status Dead end cap in place;Heparin locked  Red Lumen Status Dead end cap in place;Heparin locked  Purple Lumen Status N/A  Catheter fill solution Heparin 1000 units/ml  Catheter fill volume (Arterial) 1.9 cc  Catheter fill volume (Venous) 1.9  Dressing Type Transparent  Dressing Status Antimicrobial disc in place;Clean, Dry, Intact  Interventions Other (Comment)  Drainage Description None  Dressing Change Due 04/04/23  Post treatment catheter status  Capped and Clamped     Lexianna Weinrich [facility]

## 2023-04-04 ENCOUNTER — Emergency Department (HOSPITAL_COMMUNITY)
Admission: EM | Admit: 2023-04-04 | Discharge: 2023-04-04 | Disposition: A | Discharge status: Home / Self Care | Payer: 59 | Attending: Emergency Medicine | Admitting: Emergency Medicine

## 2023-04-04 ENCOUNTER — Encounter (HOSPITAL_COMMUNITY): Payer: Self-pay

## 2023-04-04 ENCOUNTER — Other Ambulatory Visit: Payer: Self-pay

## 2023-04-04 DIAGNOSIS — N186 End stage renal disease: Secondary | ICD-10-CM

## 2023-04-04 DIAGNOSIS — Z7982 Long term (current) use of aspirin: Secondary | ICD-10-CM | POA: Diagnosis not present

## 2023-04-04 DIAGNOSIS — Z79899 Other long term (current) drug therapy: Secondary | ICD-10-CM | POA: Diagnosis not present

## 2023-04-04 DIAGNOSIS — Z794 Long term (current) use of insulin: Secondary | ICD-10-CM | POA: Diagnosis not present

## 2023-04-04 DIAGNOSIS — Z992 Dependence on renal dialysis: Secondary | ICD-10-CM | POA: Diagnosis present

## 2023-04-04 LAB — RENAL FUNCTION PANEL
Albumin: 3.6 g/dL (ref 3.5–5.0)
Anion gap: 17 — ABNORMAL HIGH (ref 5–15)
BUN: 110 mg/dL — ABNORMAL HIGH (ref 8–23)
CO2: 29 mmol/L (ref 22–32)
Calcium: 7.8 mg/dL — ABNORMAL LOW (ref 8.9–10.3)
Chloride: 88 mmol/L — ABNORMAL LOW (ref 98–111)
Creatinine, Ser: 6.41 mg/dL — ABNORMAL HIGH (ref 0.61–1.24)
GFR, Estimated: 9 mL/min — ABNORMAL LOW (ref >60)
Glucose, Bld: 263 mg/dL — ABNORMAL HIGH (ref 70–99)
Phosphorus: 5.7 mg/dL — ABNORMAL HIGH (ref 2.5–4.6)
Potassium: 3.7 mmol/L (ref 3.5–5.1)
Sodium: 134 mmol/L — ABNORMAL LOW (ref 135–145)

## 2023-04-04 LAB — CBC
HCT: 26.8 % — ABNORMAL LOW (ref 39.0–52.0)
Hemoglobin: 8.6 g/dL — ABNORMAL LOW (ref 13.0–17.0)
MCH: 28.7 pg (ref 26.0–34.0)
MCHC: 32.1 g/dL (ref 30.0–36.0)
MCV: 89.3 fL (ref 80.0–100.0)
Platelets: 130 10*3/uL — ABNORMAL LOW (ref 150–400)
RBC: 3 MIL/uL — ABNORMAL LOW (ref 4.22–5.81)
RDW: 14.5 % (ref 11.5–15.5)
WBC: 6.5 10*3/uL (ref 4.0–10.5)
nRBC: 0 % (ref 0.0–0.2)

## 2023-04-04 MED ORDER — HEPARIN SODIUM (PORCINE) 1000 UNIT/ML IJ SOLN
INTRAMUSCULAR | Status: AC
  Administered 2023-04-04: 3800 [IU]
  Filled 2023-04-04: qty 4

## 2023-04-04 MED ORDER — CHLORHEXIDINE GLUCONATE CLOTH 2 % EX PADS: 6.0000 | MEDICATED_PAD | Freq: Every day | CUTANEOUS | Status: DC

## 2023-04-04 NOTE — ED Provider Notes (Signed)
[place] EMERGENCY DEPARTMENT AT [facility] Provider Note   CSN: [id number] Arrival date & time: 04/04/23  4098     History  Chief Complaint  Patient presents with   needs dialysis    Jeffrey [name] is a 62 y.o. male with a past medical history significant for ESRD who presents to the ED requesting dialysis.  Patient has been discharged from his regular dialysis center and comes to the ED Tuesday, Thursday, and Saturday for dialysis.  Last dialysis session was 5/2, states he skipped Saturday.  Denies any worsening shortness of breath.  Patient currently asymptomatic.  On chronic 3 L nasal cannula at baseline.  No chest pain or lower extremity edema.  Denies fever.  Patient is scheduled for left arm fistula placement on 5/9 with Dr. Randie Heinz.  History obtained from patient and past medical records. No interpreter used during encounter.       Home Medications Prior to Admission medications   Medication Sig Start Date End Date Taking? Authorizing Provider  amLODipine (NORVASC) 10 MG tablet Take 10 mg by mouth every morning. 04/17/20   [provider]  ASPIRIN LOW DOSE 81 MG EC tablet Take 81 mg by mouth in the morning. 02/22/21   [provider]  atorvastatin (LIPITOR) 40 MG tablet Take 40 mg by mouth in the morning. 02/09/21   [provider]  calcium acetate (PHOSLO) 667 MG capsule Take 2 capsules (1,334 mg total) by mouth with breakfast, with lunch, and with evening meal. 04/10/21   Calvert Cantor, MD  carvedilol (COREG) 12.5 MG tablet Take 12.5 mg by mouth 2 (two) times daily with a meal. 04/17/20   [provider]  Cholecalciferol (VITAMIN D) 50 MCG (2000 UT) CAPS Take 2,000 Units by mouth daily.    [provider]  clopidogrel (PLAVIX) 75 MG tablet Take 1 tablet (75 mg total) by mouth daily. 11/02/22   Rhyne, Ames Coupe, PA-C  cyclobenzaprine (FLEXERIL) 10 MG tablet Take 10 mg by mouth 3 (three) times daily as needed for muscle spasms.  12/29/21   [provider]  diazepam (VALIUM) 5 MG tablet Take 5 mg by mouth 2 (two) times daily as needed for muscle spasms. 12/09/21   [provider]  insulin regular human CONCENTRATED (HUMULIN R U-500 KWIKPEN) 500 UNIT/ML KwikPen Inject 45 Units into the skin daily with supper. Patient taking differently: Inject 125 Units into the skin 2 (two) times daily with a meal. 09/04/21 08/30/24  [name], MD  metolazone (ZAROXOLYN) 10 MG tablet Take 10 mg by mouth daily. 07/13/21   [provider]  nortriptyline (PAMELOR) 10 MG capsule Take 20 mg by mouth at bedtime. 10/23/19 08/30/24  [provider]  oxyCODONE (ROXICODONE) 5 MG immediate release tablet Take 1 tablet (5 mg total) by mouth every 8 (eight) hours as needed. Patient taking differently: Take 5 mg by mouth every 8 (eight) hours as needed for moderate pain. 12/31/22 12/31/23  [name], [name], DO  pantoprazole (PROTONIX) 40 MG tablet Take 40 mg by mouth daily before breakfast. 02/09/21   [provider]  pregabalin (LYRICA) 75 MG capsule Take 1 capsule (75 mg total) by mouth See admin instructions. Daily.  Give after dialysis on dialysis days. 01/21/23   [name], MD  sertraline (ZOLOFT) 100 MG tablet Take 100 mg by mouth in the morning. 02/22/21   [provider]  tadalafil (CIALIS) 20 MG tablet Take 20 mg by mouth daily as needed for erectile  dysfunction.    [provider]  tamsulosin (FLOMAX) 0.4 MG CAPS capsule Take 1 capsule (0.4 mg total) by mouth daily. 11/10/21   [name], MD  torsemide (DEMADEX) 20 MG tablet Take 120 mg by mouth 2 (two) times daily.    [provider]  traZODone (DESYREL) 150 MG tablet Take 150 mg by mouth at bedtime. 12/09/21   [provider]      Allergies    Actos [pioglitazone], Dexmedetomidine, Ibuprofen, Tomato, and Wellbutrin [bupropion]    Review of Systems   Review of Systems  Constitutional:  Negative for fever.   Respiratory:  Negative for shortness of breath.   Cardiovascular:  Negative for chest pain.  Gastrointestinal:  Negative for abdominal pain.    Physical Exam Updated Vital Signs BP (!) 184/58   Pulse (!) 105   Temp 98.1 F (36.7 C) (Oral)   Resp 18   Ht 5\' 5"  (1.651 m)   Wt 133 kg   SpO2 98%   BMI 48.79 kg/m  Physical Exam Vitals and nursing note reviewed.  Constitutional:      General: He is not in acute distress.    Appearance: He is not ill-appearing.  HENT:     Head: Normocephalic.  Eyes:     Pupils: Pupils are equal, round, and reactive to light.  Cardiovascular:     Rate and Rhythm: Normal rate and regular rhythm.     Pulses: Normal pulses.     Heart sounds: Normal heart sounds. No murmur heard.    No friction rub. No gallop.  Pulmonary:     Effort: Pulmonary effort is normal.     Breath sounds: Normal breath sounds.  Abdominal:     General: Abdomen is flat. There is no distension.     Palpations: Abdomen is soft.     Tenderness: There is no abdominal tenderness. There is no guarding or rebound.  Musculoskeletal:        General: Normal range of motion.     Cervical back: Neck supple.  Skin:    General: Skin is warm and dry.  Neurological:     General: No focal deficit present.     Mental Status: He is alert.  Psychiatric:        Mood and Affect: Mood normal.        Behavior: Behavior normal.     ED Results / Procedures / Treatments   Labs (all labs ordered are listed, but only abnormal results are displayed) Labs Reviewed  CBC WITH DIFFERENTIAL/PLATELET  RENAL FUNCTION PANEL  HEPATITIS B SURFACE ANTIGEN  HEPATITIS B SURFACE ANTIBODY, QUANTITATIVE    EKG None  Radiology No results found.  Procedures Procedures    Medications Ordered in ED Medications  Chlorhexidine Gluconate Cloth 2 % PADS 6 each (has no administration in time range)    ED Course/ Medical Decision Making/ A&P                             Medical Decision  Making Amount and/or Complexity of Data Reviewed External Data Reviewed: notes. Labs: ordered. Decision-making details documented in ED Course.  This patient presents to the ED for concern of dialysis, this involves an extensive number of treatment options, and is a complaint that carries with it a high risk of complications and morbidity.  The differential diagnosis includes hyperkalemia, pulmonary edema, etc  62 year old male presents to the ED requesting dialysis.  History of  ESRD on dialysis Tuesday, Thursday, and Saturday.  Last dialysis was 5/2.  Patient has been discharged from his dialysis center and comes to the ED for dialysis.  Patient missed dialysis on Saturday.  Patient is currently asymptomatic.  Denies shortness of breath, chest pain, fever, chills.  Patient has a scheduled fistula placement on 5/9 by Dr. Randie Heinz.  Routine labs ordered.  Will discuss with nephrology for dialysis.  Spoke to Dr. Juel Burrow with nephrology who will coordinate dialysis for patient. Upon re evaluation, patient resting comfortably in bed. Awaiting for patient to be transported to dialysis.   Informed by RN unable to get access for labs. Discussed with dialysis who will obtain labs prior to dialysis.   Anticipate patient can be discharged after dialysis.   Has PCP       Final Clinical Impression(s) / ED Diagnoses Final diagnoses:  ESRD (end stage renal disease) [facility])    Rx / DC Orders ED Discharge Orders     None         Jesusita Oka 04/04/23 1153    Cathren Laine, MD 04/04/23 1251

## 2023-04-04 NOTE — ED Provider Notes (Signed)
Patient comfortable after dialysis and desires discharge.    Wynetta Fines, MD 04/04/23 [phone]

## 2023-04-04 NOTE — ED Notes (Signed)
The pt has left the building.   PT declined VS or paperwok

## 2023-04-04 NOTE — Progress Notes (Signed)
Pt asked to come off early due to cramping.

## 2023-04-04 NOTE — ED Notes (Signed)
Dialysis reports patient received 2hr 45 min of tx.  Experienced some cramping.  Received a bolus to help with cramping.  2.3 L  removed.  Last set of VS: 166/69.  Catheter dressing changed.

## 2023-04-04 NOTE — Progress Notes (Signed)
  We are asked to see this patient for dialysis. Pt was discharged from his OP HD unit in December 2023 due to behavioral issues. Pt came to ED today requesting hospital dialysis. Plan is for HD upstairs then will return to ED for reassessment and expected discharge home.  Catheter was not functioning well last week; contacted Dr. Randie Heinz to exchange also during new access placement on Thur. Will try to get him through a treatment today.   Last hep B labs here were from 03/23/23   Last OP HD orders (dec 2023):  3:15h  129kg  RUE AVG   Heparin 2000    If ordering darbepoeitin for this patient, do not use the prepared order of "SQ at 1800" as this patient has to leave the hospital premises by 5 pm to catch his ride home.    Usually gets darbo 60 but will need to check CBC 1st but shouldn't need it today.    Date                Tsat/ ferritin     IV Fe dose       Darbe SQ        Ca/alb/ phos/ pth   12/02/22            22%/ 935   01/24/23          31%/ 1056                                                       - / - / - /253   03/14/23                                                                                  7.9/3.5/4.4/ -   03/16/23                                                          60 mcg   03/30/23                                                            60 mcg

## 2023-04-04 NOTE — ED Notes (Signed)
Phleb was unable to obtain labs after multiple attempts.  Pt would like to wait and have them drawn at dialysis

## 2023-04-04 NOTE — ED Triage Notes (Signed)
Pt here to get dialysis. Pt states he missed dialysis Sat. Pt denies any complaints.

## 2023-04-04 NOTE — Progress Notes (Signed)
Received patient in bed.Awake,alert and oriented x 4.He has an oxygen at 3 lpm/[place].  Access used : Left HD catheter that worked well.Dressing changed done today.  Medicine given:None.  Duration of treatment 3 hours.  Fluid removed : 2,300 cc out of 3 liters prescribed.  Hemo issue:Patient refused to have 4 hours of treatment ,patient insisted on 3 hours treatment only. Last 15 minutes of his treatment ,he had a severe leg cramping, 100 cc NS ,given.  Hand off to the patient's nurse.

## 2023-04-05 ENCOUNTER — Encounter (HOSPITAL_COMMUNITY): Payer: Self-pay | Admitting: Physician Assistant

## 2023-04-05 ENCOUNTER — Other Ambulatory Visit: Payer: Self-pay

## 2023-04-05 ENCOUNTER — Encounter (HOSPITAL_COMMUNITY): Payer: Self-pay | Admitting: Vascular Surgery

## 2023-04-05 NOTE — Progress Notes (Addendum)
Anesthesia Chart Review: Jeffrey [name]  Case: [id number] Date/Time: 04/06/23 0854   Procedure: LEFT ARM ARTERIOVENOUS (AV) FISTULA CREATION VERSUS GRAFT (Left)   Anesthesia type: Choice   Pre-op diagnosis: End Stage Renal Disease   [facility] / [facility]   Surgeons: Maeola Harman, MD       DISCUSSION:  Patient is a 62 year old male with ESRD who is scheduled for the above procedure. He was discharged from his dialysis center in December 2023 due to behavioral issues, so he presents to the [facility] ED frequently to undergo hemodialysis. He is s/p ligation of RUE AVGG (for worsening arm pain and swelling with central venous stenosis) with placement of left IJ TDC on 03/03/23. Per 04/04/23 nephrology note, his catheter is not functioning well, so exchange and creation of new HD access is planned on 04/06/23.   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   There is a DIFFICULT INTUBATION documented as an [place]. Per 03/03/23 intubation record at [facility]: Induction Type: IV induction Laryngoscope Size: Glidescope and 4 Grade View: Grade I Tube type: Oral Tube size: 7.5 mm Number of attempts: 1 Airway Equipment and Method: Stylet Dental Injury: Teeth and Oropharynx as per pre-operative assessment  Difficulty Due To: Difficulty was anticipated Comments: Cords easily viewed with GlideGo 4, in sniffing position, extremely  large tongue and tonsils, mask ventilation not attempted     Last A1c 10.5% on 11/25/22. He in now on Regular insulin U-500 125 mg BID. On 02/28/23, while in ED for HD/RUE pain, DM RN Coordinator advised: "Outpatient Diabetes medications: U-500 125 mg BID Current orders for Inpatient glycemic control: U-500 125 units BID Inpatient Diabetes Program Recommendations:   Consider reducing U-500 to  75 units BID IF eating meals and adding Novolog 0-9 units TID & HS. "   Per OR Posting, "Plavix hold x 5 days"; however, he reported last dose of Plavix and ASA were on 04/05/23. RN staff to communicate to surgeon's staff for additional recommendations, if any.  Patient is for labs and anesthesia team evaluation on the day of surgery. Currently, last HD 04/04/23.     VS:  BP Readings from Last 3 Encounters:  04/04/23 (!) 168/66  03/30/23 (!) 157/71  03/28/23 119/69   Pulse Readings from Last 3 Encounters:  04/04/23 82  03/30/23 79  03/28/23 81     PROVIDERS: Knox Royalty, MD is PCP    LABS: For iSTAT on arrival. Currently, most recent labs in [facility] include:  Lab Results  Component Value Date   WBC 6.5 04/04/2023   HGB 8.6 (L) 04/04/2023   HCT 26.8 (L) 04/04/2023   PLT 130 (L) 04/04/2023   GLUCOSE 263 (H) 04/04/2023   ALT 16 01/19/2023   AST 15 01/19/2023   NA 134 (L) 04/04/2023   K 3.7 04/04/2023   CL 88 (L) 04/04/2023   CREATININE 6.41 (H) 04/04/2023   BUN 110 (H) 04/04/2023   CO2 29 04/04/2023   INR 1.1 10/19/2022   HGBA1C 10.5 (H) 11/25/2022    IMAGES: 1V PCXR 03/03/23: FINDINGS: Left sided central venous catheter with the tip at the cavoatrial junction. No pneumothorax. Vascular stent projects over the right hemithorax. Low lung volumes. Cardiomegaly. No pleural effusion.No focal airspace opacity. No radiographically apparent displaced rib fractures. Visualized upper abdomen is unremarkable.  IMPRESSION: Left sided central venous catheter with the tip at the cavoatrial junction. No pneumothorax.    EKG:  EKG 03/02/23: NSR     CV: Echo 11/08/21: IMPRESSIONS   1. Left ventricular ejection fraction, by estimation, is 60 to 65%. The  left ventricle has normal function. The left ventricle has no regional  wall motion abnormalities. Left ventricular diastolic parameters were  normal.   2. Right ventricular systolic function is normal. The right ventricular  size  is normal. Tricuspid regurgitation signal is inadequate for assessing  PA pressure.   3. The mitral valve is grossly normal. No evidence of mitral valve  regurgitation. No evidence of mitral stenosis.   4. The aortic valve is tricuspid. There is mild calcification of the  aortic valve. Aortic valve regurgitation is not visualized. Aortic valve  sclerosis/calcification is present, without any evidence of aortic  stenosis.   5. The inferior vena cava is normal in size with greater than 50%  respiratory variability, suggesting right atrial pressure of 3 mmHg.  - Comparison(s): No significant change from prior study.    Past Medical History:  Diagnosis Date   Altered mental status 05/04/2021   Arthritis    Blood transfusion without reported diagnosis    CHF (congestive heart failure) (HCC)    COPD (chronic obstructive pulmonary disease) (HCC)    Depression    Diabetes mellitus without complication (HCC)    type 2   Dyspnea    ESRD on hemodialysis (HCC)    Tues Thurs Sat   GERD (gastroesophageal reflux disease)    protonix   Heart murmur    as a child, no problems   Hypertension    Myocardial infarction (HCC)    PTSD (post-traumatic stress disorder)    Sepsis (HCC)    Sleep apnea    occasional uses CPAP   Uses powered wheelchair     Past Surgical History:  Procedure Laterality Date   A/V FISTULAGRAM N/A 08/30/2021   Procedure: A/V FISTULAGRAM;  Surgeon: Maeola Harman, MD;  Location: [facility] INVASIVE CV LAB;  Service: Cardiovascular;  Laterality: N/A;   A/V FISTULAGRAM Right 07/28/2022   Procedure: A/V Fistulagram;  Surgeon: Maeola Harman, MD;  Location: [facility] INVASIVE CV LAB;  Service: Cardiovascular;  Laterality: Right;   A/V FISTULAGRAM Right 09/05/2022   Procedure: A/V Fistulagram;  Surgeon: Maeola Harman, MD;  Location: [facility] INVASIVE CV LAB;  Service: Cardiovascular;  Laterality: Right;   A/V FISTULAGRAM Right 10/31/2022   Procedure: A/V  Fistulagram;  Surgeon: Maeola Harman, MD;  Location: [facility] INVASIVE CV LAB;  Service: Cardiovascular;  Laterality: Right;   A/V SHUNT INTERVENTION Right 10/31/2022   Procedure: A/V SHUNT INTERVENTION;  Surgeon: Maeola Harman, MD;  Location: [facility] INVASIVE CV LAB;  Service: Cardiovascular;  Laterality: Right;   A/V SHUNTOGRAM N/A 11/08/2021   Procedure: A/V SHUNTOGRAM;  Surgeon: Maeola Harman, MD;  Location: [facility] INVASIVE CV LAB;  Service: Cardiovascular;  Laterality: N/A;   AMPUTATION Left 04/03/2021   Procedure: LEFT ABOVE-THE-KNEE AMPUTATION;  Surgeon: Terance Hart, MD;  Location: [facility] OR;  Service: Orthopedics;  Laterality: Left;   AV FISTULA PLACEMENT Right 08/12/2021   Procedure: INSERTION OF ARTERIOVENOUS (AV) GORE-TEX GRAFT RIGHT ARM;  Surgeon: Nada Libman, MD;  Location: [facility] OR;  Service: Vascular;  Laterality: Right;   INSERTION OF DIALYSIS CATHETER N/A 11/25/2022   Procedure: INSERTION OF DIALYSIS CATHETER;  Surgeon: Lorin Glass, MD;  Location: [facility] ENDOSCOPY;  Service: Pulmonary;  Laterality:  N/A;   INSERTION OF DIALYSIS CATHETER N/A 03/03/2023   Procedure: INSERTION OF TUNNELED DIALYSIS CATHETER;  Surgeon: Maeola Harman, MD;  Location: [facility] OR;  Service: Vascular;  Laterality: N/A;   IR REMOVAL TUN CV CATH W/O FL  03/30/2021   JOINT REPLACEMENT     Bilateral knees   LIGATION ARTERIOVENOUS GORTEX GRAFT Right 03/03/2023   Procedure: LIGATION RIGHT ARM ARTERIOVENOUS GORTEX GRAFT;  Surgeon: Maeola Harman, MD;  Location: [facility] OR;  Service: Vascular;  Laterality: Right;   PERIPHERAL VASCULAR BALLOON ANGIOPLASTY Right 08/30/2021   Procedure: PERIPHERAL VASCULAR BALLOON ANGIOPLASTY;  Surgeon: Maeola Harman, MD;  Location: [facility] INVASIVE CV LAB;  Service: Cardiovascular;  Laterality: Right;   PERIPHERAL VASCULAR BALLOON ANGIOPLASTY Right 07/28/2022   Procedure: PERIPHERAL VASCULAR BALLOON ANGIOPLASTY;  Surgeon: Maeola Harman, MD;  Location: [facility] INVASIVE CV LAB;  Service: Cardiovascular;  Laterality: Right;  AVF   PERIPHERAL VASCULAR INTERVENTION  11/08/2021   Procedure: PERIPHERAL VASCULAR INTERVENTION;  Surgeon: Maeola Harman, MD;  Location: [facility] INVASIVE CV LAB;  Service: Cardiovascular;;  Central rt arm fistula   UPPER EXTREMITY VENOGRAPHY Right 08/10/2021   Procedure: UPPER EXTREMITY VENOGRAPHY;  Surgeon: Nada Libman, MD;  [facility] INVASIVE CV LAB;  Service: Cardiovascular;  Laterality: Right;    MEDICATIONS: No current facility-administered medications for this encounter.    amLODipine (NORVASC) 10 MG tablet   ASPIRIN LOW DOSE 81 MG EC tablet   atorvastatin (LIPITOR) 40 MG tablet   calcium acetate (PHOSLO) 667 MG capsule   carvedilol (COREG) 12.5 MG tablet   Cholecalciferol (VITAMIN D) 50 MCG (2000 UT) CAPS   clopidogrel (PLAVIX) 75 MG tablet   cyclobenzaprine (FLEXERIL) 10 MG tablet   diazepam (VALIUM) 5 MG tablet   insulin regular human CONCENTRATED (HUMULIN R U-500 KWIKPEN) 500 UNIT/ML KwikPen   metolazone (ZAROXOLYN) 10 MG tablet   nortriptyline (PAMELOR) 10 MG capsule   oxyCODONE (ROXICODONE) 5 MG immediate release tablet   pantoprazole (PROTONIX) 40 MG tablet   pregabalin (LYRICA) 75 MG capsule   sertraline (ZOLOFT) 100 MG tablet   tadalafil (CIALIS) 20 MG tablet   tamsulosin (FLOMAX) 0.4 MG CAPS capsule   torsemide (DEMADEX) 20 MG tablet   traZODone (DESYREL) 150 MG tablet    Shonna Chock, PA-C Surgical Short Stay/Anesthesiology [facility] Phone [phone] [facility] Phone [phone] 04/05/2023 12:26 PM

## 2023-04-05 NOTE — Progress Notes (Signed)
Dr. Darcella Cheshire office made aware of patients continuation of his Plavix.  Per Dr. Randie Heinz, ok to proceed with surgical procedure.

## 2023-04-05 NOTE — Progress Notes (Signed)
SDW call  Patient was given pre-op instructions over the phone. Patient verbalized understanding of instructions provided.    PCP - Dr. Knox Royalty Cardiologist -  Pulmonary:    PPM/ICD - Denies   Chest x-ray - 03/02/2023 EKG -  03/02/2023 Stress Test - ECHO - 11/08/2021 Cardiac Cath -   Sleep Study/sleep apnea/CPAP: Diagnosed with sleep apnea. States sometimes he uses his CPAP  Type II diabetic Fasting Blood sugar range: 100-500 How often check sugars: Three times a day.  Insulin regular (Humulin R Kwikpen); Day before surgery: Morning use 125 units (regular dose), Evening zero units.  Day of surgery zero units. If blood sugar is > 220 use 1/2 correction dose   Blood Thinner Instructions: Plavix, last dose 04/05/2023 Aspirin Instructions: Last dose 04/05/2023   ERAS Protcol - No, NPO PRE-SURGERY Ensure or G2-    COVID TEST- n/a    Anesthesia review: Yes. HTN, DM, ESRD, MI, CHF, sleep apnea   Patient denies shortness of breath, fever, cough and chest pain over the phone call  Your procedure is scheduled on Thursday Apr 06, 2023  Report to [facility] Main Entrance "A" at    A.M., then check in with the Admitting office.  Call this number if you have problems the morning of surgery:  [phone]   If you have any questions prior to your surgery date call [phone]: Open Monday-Friday 8am-4pm If you experience any cold or flu symptoms such as cough, fever, chills, shortness of breath, etc. between now and your scheduled surgery, please notify us at the above number    Remember:  Do not eat or drink after midnight the night before your surgery  Take these medicines the morning of surgery with A SIP OF WATER:  Amlodipine, atorvastatin cavedilol, protonix, lyrica, zoloft, flomax  As needed: Flexeri, valium, oxycodone  As of today, STOP taking any Aspirin (unless otherwise instructed by your surgeon) Aleve, Naproxen, Ibuprofen, Motrin, Advil, Goody's, BC's, all herbal  medications, fish oil, and all vitamins.

## 2023-04-05 NOTE — Anesthesia Preprocedure Evaluation (Signed)
Anesthesia Evaluation    Airway        Dental   Pulmonary former smoker          Cardiovascular hypertension,      Neuro/Psych    GI/Hepatic   Endo/Other  diabetes  Lab Results      Component                Value               Date                      HGBA1C                   10.5 (H)            11/25/2022             Renal/GU      Musculoskeletal   Abdominal   Peds  Hematology   Anesthesia Other Findings   Reproductive/Obstetrics                             Anesthesia Physical Anesthesia Plan  ASA:   Anesthesia Plan:    Post-op Pain Management:    Induction:   PONV Risk Score and Plan:   Airway Management Planned:   Additional Equipment:   Intra-op Plan:   Post-operative Plan:   Informed Consent:   Plan Discussed with:   Anesthesia Plan Comments: (PAT note written 04/05/2023 by Shonna Chock, PA-C.  There is a DIFFICULT INTUBATION documented as an [place]. Per 03/03/23 intubation record at [facility]: Induction Type: IV induction Laryngoscope Size: Glidescope and 4 Grade View: Grade I Tube type: Oral Tube size: 7.5 mm Number of attempts: 1 Airway Equipment and Method: Stylet Dental Injury: Teeth and Oropharynx as per pre-operative assessment  Difficulty Due To: Difficulty was anticipated Comments: Cords easily viewed with GlideGo 4, in sniffing position, extremely  large tongue and tonsils, mask ventilation not attempted   )       Anesthesia Quick Evaluation

## 2023-04-06 ENCOUNTER — Encounter (HOSPITAL_COMMUNITY): Payer: Self-pay | Admitting: Vascular Surgery

## 2023-04-06 ENCOUNTER — Observation Stay (HOSPITAL_COMMUNITY)
Admission: RE | Admit: 2023-04-06 | Discharge: 2023-04-08 | Disposition: A | Discharge status: Home / Self Care | Payer: 59 | Attending: Family Medicine | Admitting: Family Medicine

## 2023-04-06 ENCOUNTER — Encounter (HOSPITAL_COMMUNITY)
Admission: RE | Disposition: A | Discharge status: Home / Self Care | Payer: Self-pay | Source: Home / Self Care | Attending: Family Medicine

## 2023-04-06 DIAGNOSIS — Z992 Dependence on renal dialysis: Secondary | ICD-10-CM | POA: Insufficient documentation

## 2023-04-06 DIAGNOSIS — J449 Chronic obstructive pulmonary disease, unspecified: Secondary | ICD-10-CM | POA: Diagnosis not present

## 2023-04-06 DIAGNOSIS — E1122 Type 2 diabetes mellitus with diabetic chronic kidney disease: Secondary | ICD-10-CM | POA: Diagnosis not present

## 2023-04-06 DIAGNOSIS — I10 Essential (primary) hypertension: Secondary | ICD-10-CM | POA: Diagnosis present

## 2023-04-06 DIAGNOSIS — E1165 Type 2 diabetes mellitus with hyperglycemia: Secondary | ICD-10-CM | POA: Diagnosis not present

## 2023-04-06 DIAGNOSIS — Z96653 Presence of artificial knee joint, bilateral: Secondary | ICD-10-CM | POA: Diagnosis not present

## 2023-04-06 DIAGNOSIS — Z87891 Personal history of nicotine dependence: Secondary | ICD-10-CM | POA: Insufficient documentation

## 2023-04-06 DIAGNOSIS — F39 Unspecified mood [affective] disorder: Secondary | ICD-10-CM | POA: Diagnosis present

## 2023-04-06 DIAGNOSIS — I509 Heart failure, unspecified: Secondary | ICD-10-CM | POA: Insufficient documentation

## 2023-04-06 DIAGNOSIS — Z79899 Other long term (current) drug therapy: Secondary | ICD-10-CM | POA: Diagnosis not present

## 2023-04-06 DIAGNOSIS — G8929 Other chronic pain: Secondary | ICD-10-CM | POA: Diagnosis present

## 2023-04-06 DIAGNOSIS — Z7902 Long term (current) use of antithrombotics/antiplatelets: Secondary | ICD-10-CM | POA: Insufficient documentation

## 2023-04-06 DIAGNOSIS — Z7982 Long term (current) use of aspirin: Secondary | ICD-10-CM | POA: Diagnosis not present

## 2023-04-06 DIAGNOSIS — D649 Anemia, unspecified: Secondary | ICD-10-CM | POA: Insufficient documentation

## 2023-04-06 DIAGNOSIS — G4733 Obstructive sleep apnea (adult) (pediatric): Secondary | ICD-10-CM

## 2023-04-06 DIAGNOSIS — N401 Enlarged prostate with lower urinary tract symptoms: Secondary | ICD-10-CM | POA: Diagnosis not present

## 2023-04-06 DIAGNOSIS — N186 End stage renal disease: Principal | ICD-10-CM | POA: Insufficient documentation

## 2023-04-06 DIAGNOSIS — Z89612 Acquired absence of left leg above knee: Secondary | ICD-10-CM | POA: Insufficient documentation

## 2023-04-06 DIAGNOSIS — E1169 Type 2 diabetes mellitus with other specified complication: Secondary | ICD-10-CM

## 2023-04-06 DIAGNOSIS — I132 Hypertensive heart and chronic kidney disease with heart failure and with stage 5 chronic kidney disease, or end stage renal disease: Secondary | ICD-10-CM | POA: Insufficient documentation

## 2023-04-06 DIAGNOSIS — K219 Gastro-esophageal reflux disease without esophagitis: Secondary | ICD-10-CM | POA: Diagnosis present

## 2023-04-06 LAB — POCT I-STAT, CHEM 8
BUN: 96 mg/dL — ABNORMAL HIGH (ref 8–23)
Calcium, Ion: 0.93 mmol/L — ABNORMAL LOW (ref 1.15–1.40)
Chloride: 91 mmol/L — ABNORMAL LOW (ref 98–111)
Creatinine, Ser: 6.3 mg/dL — ABNORMAL HIGH (ref 0.61–1.24)
Glucose, Bld: 576 mg/dL (ref 70–99)
HCT: 27 % — ABNORMAL LOW (ref 39.0–52.0)
Hemoglobin: 9.2 g/dL — ABNORMAL LOW (ref 13.0–17.0)
Potassium: 4.1 mmol/L (ref 3.5–5.1)
Sodium: 132 mmol/L — ABNORMAL LOW (ref 135–145)
TCO2: 28 mmol/L (ref 22–32)

## 2023-04-06 LAB — SURGICAL PCR SCREEN
MRSA, PCR: POSITIVE — AB
Staphylococcus aureus: POSITIVE — AB

## 2023-04-06 LAB — GLUCOSE, CAPILLARY
Glucose-Capillary: 578 mg/dL (ref 70–99)
Glucose-Capillary: 553 mg/dL (ref 70–99)
Glucose-Capillary: 488 mg/dL — ABNORMAL HIGH (ref 70–99)
Glucose-Capillary: 313 mg/dL — ABNORMAL HIGH (ref 70–99)
Glucose-Capillary: 331 mg/dL — ABNORMAL HIGH (ref 70–99)
Glucose-Capillary: 313 mg/dL — ABNORMAL HIGH (ref 70–99)

## 2023-04-06 SURGERY — ARTERIOVENOUS (AV) FISTULA CREATION
Anesthesia: Choice | Laterality: Left

## 2023-04-06 MED ORDER — ACETAMINOPHEN 650 MG RE SUPP: 650.0000 mg | Freq: Four times a day (QID) | RECTAL | Status: DC | PRN

## 2023-04-06 MED ORDER — SORBITOL 70 % SOLN: 30.0000 mL | Status: DC | PRN

## 2023-04-06 MED ORDER — ANTICOAGULANT SODIUM CITRATE 4% (200MG/5ML) IV SOLN
5.0000 mL | Status: DC | PRN
  Filled 2023-04-06: qty 5

## 2023-04-06 MED ORDER — METOLAZONE 5 MG PO TABS
10.0000 mg | ORAL_TABLET | Freq: Every day | ORAL | Status: DC
  Administered 2023-04-08: 10 mg via ORAL
  Filled 2023-04-06 (×3): qty 2

## 2023-04-06 MED ORDER — CYCLOBENZAPRINE HCL 10 MG PO TABS: 10.0000 mg | ORAL_TABLET | Freq: Three times a day (TID) | ORAL | Status: DC | PRN

## 2023-04-06 MED ORDER — DARBEPOETIN ALFA 60 MCG/0.3ML IJ SOSY
60.0000 ug | PREFILLED_SYRINGE | INTRAMUSCULAR | Status: DC
  Filled 2023-04-06: qty 0.3

## 2023-04-06 MED ORDER — TAMSULOSIN HCL 0.4 MG PO CAPS
0.4000 mg | ORAL_CAPSULE | Freq: Every day | ORAL | Status: DC
  Administered 2023-04-08: 0.4 mg via ORAL
  Filled 2023-04-06: qty 1

## 2023-04-06 MED ORDER — PANTOPRAZOLE SODIUM 40 MG PO TBEC
40.0000 mg | DELAYED_RELEASE_TABLET | Freq: Every day | ORAL | Status: DC
  Administered 2023-04-08: 40 mg via ORAL
  Filled 2023-04-06: qty 1

## 2023-04-06 MED ORDER — AMLODIPINE BESYLATE 10 MG PO TABS
10.0000 mg | ORAL_TABLET | Freq: Every morning | ORAL | Status: DC
  Administered 2023-04-08: 10 mg via ORAL
  Filled 2023-04-06: qty 1

## 2023-04-06 MED ORDER — CHLORHEXIDINE GLUCONATE 4 % EX SOLN: 60.0000 mL | Freq: Once | CUTANEOUS | Status: DC

## 2023-04-06 MED ORDER — HEPARIN SODIUM (PORCINE) 1000 UNIT/ML DIALYSIS
2000.0000 [IU] | Freq: Once | INTRAMUSCULAR | Status: DC
  Filled 2023-04-06: qty 2

## 2023-04-06 MED ORDER — LIDOCAINE HCL (PF) 1 % IJ SOLN
5.0000 mL | INTRAMUSCULAR | Status: DC | PRN
  Filled 2023-04-06: qty 5

## 2023-04-06 MED ORDER — LIDOCAINE-PRILOCAINE 2.5-2.5 % EX CREA: 1.0000 | TOPICAL_CREAM | CUTANEOUS | Status: DC | PRN

## 2023-04-06 MED ORDER — ONDANSETRON HCL 4 MG/2ML IJ SOLN
4.0000 mg | Freq: Four times a day (QID) | INTRAMUSCULAR | Status: DC | PRN
  Administered 2023-04-07: 4 mg via INTRAVENOUS
  Filled 2023-04-06: qty 2

## 2023-04-06 MED ORDER — ZOLPIDEM TARTRATE 5 MG PO TABS: 5.0000 mg | ORAL_TABLET | Freq: Every evening | ORAL | Status: DC | PRN

## 2023-04-06 MED ORDER — HYDRALAZINE HCL 20 MG/ML IJ SOLN: 5.0000 mg | INTRAMUSCULAR | Status: DC | PRN

## 2023-04-06 MED ORDER — SODIUM CHLORIDE 0.9 % IV SOLN: INTRAVENOUS | Status: DC

## 2023-04-06 MED ORDER — CEFAZOLIN IN SODIUM CHLORIDE 3-0.9 GM/100ML-% IV SOLN
3.0000 g | INTRAVENOUS | Status: DC
  Filled 2023-04-06: qty 100

## 2023-04-06 MED ORDER — CLOPIDOGREL BISULFATE 75 MG PO TABS
75.0000 mg | ORAL_TABLET | Freq: Every day | ORAL | Status: DC
  Administered 2023-04-08: 75 mg via ORAL
  Filled 2023-04-06: qty 1

## 2023-04-06 MED ORDER — INSULIN REGULAR HUMAN (CONC) 500 UNIT/ML ~~LOC~~ SOPN
70.0000 [IU] | PEN_INJECTOR | Freq: Two times a day (BID) | SUBCUTANEOUS | Status: DC
  Administered 2023-04-06 – 2023-04-08 (×3): 70 [IU] via SUBCUTANEOUS
  Filled 2023-04-06: qty 3

## 2023-04-06 MED ORDER — ALTEPLASE 2 MG IJ SOLR: 2.0000 mg | Freq: Once | INTRAMUSCULAR | Status: DC | PRN

## 2023-04-06 MED ORDER — PENTAFLUOROPROP-TETRAFLUOROETH EX AERO: 1.0000 | INHALATION_SPRAY | CUTANEOUS | Status: DC | PRN

## 2023-04-06 MED ORDER — INSULIN ASPART 100 UNIT/ML IJ SOLN
0.0000 [IU] | Freq: Every day | INTRAMUSCULAR | Status: DC
  Administered 2023-04-06: 4 [IU] via SUBCUTANEOUS

## 2023-04-06 MED ORDER — CAMPHOR-MENTHOL 0.5-0.5 % EX LOTN: 1.0000 | TOPICAL_LOTION | Freq: Three times a day (TID) | CUTANEOUS | Status: DC | PRN

## 2023-04-06 MED ORDER — NEPRO/CARBSTEADY PO LIQD: 237.0000 mL | Freq: Three times a day (TID) | ORAL | Status: DC | PRN

## 2023-04-06 MED ORDER — CALCIUM ACETATE (PHOS BINDER) 667 MG PO CAPS
1334.0000 mg | ORAL_CAPSULE | Freq: Three times a day (TID) | ORAL | Status: DC
  Administered 2023-04-06 – 2023-04-08 (×5): 1334 mg via ORAL
  Filled 2023-04-06 (×5): qty 2

## 2023-04-06 MED ORDER — ASPIRIN 81 MG PO TBEC
81.0000 mg | DELAYED_RELEASE_TABLET | Freq: Every morning | ORAL | Status: DC
  Administered 2023-04-08: 81 mg via ORAL
  Filled 2023-04-06 (×2): qty 1

## 2023-04-06 MED ORDER — TRAZODONE HCL 150 MG PO TABS
150.0000 mg | ORAL_TABLET | Freq: Every day | ORAL | Status: DC
  Administered 2023-04-06 – 2023-04-07 (×2): 150 mg via ORAL
  Filled 2023-04-06 (×2): qty 1

## 2023-04-06 MED ORDER — INSULIN ASPART 100 UNIT/ML IJ SOLN
0.0000 [IU] | Freq: Three times a day (TID) | INTRAMUSCULAR | Status: DC
  Administered 2023-04-06: 4 [IU] via SUBCUTANEOUS
  Administered 2023-04-06: 3 [IU] via SUBCUTANEOUS
  Administered 2023-04-07: 5 [IU] via SUBCUTANEOUS
  Administered 2023-04-08: 6 [IU] via SUBCUTANEOUS

## 2023-04-06 MED ORDER — INSULIN ASPART 100 UNIT/ML IJ SOLN
10.0000 [IU] | INTRAMUSCULAR | Status: DC
  Administered 2023-04-06: 10 [IU] via SUBCUTANEOUS

## 2023-04-06 MED ORDER — INSULIN ASPART 100 UNIT/ML IJ SOLN: 12.0000 [IU] | INTRAMUSCULAR | Status: AC

## 2023-04-06 MED ORDER — HYDROXYZINE HCL 25 MG PO TABS: 25.0000 mg | ORAL_TABLET | Freq: Three times a day (TID) | ORAL | Status: DC | PRN

## 2023-04-06 MED ORDER — CHLORHEXIDINE GLUCONATE 0.12 % MT SOLN
OROMUCOSAL | Status: AC
  Administered 2023-04-07: 15 mL via OROMUCOSAL
  Filled 2023-04-06: qty 15

## 2023-04-06 MED ORDER — ONDANSETRON HCL 4 MG PO TABS: 4.0000 mg | ORAL_TABLET | Freq: Four times a day (QID) | ORAL | Status: DC | PRN

## 2023-04-06 MED ORDER — INSULIN ASPART 100 UNIT/ML IJ SOLN
10.0000 [IU] | Freq: Once | INTRAMUSCULAR | Status: AC
  Administered 2023-04-06: 10 [IU] via SUBCUTANEOUS

## 2023-04-06 MED ORDER — CARVEDILOL 12.5 MG PO TABS
12.5000 mg | ORAL_TABLET | Freq: Two times a day (BID) | ORAL | Status: DC
  Administered 2023-04-06 – 2023-04-08 (×4): 12.5 mg via ORAL
  Filled 2023-04-06 (×4): qty 1

## 2023-04-06 MED ORDER — ATORVASTATIN CALCIUM 40 MG PO TABS
40.0000 mg | ORAL_TABLET | Freq: Every morning | ORAL | Status: DC
  Administered 2023-04-08: 40 mg via ORAL
  Filled 2023-04-06 (×2): qty 1

## 2023-04-06 MED ORDER — INSULIN GLARGINE-YFGN 100 UNIT/ML ~~LOC~~ SOLN
50.0000 [IU] | Freq: Once | SUBCUTANEOUS | Status: AC
  Administered 2023-04-06: 50 [IU] via SUBCUTANEOUS
  Filled 2023-04-06: qty 0.5

## 2023-04-06 MED ORDER — DOCUSATE SODIUM 283 MG RE ENEM: 1.0000 | ENEMA | RECTAL | Status: DC | PRN

## 2023-04-06 MED ORDER — SERTRALINE HCL 100 MG PO TABS
100.0000 mg | ORAL_TABLET | Freq: Every morning | ORAL | Status: DC
  Administered 2023-04-08: 100 mg via ORAL
  Filled 2023-04-06: qty 1

## 2023-04-06 MED ORDER — NORTRIPTYLINE HCL 10 MG PO CAPS
20.0000 mg | ORAL_CAPSULE | Freq: Every day | ORAL | Status: DC
  Administered 2023-04-06 – 2023-04-07 (×2): 20 mg via ORAL
  Filled 2023-04-06 (×3): qty 2

## 2023-04-06 MED ORDER — ACETAMINOPHEN 325 MG PO TABS: 650.0000 mg | ORAL_TABLET | Freq: Four times a day (QID) | ORAL | Status: DC | PRN

## 2023-04-06 MED ORDER — HEPARIN SODIUM (PORCINE) 5000 UNIT/ML IJ SOLN
5000.0000 [IU] | Freq: Three times a day (TID) | INTRAMUSCULAR | Status: DC
  Administered 2023-04-06 – 2023-04-08 (×4): 5000 [IU] via SUBCUTANEOUS
  Filled 2023-04-06 (×4): qty 1

## 2023-04-06 MED ORDER — CHLORHEXIDINE GLUCONATE 0.12 % MT SOLN: 15.0000 mL | Freq: Once | OROMUCOSAL | Status: DC

## 2023-04-06 MED ORDER — INSULIN ASPART 100 UNIT/ML IJ SOLN
INTRAMUSCULAR | Status: AC
  Administered 2023-04-06: 12 [IU] via SUBCUTANEOUS
  Filled 2023-04-06: qty 1

## 2023-04-06 MED ORDER — HEPARIN SODIUM (PORCINE) 1000 UNIT/ML IJ SOLN
INTRAMUSCULAR | Status: AC
  Filled 2023-04-06: qty 2

## 2023-04-06 MED ORDER — ALTEPLASE 2 MG IJ SOLR
INTRAMUSCULAR | Status: AC
  Administered 2023-04-06: 2 mg
  Filled 2023-04-06: qty 4

## 2023-04-06 MED ORDER — PREGABALIN 75 MG PO CAPS
75.0000 mg | ORAL_CAPSULE | Freq: Every day | ORAL | Status: DC
  Administered 2023-04-06 – 2023-04-07 (×2): 75 mg via ORAL
  Filled 2023-04-06 (×2): qty 1

## 2023-04-06 MED ORDER — ORAL CARE MOUTH RINSE: 15.0000 mL | Freq: Once | OROMUCOSAL | Status: DC

## 2023-04-06 MED ORDER — DIAZEPAM 5 MG PO TABS: 5.0000 mg | ORAL_TABLET | Freq: Two times a day (BID) | ORAL | Status: DC | PRN

## 2023-04-06 MED ORDER — HEPARIN SODIUM (PORCINE) 1000 UNIT/ML DIALYSIS
1000.0000 [IU] | INTRAMUSCULAR | Status: DC | PRN
  Filled 2023-04-06 (×2): qty 1

## 2023-04-06 MED ORDER — CALCIUM CARBONATE ANTACID 1250 MG/5ML PO SUSP: 500.0000 mg | Freq: Four times a day (QID) | ORAL | Status: DC | PRN

## 2023-04-06 MED ORDER — TORSEMIDE 20 MG PO TABS
120.0000 mg | ORAL_TABLET | Freq: Two times a day (BID) | ORAL | Status: DC
  Administered 2023-04-06 – 2023-04-08 (×3): 120 mg via ORAL
  Filled 2023-04-06: qty 1
  Filled 2023-04-06: qty 6
  Filled 2023-04-06: qty 1
  Filled 2023-04-06: qty 6

## 2023-04-06 MED ORDER — HEPARIN SODIUM (PORCINE) 1000 UNIT/ML IJ SOLN
INTRAMUSCULAR | Status: AC
  Filled 2023-04-06: qty 4

## 2023-04-06 MED ORDER — CHLORHEXIDINE GLUCONATE CLOTH 2 % EX PADS
6.0000 | MEDICATED_PAD | Freq: Every day | CUTANEOUS | Status: DC
  Administered 2023-04-07: 6 via TOPICAL

## 2023-04-06 NOTE — Progress Notes (Signed)
Patient was educated on the 70 units of Humulin R but gave himself 140 stated "this what I take at home". Witness by [facility] LPN.

## 2023-04-06 NOTE — Progress Notes (Signed)
Pt refused CPAP for night time use. 

## 2023-04-06 NOTE — Progress Notes (Signed)
CBG 578, istat obtained per protocol. Cbg 576. Anesthesia made aware and ordered Novolog 12 units SQ. Orders received and carried out.

## 2023-04-06 NOTE — Assessment & Plan Note (Signed)
Continue tamsulosin.

## 2023-04-06 NOTE — H&P (View-Only) (Signed)
[facility] Renal Consultation Note    Indication for Consultation:  Management of ESRD/hemodialysis, anemia, hypertension/volume, and secondary hyperparathyroidism.  HPI: Jeffrey [name] is a 62 y.o. male with PMH including ESRD on dialysis, CHF, COPD, PTSH, sleep apnea, T2DM who presented today for AVF vs AVG and TDC exchange. Unfortunately, patient's blood sugar was too high and procedure was cancelled. Current plan is admission overnight and surgery once blood sugar is under control. Nephrology has been consulted for management of ESRD. Pt was discharged from his outpatient dialysis unit for behavioral issues and has not been accepted at a new unit at this time, so he is receiving dialysis through the ED. Last dialysis was 04/04/23, have been having issues with his catheter for the past week. Labs notable for NA 132, K+ 4.1, BUN 96, Cr 6.3, Hgb 9.2, glucose 576. BP 168/68, VS otherwise stable.   Patient reports he is hungry today, otherwise with no new concerns. Denies SOB, orthopnea, CP, palpitations, abdominal pain, nausea, vomiting, diarrhea, fatigue, weakness, fever, chills.   Past Medical History:  Diagnosis Date   Altered mental status 05/04/2021   Arthritis    Blood transfusion without reported diagnosis    CHF (congestive heart failure) (HCC)    COPD (chronic obstructive pulmonary disease) (HCC)    Depression    Diabetes mellitus without complication (HCC)    type 2   Dyspnea    ESRD on hemodialysis (HCC)    Tues Thurs Sat   GERD (gastroesophageal reflux disease)    protonix   Heart murmur    as a child, no problems   Hypertension    Myocardial infarction (HCC)    PTSD (post-traumatic stress disorder)    Sepsis (HCC)    Sleep apnea    occasional uses CPAP   Uses powered wheelchair    Past Surgical History:  Procedure Laterality Date   A/V FISTULAGRAM N/A 08/30/2021   Procedure: A/V FISTULAGRAM;  Surgeon: Cain, Brandon Christopher, MD;  Location: [facility];  Service: Cardiovascular;  Laterality: N/A;   A/V FISTULAGRAM Right 07/28/2022   Procedure: A/V Fistulagram;  Surgeon: Cain, Brandon Christopher, MD;  Location: [facility];  Service: Cardiovascular;  Laterality: Right;   A/V FISTULAGRAM Right 09/05/2022   Procedure: A/V Fistulagram;  Surgeon: Cain, Brandon Christopher, MD;  Location: [facility];  Service: Cardiovascular;  Laterality: Right;   A/V FISTULAGRAM Right 10/31/2022   Procedure: A/V Fistulagram;  Surgeon: Cain, Brandon Christopher, MD;  [facility]: [facility];  Service: Cardiovascular;  Laterality: Right;   A/V SHUNT INTERVENTION Right 10/31/2022   Procedure: A/V SHUNT INTERVENTION;  Surgeon: Cain, Brandon Christopher, MD;  [facility]: [facility];  Service: Cardiovascular;  Laterality: Right;   A/V SHUNTOGRAM N/A 11/08/2021   Procedure: A/V SHUNTOGRAM;  Surgeon: Cain, Brandon Christopher, MD;  [facility]: [facility];  Service: Cardiovascular;  Laterality: N/A;   AMPUTATION Left 04/03/2021   Procedure: LEFT ABOVE-THE-KNEE AMPUTATION;  Surgeon: Adair, Christopher R, MD;  Location: [facility];  Service: Orthopedics;  Laterality: Left;   AV FISTULA PLACEMENT Right 08/12/2021   Procedure: INSERTION OF ARTERIOVENOUS (AV) GORE-TEX GRAFT RIGHT ARM;  Surgeon: Brabham, Vance W, MD;  Location: [facility];  Service: Vascular;  Laterality: Right;   INSERTION OF DIALYSIS CATHETER N/A 11/25/2022   Procedure: INSERTION OF DIALYSIS CATHETER;  Surgeon: Smith, Daniel C, MD;  Location: [facility];  Service: Pulmonary;  Laterality: N/A;   INSERTION OF DIALYSIS CATHETER N/A 03/03/2023     Procedure: INSERTION OF TUNNELED DIALYSIS CATHETER;  Surgeon: Cain, Brandon Christopher, MD;  Location: [facility];  Service: Vascular;  Laterality: N/A;   IR REMOVAL TUN CV CATH W/O FL  03/30/2021   JOINT REPLACEMENT     Bilateral knees   LIGATION ARTERIOVENOUS GORTEX GRAFT Right 03/03/2023   Procedure: LIGATION RIGHT ARM ARTERIOVENOUS GORTEX GRAFT;   Surgeon: Cain, Brandon Christopher, MD;  Location: [facility];  Service: Vascular;  Laterality: Right;   PERIPHERAL VASCULAR BALLOON ANGIOPLASTY Right 08/30/2021   Procedure: PERIPHERAL VASCULAR BALLOON ANGIOPLASTY;  Surgeon: Cain, Brandon Christopher, MD;  [facility];  Service: Cardiovascular;  Laterality: Right;   PERIPHERAL VASCULAR BALLOON ANGIOPLASTY Right 07/28/2022   Procedure: PERIPHERAL VASCULAR BALLOON ANGIOPLASTY;  Surgeon: Cain, Brandon Christopher, MD;  [facility];  Service: Cardiovascular;  Laterality: Right;  AVF   PERIPHERAL VASCULAR INTERVENTION  11/08/2021   Procedure: PERIPHERAL VASCULAR INTERVENTION;  Surgeon: Cain, Brandon Christopher, MD;  [facility]: [facility];  Service: Cardiovascular;;  Central rt arm fistula   UPPER EXTREMITY VENOGRAPHY Right 08/10/2021   Procedure: UPPER EXTREMITY VENOGRAPHY;  Surgeon: Brabham, Vance W, MD;  [facility]: [facility];  Service: Cardiovascular;  Laterality: Right;   History reviewed. No pertinent family history. Social History:  reports that he quit smoking about 12 years ago. His smoking use included cigarettes. He has never used smokeless tobacco. He reports current alcohol use. He reports that he does not currently use drugs.  ROS: As per HPI otherwise negative.   Physical Exam: Vitals:   04/05/23 1221 04/06/23 0647  BP:  (!) 168/68  Pulse:  100  Resp:  18  Temp:  98.1 F (36.7 C)  TempSrc:  Oral  SpO2:  96%  Weight: 133 kg 133 kg  Height: 5' 5" (1.651 m) 5' 5" (1.651 m)     General: Alert male in NAD Head: Normocephalic, atraumatic, sclera non-icteric, mucus membranes are moist. Lungs: Clear bilaterally to auscultation without wheezes, rales, or rhonchi. Breathing is unlabored. On O2 via [place] Heart: RRR with normal S1, S2. No murmurs, rubs, or gallops appreciated. Abdomen: Soft, non-tender, non-distended with normoactive bowel sounds.  Musculoskeletal:  Strength and tone appear  normal for age. Lower extremities: 1+ pitting edema b/l lower extremities Neuro: Alert and oriented X 3. Moves all extremities spontaneously. Psych:  Responds to questions appropriately with a normal affect. Dialysis Access: TDC  Allergies  Allergen Reactions   Actos [Pioglitazone] Anaphylaxis and Rash   Dexmedetomidine Nausea And Vomiting    (Precedex) Dose-limiting bradycardia     Ibuprofen Shortness Of Breath and Swelling    Pt tolerates aspirin    Tomato Anaphylaxis    Only allergic to RAW tomatoes   Wellbutrin [Bupropion] Swelling   Prior to Admission medications   Medication Sig Start Date End Date Taking? Authorizing Provider  amLODipine (NORVASC) 10 MG tablet Take 10 mg by mouth every morning. 04/17/20  Yes [provider]  ASPIRIN LOW DOSE 81 MG EC tablet Take 81 mg by mouth in the morning. 02/22/21  Yes [provider]  atorvastatin (LIPITOR) 40 MG tablet Take 40 mg by mouth in the morning. 02/09/21  Yes [provider]  calcium acetate (PHOSLO) 667 MG capsule Take 2 capsules (1,334 mg total) by mouth with breakfast, with lunch, and with evening meal. 04/10/21  Yes Rizwan, Saima, MD  carvedilol (COREG) 12.5 MG tablet Take 12.5 mg by mouth 2 (two) times daily with a meal. 04/17/20  Yes [provider]  clopidogrel (PLAVIX) 75 MG tablet Take 1 tablet (75 mg total) by mouth daily. 11/02/22  Yes Rhyne, Dylanie Quesenberry J, PA-C  cyclobenzaprine (FLEXERIL) 10 MG tablet Take 10 mg by mouth 3 (three) times daily as needed for muscle spasms. 12/29/21  Yes [provider]  diazepam (VALIUM) 5 MG tablet Take 5 mg by mouth 2 (two) times daily as needed for muscle spasms. 12/09/21  Yes [provider]  insulin regular human CONCENTRATED (HUMULIN R U-500 KWIKPEN) 500 UNIT/ML KwikPen Inject 45 Units into the skin daily with supper. Patient taking differently: Inject 125 Units into the skin 2 (two) times daily with a meal. 09/04/21 08/30/24 Yes Ogbata,  Sylvester I, MD  metolazone (ZAROXOLYN) 10 MG tablet Take 10 mg by mouth daily. 07/13/21  Yes [provider]  nortriptyline (PAMELOR) 10 MG capsule Take 20 mg by mouth at bedtime. 10/23/19 08/30/24 Yes [provider]  pantoprazole (PROTONIX) 40 MG tablet Take 40 mg by mouth daily before breakfast. 02/09/21  Yes [provider]  pregabalin (LYRICA) 75 MG capsule Take 1 capsule (75 mg total) by mouth See admin instructions. Daily.  Give after dialysis on dialysis days. 01/21/23  Yes Knapp, Jon, MD  sertraline (ZOLOFT) 100 MG tablet Take 100 mg by mouth in the morning. 02/22/21  Yes [provider]  tamsulosin (FLOMAX) 0.4 MG CAPS capsule Take 1 capsule (0.4 mg total) by mouth daily. 11/10/21  Yes Pahwani, Ravi, MD  torsemide (DEMADEX) 20 MG tablet Take 120 mg by mouth 2 (two) times daily.   Yes [provider]  Cholecalciferol (VITAMIN D) 50 MCG (2000 UT) CAPS Take 2,000 Units by mouth daily.    [provider]  oxyCODONE (ROXICODONE) 5 MG immediate release tablet Take 1 tablet (5 mg total) by mouth every 8 (eight) hours as needed. Patient taking differently: Take 5 mg by mouth every 8 (eight) hours as needed for moderate pain. 12/31/22 12/31/23  [place], Eric J, DO  traZODone (DESYREL) 150 MG tablet Take 150 mg by mouth at bedtime. 12/09/21   [provider]   Current Facility-Administered Medications  Medication Dose Route Frequency Provider Last Rate Last Admin   acetaminophen (TYLENOL) tablet 650 mg  650 mg Oral Q6H PRN Yates, Jennifer, MD       Or   acetaminophen (TYLENOL) suppository 650 mg  650 mg Rectal Q6H PRN Yates, Jennifer, MD       amLODipine (NORVASC) tablet 10 mg  10 mg Oral q morning Yates, Jennifer, MD       aspirin EC tablet 81 mg  81 mg Oral q AM Yates, Jennifer, MD       atorvastatin (LIPITOR) tablet 40 mg  40 mg Oral q AM Yates, Jennifer, MD       calcium acetate (PHOSLO) capsule 1,334 mg  1,334 mg Oral TID with meals  Yates, Jennifer, MD       calcium carbonate (dosed in mg elemental calcium) suspension 500 mg of elemental calcium  500 mg of elemental calcium Oral Q6H PRN Yates, Jennifer, MD       camphor-menthol (SARNA) lotion 1 Application  1 Application Topical Q8H PRN Yates, Jennifer, MD       And   hydrOXYzine (ATARAX) tablet 25 mg  25 mg Oral Q8H PRN Yates, Jennifer, MD       carvedilol (COREG) tablet 12.5 mg  12.5 mg Oral BID WC Yates, Jennifer, MD       chlorhexidine (PERIDEX) 0.12 % solution              clopidogrel (PLAVIX) tablet 75 mg  75 mg Oral Daily Yates, Jennifer, MD       cyclobenzaprine (FLEXERIL) tablet 10 mg  10 mg Oral TID PRN Yates, Jennifer, MD       diazepam (VALIUM) tablet 5 mg  5 mg Oral BID PRN Yates, Jennifer, MD       docusate sodium (ENEMEEZ) enema 283 mg  1 enema Rectal PRN Yates, Jennifer, MD       feeding supplement (NEPRO CARB STEADY) liquid 237 mL  237 mL Oral TID PRN Yates, Jennifer, MD       heparin injection 5,000 Units  5,000 Units Subcutaneous Q8H Yates, Jennifer, MD       hydrALAZINE (APRESOLINE) injection 5 mg  5 mg Intravenous Q4H PRN Yates, Jennifer, MD       insulin aspart (novoLOG) injection 0-5 Units  0-5 Units Subcutaneous QHS Yates, Jennifer, MD       insulin aspart (novoLOG) injection 0-6 Units  0-6 Units Subcutaneous TID WC Yates, Jennifer, MD       insulin regular human CONCENTRATED (HUMULIN R) 500 UNIT/ML KwikPen 125 Units  125 Units Subcutaneous BID WC Yates, Jennifer, MD       metolazone (ZAROXOLYN) tablet 10 mg  10 mg Oral Daily Yates, Jennifer, MD       nortriptyline (PAMELOR) capsule 20 mg  20 mg Oral QHS Yates, Jennifer, MD       ondansetron (ZOFRAN) tablet 4 mg  4 mg Oral Q6H PRN Yates, Jennifer, MD       Or   ondansetron (ZOFRAN) injection 4 mg  4 mg Intravenous Q6H PRN Yates, Jennifer, MD       [START ON 04/07/2023] pantoprazole (PROTONIX) EC tablet 40 mg  40 mg Oral QAC breakfast Yates, Jennifer, MD       pregabalin (LYRICA) capsule 75 mg  75 mg  Oral See admin instructions Yates, Jennifer, MD       sertraline (ZOLOFT) tablet 100 mg  100 mg Oral q AM Yates, Jennifer, MD       sorbitol 70 % solution 30 mL  30 mL Oral PRN Yates, Jennifer, MD       tamsulosin (FLOMAX) capsule 0.4 mg  0.4 mg Oral Daily Yates, Jennifer, MD       torsemide (DEMADEX) tablet 120 mg  120 mg Oral BID Yates, Jennifer, MD       traZODone (DESYREL) tablet 150 mg  150 mg Oral QHS Yates, Jennifer, MD       zolpidem (AMBIEN) tablet 5 mg  5 mg Oral QHS PRN Yates, Jennifer, MD       Labs: Basic Metabolic Panel: Recent Labs  Lab 03/30/23 1248 04/04/23 1140 04/06/23 0713  NA 133* 134* 132*  K 4.0 3.7 4.1  CL 94* 88* 91*  CO2 30 29  --   GLUCOSE 316* 263* 576*  BUN 73* 110* 96*  CREATININE 5.47* 6.41* 6.30*  CALCIUM 8.6* 7.8*  --   PHOS 4.5 5.7*  --    Liver Function Tests: Recent Labs  Lab 03/30/23 1248 04/04/23 1140  ALBUMIN 3.6 3.6   No results for input(s): "LIPASE", "AMYLASE" in the last 168 hours. No results for input(s): "AMMONIA" in the last 168 hours. CBC: Recent Labs  Lab 03/30/23 1248 04/04/23 1154 04/06/23 0713  WBC 5.3 6.5  --   HGB 8.0* 8.6* 9.2*  HCT 26.1* 26.8* 27.0*  MCV 91.3 89.3  --   PLT 96* 130*  --       CBG: Recent Labs  Lab 04/06/23 0650 04/06/23 0830 04/06/23 0932  GLUCAP 578* 553* 488*     Dialysis Orders: Last OP HD orders (dec 2023):  3:15h  129kg  RUE AVG   Heparin 2000  Currently no outpatient HD center  Assessment/Plan:  Hyperglycemia: Presented for surgery with glucose >500. He is asymptomatic. Insulin per admitting team Dialysis access issue: Has been having issues with TDC  over the past 1-2 weeks, was scheduled for permanent access and TDC exchange but surgery was cancelled due to hyperglycemia. Surgery rescheduled for tomorrow.  ESRD:  On TTS schedule, was discharged from his outpatient dialysis unit due to behavioral issues and unfortunately has not been accepted at another unit yet, so receiving  dialysis through the ED. HD today, see above.  Hypertension/volume: BP elevated and has edema on exam. UFG 3L today as tolerated.   Anemia: Hgb 9.2- last received aranesp 60mcg on 03/30/23, will order a dose for today.   Metabolic bone disease: Will check RFP with HD, not on VDRA. Continue calcium acetate.   Nutrition:  Currently NPO  Ariana Cavenaugh, PA-C 04/06/2023, 11:55 AM  [facility] Pager: [phone]  

## 2023-04-06 NOTE — Assessment & Plan Note (Signed)
-  Patient with apparent behavioral issues leading to dismissal from HD center(s) -Continue home meds - Valium, nortriptyline, sertraline, trazodone

## 2023-04-06 NOTE — Assessment & Plan Note (Addendum)
-  Continue ASA, Plavix, Lipitor for PVD -Stump is well healed and patient reports no concerns about RLE at this time

## 2023-04-06 NOTE — Assessment & Plan Note (Signed)
Continue Protonix °

## 2023-04-06 NOTE — Assessment & Plan Note (Signed)
-  I have reviewed this patient in the [place] Controlled Substances Reporting System.  He is usually receiving medications from only one provider and appears to be taking them as prescribed. -He is not at particularly high risk of opioid misuse, diversion, or overdose.  -Continue flexeril and pregabalin -He is NOT receiving chronic opiates and these have not been prescribed for acute pain since there is no current indication

## 2023-04-06 NOTE — Assessment & Plan Note (Signed)
-  Continue amlodipine, carvedilol

## 2023-04-06 NOTE — Assessment & Plan Note (Signed)
-  Patient with long-standing DM on huge doses of insulin at baseline (reports 70-125 units BID of U500) -Despite this, very poor control with last A1c 10.5 -Missed last night's and this AM's dose -He was given a (relative) tiny dose of insulin without improvement -He was then given 50 units of glargine -Will resume home U500, currently at 70 units BID (this medication should not be used on a sliding scale) -Cover with very sensitive-scale SSI  -This should not preclude him from having surgery unless he goes into DKA

## 2023-04-06 NOTE — Progress Notes (Signed)
Attempted to do dialysis on patient, unsuccessful due to poor catheter function, pt was not able to start treatment

## 2023-04-06 NOTE — Progress Notes (Signed)
Anesthesia ordered Novolog 10 units SQ. MAR not available to chart med given. Gave Novolog 10 units SQ at this time.

## 2023-04-06 NOTE — Assessment & Plan Note (Signed)
-  Body mass index is 48.79 kg/m..  -Weight loss should be encouraged -Outpatient PCP/bariatric medicine f/u encouraged

## 2023-04-06 NOTE — Assessment & Plan Note (Signed)
Continue CPAP.  

## 2023-04-06 NOTE — Consult Note (Addendum)
Initial Consultation Note - this will serve as the H&P for this hospitalization   Patient: Jeffrey [name] ZOX:[id number] DOB: Jan 02, 1961 PCP: Knox Royalty, MD DOA: 04/06/2023 DOS: the patient was seen and examined on 04/06/2023 Primary service: Jonah Blue, MD  Referring physician: Randie Heinz Reason for consult: Requesting admission.  L arm AV fistula.  Glucose 553.  Anesthesia requested to cancel the case.  He dialyzes through the ER since he has been discharged from all area HD centers due to behavior.     Assessment and Plan: * Hyperglycemia due to diabetes mellitus (HCC) -Patient with long-standing DM on huge doses of insulin at baseline (reports 70-125 units BID of U500) -Despite this, very poor control with last A1c 10.5 -Missed last night's and this AM's dose -He was given a (relative) tiny dose of insulin without improvement -He was then given 50 units of glargine -Will resume home U500, currently at 70 units BID (this medication should not be used on a sliding scale) -Cover with very sensitive-scale SSI  -This should not preclude him from having surgery unless he goes into DKA  ESRD on dialysis [facility]) -Patient on chronic TTS HD - but has to get this through the ER reportedly -Would consider a behavior contract for him as an outpatient as a condition for ongoing HD, as the plan for serial HD through the ER is not sustainable -Nephrology prn order set utilized -Nephrology is aware that patient will need HD prior to dc -Continue Phoslo -Most recent echo showed normal systolic and diastolic function but he is on torsemide and daily metolazone; consider the necessity of these medications, as less meds may improve his compliance -He will need AV fistula/graft and/or TDC tomorrow prior to dc  Accelerated hypertension -Continue amlodipine, carvedilol  Chronic pain -I have reviewed this patient in the [place] Controlled Substances Reporting System.  He is usually receiving medications from only  one provider and appears to be taking them as prescribed. -He is not at particularly high risk of opioid misuse, diversion, or overdose.  -Continue flexeril and pregabalin -He is NOT receiving chronic opiates and these have not been prescribed for acute pain since there is no current indication   Benign prostatic hyperplasia with weak urinary stream -Continue tamsulosin  S/P AKA (above knee amputation), left (HCC) -Continue ASA, Plavix, Lipitor for PVD -Stump is well healed and patient reports no concerns about RLE at this time  OSA on CPAP -Continue CPAP  Gastroesophageal reflux disease without esophagitis -Continue Protonix  Class 3 severe obesity due to excess calories with serious comorbidity and body mass index (BMI) of 45.0 to 49.9 in adult (HCC) -Body mass index is 48.79 kg/m..  -Weight loss should be encouraged -Outpatient PCP/bariatric medicine f/u encouraged   Mood disorder [facility]) -Patient with apparent behavioral issues leading to dismissal from HD center(s) -Continue home meds - Valium, nortriptyline, sertraline, trazodone    TRH will observe the patient overnight in an attempt to improve the hyperglycemia so that he can have HD access surgery tomorrow and then HD prior to dc.   HPI: Jeffrey [name] is a 62 y.o. male with past medical history of COPD, DM, ESRD on TTS HD, HTN, PTSD, CAD, and OSA occasionally on CPAP presenting for fistula today.  He has been released from the local HD centers due to behavioral concerns and is now presenting to the ER for his routine HD.  He was due for AV graft creation but "forgot" his nighttime U500 (70-125 units BID on  a sliding scale, but patient report) dose and also didn't have an AM dose.  When he got to short stay, his glucose was >500 and when it didn't improve with 22 units of short-acting insulin the surgery was postponed until tomorrow.  I queried about whether the procedure could be done today anyway with further glucose  improvement after an appropriate dose of insulin since he wasn't in DKA and this was denied.  Patient has no other complaints at this time (other than being unhappy with the renal/carb modified diet).   Review of Systems: As mentioned in the history of present illness. All other systems reviewed and are negative. Past Medical History:  Diagnosis Date   Altered mental status 05/04/2021   Arthritis    Blood transfusion without reported diagnosis    CHF (congestive heart failure) (HCC)    COPD (chronic obstructive pulmonary disease) (HCC)    Depression    Diabetes mellitus without complication (HCC)    type 2   Dyspnea    ESRD on hemodialysis (HCC)    Tues Thurs Sat   GERD (gastroesophageal reflux disease)    protonix   Heart murmur    as a child, no problems   Hypertension    Myocardial infarction (HCC)    PTSD (post-traumatic stress disorder)    Sepsis (HCC)    Sleep apnea    occasional uses CPAP   Uses powered wheelchair    Past Surgical History:  Procedure Laterality Date   A/V FISTULAGRAM N/A 08/30/2021   Procedure: A/V FISTULAGRAM;  Surgeon: Maeola Harman, MD;  Location: [facility] INVASIVE CV LAB;  Service: Cardiovascular;  Laterality: N/A;   A/V FISTULAGRAM Right 07/28/2022   Procedure: A/V Fistulagram;  Surgeon: Maeola Harman, MD;  Location: [facility] INVASIVE CV LAB;  Service: Cardiovascular;  Laterality: Right;   A/V FISTULAGRAM Right 09/05/2022   Procedure: A/V Fistulagram;  Surgeon: Maeola Harman, MD;  Location: [facility] INVASIVE CV LAB;  Service: Cardiovascular;  Laterality: Right;   A/V FISTULAGRAM Right 10/31/2022   Procedure: A/V Fistulagram;  Surgeon: Maeola Harman, MD;  Location: [facility] INVASIVE CV LAB;  Service: Cardiovascular;  Laterality: Right;   A/V SHUNT INTERVENTION Right 10/31/2022   Procedure: A/V SHUNT INTERVENTION;  Surgeon: Maeola Harman, MD;  Location: [facility] INVASIVE CV LAB;  Service: Cardiovascular;  Laterality: Right;    A/V SHUNTOGRAM N/A 11/08/2021   Procedure: A/V SHUNTOGRAM;  Surgeon: Maeola Harman, MD;  Location: [facility] INVASIVE CV LAB;  Service: Cardiovascular;  Laterality: N/A;   AMPUTATION Left 04/03/2021   Procedure: LEFT ABOVE-THE-KNEE AMPUTATION;  Surgeon: Terance Hart, MD;  Location: [facility] OR;  Service: Orthopedics;  Laterality: Left;   AV FISTULA PLACEMENT Right 08/12/2021   Procedure: INSERTION OF ARTERIOVENOUS (AV) GORE-TEX GRAFT RIGHT ARM;  Surgeon: Nada Libman, MD;  Location: [facility] OR;  Service: Vascular;  Laterality: Right;   INSERTION OF DIALYSIS CATHETER N/A 11/25/2022   Procedure: INSERTION OF DIALYSIS CATHETER;  Surgeon: Lorin Glass, MD;  Location: [facility] ENDOSCOPY;  Service: Pulmonary;  Laterality: N/A;   INSERTION OF DIALYSIS CATHETER N/A 03/03/2023   Procedure: INSERTION OF TUNNELED DIALYSIS CATHETER;  Surgeon: Maeola Harman, MD;  Location: [facility] OR;  Service: Vascular;  Laterality: N/A;   IR REMOVAL TUN CV CATH W/O FL  03/30/2021   JOINT REPLACEMENT     Bilateral knees   LIGATION ARTERIOVENOUS GORTEX GRAFT Right 03/03/2023   Procedure: LIGATION RIGHT ARM ARTERIOVENOUS GORTEX GRAFT;  Surgeon: Maeola Harman, MD;  Location: [facility] OR;  Service: Vascular;  Laterality: Right;   PERIPHERAL VASCULAR BALLOON ANGIOPLASTY Right 08/30/2021   Procedure: PERIPHERAL VASCULAR BALLOON ANGIOPLASTY;  Surgeon: Maeola Harman, MD;  Location: [facility] INVASIVE CV LAB;  Service: Cardiovascular;  Laterality: Right;   PERIPHERAL VASCULAR BALLOON ANGIOPLASTY Right 07/28/2022   Procedure: PERIPHERAL VASCULAR BALLOON ANGIOPLASTY;  Surgeon: Maeola Harman, MD;  Location: [facility] INVASIVE CV LAB;  Service: Cardiovascular;  Laterality: Right;  AVF   PERIPHERAL VASCULAR INTERVENTION  11/08/2021   Procedure: PERIPHERAL VASCULAR INTERVENTION;  Surgeon: Maeola Harman, MD;  Location: [facility] INVASIVE CV LAB;  Service: Cardiovascular;;  Central rt arm fistula   UPPER EXTREMITY  VENOGRAPHY Right 08/10/2021   Procedure: UPPER EXTREMITY VENOGRAPHY;  Surgeon: Nada Libman, MD;  Location: [facility] INVASIVE CV LAB;  Service: Cardiovascular;  Laterality: Right;   Social History:  reports that he quit smoking about 12 years ago. His smoking use included cigarettes. He has never used smokeless tobacco. He reports current alcohol use. He reports that he does not currently use drugs.  Allergies  Allergen Reactions   Actos [Pioglitazone] Anaphylaxis and Rash   Dexmedetomidine Nausea And Vomiting    (Precedex) Dose-limiting bradycardia     Ibuprofen Shortness Of Breath and Swelling    Pt tolerates aspirin    Tomato Anaphylaxis    Only allergic to RAW tomatoes   Wellbutrin [Bupropion] Swelling    History reviewed. No pertinent family history.  Prior to Admission medications   Medication Sig Start Date End Date Taking? Authorizing Provider  amLODipine (NORVASC) 10 MG tablet Take 10 mg by mouth every morning. 04/17/20  Yes [provider]  ASPIRIN LOW DOSE 81 MG EC tablet Take 81 mg by mouth in the morning. 02/22/21  Yes [provider]  atorvastatin (LIPITOR) 40 MG tablet Take 40 mg by mouth in the morning. 02/09/21  Yes [provider]  calcium acetate (PHOSLO) 667 MG capsule Take 2 capsules (1,334 mg total) by mouth with breakfast, with lunch, and with evening meal. 04/10/21  Yes Calvert Cantor, MD  carvedilol (COREG) 12.5 MG tablet Take 12.5 mg by mouth 2 (two) times daily with a meal. 04/17/20  Yes [provider]  clopidogrel (PLAVIX) 75 MG tablet Take 1 tablet (75 mg total) by mouth daily. 11/02/22  Yes Rhyne, Ames Coupe, PA-C  cyclobenzaprine (FLEXERIL) 10 MG tablet Take 10 mg by mouth 3 (three) times daily as needed for muscle spasms. 12/29/21  Yes [provider]  diazepam (VALIUM) 5 MG tablet Take 5 mg by mouth 2 (two) times daily as needed for muscle spasms. 12/09/21  Yes [provider]  insulin regular human  CONCENTRATED (HUMULIN R U-500 KWIKPEN) 500 UNIT/ML KwikPen Inject 45 Units into the skin daily with supper. Patient taking differently: Inject 125 Units into the skin 2 (two) times daily with a meal. 09/04/21 08/30/24 Yes Ogbata, Lafayette Dragon, MD  metolazone (ZAROXOLYN) 10 MG tablet Take 10 mg by mouth daily. 07/13/21  Yes [provider]  nortriptyline (PAMELOR) 10 MG capsule Take 20 mg by mouth at bedtime. 10/23/19 08/30/24 Yes [provider]  pantoprazole (PROTONIX) 40 MG tablet Take 40 mg by mouth daily before breakfast. 02/09/21  Yes [provider]  pregabalin (LYRICA) 75 MG capsule Take 1 capsule (75 mg total) by mouth See admin instructions. Daily.  Give after dialysis on dialysis days. 01/21/23  Yes [name], MD  sertraline (ZOLOFT) 100 MG tablet Take 100 mg by mouth in the morning. 02/22/21  Yes [provider]  tamsulosin (FLOMAX) 0.4 MG CAPS capsule Take 1 capsule (0.4 mg total) by mouth daily. 11/10/21  Yes Pahwani, Daleen Bo, MD  torsemide (DEMADEX) 20 MG tablet Take 120 mg by mouth 2 (two) times daily.   Yes [provider]  Cholecalciferol (VITAMIN D) 50 MCG (2000 UT) CAPS Take 2,000 Units by mouth daily.    [provider]  oxyCODONE (ROXICODONE) 5 MG immediate release tablet Take 1 tablet (5 mg total) by mouth every 8 (eight) hours as needed. Patient taking differently: Take 5 mg by mouth every 8 (eight) hours as needed for moderate pain. 12/31/22 12/31/23  [name], [name], DO  tadalafil (CIALIS) 20 MG tablet Take 20 mg by mouth daily as needed for erectile dysfunction.    [provider]  traZODone (DESYREL) 150 MG tablet Take 150 mg by mouth at bedtime. 12/09/21   [provider]    Physical Exam: Vitals:   04/05/23 1221 04/06/23 0647 04/06/23 1332  BP:  (!) 168/68 (!) 155/82  Pulse:  100 81  Resp:  18 17  Temp:  98.1 F (36.7 C) 98 F (36.7 C)  TempSrc:  Oral Oral  SpO2:  96% 100%  Weight: 133 kg 133 kg    Height: 5\' 5"  (1.651 m) 5\' 5"  (1.651 m)    General:  Appears calm and comfortable and is in NAD Eyes:   EOMI, normal lids, iris ENT:  grossly normal hearing, lips & tongue, mmm Neck:  no LAD, masses or thyromegaly Cardiovascular:  RRR, no m/r/g. No LE edema. L TDC present but he says "results are wonky" and it doesn't always work Respiratory:   CTA bilaterally with no wheezes/rales/rhonchi.  Normal respiratory effort. Abdomen:  soft, NT, ND Skin:  no rash or induration seen on limited exam Musculoskeletal:  s/p L AKA, stump C/D/I Psychiatric:  grossly normal mood and affect, speech fluent and appropriate, AOx3 Neurologic:  CN 2-12 grossly intact, moves all extremities in coordinated fashion   Radiological Exams on Admission: Independently reviewed - see discussion in A/P where applicable  No results found.  EKG: not done   Labs on Admission: I have personally reviewed the available labs and imaging studies at the time of the admission.  Pertinent labs:     Na++ 132 Glucose 576, 553 CO2 28 BUN 96/Creatinine 6.3 Hgb 9.2   Family Communication: None present Primary team communication: I spoke with the vascular PA at the time of the consult request.  Thank you very much for involving [place] in the care of your patient.  Author: Jonah Blue, MD 04/06/2023 3:22 PM  For on call review [email or web address].

## 2023-04-06 NOTE — Consult Note (Signed)
[facility] Renal Consultation Note    Indication for Consultation:  Management of ESRD/hemodialysis, anemia, hypertension/volume, and secondary hyperparathyroidism.  HPI: Jeffrey [name] is a 62 y.o. male with PMH including ESRD on dialysis, CHF, COPD, PTSH, sleep apnea, T2DM who presented today for AVF vs AVG and TDC exchange. Unfortunately, patient's blood sugar was too high and procedure was cancelled. Current plan is admission overnight and surgery once blood sugar is under control. Nephrology has been consulted for management of ESRD. Pt was discharged from his outpatient dialysis unit for behavioral issues and has not been accepted at a new unit at this time, so he is receiving dialysis through the ED. Last dialysis was 04/04/23, have been having issues with his catheter for the past week. Labs notable for NA 132, K+ 4.1, BUN 96, Cr 6.3, Hgb 9.2, glucose 576. BP 168/68, VS otherwise stable.   Patient reports he is hungry today, otherwise with no new concerns. Denies SOB, orthopnea, CP, palpitations, abdominal pain, nausea, vomiting, diarrhea, fatigue, weakness, fever, chills.   Past Medical History:  Diagnosis Date   Altered mental status 05/04/2021   Arthritis    Blood transfusion without reported diagnosis    CHF (congestive heart failure) (HCC)    COPD (chronic obstructive pulmonary disease) (HCC)    Depression    Diabetes mellitus without complication (HCC)    type 2   Dyspnea    ESRD on hemodialysis (HCC)    Tues Thurs Sat   GERD (gastroesophageal reflux disease)    protonix   Heart murmur    as a child, no problems   Hypertension    Myocardial infarction (HCC)    PTSD (post-traumatic stress disorder)    Sepsis (HCC)    Sleep apnea    occasional uses CPAP   Uses powered wheelchair    Past Surgical History:  Procedure Laterality Date   A/V FISTULAGRAM N/A 08/30/2021   Procedure: A/V FISTULAGRAM;  Surgeon: Maeola Harman, MD;  Location: [facility] INVASIVE  CV LAB;  Service: Cardiovascular;  Laterality: N/A;   A/V FISTULAGRAM Right 07/28/2022   Procedure: A/V Fistulagram;  Surgeon: Maeola Harman, MD;  Location: [facility] INVASIVE CV LAB;  Service: Cardiovascular;  Laterality: Right;   A/V FISTULAGRAM Right 09/05/2022   Procedure: A/V Fistulagram;  Surgeon: Maeola Harman, MD;  Location: [facility] INVASIVE CV LAB;  Service: Cardiovascular;  Laterality: Right;   A/V FISTULAGRAM Right 10/31/2022   Procedure: A/V Fistulagram;  Surgeon: Maeola Harman, MD;  Location: [facility] INVASIVE CV LAB;  Service: Cardiovascular;  Laterality: Right;   A/V SHUNT INTERVENTION Right 10/31/2022   Procedure: A/V SHUNT INTERVENTION;  Surgeon: Maeola Harman, MD;  Location: [facility] INVASIVE CV LAB;  Service: Cardiovascular;  Laterality: Right;   A/V SHUNTOGRAM N/A 11/08/2021   Procedure: A/V SHUNTOGRAM;  Surgeon: Maeola Harman, MD;  Location: [facility] INVASIVE CV LAB;  Service: Cardiovascular;  Laterality: N/A;   AMPUTATION Left 04/03/2021   Procedure: LEFT ABOVE-THE-KNEE AMPUTATION;  Surgeon: Terance Hart, MD;  Location: [facility] OR;  Service: Orthopedics;  Laterality: Left;   AV FISTULA PLACEMENT Right 08/12/2021   Procedure: INSERTION OF ARTERIOVENOUS (AV) GORE-TEX GRAFT RIGHT ARM;  Surgeon: Nada Libman, MD;  Location: [facility] OR;  Service: Vascular;  Laterality: Right;   INSERTION OF DIALYSIS CATHETER N/A 11/25/2022   Procedure: INSERTION OF DIALYSIS CATHETER;  Surgeon: Lorin Glass, MD;  Location: [facility] ENDOSCOPY;  Service: Pulmonary;  Laterality: N/A;   INSERTION OF DIALYSIS CATHETER N/A 03/03/2023  Procedure: INSERTION OF TUNNELED DIALYSIS CATHETER;  Surgeon: Maeola Harman, MD;  Location: [facility] OR;  Service: Vascular;  Laterality: N/A;   IR REMOVAL TUN CV CATH W/O FL  03/30/2021   JOINT REPLACEMENT     Bilateral knees   LIGATION ARTERIOVENOUS GORTEX GRAFT Right 03/03/2023   Procedure: LIGATION RIGHT ARM ARTERIOVENOUS GORTEX GRAFT;   Surgeon: Maeola Harman, MD;  Location: [facility] OR;  Service: Vascular;  Laterality: Right;   PERIPHERAL VASCULAR BALLOON ANGIOPLASTY Right 08/30/2021   Procedure: PERIPHERAL VASCULAR BALLOON ANGIOPLASTY;  Surgeon: Maeola Harman, MD;  Location: [facility] INVASIVE CV LAB;  Service: Cardiovascular;  Laterality: Right;   PERIPHERAL VASCULAR BALLOON ANGIOPLASTY Right 07/28/2022   Procedure: PERIPHERAL VASCULAR BALLOON ANGIOPLASTY;  Surgeon: Maeola Harman, MD;  Location: [facility] INVASIVE CV LAB;  Service: Cardiovascular;  Laterality: Right;  AVF   PERIPHERAL VASCULAR INTERVENTION  11/08/2021   Procedure: PERIPHERAL VASCULAR INTERVENTION;  Surgeon: Maeola Harman, MD;  Location: [facility] INVASIVE CV LAB;  Service: Cardiovascular;;  Central rt arm fistula   UPPER EXTREMITY VENOGRAPHY Right 08/10/2021   Procedure: UPPER EXTREMITY VENOGRAPHY;  Surgeon: Nada Libman, MD;  Location: [facility] INVASIVE CV LAB;  Service: Cardiovascular;  Laterality: Right;   History reviewed. No pertinent family history. Social History:  reports that he quit smoking about 12 years ago. His smoking use included cigarettes. He has never used smokeless tobacco. He reports current alcohol use. He reports that he does not currently use drugs.  ROS: As per HPI otherwise negative.   Physical Exam: Vitals:   04/05/23 1221 04/06/23 0647  BP:  (!) 168/68  Pulse:  100  Resp:  18  Temp:  98.1 F (36.7 C)  TempSrc:  Oral  SpO2:  96%  Weight: 133 kg 133 kg  Height: 5\' 5"  (1.651 m) 5\' 5"  (1.651 m)     General: Alert male in NAD Head: Normocephalic, atraumatic, sclera non-icteric, mucus membranes are moist. Lungs: Clear bilaterally to auscultation without wheezes, rales, or rhonchi. Breathing is unlabored. On O2 via [place] Heart: RRR with normal S1, S2. No murmurs, rubs, or gallops appreciated. Abdomen: Soft, non-tender, non-distended with normoactive bowel sounds.  Musculoskeletal:  Strength and tone appear  normal for age. Lower extremities: 1+ pitting edema b/l lower extremities Neuro: Alert and oriented X 3. Moves all extremities spontaneously. Psych:  Responds to questions appropriately with a normal affect. Dialysis Access: [facility]  Allergies  Allergen Reactions   Actos [Pioglitazone] Anaphylaxis and Rash   Dexmedetomidine Nausea And Vomiting    (Precedex) Dose-limiting bradycardia     Ibuprofen Shortness Of Breath and Swelling    Pt tolerates aspirin    Tomato Anaphylaxis    Only allergic to RAW tomatoes   Wellbutrin [Bupropion] Swelling   Prior to Admission medications   Medication Sig Start Date End Date Taking? Authorizing Provider  amLODipine (NORVASC) 10 MG tablet Take 10 mg by mouth every morning. 04/17/20  Yes [provider]  ASPIRIN LOW DOSE 81 MG EC tablet Take 81 mg by mouth in the morning. 02/22/21  Yes [provider]  atorvastatin (LIPITOR) 40 MG tablet Take 40 mg by mouth in the morning. 02/09/21  Yes [provider]  calcium acetate (PHOSLO) 667 MG capsule Take 2 capsules (1,334 mg total) by mouth with breakfast, with lunch, and with evening meal. 04/10/21  Yes Calvert Cantor, MD  carvedilol (COREG) 12.5 MG tablet Take 12.5 mg by mouth 2 (two) times daily with a meal. 04/17/20  Yes [provider]  clopidogrel (PLAVIX) 75 MG tablet Take 1 tablet (75 mg total) by mouth daily. 11/02/22  Yes Rhyne, Ames Coupe, PA-C  cyclobenzaprine (FLEXERIL) 10 MG tablet Take 10 mg by mouth 3 (three) times daily as needed for muscle spasms. 12/29/21  Yes [provider]  diazepam (VALIUM) 5 MG tablet Take 5 mg by mouth 2 (two) times daily as needed for muscle spasms. 12/09/21  Yes [provider]  insulin regular human CONCENTRATED (HUMULIN R U-500 KWIKPEN) 500 UNIT/ML KwikPen Inject 45 Units into the skin daily with supper. Patient taking differently: Inject 125 Units into the skin 2 (two) times daily with a meal. 09/04/21 08/30/24 Yes Ogbata,  Lafayette Dragon, MD  metolazone (ZAROXOLYN) 10 MG tablet Take 10 mg by mouth daily. 07/13/21  Yes [provider]  nortriptyline (PAMELOR) 10 MG capsule Take 20 mg by mouth at bedtime. 10/23/19 08/30/24 Yes [provider]  pantoprazole (PROTONIX) 40 MG tablet Take 40 mg by mouth daily before breakfast. 02/09/21  Yes [provider]  pregabalin (LYRICA) 75 MG capsule Take 1 capsule (75 mg total) by mouth See admin instructions. Daily.  Give after dialysis on dialysis days. 01/21/23  Yes [name], MD  sertraline (ZOLOFT) 100 MG tablet Take 100 mg by mouth in the morning. 02/22/21  Yes [provider]  tamsulosin (FLOMAX) 0.4 MG CAPS capsule Take 1 capsule (0.4 mg total) by mouth daily. 11/10/21  Yes Pahwani, Daleen Bo, MD  torsemide (DEMADEX) 20 MG tablet Take 120 mg by mouth 2 (two) times daily.   Yes [provider]  Cholecalciferol (VITAMIN D) 50 MCG (2000 UT) CAPS Take 2,000 Units by mouth daily.    [provider]  oxyCODONE (ROXICODONE) 5 MG immediate release tablet Take 1 tablet (5 mg total) by mouth every 8 (eight) hours as needed. Patient taking differently: Take 5 mg by mouth every 8 (eight) hours as needed for moderate pain. 12/31/22 12/31/23  [name], [name], DO  traZODone (DESYREL) 150 MG tablet Take 150 mg by mouth at bedtime. 12/09/21   [provider]   Current Facility-Administered Medications  Medication Dose Route Frequency Provider Last Rate Last Admin   acetaminophen (TYLENOL) tablet 650 mg  650 mg Oral Q6H PRN Jonah Blue, MD       Or   acetaminophen (TYLENOL) suppository 650 mg  650 mg Rectal Q6H PRN Jonah Blue, MD       amLODipine (NORVASC) tablet 10 mg  10 mg Oral q morning Jonah Blue, MD       aspirin EC tablet 81 mg  81 mg Oral q AM Jonah Blue, MD       atorvastatin (LIPITOR) tablet 40 mg  40 mg Oral q AM Jonah Blue, MD       calcium acetate (PHOSLO) capsule 1,334 mg  1,334 mg Oral TID with meals  Jonah Blue, MD       calcium carbonate (dosed in mg elemental calcium) suspension 500 mg of elemental calcium  500 mg of elemental calcium Oral Q6H PRN Jonah Blue, MD       camphor-menthol [facility]) lotion 1 Application  1 Application Topical Q8H PRN Jonah Blue, MD       And   hydrOXYzine (ATARAX) tablet 25 mg  25 mg Oral Q8H PRN Jonah Blue, MD       carvedilol (COREG) tablet 12.5 mg  12.5 mg Oral BID WC Jonah Blue, MD       chlorhexidine (PERIDEX) 0.12 % solution  clopidogrel (PLAVIX) tablet 75 mg  75 mg Oral Daily Jonah Blue, MD       cyclobenzaprine (FLEXERIL) tablet 10 mg  10 mg Oral TID PRN Jonah Blue, MD       diazepam (VALIUM) tablet 5 mg  5 mg Oral BID PRN Jonah Blue, MD       docusate sodium [facility]) enema 283 mg  1 enema Rectal PRN Jonah Blue, MD       feeding supplement (NEPRO CARB STEADY) liquid 237 mL  237 mL Oral TID PRN Jonah Blue, MD       heparin injection 5,000 Units  5,000 Units Subcutaneous Tedra Coupe Jonah Blue, MD       hydrALAZINE (APRESOLINE) injection 5 mg  5 mg Intravenous Q4H PRN Jonah Blue, MD       insulin aspart (novoLOG) injection 0-5 Units  0-5 Units Subcutaneous QHS Jonah Blue, MD       insulin aspart (novoLOG) injection 0-6 Units  0-6 Units Subcutaneous TID WC Jonah Blue, MD       insulin regular human CONCENTRATED (HUMULIN R) 500 UNIT/ML KwikPen 125 Units  125 Units Subcutaneous BID WC Jonah Blue, MD       metolazone (ZAROXOLYN) tablet 10 mg  10 mg Oral Daily Jonah Blue, MD       nortriptyline (PAMELOR) capsule 20 mg  20 mg Oral Noemi Chapel, MD       ondansetron [facility]) tablet 4 mg  4 mg Oral Q6H PRN Jonah Blue, MD       Or   ondansetron [facility]) injection 4 mg  4 mg Intravenous Q6H PRN Jonah Blue, MD       Melene Muller ON 04/07/2023] pantoprazole (PROTONIX) EC tablet 40 mg  40 mg Oral QAC breakfast Jonah Blue, MD       pregabalin (LYRICA) capsule 75 mg  75 mg  Oral See admin instructions Jonah Blue, MD       sertraline (ZOLOFT) tablet 100 mg  100 mg Oral q AM Jonah Blue, MD       sorbitol 70 % solution 30 mL  30 mL Oral PRN Jonah Blue, MD       tamsulosin [facility]) capsule 0.4 mg  0.4 mg Oral Daily Jonah Blue, MD       torsemide [facility]) tablet 120 mg  120 mg Oral BID Jonah Blue, MD       traZODone (DESYREL) tablet 150 mg  150 mg Oral Noemi Chapel, MD       zolpidem [facility]) tablet 5 mg  5 mg Oral QHS PRN Jonah Blue, MD       Labs: Basic Metabolic Panel: Recent Labs  Lab 03/30/23 1248 04/04/23 1140 04/06/23 0713  NA 133* 134* 132*  K 4.0 3.7 4.1  CL 94* 88* 91*  CO2 30 29  --   GLUCOSE 316* 263* 576*  BUN 73* 110* 96*  CREATININE 5.47* 6.41* 6.30*  CALCIUM 8.6* 7.8*  --   PHOS 4.5 5.7*  --    Liver Function Tests: Recent Labs  Lab 03/30/23 1248 04/04/23 1140  ALBUMIN 3.6 3.6   No results for input(s): "LIPASE", "AMYLASE" in the last 168 hours. No results for input(s): "AMMONIA" in the last 168 hours. CBC: Recent Labs  Lab 03/30/23 1248 04/04/23 1154 04/06/23 0713  WBC 5.3 6.5  --   HGB 8.0* 8.6* 9.2*  HCT 26.1* 26.8* 27.0*  MCV 91.3 89.3  --   PLT 96* 130*  --  CBG: Recent Labs  Lab 04/06/23 0650 04/06/23 0830 04/06/23 0932  GLUCAP 578* 553* 488*     Dialysis Orders: Last OP HD orders (dec 2023):  3:15h  129kg  RUE AVG   Heparin 2000  Currently no outpatient HD center  Assessment/Plan:  Hyperglycemia: Presented for surgery with glucose >500. He is asymptomatic. Insulin per admitting team Dialysis access issue: Has been having issues with [facility]  over the past 1-2 weeks, was scheduled for permanent access and TDC exchange but surgery was cancelled due to hyperglycemia. Surgery rescheduled for tomorrow.  ESRD:  On TTS schedule, was discharged from his outpatient dialysis unit due to behavioral issues and unfortunately has not been accepted at another unit yet, so receiving  dialysis through the ED. HD today, see above.  Hypertension/volume: BP elevated and has edema on exam. UFG 3L today as tolerated.   Anemia: Hgb 9.2- last received aranesp on 03/30/23, will order a dose for today.   Metabolic bone disease: Will check RFP with HD, not on VDRA. Continue calcium acetate.   Nutrition:  Currently NPO  [name], PA-C 04/06/2023, 11:55 AM  [facility] Pager: [phone]

## 2023-04-06 NOTE — Progress Notes (Signed)
Pt"s surgery cancelled due to elevated blood sugars >500.Pt admitted to hospitalist service. Transferred to bed 6North. Report given to [facility] LPN. Pt placed in bed 6N01;placed on O2 3L;call bell within reach. Siderails up x3.  Tameka came in room to meet patient.

## 2023-04-06 NOTE — Interval H&P Note (Signed)
History and Physical Interval Note:  04/06/2023 7:15 AM  Jeffrey [name]  has presented today for surgery, with the diagnosis of End Stage Renal Disease.  The various methods of treatment have been discussed with the patient and family. After consideration of risks, benefits and other options for treatment, the patient has consented to  Procedure(s): LEFT ARM ARTERIOVENOUS (AV) FISTULA CREATION VERSUS GRAFT (Left) as a surgical intervention.  The patient's history has been reviewed, patient examined, no change in status, stable for surgery.  I have reviewed the patient's chart and labs.  Questions were answered to the patient's satisfaction.     Lemar Livings

## 2023-04-06 NOTE — Assessment & Plan Note (Addendum)
-  Patient on chronic TTS HD - but has to get this through the ER reportedly -Would consider a behavior contract for him as an outpatient as a condition for ongoing HD, as the plan for serial HD through the ER is not sustainable -Nephrology prn order set utilized -Nephrology is aware that patient will need HD prior to dc -Continue Phoslo -Most recent echo showed normal systolic and diastolic function but he is on torsemide and daily metolazone; consider the necessity of these medications, as less meds may improve his compliance -He will need AV fistula/graft and/or [facility] tomorrow prior to dc

## 2023-04-07 ENCOUNTER — Observation Stay (HOSPITAL_COMMUNITY): Payer: 59

## 2023-04-07 ENCOUNTER — Observation Stay (HOSPITAL_COMMUNITY): Payer: 59 | Admitting: Certified Registered Nurse Anesthetist

## 2023-04-07 ENCOUNTER — Encounter (HOSPITAL_COMMUNITY): Payer: Self-pay | Admitting: Internal Medicine

## 2023-04-07 ENCOUNTER — Inpatient Hospital Stay (HOSPITAL_COMMUNITY): Admission: RE | Admit: 2023-04-07 | Payer: 59 | Source: Home / Self Care | Admitting: Vascular Surgery

## 2023-04-07 ENCOUNTER — Other Ambulatory Visit: Payer: Self-pay

## 2023-04-07 ENCOUNTER — Observation Stay (HOSPITAL_BASED_OUTPATIENT_CLINIC_OR_DEPARTMENT_OTHER): Payer: 59 | Admitting: Certified Registered Nurse Anesthetist

## 2023-04-07 ENCOUNTER — Encounter (HOSPITAL_COMMUNITY)
Admission: RE | Disposition: A | Discharge status: Home / Self Care | Payer: Self-pay | Source: Home / Self Care | Attending: Family Medicine

## 2023-04-07 DIAGNOSIS — N185 Chronic kidney disease, stage 5: Secondary | ICD-10-CM | POA: Diagnosis not present

## 2023-04-07 DIAGNOSIS — N186 End stage renal disease: Secondary | ICD-10-CM

## 2023-04-07 DIAGNOSIS — Z992 Dependence on renal dialysis: Secondary | ICD-10-CM

## 2023-04-07 DIAGNOSIS — E1122 Type 2 diabetes mellitus with diabetic chronic kidney disease: Secondary | ICD-10-CM

## 2023-04-07 DIAGNOSIS — I252 Old myocardial infarction: Secondary | ICD-10-CM | POA: Diagnosis not present

## 2023-04-07 DIAGNOSIS — I132 Hypertensive heart and chronic kidney disease with heart failure and with stage 5 chronic kidney disease, or end stage renal disease: Secondary | ICD-10-CM

## 2023-04-07 DIAGNOSIS — Z794 Long term (current) use of insulin: Secondary | ICD-10-CM

## 2023-04-07 DIAGNOSIS — E1165 Type 2 diabetes mellitus with hyperglycemia: Secondary | ICD-10-CM | POA: Diagnosis not present

## 2023-04-07 DIAGNOSIS — I509 Heart failure, unspecified: Secondary | ICD-10-CM

## 2023-04-07 HISTORY — PX: AV FISTULA PLACEMENT: SHX1204

## 2023-04-07 HISTORY — PX: INSERTION OF DIALYSIS CATHETER: SHX1324

## 2023-04-07 LAB — GLUCOSE, CAPILLARY
Glucose-Capillary: 398 mg/dL — ABNORMAL HIGH (ref 70–99)
Glucose-Capillary: 338 mg/dL — ABNORMAL HIGH (ref 70–99)
Glucose-Capillary: 327 mg/dL — ABNORMAL HIGH (ref 70–99)
Glucose-Capillary: 146 mg/dL — ABNORMAL HIGH (ref 70–99)
Glucose-Capillary: 196 mg/dL — ABNORMAL HIGH (ref 70–99)
Glucose-Capillary: 323 mg/dL — ABNORMAL HIGH (ref 70–99)

## 2023-04-07 LAB — RENAL FUNCTION PANEL
Albumin: 3.5 g/dL (ref 3.5–5.0)
Anion gap: 12 (ref 5–15)
BUN: 86 mg/dL — ABNORMAL HIGH (ref 8–23)
CO2: 29 mmol/L (ref 22–32)
Calcium: 7.4 mg/dL — ABNORMAL LOW (ref 8.9–10.3)
Chloride: 91 mmol/L — ABNORMAL LOW (ref 98–111)
Creatinine, Ser: 5.45 mg/dL — ABNORMAL HIGH (ref 0.61–1.24)
GFR, Estimated: 11 mL/min — ABNORMAL LOW (ref >60)
Glucose, Bld: 340 mg/dL — ABNORMAL HIGH (ref 70–99)
Phosphorus: 5.6 mg/dL — ABNORMAL HIGH (ref 2.5–4.6)
Potassium: 3.6 mmol/L (ref 3.5–5.1)
Sodium: 132 mmol/L — ABNORMAL LOW (ref 135–145)

## 2023-04-07 LAB — CBC
HCT: 24.4 % — ABNORMAL LOW (ref 39.0–52.0)
Hemoglobin: 8.1 g/dL — ABNORMAL LOW (ref 13.0–17.0)
MCH: 29.5 pg (ref 26.0–34.0)
MCHC: 33.2 g/dL (ref 30.0–36.0)
MCV: 88.7 fL (ref 80.0–100.0)
Platelets: 105 10*3/uL — ABNORMAL LOW (ref 150–400)
RBC: 2.75 MIL/uL — ABNORMAL LOW (ref 4.22–5.81)
RDW: 14.5 % (ref 11.5–15.5)
WBC: 5.6 10*3/uL (ref 4.0–10.5)
nRBC: 0 % (ref 0.0–0.2)

## 2023-04-07 LAB — HEPATITIS B SURFACE ANTIGEN: Hepatitis B Surface Ag: NONREACTIVE

## 2023-04-07 SURGERY — ARTERIOVENOUS (AV) FISTULA CREATION
Anesthesia: Monitor Anesthesia Care | Site: Neck | Laterality: Left

## 2023-04-07 MED ORDER — FENTANYL CITRATE (PF) 100 MCG/2ML IJ SOLN
INTRAMUSCULAR | Status: AC
  Administered 2023-04-07: 50 ug via INTRAVENOUS
  Filled 2023-04-07: qty 2

## 2023-04-07 MED ORDER — INSULIN ASPART 100 UNIT/ML IJ SOLN
INTRAMUSCULAR | Status: AC
  Filled 2023-04-07: qty 1

## 2023-04-07 MED ORDER — LIDOCAINE-EPINEPHRINE (PF) 1 %-1:200000 IJ SOLN
INTRAMUSCULAR | Status: AC
  Filled 2023-04-07: qty 30

## 2023-04-07 MED ORDER — PHENYLEPHRINE HCL-NACL 20-0.9 MG/250ML-% IV SOLN
INTRAVENOUS | Status: DC | PRN
  Administered 2023-04-07 (×2): 80 ug via INTRAVENOUS
  Administered 2023-04-07: 15 ug/min via INTRAVENOUS

## 2023-04-07 MED ORDER — HYDROCODONE-ACETAMINOPHEN 5-325 MG PO TABS
1.0000 | ORAL_TABLET | ORAL | Status: DC | PRN
  Administered 2023-04-07 – 2023-04-08 (×2): 2 via ORAL
  Filled 2023-04-07 (×2): qty 2

## 2023-04-07 MED ORDER — SODIUM CHLORIDE 0.9 % IV SOLN: INTRAVENOUS | Status: DC

## 2023-04-07 MED ORDER — DEXMEDETOMIDINE HCL IN NACL 80 MCG/20ML IV SOLN
INTRAVENOUS | Status: DC | PRN
  Administered 2023-04-07 (×3): 4 ug via INTRAVENOUS

## 2023-04-07 MED ORDER — FENTANYL CITRATE (PF) 100 MCG/2ML IJ SOLN: 25.0000 ug | INTRAMUSCULAR | Status: DC | PRN

## 2023-04-07 MED ORDER — CHLORHEXIDINE GLUCONATE CLOTH 2 % EX PADS
6.0000 | MEDICATED_PAD | Freq: Every day | CUTANEOUS | Status: DC
  Administered 2023-04-08: 6 via TOPICAL

## 2023-04-07 MED ORDER — ORAL CARE MOUTH RINSE: 15.0000 mL | Freq: Once | OROMUCOSAL | Status: DC

## 2023-04-07 MED ORDER — HEPARIN 6000 UNIT IRRIGATION SOLUTION
Status: DC | PRN
  Administered 2023-04-07: 1

## 2023-04-07 MED ORDER — FENTANYL CITRATE (PF) 250 MCG/5ML IJ SOLN
INTRAMUSCULAR | Status: DC | PRN
  Administered 2023-04-07 (×2): 25 ug via INTRAVENOUS

## 2023-04-07 MED ORDER — HEPARIN 6000 UNIT IRRIGATION SOLUTION
Status: AC
  Filled 2023-04-07: qty 500

## 2023-04-07 MED ORDER — HEMOSTATIC AGENTS (NO CHARGE) OPTIME
TOPICAL | Status: DC | PRN
  Administered 2023-04-07: 1 via TOPICAL

## 2023-04-07 MED ORDER — CHLORHEXIDINE GLUCONATE 0.12 % MT SOLN: 15.0000 mL | Freq: Once | OROMUCOSAL | Status: AC

## 2023-04-07 MED ORDER — MIDAZOLAM HCL 2 MG/2ML IJ SOLN
INTRAMUSCULAR | Status: AC
  Filled 2023-04-07: qty 2

## 2023-04-07 MED ORDER — MUPIROCIN 2 % EX OINT
1.0000 | TOPICAL_OINTMENT | Freq: Two times a day (BID) | CUTANEOUS | Status: DC
  Administered 2023-04-07 – 2023-04-08 (×3): 1 via NASAL
  Filled 2023-04-07 (×2): qty 22

## 2023-04-07 MED ORDER — LIDOCAINE HCL (PF) 1 % IJ SOLN
INTRAMUSCULAR | Status: DC | PRN
  Administered 2023-04-07: 30 mL

## 2023-04-07 MED ORDER — CHLORHEXIDINE GLUCONATE 0.12 % MT SOLN
OROMUCOSAL | Status: AC
  Filled 2023-04-07: qty 15

## 2023-04-07 MED ORDER — FENTANYL CITRATE (PF) 250 MCG/5ML IJ SOLN
INTRAMUSCULAR | Status: AC
  Filled 2023-04-07: qty 5

## 2023-04-07 MED ORDER — PROPOFOL 10 MG/ML IV BOLUS
INTRAVENOUS | Status: AC
  Filled 2023-04-07: qty 20

## 2023-04-07 MED ORDER — PROTAMINE SULFATE 10 MG/ML IV SOLN
INTRAVENOUS | Status: DC | PRN
  Administered 2023-04-07: 20 mg via INTRAVENOUS
  Administered 2023-04-07: 50 mg via INTRAVENOUS

## 2023-04-07 MED ORDER — 0.9 % SODIUM CHLORIDE (POUR BTL) OPTIME
TOPICAL | Status: DC | PRN
  Administered 2023-04-07: 1000 mL

## 2023-04-07 MED ORDER — MIDAZOLAM HCL 2 MG/2ML IJ SOLN
INTRAMUSCULAR | Status: DC | PRN
  Administered 2023-04-07 (×4): .5 mg via INTRAVENOUS

## 2023-04-07 MED ORDER — MIDAZOLAM HCL 2 MG/2ML IJ SOLN
INTRAMUSCULAR | Status: AC
  Administered 2023-04-07: 1 mg via INTRAVENOUS
  Filled 2023-04-07: qty 2

## 2023-04-07 MED ORDER — DARBEPOETIN ALFA 60 MCG/0.3ML IJ SOSY
60.0000 ug | PREFILLED_SYRINGE | INTRAMUSCULAR | Status: DC
  Administered 2023-04-07: 60 ug via SUBCUTANEOUS
  Filled 2023-04-07: qty 0.3

## 2023-04-07 MED ORDER — LIDOCAINE HCL (PF) 1 % IJ SOLN
INTRAMUSCULAR | Status: AC
  Filled 2023-04-07: qty 30

## 2023-04-07 MED ORDER — HEPARIN SODIUM (PORCINE) 1000 UNIT/ML IJ SOLN
INTRAMUSCULAR | Status: DC | PRN
  Administered 2023-04-07: 10000 [IU] via INTRAVENOUS

## 2023-04-07 MED ORDER — HEPARIN SODIUM (PORCINE) 1000 UNIT/ML IJ SOLN
INTRAMUSCULAR | Status: AC
  Filled 2023-04-07: qty 10

## 2023-04-07 MED ORDER — ORAL CARE MOUTH RINSE: 15.0000 mL | Freq: Once | OROMUCOSAL | Status: AC

## 2023-04-07 MED ORDER — INSULIN ASPART 100 UNIT/ML IJ SOLN
0.0000 [IU] | INTRAMUSCULAR | Status: AC | PRN
  Administered 2023-04-07: 2 [IU] via SUBCUTANEOUS
  Administered 2023-04-07: 5 [IU] via SUBCUTANEOUS

## 2023-04-07 MED ORDER — CHLORHEXIDINE GLUCONATE CLOTH 2 % EX PADS: 6.0000 | MEDICATED_PAD | Freq: Every day | CUTANEOUS | Status: DC

## 2023-04-07 MED ORDER — FENTANYL CITRATE (PF) 100 MCG/2ML IJ SOLN: 50.0000 ug | Freq: Once | INTRAMUSCULAR | Status: AC

## 2023-04-07 MED ORDER — ROPIVACAINE HCL 5 MG/ML IJ SOLN
INTRAMUSCULAR | Status: DC | PRN
  Administered 2023-04-07: 30 mL via PERINEURAL

## 2023-04-07 MED ORDER — PROPOFOL 500 MG/50ML IV EMUL
INTRAVENOUS | Status: DC | PRN
  Administered 2023-04-07: 50 ug/kg/min via INTRAVENOUS

## 2023-04-07 MED ORDER — DEXTROSE 5 % IV SOLN
INTRAVENOUS | Status: DC | PRN
  Administered 2023-04-07: 3 g via INTRAVENOUS

## 2023-04-07 MED ORDER — HYDROMORPHONE HCL 1 MG/ML IJ SOLN
0.5000 mg | INTRAMUSCULAR | Status: DC | PRN
  Administered 2023-04-07: 0.5 mg via INTRAVENOUS
  Filled 2023-04-07: qty 0.5

## 2023-04-07 MED ORDER — MIDAZOLAM HCL 2 MG/2ML IJ SOLN: 1.0000 mg | Freq: Once | INTRAMUSCULAR | Status: AC

## 2023-04-07 MED ORDER — CHLORHEXIDINE GLUCONATE 0.12 % MT SOLN: 15.0000 mL | Freq: Once | OROMUCOSAL | Status: DC

## 2023-04-07 MED ORDER — HEPARIN SODIUM (PORCINE) 1000 UNIT/ML IJ SOLN
INTRAMUSCULAR | Status: DC | PRN
  Administered 2023-04-07: 4200 [IU]

## 2023-04-07 MED ORDER — LIDOCAINE-EPINEPHRINE (PF) 1 %-1:200000 IJ SOLN
INTRAMUSCULAR | Status: DC | PRN
  Administered 2023-04-07: 24 mL

## 2023-04-07 SURGICAL SUPPLY — 49 items
ADH SKN CLS APL DERMABOND .7 (GAUZE/BANDAGES/DRESSINGS) ×4
AGENT HMST 10 BLLW SHRT CANN (HEMOSTASIS) ×2
ARMBAND PINK RESTRICT EXTREMIT (MISCELLANEOUS) ×2 IMPLANT
BAG COUNTER SPONGE SURGICOUNT (BAG) ×2 IMPLANT
BAG SPNG CNTER NS LX DISP (BAG) ×2
BIOPATCH RED 1 DISK 7.0 (GAUZE/BANDAGES/DRESSINGS) IMPLANT
CANISTER SUCT 3000ML PPV (MISCELLANEOUS) ×2 IMPLANT
CATH BEACON 5 .035 40 KMP TP (CATHETERS) IMPLANT
CATH BEACON 5 .038 40 KMP TP (CATHETERS) ×2
CLIP LIGATING EXTRA MED SLVR (CLIP) ×2 IMPLANT
CLIP LIGATING EXTRA SM BLUE (MISCELLANEOUS) ×2 IMPLANT
CLIP TI MEDIUM 6 (CLIP) IMPLANT
CLIP TI WIDE RED SMALL 6 (CLIP) IMPLANT
COVER PROBE W GEL 5X96 (DRAPES) IMPLANT
DERMABOND ADVANCED .7 DNX12 (GAUZE/BANDAGES/DRESSINGS) ×2 IMPLANT
ELECT REM PT RETURN 9FT ADLT (ELECTROSURGICAL) ×2
ELECTRODE REM PT RTRN 9FT ADLT (ELECTROSURGICAL) ×2 IMPLANT
GLOVE BIO SURGEON STRL SZ 6.5 (GLOVE) IMPLANT
GLOVE BIO SURGEON STRL SZ7.5 (GLOVE) ×2 IMPLANT
GLOVE SS BIOGEL STRL SZ 7 (GLOVE) IMPLANT
GOWN STRL REUS W/ TWL LRG LVL3 (GOWN DISPOSABLE) ×4 IMPLANT
GOWN STRL REUS W/ TWL XL LVL3 (GOWN DISPOSABLE) ×2 IMPLANT
GOWN STRL REUS W/TWL LRG LVL3 (GOWN DISPOSABLE) ×4
GOWN STRL REUS W/TWL XL LVL3 (GOWN DISPOSABLE) ×2
GRAFT GORETEX STRT 4-7X45 (Vascular Products) IMPLANT
HEMOSTAT HEMOBLAST BELLOWS (HEMOSTASIS) IMPLANT
INSERT FOGARTY SM (MISCELLANEOUS) IMPLANT
KIT BASIN OR (CUSTOM PROCEDURE TRAY) ×2 IMPLANT
KIT TURNOVER KIT B (KITS) ×2 IMPLANT
NDL 18GX1X1/2 (RX/OR ONLY) (NEEDLE) IMPLANT
NEEDLE 18GX1X1/2 (RX/OR ONLY) (NEEDLE) ×2 IMPLANT
NS IRRIG 1000ML POUR BTL (IV SOLUTION) ×2 IMPLANT
PACK CV ACCESS (CUSTOM PROCEDURE TRAY) ×2 IMPLANT
PAD ARMBOARD 7.5X6 YLW CONV (MISCELLANEOUS) ×4 IMPLANT
POWDER SURGICEL 3.0 GRAM (HEMOSTASIS) IMPLANT
SLING ARM FOAM STRAP LRG (SOFTGOODS) IMPLANT
SLING ARM FOAM STRAP MED (SOFTGOODS) IMPLANT
SPONGE T-LAP 18X18 ~~LOC~~+RFID (SPONGE) IMPLANT
SUT ETHILON 3 0 PS 1 (SUTURE) IMPLANT
SUT MNCRL AB 4-0 PS2 18 (SUTURE) ×2 IMPLANT
SUT PROLENE 6 0 BV (SUTURE) ×2 IMPLANT
SUT VIC AB 3-0 SH 27 (SUTURE) ×4
SUT VIC AB 3-0 SH 27X BRD (SUTURE) ×2 IMPLANT
SYR 5ML LL (SYRINGE) IMPLANT
TOWEL GREEN STERILE (TOWEL DISPOSABLE) ×2 IMPLANT
UNDERPAD 30X36 HEAVY ABSORB (UNDERPADS AND DIAPERS) ×2 IMPLANT
WATER STERILE IRR 1000ML POUR (IV SOLUTION) ×2 IMPLANT
WIRE AMPLATZ SS-J .035X180CM (WIRE) IMPLANT
WIRE BENTSON .035X145CM (WIRE) IMPLANT

## 2023-04-07 NOTE — Op Note (Signed)
NAME: Jeffrey [name]    MRN: [id number] DOB: 1961-11-15    DATE OF OPERATION: 04/07/2023  PREOP DIAGNOSIS:    End-stage renal disease  POSTOP DIAGNOSIS:    Same  PROCEDURE:    Placement of left IJ 28 cm tunneled dialysis catheter Placement of new left upper arm AV graft (4-7 mm PTFE graft)  SURGEON: Di Kindle. Edilia Bo, MD  ASSIST: Aggie Moats, PA  ANESTHESIA: Local with sedation and axillary block  EBL: Minimal  INDICATIONS:    Jeffrey [name] is a 62 y.o. male who presents for new access.  FINDINGS:   I could not identify the right IJ by SonoSite so I changed the left-sided catheter over a wire.  TECHNIQUE:   The patient was taken to the operating room after a block was placed by anesthesia.  The patient had a very short neck.  It was difficult to visualize the neck for placement of the catheter.  I did look at the right IJ and could not identify a patent vein.  Therefore I felt that the safest option was to change the current catheter over a wire.  This was a 23 cm catheter.  As this was not functioning adequately I felt that placing a longer catheter was probably the best option.  After the skin was anesthetized and made a transverse incision at the entrance site to the IJ.  The catheter here was dissected free clamped and divided.  I then tunneled a new 28 cm catheter.  Next a wire was passed to the old catheter and the old catheter removed.  There was significant tortuosity in the brachiocephalic system and therefore elected to exchange for an Amplatz wire over a Kumpe catheter.  Over the Amplatz wire then placed the dilator and peel-away sheath into the SVC.  The catheter was placed through the peel-away sheath and positioned down into the right atrium and the peel-away sheath was removed.  Both ports withdrew easily with and flushed with heparinized saline and filled with concentrated heparin.  The catheter was secured at its exit site with a 3-0 nylon.  The IJ  cannulation site was closed with 4-0 Monocryl.  Sterile dressing was applied.  Next attention was turned to the left arm.  Left arm was prepped and draped in usual sterile fashion.  I looked at the veins and felt that this best option would be an upper arm graft.  A longitudinal incision was made over the previous brachial cephalic anastomosis.  The artery was dissected free here and the fistula was ligated and divided.  This was nonfunctional.  Separate longitudinal incision was made beneath the axilla and here the high brachial vein was dissected free.  A 4-7 mm PTFE graft was then tunneled between the 2 incisions.  The patient was heparinized.  The brachial artery was clamped proximally and distally and I open the remnant of the brachiocephalic fistula which was widely patent.  A short segment of the 4 mm end of the graft was excised, the graft spatulated and sewn end-to-side to the artery at this level using continuous 6-0 Prolene suture.  The graft some pulled the appropriate length for anastomosis to the high brachial vein.  The vein was ligated distally and spatulated proximally.  The graft cut to the appropriate length spatulated and sewn into into the vein using 2 continuous 6-0 Prolene sutures.  At the completion there was an excellent thrill in the graft.  There is a palpable radial pulse.  Hemostasis was obtained in the wounds.  The wounds were each closed with a deep layer of 3-0 Vicryl and the skin closed with 4-0 Monocryl.  Dermabond was applied.  The patient tolerated the procedure well and was transferred to the recovery room in stable condition.  All needle and sponge counts were correct.  Given the complexity of the case,  the assistant was necessary in order to expedient the procedure and safely perform the technical aspects of the operation.  The assistant provided traction and countertraction to assist with exposure of the artery and vein.  They also assisted with suture ligation of  multiple venous branches. They also assisted with tunneling of the graft.  They played a critical role for both anastomoses.. These skills, especially following the Prolene suture for the anastomosis, could not have been adequately performed by a scrub tech assistant.    [name], MD, FACS [facility]  DATE OF DICTATION:   04/07/2023

## 2023-04-07 NOTE — Anesthesia Postprocedure Evaluation (Signed)
Anesthesia Post Note  Patient: Jeffrey [name]  Procedure(s) Performed: ARTERIOVENOUS (AV) FISTULA GRAFT USING GORETEX STRETCH (4-7MM) (Left: Arm Lower) INSERTION OF DIALYSIS CATHETER USING PALINDROME  28CM PRECISION CHRONIC CATHETER KIT (Left: Neck)     Patient location during evaluation: PACU Anesthesia Type: Regional and MAC Level of consciousness: awake Pain management: pain level controlled Vital Signs Assessment: post-procedure vital signs reviewed and stable Respiratory status: spontaneous breathing, nonlabored ventilation and respiratory function stable Cardiovascular status: stable and blood pressure returned to baseline Postop Assessment: no apparent nausea or vomiting Anesthetic complications: no  No notable events documented.  Last Vitals:  Vitals:   04/07/23 1719 04/07/23 1938  BP: 133/66 136/83  Pulse: 84 92  Resp: 16 12  Temp: 37 C 37 C  SpO2: 99% 98%    Last Pain:  Vitals:   04/07/23 1938  TempSrc: Oral  PainSc:                  Linton Rump
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
## 2023-04-07 NOTE — Progress Notes (Signed)
[facility] Progress Note   62 y.o. male with PMH including ESRD on dialysis, CHF, COPD, PTSH, sleep apnea, T2DM who presented today for AVF vs AVG and TDC exchange. Unfortunately, patient's blood sugar was too high and procedure was cancelled. Current plan is admission overnight and surgery once blood sugar is under control. Nephrology has been consulted for management of ESRD.   Dialysis Orders: Last OP HD orders (dec 2023):  3:15h  129kg  RUE AVG   Heparin 2000  Currently no outpatient HD center   Assessment/ Plan:    Hyperglycemia: Presented for surgery with glucose >500. He is asymptomatic. Insulin per admitting team Dialysis access issue: Has been having issues with [facility]  over the past 1-2 weeks, was scheduled for permanent access and TDC exchange but surgery was cancelled due to hyperglycemia. Surgery rescheduled for today at 130P. Will dialyze afterwards  ESRD:  On TTS schedule, was discharged from his outpatient dialysis unit due to behavioral issues and unfortunately has not been accepted at another unit yet, so receiving dialysis through the ED. HD today, see above.  Hypertension/volume: BP elevated and has edema on exam. UFG 3L today as tolerated.   Anemia: Hgb 8.1 - unclear when he last received aranesp, has been refusing ESA or iron.  Metabolic bone disease: Will check RFP with HD, not on VDRA. Continue calcium acetate.   Nutrition:  Currently NPO  Subjective:   Hungry but denies sob/f/c/n/v.   Objective:   BP (!) 125/52 (BP Location: Right Arm)   Pulse 83   Temp 98.6 F (37 C) (Oral)   Resp 19   Ht 5\' 5"  (1.651 m)   Wt 134 kg   SpO2 99%   BMI 49.16 kg/m   Intake/Output Summary (Last 24 hours) at 04/07/2023 1610 Last data filed at 04/06/2023 1619 Gross per 24 hour  Intake 60 ml  Output 250 ml  Net -190 ml   Weight change: 1 kg  Physical Exam: General: Alert male in NAD Head: NCAT Lungs: CTA b/l on 3L [place] Heart: RRR with normal S1, S2.   Abdomen: Soft, non-tender, non-distended obese Musculoskeletal:  Strength and tone appear normal for age. Lower extremities: 1+ pitting edema b/l lower extremities Neuro: Alert and oriented X 3. Moves all extremities spontaneously. Psych:  Responds to questions appropriately with a normal affect. Dialysis Access: [facility]  Imaging: No results found.  Labs: BMET Recent Labs  Lab 04/04/23 1140 04/06/23 0713 04/07/23 0306  NA 134* 132* 132*  K 3.7 4.1 3.6  CL 88* 91* 91*  CO2 29  --  29  GLUCOSE 263* 576* 340*  BUN 110* 96* 86*  CREATININE 6.41* 6.30* 5.45*  CALCIUM 7.8*  --  7.4*  PHOS 5.7*  --  5.6*   CBC Recent Labs  Lab 04/04/23 1154 04/06/23 0713 04/07/23 0306  WBC 6.5  --  5.6  HGB 8.6* 9.2* 8.1*  HCT 26.8* 27.0* 24.4*  MCV 89.3  --  88.7  PLT 130*  --  105*    Medications:     amLODipine  10 mg Oral q morning   aspirin EC  81 mg Oral q AM   atorvastatin  40 mg Oral q AM   calcium acetate  1,334 mg Oral TID with meals   carvedilol  12.5 mg Oral BID WC   Chlorhexidine Gluconate Cloth  6 each Topical Q0600   clopidogrel  75 mg Oral Daily   darbepoetin (ARANESP) injection - DIALYSIS  60  mcg Subcutaneous Q Thu-1800   heparin  2,000 Units Dialysis Once in dialysis   heparin  5,000 Units Subcutaneous Q8H   insulin aspart  0-5 Units Subcutaneous QHS   insulin aspart  0-6 Units Subcutaneous TID WC   insulin regular human CONCENTRATED  70 Units Subcutaneous BID WC   metolazone  10 mg Oral Daily   nortriptyline  20 mg Oral QHS   pantoprazole  40 mg Oral QAC breakfast   pregabalin  75 mg Oral QHS   sertraline  100 mg Oral q AM   tamsulosin  0.4 mg Oral Daily   torsemide  120 mg Oral BID   traZODone  150 mg Oral QHS      Paulene Floor, MD 04/07/2023, 7:27 AM

## 2023-04-07 NOTE — Transfer of Care (Signed)
Immediate Anesthesia Transfer of Care Note  Patient: Jeffrey [name]  Procedure(s) Performed: ARTERIOVENOUS (AV) FISTULA GRAFT USING GORETEX STRETCH (4-7MM) (Left: Arm Lower) INSERTION OF DIALYSIS CATHETER USING PALINDROME  28CM PRECISION CHRONIC CATHETER KIT (Left: Neck)  Patient Location: PACU  Anesthesia Type:MAC and Regional  Level of Consciousness: awake, alert , and oriented  Airway & Oxygen Therapy: Patient Spontanous Breathing and Patient connected to face mask oxygen  Post-op Assessment: Report given to RN and Post -op Vital signs reviewed and stable  Post vital signs: Reviewed and stable  Last Vitals:  Vitals Value Taken Time  BP 124/64 04/07/23 1621  Temp    Pulse 88 04/07/23 1624  Resp 15 04/07/23 1624  SpO2 98 % 04/07/23 1624  Vitals shown include unvalidated device data.  Last Pain:  Vitals:   04/07/23 1148  TempSrc:   PainSc: 0-No pain         Complications: No notable events documented.

## 2023-04-07 NOTE — Interval H&P Note (Signed)
History and Physical Interval Note:  04/07/2023 12:36 PM  Jeffrey [name]  has presented today for surgery, with the diagnosis of RENAL FAILURE.  The various methods of treatment have been discussed with the patient and family. After consideration of risks, benefits and other options for treatment, the patient has consented to  Procedure(s): ARTERIOVENOUS (AV) FISTULA CREATION VS GRAFT (Left) as a surgical intervention.  The patient's history has been reviewed, patient examined, no change in status, stable for surgery.  I have reviewed the patient's chart and labs.  Questions were answered to the patient's satisfaction.     [name]

## 2023-04-07 NOTE — Discharge Instructions (Signed)
° °  [facility] ° °Discharge Instructions ° °AV Fistula or Graft Surgery for Dialysis Access ° °Please refer to the following instructions for your post-procedure care. Your surgeon or physician assistant will discuss any changes with you. ° °Activity ° °You may drive the day following your surgery, if you are comfortable and no longer taking prescription pain medication. Resume full activity as the soreness in your incision resolves. ° °Bathing/Showering ° °You may shower after you go home. Keep your incision dry for 48 hours. Do not soak in a bathtub, hot tub, or swim until the incision heals completely. You may not shower if you have a hemodialysis catheter. ° °Incision Care ° °Clean your incision with mild soap and water after 48 hours. Pat the area dry with a clean towel. You do not need a bandage unless otherwise instructed. Do not apply any ointments or creams to your incision. You may have skin glue on your incision. Do not peel it off. It will come off on its own in about one week. Your arm may swell a bit after surgery. To reduce swelling use pillows to elevate your arm so it is above your heart. Your doctor will tell you if you need to lightly wrap your arm with an ACE bandage. ° °Diet ° °Resume your normal diet. There are not special food restrictions following this procedure. In order to heal from your surgery, it is CRITICAL to get adequate nutrition. Your body requires vitamins, minerals, and protein. Vegetables are the best source of vitamins and minerals. Vegetables also provide the perfect balance of protein. Processed food has little nutritional value, so try to avoid this. ° °Medications ° °Resume taking all of your medications. If your incision is causing pain, you may take over-the counter pain relievers such as acetaminophen (Tylenol). If you were prescribed a stronger pain medication, please be aware these medications can cause nausea and constipation. Prevent  nausea by taking the medication with a snack or meal. Avoid constipation by drinking plenty of fluids and eating foods with high amount of fiber, such as fruits, vegetables, and grains. Do not take Tylenol if you are taking prescription pain medications. ° ° ° ° °Follow up °Your surgeon may want to see you in the office following your access surgery. If so, this will be arranged at the time of your surgery. ° °Please call us immediately for any of the following conditions: ° °Increased pain, redness, drainage (pus) from your incision site °Fever of 101 degrees or higher °Severe or worsening pain at your incision site °Hand pain or numbness. ° °Reduce your risk of vascular disease: ° °Stop smoking. If you would like help, call [facility] at 1-800-QUIT-NOW (1-800-784-8669) or [place] at 336-586-4000 ° °Manage your cholesterol °Maintain a desired weight °Control your diabetes °Keep your blood pressure down ° °Dialysis ° °It will take several weeks to several months for your new dialysis access to be ready for use. Your surgeon will determine when it is OK to use it. Your nephrologist will continue to direct your dialysis. You can continue to use your Permcath until your new access is ready for use. ° °If you have any questions, please call the office at [phone]. ° °

## 2023-04-07 NOTE — Anesthesia Preprocedure Evaluation (Addendum)
Anesthesia Evaluation  Patient identified by MRN, date of birth, ID band Patient awake    Reviewed: Allergy & Precautions, NPO status , Patient's Chart, lab work & pertinent test results  Airway Mallampati: III  TM Distance: >3 FB Neck ROM: Full    Dental no notable dental hx.    Pulmonary sleep apnea (noncompliant) , COPD, former smoker   Pulmonary exam normal        Cardiovascular hypertension, Pt. on medications and Pt. on home beta blockers + Past MI and +CHF   Rhythm:Regular Rate:Normal  Echo 11/08/21: IMPRESSIONS   1. Left ventricular ejection fraction, by estimation, is 60 to 65%. The  left ventricle has normal function. The left ventricle has no regional  wall motion abnormalities. Left ventricular diastolic parameters were  normal.   2. Right ventricular systolic function is normal. The right ventricular  size is normal. Tricuspid regurgitation signal is inadequate for assessing  PA pressure.   3. The mitral valve is grossly normal. No evidence of mitral valve  regurgitation. No evidence of mitral stenosis.   4. The aortic valve is tricuspid. There is mild calcification of the  aortic valve. Aortic valve regurgitation is not visualized. Aortic valve  sclerosis/calcification is present, without any evidence of aortic  stenosis.   5. The inferior vena cava is normal in size with greater than 50%  respiratory variability, suggesting right atrial pressure of 3 mmHg.  - Comparison(s): No significant change from prior study.     Neuro/Psych   Anxiety Depression       GI/Hepatic Neg liver ROS,GERD  Medicated,,  Endo/Other  diabetes, Poorly Controlled, Type 2, Insulin Dependent  Lab Results      Component                Value               Date                      HGBA1C                   10.5 (H)            11/25/2022             Renal/GU ESRF and DialysisRenal diseaseED for dialysis Tu/Thr/Sa 2/2 removal from all  dialysis centers for behavioral issues  negative genitourinary   Musculoskeletal  (+) Arthritis , Osteoarthritis,    Abdominal  (+) + obese  Peds  Hematology  (+) Blood dyscrasia, anemia Lab Results      Component                Value               Date                      WBC                      5.6                 04/07/2023                HGB                      8.1 (L)             04/07/2023  HCT                      24.4 (L)            04/07/2023                MCV                      88.7                04/07/2023                PLT                      105 (L)             04/07/2023             Lab Results      Component                Value               Date                      NA                       132 (L)             04/07/2023                K                        3.6                 04/07/2023                CO2                      29                  04/07/2023                GLUCOSE                  340 (H)             04/07/2023                BUN                      86 (H)              04/07/2023                CREATININE               5.45 (H)            04/07/2023                CALCIUM                  7.4 (L)             04/07/2023                GFRNONAA                 11 (L)  04/07/2023              Anesthesia Other Findings   Reproductive/Obstetrics                             Anesthesia Physical Anesthesia Plan  ASA: 4  Anesthesia Plan: MAC and Regional   Post-op Pain Management: Regional block*   Induction: Intravenous  PONV Risk Score and Plan: 1 and Propofol infusion, Treatment may vary due to age or medical condition, Ondansetron and Midazolam  Airway Management Planned: Simple Face Mask and Nasal Cannula  Additional Equipment: None  Intra-op Plan:   Post-operative Plan:   Informed Consent: I have reviewed the patients History and Physical, chart, labs and discussed the  procedure including the risks, benefits and alternatives for the proposed anesthesia with the patient or authorized representative who has indicated his/her understanding and acceptance.     Dental advisory given  Plan Discussed with: CRNA  Anesthesia Plan Comments:        Anesthesia Quick Evaluation

## 2023-04-07 NOTE — Anesthesia Procedure Notes (Addendum)
Procedure Name: MAC Date/Time: 04/07/2023 2:04 PM  Performed by: Lelon Perla, CRNAPre-anesthesia Checklist: Patient identified, Emergency Drugs available, Suction available and Patient being monitored Oxygen Delivery Method: Simple face mask Preoxygenation: Pre-oxygenation with 100% oxygen Induction Type: IV induction

## 2023-04-07 NOTE — Anesthesia Procedure Notes (Signed)
Anesthesia Regional Block: Supraclavicular block   Pre-Anesthetic Checklist: , timeout performed,  Correct Patient, Correct Site, Correct Laterality,  Correct Procedure, Correct Position, site marked,  Risks and benefits discussed,  Surgical consent,  Pre-op evaluation,  At surgeon's request and post-op pain management  Laterality: Left  Prep: Dura Prep       Needles:  Injection technique: Single-shot  Needle Type: Echogenic Stimulator Needle     Needle Length: 5cm  Needle Gauge: 20     Additional Needles:   Procedures:,,,, ultrasound used (permanent image in chart),,    Narrative:  Start time: 04/07/2023 12:08 PM End time: 04/07/2023 12:12 PM Injection made incrementally with aspirations every 5 mL.  Performed by: Personally  Anesthesiologist: Atilano Median, DO  Additional Notes: Patient identified. Risks/Benefits/Options discussed with patient including but not limited to bleeding, infection, nerve damage, failed block, incomplete pain control. Patient expressed understanding and wished to proceed. All questions were answered. Sterile technique was used throughout the entire procedure. Please see nursing notes for vital signs. Aspirated in 5cc intervals with injection for negative confirmation. Patient was given instructions on fall risk and not to get out of bed. All questions and concerns addressed with instructions to call with any issues or inadequate analgesia.

## 2023-04-07 NOTE — Interval H&P Note (Signed)
History and Physical Interval Note:  04/07/2023 12:37 PM  Jeffrey [name]  has presented today for surgery, with the diagnosis of RENAL FAILURE.  The various methods of treatment have been discussed with the patient and family. After consideration of risks, benefits and other options for treatment, the patient has consented to  Procedure(s): ARTERIOVENOUS (AV) FISTULA CREATION VS GRAFT (Left) as a surgical intervention.  The patient's history has been reviewed, patient examined, no change in status, stable for surgery.  I have reviewed the patient's chart and labs.  Questions were answered to the patient's satisfaction.     [name]

## 2023-04-08 ENCOUNTER — Encounter (HOSPITAL_COMMUNITY): Payer: Self-pay | Admitting: Vascular Surgery

## 2023-04-08 DIAGNOSIS — N186 End stage renal disease: Secondary | ICD-10-CM | POA: Diagnosis not present

## 2023-04-08 DIAGNOSIS — E1165 Type 2 diabetes mellitus with hyperglycemia: Secondary | ICD-10-CM | POA: Diagnosis not present

## 2023-04-08 LAB — RENAL FUNCTION PANEL
Albumin: 3.4 g/dL — ABNORMAL LOW (ref 3.5–5.0)
Anion gap: 14 (ref 5–15)
BUN: 52 mg/dL — ABNORMAL HIGH (ref 8–23)
CO2: 28 mmol/L (ref 22–32)
Calcium: 7.5 mg/dL — ABNORMAL LOW (ref 8.9–10.3)
Chloride: 91 mmol/L — ABNORMAL LOW (ref 98–111)
Creatinine, Ser: 4.25 mg/dL — ABNORMAL HIGH (ref 0.61–1.24)
GFR, Estimated: 15 mL/min — ABNORMAL LOW (ref >60)
Glucose, Bld: 338 mg/dL — ABNORMAL HIGH (ref 70–99)
Phosphorus: 3.9 mg/dL (ref 2.5–4.6)
Potassium: 4.1 mmol/L (ref 3.5–5.1)
Sodium: 133 mmol/L — ABNORMAL LOW (ref 135–145)

## 2023-04-08 LAB — CBC
HCT: 25.4 % — ABNORMAL LOW (ref 39.0–52.0)
Hemoglobin: 8 g/dL — ABNORMAL LOW (ref 13.0–17.0)
MCH: 28.1 pg (ref 26.0–34.0)
MCHC: 31.5 g/dL (ref 30.0–36.0)
MCV: 89.1 fL (ref 80.0–100.0)
Platelets: 104 10*3/uL — ABNORMAL LOW (ref 150–400)
RBC: 2.85 MIL/uL — ABNORMAL LOW (ref 4.22–5.81)
RDW: 14.5 % (ref 11.5–15.5)
WBC: 7.5 10*3/uL (ref 4.0–10.5)
nRBC: 0 % (ref 0.0–0.2)

## 2023-04-08 LAB — HEPATITIS B SURFACE ANTIBODY, QUANTITATIVE: Hep B S AB Quant (Post): 32.4 m[IU]/mL (ref >9.9)

## 2023-04-08 LAB — GLUCOSE, CAPILLARY: Glucose-Capillary: 472 mg/dL — ABNORMAL HIGH (ref 70–99)

## 2023-04-08 MED ORDER — CALCIUM CARBONATE ANTACID 1250 MG/5ML PO SUSP: 500.0000 mg | Freq: Four times a day (QID) | ORAL | 2 refills | Status: DC | PRN

## 2023-04-08 NOTE — Progress Notes (Signed)
  Progress Note    04/08/2023 8:08 AM 1 Day Post-Op  Subjective: No new complaints, wants to go home  Vitals:   04/08/23 0456 04/08/23 0510  BP:  116/68  Pulse:    Resp:    Temp: 97.9 F (36.6 C) 97.9 F (36.6 C)  SpO2:      Physical Exam: Awake alert oriented Mild bruising around the left IJ tunneled dialysis catheter site with saturation of the dressing but no hematoma Very strong thrill left upper arm and left hand is warm and well-perfused  CBC    Component Value Date/Time   WBC 5.6 04/07/2023 0306   RBC 2.75 (L) 04/07/2023 0306   HGB 8.1 (L) 04/07/2023 0306   HCT 24.4 (L) 04/07/2023 0306   PLT 105 (L) 04/07/2023 0306   MCV 88.7 04/07/2023 0306   MCH 29.5 04/07/2023 0306   MCHC 33.2 04/07/2023 0306   RDW 14.5 04/07/2023 0306   LYMPHSABS 0.7 03/16/2023 0818   MONOABS 0.4 03/16/2023 0818   EOSABS 0.1 03/16/2023 0818   BASOSABS 0.0 03/16/2023 0818    BMET    Component Value Date/Time   NA 132 (L) 04/07/2023 0306   K 3.6 04/07/2023 0306   CL 91 (L) 04/07/2023 0306   CO2 29 04/07/2023 0306   GLUCOSE 340 (H) 04/07/2023 0306   BUN 86 (H) 04/07/2023 0306   CREATININE 5.45 (H) 04/07/2023 0306   CALCIUM 7.4 (L) 04/07/2023 0306   GFRNONAA 11 (L) 04/07/2023 0306    INR    Component Value Date/Time   INR 1.1 10/19/2022 2253     Intake/Output Summary (Last 24 hours) at 04/08/2023 1610 Last data filed at 04/08/2023 0510 Gross per 24 hour  Intake --  Output 2400 ml  Net -2400 ml     Assessment/plan:  63 y.o. male is status post left upper arm AV graft placement with a strong thrill and replacement of tunneled dialysis catheter.  Patient is okay to use catheter for dialysis and graft can be used in 4 weeks.  He can follow-up with [place] on an as-needed basis.     Migdalia Olejniczak C. Randie Heinz, MD [facility] [place] Office: [phone] Pager: [phone]  04/08/2023 8:08 AM
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
## 2023-04-08 NOTE — Progress Notes (Signed)
[facility] Progress Note   62 y.o. male with PMH including ESRD on dialysis, CHF, COPD, PTSH, sleep apnea, T2DM who presented today for AVF vs AVG and TDC exchange. Unfortunately, patient's blood sugar was too high and procedure was cancelled. Current plan is admission overnight and surgery once blood sugar is under control. Nephrology has been consulted for management of ESRD.   Dialysis Orders: Last OP HD orders (dec 2023):  3:15h  129kg  RUE AVG   Heparin 2000  Currently no outpatient HD center   Assessment/ Plan:    Hyperglycemia: Presented for surgery with glucose >500. He is asymptomatic. Insulin per admitting team Dialysis access issue: Has been having issues with [facility]  over the past 1-2 weeks, was scheduled for permanent access and TDC exchange but surgery was cancelled due to hyperglycemia.  Appreciate Dr. Durwin Nora placing a LUA AVG 4-7 tapered inflow on 5/10 with good bruit, LIJ TC exchanged from 23 -> 28cm CTT.  HD 5/11 (came off very early this am and didn't start till ~0100) w/ successful 2L net UF.   ESRD:  On TTS schedule, was discharged from his outpatient dialysis unit due to behavioral issues and unfortunately has not been accepted at another unit yet, so receiving dialysis through the ED. HD today, see above.  Hypertension/volume: BP elevated and has edema on exam. UFG 3L today as tolerated.   Anemia: Hgb 8.1 - unclear when he last received aranesp, has been refusing ESA or iron.  Metabolic bone disease: Will check RFP with HD, not on VDRA. Continue calcium acetate.   Nutrition:  Currently NPO  Subjective:   Denies sob/f/c/n/v.   Objective:   BP (!) 133/42 (BP Location: Right Leg)   Pulse (!) 104   Temp 99.6 F (37.6 C) (Oral)   Resp 17   Ht 5\' 5"  (1.651 m)   Wt (!) 140.9 kg   SpO2 98%   BMI 51.69 kg/m   Intake/Output Summary (Last 24 hours) at 04/08/2023 1139 Last data filed at 04/08/2023 0510 Gross per 24 hour  Intake --  Output 2000 ml   Net -2000 ml   Weight change: 1 kg  Physical Exam: General: Alert male in NAD Head: NCAT Lungs: CTA b/l on 3L [place] Heart: RRR with normal S1, S2.  Abdomen: Soft, non-tender, non-distended obese Musculoskeletal:  Strength and tone appear normal for age. Lower extremities: 1+ pitting edema b/l lower extremities Neuro: Alert and oriented X 3. Moves all extremities spontaneously. Psych:  Responds to questions appropriately with a normal affect. Dialysis Access: LIJ TDC, Lt AVG good bruit  Imaging: DG CHEST PORT 1 VIEW  Result Date: 04/07/2023 CLINICAL DATA:  Check dialysis catheter placement EXAM: PORTABLE CHEST 1 VIEW COMPARISON:  03/03/2023 FINDINGS: Dialysis catheter is noted in satisfactory position on the left. Cardiac shadow is mildly prominent. Lungs are clear. No pneumothorax is noted. No bony abnormality is noted. Vascular stent is noted on the right stable from the prior exam. IMPRESSION: No acute abnormality noted. Electronically Signed   By: Alcide Clever M.D.   On: 04/07/2023 21:07   DG C-Arm 1-60 Min-No Report  Result Date: 04/07/2023 Fluoroscopy was utilized by the requesting physician.  No radiographic interpretation.    Labs: BMET Recent Labs  Lab 04/04/23 1140 04/06/23 0713 04/07/23 0306 04/08/23 0750  NA 134* 132* 132* 133*  K 3.7 4.1 3.6 4.1  CL 88* 91* 91* 91*  CO2 29  --  29 28  GLUCOSE 263* 576* 340* 338*  BUN 110* 96* 86* 52*  CREATININE 6.41* 6.30* 5.45* 4.25*  CALCIUM 7.8*  --  7.4* 7.5*  PHOS 5.7*  --  5.6* 3.9   CBC Recent Labs  Lab 04/04/23 1154 04/06/23 0713 04/07/23 0306 04/08/23 0750  WBC 6.5  --  5.6 7.5  HGB 8.6* 9.2* 8.1* 8.0*  HCT 26.8* 27.0* 24.4* 25.4*  MCV 89.3  --  88.7 89.1  PLT 130*  --  105* 104*    Medications:     amLODipine  10 mg Oral q morning   aspirin EC  81 mg Oral q AM   atorvastatin  40 mg Oral q AM   calcium acetate  1,334 mg Oral TID with meals   carvedilol  12.5 mg Oral BID WC   Chlorhexidine Gluconate  Cloth  6 each Topical Q0600   Chlorhexidine Gluconate Cloth  6 each Topical Q0600   Chlorhexidine Gluconate Cloth  6 each Topical Q0600   clopidogrel  75 mg Oral Daily   darbepoetin (ARANESP) injection - DIALYSIS  60 mcg Subcutaneous Q Fri-1800   heparin  5,000 Units Subcutaneous Q8H   insulin aspart  0-5 Units Subcutaneous QHS   insulin aspart  0-6 Units Subcutaneous TID WC   insulin regular human CONCENTRATED  70 Units Subcutaneous BID WC   metolazone  10 mg Oral Daily   mupirocin ointment  1 Application Nasal BID   nortriptyline  20 mg Oral QHS   pantoprazole  40 mg Oral QAC breakfast   pregabalin  75 mg Oral QHS   sertraline  100 mg Oral q AM   tamsulosin  0.4 mg Oral Daily   torsemide  120 mg Oral BID   traZODone  150 mg Oral QHS      Paulene Floor, MD 04/08/2023, 11:39 AM

## 2023-04-10 LAB — GLUCOSE, CAPILLARY: Glucose-Capillary: 205 mg/dL — ABNORMAL HIGH (ref 70–99)

## 2023-04-11 ENCOUNTER — Emergency Department (HOSPITAL_COMMUNITY)
Admission: EM | Admit: 2023-04-11 | Discharge: 2023-04-11 | Disposition: A | Discharge status: Home / Self Care | Payer: 59 | Attending: Emergency Medicine | Admitting: Emergency Medicine

## 2023-04-11 ENCOUNTER — Other Ambulatory Visit: Payer: Self-pay

## 2023-04-11 ENCOUNTER — Encounter (HOSPITAL_COMMUNITY): Payer: Self-pay

## 2023-04-11 DIAGNOSIS — Z79899 Other long term (current) drug therapy: Secondary | ICD-10-CM | POA: Diagnosis not present

## 2023-04-11 DIAGNOSIS — N186 End stage renal disease: Secondary | ICD-10-CM

## 2023-04-11 DIAGNOSIS — Z7902 Long term (current) use of antithrombotics/antiplatelets: Secondary | ICD-10-CM | POA: Diagnosis not present

## 2023-04-11 DIAGNOSIS — D631 Anemia in chronic kidney disease: Secondary | ICD-10-CM | POA: Insufficient documentation

## 2023-04-11 DIAGNOSIS — Z794 Long term (current) use of insulin: Secondary | ICD-10-CM | POA: Insufficient documentation

## 2023-04-11 DIAGNOSIS — Z992 Dependence on renal dialysis: Secondary | ICD-10-CM | POA: Diagnosis present

## 2023-04-11 DIAGNOSIS — Z7982 Long term (current) use of aspirin: Secondary | ICD-10-CM | POA: Insufficient documentation

## 2023-04-11 LAB — RENAL FUNCTION PANEL
Albumin: 3.2 g/dL — ABNORMAL LOW (ref 3.5–5.0)
Anion gap: 17 — ABNORMAL HIGH (ref 5–15)
BUN: 100 mg/dL — ABNORMAL HIGH (ref 8–23)
CO2: 27 mmol/L (ref 22–32)
Calcium: 7.7 mg/dL — ABNORMAL LOW (ref 8.9–10.3)
Chloride: 90 mmol/L — ABNORMAL LOW (ref 98–111)
Creatinine, Ser: 6.5 mg/dL — ABNORMAL HIGH (ref 0.61–1.24)
GFR, Estimated: 9 mL/min — ABNORMAL LOW (ref >60)
Glucose, Bld: 315 mg/dL — ABNORMAL HIGH (ref 70–99)
Phosphorus: 4.9 mg/dL — ABNORMAL HIGH (ref 2.5–4.6)
Potassium: 3.4 mmol/L — ABNORMAL LOW (ref 3.5–5.1)
Sodium: 134 mmol/L — ABNORMAL LOW (ref 135–145)

## 2023-04-11 LAB — CBC WITH DIFFERENTIAL/PLATELET
Abs Immature Granulocytes: 0.11 10*3/uL — ABNORMAL HIGH (ref 0.00–0.07)
Basophils Absolute: 0 10*3/uL (ref 0.0–0.1)
Basophils Relative: 0 %
Eosinophils Absolute: 0.2 10*3/uL (ref 0.0–0.5)
Eosinophils Relative: 3 %
HCT: 23.2 % — ABNORMAL LOW (ref 39.0–52.0)
Hemoglobin: 7.2 g/dL — ABNORMAL LOW (ref 13.0–17.0)
Immature Granulocytes: 1 %
Lymphocytes Relative: 10 %
Lymphs Abs: 0.8 10*3/uL (ref 0.7–4.0)
MCH: 28.2 pg (ref 26.0–34.0)
MCHC: 31 g/dL (ref 30.0–36.0)
MCV: 91 fL (ref 80.0–100.0)
Monocytes Absolute: 0.7 10*3/uL (ref 0.1–1.0)
Monocytes Relative: 8 %
Neutro Abs: 6.2 10*3/uL (ref 1.7–7.7)
Neutrophils Relative %: 78 %
Platelets: 113 10*3/uL — ABNORMAL LOW (ref 150–400)
RBC: 2.55 MIL/uL — ABNORMAL LOW (ref 4.22–5.81)
RDW: 14.4 % (ref 11.5–15.5)
WBC: 8.1 10*3/uL (ref 4.0–10.5)
nRBC: 0 % (ref 0.0–0.2)

## 2023-04-11 LAB — IRON AND TIBC
Iron: 26 ug/dL — ABNORMAL LOW (ref 45–182)
Saturation Ratios: 11 % — ABNORMAL LOW (ref 17.9–39.5)
TIBC: 230 ug/dL — ABNORMAL LOW (ref 250–450)
UIBC: 204 ug/dL

## 2023-04-11 LAB — FERRITIN: Ferritin: 545 ng/mL — ABNORMAL HIGH (ref 24–336)

## 2023-04-11 MED ORDER — DARBEPOETIN ALFA 100 MCG/0.5ML IJ SOSY
100.0000 ug | PREFILLED_SYRINGE | Freq: Once | INTRAMUSCULAR | Status: AC
  Administered 2023-04-11: 100 ug via SUBCUTANEOUS
  Filled 2023-04-11 (×2): qty 0.5

## 2023-04-11 MED ORDER — CHLORHEXIDINE GLUCONATE CLOTH 2 % EX PADS: 6.0000 | MEDICATED_PAD | Freq: Every day | CUTANEOUS | Status: DC

## 2023-04-11 MED ORDER — HEPARIN SODIUM (PORCINE) 1000 UNIT/ML IJ SOLN
INTRAMUSCULAR | Status: AC
  Administered 2023-04-11: 2000 [IU]
  Filled 2023-04-11: qty 2

## 2023-04-11 MED ORDER — HEPARIN SODIUM (PORCINE) 1000 UNIT/ML IJ SOLN
INTRAMUSCULAR | Status: AC
  Administered 2023-04-11: 4200 [IU]
  Filled 2023-04-11: qty 5

## 2023-04-11 NOTE — Progress Notes (Signed)
   04/11/23 1641  Vitals  BP (!) 134/118  Pulse Rate 92  Resp 15  MEWS COLOR  MEWS Score Color Green  Oxygen Therapy  SpO2 100 %  O2 Device Nasal Cannula  O2 Flow Rate (L/min) 3 L/min  Patient Activity (if Appropriate) In bed  Pulse Oximetry Type Continuous  MEWS Score  MEWS Temp 0  MEWS Systolic 0  MEWS Pulse 0  MEWS RR 0  MEWS LOC 0  MEWS Score 0   Received patient in bed to unit.  Alert and oriented.  Informed consent signed and in chart.   TX duration:2:75  Patient tolerated well.  Transported back to the room  Alert, without acute distress.  Hand-off given to patient's nurse.   Access used: Yes Access issues: No  Total UF removed: 1500 Medication(s) given: Heparin [place] 2.1cc both HD ports Post HD VS: See Above Grid Post HD weight: Non Bed Scale   [facility]

## 2023-04-11 NOTE — ED Provider Notes (Signed)
Pt finished his dialysis session and is back in the ED for a re-eval.  He is feeling well and is ready to go home.  He is stable for d/c.  Return if worse.   Jacalyn Lefevre, MD 04/11/23 [phone]

## 2023-04-11 NOTE — ED Provider Notes (Signed)
[place] EMERGENCY DEPARTMENT AT [facility] Provider Note   CSN: [id number] Arrival date & time: 04/11/23  7829     History  Chief Complaint  Patient presents with   needs dialysis    Jeffrey [name] is a 62 y.o. male with a past medical history significant for ESRD who presents to the ED requesting dialysis.  Last dialysis session was 5/11.  Patient has been discharged from his regular dialysis center and comes to the ED Tuesday, Thursday, and Saturday for dialysis.  Patient is currently asymptomatic.  Patient had a left IJ catheter and AV graft on 5/10 by Dr. Edilia Bo. On chronic 3L [place], no change from baseline.  Denies chest pain, shortness of breath, lower extremity edema, abdominal pain, nausea, vomiting. No fever or chills.   History obtained from patient and past medical records. No interpreter used during encounter.       Home Medications Prior to Admission medications   Medication Sig Start Date End Date Taking? Authorizing Provider  amLODipine (NORVASC) 10 MG tablet Take 10 mg by mouth every morning. 04/17/20   [provider]  ASPIRIN LOW DOSE 81 MG EC tablet Take 81 mg by mouth in the morning. 02/22/21   [provider]  atorvastatin (LIPITOR) 40 MG tablet Take 40 mg by mouth in the morning. 02/09/21   [provider]  calcium acetate (PHOSLO) 667 MG capsule Take 2 capsules (1,334 mg total) by mouth with breakfast, with lunch, and with evening meal. 04/10/21   Calvert Cantor, MD  Calcium Carbonate Antacid (CALCIUM CARBONATE, DOSED IN MG ELEMENTAL CALCIUM,) 1250 MG/5ML SUSP Take 5 mLs (500 mg of elemental calcium total) by mouth every 6 (six) hours as needed for indigestion. 04/08/23   Rhetta Mura, MD  carvedilol (COREG) 12.5 MG tablet Take 12.5 mg by mouth 2 (two) times daily with a meal. 04/17/20   [provider]  Cholecalciferol (VITAMIN D) 50 MCG (2000 UT) CAPS Take 2,000 Units by mouth daily.    [provider]   clopidogrel (PLAVIX) 75 MG tablet Take 1 tablet (75 mg total) by mouth daily. 11/02/22   Rhyne, Ames Coupe, PA-C  cyclobenzaprine (FLEXERIL) 10 MG tablet Take 10 mg by mouth 3 (three) times daily as needed for muscle spasms. 12/29/21   [provider]  diazepam (VALIUM) 5 MG tablet Take 5 mg by mouth 2 (two) times daily as needed for muscle spasms. 12/09/21   [provider]  insulin regular human CONCENTRATED (HUMULIN R U-500 KWIKPEN) 500 UNIT/ML KwikPen Inject 45 Units into the skin daily with supper. Patient taking differently: Inject 70-125 Units into the skin 2 (two) times daily with a meal. 09/04/21 08/30/24  [name], MD  metolazone (ZAROXOLYN) 10 MG tablet Take 10 mg by mouth daily. 07/13/21   [provider]  nortriptyline (PAMELOR) 10 MG capsule Take 20 mg by mouth at bedtime. 10/23/19 08/30/24  [provider]  oxyCODONE (ROXICODONE) 5 MG immediate release tablet Take 1 tablet (5 mg total) by mouth every 8 (eight) hours as needed. Patient taking differently: Take 5 mg by mouth every 8 (eight) hours as needed for moderate pain. 12/31/22 12/31/23  [name], [name], DO  pantoprazole (PROTONIX) 40 MG tablet Take 40 mg by mouth daily before breakfast. 02/09/21   [provider]  pregabalin (LYRICA) 75 MG capsule Take 1 capsule (75 mg total) by mouth See admin instructions. Daily.  Give after dialysis on dialysis days. 01/21/23   [name],  MD  sertraline (ZOLOFT) 100 MG tablet Take 100 mg by mouth in the morning. 02/22/21   [provider]  tamsulosin (FLOMAX) 0.4 MG CAPS capsule Take 1 capsule (0.4 mg total) by mouth daily. 11/10/21   [name], MD  torsemide (DEMADEX) 20 MG tablet Take 120 mg by mouth 2 (two) times daily.    [provider]  traZODone (DESYREL) 150 MG tablet Take 150 mg by mouth at bedtime. 12/09/21   [provider]      Allergies    Actos [pioglitazone], Dexmedetomidine, Ibuprofen, Tomato, and  Wellbutrin [bupropion]    Review of Systems   Review of Systems  Constitutional:  Negative for fever.  Respiratory:  Negative for shortness of breath.   Cardiovascular:  Negative for chest pain.  Gastrointestinal:  Negative for abdominal pain.    Physical Exam Updated Vital Signs BP (!) 141/44   Pulse 94   Temp 98.3 F (36.8 C) (Oral)   Resp 16   Ht 5\' 5"  (1.651 m)   Wt (!) 140.9 kg   SpO2 99%   BMI 51.69 kg/m  Physical Exam Vitals and nursing note reviewed.  Constitutional:      General: He is not in acute distress.    Appearance: He is not ill-appearing.  HENT:     Head: Normocephalic.  Eyes:     Pupils: Pupils are equal, round, and reactive to light.  Cardiovascular:     Rate and Rhythm: Normal rate and regular rhythm.     Pulses: Normal pulses.     Heart sounds: Normal heart sounds. No murmur heard.    No friction rub. No gallop.  Pulmonary:     Effort: Pulmonary effort is normal.     Breath sounds: Normal breath sounds.  Abdominal:     General: Abdomen is flat. There is no distension.     Palpations: Abdomen is soft.     Tenderness: There is no abdominal tenderness. There is no guarding or rebound.  Musculoskeletal:        General: Normal range of motion.     Cervical back: Neck supple.  Skin:    General: Skin is warm and dry.  Neurological:     General: No focal deficit present.     Mental Status: He is alert.  Psychiatric:        Mood and Affect: Mood normal.        Behavior: Behavior normal.     ED Results / Procedures / Treatments   Labs (all labs ordered are listed, but only abnormal results are displayed) Labs Reviewed  CBC WITH DIFFERENTIAL/PLATELET  RENAL FUNCTION PANEL    EKG None  Radiology No results found.  Procedures Procedures    Medications Ordered in ED Medications - No data to display  ED Course/ Medical Decision Making/ A&P                             Medical Decision Making Amount and/or Complexity of Data  Reviewed External Data Reviewed: notes.    Details: Recent admission/OP note Labs: ordered.   This patient presents to the ED for concern of dialysis, this involves an extensive number of treatment options, and is a complaint that carries with it a high risk of complications and morbidity.  The differential diagnosis includes normal dialysis, hyperkalemia, fluid overload, etc  62 year old male with history of ESRD presents to the ED requesting dialysis.  Patient has been  discharge from his dialysis center comes to the ED for dialysis.  Last dialysis was 5/11. Patient had a left IJ catheter and AV graft on 5/10 by Dr. Edilia Bo.  Patient is currently asymptomatic with no complaints. Stable vitals. Patient in no acute distress. Routine labs ordered. Will discuss with nephrology for dialysis.   7:56 AM Discussed with Dr. Arlean Hopping with nephrology who will arrange dialysis. Patient is a hard stick, labs can be drawn in dialysis per Dr. Arlean Hopping. Anticipate discharge after dialysis.   Has PCP       Final Clinical Impression(s) / ED Diagnoses Final diagnoses:  ESRD (end stage renal disease) [facility])    Rx / DC Orders ED Discharge Orders     None         Jesusita Oka 04/11/23 0759    Ernie Avena, MD 04/11/23 [phone]

## 2023-04-11 NOTE — ED Notes (Signed)
Pt transported to HD.

## 2023-04-11 NOTE — Discharge Instructions (Signed)
Please return for your schedule dialysis.

## 2023-04-11 NOTE — Procedures (Signed)
We are asked to see this patient for hospital dialysis. Pt was discharged from his OP HD unit in December 2023 due to behavioral issues. Pt came to ED today requesting hospital dialysis. Plan is for HD upstairs then will return to ED for reassessment and expected discharge home.     Last OP HD orders (dec 2023):  3:15h  129kg  RUE AVG   Heparin 2000   - last hep B labs here were from 04/07/23  If ordering darbepoeitin for this patient, do not use the prepared order of "SQ at 1800" as this patient has to leave the hospital premises by 5 pm to catch his ride home.    Pt is due for esa, will increase darbe to 100 mcg q 2 wks and give 1st dose today. Also will get iron studies today.     Date             Hb  Tsat/ ferritin     IV Fe    Darbe SQ 12/02/22            11.0 22%/ 935 01/24/23           9.0 31%/ 1056  02/16/23 8.8                                                        03/14/23            8.1                                                                       03/16/23            8.5          60 mcg 03/30/23            8.0                                             60 mcg  04/08/23  8.0  04/11/23 8.1       100 mcg ordered  Vinson Moselle, MD 04/11/2023, 3:11 PM  Recent Labs  Lab 04/07/23 0306 04/08/23 0750 04/11/23 1440  HGB 8.1* 8.0* 7.2*  ALBUMIN 3.5 3.4*  --   CALCIUM 7.4* 7.5*  --   PHOS 5.6* 3.9  --   CREATININE 5.45* 4.25*  --   K 3.6 4.1  --     Inpatient medications:  Chlorhexidine Gluconate Cloth  6 each Topical Q0600   darbepoetin (ARANESP) injection - DIALYSIS  100 mcg Subcutaneous Once in dialysis

## 2023-04-11 NOTE — ED Triage Notes (Signed)
Pt here to get dialysis. Pt denies any complaints 

## 2023-04-13 ENCOUNTER — Emergency Department (HOSPITAL_COMMUNITY)
Admission: EM | Admit: 2023-04-13 | Discharge: 2023-04-15 | Disposition: A | Discharge status: Home / Self Care | Payer: 59 | Attending: Emergency Medicine | Admitting: Emergency Medicine

## 2023-04-13 ENCOUNTER — Other Ambulatory Visit: Payer: Self-pay

## 2023-04-13 ENCOUNTER — Encounter (HOSPITAL_COMMUNITY): Payer: Self-pay | Admitting: *Deleted

## 2023-04-13 DIAGNOSIS — Z794 Long term (current) use of insulin: Secondary | ICD-10-CM | POA: Insufficient documentation

## 2023-04-13 DIAGNOSIS — I509 Heart failure, unspecified: Secondary | ICD-10-CM | POA: Insufficient documentation

## 2023-04-13 DIAGNOSIS — J449 Chronic obstructive pulmonary disease, unspecified: Secondary | ICD-10-CM | POA: Insufficient documentation

## 2023-04-13 DIAGNOSIS — I132 Hypertensive heart and chronic kidney disease with heart failure and with stage 5 chronic kidney disease, or end stage renal disease: Secondary | ICD-10-CM | POA: Diagnosis not present

## 2023-04-13 DIAGNOSIS — Z7902 Long term (current) use of antithrombotics/antiplatelets: Secondary | ICD-10-CM | POA: Diagnosis not present

## 2023-04-13 DIAGNOSIS — N186 End stage renal disease: Secondary | ICD-10-CM | POA: Diagnosis not present

## 2023-04-13 DIAGNOSIS — Z7982 Long term (current) use of aspirin: Secondary | ICD-10-CM | POA: Diagnosis not present

## 2023-04-13 DIAGNOSIS — E1122 Type 2 diabetes mellitus with diabetic chronic kidney disease: Secondary | ICD-10-CM | POA: Insufficient documentation

## 2023-04-13 DIAGNOSIS — Z4931 Encounter for adequacy testing for hemodialysis: Secondary | ICD-10-CM | POA: Diagnosis present

## 2023-04-13 DIAGNOSIS — Z992 Dependence on renal dialysis: Secondary | ICD-10-CM | POA: Diagnosis not present

## 2023-04-13 DIAGNOSIS — Z79899 Other long term (current) drug therapy: Secondary | ICD-10-CM | POA: Diagnosis not present

## 2023-04-13 MED ORDER — HEPARIN SODIUM (PORCINE) 1000 UNIT/ML DIALYSIS
2000.0000 [IU] | Freq: Once | INTRAMUSCULAR | Status: AC
  Administered 2023-04-13: 2000 [IU] via INTRAVENOUS_CENTRAL
  Filled 2023-04-13: qty 2

## 2023-04-13 MED ORDER — CHLORHEXIDINE GLUCONATE CLOTH 2 % EX PADS: 6.0000 | MEDICATED_PAD | Freq: Every day | CUTANEOUS | Status: DC

## 2023-04-13 MED ORDER — HEPARIN SODIUM (PORCINE) 1000 UNIT/ML IJ SOLN
4200.0000 [IU] | Freq: Once | INTRAMUSCULAR | Status: AC
  Administered 2023-04-13: 4200 [IU]

## 2023-04-13 MED ORDER — HEPARIN SODIUM (PORCINE) 1000 UNIT/ML IJ SOLN
INTRAMUSCULAR | Status: AC
  Filled 2023-04-13: qty 5

## 2023-04-13 NOTE — ED Provider Notes (Signed)
[place] EMERGENCY DEPARTMENT AT [facility] Provider Note   CSN: [id number] Arrival date & time: 04/13/23  4098     History  No chief complaint on file.   Jeffrey [name] is a 62 y.o. male  with past medical history significant for ESRD on HD, DM, HTN, CHF, COPD presents to the ED for dialysis.  Patient comes to ED requesting dialysis on T/Th/Sat.  His last treatment was on Tuesday.  Patient states he "feels a little fat" but has no other complaints.  Denies chest pain, shortness of breath, abdominal pain, nausea, vomiting, palpitations.  Patient has left IJ catheter and AV graft.  He is on 3L [place], no change from baseline.     Home Medications Prior to Admission medications   Medication Sig Start Date End Date Taking? Authorizing Provider  amLODipine (NORVASC) 10 MG tablet Take 10 mg by mouth every morning. 04/17/20   [provider]  ASPIRIN LOW DOSE 81 MG EC tablet Take 81 mg by mouth in the morning. 02/22/21   [provider]  atorvastatin (LIPITOR) 40 MG tablet Take 40 mg by mouth in the morning. 02/09/21   [provider]  calcium acetate (PHOSLO) 667 MG capsule Take 2 capsules (1,334 mg total) by mouth with breakfast, with lunch, and with evening meal. 04/10/21   Calvert Cantor, MD  Calcium Carbonate Antacid (CALCIUM CARBONATE, DOSED IN MG ELEMENTAL CALCIUM,) 1250 MG/5ML SUSP Take 5 mLs (500 mg of elemental calcium total) by mouth every 6 (six) hours as needed for indigestion. 04/08/23   Rhetta Mura, MD  carvedilol (COREG) 12.5 MG tablet Take 12.5 mg by mouth 2 (two) times daily with a meal. 04/17/20   [provider]  Cholecalciferol (VITAMIN D) 50 MCG (2000 UT) CAPS Take 2,000 Units by mouth daily.    [provider]  clopidogrel (PLAVIX) 75 MG tablet Take 1 tablet (75 mg total) by mouth daily. 11/02/22   Rhyne, Ames Coupe, PA-C  cyclobenzaprine (FLEXERIL) 10 MG tablet Take 10 mg by mouth 3 (three) times daily as needed for  muscle spasms. 12/29/21   [provider]  diazepam (VALIUM) 5 MG tablet Take 5 mg by mouth 2 (two) times daily as needed for muscle spasms. 12/09/21   [provider]  insulin regular human CONCENTRATED (HUMULIN R U-500 KWIKPEN) 500 UNIT/ML KwikPen Inject 45 Units into the skin daily with supper. Patient taking differently: Inject 70-125 Units into the skin 2 (two) times daily with a meal. 09/04/21 08/30/24  [name], MD  metolazone (ZAROXOLYN) 10 MG tablet Take 10 mg by mouth daily. 07/13/21   [provider]  nortriptyline (PAMELOR) 10 MG capsule Take 20 mg by mouth at bedtime. 10/23/19 08/30/24  [provider]  oxyCODONE (ROXICODONE) 5 MG immediate release tablet Take 1 tablet (5 mg total) by mouth every 8 (eight) hours as needed. Patient taking differently: Take 5 mg by mouth every 8 (eight) hours as needed for moderate pain. 12/31/22 12/31/23  [name], [name], DO  pantoprazole (PROTONIX) 40 MG tablet Take 40 mg by mouth daily before breakfast. 02/09/21   [provider]  pregabalin (LYRICA) 75 MG capsule Take 1 capsule (75 mg total) by mouth See admin instructions. Daily.  Give after dialysis on dialysis days. 01/21/23   [name], MD  sertraline (ZOLOFT) 100 MG tablet Take 100 mg by mouth in the morning. 02/22/21   [provider]  tamsulosin (FLOMAX) 0.4 MG CAPS capsule Take 1 capsule (  0.4 mg total) by mouth daily. 11/10/21   [name], MD  torsemide (DEMADEX) 20 MG tablet Take 120 mg by mouth 2 (two) times daily.    [provider]  traZODone (DESYREL) 150 MG tablet Take 150 mg by mouth at bedtime. 12/09/21   [provider]      Allergies    Actos [pioglitazone], Dexmedetomidine, Ibuprofen, Tomato, and Wellbutrin [bupropion]    Review of Systems   Review of Systems  Respiratory:  Negative for chest tightness and shortness of breath.   Cardiovascular:  Negative for chest pain, palpitations and leg swelling.   Gastrointestinal:  Negative for abdominal distention, diarrhea, nausea and vomiting.    Physical Exam Updated Vital Signs BP 137/81   Pulse 81   Temp (!) 97.4 F (36.3 C)   Resp 20   SpO2 99%  Physical Exam  ED Results / Procedures / Treatments   Labs (all labs ordered are listed, but only abnormal results are displayed) Labs Reviewed  CBC WITH DIFFERENTIAL/PLATELET  RENAL FUNCTION PANEL    EKG None  Radiology No results found.  Procedures Procedures    Medications Ordered in ED Medications  Chlorhexidine Gluconate Cloth 2 % PADS 6 each (has no administration in time range)  heparin sodium (porcine) 1000 UNIT/ML injection (has no administration in time range)  heparin injection 2,000 Units (2,000 Units Dialysis Given 04/13/23 1035)  heparin sodium (porcine) injection 4,200 Units (4,200 Units Intracatheter Given 04/13/23 1424)    ED Course/ Medical Decision Making/ A&P Clinical Course as of 04/13/23 1549  Thu Apr 13, 2023  1548 S, getting HD, follow up labs and reassess, DC after [JD]    Clinical Course User Index [JD] Fulton Reek, MD                             Medical Decision Making  Patient presents to the ED requesting dialysis.  Patient with history of ESRD on HD who comes to ED T/Th/Sat for dialysis.  Patient is currently asymptomatic, but does state he "feels a little fat".    On initial assessment, patient is resting comfortably in bed eating [employer] breakfast.  He is on 3L [place] at baseline.  Pulmonary effort is normal.  Stable vitals.  Patient is not in acute distress.    Contacted Dr. Arlean Hopping via secure chat to arrange for dialysis for patient.    Patient will have blood work done at dialysis unit.  Patient to be dialyzed and reassessed following treatment.  Anticipate discharge after dialysis.  Oncoming provider made aware and will reassess patient.          Final Clinical Impression(s) / ED Diagnoses Final diagnoses:  ESRD on  hemodialysis [facility])    Rx / DC Orders ED Discharge Orders     None         Lenard Simmer, PA-C 04/13/23 1549    Virgina Norfolk, DO 04/14/23 0730

## 2023-04-13 NOTE — ED Notes (Signed)
Report given to dialysis RN

## 2023-04-13 NOTE — Progress Notes (Signed)
   04/13/23 1430  Vitals  Temp 98.1 F (36.7 C)  Pulse Rate 86  Resp 14  BP 132/68  SpO2 99 %  O2 Device Nasal Cannula  Weight  (unable to weigh---ed stretcher)  Type of Weight Post-Dialysis  Oxygen Therapy  O2 Flow Rate (L/min) 3 L/min  Patient Activity (if Appropriate) In bed  Pulse Oximetry Type Continuous  Oximetry Probe Site Changed No  Post Treatment  Dialyzer Clearance Lightly streaked  Duration of HD Treatment -hour(s) 3.25 hour(s)  Hemodialysis Intake (mL) 0 mL  Liters Processed 82  Fluid Removed (mL) 2500 mL  Tolerated HD Treatment Yes   Received patient in bed to unit.  Alert and oriented.  Informed consent signed and in chart.   TX duration:3.25  Patient tolerated well.  Transported back to the room  Alert, without acute distress.  Hand-off given to patient's nurse.   Access used: LDLC Access issues: no complications  Total UF removed: 2500 Medication(s) given: none   [facility]

## 2023-04-13 NOTE — Procedures (Addendum)
We are asked to see this patient for hospital dialysis. Pt was discharged from his OP HD unit in December 2023 due to behavioral issues. Pt came to ED today requesting hospital dialysis. Plan is for HD upstairs then will return to ED for reassessment and expected discharge home.      Last OP HD orders (dec 2023):  3:15h  129kg  RUE AVG   Heparin 2000    - last hep B labs here were from 04/07/23   If ordering darbepoeitin for this patient, do not use the prepared order of "SQ at 1800" as this patient has to leave the hospital premises by 5 pm to catch his ride home.    Tsat is low --> pharm suggests ferric gluconate 250mg  q HD x 4-8.  Plan is for 4 dose then retest.  Will try to get this accomplished during hospital HD.     Date             Hb         Tsat/ ferritin     IV Fe    Darbe SQ 12/02/22            11.0       22%/ 935 01/24/23           9.0        31%/ 1056  02/16/23            8.8                                                         03/14/23            8.1                                                                        03/16/23            8.5                                              60 mcg 03/30/23            8.0                                              60 mcg  04/08/23            8.0  04/11/23            8.1        11%/ 545                       100 mcg given     I was present at this dialysis session, have reviewed the session itself and made  appropriate changes Vinson Moselle MD [employer] pager [phone]  04/13/2023, 2:15 PM

## 2023-04-13 NOTE — ED Triage Notes (Signed)
Pt is here for HD, last treatment was Tuesday.

## 2023-04-13 NOTE — ED Notes (Signed)
Pt to dialysis.

## 2023-04-15 ENCOUNTER — Emergency Department (HOSPITAL_COMMUNITY)
Admission: EM | Admit: 2023-04-15 | Discharge: 2023-04-15 | Disposition: A | Discharge status: Home / Self Care | Payer: 59 | Source: Home / Self Care | Attending: Emergency Medicine | Admitting: Emergency Medicine

## 2023-04-15 ENCOUNTER — Encounter (HOSPITAL_COMMUNITY): Payer: Self-pay

## 2023-04-15 ENCOUNTER — Other Ambulatory Visit: Payer: Self-pay

## 2023-04-15 DIAGNOSIS — I132 Hypertensive heart and chronic kidney disease with heart failure and with stage 5 chronic kidney disease, or end stage renal disease: Secondary | ICD-10-CM | POA: Diagnosis not present

## 2023-04-15 DIAGNOSIS — Z7982 Long term (current) use of aspirin: Secondary | ICD-10-CM | POA: Insufficient documentation

## 2023-04-15 DIAGNOSIS — Z992 Dependence on renal dialysis: Secondary | ICD-10-CM | POA: Insufficient documentation

## 2023-04-15 DIAGNOSIS — N186 End stage renal disease: Secondary | ICD-10-CM | POA: Insufficient documentation

## 2023-04-15 MED ORDER — SODIUM CHLORIDE 0.9 % IV SOLN
250.0000 mg | Freq: Once | INTRAVENOUS | Status: AC
  Administered 2023-04-15: 250 mg via INTRAVENOUS
  Filled 2023-04-15: qty 20

## 2023-04-15 MED ORDER — HEPARIN SODIUM (PORCINE) 1000 UNIT/ML IJ SOLN
INTRAMUSCULAR | Status: AC
  Filled 2023-04-15: qty 5

## 2023-04-15 MED ORDER — CHLORHEXIDINE GLUCONATE CLOTH 2 % EX PADS: 6.0000 | MEDICATED_PAD | Freq: Every day | CUTANEOUS | Status: DC

## 2023-04-15 NOTE — ED Notes (Signed)
Patient returned from dialysis in stable condition, no s/s of distress, asked for his motor wheel chair to be closer, and a few minutes later he was gone. Provider notified.

## 2023-04-15 NOTE — Progress Notes (Signed)
No Labs orders Per. Lenny Pastel PA.

## 2023-04-15 NOTE — ED Provider Notes (Signed)
[place] EMERGENCY DEPARTMENT AT [facility] Provider Note   CSN: [id number] Arrival date & time: 04/15/23  0750     History  Chief Complaint  Patient presents with   needs dialysis    Jeffrey [name] is a 62 y.o. male.  HPI 62 year old male presents for his dialysis.  Today is his typical dialysis day.  He has been discharged from outpatient units.  He otherwise denies any acute complaints including no chest pain or shortness of breath.  Home Medications Prior to Admission medications   Medication Sig Start Date End Date Taking? Authorizing Provider  amLODipine (NORVASC) 10 MG tablet Take 10 mg by mouth every morning. 04/17/20   [provider]  ASPIRIN LOW DOSE 81 MG EC tablet Take 81 mg by mouth in the morning. 02/22/21   [provider]  atorvastatin (LIPITOR) 40 MG tablet Take 40 mg by mouth in the morning. 02/09/21   [provider]  calcium acetate (PHOSLO) 667 MG capsule Take 2 capsules (1,334 mg total) by mouth with breakfast, with lunch, and with evening meal. 04/10/21   Calvert Cantor, MD  Calcium Carbonate Antacid (CALCIUM CARBONATE, DOSED IN MG ELEMENTAL CALCIUM,) 1250 MG/5ML SUSP Take 5 mLs (500 mg of elemental calcium total) by mouth every 6 (six) hours as needed for indigestion. 04/08/23   Rhetta Mura, MD  carvedilol (COREG) 12.5 MG tablet Take 12.5 mg by mouth 2 (two) times daily with a meal. 04/17/20   [provider]  Cholecalciferol (VITAMIN D) 50 MCG (2000 UT) CAPS Take 2,000 Units by mouth daily.    [provider]  clopidogrel (PLAVIX) 75 MG tablet Take 1 tablet (75 mg total) by mouth daily. 11/02/22   Rhyne, Ames Coupe, PA-C  cyclobenzaprine (FLEXERIL) 10 MG tablet Take 10 mg by mouth 3 (three) times daily as needed for muscle spasms. 12/29/21   [provider]  diazepam (VALIUM) 5 MG tablet Take 5 mg by mouth 2 (two) times daily as needed for muscle spasms. 12/09/21   [provider]   insulin regular human CONCENTRATED (HUMULIN R U-500 KWIKPEN) 500 UNIT/ML KwikPen Inject 45 Units into the skin daily with supper. Patient taking differently: Inject 70-125 Units into the skin 2 (two) times daily with a meal. 09/04/21 08/30/24  [name], MD  metolazone (ZAROXOLYN) 10 MG tablet Take 10 mg by mouth daily. 07/13/21   [provider]  nortriptyline (PAMELOR) 10 MG capsule Take 20 mg by mouth at bedtime. 10/23/19 08/30/24  [provider]  oxyCODONE (ROXICODONE) 5 MG immediate release tablet Take 1 tablet (5 mg total) by mouth every 8 (eight) hours as needed. Patient taking differently: Take 5 mg by mouth every 8 (eight) hours as needed for moderate pain. 12/31/22 12/31/23  [name], [name], DO  pantoprazole (PROTONIX) 40 MG tablet Take 40 mg by mouth daily before breakfast. 02/09/21   [provider]  pregabalin (LYRICA) 75 MG capsule Take 1 capsule (75 mg total) by mouth See admin instructions. Daily.  Give after dialysis on dialysis days. 01/21/23   [name], MD  sertraline (ZOLOFT) 100 MG tablet Take 100 mg by mouth in the morning. 02/22/21   [provider]  tamsulosin (FLOMAX) 0.4 MG CAPS capsule Take 1 capsule (0.4 mg total) by mouth daily. 11/10/21   [name], MD  torsemide (DEMADEX) 20 MG tablet Take 120 mg by mouth 2 (two) times daily.    [provider]  traZODone (DESYREL) 150  MG tablet Take 150 mg by mouth at bedtime. 12/09/21   [provider]      Allergies    Actos [pioglitazone], Dexmedetomidine, Ibuprofen, Tomato, and Wellbutrin [bupropion]    Review of Systems   Review of Systems  Respiratory:  Negative for shortness of breath.   Cardiovascular:  Negative for chest pain.  Gastrointestinal:  Negative for abdominal pain.    Physical Exam Updated Vital Signs BP (!) 123/45 Comment: Triage Nurse aware  Pulse 99   Temp 98.1 F (36.7 C)   Resp 20   Ht 5\' 5"  (1.651 m)   Wt (!) 140.9 kg   SpO2 96%    BMI 51.69 kg/m  Physical Exam Vitals and nursing note reviewed.  Constitutional:      General: He is not in acute distress.    Appearance: He is well-developed. He is obese. He is not ill-appearing or diaphoretic.  HENT:     Head: Normocephalic and atraumatic.  Cardiovascular:     Rate and Rhythm: Normal rate and regular rhythm.     Heart sounds: Normal heart sounds.  Pulmonary:     Effort: Pulmonary effort is normal.     Breath sounds: Normal breath sounds. No wheezing or rales.  Skin:    General: Skin is warm and dry.  Neurological:     Mental Status: He is alert.     ED Results / Procedures / Treatments   Labs (all labs ordered are listed, but only abnormal results are displayed) Labs Reviewed - No data to display  EKG None  Radiology No results found.  Procedures Procedures    Medications Ordered in ED Medications  Chlorhexidine Gluconate Cloth 2 % PADS 6 each (has no administration in time range)  ferric gluconate (FERRLECIT) 250 mg in sodium chloride 0.9 % 250 mL IVPB (has no administration in time range)    ED Course/ Medical Decision Making/ A&P                             Medical Decision Making  Patient discussed with nephrology who will take to dialysis.        Final Clinical Impression(s) / ED Diagnoses Final diagnoses:  ESRD on dialysis [facility])    Rx / DC Orders ED Discharge Orders     None         Pricilla Loveless, MD 04/15/23 1540

## 2023-04-15 NOTE — Procedures (Signed)
We are asked to see this patient for hospital dialysis. Pt was discharged from his OP HD unit in December 2023 due to behavioral issues. Pt came to ED today requesting hospital dialysis. Plan is for HD upstairs then will return to ED for reassessment and expected discharge home.      Last OP HD orders (dec 2023):  3:15h  129kg  RUE AVG   Heparin 2000    - last hep B labs here were from 04/07/23   If ordering darbepoeitin for this patient, do not use the prepared order of "SQ at 1800" as this patient has to leave the hospital premises by 5 pm to catch his ride home.    Tsat is low --> pharm suggests ferric gluconate 250mg  q HD x 4-8 doses -- plan is for 4 doses then retest.  Will try to get this accomplished during hospital HD.     Date             Hb         Tsat/ ferritin     IV Fe nulecit          Darbe SQ 12/02/22            11.0       22%/ 935 01/24/23           9.0        31%/ 1056  02/16/23            8.8                                                         03/14/23            8.1                                                                        03/16/23            8.5                                                 60 mcg 03/30/23            8.0                                                 60 mcg  04/08/23            8.0  04/11/23            8.1        11%/ 545                          100 mcg given 04/15/23  250mg , #1              I was present at this dialysis session, have reviewed the session itself and made  appropriate changes Vinson Moselle MD [facility] pager [phone]   04/15/2023, 2:24 PM

## 2023-04-15 NOTE — ED Triage Notes (Signed)
Pt here to get dialysis. Pt denies any complaints 

## 2023-04-15 NOTE — Progress Notes (Signed)
   04/15/23 1421  Vitals  Temp 98 F (36.7 C)  Pulse Rate 91  Resp (!) 22  BP 118/80  SpO2 100 %  O2 Device Nasal Cannula  Weight (!) 136.7 kg  Type of Weight Post-Dialysis  Oxygen Therapy  O2 Flow Rate (L/min) 3 L/min  Post Treatment  Dialyzer Clearance Clear  Duration of HD Treatment -hour(s) 3.15 hour(s)  Hemodialysis Intake (mL) 250 mL  Fluid Removed (mL) 2500 mL  Tolerated HD Treatment Yes  Post-Hemodialysis Comments tolerated well goal met   Received patient in bed to unit.  Alert and oriented.  Informed consent signed and in chart.   TX duration:  Patient tolerated well.  Transported back to the room  Alert, without acute distress.  Hand-off given to patient's nurse.   Access used: L cath Access issues: none  Total UF removed: 2500 Medication(s) given: ferric gluconate 750mg  Post HD VS: see above Post HD weight: 136.7kg   [facility]

## 2023-04-18 ENCOUNTER — Emergency Department (HOSPITAL_COMMUNITY)
Admission: EM | Admit: 2023-04-18 | Discharge: 2023-04-18 | Disposition: A | Discharge status: Home / Self Care | Payer: 59 | Attending: Emergency Medicine | Admitting: Emergency Medicine

## 2023-04-18 ENCOUNTER — Emergency Department (HOSPITAL_COMMUNITY): Payer: 59

## 2023-04-18 ENCOUNTER — Other Ambulatory Visit: Payer: Self-pay

## 2023-04-18 ENCOUNTER — Encounter (HOSPITAL_COMMUNITY): Payer: Self-pay

## 2023-04-18 DIAGNOSIS — J449 Chronic obstructive pulmonary disease, unspecified: Secondary | ICD-10-CM | POA: Diagnosis not present

## 2023-04-18 DIAGNOSIS — Z7982 Long term (current) use of aspirin: Secondary | ICD-10-CM | POA: Insufficient documentation

## 2023-04-18 DIAGNOSIS — I120 Hypertensive chronic kidney disease with stage 5 chronic kidney disease or end stage renal disease: Secondary | ICD-10-CM | POA: Insufficient documentation

## 2023-04-18 DIAGNOSIS — N186 End stage renal disease: Secondary | ICD-10-CM

## 2023-04-18 DIAGNOSIS — Z992 Dependence on renal dialysis: Secondary | ICD-10-CM | POA: Insufficient documentation

## 2023-04-18 DIAGNOSIS — E1122 Type 2 diabetes mellitus with diabetic chronic kidney disease: Secondary | ICD-10-CM | POA: Insufficient documentation

## 2023-04-18 DIAGNOSIS — R0602 Shortness of breath: Secondary | ICD-10-CM | POA: Diagnosis present

## 2023-04-18 LAB — RENAL FUNCTION PANEL
Albumin: 3.3 g/dL — ABNORMAL LOW (ref 3.5–5.0)
Anion gap: 17 — ABNORMAL HIGH (ref 5–15)
BUN: 71 mg/dL — ABNORMAL HIGH (ref 8–23)
CO2: 25 mmol/L (ref 22–32)
Calcium: 8.2 mg/dL — ABNORMAL LOW (ref 8.9–10.3)
Chloride: 92 mmol/L — ABNORMAL LOW (ref 98–111)
Creatinine, Ser: 5.27 mg/dL — ABNORMAL HIGH (ref 0.61–1.24)
GFR, Estimated: 12 mL/min — ABNORMAL LOW (ref >60)
Glucose, Bld: 246 mg/dL — ABNORMAL HIGH (ref 70–99)
Phosphorus: 5.3 mg/dL — ABNORMAL HIGH (ref 2.5–4.6)
Potassium: 3.6 mmol/L (ref 3.5–5.1)
Sodium: 134 mmol/L — ABNORMAL LOW (ref 135–145)

## 2023-04-18 LAB — CBC
HCT: 24.1 % — ABNORMAL LOW (ref 39.0–52.0)
Hemoglobin: 7.5 g/dL — ABNORMAL LOW (ref 13.0–17.0)
MCH: 27.8 pg (ref 26.0–34.0)
MCHC: 31.1 g/dL (ref 30.0–36.0)
MCV: 89.3 fL (ref 80.0–100.0)
Platelets: 123 10*3/uL — ABNORMAL LOW (ref 150–400)
RBC: 2.7 MIL/uL — ABNORMAL LOW (ref 4.22–5.81)
RDW: 14.6 % (ref 11.5–15.5)
WBC: 5.8 10*3/uL (ref 4.0–10.5)
nRBC: 0 % (ref 0.0–0.2)

## 2023-04-18 LAB — TROPONIN I (HIGH SENSITIVITY): Troponin I (High Sensitivity): 13 ng/L (ref <18)

## 2023-04-18 MED ORDER — CHLORHEXIDINE GLUCONATE CLOTH 2 % EX PADS: 6.0000 | MEDICATED_PAD | Freq: Every day | CUTANEOUS | Status: DC

## 2023-04-18 MED ORDER — HEPARIN SODIUM (PORCINE) 1000 UNIT/ML DIALYSIS
2000.0000 [IU] | INTRAMUSCULAR | Status: DC | PRN
  Administered 2023-04-18: 2000 [IU] via INTRAVENOUS_CENTRAL
  Filled 2023-04-18 (×2): qty 2

## 2023-04-18 MED ORDER — ANTICOAGULANT SODIUM CITRATE 4% (200MG/5ML) IV SOLN: 5.0000 mL | Status: DC | PRN

## 2023-04-18 MED ORDER — ALTEPLASE 2 MG IJ SOLR: 2.0000 mg | Freq: Once | INTRAMUSCULAR | Status: DC | PRN

## 2023-04-18 MED ORDER — HEPARIN SODIUM (PORCINE) 1000 UNIT/ML DIALYSIS
1000.0000 [IU] | INTRAMUSCULAR | Status: DC | PRN
  Administered 2023-04-18: 1000 [IU]
  Filled 2023-04-18 (×2): qty 1

## 2023-04-18 NOTE — ED Provider Notes (Signed)
[place] EMERGENCY DEPARTMENT AT [name] Provider Note   CSN: [id number] Arrival date & time: 04/18/23  4098     History  Chief Complaint  Patient presents with   needs dialysis   Shortness of Breath    Jeffrey [name] is a 62 y.o. male.  HPI Patient presents for dialysis need.  Medical history includes HTN, ESRD, COPD, T2DM, anemia, GERD, HLD.  He has been undergoing dialysis from the ED.  Last HD session was on Saturday, 3 days ago.  Over the weekend, patient states that he has had acute on chronic shortness of breath.  He has remained on his baseline 3 L of supplemental oxygen.  He has used nebulized breathing treatments at home without improvement.  He does feel swollen.  He denies any significant changes in diet or high salt intake.  He feels that his symptoms may be secondary to stress from his wife currently being in the hospital and recently undergoing placement of an Impella device.    Home Medications Prior to Admission medications   Medication Sig Start Date End Date Taking? Authorizing Provider  amLODipine (NORVASC) 10 MG tablet Take 10 mg by mouth every morning. 04/17/20   [provider]  ASPIRIN LOW DOSE 81 MG EC tablet Take 81 mg by mouth in the morning. 02/22/21   [provider]  atorvastatin (LIPITOR) 40 MG tablet Take 40 mg by mouth in the morning. 02/09/21   [provider]  calcium acetate (PHOSLO) 667 MG capsule Take 2 capsules (1,334 mg total) by mouth with breakfast, with lunch, and with evening meal. 04/10/21   Calvert Cantor, MD  Calcium Carbonate Antacid (CALCIUM CARBONATE, DOSED IN MG ELEMENTAL CALCIUM,) 1250 MG/5ML SUSP Take 5 mLs (500 mg of elemental calcium total) by mouth every 6 (six) hours as needed for indigestion. 04/08/23   Rhetta Mura, MD  carvedilol (COREG) 12.5 MG tablet Take 12.5 mg by mouth 2 (two) times daily with a meal. 04/17/20   [provider]  Cholecalciferol (VITAMIN D) 50 MCG (2000  UT) CAPS Take 2,000 Units by mouth daily.    [provider]  clopidogrel (PLAVIX) 75 MG tablet Take 1 tablet (75 mg total) by mouth daily. 11/02/22   Rhyne, Ames Coupe, PA-C  cyclobenzaprine (FLEXERIL) 10 MG tablet Take 10 mg by mouth 3 (three) times daily as needed for muscle spasms. 12/29/21   [provider]  diazepam (VALIUM) 5 MG tablet Take 5 mg by mouth 2 (two) times daily as needed for muscle spasms. 12/09/21   [provider]  insulin regular human CONCENTRATED (HUMULIN R U-500 KWIKPEN) 500 UNIT/ML KwikPen Inject 45 Units into the skin daily with supper. Patient taking differently: Inject 70-125 Units into the skin 2 (two) times daily with a meal. 09/04/21 08/30/24  [name], MD  metolazone (ZAROXOLYN) 10 MG tablet Take 10 mg by mouth daily. 07/13/21   [provider]  nortriptyline (PAMELOR) 10 MG capsule Take 20 mg by mouth at bedtime. 10/23/19 08/30/24  [provider]  oxyCODONE (ROXICODONE) 5 MG immediate release tablet Take 1 tablet (5 mg total) by mouth every 8 (eight) hours as needed. Patient taking differently: Take 5 mg by mouth every 8 (eight) hours as needed for moderate pain. 12/31/22 12/31/23  [name], [name], DO  pantoprazole (PROTONIX) 40 MG tablet Take 40 mg by mouth daily before breakfast. 02/09/21   [provider]  pregabalin (LYRICA) 75 MG capsule Take 1 capsule (75 mg  total) by mouth See admin instructions. Daily.  Give after dialysis on dialysis days. 01/21/23   [name], MD  sertraline (ZOLOFT) 100 MG tablet Take 100 mg by mouth in the morning. 02/22/21   [provider]  tamsulosin (FLOMAX) 0.4 MG CAPS capsule Take 1 capsule (0.4 mg total) by mouth daily. 11/10/21   [name], MD  torsemide (DEMADEX) 20 MG tablet Take 120 mg by mouth 2 (two) times daily.    [provider]  traZODone (DESYREL) 150 MG tablet Take 150 mg by mouth at bedtime. 12/09/21   [provider]      Allergies     Actos [pioglitazone], Dexmedetomidine, Ibuprofen, Tomato, and Wellbutrin [bupropion]    Review of Systems   Review of Systems  Respiratory:  Positive for shortness of breath.   Cardiovascular:  Positive for chest pain and leg swelling.  All other systems reviewed and are negative.   Physical Exam Updated Vital Signs BP 139/61 (BP Location: Right Arm)   Pulse 86   Temp 98.2 F (36.8 C) (Oral)   Resp 17   Ht 5\' 5"  (1.651 m)   Wt (!) 136.7 kg   SpO2 100%   BMI 50.15 kg/m  Physical Exam Vitals and nursing note reviewed.  Constitutional:      General: He is not in acute distress.    Appearance: He is well-developed. He is ill-appearing (Chronically). He is not toxic-appearing or diaphoretic.  HENT:     Head: Normocephalic and atraumatic.     Mouth/Throat:     Mouth: Mucous membranes are moist.  Eyes:     Extraocular Movements: Extraocular movements intact.     Conjunctiva/sclera: Conjunctivae normal.  Cardiovascular:     Rate and Rhythm: Normal rate and regular rhythm.     Heart sounds: No murmur heard. Pulmonary:     Effort: Pulmonary effort is normal. No tachypnea, accessory muscle usage or respiratory distress.     Breath sounds: Normal breath sounds. No wheezing or rhonchi.  Abdominal:     Palpations: Abdomen is soft.     Tenderness: There is no abdominal tenderness.  Musculoskeletal:        General: No swelling.     Cervical back: Normal range of motion and neck supple.  Skin:    General: Skin is warm and dry.     Coloration: Skin is not cyanotic or pale.  Neurological:     General: No focal deficit present.     Mental Status: He is alert and oriented to person, place, and time.  Psychiatric:        Mood and Affect: Mood normal.        Behavior: Behavior normal.     ED Results / Procedures / Treatments   Labs (all labs ordered are listed, but only abnormal results are displayed) Labs Reviewed  CBC - Abnormal; Notable for the following components:       Result Value   RBC 2.70 (*)    Hemoglobin 7.5 (*)    HCT 24.1 (*)    Platelets 123 (*)    All other components within normal limits  RENAL FUNCTION PANEL - Abnormal; Notable for the following components:   Sodium 134 (*)    Chloride 92 (*)    Glucose, Bld 246 (*)    BUN 71 (*)    Creatinine, Ser 5.27 (*)    Calcium 8.2 (*)    Phosphorus 5.3 (*)    Albumin 3.3 (*)  GFR, Estimated 12 (*)    Anion gap 17 (*)    All other components within normal limits  CBG MONITORING, ED  TROPONIN I (HIGH SENSITIVITY)  TROPONIN I (HIGH SENSITIVITY)    EKG EKG Interpretation  Date/Time:  Tuesday Apr 18 2023 07:42:38 EDT Ventricular Rate:  91 PR Interval:  168 QRS Duration: 98 QT Interval:  374 QTC Calculation: 460 R Axis:   68 Text Interpretation: Normal sinus rhythm Confirmed by Gloris Manchester [phone]) on 04/18/2023 8:59:02 AM  Radiology DG Chest 2 View  Result Date: 04/18/2023 CLINICAL DATA:  Shortness of breath. EXAM: CHEST - 2 VIEW COMPARISON:  Apr 07, 2023. FINDINGS: Stable cardiomediastinal silhouette. Left internal jugular dialysis catheter is unchanged. No acute pulmonary disease is noted. Status post right shoulder arthroplasty. IMPRESSION: No active cardiopulmonary disease. Electronically Signed   By: Lupita Raider M.D.   On: 04/18/2023 08:09    Procedures Procedures    Medications Ordered in ED Medications  Chlorhexidine Gluconate Cloth 2 % PADS 6 each (0 each Topical Hold 04/18/23 0859)    ED Course/ Medical Decision Making/ A&P                             Medical Decision Making Amount and/or Complexity of Data Reviewed Labs: ordered. Radiology: ordered.   Patient presents for dialysis need.  HD from the ED has been his routine.  He is adherent to his T, TH, SA dialysis schedule.  Last dialysis was 3 days ago.  Patient initially states that he has had some shortness of breath over the past 3 days.  He does feel that he has had increased peripheral edema.  Currently,  breathing is unlabored.  He is on his baseline 3 L of supplemental oxygen.  No wheezing or rhonchi appreciated on lung auscultation.  EKG, lab work, and chest x-ray were ordered.  Nephrology was consulted.  I spoke with nephrologist on-call, Dr. Allena Katz, who will arrange for dialysis.  EKG, x-ray, and lab work, including troponin are unremarkable.  Patient to undergo dialysis and return to ED for reassessment.        Final Clinical Impression(s) / ED Diagnoses Final diagnoses:  ESRD needing dialysis [facility])    Rx / DC Orders ED Discharge Orders     None         Gloris Manchester, MD 04/18/23 [phone]

## 2023-04-18 NOTE — Progress Notes (Signed)
POST HD TX NOTE  04/18/23 1613  Vitals  Temp 98.6 F (37 C)  Temp Source Oral  BP 130/64  MAP (mmHg) 85  BP Location Right Arm  BP Method Automatic  Patient Position (if appropriate) Lying  Pulse Rate 85  Pulse Rate Source Monitor  ECG Heart Rate 85  Resp 15  Oxygen Therapy  SpO2 100 %  O2 Device Nasal Cannula  O2 Flow Rate (L/min) 3 L/min  Pulse Oximetry Type Continuous  During Treatment Monitoring  Intra-Hemodialysis Comments (S)   (post HD tx VS check)  Post Treatment  Dialyzer Clearance Lightly streaked  Duration of HD Treatment -hour(s) 2.5 hour(s) (2hr 26 min)  Hemodialysis Intake (mL) 0 mL  Liters Processed 58.3  Fluid Removed (mL) 2000 mL  Tolerated HD Treatment Yes  Post-Hemodialysis Comments (S)  tx ended 1 hr 4 min early d/t the need to catch his ride. UF goal not met, blood rinsed back, VSS. Medication Admin: Heparin 2000 units bolus, Heparin Dwells 4200 units  Hemodialysis Catheter Left Internal jugular Double lumen Permanent (Tunneled)  Placement Date/Time: 04/07/23 1350   Placed prior to admission: No  Serial / Lot #: [id number]  Expiration Date: 03/11/27  Time Out: Correct patient;Correct site;Correct procedure  Maximum sterile barrier precautions: Hand hygiene;Cap;Mask;Sterile gow...  Site Condition No complications  Blue Lumen Status Heparin locked;Dead end cap in place  Red Lumen Status Heparin locked;Dead end cap in place  Purple Lumen Status N/A  Catheter fill solution Heparin 1000 units/ml  Catheter fill volume (Arterial) 2.1 cc  Catheter fill volume (Venous) 2.1  Dressing Type Transparent  Dressing Status Antimicrobial disc in place;Clean, Dry, Intact  Drainage Description None  Dressing Change Due 04/25/23  Post treatment catheter status Capped and Clamped

## 2023-04-18 NOTE — ED Triage Notes (Addendum)
Pt here to get dialysis. Pt c/o SOB and midsternal chest painx3d.

## 2023-04-18 NOTE — ED Provider Notes (Signed)
Patient has returned from dialysis.  He is feeling much better and would like to be discharged.  Reports that his ride is on the way.   Saddie Benders, PA-C 04/18/23 1715    Vanetta Mulders, MD 04/18/23 2310

## 2023-04-22 ENCOUNTER — Emergency Department (HOSPITAL_COMMUNITY): Payer: 59

## 2023-04-22 ENCOUNTER — Other Ambulatory Visit: Payer: Self-pay

## 2023-04-22 ENCOUNTER — Inpatient Hospital Stay (HOSPITAL_COMMUNITY)
Admission: EM | Admit: 2023-04-22 | Discharge: 2023-04-24 | DRG: 637 | Disposition: A | Discharge status: Home / Self Care | Payer: 59 | Attending: Internal Medicine | Admitting: Internal Medicine

## 2023-04-22 DIAGNOSIS — Z992 Dependence on renal dialysis: Secondary | ICD-10-CM

## 2023-04-22 DIAGNOSIS — Z886 Allergy status to analgesic agent status: Secondary | ICD-10-CM

## 2023-04-22 DIAGNOSIS — E1122 Type 2 diabetes mellitus with diabetic chronic kidney disease: Secondary | ICD-10-CM | POA: Diagnosis present

## 2023-04-22 DIAGNOSIS — J449 Chronic obstructive pulmonary disease, unspecified: Secondary | ICD-10-CM | POA: Diagnosis present

## 2023-04-22 DIAGNOSIS — I10 Essential (primary) hypertension: Secondary | ICD-10-CM | POA: Diagnosis present

## 2023-04-22 DIAGNOSIS — E782 Mixed hyperlipidemia: Secondary | ICD-10-CM | POA: Diagnosis present

## 2023-04-22 DIAGNOSIS — Z7982 Long term (current) use of aspirin: Secondary | ICD-10-CM

## 2023-04-22 DIAGNOSIS — E8770 Fluid overload, unspecified: Secondary | ICD-10-CM

## 2023-04-22 DIAGNOSIS — Z89612 Acquired absence of left leg above knee: Secondary | ICD-10-CM

## 2023-04-22 DIAGNOSIS — G4733 Obstructive sleep apnea (adult) (pediatric): Secondary | ICD-10-CM | POA: Diagnosis present

## 2023-04-22 DIAGNOSIS — I5033 Acute on chronic diastolic (congestive) heart failure: Secondary | ICD-10-CM | POA: Diagnosis present

## 2023-04-22 DIAGNOSIS — D509 Iron deficiency anemia, unspecified: Secondary | ICD-10-CM | POA: Diagnosis present

## 2023-04-22 DIAGNOSIS — E1169 Type 2 diabetes mellitus with other specified complication: Secondary | ICD-10-CM | POA: Diagnosis present

## 2023-04-22 DIAGNOSIS — N186 End stage renal disease: Secondary | ICD-10-CM | POA: Diagnosis present

## 2023-04-22 DIAGNOSIS — Z8619 Personal history of other infectious and parasitic diseases: Secondary | ICD-10-CM

## 2023-04-22 DIAGNOSIS — I5032 Chronic diastolic (congestive) heart failure: Secondary | ICD-10-CM

## 2023-04-22 DIAGNOSIS — K219 Gastro-esophageal reflux disease without esophagitis: Secondary | ICD-10-CM | POA: Diagnosis present

## 2023-04-22 DIAGNOSIS — F39 Unspecified mood [affective] disorder: Secondary | ICD-10-CM

## 2023-04-22 DIAGNOSIS — N401 Enlarged prostate with lower urinary tract symptoms: Secondary | ICD-10-CM | POA: Diagnosis not present

## 2023-04-22 DIAGNOSIS — Z91158 Patient's noncompliance with renal dialysis for other reason: Secondary | ICD-10-CM

## 2023-04-22 DIAGNOSIS — Z91018 Allergy to other foods: Secondary | ICD-10-CM

## 2023-04-22 DIAGNOSIS — F4310 Post-traumatic stress disorder, unspecified: Secondary | ICD-10-CM

## 2023-04-22 DIAGNOSIS — Z6841 Body Mass Index (BMI) 40.0 and over, adult: Secondary | ICD-10-CM

## 2023-04-22 DIAGNOSIS — Z87891 Personal history of nicotine dependence: Secondary | ICD-10-CM

## 2023-04-22 DIAGNOSIS — E1151 Type 2 diabetes mellitus with diabetic peripheral angiopathy without gangrene: Secondary | ICD-10-CM | POA: Diagnosis present

## 2023-04-22 DIAGNOSIS — Z888 Allergy status to other drugs, medicaments and biological substances status: Secondary | ICD-10-CM

## 2023-04-22 DIAGNOSIS — R3912 Poor urinary stream: Secondary | ICD-10-CM | POA: Diagnosis present

## 2023-04-22 DIAGNOSIS — Z87892 Personal history of anaphylaxis: Secondary | ICD-10-CM

## 2023-04-22 DIAGNOSIS — Z794 Long term (current) use of insulin: Secondary | ICD-10-CM

## 2023-04-22 DIAGNOSIS — E1165 Type 2 diabetes mellitus with hyperglycemia: Principal | ICD-10-CM | POA: Diagnosis present

## 2023-04-22 DIAGNOSIS — Z79899 Other long term (current) drug therapy: Secondary | ICD-10-CM

## 2023-04-22 DIAGNOSIS — I132 Hypertensive heart and chronic kidney disease with heart failure and with stage 5 chronic kidney disease, or end stage renal disease: Secondary | ICD-10-CM | POA: Diagnosis present

## 2023-04-22 DIAGNOSIS — Z7902 Long term (current) use of antithrombotics/antiplatelets: Secondary | ICD-10-CM

## 2023-04-22 DIAGNOSIS — E1142 Type 2 diabetes mellitus with diabetic polyneuropathy: Secondary | ICD-10-CM | POA: Diagnosis present

## 2023-04-22 DIAGNOSIS — D631 Anemia in chronic kidney disease: Secondary | ICD-10-CM | POA: Diagnosis present

## 2023-04-22 DIAGNOSIS — I252 Old myocardial infarction: Secondary | ICD-10-CM

## 2023-04-22 DIAGNOSIS — N2581 Secondary hyperparathyroidism of renal origin: Secondary | ICD-10-CM | POA: Diagnosis present

## 2023-04-22 DIAGNOSIS — F330 Major depressive disorder, recurrent, mild: Secondary | ICD-10-CM | POA: Diagnosis present

## 2023-04-22 DIAGNOSIS — N250 Renal osteodystrophy: Secondary | ICD-10-CM | POA: Diagnosis present

## 2023-04-22 LAB — RENAL FUNCTION PANEL
Albumin: 3.3 g/dL — ABNORMAL LOW (ref 3.5–5.0)
Anion gap: 11 (ref 5–15)
BUN: 87 mg/dL — ABNORMAL HIGH (ref 8–23)
CO2: 26 mmol/L (ref 22–32)
Calcium: 7.9 mg/dL — ABNORMAL LOW (ref 8.9–10.3)
Chloride: 94 mmol/L — ABNORMAL LOW (ref 98–111)
Creatinine, Ser: 4.72 mg/dL — ABNORMAL HIGH (ref 0.61–1.24)
GFR, Estimated: 13 mL/min — ABNORMAL LOW (ref >60)
Glucose, Bld: 527 mg/dL (ref 70–99)
Phosphorus: 4.3 mg/dL (ref 2.5–4.6)
Potassium: 4.1 mmol/L (ref 3.5–5.1)
Sodium: 131 mmol/L — ABNORMAL LOW (ref 135–145)

## 2023-04-22 LAB — GLUCOSE, CAPILLARY
Glucose-Capillary: 323 mg/dL — ABNORMAL HIGH (ref 70–99)
Glucose-Capillary: 269 mg/dL — ABNORMAL HIGH (ref 70–99)
Glucose-Capillary: 186 mg/dL — ABNORMAL HIGH (ref 70–99)
Glucose-Capillary: 156 mg/dL — ABNORMAL HIGH (ref 70–99)
Glucose-Capillary: 134 mg/dL — ABNORMAL HIGH (ref 70–99)

## 2023-04-22 LAB — CBC
HCT: 24 % — ABNORMAL LOW (ref 39.0–52.0)
Hemoglobin: 7.5 g/dL — ABNORMAL LOW (ref 13.0–17.0)
MCH: 28 pg (ref 26.0–34.0)
MCHC: 31.3 g/dL (ref 30.0–36.0)
MCV: 89.6 fL (ref 80.0–100.0)
Platelets: 108 10*3/uL — ABNORMAL LOW (ref 150–400)
RBC: 2.68 MIL/uL — ABNORMAL LOW (ref 4.22–5.81)
RDW: 14.6 % (ref 11.5–15.5)
WBC: 7.1 10*3/uL (ref 4.0–10.5)
nRBC: 0 % (ref 0.0–0.2)

## 2023-04-22 MED ORDER — PANTOPRAZOLE SODIUM 40 MG PO TBEC
40.0000 mg | DELAYED_RELEASE_TABLET | Freq: Every day | ORAL | Status: DC
  Administered 2023-04-23 – 2023-04-24 (×2): 40 mg via ORAL
  Filled 2023-04-22 (×2): qty 1

## 2023-04-22 MED ORDER — CHLORHEXIDINE GLUCONATE CLOTH 2 % EX PADS
6.0000 | MEDICATED_PAD | Freq: Every day | CUTANEOUS | Status: DC
  Administered 2023-04-22 – 2023-04-24 (×3): 6 via TOPICAL

## 2023-04-22 MED ORDER — PENTAFLUOROPROP-TETRAFLUOROETH EX AERO: 1.0000 | INHALATION_SPRAY | CUTANEOUS | Status: DC | PRN

## 2023-04-22 MED ORDER — PREGABALIN 75 MG PO CAPS: 75.0000 mg | ORAL_CAPSULE | ORAL | Status: DC

## 2023-04-22 MED ORDER — HEPARIN SODIUM (PORCINE) 5000 UNIT/ML IJ SOLN
5000.0000 [IU] | Freq: Three times a day (TID) | INTRAMUSCULAR | Status: DC
  Administered 2023-04-22 – 2023-04-24 (×5): 5000 [IU] via SUBCUTANEOUS
  Filled 2023-04-22 (×5): qty 1

## 2023-04-22 MED ORDER — LACTATED RINGERS IV SOLN: INTRAVENOUS | Status: DC

## 2023-04-22 MED ORDER — HEPARIN SODIUM (PORCINE) 1000 UNIT/ML IJ SOLN
4200.0000 [IU] | Freq: Once | INTRAMUSCULAR | Status: AC
  Administered 2023-04-23: 4200 [IU]
  Filled 2023-04-22: qty 5
  Filled 2023-04-22: qty 4.2

## 2023-04-22 MED ORDER — SERTRALINE HCL 100 MG PO TABS
100.0000 mg | ORAL_TABLET | Freq: Every morning | ORAL | Status: DC
  Administered 2023-04-23 – 2023-04-24 (×2): 100 mg via ORAL
  Filled 2023-04-22 (×2): qty 1

## 2023-04-22 MED ORDER — DIAZEPAM 5 MG PO TABS
5.0000 mg | ORAL_TABLET | Freq: Two times a day (BID) | ORAL | Status: DC | PRN
  Administered 2023-04-22: 5 mg via ORAL
  Filled 2023-04-22: qty 1

## 2023-04-22 MED ORDER — ACETAMINOPHEN 325 MG PO TABS: 650.0000 mg | ORAL_TABLET | Freq: Four times a day (QID) | ORAL | Status: DC | PRN

## 2023-04-22 MED ORDER — ACETAMINOPHEN 500 MG PO TABS
1000.0000 mg | ORAL_TABLET | Freq: Once | ORAL | Status: AC
  Administered 2023-04-22: 1000 mg via ORAL
  Filled 2023-04-22: qty 2

## 2023-04-22 MED ORDER — TAMSULOSIN HCL 0.4 MG PO CAPS
0.4000 mg | ORAL_CAPSULE | Freq: Every day | ORAL | Status: DC
  Administered 2023-04-23 – 2023-04-24 (×2): 0.4 mg via ORAL
  Filled 2023-04-22 (×2): qty 1

## 2023-04-22 MED ORDER — METOLAZONE 5 MG PO TABS
10.0000 mg | ORAL_TABLET | Freq: Every day | ORAL | Status: DC
  Administered 2023-04-23 – 2023-04-24 (×2): 10 mg via ORAL
  Filled 2023-04-22 (×2): qty 2

## 2023-04-22 MED ORDER — CALCIUM ACETATE (PHOS BINDER) 667 MG PO CAPS
1334.0000 mg | ORAL_CAPSULE | Freq: Three times a day (TID) | ORAL | Status: DC
  Administered 2023-04-23 – 2023-04-24 (×6): 1334 mg via ORAL
  Filled 2023-04-22 (×6): qty 2

## 2023-04-22 MED ORDER — DEXTROSE 50 % IV SOLN: 0.0000 mL | INTRAVENOUS | Status: DC | PRN

## 2023-04-22 MED ORDER — AMLODIPINE BESYLATE 10 MG PO TABS
10.0000 mg | ORAL_TABLET | Freq: Every morning | ORAL | Status: DC
  Administered 2023-04-23 – 2023-04-24 (×2): 10 mg via ORAL
  Filled 2023-04-22 (×2): qty 1

## 2023-04-22 MED ORDER — TORSEMIDE 20 MG PO TABS
120.0000 mg | ORAL_TABLET | Freq: Two times a day (BID) | ORAL | Status: DC
  Administered 2023-04-23 – 2023-04-24 (×4): 120 mg via ORAL
  Filled 2023-04-22 (×5): qty 1

## 2023-04-22 MED ORDER — LIDOCAINE HCL (PF) 1 % IJ SOLN: 5.0000 mL | INTRAMUSCULAR | Status: DC | PRN

## 2023-04-22 MED ORDER — LIDOCAINE-PRILOCAINE 2.5-2.5 % EX CREA
1.0000 | TOPICAL_CREAM | CUTANEOUS | Status: DC | PRN
  Filled 2023-04-22: qty 5

## 2023-04-22 MED ORDER — NORTRIPTYLINE HCL 10 MG PO CAPS
20.0000 mg | ORAL_CAPSULE | Freq: Every day | ORAL | Status: DC
  Administered 2023-04-22 – 2023-04-23 (×2): 20 mg via ORAL
  Filled 2023-04-22 (×3): qty 2

## 2023-04-22 MED ORDER — INSULIN REGULAR(HUMAN) IN NACL 100-0.9 UT/100ML-% IV SOLN
INTRAVENOUS | Status: DC
  Administered 2023-04-22: 11.5 [IU]/h via INTRAVENOUS
  Filled 2023-04-22: qty 100

## 2023-04-22 MED ORDER — CARVEDILOL 12.5 MG PO TABS
12.5000 mg | ORAL_TABLET | Freq: Two times a day (BID) | ORAL | Status: DC
  Administered 2023-04-23 – 2023-04-24 (×4): 12.5 mg via ORAL
  Filled 2023-04-22 (×4): qty 1

## 2023-04-22 MED ORDER — HEPARIN SODIUM (PORCINE) 1000 UNIT/ML DIALYSIS
2000.0000 [IU] | INTRAMUSCULAR | Status: DC | PRN
  Administered 2023-04-22: 2200 [IU] via INTRAVENOUS_CENTRAL
  Filled 2023-04-22: qty 2

## 2023-04-22 MED ORDER — TRAZODONE HCL 150 MG PO TABS
150.0000 mg | ORAL_TABLET | Freq: Every day | ORAL | Status: DC
  Administered 2023-04-22 – 2023-04-23 (×2): 150 mg via ORAL
  Filled 2023-04-22 (×3): qty 1

## 2023-04-22 MED ORDER — ATORVASTATIN CALCIUM 40 MG PO TABS
40.0000 mg | ORAL_TABLET | Freq: Every morning | ORAL | Status: DC
  Administered 2023-04-23 – 2023-04-24 (×2): 40 mg via ORAL
  Filled 2023-04-22 (×3): qty 1

## 2023-04-22 MED ORDER — DEXTROSE IN LACTATED RINGERS 5 % IV SOLN: INTRAVENOUS | Status: DC

## 2023-04-22 NOTE — ED Triage Notes (Signed)
Pt stating he needs dialysis. Pt reports usually T, Th, Sa dialysis, but missed Th. C/o DOE and having extra fluid.

## 2023-04-22 NOTE — Progress Notes (Signed)
   04/22/23 1345  Vitals  Temp 98 F (36.7 C)  Pulse Rate (!) 102  Resp 20  BP (!) 140/95  SpO2 100 %  O2 Device Nasal Cannula  Weight  (unable to weigh--ed cart)  Type of Weight Post-Dialysis  Oxygen Therapy  O2 Flow Rate (L/min) 3 L/min  Patient Activity (if Appropriate) In bed  Pulse Oximetry Type Continuous  Oximetry Probe Site Changed No  Post Treatment  Dialyzer Clearance Lightly streaked  Duration of HD Treatment -hour(s) 3.15 hour(s)  Hemodialysis Intake (mL) 0 mL  Liters Processed 78.1  Fluid Removed (mL) 2000 mL  Tolerated HD Treatment Yes  Post-Hemodialysis Comments admitting to 4U98   Received patient in bed to unit.  Alert and oriented.  Informed consent signed and in chart.   TX duration:3.15  Patient tolerated well.  Transported back to the room --admitted to 5w32 Alert, without acute distress.  Hand-off given to patient's nurse.   Access used: LDLC Access issues: no complications  Total UF removed: 2000 Medication(s) given: none    [facility]

## 2023-04-22 NOTE — ED Notes (Signed)
Per PA, pt will go to dialysis and then will admit pt and start endotool

## 2023-04-22 NOTE — ED Notes (Signed)
Pt to dialysis.

## 2023-04-22 NOTE — ED Provider Notes (Signed)
[place] EMERGENCY DEPARTMENT AT [facility] Provider Note   CSN: [id number] Arrival date & time: 04/22/23  4098     History  Chief Complaint  Patient presents with   Needing dialysis    Jeffrey [name] is a 61 y.o. male with history of ESRD on hemodialysis T/R/S, T2DM, HTN, CHF, COPD, GERD, depression, who presents to the ER complaining of needing dialysis. Missed Thursday's session. Complaining of dyspnea on exertion and feeling swollen.   HPI     Home Medications Prior to Admission medications   Medication Sig Start Date End Date Taking? Authorizing Provider  amLODipine (NORVASC) 10 MG tablet Take 10 mg by mouth every morning. 04/17/20   [provider]  ASPIRIN LOW DOSE 81 MG EC tablet Take 81 mg by mouth in the morning. 02/22/21   [provider]  atorvastatin (LIPITOR) 40 MG tablet Take 40 mg by mouth in the morning. 02/09/21   [provider]  calcium acetate (PHOSLO) 667 MG capsule Take 2 capsules (1,334 mg total) by mouth with breakfast, with lunch, and with evening meal. 04/10/21   Calvert Cantor, MD  Calcium Carbonate Antacid (CALCIUM CARBONATE, DOSED IN MG ELEMENTAL CALCIUM,) 1250 MG/5ML SUSP Take 5 mLs (500 mg of elemental calcium total) by mouth every 6 (six) hours as needed for indigestion. 04/08/23   Rhetta Mura, MD  carvedilol (COREG) 12.5 MG tablet Take 12.5 mg by mouth 2 (two) times daily with a meal. 04/17/20   [provider]  Cholecalciferol (VITAMIN D) 50 MCG (2000 UT) CAPS Take 2,000 Units by mouth daily.    [provider]  clopidogrel (PLAVIX) 75 MG tablet Take 1 tablet (75 mg total) by mouth daily. 11/02/22   Rhyne, Ames Coupe, PA-C  cyclobenzaprine (FLEXERIL) 10 MG tablet Take 10 mg by mouth 3 (three) times daily as needed for muscle spasms. 12/29/21   [provider]  diazepam (VALIUM) 5 MG tablet Take 5 mg by mouth 2 (two) times daily as needed for muscle spasms. 12/09/21   [provider]  insulin regular human CONCENTRATED (HUMULIN R U-500 KWIKPEN) 500 UNIT/ML KwikPen Inject 45 Units into the skin daily with supper. Patient taking differently: Inject 70-125 Units into the skin 2 (two) times daily with a meal. 09/04/21 08/30/24  [name], MD  metolazone (ZAROXOLYN) 10 MG tablet Take 10 mg by mouth daily. 07/13/21   [provider]  nortriptyline (PAMELOR) 10 MG capsule Take 20 mg by mouth at bedtime. 10/23/19 08/30/24  [provider]  oxyCODONE (ROXICODONE) 5 MG immediate release tablet Take 1 tablet (5 mg total) by mouth every 8 (eight) hours as needed. Patient taking differently: Take 5 mg by mouth every 8 (eight) hours as needed for moderate pain. 12/31/22 12/31/23  [name], [name], DO  pantoprazole (PROTONIX) 40 MG tablet Take 40 mg by mouth daily before breakfast. 02/09/21   [provider]  pregabalin (LYRICA) 75 MG capsule Take 1 capsule (75 mg total) by mouth See admin instructions. Daily.  Give after dialysis on dialysis days. 01/21/23   [name], MD  sertraline (ZOLOFT) 100 MG tablet Take 100 mg by mouth in the morning. 02/22/21   [provider]  tamsulosin (FLOMAX) 0.4 MG CAPS capsule Take 1 capsule (0.4 mg total) by mouth daily. 11/10/21   [name], MD  torsemide (DEMADEX) 20 MG tablet Take 120 mg by mouth 2 (two) times daily.    [provider]  traZODone (DESYREL) 150 MG  tablet Take 150 mg by mouth at bedtime. 12/09/21   [provider]      Allergies    Actos [pioglitazone], Dexmedetomidine, Ibuprofen, Tomato, and Wellbutrin [bupropion]    Review of Systems   Review of Systems  Respiratory:  Positive for shortness of breath.   Cardiovascular:  Positive for leg swelling.  All other systems reviewed and are negative.   Physical Exam Updated Vital Signs BP (!) 148/69 (BP Location: Right Wrist)   Pulse 99   Temp 98.2 F (36.8 C) (Oral)   Resp 19   SpO2 100%  Physical Exam Vitals  and nursing note reviewed.  Constitutional:      Appearance: Normal appearance. He is obese.  HENT:     Head: Normocephalic and atraumatic.  Eyes:     Conjunctiva/sclera: Conjunctivae normal.  Cardiovascular:     Rate and Rhythm: Normal rate and regular rhythm.  Pulmonary:     Effort: Pulmonary effort is normal. No respiratory distress.     Breath sounds: Normal breath sounds.     Comments: On baseline 3L O2 [place] Abdominal:     General: There is no distension.     Palpations: Abdomen is soft.     Tenderness: There is no abdominal tenderness.  Skin:    General: Skin is warm and dry.  Neurological:     General: No focal deficit present.     Mental Status: He is alert.     ED Results / Procedures / Treatments   Labs (all labs ordered are listed, but only abnormal results are displayed) Labs Reviewed  CBC - Abnormal; Notable for the following components:      Result Value   RBC 2.68 (*)    Hemoglobin 7.5 (*)    HCT 24.0 (*)    Platelets 108 (*)    All other components within normal limits  RENAL FUNCTION PANEL - Abnormal; Notable for the following components:   Sodium 131 (*)    Chloride 94 (*)    Glucose, Bld 527 (*)    BUN 87 (*)    Creatinine, Ser 4.72 (*)    Calcium 7.9 (*)    Albumin 3.3 (*)    GFR, Estimated 13 (*)    All other components within normal limits  URINALYSIS, ROUTINE W REFLEX MICROSCOPIC  CBG MONITORING, ED    EKG None  Radiology DG Chest 2 View  Result Date: 04/22/2023 CLINICAL DATA:  Dyspnea on exertion. EXAM: CHEST - 2 VIEW COMPARISON:  Chest x-ray dated Apr 18, 2023. FINDINGS: Unchanged tunneled left internal jugular dialysis catheter with tip in the proximal right atrium. Stable cardiomediastinal silhouette. Right brachiocephalic vein stent again noted. Normal pulmonary vascularity. No focal consolidation, pleural effusion, or pneumothorax. No acute osseous abnormality. IMPRESSION: 1. No acute cardiopulmonary disease. Electronically Signed    By: Obie Dredge M.D.   On: 04/22/2023 08:59    Procedures Procedures    Medications Ordered in ED Medications  Chlorhexidine Gluconate Cloth 2 % PADS 6 each (has no administration in time range)  pentafluoroprop-tetrafluoroeth (GEBAUERS) aerosol 1 Application (has no administration in time range)  lidocaine (PF) (XYLOCAINE) 1 % injection 5 mL (has no administration in time range)  lidocaine-prilocaine (EMLA) cream 1 Application (has no administration in time range)  heparin injection 2,000 Units (has no administration in time range)  insulin regular, human (MYXREDLIN) 100 units/ 100 mL infusion (has no administration in time range)  lactated ringers infusion (has no administration in time range)  dextrose  5 % in lactated ringers infusion (has no administration in time range)  dextrose 50 % solution 0-50 mL (has no administration in time range)  heparin sodium (porcine) injection 4,200 Units (has no administration in time range)    ED Course/ Medical Decision Making/ A&P                             Medical Decision Making Amount and/or Complexity of Data Reviewed Labs: ordered. Radiology: ordered.  Risk Prescription drug management. Decision regarding hospitalization.   This patient is a 62 y.o. male  who presents to the ED for concern of needing dialysis. Normally T/R/S, missed Thursday's session.    Past Medical History / Co-morbidities / Social History: ESRD on hemodialysis T/R/S, T2DM, HTN, CHF, COPD, GERD, depression  Additional history: Chart reviewed. Pertinent results include: Most recently seen in ER on 5/21 for dialysis. Most recently admitted 5/9 for hyperglycemia with dialysis  Physical Exam: Physical exam performed. The pertinent findings include: Mildly tachycardic, otherwise normal vitals. On his baseline 3L O2 [place]. No acute distress.   Lab Tests/Imaging studies: I personally interpreted labs/imaging and the pertinent results include:  hemoglobin at  baseline 7.5, no leukocytosis. Glucose 527, Na 131, Chloride 94, creatinine 4.72, no anion gap. CXR without acute abnormalities. I agree with the radiologist interpretation.  Cardiac monitoring: EKG obtained and interpreted by my attending physician which shows: sinus tachycardia   Medications: I ordered medication including endotool via hyperglycemic crisis order set.  I have reviewed the patients home medicines and have made adjustments as needed.  Consultations obtained: I consulted with nephrologist Dr Allena Katz who will place orders for patient to receive dialysis.  I consulted with hospitalist Dr Alinda Money who will admit.    Disposition: After consideration of the diagnostic results and the patients response to treatment, I feel that patient would benefit from medical admission for hyperglycemia without DKA or HHS and need for hemodialysis due to ESRD.  I discussed this case with my attending physician Dr. Jacqulyn Bath who cosigned this note including patient's presenting symptoms, physical exam, and planned diagnostics and interventions. Attending physician stated agreement with plan or made changes to plan which were implemented.   Final Clinical Impression(s) / ED Diagnoses Final diagnoses:  Hyperglycemia due to diabetes mellitus (HCC)  Encounter for dialysis [facility])    Rx / DC Orders ED Discharge Orders     None      Portions of this report may have been transcribed using voice recognition software. Every effort was made to ensure accuracy; however, inadvertent computerized transcription errors may be present.    Jeanella Flattery 04/22/23 1249    Maia Plan, MD 04/22/23 1534

## 2023-04-22 NOTE — Inpatient Diabetes Management (Addendum)
Inpatient Diabetes Program Recommendations  AACE/ADA: New Consensus Statement on Inpatient Glycemic Control (2015)  Target Ranges:  Prepandial:   less than 140 mg/dL      Peak postprandial:   less than 180 mg/dL (1-2 hours)      Critically ill patients:  140 - 180 mg/dL    Latest Reference Range & Units 04/22/23 08:11  Sodium 135 - 145 mmol/L 131 (L)  Potassium 3.5 - 5.1 mmol/L 4.1  Chloride 98 - 111 mmol/L 94 (L)  CO2 22 - 32 mmol/L 26  Glucose 70 - 99 mg/dL 161 (HH)  BUN 8 - 23 mg/dL 87 (H)  Creatinine 0.96 - 1.24 mg/dL 0.45 (H)  Calcium 8.9 - 10.3 mg/dL 7.9 (L)  Anion gap 5 - 15  11  (HH): Data is critically high (L): Data is abnormally low (H): Data is abnormally high    Admit with: DOE/ Missed Dialysis  History: DM2, ESRD  Home DM Meds: Concentrated Humulin-R U500 Insulin        70-125 units BID with meals  Current Orders: IV Insulin Drip    Per RN note at 9:38am:  "Per PA, pt will go to dialysis and then will admit pt and start endotool"  Note from Dr. Vesta Mixer note at 2pm today, plan is to check CBG now after HD session and start the IV Insulin Drip.  Once CBGs are stable, can transition back to SQ Insulin.  Recommend:  Once pt has 4 consecutive CBGs <180 while on IV Insulin Drip, begin transition to Humulin U-500 Insulin  Recommend we start with the lower end dose he takes at home and can titrate up as needed U500 Insulin 70 units BID with meals    ENDO: [facility] Last seen Oct 2023 Non-dialysis days U-500 pen Take 140 units before breakfast  Take 140 units before dinner  Dialysis days Before breakfast If blood sugar is less than 100 eat first and then take 50 units If blood sugar is > 150 eat, then take 55 units  Before dinner 140 units     --Will follow patient during hospitalization--  Ambrose Finland RN, MSN, CDCES Diabetes Coordinator Inpatient Glycemic Control Team Team Pager: [phone] (8a-5p)

## 2023-04-22 NOTE — H&P (Signed)
History and Physical   Jeffrey [name]:[id number] DOB: Apr 01, 1961 DOA: 04/22/2023  PCP: Knox Royalty, MD   Patient coming from: Home  Chief Complaint: Dyspnea, needing HD ESRD on HD, diabetes, neuropathy, OSA, obesity, anemia, GERD, COPD, diastolic CHF,  HPI: Jeffrey [name] is a 62 y.o. male with medical history significant of hypertension, hyperlipidemia, depression, PTSD, BPH, status post spinal surgery, PAD status post left AKA presenting with need for dialysis and reporting shortness of breath.  Patient currently does not have a dialysis center and gets his dialysis by coming to the ED.  Has a left tunneled IJ and is awaiting for AV graft to mature.  Was last admitted with need for dialysis and access earlier this month also noted to have significant hyperglycemia at that time her and takes high doses of U-500 twice a day outpatient.  He missed his dialysis on Thursday and is feeling volume overloaded and having dyspnea on exertion.  He denies fevers, chills, chest pain, abdominal pain, constipation, diarrhea, nausea, vomiting.  ED Course: Vital signs in the ED significant for blood pressure in the 120s to 150s systolic, heart rate in the 90s to 100s, on 3 to 4 L to maintain saturations in the ED.  Lab workup included CMP with sodium 131 which improved considering glucose 527, chloride 94, BUN 87, creatinine 4.72, calcium 7.9, albumin 3.3.  CBC with hemoglobin stable at 7.5, platelets stable at 108.  Urinalysis pending.  Chest x-ray showed no acute abnormality.  Patient started on insulin drip with IV fluids in the ED.  Review of Systems: As per HPI otherwise all other systems reviewed and are negative.  Past Medical History:  Diagnosis Date   Altered mental status 05/04/2021   Arthritis    Blood transfusion without reported diagnosis    CHF (congestive heart failure) (HCC)    COPD (chronic obstructive pulmonary disease) (HCC)    Depression    Diabetes mellitus without complication  (HCC)    type 2   Dyspnea    ESRD on hemodialysis (HCC)    Tues Thurs Sat   GERD (gastroesophageal reflux disease)    protonix   Heart murmur    as a child, no problems   Hypertension    Myocardial infarction (HCC)    PTSD (post-traumatic stress disorder)    Sepsis (HCC)    Sleep apnea    occasional uses CPAP   Uses powered wheelchair     Past Surgical History:  Procedure Laterality Date   A/V FISTULAGRAM N/A 08/30/2021   Procedure: A/V FISTULAGRAM;  Surgeon: Maeola Harman, MD;  Location: [facility] INVASIVE CV LAB;  Service: Cardiovascular;  Laterality: N/A;   A/V FISTULAGRAM Right 07/28/2022   Procedure: A/V Fistulagram;  Surgeon: Maeola Harman, MD;  Location: [facility] INVASIVE CV LAB;  Service: Cardiovascular;  Laterality: Right;   A/V FISTULAGRAM Right 09/05/2022   Procedure: A/V Fistulagram;  Surgeon: Maeola Harman, MD;  Location: [facility] INVASIVE CV LAB;  Service: Cardiovascular;  Laterality: Right;   A/V FISTULAGRAM Right 10/31/2022   Procedure: A/V Fistulagram;  Surgeon: Maeola Harman, MD;  Location: [facility] INVASIVE CV LAB;  Service: Cardiovascular;  Laterality: Right;   A/V SHUNT INTERVENTION Right 10/31/2022   Procedure: A/V SHUNT INTERVENTION;  Surgeon: Maeola Harman, MD;  Location: [facility] INVASIVE CV LAB;  Service: Cardiovascular;  Laterality: Right;   A/V SHUNTOGRAM N/A 11/08/2021   Procedure: A/V SHUNTOGRAM;  Surgeon: Maeola Harman, MD;  Location: [facility] INVASIVE CV LAB;  Service:  Cardiovascular;  Laterality: N/A;   AMPUTATION Left 04/03/2021   Procedure: LEFT ABOVE-THE-KNEE AMPUTATION;  Surgeon: Terance Hart, MD;  Location: [facility] OR;  Service: Orthopedics;  Laterality: Left;   AV FISTULA PLACEMENT Right 08/12/2021   Procedure: INSERTION OF ARTERIOVENOUS (AV) GORE-TEX GRAFT RIGHT ARM;  Surgeon: Nada Libman, MD;  Location: [facility] OR;  Service: Vascular;  Laterality: Right;   AV FISTULA PLACEMENT Left 04/07/2023   Procedure:  ARTERIOVENOUS (AV) FISTULA GRAFT USING GORETEX STRETCH (4-7MM);  Surgeon: Chuck Hint, MD;  Location: [facility] OR;  Service: Vascular;  Laterality: Left;   INSERTION OF DIALYSIS CATHETER N/A 11/25/2022   Procedure: INSERTION OF DIALYSIS CATHETER;  Surgeon: Lorin Glass, MD;  Location: [facility] ENDOSCOPY;  Service: Pulmonary;  Laterality: N/A;   INSERTION OF DIALYSIS CATHETER N/A 03/03/2023   Procedure: INSERTION OF TUNNELED DIALYSIS CATHETER;  Surgeon: Maeola Harman, MD;  Location: [facility] OR;  Service: Vascular;  Laterality: N/A;   INSERTION OF DIALYSIS CATHETER Left 04/07/2023   Procedure: INSERTION OF DIALYSIS CATHETER USING PALINDROME  28CM PRECISION CHRONIC CATHETER KIT;  Surgeon: Chuck Hint, MD;  Location: [facility] OR;  Service: Vascular;  Laterality: Left;   IR REMOVAL TUN CV CATH W/O FL  03/30/2021   JOINT REPLACEMENT     Bilateral knees   LIGATION ARTERIOVENOUS GORTEX GRAFT Right 03/03/2023   Procedure: LIGATION RIGHT ARM ARTERIOVENOUS GORTEX GRAFT;  Surgeon: Maeola Harman, MD;  Location: [facility] OR;  Service: Vascular;  Laterality: Right;   PERIPHERAL VASCULAR BALLOON ANGIOPLASTY Right 08/30/2021   Procedure: PERIPHERAL VASCULAR BALLOON ANGIOPLASTY;  Surgeon: Maeola Harman, MD;  Location: [facility] INVASIVE CV LAB;  Service: Cardiovascular;  Laterality: Right;   PERIPHERAL VASCULAR BALLOON ANGIOPLASTY Right 07/28/2022   Procedure: PERIPHERAL VASCULAR BALLOON ANGIOPLASTY;  Surgeon: Maeola Harman, MD;  Location: [facility] INVASIVE CV LAB;  Service: Cardiovascular;  Laterality: Right;  AVF   PERIPHERAL VASCULAR INTERVENTION  11/08/2021   Procedure: PERIPHERAL VASCULAR INTERVENTION;  Surgeon: Maeola Harman, MD;  Location: [facility] INVASIVE CV LAB;  Service: Cardiovascular;;  Central rt arm fistula   UPPER EXTREMITY VENOGRAPHY Right 08/10/2021   Procedure: UPPER EXTREMITY VENOGRAPHY;  Surgeon: Nada Libman, MD;  Location: [facility] INVASIVE CV LAB;  Service:  Cardiovascular;  Laterality: Right;    Social History  reports that he quit smoking about 12 years ago. His smoking use included cigarettes. He has never used smokeless tobacco. He reports current alcohol use. He reports that he does not currently use drugs.  Allergies  Allergen Reactions   Actos [Pioglitazone] Anaphylaxis and Rash   Dexmedetomidine Nausea And Vomiting    (Precedex) Dose-limiting bradycardia     Ibuprofen Shortness Of Breath and Swelling    Pt tolerates aspirin    Tomato Anaphylaxis    Only allergic to RAW tomatoes   Wellbutrin [Bupropion] Swelling    No family history on file.   Prior to Admission medications   Medication Sig Start Date End Date Taking? Authorizing Provider  amLODipine (NORVASC) 10 MG tablet Take 10 mg by mouth every morning. 04/17/20   [provider]  ASPIRIN LOW DOSE 81 MG EC tablet Take 81 mg by mouth in the morning. 02/22/21   [provider]  atorvastatin (LIPITOR) 40 MG tablet Take 40 mg by mouth in the morning. 02/09/21   [provider]  calcium acetate (PHOSLO) 667 MG capsule Take 2 capsules (1,334 mg total) by mouth with breakfast, with lunch, and with evening meal. 04/10/21   Rizwan,  Ladell Heads, MD  Calcium Carbonate Antacid (CALCIUM CARBONATE, DOSED IN MG ELEMENTAL CALCIUM,) 1250 MG/5ML SUSP Take 5 mLs (500 mg of elemental calcium total) by mouth every 6 (six) hours as needed for indigestion. 04/08/23   Rhetta Mura, MD  carvedilol (COREG) 12.5 MG tablet Take 12.5 mg by mouth 2 (two) times daily with a meal. 04/17/20   [provider]  Cholecalciferol (VITAMIN D) 50 MCG (2000 UT) CAPS Take 2,000 Units by mouth daily.    [provider]  clopidogrel (PLAVIX) 75 MG tablet Take 1 tablet (75 mg total) by mouth daily. 11/02/22   Rhyne, Ames Coupe, PA-C  cyclobenzaprine (FLEXERIL) 10 MG tablet Take 10 mg by mouth 3 (three) times daily as needed for muscle spasms. 12/29/21   [provider]   diazepam (VALIUM) 5 MG tablet Take 5 mg by mouth 2 (two) times daily as needed for muscle spasms. 12/09/21   [provider]  insulin regular human CONCENTRATED (HUMULIN R U-500 KWIKPEN) 500 UNIT/ML KwikPen Inject 45 Units into the skin daily with supper. Patient taking differently: Inject 70-125 Units into the skin 2 (two) times daily with a meal. 09/04/21 08/30/24  [name], MD  metolazone (ZAROXOLYN) 10 MG tablet Take 10 mg by mouth daily. 07/13/21   [provider]  nortriptyline (PAMELOR) 10 MG capsule Take 20 mg by mouth at bedtime. 10/23/19 08/30/24  [provider]  oxyCODONE (ROXICODONE) 5 MG immediate release tablet Take 1 tablet (5 mg total) by mouth every 8 (eight) hours as needed. Patient taking differently: Take 5 mg by mouth every 8 (eight) hours as needed for moderate pain. 12/31/22 12/31/23  [name], [name], DO  pantoprazole (PROTONIX) 40 MG tablet Take 40 mg by mouth daily before breakfast. 02/09/21   [provider]  pregabalin (LYRICA) 75 MG capsule Take 1 capsule (75 mg total) by mouth See admin instructions. Daily.  Give after dialysis on dialysis days. 01/21/23   [name], MD  sertraline (ZOLOFT) 100 MG tablet Take 100 mg by mouth in the morning. 02/22/21   [provider]  tamsulosin (FLOMAX) 0.4 MG CAPS capsule Take 1 capsule (0.4 mg total) by mouth daily. 11/10/21   [name], MD  torsemide (DEMADEX) 20 MG tablet Take 120 mg by mouth 2 (two) times daily.    [provider]  traZODone (DESYREL) 150 MG tablet Take 150 mg by mouth at bedtime. 12/09/21   [provider]    Physical Exam: Vitals:   04/22/23 1030 04/22/23 1100 04/22/23 1130 04/22/23 1200  BP: 139/85 (!) 148/69 (!) 173/79 134/76  Pulse: 100 99 (!) 103 100  Resp: (!) 25 19 18 20   Temp:      TempSrc:      SpO2: 99% 100% 100% 100%    Physical Exam Constitutional:      General: He is not in acute distress.    Appearance: Normal  appearance. He is obese.  HENT:     Head: Normocephalic and atraumatic.     Mouth/Throat:     Mouth: Mucous membranes are moist.     Pharynx: Oropharynx is clear.  Eyes:     Extraocular Movements: Extraocular movements intact.     Pupils: Pupils are equal, round, and reactive to light.  Cardiovascular:     Rate and Rhythm: Normal rate and regular rhythm.     Pulses: Normal pulses.     Heart sounds: Normal heart sounds.  Pulmonary:     Effort: Pulmonary effort  is normal. No respiratory distress.     Breath sounds: Normal breath sounds.  Abdominal:     General: Bowel sounds are normal. There is no distension.     Palpations: Abdomen is soft.     Tenderness: There is no abdominal tenderness.  Musculoskeletal:        General: No swelling or deformity.     Right lower leg: Edema present.     Comments: Status post left AKA.  Skin:    General: Skin is warm and dry.  Neurological:     General: No focal deficit present.     Mental Status: Mental status is at baseline.    Labs on Admission: I have personally reviewed following labs and imaging studies  CBC: Recent Labs  Lab 04/18/23 0756 04/22/23 0811  WBC 5.8 7.1  HGB 7.5* 7.5*  HCT 24.1* 24.0*  MCV 89.3 89.6  PLT 123* 108*    Basic Metabolic Panel: Recent Labs  Lab 04/18/23 0756 04/22/23 0811  NA 134* 131*  K 3.6 4.1  CL 92* 94*  CO2 25 26  GLUCOSE 246* 527*  BUN 71* 87*  CREATININE 5.27* 4.72*  CALCIUM 8.2* 7.9*  PHOS 5.3* 4.3    GFR: Estimated Creatinine Clearance: 20.7 mL/min (A) (by C-G formula based on SCr of 4.72 mg/dL (H)).  Liver Function Tests: Recent Labs  Lab 04/18/23 0756 04/22/23 0811  ALBUMIN 3.3* 3.3*    Urine analysis:    Component Value Date/Time   COLORURINE STRAW (A) 11/07/2021 0033   APPEARANCEUR CLEAR 11/07/2021 0033   LABSPEC 1.006 11/07/2021 0033   PHURINE 5.0 11/07/2021 0033   GLUCOSEU NEGATIVE 11/07/2021 0033   HGBUR NEGATIVE 11/07/2021 0033   BILIRUBINUR NEGATIVE  11/07/2021 0033   KETONESUR NEGATIVE 11/07/2021 0033   PROTEINUR NEGATIVE 11/07/2021 0033   NITRITE NEGATIVE 11/07/2021 0033   LEUKOCYTESUR NEGATIVE 11/07/2021 0033    Radiological Exams on Admission: DG Chest 2 View  Result Date: 04/22/2023 CLINICAL DATA:  Dyspnea on exertion. EXAM: CHEST - 2 VIEW COMPARISON:  Chest x-ray dated Apr 18, 2023. FINDINGS: Unchanged tunneled left internal jugular dialysis catheter with tip in the proximal right atrium. Stable cardiomediastinal silhouette. Right brachiocephalic vein stent again noted. Normal pulmonary vascularity. No focal consolidation, pleural effusion, or pneumothorax. No acute osseous abnormality. IMPRESSION: 1. No acute cardiopulmonary disease. Electronically Signed   By: Obie Dredge M.D.   On: 04/22/2023 08:59    EKG: Independently reviewed.  Sinus tachycardia 103 bpm.  Assessment/Plan Active Problems:   ESRD on dialysis (HCC)   End stage renal disease (HCC)   COPD (chronic obstructive pulmonary disease) (HCC)   Type 2 diabetes mellitus with hyperlipidemia (HCC)   PTSD (post-traumatic stress disorder)   Mood disorder (HCC)   Chronic diastolic heart failure (HCC)   Diabetic polyneuropathy associated with type 2 diabetes mellitus (HCC)   Gastroesophageal reflux disease without esophagitis   Iron deficiency anemia, unspecified   Mild episode of recurrent major depressive disorder (HCC)   Mixed hyperlipidemia   OSA on CPAP   S/P AKA (above knee amputation), left (HCC)   Benign prostatic hyperplasia with weak urinary stream   Accelerated hypertension   Hyperglycemia > Patient noted to have significant hyperglycemia in the ED greater than 500. > Known history of brittle diabetes requiring at least 70 units of U-500 twice daily outpatient to control blood sugars. > States he has been taking his medication as prescribed (70-125U of U-500 BID).  Reports his diet. > Given his known  history of brittle diabetes patient ordered insulin  drip in the ED, that this does not appear to been started prior to her transferring to dialysis.  Request for observation overnight to correct hyperglycemia and prevent worsening hyperglycemic crisis. - Monitor on progressive unit - Initiate insulin drip soon as possible - Discontinue fluids as he is, and volume overloaded - Stat CBG now and then as needed based on Endo tool protocol - Daily BMP - When blood sugar consistently controlled, will transition back to U-500 at at 70 units twice daily  ESRD on HD Volume overload > Presenting with dyspnea on exertion  after missing dialysis session on Thursday.  As per HPI he dialyzes by coming to the ED currently does not have dialysis center. > Left tunneled IJ waiting for AV graft to mature.  > Seen by nephrology in the ED who have taken patient for dialysis. > Was on 3 to 4 L of oxygen in the ED. - Appreciate nephrology recommendations and assistance - Trend renal function and electrolytes - Wean oxygen as tolerated - Continue home torsemide and metolazone - Continue home PhosLo  Neuropathy - Continue Lyrica  OSA - CPAP nightly  Anemia > Of chronic kidney disease, hemoglobin stable at 7.5. - Trend CBC  GERD - Continue home PPI  Diastolic CHF > Last echo in 2022 with EF 60-65%, normal diastolic function, normal RV function. - Is on diuretics but this appears to be more for his ESRD and intermittent missing of dialysis sessions. - Continue home torsemide and metolazone  Hypertension - Continue home amlodipine, carvedilol, torsemide, metolazone  Hyperlipidemia - Continue home atorvastatin  PAD > Status post left AKA - Continue home aspirin, Plavix, atorvastatin   Depression PTSD - Continue home sertraline, nortriptyline, trazodone, as needed diazepam  BPH - Continue home tamsulosin  DVT prophylaxis: Heparin Code Status:   Full Family Communication:  None on admission  Disposition Plan:   Patient is  from:  Home  Anticipated DC to:  Home  Anticipated DC date:  1 to 2 days  Anticipated DC barriers: None  Consults called:  Nephrology Admission status:  Observation, progressive  Severity of Illness: The appropriate patient status for this patient is OBSERVATION. Observation status is judged to be reasonable and necessary in order to provide the required intensity of service to ensure the patient's safety. The patient's presenting symptoms, physical exam findings, and initial radiographic and laboratory data in the context of their medical condition is felt to place them at decreased risk for further clinical deterioration. Furthermore, it is anticipated that the patient will be medically stable for discharge from the hospital within 2 midnights of admission.   Synetta Fail MD [facility]  How to contact the [facility] Attending or Consulting provider 7A - 7P or covering provider during after hours 7P -7A, for this patient?   Check the care team in [facility] and look for a) attending/consulting [facility] provider listed and b) the [facility] team listed Log into [email or web address] and use [place]'s universal password to access. If you do not have the password, please contact the hospital operator. Locate the [facility] provider you are looking for under [facility] and page to a number that you can be directly reached. If you still have difficulty reaching the provider, please page the [facility] (Director on Call) for the Hospitalists listed on amion for assistance.  04/22/2023, 12:27 PM

## 2023-04-23 DIAGNOSIS — D509 Iron deficiency anemia, unspecified: Secondary | ICD-10-CM | POA: Diagnosis present

## 2023-04-23 DIAGNOSIS — Z7902 Long term (current) use of antithrombotics/antiplatelets: Secondary | ICD-10-CM | POA: Diagnosis not present

## 2023-04-23 DIAGNOSIS — E782 Mixed hyperlipidemia: Secondary | ICD-10-CM | POA: Diagnosis present

## 2023-04-23 DIAGNOSIS — E1122 Type 2 diabetes mellitus with diabetic chronic kidney disease: Secondary | ICD-10-CM | POA: Diagnosis present

## 2023-04-23 DIAGNOSIS — E1165 Type 2 diabetes mellitus with hyperglycemia: Secondary | ICD-10-CM | POA: Diagnosis present

## 2023-04-23 DIAGNOSIS — I132 Hypertensive heart and chronic kidney disease with heart failure and with stage 5 chronic kidney disease, or end stage renal disease: Secondary | ICD-10-CM | POA: Diagnosis present

## 2023-04-23 DIAGNOSIS — I10 Essential (primary) hypertension: Secondary | ICD-10-CM | POA: Diagnosis not present

## 2023-04-23 DIAGNOSIS — N2581 Secondary hyperparathyroidism of renal origin: Secondary | ICD-10-CM | POA: Diagnosis present

## 2023-04-23 DIAGNOSIS — Z992 Dependence on renal dialysis: Secondary | ICD-10-CM

## 2023-04-23 DIAGNOSIS — K219 Gastro-esophageal reflux disease without esophagitis: Secondary | ICD-10-CM

## 2023-04-23 DIAGNOSIS — Z87891 Personal history of nicotine dependence: Secondary | ICD-10-CM | POA: Diagnosis not present

## 2023-04-23 DIAGNOSIS — J449 Chronic obstructive pulmonary disease, unspecified: Secondary | ICD-10-CM | POA: Diagnosis not present

## 2023-04-23 DIAGNOSIS — Z89612 Acquired absence of left leg above knee: Secondary | ICD-10-CM | POA: Diagnosis not present

## 2023-04-23 DIAGNOSIS — E1142 Type 2 diabetes mellitus with diabetic polyneuropathy: Secondary | ICD-10-CM | POA: Diagnosis present

## 2023-04-23 DIAGNOSIS — E1169 Type 2 diabetes mellitus with other specified complication: Secondary | ICD-10-CM | POA: Diagnosis present

## 2023-04-23 DIAGNOSIS — D631 Anemia in chronic kidney disease: Secondary | ICD-10-CM | POA: Diagnosis present

## 2023-04-23 DIAGNOSIS — F330 Major depressive disorder, recurrent, mild: Secondary | ICD-10-CM | POA: Diagnosis present

## 2023-04-23 DIAGNOSIS — N186 End stage renal disease: Secondary | ICD-10-CM | POA: Diagnosis not present

## 2023-04-23 DIAGNOSIS — N250 Renal osteodystrophy: Secondary | ICD-10-CM | POA: Diagnosis present

## 2023-04-23 DIAGNOSIS — Z6841 Body Mass Index (BMI) 40.0 and over, adult: Secondary | ICD-10-CM | POA: Diagnosis not present

## 2023-04-23 DIAGNOSIS — Z79899 Other long term (current) drug therapy: Secondary | ICD-10-CM | POA: Diagnosis not present

## 2023-04-23 DIAGNOSIS — Z794 Long term (current) use of insulin: Secondary | ICD-10-CM | POA: Diagnosis not present

## 2023-04-23 DIAGNOSIS — E1151 Type 2 diabetes mellitus with diabetic peripheral angiopathy without gangrene: Secondary | ICD-10-CM | POA: Diagnosis present

## 2023-04-23 DIAGNOSIS — Z7982 Long term (current) use of aspirin: Secondary | ICD-10-CM | POA: Diagnosis not present

## 2023-04-23 DIAGNOSIS — I5033 Acute on chronic diastolic (congestive) heart failure: Secondary | ICD-10-CM | POA: Diagnosis present

## 2023-04-23 LAB — GLUCOSE, CAPILLARY
Glucose-Capillary: 142 mg/dL — ABNORMAL HIGH (ref 70–99)
Glucose-Capillary: 145 mg/dL — ABNORMAL HIGH (ref 70–99)
Glucose-Capillary: 146 mg/dL — ABNORMAL HIGH (ref 70–99)
Glucose-Capillary: 149 mg/dL — ABNORMAL HIGH (ref 70–99)
Glucose-Capillary: 154 mg/dL — ABNORMAL HIGH (ref 70–99)
Glucose-Capillary: 163 mg/dL — ABNORMAL HIGH (ref 70–99)
Glucose-Capillary: 184 mg/dL — ABNORMAL HIGH (ref 70–99)
Glucose-Capillary: 225 mg/dL — ABNORMAL HIGH (ref 70–99)
Glucose-Capillary: 245 mg/dL — ABNORMAL HIGH (ref 70–99)
Glucose-Capillary: 359 mg/dL — ABNORMAL HIGH (ref 70–99)

## 2023-04-23 LAB — CBC
HCT: 25.9 % — ABNORMAL LOW (ref 39.0–52.0)
Hemoglobin: 8 g/dL — ABNORMAL LOW (ref 13.0–17.0)
MCH: 28.1 pg (ref 26.0–34.0)
MCHC: 30.9 g/dL (ref 30.0–36.0)
MCV: 90.9 fL (ref 80.0–100.0)
Platelets: 117 10*3/uL — ABNORMAL LOW (ref 150–400)
RBC: 2.85 MIL/uL — ABNORMAL LOW (ref 4.22–5.81)
RDW: 14.6 % (ref 11.5–15.5)
WBC: 7.5 10*3/uL (ref 4.0–10.5)
nRBC: 0 % (ref 0.0–0.2)

## 2023-04-23 LAB — BASIC METABOLIC PANEL
Anion gap: 11 (ref 5–15)
BUN: 50 mg/dL — ABNORMAL HIGH (ref 8–23)
CO2: 27 mmol/L (ref 22–32)
Calcium: 7.9 mg/dL — ABNORMAL LOW (ref 8.9–10.3)
Chloride: 98 mmol/L (ref 98–111)
Creatinine, Ser: 3.49 mg/dL — ABNORMAL HIGH (ref 0.61–1.24)
GFR, Estimated: 19 mL/min — ABNORMAL LOW (ref >60)
Glucose, Bld: 153 mg/dL — ABNORMAL HIGH (ref 70–99)
Potassium: 3.6 mmol/L (ref 3.5–5.1)
Sodium: 136 mmol/L (ref 135–145)

## 2023-04-23 MED ORDER — ALTEPLASE 2 MG IJ SOLR: 2.0000 mg | Freq: Once | INTRAMUSCULAR | Status: DC | PRN

## 2023-04-23 MED ORDER — INSULIN ASPART 100 UNIT/ML IJ SOLN
0.0000 [IU] | Freq: Three times a day (TID) | INTRAMUSCULAR | Status: DC
  Administered 2023-04-23: 5 [IU] via SUBCUTANEOUS
  Administered 2023-04-23: 15 [IU] via SUBCUTANEOUS
  Administered 2023-04-24 (×2): 5 [IU] via SUBCUTANEOUS
  Administered 2023-04-24: 8 [IU] via SUBCUTANEOUS

## 2023-04-23 MED ORDER — ANTICOAGULANT SODIUM CITRATE 4% (200MG/5ML) IV SOLN: 5.0000 mL | Status: DC | PRN

## 2023-04-23 MED ORDER — INSULIN GLARGINE-YFGN 100 UNIT/ML ~~LOC~~ SOLN
50.0000 [IU] | Freq: Every day | SUBCUTANEOUS | Status: DC
  Filled 2023-04-23 (×2): qty 0.5

## 2023-04-23 MED ORDER — DARBEPOETIN ALFA 60 MCG/0.3ML IJ SOSY
60.0000 ug | PREFILLED_SYRINGE | Freq: Once | INTRAMUSCULAR | Status: DC
  Filled 2023-04-23: qty 0.3

## 2023-04-23 MED ORDER — INSULIN REGULAR HUMAN (CONC) 500 UNIT/ML ~~LOC~~ SOPN
70.0000 [IU] | PEN_INJECTOR | Freq: Two times a day (BID) | SUBCUTANEOUS | Status: DC
  Administered 2023-04-23 – 2023-04-24 (×4): 70 [IU] via SUBCUTANEOUS
  Filled 2023-04-23: qty 3

## 2023-04-23 MED ORDER — HEPARIN SODIUM (PORCINE) 1000 UNIT/ML DIALYSIS
1000.0000 [IU] | INTRAMUSCULAR | Status: DC | PRN
  Filled 2023-04-23: qty 1

## 2023-04-23 MED ORDER — INSULIN ASPART 100 UNIT/ML IJ SOLN
0.0000 [IU] | Freq: Three times a day (TID) | INTRAMUSCULAR | Status: DC
  Administered 2023-04-23: 5 [IU] via SUBCUTANEOUS

## 2023-04-23 MED ORDER — HEPARIN SODIUM (PORCINE) 1000 UNIT/ML DIALYSIS
5000.0000 [IU] | INTRAMUSCULAR | Status: DC | PRN
  Administered 2023-04-23: 5000 [IU] via INTRAVENOUS_CENTRAL
  Filled 2023-04-23 (×2): qty 5

## 2023-04-23 MED ORDER — INSULIN ASPART 100 UNIT/ML IJ SOLN: 0.0000 [IU] | Freq: Every day | INTRAMUSCULAR | Status: DC

## 2023-04-23 NOTE — Progress Notes (Signed)
PROGRESS NOTE        PATIENT DETAILS Name: Jeffrey [name] Age: 62 y.o. Sex: male Date of Birth: Jan 21, 1961 Admit Date: 04/22/2023 Admitting Physician Synetta Fail, MD ZOX:[id number], Modena Slater, MD  Brief Summary: Patient is a 62 y.o.  male with history of ESRD, PAD s/p left AKA, DM-2, HFpEF, COPD who presented with volume overload in the setting of missed HD and uncontrolled hyperglycemia requiring insulin infusion.  Significant events: 5/25>> admit to [facility].  Significant studies: 5/25>> CXR: No PNA  Significant microbiology data: None  Procedures: None  Consults: Nephrology.  Subjective: Lying comfortably in bed-denies any chest pain or shortness of breath.  Main issue is that he thinks he has excessive edema in his legs/arms.  Objective: Vitals: Blood pressure (!) 136/46, pulse 99, temperature 98.6 F (37 C), temperature source Oral, resp. rate (!) 29, height 5\' 5"  (1.651 m), weight 136.1 kg, SpO2 96 %.   Exam: Gen Exam:Alert awake-not in any distress HEENT:atraumatic, normocephalic Chest: B/L clear to auscultation anteriorly CVS:S1S2 regular Abdomen:soft non tender, non distended Extremities: 1+ edema-s/p left AKA Neurology: Non focal Skin: no rash  Pertinent Labs/Radiology:    Latest Ref Rng & Units 04/23/2023    8:41 AM 04/22/2023    8:11 AM 04/18/2023    7:56 AM  CBC  WBC 4.0 - 10.5 K/uL 7.5  7.1  5.8   Hemoglobin 13.0 - 17.0 g/dL 8.0  7.5  7.5   Hematocrit 39.0 - 52.0 % 25.9  24.0  24.1   Platelets 150 - 400 K/uL 117  108  123     Lab Results  Component Value Date   NA 136 04/23/2023   K 3.6 04/23/2023   CL 98 04/23/2023   CO2 27 04/23/2023      Assessment/Plan: Volume overload/acute on chronic HFpEF in the setting of missed HD (no set outpatient HD center but usually follows TTS schedule-unfortunately comes to the ED for HD due to prior threatening behavior to outpatient HD staff) S/p HD yesterday-remains volume  overloaded Discussed with nephrologist-Dr. Patel-repeat HD tomorrow.  Hyperglycemic hyperosmolar nonketotic state Stopping IV insulin infusion-and transitioning to SQ insulin  DM-2 (A1c 10.5 on 11/25/2022) with uncontrolled hyperglycemia Starting 70 units twice daily of Humulin R U-500 twice daily Follow CBGs  Normocytic anemia Secondary to ESRD Adna/iron per nephrology service  HTN BP reasonable Continue amlodipine/Coreg/torsemide/metolazone  HLD Statin  PAD-s/p left AKA Antiplatelet/statin  Peripheral neuropathy Lyrica  BPH Flomax  Depression PTSD Stable Zoloft/nortriptyline/trazodone and as needed diazepam  OSA CPAP nightly  Morbid Obesity: Estimated body mass index is 49.92 kg/m as calculated from the following:   Height as of this encounter: 5\' 5"  (1.651 m).   Weight as of this encounter: 136.1 kg.   Code status:   Code Status: Full Code   DVT Prophylaxis: heparin injection 5,000 Units Start: 04/22/23 2200   Family Communication: None at bedside   Disposition Plan: Status is: Observation The patient will require care spanning > 2 midnights and should be moved to inpatient because: Severity of illness   Planned Discharge Destination:Home   Diet: Diet Order             Diet renal/carb modified with fluid restriction Diet-HS Snack? Nothing; Fluid restriction: 1200 mL Fluid; Room service appropriate? Yes; Fluid consistency: Thin  Diet effective now  Antimicrobial agents: Anti-infectives (From admission, onward)    None        MEDICATIONS: Scheduled Meds:  amLODipine  10 mg Oral q morning   atorvastatin  40 mg Oral q AM   calcium acetate  1,334 mg Oral TID with meals   carvedilol  12.5 mg Oral BID WC   Chlorhexidine Gluconate Cloth  6 each Topical Q0600   darbepoetin (ARANESP) injection - DIALYSIS  60 mcg Subcutaneous Once   heparin  5,000 Units Subcutaneous Q8H   heparin sodium (porcine)  4,200 Units  Intracatheter Once   insulin aspart  0-15 Units Subcutaneous TID WC   insulin aspart  0-5 Units Subcutaneous QHS   insulin regular human CONCENTRATED  70 Units Subcutaneous BID WC   metolazone  10 mg Oral Daily   nortriptyline  20 mg Oral QHS   pantoprazole  40 mg Oral QAC breakfast   [START ON 04/25/2023] pregabalin  75 mg Oral Q T,Th,Sat-1800   sertraline  100 mg Oral q AM   tamsulosin  0.4 mg Oral Daily   torsemide  120 mg Oral BID   traZODone  150 mg Oral QHS   Continuous Infusions:  anticoagulant sodium citrate     PRN Meds:.acetaminophen, alteplase, anticoagulant sodium citrate, dextrose, diazepam, heparin, heparin, heparin, lidocaine (PF), lidocaine-prilocaine, pentafluoroprop-tetrafluoroeth   I have personally reviewed following labs and imaging studies  LABORATORY DATA: CBC: Recent Labs  Lab 04/18/23 0756 04/22/23 0811 04/23/23 0841  WBC 5.8 7.1 7.5  HGB 7.5* 7.5* 8.0*  HCT 24.1* 24.0* 25.9*  MCV 89.3 89.6 90.9  PLT 123* 108* 117*    Basic Metabolic Panel: Recent Labs  Lab 04/18/23 0756 04/22/23 0811 04/23/23 0359  NA 134* 131* 136  K 3.6 4.1 3.6  CL 92* 94* 98  CO2 25 26 27   GLUCOSE 246* 527* 153*  BUN 71* 87* 50*  CREATININE 5.27* 4.72* 3.49*  CALCIUM 8.2* 7.9* 7.9*  PHOS 5.3* 4.3  --     GFR: Estimated Creatinine Clearance: 28.3 mL/min (A) (by C-G formula based on SCr of 3.49 mg/dL (H)).  Liver Function Tests: Recent Labs  Lab 04/18/23 0756 04/22/23 0811  ALBUMIN 3.3* 3.3*   No results for input(s): "LIPASE", "AMYLASE" in the last 168 hours. No results for input(s): "AMMONIA" in the last 168 hours.  Coagulation Profile: No results for input(s): "INR", "PROTIME" in the last 168 hours.  Cardiac Enzymes: No results for input(s): "CKTOTAL", "CKMB", "CKMBINDEX", "TROPONINI" in the last 168 hours.  BNP (last 3 results) No results for input(s): "PROBNP" in the last 8760 hours.  Lipid Profile: No results for input(s): "CHOL", "HDL",  "LDLCALC", "TRIG", "CHOLHDL", "LDLDIRECT" in the last 72 hours.  Thyroid Function Tests: No results for input(s): "TSH", "T4TOTAL", "FREET4", "T3FREE", "THYROIDAB" in the last 72 hours.  Anemia Panel: No results for input(s): "VITAMINB12", "FOLATE", "FERRITIN", "TIBC", "IRON", "RETICCTPCT" in the last 72 hours.  Urine analysis:    Component Value Date/Time   COLORURINE STRAW (A) 11/07/2021 0033   APPEARANCEUR CLEAR 11/07/2021 0033   LABSPEC 1.006 11/07/2021 0033   PHURINE 5.0 11/07/2021 0033   GLUCOSEU NEGATIVE 11/07/2021 0033   HGBUR NEGATIVE 11/07/2021 0033   BILIRUBINUR NEGATIVE 11/07/2021 0033   KETONESUR NEGATIVE 11/07/2021 0033   PROTEINUR NEGATIVE 11/07/2021 0033   NITRITE NEGATIVE 11/07/2021 0033   LEUKOCYTESUR NEGATIVE 11/07/2021 0033    Sepsis Labs: Lactic Acid, Venous    Component Value Date/Time   LATICACIDVEN 1.1 11/24/2022 1414    MICROBIOLOGY: No results  found for this or any previous visit (from the past 240 hour(s)).  RADIOLOGY STUDIES/RESULTS: DG Chest 2 View  Result Date: 04/22/2023 CLINICAL DATA:  Dyspnea on exertion. EXAM: CHEST - 2 VIEW COMPARISON:  Chest x-ray dated Apr 18, 2023. FINDINGS: Unchanged tunneled left internal jugular dialysis catheter with tip in the proximal right atrium. Stable cardiomediastinal silhouette. Right brachiocephalic vein stent again noted. Normal pulmonary vascularity. No focal consolidation, pleural effusion, or pneumothorax. No acute osseous abnormality. IMPRESSION: 1. No acute cardiopulmonary disease. Electronically Signed   By: Obie Dredge M.D.   On: 04/22/2023 08:59     LOS: 0 days   Jeoffrey Massed, MD  [facility]    To contact the attending provider between 7A-7P or the covering provider during after hours 7P-7A, please log into the web site [email or web address] and access using universal [place] password for that web site. If you do not have the password, please call the hospital operator.  04/23/2023,  12:37 PM

## 2023-04-23 NOTE — Progress Notes (Signed)
[name] Progress Note   Subjective:  Jeffrey [name] was admitted under observation for hyperglycemia.  He remains without an outpatient dialysis center and comes to the ED for dialysis.  Last dialysis was Tuesday 5/21. Presented for dialysis yesterday and found to have glucose >500. Insulin gtt started. He did receive dialysis Saturday with 2L removed. Dialysis via L TDC. He has new LUE AVG placed 04/07/23 by Dr. Edilia Bo.   This morning he is off insulin drip and eating breakfast. Still feels like his breathing is "labored" and has swelling.    Objective Vitals:   04/22/23 2020 04/22/23 2235 04/23/23 0553 04/23/23 0800  BP: (!) 138/58  (!) 141/70 (!) 136/46  Pulse: 100 99 92   Resp: 15 16 18    Temp: 98.8 F (37.1 C)  97.8 F (36.6 C) 98.6 F (37 C)  TempSrc: Oral  Oral Oral  SpO2: 96%  99%   Weight:      Height:          Additional Objective Labs: Basic Metabolic Panel: Recent Labs  Lab 04/18/23 0756 04/22/23 0811 04/23/23 0359  NA 134* 131* 136  K 3.6 4.1 3.6  CL 92* 94* 98  CO2 25 26 27   GLUCOSE 246* 527* 153*  BUN 71* 87* 50*  CREATININE 5.27* 4.72* 3.49*  CALCIUM 8.2* 7.9* 7.9*  PHOS 5.3* 4.3  --    CBC: Recent Labs  Lab 04/18/23 0756 04/22/23 0811 04/23/23 0841  WBC 5.8 7.1 7.5  HGB 7.5* 7.5* 8.0*  HCT 24.1* 24.0* 25.9*  MCV 89.3 89.6 90.9  PLT 123* 108* 117*   Blood Culture    Component Value Date/Time   SDES BLOOD LEFT ARM 11/25/2022 0218   SPECREQUEST  11/25/2022 0218    BOTTLES DRAWN AEROBIC AND ANAEROBIC Blood Culture results may not be optimal due to an excessive volume of blood received in culture bottles   CULT  11/25/2022 0218    NO GROWTH 5 DAYS Performed at [facility], [address] [place], [place] [address]    REPTSTATUS 11/30/2022 FINAL 11/25/2022 0218     Physical Exam General: Sitting up in bed, on nasal oxygen Heart: RRR Lungs: Clear bilaterally  Abdomen: soft non-tender  Extremities: 1+ edema  LE  Dialysis Access: L TDC in place   Medications:   amLODipine  10 mg Oral q morning   atorvastatin  40 mg Oral q AM   calcium acetate  1,334 mg Oral TID with meals   carvedilol  12.5 mg Oral BID WC   Chlorhexidine Gluconate Cloth  6 each Topical Q0600   heparin  5,000 Units Subcutaneous Q8H   heparin sodium (porcine)  4,200 Units Intracatheter Once   insulin aspart  0-15 Units Subcutaneous TID WC   insulin aspart  0-5 Units Subcutaneous QHS   insulin aspart  0-6 Units Subcutaneous TID WC   insulin glargine-yfgn  50 Units Subcutaneous Daily   insulin regular human CONCENTRATED  70 Units Subcutaneous BID WC   metolazone  10 mg Oral Daily   nortriptyline  20 mg Oral QHS   pantoprazole  40 mg Oral QAC breakfast   [START ON 04/25/2023] pregabalin  75 mg Oral Q T,Th,Sat-1800   sertraline  100 mg Oral q AM   tamsulosin  0.4 mg Oral Daily   torsemide  120 mg Oral BID   traZODone  150 mg Oral QHS      Assessment/Plan:  Hyperglycemia: Presented with glucose >500. Insulin per admitting  team. History of brittle diabetes    ESRD:  On TTS schedule. Comes to ED for dialysis. Missed Thursday. Had dialysis Sat. Plan for HD today if nurse available to make up missed treatment and extra UF    Hypertension/volume: BP elevated and has edema on exam. UFG 3L today as tolerated.   Anemia: Hgb 8.0- Will order Aranesp 60 today   Metabolic bone disease: Ca/Phos acceptable. Continue calcium acetate.   Nutrition:  Renal/carb diet with fluid restriction   Tomasa Blase PA-C [facility] 04/23/2023,10:06 AM

## 2023-04-23 NOTE — Progress Notes (Signed)
   04/23/23 1700  Vitals  Temp 98.4 F (36.9 C)  Pulse Rate 96  Resp 20  BP (!) 148/78  SpO2 98 %  O2 Device Nasal Cannula  Type of Weight Post-Dialysis  Oxygen Therapy  O2 Flow Rate (L/min) 3 L/min  Patient Activity (if Appropriate) In bed  Pulse Oximetry Type Continuous  Oximetry Probe Site Changed No  Post Treatment  Dialyzer Clearance Lightly streaked  Duration of HD Treatment -hour(s) 3 hour(s)  Hemodialysis Intake (mL) 0 mL  Liters Processed 1.88  Fluid Removed (mL) 3500 mL  Tolerated HD Treatment Yes   Received patient in bed to unit.  Alert and oriented.  Informed consent signed and in chart.   TX duration:3.0  Patient tolerated well.  Transported back to the room  Alert, without acute distress.  Hand-off given to patient's nurse.   Access used: LDLC Access issues: no complications  Total UF removed: 3500 Medication(s) given: none   [facility]

## 2023-04-24 DIAGNOSIS — N186 End stage renal disease: Secondary | ICD-10-CM | POA: Diagnosis not present

## 2023-04-24 DIAGNOSIS — I10 Essential (primary) hypertension: Secondary | ICD-10-CM

## 2023-04-24 DIAGNOSIS — E1165 Type 2 diabetes mellitus with hyperglycemia: Secondary | ICD-10-CM | POA: Diagnosis not present

## 2023-04-24 DIAGNOSIS — J449 Chronic obstructive pulmonary disease, unspecified: Secondary | ICD-10-CM

## 2023-04-24 LAB — BASIC METABOLIC PANEL
Anion gap: 11 (ref 5–15)
BUN: 62 mg/dL — ABNORMAL HIGH (ref 8–23)
CO2: 28 mmol/L (ref 22–32)
Calcium: 8 mg/dL — ABNORMAL LOW (ref 8.9–10.3)
Chloride: 96 mmol/L — ABNORMAL LOW (ref 98–111)
Creatinine, Ser: 4.45 mg/dL — ABNORMAL HIGH (ref 0.61–1.24)
GFR, Estimated: 14 mL/min — ABNORMAL LOW (ref >60)
Glucose, Bld: 213 mg/dL — ABNORMAL HIGH (ref 70–99)
Potassium: 3.6 mmol/L (ref 3.5–5.1)
Sodium: 135 mmol/L (ref 135–145)

## 2023-04-24 LAB — GLUCOSE, CAPILLARY
Glucose-Capillary: 216 mg/dL — ABNORMAL HIGH (ref 70–99)
Glucose-Capillary: 263 mg/dL — ABNORMAL HIGH (ref 70–99)
Glucose-Capillary: 213 mg/dL — ABNORMAL HIGH (ref 70–99)

## 2023-04-24 MED ORDER — HEPARIN SODIUM (PORCINE) 1000 UNIT/ML IJ SOLN
INTRAMUSCULAR | Status: AC
  Administered 2023-04-24: 1000 [IU]
  Filled 2023-04-24: qty 4

## 2023-04-24 MED ORDER — DARBEPOETIN ALFA 60 MCG/0.3ML IJ SOSY
60.0000 ug | PREFILLED_SYRINGE | Freq: Once | INTRAMUSCULAR | Status: AC
  Administered 2023-04-24: 60 ug via SUBCUTANEOUS
  Filled 2023-04-24: qty 0.3

## 2023-04-24 NOTE — TOC Transition Note (Signed)
Transition of Care [facility]) - CM/SW Discharge Note   Patient Details  Name: Jeffrey [name] MRN: [id number] Date of Birth: 04-26-1961  Transition of Care [facility]) CM/SW Contact:  Tom-Johnson, Hershal Coria, RN Phone Number: 04/24/2023, 4:37 PM   Clinical Narrative:     Patient is scheduled for discharge today.  Readmission Risk Assessment done. Outpatient f/u, hospital f/u and discharge instructions on AVS. [facility] scheduled for transport at discharge after dialysis for 7 pm.  No further TOC needs noted.        Final next level of care: Home/Self Care Barriers to Discharge: Barriers Resolved   Patient Goals and CMS Choice CMS Medicare.gov Compare Post Acute Care list provided to:: Patient Choice offered to / list presented to : Patient  Discharge Placement                  Patient to be transferred to facility by: [facility]      Discharge Plan and Services Additional resources added to the After Visit Summary for                  DME Arranged: N/A DME Agency: NA       HH Arranged: NA HH Agency: NA        Social Determinants of Health (SDOH) Interventions SDOH Screenings   Food Insecurity: No Food Insecurity (04/22/2023)  Housing: Low Risk  (04/22/2023)  Transportation Needs: No Transportation Needs (04/22/2023)  Utilities: Not At Risk (04/22/2023)  Tobacco Use: Medium Risk (04/18/2023)     Readmission Risk Interventions    04/24/2023    4:36 PM 11/03/2022   10:19 AM 04/07/2021    3:59 PM  Readmission Risk Prevention Plan  Transportation Screening Complete Complete Complete  Medication Review (RN Care Manager) Referral to Pharmacy Complete Referral to Pharmacy  PCP or Specialist appointment within 3-5 days of discharge Complete  Complete  HRI or Home Care Consult Complete Complete Complete  SW Recovery Care/Counseling Consult Complete Complete Complete  Palliative Care Screening Not Applicable Not Applicable Not Applicable  Skilled Nursing Facility Not  Applicable Not Applicable Complete

## 2023-04-24 NOTE — Procedures (Signed)
I was present at this dialysis session, have reviewed the session and made  appropriate changes Vinson Moselle MD  [facility] 04/24/2023, 5:00 PM

## 2023-04-24 NOTE — TOC Progression Note (Signed)
Transition of Care [facility]) - Progression Note    Patient Details  Name: Jeffrey [name] MRN: [id number] Date of Birth: [birth year]  [facility]) CM/SW Contact  Tom-Johnson, Hershal Coria, RN Phone Number: 04/24/2023, 5:05 PM  Clinical Narrative:         Barriers to Discharge: Barriers Resolved  Expected Discharge Plan and Services   Discharge scheduled for today. Patient is currently in dialysis. CM called [facility] to schedule transportation and spoke with Ms. Dreamer who states patient lives 59 miles from [place] which is over 50 miles standard for insurance to pay. Patient will have to pay $1600 out of pocket. CM called [facility], [place] and each does not have a driver after 6 pm to transport to [facility]. CM notified MD and discharge canceled till tomorrow.  CM will continue to follow.       Expected Discharge Date: 04/24/23               DME Arranged: N/A DME Agency: NA       HH Arranged: NA HH Agency: NA         Social Determinants of Health (SDOH) Interventions SDOH Screenings   Food Insecurity: No Food Insecurity (04/22/2023)  Housing: Low Risk  (04/22/2023)  Transportation Needs: No Transportation Needs (04/22/2023)  Utilities: Not At Risk (04/22/2023)  Tobacco Use: Medium Risk (04/18/2023)    Readmission Risk Interventions    04/24/2023    4:36 PM 11/03/2022   10:19 AM 04/07/2021    3:59 PM  Readmission Risk Prevention Plan  Transportation Screening Complete Complete Complete  Medication Review (RN Care Manager) Referral to Pharmacy Complete Referral to Pharmacy  PCP or Specialist appointment within 3-5 days of discharge Complete  Complete  HRI or Home Care Consult Complete Complete Complete  SW Recovery Care/Counseling Consult Complete Complete Complete  Palliative Care Screening Not Applicable Not Applicable Not Applicable  Skilled Nursing Facility Not Applicable Not Applicable Complete

## 2023-04-24 NOTE — Progress Notes (Addendum)
Received patient in bed to unit.  Alert and oriented.  Informed consent signed and in chart.   TX duration:3h.   Patient tolerated well.  Transported back to the room  Alert, without acute distress.  Hand-off given to patient's nurse.   Access used: catheter Access issues: none  Total UF removed: 2.3 L Medication(s) given: none Post HD VS: 137/70 P 90 R 16 O2 sat 100 % in 3 L O2 using. Post HD weight: 133.2kg   [facility]

## 2023-04-24 NOTE — Discharge Summary (Signed)
PATIENT DETAILS Name: Jeffrey [name] Age: 62 y.o. Sex: male Date of Birth: Mar 19, 1961 MRN: [id number]. Admitting Physician: Synetta Fail, MD [id number], Modena Slater, MD  Admit Date: 04/22/2023 Discharge date: 04/24/2023  Recommendations for Outpatient Follow-up:  Follow up with PCP in 1-2 weeks  Admitted From:  Home  Disposition: Home   Discharge Condition: good  CODE STATUS:   Code Status: Full Code   Diet recommendation:  Diet Order             Diet - low sodium heart healthy           Diet Carb Modified           Diet renal/carb modified with fluid restriction Diet-HS Snack? Nothing; Fluid restriction: 1200 mL Fluid; Room service appropriate? Yes; Fluid consistency: Thin  Diet effective now                    Brief Summary: Patient is a 62 y.o.  male with history of ESRD, PAD s/p left AKA, DM-2, HFpEF, COPD who presented with volume overload in the setting of missed HD and uncontrolled hyperglycemia requiring insulin infusion.   Significant events: 5/25>> admit to [facility].   Significant studies: 5/25>> CXR: No PNA   Significant microbiology data: None   Procedures: None   Consults: Nephrology.  Brief Hospital Course: Volume overload/acute on chronic HFpEF in the setting of missed HD (no set outpatient HD center but usually follows TTS schedule-unfortunately comes to the ED for HD due to prior threatening behavior to outpatient HD staff) S/p HD on Saturday-plans are to dialyze later today and subsequently discharged home. Nephrology followed closely. Patient instructed to try and keep TTS schedule for HD.   Hyperglycemic hyperosmolar nonketotic state Required IV insulin infusion-has been transitioned to SQ insulin. Resume usual home regimen on discharge.    DM-2 (A1c 10.5 on 11/25/2022) with uncontrolled hyperglycemia Resume usual regimen of Humulin R U-500 twice daily on discharge. PCP to optimize further.   Normocytic anemia Secondary to  ESRD Aranesp/iron per nephrology service   HTN BP reasonable Continue amlodipine/Coreg/torsemide/metolazone   HLD Statin   PAD-s/p left AKA Antiplatelet/statin   Peripheral neuropathy Lyrica   BPH Flomax   Depression PTSD Stable Zoloft/nortriptyline/trazodone and as needed diazepam   OSA CPAP nightly-but refused last night. Counseled regarding compliance.   Morbid Obesity: Estimated body mass index is 49.92 kg/m as calculated from the following:   Height as of this encounter: 5\' 5"  (1.651 m).   Weight as of this encounter: 136.1 kg.   Obesity: Estimated body mass index is 49.93 kg/m as calculated from the following:   Height as of this encounter: 5\' 5"  (1.651 m).   Weight as of this encounter: 136.1 kg.   Discharge Diagnoses:  Principal Problem:   Hyperglycemia due to diabetes mellitus (HCC) Active Problems:   ESRD on dialysis (HCC)   End stage renal disease (HCC)   COPD (chronic obstructive pulmonary disease) (HCC)   Type 2 diabetes mellitus with hyperlipidemia (HCC)   PTSD (post-traumatic stress disorder)   Mood disorder (HCC)   Chronic diastolic heart failure (HCC)   Diabetic polyneuropathy associated with type 2 diabetes mellitus (HCC)   Gastroesophageal reflux disease without esophagitis   Iron deficiency anemia, unspecified   Mild episode of recurrent major depressive disorder (HCC)   Mixed hyperlipidemia   OSA on CPAP   S/P AKA (above knee amputation), left (HCC)   Benign prostatic hyperplasia with weak urinary stream   Accelerated  hypertension   Discharge Instructions:  Activity:  As tolerated with Full fall precautions use walker/cane & assistance as needed  Discharge Instructions     Diet - low sodium heart healthy   Complete by: As directed    Diet Carb Modified   Complete by: As directed    Discharge instructions   Complete by: As directed    Follow with Primary MD  Knox Royalty, MD in 1-2 weeks  Please get a complete blood  count and chemistry panel checked by your Primary MD at your next visit, and again as instructed by your Primary MD.  Get Medicines reviewed and adjusted: Please take all your medications with you for your next visit with your Primary MD  Laboratory/radiological data: Please request your Primary MD to go over all hospital tests and procedure/radiological results at the follow up, please ask your Primary MD to get all Hospital records sent to his/her office.  In some cases, they will be blood work, cultures and biopsy results pending at the time of your discharge. Please request that your primary care M.D. follows up on these results.  Also Note the following: If you experience worsening of your admission symptoms, develop shortness of breath, life threatening emergency, suicidal or homicidal thoughts you must seek medical attention immediately by calling 911 or calling your MD immediately  if symptoms less severe.  You must read complete instructions/literature along with all the possible adverse reactions/side effects for all the Medicines you take and that have been prescribed to you. Take any new Medicines after you have completely understood and accpet all the possible adverse reactions/side effects.   Do not drive when taking Pain medications or sleeping medications (Benzodaizepines)  Do not take more than prescribed Pain, Sleep and Anxiety Medications. It is not advisable to combine anxiety,sleep and pain medications without talking with your primary care practitioner  Special Instructions: If you have smoked or chewed Tobacco  in the last 2 yrs please stop smoking, stop any regular Alcohol  and or any Recreational drug use.  Wear Seat belts while driving.  Please note: You were cared for by a hospitalist during your hospital stay. Once you are discharged, your primary care physician will handle any further medical issues. Please note that NO REFILLS for any discharge medications will  be authorized once you are discharged, as it is imperative that you return to your primary care physician (or establish a relationship with a primary care physician if you do not have one) for your post hospital discharge needs so that they can reassess your need for medications and monitor your lab values.   Increase activity slowly   Complete by: As directed    No wound care   Complete by: As directed       Allergies as of 04/24/2023       Reactions   Actos [pioglitazone] Anaphylaxis, Rash   Dexmedetomidine Nausea And Vomiting   (Precedex) Dose-limiting bradycardia   Ibuprofen Shortness Of Breath, Swelling   Pt tolerates aspirin   Tomato Anaphylaxis   Only allergic to RAW tomatoes   Wellbutrin [bupropion] Swelling        Medication List     TAKE these medications    amLODipine 10 MG tablet Commonly known as: NORVASC Take 10 mg by mouth every morning.   Aspirin Low Dose 81 MG tablet Generic drug: aspirin EC Take 81 mg by mouth in the morning.   atorvastatin 40 MG tablet Commonly known as: LIPITOR Take 40  mg by mouth in the morning.   calcium acetate 667 MG capsule Commonly known as: PHOSLO Take 2 capsules (1,334 mg total) by mouth with breakfast, with lunch, and with evening meal.   calcium carbonate (dosed in mg elemental calcium) 1250 MG/5ML Susp Take 5 mLs (500 mg of elemental calcium total) by mouth every 6 (six) hours as needed for indigestion.   carvedilol 12.5 MG tablet Commonly known as: COREG Take 12.5 mg by mouth 2 (two) times daily with a meal.   clopidogrel 75 MG tablet Commonly known as: PLAVIX Take 1 tablet (75 mg total) by mouth daily.   cyclobenzaprine 10 MG tablet Commonly known as: FLEXERIL Take 10 mg by mouth 3 (three) times daily as needed for muscle spasms.   diazepam 5 MG tablet Commonly known as: VALIUM Take 5 mg by mouth 2 (two) times daily as needed for muscle spasms.   HumuLIN R U-500 KwikPen 500 UNIT/ML KwikPen Generic drug:  insulin regular human CONCENTRATED Inject 45 Units into the skin daily with supper. What changed:  how much to take when to take this   metolazone 10 MG tablet Commonly known as: ZAROXOLYN Take 10 mg by mouth daily.   nortriptyline 10 MG capsule Commonly known as: PAMELOR Take 20 mg by mouth at bedtime.   oxyCODONE 5 MG immediate release tablet Commonly known as: Roxicodone Take 1 tablet (5 mg total) by mouth every 8 (eight) hours as needed. What changed: reasons to take this   pantoprazole 40 MG tablet Commonly known as: PROTONIX Take 40 mg by mouth daily before breakfast.   pregabalin 75 MG capsule Commonly known as: LYRICA Take 1 capsule (75 mg total) by mouth See admin instructions. Daily.  Give after dialysis on dialysis days.   sertraline 100 MG tablet Commonly known as: ZOLOFT Take 100 mg by mouth in the morning.   tamsulosin 0.4 MG Caps capsule Commonly known as: FLOMAX Take 1 capsule (0.4 mg total) by mouth daily.   torsemide 20 MG tablet Commonly known as: DEMADEX Take 120 mg by mouth 2 (two) times daily.   traZODone 150 MG tablet Commonly known as: DESYREL Take 150 mg by mouth at bedtime.   Vitamin D 50 MCG (2000 UT) Caps Take 2,000 Units by mouth daily.        Follow-up Information     Knox Royalty, MD. Schedule an appointment as soon as possible for a visit in 1 week(s).   Specialty: Family Medicine Contact information: [address] [place] [address] [phone]                Allergies  Allergen Reactions   Actos [Pioglitazone] Anaphylaxis and Rash   Dexmedetomidine Nausea And Vomiting    (Precedex) Dose-limiting bradycardia     Ibuprofen Shortness Of Breath and Swelling    Pt tolerates aspirin    Tomato Anaphylaxis    Only allergic to RAW tomatoes   Wellbutrin [Bupropion] Swelling     Other Procedures/Studies: DG Chest 2 View  Result Date: 04/22/2023 CLINICAL DATA:  Dyspnea on exertion. EXAM: CHEST - 2 VIEW  COMPARISON:  Chest x-ray dated Apr 18, 2023. FINDINGS: Unchanged tunneled left internal jugular dialysis catheter with tip in the proximal right atrium. Stable cardiomediastinal silhouette. Right brachiocephalic vein stent again noted. Normal pulmonary vascularity. No focal consolidation, pleural effusion, or pneumothorax. No acute osseous abnormality. IMPRESSION: 1. No acute cardiopulmonary disease. Electronically Signed   By: Obie Dredge M.D.   On: 04/22/2023 08:59   DG  Chest 2 View  Result Date: 04/18/2023 CLINICAL DATA:  Shortness of breath. EXAM: CHEST - 2 VIEW COMPARISON:  Apr 07, 2023. FINDINGS: Stable cardiomediastinal silhouette. Left internal jugular dialysis catheter is unchanged. No acute pulmonary disease is noted. Status post right shoulder arthroplasty. IMPRESSION: No active cardiopulmonary disease. Electronically Signed   By: Lupita Raider M.D.   On: 04/18/2023 08:09   DG CHEST PORT 1 VIEW  Result Date: 04/07/2023 CLINICAL DATA:  Check dialysis catheter placement EXAM: PORTABLE CHEST 1 VIEW COMPARISON:  03/03/2023 FINDINGS: Dialysis catheter is noted in satisfactory position on the left. Cardiac shadow is mildly prominent. Lungs are clear. No pneumothorax is noted. No bony abnormality is noted. Vascular stent is noted on the right stable from the prior exam. IMPRESSION: No acute abnormality noted. Electronically Signed   By: Alcide Clever M.D.   On: 04/07/2023 21:07   DG C-Arm 1-60 Min-No Report  Result Date: 04/07/2023 Fluoroscopy was utilized by the requesting physician.  No radiographic interpretation.     TODAY-DAY OF DISCHARGE:  Subjective:   Jeffrey [name] today has no headache,no chest abdominal pain,no new weakness tingling or numbness, feels much better wants to go home today.   Objective:   Blood pressure (!) 145/69, pulse 85, temperature 98 F (36.7 C), temperature source Oral, resp. rate 14, height 5\' 5"  (1.651 m), weight (!) 136.1 kg, SpO2 99 %.  Intake/Output  Summary (Last 24 hours) at 04/24/2023 1110 Last data filed at 04/24/2023 0606 Gross per 24 hour  Intake --  Output 4800 ml  Net -4800 ml   Filed Weights   04/22/23 1442 04/23/23 1330  Weight: 136.1 kg (!) 136.1 kg    Exam: Awake Alert, Oriented *3, No new F.N deficits, Normal affect [place].AT,PERRAL Supple Neck,No JVD, No cervical lymphadenopathy appriciated.  Symmetrical Chest wall movement, Good air movement bilaterally, CTAB RRR,No Gallops,Rubs or new Murmurs, No Parasternal Heave +ve B.Sounds, Abd Soft, Non tender, No organomegaly appriciated, No rebound -guarding or rigidity. No Cyanosis, Clubbing or edema, No new Rash or bruise   PERTINENT RADIOLOGIC STUDIES: No results found.   PERTINENT LAB RESULTS: CBC: Recent Labs    04/22/23 0811 04/23/23 0841  WBC 7.1 7.5  HGB 7.5* 8.0*  HCT 24.0* 25.9*  PLT 108* 117*   CMET CMP     Component Value Date/Time   NA 135 04/24/2023 0334   K 3.6 04/24/2023 0334   CL 96 (L) 04/24/2023 0334   CO2 28 04/24/2023 0334   GLUCOSE 213 (H) 04/24/2023 0334   BUN 62 (H) 04/24/2023 0334   CREATININE 4.45 (H) 04/24/2023 0334   CALCIUM 8.0 (L) 04/24/2023 0334   PROT 7.2 01/19/2023 0832   ALBUMIN 3.3 (L) 04/22/2023 0811   AST 15 01/19/2023 0832   ALT 16 01/19/2023 0832   ALKPHOS 84 01/19/2023 0832   BILITOT 0.5 01/19/2023 0832   GFRNONAA 14 (L) 04/24/2023 0334    GFR Estimated Creatinine Clearance: 22.2 mL/min (A) (by C-G formula based on SCr of 4.45 mg/dL (H)). No results for input(s): "LIPASE", "AMYLASE" in the last 72 hours. No results for input(s): "CKTOTAL", "CKMB", "CKMBINDEX", "TROPONINI" in the last 72 hours. Invalid input(s): "POCBNP" No results for input(s): "DDIMER" in the last 72 hours. No results for input(s): "HGBA1C" in the last 72 hours. No results for input(s): "CHOL", "HDL", "LDLCALC", "TRIG", "CHOLHDL", "LDLDIRECT" in the last 72 hours. No results for input(s): "TSH", "T4TOTAL", "T3FREE", "THYROIDAB" in the last  72 hours.  Invalid input(s): "FREET3" No results  for input(s): "VITAMINB12", "FOLATE", "FERRITIN", "TIBC", "IRON", "RETICCTPCT" in the last 72 hours. Coags: No results for input(s): "INR" in the last 72 hours.  Invalid input(s): "PT" Microbiology: No results found for this or any previous visit (from the past 240 hour(s)).  FURTHER DISCHARGE INSTRUCTIONS:  Get Medicines reviewed and adjusted: Please take all your medications with you for your next visit with your Primary MD  Laboratory/radiological data: Please request your Primary MD to go over all hospital tests and procedure/radiological results at the follow up, please ask your Primary MD to get all Hospital records sent to his/her office.  In some cases, they will be blood work, cultures and biopsy results pending at the time of your discharge. Please request that your primary care M.D. goes through all the records of your hospital data and follows up on these results.  Also Note the following: If you experience worsening of your admission symptoms, develop shortness of breath, life threatening emergency, suicidal or homicidal thoughts you must seek medical attention immediately by calling 911 or calling your MD immediately  if symptoms less severe.  You must read complete instructions/literature along with all the possible adverse reactions/side effects for all the Medicines you take and that have been prescribed to you. Take any new Medicines after you have completely understood and accpet all the possible adverse reactions/side effects.   Do not drive when taking Pain medications or sleeping medications (Benzodaizepines)  Do not take more than prescribed Pain, Sleep and Anxiety Medications. It is not advisable to combine anxiety,sleep and pain medications without talking with your primary care practitioner  Special Instructions: If you have smoked or chewed Tobacco  in the last 2 yrs please stop smoking, stop any regular Alcohol   and or any Recreational drug use.  Wear Seat belts while driving.  Please note: You were cared for by a hospitalist during your hospital stay. Once you are discharged, your primary care physician will handle any further medical issues. Please note that NO REFILLS for any discharge medications will be authorized once you are discharged, as it is imperative that you return to your primary care physician (or establish a relationship with a primary care physician if you do not have one) for your post hospital discharge needs so that they can reassess your need for medications and monitor your lab values.  Total Time spent coordinating discharge including counseling, education and face to face time equals greater than 30 minutes.  Signed: Marcellino Fidalgo 04/24/2023 11:10 AM

## 2023-04-24 NOTE — TOC Transition Note (Signed)
Transition of Care [facility]) - CM/SW Discharge Note   Patient Details  Name: Jeffrey [name] MRN: [id number] Date of Birth: 1961-08-14  [facility] CM/SW Contact:  Tom-Johnson, Hershal Coria, RN Phone Number: 04/24/2023, 5:17 PM   Clinical Narrative:     CM received a call from [employer] and they will transport patient at 7 PM. MD and RN notified. No further TOC needs noted.        Final next level of care: Home/Self Care Barriers to Discharge: Barriers Resolved   Patient Goals and CMS Choice CMS Medicare.gov Compare Post Acute Care list provided to:: Patient Choice offered to / list presented to : Patient  Discharge Placement                  Patient to be transferred to facility by: [facility]      Discharge Plan and Services Additional resources added to the After Visit Summary for                  DME Arranged: N/A DME Agency: NA       HH Arranged: NA HH Agency: NA        Social Determinants of Health (SDOH) Interventions SDOH Screenings   Food Insecurity: No Food Insecurity (04/22/2023)  Housing: Low Risk  (04/22/2023)  Transportation Needs: No Transportation Needs (04/22/2023)  Utilities: Not At Risk (04/22/2023)  Tobacco Use: Medium Risk (04/18/2023)     Readmission Risk Interventions    04/24/2023    4:36 PM 11/03/2022   10:19 AM 04/07/2021    3:59 PM  Readmission Risk Prevention Plan  Transportation Screening Complete Complete Complete  Medication Review (RN Care Manager) Referral to Pharmacy Complete Referral to Pharmacy  PCP or Specialist appointment within 3-5 days of discharge Complete  Complete  HRI or Home Care Consult Complete Complete Complete  SW Recovery Care/Counseling Consult Complete Complete Complete  Palliative Care Screening Not Applicable Not Applicable Not Applicable  Skilled Nursing Facility Not Applicable Not Applicable Complete

## 2023-04-24 NOTE — Progress Notes (Signed)
Refused cpap at this time, no distress noted.

## 2023-04-24 NOTE — Consult Note (Signed)
[facility] Renal Consultation Note    Indication for Consultation:  Management of ESRD/hemodialysis; anemia, hypertension/volume and secondary hyperparathyroidism   HPI: Jeffrey [name] is a 62 y.o. male with ESRD on HD, DM, HTN, dCHF, OSA who is admitted with hyperglycemia and needing dialysis.  He remains without an outpatient dialysis center and comes to the ED for dialysis. Last dialysis was Tuesday 5/21. Presented for dialysis on Saturday and found to have glucose >500. Insulin gtt started. He did receive dialysis Saturday with 2L removed. Dialysis via L TDC. He has new LUE AVG placed 04/07/23 by Dr. Edilia Bo.   Had extra UF only dialysis yesterday with 3.5 L removed.  Remains on 3L [place] this am. Concerned about swelling in extremities. Plan for another treatment today and discharge after.    Past Medical History:  Diagnosis Date   Altered mental status 05/04/2021   Arthritis    Blood transfusion without reported diagnosis    CHF (congestive heart failure) (HCC)    COPD (chronic obstructive pulmonary disease) (HCC)    Depression    Diabetes mellitus without complication (HCC)    type 2   Dyspnea    ESRD on hemodialysis (HCC)    Tues Thurs Sat   GERD (gastroesophageal reflux disease)    protonix   Heart murmur    as a child, no problems   Hypertension    Myocardial infarction (HCC)    PTSD (post-traumatic stress disorder)    Sepsis (HCC)    Sleep apnea    occasional uses CPAP   Uses powered wheelchair    Past Surgical History:  Procedure Laterality Date   A/V FISTULAGRAM N/A 08/30/2021   Procedure: A/V FISTULAGRAM;  Surgeon: Maeola Harman, MD;  Location: [facility] INVASIVE CV LAB;  Service: Cardiovascular;  Laterality: N/A;   A/V FISTULAGRAM Right 07/28/2022   Procedure: A/V Fistulagram;  Surgeon: Maeola Harman, MD;  Location: [facility] INVASIVE CV LAB;  Service: Cardiovascular;  Laterality: Right;   A/V FISTULAGRAM Right 09/05/2022   Procedure: A/V  Fistulagram;  Surgeon: Maeola Harman, MD;  Location: [facility] INVASIVE CV LAB;  Service: Cardiovascular;  Laterality: Right;   A/V FISTULAGRAM Right 10/31/2022   Procedure: A/V Fistulagram;  Surgeon: Maeola Harman, MD;  Location: [facility] INVASIVE CV LAB;  Service: Cardiovascular;  Laterality: Right;   A/V SHUNT INTERVENTION Right 10/31/2022   Procedure: A/V SHUNT INTERVENTION;  Surgeon: Maeola Harman, MD;  Location: [facility] INVASIVE CV LAB;  Service: Cardiovascular;  Laterality: Right;   A/V SHUNTOGRAM N/A 11/08/2021   Procedure: A/V SHUNTOGRAM;  Surgeon: Maeola Harman, MD;  Location: [facility] INVASIVE CV LAB;  Service: Cardiovascular;  Laterality: N/A;   AMPUTATION Left 04/03/2021   Procedure: LEFT ABOVE-THE-KNEE AMPUTATION;  Surgeon: Terance Hart, MD;  Location: [facility] OR;  Service: Orthopedics;  Laterality: Left;   AV FISTULA PLACEMENT Right 08/12/2021   Procedure: INSERTION OF ARTERIOVENOUS (AV) GORE-TEX GRAFT RIGHT ARM;  Surgeon: Nada Libman, MD;  Location: [facility] OR;  Service: Vascular;  Laterality: Right;   AV FISTULA PLACEMENT Left 04/07/2023   Procedure: ARTERIOVENOUS (AV) FISTULA GRAFT USING GORETEX STRETCH (4-7MM);  Surgeon: Chuck Hint, MD;  Location: [facility] OR;  Service: Vascular;  Laterality: Left;   INSERTION OF DIALYSIS CATHETER N/A 11/25/2022   Procedure: INSERTION OF DIALYSIS CATHETER;  Surgeon: Lorin Glass, MD;  Location: [facility] ENDOSCOPY;  Service: Pulmonary;  Laterality: N/A;   INSERTION OF DIALYSIS CATHETER N/A 03/03/2023   Procedure: INSERTION OF TUNNELED DIALYSIS CATHETER;  Surgeon: Maeola Harman, MD;  Location: [facility] OR;  Service: Vascular;  Laterality: N/A;   INSERTION OF DIALYSIS CATHETER Left 04/07/2023   Procedure: INSERTION OF DIALYSIS CATHETER USING PALINDROME  28CM PRECISION CHRONIC CATHETER KIT;  Surgeon: Chuck Hint, MD;  Location: [facility] OR;  Service: Vascular;  Laterality: Left;   IR REMOVAL TUN CV CATH W/O FL   03/30/2021   JOINT REPLACEMENT     Bilateral knees   LIGATION ARTERIOVENOUS GORTEX GRAFT Right 03/03/2023   Procedure: LIGATION RIGHT ARM ARTERIOVENOUS GORTEX GRAFT;  Surgeon: Maeola Harman, MD;  Location: [facility] OR;  Service: Vascular;  Laterality: Right;   PERIPHERAL VASCULAR BALLOON ANGIOPLASTY Right 08/30/2021   Procedure: PERIPHERAL VASCULAR BALLOON ANGIOPLASTY;  Surgeon: Maeola Harman, MD;  Location: [facility] INVASIVE CV LAB;  Service: Cardiovascular;  Laterality: Right;   PERIPHERAL VASCULAR BALLOON ANGIOPLASTY Right 07/28/2022   Procedure: PERIPHERAL VASCULAR BALLOON ANGIOPLASTY;  Surgeon: Maeola Harman, MD;  Location: [facility] INVASIVE CV LAB;  Service: Cardiovascular;  Laterality: Right;  AVF   PERIPHERAL VASCULAR INTERVENTION  11/08/2021   Procedure: PERIPHERAL VASCULAR INTERVENTION;  Surgeon: Maeola Harman, MD;  Location: [facility] INVASIVE CV LAB;  Service: Cardiovascular;;  Central rt arm fistula   UPPER EXTREMITY VENOGRAPHY Right 08/10/2021   Procedure: UPPER EXTREMITY VENOGRAPHY;  Surgeon: Nada Libman, MD;  Location: [facility] INVASIVE CV LAB;  Service: Cardiovascular;  Laterality: Right;   No family history on file. Social History:  reports that he quit smoking about 12 years ago. His smoking use included cigarettes. He has never used smokeless tobacco. He reports current alcohol use. He reports that he does not currently use drugs. Allergies  Allergen Reactions   Actos [Pioglitazone] Anaphylaxis and Rash   Dexmedetomidine Nausea And Vomiting    (Precedex) Dose-limiting bradycardia     Ibuprofen Shortness Of Breath and Swelling    Pt tolerates aspirin    Tomato Anaphylaxis    Only allergic to RAW tomatoes   Wellbutrin [Bupropion] Swelling   Prior to Admission medications   Medication Sig Start Date End Date Taking? Authorizing Provider  amLODipine (NORVASC) 10 MG tablet Take 10 mg by mouth every morning. 04/17/20  Yes [provider]   ASPIRIN LOW DOSE 81 MG EC tablet Take 81 mg by mouth in the morning. 02/22/21  Yes [provider]  atorvastatin (LIPITOR) 40 MG tablet Take 40 mg by mouth in the morning. 02/09/21  Yes [provider]  calcium acetate (PHOSLO) 667 MG capsule Take 2 capsules (1,334 mg total) by mouth with breakfast, with lunch, and with evening meal. 04/10/21  Yes Rizwan, Ladell Heads, MD  Calcium Carbonate Antacid (CALCIUM CARBONATE, DOSED IN MG ELEMENTAL CALCIUM,) 1250 MG/5ML SUSP Take 5 mLs (500 mg of elemental calcium total) by mouth every 6 (six) hours as needed for indigestion. 04/08/23  Yes Rhetta Mura, MD  carvedilol (COREG) 12.5 MG tablet Take 12.5 mg by mouth 2 (two) times daily with a meal. 04/17/20  Yes [provider]  Cholecalciferol (VITAMIN D) 50 MCG (2000 UT) CAPS Take 2,000 Units by mouth daily.   Yes [provider]  clopidogrel (PLAVIX) 75 MG tablet Take 1 tablet (75 mg total) by mouth daily. 11/02/22  Yes Rhyne, Ames Coupe, PA-C  cyclobenzaprine (FLEXERIL) 10 MG tablet Take 10 mg by mouth 3 (three) times daily as needed for muscle spasms. 12/29/21  Yes [provider]  diazepam (VALIUM) 5 MG tablet Take 5 mg by mouth 2 (two) times daily as needed for  muscle spasms. 12/09/21  Yes [provider]  insulin regular human CONCENTRATED (HUMULIN R U-500 KWIKPEN) 500 UNIT/ML KwikPen Inject 45 Units into the skin daily with supper. Patient taking differently: Inject 70-125 Units into the skin 2 (two) times daily with a meal. 09/04/21 08/30/24 Yes Ogbata, Lafayette Dragon, MD  metolazone (ZAROXOLYN) 10 MG tablet Take 10 mg by mouth daily. 07/13/21  Yes [provider]  nortriptyline (PAMELOR) 10 MG capsule Take 20 mg by mouth at bedtime. 10/23/19 08/30/24 Yes [provider]  oxyCODONE (ROXICODONE) 5 MG immediate release tablet Take 1 tablet (5 mg total) by mouth every 8 (eight) hours as needed. Patient taking differently: Take 5 mg by mouth every 8  (eight) hours as needed for moderate pain. 12/31/22 12/31/23 Yes [name], Eric J, DO  pantoprazole (PROTONIX) 40 MG tablet Take 40 mg by mouth daily before breakfast. 02/09/21  Yes [provider]  pregabalin (LYRICA) 75 MG capsule Take 1 capsule (75 mg total) by mouth See admin instructions. Daily.  Give after dialysis on dialysis days. 01/21/23  Yes [name], MD  sertraline (ZOLOFT) 100 MG tablet Take 100 mg by mouth in the morning. 02/22/21  Yes [provider]  tamsulosin (FLOMAX) 0.4 MG CAPS capsule Take 1 capsule (0.4 mg total) by mouth daily. 11/10/21  Yes Pahwani, Daleen Bo, MD  torsemide (DEMADEX) 20 MG tablet Take 120 mg by mouth 2 (two) times daily.   Yes [provider]  traZODone (DESYREL) 150 MG tablet Take 150 mg by mouth at bedtime. 12/09/21  Yes [provider]   Current Facility-Administered Medications  Medication Dose Route Frequency Provider Last Rate Last Admin   acetaminophen (TYLENOL) tablet 650 mg  650 mg Oral Q6H PRN Carollee Herter, DO       alteplase (CATHFLO ACTIVASE) injection 2 mg  2 mg Intracatheter Once PRN Tomasa Blase, PA-C       amLODipine (NORVASC) tablet 10 mg  10 mg Oral q morning Synetta Fail, MD   10 mg at 04/24/23 9147   anticoagulant sodium citrate solution 5 mL  5 mL Intracatheter PRN Tomasa Blase, PA-C       atorvastatin (LIPITOR) tablet 40 mg  40 mg Oral q AM Synetta Fail, MD   40 mg at 04/24/23 0600   calcium acetate (PHOSLO) capsule 1,334 mg  1,334 mg Oral TID with meals Synetta Fail, MD   1,334 mg at 04/24/23 0909   carvedilol (COREG) tablet 12.5 mg  12.5 mg Oral BID WC Synetta Fail, MD   12.5 mg at 04/24/23 8295   Chlorhexidine Gluconate Cloth 2 % PADS 6 each  6 each Topical Q0600 Synetta Fail, MD   6 each at 04/24/23 6213   Darbepoetin Alfa (ARANESP) injection 60 mcg  60 mcg Subcutaneous Once Tomasa Blase, PA-C       dextrose 50 % solution 0-50 mL  0-50 mL  Intravenous PRN Synetta Fail, MD       diazepam (VALIUM) tablet 5 mg  5 mg Oral BID PRN Synetta Fail, MD   5 mg at 04/22/23 2015   heparin injection 1,000 Units  1,000 Units Intracatheter PRN Tomasa Blase, PA-C       heparin injection 2,000 Units  2,000 Units Dialysis PRN Synetta Fail, MD   2,200 Units at 04/22/23 1347   heparin injection 5,000 Units  5,000 Units Subcutaneous Q8H Synetta Fail, MD   5,000 Units at 04/24/23  0557   heparin injection 5,000 Units  5,000 Units Dialysis PRN Tomasa Blase, PA-C   5,000 Units at 04/23/23 1340   insulin aspart (novoLOG) injection 0-15 Units  0-15 Units Subcutaneous TID WC Maretta Bees, MD   5 Units at 04/24/23 0920   insulin aspart (novoLOG) injection 0-5 Units  0-5 Units Subcutaneous QHS Carollee Herter, DO       insulin regular human CONCENTRATED (HUMULIN R) 500 UNIT/ML KwikPen 70 Units  70 Units Subcutaneous BID WC Maretta Bees, MD   70 Units at 04/24/23 0930   lidocaine (PF) (XYLOCAINE) 1 % injection 5 mL  5 mL Intradermal PRN Synetta Fail, MD       lidocaine-prilocaine (EMLA) cream 1 Application  1 Application Topical PRN Synetta Fail, MD       metolazone (ZAROXOLYN) tablet 10 mg  10 mg Oral Daily Synetta Fail, MD   10 mg at 04/24/23 9604   nortriptyline (PAMELOR) capsule 20 mg  20 mg Oral QHS Synetta Fail, MD   20 mg at 04/23/23 2112   pantoprazole (PROTONIX) EC tablet 40 mg  40 mg Oral QAC breakfast Synetta Fail, MD   40 mg at 04/24/23 5409   pentafluoroprop-tetrafluoroeth (GEBAUERS) aerosol 1 Application  1 Application Topical PRN Synetta Fail, MD       [START ON 04/25/2023] pregabalin (LYRICA) capsule 75 mg  75 mg Oral Q T,Th,Sat-1800 Synetta Fail, MD       sertraline (ZOLOFT) tablet 100 mg  100 mg Oral q AM Synetta Fail, MD   100 mg at 04/24/23 0600   tamsulosin (FLOMAX) capsule 0.4 mg  0.4 mg Oral Daily Synetta Fail, MD   0.4 mg at  04/24/23 8119   torsemide (DEMADEX) tablet 120 mg  120 mg Oral BID Synetta Fail, MD   120 mg at 04/24/23 1478   traZODone (DESYREL) tablet 150 mg  150 mg Oral QHS Synetta Fail, MD   150 mg at 04/23/23 2112     ROS: As per HPI otherwise negative.  Physical Exam: Vitals:   04/23/23 2000 04/24/23 0000 04/24/23 0400 04/24/23 0800  BP: (!) 148/70 (!) 116/57 (!) 145/69 (!) 147/74  Pulse: 92 92 85 88  Resp: 19 20 14 15   Temp: 97.8 F (36.6 C) 98 F (36.7 C) 97.9 F (36.6 C) 98 F (36.7 C)  TempSrc: Oral Oral Oral Oral  SpO2: 99% 100% 99% 92%  Weight:      Height:         General: Appears comfortable, in no distress, on nasal oxygen  Head: NCAT sclera not icteric MMM Lungs: Clear bilaterally. Normal WOB  Heart: RRR, no murmur, rub, or gallop  Abdomen: soft non-tender, bowel sounds normal, no masses  Lower extremities: L AKA, 1+ edema  Neuro: A & O X 3. Moves all extremities spontaneously. Psych:  Responds to questions appropriately with a normal affect. Dialysis Access: LUE AVG + bruit ; L TDC in place   Labs: Basic Metabolic Panel: Recent Labs  Lab 04/18/23 0756 04/22/23 0811 04/23/23 0359 04/24/23 0334  NA 134* 131* 136 135  K 3.6 4.1 3.6 3.6  CL 92* 94* 98 96*  CO2 25 26 27 28   GLUCOSE 246* 527* 153* 213*  BUN 71* 87* 50* 62*  CREATININE 5.27* 4.72* 3.49* 4.45*  CALCIUM 8.2* 7.9* 7.9* 8.0*  PHOS 5.3* 4.3  --   --    Liver Function Tests:  Recent Labs  Lab 04/18/23 0756 04/22/23 0811  ALBUMIN 3.3* 3.3*   No results for input(s): "LIPASE", "AMYLASE" in the last 168 hours. No results for input(s): "AMMONIA" in the last 168 hours. CBC: Recent Labs  Lab 04/18/23 0756 04/22/23 0811 04/23/23 0841  WBC 5.8 7.1 7.5  HGB 7.5* 7.5* 8.0*  HCT 24.1* 24.0* 25.9*  MCV 89.3 89.6 90.9  PLT 123* 108* 117*   Cardiac Enzymes: No results for input(s): "CKTOTAL", "CKMB", "CKMBINDEX", "TROPONINI" in the last 168 hours. CBG: Recent Labs  Lab 04/23/23 0915  04/23/23 1108 04/23/23 1838 04/23/23 2054 04/24/23 0908  GLUCAP 225* 359* 245* 184* 216*   Iron Studies: No results for input(s): "IRON", "TIBC", "TRANSFERRIN", "FERRITIN" in the last 72 hours. Studies/Results: No results found.  Currently without outpatient dialysis center  Assessment/Plan:  Hyperglycemia: Presented with glucose >500. Insulin per admitting team. History of brittle diabetes     ESRD:  On TTS schedule. Comes to ED for dialysis. Missed Thursday. Had dialysis Sat and extra UF treatment on Sunday. Plan for another dialysis today to optimize volume status    Hypertension/volume: BP elevated and has edema on exam. UFG 2-3L today as tolerated.   Anemia: Hgb 8.0- Will order Aranesp 60 today   Metabolic bone disease: Ca/Phos acceptable. Continue calcium acetate.   Nutrition:  Renal/carb diet with fluid restriction  Tomasa Blase PA-C [facility] 04/24/2023, 11:59 AM

## 2023-04-25 NOTE — Progress Notes (Unsigned)
    Postoperative Access Visit   History of Present Illness   DYNELL DISKIN is a 62 y.o. year old male who presents for postoperative follow-up for: 1.  Placement of left IJ 28 cm tunneled dialysis catheter 2. Placement of new left upper arm AV graft (4-7 mm PTFE graft) on 04/07/23 by Dr. Edilia Bo. The patient's wounds are well healed.  The patient notes mild steal symptoms with occasional pains in left hand especially if he uses the left hand a lot. He also has some swelling in the arm. This is improved a little with elevation.   He currently dialyzes via the left IJ TDC on TTS at [facility]  Physical Examination   Vitals:   04/26/23 1427  BP: (!) 144/70  Pulse: 92  Temp: 98.3 F (36.8 C)  TempSrc: Temporal  SpO2: 94%   There is no height or weight on file to calculate BMI.  left arm Incisions are well healed, 2+ radial pulse, hand grip is 5/5, sensation in digits is intact, palpable thrill, bruit can be auscultated     Medical Decision Making   KIREN HAVRON is a 62 y.o. year old male who presents s/p 1. Placement of left IJ 28 cm tunneled dialysis catheter 2. Placement of new left upper arm AV graft (4-7 mm PTFE graft) on 04/07/23 by Dr. Edilia Bo. The incisions have healed very nicely. He has an excellent thrill in the graft.  Patent has only minimal signs or symptoms of steal syndrome. No intervention indicated at this time The patient's access will be ready for use 05/08/23 The patient's tunneled dialysis catheter can be removed when Nephrology is comfortable with the performance of the left AV graft The patient may follow up on a prn basis   Graceann Congress, PA-C [facility] Office: [phone]  Clinic MD: Dickson/Cain

## 2023-04-26 ENCOUNTER — Ambulatory Visit (INDEPENDENT_AMBULATORY_CARE_PROVIDER_SITE_OTHER): Payer: 59 | Admitting: Physician Assistant

## 2023-04-26 VITALS — BP 144/70 | HR 92 | Temp 98.3°F

## 2023-04-26 DIAGNOSIS — N186 End stage renal disease: Secondary | ICD-10-CM

## 2023-04-26 DIAGNOSIS — Z992 Dependence on renal dialysis: Secondary | ICD-10-CM

## 2023-04-27 ENCOUNTER — Encounter (HOSPITAL_COMMUNITY): Payer: Self-pay

## 2023-04-27 ENCOUNTER — Other Ambulatory Visit: Payer: Self-pay

## 2023-04-27 ENCOUNTER — Emergency Department (HOSPITAL_COMMUNITY)
Admission: EM | Admit: 2023-04-27 | Discharge: 2023-04-27 | Disposition: A | Discharge status: Home / Self Care | Payer: 59 | Attending: Emergency Medicine | Admitting: Emergency Medicine

## 2023-04-27 DIAGNOSIS — E1122 Type 2 diabetes mellitus with diabetic chronic kidney disease: Secondary | ICD-10-CM | POA: Diagnosis not present

## 2023-04-27 DIAGNOSIS — J449 Chronic obstructive pulmonary disease, unspecified: Secondary | ICD-10-CM | POA: Insufficient documentation

## 2023-04-27 DIAGNOSIS — I132 Hypertensive heart and chronic kidney disease with heart failure and with stage 5 chronic kidney disease, or end stage renal disease: Secondary | ICD-10-CM | POA: Diagnosis present

## 2023-04-27 DIAGNOSIS — Z794 Long term (current) use of insulin: Secondary | ICD-10-CM | POA: Diagnosis not present

## 2023-04-27 DIAGNOSIS — N186 End stage renal disease: Secondary | ICD-10-CM | POA: Insufficient documentation

## 2023-04-27 DIAGNOSIS — Z79899 Other long term (current) drug therapy: Secondary | ICD-10-CM | POA: Diagnosis not present

## 2023-04-27 DIAGNOSIS — E1165 Type 2 diabetes mellitus with hyperglycemia: Secondary | ICD-10-CM | POA: Diagnosis not present

## 2023-04-27 DIAGNOSIS — Z7902 Long term (current) use of antithrombotics/antiplatelets: Secondary | ICD-10-CM | POA: Diagnosis not present

## 2023-04-27 DIAGNOSIS — I509 Heart failure, unspecified: Secondary | ICD-10-CM | POA: Diagnosis not present

## 2023-04-27 DIAGNOSIS — R739 Hyperglycemia, unspecified: Secondary | ICD-10-CM

## 2023-04-27 DIAGNOSIS — Z7982 Long term (current) use of aspirin: Secondary | ICD-10-CM | POA: Insufficient documentation

## 2023-04-27 DIAGNOSIS — Z992 Dependence on renal dialysis: Secondary | ICD-10-CM | POA: Insufficient documentation

## 2023-04-27 LAB — CBC WITH DIFFERENTIAL/PLATELET
Abs Immature Granulocytes: 0.03 10*3/uL (ref 0.00–0.07)
Basophils Absolute: 0 10*3/uL (ref 0.0–0.1)
Basophils Relative: 0 %
Eosinophils Absolute: 0.2 10*3/uL (ref 0.0–0.5)
Eosinophils Relative: 3 %
HCT: 23.7 % — ABNORMAL LOW (ref 39.0–52.0)
Hemoglobin: 7.3 g/dL — ABNORMAL LOW (ref 13.0–17.0)
Immature Granulocytes: 1 %
Lymphocytes Relative: 9 %
Lymphs Abs: 0.5 10*3/uL — ABNORMAL LOW (ref 0.7–4.0)
MCH: 27.3 pg (ref 26.0–34.0)
MCHC: 30.8 g/dL (ref 30.0–36.0)
MCV: 88.8 fL (ref 80.0–100.0)
Monocytes Absolute: 0.4 10*3/uL (ref 0.1–1.0)
Monocytes Relative: 7 %
Neutro Abs: 3.9 10*3/uL (ref 1.7–7.7)
Neutrophils Relative %: 80 %
Platelets: 112 10*3/uL — ABNORMAL LOW (ref 150–400)
RBC: 2.67 MIL/uL — ABNORMAL LOW (ref 4.22–5.81)
RDW: 14.1 % (ref 11.5–15.5)
WBC: 4.9 10*3/uL (ref 4.0–10.5)
nRBC: 0 % (ref 0.0–0.2)

## 2023-04-27 LAB — BASIC METABOLIC PANEL
Anion gap: 15 (ref 5–15)
BUN: 76 mg/dL — ABNORMAL HIGH (ref 8–23)
CO2: 24 mmol/L (ref 22–32)
Calcium: 8 mg/dL — ABNORMAL LOW (ref 8.9–10.3)
Chloride: 89 mmol/L — ABNORMAL LOW (ref 98–111)
Creatinine, Ser: 5.17 mg/dL — ABNORMAL HIGH (ref 0.61–1.24)
GFR, Estimated: 12 mL/min — ABNORMAL LOW (ref >60)
Glucose, Bld: 555 mg/dL (ref 70–99)
Potassium: 4 mmol/L (ref 3.5–5.1)
Sodium: 128 mmol/L — ABNORMAL LOW (ref 135–145)

## 2023-04-27 LAB — GLUCOSE, RANDOM: Glucose, Bld: 480 mg/dL — ABNORMAL HIGH (ref 70–99)

## 2023-04-27 MED ORDER — CHLORHEXIDINE GLUCONATE CLOTH 2 % EX PADS: 6.0000 | MEDICATED_PAD | Freq: Every day | CUTANEOUS | Status: DC

## 2023-04-27 MED ORDER — SODIUM CHLORIDE 0.9 % IV SOLN
250.0000 mg | Freq: Once | INTRAVENOUS | Status: AC
  Administered 2023-04-27: 250 mg via INTRAVENOUS
  Filled 2023-04-27: qty 20

## 2023-04-27 MED ORDER — HEPARIN SODIUM (PORCINE) 1000 UNIT/ML DIALYSIS
2000.0000 [IU] | Freq: Once | INTRAMUSCULAR | Status: AC
  Administered 2023-04-27: 2000 [IU] via INTRAVENOUS_CENTRAL
  Filled 2023-04-27: qty 2

## 2023-04-27 MED ORDER — INSULIN ASPART 100 UNIT/ML IJ SOLN: 10.0000 [IU] | Freq: Once | INTRAMUSCULAR | Status: DC

## 2023-04-27 NOTE — Progress Notes (Signed)
   04/27/23 1406  Vitals  Temp 98 F (36.7 C)  Pulse Rate 84  Resp 10  BP (!) 119/30  SpO2 100 %  Post Treatment  Dialyzer Clearance Other (Comment) (unable to rinseback)  Duration of HD Treatment -hour(s) 2.35 hour(s)  Hemodialysis Intake (mL) 0 mL  Liters Processed 60.9  Fluid Removed (mL) 2500 mL  Tolerated HD Treatment Yes   Received patient in bed to unit.  Alert and oriented.  Informed consent signed and in chart.   TX duration:2.35hrs  Patient tolerated well.  Transported back to the room  Alert, without acute distress.  Hand-off given to patient's nurse.   Access used: [facility] Access issues: none  Total UF removed: 2.5L Medication(s) given: iron    Na'Shaminy T [facility]

## 2023-04-27 NOTE — ED Provider Notes (Signed)
[place] EMERGENCY DEPARTMENT AT [facility] Provider Note   CSN: [id number] Arrival date & time: 04/27/23  4098     History  No chief complaint on file.   Jeffrey [name] is a 62 y.o. male.  Jeffrey [name] is a 62 y.o. male history of ESRD on HD, hypertension, diabetes, CHF, COPD, who presents to the ED for routine dialysis.  Due to threatening behavior at prior dialysis center patient has been receiving his regular dialysis T,Th,S through the ED.  Patient was recently admitted to the hospital for fluid overload and was discharged on 5/27 and was dialyzed last on that day prior to discharge.  He reports since then he has been doing well, is on his baseline 3 L nasal cannula denies shortness of breath or chest pain.  No other complaints.  The history is provided by the patient and medical records.       Home Medications Prior to Admission medications   Medication Sig Start Date End Date Taking? Authorizing Provider  amLODipine (NORVASC) 10 MG tablet Take 10 mg by mouth every morning. 04/17/20   [provider]  ASPIRIN LOW DOSE 81 MG EC tablet Take 81 mg by mouth in the morning. 02/22/21   [provider]  atorvastatin (LIPITOR) 40 MG tablet Take 40 mg by mouth in the morning. 02/09/21   [provider]  calcium acetate (PHOSLO) 667 MG capsule Take 2 capsules (1,334 mg total) by mouth with breakfast, with lunch, and with evening meal. 04/10/21   Calvert Cantor, MD  Calcium Carbonate Antacid (CALCIUM CARBONATE, DOSED IN MG ELEMENTAL CALCIUM,) 1250 MG/5ML SUSP Take 5 mLs (500 mg of elemental calcium total) by mouth every 6 (six) hours as needed for indigestion. 04/08/23   Rhetta Mura, MD  carvedilol (COREG) 12.5 MG tablet Take 12.5 mg by mouth 2 (two) times daily with a meal. 04/17/20   [provider]  Cholecalciferol (VITAMIN D) 50 MCG (2000 UT) CAPS Take 2,000 Units by mouth daily.    [provider]  clopidogrel (PLAVIX) 75 MG  tablet Take 1 tablet (75 mg total) by mouth daily. 11/02/22   Rhyne, Ames Coupe, PA-C  cyclobenzaprine (FLEXERIL) 10 MG tablet Take 10 mg by mouth 3 (three) times daily as needed for muscle spasms. 12/29/21   [provider]  diazepam (VALIUM) 5 MG tablet Take 5 mg by mouth 2 (two) times daily as needed for muscle spasms. 12/09/21   [provider]  insulin regular human CONCENTRATED (HUMULIN R U-500 KWIKPEN) 500 UNIT/ML KwikPen Inject 45 Units into the skin daily with supper. Patient taking differently: Inject 70-125 Units into the skin 2 (two) times daily with a meal. 09/04/21 08/30/24  [name], MD  metolazone (ZAROXOLYN) 10 MG tablet Take 10 mg by mouth daily. 07/13/21   [provider]  nortriptyline (PAMELOR) 10 MG capsule Take 20 mg by mouth at bedtime. 10/23/19 08/30/24  [provider]  oxyCODONE (ROXICODONE) 5 MG immediate release tablet Take 1 tablet (5 mg total) by mouth every 8 (eight) hours as needed. Patient taking differently: Take 5 mg by mouth every 8 (eight) hours as needed for moderate pain. 12/31/22 12/31/23  [name], [name], DO  pantoprazole (PROTONIX) 40 MG tablet Take 40 mg by mouth daily before breakfast. 02/09/21   [provider]  pregabalin (LYRICA) 75 MG capsule Take 1 capsule (75 mg total) by mouth See admin instructions. Daily.  Give after dialysis on dialysis days. 01/21/23  [name], MD  sertraline (ZOLOFT) 100 MG tablet Take 100 mg by mouth in the morning. 02/22/21   [provider]  tamsulosin (FLOMAX) 0.4 MG CAPS capsule Take 1 capsule (0.4 mg total) by mouth daily. 11/10/21   [name], MD  torsemide (DEMADEX) 20 MG tablet Take 120 mg by mouth 2 (two) times daily.    [provider]  traZODone (DESYREL) 150 MG tablet Take 150 mg by mouth at bedtime. 12/09/21   [provider]      Allergies    Actos [pioglitazone], Dexmedetomidine, Ibuprofen, Tomato, and Wellbutrin [bupropion]    Review  of Systems   Review of Systems  Constitutional:  Negative for chills and fever.  Respiratory:  Negative for shortness of breath.   Cardiovascular:  Negative for chest pain.    Physical Exam Updated Vital Signs BP (!) 147/49   Pulse 88   Temp 98.1 F (36.7 C) (Oral)   Resp 20   SpO2 98%  Physical Exam Vitals and nursing note reviewed.  Constitutional:      General: He is not in acute distress.    Appearance: Normal appearance. He is well-developed. He is not ill-appearing or diaphoretic.  HENT:     Head: Normocephalic and atraumatic.  Eyes:     General:        Right eye: No discharge.        Left eye: No discharge.  Cardiovascular:     Rate and Rhythm: Normal rate and regular rhythm.  Pulmonary:     Effort: Pulmonary effort is normal. No respiratory distress.     Comments: On 3 L nasal cannula respirations are equal and unlabored, sats 98-100%, on auscultation lungs clear without crackles, rhonchi or wheezing. Neurological:     Mental Status: He is alert and oriented to person, place, and time.     Coordination: Coordination normal.  Psychiatric:        Mood and Affect: Mood normal.        Behavior: Behavior normal.     ED Results / Procedures / Treatments   Labs (all labs ordered are listed, but only abnormal results are displayed) Labs Reviewed  BASIC METABOLIC PANEL - Abnormal; Notable for the following components:      Result Value   Sodium 128 (*)    Chloride 89 (*)    Glucose, Bld 555 (*)    BUN 76 (*)    Creatinine, Ser 5.17 (*)    Calcium 8.0 (*)    GFR, Estimated 12 (*)    All other components within normal limits  CBC WITH DIFFERENTIAL/PLATELET - Abnormal; Notable for the following components:   RBC 2.67 (*)    Hemoglobin 7.3 (*)    HCT 23.7 (*)    Platelets 112 (*)    Lymphs Abs 0.5 (*)    All other components within normal limits  GLUCOSE, RANDOM - Abnormal; Notable for the following components:   Glucose, Bld 480 (*)    All other components  within normal limits  CBG MONITORING, ED    EKG None  Radiology No results found.  Procedures Procedures    Medications Ordered in ED Medications - No data to display  ED Course/ Medical Decision Making/ A&P                             Medical Decision Making Amount and/or Complexity of Data Reviewed Labs: ordered.  Risk Prescription drug  management.   62 year old male presents to the ED for routine dialysis.  He denies any chest pain, shortness of breath or other complaints.  Last dialyzed 3 days ago on 5/27 just prior to discharge from the hospital, currently satting well on his chronic 3 L nasal cannula.  Will check routine labs and consult nephrology for dialysis.  Patient prefers to hold off on EKG or chest x-ray since he is not currently experiencing any symptoms.  CBC and BMP obtained, CKD related anemia with stable hemoglobin, no leukocytosis, glucose of 555, no anion gap and normal CO2, will recheck blood sugar and treat with insulin if needed, creatinine of 5.17 with BUN of 76.  Normal potassium.  Patient taken to dialysis.  Received message from dialysis RN regarding hyperglycemia, 10 units of subcu insulin ordered.        Final Clinical Impression(s) / ED Diagnoses Final diagnoses:  ESRD needing dialysis [facility])  Hyperglycemia    Rx / DC Orders ED Discharge Orders     None         Dartha Lodge, [place] 04/27/23 1649    Elayne Snare K, DO 05/02/23 0700

## 2023-04-27 NOTE — Progress Notes (Signed)
Date and time results received: 04/27/23 10:23 (use smartphrase ".now" to insert current time)  Test: glucose Critical Value: 555  Name of Provider Notified: Dr Delano Metz, and Cinda Quest  Orders Received? Or Actions Taken?: redraw labs per Jodi Geralds, [place]

## 2023-04-27 NOTE — Procedures (Signed)
We are asked to see this patient for hospital dialysis. Pt was discharged from his OP HD unit in December 2023 due to behavioral issues. Pt came to ED today requesting hospital dialysis. Plan is for HD upstairs then will return to ED for reassessment and expected discharge home.      Last OP HD orders (dec 2023):  3:15h  129kg  RUE AVG   Heparin 2000    - last hep B labs here were from 04/07/23   When ordering darbepoeitin for this patient, do not use the prepared order of "SQ at 1800" as this patient has to leave the hospital premises by 5 pm to catch his ride home.    Tsat was low on 5/14 and Hb was 8.1 --> pharm suggested ferric gluconate 250mg  q HD x 4.  2nd dose of 4 will be given 250mg  today w/ HD.       Date             Hb         Tsat/ ferritin     IV Fe    Darbe SQ     Nulecit 12/02/22            11.0       22%/ 935 01/24/23           9.0        31%/ 1056  02/16/23            8.8                                                         03/14/23            8.1                                                                        03/16/23            8.5                                              60 mcg 03/30/23            8.0                                              60 mcg  04/08/23            8.0  04/11/23            8.1        11%/ 545                       100 mcg 5/18  250mg  5/25                 7.5 5/27                 8.0                                               60 mcg         I was present at this dialysis session, have reviewed the session and made  appropriate changes Vinson Moselle MD  [facility] 04/27/2023, 10:59 AM '

## 2023-04-27 NOTE — ED Triage Notes (Signed)
Pt came in via POV requesting dialysis. Denies any pain/discomfort upon arrival, A/Ox4.

## 2023-04-29 ENCOUNTER — Emergency Department (HOSPITAL_COMMUNITY)
Admission: EM | Admit: 2023-04-29 | Discharge: 2023-04-29 | Disposition: A | Discharge status: Home / Self Care | Payer: 59 | Attending: Emergency Medicine | Admitting: Emergency Medicine

## 2023-04-29 ENCOUNTER — Other Ambulatory Visit: Payer: Self-pay

## 2023-04-29 ENCOUNTER — Encounter (HOSPITAL_COMMUNITY): Payer: Self-pay

## 2023-04-29 DIAGNOSIS — D631 Anemia in chronic kidney disease: Secondary | ICD-10-CM | POA: Diagnosis not present

## 2023-04-29 DIAGNOSIS — N186 End stage renal disease: Secondary | ICD-10-CM | POA: Diagnosis not present

## 2023-04-29 DIAGNOSIS — Z7982 Long term (current) use of aspirin: Secondary | ICD-10-CM | POA: Insufficient documentation

## 2023-04-29 DIAGNOSIS — R739 Hyperglycemia, unspecified: Secondary | ICD-10-CM | POA: Insufficient documentation

## 2023-04-29 DIAGNOSIS — Z7902 Long term (current) use of antithrombotics/antiplatelets: Secondary | ICD-10-CM | POA: Insufficient documentation

## 2023-04-29 DIAGNOSIS — Z794 Long term (current) use of insulin: Secondary | ICD-10-CM | POA: Insufficient documentation

## 2023-04-29 DIAGNOSIS — Z4931 Encounter for adequacy testing for hemodialysis: Secondary | ICD-10-CM | POA: Diagnosis present

## 2023-04-29 DIAGNOSIS — Z992 Dependence on renal dialysis: Secondary | ICD-10-CM | POA: Diagnosis not present

## 2023-04-29 LAB — CBC
HCT: 23.4 % — ABNORMAL LOW (ref 39.0–52.0)
Hemoglobin: 7.3 g/dL — ABNORMAL LOW (ref 13.0–17.0)
MCH: 27.3 pg (ref 26.0–34.0)
MCHC: 31.2 g/dL (ref 30.0–36.0)
MCV: 87.6 fL (ref 80.0–100.0)
Platelets: 131 10*3/uL — ABNORMAL LOW (ref 150–400)
RBC: 2.67 MIL/uL — ABNORMAL LOW (ref 4.22–5.81)
RDW: 14.3 % (ref 11.5–15.5)
WBC: 6.4 10*3/uL (ref 4.0–10.5)
nRBC: 0 % (ref 0.0–0.2)

## 2023-04-29 LAB — CBG MONITORING, ED: Glucose-Capillary: 232 mg/dL — ABNORMAL HIGH (ref 70–99)

## 2023-04-29 LAB — RENAL FUNCTION PANEL
Albumin: 3.3 g/dL — ABNORMAL LOW (ref 3.5–5.0)
Anion gap: 11 (ref 5–15)
BUN: 68 mg/dL — ABNORMAL HIGH (ref 8–23)
CO2: 29 mmol/L (ref 22–32)
Calcium: 7.8 mg/dL — ABNORMAL LOW (ref 8.9–10.3)
Chloride: 95 mmol/L — ABNORMAL LOW (ref 98–111)
Creatinine, Ser: 4.95 mg/dL — ABNORMAL HIGH (ref 0.61–1.24)
GFR, Estimated: 12 mL/min — ABNORMAL LOW (ref >60)
Glucose, Bld: 185 mg/dL — ABNORMAL HIGH (ref 70–99)
Phosphorus: 4.8 mg/dL — ABNORMAL HIGH (ref 2.5–4.6)
Potassium: 3.3 mmol/L — ABNORMAL LOW (ref 3.5–5.1)
Sodium: 135 mmol/L (ref 135–145)

## 2023-04-29 MED ORDER — HEPARIN SODIUM (PORCINE) 1000 UNIT/ML DIALYSIS
2000.0000 [IU] | Freq: Once | INTRAMUSCULAR | Status: AC
  Administered 2023-04-29: 2000 [IU] via INTRAVENOUS_CENTRAL

## 2023-04-29 MED ORDER — DARBEPOETIN ALFA 100 MCG/0.5ML IJ SOSY
100.0000 ug | PREFILLED_SYRINGE | Freq: Once | INTRAMUSCULAR | Status: AC
  Administered 2023-04-29: 100 ug via SUBCUTANEOUS
  Filled 2023-04-29: qty 0.5

## 2023-04-29 MED ORDER — SODIUM CHLORIDE 0.9 % IV SOLN
250.0000 mg | Freq: Once | INTRAVENOUS | Status: AC
  Administered 2023-04-29: 250 mg via INTRAVENOUS
  Filled 2023-04-29: qty 250

## 2023-04-29 MED ORDER — HEPARIN SODIUM (PORCINE) 1000 UNIT/ML IJ SOLN
4200.0000 [IU] | Freq: Once | INTRAMUSCULAR | Status: AC
  Administered 2023-04-29: 4200 [IU]
  Filled 2023-04-29: qty 4.2

## 2023-04-29 MED ORDER — CHLORHEXIDINE GLUCONATE CLOTH 2 % EX PADS: 6.0000 | MEDICATED_PAD | Freq: Every day | CUTANEOUS | Status: DC

## 2023-04-29 NOTE — ED Triage Notes (Signed)
Pt present to ED from home for dialysis treatment. Pt A&Ox4 at this time. Denies pain/discomfort.

## 2023-04-29 NOTE — Progress Notes (Signed)
Received patient in bed to unit.  Alert and oriented.  Informed consent signed and in chart.   TX duration:3.25  Patient tolerated well.  Transported back to the room  Alert, without acute distress.  Hand-off given to patient's nurse.   Access used: left [facility] Access issues: none  Total UF removed: 2.6L Medication(s) given: ferric gluconate, arenesp   04/29/23 1337  Vitals  BP (!) 150/72  MAP (mmHg) 88  BP Location Right Wrist  BP Method Automatic  Patient Position (if appropriate) Lying  Pulse Rate 81  Pulse Rate Source Monitor  ECG Heart Rate 82  Resp 15  Oxygen Therapy  SpO2 100 %  O2 Device Nasal Cannula  O2 Flow Rate (L/min) 3 L/min  During Treatment Monitoring  Intra-Hemodialysis Comments Tx completed  Dialysis Fluid Bolus Normal Saline  Bolus Amount (mL) 300 mL  Hemodialysis Catheter Left Internal jugular Double lumen Permanent (Tunneled)  Placement Date/Time: 04/07/23 1350   Placed prior to admission: No  Serial / Lot #: [id number]  Expiration Date: 03/11/27  Time Out: Correct patient;Correct site;Correct procedure  Maximum sterile barrier precautions: Hand hygiene;Cap;Mask;Sterile gow...  Site Condition No complications  Blue Lumen Status Flushed;Heparin locked  Red Lumen Status Flushed;Heparin locked  Purple Lumen Status N/A  Catheter fill solution Heparin 1000 units/ml  Catheter fill volume (Arterial) 2.1 cc  Catheter fill volume (Venous) 2.1  Dressing Type Transparent  Dressing Status Antimicrobial disc in place;Clean, Dry, Intact  Drainage Description None  Dressing Change Due 05/04/23  Post treatment catheter status Capped and Clamped      Osama Coleson [facility]

## 2023-04-29 NOTE — Procedures (Signed)
We are asked to see this patient for hospital dialysis. Pt was discharged from his OP HD unit in December 2023 due to behavioral issues. Pt came to ED today requesting hospital dialysis. Plan is for HD upstairs then will return to ED for reassessment and expected discharge home.      Last OP HD orders (dec 2023):  3:15h  129kg  RUE AVG   Heparin 2000    - last hep B labs here were from 04/07/23   Anemia of esrd:  Tsat was low on 5/14 --> pharm suggested ferric gluconate 250mg  q HD x 4.  3nd dose of 4 will be given 250mg  today w/ HD.   Pt had darbe 6 days ago but Hb has been dropping significantly so will give order his next dose today and ^ to 100 mcg.  We will try to continue darbe 100 mcg weekly sq while here for HD and hopefully his Hb will respond. The darbepoeitin needs to ordered for this patient as "once in dialysis" and not "SQ at 1800" because he needs to be ready to leave at 5 pm.       Date             Hb         Tsat/ ferritin    Darbe SQ     Nulecit 12/02/22            11.0       22%/ 935 01/24/23           9.0        31%/ 1056  02/16/23            8.8                                                         03/14/23            8.1                                                                        03/16/23            8.5                             60 mcg 03/30/23            8.0                             60 mcg  04/08/23            8.0  04/11/23            8.1        11%/ 545      100 mcg 5/18  250mg  #1 5/25                 7.5 04/24/23      8.0                             60 mcg  04/27/23  7.3      250mg  #2 04/29/23    100 mcg 250mg  #3 ordered   I was present at this dialysis session, have reviewed the session and made  appropriate changes Vinson Moselle MD  [facility] 04/29/2023, 10:57 AM

## 2023-04-29 NOTE — ED Provider Notes (Signed)
Patient came back to the ER after dialysis but left without me being able to see him.   Pricilla Loveless, MD 04/29/23 [phone]

## 2023-04-29 NOTE — ED Provider Notes (Signed)
[place] EMERGENCY DEPARTMENT AT [facility] Provider Note   CSN: [id number] Arrival date & time: 04/29/23  4098     History  Chief Complaint  Patient presents with   Vascular Access Problem    Jeffrey [name] is a 63 y.o. male.  HPI 62 year old male presents for his dialysis.  He does dialysis Tuesday, Thursday, Saturday.  Last had dialysis 2 days ago.  He has no acute complaints.  He has chronic dyspnea that is unchanged.  He feels like the edema is improving.  His glucose was 470 before breakfast this morning and he took 140 units of insulin.  Denies any chest pain, vomiting, or any other acute complaints.  Home Medications Prior to Admission medications   Medication Sig Start Date End Date Taking? Authorizing Provider  amLODipine (NORVASC) 10 MG tablet Take 10 mg by mouth every morning. 04/17/20   [provider]  ASPIRIN LOW DOSE 81 MG EC tablet Take 81 mg by mouth in the morning. 02/22/21   [provider]  atorvastatin (LIPITOR) 40 MG tablet Take 40 mg by mouth in the morning. 02/09/21   [provider]  calcium acetate (PHOSLO) 667 MG capsule Take 2 capsules (1,334 mg total) by mouth with breakfast, with lunch, and with evening meal. 04/10/21   Calvert Cantor, MD  Calcium Carbonate Antacid (CALCIUM CARBONATE, DOSED IN MG ELEMENTAL CALCIUM,) 1250 MG/5ML SUSP Take 5 mLs (500 mg of elemental calcium total) by mouth every 6 (six) hours as needed for indigestion. 04/08/23   Rhetta Mura, MD  carvedilol (COREG) 12.5 MG tablet Take 12.5 mg by mouth 2 (two) times daily with a meal. 04/17/20   [provider]  Cholecalciferol (VITAMIN D) 50 MCG (2000 UT) CAPS Take 2,000 Units by mouth daily.    [provider]  clopidogrel (PLAVIX) 75 MG tablet Take 1 tablet (75 mg total) by mouth daily. 11/02/22   Rhyne, Ames Coupe, PA-C  cyclobenzaprine (FLEXERIL) 10 MG tablet Take 10 mg by mouth 3 (three) times daily as needed for muscle spasms.  12/29/21   [provider]  diazepam (VALIUM) 5 MG tablet Take 5 mg by mouth 2 (two) times daily as needed for muscle spasms. 12/09/21   [provider]  insulin regular human CONCENTRATED (HUMULIN R U-500 KWIKPEN) 500 UNIT/ML KwikPen Inject 45 Units into the skin daily with supper. Patient taking differently: Inject 70-125 Units into the skin 2 (two) times daily with a meal. 09/04/21 08/30/24  [name], MD  metolazone (ZAROXOLYN) 10 MG tablet Take 10 mg by mouth daily. 07/13/21   [provider]  nortriptyline (PAMELOR) 10 MG capsule Take 20 mg by mouth at bedtime. 10/23/19 08/30/24  [provider]  oxyCODONE (ROXICODONE) 5 MG immediate release tablet Take 1 tablet (5 mg total) by mouth every 8 (eight) hours as needed. Patient taking differently: Take 5 mg by mouth every 8 (eight) hours as needed for moderate pain. 12/31/22 12/31/23  [name], [name], DO  pantoprazole (PROTONIX) 40 MG tablet Take 40 mg by mouth daily before breakfast. 02/09/21   [provider]  pregabalin (LYRICA) 75 MG capsule Take 1 capsule (75 mg total) by mouth See admin instructions. Daily.  Give after dialysis on dialysis days. 01/21/23   [name], MD  sertraline (ZOLOFT) 100 MG tablet Take 100 mg by mouth in the morning. 02/22/21   [provider]  tamsulosin (FLOMAX) 0.4 MG CAPS capsule Take 1 capsule (0.4 mg total)  by mouth daily. 11/10/21   [name], MD  torsemide (DEMADEX) 20 MG tablet Take 120 mg by mouth 2 (two) times daily.    [provider]  traZODone (DESYREL) 150 MG tablet Take 150 mg by mouth at bedtime. 12/09/21   [provider]      Allergies    Actos [pioglitazone], Dexmedetomidine, Ibuprofen, Tomato, and Wellbutrin [bupropion]    Review of Systems   Review of Systems  Constitutional:  Negative for fever.  Respiratory:  Negative for shortness of breath.   Cardiovascular:  Negative for chest pain.    Physical Exam Updated  Vital Signs BP (!) 158/78   Pulse 100   Temp 98.1 F (36.7 C)   Resp 20   Ht 5\' 5"  (1.651 m)   Wt 133 kg   SpO2 98%   BMI 48.79 kg/m  Physical Exam Vitals and nursing note reviewed.  Constitutional:      General: He is not in acute distress.    Appearance: He is well-developed. He is obese. He is not ill-appearing or diaphoretic.  HENT:     Head: Normocephalic and atraumatic.  Cardiovascular:     Rate and Rhythm: Normal rate and regular rhythm.     Heart sounds: Normal heart sounds.  Pulmonary:     Effort: Pulmonary effort is normal.     Breath sounds: Normal breath sounds. No wheezing.  Skin:    General: Skin is warm and dry.  Neurological:     Mental Status: He is alert.     ED Results / Procedures / Treatments   Labs (all labs ordered are listed, but only abnormal results are displayed) Labs Reviewed  CBG MONITORING, ED - Abnormal; Notable for the following components:      Result Value   Glucose-Capillary 232 (*)    All other components within normal limits    EKG None  Radiology No results found.  Procedures Procedures    Medications Ordered in ED Medications - No data to display  ED Course/ Medical Decision Making/ A&P                             Medical Decision Making Amount and/or Complexity of Data Reviewed External Data Reviewed: notes. Labs: ordered.    Details: CBG 232, elevated but better than more recent values.   Patient presents for his typical dialysis session.  He has been dealing with hyperglycemia recently though today's value is much better than before.  I do not think labs are needed.  Discussed case with nephrology Dr. Arlean Hopping, who will dialyze.  Given he had other labs a few days ago, no labs needed per nephrology.        Final Clinical Impression(s) / ED Diagnoses Final diagnoses:  ESRD (end stage renal disease) on dialysis [facility])    Rx / DC Orders ED Discharge Orders     None         Pricilla Loveless,  MD 04/29/23 0830

## 2023-05-04 ENCOUNTER — Other Ambulatory Visit: Payer: Self-pay

## 2023-05-04 ENCOUNTER — Emergency Department (HOSPITAL_COMMUNITY)
Admission: EM | Admit: 2023-05-04 | Discharge: 2023-05-04 | Discharge status: Against Medical Advice | Payer: 59 | Attending: Emergency Medicine | Admitting: Emergency Medicine

## 2023-05-04 ENCOUNTER — Encounter (HOSPITAL_COMMUNITY): Payer: Self-pay

## 2023-05-04 DIAGNOSIS — Z992 Dependence on renal dialysis: Secondary | ICD-10-CM | POA: Diagnosis not present

## 2023-05-04 DIAGNOSIS — N186 End stage renal disease: Secondary | ICD-10-CM | POA: Insufficient documentation

## 2023-05-04 DIAGNOSIS — Z7982 Long term (current) use of aspirin: Secondary | ICD-10-CM | POA: Insufficient documentation

## 2023-05-04 DIAGNOSIS — R600 Localized edema: Secondary | ICD-10-CM | POA: Diagnosis not present

## 2023-05-04 DIAGNOSIS — M7989 Other specified soft tissue disorders: Secondary | ICD-10-CM | POA: Insufficient documentation

## 2023-05-04 DIAGNOSIS — Z794 Long term (current) use of insulin: Secondary | ICD-10-CM | POA: Diagnosis not present

## 2023-05-04 DIAGNOSIS — R059 Cough, unspecified: Secondary | ICD-10-CM | POA: Diagnosis not present

## 2023-05-04 LAB — RENAL FUNCTION PANEL
Albumin: 3.2 g/dL — ABNORMAL LOW (ref 3.5–5.0)
Anion gap: 16 — ABNORMAL HIGH (ref 5–15)
BUN: 94 mg/dL — ABNORMAL HIGH (ref 8–23)
CO2: 25 mmol/L (ref 22–32)
Calcium: 7.5 mg/dL — ABNORMAL LOW (ref 8.9–10.3)
Chloride: 92 mmol/L — ABNORMAL LOW (ref 98–111)
Creatinine, Ser: 5.41 mg/dL — ABNORMAL HIGH (ref 0.61–1.24)
GFR, Estimated: 11 mL/min — ABNORMAL LOW (ref >60)
Glucose, Bld: 232 mg/dL — ABNORMAL HIGH (ref 70–99)
Phosphorus: 4.5 mg/dL (ref 2.5–4.6)
Potassium: 3.3 mmol/L — ABNORMAL LOW (ref 3.5–5.1)
Sodium: 133 mmol/L — ABNORMAL LOW (ref 135–145)

## 2023-05-04 LAB — CBC
HCT: 23.9 % — ABNORMAL LOW (ref 39.0–52.0)
Hemoglobin: 7.5 g/dL — ABNORMAL LOW (ref 13.0–17.0)
MCH: 27.8 pg (ref 26.0–34.0)
MCHC: 31.4 g/dL (ref 30.0–36.0)
MCV: 88.5 fL (ref 80.0–100.0)
Platelets: 133 10*3/uL — ABNORMAL LOW (ref 150–400)
RBC: 2.7 MIL/uL — ABNORMAL LOW (ref 4.22–5.81)
RDW: 14.7 % (ref 11.5–15.5)
WBC: 7.7 10*3/uL (ref 4.0–10.5)
nRBC: 0 % (ref 0.0–0.2)

## 2023-05-04 MED ORDER — CHLORHEXIDINE GLUCONATE CLOTH 2 % EX PADS: 6.0000 | MEDICATED_PAD | Freq: Every day | CUTANEOUS | Status: DC

## 2023-05-04 MED ORDER — DARBEPOETIN ALFA 150 MCG/0.3ML IJ SOSY
150.0000 ug | PREFILLED_SYRINGE | Freq: Once | INTRAMUSCULAR | Status: AC
  Administered 2023-05-04: 150 ug via SUBCUTANEOUS
  Filled 2023-05-04: qty 0.3

## 2023-05-04 NOTE — ED Provider Notes (Signed)
[place] EMERGENCY DEPARTMENT AT [facility] Provider Note   CSN: [id number] Arrival date & time: 05/04/23  7829     History  No chief complaint on file.   Jeffrey [name] is a 62 y.o. male.  Patient with history of ESRD presents to the emergency department today for routine dialysis.  He is on chronic oxygen, 3 L, at baseline.  He missed his most recent dialysis session and last dialyzed 5 days ago.  He reports that generally "feeling pretty good".  He states that his breathing may be slightly more labored than baseline.  He has some swelling in his right leg.  Occasional cough without fever.  No other complaints.       Home Medications Prior to Admission medications   Medication Sig Start Date End Date Taking? Authorizing Provider  amLODipine (NORVASC) 10 MG tablet Take 10 mg by mouth every morning. 04/17/20   [provider]  ASPIRIN LOW DOSE 81 MG EC tablet Take 81 mg by mouth in the morning. 02/22/21   [provider]  atorvastatin (LIPITOR) 40 MG tablet Take 40 mg by mouth in the morning. 02/09/21   [provider]  calcium acetate (PHOSLO) 667 MG capsule Take 2 capsules (1,334 mg total) by mouth with breakfast, with lunch, and with evening meal. 04/10/21   Calvert Cantor, MD  Calcium Carbonate Antacid (CALCIUM CARBONATE, DOSED IN MG ELEMENTAL CALCIUM,) 1250 MG/5ML SUSP Take 5 mLs (500 mg of elemental calcium total) by mouth every 6 (six) hours as needed for indigestion. 04/08/23   Rhetta Mura, MD  carvedilol (COREG) 12.5 MG tablet Take 12.5 mg by mouth 2 (two) times daily with a meal. 04/17/20   [provider]  Cholecalciferol (VITAMIN D) 50 MCG (2000 UT) CAPS Take 2,000 Units by mouth daily.    [provider]  clopidogrel (PLAVIX) 75 MG tablet Take 1 tablet (75 mg total) by mouth daily. 11/02/22   Rhyne, Ames Coupe, PA-C  cyclobenzaprine (FLEXERIL) 10 MG tablet Take 10 mg by mouth 3 (three) times daily as needed for  muscle spasms. 12/29/21   [provider]  diazepam (VALIUM) 5 MG tablet Take 5 mg by mouth 2 (two) times daily as needed for muscle spasms. 12/09/21   [provider]  insulin regular human CONCENTRATED (HUMULIN R U-500 KWIKPEN) 500 UNIT/ML KwikPen Inject 45 Units into the skin daily with supper. Patient taking differently: Inject 70-125 Units into the skin 2 (two) times daily with a meal. 09/04/21 08/30/24  [name], MD  metolazone (ZAROXOLYN) 10 MG tablet Take 10 mg by mouth daily. 07/13/21   [provider]  nortriptyline (PAMELOR) 10 MG capsule Take 20 mg by mouth at bedtime. 10/23/19 08/30/24  [provider]  oxyCODONE (ROXICODONE) 5 MG immediate release tablet Take 1 tablet (5 mg total) by mouth every 8 (eight) hours as needed. Patient taking differently: Take 5 mg by mouth every 8 (eight) hours as needed for moderate pain. 12/31/22 12/31/23  [name], [name], DO  pantoprazole (PROTONIX) 40 MG tablet Take 40 mg by mouth daily before breakfast. 02/09/21   [provider]  pregabalin (LYRICA) 75 MG capsule Take 1 capsule (75 mg total) by mouth See admin instructions. Daily.  Give after dialysis on dialysis days. 01/21/23   [name], MD  sertraline (ZOLOFT) 100 MG tablet Take 100 mg by mouth in the morning. 02/22/21   [provider]  tamsulosin (FLOMAX) 0.4 MG CAPS capsule Take 1 capsule (  0.4 mg total) by mouth daily. 11/10/21   [name], MD  torsemide (DEMADEX) 20 MG tablet Take 120 mg by mouth 2 (two) times daily.    [provider]  traZODone (DESYREL) 150 MG tablet Take 150 mg by mouth at bedtime. 12/09/21   [provider]      Allergies    Actos [pioglitazone], Dexmedetomidine, Ibuprofen, Tomato, and Wellbutrin [bupropion]    Review of Systems   Review of Systems  Physical Exam Updated Vital Signs There were no vitals taken for this visit. Physical Exam Vitals and nursing note reviewed.  Constitutional:       General: He is not in acute distress.    Appearance: He is well-developed.  HENT:     Head: Normocephalic and atraumatic.     Nose: Nose normal.     Mouth/Throat:     Mouth: Mucous membranes are moist.  Eyes:     General:        Right eye: No discharge.        Left eye: No discharge.     Conjunctiva/sclera: Conjunctivae normal.  Cardiovascular:     Rate and Rhythm: Normal rate and regular rhythm.     Heart sounds: Normal heart sounds.  Pulmonary:     Effort: Pulmonary effort is normal.     Breath sounds: Normal breath sounds. No wheezing, rhonchi or rales.     Comments: Patient speaks in full sentences, unlabored without increased work of breathing Abdominal:     Palpations: Abdomen is soft.     Tenderness: There is no abdominal tenderness. There is no guarding or rebound.  Musculoskeletal:     Cervical back: Normal range of motion and neck supple.     Right lower leg: Edema present.  Skin:    General: Skin is warm and dry.  Neurological:     Mental Status: He is alert.     ED Results / Procedures / Treatments   Labs (all labs ordered are listed, but only abnormal results are displayed) Labs Reviewed  CBC - Abnormal; Notable for the following components:      Result Value   RBC 2.70 (*)    Hemoglobin 7.5 (*)    HCT 23.9 (*)    Platelets 133 (*)    All other components within normal limits  RENAL FUNCTION PANEL - Abnormal; Notable for the following components:   Sodium 133 (*)    Potassium 3.3 (*)    Chloride 92 (*)    Glucose, Bld 232 (*)    BUN 94 (*)    Creatinine, Ser 5.41 (*)    Calcium 7.5 (*)    Albumin 3.2 (*)    GFR, Estimated 11 (*)    Anion gap 16 (*)    All other components within normal limits  HEPATITIS B SURFACE ANTIGEN  HEPATITIS B SURFACE ANTIBODY, QUANTITATIVE    EKG None  Radiology No results found.  Procedures Procedures    Medications Ordered in ED Medications - No data to display  ED Course/ Medical Decision Making/  A&P    Patient seen and examined. History obtained directly from patient.  Reviewed most recent ED note.  Will send this basic labs and get patient set up for dialysis.  Labs/EKG: Ordered CBC, renal panel  Imaging: None ordered  Medications/Fluids: None ordered  Initial impression: ESRD, need for routine dialysis  8:16 AM Spoke with Dr. Abel Presto who is aware that patient is in the ED for routine dialysis.  2:00 PM patient has been stable during ED stay.  I was at bedside when transport came to take the patient to dialysis.  He is currently refusing.  He states that by the time he is done it will be after 5:00 and he does not have a ride home at that time.  He is refusing dialysis today and would like to be discharged and return on Saturday for routine dialysis.  I discussed this with patient and he agrees to leave AGAINST MEDICAL ADVICE.  Discussed that if he changes his mind he should return to the emergency department or if he worsens, has difficulty breathing, chest pain or other concerns.  He agrees to do so.  Labs personally reviewed and interpreted including: CBC with hemoglobin of 7.5 which is stable for the patient in setting of chronic kidney disease, normal white blood cell count; renal function panel with potassium of 3.3, glucose elevated at 232, creatinine elevated as expected at 5.41 with BUN elevated of 94.  Most current vital signs reviewed and are as follows: BP (!) 145/70 (BP Location: Right Arm)   Pulse 85   Temp 97.9 F (36.6 C) (Oral)   Resp 19   Ht 5\' 5"  (1.651 m)   Wt 48.7 kg   SpO2 100%   BMI 17.87 kg/m   Plan: Patient will leave AGAINST MEDICAL ADVICE.  Discussion as above.                             Medical Decision Making Amount and/or Complexity of Data Reviewed Labs: ordered.   Patient here for routine dialysis.  He has not had dialysis now in 5 days.  He does not appear to be free appreciably fluid overloaded.  His lungs are clear on exam.   No tachycardia or hypoxia.  His BUN is a bit elevated from baseline however he appears appropriately oriented and able to make medical decisions today.  He has good insight into his decisions and has decided to leave because of transportation issues.  He agrees to return with worsening or on Saturday for dialysis.        Final Clinical Impression(s) / ED Diagnoses Final diagnoses:  ESRD (end stage renal disease) [facility])    Rx / DC Orders ED Discharge Orders     None         Renne Crigler, PA-C 05/04/23 1459    Melene Plan, DO 05/04/23 1533

## 2023-05-04 NOTE — Progress Notes (Signed)
ED notified to get report on patient around 1300, report received machine set up and waited for patient, ED nurse notified dialysis unit that patient is going home and do not want to be dialyzed today.

## 2023-05-04 NOTE — ED Triage Notes (Signed)
Pt present to ED from home for dialysis treatment. Pt denies pain/discomfort at this time. Pt A&Ox4 at this time.

## 2023-05-04 NOTE — Discharge Instructions (Signed)
Please follow-up as soon as possible to get your dialysis done.  Return if you start feeling confused, have chest pain, shortness of breath, or change your mind about getting dialysis.

## 2023-05-04 NOTE — Progress Notes (Addendum)
Patient presents to ED for HD after being expelled from HD clinic D/T behavioral issues. No HD since 04/29/2023. Acidotic but otherwise, labs not bad. HGB 7.3 has been in this range last 3 treatments. Increase ESA dose and give today.  Will order HD as extended OP. He will return to ED post HD.   Alonna Buckler [facility] [phone]

## 2023-05-06 ENCOUNTER — Emergency Department (HOSPITAL_COMMUNITY)
Admission: EM | Admit: 2023-05-06 | Discharge: 2023-05-06 | Disposition: A | Discharge status: Home / Self Care | Payer: 59 | Attending: Emergency Medicine | Admitting: Emergency Medicine

## 2023-05-06 ENCOUNTER — Encounter (HOSPITAL_COMMUNITY): Payer: Self-pay

## 2023-05-06 ENCOUNTER — Emergency Department (HOSPITAL_COMMUNITY): Payer: 59

## 2023-05-06 ENCOUNTER — Other Ambulatory Visit: Payer: Self-pay

## 2023-05-06 DIAGNOSIS — Z7982 Long term (current) use of aspirin: Secondary | ICD-10-CM | POA: Diagnosis not present

## 2023-05-06 DIAGNOSIS — E1165 Type 2 diabetes mellitus with hyperglycemia: Secondary | ICD-10-CM | POA: Insufficient documentation

## 2023-05-06 DIAGNOSIS — Z992 Dependence on renal dialysis: Secondary | ICD-10-CM | POA: Insufficient documentation

## 2023-05-06 DIAGNOSIS — I120 Hypertensive chronic kidney disease with stage 5 chronic kidney disease or end stage renal disease: Secondary | ICD-10-CM | POA: Insufficient documentation

## 2023-05-06 DIAGNOSIS — Z794 Long term (current) use of insulin: Secondary | ICD-10-CM | POA: Insufficient documentation

## 2023-05-06 DIAGNOSIS — Z7902 Long term (current) use of antithrombotics/antiplatelets: Secondary | ICD-10-CM | POA: Diagnosis not present

## 2023-05-06 DIAGNOSIS — M25532 Pain in left wrist: Secondary | ICD-10-CM | POA: Diagnosis not present

## 2023-05-06 DIAGNOSIS — N186 End stage renal disease: Secondary | ICD-10-CM | POA: Diagnosis not present

## 2023-05-06 DIAGNOSIS — E1122 Type 2 diabetes mellitus with diabetic chronic kidney disease: Secondary | ICD-10-CM | POA: Insufficient documentation

## 2023-05-06 DIAGNOSIS — Z79899 Other long term (current) drug therapy: Secondary | ICD-10-CM | POA: Insufficient documentation

## 2023-05-06 DIAGNOSIS — R739 Hyperglycemia, unspecified: Secondary | ICD-10-CM

## 2023-05-06 LAB — CBC WITH DIFFERENTIAL/PLATELET
Abs Immature Granulocytes: 0.22 10*3/uL — ABNORMAL HIGH (ref 0.00–0.07)
Basophils Absolute: 0 10*3/uL (ref 0.0–0.1)
Basophils Relative: 0 %
Eosinophils Absolute: 0.2 10*3/uL (ref 0.0–0.5)
Eosinophils Relative: 2 %
HCT: 25 % — ABNORMAL LOW (ref 39.0–52.0)
Hemoglobin: 7.9 g/dL — ABNORMAL LOW (ref 13.0–17.0)
Immature Granulocytes: 3 %
Lymphocytes Relative: 9 %
Lymphs Abs: 0.7 10*3/uL (ref 0.7–4.0)
MCH: 28.2 pg (ref 26.0–34.0)
MCHC: 31.6 g/dL (ref 30.0–36.0)
MCV: 89.3 fL (ref 80.0–100.0)
Monocytes Absolute: 0.6 10*3/uL (ref 0.1–1.0)
Monocytes Relative: 7 %
Neutro Abs: 6.3 10*3/uL (ref 1.7–7.7)
Neutrophils Relative %: 79 %
Platelets: 134 10*3/uL — ABNORMAL LOW (ref 150–400)
RBC: 2.8 MIL/uL — ABNORMAL LOW (ref 4.22–5.81)
RDW: 14.7 % (ref 11.5–15.5)
WBC: 8 10*3/uL (ref 4.0–10.5)
nRBC: 0.3 % — ABNORMAL HIGH (ref 0.0–0.2)

## 2023-05-06 LAB — BASIC METABOLIC PANEL
Anion gap: 18 — ABNORMAL HIGH (ref 5–15)
BUN: 105 mg/dL — ABNORMAL HIGH (ref 8–23)
CO2: 27 mmol/L (ref 22–32)
Calcium: 7.5 mg/dL — ABNORMAL LOW (ref 8.9–10.3)
Chloride: 91 mmol/L — ABNORMAL LOW (ref 98–111)
Creatinine, Ser: 5.36 mg/dL — ABNORMAL HIGH (ref 0.61–1.24)
GFR, Estimated: 11 mL/min — ABNORMAL LOW (ref >60)
Glucose, Bld: 280 mg/dL — ABNORMAL HIGH (ref 70–99)
Potassium: 3.6 mmol/L (ref 3.5–5.1)
Sodium: 136 mmol/L (ref 135–145)

## 2023-05-06 LAB — HEPATITIS B SURFACE ANTIGEN: Hepatitis B Surface Ag: NONREACTIVE

## 2023-05-06 LAB — CBG MONITORING, ED: Glucose-Capillary: 312 mg/dL — ABNORMAL HIGH (ref 70–99)

## 2023-05-06 MED ORDER — SODIUM CHLORIDE 0.9 % IV SOLN
250.0000 mg | Freq: Once | INTRAVENOUS | Status: AC
  Administered 2023-05-06: 250 mg via INTRAVENOUS
  Filled 2023-05-06: qty 250

## 2023-05-06 MED ORDER — HEPARIN SODIUM (PORCINE) 1000 UNIT/ML IJ SOLN
INTRAMUSCULAR | Status: AC
  Administered 2023-05-06: 2000 [IU]
  Filled 2023-05-06: qty 2

## 2023-05-06 MED ORDER — CHLORHEXIDINE GLUCONATE CLOTH 2 % EX PADS: 6.0000 | MEDICATED_PAD | Freq: Every day | CUTANEOUS | Status: DC

## 2023-05-06 MED ORDER — ACETAMINOPHEN 325 MG PO TABS
650.0000 mg | ORAL_TABLET | Freq: Once | ORAL | Status: AC
  Administered 2023-05-06: 650 mg via ORAL
  Filled 2023-05-06: qty 2

## 2023-05-06 MED ORDER — HEPARIN SODIUM (PORCINE) 1000 UNIT/ML DIALYSIS: 1000.0000 [IU] | INTRAMUSCULAR | Status: DC | PRN

## 2023-05-06 MED ORDER — PENTAFLUOROPROP-TETRAFLUOROETH EX AERO
1.0000 | INHALATION_SPRAY | CUTANEOUS | Status: DC | PRN
Start: 2023-05-06 — End: 2023-05-06

## 2023-05-06 MED ORDER — INSULIN ASPART 100 UNIT/ML IJ SOLN
5.0000 [IU] | Freq: Once | INTRAMUSCULAR | Status: AC
  Administered 2023-05-06: 5 [IU] via SUBCUTANEOUS

## 2023-05-06 MED ORDER — ALTEPLASE 2 MG IJ SOLR: 2.0000 mg | Freq: Once | INTRAMUSCULAR | Status: DC | PRN

## 2023-05-06 MED ORDER — LIDOCAINE-PRILOCAINE 2.5-2.5 % EX CREA: 1.0000 | TOPICAL_CREAM | CUTANEOUS | Status: DC | PRN

## 2023-05-06 MED ORDER — LIDOCAINE HCL (PF) 1 % IJ SOLN: 5.0000 mL | INTRAMUSCULAR | Status: DC | PRN

## 2023-05-06 MED ORDER — HEPARIN SODIUM (PORCINE) 1000 UNIT/ML IJ SOLN
INTRAMUSCULAR | Status: AC
  Administered 2023-05-06: 4200 [IU]
  Filled 2023-05-06: qty 5

## 2023-05-06 NOTE — ED Provider Notes (Signed)
[place] EMERGENCY DEPARTMENT AT [facility] Provider Note   CSN: [id number] Arrival date & time: 05/06/23  4098     History  No chief complaint on file.   Jeffrey [name] is a 62 y.o. male.  Patient is a 62 year old male with a past medical history of ESRD without home dialysis center, hypertension and diabetes presenting to the emergency department for dialysis as well as with left wrist/hand pain.  Patient states that his last dialysis session was last Saturday.  He states that he was in the emergency department on Thursday however due to the long wait time had to leave prior to receiving dialysis.  He reports that he has been feeling well from a dialysis perspective with no significant shortness of breath.  The patient also reports on Wednesday that he fell and tried to catch himself with his left hand and has been having pain in his left hand and wrist since with some swelling.  He states that he has been taking Tylenol for pain at home.  He states that he is right-handed.  He denies any numbness or weakness.  The history is provided by the patient.       Home Medications Prior to Admission medications   Medication Sig Start Date End Date Taking? Authorizing Provider  amLODipine (NORVASC) 10 MG tablet Take 10 mg by mouth every morning. 04/17/20   [provider]  ASPIRIN LOW DOSE 81 MG EC tablet Take 81 mg by mouth in the morning. 02/22/21   [provider]  atorvastatin (LIPITOR) 40 MG tablet Take 40 mg by mouth in the morning. 02/09/21   [provider]  calcium acetate (PHOSLO) 667 MG capsule Take 2 capsules (1,334 mg total) by mouth with breakfast, with lunch, and with evening meal. 04/10/21   Calvert Cantor, MD  Calcium Carbonate Antacid (CALCIUM CARBONATE, DOSED IN MG ELEMENTAL CALCIUM,) 1250 MG/5ML SUSP Take 5 mLs (500 mg of elemental calcium total) by mouth every 6 (six) hours as needed for indigestion. 04/08/23   Rhetta Mura, MD   carvedilol (COREG) 12.5 MG tablet Take 12.5 mg by mouth 2 (two) times daily with a meal. 04/17/20   [provider]  Cholecalciferol (VITAMIN D) 50 MCG (2000 UT) CAPS Take 2,000 Units by mouth daily.    [provider]  clopidogrel (PLAVIX) 75 MG tablet Take 1 tablet (75 mg total) by mouth daily. 11/02/22   Rhyne, Ames Coupe, PA-C  cyclobenzaprine (FLEXERIL) 10 MG tablet Take 10 mg by mouth 3 (three) times daily as needed for muscle spasms. 12/29/21   [provider]  diazepam (VALIUM) 5 MG tablet Take 5 mg by mouth 2 (two) times daily as needed for muscle spasms. 12/09/21   [provider]  insulin regular human CONCENTRATED (HUMULIN R U-500 KWIKPEN) 500 UNIT/ML KwikPen Inject 45 Units into the skin daily with supper. Patient taking differently: Inject 70-125 Units into the skin 2 (two) times daily with a meal. 09/04/21 08/30/24  [name], MD  metolazone (ZAROXOLYN) 10 MG tablet Take 10 mg by mouth daily. 07/13/21   [provider]  nortriptyline (PAMELOR) 10 MG capsule Take 20 mg by mouth at bedtime. 10/23/19 08/30/24  [provider]  oxyCODONE (ROXICODONE) 5 MG immediate release tablet Take 1 tablet (5 mg total) by mouth every 8 (eight) hours as needed. Patient taking differently: Take 5 mg by mouth every 8 (eight) hours as needed for moderate pain. 12/31/22 12/31/23  [name], Eric J, DO  pantoprazole (PROTONIX) 40 MG tablet Take 40 mg by mouth daily before breakfast. 02/09/21   [provider]  pregabalin (LYRICA) 75 MG capsule Take 1 capsule (75 mg total) by mouth See admin instructions. Daily.  Give after dialysis on dialysis days. 01/21/23   [name], MD  sertraline (ZOLOFT) 100 MG tablet Take 100 mg by mouth in the morning. 02/22/21   [provider]  tamsulosin (FLOMAX) 0.4 MG CAPS capsule Take 1 capsule (0.4 mg total) by mouth daily. 11/10/21   [name], MD  torsemide (DEMADEX) 20 MG tablet Take 120 mg by mouth 2  (two) times daily.    [provider]  traZODone (DESYREL) 150 MG tablet Take 150 mg by mouth at bedtime. 12/09/21   [provider]      Allergies    Actos [pioglitazone], Dexmedetomidine, Ibuprofen, Tomato, and Wellbutrin [bupropion]    Review of Systems   Review of Systems  Physical Exam Updated Vital Signs BP (!) 137/48   Pulse 80   Temp 98.1 F (36.7 C)   Resp 19   SpO2 100%  Physical Exam Vitals and nursing note reviewed.  Constitutional:      General: He is not in acute distress.    Appearance: Normal appearance.  HENT:     Head: Normocephalic and atraumatic.     Nose: Nose normal.     Mouth/Throat:     Mouth: Mucous membranes are moist.  Eyes:     Extraocular Movements: Extraocular movements intact.  Cardiovascular:     Rate and Rhythm: Normal rate and regular rhythm.     Pulses: Normal pulses.     Heart sounds: Normal heart sounds.  Pulmonary:     Effort: Pulmonary effort is normal.     Breath sounds: Normal breath sounds.  Abdominal:     General: Abdomen is flat.  Musculoskeletal:        General: Normal range of motion.     Cervical back: Normal range of motion.     Comments: Tenderness to palpation of L 2nd metatarsal with overlying swelling, increased pain with wrist flexion/extension. Grip and finger abduction intact  Skin:    General: Skin is warm and dry.  Neurological:     Mental Status: He is alert and oriented to person, place, and time.     Sensory: No sensory deficit.     Motor: No weakness.     ED Results / Procedures / Treatments   Labs (all labs ordered are listed, but only abnormal results are displayed) Labs Reviewed  BASIC METABOLIC PANEL - Abnormal; Notable for the following components:      Result Value   Chloride 91 (*)    Glucose, Bld 280 (*)    BUN 105 (*)    Creatinine, Ser 5.36 (*)    Calcium 7.5 (*)    GFR, Estimated 11 (*)    Anion gap 18 (*)    All other components within normal limits  CBC WITH  DIFFERENTIAL/PLATELET - Abnormal; Notable for the following components:   RBC 2.80 (*)    Hemoglobin 7.9 (*)    HCT 25.0 (*)    Platelets 134 (*)    nRBC 0.3 (*)    Abs Immature Granulocytes 0.22 (*)    All other components within normal limits  CBG MONITORING, ED - Abnormal; Notable for the following components:   Glucose-Capillary 312 (*)    All other components within normal limits  HEPATITIS B SURFACE ANTIGEN  EKG None  Radiology DG Wrist Complete Left  Result Date: 05/06/2023 CLINICAL DATA:  Fall with left hand pain. EXAM: LEFT WRIST - COMPLETE 3+ VIEW COMPARISON:  None Available. FINDINGS: No evidence for an acute fracture. No subluxation or dislocation. Advanced degenerative changes are seen in the radiocarpal joint and in the mid carpus with some degenerative change noted at the first carpometacarpal joint. IMPRESSION: Degenerative changes without acute bony abnormality. Electronically Signed   By: Kennith Center M.D.   On: 05/06/2023 09:18   DG Hand Complete Left  Result Date: 05/06/2023 CLINICAL DATA:  Fall, left hand pain EXAM: LEFT HAND - COMPLETE 3+ VIEW COMPARISON:  None Available. FINDINGS: Generalized soft tissue swelling. No fracture or dislocation. Mild first carpometacarpal joint osteoarthritis. Moderate radiocarpal osteoarthritis with subchondral cystic changes. No radiopaque foreign bodies. IMPRESSION: 1. Generalized soft tissue swelling. No fracture or dislocation. 2. 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 Electronically Signed   By: Delbert Phenix M.D.   On: 05/06/2023 09:17    Procedures Procedures    Medications Ordered in ED Medications  Chlorhexidine Gluconate Cloth 2 % PADS 6 each (has no administration in time range)  pentafluoroprop-tetrafluoroeth (GEBAUERS) aerosol 1 Application (has no administration in time range)  lidocaine (PF) (XYLOCAINE) 1 % injection 5 mL (has no administration in time range)   lidocaine-prilocaine (EMLA) cream 1 Application (has no administration in time range)  heparin injection 1,000 Units (has no administration in time range)  alteplase (CATHFLO ACTIVASE) injection 2 mg (has no administration in time range)  acetaminophen (TYLENOL) tablet 650 mg (650 mg Oral Given 05/06/23 1057)  ferric gluconate (FERRLECIT) 250 mg in sodium chloride 0.9 % 250 mL IVPB (250 mg Intravenous New Bag/Given 05/06/23 1303)  insulin aspart (novoLOG) injection 5 Units (5 Units Subcutaneous Given 05/06/23 1058)  heparin sodium (porcine) 1000 UNIT/ML injection (2,000 Units  Given 05/06/23 1215)  heparin sodium (porcine) 1000 UNIT/ML injection (4,200 Units  Given 05/06/23 1407)    ED Course/ Medical Decision Making/ A&P Clinical Course as of 05/06/23 1516  Sat May 06, 2023  1610 I spoke with Dr. Arlean Hopping of nephrology who will add him to the HD schedule today. [VK]  [id number] Degenerative changes on XR without acute traumatic injury [VK]  0945 Labs with mild hyperglycemia and AGAP likely related to uremia. Pending HD at this time. [VK]  1515 Patient signed out to Dr. Jearld Fenton pending reassessment after HD. [VK]    Clinical Course User Index [VK] Rexford Maus, DO                             Medical Decision Making This patient presents to the ED with chief complaint(s) of missed HD, L hand pain with pertinent past medical history of ESRD, HTN, DM which further complicates the presenting complaint. The complaint involves an extensive differential diagnosis and also carries with it a high risk of complications and morbidity.    The differential diagnosis includes electrolyte derangement, volume overload, wrist/hand fracture or sprain  Additional history obtained: Additional history obtained from N/A Records reviewed prior ED records  ED Course and Reassessment: On patient's arrival to the emergency department he is hemodynamically stable in no acute distress.  He is complaining of some left  wrist pain.  He is neurovascularly intact.  He will have x-ray of his hand and wrist to evaluate for acute traumatic injury and was given Tylenol for pain.  Will additionally have  labs performed to evaluate for electrolyte derangements in the setting of his missed dialysis.  He does not appear significantly volume overloaded and has no respiratory symptoms.  I plan to reach out to nephrology to facilitate dialysis today.  Independent labs interpretation:  The following labs were independently interpreted: hyperglycemia, mild metabolic acidosis, uremia, anemia at baseline  Independent visualization of imaging: - I independently visualized the following imaging with scope of interpretation limited to determining acute life threatening conditions related to emergency care: L hand/wrist XR, which revealed no acute traumatic injury  Consultation: - Consulted or discussed management/test interpretation w/ external professional: nephrology   Social Determinants of health: No outpatient HD center    Amount and/or Complexity of Data Reviewed Labs: ordered. Radiology: ordered.  Risk OTC drugs. Prescription drug management.          Final Clinical Impression(s) / ED Diagnoses Final diagnoses:  Encounter for hemodialysis for end-stage renal disease (HCC)  Left wrist pain  Hyperglycemia    Rx / DC Orders ED Discharge Orders     None         Rexford Maus, DO 05/06/23 1516

## 2023-05-06 NOTE — Discharge Instructions (Addendum)
You were seen in the emergency department for your wrist pain and for needing dialysis.  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  You can continue to take Tylenol every 6 hours as needed for pain and ice your wrist and keep it elevated to help with the swelling.  Your blood sugar was mildly high today and you should make sure that you are taking your medications as prescribed.  You can continue to have your dialysis every Tuesday Thursday Saturday as scheduled.  You should return to the emergency department sooner if you are having any new or concerning symptoms.

## 2023-05-06 NOTE — Progress Notes (Signed)
Received patient in bed.Awake,alert and oriented x 4.He has an oxygen at 3lpm/[place].He consented sign his treatment today.  Medicines given : Ferric gluconate 250 mg=200 cc.  Access used : Left HD Catheter that worked well.Dressing changed done today.  Duration of treatment : 3 hours.  Fluid removed 3.200 liters.  Hemo comment ; Tolerated his treatment very well.  Hand of to the patient's nurse.

## 2023-05-06 NOTE — ED Notes (Signed)
Patient Alert and oriented to baseline. Stable and ambulatory to baseline. Patient verbalized understanding of the discharge instructions.  Patient belongings were taken by the patient.   

## 2023-05-06 NOTE — Procedures (Signed)
I was present at this dialysis session, have reviewed the session and made  appropriate changes Vinson Moselle MD  [facility] 05/06/2023, 8:44 PM

## 2023-05-06 NOTE — ED Provider Notes (Signed)
3:39 PM Assumed care of patient from off-going team. For more details, please see note from same day.  In brief, this is a 62 y.o. male who has ESRD without o/p dialysis center, comes to ED for dialysis.   Plan/Dispo at time of sign-out & ED Course since sign-out: [ ]  here for dialysis, reeval after and likely DC  BP (!) 137/48   Pulse 80   Temp 98.1 F (36.7 C)   Resp 19   SpO2 100%    ED Course:   Clinical Course as of 05/06/23 1539  Sat May 06, 2023  0753 I spoke with Dr. Arlean Hopping of nephrology who will add him to the HD schedule today. [VK]  [id number] Degenerative changes on XR without acute traumatic injury [VK]  0945 Labs with mild hyperglycemia and AGAP likely related to uremia. Pending HD at this time. [VK]  1515 Patient signed out to Dr. Jearld Fenton pending reassessment after HD. [VK]  1538 Patient reevaluated after dialysis. He states he feels at his baseline and would like to go home. He is on his home oxygen and is stable. Patient will be DC'd w/ DC instructions/return precautions. All questions answered to patient's satisfaction. [HN]    Clinical Course User Index [HN] Loetta Rough, MD [VK] Rexford Maus, DO    Dispo: DC ------------------------------- Vivi Barrack, MD Emergency Medicine  This note was created using dictation software, which may contain spelling or grammatical errors.   Loetta Rough, MD 05/06/23 1539

## 2023-05-06 NOTE — ED Notes (Signed)
Pt returned from dialysis. No complaints at this time.

## 2023-05-06 NOTE — ED Notes (Signed)
Checked patient cbg ity was 33 notified RN Crystal of blood sugar

## 2023-05-06 NOTE — Progress Notes (Addendum)
Mr. Fjeld presents to the ED for his routine hemodialysis. Patient discharged from his outpatient hemodialysis center d/t behavioral issues. Last HD on 6/6. Current Hgb is 7.5. He will receive his 4th dose Ferric Gluconate today per pharmacy's recommendations. Last Aranesp was given on 05/04/23. Plan for HD today. He will then return back to the ED after treatment for discharge.  Last OP HD orders (dec 2023):  3:15h  129kg  RUE AVG   Heparin 2000    Last hep B labs here were from 04/07/23  Salome Holmes, NP [facility]

## 2023-05-06 NOTE — ED Triage Notes (Signed)
Pt here for dialysis; pt also endorsing L wrist pain from mechanical fall this past Wednesday; PMS intact; taking tylenol at home without relief

## 2023-05-09 ENCOUNTER — Encounter (HOSPITAL_COMMUNITY): Payer: Self-pay

## 2023-05-09 ENCOUNTER — Other Ambulatory Visit: Payer: Self-pay

## 2023-05-09 ENCOUNTER — Emergency Department (HOSPITAL_COMMUNITY)
Admission: EM | Admit: 2023-05-09 | Discharge: 2023-05-09 | Disposition: A | Discharge status: Home / Self Care | Payer: 59 | Attending: Emergency Medicine | Admitting: Emergency Medicine

## 2023-05-09 DIAGNOSIS — Z79899 Other long term (current) drug therapy: Secondary | ICD-10-CM | POA: Insufficient documentation

## 2023-05-09 DIAGNOSIS — Z992 Dependence on renal dialysis: Secondary | ICD-10-CM | POA: Diagnosis not present

## 2023-05-09 DIAGNOSIS — I120 Hypertensive chronic kidney disease with stage 5 chronic kidney disease or end stage renal disease: Secondary | ICD-10-CM | POA: Insufficient documentation

## 2023-05-09 DIAGNOSIS — Z7902 Long term (current) use of antithrombotics/antiplatelets: Secondary | ICD-10-CM | POA: Insufficient documentation

## 2023-05-09 DIAGNOSIS — E1122 Type 2 diabetes mellitus with diabetic chronic kidney disease: Secondary | ICD-10-CM | POA: Insufficient documentation

## 2023-05-09 DIAGNOSIS — N186 End stage renal disease: Secondary | ICD-10-CM | POA: Insufficient documentation

## 2023-05-09 DIAGNOSIS — Z4931 Encounter for adequacy testing for hemodialysis: Secondary | ICD-10-CM | POA: Diagnosis present

## 2023-05-09 DIAGNOSIS — Z794 Long term (current) use of insulin: Secondary | ICD-10-CM | POA: Insufficient documentation

## 2023-05-09 DIAGNOSIS — Z7982 Long term (current) use of aspirin: Secondary | ICD-10-CM | POA: Insufficient documentation

## 2023-05-09 LAB — CBC WITH DIFFERENTIAL/PLATELET
Abs Immature Granulocytes: 0.22 10*3/uL — ABNORMAL HIGH (ref 0.00–0.07)
Basophils Absolute: 0 10*3/uL (ref 0.0–0.1)
Basophils Relative: 0 %
Eosinophils Absolute: 0.2 10*3/uL (ref 0.0–0.5)
Eosinophils Relative: 3 %
HCT: 25.7 % — ABNORMAL LOW (ref 39.0–52.0)
Hemoglobin: 7.8 g/dL — ABNORMAL LOW (ref 13.0–17.0)
Immature Granulocytes: 3 %
Lymphocytes Relative: 8 %
Lymphs Abs: 0.6 10*3/uL — ABNORMAL LOW (ref 0.7–4.0)
MCH: 27.7 pg (ref 26.0–34.0)
MCHC: 30.4 g/dL (ref 30.0–36.0)
MCV: 91.1 fL (ref 80.0–100.0)
Monocytes Absolute: 0.5 10*3/uL (ref 0.1–1.0)
Monocytes Relative: 7 %
Neutro Abs: 5.3 10*3/uL (ref 1.7–7.7)
Neutrophils Relative %: 79 %
Platelets: 129 10*3/uL — ABNORMAL LOW (ref 150–400)
RBC: 2.82 MIL/uL — ABNORMAL LOW (ref 4.22–5.81)
RDW: 15.6 % — ABNORMAL HIGH (ref 11.5–15.5)
WBC: 6.8 10*3/uL (ref 4.0–10.5)
nRBC: 0.4 % — ABNORMAL HIGH (ref 0.0–0.2)

## 2023-05-09 LAB — RENAL FUNCTION PANEL
Albumin: 3.2 g/dL — ABNORMAL LOW (ref 3.5–5.0)
Anion gap: 12 (ref 5–15)
BUN: 78 mg/dL — ABNORMAL HIGH (ref 8–23)
CO2: 27 mmol/L (ref 22–32)
Calcium: 7.7 mg/dL — ABNORMAL LOW (ref 8.9–10.3)
Chloride: 96 mmol/L — ABNORMAL LOW (ref 98–111)
Creatinine, Ser: 4.91 mg/dL — ABNORMAL HIGH (ref 0.61–1.24)
GFR, Estimated: 13 mL/min — ABNORMAL LOW (ref >60)
Glucose, Bld: 344 mg/dL — ABNORMAL HIGH (ref 70–99)
Phosphorus: 3.3 mg/dL (ref 2.5–4.6)
Potassium: 3.7 mmol/L (ref 3.5–5.1)
Sodium: 135 mmol/L (ref 135–145)

## 2023-05-09 MED ORDER — ALTEPLASE 2 MG IJ SOLR: 2.0000 mg | Freq: Once | INTRAMUSCULAR | Status: DC | PRN

## 2023-05-09 MED ORDER — ANTICOAGULANT SODIUM CITRATE 4% (200MG/5ML) IV SOLN: 5.0000 mL | Status: DC | PRN

## 2023-05-09 MED ORDER — HEPARIN SODIUM (PORCINE) 1000 UNIT/ML DIALYSIS
1000.0000 [IU] | INTRAMUSCULAR | Status: DC | PRN
  Administered 2023-05-09: 1000 [IU]
  Filled 2023-05-09 (×2): qty 1

## 2023-05-09 MED ORDER — CHLORHEXIDINE GLUCONATE CLOTH 2 % EX PADS: 6.0000 | MEDICATED_PAD | Freq: Every day | CUTANEOUS | Status: DC

## 2023-05-09 MED ORDER — HEPARIN SODIUM (PORCINE) 1000 UNIT/ML DIALYSIS
2000.0000 [IU] | Freq: Once | INTRAMUSCULAR | Status: AC
  Administered 2023-05-09: 2000 [IU] via INTRAVENOUS_CENTRAL
  Filled 2023-05-09: qty 2

## 2023-05-09 NOTE — ED Notes (Signed)
Pt to dialysis.

## 2023-05-09 NOTE — ED Triage Notes (Signed)
Pt returns for dialysis today. No other needs expressed, no pain anywhere.

## 2023-05-09 NOTE — ED Provider Notes (Signed)
[place] EMERGENCY DEPARTMENT AT [facility] Provider Note   CSN: [id number] Arrival date & time: 05/09/23  0800     History  Chief Complaint  Patient presents with   Needs Dialysis    Jeffrey [name] is a 62 y.o. male with a PMHx of ESRD (Dialysis TThSa), type 2 DM, HTN,  who presents to the ED for dialysis treatment. Last treatment was 05/06/23. Denies chest pain, shortness of breath, abdominal pain, nausea, vomiting.   The history is provided by the patient. No language interpreter was used.       Home Medications Prior to Admission medications   Medication Sig Start Date End Date Taking? Authorizing Provider  amLODipine (NORVASC) 10 MG tablet Take 10 mg by mouth every morning. 04/17/20   [provider]  ASPIRIN LOW DOSE 81 MG EC tablet Take 81 mg by mouth in the morning. 02/22/21   [provider]  atorvastatin (LIPITOR) 40 MG tablet Take 40 mg by mouth in the morning. 02/09/21   [provider]  calcium acetate (PHOSLO) 667 MG capsule Take 2 capsules (1,334 mg total) by mouth with breakfast, with lunch, and with evening meal. 04/10/21   Calvert Cantor, MD  Calcium Carbonate Antacid (CALCIUM CARBONATE, DOSED IN MG ELEMENTAL CALCIUM,) 1250 MG/5ML SUSP Take 5 mLs (500 mg of elemental calcium total) by mouth every 6 (six) hours as needed for indigestion. 04/08/23   Rhetta Mura, MD  carvedilol (COREG) 12.5 MG tablet Take 12.5 mg by mouth 2 (two) times daily with a meal. 04/17/20   [provider]  Cholecalciferol (VITAMIN D) 50 MCG (2000 UT) CAPS Take 2,000 Units by mouth daily.    [provider]  clopidogrel (PLAVIX) 75 MG tablet Take 1 tablet (75 mg total) by mouth daily. 11/02/22   Rhyne, Ames Coupe, PA-C  cyclobenzaprine (FLEXERIL) 10 MG tablet Take 10 mg by mouth 3 (three) times daily as needed for muscle spasms. 12/29/21   [provider]  diazepam (VALIUM) 5 MG tablet Take 5 mg by mouth 2 (two) times daily as  needed for muscle spasms. 12/09/21   [provider]  insulin regular human CONCENTRATED (HUMULIN R U-500 KWIKPEN) 500 UNIT/ML KwikPen Inject 45 Units into the skin daily with supper. Patient taking differently: Inject 70-125 Units into the skin 2 (two) times daily with a meal. 09/04/21 08/30/24  [name], MD  metolazone (ZAROXOLYN) 10 MG tablet Take 10 mg by mouth daily. 07/13/21   [provider]  nortriptyline (PAMELOR) 10 MG capsule Take 20 mg by mouth at bedtime. 10/23/19 08/30/24  [provider]  oxyCODONE (ROXICODONE) 5 MG immediate release tablet Take 1 tablet (5 mg total) by mouth every 8 (eight) hours as needed. Patient taking differently: Take 5 mg by mouth every 8 (eight) hours as needed for moderate pain. 12/31/22 12/31/23  [name], [name], DO  pantoprazole (PROTONIX) 40 MG tablet Take 40 mg by mouth daily before breakfast. 02/09/21   [provider]  pregabalin (LYRICA) 75 MG capsule Take 1 capsule (75 mg total) by mouth See admin instructions. Daily.  Give after dialysis on dialysis days. 01/21/23   [name], MD  sertraline (ZOLOFT) 100 MG tablet Take 100 mg by mouth in the morning. 02/22/21   [provider]  tamsulosin (FLOMAX) 0.4 MG CAPS capsule Take 1 capsule (0.4 mg total) by mouth daily. 11/10/21   [name], MD  torsemide (DEMADEX) 20 MG tablet Take 120 mg by mouth 2 (  two) times daily.    [provider]  traZODone (DESYREL) 150 MG tablet Take 150 mg by mouth at bedtime. 12/09/21   [provider]      Allergies    Actos [pioglitazone], Dexmedetomidine, Ibuprofen, Tomato, and Wellbutrin [bupropion]    Review of Systems   Review of Systems  All other systems reviewed and are negative.   Physical Exam Updated Vital Signs BP (!) 129/58 (BP Location: Right Arm)   Pulse 90   Temp 97.7 F (36.5 C) (Oral)   Resp 20   Ht 5\' 5"  (1.651 m)   Wt 48.5 kg   SpO2 100%   BMI 17.81 kg/m  Physical Exam Vitals  and nursing note reviewed.  Constitutional:      General: He is not in acute distress.    Appearance: Normal appearance.  Eyes:     General: No scleral icterus.    Extraocular Movements: Extraocular movements intact.  Cardiovascular:     Rate and Rhythm: Normal rate and regular rhythm.     Pulses: Normal pulses.     Heart sounds: Normal heart sounds.  Pulmonary:     Effort: Pulmonary effort is normal. No respiratory distress.     Breath sounds: Normal breath sounds.  Chest:     Comments: Catheter in place to left upper chest wall without TTP, erythema, or drainage noted.  Abdominal:     Palpations: Abdomen is soft. There is no mass.     Tenderness: There is no abdominal tenderness.  Musculoskeletal:        General: Normal range of motion.     Cervical back: Neck supple.  Skin:    General: Skin is warm and dry.     Findings: No rash.  Neurological:     Mental Status: He is alert.     Sensory: Sensation is intact.     Motor: Motor function is intact.  Psychiatric:        Behavior: Behavior normal.     ED Results / Procedures / Treatments   Labs (all labs ordered are listed, but only abnormal results are displayed) Labs Reviewed - No data to display  EKG None  Radiology No results found.  Procedures Procedures    Medications Ordered in ED Medications - No data to display  ED Course/ Medical Decision Making/ A&P                             Medical Decision Making Amount and/or Complexity of Data Reviewed Labs: ordered.   Pt presents with concerns for need for dialysis. Goes to dialysis TThSa, last treatment was 05/06/23. Vital signs, pt afebrile. On exam, pt with no acute cardiovascular, respiratory, abdominal exam findings.    Co morbidities that complicate the patient evaluation: ESRD (Dialysis TThSa), type 2 DM, HTN,   Disposition: Pt to dialysis from the ED. Labs to be obtained in dialysis. Labs and dialysis orders placed by Nephrologist.    This  chart was dictated using voice recognition software, Dragon. Despite the best efforts of this provider to proofread and correct errors, errors may still occur which can change documentation meaning.   Final Clinical Impression(s) / ED Diagnoses Final diagnoses:  Encounter for hemodialysis [facility])    Rx / DC Orders ED Discharge Orders     None         Kaitlen Redford A, PA-C 05/09/23 1215    Tegeler, Canary Brim, MD 05/09/23 1351

## 2023-05-09 NOTE — ED Provider Notes (Signed)
  Physical Exam  BP (!) 123/49 (BP Location: Right Wrist)   Pulse 91   Temp 98.2 F (36.8 C) (Oral)   Resp 16   Ht 5\' 5"  (1.651 m)   Wt 48.5 kg   SpO2 95%   BMI 17.81 kg/m     Procedures  Procedures  ED Course / MDM    Medical Decision Making Risk Decision regarding hospitalization.   Patient reassessed following dialysis upon return.  He is in no respiratory distress, lungs clear to auscultation, asymptomatic.  Overall stable for discharge.       Ernie Avena, MD 05/09/23 1408

## 2023-05-09 NOTE — Discharge Instructions (Addendum)
Return to the ER for dialysis until you are able to find an outpatient provider for continued hemodialysis

## 2023-05-09 NOTE — Procedures (Signed)
We are asked to see this patient for hospital dialysis. Pt was discharged from his OP HD unit in December 2023 due to behavioral issues. Pt came to ED today requesting hospital dialysis. Plan is for HD upstairs then will return to ED for reassessment and expected discharge home.      Last OP HD orders (dec 2023):  3:15h  129kg  RUE AVG   Heparin 2000    - last hep B labs here were from 04/07/23   Anemia of esrd:  Tsat was low on 5/14 at 11% --> pharm suggested ferric gluconate 250mg  q HD x 4-8. We decided on 1 gm load over 4 HD sessions, and he received the 4th dose on 6/08.    Pt had darbe in late April/ early May, but Hb continued to drop, so dosing of darbepoeitin has been increased up to and then to 150 mcg.   We all try to continue darbepoeitin 100- 150 mcg weekly or at least biweekly.    The darbepoeitin needs to ordered for this patient as "once in dialysis" and not "SQ at 1800" because he needs to be ready to leave at 5 pm to catch his ride home.       Date             Hb         Tsat/ ferritin    Darbe SQ     Nulecit 12/02/22            11.0       22%/ 935 01/24/23           9.0        31%/ 1056  02/16/23            8.8                                                         03/14/23            8.1                                                                        03/16/23            8.5                             60 mcg 03/30/23            8.0                             60 mcg  04/08/23            8.0  04/11/23            8.1        11%/ 545      100 mcg 5/18  250mg  #1 5/25                 7.5 04/24/23             8.0                             60 mcg  04/27/23             7.3                                                      250mg  #2 04/29/23                                                100 mcg          250mg  #3       6/06                 7.5                                150 mcg 6/08                  7.9                                                        250mg  #4 --> IV 1 gm load completed                I was present at this dialysis session, have reviewed the session and made  appropriate changes Vinson Moselle MD  [facility] 05/09/2023, 11:15 AM

## 2023-05-09 NOTE — ED Notes (Signed)
Patient did not want to wait on vitals signs.  Report he tolerated his dialysis except for  7 mins which he developed leg cramps which received 200cc back of fluids.  Patient got in wheelchair after MD saw him and left.

## 2023-05-11 ENCOUNTER — Encounter (HOSPITAL_COMMUNITY): Payer: Self-pay

## 2023-05-11 ENCOUNTER — Other Ambulatory Visit: Payer: Self-pay

## 2023-05-11 ENCOUNTER — Emergency Department (HOSPITAL_COMMUNITY)
Admission: EM | Admit: 2023-05-11 | Discharge: 2023-05-11 | Disposition: A | Discharge status: Home / Self Care | Payer: 59 | Attending: Emergency Medicine | Admitting: Emergency Medicine

## 2023-05-11 DIAGNOSIS — N186 End stage renal disease: Secondary | ICD-10-CM | POA: Insufficient documentation

## 2023-05-11 DIAGNOSIS — Z4931 Encounter for adequacy testing for hemodialysis: Secondary | ICD-10-CM | POA: Insufficient documentation

## 2023-05-11 DIAGNOSIS — Z992 Dependence on renal dialysis: Secondary | ICD-10-CM | POA: Diagnosis not present

## 2023-05-11 DIAGNOSIS — Z7982 Long term (current) use of aspirin: Secondary | ICD-10-CM | POA: Insufficient documentation

## 2023-05-11 DIAGNOSIS — Z794 Long term (current) use of insulin: Secondary | ICD-10-CM | POA: Insufficient documentation

## 2023-05-11 LAB — CBC
HCT: 27.3 % — ABNORMAL LOW (ref 39.0–52.0)
Hemoglobin: 8.1 g/dL — ABNORMAL LOW (ref 13.0–17.0)
MCH: 27.2 pg (ref 26.0–34.0)
MCHC: 29.7 g/dL — ABNORMAL LOW (ref 30.0–36.0)
MCV: 91.6 fL (ref 80.0–100.0)
Platelets: 114 10*3/uL — ABNORMAL LOW (ref 150–400)
RBC: 2.98 MIL/uL — ABNORMAL LOW (ref 4.22–5.81)
RDW: 15.9 % — ABNORMAL HIGH (ref 11.5–15.5)
WBC: 6.4 10*3/uL (ref 4.0–10.5)
nRBC: 0 % (ref 0.0–0.2)

## 2023-05-11 LAB — RENAL FUNCTION PANEL
Albumin: 3.3 g/dL — ABNORMAL LOW (ref 3.5–5.0)
Anion gap: 12 (ref 5–15)
BUN: 67 mg/dL — ABNORMAL HIGH (ref 8–23)
CO2: 26 mmol/L (ref 22–32)
Calcium: 7.6 mg/dL — ABNORMAL LOW (ref 8.9–10.3)
Chloride: 97 mmol/L — ABNORMAL LOW (ref 98–111)
Creatinine, Ser: 4.71 mg/dL — ABNORMAL HIGH (ref 0.61–1.24)
GFR, Estimated: 13 mL/min — ABNORMAL LOW (ref >60)
Glucose, Bld: 262 mg/dL — ABNORMAL HIGH (ref 70–99)
Phosphorus: 4.8 mg/dL — ABNORMAL HIGH (ref 2.5–4.6)
Potassium: 4 mmol/L (ref 3.5–5.1)
Sodium: 135 mmol/L (ref 135–145)

## 2023-05-11 MED ORDER — HEPARIN SODIUM (PORCINE) 1000 UNIT/ML IJ SOLN
INTRAMUSCULAR | Status: AC
  Filled 2023-05-11: qty 2

## 2023-05-11 MED ORDER — HEPARIN SODIUM (PORCINE) 1000 UNIT/ML DIALYSIS
2000.0000 [IU] | Freq: Once | INTRAMUSCULAR | Status: AC
  Administered 2023-05-11: 2000 [IU] via INTRAVENOUS_CENTRAL

## 2023-05-11 MED ORDER — CHLORHEXIDINE GLUCONATE CLOTH 2 % EX PADS: 6.0000 | MEDICATED_PAD | Freq: Every day | CUTANEOUS | Status: DC

## 2023-05-11 MED ORDER — HEPARIN SODIUM (PORCINE) 1000 UNIT/ML IJ SOLN
INTRAMUSCULAR | Status: AC
  Administered 2023-05-11: 4200 [IU]
  Filled 2023-05-11: qty 5

## 2023-05-11 NOTE — Discharge Instructions (Signed)
Return in two days for dialysis

## 2023-05-11 NOTE — ED Triage Notes (Signed)
Pt here to get dialysis. Pt denies any complaints 

## 2023-05-11 NOTE — ED Provider Notes (Signed)
[place] EMERGENCY DEPARTMENT AT [facility] Provider Note   CSN: [id number] Arrival date & time: 05/11/23  1914     History  Chief Complaint  Patient presents with   needs dialysis    Jeffrey [name] is a 62 y.o. male.  62 yo M with a cc of needing dialysis.  Patient has been released from his dialysis facility.  Gets dialysis Tuesday Thursday and Saturday.  He denies any significant change.  Denies any difficulty breathing.  Denies any significant edema.  Had dialysis in a full session on Tuesday.        Home Medications Prior to Admission medications   Medication Sig Start Date End Date Taking? Authorizing Provider  amLODipine (NORVASC) 10 MG tablet Take 10 mg by mouth every morning. 04/17/20   [provider]  ASPIRIN LOW DOSE 81 MG EC tablet Take 81 mg by mouth in the morning. 02/22/21   [provider]  atorvastatin (LIPITOR) 40 MG tablet Take 40 mg by mouth in the morning. 02/09/21   [provider]  calcium acetate (PHOSLO) 667 MG capsule Take 2 capsules (1,334 mg total) by mouth with breakfast, with lunch, and with evening meal. 04/10/21   Calvert Cantor, MD  Calcium Carbonate Antacid (CALCIUM CARBONATE, DOSED IN MG ELEMENTAL CALCIUM,) 1250 MG/5ML SUSP Take 5 mLs (500 mg of elemental calcium total) by mouth every 6 (six) hours as needed for indigestion. 04/08/23   Rhetta Mura, MD  carvedilol (COREG) 12.5 MG tablet Take 12.5 mg by mouth 2 (two) times daily with a meal. 04/17/20   [provider]  Cholecalciferol (VITAMIN D) 50 MCG (2000 UT) CAPS Take 2,000 Units by mouth daily.    [provider]  clopidogrel (PLAVIX) 75 MG tablet Take 1 tablet (75 mg total) by mouth daily. 11/02/22   Rhyne, Ames Coupe, PA-C  cyclobenzaprine (FLEXERIL) 10 MG tablet Take 10 mg by mouth 3 (three) times daily as needed for muscle spasms. 12/29/21   [provider]  diazepam (VALIUM) 5 MG tablet Take 5 mg by mouth 2 (two) times  daily as needed for muscle spasms. 12/09/21   [provider]  insulin regular human CONCENTRATED (HUMULIN R U-500 KWIKPEN) 500 UNIT/ML KwikPen Inject 45 Units into the skin daily with supper. Patient taking differently: Inject 70-125 Units into the skin 2 (two) times daily with a meal. 09/04/21 08/30/24  [name], MD  metolazone (ZAROXOLYN) 10 MG tablet Take 10 mg by mouth daily. 07/13/21   [provider]  nortriptyline (PAMELOR) 10 MG capsule Take 20 mg by mouth at bedtime. 10/23/19 08/30/24  [provider]  oxyCODONE (ROXICODONE) 5 MG immediate release tablet Take 1 tablet (5 mg total) by mouth every 8 (eight) hours as needed. Patient taking differently: Take 5 mg by mouth every 8 (eight) hours as needed for moderate pain. 12/31/22 12/31/23  [name], [name], DO  pantoprazole (PROTONIX) 40 MG tablet Take 40 mg by mouth daily before breakfast. 02/09/21   [provider]  pregabalin (LYRICA) 75 MG capsule Take 1 capsule (75 mg total) by mouth See admin instructions. Daily.  Give after dialysis on dialysis days. 01/21/23   [name], MD  sertraline (ZOLOFT) 100 MG tablet Take 100 mg by mouth in the morning. 02/22/21   [provider]  tamsulosin (FLOMAX) 0.4 MG CAPS capsule Take 1 capsule (0.4 mg total) by mouth daily. 11/10/21   [name], MD  torsemide (DEMADEX) 20 MG tablet Take 120  mg by mouth 2 (two) times daily.    [provider]  traZODone (DESYREL) 150 MG tablet Take 150 mg by mouth at bedtime. 12/09/21   [provider]      Allergies    Actos [pioglitazone], Dexmedetomidine, Ibuprofen, Tomato, and Wellbutrin [bupropion]    Review of Systems   Review of Systems  Physical Exam Updated Vital Signs BP (!) 140/57   Pulse 76   Temp (!) 97.1 F (36.2 C)   Resp 14   Ht 5\' 5"  (1.651 m)   Wt 48.5 kg   SpO2 100%   BMI 17.79 kg/m  Physical Exam Vitals and nursing note reviewed.  Constitutional:      Appearance: He  is well-developed.  HENT:     Head: Normocephalic and atraumatic.  Eyes:     Pupils: Pupils are equal, round, and reactive to light.  Neck:     Vascular: No JVD.  Cardiovascular:     Rate and Rhythm: Normal rate and regular rhythm.     Heart sounds: No murmur heard.    No friction rub. No gallop.  Pulmonary:     Effort: No respiratory distress.     Breath sounds: No wheezing.     Comments: Distant heart sounds due to body habitus.  No appreciable rales. Abdominal:     General: There is no distension.     Tenderness: There is no abdominal tenderness. There is no guarding or rebound.  Musculoskeletal:        General: Normal range of motion.     Cervical back: Normal range of motion and neck supple.     Comments: Edema to the bilateral lower extremities.  Bilateral amputations.  Skin:    Coloration: Skin is not pale.     Findings: No rash.  Neurological:     Mental Status: He is alert and oriented to person, place, and time.  Psychiatric:        Behavior: Behavior normal.     ED Results / Procedures / Treatments   Labs (all labs ordered are listed, but only abnormal results are displayed) Labs Reviewed  CBC - Abnormal; Notable for the following components:      Result Value   RBC 2.98 (*)    Hemoglobin 8.1 (*)    HCT 27.3 (*)    MCHC 29.7 (*)    RDW 15.9 (*)    Platelets 114 (*)    All other components within normal limits  RENAL FUNCTION PANEL - Abnormal; Notable for the following components:   Chloride 97 (*)    Glucose, Bld 262 (*)    BUN 67 (*)    Creatinine, Ser 4.71 (*)    Calcium 7.6 (*)    Phosphorus 4.8 (*)    Albumin 3.3 (*)    GFR, Estimated 13 (*)    All other components within normal limits  HEPATITIS B SURFACE ANTIGEN  HEPATITIS B SURFACE ANTIBODY, QUANTITATIVE    EKG None  Radiology No results found.  Procedures Procedures    Medications Ordered in ED Medications  Chlorhexidine Gluconate Cloth 2 % PADS 6 each (has no administration in  time range)  heparin sodium (porcine) 1000 UNIT/ML injection (has no administration in time range)  heparin injection 2,000 Units (2,000 Units Dialysis Given 05/11/23 0900)  heparin sodium (porcine) 1000 UNIT/ML injection (4,200 Units  Given 05/11/23 1150)    ED Course/ Medical Decision Making/ A&P  Medical Decision Making Amount and/or Complexity of Data Reviewed ECG/medicine tests: ordered.   62 yo M here for dialysis.  Patient unfortunately had been released by his dialysis center.  Gets dialysis through the ER.  Last dialyzed on Tuesday and had a full session.  He denies any significant change to his health.  Denies any significant edema denies any worsening difficulty breathing.  Will discuss with nephrology.  Case discussed with Dr. Arlean Hopping, plan to dialyze.   Patient has returned from dialysis and feels fine.  He would like to go home.  Has no complaints.  1:37 PM:  I have discussed the diagnosis/risks/treatment options with the patient.  Evaluation and diagnostic testing in the emergency department does not suggest an emergent condition requiring admission or immediate intervention beyond what has been performed at this time.  They will follow up with Nephrology. We also discussed returning to the ED immediately if new or worsening sx occur. We discussed the sx which are most concerning (e.g., sudden worsening pain, fever, inability to tolerate by mouth, sob, edema, syncope) that necessitate immediate return. Medications administered to the patient during their visit and any new prescriptions provided to the patient are listed below.  Medications given during this visit Medications  Chlorhexidine Gluconate Cloth 2 % PADS 6 each (has no administration in time range)  heparin sodium (porcine) 1000 UNIT/ML injection (has no administration in time range)  heparin injection 2,000 Units (2,000 Units Dialysis Given 05/11/23 0900)  heparin sodium (porcine) 1000  UNIT/ML injection (4,200 Units  Given 05/11/23 1150)     The patient appears reasonably screen and/or stabilized for discharge and I doubt any other medical condition or other [facility] requiring further screening, evaluation, or treatment in the ED at this time prior to discharge.          Final Clinical Impression(s) / ED Diagnoses Final diagnoses:  Encounter for dialysis [facility])    Rx / DC Orders ED Discharge Orders     None         Melene Plan, DO 05/11/23 1337

## 2023-05-11 NOTE — Procedures (Addendum)
We are asked to see this patient for hospital dialysis. Pt was discharged from his OP HD unit in December 2023 due to behavioral issues. Pt came to ED today requesting hospital dialysis. Plan is for HD upstairs then will return to ED for reassessment and expected discharge home.      Last OP HD orders (dec 2023):  3:15h  129kg  RUE AVG   Heparin 2000    - last hep B labs here were - neg Hep B SAg from 05/06/23         - + hep B S Ab's (32.4) from 04/07/23  - pt typically come on a TuThSat for hospital HD   Anemia of esrd:  Tsat was low on 5/14 at 11% --> pharm suggested ferric gluconate 1-2 gms given as 250mg  q HD. We gave him 1 gm load over 4 HD sessions as below.    Pt had darbe in late April/ early May, but Hb continued to drop, so dosing of darbepoeitin was increased up to and then to 150 mcg.   We all try to continue darbepoeitin 100- 150 mcg weekly or at least biweekly.    The darbepoeitin needs to ordered for this patient as "once in dialysis" and not "SQ at 1800" because he needs to be ready to leave at 5 pm to catch his ride home.       Date             Hb         Tsat/ ferritin    Darbe SQ     Nulecit 12/02/22            11.0       22%/ 935 01/24/23           9.0        31%/ 1056   03/16/23            8.5                             60 mcg 03/30/23            8.0                             60 mcg  04/11/23            8.1        11%/ 545      100 mcg 5/18                                                                          250mg  #1 04/24/23             8.0                             60 mcg  04/27/23             7.3  250mg  #2 04/29/23                                                100 mcg          250mg  #3       6/06                 7.5                                150 mcg 6/08                 7.9                                                        250mg  #4 --> IV 1 gm load completed   I was present at this  dialysis session, have reviewed the session and made  appropriate changes Vinson Moselle MD  [facility] 05/11/2023, 1:48 PM

## 2023-05-11 NOTE — ED Notes (Signed)
Patient refused VS. Discharged by EDP.

## 2023-05-11 NOTE — Progress Notes (Signed)
Received patient in bed .Awake,alert and oriented x 4.He signed his own  Hd treatment consent.  Medicines given: Heparin 2,000 units pre run bolus.  Access used : Right Hd catheter that worked well.Dressing on date.  Duration of treatment : 3.5 hours.  Fluid removed : Achieved prescribed UF goal of 3 liters.  Hemo comment:Tolerated treatment well.   Hand off to the patient's nurse.

## 2023-05-11 NOTE — Progress Notes (Deleted)
We are asked to see this patient for hospital dialysis. Pt was discharged from his OP HD unit in December 2023 due to behavioral issues. Pt came to ED today requesting hospital dialysis. Plan is for HD upstairs then will return to ED for reassessment and expected discharge home.      Last OP HD orders (dec 2023):  3:15h  129kg  RUE AVG   Heparin 2000    - last hep B labs here were - neg Hep B SAg from 05/06/23         - + hep B S Ab's (32.4) from 04/07/23  - pt typically come on a TuThSat for hospital HD   Anemia of esrd:  Tsat was low on 5/14 at 11% --> pharm suggested ferric gluconate 1-2 gms given as 250mg q HD. We gave him 1 gm load over 4 HD sessions as below.    Pt had darbe 60mcg in late April/ early May, but Hb continued to drop, so dosing of darbepoeitin was increased up to 100mcg and then to 150 mcg.   We all try to continue darbepoeitin 100- 150 mcg weekly or at least biweekly.    The darbepoeitin needs to ordered for this patient as "once in dialysis" and not "SQ at 1800" because he needs to be ready to leave at 5 pm to catch his ride home.       Date             Hb         Tsat/ ferritin    Darbe SQ     Nulecit 12/02/22            11.0       22%/ 935 01/24/23           9.0        31%/ 1056   03/16/23            8.5                             60 mcg 03/30/23            8.0                             60 mcg  04/11/23            8.1        11%/ 545      100 mcg 5/18                                                                          250mg #1 04/24/23             8.0                             60 mcg  04/27/23             7.3                                                        250mg #2 04/29/23                                                100 mcg          250mg #3       6/06                 7.5                                150 mcg 6/08                 7.9                                                        250mg #4 --> IV 1 gm load completed   I was present at this  dialysis session, have reviewed the session and made  appropriate changes Rob Miquan Tandon MD  [facility] 05/11/2023, 1:48 PM   

## 2023-05-13 ENCOUNTER — Encounter (HOSPITAL_COMMUNITY): Payer: Self-pay | Admitting: Emergency Medicine

## 2023-05-13 ENCOUNTER — Emergency Department (HOSPITAL_COMMUNITY)
Admission: EM | Admit: 2023-05-13 | Discharge: 2023-05-13 | Disposition: A | Discharge status: Home / Self Care | Payer: 59 | Attending: Emergency Medicine | Admitting: Emergency Medicine

## 2023-05-13 DIAGNOSIS — Z4931 Encounter for adequacy testing for hemodialysis: Secondary | ICD-10-CM | POA: Insufficient documentation

## 2023-05-13 DIAGNOSIS — Z89612 Acquired absence of left leg above knee: Secondary | ICD-10-CM | POA: Diagnosis not present

## 2023-05-13 DIAGNOSIS — Z7902 Long term (current) use of antithrombotics/antiplatelets: Secondary | ICD-10-CM | POA: Insufficient documentation

## 2023-05-13 DIAGNOSIS — N186 End stage renal disease: Secondary | ICD-10-CM | POA: Diagnosis not present

## 2023-05-13 DIAGNOSIS — E1122 Type 2 diabetes mellitus with diabetic chronic kidney disease: Secondary | ICD-10-CM | POA: Insufficient documentation

## 2023-05-13 DIAGNOSIS — I509 Heart failure, unspecified: Secondary | ICD-10-CM | POA: Insufficient documentation

## 2023-05-13 DIAGNOSIS — Z794 Long term (current) use of insulin: Secondary | ICD-10-CM | POA: Diagnosis not present

## 2023-05-13 DIAGNOSIS — Z79899 Other long term (current) drug therapy: Secondary | ICD-10-CM | POA: Diagnosis not present

## 2023-05-13 DIAGNOSIS — Z7982 Long term (current) use of aspirin: Secondary | ICD-10-CM | POA: Diagnosis not present

## 2023-05-13 DIAGNOSIS — J449 Chronic obstructive pulmonary disease, unspecified: Secondary | ICD-10-CM | POA: Insufficient documentation

## 2023-05-13 DIAGNOSIS — I132 Hypertensive heart and chronic kidney disease with heart failure and with stage 5 chronic kidney disease, or end stage renal disease: Secondary | ICD-10-CM | POA: Insufficient documentation

## 2023-05-13 DIAGNOSIS — Z992 Dependence on renal dialysis: Secondary | ICD-10-CM | POA: Insufficient documentation

## 2023-05-13 LAB — RENAL FUNCTION PANEL
Albumin: 3.5 g/dL (ref 3.5–5.0)
Anion gap: 12 (ref 5–15)
BUN: 58 mg/dL — ABNORMAL HIGH (ref 8–23)
CO2: 27 mmol/L (ref 22–32)
Calcium: 8.2 mg/dL — ABNORMAL LOW (ref 8.9–10.3)
Chloride: 97 mmol/L — ABNORMAL LOW (ref 98–111)
Creatinine, Ser: 4.57 mg/dL — ABNORMAL HIGH (ref 0.61–1.24)
GFR, Estimated: 14 mL/min — ABNORMAL LOW (ref >60)
Glucose, Bld: 91 mg/dL (ref 70–99)
Phosphorus: 3.8 mg/dL (ref 2.5–4.6)
Potassium: 3.8 mmol/L (ref 3.5–5.1)
Sodium: 136 mmol/L (ref 135–145)

## 2023-05-13 LAB — CBC
HCT: 28.7 % — ABNORMAL LOW (ref 39.0–52.0)
Hemoglobin: 8.9 g/dL — ABNORMAL LOW (ref 13.0–17.0)
MCH: 27.9 pg (ref 26.0–34.0)
MCHC: 31 g/dL (ref 30.0–36.0)
MCV: 90 fL (ref 80.0–100.0)
Platelets: 112 10*3/uL — ABNORMAL LOW (ref 150–400)
RBC: 3.19 MIL/uL — ABNORMAL LOW (ref 4.22–5.81)
RDW: 15.9 % — ABNORMAL HIGH (ref 11.5–15.5)
WBC: 6.6 10*3/uL (ref 4.0–10.5)
nRBC: 0 % (ref 0.0–0.2)

## 2023-05-13 LAB — CBG MONITORING, ED: Glucose-Capillary: 104 mg/dL — ABNORMAL HIGH (ref 70–99)

## 2023-05-13 MED ORDER — HEPARIN SODIUM (PORCINE) 1000 UNIT/ML DIALYSIS
2000.0000 [IU] | Freq: Once | INTRAMUSCULAR | Status: AC
  Administered 2023-05-13: 2000 [IU] via INTRAVENOUS_CENTRAL

## 2023-05-13 MED ORDER — CHLORHEXIDINE GLUCONATE CLOTH 2 % EX PADS: 6.0000 | MEDICATED_PAD | Freq: Every day | CUTANEOUS | Status: DC

## 2023-05-13 MED ORDER — HEPARIN SODIUM (PORCINE) 1000 UNIT/ML IJ SOLN
INTRAMUSCULAR | Status: AC
  Administered 2023-05-13: 4200 [IU]
  Filled 2023-05-13: qty 3

## 2023-05-13 MED ORDER — DARBEPOETIN ALFA 100 MCG/0.5ML IJ SOSY
100.0000 ug | PREFILLED_SYRINGE | Freq: Once | INTRAMUSCULAR | Status: DC
  Filled 2023-05-13: qty 0.5

## 2023-05-13 NOTE — ED Provider Notes (Signed)
[place] EMERGENCY DEPARTMENT AT [facility] Provider Note   CSN: [id number] Arrival date & time: 05/13/23  0747     History  Chief Complaint  Patient presents with   Hemodialysis    Jeffrey [name] is a 62 y.o. male.  62 y/o male with hx of DM, HTN, COPD, CHF, ESRD (T/Th/S dialysis) presents for dialysis today. Last dialyzed in the hospital 2 days ago. Has been released by his dialysis center, causing his repeat presentations to the ED for dialysis. Patient has no acute complaints. States he feels in his usual state of health.  The history is provided by the patient. No language interpreter was used.       Home Medications Prior to Admission medications   Medication Sig Start Date End Date Taking? Authorizing Provider  amLODipine (NORVASC) 10 MG tablet Take 10 mg by mouth every morning. 04/17/20   [provider]  ASPIRIN LOW DOSE 81 MG EC tablet Take 81 mg by mouth in the morning. 02/22/21   [provider]  atorvastatin (LIPITOR) 40 MG tablet Take 40 mg by mouth in the morning. 02/09/21   [provider]  calcium acetate (PHOSLO) 667 MG capsule Take 2 capsules (1,334 mg total) by mouth with breakfast, with lunch, and with evening meal. 04/10/21   Calvert Cantor, MD  Calcium Carbonate Antacid (CALCIUM CARBONATE, DOSED IN MG ELEMENTAL CALCIUM,) 1250 MG/5ML SUSP Take 5 mLs (500 mg of elemental calcium total) by mouth every 6 (six) hours as needed for indigestion. 04/08/23   Rhetta Mura, MD  carvedilol (COREG) 12.5 MG tablet Take 12.5 mg by mouth 2 (two) times daily with a meal. 04/17/20   [provider]  Cholecalciferol (VITAMIN D) 50 MCG (2000 UT) CAPS Take 2,000 Units by mouth daily.    [provider]  clopidogrel (PLAVIX) 75 MG tablet Take 1 tablet (75 mg total) by mouth daily. 11/02/22   Rhyne, Ames Coupe, PA-C  cyclobenzaprine (FLEXERIL) 10 MG tablet Take 10 mg by mouth 3 (three) times daily as needed for muscle spasms.  12/29/21   [provider]  diazepam (VALIUM) 5 MG tablet Take 5 mg by mouth 2 (two) times daily as needed for muscle spasms. 12/09/21   [provider]  insulin regular human CONCENTRATED (HUMULIN R U-500 KWIKPEN) 500 UNIT/ML KwikPen Inject 45 Units into the skin daily with supper. Patient taking differently: Inject 70-125 Units into the skin 2 (two) times daily with a meal. 09/04/21 08/30/24  [name], MD  metolazone (ZAROXOLYN) 10 MG tablet Take 10 mg by mouth daily. 07/13/21   [provider]  nortriptyline (PAMELOR) 10 MG capsule Take 20 mg by mouth at bedtime. 10/23/19 08/30/24  [provider]  oxyCODONE (ROXICODONE) 5 MG immediate release tablet Take 1 tablet (5 mg total) by mouth every 8 (eight) hours as needed. Patient taking differently: Take 5 mg by mouth every 8 (eight) hours as needed for moderate pain. 12/31/22 12/31/23  [name], [name], DO  pantoprazole (PROTONIX) 40 MG tablet Take 40 mg by mouth daily before breakfast. 02/09/21   [provider]  pregabalin (LYRICA) 75 MG capsule Take 1 capsule (75 mg total) by mouth See admin instructions. Daily.  Give after dialysis on dialysis days. 01/21/23   [name], MD  sertraline (ZOLOFT) 100 MG tablet Take 100 mg by mouth in the morning. 02/22/21   [provider]  tamsulosin (FLOMAX) 0.4 MG CAPS capsule Take 1 capsule (0.4 mg total) by  mouth daily. 11/10/21   [name], MD  torsemide (DEMADEX) 20 MG tablet Take 120 mg by mouth 2 (two) times daily.    [provider]  traZODone (DESYREL) 150 MG tablet Take 150 mg by mouth at bedtime. 12/09/21   [provider]      Allergies    Actos [pioglitazone], Dexmedetomidine, Ibuprofen, Tomato, and Wellbutrin [bupropion]    Review of Systems   Review of Systems Ten systems reviewed and are negative for acute change, except as noted in the HPI.    Physical Exam Updated Vital Signs BP (!) 144/60   Pulse 86   Temp 98 F  (36.7 C) (Oral)   Resp 16   SpO2 100%   Physical Exam Vitals and nursing note reviewed.  Constitutional:      General: He is not in acute distress.    Appearance: He is well-developed. He is not diaphoretic.     Comments: Obese AA male, in NAD  HENT:     Head: Normocephalic and atraumatic.  Eyes:     General: No scleral icterus.    Conjunctiva/sclera: Conjunctivae normal.  Cardiovascular:     Rate and Rhythm: Normal rate and regular rhythm.     Pulses: Normal pulses.  Pulmonary:     Effort: Pulmonary effort is normal. No respiratory distress.     Breath sounds: No stridor. No wheezing or rhonchi.     Comments: Respirations even and unlabored. Musculoskeletal:        General: Normal range of motion.     Cervical back: Normal range of motion.     Comments: Left AKA  Skin:    General: Skin is warm and dry.     Coloration: Skin is not pale.     Findings: No erythema or rash.  Neurological:     Mental Status: He is alert and oriented to person, place, and time.     Coordination: Coordination normal.  Psychiatric:        Behavior: Behavior normal.     ED Results / Procedures / Treatments   Labs (all labs ordered are listed, but only abnormal results are displayed) Labs Reviewed  CBC - Abnormal; Notable for the following components:      Result Value   RBC 3.19 (*)    Hemoglobin 8.9 (*)    HCT 28.7 (*)    RDW 15.9 (*)    Platelets 112 (*)    All other components within normal limits  RENAL FUNCTION PANEL - Abnormal; Notable for the following components:   Chloride 97 (*)    BUN 58 (*)    Creatinine, Ser 4.57 (*)    Calcium 8.2 (*)    GFR, Estimated 14 (*)    All other components within normal limits    EKG None  Radiology No results found.  Procedures Procedures    Medications Ordered in ED Medications  Chlorhexidine Gluconate Cloth 2 % PADS 6 each (has no administration in time range)  Darbepoetin Alfa (ARANESP) injection 100 mcg (has no  administration in time range)    ED Course/ Medical Decision Making/ A&P Clinical Course as of 05/13/23 1018  Sat May 13, 2023  0820 Consult placed to nephrology to discuss coordinating dialysis for patient today. [KH]  1610 Spoke with Dr. Arlean Hopping who will coordinate dialysis for the patient today. [KH]    Clinical Course User Index [KH] Antony Madura, PA-C  Medical Decision Making Amount and/or Complexity of Data Reviewed Labs: ordered.  Risk Decision regarding hospitalization.   This patient presents to the ED for concern of ESRD, in need of dialysis, this involves an extensive number of treatment options, and is a complaint that carries with it a high risk of complications and morbidity.  The differential diagnosis includes electrolyte derangement, fluid overload, uremia.   Co morbidities that complicate the patient evaluation  Obesity DM CHF HTN   Additional history obtained:  External records from outside source obtained and reviewed including creatinine from 05/11/23 noted to be 4.71. Baseline ~5 based on chart review   Lab Tests:  I Ordered, and personally interpreted labs.  The pertinent results include:  Anemia of chronic disease stable at 8.9 (improved compared to prior). Creatinine 4.57.   Cardiac Monitoring:  The patient was maintained on a cardiac monitor.  I personally viewed and interpreted the cardiac monitored which showed an underlying rhythm of: NSR   Medicines ordered and prescription drug management:  I have reviewed the patients home medicines and have made adjustments as needed   Consultations Obtained:  I requested consultation with Dr. Arlean Hopping of nephrology and discussed lab and imaging findings as well as pertinent plan - they plan to coordinate inpatient dialysis for patient.   Problem List / ED Course:  As above   Reevaluation:  After the interventions noted above, I reevaluated the patient and found  that they have :stayed the same   Social Determinants of Health:  Use of assist device; prior AKA   Dispostion:  After consideration of the diagnostic results and the patients response to treatment, I feel that the patent would benefit from maintenance dialysis. Will be admitted for this by Nephrology. Anticipate discharge home following dialysis today.          Final Clinical Impression(s) / ED Diagnoses Final diagnoses:  Encounter for dialysis [facility])    Rx / DC Orders ED Discharge Orders     None         Antony Madura, PA-C 05/13/23 1022    Cathren Laine, MD 05/13/23 1520

## 2023-05-13 NOTE — ED Notes (Signed)
Pt returned from HD. Per HD RN, pt had 3L removed per order. No difficulties with treatment.

## 2023-05-13 NOTE — ED Notes (Signed)
Pt transported to HD.

## 2023-05-13 NOTE — ED Notes (Signed)
Pt discharged from the ED.

## 2023-05-13 NOTE — Progress Notes (Signed)
   05/13/23 1730  Vitals  Temp 97.7 F (36.5 C)  Pulse Rate 83  Resp 19  BP 116/71  SpO2 99 %  O2 Device Nasal Cannula  Type of Weight Post-Dialysis  Oxygen Therapy  O2 Flow Rate (L/min) 3 L/min  Patient Activity (if Appropriate) In bed  Pulse Oximetry Type Continuous  Oximetry Probe Site Changed No  Post Treatment  Dialyzer Clearance Lightly streaked  Duration of HD Treatment -hour(s) 3.14 hour(s)  Hemodialysis Intake (mL) 0 mL  Liters Processed 77.6  Fluid Removed (mL) 3000 mL  Tolerated HD Treatment Yes   Received patient in bed to unit.  Alert and oriented.  Informed consent signed and in chart.   TX duration:3.15  Patient tolerated well.  Transported back to the room  Alert, without acute distress.  Hand-off given to patient's nurse.   Access used: LDLC Access issues: no complications  Total UF removed: 3000 Medication(s) given: none   [facility]

## 2023-05-13 NOTE — ED Triage Notes (Signed)
Pt here for hemodialysis, denies pain/complaints. Has not missed any dialysis treatments.

## 2023-05-13 NOTE — Procedures (Signed)
We are asked to see this patient for hospital dialysis. Pt was discharged from his OP HD unit in December 2023 due to behavioral issues. Pt came to ED today requesting hospital dialysis. Plan is for HD upstairs then will return to ED for reassessment and expected discharge home.      Last OP HD orders (dec 2023):  3:15h  129kg  RUE AVG   Heparin 2000    - last hep B labs here were - neg Hep B SAg from 05/06/23         - + hep B S Ab's (32.4) from 04/07/23    - pt typically comes on Tu, Th and Sat for hospital HD   Anemia of esrd:  Tsat was low on 5/14 at 11% --> pharm suggested ferric gluconate 1-2 gms given as 250mg  q HD. We gave him 1 gm load over 4 HD sessions as below.    Pt had darbe in late April/ early May, but Hb continued to drop, so dosing of darbepoeitin was increased up to and then to 150 mcg.   Will try to continue darbepoeitin 100- 150 mcg weekly or at least biweekly.    The darbepoeitin needs to ordered for this patient as "once in dialysis" and not "SQ at 1800" because he needs to be ready to leave at 5 pm to catch his ride home.    HD access: Pt had new LUA AVG and LIJ TDC placed on 04/07/23 by Dr Edilia Bo. We should check w/ [facility] when he is here next week to see if AVG is ready to use.       Date             Hb         Tsat/ ferritin    Darbe SQ     Nulecit 12/02/22            11.0       22%/ 935 01/24/23           9.0        31%/ 1056   03/16/23            8.5                             60 mcg 03/30/23            8.0                             60 mcg  04/11/23            8.1        11%/ 545      100 mcg 5/18                                                                          250mg  #1 04/24/23             8.0                             60 mcg  04/27/23  7.3                                                      250mg  #2 04/29/23                                                100 mcg          250mg  #3       6/06                 7.5                                 150 mcg 6/08                 7.9                                                        250mg  #4 of 4 05/11/23            8.1                                 100 mcg ordered                         I was present at this dialysis session, have reviewed the session and made  appropriate changes Vinson Moselle MD  [facility] 05/13/2023, 2:28 PM

## 2023-05-16 ENCOUNTER — Emergency Department (HOSPITAL_COMMUNITY)
Admission: EM | Admit: 2023-05-16 | Discharge: 2023-05-16 | Disposition: A | Discharge status: Home / Self Care | Payer: 59 | Attending: Emergency Medicine | Admitting: Emergency Medicine

## 2023-05-16 ENCOUNTER — Encounter (HOSPITAL_COMMUNITY): Payer: Self-pay

## 2023-05-16 DIAGNOSIS — Z7982 Long term (current) use of aspirin: Secondary | ICD-10-CM | POA: Insufficient documentation

## 2023-05-16 DIAGNOSIS — Z992 Dependence on renal dialysis: Secondary | ICD-10-CM | POA: Diagnosis present

## 2023-05-16 DIAGNOSIS — E1122 Type 2 diabetes mellitus with diabetic chronic kidney disease: Secondary | ICD-10-CM | POA: Diagnosis not present

## 2023-05-16 DIAGNOSIS — Z794 Long term (current) use of insulin: Secondary | ICD-10-CM | POA: Diagnosis not present

## 2023-05-16 DIAGNOSIS — I132 Hypertensive heart and chronic kidney disease with heart failure and with stage 5 chronic kidney disease, or end stage renal disease: Secondary | ICD-10-CM | POA: Insufficient documentation

## 2023-05-16 DIAGNOSIS — I509 Heart failure, unspecified: Secondary | ICD-10-CM | POA: Insufficient documentation

## 2023-05-16 DIAGNOSIS — Z79899 Other long term (current) drug therapy: Secondary | ICD-10-CM | POA: Diagnosis not present

## 2023-05-16 DIAGNOSIS — Z7901 Long term (current) use of anticoagulants: Secondary | ICD-10-CM | POA: Diagnosis not present

## 2023-05-16 DIAGNOSIS — J449 Chronic obstructive pulmonary disease, unspecified: Secondary | ICD-10-CM | POA: Diagnosis not present

## 2023-05-16 DIAGNOSIS — N186 End stage renal disease: Secondary | ICD-10-CM | POA: Diagnosis not present

## 2023-05-16 LAB — RENAL FUNCTION PANEL
Albumin: 3.5 g/dL (ref 3.5–5.0)
Anion gap: 13 (ref 5–15)
BUN: 68 mg/dL — ABNORMAL HIGH (ref 8–23)
CO2: 25 mmol/L (ref 22–32)
Calcium: 8 mg/dL — ABNORMAL LOW (ref 8.9–10.3)
Chloride: 97 mmol/L — ABNORMAL LOW (ref 98–111)
Creatinine, Ser: 4.51 mg/dL — ABNORMAL HIGH (ref 0.61–1.24)
GFR, Estimated: 14 mL/min — ABNORMAL LOW (ref >60)
Glucose, Bld: 153 mg/dL — ABNORMAL HIGH (ref 70–99)
Phosphorus: 4.6 mg/dL (ref 2.5–4.6)
Potassium: 4 mmol/L (ref 3.5–5.1)
Sodium: 135 mmol/L (ref 135–145)

## 2023-05-16 LAB — CBC
HCT: 28.9 % — ABNORMAL LOW (ref 39.0–52.0)
Hemoglobin: 8.4 g/dL — ABNORMAL LOW (ref 13.0–17.0)
MCH: 27 pg (ref 26.0–34.0)
MCHC: 29.1 g/dL — ABNORMAL LOW (ref 30.0–36.0)
MCV: 92.9 fL (ref 80.0–100.0)
Platelets: 72 10*3/uL — ABNORMAL LOW (ref 150–400)
RBC: 3.11 MIL/uL — ABNORMAL LOW (ref 4.22–5.81)
RDW: 15.6 % — ABNORMAL HIGH (ref 11.5–15.5)
WBC: 5.3 10*3/uL (ref 4.0–10.5)
nRBC: 0 % (ref 0.0–0.2)

## 2023-05-16 MED ORDER — HEPARIN SODIUM (PORCINE) 1000 UNIT/ML DIALYSIS
2000.0000 [IU] | INTRAMUSCULAR | Status: DC | PRN
  Administered 2023-05-16: 2000 [IU] via INTRAVENOUS_CENTRAL
  Filled 2023-05-16 (×2): qty 2

## 2023-05-16 MED ORDER — CHLORHEXIDINE GLUCONATE CLOTH 2 % EX PADS: 6.0000 | MEDICATED_PAD | Freq: Every day | CUTANEOUS | Status: DC

## 2023-05-16 MED ORDER — LIDOCAINE-PRILOCAINE 2.5-2.5 % EX CREA: 1.0000 | TOPICAL_CREAM | CUTANEOUS | Status: DC | PRN

## 2023-05-16 MED ORDER — ALTEPLASE 2 MG IJ SOLR: 2.0000 mg | Freq: Once | INTRAMUSCULAR | Status: DC | PRN

## 2023-05-16 MED ORDER — HEPARIN SODIUM (PORCINE) 1000 UNIT/ML DIALYSIS: 1000.0000 [IU] | INTRAMUSCULAR | Status: DC | PRN

## 2023-05-16 MED ORDER — ANTICOAGULANT SODIUM CITRATE 4% (200MG/5ML) IV SOLN: 5.0000 mL | Status: DC | PRN

## 2023-05-16 MED ORDER — LIDOCAINE HCL (PF) 1 % IJ SOLN: 5.0000 mL | INTRAMUSCULAR | Status: DC | PRN

## 2023-05-16 MED ORDER — HEPARIN SODIUM (PORCINE) 1000 UNIT/ML IJ SOLN
4200.0000 [IU] | Freq: Once | INTRAMUSCULAR | Status: AC
  Administered 2023-05-16: 4200 [IU]
  Filled 2023-05-16: qty 5

## 2023-05-16 MED ORDER — PENTAFLUOROPROP-TETRAFLUOROETH EX AERO: 1.0000 | INHALATION_SPRAY | CUTANEOUS | Status: DC | PRN

## 2023-05-16 NOTE — ED Notes (Signed)
Patient transported to dialysis

## 2023-05-16 NOTE — ED Provider Notes (Signed)
[place] EMERGENCY DEPARTMENT AT [facility] Provider Note   CSN: [id number] Arrival date & time: 05/16/23  0715     History  Chief Complaint  Patient presents with   Needs Dialysis    Jeffrey [name] is a 62 y.o. male with history of DM, HTN, COPD, CHF, ESRD (on hemodialysis T/R/S) who presents for dialysis today. Last dialyzed in hospital three days ago. Has no acute complaints today.   HPI     Home Medications Prior to Admission medications   Medication Sig Start Date End Date Taking? Authorizing Provider  amLODipine (NORVASC) 10 MG tablet Take 10 mg by mouth every morning. 04/17/20   [provider]  ASPIRIN LOW DOSE 81 MG EC tablet Take 81 mg by mouth in the morning. 02/22/21   [provider]  atorvastatin (LIPITOR) 40 MG tablet Take 40 mg by mouth in the morning. 02/09/21   [provider]  calcium acetate (PHOSLO) 667 MG capsule Take 2 capsules (1,334 mg total) by mouth with breakfast, with lunch, and with evening meal. 04/10/21   Calvert Cantor, MD  Calcium Carbonate Antacid (CALCIUM CARBONATE, DOSED IN MG ELEMENTAL CALCIUM,) 1250 MG/5ML SUSP Take 5 mLs (500 mg of elemental calcium total) by mouth every 6 (six) hours as needed for indigestion. 04/08/23   Rhetta Mura, MD  carvedilol (COREG) 12.5 MG tablet Take 12.5 mg by mouth 2 (two) times daily with a meal. 04/17/20   [provider]  Cholecalciferol (VITAMIN D) 50 MCG (2000 UT) CAPS Take 2,000 Units by mouth daily.    [provider]  clopidogrel (PLAVIX) 75 MG tablet Take 1 tablet (75 mg total) by mouth daily. 11/02/22   Rhyne, Ames Coupe, PA-C  cyclobenzaprine (FLEXERIL) 10 MG tablet Take 10 mg by mouth 3 (three) times daily as needed for muscle spasms. 12/29/21   [provider]  diazepam (VALIUM) 5 MG tablet Take 5 mg by mouth 2 (two) times daily as needed for muscle spasms. 12/09/21   [provider]  insulin regular human CONCENTRATED (HUMULIN R  U-500 KWIKPEN) 500 UNIT/ML KwikPen Inject 45 Units into the skin daily with supper. Patient taking differently: Inject 70-125 Units into the skin 2 (two) times daily with a meal. 09/04/21 08/30/24  [name], MD  metolazone (ZAROXOLYN) 10 MG tablet Take 10 mg by mouth daily. 07/13/21   [provider]  nortriptyline (PAMELOR) 10 MG capsule Take 20 mg by mouth at bedtime. 10/23/19 08/30/24  [provider]  oxyCODONE (ROXICODONE) 5 MG immediate release tablet Take 1 tablet (5 mg total) by mouth every 8 (eight) hours as needed. Patient taking differently: Take 5 mg by mouth every 8 (eight) hours as needed for moderate pain. 12/31/22 12/31/23  [name], [name], DO  pantoprazole (PROTONIX) 40 MG tablet Take 40 mg by mouth daily before breakfast. 02/09/21   [provider]  pregabalin (LYRICA) 75 MG capsule Take 1 capsule (75 mg total) by mouth See admin instructions. Daily.  Give after dialysis on dialysis days. 01/21/23   [name], MD  sertraline (ZOLOFT) 100 MG tablet Take 100 mg by mouth in the morning. 02/22/21   [provider]  tamsulosin (FLOMAX) 0.4 MG CAPS capsule Take 1 capsule (0.4 mg total) by mouth daily. 11/10/21   [name], MD  torsemide (DEMADEX) 20 MG tablet Take 120 mg by mouth 2 (two) times daily.    [provider]  traZODone (DESYREL) 150 MG tablet Take 150 mg by  mouth at bedtime. 12/09/21   [provider]      Allergies    Actos [pioglitazone], Dexmedetomidine, Ibuprofen, Tomato, and Wellbutrin [bupropion]    Review of Systems   Review of Systems  All other systems reviewed and are negative.   Physical Exam Updated Vital Signs BP (!) 148/85 (BP Location: Right Wrist)   Pulse 93   Temp 97.8 F (36.6 C) (Oral)   Resp 16   Ht 5\' 5"  (1.651 m)   SpO2 98%   BMI 17.79 kg/m  Physical Exam Vitals and nursing note reviewed.  Constitutional:      Appearance: Normal appearance.  HENT:     Head: Normocephalic and  atraumatic.  Eyes:     Conjunctiva/sclera: Conjunctivae normal.  Pulmonary:     Effort: Pulmonary effort is normal. No respiratory distress.  Skin:    General: Skin is warm and dry.  Neurological:     Mental Status: He is alert.  Psychiatric:        Mood and Affect: Mood normal.        Behavior: Behavior normal.     ED Results / Procedures / Treatments   Labs (all labs ordered are listed, but only abnormal results are displayed) Labs Reviewed  CBC - Abnormal; Notable for the following components:      Result Value   RBC 3.11 (*)    Hemoglobin 8.4 (*)    HCT 28.9 (*)    MCHC 29.1 (*)    RDW 15.6 (*)    Platelets 72 (*)    All other components within normal limits  RENAL FUNCTION PANEL - Abnormal; Notable for the following components:   Chloride 97 (*)    Glucose, Bld 153 (*)    BUN 68 (*)    Creatinine, Ser 4.51 (*)    Calcium 8.0 (*)    GFR, Estimated 14 (*)    All other components within normal limits  CBC  RENAL FUNCTION PANEL    EKG None  Radiology No results found.  Procedures Procedures    Medications Ordered in ED Medications  Chlorhexidine Gluconate Cloth 2 % PADS 6 each (6 each Topical Not Given 05/16/23 0845)  pentafluoroprop-tetrafluoroeth (GEBAUERS) aerosol 1 Application (has no administration in time range)  lidocaine (PF) (XYLOCAINE) 1 % injection 5 mL (has no administration in time range)  anticoagulant sodium citrate solution 5 mL (has no administration in time range)  alteplase (CATHFLO ACTIVASE) injection 2 mg (has no administration in time range)  heparin injection 2,000 Units (has no administration in time range)  lidocaine-prilocaine (EMLA) cream 1 Application (has no administration in time range)  heparin injection 1,000 Units (has no administration in time range)    ED Course/ Medical Decision Making/ A&P Clinical Course as of 05/16/23 1003  Tue May 16, 2023  0923 Transported to dialysis [LR]    Clinical Course User Index [LR]  Elgar Scoggins, Lora Paula, PA-C                             Medical Decision Making Amount and/or Complexity of Data Reviewed Labs: ordered.   Patient is a 62 year old male with history of DM, HTN, COPD, CHF, ESRD (on hemodialysis T/R/S) who presents for dialysis today. Last dialyzed in hospital three days ago. Has no acute complaints today.   Labs ordered including CBC and renal function panel.  Consulted with Dr Signe Colt with nephrology who will organize dialysis for  patient.  Anticipate discharge after dialysis completed.   Final Clinical Impression(s) / ED Diagnoses Final diagnoses:  Encounter for dialysis [facility])    Rx / DC Orders ED Discharge Orders     None      Portions of this report may have been transcribed using voice recognition software. Every effort was made to ensure accuracy; however, inadvertent computerized transcription errors may be present.    Su Monks, PA-C 05/16/23 1003    Loetta Rough, MD 05/17/23 [phone]

## 2023-05-16 NOTE — Progress Notes (Signed)
Received patient in bed to unit.  Alert and oriented.  Informed consent signed and in chart.   TX duration:3.5  Patient tolerated well.  Transported back to the room  Alert, without acute distress.  Hand-off given to patient's nurse.   Access used: left [facility] Access issues: none  Total UF removed: 3L Medication(s) given: none   05/16/23 1337  Vitals  Temp 98.1 F (36.7 C)  Temp Source Oral  BP (!) 148/63  MAP (mmHg) 81  BP Location Right Wrist  BP Method Automatic  Patient Position (if appropriate) Lying  Pulse Rate 83  Pulse Rate Source Monitor  ECG Heart Rate 83  Resp 18  Oxygen Therapy  SpO2 92 %  O2 Device Nasal Cannula  O2 Flow Rate (L/min) 3 L/min  During Treatment Monitoring  HD Safety Checks Performed Yes  Intra-Hemodialysis Comments Tx completed;Tolerated well  Dialysis Fluid Bolus Normal Saline  Bolus Amount (mL) 300 mL      [name]

## 2023-05-16 NOTE — ED Triage Notes (Signed)
Pt presents for routine hemodialysis.  Denies medical complaints and has not missed any treatments,

## 2023-05-16 NOTE — ED Provider Notes (Signed)
Patient returned from dialysis. Feeling well, no complaints. Discharged in stable condition.    Su Monks, PA-C 05/16/23 1430    Loetta Rough, MD 05/17/23 [phone]

## 2023-05-18 ENCOUNTER — Emergency Department (HOSPITAL_COMMUNITY)
Admission: EM | Admit: 2023-05-18 | Discharge: 2023-05-19 | Disposition: A | Discharge status: Home / Self Care | Payer: 59 | Attending: Emergency Medicine | Admitting: Emergency Medicine

## 2023-05-18 DIAGNOSIS — Z992 Dependence on renal dialysis: Secondary | ICD-10-CM | POA: Diagnosis not present

## 2023-05-18 DIAGNOSIS — Z452 Encounter for adjustment and management of vascular access device: Secondary | ICD-10-CM | POA: Diagnosis not present

## 2023-05-18 DIAGNOSIS — D631 Anemia in chronic kidney disease: Secondary | ICD-10-CM | POA: Diagnosis not present

## 2023-05-18 DIAGNOSIS — N186 End stage renal disease: Secondary | ICD-10-CM | POA: Diagnosis present

## 2023-05-18 LAB — CBC WITH DIFFERENTIAL/PLATELET
Abs Immature Granulocytes: 0.03 10*3/uL (ref 0.00–0.07)
Basophils Absolute: 0 10*3/uL (ref 0.0–0.1)
Basophils Relative: 0 %
Eosinophils Absolute: 0.1 10*3/uL (ref 0.0–0.5)
Eosinophils Relative: 2 %
HCT: 26.4 % — ABNORMAL LOW (ref 39.0–52.0)
Hemoglobin: 8.2 g/dL — ABNORMAL LOW (ref 13.0–17.0)
Immature Granulocytes: 1 %
Lymphocytes Relative: 12 %
Lymphs Abs: 0.6 10*3/uL — ABNORMAL LOW (ref 0.7–4.0)
MCH: 27.6 pg (ref 26.0–34.0)
MCHC: 31.1 g/dL (ref 30.0–36.0)
MCV: 88.9 fL (ref 80.0–100.0)
Monocytes Absolute: 0.5 10*3/uL (ref 0.1–1.0)
Monocytes Relative: 10 %
Neutro Abs: 3.7 10*3/uL (ref 1.7–7.7)
Neutrophils Relative %: 75 %
Platelets: 86 10*3/uL — ABNORMAL LOW (ref 150–400)
RBC: 2.97 MIL/uL — ABNORMAL LOW (ref 4.22–5.81)
RDW: 15.4 % (ref 11.5–15.5)
WBC: 4.9 10*3/uL (ref 4.0–10.5)
nRBC: 0 % (ref 0.0–0.2)

## 2023-05-18 LAB — BASIC METABOLIC PANEL
Anion gap: 11 (ref 5–15)
BUN: 53 mg/dL — ABNORMAL HIGH (ref 8–23)
CO2: 26 mmol/L (ref 22–32)
Calcium: 7.8 mg/dL — ABNORMAL LOW (ref 8.9–10.3)
Chloride: 95 mmol/L — ABNORMAL LOW (ref 98–111)
Creatinine, Ser: 4.37 mg/dL — ABNORMAL HIGH (ref 0.61–1.24)
GFR, Estimated: 14 mL/min — ABNORMAL LOW (ref >60)
Glucose, Bld: 331 mg/dL — ABNORMAL HIGH (ref 70–99)
Potassium: 4.3 mmol/L (ref 3.5–5.1)
Sodium: 132 mmol/L — ABNORMAL LOW (ref 135–145)

## 2023-05-18 MED ORDER — HEPARIN SODIUM (PORCINE) 1000 UNIT/ML IJ SOLN
INTRAMUSCULAR | Status: AC
  Filled 2023-05-18: qty 2

## 2023-05-18 MED ORDER — ALTEPLASE 2 MG IJ SOLR: 2.0000 mg | Freq: Once | INTRAMUSCULAR | Status: DC | PRN

## 2023-05-18 MED ORDER — CHLORHEXIDINE GLUCONATE CLOTH 2 % EX PADS: 6.0000 | MEDICATED_PAD | Freq: Every day | CUTANEOUS | Status: DC

## 2023-05-18 MED ORDER — HEPARIN SODIUM (PORCINE) 1000 UNIT/ML DIALYSIS: 1000.0000 [IU] | INTRAMUSCULAR | Status: DC | PRN

## 2023-05-18 MED ORDER — DARBEPOETIN ALFA 150 MCG/0.3ML IJ SOSY
150.0000 ug | PREFILLED_SYRINGE | Freq: Once | INTRAMUSCULAR | Status: AC
  Administered 2023-05-18: 150 ug via SUBCUTANEOUS
  Filled 2023-05-18: qty 0.3

## 2023-05-18 MED ORDER — LIDOCAINE HCL (PF) 1 % IJ SOLN: 5.0000 mL | INTRAMUSCULAR | Status: DC | PRN

## 2023-05-18 MED ORDER — HEPARIN SODIUM (PORCINE) 1000 UNIT/ML DIALYSIS
2000.0000 [IU] | INTRAMUSCULAR | Status: DC | PRN
  Administered 2023-05-18: 2000 [IU] via INTRAVENOUS_CENTRAL

## 2023-05-18 MED ORDER — SODIUM CHLORIDE 0.9 % IV SOLN
250.0000 mg | Freq: Once | INTRAVENOUS | Status: AC
  Administered 2023-05-18: 250 mg via INTRAVENOUS
  Filled 2023-05-18: qty 20

## 2023-05-18 MED ORDER — LIDOCAINE-PRILOCAINE 2.5-2.5 % EX CREA: 1.0000 | TOPICAL_CREAM | CUTANEOUS | Status: DC | PRN

## 2023-05-18 MED ORDER — ANTICOAGULANT SODIUM CITRATE 4% (200MG/5ML) IV SOLN: 5.0000 mL | Status: DC | PRN

## 2023-05-18 MED ORDER — PENTAFLUOROPROP-TETRAFLUOROETH EX AERO: 1.0000 | INHALATION_SPRAY | CUTANEOUS | Status: DC | PRN

## 2023-05-18 NOTE — Progress Notes (Signed)
   05/18/23 1321  Vitals  Temp 97.9 F (36.6 C)  Pulse Rate 84  Resp 11  BP (!) 158/93  SpO2 100 %  O2 Device Nasal Cannula  Oxygen Therapy  O2 Flow Rate (L/min) 3 L/min  Post Treatment  Dialyzer Clearance Lightly streaked  Duration of HD Treatment -hour(s) 3.5 hour(s)  Hemodialysis Intake (mL) 0 mL  Liters Processed 84  Fluid Removed (mL) 3000 mL  Tolerated HD Treatment Yes  Post-Hemodialysis Comments tx completed with no issues. goal for today met. PT/ vitals stable. PT alert. No complaints ATM.  AVG/AVF Arterial Site Held (minutes) 8 minutes  AVG/AVF Venous Site Held (minutes) 8 minutes   Received patient in bed to unit.  Alert and oriented.  Informed consent signed and in chart.   TX duration:3.5 hours  Patient tolerated well.  Transported back to the room  Alert, without acute distress.  Hand-off given to patient's nurse.   Access used: RAVG Access issues: None  Total UF removed: 3000 Medication(s) given: See MAR Post HD VS: See Above Grid Post HD weight: Unable to get Weight   [facility]

## 2023-05-18 NOTE — Progress Notes (Signed)
We are asked to see this patient for hospital dialysis. Pt was discharged from his OP HD unit in December 2023 due to behavioral issues. Pt came to ED today requesting hospital dialysis. Plan is for HD upstairs then will return to ED for reassessment and expected discharge home.      Last OP HD orders (dec 2023):  3:15h  129kg  RUE AVG   Heparin 2000    - last hep B labs here were - neg Hep B SAg from 05/06/23         - + hep B S Ab's (32.4) from 04/07/23    - pt typically comes on Tu, Th and Sat for hospital HD   Anemia of esrd:  Tsat was low on 5/14 at 11% --> pharm suggested ferric gluconate 1-2 gms given as 250mg  q HD. We gave him 1 gm load over 4 HD sessions as below.     Repeat iron panel needs to be drawn- will see if we can add it on today.    Ordered Aranesp 150 mcg x 1.   HD access: Pt had new LUA AVG and LIJ TDC placed on 04/07/23 by Dr Edilia Bo.  Approaching readiness for use.      Bufford Buttner MD [facility] [phone] 05/18/2023, 10:55 AM

## 2023-05-18 NOTE — ED Notes (Signed)
Provider at bedside

## 2023-05-18 NOTE — ED Notes (Signed)
Pt to dialysis.

## 2023-05-18 NOTE — ED Provider Notes (Signed)
EMERGENCY DEPARTMENT AT [facility] Provider Note   CSN: [id number] Arrival date & time: 05/18/23  4098     History  Chief Complaint  Patient presents with   Vascular Access Problem    Jeffrey [name] is a 62 y.o. male.  62 year old male with ESRD presents for routine dialysis.  Does not have outpatient dialysis center set up.  Denies any chest pain, shortness of breath, or other complaints.  States he is here for his routine dialysis.  He wears 3 L via nasal cannula at baseline.  The history is provided by the patient. No language interpreter was used.       Home Medications Prior to Admission medications   Medication Sig Start Date End Date Taking? Authorizing Provider  amLODipine (NORVASC) 10 MG tablet Take 10 mg by mouth every morning. 04/17/20   [provider]  ASPIRIN LOW DOSE 81 MG EC tablet Take 81 mg by mouth in the morning. 02/22/21   [provider]  atorvastatin (LIPITOR) 40 MG tablet Take 40 mg by mouth in the morning. 02/09/21   [provider]  calcium acetate (PHOSLO) 667 MG capsule Take 2 capsules (1,334 mg total) by mouth with breakfast, with lunch, and with evening meal. 04/10/21   Calvert Cantor, MD  Calcium Carbonate Antacid (CALCIUM CARBONATE, DOSED IN MG ELEMENTAL CALCIUM,) 1250 MG/5ML SUSP Take 5 mLs (500 mg of elemental calcium total) by mouth every 6 (six) hours as needed for indigestion. 04/08/23   Rhetta Mura, MD  carvedilol (COREG) 12.5 MG tablet Take 12.5 mg by mouth 2 (two) times daily with a meal. 04/17/20   [provider]  Cholecalciferol (VITAMIN D) 50 MCG (2000 UT) CAPS Take 2,000 Units by mouth daily.    [provider]  clopidogrel (PLAVIX) 75 MG tablet Take 1 tablet (75 mg total) by mouth daily. 11/02/22   Rhyne, Ames Coupe, PA-C  cyclobenzaprine (FLEXERIL) 10 MG tablet Take 10 mg by mouth 3 (three) times daily as needed for muscle spasms. 12/29/21   [provider]   diazepam (VALIUM) 5 MG tablet Take 5 mg by mouth 2 (two) times daily as needed for muscle spasms. 12/09/21   [provider]  insulin regular human CONCENTRATED (HUMULIN R U-500 KWIKPEN) 500 UNIT/ML KwikPen Inject 45 Units into the skin daily with supper. Patient taking differently: Inject 70-125 Units into the skin 2 (two) times daily with a meal. 09/04/21 08/30/24  [name], MD  metolazone (ZAROXOLYN) 10 MG tablet Take 10 mg by mouth daily. 07/13/21   [provider]  nortriptyline (PAMELOR) 10 MG capsule Take 20 mg by mouth at bedtime. 10/23/19 08/30/24  [provider]  oxyCODONE (ROXICODONE) 5 MG immediate release tablet Take 1 tablet (5 mg total) by mouth every 8 (eight) hours as needed. Patient taking differently: Take 5 mg by mouth every 8 (eight) hours as needed for moderate pain. 12/31/22 12/31/23  [name], [name], DO  pantoprazole (PROTONIX) 40 MG tablet Take 40 mg by mouth daily before breakfast. 02/09/21   [provider]  pregabalin (LYRICA) 75 MG capsule Take 1 capsule (75 mg total) by mouth See admin instructions. Daily.  Give after dialysis on dialysis days. 01/21/23   [name], MD  sertraline (ZOLOFT) 100 MG tablet Take 100 mg by mouth in the morning. 02/22/21   [provider]  tamsulosin (FLOMAX) 0.4 MG CAPS capsule Take 1 capsule (0.4 mg total) by mouth daily. 11/10/21   Pahwani,  Daleen Bo, MD  torsemide (DEMADEX) 20 MG tablet Take 120 mg by mouth 2 (two) times daily.    [provider]  traZODone (DESYREL) 150 MG tablet Take 150 mg by mouth at bedtime. 12/09/21   [provider]      Allergies    Actos [pioglitazone], Dexmedetomidine, Ibuprofen, Tomato, and Wellbutrin [bupropion]    Review of Systems   Review of Systems  Respiratory:  Negative for shortness of breath.   Cardiovascular:  Negative for chest pain.  All other systems reviewed and are negative.   Physical Exam Updated Vital Signs BP 136/76 (BP  Location: Right Arm)   Pulse 90   Temp 98.2 F (36.8 C) (Oral)   Resp (!) 22   Ht 5\' 5"  (1.651 m)   Wt 48.5 kg   SpO2 95%   BMI 17.79 kg/m  Physical Exam Vitals and nursing note reviewed.  Constitutional:      General: He is not in acute distress.    Appearance: Normal appearance. He is not ill-appearing.  HENT:     Head: Normocephalic and atraumatic.     Nose: Nose normal.  Eyes:     Conjunctiva/sclera: Conjunctivae normal.  Cardiovascular:     Rate and Rhythm: Normal rate.  Pulmonary:     Effort: Pulmonary effort is normal. No respiratory distress.  Musculoskeletal:        General: No deformity. Normal range of motion.  Skin:    Findings: No rash.  Neurological:     Mental Status: He is alert.     ED Results / Procedures / Treatments   Labs (all labs ordered are listed, but only abnormal results are displayed) Labs Reviewed - No data to display  EKG None  Radiology No results found.  Procedures Procedures    Medications Ordered in ED Medications  Chlorhexidine Gluconate Cloth 2 % PADS 6 each (has no administration in time range)    ED Course/ Medical Decision Making/ A&P                             Medical Decision Making Risk Prescription drug management.   62 year old male with history of ESRD presents for routine dialysis.  Does not have any complaints.  Last dialysis session was 6/18.  Labs performed then including CBC, and renal function panel.  Demonstrated stable labs.  Will hold off on repeating labs.  Will notify nephrologist regarding patient's arrival for routine dialysis. Notified nephrologist who will set up dialysis session for today.   Final Clinical Impression(s) / ED Diagnoses Final diagnoses:  ESRD (end stage renal disease) [facility])    Rx / DC Orders ED Discharge Orders     None         Marita [place], PA-C 05/18/23 1610    Pricilla Loveless, MD 05/20/23 1441

## 2023-05-18 NOTE — ED Triage Notes (Signed)
Pt here for dialysis. NAD noted  

## 2023-05-20 ENCOUNTER — Encounter (HOSPITAL_COMMUNITY): Payer: Self-pay | Admitting: *Deleted

## 2023-05-20 ENCOUNTER — Emergency Department (HOSPITAL_COMMUNITY)
Admission: EM | Admit: 2023-05-20 | Discharge: 2023-05-20 | Disposition: A | Discharge status: Home / Self Care | Payer: 59 | Attending: Emergency Medicine | Admitting: Emergency Medicine

## 2023-05-20 ENCOUNTER — Other Ambulatory Visit: Payer: Self-pay

## 2023-05-20 DIAGNOSIS — Z794 Long term (current) use of insulin: Secondary | ICD-10-CM | POA: Insufficient documentation

## 2023-05-20 DIAGNOSIS — Z7901 Long term (current) use of anticoagulants: Secondary | ICD-10-CM | POA: Diagnosis not present

## 2023-05-20 DIAGNOSIS — Z992 Dependence on renal dialysis: Secondary | ICD-10-CM | POA: Diagnosis not present

## 2023-05-20 DIAGNOSIS — N186 End stage renal disease: Secondary | ICD-10-CM | POA: Diagnosis present

## 2023-05-20 DIAGNOSIS — Z7982 Long term (current) use of aspirin: Secondary | ICD-10-CM | POA: Diagnosis not present

## 2023-05-20 MED ORDER — HEPARIN SODIUM (PORCINE) 1000 UNIT/ML IJ SOLN
INTRAMUSCULAR | Status: AC
  Filled 2023-05-20: qty 2

## 2023-05-20 MED ORDER — CHLORHEXIDINE GLUCONATE CLOTH 2 % EX PADS: 6.0000 | MEDICATED_PAD | Freq: Every day | CUTANEOUS | Status: DC

## 2023-05-20 NOTE — ED Notes (Signed)
Pt transported to dialysis

## 2023-05-20 NOTE — Progress Notes (Addendum)
[facility] BRIEF PROGRESS NOTE  Jeffrey [name] [id number] [birth year]  Patient presents today for hospital dialysis.  He was discharge from outpatient unit in December 2023 d/t behavioral issues. Plan for HD upstairs when able with return to ED after.  Last OP HD orders (dec 2023):  3:15h  129kg  RUE AVG   Heparin 2000    - last hep B labs here were - neg Hep B SAg from 05/06/23         - + hep B S Ab's (32.4) from 04/07/23    - pt typically comes on Tu, Th and Sat for hospital HD   Anemia of esrd:  Tsat was low on 5/14 at 11% --> pharm suggested ferric gluconate 1-2 gms given as 250mg  q HD. We gave him 1 gm load over 4 HD sessions as below.   Ordered repeat iron panel today.    HD access: Pt had new LUA AVG and LIJ TDC placed on 04/07/23 by Dr Edilia Bo.   Plan to use AVG today with 15G needle for 2nd time.  Will request IR to remove [facility] on Tuesday post HD if successful use again.   Virgina Norfolk, PA-C [facility]   ADDEND: I personally saw the patient and performed a substantive portion of this encounter, including a complete performance of at least one of the key components (MDM, Hx and/or Exam), in conjunction with the Advanced Practice Provider Virgina Norfolk, PA-C.  Here for routine dialysis.  No complaints today.  Will go for 3L UF goal.  Has functioning AVG- remove [facility] Tuesday.  Will also call in a script for calcitriol 0.25 mcg daily to [facility] on [address] per pt request.  Greta Doom 05/20/2023 9:11 AM

## 2023-05-20 NOTE — Discharge Instructions (Signed)
Return for any problem.  ?

## 2023-05-20 NOTE — Procedures (Signed)
Patient seen and examined on Hemodialysis. The procedure was supervised and I have made appropriate changes. BP (!) 98/54 (BP Location: Right Arm)   Pulse 97   Temp 98.9 F (37.2 C) (Oral)   Resp 18   Ht 5\' 5"  (1.651 m)   Wt 48.5 kg   SpO2 94%   BMI 17.79 kg/m   QB 400 mL/ min via AVG 2 15 g needles, UF goal 3L  Tolerating treatment without complaints at this time.   Bufford Buttner MD [facility] [phone] 9:09 AM

## 2023-05-20 NOTE — Progress Notes (Signed)
   05/20/23 1236  Vitals  Temp (!) 97.5 F (36.4 C)  Pulse Rate 75  Resp 16  BP (!) 159/76  SpO2 98 %  Post Treatment  Dialyzer Clearance Clear  Duration of HD Treatment -hour(s) 3.25 hour(s)  Hemodialysis Intake (mL) 0 mL  Liters Processed 75.3  Fluid Removed (mL) 2000 mL  Tolerated HD Treatment Yes  AVG/AVF Arterial Site Held (minutes) 8 minutes  AVG/AVF Venous Site Held (minutes) 8 minutes   Received patient in bed to unit.  Alert and oriented.  Informed consent signed and in chart.   TX duration:3.25hrs  Patient tolerated well.  Transported back to the room  Alert, without acute distress.  Hand-off given to patient's nurse.   Access used: LAVG Access issues: none  Total UF removed: 2L Medication(s) given: none    [facility]

## 2023-05-20 NOTE — ED Provider Notes (Signed)
[place] EMERGENCY DEPARTMENT AT [facility] Provider Note   CSN: [id number] Arrival date & time: 05/20/23  2595     History  No chief complaint on file.   Jeffrey [name] is a 62 y.o. male.  62 year old male with prior medical history as detailed below presents for evaluation.  Patient presents for routine dialysis.  Patient with longstanding history of ESRD.  Patient is currently being dialyzed through the ED after losing his outpatient dialysis chair.  He is otherwise without complaint today.  The history is provided by the patient and medical records.       Home Medications Prior to Admission medications   Medication Sig Start Date End Date Taking? Authorizing Provider  amLODipine (NORVASC) 10 MG tablet Take 10 mg by mouth every morning. 04/17/20   [provider]  ASPIRIN LOW DOSE 81 MG EC tablet Take 81 mg by mouth in the morning. 02/22/21   [provider]  atorvastatin (LIPITOR) 40 MG tablet Take 40 mg by mouth in the morning. 02/09/21   [provider]  calcium acetate (PHOSLO) 667 MG capsule Take 2 capsules (1,334 mg total) by mouth with breakfast, with lunch, and with evening meal. 04/10/21   Calvert Cantor, MD  Calcium Carbonate Antacid (CALCIUM CARBONATE, DOSED IN MG ELEMENTAL CALCIUM,) 1250 MG/5ML SUSP Take 5 mLs (500 mg of elemental calcium total) by mouth every 6 (six) hours as needed for indigestion. 04/08/23   Rhetta Mura, MD  carvedilol (COREG) 12.5 MG tablet Take 12.5 mg by mouth 2 (two) times daily with a meal. 04/17/20   [provider]  Cholecalciferol (VITAMIN D) 50 MCG (2000 UT) CAPS Take 2,000 Units by mouth daily.    [provider]  clopidogrel (PLAVIX) 75 MG tablet Take 1 tablet (75 mg total) by mouth daily. 11/02/22   Rhyne, Ames Coupe, PA-C  cyclobenzaprine (FLEXERIL) 10 MG tablet Take 10 mg by mouth 3 (three) times daily as needed for muscle spasms. 12/29/21   [provider]  diazepam  (VALIUM) 5 MG tablet Take 5 mg by mouth 2 (two) times daily as needed for muscle spasms. 12/09/21   [provider]  insulin regular human CONCENTRATED (HUMULIN R U-500 KWIKPEN) 500 UNIT/ML KwikPen Inject 45 Units into the skin daily with supper. Patient taking differently: Inject 70-125 Units into the skin 2 (two) times daily with a meal. 09/04/21 08/30/24  [name], MD  metolazone (ZAROXOLYN) 10 MG tablet Take 10 mg by mouth daily. 07/13/21   [provider]  nortriptyline (PAMELOR) 10 MG capsule Take 20 mg by mouth at bedtime. 10/23/19 08/30/24  [provider]  oxyCODONE (ROXICODONE) 5 MG immediate release tablet Take 1 tablet (5 mg total) by mouth every 8 (eight) hours as needed. Patient taking differently: Take 5 mg by mouth every 8 (eight) hours as needed for moderate pain. 12/31/22 12/31/23  [name], [name], DO  pantoprazole (PROTONIX) 40 MG tablet Take 40 mg by mouth daily before breakfast. 02/09/21   [provider]  pregabalin (LYRICA) 75 MG capsule Take 1 capsule (75 mg total) by mouth See admin instructions. Daily.  Give after dialysis on dialysis days. 01/21/23   [name], MD  sertraline (ZOLOFT) 100 MG tablet Take 100 mg by mouth in the morning. 02/22/21   [provider]  tamsulosin (FLOMAX) 0.4 MG CAPS capsule Take 1 capsule (0.4 mg total) by mouth daily. 11/10/21   [name], MD  torsemide (DEMADEX) 20 MG tablet Take  120 mg by mouth 2 (two) times daily.    [provider]  traZODone (DESYREL) 150 MG tablet Take 150 mg by mouth at bedtime. 12/09/21   [provider]      Allergies    Actos [pioglitazone], Dexmedetomidine, Ibuprofen, Tomato, and Wellbutrin [bupropion]    Review of Systems   Review of Systems  All other systems reviewed and are negative.   Physical Exam Updated Vital Signs There were no vitals taken for this visit. Physical Exam Vitals and nursing note reviewed.  Constitutional:       General: He is not in acute distress.    Appearance: Normal appearance. He is well-developed.  HENT:     Head: Normocephalic and atraumatic.  Eyes:     Conjunctiva/sclera: Conjunctivae normal.     Pupils: Pupils are equal, round, and reactive to light.  Cardiovascular:     Rate and Rhythm: Normal rate and regular rhythm.     Heart sounds: Normal heart sounds.  Pulmonary:     Effort: Pulmonary effort is normal. No respiratory distress.     Breath sounds: Normal breath sounds.  Abdominal:     General: There is no distension.     Palpations: Abdomen is soft.     Tenderness: There is no abdominal tenderness.  Musculoskeletal:        General: No deformity. Normal range of motion.     Cervical back: Normal range of motion and neck supple.  Skin:    General: Skin is warm and dry.  Neurological:     General: No focal deficit present.     Mental Status: He is alert and oriented to person, place, and time.     ED Results / Procedures / Treatments   Labs (all labs ordered are listed, but only abnormal results are displayed) Labs Reviewed - No data to display  EKG None  Radiology No results found.  Procedures Procedures    Medications Ordered in ED Medications - No data to display  ED Course/ Medical Decision Making/ A&P                             Medical Decision Making   Medical Screen Complete  This patient presented to the ED with complaint of needs dialysis.  This complaint involves an extensive number of treatment options. The initial differential diagnosis includes, but is not limited to, ESRD, on routine HD  This presentation is: Chronic, Self-Limited, Previously Undiagnosed, Uncertain Prognosis, Complicated, Systemic Symptoms, and Threat to Life/Bodily Function  Patient presents for routine hemodialysis.  Patient without current outpatient dialysis access.  Nephrology Ronalee Belts) made aware of case and will arrange dialysis today.  Patient likely  appropriate for discharge after completion of dialysis session.   Additional history obtained: External records from outside sources obtained and reviewed including prior ED visits and prior Inpatient records.   Problem List / ED Course:  ESRD on HD Disposition:  After consideration of the diagnostic results and the patients response to treatment, I feel that the patent would benefit from close outpatient followup.          Final Clinical Impression(s) / ED Diagnoses Final diagnoses:  ESRD (end stage renal disease) (HCC)    Rx / DC Orders ED Discharge Orders     None         Wynetta Fines, MD 05/20/23 1610

## 2023-05-20 NOTE — ED Triage Notes (Signed)
Patient is here for his routine dialysis treatment. No other complaints.

## 2023-05-23 ENCOUNTER — Encounter (HOSPITAL_COMMUNITY): Payer: Self-pay

## 2023-05-23 ENCOUNTER — Other Ambulatory Visit: Payer: Self-pay

## 2023-05-23 ENCOUNTER — Emergency Department (HOSPITAL_COMMUNITY)
Admission: EM | Admit: 2023-05-23 | Discharge: 2023-05-23 | Disposition: A | Discharge status: Home / Self Care | Payer: 59 | Attending: Emergency Medicine | Admitting: Emergency Medicine

## 2023-05-23 DIAGNOSIS — E1122 Type 2 diabetes mellitus with diabetic chronic kidney disease: Secondary | ICD-10-CM | POA: Diagnosis not present

## 2023-05-23 DIAGNOSIS — Z7902 Long term (current) use of antithrombotics/antiplatelets: Secondary | ICD-10-CM | POA: Diagnosis not present

## 2023-05-23 DIAGNOSIS — I509 Heart failure, unspecified: Secondary | ICD-10-CM | POA: Diagnosis not present

## 2023-05-23 DIAGNOSIS — Z7982 Long term (current) use of aspirin: Secondary | ICD-10-CM | POA: Diagnosis not present

## 2023-05-23 DIAGNOSIS — N186 End stage renal disease: Secondary | ICD-10-CM | POA: Diagnosis not present

## 2023-05-23 DIAGNOSIS — Z794 Long term (current) use of insulin: Secondary | ICD-10-CM | POA: Insufficient documentation

## 2023-05-23 DIAGNOSIS — I132 Hypertensive heart and chronic kidney disease with heart failure and with stage 5 chronic kidney disease, or end stage renal disease: Secondary | ICD-10-CM | POA: Insufficient documentation

## 2023-05-23 DIAGNOSIS — Z79899 Other long term (current) drug therapy: Secondary | ICD-10-CM | POA: Diagnosis not present

## 2023-05-23 DIAGNOSIS — Z992 Dependence on renal dialysis: Secondary | ICD-10-CM | POA: Diagnosis present

## 2023-05-23 DIAGNOSIS — J449 Chronic obstructive pulmonary disease, unspecified: Secondary | ICD-10-CM | POA: Insufficient documentation

## 2023-05-23 LAB — RENAL FUNCTION PANEL
Albumin: 3.3 g/dL — ABNORMAL LOW (ref 3.5–5.0)
Anion gap: 9 (ref 5–15)
BUN: 52 mg/dL — ABNORMAL HIGH (ref 8–23)
CO2: 26 mmol/L (ref 22–32)
Calcium: 8 mg/dL — ABNORMAL LOW (ref 8.9–10.3)
Chloride: 99 mmol/L (ref 98–111)
Creatinine, Ser: 4.45 mg/dL — ABNORMAL HIGH (ref 0.61–1.24)
GFR, Estimated: 14 mL/min — ABNORMAL LOW (ref >60)
Glucose, Bld: 203 mg/dL — ABNORMAL HIGH (ref 70–99)
Phosphorus: 4.3 mg/dL (ref 2.5–4.6)
Potassium: 3.9 mmol/L (ref 3.5–5.1)
Sodium: 134 mmol/L — ABNORMAL LOW (ref 135–145)

## 2023-05-23 LAB — CBC WITH DIFFERENTIAL/PLATELET
Abs Immature Granulocytes: 0.02 10*3/uL (ref 0.00–0.07)
Basophils Absolute: 0 10*3/uL (ref 0.0–0.1)
Basophils Relative: 0 %
Eosinophils Absolute: 0.2 10*3/uL (ref 0.0–0.5)
Eosinophils Relative: 3 %
HCT: 28.9 % — ABNORMAL LOW (ref 39.0–52.0)
Hemoglobin: 8.8 g/dL — ABNORMAL LOW (ref 13.0–17.0)
Immature Granulocytes: 0 %
Lymphocytes Relative: 8 %
Lymphs Abs: 0.5 10*3/uL — ABNORMAL LOW (ref 0.7–4.0)
MCH: 27.1 pg (ref 26.0–34.0)
MCHC: 30.4 g/dL (ref 30.0–36.0)
MCV: 88.9 fL (ref 80.0–100.0)
Monocytes Absolute: 0.4 10*3/uL (ref 0.1–1.0)
Monocytes Relative: 6 %
Neutro Abs: 5.1 10*3/uL (ref 1.7–7.7)
Neutrophils Relative %: 83 %
Platelets: 123 10*3/uL — ABNORMAL LOW (ref 150–400)
RBC: 3.25 MIL/uL — ABNORMAL LOW (ref 4.22–5.81)
RDW: 15 % (ref 11.5–15.5)
WBC: 6.2 10*3/uL (ref 4.0–10.5)
nRBC: 0 % (ref 0.0–0.2)

## 2023-05-23 LAB — CBG MONITORING, ED: Glucose-Capillary: 107 mg/dL — ABNORMAL HIGH (ref 70–99)

## 2023-05-23 LAB — IRON AND TIBC
Iron: 26 ug/dL — ABNORMAL LOW (ref 45–182)
Saturation Ratios: 12 % — ABNORMAL LOW (ref 17.9–39.5)
TIBC: 225 ug/dL — ABNORMAL LOW (ref 250–450)
UIBC: 199 ug/dL

## 2023-05-23 LAB — FERRITIN: Ferritin: 451 ng/mL — ABNORMAL HIGH (ref 24–336)

## 2023-05-23 MED ORDER — LIDOCAINE HCL 2 % IJ SOLN
10.0000 mL | Freq: Once | INTRAMUSCULAR | Status: DC
  Filled 2023-05-23: qty 20

## 2023-05-23 MED ORDER — HEPARIN SODIUM (PORCINE) 1000 UNIT/ML DIALYSIS
2000.0000 [IU] | Freq: Once | INTRAMUSCULAR | Status: AC
  Administered 2023-05-23: 2000 [IU] via INTRAVENOUS_CENTRAL

## 2023-05-23 MED ORDER — HEPARIN SODIUM (PORCINE) 1000 UNIT/ML IJ SOLN
INTRAMUSCULAR | Status: AC
  Administered 2023-05-23: 2000 [IU] via INTRAVENOUS_CENTRAL
  Filled 2023-05-23: qty 2

## 2023-05-23 MED ORDER — CHLORHEXIDINE GLUCONATE CLOTH 2 % EX PADS: 6.0000 | MEDICATED_PAD | Freq: Every day | CUTANEOUS | Status: DC

## 2023-05-23 NOTE — ED Provider Notes (Signed)
[place] EMERGENCY DEPARTMENT AT [facility] Provider Note   CSN: [id number] Arrival date & time: 05/23/23  0715     History Chief Complaint  Patient presents with   Routine Dialysis    Jeffrey [name] is a 62 y.o. male. Patient presents to the ED with concerns for dialysis. Patient currently does not have a dialysis center as he was kicked out of his prior center due to behavioral concerns. Tuesday, Thursday, Saturday dialysis. Denies any chest pain, shortness of breath, swelling, nausea, vomiting, or diarrhea.  HPI     Home Medications Prior to Admission medications   Medication Sig Start Date End Date Taking? Authorizing Provider  amLODipine (NORVASC) 10 MG tablet Take 10 mg by mouth every morning. 04/17/20   [provider]  ASPIRIN LOW DOSE 81 MG EC tablet Take 81 mg by mouth in the morning. 02/22/21   [provider]  atorvastatin (LIPITOR) 40 MG tablet Take 40 mg by mouth in the morning. 02/09/21   [provider]  calcium acetate (PHOSLO) 667 MG capsule Take 2 capsules (1,334 mg total) by mouth with breakfast, with lunch, and with evening meal. 04/10/21   Calvert Cantor, MD  Calcium Carbonate Antacid (CALCIUM CARBONATE, DOSED IN MG ELEMENTAL CALCIUM,) 1250 MG/5ML SUSP Take 5 mLs (500 mg of elemental calcium total) by mouth every 6 (six) hours as needed for indigestion. 04/08/23   Rhetta Mura, MD  carvedilol (COREG) 12.5 MG tablet Take 12.5 mg by mouth 2 (two) times daily with a meal. 04/17/20   [provider]  Cholecalciferol (VITAMIN D) 50 MCG (2000 UT) CAPS Take 2,000 Units by mouth daily.    [provider]  clopidogrel (PLAVIX) 75 MG tablet Take 1 tablet (75 mg total) by mouth daily. 11/02/22   Rhyne, Ames Coupe, PA-C  cyclobenzaprine (FLEXERIL) 10 MG tablet Take 10 mg by mouth 3 (three) times daily as needed for muscle spasms. 12/29/21   [provider]  diazepam (VALIUM) 5 MG tablet Take 5 mg by mouth 2  (two) times daily as needed for muscle spasms. 12/09/21   [provider]  insulin regular human CONCENTRATED (HUMULIN R U-500 KWIKPEN) 500 UNIT/ML KwikPen Inject 45 Units into the skin daily with supper. Patient taking differently: Inject 70-125 Units into the skin 2 (two) times daily with a meal. 09/04/21 08/30/24  [name], MD  metolazone (ZAROXOLYN) 10 MG tablet Take 10 mg by mouth daily. 07/13/21   [provider]  nortriptyline (PAMELOR) 10 MG capsule Take 20 mg by mouth at bedtime. 10/23/19 08/30/24  [provider]  oxyCODONE (ROXICODONE) 5 MG immediate release tablet Take 1 tablet (5 mg total) by mouth every 8 (eight) hours as needed. Patient taking differently: Take 5 mg by mouth every 8 (eight) hours as needed for moderate pain. 12/31/22 12/31/23  [name], [name], DO  pantoprazole (PROTONIX) 40 MG tablet Take 40 mg by mouth daily before breakfast. 02/09/21   [provider]  pregabalin (LYRICA) 75 MG capsule Take 1 capsule (75 mg total) by mouth See admin instructions. Daily.  Give after dialysis on dialysis days. 01/21/23   [name], MD  sertraline (ZOLOFT) 100 MG tablet Take 100 mg by mouth in the morning. 02/22/21   [provider]  tamsulosin (FLOMAX) 0.4 MG CAPS capsule Take 1 capsule (0.4 mg total) by mouth daily. 11/10/21   [name], MD  torsemide (DEMADEX) 20 MG tablet Take 120 mg by mouth 2 (two) times daily.  [provider]  traZODone (DESYREL) 150 MG tablet Take 150 mg by mouth at bedtime. 12/09/21   [provider]      Allergies    Actos [pioglitazone], Dexmedetomidine, Ibuprofen, Tomato, and Wellbutrin [bupropion]    Review of Systems   Review of Systems  Constitutional: Negative.   All other systems reviewed and are negative.   Physical Exam Updated Vital Signs BP (!) 134/47   Pulse 81   Temp 98.2 F (36.8 C) (Oral)   Resp 15   Ht 5\' 5"  (1.651 m)   SpO2 97%   BMI 17.79 kg/m  Physical  Exam Vitals and nursing note reviewed.  HENT:     Head: Normocephalic and atraumatic.  Eyes:     General: No scleral icterus.       Right eye: No discharge.        Left eye: No discharge.  Cardiovascular:     Rate and Rhythm: Normal rate and regular rhythm.     Pulses: Normal pulses.     Heart sounds: Normal heart sounds. No murmur heard. Pulmonary:     Effort: Pulmonary effort is normal.     Breath sounds: Normal breath sounds.  Skin:    Findings: No rash.     ED Results / Procedures / Treatments   Labs (all labs ordered are listed, but only abnormal results are displayed) Labs Reviewed  CBC WITH DIFFERENTIAL/PLATELET - Abnormal; Notable for the following components:      Result Value   RBC 3.25 (*)    Hemoglobin 8.8 (*)    HCT 28.9 (*)    Platelets 123 (*)    Lymphs Abs 0.5 (*)    All other components within normal limits  RENAL FUNCTION PANEL - Abnormal; Notable for the following components:   Sodium 134 (*)    Glucose, Bld 203 (*)    BUN 52 (*)    Creatinine, Ser 4.45 (*)    Calcium 8.0 (*)    Albumin 3.3 (*)    GFR, Estimated 14 (*)    All other components within normal limits  IRON AND TIBC - Abnormal; Notable for the following components:   Iron 26 (*)    TIBC 225 (*)    Saturation Ratios 12 (*)    All other components within normal limits  FERRITIN - Abnormal; Notable for the following components:   Ferritin 451 (*)    All other components within normal limits  CBG MONITORING, ED - Abnormal; Notable for the following components:   Glucose-Capillary 107 (*)    All other components within normal limits  PARATHYROID HORMONE, INTACT (NO CA)    EKG None  Radiology No results found.  Procedures Procedures   Medications Ordered in ED Medications  heparin injection 2,000 Units (2,000 Units Dialysis Given 05/23/23 0865)    ED Course/ Medical Decision Making/ A&P                           Medical Decision Making Amount and/or Complexity of Data  Reviewed Labs: ordered.    Problem List / ED Course:  Patient with past history significant for DM, HTN, COPD, CHF, and ESRD presents to the ED for routine HD. Patient states that he will not be allowed to return to dialysis centers due to behavioral concerns. No acute changes since last in the ED. Tuesday, Thursday, Saturday dialysis sessions for ESRD. Will send page out to nephrology to have patient  brought in for hospital HD. Labs ordered: CBC, renal function panel. Spoke with Dr. Arlean Hopping, nephrology, who will place orders for hospital HD of patient. Patient had hospital HD performed and returned to the ED floor without complications. Patient was deaccesed without any bleeding complications. Safe and stable for discharge home at this time. All questions answered prior to patient discharge.   Social Determinants of Health:  ESRD Not established with dialysis center at this time  Final Clinical Impression(s) / ED Diagnoses Final diagnoses:  ESRD (end stage renal disease) (HCC)  Encounter for hemodialysis [facility])    Rx / DC Orders ED Discharge Orders     None         Salomon Mast 05/23/23 2138    Rolan Bucco, MD 05/25/23 1456

## 2023-05-23 NOTE — Procedures (Signed)
We are asked to see this patient for hospital dialysis. Pt was discharged from his OP HD unit in December 2023 due to behavioral issues. Pt came to ED today requesting hospital dialysis. Plan is for HD upstairs then will return to ED for reassessment and expected discharge home.      Last OP HD orders (dec 2023):  3:15h  129kg  RUE AVG   Heparin 2000    - last hep B labs here were - neg Hep B SAg from 05/06/23         - + hep B S Ab's (32.4) from 04/07/23    - pt typically comes on Tu, Th and Sat for hospital HD   Anemia of esrd:  Tsat was low on 5/14 at 11% --> pharm suggested ferric gluconate IV 1 gm. We gave him 1 gm load over 4 HD sessions as below.    Pt had darbe in late April and in early May, but Hb continued to drop, so dosing of darbepoeitin was increased up to 100-  150 mcg.   Will try to continue darbepoeitin 100- 150 mcg weekly or at least biweekly.    The darbepoeitin needs to ordered for this patient as "once in dialysis" and not "SQ at 1800" because he needs to be ready to leave at 5 pm to catch his ride home.    HD access: Pt had new LUA AVG and LIJ TDC placed on 04/07/23 by Dr Edilia Bo. Will consult [facility] to see if they can dc his TDC today.       Date             Hb         Tsat/ ferritin    Darbe SQ       ferric gluconate 12/02/22            11.0       22%/ 935 01/24/23           9.0        31%/ 1056   03/16/23            8.5                             60 mcg 03/30/23            8.0                             60 mcg  04/11/23            8.1        11%/ 545      100 mcg 5/18                                                                             250mg  #1 04/24/23             8.0                              60 mcg  04/27/23  7.3                                                      250mg  #2 04/29/23                                                100 mcg           250mg  #3       6/06                 7.5                               150 mcg 6/08                  7.9                                                        250mg  #4 05/11/23            8.1                               05/18/23            8.2                                150 mcg          250mg         I was present at this dialysis session, have reviewed the session and made  appropriate changes Vinson Moselle MD  [facility] 05/23/2023, 12:51 PM

## 2023-05-23 NOTE — ED Notes (Signed)
Patient transported to dialysis

## 2023-05-23 NOTE — Progress Notes (Signed)
   05/23/23 1250  Vitals  Temp 98 F (36.7 C)  Pulse Rate 80  Resp 16  BP (!) 172/82  SpO2 100 %  O2 Device Nasal Cannula  Weight  (no weight--Per last weight note)  Oxygen Therapy  O2 Flow Rate (L/min) 3 L/min  Patient Activity (if Appropriate) In bed  Pulse Oximetry Type Continuous  Post Treatment  Dialyzer Clearance Lightly streaked  Duration of HD Treatment -hour(s) 3.15 hour(s)  Hemodialysis Intake (mL) 0 mL  Liters Processed 78  Fluid Removed (mL) 3000 mL (mLs)  Tolerated HD Treatment Yes  Post-Hemodialysis Comments pt tolerated tx well. No complaints or concerns ATM. Vitals stable. Fluid goal was removed.   Received patient in bed to unit.  Alert and oriented.  Informed consent signed and in chart.   TX duration:3.25hrs  Patient tolerated well.  Transported back to the room  Alert, without acute distress.  Hand-off given to patient's nurse.   Access used: LAVG Access issues:  none  Total UF removed: 3L Medication(s) given: none    [facility]

## 2023-05-23 NOTE — Discharge Instructions (Addendum)
You were seen in the emergency department for dialysis. You were placed into a session without complications. Your labs are typical for your self at this time. Unless you are able to get into a dialysis facility, return to the ER for further dialysis sessions.

## 2023-05-23 NOTE — Progress Notes (Signed)
  Procedure Note   PRE-OPERATIVE DIAGNOSIS:   ESRD.   POST-OPERATIVE DIAGNOSIS:  ESRD.  PROCEDURE:  Removal of left IJ perm cath.   PROVIDER:   Emilie Rutter PA-C.  ANESTHESIA:  Local  The risks and benefits of this procedure were explained to the patient and the patient consented.   OPERATIVE PROCEDURE:  The following procedure was performed at the bedside.  The left side of patient's neck and chest was prepped and draped in standard fashion.  Local anesthesia was infiltrated over the tunneled catheter and its cuff.  The cuff was unable to be freed with blunt dissection from catheter exit site.  An incision was then made over the cuff.  The cuff was then dissected free using blunt and sharp dissection.  The catheter was removed in its entirety.  The incision was closed with #2 horizontal mattress sutures using 3-0 nylon.   Hemostasis was achieved with local compression.    The patient tolerated the procedure well and did not have any complications.  RECOMMENDATIONS:   suture removal in 2-3 weeks.    Emilie Rutter, PA-C [facility] [phone] 05/23/2023  3:02 PM

## 2023-05-23 NOTE — ED Triage Notes (Signed)
Pt presents for routine dialysis.  Pt has not missed any treatments.  Denies complaints.

## 2023-05-24 LAB — PARATHYROID HORMONE, INTACT (NO CA): PTH: 187 pg/mL — ABNORMAL HIGH (ref 15–65)

## 2023-05-27 ENCOUNTER — Emergency Department (HOSPITAL_COMMUNITY)
Admission: EM | Admit: 2023-05-27 | Discharge: 2023-05-27 | Disposition: A | Discharge status: Home / Self Care | Payer: 59 | Attending: Emergency Medicine | Admitting: Emergency Medicine

## 2023-05-27 ENCOUNTER — Encounter (HOSPITAL_COMMUNITY): Payer: Self-pay | Admitting: *Deleted

## 2023-05-27 ENCOUNTER — Other Ambulatory Visit: Payer: Self-pay

## 2023-05-27 DIAGNOSIS — N186 End stage renal disease: Secondary | ICD-10-CM | POA: Insufficient documentation

## 2023-05-27 DIAGNOSIS — I509 Heart failure, unspecified: Secondary | ICD-10-CM | POA: Insufficient documentation

## 2023-05-27 DIAGNOSIS — J449 Chronic obstructive pulmonary disease, unspecified: Secondary | ICD-10-CM | POA: Insufficient documentation

## 2023-05-27 DIAGNOSIS — Z7982 Long term (current) use of aspirin: Secondary | ICD-10-CM | POA: Insufficient documentation

## 2023-05-27 DIAGNOSIS — I132 Hypertensive heart and chronic kidney disease with heart failure and with stage 5 chronic kidney disease, or end stage renal disease: Secondary | ICD-10-CM | POA: Diagnosis not present

## 2023-05-27 DIAGNOSIS — Z96653 Presence of artificial knee joint, bilateral: Secondary | ICD-10-CM | POA: Diagnosis not present

## 2023-05-27 DIAGNOSIS — Z992 Dependence on renal dialysis: Secondary | ICD-10-CM | POA: Diagnosis not present

## 2023-05-27 DIAGNOSIS — Z79899 Other long term (current) drug therapy: Secondary | ICD-10-CM | POA: Diagnosis not present

## 2023-05-27 DIAGNOSIS — E1122 Type 2 diabetes mellitus with diabetic chronic kidney disease: Secondary | ICD-10-CM | POA: Insufficient documentation

## 2023-05-27 DIAGNOSIS — Z794 Long term (current) use of insulin: Secondary | ICD-10-CM | POA: Diagnosis not present

## 2023-05-27 LAB — RENAL FUNCTION PANEL
Albumin: 3.5 g/dL (ref 3.5–5.0)
Anion gap: 13 (ref 5–15)
BUN: 68 mg/dL — ABNORMAL HIGH (ref 8–23)
CO2: 27 mmol/L (ref 22–32)
Calcium: 8.1 mg/dL — ABNORMAL LOW (ref 8.9–10.3)
Chloride: 95 mmol/L — ABNORMAL LOW (ref 98–111)
Creatinine, Ser: 5.19 mg/dL — ABNORMAL HIGH (ref 0.61–1.24)
GFR, Estimated: 12 mL/min — ABNORMAL LOW (ref >60)
Glucose, Bld: 149 mg/dL — ABNORMAL HIGH (ref 70–99)
Phosphorus: 4.1 mg/dL (ref 2.5–4.6)
Potassium: 4 mmol/L (ref 3.5–5.1)
Sodium: 135 mmol/L (ref 135–145)

## 2023-05-27 LAB — CBC
HCT: 28.5 % — ABNORMAL LOW (ref 39.0–52.0)
Hemoglobin: 8.8 g/dL — ABNORMAL LOW (ref 13.0–17.0)
MCH: 27.7 pg (ref 26.0–34.0)
MCHC: 30.9 g/dL (ref 30.0–36.0)
MCV: 89.6 fL (ref 80.0–100.0)
Platelets: 157 10*3/uL (ref 150–400)
RBC: 3.18 MIL/uL — ABNORMAL LOW (ref 4.22–5.81)
RDW: 15 % (ref 11.5–15.5)
WBC: 6.4 10*3/uL (ref 4.0–10.5)
nRBC: 0 % (ref 0.0–0.2)

## 2023-05-27 MED ORDER — CHLORHEXIDINE GLUCONATE CLOTH 2 % EX PADS: 6.0000 | MEDICATED_PAD | Freq: Every day | CUTANEOUS | Status: DC

## 2023-05-27 MED ORDER — HEPARIN SODIUM (PORCINE) 1000 UNIT/ML IJ SOLN
INTRAMUSCULAR | Status: AC
  Filled 2023-05-27: qty 2

## 2023-05-27 MED ORDER — HEPARIN SODIUM (PORCINE) 1000 UNIT/ML DIALYSIS
2000.0000 [IU] | Freq: Once | INTRAMUSCULAR | Status: AC
  Administered 2023-05-27: 2000 [IU] via INTRAVENOUS_CENTRAL

## 2023-05-27 MED ORDER — DARBEPOETIN ALFA 100 MCG/0.5ML IJ SOSY
100.0000 ug | PREFILLED_SYRINGE | Freq: Once | INTRAMUSCULAR | Status: AC
  Administered 2023-05-27: 100 ug via SUBCUTANEOUS
  Filled 2023-05-27: qty 0.5

## 2023-05-27 NOTE — ED Triage Notes (Signed)
Patient is here for his routine dialysis, no other complaints.

## 2023-05-27 NOTE — ED Notes (Signed)
Pt left for dialysis in stable condition, AOX4, with his belongings and staff.

## 2023-05-27 NOTE — Procedures (Signed)
We were asked to see this patient for dialysis. Pt was discharged from his OP HD unit in December 2023 due to behavioral issues. He no longer has a home outpatient HD unit. Pt came to ED today requesting hospital dialysis. Plan is for HD upstairs then will return to ED for reassessment and expected discharge home.      Last OP HD orders (dec 2023):  3:15h  129kg  RUE AVG   Heparin 2000     - last hep B labs here were - neg Hep B SAg from 05/06/23         - + hep B S Ab's (32.4) from 04/07/23    - pt typically comes on Tu, Th and Sat for hospital HD   Anemia of esrd:     Pt had darbe in late April and in early May, but Hb continued to drop, so dosing of darbepoeitin was increased up to 100-  150 mcg.  Tsat was low on 5/14 at 11% --> pharm suggested ferric gluconate IV 1- gm over 4- 8 sessions. We gave him 1 gm load over 4 HD sessions as below.   Hb has been trending up in mid June, as shown in the chart below   Will attempt to continue darbepoeitin 100- 150 mcg weekly or at least biweekly.    The darbepoeitin needs to ordered for this patient as "once in dialysis" and not "SQ at 1800" because he needs to be ready to leave at 5 pm to catch his ride home.    HD access: Pt had new LUA AVG and LIJ TDC placed on 04/07/23 by Dr Edilia Bo. TDC was removed on 05/23/23. Using AVG now.       Date             Hb         Tsat/ ferritin    Darbe SQ       ferric gluconate 12/02/22            11.0       22%/ 935 01/24/23           9.0        31%/ 1056   03/16/23            8.5                             60 mcg 03/30/23            8.0                             60 mcg  04/11/23            8.1        11%/ 545      100 mcg 5/18                                                                             250mg  #1 04/24/23             8.0  60 mcg  04/27/23             7.3                                                      250mg  #2 04/29/23                                                 100 mcg           250mg  #3       6/06                 7.5                               150 mcg 6/08                 7.9                                                        250mg  #4 05/11/23            8.1                               05/18/23            8.2                                150 mcg          250mg   05/23/23 8.8     100 mcg ordered  Vinson Moselle  MD  [facility] 05/27/2023, 12:32 PM    Recent Labs  Lab 05/23/23 0748 05/23/23 0940 05/27/23 1057 05/27/23 1058  HGB  --  8.8* 8.8*  --   ALBUMIN 3.3*  --   --  3.5  CALCIUM 8.0*  --   --  8.1*  PHOS 4.3  --   --  4.1  CREATININE 4.45*  --   --  5.19*  K 3.9  --   --  4.0    Inpatient medications:  Chlorhexidine Gluconate Cloth  6 each Topical Q0600   darbepoetin (ARANESP) injection - DIALYSIS  100 mcg Subcutaneous Once in dialysis   heparin  2,000 Units Dialysis Once in dialysis   heparin sodium (porcine)        heparin sodium (porcine)

## 2023-05-27 NOTE — ED Provider Notes (Signed)
Medical Decision Making: Care of patient assumed at 3:09 PM.  Agree with history.  See their note for further details.  Briefly, 62 y.o. male with PMH/PSH as below.  Past Medical History:  Diagnosis Date   Altered mental status 05/04/2021   Arthritis    Blood transfusion without reported diagnosis    CHF (congestive heart failure) (HCC)    COPD (chronic obstructive pulmonary disease) (HCC)    Depression    Diabetes mellitus without complication (HCC)    type 2   Dyspnea    ESRD on hemodialysis (HCC)    Tues Thurs Sat   GERD (gastroesophageal reflux disease)    protonix   Heart murmur    as a child, no problems   Hypertension    Myocardial infarction (HCC)    PTSD (post-traumatic stress disorder)    Sepsis (HCC)    Sleep apnea    occasional uses CPAP   Uses powered wheelchair    Past Surgical History:  Procedure Laterality Date   A/V FISTULAGRAM N/A 08/30/2021   Procedure: A/V FISTULAGRAM;  Surgeon: Maeola Harman, MD;  Location: [facility] INVASIVE CV LAB;  Service: Cardiovascular;  Laterality: N/A;   A/V FISTULAGRAM Right 07/28/2022   Procedure: A/V Fistulagram;  Surgeon: Maeola Harman, MD;  Location: [facility] INVASIVE CV LAB;  Service: Cardiovascular;  Laterality: Right;   A/V FISTULAGRAM Right 09/05/2022   Procedure: A/V Fistulagram;  Surgeon: Maeola Harman, MD;  Location: [facility] INVASIVE CV LAB;  Service: Cardiovascular;  Laterality: Right;   A/V FISTULAGRAM Right 10/31/2022   Procedure: A/V Fistulagram;  Surgeon: Maeola Harman, MD;  Location: [facility] INVASIVE CV LAB;  Service: Cardiovascular;  Laterality: Right;   A/V SHUNT INTERVENTION Right 10/31/2022   Procedure: A/V SHUNT INTERVENTION;  Surgeon: Maeola Harman, MD;  Location: [facility] INVASIVE CV LAB;  Service: Cardiovascular;  Laterality: Right;   A/V SHUNTOGRAM N/A 11/08/2021   Procedure: A/V SHUNTOGRAM;  Surgeon: Maeola Harman, MD;  Location: [facility] INVASIVE CV LAB;  Service:  Cardiovascular;  Laterality: N/A;   AMPUTATION Left 04/03/2021   Procedure: LEFT ABOVE-THE-KNEE AMPUTATION;  Surgeon: Terance Hart, MD;  Location: [facility] OR;  Service: Orthopedics;  Laterality: Left;   AV FISTULA PLACEMENT Right 08/12/2021   Procedure: INSERTION OF ARTERIOVENOUS (AV) GORE-TEX GRAFT RIGHT ARM;  Surgeon: Nada Libman, MD;  Location: [facility] OR;  Service: Vascular;  Laterality: Right;   AV FISTULA PLACEMENT Left 04/07/2023   Procedure: ARTERIOVENOUS (AV) FISTULA GRAFT USING GORETEX STRETCH (4-7MM);  Surgeon: Chuck Hint, MD;  Location: [facility] OR;  Service: Vascular;  Laterality: Left;   INSERTION OF DIALYSIS CATHETER N/A 11/25/2022   Procedure: INSERTION OF DIALYSIS CATHETER;  Surgeon: Lorin Glass, MD;  Location: [facility] ENDOSCOPY;  Service: Pulmonary;  Laterality: N/A;   INSERTION OF DIALYSIS CATHETER N/A 03/03/2023   Procedure: INSERTION OF TUNNELED DIALYSIS CATHETER;  Surgeon: Maeola Harman, MD;  Location: [facility] OR;  Service: Vascular;  Laterality: N/A;   INSERTION OF DIALYSIS CATHETER Left 04/07/2023   Procedure: INSERTION OF DIALYSIS CATHETER USING PALINDROME  28CM PRECISION CHRONIC CATHETER KIT;  Surgeon: Chuck Hint, MD;  Location: [facility] OR;  Service: Vascular;  Laterality: Left;   IR REMOVAL TUN CV CATH W/O FL  03/30/2021   JOINT REPLACEMENT     Bilateral knees   LIGATION ARTERIOVENOUS GORTEX GRAFT Right 03/03/2023   Procedure: LIGATION RIGHT ARM ARTERIOVENOUS GORTEX GRAFT;  Surgeon: Maeola Harman, MD;  Location: [facility] OR;  Service: Vascular;  Laterality:  Right;   PERIPHERAL VASCULAR BALLOON ANGIOPLASTY Right 08/30/2021   Procedure: PERIPHERAL VASCULAR BALLOON ANGIOPLASTY;  Surgeon: Maeola Harman, MD;  Location: [facility] INVASIVE CV LAB;  Service: Cardiovascular;  Laterality: Right;   PERIPHERAL VASCULAR BALLOON ANGIOPLASTY Right 07/28/2022   Procedure: PERIPHERAL VASCULAR BALLOON ANGIOPLASTY;  Surgeon: Maeola Harman, MD;  Location:  [facility] INVASIVE CV LAB;  Service: Cardiovascular;  Laterality: Right;  AVF   PERIPHERAL VASCULAR INTERVENTION  11/08/2021   Procedure: PERIPHERAL VASCULAR INTERVENTION;  Surgeon: Maeola Harman, MD;  Location: [facility] INVASIVE CV LAB;  Service: Cardiovascular;;  Central rt arm fistula   UPPER EXTREMITY VENOGRAPHY Right 08/10/2021   Procedure: UPPER EXTREMITY VENOGRAPHY;  Surgeon: Nada Libman, MD;  Location: [facility] INVASIVE CV LAB;  Service: Cardiovascular;  Laterality: Right;      Patient presents for routine dialysis.  Seen by prior team please see their note for further details.  Status post dialysis session.  MDM:  -Reviewed and confirmed nursing documentation for past medical history, family history, social history. -When I went to evaluate the patient, he had already eloped from the emergency department.  Notes reviewed and it appears he has completed his routine dialysis session.    The plan for this patient was discussed with Dr. Rush Landmark, who voiced agreement and who oversaw evaluation and treatment of this patient.  Marta Lamas, MD Emergency Medicine, PGY-3  Note: Dragon medical dictation software was used in the creation of this note.      Chase Caller, MD 05/27/23 1522    Tegeler, Canary Brim, MD 05/27/23 2232

## 2023-05-27 NOTE — ED Provider Notes (Signed)
[place] EMERGENCY DEPARTMENT AT [name] Provider Note   CSN: [id number] Arrival date & time: 05/27/23  4098     History  Chief Complaint  Patient presents with   Vascular Access Problem    [name] is a 62 y.o. male with a PMHx of ESRD on dialysis (TThSa) who presents to the ED with concerns for routine dialysis.  His last dialysis treatment was on Tuesday, 05/23/2023.  Notes that he missed his dialysis treatment on 05/25/2023 due to not feeling up to it at the time.  Denies chest pain, shortness of breath, abdominal pain, nausea, vomiting, swelling.  The history is provided by the patient. No language interpreter was used.       Home Medications Prior to Admission medications   Medication Sig Start Date End Date Taking? Authorizing Provider  amLODipine (NORVASC) 10 MG tablet Take 10 mg by mouth every morning. 04/17/20   [provider]  ASPIRIN LOW DOSE 81 MG EC tablet Take 81 mg by mouth in the morning. 02/22/21   [provider]  atorvastatin (LIPITOR) 40 MG tablet Take 40 mg by mouth in the morning. 02/09/21   [provider]  calcium acetate (PHOSLO) 667 MG capsule Take 2 capsules (1,334 mg total) by mouth with breakfast, with lunch, and with evening meal. 04/10/21   Calvert Cantor, MD  Calcium Carbonate Antacid (CALCIUM CARBONATE, DOSED IN MG ELEMENTAL CALCIUM,) 1250 MG/5ML SUSP Take 5 mLs (500 mg of elemental calcium total) by mouth every 6 (six) hours as needed for indigestion. 04/08/23   Rhetta Mura, MD  carvedilol (COREG) 12.5 MG tablet Take 12.5 mg by mouth 2 (two) times daily with a meal. 04/17/20   [provider]  Cholecalciferol (VITAMIN D) 50 MCG (2000 UT) CAPS Take 2,000 Units by mouth daily.    [provider]  clopidogrel (PLAVIX) 75 MG tablet Take 1 tablet (75 mg total) by mouth daily. 11/02/22   Rhyne, Ames Coupe, PA-C  cyclobenzaprine (FLEXERIL) 10 MG tablet Take 10 mg by mouth 3 (three) times  daily as needed for muscle spasms. 12/29/21   [provider]  diazepam (VALIUM) 5 MG tablet Take 5 mg by mouth 2 (two) times daily as needed for muscle spasms. 12/09/21   [provider]  insulin regular human CONCENTRATED (HUMULIN R U-500 KWIKPEN) 500 UNIT/ML KwikPen Inject 45 Units into the skin daily with supper. Patient taking differently: Inject 70-125 Units into the skin 2 (two) times daily with a meal. 09/04/21 08/30/24  [name], MD  metolazone (ZAROXOLYN) 10 MG tablet Take 10 mg by mouth daily. 07/13/21   [provider]  nortriptyline (PAMELOR) 10 MG capsule Take 20 mg by mouth at bedtime. 10/23/19 08/30/24  [provider]  oxyCODONE (ROXICODONE) 5 MG immediate release tablet Take 1 tablet (5 mg total) by mouth every 8 (eight) hours as needed. Patient taking differently: Take 5 mg by mouth every 8 (eight) hours as needed for moderate pain. 12/31/22 12/31/23  [name], [name], DO  pantoprazole (PROTONIX) 40 MG tablet Take 40 mg by mouth daily before breakfast. 02/09/21   [provider]  pregabalin (LYRICA) 75 MG capsule Take 1 capsule (75 mg total) by mouth See admin instructions. Daily.  Give after dialysis on dialysis days. 01/21/23   [name], MD  sertraline (ZOLOFT) 100 MG tablet Take 100 mg by mouth in the morning. 02/22/21   [provider]  tamsulosin (FLOMAX) 0.4 MG CAPS capsule Take 1  capsule (0.4 mg total) by mouth daily. 11/10/21   [name], MD  torsemide (DEMADEX) 20 MG tablet Take 120 mg by mouth 2 (two) times daily.    [provider]  traZODone (DESYREL) 150 MG tablet Take 150 mg by mouth at bedtime. 12/09/21   [provider]      Allergies    Actos [pioglitazone], Dexmedetomidine, Ibuprofen, Tomato, and Wellbutrin [bupropion]    Review of Systems   Review of Systems  All other systems reviewed and are negative.   Physical Exam Updated Vital Signs BP 97/65 (BP Location: Right Arm)   Pulse  67   Temp 98 F (36.7 C)   Resp (!) 22   Ht 5\' 5"  (1.651 m)   Wt 45.8 kg   SpO2 98%   BMI 16.80 kg/m  Physical Exam Vitals and nursing note reviewed.  Constitutional:      General: He is not in acute distress.    Appearance: Normal appearance.  Eyes:     General: No scleral icterus.    Extraocular Movements: Extraocular movements intact.  Cardiovascular:     Rate and Rhythm: Normal rate.  Pulmonary:     Effort: Pulmonary effort is normal. No respiratory distress.  Abdominal:     Palpations: Abdomen is soft. There is no mass.     Tenderness: There is no abdominal tenderness.  Musculoskeletal:        General: Normal range of motion.     Cervical back: Neck supple.  Skin:    General: Skin is warm and dry.     Findings: No rash.  Neurological:     Mental Status: He is alert.     Sensory: Sensation is intact.     Motor: Motor function is intact.  Psychiatric:        Behavior: Behavior normal.     ED Results / Procedures / Treatments   Labs (all labs ordered are listed, but only abnormal results are displayed) Labs Reviewed  CBC  RENAL FUNCTION PANEL    EKG None  Radiology No results found.  Procedures Procedures    Medications Ordered in ED Medications  Chlorhexidine Gluconate Cloth 2 % PADS 6 each (has no administration in time range)  heparin injection 2,000 Units (has no administration in time range)  heparin sodium (porcine) 1000 UNIT/ML injection (has no administration in time range)    ED Course/ Medical Decision Making/ A&P                             Medical Decision Making  Pt presents with concerns for need for dialysis.  Has dialysis on Tuesday, Thursday, Saturday, last treatment was on 05/23/2023.  Patient afebrile. On exam, pt with no acute cardiovascular, respiratory, abdominal exam findings.   Co morbidities that complicate the patient evaluation: ESRD (Dialysis TThSa), type 2 DM, HTN  Additional history obtained:  External records  from outside source obtained and reviewed including: Patient was evaluated emergency department on 05/23/2023 for routine dialysis treatment  Consultations: I requested consultation with the Nephrologist, Dr. Arlean Hopping, and discussed lab and imaging findings as well as pertinent plan - they recommend: HD  Disposition: Patient to dialysis from the ED.  Labs to be obtained in dialysis. Pt to receive dialysis treatment as per Nephrologist, Dr. Arlean Hopping    This chart was dictated using voice recognition software, Dragon. Despite the best efforts of this provider to proofread and correct errors, errors may  still occur which can change documentation meaning.   Final Clinical Impression(s) / ED Diagnoses Final diagnoses:  Encounter for dialysis [facility])    Rx / DC Orders ED Discharge Orders     None         Zamorah Ailes A, PA-C 05/27/23 1038    Terald Sleeper, MD 05/27/23 1940

## 2023-05-27 NOTE — Progress Notes (Addendum)
Received patient in E.D bed.Awake,alert and oriented x 4.He signed his own treatment consent.  Access used ; Left upper arm AVG that worked well.   Medicines given : Heparin 2,000 units pre-run bolus.                              Aranesp 100 mcg  subcutaneous.  Duration of treatment : 3.15 hours instead of 3.5 hours prescribed.   Fluid removed :Achieved prescribed UF goal of 3.3 liters.  Hemo comment:Patient refused to go on 3.5 hours treatment as prescribed,instead he opted for 3.15 hours. He tolerated his treatment well.No episodes of cramping today.  Hand off to the patient's nurse.

## 2023-05-31 ENCOUNTER — Emergency Department (HOSPITAL_COMMUNITY): Payer: 59

## 2023-05-31 ENCOUNTER — Inpatient Hospital Stay (HOSPITAL_COMMUNITY)
Admission: EM | Admit: 2023-05-31 | Discharge: 2023-06-04 | DRG: 682 | Disposition: A | Discharge status: Home / Self Care | Payer: 59 | Attending: Family Medicine | Admitting: Family Medicine

## 2023-05-31 ENCOUNTER — Encounter (HOSPITAL_COMMUNITY): Payer: Self-pay

## 2023-05-31 ENCOUNTER — Other Ambulatory Visit: Payer: Self-pay

## 2023-05-31 DIAGNOSIS — E875 Hyperkalemia: Secondary | ICD-10-CM | POA: Diagnosis present

## 2023-05-31 DIAGNOSIS — Z888 Allergy status to other drugs, medicaments and biological substances status: Secondary | ICD-10-CM

## 2023-05-31 DIAGNOSIS — D649 Anemia, unspecified: Secondary | ICD-10-CM | POA: Diagnosis present

## 2023-05-31 DIAGNOSIS — R051 Acute cough: Secondary | ICD-10-CM | POA: Diagnosis present

## 2023-05-31 DIAGNOSIS — Z91018 Allergy to other foods: Secondary | ICD-10-CM

## 2023-05-31 DIAGNOSIS — D631 Anemia in chronic kidney disease: Secondary | ICD-10-CM | POA: Diagnosis present

## 2023-05-31 DIAGNOSIS — Z886 Allergy status to analgesic agent status: Secondary | ICD-10-CM

## 2023-05-31 DIAGNOSIS — R0602 Shortness of breath: Secondary | ICD-10-CM | POA: Diagnosis not present

## 2023-05-31 DIAGNOSIS — E1122 Type 2 diabetes mellitus with diabetic chronic kidney disease: Secondary | ICD-10-CM | POA: Diagnosis present

## 2023-05-31 DIAGNOSIS — I120 Hypertensive chronic kidney disease with stage 5 chronic kidney disease or end stage renal disease: Principal | ICD-10-CM | POA: Diagnosis present

## 2023-05-31 DIAGNOSIS — E1151 Type 2 diabetes mellitus with diabetic peripheral angiopathy without gangrene: Secondary | ICD-10-CM | POA: Diagnosis present

## 2023-05-31 DIAGNOSIS — Z89612 Acquired absence of left leg above knee: Secondary | ICD-10-CM

## 2023-05-31 DIAGNOSIS — E1142 Type 2 diabetes mellitus with diabetic polyneuropathy: Secondary | ICD-10-CM | POA: Diagnosis present

## 2023-05-31 DIAGNOSIS — Z7902 Long term (current) use of antithrombotics/antiplatelets: Secondary | ICD-10-CM

## 2023-05-31 DIAGNOSIS — F32A Depression, unspecified: Secondary | ICD-10-CM | POA: Diagnosis present

## 2023-05-31 DIAGNOSIS — Z79899 Other long term (current) drug therapy: Secondary | ICD-10-CM

## 2023-05-31 DIAGNOSIS — I5020 Unspecified systolic (congestive) heart failure: Principal | ICD-10-CM

## 2023-05-31 DIAGNOSIS — E1169 Type 2 diabetes mellitus with other specified complication: Secondary | ICD-10-CM | POA: Diagnosis present

## 2023-05-31 DIAGNOSIS — Z992 Dependence on renal dialysis: Secondary | ICD-10-CM

## 2023-05-31 DIAGNOSIS — N250 Renal osteodystrophy: Secondary | ICD-10-CM | POA: Diagnosis present

## 2023-05-31 DIAGNOSIS — D638 Anemia in other chronic diseases classified elsewhere: Secondary | ICD-10-CM | POA: Diagnosis present

## 2023-05-31 DIAGNOSIS — J449 Chronic obstructive pulmonary disease, unspecified: Secondary | ICD-10-CM | POA: Diagnosis present

## 2023-05-31 DIAGNOSIS — Z87892 Personal history of anaphylaxis: Secondary | ICD-10-CM

## 2023-05-31 DIAGNOSIS — J9621 Acute and chronic respiratory failure with hypoxia: Secondary | ICD-10-CM | POA: Diagnosis not present

## 2023-05-31 DIAGNOSIS — E8770 Fluid overload, unspecified: Secondary | ICD-10-CM | POA: Diagnosis present

## 2023-05-31 DIAGNOSIS — N400 Enlarged prostate without lower urinary tract symptoms: Secondary | ICD-10-CM | POA: Diagnosis present

## 2023-05-31 DIAGNOSIS — I953 Hypotension of hemodialysis: Secondary | ICD-10-CM | POA: Diagnosis not present

## 2023-05-31 DIAGNOSIS — N186 End stage renal disease: Secondary | ICD-10-CM

## 2023-05-31 DIAGNOSIS — Z7982 Long term (current) use of aspirin: Secondary | ICD-10-CM

## 2023-05-31 DIAGNOSIS — G4733 Obstructive sleep apnea (adult) (pediatric): Secondary | ICD-10-CM | POA: Diagnosis present

## 2023-05-31 DIAGNOSIS — E785 Hyperlipidemia, unspecified: Secondary | ICD-10-CM | POA: Diagnosis present

## 2023-05-31 DIAGNOSIS — Z87891 Personal history of nicotine dependence: Secondary | ICD-10-CM

## 2023-05-31 DIAGNOSIS — E1165 Type 2 diabetes mellitus with hyperglycemia: Secondary | ICD-10-CM | POA: Diagnosis present

## 2023-05-31 DIAGNOSIS — Z91158 Patient's noncompliance with renal dialysis for other reason: Secondary | ICD-10-CM

## 2023-05-31 LAB — I-STAT CHEM 8, ED
BUN: 90 mg/dL — ABNORMAL HIGH (ref 8–23)
Calcium, Ion: 1.02 mmol/L — ABNORMAL LOW (ref 1.15–1.40)
Chloride: 97 mmol/L — ABNORMAL LOW (ref 98–111)
Creatinine, Ser: 5.2 mg/dL — ABNORMAL HIGH (ref 0.61–1.24)
Glucose, Bld: 407 mg/dL — ABNORMAL HIGH (ref 70–99)
HCT: 32 % — ABNORMAL LOW (ref 39.0–52.0)
Hemoglobin: 10.9 g/dL — ABNORMAL LOW (ref 13.0–17.0)
Potassium: 4.4 mmol/L (ref 3.5–5.1)
Sodium: 135 mmol/L (ref 135–145)
TCO2: 29 mmol/L (ref 22–32)

## 2023-05-31 LAB — HEPATITIS B SURFACE ANTIGEN: Hepatitis B Surface Ag: NONREACTIVE

## 2023-05-31 MED ORDER — HEPARIN SODIUM (PORCINE) 1000 UNIT/ML DIALYSIS: 1000.0000 [IU] | INTRAMUSCULAR | Status: DC | PRN

## 2023-05-31 MED ORDER — PENTAFLUOROPROP-TETRAFLUOROETH EX AERO: 1.0000 | INHALATION_SPRAY | CUTANEOUS | Status: DC | PRN

## 2023-05-31 MED ORDER — LIDOCAINE HCL (PF) 1 % IJ SOLN: 5.0000 mL | INTRAMUSCULAR | Status: DC | PRN

## 2023-05-31 MED ORDER — LIDOCAINE-PRILOCAINE 2.5-2.5 % EX CREA: 1.0000 | TOPICAL_CREAM | CUTANEOUS | Status: DC | PRN

## 2023-05-31 MED ORDER — CHLORHEXIDINE GLUCONATE CLOTH 2 % EX PADS: 6.0000 | MEDICATED_PAD | Freq: Every day | CUTANEOUS | Status: DC

## 2023-05-31 MED ORDER — ALTEPLASE 2 MG IJ SOLR: 2.0000 mg | Freq: Once | INTRAMUSCULAR | Status: DC | PRN

## 2023-05-31 MED ORDER — HEPARIN SODIUM (PORCINE) 1000 UNIT/ML DIALYSIS: 20.0000 [IU]/kg | INTRAMUSCULAR | Status: DC | PRN

## 2023-05-31 MED ORDER — DARBEPOETIN ALFA 100 MCG/0.5ML IJ SOSY
100.0000 ug | PREFILLED_SYRINGE | Freq: Once | INTRAMUSCULAR | Status: DC
  Filled 2023-05-31: qty 0.5

## 2023-05-31 MED ORDER — ANTICOAGULANT SODIUM CITRATE 4% (200MG/5ML) IV SOLN: 5.0000 mL | Status: DC | PRN

## 2023-05-31 MED ORDER — SODIUM CHLORIDE 0.9 % IV SOLN
250.0000 mg | Freq: Once | INTRAVENOUS | Status: DC
  Filled 2023-05-31 (×2): qty 20

## 2023-05-31 NOTE — ED Provider Notes (Signed)
[place] EMERGENCY DEPARTMENT AT [facility] Provider Note   CSN: [id number] Arrival date & time: 05/31/23  2032     History  Chief Complaint  Patient presents with   Shortness of Breath    Jeffrey [name] is a 62 y.o. male.  Patient has a history of heart failure and is a dialysis patient.  He has not had his dialysis since Saturday.  Patient complains of shortness of breath and has a pulse ox of 80 on room air.  Patient is put on a nonrebreather and his O2 sats have stabilized at 95%  The history is provided by the patient and medical records. No language interpreter was used.  Shortness of Breath Severity:  Severe Onset quality:  Gradual Duration:  4 days Timing:  Constant Progression:  Worsening Chronicity:  Recurrent Context: activity   Relieved by:  Nothing Worsened by:  Nothing Ineffective treatments:  None tried Associated symptoms: no abdominal pain, no chest pain, no cough, no headaches and no rash   Risk factors: no recent alcohol use        Home Medications Prior to Admission medications   Medication Sig Start Date End Date Taking? Authorizing Provider  amLODipine (NORVASC) 10 MG tablet Take 10 mg by mouth every morning. 04/17/20   [provider]  ASPIRIN LOW DOSE 81 MG EC tablet Take 81 mg by mouth in the morning. 02/22/21   [provider]  atorvastatin (LIPITOR) 40 MG tablet Take 40 mg by mouth in the morning. 02/09/21   [provider]  calcium acetate (PHOSLO) 667 MG capsule Take 2 capsules (1,334 mg total) by mouth with breakfast, with lunch, and with evening meal. 04/10/21   Calvert Cantor, MD  Calcium Carbonate Antacid (CALCIUM CARBONATE, DOSED IN MG ELEMENTAL CALCIUM,) 1250 MG/5ML SUSP Take 5 mLs (500 mg of elemental calcium total) by mouth every 6 (six) hours as needed for indigestion. 04/08/23   Rhetta Mura, MD  carvedilol (COREG) 12.5 MG tablet Take 12.5 mg by mouth 2 (two) times daily with a meal. 04/17/20    [provider]  Cholecalciferol (VITAMIN D) 50 MCG (2000 UT) CAPS Take 2,000 Units by mouth daily.    [provider]  clopidogrel (PLAVIX) 75 MG tablet Take 1 tablet (75 mg total) by mouth daily. 11/02/22   Rhyne, Ames Coupe, PA-C  cyclobenzaprine (FLEXERIL) 10 MG tablet Take 10 mg by mouth 3 (three) times daily as needed for muscle spasms. 12/29/21   [provider]  diazepam (VALIUM) 5 MG tablet Take 5 mg by mouth 2 (two) times daily as needed for muscle spasms. 12/09/21   [provider]  insulin regular human CONCENTRATED (HUMULIN R U-500 KWIKPEN) 500 UNIT/ML KwikPen Inject 45 Units into the skin daily with supper. Patient taking differently: Inject 70-125 Units into the skin 2 (two) times daily with a meal. 09/04/21 08/30/24  [name], MD  metolazone (ZAROXOLYN) 10 MG tablet Take 10 mg by mouth daily. 07/13/21   [provider]  nortriptyline (PAMELOR) 10 MG capsule Take 20 mg by mouth at bedtime. 10/23/19 08/30/24  [provider]  oxyCODONE (ROXICODONE) 5 MG immediate release tablet Take 1 tablet (5 mg total) by mouth every 8 (eight) hours as needed. Patient taking differently: Take 5 mg by mouth every 8 (eight) hours as needed for moderate pain. 12/31/22 12/31/23  [name], [name], DO  pantoprazole (PROTONIX) 40 MG tablet Take 40 mg by mouth daily before breakfast. 02/09/21  [provider]  pregabalin (LYRICA) 75 MG capsule Take 1 capsule (75 mg total) by mouth See admin instructions. Daily.  Give after dialysis on dialysis days. 01/21/23   [name], MD  sertraline (ZOLOFT) 100 MG tablet Take 100 mg by mouth in the morning. 02/22/21   [provider]  tamsulosin (FLOMAX) 0.4 MG CAPS capsule Take 1 capsule (0.4 mg total) by mouth daily. 11/10/21   [name], MD  torsemide (DEMADEX) 20 MG tablet Take 120 mg by mouth 2 (two) times daily.    [provider]  traZODone (DESYREL) 150 MG tablet Take 150 mg by mouth  at bedtime. 12/09/21   [provider]      Allergies    Actos [pioglitazone], Dexmedetomidine, Ibuprofen, Tomato, and Wellbutrin [bupropion]    Review of Systems   Review of Systems  Constitutional:  Negative for appetite change and fatigue.  HENT:  Negative for congestion, ear discharge and sinus pressure.   Eyes:  Negative for discharge.  Respiratory:  Positive for shortness of breath. Negative for cough.   Cardiovascular:  Negative for chest pain.  Gastrointestinal:  Negative for abdominal pain and diarrhea.  Genitourinary:  Negative for frequency and hematuria.  Musculoskeletal:  Negative for back pain.  Skin:  Negative for rash.  Neurological:  Negative for seizures and headaches.  Psychiatric/Behavioral:  Negative for hallucinations.     Physical Exam Updated Vital Signs BP 135/65 (BP Location: Right Arm)   Pulse (!) 103   Temp 98 F (36.7 C) (Oral)   Resp 18   Ht 5\' 5"  (1.651 m)   Wt (!) 141.5 kg   SpO2 98%   BMI 51.92 kg/m  Physical Exam Vitals and nursing note reviewed.  Constitutional:      Appearance: He is well-developed.  HENT:     Head: Normocephalic.     Nose: Nose normal.  Eyes:     General: No scleral icterus.    Conjunctiva/sclera: Conjunctivae normal.  Neck:     Thyroid: No thyromegaly.  Cardiovascular:     Rate and Rhythm: Regular rhythm. Tachycardia present.     Heart sounds: No murmur heard.    No friction rub. No gallop.  Pulmonary:     Effort: Respiratory distress present.     Breath sounds: No stridor. No wheezing or rales.     Comments: Patient in severe respiratory distress Chest:     Chest wall: No tenderness.  Abdominal:     General: There is no distension.     Tenderness: There is no abdominal tenderness. There is no rebound.  Musculoskeletal:        General: Normal range of motion.     Cervical back: Neck supple.     Comments: Patient has 3+ edema all the way up to his groin  Lymphadenopathy:     Cervical: No  cervical adenopathy.  Skin:    Findings: No erythema or rash.  Neurological:     Mental Status: He is alert and oriented to person, place, and time.     Motor: No abnormal muscle tone.     Coordination: Coordination normal.  Psychiatric:        Behavior: Behavior normal.     ED Results / Procedures / Treatments   Labs (all labs ordered are listed, but only abnormal results are displayed) Labs Reviewed  I-STAT CHEM 8, ED - Abnormal; Notable for the following components:      Result Value   Chloride 97 (*)  BUN 90 (*)    Creatinine, Ser 5.20 (*)    Glucose, Bld 407 (*)    Calcium, Ion 1.02 (*)    Hemoglobin 10.9 (*)    HCT 32.0 (*)    All other components within normal limits  HEPATITIS B SURFACE ANTIGEN  HEPATITIS B SURFACE ANTIBODY, QUANTITATIVE  RENAL FUNCTION PANEL  CBC    EKG EKG Interpretation Date/Time:  Wednesday May 31 2023 21:03:24 EDT Ventricular Rate:  103 PR Interval:  158 QRS Duration:  95 QT Interval:  353 QTC Calculation: 463 R Axis:   90  Text Interpretation: Sinus tachycardia Borderline right axis deviation Confirmed by Bethann Berkshire [phone]) on 05/31/2023 9:07:55 PM  Radiology DG Chest Port 1 View  Result Date: 05/31/2023 CLINICAL DATA:  Shortness of breath, epistaxis EXAM: PORTABLE CHEST 1 VIEW COMPARISON:  04/22/2023 FINDINGS: Lungs are essentially clear. Mild perihilar fullness without frank interstitial edema. No pleural effusion or pneumothorax. Right upper mediastinal vascular stent. Cardiomegaly. IMPRESSION: Cardiomegaly.  No frank interstitial edema. Electronically Signed   By: Charline Bills M.D.   On: 05/31/2023 20:55    Procedures Procedures   Medications Ordered in ED Medications  Chlorhexidine Gluconate Cloth 2 % PADS 6 each (has no administration in time range)  pentafluoroprop-tetrafluoroeth (GEBAUERS) aerosol 1 Application (has no administration in time range)  lidocaine (PF) (XYLOCAINE) 1 % injection 5 mL (has no  administration in time range)  lidocaine-prilocaine (EMLA) cream 1 Application (has no administration in time range)  heparin injection 1,000 Units (has no administration in time range)  anticoagulant sodium citrate solution 5 mL (has no administration in time range)  alteplase (CATHFLO ACTIVASE) injection 2 mg (has no administration in time range)  heparin injection 2,800 Units (has no administration in time range)  ferric gluconate (FERRLECIT) 250 mg in sodium chloride 0.9 % 250 mL IVPB (has no administration in time range)  Darbepoetin Alfa (ARANESP) injection 100 mcg (has no administration in time range)    ED Course/ Medical Decision Making/ A&P CRITICAL CARE Performed by: Bethann Berkshire Total critical care time: 35 minutes Critical care time was exclusive of separately billable procedures and treating other patients. Critical care was necessary to treat or prevent imminent or life-threatening deterioration. Critical care was time spent personally by me on the following activities: development of treatment plan with patient and/or surrogate as well as nursing, discussions with consultants, evaluation of patient's response to treatment, examination of patient, obtaining history from patient or surrogate, ordering and performing treatments and interventions, ordering and review of laboratory studies, ordering and review of radiographic studies, pulse oximetry and re-evaluation of patient's condition.  Patient in severe respiratory distress and on nonrebreather.  He missed his dialysis.  I contacted nephrology and the patient will get dialysis night, if necessary he will be admitted to medicine                          Medical Decision Making Amount and/or Complexity of Data Reviewed Radiology: ordered. ECG/medicine tests: ordered.  Risk Decision regarding hospitalization.  This patient presents to the ED for concern of shortness of breath, this involves an extensive number of treatment  options, and is a complaint that carries with it a high risk of complications and morbidity.  The differential diagnosis includes fluid overload from missed dialysis and end-stage renal disease and congestive heart failure   Co morbidities that complicate the patient evaluation  End-stage renal disease, congestive heart failure   Additional  history obtained:  Additional history obtained from patient External records from outside source obtained and reviewed including hospital records   Lab Tests:  I Ordered, and personally interpreted labs.  The pertinent results include: BUN 90, creatinine 5.2, glucose 4 7   Imaging Studies ordered:  I ordered imaging studies including chest x-ray cardiomegaly I independently visualized and interpreted imaging which showed cardiomegaly I agree with the radiologist interpretation   Cardiac Monitoring: / EKG:  The patient was maintained on a cardiac monitor.  I personally viewed and interpreted the cardiac monitored which showed an underlying rhythm of: Sinus tachycardia   Consultations Obtained:  I requested consultation with the nephrology and family practice,  and discussed lab and imaging findings as well as pertinent plan - they recommend: Nephrology will get dialysis tonight and patient will go back to the emergency department if he still hypoxic he will be admitted to the family practice service   Problem List / ED Course / Critical interventions / Medication management  Hypoxia, end-stage renal disease No medicines given Reevaluation of the patient after these medicines showed that the patient stayed the same I have reviewed the patients home medicines and have made adjustments as needed   Social Determinants of Health:  Noncompliant report dialysis   Test / Admission - Considered:  None     Congestive heart failure and end-stage renal failure.  Patient is hypoxic and will get dialysis tonight.   I spoke with nephrology  and they will arrange dialysis tonight,  Dr.Bhandari.  The nephrologist stated that we could do the dialysis tonight and discharge him home if I felt like he was stable for that.  He also stated that we could get the patient admitted to medicine overnight.  I told him the patient was on a nonrebreather and I felt like he needed to be admitted overnight.  He stated that it would be my call and I told him I thought that it would be best for the patient to be ADMITTED.  I made arrangements to get him admitted to unassigned medicine and the family practice resident called the nephrologist and they decided that the patient needed to be reevaluated after dialysis and then decide at that point to admit the patient if he was hypoxic.  Final Clinical Impression(s) / ED Diagnoses Final diagnoses:  Systolic congestive heart failure, unspecified HF chronicity (HCC)  End stage renal failure on dialysis [facility])    Rx / DC Orders ED Discharge Orders     None         Bethann Berkshire, MD 06/02/23 1337

## 2023-05-31 NOTE — Progress Notes (Signed)
Paged for admission for this ESRD patient due to hypervolemia, oxygen requirement.  Last HD session on 6/29, and then missed his most recent treatment afterward.  ED attending requesting admission for CHF exacerbation.  I did review his chest x-ray, which is not showing any frank pulmonary edema, but does have mild perihilar fullness.  Compared x-rays with prior.  I called and spoke with Dr. Ronalee Belts with nephrology regarding plan of care.  He agrees with patient completing HD session tonight, and then returning to the ER afterward.  If at that time, patient is still requiring supplemental oxygen, family medicine teaching service will be happy to admit the patient for observation overnight, and repeat HD session in the morning per nephrology.  Dr. Ronalee Belts is very familiar with this patient, and shared that the patient has to obtain his dialysis through the ER as he no longer has a permanent dialysis center outpatient.  I called and spoke with my attending as well regarding this plan of care.  Please page FMTS after HD if indication for observation.  Darral Dash, DO PGY-3 [facility]

## 2023-05-31 NOTE — ED Triage Notes (Signed)
Pt c/o SOB X 10 days with nose bleeds.  Dialysis T,T,S.  Last tx was Saturday, missed ride on Tuesday.

## 2023-05-31 NOTE — Progress Notes (Signed)
Brief nephrology note:  62 y/o male ESRD on HD, well known to me from previous visits, comes to ER for HD.  Last HD on 6/29 and then he missed treatment. Now p/w sob.  Requiring oxygen supplement in ER. Recent abs from 6/29 reviewed. HD access: Pt had new LUA AVG and LIJ TDC placed on 04/07/23 by Dr Edilia Bo. TDC was removed on 05/23/23. Using AVG now.   Plan for HD tonight. 3 hours only because of staffing shortage and try to accommodate other patient who needs HD tonight. He will go to ER after HD for re-evaluation for possible discharge vs observation in the hospital.  Anemia: order a dose of Iron and Aranesp today.  D/w HD nurse and ER provider Dr. Estell Harpin.  Arlyne Brandes Ronalee Belts, CKA

## 2023-06-01 ENCOUNTER — Observation Stay (HOSPITAL_COMMUNITY): Payer: 59

## 2023-06-01 DIAGNOSIS — R051 Acute cough: Secondary | ICD-10-CM | POA: Diagnosis present

## 2023-06-01 DIAGNOSIS — E1142 Type 2 diabetes mellitus with diabetic polyneuropathy: Secondary | ICD-10-CM | POA: Diagnosis present

## 2023-06-01 DIAGNOSIS — N250 Renal osteodystrophy: Secondary | ICD-10-CM | POA: Diagnosis present

## 2023-06-01 DIAGNOSIS — E1151 Type 2 diabetes mellitus with diabetic peripheral angiopathy without gangrene: Secondary | ICD-10-CM | POA: Diagnosis present

## 2023-06-01 DIAGNOSIS — E1122 Type 2 diabetes mellitus with diabetic chronic kidney disease: Secondary | ICD-10-CM | POA: Diagnosis present

## 2023-06-01 DIAGNOSIS — F32A Depression, unspecified: Secondary | ICD-10-CM | POA: Diagnosis present

## 2023-06-01 DIAGNOSIS — E785 Hyperlipidemia, unspecified: Secondary | ICD-10-CM | POA: Diagnosis present

## 2023-06-01 DIAGNOSIS — R0602 Shortness of breath: Secondary | ICD-10-CM | POA: Diagnosis present

## 2023-06-01 DIAGNOSIS — Z992 Dependence on renal dialysis: Secondary | ICD-10-CM | POA: Diagnosis not present

## 2023-06-01 DIAGNOSIS — I953 Hypotension of hemodialysis: Secondary | ICD-10-CM | POA: Diagnosis not present

## 2023-06-01 DIAGNOSIS — E1165 Type 2 diabetes mellitus with hyperglycemia: Secondary | ICD-10-CM | POA: Diagnosis present

## 2023-06-01 DIAGNOSIS — N186 End stage renal disease: Secondary | ICD-10-CM | POA: Diagnosis present

## 2023-06-01 DIAGNOSIS — N400 Enlarged prostate without lower urinary tract symptoms: Secondary | ICD-10-CM | POA: Diagnosis present

## 2023-06-01 DIAGNOSIS — E8770 Fluid overload, unspecified: Secondary | ICD-10-CM | POA: Diagnosis present

## 2023-06-01 DIAGNOSIS — Z7982 Long term (current) use of aspirin: Secondary | ICD-10-CM | POA: Diagnosis not present

## 2023-06-01 DIAGNOSIS — E1169 Type 2 diabetes mellitus with other specified complication: Secondary | ICD-10-CM | POA: Diagnosis present

## 2023-06-01 DIAGNOSIS — R0609 Other forms of dyspnea: Secondary | ICD-10-CM | POA: Diagnosis not present

## 2023-06-01 DIAGNOSIS — Z79899 Other long term (current) drug therapy: Secondary | ICD-10-CM | POA: Diagnosis not present

## 2023-06-01 DIAGNOSIS — D631 Anemia in chronic kidney disease: Secondary | ICD-10-CM | POA: Diagnosis present

## 2023-06-01 DIAGNOSIS — I120 Hypertensive chronic kidney disease with stage 5 chronic kidney disease or end stage renal disease: Secondary | ICD-10-CM | POA: Diagnosis present

## 2023-06-01 DIAGNOSIS — J449 Chronic obstructive pulmonary disease, unspecified: Secondary | ICD-10-CM | POA: Diagnosis present

## 2023-06-01 DIAGNOSIS — Z7902 Long term (current) use of antithrombotics/antiplatelets: Secondary | ICD-10-CM | POA: Diagnosis not present

## 2023-06-01 DIAGNOSIS — E875 Hyperkalemia: Secondary | ICD-10-CM | POA: Diagnosis present

## 2023-06-01 DIAGNOSIS — G4733 Obstructive sleep apnea (adult) (pediatric): Secondary | ICD-10-CM | POA: Diagnosis present

## 2023-06-01 DIAGNOSIS — Z89612 Acquired absence of left leg above knee: Secondary | ICD-10-CM | POA: Diagnosis not present

## 2023-06-01 DIAGNOSIS — J9621 Acute and chronic respiratory failure with hypoxia: Secondary | ICD-10-CM | POA: Diagnosis not present

## 2023-06-01 LAB — CBC WITH DIFFERENTIAL/PLATELET
Abs Immature Granulocytes: 0.09 10*3/uL — ABNORMAL HIGH (ref 0.00–0.07)
Basophils Absolute: 0 10*3/uL (ref 0.0–0.1)
Basophils Relative: 0 %
Eosinophils Absolute: 0.1 10*3/uL (ref 0.0–0.5)
Eosinophils Relative: 2 %
HCT: 27.6 % — ABNORMAL LOW (ref 39.0–52.0)
Hemoglobin: 8.4 g/dL — ABNORMAL LOW (ref 13.0–17.0)
Immature Granulocytes: 2 %
Lymphocytes Relative: 8 %
Lymphs Abs: 0.5 10*3/uL — ABNORMAL LOW (ref 0.7–4.0)
MCH: 27.6 pg (ref 26.0–34.0)
MCHC: 30.4 g/dL (ref 30.0–36.0)
MCV: 90.8 fL (ref 80.0–100.0)
Monocytes Absolute: 0.5 10*3/uL (ref 0.1–1.0)
Monocytes Relative: 8 %
Neutro Abs: 4.8 10*3/uL (ref 1.7–7.7)
Neutrophils Relative %: 80 %
Platelets: 128 10*3/uL — ABNORMAL LOW (ref 150–400)
RBC: 3.04 MIL/uL — ABNORMAL LOW (ref 4.22–5.81)
RDW: 14.7 % (ref 11.5–15.5)
WBC: 6 10*3/uL (ref 4.0–10.5)
nRBC: 0 % (ref 0.0–0.2)

## 2023-06-01 LAB — HEPATITIS PANEL, ACUTE
HCV Ab: NONREACTIVE
Hep A IgM: NONREACTIVE
Hep B C IgM: NONREACTIVE
Hepatitis B Surface Ag: NONREACTIVE

## 2023-06-01 LAB — COMPREHENSIVE METABOLIC PANEL
ALT: 14 U/L (ref 0–44)
AST: 15 U/L (ref 15–41)
Albumin: 3.7 g/dL (ref 3.5–5.0)
Alkaline Phosphatase: 94 U/L (ref 38–126)
Anion gap: 12 (ref 5–15)
BUN: 48 mg/dL — ABNORMAL HIGH (ref 8–23)
CO2: 27 mmol/L (ref 22–32)
Calcium: 7.9 mg/dL — ABNORMAL LOW (ref 8.9–10.3)
Chloride: 94 mmol/L — ABNORMAL LOW (ref 98–111)
Creatinine, Ser: 3.66 mg/dL — ABNORMAL HIGH (ref 0.61–1.24)
GFR, Estimated: 18 mL/min — ABNORMAL LOW (ref >60)
Glucose, Bld: 330 mg/dL — ABNORMAL HIGH (ref 70–99)
Potassium: 4.4 mmol/L (ref 3.5–5.1)
Sodium: 133 mmol/L — ABNORMAL LOW (ref 135–145)
Total Bilirubin: 0.4 mg/dL (ref 0.3–1.2)
Total Protein: 7.6 g/dL (ref 6.5–8.1)

## 2023-06-01 LAB — BLOOD GAS, VENOUS
Acid-Base Excess: 2.4 mmol/L — ABNORMAL HIGH (ref 0.0–2.0)
Bicarbonate: 30.2 mmol/L — ABNORMAL HIGH (ref 20.0–28.0)
Drawn by: 58560
O2 Saturation: 84.8 %
Patient temperature: 36.7
pCO2, Ven: 59 mmHg (ref 44–60)
pH, Ven: 7.31 (ref 7.25–7.43)
pO2, Ven: 51 mmHg — ABNORMAL HIGH (ref 32–45)

## 2023-06-01 LAB — ECHOCARDIOGRAM COMPLETE
AR max vel: 2.19 cm2
AV Peak grad: 12.4 mmHg
Ao pk vel: 1.76 m/s
Area-P 1/2: 4.21 cm2
Height: 65 in
MV M vel: 2.8 m/s
MV Peak grad: 31.4 mmHg
S' Lateral: 3.4 cm
Weight: 4944 oz

## 2023-06-01 LAB — RENAL FUNCTION PANEL
Albumin: 3.4 g/dL — ABNORMAL LOW (ref 3.5–5.0)
Anion gap: 18 — ABNORMAL HIGH (ref 5–15)
BUN: 82 mg/dL — ABNORMAL HIGH (ref 8–23)
CO2: 25 mmol/L (ref 22–32)
Calcium: 7.8 mg/dL — ABNORMAL LOW (ref 8.9–10.3)
Chloride: 93 mmol/L — ABNORMAL LOW (ref 98–111)
Creatinine, Ser: 4.93 mg/dL — ABNORMAL HIGH (ref 0.61–1.24)
GFR, Estimated: 13 mL/min — ABNORMAL LOW (ref >60)
Glucose, Bld: 444 mg/dL — ABNORMAL HIGH (ref 70–99)
Phosphorus: 5.1 mg/dL — ABNORMAL HIGH (ref 2.5–4.6)
Potassium: 4.7 mmol/L (ref 3.5–5.1)
Sodium: 136 mmol/L (ref 135–145)

## 2023-06-01 LAB — GLUCOSE, CAPILLARY
Glucose-Capillary: 275 mg/dL — ABNORMAL HIGH (ref 70–99)
Glucose-Capillary: 353 mg/dL — ABNORMAL HIGH (ref 70–99)
Glucose-Capillary: 338 mg/dL — ABNORMAL HIGH (ref 70–99)
Glucose-Capillary: 375 mg/dL — ABNORMAL HIGH (ref 70–99)
Glucose-Capillary: 409 mg/dL — ABNORMAL HIGH (ref 70–99)

## 2023-06-01 LAB — HEMOGLOBIN A1C
Hgb A1c MFr Bld: 9.3 % — ABNORMAL HIGH (ref 4.8–5.6)
Mean Plasma Glucose: 220.21 mg/dL

## 2023-06-01 MED ORDER — LIDOCAINE HCL (PF) 1 % IJ SOLN
5.0000 mL | INTRAMUSCULAR | Status: DC | PRN
Start: 2023-06-01 — End: 2023-06-01

## 2023-06-01 MED ORDER — HEPARIN SODIUM (PORCINE) 1000 UNIT/ML DIALYSIS
1000.0000 [IU] | INTRAMUSCULAR | Status: DC | PRN
Start: 2023-06-01 — End: 2023-06-01

## 2023-06-01 MED ORDER — LIDOCAINE-PRILOCAINE 2.5-2.5 % EX CREA: 1.0000 | TOPICAL_CREAM | CUTANEOUS | Status: DC | PRN

## 2023-06-01 MED ORDER — TRAZODONE HCL 50 MG PO TABS
150.0000 mg | ORAL_TABLET | Freq: Every day | ORAL | Status: DC
  Administered 2023-06-01 – 2023-06-03 (×3): 150 mg via ORAL
  Filled 2023-06-01 (×3): qty 3

## 2023-06-01 MED ORDER — VITAMIN D 25 MCG (1000 UNIT) PO TABS
2000.0000 [IU] | ORAL_TABLET | Freq: Every day | ORAL | Status: DC
  Administered 2023-06-01 – 2023-06-04 (×4): 2000 [IU] via ORAL
  Filled 2023-06-01 (×4): qty 2

## 2023-06-01 MED ORDER — HEPARIN SODIUM (PORCINE) 5000 UNIT/ML IJ SOLN
5000.0000 [IU] | Freq: Three times a day (TID) | INTRAMUSCULAR | Status: DC
  Administered 2023-06-01 – 2023-06-03 (×7): 5000 [IU] via SUBCUTANEOUS
  Filled 2023-06-01 (×8): qty 1

## 2023-06-01 MED ORDER — HEPARIN SODIUM (PORCINE) 1000 UNIT/ML DIALYSIS: 1000.0000 [IU] | INTRAMUSCULAR | Status: DC | PRN

## 2023-06-01 MED ORDER — AZITHROMYCIN 500 MG PO TABS: 250.0000 mg | ORAL_TABLET | Freq: Every day | ORAL | Status: DC

## 2023-06-01 MED ORDER — INSULIN GLARGINE-YFGN 100 UNIT/ML ~~LOC~~ SOLN
20.0000 [IU] | Freq: Every day | SUBCUTANEOUS | Status: DC
  Administered 2023-06-01: 20 [IU] via SUBCUTANEOUS
  Filled 2023-06-01 (×2): qty 0.2

## 2023-06-01 MED ORDER — ALTEPLASE 2 MG IJ SOLR: 2.0000 mg | Freq: Once | INTRAMUSCULAR | Status: DC | PRN

## 2023-06-01 MED ORDER — INSULIN ASPART 100 UNIT/ML IJ SOLN
0.0000 [IU] | Freq: Three times a day (TID) | INTRAMUSCULAR | Status: DC
  Administered 2023-06-01: 7 [IU] via SUBCUTANEOUS
  Administered 2023-06-01 – 2023-06-02 (×2): 9 [IU] via SUBCUTANEOUS

## 2023-06-01 MED ORDER — SODIUM CHLORIDE 0.9 % IV SOLN
100.0000 mg | INTRAVENOUS | Status: DC
  Administered 2023-06-01 – 2023-06-03 (×2): 100 mg via INTRAVENOUS
  Filled 2023-06-01 (×2): qty 5

## 2023-06-01 MED ORDER — SODIUM CHLORIDE 0.9 % IV SOLN: 100.0000 mg | INTRAVENOUS | Status: DC

## 2023-06-01 MED ORDER — CARVEDILOL 12.5 MG PO TABS
12.5000 mg | ORAL_TABLET | Freq: Two times a day (BID) | ORAL | Status: DC
  Administered 2023-06-01 – 2023-06-04 (×7): 12.5 mg via ORAL
  Filled 2023-06-01 (×8): qty 1

## 2023-06-01 MED ORDER — ANTICOAGULANT SODIUM CITRATE 4% (200MG/5ML) IV SOLN: 5.0000 mL | Status: DC | PRN

## 2023-06-01 MED ORDER — SALINE SPRAY 0.65 % NA SOLN
1.0000 | NASAL | Status: DC | PRN
  Administered 2023-06-01: 1 via NASAL
  Filled 2023-06-01: qty 44

## 2023-06-01 MED ORDER — OXYCODONE HCL 5 MG PO TABS
5.0000 mg | ORAL_TABLET | Freq: Three times a day (TID) | ORAL | Status: DC | PRN
  Administered 2023-06-01 – 2023-06-03 (×5): 5 mg via ORAL
  Filled 2023-06-01 (×5): qty 1

## 2023-06-01 MED ORDER — HEPARIN SODIUM (PORCINE) 1000 UNIT/ML DIALYSIS: 2000.0000 [IU] | INTRAMUSCULAR | Status: DC | PRN

## 2023-06-01 MED ORDER — LIDOCAINE-PRILOCAINE 2.5-2.5 % EX CREA
1.0000 | TOPICAL_CREAM | CUTANEOUS | Status: DC | PRN
Start: 2023-06-01 — End: 2023-06-01

## 2023-06-01 MED ORDER — METOLAZONE 5 MG PO TABS
10.0000 mg | ORAL_TABLET | Freq: Every day | ORAL | Status: DC
  Administered 2023-06-01 – 2023-06-04 (×4): 10 mg via ORAL
  Filled 2023-06-01 (×4): qty 2

## 2023-06-01 MED ORDER — LIDOCAINE HCL (PF) 1 % IJ SOLN: 5.0000 mL | INTRAMUSCULAR | Status: DC | PRN

## 2023-06-01 MED ORDER — HEPARIN SODIUM (PORCINE) 1000 UNIT/ML IJ SOLN
INTRAMUSCULAR | Status: AC
  Filled 2023-06-01: qty 3

## 2023-06-01 MED ORDER — PENTAFLUOROPROP-TETRAFLUOROETH EX AERO: 1.0000 | INHALATION_SPRAY | CUTANEOUS | Status: DC | PRN

## 2023-06-01 MED ORDER — SERTRALINE HCL 100 MG PO TABS
100.0000 mg | ORAL_TABLET | Freq: Every day | ORAL | Status: DC
  Administered 2023-06-01 – 2023-06-04 (×4): 100 mg via ORAL
  Filled 2023-06-01 (×4): qty 1

## 2023-06-01 MED ORDER — AMLODIPINE BESYLATE 10 MG PO TABS
10.0000 mg | ORAL_TABLET | Freq: Every morning | ORAL | Status: DC
  Administered 2023-06-01 – 2023-06-04 (×4): 10 mg via ORAL
  Filled 2023-06-01 (×4): qty 1

## 2023-06-01 MED ORDER — PREGABALIN 75 MG PO CAPS
75.0000 mg | ORAL_CAPSULE | ORAL | Status: DC
  Administered 2023-06-01 – 2023-06-03 (×2): 75 mg via ORAL
  Filled 2023-06-01 (×2): qty 1

## 2023-06-01 MED ORDER — CALCIUM CARBONATE ANTACID 1250 MG/5ML PO SUSP: 500.0000 mg | Freq: Four times a day (QID) | ORAL | Status: DC | PRN

## 2023-06-01 MED ORDER — TAMSULOSIN HCL 0.4 MG PO CAPS
0.4000 mg | ORAL_CAPSULE | Freq: Every day | ORAL | Status: DC
  Administered 2023-06-01 – 2023-06-04 (×4): 0.4 mg via ORAL
  Filled 2023-06-01 (×4): qty 1

## 2023-06-01 MED ORDER — ATORVASTATIN CALCIUM 40 MG PO TABS
40.0000 mg | ORAL_TABLET | Freq: Every day | ORAL | Status: DC
  Administered 2023-06-01 – 2023-06-04 (×4): 40 mg via ORAL
  Filled 2023-06-01 (×4): qty 1

## 2023-06-01 MED ORDER — CHLORHEXIDINE GLUCONATE CLOTH 2 % EX PADS
6.0000 | MEDICATED_PAD | Freq: Every day | CUTANEOUS | Status: DC
  Administered 2023-06-02: 6 via TOPICAL

## 2023-06-01 MED ORDER — INSULIN ASPART 100 UNIT/ML IJ SOLN
5.0000 [IU] | Freq: Once | INTRAMUSCULAR | Status: AC
  Administered 2023-06-01: 5 [IU] via SUBCUTANEOUS

## 2023-06-01 MED ORDER — DARBEPOETIN ALFA 100 MCG/0.5ML IJ SOSY
100.0000 ug | PREFILLED_SYRINGE | INTRAMUSCULAR | Status: DC
  Administered 2023-06-01: 100 ug via SUBCUTANEOUS
  Filled 2023-06-01: qty 0.5

## 2023-06-01 MED ORDER — AZITHROMYCIN 500 MG PO TABS: 500.0000 mg | ORAL_TABLET | Freq: Every day | ORAL | Status: DC

## 2023-06-01 MED ORDER — NORTRIPTYLINE HCL 10 MG PO CAPS
20.0000 mg | ORAL_CAPSULE | Freq: Every day | ORAL | Status: DC
  Administered 2023-06-01 – 2023-06-03 (×3): 20 mg via ORAL
  Filled 2023-06-01 (×4): qty 2

## 2023-06-01 MED ORDER — AZITHROMYCIN 500 MG PO TABS
500.0000 mg | ORAL_TABLET | Freq: Once | ORAL | Status: AC
  Administered 2023-06-01: 500 mg via ORAL
  Filled 2023-06-01: qty 1

## 2023-06-01 MED ORDER — DIAZEPAM 5 MG PO TABS: 5.0000 mg | ORAL_TABLET | Freq: Two times a day (BID) | ORAL | Status: DC | PRN

## 2023-06-01 MED ORDER — PREDNISONE 20 MG PO TABS
40.0000 mg | ORAL_TABLET | Freq: Every day | ORAL | Status: DC
  Administered 2023-06-01: 40 mg via ORAL
  Filled 2023-06-01: qty 2

## 2023-06-01 MED ORDER — CLOPIDOGREL BISULFATE 75 MG PO TABS
75.0000 mg | ORAL_TABLET | Freq: Every day | ORAL | Status: DC
  Administered 2023-06-01 – 2023-06-04 (×4): 75 mg via ORAL
  Filled 2023-06-01 (×4): qty 1

## 2023-06-01 MED ORDER — PENTAFLUOROPROP-TETRAFLUOROETH EX AERO
1.0000 | INHALATION_SPRAY | CUTANEOUS | Status: DC | PRN
Start: 2023-06-01 — End: 2023-06-01

## 2023-06-01 MED ORDER — CHLORHEXIDINE GLUCONATE CLOTH 2 % EX PADS: 6.0000 | MEDICATED_PAD | Freq: Every day | CUTANEOUS | Status: DC

## 2023-06-01 MED ORDER — CALCIUM ACETATE (PHOS BINDER) 667 MG PO CAPS
1334.0000 mg | ORAL_CAPSULE | Freq: Three times a day (TID) | ORAL | Status: DC
  Administered 2023-06-01 – 2023-06-04 (×11): 1334 mg via ORAL
  Filled 2023-06-01 (×12): qty 2

## 2023-06-01 MED ORDER — ASPIRIN 81 MG PO TBEC
81.0000 mg | DELAYED_RELEASE_TABLET | Freq: Every day | ORAL | Status: DC
  Administered 2023-06-01 – 2023-06-04 (×4): 81 mg via ORAL
  Filled 2023-06-01 (×4): qty 1

## 2023-06-01 MED ORDER — CHLORHEXIDINE GLUCONATE CLOTH 2 % EX PADS
6.0000 | MEDICATED_PAD | Freq: Every day | CUTANEOUS | Status: DC
  Administered 2023-06-02 – 2023-06-04 (×3): 6 via TOPICAL

## 2023-06-01 MED ORDER — HEPARIN SODIUM (PORCINE) 1000 UNIT/ML DIALYSIS
20.0000 [IU]/kg | INTRAMUSCULAR | Status: DC | PRN
  Administered 2023-06-01: 2800 [IU] via INTRAVENOUS_CENTRAL

## 2023-06-01 NOTE — ED Notes (Signed)
Pt placed on 4L [place] & maintained O2 saturation of 98% or greater; however pt reported "my nose has too many boogers to have my oxygen this way.  I keep getting bloody boogers." Pt then proceeded to remove the [place] in order to try to evacuate nasal passages.  While [place] displaced O2 saturation decreased to 84%.  Pt returned [place] in place & O2 saturation returned to 96%.

## 2023-06-01 NOTE — Assessment & Plan Note (Signed)
Hemoglobin stable at 8.4 - Management per nephrology 

## 2023-06-01 NOTE — Progress Notes (Signed)
Echocardiogram 2D Echocardiogram has been performed.  Jeffrey [name] 06/01/2023, 9:50 AM

## 2023-06-01 NOTE — Assessment & Plan Note (Signed)
CBGs elevated -CBGs 4 times daily at mealtime, and for bed -Very sensitive SSI

## 2023-06-01 NOTE — Assessment & Plan Note (Addendum)
Tolerated 3.5-hour HD session overnight. May require another HD session prior to discharge.  - Continue PhosLo - Nephrology consulted, appreciate recs

## 2023-06-01 NOTE — Progress Notes (Signed)
Received patient in bed to unit. Yes Alert and oriented. x4 Informed consent signed and in chart. yes  TX duration:3.5hrs  Patient tolerated well. Pt c/o cramps at the end of treatment ,lasted 2 minutes. Transported back to the room E/D Alert, without acute distress. No major distress. Hand-off given to patient's nurse. Melynda Keller RN  Access used: left upper fistula Access issues: none  Total UF removed: 3750 Medication(s) given: 0 Post HD VS: 138/76 pulse 87 Post HD weight: 137kg   Greer Ee [facility]

## 2023-06-01 NOTE — Evaluation (Signed)
Occupational Therapy Evaluation Patient Details Name: NICKLUS DOCKENDORF MRN: [id number] DOB: 1960/12/02 Today's Date: 06/01/2023   History of Present Illness Pt is a 62 y/o male presenting with SOB likely due to hypervolemia in setting of missed HD. PMH: ESRD on HD, L AKA, CHF, HTN, HLD, BPH, depression, COPD w/ baseline 3 L O2, DM.   Clinical Impression   PTA, pt reports typically Modified Independent with transfers to/from power chair. Pt has aide assist 4 days/wk to assist with bathing/dressing and uses BSC at baseline as bathroom not w/c accessible at home. Pt resistant to OOB assessment due to discomfort s/p HD and reported difficulty breathing (93% on 5 L O2). Pt able to demo minimal UB ADLs with Setup assist but declined further LB ADL assessments. Pt denies any concerns w/ transfers but OT would like to assess acutely to ensure safety on return home. Anticipate quick improvements as pt returns to normal HD schedule.      Recommendations for follow up therapy are one component of a multi-disciplinary discharge planning process, led by the attending physician.  Recommendations may be updated based on patient status, additional functional criteria and insurance authorization.   Assistance Recommended at Discharge Intermittent Supervision/Assistance  Patient can return home with the following A little help with walking and/or transfers;A lot of help with bathing/dressing/bathroom    Functional Status Assessment  Patient has had a recent decline in their functional status and demonstrates the ability to make significant improvements in function in a reasonable and predictable amount of time.  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; 3 L O2 baseline Restrictions Weight Bearing Restrictions: No Other Position/Activity  Restrictions: L AKA      Mobility Bed Mobility               General bed mobility comments: able to long sit for UB ADL, declined EOB d/t scrotal pain    Transfers                   General transfer comment: declined      Balance                                           ADL either performed or assessed with clinical judgement   ADL Overall ADL's : Needs assistance/impaired Eating/Feeding: Independent   Grooming: Set up;Bed level   Upper Body Bathing: Set up;Bed level   Lower Body Bathing: Moderate assistance;Bed level   Upper Body Dressing : Set up;Bed level Upper Body Dressing Details (indicate cue type and reason): with smaller gown on entry; requesting larger gown and able to change bed level without assist Lower Body Dressing: Maximal assistance;Bed level       Toileting- Clothing Manipulation and Hygiene: Maximal assistance;Bed level         General ADL Comments: Self limiting, citing unable to breathe with stopped up nose (discussed with nursing who will try to get nasal spray for pt) and cramping sensation in BLE/scrotum s/p HD overnight. Pt reports no concerns regarding ability to transfer as he does at baseline - unable to fully assess OOB ADL as planned     Vision Ability to See in Adequate Light: 0 Adequate Patient Visual Report: No change from  baseline Vision Assessment?: No apparent visual deficits     Perception     Praxis      Pertinent Vitals/Pain Pain Assessment Pain Assessment: Faces Faces Pain Scale: Hurts a little bit Pain Location: BLE, scrotum Pain Descriptors / Indicators: Cramping Pain Intervention(s): Monitored during session     Hand Dominance Right   Extremity/Trunk Assessment Upper Extremity Assessment Upper Extremity Assessment: Overall WFL for tasks assessed   Lower Extremity Assessment Lower Extremity Assessment: Defer to PT evaluation   Cervical / Trunk Assessment Cervical / Trunk  Assessment: Normal   Communication Communication Communication: No difficulties   Cognition Arousal/Alertness: Awake/alert Behavior During Therapy: WFL for tasks assessed/performed, Agitated Overall Cognitive Status: Within Functional Limits for tasks assessed                                 General Comments: at baseline, initially very agitated by home setup/support questions citing therapist "in my business". later apologetic and cites not feeling well makes him "crotchey"     General Comments  SpO2 93% on 5 L O2    Exercises     Shoulder Instructions      Home Living Family/patient expects to be discharged to:: Private residence Living Arrangements: Spouse/significant other Available Help at Discharge: Family;Personal care attendant;Available PRN/intermittently Type of Home: House Home Access: Ramped entrance     Home Layout: One level     Bathroom Shower/Tub: Other (comment);Sponge bathes at baseline (cannot access bathroom with power chair)     Bathroom Accessibility: No   Home Equipment: BSC/3in1;Wheelchair - power;Wheelchair - manual   Additional Comments: pt reports wife with hx of heart transplant, daughter in and out of home.      Prior Functioning/Environment Prior Level of Function : Needs assist             Mobility Comments: typically stand pivot vs scoot to power chair ADLs Comments: Uses BSC as power chair will not fit in bathroom doorway, sponge bathes. Has aide assist 4 days/wk for 2 hours who assists with bathing/dressing. reports some difficulty reaching to wipe bottom recently. pt not forthcoming with all information/assist available. uses public transportation for MD appts and HD        OT Problem List: Decreased strength;Decreased activity tolerance;Impaired balance (sitting and/or standing);Pain      OT Treatment/Interventions: Self-care/ADL training;Therapeutic exercise;Energy conservation;DME and/or AE  instruction;Therapeutic activities;Patient/family education    OT Goals(Current goals can be found in the care plan section) Acute Rehab OT Goals Patient Stated Goal: improve breathing OT Goal Formulation: With patient Time For Goal Achievement: 06/15/23 Potential to Achieve Goals: Good ADL Goals Pt Will Perform Lower Body Bathing: with min assist;sitting/lateral leans;bed level Pt Will Transfer to Toilet: with min assist;stand pivot transfer;squat pivot transfer;bedside commode Pt/caregiver will Perform Home Exercise Program: Increased strength;With theraband;Both right and left upper extremity;Independently;With written HEP provided  OT Frequency: Min 2X/week    Co-evaluation              AM-PAC OT "6 Clicks" Daily Activity     Outcome Measure Help from another person eating meals?: None Help from another person taking care of personal grooming?: A Little Help from another person toileting, which includes using toliet, bedpan, or urinal?: A Lot Help from another person bathing (including washing, rinsing, drying)?: A Lot Help from another person to put on and taking off regular upper body clothing?: A Little Help from another person to  put on and taking off regular lower body clothing?: A Lot 6 Click Score: 16   End of Session Equipment Utilized During Treatment: Oxygen Nurse Communication: Mobility status;Other (comment) (request for nasal spray)  Activity Tolerance: Other (comment) (self limiting) Patient left: in bed;with call bell/phone within reach  OT Visit Diagnosis: Unsteadiness on feet (R26.81);Other abnormalities of gait and mobility (R26.89)                Time: 1107-1130 OT Time Calculation (min): 23 min Charges:  OT General Charges $OT Visit: 1 Visit OT Evaluation $OT Eval Moderate Complexity: 1 Mod  Bradd Canary, OTR/L Acute Rehab Services Office: [phone]   Lorre Munroe 06/01/2023, 11:48 AM

## 2023-06-01 NOTE — Assessment & Plan Note (Addendum)
CBGs elevated to 444. Tales Humulin 70-125 units BID at home. - Sensitive SSI (20u Semglee)

## 2023-06-01 NOTE — ED Notes (Addendum)
ED TO INPATIENT HANDOFF REPORT  ED Nurse Name and Phone #: Morrie Sheldon [phone]    S Name/Age/Gender Jeffrey [name] 62 y.o. male Room/Bed: 006C/006C  Code Status   Code Status: Full Code  Home/SNF/Other Home Patient oriented to: self, place, time, and situation Is this baseline? Yes   Triage Complete: Triage complete  Chief Complaint Shortness of breath [R06.02]  Triage Note Pt c/o SOB X 10 days with nose bleeds.  Dialysis T,T,S.  Last tx was Saturday, missed ride on Tuesday.   Allergies Allergies  Allergen Reactions   Actos [Pioglitazone] Anaphylaxis and Rash   Dexmedetomidine Nausea And Vomiting and Other (See Comments)    (Precedex) Dose-limiting bradycardia     Ibuprofen Shortness Of Breath, Swelling and Other (See Comments)    Pt tolerates aspirin    Tomato Anaphylaxis and Other (See Comments)    Only allergic to RAW tomatoes   Wellbutrin [Bupropion] Swelling    Level of Care/Admitting Diagnosis ED Disposition     ED Disposition  Admit   Condition  --   Comment  Hospital Area: [facility] [[id number]]  Level of Care: Telemetry Medical [104]  May place patient in observation at [facility] or [facility] if equivalent level of care is available:: No  Covid Evaluation: Confirmed COVID Negative  Diagnosis: Shortness of breath [786.05.ICD-9-CM]  Admitting Physician: Darral Dash [[id number]]  Attending Physician: Carney Living [phone]          B Medical/Surgery History Past Medical History:  Diagnosis Date   Altered mental status 05/04/2021   Arthritis    Blood transfusion without reported diagnosis    CHF (congestive heart failure) (HCC)    COPD (chronic obstructive pulmonary disease) (HCC)    Depression    Diabetes mellitus without complication (HCC)    type 2   Dyspnea    ESRD on hemodialysis (HCC)    Tues Thurs Sat   GERD (gastroesophageal reflux disease)    protonix   Heart murmur    as a child, no problems    Hypertension    Myocardial infarction (HCC)    PTSD (post-traumatic stress disorder)    Sepsis (HCC)    Sleep apnea    occasional uses CPAP   Uses powered wheelchair    Past Surgical History:  Procedure Laterality Date   A/V FISTULAGRAM N/A 08/30/2021   Procedure: A/V FISTULAGRAM;  Surgeon: Maeola Harman, MD;  Location: [facility] INVASIVE CV LAB;  Service: Cardiovascular;  Laterality: N/A;   A/V FISTULAGRAM Right 07/28/2022   Procedure: A/V Fistulagram;  Surgeon: Maeola Harman, MD;  Location: [facility] INVASIVE CV LAB;  Service: Cardiovascular;  Laterality: Right;   A/V FISTULAGRAM Right 09/05/2022   Procedure: A/V Fistulagram;  Surgeon: Maeola Harman, MD;  Location: [facility] INVASIVE CV LAB;  Service: Cardiovascular;  Laterality: Right;   A/V FISTULAGRAM Right 10/31/2022   Procedure: A/V Fistulagram;  Surgeon: Maeola Harman, MD;  Location: [facility] INVASIVE CV LAB;  Service: Cardiovascular;  Laterality: Right;   A/V SHUNT INTERVENTION Right 10/31/2022   Procedure: A/V SHUNT INTERVENTION;  Surgeon: Maeola Harman, MD;  Location: [facility] INVASIVE CV LAB;  Service: Cardiovascular;  Laterality: Right;   A/V SHUNTOGRAM N/A 11/08/2021   Procedure: A/V SHUNTOGRAM;  Surgeon: Maeola Harman, MD;  Location: [facility] INVASIVE CV LAB;  Service: Cardiovascular;  Laterality: N/A;   AMPUTATION Left 04/03/2021   Procedure: LEFT ABOVE-THE-KNEE AMPUTATION;  Surgeon: Terance Hart, MD;  Location: [facility] OR;  Service: Orthopedics;  Laterality: Left;   AV FISTULA PLACEMENT Right 08/12/2021   Procedure: INSERTION OF ARTERIOVENOUS (AV) GORE-TEX GRAFT RIGHT ARM;  Surgeon: Nada Libman, MD;  Location: [facility] OR;  Service: Vascular;  Laterality: Right;   AV FISTULA PLACEMENT Left 04/07/2023   Procedure: ARTERIOVENOUS (AV) FISTULA GRAFT USING GORETEX STRETCH (4-7MM);  Surgeon: Chuck Hint, MD;  Location: [facility] OR;  Service: Vascular;  Laterality: Left;   INSERTION OF DIALYSIS  CATHETER N/A 11/25/2022   Procedure: INSERTION OF DIALYSIS CATHETER;  Surgeon: Lorin Glass, MD;  Location: [facility] ENDOSCOPY;  Service: Pulmonary;  Laterality: N/A;   INSERTION OF DIALYSIS CATHETER N/A 03/03/2023   Procedure: INSERTION OF TUNNELED DIALYSIS CATHETER;  Surgeon: Maeola Harman, MD;  Location: [facility] OR;  Service: Vascular;  Laterality: N/A;   INSERTION OF DIALYSIS CATHETER Left 04/07/2023   Procedure: INSERTION OF DIALYSIS CATHETER USING PALINDROME  28CM PRECISION CHRONIC CATHETER KIT;  Surgeon: Chuck Hint, MD;  Location: [facility] OR;  Service: Vascular;  Laterality: Left;   IR REMOVAL TUN CV CATH W/O FL  03/30/2021   JOINT REPLACEMENT     Bilateral knees   LIGATION ARTERIOVENOUS GORTEX GRAFT Right 03/03/2023   Procedure: LIGATION RIGHT ARM ARTERIOVENOUS GORTEX GRAFT;  Surgeon: Maeola Harman, MD;  Location: [facility] OR;  Service: Vascular;  Laterality: Right;   PERIPHERAL VASCULAR BALLOON ANGIOPLASTY Right 08/30/2021   Procedure: PERIPHERAL VASCULAR BALLOON ANGIOPLASTY;  Surgeon: Maeola Harman, MD;  Location: [facility] INVASIVE CV LAB;  Service: Cardiovascular;  Laterality: Right;   PERIPHERAL VASCULAR BALLOON ANGIOPLASTY Right 07/28/2022   Procedure: PERIPHERAL VASCULAR BALLOON ANGIOPLASTY;  Surgeon: Maeola Harman, MD;  Location: [facility] INVASIVE CV LAB;  Service: Cardiovascular;  Laterality: Right;  AVF   PERIPHERAL VASCULAR INTERVENTION  11/08/2021   Procedure: PERIPHERAL VASCULAR INTERVENTION;  Surgeon: Maeola Harman, MD;  Location: [facility] INVASIVE CV LAB;  Service: Cardiovascular;;  Central rt arm fistula   UPPER EXTREMITY VENOGRAPHY Right 08/10/2021   Procedure: UPPER EXTREMITY VENOGRAPHY;  Surgeon: Nada Libman, MD;  Location: [facility] INVASIVE CV LAB;  Service: Cardiovascular;  Laterality: Right;     A IV Location/Drains/Wounds Patient Lines/Drains/Airways Status     Active Line/Drains/Airways     Name Placement date Placement time Site Days    Peripheral IV 05/31/23 22 G Anterior;Right Hand 05/31/23  2200  Hand  1   Fistula / Graft Left Upper arm Arteriovenous vein graft 04/22/23  1000  Upper arm  40   Hemodialysis Catheter Left Internal jugular Double lumen Permanent (Tunneled) 04/07/23  1350  Internal jugular  55            Intake/Output Last 24 hours  Intake/Output Summary (Last 24 hours) at 06/01/2023 0557 Last data filed at 05/31/2023 2312 Gross per 24 hour  Intake --  Output 700 ml  Net -700 ml    Labs/Imaging Results for orders placed or performed during the hospital encounter of 05/31/23 (from the past 48 hour(s))  Hepatitis B surface antigen     Status: None   Collection Time: 05/31/23  9:19 PM  Result Value Ref Range   Hepatitis B Surface Ag NON REACTIVE NON REACTIVE    Comment: Performed at [facility], [address], [place], [place] [address]  I-stat chem 8, ED (not at [facility], [facility] or [facility])     Status: Abnormal   Collection Time: 05/31/23  9:42 PM  Result Value Ref Range   Sodium 135 135 - 145 mmol/L   Potassium 4.4  3.5 - 5.1 mmol/L   Chloride 97 (L) 98 - 111 mmol/L   BUN 90 (H) 8 - 23 mg/dL   Creatinine, Ser 1.61 (H) 0.61 - 1.24 mg/dL   Glucose, Bld 096 (H) 70 - 99 mg/dL    Comment: Glucose reference range applies only to samples taken after fasting for at least 8 hours.   Calcium, Ion 1.02 (L) 1.15 - 1.40 mmol/L   TCO2 29 22 - 32 mmol/L   Hemoglobin 10.9 (L) 13.0 - 17.0 g/dL   HCT 04.5 (L) 40.9 - 81.1 %  Renal function panel     Status: Abnormal   Collection Time: 06/01/23 12:30 AM  Result Value Ref Range   Sodium 136 135 - 145 mmol/L   Potassium 4.7 3.5 - 5.1 mmol/L   Chloride 93 (L) 98 - 111 mmol/L   CO2 25 22 - 32 mmol/L   Glucose, Bld 444 (H) 70 - 99 mg/dL    Comment: Glucose reference range applies only to samples taken after fasting for at least 8 hours.   BUN 82 (H) 8 - 23 mg/dL   Creatinine, Ser 9.14 (H) 0.61 - 1.24 mg/dL   Calcium 7.8 (L) 8.9 - 10.3 mg/dL   Phosphorus 5.1 (H)  2.5 - 4.6 mg/dL   Albumin 3.4 (L) 3.5 - 5.0 g/dL   GFR, Estimated 13 (L) >60 mL/min    Comment: (NOTE) Calculated using the CKD-EPI Creatinine Equation (2021)    Anion gap 18 (H) 5 - 15    Comment: Performed at [facility], [address] [place], [place] [address]  Hepatitis panel, acute     Status: None   Collection Time: 06/01/23 12:30 AM  Result Value Ref Range   Hepatitis B Surface Ag NON REACTIVE NON REACTIVE   HCV Ab NON REACTIVE NON REACTIVE    Comment: (NOTE) Nonreactive HCV antibody screen is consistent with no HCV infections,  unless recent infection is suspected or other evidence exists to indicate HCV infection.     Hep A IgM NON REACTIVE NON REACTIVE   Hep B C IgM NON REACTIVE NON REACTIVE    Comment: Performed at [facility], [address], [place], [place] [address]  CBC with Differential/Platelet     Status: Abnormal   Collection Time: 06/01/23 12:30 AM  Result Value Ref Range   WBC 6.0 4.0 - 10.5 K/uL   RBC 3.04 (L) 4.22 - 5.81 MIL/uL   Hemoglobin 8.4 (L) 13.0 - 17.0 g/dL   HCT 86.5 (L) 78.4 - 69.6 %   MCV 90.8 80.0 - 100.0 fL   MCH 27.6 26.0 - 34.0 pg   MCHC 30.4 30.0 - 36.0 g/dL   RDW 29.5 28.4 - 13.2 %   Platelets 128 (L) 150 - 400 K/uL   nRBC 0.0 0.0 - 0.2 %   Neutrophils Relative % 80 %   Neutro Abs 4.8 1.7 - 7.7 K/uL   Lymphocytes Relative 8 %   Lymphs Abs 0.5 (L) 0.7 - 4.0 K/uL   Monocytes Relative 8 %   Monocytes Absolute 0.5 0.1 - 1.0 K/uL   Eosinophils Relative 2 %   Eosinophils Absolute 0.1 0.0 - 0.5 K/uL   Basophils Relative 0 %   Basophils Absolute 0.0 0.0 - 0.1 K/uL   Immature Granulocytes 2 %   Abs Immature Granulocytes 0.09 (H) 0.00 - 0.07 K/uL    Comment: Performed at [facility], [address] [place], [place] [address]  DG Chest Port 1 View  Result Date: 05/31/2023 CLINICAL DATA:  Shortness of breath, epistaxis EXAM: PORTABLE CHEST 1 VIEW COMPARISON:  04/22/2023 FINDINGS: Lungs are essentially clear. Mild  perihilar fullness without frank interstitial edema. No pleural effusion or pneumothorax. Right upper mediastinal vascular stent. Cardiomegaly. IMPRESSION: Cardiomegaly.  No frank interstitial edema. Electronically Signed   By: Charline Bills M.D.   On: 05/31/2023 20:55    Pending Labs Unresulted Labs (From admission, onward)     Start     Ordered   06/01/23 0133  Renal function panel  Once,   URGENT        06/01/23 0132   06/01/23 0131  Renal function panel  Once,   URGENT        06/01/23 0131   05/31/23 2117  Hepatitis B surface antibody,quantitative  (New Admission Hemo Labs (Hepatitis B))  Once,   URGENT        05/31/23 2117            Vitals/Pain Today's Vitals   06/01/23 0416 06/01/23 0520 06/01/23 0521 06/01/23 0543  BP: (!) 142/63 125/66    Pulse: 85 90  87  Resp: 14 14  18   Temp: (!) 97.4 F (36.3 C)     TempSrc: Axillary     SpO2: 98% 100%  97%  Weight:      Height:      PainSc:   10-Worst pain ever     Isolation Precautions No active isolations  Medications Medications  aspirin EC tablet 81 mg (has no administration in time range)  oxyCODONE (Oxy IR/ROXICODONE) immediate release tablet 5 mg (has no administration in time range)  amLODipine (NORVASC) tablet 10 mg (has no administration in time range)  atorvastatin (LIPITOR) tablet 40 mg (has no administration in time range)  carvedilol (COREG) tablet 12.5 mg (has no administration in time range)  metolazone (ZAROXOLYN) tablet 10 mg (has no administration in time range)  nortriptyline (PAMELOR) capsule 20 mg (has no administration in time range)  sertraline (ZOLOFT) tablet 100 mg (has no administration in time range)  traZODone (DESYREL) tablet 150 mg (has no administration in time range)  calcium acetate (PHOSLO) capsule 1,334 mg (has no administration in time range)  calcium carbonate (dosed in mg elemental calcium) suspension 500 mg of elemental calcium (has no administration in time range)  tamsulosin  (FLOMAX) capsule 0.4 mg (has no administration in time range)  clopidogrel (PLAVIX) tablet 75 mg (has no administration in time range)  pregabalin (LYRICA) capsule 75 mg (has no administration in time range)  Vitamin D CAPS 2,000 Units (has no administration in time range)  heparin injection 5,000 Units (has no administration in time range)    Mobility Power chair     Focused Assessments See chart   R Recommendations: See Admitting Provider Note  Report given to:   Additional Notes: see chart

## 2023-06-01 NOTE — Progress Notes (Signed)
   06/01/23 0416  Vitals  Temp (!) 97.4 F (36.3 C)  Temp Source Axillary  BP (!) 142/63  MAP (mmHg) 86  BP Location Right Arm  BP Method Automatic  Patient Position (if appropriate) Lying  Pulse Rate 85  Pulse Rate Source Monitor  ECG Heart Rate 82  Resp 14  Oxygen Therapy  SpO2 98 %  O2 Device Simple Mask  During Treatment Monitoring  Intra-Hemodialysis Comments Tx completed  Post Treatment  Dialyzer Clearance Lightly streaked  Hemodialysis Intake (mL) 0 mL  Liters Processed 84  Fluid Removed (mL) 3750 mL  Tolerated HD Treatment Yes  Post-Hemodialysis Comments pt stable  AVG/AVF Arterial Site Held (minutes) 10 minutes  AVG/AVF Venous Site Held (minutes) 10 minutes  Fistula / Graft Left Upper arm Arteriovenous vein graft  Placement Date/Time: (c) 04/22/23 (c) 1000   Orientation: Left  Access Location: Upper arm  Access Type: Arteriovenous vein graft  Site Condition No complications  Fistula / Graft Assessment Thrill;Bruit;Present  Status Deaccessed  Drainage Description None   Report to Melynda Keller RN

## 2023-06-01 NOTE — Assessment & Plan Note (Signed)
Hemoglobin stable at 8.4 - Management per nephrology

## 2023-06-01 NOTE — Progress Notes (Signed)
PT Cancellation Note  Patient Details Name: Jeffrey [name] MRN: [id number] DOB: [birth year]   Cancelled Treatment:    Reason Eval/Treat Not Completed: Patient declined, no reason specified. Pt reports continued RLE spasms causing pain and asking to defer PT evaluation until after HD. Will continue to follow and attempt evaluation later as time/schedule allow.   Vickki Muff, PT, DPT   Acute Rehabilitation Department Office [phone] Secure Chat Communication Preferred   Ronnie Derby 06/01/2023, 12:31 PM

## 2023-06-01 NOTE — ED Notes (Signed)
Patient transported to dialysis

## 2023-06-01 NOTE — Progress Notes (Addendum)
Daily Progress Note Intern Pager: [phone]  Patient name: Jeffrey [name] Medical record number: [id number] Date of birth: [birth year] Age: 62 y.o. Gender: male  Primary Care Provider: Knox Royalty, MD Consultants: Nephrology Code Status: Full Code  Pt Overview and Major Events to Date:  07/04: Admitted  Assessment and Plan: Jeffrey [name] is a 62 year old male with past medical history of ESRD on HD, CHF, HTN, HLD, BPH, and depression admitted for COPD exacerbation due to volume overload in the setting of missing dialysis and requiring more oxygen than baseline (3 L nasal cannula at home).  Hospital Problem List      Hospital     * (Principal) Shortness of breath     On nasal cannula, maintain saturations between 88 to 92%.  Likely due  to hypervolemia due to missed dialysis session. Currently he is on 4 L [place]. - Wean [place] to baseline 3 L nasal cannula - PT/OT        COPD (chronic obstructive pulmonary disease) (HCC)     SpO2 goal 88-92%. Patient had to increase his 3 L of NS to 5 L at home. -  Monitor oxygen saturation  - Nasal saline rinse to address blood in his nose - Discontinue prednisone 40 mg daily and azithromycin since he doesn't  meet the cardinal symptoms for COPD exacerbation        Type 2 diabetes mellitus with hyperlipidemia (HCC)     CBGs elevated to 444. Tales Humulin 70-125 units BID at home. - Sensitive SSI (20u [place])        Anemia of chronic disease     Hemoglobin stable at 8.4 - Management per nephrology        ESRD on dialysis (HCC)     Tolerated 3.5-hour HD session overnight. May require another HD session  prior to discharge.  - Continue PhosLo - Nephrology consulted, appreciate recs     Other chronic conditions stable, continue home regimen as below: - Hypertension: Continue amlodipine 10 mg daily, carvedilol 12.5 mg twice daily - Hyperlipidemia: Continue atorvastatin 40 mg daily - PAD: Continue Plavix 75 mg daily and aspirin 81 mg  daily - Depression: Continue Zoloft 100 mg daily - BPH: Continue Flomax 0.4 mg daily    FEN/GI: Carb Modified PPx: Heparin Dispo: pending clinical improvement   Subjective:  Patient is doing well this morning.  Reports that he is requiring 5 L of oxygen compared to his normal 3 L. He reports having some dried up blood in his nose and cramping in his right lower extremity which he associates to "having too much taken from him during HD".  Denies wheezing, chest discomfort, chest pain, palpitations, headache, abdominal pain.  Objective: Temp:  [97.4 F (36.3 C)-98.8 F (37.1 C)] 98 F (36.7 C) (07/04 0801) Pulse Rate:  [82-103] 90 (07/04 0801) Resp:  [14-19] 19 (07/04 0801) BP: (114-147)/(44-78) 135/61 (07/04 0801) SpO2:  [97 %-100 %] 100 % (07/04 0801) Weight:  [140.2 kg-141.5 kg] 140.2 kg (07/04 0651)  Physical Exam: General: NAD, awake and alert Cardiovascular: RRR. Transmitted fistula sounds Respiratory: Normal WOB on 5 L [place]. Mild rhonchorous sounds in bases, no crackles. Abdomen: scar from pancreatic surgery, soft, nontender, nondistended Extremities: BLE edema at baseline, L AKA Neuro: A&Ox3. No focal neurological deficits.  Laboratory: Most recent CBC Lab Results  Component Value Date   WBC 6.0 06/01/2023   HGB 8.4 (L) 06/01/2023   HCT 27.6 (L) 06/01/2023   MCV 90.8 06/01/2023  PLT 128 (L) 06/01/2023   Most recent BMP    Latest Ref Rng & Units 06/01/2023   12:30 AM  BMP  Glucose 70 - 99 mg/dL 161   BUN 8 - 23 mg/dL 82   Creatinine 0.96 - 1.24 mg/dL 0.45   Sodium 409 - 811 mmol/L 136   Potassium 3.5 - 5.1 mmol/L 4.7   Chloride 98 - 111 mmol/L 93   CO2 22 - 32 mmol/L 25   Calcium 8.9 - 10.3 mg/dL 7.8     Imaging/Diagnostic Tests: No new imaging.  Fortunato Curling, DO 06/01/2023, 10:38 AM PGY-1, [facility]  FPTS Intern pager: [phone], text pages welcome Secure chat group [facility] Teaching Service

## 2023-06-01 NOTE — ED Notes (Signed)
Pt back to room 6 from dialysis.

## 2023-06-01 NOTE — Assessment & Plan Note (Addendum)
Tolerated 3.5-hour HD session overnight.  See above plan under shortness of breath. -Continue PhosLo -RFP this morning -Nephrology following

## 2023-06-01 NOTE — Assessment & Plan Note (Addendum)
On nasal cannula, maintain saturations between 88 to 92%.  Likely due to hypervolemia due to missed dialysis session. Currently he is on 4 L [place]. - Wean [place] to baseline 3 L nasal cannula - PT/OT

## 2023-06-01 NOTE — H&P (Signed)
Hospital Admission History and Physical Service Pager: [phone]  Patient name: Jeffrey [name] Medical record number: [id number] Date of Birth: [birth year] Age: 62 y.o. Gender: male  Primary Care Provider: Knox Royalty, MD Consultants: Nephrology Code Status: Full which was confirmed with family if patient unable to confirm   Preferred Emergency Contact:  Contact Information     Name Relation Home Work Mobile   [place] Daughter   [phone]   Baron Sane   [phone]        Chief Complaint: Shortness of breath, volume overload  Assessment and Plan: HASHIM SERMERSHEIM is a 62 y.o. male presenting with volume overload in the setting of missed dialysis session and requiring more oxygen than baseline requirement (on 3 L nasal cannula at home). Differential for presentation of this includes CHF exacerbation (other most recent echocardiogram 2022 shows EF 60 to 65%, no diastolic relaxation impairment), anasarca from renal disease, PE, MI, anemia, viral or bacterial process such as pneumonia, pneumothorax. Chest x-ray reviewed, no edema or pneumothorax.  Patient presenting as he typically does in the setting of a missed dialysis session, making this the most likely differential.  He likely just needs another session of dialysis.  Will continue to observe him and workup other potential causes.  Hospital Problem List      Hospital     * (Principal) Shortness of breath     On nasal cannula, satting appropriately.  Likely due to hypervolemia. -Admit to med telemetry, attending Dr. Deirdre Priest -Wean [place] to baseline 3 L nasal cannula -Nephrology following, may need another HD session prior to discharge -PT/OT        COPD (chronic obstructive pulmonary disease) (HCC)     SpO2 goal 88-92%.  Possible COPD exacerbation given 2 out of 3 cardinal  symptoms with increased sputum production and purulence. -Monitor respiratory status -Start prednisone 40 mg daily -Start  azithromycin 500 mg daily for 5 days        Type 2 diabetes mellitus with hyperlipidemia (HCC)     CBGs elevated -CBGs 4 times daily at mealtime, and for bed -Very sensitive SSI        Anemia of chronic disease     Hemoglobin stable at 8.4 -Management per nephrology        ESRD on dialysis (HCC)     Tolerated 3.5-hour HD session overnight.  See above plan under  shortness of breath. -Continue PhosLo -RFP this morning -Nephrology following      Other chronic conditions stable, continue home regimen as below: -Hyperlipidemia: Continue atorvastatin 40 mg daily -PAD: Continue Plavix 75 mg daily and aspirin 81 mg daily -Depression: Continue Zoloft 100 mg daily -BPH: Continue Flomax 0.4 mg daily -Hypertension: Continue amlodipine 10 mg daily, carvedilol 12.5 mg twice daily  FEN/GI: Renal, fluid restricted VTE Prophylaxis: Heparin 5000 units subcu q 8 hours  Disposition: Med telemetry  History of Present Illness:  Jeffrey [name] is a 62 y.o. male presenting with hyperkalemia in setting of ESRD, with missed HD session.  Most recent HD session 6/29, and then missed his treatment thereafter.  Presented with shortness of breath requiring supplemental oxygen in ER.  Per my chart review, he persistently wears 3 L nasal cannula.  On 6/25, he was on nasal cannula during his dialysis session.  This oxygen requirement is not new for him.  Patient reports that he has had a cough worsened purulent sputum production for 1 week. Denies fevers or chills.  Does have some  sinus congestion with "boogers" in his nose.  Does not smoke cigarettes.  Used to be a smoker.  Quit in 2012.  In the ED, patient presented with hypervolemia and shortness of breath in setting of missed HD session.  Initial blood work with BUN 90, creatinine 5.20, glucose 407, hemoglobin 8.4.  He was hemodynamically stable.  Chest x-ray showed mild perihilar fullness, but no frank edema.  He initially required nonrebreather mask.   However, he wears 3 L at baseline.  After his HD session, when he was placed on 4 L nasal cannula he maintained SpO2 98%.  The ED provider was concern for systolic CHF, but per my review the patient does not have this diagnosis with LVEF in the normal range.  He is on diuretics for ESRD and intermittent missing of dialysis sessions (torsemide and metolazone). He received a 3.5-hour HD session without complication.  Review Of Systems: Per HPI  Pertinent Past Medical History: ESRD on HD Tuesday, Thursday, Saturday Anemia of chronic kidney disease Neuropathy OSA (wears CPAP nightly) BPH PAD s/p AKA (on aspirin, Plavix and atorvastatin) Hypertension Depression Remainder reviewed in history tab.   Pertinent Past Surgical History: Past Surgical History:  Procedure Laterality Date   A/V FISTULAGRAM N/A 08/30/2021   Procedure: A/V FISTULAGRAM;  Surgeon: Maeola Harman, MD;  Location: [facility] INVASIVE CV LAB;  Service: Cardiovascular;  Laterality: N/A;   A/V FISTULAGRAM Right 07/28/2022   Procedure: A/V Fistulagram;  Surgeon: Maeola Harman, MD;  Location: [facility] INVASIVE CV LAB;  Service: Cardiovascular;  Laterality: Right;   A/V FISTULAGRAM Right 09/05/2022   Procedure: A/V Fistulagram;  Surgeon: Maeola Harman, MD;  Location: [facility] INVASIVE CV LAB;  Service: Cardiovascular;  Laterality: Right;   A/V FISTULAGRAM Right 10/31/2022   Procedure: A/V Fistulagram;  Surgeon: Maeola Harman, MD;  Location: [facility] INVASIVE CV LAB;  Service: Cardiovascular;  Laterality: Right;   A/V SHUNT INTERVENTION Right 10/31/2022   Procedure: A/V SHUNT INTERVENTION;  Surgeon: Maeola Harman, MD;  Location: [facility] INVASIVE CV LAB;  Service: Cardiovascular;  Laterality: Right;   A/V SHUNTOGRAM N/A 11/08/2021   Procedure: A/V SHUNTOGRAM;  Surgeon: Maeola Harman, MD;  Location: [facility] INVASIVE CV LAB;  Service: Cardiovascular;  Laterality: N/A;   AMPUTATION Left 04/03/2021    Procedure: LEFT ABOVE-THE-KNEE AMPUTATION;  Surgeon: Terance Hart, MD;  Location: [facility] OR;  Service: Orthopedics;  Laterality: Left;   AV FISTULA PLACEMENT Right 08/12/2021   Procedure: INSERTION OF ARTERIOVENOUS (AV) GORE-TEX GRAFT RIGHT ARM;  Surgeon: Nada Libman, MD;  Location: [facility] OR;  Service: Vascular;  Laterality: Right;   AV FISTULA PLACEMENT Left 04/07/2023   Procedure: ARTERIOVENOUS (AV) FISTULA GRAFT USING GORETEX STRETCH (4-7MM);  Surgeon: Chuck Hint, MD;  Location: [facility] OR;  Service: Vascular;  Laterality: Left;   INSERTION OF DIALYSIS CATHETER N/A 11/25/2022   Procedure: INSERTION OF DIALYSIS CATHETER;  Surgeon: Lorin Glass, MD;  Location: [facility] ENDOSCOPY;  Service: Pulmonary;  Laterality: N/A;   INSERTION OF DIALYSIS CATHETER N/A 03/03/2023   Procedure: INSERTION OF TUNNELED DIALYSIS CATHETER;  Surgeon: Maeola Harman, MD;  Location: [facility] OR;  Service: Vascular;  Laterality: N/A;   INSERTION OF DIALYSIS CATHETER Left 04/07/2023   Procedure: INSERTION OF DIALYSIS CATHETER USING PALINDROME  28CM PRECISION CHRONIC CATHETER KIT;  Surgeon: Chuck Hint, MD;  Location: [facility] OR;  Service: Vascular;  Laterality: Left;   IR REMOVAL TUN CV CATH W/O [facility]  03/30/2021   JOINT REPLACEMENT  Bilateral knees   LIGATION ARTERIOVENOUS GORTEX GRAFT Right 03/03/2023   Procedure: LIGATION RIGHT ARM ARTERIOVENOUS GORTEX GRAFT;  Surgeon: Maeola Harman, MD;  Location: [facility] OR;  Service: Vascular;  Laterality: Right;   PERIPHERAL VASCULAR BALLOON ANGIOPLASTY Right 08/30/2021   Procedure: PERIPHERAL VASCULAR BALLOON ANGIOPLASTY;  Surgeon: Maeola Harman, MD;  Location: [facility] INVASIVE CV LAB;  Service: Cardiovascular;  Laterality: Right;   PERIPHERAL VASCULAR BALLOON ANGIOPLASTY Right 07/28/2022   Procedure: PERIPHERAL VASCULAR BALLOON ANGIOPLASTY;  Surgeon: Maeola Harman, MD;  Location: [facility] INVASIVE CV LAB;  Service: Cardiovascular;  Laterality: Right;   AVF   PERIPHERAL VASCULAR INTERVENTION  11/08/2021   Procedure: PERIPHERAL VASCULAR INTERVENTION;  Surgeon: Maeola Harman, MD;  Location: [facility] INVASIVE CV LAB;  Service: Cardiovascular;;  Central rt arm fistula   UPPER EXTREMITY VENOGRAPHY Right 08/10/2021   Procedure: UPPER EXTREMITY VENOGRAPHY;  Surgeon: Nada Libman, MD;  Location: [facility] INVASIVE CV LAB;  Service: Cardiovascular;  Laterality: Right;    Remainder reviewed in history tab.  Pertinent Social History: Tobacco use: Former Alcohol use: Occasional- every month or so Other Substance use: Denies Lives with daughter who helps care for him  Pertinent Family History: No pertinent family history  Remainder reviewed in history tab.   Important Outpatient Medications: Aspirin 81 mg daily Amlodipine 10 mg daily Atorvastatin 40 mg daily Follows low daily Carvedilol 12.5 mg twice daily Plavix 75 mg daily Flexeril 10 mg 3 times daily as needed Diazepam 5 mg twice daily as needed for muscle spasms Concentrated regular insulin (prescribed 45 units into the skin dinner, but patient takes 70 to 125 units in the skin twice daily) Metolazone 10 mg daily Oxycodone 5 mg every 8 hours as needed Protonix 40 mg daily Pregabalin 75 mg on dialysis days Zoloft 100 mg daily Tamsulosin 0.4 mg daily Torsemide 120 mg twice daily Trazodone 150 mg nightly Remainder reviewed in medication history.   Objective: BP 125/66   Pulse 87   Temp (!) 97.4 F (36.3 C) (Axillary)   Resp 18   Ht 5\' 5"  (1.651 m)   Wt (!) 140.2 kg   SpO2 97%   BMI 51.42 kg/m  Exam: General: Laying in the bed no distress, obese, chronically ill-appearing but nontoxic Eyes: Pupils PERRLA ENTM: Moist, clear Neck: No cervical LAD Cardiovascular: Regular rate and rhythm, no murmurs appreciated.  Able to hear systolic sounds from left fistula Respiratory: Normal WOB on 3L [place], able to speak in full sentences. Mild rhonchorous sounds in bases, no crackles. Good  air movement Gastrointestinal: Obese, soft, non-tender MSK: S/p AKA.  Bilateral lower extremity edema present which appears to be at his baseline Derm: No appreciable rashes or lesions. Well-healed scar over right shoulder from shoulder arthroplasty Neuro: A&O x4, speech clear and fluent. Psych: Normal mood and affect  Labs:  CBC BMET  Recent Labs  Lab 06/01/23 0030  WBC 6.0  HGB 8.4*  HCT 27.6*  PLT 128*   Recent Labs  Lab 06/01/23 0030  NA 136  K 4.7  CL 93*  CO2 25  BUN 82*  CREATININE 4.93*  GLUCOSE 444*  CALCIUM 7.8*     EKG: NSR, no acute ST or T wave changes.  No bundle branch block appreciated   Imaging Studies Performed:  DG Chest Port 1 View  Result Date: 05/31/2023 CLINICAL DATA:  Shortness of breath, epistaxis EXAM: PORTABLE CHEST 1 VIEW COMPARISON:  04/22/2023 FINDINGS: Lungs are essentially clear. Mild perihilar fullness without frank interstitial edema.  No pleural effusion or pneumothorax. Right upper mediastinal vascular stent. Cardiomegaly. IMPRESSION: Cardiomegaly.  No frank interstitial edema. Electronically Signed   By: Charline Bills M.D.   On: 05/31/2023 20:55      Darral Dash, DO 06/01/2023, 6:52 AM PGY-3, [facility]  FPTS Intern pager: [phone], text pages welcome Secure chat group [facility] Teaching Service

## 2023-06-01 NOTE — Assessment & Plan Note (Deleted)
SpO2 goal 88-92%. Patient had to increase his 3 L of [place] to 5 L at home. - Monitor oxygen saturation  - Continue nasal saline rinse - PT/OT

## 2023-06-01 NOTE — Hospital Course (Addendum)
Jeffrey [name] is a 62 y.o.male with a history of ESRD on HD (TTS), hypertension, hyperlipidemia, PAD s/p AKA who was admitted to the [facility] teaching Service at [facility] for dyspnea. His hospital course is detailed below:  Dyspnea/Hypervolemia in setting of ESRD with missed HD session Patient arrived to ER for HD session.  Had a acute shortness of breath requiring nonrebreather.  He missed HD session, which he received the evening of admission along with a repeat session the day after.  He is unable to go to his outpatient HD facility due to issues in the past, so we will continue to come to the ED to get HD Tuesday, Thursday, Saturday.  Hypertension His BP remained stable with his home medications.   Type 2 diabetes mellitus Patient had elevated blood sugars in the hospital and we adjusted his long acting insulin to 40 units and resistant sliding scale insulin regimen.  Has long-acting insulin was increased due to elevated CBGs to ***.    Other chronic conditions were medically managed with home medications and formulary alternatives as necessary (depression, BPH)  PCP Follow-up Recommendations: Patient reports forming his own regimen for insulin administration at home. It may be beneficial to review his insulin regimen and advise appropriate changes to have better glycemic control.

## 2023-06-01 NOTE — Assessment & Plan Note (Signed)
On nasal cannula, satting appropriately.  Likely due to hypervolemia. -Admit to med telemetry, attending Dr. Deirdre Priest -Wean [place] to baseline 3 L nasal cannula -Nephrology following, may need another HD session prior to discharge -PT/OT

## 2023-06-01 NOTE — Progress Notes (Signed)
[facility] NEPHROLOGY PROGRESS NOTE  Assessment/ Plan: Pt is a 62 y.o. yo male ESRD on HD well-known to me from previous visits, discharged from outpatient HD who comes to ER for dialysis.  # Acute on chronic hypoxic respiratory failure/fluid overload: Received dialysis overnight with improvement of breathing.  Need more fluid removal.  Continue oxygen and bronchodilators per primary team.  # ESRD on HD: Missing dialysis often and has no outpatient HD center.  Underwent HD last night with 3.7 L UF.  He is still fluid overload therefore plan for HD today with around 3 L ultrafiltration goal.  He cramps easily.  He has AV graft for the access.  # Hemodialysis vascular access. Pt had new LUA AVG and LIJ TDC placed on 04/07/23 by Dr Edilia Bo. TDC was removed on 05/23/23. Using AVG now.   # Anemia: Treated with iron and Aranesp.  Monitor hemoglobin.  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  Continue PhosLo.  # Hypertension/volume: UF with HD as discussed above.  Continue antihypertensives.  Need fluid restriction.  Subjective: Seen and examined.  He reports that his breathing is much better.  He completed HD early this morning, reports some cramping at the end of the treatment.  Using around 4 L of oxygen.  Objective Vital signs in last 24 hours: Vitals:   06/01/23 0520 06/01/23 0543 06/01/23 0651 06/01/23 0801  BP: 125/66   135/61  Pulse: 90 87  90  Resp: 14 18  19   Temp:    98 F (36.7 C)  TempSrc:      SpO2: 100% 97%  100%  Weight:   (!) 140.2 kg   Height:   5\' 5"  (1.651 m)    Weight change:   Intake/Output Summary (Last 24 hours) at 06/01/2023 1201 Last data filed at 06/01/2023 1050 Gross per 24 hour  Intake 236 ml  Output 4700 ml  Net -4464 ml       Labs: RENAL PANEL Recent Labs  Lab 05/27/23 1058 05/31/23 2142 06/01/23 0030 06/01/23 1056  NA 135 135 136 133*  K 4.0 4.4 4.7 4.4  CL 95* 97* 93* 94*  CO2 27  --  25 27  GLUCOSE 149* 407* 444*  330*  BUN 68* 90* 82* 48*  CREATININE 5.19* 5.20* 4.93* 3.66*  CALCIUM 8.1*  --  7.8* 7.9*  PHOS 4.1  --  5.1*  --   ALBUMIN 3.5  --  3.4* 3.7    Liver Function Tests: Recent Labs  Lab 05/27/23 1058 06/01/23 0030 06/01/23 1056  AST  --   --  15  ALT  --   --  14  ALKPHOS  --   --  94  BILITOT  --   --  0.4  PROT  --   --  7.6  ALBUMIN 3.5 3.4* 3.7   No results for input(s): "LIPASE", "AMYLASE" in the last 168 hours. No results for input(s): "AMMONIA" in the last 168 hours. CBC: Recent Labs    12/02/22 1547 12/03/22 1814 01/24/23 1035 01/26/23 0736 04/11/23 1440 04/18/23 0756 05/18/23 0854 05/23/23 0940 05/23/23 0942 05/27/23 1057 05/31/23 2142 06/01/23 0030  HGB  --    < > 9.0*   < > 7.2*   < > 8.2* 8.8*  --  8.8* 10.9* 8.4*  MCV  --    < > 88.1   < > 91.0   < > 88.9 88.9  --  89.6  --  90.8  FERRITIN 1,056*  --  935*  --  545*  --   --   --  451*  --   --   --   TIBC 209*  --  255  --  230*  --   --   --  225*  --   --   --   IRON 64  --  55  --  26*  --   --   --  26*  --   --   --    < > = values in this interval not displayed.    Cardiac Enzymes: No results for input(s): "CKTOTAL", "CKMB", "CKMBINDEX", "TROPONINI" in the last 168 hours. CBG: Recent Labs  Lab 06/01/23 1000 06/01/23 1139  GLUCAP 275* 353*    Iron Studies: No results for input(s): "IRON", "TIBC", "TRANSFERRIN", "FERRITIN" in the last 72 hours. Studies/Results: ECHOCARDIOGRAM COMPLETE  Result Date: 06/01/2023    ECHOCARDIOGRAM REPORT   Patient Name:   Jeffrey [name] Date of Exam: 06/01/2023 Medical Rec #:  [id number]     Height:       65.0 in Accession #:    [id number]    Weight:       309.0 lb Date of Birth:  01/27/1961     BSA:          2.380 m Patient Age:    61 years      BP:           125/66 mmHg Patient Gender: M             HR:           98 bpm. Exam Location:  Inpatient Procedure: 2D Echo, Cardiac Doppler and Color Doppler Indications:    Dyspnea R06.00  History:        Patient has prior  history of Echocardiogram examinations, most                 recent 11/08/2021. COPD; Risk Factors:Hypertension, Sleep Apnea                 and Diabetes. ESRD.  Sonographer:    Lucendia Herrlich Referring Phys: [id number] MARSHALL L CHAMBLISS IMPRESSIONS  1. No IV access for definity use.  2. Left ventricular ejection fraction, by estimation, is 55 to 60%. The left ventricle has normal function. The left ventricle has no regional wall motion abnormalities. There is mild left ventricular hypertrophy. Left ventricular diastolic parameters were normal.  3. Right ventricular systolic function is normal. The right ventricular size is normal.  4. Left atrial size was moderately dilated.  5. The mitral valve is abnormal. No evidence of mitral valve regurgitation. No evidence of mitral stenosis.  6. The aortic valve is tricuspid. There is mild calcification of the aortic valve. There is mild thickening of the aortic valve. Aortic valve regurgitation is not visualized. Aortic valve sclerosis is present, with no evidence of aortic valve stenosis.  7. The inferior vena cava is normal in size with greater than 50% respiratory variability, suggesting right atrial pressure of 3 mmHg. FINDINGS  Left Ventricle: Left ventricular ejection fraction, by estimation, is 55 to 60%. The left ventricle has normal function. The left ventricle has no regional wall motion abnormalities. The left ventricular internal cavity size was normal in size. There is  mild left ventricular hypertrophy. Left ventricular diastolic parameters were normal. Right Ventricle: The right ventricular size is normal. No increase in right ventricular wall thickness. Right ventricular systolic function is normal. Left Atrium: Left atrial size  was moderately dilated. Right Atrium: Right atrial size was normal in size. Pericardium: There is no evidence of pericardial effusion. Mitral Valve: The mitral valve is abnormal. There is mild thickening of the mitral valve  leaflet(s). There is mild calcification of the mitral valve leaflet(s). Mild mitral annular calcification. No evidence of mitral valve regurgitation. No evidence of mitral valve stenosis. Tricuspid Valve: The tricuspid valve is normal in structure. Tricuspid valve regurgitation is mild . No evidence of tricuspid stenosis. Aortic Valve: The aortic valve is tricuspid. There is mild calcification of the aortic valve. There is mild thickening of the aortic valve. Aortic valve regurgitation is not visualized. Aortic valve sclerosis is present, with no evidence of aortic valve stenosis. Aortic valve peak gradient measures 12.4 mmHg. Pulmonic Valve: The pulmonic valve was normal in structure. Pulmonic valve regurgitation is not visualized. No evidence of pulmonic stenosis. Aorta: The aortic root is normal in size and structure. Venous: The inferior vena cava is normal in size with greater than 50% respiratory variability, suggesting right atrial pressure of 3 mmHg. IAS/Shunts: No atrial level shunt detected by color flow Doppler. Additional Comments: No IV access for definity use.  LEFT VENTRICLE PLAX 2D LVIDd:         4.60 cm   Diastology LVIDs:         3.40 cm   LV e' medial:    9.96 cm/s LV PW:         1.15 cm   LV E/e' medial:  10.8 LV IVS:        1.20 cm   LV e' lateral:   9.32 cm/s LVOT diam:     2.20 cm   LV E/e' lateral: 11.6 LV SV:         69 LV SV Index:   29 LVOT Area:     3.80 cm  RIGHT VENTRICLE TAPSE (M-mode): 2.2 cm LEFT ATRIUM             Index        RIGHT ATRIUM           Index LA diam:        4.40 cm 1.85 cm/m   RA Area:     18.60 cm LA Vol (A2C):   69.7 ml 29.29 ml/m  RA Volume:   56.10 ml  23.57 ml/m LA Vol (A4C):   98.3 ml 41.31 ml/m LA Biplane Vol: 93.8 ml 39.41 ml/m  AORTIC VALVE AV Area (Vmax): 2.19 cm AV Vmax:        176.00 cm/s AV Peak Grad:   12.4 mmHg LVOT Vmax:      101.50 cm/s LVOT Vmean:     64.250 cm/s LVOT VTI:       0.182 m  AORTA Ao Root diam: 3.20 cm Ao Asc diam:  3.30 cm MITRAL  VALVE                TRICUSPID VALVE MV Area (PHT): 4.21 cm     TR Peak grad:   10.5 mmHg MV Decel Time: 180 msec     TR Vmax:        162.00 cm/s MR Peak grad: 31.4 mmHg MR Vmax:      280.00 cm/s   SHUNTS MV E velocity: 108.00 cm/s  Systemic VTI:  0.18 m MV A velocity: 130.00 cm/s  Systemic Diam: 2.20 cm MV E/A ratio:  0.83 Charlton Haws MD Electronically signed by Charlton Haws MD Signature Date/Time: 06/01/2023/10:20:29 AM    Final  DG Chest Port 1 View  Result Date: 05/31/2023 CLINICAL DATA:  Shortness of breath, epistaxis EXAM: PORTABLE CHEST 1 VIEW COMPARISON:  04/22/2023 FINDINGS: Lungs are essentially clear. Mild perihilar fullness without frank interstitial edema. No pleural effusion or pneumothorax. Right upper mediastinal vascular stent. Cardiomegaly. IMPRESSION: Cardiomegaly.  No frank interstitial edema. Electronically Signed   By: Charline Bills M.D.   On: 05/31/2023 20:55    Medications: Infusions:   Scheduled Medications:  amLODipine  10 mg Oral q morning   aspirin EC  81 mg Oral Daily   atorvastatin  40 mg Oral Daily   calcium acetate  1,334 mg Oral TID with meals   carvedilol  12.5 mg Oral BID WC   Chlorhexidine Gluconate Cloth  6 each Topical Daily   [START ON 06/02/2023] Chlorhexidine Gluconate Cloth  6 each Topical Q0600   cholecalciferol  2,000 Units Oral Daily   clopidogrel  75 mg Oral Daily   heparin  5,000 Units Subcutaneous Q8H   insulin aspart  0-9 Units Subcutaneous TID WC   insulin glargine-yfgn  20 Units Subcutaneous Daily   metolazone  10 mg Oral Daily   nortriptyline  20 mg Oral QHS   pregabalin  75 mg Oral Q T,Th,Sa-HD   sertraline  100 mg Oral Daily   tamsulosin  0.4 mg Oral Daily   traZODone  150 mg Oral QHS    have reviewed scheduled and prn medications.  Physical Exam: General:NAD, comfortable Heart:RRR, s1s2 nl Lungs: Reduced breath sound bilateral, able to speak full sentence Abdomen:soft, Non-tender, non-distended Extremities: Bilateral total  lower extremities has chronic pitting edema ++. Dialysis Access: AV graft has good thrill and bruit.  Garritt Molyneux Jaynie Collins 06/01/2023,12:01 PM  LOS: 0 days

## 2023-06-01 NOTE — Assessment & Plan Note (Addendum)
SpO2 goal 88-92%.  Possible COPD exacerbation given 2 out of 3 cardinal symptoms with increased sputum production and purulence. -Monitor respiratory status -Start prednisone 40 mg daily -Start azithromycin 500 mg daily for 5 days

## 2023-06-01 NOTE — Assessment & Plan Note (Addendum)
SpO2 goal 88-92%. Patient had to increase his 3 L of [place] to 5 L at home. -  Monitor oxygen saturation  - Discontinue prednisone 40 mg daily and azithromycin since he doesn't meet the cardinal symptoms for COPD exacerbation

## 2023-06-01 NOTE — Progress Notes (Signed)
Received patient in bed to unit.  Alert and oriented.  Informed consent signed and in chart.   TX duration:3  Patient tolerated well.  Transported back to the room  Alert, without acute distress.  Hand-off given to patient's nurse.   Access used: left AVG Access issues: NONE  Total UF removed: 3L Medication(s) given: ARENESP IV IRON    06/01/23 1654  Vitals  Temp 98.3 F (36.8 C)  Temp Source Oral  BP (!) 170/75  MAP (mmHg) 101  BP Location Right Wrist  BP Method Automatic  Patient Position (if appropriate) Lying  Pulse Rate 88  Pulse Rate Source Monitor  ECG Heart Rate 86  Resp 17  Oxygen Therapy  SpO2 100 %  O2 Device Nasal Cannula  O2 Flow Rate (L/min) 3 L/min  During Treatment Monitoring  HD Safety Checks Performed Yes  Intra-Hemodialysis Comments Tx completed;Tolerated well  Dialysis Fluid Bolus Normal Saline  Bolus Amount (mL) 300 mL      Jeffrey [name] [facility]

## 2023-06-02 DIAGNOSIS — R0602 Shortness of breath: Secondary | ICD-10-CM | POA: Diagnosis not present

## 2023-06-02 LAB — GLUCOSE, CAPILLARY
Glucose-Capillary: 376 mg/dL — ABNORMAL HIGH (ref 70–99)
Glucose-Capillary: 417 mg/dL — ABNORMAL HIGH (ref 70–99)
Glucose-Capillary: 449 mg/dL — ABNORMAL HIGH (ref 70–99)
Glucose-Capillary: 398 mg/dL — ABNORMAL HIGH (ref 70–99)

## 2023-06-02 LAB — RENAL FUNCTION PANEL
Albumin: 3.3 g/dL — ABNORMAL LOW (ref 3.5–5.0)
Anion gap: 7 (ref 5–15)
BUN: 44 mg/dL — ABNORMAL HIGH (ref 8–23)
CO2: 29 mmol/L (ref 22–32)
Calcium: 7.6 mg/dL — ABNORMAL LOW (ref 8.9–10.3)
Chloride: 93 mmol/L — ABNORMAL LOW (ref 98–111)
Creatinine, Ser: 3.57 mg/dL — ABNORMAL HIGH (ref 0.61–1.24)
GFR, Estimated: 18 mL/min — ABNORMAL LOW (ref >60)
Glucose, Bld: 445 mg/dL — ABNORMAL HIGH (ref 70–99)
Phosphorus: 3.4 mg/dL (ref 2.5–4.6)
Potassium: 3.9 mmol/L (ref 3.5–5.1)
Sodium: 129 mmol/L — ABNORMAL LOW (ref 135–145)

## 2023-06-02 LAB — HEPATITIS B SURFACE ANTIBODY, QUANTITATIVE: Hep B S AB Quant (Post): 35.3 m[IU]/mL

## 2023-06-02 MED ORDER — IPRATROPIUM-ALBUTEROL 0.5-2.5 (3) MG/3ML IN SOLN
3.0000 mL | Freq: Four times a day (QID) | RESPIRATORY_TRACT | Status: DC | PRN
  Administered 2023-06-02: 3 mL via RESPIRATORY_TRACT

## 2023-06-02 MED ORDER — INSULIN ASPART 100 UNIT/ML IJ SOLN
0.0000 [IU] | Freq: Three times a day (TID) | INTRAMUSCULAR | Status: DC
  Administered 2023-06-02 (×3): 20 [IU] via SUBCUTANEOUS
  Administered 2023-06-03 – 2023-06-04 (×4): 15 [IU] via SUBCUTANEOUS
  Administered 2023-06-04: 7 [IU] via SUBCUTANEOUS

## 2023-06-02 MED ORDER — INSULIN GLARGINE-YFGN 100 UNIT/ML ~~LOC~~ SOLN
80.0000 [IU] | Freq: Every day | SUBCUTANEOUS | Status: DC
  Administered 2023-06-04: 80 [IU] via SUBCUTANEOUS
  Filled 2023-06-02 (×2): qty 0.8

## 2023-06-02 MED ORDER — INSULIN GLARGINE-YFGN 100 UNIT/ML ~~LOC~~ SOLN
40.0000 [IU] | Freq: Every day | SUBCUTANEOUS | Status: DC
  Administered 2023-06-02: 40 [IU] via SUBCUTANEOUS
  Filled 2023-06-02: qty 0.4

## 2023-06-02 MED ORDER — INSULIN GLARGINE-YFGN 100 UNIT/ML ~~LOC~~ SOLN
20.0000 [IU] | Freq: Once | SUBCUTANEOUS | Status: AC
  Administered 2023-06-02: 20 [IU] via SUBCUTANEOUS
  Filled 2023-06-02: qty 0.2

## 2023-06-02 MED ORDER — INSULIN ASPART 100 UNIT/ML IJ SOLN
5.0000 [IU] | Freq: Once | INTRAMUSCULAR | Status: AC
  Administered 2023-06-02: 5 [IU] via SUBCUTANEOUS

## 2023-06-02 NOTE — Progress Notes (Signed)
Transition of Care [facility]) - Inpatient Brief Assessment   Patient Details  Name: Jeffrey [name] MRN: [id number] Date of Birth: 1961/02/28  Transition of Care [facility]) CM/SW Contact:    Janae Bridgeman, RN Phone Number: 06/02/2023, 4:32 PM   Clinical Narrative: No TOC needs at this time   Transition of Care Asessment: Insurance and Status: (P) Insurance coverage has been reviewed Patient has primary care physician: (P) Yes Home environment has been reviewed: (P) Yes Prior level of function:: (P) Independent Prior/Current Home Services: (P) No current home services Social Determinants of Health Reivew: (P) SDOH reviewed no interventions necessary Readmission risk has been reviewed: (P) Yes Transition of care needs: (P) no transition of care needs at this time

## 2023-06-02 NOTE — Discharge Instructions (Addendum)
Dear Jeffrey [name],  Thank you for letting us participate in your care. You were hospitalized and diagnosed with Shortness of breath due to missing your hemodialysis session. You were treated with 3 rounds of hemodialysis and oxygen. We also actively managing your diabetes with long acting insulin. We highly recommend making it to your hemodialysis sessions Tuesday, Thursday, and Saturdays along with following up with your PCP to better control your diabetes.  POST-HOSPITAL & CARE INSTRUCTIONS Go to your follow up appointments (listed below)   DOCTOR'S APPOINTMENT   Future Appointments  Date Time Provider Department Center  06/12/2023  9:30 AM [facility]     Take care and be well!  Family Medicine Teaching Service Inpatient Team [place]  [facility]  [address] [place], [place] [address] [phone]

## 2023-06-02 NOTE — Progress Notes (Signed)
[facility] NEPHROLOGY PROGRESS NOTE  Assessment/ Plan: Pt is a 62 y.o. yo male ESRD on HD well-known to the practice from previous visits, discharged from outpatient HD who comes to ER for dialysis.  # Acute on chronic hypoxic respiratory failure/fluid overload: Received dialysis overnight with improvement of breathing.  Need more fluid removal.  Continue oxygen and bronchodilators per primary team.  # ESRD on HD: Missing dialysis often and has no outpatient HD center.  Underwent HD last night with 3.7 L UF.  He was fluid overload and rx HD overnight on 7/3 with 3.7L and then on 7/4 afternoon w/ 3L but had to givev back. He cramps easily.  He has AV graft for the access.  Plan next HD tomorrow.  # Hemodialysis vascular access. Pt had new LUA AVG and LIJ TDC placed on 04/07/23 by Dr Edilia Bo. TDC was removed on 05/23/23. Using AVG now.   # Anemia: Treated with iron and Aranesp.  Monitor hemoglobin.  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  Continue PhosLo.  # Hypertension/volume: UF with HD as discussed above.  Continue antihypertensives.  Need fluid restriction.  Subjective: Seen and examined.  He reports that his breathing is much better.  He completed HD Thur afternoon but had to given back bec of hypotension (took 3L out and gave ).  Objective Vital signs in last 24 hours: Vitals:   06/01/23 1750 06/01/23 1947 06/02/23 0424 06/02/23 0800  BP: (!) 141/75 (!) 151/77 (!) 146/77 (!) 154/76  Pulse: 87 88 74 74  Resp: 19   18  Temp: 99.3 F (37.4 C) 98.4 F (36.9 C) (!) 97.3 F (36.3 C) 97.7 F (36.5 C)  TempSrc:  Oral Oral Oral  SpO2: 100% 95% 100% 100%  Weight:      Height:       Weight change: -1.422 kg  Intake/Output Summary (Last 24 hours) at 06/02/2023 0935 Last data filed at 06/02/2023 0536 Gross per 24 hour  Intake 341.28 ml  Output 3300 ml  Net -2958.72 ml       Labs: RENAL PANEL Recent Labs  Lab 05/27/23 1058  05/31/23 2142 06/01/23 0030 06/01/23 1056 06/02/23 0435  NA 135 135 136 133* 129*  K 4.0 4.4 4.7 4.4 3.9  CL 95* 97* 93* 94* 93*  CO2 27  --  25 27 29   GLUCOSE 149* 407* 444* 330* 445*  BUN 68* 90* 82* 48* 44*  CREATININE 5.19* 5.20* 4.93* 3.66* 3.57*  CALCIUM 8.1*  --  7.8* 7.9* 7.6*  PHOS 4.1  --  5.1*  --  3.4  ALBUMIN 3.5  --  3.4* 3.7 3.3*    Liver Function Tests: Recent Labs  Lab 06/01/23 0030 06/01/23 1056 06/02/23 0435  AST  --  15  --   ALT  --  14  --   ALKPHOS  --  94  --   BILITOT  --  0.4  --   PROT  --  7.6  --   ALBUMIN 3.4* 3.7 3.3*   No results for input(s): "LIPASE", "AMYLASE" in the last 168 hours. No results for input(s): "AMMONIA" in the last 168 hours. CBC: Recent Labs    12/02/22 1547 12/03/22 1814 01/24/23 1035 01/26/23 0736 04/11/23 1440 04/18/23 0756 05/18/23 0854 05/23/23 0940 05/23/23 0942 05/27/23 1057 05/31/23 2142 06/01/23 0030  HGB  --    < > 9.0*   < > 7.2*   < > 8.2* 8.8*  --  8.8*  10.9* 8.4*  MCV  --    < > 88.1   < > 91.0   < > 88.9 88.9  --  89.6  --  90.8  FERRITIN 1,056*  --  935*  --  545*  --   --   --  451*  --   --   --   TIBC 209*  --  255  --  230*  --   --   --  225*  --   --   --   IRON 64  --  55  --  26*  --   --   --  26*  --   --   --    < > = values in this interval not displayed.    Cardiac Enzymes: No results for input(s): "CKTOTAL", "CKMB", "CKMBINDEX", "TROPONINI" in the last 168 hours. CBG: Recent Labs  Lab 06/01/23 1139 06/01/23 1759 06/01/23 2102 06/01/23 2212 06/02/23 0748  GLUCAP 353* 338* 375* 409* 376*    Iron Studies: No results for input(s): "IRON", "TIBC", "TRANSFERRIN", "FERRITIN" in the last 72 hours. Studies/Results: ECHOCARDIOGRAM COMPLETE  Result Date: 06/01/2023    ECHOCARDIOGRAM REPORT   Patient Name:   ERNEST HYNEMAN Date of Exam: 06/01/2023 Medical Rec #:  [id number]     Height:       65.0 in Accession #:    [id number]    Weight:       309.0 lb Date of Birth:  04-26-1961      BSA:          2.380 m Patient Age:    61 years      BP:           125/66 mmHg Patient Gender: M             HR:           98 bpm. Exam Location:  Inpatient Procedure: 2D Echo, Cardiac Doppler and Color Doppler Indications:    Dyspnea R06.00  History:        Patient has prior history of Echocardiogram examinations, most                 recent 11/08/2021. COPD; Risk Factors:Hypertension, Sleep Apnea                 and Diabetes. ESRD.  Sonographer:    Lucendia Herrlich Referring Phys: [id number] MARSHALL L CHAMBLISS IMPRESSIONS  1. No IV access for definity use.  2. Left ventricular ejection fraction, by estimation, is 55 to 60%. The left ventricle has normal function. The left ventricle has no regional wall motion abnormalities. There is mild left ventricular hypertrophy. Left ventricular diastolic parameters were normal.  3. Right ventricular systolic function is normal. The right ventricular size is normal.  4. Left atrial size was moderately dilated.  5. The mitral valve is abnormal. No evidence of mitral valve regurgitation. No evidence of mitral stenosis.  6. The aortic valve is tricuspid. There is mild calcification of the aortic valve. There is mild thickening of the aortic valve. Aortic valve regurgitation is not visualized. Aortic valve sclerosis is present, with no evidence of aortic valve stenosis.  7. The inferior vena cava is normal in size with greater than 50% respiratory variability, suggesting right atrial pressure of 3 mmHg. FINDINGS  Left Ventricle: Left ventricular ejection fraction, by estimation, is 55 to 60%. The left ventricle has normal function. The left ventricle has no regional wall motion abnormalities. The left ventricular  internal cavity size was normal in size. There is  mild left ventricular hypertrophy. Left ventricular diastolic parameters were normal. Right Ventricle: The right ventricular size is normal. No increase in right ventricular wall thickness. Right ventricular systolic  function is normal. Left Atrium: Left atrial size was moderately dilated. Right Atrium: Right atrial size was normal in size. Pericardium: There is no evidence of pericardial effusion. Mitral Valve: The mitral valve is abnormal. There is mild thickening of the mitral valve leaflet(s). There is mild calcification of the mitral valve leaflet(s). Mild mitral annular calcification. No evidence of mitral valve regurgitation. No evidence of mitral valve stenosis. Tricuspid Valve: The tricuspid valve is normal in structure. Tricuspid valve regurgitation is mild . No evidence of tricuspid stenosis. Aortic Valve: The aortic valve is tricuspid. There is mild calcification of the aortic valve. There is mild thickening of the aortic valve. Aortic valve regurgitation is not visualized. Aortic valve sclerosis is present, with no evidence of aortic valve stenosis. Aortic valve peak gradient measures 12.4 mmHg. Pulmonic Valve: The pulmonic valve was normal in structure. Pulmonic valve regurgitation is not visualized. No evidence of pulmonic stenosis. Aorta: The aortic root is normal in size and structure. Venous: The inferior vena cava is normal in size with greater than 50% respiratory variability, suggesting right atrial pressure of 3 mmHg. IAS/Shunts: No atrial level shunt detected by color flow Doppler. Additional Comments: No IV access for definity use.  LEFT VENTRICLE PLAX 2D LVIDd:         4.60 cm   Diastology LVIDs:         3.40 cm   LV e' medial:    9.96 cm/s LV PW:         1.15 cm   LV E/e' medial:  10.8 LV IVS:        1.20 cm   LV e' lateral:   9.32 cm/s LVOT diam:     2.20 cm   LV E/e' lateral: 11.6 LV SV:         69 LV SV Index:   29 LVOT Area:     3.80 cm  RIGHT VENTRICLE TAPSE (M-mode): 2.2 cm LEFT ATRIUM             Index        RIGHT ATRIUM           Index LA diam:        4.40 cm 1.85 cm/m   RA Area:     18.60 cm LA Vol (A2C):   69.7 ml 29.29 ml/m  RA Volume:   56.10 ml  23.57 ml/m LA Vol (A4C):   98.3 ml  41.31 ml/m LA Biplane Vol: 93.8 ml 39.41 ml/m  AORTIC VALVE AV Area (Vmax): 2.19 cm AV Vmax:        176.00 cm/s AV Peak Grad:   12.4 mmHg LVOT Vmax:      101.50 cm/s LVOT Vmean:     64.250 cm/s LVOT VTI:       0.182 m  AORTA Ao Root diam: 3.20 cm Ao Asc diam:  3.30 cm MITRAL VALVE                TRICUSPID VALVE MV Area (PHT): 4.21 cm     TR Peak grad:   10.5 mmHg MV Decel Time: 180 msec     TR Vmax:        162.00 cm/s MR Peak grad: 31.4 mmHg MR Vmax:      280.00 cm/s  SHUNTS MV E velocity: 108.00 cm/s  Systemic VTI:  0.18 m MV A velocity: 130.00 cm/s  Systemic Diam: 2.20 cm MV E/A ratio:  0.83 Charlton Haws MD Electronically signed by Charlton Haws MD Signature Date/Time: 06/01/2023/10:20:29 AM    Final    DG Chest Port 1 View  Result Date: 05/31/2023 CLINICAL DATA:  Shortness of breath, epistaxis EXAM: PORTABLE CHEST 1 VIEW COMPARISON:  04/22/2023 FINDINGS: Lungs are essentially clear. Mild perihilar fullness without frank interstitial edema. No pleural effusion or pneumothorax. Right upper mediastinal vascular stent. Cardiomegaly. IMPRESSION: Cardiomegaly.  No frank interstitial edema. Electronically Signed   By: Charline Bills M.D.   On: 05/31/2023 20:55    Medications: Infusions:  iron sucrose Stopped (06/01/23 1529)    Scheduled Medications:  amLODipine  10 mg Oral q morning   aspirin EC  81 mg Oral Daily   atorvastatin  40 mg Oral Daily   calcium acetate  1,334 mg Oral TID with meals   carvedilol  12.5 mg Oral BID WC   Chlorhexidine Gluconate Cloth  6 each Topical Daily   Chlorhexidine Gluconate Cloth  6 each Topical Q0600   cholecalciferol  2,000 Units Oral Daily   clopidogrel  75 mg Oral Daily   darbepoetin (ARANESP) injection - DIALYSIS  100 mcg Subcutaneous Q Thu-1800   heparin  5,000 Units Subcutaneous Q8H   insulin aspart  0-20 Units Subcutaneous TID WC   insulin glargine-yfgn  40 Units Subcutaneous Daily   metolazone  10 mg Oral Daily   nortriptyline  20 mg Oral QHS    pregabalin  75 mg Oral Q T,Th,Sa-HD   sertraline  100 mg Oral Daily   tamsulosin  0.4 mg Oral Daily   traZODone  150 mg Oral QHS    have reviewed scheduled and prn medications.  Physical Exam: General:NAD, comfortable Heart:RRR, s1s2 nl Lungs: Reduced breath sound bilateral, able to speak full sentence Abdomen:soft, Non-tender, non-distended Extremities: Bilateral total lower extremities has chronic pitting edema ++. Dialysis Access: AV graft has good thrill and bruit.  Maliyah Willets, Tiler W 06/02/2023,9:35 AM  LOS: 1 day

## 2023-06-02 NOTE — Assessment & Plan Note (Addendum)
Tolerated two rounds of HD yesterday over the night and during the day. Nephrology recommended HD tomorrow 7/6 on his regular schedule. - Continue PhosLo - Nephrology consulted, appreciate recs

## 2023-06-02 NOTE — Progress Notes (Cosign Needed Addendum)
Daily Progress Note Intern Pager: [phone]  Patient name: Jeffrey [name] Medical record number: [id number] Date of birth: 1961-05-04 Age: 62 y.o. Gender: male  Primary Care Provider: Knox Royalty, MD Consultants: Nephrology Code Status: Full Code  Pt Overview and Major Events to Date:  7/4: Admitted, HD overnight and during the day  Assessment and Plan: Jeffrey [name] is a 62 year old male with past medical history of ESRD on HD, CHF, HTN, HLD, BPH, and depression admitted for COPD exacerbation due to volume overload in the setting of missing dialysis and requiring more oxygen than baseline (3 L nasal cannula at home).  Hospital Problem List      Hospital     * (Principal) Shortness of breath     On nasal cannula, maintain saturations between 88 to 92%.  Likely due  to hypervolemia due to missed dialysis session. Currently he is on 4 L [place]. - Monitor oxygen saturation  - Wean [place] to baseline 3 L nasal cannula - Continue nasal saline rinse - PT/OT        Type 2 diabetes mellitus with hyperlipidemia (HCC)     CBGs elevated to 417. Takes Humulin 70-125 units BID at home. - 40u Semglee + Resistant SSI - If his CBGs continue to be elevated, will titrate up        Anemia of chronic disease     Hemoglobin stable at 8.4 - Management per nephrology        ESRD on dialysis [facility])     Tolerated two rounds of HD yesterday over the night and during the day.  Nephrology recommended HD tomorrow 7/6 on his regular schedule. - Continue PhosLo - Nephrology consulted, appreciate recs      FEN/GI: Carb modified diet PPx: Heparin Dispo: pending HD tomorrow  Subjective:  Patient is doing well this morning.  Requiring 4 L of oxygen, baseline is 3 L. He reports having some dried up blood in his nose which the saline rinse helped, but still feels congested. Tolerated HD well during the day yesterday and it was about 3 L taken off. Denies wheezing, chest discomfort, chest pain,  palpitations, headache, abdominal pain.   Objective: Temp:  [97.3 F (36.3 C)-99.3 F (37.4 C)] 97.7 F (36.5 C) (07/05 0800) Pulse Rate:  [74-88] 74 (07/05 0800) Resp:  [17-22] 18 (07/05 0800) BP: (141-170)/(53-77) 154/76 (07/05 0800) SpO2:  [95 %-100 %] 100 % (07/05 1045) Weight:  [137.3 kg] 137.3 kg (07/04 1701)  Physical Exam: General: NAD, awake and alert Cardiovascular: RRR. Transmitted fistula sounds Respiratory: Normal WOB on 4 L [place]. Mild rhonchorous sounds in bases, no crackles. Abdomen: scar from pancreatic surgery, soft, nontender, nondistended Extremities: BLE edema at baseline, L AKA Neuro: A&Ox3. No focal neurological deficits.  Laboratory: Most recent CBC Lab Results  Component Value Date   WBC 6.0 06/01/2023   HGB 8.4 (L) 06/01/2023   HCT 27.6 (L) 06/01/2023   MCV 90.8 06/01/2023   PLT 128 (L) 06/01/2023   Most recent BMP    Latest Ref Rng & Units 06/02/2023    4:35 AM  BMP  Glucose 70 - 99 mg/dL 562   BUN 8 - 23 mg/dL 44   Creatinine 1.30 - 1.24 mg/dL 8.65   Sodium 784 - 696 mmol/L 129   Potassium 3.5 - 5.1 mmol/L 3.9   Chloride 98 - 111 mmol/L 93   CO2 22 - 32 mmol/L 29   Calcium 8.9 - 10.3 mg/dL 7.6  Other pertinent labs none   Imaging/Diagnostic Tests: No new imaging.  Fortunato Curling, DO 06/02/2023, 4:01 PM PGY-1, [facility] Family Medicine  FPTS Intern pager: [phone], text pages welcome Secure chat group [facility] Teaching Service

## 2023-06-02 NOTE — Assessment & Plan Note (Signed)
CBGs elevated. Takes Humulin 70-125 units BID at home. - 80u Semglee + Resistant SSI

## 2023-06-02 NOTE — Assessment & Plan Note (Signed)
On nasal cannula, maintain saturations between 88 to 92%.  Likely due to hypervolemia due to missed dialysis session. Currently he is on 4 L [place]. - Monitor oxygen saturation  - Wean [place] to baseline 3 L nasal cannula - Continue nasal saline rinse - PT/OT

## 2023-06-02 NOTE — Assessment & Plan Note (Signed)
Hemoglobin stable at 8.4 -Management per nephrology 

## 2023-06-02 NOTE — Assessment & Plan Note (Addendum)
CBGs elevated to 417. Takes Humulin 70-125 units BID at home. - 20u Semglee + Resistant SSI - If his CBGs continue to be elevated, will titrate up

## 2023-06-02 NOTE — Evaluation (Signed)
Physical Therapy Evaluation/ Discharge Patient Details Name: Jeffrey [name] MRN: [id number] DOB: [birth year] Today's Date: 06/02/2023  History of Present Illness  62 yo male admitted 7/3 with SOB likely due to hypervolemia in setting of missed HD. PMH: ESRD on HD, L AKA, CHF, HTN, HLD, BPH, depression, COPD on 3 L O2, DM.  Clinical Impression  Pt willing to get OOB for breakfast and able to state he does have prosthetic but he is using it to grow tomatoes in. Pt lives with family with PCA 4days a week and performs all mobility at squat pivot level. Pt reports baseline function today with setup for equipment but no physical assist to pivot. Pt does endorse falls with ADLs and states he keeps his cell phone close and calls 911 when this occurs. Pt with all needed DME and declines need for further therapy intervention. Pt encouraged to be OOB throughout the day with nursing supervision. Will sign off with pt aware and agreeable.      97% 4L    Assistance Recommended at Discharge Intermittent Supervision/Assistance  If plan is discharge home, recommend the following:  Can travel by private vehicle  Assistance with cooking/housework;Assist for transportation;A little help with bathing/dressing/bathroom        Equipment Recommendations None recommended by PT  Recommendations for Other Services       Functional Status Assessment Patient has not had a recent decline in their functional status     Precautions / Restrictions Precautions Precautions: Fall;Other (comment) Precaution Comments: L AKA without prosthetic; 3 L O2 baseline      Mobility  Bed Mobility Overal bed mobility: Modified Independent             General bed mobility comments: HOB 35 degrees with rail, pt able to pivot to EOb without assist, reports use of hospital bed at home    Transfers Overall transfer level: Needs assistance   Transfers: Bed to chair/wheelchair/BSC       Squat pivot transfers:  Supervision     General transfer comment: setup for chair beside bed with pt able to pivot to right without physical assist. pt reports baseline functional status without need for further mobility    Ambulation/Gait               General Gait Details: non ambulatory at baseline  Stairs            Wheelchair Mobility     Tilt Bed    Modified Rankin (Stroke Patients Only)       Balance Overall balance assessment: History of Falls                                           Pertinent Vitals/Pain Pain Assessment Pain Assessment: No/denies pain    Home Living Family/patient expects to be discharged to:: Private residence Living Arrangements: Spouse/significant other Available Help at Discharge: Family;Personal care attendant;Available PRN/intermittently Type of Home: House Home Access: Ramped entrance       Home Layout: One level Home Equipment: BSC/3in1;Wheelchair - power;Wheelchair - manual;Hospital bed Additional Comments: pt reports wife with hx of heart transplant, daughter in and out of home.    Prior Function Prior Level of Function : Needs assist             Mobility Comments: typically stand pivot vs scoot to power chair ADLs Comments: Uses BSC as power chair  will not fit in bathroom doorway, sponge bathes. Has aide assist 4 days/wk for 2 hours who assists with bathing/dressing. reports some difficulty reaching to wipe bottom recently. pt not forthcoming with all information/assist available. uses public transportation for MD appts and HD. Several falls with ADLs     Hand Dominance   Dominant Hand: Right    Extremity/Trunk Assessment   Upper Extremity Assessment Upper Extremity Assessment: Generalized weakness    Lower Extremity Assessment Lower Extremity Assessment: LLE deficits/detail LLE Deficits / Details: AKA    Cervical / Trunk Assessment Cervical / Trunk Assessment: Normal  Communication   Communication: No  difficulties  Cognition Arousal/Alertness: Awake/alert Behavior During Therapy: WFL for tasks assessed/performed Overall Cognitive Status: Within Functional Limits for tasks assessed                                 General Comments: pt answering with minimal information for PLOF and reports he can manage everything on his own        General Comments      Exercises     Assessment/Plan    PT Assessment Patient does not need any further PT services  PT Problem List         PT Treatment Interventions      PT Goals (Current goals can be found in the Care Plan section)  Acute Rehab PT Goals PT Goal Formulation: All assessment and education complete, DC therapy    Frequency       Co-evaluation               AM-PAC PT "6 Clicks" Mobility  Outcome Measure Help needed turning from your back to your side while in a flat bed without using bedrails?: None Help needed moving from lying on your back to sitting on the side of a flat bed without using bedrails?: None Help needed moving to and from a bed to a chair (including a wheelchair)?: A Little Help needed standing up from a chair using your arms (e.g., wheelchair or bedside chair)?: A Little Help needed to walk in hospital room?: Total Help needed climbing 3-5 steps with a railing? : Total 6 Click Score: 16    End of Session   Activity Tolerance: Patient tolerated treatment well Patient left: in chair;with call bell/phone within reach;with chair alarm set;with nursing/sitter in room Nurse Communication: Mobility status PT Visit Diagnosis: Other abnormalities of gait and mobility (R26.89)    Time: 1610-9604 PT Time Calculation (min) (ACUTE ONLY): 15 min   Charges:   PT Evaluation $PT Eval Low Complexity: 1 Low   PT General Charges $$ ACUTE PT VISIT: 1 Visit         Merryl Hacker, PT Acute Rehabilitation Services Office: [phone]   Enedina Finner Dejon Jungman 06/02/2023, 9:59 AM

## 2023-06-02 NOTE — Inpatient Diabetes Management (Signed)
Inpatient Diabetes Program Recommendations  AACE/ADA: New Consensus Statement on Inpatient Glycemic Control (2015)  Target Ranges:  Prepandial:   less than 140 mg/dL      Peak postprandial:   less than 180 mg/dL (1-2 hours)      Critically ill patients:  140 - 180 mg/dL   Lab Results  Component Value Date   GLUCAP 417 (H) 06/02/2023   HGBA1C 9.3 (H) 06/01/2023    Review of Glycemic Control  Diabetes history: DM 2 Outpatient Diabetes medications: Humulin R U-500 <100- o units, 100-300 - 125 units, >300 - 140 units bid Current orders for Inpatient glycemic control:  Semglee 40 units Daily  A1c 9.3% on 7/4  Inpatient Diabetes Program Recommendations:    Spoke with pt at bedside regarding A1c level of 9.3% and glucose control at home. Pt reports seeing an endocrinologist as needed and not necessarily on a scheduled basis for DM management. Pt on Dialysis and reports on the day after dialysis in the mornings he would often times have hypoglycemia in the 50's and have seen 40's. Pt expresses wanting to be on his home insulin doses while in the hospital. Pt reports his A1c being "not that bad". Pt is encouraged to follow up with his endocrinologist.  Spoke with Family Medicine regarding pt desire to restart home insulin regimen.  Thanks,  Christena Deem RN, MSN, BC-ADM Inpatient Diabetes Coordinator Team Pager [phone] (8a-5p)

## 2023-06-03 DIAGNOSIS — R0602 Shortness of breath: Secondary | ICD-10-CM | POA: Diagnosis not present

## 2023-06-03 LAB — RENAL FUNCTION PANEL
Albumin: 3.4 g/dL — ABNORMAL LOW (ref 3.5–5.0)
Anion gap: 17 — ABNORMAL HIGH (ref 5–15)
BUN: 61 mg/dL — ABNORMAL HIGH (ref 8–23)
CO2: 27 mmol/L (ref 22–32)
Calcium: 8.2 mg/dL — ABNORMAL LOW (ref 8.9–10.3)
Chloride: 90 mmol/L — ABNORMAL LOW (ref 98–111)
Creatinine, Ser: 4.19 mg/dL — ABNORMAL HIGH (ref 0.61–1.24)
GFR, Estimated: 15 mL/min — ABNORMAL LOW (ref >60)
Glucose, Bld: 279 mg/dL — ABNORMAL HIGH (ref 70–99)
Phosphorus: 4.6 mg/dL (ref 2.5–4.6)
Potassium: 4.2 mmol/L (ref 3.5–5.1)
Sodium: 134 mmol/L — ABNORMAL LOW (ref 135–145)

## 2023-06-03 LAB — GLUCOSE, CAPILLARY
Glucose-Capillary: 263 mg/dL — ABNORMAL HIGH (ref 70–99)
Glucose-Capillary: 185 mg/dL — ABNORMAL HIGH (ref 70–99)
Glucose-Capillary: 301 mg/dL — ABNORMAL HIGH (ref 70–99)
Glucose-Capillary: 310 mg/dL — ABNORMAL HIGH (ref 70–99)

## 2023-06-03 NOTE — Progress Notes (Signed)
[facility] NEPHROLOGY PROGRESS NOTE  Assessment/ Plan: Pt is a 62 y.o. yo male ESRD on HD well-known to the practice from previous visits, discharged from outpatient HD who comes to ER for dialysis.  # Acute on chronic hypoxic respiratory failure/fluid overload: Received dialysis overnight with improvement of breathing.  Need more fluid removal.  Continue oxygen and bronchodilators per primary team.  # ESRD on HD: Missing dialysis often and has no outpatient HD center.  Underwent HD last night with 3.7 L UF.  He was fluid overload and rx HD overnight on 7/3 with 3.7L and then on 7/4 afternoon w/ 3L but had to givev back. He cramps easily.  He has AV graft for the access.  HD today  # Hemodialysis vascular access. Pt had new LUA AVG and LIJ TDC placed on 04/07/23 by Dr Edilia Bo. TDC was removed on 05/23/23. Using AVG now.   # Anemia: Treated with iron and Aranesp.  Monitor hemoglobin.  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  Continue PhosLo.  # Hypertension/volume: UF with HD as discussed above.  Continue antihypertensives.  Need fluid restriction.  Subjective: Seen in dialysis unit -attempting 3L UF goal. No new complaints this am.    Objective Vital signs in last 24 hours: Vitals:   06/03/23 0815 06/03/23 0829 06/03/23 0900 06/03/23 0933  BP: (!) 149/68 (!) 151/76 (!) 162/73 (!) 151/74  Pulse: 81 80 81 81  Resp: 18 11 14 11   Temp: 97.9 F (36.6 C)     TempSrc: Oral     SpO2: 100% 100% 99% 99%  Weight: (!) 137.3 kg     Height:       Weight change:   Intake/Output Summary (Last 24 hours) at 06/03/2023 1006 Last data filed at 06/03/2023 0801 Gross per 24 hour  Intake 236 ml  Output 800 ml  Net -564 ml        Labs: RENAL PANEL Recent Labs  Lab 05/27/23 1058 05/31/23 2142 06/01/23 0030 06/01/23 1056 06/02/23 0435 06/03/23 0837  NA 135 135 136 133* 129* 134*  K 4.0 4.4 4.7 4.4 3.9 4.2  CL 95* 97* 93* 94* 93* 90*  CO2 27  --  25 27 29  27   GLUCOSE 149* 407* 444* 330* 445* 279*  BUN 68* 90* 82* 48* 44* 61*  CREATININE 5.19* 5.20* 4.93* 3.66* 3.57* 4.19*  CALCIUM 8.1*  --  7.8* 7.9* 7.6* 8.2*  PHOS 4.1  --  5.1*  --  3.4 4.6  ALBUMIN 3.5  --  3.4* 3.7 3.3* 3.4*     Liver Function Tests: Recent Labs  Lab 06/01/23 1056 06/02/23 0435 06/03/23 0837  AST 15  --   --   ALT 14  --   --   ALKPHOS 94  --   --   BILITOT 0.4  --   --   PROT 7.6  --   --   ALBUMIN 3.7 3.3* 3.4*    No results for input(s): "LIPASE", "AMYLASE" in the last 168 hours. No results for input(s): "AMMONIA" in the last 168 hours. CBC: Recent Labs    12/02/22 1547 12/03/22 1814 01/24/23 1035 01/26/23 0736 04/11/23 1440 04/18/23 0756 05/18/23 0854 05/23/23 0940 05/23/23 0942 05/27/23 1057 05/31/23 2142 06/01/23 0030  HGB  --    < > 9.0*   < > 7.2*   < > 8.2* 8.8*  --  8.8* 10.9* 8.4*  MCV  --    < > 88.1   < >  91.0   < > 88.9 88.9  --  89.6  --  90.8  FERRITIN 1,056*  --  935*  --  545*  --   --   --  451*  --   --   --   TIBC 209*  --  255  --  230*  --   --   --  225*  --   --   --   IRON 64  --  55  --  26*  --   --   --  26*  --   --   --    < > = values in this interval not displayed.     Cardiac Enzymes: No results for input(s): "CKTOTAL", "CKMB", "CKMBINDEX", "TROPONINI" in the last 168 hours. CBG: Recent Labs  Lab 06/02/23 0748 06/02/23 1150 06/02/23 1619 06/02/23 2123 06/03/23 0746  GLUCAP 376* 417* 449* 398* 263*     Iron Studies: No results for input(s): "IRON", "TIBC", "TRANSFERRIN", "FERRITIN" in the last 72 hours. Studies/Results: No results found.  Medications: Infusions:  iron sucrose Stopped (06/01/23 1529)    Scheduled Medications:  amLODipine  10 mg Oral q morning   aspirin EC  81 mg Oral Daily   atorvastatin  40 mg Oral Daily   calcium acetate  1,334 mg Oral TID with meals   carvedilol  12.5 mg Oral BID WC   Chlorhexidine Gluconate Cloth  6 each Topical Daily   Chlorhexidine Gluconate Cloth   6 each Topical Q0600   cholecalciferol  2,000 Units Oral Daily   clopidogrel  75 mg Oral Daily   darbepoetin (ARANESP) injection - DIALYSIS  100 mcg Subcutaneous Q Thu-1800   heparin  5,000 Units Subcutaneous Q8H   insulin aspart  0-20 Units Subcutaneous TID WC   insulin glargine-yfgn  80 Units Subcutaneous Daily   metolazone  10 mg Oral Daily   nortriptyline  20 mg Oral QHS   pregabalin  75 mg Oral Q T,Th,Sa-HD   sertraline  100 mg Oral Daily   tamsulosin  0.4 mg Oral Daily   traZODone  150 mg Oral QHS    have reviewed scheduled and prn medications.  Physical Exam: General:NAD, comfortable Heart:RRR, s1s2 nl Lungs: Reduced breath sound bilateral, able to speak full sentence Abdomen:soft, Non-tender, non-distended Extremities: Bilateral total lower extremities has chronic pitting edema ++. Dialysis Access: AV graft has good thrill and bruit.  Tomasa Blase PA-C [facility] 06/03/2023,10:06 AM

## 2023-06-03 NOTE — Plan of Care (Signed)

## 2023-06-03 NOTE — Discharge Summary (Signed)
Family Medicine Teaching [facility] Discharge Summary  Patient name: Jeffrey [name] Medical record number: [id number] Date of birth: May 05, 1961 Age: 62 y.o. Gender: male Date of Admission: 05/31/2023  Date of Discharge: 06/04/23  Admitting Physician: Darral Dash, DO  Primary Care Provider: Knox Royalty, MD Consultants: Nephrology   Indication for Hospitalization: Dyspnea 2/2 to Volume Overload   Brief Hospital Course:  Jeffrey [name] is a 62 y.o.male with a history of ESRD on HD (TTS), hypertension, hyperlipidemia, PAD s/p AKA who was admitted to the [facility] teaching Service at [facility] for dyspnea. His hospital course is detailed below:  Dyspnea/Hypervolemia in setting of ESRD with missed HD session In the ED, patient presented with hypervolemia and shortness of breath in setting of missed HD session.  Initial blood work with BUN 90, creatinine 5.20, glucose 407, hemoglobin 8.4.  He was hemodynamically stable.  Chest x-ray showed mild perihilar fullness, but no frank edema.  He initially required nonrebreather mask.  After his HD session, when he was placed on 4 L nasal cannula he maintained SpO2 98%. He completed an additional 2 sessions of dialysis and returned to his oxygen requirement baseline (3L). Reached out to renal navigator to inquire about his outpatient HD schedule. He was dismissed from his office, so we will continue to come to the ED to get HD Tuesday, Thursday, Saturday.  Type 2 diabetes mellitus Patient had elevated blood sugars in the hospital. A1c was 9.3 check on 06/01/23. Per patient request, we continued his home Humulin and instructed him to follow up with his PCP outpatient.   Anemia of Chronic Disease:  While inpatient his Hgb dropped to 8.4. Per nephrology, he received Iv iron and Aranesp with instructions to monitor Hgb.    Other chronic conditions were medically managed with home medications and formulary alternatives as necessary  (depression, BPH)  PCP Follow-up Recommendations: Patient reports forming his own regimen for insulin administration at home. It may be beneficial to review his insulin regimen and advise appropriate changes to have better glycemic control.  Recheck CBC outpatient.   Discharge Diagnoses/Problem List:  Principal Problem:   Shortness of breath Active Problems:   ESRD on dialysis (HCC)   Type 2 diabetes mellitus with hyperlipidemia (HCC)   Anemia of chronic disease   Disposition: home  Discharge Condition: stable, returned to baseline   Discharge Exam:   General: NAD, pleasant, able to participate in exam Cardiac: RRR, no murmurs auscultated Respiratory: CTAB, normal WOB on supplemental O2 Abdomen: soft, non-tender, non-distended, normoactive bowel sounds Extremities: warm and well perfused, no edema or cyanosis Skin: warm and dry, no rashes noted Neuro: alert, no obvious focal deficits, speech normal Psych: Normal affect and mood   Significant Procedures: None   Significant Labs and Imaging:  No results for input(s): "WBC", "HGB", "HCT", "PLT" in the last 48 hours. Recent Labs  Lab 06/03/23 0837  NA 134*  K 4.2  CL 90*  CO2 27  GLUCOSE 279*  BUN 61*  CREATININE 4.19*  CALCIUM 8.2*  PHOS 4.6  ALBUMIN 3.4*    Results/Tests Pending at Time of Discharge: none   Discharge Medications:  Allergies as of 06/04/2023       Reactions   Actos [pioglitazone] Anaphylaxis, Rash   Dexmedetomidine Nausea And Vomiting, Other (See Comments)   (Precedex) Dose-limiting bradycardia   Ibuprofen Shortness Of Breath, Swelling, Other (See Comments)   Pt tolerates aspirin   Tomato Anaphylaxis, Other (See Comments)   Only allergic  to RAW tomatoes   Wellbutrin [bupropion] Swelling        Medication List     TAKE these medications    amLODipine 10 MG tablet Commonly known as: NORVASC Take 10 mg by mouth every morning.   Aspirin Low Dose 81 MG tablet Generic drug: aspirin  EC Take 81 mg by mouth in the morning.   atorvastatin 40 MG tablet Commonly known as: LIPITOR Take 40 mg by mouth in the morning.   calcium acetate 667 MG capsule Commonly known as: PHOSLO Take 2 capsules (1,334 mg total) by mouth with breakfast, with lunch, and with evening meal.   calcium carbonate (dosed in mg elemental calcium) 1250 MG/5ML Susp Take 5 mLs (500 mg of elemental calcium total) by mouth every 6 (six) hours as needed for indigestion.   carvedilol 12.5 MG tablet Commonly known as: COREG Take 12.5 mg by mouth 2 (two) times daily with a meal.   clopidogrel 75 MG tablet Commonly known as: PLAVIX Take 1 tablet (75 mg total) by mouth daily.   cyclobenzaprine 10 MG tablet Commonly known as: FLEXERIL Take 10 mg by mouth 3 (three) times daily as needed for muscle spasms.   diazepam 5 MG tablet Commonly known as: VALIUM Take 5 mg by mouth 2 (two) times daily as needed for muscle spasms.   HumuLIN R U-500 KwikPen 500 UNIT/ML KwikPen Generic drug: insulin regular human CONCENTRATED Inject 45 Units into the skin daily with supper. What changed:  how much to take when to take this   metolazone 10 MG tablet Commonly known as: ZAROXOLYN Take 10 mg by mouth daily.   nortriptyline 10 MG capsule Commonly known as: PAMELOR Take 20 mg by mouth at bedtime.   oxyCODONE 5 MG immediate release tablet Commonly known as: Roxicodone Take 1 tablet (5 mg total) by mouth every 8 (eight) hours as needed. What changed: reasons to take this   pantoprazole 40 MG tablet Commonly known as: PROTONIX Take 40 mg by mouth daily before breakfast.   pregabalin 75 MG capsule Commonly known as: LYRICA Take 1 capsule (75 mg total) by mouth See admin instructions. Daily.  Give after dialysis on dialysis days.   sertraline 100 MG tablet Commonly known as: ZOLOFT Take 100 mg by mouth in the morning.   tamsulosin 0.4 MG Caps capsule Commonly known as: FLOMAX Take 1 capsule (0.4 mg  total) by mouth daily.   torsemide 20 MG tablet Commonly known as: DEMADEX Take 120 mg by mouth 2 (two) times daily.   traZODone 150 MG tablet Commonly known as: DESYREL Take 150 mg by mouth at bedtime.   Vitamin D 50 MCG (2000 UT) Caps Take 2,000 Units by mouth daily.        Discharge Instructions: Please refer to Patient Instructions section of EMR for full details.  Patient was counseled important signs and symptoms that should prompt return to medical care, changes in medications, dietary instructions, activity restrictions, and follow up appointments.   Follow-Up Appointments:  Follow-up Information     Knox Royalty, MD Follow up in 2 day(s).   Specialty: Family Medicine Contact information: [address] [place] [address] [phone]                 Vonna Drafts, MD 06/04/2023, 6:51 AM PGY-2, [facility] Family Medicine

## 2023-06-03 NOTE — Plan of Care (Addendum)
A/ox4. Remains on 4L nasal cannula. No complaints of pain this shift. ACHS, blood sugars continue to be in the 300-400 range. Up with standby to contact guard assist. No overnight issues.   Problem: Education: Goal: Knowledge of General Education information will improve Description: Including pain rating scale, medication(s)/side effects and non-pharmacologic comfort measures Outcome: Progressing   Problem: Health Behavior/Discharge Planning: Goal: Ability to manage health-related needs will improve Outcome: Progressing   Problem: Clinical Measurements: Goal: Ability to maintain clinical measurements within normal limits will improve Outcome: Progressing Goal: Will remain free from infection Outcome: Progressing Goal: Diagnostic test results will improve Outcome: Progressing Goal: Respiratory complications will improve Outcome: Progressing Goal: Cardiovascular complication will be avoided Outcome: Progressing   Problem: Activity: Goal: Risk for activity intolerance will decrease Outcome: Progressing   Problem: Nutrition: Goal: Adequate nutrition will be maintained Outcome: Progressing   Problem: Coping: Goal: Level of anxiety will decrease Outcome: Progressing   Problem: Elimination: Goal: Will not experience complications related to bowel motility Outcome: Progressing Goal: Will not experience complications related to urinary retention Outcome: Progressing   Problem: Pain Managment: Goal: General experience of comfort will improve Outcome: Progressing   Problem: Safety: Goal: Ability to remain free from injury will improve Outcome: Progressing   Problem: Skin Integrity: Goal: Risk for impaired skin integrity will decrease Outcome: Progressing   Problem: Education: Goal: Ability to describe self-care measures that may prevent or decrease complications (Diabetes Survival Skills Education) will improve Outcome: Progressing Goal: Individualized Educational  Video(s) Outcome: Progressing   Problem: Coping: Goal: Ability to adjust to condition or change in health will improve Outcome: Progressing   Problem: Fluid Volume: Goal: Ability to maintain a balanced intake and output will improve Outcome: Progressing   Problem: Health Behavior/Discharge Planning: Goal: Ability to identify and utilize available resources and services will improve Outcome: Progressing Goal: Ability to manage health-related needs will improve Outcome: Progressing   Problem: Metabolic: Goal: Ability to maintain appropriate glucose levels will improve Outcome: Progressing   Problem: Nutritional: Goal: Maintenance of adequate nutrition will improve Outcome: Progressing Goal: Progress toward achieving an optimal weight will improve Outcome: Progressing   Problem: Skin Integrity: Goal: Risk for impaired skin integrity will decrease Outcome: Progressing   Problem: Tissue Perfusion: Goal: Adequacy of tissue perfusion will improve Outcome: Progressing

## 2023-06-03 NOTE — Progress Notes (Signed)
Daily Progress Note Intern Pager: [phone]  Patient name: Jeffrey [name] Medical record number: [id number] Date of birth: 1961-07-07 Age: 62 y.o. Gender: male  Primary Care Provider: Knox Royalty, MD Consultants: Nephrology  Code Status: Full code  Pt Overview and Major Events to Date:  7/4: Admitted, HD overnight and during the day  7/6: Continued HD   Assessment and Plan:  Jeffrey [name] is a 62 year old male with past medical history of ESRD on HD, CHF, HTN, HLD, BPH, and depression admitted for COPD exacerbation due to volume overload in the setting of missing dialysis and requiring more oxygen than baseline (3 L nasal cannula at home).   Patient stayed on 4-5L O2 overnight. Otherwise he has returned to respiratory baseline. He will stay for HD to remain on his normal schedule. Pending HD, patient is medically stable to return home. Will most likely continue home Humulin at discharge at patient preference.  Hospital Problem List      Hospital     * (Principal) Shortness of breath     Stable. Elevated to 5L overnight. Will wean to 3 L home O2 today.  - Monitor oxygen saturation  - Wean [place] to baseline 3 L nasal cannula - Continue nasal saline rinse        Type 2 diabetes mellitus with hyperlipidemia (HCC)     CBGs elevated. Takes Humulin 70-125 units BID at home. - 80u Semglee + Resistant SSI        Anemia of chronic disease     Stable.  - Management per nephrology        ESRD on dialysis [facility])     Tolerated two rounds of HD yesterday over the night and during the day.  Nephrology recommended HD tomorrow 7/6 on his regular schedule. - Continue PhosLo - Nephrology consulted, appreciate recs       FEN/GI: carb modified  PPx: Heparin  Dispo:Home today. Barriers include HD session.   Subjective:  NAEO, patient resting comfortably.   Objective: Temp:  [97.8 F (36.6 C)-98.3 F (36.8 C)] 97.8 F (36.6 C) (07/06 0736) Pulse Rate:  [72-77] 77 (07/06  0736) Resp:  [18] 18 (07/06 0736) BP: (120-156)/(59-99) 156/99 (07/06 0736) SpO2:  [97 %-100 %] 100 % (07/06 0736) Physical Exam: Chronically ill-appearing, no acute distress Cardio: Regular rate, regular rhythm, no murmurs on exam. Pulm: Clear, no wheezing, no crackles. No increased work of breathing Abdominal: bowel sounds present, soft, non-tender, non-distended Extremities: no peripheral edema  Neuro: alert and oriented x3, speech normal in content, no facial asymmetry, strength intact and equal bilaterally in UE and LE, pupils equal and reactive to light.  Psych:  Cognition and judgment appear intact. Alert, communicative  and cooperative with normal attention span and concentration. No apparent delusions, illusions, hallucinations   Laboratory: Most recent CBC Lab Results  Component Value Date   WBC 6.0 06/01/2023   HGB 8.4 (L) 06/01/2023   HCT 27.6 (L) 06/01/2023   MCV 90.8 06/01/2023   PLT 128 (L) 06/01/2023   Most recent BMP    Latest Ref Rng & Units 06/02/2023    4:35 AM  BMP  Glucose 70 - 99 mg/dL 147   BUN 8 - 23 mg/dL 44   Creatinine 8.29 - 1.24 mg/dL 5.62   Sodium 130 - 865 mmol/L 129   Potassium 3.5 - 5.1 mmol/L 3.9   Chloride 98 - 111 mmol/L 93   CO2 22 - 32 mmol/L 29   Calcium  8.9 - 10.3 mg/dL 7.6     Glendale Chard, DO 06/03/2023, 8:11 AM  PGY-1, [facility] Family Medicine FPTS Intern pager: [phone], text pages welcome Secure chat group [facility] Teaching Service

## 2023-06-03 NOTE — Assessment & Plan Note (Signed)
Stable.  - Management per nephrology

## 2023-06-03 NOTE — Progress Notes (Signed)
Received patient in bed to unit.  Alert and oriented.  Informed consent signed and in chart.   TX duration:4  Patient tolerated well.  Transported back to the room  Alert, without acute distress.  Hand-off given to patient's nurse.   Access used: left AVG Access issues: none  Total UF removed: 3L Medication(s) given: venofer   06/03/23 1244  Vitals  Temp 98 F (36.7 C)  Temp Source Oral  BP (!) 150/88  MAP (mmHg) 103  BP Location Right Leg  BP Method Automatic  Patient Position (if appropriate) Lying  Pulse Rate 88  Pulse Rate Source Monitor  ECG Heart Rate 89  Resp 11  Oxygen Therapy  SpO2 100 %  O2 Device Nasal Cannula  O2 Flow Rate (L/min) 3 L/min  During Treatment Monitoring  HD Safety Checks Performed Yes  Intra-Hemodialysis Comments Tx completed;Tolerated well  Dialysis Fluid Bolus Normal Saline  Bolus Amount (mL) 300 mL    Tabathia Knoche S [facility]

## 2023-06-03 NOTE — Assessment & Plan Note (Signed)
Stable. Elevated to 5L overnight. Will wean to 3 L home O2 today.  - Monitor oxygen saturation  - Wean [place] to baseline 3 L nasal cannula - Continue nasal saline rinse

## 2023-06-03 NOTE — Discharge Summary (Deleted)
Family Medicine Teaching [facility] Discharge Summary  Patient name: Jeffrey [name] Medical record number: [id number] Date of birth: 1961-11-12 Age: 62 y.o. Gender: male Date of Admission: 05/31/2023  Date of Discharge: 06/03/23  Admitting Physician: Darral Dash, DO  Primary Care Provider: Knox Royalty, MD Consultants: Nephrology   Indication for Hospitalization: Dyspnea 2/2 to Volume Overload   Brief Hospital Course:  Jeffrey [name] is a 62 y.o.male with a history of ESRD on HD (TTS), hypertension, hyperlipidemia, PAD s/p AKA who was admitted to the [facility] teaching [facility] at [facility] for dyspnea. His hospital course is detailed below:  Dyspnea/Hypervolemia in setting of ESRD with missed HD session In the ED, patient presented with hypervolemia and shortness of breath in setting of missed HD session.  Initial blood work with BUN 90, creatinine 5.20, glucose 407, hemoglobin 8.4.  He was hemodynamically stable.  Chest x-ray showed mild perihilar fullness, but no frank edema.  He initially required nonrebreather mask.  After his HD session, when he was placed on 4 L nasal cannula he maintained SpO2 98%. He completed an additional 2 sessions of dialysis and returned to his oxygen requirement baseline (3L). Reached out to renal navigator to inquire about his outpatient HD schedule. He was dismissed from his office, so we will continue to come to the ED to get HD Tuesday, Thursday, Saturday.  Type 2 diabetes mellitus Patient had elevated blood sugars in the hospital. A1c was 9.3 check on 06/01/23. Per patient request, we continued his home Humulin and instructed him to follow up with his PCP outpatient.   Anemia of Chronic Disease:  While inpatient his Hgb dropped to 8.4. Per nephrology, he received Iv iron and Aranesp with instructions to monitor Hgb.    Other chronic conditions were medically managed with home medications and formulary alternatives as necessary  (depression, BPH)  PCP Follow-up Recommendations: Patient reports forming his own regimen for insulin administration at home. It may be beneficial to review his insulin regimen and advise appropriate changes to have better glycemic control.  Recheck CBC outpatient.   Discharge Diagnoses/Problem List:  Principal Problem:   Shortness of breath Active Problems:   ESRD on dialysis (HCC)   Type 2 diabetes mellitus with hyperlipidemia (HCC)   Anemia of chronic disease   Disposition: home  Discharge Condition: stable, returned to baseline   Discharge Exam:  Chronically ill-appearing, no acute distress Cardio: Regular rate, regular rhythm, no murmurs on exam. Pulm: Clear, no wheezing, no crackles. No increased work of breathing Abdominal: bowel sounds present, soft, non-tender, non-distended Extremities: no peripheral edema  Neuro: alert and oriented x3, speech normal in content, no facial asymmetry, strength intact and equal bilaterally in UE and LE, pupils equal and reactive to light.  Psych:  Cognition and judgment appear intact. Alert, communicative  and cooperative with normal attention span and concentration. No apparent delusions, illusions, hallucinations    Significant Procedures: None   Significant Labs and Imaging:  No results for input(s): "WBC", "HGB", "HCT", "PLT" in the last 48 hours. 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   Aspirin Low Dose 81 MG tablet Generic drug: aspirin EC Take 81 mg by mouth in the morning.   atorvastatin 40 MG tablet Commonly known as: LIPITOR Take 40 mg by mouth in the morning.   calcium acetate 667 MG capsule Commonly known as: PHOSLO Take 2 capsules (1,334 mg total) by mouth with breakfast, with lunch, and with evening meal.   calcium carbonate (dosed in mg elemental calcium) 1250 MG/5ML Susp Take 5 mLs (500 mg of elemental calcium total) by mouth every 6 (six) hours as needed for indigestion.   carvedilol 12.5 MG tablet Commonly known as: COREG Take 12.5 mg by mouth 2 (two) times daily with a meal.   clopidogrel 75 MG tablet Commonly known as: PLAVIX Take 1 tablet (75 mg total) by mouth daily.   cyclobenzaprine 10 MG tablet Commonly known as: FLEXERIL Take 10 mg by mouth 3 (three) times daily as needed for muscle spasms.   diazepam 5 MG tablet Commonly known as: VALIUM Take 5 mg by mouth 2 (two) times daily as needed for muscle spasms.   HumuLIN R U-500 KwikPen 500 UNIT/ML KwikPen Generic drug: insulin regular human CONCENTRATED Inject 45 Units into the skin daily with supper. What changed:  how much to take when to take this   metolazone 10 MG tablet Commonly known as: ZAROXOLYN Take 10 mg by mouth daily.   nortriptyline 10 MG capsule Commonly known as: PAMELOR Take 20 mg by mouth at bedtime.   oxyCODONE 5 MG immediate release tablet Commonly known as: Roxicodone Take 1 tablet (5 mg total) by mouth every 8 (eight) hours as needed. What changed: reasons to take this   pantoprazole 40 MG tablet Commonly known as: PROTONIX Take 40 mg by mouth daily before breakfast.   pregabalin 75 MG capsule Commonly known as:  LYRICA Take 1 capsule (75 mg total) by mouth See admin instructions. Daily.  Give after dialysis on dialysis days.   sertraline 100 MG tablet Commonly known as: ZOLOFT Take 100 mg by mouth in the morning.   tamsulosin 0.4 MG Caps capsule Commonly known as: FLOMAX Take 1 capsule (0.4 mg total) by mouth daily.   torsemide 20 MG tablet Commonly known as: DEMADEX Take 120 mg by mouth 2 (two) times daily.   traZODone 150 MG tablet Commonly known as: DESYREL Take 150 mg by mouth at bedtime.   Vitamin D 50 MCG (2000 UT) Caps Take 2,000 Units by mouth daily.        Discharge Instructions: Please refer to Patient Instructions section of EMR for full details.  Patient was counseled important signs and symptoms that should prompt return to medical care, changes in medications, dietary instructions, activity restrictions, and follow up appointments.   Follow-Up Appointments:   Glendale Chard, DO 06/03/2023, 2:04 PM PGY-2, [facility]

## 2023-06-04 DIAGNOSIS — R0602 Shortness of breath: Secondary | ICD-10-CM | POA: Diagnosis not present

## 2023-06-04 LAB — GLUCOSE, CAPILLARY
Glucose-Capillary: 309 mg/dL — ABNORMAL HIGH (ref 70–99)
Glucose-Capillary: 333 mg/dL — ABNORMAL HIGH (ref 70–99)
Glucose-Capillary: 244 mg/dL — ABNORMAL HIGH (ref 70–99)

## 2023-06-04 NOTE — Progress Notes (Signed)
[facility] NEPHROLOGY PROGRESS NOTE  Assessment/ Plan: Pt is a 62 y.o. yo male ESRD on HD well-known to the practice from previous visits, discharged from outpatient HD who comes to ER for dialysis.  # Acute on chronic hypoxic respiratory failure/fluid overload: Received dialysis overnight with improvement of breathing.  Need more fluid removal.  Continue oxygen and bronchodilators per primary team.  # ESRD on HD: Missing dialysis often and has no outpatient HD center.  Underwent HD last night with 3.7 L UF.  He was fluid overload and rx HD overnight on 7/3 with 3.7L and then on 7/4 afternoon w/ 3L but had to give back. He cramps easily.  He has AV graft for the access.  Tolerated HD 7/6 with 3L net UF; usually TTS coming to ED as he's been d/c from HD unit for abusive behavior. If he's still here tomorrow will get him an extra treatment as he's still overloaded; he wants to only run 3 hours but he's a large man and should run 4hrs if he's agreeable to it. I counseled him to this issue today.  # Hemodialysis vascular access. Pt had new LUA AVG and LIJ TDC placed on 04/07/23 by Dr Edilia Bo. TDC was removed on 05/23/23. Using AVG now.   # Anemia: Treated with iron and Aranesp.  Monitor hemoglobin; will check w/ hd tomorrow.  # CKD-MBD: Monitor calcium and phosphorus level -> phos 4.6.  Continue PhosLo.  # Hypertension/volume: UF with HD as discussed above.  Continue antihypertensives.  Need fluid restriction.  Subjective: Tolerated HD on 7/6; wants to only run for 3 hrs. Denies cramping yesterday. No new complaints this am; states he can be off O2 [place] but he did sleep with it overnight.    Objective Vital signs in last 24 hours: Vitals:   06/03/23 1339 06/03/23 1650 06/03/23 1943 06/04/23 0504  BP: (!) 157/82 (!) 148/87 (!) 150/77 (!) 145/78  Pulse: 88 96 88 83  Resp:  18 14 16   Temp:  98.6 F (37 C) 99.4 F (37.4 C) 98.5 F (36.9 C)  TempSrc:      SpO2: 100% 100%  100% 100%  Weight:    135.2 kg  Height:       Weight change:   Intake/Output Summary (Last 24 hours) at 06/04/2023 0727 Last data filed at 06/03/2023 1359 Gross per 24 hour  Intake 360 ml  Output 3300 ml  Net -2940 ml       Labs: RENAL PANEL Recent Labs  Lab 05/31/23 2142 06/01/23 0030 06/01/23 1056 06/02/23 0435 06/03/23 0837  NA 135 136 133* 129* 134*  K 4.4 4.7 4.4 3.9 4.2  CL 97* 93* 94* 93* 90*  CO2  --  25 27 29 27   GLUCOSE 407* 444* 330* 445* 279*  BUN 90* 82* 48* 44* 61*  CREATININE 5.20* 4.93* 3.66* 3.57* 4.19*  CALCIUM  --  7.8* 7.9* 7.6* 8.2*  PHOS  --  5.1*  --  3.4 4.6  ALBUMIN  --  3.4* 3.7 3.3* 3.4*    Liver Function Tests: Recent Labs  Lab 06/01/23 1056 06/02/23 0435 06/03/23 0837  AST 15  --   --   ALT 14  --   --   ALKPHOS 94  --   --   BILITOT 0.4  --   --   PROT 7.6  --   --   ALBUMIN 3.7 3.3* 3.4*   No results for input(s): "LIPASE", "AMYLASE" in the last  168 hours. No results for input(s): "AMMONIA" in the last 168 hours. CBC: Recent Labs    12/02/22 1547 12/03/22 1814 01/24/23 1035 01/26/23 0736 04/11/23 1440 04/18/23 0756 05/18/23 0854 05/23/23 0940 05/23/23 0942 05/27/23 1057 05/31/23 2142 06/01/23 0030  HGB  --    < > 9.0*   < > 7.2*   < > 8.2* 8.8*  --  8.8* 10.9* 8.4*  MCV  --    < > 88.1   < > 91.0   < > 88.9 88.9  --  89.6  --  90.8  FERRITIN 1,056*  --  935*  --  545*  --   --   --  451*  --   --   --   TIBC 209*  --  255  --  230*  --   --   --  225*  --   --   --   IRON 64  --  55  --  26*  --   --   --  26*  --   --   --    < > = values in this interval not displayed.    Cardiac Enzymes: No results for input(s): "CKTOTAL", "CKMB", "CKMBINDEX", "TROPONINI" in the last 168 hours. CBG: Recent Labs  Lab 06/02/23 2123 06/03/23 0746 06/03/23 1340 06/03/23 1548 06/03/23 2041  GLUCAP 398* 263* 185* 301* 310*    Iron Studies: No results for input(s): "IRON", "TIBC", "TRANSFERRIN", "FERRITIN" in the last 72  hours. Studies/Results: No results found.  Medications: Infusions:  iron sucrose Stopped (06/03/23 1049)    Scheduled Medications:  amLODipine  10 mg Oral q morning   aspirin EC  81 mg Oral Daily   atorvastatin  40 mg Oral Daily   calcium acetate  1,334 mg Oral TID with meals   carvedilol  12.5 mg Oral BID WC   Chlorhexidine Gluconate Cloth  6 each Topical Daily   Chlorhexidine Gluconate Cloth  6 each Topical Q0600   cholecalciferol  2,000 Units Oral Daily   clopidogrel  75 mg Oral Daily   darbepoetin (ARANESP) injection - DIALYSIS  100 mcg Subcutaneous Q Thu-1800   heparin  5,000 Units Subcutaneous Q8H   insulin aspart  0-20 Units Subcutaneous TID WC   insulin glargine-yfgn  80 Units Subcutaneous Daily   metolazone  10 mg Oral Daily   nortriptyline  20 mg Oral QHS   pregabalin  75 mg Oral Q T,Th,Sa-HD   sertraline  100 mg Oral Daily   tamsulosin  0.4 mg Oral Daily   traZODone  150 mg Oral QHS    have reviewed scheduled and prn medications.  Physical Exam: General:NAD, comfortable Heart:RRR, s1s2 nl Lungs: Reduced breath sound bilateral, able to speak full sentence Abdomen:soft, Non-tender, non-distended Extremities: Bilateral total lower extremities has chronic pitting edema ++. Dialysis Access: AV graft has good thrill and bruit.

## 2023-06-04 NOTE — Plan of Care (Signed)
A/Ox4. No overnight issues. Ref heparin x2 doses. Overnight team notified. Hopeful discharge today.   Problem: Education: Goal: Knowledge of General Education information will improve Description: Including pain rating scale, medication(s)/side effects and non-pharmacologic comfort measures Outcome: Adequate for Discharge   Problem: Health Behavior/Discharge Planning: Goal: Ability to manage health-related needs will improve Outcome: Adequate for Discharge   Problem: Clinical Measurements: Goal: Ability to maintain clinical measurements within normal limits will improve Outcome: Adequate for Discharge Goal: Will remain free from infection Outcome: Adequate for Discharge Goal: Diagnostic test results will improve Outcome: Adequate for Discharge Goal: Respiratory complications will improve Outcome: Adequate for Discharge Goal: Cardiovascular complication will be avoided Outcome: Adequate for Discharge   Problem: Activity: Goal: Risk for activity intolerance will decrease Outcome: Adequate for Discharge   Problem: Nutrition: Goal: Adequate nutrition will be maintained Outcome: Adequate for Discharge   Problem: Coping: Goal: Level of anxiety will decrease Outcome: Adequate for Discharge   Problem: Elimination: Goal: Will not experience complications related to bowel motility Outcome: Adequate for Discharge Goal: Will not experience complications related to urinary retention Outcome: Adequate for Discharge   Problem: Pain Managment: Goal: General experience of comfort will improve Outcome: Adequate for Discharge   Problem: Safety: Goal: Ability to remain free from injury will improve Outcome: Adequate for Discharge   Problem: Skin Integrity: Goal: Risk for impaired skin integrity will decrease Outcome: Adequate for Discharge   Problem: Education: Goal: Ability to describe self-care measures that may prevent or decrease complications (Diabetes Survival Skills  Education) will improve Outcome: Adequate for Discharge Goal: Individualized Educational Video(s) Outcome: Adequate for Discharge   Problem: Coping: Goal: Ability to adjust to condition or change in health will improve Outcome: Adequate for Discharge   Problem: Fluid Volume: Goal: Ability to maintain a balanced intake and output will improve Outcome: Adequate for Discharge   Problem: Health Behavior/Discharge Planning: Goal: Ability to identify and utilize available resources and services will improve Outcome: Adequate for Discharge Goal: Ability to manage health-related needs will improve Outcome: Adequate for Discharge   Problem: Metabolic: Goal: Ability to maintain appropriate glucose levels will improve Outcome: Adequate for Discharge   Problem: Nutritional: Goal: Maintenance of adequate nutrition will improve Outcome: Adequate for Discharge Goal: Progress toward achieving an optimal weight will improve Outcome: Adequate for Discharge   Problem: Skin Integrity: Goal: Risk for impaired skin integrity will decrease Outcome: Adequate for Discharge   Problem: Tissue Perfusion: Goal: Adequacy of tissue perfusion will improve Outcome: Adequate for Discharge

## 2023-06-06 ENCOUNTER — Other Ambulatory Visit: Payer: Self-pay

## 2023-06-06 ENCOUNTER — Emergency Department (HOSPITAL_COMMUNITY)
Admission: EM | Admit: 2023-06-06 | Discharge: 2023-06-07 | Disposition: A | Discharge status: Home / Self Care | Payer: 59 | Attending: Emergency Medicine | Admitting: Emergency Medicine

## 2023-06-06 ENCOUNTER — Encounter (HOSPITAL_COMMUNITY): Payer: Self-pay | Admitting: Emergency Medicine

## 2023-06-06 DIAGNOSIS — Z992 Dependence on renal dialysis: Secondary | ICD-10-CM | POA: Diagnosis not present

## 2023-06-06 DIAGNOSIS — Z5329 Procedure and treatment not carried out because of patient's decision for other reasons: Secondary | ICD-10-CM | POA: Insufficient documentation

## 2023-06-06 DIAGNOSIS — R600 Localized edema: Secondary | ICD-10-CM | POA: Insufficient documentation

## 2023-06-06 DIAGNOSIS — N186 End stage renal disease: Secondary | ICD-10-CM | POA: Insufficient documentation

## 2023-06-06 DIAGNOSIS — Z7982 Long term (current) use of aspirin: Secondary | ICD-10-CM | POA: Insufficient documentation

## 2023-06-06 DIAGNOSIS — Z794 Long term (current) use of insulin: Secondary | ICD-10-CM | POA: Diagnosis not present

## 2023-06-06 DIAGNOSIS — R059 Cough, unspecified: Secondary | ICD-10-CM | POA: Insufficient documentation

## 2023-06-06 LAB — CBC WITH DIFFERENTIAL/PLATELET
Abs Immature Granulocytes: 0.1 10*3/uL — ABNORMAL HIGH (ref 0.00–0.07)
Basophils Absolute: 0 10*3/uL (ref 0.0–0.1)
Basophils Relative: 0 %
Eosinophils Absolute: 0.1 10*3/uL (ref 0.0–0.5)
Eosinophils Relative: 2 %
HCT: 28.3 % — ABNORMAL LOW (ref 39.0–52.0)
Hemoglobin: 8.8 g/dL — ABNORMAL LOW (ref 13.0–17.0)
Immature Granulocytes: 2 %
Lymphocytes Relative: 10 %
Lymphs Abs: 0.5 10*3/uL — ABNORMAL LOW (ref 0.7–4.0)
MCH: 27.4 pg (ref 26.0–34.0)
MCHC: 31.1 g/dL (ref 30.0–36.0)
MCV: 88.2 fL (ref 80.0–100.0)
Monocytes Absolute: 0.5 10*3/uL (ref 0.1–1.0)
Monocytes Relative: 8 %
Neutro Abs: 4.5 10*3/uL (ref 1.7–7.7)
Neutrophils Relative %: 78 %
Platelets: 114 10*3/uL — ABNORMAL LOW (ref 150–400)
RBC: 3.21 MIL/uL — ABNORMAL LOW (ref 4.22–5.81)
RDW: 14.9 % (ref 11.5–15.5)
WBC: 5.7 10*3/uL (ref 4.0–10.5)
nRBC: 0 % (ref 0.0–0.2)

## 2023-06-06 LAB — BASIC METABOLIC PANEL
Anion gap: 12 (ref 5–15)
BUN: 74 mg/dL — ABNORMAL HIGH (ref 8–23)
CO2: 27 mmol/L (ref 22–32)
Calcium: 8 mg/dL — ABNORMAL LOW (ref 8.9–10.3)
Chloride: 91 mmol/L — ABNORMAL LOW (ref 98–111)
Creatinine, Ser: 4.87 mg/dL — ABNORMAL HIGH (ref 0.61–1.24)
GFR, Estimated: 13 mL/min — ABNORMAL LOW (ref >60)
Glucose, Bld: 545 mg/dL (ref 70–99)
Potassium: 4 mmol/L (ref 3.5–5.1)
Sodium: 130 mmol/L — ABNORMAL LOW (ref 135–145)

## 2023-06-06 MED ORDER — SODIUM CHLORIDE 0.9 % IV SOLN
250.0000 mg | Freq: Once | INTRAVENOUS | Status: AC
  Administered 2023-06-06: 250 mg via INTRAVENOUS
  Filled 2023-06-06: qty 20

## 2023-06-06 MED ORDER — CHLORHEXIDINE GLUCONATE CLOTH 2 % EX PADS: 6.0000 | MEDICATED_PAD | Freq: Every day | CUTANEOUS | Status: DC

## 2023-06-06 MED ORDER — DARBEPOETIN ALFA 150 MCG/0.3ML IJ SOSY
150.0000 ug | PREFILLED_SYRINGE | Freq: Once | INTRAMUSCULAR | Status: AC
  Administered 2023-06-06: 150 ug via SUBCUTANEOUS
  Filled 2023-06-06: qty 0.3

## 2023-06-06 MED ORDER — HEPARIN SODIUM (PORCINE) 1000 UNIT/ML IJ SOLN
INTRAMUSCULAR | Status: AC
  Administered 2023-06-06: 1000 [IU]
  Filled 2023-06-06: qty 2

## 2023-06-06 NOTE — ED Triage Notes (Signed)
Patient arrives by POV in personal wheelchair for dialysis treatment.

## 2023-06-06 NOTE — Progress Notes (Signed)
   06/06/23 1539  Vitals  Temp 98.3 F (36.8 C)  BP (!) 151/75  BP Location Right Arm  BP Method Automatic  Patient Position (if appropriate) Lying  Pulse Rate 90  Resp 18  Oxygen Therapy  O2 Device Nasal Cannula  O2 Flow Rate (L/min) 3 L/min  Patient Activity (if Appropriate) In bed  Pulse Oximetry Type Continuous  Oximetry Probe Site Changed Yes  Post Treatment  Dialyzer Clearance Lightly streaked  Duration of HD Treatment -hour(s) 3.25 hour(s)  Hemodialysis Intake (mL) 0 mL  Liters Processed 78  Fluid Removed (mL) 3000 mL  Tolerated HD Treatment Yes  Post-Hemodialysis Comments pt goal met. (Ferric gluconate 250mg  IV. Aranesp 150 mcg given.)  AVG/AVF Arterial Site Held (minutes) 10 minutes  AVG/AVF Venous Site Held (minutes) 10 minutes  Fistula / Graft Left Upper arm Arteriovenous vein graft  Placement Date/Time: (c) 04/22/23 (c) 1000   Orientation: Left  Access Location: Upper arm  Access Type: Arteriovenous vein graft  Site Condition No complications  Fistula / Graft Assessment Present;Thrill;Bruit  Status Deaccessed  Needle Size 15  Drainage Description None

## 2023-06-06 NOTE — Procedures (Addendum)
We were asked to see this patient for dialysis. Pt was discharged from his OP HD unit in December 2023 due to behavioral issues. He no longer has a home outpatient HD unit. Pt came to ED today requesting hospital dialysis. Plan is for HD upstairs then will return to ED for reassessment and expected discharge home.      Last OP HD orders (dec 2023):  3:15h  129kg  RUE AVG   Heparin 2000     - last hep B labs done here on --> 06/01/23   - pt typically comes on Tu, Th and Sat for hospital HD   Anemia of esrd:  Pt had darbe in late April and in early May, but Hb continued to drop, so dosing of darbepoeitin was increased up to 100.   Tsat was low in May at 11% --> we gave him 1 gm load over 4 HD sessions. Repeat tsat on 6/25 was not much better at 12%, ferr 450. Will plan another 250mg  ferrlecit x 4 sessions starting today 06/06/23.    Hb has been trending up in mid June but seems stuck in the 8-9 range. Will increase to 150 mcg po weekly  or biweekly.    The darbepoeitin needs to ordered for this patient as "once in dialysis" and not "SQ at 1800" because he needs to be ready to leave at 5 pm to catch his ride home.    HD access: Pt had new LUA AVG and LIJ TDC placed on 04/07/23 by Dr Edilia Bo. TDC was removed on 05/23/23. Using AVG now.       Date             Hb         Tsat/ ferritin    Darbe SQ       ferric gluconate 12/02/22            11.0       22%/ 935 01/24/23           9.0        31%/ 1056   03/16/23            8.5                             60 mcg 03/30/23            8.0                             60 mcg  04/11/23            8.1        11%/ 545      100 mcg 5/18                                                                             250mg  #1 04/24/23             8.0                              60 mcg  04/27/23             7.3                                                      250mg  #2 04/29/23                                                100 mcg           250mg  #3       6/06                  7.5                               150 mcg 6/08                 7.9                                                        250mg  #4 05/11/23            8.1                               05/18/23            8.2                                150 mcg          250mg   6/25                                  12%/ 450 05/27/23                                                  100 mcg 05/31/23            8.4                                 150 mcg           250mg        Vinson Moselle  MD  [facility] 06/06/2023, 12:11 PM

## 2023-06-06 NOTE — ED Provider Notes (Signed)
[place] EMERGENCY DEPARTMENT AT [facility] Provider Note   CSN: [id number] Arrival date & time: 06/06/23  8657     History  Chief Complaint  Patient presents with   Here for Dialysis    Jeffrey [name] is a 62 y.o. male.  HPI Presents with request for dialysis. Patient has multiple medical problems, well-documented on multiple prior ED evaluations.  Essentially the patient requires ED evaluation for dialysis Tuesday, Thursday, Saturday.  Today he notes that he has a mild" summer cold" with cough, congestion, rhinorrhea, but no other complaints.    Home Medications Prior to Admission medications   Medication Sig Start Date End Date Taking? Authorizing Provider  amLODipine (NORVASC) 10 MG tablet Take 10 mg by mouth every morning. 04/17/20   [provider]  ASPIRIN LOW DOSE 81 MG EC tablet Take 81 mg by mouth in the morning. 02/22/21   [provider]  atorvastatin (LIPITOR) 40 MG tablet Take 40 mg by mouth in the morning. 02/09/21   [provider]  calcium acetate (PHOSLO) 667 MG capsule Take 2 capsules (1,334 mg total) by mouth with breakfast, with lunch, and with evening meal. 04/10/21   Calvert Cantor, MD  Calcium Carbonate Antacid (CALCIUM CARBONATE, DOSED IN MG ELEMENTAL CALCIUM,) 1250 MG/5ML SUSP Take 5 mLs (500 mg of elemental calcium total) by mouth every 6 (six) hours as needed for indigestion. 04/08/23   Rhetta Mura, MD  carvedilol (COREG) 12.5 MG tablet Take 12.5 mg by mouth 2 (two) times daily with a meal. 04/17/20   [provider]  Cholecalciferol (VITAMIN D) 50 MCG (2000 UT) CAPS Take 2,000 Units by mouth daily.    [provider]  clopidogrel (PLAVIX) 75 MG tablet Take 1 tablet (75 mg total) by mouth daily. 11/02/22   Rhyne, Ames Coupe, PA-C  cyclobenzaprine (FLEXERIL) 10 MG tablet Take 10 mg by mouth 3 (three) times daily as needed for muscle spasms. 12/29/21   [provider]  diazepam (VALIUM) 5 MG  tablet Take 5 mg by mouth 2 (two) times daily as needed for muscle spasms. 12/09/21   [provider]  insulin regular human CONCENTRATED (HUMULIN R U-500 KWIKPEN) 500 UNIT/ML KwikPen Inject 45 Units into the skin daily with supper. Patient taking differently: Inject 70-125 Units into the skin 2 (two) times daily with a meal. 09/04/21 08/30/24  [name], MD  metolazone (ZAROXOLYN) 10 MG tablet Take 10 mg by mouth daily. 07/13/21   [provider]  nortriptyline (PAMELOR) 10 MG capsule Take 20 mg by mouth at bedtime. 10/23/19 08/30/24  [provider]  oxyCODONE (ROXICODONE) 5 MG immediate release tablet Take 1 tablet (5 mg total) by mouth every 8 (eight) hours as needed. Patient taking differently: Take 5 mg by mouth every 8 (eight) hours as needed for moderate pain. 12/31/22 12/31/23  [name], [name], DO  pantoprazole (PROTONIX) 40 MG tablet Take 40 mg by mouth daily before breakfast. 02/09/21   [provider]  pregabalin (LYRICA) 75 MG capsule Take 1 capsule (75 mg total) by mouth See admin instructions. Daily.  Give after dialysis on dialysis days. 01/21/23   [name], MD  sertraline (ZOLOFT) 100 MG tablet Take 100 mg by mouth in the morning. 02/22/21   [provider]  tamsulosin (FLOMAX) 0.4 MG CAPS capsule Take 1 capsule (0.4 mg total) by mouth daily. 11/10/21   [name], MD  torsemide (DEMADEX) 20 MG tablet Take 120 mg by mouth 2 (two)  times daily.    [provider]  traZODone (DESYREL) 150 MG tablet Take 150 mg by mouth at bedtime. 12/09/21   [provider]      Allergies    Actos [pioglitazone], Dexmedetomidine, Ibuprofen, Tomato, and Wellbutrin [bupropion]    Review of Systems   Review of Systems  All other systems reviewed and are negative.   Physical Exam Updated Vital Signs BP (!) 155/61 (BP Location: Right Arm)   Pulse 91   Temp 98.9 F (37.2 C) (Oral)   Resp 18   Ht 5\' 5"  (1.651 m)   Wt 135.2 kg    SpO2 100%   BMI 49.59 kg/m  Physical Exam Vitals and nursing note reviewed.  Constitutional:      Appearance: He is well-developed.  HENT:     Head: Normocephalic and atraumatic.     Nose: Congestion and rhinorrhea present.  Eyes:     Conjunctiva/sclera: Conjunctivae normal.  Neck:     Vascular: No JVD.  Pulmonary:     Effort: No respiratory distress.     Breath sounds: No wheezing.     Comments: No increased work of breathing Abdominal:     General: There is no distension.  Musculoskeletal:     Comments: Edema to the bilateral lower extremities.  Bilateral amputations.  Skin:    Coloration: Skin is not pale.  Neurological:     Mental Status: He is alert and oriented to person, place, and time.  Psychiatric:        Behavior: Behavior normal.     ED Results / Procedures / Treatments   Labs (all labs ordered are listed, but only abnormal results are displayed) Labs Reviewed - No data to display  EKG None  Radiology No results found.  Procedures Procedures    Medications Ordered in ED Medications - No data to display  ED Course/ Medical Decision Making/ A&P                             Medical Decision Making Adult male with end-stage renal disease presents with rhinorrhea, congestion.  Patient is afebrile, awake, alert.  Patient has some evidence for fluid overload status consistent with need for dialysis, has no initial evidence for distress, is appropriate for her dialysis, likely discharge.  Amount and/or Complexity of Data Reviewed External Data Reviewed: notes.  Risk Diagnosis or treatment significantly limited by social determinants of health.  After my initial evaluation, assessment, I discussed the patient's case with our nephrology team.  With concern for need for dialysis he was transferred to their service.  Final Clinical Impression(s) / ED Diagnoses Final diagnoses:  Cough, unspecified type  ESRD needing dialysis [facility])     Gerhard Munch, MD 06/06/23 0930

## 2023-06-08 ENCOUNTER — Other Ambulatory Visit: Payer: Self-pay

## 2023-06-08 ENCOUNTER — Emergency Department (HOSPITAL_COMMUNITY)
Admission: EM | Admit: 2023-06-08 | Discharge: 2023-06-08 | Disposition: A | Discharge status: Home / Self Care | Payer: 59 | Attending: Emergency Medicine | Admitting: Emergency Medicine

## 2023-06-08 DIAGNOSIS — N186 End stage renal disease: Secondary | ICD-10-CM | POA: Insufficient documentation

## 2023-06-08 DIAGNOSIS — Z794 Long term (current) use of insulin: Secondary | ICD-10-CM | POA: Diagnosis not present

## 2023-06-08 DIAGNOSIS — Z7982 Long term (current) use of aspirin: Secondary | ICD-10-CM | POA: Insufficient documentation

## 2023-06-08 DIAGNOSIS — Z79899 Other long term (current) drug therapy: Secondary | ICD-10-CM | POA: Insufficient documentation

## 2023-06-08 DIAGNOSIS — E119 Type 2 diabetes mellitus without complications: Secondary | ICD-10-CM | POA: Insufficient documentation

## 2023-06-08 DIAGNOSIS — Z992 Dependence on renal dialysis: Secondary | ICD-10-CM | POA: Diagnosis present

## 2023-06-08 DIAGNOSIS — I509 Heart failure, unspecified: Secondary | ICD-10-CM | POA: Insufficient documentation

## 2023-06-08 LAB — RENAL FUNCTION PANEL
Albumin: 3.4 g/dL — ABNORMAL LOW (ref 3.5–5.0)
Anion gap: 18 — ABNORMAL HIGH (ref 5–15)
BUN: 68 mg/dL — ABNORMAL HIGH (ref 8–23)
CO2: 26 mmol/L (ref 22–32)
Calcium: 8.1 mg/dL — ABNORMAL LOW (ref 8.9–10.3)
Chloride: 89 mmol/L — ABNORMAL LOW (ref 98–111)
Creatinine, Ser: 4.63 mg/dL — ABNORMAL HIGH (ref 0.61–1.24)
GFR, Estimated: 14 mL/min — ABNORMAL LOW (ref >60)
Glucose, Bld: 312 mg/dL — ABNORMAL HIGH (ref 70–99)
Phosphorus: 3.4 mg/dL (ref 2.5–4.6)
Potassium: 3.6 mmol/L (ref 3.5–5.1)
Sodium: 133 mmol/L — ABNORMAL LOW (ref 135–145)

## 2023-06-08 LAB — CBC WITH DIFFERENTIAL/PLATELET
Abs Immature Granulocytes: 0.22 10*3/uL — ABNORMAL HIGH (ref 0.00–0.07)
Basophils Absolute: 0 10*3/uL (ref 0.0–0.1)
Basophils Relative: 0 %
Eosinophils Absolute: 0.1 10*3/uL (ref 0.0–0.5)
Eosinophils Relative: 2 %
HCT: 30.3 % — ABNORMAL LOW (ref 39.0–52.0)
Hemoglobin: 9.3 g/dL — ABNORMAL LOW (ref 13.0–17.0)
Immature Granulocytes: 3 %
Lymphocytes Relative: 9 %
Lymphs Abs: 0.7 10*3/uL (ref 0.7–4.0)
MCH: 26.6 pg (ref 26.0–34.0)
MCHC: 30.7 g/dL (ref 30.0–36.0)
MCV: 86.6 fL (ref 80.0–100.0)
Monocytes Absolute: 0.6 10*3/uL (ref 0.1–1.0)
Monocytes Relative: 8 %
Neutro Abs: 6.3 10*3/uL (ref 1.7–7.7)
Neutrophils Relative %: 78 %
Platelets: 141 10*3/uL — ABNORMAL LOW (ref 150–400)
RBC: 3.5 MIL/uL — ABNORMAL LOW (ref 4.22–5.81)
RDW: 15.2 % (ref 11.5–15.5)
WBC: 8 10*3/uL (ref 4.0–10.5)
nRBC: 0 % (ref 0.0–0.2)

## 2023-06-08 MED ORDER — LIDOCAINE-PRILOCAINE 2.5-2.5 % EX CREA: 1.0000 | TOPICAL_CREAM | CUTANEOUS | Status: DC | PRN

## 2023-06-08 MED ORDER — PENTAFLUOROPROP-TETRAFLUOROETH EX AERO: 1.0000 | INHALATION_SPRAY | CUTANEOUS | Status: DC | PRN

## 2023-06-08 MED ORDER — HEPARIN SODIUM (PORCINE) 1000 UNIT/ML DIALYSIS: 1000.0000 [IU] | INTRAMUSCULAR | Status: DC | PRN

## 2023-06-08 MED ORDER — HEPARIN SODIUM (PORCINE) 1000 UNIT/ML DIALYSIS: 2800.0000 [IU] | INTRAMUSCULAR | Status: DC | PRN

## 2023-06-08 MED ORDER — LIDOCAINE HCL (PF) 1 % IJ SOLN: 5.0000 mL | INTRAMUSCULAR | Status: DC | PRN

## 2023-06-08 MED ORDER — HEPARIN SODIUM (PORCINE) 1000 UNIT/ML DIALYSIS
2000.0000 [IU] | Freq: Once | INTRAMUSCULAR | Status: DC
  Filled 2023-06-08: qty 2

## 2023-06-08 MED ORDER — HEPARIN SODIUM (PORCINE) 1000 UNIT/ML DIALYSIS
3000.0000 [IU] | INTRAMUSCULAR | Status: DC | PRN
  Administered 2023-06-08: 3000 [IU] via INTRAVENOUS_CENTRAL
  Filled 2023-06-08 (×2): qty 3

## 2023-06-08 NOTE — ED Notes (Signed)
Pt requested bloodwork to be done at dialysis.

## 2023-06-08 NOTE — Progress Notes (Signed)
Received patient in E.D bed.Awake,alert and oriented x 4.He signed his consent treatment.  Access used : Left upper arm AVG that worked well.  Duration of treatment: 1.5 out of 3 hours prescribed.  Fluid removed : 1.4 liters   Hemo comment: He signed  an AMA due to leg cramping.  Hand off to the patient's nurse:

## 2023-06-08 NOTE — ED Provider Notes (Signed)
Jeffrey [name] Provider Note   CSN: [id number] Arrival date & time: 06/08/23  0720     History Chief Complaint  Patient presents with   Vascular Access Problem    Jeffrey [name] is a 62 y.o. male with ESRD on T/Th/Sat Dialysis, CHF, type 2 diabetes, presents to the ER for evaluation and need of dialysis.  Patient has been present to the ER multiple times for this and is a known patient.  He currently denies any new/worsening shortness of breath or any other symptoms.  HPI     Home Medications Prior to Admission medications   Medication Sig Start Date End Date Taking? Authorizing Provider  amLODipine (NORVASC) 10 MG tablet Take 10 mg by mouth every morning. 04/17/20   [provider]  ASPIRIN LOW DOSE 81 MG EC tablet Take 81 mg by mouth in the morning. 02/22/21   [provider]  atorvastatin (LIPITOR) 40 MG tablet Take 40 mg by mouth in the morning. 02/09/21   [provider]  calcium acetate (PHOSLO) 667 MG capsule Take 2 capsules (1,334 mg total) by mouth with breakfast, with lunch, and with evening meal. 04/10/21   Calvert Cantor, MD  Calcium Carbonate Antacid (CALCIUM CARBONATE, DOSED IN MG ELEMENTAL CALCIUM,) 1250 MG/5ML SUSP Take 5 mLs (500 mg of elemental calcium total) by mouth every 6 (six) hours as needed for indigestion. 04/08/23   Rhetta Mura, MD  carvedilol (COREG) 12.5 MG tablet Take 12.5 mg by mouth 2 (two) times daily with a meal. 04/17/20   [provider]  Cholecalciferol (VITAMIN D) 50 MCG (2000 UT) CAPS Take 2,000 Units by mouth daily.    [provider]  clopidogrel (PLAVIX) 75 MG tablet Take 1 tablet (75 mg total) by mouth daily. 11/02/22   Rhyne, Ames Coupe, PA-C  cyclobenzaprine (FLEXERIL) 10 MG tablet Take 10 mg by mouth 3 (three) times daily as needed for muscle spasms. 12/29/21   [provider]  diazepam (VALIUM) 5 MG tablet Take 5 mg by mouth 2 (two) times daily  as needed for muscle spasms. 12/09/21   [provider]  insulin regular human CONCENTRATED (HUMULIN R U-500 KWIKPEN) 500 UNIT/ML KwikPen Inject 45 Units into the skin daily with supper. Patient taking differently: Inject 70-125 Units into the skin 2 (two) times daily with a meal. 09/04/21 08/30/24  [name], MD  metolazone (ZAROXOLYN) 10 MG tablet Take 10 mg by mouth daily. 07/13/21   [provider]  nortriptyline (PAMELOR) 10 MG capsule Take 20 mg by mouth at bedtime. 10/23/19 08/30/24  [provider]  oxyCODONE (ROXICODONE) 5 MG immediate release tablet Take 1 tablet (5 mg total) by mouth every 8 (eight) hours as needed. Patient taking differently: Take 5 mg by mouth every 8 (eight) hours as needed for moderate pain. 12/31/22 12/31/23  [name], [name], DO  pantoprazole (PROTONIX) 40 MG tablet Take 40 mg by mouth daily before breakfast. 02/09/21   [provider]  pregabalin (LYRICA) 75 MG capsule Take 1 capsule (75 mg total) by mouth See admin instructions. Daily.  Give after dialysis on dialysis days. 01/21/23   [name], MD  sertraline (ZOLOFT) 100 MG tablet Take 100 mg by mouth in the morning. 02/22/21   [provider]  tamsulosin (FLOMAX) 0.4 MG CAPS capsule Take 1 capsule (0.4 mg total) by mouth daily. 11/10/21   [name], MD  torsemide (DEMADEX) 20 MG tablet Take 120 mg by mouth  2 (two) times daily.    [provider]  traZODone (DESYREL) 150 MG tablet Take 150 mg by mouth at bedtime. 12/09/21   [provider]      Allergies    Actos [pioglitazone], Dexmedetomidine, Ibuprofen, Tomato, and Wellbutrin [bupropion]    Review of Systems   Review of Systems  Constitutional:  Negative for fever.  Respiratory:  Negative for shortness of breath.   Cardiovascular:  Negative for chest pain.    Physical Exam Updated Vital Signs There were no vitals taken for this visit. Physical Exam Constitutional:      General: He is  not in acute distress.    Appearance: Normal appearance. He is not ill-appearing or toxic-appearing.  Eyes:     General: No scleral icterus. Pulmonary:     Effort: Pulmonary effort is normal. No respiratory distress.  Skin:    General: Skin is dry.  Neurological:     Mental Status: He is alert. Mental status is at baseline.  Psychiatric:        Mood and Affect: Mood normal.     ED Results / Procedures / Treatments   Labs (all labs ordered are listed, but only abnormal results are displayed) Labs Reviewed  CBC WITH DIFFERENTIAL/PLATELET  RENAL FUNCTION PANEL  HEPATITIS B SURFACE ANTIGEN    EKG None  Radiology No results found.  Procedures Procedures   Medications Ordered in ED Medications - No data to display  ED Course/ Medical Decision Making/ A&P   Medical Decision Making Amount and/or Complexity of Data Reviewed Labs: ordered.   63 y.o. male presents to the ER today for evaluation of need dialysis. Differential diagnosis includes but is not limited to dialysis, volume overload, electrolyte abnormalities. Vital signs show mildly elevated BP otherwise unremarkable. Physical exam as noted above.  He is on his chronic 3 L nasal cannula.  No other complaints.  Vital signs show blood hypertension otherwise patient does not have a fever.  He is awake and alert.  Likely can be discharged after dialysis.  Patient has refused blood draw in the ER and is requesting for it to be done at dialysis.   Nephrology paged.   Spoke with Dr. Arlean Hopping who will see the patient for dialysis. He is aware that the patient is requesting labs to be drawn in dialysis. He will be transferred to their service.   Portions of this report may have been transcribed using voice recognition software. Every effort was made to ensure accuracy; however, inadvertent computerized transcription errors may be present.   Final Clinical Impression(s) / ED Diagnoses Final diagnoses:  None    Rx / DC  Orders ED Discharge Orders     None         [name], [name] 06/08/23 0908    Terrilee Files, MD 06/08/23 1725

## 2023-06-08 NOTE — ED Notes (Addendum)
Pt declines discharge paperwork, states his ride is on the way and he is ready to go; pt A and O x 4, appears stable, has no complaints; pt discharged to ED lobby

## 2023-06-08 NOTE — ED Triage Notes (Signed)
Pt here for dialysis . Last treatment was Tuesday, pt stated, all weight was off. Denies any problems.

## 2023-06-10 ENCOUNTER — Emergency Department (HOSPITAL_COMMUNITY)
Admission: EM | Admit: 2023-06-10 | Discharge: 2023-06-10 | Disposition: A | Discharge status: Home / Self Care | Payer: 59 | Attending: Emergency Medicine | Admitting: Emergency Medicine

## 2023-06-10 ENCOUNTER — Encounter (HOSPITAL_COMMUNITY): Payer: Self-pay | Admitting: *Deleted

## 2023-06-10 ENCOUNTER — Other Ambulatory Visit: Payer: Self-pay

## 2023-06-10 DIAGNOSIS — Z794 Long term (current) use of insulin: Secondary | ICD-10-CM | POA: Diagnosis not present

## 2023-06-10 DIAGNOSIS — I120 Hypertensive chronic kidney disease with stage 5 chronic kidney disease or end stage renal disease: Secondary | ICD-10-CM | POA: Diagnosis not present

## 2023-06-10 DIAGNOSIS — R0602 Shortness of breath: Secondary | ICD-10-CM | POA: Insufficient documentation

## 2023-06-10 DIAGNOSIS — N186 End stage renal disease: Secondary | ICD-10-CM | POA: Insufficient documentation

## 2023-06-10 DIAGNOSIS — Z992 Dependence on renal dialysis: Secondary | ICD-10-CM | POA: Diagnosis not present

## 2023-06-10 DIAGNOSIS — Z79899 Other long term (current) drug therapy: Secondary | ICD-10-CM | POA: Insufficient documentation

## 2023-06-10 DIAGNOSIS — R600 Localized edema: Secondary | ICD-10-CM | POA: Insufficient documentation

## 2023-06-10 DIAGNOSIS — Z7982 Long term (current) use of aspirin: Secondary | ICD-10-CM | POA: Diagnosis not present

## 2023-06-10 MED ORDER — CHLORHEXIDINE GLUCONATE CLOTH 2 % EX PADS: 6.0000 | MEDICATED_PAD | Freq: Every day | CUTANEOUS | Status: DC

## 2023-06-10 MED ORDER — SODIUM CHLORIDE 0.9 % IV SOLN
250.0000 mg | Freq: Once | INTRAVENOUS | Status: DC
  Filled 2023-06-10: qty 20

## 2023-06-10 NOTE — ED Notes (Signed)
Pt left AMA at this time. Missed his dialysis for today. Dr.Lockwood notified by this RN.

## 2023-06-10 NOTE — ED Notes (Signed)
RN called dialysis at this time and spoke to dialysis charge nurse who states that pt has no HD orders at this time. Spoke to Dr.Lockwood and states he has paged nephro an hour ago. Unit secretary repaged nephro at this time. Waiting for nephro orders. Pt updated at this time.

## 2023-06-10 NOTE — ED Triage Notes (Signed)
Patient without complaints here for dialysis only

## 2023-06-10 NOTE — ED Notes (Signed)
RN called dialysis charge nurse who states there are a few pts ahead of pt. Pt states he might just have to leave as he is going to miss his ride who only transports until 1700. Pt has a special electric wheelchair that could only be transported by this ride. Dr.Schertz notified of pt frustration as he has been here since this AM. Will continue to monitor.

## 2023-06-10 NOTE — ED Notes (Signed)
Hemodialysis aware patient is here.

## 2023-06-10 NOTE — ED Provider Notes (Signed)
[place] EMERGENCY DEPARTMENT AT [facility] Provider Note   CSN: [id number] Arrival date & time: 06/10/23  0750     History  Chief Complaint  Patient presents with   Vascular Access Problem    CORDON LARI is a 62 y.o. male.  HPI Presents with request for dialysis.  Patient has end-stage renal disease received dialysis in the emergency department.  He notes that since his last session 2 days ago he has been doing generally well, has developed no new complaints beyond his baseline symptoms that he associates with need for dialysis including fluid overload status. No new fever. Patient had a cold last time I saw him, last week, he notes that this has improved.      Home Medications Prior to Admission medications   Medication Sig Start Date End Date Taking? Authorizing Provider  amLODipine (NORVASC) 10 MG tablet Take 10 mg by mouth every morning. 04/17/20   [provider]  ASPIRIN LOW DOSE 81 MG EC tablet Take 81 mg by mouth in the morning. 02/22/21   [provider]  atorvastatin (LIPITOR) 40 MG tablet Take 40 mg by mouth in the morning. 02/09/21   [provider]  calcium acetate (PHOSLO) 667 MG capsule Take 2 capsules (1,334 mg total) by mouth with breakfast, with lunch, and with evening meal. 04/10/21   Calvert Cantor, MD  Calcium Carbonate Antacid (CALCIUM CARBONATE, DOSED IN MG ELEMENTAL CALCIUM,) 1250 MG/5ML SUSP Take 5 mLs (500 mg of elemental calcium total) by mouth every 6 (six) hours as needed for indigestion. 04/08/23   Rhetta Mura, MD  carvedilol (COREG) 12.5 MG tablet Take 12.5 mg by mouth 2 (two) times daily with a meal. 04/17/20   [provider]  Cholecalciferol (VITAMIN D) 50 MCG (2000 UT) CAPS Take 2,000 Units by mouth daily.    [provider]  clopidogrel (PLAVIX) 75 MG tablet Take 1 tablet (75 mg total) by mouth daily. 11/02/22   Rhyne, Ames Coupe, PA-C  cyclobenzaprine (FLEXERIL) 10 MG tablet Take 10  mg by mouth 3 (three) times daily as needed for muscle spasms. 12/29/21   [provider]  diazepam (VALIUM) 5 MG tablet Take 5 mg by mouth 2 (two) times daily as needed for muscle spasms. 12/09/21   [provider]  insulin regular human CONCENTRATED (HUMULIN R U-500 KWIKPEN) 500 UNIT/ML KwikPen Inject 45 Units into the skin daily with supper. Patient taking differently: Inject 70-125 Units into the skin 2 (two) times daily with a meal. 09/04/21 08/30/24  [name], MD  metolazone (ZAROXOLYN) 10 MG tablet Take 10 mg by mouth daily. 07/13/21   [provider]  nortriptyline (PAMELOR) 10 MG capsule Take 20 mg by mouth at bedtime. 10/23/19 08/30/24  [provider]  oxyCODONE (ROXICODONE) 5 MG immediate release tablet Take 1 tablet (5 mg total) by mouth every 8 (eight) hours as needed. Patient taking differently: Take 5 mg by mouth every 8 (eight) hours as needed for moderate pain. 12/31/22 12/31/23  [name], [name], DO  pantoprazole (PROTONIX) 40 MG tablet Take 40 mg by mouth daily before breakfast. 02/09/21   [provider]  pregabalin (LYRICA) 75 MG capsule Take 1 capsule (75 mg total) by mouth See admin instructions. Daily.  Give after dialysis on dialysis days. 01/21/23   [name], MD  sertraline (ZOLOFT) 100 MG tablet Take 100 mg by mouth in the morning. 02/22/21   [provider]  tamsulosin (FLOMAX) 0.4 MG CAPS  capsule Take 1 capsule (0.4 mg total) by mouth daily. 11/10/21   [name], MD  torsemide (DEMADEX) 20 MG tablet Take 120 mg by mouth 2 (two) times daily.    [provider]  traZODone (DESYREL) 150 MG tablet Take 150 mg by mouth at bedtime. 12/09/21   [provider]      Allergies    Actos [pioglitazone], Dexmedetomidine, Ibuprofen, Tomato, and Wellbutrin [bupropion]    Review of Systems   Review of Systems  All other systems reviewed and are negative.   Physical Exam Updated Vital Signs BP (!) 167/63  (BP Location: Right Wrist)   Pulse 86   Temp 98.9 F (37.2 C) (Oral)   Resp 18   Ht 5\' 5"  (1.651 m)   Wt 135.2 kg   SpO2 100%   BMI 49.60 kg/m  Physical Exam Vitals and nursing note reviewed.  Constitutional:      Appearance: He is well-developed.  HENT:     Head: Normocephalic and atraumatic.  Eyes:     Conjunctiva/sclera: Conjunctivae normal.  Neck:     Vascular: No JVD.  Pulmonary:     Effort: No respiratory distress.     Breath sounds: No wheezing.     Comments: No increased work of breathing Abdominal:     General: There is no distension.  Musculoskeletal:     Comments: Edema to the bilateral lower extremities.  Bilateral amputations.  Skin:    Coloration: Skin is not pale.  Neurological:     Mental Status: He is alert and oriented to person, place, and time.  Psychiatric:        Behavior: Behavior normal.     ED Results / Procedures / Treatments   Labs (all labs ordered are listed, but only abnormal results are displayed) Labs Reviewed - No data to display  EKG None  Radiology No results found.  Procedures Procedures    Medications Ordered in ED Medications - No data to display  ED Course/ Medical Decision Making/ A&P                             Medical Decision Making Adult male with multiple medical problems including hypertension, end-stage renal disease presents with need for dialysis given mild dyspnea. No clinical evidence for pneumonia, bacteremia, sepsis, patient appropriate for dialysis, facilitation provided.  Amount and/or Complexity of Data Reviewed External Data Reviewed: notes.  Risk Decision regarding hospitalization. Diagnosis or treatment significantly limited by social determinants of health.  Final Clinical Impression(s) / ED Diagnoses Final diagnoses:  SOB (shortness of breath)  ESRD on dialysis [facility])    Rx / DC Orders ED Discharge Orders     None         Gerhard Munch, MD 06/10/23 1030

## 2023-06-10 NOTE — ED Notes (Addendum)
Dr.Schertz messaged by this RN at 1050AM to place HD orders for pt. MD states he will put orders in. Waiting for orders at this time so pt can go to dialysis.

## 2023-06-12 ENCOUNTER — Ambulatory Visit (INDEPENDENT_AMBULATORY_CARE_PROVIDER_SITE_OTHER): Payer: 59 | Admitting: Physician Assistant

## 2023-06-12 VITALS — BP 149/49 | HR 85 | Temp 97.6°F

## 2023-06-12 DIAGNOSIS — Z992 Dependence on renal dialysis: Secondary | ICD-10-CM | POA: Diagnosis not present

## 2023-06-12 DIAGNOSIS — N186 End stage renal disease: Secondary | ICD-10-CM

## 2023-06-12 NOTE — Progress Notes (Signed)
Office Note     CC:  follow up Requesting Provider:  Knox Royalty, MD  HPI: Jeffrey [name] is a 62 y.o. (07/27/1961) male who presents for incision check of left neck.  He is status post left arm AV graft placement by Dr. Edilia Bo on 04/07/2023.  Left IJ TDC was no longer required after 1 month.  TDC was removed in the emergency department on 05/23/2023 by myself.  Due to extensive scar tissue ,this required cutdown on the cuff.  He states this incision is healed.  He is here for suture removal.  He states his left arm AV graft has been working well during dialysis.  He dialyzes at [facility] 3 days a week.   Past Medical History:  Diagnosis Date   Altered mental status 05/04/2021   Arthritis    Blood transfusion without reported diagnosis    CHF (congestive heart failure) (HCC)    COPD (chronic obstructive pulmonary disease) (HCC)    Depression    Diabetes mellitus without complication (HCC)    type 2   Dyspnea    ESRD on hemodialysis (HCC)    Tues Thurs Sat   GERD (gastroesophageal reflux disease)    protonix   Heart murmur    as a child, no problems   Hypertension    Myocardial infarction (HCC)    PTSD (post-traumatic stress disorder)    Sepsis (HCC)    Sleep apnea    occasional uses CPAP   Uses powered wheelchair     Past Surgical History:  Procedure Laterality Date   A/V FISTULAGRAM N/A 08/30/2021   Procedure: A/V FISTULAGRAM;  Surgeon: Maeola Harman, MD;  Location: [facility] INVASIVE CV LAB;  Service: Cardiovascular;  Laterality: N/A;   A/V FISTULAGRAM Right 07/28/2022   Procedure: A/V Fistulagram;  Surgeon: Maeola Harman, MD;  Location: [facility] INVASIVE CV LAB;  Service: Cardiovascular;  Laterality: Right;   A/V FISTULAGRAM Right 09/05/2022   Procedure: A/V Fistulagram;  Surgeon: Maeola Harman, MD;  Location: [facility] INVASIVE CV LAB;  Service: Cardiovascular;  Laterality: Right;   A/V FISTULAGRAM Right 10/31/2022   Procedure: A/V Fistulagram;  Surgeon:  Maeola Harman, MD;  Location: [facility] INVASIVE CV LAB;  Service: Cardiovascular;  Laterality: Right;   A/V SHUNT INTERVENTION Right 10/31/2022   Procedure: A/V SHUNT INTERVENTION;  Surgeon: Maeola Harman, MD;  Location: [facility] INVASIVE CV LAB;  Service: Cardiovascular;  Laterality: Right;   A/V SHUNTOGRAM N/A 11/08/2021   Procedure: A/V SHUNTOGRAM;  Surgeon: Maeola Harman, MD;  Location: [facility] INVASIVE CV LAB;  Service: Cardiovascular;  Laterality: N/A;   AMPUTATION Left 04/03/2021   Procedure: LEFT ABOVE-THE-KNEE AMPUTATION;  Surgeon: Terance Hart, MD;  Location: [facility] OR;  Service: Orthopedics;  Laterality: Left;   AV FISTULA PLACEMENT Right 08/12/2021   Procedure: INSERTION OF ARTERIOVENOUS (AV) GORE-TEX GRAFT RIGHT ARM;  Surgeon: Nada Libman, MD;  Location: [facility] OR;  Service: Vascular;  Laterality: Right;   AV FISTULA PLACEMENT Left 04/07/2023   Procedure: ARTERIOVENOUS (AV) FISTULA GRAFT USING GORETEX STRETCH (4-7MM);  Surgeon: Chuck Hint, MD;  Location: [facility] OR;  Service: Vascular;  Laterality: Left;   INSERTION OF DIALYSIS CATHETER N/A 11/25/2022   Procedure: INSERTION OF DIALYSIS CATHETER;  Surgeon: Lorin Glass, MD;  Location: [facility] ENDOSCOPY;  Service: Pulmonary;  Laterality: N/A;   INSERTION OF DIALYSIS CATHETER N/A 03/03/2023   Procedure: INSERTION OF TUNNELED DIALYSIS CATHETER;  Surgeon: Maeola Harman, MD;  Location: [facility] OR;  Service: Vascular;  Laterality: N/A;   INSERTION OF DIALYSIS CATHETER Left 04/07/2023   Procedure: INSERTION OF DIALYSIS CATHETER USING PALINDROME  28CM PRECISION CHRONIC CATHETER KIT;  Surgeon: Chuck Hint, MD;  Location: [facility] OR;  Service: Vascular;  Laterality: Left;   IR REMOVAL TUN CV CATH W/O FL  03/30/2021   JOINT REPLACEMENT     Bilateral knees   LIGATION ARTERIOVENOUS GORTEX GRAFT Right 03/03/2023   Procedure: LIGATION RIGHT ARM ARTERIOVENOUS GORTEX GRAFT;  Surgeon: Maeola Harman, MD;   Location: [facility] OR;  Service: Vascular;  Laterality: Right;   PERIPHERAL VASCULAR BALLOON ANGIOPLASTY Right 08/30/2021   Procedure: PERIPHERAL VASCULAR BALLOON ANGIOPLASTY;  Surgeon: Maeola Harman, MD;  Location: [facility] INVASIVE CV LAB;  Service: Cardiovascular;  Laterality: Right;   PERIPHERAL VASCULAR BALLOON ANGIOPLASTY Right 07/28/2022   Procedure: PERIPHERAL VASCULAR BALLOON ANGIOPLASTY;  Surgeon: Maeola Harman, MD;  Location: [facility] INVASIVE CV LAB;  Service: Cardiovascular;  Laterality: Right;  AVF   PERIPHERAL VASCULAR INTERVENTION  11/08/2021   Procedure: PERIPHERAL VASCULAR INTERVENTION;  Surgeon: Maeola Harman, MD;  Location: [facility] INVASIVE CV LAB;  Service: Cardiovascular;;  Central rt arm fistula   UPPER EXTREMITY VENOGRAPHY Right 08/10/2021   Procedure: UPPER EXTREMITY VENOGRAPHY;  Surgeon: Nada Libman, MD;  Location: [facility] INVASIVE CV LAB;  Service: Cardiovascular;  Laterality: Right;    Social History   Socioeconomic History   Marital status: Significant Other    Spouse name: Not on file   Number of children: Not on file   Years of education: Not on file   Highest education level: Not on file  Occupational History   Occupation: disabled  Tobacco Use   Smoking status: Former    Current packs/day: 0.00    Types: Cigarettes    Quit date: 2012    Years since quitting: 12.5   Smokeless tobacco: Never  Vaping Use   Vaping status: Never Used  Substance and Sexual Activity   Alcohol use: Yes    Comment: occasionally   Drug use: Not Currently    Comment: Years ago   Sexual activity: Not on file  Other Topics Concern   Not on file  Social History Narrative   Not on file   Social Determinants of Health   Financial Resource Strain: Medium Risk (09/05/2022)   Received from [facility], [facility]   Overall Financial Resource Strain (CARDIA)    Difficulty of Paying Living Expenses: Somewhat hard  Food  Insecurity: No Food Insecurity (06/01/2023)   Hunger Vital Sign    Worried About Running Out of Food in the Last Year: Never true    Ran Out of Food in the Last Year: Never true  Transportation Needs: No Transportation Needs (06/01/2023)   PRAPARE - Administrator, Civil Service (Medical): No    Lack of Transportation (Non-Medical): No  Physical Activity: Inactive (09/08/2021)   Received from [facility], [facility]   Exercise Vital Sign    Days of Exercise per Week: 0 days    Minutes of Exercise per Session: 0 min  Stress: Stress Concern Present (09/07/2022)   Received from [facility], [facility]   [employer] of Occupational Health - Occupational Stress Questionnaire    Feeling of Stress : Rather much  Social Connections: Moderately Isolated (09/07/2022)   Received from [facility], [facility]   Social Connection and Isolation Panel [facility]  Frequency of Communication with Friends and Family: More than three times a week    Frequency of Social Gatherings with Friends and Family: Once a week    Attends Religious Services: 1 to 4 times per year    Active Member of [employer] or Organizations: No    Attends Banker Meetings: Never    Marital Status: Widowed  Intimate Partner Violence: Not At Risk (06/01/2023)   Humiliation, Afraid, Rape, and Kick questionnaire    Fear of Current or Ex-Partner: No    Emotionally Abused: No    Physically Abused: No    Sexually Abused: No   History reviewed. No pertinent family history.  Current Outpatient Medications  Medication Sig Dispense Refill   amLODipine (NORVASC) 10 MG tablet Take 10 mg by mouth every morning.     ASPIRIN LOW DOSE 81 MG EC tablet Take 81 mg by mouth in the morning.     atorvastatin (LIPITOR) 40 MG tablet Take 40 mg by mouth in the morning.     calcium acetate (PHOSLO) 667 MG capsule Take 2  capsules (1,334 mg total) by mouth with breakfast, with lunch, and with evening meal.     Calcium Carbonate Antacid (CALCIUM CARBONATE, DOSED IN MG ELEMENTAL CALCIUM,) 1250 MG/5ML SUSP Take 5 mLs (500 mg of elemental calcium total) by mouth every 6 (six) hours as needed for indigestion. 450 mL 2   carvedilol (COREG) 12.5 MG tablet Take 12.5 mg by mouth 2 (two) times daily with a meal.     Cholecalciferol (VITAMIN D) 50 MCG (2000 UT) CAPS Take 2,000 Units by mouth daily.     clopidogrel (PLAVIX) 75 MG tablet Take 1 tablet (75 mg total) by mouth daily. 30 tablet 11   cyclobenzaprine (FLEXERIL) 10 MG tablet Take 10 mg by mouth 3 (three) times daily as needed for muscle spasms.     diazepam (VALIUM) 5 MG tablet Take 5 mg by mouth 2 (two) times daily as needed for muscle spasms.     insulin regular human CONCENTRATED (HUMULIN R U-500 KWIKPEN) 500 UNIT/ML KwikPen Inject 45 Units into the skin daily with supper. (Patient taking differently: Inject 70-125 Units into the skin 2 (two) times daily with a meal.) 2.7 mL 0   metolazone (ZAROXOLYN) 10 MG tablet Take 10 mg by mouth daily.     nortriptyline (PAMELOR) 10 MG capsule Take 20 mg by mouth at bedtime.     pantoprazole (PROTONIX) 40 MG tablet Take 40 mg by mouth daily before breakfast.     pregabalin (LYRICA) 75 MG capsule Take 1 capsule (75 mg total) by mouth See admin instructions. Daily.  Give after dialysis on dialysis days. 30 capsule 0   sertraline (ZOLOFT) 100 MG tablet Take 100 mg by mouth in the morning.     tamsulosin (FLOMAX) 0.4 MG CAPS capsule Take 1 capsule (0.4 mg total) by mouth daily. 30 capsule 0   torsemide (DEMADEX) 20 MG tablet Take 120 mg by mouth 2 (two) times daily.     traZODone (DESYREL) 150 MG tablet Take 150 mg by mouth at bedtime.     No current facility-administered medications for this visit.    Allergies  Allergen Reactions   Actos [Pioglitazone] Anaphylaxis and Rash   Dexmedetomidine Nausea And Vomiting and Other (See  Comments)    (Precedex) Dose-limiting bradycardia     Ibuprofen Shortness Of Breath, Swelling and Other (See Comments)    Pt tolerates aspirin    Tomato Anaphylaxis and Other (See  Comments)    Only allergic to RAW tomatoes   Wellbutrin [Bupropion] Swelling     REVIEW OF SYSTEMS:   [X]  denotes positive finding, [ ]  denotes negative finding Cardiac  Comments:  Chest pain or chest pressure:    Shortness of breath upon exertion:    Short of breath when lying flat:    Irregular heart rhythm:        Vascular    Pain in calf, thigh, or hip brought on by ambulation:    Pain in feet at night that wakes you up from your sleep:     Blood clot in your veins:    Leg swelling:         Pulmonary    Oxygen at home:    Productive cough:     Wheezing:         Neurologic    Sudden weakness in arms or legs:     Sudden numbness in arms or legs:     Sudden onset of difficulty speaking or slurred speech:    Temporary loss of vision in one eye:     Problems with dizziness:         Gastrointestinal    Blood in stool:     Vomited blood:         Genitourinary    Burning when urinating:     Blood in urine:        Psychiatric    Major depression:         Hematologic    Bleeding problems:    Problems with blood clotting too easily:        Skin    Rashes or ulcers:        Constitutional    Fever or chills:      PHYSICAL EXAMINATION:  Vitals:   06/12/23 0934  BP: (!) 149/49  Pulse: 85  Temp: 97.6 F (36.4 C)  TempSrc: Temporal  SpO2: 97%    General:  WDWN in NAD; vital signs documented above Gait: Not observed HENT: WNL, normocephalic Pulmonary: normal non-labored breathing , without Rales, rhonchi,  wheezing Cardiac: regular HR Abdomen: soft, NT, no masses Skin: without rashes Vascular Exam/Pulses: Palpable left radial pulse; palpable thrill throughout left arm AV graft Extremities: Incision of left neck well-healed Musculoskeletal: no muscle wasting or  atrophy  Neurologic: A&O X 3 Psychiatric:  The pt has Normal affect.      ASSESSMENT/PLAN:: 62 y.o. male here for suture removal status post left IJ TDC removal requiring cutdown on the cuff on 05/23/23  -Left arm AV graft working well with palpable thrill.  No signs or symptoms of steal syndrome in left hand.  Incision of left neck well-healed.  Sutures were removed.  Continue HD via L arm AVG.  He will follow-up on an as-needed basis.   Emilie Rutter, PA-C [facility] [phone]  Clinic MD:   Myra Gianotti

## 2023-06-13 ENCOUNTER — Emergency Department (HOSPITAL_COMMUNITY)
Admission: EM | Admit: 2023-06-13 | Discharge: 2023-06-13 | Disposition: A | Discharge status: Home / Self Care | Payer: 59 | Attending: Emergency Medicine | Admitting: Emergency Medicine

## 2023-06-13 ENCOUNTER — Other Ambulatory Visit: Payer: Self-pay

## 2023-06-13 DIAGNOSIS — Z992 Dependence on renal dialysis: Secondary | ICD-10-CM | POA: Insufficient documentation

## 2023-06-13 DIAGNOSIS — Z794 Long term (current) use of insulin: Secondary | ICD-10-CM | POA: Insufficient documentation

## 2023-06-13 DIAGNOSIS — N186 End stage renal disease: Secondary | ICD-10-CM | POA: Insufficient documentation

## 2023-06-13 DIAGNOSIS — Z7982 Long term (current) use of aspirin: Secondary | ICD-10-CM | POA: Insufficient documentation

## 2023-06-13 DIAGNOSIS — Z7902 Long term (current) use of antithrombotics/antiplatelets: Secondary | ICD-10-CM | POA: Insufficient documentation

## 2023-06-13 DIAGNOSIS — R0602 Shortness of breath: Secondary | ICD-10-CM | POA: Diagnosis present

## 2023-06-13 MED ORDER — DARBEPOETIN ALFA 150 MCG/0.3ML IJ SOSY
150.0000 ug | PREFILLED_SYRINGE | Freq: Once | INTRAMUSCULAR | Status: DC
  Filled 2023-06-13: qty 0.3

## 2023-06-13 MED ORDER — CHLORHEXIDINE GLUCONATE CLOTH 2 % EX PADS: 6.0000 | MEDICATED_PAD | Freq: Every day | CUTANEOUS | Status: DC

## 2023-06-13 MED ORDER — HEPARIN SODIUM (PORCINE) 1000 UNIT/ML IJ SOLN
INTRAMUSCULAR | Status: AC
  Filled 2023-06-13: qty 2

## 2023-06-13 NOTE — ED Notes (Signed)
 Transported to Dialysis

## 2023-06-13 NOTE — ED Provider Notes (Signed)
[place] EMERGENCY DEPARTMENT AT [facility] Provider Note   CSN: [id number] Arrival date & time: 06/13/23  9528     History  Chief Complaint  Patient presents with   Vascular Access Problem    Jeffrey [name] is a 62 y.o. male.  HPI Patient presents 3 days after most recent ED visit, now with shortness of breath, on his day of dialysis. I saw the patient 3 days ago, he notes that since that time he has felt about the same, without new complaints including fever, chest pain.     Home Medications Prior to Admission medications   Medication Sig Start Date End Date Taking? Authorizing Provider  amLODipine (NORVASC) 10 MG tablet Take 10 mg by mouth every morning. 04/17/20   [provider]  ASPIRIN LOW DOSE 81 MG EC tablet Take 81 mg by mouth in the morning. 02/22/21   [provider]  atorvastatin (LIPITOR) 40 MG tablet Take 40 mg by mouth in the morning. 02/09/21   [provider]  calcium acetate (PHOSLO) 667 MG capsule Take 2 capsules (1,334 mg total) by mouth with breakfast, with lunch, and with evening meal. 04/10/21   Calvert Cantor, MD  Calcium Carbonate Antacid (CALCIUM CARBONATE, DOSED IN MG ELEMENTAL CALCIUM,) 1250 MG/5ML SUSP Take 5 mLs (500 mg of elemental calcium total) by mouth every 6 (six) hours as needed for indigestion. 04/08/23   Rhetta Mura, MD  carvedilol (COREG) 12.5 MG tablet Take 12.5 mg by mouth 2 (two) times daily with a meal. 04/17/20   [provider]  Cholecalciferol (VITAMIN D) 50 MCG (2000 UT) CAPS Take 2,000 Units by mouth daily.    [provider]  clopidogrel (PLAVIX) 75 MG tablet Take 1 tablet (75 mg total) by mouth daily. 11/02/22   Rhyne, Ames Coupe, PA-C  cyclobenzaprine (FLEXERIL) 10 MG tablet Take 10 mg by mouth 3 (three) times daily as needed for muscle spasms. 12/29/21   [provider]  diazepam (VALIUM) 5 MG tablet Take 5 mg by mouth 2 (two) times daily as needed for muscle  spasms. 12/09/21   [provider]  insulin regular human CONCENTRATED (HUMULIN R U-500 KWIKPEN) 500 UNIT/ML KwikPen Inject 45 Units into the skin daily with supper. Patient taking differently: Inject 70-125 Units into the skin 2 (two) times daily with a meal. 09/04/21 08/30/24  [name], MD  metolazone (ZAROXOLYN) 10 MG tablet Take 10 mg by mouth daily. 07/13/21   [provider]  nortriptyline (PAMELOR) 10 MG capsule Take 20 mg by mouth at bedtime. 10/23/19 08/30/24  [provider]  pantoprazole (PROTONIX) 40 MG tablet Take 40 mg by mouth daily before breakfast. 02/09/21   [provider]  pregabalin (LYRICA) 75 MG capsule Take 1 capsule (75 mg total) by mouth See admin instructions. Daily.  Give after dialysis on dialysis days. 01/21/23   [name], MD  sertraline (ZOLOFT) 100 MG tablet Take 100 mg by mouth in the morning. 02/22/21   [provider]  tamsulosin (FLOMAX) 0.4 MG CAPS capsule Take 1 capsule (0.4 mg total) by mouth daily. 11/10/21   [name], MD  torsemide (DEMADEX) 20 MG tablet Take 120 mg by mouth 2 (two) times daily.    [provider]  traZODone (DESYREL) 150 MG tablet Take 150 mg by mouth at bedtime. 12/09/21   [provider]      Allergies    Actos [pioglitazone], Dexmedetomidine, Ibuprofen, Tomato, and Wellbutrin [bupropion]  Review of Systems   Review of Systems  All other systems reviewed and are negative.   Physical Exam Updated Vital Signs BP (!) 140/56 (BP Location: Right Arm)   Pulse 81   Temp 98.3 F (36.8 C) (Oral)   Resp 16   SpO2 100%  Physical Exam Vitals and nursing note reviewed.  Constitutional:      Appearance: He is well-developed.  HENT:     Head: Normocephalic and atraumatic.  Eyes:     Conjunctiva/sclera: Conjunctivae normal.  Neck:     Vascular: No JVD.  Pulmonary:     Effort: No respiratory distress.     Breath sounds: No wheezing.     Comments: No increased  work of breathing Abdominal:     General: There is no distension.  Musculoskeletal:     Comments: Edema to the bilateral lower extremities.  Bilateral amputations.  Skin:    Coloration: Skin is not pale.  Neurological:     Mental Status: He is alert and oriented to person, place, and time.  Psychiatric:        Behavior: Behavior normal.     ED Results / Procedures / Treatments   Labs (all labs ordered are listed, but only abnormal results are displayed) Labs Reviewed  HEPATITIS B SURFACE ANTIGEN  HEPATITIS B SURFACE ANTIBODY, QUANTITATIVE    EKG None  Radiology No results found.  Procedures Procedures    Medications Ordered in ED Medications  Chlorhexidine Gluconate Cloth 2 % PADS 6 each (has no administration in time range)    ED Course/ Medical Decision Making/ A&P                             Medical Decision Making Adult male with end-stage renal disease presents with mild dyspnea, likely chronic, no evidence for decompensated state, no fever, no chest pain.  Patient's physical exam today is reassuring, similar to that of 3 days ago.  Case discussed with nephrology to facilitate dialysis.  Amount and/or Complexity of Data Reviewed External Data Reviewed: notes. Labs: ordered.  Risk Decision regarding hospitalization. Diagnosis or treatment significantly limited by social determinants of health.    Final Clinical Impression(s) / ED Diagnoses Final diagnoses:  SOB (shortness of breath)    Rx / DC Orders ED Discharge Orders     None         Gerhard Munch, MD 06/13/23 [phone]

## 2023-06-13 NOTE — Procedures (Signed)
I was present at this dialysis session. I have reviewed the session itself and made appropriate changes.   Filed Weights    Recent Labs  Lab 06/08/23 0730  NA 133*  K 3.6  CL 89*  CO2 26  GLUCOSE 312*  BUN 68*  CREATININE 4.63*  CALCIUM 8.1*  PHOS 3.4    Recent Labs  Lab 06/06/23 1245 06/08/23 0730  WBC 5.7 8.0  NEUTROABS 4.5 6.3  HGB 8.8* 9.3*  HCT 28.3* 30.3*  MCV 88.2 86.6  PLT 114* 141*    Scheduled Meds:  Chlorhexidine Gluconate Cloth  6 each Topical Q0600   darbepoetin (ARANESP) injection - NON-DIALYSIS  150 mcg Subcutaneous Once in dialysis   heparin sodium (porcine)       Continuous Infusions: PRN Meds:.heparin sodium (porcine)   Anthony Sar, MD [facility] 06/13/2023, 10:57 AM

## 2023-06-13 NOTE — Progress Notes (Signed)
   06/13/23 1345  Vitals  Temp 97.6 F (36.4 C)  Pulse Rate 77  Resp 16  BP 128/63  SpO2 95 %  Post Treatment  Dialyzer Clearance Clear  Duration of HD Treatment -hour(s) 3.25 hour(s)  Hemodialysis Intake (mL) 0 mL  Liters Processed 78  Fluid Removed (mL) 3000 mL  Tolerated HD Treatment Yes  AVG/AVF Arterial Site Held (minutes) 8 minutes  AVG/AVF Venous Site Held (minutes) 8 minutes   Received patient in bed to unit.  Alert and oriented.  Informed consent signed and in chart.   TX duration:3.25jr  Patient tolerated well.  Transported back to the room  Alert, without acute distress.  Hand-off given to patient's nurse.   Access used: RAVG Access issues: none  Total UF removed: 3L Medication(s) given: none    [name]

## 2023-06-13 NOTE — ED Notes (Signed)
Patient eloped prior to dc instructions Jeraldine Loots MD was going to discharge patient.

## 2023-06-13 NOTE — ED Triage Notes (Signed)
Pt. Stated, Her for dialysis. Missed on Saturday, I waited til 1200 and I just went on home. I have some swelling but no SOB

## 2023-06-13 NOTE — Plan of Care (Signed)
Jeffrey [name] [birth year] [id number]   Patient presents to the hospital for hospital dialysis.  Orders written for HD today when able.  Usually gets hospital dialysis TTS due to being discharged from outpatient dialysis center 2/2 behavior issues.   Last OP HD orders (dec 2023):  3:15h  129kg  RUE AVG   Heparin 2000     Anemia of ESRD:  Hgb has been in 8-9 range.  Will order ESA today.   Virgina Norfolk, PA-C [employer]

## 2023-06-15 ENCOUNTER — Other Ambulatory Visit: Payer: Self-pay

## 2023-06-15 ENCOUNTER — Emergency Department (HOSPITAL_COMMUNITY)
Admission: EM | Admit: 2023-06-15 | Discharge: 2023-06-15 | Disposition: A | Discharge status: Home / Self Care | Payer: 59 | Attending: Emergency Medicine | Admitting: Emergency Medicine

## 2023-06-15 DIAGNOSIS — Z79899 Other long term (current) drug therapy: Secondary | ICD-10-CM | POA: Insufficient documentation

## 2023-06-15 DIAGNOSIS — Z7982 Long term (current) use of aspirin: Secondary | ICD-10-CM | POA: Diagnosis not present

## 2023-06-15 DIAGNOSIS — N186 End stage renal disease: Secondary | ICD-10-CM | POA: Insufficient documentation

## 2023-06-15 DIAGNOSIS — Z794 Long term (current) use of insulin: Secondary | ICD-10-CM | POA: Insufficient documentation

## 2023-06-15 DIAGNOSIS — I509 Heart failure, unspecified: Secondary | ICD-10-CM | POA: Diagnosis not present

## 2023-06-15 DIAGNOSIS — Z992 Dependence on renal dialysis: Secondary | ICD-10-CM | POA: Diagnosis not present

## 2023-06-15 DIAGNOSIS — J449 Chronic obstructive pulmonary disease, unspecified: Secondary | ICD-10-CM | POA: Insufficient documentation

## 2023-06-15 DIAGNOSIS — E119 Type 2 diabetes mellitus without complications: Secondary | ICD-10-CM | POA: Diagnosis not present

## 2023-06-15 LAB — RENAL FUNCTION PANEL
Albumin: 3.4 g/dL — ABNORMAL LOW (ref 3.5–5.0)
Anion gap: 11 (ref 5–15)
BUN: 70 mg/dL — ABNORMAL HIGH (ref 8–23)
CO2: 26 mmol/L (ref 22–32)
Calcium: 7.6 mg/dL — ABNORMAL LOW (ref 8.9–10.3)
Chloride: 92 mmol/L — ABNORMAL LOW (ref 98–111)
Creatinine, Ser: 4.67 mg/dL — ABNORMAL HIGH (ref 0.61–1.24)
GFR, Estimated: 13 mL/min — ABNORMAL LOW (ref >60)
Glucose, Bld: 431 mg/dL — ABNORMAL HIGH (ref 70–99)
Phosphorus: 4 mg/dL (ref 2.5–4.6)
Potassium: 3.6 mmol/L (ref 3.5–5.1)
Sodium: 129 mmol/L — ABNORMAL LOW (ref 135–145)

## 2023-06-15 LAB — CBC
HCT: 30.5 % — ABNORMAL LOW (ref 39.0–52.0)
Hemoglobin: 9.5 g/dL — ABNORMAL LOW (ref 13.0–17.0)
MCH: 26.8 pg (ref 26.0–34.0)
MCHC: 31.1 g/dL (ref 30.0–36.0)
MCV: 85.9 fL (ref 80.0–100.0)
Platelets: 113 10*3/uL — ABNORMAL LOW (ref 150–400)
RBC: 3.55 MIL/uL — ABNORMAL LOW (ref 4.22–5.81)
RDW: 15.3 % (ref 11.5–15.5)
WBC: 6.5 10*3/uL (ref 4.0–10.5)
nRBC: 0 % (ref 0.0–0.2)

## 2023-06-15 MED ORDER — LIDOCAINE HCL (PF) 1 % IJ SOLN: 5.0000 mL | INTRAMUSCULAR | Status: DC | PRN

## 2023-06-15 MED ORDER — HEPARIN SODIUM (PORCINE) 1000 UNIT/ML DIALYSIS: 1000.0000 [IU] | INTRAMUSCULAR | Status: DC | PRN

## 2023-06-15 MED ORDER — ANTICOAGULANT SODIUM CITRATE 4% (200MG/5ML) IV SOLN
5.0000 mL | Status: DC | PRN
  Filled 2023-06-15: qty 5

## 2023-06-15 MED ORDER — PENTAFLUOROPROP-TETRAFLUOROETH EX AERO: 1.0000 | INHALATION_SPRAY | CUTANEOUS | Status: DC | PRN

## 2023-06-15 MED ORDER — ALTEPLASE 2 MG IJ SOLR: 2.0000 mg | Freq: Once | INTRAMUSCULAR | Status: DC | PRN

## 2023-06-15 MED ORDER — LIDOCAINE-PRILOCAINE 2.5-2.5 % EX CREA: 1.0000 | TOPICAL_CREAM | CUTANEOUS | Status: DC | PRN

## 2023-06-15 MED ORDER — CHLORHEXIDINE GLUCONATE CLOTH 2 % EX PADS: 6.0000 | MEDICATED_PAD | Freq: Every day | CUTANEOUS | Status: DC

## 2023-06-15 MED ORDER — HEPARIN SODIUM (PORCINE) 1000 UNIT/ML DIALYSIS
2000.0000 [IU] | INTRAMUSCULAR | Status: DC | PRN
  Administered 2023-06-15: 2000 [IU] via INTRAVENOUS_CENTRAL
  Filled 2023-06-15 (×2): qty 2

## 2023-06-15 NOTE — Plan of Care (Signed)
Jeffrey [name] September 22, 1961 [id number]     Noted patient in the ED for routine dialysis.   Usually gets dialysis TTS in hospital through the ED due to being discharged from outpatient dialysis center 2/2 behavior issues.  Last HD on 06/13/23.    Last OP HD orders (dec 2023):  3:15h  129kg  RUE AVG   Heparin 2000     Orders written for HD today when spot available.    Virgina Norfolk, PA-C [employer]

## 2023-06-15 NOTE — Progress Notes (Signed)
Received patient in bed to unit.  Alert and oriented.  Informed consent signed and in chart.   TX duration:2:57  Patient tolerated well.  Transported back to the room  Alert, without acute distress.  Hand-off given to patient's nurse.   Access used: left AVG Access issues: none  Total UF removed: 1.8L Medication(s) given: none   06/15/23 1537  Vitals  Temp 98.2 F (36.8 C)  Temp Source Oral  BP (!) 155/70  MAP (mmHg) 92  BP Location Right Arm  BP Method Automatic  Patient Position (if appropriate) Lying  Pulse Rate 81  Pulse Rate Source Monitor  ECG Heart Rate 82  Resp 11  Oxygen Therapy  SpO2 100 %  O2 Device Room Air  O2 Flow Rate (L/min) 3 L/min  Patient Activity (if Appropriate) In bed  Pulse Oximetry Type Continuous  Oximetry Probe Site Changed No  During Treatment Monitoring  HD Safety Checks Performed Yes  Intra-Hemodialysis Comments  (Tx terminated early per pt due to cramping, no form available t sign. pt educated on risk, Virgina Norfolk, PA notified)  Dialysis Fluid Bolus Normal Saline  Bolus Amount (mL) 300 mL  Post Treatment  Dialyzer Clearance Lightly streaked  Duration of HD Treatment -hour(s)  (2:57)  Hemodialysis Intake (mL) 0 mL  Liters Processed 70.9  Fluid Removed (mL) 1800 mL  AVG/AVF Arterial Site Held (minutes) 10 minutes  AVG/AVF Venous Site Held (minutes) 10 minutes      [facility]

## 2023-06-15 NOTE — ED Provider Notes (Signed)
[place] EMERGENCY DEPARTMENT AT [facility] Provider Note   CSN: [id number] Arrival date & time: 06/15/23  0730     History  Chief Complaint  Patient presents with   Vascular Access Problem    Jeffrey [name] is a 62 y.o. male.  With past medical history of congestive heart failure, COPD, diabetes, end-stage renal disease on hemodialysis Tuesday Thursday Saturday who presents to the emergency department for dialysis.  States here is here for his routine dialysis. He has no complaints. Denies chest pain, shortness of breath.   HPI     Home Medications Prior to Admission medications   Medication Sig Start Date End Date Taking? Authorizing Provider  amLODipine (NORVASC) 10 MG tablet Take 10 mg by mouth every morning. 04/17/20   [provider]  ASPIRIN LOW DOSE 81 MG EC tablet Take 81 mg by mouth in the morning. 02/22/21   [provider]  atorvastatin (LIPITOR) 40 MG tablet Take 40 mg by mouth in the morning. 02/09/21   [provider]  calcium acetate (PHOSLO) 667 MG capsule Take 2 capsules (1,334 mg total) by mouth with breakfast, with lunch, and with evening meal. 04/10/21   Calvert Cantor, MD  Calcium Carbonate Antacid (CALCIUM CARBONATE, DOSED IN MG ELEMENTAL CALCIUM,) 1250 MG/5ML SUSP Take 5 mLs (500 mg of elemental calcium total) by mouth every 6 (six) hours as needed for indigestion. 04/08/23   Rhetta Mura, MD  carvedilol (COREG) 12.5 MG tablet Take 12.5 mg by mouth 2 (two) times daily with a meal. 04/17/20   [provider]  Cholecalciferol (VITAMIN D) 50 MCG (2000 UT) CAPS Take 2,000 Units by mouth daily.    [provider]  clopidogrel (PLAVIX) 75 MG tablet Take 1 tablet (75 mg total) by mouth daily. 11/02/22   Rhyne, Ames Coupe, PA-C  cyclobenzaprine (FLEXERIL) 10 MG tablet Take 10 mg by mouth 3 (three) times daily as needed for muscle spasms. 12/29/21   [provider]  diazepam (VALIUM) 5 MG tablet Take 5  mg by mouth 2 (two) times daily as needed for muscle spasms. 12/09/21   [provider]  insulin regular human CONCENTRATED (HUMULIN R U-500 KWIKPEN) 500 UNIT/ML KwikPen Inject 45 Units into the skin daily with supper. Patient taking differently: Inject 70-125 Units into the skin 2 (two) times daily with a meal. 09/04/21 08/30/24  [name], MD  metolazone (ZAROXOLYN) 10 MG tablet Take 10 mg by mouth daily. 07/13/21   [provider]  nortriptyline (PAMELOR) 10 MG capsule Take 20 mg by mouth at bedtime. 10/23/19 08/30/24  [provider]  pantoprazole (PROTONIX) 40 MG tablet Take 40 mg by mouth daily before breakfast. 02/09/21   [provider]  pregabalin (LYRICA) 75 MG capsule Take 1 capsule (75 mg total) by mouth See admin instructions. Daily.  Give after dialysis on dialysis days. 01/21/23   [name], MD  sertraline (ZOLOFT) 100 MG tablet Take 100 mg by mouth in the morning. 02/22/21   [provider]  tamsulosin (FLOMAX) 0.4 MG CAPS capsule Take 1 capsule (0.4 mg total) by mouth daily. 11/10/21   [name], MD  torsemide (DEMADEX) 20 MG tablet Take 120 mg by mouth 2 (two) times daily.    [provider]  traZODone (DESYREL) 150 MG tablet Take 150 mg by mouth at bedtime. 12/09/21   [provider]      Allergies    Actos [pioglitazone], Dexmedetomidine, Ibuprofen, Tomato, and Wellbutrin [bupropion]  Review of Systems   Review of Systems  All other systems reviewed and are negative.   Physical Exam Updated Vital Signs BP (!) 155/70 (BP Location: Right Arm)   Pulse 81   Temp 98.2 F (36.8 C) (Oral)   Resp 11   SpO2 100%  Physical Exam Vitals and nursing note reviewed.  Constitutional:      General: He is not in acute distress.    Appearance: Normal appearance. He is not ill-appearing or toxic-appearing.  HENT:     Head: Normocephalic and atraumatic.  Eyes:     General: No scleral icterus. Pulmonary:      Effort: Pulmonary effort is normal. No respiratory distress.  Skin:    General: Skin is warm and dry.     Findings: No rash.  Neurological:     General: No focal deficit present.     Mental Status: He is alert and oriented to person, place, and time.  Psychiatric:        Mood and Affect: Mood normal.        Behavior: Behavior normal.        Thought Content: Thought content normal.        Judgment: Judgment normal.     ED Results / Procedures / Treatments   Labs (all labs ordered are listed, but only abnormal results are displayed) Labs Reviewed  RENAL FUNCTION PANEL - Abnormal; Notable for the following components:      Result Value   Sodium 129 (*)    Chloride 92 (*)    Glucose, Bld 431 (*)    BUN 70 (*)    Creatinine, Ser 4.67 (*)    Calcium 7.6 (*)    Albumin 3.4 (*)    GFR, Estimated 13 (*)    All other components within normal limits  CBC - Abnormal; Notable for the following components:   RBC 3.55 (*)    Hemoglobin 9.5 (*)    HCT 30.5 (*)    Platelets 113 (*)    All other components within normal limits  HEPATITIS B SURFACE ANTIGEN  HEPATITIS B SURFACE ANTIBODY, QUANTITATIVE    EKG None  Radiology No results found.  Procedures Procedures   Medications Ordered in ED Medications  Chlorhexidine Gluconate Cloth 2 % PADS 6 each (has no administration in time range)  pentafluoroprop-tetrafluoroeth (GEBAUERS) aerosol 1 Application (has no administration in time range)  lidocaine (PF) (XYLOCAINE) 1 % injection 5 mL (has no administration in time range)  lidocaine-prilocaine (EMLA) cream 1 Application (has no administration in time range)  heparin injection 1,000 Units (has no administration in time range)  anticoagulant sodium citrate solution 5 mL (has no administration in time range)  alteplase (CATHFLO ACTIVASE) injection 2 mg (has no administration in time range)  heparin injection 2,000 Units (2,000 Units Dialysis Given 06/15/23 1228)    ED Course/  Medical Decision Making/ A&P Clinical Course as of 06/15/23 1543  Thu Jun 15, 2023  0801 Paged nephrology [LA]    Clinical Course User Index [LA] Cristopher Peru, PA-C    Medical Decision Making Initial Impression and Ddx 62 year old male who presents to the emergency department for dialysis  Patient PMH that increases complexity of ED encounter: CHF, COPD, end-stage renal disease on hemodialysis, MI  Interpretation of Diagnostics I independent reviewed and interpreted the labs as followed: not indicated  - I independently visualized the following imaging with scope of interpretation limited to determining acute life threatening conditions related to emergency care: not  indicated   Patient Reassessment and Ultimate Disposition/Management 62 year old male who presents to the emergency department for hemodialysis.  Well appearing. He is in his home wheelchair on home O2.  Nonseptic, nontoxic appearing.  He is hemodynamically stable.  He has no complaints.  Consulted nephrology for hemodialysis, patient being transported for dialysis and will return to ED after.   Patient management required discussion with the following services or consulting groups:  Nephrology  Complexity of Problems Addressed Chronic illness with exacerbation  Additional Data Reviewed and Analyzed Further history obtained from: Past medical history and medications listed in the EMR, Prior ED visit notes, and Care Everywhere  Patient Encounter Risk Assessment Minor Procedures and SDOH impact on management  Final Clinical Impression(s) / ED Diagnoses Final diagnoses:  ESRD (end stage renal disease) on dialysis [facility])    Rx / DC Orders ED Discharge Orders     None         Cristopher Peru, PA-C 06/15/23 1544    Tegeler, Canary Brim, MD 06/22/23 [phone]

## 2023-06-15 NOTE — ED Notes (Signed)
Transported to dialysis.

## 2023-06-15 NOTE — ED Notes (Signed)
Alert and oriented.  Dr Jacqulyn Bath assessed him prior to leaving

## 2023-06-15 NOTE — ED Triage Notes (Signed)
Pt. Here for dialysis. Last treatment June 13, 2023. No problems and completed treatment.

## 2023-06-15 NOTE — ED Provider Notes (Signed)
Blood pressure (!) 155/70, pulse 81, temperature 98.2 F (36.8 C), temperature source Oral, resp. rate 11, SpO2 100%.  In short, Jeffrey [name] is a 62 y.o. male with a chief complaint of Vascular Access Problem .  Refer to the original H&P for additional details.  04:20 PM  Patient returned from dialysis.  He is feeling well.  This was routine dialysis for him.  Appears to be at baseline and stable for discharge.    Maia Plan, MD 06/15/23 1620

## 2023-06-15 NOTE — Procedures (Signed)
I was present at this dialysis session. I have reviewed the session itself and made appropriate changes.   Filed Weights    No results for input(s): "NA", "K", "CL", "CO2", "GLUCOSE", "BUN", "CREATININE", "CALCIUM", "PHOS" in the last 168 hours.  Invalid input(s): "ALB"  No results for input(s): "WBC", "NEUTROABS", "HGB", "HCT", "MCV", "PLT" in the last 168 hours.  Scheduled Meds:  Chlorhexidine Gluconate Cloth  6 each Topical Q0600   Continuous Infusions:  anticoagulant sodium citrate     PRN Meds:.alteplase, anticoagulant sodium citrate, heparin, heparin, lidocaine (PF), lidocaine-prilocaine, pentafluoroprop-tetrafluoroeth   Anthony Sar, MD [facility] 06/15/2023, 1:32 PM

## 2023-06-17 ENCOUNTER — Emergency Department (HOSPITAL_COMMUNITY)
Admission: EM | Admit: 2023-06-17 | Discharge: 2023-06-17 | Disposition: A | Discharge status: Home / Self Care | Payer: 59 | Attending: Emergency Medicine | Admitting: Emergency Medicine

## 2023-06-17 DIAGNOSIS — N186 End stage renal disease: Secondary | ICD-10-CM | POA: Insufficient documentation

## 2023-06-17 DIAGNOSIS — Z452 Encounter for adjustment and management of vascular access device: Secondary | ICD-10-CM | POA: Diagnosis present

## 2023-06-17 DIAGNOSIS — Z992 Dependence on renal dialysis: Secondary | ICD-10-CM | POA: Insufficient documentation

## 2023-06-17 DIAGNOSIS — E1122 Type 2 diabetes mellitus with diabetic chronic kidney disease: Secondary | ICD-10-CM | POA: Insufficient documentation

## 2023-06-17 DIAGNOSIS — I120 Hypertensive chronic kidney disease with stage 5 chronic kidney disease or end stage renal disease: Secondary | ICD-10-CM | POA: Insufficient documentation

## 2023-06-17 DIAGNOSIS — Z794 Long term (current) use of insulin: Secondary | ICD-10-CM | POA: Insufficient documentation

## 2023-06-17 DIAGNOSIS — J449 Chronic obstructive pulmonary disease, unspecified: Secondary | ICD-10-CM | POA: Diagnosis not present

## 2023-06-17 DIAGNOSIS — Z7982 Long term (current) use of aspirin: Secondary | ICD-10-CM | POA: Diagnosis not present

## 2023-06-17 MED ORDER — CHLORHEXIDINE GLUCONATE CLOTH 2 % EX PADS: 6.0000 | MEDICATED_PAD | Freq: Every day | CUTANEOUS | Status: DC

## 2023-06-17 NOTE — Plan of Care (Signed)
Noted patient in the ED for routine dialysis.   Usually gets dialysis TTS in hospital through the ED due to being discharged from outpatient dialysis center 2/2 behavior issues.  Last HD on 06/15/23.     Last OP HD orders (dec 2023):  3:15h  129kg  RUE AVG   Heparin 2000     Orders written for HD today, can return to ED post HD for reevaluation and discharge if stable.   Will follow up labs today and start ESA if Hgb is still <10.  [name], PA-C 06/17/2023, 10:44 AM  [facility] Pager: [phone]

## 2023-06-17 NOTE — ED Provider Notes (Signed)
[place] EMERGENCY DEPARTMENT AT [facility] Provider Note   CSN: [id number] Arrival date & time: 06/17/23  9563     History  Chief Complaint  Patient presents with   Vascular Access Problem    MIKYLE SOX is a 62 y.o. male with past medical history significant for hypertension, ESRD, COPD, diabetes, bilateral LE amputations who presents for his regular dialysis.  Patient does not currently have his own dialysis center and has been coming here for his regularly scheduled dialysis recently.  He denies any acute complaint at this time.  He reports some chronic shortness of breath, some edema bilateral lower extremities.  No significant worsening compared to his baseline.  HPI     Home Medications Prior to Admission medications   Medication Sig Start Date End Date Taking? Authorizing Provider  amLODipine (NORVASC) 10 MG tablet Take 10 mg by mouth every morning. 04/17/20   [provider]  ASPIRIN LOW DOSE 81 MG EC tablet Take 81 mg by mouth in the morning. 02/22/21   [provider]  atorvastatin (LIPITOR) 40 MG tablet Take 40 mg by mouth in the morning. 02/09/21   [provider]  calcium acetate (PHOSLO) 667 MG capsule Take 2 capsules (1,334 mg total) by mouth with breakfast, with lunch, and with evening meal. 04/10/21   Calvert Cantor, MD  Calcium Carbonate Antacid (CALCIUM CARBONATE, DOSED IN MG ELEMENTAL CALCIUM,) 1250 MG/5ML SUSP Take 5 mLs (500 mg of elemental calcium total) by mouth every 6 (six) hours as needed for indigestion. 04/08/23   Rhetta Mura, MD  carvedilol (COREG) 12.5 MG tablet Take 12.5 mg by mouth 2 (two) times daily with a meal. 04/17/20   [provider]  Cholecalciferol (VITAMIN D) 50 MCG (2000 UT) CAPS Take 2,000 Units by mouth daily.    [provider]  clopidogrel (PLAVIX) 75 MG tablet Take 1 tablet (75 mg total) by mouth daily. 11/02/22   Rhyne, Ames Coupe, PA-C  cyclobenzaprine (FLEXERIL) 10 MG  tablet Take 10 mg by mouth 3 (three) times daily as needed for muscle spasms. 12/29/21   [provider]  diazepam (VALIUM) 5 MG tablet Take 5 mg by mouth 2 (two) times daily as needed for muscle spasms. 12/09/21   [provider]  insulin regular human CONCENTRATED (HUMULIN R U-500 KWIKPEN) 500 UNIT/ML KwikPen Inject 45 Units into the skin daily with supper. Patient taking differently: Inject 70-125 Units into the skin 2 (two) times daily with a meal. 09/04/21 08/30/24  [name], MD  metolazone (ZAROXOLYN) 10 MG tablet Take 10 mg by mouth daily. 07/13/21   [provider]  nortriptyline (PAMELOR) 10 MG capsule Take 20 mg by mouth at bedtime. 10/23/19 08/30/24  [provider]  pantoprazole (PROTONIX) 40 MG tablet Take 40 mg by mouth daily before breakfast. 02/09/21   [provider]  pregabalin (LYRICA) 75 MG capsule Take 1 capsule (75 mg total) by mouth See admin instructions. Daily.  Give after dialysis on dialysis days. 01/21/23   [name], MD  sertraline (ZOLOFT) 100 MG tablet Take 100 mg by mouth in the morning. 02/22/21   [provider]  tamsulosin (FLOMAX) 0.4 MG CAPS capsule Take 1 capsule (0.4 mg total) by mouth daily. 11/10/21   [name], MD  torsemide (DEMADEX) 20 MG tablet Take 120 mg by mouth 2 (two) times daily.    [provider]  traZODone (DESYREL) 150 MG tablet Take 150 mg by mouth at bedtime. 12/09/21  [provider]      Allergies    Actos [pioglitazone], Dexmedetomidine, Ibuprofen, Tomato, and Wellbutrin [bupropion]    Review of Systems   Review of Systems  All other systems reviewed and are negative.   Physical Exam Updated Vital Signs BP (!) 146/55 (BP Location: Right Arm)   Pulse 86   Temp 99.3 F (37.4 C) (Oral)   Resp 20   SpO2 100%  Physical Exam Vitals and nursing note reviewed.  Constitutional:      General: He is not in acute distress.    Appearance: Normal appearance.   HENT:     Head: Normocephalic and atraumatic.  Eyes:     General:        Right eye: No discharge.        Left eye: No discharge.  Cardiovascular:     Rate and Rhythm: Normal rate and regular rhythm.     Heart sounds: No murmur heard.    No friction rub. No gallop.  Pulmonary:     Effort: Pulmonary effort is normal.     Breath sounds: Normal breath sounds.     Comments: No wheezing, rhonchi, stridor, rales, no increased work of breathing Abdominal:     General: Bowel sounds are normal.     Palpations: Abdomen is soft.  Musculoskeletal:     Comments: Bilateral lower extremity amputation with edema of leg above the stump  Skin:    General: Skin is warm and dry.     Capillary Refill: Capillary refill takes less than 2 seconds.  Neurological:     Mental Status: He is alert and oriented to person, place, and time.  Psychiatric:        Mood and Affect: Mood normal.        Behavior: Behavior normal.     ED Results / Procedures / Treatments   Labs (all labs ordered are listed, but only abnormal results are displayed) Labs Reviewed - No data to display  EKG None  Radiology No results found.  Procedures Procedures    Medications Ordered in ED Medications  Chlorhexidine Gluconate Cloth 2 % PADS 6 each (0 each Topical Hold 06/17/23 0932)    ED Course/ Medical Decision Making/ A&P                             Medical Decision Making  This is an overall well appearing 62 yo male with PMH significant for  hypertension, ESRD, COPD, diabetes, bilateral LE amputations who presents for routine dialysis. He denies any acute complaint at this time.  I spoke with the nephrologist, Dr. Marisue Humble who will get this patient Dialyzed and then return to the emergency department for discharge.  As he has no acute complaints today and overall clinically appears stable I do not think that any lab work is needed or warranted at this time. Final Clinical Impression(s) / ED Diagnoses Final  diagnoses:  None    Rx / DC Orders ED Discharge Orders     None         Olene Floss, PA-C 06/17/23 1019    Loetta Rough, MD 06/17/23 1447

## 2023-06-17 NOTE — ED Triage Notes (Signed)
Pt. Here for dialysis . Unable to complete due to cramps.

## 2023-06-17 NOTE — ED Notes (Signed)
Pt states he does not want stay and wait any longer for hemodialysis. This RN called hemodialysis and asked what the estimated time was. Pt then stated his ride was waiting on him and could not stay any longer. Pt stated he will be back on Tuesday for treatment. This RN provided pt education and pt went towards lobby. NAD noted.

## 2023-06-20 ENCOUNTER — Emergency Department (HOSPITAL_COMMUNITY)
Admission: EM | Admit: 2023-06-20 | Discharge: 2023-06-20 | Discharge status: Against Medical Advice | Payer: 59 | Attending: Emergency Medicine | Admitting: Emergency Medicine

## 2023-06-20 ENCOUNTER — Encounter (HOSPITAL_COMMUNITY): Payer: Self-pay

## 2023-06-20 ENCOUNTER — Other Ambulatory Visit: Payer: Self-pay

## 2023-06-20 DIAGNOSIS — N186 End stage renal disease: Secondary | ICD-10-CM | POA: Insufficient documentation

## 2023-06-20 DIAGNOSIS — Z5329 Procedure and treatment not carried out because of patient's decision for other reasons: Secondary | ICD-10-CM | POA: Insufficient documentation

## 2023-06-20 DIAGNOSIS — Z992 Dependence on renal dialysis: Secondary | ICD-10-CM | POA: Insufficient documentation

## 2023-06-20 DIAGNOSIS — Z7902 Long term (current) use of antithrombotics/antiplatelets: Secondary | ICD-10-CM | POA: Diagnosis not present

## 2023-06-20 DIAGNOSIS — Z7982 Long term (current) use of aspirin: Secondary | ICD-10-CM | POA: Insufficient documentation

## 2023-06-20 LAB — CBC
HCT: 30.2 % — ABNORMAL LOW (ref 39.0–52.0)
Hemoglobin: 9.4 g/dL — ABNORMAL LOW (ref 13.0–17.0)
MCH: 26.9 pg (ref 26.0–34.0)
MCHC: 31.1 g/dL (ref 30.0–36.0)
MCV: 86.5 fL (ref 80.0–100.0)
Platelets: 139 10*3/uL — ABNORMAL LOW (ref 150–400)
RBC: 3.49 MIL/uL — ABNORMAL LOW (ref 4.22–5.81)
RDW: 14.9 % (ref 11.5–15.5)
WBC: 5.1 10*3/uL (ref 4.0–10.5)
nRBC: 0 % (ref 0.0–0.2)

## 2023-06-20 LAB — RENAL FUNCTION PANEL
Albumin: 3.4 g/dL — ABNORMAL LOW (ref 3.5–5.0)
Anion gap: 12 (ref 5–15)
BUN: 67 mg/dL — ABNORMAL HIGH (ref 8–23)
CO2: 26 mmol/L (ref 22–32)
Calcium: 7.9 mg/dL — ABNORMAL LOW (ref 8.9–10.3)
Chloride: 94 mmol/L — ABNORMAL LOW (ref 98–111)
Creatinine, Ser: 4.79 mg/dL — ABNORMAL HIGH (ref 0.61–1.24)
GFR, Estimated: 13 mL/min — ABNORMAL LOW (ref >60)
Glucose, Bld: 286 mg/dL — ABNORMAL HIGH (ref 70–99)
Phosphorus: 3 mg/dL (ref 2.5–4.6)
Potassium: 3.4 mmol/L — ABNORMAL LOW (ref 3.5–5.1)
Sodium: 132 mmol/L — ABNORMAL LOW (ref 135–145)

## 2023-06-20 MED ORDER — PENTAFLUOROPROP-TETRAFLUOROETH EX AERO: 1.0000 | INHALATION_SPRAY | CUTANEOUS | Status: DC | PRN

## 2023-06-20 MED ORDER — LIDOCAINE-PRILOCAINE 2.5-2.5 % EX CREA: 1.0000 | TOPICAL_CREAM | CUTANEOUS | Status: DC | PRN

## 2023-06-20 MED ORDER — DARBEPOETIN ALFA 40 MCG/0.4ML IJ SOSY: 40.0000 ug | PREFILLED_SYRINGE | Freq: Once | INTRAMUSCULAR | Status: DC

## 2023-06-20 MED ORDER — CHLORHEXIDINE GLUCONATE CLOTH 2 % EX PADS: 6.0000 | MEDICATED_PAD | Freq: Every day | CUTANEOUS | Status: DC

## 2023-06-20 MED ORDER — LIDOCAINE HCL (PF) 1 % IJ SOLN: 5.0000 mL | INTRAMUSCULAR | Status: DC | PRN

## 2023-06-20 MED ORDER — ALTEPLASE 2 MG IJ SOLR: 2.0000 mg | Freq: Once | INTRAMUSCULAR | Status: DC | PRN

## 2023-06-20 MED ORDER — ANTICOAGULANT SODIUM CITRATE 4% (200MG/5ML) IV SOLN: 5.0000 mL | Status: DC | PRN

## 2023-06-20 MED ORDER — HEPARIN SODIUM (PORCINE) 1000 UNIT/ML DIALYSIS: 1000.0000 [IU] | INTRAMUSCULAR | Status: DC | PRN

## 2023-06-20 MED ORDER — HEPARIN SODIUM (PORCINE) 1000 UNIT/ML DIALYSIS
2000.0000 [IU] | INTRAMUSCULAR | Status: DC | PRN
  Administered 2023-06-20: 2000 [IU] via INTRAVENOUS_CENTRAL
  Filled 2023-06-20: qty 2

## 2023-06-20 NOTE — ED Provider Notes (Signed)
[place] EMERGENCY DEPARTMENT AT [facility] Provider Note   CSN: [id number] Arrival date & time: 06/20/23  0732     History  Chief Complaint  Patient presents with   needs dialysis    Jeffrey [name] is a 62 y.o. male.  HPI Patient is here for dialysis.  Last dialysis was on Thursday.  He reports he was in the emergency department Saturday for dialysis but did not get a turn.  He typically comes to the emergency department for dialysis and does not go to a center.  He denies any symptoms.  Reports he feels well at baseline.  Denies any shortness of breath or chest pain.  Denies fever chills nausea vomiting.    Home Medications Prior to Admission medications   Medication Sig Start Date End Date Taking? Authorizing Provider  amLODipine (NORVASC) 10 MG tablet Take 10 mg by mouth every morning. 04/17/20   [provider]  ASPIRIN LOW DOSE 81 MG EC tablet Take 81 mg by mouth in the morning. 02/22/21   [provider]  atorvastatin (LIPITOR) 40 MG tablet Take 40 mg by mouth in the morning. 02/09/21   [provider]  calcium acetate (PHOSLO) 667 MG capsule Take 2 capsules (1,334 mg total) by mouth with breakfast, with lunch, and with evening meal. 04/10/21   Calvert Cantor, MD  Calcium Carbonate Antacid (CALCIUM CARBONATE, DOSED IN MG ELEMENTAL CALCIUM,) 1250 MG/5ML SUSP Take 5 mLs (500 mg of elemental calcium total) by mouth every 6 (six) hours as needed for indigestion. 04/08/23   Rhetta Mura, MD  carvedilol (COREG) 12.5 MG tablet Take 12.5 mg by mouth 2 (two) times daily with a meal. 04/17/20   [provider]  Cholecalciferol (VITAMIN D) 50 MCG (2000 UT) CAPS Take 2,000 Units by mouth daily.    [provider]  clopidogrel (PLAVIX) 75 MG tablet Take 1 tablet (75 mg total) by mouth daily. 11/02/22   Rhyne, Ames Coupe, PA-C  cyclobenzaprine (FLEXERIL) 10 MG tablet Take 10 mg by mouth 3 (three) times daily as needed for muscle  spasms. 12/29/21   [provider]  diazepam (VALIUM) 5 MG tablet Take 5 mg by mouth 2 (two) times daily as needed for muscle spasms. 12/09/21   [provider]  insulin regular human CONCENTRATED (HUMULIN R U-500 KWIKPEN) 500 UNIT/ML KwikPen Inject 45 Units into the skin daily with supper. Patient taking differently: Inject 70-125 Units into the skin 2 (two) times daily with a meal. 09/04/21 08/30/24  [name], MD  metolazone (ZAROXOLYN) 10 MG tablet Take 10 mg by mouth daily. 07/13/21   [provider]  nortriptyline (PAMELOR) 10 MG capsule Take 20 mg by mouth at bedtime. 10/23/19 08/30/24  [provider]  pantoprazole (PROTONIX) 40 MG tablet Take 40 mg by mouth daily before breakfast. 02/09/21   [provider]  pregabalin (LYRICA) 75 MG capsule Take 1 capsule (75 mg total) by mouth See admin instructions. Daily.  Give after dialysis on dialysis days. 01/21/23   [name], MD  sertraline (ZOLOFT) 100 MG tablet Take 100 mg by mouth in the morning. 02/22/21   [provider]  tamsulosin (FLOMAX) 0.4 MG CAPS capsule Take 1 capsule (0.4 mg total) by mouth daily. 11/10/21   [name], MD  torsemide (DEMADEX) 20 MG tablet Take 120 mg by mouth 2 (two) times daily.    [provider]  traZODone (DESYREL) 150 MG tablet Take 150 mg by mouth at bedtime.  12/09/21   [provider]      Allergies    Actos [pioglitazone], Dexmedetomidine, Ibuprofen, Tomato, and Wellbutrin [bupropion]    Review of Systems   Review of Systems  Physical Exam Updated Vital Signs BP (!) 165/63   Pulse 86   Temp 98.1 F (36.7 C) (Oral)   Resp 20   Ht 5\' 5"  (1.651 m)   Wt 135.2 kg   SpO2 100%   BMI 49.60 kg/m  Physical Exam Constitutional:      Comments: Alert nontoxic clinically well in appearance.  Morbid obesity.  Wheelchair-bound.  HENT:     Mouth/Throat:     Pharynx: Oropharynx is clear.  Cardiovascular:     Rate and Rhythm:  Normal rate and regular rhythm.  Pulmonary:     Effort: Pulmonary effort is normal.     Breath sounds: Normal breath sounds.  Musculoskeletal:     Comments: AKA on the left.  Chronic 2+ edema on the right.  Patient ports this is baseline.     ED Results / Procedures / Treatments   Labs (all labs ordered are listed, but only abnormal results are displayed) Labs Reviewed - No data to display  EKG None  Radiology No results found.  Procedures Procedures    Medications Ordered in ED Medications - No data to display  ED Course/ Medical Decision Making/ A&P                             Medical Decision Making  Patient presenting for dialysis.  No acute complaints.  No acute complaints.  No diagnostic imaging needed for shortness of breath.  Per Dr. Arlean Hopping we do not need to do labs in the ED they will do labs for dialysis.  Consult: Reviewed with Dr. Arta Silence.  As he will arrange dialysis do not need to do labs in the ED.        Final Clinical Impression(s) / ED Diagnoses Final diagnoses:  ESRD needing dialysis [facility])    Rx / DC Orders ED Discharge Orders     None         Arby Barrette, MD 06/20/23 [phone]

## 2023-06-20 NOTE — Procedures (Addendum)
We were asked to see this patient for dialysis. Pt was discharged from his OP HD unit in December 2023 due to behavioral issues. He no longer has a home outpatient HD unit. Pt came to ED today requesting hospital dialysis. The plan will be for "ED HD". Patient is not being admitted. Pt will go to the dialysis unit upstairs when they are ready for the pt. Then the pt will get dialysis. When dialysis is completed pt will be sent back to ED for reassessment.     Last OP HD orders (dec 2023):  3:15h  129kg  RUE AVG   Heparin 2000     - last hep B labs done here on --> 06/01/23   - pt typically comes on Tu, Th and Sat for hospital HD   Anemia of esrd:  Pt had darbe in late April and in early May, but Hb continued to drop, so dosing of darbepoeitin was increased up to 100.   Tsat was low in May at 11% --> we gave him 1 gm load over 4 HD sessions. Repeat tsat on 6/25 was not much better at 12%, ferr 450. Will plan another 250mg  ferrlecit x 4 sessions starting today 06/06/23.    Hb has been trending up in mid June but seems stuck in the 8-9 range. Will increase to 150 mcg po weekly  or biweekly.    The darbepoeitin needs to ordered for this patient as "once in dialysis" and not "SQ at 1800" because he needs to be ready to leave at 5 pm to catch his ride home.    HD access: Pt had new LUA AVG and [facility]J TDC placed on 04/07/23 by Dr Edilia Bo. TDC was removed on 05/23/23. Using AVG now.       Date             Hb         Tsat/ ferritin    Darbe SQ       ferric gluconate 12/02/22            11.0       22%/ 935 01/24/23           9.0        31%/ 1056   03/16/23            8.5                             60 mcg 03/30/23            8.0                             60 mcg  04/11/23            8.1        11%/ 545      100 mcg 5/18                                                                             250mg  #1 04/24/23             8.0  60 mcg  04/27/23             7.3                                                       250mg  #2 04/29/23                                                100 mcg           250mg  #3       6/06                 7.5                               150 mcg 6/08                 7.9                                                        250mg  #4 05/11/23            8.1                               05/18/23            8.2                                150 mcg          250mg   6/25                                  12%/ 450 05/27/23                                                  100 mcg 05/31/23            8.4                                 150 mcg           250mg        Rob Naesha Buckalew  MD  [facility] 06/20/2023, 3:23 PM  Recent Labs  Lab 06/15/23 1238 06/20/23 1050 06/20/23 1051  HGB 9.5*  --  9.4*  ALBUMIN 3.4* 3.4*  --   CALCIUM 7.6* 7.9*  --   PHOS 4.0 3.0  --   CREATININE 4.67* 4.79*  --   K 3.6 3.4*  --     Inpatient medications:  Chlorhexidine Gluconate Cloth  6 each Topical Q0600   darbepoetin (ARANESP) injection -  DIALYSIS  40 mcg Subcutaneous Once

## 2023-06-20 NOTE — Progress Notes (Signed)
   06/20/23 1359  Vitals  Temp 97.7 F (36.5 C)  Pulse Rate 87  Resp 18  BP (!) 138/58  SpO2 100 %  O2 Device Nasal Cannula  Type of Weight Post-Dialysis  Oxygen Therapy  Patient Activity (if Appropriate) In bed  Pulse Oximetry Type Continuous  Post Treatment  Duration of HD Treatment -hour(s) 3.25 hour(s)  Liters Processed 78  Fluid Removed (mL) 3000 mL  Tolerated HD Treatment Yes  AVG/AVF Arterial Site Held (minutes) 5 minutes  AVG/AVF Venous Site Held (minutes) 5 minutes   Received patient in bed to unit.  Alert and oriented.  Informed consent signed and in chart.   TX duration:3.25hrs  Patient tolerated well.  Transported back to the room  Alert, without acute distress.  Hand-off given to patient's nurse.   Access used: LAVG Access issues: none  Total UF removed: 3L Medication(s) given: none    [facility]

## 2023-06-20 NOTE — ED Triage Notes (Signed)
Pt here for dialysis. Pt denies complaints.

## 2023-06-22 ENCOUNTER — Encounter (HOSPITAL_COMMUNITY): Payer: Self-pay | Admitting: Emergency Medicine

## 2023-06-22 ENCOUNTER — Emergency Department (HOSPITAL_COMMUNITY)
Admission: EM | Admit: 2023-06-22 | Discharge: 2023-06-23 | Disposition: A | Discharge status: Home / Self Care | Payer: 59 | Attending: Emergency Medicine | Admitting: Emergency Medicine

## 2023-06-22 ENCOUNTER — Other Ambulatory Visit: Payer: Self-pay

## 2023-06-22 DIAGNOSIS — D631 Anemia in chronic kidney disease: Secondary | ICD-10-CM | POA: Insufficient documentation

## 2023-06-22 DIAGNOSIS — J449 Chronic obstructive pulmonary disease, unspecified: Secondary | ICD-10-CM | POA: Insufficient documentation

## 2023-06-22 DIAGNOSIS — Z992 Dependence on renal dialysis: Secondary | ICD-10-CM | POA: Insufficient documentation

## 2023-06-22 DIAGNOSIS — Z7982 Long term (current) use of aspirin: Secondary | ICD-10-CM | POA: Diagnosis not present

## 2023-06-22 DIAGNOSIS — G4733 Obstructive sleep apnea (adult) (pediatric): Secondary | ICD-10-CM | POA: Insufficient documentation

## 2023-06-22 DIAGNOSIS — Z79899 Other long term (current) drug therapy: Secondary | ICD-10-CM | POA: Diagnosis not present

## 2023-06-22 DIAGNOSIS — K219 Gastro-esophageal reflux disease without esophagitis: Secondary | ICD-10-CM | POA: Insufficient documentation

## 2023-06-22 DIAGNOSIS — N186 End stage renal disease: Secondary | ICD-10-CM | POA: Diagnosis not present

## 2023-06-22 DIAGNOSIS — Z7902 Long term (current) use of antithrombotics/antiplatelets: Secondary | ICD-10-CM | POA: Diagnosis not present

## 2023-06-22 DIAGNOSIS — Z794 Long term (current) use of insulin: Secondary | ICD-10-CM | POA: Diagnosis not present

## 2023-06-22 DIAGNOSIS — I509 Heart failure, unspecified: Secondary | ICD-10-CM | POA: Diagnosis present

## 2023-06-22 DIAGNOSIS — I252 Old myocardial infarction: Secondary | ICD-10-CM | POA: Diagnosis not present

## 2023-06-22 DIAGNOSIS — E1122 Type 2 diabetes mellitus with diabetic chronic kidney disease: Secondary | ICD-10-CM | POA: Diagnosis not present

## 2023-06-22 DIAGNOSIS — E785 Hyperlipidemia, unspecified: Secondary | ICD-10-CM | POA: Diagnosis not present

## 2023-06-22 DIAGNOSIS — I132 Hypertensive heart and chronic kidney disease with heart failure and with stage 5 chronic kidney disease, or end stage renal disease: Secondary | ICD-10-CM | POA: Insufficient documentation

## 2023-06-22 LAB — RENAL FUNCTION PANEL
Albumin: 3.4 g/dL — ABNORMAL LOW (ref 3.5–5.0)
Anion gap: 13 (ref 5–15)
BUN: 57 mg/dL — ABNORMAL HIGH (ref 8–23)
CO2: 25 mmol/L (ref 22–32)
Calcium: 8.1 mg/dL — ABNORMAL LOW (ref 8.9–10.3)
Chloride: 94 mmol/L — ABNORMAL LOW (ref 98–111)
Creatinine, Ser: 4.63 mg/dL — ABNORMAL HIGH (ref 0.61–1.24)
GFR, Estimated: 13 mL/min — ABNORMAL LOW (ref >60)
Glucose, Bld: 315 mg/dL — ABNORMAL HIGH (ref 70–99)
Phosphorus: 4 mg/dL (ref 2.5–4.6)
Potassium: 3.5 mmol/L (ref 3.5–5.1)
Sodium: 132 mmol/L — ABNORMAL LOW (ref 135–145)

## 2023-06-22 LAB — CBC
HCT: 30.6 % — ABNORMAL LOW (ref 39.0–52.0)
Hemoglobin: 9.6 g/dL — ABNORMAL LOW (ref 13.0–17.0)
MCH: 26.2 pg (ref 26.0–34.0)
MCHC: 31.4 g/dL (ref 30.0–36.0)
MCV: 83.6 fL (ref 80.0–100.0)
Platelets: 152 10*3/uL (ref 150–400)
RBC: 3.66 MIL/uL — ABNORMAL LOW (ref 4.22–5.81)
RDW: 14.6 % (ref 11.5–15.5)
WBC: 6.4 10*3/uL (ref 4.0–10.5)
nRBC: 0 % (ref 0.0–0.2)

## 2023-06-22 MED ORDER — HEPARIN SODIUM (PORCINE) 1000 UNIT/ML IJ SOLN
INTRAMUSCULAR | Status: AC
  Administered 2023-06-22: 1000 [IU]
  Filled 2023-06-22: qty 2

## 2023-06-22 MED ORDER — CHLORHEXIDINE GLUCONATE CLOTH 2 % EX PADS: 6.0000 | MEDICATED_PAD | Freq: Every day | CUTANEOUS | Status: DC

## 2023-06-22 MED ORDER — SODIUM CHLORIDE 0.9 % IV SOLN
250.0000 mg | Freq: Once | INTRAVENOUS | Status: AC
  Administered 2023-06-22: 250 mg via INTRAVENOUS
  Filled 2023-06-22: qty 20

## 2023-06-22 MED ORDER — DARBEPOETIN ALFA 150 MCG/0.3ML IJ SOSY
150.0000 ug | PREFILLED_SYRINGE | Freq: Once | INTRAMUSCULAR | Status: AC
  Administered 2023-06-22: 150 ug via SUBCUTANEOUS
  Filled 2023-06-22: qty 0.3

## 2023-06-22 NOTE — ED Triage Notes (Signed)
Pt presents POV for Dialysis. Denies any complaints. Last dialysis 06/20/23.

## 2023-06-22 NOTE — Progress Notes (Addendum)
Received patient in bed.Awake,alert and oriented x 4 .  Access used. Left AVG that worked well.  Medicines given: Heparin 2,000 units pre-run dose.                             Ferric gluconate IV 250 MG.                             Aranesp sq.  Duration of treatment: 3.25 hours.  Fluid removed : 3 liters.  Hemo comment: Tolerated treatment well.  Hand off to the patient's E.D.  nurse.

## 2023-06-22 NOTE — Procedures (Signed)
We were asked to see this patient for dialysis. Pt was discharged from his OP HD unit in December 2023 due to behavioral issues. He no longer has a home outpatient HD unit. Pt came to ED today requesting hospital dialysis. The plan will be for "ED HD". Patient is not being admitted. Pt will go to the dialysis unit upstairs when they are ready for the pt. Then the pt will get dialysis. When dialysis is completed pt will be sent back to ED for reassessment.      Last OP HD orders (dec 2023):  3:15h  129kg  RUE AVG   Heparin 2000     - last hep B labs done here on --> 06/01/23   - pt typically comes on Tu, Th and Sat for hospital HD   Anemia of esrd:  Pt had darbe in late April and in early May, but Hb continued to drop, so dosing of darbepoeitin was increased up to 100.   Tsat was low in May at 11% --> we gave him 1 gm load over 4 HD sessions. Repeat tsat on 6/25 was not much better at 12%, ferr 450. Will plan another 250mg  ferrlecit x 4 sessions started 7/09.    Hb has been trending up in mid June but seems stuck in the 8-9 range so Darbe was ^d to 150 mcg po weekly.    The darbepoeitin needs to ordered for this patient as "once in dialysis" and not "SQ at 1800" because he needs to be ready to leave at 5 pm to catch his ride home.    HD access: Pt had new LUA AVG and LIJ TDC placed on 04/07/23 by Dr Edilia Bo. TDC was removed on 05/23/23. Using AVG now.       Date             Hb         Tsat/ ferritin    Darbe SQ       ferric gluconate 12/02/22            11.0       22%/ 935 01/24/23           9.0        31%/ 1056   03/16/23            8.5                             60 mcg 03/30/23            8.0                             60 mcg  04/11/23            8.1        11%/ 545      100 mcg 5/18                                                                             250mg  #1 04/24/23             8.0  60 mcg  04/27/23             7.3                                                       250mg  #2 04/29/23                                                100 mcg           250mg  #3       6/06                 7.5                               150 mcg 6/08                 7.9                                                        250mg  #4 05/11/23            8.1                               05/18/23            8.2                                150 mcg          250mg   6/25                                  12%/ 450 05/27/23                                                  100 mcg 05/31/23            8.4                                 150 mcg           250mg      06/06/23                                                    150 mcg         250mg  7/25  150 mcg         250 mg  I was present at this dialysis session, have reviewed the session and made  appropriate changes Vinson Moselle MD  [facility] 06/22/2023, 12:17 PM

## 2023-06-22 NOTE — ED Provider Notes (Signed)
[place] EMERGENCY DEPARTMENT AT [facility] Provider Note   CSN: [id number] Arrival date & time: 06/22/23  0740     History  Chief Complaint  Patient presents with   Needs Dialysis    Jeffrey [name] is a 62 y.o. male.  HPI   62 year old male presents to emergency department with request of need for dialysis.  Patient reports last full dialysis session on 06/20/2023.  Currently without complaint of symptoms and just requesting dialysis.  Denies any fever, cough, shortness of breath, chest pain, worsening lower extremity swelling from baseline.  Past medical history significant for ESRD on hemodialysis, CHF, COPD, diabetes mellitus, GERD, hypertension, MI, OSA,, hyperlipidemia, chronic respiratory failure on 3 L nasal cannula at baseline  Home Medications Prior to Admission medications   Medication Sig Start Date End Date Taking? Authorizing Provider  amLODipine (NORVASC) 10 MG tablet Take 10 mg by mouth every morning. 04/17/20   [provider]  ASPIRIN LOW DOSE 81 MG EC tablet Take 81 mg by mouth in the morning. 02/22/21   [provider]  atorvastatin (LIPITOR) 40 MG tablet Take 40 mg by mouth in the morning. 02/09/21   [provider]  calcium acetate (PHOSLO) 667 MG capsule Take 2 capsules (1,334 mg total) by mouth with breakfast, with lunch, and with evening meal. 04/10/21   Calvert Cantor, MD  Calcium Carbonate Antacid (CALCIUM CARBONATE, DOSED IN MG ELEMENTAL CALCIUM,) 1250 MG/5ML SUSP Take 5 mLs (500 mg of elemental calcium total) by mouth every 6 (six) hours as needed for indigestion. 04/08/23   Rhetta Mura, MD  carvedilol (COREG) 12.5 MG tablet Take 12.5 mg by mouth 2 (two) times daily with a meal. 04/17/20   [provider]  Cholecalciferol (VITAMIN D) 50 MCG (2000 UT) CAPS Take 2,000 Units by mouth daily.    [provider]  clopidogrel (PLAVIX) 75 MG tablet Take 1 tablet (75 mg total) by mouth daily. 11/02/22    Rhyne, Ames Coupe, PA-C  cyclobenzaprine (FLEXERIL) 10 MG tablet Take 10 mg by mouth 3 (three) times daily as needed for muscle spasms. 12/29/21   [provider]  diazepam (VALIUM) 5 MG tablet Take 5 mg by mouth 2 (two) times daily as needed for muscle spasms. 12/09/21   [provider]  insulin regular human CONCENTRATED (HUMULIN R U-500 KWIKPEN) 500 UNIT/ML KwikPen Inject 45 Units into the skin daily with supper. Patient taking differently: Inject 70-125 Units into the skin 2 (two) times daily with a meal. 09/04/21 08/30/24  [name], MD  metolazone (ZAROXOLYN) 10 MG tablet Take 10 mg by mouth daily. 07/13/21   [provider]  nortriptyline (PAMELOR) 10 MG capsule Take 20 mg by mouth at bedtime. 10/23/19 08/30/24  [provider]  pantoprazole (PROTONIX) 40 MG tablet Take 40 mg by mouth daily before breakfast. 02/09/21   [provider]  pregabalin (LYRICA) 75 MG capsule Take 1 capsule (75 mg total) by mouth See admin instructions. Daily.  Give after dialysis on dialysis days. 01/21/23   [name], MD  sertraline (ZOLOFT) 100 MG tablet Take 100 mg by mouth in the morning. 02/22/21   [provider]  tamsulosin (FLOMAX) 0.4 MG CAPS capsule Take 1 capsule (0.4 mg total) by mouth daily. 11/10/21   [name], MD  torsemide (DEMADEX) 20 MG tablet Take 120 mg by mouth 2 (two) times daily.    [provider]  traZODone (DESYREL) 150 MG tablet Take 150 mg by mouth  at bedtime. 12/09/21   [provider]      Allergies    Actos [pioglitazone], Dexmedetomidine, Ibuprofen, Tomato, and Wellbutrin [bupropion]    Review of Systems   Review of Systems  All other systems reviewed and are negative.   Physical Exam Updated Vital Signs BP (!) 127/59 (BP Location: Right Arm)   Pulse 89   Temp 98.2 F (36.8 C) (Oral)   Resp 16   Ht 5\' 5"  (1.651 m)   Wt 135 kg   SpO2 98%   BMI 49.53 kg/m  Physical Exam Vitals and nursing  note reviewed.  Constitutional:      General: He is not in acute distress.    Appearance: He is well-developed.  HENT:     Head: Normocephalic and atraumatic.  Eyes:     Conjunctiva/sclera: Conjunctivae normal.  Cardiovascular:     Rate and Rhythm: Normal rate and regular rhythm.     Heart sounds: No murmur heard. Pulmonary:     Effort: Pulmonary effort is normal. No respiratory distress.  Abdominal:     Palpations: Abdomen is soft.     Tenderness: There is no abdominal tenderness.  Musculoskeletal:        General: No swelling.     Cervical back: Neck supple.     Comments: Left AKA present.  1-2+ pitting edema right lower extremity of which patient states is chronic.  Chronic venous stasis changes to right lower extremity.  Skin:    General: Skin is warm and dry.     Capillary Refill: Capillary refill takes less than 2 seconds.  Neurological:     Mental Status: He is alert.  Psychiatric:        Mood and Affect: Mood normal.     ED Results / Procedures / Treatments   Labs (all labs ordered are listed, but only abnormal results are displayed) Labs Reviewed  CBC  RENAL FUNCTION PANEL    EKG None  Radiology No results found.  Procedures Procedures    Medications Ordered in ED Medications  Chlorhexidine Gluconate Cloth 2 % PADS 6 each (has no administration in time range)  Darbepoetin Alfa (ARANESP) injection 150 mcg (has no administration in time range)  ferric gluconate (FERRLECIT) 250 mg in sodium chloride 0.9 % 250 mL IVPB (has no administration in time range)  heparin sodium (porcine) 1000 UNIT/ML injection (has no administration in time range)    ED Course/ Medical Decision Making/ A&P                             Medical Decision Making  This patient presents to the ED for concern of need for dialysis, this involves an extensive number of treatment options, and is a complaint that carries with it a high risk of complications and morbidity.  The  differential diagnosis includes electrode derangement, CHF, dialysis   Co morbidities that complicate the patient evaluation  See HPI   Additional history obtained:  Additional history obtained from EMR External records from outside source obtained and reviewed including hospital records   Lab Tests:  N/a   Imaging Studies ordered:  N/a   Cardiac Monitoring: / EKG:  The patient was maintained on a cardiac monitor.  I personally viewed and interpreted the cardiac monitored which showed an underlying rhythm of: Sinus rhythm   Consultations Obtained:  See ED course  Problem List / ED Course / Critical interventions / Medication management  Need  for dialysis Reevaluation of the patient showed that the patient stayed the same I have reviewed the patients home medicines and have made adjustments as needed   Social Determinants of Health:  Former cigarette use.  Denies illicit drug use.   Test / Admission - Considered:  Need for dialysis Vitals signs within normal range and stable throughout visit. 62 year old male presents emergency department with request of dialysis.  Patient at baseline health status with no acute complaints otherwise.  Laboratory studies were not ordered as patient has had full dialysis session 2 days ago.  Dr. Vida Roller of nephrology agreed with dialysis.  Patient stable upon transfer to dialysis.         Final Clinical Impression(s) / ED Diagnoses Final diagnoses:  ESRD needing dialysis [facility])    Rx / DC Orders ED Discharge Orders     None         Peter Garter, [place] 06/22/23 1021    Maia Plan, MD 06/30/23 [phone]

## 2023-06-24 ENCOUNTER — Emergency Department (HOSPITAL_COMMUNITY)
Admission: EM | Admit: 2023-06-24 | Discharge: 2023-06-24 | Disposition: A | Discharge status: Home / Self Care | Payer: 59 | Attending: Emergency Medicine | Admitting: Emergency Medicine

## 2023-06-24 ENCOUNTER — Other Ambulatory Visit: Payer: Self-pay

## 2023-06-24 ENCOUNTER — Encounter (HOSPITAL_COMMUNITY): Payer: Self-pay | Admitting: Emergency Medicine

## 2023-06-24 DIAGNOSIS — Z79899 Other long term (current) drug therapy: Secondary | ICD-10-CM | POA: Diagnosis not present

## 2023-06-24 DIAGNOSIS — Z794 Long term (current) use of insulin: Secondary | ICD-10-CM | POA: Insufficient documentation

## 2023-06-24 DIAGNOSIS — Z7982 Long term (current) use of aspirin: Secondary | ICD-10-CM | POA: Insufficient documentation

## 2023-06-24 DIAGNOSIS — Z992 Dependence on renal dialysis: Secondary | ICD-10-CM | POA: Insufficient documentation

## 2023-06-24 DIAGNOSIS — N186 End stage renal disease: Secondary | ICD-10-CM | POA: Insufficient documentation

## 2023-06-24 DIAGNOSIS — J449 Chronic obstructive pulmonary disease, unspecified: Secondary | ICD-10-CM | POA: Insufficient documentation

## 2023-06-24 DIAGNOSIS — Z7902 Long term (current) use of antithrombotics/antiplatelets: Secondary | ICD-10-CM | POA: Diagnosis not present

## 2023-06-24 DIAGNOSIS — E1122 Type 2 diabetes mellitus with diabetic chronic kidney disease: Secondary | ICD-10-CM | POA: Diagnosis not present

## 2023-06-24 MED ORDER — CHLORHEXIDINE GLUCONATE CLOTH 2 % EX PADS: 6.0000 | MEDICATED_PAD | Freq: Every day | CUTANEOUS | Status: DC

## 2023-06-24 NOTE — ED Provider Notes (Signed)
[name] EMERGENCY DEPARTMENT AT [facility] Provider Note   CSN: [id number] Arrival date & time: 06/24/23  0747     History  Chief Complaint  Patient presents with   Vascular Access Problem    EMRE HUBBY is a 62 y.o. male with end-stage renal disease, type 2 diabetes, COPD, hypertension presents with request for dialysis.  He states he gets his dialysis here and was last seen on 7/25.  Request dialysis to be done before noon as he will lose his ride if it goes past noon.  Denies any other complaints.  HPI     Home Medications Prior to Admission medications   Medication Sig Start Date End Date Taking? Authorizing Provider  amLODipine (NORVASC) 10 MG tablet Take 10 mg by mouth every morning. 04/17/20   [provider]  ASPIRIN LOW DOSE 81 MG EC tablet Take 81 mg by mouth in the morning. 02/22/21   [provider]  atorvastatin (LIPITOR) 40 MG tablet Take 40 mg by mouth in the morning. 02/09/21   [provider]  calcium acetate (PHOSLO) 667 MG capsule Take 2 capsules (1,334 mg total) by mouth with breakfast, with lunch, and with evening meal. 04/10/21   Calvert Cantor, MD  Calcium Carbonate Antacid (CALCIUM CARBONATE, DOSED IN MG ELEMENTAL CALCIUM,) 1250 MG/5ML SUSP Take 5 mLs (500 mg of elemental calcium total) by mouth every 6 (six) hours as needed for indigestion. 04/08/23   Rhetta Mura, MD  carvedilol (COREG) 12.5 MG tablet Take 12.5 mg by mouth 2 (two) times daily with a meal. 04/17/20   [provider]  Cholecalciferol (VITAMIN D) 50 MCG (2000 UT) CAPS Take 2,000 Units by mouth daily.    [provider]  clopidogrel (PLAVIX) 75 MG tablet Take 1 tablet (75 mg total) by mouth daily. 11/02/22   Rhyne, Ames Coupe, PA-C  cyclobenzaprine (FLEXERIL) 10 MG tablet Take 10 mg by mouth 3 (three) times daily as needed for muscle spasms. 12/29/21   [provider]  diazepam (VALIUM) 5 MG tablet Take 5 mg by mouth 2 (two)  times daily as needed for muscle spasms. 12/09/21   [provider]  insulin regular human CONCENTRATED (HUMULIN R U-500 KWIKPEN) 500 UNIT/ML KwikPen Inject 45 Units into the skin daily with supper. Patient taking differently: Inject 70-125 Units into the skin 2 (two) times daily with a meal. 09/04/21 08/30/24  [name], MD  metolazone (ZAROXOLYN) 10 MG tablet Take 10 mg by mouth daily. 07/13/21   [provider]  nortriptyline (PAMELOR) 10 MG capsule Take 20 mg by mouth at bedtime. 10/23/19 08/30/24  [provider]  pantoprazole (PROTONIX) 40 MG tablet Take 40 mg by mouth daily before breakfast. 02/09/21   [provider]  pregabalin (LYRICA) 75 MG capsule Take 1 capsule (75 mg total) by mouth See admin instructions. Daily.  Give after dialysis on dialysis days. 01/21/23   [name], MD  sertraline (ZOLOFT) 100 MG tablet Take 100 mg by mouth in the morning. 02/22/21   [provider]  tamsulosin (FLOMAX) 0.4 MG CAPS capsule Take 1 capsule (0.4 mg total) by mouth daily. 11/10/21   [name], MD  torsemide (DEMADEX) 20 MG tablet Take 120 mg by mouth 2 (two) times daily.    [provider]  traZODone (DESYREL) 150 MG tablet Take 150 mg by mouth at bedtime. 12/09/21   [provider]      Allergies    Actos [pioglitazone], Dexmedetomidine, Ibuprofen,  Tomato, and Wellbutrin [bupropion]    Review of Systems   Review of Systems  All other systems reviewed and are negative.   Physical Exam Updated Vital Signs BP (!) 162/73 (BP Location: Right Arm)   Pulse 91   Temp 98.6 F (37 C)   Resp 18   Ht 5\' 5"  (1.651 m)   Wt 135 kg   SpO2 96%   BMI 49.53 kg/m  Physical Exam Vitals and nursing note reviewed.  Constitutional:      Appearance: Normal appearance.     Comments: Appearing, no acute distress, on 3 L nasal cannula  HENT:     Head: Atraumatic.  Pulmonary:     Effort: Pulmonary effort is normal.  Neurological:      General: No focal deficit present.     Mental Status: He is alert.  Psychiatric:        Mood and Affect: Mood normal.        Behavior: Behavior normal.     ED Results / Procedures / Treatments   Labs (all labs ordered are listed, but only abnormal results are displayed) Labs Reviewed - No data to display  EKG None  Radiology No results found.  Procedures Procedures    Medications Ordered in ED Medications  Chlorhexidine Gluconate Cloth 2 % PADS 6 each (0 each Topical Hold 06/24/23 0937)    ED Course/ Medical Decision Making/ A&P                             Medical Decision Making  62 y.o. male with pertinent past medical history of  end-stage renal disease, type 2 diabetes, COPD, hypertension presents to the ED for concern of requesting dialysis  Differential diagnosis includes but is not limited to ESRD needing dialysis  ED Course:  Presents to the ED and request dialysis.  He states he receives his dialysis here for his end-stage renal disease.  Last dialysis session was 7/25.  He does not receive dialysis in an outpatient setting.  Any other symptoms.  Consulted nephrology who will bring patient in for dialysis 10AM Patient states he will not be able to get a ride after 12.  Reports he will be leaving soon if he has not seen by neurology. I was informed by the technician that patient has eloped from the facility.  Am unsure if nephrology was able to see him.  Was unable to discuss the risks of leaving with the patient.   Impression: Stage renal disease requiring hemodialysis  Disposition:  The patient was discharged home with instructions to back on Tuesdays, Thursdays and Saturdays for hospital hemodialysis. Return precautions given.  Consultations Obtained: I requested consultation with the nephrologist Dr Arlean Hopping,  and discussed lab and imaging findings as well as pertinent plan - they recommend: Bring patient in for dialysis  External records from outside  source obtained and reviewed including previous ER visit for dialysis   Co morbidities that complicate the patient evaluation   end-stage renal disease, type 2 diabetes, COPD, hypertension  Social Determinants of Health:  Transportation              Final Clinical Impression(s) / ED Diagnoses Final diagnoses:  ESRD on dialysis [facility])    Rx / DC Orders ED Discharge Orders     None         Arabella Merles, PA-C 06/24/23 1252    Laurence Spates, MD 06/24/23 1642

## 2023-06-24 NOTE — ED Notes (Signed)
Patient seen leaving ED, states "he can't wait all morning, ride must pick him up before 1200." LWBS

## 2023-06-24 NOTE — ED Triage Notes (Signed)
Pt here for regular dialysis session. NAD, no pain. VSS.

## 2023-06-24 NOTE — Discharge Instructions (Addendum)
Return to the hospital on Tuesdays, Thursdays and Saturdays for hospital hemodialysis.   Return to the ER if you have chest pain, shortness of breath, dizziness, any other new or concerning symptoms.

## 2023-06-27 ENCOUNTER — Encounter (HOSPITAL_COMMUNITY): Payer: Self-pay

## 2023-06-27 ENCOUNTER — Emergency Department (HOSPITAL_COMMUNITY): Payer: 59

## 2023-06-27 ENCOUNTER — Emergency Department (HOSPITAL_COMMUNITY)
Admission: EM | Admit: 2023-06-27 | Discharge: 2023-06-27 | Disposition: A | Discharge status: Home / Self Care | Payer: 59 | Attending: Student | Admitting: Student

## 2023-06-27 ENCOUNTER — Other Ambulatory Visit: Payer: Self-pay

## 2023-06-27 DIAGNOSIS — Z7982 Long term (current) use of aspirin: Secondary | ICD-10-CM | POA: Insufficient documentation

## 2023-06-27 DIAGNOSIS — N186 End stage renal disease: Secondary | ICD-10-CM | POA: Diagnosis not present

## 2023-06-27 DIAGNOSIS — Z992 Dependence on renal dialysis: Secondary | ICD-10-CM | POA: Diagnosis not present

## 2023-06-27 DIAGNOSIS — J449 Chronic obstructive pulmonary disease, unspecified: Secondary | ICD-10-CM | POA: Diagnosis not present

## 2023-06-27 DIAGNOSIS — Z79899 Other long term (current) drug therapy: Secondary | ICD-10-CM | POA: Insufficient documentation

## 2023-06-27 DIAGNOSIS — M13832 Other specified arthritis, left wrist: Secondary | ICD-10-CM | POA: Diagnosis not present

## 2023-06-27 DIAGNOSIS — M25532 Pain in left wrist: Secondary | ICD-10-CM | POA: Diagnosis present

## 2023-06-27 DIAGNOSIS — M19039 Primary osteoarthritis, unspecified wrist: Secondary | ICD-10-CM

## 2023-06-27 MED ORDER — CHLORHEXIDINE GLUCONATE CLOTH 2 % EX PADS: 6.0000 | MEDICATED_PAD | Freq: Every day | CUTANEOUS | Status: DC

## 2023-06-27 MED ORDER — LIDOCAINE-PRILOCAINE 2.5-2.5 % EX CREA: 1.0000 | TOPICAL_CREAM | CUTANEOUS | Status: DC | PRN

## 2023-06-27 MED ORDER — LIDOCAINE HCL (PF) 1 % IJ SOLN: 5.0000 mL | INTRAMUSCULAR | Status: DC | PRN

## 2023-06-27 MED ORDER — ANTICOAGULANT SODIUM CITRATE 4% (200MG/5ML) IV SOLN: 5.0000 mL | Status: DC | PRN

## 2023-06-27 MED ORDER — HEPARIN SODIUM (PORCINE) 1000 UNIT/ML DIALYSIS
3000.0000 [IU] | INTRAMUSCULAR | Status: DC | PRN
  Administered 2023-06-27: 3000 [IU] via INTRAVENOUS_CENTRAL
  Filled 2023-06-27 (×2): qty 3

## 2023-06-27 MED ORDER — DARBEPOETIN ALFA 40 MCG/0.4ML IJ SOSY: 40.0000 ug | PREFILLED_SYRINGE | Freq: Once | INTRAMUSCULAR | Status: DC

## 2023-06-27 MED ORDER — ACETAMINOPHEN 500 MG PO TABS
500.0000 mg | ORAL_TABLET | Freq: Once | ORAL | Status: AC
  Administered 2023-06-27: 500 mg via ORAL
  Filled 2023-06-27: qty 1

## 2023-06-27 MED ORDER — PENTAFLUOROPROP-TETRAFLUOROETH EX AERO: 1.0000 | INHALATION_SPRAY | CUTANEOUS | Status: DC | PRN

## 2023-06-27 MED ORDER — HEPARIN SODIUM (PORCINE) 1000 UNIT/ML DIALYSIS
1000.0000 [IU] | INTRAMUSCULAR | Status: DC | PRN
  Filled 2023-06-27: qty 1

## 2023-06-27 MED ORDER — ALTEPLASE 2 MG IJ SOLR: 2.0000 mg | Freq: Once | INTRAMUSCULAR | Status: DC | PRN

## 2023-06-27 NOTE — ED Provider Notes (Signed)
[place] EMERGENCY DEPARTMENT AT [facility] Provider Note   CSN: [id number] Arrival date & time: 06/27/23  5732     History  Chief Complaint  Patient presents with   Needs Dialysis   Wrist Pain    Jeffrey [name] is a 62 y.o. male with a history of ESRD, COPD, and left above the knee amputation who presents to the ED today for dialysis and left wrist pain.  Patient reports his dialysis treatment was 5 days ago, on 06/22/23.  He was supposed to go in 2 days later but missed treatment.  Additionally, he reports left wrist pain for the past week.  He does not recall any initial injury or trauma to the wrist.  He has tried some topical creams to help with the pain and wears an Ace wrap with some relief.  No other complaints or concerns at this time.    Home Medications Prior to Admission medications   Medication Sig Start Date End Date Taking? Authorizing Provider  amLODipine (NORVASC) 10 MG tablet Take 10 mg by mouth every morning. 04/17/20   [provider]  ASPIRIN LOW DOSE 81 MG EC tablet Take 81 mg by mouth in the morning. 02/22/21   [provider]  atorvastatin (LIPITOR) 40 MG tablet Take 40 mg by mouth in the morning. 02/09/21   [provider]  calcium acetate (PHOSLO) 667 MG capsule Take 2 capsules (1,334 mg total) by mouth with breakfast, with lunch, and with evening meal. 04/10/21   Calvert Cantor, MD  Calcium Carbonate Antacid (CALCIUM CARBONATE, DOSED IN MG ELEMENTAL CALCIUM,) 1250 MG/5ML SUSP Take 5 mLs (500 mg of elemental calcium total) by mouth every 6 (six) hours as needed for indigestion. 04/08/23   Rhetta Mura, MD  carvedilol (COREG) 12.5 MG tablet Take 12.5 mg by mouth 2 (two) times daily with a meal. 04/17/20   [provider]  Cholecalciferol (VITAMIN D) 50 MCG (2000 UT) CAPS Take 2,000 Units by mouth daily.    [provider]  clopidogrel (PLAVIX) 75 MG tablet Take 1 tablet (75 mg total) by mouth daily.  11/02/22   Rhyne, Ames Coupe, PA-C  cyclobenzaprine (FLEXERIL) 10 MG tablet Take 10 mg by mouth 3 (three) times daily as needed for muscle spasms. 12/29/21   [provider]  diazepam (VALIUM) 5 MG tablet Take 5 mg by mouth 2 (two) times daily as needed for muscle spasms. 12/09/21   [provider]  insulin regular human CONCENTRATED (HUMULIN R U-500 KWIKPEN) 500 UNIT/ML KwikPen Inject 45 Units into the skin daily with supper. Patient taking differently: Inject 70-125 Units into the skin 2 (two) times daily with a meal. 09/04/21 08/30/24  [name], MD  metolazone (ZAROXOLYN) 10 MG tablet Take 10 mg by mouth daily. 07/13/21   [provider]  nortriptyline (PAMELOR) 10 MG capsule Take 20 mg by mouth at bedtime. 10/23/19 08/30/24  [provider]  pantoprazole (PROTONIX) 40 MG tablet Take 40 mg by mouth daily before breakfast. 02/09/21   [provider]  pregabalin (LYRICA) 75 MG capsule Take 1 capsule (75 mg total) by mouth See admin instructions. Daily.  Give after dialysis on dialysis days. 01/21/23   [name], MD  sertraline (ZOLOFT) 100 MG tablet Take 100 mg by mouth in the morning. 02/22/21   [provider]  tamsulosin (FLOMAX) 0.4 MG CAPS capsule Take 1 capsule (0.4 mg total) by mouth daily. 11/10/21   [name], MD  torsemide [facility])  20 MG tablet Take 120 mg by mouth 2 (two) times daily.    [provider]  traZODone (DESYREL) 150 MG tablet Take 150 mg by mouth at bedtime. 12/09/21   [provider]      Allergies    Actos [pioglitazone], Dexmedetomidine, Ibuprofen, Tomato, and Wellbutrin [bupropion]    Review of Systems   Review of Systems  Musculoskeletal:        Left wrist pain  Hematological:        Needs dialysis  All other systems reviewed and are negative.   Physical Exam Updated Vital Signs BP (!) 151/75   Pulse 92   Temp 98 F (36.7 C)   Resp 14   SpO2 100%  Physical Exam Vitals and  nursing note reviewed.  Constitutional:      General: He is not in acute distress.    Appearance: Normal appearance.  HENT:     Head: Normocephalic and atraumatic.     Mouth/Throat:     Mouth: Mucous membranes are moist.  Eyes:     Conjunctiva/sclera: Conjunctivae normal.     Pupils: Pupils are equal, round, and reactive to light.  Cardiovascular:     Rate and Rhythm: Normal rate and regular rhythm.     Pulses: Normal pulses.  Pulmonary:     Effort: Pulmonary effort is normal.     Breath sounds: Normal breath sounds.  Abdominal:     Palpations: Abdomen is soft.     Tenderness: There is no abdominal tenderness.  Musculoskeletal:     Comments: Tenderness to palpation to radial aspect of the left wrist on both the dorsal and volar sides.  Full range of motion of the left wrist and hand appreciated. Grip strength is intact bilaterally.  Left above-the-knee amputation.  Skin:    General: Skin is warm and dry.     Findings: No rash.  Neurological:     General: No focal deficit present.     Mental Status: He is alert.     Sensory: No sensory deficit.     Motor: No weakness.  Psychiatric:        Mood and Affect: Mood normal.        Behavior: Behavior normal.     ED Results / Procedures / Treatments   Labs (all labs ordered are listed, but only abnormal results are displayed) Labs Reviewed  HEPATITIS B SURFACE ANTIGEN  HEPATITIS B SURFACE ANTIBODY, QUANTITATIVE  RENAL FUNCTION PANEL  CBC    EKG None  Radiology DG Hand Complete Left  Result Date: 06/27/2023 CLINICAL DATA:  Hand pain.  Wrist pain. EXAM: LEFT WRIST - COMPLETE 3+ VIEW; LEFT HAND - COMPLETE 3+ VIEW COMPARISON:  Left wrist and hand radiographs 05/06/2023 FINDINGS: Left wrist: There is again severe radiocarpal joint space narrowing with diffuse bone-on-bone contact of the distal radius and scaphoid. There is again mild concavity within the distal radial articular surface secondary to chronic abutment with the  proximal scaphoid. High-grade subchondral cystic change in this location at the radioscaphoid articulation. Moderate to severe joint space narrowing between the proximal and distal carpals, especially of the capitate-scaphoid articulation. Mild-to-moderate thumb carpometacarpal joint space narrowing, subchondral sclerosis, and peripheral osteophytosis. No acute fracture is seen. No dislocation. Left hand: There is again mild second and third and minimal fourth and fifth DIP joint space narrowing and peripheral osteophytosis. No acute fracture is seen. No dislocation. IMPRESSION: 1. No acute fracture within the left wrist or left hand. No significant change  from prior. 2. Severe radiocarpal osteoarthritis. 3. Moderate to severe joint space narrowing between the proximal and distal carpals, especially of the capitate-scaphoid articulation. 4. Mild-to-moderate thumb carpometacarpal osteoarthritis. 5. Mild second and third and minimal fourth and fifth DIP osteoarthritis. Electronically Signed   By: Neita Garnet M.D.   On: 06/27/2023 08:49   DG Wrist Complete Left  Result Date: 06/27/2023 CLINICAL DATA:  Hand pain.  Wrist pain. EXAM: LEFT WRIST - COMPLETE 3+ VIEW; LEFT HAND - COMPLETE 3+ VIEW COMPARISON:  Left wrist and hand radiographs 05/06/2023 FINDINGS: Left wrist: There is again severe radiocarpal joint space narrowing with diffuse bone-on-bone contact of the distal radius and scaphoid. There is again mild concavity within the distal radial articular surface secondary to chronic abutment with the proximal scaphoid. High-grade subchondral cystic change in this location at the radioscaphoid articulation. Moderate to severe joint space narrowing between the proximal and distal carpals, especially of the capitate-scaphoid articulation. Mild-to-moderate thumb carpometacarpal joint space narrowing, subchondral sclerosis, and peripheral osteophytosis. No acute fracture is seen. No dislocation. Left hand: There is  again mild second and third and minimal fourth and fifth DIP joint space narrowing and peripheral osteophytosis. No acute fracture is seen. No dislocation. IMPRESSION: 1. No acute fracture within the left wrist or left hand. No significant change from prior. 2. Severe radiocarpal osteoarthritis. 3. Moderate to severe joint space narrowing between the proximal and distal carpals, especially of the capitate-scaphoid articulation. 4. Mild-to-moderate thumb carpometacarpal osteoarthritis. 5. Mild second and third and minimal fourth and fifth DIP osteoarthritis. Electronically Signed   By: Neita Garnet M.D.   On: 06/27/2023 08:49    Procedures Procedures: not indicated.   Medications Ordered in ED Medications  Chlorhexidine Gluconate Cloth 2 % PADS 6 each (has no administration in time range)  pentafluoroprop-tetrafluoroeth (GEBAUERS) aerosol 1 Application (has no administration in time range)  lidocaine (PF) (XYLOCAINE) 1 % injection 5 mL (has no administration in time range)  lidocaine-prilocaine (EMLA) cream 1 Application (has no administration in time range)  heparin injection 1,000 Units (has no administration in time range)  anticoagulant sodium citrate solution 5 mL (has no administration in time range)  alteplase (CATHFLO ACTIVASE) injection 2 mg (has no administration in time range)  heparin injection 3,000 Units (3,000 Units Dialysis Given 06/27/23 1320)  acetaminophen (TYLENOL) tablet 500 mg (500 mg Oral Given 06/27/23 1122)    ED Course/ Medical Decision Making/ A&P                                 Medical Decision Making Amount and/or Complexity of Data Reviewed Radiology: ordered.  Risk OTC drugs.   This patient presents to the ED for concern of hemodialysis and left wrist pain, this involves an extensive number of treatment options, and is a complaint that carries with it a high risk of complications and morbidity.   Differential diagnosis includes: Fracture, dislocation,  osteoarthritis, muscle strain, etc.   Co morbidities that complicate the patient evaluation  ESRD Left above-the-knee amputation BPH Pulmonary hypertension   Additional history obtained:  Additional history obtained from patient's records.   Lab Tests:  No labs needed at this time.   Imaging Studies ordered:  I ordered imaging studies including left hand and wrist x-ray.  I independently visualized and interpreted imaging which showed: No acute fracture of the left wrist or hand Severe radiocarpal osteoarthritis Moderate to severe joint space narrowing between the proximal  and distal carpals, especially of the capitate-scaphoid articulation Mild to moderate thumb carpometacarpal osteoarthritis Mild second and third and minimal fourth and fifth DIP osteoarthritis I agree with the radiologist interpretation   Consultations Obtained:  I requested consultation with nephrology,  and discussed lab and imaging findings as well as pertinent plan - they recommend: transferring patient upstairs for hemodialysis   Problem List / ED Course / Critical interventions / Medication management  Left wrist pain and needs hemodialysis I ordered medications including: Tylenol for pain Reevaluation of the patient after these medicines showed that the patient improved I have reviewed the patients home medicines and have made adjustments as needed   Social Determinants of Health:  Physical activity   Test / Admission - Considered:  Discussed results of imaging with patient.  Patient provided with wrist brace prior to hemodialysis. Patient stable and safe for transfer upstairs for dialysis.       Final Clinical Impression(s) / ED Diagnoses Final diagnoses:  Need for acute hemodialysis [facility])  Wrist arthritis    Rx / DC Orders ED Discharge Orders     None         Maxwell Marion, PA-C 06/27/23 1446    Glendora Score, MD 06/27/23 1944

## 2023-06-27 NOTE — ED Notes (Signed)
Provided pt a beverage.

## 2023-06-27 NOTE — ED Notes (Signed)
RN provided pt with wrist brace

## 2023-06-27 NOTE — ED Triage Notes (Signed)
Pt needing dialysis, missed Saturdays treatment. Last dialysis was Thursday. Pt also c.o left wrist pain, no known injury, wrapped with ace bandage

## 2023-06-27 NOTE — Progress Notes (Signed)
   06/27/23 1628  Vitals  Temp 98.1 F (36.7 C)  Resp 20  SpO2 100 %  O2 Device Nasal Cannula  Weight  (unable to weigh---ed stretcher)  Type of Weight Post-Dialysis  Oxygen Therapy  O2 Flow Rate (L/min) 2 L/min  Patient Activity (if Appropriate) In bed  Pulse Oximetry Type Continuous  Oximetry Probe Site Changed No  Post Treatment  Dialyzer Clearance Lightly streaked  Duration of HD Treatment -hour(s) 2.5 hour(s)  Hemodialysis Intake (mL) 0 mL  Liters Processed 59.4  Fluid Removed (mL) 2000 mL  Tolerated HD Treatment Yes  Post-Hemodialysis Comments Signed off ama  AVG/AVF Arterial Site Held (minutes) 10 minutes  AVG/AVF Venous Site Held (minutes) 10 minutes   Received patient in bed to unit.  Alert and oriented.  Informed consent signed and in chart.   TX duration:2.50--pt signed off ama  Patient tolerated well.  Transported back to the room ---ed charge hallway Alert, without acute distress.  Hand-off given to patient's nurse.   Access used: LUAF Access issues: no complications  Total UF removed: 2000 Medication(s) given: none   [facility]

## 2023-06-27 NOTE — Discharge Instructions (Addendum)
As discussed, your x-ray shows that you have arthritis in your left wrist as well as fingers.

## 2023-06-27 NOTE — ED Provider Notes (Signed)
Patient is back from dialysis.  Well-appearing.  No complaints.  Discharge.   Virgina Norfolk, DO 06/27/23 1637

## 2023-06-29 ENCOUNTER — Other Ambulatory Visit: Payer: Self-pay

## 2023-06-29 ENCOUNTER — Emergency Department (HOSPITAL_COMMUNITY)
Admission: EM | Admit: 2023-06-29 | Discharge: 2023-06-29 | Discharge status: Against Medical Advice | Payer: 59 | Attending: Emergency Medicine | Admitting: Emergency Medicine

## 2023-06-29 DIAGNOSIS — J449 Chronic obstructive pulmonary disease, unspecified: Secondary | ICD-10-CM | POA: Diagnosis not present

## 2023-06-29 DIAGNOSIS — N186 End stage renal disease: Secondary | ICD-10-CM | POA: Diagnosis not present

## 2023-06-29 DIAGNOSIS — Z992 Dependence on renal dialysis: Secondary | ICD-10-CM | POA: Insufficient documentation

## 2023-06-29 DIAGNOSIS — Z5329 Procedure and treatment not carried out because of patient's decision for other reasons: Secondary | ICD-10-CM | POA: Diagnosis not present

## 2023-06-29 DIAGNOSIS — Z87891 Personal history of nicotine dependence: Secondary | ICD-10-CM | POA: Diagnosis not present

## 2023-06-29 DIAGNOSIS — Z7902 Long term (current) use of antithrombotics/antiplatelets: Secondary | ICD-10-CM | POA: Insufficient documentation

## 2023-06-29 DIAGNOSIS — Z7982 Long term (current) use of aspirin: Secondary | ICD-10-CM | POA: Insufficient documentation

## 2023-06-29 DIAGNOSIS — E1122 Type 2 diabetes mellitus with diabetic chronic kidney disease: Secondary | ICD-10-CM | POA: Insufficient documentation

## 2023-06-29 DIAGNOSIS — I132 Hypertensive heart and chronic kidney disease with heart failure and with stage 5 chronic kidney disease, or end stage renal disease: Secondary | ICD-10-CM | POA: Diagnosis not present

## 2023-06-29 DIAGNOSIS — I509 Heart failure, unspecified: Secondary | ICD-10-CM | POA: Diagnosis not present

## 2023-06-29 DIAGNOSIS — Z79899 Other long term (current) drug therapy: Secondary | ICD-10-CM | POA: Diagnosis not present

## 2023-06-29 MED ORDER — CHLORHEXIDINE GLUCONATE CLOTH 2 % EX PADS: 6.0000 | MEDICATED_PAD | Freq: Every day | CUTANEOUS | Status: DC

## 2023-06-29 MED ORDER — PENTAFLUOROPROP-TETRAFLUOROETH EX AERO: 1.0000 | INHALATION_SPRAY | CUTANEOUS | Status: DC | PRN

## 2023-06-29 MED ORDER — DARBEPOETIN ALFA 150 MCG/0.3ML IJ SOSY
150.0000 ug | PREFILLED_SYRINGE | INTRAMUSCULAR | Status: DC
  Filled 2023-06-29: qty 0.3

## 2023-06-29 MED ORDER — ALTEPLASE 2 MG IJ SOLR: 2.0000 mg | Freq: Once | INTRAMUSCULAR | Status: DC | PRN

## 2023-06-29 MED ORDER — LIDOCAINE HCL (PF) 1 % IJ SOLN: 5.0000 mL | INTRAMUSCULAR | Status: DC | PRN

## 2023-06-29 MED ORDER — ANTICOAGULANT SODIUM CITRATE 4% (200MG/5ML) IV SOLN: 5.0000 mL | Status: DC | PRN

## 2023-06-29 MED ORDER — HEPARIN SODIUM (PORCINE) 1000 UNIT/ML DIALYSIS: 1000.0000 [IU] | INTRAMUSCULAR | Status: DC | PRN

## 2023-06-29 MED ORDER — LIDOCAINE-PRILOCAINE 2.5-2.5 % EX CREA: 1.0000 | TOPICAL_CREAM | CUTANEOUS | Status: DC | PRN

## 2023-06-29 NOTE — ED Provider Notes (Signed)
[place] EMERGENCY DEPARTMENT AT [facility] Provider Note   CSN: [id number] Arrival date & time: 06/29/23  7829     History  Chief Complaint  Patient presents with   Vascular Access Problem    Jeffrey [name] is a 62 y.o. male.  HPI   63 year old male presents emergency department with request for dialysis.  States his last dialysis session was Tuesday of which he sat through all but about 20 minutes of session.  Currently without complaint.  Denies fever, shortness of breath, chest pain, abdominal pain, nausea, vomiting.  States he is just here for dialysis.  Past medical history significant for ESRD on hemodialysis, COPD, CHF, GERD, hypertension, MI, OSA, hyperlipidemia, diabetes mellitus type 2  Home Medications Prior to Admission medications   Medication Sig Start Date End Date Taking? Authorizing Provider  amLODipine (NORVASC) 10 MG tablet Take 10 mg by mouth every morning. 04/17/20   [provider]  ASPIRIN LOW DOSE 81 MG EC tablet Take 81 mg by mouth in the morning. 02/22/21   [provider]  atorvastatin (LIPITOR) 40 MG tablet Take 40 mg by mouth in the morning. 02/09/21   [provider]  calcium acetate (PHOSLO) 667 MG capsule Take 2 capsules (1,334 mg total) by mouth with breakfast, with lunch, and with evening meal. 04/10/21   Calvert Cantor, MD  Calcium Carbonate Antacid (CALCIUM CARBONATE, DOSED IN MG ELEMENTAL CALCIUM,) 1250 MG/5ML SUSP Take 5 mLs (500 mg of elemental calcium total) by mouth every 6 (six) hours as needed for indigestion. 04/08/23   Rhetta Mura, MD  carvedilol (COREG) 12.5 MG tablet Take 12.5 mg by mouth 2 (two) times daily with a meal. 04/17/20   [provider]  Cholecalciferol (VITAMIN D) 50 MCG (2000 UT) CAPS Take 2,000 Units by mouth daily.    [provider]  clopidogrel (PLAVIX) 75 MG tablet Take 1 tablet (75 mg total) by mouth daily. 11/02/22   Rhyne, Ames Coupe, PA-C  cyclobenzaprine  (FLEXERIL) 10 MG tablet Take 10 mg by mouth 3 (three) times daily as needed for muscle spasms. 12/29/21   [provider]  diazepam (VALIUM) 5 MG tablet Take 5 mg by mouth 2 (two) times daily as needed for muscle spasms. 12/09/21   [provider]  insulin regular human CONCENTRATED (HUMULIN R U-500 KWIKPEN) 500 UNIT/ML KwikPen Inject 45 Units into the skin daily with supper. Patient taking differently: Inject 70-125 Units into the skin 2 (two) times daily with a meal. 09/04/21 08/30/24  [name], MD  metolazone (ZAROXOLYN) 10 MG tablet Take 10 mg by mouth daily. 07/13/21   [provider]  nortriptyline (PAMELOR) 10 MG capsule Take 20 mg by mouth at bedtime. 10/23/19 08/30/24  [provider]  pantoprazole (PROTONIX) 40 MG tablet Take 40 mg by mouth daily before breakfast. 02/09/21   [provider]  pregabalin (LYRICA) 75 MG capsule Take 1 capsule (75 mg total) by mouth See admin instructions. Daily.  Give after dialysis on dialysis days. 01/21/23   [name], MD  sertraline (ZOLOFT) 100 MG tablet Take 100 mg by mouth in the morning. 02/22/21   [provider]  tamsulosin (FLOMAX) 0.4 MG CAPS capsule Take 1 capsule (0.4 mg total) by mouth daily. 11/10/21   [name], MD  torsemide (DEMADEX) 20 MG tablet Take 120 mg by mouth 2 (two) times daily.    [provider]  traZODone (DESYREL) 150 MG tablet Take 150 mg by mouth at  bedtime. 12/09/21   [provider]      Allergies    Actos [pioglitazone], Dexmedetomidine, Ibuprofen, Tomato, and Wellbutrin [bupropion]    Review of Systems   Review of Systems  All other systems reviewed and are negative.   Physical Exam Updated Vital Signs BP (!) 158/50   Pulse 93   Temp 98.5 F (36.9 C) (Oral)   Resp 16   Ht 5\' 5"  (1.651 m)   Wt (!) 136.5 kg   SpO2 97%   BMI 50.09 kg/m  Physical Exam Vitals and nursing note reviewed.  Constitutional:      General: He is not in  acute distress.    Appearance: He is well-developed.  HENT:     Head: Normocephalic and atraumatic.  Eyes:     Conjunctiva/sclera: Conjunctivae normal.  Cardiovascular:     Rate and Rhythm: Normal rate and regular rhythm.  Pulmonary:     Effort: Pulmonary effort is normal. No respiratory distress.     Breath sounds: Normal breath sounds.  Abdominal:     Palpations: Abdomen is soft.     Tenderness: There is no abdominal tenderness.  Musculoskeletal:     Cervical back: Neck supple.     Comments: Left AKA.  1-2+ edema right lower extremity which patient states is chronic.  Skin:    General: Skin is warm and dry.     Capillary Refill: Capillary refill takes less than 2 seconds.  Neurological:     Mental Status: He is alert.  Psychiatric:        Mood and Affect: Mood normal.     ED Results / Procedures / Treatments   Labs (all labs ordered are listed, but only abnormal results are displayed) Labs Reviewed  HEPATITIS B SURFACE ANTIGEN  HEPATITIS B SURFACE ANTIBODY, QUANTITATIVE    EKG None  Radiology No results found.  Procedures Procedures    Medications Ordered in ED Medications  Chlorhexidine Gluconate Cloth 2 % PADS 6 each (has no administration in time range)  Darbepoetin Alfa (ARANESP) injection 150 mcg (has no administration in time range)    ED Course/ Medical Decision Making/ A&P Clinical Course as of 06/29/23 0957  Thu Jun 29, 2023  [id number] Consulted Dr. Glenna Fellows of nephrology who agreed with dialysis [CR]    Clinical Course User Index [CR] Peter Garter, PA                                 Medical Decision Making  This patient presents to the ED for concern of dialysis, this involves an extensive number of treatment options, and is a complaint that carries with it a high risk of complications and morbidity.  The differential diagnosis includes dialysis   Co morbidities that complicate the patient evaluation  See HPI   Additional history  obtained:  Additional history obtained from EMR External records from outside source obtained and reviewed including hospital records   Lab Tests:  N/a   Imaging Studies ordered:  N/a   Cardiac Monitoring: / EKG:  The patient was maintained on a cardiac monitor.  I personally viewed and interpreted the cardiac monitored which showed an underlying rhythm of: Sinus rhythm   Consultations Obtained:  See ED course  Problem List / ED Course / Critical interventions / Medication management  ESRD on hemodialysis.  Encounter for dialysis Reevaluation of the patient showed that the patient stayed the same I have  reviewed the patients home medicines and have made adjustments as needed   Social Determinants of Health:  Former cigarette use.  Denies illicit drug use.   Test / Admission - Considered:  ESRD on hemodialysis.  Encounter for dialysis Vitals signs significant for hypertension blood pressure 158 over. Otherwise within normal range and stable throughout visit. 62 year old male presents emergency department with request of dialysis.  Patient asymptomatic otherwise.  Refrain from laboratory studies given the patient received near full session 2 days ago.  Will consult nephrology for dialysis. Nephrology consulted who agreed with dialysis.  Patient stable upon transfer to dialysis center.        Final Clinical Impression(s) / ED Diagnoses Final diagnoses:  ESRD (end stage renal disease) on dialysis [facility])  Encounter for dialysis [facility])    Rx / DC Orders ED Discharge Orders     None         Peter Garter, [place] 06/29/23 1610    [name], MD 07/04/23 1459

## 2023-06-29 NOTE — ED Triage Notes (Signed)
Pt here for dialysis.  Last full tx Tuesday.

## 2023-07-01 ENCOUNTER — Emergency Department (HOSPITAL_COMMUNITY)
Admission: EM | Admit: 2023-07-01 | Discharge: 2023-07-01 | Disposition: A | Discharge status: Home / Self Care | Payer: 59 | Source: Home / Self Care | Attending: Emergency Medicine | Admitting: Emergency Medicine

## 2023-07-01 DIAGNOSIS — I132 Hypertensive heart and chronic kidney disease with heart failure and with stage 5 chronic kidney disease, or end stage renal disease: Secondary | ICD-10-CM | POA: Diagnosis present

## 2023-07-01 DIAGNOSIS — Z794 Long term (current) use of insulin: Secondary | ICD-10-CM | POA: Diagnosis not present

## 2023-07-01 DIAGNOSIS — Z7982 Long term (current) use of aspirin: Secondary | ICD-10-CM | POA: Diagnosis not present

## 2023-07-01 DIAGNOSIS — Z7902 Long term (current) use of antithrombotics/antiplatelets: Secondary | ICD-10-CM | POA: Insufficient documentation

## 2023-07-01 DIAGNOSIS — R609 Edema, unspecified: Secondary | ICD-10-CM | POA: Insufficient documentation

## 2023-07-01 DIAGNOSIS — Z992 Dependence on renal dialysis: Secondary | ICD-10-CM | POA: Insufficient documentation

## 2023-07-01 DIAGNOSIS — E1122 Type 2 diabetes mellitus with diabetic chronic kidney disease: Secondary | ICD-10-CM | POA: Insufficient documentation

## 2023-07-01 DIAGNOSIS — N186 End stage renal disease: Secondary | ICD-10-CM | POA: Insufficient documentation

## 2023-07-01 DIAGNOSIS — I509 Heart failure, unspecified: Secondary | ICD-10-CM | POA: Diagnosis not present

## 2023-07-01 DIAGNOSIS — Z79899 Other long term (current) drug therapy: Secondary | ICD-10-CM | POA: Diagnosis not present

## 2023-07-01 DIAGNOSIS — J449 Chronic obstructive pulmonary disease, unspecified: Secondary | ICD-10-CM | POA: Insufficient documentation

## 2023-07-01 LAB — HEPATITIS B SURFACE ANTIGEN: Hepatitis B Surface Ag: NONREACTIVE

## 2023-07-01 LAB — CBC WITH DIFFERENTIAL/PLATELET
Abs Immature Granulocytes: 0.03 10*3/uL (ref 0.00–0.07)
Basophils Absolute: 0 10*3/uL (ref 0.0–0.1)
Basophils Relative: 0 %
Eosinophils Absolute: 0.2 10*3/uL (ref 0.0–0.5)
Eosinophils Relative: 2 %
HCT: 33 % — ABNORMAL LOW (ref 39.0–52.0)
Hemoglobin: 10.4 g/dL — ABNORMAL LOW (ref 13.0–17.0)
Immature Granulocytes: 0 %
Lymphocytes Relative: 10 %
Lymphs Abs: 0.8 10*3/uL (ref 0.7–4.0)
MCH: 26.5 pg (ref 26.0–34.0)
MCHC: 31.5 g/dL (ref 30.0–36.0)
MCV: 84 fL (ref 80.0–100.0)
Monocytes Absolute: 0.6 10*3/uL (ref 0.1–1.0)
Monocytes Relative: 8 %
Neutro Abs: 5.8 10*3/uL (ref 1.7–7.7)
Neutrophils Relative %: 80 %
Platelets: 165 10*3/uL (ref 150–400)
RBC: 3.93 MIL/uL — ABNORMAL LOW (ref 4.22–5.81)
RDW: 14.6 % (ref 11.5–15.5)
WBC: 7.4 10*3/uL (ref 4.0–10.5)
nRBC: 0 % (ref 0.0–0.2)

## 2023-07-01 LAB — BASIC METABOLIC PANEL
Anion gap: 16 — ABNORMAL HIGH (ref 5–15)
BUN: 109 mg/dL — ABNORMAL HIGH (ref 8–23)
CO2: 26 mmol/L (ref 22–32)
Calcium: 7.9 mg/dL — ABNORMAL LOW (ref 8.9–10.3)
Chloride: 85 mmol/L — ABNORMAL LOW (ref 98–111)
Creatinine, Ser: 6.18 mg/dL — ABNORMAL HIGH (ref 0.61–1.24)
GFR, Estimated: 10 mL/min — ABNORMAL LOW (ref >60)
Glucose, Bld: 450 mg/dL — ABNORMAL HIGH (ref 70–99)
Potassium: 3.6 mmol/L (ref 3.5–5.1)
Sodium: 127 mmol/L — ABNORMAL LOW (ref 135–145)

## 2023-07-01 NOTE — ED Notes (Signed)
Dialysis stated that they would be down for the Pt around 1230.

## 2023-07-01 NOTE — Progress Notes (Signed)
   07/01/23 1703  Vitals  Temp 97.8 F (36.6 C)  Pulse Rate 94  Resp 16  BP (!) 146/62  SpO2 96 %  O2 Device Nasal Cannula  Oxygen Therapy  O2 Flow Rate (L/min) 3 L/min  Patient Activity (if Appropriate) In bed  Pulse Oximetry Type Continuous  Post Treatment  Dialyzer Clearance Clear  Duration of HD Treatment -hour(s) 3.21 hour(s)  Hemodialysis Intake (mL) 0 mL  Liters Processed 70  Fluid Removed (mL) 2900 mL  Tolerated HD Treatment Yes  Post-Hemodialysis Comments Pt. was upset, D/T want to come off machine at a select time. VSS and Report called to Issac RN.  AVG/AVF Arterial Site Held (minutes) 5 minutes  AVG/AVF Venous Site Held (minutes) 5 minutes   Received patient in bed to unit.  Alert and oriented.  Informed consent signed and in chart.   TX duration:3:21  Patient tolerated well.  Transported back to the room  Alert, without acute distress.  Hand-off given to patient's nurse.   Access used: Yes Access issues: No  Total UF removed: 2900 Medication(s) given: See MAR Post HD VS: See Above Grid Post HD weight: No   [facility]

## 2023-07-01 NOTE — ED Provider Notes (Signed)
[place] EMERGENCY DEPARTMENT AT [facility] Provider Note   CSN: [id number] Arrival date & time: 07/01/23  6301     History No chief complaint on file.   Jeffrey [name] is a 62 y.o. male with h/o ESRD on hemodialysis, CHF, COPD, diabetes mellitus, GERD, hypertension, MI, OSA,, hyperlipidemia, chronic respiratory failure on 3 L nasal cannula at baseline presents emerged from today for dialysis.  Patient currently does not have any complaint.  Denies any chest pain, shortness of breath, cough, or worsening extremity swelling.  He did leave AGAINST MEDICAL ADVICE on Thursday before receiving his dialysis session however still does not have any complaints.  HPI     Home Medications Prior to Admission medications   Medication Sig Start Date End Date Taking? Authorizing Provider  amLODipine (NORVASC) 10 MG tablet Take 10 mg by mouth every morning. 04/17/20   [provider]  ASPIRIN LOW DOSE 81 MG EC tablet Take 81 mg by mouth in the morning. 02/22/21   [provider]  atorvastatin (LIPITOR) 40 MG tablet Take 40 mg by mouth in the morning. 02/09/21   [provider]  calcium acetate (PHOSLO) 667 MG capsule Take 2 capsules (1,334 mg total) by mouth with breakfast, with lunch, and with evening meal. 04/10/21   Calvert Cantor, MD  Calcium Carbonate Antacid (CALCIUM CARBONATE, DOSED IN MG ELEMENTAL CALCIUM,) 1250 MG/5ML SUSP Take 5 mLs (500 mg of elemental calcium total) by mouth every 6 (six) hours as needed for indigestion. 04/08/23   Rhetta Mura, MD  carvedilol (COREG) 12.5 MG tablet Take 12.5 mg by mouth 2 (two) times daily with a meal. 04/17/20   [provider]  Cholecalciferol (VITAMIN D) 50 MCG (2000 UT) CAPS Take 2,000 Units by mouth daily.    [provider]  clopidogrel (PLAVIX) 75 MG tablet Take 1 tablet (75 mg total) by mouth daily. 11/02/22   Rhyne, Ames Coupe, PA-C  cyclobenzaprine (FLEXERIL) 10 MG tablet Take 10 mg by mouth  3 (three) times daily as needed for muscle spasms. 12/29/21   [provider]  diazepam (VALIUM) 5 MG tablet Take 5 mg by mouth 2 (two) times daily as needed for muscle spasms. 12/09/21   [provider]  insulin regular human CONCENTRATED (HUMULIN R U-500 KWIKPEN) 500 UNIT/ML KwikPen Inject 45 Units into the skin daily with supper. Patient taking differently: Inject 70-125 Units into the skin 2 (two) times daily with a meal. 09/04/21 08/30/24  [name], MD  metolazone (ZAROXOLYN) 10 MG tablet Take 10 mg by mouth daily. 07/13/21   [provider]  nortriptyline (PAMELOR) 10 MG capsule Take 20 mg by mouth at bedtime. 10/23/19 08/30/24  [provider]  pantoprazole (PROTONIX) 40 MG tablet Take 40 mg by mouth daily before breakfast. 02/09/21   [provider]  pregabalin (LYRICA) 75 MG capsule Take 1 capsule (75 mg total) by mouth See admin instructions. Daily.  Give after dialysis on dialysis days. 01/21/23   [name], MD  sertraline (ZOLOFT) 100 MG tablet Take 100 mg by mouth in the morning. 02/22/21   [provider]  tamsulosin (FLOMAX) 0.4 MG CAPS capsule Take 1 capsule (0.4 mg total) by mouth daily. 11/10/21   [name], MD  torsemide (DEMADEX) 20 MG tablet Take 120 mg by mouth 2 (two) times daily.    [provider]  traZODone (DESYREL) 150 MG tablet Take 150 mg by mouth at bedtime. 12/09/21   [provider]  Allergies    Actos [pioglitazone], Dexmedetomidine, Ibuprofen, Tomato, and Wellbutrin [bupropion]    Review of Systems   Review of Systems  Constitutional:  Negative for chills and fever.  Respiratory:  Negative for cough and shortness of breath.   Cardiovascular:  Positive for leg swelling (chronic). Negative for chest pain.  Gastrointestinal:  Negative for abdominal pain, diarrhea, nausea and vomiting.    Physical Exam Updated Vital Signs There were no vitals taken for this visit. Physical  Exam Vitals and nursing note reviewed.  Constitutional:      General: He is not in acute distress.    Appearance: He is not toxic-appearing.  Eyes:     General: No scleral icterus. Cardiovascular:     Rate and Rhythm: Normal rate.  Pulmonary:     Effort: Pulmonary effort is normal. No respiratory distress.     Comments: On his chronic nasal cannula.  Speaking in full sentences.  No acute distress. Musculoskeletal:     Comments: Left AKA, chronic edema is seen to the right lower leg.  Patient reports unchanged.  Compartments firm but pliable.  Neurological:     Mental Status: He is alert.     ED Results / Procedures / Treatments   Labs (all labs ordered are listed, but only abnormal results are displayed) Labs Reviewed - No data to display  EKG None  Radiology No results found.  Procedures Procedures   Medications Ordered in ED Medications - No data to display  ED Course/ Medical Decision Making/ A&P    Medical Decision Making  62 y.o. male presents to the ER today requesting dialysis.  I will differential diagnosis includes but is not limited to being dialysis, electrolyte abnormalities. Vital signs show hypertension at 163/81 otherwise unremarkable. Physical exam as noted above.   He did leave AMA on Thursday as he reports that he was waiting too long for dialysis.  Currently, he does not have any complaints.  He is refusing lab work done he reports he will only allow the dialysis nurses to stick him.  Given that his vital signs only show mildly elevated blood pressure.  He does not appear in any acute distress.  Reports that his edema is at its chronic level. Patient is well known to the department for receiving dialysis here.  Patient for dialysis. Likely can be discharged afterwards.   Portions of this report may have been transcribed using voice recognition software. Every effort was made to ensure accuracy; however, inadvertent computerized transcription errors  may be present.   Final Clinical Impression(s) / ED Diagnoses Final diagnoses:  ESRD needing dialysis [facility])    Rx / DC Orders ED Discharge Orders     None         [name], PA-C 07/01/23 1513    Lonell Grandchild, MD 07/02/23 1032

## 2023-07-01 NOTE — ED Provider Notes (Signed)
Pt did get his dialysis today.  He left prior to my seeing him, but nursing reports that he felt ok.    Jacalyn Lefevre, MD 07/01/23 [phone]

## 2023-07-04 ENCOUNTER — Emergency Department (HOSPITAL_COMMUNITY)
Admission: EM | Admit: 2023-07-04 | Discharge: 2023-07-04 | Discharge status: Against Medical Advice | Payer: 59 | Attending: Emergency Medicine | Admitting: Emergency Medicine

## 2023-07-04 ENCOUNTER — Encounter (HOSPITAL_COMMUNITY): Payer: Self-pay

## 2023-07-04 ENCOUNTER — Other Ambulatory Visit: Payer: Self-pay

## 2023-07-04 DIAGNOSIS — Z5321 Procedure and treatment not carried out due to patient leaving prior to being seen by health care provider: Secondary | ICD-10-CM | POA: Insufficient documentation

## 2023-07-04 DIAGNOSIS — Z794 Long term (current) use of insulin: Secondary | ICD-10-CM | POA: Diagnosis not present

## 2023-07-04 DIAGNOSIS — Z7901 Long term (current) use of anticoagulants: Secondary | ICD-10-CM | POA: Insufficient documentation

## 2023-07-04 DIAGNOSIS — Z7982 Long term (current) use of aspirin: Secondary | ICD-10-CM | POA: Diagnosis not present

## 2023-07-04 DIAGNOSIS — Z79899 Other long term (current) drug therapy: Secondary | ICD-10-CM | POA: Insufficient documentation

## 2023-07-04 DIAGNOSIS — Z992 Dependence on renal dialysis: Secondary | ICD-10-CM | POA: Diagnosis not present

## 2023-07-04 DIAGNOSIS — E1122 Type 2 diabetes mellitus with diabetic chronic kidney disease: Secondary | ICD-10-CM | POA: Insufficient documentation

## 2023-07-04 DIAGNOSIS — N186 End stage renal disease: Secondary | ICD-10-CM | POA: Insufficient documentation

## 2023-07-04 DIAGNOSIS — J449 Chronic obstructive pulmonary disease, unspecified: Secondary | ICD-10-CM | POA: Diagnosis not present

## 2023-07-04 DIAGNOSIS — I509 Heart failure, unspecified: Secondary | ICD-10-CM | POA: Diagnosis not present

## 2023-07-04 DIAGNOSIS — I132 Hypertensive heart and chronic kidney disease with heart failure and with stage 5 chronic kidney disease, or end stage renal disease: Secondary | ICD-10-CM | POA: Diagnosis not present

## 2023-07-04 LAB — HEPATITIS B SURFACE ANTIBODY, QUANTITATIVE: Hep B S AB Quant (Post): 33.3 m[IU]/mL

## 2023-07-04 MED ORDER — CHLORHEXIDINE GLUCONATE CLOTH 2 % EX PADS: 6.0000 | MEDICATED_PAD | Freq: Every day | CUTANEOUS | Status: DC

## 2023-07-04 NOTE — Progress Notes (Signed)
Received patient in bed to unit.  Alert and oriented.  Informed consent signed and in chart.   TX duration: 0 hours  Patient tolerated well.  Transported back to the room  Alert, without acute distress.  Hand-off given to patient's nurse.   Access used: LUA Graft Access issues: Could not access, patient declined to be stuck again and declining dialysis today Nephrologist informed.    Stacie Glaze LPN [facility]

## 2023-07-04 NOTE — ED Provider Notes (Signed)
[place] EMERGENCY DEPARTMENT AT [facility] Provider Note   CSN: [id number] Arrival date & time: 07/04/23  4098     History  Chief Complaint  Patient presents with   Needs Dialysis    Jeffrey [name] is a 62 y.o. male with h/o ESRD on hemodialysis, CHF, COPD, diabetes mellitus, GERD, hypertension, MI, OSA, hyperlipidemia, chronic respiratory failure on 3 L nasal cannula at baseline presents emerged from today for dialysis.  Patient currently does not have any complaint.  Denies any chest pain, shortness of breath, cough, or worsening extremity swelling.  Feels in his NSOH. He did leave from the ED prior to being reevaluated after dialysis on Saturday however still does not have any complaints.   Past Medical History:  Diagnosis Date   Altered mental status 05/04/2021   Arthritis    Blood transfusion without reported diagnosis    CHF (congestive heart failure) (HCC)    COPD (chronic obstructive pulmonary disease) (HCC)    Depression    Diabetes mellitus without complication (HCC)    type 2   Dyspnea    ESRD on hemodialysis (HCC)    Tues Thurs Sat   GERD (gastroesophageal reflux disease)    protonix   Heart murmur    as a child, no problems   Hypertension    Myocardial infarction (HCC)    PTSD (post-traumatic stress disorder)    Sepsis (HCC)    Sleep apnea    occasional uses CPAP   Uses powered wheelchair        Home Medications Prior to Admission medications   Medication Sig Start Date End Date Taking? Authorizing Provider  amLODipine (NORVASC) 10 MG tablet Take 10 mg by mouth every morning. 04/17/20   [provider]  ASPIRIN LOW DOSE 81 MG EC tablet Take 81 mg by mouth in the morning. 02/22/21   [provider]  atorvastatin (LIPITOR) 40 MG tablet Take 40 mg by mouth in the morning. 02/09/21   [provider]  calcium acetate (PHOSLO) 667 MG capsule Take 2 capsules (1,334 mg total) by mouth with breakfast, with lunch, and with  evening meal. 04/10/21   Calvert Cantor, MD  Calcium Carbonate Antacid (CALCIUM CARBONATE, DOSED IN MG ELEMENTAL CALCIUM,) 1250 MG/5ML SUSP Take 5 mLs (500 mg of elemental calcium total) by mouth every 6 (six) hours as needed for indigestion. 04/08/23   Rhetta Mura, MD  carvedilol (COREG) 12.5 MG tablet Take 12.5 mg by mouth 2 (two) times daily with a meal. 04/17/20   [provider]  Cholecalciferol (VITAMIN D) 50 MCG (2000 UT) CAPS Take 2,000 Units by mouth daily.    [provider]  clopidogrel (PLAVIX) 75 MG tablet Take 1 tablet (75 mg total) by mouth daily. 11/02/22   Rhyne, Ames Coupe, PA-C  cyclobenzaprine (FLEXERIL) 10 MG tablet Take 10 mg by mouth 3 (three) times daily as needed for muscle spasms. 12/29/21   [provider]  diazepam (VALIUM) 5 MG tablet Take 5 mg by mouth 2 (two) times daily as needed for muscle spasms. 12/09/21   [provider]  insulin regular human CONCENTRATED (HUMULIN R U-500 KWIKPEN) 500 UNIT/ML KwikPen Inject 45 Units into the skin daily with supper. Patient taking differently: Inject 70-125 Units into the skin 2 (two) times daily with a meal. 09/04/21 08/30/24  [name], MD  metolazone (ZAROXOLYN) 10 MG tablet Take 10 mg by mouth daily. 07/13/21   [provider]  nortriptyline (PAMELOR) 10 MG capsule  Take 20 mg by mouth at bedtime. 10/23/19 08/30/24  [provider]  pantoprazole (PROTONIX) 40 MG tablet Take 40 mg by mouth daily before breakfast. 02/09/21   [provider]  pregabalin (LYRICA) 75 MG capsule Take 1 capsule (75 mg total) by mouth See admin instructions. Daily.  Give after dialysis on dialysis days. 01/21/23   [name], MD  sertraline (ZOLOFT) 100 MG tablet Take 100 mg by mouth in the morning. 02/22/21   [provider]  tamsulosin (FLOMAX) 0.4 MG CAPS capsule Take 1 capsule (0.4 mg total) by mouth daily. 11/10/21   [name], MD  torsemide (DEMADEX) 20 MG tablet Take  120 mg by mouth 2 (two) times daily.    [provider]  traZODone (DESYREL) 150 MG tablet Take 150 mg by mouth at bedtime. 12/09/21   [provider]      Allergies    Actos [pioglitazone], Dexmedetomidine, Ibuprofen, Tomato, and Wellbutrin [bupropion]    Review of Systems   Review of Systems A 10 point review of systems was performed and is negative unless otherwise reported in HPI.  Physical Exam Updated Vital Signs BP (!) 135/57   Pulse 78   Temp 97.8 F (36.6 C) (Oral)   Resp 17   Ht 5\' 5"  (1.651 m)   Wt (!) 136.5 kg   SpO2 92%   BMI 50.09 kg/m  Physical Exam General: Normal appearing male, sitting in wheelchair. HEENT: Sclera anicteric, MMM, trachea midline.  Cardiology: RRR, no murmurs/rubs/gallops. BL radial and DP pulses equal bilaterally.  Resp: On chronic [place]. Normal respiratory rate and effort. CTAB, no wheezes, rhonchi, crackles.  Abd: Soft, non-tender, non-distended. No rebound tenderness or guarding.  GU: Deferred. MSK: Left AKA, chronic edema to right lower leg.  Patient reports unchanged.  Compartments soft. Skin: warm, dry Neuro: A&Ox4, CNs II-XII grossly intact. MAEs. Sensation grossly intact.  Psych: Normal mood and affect.   ED Results / Procedures / Treatments   Labs (all labs ordered are listed, but only abnormal results are displayed) Labs Reviewed  CBC WITH DIFFERENTIAL/PLATELET  BASIC METABOLIC PANEL  MAGNESIUM    EKG None  Radiology No results found.  Procedures Procedures    Medications Ordered in ED Medications  Chlorhexidine Gluconate Cloth 2 % PADS 6 each (has no administration in time range)    ED Course/ Medical Decision Making/ A&P                          Medical Decision Making Amount and/or Complexity of Data Reviewed Labs: ordered.   MDM:    Presents d/t need for dialysis today. Receives his dialysis from the ED. Did receive full session on Saturday. Has no complaints. Will get labs to eval for  electrolyte abnormalities and Dr. Arlean Hopping from nephrology is notified ASAP who places orders for dialysis today.  Unfortunately, I was notified that while awaiting his dialysis today patient eloped from the ED.      Labs: I Ordered labs including CBC, BMP  Additional history obtained from chart review.  Social Determinants of Health: Lives independently  Disposition:  Eloped from ED  Co morbidities that complicate the patient evaluation  Past Medical History:  Diagnosis Date   Altered mental status 05/04/2021   Arthritis    Blood transfusion without reported diagnosis    CHF (congestive heart failure) (HCC)    COPD (chronic obstructive pulmonary disease) (HCC)    Depression    Diabetes mellitus  without complication (HCC)    type 2   Dyspnea    ESRD on hemodialysis (HCC)    Tues Thurs Sat   GERD (gastroesophageal reflux disease)    protonix   Heart murmur    as a child, no problems   Hypertension    Myocardial infarction (HCC)    PTSD (post-traumatic stress disorder)    Sepsis (HCC)    Sleep apnea    occasional uses CPAP   Uses powered wheelchair      Medicines Meds ordered this encounter  Medications   Chlorhexidine Gluconate Cloth 2 % PADS 6 each    I have reviewed the patients home medicines and have made adjustments as needed  Problem List / ED Course: Problem List Items Addressed This Visit       Genitourinary   ESRD on dialysis [facility]) - Primary   Other Visit Diagnoses     Eloped from emergency department                       This note was created using dictation software, which may contain spelling or grammatical errors.    Loetta Rough, MD 07/04/23 [phone]

## 2023-07-04 NOTE — ED Triage Notes (Signed)
Here for his routine dialysis.  No complaints at this time.

## 2023-07-05 ENCOUNTER — Telehealth: Payer: Self-pay

## 2023-07-05 NOTE — Telephone Encounter (Signed)
Pt called stating that at HD yesterday, his access was stuck twice and there were clots.  Called [facility] HD to get more information. Charge RN was not there yesterday, but she spoke to the RN that was and she was unaware that there was a problem. Instructed them to call if there was a problem with his scheduled treatment tomorrow. Confirmed understanding.

## 2023-07-06 ENCOUNTER — Emergency Department (HOSPITAL_COMMUNITY)
Admission: EM | Admit: 2023-07-06 | Discharge: 2023-07-06 | Disposition: A | Discharge status: Home / Self Care | Payer: 59 | Attending: Emergency Medicine | Admitting: Emergency Medicine

## 2023-07-06 ENCOUNTER — Encounter (HOSPITAL_COMMUNITY): Payer: Self-pay

## 2023-07-06 DIAGNOSIS — Z7982 Long term (current) use of aspirin: Secondary | ICD-10-CM | POA: Diagnosis not present

## 2023-07-06 DIAGNOSIS — N186 End stage renal disease: Secondary | ICD-10-CM | POA: Diagnosis not present

## 2023-07-06 DIAGNOSIS — Z794 Long term (current) use of insulin: Secondary | ICD-10-CM | POA: Diagnosis not present

## 2023-07-06 DIAGNOSIS — Z992 Dependence on renal dialysis: Secondary | ICD-10-CM | POA: Diagnosis not present

## 2023-07-06 LAB — RENAL FUNCTION PANEL
Albumin: 3.5 g/dL (ref 3.5–5.0)
Anion gap: 17 — ABNORMAL HIGH (ref 5–15)
BUN: 118 mg/dL — ABNORMAL HIGH (ref 8–23)
CO2: 27 mmol/L (ref 22–32)
Calcium: 7.8 mg/dL — ABNORMAL LOW (ref 8.9–10.3)
Chloride: 86 mmol/L — ABNORMAL LOW (ref 98–111)
Creatinine, Ser: 6.35 mg/dL — ABNORMAL HIGH (ref 0.61–1.24)
GFR, Estimated: 9 mL/min — ABNORMAL LOW (ref >60)
Glucose, Bld: 361 mg/dL — ABNORMAL HIGH (ref 70–99)
Phosphorus: 4.2 mg/dL (ref 2.5–4.6)
Potassium: 3.3 mmol/L — ABNORMAL LOW (ref 3.5–5.1)
Sodium: 130 mmol/L — ABNORMAL LOW (ref 135–145)

## 2023-07-06 LAB — CBC
HCT: 31.1 % — ABNORMAL LOW (ref 39.0–52.0)
Hemoglobin: 9.8 g/dL — ABNORMAL LOW (ref 13.0–17.0)
MCH: 26.1 pg (ref 26.0–34.0)
MCHC: 31.5 g/dL (ref 30.0–36.0)
MCV: 82.7 fL (ref 80.0–100.0)
Platelets: 126 10*3/uL — ABNORMAL LOW (ref 150–400)
RBC: 3.76 MIL/uL — ABNORMAL LOW (ref 4.22–5.81)
RDW: 14.8 % (ref 11.5–15.5)
WBC: 7.2 10*3/uL (ref 4.0–10.5)
nRBC: 0 % (ref 0.0–0.2)

## 2023-07-06 MED ORDER — LIDOCAINE-PRILOCAINE 2.5-2.5 % EX CREA: 1.0000 | TOPICAL_CREAM | CUTANEOUS | Status: DC | PRN

## 2023-07-06 MED ORDER — CHLORHEXIDINE GLUCONATE CLOTH 2 % EX PADS: 6.0000 | MEDICATED_PAD | Freq: Every day | CUTANEOUS | Status: DC

## 2023-07-06 MED ORDER — PENTAFLUOROPROP-TETRAFLUOROETH EX AERO: 1.0000 | INHALATION_SPRAY | CUTANEOUS | Status: DC | PRN

## 2023-07-06 MED ORDER — LIDOCAINE HCL (PF) 1 % IJ SOLN: 5.0000 mL | INTRAMUSCULAR | Status: DC | PRN

## 2023-07-06 MED ORDER — HEPARIN SODIUM (PORCINE) 1000 UNIT/ML DIALYSIS
2000.0000 [IU] | Freq: Once | INTRAMUSCULAR | Status: AC
  Administered 2023-07-06: 2000 [IU] via INTRAVENOUS_CENTRAL
  Filled 2023-07-06: qty 2

## 2023-07-06 NOTE — ED Provider Notes (Signed)
Pt did complete dialysis today and feels better.  He is ready to go home.  He is stable for d/c.   Jacalyn Lefevre, MD 07/06/23 [phone]

## 2023-07-06 NOTE — ED Triage Notes (Signed)
Pt here for routine dialysis; pt has no complaints or concerns at this time

## 2023-07-06 NOTE — ED Notes (Signed)
Pt transferred to dialysis.

## 2023-07-06 NOTE — Progress Notes (Signed)
POST HD TX NOTE  07/06/23 1617  Vitals  Temp (!) 97.4 F (36.3 C)  Temp Source Oral  BP (!) 150/63  MAP (mmHg) 89  BP Location Right Wrist  BP Method Automatic  Patient Position (if appropriate) Lying  Pulse Rate 82  Pulse Rate Source Monitor  ECG Heart Rate 84  Resp 18  Oxygen Therapy  SpO2 100 %  O2 Device Nasal Cannula  O2 Flow Rate (L/min) 3 L/min  Pulse Oximetry Type Continuous  During Treatment Monitoring  Intra-Hemodialysis Comments (S)   (post HD tx VS c heck)  Post Treatment  Dialyzer Clearance Lightly streaked  Duration of HD Treatment -hour(s) 2.15 hour(s) (2hr 5 min)  Hemodialysis Intake (mL) 0 mL  Liters Processed 50.1  Fluid Removed (mL) 1000 mL  Tolerated HD Treatment Yes  Post-Hemodialysis Comments (S)  tx ended 1hr 25 min early so that pt can catch his ride, UF goal not met, blood rinsed back, VSS. Medication Admin: none  AVG/AVF Arterial Site Held (minutes) 6 minutes  AVG/AVF Venous Site Held (minutes) 6 minutes  Fistula / Graft Left Upper arm Arteriovenous vein graft  Placement Date/Time: (c) 04/22/23 (c) 1000   Orientation: Left  Access Location: Upper arm  Access Type: Arteriovenous vein graft  Site Condition No complications  Fistula / Graft Assessment Bruit;Thrill;Present  Drainage Description None

## 2023-07-06 NOTE — ED Provider Notes (Signed)
[place] EMERGENCY DEPARTMENT AT [facility] Provider Note   CSN: [id number] Arrival date & time: 07/06/23  4098     History  No chief complaint on file.   Jeffrey [name] is a 62 y.o. male on chronic home oxygen presenting to ED requesting dialysis.  The patient had, to the ED 2 days ago but he says they were not able to start dialysis due to problems with his fistula and he opted to leave instead.  He returns this morning requesting dialysis.  Denies any shortness of breath.  His last session would have been 4 days ago.  HPI     Home Medications Prior to Admission medications   Medication Sig Start Date End Date Taking? Authorizing Provider  amLODipine (NORVASC) 10 MG tablet Take 10 mg by mouth every morning. 04/17/20   [provider]  ASPIRIN LOW DOSE 81 MG EC tablet Take 81 mg by mouth in the morning. 02/22/21   [provider]  atorvastatin (LIPITOR) 40 MG tablet Take 40 mg by mouth in the morning. 02/09/21   [provider]  calcium acetate (PHOSLO) 667 MG capsule Take 2 capsules (1,334 mg total) by mouth with breakfast, with lunch, and with evening meal. 04/10/21   Calvert Cantor, MD  Calcium Carbonate Antacid (CALCIUM CARBONATE, DOSED IN MG ELEMENTAL CALCIUM,) 1250 MG/5ML SUSP Take 5 mLs (500 mg of elemental calcium total) by mouth every 6 (six) hours as needed for indigestion. 04/08/23   Rhetta Mura, MD  carvedilol (COREG) 12.5 MG tablet Take 12.5 mg by mouth 2 (two) times daily with a meal. 04/17/20   [provider]  Cholecalciferol (VITAMIN D) 50 MCG (2000 UT) CAPS Take 2,000 Units by mouth daily.    [provider]  clopidogrel (PLAVIX) 75 MG tablet Take 1 tablet (75 mg total) by mouth daily. 11/02/22   Rhyne, Ames Coupe, PA-C  cyclobenzaprine (FLEXERIL) 10 MG tablet Take 10 mg by mouth 3 (three) times daily as needed for muscle spasms. 12/29/21   [provider]  diazepam (VALIUM) 5 MG tablet Take 5 mg by  mouth 2 (two) times daily as needed for muscle spasms. 12/09/21   [provider]  insulin regular human CONCENTRATED (HUMULIN R U-500 KWIKPEN) 500 UNIT/ML KwikPen Inject 45 Units into the skin daily with supper. Patient taking differently: Inject 70-125 Units into the skin 2 (two) times daily with a meal. 09/04/21 08/30/24  [name], MD  metolazone (ZAROXOLYN) 10 MG tablet Take 10 mg by mouth daily. 07/13/21   [provider]  nortriptyline (PAMELOR) 10 MG capsule Take 20 mg by mouth at bedtime. 10/23/19 08/30/24  [provider]  pantoprazole (PROTONIX) 40 MG tablet Take 40 mg by mouth daily before breakfast. 02/09/21   [provider]  pregabalin (LYRICA) 75 MG capsule Take 1 capsule (75 mg total) by mouth See admin instructions. Daily.  Give after dialysis on dialysis days. 01/21/23   [name], MD  sertraline (ZOLOFT) 100 MG tablet Take 100 mg by mouth in the morning. 02/22/21   [provider]  tamsulosin (FLOMAX) 0.4 MG CAPS capsule Take 1 capsule (0.4 mg total) by mouth daily. 11/10/21   [name], MD  torsemide (DEMADEX) 20 MG tablet Take 120 mg by mouth 2 (two) times daily.    [provider]  traZODone (DESYREL) 150 MG tablet Take 150 mg by mouth at bedtime. 12/09/21   [provider]      Allergies  Actos [pioglitazone], Dexmedetomidine, Ibuprofen, Tomato, and Wellbutrin [bupropion]    Review of Systems   Review of Systems  Physical Exam Updated Vital Signs BP (!) 160/70   Pulse 88   Temp 97.8 F (36.6 C)   Resp 18   SpO2 100%  Physical Exam Constitutional:      General: He is not in acute distress. HENT:     Head: Normocephalic and atraumatic.  Eyes:     Conjunctiva/sclera: Conjunctivae normal.     Pupils: Pupils are equal, round, and reactive to light.  Cardiovascular:     Rate and Rhythm: Normal rate and regular rhythm.  Pulmonary:     Effort: Pulmonary effort is normal. No respiratory  distress.     Comments: On home O2 Abdominal:     General: There is no distension.     Tenderness: There is no abdominal tenderness.  Skin:    General: Skin is warm and dry.     Comments: Fistula left arm  Neurological:     General: No focal deficit present.     Mental Status: He is alert. Mental status is at baseline.  Psychiatric:        Mood and Affect: Mood normal.        Behavior: Behavior normal.     ED Results / Procedures / Treatments   Labs (all labs ordered are listed, but only abnormal results are displayed) Labs Reviewed  CBC  RENAL FUNCTION PANEL    EKG None  Radiology No results found.  Procedures Procedures    Medications Ordered in ED Medications  Chlorhexidine Gluconate Cloth 2 % PADS 6 each (has no administration in time range)  pentafluoroprop-tetrafluoroeth (GEBAUERS) aerosol 1 Application (has no administration in time range)  lidocaine (PF) (XYLOCAINE) 1 % injection 5 mL (has no administration in time range)  lidocaine-prilocaine (EMLA) cream 1 Application (has no administration in time range)  heparin injection 2,000 Units (has no administration in time range)    ED Course/ Medical Decision Making/ A&P Clinical Course as of 07/06/23 1029  Thu Jul 06, 2023  0809 I spoke to Dr Arlean Hopping from nephrology who will arrange for dialysis - he is aware of difficulty with fistula access on last attempt and states they will troubleshoot this at dialysis [MT]    Clinical Course User Index [MT] , Kermit Balo, MD                                 Medical Decision Making  Patient is here for routine dialysis.  He is on his baseline oxygen, no new hypoxia or respiratory distress.  He is otherwise asymptomatic.  There is no indication for emergent lab work in the ED at this time.  I have spoken to the nephrologist, see ED course, he will plan for dialysis today.  The nephrologist is aware of the difficulty they had with a fistula on prior attempts.  The  nephrologist did not recommend any emergent vascular imaging in the ED for the fistula.  The patient could likely be discharged following dialysis.        Final Clinical Impression(s) / ED Diagnoses Final diagnoses:  ESRD needing dialysis [facility])    Rx / DC Orders ED Discharge Orders     None         Terald Sleeper, MD 07/06/23 1029

## 2023-07-06 NOTE — ED Notes (Signed)
Estimated time to Dialysis for Jeffrey [name] is around 1300 per Tory-dialysis RN

## 2023-07-08 ENCOUNTER — Emergency Department (HOSPITAL_COMMUNITY)
Admission: EM | Admit: 2023-07-08 | Discharge: 2023-07-08 | Disposition: A | Discharge status: Home / Self Care | Payer: 59 | Source: Home / Self Care | Attending: Emergency Medicine | Admitting: Emergency Medicine

## 2023-07-08 ENCOUNTER — Other Ambulatory Visit: Payer: Self-pay

## 2023-07-08 ENCOUNTER — Encounter (HOSPITAL_COMMUNITY): Payer: Self-pay

## 2023-07-08 DIAGNOSIS — Z992 Dependence on renal dialysis: Secondary | ICD-10-CM | POA: Diagnosis not present

## 2023-07-08 DIAGNOSIS — N186 End stage renal disease: Secondary | ICD-10-CM | POA: Insufficient documentation

## 2023-07-08 DIAGNOSIS — J449 Chronic obstructive pulmonary disease, unspecified: Secondary | ICD-10-CM | POA: Diagnosis not present

## 2023-07-08 DIAGNOSIS — E1122 Type 2 diabetes mellitus with diabetic chronic kidney disease: Secondary | ICD-10-CM | POA: Insufficient documentation

## 2023-07-08 DIAGNOSIS — I509 Heart failure, unspecified: Secondary | ICD-10-CM | POA: Diagnosis not present

## 2023-07-08 DIAGNOSIS — Z7982 Long term (current) use of aspirin: Secondary | ICD-10-CM | POA: Insufficient documentation

## 2023-07-08 DIAGNOSIS — Z79899 Other long term (current) drug therapy: Secondary | ICD-10-CM | POA: Insufficient documentation

## 2023-07-08 DIAGNOSIS — Z794 Long term (current) use of insulin: Secondary | ICD-10-CM | POA: Diagnosis not present

## 2023-07-08 DIAGNOSIS — I132 Hypertensive heart and chronic kidney disease with heart failure and with stage 5 chronic kidney disease, or end stage renal disease: Secondary | ICD-10-CM | POA: Insufficient documentation

## 2023-07-08 LAB — BASIC METABOLIC PANEL
Anion gap: 16 — ABNORMAL HIGH (ref 5–15)
BUN: 96 mg/dL — ABNORMAL HIGH (ref 8–23)
CO2: 27 mmol/L (ref 22–32)
Calcium: 8 mg/dL — ABNORMAL LOW (ref 8.9–10.3)
Chloride: 94 mmol/L — ABNORMAL LOW (ref 98–111)
Creatinine, Ser: 5.68 mg/dL — ABNORMAL HIGH (ref 0.61–1.24)
GFR, Estimated: 11 mL/min — ABNORMAL LOW (ref >60)
Glucose, Bld: 92 mg/dL (ref 70–99)
Potassium: 3.5 mmol/L (ref 3.5–5.1)
Sodium: 137 mmol/L (ref 135–145)

## 2023-07-08 LAB — IRON AND TIBC
Iron: 59 ug/dL (ref 45–182)
Saturation Ratios: 24 % (ref 17.9–39.5)
TIBC: 244 ug/dL — ABNORMAL LOW (ref 250–450)
UIBC: 185 ug/dL

## 2023-07-08 LAB — CBC WITH DIFFERENTIAL/PLATELET
Abs Immature Granulocytes: 0.07 10*3/uL (ref 0.00–0.07)
Basophils Absolute: 0 10*3/uL (ref 0.0–0.1)
Basophils Relative: 0 %
Eosinophils Absolute: 0.2 10*3/uL (ref 0.0–0.5)
Eosinophils Relative: 2 %
HCT: 32.2 % — ABNORMAL LOW (ref 39.0–52.0)
Hemoglobin: 10.1 g/dL — ABNORMAL LOW (ref 13.0–17.0)
Immature Granulocytes: 1 %
Lymphocytes Relative: 11 %
Lymphs Abs: 0.8 10*3/uL (ref 0.7–4.0)
MCH: 26.8 pg (ref 26.0–34.0)
MCHC: 31.4 g/dL (ref 30.0–36.0)
MCV: 85.4 fL (ref 80.0–100.0)
Monocytes Absolute: 0.5 10*3/uL (ref 0.1–1.0)
Monocytes Relative: 6 %
Neutro Abs: 5.8 10*3/uL (ref 1.7–7.7)
Neutrophils Relative %: 80 %
Platelets: 119 10*3/uL — ABNORMAL LOW (ref 150–400)
RBC: 3.77 MIL/uL — ABNORMAL LOW (ref 4.22–5.81)
RDW: 15 % (ref 11.5–15.5)
WBC: 7.3 10*3/uL (ref 4.0–10.5)
nRBC: 0 % (ref 0.0–0.2)

## 2023-07-08 MED ORDER — DARBEPOETIN ALFA 150 MCG/0.3ML IJ SOSY
150.0000 ug | PREFILLED_SYRINGE | Freq: Once | INTRAMUSCULAR | Status: AC
  Administered 2023-07-08: 150 ug via SUBCUTANEOUS
  Filled 2023-07-08: qty 0.3

## 2023-07-08 MED ORDER — CHLORHEXIDINE GLUCONATE CLOTH 2 % EX PADS: 6.0000 | MEDICATED_PAD | Freq: Every day | CUTANEOUS | Status: DC

## 2023-07-08 MED ORDER — HEPARIN SODIUM (PORCINE) 1000 UNIT/ML DIALYSIS
2000.0000 [IU] | Freq: Once | INTRAMUSCULAR | Status: AC
  Administered 2023-07-08: 2000 [IU] via INTRAVENOUS_CENTRAL
  Filled 2023-07-08: qty 2

## 2023-07-08 NOTE — ED Provider Notes (Signed)
[place] EMERGENCY DEPARTMENT AT [facility] Provider Note   CSN: [id number] Arrival date & time: 07/08/23  2725     History  Chief Complaint  Patient presents with   needs dialysis     Jeffrey [name] is a 62 y.o. male history of ESRD on dialysis Tuesday Thursday Saturday, COPD, diabetes, CHF, hypertension, MI presented for dialysis.  Patient states he does not have any other complaints at this time and is here to get his routine dialysis.  Patient denies any chest pain, shortness of breath, change sensation/motor skills, feeling fluid overloaded, productive cough, fevers, change in sensation/motor skills, skin color changes, confusion or altered mental status.    Home Medications Prior to Admission medications   Medication Sig Start Date End Date Taking? Authorizing Provider  amLODipine (NORVASC) 10 MG tablet Take 10 mg by mouth every morning. 04/17/20   [provider]  ASPIRIN LOW DOSE 81 MG EC tablet Take 81 mg by mouth in the morning. 02/22/21   [provider]  atorvastatin (LIPITOR) 40 MG tablet Take 40 mg by mouth in the morning. 02/09/21   [provider]  calcium acetate (PHOSLO) 667 MG capsule Take 2 capsules (1,334 mg total) by mouth with breakfast, with lunch, and with evening meal. 04/10/21   Calvert Cantor, MD  Calcium Carbonate Antacid (CALCIUM CARBONATE, DOSED IN MG ELEMENTAL CALCIUM,) 1250 MG/5ML SUSP Take 5 mLs (500 mg of elemental calcium total) by mouth every 6 (six) hours as needed for indigestion. 04/08/23   Rhetta Mura, MD  carvedilol (COREG) 12.5 MG tablet Take 12.5 mg by mouth 2 (two) times daily with a meal. 04/17/20   [provider]  Cholecalciferol (VITAMIN D) 50 MCG (2000 UT) CAPS Take 2,000 Units by mouth daily.    [provider]  clopidogrel (PLAVIX) 75 MG tablet Take 1 tablet (75 mg total) by mouth daily. 11/02/22   Rhyne, Ames Coupe, PA-C  cyclobenzaprine (FLEXERIL) 10 MG tablet Take 10 mg by  mouth 3 (three) times daily as needed for muscle spasms. 12/29/21   [provider]  diazepam (VALIUM) 5 MG tablet Take 5 mg by mouth 2 (two) times daily as needed for muscle spasms. 12/09/21   [provider]  insulin regular human CONCENTRATED (HUMULIN R U-500 KWIKPEN) 500 UNIT/ML KwikPen Inject 45 Units into the skin daily with supper. Patient taking differently: Inject 70-125 Units into the skin 2 (two) times daily with a meal. 09/04/21 08/30/24  [name], MD  metolazone (ZAROXOLYN) 10 MG tablet Take 10 mg by mouth daily. 07/13/21   [provider]  nortriptyline (PAMELOR) 10 MG capsule Take 20 mg by mouth at bedtime. 10/23/19 08/30/24  [provider]  pantoprazole (PROTONIX) 40 MG tablet Take 40 mg by mouth daily before breakfast. 02/09/21   [provider]  pregabalin (LYRICA) 75 MG capsule Take 1 capsule (75 mg total) by mouth See admin instructions. Daily.  Give after dialysis on dialysis days. 01/21/23   [name], MD  sertraline (ZOLOFT) 100 MG tablet Take 100 mg by mouth in the morning. 02/22/21   [provider]  tamsulosin (FLOMAX) 0.4 MG CAPS capsule Take 1 capsule (0.4 mg total) by mouth daily. 11/10/21   [name], MD  torsemide (DEMADEX) 20 MG tablet Take 120 mg by mouth 2 (two) times daily.    [provider]  traZODone (DESYREL) 150 MG tablet Take 150 mg by mouth at bedtime. 12/09/21   [provider]  Allergies    Actos [pioglitazone], Dexmedetomidine, Ibuprofen, Tomato, and Wellbutrin [bupropion]    Review of Systems   Review of Systems  Physical Exam Updated Vital Signs BP 136/69 (BP Location: Right Arm)   Pulse 92   Temp 98 F (36.7 C) (Oral)   Resp 17   SpO2 98%  Physical Exam Vitals reviewed.  Constitutional:      General: He is not in acute distress. HENT:     Head: Normocephalic and atraumatic.  Eyes:     Extraocular Movements: Extraocular movements intact.      Conjunctiva/sclera: Conjunctivae normal.     Pupils: Pupils are equal, round, and reactive to light.  Cardiovascular:     Rate and Rhythm: Normal rate and regular rhythm.     Pulses: Normal pulses.     Heart sounds: Normal heart sounds.     Comments: 2+ bilateral radial/dorsalis pedis pulses with regular rate Pulmonary:     Effort: Pulmonary effort is normal. No respiratory distress.     Breath sounds: Normal breath sounds.     Comments: On 3L: baseline Abdominal:     Palpations: Abdomen is soft.     Tenderness: There is no abdominal tenderness. There is no guarding or rebound.  Musculoskeletal:        General: Normal range of motion.     Cervical back: Normal range of motion and neck supple.     Right lower leg: No edema.     Left lower leg: No edema.     Comments: 5 out of 5 bilateral grip/leg extension strength  Skin:    General: Skin is warm and dry.     Capillary Refill: Capillary refill takes less than 2 seconds.  Neurological:     General: No focal deficit present.     Mental Status: He is alert and oriented to person, place, and time.     Comments: Sensation intact in all 4 limbs  Psychiatric:        Mood and Affect: Mood normal.     ED Results / Procedures / Treatments   Labs (all labs ordered are listed, but only abnormal results are displayed) Labs Reviewed  BASIC METABOLIC PANEL  CBC WITH DIFFERENTIAL/PLATELET  IRON AND TIBC    EKG None  Radiology No results found.  Procedures Procedures    Medications Ordered in ED Medications  Darbepoetin Alfa (ARANESP) injection 150 mcg (has no administration in time range)  Chlorhexidine Gluconate Cloth 2 % PADS 6 each (has no administration in time range)  heparin injection 2,000 Units (has no administration in time range)    ED Course/ Medical Decision Making/ A&P                                 Medical Decision Making Amount and/or Complexity of Data Reviewed Labs: ordered.   [name] 62 y.o.  presented today for dialysis. Working DDx that I considered at this time includes, but not limited to, routine dialysis, electrolyte abnormalities, arrhythmias, emergent dialysis, dehydration, fluid overload, pleural effusions, uremic encephalopathy.  R/o DDx: electrolyte abnormalities, arrhythmias, emergent dialysis, dehydration, fluid overload, pleural effusions, uremic encephalopath: These are considered less likely due to history of present illness and physical exam findings  Review of prior external notes: 07/06/2023 ED provider  Unique Tests and My Interpretation:  Patient refused  Discussion with Independent Historian: None  Discussion of Management of Tests:  Arlean Hopping, MD Nephrologist  Risk: Low: based on diagnostic testing/clinical impression and treatment plan  Risk Stratification Score: none  Plan: On exam patient was in no acute distress with stable vitals. Physical exam was unremarkable.  Patient states that he has been compliant with his dialysis and is here for routine dialysis without other concerns.  Patient denies worsening chest pain or shortness of breath or any productive cough or other respiratory symptoms or chest x-ray was not ordered.  Basic labs drawn along with EKG and pending these results nephrology be consulted for dialysis.  Patient reviewed his labs.  Nephrology was consulted and stated that they would come see the patient.  Patient stable at this time for dialysis.  Anticipate discharge after dialysis         Final Clinical Impression(s) / ED Diagnoses Final diagnoses:  ESRD (end stage renal disease) on dialysis [facility])    Rx / DC Orders ED Discharge Orders     None         Finnley, Lamora 07/08/23 0454    Margarita Grizzle, MD 07/09/23 1502

## 2023-07-08 NOTE — Discharge Instructions (Addendum)
Please follow-up with your primary care provider for any symptoms you may have.  If you begin having changes or worsening of symptoms please return to ER.

## 2023-07-08 NOTE — Progress Notes (Signed)
Received patient in bed to unit.  Alert and oriented.  Informed consent signed and in chart.   TX duration:3:15  Patient tolerated well.  Transported back to the room  Alert, without acute distress.  Hand-off given to patient's nurse.   Access used: left AVG Access issues: none  Total UF removed: 1.9L Medication(s) given: aranesp   07/08/23 1340  Vitals  Temp 98.3 F (36.8 C)  Temp Source Oral  BP (!) 147/87  MAP (mmHg) 106  BP Location Right Arm  BP Method Automatic  Patient Position (if appropriate) Lying  Pulse Rate 97  Pulse Rate Source Monitor  ECG Heart Rate 96  Resp 18  Oxygen Therapy  SpO2 100 %  O2 Device Nasal Cannula  O2 Flow Rate (L/min) 3 L/min  During Treatment Monitoring  Blood Flow Rate (mL/min) 299 mL/min  Arterial Pressure (mmHg) -132.72 mmHg  Venous Pressure (mmHg) 311.9 mmHg  TMP (mmHg) 21.61 mmHg  Ultrafiltration Rate (mL/min) 725 mL/min  Dialysate Flow Rate (mL/min) 300 ml/min  HD Safety Checks Performed Yes  Intra-Hemodialysis Comments Tolerated well  Dialysis Fluid Bolus Normal Saline  Bolus Amount (mL) 300 mL  Dialysate Potassium Concentration 3  Dialysate Calcium Concentration 2.5  Post Treatment  Duration of HD Treatment -hour(s) 3.22 hour(s)  Fluid Removed (mL) 1831.67 mL       S  [facility]

## 2023-07-08 NOTE — ED Triage Notes (Signed)
Pt here for routine dialysis.

## 2023-07-08 NOTE — Procedures (Addendum)
We were asked to see this patient for dialysis. Pt was discharged from his OP HD unit in December 2023 due to behavioral issues. He no longer has a home outpatient HD unit. Pt came to ED today requesting hospital dialysis. The plan will be for "ED HD". Patient is not being admitted. Pt will go to the dialysis unit upstairs when they are ready for the pt. Then the pt will get dialysis. When dialysis is completed pt will be sent back to ED for reassessment.      Last OP HD orders (dec 2023):  3:15h  129kg  RUE AVG   Heparin 2000     - last hep B labs done here on --> 06/01/23   - pt typically comes on Tu, Th and Sat for hospital HD   Anemia of esrd:  Pt had darbe in late April and in early May, but Hb continued to drop, so dosing of darbepoeitin was increased up to 100.   Tsat was low in May at 11% --> we gave him 1 gm load over 4 HD sessions. Repeat tsat on 6/25 was not much better at 12%, ferr 450. Pt got another ferrlecit 250mg  x 3 in July.  Hb seems to be responding now, around 10 in early August.  Cont darbe q 1-2 wks.    Hb has been trending up in mid June but seems stuck in the 8-9 range so Darbe was ^d to 150 mcg po weekly.    The darbepoeitin needs to ordered for this patient as "once in dialysis" and not "SQ at 1800" because he needs to be ready to leave at 5 pm to catch his ride home.    HD access: Pt had new LUA AVG and LIJ TDC placed on 04/07/23 by Dr Edilia Bo. TDC was removed on 05/23/23. Using AVG now.       Date             Hb         Tsat/ ferritin    Darbe SQ       ferric gluconate 12/02/22            11.0       22%/ 935 01/24/23           9.0        31%/ 1056   03/16/23            8.5                             60 mcg 03/30/23            8.0                             60 mcg  04/11/23            8.1        11%/ 545      100 mcg 5/18                                                                             250mg  #1  04/24/23             8.0                               60 mcg  04/27/23             7.3                                                      250mg  #2 04/29/23                                                100 mcg           250mg  #3       6/06                 7.5                               150 mcg 6/08                 7.9                                                        250mg  #4 05/11/23            8.1                               05/18/23            8.2                                150 mcg          250mg   6/25                                  12%/ 450 05/27/23                                                  100 mcg 05/31/23            8.4                                 150 mcg           250mg      06/06/23  150 mcg         250mg  7/25                                                         150 mcg         250 mg 8/03     10.4 8/08   9.8         150 mcg  I was present at this dialysis session, have reviewed the session and made  appropriate changes Vinson Moselle MD  [facility] 07/08/2023, 1:29 PM

## 2023-07-11 ENCOUNTER — Emergency Department (HOSPITAL_COMMUNITY)
Admission: EM | Admit: 2023-07-11 | Discharge: 2023-07-11 | Disposition: A | Discharge status: Home / Self Care | Payer: 59 | Attending: Emergency Medicine | Admitting: Emergency Medicine

## 2023-07-11 ENCOUNTER — Encounter (HOSPITAL_COMMUNITY): Payer: Self-pay

## 2023-07-11 ENCOUNTER — Other Ambulatory Visit: Payer: Self-pay

## 2023-07-11 DIAGNOSIS — Z79899 Other long term (current) drug therapy: Secondary | ICD-10-CM | POA: Diagnosis not present

## 2023-07-11 DIAGNOSIS — N186 End stage renal disease: Secondary | ICD-10-CM | POA: Diagnosis not present

## 2023-07-11 DIAGNOSIS — R2243 Localized swelling, mass and lump, lower limb, bilateral: Secondary | ICD-10-CM | POA: Diagnosis not present

## 2023-07-11 DIAGNOSIS — R0682 Tachypnea, not elsewhere classified: Secondary | ICD-10-CM | POA: Insufficient documentation

## 2023-07-11 DIAGNOSIS — Z794 Long term (current) use of insulin: Secondary | ICD-10-CM | POA: Insufficient documentation

## 2023-07-11 DIAGNOSIS — Z7982 Long term (current) use of aspirin: Secondary | ICD-10-CM | POA: Insufficient documentation

## 2023-07-11 DIAGNOSIS — R0602 Shortness of breath: Secondary | ICD-10-CM | POA: Diagnosis not present

## 2023-07-11 DIAGNOSIS — Z992 Dependence on renal dialysis: Secondary | ICD-10-CM | POA: Insufficient documentation

## 2023-07-11 DIAGNOSIS — R0600 Dyspnea, unspecified: Secondary | ICD-10-CM | POA: Diagnosis present

## 2023-07-11 MED ORDER — HEPARIN SODIUM (PORCINE) 1000 UNIT/ML IJ SOLN
INTRAMUSCULAR | Status: AC
  Filled 2023-07-11: qty 2

## 2023-07-11 MED ORDER — CHLORHEXIDINE GLUCONATE CLOTH 2 % EX PADS: 6.0000 | MEDICATED_PAD | Freq: Every day | CUTANEOUS | Status: DC

## 2023-07-11 MED ORDER — HEPARIN SODIUM (PORCINE) 1000 UNIT/ML DIALYSIS: 1000.0000 [IU] | INTRAMUSCULAR | Status: DC | PRN

## 2023-07-11 MED ORDER — LIDOCAINE HCL (PF) 1 % IJ SOLN: 5.0000 mL | INTRAMUSCULAR | Status: DC | PRN

## 2023-07-11 MED ORDER — LIDOCAINE-PRILOCAINE 2.5-2.5 % EX CREA: 1.0000 | TOPICAL_CREAM | CUTANEOUS | Status: DC | PRN

## 2023-07-11 MED ORDER — ALTEPLASE 2 MG IJ SOLR: 2.0000 mg | Freq: Once | INTRAMUSCULAR | Status: DC | PRN

## 2023-07-11 MED ORDER — PENTAFLUOROPROP-TETRAFLUOROETH EX AERO: 1.0000 | INHALATION_SPRAY | CUTANEOUS | Status: DC | PRN

## 2023-07-11 MED ORDER — INSULIN ASPART 100 UNIT/ML IJ SOLN: 20.0000 [IU] | Freq: Once | INTRAMUSCULAR | Status: DC

## 2023-07-11 NOTE — Progress Notes (Signed)
We were asked to see this patient for dialysis. Pt was discharged from his OP HD unit in December 2023 due to behavioral issues. He no longer has a home outpatient HD unit. Pt came to ED today requesting hospital dialysis. The plan will be for "ED HD".  Pt will go to the dialysis unit upstairs when they are ready for the pt. Then the pt will get dialysis. When dialysis is completed pt will be sent back to ED for reassessment.      Last OP HD orders (dec 2023):  3:15h  129kg  RUE AVG   Heparin 2000     - last hep B labs done here on --> 06/01/23   - pt typically comes on Tu, Th and Sat for hospital HD   Anemia of esrd:  Pt had darbe in late April and in early May, but Hb continued to drop, so dosing of darbepoeitin was increased up to 100.   Tsat was low in May at 11% --> we gave him 1 gm load over 4 HD sessions. Repeat tsat on 6/25 was not much better at 12%, ferr 450. Pt got another ferrlecit 250mg  x 3 in July.  Hb seems to be responding now, around 10 in early August.  Cont darbe q 1-2 wks.    Hb has been trending up in mid June but seems stuck in the 8-9 range so Darbe was ^d to 150 mcg po weekly.    The darbepoeitin needs to ordered for this patient as "once in dialysis" and not "SQ at 1800" because he needs to be ready to leave at 5 pm to catch his ride home.    HD access: Pt had new LUA AVG and LIJ TDC placed on 04/07/23 by Dr Edilia Bo. TDC was removed on 05/23/23. Using AVG now.       Date             Hb         Tsat/ ferritin    Darbe SQ       ferric gluconate 12/02/22            11.0       22%/ 935 01/24/23           9.0        31%/ 1056   03/16/23            8.5                             60 mcg 03/30/23            8.0                             60 mcg  04/11/23            8.1        11%/ 545      100 mcg 5/18                                                                             250mg  #1 04/24/23  8.0                              60 mcg  04/27/23              7.3                                                      250mg  #2 04/29/23                                                100 mcg           250mg  #3       6/06                 7.5                               150 mcg 6/08                 7.9                                                        250mg  #4 05/11/23            8.1                               05/18/23            8.2                                150 mcg          250mg   6/25                                  12%/ 450 05/27/23                                                  100 mcg 05/31/23            8.4                                 150 mcg           250mg      06/06/23                                                    150 mcg  250mg  7/25                                                         150 mcg         250 mg 8/03                  10.4 8/08                  9.8                                    150 mcg  Salome Holmes, NP

## 2023-07-11 NOTE — ED Triage Notes (Signed)
Pt arrived via POV requesting his routine dialysis, denies any pain & A/Ox4.

## 2023-07-11 NOTE — Progress Notes (Signed)
Received patient in ED stretcher,alert and oriented x 4.Consent verified.  Access used: Left upper arm AVG that worked well.  Medicine given. Heparin 2,000 units pre-run dose.  Duration of treatment:  2.35 hous  Fluid removed. 1.3 liters  Hemo comment: He tolerated but he quit  on his last 54 minutes of his treatment.He signed an AMA .  Hand off to the patient's nurse.

## 2023-07-11 NOTE — ED Provider Notes (Signed)
[place] EMERGENCY DEPARTMENT AT [facility] Provider Note   CSN: [id number] Arrival date & time: 07/11/23  4098     History  Chief Complaint  Patient presents with   Dialysis    Jeffrey [name] is a 62 y.o. male.  HPI Patient presents with mild dyspnea consistent with need for dialysis. Patient last completed dialysis session 3 days ago, as scheduled.  Today he presents with no other new complaints, no other unusual concerns.    Home Medications Prior to Admission medications   Medication Sig Start Date End Date Taking? Authorizing Provider  amLODipine (NORVASC) 10 MG tablet Take 10 mg by mouth every morning. 04/17/20   [provider]  ASPIRIN LOW DOSE 81 MG EC tablet Take 81 mg by mouth in the morning. 02/22/21   [provider]  atorvastatin (LIPITOR) 40 MG tablet Take 40 mg by mouth in the morning. 02/09/21   [provider]  calcium acetate (PHOSLO) 667 MG capsule Take 2 capsules (1,334 mg total) by mouth with breakfast, with lunch, and with evening meal. 04/10/21   Calvert Cantor, MD  Calcium Carbonate Antacid (CALCIUM CARBONATE, DOSED IN MG ELEMENTAL CALCIUM,) 1250 MG/5ML SUSP Take 5 mLs (500 mg of elemental calcium total) by mouth every 6 (six) hours as needed for indigestion. 04/08/23   Rhetta Mura, MD  carvedilol (COREG) 12.5 MG tablet Take 12.5 mg by mouth 2 (two) times daily with a meal. 04/17/20   [provider]  Cholecalciferol (VITAMIN D) 50 MCG (2000 UT) CAPS Take 2,000 Units by mouth daily.    [provider]  clopidogrel (PLAVIX) 75 MG tablet Take 1 tablet (75 mg total) by mouth daily. 11/02/22   Rhyne, Ames Coupe, PA-C  cyclobenzaprine (FLEXERIL) 10 MG tablet Take 10 mg by mouth 3 (three) times daily as needed for muscle spasms. 12/29/21   [provider]  diazepam (VALIUM) 5 MG tablet Take 5 mg by mouth 2 (two) times daily as needed for muscle spasms. 12/09/21   [provider]  insulin  regular human CONCENTRATED (HUMULIN R U-500 KWIKPEN) 500 UNIT/ML KwikPen Inject 45 Units into the skin daily with supper. Patient taking differently: Inject 70-125 Units into the skin 2 (two) times daily with a meal. 09/04/21 08/30/24  [name], MD  metolazone (ZAROXOLYN) 10 MG tablet Take 10 mg by mouth daily. 07/13/21   [provider]  nortriptyline (PAMELOR) 10 MG capsule Take 20 mg by mouth at bedtime. 10/23/19 08/30/24  [provider]  pantoprazole (PROTONIX) 40 MG tablet Take 40 mg by mouth daily before breakfast. 02/09/21   [provider]  pregabalin (LYRICA) 75 MG capsule Take 1 capsule (75 mg total) by mouth See admin instructions. Daily.  Give after dialysis on dialysis days. 01/21/23   [name], MD  sertraline (ZOLOFT) 100 MG tablet Take 100 mg by mouth in the morning. 02/22/21   [provider]  tamsulosin (FLOMAX) 0.4 MG CAPS capsule Take 1 capsule (0.4 mg total) by mouth daily. 11/10/21   [name], MD  torsemide (DEMADEX) 20 MG tablet Take 120 mg by mouth 2 (two) times daily.    [provider]  traZODone (DESYREL) 150 MG tablet Take 150 mg by mouth at bedtime. 12/09/21   [provider]      Allergies    Actos [pioglitazone], Dexmedetomidine, Ibuprofen, Tomato, and Wellbutrin [bupropion]    Review of Systems   Review of Systems  All other systems reviewed and are  negative.   Physical Exam Updated Vital Signs BP (!) 174/119 (BP Location: Right Arm)   Pulse 86   Temp 98.4 F (36.9 C) (Oral)   Ht 5\' 5"  (1.651 m)   Wt (!) 136.5 kg   SpO2 97%   BMI 50.09 kg/m  Physical Exam Vitals and nursing note reviewed.  Constitutional:      Appearance: He is well-developed.  HENT:     Head: Normocephalic and atraumatic.  Eyes:     Conjunctiva/sclera: Conjunctivae normal.  Neck:     Vascular: No JVD.  Pulmonary:     Effort: No respiratory distress.     Breath sounds: No wheezing.     Comments: No increased  work of breathing mild tachypnea Abdominal:     General: There is no distension.  Musculoskeletal:     Comments: Edema to the bilateral lower extremities.  Bilateral amputations.  Skin:    Coloration: Skin is not pale.  Neurological:     Mental Status: He is alert and oriented to person, place, and time.  Psychiatric:        Behavior: Behavior normal.     ED Results / Procedures / Treatments   Labs (all labs ordered are listed, but only abnormal results are displayed) Labs Reviewed - No data to display  EKG None  Radiology No results found.  Procedures Procedures    Medications Ordered in ED Medications  Chlorhexidine Gluconate Cloth 2 % PADS 6 each (has no administration in time range)    ED Course/ Medical Decision Making/ A&P                                 Medical Decision Making Adult male with multiple medical issues include prior amputation, end-stage renal disease presents with need for dialysis with mild dyspnea, tachypnea.  No hypoxia, no fever, patient's amount of other new concerns is reassuring.  Case discussed with our nephrology colleagues, for dialysis.  Amount and/or Complexity of Data Reviewed External Data Reviewed: notes.    Details: I reviewed the patient's notes including my own from last month. Discussion of management or test interpretation with external provider(s): Nephrology  Risk Diagnosis or treatment significantly limited by social determinants of health.  Final Clinical Impression(s) / ED Diagnoses Final diagnoses:  SOB (shortness of breath)     Gerhard Munch, MD 07/11/23 1040

## 2023-07-13 ENCOUNTER — Other Ambulatory Visit: Payer: Self-pay

## 2023-07-13 ENCOUNTER — Emergency Department (HOSPITAL_COMMUNITY)
Admission: EM | Admit: 2023-07-13 | Discharge: 2023-07-14 | Disposition: A | Discharge status: Home / Self Care | Payer: 59 | Source: Home / Self Care | Attending: Emergency Medicine | Admitting: Emergency Medicine

## 2023-07-13 DIAGNOSIS — Z89612 Acquired absence of left leg above knee: Secondary | ICD-10-CM | POA: Insufficient documentation

## 2023-07-13 DIAGNOSIS — Z7902 Long term (current) use of antithrombotics/antiplatelets: Secondary | ICD-10-CM | POA: Insufficient documentation

## 2023-07-13 DIAGNOSIS — Z992 Dependence on renal dialysis: Secondary | ICD-10-CM | POA: Insufficient documentation

## 2023-07-13 DIAGNOSIS — M79602 Pain in left arm: Secondary | ICD-10-CM | POA: Diagnosis not present

## 2023-07-13 DIAGNOSIS — N186 End stage renal disease: Secondary | ICD-10-CM | POA: Insufficient documentation

## 2023-07-13 DIAGNOSIS — J449 Chronic obstructive pulmonary disease, unspecified: Secondary | ICD-10-CM | POA: Insufficient documentation

## 2023-07-13 DIAGNOSIS — Z7982 Long term (current) use of aspirin: Secondary | ICD-10-CM | POA: Insufficient documentation

## 2023-07-13 DIAGNOSIS — S4492XA Injury of unspecified nerve at shoulder and upper arm level, left arm, initial encounter: Secondary | ICD-10-CM | POA: Diagnosis not present

## 2023-07-13 MED ORDER — ALTEPLASE 2 MG IJ SOLR: 2.0000 mg | Freq: Once | INTRAMUSCULAR | Status: DC | PRN

## 2023-07-13 MED ORDER — CHLORHEXIDINE GLUCONATE CLOTH 2 % EX PADS: 6.0000 | MEDICATED_PAD | Freq: Every day | CUTANEOUS | Status: DC

## 2023-07-13 MED ORDER — HEPARIN SODIUM (PORCINE) 1000 UNIT/ML DIALYSIS
1000.0000 [IU] | INTRAMUSCULAR | Status: DC | PRN
  Administered 2023-07-13: 2000 [IU] via INTRAVENOUS_CENTRAL
  Filled 2023-07-13 (×3): qty 1

## 2023-07-13 MED ORDER — LIDOCAINE HCL (PF) 1 % IJ SOLN: 5.0000 mL | INTRAMUSCULAR | Status: DC | PRN

## 2023-07-13 MED ORDER — LIDOCAINE-PRILOCAINE 2.5-2.5 % EX CREA: 1.0000 | TOPICAL_CREAM | CUTANEOUS | Status: DC | PRN

## 2023-07-13 MED ORDER — PENTAFLUOROPROP-TETRAFLUOROETH EX AERO: 1.0000 | INHALATION_SPRAY | CUTANEOUS | Status: DC | PRN

## 2023-07-13 NOTE — ED Notes (Signed)
Pt was stuck. Not successful.

## 2023-07-13 NOTE — Procedures (Signed)
Patient was seen on dialysis and the procedure was supervised.  BFR 400, BP 135/63, Access  RUE AVG. Labs at goal.   Patient appears to be tolerating treatment well. D/w dialysis nurse.   Jaynie Collins 07/13/2023

## 2023-07-13 NOTE — ED Triage Notes (Signed)
Patient arrives without complaint for routine dialysis tx.

## 2023-07-13 NOTE — ED Notes (Addendum)
This RN tried to straight stick patient for I-stat chem 8 lab.  Unsuccessful.  Phlebotomy called to bedside.  DO Dakermanji and MD Zavitz notified and aware.

## 2023-07-13 NOTE — ED Provider Notes (Signed)
[place] EMERGENCY DEPARTMENT AT [facility] Provider Note   CSN: [id number] Arrival date & time: 07/13/23  4098     History  No chief complaint on file.   Jeffrey [name] is a 62 y.o. male.  HPI    Patient is a 61yM w/ PMHx of  ESRD, COPD, and left above the knee amputation, presenting today for routine dialysis.  He states he comes here for dialysis every Tuesday, Thursday, Saturday.  He has not missed any sessions.  He has not had any shortened sessions.  He denies any increased shortness of breath, edema, chest pain, or lightheadedness.  He is on his home level of oxygen.  On chart review, he does come to the ED for access to his dialysis.  He was last present on 8/13.  Home Medications Prior to Admission medications   Medication Sig Start Date End Date Taking? Authorizing Provider  amLODipine (NORVASC) 10 MG tablet Take 10 mg by mouth every morning. 04/17/20   [provider]  ASPIRIN LOW DOSE 81 MG EC tablet Take 81 mg by mouth in the morning. 02/22/21   [provider]  atorvastatin (LIPITOR) 40 MG tablet Take 40 mg by mouth in the morning. 02/09/21   [provider]  calcium acetate (PHOSLO) 667 MG capsule Take 2 capsules (1,334 mg total) by mouth with breakfast, with lunch, and with evening meal. 04/10/21   Calvert Cantor, MD  Calcium Carbonate Antacid (CALCIUM CARBONATE, DOSED IN MG ELEMENTAL CALCIUM,) 1250 MG/5ML SUSP Take 5 mLs (500 mg of elemental calcium total) by mouth every 6 (six) hours as needed for indigestion. 04/08/23   Rhetta Mura, MD  carvedilol (COREG) 12.5 MG tablet Take 12.5 mg by mouth 2 (two) times daily with a meal. 04/17/20   [provider]  Cholecalciferol (VITAMIN D) 50 MCG (2000 UT) CAPS Take 2,000 Units by mouth daily.    [provider]  clopidogrel (PLAVIX) 75 MG tablet Take 1 tablet (75 mg total) by mouth daily. 11/02/22   Rhyne, Ames Coupe, PA-C  cyclobenzaprine (FLEXERIL) 10 MG tablet Take 10  mg by mouth 3 (three) times daily as needed for muscle spasms. 12/29/21   [provider]  diazepam (VALIUM) 5 MG tablet Take 5 mg by mouth 2 (two) times daily as needed for muscle spasms. 12/09/21   [provider]  insulin regular human CONCENTRATED (HUMULIN R U-500 KWIKPEN) 500 UNIT/ML KwikPen Inject 45 Units into the skin daily with supper. Patient taking differently: Inject 70-125 Units into the skin 2 (two) times daily with a meal. 09/04/21 08/30/24  [name], MD  metolazone (ZAROXOLYN) 10 MG tablet Take 10 mg by mouth daily. 07/13/21   [provider]  nortriptyline (PAMELOR) 10 MG capsule Take 20 mg by mouth at bedtime. 10/23/19 08/30/24  [provider]  pantoprazole (PROTONIX) 40 MG tablet Take 40 mg by mouth daily before breakfast. 02/09/21   [provider]  pregabalin (LYRICA) 75 MG capsule Take 1 capsule (75 mg total) by mouth See admin instructions. Daily.  Give after dialysis on dialysis days. 01/21/23   [name], MD  sertraline (ZOLOFT) 100 MG tablet Take 100 mg by mouth in the morning. 02/22/21   [provider]  tamsulosin (FLOMAX) 0.4 MG CAPS capsule Take 1 capsule (0.4 mg total) by mouth daily. 11/10/21   [name], MD  torsemide (DEMADEX) 20 MG tablet Take 120 mg by mouth 2 (two) times daily.    [provider]  traZODone (DESYREL) 150 MG tablet Take 150 mg by mouth at bedtime. 12/09/21   [provider]      Allergies    Actos [pioglitazone], Dexmedetomidine, Ibuprofen, Tomato, and Wellbutrin [bupropion]    Review of Systems   Review of Systems Negative except for as noted above in HPI  Physical Exam Updated Vital Signs BP 127/65 (BP Location: Right Wrist)   Pulse (!) 101   Temp 98.1 F (36.7 C) (Oral)   Resp 14   SpO2 100%  Physical Exam Vitals and nursing note reviewed.  Constitutional:      General: He is not in acute distress.    Appearance: He is well-developed.  HENT:      Head: Normocephalic and atraumatic.  Eyes:     Conjunctiva/sclera: Conjunctivae normal.  Cardiovascular:     Rate and Rhythm: Normal rate and regular rhythm.     Heart sounds: No murmur heard. Pulmonary:     Effort: Pulmonary effort is normal. No respiratory distress.     Breath sounds: Normal breath sounds.     Comments: Saturating well on home 3 L O2 via nasal cannula Abdominal:     Palpations: Abdomen is soft.     Tenderness: There is no abdominal tenderness.  Musculoskeletal:        General: No swelling or tenderness.     Cervical back: Neck supple.  Skin:    General: Skin is warm and dry.     Capillary Refill: Capillary refill takes less than 2 seconds.     Findings: No rash.  Neurological:     Mental Status: He is alert and oriented to person, place, and time.  Psychiatric:        Mood and Affect: Mood normal.     ED Results / Procedures / Treatments   Labs (all labs ordered are listed, but only abnormal results are displayed) Labs Reviewed  CBC  RENAL FUNCTION PANEL    EKG None  Radiology No results found.    Medications Ordered in ED Medications  Chlorhexidine Gluconate Cloth 2 % PADS 6 each (has no administration in time range)  pentafluoroprop-tetrafluoroeth (GEBAUERS) aerosol 1 Application (has no administration in time range)  lidocaine (PF) (XYLOCAINE) 1 % injection 5 mL (has no administration in time range)  lidocaine-prilocaine (EMLA) cream 1 Application (has no administration in time range)  heparin injection 1,000 Units (2,000 Units Dialysis Given 07/13/23 1419)  alteplase (CATHFLO ACTIVASE) injection 2 mg (has no administration in time range)    ED Course/ Medical Decision Making/ A&P                                Medical Decision Making Problems Addressed: ESRD (end stage renal disease) (HCC): chronic illness or injury that poses a threat to life or bodily functions  Amount and/or Complexity of Data Reviewed External Data Reviewed: labs  and notes.    Patient is a 5yM w/ PMHx of  ESRD, COPD, and left above the knee amputation, presenting today for routine dialysis.  On exam, patient is alert and oriented.  He is in regular rate and rhythm.  Lungs are clear to auscultation on his tight liters O2 via nasal cannula.  He has no evidence of significant edema to his right lower extremity or his abdomen.  Discussed case with nephrology who is aware of the patient.  They will plan to take him for dialysis today.  He is hemodynamically stable and appropriate for transfer to the dialysis unit.  Final Clinical Impression(s) / ED Diagnoses Final diagnoses:  ESRD (end stage renal disease) [facility])    Rx / DC Orders ED Discharge Orders     None         Rhys Martini, DO 07/13/23 1745    Blane Ohara, MD 07/16/23 [phone]

## 2023-07-13 NOTE — Progress Notes (Addendum)
We were asked to see this patient for dialysis. Pt was discharged from his OP HD unit in December 2023 due to behavioral issues. He no longer has a home outpatient HD unit. Pt came to ED today requesting hospital dialysis. The plan will be for "ED HD".  Pt will go to the dialysis unit upstairs when they are ready for the pt. Then the pt will get dialysis. When dialysis is completed pt will be sent back to ED for reassessment.      Last OP HD orders (dec 2023):  3:15h  129kg  RUE AVG   Heparin 2000     - last hep B labs done here on --> 07/01/23   - pt typically comes on Tu, Th and Sat for hospital HD   Anemia of esrd:  Pt had darbe in late April and in early May, but Hb continued to drop, so dosing of darbepoeitin was increased up to 100.   Tsat was low in May at 11% --> we gave him 1 gm load over 4 HD sessions. Repeat tsat on 6/25 was not much better at 12%, ferr 450. Pt got another ferrlecit 250mg  x 3 in July.  Hb seems to be responding now, around 10 in early August.  Cont darbe q 1-2 wks.    Hb has been trending up in mid June but seems stuck in the 8-9 range so Darbe was ^d to 150 mcg po weekly.    The darbepoeitin needs to ordered for this patient as "once in dialysis" and not "SQ at 1800" because he needs to be ready to leave at 5 pm to catch his ride home.   Plan is to obtain labs with HD today. Last labs drawn on 07/08/23.   HD access: Pt had new LUA AVG and LIJ TDC placed on 04/07/23 by Dr Edilia Bo. TDC was removed on 05/23/23. Using AVG now.       Date             Hb         Tsat/ ferritin    Darbe SQ       ferric gluconate 12/02/22            11.0       22%/ 935 01/24/23           9.0        31%/ 1056   03/16/23            8.5                             60 mcg 03/30/23            8.0                             60 mcg  04/11/23            8.1        11%/ 545      100 mcg 5/18  250mg  #1 04/24/23              8.0                              60 mcg  04/27/23             7.3                                                      250mg  #2 04/29/23                                                100 mcg           250mg  #3       6/06                 7.5                               150 mcg 6/08                 7.9                                                        250mg  #4 05/11/23            8.1                               05/18/23            8.2                                150 mcg          250mg   6/25                                  12%/ 450 05/27/23                                                  100 mcg 05/31/23            8.4                                 150 mcg           250mg      06/06/23  150 mcg         250mg  7/25                                                         150 mcg         250 mg 8/03                  10.4 8/08                  9.8                                    150 mcg  Salome Holmes, NP

## 2023-07-13 NOTE — ED Notes (Signed)
Hemodialysis consent signed.

## 2023-07-13 NOTE — Progress Notes (Signed)
Received patient in bed to unit.  Alert and oriented.  Informed consent signed and in chart.   TX duration:1 hour and 47 minutes, patient self terminated dialysis and signed AMA paperwork.  Patient tolerated well.  Transported back to the room  Alert, without acute distress.  Hand-off given to patient's nurse.   Access used: Left Upper Arm Graft Access issues: none  Total UF removed: Medication(s) given: see MAR   07/13/23 1608  Vitals  Temp 98.1 F (36.7 C)  Temp Source Oral  BP (!) 125/58  MAP (mmHg) 72  BP Location Right Wrist  BP Method Automatic  Patient Position (if appropriate) Lying  Pulse Rate 100  Pulse Rate Source Monitor  ECG Heart Rate 99  Resp 20  MEWS COLOR  MEWS Score Color Green  Oxygen Therapy  SpO2 100 %  O2 Device Nasal Cannula  O2 Flow Rate (L/min) 3 L/min  MEWS Score  MEWS Temp 0  MEWS Systolic 0  MEWS Pulse 0  MEWS RR 0  MEWS LOC 0  MEWS Score 0     Stacie Glaze LPN [facility]

## 2023-07-13 NOTE — ED Notes (Signed)
Phlebotomy at bedside.

## 2023-07-15 ENCOUNTER — Encounter (HOSPITAL_COMMUNITY): Payer: Self-pay

## 2023-07-15 ENCOUNTER — Other Ambulatory Visit: Payer: Self-pay

## 2023-07-15 ENCOUNTER — Emergency Department (HOSPITAL_COMMUNITY)
Admission: EM | Admit: 2023-07-15 | Discharge: 2023-07-15 | Disposition: A | Discharge status: Home / Self Care | Payer: 59 | Source: Home / Self Care | Attending: Emergency Medicine | Admitting: Emergency Medicine

## 2023-07-15 DIAGNOSIS — Z992 Dependence on renal dialysis: Secondary | ICD-10-CM | POA: Insufficient documentation

## 2023-07-15 DIAGNOSIS — N186 End stage renal disease: Secondary | ICD-10-CM | POA: Insufficient documentation

## 2023-07-15 DIAGNOSIS — J9611 Chronic respiratory failure with hypoxia: Secondary | ICD-10-CM

## 2023-07-15 DIAGNOSIS — J449 Chronic obstructive pulmonary disease, unspecified: Secondary | ICD-10-CM | POA: Insufficient documentation

## 2023-07-15 DIAGNOSIS — Z794 Long term (current) use of insulin: Secondary | ICD-10-CM | POA: Insufficient documentation

## 2023-07-15 DIAGNOSIS — Z7982 Long term (current) use of aspirin: Secondary | ICD-10-CM | POA: Insufficient documentation

## 2023-07-15 MED ORDER — HEPARIN SODIUM (PORCINE) 1000 UNIT/ML DIALYSIS
2000.0000 [IU] | Freq: Once | INTRAMUSCULAR | Status: AC
  Administered 2023-07-15: 2000 [IU] via INTRAVENOUS_CENTRAL
  Filled 2023-07-15 (×2): qty 2

## 2023-07-15 MED ORDER — CHLORHEXIDINE GLUCONATE CLOTH 2 % EX PADS: 6.0000 | MEDICATED_PAD | Freq: Every day | CUTANEOUS | Status: DC

## 2023-07-15 NOTE — Procedures (Signed)
HD Note:  Some information was entered later than the data was gathered due to patient care needs. The stated time with the data is accurate.  Received patient on stretcher from the ER to unit.   Alert and oriented.   Informed consent signed and in chart.   Patient K+ bath changed from a 2 to 3 based on most recent labs.  Treatment ended early related to intense cramping of right leg  Transported back to the ER for evaluation to be discharged home  Alert, without acute distress.   Access used: Left upper arm graft Access issues: None  Total UF removed: 1200 ml,  1400 ml removed, 200 ml returned when cramping started.  Hand-off given to patient's nurse.   Bartosz Luginbill L. Dareen Piano, RN [facility].

## 2023-07-15 NOTE — ED Notes (Signed)
Dialysis called for report. State they are coming down to take pt to dialysis.

## 2023-07-15 NOTE — ED Provider Notes (Signed)
[place] EMERGENCY DEPARTMENT AT [facility] Provider Note   CSN: [id number] Arrival date & time: 07/15/23  0450     History  Chief Complaint  Patient presents with    ESRD needs dialysis    Jeffrey [name] is a 62 y.o. male ESRD, COPD, left AKA here for evaluation of requesting dialysis.  He has no complaints.  Last session on Thursday states he only had a 2-hour session due to timing.  No chest pain, shortness of breath.  Wears 3 L of oxygen via nasal cannula at baseline.  Utilizes emergency department for his dialysis.  HPI     Home Medications Prior to Admission medications   Medication Sig Start Date End Date Taking? Authorizing Provider  amLODipine (NORVASC) 10 MG tablet Take 10 mg by mouth every morning. 04/17/20   [provider]  ASPIRIN LOW DOSE 81 MG EC tablet Take 81 mg by mouth in the morning. 02/22/21   [provider]  atorvastatin (LIPITOR) 40 MG tablet Take 40 mg by mouth in the morning. 02/09/21   [provider]  calcium acetate (PHOSLO) 667 MG capsule Take 2 capsules (1,334 mg total) by mouth with breakfast, with lunch, and with evening meal. 04/10/21   Calvert Cantor, MD  Calcium Carbonate Antacid (CALCIUM CARBONATE, DOSED IN MG ELEMENTAL CALCIUM,) 1250 MG/5ML SUSP Take 5 mLs (500 mg of elemental calcium total) by mouth every 6 (six) hours as needed for indigestion. 04/08/23   Rhetta Mura, MD  carvedilol (COREG) 12.5 MG tablet Take 12.5 mg by mouth 2 (two) times daily with a meal. 04/17/20   [provider]  Cholecalciferol (VITAMIN D) 50 MCG (2000 UT) CAPS Take 2,000 Units by mouth daily.    [provider]  clopidogrel (PLAVIX) 75 MG tablet Take 1 tablet (75 mg total) by mouth daily. 11/02/22   Rhyne, Ames Coupe, PA-C  cyclobenzaprine (FLEXERIL) 10 MG tablet Take 10 mg by mouth 3 (three) times daily as needed for muscle spasms. 12/29/21   [provider]  diazepam (VALIUM) 5 MG tablet Take 5 mg by  mouth 2 (two) times daily as needed for muscle spasms. 12/09/21   [provider]  insulin regular human CONCENTRATED (HUMULIN R U-500 KWIKPEN) 500 UNIT/ML KwikPen Inject 45 Units into the skin daily with supper. Patient taking differently: Inject 70-125 Units into the skin 2 (two) times daily with a meal. 09/04/21 08/30/24  [name], MD  metolazone (ZAROXOLYN) 10 MG tablet Take 10 mg by mouth daily. 07/13/21   [provider]  nortriptyline (PAMELOR) 10 MG capsule Take 20 mg by mouth at bedtime. 10/23/19 08/30/24  [provider]  pantoprazole (PROTONIX) 40 MG tablet Take 40 mg by mouth daily before breakfast. 02/09/21   [provider]  pregabalin (LYRICA) 75 MG capsule Take 1 capsule (75 mg total) by mouth See admin instructions. Daily.  Give after dialysis on dialysis days. 01/21/23   [name], MD  sertraline (ZOLOFT) 100 MG tablet Take 100 mg by mouth in the morning. 02/22/21   [provider]  tamsulosin (FLOMAX) 0.4 MG CAPS capsule Take 1 capsule (0.4 mg total) by mouth daily. 11/10/21   [name], MD  torsemide (DEMADEX) 20 MG tablet Take 120 mg by mouth 2 (two) times daily.    [provider]  traZODone (DESYREL) 150 MG tablet Take 150 mg by mouth at bedtime. 12/09/21   [provider]      Allergies  Actos [pioglitazone], Dexmedetomidine, Ibuprofen, Tomato, and Wellbutrin [bupropion]    Review of Systems   Review of Systems  Constitutional: Negative.   HENT: Negative.    Respiratory: Negative.    Cardiovascular: Negative.   Gastrointestinal: Negative.   Genitourinary: Negative.   Musculoskeletal: Negative.   Skin: Negative.   Neurological: Negative.   All other systems reviewed and are negative.   Physical Exam Updated Vital Signs BP (!) 161/67   Pulse 90   Temp 98.3 F (36.8 C)   Resp (!) 22   SpO2 100%  Physical Exam Vitals and nursing note reviewed.  Constitutional:      General: He is not  in acute distress.    Appearance: He is well-developed. He is not ill-appearing or diaphoretic.  HENT:     Head: Atraumatic.  Eyes:     Pupils: Pupils are equal, round, and reactive to light.  Cardiovascular:     Rate and Rhythm: Normal rate and regular rhythm.     Pulses: Normal pulses.     Heart sounds: Normal heart sounds.  Pulmonary:     Effort: Pulmonary effort is normal. No respiratory distress.     Breath sounds: Normal breath sounds.  Abdominal:     General: There is no distension.     Palpations: Abdomen is soft.  Musculoskeletal:        General: Normal range of motion.     Cervical back: Normal range of motion and neck supple.     Comments: AKA  Skin:    General: Skin is warm and dry.  Neurological:     General: No focal deficit present.     Mental Status: He is alert and oriented to person, place, and time.     ED Results / Procedures / Treatments   Labs (all labs ordered are listed, but only abnormal results are displayed) Labs Reviewed - No data to display   EKG None  Radiology No results found.  Procedures Procedures    Medications Ordered in ED Medications  Chlorhexidine Gluconate Cloth 2 % PADS 6 each (has no administration in time range)  heparin injection 2,000 Units (2,000 Units Dialysis Given 07/15/23 0900)    ED Course/ Medical Decision Making/ A&P    62 year old well-known to the emergency department as well as myself here for evaluation of requesting dialysis.  Utilizes the emergency department for his weekly dialysis session, last session on Thursday.  He is asymptomatic.  His heart and lungs are clear.  Stable vital signs.  Will touch base with nephrology  Dr .Arlean Hopping with Nephrology will plan to dialyze today.  Patient off to dialysis.  Anticipate once he returns if he is at baseline discharge home.  Patient back to the ED after getting dialysis.  After I reevaluate patient he eloped from the emergency department                                   Medical Decision Making Amount and/or Complexity of Data Reviewed External Data Reviewed: labs and notes. Labs: ordered.  Risk OTC drugs. Decision regarding hospitalization. Diagnosis or treatment significantly limited by social determinants of health.           Final Clinical Impression(s) / ED Diagnoses Final diagnoses:  ESRD needing dialysis (HCC)  Chronic hypoxic respiratory failure (HCC)    Rx / DC Orders ED Discharge Orders     None  Terricka Onofrio A, PA-C 07/15/23 1244    Arby Barrette, MD 07/15/23 1554

## 2023-07-15 NOTE — ED Triage Notes (Signed)
Pt arrived via POV stating that he needs dialysis, zero other needs s/sx expressed

## 2023-07-15 NOTE — Procedures (Addendum)
We were asked to see this patient for dialysis. Pt was discharged from his OP HD unit in December 2023 due to behavioral issues. He no longer has a home outpatient HD unit. Pt came to ED today requesting hospital dialysis. The plan will be for "ED HD". Patient is not being admitted. Pt will go to the dialysis unit upstairs when they are ready for the pt. Then the pt will get dialysis. When dialysis is completed pt will be sent back to ED for reassessment.      Last OP HD orders (dec 2023):  3:15h  129kg  RUE AVG   Heparin 2000     - last hep B labs done here on --> 07/01/23   - pt typically comes on Tu, Th and Sat for hospital HD   Anemia of esrd:  Pt had darbe in late April and in early May, but Hb continued to drop, so dosing of darbepoeitin was increased up to 100.   Tsat was low in May at 11% --> we gave him 1 gm load over 4 HD sessions. Repeat tsat on 6/25 was not much better at 12%, ferr 450. Pt got another ferrlecit 250mg  x 3 in July.  Hb seems to be responding now, around 10 in early August.  Cont darbe q 1-2 wks.    Hb has been trending up in mid June but seems stuck in the 8-9 range so Darbe was ^d to 150 mcg po weekly.    The darbepoeitin needs to ordered for this patient as "once in dialysis" and not "SQ at 1800" because he needs to be ready to leave at 5 pm to catch his ride home.    HD access: Pt had new LUA AVG and LIJ TDC placed on 04/07/23 by Dr Edilia Bo. TDC was removed on 05/23/23. Using AVG now.       Date             Hb         Tsat/ ferritin    Darbe SQ       ferric gluconate 12/02/22            11.0       22%/ 935 01/24/23           9.0        31%/ 1056   03/16/23            8.5                             60 mcg 03/30/23            8.0                             60 mcg  04/11/23            8.1        11%/ 545      100 mcg 5/18                                                                             250mg  #1  04/24/23             8.0                               60 mcg  04/27/23             7.3                                                      250mg  #2 04/29/23                                                100 mcg           250mg  #3       6/06                 7.5                               150 mcg 6/08                 7.9                                                        250mg  #4 05/11/23            8.1                               05/18/23            8.2                                150 mcg          250mg   6/25                                  12%/ 450 05/27/23                                                  100 mcg 05/31/23            8.4                                 150 mcg           250mg      06/06/23  150 mcg         250mg  7/25                                                         150 mcg         250 mg 8/03                  10.4 8/10                  10.3                                150 mcg  Vinson Moselle  MD  [facility] 07/15/2023, 12:09 PM

## 2023-07-17 ENCOUNTER — Emergency Department (HOSPITAL_COMMUNITY): Payer: 59

## 2023-07-17 ENCOUNTER — Inpatient Hospital Stay (HOSPITAL_COMMUNITY)
Admission: EM | Admit: 2023-07-17 | Discharge: 2023-07-23 | DRG: 073 | Disposition: A | Discharge status: Home / Self Care | Payer: 59 | Attending: Family Medicine | Admitting: Family Medicine

## 2023-07-17 ENCOUNTER — Other Ambulatory Visit: Payer: Self-pay

## 2023-07-17 ENCOUNTER — Emergency Department (HOSPITAL_BASED_OUTPATIENT_CLINIC_OR_DEPARTMENT_OTHER): Payer: 59

## 2023-07-17 ENCOUNTER — Encounter (HOSPITAL_COMMUNITY): Payer: Self-pay

## 2023-07-17 DIAGNOSIS — T8089XA Other complications following infusion, transfusion and therapeutic injection, initial encounter: Secondary | ICD-10-CM | POA: Diagnosis present

## 2023-07-17 DIAGNOSIS — J9621 Acute and chronic respiratory failure with hypoxia: Secondary | ICD-10-CM

## 2023-07-17 DIAGNOSIS — G546 Phantom limb syndrome with pain: Secondary | ICD-10-CM | POA: Diagnosis present

## 2023-07-17 DIAGNOSIS — J9611 Chronic respiratory failure with hypoxia: Secondary | ICD-10-CM | POA: Diagnosis present

## 2023-07-17 DIAGNOSIS — H53139 Sudden visual loss, unspecified eye: Secondary | ICD-10-CM | POA: Diagnosis not present

## 2023-07-17 DIAGNOSIS — Z91018 Allergy to other foods: Secondary | ICD-10-CM

## 2023-07-17 DIAGNOSIS — N2581 Secondary hyperparathyroidism of renal origin: Secondary | ICD-10-CM | POA: Diagnosis present

## 2023-07-17 DIAGNOSIS — Z9981 Dependence on supplemental oxygen: Secondary | ICD-10-CM

## 2023-07-17 DIAGNOSIS — M109 Gout, unspecified: Secondary | ICD-10-CM | POA: Diagnosis present

## 2023-07-17 DIAGNOSIS — F32A Depression, unspecified: Secondary | ICD-10-CM | POA: Diagnosis present

## 2023-07-17 DIAGNOSIS — S4492XA Injury of unspecified nerve at shoulder and upper arm level, left arm, initial encounter: Secondary | ICD-10-CM | POA: Diagnosis not present

## 2023-07-17 DIAGNOSIS — E1122 Type 2 diabetes mellitus with diabetic chronic kidney disease: Secondary | ICD-10-CM | POA: Diagnosis present

## 2023-07-17 DIAGNOSIS — G4733 Obstructive sleep apnea (adult) (pediatric): Secondary | ICD-10-CM | POA: Diagnosis present

## 2023-07-17 DIAGNOSIS — I5032 Chronic diastolic (congestive) heart failure: Secondary | ICD-10-CM | POA: Diagnosis present

## 2023-07-17 DIAGNOSIS — Y841 Kidney dialysis as the cause of abnormal reaction of the patient, or of later complication, without mention of misadventure at the time of the procedure: Secondary | ICD-10-CM | POA: Diagnosis present

## 2023-07-17 DIAGNOSIS — E785 Hyperlipidemia, unspecified: Secondary | ICD-10-CM | POA: Diagnosis present

## 2023-07-17 DIAGNOSIS — F4312 Post-traumatic stress disorder, chronic: Secondary | ICD-10-CM | POA: Diagnosis present

## 2023-07-17 DIAGNOSIS — I132 Hypertensive heart and chronic kidney disease with heart failure and with stage 5 chronic kidney disease, or end stage renal disease: Secondary | ICD-10-CM | POA: Diagnosis present

## 2023-07-17 DIAGNOSIS — Z992 Dependence on renal dialysis: Secondary | ICD-10-CM | POA: Diagnosis not present

## 2023-07-17 DIAGNOSIS — Z6841 Body Mass Index (BMI) 40.0 and over, adult: Secondary | ICD-10-CM | POA: Diagnosis not present

## 2023-07-17 DIAGNOSIS — H5340 Unspecified visual field defects: Secondary | ICD-10-CM | POA: Diagnosis not present

## 2023-07-17 DIAGNOSIS — E1169 Type 2 diabetes mellitus with other specified complication: Secondary | ICD-10-CM | POA: Diagnosis present

## 2023-07-17 DIAGNOSIS — M79602 Pain in left arm: Principal | ICD-10-CM

## 2023-07-17 DIAGNOSIS — E1151 Type 2 diabetes mellitus with diabetic peripheral angiopathy without gangrene: Secondary | ICD-10-CM | POA: Diagnosis present

## 2023-07-17 DIAGNOSIS — I82C11 Acute embolism and thrombosis of right internal jugular vein: Secondary | ICD-10-CM | POA: Diagnosis present

## 2023-07-17 DIAGNOSIS — Z794 Long term (current) use of insulin: Secondary | ICD-10-CM | POA: Diagnosis not present

## 2023-07-17 DIAGNOSIS — K219 Gastro-esophageal reflux disease without esophagitis: Secondary | ICD-10-CM | POA: Diagnosis present

## 2023-07-17 DIAGNOSIS — Z89612 Acquired absence of left leg above knee: Secondary | ICD-10-CM | POA: Diagnosis not present

## 2023-07-17 DIAGNOSIS — Z886 Allergy status to analgesic agent status: Secondary | ICD-10-CM

## 2023-07-17 DIAGNOSIS — J449 Chronic obstructive pulmonary disease, unspecified: Secondary | ICD-10-CM | POA: Diagnosis present

## 2023-07-17 DIAGNOSIS — Z7902 Long term (current) use of antithrombotics/antiplatelets: Secondary | ICD-10-CM

## 2023-07-17 DIAGNOSIS — Z888 Allergy status to other drugs, medicaments and biological substances status: Secondary | ICD-10-CM

## 2023-07-17 DIAGNOSIS — D631 Anemia in chronic kidney disease: Secondary | ICD-10-CM | POA: Diagnosis present

## 2023-07-17 DIAGNOSIS — N186 End stage renal disease: Secondary | ICD-10-CM | POA: Diagnosis present

## 2023-07-17 DIAGNOSIS — E1165 Type 2 diabetes mellitus with hyperglycemia: Secondary | ICD-10-CM | POA: Diagnosis not present

## 2023-07-17 DIAGNOSIS — Z8614 Personal history of Methicillin resistant Staphylococcus aureus infection: Secondary | ICD-10-CM

## 2023-07-17 DIAGNOSIS — I252 Old myocardial infarction: Secondary | ICD-10-CM

## 2023-07-17 DIAGNOSIS — Y718 Miscellaneous cardiovascular devices associated with adverse incidents, not elsewhere classified: Secondary | ICD-10-CM | POA: Diagnosis present

## 2023-07-17 DIAGNOSIS — Z87891 Personal history of nicotine dependence: Secondary | ICD-10-CM

## 2023-07-17 DIAGNOSIS — Z79899 Other long term (current) drug therapy: Secondary | ICD-10-CM

## 2023-07-17 DIAGNOSIS — D72829 Elevated white blood cell count, unspecified: Secondary | ICD-10-CM | POA: Diagnosis present

## 2023-07-17 DIAGNOSIS — E790 Hyperuricemia without signs of inflammatory arthritis and tophaceous disease: Secondary | ICD-10-CM | POA: Insufficient documentation

## 2023-07-17 DIAGNOSIS — M898X9 Other specified disorders of bone, unspecified site: Secondary | ICD-10-CM | POA: Diagnosis present

## 2023-07-17 DIAGNOSIS — E1140 Type 2 diabetes mellitus with diabetic neuropathy, unspecified: Secondary | ICD-10-CM | POA: Diagnosis present

## 2023-07-17 DIAGNOSIS — Z7982 Long term (current) use of aspirin: Secondary | ICD-10-CM

## 2023-07-17 DIAGNOSIS — G8929 Other chronic pain: Secondary | ICD-10-CM | POA: Diagnosis present

## 2023-07-17 LAB — COMPREHENSIVE METABOLIC PANEL
ALT: 14 U/L (ref 0–44)
AST: 15 U/L (ref 15–41)
Albumin: 3.5 g/dL (ref 3.5–5.0)
Alkaline Phosphatase: 84 U/L (ref 38–126)
Anion gap: 14 (ref 5–15)
BUN: 78 mg/dL — ABNORMAL HIGH (ref 8–23)
CO2: 30 mmol/L (ref 22–32)
Calcium: 7.1 mg/dL — ABNORMAL LOW (ref 8.9–10.3)
Chloride: 89 mmol/L — ABNORMAL LOW (ref 98–111)
Creatinine, Ser: 5.21 mg/dL — ABNORMAL HIGH (ref 0.61–1.24)
GFR, Estimated: 12 mL/min — ABNORMAL LOW (ref >60)
Glucose, Bld: 249 mg/dL — ABNORMAL HIGH (ref 70–99)
Potassium: 3.5 mmol/L (ref 3.5–5.1)
Sodium: 133 mmol/L — ABNORMAL LOW (ref 135–145)
Total Bilirubin: 0.4 mg/dL (ref 0.3–1.2)
Total Protein: 7.1 g/dL (ref 6.5–8.1)

## 2023-07-17 LAB — CBC WITH DIFFERENTIAL/PLATELET
Abs Immature Granulocytes: 0.05 10*3/uL (ref 0.00–0.07)
Basophils Absolute: 0 10*3/uL (ref 0.0–0.1)
Basophils Relative: 0 %
Eosinophils Absolute: 0.1 10*3/uL (ref 0.0–0.5)
Eosinophils Relative: 2 %
HCT: 31.7 % — ABNORMAL LOW (ref 39.0–52.0)
Hemoglobin: 9.5 g/dL — ABNORMAL LOW (ref 13.0–17.0)
Immature Granulocytes: 1 %
Lymphocytes Relative: 9 %
Lymphs Abs: 0.5 10*3/uL — ABNORMAL LOW (ref 0.7–4.0)
MCH: 26.2 pg (ref 26.0–34.0)
MCHC: 30 g/dL (ref 30.0–36.0)
MCV: 87.6 fL (ref 80.0–100.0)
Monocytes Absolute: 0.5 10*3/uL (ref 0.1–1.0)
Monocytes Relative: 9 %
Neutro Abs: 4.7 10*3/uL (ref 1.7–7.7)
Neutrophils Relative %: 79 %
Platelets: 111 10*3/uL — ABNORMAL LOW (ref 150–400)
RBC: 3.62 MIL/uL — ABNORMAL LOW (ref 4.22–5.81)
RDW: 16.4 % — ABNORMAL HIGH (ref 11.5–15.5)
WBC: 6 10*3/uL (ref 4.0–10.5)
nRBC: 0 % (ref 0.0–0.2)

## 2023-07-17 LAB — MAGNESIUM: Magnesium: 1.2 mg/dL — ABNORMAL LOW (ref 1.7–2.4)

## 2023-07-17 LAB — GLUCOSE, CAPILLARY: Glucose-Capillary: 233 mg/dL — ABNORMAL HIGH (ref 70–99)

## 2023-07-17 LAB — I-STAT CG4 LACTIC ACID, ED: Lactic Acid, Venous: 1.1 mmol/L (ref 0.5–1.9)

## 2023-07-17 LAB — PROTIME-INR
INR: 1.1 (ref 0.8–1.2)
Prothrombin Time: 14.4 seconds (ref 11.4–15.2)

## 2023-07-17 MED ORDER — TORSEMIDE 20 MG PO TABS
120.0000 mg | ORAL_TABLET | Freq: Two times a day (BID) | ORAL | Status: DC
  Administered 2023-07-18 – 2023-07-23 (×10): 120 mg via ORAL
  Filled 2023-07-17 (×10): qty 6

## 2023-07-17 MED ORDER — CLOPIDOGREL BISULFATE 75 MG PO TABS
75.0000 mg | ORAL_TABLET | Freq: Every day | ORAL | Status: DC
  Administered 2023-07-18 – 2023-07-23 (×6): 75 mg via ORAL
  Filled 2023-07-17 (×6): qty 1

## 2023-07-17 MED ORDER — CARVEDILOL 12.5 MG PO TABS
12.5000 mg | ORAL_TABLET | Freq: Two times a day (BID) | ORAL | Status: DC
  Administered 2023-07-18 – 2023-07-23 (×9): 12.5 mg via ORAL
  Filled 2023-07-17 (×9): qty 1

## 2023-07-17 MED ORDER — INSULIN ASPART 100 UNIT/ML IJ SOLN
0.0000 [IU] | Freq: Three times a day (TID) | INTRAMUSCULAR | Status: DC
  Administered 2023-07-18: 3 [IU] via SUBCUTANEOUS

## 2023-07-17 MED ORDER — LORAZEPAM 1 MG PO TABS
1.0000 mg | ORAL_TABLET | Freq: Once | ORAL | Status: AC
  Administered 2023-07-17: 1 mg via ORAL
  Filled 2023-07-17: qty 1

## 2023-07-17 MED ORDER — AMLODIPINE BESYLATE 10 MG PO TABS
10.0000 mg | ORAL_TABLET | Freq: Every morning | ORAL | Status: DC
  Administered 2023-07-18 – 2023-07-23 (×6): 10 mg via ORAL
  Filled 2023-07-17 (×6): qty 1

## 2023-07-17 MED ORDER — CALCIUM ACETATE (PHOS BINDER) 667 MG PO CAPS
1334.0000 mg | ORAL_CAPSULE | Freq: Three times a day (TID) | ORAL | Status: DC
  Administered 2023-07-18 – 2023-07-23 (×13): 1334 mg via ORAL
  Filled 2023-07-17 (×17): qty 2

## 2023-07-17 MED ORDER — HEPARIN SODIUM (PORCINE) 5000 UNIT/ML IJ SOLN
5000.0000 [IU] | Freq: Three times a day (TID) | INTRAMUSCULAR | Status: DC
  Administered 2023-07-17 – 2023-07-23 (×16): 5000 [IU] via SUBCUTANEOUS
  Filled 2023-07-17 (×15): qty 1

## 2023-07-17 MED ORDER — ASPIRIN 81 MG PO TBEC
81.0000 mg | DELAYED_RELEASE_TABLET | Freq: Every day | ORAL | Status: DC
  Administered 2023-07-18 – 2023-07-23 (×6): 81 mg via ORAL
  Filled 2023-07-17 (×6): qty 1

## 2023-07-17 MED ORDER — IOHEXOL 350 MG/ML SOLN
75.0000 mL | Freq: Once | INTRAVENOUS | Status: AC | PRN
  Administered 2023-07-17: 75 mL via INTRAVENOUS

## 2023-07-17 MED ORDER — TRAZODONE HCL 50 MG PO TABS
150.0000 mg | ORAL_TABLET | Freq: Every day | ORAL | Status: DC
  Administered 2023-07-18 – 2023-07-22 (×6): 150 mg via ORAL
  Filled 2023-07-17 (×2): qty 1
  Filled 2023-07-17: qty 3
  Filled 2023-07-17: qty 1
  Filled 2023-07-17: qty 3
  Filled 2023-07-17: qty 1
  Filled 2023-07-17: qty 3

## 2023-07-17 MED ORDER — HYDROMORPHONE HCL 1 MG/ML IJ SOLN
1.0000 mg | Freq: Once | INTRAMUSCULAR | Status: AC
  Administered 2023-07-17: 1 mg via INTRAVENOUS
  Filled 2023-07-17: qty 1

## 2023-07-17 MED ORDER — INSULIN GLARGINE-YFGN 100 UNIT/ML ~~LOC~~ SOLN
20.0000 [IU] | Freq: Every day | SUBCUTANEOUS | Status: DC
  Administered 2023-07-17 – 2023-07-19 (×3): 20 [IU] via SUBCUTANEOUS
  Filled 2023-07-17 (×4): qty 0.2

## 2023-07-17 MED ORDER — DIAZEPAM 5 MG PO TABS
5.0000 mg | ORAL_TABLET | Freq: Two times a day (BID) | ORAL | Status: DC | PRN
  Administered 2023-07-20: 5 mg via ORAL
  Filled 2023-07-17: qty 1

## 2023-07-17 MED ORDER — PREGABALIN 75 MG PO CAPS
75.0000 mg | ORAL_CAPSULE | Freq: Every day | ORAL | Status: DC
  Administered 2023-07-18 – 2023-07-23 (×6): 75 mg via ORAL
  Filled 2023-07-17 (×6): qty 1

## 2023-07-17 MED ORDER — PANTOPRAZOLE SODIUM 40 MG PO TBEC
40.0000 mg | DELAYED_RELEASE_TABLET | Freq: Every day | ORAL | Status: DC
  Administered 2023-07-18 – 2023-07-23 (×5): 40 mg via ORAL
  Filled 2023-07-17 (×5): qty 1

## 2023-07-17 MED ORDER — OXYCODONE HCL 5 MG PO TABS: 5.0000 mg | ORAL_TABLET | Freq: Once | ORAL | Status: DC

## 2023-07-17 MED ORDER — MAGNESIUM OXIDE -MG SUPPLEMENT 400 (240 MG) MG PO TABS
800.0000 mg | ORAL_TABLET | Freq: Once | ORAL | Status: AC
  Administered 2023-07-17: 800 mg via ORAL
  Filled 2023-07-17: qty 2

## 2023-07-17 MED ORDER — ACETAMINOPHEN 650 MG RE SUPP: 650.0000 mg | Freq: Four times a day (QID) | RECTAL | Status: DC | PRN

## 2023-07-17 MED ORDER — TAMSULOSIN HCL 0.4 MG PO CAPS
0.4000 mg | ORAL_CAPSULE | Freq: Every day | ORAL | Status: DC
  Administered 2023-07-18 – 2023-07-23 (×7): 0.4 mg via ORAL
  Filled 2023-07-17 (×7): qty 1

## 2023-07-17 MED ORDER — ATORVASTATIN CALCIUM 40 MG PO TABS
40.0000 mg | ORAL_TABLET | Freq: Every day | ORAL | Status: DC
  Administered 2023-07-18 – 2023-07-23 (×6): 40 mg via ORAL
  Filled 2023-07-17 (×6): qty 1

## 2023-07-17 MED ORDER — ACETAMINOPHEN 325 MG PO TABS: 650.0000 mg | ORAL_TABLET | Freq: Four times a day (QID) | ORAL | Status: DC | PRN

## 2023-07-17 MED ORDER — NORTRIPTYLINE HCL 10 MG PO CAPS
20.0000 mg | ORAL_CAPSULE | Freq: Every day | ORAL | Status: DC
  Administered 2023-07-18 – 2023-07-22 (×6): 20 mg via ORAL
  Filled 2023-07-17 (×7): qty 2

## 2023-07-17 MED ORDER — HYDROMORPHONE HCL 1 MG/ML IJ SOLN
0.5000 mg | INTRAMUSCULAR | Status: DC | PRN
  Administered 2023-07-17 – 2023-07-18 (×4): 0.5 mg via INTRAVENOUS
  Filled 2023-07-17 (×4): qty 0.5

## 2023-07-17 MED ORDER — ACETAMINOPHEN 325 MG PO TABS
650.0000 mg | ORAL_TABLET | Freq: Four times a day (QID) | ORAL | Status: DC
  Administered 2023-07-17 – 2023-07-23 (×15): 650 mg via ORAL
  Filled 2023-07-17 (×17): qty 2

## 2023-07-17 MED ORDER — METOLAZONE 5 MG PO TABS
10.0000 mg | ORAL_TABLET | Freq: Every day | ORAL | Status: DC
  Administered 2023-07-18 – 2023-07-21 (×4): 10 mg via ORAL
  Filled 2023-07-17 (×5): qty 2

## 2023-07-17 MED ORDER — SERTRALINE HCL 100 MG PO TABS
100.0000 mg | ORAL_TABLET | Freq: Every day | ORAL | Status: DC
  Administered 2023-07-18 – 2023-07-23 (×6): 100 mg via ORAL
  Filled 2023-07-17 (×6): qty 1

## 2023-07-17 NOTE — ED Provider Triage Note (Signed)
Emergency Medicine Provider Triage Evaluation Note  Jeffrey [name] , a 62 y.o. male  was evaluated in triage.  Pt complains of left arm pain going on for last couple days constant worse today states pain is all over his arm, denies any paresthesias but states he is only able to move it due to pain, denies any trauma to the area, states never had this the past got his dialysis treatment on Saturday, does note some slight shortness of breath and chest pain.  Review of Systems  Positive: Left arm pain, shortness of breath Negative: Nausea vomiting  Physical Exam  Ht 5\' 5"  (1.651 m)   BMI 50.09 kg/m  Gen:   Awake, no distress   Resp:  Normal effort  MSK:   Moves extremities without difficulty  Other:  Patient has left AC fistula, good palpable thrill no evidence of infection, he has brisk 1+ distal radial pulses, decent capillary refill, and is warm to the touch, is able to wiggle his fingers but because significant amount of pain   Medical Decision Making  Medically screening exam initiated at 6:27 AM.  Appropriate orders placed.  Jeffrey [name] was informed that the remainder of the evaluation will be completed by another provider, this initial triage assessment does not replace that evaluation, and the importance of remaining in the ED until their evaluation is complete.  Lab workup and imaging have been ordered will need further workup.   Carroll Sage, PA-C 07/17/23 [phone]

## 2023-07-17 NOTE — Assessment & Plan Note (Addendum)
T2DM on 70-125 units regular insulin BID with meals at home. CMP with Glucose of 249.  - 20u semglee, adjust as indicated  - very sensitive sliding scale, likely to need more resistant scale, will adjust as necessary - CBG monitoring

## 2023-07-17 NOTE — Hospital Course (Addendum)
Jeffrey [name] is a 62 y.o.male with a history of ESRD on dialysis, T2DM, PVD, CHF, HTN, chronic hypoxic respiratory failure, PTSD who was admitted to the [facility] Teaching [facility] for left upper extremity pain. His hospital course is detailed below:  LUE Pain Presented with left arm pain after his dialysis appointment. States it radiated from his fingertips to his shoulder with pt unable to move his arm due to the pain.  Very TTP on exam. Pt had a CT angiogram of his LUE that was non-diagnostic with potential narrowing of the venous anastomosis of the otherwise patent left upper arm AV dialysis graft and a minimal amount of perigraft stranding without sizable hematoma or definable/drainable fluid collection.  Pain became more severe and required 1 mg Dilaudid Q3h.  Pt had TDC placement 8/20 by vascular surgery and avoided use of LUE fistula to allow it to rest.  No concern for steal syndrome or compartment syndrome and pain gradually improved, thought to be due to infiltration or injury of LUE nerve during dialysis.  Patient decreased to oxycodone 5 mg Q4h PRN with scheduled Tylenol and ***pain remained well controlled.  Discharged on ***pain regimen.  ESRD on dialysis T/Th/Sat patient, but was unable to use L arm fistula due to pain.  TDC was placed on 8/20 and had dialysis afterwards with poor flow.  Repeat HD 8/21 dialyzed 3 L successfully.  Patient continued on T/Th/Sat schedule at discharge***.  R eye blurred vision Patient had new onset vision blurriness and field deficit with associated pain in R eye.  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  Ophtho saw patient and signed off with outpatient follow up, suspected vision changes related to hyperglycemia.  Continued to ***  T2DM  Pt was given 20U long acting and sensitive sliding scale in the hospital, but after being started on prednisone had multiple CBGs in 400-600 range.   Insulin was titrated up to resistant sliding scale, 45U BID LA, and 10U AC coverage.  CBGs improved to *** range by discharge and patient sent home on ***.  OSA Patient not using CPAP at home consistently, maybe twice a month.  Became somewhat somnolent on 8/21 with sharply elevated glucose after HD, with VBG indicating hypercarbia and pH of 7.28.  Patient briefly placed on BiPAP and improved.  Then prescribed CPAP nightly without further episodes of somnolence.  Did continually require home 3 L of oxygen for chronic hypoxic respiratory failure.  Other chronic conditions were medically managed with home medications and formulary alternatives as necessary (PVD, CHF, HTN, PTSD, chronic back and phantom limb pain, chronic hypoxic resp failure).  PCP Follow-up Recommendations: Ophtho follow up for diabetic eye exam, possible cataracts Recommend gradually weaning Valium in favor of other psychiatric medications given adverse effects of BZAs

## 2023-07-17 NOTE — Progress Notes (Signed)
VASCULAR LAB    Left upper extremity dialysis acces duplex has been performed.  See CV proc for preliminary results.  Messaged Dr. Jacqulyn Bath with results via secure chat  Rosezetta Schlatter, [facility], RVT 07/17/2023, 8:53 AM

## 2023-07-17 NOTE — ED Triage Notes (Signed)
Pt arrived from home via [facility] w c/o left sided pain 10/10. Pt in tears in triage.

## 2023-07-17 NOTE — Assessment & Plan Note (Addendum)
-   T/Th/Sat dialysis patient - will coordinate dialysis tomorrow (8/20) with nephrology - RFP

## 2023-07-17 NOTE — Consult Note (Addendum)
Hospital Consult    Reason for Consult:  left arm pain Requesting Physician:  ED MRN #:  [id number]  History of Present Illness: Jeffrey [name] is a 62 y.o. male with a PMH of CHF, COPD, HTN, DMII, MI, and ESRD on dialysis T/Th/Sat who presented to the ED today with left arm pain. The patient states he had dialysis via his left arm graft on Saturday. He feels as if his graft was stuck wrong. A few hours after his dialysis session ended he started having "searing" left arm pain extending from his trapezius to the tips of his fingers. This pain has been worsening with time. He has a difficult time moving his arm due to the pain.   Past Medical History:  Diagnosis Date   Altered mental status 05/04/2021   Arthritis    Blood transfusion without reported diagnosis    CHF (congestive heart failure) (HCC)    COPD (chronic obstructive pulmonary disease) (HCC)    Depression    Diabetes mellitus without complication (HCC)    type 2   Dyspnea    ESRD on hemodialysis (HCC)    Tues Thurs Sat   GERD (gastroesophageal reflux disease)    protonix   Heart murmur    as a child, no problems   Hypertension    Myocardial infarction (HCC)    PTSD (post-traumatic stress disorder)    Sepsis (HCC)    Sleep apnea    occasional uses CPAP   Uses powered wheelchair     Past Surgical History:  Procedure Laterality Date   A/V FISTULAGRAM N/A 08/30/2021   Procedure: A/V FISTULAGRAM;  Surgeon: Maeola Harman, MD;  Location[facility] INVASIVE CV LAB;  Service: Cardiovascular;  Laterality: N/A;   A/V FISTULAGRAM Right 07/28/2022   Procedure: A/V Fistulagram;  Surgeon: Maeola Harman, MD;  Location: [facility] INVASIVE CV LAB;  Service: Cardiovascular;  Laterality: Right;   A/V FISTULAGRAM Right 09/05/2022   Procedure: A/V Fistulagram;  Surgeon: Maeola Harman, MD;  Location: [facility] INVASIVE CV LAB;  Service: Cardiovascular;  Laterality: Right;   A/V FISTULAGRAM Right 10/31/2022   Procedure: A/V  Fistulagram;  Surgeon: Maeola Harman, MD;  Location: [facility] INVASIVE CV LAB;  Service: Cardiovascular;  Laterality: Right;   A/V SHUNT INTERVENTION Right 10/31/2022   Procedure: A/V SHUNT INTERVENTION;  Surgeon: Maeola Harman, MD;  Location: [facility] INVASIVE CV LAB;  Service: Cardiovascular;  Laterality: Right;   A/V SHUNTOGRAM N/A 11/08/2021   Procedure: A/V SHUNTOGRAM;  Surgeon: Maeola Harman, MD;  Location: [facility] INVASIVE CV LAB;  Service: Cardiovascular;  Laterality: N/A;   AMPUTATION Left 04/03/2021   Procedure: LEFT ABOVE-THE-KNEE AMPUTATION;  Surgeon: Terance Hart, MD;  Location: [facility] OR;  Service: Orthopedics;  Laterality: Left;   AV FISTULA PLACEMENT Right 08/12/2021   Procedure: INSERTION OF ARTERIOVENOUS (AV) GORE-TEX GRAFT RIGHT ARM;  Surgeon: Nada Libman, MD;  Location: [facility] OR;  Service: Vascular;  Laterality: Right;   AV FISTULA PLACEMENT Left 04/07/2023   Procedure: ARTERIOVENOUS (AV) FISTULA GRAFT USING GORETEX STRETCH (4-7MM);  Surgeon: Chuck Hint, MD;  Location: [facility] OR;  Service: Vascular;  Laterality: Left;   INSERTION OF DIALYSIS CATHETER N/A 11/25/2022   Procedure: INSERTION OF DIALYSIS CATHETER;  Surgeon: Lorin Glass, MD;  Location: [facility] ENDOSCOPY;  Service: Pulmonary;  Laterality: N/A;   INSERTION OF DIALYSIS CATHETER N/A 03/03/2023   Procedure: INSERTION OF TUNNELED DIALYSIS CATHETER;  Surgeon: Maeola Harman, MD;  Location: [facility] OR;  Service: Vascular;  Laterality: N/A;   INSERTION OF DIALYSIS CATHETER Left 04/07/2023   Procedure: INSERTION OF DIALYSIS CATHETER USING PALINDROME  28CM PRECISION CHRONIC CATHETER KIT;  Surgeon: Chuck Hint, MD;  Location: [facility] OR;  Service: Vascular;  Laterality: Left;   IR REMOVAL TUN CV CATH W/O FL  03/30/2021   JOINT REPLACEMENT     Bilateral knees   LIGATION ARTERIOVENOUS GORTEX GRAFT Right 03/03/2023   Procedure: LIGATION RIGHT ARM ARTERIOVENOUS GORTEX GRAFT;  Surgeon: Maeola Harman, MD;  Location: [facility] OR;  Service: Vascular;  Laterality: Right;   PERIPHERAL VASCULAR BALLOON ANGIOPLASTY Right 08/30/2021   Procedure: PERIPHERAL VASCULAR BALLOON ANGIOPLASTY;  Surgeon: Maeola Harman, MD;  Location: [facility] INVASIVE CV LAB;  Service: Cardiovascular;  Laterality: Right;   PERIPHERAL VASCULAR BALLOON ANGIOPLASTY Right 07/28/2022   Procedure: PERIPHERAL VASCULAR BALLOON ANGIOPLASTY;  Surgeon: Maeola Harman, MD;  Location: [facility] INVASIVE CV LAB;  Service: Cardiovascular;  Laterality: Right;  AVF   PERIPHERAL VASCULAR INTERVENTION  11/08/2021   Procedure: PERIPHERAL VASCULAR INTERVENTION;  Surgeon: Maeola Harman, MD;  Location: [facility] INVASIVE CV LAB;  Service: Cardiovascular;;  Central rt arm fistula   UPPER EXTREMITY VENOGRAPHY Right 08/10/2021   Procedure: UPPER EXTREMITY VENOGRAPHY;  Surgeon: Nada Libman, MD;  Location: [facility] INVASIVE CV LAB;  Service: Cardiovascular;  Laterality: Right;    Allergies  Allergen Reactions   Actos [Pioglitazone] Anaphylaxis and Rash   Dexmedetomidine Nausea And Vomiting and Other (See Comments)    (Precedex) Dose-limiting bradycardia     Ibuprofen Shortness Of Breath, Swelling and Other (See Comments)    Pt tolerates aspirin    Tomato Anaphylaxis and Other (See Comments)    Only allergic to RAW tomatoes   Wellbutrin [Bupropion] Swelling    Prior to Admission medications   Medication Sig Start Date End Date Taking? Authorizing Provider  amLODipine (NORVASC) 10 MG tablet Take 10 mg by mouth every morning. 04/17/20   [provider]  ASPIRIN LOW DOSE 81 MG EC tablet Take 81 mg by mouth in the morning. 02/22/21   [provider]  atorvastatin (LIPITOR) 40 MG tablet Take 40 mg by mouth in the morning. 02/09/21   [provider]  calcium acetate (PHOSLO) 667 MG capsule Take 2 capsules (1,334 mg total) by mouth with breakfast, with lunch, and with evening meal. 04/10/21   Calvert Cantor, MD   Calcium Carbonate Antacid (CALCIUM CARBONATE, DOSED IN MG ELEMENTAL CALCIUM,) 1250 MG/5ML SUSP Take 5 mLs (500 mg of elemental calcium total) by mouth every 6 (six) hours as needed for indigestion. 04/08/23   Rhetta Mura, MD  carvedilol (COREG) 12.5 MG tablet Take 12.5 mg by mouth 2 (two) times daily with a meal. 04/17/20   [provider]  Cholecalciferol (VITAMIN D) 50 MCG (2000 UT) CAPS Take 2,000 Units by mouth daily.    [provider]  clopidogrel (PLAVIX) 75 MG tablet Take 1 tablet (75 mg total) by mouth daily. 11/02/22   Rhyne, Ames Coupe, PA-C  cyclobenzaprine (FLEXERIL) 10 MG tablet Take 10 mg by mouth 3 (three) times daily as needed for muscle spasms. 12/29/21   [provider]  diazepam (VALIUM) 5 MG tablet Take 5 mg by mouth 2 (two) times daily as needed for muscle spasms. 12/09/21   [provider]  insulin regular human CONCENTRATED (HUMULIN R U-500 KWIKPEN) 500 UNIT/ML KwikPen Inject 45 Units into the skin daily with supper. Patient taking differently: Inject 70-125 Units into the skin 2 (two) times daily  with a meal. 09/04/21 08/30/24  [name], MD  metolazone (ZAROXOLYN) 10 MG tablet Take 10 mg by mouth daily. 07/13/21   [provider]  nortriptyline (PAMELOR) 10 MG capsule Take 20 mg by mouth at bedtime. 10/23/19 08/30/24  [provider]  pantoprazole (PROTONIX) 40 MG tablet Take 40 mg by mouth daily before breakfast. 02/09/21   [provider]  pregabalin (LYRICA) 75 MG capsule Take 1 capsule (75 mg total) by mouth See admin instructions. Daily.  Give after dialysis on dialysis days. 01/21/23   [name], MD  sertraline (ZOLOFT) 100 MG tablet Take 100 mg by mouth in the morning. 02/22/21   [provider]  tamsulosin (FLOMAX) 0.4 MG CAPS capsule Take 1 capsule (0.4 mg total) by mouth daily. 11/10/21   [name], MD  torsemide (DEMADEX) 20 MG tablet Take 120 mg by mouth 2 (two) times daily.     [provider]  traZODone (DESYREL) 150 MG tablet Take 150 mg by mouth at bedtime. 12/09/21   [provider]    Social History   Socioeconomic History   Marital status: Significant Other    Spouse name: Not on file   Number of children: Not on file   Years of education: Not on file   Highest education level: Not on file  Occupational History   Occupation: disabled  Tobacco Use   Smoking status: Former    Current packs/day: 0.00    Types: Cigarettes    Quit date: 2012    Years since quitting: 12.6   Smokeless tobacco: Never  Vaping Use   Vaping status: Never Used  Substance and Sexual Activity   Alcohol use: Yes    Comment: occasionally   Drug use: Not Currently    Comment: Years ago   Sexual activity: Not on file  Other Topics Concern   Not on file  Social History Narrative   Not on file   Social Determinants of Health   Financial Resource Strain: Medium Risk (09/05/2022)   Received from [facility], [facility]   Overall Financial Resource Strain (CARDIA)    Difficulty of Paying Living Expenses: Somewhat hard  Food Insecurity: No Food Insecurity (06/01/2023)   Hunger Vital Sign    Worried About Running Out of Food in the Last Year: Never true    Ran Out of Food in the Last Year: Never true  Transportation Needs: No Transportation Needs (06/01/2023)   PRAPARE - Administrator, Civil Service (Medical): No    Lack of Transportation (Non-Medical): No  Physical Activity: Inactive (09/08/2021)   Received from [facility], [facility]   Exercise Vital Sign    Days of Exercise per Week: 0 days    Minutes of Exercise per Session: 0 min  Stress: Stress Concern Present (09/07/2022)   Received from [facility], [facility]   [employer] of Occupational Health - Occupational Stress Questionnaire    Feeling of Stress : Rather much   Social Connections: Moderately Isolated (09/07/2022)   Received from [facility], [facility]   Social Connection and Isolation Panel [NHANES]    Frequency of Communication with Friends and Family: More than three times a week    Frequency of Social Gatherings with Friends and Family: Once a week    Attends Religious Services: 1 to 4 times per year    Active Member of [employer]  or Organizations: No    Attends Banker Meetings: Never    Marital Status: Widowed  Intimate Partner Violence: Not At Risk (06/01/2023)   Humiliation, Afraid, Rape, and Kick questionnaire    Fear of Current or Ex-Partner: No    Emotionally Abused: No    Physically Abused: No    Sexually Abused: No     History reviewed. No pertinent family history.  ROS: Otherwise negative unless mentioned in HPI  Physical Examination  Vitals:   07/17/23 1524  BP: (!) 192/63  Pulse: 98  Resp: 20  Temp: 98.2 F (36.8 C)  SpO2: 98%   Body mass index is 50.09 kg/m.  General:  WDWN in NAD Gait: Not observed HENT: WNL, normocephalic Pulmonary: normal non-labored breathing, on nasal cannula Cardiac: regular Abdomen:  soft, NT/ND Skin: without rashes Vascular Exam/Pulses: 2+ left radial pulse Extremities: left upper arm AVG with great thrill on palpation. Hematoma surrounding graft with exquisite tenderness Musculoskeletal: no muscle wasting or atrophy  Neurologic: A&O X 3;  intact motor and sensation of LUE but painful with movement Psychiatric:  The pt has Normal affect. Lymph:  Unremarkable    CBC    Component Value Date/Time   WBC 6.0 07/17/2023 0830   RBC 3.62 (L) 07/17/2023 0830   HGB 9.5 (L) 07/17/2023 0830   HCT 31.7 (L) 07/17/2023 0830   PLT 111 (L) 07/17/2023 0830   MCV 87.6 07/17/2023 0830   MCH 26.2 07/17/2023 0830   MCHC 30.0 07/17/2023 0830   RDW 16.4 (H) 07/17/2023 0830   LYMPHSABS 0.5 (L) 07/17/2023 0830   MONOABS 0.5 07/17/2023 0830   EOSABS  0.1 07/17/2023 0830   BASOSABS 0.0 07/17/2023 0830    BMET    Component Value Date/Time   NA 133 (L) 07/17/2023 0830   K 3.5 07/17/2023 0830   CL 89 (L) 07/17/2023 0830   CO2 30 07/17/2023 0830   GLUCOSE 249 (H) 07/17/2023 0830   BUN 78 (H) 07/17/2023 0830   CREATININE 5.21 (H) 07/17/2023 0830   CALCIUM 7.1 (L) 07/17/2023 0830   GFRNONAA 12 (L) 07/17/2023 0830    COAGS: Lab Results  Component Value Date   INR 1.1 07/17/2023   INR 1.1 10/19/2022   INR 1.2 03/28/2021     Non-Invasive Vascular Imaging:   LUE Duplex: Graft has normal flow. Fluid noted in the left upper arm not surrounding the graft   ASSESSMENT/PLAN: This is a 62 y.o. male with left arm pain   -The patient has a history of left arm AVG and left internal jugular TDC placement by Dr.Dickson on May 10th. His left arm graft is fully functioning and his [facility] has been removed in the interim -He presents to the ED with left arm pain that started a few hours after his dialysis session on Saturday. This pain has been worsening since it began. He states it extends from his trapezius muscle to his fingertips. -On exam his left upper arm graft has a great thrill. There is hematoma in the upper arm, likely due to infiltration event on Saturday. Potentially this event has caused some of his arm pain however I am not sure if its the primary source. His pain may be neurologic in nature -I do not have any concern for steal given that his left hand is warm and he has a palpable radial pulse -At this time I do not think the patient's graft is infected. He has no fevers, chills, or fluctuance to the arm -  The patient should rest his left arm AVG until his pain and hematoma resolves. We will plan for [facility] placement in the OR tomorrow with Dr.Dickson. NPO at midnight. Consent orders placed  Loel Dubonnet, PA-C [facility] [phone]   I have seen and evaluated the patient. I agree with the PA note as documented  above.  62 year old male with end-stage renal disease that vascular surgery was asked to evaluate left arm pain with functioning left upper arm AV graft.  Patient states his pain started acutely on Saturday.  It goes from the shoulder all the way into the hand.  The graft has an excellent thrill on exam.  He has palpable radial pulse at the wrist and the hand looks well-perfused.  I do not see any tissue loss.  This is not a classic steal presentation.  I do not see any fluid around the graft on CT to suggest infection and on exam there is no fluctuance or erythema.  He is refusing to have the graft accessed for dialysis.  I will post him for [facility] tomorrow with Dr. Edilia Bo and we can rest his arm but I am unclear about the etiology for this.  He is screaming in pain in the ED when the arm is examined.  Please keep n.p.o. after midnight.  Cephus Shelling, MD [facility] [place] Office: [phone]

## 2023-07-17 NOTE — H&P (Cosign Needed Addendum)
Hospital Admission History and Physical Service Pager: [phone]  Patient name: Jeffrey [name] Medical record number: [id number] Date of Birth: 06/04/1961 Age: 62 y.o. Gender: male  Primary Care Provider: Knox Royalty, MD Consultants: vascular surgery Code Status: FULL  Chief Complaint: LUE pain  Assessment and Plan: Jeffrey [name] is a 62 y.o. male presenting with LUE pain after receiving dialysis. Differential for presentation of this includes AVG infiltration, LUE arterial/venous thrombosis, AVG thrombosis, steal syndrome, graft site infection. Suspect pt's LUE pain could be 2/2 infiltration event at dialysis on Saturday, as the symptoms began shortly after receiving dialysis. CTA of LUE in the ED did show a minimal amount of perigraft stranding without sizable hematoma or definable/drainable fluid collection, no indication of thrombus however the exam was limited 2/2 pain. Low suspicion for steal syndrome as pt has 2+ radial pulses in LUE. Pt has not had fevers, chills, no signs of infection at AVG site. Pt did not have significantly greater swelling in his LUE than RUE making my suspicion for lymphedema less likely. Vascular will place a [facility] tomorrow and allow pt's arm to rest, I suspect that his symptoms will improve with this plan.   Assessment & Plan Left arm pain Left upper extremity pain x2 days after receiving dialysis. CT angiogram with a small amount of perigraft stranding, I suspect this to be the source of pt's pain. Vascular to place [facility] tomorrow and allow pt's AVG to rest. Low suspicion for steal syndrome as pt has great pulses in LUE.  Laureen Abrahams, inpatient status - [facility] placement 8/20 with vascular - vascular is following, appreciate their assistance   - scheduled Tylenol - dilaudid 0.5mg  PRN for pain - neurovascular checks q4h ESRD on dialysis [facility]) - T/Th/Sat dialysis patient - will coordinate dialysis tomorrow (8/20) with nephrology - RFP Type 2 diabetes mellitus  with hyperlipidemia (HCC) T2DM on 70-125 units regular insulin BID with meals at home. CMP with Glucose of 249.  - 20u semglee, adjust as indicated  - very sensitive sliding scale, likely to need more resistant scale, will adjust as necessary - CBG monitoring  Chronic and Stable Problems:  PVD - ASA, Plavix, Lipitor  CHF - Continue Coreg, torsemide, metolazone at home doses  Chronic Hypoxic Respiratory Failure - on home 3L HTN - Continue home amlodipine and coreg  PTSD - Continue home Trazodone, Valium  Chronic Pain - Lyrica after HD, Nortriptyline at bedtime, Valium PRN    FEN/GI: regular diet, NPO after midnight VTE Prophylaxis: heparin  Disposition: med-surg  History of Present Illness:  Jeffrey [name] is a 62 y.o. male presenting with left arm pain x 2 days.  Patient states that he had dialysis on Saturday and when he got home he began experiencing excruciating left arm pain.  States the pain started in his fingertips and radiated up to his shoulder.  Denies radiation to his chest.  States that he is unable to move his arms and fingers due to his pain. He also notes swelling to his LUE that caused him to have to remove his wedding band. Patient states that he feels like his graft was stuck incorrectly. Pt denies chest pain, SOB, or fever.   In the ED, Pt had a CT angiogram of his LUE that was non-diagnostic, showing potential narrowing of the venous anastomosis of the otherwise patent left upper arm AV dialysis graft, however the exam was limited due to pt's pain. Vascular surgery evaluated pt in the ED and  plan to place a [facility] tomorrow, which will allow pt to rest his arm AVG until the pain and hematoma resolves.   Review Of Systems: Per HPI with the following additions: none  Pertinent Past Medical History: CHF, COPD, T2DM, HTN, MI, PTSD, ESRD on dialysis (T/Th/Sat) Remainder reviewed in history tab.   Pertinent Past Surgical History: AV fistulagram, AV shuntogram, L AKA   Remainder reviewed in history tab.  Pertinent Social History: Tobacco use: former, quit 2012 Alcohol use: occasionally Other Substance use: none Lives with wife  Pertinent Family History: none  Remainder reviewed in history tab.   Important Outpatient Medications: Insulin regular - 70-125 units BID with meals Plavix 75mg  every day Coreg 12.5mg  BID Metolazone 10mg  daily, torsemide 20mg  BID Remainder reviewed in medication history.   Objective: BP (!) 153/81 (BP Location: Right Arm)   Pulse (!) 102   Temp 99.6 F (37.6 C) (Oral)   Resp 18   Ht 5\' 5"  (1.651 m)   SpO2 98%   BMI 50.09 kg/m  Exam: General: no acute distress, chronically ill appearing ENTM: moist mucous membranes Cardiovascular: RRR, no murmurs Respiratory: no respiratory distress, CTAB, on 3L [place] Gastrointestinal: Soft, non-distended MSK: LUE with hematoma and erythema surrounding AVG. Swelling noted to LUE with indentation of wedding band. Diffuse TTP to entire LUE. Warmth to LUE except for distal half of left hand which was cool to touch. 2+ radial pulse LUE. RUE no swelling, erythema, ecchymosis, or pain. L AKA.  Neuro: A&O x3 Psych: Normal affect  Labs:  CBC BMET  Recent Labs  Lab 07/17/23 0830  WBC 6.0  HGB 9.5*  HCT 31.7*  PLT 111*   Recent Labs  Lab 07/17/23 0830  NA 133*  K 3.5  CL 89*  CO2 30  BUN 78*  CREATININE 5.21*  GLUCOSE 249*  CALCIUM 7.1*    Pertinent additional labs PT/INR WNL, glucose 249.  EKG: NSR, slight lateral J point elevation previously noted on EKG 5/25, QTc 482    Imaging Studies Performed:  LEFT HAND - 2 VIEW   COMPARISON:  No priors.   FINDINGS:  Two views of the left hand demonstrate no acute displaced fracture   LEFT FOREARM - 2 VIEW   COMPARISON:  None Available.   FINDINGS:  There is no evidence of fracture or other focal bone lesions.   CT ANGIO LUE IMPRESSION: 1. Degraded examination without definitive explanation for patient's acute  onset of left upper extremity pain. Specifically, the forearm vasculature is suboptimally evaluated though both the radial and ulnar arteries appear patent at the level of the mid and distal aspects of the left forearm. 2. Potential narrowing of the venous anastomosis of the otherwise patent left upper arm AV dialysis graft. Further evaluation with shuntogram could be performed as indicated.  I agree with radiologist interpretation of findings.    Everhart, Kirstie, DO 07/17/2023, 9:04 PM PGY-1, [facility]  FPTS Intern pager: [phone], text pages welcome Secure chat group [facility] Teaching Service    I have evaluated this patient along with Dr. Rexene Alberts and reviewed the above note, making necessary revisions.  Dorothyann Gibbs, MD 07/17/2023, 9:27 PM PGY-3, [facility]

## 2023-07-17 NOTE — Assessment & Plan Note (Addendum)
Left upper extremity pain x2 days after receiving dialysis. CT angiogram with a small amount of perigraft stranding, I suspect this to be the source of pt's pain. Vascular to place [facility] tomorrow and allow pt's AVG to rest. Low suspicion for steal syndrome as pt has great pulses in LUE.  Laureen Abrahams, inpatient status - [facility] placement 8/20 with vascular - vascular is following, appreciate their assistance   - scheduled Tylenol - dilaudid 0.5mg  PRN for pain - neurovascular checks q4h

## 2023-07-17 NOTE — ED Notes (Signed)
Pt was not in hallway when went to administer medication

## 2023-07-17 NOTE — ED Provider Notes (Signed)
[place] EMERGENCY DEPARTMENT AT [facility] Provider Note   CSN: [id number] Arrival date & time: 07/17/23  0554     History  Chief Complaint  Patient presents with   severe pain left side of body    Jeffrey [name] is a 62 y.o. male.  HPI    Patient is a 83yM w/ PMHx of ESRD on dialysis TTS, COPD on 3L O2 via [place] at baseline, and left AKA, presenting today for left arm pain. He states it began abruptly overnight while laying in the bed. He denies any falls or injuries. He has no pain in his neck. He denies any numbness, but describes it as a searing pain. He has no associated chest pain or shortness of breath. He is on his home oxygen. He last went to dialysis 2 days ago and his LUE graft was used without complication. He take a home tylenol prior to arrival without pain. He states he has recently run out of his oxycodone. He does not have any headache or changes in mental status. He denies any nausea or vomiting. He has not had pain like this in the past. The only thing that has helped prior to arrival was elevation of his arm.   Home Medications Prior to Admission medications   Medication Sig Start Date End Date Taking? Authorizing Provider  amLODipine (NORVASC) 10 MG tablet Take 10 mg by mouth every morning. 04/17/20   [provider]  ASPIRIN LOW DOSE 81 MG EC tablet Take 81 mg by mouth in the morning. 02/22/21   [provider]  atorvastatin (LIPITOR) 40 MG tablet Take 40 mg by mouth in the morning. 02/09/21   [provider]  calcium acetate (PHOSLO) 667 MG capsule Take 2 capsules (1,334 mg total) by mouth with breakfast, with lunch, and with evening meal. 04/10/21   Calvert Cantor, MD  Calcium Carbonate Antacid (CALCIUM CARBONATE, DOSED IN MG ELEMENTAL CALCIUM,) 1250 MG/5ML SUSP Take 5 mLs (500 mg of elemental calcium total) by mouth every 6 (six) hours as needed for indigestion. 04/08/23   Rhetta Mura, MD  carvedilol (COREG) 12.5 MG tablet  Take 12.5 mg by mouth 2 (two) times daily with a meal. 04/17/20   [provider]  Cholecalciferol (VITAMIN D) 50 MCG (2000 UT) CAPS Take 2,000 Units by mouth daily.    [provider]  clopidogrel (PLAVIX) 75 MG tablet Take 1 tablet (75 mg total) by mouth daily. 11/02/22   Rhyne, Ames Coupe, PA-C  cyclobenzaprine (FLEXERIL) 10 MG tablet Take 10 mg by mouth 3 (three) times daily as needed for muscle spasms. 12/29/21   [provider]  diazepam (VALIUM) 5 MG tablet Take 5 mg by mouth 2 (two) times daily as needed for muscle spasms. 12/09/21   [provider]  insulin regular human CONCENTRATED (HUMULIN R U-500 KWIKPEN) 500 UNIT/ML KwikPen Inject 45 Units into the skin daily with supper. Patient taking differently: Inject 70-125 Units into the skin 2 (two) times daily with a meal. 09/04/21 08/30/24  [name], MD  metolazone (ZAROXOLYN) 10 MG tablet Take 10 mg by mouth daily. 07/13/21   [provider]  nortriptyline (PAMELOR) 10 MG capsule Take 20 mg by mouth at bedtime. 10/23/19 08/30/24  [provider]  pantoprazole (PROTONIX) 40 MG tablet Take 40 mg by mouth daily before breakfast. 02/09/21   [provider]  pregabalin (LYRICA) 75 MG capsule Take 1 capsule (75 mg total) by mouth See admin instructions. Daily.  Give after dialysis on dialysis days. 01/21/23   [name], MD  sertraline (ZOLOFT) 100 MG tablet Take 100 mg by mouth in the morning. 02/22/21   [provider]  tamsulosin (FLOMAX) 0.4 MG CAPS capsule Take 1 capsule (0.4 mg total) by mouth daily. 11/10/21   [name], MD  torsemide (DEMADEX) 20 MG tablet Take 120 mg by mouth 2 (two) times daily.    [provider]  traZODone (DESYREL) 150 MG tablet Take 150 mg by mouth at bedtime. 12/09/21   [provider]      Allergies    Actos [pioglitazone], Dexmedetomidine, Ibuprofen, Tomato, and Wellbutrin [bupropion]    Review of Systems   Review of  Systems Negative except for as noted above in the HPI  Physical Exam Updated Vital Signs BP (!) 192/63   Pulse 98   Temp 98.2 F (36.8 C)   Resp 20   Ht 5\' 5"  (1.651 m)   SpO2 98%   BMI 50.09 kg/m  Physical Exam Vitals and nursing note reviewed.  Constitutional:      General: He is not in acute distress.    Appearance: He is well-developed.  HENT:     Head: Normocephalic and atraumatic.  Eyes:     Conjunctiva/sclera: Conjunctivae normal.  Neck:     Comments: No cervical spine tenderness Cardiovascular:     Rate and Rhythm: Normal rate and regular rhythm.     Heart sounds: No murmur heard. Pulmonary:     Effort: Pulmonary effort is normal. No respiratory distress.     Breath sounds: Normal breath sounds.     Comments: Saturating well on his home 3L O2 via [place] Abdominal:     Palpations: Abdomen is soft.     Tenderness: There is no abdominal tenderness. There is no guarding.  Musculoskeletal:        General: No swelling.     Cervical back: Neck supple.     Comments: 2+ radial pulses bilaterally. Exquisite tenderness noted over the left upper extremity, worsening over the forearm and hand. Limited range of motion of the forearm, wrist, and hand due to pain. Sensation intact throughout. Compartments are soft. No changes in skin color noted.   Skin:    General: Skin is warm and dry.     Capillary Refill: Capillary refill takes less than 2 seconds.  Neurological:     Mental Status: He is alert and oriented to person, place, and time.     Comments: Difficulty with grip strength on the left, more likely due to pain  Psychiatric:        Mood and Affect: Mood normal.     ED Results / Procedures / Treatments   Labs (all labs ordered are listed, but only abnormal results are displayed) Labs Reviewed  CBC WITH DIFFERENTIAL/PLATELET - Abnormal; Notable for the following components:      Result Value   RBC 3.62 (*)    Hemoglobin 9.5 (*)    HCT 31.7 (*)    RDW 16.4 (*)     Platelets 111 (*)    Lymphs Abs 0.5 (*)    All other components within normal limits  COMPREHENSIVE METABOLIC PANEL - Abnormal; Notable for the following components:   Sodium 133 (*)    Chloride 89 (*)    Glucose, Bld 249 (*)    BUN 78 (*)    Creatinine, Ser 5.21 (*)    Calcium 7.1 (*)    GFR, Estimated 12 (*)  All other components within normal limits  MAGNESIUM - Abnormal; Notable for the following components:   Magnesium 1.2 (*)    All other components within normal limits  PROTIME-INR  I-STAT CG4 LACTIC ACID, ED  I-STAT CG4 LACTIC ACID, ED    EKG None  Radiology CT ANGIO UP EXTREM LEFT W &/OR WO CONTAST  Result Date: 07/17/2023 CLINICAL DATA:  Sudden onset of severe left upper extremity pain. History of end-stage renal disease with creation of a left upper extremity AV graft on 04/22/2023. EXAM: CT ANGIOGRAPHY OF THE LEFT UPPER EXTREMITY TECHNIQUE: Multidetector CT imaging of the left upperwas performed using the standard protocol during bolus administration of intravenous contrast. Multiplanar CT image reconstructions and MIPs were obtained to evaluate the vascular anatomy. RADIATION DOSE REDUCTION: This exam was performed according to the departmental dose-optimization program which includes automated exposure control, adjustment of the mA and/or kV according to patient size and/or use of iterative reconstruction technique. CONTRAST:  75mL OMNIPAQUE IOHEXOL 350 MG/ML SOLN COMPARISON:  Left hand and forearm radiographs-earlier same day FINDINGS: Vascular Findings: Examination is significantly degraded due to patient body habitus and limited ability to appropriately position the patient for conventional imaging positioning. Thoracic aorta: No definite evidence of thoracic aortic aneurysm or dissection perivascular stranding on this nongated examination. Aortic Arch: Conventional configuration of the aortic arch. The branch vessels of the aortic arch appear patent throughout their  imaged courses. Left subclavian artery: Widely patent without a hemodynamically significant narrowing. Left axillary artery: Widely patent without a hemodynamically significant narrowing. Left brachial artery: Patent to the level of the arterial anastomosis of the left upper extremity AV graft. The left brachial artery bifurcates just distal to the arterial anastomosis. Left forearm vasculature: The left interosseous artery becomes atretic and/or occludes shortly after its origin (image 142, series 5). The origin and proximal aspect of the left ulnar artery is suboptimally evaluated, potentially due to vessel tortuosity and/or motion though the ulnar artery does appear patent at the level of the mid and distal aspects of the left forearm. The left upper arm AV graft appears patent though there is apparent smooth narrowing at the level of the venous anastomosis (image 74, series 5; coronal image 111, series 9). There is a minimal amount of perigraft stranding without sizable hematoma or definable/drainable fluid collection. _________________________________________________________ Nonvascular Findings: Post right total shoulder replacement and posterior cervical decompression, incompletely evaluated. Cardiomegaly with enlargement of the caliber of the main pulmonary artery, measuring 3.7 cm in diameter (image 76, series 5). Review of the MIP images confirms the above findings. IMPRESSION: 1. Degraded examination without definitive explanation for patient's acute onset of left upper extremity pain. Specifically, the forearm vasculature is suboptimally evaluated though both the radial and ulnar arteries appear patent at the level of the mid and distal aspects of the left forearm. 2. Potential narrowing of the venous anastomosis of the otherwise patent left upper arm AV dialysis graft. Further evaluation with shuntogram could be performed as indicated. Electronically Signed   By: Simonne Come M.D.   On: 07/17/2023 14:45    VAS [place] UPPER EXTREMITY ARTERIAL DUPLEX  Result Date: 07/17/2023  UPPER EXTREMITY DUPLEX STUDY Patient Name:  Jeffrey [name]  Date of Exam:   07/17/2023 Medical Rec #: [id number]      Accession #:    [id number] Date of Birth: 1961/11/19      Patient Gender: M Patient Age:   44 years Exam Location:  [facility] Procedure:  VAS [place] UPPER EXTREMITY ARTERIAL DUPLEX Referring Phys: Sharilyn Sites --------------------------------------------------------------------------------  Indications: Patient had dialysis Saturday. Since then, left arm with exquisite              pain with any touch or movement. History:     Patient has a history of upper extremity pain.  Risk Factors:  Hypertension, hyperlipidemia, Diabetes. Other Factors: Left upper arm AV graft placed 04/07/2023. History of failed left                arm AV fistula and ligated right arm AV graft. 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 Bilateral testing is considered an integral part of a complete examination. Limited examinations for reoccurring indications may be performed as noted.   Summary:  Left: The visualized portions of the left upper extremity graft are       patent with normal appearing flow. There is fluid noted in the       left upper arm that does not appear to be surrounding the       graft. Etiology unknown. Significantly limited exam secondary       to exquisite pain with touch and movement. Suggest Peripheral Vascular Consult.    Preliminary    DG Hand 2 View Left  Result Date: 07/17/2023 CLINICAL DATA:  62 year old male with history of left-sided arm pain. No history of trauma. EXAM: LEFT HAND - 2 VIEW  COMPARISON:  No priors. FINDINGS: Two views of the left hand demonstrate no acute displaced fracture or dislocation. There is multifocal joint space narrowing, subchondral sclerosis, subchondral cyst formation and osteophyte formation, most severe at the first [facility] joint, and at the radiocarpal joint, compatible with advanced osteoarthritis. IMPRESSION: 1. No acute radiographic abnormality of the left hand. 2. Degenerative changes of osteoarthritis, as above. Electronically Signed   By: Trudie Reed M.D.   On: 07/17/2023 07:44   DG Forearm Left  Result Date: 07/17/2023 CLINICAL DATA:  62 year old male with history of right arm pain. No history of trauma. EXAM: LEFT FOREARM - 2 VIEW COMPARISON:  None Available. FINDINGS: There is no evidence of fracture or other focal bone lesions. Degenerative changes are noted at the radiocarpal joint. Soft tissues are unremarkable. IMPRESSION: 1. No acute radiographic abnormality of the left forearm to account for the patient's symptoms. Electronically Signed   By: Trudie Reed M.D.   On: 07/17/2023 07:43    Procedures Procedures    Medications Ordered in ED Medications  oxyCODONE (Oxy IR/ROXICODONE) immediate release tablet 5 mg (5 mg Oral Not Given 07/17/23 0842)  HYDROmorphone (DILAUDID) injection 1 mg (1 mg Intravenous Given 07/17/23 0911)  magnesium oxide (MAG-OX) tablet 800 mg (800 mg Oral Given 07/17/23 0959)  LORazepam (ATIVAN) tablet 1 mg (1 mg Oral Given 07/17/23 1155)  HYDROmorphone (DILAUDID) injection 1 mg (1 mg Intravenous Given 07/17/23 1301)  iohexol (OMNIPAQUE) 350 MG/ML injection 75 mL (75 mLs Intravenous Contrast Given 07/17/23 1356)    ED Course/ Medical Decision Making/ A&P Clinical Course as of 07/17/23 1633  Mon Jul 17, 2023  [facility] [facility] in every T,Th,Sa for dialysis. L arm pain (has a graft in this arm). Very tender. No pulse or sensory deficits. Couldn't get decent [place] images 2/2  pain. CTA also nondiagnostic. Vascular consulted.  If cleared, good to go. Will  [WL]    Clinical Course User Index [WL] Dyanne Iha, MD                                Medical Decision Making Problems Addressed: Left arm pain: complicated acute illness or injury  Amount and/or Complexity of Data Reviewed External Data Reviewed: notes. Labs: ordered. Radiology: ordered and independent interpretation performed.  Risk OTC drugs. Prescription drug management.    Patient is a 3yM w/ PMHx of ESRD on dialysis TTS, COPD on 3L O2 via [place] at baseline, and left AKA, presenting today for left arm pain. On exam, patient has exquisite tenderness of the LUE without any obvious swelling, redness or deformity. Dialysis graft with palpable thrill. He has 2+ radial pulses bilaterally. ROM limited 2/2 pain. Sensation intact.  Presentation concerning for graft thrombosis, DVT, and fracture. Considered cellulitis however no obvious skin changes present. Compartments are soft and no evidence of injury, decreased concern for compartment syndrome. Weakness seems to be related to degree of, pain, doubt CVA or TIA as etiology. Additionally, he has no neck pain or tenderness, but considered radiculopathy.   Patient given IV dilaudid for pain control.   Labwork revealed mild anemia w/ hgb of 9.5, hyponatremia at 133, and hypoMg at 1.2. lactic acid is normal. Patient is given PO magnesium for repletion.  Xrays obtained of the left forearm and hand w/o any acute findings.   Attempted ultrasound of the left upper extremity however, patient had such significant pain, difficulty with obtaining quality images.  It did reveal patent graft, with fluid noted in the left upper arm, not surrounding the graft, etiology unknown.  Patient did require redosing of pain medication while in the ED, with improvement as well as one dose of ativan to tolerate CT scan.   CTA of the left upper extremity performed as well revealing: 1. Degraded examination without definitive  explanation for patient's acute onset of left upper extremity pain. Specifically, the forearm vasculature is suboptimally evaluated though both the radial and ulnar arteries appear patent at the level of the mid and distal aspects of the left forearm. 2. Potential narrowing of the venous anastomosis of the otherwise patent left upper arm AV dialysis graft. Further evaluation with shuntogram could be performed as indicated.  No clear etiology of pain determined.  Discussed case with vascular who will also evaluate for further abnormalities.  Disposition pending their evaluation.  He is hemodynamically stable at this time.  Transfer of care to Dr. Mcneil Sober   Final Clinical Impression(s) / ED Diagnoses Final diagnoses:  Left arm pain    Rx / DC Orders ED Discharge Orders          Ordered    UE VENOUS DUPLEX  Status:  Canceled        07/17/23 0629              Rhys Martini, DO 07/17/23 1633    Long, Arlyss Repress, MD 07/19/23 0011

## 2023-07-17 NOTE — Progress Notes (Signed)
FMTS Note  S: Visited patient at the bedside for intended admission as family medicine teaching service is about to sign over to night team.  Patient stated that he is in extreme amount of pain.  He says that it is difficult to even move his left arm.  Says this pain started in the middle of the night yesterday night.  No inciting events.  Says that his left arm has slowly started to become more swollen.  He had to take off his wedding band as his fingers were starting to swell.  Says the rest of his body is normal amount of edema/swelling.  Patient has been going to dialysis in the ED.  O: BP (!) 116/100 (BP Location: Left Wrist)   Pulse (!) 102   Temp 98.3 F (36.8 C) (Oral)   Resp 16   Ht 5\' 5"  (1.651 m)   SpO2 100%   BMI 50.09 kg/m   General: Uncomfortable appearing, chronically ill, in no acute distress CV: RRR, crescendo decrescendo murmur heard at left sternal border, cap refill within normal limits Resp: Normal work of breathing on 3 L, few whistling wheezes heard dispersed upper lung fields Abd: Soft, non tender, mildly distended Neuro: Alert & Oriented x 4  Extremities: Right lower extremity with edema to the knee 2+, left lower extremity with AKA. Left upper extremity pain to very light touch throughout entire extremity, edematous, warm compared to right upper extremity, slightly erythematous, patient is keeping it very still   A/P: Left upper extremity pain Difficult to ascertain what exactly is the issue.  ED has greatly attempted multiple imaging studies though limited due to patient's great amount of pain.  No clots or thrombus seen at this time. -Vascular following and plan for TVC tomorrow -Night team will admit - Nephrologist on call tomorrow notified for dialysis needs  Lockie Mola, MD 07/17/2023, 6:59 PM PGY-2, [facility] Service pager [phone]

## 2023-07-17 NOTE — H&P (View-Only) (Signed)
Hospital Consult    Reason for Consult:  left arm pain Requesting Physician:  ED MRN #:  [id number]  History of Present Illness: Jeffrey [name] is a 62 y.o. male with a PMH of CHF, COPD, HTN, DMII, MI, and ESRD on dialysis T/Th/Sat who presented to the ED today with left arm pain. The patient states he had dialysis via his left arm graft on Saturday. He feels as if his graft was stuck wrong. A few hours after his dialysis session ended he started having "searing" left arm pain extending from his trapezius to the tips of his fingers. This pain has been worsening with time. He has a difficult time moving his arm due to the pain.   Past Medical History:  Diagnosis Date   Altered mental status 05/04/2021   Arthritis    Blood transfusion without reported diagnosis    CHF (congestive heart failure) (HCC)    COPD (chronic obstructive pulmonary disease) (HCC)    Depression    Diabetes mellitus without complication (HCC)    type 2   Dyspnea    ESRD on hemodialysis (HCC)    Tues Thurs Sat   GERD (gastroesophageal reflux disease)    protonix   Heart murmur    as a child, no problems   Hypertension    Myocardial infarction (HCC)    PTSD (post-traumatic stress disorder)    Sepsis (HCC)    Sleep apnea    occasional uses CPAP   Uses powered wheelchair     Past Surgical History:  Procedure Laterality Date   A/V FISTULAGRAM N/A 08/30/2021   Procedure: A/V FISTULAGRAM;  Surgeon: Maeola Harman, MD;  Location: [facility] INVASIVE CV LAB;  Service: Cardiovascular;  Laterality: N/A;   A/V FISTULAGRAM Right 07/28/2022   Procedure: A/V Fistulagram;  Surgeon: Maeola Harman, MD;  Location[facility] INVASIVE CV LAB;  Service: Cardiovascular;  Laterality: Right;   A/V FISTULAGRAM Right 09/05/2022   Procedure: A/V Fistulagram;  Surgeon: Maeola Harman, MD;  Location: [facility] INVASIVE CV LAB;  Service: Cardiovascular;  Laterality: Right;   A/V FISTULAGRAM Right 10/31/2022   Procedure: A/V  Fistulagram;  Surgeon: Maeola Harman, MD;  Location: [facility] INVASIVE CV LAB;  Service: Cardiovascular;  Laterality: Right;   A/V SHUNT INTERVENTION Right 10/31/2022   Procedure: A/V SHUNT INTERVENTION;  Surgeon: Maeola Harman, MD;  Location: [facility] INVASIVE CV LAB;  Service: Cardiovascular;  Laterality: Right;   A/V SHUNTOGRAM N/A 11/08/2021   Procedure: A/V SHUNTOGRAM;  Surgeon: Maeola Harman, MD;  Location: [facility] INVASIVE CV LAB;  Service: Cardiovascular;  Laterality: N/A;   AMPUTATION Left 04/03/2021   Procedure: LEFT ABOVE-THE-KNEE AMPUTATION;  Surgeon: Terance Hart, MD;  Location: [facility] OR;  Service: Orthopedics;  Laterality: Left;   AV FISTULA PLACEMENT Right 08/12/2021   Procedure: INSERTION OF ARTERIOVENOUS (AV) GORE-TEX GRAFT RIGHT ARM;  Surgeon: Nada Libman, MD;  Location: [facility] OR;  Service: Vascular;  Laterality: Right;   AV FISTULA PLACEMENT Left 04/07/2023   Procedure: ARTERIOVENOUS (AV) FISTULA GRAFT USING GORETEX STRETCH (4-7MM);  Surgeon: Chuck Hint, MD;  Location: [facility] OR;  Service: Vascular;  Laterality: Left;   INSERTION OF DIALYSIS CATHETER N/A 11/25/2022   Procedure: INSERTION OF DIALYSIS CATHETER;  Surgeon: Lorin Glass, MD;  Location: [facility] ENDOSCOPY;  Service: Pulmonary;  Laterality: N/A;   INSERTION OF DIALYSIS CATHETER N/A 03/03/2023   Procedure: INSERTION OF TUNNELED DIALYSIS CATHETER;  Surgeon: Maeola Harman, MD;  Location: [facility] OR;  Service: Vascular;  Laterality: N/A;   INSERTION OF DIALYSIS CATHETER Left 04/07/2023   Procedure: INSERTION OF DIALYSIS CATHETER USING PALINDROME  28CM PRECISION CHRONIC CATHETER KIT;  Surgeon: Chuck Hint, MD;  Location: [facility] OR;  Service: Vascular;  Laterality: Left;   IR REMOVAL TUN CV CATH W/O FL  03/30/2021   JOINT REPLACEMENT     Bilateral knees   LIGATION ARTERIOVENOUS GORTEX GRAFT Right 03/03/2023   Procedure: LIGATION RIGHT ARM ARTERIOVENOUS GORTEX GRAFT;  Surgeon: Maeola Harman, MD;  Location: [facility] OR;  Service: Vascular;  Laterality: Right;   PERIPHERAL VASCULAR BALLOON ANGIOPLASTY Right 08/30/2021   Procedure: PERIPHERAL VASCULAR BALLOON ANGIOPLASTY;  Surgeon: Maeola Harman, MD;  Location: [facility] INVASIVE CV LAB;  Service: Cardiovascular;  Laterality: Right;   PERIPHERAL VASCULAR BALLOON ANGIOPLASTY Right 07/28/2022   Procedure: PERIPHERAL VASCULAR BALLOON ANGIOPLASTY;  Surgeon: Maeola Harman, MD;  Location: [facility] INVASIVE CV LAB;  Service: Cardiovascular;  Laterality: Right;  AVF   PERIPHERAL VASCULAR INTERVENTION  11/08/2021   Procedure: PERIPHERAL VASCULAR INTERVENTION;  Surgeon: Maeola Harman, MD;  Location: [facility] INVASIVE CV LAB;  Service: Cardiovascular;;  Central rt arm fistula   UPPER EXTREMITY VENOGRAPHY Right 08/10/2021   Procedure: UPPER EXTREMITY VENOGRAPHY;  Surgeon: Nada Libman, MD;  Location: [facility] INVASIVE CV LAB;  Service: Cardiovascular;  Laterality: Right;    Allergies  Allergen Reactions   Actos [Pioglitazone] Anaphylaxis and Rash   Dexmedetomidine Nausea And Vomiting and Other (See Comments)    (Precedex) Dose-limiting bradycardia     Ibuprofen Shortness Of Breath, Swelling and Other (See Comments)    Pt tolerates aspirin    Tomato Anaphylaxis and Other (See Comments)    Only allergic to RAW tomatoes   Wellbutrin [Bupropion] Swelling    Prior to Admission medications   Medication Sig Start Date End Date Taking? Authorizing Provider  amLODipine (NORVASC) 10 MG tablet Take 10 mg by mouth every morning. 04/17/20   [provider]  ASPIRIN LOW DOSE 81 MG EC tablet Take 81 mg by mouth in the morning. 02/22/21   [provider]  atorvastatin (LIPITOR) 40 MG tablet Take 40 mg by mouth in the morning. 02/09/21   [provider]  calcium acetate (PHOSLO) 667 MG capsule Take 2 capsules (1,334 mg total) by mouth with breakfast, with lunch, and with evening meal. 04/10/21   Calvert Cantor, MD   Calcium Carbonate Antacid (CALCIUM CARBONATE, DOSED IN MG ELEMENTAL CALCIUM,) 1250 MG/5ML SUSP Take 5 mLs (500 mg of elemental calcium total) by mouth every 6 (six) hours as needed for indigestion. 04/08/23   Rhetta Mura, MD  carvedilol (COREG) 12.5 MG tablet Take 12.5 mg by mouth 2 (two) times daily with a meal. 04/17/20   [provider]  Cholecalciferol (VITAMIN D) 50 MCG (2000 UT) CAPS Take 2,000 Units by mouth daily.    [provider]  clopidogrel (PLAVIX) 75 MG tablet Take 1 tablet (75 mg total) by mouth daily. 11/02/22   Rhyne, Ames Coupe, PA-C  cyclobenzaprine (FLEXERIL) 10 MG tablet Take 10 mg by mouth 3 (three) times daily as needed for muscle spasms. 12/29/21   [provider]  diazepam (VALIUM) 5 MG tablet Take 5 mg by mouth 2 (two) times daily as needed for muscle spasms. 12/09/21   [provider]  insulin regular human CONCENTRATED (HUMULIN R U-500 KWIKPEN) 500 UNIT/ML KwikPen Inject 45 Units into the skin daily with supper. Patient taking differently: Inject 70-125 Units into the skin 2 (two) times daily  with a meal. 09/04/21 08/30/24  [name], MD  metolazone (ZAROXOLYN) 10 MG tablet Take 10 mg by mouth daily. 07/13/21   [provider]  nortriptyline (PAMELOR) 10 MG capsule Take 20 mg by mouth at bedtime. 10/23/19 08/30/24  [provider]  pantoprazole (PROTONIX) 40 MG tablet Take 40 mg by mouth daily before breakfast. 02/09/21   [provider]  pregabalin (LYRICA) 75 MG capsule Take 1 capsule (75 mg total) by mouth See admin instructions. Daily.  Give after dialysis on dialysis days. 01/21/23   [name], MD  sertraline (ZOLOFT) 100 MG tablet Take 100 mg by mouth in the morning. 02/22/21   [provider]  tamsulosin (FLOMAX) 0.4 MG CAPS capsule Take 1 capsule (0.4 mg total) by mouth daily. 11/10/21   [name], MD  torsemide (DEMADEX) 20 MG tablet Take 120 mg by mouth 2 (two) times daily.     [provider]  traZODone (DESYREL) 150 MG tablet Take 150 mg by mouth at bedtime. 12/09/21   [provider]    Social History   Socioeconomic History   Marital status: Significant Other    Spouse name: Not on file   Number of children: Not on file   Years of education: Not on file   Highest education level: Not on file  Occupational History   Occupation: disabled  Tobacco Use   Smoking status: Former    Current packs/day: 0.00    Types: Cigarettes    Quit date: 2012    Years since quitting: 12.6   Smokeless tobacco: Never  Vaping Use   Vaping status: Never Used  Substance and Sexual Activity   Alcohol use: Yes    Comment: occasionally   Drug use: Not Currently    Comment: Years ago   Sexual activity: Not on file  Other Topics Concern   Not on file  Social History Narrative   Not on file   Social Determinants of Health   Financial Resource Strain: Medium Risk (09/05/2022)   Received from [facility], [facility]   Overall Financial Resource Strain (CARDIA)    Difficulty of Paying Living Expenses: Somewhat hard  Food Insecurity: No Food Insecurity (06/01/2023)   Hunger Vital Sign    Worried About Running Out of Food in the Last Year: Never true    Ran Out of Food in the Last Year: Never true  Transportation Needs: No Transportation Needs (06/01/2023)   PRAPARE - Administrator, Civil Service (Medical): No    Lack of Transportation (Non-Medical): No  Physical Activity: Inactive (09/08/2021)   Received from [facility], [facility]   Exercise Vital Sign    Days of Exercise per Week: 0 days    Minutes of Exercise per Session: 0 min  Stress: Stress Concern Present (09/07/2022)   Received from [facility], [facility]   [employer] of Occupational Health - Occupational Stress Questionnaire    Feeling of Stress : Rather much   Social Connections: Moderately Isolated (09/07/2022)   Received from [facility], [facility]   Social Connection and Isolation Panel [NHANES]    Frequency of Communication with Friends and Family: More than three times a week    Frequency of Social Gatherings with Friends and Family: Once a week    Attends Religious Services: 1 to 4 times per year    Active Member of [employer]  or Organizations: No    Attends Banker Meetings: Never    Marital Status: Widowed  Intimate Partner Violence: Not At Risk (06/01/2023)   Humiliation, Afraid, Rape, and Kick questionnaire    Fear of Current or Ex-Partner: No    Emotionally Abused: No    Physically Abused: No    Sexually Abused: No     History reviewed. No pertinent family history.  ROS: Otherwise negative unless mentioned in HPI  Physical Examination  Vitals:   07/17/23 1524  BP: (!) 192/63  Pulse: 98  Resp: 20  Temp: 98.2 F (36.8 C)  SpO2: 98%   Body mass index is 50.09 kg/m.  General:  WDWN in NAD Gait: Not observed HENT: WNL, normocephalic Pulmonary: normal non-labored breathing, on nasal cannula Cardiac: regular Abdomen:  soft, NT/ND Skin: without rashes Vascular Exam/Pulses: 2+ left radial pulse Extremities: left upper arm AVG with great thrill on palpation. Hematoma surrounding graft with exquisite tenderness Musculoskeletal: no muscle wasting or atrophy  Neurologic: A&O X 3;  intact motor and sensation of LUE but painful with movement Psychiatric:  The pt has Normal affect. Lymph:  Unremarkable    CBC    Component Value Date/Time   WBC 6.0 07/17/2023 0830   RBC 3.62 (L) 07/17/2023 0830   HGB 9.5 (L) 07/17/2023 0830   HCT 31.7 (L) 07/17/2023 0830   PLT 111 (L) 07/17/2023 0830   MCV 87.6 07/17/2023 0830   MCH 26.2 07/17/2023 0830   MCHC 30.0 07/17/2023 0830   RDW 16.4 (H) 07/17/2023 0830   LYMPHSABS 0.5 (L) 07/17/2023 0830   MONOABS 0.5 07/17/2023 0830   EOSABS  0.1 07/17/2023 0830   BASOSABS 0.0 07/17/2023 0830    BMET    Component Value Date/Time   NA 133 (L) 07/17/2023 0830   K 3.5 07/17/2023 0830   CL 89 (L) 07/17/2023 0830   CO2 30 07/17/2023 0830   GLUCOSE 249 (H) 07/17/2023 0830   BUN 78 (H) 07/17/2023 0830   CREATININE 5.21 (H) 07/17/2023 0830   CALCIUM 7.1 (L) 07/17/2023 0830   GFRNONAA 12 (L) 07/17/2023 0830    COAGS: Lab Results  Component Value Date   INR 1.1 07/17/2023   INR 1.1 10/19/2022   INR 1.2 03/28/2021     Non-Invasive Vascular Imaging:   LUE Duplex: Graft has normal flow. Fluid noted in the left upper arm not surrounding the graft   ASSESSMENT/PLAN: This is a 62 y.o. male with left arm pain   -The patient has a history of left arm AVG and left internal jugular TDC placement by Dr.Dickson on May 10th. His left arm graft is fully functioning and his [facility] has been removed in the interim -He presents to the ED with left arm pain that started a few hours after his dialysis session on Saturday. This pain has been worsening since it began. He states it extends from his trapezius muscle to his fingertips. -On exam his left upper arm graft has a great thrill. There is hematoma in the upper arm, likely due to infiltration event on Saturday. Potentially this event has caused some of his arm pain however I am not sure if its the primary source. His pain may be neurologic in nature -I do not have any concern for steal given that his left hand is warm and he has a palpable radial pulse -At this time I do not think the patient's graft is infected. He has no fevers, chills, or fluctuance to the arm -  The patient should rest his left arm AVG until his pain and hematoma resolves. We will plan for [facility] placement in the OR tomorrow with Dr.Dickson. NPO at midnight. Consent orders placed  Loel Dubonnet, PA-C [facility] [phone]   I have seen and evaluated the patient. I agree with the PA note as documented  above.  62 year old male with end-stage renal disease that vascular surgery was asked to evaluate left arm pain with functioning left upper arm AV graft.  Patient states his pain started acutely on Saturday.  It goes from the shoulder all the way into the hand.  The graft has an excellent thrill on exam.  He has palpable radial pulse at the wrist and the hand looks well-perfused.  I do not see any tissue loss.  This is not a classic steal presentation.  I do not see any fluid around the graft on CT to suggest infection and on exam there is no fluctuance or erythema.  He is refusing to have the graft accessed for dialysis.  I will post him for [facility] tomorrow with Dr. Edilia Bo and we can rest his arm but I am unclear about the etiology for this.  He is screaming in pain in the ED when the arm is examined.  Please keep n.p.o. after midnight.  Cephus Shelling, MD [facility] [place] Office: [phone]

## 2023-07-17 NOTE — ED Notes (Signed)
ED TO INPATIENT HANDOFF REPORT  ED Nurse Name and Phone #: Cloyde Reams, RN & Susy Frizzle EMT  S Name/Age/Gender Jeffrey [name] 62 y.o. male Room/Bed: H012C/H012C  Code Status   Code Status: Prior  Home/SNF/Other Home Patient oriented to: self, place, time, and situation Is this baseline? Yes   Triage Complete: Triage complete  Chief Complaint Left arm pain [M79.602]  Triage Note Pt arrived from home via [facility] w c/o left sided pain 10/10. Pt in tears in triage.    Allergies Allergies  Allergen Reactions   Actos [Pioglitazone] Anaphylaxis and Rash   Dexmedetomidine Nausea And Vomiting and Other (See Comments)    (Precedex) Dose-limiting bradycardia     Ibuprofen Shortness Of Breath, Swelling and Other (See Comments)    Pt tolerates aspirin    Tomato Anaphylaxis and Other (See Comments)    Only allergic to RAW tomatoes   Wellbutrin [Bupropion] Swelling    Level of Care/Admitting Diagnosis ED Disposition     ED Disposition  Admit   Condition  --   Comment  Hospital Area[facility] [[id number]]  Level of Care: Med-Surg [16]  May admit patient to [facility] or [facility] if equivalent level of care is available:: No  Covid Evaluation: Asymptomatic - no recent exposure (last 10 days) testing not required  Diagnosis: Left arm pain [342322]  Admitting Physician: Lockie Mola [[id number]]  Attending Physician: Acquanetta Belling D [[id number]]  Certification:: I certify this patient will need inpatient services for at least 2 midnights  Expected Medical Readiness: 07/20/2023          B Medical/Surgery History Past Medical History:  Diagnosis Date   Altered mental status 05/04/2021   Arthritis    Blood transfusion without reported diagnosis    CHF (congestive heart failure) (HCC)    COPD (chronic obstructive pulmonary disease) (HCC)    Depression    Diabetes mellitus without complication (HCC)    type 2   Dyspnea    ESRD on hemodialysis (HCC)    Tues  Thurs Sat   GERD (gastroesophageal reflux disease)    protonix   Heart murmur    as a child, no problems   Hypertension    Myocardial infarction (HCC)    PTSD (post-traumatic stress disorder)    Sepsis (HCC)    Sleep apnea    occasional uses CPAP   Uses powered wheelchair    Past Surgical History:  Procedure Laterality Date   A/V FISTULAGRAM N/A 08/30/2021   Procedure: A/V FISTULAGRAM;  Surgeon: Maeola Harman, MD;  Location: [facility] INVASIVE CV LAB;  Service: Cardiovascular;  Laterality: N/A;   A/V FISTULAGRAM Right 07/28/2022   Procedure: A/V Fistulagram;  Surgeon: Maeola Harman, MD;  Location: [facility] INVASIVE CV LAB;  Service: Cardiovascular;  Laterality: Right;   A/V FISTULAGRAM Right 09/05/2022   Procedure: A/V Fistulagram;  Surgeon: Maeola Harman, MD;  Location: [facility] INVASIVE CV LAB;  Service: Cardiovascular;  Laterality: Right;   A/V FISTULAGRAM Right 10/31/2022   Procedure: A/V Fistulagram;  Surgeon: Maeola Harman, MD;  Location: [facility] INVASIVE CV LAB;  Service: Cardiovascular;  Laterality: Right;   A/V SHUNT INTERVENTION Right 10/31/2022   Procedure: A/V SHUNT INTERVENTION;  Surgeon: Maeola Harman, MD;  Location: [facility] INVASIVE CV LAB;  Service: Cardiovascular;  Laterality: Right;   A/V SHUNTOGRAM N/A 11/08/2021   Procedure: A/V SHUNTOGRAM;  Surgeon: Maeola Harman, MD;  Location: [facility] INVASIVE CV LAB;  Service: Cardiovascular;  Laterality: N/A;  AMPUTATION Left 04/03/2021   Procedure: LEFT ABOVE-THE-KNEE AMPUTATION;  Surgeon: Terance Hart, MD;  Location: [facility] OR;  Service: Orthopedics;  Laterality: Left;   AV FISTULA PLACEMENT Right 08/12/2021   Procedure: INSERTION OF ARTERIOVENOUS (AV) GORE-TEX GRAFT RIGHT ARM;  Surgeon: Nada Libman, MD;  Location: [facility] OR;  Service: Vascular;  Laterality: Right;   AV FISTULA PLACEMENT Left 04/07/2023   Procedure: ARTERIOVENOUS (AV) FISTULA GRAFT USING GORETEX STRETCH (4-7MM);  Surgeon:  Chuck Hint, MD;  Location: [facility] OR;  Service: Vascular;  Laterality: Left;   INSERTION OF DIALYSIS CATHETER N/A 11/25/2022   Procedure: INSERTION OF DIALYSIS CATHETER;  Surgeon: Lorin Glass, MD;  Location: [facility] ENDOSCOPY;  Service: Pulmonary;  Laterality: N/A;   INSERTION OF DIALYSIS CATHETER N/A 03/03/2023   Procedure: INSERTION OF TUNNELED DIALYSIS CATHETER;  Surgeon: Maeola Harman, MD;  Location: [facility] OR;  Service: Vascular;  Laterality: N/A;   INSERTION OF DIALYSIS CATHETER Left 04/07/2023   Procedure: INSERTION OF DIALYSIS CATHETER USING PALINDROME  28CM PRECISION CHRONIC CATHETER KIT;  Surgeon: Chuck Hint, MD;  Location: [facility] OR;  Service: Vascular;  Laterality: Left;   IR REMOVAL TUN CV CATH W/O FL  03/30/2021   JOINT REPLACEMENT     Bilateral knees   LIGATION ARTERIOVENOUS GORTEX GRAFT Right 03/03/2023   Procedure: LIGATION RIGHT ARM ARTERIOVENOUS GORTEX GRAFT;  Surgeon: Maeola Harman, MD;  Location: [facility] OR;  Service: Vascular;  Laterality: Right;   PERIPHERAL VASCULAR BALLOON ANGIOPLASTY Right 08/30/2021   Procedure: PERIPHERAL VASCULAR BALLOON ANGIOPLASTY;  Surgeon: Maeola Harman, MD;  Location: [facility] INVASIVE CV LAB;  Service: Cardiovascular;  Laterality: Right;   PERIPHERAL VASCULAR BALLOON ANGIOPLASTY Right 07/28/2022   Procedure: PERIPHERAL VASCULAR BALLOON ANGIOPLASTY;  Surgeon: Maeola Harman, MD;  Location: [facility] INVASIVE CV LAB;  Service: Cardiovascular;  Laterality: Right;  AVF   PERIPHERAL VASCULAR INTERVENTION  11/08/2021   Procedure: PERIPHERAL VASCULAR INTERVENTION;  Surgeon: Maeola Harman, MD;  Location: [facility] INVASIVE CV LAB;  Service: Cardiovascular;;  Central rt arm fistula   UPPER EXTREMITY VENOGRAPHY Right 08/10/2021   Procedure: UPPER EXTREMITY VENOGRAPHY;  Surgeon: Nada Libman, MD;  Location: [facility] INVASIVE CV LAB;  Service: Cardiovascular;  Laterality: Right;     A IV Location/Drains/Wounds Patient  Lines/Drains/Airways Status     Active Line/Drains/Airways     Name Placement date Placement time Site Days   Peripheral IV 07/17/23 20 G 1.88" Right;Anterior Forearm 07/17/23  0820  Forearm  less than 1   Fistula / Graft Left Upper arm Arteriovenous vein graft 04/22/23  1000  Upper arm  86            Intake/Output Last 24 hours  Intake/Output Summary (Last 24 hours) at 07/17/2023 1906 Last data filed at 07/17/2023 0853 Gross per 24 hour  Intake --  Output 400 ml  Net -400 ml    Labs/Imaging Results for orders placed or performed during the hospital encounter of 07/17/23 (from the past 48 hour(s))  CBC with Differential     Status: Abnormal   Collection Time: 07/17/23  8:30 AM  Result Value Ref Range   WBC 6.0 4.0 - 10.5 K/uL   RBC 3.62 (L) 4.22 - 5.81 MIL/uL   Hemoglobin 9.5 (L) 13.0 - 17.0 g/dL   HCT 57.8 (L) 46.9 - 62.9 %   MCV 87.6 80.0 - 100.0 fL   MCH 26.2 26.0 - 34.0 pg   MCHC 30.0 30.0 - 36.0 g/dL   RDW 52.8 (  H) 11.5 - 15.5 %   Platelets 111 (L) 150 - 400 K/uL   nRBC 0.0 0.0 - 0.2 %   Neutrophils Relative % 79 %   Neutro Abs 4.7 1.7 - 7.7 K/uL   Lymphocytes Relative 9 %   Lymphs Abs 0.5 (L) 0.7 - 4.0 K/uL   Monocytes Relative 9 %   Monocytes Absolute 0.5 0.1 - 1.0 K/uL   Eosinophils Relative 2 %   Eosinophils Absolute 0.1 0.0 - 0.5 K/uL   Basophils Relative 0 %   Basophils Absolute 0.0 0.0 - 0.1 K/uL   Immature Granulocytes 1 %   Abs Immature Granulocytes 0.05 0.00 - 0.07 K/uL    Comment: Performed at [facility], [address] [place], [place] [address]  Protime-INR     Status: None   Collection Time: 07/17/23  8:30 AM  Result Value Ref Range   Prothrombin Time 14.4 11.4 - 15.2 seconds   INR 1.1 0.8 - 1.2    Comment: (NOTE) INR goal varies based on device and disease states. Performed at [facility], [address], [place], [place] [address]   Comprehensive metabolic panel     Status: Abnormal   Collection Time: 07/17/23  8:30 AM   Result Value Ref Range   Sodium 133 (L) 135 - 145 mmol/L   Potassium 3.5 3.5 - 5.1 mmol/L   Chloride 89 (L) 98 - 111 mmol/L   CO2 30 22 - 32 mmol/L   Glucose, Bld 249 (H) 70 - 99 mg/dL    Comment: Glucose reference range applies only to samples taken after fasting for at least 8 hours.   BUN 78 (H) 8 - 23 mg/dL   Creatinine, Ser 3.47 (H) 0.61 - 1.24 mg/dL   Calcium 7.1 (L) 8.9 - 10.3 mg/dL   Total Protein 7.1 6.5 - 8.1 g/dL   Albumin 3.5 3.5 - 5.0 g/dL   AST 15 15 - 41 U/L   ALT 14 0 - 44 U/L   Alkaline Phosphatase 84 38 - 126 U/L   Total Bilirubin 0.4 0.3 - 1.2 mg/dL   GFR, Estimated 12 (L) >60 mL/min    Comment: (NOTE) Calculated using the CKD-EPI Creatinine Equation (2021)    Anion gap 14 5 - 15    Comment: Performed at [facility], [address] [place], [place] [address]  Magnesium     Status: Abnormal   Collection Time: 07/17/23  8:30 AM  Result Value Ref Range   Magnesium 1.2 (L) 1.7 - 2.4 mg/dL    Comment: Performed at [facility], [address] [place], [place] [address]  I-Stat CG4 Lactic Acid     Status: None   Collection Time: 07/17/23  8:39 AM  Result Value Ref Range   Lactic Acid, Venous 1.1 0.5 - 1.9 mmol/L   VAS [place] UPPER EXTREMITY ARTERIAL DUPLEX  Result Date: 07/17/2023  UPPER EXTREMITY DUPLEX STUDY Patient Name:  Jeffrey [name]  Date of Exam:   07/17/2023 Medical Rec #: [id number]      Accession #:    [id number] Date of Birth: December 15, 1960      Patient Gender: M Patient Age:   7 years Exam Location:  [facility] Procedure:      VAS [place] UPPER EXTREMITY ARTERIAL DUPLEX Referring Phys: Sharilyn Sites --------------------------------------------------------------------------------  Indications: Patient had dialysis Saturday. Since then, left arm with exquisite  pain with any touch or movement. History:     Patient has a history of upper extremity pain.  Risk Factors:  Hypertension, hyperlipidemia, Diabetes. Other Factors: Left upper arm  AV graft placed 04/07/2023. History of failed left                arm AV fistula and ligated right arm AV graft. 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 Bilateral testing is considered an integral part of a complete examination. Limited examinations for reoccurring indications may be performed as noted.   Summary:  Left: The visualized portions of the left upper extremity graft are       patent with normal appearing flow. There is fluid noted in the       left upper arm that does not appear to be surrounding the       graft. Etiology unknown. Significantly limited exam secondary       to exquisite pain with touch and movement. Suggest Peripheral Vascular Consult. Electronically signed by Sherald Hess MD on 07/17/2023 at 5:35:35 PM.    Final    CT ANGIO UP EXTREM LEFT W &/OR WO CONTAST  Result Date: 07/17/2023 CLINICAL DATA:  Sudden onset of severe left upper extremity pain. History of end-stage renal disease with creation of a left upper extremity AV graft on 04/22/2023. EXAM: CT ANGIOGRAPHY OF THE LEFT UPPER EXTREMITY TECHNIQUE: Multidetector CT imaging of the left upperwas performed using the standard protocol during bolus administration of intravenous contrast. Multiplanar CT image reconstructions and MIPs were obtained to evaluate the vascular anatomy. RADIATION DOSE REDUCTION: This exam was performed according to the departmental dose-optimization program which includes automated exposure control, adjustment of the mA and/or kV according to patient size and/or use of iterative reconstruction technique. CONTRAST:  75mL OMNIPAQUE IOHEXOL 350 MG/ML SOLN COMPARISON:  Left hand and forearm  radiographs-earlier same day FINDINGS: Vascular Findings: Examination is significantly degraded due to patient body habitus and limited ability to appropriately position the patient for conventional imaging positioning. Thoracic aorta: No definite evidence of thoracic aortic aneurysm or dissection perivascular stranding on this nongated examination. Aortic Arch: Conventional configuration of the aortic arch. The branch vessels of the aortic arch appear patent throughout their imaged courses. Left subclavian artery: Widely patent without a hemodynamically significant narrowing. Left axillary artery: Widely patent without a hemodynamically significant narrowing. Left brachial artery: Patent to the level of the arterial anastomosis of the left upper extremity AV graft. The left brachial artery bifurcates just distal to the arterial anastomosis. Left forearm vasculature: The left interosseous artery becomes atretic and/or occludes shortly after its origin (image 142, series 5). The origin and proximal aspect of the left ulnar artery is suboptimally evaluated, potentially due to vessel tortuosity and/or motion though the ulnar artery does appear patent at the level of the mid and distal aspects of the left forearm. The left upper arm AV graft appears patent though there is apparent smooth narrowing at the level of the venous anastomosis (image 74, series 5; coronal image 111, series 9). There is a minimal amount of perigraft stranding without sizable hematoma or definable/drainable fluid collection. _________________________________________________________ Nonvascular Findings: Post right total shoulder replacement and posterior cervical decompression, incompletely evaluated. Cardiomegaly with enlargement of the  caliber of the main pulmonary artery, measuring 3.7 cm in diameter (image 76, series 5). Review of the MIP images confirms the above findings. IMPRESSION: 1. Degraded examination without definitive explanation  for patient's acute onset of left upper extremity pain. Specifically, the forearm vasculature is suboptimally evaluated though both the radial and ulnar arteries appear patent at the level of the mid and distal aspects of the left forearm. 2. Potential narrowing of the venous anastomosis of the otherwise patent left upper arm AV dialysis graft. Further evaluation with shuntogram could be performed as indicated. Electronically Signed   By: Simonne Come M.D.   On: 07/17/2023 14:45   DG Hand 2 View Left  Result Date: 07/17/2023 CLINICAL DATA:  62 year old male with history of left-sided arm pain. No history of trauma. EXAM: LEFT HAND - 2 VIEW COMPARISON:  No priors. FINDINGS: Two views of the left hand demonstrate no acute displaced fracture or dislocation. There is multifocal joint space narrowing, subchondral sclerosis, subchondral cyst formation and osteophyte formation, most severe at the first [facility] joint, and at the radiocarpal joint, compatible with advanced osteoarthritis. IMPRESSION: 1. No acute radiographic abnormality of the left hand. 2. Degenerative changes of osteoarthritis, as above. Electronically Signed   By: Trudie Reed M.D.   On: 07/17/2023 07:44   DG Forearm Left  Result Date: 07/17/2023 CLINICAL DATA:  62 year old male with history of right arm pain. No history of trauma. EXAM: LEFT FOREARM - 2 VIEW COMPARISON:  None Available. FINDINGS: There is no evidence of fracture or other focal bone lesions. Degenerative changes are noted at the radiocarpal joint. Soft tissues are unremarkable. IMPRESSION: 1. No acute radiographic abnormality of the left forearm to account for the patient's symptoms. Electronically Signed   By: Trudie Reed M.D.   On: 07/17/2023 07:43    Pending Labs Unresulted Labs (From admission, onward)     Start     Ordered   Signed and Held  Surgical pcr screen  Once,   R        Signed and Held            Vitals/Pain Today's Vitals   07/17/23 0913  07/17/23 1001 07/17/23 1524 07/17/23 1806  BP:   (!) 192/63 (!) 116/100  Pulse:   98 (!) 102  Resp:   20 16  Temp:   98.2 F (36.8 C) 98.3 F (36.8 C)  TempSrc:    Oral  SpO2:   98% 100%  Height:      PainSc: 6  6       Isolation Precautions No active isolations  Medications Medications  oxyCODONE (Oxy IR/ROXICODONE) immediate release tablet 5 mg (5 mg Oral Not Given 07/17/23 0842)  HYDROmorphone (DILAUDID) injection 1 mg (1 mg Intravenous Given 07/17/23 0911)  magnesium oxide (MAG-OX) tablet 800 mg (800 mg Oral Given 07/17/23 0959)  LORazepam (ATIVAN) tablet 1 mg (1 mg Oral Given 07/17/23 1155)  HYDROmorphone (DILAUDID) injection 1 mg (1 mg Intravenous Given 07/17/23 1301)  iohexol (OMNIPAQUE) 350 MG/ML injection 75 mL (75 mLs Intravenous Contrast Given 07/17/23 1356)  HYDROmorphone (DILAUDID) injection 1 mg (1 mg Intravenous Given 07/17/23 1845)    Mobility power wheelchair     Focused Assessments    R Recommendations: See Admitting Provider Note  Report given to:   Additional Notes: Call or epic message for any additional details

## 2023-07-17 NOTE — ED Notes (Signed)
Spoke to resident regarding double pain medications.  Roxicodone was in case IV was not established.

## 2023-07-17 NOTE — ED Provider Notes (Signed)
  Physical Exam  BP (!) 116/100 (BP Location: Left Wrist)   Pulse (!) 102   Temp 98.3 F (36.8 C) (Oral)   Resp 16   Ht 5\' 5"  (1.651 m)   SpO2 100%   BMI 50.09 kg/m   Physical Exam Vitals and nursing note reviewed.  Constitutional:      General: He is not in acute distress.    Appearance: He is well-developed.  HENT:     Head: Normocephalic and atraumatic.  Eyes:     Conjunctiva/sclera: Conjunctivae normal.  Cardiovascular:     Rate and Rhythm: Normal rate and regular rhythm.     Heart sounds: No murmur heard. Pulmonary:     Effort: Pulmonary effort is normal. No respiratory distress.     Breath sounds: Normal breath sounds.  Abdominal:     Palpations: Abdomen is soft.     Tenderness: There is no abdominal tenderness.  Musculoskeletal:        General: Tenderness present. No swelling or deformity.     Cervical back: Neck supple.  Skin:    General: Skin is warm and dry.     Capillary Refill: Capillary refill takes less than 2 seconds.     Findings: No erythema or lesion.  Neurological:     Mental Status: He is alert.  Psychiatric:        Mood and Affect: Mood normal.     Procedures  Procedures  ED Course / MDM   Clinical Course as of 07/17/23 Jeanne Ivan Jul 17, 2023  [address] [facility] in every T,Th,Sa for dialysis. L arm pain (has a graft in this arm). Very tender. No pulse or sensory deficits. Couldn't get decent [place] images 2/2 pain. CTA also nondiagnostic. Vascular consulted. If cleared, good to go. Will  [WL]    Clinical Course User Index [WL] Dyanne Iha, MD   Medical Decision Making Amount and/or Complexity of Data Reviewed Radiology: ordered.  Risk OTC drugs. Prescription drug management.   Vascular studies resulted nondiagnostic.  After speaking with vascular surgery, low concern for infiltration and low concern for infection.  Graft appears to be functioning normally.  Unclear what is causing patient's symptoms at this time.  Per vascular  surgery, patient will be evaluated tomorrow for operative management to receive a [facility] for which to receive dialysis through in the interim.  Hospitalist paged for admission.  Please see inpatient provider notes for further details.       Dyanne Iha, MD 07/17/23 Rickey Primus    Lonell Grandchild, MD 07/18/23 [phone]

## 2023-07-18 ENCOUNTER — Encounter (HOSPITAL_COMMUNITY)
Admission: EM | Disposition: A | Discharge status: Home / Self Care | Payer: Self-pay | Source: Home / Self Care | Attending: Family Medicine

## 2023-07-18 ENCOUNTER — Inpatient Hospital Stay (HOSPITAL_COMMUNITY): Payer: 59 | Admitting: Anesthesiology

## 2023-07-18 ENCOUNTER — Other Ambulatory Visit: Payer: Self-pay

## 2023-07-18 ENCOUNTER — Encounter (HOSPITAL_COMMUNITY): Payer: Self-pay | Admitting: Family Medicine

## 2023-07-18 ENCOUNTER — Inpatient Hospital Stay (HOSPITAL_COMMUNITY): Payer: 59

## 2023-07-18 DIAGNOSIS — I132 Hypertensive heart and chronic kidney disease with heart failure and with stage 5 chronic kidney disease, or end stage renal disease: Secondary | ICD-10-CM

## 2023-07-18 DIAGNOSIS — Z992 Dependence on renal dialysis: Secondary | ICD-10-CM

## 2023-07-18 DIAGNOSIS — N186 End stage renal disease: Secondary | ICD-10-CM

## 2023-07-18 DIAGNOSIS — I5032 Chronic diastolic (congestive) heart failure: Secondary | ICD-10-CM

## 2023-07-18 DIAGNOSIS — M79602 Pain in left arm: Secondary | ICD-10-CM | POA: Diagnosis not present

## 2023-07-18 HISTORY — PX: INSERTION OF DIALYSIS CATHETER: SHX1324

## 2023-07-18 LAB — RENAL FUNCTION PANEL
Albumin: 3.4 g/dL — ABNORMAL LOW (ref 3.5–5.0)
Anion gap: 16 — ABNORMAL HIGH (ref 5–15)
BUN: 83 mg/dL — ABNORMAL HIGH (ref 8–23)
CO2: 28 mmol/L (ref 22–32)
Calcium: 6.9 mg/dL — ABNORMAL LOW (ref 8.9–10.3)
Chloride: 89 mmol/L — ABNORMAL LOW (ref 98–111)
Creatinine, Ser: 5.31 mg/dL — ABNORMAL HIGH (ref 0.61–1.24)
GFR, Estimated: 11 mL/min — ABNORMAL LOW (ref >60)
Glucose, Bld: 266 mg/dL — ABNORMAL HIGH (ref 70–99)
Phosphorus: 6.4 mg/dL — ABNORMAL HIGH (ref 2.5–4.6)
Potassium: 3.5 mmol/L (ref 3.5–5.1)
Sodium: 133 mmol/L — ABNORMAL LOW (ref 135–145)

## 2023-07-18 LAB — GLUCOSE, CAPILLARY
Glucose-Capillary: 292 mg/dL — ABNORMAL HIGH (ref 70–99)
Glucose-Capillary: 287 mg/dL — ABNORMAL HIGH (ref 70–99)
Glucose-Capillary: 245 mg/dL — ABNORMAL HIGH (ref 70–99)
Glucose-Capillary: 185 mg/dL — ABNORMAL HIGH (ref 70–99)
Glucose-Capillary: 182 mg/dL — ABNORMAL HIGH (ref 70–99)
Glucose-Capillary: 192 mg/dL — ABNORMAL HIGH (ref 70–99)

## 2023-07-18 LAB — POCT I-STAT, CHEM 8
BUN: 98 mg/dL — ABNORMAL HIGH (ref 8–23)
Calcium, Ion: 0.86 mmol/L — CL (ref 1.15–1.40)
Chloride: 88 mmol/L — ABNORMAL LOW (ref 98–111)
Creatinine, Ser: 5.7 mg/dL — ABNORMAL HIGH (ref 0.61–1.24)
Glucose, Bld: 248 mg/dL — ABNORMAL HIGH (ref 70–99)
HCT: 30 % — ABNORMAL LOW (ref 39.0–52.0)
Hemoglobin: 10.2 g/dL — ABNORMAL LOW (ref 13.0–17.0)
Potassium: 3.6 mmol/L (ref 3.5–5.1)
Sodium: 134 mmol/L — ABNORMAL LOW (ref 135–145)
TCO2: 33 mmol/L — ABNORMAL HIGH (ref 22–32)

## 2023-07-18 LAB — URIC ACID: Uric Acid, Serum: 10.5 mg/dL — ABNORMAL HIGH (ref 3.7–8.6)

## 2023-07-18 LAB — MAGNESIUM: Magnesium: 1.2 mg/dL — ABNORMAL LOW (ref 1.7–2.4)

## 2023-07-18 SURGERY — INSERTION OF DIALYSIS CATHETER
Anesthesia: General | Site: Neck | Laterality: Left

## 2023-07-18 MED ORDER — FENTANYL CITRATE (PF) 250 MCG/5ML IJ SOLN
INTRAMUSCULAR | Status: AC
  Filled 2023-07-18: qty 5

## 2023-07-18 MED ORDER — ORAL CARE MOUTH RINSE: 15.0000 mL | Freq: Once | OROMUCOSAL | Status: AC

## 2023-07-18 MED ORDER — DEXAMETHASONE SODIUM PHOSPHATE 10 MG/ML IJ SOLN
INTRAMUSCULAR | Status: AC
  Filled 2023-07-18: qty 1

## 2023-07-18 MED ORDER — HYDROMORPHONE HCL 1 MG/ML IJ SOLN
1.0000 mg | INTRAMUSCULAR | Status: DC | PRN
  Administered 2023-07-18 – 2023-07-20 (×6): 1 mg via INTRAVENOUS
  Filled 2023-07-18 (×6): qty 1

## 2023-07-18 MED ORDER — HEPARIN SODIUM (PORCINE) 1000 UNIT/ML IJ SOLN
INTRAMUSCULAR | Status: DC | PRN
  Administered 2023-07-18: 1000 [IU] via INTRAVENOUS

## 2023-07-18 MED ORDER — ONDANSETRON HCL 4 MG/2ML IJ SOLN
INTRAMUSCULAR | Status: DC | PRN
Start: 2023-07-18 — End: 2023-07-18
  Administered 2023-07-18: 4 mg via INTRAVENOUS

## 2023-07-18 MED ORDER — SUGAMMADEX SODIUM 200 MG/2ML IV SOLN
INTRAVENOUS | Status: DC | PRN
  Administered 2023-07-18: 272.8 mg via INTRAVENOUS

## 2023-07-18 MED ORDER — PREDNISONE 20 MG PO TABS
30.0000 mg | ORAL_TABLET | Freq: Every day | ORAL | Status: DC
  Administered 2023-07-19 – 2023-07-21 (×3): 30 mg via ORAL
  Filled 2023-07-18: qty 3
  Filled 2023-07-18 (×2): qty 1

## 2023-07-18 MED ORDER — INSULIN ASPART 100 UNIT/ML IJ SOLN
0.0000 [IU] | INTRAMUSCULAR | Status: DC | PRN
  Administered 2023-07-18: 3 [IU] via SUBCUTANEOUS

## 2023-07-18 MED ORDER — SODIUM CHLORIDE 0.9 % IV SOLN: INTRAVENOUS | Status: DC

## 2023-07-18 MED ORDER — ROCURONIUM BROMIDE 10 MG/ML (PF) SYRINGE
PREFILLED_SYRINGE | INTRAVENOUS | Status: DC | PRN
  Administered 2023-07-18: 50 mg via INTRAVENOUS

## 2023-07-18 MED ORDER — LIDOCAINE 2% (20 MG/ML) 5 ML SYRINGE
INTRAMUSCULAR | Status: AC
  Filled 2023-07-18: qty 5

## 2023-07-18 MED ORDER — SUCCINYLCHOLINE CHLORIDE 200 MG/10ML IV SOSY
PREFILLED_SYRINGE | INTRAVENOUS | Status: AC
  Filled 2023-07-18: qty 10

## 2023-07-18 MED ORDER — CALCITRIOL 0.25 MCG PO CAPS
0.2500 ug | ORAL_CAPSULE | Freq: Every day | ORAL | Status: DC
  Administered 2023-07-18 – 2023-07-23 (×6): 0.25 ug via ORAL
  Filled 2023-07-18 (×7): qty 1

## 2023-07-18 MED ORDER — FENTANYL CITRATE (PF) 250 MCG/5ML IJ SOLN
INTRAMUSCULAR | Status: DC | PRN
  Administered 2023-07-18: 25 ug via INTRAVENOUS

## 2023-07-18 MED ORDER — HEPARIN 6000 UNIT IRRIGATION SOLUTION
Status: DC | PRN
  Administered 2023-07-18: 1

## 2023-07-18 MED ORDER — INSULIN ASPART 100 UNIT/ML IJ SOLN
INTRAMUSCULAR | Status: AC
  Filled 2023-07-18: qty 1

## 2023-07-18 MED ORDER — DEXTROSE 5 % IV SOLN
INTRAVENOUS | Status: DC | PRN
  Administered 2023-07-18: 3 g via INTRAVENOUS

## 2023-07-18 MED ORDER — CHLORHEXIDINE GLUCONATE 0.12 % MT SOLN
15.0000 mL | Freq: Once | OROMUCOSAL | Status: AC
  Administered 2023-07-18: 15 mL via OROMUCOSAL
  Filled 2023-07-18 (×2): qty 15

## 2023-07-18 MED ORDER — SUCCINYLCHOLINE CHLORIDE 200 MG/10ML IV SOSY
PREFILLED_SYRINGE | INTRAVENOUS | Status: DC | PRN
  Administered 2023-07-18: 200 mg via INTRAVENOUS

## 2023-07-18 MED ORDER — MIDAZOLAM HCL 2 MG/2ML IJ SOLN
INTRAMUSCULAR | Status: AC
  Filled 2023-07-18: qty 2

## 2023-07-18 MED ORDER — PROPOFOL 10 MG/ML IV BOLUS
INTRAVENOUS | Status: AC
  Filled 2023-07-18: qty 20

## 2023-07-18 MED ORDER — PROPOFOL 10 MG/ML IV BOLUS
INTRAVENOUS | Status: DC | PRN
Start: 2023-07-18 — End: 2023-07-18
  Administered 2023-07-18: 150 mg via INTRAVENOUS

## 2023-07-18 MED ORDER — CALCIUM GLUCONATE-NACL 2-0.675 GM/100ML-% IV SOLN
2.0000 g | Freq: Once | INTRAVENOUS | Status: AC
  Administered 2023-07-18: 2000 mg via INTRAVENOUS
  Filled 2023-07-18: qty 100

## 2023-07-18 MED ORDER — LIDOCAINE 2% (20 MG/ML) 5 ML SYRINGE
INTRAMUSCULAR | Status: DC | PRN
  Administered 2023-07-18: 100 mg via INTRAVENOUS

## 2023-07-18 MED ORDER — INSULIN ASPART 100 UNIT/ML IJ SOLN
0.0000 [IU] | Freq: Three times a day (TID) | INTRAMUSCULAR | Status: DC
  Administered 2023-07-18: 2 [IU] via SUBCUTANEOUS
  Administered 2023-07-19: 7 [IU] via SUBCUTANEOUS
  Administered 2023-07-19: 9 [IU] via SUBCUTANEOUS
  Administered 2023-07-19: 3 [IU] via SUBCUTANEOUS

## 2023-07-18 MED ORDER — CALCIUM CARBONATE ANTACID 500 MG PO CHEW
1.0000 | CHEWABLE_TABLET | Freq: Three times a day (TID) | ORAL | Status: DC
  Administered 2023-07-18 – 2023-07-23 (×11): 200 mg via ORAL
  Filled 2023-07-18 (×12): qty 1

## 2023-07-18 MED ORDER — CEFAZOLIN IN SODIUM CHLORIDE 3-0.9 GM/100ML-% IV SOLN
INTRAVENOUS | Status: AC
  Filled 2023-07-18: qty 100

## 2023-07-18 MED ORDER — PHENYLEPHRINE 80 MCG/ML (10ML) SYRINGE FOR IV PUSH (FOR BLOOD PRESSURE SUPPORT)
PREFILLED_SYRINGE | INTRAVENOUS | Status: DC | PRN
  Administered 2023-07-18: 80 ug via INTRAVENOUS
  Administered 2023-07-18: 160 ug via INTRAVENOUS
  Administered 2023-07-18: 80 ug via INTRAVENOUS

## 2023-07-18 MED ORDER — HEPARIN SODIUM (PORCINE) 1000 UNIT/ML DIALYSIS
2000.0000 [IU] | Freq: Once | INTRAMUSCULAR | Status: DC
  Filled 2023-07-18: qty 2

## 2023-07-18 MED ORDER — ONDANSETRON HCL 4 MG/2ML IJ SOLN
INTRAMUSCULAR | Status: AC
  Filled 2023-07-18: qty 2

## 2023-07-18 MED ORDER — 0.9 % SODIUM CHLORIDE (POUR BTL) OPTIME
TOPICAL | Status: DC | PRN
  Administered 2023-07-18: 1000 mL

## 2023-07-18 MED ORDER — CHLORHEXIDINE GLUCONATE CLOTH 2 % EX PADS
6.0000 | MEDICATED_PAD | Freq: Every day | CUTANEOUS | Status: DC
  Administered 2023-07-18 – 2023-07-19 (×2): 6 via TOPICAL

## 2023-07-18 SURGICAL SUPPLY — 52 items
ADH SKN CLS APL DERMABOND .7 (GAUZE/BANDAGES/DRESSINGS) ×1
APL PRP STRL LF DISP 70% ISPRP (MISCELLANEOUS) ×1
BAG BANDED W/RUBBER/TAPE 36X54 (MISCELLANEOUS) IMPLANT
BAG COUNTER SPONGE SURGICOUNT (BAG) ×1 IMPLANT
BAG DECANTER FOR FLEXI CONT (MISCELLANEOUS) ×1 IMPLANT
BAG EQP BAND 135X91 W/RBR TAPE (MISCELLANEOUS) ×1
BAG SPNG CNTER NS LX DISP (BAG) ×1
BIOPATCH RED 1 DISK 7.0 (GAUZE/BANDAGES/DRESSINGS) ×1 IMPLANT
CATH ANGIO 5F BER2 65CM (CATHETERS) IMPLANT
CATH PALINDROME-P 19CM W/VT (CATHETERS) IMPLANT
CATH PALINDROME-P 23CM W/VT (CATHETERS) IMPLANT
CATH PALINDROME-P 28CM W/VT (CATHETERS) IMPLANT
CHLORAPREP W/TINT 26 (MISCELLANEOUS) ×1 IMPLANT
COVER DOME SNAP 22 D (MISCELLANEOUS) IMPLANT
COVER PROBE W GEL 5X96 (DRAPES) IMPLANT
COVER SURGICAL LIGHT HANDLE (MISCELLANEOUS) ×1 IMPLANT
DERMABOND ADVANCED .7 DNX12 (GAUZE/BANDAGES/DRESSINGS) IMPLANT
DEVICE TORQUE KENDALL .025-038 (MISCELLANEOUS) IMPLANT
DRAPE C-ARM 42X72 X-RAY (DRAPES) ×1 IMPLANT
DRAPE CHEST BREAST 15X10 FENES (DRAPES) ×1 IMPLANT
GAUZE 4X4 16PLY ~~LOC~~+RFID DBL (SPONGE) ×1 IMPLANT
GLIDEWIRE ADV .035X180CM (WIRE) IMPLANT
GLOVE BIO SURGEON STRL SZ7.5 (GLOVE) ×1 IMPLANT
GLOVE BIOGEL PI IND STRL 8 (GLOVE) ×1 IMPLANT
GLOVE SURG POLY ORTHO LF SZ7.5 (GLOVE) IMPLANT
GLOVE SURG UNDER LTX SZ8 (GLOVE) ×1 IMPLANT
GOWN STRL REUS W/ TWL LRG LVL3 (GOWN DISPOSABLE) ×2 IMPLANT
GOWN STRL REUS W/TWL LRG LVL3 (GOWN DISPOSABLE) ×2
GUIDEWIRE ANGLED .035X150CM (WIRE) IMPLANT
KIT BASIN OR (CUSTOM PROCEDURE TRAY) ×1 IMPLANT
KIT PALINDROME-P 55CM (CATHETERS) IMPLANT
KIT TURNOVER KIT B (KITS) ×1 IMPLANT
NDL 18GX1X1/2 (RX/OR ONLY) (NEEDLE) ×1 IMPLANT
NDL HYPO 25GX1X1/2 BEV (NEEDLE) ×1 IMPLANT
NEEDLE 18GX1X1/2 (RX/OR ONLY) (NEEDLE) ×1 IMPLANT
NEEDLE HYPO 25GX1X1/2 BEV (NEEDLE) ×1 IMPLANT
NS IRRIG 1000ML POUR BTL (IV SOLUTION) ×1 IMPLANT
PACK BASIC III (CUSTOM PROCEDURE TRAY) ×1
PACK SRG BSC III STRL LF ECLPS (CUSTOM PROCEDURE TRAY) ×1 IMPLANT
PAD ARMBOARD 7.5X6 YLW CONV (MISCELLANEOUS) ×2 IMPLANT
PENCIL SMOKE EVACUATOR (MISCELLANEOUS) IMPLANT
SET MICROPUNCTURE 5F STIFF (MISCELLANEOUS) IMPLANT
SUT ETHILON 3 0 PS 1 (SUTURE) ×1 IMPLANT
SUT MNCRL AB 4-0 PS2 18 (SUTURE) ×1 IMPLANT
SYR 10ML LL (SYRINGE) ×1 IMPLANT
SYR 20ML LL LF (SYRINGE) ×2 IMPLANT
SYR 5ML LL (SYRINGE) ×2 IMPLANT
SYR CONTROL 10ML LL (SYRINGE) ×1 IMPLANT
TOWEL GREEN STERILE (TOWEL DISPOSABLE) ×2 IMPLANT
TOWEL GREEN STERILE FF (TOWEL DISPOSABLE) ×1 IMPLANT
WATER STERILE IRR 1000ML POUR (IV SOLUTION) ×1 IMPLANT
WIRE TORQFLEX AUST .018X40CM (WIRE) IMPLANT

## 2023-07-18 NOTE — Assessment & Plan Note (Addendum)
T/Th/Sat dialysis patient, coordinating with HD - AM RFP and Mag daily

## 2023-07-18 NOTE — Assessment & Plan Note (Addendum)
T2DM on 70-125 units regular insulin BID with meals at home.  CBGs 233>292 fasting, received 20U long acting and 3 units short acting (is NPO) in past 24 hours.  Concern for exacerbation of hyperglycemia with addition of prednisone. - Will reevaluate this afternoon, likely increase to 20U semglee BID - Increase to sensitive SSI from very sensitive SSI - CBG monitoring

## 2023-07-18 NOTE — Anesthesia Preprocedure Evaluation (Addendum)
Anesthesia Evaluation  Patient identified by MRN, date of birth, ID band Patient awake    Reviewed: Allergy & Precautions, H&P , NPO status , Patient's Chart, lab work & pertinent test results  Airway Mallampati: III  TM Distance: <3 FB Neck ROM: Full    Dental  (+) Edentulous Upper, Poor Dentition, Chipped, Dental Advisory Given   Pulmonary sleep apnea , COPD,  oxygen dependent, former smoker   breath sounds clear to auscultation + decreased breath sounds      Cardiovascular hypertension, + Past MI  Normal cardiovascular exam Rhythm:Regular Rate:Normal     Neuro/Psych negative neurological ROS  negative psych ROS   GI/Hepatic negative GI ROS, Neg liver ROS,,,  Endo/Other  diabetes, Poorly Controlled, Type 2, Insulin Dependent  Morbid obesity  Renal/GU DialysisRenal disease  negative genitourinary   Musculoskeletal negative musculoskeletal ROS (+)    Abdominal   Peds negative pediatric ROS (+)  Hematology  (+) Blood dyscrasia, anemia   Anesthesia Other Findings   Reproductive/Obstetrics negative OB ROS                             Anesthesia Physical Anesthesia Plan  ASA: 4  Anesthesia Plan: General   Post-op Pain Management: Minimal or no pain anticipated   Induction: Intravenous  PONV Risk Score and Plan: 2 and Ondansetron and Treatment may vary due to age or medical condition  Airway Management Planned: Oral ETT and Video Laryngoscope Planned  Additional Equipment:   Intra-op Plan:   Post-operative Plan: Extubation in OR  Informed Consent: I have reviewed the patients History and Physical, chart, labs and discussed the procedure including the risks, benefits and alternatives for the proposed anesthesia with the patient or authorized representative who has indicated his/her understanding and acceptance.     Dental advisory given  Plan Discussed with: CRNA and  Surgeon  Anesthesia Plan Comments: (Grade 1 view with 4 Glidescope)       Anesthesia Quick Evaluation

## 2023-07-18 NOTE — Interval H&P Note (Signed)
History and Physical Interval Note:  07/18/2023 2:22 PM  Jeffrey [name]  has presented today for surgery, with the diagnosis of ESRD.  The various methods of treatment have been discussed with the patient and family. After consideration of risks, benefits and other options for treatment, the patient has consented to  Procedure(s): INSERTION OF TUNNELED DIALYSIS CATHETER (N/A) as a surgical intervention.  The patient's history has been reviewed, patient examined, no change in status, stable for surgery.  I have reviewed the patient's chart and labs.  Questions were answered to the patient's satisfaction.     [name]

## 2023-07-18 NOTE — Progress Notes (Signed)
Received patient in bed to unit.  Alert and oriented.  Informed consent signed and in chart.   TX duration: 15 minutes  Patient tolerated well.  Transported back to the room  Alert, without acute distress.  Hand-off given to patient's nurse.   Access used: [facility] Access issues: Unable to sustain flow with interventions  Total UF removed: 100 mL Medication(s) given: None Post HD VS: see Data insert Post HD weight: Unable to obtain    07/18/23 2131  Vitals  Temp 98 F (36.7 C)  Temp Source Oral  BP 104/71  MAP (mmHg) 78  BP Location Right Leg  BP Method Automatic  Patient Position (if appropriate) Lying  Pulse Rate 93  Pulse Rate Source Monitor  ECG Heart Rate 93  Resp 14  Oxygen Therapy  SpO2 91 %  O2 Device Nasal Cannula  O2 Flow Rate (L/min) 3 L/min  Patient Activity (if Appropriate) In bed  Pulse Oximetry Type Continuous  During Treatment Monitoring  Blood Flow Rate (mL/min) 0 mL/min  Arterial Pressure (mmHg) 34.54 mmHg  Venous Pressure (mmHg) -44.64 mmHg  TMP (mmHg) 20 mmHg  Ultrafiltration Rate (mL/min) 1266 mL/min  Dialysate Flow Rate (mL/min) 299 ml/min  Duration of HD Treatment -hour(s) 0.25 hour(s)  Cumulative Fluid Removed (mL) per Treatment  -86.32   Treatment terminated d/t unable to sustain access flow with interventions. Nephrologist informed of access flow issue, confirm treatment termination at this time. Patient awakened from sleep, A&O x 4, no c/o voiced, no acute distress noted, VSS, stable for return transport to unit.    [facility]

## 2023-07-18 NOTE — Assessment & Plan Note (Addendum)
Pain out of proportion continues in LUE, poorly controlled.  Got 70 ME yesterday.  Vascular to place [facility] today and allow pt's AVG to rest. - TDC placement 8/20 with vascular - Vascular is following, appreciate their assistance   - Pain control: Scheduled Tylenol, increase Dilaudid to 1 mg Q3h PRN - Neurovascular checks q4h - Uric acid, consider starting prednisone course if gout confirmed order.  40 g prednisone - Blood Cx 8/19 in case of cellulitis - Contacted Vascular Surgery in AM to evaluate for compartment syndrome - Start prednisone 30 mg daily for possible gout, reassess for clinical improvement tomorrow

## 2023-07-18 NOTE — Anesthesia Procedure Notes (Addendum)
Procedure Name: Intubation Date/Time: 07/18/2023 3:31 PM  Performed by: Camillia Herter, CRNAPre-anesthesia Checklist: Patient identified, Emergency Drugs available, Suction available and Patient being monitored Patient Re-evaluated:Patient Re-evaluated prior to induction Oxygen Delivery Method: Circle System Utilized Preoxygenation: Pre-oxygenation with 100% oxygen Induction Type: IV induction Ventilation: Mask ventilation without difficulty Laryngoscope Size: Glidescope and 4 Grade View: Grade I Tube type: Oral Tube size: 7.5 mm Number of attempts: 1 Airway Equipment and Method: Stylet and Video-laryngoscopy Placement Confirmation: ETT inserted through vocal cords under direct vision, positive ETCO2 and breath sounds checked- equal and bilateral Secured at: 23 cm Tube secured with: Tape Dental Injury: Teeth and Oropharynx as per pre-operative assessment

## 2023-07-18 NOTE — Transfer of Care (Signed)
Immediate Anesthesia Transfer of Care Note  Patient: Jeffrey [name]  Procedure(s) Performed: INSERTION OF TUNNELED DIALYSIS CATHETER USING 23cm PALINDROME CHRONIC CATHETER (Left: Neck)  Patient Location: PACU  Anesthesia Type:General  Level of Consciousness: awake  Airway & Oxygen Therapy: Patient Spontanous Breathing and Patient connected to nasal cannula oxygen  Post-op Assessment: Report given to RN and Post -op Vital signs reviewed and stable  Post vital signs: Reviewed and stable  Last Vitals:  Vitals Value Taken Time  BP 152/67 07/18/23 1717  Temp 98   Pulse 94 07/18/23 1724  Resp 19 07/18/23 1724  SpO2 91 % 07/18/23 1724  Vitals shown include unfiled device data.  Last Pain:  Vitals:   07/18/23 1333  TempSrc: Oral  PainSc:       Patients Stated Pain Goal: 0 (07/18/23 1131)  Complications: No notable events documented.

## 2023-07-18 NOTE — Inpatient Diabetes Management (Addendum)
Inpatient Diabetes Program Recommendations  AACE/ADA: New Consensus Statement on Inpatient Glycemic Control (2015)  Target Ranges:  Prepandial:   less than 140 mg/dL      Peak postprandial:   less than 180 mg/dL (1-2 hours)      Critically ill patients:  140 - 180 mg/dL   Lab Results  Component Value Date   GLUCAP 292 (H) 07/18/2023   HGBA1C 9.3 (H) 06/01/2023    Review of Glycemic Control  Diabetes history: DM 2 Outpatient Diabetes medications: Humulin R concentrated U-500 insulin 70-125 units bid Current orders for Inpatient glycemic control:  Semglee 20 units Daily Novolog 0-6 units tid  A1c 9.3% on 7/4  Inpatient Diabetes Program Recommendations:    -  Increase Semglee to 20 units bid -  Add Novolog hs scale  Thanks,  Christena Deem RN, MSN, BC-ADM Inpatient Diabetes Coordinator Team Pager [phone] (8a-5p)

## 2023-07-18 NOTE — Progress Notes (Signed)
FMTS Brief Progress Note  S: Sleeping upon entrance to room.  Receiving hemodialysis.  States he is still having pain in his arm and it has not improved or worsened.   O: BP 131/75   Pulse 96   Temp 98 F (36.7 C) (Oral)   Resp (!) 27   Ht 5\' 5"  (1.651 m)   Wt (!) 136.4 kg   SpO2 96%   BMI 50.04 kg/m   Gen: no acute distress, sleeping upon entrance to room MSK: Diffuse TTP to LUE. 2+ radial pulses in BUE, left hand warm with intact sensation. Able to wiggle fingers.   A/P: Left arm pain - scheduled tylenol, dilaudid 1mg  Q3h PRN - neurovascular checks q4h - 30mg  prednisone for potential gout, uric acid 10.5 - blood cx 8/19  T2DM - CBG monitoring - sensitive SSI   - Orders reviewed. Labs for AM ordered (iron/TIBC, mag, RFP), which was adjusted as needed.  - If condition changes, plan includes consult vascular if concerned for compartment syndrome.   Para March, DO 07/18/2023, 10:11 PM PGY-1, [facility] Night Resident  Please page [phone] with questions.

## 2023-07-18 NOTE — Plan of Care (Signed)

## 2023-07-18 NOTE — Consult Note (Signed)
Renal Service Consult Note [facility]  Jeffrey [name] 07/18/2023 Maree Krabbe, MD Requesting Physician: Dr. McDiarmid  Reason for Consult: ESRD pt w/ L arm pain HPI: The patient is a 62 y.o. year-old w/ PMH as below who presented to ED for LUE pain, started shortly after his last dialysis was started. No chills, fevers.  In ED CTA of arm showed mild perigraft stranding, no hematoma or fluid collection. Pt admitted and seen by VVS. No signs of steal syndrome. They plan on placing TDC today and rest the AV graft. We are asked to see for dialysis.   Pt seen in room, c/o L arm pain from upper arm down to the hand and wrist. No fevers, chills or drainage. Last HD was 8/17 done at the hospital here. Pt is w/o an OP HD unit and is getting dialysis through the hospital.    ROS - denies CP, no joint pain, no HA, no blurry vision, no rash, no diarrhea, no nausea/ vomiting, no dysuria, no difficulty voiding   Past Medical History  Past Medical History:  Diagnosis Date   Altered mental status 05/04/2021   Arthritis    Blood transfusion without reported diagnosis    CHF (congestive heart failure) (HCC)    COPD (chronic obstructive pulmonary disease) (HCC)    Depression    Diabetes mellitus without complication (HCC)    type 2   Dyspnea    ESRD on hemodialysis (HCC)    Tues Thurs Sat   GERD (gastroesophageal reflux disease)    protonix   Heart murmur    as a child, no problems   Hypertension    Myocardial infarction (HCC)    PTSD (post-traumatic stress disorder)    Sepsis (HCC)    Sleep apnea    occasional uses CPAP   Uses powered wheelchair    Past Surgical History  Past Surgical History:  Procedure Laterality Date   A/V FISTULAGRAM N/A 08/30/2021   Procedure: A/V FISTULAGRAM;  Surgeon: Maeola Harman, MD;  Location: [facility] INVASIVE CV LAB;  Service: Cardiovascular;  Laterality: N/A;   A/V FISTULAGRAM Right 07/28/2022   Procedure: A/V Fistulagram;  Surgeon:  Maeola Harman, MD;  Location: [facility] INVASIVE CV LAB;  Service: Cardiovascular;  Laterality: Right;   A/V FISTULAGRAM Right 09/05/2022   Procedure: A/V Fistulagram;  Surgeon: Maeola Harman, MD;  Location: [facility] INVASIVE CV LAB;  Service: Cardiovascular;  Laterality: Right;   A/V FISTULAGRAM Right 10/31/2022   Procedure: A/V Fistulagram;  Surgeon: Maeola Harman, MD;  Location: [facility] INVASIVE CV LAB;  Service: Cardiovascular;  Laterality: Right;   A/V SHUNT INTERVENTION Right 10/31/2022   Procedure: A/V SHUNT INTERVENTION;  Surgeon: Maeola Harman, MD;  Location: [facility] INVASIVE CV LAB;  Service: Cardiovascular;  Laterality: Right;   A/V SHUNTOGRAM N/A 11/08/2021   Procedure: A/V SHUNTOGRAM;  Surgeon: Maeola Harman, MD;  Location: [facility] INVASIVE CV LAB;  Service: Cardiovascular;  Laterality: N/A;   AMPUTATION Left 04/03/2021   Procedure: LEFT ABOVE-THE-KNEE AMPUTATION;  Surgeon: Terance Hart, MD;  Location: [facility] OR;  Service: Orthopedics;  Laterality: Left;   AV FISTULA PLACEMENT Right 08/12/2021   Procedure: INSERTION OF ARTERIOVENOUS (AV) GORE-TEX GRAFT RIGHT ARM;  Surgeon: Nada Libman, MD;  Location: [facility] OR;  Service: Vascular;  Laterality: Right;   AV FISTULA PLACEMENT Left 04/07/2023   Procedure: ARTERIOVENOUS (AV) FISTULA GRAFT USING GORETEX STRETCH (4-7MM);  Surgeon: Chuck Hint, MD;  Location: [facility] OR;  Service: Vascular;  Laterality: Left;   INSERTION OF DIALYSIS CATHETER N/A 11/25/2022   Procedure: INSERTION OF DIALYSIS CATHETER;  Surgeon: Lorin Glass, MD;  Location: [facility] ENDOSCOPY;  Service: Pulmonary;  Laterality: N/A;   INSERTION OF DIALYSIS CATHETER N/A 03/03/2023   Procedure: INSERTION OF TUNNELED DIALYSIS CATHETER;  Surgeon: Maeola Harman, MD;  Location: [facility] OR;  Service: Vascular;  Laterality: N/A;   INSERTION OF DIALYSIS CATHETER Left 04/07/2023   Procedure: INSERTION OF DIALYSIS CATHETER USING PALINDROME  28CM  PRECISION CHRONIC CATHETER KIT;  Surgeon: Chuck Hint, MD;  [facility] OR;  Service: Vascular;  Laterality: Left;   IR REMOVAL TUN CV CATH W/O FL  03/30/2021   JOINT REPLACEMENT     Bilateral knees   LIGATION ARTERIOVENOUS GORTEX GRAFT Right 03/03/2023   Procedure: LIGATION RIGHT ARM ARTERIOVENOUS GORTEX GRAFT;  Surgeon: Maeola Harman, MD;  Location: [facility] OR;  Service: Vascular;  Laterality: Right;   PERIPHERAL VASCULAR BALLOON ANGIOPLASTY Right 08/30/2021   Procedure: PERIPHERAL VASCULAR BALLOON ANGIOPLASTY;  Surgeon: Maeola Harman, MD;  Location: [facility] INVASIVE CV LAB;  Service: Cardiovascular;  Laterality: Right;   PERIPHERAL VASCULAR BALLOON ANGIOPLASTY Right 07/28/2022   Procedure: PERIPHERAL VASCULAR BALLOON ANGIOPLASTY;  Surgeon: Maeola Harman, MD;  Location: [facility] INVASIVE CV LAB;  Service: Cardiovascular;  Laterality: Right;  AVF   PERIPHERAL VASCULAR INTERVENTION  11/08/2021   Procedure: PERIPHERAL VASCULAR INTERVENTION;  Surgeon: Maeola Harman, MD;  Location: [facility] INVASIVE CV LAB;  Service: Cardiovascular;;  Central rt arm fistula   UPPER EXTREMITY VENOGRAPHY Right 08/10/2021   Procedure: UPPER EXTREMITY VENOGRAPHY;  Surgeon: Nada Libman, MD;  Location: [facility] INVASIVE CV LAB;  Service: Cardiovascular;  Laterality: Right;   Family History History reviewed. No pertinent family history. Social History  reports that he quit smoking about 12 years ago. His smoking use included cigarettes. He has never used smokeless tobacco. He reports current alcohol use. He reports that he does not currently use drugs. Allergies  Allergies  Allergen Reactions   Actos [Pioglitazone] Anaphylaxis and Rash   Dexmedetomidine Nausea And Vomiting and Other (See Comments)    (Precedex) Dose-limiting bradycardia     Ibuprofen Shortness Of Breath, Swelling and Other (See Comments)    Pt tolerates aspirin    Tomato Anaphylaxis and Other (See Comments)    Only  allergic to RAW tomatoes   Wellbutrin [Bupropion] Swelling   Home medications Prior to Admission medications   Medication Sig Start Date End Date Taking? Authorizing Provider  amLODipine (NORVASC) 10 MG tablet Take 10 mg by mouth every morning. 04/17/20   [provider]  ASPIRIN LOW DOSE 81 MG EC tablet Take 81 mg by mouth in the morning. 02/22/21   [provider]  atorvastatin (LIPITOR) 40 MG tablet Take 40 mg by mouth in the morning. 02/09/21   [provider]  calcium acetate (PHOSLO) 667 MG capsule Take 2 capsules (1,334 mg total) by mouth with breakfast, with lunch, and with evening meal. 04/10/21   Calvert Cantor, MD  Calcium Carbonate Antacid (CALCIUM CARBONATE, DOSED IN MG ELEMENTAL CALCIUM,) 1250 MG/5ML SUSP Take 5 mLs (500 mg of elemental calcium total) by mouth every 6 (six) hours as needed for indigestion. 04/08/23   Rhetta Mura, MD  carvedilol (COREG) 12.5 MG tablet Take 12.5 mg by mouth 2 (two) times daily with a meal. 04/17/20   [provider]  Cholecalciferol (VITAMIN D) 50 MCG (2000 UT) CAPS Take 2,000 Units by mouth daily.    [provider]  clopidogrel (PLAVIX) 75 MG tablet Take 1 tablet (75 mg total) by mouth daily. 11/02/22   Rhyne, Ames Coupe, PA-C  cyclobenzaprine (FLEXERIL) 10 MG tablet Take 10 mg by mouth 3 (three) times daily as needed for muscle spasms. 12/29/21   [provider]  diazepam (VALIUM) 5 MG tablet Take 5 mg by mouth 2 (two) times daily as needed for muscle spasms. 12/09/21   [provider]  insulin regular human CONCENTRATED (HUMULIN R U-500 KWIKPEN) 500 UNIT/ML KwikPen Inject 45 Units into the skin daily with supper. Patient taking differently: Inject 70-125 Units into the skin 2 (two) times daily with a meal. 09/04/21 08/30/24  [name], MD  metolazone (ZAROXOLYN) 10 MG tablet Take 10 mg by mouth daily. 07/13/21   [provider]  nortriptyline (PAMELOR) 10 MG capsule Take  20 mg by mouth at bedtime. 10/23/19 08/30/24  [provider]  pantoprazole (PROTONIX) 40 MG tablet Take 40 mg by mouth daily before breakfast. 02/09/21   [provider]  pregabalin (LYRICA) 75 MG capsule Take 1 capsule (75 mg total) by mouth See admin instructions. Daily.  Give after dialysis on dialysis days. 01/21/23   [name], MD  sertraline (ZOLOFT) 100 MG tablet Take 100 mg by mouth in the morning. 02/22/21   [provider]  tamsulosin (FLOMAX) 0.4 MG CAPS capsule Take 1 capsule (0.4 mg total) by mouth daily. 11/10/21   [name], MD  torsemide (DEMADEX) 20 MG tablet Take 120 mg by mouth 2 (two) times daily.    [provider]  traZODone (DESYREL) 150 MG tablet Take 150 mg by mouth at bedtime. 12/09/21   [provider]     Vitals:   07/18/23 0240 07/18/23 0332 07/18/23 0605 07/18/23 0742  BP:  (!) 151/128  (!) 147/60  Pulse:  (!) 107  62  Resp:  18  18  Temp:  98.3 F (36.8 C)  99.5 F (37.5 C)  TempSrc:  Oral    SpO2:  (!) 88% 99% 93%  Weight: (!) 136.4 kg     Height: 5\' 5"  (1.651 m)      Exam Gen alert, no distress No rash, cyanosis or gangrene Sclera anicteric, throat clear  No jvd or bruits Chest clear bilat to bases, no rales/ wheezing RRR no MRG Abd soft ntnd no mass or ascites +bs GU defer MS no joint effusions or deformity Ext LLE amputation, no edema L stump or RLE Neuro is alert, Ox 3 , nf    LUA AVG+bruit, tender left arm from wrist to shoulder No drainage or abscess formation      Home meds include - aspirin, norvasc, lipitor, phoslo 2 ac, coreg 12.5 bid, plavix, valium, metolazone 10, nortriptyline, pantoprazole, pregabalin 75 every day, sertraline, tamsulosin, torsemide 120 bid, trazodone, prns     OP HD: Last OP HD orders (dec 2023):  3:15h  129kg  RUE AVG   Heparin 2000    - last hep B labs done here on --> 07/01/23 - pt typically comes on Tu, Th and Sat for hospital HD   Assessment/ Plan: L arm  pain - w/u in progress per pmd and VVS.  HD access - VVS planning to place [facility] and rest L AVG ESRD - does not has OP HD unit. HD after [facility] placed.  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 Get fe/tibc.  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 Will start rocaltrol at 0.25 mcg daily and give IV Ca++ 2 gm and give him CaCO3 500 tid for now.       Vinson Moselle  MD [facility] 07/18/2023, 10:18 AM  Recent Labs  Lab 07/17/23 0830 07/17/23 2359  HGB 9.5*  --   ALBUMIN 3.5 3.4*  CALCIUM 7.1* 6.9*  PHOS  --  6.4*  CREATININE 5.21* 5.31*  K 3.5 3.5   Inpatient medications:  acetaminophen  650 mg Oral Q6H   amLODipine  10 mg Oral q morning   aspirin EC  81 mg Oral Daily   atorvastatin  40 mg Oral Daily   calcium acetate  1,334 mg Oral TID with meals   carvedilol  12.5 mg Oral BID WC   Chlorhexidine Gluconate Cloth  6 each Topical Q0600   clopidogrel  75 mg Oral Daily   heparin  5,000 Units Subcutaneous Q8H   insulin aspart  0-6 Units Subcutaneous TID WC   insulin glargine-yfgn  20 Units Subcutaneous Daily   metolazone  10 mg Oral Daily   nortriptyline  20 mg Oral QHS   pantoprazole  40 mg Oral QAC breakfast   pregabalin  75 mg Oral Daily   sertraline  100 mg Oral Daily   tamsulosin  0.4 mg Oral Daily   torsemide  120 mg Oral BID   traZODone  150 mg Oral QHS    diazepam, HYDROmorphone (DILAUDID) injection

## 2023-07-18 NOTE — Progress Notes (Signed)
Daily Progress Note Intern Pager: [phone]  Patient name: Jeffrey [name] Medical record number: [id number] Date of birth: 1961-09-16 Age: 62 y.o. Gender: male  Primary Care Provider: Knox Royalty, MD Consultants: Vascular Surgery, [place] for HD Code Status: FULL  Pt Overview and Major Events to Date:  8/19 - Admitted, having severe panic portion in LUE 8/20 - TDC placement  Assessment and Plan: Jeffrey [name] is a 62 y.o. male with a pertinent PMH of ESRD on HD T/Th/Sat who presented with LUE pain s/p HD and was admitted for [facility] placement, currently awaiting [facility] placement by Vascular Surgery and then planning to receive HD.  Concern for compartment syndrome or steal syndrome does exist, LLE radial pulse difficult to palpate with edema and pain.  Did obtain blood cultures in case of atypical presentation of cellulitis, patient does remain afebrile and leukocytosis on admission.  Gout is also on differential, did obtain uric acid and did start prednisone in the event of unusual presentation, however less concern for gout given entire arm edema. Assessment & Plan Left arm pain Pain out of proportion continues in LUE, poorly controlled.  Got 70 ME yesterday.  Vascular to place [facility] today and allow pt's AVG to rest. - TDC placement 8/20 with vascular - Vascular is following, appreciate their assistance   - Pain control: Scheduled Tylenol, increase Dilaudid to 1 mg Q3h PRN - Neurovascular checks q4h - Uric acid, consider starting prednisone course if gout confirmed order.  40 g prednisone - Blood Cx 8/19 in case of cellulitis - Contacted Vascular Surgery in AM to evaluate for compartment syndrome - Start prednisone 30 mg daily for possible gout, reassess for clinical improvement tomorrow ESRD on dialysis [facility]) T/Th/Sat dialysis patient, coordinating with HD - AM RFP and Mag daily Type 2 diabetes mellitus with hyperlipidemia (HCC) T2DM on 70-125 units regular insulin BID with meals at  home.  CBGs 233>292 fasting, received 20U long acting and 3 units short acting (is NPO) in past 24 hours.  Concern for exacerbation of hyperglycemia with addition of prednisone. - Will reevaluate this afternoon, likely increase to 20U semglee BID - Increase to sensitive SSI from very sensitive SSI - CBG monitoring  Chronic and Stable Problems:  PVD - ASA, Plavix, Lipitor CHF - Continue Coreg, torsemide, metolazone at home doses Chronic Hypoxic Respiratory Failure - On home 3L HTN - Continue home amlodipine and coreg PTSD - Continue home Trazodone, Valium Chronic Pain - Lyrica after HD, Nortriptyline at bedtime, Valium PRN  FEN/GI: Regular diet after TDC PPx: Heparin d/t ESRD Dispo: Pending PT recommendations  pending clinical improvement . Barriers include TDC placement, HD success.  Subjective:  This morning, patient reports excruciating pain in left extremity, states he feels that his fingers are cold.  Describes pain as electrical shooting up his arm, also extreme pain with even gentle touch.  Reports that Dilaudid doses are helping for about 20 minutes, pain returns severely soon after administration.  Provides context that his pain did not start immediately after his HD on Saturday but rather after he went home and took a nap, was awoken from his sleep by the severe pain at that time.  Objective: Temp:  [98.2 F (36.8 C)-99.6 F (37.6 C)] 98.3 F (36.8 C) (08/20 0332) Pulse Rate:  [98-107] 107 (08/20 0332) Resp:  [16-20] 18 (08/20 0332) BP: (116-192)/(63-128) 151/128 (08/20 0332) SpO2:  [88 %-100 %] 99 % (08/20 0605) Weight:  [136.4 kg] 136.4 kg (08/20 0240)  Physical Exam: General: Adult male, large body habitus, resting in bed, in acute pain, alert and at baseline. Cardiovascular: Tachycardic, regular rhythm. Normal S1/S2. No murmurs, rubs, or gallops appreciated. 2+ radial pulses. Pulmonary: Clear bilaterally to ascultation. No increased WOB, no accessory muscle usage on 3 L  [place]. No wheezes, rales, or crackles. Skin: Warm and dry.  Hypertrophic scars visible across chest and arms. Extremities: Significant edema in entire left upper extremity, wedding band trace noted on fourth phalange.  Distal radial pulse weakly palpable on left extremity, physical exam limited by severe pain.  Fingers cool to touch.  L AKA, mild peripheral edema in RLE.  Laboratory: Most recent CBC Lab Results  Component Value Date   WBC 6.0 07/17/2023   HGB 9.5 (L) 07/17/2023   HCT 31.7 (L) 07/17/2023   MCV 87.6 07/17/2023   PLT 111 (L) 07/17/2023   Most recent BMP    Latest Ref Rng & Units 07/17/2023   11:59 PM  BMP  Glucose 70 - 99 mg/dL 409   BUN 8 - 23 mg/dL 83   Creatinine 8.11 - 1.24 mg/dL 9.14   Sodium 782 - 956 mmol/L 133   Potassium 3.5 - 5.1 mmol/L 3.5   Chloride 98 - 111 mmol/L 89   CO2 22 - 32 mmol/L 28   Calcium 8.9 - 10.3 mg/dL 6.9     Other pertinent labs: - GFR 11 (near baseline) - Phosphate 6.4 - Anion gap 16  New Imaging/Diagnostic Tests: - CTA LUE: Potential narrowing of venous anastomosis, small amount of perigraft stranding, but patent left AV graft, however degraded exam for evaluating vasculature  Jeffrey Peaster Sharion Dove, MD 07/18/2023, 7:08 AM [facility]  FMTS Intern pager: [phone], text pages welcome Secure chat group [facility] Teaching Service

## 2023-07-18 NOTE — Op Note (Signed)
    NAME: Jeffrey [name]    MRN: [id number] DOB: 20-Aug-1961    DATE OF OPERATION: 07/18/2023  PREOP DIAGNOSIS:    End-stage renal disease  POSTOP DIAGNOSIS:    Same  PROCEDURE:    Ultrasound-guided access to left internal jugular Placement of 28 cm left IJ tunneled dialysis catheter  SURGEON: Di Kindle. Edilia Bo, MD  ANESTHESIA: General  EBL: Normal  INDICATIONS:    Jeffrey [name] is a 62 y.o. male who has a left upper arm access that became painful after dialysis last Saturday.  He would not allow them to continue to use the arm and therefore placement of a catheter was recommended.  FINDINGS:   The right IJ appeared to be occluded by ultrasound.  The left IJ appeared to be patent.  The patient has a very short thick neck and challenging anatomy.  TECHNIQUE:   Patient was taken to the operating room and received a general anesthetic.  I had looked at the neck on both sides.  On the right side there IJ appeared to be occluded.  The left IJ appeared to be patent.  The neck and upper chest were prepped and draped in usual sterile fashion.  Under ultrasound guidance, after the skin was anesthetized, I cannulated the left IJ with a micropuncture needle and a micropuncture sheath was introduced over a wire.  There was some tortuosity to the brachiocephalic vein and superior vena cava therefore I used an angled Glidewire to direct the wire down into the right atrium.  Given the tortuosity I felt that we would need a stiffer wire so I exchanged this for a Glidewire advantage over a Berenstein catheter.  In addition, initially the wire caught on a strut of the stent in the right brachiocephalic vein.  I used the Berenstein catheter and an angled glide to steer away from the stent.  The 28 cm catheter was then brought through a tunnel to the IJ cannulation site.  I then dilated the tract over the wire and the dilator and peel-away sheath were advanced over the wire.  I used the stylette so  that I could advance the catheter over the wire through the peel-away sheath down into the right atrium.  The stylets were removed.  I spent a long time trying to find a position where the catheter seem to work the best.  There was also issues with kinking in the neck because of his body habitus but ultimately I was able to get rid of the kink.  The venous port/blue port seems to withdraw very easily worse the arterial port/red port is sometimes sluggish.  However despite multiple manipulations no other positions seem to make a difference.  Catheter was secured at its exit site with a 3-0 nylon suture.  The IJ cannulation site was closed with 4-0 Monocryl.  Dermabond was applied.  A sterile dressing was applied.  The patient tolerated procedure well was transferred to the recovery room in stable condition.  All needle and sponge counts were correct.  [name], MD, FACS [facility]  DATE OF DICTATION:   07/18/2023

## 2023-07-19 ENCOUNTER — Encounter (HOSPITAL_COMMUNITY): Payer: Self-pay | Admitting: Vascular Surgery

## 2023-07-19 DIAGNOSIS — D631 Anemia in chronic kidney disease: Secondary | ICD-10-CM

## 2023-07-19 DIAGNOSIS — N186 End stage renal disease: Secondary | ICD-10-CM

## 2023-07-19 DIAGNOSIS — Z992 Dependence on renal dialysis: Secondary | ICD-10-CM | POA: Diagnosis not present

## 2023-07-19 DIAGNOSIS — E790 Hyperuricemia without signs of inflammatory arthritis and tophaceous disease: Secondary | ICD-10-CM | POA: Insufficient documentation

## 2023-07-19 DIAGNOSIS — M79602 Pain in left arm: Secondary | ICD-10-CM | POA: Diagnosis not present

## 2023-07-19 LAB — RENAL FUNCTION PANEL
Albumin: 3 g/dL — ABNORMAL LOW (ref 3.5–5.0)
Anion gap: 16 — ABNORMAL HIGH (ref 5–15)
BUN: 91 mg/dL — ABNORMAL HIGH (ref 8–23)
CO2: 26 mmol/L (ref 22–32)
Calcium: 6.7 mg/dL — ABNORMAL LOW (ref 8.9–10.3)
Chloride: 89 mmol/L — ABNORMAL LOW (ref 98–111)
Creatinine, Ser: 6.33 mg/dL — ABNORMAL HIGH (ref 0.61–1.24)
GFR, Estimated: 9 mL/min — ABNORMAL LOW (ref >60)
Glucose, Bld: 208 mg/dL — ABNORMAL HIGH (ref 70–99)
Phosphorus: 6.8 mg/dL — ABNORMAL HIGH (ref 2.5–4.6)
Potassium: 3.6 mmol/L (ref 3.5–5.1)
Sodium: 131 mmol/L — ABNORMAL LOW (ref 135–145)

## 2023-07-19 LAB — FERRITIN: Ferritin: 586 ng/mL — ABNORMAL HIGH (ref 24–336)

## 2023-07-19 LAB — GLUCOSE, CAPILLARY
Glucose-Capillary: 212 mg/dL — ABNORMAL HIGH (ref 70–99)
Glucose-Capillary: 314 mg/dL — ABNORMAL HIGH (ref 70–99)
Glucose-Capillary: 357 mg/dL — ABNORMAL HIGH (ref 70–99)

## 2023-07-19 LAB — MAGNESIUM: Magnesium: 1.3 mg/dL — ABNORMAL LOW (ref 1.7–2.4)

## 2023-07-19 LAB — IRON AND TIBC
Iron: 20 ug/dL — ABNORMAL LOW (ref 45–182)
Saturation Ratios: 9 % — ABNORMAL LOW (ref 17.9–39.5)
TIBC: 217 ug/dL — ABNORMAL LOW (ref 250–450)
UIBC: 197 ug/dL

## 2023-07-19 MED ORDER — HEPARIN SODIUM (PORCINE) 1000 UNIT/ML IJ SOLN
INTRAMUSCULAR | Status: AC
  Administered 2023-07-19: 2000 [IU]
  Filled 2023-07-19: qty 2

## 2023-07-19 MED ORDER — HEPARIN SODIUM (PORCINE) 1000 UNIT/ML IJ SOLN
INTRAMUSCULAR | Status: AC
  Administered 2023-07-19: 1000 [IU]
  Filled 2023-07-19: qty 5

## 2023-07-19 MED ORDER — CHLORHEXIDINE GLUCONATE CLOTH 2 % EX PADS
6.0000 | MEDICATED_PAD | Freq: Every day | CUTANEOUS | Status: DC
  Administered 2023-07-19: 6 via TOPICAL

## 2023-07-19 MED ORDER — CHLORHEXIDINE GLUCONATE CLOTH 2 % EX PADS
6.0000 | MEDICATED_PAD | Freq: Every day | CUTANEOUS | Status: DC
  Administered 2023-07-21 – 2023-07-23 (×3): 6 via TOPICAL

## 2023-07-19 MED ORDER — SODIUM CHLORIDE 0.9 % IV SOLN
250.0000 mg | Freq: Once | INTRAVENOUS | Status: DC
  Filled 2023-07-19: qty 20

## 2023-07-19 MED ORDER — SODIUM CHLORIDE 0.9 % IV SOLN
250.0000 mg | INTRAVENOUS | Status: DC
  Administered 2023-07-20 – 2023-07-22 (×2): 250 mg via INTRAVENOUS
  Filled 2023-07-19 (×3): qty 20

## 2023-07-19 MED ORDER — DARBEPOETIN ALFA 40 MCG/0.4ML IJ SOSY
40.0000 ug | PREFILLED_SYRINGE | INTRAMUSCULAR | Status: DC
  Administered 2023-07-19: 40 ug via SUBCUTANEOUS
  Filled 2023-07-19: qty 0.4

## 2023-07-19 NOTE — Anesthesia Postprocedure Evaluation (Signed)
Anesthesia Post Note  Patient: Jeffrey [name]  Procedure(s) Performed: INSERTION OF TUNNELED DIALYSIS CATHETER USING 23cm PALINDROME CHRONIC CATHETER (Left: Neck)     Patient location during evaluation: PACU Anesthesia Type: General Level of consciousness: awake Pain management: pain level controlled Vital Signs Assessment: post-procedure vital signs reviewed and stable Respiratory status: spontaneous breathing, nonlabored ventilation and respiratory function stable Cardiovascular status: blood pressure returned to baseline and stable Postop Assessment: no apparent nausea or vomiting Anesthetic complications: no  No notable events documented.  Last Vitals:  Vitals:   07/18/23 2146 07/19/23 0444  BP: 131/75 134/76  Pulse: 96 96  Resp: (!) 27 19  Temp:  37.2 C  SpO2: 96% 97%    Last Pain:  Vitals:   07/18/23 2330  TempSrc:   PainSc: 10-Worst pain ever                 Chasiti Waddington P Osha Rane

## 2023-07-19 NOTE — Assessment & Plan Note (Addendum)
Pain still present, but better controlled now.  Got 57.5 ME yesterday.  Still unclear etiology.  Vascular suspects patient's nerve may have been hit instead of vein at HD prior to admission. - s/p [facility] placement 8/20 with Vascular, appreciate assistance - Pain control: Scheduled Tylenol, Dilaudid 1 mg Q3h PRN - Neurovascular checks q4h - Blood Cx 8/19 NGTD@1d  - Continue prednisone 30 mg daily until 8/22

## 2023-07-19 NOTE — TOC CM/SW Note (Signed)
Left VM for patient to return my call

## 2023-07-19 NOTE — Assessment & Plan Note (Addendum)
T2DM on 70-125 units regular insulin BID with meals at home.  CBGs 208>212 fasting, received 20U long acting and 6 units short acting in past 24 hours.  Concern for exacerbation of hyperglycemia with addition of prednisone, but holding stable now. - Will reevaluate this afternoon, increase to 20U semglee BID if hyperglycemic - Sensitive SSI - CBG monitoring Q4h

## 2023-07-19 NOTE — Progress Notes (Signed)
[facility] Progress Note  Subjective: seen in room, no c/o's   Vitals:   07/19/23 1455 07/19/23 1458 07/19/23 1530 07/19/23 1600  BP: (!) 140/78 138/78 (!) 143/78 122/62  Pulse: 92 92 91 86  Resp: 14 14 16    Temp: 97.8 F (36.6 C)     TempSrc:      SpO2: 98% 98% 100% 99%  Weight:      Height:        Exam: Gen alert, no distress No jvd or bruits Chest clear bilat to bases RRR no MRG Abd soft ntnd no mass or ascites +bs Ext no edema of legs Neuro is alert, Ox 3 , nf    LUA AVG+bruit, tender left arm from wrist to shoulder      Home meds include - aspirin, norvasc, lipitor, phoslo 2 ac, coreg 12.5 bid, plavix, valium, metolazone 10, nortriptyline, pantoprazole, pregabalin 75 every day, sertraline, tamsulosin, torsemide 120 bid, trazodone, prns        OP HD: Last OP HD orders (dec 2023):  3:15h  129kg  RUE AVG   Heparin 2000    - last hep B labs done here on --> 07/01/23 - pt typically comes on Tu, Th and Sat for hospital HD     Assessment/ Plan: L arm pain - w/u in progress per pmd and VVS.  HD access - VVS placed [facility] 8/20, didn't work last night but seems to be running well today. Resting L AVG ESRD - does not has OP HD unit. HD in progress today. Short HD tomorrow as well.  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 Will start rocaltrol at 0.25 mcg daily, give IV Ca++ 2 gm  x 1 and give him CaCO3 500 tid for now. Phos is a bit high, cont phoslo.    Vinson Moselle MD  [facility] 07/19/2023, 5:15 PM  Recent Labs  Lab 07/17/23 0830 07/17/23 2359 07/18/23 1333 07/19/23 0312  HGB 9.5*  --  10.2*  --   ALBUMIN 3.5 3.4*  --  3.0*  CALCIUM 7.1* 6.9*  --  6.7*  PHOS  --  6.4*  --  6.8*  CREATININE 5.21* 5.31* 5.70* 6.33*  K 3.5 3.5 3.6 3.6   Recent Labs  Lab 07/19/23 0312  IRON 20*  TIBC 217*  FERRITIN 586*   Inpatient medications:  acetaminophen  650 mg Oral Q6H    amLODipine  10 mg Oral q morning   aspirin EC  81 mg Oral Daily   atorvastatin  40 mg Oral Daily   calcitRIOL  0.25 mcg Oral Daily   calcium acetate  1,334 mg Oral TID with meals   calcium carbonate  1 tablet Oral TID   carvedilol  12.5 mg Oral BID WC   Chlorhexidine Gluconate Cloth  6 each Topical Q0600   Chlorhexidine Gluconate Cloth  6 each Topical Q0600   clopidogrel  75 mg Oral Daily   darbepoetin (ARANESP) injection - DIALYSIS  40 mcg Subcutaneous Q Wed-1800   heparin  5,000 Units Subcutaneous Q8H   heparin sodium (porcine)       insulin aspart  0-9 Units Subcutaneous TID WC   insulin glargine-yfgn  20 Units Subcutaneous Daily   metolazone  10 mg Oral Daily   nortriptyline  20 mg Oral QHS   pantoprazole  40 mg Oral QAC breakfast  predniSONE  30 mg Oral Q breakfast   pregabalin  75 mg Oral Daily   sertraline  100 mg Oral Daily   tamsulosin  0.4 mg Oral Daily   torsemide  120 mg Oral BID   traZODone  150 mg Oral QHS    [START ON 07/20/2023] ferric gluconate (FERRLECIT) IVPB     diazepam, heparin sodium (porcine), HYDROmorphone (DILAUDID) injection

## 2023-07-19 NOTE — Progress Notes (Addendum)
VASCULAR SURGERY:  He underwent a very challenging placement of a left IJ tunneled dialysis catheter yesterday.  After much manipulation I was ultimately able to get the catheter in a position with no kink in the neck and he had excellent return from the venous port with more sluggish return from the arterial port.  Apparently he had some difficulty with the catheter last night but they will try again today.  I think the best return is from the venous port and may work best when he is flat.  The only alternative would be to place a femoral catheter if he still will not let us use his fistula.  Of note his fistula does have an excellent thrill.  Based on his history I suspect on dialysis Saturday really vein may have hit a nerve.  He did have a duplex of the fistula which did not identify any problems.  Cari Caraway, MD 7:22 AM

## 2023-07-19 NOTE — Progress Notes (Signed)
   07/19/23 1844  Vitals  Temp 98.1 F (36.7 C)  Pulse Rate 87  Resp 16  BP 120/82  SpO2 100 %  O2 Device Nasal Cannula  Oxygen Therapy  O2 Flow Rate (L/min) 3 L/min  Patient Activity (if Appropriate) In bed  Pulse Oximetry Type Continuous  Post Treatment  Dialyzer Clearance Clear  Hemodialysis Intake (mL) 0 mL  Liters Processed 73.8  Fluid Removed (mL) 3000 mL  Tolerated HD Treatment Yes  Post-Hemodialysis Comments Pt tolerated Procedure without difficulties. VSS and UF goal met. Admin Medication and report called to [facility] Bedside RN  During Treatment Monitoring  Duration of HD Treatment -hour(s) 3.5 hour(s)   Received patient in bed to unit.  Alert and oriented.  Informed consent signed and in chart.   TX duration:3.5  Patient tolerated well.  Transported back to the room  Alert, without acute distress.  Hand-off given to patient's nurse.   Access used: Yes Access issues: No, after reversing lines and turning BFR down to 350 instead of 400  Total UF removed: 3000 Medication(s) given: See MAR Post HD VS: See Above Grid Post HD weight: 128.5 kg   [facility]

## 2023-07-19 NOTE — Progress Notes (Signed)
Daily Progress Note Intern Pager: [phone]  Patient name: Jeffrey [name] Medical record number: [id number] Date of birth: Aug 15, 1961 Age: 62 y.o. Gender: male  Primary Care Provider: Knox Royalty, MD Consultants: Vascular Surgery, Nephrology for HD Code Status: FULL  Pt Overview and Major Events to Date:  8/19 - Admitted, having severe pain out of proportion in LUE 8/20 - Difficult tunneled internal jugular cath placement, HD with poorly sustained flow  Assessment and Plan: ELIGIO [name] is a 62 y.o. male with a pertinent PMH of ESRD on HD T/Th/Sat who presented with LUE pain s/p HD and was admitted for [facility] placement, now s/p tunneled internal jugular catheter placement, but with poorly sustained flow in HD on 8/20.  Will be important to ensure consistent way to get HD prior to discharge.  LUE pain is improved and patient does have palpable pulse, warm fingers.  NG in blood Cx at 24 hours, uric acid 10.5.  Given improvement after starting prednisone, possible gout component, will continue treatment 2 more days. Assessment & Plan Left arm pain Pain still present, but better controlled now.  Got 57.5 ME yesterday.  Still unclear etiology.  Vascular suspects patient's nerve may have been hit instead of vein at HD prior to admission. - s/p [facility] placement 8/20 with Vascular, appreciate assistance - Pain control: Scheduled Tylenol, Dilaudid 1 mg Q3h PRN - Neurovascular checks q4h - Blood Cx 8/19 NGTD@1d  - Continue prednisone 30 mg daily until 8/22 ESRD on dialysis [facility]) Underwent placement of internal jugular tunneled cath 8/20, Vascular note states it was challenging and best return would be from fistula, if patient allows use.  Did have some difficulty with new catheter in HD yesterday and only 100 mL UF removed, will attempt again today.  T/Th/Sat HD schedule. - AM RFP and Mag daily Type 2 diabetes mellitus with hyperlipidemia (HCC) T2DM on 70-125 units regular insulin BID with meals at  home.  CBGs 208>212 fasting, received 20U long acting and 6 units short acting in past 24 hours.  Concern for exacerbation of hyperglycemia with addition of prednisone, but holding stable now. - Will reevaluate this afternoon, increase to 20U semglee BID if hyperglycemic - Sensitive SSI - CBG monitoring Q4h Anemia due to chronic kidney disease, on chronic dialysis [facility]) Patient with low TIBC and iron, elevated ferritin >500 in the setting of chronic dialysis.  Suspect patient needs EPO stimulating agents.  Given ferritin >500 in ESRD, literature review shows that iron supplementation does not decrease EPO requirement or increase Hgb. - Follow up with Nephrology about EPO agents  Chronic and Stable Problems: PVD - ASA, Plavix, Lipitor CHF - Continue Coreg, torsemide, metolazone at home doses Chronic Hypoxic Respiratory Failure - On home 3L HTN - Continue home amlodipine and coreg PTSD - Continue home Trazodone, Valium Chronic Pain - Lyrica after HD, Nortriptyline at bedtime, Valium PRN  FEN/GI: Regular diet PPx: Heparin d/t ESRD Dispo: Pending PT recommendations  pending clinical improvement . Barriers include TDC placement, HD success.  Subjective:  This morning, patient is sleeping in bed, snoring.  When awoken he appears a bit sleepy but is responsive and converses appropriately.  States that he is still having left upper extremity pain, is unsure if it is better or not.  However, he is able to move his hand now and does not react with tears when left extremity is palpated as before.  States he is ready for repeat HD.  Of note, patient's oxygen, off at night and  was replaced during exam.  Denies shortness of breath, chest pain.  Objective: Temp:  [98 F (36.7 C)-99.5 F (37.5 C)] 98.9 F (37.2 C) (08/21 0444) Pulse Rate:  [62-97] 96 (08/21 0444) Resp:  [13-27] 19 (08/21 0444) BP: (104-152)/(54-86) 134/76 (08/21 0444) SpO2:  [90 %-97 %] 97 % (08/21 0444)  Physical Exam: General:  Adult male, large body habitus, resting in bed NAD, alert and at baseline. Cardiovascular: Regular rate and rhythm. Normal S1/S2. No murmurs, rubs, or gallops appreciated. 2+ radial pulses. Palpable 3+ thrill in LUE fistula.  No JVD. Pulmonary: Clear bilaterally to ascultation. No increased WOB, no accessory muscle usage on 3 L. No wheezes, rales, or crackles. Abdominal: Normoactive bowel sounds, nondistended. No tenderness to deep or light palpation. No rebound or guarding. Skin: Warm and dry.  Hypertrophic scars on chest.  No diaphoresis. Extremities: 1+ nonpitting peripheral edema in RLE, L AKA.  2+ nonpitting edema of LE.  L radial pulses 2+ bilaterally, fingers warm on both upper extremities.  Capillary refill <2 seconds in RLE.  Laboratory: Most recent CBC Lab Results  Component Value Date   WBC 6.0 07/17/2023   HGB 10.2 (L) 07/18/2023   HCT 30.0 (L) 07/18/2023   MCV 87.6 07/17/2023   PLT 111 (L) 07/17/2023   Most recent BMP    Latest Ref Rng & Units 07/19/2023    3:12 AM  BMP  Glucose 70 - 99 mg/dL 409   BUN 8 - 23 mg/dL 91   Creatinine 8.11 - 1.24 mg/dL 9.14   Sodium 782 - 956 mmol/L 131   Potassium 3.5 - 5.1 mmol/L 3.6   Chloride 98 - 111 mmol/L 89   CO2 22 - 32 mmol/L 26   Calcium 8.9 - 10.3 mg/dL 6.7     Other pertinent labs: - None  New Imaging/Diagnostic Tests: - CXR 8/20: Dual lumen left internal jugular catheter in place  Vincie Linn, MD 07/19/2023, 7:04 AM [facility]  FMTS Intern pager: [phone], text pages welcome Secure chat group [facility] Teaching Service

## 2023-07-19 NOTE — Assessment & Plan Note (Addendum)
Patient with low TIBC and iron, elevated ferritin >500 in the setting of chronic dialysis.  Suspect patient needs EPO stimulating agents.  Given ferritin >500 in ESRD, literature review shows that iron supplementation does not decrease EPO requirement or increase Hgb. - Follow up with Nephrology about EPO agents

## 2023-07-19 NOTE — Assessment & Plan Note (Signed)
Underwent placement of internal jugular tunneled cath 8/20, Vascular note states it was challenging and best return would be from fistula, if patient allows use.  Did have some difficulty with new catheter in HD yesterday and only 100 mL UF removed, will attempt again today.  T/Th/Sat HD schedule. - AM RFP and Mag daily

## 2023-07-19 NOTE — Progress Notes (Signed)
1900 - pt returned to room 5c-09 from dialysis during shift report.

## 2023-07-20 ENCOUNTER — Inpatient Hospital Stay (HOSPITAL_COMMUNITY): Payer: 59

## 2023-07-20 ENCOUNTER — Other Ambulatory Visit (HOSPITAL_COMMUNITY): Payer: 59

## 2023-07-20 DIAGNOSIS — M79602 Pain in left arm: Secondary | ICD-10-CM

## 2023-07-20 DIAGNOSIS — H53139 Sudden visual loss, unspecified eye: Secondary | ICD-10-CM | POA: Diagnosis not present

## 2023-07-20 DIAGNOSIS — H5340 Unspecified visual field defects: Secondary | ICD-10-CM | POA: Insufficient documentation

## 2023-07-20 LAB — BLOOD GAS, VENOUS
Acid-Base Excess: 3.5 mmol/L — ABNORMAL HIGH (ref 0.0–2.0)
Bicarbonate: 32.4 mmol/L — ABNORMAL HIGH (ref 20.0–28.0)
O2 Saturation: 71.7 %
Patient temperature: 36.9
pCO2, Ven: 69 mmHg — ABNORMAL HIGH (ref 44–60)
pH, Ven: 7.28 (ref 7.25–7.43)
pO2, Ven: 32 mmHg (ref 32–45)

## 2023-07-20 LAB — RENAL FUNCTION PANEL
Albumin: 3 g/dL — ABNORMAL LOW (ref 3.5–5.0)
Anion gap: 15 (ref 5–15)
BUN: 60 mg/dL — ABNORMAL HIGH (ref 8–23)
CO2: 25 mmol/L (ref 22–32)
Calcium: 7.3 mg/dL — ABNORMAL LOW (ref 8.9–10.3)
Chloride: 90 mmol/L — ABNORMAL LOW (ref 98–111)
Creatinine, Ser: 5.21 mg/dL — ABNORMAL HIGH (ref 0.61–1.24)
GFR, Estimated: 12 mL/min — ABNORMAL LOW (ref >60)
Glucose, Bld: 481 mg/dL — ABNORMAL HIGH (ref 70–99)
Phosphorus: 6.1 mg/dL — ABNORMAL HIGH (ref 2.5–4.6)
Potassium: 4.3 mmol/L (ref 3.5–5.1)
Sodium: 130 mmol/L — ABNORMAL LOW (ref 135–145)

## 2023-07-20 LAB — GLUCOSE, CAPILLARY
Glucose-Capillary: 493 mg/dL — ABNORMAL HIGH (ref 70–99)
Glucose-Capillary: 422 mg/dL — ABNORMAL HIGH (ref 70–99)
Glucose-Capillary: 373 mg/dL — ABNORMAL HIGH (ref 70–99)
Glucose-Capillary: 311 mg/dL — ABNORMAL HIGH (ref 70–99)
Glucose-Capillary: 172 mg/dL — ABNORMAL HIGH (ref 70–99)
Glucose-Capillary: 269 mg/dL — ABNORMAL HIGH (ref 70–99)
Glucose-Capillary: 244 mg/dL — ABNORMAL HIGH (ref 70–99)
Glucose-Capillary: 506 mg/dL (ref 70–99)
Glucose-Capillary: >600 mg/dL (ref 70–99)

## 2023-07-20 LAB — MAGNESIUM: Magnesium: 1.6 mg/dL — ABNORMAL LOW (ref 1.7–2.4)

## 2023-07-20 MED ORDER — INSULIN GLARGINE-YFGN 100 UNIT/ML ~~LOC~~ SOLN
20.0000 [IU] | Freq: Once | SUBCUTANEOUS | Status: AC
  Administered 2023-07-20: 20 [IU] via SUBCUTANEOUS
  Filled 2023-07-20: qty 0.2

## 2023-07-20 MED ORDER — HEPARIN SODIUM (PORCINE) 1000 UNIT/ML DIALYSIS
2000.0000 [IU] | Freq: Once | INTRAMUSCULAR | Status: AC
  Administered 2023-07-20: 2000 [IU] via INTRAVENOUS_CENTRAL
  Filled 2023-07-20: qty 2

## 2023-07-20 MED ORDER — INSULIN ASPART 100 UNIT/ML IJ SOLN
18.0000 [IU] | Freq: Once | INTRAMUSCULAR | Status: AC
  Administered 2023-07-20: 18 [IU] via SUBCUTANEOUS

## 2023-07-20 MED ORDER — LORAZEPAM 2 MG/ML PO CONC: 1.0000 mg | Freq: Once | ORAL | Status: DC | PRN

## 2023-07-20 MED ORDER — INSULIN ASPART 100 UNIT/ML IJ SOLN
0.0000 [IU] | Freq: Three times a day (TID) | INTRAMUSCULAR | Status: DC
  Administered 2023-07-20: 11 [IU] via SUBCUTANEOUS
  Administered 2023-07-20: 7 [IU] via SUBCUTANEOUS
  Administered 2023-07-21 (×3): 20 [IU] via SUBCUTANEOUS
  Administered 2023-07-22: 7 [IU] via SUBCUTANEOUS
  Administered 2023-07-22: 11 [IU] via SUBCUTANEOUS
  Administered 2023-07-22 – 2023-07-23 (×2): 4 [IU] via SUBCUTANEOUS
  Administered 2023-07-23: 7 [IU] via SUBCUTANEOUS

## 2023-07-20 MED ORDER — INSULIN ASPART 100 UNIT/ML IJ SOLN: 0.0000 [IU] | Freq: Three times a day (TID) | INTRAMUSCULAR | Status: DC

## 2023-07-20 MED ORDER — HEPARIN SODIUM (PORCINE) 1000 UNIT/ML IJ SOLN
INTRAMUSCULAR | Status: AC
  Administered 2023-07-20: 4200 [IU]
  Filled 2023-07-20: qty 5

## 2023-07-20 MED ORDER — INSULIN ASPART 100 UNIT/ML IJ SOLN
15.0000 [IU] | Freq: Once | INTRAMUSCULAR | Status: AC
  Administered 2023-07-20: 15 [IU] via SUBCUTANEOUS

## 2023-07-20 MED ORDER — HYDROMORPHONE HCL 1 MG/ML IJ SOLN
0.5000 mg | INTRAMUSCULAR | Status: DC | PRN
  Administered 2023-07-20 – 2023-07-21 (×4): 0.5 mg via INTRAVENOUS
  Filled 2023-07-20 (×5): qty 1

## 2023-07-20 MED ORDER — INSULIN ASPART 100 UNIT/ML IJ SOLN
22.0000 [IU] | Freq: Once | INTRAMUSCULAR | Status: AC
  Administered 2023-07-20: 22 [IU] via SUBCUTANEOUS

## 2023-07-20 MED ORDER — INSULIN GLARGINE-YFGN 100 UNIT/ML ~~LOC~~ SOLN
40.0000 [IU] | Freq: Every day | SUBCUTANEOUS | Status: DC
  Administered 2023-07-20: 40 [IU] via SUBCUTANEOUS
  Filled 2023-07-20 (×3): qty 0.4

## 2023-07-20 MED ORDER — LORAZEPAM 2 MG/ML PO CONC
1.0000 mg | Freq: Once | ORAL | Status: AC | PRN
  Administered 2023-07-20: 1 mg via ORAL
  Filled 2023-07-20: qty 0.5

## 2023-07-20 MED ORDER — INSULIN GLARGINE-YFGN 100 UNIT/ML ~~LOC~~ SOLN
20.0000 [IU] | Freq: Once | SUBCUTANEOUS | Status: AC
  Administered 2023-07-21: 20 [IU] via SUBCUTANEOUS
  Filled 2023-07-20: qty 0.2

## 2023-07-20 NOTE — Progress Notes (Signed)
Pt's initial checked blood glucose on this shift was 357. Pt had missed previous insulin dose due to receiving dialysis according to dayshift nurse, Fleet Contras. Pt began eating around 2130-2200. This nurse covered pt with 9U. Pt mentioned that he feels like his glucose levels aren't being controlled because he's not receiving his normal insulin (humulin 500 2x/day). Dr. Marisue Humble notified per above. NT checked and glucose was 493 and pt became somnolent requiring aggressive/loud stimulant for arousal. Dr. Truitt Merle and came to assess. Dr ordered insulin and blood gas, insulin given (check MAR/orders). Pt's blood glucose decreased to 422 and more insulin ordered and given. Pt is still somnolent during this time; not feeling most blood glucose checks and insulin injections. Dr. Truitt Merle, more insulin given and ordered. Dr. Gerlene Fee order for trnx for progressive care with bipap and telemetry monitoring. Pt was briefly placed on bipap but was removed due to pt not being able to remove bipap if needed due to somnolence. Dr. Truitt Merle of this. This nurse and NT later trnx to [phone]. Pt unaware he's being transferred, still somnolent during this transition.

## 2023-07-20 NOTE — Progress Notes (Signed)
Upper extremity venous duplex completed. Please see CV Procedures for preliminary results.  Shona Simpson, RVT 07/20/23 4:34 PM

## 2023-07-20 NOTE — Assessment & Plan Note (Addendum)
Pain still present, but better controlled now.  Got 60 ME yesterday.  Still unclear etiology. - s/p [facility] placement 8/20 with Vascular, appreciate assistance - Pain control: Scheduled Tylenol, decrease Dilaudid to 0.5 mg Q3h PRN - Neurovascular checks Q4h - Blood Cx 8/19 NGTD@2d 

## 2023-07-20 NOTE — Assessment & Plan Note (Addendum)
Does not use CPAP consistently, maybe 2 times a month, feels his home oxygen is enough.  On 3L at home for chronic respiratory failure.  Denies daytime drowsiness at baseline.  Suspect OHS component as well.  Also suspect that AMS episode yesterday more likely due to hypercarbia with chronically untreated OSA, but stroke now being considered. - CPAP nightly

## 2023-07-20 NOTE — Progress Notes (Addendum)
FMTS Interim Progress Note  S:Assessed patient at bedside with Dr. Rexene Alberts due to nursing report of hyperglycemia and somnolence.  He awakens to voice upon [place] entering the room and interacts but quickly falls back asleep. At first tells me his name is "Jeffrey [name]" (is watching the film on television) but is eventually able to tell me his name. Does follow commands to wiggle fingers.    O: BP (!) 153/58 (BP Location: Right Leg)   Pulse 86   Temp 98.5 F (36.9 C)   Resp 18   Ht 5\' 5"  (1.651 m)   Wt (S) 128.5 kg Comment: Bed Scale  SpO2 97%   BMI 47.14 kg/m   Gen: Very sleepy as above, awakens to answer questions and follow commands but falls back asleep quickly Cardio: RRR, transmitted fistula sounds Pulm: Normal WOB on 2L [place], lungs clear anteriorly MSK: L arm with mild edema, remains tender to touch, good radial pulse and he is able to wiggle fingers   A/P: Somnolence  Hyperglycemia CBG 493. Blood sugars have been difficult to control. Looked back through notes from past hospitalization and it seems prior to discharge he was receiving 80 units Semglee + Resistant SSI. Has only received 20 units of Semglee today. Potential causes of his somnolence are broad: hypercapnea, hypoxia, hyperglycemia, DKA, incomplete dialysis 2/2 his catheter issues (though less likely given successful UF this past session).  - Will go ahead and check a VBG and BMP for somnolence - 15 units Novolog now, repeat  CBG in 1 hour, would consider IV insulin and transition to progressive floor if not rapidly improving - Will re-dose 20 units Semglee, tomorrow's daytime dose will need to be increased, though he would like to be put back on his Humalin U500--will discuss with day team   ADDENDUM 0242 Patient has received 15 units of Novolog and 20 units of Semglee. CBG is now 422. Mental status remains unchanged per nursing report, still quite sleepy.  VBG is pH 7.28, pCO2 69. Suspect mental status change is  related to hypercarbia rather than hyperglycemia. It has been >8hrs since his last dose of opioids, so low suspicion this is contributing. - Will transfer to progressive unit for BiPAP x4 hours - Re-dose 15 units Novolog - Repeat CBG in 1 hour - Sliding scale increased to resistant sliding scale and daytime Semglee increased to 40 units, likely to need even more than this but will defer to day team here   ADDENDUM 0432 Reassessed patient. He is still on 5C soon to be moved to 6E. Had a brief trial of BiPAP but was removed due to unit constraints. His mental status does seem improved now compared to earlier even with only a brief trial of BiPAP. Awakens more easily, answers questions more swiftly and appropriately and is able to tell me he feels better.   Nursing working on transfer to 6E where he will be placed back on BiPAP.   Alicia Amel, MD 07/20/2023, 1:21 AM PGY-3, [facility] Family Medicine Service pager [phone]

## 2023-07-20 NOTE — Assessment & Plan Note (Signed)
Given onset with concurrent possible somnolence overnight as well as well as progressive visual field loss, there is significant concern for stroke.  Also considering infectious pathology such as preseptal cellulitis or orbital cellulitis.  Angle closure glaucoma is also considered, though no clear precipitating risk factors beyond T2DM. - MRI brain w/o contrast - Vasc US carotid - Vasc US LUE - Ophthalmology consult called, appreciate assistance - Visual acuity test w/ Snellen - Subsequent Fluorescein eye stain to evaluate for laceration, globe injury

## 2023-07-20 NOTE — Assessment & Plan Note (Deleted)
Given onset ~24 hours ago and preserved visual fields, less likely angle closure glaucoma.   - Fluorescein eye stain to evaluate for laceration, globe injury

## 2023-07-20 NOTE — Progress Notes (Signed)
Carotid arterial duplex completed. Please see CV Procedures for preliminary results.  Shona Simpson, RVT 07/20/23 4:33 PM

## 2023-07-20 NOTE — Care Management Important Message (Signed)
Important Message  Patient Details  Name: Jeffrey [name] MRN: [id number] Date of Birth: [birth year]   Medicare Important Message Given:  Yes     Renie Ora 07/20/2023, 8:10 AM

## 2023-07-20 NOTE — Inpatient Diabetes Management (Signed)
Inpatient Diabetes Program Recommendations  AACE/ADA: New Consensus Statement on Inpatient Glycemic Control   Target Ranges:  Prepandial:   less than 140 mg/dL      Peak postprandial:   less than 180 mg/dL (1-2 hours)      Critically ill patients:  140 - 180 mg/dL    Latest Reference Range & Units 07/20/23 01:07 07/20/23 02:26 07/20/23 04:02 07/20/23 05:34 07/20/23 09:59 07/20/23 12:28  Glucose-Capillary 70 - 99 mg/dL 161 (H) 096 (H) 045 (H) 311 (H) 172 (H) 269 (H)    Latest Reference Range & Units 07/18/23 08:11 07/18/23 11:55 07/18/23 13:29 07/18/23 15:49 07/18/23 17:22 07/18/23 18:13 07/19/23 07:22 07/19/23 12:44 07/19/23 21:02  Glucose-Capillary 70 - 99 mg/dL 409 (H) 811 (H) 914 (H) 185 (H) 182 (H) 192 (H) 212 (H) 314 (H) 357 (H)   Review of Glycemic Control  Diabetes history: DM2 Outpatient Diabetes medications: Humulin R U500 70-125 units BID with meals Current orders for Inpatient glycemic control: Semglee 40 units daily, Novolog 0-20 units TID with meals; Prednisone 30 mg QAM  Inpatient Diabetes Program Recommendations:    Insulin: Please consider ordering Semglee 20 units at bedtime (in addition to Semglee 40 units daily), add Novolog 0-5 units at bedtime, and Novolog 7 units TID with meals for meal coverage if patient eats at least 50% of meals.   Thanks, Orlando Penner, RN, MSN, CDCES Diabetes Coordinator Inpatient Diabetes Program [phone] (Team Pager from 8am to 5pm)

## 2023-07-20 NOTE — Assessment & Plan Note (Signed)
Uric acid 10.5, possible patient has some gout component to his LE pain. - Continue prednisone 30 mg daily until 8/22

## 2023-07-20 NOTE — Consult Note (Signed)
CC:  Chief Complaint  Patient presents with   severe pain left side of body    HPI: Jeffrey [name] is a 62 y.o. male w/ PMH below who is admitted for extremity pain and complications of ESRD.  Patient complains of onset of right eye pain with blurry vision. Symptoms started this morning.  Pt complains of foreign body sensation, 'like an eyelash is stuck in my eye'.  Bedside nurse reports that pt was seeing 'spiders' in vision in right eye this morning. Pt continues to complain of blurry vision.  ROS: Negative except as otherwise stated.  PMH: Past Medical History:  Diagnosis Date   Altered mental status 05/04/2021   Arthritis    Blood transfusion without reported diagnosis    CHF (congestive heart failure) (HCC)    COPD (chronic obstructive pulmonary disease) (HCC)    Depression    Diabetes mellitus without complication (HCC)    type 2   Dyspnea    ESRD on hemodialysis (HCC)    Tues Thurs Sat   GERD (gastroesophageal reflux disease)    protonix   Heart murmur    as a child, no problems   Hypertension    Myocardial infarction (HCC)    PTSD (post-traumatic stress disorder)    Sepsis (HCC)    Sleep apnea    occasional uses CPAP   Uses powered wheelchair     PSH: Past Surgical History:  Procedure Laterality Date   A/V FISTULAGRAM N/A 08/30/2021   Procedure: A/V FISTULAGRAM;  Surgeon: Maeola Harman, MD;  Location: [facility] INVASIVE CV LAB;  Service: Cardiovascular;  Laterality: N/A;   A/V FISTULAGRAM Right 07/28/2022   Procedure: A/V Fistulagram;  Surgeon: Maeola Harman, MD;  Location: [facility] INVASIVE CV LAB;  Service: Cardiovascular;  Laterality: Right;   A/V FISTULAGRAM Right 09/05/2022   Procedure: A/V Fistulagram;  Surgeon: Maeola Harman, MD;  Location: [facility] INVASIVE CV LAB;  Service: Cardiovascular;  Laterality: Right;   A/V FISTULAGRAM Right 10/31/2022   Procedure: A/V Fistulagram;  Surgeon: Maeola Harman, MD;  Location: [facility] INVASIVE CV  LAB;  Service: Cardiovascular;  Laterality: Right;   A/V SHUNT INTERVENTION Right 10/31/2022   Procedure: A/V SHUNT INTERVENTION;  Surgeon: Maeola Harman, MD;  Location: [facility] INVASIVE CV LAB;  Service: Cardiovascular;  Laterality: Right;   A/V SHUNTOGRAM N/A 11/08/2021   Procedure: A/V SHUNTOGRAM;  Surgeon: Maeola Harman, MD;  Location: [facility] INVASIVE CV LAB;  Service: Cardiovascular;  Laterality: N/A;   AMPUTATION Left 04/03/2021   Procedure: LEFT ABOVE-THE-KNEE AMPUTATION;  Surgeon: Terance Hart, MD;  Location: [facility] OR;  Service: Orthopedics;  Laterality: Left;   AV FISTULA PLACEMENT Right 08/12/2021   Procedure: INSERTION OF ARTERIOVENOUS (AV) GORE-TEX GRAFT RIGHT ARM;  Surgeon: Nada Libman, MD;  Location: [facility] OR;  Service: Vascular;  Laterality: Right;   AV FISTULA PLACEMENT Left 04/07/2023   Procedure: ARTERIOVENOUS (AV) FISTULA GRAFT USING GORETEX STRETCH (4-7MM);  Surgeon: Chuck Hint, MD;  Location: [facility] OR;  Service: Vascular;  Laterality: Left;   INSERTION OF DIALYSIS CATHETER N/A 11/25/2022   Procedure: INSERTION OF DIALYSIS CATHETER;  Surgeon: Lorin Glass, MD;  Location: [facility] ENDOSCOPY;  Service: Pulmonary;  Laterality: N/A;   INSERTION OF DIALYSIS CATHETER N/A 03/03/2023   Procedure: INSERTION OF TUNNELED DIALYSIS CATHETER;  Surgeon: Maeola Harman, MD;  Location: [facility] OR;  Service: Vascular;  Laterality: N/A;   INSERTION OF DIALYSIS CATHETER Left 04/07/2023   Procedure: INSERTION OF DIALYSIS CATHETER USING PALINDROME  28CM PRECISION CHRONIC CATHETER KIT;  Surgeon: Chuck Hint, MD;  Location: [facility] OR;  Service: Vascular;  Laterality: Left;   INSERTION OF DIALYSIS CATHETER Left 07/18/2023   Procedure: INSERTION OF TUNNELED DIALYSIS CATHETER USING 23cm PALINDROME CHRONIC CATHETER;  Surgeon: Chuck Hint, MD;  Location: [facility] OR;  Service: Vascular;  Laterality: Left;   IR REMOVAL TUN CV CATH W/O FL  03/30/2021   JOINT REPLACEMENT      Bilateral knees   LIGATION ARTERIOVENOUS GORTEX GRAFT Right 03/03/2023   Procedure: LIGATION RIGHT ARM ARTERIOVENOUS GORTEX GRAFT;  Surgeon: Maeola Harman, MD;  Location: [facility] OR;  Service: Vascular;  Laterality: Right;   PERIPHERAL VASCULAR BALLOON ANGIOPLASTY Right 08/30/2021   Procedure: PERIPHERAL VASCULAR BALLOON ANGIOPLASTY;  Surgeon: Maeola Harman, MD;  Location: [facility] INVASIVE CV LAB;  Service: Cardiovascular;  Laterality: Right;   PERIPHERAL VASCULAR BALLOON ANGIOPLASTY Right 07/28/2022   Procedure: PERIPHERAL VASCULAR BALLOON ANGIOPLASTY;  Surgeon: Maeola Harman, MD;  Location: [facility] INVASIVE CV LAB;  Service: Cardiovascular;  Laterality: Right;  AVF   PERIPHERAL VASCULAR INTERVENTION  11/08/2021   Procedure: PERIPHERAL VASCULAR INTERVENTION;  Surgeon: Maeola Harman, MD;  Location: [facility] INVASIVE CV LAB;  Service: Cardiovascular;;  Central rt arm fistula   UPPER EXTREMITY VENOGRAPHY Right 08/10/2021   Procedure: UPPER EXTREMITY VENOGRAPHY;  Surgeon: Nada Libman, MD;  Location: [facility] INVASIVE CV LAB;  Service: Cardiovascular;  Laterality: Right;    Meds: No current facility-administered medications on file prior to encounter.   Current Outpatient Medications on File Prior to Encounter  Medication Sig Dispense Refill   amLODipine (NORVASC) 10 MG tablet Take 10 mg by mouth every morning.     ASPIRIN LOW DOSE 81 MG EC tablet Take 81 mg by mouth in the morning.     atorvastatin (LIPITOR) 40 MG tablet Take 40 mg by mouth in the morning.     calcium acetate (PHOSLO) 667 MG capsule Take 2 capsules (1,334 mg total) by mouth with breakfast, with lunch, and with evening meal.     Calcium Carbonate Antacid (CALCIUM CARBONATE, DOSED IN MG ELEMENTAL CALCIUM,) 1250 MG/5ML SUSP Take 5 mLs (500 mg of elemental calcium total) by mouth every 6 (six) hours as needed for indigestion. 450 mL 2   carvedilol (COREG) 12.5 MG tablet Take 12.5 mg by mouth 2 (two) times  daily with a meal.     Cholecalciferol (VITAMIN D) 50 MCG (2000 UT) CAPS Take 2,000 Units by mouth daily.     clopidogrel (PLAVIX) 75 MG tablet Take 1 tablet (75 mg total) by mouth daily. 30 tablet 11   cyclobenzaprine (FLEXERIL) 10 MG tablet Take 10 mg by mouth 3 (three) times daily as needed for muscle spasms.     diazepam (VALIUM) 5 MG tablet Take 5 mg by mouth 2 (two) times daily as needed for muscle spasms.     insulin regular human CONCENTRATED (HUMULIN R U-500 KWIKPEN) 500 UNIT/ML KwikPen Inject 45 Units into the skin daily with supper. (Patient taking differently: Inject 70-125 Units into the skin 2 (two) times daily with a meal.) 2.7 mL 0   metolazone (ZAROXOLYN) 10 MG tablet Take 10 mg by mouth daily.     nortriptyline (PAMELOR) 10 MG capsule Take 20 mg by mouth at bedtime.     pantoprazole (PROTONIX) 40 MG tablet Take 40 mg by mouth daily before breakfast.     pregabalin (LYRICA) 75 MG capsule Take 1 capsule (75 mg total) by mouth See admin instructions.  Daily.  Give after dialysis on dialysis days. 30 capsule 0   sertraline (ZOLOFT) 100 MG tablet Take 100 mg by mouth in the morning.     tamsulosin (FLOMAX) 0.4 MG CAPS capsule Take 1 capsule (0.4 mg total) by mouth daily. 30 capsule 0   torsemide (DEMADEX) 20 MG tablet Take 120 mg by mouth 2 (two) times daily.     traZODone (DESYREL) 150 MG tablet Take 150 mg by mouth at bedtime.      SH: Social History   Socioeconomic History   Marital status: Significant Other    Spouse name: Not on file   Number of children: Not on file   Years of education: Not on file   Highest education level: Not on file  Occupational History   Occupation: disabled  Tobacco Use   Smoking status: Former    Current packs/day: 0.00    Types: Cigarettes    Quit date: 2012    Years since quitting: 12.6   Smokeless tobacco: Never  Vaping Use   Vaping status: Never Used  Substance and Sexual Activity   Alcohol use: Yes    Comment: occasionally   Drug  use: Not Currently    Comment: Years ago   Sexual activity: Not on file  Other Topics Concern   Not on file  Social History Narrative   Not on file   Social Determinants of Health   Financial Resource Strain: Medium Risk (09/05/2022)   Received from [facility], [facility]   Overall Financial Resource Strain (CARDIA)    Difficulty of Paying Living Expenses: Somewhat hard  Food Insecurity: No Food Insecurity (07/17/2023)   Hunger Vital Sign    Worried About Running Out of Food in the Last Year: Never true    Ran Out of Food in the Last Year: Never true  Transportation Needs: No Transportation Needs (07/17/2023)   PRAPARE - Administrator, Civil Service (Medical): No    Lack of Transportation (Non-Medical): No  Physical Activity: Inactive (09/08/2021)   Received from [facility], [facility]   Exercise Vital Sign    Days of Exercise per Week: 0 days    Minutes of Exercise per Session: 0 min  Stress: Stress Concern Present (09/07/2022)   Received from [facility], [facility]   [employer] of Occupational Health - Occupational Stress Questionnaire    Feeling of Stress : Rather much  Social Connections: Moderately Isolated (09/07/2022)   Received from [facility], [facility]   Social Connection and Isolation Panel [NHANES]    Frequency of Communication with Friends and Family: More than three times a week    Frequency of Social Gatherings with Friends and Family: Once a week    Attends Religious Services: 1 to 4 times per year    Active Member of [employer] or Organizations: No    Attends Banker Meetings: Never    Marital Status: Widowed    FH: History reviewed. No pertinent family history.   Past Ocular History: none   Last Eye Exam: few years ago   Primary Eye Care: none   Past Medical History:   Diagnosis Date   Altered mental status 05/04/2021   Arthritis    Blood transfusion without reported diagnosis    CHF (congestive heart failure) (HCC)    COPD (chronic obstructive pulmonary disease) (HCC)    Depression  Diabetes mellitus without complication (HCC)    type 2   Dyspnea    ESRD on hemodialysis (HCC)    Tues Thurs Sat   GERD (gastroesophageal reflux disease)    protonix   Heart murmur    as a child, no problems   Hypertension    Myocardial infarction [facility])    PTSD (post-traumatic stress disorder)    Sepsis (HCC)    Sleep apnea    occasional uses CPAP   Uses powered wheelchair      Past Surgical History:  Procedure Laterality Date   A/V FISTULAGRAM N/A 08/30/2021   Procedure: A/V FISTULAGRAM;  Surgeon: Maeola Harman, MD;  Location: [facility] INVASIVE CV LAB;  Service: Cardiovascular;  Laterality: N/A;   A/V FISTULAGRAM Right 07/28/2022   Procedure: A/V Fistulagram;  Surgeon: Maeola Harman, MD;  Location: [facility] INVASIVE CV LAB;  Service: Cardiovascular;  Laterality: Right;   A/V FISTULAGRAM Right 09/05/2022   Procedure: A/V Fistulagram;  Surgeon: Maeola Harman, MD;  Location: [facility] INVASIVE CV LAB;  Service: Cardiovascular;  Laterality: Right;   A/V FISTULAGRAM Right 10/31/2022   Procedure: A/V Fistulagram;  Surgeon: Maeola Harman, MD;  Location: [facility] INVASIVE CV LAB;  Service: Cardiovascular;  Laterality: Right;   A/V SHUNT INTERVENTION Right 10/31/2022   Procedure: A/V SHUNT INTERVENTION;  Surgeon: Maeola Harman, MD;  Location: [facility] INVASIVE CV LAB;  Service: Cardiovascular;  Laterality: Right;   A/V SHUNTOGRAM N/A 11/08/2021   Procedure: A/V SHUNTOGRAM;  Surgeon: Maeola Harman, MD;  Location: [facility] INVASIVE CV LAB;  Service: Cardiovascular;  Laterality: N/A;   AMPUTATION Left 04/03/2021   Procedure: LEFT ABOVE-THE-KNEE AMPUTATION;  Surgeon: Terance Hart, MD;  Location: [facility] OR;  Service: Orthopedics;   Laterality: Left;   AV FISTULA PLACEMENT Right 08/12/2021   Procedure: INSERTION OF ARTERIOVENOUS (AV) GORE-TEX GRAFT RIGHT ARM;  Surgeon: Nada Libman, MD;  Location: [facility] OR;  Service: Vascular;  Laterality: Right;   AV FISTULA PLACEMENT Left 04/07/2023   Procedure: ARTERIOVENOUS (AV) FISTULA GRAFT USING GORETEX STRETCH (4-7MM);  Surgeon: Chuck Hint, MD;  Location: [facility] OR;  Service: Vascular;  Laterality: Left;   INSERTION OF DIALYSIS CATHETER N/A 11/25/2022   Procedure: INSERTION OF DIALYSIS CATHETER;  Surgeon: Lorin Glass, MD;  Location: [facility] ENDOSCOPY;  Service: Pulmonary;  Laterality: N/A;   INSERTION OF DIALYSIS CATHETER N/A 03/03/2023   Procedure: INSERTION OF TUNNELED DIALYSIS CATHETER;  Surgeon: Maeola Harman, MD;  Location: [facility] OR;  Service: Vascular;  Laterality: N/A;   INSERTION OF DIALYSIS CATHETER Left 04/07/2023   Procedure: INSERTION OF DIALYSIS CATHETER USING PALINDROME  28CM PRECISION CHRONIC CATHETER KIT;  Surgeon: Chuck Hint, MD;  Location: [facility] OR;  Service: Vascular;  Laterality: Left;   INSERTION OF DIALYSIS CATHETER Left 07/18/2023   Procedure: INSERTION OF TUNNELED DIALYSIS CATHETER USING 23cm PALINDROME CHRONIC CATHETER;  Surgeon: Chuck Hint, MD;  Location: [facility] OR;  Service: Vascular;  Laterality: Left;   IR REMOVAL TUN CV CATH W/O FL  03/30/2021   JOINT REPLACEMENT     Bilateral knees   LIGATION ARTERIOVENOUS GORTEX GRAFT Right 03/03/2023   Procedure: LIGATION RIGHT ARM ARTERIOVENOUS GORTEX GRAFT;  Surgeon: Maeola Harman, MD;  Location: [facility] OR;  Service: Vascular;  Laterality: Right;   PERIPHERAL VASCULAR BALLOON ANGIOPLASTY Right 08/30/2021   Procedure: PERIPHERAL VASCULAR BALLOON ANGIOPLASTY;  Surgeon: Maeola Harman, MD;  Location: [facility] INVASIVE CV LAB;  Service: Cardiovascular;  Laterality: Right;   PERIPHERAL VASCULAR BALLOON  ANGIOPLASTY Right 07/28/2022   Procedure: PERIPHERAL VASCULAR BALLOON ANGIOPLASTY;   Surgeon: Maeola Harman, MD;  Location: [facility] INVASIVE CV LAB;  Service: Cardiovascular;  Laterality: Right;  AVF   PERIPHERAL VASCULAR INTERVENTION  11/08/2021   Procedure: PERIPHERAL VASCULAR INTERVENTION;  Surgeon: Maeola Harman, MD;  Location: [facility] INVASIVE CV LAB;  Service: Cardiovascular;;  Central rt arm fistula   UPPER EXTREMITY VENOGRAPHY Right 08/10/2021   Procedure: UPPER EXTREMITY VENOGRAPHY;  Surgeon: Nada Libman, MD;  Location: [facility] INVASIVE CV LAB;  Service: Cardiovascular;  Laterality: Right;     Social History   Socioeconomic History   Marital status: Significant Other    Spouse name: Not on file   Number of children: Not on file   Years of education: Not on file   Highest education level: Not on file  Occupational History   Occupation: disabled  Tobacco Use   Smoking status: Former    Current packs/day: 0.00    Types: Cigarettes    Quit date: 2012    Years since quitting: 12.6   Smokeless tobacco: Never  Vaping Use   Vaping status: Never Used  Substance and Sexual Activity   Alcohol use: Yes    Comment: occasionally   Drug use: Not Currently    Comment: Years ago   Sexual activity: Not on file  Other Topics Concern   Not on file  Social History Narrative   Not on file   Social Determinants of Health   Financial Resource Strain: Medium Risk (09/05/2022)   Received from [facility], [facility]   Overall Financial Resource Strain (CARDIA)    Difficulty of Paying Living Expenses: Somewhat hard  Food Insecurity: No Food Insecurity (07/17/2023)   Hunger Vital Sign    Worried About Running Out of Food in the Last Year: Never true    Ran Out of Food in the Last Year: Never true  Transportation Needs: No Transportation Needs (07/17/2023)   PRAPARE - Administrator, Civil Service (Medical): No    Lack of Transportation (Non-Medical): No  Physical Activity: Inactive (09/08/2021)   Received  from [facility], [facility]   Exercise Vital Sign    Days of Exercise per Week: 0 days    Minutes of Exercise per Session: 0 min  Stress: Stress Concern Present (09/07/2022)   Received from [facility], [facility]   [employer] of Occupational Health - Occupational Stress Questionnaire    Feeling of Stress : Rather much  Social Connections: Moderately Isolated (09/07/2022)   Received from [facility], [facility]   Social Connection and Isolation Panel [NHANES]    Frequency of Communication with Friends and Family: More than three times a week    Frequency of Social Gatherings with Friends and Family: Once a week    Attends Religious Services: 1 to 4 times per year    Active Member of [employer] or Organizations: No    Attends Banker Meetings: Never    Marital Status: Widowed  Intimate Partner Violence: Not At Risk (07/17/2023)   Humiliation, Afraid, Rape, and Kick questionnaire    Fear of Current or Ex-Partner: No    Emotionally Abused: No    Physically Abused: No    Sexually Abused: No     Allergies  Allergen Reactions   Actos [Pioglitazone] Anaphylaxis and Rash   Dexmedetomidine Nausea And Vomiting and Other (See  Comments)    (Precedex) Dose-limiting bradycardia     Ibuprofen Shortness Of Breath, Swelling and Other (See Comments)    Pt tolerates aspirin    Tomato Anaphylaxis and Other (See Comments)    Only allergic to RAW tomatoes   Wellbutrin [Bupropion] Swelling     No current facility-administered medications on file prior to encounter.   Current Outpatient Medications on File Prior to Encounter  Medication Sig Dispense Refill   amLODipine (NORVASC) 10 MG tablet Take 10 mg by mouth every morning.     ASPIRIN LOW DOSE 81 MG EC tablet Take 81 mg by mouth in the morning.     atorvastatin (LIPITOR) 40 MG tablet Take 40 mg by mouth in the  morning.     calcium acetate (PHOSLO) 667 MG capsule Take 2 capsules (1,334 mg total) by mouth with breakfast, with lunch, and with evening meal.     Calcium Carbonate Antacid (CALCIUM CARBONATE, DOSED IN MG ELEMENTAL CALCIUM,) 1250 MG/5ML SUSP Take 5 mLs (500 mg of elemental calcium total) by mouth every 6 (six) hours as needed for indigestion. 450 mL 2   carvedilol (COREG) 12.5 MG tablet Take 12.5 mg by mouth 2 (two) times daily with a meal.     Cholecalciferol (VITAMIN D) 50 MCG (2000 UT) CAPS Take 2,000 Units by mouth daily.     clopidogrel (PLAVIX) 75 MG tablet Take 1 tablet (75 mg total) by mouth daily. 30 tablet 11   cyclobenzaprine (FLEXERIL) 10 MG tablet Take 10 mg by mouth 3 (three) times daily as needed for muscle spasms.     diazepam (VALIUM) 5 MG tablet Take 5 mg by mouth 2 (two) times daily as needed for muscle spasms.     insulin regular human CONCENTRATED (HUMULIN R U-500 KWIKPEN) 500 UNIT/ML KwikPen Inject 45 Units into the skin daily with supper. (Patient taking differently: Inject 70-125 Units into the skin 2 (two) times daily with a meal.) 2.7 mL 0   metolazone (ZAROXOLYN) 10 MG tablet Take 10 mg by mouth daily.     nortriptyline (PAMELOR) 10 MG capsule Take 20 mg by mouth at bedtime.     pantoprazole (PROTONIX) 40 MG tablet Take 40 mg by mouth daily before breakfast.     pregabalin (LYRICA) 75 MG capsule Take 1 capsule (75 mg total) by mouth See admin instructions. Daily.  Give after dialysis on dialysis days. 30 capsule 0   sertraline (ZOLOFT) 100 MG tablet Take 100 mg by mouth in the morning.     tamsulosin (FLOMAX) 0.4 MG CAPS capsule Take 1 capsule (0.4 mg total) by mouth daily. 30 capsule 0   torsemide (DEMADEX) 20 MG tablet Take 120 mg by mouth 2 (two) times daily.     traZODone (DESYREL) 150 MG tablet Take 150 mg by mouth at bedtime.       ROS    Exam:  General: Awake, Alert, Oriented *3  Vision (near): without correction   OD: CF  OS: CF  Confrontational  Field:   Full to count fingers, both eyes  Extraocular Motility:  Full ductions and versions, both eyes  External:   Normal Symmetry, V1-3 intact, infraorbital nerve appears intact.   Pupils  OD: 4mm to 3mm reactive without afferent pupillary defect (APD)  OS: 4mm to 3mm reactive without afferent pupillary defect (APD)   IOP(tonopen)  OD: 20  OS: 21  Slit Lamp Exam:  Lids/Lashes  OD: Normal Lids and lashes, No lesion or injury  OS: Normal lids  and lashes, nor lesion or injury  Conjunctiva/Sclera  OD: White and quiet  OS: White and quiet  Cornea  OD: Clear without abrasion or defect  OS: Clear without abrasion or defect  Anterior Chamber  OD: Deep and quiet  OS: Deep and quiet  Iris  OD: Normal iris architecture  OS: Normal Iris Architecture   Lens  OD: NSC  OS: NSC  Anterior Vitreous  OD: Clear, without cell  OS: Clear without cell   POSTERIOR POLE EXAM (Dilated with phenylephrine and tropicamide.  Dilation may last up to 24 hours)  View:   OD: 20/20 view without opacities  OS: 20/20 view without opacities  Vitreous:   OD: Clear, no cell  OS: Clear, no cell  Disc:   OD: flat, sharp margin, with appropriate color  OS: flat, sharp margin, with appropriate color  C:D Ratio:   OD: 0.3  OS: 0.3  Macula  OD: Flat, with appropriate light reflex  OS: Flat with appropriate light reflex  Vessels  OD: Normal vasculature  OS: Normal vasculature  Periphery  OD: Flat 360 degrees without tear, hole or detachment  OS: Flat 360 degrees without tear, hole or detachment     Assessment and Plan:   Blurry vision -Pt notes onset eye pain & blurry vision OD this AM -Benign ophthalmic exam OU. -Given significant hyperglycemia, suspect this is cause of blurry vision - no urgent intervention indicated -Can use artificial tears PRN eye pain -recommend patient has routine diabetic eye exam after discharge  Please call with questions.  Ophthalmology will  sign off.  Coralee North, MD Ophthalmology [facility] [phone]  Total Time Spent: 30 minutes

## 2023-07-20 NOTE — Progress Notes (Addendum)
RT at bedside to assess pt. RT found pt on 3L [place] with BiPAP (resmed) on stby at bedside.

## 2023-07-20 NOTE — Assessment & Plan Note (Signed)
T2DM on 70-125 units regular insulin BID with meals at home.  CBGs [phone] overnight, likely needs more long acting given that last admission was on 80U.  Recived 40 U LA and 49 U SA in last 24 hours. - Now on 40U daily LA - Increased to resistant SSI - Will reevaluate this afternoon, increase LA if still hyperglycemic and based on SA needs - CBG monitoring Q4h

## 2023-07-20 NOTE — Progress Notes (Signed)
   07/20/23 2303  BiPAP/CPAP/SIPAP  $ Non-Invasive Ventilator  Non-Invasive Vent Subsequent  BiPAP/CPAP/SIPAP Pt Type Adult  BiPAP/CPAP/SIPAP Resmed  Mask Type Full face mask  Mask Size Medium  PEEP 10 cmH20  FiO2 (%) 32 %  Flow Rate 3 lpm  Patient Home Equipment No  Auto Titrate No  Nasal massage performed Yes  CPAP/SIPAP surface wiped down Yes

## 2023-07-20 NOTE — Assessment & Plan Note (Signed)
Patient with low TIBC and iron, elevated ferritin >500 in the setting of chronic dialysis.  On ESA and iron per Nepro recs. - On darbepoetin alfa per Nephro - Will receive iron infusions 4 days with HD per Pharmacy dosing

## 2023-07-20 NOTE — Progress Notes (Addendum)
Daily Progress Note Intern Pager: [phone]  Patient name: Jeffrey [name] Medical record number: [id number] Date of birth: 1961-05-04 Age: 62 y.o. Gender: male  Primary Care Provider: Knox Royalty, MD Consultants: Vascular Surgery, Nephrology (HD) Code Status: FULL  Pt Overview and Major Events to Date:  8/19 - Admitted, having severe pain out of proportion in LUE 8/20 - Difficult tunneled internal jugular cath placement, HD with poorly sustained flow 8/21 - Started prednisone, successful HD w/ 3L dialyzed 8/22 - Drowsy overnight, hyperglycemic; increased LA and SA insulin, decreased Dilaudid. New R eye pain and visual deficit, Ophtho called and stroke workup started   Assessment and Plan: Jeffrey [name] is a 62 y.o. male with a pertinent PMH of ESRD on HD T/Th/Sat, OSA, and PTSD who presented with LUE pain s/p HD and was admitted for [facility] placement, now s/p tunneled internal jugular catheter placement 8/20 with better flow 8/21 and successful HD.  Differential for new somnolence overnight includes stroke, OSA/OHS, DKA, hyperglycemia, and opioid overdose, but appears improved.  Suspect more due to OSA/OHS than hyperglycemia given hypercarbia on VBG and improvement with BiPAP, patient reportedly sporadically uses CPAP at home.  Anion gap elevated, but elevation is chronic and patient not significantly acidotic on VBG; do not suspect DKA at this time.  Acute hyperglycemia likely exacerbated by new prednisone, but improving now.  With unchanged 60 ME administered yesterday and no Dilaudid prior to drowsiness, do not suspect opiate overdose or polypharmacy.  However, with progressively worsening visual field loss and R-sided ophthalmic complaints, concern for stroke is increased.  Considering LUE pain and recent vascular surgery, patient is at increased risk of embolism and stroke workup is warranted. Assessment & Plan Left arm pain Pain still present, but better controlled now.  Got 60 ME  yesterday.  Still unclear etiology. - s/p [facility] placement 8/20 with Vascular, appreciate assistance - Pain control: Scheduled Tylenol, decrease Dilaudid to 0.5 mg Q3h PRN - Neurovascular checks Q4h - Blood Cx 8/19 NGTD@2d  Visual field defect of right eye Given onset with concurrent possible somnolence overnight as well as well as progressive visual field loss, there is significant concern for stroke.  Also considering infectious pathology such as preseptal cellulitis or orbital cellulitis.  Angle closure glaucoma is also considered, though no clear precipitating risk factors beyond T2DM. - MRI brain w/o contrast - Vasc US carotid - Vasc US LUE - Ophthalmology consult called, appreciate assistance - Visual acuity test w/ Snellen - Subsequent Fluorescein eye stain to evaluate for laceration, globe injury OSA (obstructive sleep apnea) Does not use CPAP consistently, maybe 2 times a month, feels his home oxygen is enough.  On 3L at home for chronic respiratory failure.  Denies daytime drowsiness at baseline.  Suspect OHS component as well.  Also suspect that AMS episode yesterday more likely due to hypercarbia with chronically untreated OSA, but stroke now being considered. - CPAP nightly Type 2 diabetes mellitus with hyperlipidemia (HCC) T2DM on 70-125 units regular insulin BID with meals at home.  CBGs [phone] overnight, likely needs more long acting given that last admission was on 80U.  Recived 40 U LA and 49 U SA in last 24 hours. - Now on 40U daily LA - Increased to resistant SSI - Will reevaluate this afternoon, increase LA if still hyperglycemic and based on SA needs - CBG monitoring Q4h ESRD on dialysis [facility]) S/p internal jugular tunneled cath 8/20, successful 3 L dialyzed 8/21.  T/Th/Sat HD schedule. - AM  RFP and Mag daily Anemia due to chronic kidney disease, on chronic dialysis [facility]) Patient with low TIBC and iron, elevated ferritin >500 in the setting of chronic dialysis.  On ESA  and iron per Nepro recs. - On darbepoetin alfa per Nephro - Will receive iron infusions 4 days with HD per Pharmacy dosing Hyperuricemia Uric acid 10.5, possible patient has some gout component to his LE pain. - Continue prednisone 30 mg daily until 8/22  Chronic and Stable Problems: PVD - ASA, Plavix, Lipitor CHF - Continue Coreg, torsemide, metolazone at home doses HTN - Continue home amlodipine and coreg PTSD - Continue home Trazodone, Valium > Sleeps very little nightly Chronic Pain - Lyrica after HD, Nortriptyline at bedtime, Valium PRN Chronic Hypoxic Respiratory Failure - On home 3L  FEN/GI: Regular diet PPx: Heparin d/t ESRD Dispo: Pending PT recommendations  pending clinical improvement . Barriers include TDC placement, HD success.  Subjective:  Overnight, patient became sleepy and hyperglycemic to 490s.  After another 20U semglee and 15+15 short acting, CBG improved to 373.  VBG was drawn and with CO2 to 69, patient was transferred to progressive floor for 4 hours of BiPAP.  Mental status much improved after short BiPAP course, but could not tolerate prolonged therapy due to concern for possible somnolence on unit and therapy discontinued.  This morning, patient reports that his right eye is "not seeing" and feels like "something is stabbing it."  Reports that this began yesterday late afternoon.  States that vision was blurry but he can still see somewhat.  Feels eye tearing up and it is somewhat itchy.  Does not recall injuring eye but did have some nurses help flush it out without improvement.  States that he initially thought there was an eyelash stuck in his eye because the feeling was similar.  Denies dizziness, nausea, headache, vestibular symptoms.  Is not disoriented or confused.  States that left upper extremity pain is somewhat better than yesterday, still continuing.  Objective: Temp:  [97.8 F (36.6 C)-98.9 F (37.2 C)] 98.1 F (36.7 C) (08/22 0526) Pulse Rate:   [76-98] 87 (08/22 0529) Resp:  [10-18] 16 (08/22 0529) BP: (120-153)/(58-110) 148/110 (08/22 0526) SpO2:  [58 %-100 %] 98 % (08/22 0529) Weight:  [128.5 kg-132.5 kg] 128.5 kg (08/21 1839)  Physical Exam: General: Adult male, large body habitus, resting comfortably in bed getting dialysis, NAD, alert and at baseline, not somnolent, responding appropriately. HEENT: Constricted pupils bilaterally (1-2 mm), tearing of R eye with exam, difficult to keep open, decreased visual field on R side, EOM normal, no nystagmus, possible mild R eyelid edema and erythema. No tenderness to percussion over sinuses. Cardiovascular: Regular rate and rhythm. Normal S1/S2. No murmurs, rubs, or gallops appreciated. 2+ radial pulses. Skin: Warm and dry.  Hypertrophic scars on chest. Extremities: LUE with 1+ edema, wedding band imprint still visible.  2+ radial pulses bilaterally. 3+ thrill in LUE fistula.  L AKA, R 1+ pitting edema.  Neurological Examination: MS: Awake, alert, interactive. Difficulty making eye contact, prefers to close eyes, answered the questions appropriately, speech was fluent, normal comprehension.  Attention and concentration were normal. Cranial Nerves: Pupils constricted (1-2 mm); visual field decreased on R side; EOM normal, no nystagmus; difficult to keep R eye open, no diplopia but blurred vision, intact facial sensation, face symmetric with full strength of facial muscles, hearing intact to finger rub bilaterally, palate elevation is symmetric, tongue protrusion is symmetric with full movement to both sides.  Sternocleidomastoid and trapezius  are with normal strength. Tone: Normal Strength: 5/5 in RUE and RLE, difficult to evaluate in LE given pain and L AKA. Sensation: Intact to light touch, temperature, vibration  Laboratory: Most recent CBC Lab Results  Component Value Date   WBC 6.0 07/17/2023   HGB 10.2 (L) 07/18/2023   HCT 30.0 (L) 07/18/2023   MCV 87.6 07/17/2023   PLT 111 (L)  07/17/2023   Most recent BMP    Latest Ref Rng & Units 07/20/2023    1:54 AM  BMP  Glucose 70 - 99 mg/dL 161   BUN 8 - 23 mg/dL 60   Creatinine 0.96 - 1.24 mg/dL 0.45   Sodium 409 - 811 mmol/L 130   Potassium 3.5 - 5.1 mmol/L 4.3   Chloride 98 - 111 mmol/L 90   CO2 22 - 32 mmol/L 25   Calcium 8.9 - 10.3 mg/dL 7.3     Other pertinent labs: - VBG: pCO2 69, bicarb 32.4, O2 sat 71.7% - P 6.1 - Anion gap 15  New Imaging/Diagnostic Tests: - MRI brain w/o contrast: Pending - US carotid: Pending - [place] LUE: Pending  Iyona Pehrson, MD 07/20/2023, 7:04 AM [facility]  FMTS Intern pager: [phone], text pages welcome Secure chat group [facility] Teaching Service

## 2023-07-20 NOTE — Procedures (Signed)
HD Note:  Some information was entered later than the data was gathered due to patient care needs. The stated time with the data is accurate.  Received patient in bed to unit.   Alert and oriented.   Informed consent signed and in chart.   Access used: Upper right chest HD catheter Access issues: some sluggishness.  See flowsheet  Patient was medicated for pain x2 see MAR. Patient's ordered ferrilecit , IV, was not delivered by pharmacy while patient was on the unit.  The pharmacy stated that it was sent, but not received.  TX duration: 3  Transported back to the room   Alert, without acute distress.  Total UF removed: 3000 ml  Hand-off given to patient's nurse.   Hayleen Clinkscales L. Dareen Piano, RN [facility].

## 2023-07-20 NOTE — Assessment & Plan Note (Signed)
S/p internal jugular tunneled cath 8/20, successful 3 L dialyzed 8/21.  T/Th/Sat HD schedule. - AM RFP and Mag daily

## 2023-07-20 NOTE — Progress Notes (Signed)
[facility] Progress Note   Subjective:  Seen on HD - 3L UFG and tolerating, says L arm pain is a little better. Now with vision loss in R eye, says started yesterday, painless - being eval by primary team (observed doc doing eye exam).  Objective Vitals:   07/20/23 0930 07/20/23 1000 07/20/23 1030 07/20/23 1100  BP: (!) 150/79 (!) 143/74 (!) 146/51 131/84  Pulse: 90 80 83 86  Resp: (!) 28 (!) 8 (!) 7 19  Temp:      TempSrc:      SpO2: 100% 100% 100% 100%  Weight:      Height:       Physical Exam General: Well appearing man, NAD. Nasal O2 in place. Heart: RRR; no murmur Lungs: CTA anteriorly Abdomen: distended, soft Extremities: No RLE edema, L AKA Dialysis Access: TDC in L chest, LUE AVF + bruit - arm still edematous  Additional Objective Labs: Basic Metabolic Panel: Recent Labs  Lab 07/17/23 2359 07/18/23 1333 07/19/23 0312 07/20/23 0154  NA 133* 134* 131* 130*  K 3.5 3.6 3.6 4.3  CL 89* 88* 89* 90*  CO2 28  --  26 25  GLUCOSE 266* 248* 208* 481*  BUN 83* 98* 91* 60*  CREATININE 5.31* 5.70* 6.33* 5.21*  CALCIUM 6.9*  --  6.7* 7.3*  PHOS 6.4*  --  6.8* 6.1*   Liver Function Tests: Recent Labs  Lab 07/17/23 0830 07/17/23 2359 07/19/23 0312 07/20/23 0154  AST 15  --   --   --   ALT 14  --   --   --   ALKPHOS 84  --   --   --   BILITOT 0.4  --   --   --   PROT 7.1  --   --   --   ALBUMIN 3.5 3.4* 3.0* 3.0*   CBC: Recent Labs  Lab 07/17/23 0830 07/18/23 1333  WBC 6.0  --   NEUTROABS 4.7  --   HGB 9.5* 10.2*  HCT 31.7* 30.0*  MCV 87.6  --   PLT 111*  --    Iron Studies:  Recent Labs    07/19/23 0312  IRON 20*  TIBC 217*  FERRITIN 586*   Studies/Results: DG CHEST PORT 1 VIEW  Result Date: 07/18/2023 CLINICAL DATA:  Vascular dialysis catheter in place. EXAM: PORTABLE CHEST 1 VIEW COMPARISON:  Chest radiographs 05/31/2023, 04/22/2023; CT chest 10/20/2022 FINDINGS: Interval placement of dual-lumen left internal jugular approach  central venous catheter with tip overlying the superior right atrium. There is again a right brachiocephalic vein stent graft. Cardiac silhouette is again moderately enlarged. Mediastinal contours are within normal limits. Moderately decreased lung volumes. Moderate bilateral interstitial thickening appears slightly increased from 05/31/2023 and 04/22/2023, although a portion of this may be due to low lung volumes. No definite pleural effusion.  No pneumothorax. Partial visualization of reverse total right shoulder arthroplasty. IMPRESSION: 1. Interval placement of dual-lumen left internal jugular approach central venous catheter with tip overlying the superior right atrium. 2. Moderate bilateral interstitial thickening appears slightly increased from 05/31/2023 and 04/22/2023, although a portion of this may be due to low lung volumes. This may represent mild interstitial pulmonary edema. Electronically Signed   By: Neita Garnet M.D.   On: 07/18/2023 19:17   PERIPHERAL VASCULAR CATHETERIZATION  Result Date: 07/18/2023 See surgical note for result.  HYBRID OR IMAGING ([facility] ONLY)  Result Date: 07/18/2023 There is no interpretation for this exam.  This order is  for images obtained during a surgical procedure.  Please See "Surgeries" Tab for more information regarding the procedure.   Medications:  ferric gluconate (FERRLECIT) IVPB      acetaminophen  650 mg Oral Q6H   amLODipine  10 mg Oral q morning   aspirin EC  81 mg Oral Daily   atorvastatin  40 mg Oral Daily   calcitRIOL  0.25 mcg Oral Daily   calcium acetate  1,334 mg Oral TID with meals   calcium carbonate  1 tablet Oral TID   carvedilol  12.5 mg Oral BID WC   Chlorhexidine Gluconate Cloth  6 each Topical Q0600   clopidogrel  75 mg Oral Daily   darbepoetin (ARANESP) injection - DIALYSIS  40 mcg Subcutaneous Q Wed-1800   heparin  5,000 Units Subcutaneous Q8H   insulin aspart  0-20 Units Subcutaneous TID WC   insulin glargine-yfgn  40 Units  Subcutaneous Daily   metolazone  10 mg Oral Daily   nortriptyline  20 mg Oral QHS   pantoprazole  40 mg Oral QAC breakfast   predniSONE  30 mg Oral Q breakfast   pregabalin  75 mg Oral Daily   sertraline  100 mg Oral Daily   tamsulosin  0.4 mg Oral Daily   torsemide  120 mg Oral BID   traZODone  150 mg Oral QHS    Dialysis Orders: No outpatient unit, comes to [facility] on TTS schedule typically - 3:25hr, 3K, heparin 2000 unit bolus, EDW ~129kg  Assessment/Plan: 1. L arm pain s/p L AVF infiltration/hematoma: Resting AVF for now; [facility] in place. 2. ESRD: Continue HD on TTS schedule while here - does not have outpatient unit. HD now. 3. HTN/volume: BP fine, UF as tolerated 4. Anemia: Hgb 10.2, tsat 9%. Getting course of IV iron and Aranesp q Wed. 5. Secondary hyperparathyroidism: CorrCa ok, Phos high - continue home Phoslo as binder, starting calcitriol 0.67mcg daily and TUMS at bedtime.  6. T2DM: Insulin per primary. 7. R eye vision loss - ?new: Work-up per primary.   Ozzie Hoyle, PA-C 07/20/2023, 11:12 AM  [employer]

## 2023-07-21 DIAGNOSIS — M79602 Pain in left arm: Secondary | ICD-10-CM | POA: Diagnosis not present

## 2023-07-21 LAB — SURGICAL PCR SCREEN
MRSA, PCR: POSITIVE — AB
Staphylococcus aureus: POSITIVE — AB

## 2023-07-21 LAB — GLUCOSE, CAPILLARY
Glucose-Capillary: 370 mg/dL — ABNORMAL HIGH (ref 70–99)
Glucose-Capillary: 398 mg/dL — ABNORMAL HIGH (ref 70–99)
Glucose-Capillary: 406 mg/dL — ABNORMAL HIGH (ref 70–99)
Glucose-Capillary: 408 mg/dL — ABNORMAL HIGH (ref 70–99)
Glucose-Capillary: 427 mg/dL — ABNORMAL HIGH (ref 70–99)
Glucose-Capillary: 512 mg/dL (ref 70–99)

## 2023-07-21 MED ORDER — INSULIN REGULAR HUMAN (CONC) 500 UNIT/ML ~~LOC~~ SOPN
40.0000 [IU] | PEN_INJECTOR | Freq: Two times a day (BID) | SUBCUTANEOUS | Status: DC
  Administered 2023-07-21 – 2023-07-22 (×2): 40 [IU] via SUBCUTANEOUS
  Filled 2023-07-21: qty 3

## 2023-07-21 MED ORDER — POLYVINYL ALCOHOL 1.4 % OP SOLN: 1.0000 [drp] | OPHTHALMIC | Status: DC | PRN

## 2023-07-21 MED ORDER — INSULIN GLARGINE-YFGN 100 UNIT/ML ~~LOC~~ SOLN
40.0000 [IU] | Freq: Two times a day (BID) | SUBCUTANEOUS | Status: DC
  Administered 2023-07-21: 40 [IU] via SUBCUTANEOUS
  Filled 2023-07-21 (×2): qty 0.4

## 2023-07-21 MED ORDER — OXYCODONE HCL 5 MG PO TABS
5.0000 mg | ORAL_TABLET | ORAL | Status: DC | PRN
  Administered 2023-07-21 – 2023-07-23 (×8): 5 mg via ORAL
  Filled 2023-07-21 (×8): qty 1

## 2023-07-21 MED ORDER — OXYCODONE HCL 5 MG PO TABS
5.0000 mg | ORAL_TABLET | Freq: Once | ORAL | Status: AC
  Administered 2023-07-21: 5 mg via ORAL
  Filled 2023-07-21: qty 1

## 2023-07-21 MED ORDER — INSULIN ASPART 100 UNIT/ML IJ SOLN
10.0000 [IU] | Freq: Three times a day (TID) | INTRAMUSCULAR | Status: DC
  Administered 2023-07-21 – 2023-07-23 (×7): 10 [IU] via SUBCUTANEOUS

## 2023-07-21 NOTE — Progress Notes (Signed)
Daily Progress Note Intern Pager: [phone]  Patient name: Jeffrey [name] Medical record number: [id number] Date of birth: 07/17/1961 Age: 62 y.o. Gender: male  Primary Care Provider: Knox Royalty, MD Consultants: Vascular Surgery, Nephrology (HD)  Code Status: FULL  Pt Overview and Major Events to Date:  8/19 - Admitted, having severe pain out of proportion in LUE 8/20 - Difficult tunneled internal jugular cath placement, HD with poorly sustained flow 8/21 - Started prednisone, successful HD w/ 3L dialyzed 8/22 - Drowsy overnight, hyperglycemic; increased LA and SA insulin, decreased Dilaudid. New R eye pain and visual deficit, Ophtho called and evaluated, stroke workup negative 8/23 - Switch to PO opiates, increasing insulin further  Assessment and Plan: Jeffrey [name] is a 62 y.o. male with a pertinent PMH of ESRD on HD T/Th/Sat, OSA, and PTSD who presented with LUE pain s/p HD and was admitted for [facility] placement, now s/p tunneled internal jugular catheter placement 8/20 and with new R eye symptoms and hyperglycemia. Assessment & Plan Left arm pain Pain still present, but improving daily.  Got 70 ME yesterday.  Still unclear etiology, but suspect infiltration during HD, gout still possibly contributing. - s/p [facility] placement 8/20 with Vascular, appreciate assistance - Pain control: Scheduled Tylenol, switch from Dilaudid to 0.5 mg IV Q3h to oxycodone 5 mg PO Q4h - Neurovascular checks Q4h - Blood Cx 8/19 NGTD@3d  Visual field defect of right eye Workup negative for stroke with MRI, vascular US carotid and LUE.  Ophtho saw patient and signed off, suspect vision changes related to hyperglycemia.  No concern for globe injury, no corneal laceration. - Artificial tears PRN - Visual acuity test w/ Snellen if vision changing - Will follow up with Ophtho outpatient OSA (obstructive sleep apnea) Does not use CPAP consistently.  On 3L at home for chronic respiratory failure.  Suspect OHS  component as well.  Did use CPAP last night. - CPAP nightly Type 2 diabetes mellitus with hyperlipidemia (HCC) T2DM on 70-125 units regular insulin BID with meals at home.  A1c 9.3 in July.  CBGs [phone] overnight, suspect consistent hyperglycemia due to prednisone.  Recived 60 U LA and 58 U SA in last 24 hours. - Increase to 40U LA BID, switch to home Humalin R 500 - Resistant SSI - Start 10U AC coverage - CBG monitoring Q4h - Start carb modified diet ESRD on dialysis [facility]) S/p internal jugular tunneled cath 8/20, successful 3 L dialyzed 8/21.  T/Th/Sat HD schedule. - AM RFP and Mag per Nephro Anemia due to chronic kidney disease, on chronic dialysis [facility]) Patient with low TIBC and iron, elevated ferritin >500 in the setting of chronic dialysis.  On ESA and iron per Nepro recs. - On darbepoetin alfa per Nephro - Will receive iron infusions 4 days with HD per Pharmacy dosing Hyperuricemia Uric acid 10.5, possible patient has some gout component to his LE pain. - Finished prednisone course today (8/23) after 3 doses  Chronic and Stable Problems: PVD - ASA, Plavix, Lipitor CHF - Continue Coreg, torsemide, metolazone at home doses HTN - Continue home amlodipine and carvedilol PTSD - Continue home Trazodone, Valium > Sleeps very little nightly Chronic Pain - Lyrica after HD, Nortriptyline at bedtime, Valium PRN Chronic Hypoxic Respiratory Failure - On home 3L  FEN/GI: Regular diet PPx: Heparin d/t ESRD Dispo:  Home on HD. Barriers include control of hyperglycemia, consistent HD.  Subjective:  This morning, patient reports that his left upper extremity is feeling better  though continues to hurt somewhat.  States that he did eat some cake last night he knows he should not, but he will continue to eat sweets at home and request vehemently that we do not put him on a carb modified diet.  He also asks about his U-500 insulin.  With respect to his right eye, he states that it is feeling  better, tearing up less and he is able to open it better.  Does still report some blurred vision on R side.  Denies having spots in vision, dark spots.  Denies headache, vertigo, dizziness, weakness.  Objective: Temp:  [97.5 F (36.4 C)-98.2 F (36.8 C)] 98 F (36.7 C) (08/23 0357) Pulse Rate:  [77-91] 78 (08/23 0357) Resp:  [7-28] 12 (08/23 0357) BP: (131-182)/(51-143) 150/69 (08/23 0357) SpO2:  [92 %-100 %] 92 % (08/23 0357) FiO2 (%):  [32 %] 32 % (08/22 2303)  Physical Exam: General: Age-appropriate, resting comfortably in chair, NAD, WNWD, alert and at baseline. HEENT: PERRLA, pupils 2-47mm.  EOM intact.  No ptosis, no conjunctival erythema or edema.  No scleral injections.  Visual acuity below.  No eye discharge. Cardiovascular: Regular rate and rhythm. Normal S1/S2. No murmurs, rubs, or gallops appreciated. 2+ radial pulses. Pulmonary: Clear bilaterally to ascultation. No increased WOB, no accessory muscle usage on 3L [place]. No wheezes, rales, or crackles. Skin: Warm and dry. Hypertrophic scars on chest.  No rashes grossly. Extremities: 1+ edema of LUE w/ moderate TTP, normal temp to touch, 2+ radial pulses bilaterally, LUE perfusing well.  1+ pitting peripheral edema RLE. Neuro: CNII-XII intact. No focal neurological deficit. Converses appropriately. A&Ox4.  Snellen chart visual acuity @ ~8-9 feet: 20/40 Both, 20/40 L eye, 20/30 R eye  Laboratory: Most recent CBC Lab Results  Component Value Date   WBC 6.0 07/17/2023   HGB 10.2 (L) 07/18/2023   HCT 30.0 (L) 07/18/2023   MCV 87.6 07/17/2023   PLT 111 (L) 07/17/2023   Most recent BMP    Latest Ref Rng & Units 07/20/2023    1:54 AM  BMP  Glucose 70 - 99 mg/dL 409   BUN 8 - 23 mg/dL 60   Creatinine 8.11 - 1.24 mg/dL 9.14   Sodium 782 - 956 mmol/L 130   Potassium 3.5 - 5.1 mmol/L 4.3   Chloride 98 - 111 mmol/L 90   CO2 22 - 32 mmol/L 25   Calcium 8.9 - 10.3 mg/dL 7.3    Other pertinent labs: - None  New  Imaging/Diagnostic Tests: - [place] LUE: No DVT - US carotid: Stenotic L subclavian, otherwise benign - MRI brain: Normal  Jeffrey Vo, MD 07/21/2023, 7:07 AM [facility]  FMTS Intern pager: [phone], text pages welcome Secure chat group [facility] Teaching Service

## 2023-07-21 NOTE — Progress Notes (Signed)
Subjective: Seen in room, left arm pain improving each day, right eye vision improving today, tolerated 3 L UF HD yesterday on schedule  Objective Vital signs in last 24 hours: Vitals:   07/20/23 1257 07/20/23 1500 07/20/23 1936 07/21/23 0357  BP:   (!) 159/89 (!) 150/69  Pulse: 86  88 78  Resp:   15 12  Temp: 98.2 F (36.8 C) (!) 97.5 F (36.4 C) 98.2 F (36.8 C) 98 F (36.7 C)  TempSrc: Oral Oral Oral Axillary  SpO2: 97% 97% 98% 92%  Weight:      Height:       Weight change:   Physical Exam: General: Alert, obese, well appearing man, NAD. Nasal O2 in place. Heart: RRR; no murmur Lungs: CTA anteriorly Abdomen: NABS, slightly distended, but soft, nontender Extremities: No RLE edema, L AKA, left upper extremity as below Dialysis Access: TDC in L chest, LUE AVF + bruit - arm edematous   OP HD  Orders: No outpatient unit, comes to [facility] on TTS schedule typically - 3:25hr, 3K, heparin 2000 unit bolus, EDW ~129kg   Problem/Plan: 1. L arm pain s/p L AVF infiltration/hematoma: Resting AVF for now; slow improvement in swelling, TDC in place. 2. ESRD: Continue HD on TTS schedule while here - does not have outpatient unit. HD now. 3. HTN/volume: BP fine, UF as tolerated 4. Anemia: Hgb 10.2, tsat 9%. Getting course of IV iron and Aranesp q Wed. 5. Secondary hyperparathyroidism: CorrCa ok, Phos 6.1 - continue home Phoslo as binder, starting calcitriol 0.43mcg daily and TUMS at bedtime.  6. T2DM: Insulin per primary. 7. R eye vision loss -improving today work-up per primary.  With ophthalmology seeing yesterday" needs follow-up outpatient and better diabetic control"  Jeffrey Pastel, PA-C [facility] Beeper [phone] 07/21/2023,8:34 AM  LOS: 4 days   Labs: Basic Metabolic Panel: Recent Labs  Lab 07/17/23 2359 07/18/23 1333 07/19/23 0312 07/20/23 0154  NA 133* 134* 131* 130*  K 3.5 3.6 3.6 4.3  CL 89* 88* 89* 90*  CO2 28  --  26 25  GLUCOSE 266* 248* 208* 481*   BUN 83* 98* 91* 60*  CREATININE 5.31* 5.70* 6.33* 5.21*  CALCIUM 6.9*  --  6.7* 7.3*  PHOS 6.4*  --  6.8* 6.1*   Liver Function Tests: Recent Labs  Lab 07/17/23 0830 07/17/23 2359 07/19/23 0312 07/20/23 0154  AST 15  --   --   --   ALT 14  --   --   --   ALKPHOS 84  --   --   --   BILITOT 0.4  --   --   --   PROT 7.1  --   --   --   ALBUMIN 3.5 3.4* 3.0* 3.0*   No results for input(s): "LIPASE", "AMYLASE" in the last 168 hours. No results for input(s): "AMMONIA" in the last 168 hours. CBC: Recent Labs  Lab 07/17/23 0830 07/18/23 1333  WBC 6.0  --   NEUTROABS 4.7  --   HGB 9.5* 10.2*  HCT 31.7* 30.0*  MCV 87.6  --   PLT 111*  --    Cardiac Enzymes: No results for input(s): "CKTOTAL", "CKMB", "CKMBINDEX", "TROPONINI" in the last 168 hours. CBG: Recent Labs  Lab 07/20/23 1542 07/20/23 2202 07/20/23 2329 07/21/23 0044 07/21/23 0737  GLUCAP 244* 506* >600* 512* 408*    Studies/Results: VAS [place] UPPER EXTREMITY VENOUS DUPLEX  Result Date: 07/20/2023 UPPER VENOUS STUDY  Patient Name:  Jeffrey [name]  Date of Exam:   07/20/2023 Medical Rec #: [id number]      Accession #:    [id number] Date of Birth: 02-17-1961      Patient Gender: M Patient Age:   62 years Exam Location:  [facility] Procedure:      VAS [place] UPPER EXTREMITY VENOUS DUPLEX Referring Phys: TODD MCDIARMID --------------------------------------------------------------------------------  Indications: Pain Risk Factors: Surgery AVF obesity. Limitations: Body habitus and AVF. Comparison Study: No prior study Performing Technologist: Shona Simpson  Examination Guidelines: A complete evaluation includes B-mode imaging, spectral Doppler, color Doppler, and power Doppler as needed of all accessible portions of each vessel. Bilateral testing is considered an integral part of a complete examination. Limited examinations for reoccurring indications may be performed as noted.  Right Findings:  +----------+------------+---------+-----------+----------+-------+ RIGHT     CompressiblePhasicitySpontaneousPropertiesSummary +----------+------------+---------+-----------+----------+-------+ Subclavian    Full       Yes       Yes                      +----------+------------+---------+-----------+----------+-------+  Left Findings: +----------+------------+---------+-----------+----------+-------+ LEFT      CompressiblePhasicitySpontaneousPropertiesSummary +----------+------------+---------+-----------+----------+-------+ IJV           Full       Yes       Yes                      +----------+------------+---------+-----------+----------+-------+ Subclavian    Full       Yes       Yes                      +----------+------------+---------+-----------+----------+-------+ Axillary      Full       Yes       Yes                      +----------+------------+---------+-----------+----------+-------+ Brachial      Full       Yes       Yes                      +----------+------------+---------+-----------+----------+-------+ Radial        Full       Yes       Yes                      +----------+------------+---------+-----------+----------+-------+ Ulnar         Full       Yes       Yes                      +----------+------------+---------+-----------+----------+-------+ Cephalic      Full       Yes       Yes                      +----------+------------+---------+-----------+----------+-------+ Basilic       Full       Yes       Yes                      +----------+------------+---------+-----------+----------+-------+  Summary:  Right: No evidence of thrombosis in the subclavian.  Left: No evidence of deep vein thrombosis in the upper extremity. No evidence of superficial vein thrombosis in the upper extremity.  *See table(s) above for measurements and observations.  Diagnosing physician: Coral Else MD Electronically signed by  Coral Else MD on 07/20/2023 at 9:42:02 PM.    Final    MR BRAIN WO CONTRAST  Result Date: 07/20/2023 CLINICAL DATA:  Vision loss, monocular. EXAM: MRI HEAD WITHOUT CONTRAST TECHNIQUE: Multiplanar, multiecho pulse sequences of the brain and surrounding structures were obtained without intravenous contrast. COMPARISON:  MRI brain 12/25/2021. FINDINGS: Brain: No acute infarct or hemorrhage. No mass or midline shift. No hydrocephalus or extra-axial collection. No abnormal susceptibility. Vascular: Normal flow voids. Skull and upper cervical spine: Normal marrow signal. Sinuses/Orbits: No acute findings. Other: None. IMPRESSION: Normal brain MRI. Electronically Signed   By: Orvan Falconer M.D.   On: 07/20/2023 21:40   VAS US CAROTID  Result Date: 07/20/2023 Carotid Arterial Duplex Study Patient Name:  Jeffrey [name]  Date of Exam:   07/20/2023 Medical Rec #: [id number]      Accession #:    [id number] Date of Birth: 01-30-1961      Patient Gender: M Patient Age:   52 years Exam Location:  [facility] Procedure:      VAS US CAROTID Referring Phys: TODD MCDIARMID --------------------------------------------------------------------------------  Indications:       Syncope. Risk Factors:      Hypertension, hyperlipidemia, Diabetes. Limitations        Today's exam was limited due to the body habitus of the                    patient and the patient's respiratory variation. Comparison Study:  No prior study Performing Technologist: Shona Simpson  Examination Guidelines: A complete evaluation includes B-mode imaging, spectral Doppler, color Doppler, and power Doppler as needed of all accessible portions of each vessel. Bilateral testing is considered an integral part of a complete examination. Limited examinations for reoccurring indications may be performed as noted.  Right Carotid Findings: +----------+--------+--------+--------+------------------+--------+           PSV cm/sEDV cm/sStenosisPlaque  DescriptionComments +----------+--------+--------+--------+------------------+--------+ CCA Prox  185     32                                         +----------+--------+--------+--------+------------------+--------+ CCA Distal90      21                                         +----------+--------+--------+--------+------------------+--------+ ICA Prox  105     36      1-39%   diffuse                    +----------+--------+--------+--------+------------------+--------+ ICA Mid   114     39                                         +----------+--------+--------+--------+------------------+--------+ ICA Distal123     51                                         +----------+--------+--------+--------+------------------+--------+ ECA       87      13                                         +----------+--------+--------+--------+------------------+--------+ +----------+--------+-------+--------+-------------------+  PSV cm/sEDV cmsDescribeArm Pressure (mmHG) +----------+--------+-------+--------+-------------------+ [id number]                                        +----------+--------+-------+--------+-------------------+ +---------+--------+--+--------+--+ VertebralPSV cm/s73EDV cm/s23 +---------+--------+--+--------+--+  Left Carotid Findings: +----------+--------+--------+--------+------------------+--------+           PSV cm/sEDV cm/sStenosisPlaque DescriptionComments +----------+--------+--------+--------+------------------+--------+ CCA Prox  162     37                                         +----------+--------+--------+--------+------------------+--------+ CCA Distal104     28                                         +----------+--------+--------+--------+------------------+--------+ ICA Prox  118     43      1-39%   hyperechoic                +----------+--------+--------+--------+------------------+--------+ ICA  Mid   129     43                                         +----------+--------+--------+--------+------------------+--------+ ICA Distal109     41                                         +----------+--------+--------+--------+------------------+--------+ ECA       98      16                                         +----------+--------+--------+--------+------------------+--------+ +----------+--------+--------+--------+-------------------+           PSV cm/sEDV cm/sDescribeArm Pressure (mmHG) +----------+--------+--------+--------+-------------------+ [id number]     106     Stenotic                    +----------+--------+--------+--------+-------------------+ +---------+--------+---+--------+--+ VertebralPSV cm/s101EDV cm/s32 +---------+--------+---+--------+--+   Summary: Right Carotid: Velocities in the right ICA are consistent with a 1-39% stenosis. Left Carotid: Velocities in the left ICA are consistent with a 1-39% stenosis. Subclavians: Left subclavian artery was stenotic. Normal flow hemodynamics were              seen in the right subclavian artery. *See table(s) above for measurements and observations.     Preliminary    Medications:  ferric gluconate (FERRLECIT) IVPB 135 mL/hr at 07/20/23 1809    acetaminophen  650 mg Oral Q6H   amLODipine  10 mg Oral q morning   aspirin EC  81 mg Oral Daily   atorvastatin  40 mg Oral Daily   calcitRIOL  0.25 mcg Oral Daily   calcium acetate  1,334 mg Oral TID with meals   calcium carbonate  1 tablet Oral TID   carvedilol  12.5 mg Oral BID WC   Chlorhexidine Gluconate Cloth  6 each Topical Q0600   clopidogrel  75 mg Oral Daily   darbepoetin (ARANESP) injection - DIALYSIS  40 mcg Subcutaneous Q Wed-1800  heparin  5,000 Units Subcutaneous Q8H   insulin aspart  0-20 Units Subcutaneous TID WC   insulin aspart  10 Units Subcutaneous TID WC   insulin glargine-yfgn  40 Units Subcutaneous BID   metolazone  10 mg Oral  Daily   nortriptyline  20 mg Oral QHS   pantoprazole  40 mg Oral QAC breakfast   predniSONE  30 mg Oral Q breakfast   pregabalin  75 mg Oral Daily   sertraline  100 mg Oral Daily   tamsulosin  0.4 mg Oral Daily   torsemide  120 mg Oral BID   traZODone  150 mg Oral QHS

## 2023-07-21 NOTE — Assessment & Plan Note (Addendum)
Pain still present, but improving daily.  Got 70 ME yesterday.  Still unclear etiology, but suspect infiltration during HD, gout still possibly contributing. - s/p [facility] placement 8/20 with Vascular, appreciate assistance - Pain control: Scheduled Tylenol, switch from Dilaudid to 0.5 mg IV Q3h to oxycodone 5 mg PO Q4h - Neurovascular checks Q4h - Blood Cx 8/19 NGTD@3d 

## 2023-07-21 NOTE — Progress Notes (Signed)
   07/21/23 2257  BiPAP/CPAP/SIPAP  $ Non-Invasive Ventilator  Non-Invasive Vent Subsequent  BiPAP/CPAP/SIPAP Pt Type Adult  BiPAP/CPAP/SIPAP Resmed  Mask Type Full face mask  Mask Size Medium  PEEP 10 cmH20  FiO2 (%) 32 %  Flow Rate 3 lpm  Patient Home Equipment No  Auto Titrate No  Nasal massage performed Yes  CPAP/SIPAP surface wiped down Yes  BiPAP/CPAP /SiPAP Vitals  BP (!) 166/93  Bilateral Breath Sounds Clear;Diminished  MEWS Score/Color  MEWS Score 0  MEWS Score Color Green

## 2023-07-21 NOTE — Plan of Care (Signed)
  Problem: Clinical Measurements: Goal: Will remain free from infection Outcome: Progressing Goal: Diagnostic test results will improve Outcome: Progressing Goal: Respiratory complications will improve Outcome: Progressing Goal: Cardiovascular complication will be avoided Outcome: Progressing   

## 2023-07-21 NOTE — Assessment & Plan Note (Signed)
 Patient with low TIBC and iron, elevated ferritin >500 in the setting of chronic dialysis.  On ESA and iron per Nepro recs. - On darbepoetin alfa per Nephro - Will receive iron infusions 4 days with HD per Pharmacy dosing

## 2023-07-21 NOTE — Assessment & Plan Note (Signed)
Does not use CPAP consistently.  On 3L at home for chronic respiratory failure.  Suspect OHS component as well.  Did use CPAP last night. - CPAP nightly

## 2023-07-21 NOTE — Assessment & Plan Note (Addendum)
T2DM on 70-125 units regular insulin BID with meals at home.  A1c 9.3 in July.  CBGs [phone] overnight, suspect consistent hyperglycemia due to prednisone.  Recived 60 U LA and 58 U SA in last 24 hours. - Increase to 40U LA BID, switch to home Humalin R 500 - Resistant SSI - Start 10U AC coverage - CBG monitoring Q4h - Start carb modified diet

## 2023-07-21 NOTE — Assessment & Plan Note (Addendum)
Uric acid 10.5, possible patient has some gout component to his LE pain. - Finished prednisone course today (8/23) after 3 doses

## 2023-07-21 NOTE — Assessment & Plan Note (Addendum)
S/p internal jugular tunneled cath 8/20, successful 3 L dialyzed 8/21.  T/Th/Sat HD schedule. - AM RFP and Mag per Nephro

## 2023-07-21 NOTE — Assessment & Plan Note (Addendum)
Workup negative for stroke with MRI, vascular US carotid and LUE.  Ophtho saw patient and signed off, suspect vision changes related to hyperglycemia.  No concern for globe injury, no corneal laceration. - Artificial tears PRN - Visual acuity test w/ Snellen if vision changing - Will follow up with Ophtho outpatient

## 2023-07-22 DIAGNOSIS — M79602 Pain in left arm: Secondary | ICD-10-CM | POA: Diagnosis not present

## 2023-07-22 LAB — CBC
HCT: 33.2 % — ABNORMAL LOW (ref 39.0–52.0)
Hemoglobin: 10.5 g/dL — ABNORMAL LOW (ref 13.0–17.0)
MCH: 26.5 pg (ref 26.0–34.0)
MCHC: 31.6 g/dL (ref 30.0–36.0)
MCV: 83.8 fL (ref 80.0–100.0)
Platelets: 135 10*3/uL — ABNORMAL LOW (ref 150–400)
RBC: 3.96 MIL/uL — ABNORMAL LOW (ref 4.22–5.81)
RDW: 15.9 % — ABNORMAL HIGH (ref 11.5–15.5)
WBC: 6.7 10*3/uL (ref 4.0–10.5)
nRBC: 0 % (ref 0.0–0.2)

## 2023-07-22 LAB — RENAL FUNCTION PANEL
Albumin: 3.4 g/dL — ABNORMAL LOW (ref 3.5–5.0)
Anion gap: 13 (ref 5–15)
BUN: 47 mg/dL — ABNORMAL HIGH (ref 8–23)
CO2: 24 mmol/L (ref 22–32)
Calcium: 8.5 mg/dL — ABNORMAL LOW (ref 8.9–10.3)
Chloride: 95 mmol/L — ABNORMAL LOW (ref 98–111)
Creatinine, Ser: 4.26 mg/dL — ABNORMAL HIGH (ref 0.61–1.24)
GFR, Estimated: 15 mL/min — ABNORMAL LOW (ref >60)
Glucose, Bld: 187 mg/dL — ABNORMAL HIGH (ref 70–99)
Phosphorus: 3.4 mg/dL (ref 2.5–4.6)
Potassium: 4 mmol/L (ref 3.5–5.1)
Sodium: 132 mmol/L — ABNORMAL LOW (ref 135–145)

## 2023-07-22 LAB — GLUCOSE, CAPILLARY
Glucose-Capillary: 275 mg/dL — ABNORMAL HIGH (ref 70–99)
Glucose-Capillary: 180 mg/dL — ABNORMAL HIGH (ref 70–99)
Glucose-Capillary: 200 mg/dL — ABNORMAL HIGH (ref 70–99)
Glucose-Capillary: 224 mg/dL — ABNORMAL HIGH (ref 70–99)

## 2023-07-22 MED ORDER — HYDROMORPHONE HCL 1 MG/ML IJ SOLN
0.5000 mg | Freq: Once | INTRAMUSCULAR | Status: AC
  Administered 2023-07-22: 0.5 mg via INTRAVENOUS
  Filled 2023-07-22: qty 1

## 2023-07-22 MED ORDER — OXYCODONE HCL 5 MG PO TABS
5.0000 mg | ORAL_TABLET | Freq: Once | ORAL | Status: AC
  Administered 2023-07-22: 5 mg via ORAL
  Filled 2023-07-22: qty 1

## 2023-07-22 MED ORDER — HEPARIN SODIUM (PORCINE) 1000 UNIT/ML IJ SOLN
4200.0000 [IU] | Freq: Once | INTRAMUSCULAR | Status: AC
  Administered 2023-07-22: 4200 [IU] via INTRAVENOUS
  Filled 2023-07-22: qty 5

## 2023-07-22 MED ORDER — INSULIN REGULAR HUMAN (CONC) 500 UNIT/ML ~~LOC~~ SOPN
45.0000 [IU] | PEN_INJECTOR | Freq: Two times a day (BID) | SUBCUTANEOUS | Status: DC
  Administered 2023-07-22 – 2023-07-23 (×2): 45 [IU] via SUBCUTANEOUS
  Filled 2023-07-22: qty 3

## 2023-07-22 MED ORDER — HEPARIN SODIUM (PORCINE) 1000 UNIT/ML IJ SOLN
2000.0000 [IU] | Freq: Once | INTRAMUSCULAR | Status: AC
  Administered 2023-07-22: 2000 [IU] via INTRAVENOUS
  Filled 2023-07-22: qty 2

## 2023-07-22 NOTE — Assessment & Plan Note (Signed)
 Patient with low TIBC and iron, elevated ferritin >500 in the setting of chronic dialysis.  On ESA and iron per Nepro recs. - On darbepoetin alfa per Nephro - Will receive iron infusions 4 days with HD per Pharmacy dosing

## 2023-07-22 NOTE — Assessment & Plan Note (Addendum)
Pain still present, attempted to try oxy and wasn't able to sleep well last night due to pain. Still unclear etiology, but suspect infiltration during HD, gout still possibly contributing. Prn oxy x3 last 24 hours  - s/p [facility] placement 8/20 with Vascular, appreciate assistance - Pain control: Scheduled Tylenol, oxycodone 5 mg PO Q4h - 0.5 mg Dilaudid ordered x1  - Neurovascular checks Q4h - Blood Cx 8/19 NGTD@4days 

## 2023-07-22 NOTE — Progress Notes (Signed)
Received patient in bed.Awake,alert and oriented x 4.Consent verified.  Access used.:Left HD catheter -arterial limb has positional problem,no blood return but flushes well on power flush,run treatment on reverse connection.  Medicine given :Heparin 2,000 units pre-run dose.                           Ferric gluconate 250 mg=270 cc.  Duration of treatment:3.25 hours.  Fluid removed: 3 liters.  Hemo comment:Tolerated treatment.  Hand off to the patient's nurse.

## 2023-07-22 NOTE — Assessment & Plan Note (Signed)
S/p internal jugular tunneled cath 8/20, successful 3 L dialyzed 8/21.  T/Th/Sat HD schedule. - HD today  - f/u AM RFP and Mag per Nephro

## 2023-07-22 NOTE — Assessment & Plan Note (Signed)
Does not use CPAP consistently.  On 3L at home for chronic respiratory failure.  Suspect OHS component as well. . - CPAP nightly

## 2023-07-22 NOTE — Progress Notes (Signed)
Dr. Rexene Alberts paged at 2114 and 0353 due to uncontrolled pain with current regimen. 5mg  oxy x1 ordered both times.

## 2023-07-22 NOTE — Progress Notes (Signed)
Daily Progress Note Intern Pager: [phone]  Patient name: Jeffrey [name] Medical record number: [id number] Date of birth: 11-07-1961 Age: 62 y.o. Gender: male  Primary Care Provider: Knox Royalty, MD Consultants: Nephrology  Code Status: Full code   Pt Overview and Major Events to Date:  8/19 - Admitted, having severe pain out of proportion in LUE 8/20 - Difficult tunneled internal jugular cath placement, HD with poorly sustained flow 8/21 - Started prednisone, successful HD w/ 3L dialyzed 8/22 - Drowsy overnight, hyperglycemic; increased LA and SA insulin, decreased Dilaudid. New R eye pain and visual deficit, Ophtho called and evaluated, stroke workup negative 8/23 - Switch to PO opiates, increasing insulin further 8/24 - HD today, increased insulin further, Dilaudid x1   Assessment and Plan: Jeffrey [name] is a 62 y.o. male with a pertinent PMH of ESRD on HD T/Th/Sat, OSA, and PTSD who presented with LUE pain s/p HD and was admitted for [facility] placement, now s/p tunneled internal jugular catheter placement 8/20 and with new R eye symptoms and hyperglycemia. Patient continues to have pain overnight with oxy prn. One time dose of dilaudid  ordered for pain control, anticipate possible discharge tomorrow.  Assessment & Plan Left arm pain Pain still present, attempted to try oxy and wasn't able to sleep well last night due to pain. Still unclear etiology, but suspect infiltration during HD, gout still possibly contributing. Prn oxy x3 last 24 hours  - s/p [facility] placement 8/20 with Vascular, appreciate assistance - Pain control: Scheduled Tylenol, oxycodone 5 mg PO Q4h - 0.5 mg Dilaudid ordered x1  - Neurovascular checks Q4h - Blood Cx 8/19 NGTD@4days   ESRD on dialysis [facility]) S/p internal jugular tunneled cath 8/20, successful 3 L dialyzed 8/21.  T/Th/Sat HD schedule. - HD today  - f/u AM RFP and Mag per Nephro Visual field defect of right eye Workup negative for stroke with MRI,  vascular US carotid and LUE.  Ophtho saw patient and signed off, suspect vision changes related to hyperglycemia.  No concern for globe injury, no corneal laceration. - Artificial tears PRN - Visual acuity test w/ Snellen if vision changing - Will follow up with Ophtho outpatient Type 2 diabetes mellitus with hyperlipidemia (HCC) T2DM on 70-125 units regular insulin BID with meals at home. A1c 9.3 in July.  CBGs up to 427 last night, FSBS 275  suspect consistent hyperglycemia due to prednisone. 80u LAI and 80 units of SAI yesterday.  - 45U LA BID  Humalin R 500 - Resistant SSI - 10U AC coverage - CBG monitoring Q4h - Start carb modified diet OSA (obstructive sleep apnea) Does not use CPAP consistently.  On 3L at home for chronic respiratory failure.  Suspect OHS component as well. . - CPAP nightly Anemia due to chronic kidney disease, on chronic dialysis [facility]) Patient with low TIBC and iron, elevated ferritin >500 in the setting of chronic dialysis.  On ESA and iron per Nepro recs. - On darbepoetin alfa per Nephro - Will receive iron infusions 4 days with HD per Pharmacy dosing Hyperuricemia Uric acid 10.5, possible patient has some gout component to his LE pain. - Completed prednisone course 8/23  FEN/GI: Carb PPx: Heparin  Dispo:Home tomorrow pending     Subjective:  Patient reports poor pain control last night. Doesn't expect to be pain free, however wasn't able to sleep well due to pain. Denies fevers, chills, nausea or emesis.   Objective: Temp:  [97.6 F (36.4 C)-98.6 F (37  C)] 98 F (36.7 C) (08/24 0823) Pulse Rate:  [64-85] 76 (08/24 0823) Resp:  [10-18] 10 (08/24 0823) BP: (151-172)/(76-99) 169/76 (08/24 0823) SpO2:  [98 %-100 %] 100 % (08/24 0823) FiO2 (%):  [32 %] 32 % (08/23 2257) Weight:  [293 lb 3.4 oz (133 kg)] 293 lb 3.4 oz (133 kg) (08/24 9147) Physical Exam:  General: Age-appropriate, resting comfortably in chair, NAD, WNWD, alert and at baseline. HEENT:   EOM intact.  No ptosis, no conjunctival erythema or edema.  No scleral injections.    Cardiovascular: Regular rate and rhythm. Normal S1/S2. No murmurs, rubs, or gallops appreciated. 2+ radial pulses. Pulmonary: Clear bilaterally to ascultation. No increased WOB, 3L [place]. No wheezes, rales, or crackles. Skin: Warm and dry. Hypertrophic scars on chest.  No rashes grossly. Extremities: 1+ edema of LUE w/ moderate TTP, normal temp to touch, 2+ radial pulses bilaterally, LUE perfusing well.  1+ pitting peripheral edema RLE.  Laboratory: Most recent CBC Lab Results  Component Value Date   WBC 6.0 07/17/2023   HGB 10.2 (L) 07/18/2023   HCT 30.0 (L) 07/18/2023   MCV 87.6 07/17/2023   PLT 111 (L) 07/17/2023   Most recent BMP    Latest Ref Rng & Units 07/20/2023    1:54 AM  BMP  Glucose 70 - 99 mg/dL 829   BUN 8 - 23 mg/dL 60   Creatinine 5.62 - 1.24 mg/dL 1.30   Sodium 865 - 784 mmol/L 130   Potassium 3.5 - 5.1 mmol/L 4.3   Chloride 98 - 111 mmol/L 90   CO2 22 - 32 mmol/L 25   Calcium 8.9 - 10.3 mg/dL 7.3     Other pertinent labs None    Imaging/Diagnostic Tests: - [place] LUE: No DVT - US carotid: Stenotic L subclavian, otherwise benign - MRI brain: Normal  Peterson Ao, MD 07/22/2023, 8:54 AM  PGY-1, [facility] FPTS Intern pager: [phone], text pages welcome Secure chat group [facility] Teaching Service

## 2023-07-22 NOTE — Assessment & Plan Note (Addendum)
T2DM on 70-125 units regular insulin BID with meals at home. A1c 9.3 in July.  CBGs up to 427 last night, FSBS 275  suspect consistent hyperglycemia due to prednisone. 80u LAI and 80 units of SAI yesterday.  - 45U LA BID  Humalin R 500 - Resistant SSI - 10U AC coverage - CBG monitoring Q4h - Start carb modified diet

## 2023-07-22 NOTE — Progress Notes (Signed)
Subjective: Seen on HD, complaining of left arm pain.  Currently tolerating UF  Objective Vital signs in last 24 hours: Vitals:   07/21/23 2257 07/22/23 0220 07/22/23 0754 07/22/23 0823  BP: (!) 166/93 (!) 168/81 (!) 172/85 (!) 169/76  Pulse:  64 83 76  Resp:  18 13 10   Temp:  98.4 F (36.9 C) 97.9 F (36.6 C) 98 F (36.7 C)  TempSrc:  Oral Oral Oral  SpO2:  98% 98% 100%  Weight:    133 kg  Height:       Weight change:    Physical Exam: General: Alert, obese, well appearing man, NAD. [place]  O2 3L Heart: RRR; no murmur Lungs: CTA ant. nonlabored 3 L [place] O2 Abdomen: NABS, slightly distended, but soft, nontender Extremities: No RLE edema, L AKA, left upper extremity as below Dialysis Access: TDC L chest patent on HD, LUE AVF + bruit - arm edematous     OP HD  Orders: No outpatient unit, comes to [facility] on TTS schedule typically - 3:25hr, 3K, heparin 2000 unit bolus, EDW ~129kg   Problem/Plan: 1. L arm pain s/p L AVF infiltration/hematoma: Resting AVF for now; slow improvement in swelling, TDC in place. 2. ESRD: Continue HD on TTS schedule while here - does not have outpatient unit. HD on schedule now. (Will DC metolazone  ESRD patient, does have some UOP with torsemide continue) 3. HTN/volume: BP some elevation questionable pain component, UF as tolerated/on amlodipine, carvedilol 4. Anemia: Hgb 10.2, Hgb pending this morning ,tsat 9%. Getting course of IV iron and Aranesp q Wed. 5. Secondary hyperparathyroidism: CorrCa ok, Phos 6.1 - continue home Phoslo as binder, starting calcitriol 0.38mcg daily and TUMS at bedtime.  A.m. labs pending. Pre  HD 6. T2DM: Insulin per primary. 7. R eye vision loss -improving today work-up per primary.  With ophthalmology eval 8/22" needs follow-up outpatient and better diabetic control"  Lenny Pastel, PA-C [facility] [phone] 07/22/2023,9:18 AM  LOS: 5 days   Labs: Basic Metabolic Panel: Recent Labs  Lab 07/17/23 2359  07/18/23 1333 07/19/23 0312 07/20/23 0154  NA 133* 134* 131* 130*  K 3.5 3.6 3.6 4.3  CL 89* 88* 89* 90*  CO2 28  --  26 25  GLUCOSE 266* 248* 208* 481*  BUN 83* 98* 91* 60*  CREATININE 5.31* 5.70* 6.33* 5.21*  CALCIUM 6.9*  --  6.7* 7.3*  PHOS 6.4*  --  6.8* 6.1*   Liver Function Tests: Recent Labs  Lab 07/17/23 0830 07/17/23 2359 07/19/23 0312 07/20/23 0154  AST 15  --   --   --   ALT 14  --   --   --   ALKPHOS 84  --   --   --   BILITOT 0.4  --   --   --   PROT 7.1  --   --   --   ALBUMIN 3.5 3.4* 3.0* 3.0*   No results for input(s): "LIPASE", "AMYLASE" in the last 168 hours. No results for input(s): "AMMONIA" in the last 168 hours. CBC: Recent Labs  Lab 07/17/23 0830 07/18/23 1333  WBC 6.0  --   NEUTROABS 4.7  --   HGB 9.5* 10.2*  HCT 31.7* 30.0*  MCV 87.6  --   PLT 111*  --    Cardiac Enzymes: No results for input(s): "CKTOTAL", "CKMB", "CKMBINDEX", "TROPONINI" in the last 168 hours. CBG: Recent Labs  Lab 07/21/23 0840 07/21/23 1128 07/21/23 1804 07/21/23 2119 07/22/23 1478  GLUCAP 406* 370* 398* 427* 275*    Studies/Results: VAS [place] UPPER EXTREMITY VENOUS DUPLEX  Result Date: 07/20/2023 UPPER VENOUS STUDY  Patient Name:  Jeffrey [name]  Date of Exam:   07/20/2023 Medical Rec #: [id number]      Accession #:    [id number] Date of Birth: [birth year]      Patient Gender: M Patient Age:   62 years Exam Location:  [facility] Procedure:      VAS [place] UPPER EXTREMITY VENOUS DUPLEX Referring Phys: TODD MCDIARMID --------------------------------------------------------------------------------  Indications: Pain Risk Factors: Surgery AVF obesity. Limitations: Body habitus and AVF. Comparison Study: No prior study Performing Technologist: Shona Simpson  Examination Guidelines: A complete evaluation includes B-mode imaging, spectral Doppler, color Doppler, and power Doppler as needed of all accessible portions of each vessel. Bilateral testing is considered an  integral part of a complete examination. Limited examinations for reoccurring indications may be performed as noted.  Right Findings: +----------+------------+---------+-----------+----------+-------+ RIGHT     CompressiblePhasicitySpontaneousPropertiesSummary +----------+------------+---------+-----------+----------+-------+ Subclavian    Full       Yes       Yes                      +----------+------------+---------+-----------+----------+-------+  Left Findings: +----------+------------+---------+-----------+----------+-------+ LEFT      CompressiblePhasicitySpontaneousPropertiesSummary +----------+------------+---------+-----------+----------+-------+ IJV           Full       Yes       Yes                      +----------+------------+---------+-----------+----------+-------+ Subclavian    Full       Yes       Yes                      +----------+------------+---------+-----------+----------+-------+ Axillary      Full       Yes       Yes                      +----------+------------+---------+-----------+----------+-------+ Brachial      Full       Yes       Yes                      +----------+------------+---------+-----------+----------+-------+ Radial        Full       Yes       Yes                      +----------+------------+---------+-----------+----------+-------+ Ulnar         Full       Yes       Yes                      +----------+------------+---------+-----------+----------+-------+ Cephalic      Full       Yes       Yes                      +----------+------------+---------+-----------+----------+-------+ Basilic       Full       Yes       Yes                      +----------+------------+---------+-----------+----------+-------+  Summary:  Right: No evidence of thrombosis in the subclavian.  Left: No evidence of deep vein thrombosis in the  upper extremity. No evidence of superficial vein thrombosis in the upper  extremity.  *See table(s) above for measurements and observations.  Diagnosing physician: Coral Else MD Electronically signed by Coral Else MD on 07/20/2023 at 9:42:02 PM.    Final    MR BRAIN WO CONTRAST  Result Date: 07/20/2023 CLINICAL DATA:  Vision loss, monocular. EXAM: MRI HEAD WITHOUT CONTRAST TECHNIQUE: Multiplanar, multiecho pulse sequences of the brain and surrounding structures were obtained without intravenous contrast. COMPARISON:  MRI brain 12/25/2021. FINDINGS: Brain: No acute infarct or hemorrhage. No mass or midline shift. No hydrocephalus or extra-axial collection. No abnormal susceptibility. Vascular: Normal flow voids. Skull and upper cervical spine: Normal marrow signal. Sinuses/Orbits: No acute findings. Other: None. IMPRESSION: Normal brain MRI. Electronically Signed   By: Orvan Falconer M.D.   On: 07/20/2023 21:40   VAS US CAROTID  Result Date: 07/20/2023 Carotid Arterial Duplex Study Patient Name:  Jeffrey [name]  Date of Exam:   07/20/2023 Medical Rec #: [id number]      Accession #:    [id number] Date of Birth: 1961-05-15      Patient Gender: M Patient Age:   62 years Exam Location:  [facility] Procedure:      VAS US CAROTID Referring Phys: TODD MCDIARMID --------------------------------------------------------------------------------  Indications:       Syncope. Risk Factors:      Hypertension, hyperlipidemia, Diabetes. Limitations        Today's exam was limited due to the body habitus of the                    patient and the patient's respiratory variation. Comparison Study:  No prior study Performing Technologist: Shona Simpson  Examination Guidelines: A complete evaluation includes B-mode imaging, spectral Doppler, color Doppler, and power Doppler as needed of all accessible portions of each vessel. Bilateral testing is considered an integral part of a complete examination. Limited examinations for reoccurring indications may be performed as noted.  Right Carotid  Findings: +----------+--------+--------+--------+------------------+--------+           PSV cm/sEDV cm/sStenosisPlaque DescriptionComments +----------+--------+--------+--------+------------------+--------+ CCA Prox  185     32                                         +----------+--------+--------+--------+------------------+--------+ CCA Distal90      21                                         +----------+--------+--------+--------+------------------+--------+ ICA Prox  105     36      1-39%   diffuse                    +----------+--------+--------+--------+------------------+--------+ ICA Mid   114     39                                         +----------+--------+--------+--------+------------------+--------+ ICA Distal123     51                                         +----------+--------+--------+--------+------------------+--------+ ECA  87      13                                         +----------+--------+--------+--------+------------------+--------+ +----------+--------+-------+--------+-------------------+           PSV cm/sEDV cmsDescribeArm Pressure (mmHG) +----------+--------+-------+--------+-------------------+ [id number]                                        +----------+--------+-------+--------+-------------------+ +---------+--------+--+--------+--+ VertebralPSV cm/s73EDV cm/s23 +---------+--------+--+--------+--+  Left Carotid Findings: +----------+--------+--------+--------+------------------+--------+           PSV cm/sEDV cm/sStenosisPlaque DescriptionComments +----------+--------+--------+--------+------------------+--------+ CCA Prox  162     37                                         +----------+--------+--------+--------+------------------+--------+ CCA Distal104     28                                         +----------+--------+--------+--------+------------------+--------+ ICA Prox  118      43      1-39%   hyperechoic                +----------+--------+--------+--------+------------------+--------+ ICA Mid   129     43                                         +----------+--------+--------+--------+------------------+--------+ ICA Distal109     41                                         +----------+--------+--------+--------+------------------+--------+ ECA       98      16                                         +----------+--------+--------+--------+------------------+--------+ +----------+--------+--------+--------+-------------------+           PSV cm/sEDV cm/sDescribeArm Pressure (mmHG) +----------+--------+--------+--------+-------------------+ [id number]     106     Stenotic                    +----------+--------+--------+--------+-------------------+ +---------+--------+---+--------+--+ VertebralPSV cm/s101EDV cm/s32 +---------+--------+---+--------+--+   Summary: Right Carotid: Velocities in the right ICA are consistent with a 1-39% stenosis. Left Carotid: Velocities in the left ICA are consistent with a 1-39% stenosis. Subclavians: Left subclavian artery was stenotic. Normal flow hemodynamics were              seen in the right subclavian artery. *See table(s) above for measurements and observations.     Preliminary    Medications:  ferric gluconate (FERRLECIT) IVPB 135 mL/hr at 07/20/23 1809    acetaminophen  650 mg Oral Q6H   amLODipine  10 mg Oral q morning   aspirin EC  81 mg Oral Daily   atorvastatin  40 mg Oral Daily   calcitRIOL  0.25 mcg Oral Daily  calcium acetate  1,334 mg Oral TID with meals   calcium carbonate  1 tablet Oral TID   carvedilol  12.5 mg Oral BID WC   Chlorhexidine Gluconate Cloth  6 each Topical Q0600   clopidogrel  75 mg Oral Daily   darbepoetin (ARANESP) injection - DIALYSIS  40 mcg Subcutaneous Q Wed-1800   heparin  5,000 Units Subcutaneous Q8H   heparin sodium (porcine)  2,000 Units Intravenous  Once   heparin sodium (porcine)  4,200 Units Intravenous Once   insulin aspart  0-20 Units Subcutaneous TID WC   insulin aspart  10 Units Subcutaneous TID WC   insulin regular human CONCENTRATED  40 Units Subcutaneous BID WC   metolazone  10 mg Oral Daily   nortriptyline  20 mg Oral QHS   pantoprazole  40 mg Oral QAC breakfast   pregabalin  75 mg Oral Daily   sertraline  100 mg Oral Daily   tamsulosin  0.4 mg Oral Daily   torsemide  120 mg Oral BID   traZODone  150 mg Oral QHS

## 2023-07-22 NOTE — Assessment & Plan Note (Signed)
Workup negative for stroke with MRI, vascular US carotid and LUE.  Ophtho saw patient and signed off, suspect vision changes related to hyperglycemia.  No concern for globe injury, no corneal laceration. - Artificial tears PRN - Visual acuity test w/ Snellen if vision changing - Will follow up with Ophtho outpatient

## 2023-07-22 NOTE — Assessment & Plan Note (Signed)
Uric acid 10.5, possible patient has some gout component to his LE pain. - Completed prednisone course 8/23

## 2023-07-23 DIAGNOSIS — M79602 Pain in left arm: Secondary | ICD-10-CM | POA: Diagnosis not present

## 2023-07-23 LAB — GLUCOSE, CAPILLARY
Glucose-Capillary: 233 mg/dL — ABNORMAL HIGH (ref 70–99)
Glucose-Capillary: 184 mg/dL — ABNORMAL HIGH (ref 70–99)

## 2023-07-23 LAB — CULTURE, BLOOD (ROUTINE X 2)
Culture: NO GROWTH
Culture: NO GROWTH
Special Requests: ADEQUATE
Special Requests: ADEQUATE

## 2023-07-23 MED ORDER — OXYCODONE HCL 5 MG PO TABS: 5.0000 mg | ORAL_TABLET | ORAL | 0 refills | Status: DC | PRN

## 2023-07-23 MED ORDER — ACETAMINOPHEN 325 MG PO TABS: 650.0000 mg | ORAL_TABLET | Freq: Four times a day (QID) | ORAL | 0 refills | Status: DC

## 2023-07-23 NOTE — Assessment & Plan Note (Signed)
 Patient with low TIBC and iron, elevated ferritin >500 in the setting of chronic dialysis.  On ESA and iron per Nepro recs. - On darbepoetin alfa per Nephro - Will receive iron infusions 4 days with HD per Pharmacy dosing

## 2023-07-23 NOTE — Assessment & Plan Note (Signed)
Workup negative for stroke with MRI, vascular US carotid and LUE.  Ophtho saw patient and signed off, suspect vision changes related to hyperglycemia.  No concern for globe injury, no corneal laceration. - Artificial tears PRN - Visual acuity test w/ Snellen if vision changing - Will follow up with Ophtho outpatient

## 2023-07-23 NOTE — Progress Notes (Cosign Needed)
Subjective: Seen in room eating breakfast, said tolerated dialysis yesterday 3 L UF, continued Left upper arm /mostly near  AV fistula swelling discomfort, swelling improving.  Objective Vital signs in last 24 hours: Vitals:   07/23/23 0119 07/23/23 0312 07/23/23 0318 07/23/23 0748  BP: (!) 148/70 (!) 140/83 (!) 156/78 (!) 144/75  Pulse: 75 74 75 76  Resp: 11 12 20 16   Temp:   97.8 F (36.6 C) 97.8 F (36.6 C)  TempSrc:  Oral Oral Oral  SpO2: 98% 98% 95% 99%  Weight:      Height:       Weight change:   Physical Exam: General: Alert, obese, NAD. [place]  O2 3L(chronic home use O2) Heart: RRR; no murmur Lungs: CTA ant. nonlabored 3 L [place] O2 Abdomen: NABS, obese , soft, nontender Extremities: No RLE edema, L AKA, left upper extr.  with swelling to the hand and slowly improving, hand temperature warm Dialysis Access: Left IJ TDC dressing dry clear, LUE AVF + bruit - arm swelling improving slowly     OP HD  Orders: No outpatient unit, comes to [facility] on TTS schedule typically - 3:25hr, 3K, heparin 2000 unit bolus, EDW ~129kg   Problem/Plan: 1. L arm pain s/p L AVF infiltration/hematoma: Resting AVF for now; slow improvement in swelling, TDC in place. 2. ESRD: Continue HD on TTS schedule while here - does not have outpatient unit. HD on sch. /On torsemide with significant urine output 3. HTN/volume: BP some elevation questionable pain component, UF as tolerated/on amlodipine, carvedilol 4. Anemia: Hgb 10.5, Hgb ,tsat 9%. On  course of IV iron and 40 mcg Aranesp q Wed. 5. Sec.hyperparathyroidism/preadmit hypocalcemia: CorrCa ok, Phos 3.4- continue home Phoslo as binder, starting calcitriol 0.82mcg daily and TUMS at bedtime.   6. T2DM: Insulin per primary. 7. R eye vision loss -improving today work-up per primary.  With ophthalmology eval 8/22" needs follow-up outpatient and better diabetic control"  Lenny Pastel, PA-C [facility]per [phone] 07/23/2023,8:55 AM  LOS: 6  days   Labs: Basic Metabolic Panel: Recent Labs  Lab 07/19/23 0312 07/20/23 0154 07/22/23 1307  NA 131* 130* 132*  K 3.6 4.3 4.0  CL 89* 90* 95*  CO2 26 25 24   GLUCOSE 208* 481* 187*  BUN 91* 60* 47*  CREATININE 6.33* 5.21* 4.26*  CALCIUM 6.7* 7.3* 8.5*  PHOS 6.8* 6.1* 3.4   Liver Function Tests: Recent Labs  Lab 07/17/23 0830 07/17/23 2359 07/19/23 0312 07/20/23 0154 07/22/23 1307  AST 15  --   --   --   --   ALT 14  --   --   --   --   ALKPHOS 84  --   --   --   --   BILITOT 0.4  --   --   --   --   PROT 7.1  --   --   --   --   ALBUMIN 3.5   < > 3.0* 3.0* 3.4*   < > = values in this interval not displayed.   No results for input(s): "LIPASE", "AMYLASE" in the last 168 hours. No results for input(s): "AMMONIA" in the last 168 hours. CBC: Recent Labs  Lab 07/17/23 0830 07/18/23 1333 07/22/23 1307  WBC 6.0  --  6.7  NEUTROABS 4.7  --   --   HGB 9.5* 10.2* 10.5*  HCT 31.7* 30.0* 33.2*  MCV 87.6  --  83.8  PLT 111*  --  135*  Cardiac Enzymes: No results for input(s): "CKTOTAL", "CKMB", "CKMBINDEX", "TROPONINI" in the last 168 hours. CBG: Recent Labs  Lab 07/22/23 0650 07/22/23 1237 07/22/23 1655 07/22/23 2106 07/23/23 0808  GLUCAP 275* 180* 200* 224* 233*    Studies/Results: No results found. Medications:  ferric gluconate (FERRLECIT) IVPB Stopped (07/22/23 1230)    acetaminophen  650 mg Oral Q6H   amLODipine  10 mg Oral q morning   aspirin EC  81 mg Oral Daily   atorvastatin  40 mg Oral Daily   calcitRIOL  0.25 mcg Oral Daily   calcium acetate  1,334 mg Oral TID with meals   calcium carbonate  1 tablet Oral TID   carvedilol  12.5 mg Oral BID WC   Chlorhexidine Gluconate Cloth  6 each Topical Q0600   clopidogrel  75 mg Oral Daily   darbepoetin (ARANESP) injection - DIALYSIS  40 mcg Subcutaneous Q Wed-1800   heparin  5,000 Units Subcutaneous Q8H   insulin aspart  0-20 Units Subcutaneous TID WC   insulin aspart  10 Units Subcutaneous TID  WC   insulin regular human CONCENTRATED  45 Units Subcutaneous BID WC   nortriptyline  20 mg Oral QHS   pantoprazole  40 mg Oral QAC breakfast   pregabalin  75 mg Oral Daily   sertraline  100 mg Oral Daily   tamsulosin  0.4 mg Oral Daily   torsemide  120 mg Oral BID   traZODone  150 mg Oral QHS

## 2023-07-23 NOTE — Assessment & Plan Note (Signed)
Still unclear etiology, but suspect infiltration during HD, gout still possibly contributing. - s/p [facility] placement 8/20 with Vascular, appreciate assistance - Pain control: Scheduled Tylenol, oxycodone 5 mg PO Q4h - Blood Cx 8/19 NGTD@4days 

## 2023-07-23 NOTE — Progress Notes (Signed)
Daily Progress Note Intern Pager: [phone]  Patient name: Jeffrey [name] Medical record number: [id number] Date of birth: 10-Jul-1961 Age: 62 y.o. Gender: male  Primary Care Provider: Knox Royalty, MD Consultants: nephrology  Code Status: Full code  Pt Overview and Major Events to Date:  8/19 - Admitted, having severe pain out of proportion in LUE 8/20 - Difficult tunneled internal jugular cath placement, HD with poorly sustained flow 8/21 - Started prednisone, successful HD w/ 3L dialyzed 8/22 - Drowsy overnight, hyperglycemic; increased LA and SA insulin, decreased Dilaudid. New R eye pain and visual deficit, Ophtho called and evaluated, stroke workup negative 8/23 - Switch to PO opiates, increasing insulin further 8/24 - HD today, increased insulin further, Dilaudid x1  Assessment and Plan:  Jeffrey [name] is a 62 y.o. male with a pertinent PMH of ESRD on HD T/Th/Sat, OSA, and PTSD who presented with LUE pain s/p HD and was admitted for [facility] placement, now s/p tunneled internal jugular catheter placement 8/20 and with new R eye symptoms and hyperglycemia.   Patient did well on PO oxycodone yesterday and overnight. Unfortunately no clear cause of his right arm pain, but emergent and acute etiologies have been ruled out. He is medically stable for discharge.    Assessment & Plan Left arm pain Still unclear etiology, but suspect infiltration during HD, gout still possibly contributing. - s/p [facility] placement 8/20 with Vascular, appreciate assistance - Pain control: Scheduled Tylenol, oxycodone 5 mg PO Q4h - Blood Cx 8/19 NGTD@4days   ESRD on dialysis [facility]) S/p internal jugular tunneled cath 8/20, successful 3 L dialyzed 8/21.  T/Th/Sat HD schedule. - HD today  - f/u AM RFP and Mag per Nephro Visual field defect of right eye Workup negative for stroke with MRI, vascular US carotid and LUE.  Ophtho saw patient and signed off, suspect vision changes related to hyperglycemia.  No  concern for globe injury, no corneal laceration. - Artificial tears PRN - Visual acuity test w/ Snellen if vision changing - Will follow up with Ophtho outpatient Type 2 diabetes mellitus with hyperlipidemia (HCC) T2DM on 70-125 units regular insulin BID with meals at home. A1c 9.3 in July.  CBGs up to 427 last night, FSBS 275  suspect consistent hyperglycemia due to prednisone. 85u LAI and 45 units of SAI yesterday.  - 45U LA BID  Humalin R 500 - Resistant SSI - 10U AC coverage - CBG monitoring Q4h - Start carb modified diet OSA (obstructive sleep apnea) Does not use CPAP consistently.  On 3L at home for chronic respiratory failure.  Suspect OHS component as well. . - CPAP nightly Anemia due to chronic kidney disease, on chronic dialysis [facility]) Patient with low TIBC and iron, elevated ferritin >500 in the setting of chronic dialysis.  On ESA and iron per Nepro recs. - On darbepoetin alfa per Nephro - Will receive iron infusions 4 days with HD per Pharmacy dosing Hyperuricemia Uric acid 10.5, possible patient has some gout component to his LE pain. - Completed prednisone course 8/23     FEN/GI: Carb modified  PPx: heparin  Dispo:Home today. Barriers include ride back home.   Subjective:  NAEO, resting comfortably on exam this morning. Patient medically stable for discharge.   Objective: Temp:  [97.7 F (36.5 C)-98.6 F (37 C)] 97.8 F (36.6 C) (08/25 0318) Pulse Rate:  [73-85] 75 (08/25 0318) Resp:  [10-20] 20 (08/25 0318) BP: (139-186)/(70-98) 156/78 (08/25 0318) SpO2:  [95 %-100 %] 95 % (  08/25 0318) FiO2 (%):  [32 %] 32 % (08/24 2126) Weight:  [130 kg-133 kg] 130 kg (08/24 1203) Physical Exam: General: well appearing, no acute distress, resting comfortably  CV: regular rate, regular rhythm, no murmurs on exam  Pulm: clear, no wheezing, no increased work of breathing  Abd: soft, non-tender, non-distended  Skin: warm, dry Ext: moves all four spontaneously, good tone    Laboratory: Most recent CBC Lab Results  Component Value Date   WBC 6.7 07/22/2023   HGB 10.5 (L) 07/22/2023   HCT 33.2 (L) 07/22/2023   MCV 83.8 07/22/2023   PLT 135 (L) 07/22/2023   Most recent BMP    Latest Ref Rng & Units 07/22/2023    1:07 PM  BMP  Glucose 70 - 99 mg/dL 562   BUN 8 - 23 mg/dL 47   Creatinine 1.30 - 1.24 mg/dL 8.65   Sodium 784 - 696 mmol/L 132   Potassium 3.5 - 5.1 mmol/L 4.0   Chloride 98 - 111 mmol/L 95   CO2 22 - 32 mmol/L 24   Calcium 8.9 - 10.3 mg/dL 8.5    Imaging/Diagnostic Tests: - [place] LUE: No DVT - US carotid: Stenotic L subclavian, otherwise benign - MRI brain: Normal  Glendale Chard, DO 07/23/2023, 6:02 AM  PGY-2, [facility] FPTS Intern pager: [phone], text pages welcome Secure chat group [facility] Teaching Service

## 2023-07-23 NOTE — Plan of Care (Signed)
  Problem: Clinical Measurements: Goal: Ability to maintain clinical measurements within normal limits will improve Outcome: Progressing Goal: Will remain free from infection Outcome: Progressing Goal: Diagnostic test results will improve Outcome: Progressing Goal: Respiratory complications will improve Outcome: Progressing Goal: Cardiovascular complication will be avoided Outcome: Progressing   Problem: Nutrition: Goal: Adequate nutrition will be maintained Outcome: Progressing   Problem: Pain Managment: Goal: General experience of comfort will improve Outcome: Progressing   Problem: Safety: Goal: Ability to remain free from injury will improve Outcome: Progressing   Problem: Skin Integrity: Goal: Risk for impaired skin integrity will decrease Outcome: Progressing   

## 2023-07-23 NOTE — Assessment & Plan Note (Signed)
Uric acid 10.5, possible patient has some gout component to his LE pain. - Completed prednisone course 8/23

## 2023-07-23 NOTE — Assessment & Plan Note (Signed)
T2DM on 70-125 units regular insulin BID with meals at home. A1c 9.3 in July.  CBGs up to 427 last night, FSBS 275  suspect consistent hyperglycemia due to prednisone. 85u LAI and 45 units of SAI yesterday.  - 45U LA BID  Humalin R 500 - Resistant SSI - 10U AC coverage - CBG monitoring Q4h - Start carb modified diet

## 2023-07-23 NOTE — Assessment & Plan Note (Signed)
Does not use CPAP consistently.  On 3L at home for chronic respiratory failure.  Suspect OHS component as well. . - CPAP nightly

## 2023-07-23 NOTE — Assessment & Plan Note (Signed)
S/p internal jugular tunneled cath 8/20, successful 3 L dialyzed 8/21.  T/Th/Sat HD schedule. - HD today  - f/u AM RFP and Mag per Nephro

## 2023-07-23 NOTE — Discharge Summary (Signed)
Family Medicine Teaching [facility] Discharge Summary  Patient name: Jeffrey [name] Medical record number: [id number] Date of birth: 03/13/1961 Age: 62 y.o. Gender: male Date of Admission: 07/17/2023  Date of Discharge: 07/23/23  Admitting Physician: Lockie Mola, MD  Primary Care Provider: Knox Royalty, MD Consultants: Nephrology   Indication for Hospitalization: Acute LUE Pain  Brief Hospital Course:  Jeffrey [name] is a 62 y.o.male with a history of ESRD on dialysis, T2DM, PVD, CHF, HTN, chronic hypoxic respiratory failure, PTSD who was admitted to the [facility] Medicine Teaching Service at [facility] for left upper extremity pain. His hospital course is detailed below:  LUE Pain Presented with left arm pain after his dialysis appointment. States it radiated from his fingertips to his shoulder with pt unable to move his arm due to the pain.  Very TTP on exam. Pt had a CT angiogram of his LUE that was non-diagnostic with potential narrowing of the venous anastomosis of the otherwise patent left upper arm AV dialysis graft and a minimal amount of perigraft stranding without sizable hematoma or definable/drainable fluid collection.  Pain became more severe and required 1 mg Dilaudid Q3h.  Pt had TDC placement 8/20 by vascular surgery and avoided use of LUE fistula to allow it to rest.  No concern for steal syndrome or compartment syndrome and pain gradually improved, thought to be due to infiltration or injury of LUE nerve during dialysis.  Patient decreased to oxycodone 5 mg Q4H PRN with scheduled Tylenol and pain remained well controlled.  Discharged on above pain regimen.  ESRD on dialysis T/Th/Sat patient, but was unable to use L arm fistula due to pain.  TDC was placed on 8/20 and had dialysis afterwards with poor flow.  Repeat HD 8/21 dialyzed 3 L successfully.  Patient continued on T/Th/Sat schedule at discharge.  R eye blurred vision Patient had new onset vision blurriness and field  deficit with associated pain in R eye.  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  Ophtho saw patient and signed off with outpatient follow up, suspected vision changes related to hyperglycemia.  Remained stable prior to discharge.   T2DM  Pt was given 20U long acting and sensitive sliding scale in the hospital, but after being started on prednisone had multiple CBGs in 400-600 range.  Insulin was titrated up to resistant sliding scale, 45U BID LA, and 10U AC coverage.  CBGs improved by discharge and patient sent home on home insulin regimen.  OSA Patient not using CPAP at home consistently, maybe twice a month.  Became somewhat somnolent on 8/21 with sharply elevated glucose after HD, with VBG indicating hypercarbia and pH of 7.28.  Patient briefly placed on BiPAP and improved.  Then prescribed CPAP nightly without further episodes of somnolence.  Did continually require home 3 L of oxygen for chronic hypoxic respiratory failure.  Other chronic conditions were medically managed with home medications and formulary alternatives as necessary (PVD, CHF, HTN, PTSD, chronic back and phantom limb pain, chronic hypoxic resp failure).  PCP Follow-up Recommendations: Ophtho follow up for diabetic eye exam, possible cataracts Recommend gradually weaning Valium in favor of other psychiatric medications given adverse effects of BZAs  Discharge Diagnoses/Problem List:  Principal Problem:   Left arm pain Active Problems:   ESRD on dialysis (HCC)   Type 2 diabetes mellitus with hyperlipidemia (HCC)   OSA (obstructive sleep apnea)   Anemia due to chronic kidney disease, on chronic  dialysis (HCC)   Hyperuricemia   Visual field defect of right eye    Disposition: home  Discharge Condition: stable  Discharge Exam:  General: well appearing, no acute distress CV: regular rate, regular rhythm, no murmurs on exam  Pulm: clear, no wheezing, no  increased work of breathing  Abd: soft, non-tender, non-distended  Skin: warm, dry Ext: moves all four spontaneously, good tone   Significant Procedures: none   Significant Labs and Imaging:  Recent Labs  Lab 07/22/23 1307  WBC 6.7  HGB 10.5*  HCT 33.2*  PLT 135*   Recent Labs  Lab 07/22/23 1307  NA 132*  K 4.0  CL 95*  CO2 24  GLUCOSE 187*  BUN 47*  CREATININE 4.26*  CALCIUM 8.5*  PHOS 3.4  ALBUMIN 3.4*   DG left forearm:  IMPRESSION: 1. No acute radiographic abnormality of the left forearm to account for the patient's symptoms  DG Left hand:  IMPRESSION: 1. No acute radiographic abnormality of the left hand. 2. Degenerative changes of osteoarthritis, as above.  CTA LUE:  IMPRESSION: 1. Degraded examination without definitive explanation for patient's acute onset of left upper extremity pain. Specifically, the forearm vasculature is suboptimally evaluated though both the radial and ulnar arteries appear patent at the level of the mid and distal aspects of the left forearm. 2. Potential narrowing of the venous anastomosis of the otherwise patent left upper arm AV dialysis graft. Further evaluation with shuntogram could be performed as indicated.  CXR:  IMPRESSION: 1. Interval placement of dual-lumen left internal jugular approach central venous catheter with tip overlying the superior right atrium. 2. Moderate bilateral interstitial thickening appears slightly increased from 05/31/2023 and 04/22/2023, although a portion of this may be due to low lung volumes. This may represent mild interstitial pulmonary edema.  MRI Brain:  IMPRESSION: Normal brain MRI.  Results/Tests Pending at Time of Discharge: none   Discharge Medications:  Allergies as of 07/23/2023       Reactions   Actos [pioglitazone] Anaphylaxis, Rash   Dexmedetomidine Nausea And Vomiting, Other (See Comments)   (Precedex) Dose-limiting bradycardia   Ibuprofen Shortness Of Breath,  Swelling, Other (See Comments)   Pt tolerates aspirin   Tomato Anaphylaxis, Other (See Comments)   Only allergic to RAW tomatoes   Wellbutrin [bupropion] Swelling        Medication List     STOP taking these medications    metolazone 10 MG tablet Commonly known as: ZAROXOLYN       TAKE these medications    acetaminophen 325 MG tablet Commonly known as: TYLENOL Take 2 tablets (650 mg total) by mouth every 6 (six) hours.   amLODipine 10 MG tablet Commonly known as: NORVASC Take 10 mg by mouth every morning.   Aspirin Low Dose 81 MG tablet Generic drug: aspirin EC Take 81 mg by mouth in the morning.   atorvastatin 40 MG tablet Commonly known as: LIPITOR Take 40 mg by mouth in the morning.   calcium acetate 667 MG capsule Commonly known as: PHOSLO Take 2 capsules (1,334 mg total) by mouth with breakfast, with lunch, and with evening meal.   calcium carbonate (dosed in mg elemental calcium) 1250 MG/5ML Susp Take 5 mLs (500 mg of elemental calcium total) by mouth every 6 (six) hours as needed for indigestion.   carvedilol 12.5 MG tablet Commonly known as: COREG Take 12.5 mg by mouth 2 (two) times daily with a meal.   clopidogrel 75 MG tablet Commonly known as:  PLAVIX Take 1 tablet (75 mg total) by mouth daily.   cyclobenzaprine 10 MG tablet Commonly known as: FLEXERIL Take 10 mg by mouth 3 (three) times daily as needed for muscle spasms.   diazepam 5 MG tablet Commonly known as: VALIUM Take 5 mg by mouth 2 (two) times daily as needed for muscle spasms.   HumuLIN R U-500 KwikPen 500 UNIT/ML KwikPen Generic drug: insulin regular human CONCENTRATED Inject 45 Units into the skin daily with supper. What changed:  how much to take when to take this   nortriptyline 10 MG capsule Commonly known as: PAMELOR Take 20 mg by mouth at bedtime.   oxyCODONE 5 MG immediate release tablet Commonly known as: Oxy IR/ROXICODONE Take 1 tablet (5 mg total) by mouth every  4 (four) hours as needed for severe pain or moderate pain (please give patient scheduled tylenol BEFORE oxycodone).   pantoprazole 40 MG tablet Commonly known as: PROTONIX Take 40 mg by mouth daily before breakfast.   pregabalin 75 MG capsule Commonly known as: LYRICA Take 1 capsule (75 mg total) by mouth See admin instructions. Daily.  Give after dialysis on dialysis days.   sertraline 100 MG tablet Commonly known as: ZOLOFT Take 100 mg by mouth in the morning.   tamsulosin 0.4 MG Caps capsule Commonly known as: FLOMAX Take 1 capsule (0.4 mg total) by mouth daily.   torsemide 20 MG tablet Commonly known as: DEMADEX Take 120 mg by mouth 2 (two) times daily.   traZODone 150 MG tablet Commonly known as: DESYREL Take 150 mg by mouth at bedtime.   Vitamin D 50 MCG (2000 UT) Caps Take 2,000 Units by mouth daily.        Discharge Instructions: Please refer to Patient Instructions section of EMR for full details.  Patient was counseled important signs and symptoms that should prompt return to medical care, changes in medications, dietary instructions, activity restrictions, and follow up appointments.   Follow-Up Appointments:  Follow-up Information     Knox Royalty, MD Follow up.   Specialty: Family Medicine Contact information: [address] [place] [address] [phone]         Haynes Hoehn, MD Follow up.   Specialty: Ophthalmology Why: Please follow up with ophthalmology in 1-2 weeks. Contact information: [address] [place] [address] [phone]                 Lockie Mola, MD 07/23/2023, 3:36 PM PGY-2, [facility]

## 2023-07-23 NOTE — Discharge Instructions (Signed)
Dear Jeffrey [name],  Thank you for letting us participate in your care. You were hospitalized for Left arm pain. You underwent several tests and imaging which did not show anything significant, which is good news. You will be discharged home with scheduled tylenol and oxycodone 5 mg as needed every 4 hours to help with your pain.   POST-HOSPITAL & CARE INSTRUCTIONS You will need to follow up with ophthalmology for your blurry vision.  Go to your follow up appointments (listed below)   DOCTOR'S APPOINTMENT   No future appointments.  Follow-up Information     Knox Royalty, MD Follow up.   Specialty: Family Medicine Contact information: [address] [place] [address] [phone]         Haynes Hoehn, MD Follow up.   Specialty: Ophthalmology Why: Please follow up with ophthalmology in 1-2 weeks. Contact information: [address] [place] [address] [phone]                 Take care and be well!  Family Medicine Teaching Service Inpatient Team [place]  [facility]  [address] [place], [place] [address] [phone]

## 2023-07-25 ENCOUNTER — Other Ambulatory Visit: Payer: Self-pay

## 2023-07-25 ENCOUNTER — Emergency Department (HOSPITAL_COMMUNITY)
Admission: EM | Admit: 2023-07-25 | Discharge: 2023-07-25 | Disposition: A | Discharge status: Home / Self Care | Payer: 59 | Attending: Emergency Medicine | Admitting: Emergency Medicine

## 2023-07-25 DIAGNOSIS — Z79899 Other long term (current) drug therapy: Secondary | ICD-10-CM | POA: Insufficient documentation

## 2023-07-25 DIAGNOSIS — I509 Heart failure, unspecified: Secondary | ICD-10-CM | POA: Diagnosis not present

## 2023-07-25 DIAGNOSIS — I132 Hypertensive heart and chronic kidney disease with heart failure and with stage 5 chronic kidney disease, or end stage renal disease: Secondary | ICD-10-CM | POA: Insufficient documentation

## 2023-07-25 DIAGNOSIS — T82598A Other mechanical complication of other cardiac and vascular devices and implants, initial encounter: Secondary | ICD-10-CM | POA: Diagnosis not present

## 2023-07-25 DIAGNOSIS — Y732 Prosthetic and other implants, materials and accessory gastroenterology and urology devices associated with adverse incidents: Secondary | ICD-10-CM | POA: Insufficient documentation

## 2023-07-25 DIAGNOSIS — Z992 Dependence on renal dialysis: Secondary | ICD-10-CM | POA: Diagnosis not present

## 2023-07-25 DIAGNOSIS — Z7982 Long term (current) use of aspirin: Secondary | ICD-10-CM | POA: Diagnosis not present

## 2023-07-25 DIAGNOSIS — E1122 Type 2 diabetes mellitus with diabetic chronic kidney disease: Secondary | ICD-10-CM | POA: Insufficient documentation

## 2023-07-25 DIAGNOSIS — M79602 Pain in left arm: Secondary | ICD-10-CM | POA: Insufficient documentation

## 2023-07-25 DIAGNOSIS — Z794 Long term (current) use of insulin: Secondary | ICD-10-CM | POA: Diagnosis not present

## 2023-07-25 DIAGNOSIS — N186 End stage renal disease: Secondary | ICD-10-CM | POA: Insufficient documentation

## 2023-07-25 LAB — RENAL FUNCTION PANEL
Albumin: 3.6 g/dL (ref 3.5–5.0)
Anion gap: 14 (ref 5–15)
BUN: 116 mg/dL — ABNORMAL HIGH (ref 8–23)
CO2: 26 mmol/L (ref 22–32)
Calcium: 8.4 mg/dL — ABNORMAL LOW (ref 8.9–10.3)
Chloride: 89 mmol/L — ABNORMAL LOW (ref 98–111)
Creatinine, Ser: 6.33 mg/dL — ABNORMAL HIGH (ref 0.61–1.24)
GFR, Estimated: 9 mL/min — ABNORMAL LOW (ref >60)
Glucose, Bld: 139 mg/dL — ABNORMAL HIGH (ref 70–99)
Phosphorus: 6.5 mg/dL — ABNORMAL HIGH (ref 2.5–4.6)
Potassium: 4 mmol/L (ref 3.5–5.1)
Sodium: 129 mmol/L — ABNORMAL LOW (ref 135–145)

## 2023-07-25 LAB — CBC
HCT: 33 % — ABNORMAL LOW (ref 39.0–52.0)
Hemoglobin: 10.2 g/dL — ABNORMAL LOW (ref 13.0–17.0)
MCH: 26.8 pg (ref 26.0–34.0)
MCHC: 30.9 g/dL (ref 30.0–36.0)
MCV: 86.8 fL (ref 80.0–100.0)
Platelets: 175 10*3/uL (ref 150–400)
RBC: 3.8 MIL/uL — ABNORMAL LOW (ref 4.22–5.81)
RDW: 16 % — ABNORMAL HIGH (ref 11.5–15.5)
WBC: 8.6 10*3/uL (ref 4.0–10.5)
nRBC: 0 % (ref 0.0–0.2)

## 2023-07-25 MED ORDER — CHLORHEXIDINE GLUCONATE CLOTH 2 % EX PADS: 6.0000 | MEDICATED_PAD | Freq: Every day | CUTANEOUS | Status: DC

## 2023-07-25 MED ORDER — ALTEPLASE 2 MG IJ SOLR: 2.0000 mg | Freq: Once | INTRAMUSCULAR | Status: DC | PRN

## 2023-07-25 MED ORDER — LIDOCAINE-PRILOCAINE 2.5-2.5 % EX CREA: 1.0000 | TOPICAL_CREAM | CUTANEOUS | Status: DC | PRN

## 2023-07-25 MED ORDER — OXYCODONE HCL 5 MG PO TABS
5.0000 mg | ORAL_TABLET | Freq: Once | ORAL | Status: AC
  Administered 2023-07-25: 5 mg via ORAL
  Filled 2023-07-25: qty 1

## 2023-07-25 MED ORDER — ANTICOAGULANT SODIUM CITRATE 4% (200MG/5ML) IV SOLN
5.0000 mL | Status: DC | PRN
  Filled 2023-07-25: qty 5

## 2023-07-25 MED ORDER — HEPARIN SODIUM (PORCINE) 1000 UNIT/ML IJ SOLN
4200.0000 [IU] | Freq: Once | INTRAMUSCULAR | Status: AC
  Administered 2023-07-25: 4200 [IU]

## 2023-07-25 MED ORDER — HEPARIN SODIUM (PORCINE) 1000 UNIT/ML DIALYSIS: 1000.0000 [IU] | INTRAMUSCULAR | Status: DC | PRN

## 2023-07-25 MED ORDER — HEPARIN SODIUM (PORCINE) 1000 UNIT/ML DIALYSIS: 20.0000 [IU]/kg | INTRAMUSCULAR | Status: DC | PRN

## 2023-07-25 MED ORDER — LIDOCAINE HCL (PF) 1 % IJ SOLN: 5.0000 mL | INTRAMUSCULAR | Status: DC | PRN

## 2023-07-25 MED ORDER — PENTAFLUOROPROP-TETRAFLUOROETH EX AERO: 1.0000 | INHALATION_SPRAY | CUTANEOUS | Status: DC | PRN

## 2023-07-25 MED ORDER — HEPARIN SODIUM (PORCINE) 1000 UNIT/ML IJ SOLN
2000.0000 [IU] | Freq: Once | INTRAMUSCULAR | Status: AC
  Administered 2023-07-25: 2000 [IU] via INTRAVENOUS
  Filled 2023-07-25: qty 2

## 2023-07-25 NOTE — ED Triage Notes (Signed)
Pt. Here for dialysis . Here las Saturday completed full treatment. Pt. Stated, continues to have left arm pain

## 2023-07-25 NOTE — Progress Notes (Signed)
Received patient in bed to unit.  Alert and oriented.  Informed consent signed and in chart.   TX duration: 1hours:  Patient terminated early due to ride being here, AMA paperwork signed, Ozzie Hoyle PA informed.  Patient tolerated well.  Transported back to the room  Alert, without acute distress.  Hand-off given to patient's nurse.   Access used: Left HD Cath Access issues: none  Total UF removed: Medication(s) given: pain meds, see MAR   07/25/23 1549  Vitals  Temp (!) 97.5 F (36.4 C)  Temp Source Oral  BP (!) 150/85  MAP (mmHg) 105  Pulse Rate 81  ECG Heart Rate 80  Resp 11  MEWS COLOR  MEWS Score Color Green  Oxygen Therapy  SpO2 100 %  O2 Device Nasal Cannula  O2 Flow Rate (L/min) 3 L/min  MEWS Score  MEWS Temp 0  MEWS Systolic 0  MEWS Pulse 0  MEWS RR 1  MEWS LOC 0  MEWS Score 1     Stacie Glaze LPN [facility]

## 2023-07-25 NOTE — ED Provider Notes (Signed)
[place] EMERGENCY DEPARTMENT AT [facility] Provider Note   CSN: [id number] Arrival date & time: 07/25/23  1914     History  Chief Complaint  Patient presents with   Vascular Access Problem   left arm pain    Jeffrey [name] is a 62 y.o. male.  HPI Patient presents for hemodialysis.  Medical history includes ESRD on dialysis, T2DM, PVD, CHF, HTN, chronic hypoxic respiratory failure, PTSD.  He was recently admitted for left upper arm pain.  While admitted, he maintained his Tuesday, Thursday, Saturday schedule.  Last dialysis was on Saturday.  He is now getting dialysis through a left chest tunneled catheter.  He states that his left upper arm pain has continued to gradually improve, but is still sore.  He denies any other new recent symptoms.    Home Medications Prior to Admission medications   Medication Sig Start Date End Date Taking? Authorizing Provider  acetaminophen (TYLENOL) 325 MG tablet Take 2 tablets (650 mg total) by mouth every 6 (six) hours. 07/23/23   Lockie Mola, MD  amLODipine (NORVASC) 10 MG tablet Take 10 mg by mouth every morning. 04/17/20   [provider]  ASPIRIN LOW DOSE 81 MG EC tablet Take 81 mg by mouth in the morning. 02/22/21   [provider]  atorvastatin (LIPITOR) 40 MG tablet Take 40 mg by mouth in the morning. 02/09/21   [provider]  calcium acetate (PHOSLO) 667 MG capsule Take 2 capsules (1,334 mg total) by mouth with breakfast, with lunch, and with evening meal. 04/10/21   Calvert Cantor, MD  Calcium Carbonate Antacid (CALCIUM CARBONATE, DOSED IN MG ELEMENTAL CALCIUM,) 1250 MG/5ML SUSP Take 5 mLs (500 mg of elemental calcium total) by mouth every 6 (six) hours as needed for indigestion. 04/08/23   Rhetta Mura, MD  carvedilol (COREG) 12.5 MG tablet Take 12.5 mg by mouth 2 (two) times daily with a meal. 04/17/20   [provider]  Cholecalciferol (VITAMIN D) 50 MCG (2000 UT) CAPS Take 2,000 Units  by mouth daily.    [provider]  clopidogrel (PLAVIX) 75 MG tablet Take 1 tablet (75 mg total) by mouth daily. 11/02/22   Rhyne, Ames Coupe, PA-C  cyclobenzaprine (FLEXERIL) 10 MG tablet Take 10 mg by mouth 3 (three) times daily as needed for muscle spasms. 12/29/21   [provider]  diazepam (VALIUM) 5 MG tablet Take 5 mg by mouth 2 (two) times daily as needed for muscle spasms. 12/09/21   [provider]  insulin regular human CONCENTRATED (HUMULIN R U-500 KWIKPEN) 500 UNIT/ML KwikPen Inject 45 Units into the skin daily with supper. Patient taking differently: Inject 70-125 Units into the skin 2 (two) times daily with a meal. 09/04/21 08/30/24  [name], MD  nortriptyline (PAMELOR) 10 MG capsule Take 20 mg by mouth at bedtime.    [provider]  oxyCODONE (OXY IR/ROXICODONE) 5 MG immediate release tablet Take 1 tablet (5 mg total) by mouth every 4 (four) hours as needed for severe pain or moderate pain (please give patient scheduled tylenol BEFORE oxycodone). 07/23/23   Lockie Mola, MD  pantoprazole (PROTONIX) 40 MG tablet Take 40 mg by mouth daily before breakfast. 02/09/21   [provider]  pregabalin (LYRICA) 75 MG capsule Take 1 capsule (75 mg total) by mouth See admin instructions. Daily.  Give after dialysis on dialysis days. 01/21/23   [name], MD  sertraline (ZOLOFT) 100 MG tablet Take 100 mg by mouth  in the morning. 02/22/21   [provider]  tamsulosin (FLOMAX) 0.4 MG CAPS capsule Take 1 capsule (0.4 mg total) by mouth daily. 11/10/21   [name], MD  torsemide (DEMADEX) 20 MG tablet Take 120 mg by mouth 2 (two) times daily.    [provider]  traZODone (DESYREL) 150 MG tablet Take 150 mg by mouth at bedtime. 12/09/21   [provider]      Allergies    Actos [pioglitazone], Dexmedetomidine, Ibuprofen, Tomato, and Wellbutrin [bupropion]    Review of Systems   Review of Systems  Musculoskeletal:   Positive for myalgias.  All other systems reviewed and are negative.   Physical Exam Updated Vital Signs BP (!) 156/77 (BP Location: Right Wrist)   Pulse 79   Temp (!) 97.5 F (36.4 C) (Oral)   Resp 11   SpO2 100%  Physical Exam Vitals and nursing note reviewed.  Constitutional:      General: He is not in acute distress.    Appearance: Normal appearance. He is well-developed. He is ill-appearing (Chronically). He is not toxic-appearing or diaphoretic.  HENT:     Head: Normocephalic and atraumatic.     Right Ear: External ear normal.     Left Ear: External ear normal.     Nose: Nose normal.     Mouth/Throat:     Mouth: Mucous membranes are moist.  Eyes:     Extraocular Movements: Extraocular movements intact.     Conjunctiva/sclera: Conjunctivae normal.  Cardiovascular:     Rate and Rhythm: Normal rate and regular rhythm.  Pulmonary:     Effort: Pulmonary effort is normal. No respiratory distress.  Abdominal:     Palpations: Abdomen is soft.  Musculoskeletal:        General: No deformity.     Cervical back: Normal range of motion and neck supple.  Skin:    General: Skin is warm and dry.     Coloration: Skin is not jaundiced or pale.  Neurological:     General: No focal deficit present.     Mental Status: He is alert and oriented to person, place, and time.  Psychiatric:        Mood and Affect: Mood normal.        Behavior: Behavior normal.     ED Results / Procedures / Treatments   Labs (all labs ordered are listed, but only abnormal results are displayed) Labs Reviewed  CBC - Abnormal; Notable for the following components:      Result Value   RBC 3.80 (*)    Hemoglobin 10.2 (*)    HCT 33.0 (*)    RDW 16.0 (*)    All other components within normal limits  RENAL FUNCTION PANEL - Abnormal; Notable for the following components:   Sodium 129 (*)    Chloride 89 (*)    Glucose, Bld 139 (*)    BUN 116 (*)    Creatinine, Ser 6.33 (*)    Calcium 8.4 (*)     Phosphorus 6.5 (*)    GFR, Estimated 9 (*)    All other components within normal limits  HEPATITIS B SURFACE ANTIBODY, QUANTITATIVE  HEPATITIS B SURFACE ANTIGEN    EKG None  Radiology No results found.  Procedures Procedures    Medications Ordered in ED Medications  Chlorhexidine Gluconate Cloth 2 % PADS 6 each (has no administration in time range)  heparin sodium (porcine) injection 2,000 Units (2,000 Units Intravenous Given 07/25/23 1431)  oxyCODONE (  Oxy IR/ROXICODONE) immediate release tablet 5 mg (5 mg Oral Given 07/25/23 1446)  heparin sodium (porcine) injection 4,200 Units (4,200 Units Intracatheter Given 07/25/23 1543)    ED Course/ Medical Decision Making/ A&P                                 Medical Decision Making Risk Prescription drug management.   Patient presenting for routine hemodialysis.  Currently, he does obtain dialysis through the ED.  His last dialysis session was on Saturday, 3 days ago.  He has no new complaints.  I spoke with nephrologist on-call, Dr. Malen Gauze, who will arrange for hemodialysis.  On reassessment, patient sitting comfortably in motorized wheelchair in hallway, listening to music.  He did have some worsening of his left arm pain.  He was given a dose of oxycodone.  He underwent hemodialysis.  On his return to the ED, patient left prior to assessment.        Final Clinical Impression(s) / ED Diagnoses Final diagnoses:  ESRD (end stage renal disease) on dialysis [facility])    Rx / DC Orders ED Discharge Orders     None         Gloris Manchester, MD 07/25/23 1627

## 2023-07-27 ENCOUNTER — Other Ambulatory Visit: Payer: Self-pay

## 2023-07-27 ENCOUNTER — Encounter (HOSPITAL_COMMUNITY): Payer: Self-pay

## 2023-07-27 ENCOUNTER — Emergency Department (HOSPITAL_COMMUNITY)
Admission: EM | Admit: 2023-07-27 | Discharge: 2023-07-27 | Disposition: A | Discharge status: Home / Self Care | Payer: 59 | Source: Home / Self Care | Attending: Emergency Medicine | Admitting: Emergency Medicine

## 2023-07-27 DIAGNOSIS — Z992 Dependence on renal dialysis: Secondary | ICD-10-CM | POA: Diagnosis not present

## 2023-07-27 DIAGNOSIS — N186 End stage renal disease: Secondary | ICD-10-CM | POA: Diagnosis present

## 2023-07-27 DIAGNOSIS — Z794 Long term (current) use of insulin: Secondary | ICD-10-CM | POA: Diagnosis not present

## 2023-07-27 LAB — RENAL FUNCTION PANEL
Albumin: 3.6 g/dL (ref 3.5–5.0)
Anion gap: 15 (ref 5–15)
BUN: 126 mg/dL — ABNORMAL HIGH (ref 8–23)
CO2: 24 mmol/L (ref 22–32)
Calcium: 7.5 mg/dL — ABNORMAL LOW (ref 8.9–10.3)
Chloride: 92 mmol/L — ABNORMAL LOW (ref 98–111)
Creatinine, Ser: 6.12 mg/dL — ABNORMAL HIGH (ref 0.61–1.24)
GFR, Estimated: 10 mL/min — ABNORMAL LOW (ref >60)
Glucose, Bld: 221 mg/dL — ABNORMAL HIGH (ref 70–99)
Phosphorus: 7.9 mg/dL — ABNORMAL HIGH (ref 2.5–4.6)
Potassium: 4.5 mmol/L (ref 3.5–5.1)
Sodium: 131 mmol/L — ABNORMAL LOW (ref 135–145)

## 2023-07-27 LAB — CBC
HCT: 31.6 % — ABNORMAL LOW (ref 39.0–52.0)
Hemoglobin: 9.9 g/dL — ABNORMAL LOW (ref 13.0–17.0)
MCH: 26.8 pg (ref 26.0–34.0)
MCHC: 31.3 g/dL (ref 30.0–36.0)
MCV: 85.4 fL (ref 80.0–100.0)
Platelets: 176 10*3/uL (ref 150–400)
RBC: 3.7 MIL/uL — ABNORMAL LOW (ref 4.22–5.81)
RDW: 16.3 % — ABNORMAL HIGH (ref 11.5–15.5)
WBC: 7.8 10*3/uL (ref 4.0–10.5)
nRBC: 0 % (ref 0.0–0.2)

## 2023-07-27 MED ORDER — HEPARIN SODIUM (PORCINE) 1000 UNIT/ML DIALYSIS: 20.0000 [IU]/kg | INTRAMUSCULAR | Status: DC | PRN

## 2023-07-27 MED ORDER — PENTAFLUOROPROP-TETRAFLUOROETH EX AERO: 1.0000 | INHALATION_SPRAY | CUTANEOUS | Status: DC | PRN

## 2023-07-27 MED ORDER — LIDOCAINE HCL (PF) 1 % IJ SOLN: 5.0000 mL | INTRAMUSCULAR | Status: DC | PRN

## 2023-07-27 MED ORDER — ALTEPLASE 2 MG IJ SOLR: 2.0000 mg | Freq: Once | INTRAMUSCULAR | Status: DC | PRN

## 2023-07-27 MED ORDER — CHLORHEXIDINE GLUCONATE CLOTH 2 % EX PADS: 6.0000 | MEDICATED_PAD | Freq: Every day | CUTANEOUS | Status: DC

## 2023-07-27 MED ORDER — HEPARIN SODIUM (PORCINE) 1000 UNIT/ML IJ SOLN
4200.0000 [IU] | Freq: Once | INTRAMUSCULAR | Status: AC
  Administered 2023-07-27: 4200 [IU] via INTRAVENOUS
  Filled 2023-07-27: qty 5

## 2023-07-27 MED ORDER — HEPARIN SODIUM (PORCINE) 1000 UNIT/ML DIALYSIS: 1000.0000 [IU] | INTRAMUSCULAR | Status: DC | PRN

## 2023-07-27 MED ORDER — LIDOCAINE-PRILOCAINE 2.5-2.5 % EX CREA: 1.0000 | TOPICAL_CREAM | CUTANEOUS | Status: DC | PRN

## 2023-07-27 MED ORDER — ANTICOAGULANT SODIUM CITRATE 4% (200MG/5ML) IV SOLN: 5.0000 mL | Status: DC | PRN

## 2023-07-27 MED ORDER — HEPARIN SODIUM (PORCINE) 1000 UNIT/ML IJ SOLN
2000.0000 [IU] | Freq: Once | INTRAMUSCULAR | Status: AC
  Administered 2023-07-27: 2000 [IU] via INTRAVENOUS
  Filled 2023-07-27: qty 2

## 2023-07-27 MED ORDER — ACETAMINOPHEN 325 MG PO TABS
650.0000 mg | ORAL_TABLET | Freq: Once | ORAL | Status: AC
  Administered 2023-07-27: 650 mg via ORAL
  Filled 2023-07-27: qty 2

## 2023-07-27 NOTE — Progress Notes (Signed)
Received patient in bed.Awake,alert and oriented x 4. He signed his treatment consent.  Access used ; Left HD catheter,arterial limb has a pull problem,treatment done on reverse connection. Dressing changed done today.  Medicines given" Tylenol 650 mg.                              Heparin 2,000 units pre run dose.  Duration of treatment : 3.5 hours.  Fluid removed :Met goal of 1.5 liters.  Hemo comment: Tolerated treatment.  Hand off to the patient's nurse.

## 2023-07-27 NOTE — ED Provider Notes (Signed)
[place] EMERGENCY DEPARTMENT AT [facility] Provider Note   CSN: [id number] Arrival date & time: 07/27/23  4098     History  Chief Complaint  Patient presents with   NEEDS DIALYSIS    CALCIFER [name] is a 62 y.o. male.  62 year old male with prior medical history as detailed below presents for evaluation.  Patient with known history of ESRD on HD.  Patient has been getting his HD through the ED for the last several weeks.  Patient is presenting today requesting HD.  He is without other complaint.  Dr. Malen Gauze with nephrology is aware of patient's presence here in the ED and plans on having patient being dialyzed today.  The history is provided by the patient and medical records.       Home Medications Prior to Admission medications   Medication Sig Start Date End Date Taking? Authorizing Provider  acetaminophen (TYLENOL) 325 MG tablet Take 2 tablets (650 mg total) by mouth every 6 (six) hours. 07/23/23   Lockie Mola, MD  amLODipine (NORVASC) 10 MG tablet Take 10 mg by mouth every morning. 04/17/20   [provider]  ASPIRIN LOW DOSE 81 MG EC tablet Take 81 mg by mouth in the morning. 02/22/21   [provider]  atorvastatin (LIPITOR) 40 MG tablet Take 40 mg by mouth in the morning. 02/09/21   [provider]  calcium acetate (PHOSLO) 667 MG capsule Take 2 capsules (1,334 mg total) by mouth with breakfast, with lunch, and with evening meal. 04/10/21   Calvert Cantor, MD  Calcium Carbonate Antacid (CALCIUM CARBONATE, DOSED IN MG ELEMENTAL CALCIUM,) 1250 MG/5ML SUSP Take 5 mLs (500 mg of elemental calcium total) by mouth every 6 (six) hours as needed for indigestion. 04/08/23   Rhetta Mura, MD  carvedilol (COREG) 12.5 MG tablet Take 12.5 mg by mouth 2 (two) times daily with a meal. 04/17/20   [provider]  Cholecalciferol (VITAMIN D) 50 MCG (2000 UT) CAPS Take 2,000 Units by mouth daily.    [provider]  clopidogrel  (PLAVIX) 75 MG tablet Take 1 tablet (75 mg total) by mouth daily. 11/02/22   Rhyne, Ames Coupe, PA-C  cyclobenzaprine (FLEXERIL) 10 MG tablet Take 10 mg by mouth 3 (three) times daily as needed for muscle spasms. 12/29/21   [provider]  diazepam (VALIUM) 5 MG tablet Take 5 mg by mouth 2 (two) times daily as needed for muscle spasms. 12/09/21   [provider]  insulin regular human CONCENTRATED (HUMULIN R U-500 KWIKPEN) 500 UNIT/ML KwikPen Inject 45 Units into the skin daily with supper. Patient taking differently: Inject 70-125 Units into the skin 2 (two) times daily with a meal. 09/04/21 08/30/24  [name], MD  nortriptyline (PAMELOR) 10 MG capsule Take 20 mg by mouth at bedtime.    [provider]  oxyCODONE (OXY IR/ROXICODONE) 5 MG immediate release tablet Take 1 tablet (5 mg total) by mouth every 4 (four) hours as needed for severe pain or moderate pain (please give patient scheduled tylenol BEFORE oxycodone). 07/23/23   Lockie Mola, MD  pantoprazole (PROTONIX) 40 MG tablet Take 40 mg by mouth daily before breakfast. 02/09/21   [provider]  pregabalin (LYRICA) 75 MG capsule Take 1 capsule (75 mg total) by mouth See admin instructions. Daily.  Give after dialysis on dialysis days. 01/21/23   [name], MD  sertraline (ZOLOFT) 100 MG tablet Take 100 mg by mouth in the morning. 02/22/21  [provider]  tamsulosin (FLOMAX) 0.4 MG CAPS capsule Take 1 capsule (0.4 mg total) by mouth daily. 11/10/21   [name], MD  torsemide (DEMADEX) 20 MG tablet Take 120 mg by mouth 2 (two) times daily.    [provider]  traZODone (DESYREL) 150 MG tablet Take 150 mg by mouth at bedtime. 12/09/21   [provider]      Allergies    Actos [pioglitazone], Dexmedetomidine, Ibuprofen, Tomato, and Wellbutrin [bupropion]    Review of Systems   Review of Systems  All other systems reviewed and are negative.   Physical Exam Updated  Vital Signs BP (!) 156/59 (BP Location: Right Wrist)   Pulse 83   Temp 98 F (36.7 C) (Oral)   Resp 17   Ht 5\' 5"  (1.651 m)   Wt 129.7 kg   SpO2 100%   BMI 47.59 kg/m  Physical Exam Vitals and nursing note reviewed.  Constitutional:      General: He is not in acute distress.    Appearance: Normal appearance. He is well-developed.  HENT:     Head: Normocephalic and atraumatic.  Eyes:     Conjunctiva/sclera: Conjunctivae normal.     Pupils: Pupils are equal, round, and reactive to light.  Cardiovascular:     Rate and Rhythm: Normal rate and regular rhythm.     Heart sounds: Normal heart sounds.  Pulmonary:     Effort: Pulmonary effort is normal. No respiratory distress.     Breath sounds: Normal breath sounds.  Abdominal:     General: There is no distension.     Palpations: Abdomen is soft.     Tenderness: There is no abdominal tenderness.  Musculoskeletal:        General: No deformity. Normal range of motion.     Cervical back: Normal range of motion and neck supple.  Skin:    General: Skin is warm and dry.  Neurological:     General: No focal deficit present.     Mental Status: He is alert and oriented to person, place, and time.     ED Results / Procedures / Treatments   Labs (all labs ordered are listed, but only abnormal results are displayed) Labs Reviewed  RENAL FUNCTION PANEL  CBC    EKG None  Radiology No results found.  Procedures Procedures    Medications Ordered in ED Medications  Chlorhexidine Gluconate Cloth 2 % PADS 6 each (has no administration in time range)  pentafluoroprop-tetrafluoroeth (GEBAUERS) aerosol 1 Application (has no administration in time range)  lidocaine (PF) (XYLOCAINE) 1 % injection 5 mL (has no administration in time range)  lidocaine-prilocaine (EMLA) cream 1 Application (has no administration in time range)  heparin injection 1,000 Units (has no administration in time range)  anticoagulant sodium citrate solution 5  mL (has no administration in time range)  alteplase (CATHFLO ACTIVASE) injection 2 mg (has no administration in time range)  heparin injection 2,600 Units (has no administration in time range)  heparin sodium (porcine) injection 2,000 Units (has no administration in time range)    ED Course/ Medical Decision Making/ A&P                                 Medical Decision Making   Medical Screen Complete  This patient presented to the ED with complaint of ESRD, needs HD.  This complaint involves an extensive number of treatment options. The  initial differential diagnosis includes, but is not limited to, metabolic abnormality  This presentation is: Acute, Chronic, Self-Limited, Previously Undiagnosed, Uncertain Prognosis, Complicated, Systemic Symptoms, and Threat to Life/Bodily Function  Patient with known history of ESRD on HD.  Patient is presenting today for HD session through the ED.  Patient is otherwise without complaint.  Dr. Malen Gauze with nephrology is aware of case.  Patient to be dialyzed today.  Patient likely appropriate for discharge after dialysis session completed.  Additional history obtained: External records from outside sources obtained and reviewed including prior ED visits and prior Inpatient records.   Consultations Obtained:  I consulted Dr. Malen Gauze,  and discussed lab and imaging findings as well as pertinent plan of care.    Problem List / ED Course:  ESRD   Reevaluation:  After the interventions noted above, I reevaluated the patient and found that they have: stayed the same   Disposition:  After consideration of the diagnostic results and the patients response to treatment, I feel that the patent would benefit from completion ED dialysis.          Final Clinical Impression(s) / ED Diagnoses Final diagnoses:  ESRD (end stage renal disease) (HCC)    Rx / DC Orders ED Discharge Orders     None         Wynetta Fines,  MD 07/27/23 1539

## 2023-07-27 NOTE — Procedures (Signed)
I was present at this dialysis session. I have reviewed the session itself and made appropriate changes.   Filed Weights   07/27/23 0738  Weight: 129.7 kg    Recent Labs  Lab 07/27/23 1000  NA 131*  K 4.5  CL 92*  CO2 24  GLUCOSE 221*  BUN 126*  CREATININE 6.12*  CALCIUM 7.5*  PHOS 7.9*    Recent Labs  Lab 07/22/23 1307 07/25/23 1446 07/27/23 0942  WBC 6.7 8.6 7.8  HGB 10.5* 10.2* 9.9*  HCT 33.2* 33.0* 31.6*  MCV 83.8 86.8 85.4  PLT 135* 175 176    Scheduled Meds:  Chlorhexidine Gluconate Cloth  6 each Topical Q0600   Continuous Infusions:  anticoagulant sodium citrate     PRN Meds:.alteplase, anticoagulant sodium citrate, heparin, heparin, lidocaine (PF), lidocaine-prilocaine, pentafluoroprop-tetrafluoroeth   Anthony Sar, MD [facility] 07/27/2023, 2:57 PM

## 2023-07-27 NOTE — ED Triage Notes (Signed)
Patient here for his routine dialysis.  No complaints at this time and on his normal 3L [place]

## 2023-07-27 NOTE — Progress Notes (Signed)
Visited with Pt. Pt in good spirits. Here for routine treatment. Provided listening and emotional support.  Will follow as needed.  Venida Jarvis, [place], [facility], Pager [phone]

## 2023-07-29 ENCOUNTER — Emergency Department (HOSPITAL_COMMUNITY)
Admission: EM | Admit: 2023-07-29 | Discharge: 2023-07-29 | Disposition: A | Discharge status: Home / Self Care | Payer: 59 | Source: Home / Self Care | Attending: Emergency Medicine | Admitting: Emergency Medicine

## 2023-07-29 ENCOUNTER — Other Ambulatory Visit: Payer: Self-pay

## 2023-07-29 ENCOUNTER — Encounter (HOSPITAL_COMMUNITY): Payer: Self-pay | Admitting: *Deleted

## 2023-07-29 DIAGNOSIS — Z794 Long term (current) use of insulin: Secondary | ICD-10-CM | POA: Insufficient documentation

## 2023-07-29 DIAGNOSIS — Y732 Prosthetic and other implants, materials and accessory gastroenterology and urology devices associated with adverse incidents: Secondary | ICD-10-CM | POA: Diagnosis not present

## 2023-07-29 DIAGNOSIS — T82598A Other mechanical complication of other cardiac and vascular devices and implants, initial encounter: Secondary | ICD-10-CM | POA: Diagnosis present

## 2023-07-29 DIAGNOSIS — Z7902 Long term (current) use of antithrombotics/antiplatelets: Secondary | ICD-10-CM | POA: Diagnosis not present

## 2023-07-29 DIAGNOSIS — Z992 Dependence on renal dialysis: Secondary | ICD-10-CM | POA: Diagnosis not present

## 2023-07-29 DIAGNOSIS — Z7982 Long term (current) use of aspirin: Secondary | ICD-10-CM | POA: Insufficient documentation

## 2023-07-29 DIAGNOSIS — N186 End stage renal disease: Secondary | ICD-10-CM

## 2023-07-29 DIAGNOSIS — Z79899 Other long term (current) drug therapy: Secondary | ICD-10-CM | POA: Insufficient documentation

## 2023-07-29 LAB — RENAL FUNCTION PANEL
Albumin: 3.6 g/dL (ref 3.5–5.0)
Anion gap: 14 (ref 5–15)
BUN: 79 mg/dL — ABNORMAL HIGH (ref 8–23)
CO2: 26 mmol/L (ref 22–32)
Calcium: 7.3 mg/dL — ABNORMAL LOW (ref 8.9–10.3)
Chloride: 94 mmol/L — ABNORMAL LOW (ref 98–111)
Creatinine, Ser: 5.09 mg/dL — ABNORMAL HIGH (ref 0.61–1.24)
GFR, Estimated: 12 mL/min — ABNORMAL LOW (ref >60)
Glucose, Bld: 135 mg/dL — ABNORMAL HIGH (ref 70–99)
Phosphorus: 5.2 mg/dL — ABNORMAL HIGH (ref 2.5–4.6)
Potassium: 3.9 mmol/L (ref 3.5–5.1)
Sodium: 134 mmol/L — ABNORMAL LOW (ref 135–145)

## 2023-07-29 LAB — CBC
HCT: 32.3 % — ABNORMAL LOW (ref 39.0–52.0)
Hemoglobin: 9.8 g/dL — ABNORMAL LOW (ref 13.0–17.0)
MCH: 26.5 pg (ref 26.0–34.0)
MCHC: 30.3 g/dL (ref 30.0–36.0)
MCV: 87.3 fL (ref 80.0–100.0)
Platelets: 163 10*3/uL (ref 150–400)
RBC: 3.7 MIL/uL — ABNORMAL LOW (ref 4.22–5.81)
RDW: 16.4 % — ABNORMAL HIGH (ref 11.5–15.5)
WBC: 7.5 10*3/uL (ref 4.0–10.5)
nRBC: 0 % (ref 0.0–0.2)

## 2023-07-29 MED ORDER — CHLORHEXIDINE GLUCONATE CLOTH 2 % EX PADS: 6.0000 | MEDICATED_PAD | Freq: Every day | CUTANEOUS | Status: DC

## 2023-07-29 MED ORDER — HEPARIN SODIUM (PORCINE) 1000 UNIT/ML IJ SOLN
INTRAMUSCULAR | Status: AC
  Administered 2023-07-29: 1000 [IU]
  Filled 2023-07-29: qty 4

## 2023-07-29 NOTE — Discharge Instructions (Addendum)
Please continue your outpatient management plan

## 2023-07-29 NOTE — Progress Notes (Signed)
   07/29/23 1200  Vitals  Temp 98.2 F (36.8 C)  Temp Source Oral  BP (!) 140/70  BP Location Right Wrist  BP Method Automatic  Patient Position (if appropriate) Lying  Pulse Rate 80  Resp 14  During Treatment Monitoring  Intra-Hemodialysis Comments Tx completed  Post Treatment  Dialyzer Clearance Lightly streaked  Hemodialysis Intake (mL) 0 mL  Liters Processed 70  Fluid Removed (mL) 2060 mL  Tolerated HD Treatment Yes  Post-Hemodialysis Comments Pt goal met.  Hemodialysis Catheter Left Internal jugular Double lumen Permanent (Tunneled)  Placement Date/Time: 07/18/23 1656   Serial / Lot #: [id number]  Expiration Date: 12/11/27  Time Out: Correct patient;Correct site;Correct procedure  Maximum sterile barrier precautions: Hand hygiene;Large sterile sheet;Cap;Sterile probe cover;Mask;St...  Site Condition No complications  Blue Lumen Status Heparin locked  Red Lumen Status Heparin locked  Catheter fill solution Heparin 1000 units/ml  Catheter fill volume (Arterial) 2.1 cc  Catheter fill volume (Venous) 2.1  Dressing Type Transparent;Gauze/Drain sponge  Dressing Status Antimicrobial disc in place;Clean, Dry, Intact  Interventions New dressing  Post treatment catheter status Capped and Clamped

## 2023-07-29 NOTE — ED Provider Notes (Signed)
[place] EMERGENCY DEPARTMENT AT [facility] Provider Note   CSN: [id number] Arrival date & time: 07/29/23  6578     History  Chief Complaint  Patient presents with   Vascular Access Problem    Jeffrey [name] is a 62 y.o. male.  The history is provided by the patient and medical records. No language interpreter was used.  Illness Location:  Here for dialysis Quality:  For dialysis Severity:  Unable to specify Onset quality:  Unable to specify Timing:  Unable to specify Progression:  Unable to specify Chronicity:  Chronic Context:  No other symptoms reported, just here for dialysis. Associated symptoms: no abdominal pain, no chest pain, no congestion, no cough, no fatigue, no fever, no headaches, no nausea, no rash, no shortness of breath and no vomiting        Home Medications Prior to Admission medications   Medication Sig Start Date End Date Taking? Authorizing Provider  acetaminophen (TYLENOL) 325 MG tablet Take 2 tablets (650 mg total) by mouth every 6 (six) hours. 07/23/23   Lockie Mola, MD  amLODipine (NORVASC) 10 MG tablet Take 10 mg by mouth every morning. 04/17/20   [provider]  ASPIRIN LOW DOSE 81 MG EC tablet Take 81 mg by mouth in the morning. 02/22/21   [provider]  atorvastatin (LIPITOR) 40 MG tablet Take 40 mg by mouth in the morning. 02/09/21   [provider]  calcium acetate (PHOSLO) 667 MG capsule Take 2 capsules (1,334 mg total) by mouth with breakfast, with lunch, and with evening meal. 04/10/21   Calvert Cantor, MD  Calcium Carbonate Antacid (CALCIUM CARBONATE, DOSED IN MG ELEMENTAL CALCIUM,) 1250 MG/5ML SUSP Take 5 mLs (500 mg of elemental calcium total) by mouth every 6 (six) hours as needed for indigestion. 04/08/23   Rhetta Mura, MD  carvedilol (COREG) 12.5 MG tablet Take 12.5 mg by mouth 2 (two) times daily with a meal. 04/17/20   [provider]  Cholecalciferol (VITAMIN D) 50 MCG (2000  UT) CAPS Take 2,000 Units by mouth daily.    [provider]  clopidogrel (PLAVIX) 75 MG tablet Take 1 tablet (75 mg total) by mouth daily. 11/02/22   Rhyne, Ames Coupe, PA-C  cyclobenzaprine (FLEXERIL) 10 MG tablet Take 10 mg by mouth 3 (three) times daily as needed for muscle spasms. 12/29/21   [provider]  diazepam (VALIUM) 5 MG tablet Take 5 mg by mouth 2 (two) times daily as needed for muscle spasms. 12/09/21   [provider]  insulin regular human CONCENTRATED (HUMULIN R U-500 KWIKPEN) 500 UNIT/ML KwikPen Inject 45 Units into the skin daily with supper. Patient taking differently: Inject 70-125 Units into the skin 2 (two) times daily with a meal. 09/04/21 08/30/24  [name], MD  nortriptyline (PAMELOR) 10 MG capsule Take 20 mg by mouth at bedtime.    [provider]  oxyCODONE (OXY IR/ROXICODONE) 5 MG immediate release tablet Take 1 tablet (5 mg total) by mouth every 4 (four) hours as needed for severe pain or moderate pain (please give patient scheduled tylenol BEFORE oxycodone). 07/23/23   Lockie Mola, MD  pantoprazole (PROTONIX) 40 MG tablet Take 40 mg by mouth daily before breakfast. 02/09/21   [provider]  pregabalin (LYRICA) 75 MG capsule Take 1 capsule (75 mg total) by mouth See admin instructions. Daily.  Give after dialysis on dialysis days. 01/21/23   [name], MD  sertraline (ZOLOFT) 100 MG tablet Take 100  mg by mouth in the morning. 02/22/21   [provider]  tamsulosin (FLOMAX) 0.4 MG CAPS capsule Take 1 capsule (0.4 mg total) by mouth daily. 11/10/21   [name], MD  torsemide (DEMADEX) 20 MG tablet Take 120 mg by mouth 2 (two) times daily.    [provider]  traZODone (DESYREL) 150 MG tablet Take 150 mg by mouth at bedtime. 12/09/21   [provider]      Allergies    Actos [pioglitazone], Dexmedetomidine, Ibuprofen, Tomato, and Wellbutrin [bupropion]    Review of Systems   Review of  Systems  Constitutional:  Negative for chills, fatigue and fever.  HENT:  Negative for congestion.   Respiratory:  Negative for cough, chest tightness and shortness of breath.   Cardiovascular:  Negative for chest pain.  Gastrointestinal:  Negative for abdominal pain, nausea and vomiting.  Genitourinary:  Negative for flank pain.  Musculoskeletal:  Negative for back pain.  Skin:  Negative for rash.  Neurological:  Negative for headaches.  Psychiatric/Behavioral:  Negative for agitation.   All other systems reviewed and are negative.   Physical Exam Updated Vital Signs BP (!) 149/59 (BP Location: Right Arm)   Pulse 89   Temp 98.7 F (37.1 C)   Resp 20   Ht 5\' 5"  (1.651 m)   Wt 121.7 kg   SpO2 97%   BMI 44.65 kg/m  Physical Exam Vitals and nursing note reviewed.  Constitutional:      General: He is not in acute distress.    Appearance: He is well-developed.  HENT:     Head: Normocephalic and atraumatic.     Mouth/Throat:     Mouth: Mucous membranes are moist.  Eyes:     Conjunctiva/sclera: Conjunctivae normal.  Cardiovascular:     Rate and Rhythm: Normal rate and regular rhythm.     Heart sounds: No murmur heard. Pulmonary:     Effort: Pulmonary effort is normal. No respiratory distress.     Breath sounds: Normal breath sounds. No wheezing, rhonchi or rales.  Chest:     Chest wall: No tenderness.  Abdominal:     Palpations: Abdomen is soft.     Tenderness: There is no abdominal tenderness. There is no guarding or rebound.  Skin:    General: Skin is warm and dry.     Capillary Refill: Capillary refill takes less than 2 seconds.  Neurological:     Mental Status: He is alert.  Psychiatric:        Mood and Affect: Mood normal.     ED Results / Procedures / Treatments   Labs (all labs ordered are listed, but only abnormal results are displayed) Labs Reviewed  HEPATITIS B SURFACE ANTIGEN  HEPATITIS B SURFACE ANTIBODY, QUANTITATIVE     EKG None  Radiology No results found.  Procedures Procedures    Medications Ordered in ED Medications  Chlorhexidine Gluconate Cloth 2 % PADS 6 each (has no administration in time range)  Chlorhexidine Gluconate Cloth 2 % PADS 6 each (has no administration in time range)    ED Course/ Medical Decision Making/ A&P                                 Medical Decision Making   Jeffrey [name] is a 62 y.o. male with a past medical history significant for diabetes, ESRD on dialysis TTS, COPD, hypertension, neuropathies, GERD, and CHF who presents  for dialysis.  According to patient, he has had no other physical symptoms but is here for his scheduled dialysis as he does not go to an outside dialysis center at this time.  He specifically denies chest pain, shortness breath, lightheadedness, nausea, vomiting, or other complaints.  No pain reported.  Patient relaxing listening to gospel music in his electric chair.  Given his lack of other complaints, will hold on diagnostic workup at this time.  Will call nephrology to discuss dialysis.  Nephrology was contacted and they will have the patient get dialysis today.  Anticipate discharge after treatment.     12:30 PM Patient returned back from dialysis and feels well.  He would like to go home.  Patient will continue his outpatient plan.         Final Clinical Impression(s) / ED Diagnoses Final diagnoses:  ESRD on dialysis [facility])    Clinical Impression: 1. ESRD on dialysis [facility])     Disposition: To dialysis then discharge  This note was prepared with assistance of Dragon voice recognition software. Occasional wrong-word or sound-a-like substitutions may have occurred due to the inherent limitations of voice recognition software.      Deon Ivey, Canary Brim, MD 07/29/23 1230

## 2023-07-29 NOTE — ED Triage Notes (Signed)
Patient is here for his routine dialysis, no other complaints.

## 2023-08-01 ENCOUNTER — Emergency Department (HOSPITAL_COMMUNITY)
Admission: EM | Admit: 2023-08-01 | Discharge: 2023-08-01 | Discharge status: Against Medical Advice | Payer: 59 | Attending: Emergency Medicine | Admitting: Emergency Medicine

## 2023-08-01 ENCOUNTER — Other Ambulatory Visit: Payer: Self-pay

## 2023-08-01 ENCOUNTER — Encounter (HOSPITAL_COMMUNITY): Payer: Self-pay

## 2023-08-01 DIAGNOSIS — Z7902 Long term (current) use of antithrombotics/antiplatelets: Secondary | ICD-10-CM | POA: Insufficient documentation

## 2023-08-01 DIAGNOSIS — N186 End stage renal disease: Secondary | ICD-10-CM | POA: Insufficient documentation

## 2023-08-01 DIAGNOSIS — N179 Acute kidney failure, unspecified: Secondary | ICD-10-CM | POA: Diagnosis not present

## 2023-08-01 DIAGNOSIS — Z4931 Encounter for adequacy testing for hemodialysis: Secondary | ICD-10-CM | POA: Diagnosis present

## 2023-08-01 DIAGNOSIS — Z7982 Long term (current) use of aspirin: Secondary | ICD-10-CM | POA: Diagnosis not present

## 2023-08-01 DIAGNOSIS — Z794 Long term (current) use of insulin: Secondary | ICD-10-CM | POA: Diagnosis not present

## 2023-08-01 DIAGNOSIS — D631 Anemia in chronic kidney disease: Secondary | ICD-10-CM | POA: Diagnosis not present

## 2023-08-01 DIAGNOSIS — Z992 Dependence on renal dialysis: Secondary | ICD-10-CM | POA: Insufficient documentation

## 2023-08-01 DIAGNOSIS — Z79890 Hormone replacement therapy (postmenopausal): Secondary | ICD-10-CM | POA: Diagnosis not present

## 2023-08-01 LAB — RENAL FUNCTION PANEL
Albumin: 3.3 g/dL — ABNORMAL LOW (ref 3.5–5.0)
Anion gap: 16 — ABNORMAL HIGH (ref 5–15)
BUN: 79 mg/dL — ABNORMAL HIGH (ref 8–23)
CO2: 24 mmol/L (ref 22–32)
Calcium: 7.2 mg/dL — ABNORMAL LOW (ref 8.9–10.3)
Chloride: 93 mmol/L — ABNORMAL LOW (ref 98–111)
Creatinine, Ser: 5.09 mg/dL — ABNORMAL HIGH (ref 0.61–1.24)
GFR, Estimated: 12 mL/min — ABNORMAL LOW (ref >60)
Glucose, Bld: 318 mg/dL — ABNORMAL HIGH (ref 70–99)
Phosphorus: 4.8 mg/dL — ABNORMAL HIGH (ref 2.5–4.6)
Potassium: 4.1 mmol/L (ref 3.5–5.1)
Sodium: 133 mmol/L — ABNORMAL LOW (ref 135–145)

## 2023-08-01 LAB — CBC
HCT: 31.5 % — ABNORMAL LOW (ref 39.0–52.0)
Hemoglobin: 9.6 g/dL — ABNORMAL LOW (ref 13.0–17.0)
MCH: 27.4 pg (ref 26.0–34.0)
MCHC: 30.5 g/dL (ref 30.0–36.0)
MCV: 90 fL (ref 80.0–100.0)
Platelets: 139 10*3/uL — ABNORMAL LOW (ref 150–400)
RBC: 3.5 MIL/uL — ABNORMAL LOW (ref 4.22–5.81)
RDW: 16.2 % — ABNORMAL HIGH (ref 11.5–15.5)
WBC: 10.3 10*3/uL (ref 4.0–10.5)
nRBC: 0 % (ref 0.0–0.2)

## 2023-08-01 LAB — HEPATITIS B SURFACE ANTIGEN: Hepatitis B Surface Ag: NONREACTIVE

## 2023-08-01 MED ORDER — DARBEPOETIN ALFA 150 MCG/0.3ML IJ SOSY
150.0000 ug | PREFILLED_SYRINGE | Freq: Once | INTRAMUSCULAR | Status: AC
  Administered 2023-08-01: 150 ug via SUBCUTANEOUS
  Filled 2023-08-01: qty 0.3

## 2023-08-01 MED ORDER — CHLORHEXIDINE GLUCONATE CLOTH 2 % EX PADS: 6.0000 | MEDICATED_PAD | Freq: Every day | CUTANEOUS | Status: DC

## 2023-08-01 MED ORDER — HEPARIN SODIUM (PORCINE) 1000 UNIT/ML DIALYSIS
2000.0000 [IU] | Freq: Once | INTRAMUSCULAR | Status: AC
  Administered 2023-08-01: 2000 [IU] via INTRAVENOUS_CENTRAL
  Filled 2023-08-01: qty 2

## 2023-08-01 MED ORDER — HEPARIN SODIUM (PORCINE) 1000 UNIT/ML DIALYSIS
1000.0000 [IU] | INTRAMUSCULAR | Status: DC | PRN
  Administered 2023-08-01: 1000 [IU]
  Filled 2023-08-01: qty 1

## 2023-08-01 NOTE — ED Triage Notes (Signed)
 Pt here for routine dialysis; pt has no complaints/concerns at this time

## 2023-08-01 NOTE — Procedures (Addendum)
We were asked to see this patient for dialysis. Pt was discharged from his OP HD unit in December 2023 due to behavioral issues. He no longer has a home outpatient HD unit. Pt came to ED today requesting hospital dialysis. The plan will be for "ED HD". Patient is not being admitted. Pt will go to the dialysis unit upstairs when they are ready for the pt. Then the pt will get dialysis. When dialysis is completed pt will be sent back to ED for reassessment.      Last OP HD orders (dec 2023):  3:15h  129kg  RUE AVG   Heparin 2000     - last hep B labs done here on --> 07/01/23   - pt typically comes on Tu, Th and Sat for hospital HD   Anemia of esrd:  Pt had darbe in late April and in early May, but Hb continued to drop, so dosing of darbepoeitin was increased up to 100.   Tsat was low in May at 11% --> we gave him 1 gm load over 4 HD sessions. Repeat tsat on 6/25 was not much better at 12%, ferr 450. Pt got another ferrlecit 250mg  x 4 in June- july.  Hb seems to be responding now, around 10 in early August.  Cont darbe q 1-2 wks.    The darbepoeitin needs to ordered for this patient as "once in dialysis" and not "SQ at 1800" because he needs to be ready to leave at 5 pm to catch his ride home.    HD access: Pt had new LUA AVG and [facility] TDC placed on 04/07/23 by Dr Edilia Bo. TDC was removed on 05/23/23. Using AVG now.       Date             Hb         Tsat/ ferritin    Darbe SQ       ferric gluconate 12/02/22            11.0       22%/ 935 01/24/23           9.0        31%/ 1056   03/16/23            8.5                             60 mcg 03/30/23            8.0                             60 mcg  04/11/23            8.1        11%/ 545      100 mcg 5/18                                                                             250mg  #1 04/24/23             8.0  60 mcg  04/27/23             7.3                                                      250mg  #2 04/29/23                                                 100 mcg           250mg  #3       6/06                 7.5                               150 mcg 6/08                 7.9                                                        250mg  #4 05/11/23            8.1                               05/18/23            8.2                                150 mcg          250mg   6/25                                  12%/ 450 05/27/23                                                  100 mcg 05/31/23            8.4                                 150 mcg           250mg      06/06/23                                                    150 mcg         250mg  7/25  150 mcg         250 mg 8/03                  10.4 8/10                  10.3                                 150 mcg 8/31    9.8 9/03             150 mcg   Vinson Moselle  MD  [facility] 08/01/2023, 1:55 PM  Recent Labs  Lab 07/29/23 0845 08/01/23 0921 08/01/23 1000  HGB 9.8* 9.6*  --   ALBUMIN 3.6  --  3.3*  CALCIUM 7.3*  --  7.2*  PHOS 5.2*  --  4.8*  CREATININE 5.09*  --  5.09*  K 3.9  --  4.1    Inpatient medications:  Chlorhexidine Gluconate Cloth  6 each Topical Q0600    heparin

## 2023-08-01 NOTE — Progress Notes (Signed)
HD Progress Note:  0856: Called ED for report, primary RN will call back.

## 2023-08-01 NOTE — ED Provider Notes (Signed)
[place] EMERGENCY DEPARTMENT AT [facility] Provider Note   CSN: [id number] Arrival date & time: 08/01/23  7846     History  No chief complaint on file.   Jeffrey [name] is a 62 y.o. male.  Patient presents to the emergency department for dialysis.  Patient reports he was last dialyzed on 831.  He denies any current problems.  Patient does not go to an outside dialysis center he states he normally gets his dialysis here.  Patient denies any shortness of breath he does not feel like he is significantly volume overloaded.  Tells me that he is a very hard stick and that we normally cannot get labs on him in the emergency department.  The history is provided by the patient. No language interpreter was used.       Home Medications Prior to Admission medications   Medication Sig Start Date End Date Taking? Authorizing Provider  acetaminophen (TYLENOL) 325 MG tablet Take 2 tablets (650 mg total) by mouth every 6 (six) hours. 07/23/23   Lockie Mola, MD  amLODipine (NORVASC) 10 MG tablet Take 10 mg by mouth every morning. 04/17/20   [provider]  ASPIRIN LOW DOSE 81 MG EC tablet Take 81 mg by mouth in the morning. 02/22/21   [provider]  atorvastatin (LIPITOR) 40 MG tablet Take 40 mg by mouth in the morning. 02/09/21   [provider]  calcium acetate (PHOSLO) 667 MG capsule Take 2 capsules (1,334 mg total) by mouth with breakfast, with lunch, and with evening meal. 04/10/21   Calvert Cantor, MD  Calcium Carbonate Antacid (CALCIUM CARBONATE, DOSED IN MG ELEMENTAL CALCIUM,) 1250 MG/5ML SUSP Take 5 mLs (500 mg of elemental calcium total) by mouth every 6 (six) hours as needed for indigestion. 04/08/23   Rhetta Mura, MD  carvedilol (COREG) 12.5 MG tablet Take 12.5 mg by mouth 2 (two) times daily with a meal. 04/17/20   [provider]  Cholecalciferol (VITAMIN D) 50 MCG (2000 UT) CAPS Take 2,000 Units by mouth daily.    [provider]  clopidogrel (PLAVIX) 75 MG tablet Take 1 tablet (75 mg total) by mouth daily. 11/02/22   Rhyne, Ames Coupe, PA-C  cyclobenzaprine (FLEXERIL) 10 MG tablet Take 10 mg by mouth 3 (three) times daily as needed for muscle spasms. 12/29/21   [provider]  diazepam (VALIUM) 5 MG tablet Take 5 mg by mouth 2 (two) times daily as needed for muscle spasms. 12/09/21   [provider]  insulin regular human CONCENTRATED (HUMULIN R U-500 KWIKPEN) 500 UNIT/ML KwikPen Inject 45 Units into the skin daily with supper. Patient taking differently: Inject 70-125 Units into the skin 2 (two) times daily with a meal. 09/04/21 08/30/24  [name], MD  nortriptyline (PAMELOR) 10 MG capsule Take 20 mg by mouth at bedtime.    [provider]  oxyCODONE (OXY IR/ROXICODONE) 5 MG immediate release tablet Take 1 tablet (5 mg total) by mouth every 4 (four) hours as needed for severe pain or moderate pain (please give patient scheduled tylenol BEFORE oxycodone). 07/23/23   Lockie Mola, MD  pantoprazole (PROTONIX) 40 MG tablet Take 40 mg by mouth daily before breakfast. 02/09/21   [provider]  pregabalin (LYRICA) 75 MG capsule Take 1 capsule (75 mg total) by mouth See admin instructions. Daily.  Give after dialysis on dialysis days. 01/21/23   [name], MD  sertraline (ZOLOFT) 100 MG tablet Take 100 mg by mouth  in the morning. 02/22/21   [provider]  tamsulosin (FLOMAX) 0.4 MG CAPS capsule Take 1 capsule (0.4 mg total) by mouth daily. 11/10/21   [name], MD  torsemide (DEMADEX) 20 MG tablet Take 120 mg by mouth 2 (two) times daily.    [provider]  traZODone (DESYREL) 150 MG tablet Take 150 mg by mouth at bedtime. 12/09/21   [provider]      Allergies    Actos [pioglitazone], Dexmedetomidine, Ibuprofen, Tomato, and Wellbutrin [bupropion]    Review of Systems   Review of Systems  All other systems reviewed and are  negative.   Physical Exam Updated Vital Signs BP (!) 160/73   Pulse 87   Temp (!) 97.5 F (36.4 C) (Oral)   Resp 20   SpO2 100%  Physical Exam Vitals and nursing note reviewed.  Constitutional:      Appearance: He is well-developed.  HENT:     Head: Normocephalic.     Mouth/Throat:     Mouth: Mucous membranes are moist.  Cardiovascular:     Rate and Rhythm: Normal rate.  Pulmonary:     Effort: Pulmonary effort is normal.  Abdominal:     General: There is no distension.  Musculoskeletal:     Cervical back: Normal range of motion.  Skin:    General: Skin is warm.  Neurological:     General: No focal deficit present.     Mental Status: He is alert and oriented to person, place, and time.  Psychiatric:        Mood and Affect: Mood normal.     ED Results / Procedures / Treatments   Labs (all labs ordered are listed, but only abnormal results are displayed) Labs Reviewed - No data to display  EKG None  Radiology No results found.  Procedures Procedures    Medications Ordered in ED Medications  Chlorhexidine Gluconate Cloth 2 % PADS 6 each (has no administration in time range)  Darbepoetin Alfa (ARANESP) injection 150 mcg (has no administration in time range)    ED Course/ Medical Decision Making/ A&P                                 Medical Decision Making Reports he is here to have dialysis he gets his dialysis here at the hospital.  Amount and/or Complexity of Data Reviewed External Data Reviewed: notes.    Details: Neurology notes reviewed Discussion of management or test interpretation with external provider(s): Discussed the patient with Dr. Arta Silence.  Will arrange dialysis for the patient.  Patient will return to the emergency department after dialysis for reassessment.   Patient eating during my exam.  He does not seem to be in any distress.        Final Clinical Impression(s) / ED Diagnoses Final diagnoses:  Acute renal failure on  dialysis [facility])    Rx / DC Orders ED Discharge Orders     None         Elson Areas, [place] 08/01/23 1308    Rozelle Logan, DO 08/01/23 1238

## 2023-08-01 NOTE — Progress Notes (Signed)
HD Progress note:   08/01/23 1349  Vitals  Temp 97.8 F (36.6 C)  Temp Source Oral  MAP (mmHg) 98  BP Location Right Wrist  BP Method Automatic  Patient Position (if appropriate) Lying  Pulse Rate 91  Pulse Rate Source Monitor  ECG Heart Rate 91  Resp (!) 21  Oxygen Therapy  SpO2 100 %  O2 Device Nasal Cannula  O2 Flow Rate (L/min) 3 L/min  Patient Activity (if Appropriate) In bed  Pulse Oximetry Type Continuous  Oximetry Probe Site Changed No  During Treatment Monitoring  HD Safety Checks Performed Yes  Intra-Hemodialysis Comments Tx completed;Tolerated well  Dialysis Fluid Bolus Normal Saline  Bolus Amount (mL) 300 mL  Post Treatment  Dialyzer Clearance Clear  Hemodialysis Intake (mL) 0 mL  Liters Processed 78  Fluid Removed (mL) 3000 mL  Tolerated HD Treatment Yes  Post-Hemodialysis Comments Tolerated HD well, denies any symptoms, no cramping today.  Hemodialysis Catheter Left Internal jugular Double lumen Permanent (Tunneled)  Placement Date/Time: 07/18/23 1656   Serial / Lot #: [id number]  Expiration Date: 12/11/27  Time Out: Correct patient;Correct site;Correct procedure  Maximum sterile barrier precautions: Hand hygiene;Large sterile sheet;Cap;Sterile probe cover;Mask;St...  Site Condition No complications  Blue Lumen Status Flushed;Heparin locked;Dead end cap in place  Red Lumen Status Flushed;Heparin locked;Dead end cap in place  Purple Lumen Status N/A  Catheter fill solution Heparin 1000 units/ml  Catheter fill volume (Arterial) 2.1 cc  Catheter fill volume (Venous) 2.1  Dressing Type Transparent  Dressing Status Antimicrobial disc in place  Interventions New dressing  Drainage Description None  Dressing Change Due 08/08/23  Post treatment catheter status Capped and Clamped    Tolerated HD well today. No cramping, no clotting, no loss of blood. Total UF removed (net) = . Unable to weigh, stretcher not zeroed.

## 2023-08-02 LAB — HEPATITIS B SURFACE ANTIBODY, QUANTITATIVE: Hep B S AB Quant (Post): 46.5 m[IU]/mL

## 2023-08-03 ENCOUNTER — Emergency Department (HOSPITAL_COMMUNITY)
Admission: EM | Admit: 2023-08-03 | Discharge: 2023-08-03 | Disposition: A | Discharge status: Home / Self Care | Payer: 59 | Attending: Emergency Medicine | Admitting: Emergency Medicine

## 2023-08-03 DIAGNOSIS — I132 Hypertensive heart and chronic kidney disease with heart failure and with stage 5 chronic kidney disease, or end stage renal disease: Secondary | ICD-10-CM | POA: Insufficient documentation

## 2023-08-03 DIAGNOSIS — Z79899 Other long term (current) drug therapy: Secondary | ICD-10-CM | POA: Insufficient documentation

## 2023-08-03 DIAGNOSIS — Z7982 Long term (current) use of aspirin: Secondary | ICD-10-CM | POA: Diagnosis not present

## 2023-08-03 DIAGNOSIS — E1122 Type 2 diabetes mellitus with diabetic chronic kidney disease: Secondary | ICD-10-CM | POA: Diagnosis not present

## 2023-08-03 DIAGNOSIS — Z992 Dependence on renal dialysis: Secondary | ICD-10-CM | POA: Insufficient documentation

## 2023-08-03 DIAGNOSIS — Z794 Long term (current) use of insulin: Secondary | ICD-10-CM | POA: Diagnosis not present

## 2023-08-03 DIAGNOSIS — I509 Heart failure, unspecified: Secondary | ICD-10-CM | POA: Diagnosis not present

## 2023-08-03 DIAGNOSIS — N186 End stage renal disease: Secondary | ICD-10-CM | POA: Diagnosis present

## 2023-08-03 DIAGNOSIS — J449 Chronic obstructive pulmonary disease, unspecified: Secondary | ICD-10-CM | POA: Insufficient documentation

## 2023-08-03 MED ORDER — PENTAFLUOROPROP-TETRAFLUOROETH EX AERO: 1.0000 | INHALATION_SPRAY | CUTANEOUS | Status: DC | PRN

## 2023-08-03 MED ORDER — HEPARIN SODIUM (PORCINE) 1000 UNIT/ML DIALYSIS
2000.0000 [IU] | Freq: Once | INTRAMUSCULAR | Status: AC
  Administered 2023-08-03: 2000 [IU] via INTRAVENOUS_CENTRAL
  Filled 2023-08-03: qty 2

## 2023-08-03 MED ORDER — LIDOCAINE-PRILOCAINE 2.5-2.5 % EX CREA: 1.0000 | TOPICAL_CREAM | CUTANEOUS | Status: DC | PRN

## 2023-08-03 MED ORDER — HEPARIN SODIUM (PORCINE) 1000 UNIT/ML IJ SOLN
4200.0000 [IU] | Freq: Once | INTRAMUSCULAR | Status: AC
  Administered 2023-08-03: 4200 [IU]
  Filled 2023-08-03: qty 4.2

## 2023-08-03 MED ORDER — HEPARIN SODIUM (PORCINE) 1000 UNIT/ML DIALYSIS: 1000.0000 [IU] | INTRAMUSCULAR | Status: DC | PRN

## 2023-08-03 MED ORDER — ANTICOAGULANT SODIUM CITRATE 4% (200MG/5ML) IV SOLN: 5.0000 mL | Status: DC | PRN

## 2023-08-03 MED ORDER — LIDOCAINE HCL (PF) 1 % IJ SOLN: 5.0000 mL | INTRAMUSCULAR | Status: DC | PRN

## 2023-08-03 MED ORDER — CHLORHEXIDINE GLUCONATE CLOTH 2 % EX PADS: 6.0000 | MEDICATED_PAD | Freq: Every day | CUTANEOUS | Status: DC

## 2023-08-03 NOTE — ED Triage Notes (Signed)
Pt here for dialysis, completed full treatment on Tuesday.

## 2023-08-03 NOTE — Progress Notes (Signed)
Received patient in bed to unit.  Alert and oriented.  Informed consent signed and in chart.   TX duration: 3.25 hours  Patient tolerated well.  Transported back to the room  Alert, without acute distress.  Hand-off given to patient's nurse.   Access used: Lef HD Cath Access issues: none  Total UF removed: 1900 due to patient cramping at the end Medication(s) given: none   08/03/23 1213  Vitals  Temp 98.1 F (36.7 C)  Temp Source Oral  BP (!) 153/108  MAP (mmHg) 122  Pulse Rate 90  ECG Heart Rate 90  Resp (!) 21  Oxygen Therapy  SpO2 98 %  O2 Device Nasal Cannula  O2 Flow Rate (L/min) 3 L/min  During Treatment Monitoring  HD Safety Checks Performed Yes  Intra-Hemodialysis Comments Tx completed;Tolerated well  Dialysis Fluid Bolus Normal Saline  Bolus Amount (mL) 300 mL  Hemodialysis Catheter Left Internal jugular Double lumen Permanent (Tunneled)  Placement Date/Time: 07/18/23 1656   Serial / Lot #: [id number]  Expiration Date: 12/11/27  Time Out: Correct patient;Correct site;Correct procedure  Maximum sterile barrier precautions: Hand hygiene;Large sterile sheet;Cap;Sterile probe cover;Mask;St...  Site Condition No complications  Blue Lumen Status Flushed;Dead end cap in place;Heparin locked  Red Lumen Status Flushed;Heparin locked;Dead end cap in place  Purple Lumen Status N/A  Catheter fill solution Heparin 1000 units/ml  Catheter fill volume (Arterial) 2.1 cc  Catheter fill volume (Venous) 2.1  Dressing Type Transparent  Dressing Status Antimicrobial disc in place;Clean, Dry, Intact  Interventions Other (Comment) (deaccessed)  Drainage Description None  Dressing Change Due 08/08/23  Post treatment catheter status Capped and Clamped     Stacie Glaze LPN [facility] D[facility]

## 2023-08-03 NOTE — Procedures (Signed)
We were asked to see this patient for dialysis. Pt was discharged from his OP HD unit in December 2023 due to behavioral issues. He no longer has a home outpatient HD unit. Pt came to ED today requesting hospital dialysis. The plan will be for "ED HD". Patient is not being admitted. Pt will go to the dialysis unit upstairs when they are ready for the pt. Then the pt will get dialysis. When dialysis is completed pt will be sent back to ED for reassessment.      Last OP HD orders (dec 2023):  3:15h  129kg  RUE AVG   Heparin 2000     - last hep B labs done here on --> 08/01/23   - pt typically comes on Tu, Th and Sat for hospital HD   Anemia of esrd:  Pt had darbe in late April and in early May, but Hb continued to drop, so dosing of darbepoeitin was increased up to 100.   Tsat was low in May at 11% --> we gave him 1 gm load over 4 HD sessions. Repeat tsat on 6/25 was not much better at 12%, ferr 450. Pt got another ferrlecit 250mg  x 4 in June- july.  Hb seems to be responding now, around 10 in early August.  Cont darbe q 1-2 wks.    The darbepoeitin needs to ordered for this patient as "once in dialysis" and not "SQ at 1800" because he needs to be ready to leave at 5 pm to catch his ride home.    HD access: Pt had new LUA AVG and LIJ TDC placed on 04/07/23 by Dr Edilia Bo. TDC was removed on 05/23/23. Using AVG now.       Date             Hb         Tsat/ ferritin    Darbe SQ       ferric gluconate 12/02/22            11.0       22%/ 935 01/24/23           9.0        31%/ 1056   04/11/23            8.1        11%/ 545      100 mcg 5/18                                                                             250mg  #1 04/24/23             8.0                              60 mcg  04/27/23             7.3  250mg  #2 04/29/23                                                100 mcg           250mg  #3       6/06                 7.5                                150 mcg 6/08                 7.9                                                        250mg  #4                          05/18/23            8.2                                150 mcg          250mg   6/25                                  12%/ 450 05/27/23                                                  100 mcg 05/31/23            8.4                                 150 mcg           250mg      06/06/23                                                    150 mcg         250mg  7/25                                                         150 mcg         250 mg 8/03                  10.4 8/10                  10.3  150 mcg 8/31                   9.8 9/03                                                            150 mcg   I was present at this dialysis session, have reviewed the session and made  appropriate changes Vinson Moselle MD  [facility] 08/03/2023, 11:14 AM

## 2023-08-04 NOTE — ED Provider Notes (Signed)
[facility] KID[facility] DIALYSIS UNIT Provider Note   CSN: [id number] Arrival date & time: 08/03/23  0731     History  Chief Complaint  Patient presents with   Vascular Access Problem    Jeffrey [name] is a 62 y.o. male.  HPI      62 year old male with history below including history of ESRD on dialysis Tuesday Thursday Saturday with hospital dialysis plan in place, who presents with concern for need for dialysis.  His last dialysis session was 2 days ago.  He denies any other symptoms including no shortness of breath, nausea, vomiting or chest pain.  He is listening to [employer] as he waits for his dialysis.   Past Medical History:  Diagnosis Date   Altered mental status 05/04/2021   Arthritis    Blood transfusion without reported diagnosis    CHF (congestive heart failure) (HCC)    COPD (chronic obstructive pulmonary disease) (HCC)    Depression    Diabetes mellitus without complication (HCC)    type 2   Dyspnea    ESRD on hemodialysis (HCC)    Tues Thurs Sat   GERD (gastroesophageal reflux disease)    protonix   Heart murmur    as a child, no problems   Hypertension    Myocardial infarction (HCC)    PTSD (post-traumatic stress disorder)    Sepsis (HCC)    Sleep apnea    occasional uses CPAP   Uses powered wheelchair      Home Medications Prior to Admission medications   Medication Sig Start Date End Date Taking? Authorizing Provider  acetaminophen (TYLENOL) 325 MG tablet Take 2 tablets (650 mg total) by mouth every 6 (six) hours. 07/23/23   Lockie Mola, MD  amLODipine (NORVASC) 10 MG tablet Take 10 mg by mouth every morning. 04/17/20   [provider]  ASPIRIN LOW DOSE 81 MG EC tablet Take 81 mg by mouth in the morning. 02/22/21   [provider]  atorvastatin (LIPITOR) 40 MG tablet Take 40 mg by mouth in the morning. 02/09/21   [provider]  calcium acetate (PHOSLO) 667 MG capsule Take 2 capsules (1,334 mg  total) by mouth with breakfast, with lunch, and with evening meal. 04/10/21   Calvert Cantor, MD  Calcium Carbonate Antacid (CALCIUM CARBONATE, DOSED IN MG ELEMENTAL CALCIUM,) 1250 MG/5ML SUSP Take 5 mLs (500 mg of elemental calcium total) by mouth every 6 (six) hours as needed for indigestion. 04/08/23   Rhetta Mura, MD  carvedilol (COREG) 12.5 MG tablet Take 12.5 mg by mouth 2 (two) times daily with a meal. 04/17/20   [provider]  Cholecalciferol (VITAMIN D) 50 MCG (2000 UT) CAPS Take 2,000 Units by mouth daily.    [provider]  clopidogrel (PLAVIX) 75 MG tablet Take 1 tablet (75 mg total) by mouth daily. 11/02/22   Rhyne, Ames Coupe, PA-C  cyclobenzaprine (FLEXERIL) 10 MG tablet Take 10 mg by mouth 3 (three) times daily as needed for muscle spasms. 12/29/21   [provider]  diazepam (VALIUM) 5 MG tablet Take 5 mg by mouth 2 (two) times daily as needed for muscle spasms. 12/09/21   [provider]  insulin regular human CONCENTRATED (HUMULIN R U-500 KWIKPEN) 500 UNIT/ML KwikPen Inject 45 Units into the skin daily with supper. Patient taking differently: Inject 70-125 Units into the skin 2 (two) times daily with a meal. 09/04/21 08/30/24  [name], MD  nortriptyline (PAMELOR) 10 MG capsule  Take 20 mg by mouth at bedtime.    [provider]  oxyCODONE (OXY IR/ROXICODONE) 5 MG immediate release tablet Take 1 tablet (5 mg total) by mouth every 4 (four) hours as needed for severe pain or moderate pain (please give patient scheduled tylenol BEFORE oxycodone). 07/23/23   Lockie Mola, MD  pantoprazole (PROTONIX) 40 MG tablet Take 40 mg by mouth daily before breakfast. 02/09/21   [provider]  pregabalin (LYRICA) 75 MG capsule Take 1 capsule (75 mg total) by mouth See admin instructions. Daily.  Give after dialysis on dialysis days. 01/21/23   [name], MD  sertraline (ZOLOFT) 100 MG tablet Take 100 mg by mouth in the morning.  02/22/21   [provider]  tamsulosin (FLOMAX) 0.4 MG CAPS capsule Take 1 capsule (0.4 mg total) by mouth daily. 11/10/21   [name], MD  torsemide (DEMADEX) 20 MG tablet Take 120 mg by mouth 2 (two) times daily.    [provider]  traZODone (DESYREL) 150 MG tablet Take 150 mg by mouth at bedtime. 12/09/21   [provider]      Allergies    Actos [pioglitazone], Dexmedetomidine, Ibuprofen, Tomato, and Wellbutrin [bupropion]    Review of Systems   Review of Systems  Physical Exam Updated Vital Signs BP 139/63   Pulse 85   Temp 98.1 F (36.7 C) (Oral)   Resp 18   SpO2 94%  Physical Exam Vitals and nursing note reviewed.  Constitutional:      General: He is not in acute distress.    Appearance: Normal appearance. He is not ill-appearing, toxic-appearing or diaphoretic.  HENT:     Head: Normocephalic.  Eyes:     Conjunctiva/sclera: Conjunctivae normal.  Cardiovascular:     Rate and Rhythm: Normal rate and regular rhythm.     Pulses: Normal pulses.  Pulmonary:     Effort: Pulmonary effort is normal. No respiratory distress.  Musculoskeletal:        General: No deformity or signs of injury.     Cervical back: No rigidity.  Skin:    General: Skin is warm and dry.     Coloration: Skin is not jaundiced or pale.  Neurological:     General: No focal deficit present.     Mental Status: He is alert and oriented to person, place, and time.     ED Results / Procedures / Treatments   Labs (all labs ordered are listed, but only abnormal results are displayed) Labs Reviewed - No data to display  EKG None  Radiology No results found.  Procedures Procedures    Medications Ordered in ED Medications  heparin injection 2,000 Units (2,000 Units Dialysis Given 08/03/23 0852)  heparin sodium (porcine) injection 4,200 Units (4,200 Units Intracatheter Given 08/03/23 1212)    ED Course/ Medical Decision Making/ A&P                                  Medical Decision Making   62 year old male with history above ncluding history of ESRD on dialysis Tuesday Thursday Saturday with hospital dialysis plan in place, who presents with concern for need for dialysis.  Will allow them to check labs upstairs given he is asymptomatic and has not missed any sessions.  Consulted with Dr. Roslyn Smiling, and Jeffrey [name] was taken to dialysis.  He returns from dialysis in stable condition and is stable for discharge.  Final Clinical Impression(s) / ED Diagnoses Final diagnoses:  ESRD (end stage renal disease) (HCC)    Rx / DC Orders ED Discharge Orders     None         Alvira Monday, MD 08/04/23 [phone]

## 2023-08-05 ENCOUNTER — Other Ambulatory Visit: Payer: Self-pay

## 2023-08-05 ENCOUNTER — Emergency Department (HOSPITAL_COMMUNITY)
Admission: EM | Admit: 2023-08-05 | Discharge: 2023-08-05 | Disposition: A | Discharge status: Home / Self Care | Payer: 59 | Attending: Student | Admitting: Student

## 2023-08-05 DIAGNOSIS — Z7982 Long term (current) use of aspirin: Secondary | ICD-10-CM | POA: Diagnosis not present

## 2023-08-05 DIAGNOSIS — I509 Heart failure, unspecified: Secondary | ICD-10-CM | POA: Diagnosis not present

## 2023-08-05 DIAGNOSIS — J449 Chronic obstructive pulmonary disease, unspecified: Secondary | ICD-10-CM | POA: Insufficient documentation

## 2023-08-05 DIAGNOSIS — Z79899 Other long term (current) drug therapy: Secondary | ICD-10-CM | POA: Insufficient documentation

## 2023-08-05 DIAGNOSIS — Z992 Dependence on renal dialysis: Secondary | ICD-10-CM | POA: Diagnosis not present

## 2023-08-05 DIAGNOSIS — Z794 Long term (current) use of insulin: Secondary | ICD-10-CM | POA: Diagnosis not present

## 2023-08-05 DIAGNOSIS — E1122 Type 2 diabetes mellitus with diabetic chronic kidney disease: Secondary | ICD-10-CM | POA: Insufficient documentation

## 2023-08-05 DIAGNOSIS — E1142 Type 2 diabetes mellitus with diabetic polyneuropathy: Secondary | ICD-10-CM | POA: Insufficient documentation

## 2023-08-05 DIAGNOSIS — D72829 Elevated white blood cell count, unspecified: Secondary | ICD-10-CM | POA: Insufficient documentation

## 2023-08-05 DIAGNOSIS — N186 End stage renal disease: Secondary | ICD-10-CM | POA: Diagnosis present

## 2023-08-05 DIAGNOSIS — I132 Hypertensive heart and chronic kidney disease with heart failure and with stage 5 chronic kidney disease, or end stage renal disease: Secondary | ICD-10-CM | POA: Diagnosis not present

## 2023-08-05 LAB — CBC WITH DIFFERENTIAL/PLATELET
Abs Immature Granulocytes: 0.26 K/uL — ABNORMAL HIGH (ref 0.00–0.07)
Basophils Absolute: 0.1 K/uL (ref 0.0–0.1)
Basophils Relative: 1 %
Eosinophils Absolute: 0.6 K/uL — ABNORMAL HIGH (ref 0.0–0.5)
Eosinophils Relative: 6 %
HCT: 31.5 % — ABNORMAL LOW (ref 39.0–52.0)
Hemoglobin: 9.5 g/dL — ABNORMAL LOW (ref 13.0–17.0)
Immature Granulocytes: 2 %
Lymphocytes Relative: 8 %
Lymphs Abs: 0.9 K/uL (ref 0.7–4.0)
MCH: 26.7 pg (ref 26.0–34.0)
MCHC: 30.2 g/dL (ref 30.0–36.0)
MCV: 88.5 fL (ref 80.0–100.0)
Monocytes Absolute: 0.5 K/uL (ref 0.1–1.0)
Monocytes Relative: 5 %
Neutro Abs: 8.5 K/uL — ABNORMAL HIGH (ref 1.7–7.7)
Neutrophils Relative %: 78 %
Platelets: 98 K/uL — ABNORMAL LOW (ref 150–400)
RBC: 3.56 MIL/uL — ABNORMAL LOW (ref 4.22–5.81)
RDW: 16.3 % — ABNORMAL HIGH (ref 11.5–15.5)
Smear Review: DECREASED
WBC: 10.8 K/uL — ABNORMAL HIGH (ref 4.0–10.5)
nRBC: 0 % (ref 0.0–0.2)

## 2023-08-05 LAB — RENAL FUNCTION PANEL
Albumin: 3.5 g/dL (ref 3.5–5.0)
Anion gap: 17 — ABNORMAL HIGH (ref 5–15)
BUN: 64 mg/dL — ABNORMAL HIGH (ref 8–23)
CO2: 25 mmol/L (ref 22–32)
Calcium: 7.6 mg/dL — ABNORMAL LOW (ref 8.9–10.3)
Chloride: 91 mmol/L — ABNORMAL LOW (ref 98–111)
Creatinine, Ser: 5.02 mg/dL — ABNORMAL HIGH (ref 0.61–1.24)
GFR, Estimated: 12 mL/min — ABNORMAL LOW (ref >60)
Glucose, Bld: 431 mg/dL — ABNORMAL HIGH (ref 70–99)
Phosphorus: 4 mg/dL (ref 2.5–4.6)
Potassium: 4.4 mmol/L (ref 3.5–5.1)
Sodium: 133 mmol/L — ABNORMAL LOW (ref 135–145)

## 2023-08-05 MED ORDER — LIDOCAINE-PRILOCAINE 2.5-2.5 % EX CREA: 1.0000 | TOPICAL_CREAM | CUTANEOUS | Status: DC | PRN

## 2023-08-05 MED ORDER — ALTEPLASE 2 MG IJ SOLR: 2.0000 mg | Freq: Once | INTRAMUSCULAR | Status: DC | PRN

## 2023-08-05 MED ORDER — ANTICOAGULANT SODIUM CITRATE 4% (200MG/5ML) IV SOLN: 5.0000 mL | Status: DC | PRN

## 2023-08-05 MED ORDER — PENTAFLUOROPROP-TETRAFLUOROETH EX AERO: 1.0000 | INHALATION_SPRAY | CUTANEOUS | Status: DC | PRN

## 2023-08-05 MED ORDER — NEPRO/CARBSTEADY PO LIQD: 237.0000 mL | ORAL | Status: DC | PRN

## 2023-08-05 MED ORDER — HEPARIN SODIUM (PORCINE) 1000 UNIT/ML IJ SOLN
INTRAMUSCULAR | Status: AC
  Filled 2023-08-05: qty 3

## 2023-08-05 MED ORDER — HEPARIN SODIUM (PORCINE) 1000 UNIT/ML DIALYSIS: 40.0000 [IU]/kg | INTRAMUSCULAR | Status: DC | PRN

## 2023-08-05 MED ORDER — LIDOCAINE HCL (PF) 1 % IJ SOLN: 5.0000 mL | INTRAMUSCULAR | Status: DC | PRN

## 2023-08-05 MED ORDER — HEPARIN SODIUM (PORCINE) 1000 UNIT/ML DIALYSIS: 1000.0000 [IU] | INTRAMUSCULAR | Status: DC | PRN

## 2023-08-05 MED ORDER — HEPARIN SODIUM (PORCINE) 1000 UNIT/ML DIALYSIS
20.0000 [IU]/kg | INTRAMUSCULAR | Status: DC | PRN
  Administered 2023-08-05: 2400 [IU] via INTRAVENOUS_CENTRAL
  Filled 2023-08-05: qty 3

## 2023-08-05 MED ORDER — HEPARIN SODIUM (PORCINE) 1000 UNIT/ML DIALYSIS: 20.0000 [IU]/kg | INTRAMUSCULAR | Status: DC | PRN

## 2023-08-05 MED ORDER — CHLORHEXIDINE GLUCONATE CLOTH 2 % EX PADS: 6.0000 | MEDICATED_PAD | Freq: Every day | CUTANEOUS | Status: DC

## 2023-08-05 MED ORDER — HEPARIN SODIUM (PORCINE) 1000 UNIT/ML IJ SOLN
INTRAMUSCULAR | Status: AC
  Filled 2023-08-05: qty 5

## 2023-08-05 NOTE — Progress Notes (Signed)
Received patient in bed to unit.  Alert and oriented.  Informed consent signed and in chart.   TX duration:3.04, self terminated early did not need to sign AMA paperwork  Patient tolerated well.  Transported back to the room  Alert, without acute distress.  Hand-off given to patient's nurse.   Access used: L HD Cath Access issues: none  Total UF removed: 2300 Medication(s) given: none   08/05/23 1208  Vitals  Temp 98 F (36.7 C)  Temp Source Oral  BP (!) 116/53  MAP (mmHg) 70  Pulse Rate (!) 103  ECG Heart Rate (!) 101  Resp 18  Oxygen Therapy  SpO2 100 %  O2 Device Nasal Cannula  O2 Flow Rate (L/min) 3 L/min  During Treatment Monitoring  HD Safety Checks Performed Yes  Intra-Hemodialysis Comments Tx completed;Tolerated well (self terminated session)  Dialysis Fluid Bolus Normal Saline  Bolus Amount (mL) 300 mL  Hemodialysis Catheter Left Internal jugular Double lumen Permanent (Tunneled)  Placement Date/Time: 07/18/23 1656   Serial / Lot #: [id number]  Expiration Date: 12/11/27  Time Out: Correct patient;Correct site;Correct procedure  Maximum sterile barrier precautions: Hand hygiene;Large sterile sheet;Cap;Sterile probe cover;Mask;St...  Interventions  (deaccessed)     Stacie Glaze LPN [facility] Dialysis Unit

## 2023-08-05 NOTE — ED Provider Notes (Signed)
[place] [facility] AT [facility] Provider Note  CSN: [id number] Arrival date & time: 08/05/23 7829  Chief Complaint(s) No chief complaint on file.  HPI Jeffrey [name] is a 62 y.o. male with PMH ESRD on hemodialysis Tuesday Thursday Saturday who presents emerged part for evaluation of routine dialysis.  Patient arrives with no medical complaints today.   Past Medical History Past Medical History:  Diagnosis Date   Altered mental status 05/04/2021   Arthritis    Blood transfusion without reported diagnosis    CHF (congestive heart failure) (HCC)    COPD (chronic obstructive pulmonary disease) (HCC)    Depression    Diabetes mellitus without complication (HCC)    type 2   Dyspnea    ESRD on hemodialysis (HCC)    Tues Thurs Sat   GERD (gastroesophageal reflux disease)    protonix   Heart murmur    as a child, no problems   Hypertension    Myocardial infarction [facility])    PTSD (post-traumatic stress disorder)    Sepsis (HCC)    Sleep apnea    occasional uses CPAP   Uses powered wheelchair    Patient Active Problem List   Diagnosis Date Noted   Visual field defect of right eye 07/20/2023   Anemia due to chronic kidney disease, on chronic dialysis (HCC) 07/19/2023   Hyperuricemia 07/19/2023   Left arm pain 07/17/2023   Shortness of breath 06/01/2023   Hyperglycemia due to diabetes mellitus (HCC) 04/06/2023   Accelerated hypertension 04/06/2023   ESRD on dialysis (HCC) 03/03/2023   Acute on chronic respiratory failure with hypoxia (HCC) 11/24/2022   Uremia 11/24/2022   Encounter for removal of sutures 07/15/2022   Stress reaction of bone 07/12/2022   Benign prostatic hyperplasia with weak urinary stream 12/30/2021   Swelling of right upper extremity 11/07/2021   S/P reverse total shoulder arthroplasty, right 10/27/2021   AV graft malfunction (HCC) 09/01/2021   Dialysis AV fistula malfunction, initial encounter (HCC) 08/30/2021   Mood disorder (HCC)  08/30/2021   Disorder of ligament, right ankle 08/03/2021   Pain in right ankle and joints of right foot 08/03/2021   Local infection due to central venous catheter, sequela 07/21/2021   Acquired absence of left leg below knee (HCC) 06/21/2021   S/P AKA (above knee amputation), left (HCC) 05/04/2021   Bacteremia 04/20/2021   Other specified joint disorders, left knee 04/20/2021   Iron deficiency anemia, unspecified 04/13/2021   Anemia of chronic disease    Infection of prosthetic left knee joint (HCC)    MRSA bacteremia 03/30/2021   Left knee pain    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    Fever 03/29/2021   End stage renal disease (HCC) 03/29/2021   COPD (chronic obstructive pulmonary disease) (HCC) 03/29/2021   Type 2 diabetes mellitus with hyperlipidemia (HCC) 03/29/2021   PTSD (post-traumatic stress disorder) 03/29/2021   Dyspnea, unspecified 10/12/2020   Pain in arm, unspecified 10/12/2020   Mild protein-calorie malnutrition (HCC) 09/06/2020   Status post cervical spinal fusion 08/31/2020   Controlled substance agreement signed 08/30/2020   Chronic pain 08/20/2020   Secondary hyperparathyroidism of renal origin (HCC) 08/15/2020   Allergy, unspecified, sequela 07/21/2020   Coagulation defect, unspecified (HCC) 07/21/2020   Encounter for immunization 07/21/2020   Personal history of anaphylaxis 07/21/2020   Cervical myelopathy (HCC) 07/03/2020   Bilateral bunions 05/08/2020   Hammer toes of both feet 05/08/2020   Onychomycosis of toenail 05/08/2020  Muscle cramps 05/07/2020   History of 2019 novel coronavirus disease (COVID-19) 11/06/2019   Cervical radiculopathy 10/28/2019   Volume overload 06/25/2019   Degenerative disc disease, cervical 04/02/2019   History of MRSA infection 10/04/2018   Spinal stenosis of lumbosacral region 08/28/2018   Primary osteoarthritis of both shoulders 02/12/2018   Rotator cuff arthropathy of both shoulders 02/12/2018    Lumbar degenerative disc disease 01/25/2018   Chronic midline low back pain without sciatica 12/15/2017   History of colon polyps 08/15/2016   Mild episode of recurrent major depressive disorder (HCC) 05/30/2016   Vitamin D deficiency 04/18/2016   Chronic insomnia 12/21/2015   Pulmonary hypertension, mild (HCC) 04/29/2014   Chronic gastritis without bleeding 01/13/2014   Diabetic polyneuropathy associated with type 2 diabetes mellitus (HCC) 01/13/2014   Chronic diastolic heart failure (HCC) 12/24/2013   OSA (obstructive sleep apnea) 12/24/2013   Primary osteoarthritis of both knees 08/26/2013   Primary osteoarthritis of right knee 08/26/2013   Status post total bilateral knee replacement 05/02/2013   Mixed hyperlipidemia 07/30/2012   Class 3 severe obesity due to excess calories with serious comorbidity and body mass index (BMI) of 45.0 to 49.9 in adult (HCC) 07/27/2012   Gastroesophageal reflux disease without esophagitis 05/12/2008   Erectile dysfunction due to diseases classified elsewhere 01/19/2005   Home Medication(s) Prior to Admission medications   Medication Sig Start Date End Date Taking? Authorizing Provider  acetaminophen (TYLENOL) 325 MG tablet Take 2 tablets (650 mg total) by mouth every 6 (six) hours. 07/23/23   Lockie Mola, MD  amLODipine (NORVASC) 10 MG tablet Take 10 mg by mouth every morning. 04/17/20   [provider]  ASPIRIN LOW DOSE 81 MG EC tablet Take 81 mg by mouth in the morning. 02/22/21   [provider]  atorvastatin (LIPITOR) 40 MG tablet Take 40 mg by mouth in the morning. 02/09/21   [provider]  calcium acetate (PHOSLO) 667 MG capsule Take 2 capsules (1,334 mg total) by mouth with breakfast, with lunch, and with evening meal. 04/10/21   Calvert Cantor, MD  Calcium Carbonate Antacid (CALCIUM CARBONATE, DOSED IN MG ELEMENTAL CALCIUM,) 1250 MG/5ML SUSP Take 5 mLs (500 mg of elemental calcium total) by mouth every 6 (six) hours as  needed for indigestion. 04/08/23   Rhetta Mura, MD  carvedilol (COREG) 12.5 MG tablet Take 12.5 mg by mouth 2 (two) times daily with a meal. 04/17/20   [provider]  Cholecalciferol (VITAMIN D) 50 MCG (2000 UT) CAPS Take 2,000 Units by mouth daily.    [provider]  clopidogrel (PLAVIX) 75 MG tablet Take 1 tablet (75 mg total) by mouth daily. 11/02/22   Rhyne, Ames Coupe, PA-C  cyclobenzaprine (FLEXERIL) 10 MG tablet Take 10 mg by mouth 3 (three) times daily as needed for muscle spasms. 12/29/21   [provider]  diazepam (VALIUM) 5 MG tablet Take 5 mg by mouth 2 (two) times daily as needed for muscle spasms. 12/09/21   [provider]  insulin regular human CONCENTRATED (HUMULIN R U-500 KWIKPEN) 500 UNIT/ML KwikPen Inject 45 Units into the skin daily with supper. Patient taking differently: Inject 70-125 Units into the skin 2 (two) times daily with a meal. 09/04/21 08/30/24  [name], MD  nortriptyline (PAMELOR) 10 MG capsule Take 20 mg by mouth at bedtime.    [provider]  oxyCODONE (OXY IR/ROXICODONE) 5 MG immediate release tablet Take 1 tablet (5 mg total) by mouth every 4 (four) hours  as needed for severe pain or moderate pain (please give patient scheduled tylenol BEFORE oxycodone). 07/23/23   Lockie Mola, MD  pantoprazole (PROTONIX) 40 MG tablet Take 40 mg by mouth daily before breakfast. 02/09/21   [provider]  pregabalin (LYRICA) 75 MG capsule Take 1 capsule (75 mg total) by mouth See admin instructions. Daily.  Give after dialysis on dialysis days. 01/21/23   [name], MD  sertraline (ZOLOFT) 100 MG tablet Take 100 mg by mouth in the morning. 02/22/21   [provider]  tamsulosin (FLOMAX) 0.4 MG CAPS capsule Take 1 capsule (0.4 mg total) by mouth daily. 11/10/21   [name], MD  torsemide (DEMADEX) 20 MG tablet Take 120 mg by mouth 2 (two) times daily.    [provider]  traZODone  (DESYREL) 150 MG tablet Take 150 mg by mouth at bedtime. 12/09/21   [provider]                                                                                                                                    Past Surgical History Past Surgical History:  Procedure Laterality Date   A/V FISTULAGRAM N/A 08/30/2021   Procedure: A/V FISTULAGRAM;  Surgeon: Maeola Harman, MD;  Location: [facility] INVASIVE CV LAB;  Service: Cardiovascular;  Laterality: N/A;   A/V FISTULAGRAM Right 07/28/2022   Procedure: A/V Fistulagram;  Surgeon: Maeola Harman, MD;  Location: [facility] INVASIVE CV LAB;  Service: Cardiovascular;  Laterality: Right;   A/V FISTULAGRAM Right 09/05/2022   Procedure: A/V Fistulagram;  Surgeon: Maeola Harman, MD;  Location: [facility] INVASIVE CV LAB;  Service: Cardiovascular;  Laterality: Right;   A/V FISTULAGRAM Right 10/31/2022   Procedure: A/V Fistulagram;  Surgeon: Maeola Harman, MD;  Location: [facility] INVASIVE CV LAB;  Service: Cardiovascular;  Laterality: Right;   A/V SHUNT INTERVENTION Right 10/31/2022   Procedure: A/V SHUNT INTERVENTION;  Surgeon: Maeola Harman, MD;  Location: [facility] INVASIVE CV LAB;  Service: Cardiovascular;  Laterality: Right;   A/V SHUNTOGRAM N/A 11/08/2021   Procedure: A/V SHUNTOGRAM;  Surgeon: Maeola Harman, MD;  Location: [facility] INVASIVE CV LAB;  Service: Cardiovascular;  Laterality: N/A;   AMPUTATION Left 04/03/2021   Procedure: LEFT ABOVE-THE-KNEE AMPUTATION;  Surgeon: Terance Hart, MD;  Location: [facility] OR;  Service: Orthopedics;  Laterality: Left;   AV FISTULA PLACEMENT Right 08/12/2021   Procedure: INSERTION OF ARTERIOVENOUS (AV) GORE-TEX GRAFT RIGHT ARM;  Surgeon: Nada Libman, MD;  Location: [facility] OR;  Service: Vascular;  Laterality: Right;   AV FISTULA PLACEMENT Left 04/07/2023   Procedure: ARTERIOVENOUS (AV) FISTULA GRAFT USING GORETEX STRETCH (4-7MM);  Surgeon: Chuck Hint, MD;  Location: [facility]  OR;  Service: Vascular;  Laterality: Left;   INSERTION OF DIALYSIS CATHETER N/A 11/25/2022   Procedure: INSERTION OF DIALYSIS CATHETER;  Surgeon: Lorin Glass, MD;  Location: [facility] ENDOSCOPY;  Service: Pulmonary;  Laterality: N/A;  INSERTION OF DIALYSIS CATHETER N/A 03/03/2023   Procedure: INSERTION OF TUNNELED DIALYSIS CATHETER;  Surgeon: Maeola Harman, MD;  Location: [facility] OR;  Service: Vascular;  Laterality: N/A;   INSERTION OF DIALYSIS CATHETER Left 04/07/2023   Procedure: INSERTION OF DIALYSIS CATHETER USING PALINDROME  28CM PRECISION CHRONIC CATHETER KIT;  Surgeon: Chuck Hint, MD;  Location: [facility] OR;  Service: Vascular;  Laterality: Left;   INSERTION OF DIALYSIS CATHETER Left 07/18/2023   Procedure: INSERTION OF TUNNELED DIALYSIS CATHETER USING 23cm PALINDROME CHRONIC CATHETER;  Surgeon: Chuck Hint, MD;  Location: [facility] OR;  Service: Vascular;  Laterality: Left;   IR REMOVAL TUN CV CATH W/O FL  03/30/2021   JOINT REPLACEMENT     Bilateral knees   LIGATION ARTERIOVENOUS GORTEX GRAFT Right 03/03/2023   Procedure: LIGATION RIGHT ARM ARTERIOVENOUS GORTEX GRAFT;  Surgeon: Maeola Harman, MD;  Location: [facility] OR;  Service: Vascular;  Laterality: Right;   PERIPHERAL VASCULAR BALLOON ANGIOPLASTY Right 08/30/2021   Procedure: PERIPHERAL VASCULAR BALLOON ANGIOPLASTY;  Surgeon: Maeola Harman, MD;  Location: [facility] INVASIVE CV LAB;  Service: Cardiovascular;  Laterality: Right;   PERIPHERAL VASCULAR BALLOON ANGIOPLASTY Right 07/28/2022   Procedure: PERIPHERAL VASCULAR BALLOON ANGIOPLASTY;  Surgeon: Maeola Harman, MD;  Location: [facility] INVASIVE CV LAB;  Service: Cardiovascular;  Laterality: Right;  AVF   PERIPHERAL VASCULAR INTERVENTION  11/08/2021   Procedure: PERIPHERAL VASCULAR INTERVENTION;  Surgeon: Maeola Harman, MD;  Location: [facility] INVASIVE CV LAB;  Service: Cardiovascular;;  Central rt arm fistula   UPPER EXTREMITY VENOGRAPHY Right 08/10/2021    Procedure: UPPER EXTREMITY VENOGRAPHY;  Surgeon: Nada Libman, MD;  Location: [facility] INVASIVE CV LAB;  Service: Cardiovascular;  Laterality: Right;   Family History No family history on file.  Social History Social History   Tobacco Use   Smoking status: Former    Current packs/day: 0.00    Types: Cigarettes    Quit date: 2012    Years since quitting: 12.6   Smokeless tobacco: Never  Vaping Use   Vaping status: Never Used  Substance Use Topics   Alcohol use: Yes    Comment: occasionally   Drug use: Not Currently    Comment: Years ago   Allergies Actos [pioglitazone], Dexmedetomidine, Ibuprofen, Tomato, and Wellbutrin [bupropion]  Review of Systems Review of Systems  All other systems reviewed and are negative.   Physical Exam Vital Signs  I have reviewed the triage vital signs BP (!) 154/72 (BP Location: Right Wrist)   Pulse 93   Temp 98.9 F (37.2 C)   Resp 18   SpO2 100%   Physical Exam Constitutional:      General: He is not in acute distress.    Appearance: Normal appearance.  HENT:     Head: Normocephalic and atraumatic.     Nose: No congestion or rhinorrhea.  Eyes:     General:        Right eye: No discharge.        Left eye: No discharge.     Extraocular Movements: Extraocular movements intact.     Pupils: Pupils are equal, round, and reactive to light.  Cardiovascular:     Rate and Rhythm: Normal rate and regular rhythm.     Heart sounds: No murmur heard. Pulmonary:     Effort: No respiratory distress.     Breath sounds: No wheezing or rales.  Abdominal:     General: There is no distension.     Tenderness: There is no  abdominal tenderness.  Musculoskeletal:        General: Normal range of motion.     Cervical back: Normal range of motion.  Skin:    General: Skin is warm and dry.  Neurological:     General: No focal deficit present.     Mental Status: He is alert.     ED Results and Treatments Labs (all labs ordered are listed, but  only abnormal results are displayed) Labs Reviewed  CBC WITH DIFFERENTIAL/PLATELET  BASIC METABOLIC PANEL                                                                                                                          Radiology No results found.  Pertinent labs & imaging results that were available during my care of the patient were reviewed by me and considered in my medical decision making (see MDM for details).  Medications Ordered in ED Medications  Chlorhexidine Gluconate Cloth 2 % PADS 6 each (has no administration in time range)  heparin sodium (porcine) 1000 UNIT/ML injection (has no administration in time range)                                                                                                                                     Procedures Procedures  (including critical care time)  Medical Decision Making / ED Course   This patient presents to the ED for concern of need for routine dialysis, this involves an extensive number of treatment options, and is a complaint that carries with it a high risk of complications and morbidity.  The differential diagnosis includes need for routine dialysis, electrolyte abnormality, anemia  MDM: Patient seen emergency room for evaluation of need for routine dialysis.  Physical exam unremarkable.  Laboratory evaluation with glucose 431, BUN 64, creatinine 5.02, leukocytosis 10.8, hemoglobin 9.5.  Patient taken for dialysis and will be discharged after completion of dialysis.   Additional history obtained:  -External records from outside source obtained and reviewed including: Chart review including previous notes, labs, imaging, consultation notes   Lab Tests: -I ordered, reviewed, and interpreted labs.   The pertinent results include:   Labs Reviewed  CBC WITH DIFFERENTIAL/PLATELET  BASIC METABOLIC PANEL      Medicines ordered and prescription drug management: Meds ordered this encounter  Medications    Chlorhexidine Gluconate Cloth 2 % PADS 6 each   heparin  sodium (porcine) 1000 UNIT/ML injection    Delrae Sawyers A: cabinet override    -I have reviewed the patients home medicines and have made adjustments as needed  Critical interventions none  Consultations Obtained: I requested consultation with the nephrologist on-call Dr. Arta Silence,  and discussed lab and imaging findings as well as pertinent plan - they recommend: Dialysis and discharge   Social Determinants of Health:  Factors impacting patients care include: none   Reevaluation: After the interventions noted above, I reevaluated the patient and found that they have :improved  Co morbidities that complicate the patient evaluation  Past Medical History:  Diagnosis Date   Altered mental status 05/04/2021   Arthritis    Blood transfusion without reported diagnosis    CHF (congestive heart failure) (HCC)    COPD (chronic obstructive pulmonary disease) (HCC)    Depression    Diabetes mellitus without complication (HCC)    type 2   Dyspnea    ESRD on hemodialysis (HCC)    Tues Thurs Sat   GERD (gastroesophageal reflux disease)    protonix   Heart murmur    as a child, no problems   Hypertension    Myocardial infarction (HCC)    PTSD (post-traumatic stress disorder)    Sepsis (HCC)    Sleep apnea    occasional uses CPAP   Uses powered wheelchair       Dispostion: I considered admission for this patient, and patient will likely be discharged after completion of routine dialysis     Final Clinical Impression(s) / ED Diagnoses Final diagnoses:  None     @PCDICTATION @    Glendora Score, MD 08/05/23 [phone]

## 2023-08-05 NOTE — ED Triage Notes (Signed)
Pt is here for dialysis, no new complaints reported this morning

## 2023-08-08 ENCOUNTER — Other Ambulatory Visit: Payer: Self-pay

## 2023-08-08 ENCOUNTER — Encounter (HOSPITAL_COMMUNITY): Payer: Self-pay | Admitting: Emergency Medicine

## 2023-08-08 ENCOUNTER — Emergency Department (HOSPITAL_COMMUNITY)
Admission: EM | Admit: 2023-08-08 | Discharge: 2023-08-08 | Disposition: A | Discharge status: Home / Self Care | Payer: 59 | Attending: Emergency Medicine | Admitting: Emergency Medicine

## 2023-08-08 DIAGNOSIS — Z794 Long term (current) use of insulin: Secondary | ICD-10-CM | POA: Diagnosis not present

## 2023-08-08 DIAGNOSIS — Z7982 Long term (current) use of aspirin: Secondary | ICD-10-CM | POA: Insufficient documentation

## 2023-08-08 DIAGNOSIS — Z992 Dependence on renal dialysis: Secondary | ICD-10-CM | POA: Insufficient documentation

## 2023-08-08 DIAGNOSIS — N186 End stage renal disease: Secondary | ICD-10-CM | POA: Diagnosis present

## 2023-08-08 LAB — CBC
HCT: 29 % — ABNORMAL LOW (ref 39.0–52.0)
Hemoglobin: 8.9 g/dL — ABNORMAL LOW (ref 13.0–17.0)
MCH: 26.6 pg (ref 26.0–34.0)
MCHC: 30.7 g/dL (ref 30.0–36.0)
MCV: 86.8 fL (ref 80.0–100.0)
Platelets: 80 10*3/uL — ABNORMAL LOW (ref 150–400)
RBC: 3.34 MIL/uL — ABNORMAL LOW (ref 4.22–5.81)
RDW: 16.5 % — ABNORMAL HIGH (ref 11.5–15.5)
WBC: 7.3 10*3/uL (ref 4.0–10.5)
nRBC: 0 % (ref 0.0–0.2)

## 2023-08-08 LAB — RENAL FUNCTION PANEL
Albumin: 3.4 g/dL — ABNORMAL LOW (ref 3.5–5.0)
Anion gap: 12 (ref 5–15)
BUN: 77 mg/dL — ABNORMAL HIGH (ref 8–23)
CO2: 26 mmol/L (ref 22–32)
Calcium: 7.6 mg/dL — ABNORMAL LOW (ref 8.9–10.3)
Chloride: 90 mmol/L — ABNORMAL LOW (ref 98–111)
Creatinine, Ser: 5.59 mg/dL — ABNORMAL HIGH (ref 0.61–1.24)
GFR, Estimated: 11 mL/min — ABNORMAL LOW (ref >60)
Glucose, Bld: 455 mg/dL — ABNORMAL HIGH (ref 70–99)
Phosphorus: 3.5 mg/dL (ref 2.5–4.6)
Potassium: 4.2 mmol/L (ref 3.5–5.1)
Sodium: 128 mmol/L — ABNORMAL LOW (ref 135–145)

## 2023-08-08 MED ORDER — PENTAFLUOROPROP-TETRAFLUOROETH EX AERO: 1.0000 | INHALATION_SPRAY | CUTANEOUS | Status: DC | PRN

## 2023-08-08 MED ORDER — ANTICOAGULANT SODIUM CITRATE 4% (200MG/5ML) IV SOLN: 5.0000 mL | Status: DC | PRN

## 2023-08-08 MED ORDER — ALTEPLASE 2 MG IJ SOLR: 2.0000 mg | Freq: Once | INTRAMUSCULAR | Status: DC | PRN

## 2023-08-08 MED ORDER — LIDOCAINE-PRILOCAINE 2.5-2.5 % EX CREA: 1.0000 | TOPICAL_CREAM | CUTANEOUS | Status: DC | PRN

## 2023-08-08 MED ORDER — CHLORHEXIDINE GLUCONATE CLOTH 2 % EX PADS: 6.0000 | MEDICATED_PAD | Freq: Every day | CUTANEOUS | Status: DC

## 2023-08-08 MED ORDER — HEPARIN SODIUM (PORCINE) 1000 UNIT/ML DIALYSIS: 1000.0000 [IU] | INTRAMUSCULAR | Status: DC | PRN

## 2023-08-08 MED ORDER — LIDOCAINE HCL (PF) 1 % IJ SOLN: 5.0000 mL | INTRAMUSCULAR | Status: DC | PRN

## 2023-08-08 MED ORDER — HEPARIN SODIUM (PORCINE) 1000 UNIT/ML DIALYSIS
2000.0000 [IU] | INTRAMUSCULAR | Status: DC | PRN
  Administered 2023-08-08: 3800 [IU] via INTRAVENOUS_CENTRAL
  Administered 2023-08-08: 2000 [IU] via INTRAVENOUS_CENTRAL
  Filled 2023-08-08 (×6): qty 2

## 2023-08-08 NOTE — ED Provider Notes (Addendum)
[place] EMERGENCY DEPARTMENT AT [facility] Provider Note   CSN: [id number] Arrival date & time: 08/08/23  4132     History  Chief Complaint  Patient presents with   Needs Dialysis     Jeffrey [name] is a 62 y.o. male history of ESRD on dialysis that dialysis Tuesday Thursday Saturday presented for dialysis.  Patient is here for routine dialysis and has not endorsed any chest pain, shortness of breath, new onset weakness, swelling, fevers, nausea/vomiting.   Home Medications Prior to Admission medications   Medication Sig Start Date End Date Taking? Authorizing Provider  acetaminophen (TYLENOL) 325 MG tablet Take 2 tablets (650 mg total) by mouth every 6 (six) hours. 07/23/23   Lockie Mola, MD  amLODipine (NORVASC) 10 MG tablet Take 10 mg by mouth every morning. 04/17/20   [provider]  ASPIRIN LOW DOSE 81 MG EC tablet Take 81 mg by mouth in the morning. 02/22/21   [provider]  atorvastatin (LIPITOR) 40 MG tablet Take 40 mg by mouth in the morning. 02/09/21   [provider]  calcium acetate (PHOSLO) 667 MG capsule Take 2 capsules (1,334 mg total) by mouth with breakfast, with lunch, and with evening meal. 04/10/21   Calvert Cantor, MD  Calcium Carbonate Antacid (CALCIUM CARBONATE, DOSED IN MG ELEMENTAL CALCIUM,) 1250 MG/5ML SUSP Take 5 mLs (500 mg of elemental calcium total) by mouth every 6 (six) hours as needed for indigestion. 04/08/23   Rhetta Mura, MD  carvedilol (COREG) 12.5 MG tablet Take 12.5 mg by mouth 2 (two) times daily with a meal. 04/17/20   [provider]  Cholecalciferol (VITAMIN D) 50 MCG (2000 UT) CAPS Take 2,000 Units by mouth daily.    [provider]  clopidogrel (PLAVIX) 75 MG tablet Take 1 tablet (75 mg total) by mouth daily. 11/02/22   Rhyne, Ames Coupe, PA-C  cyclobenzaprine (FLEXERIL) 10 MG tablet Take 10 mg by mouth 3 (three) times daily as needed for muscle spasms. 12/29/21   [provider]  diazepam (VALIUM) 5 MG tablet Take 5 mg by mouth 2 (two) times daily as needed for muscle spasms. 12/09/21   [provider]  insulin regular human CONCENTRATED (HUMULIN R U-500 KWIKPEN) 500 UNIT/ML KwikPen Inject 45 Units into the skin daily with supper. Patient taking differently: Inject 70-125 Units into the skin 2 (two) times daily with a meal. 09/04/21 08/30/24  [name], MD  nortriptyline (PAMELOR) 10 MG capsule Take 20 mg by mouth at bedtime.    [provider]  oxyCODONE (OXY IR/ROXICODONE) 5 MG immediate release tablet Take 1 tablet (5 mg total) by mouth every 4 (four) hours as needed for severe pain or moderate pain (please give patient scheduled tylenol BEFORE oxycodone). 07/23/23   Lockie Mola, MD  pantoprazole (PROTONIX) 40 MG tablet Take 40 mg by mouth daily before breakfast. 02/09/21   [provider]  pregabalin (LYRICA) 75 MG capsule Take 1 capsule (75 mg total) by mouth See admin instructions. Daily.  Give after dialysis on dialysis days. 01/21/23   [name], MD  sertraline (ZOLOFT) 100 MG tablet Take 100 mg by mouth in the morning. 02/22/21   [provider]  tamsulosin (FLOMAX) 0.4 MG CAPS capsule Take 1 capsule (0.4 mg total) by mouth daily. 11/10/21   [name], MD  torsemide (DEMADEX) 20 MG tablet Take 120 mg by mouth 2 (two) times daily.    [provider]  traZODone (DESYREL) 150  MG tablet Take 150 mg by mouth at bedtime. 12/09/21   [provider]      Allergies    Actos [pioglitazone], Dexmedetomidine, Ibuprofen, Tomato, and Wellbutrin [bupropion]    Review of Systems   Review of Systems  Physical Exam Updated Vital Signs BP (!) 164/63   Pulse 87   Temp 98 F (36.7 C)   Resp 15   Ht 5\' 5"  (1.651 m)   Wt 120.2 kg   SpO2 100%   BMI 44.10 kg/m  Physical Exam Constitutional:      General: He is not in acute distress.    Comments: Resting comfortably Does not appear fluid  overloaded  Cardiovascular:     Rate and Rhythm: Normal rate and regular rhythm.     Pulses: Normal pulses.  Pulmonary:     Effort: Pulmonary effort is normal. No respiratory distress.     Breath sounds: Normal breath sounds.     Comments: On 3 L however this is baseline Skin:    General: Skin is warm and dry.  Neurological:     Mental Status: He is alert.     ED Results / Procedures / Treatments   Labs (all labs ordered are listed, but only abnormal results are displayed) Labs Reviewed  HEPATITIS B SURFACE ANTIGEN  HEPATITIS B SURFACE ANTIBODY, QUANTITATIVE  RENAL FUNCTION PANEL  CBC    EKG None  Radiology No results found.  Procedures Procedures    Medications Ordered in ED Medications  Chlorhexidine Gluconate Cloth 2 % PADS 6 each (0 each Topical Hold 08/08/23 0837)  pentafluoroprop-tetrafluoroeth (GEBAUERS) aerosol 1 Application (has no administration in time range)  lidocaine (PF) (XYLOCAINE) 1 % injection 5 mL (has no administration in time range)  lidocaine-prilocaine (EMLA) cream 1 Application (has no administration in time range)  heparin injection 1,000 Units (has no administration in time range)  anticoagulant sodium citrate solution 5 mL (has no administration in time range)  alteplase (CATHFLO ACTIVASE) injection 2 mg (has no administration in time range)  heparin injection 2,000 Units (has no administration in time range)    ED Course/ Medical Decision Making/ A&P                                 Medical Decision Making Risk Decision regarding hospitalization.   [name] 62 y.o. presented today for dialysis. Working DDx that I considered at this time includes, but not limited to, electrolyte abnormalities, arrhythmias, emergent dialysis, dehydration, fluid overload, pleural effusions, uremic encephalopathy.  R/o DDx: electrolyte abnormalities, arrhythmias, emergent dialysis, dehydration, fluid overload, pleural effusions, uremic  encephalopathy: These are considered less likely due to history of present illness, physical exam, labs/imaging findings.  Review of prior external notes: 08/03/2023 ED provider  Unique Tests and My Interpretation:  None  Discussion with Independent Historian: None  Discussion of Management of Tests:  Marisue Humble, MD Nephrologist  Risk: Low: based on diagnostic testing/clinical impression and treatment plan  Risk Stratification Score: None  Plan: On exam patient was in no acute distress and resting comfortably with stable vitals.  Patient's exam was unremarkable and patient is not endorsing any chief concerns at this time as he is here for routine dialysis.  Nephrology was consulted and patient be taken for dialysis.  Patient will need to be reevaluated after dialysis but anticipate patient will be discharged home.        Final Clinical Impression(s) / ED  Diagnoses Final diagnoses:  ESRD (end stage renal disease) [facility])    Rx / DC Orders ED Discharge Orders     None         Jerritt, Oberg, PA-C 08/08/23 0847    Netta Corrigan, PA-C 08/08/23 1045    Tegeler, Canary Brim, MD 08/08/23 1521

## 2023-08-08 NOTE — ED Triage Notes (Signed)
Patient arrives in personal wheelchair for dialysis. Denies any other complaints.

## 2023-08-08 NOTE — Progress Notes (Addendum)
Received patient in bed.Awake ,alert and oriented x 4.  Access use. Patient agreed to used Left upper arm AVG,cannulated without difficulty,worked well during treatment.  Medicine given : Heparin 2000 units pre -run dose.  Duration of treatment:  2.47 hours.  Fluid removed : 1.9 liters  Hemo comment: Last 45 minutes of his treatment ,he called me and said," my arm started hurting, please stop my treatment"HD nurse offered to switch connection to HD catheter but patient refused.' I need time to graft /arm to heal."  Hand off to the patient's nurse.

## 2023-08-08 NOTE — Hospital Course (Signed)
I saw the patient and agree with the above assessment and plan.    Seen on HD. 2L UF goal >using AVG.  No c/o other than mild cramping.

## 2023-08-08 NOTE — Progress Notes (Signed)
[facility]  Jeffrey [name] presented to the ED today for dialysis.  Pt was discharged from his OP HD unit in December 2023 due to behavioral issues. He no longer has a home outpatient HD unit and has been receiving dialysis through [facility] ED. His last dialysis was 08/05/23. Patient reports he is feeling well. Denies SOB, CP, dizziness, abdominal pain, N/V/D. Noted some edema in his R leg, he reports it is always slightly swollen.    Last OP HD orders (dec 2023):  3:15h  129kg  RUE AVG   Heparin 2000    Physical exam: General: alert male in NAD HEENT: NCAT, sclera anicteric Cardiac: RRR, no murmurs, rubs or gallops Pulm: On O2 via [place], lungs CTA bilaterally GI: Abdomen soft, non-distended, +BS Extremities: 1+ edema RLE, L AKA Dialysis access: AVG +t/b  Assessment/Plan ESRD: Patient with no outpatient HD unit. Will receive HD here today then return to ED for eval and hopefully discharge. Anemia of CKD: Prior notes reviewed, Received course of IV iron in June-July. Has been receiving aranesp q 1-2 weeks. Appears it was last given 08/05/23 per notes. Hgb 9.5 today. Secondary hyperparathyroidism: Ca slightly low, phos controlled. PTH was at goal on 05/23/23, will recheck today.  HTN/volume: BP elevated, reeval post HD. patient with stable edema. UF goal 2L as tolerated, patient is very sensitive to cramping.   [name], PA-C 08/08/2023, 9:18 AM  [facility] Pager: [phone]

## 2023-08-09 LAB — PARATHYROID HORMONE, INTACT (NO CA): PTH: 211 pg/mL — ABNORMAL HIGH (ref 15–65)

## 2023-08-10 ENCOUNTER — Other Ambulatory Visit: Payer: Self-pay

## 2023-08-10 ENCOUNTER — Emergency Department (HOSPITAL_COMMUNITY)
Admission: EM | Admit: 2023-08-10 | Discharge: 2023-08-10 | Disposition: A | Discharge status: Home / Self Care | Payer: 59

## 2023-08-10 DIAGNOSIS — Y732 Prosthetic and other implants, materials and accessory gastroenterology and urology devices associated with adverse incidents: Secondary | ICD-10-CM | POA: Insufficient documentation

## 2023-08-10 DIAGNOSIS — Z79899 Other long term (current) drug therapy: Secondary | ICD-10-CM | POA: Insufficient documentation

## 2023-08-10 DIAGNOSIS — Z7982 Long term (current) use of aspirin: Secondary | ICD-10-CM | POA: Diagnosis not present

## 2023-08-10 DIAGNOSIS — T82598A Other mechanical complication of other cardiac and vascular devices and implants, initial encounter: Secondary | ICD-10-CM | POA: Diagnosis present

## 2023-08-10 DIAGNOSIS — Z794 Long term (current) use of insulin: Secondary | ICD-10-CM | POA: Insufficient documentation

## 2023-08-10 DIAGNOSIS — N186 End stage renal disease: Secondary | ICD-10-CM | POA: Diagnosis not present

## 2023-08-10 DIAGNOSIS — Z992 Dependence on renal dialysis: Secondary | ICD-10-CM

## 2023-08-10 LAB — RENAL FUNCTION PANEL
Albumin: 3.3 g/dL — ABNORMAL LOW (ref 3.5–5.0)
Anion gap: 15 (ref 5–15)
BUN: 69 mg/dL — ABNORMAL HIGH (ref 8–23)
CO2: 27 mmol/L (ref 22–32)
Calcium: 7.8 mg/dL — ABNORMAL LOW (ref 8.9–10.3)
Chloride: 94 mmol/L — ABNORMAL LOW (ref 98–111)
Creatinine, Ser: 5.21 mg/dL — ABNORMAL HIGH (ref 0.61–1.24)
GFR, Estimated: 12 mL/min — ABNORMAL LOW (ref >60)
Glucose, Bld: 203 mg/dL — ABNORMAL HIGH (ref 70–99)
Phosphorus: 4.1 mg/dL (ref 2.5–4.6)
Potassium: 3.8 mmol/L (ref 3.5–5.1)
Sodium: 136 mmol/L (ref 135–145)

## 2023-08-10 LAB — CBC
HCT: 24.1 % — ABNORMAL LOW (ref 39.0–52.0)
Hemoglobin: 7.2 g/dL — ABNORMAL LOW (ref 13.0–17.0)
MCH: 26.9 pg (ref 26.0–34.0)
MCHC: 29.9 g/dL — ABNORMAL LOW (ref 30.0–36.0)
MCV: 89.9 fL (ref 80.0–100.0)
Platelets: 73 10*3/uL — ABNORMAL LOW (ref 150–400)
RBC: 2.68 MIL/uL — ABNORMAL LOW (ref 4.22–5.81)
RDW: 17 % — ABNORMAL HIGH (ref 11.5–15.5)
WBC: 5.7 10*3/uL (ref 4.0–10.5)
nRBC: 0 % (ref 0.0–0.2)

## 2023-08-10 MED ORDER — ANTICOAGULANT SODIUM CITRATE 4% (200MG/5ML) IV SOLN: 5.0000 mL | Status: DC | PRN

## 2023-08-10 MED ORDER — HEPARIN SODIUM (PORCINE) 1000 UNIT/ML DIALYSIS: 1000.0000 [IU] | INTRAMUSCULAR | Status: DC | PRN

## 2023-08-10 MED ORDER — CHLORHEXIDINE GLUCONATE CLOTH 2 % EX PADS: 6.0000 | MEDICATED_PAD | Freq: Every day | CUTANEOUS | Status: DC

## 2023-08-10 MED ORDER — ALTEPLASE 2 MG IJ SOLR: 2.0000 mg | Freq: Once | INTRAMUSCULAR | Status: DC | PRN

## 2023-08-10 MED ORDER — LIDOCAINE HCL (PF) 1 % IJ SOLN: 5.0000 mL | INTRAMUSCULAR | Status: DC | PRN

## 2023-08-10 MED ORDER — HEPARIN SODIUM (PORCINE) 1000 UNIT/ML DIALYSIS
2000.0000 [IU] | Freq: Once | INTRAMUSCULAR | Status: AC
  Administered 2023-08-10: 2000 [IU] via INTRAVENOUS_CENTRAL
  Filled 2023-08-10: qty 2

## 2023-08-10 MED ORDER — LIDOCAINE-PRILOCAINE 2.5-2.5 % EX CREA: 1.0000 | TOPICAL_CREAM | CUTANEOUS | Status: DC | PRN

## 2023-08-10 MED ORDER — PENTAFLUOROPROP-TETRAFLUOROETH EX AERO: 1.0000 | INHALATION_SPRAY | CUTANEOUS | Status: DC | PRN

## 2023-08-10 MED ORDER — HEPARIN SODIUM (PORCINE) 1000 UNIT/ML IJ SOLN
3800.0000 [IU] | Freq: Once | INTRAMUSCULAR | Status: AC
  Administered 2023-08-10: 3800 [IU]
  Filled 2023-08-10: qty 4

## 2023-08-10 NOTE — ED Notes (Signed)
Pt came back to ED post dialysis. Pt refused to stay post dialysis for reassessment by provider. Pt seen leaving ED in wheelchair. Pt refused vital signs prior to leaving. Pt AAOx4. Pt in no acute distress upon leaving ED.

## 2023-08-10 NOTE — ED Provider Notes (Signed)
[place] EMERGENCY DEPARTMENT AT [facility] Provider Note   CSN: [id number] Arrival date & time: 08/10/23  0630     History  Chief Complaint  Patient presents with   Vascular Access Problem    Jeffrey [name] is a 62 y.o. male.  Jeffrey [name] is a 62 y.o. male with a history of ESRD on hemodialysis who presents to the emergency department for dialysis, he is on Tuesday, Thursday Saturday schedule and received full dialysis treatment on Tuesday.  Patient is currently receiving dialysis routinely through the emergency department/hospital.  He currently denies any symptoms he is at his baseline oxygen requirement has not noticed any chest pain, shortness of breath or increased swelling.  The history is provided by the patient and medical records.       Home Medications Prior to Admission medications   Medication Sig Start Date End Date Taking? Authorizing Provider  acetaminophen (TYLENOL) 325 MG tablet Take 2 tablets (650 mg total) by mouth every 6 (six) hours. 07/23/23   Lockie Mola, MD  amLODipine (NORVASC) 10 MG tablet Take 10 mg by mouth every morning. 04/17/20   [provider]  ASPIRIN LOW DOSE 81 MG EC tablet Take 81 mg by mouth in the morning. 02/22/21   [provider]  atorvastatin (LIPITOR) 40 MG tablet Take 40 mg by mouth in the morning. 02/09/21   [provider]  calcium acetate (PHOSLO) 667 MG capsule Take 2 capsules (1,334 mg total) by mouth with breakfast, with lunch, and with evening meal. 04/10/21   Calvert Cantor, MD  Calcium Carbonate Antacid (CALCIUM CARBONATE, DOSED IN MG ELEMENTAL CALCIUM,) 1250 MG/5ML SUSP Take 5 mLs (500 mg of elemental calcium total) by mouth every 6 (six) hours as needed for indigestion. 04/08/23   Rhetta Mura, MD  carvedilol (COREG) 12.5 MG tablet Take 12.5 mg by mouth 2 (two) times daily with a meal. 04/17/20   [provider]  Cholecalciferol (VITAMIN D) 50 MCG (2000 UT) CAPS Take 2,000  Units by mouth daily.    [provider]  clopidogrel (PLAVIX) 75 MG tablet Take 1 tablet (75 mg total) by mouth daily. 11/02/22   Rhyne, Ames Coupe, PA-C  cyclobenzaprine (FLEXERIL) 10 MG tablet Take 10 mg by mouth 3 (three) times daily as needed for muscle spasms. 12/29/21   [provider]  diazepam (VALIUM) 5 MG tablet Take 5 mg by mouth 2 (two) times daily as needed for muscle spasms. 12/09/21   [provider]  insulin regular human CONCENTRATED (HUMULIN R U-500 KWIKPEN) 500 UNIT/ML KwikPen Inject 45 Units into the skin daily with supper. Patient taking differently: Inject 70-125 Units into the skin 2 (two) times daily with a meal. 09/04/21 08/30/24  [name], MD  nortriptyline (PAMELOR) 10 MG capsule Take 20 mg by mouth at bedtime.    [provider]  oxyCODONE (OXY IR/ROXICODONE) 5 MG immediate release tablet Take 1 tablet (5 mg total) by mouth every 4 (four) hours as needed for severe pain or moderate pain (please give patient scheduled tylenol BEFORE oxycodone). 07/23/23   Lockie Mola, MD  pantoprazole (PROTONIX) 40 MG tablet Take 40 mg by mouth daily before breakfast. 02/09/21   [provider]  pregabalin (LYRICA) 75 MG capsule Take 1 capsule (75 mg total) by mouth See admin instructions. Daily.  Give after dialysis on dialysis days. 01/21/23   [name], MD  sertraline (ZOLOFT) 100 MG tablet Take 100 mg by mouth in the morning.  02/22/21   [provider]  tamsulosin (FLOMAX) 0.4 MG CAPS capsule Take 1 capsule (0.4 mg total) by mouth daily. 11/10/21   [name], MD  torsemide (DEMADEX) 20 MG tablet Take 120 mg by mouth 2 (two) times daily.    [provider]  traZODone (DESYREL) 150 MG tablet Take 150 mg by mouth at bedtime. 12/09/21   [provider]      Allergies    Actos [pioglitazone], Dexmedetomidine, Ibuprofen, Tomato, and Wellbutrin [bupropion]    Review of Systems   Review of Systems   Constitutional:  Negative for chills and fever.  Respiratory:  Negative for shortness of breath.   Cardiovascular:  Negative for chest pain.    Physical Exam Updated Vital Signs BP (!) 127/35   Pulse 89   Temp 98.7 F (37.1 C) (Oral)   Resp 18   SpO2 97%  Physical Exam Vitals and nursing note reviewed.  Constitutional:      General: He is not in acute distress.    Appearance: Normal appearance. He is well-developed. He is not ill-appearing or diaphoretic.  HENT:     Head: Normocephalic and atraumatic.  Eyes:     General:        Right eye: No discharge.        Left eye: No discharge.  Cardiovascular:     Rate and Rhythm: Normal rate and regular rhythm.  Pulmonary:     Effort: Pulmonary effort is normal. No respiratory distress.     Comments: On 3 L nasal cannula, normal respiratory effort breath sounds slightly diminished but clear Neurological:     Mental Status: He is alert and oriented to person, place, and time.     Coordination: Coordination normal.  Psychiatric:        Mood and Affect: Mood normal.        Behavior: Behavior normal.     ED Results / Procedures / Treatments   Labs (all labs ordered are listed, but only abnormal results are displayed) Labs Reviewed  RENAL FUNCTION PANEL  CBC    EKG None  Radiology No results found.  Procedures Procedures    Medications Ordered in ED Medications  Chlorhexidine Gluconate Cloth 2 % PADS 6 each (6 each Topical Not Given 08/10/23 0824)  pentafluoroprop-tetrafluoroeth (GEBAUERS) aerosol 1 Application (has no administration in time range)  lidocaine (PF) (XYLOCAINE) 1 % injection 5 mL (has no administration in time range)  lidocaine-prilocaine (EMLA) cream 1 Application (has no administration in time range)  heparin injection 1,000 Units (has no administration in time range)  anticoagulant sodium citrate solution 5 mL (has no administration in time range)  alteplase (CATHFLO ACTIVASE) injection 2 mg (has no  administration in time range)  heparin injection 2,000 Units (has no administration in time range)    ED Course/ Medical Decision Making/ A&P                                 Medical Decision Making  62 year old male presents for routine dialysis, no acute symptoms at this time, vital stable patient at his baseline oxygen requirement.  Case discussed with Dr. Marisue Humble with nephrology and they will arrange for the patient to be dialyzed, plan is for discharge after patient returns from dialysis.        Final Clinical Impression(s) / ED Diagnoses Final diagnoses:  ESRD on dialysis [facility])    Rx / DC Orders ED  Discharge Orders     None         Dartha Lodge, [place] 08/10/23 1610    Durwin Glaze, MD 08/10/23 [phone]

## 2023-08-10 NOTE — ED Triage Notes (Signed)
Pt. Here for dialysis , no complaints. Full treatment on Tuesday.

## 2023-08-10 NOTE — Progress Notes (Signed)
Received patient in bed. Awake,alert and oriented x 4. Consent verified.  Access used: Left HD Catheter,arterial access has tight pull.reverse connection done,worked well.Dressing on date. Refused to use left upper arm AVG.  Medicine given. Heparin 2,000 units.  Duration of treatment: 3.25 instead of 3.5 hours.  Fluid removed : 3 liters.  Hemo comment: Tolerated treatment:Patient requested to shortened his treatment by 15 minutes.Refused to used his AVG,instead opted to use his HD catheter.[facility] made aware.  Hand off to the patient's nurse.

## 2023-08-12 ENCOUNTER — Emergency Department (HOSPITAL_COMMUNITY)
Admission: EM | Admit: 2023-08-12 | Discharge: 2023-08-12 | Discharge status: Against Medical Advice | Payer: 59 | Attending: Emergency Medicine | Admitting: Emergency Medicine

## 2023-08-12 DIAGNOSIS — Z79899 Other long term (current) drug therapy: Secondary | ICD-10-CM | POA: Insufficient documentation

## 2023-08-12 DIAGNOSIS — Z7982 Long term (current) use of aspirin: Secondary | ICD-10-CM | POA: Insufficient documentation

## 2023-08-12 DIAGNOSIS — Z992 Dependence on renal dialysis: Secondary | ICD-10-CM | POA: Diagnosis not present

## 2023-08-12 DIAGNOSIS — E1122 Type 2 diabetes mellitus with diabetic chronic kidney disease: Secondary | ICD-10-CM | POA: Insufficient documentation

## 2023-08-12 DIAGNOSIS — Z794 Long term (current) use of insulin: Secondary | ICD-10-CM | POA: Diagnosis not present

## 2023-08-12 DIAGNOSIS — I120 Hypertensive chronic kidney disease with stage 5 chronic kidney disease or end stage renal disease: Secondary | ICD-10-CM | POA: Insufficient documentation

## 2023-08-12 DIAGNOSIS — Z7902 Long term (current) use of antithrombotics/antiplatelets: Secondary | ICD-10-CM | POA: Diagnosis not present

## 2023-08-12 DIAGNOSIS — N186 End stage renal disease: Secondary | ICD-10-CM | POA: Insufficient documentation

## 2023-08-12 DIAGNOSIS — Z4931 Encounter for adequacy testing for hemodialysis: Secondary | ICD-10-CM | POA: Diagnosis present

## 2023-08-12 LAB — COMPREHENSIVE METABOLIC PANEL
ALT: 12 U/L (ref 0–44)
AST: 13 U/L — ABNORMAL LOW (ref 15–41)
Albumin: 3.7 g/dL (ref 3.5–5.0)
Alkaline Phosphatase: 103 U/L (ref 38–126)
Anion gap: 14 (ref 5–15)
BUN: 61 mg/dL — ABNORMAL HIGH (ref 8–23)
CO2: 26 mmol/L (ref 22–32)
Calcium: 8 mg/dL — ABNORMAL LOW (ref 8.9–10.3)
Chloride: 93 mmol/L — ABNORMAL LOW (ref 98–111)
Creatinine, Ser: 5.21 mg/dL — ABNORMAL HIGH (ref 0.61–1.24)
GFR, Estimated: 12 mL/min — ABNORMAL LOW (ref >60)
Glucose, Bld: 285 mg/dL — ABNORMAL HIGH (ref 70–99)
Potassium: 3.8 mmol/L (ref 3.5–5.1)
Sodium: 133 mmol/L — ABNORMAL LOW (ref 135–145)
Total Bilirubin: 0.4 mg/dL (ref 0.3–1.2)
Total Protein: 7.1 g/dL (ref 6.5–8.1)

## 2023-08-12 LAB — CBC WITH DIFFERENTIAL/PLATELET
Abs Immature Granulocytes: 0.04 10*3/uL (ref 0.00–0.07)
Basophils Absolute: 0 10*3/uL (ref 0.0–0.1)
Basophils Relative: 0 %
Eosinophils Absolute: 0.6 10*3/uL — ABNORMAL HIGH (ref 0.0–0.5)
Eosinophils Relative: 11 %
HCT: 31.8 % — ABNORMAL LOW (ref 39.0–52.0)
Hemoglobin: 9.9 g/dL — ABNORMAL LOW (ref 13.0–17.0)
Immature Granulocytes: 1 %
Lymphocytes Relative: 11 %
Lymphs Abs: 0.7 10*3/uL (ref 0.7–4.0)
MCH: 28 pg (ref 26.0–34.0)
MCHC: 31.1 g/dL (ref 30.0–36.0)
MCV: 90.1 fL (ref 80.0–100.0)
Monocytes Absolute: 0.4 10*3/uL (ref 0.1–1.0)
Monocytes Relative: 8 %
Neutro Abs: 3.9 10*3/uL (ref 1.7–7.7)
Neutrophils Relative %: 69 %
Platelets: 88 10*3/uL — ABNORMAL LOW (ref 150–400)
RBC: 3.53 MIL/uL — ABNORMAL LOW (ref 4.22–5.81)
RDW: 17.6 % — ABNORMAL HIGH (ref 11.5–15.5)
WBC: 5.7 10*3/uL (ref 4.0–10.5)
nRBC: 0 % (ref 0.0–0.2)

## 2023-08-12 LAB — PHOSPHORUS: Phosphorus: 4.6 mg/dL (ref 2.5–4.6)

## 2023-08-12 LAB — HEPATITIS B SURFACE ANTIGEN: Hepatitis B Surface Ag: NONREACTIVE

## 2023-08-12 MED ORDER — LIDOCAINE-PRILOCAINE 2.5-2.5 % EX CREA: 1.0000 | TOPICAL_CREAM | CUTANEOUS | Status: DC | PRN

## 2023-08-12 MED ORDER — HEPARIN SODIUM (PORCINE) 1000 UNIT/ML DIALYSIS: 1000.0000 [IU] | INTRAMUSCULAR | Status: DC | PRN

## 2023-08-12 MED ORDER — ALTEPLASE 2 MG IJ SOLR: 2.0000 mg | Freq: Once | INTRAMUSCULAR | Status: DC | PRN

## 2023-08-12 MED ORDER — CHLORHEXIDINE GLUCONATE CLOTH 2 % EX PADS: 6.0000 | MEDICATED_PAD | Freq: Every day | CUTANEOUS | Status: DC

## 2023-08-12 MED ORDER — HEPARIN SODIUM (PORCINE) 1000 UNIT/ML DIALYSIS
20.0000 [IU]/kg | INTRAMUSCULAR | Status: DC | PRN
  Administered 2023-08-12: 2600 [IU] via INTRAVENOUS_CENTRAL
  Filled 2023-08-12 (×2): qty 3

## 2023-08-12 MED ORDER — PENTAFLUOROPROP-TETRAFLUOROETH EX AERO
1.0000 | INHALATION_SPRAY | CUTANEOUS | Status: DC | PRN
  Filled 2023-08-12: qty 116

## 2023-08-12 MED ORDER — HEPARIN SODIUM (PORCINE) 1000 UNIT/ML IJ SOLN
4200.0000 [IU] | Freq: Once | INTRAMUSCULAR | Status: AC
  Administered 2023-08-12: 4200 [IU]
  Filled 2023-08-12: qty 5
  Filled 2023-08-12: qty 4.2

## 2023-08-12 MED ORDER — LIDOCAINE HCL (PF) 1 % IJ SOLN: 5.0000 mL | INTRAMUSCULAR | Status: DC | PRN

## 2023-08-12 MED ORDER — ANTICOAGULANT SODIUM CITRATE 4% (200MG/5ML) IV SOLN
5.0000 mL | Status: DC | PRN
  Filled 2023-08-12: qty 5

## 2023-08-12 NOTE — Procedures (Signed)
Patient was seen on dialysis and the procedure was supervised.  BFR 400  Via TDC BP is  138/62.   Patient appears to be tolerating treatment well. Refusing to use AVG now, reports he wants to rest his AVG.  D/w HD nurses.  Chicquita Mendel Jaynie Collins 08/12/2023

## 2023-08-12 NOTE — Progress Notes (Signed)
Pt is using his HD catheter due him stating that he felt tingling when he used it last and stil unable to move his hand on his access arm.

## 2023-08-12 NOTE — ED Provider Notes (Signed)
[place] EMERGENCY DEPARTMENT AT [facility] Provider Note   CSN: [id number] Arrival date & time: 08/12/23  7829     History  Chief Complaint  Patient presents with   Hemodialysis    Jeffrey [name] is a 62 y.o. male.  Is a 62 year old male with a past medical history of ESRD on dialysis (TTS) hypertension and diabetes who presents to the ED requesting dialysis.  He returns today for his scheduled dialysis session which was arranged through the emergency department and nephrology here.  He has no systemic complaints at this time.  No chest pain, shortness of breath, fevers or chills.  HPI     Home Medications Prior to Admission medications   Medication Sig Start Date End Date Taking? Authorizing Provider  acetaminophen (TYLENOL) 325 MG tablet Take 2 tablets (650 mg total) by mouth every 6 (six) hours. 07/23/23   Lockie Mola, MD  amLODipine (NORVASC) 10 MG tablet Take 10 mg by mouth every morning. 04/17/20   [provider]  ASPIRIN LOW DOSE 81 MG EC tablet Take 81 mg by mouth in the morning. 02/22/21   [provider]  atorvastatin (LIPITOR) 40 MG tablet Take 40 mg by mouth in the morning. 02/09/21   [provider]  calcium acetate (PHOSLO) 667 MG capsule Take 2 capsules (1,334 mg total) by mouth with breakfast, with lunch, and with evening meal. 04/10/21   Calvert Cantor, MD  Calcium Carbonate Antacid (CALCIUM CARBONATE, DOSED IN MG ELEMENTAL CALCIUM,) 1250 MG/5ML SUSP Take 5 mLs (500 mg of elemental calcium total) by mouth every 6 (six) hours as needed for indigestion. 04/08/23   Rhetta Mura, MD  carvedilol (COREG) 12.5 MG tablet Take 12.5 mg by mouth 2 (two) times daily with a meal. 04/17/20   [provider]  Cholecalciferol (VITAMIN D) 50 MCG (2000 UT) CAPS Take 2,000 Units by mouth daily.    [provider]  clopidogrel (PLAVIX) 75 MG tablet Take 1 tablet (75 mg total) by mouth daily. 11/02/22   Rhyne, Ames Coupe, PA-C   cyclobenzaprine (FLEXERIL) 10 MG tablet Take 10 mg by mouth 3 (three) times daily as needed for muscle spasms. 12/29/21   [provider]  diazepam (VALIUM) 5 MG tablet Take 5 mg by mouth 2 (two) times daily as needed for muscle spasms. 12/09/21   [provider]  insulin regular human CONCENTRATED (HUMULIN R U-500 KWIKPEN) 500 UNIT/ML KwikPen Inject 45 Units into the skin daily with supper. Patient taking differently: Inject 70-125 Units into the skin 2 (two) times daily with a meal. 09/04/21 08/30/24  [name], MD  nortriptyline (PAMELOR) 10 MG capsule Take 20 mg by mouth at bedtime.    [provider]  oxyCODONE (OXY IR/ROXICODONE) 5 MG immediate release tablet Take 1 tablet (5 mg total) by mouth every 4 (four) hours as needed for severe pain or moderate pain (please give patient scheduled tylenol BEFORE oxycodone). 07/23/23   Lockie Mola, MD  pantoprazole (PROTONIX) 40 MG tablet Take 40 mg by mouth daily before breakfast. 02/09/21   [provider]  pregabalin (LYRICA) 75 MG capsule Take 1 capsule (75 mg total) by mouth See admin instructions. Daily.  Give after dialysis on dialysis days. 01/21/23   [name], MD  sertraline (ZOLOFT) 100 MG tablet Take 100 mg by mouth in the morning. 02/22/21   [provider]  tamsulosin (FLOMAX) 0.4 MG CAPS capsule Take 1 capsule (0.4 mg total) by mouth daily. 11/10/21  [name], MD  torsemide (DEMADEX) 20 MG tablet Take 120 mg by mouth 2 (two) times daily.    [provider]  traZODone (DESYREL) 150 MG tablet Take 150 mg by mouth at bedtime. 12/09/21   [provider]      Allergies    Actos [pioglitazone], Dexmedetomidine, Ibuprofen, Tomato, and Wellbutrin [bupropion]    Review of Systems   Review of Systems  Constitutional:  Negative for chills and fever.  Genitourinary:  Negative for dysuria.  All other systems reviewed and are negative.   Physical Exam Updated Vital  Signs BP 138/62 (BP Location: Right Arm)   Pulse 83   Temp 97.9 F (36.6 C)   Resp 16   SpO2 100%  Physical Exam Vitals and nursing note reviewed.  HENT:     Head: Normocephalic and atraumatic.  Cardiovascular:     Rate and Rhythm: Normal rate and regular rhythm.  Pulmonary:     Effort: Pulmonary effort is normal.  Abdominal:     General: There is no distension.  Neurological:     Mental Status: He is alert.  Psychiatric:        Mood and Affect: Mood normal.     ED Results / Procedures / Treatments   Labs (all labs ordered are listed, but only abnormal results are displayed) Labs Reviewed  COMPREHENSIVE METABOLIC PANEL - Abnormal; Notable for the following components:      Result Value   Sodium 133 (*)    Chloride 93 (*)    Glucose, Bld 285 (*)    BUN 61 (*)    Creatinine, Ser 5.21 (*)    Calcium 8.0 (*)    AST 13 (*)    GFR, Estimated 12 (*)    All other components within normal limits  CBC WITH DIFFERENTIAL/PLATELET - Abnormal; Notable for the following components:   RBC 3.53 (*)    Hemoglobin 9.9 (*)    HCT 31.8 (*)    RDW 17.6 (*)    Platelets 88 (*)    Eosinophils Absolute 0.6 (*)    All other components within normal limits  PHOSPHORUS  HEPATITIS B SURFACE ANTIGEN  HEPATITIS B SURFACE ANTIBODY, QUANTITATIVE  RENAL FUNCTION PANEL  CBC    EKG None  Radiology No results found.  Procedures Procedures    Medications Ordered in ED Medications  Chlorhexidine Gluconate Cloth 2 % PADS 6 each (has no administration in time range)  pentafluoroprop-tetrafluoroeth (GEBAUERS) aerosol 1 Application (has no administration in time range)  lidocaine (PF) (XYLOCAINE) 1 % injection 5 mL (has no administration in time range)  lidocaine-prilocaine (EMLA) cream 1 Application (has no administration in time range)  heparin injection 1,000 Units (has no administration in time range)  anticoagulant sodium citrate solution 5 mL (has no administration in time range)   alteplase (CATHFLO ACTIVASE) injection 2 mg (has no administration in time range)  heparin injection 2,600 Units (has no administration in time range)    ED Course/ Medical Decision Making/ A&P                                 Medical Decision Making 62 year old male returns to the emergency department for his scheduled dialysis session.  No acute complaints.  Benign physical exam.  Vital signs within normal limits.  Discussed with Dr. Ronalee Belts (nephrology) who will arrange for dialysis today.  As long as there are no acute issues he will  be discharged in the ED following his dialysis session  Amount and/or Complexity of Data Reviewed Labs: ordered.           Final Clinical Impression(s) / ED Diagnoses Final diagnoses:  ESRD (end stage renal disease) (HCC)    Rx / DC Orders ED Discharge Orders     None         Royanne Foots, DO 08/12/23 1026

## 2023-08-12 NOTE — Discharge Instructions (Signed)
You were seen in the emergency department for dialysis You completed your dialysis appointment as scheduled Please return to the emergency department for your next dialysis on Tuesday Return sooner for any other concerns

## 2023-08-12 NOTE — ED Triage Notes (Signed)
Patient is here for his routine hemodialysis treatment ( Tues/Thurs/Sat).

## 2023-08-12 NOTE — Progress Notes (Addendum)
Received patient in bed to unit.  Alert and oriented.  Informed consent signed and in chart.   TX duration:3 hours and 20 min, patient terminated 10 minutes early and refused to sign AMA paperwork.  Dr. Ronalee Belts made aware.  Patient tolerated well.  Transported back to the room  Alert, without acute distress.  Hand-off given to patient's nurse.   Access used: L HD Cath Access issues: none  Total UF removed: Medication(s) given: none   08/12/23 1530  Vitals  Temp 98 F (36.7 C)  Temp Source Oral  BP (!) 141/86  Pulse Rate 89  ECG Heart Rate 89  Resp 17  MEWS COLOR  MEWS Score Color Green  Oxygen Therapy  SpO2 99 %  O2 Device Nasal Cannula  O2 Flow Rate (L/min) 3 L/min  MEWS Score  MEWS Temp 0  MEWS Systolic 0  MEWS Pulse 0  MEWS RR 0  MEWS LOC 0  MEWS Score 0     Stacie Glaze LPN [facility]

## 2023-08-15 ENCOUNTER — Emergency Department (HOSPITAL_COMMUNITY)
Admission: EM | Admit: 2023-08-15 | Discharge: 2023-08-15 | Discharge status: Against Medical Advice | Payer: 59 | Attending: Emergency Medicine | Admitting: Emergency Medicine

## 2023-08-15 ENCOUNTER — Emergency Department (HOSPITAL_COMMUNITY): Payer: 59

## 2023-08-15 DIAGNOSIS — Y92410 Unspecified street and highway as the place of occurrence of the external cause: Secondary | ICD-10-CM | POA: Insufficient documentation

## 2023-08-15 DIAGNOSIS — Z7901 Long term (current) use of anticoagulants: Secondary | ICD-10-CM | POA: Diagnosis not present

## 2023-08-15 DIAGNOSIS — M79631 Pain in right forearm: Secondary | ICD-10-CM | POA: Diagnosis present

## 2023-08-15 DIAGNOSIS — Z7982 Long term (current) use of aspirin: Secondary | ICD-10-CM | POA: Diagnosis not present

## 2023-08-15 DIAGNOSIS — Z992 Dependence on renal dialysis: Secondary | ICD-10-CM | POA: Diagnosis not present

## 2023-08-15 DIAGNOSIS — N186 End stage renal disease: Secondary | ICD-10-CM | POA: Insufficient documentation

## 2023-08-15 LAB — CBC WITH DIFFERENTIAL/PLATELET
Abs Immature Granulocytes: 0.03 10*3/uL (ref 0.00–0.07)
Basophils Absolute: 0 10*3/uL (ref 0.0–0.1)
Basophils Relative: 0 %
Eosinophils Absolute: 0.4 10*3/uL (ref 0.0–0.5)
Eosinophils Relative: 6 %
HCT: 30.5 % — ABNORMAL LOW (ref 39.0–52.0)
Hemoglobin: 9.7 g/dL — ABNORMAL LOW (ref 13.0–17.0)
Immature Granulocytes: 1 %
Lymphocytes Relative: 11 %
Lymphs Abs: 0.7 10*3/uL (ref 0.7–4.0)
MCH: 27.8 pg (ref 26.0–34.0)
MCHC: 31.8 g/dL (ref 30.0–36.0)
MCV: 87.4 fL (ref 80.0–100.0)
Monocytes Absolute: 0.5 10*3/uL (ref 0.1–1.0)
Monocytes Relative: 8 %
Neutro Abs: 4.8 10*3/uL (ref 1.7–7.7)
Neutrophils Relative %: 74 %
Platelets: 107 10*3/uL — ABNORMAL LOW (ref 150–400)
RBC: 3.49 MIL/uL — ABNORMAL LOW (ref 4.22–5.81)
RDW: 17.1 % — ABNORMAL HIGH (ref 11.5–15.5)
WBC: 6.4 10*3/uL (ref 4.0–10.5)
nRBC: 0 % (ref 0.0–0.2)

## 2023-08-15 LAB — IRON AND TIBC
Iron: 36 ug/dL — ABNORMAL LOW (ref 45–182)
Saturation Ratios: 15 % — ABNORMAL LOW (ref 17.9–39.5)
TIBC: 245 ug/dL — ABNORMAL LOW (ref 250–450)
UIBC: 209 ug/dL

## 2023-08-15 LAB — BASIC METABOLIC PANEL
Anion gap: 15 (ref 5–15)
BUN: 71 mg/dL — ABNORMAL HIGH (ref 8–23)
CO2: 26 mmol/L (ref 22–32)
Calcium: 8.3 mg/dL — ABNORMAL LOW (ref 8.9–10.3)
Chloride: 90 mmol/L — ABNORMAL LOW (ref 98–111)
Creatinine, Ser: 5.82 mg/dL — ABNORMAL HIGH (ref 0.61–1.24)
GFR, Estimated: 10 mL/min — ABNORMAL LOW (ref >60)
Glucose, Bld: 232 mg/dL — ABNORMAL HIGH (ref 70–99)
Potassium: 3.5 mmol/L (ref 3.5–5.1)
Sodium: 131 mmol/L — ABNORMAL LOW (ref 135–145)

## 2023-08-15 LAB — HEPATITIS B SURFACE ANTIBODY, QUANTITATIVE: Hep B S AB Quant (Post): 48.4 m[IU]/mL

## 2023-08-15 MED ORDER — PENTAFLUOROPROP-TETRAFLUOROETH EX AERO: 1.0000 | INHALATION_SPRAY | CUTANEOUS | Status: DC | PRN

## 2023-08-15 MED ORDER — ALTEPLASE 2 MG IJ SOLR: 2.0000 mg | Freq: Once | INTRAMUSCULAR | Status: DC | PRN

## 2023-08-15 MED ORDER — NEPRO/CARBSTEADY PO LIQD: 237.0000 mL | ORAL | Status: DC | PRN

## 2023-08-15 MED ORDER — ANTICOAGULANT SODIUM CITRATE 4% (200MG/5ML) IV SOLN: 5.0000 mL | Status: DC | PRN

## 2023-08-15 MED ORDER — HEPARIN SODIUM (PORCINE) 1000 UNIT/ML IJ SOLN
4200.0000 [IU] | Freq: Once | INTRAMUSCULAR | Status: DC
  Filled 2023-08-15: qty 5
  Filled 2023-08-15: qty 4.2

## 2023-08-15 MED ORDER — ACETAMINOPHEN 325 MG PO TABS
650.0000 mg | ORAL_TABLET | Freq: Once | ORAL | Status: AC
  Administered 2023-08-15: 650 mg via ORAL
  Filled 2023-08-15: qty 2

## 2023-08-15 MED ORDER — LIDOCAINE-PRILOCAINE 2.5-2.5 % EX CREA: 1.0000 | TOPICAL_CREAM | CUTANEOUS | Status: DC | PRN

## 2023-08-15 MED ORDER — HEPARIN SODIUM (PORCINE) 1000 UNIT/ML DIALYSIS
1000.0000 [IU] | INTRAMUSCULAR | Status: DC | PRN
  Filled 2023-08-15 (×2): qty 1

## 2023-08-15 MED ORDER — HYDROMORPHONE HCL 2 MG PO TABS
2.0000 mg | ORAL_TABLET | Freq: Once | ORAL | Status: AC
  Administered 2023-08-15: 2 mg via ORAL
  Filled 2023-08-15: qty 1

## 2023-08-15 MED ORDER — HEPARIN SODIUM (PORCINE) 1000 UNIT/ML DIALYSIS
2000.0000 [IU] | Freq: Once | INTRAMUSCULAR | Status: AC
  Administered 2023-08-15: 2000 [IU] via INTRAVENOUS_CENTRAL

## 2023-08-15 MED ORDER — CHLORHEXIDINE GLUCONATE CLOTH 2 % EX PADS: 6.0000 | MEDICATED_PAD | Freq: Every day | CUTANEOUS | Status: DC

## 2023-08-15 MED ORDER — LIDOCAINE HCL (PF) 1 % IJ SOLN: 5.0000 mL | INTRAMUSCULAR | Status: DC | PRN

## 2023-08-15 NOTE — Progress Notes (Addendum)
Received patient in bed to unit.  Alert and oriented.  Informed consent signed and in chart.   TX duration:3.5 hours  Patient tolerated well.  Transported back to the room  Alert, without acute distress.  Hand-off given to patient's nurse.   Access used: Left Venous HD Cath and Left Arterial Graft Access issues: At the beginning of treatment, Venous access at Graft was pulled due to high venous pressure.  Total UF removed: Medication(s) given: Tylenol and Dilaudid, see MAR   08/15/23 1501  Vitals  Temp 98.3 F (36.8 C)  Temp Source Oral  BP (!) 162/98  MAP (mmHg) 116  Pulse Rate 91  ECG Heart Rate 93  Resp 15  MEWS COLOR  MEWS Score Color Green  Oxygen Therapy  SpO2 99 %  O2 Device Nasal Cannula  O2 Flow Rate (L/min) 3 L/min  Pain Assessment  Pain Scale 0-10  Pain Score 7  Pain Type Acute pain  Pain Location Leg  Pain Orientation Left  Pain Descriptors / Indicators Constant  Pain Onset On-going  Pain Intervention(s) Refused  MEWS Score  MEWS Temp 0  MEWS Systolic 0  MEWS Pulse 0  MEWS RR 0  MEWS LOC 0  MEWS Score 0     Stacie Glaze LPN [facility]
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
## 2023-08-15 NOTE — Discharge Instructions (Addendum)
Return for any problem.  ?

## 2023-08-15 NOTE — Procedures (Signed)
We were asked to see this patient for dialysis. Pt was discharged from his OP HD unit in December 2023 due to behavioral issues. He no longer has a home outpatient HD unit. Pt came to ED today requesting hospital dialysis. The plan will be for "ED HD". Patient is not being admitted. Pt will go to the dialysis unit upstairs when they are ready for the pt. Then the pt will get dialysis. When dialysis is completed pt will be sent back to ED for reassessment.      Last OP HD orders (dec 2023):  3:15h  129kg  RUE AVG   Heparin 2000     - last hep B labs done here on --> 08/12/23   - pt typically comes on Tu, Th and Sat for hospital HD   Anemia of esrd:  Pt had darbe in late April and in early May, but Hb continued to drop, so dosing of darbepoeitin was increased up to 100 and then up to 150 mcg q 1-2 wks.    Tsat was low in May at 11% --> we gave him 1 gm load over 4 HD sessions. Repeat tsat on 6/25 remained low and pt got another 1gm IV fe load in June- july.  Hb responded then in August. Cont darbe q 1-2 wks.    The darbepoeitin needs to ordered for this patient as "once in dialysis" and not "SQ at 1800" because he needs to be ready to leave at 5 pm to catch his ride home.    HD access: new LUE AVG placed 04/07/23 by Dr Edilia Bo. Using AVG now.       Date             Hb         Tsat/ ferritin    Darbe SQ       ferric gluconate 12/02/22            11.0       22%/ 935 01/24/23           9.0        31%/ 1056   04/11/23            8.1        11%/ 545      100 mcg 5/18                                                                             250mg  #1 04/24/23             8.0                              60 mcg  04/27/23             7.3                                                      250mg  #2 04/29/23  100 mcg           250mg  #3       6/06                 7.5                               150 mcg 6/08                 7.9                                                         250mg  #4                          05/18/23            8.2                                150 mcg          250mg   6/25                                  12%/ 450 05/27/23                                                  100 mcg 05/31/23            8.4                                 150 mcg           250mg      06/06/23                                                    150 mcg         250mg  7/25                                                         150 mcg         250 mg 8/03                  10.4 8/10                  10.3                                 150 mcg 8/31  9.8 9/03                                                            150 mcg  9/14                    9.9      9/17     9.7                                      I was present at this dialysis session, have reviewed the session and made  appropriate changes Vinson Moselle MD  [facility] 08/15/2023, 2:08 PM

## 2023-08-15 NOTE — ED Notes (Signed)
Pt is due for dialysis today and after he is med cleared, he will need treatment.

## 2023-08-17 ENCOUNTER — Emergency Department (HOSPITAL_COMMUNITY)
Admission: EM | Admit: 2023-08-17 | Discharge: 2023-08-17 | Discharge status: Against Medical Advice | Payer: 59 | Attending: Student | Admitting: Student

## 2023-08-17 ENCOUNTER — Other Ambulatory Visit: Payer: Self-pay

## 2023-08-17 ENCOUNTER — Encounter (HOSPITAL_COMMUNITY): Payer: Self-pay

## 2023-08-17 DIAGNOSIS — Z794 Long term (current) use of insulin: Secondary | ICD-10-CM | POA: Diagnosis not present

## 2023-08-17 DIAGNOSIS — E1142 Type 2 diabetes mellitus with diabetic polyneuropathy: Secondary | ICD-10-CM | POA: Diagnosis not present

## 2023-08-17 DIAGNOSIS — Z87891 Personal history of nicotine dependence: Secondary | ICD-10-CM | POA: Insufficient documentation

## 2023-08-17 DIAGNOSIS — I132 Hypertensive heart and chronic kidney disease with heart failure and with stage 5 chronic kidney disease, or end stage renal disease: Secondary | ICD-10-CM | POA: Insufficient documentation

## 2023-08-17 DIAGNOSIS — S161XXA Strain of muscle, fascia and tendon at neck level, initial encounter: Secondary | ICD-10-CM | POA: Insufficient documentation

## 2023-08-17 DIAGNOSIS — R202 Paresthesia of skin: Secondary | ICD-10-CM | POA: Insufficient documentation

## 2023-08-17 DIAGNOSIS — J449 Chronic obstructive pulmonary disease, unspecified: Secondary | ICD-10-CM | POA: Diagnosis not present

## 2023-08-17 DIAGNOSIS — D631 Anemia in chronic kidney disease: Secondary | ICD-10-CM | POA: Diagnosis not present

## 2023-08-17 DIAGNOSIS — Z79899 Other long term (current) drug therapy: Secondary | ICD-10-CM | POA: Insufficient documentation

## 2023-08-17 DIAGNOSIS — I509 Heart failure, unspecified: Secondary | ICD-10-CM | POA: Insufficient documentation

## 2023-08-17 DIAGNOSIS — Z96653 Presence of artificial knee joint, bilateral: Secondary | ICD-10-CM | POA: Diagnosis not present

## 2023-08-17 DIAGNOSIS — Z992 Dependence on renal dialysis: Secondary | ICD-10-CM | POA: Diagnosis not present

## 2023-08-17 DIAGNOSIS — S199XXA Unspecified injury of neck, initial encounter: Secondary | ICD-10-CM | POA: Diagnosis present

## 2023-08-17 DIAGNOSIS — N186 End stage renal disease: Secondary | ICD-10-CM | POA: Diagnosis not present

## 2023-08-17 DIAGNOSIS — Z7982 Long term (current) use of aspirin: Secondary | ICD-10-CM | POA: Insufficient documentation

## 2023-08-17 DIAGNOSIS — R739 Hyperglycemia, unspecified: Secondary | ICD-10-CM

## 2023-08-17 LAB — BASIC METABOLIC PANEL
Anion gap: 14 (ref 5–15)
BUN: 65 mg/dL — ABNORMAL HIGH (ref 8–23)
CO2: 25 mmol/L (ref 22–32)
Calcium: 7.6 mg/dL — ABNORMAL LOW (ref 8.9–10.3)
Chloride: 85 mmol/L — ABNORMAL LOW (ref 98–111)
Creatinine, Ser: 6.14 mg/dL — ABNORMAL HIGH (ref 0.61–1.24)
GFR, Estimated: 10 mL/min — ABNORMAL LOW (ref >60)
Glucose, Bld: 664 mg/dL (ref 70–99)
Potassium: 3.7 mmol/L (ref 3.5–5.1)
Sodium: 124 mmol/L — ABNORMAL LOW (ref 135–145)

## 2023-08-17 LAB — CBC WITH DIFFERENTIAL/PLATELET
Abs Immature Granulocytes: 0.03 10*3/uL (ref 0.00–0.07)
Basophils Absolute: 0 10*3/uL (ref 0.0–0.1)
Basophils Relative: 0 %
Eosinophils Absolute: 0.2 10*3/uL (ref 0.0–0.5)
Eosinophils Relative: 3 %
HCT: 29.7 % — ABNORMAL LOW (ref 39.0–52.0)
Hemoglobin: 9.4 g/dL — ABNORMAL LOW (ref 13.0–17.0)
Immature Granulocytes: 1 %
Lymphocytes Relative: 11 %
Lymphs Abs: 0.7 10*3/uL (ref 0.7–4.0)
MCH: 27.8 pg (ref 26.0–34.0)
MCHC: 31.6 g/dL (ref 30.0–36.0)
MCV: 87.9 fL (ref 80.0–100.0)
Monocytes Absolute: 0.4 10*3/uL (ref 0.1–1.0)
Monocytes Relative: 7 %
Neutro Abs: 4.5 10*3/uL (ref 1.7–7.7)
Neutrophils Relative %: 78 %
Platelets: 109 10*3/uL — ABNORMAL LOW (ref 150–400)
RBC: 3.38 MIL/uL — ABNORMAL LOW (ref 4.22–5.81)
RDW: 17 % — ABNORMAL HIGH (ref 11.5–15.5)
WBC: 5.7 10*3/uL (ref 4.0–10.5)
nRBC: 0 % (ref 0.0–0.2)

## 2023-08-17 MED ORDER — CHLORHEXIDINE GLUCONATE CLOTH 2 % EX PADS: 6.0000 | MEDICATED_PAD | Freq: Every day | CUTANEOUS | Status: DC

## 2023-08-17 MED ORDER — DARBEPOETIN ALFA 150 MCG/0.3ML IJ SOSY: 150.0000 ug | PREFILLED_SYRINGE | Freq: Once | INTRAMUSCULAR | Status: DC

## 2023-08-17 MED ORDER — OXYCODONE-ACETAMINOPHEN 5-325 MG PO TABS
1.0000 | ORAL_TABLET | Freq: Once | ORAL | Status: AC
  Administered 2023-08-17: 1 via ORAL
  Filled 2023-08-17: qty 1

## 2023-08-17 MED ORDER — HEPARIN SODIUM (PORCINE) 1000 UNIT/ML IJ SOLN
INTRAMUSCULAR | Status: AC
  Administered 2023-08-17: 2000 [IU]
  Filled 2023-08-17: qty 2

## 2023-08-17 MED ORDER — LIDOCAINE 5 % EX PTCH
1.0000 | MEDICATED_PATCH | CUTANEOUS | Status: DC
  Administered 2023-08-17: 1 via TRANSDERMAL
  Filled 2023-08-17: qty 1

## 2023-08-17 MED ORDER — DARBEPOETIN ALFA 150 MCG/0.3ML IJ SOSY
150.0000 ug | PREFILLED_SYRINGE | Freq: Once | INTRAMUSCULAR | Status: DC
  Filled 2023-08-17 (×2): qty 0.3

## 2023-08-17 MED ORDER — HEPARIN SODIUM (PORCINE) 1000 UNIT/ML IJ SOLN
INTRAMUSCULAR | Status: AC
  Filled 2023-08-17: qty 5
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
## 2023-08-17 NOTE — ED Provider Notes (Signed)
I attempted to reevaluate patient after dialysis, but patient was not in his bed.  I asked nursing where he was, and they have not seen the patient.  I came back to the bedside 10 minutes later and patient is still not there.  I had independently reviewed and interpreted his labs taken before hemodialysis which revealed an elevated glucose of 663, wanted to recheck his CBG to determine if he may need insulin prior to discharge.  I was unable to evaluate the patient.  Patient eloped.  Was not able to discuss the risks of leaving with the patient.   Arabella Merles, PA-C 08/17/23 1805    Terrilee Files, MD 08/18/23 1102

## 2023-08-17 NOTE — Procedures (Signed)
We were asked to see this patient for dialysis. Pt was discharged from his OP HD unit in December 2023 due to behavioral issues. He no longer has a home outpatient HD unit. Pt came to ED today requesting hospital dialysis. The plan will be for "ED HD". Patient is not being admitted. Pt will go to the dialysis unit upstairs when they are ready for the pt. Then the pt will get dialysis. When dialysis is completed pt will be sent back to ED for reassessment.      Last OP HD orders (dec 2023):  3:15h  129kg  RUE AVG   Heparin 2000     - last hep B labs done here on --> 08/12/23   - pt typically comes on Tu, Th and Sat for hospital HD   Anemia of esrd:  Pt had darbe in late April and in early May, but Hb continued to drop, so dosing of darbepoeitin was increased up to 100 and then up to 150 mcg q 1-2 wks.    Tsat was low in May at 11% --> we gave him 1 gm load over 4 HD sessions. Repeat tsat on 6/25 remained low and pt got another 1gm IV fe load in June- july.  Hb responded then in August. Cont darbe q 1-2 wks.    The darbepoeitin needs to ordered for this patient as "once in dialysis" and not "SQ at 1800" because he needs to be ready to leave at 5 pm to catch his ride home.    HD access: new LUE AVG placed 04/07/23 by Dr Edilia Bo. Using AVG now.       Date             Hb         Tsat/ ferritin    Darbe SQ       ferric gluconate 12/02/22            11.0       22%/ 935 01/24/23           9.0        31%/ 1056   04/11/23            8.1        11%/ 545      100 mcg 5/18                                                                             250mg  #1 04/24/23             8.0                              60 mcg  04/27/23             7.3                                                      250mg  #2 04/29/23  100 mcg           250mg  #3       6/06                 7.5                               150 mcg 6/08                 7.9                                                         250mg  #4                          05/18/23            8.2                                150 mcg          250mg   6/25                                  12%/ 450 05/27/23                                                  100 mcg 05/31/23            8.4                                 150 mcg           250mg      06/06/23                                                    150 mcg         250mg  7/25                                                         150 mcg         250 mg 8/03                  10.4 8/10                  10.3                                 150 mcg 8/31  9.8 9/03                                                            150 mcg  9/14                    9.9      9/17                    9.7                                      I was present at this dialysis session, have reviewed the session and made  appropriate changes Vinson Moselle MD  [facility] 08/17/2023, 4:51 PM
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
## 2023-08-17 NOTE — ED Notes (Signed)
EDP at bedside

## 2023-08-17 NOTE — Progress Notes (Signed)
   08/17/23 1648  Vitals  Temp 98.1 F (36.7 C)  Pulse Rate 93  Resp 14  BP 130/64  SpO2 99 %  O2 Device Nasal Cannula  Oxygen Therapy  O2 Flow Rate (L/min) 3 L/min  Patient Activity (if Appropriate) In bed  Pulse Oximetry Type Continuous  Post Treatment  Dialyzer Clearance Clear  Hemodialysis Intake (mL) 0 mL  Liters Processed 44.4  Fluid Removed (mL) 1300 mL  Tolerated HD Treatment Yes  Post-Hemodialysis Comments Pt . Sign off AMA,pt had an 1 hour and 51 mins left in HD treatment.States, his ride is waiting on him.Report call to ED Bedside [place] Paramedic  During Treatment Monitoring  Duration of HD Treatment -hour(s) 1.51 hour(s)   Received patient in bed to unit.  Alert and oriented.  Informed consent signed and in chart.   TX duration:1.51 mins  Patient tolerated well.  Transported back to the room  Alert, without acute distress.  Hand-off given to patient's nurse.   Access used: Yes Access issues: No  Total UF removed: 1300,pt Left AMA, D/T pt. Did not want to miss his ride Medication(s) given: See MAR Post HD VS: See Above Grid Post HD weight: No Bed Weight   [facility]

## 2023-08-17 NOTE — Progress Notes (Signed)
   08/17/23 1648  Vitals  Temp 98.1 F (36.7 C)  Pulse Rate 93  Resp 14  BP 130/64  SpO2 99 %  O2 Device Nasal Cannula  Oxygen Therapy  O2 Flow Rate (L/min) 3 L/min  Patient Activity (if Appropriate) In bed  Pulse Oximetry Type Continuous  Post Treatment  Dialyzer Clearance Clear  Hemodialysis Intake (mL) 0 mL  Liters Processed 44.4  Fluid Removed (mL) 1300 mL  Tolerated HD Treatment Yes  Post-Hemodialysis Comments Pt . Sign off AMA,pt had an 1 hour and 51 mins left in HD treatment.States, his ride is waiting on him.Report call to ED Bedside [place] Paramedic  During Treatment Monitoring  Duration of HD Treatment -hour(s) 1.51 hour(s)

## 2023-08-22 ENCOUNTER — Emergency Department (HOSPITAL_COMMUNITY)
Admission: EM | Admit: 2023-08-22 | Discharge: 2023-08-22 | Disposition: A | Discharge status: Home / Self Care | Payer: 59 | Attending: Nephrology | Admitting: Nephrology

## 2023-08-22 ENCOUNTER — Encounter (HOSPITAL_COMMUNITY): Payer: Self-pay

## 2023-08-22 ENCOUNTER — Other Ambulatory Visit: Payer: Self-pay

## 2023-08-22 DIAGNOSIS — Z992 Dependence on renal dialysis: Secondary | ICD-10-CM | POA: Diagnosis present

## 2023-08-22 DIAGNOSIS — Z5321 Procedure and treatment not carried out due to patient leaving prior to being seen by health care provider: Secondary | ICD-10-CM | POA: Diagnosis not present

## 2023-08-22 LAB — RENAL FUNCTION PANEL
Albumin: 3.4 g/dL — ABNORMAL LOW (ref 3.5–5.0)
Anion gap: 16 — ABNORMAL HIGH (ref 5–15)
BUN: 100 mg/dL — ABNORMAL HIGH (ref 8–23)
CO2: 25 mmol/L (ref 22–32)
Calcium: 7.1 mg/dL — ABNORMAL LOW (ref 8.9–10.3)
Chloride: 81 mmol/L — ABNORMAL LOW (ref 98–111)
Creatinine, Ser: 6.13 mg/dL — ABNORMAL HIGH (ref 0.61–1.24)
GFR, Estimated: 10 mL/min — ABNORMAL LOW (ref >60)
Glucose, Bld: 712 mg/dL (ref 70–99)
Phosphorus: 4.9 mg/dL — ABNORMAL HIGH (ref 2.5–4.6)
Potassium: 3.6 mmol/L (ref 3.5–5.1)
Sodium: 122 mmol/L — ABNORMAL LOW (ref 135–145)

## 2023-08-22 LAB — CBC
HCT: 27.6 % — ABNORMAL LOW (ref 39.0–52.0)
Hemoglobin: 8.8 g/dL — ABNORMAL LOW (ref 13.0–17.0)
MCH: 27.1 pg (ref 26.0–34.0)
MCHC: 31.9 g/dL (ref 30.0–36.0)
MCV: 84.9 fL (ref 80.0–100.0)
Platelets: 121 10*3/uL — ABNORMAL LOW (ref 150–400)
RBC: 3.25 MIL/uL — ABNORMAL LOW (ref 4.22–5.81)
RDW: 15.9 % — ABNORMAL HIGH (ref 11.5–15.5)
WBC: 5.9 10*3/uL (ref 4.0–10.5)
nRBC: 0 % (ref 0.0–0.2)

## 2023-08-22 MED ORDER — CHLORHEXIDINE GLUCONATE CLOTH 2 % EX PADS: 6.0000 | MEDICATED_PAD | Freq: Every day | CUTANEOUS | Status: DC

## 2023-08-22 NOTE — ED Triage Notes (Signed)
Pt here for dialysis. Last treatment was last Thursday. Pt has no complaints.

## 2023-08-22 NOTE — Progress Notes (Signed)
   08/22/23 1430  Vitals  Temp 98.2 F (36.8 C)  Pulse Rate 91  Resp 14  BP (!) 166/91  SpO2 100 %  Post Treatment  Dialyzer Clearance Clear  Hemodialysis Intake (mL) 0 mL  Liters Processed 84  Fluid Removed (mL) 2000 mL  Tolerated HD Treatment Yes  AVG/AVF Arterial Site Held (minutes) 8 minutes  AVG/AVF Venous Site Held (minutes) 8 minutes  During Treatment Monitoring  Duration of HD Treatment -hour(s) 3.5 hour(s)   Received patient in bed to unit.  Alert and oriented.  Informed consent signed and in chart.   TX duration:3.5hrs  Patient tolerated well.  Transported back to the room  Alert, without acute distress.  Hand-off given to patient's nurse.   Access used: LAVG Access issues: none  Total UF removed: 2L Medication(s) given: none   [facility]

## 2023-08-24 ENCOUNTER — Emergency Department (HOSPITAL_COMMUNITY)
Admission: EM | Admit: 2023-08-24 | Discharge: 2023-08-25 | Disposition: A | Discharge status: Home / Self Care | Payer: 59

## 2023-08-24 DIAGNOSIS — Z7982 Long term (current) use of aspirin: Secondary | ICD-10-CM | POA: Diagnosis not present

## 2023-08-24 DIAGNOSIS — N186 End stage renal disease: Secondary | ICD-10-CM | POA: Insufficient documentation

## 2023-08-24 DIAGNOSIS — Z794 Long term (current) use of insulin: Secondary | ICD-10-CM | POA: Insufficient documentation

## 2023-08-24 DIAGNOSIS — Z992 Dependence on renal dialysis: Secondary | ICD-10-CM | POA: Insufficient documentation

## 2023-08-24 LAB — CBG MONITORING, ED: Glucose-Capillary: 165 mg/dL — ABNORMAL HIGH (ref 70–99)

## 2023-08-24 MED ORDER — VITAMIN D 25 MCG (1000 UNIT) PO TABS: 2000.0000 [IU] | ORAL_TABLET | Freq: Every day | ORAL | Status: DC

## 2023-08-24 MED ORDER — ATORVASTATIN CALCIUM 40 MG PO TABS: 40.0000 mg | ORAL_TABLET | Freq: Every morning | ORAL | Status: DC

## 2023-08-24 MED ORDER — TORSEMIDE 20 MG PO TABS: 120.0000 mg | ORAL_TABLET | Freq: Two times a day (BID) | ORAL | Status: DC

## 2023-08-24 MED ORDER — PANTOPRAZOLE SODIUM 40 MG PO TBEC: 40.0000 mg | DELAYED_RELEASE_TABLET | Freq: Every day | ORAL | Status: DC

## 2023-08-24 MED ORDER — HEPARIN SODIUM (PORCINE) 1000 UNIT/ML IJ SOLN
INTRAMUSCULAR | Status: AC
  Administered 2023-08-24: 1000 [IU]
  Filled 2023-08-24: qty 3

## 2023-08-24 MED ORDER — PREGABALIN 25 MG PO CAPS: 75.0000 mg | ORAL_CAPSULE | ORAL | Status: DC

## 2023-08-24 MED ORDER — PREGABALIN 25 MG PO CAPS
75.0000 mg | ORAL_CAPSULE | ORAL | Status: DC
  Administered 2023-08-25: 75 mg via ORAL
  Filled 2023-08-24: qty 3

## 2023-08-24 MED ORDER — TAMSULOSIN HCL 0.4 MG PO CAPS: 0.4000 mg | ORAL_CAPSULE | Freq: Every day | ORAL | Status: DC

## 2023-08-24 MED ORDER — TRAZODONE HCL 50 MG PO TABS
150.0000 mg | ORAL_TABLET | Freq: Every day | ORAL | Status: DC
  Administered 2023-08-25: 150 mg via ORAL
  Filled 2023-08-24: qty 3

## 2023-08-24 MED ORDER — SERTRALINE HCL 100 MG PO TABS: 100.0000 mg | ORAL_TABLET | Freq: Every morning | ORAL | Status: DC

## 2023-08-24 MED ORDER — AMLODIPINE BESYLATE 5 MG PO TABS: 10.0000 mg | ORAL_TABLET | Freq: Every morning | ORAL | Status: DC

## 2023-08-24 MED ORDER — HEPARIN SODIUM (PORCINE) 1000 UNIT/ML DIALYSIS: 2500.0000 [IU] | Freq: Once | INTRAMUSCULAR | Status: DC

## 2023-08-24 MED ORDER — INSULIN ASPART 100 UNIT/ML IJ SOLN
45.0000 [IU] | Freq: Every day | INTRAMUSCULAR | Status: DC
  Administered 2023-08-25: 45 [IU] via SUBCUTANEOUS

## 2023-08-24 MED ORDER — CLOPIDOGREL BISULFATE 75 MG PO TABS: 75.0000 mg | ORAL_TABLET | Freq: Every day | ORAL | Status: DC

## 2023-08-24 MED ORDER — INSULIN REGULAR HUMAN (CONC) 500 UNIT/ML ~~LOC~~ SOPN: 45.0000 [IU] | PEN_INJECTOR | Freq: Every day | SUBCUTANEOUS | Status: DC

## 2023-08-24 MED ORDER — NORTRIPTYLINE HCL 10 MG PO CAPS: 20.0000 mg | ORAL_CAPSULE | Freq: Every day | ORAL | Status: DC

## 2023-08-24 MED ORDER — CARVEDILOL 12.5 MG PO TABS: 12.5000 mg | ORAL_TABLET | Freq: Two times a day (BID) | ORAL | Status: DC

## 2023-08-24 MED ORDER — CHLORHEXIDINE GLUCONATE CLOTH 2 % EX PADS: 6.0000 | MEDICATED_PAD | Freq: Every day | CUTANEOUS | Status: DC

## 2023-08-24 MED ORDER — CALCIUM ACETATE (PHOS BINDER) 667 MG PO CAPS: 1334.0000 mg | ORAL_CAPSULE | Freq: Three times a day (TID) | ORAL | Status: DC

## 2023-08-24 NOTE — ED Triage Notes (Signed)
Pt here for dialysis. Last full treatment on Tuesday. Pt has no complaints.

## 2023-08-24 NOTE — Progress Notes (Addendum)
Pt had bad cramping after HD.Goal not met d/t cramping.`Patient became anxious and try leave by himself. Pt said that " gave me wheelchair. I want back home now. I want off 4:30 pm you keep me here. "Pt sent back to ED.H13 by transportation.  08/24/23 1710  Vitals  Temp 98.1 F (36.7 C)  BP 130/70  BP Location Right Arm  BP Method Automatic  Oxygen Therapy  O2 Device Nasal Cannula  O2 Flow Rate (L/min) 3 L/min  During Treatment Monitoring  Intra-Hemodialysis Comments Tx completed  Post Treatment  Dialyzer Clearance Clotted  Hemodialysis Intake (mL) 0 mL  Liters Processed 82  Fluid Removed (mL) 2000 mL  Tolerated HD Treatment Yes  Post-Hemodialysis Comments Pt had bad cramping after HD. goal not met d/t cramping.  AVG/AVF Arterial Site Held (minutes) 7 minutes  AVG/AVF Venous Site Held (minutes) 7 minutes  Fistula / Graft Left Upper arm Arteriovenous vein graft  Placement Date/Time: (c) 04/22/23 (c) 1000   Orientation: Left  Access Location: Upper arm  Access Type: Arteriovenous vein graft  Site Condition No complications  Fistula / Graft Assessment Present;Thrill;Bruit  Status Deaccessed  Needle Size 15  Drainage Description None

## 2023-08-24 NOTE — Care Management (Signed)
ED RNCM was consulted concerning patient needing w/c transportation home post HD. ED RNCM met with patient who states that his w/c ride left after waiting an hour for patient due to a delay in [facility] HD Unit.  Attempted to contact several w/c transport companies no drivers available at late hour. [facility]  [phone] has  arranged to pick patient up in the am.  Updated patient and ED staff, who are agreeable patient will board in the ED.   Michel Bickers RN, BSN CNOR ED RN Care Manager [phone]

## 2023-08-24 NOTE — ED Notes (Signed)
Patients ride left him due to dialysis delaying in from allowing him to come back down stairs at 1630.  Patient is wheelchair bound and was using insurance transportation which ended at 1800.  Contacted case management and unable to get ride for patient until morning.  Patient will board in yellow 44.  I have ordered a hospital bed for him and Sherwood Gambler is arranging transportation for the morning.

## 2023-08-24 NOTE — ED Provider Notes (Signed)
[place] EMERGENCY DEPARTMENT AT [facility] Provider Note   CSN: [id number] Arrival date & time: 08/24/23  2440     History  Chief Complaint  Patient presents with   Needs Diaylsis    Needs dialysis    Jeffrey [name] is a 62 y.o. male.  62 year old male ESRD on dialysis who has been coming to the emergency department for his regularly scheduled dialysis presents today requesting dialysis.  Last dialysis session was Tuesday, full session.  Patient states he is in his usual state of health and has no complaints.  Denies chest pain, shortness of breath.  He is on his normal amount of oxygen and has no specific complaints this morning        Home Medications Prior to Admission medications   Medication Sig Start Date End Date Taking? Authorizing Provider  acetaminophen (TYLENOL) 325 MG tablet Take 2 tablets (650 mg total) by mouth every 6 (six) hours. 07/23/23   Lockie Mola, MD  amLODipine (NORVASC) 10 MG tablet Take 10 mg by mouth every morning. 04/17/20   [provider]  ASPIRIN LOW DOSE 81 MG EC tablet Take 81 mg by mouth in the morning. 02/22/21   [provider]  atorvastatin (LIPITOR) 40 MG tablet Take 40 mg by mouth in the morning. 02/09/21   [provider]  calcium acetate (PHOSLO) 667 MG capsule Take 2 capsules (1,334 mg total) by mouth with breakfast, with lunch, and with evening meal. 04/10/21   Calvert Cantor, MD  Calcium Carbonate Antacid (CALCIUM CARBONATE, DOSED IN MG ELEMENTAL CALCIUM,) 1250 MG/5ML SUSP Take 5 mLs (500 mg of elemental calcium total) by mouth every 6 (six) hours as needed for indigestion. 04/08/23   Rhetta Mura, MD  carvedilol (COREG) 12.5 MG tablet Take 12.5 mg by mouth 2 (two) times daily with a meal. 04/17/20   [provider]  Cholecalciferol (VITAMIN D) 50 MCG (2000 UT) CAPS Take 2,000 Units by mouth daily.    [provider]  clopidogrel (PLAVIX) 75 MG tablet Take 1 tablet (75 mg total)  by mouth daily. 11/02/22   Rhyne, Ames Coupe, PA-C  cyclobenzaprine (FLEXERIL) 10 MG tablet Take 10 mg by mouth 3 (three) times daily as needed for muscle spasms. 12/29/21   [provider]  diazepam (VALIUM) 5 MG tablet Take 5 mg by mouth 2 (two) times daily as needed for muscle spasms. 12/09/21   [provider]  insulin regular human CONCENTRATED (HUMULIN R U-500 KWIKPEN) 500 UNIT/ML KwikPen Inject 45 Units into the skin daily with supper. Patient taking differently: Inject 70-125 Units into the skin 2 (two) times daily with a meal. 09/04/21 08/30/24  [name], MD  nortriptyline (PAMELOR) 10 MG capsule Take 20 mg by mouth at bedtime.    [provider]  oxyCODONE (OXY IR/ROXICODONE) 5 MG immediate release tablet Take 1 tablet (5 mg total) by mouth every 4 (four) hours as needed for severe pain or moderate pain (please give patient scheduled tylenol BEFORE oxycodone). 07/23/23   Lockie Mola, MD  pantoprazole (PROTONIX) 40 MG tablet Take 40 mg by mouth daily before breakfast. 02/09/21   [provider]  pregabalin (LYRICA) 75 MG capsule Take 1 capsule (75 mg total) by mouth See admin instructions. Daily.  Give after dialysis on dialysis days. 01/21/23   [name], MD  sertraline (ZOLOFT) 100 MG tablet Take 100 mg by mouth in the morning. 02/22/21   [provider]  tamsulosin (FLOMAX) 0.4 MG  CAPS capsule Take 1 capsule (0.4 mg total) by mouth daily. 11/10/21   [name], MD  torsemide (DEMADEX) 20 MG tablet Take 120 mg by mouth 2 (two) times daily.    [provider]  traZODone (DESYREL) 150 MG tablet Take 150 mg by mouth at bedtime. 12/09/21   [provider]      Allergies    Actos [pioglitazone], Dexmedetomidine, Ibuprofen, Tomato, and Wellbutrin [bupropion]    Review of Systems   Review of Systems  Physical Exam Updated Vital Signs BP 126/76 (BP Location: Right Arm)   Pulse 62   Temp 98.4 F (36.9 C) (Oral)   Resp  20   Ht 5\' 5"  (1.651 m)   Wt 130.4 kg   SpO2 100%   BMI 47.84 kg/m  Physical Exam Vitals and nursing note reviewed.  Constitutional:      General: He is not in acute distress.    Appearance: He is not toxic-appearing.  HENT:     Head: Normocephalic.     Nose: Nose normal.     Mouth/Throat:     Mouth: Mucous membranes are moist.  Eyes:     Conjunctiva/sclera: Conjunctivae normal.  Cardiovascular:     Rate and Rhythm: Normal rate and regular rhythm.  Pulmonary:     Effort: Pulmonary effort is normal.     Breath sounds: Normal breath sounds.  Abdominal:     General: Abdomen is flat. There is no distension.     Palpations: Abdomen is soft.  Musculoskeletal:     Right lower leg: No edema.     Left lower leg: No edema.  Skin:    General: Skin is warm.     Capillary Refill: Capillary refill takes less than 2 seconds.  Neurological:     General: No focal deficit present.     Mental Status: He is alert and oriented to person, place, and time.  Psychiatric:        Mood and Affect: Mood normal.        Behavior: Behavior normal.     ED Results / Procedures / Treatments   Labs (all labs ordered are listed, but only abnormal results are displayed) Labs Reviewed  CBG MONITORING, ED - Abnormal; Notable for the following components:      Result Value   Glucose-Capillary 165 (*)    All other components within normal limits  CBG MONITORING, ED - Abnormal; Notable for the following components:   Glucose-Capillary 433 (*)    All other components within normal limits    EKG None  Radiology No results found.  Procedures Procedures    Medications Ordered in ED Medications  Chlorhexidine Gluconate Cloth 2 % PADS 6 each (6 each Topical Patient Refused/Not Given 08/25/23 0501)  amLODipine (NORVASC) tablet 10 mg (has no administration in time range)  atorvastatin (LIPITOR) tablet 40 mg (has no administration in time range)  calcium acetate (PHOSLO) capsule 1,334 mg (has no  administration in time range)  cholecalciferol (VITAMIN D3) 25 MCG (1000 UNIT) tablet 2,000 Units (has no administration in time range)  clopidogrel (PLAVIX) tablet 75 mg (has no administration in time range)  pantoprazole (PROTONIX) EC tablet 40 mg (has no administration in time range)  sertraline (ZOLOFT) tablet 100 mg (has no administration in time range)  torsemide (DEMADEX) tablet 120 mg (has no administration in time range)  tamsulosin (FLOMAX) capsule 0.4 mg (has no administration in time range)  traZODone (DESYREL) tablet 150 mg (150 mg Oral Given 08/25/23  5409)  pregabalin (LYRICA) capsule 75 mg (75 mg Oral Given 08/25/23 0029)  insulin aspart (novoLOG) injection 45 Units (45 Units Subcutaneous Given 08/25/23 0030)  carvedilol (COREG) tablet 12.5 mg (12.5 mg Oral Given 08/25/23 0029)  nortriptyline (PAMELOR) capsule 10 mg (10 mg Oral Given 08/25/23 0137)  diazepam (VALIUM) tablet 5 mg (5 mg Oral Given 08/25/23 0101)  cyclobenzaprine (FLEXERIL) tablet 10 mg (10 mg Oral Given 08/25/23 0101)  heparin sodium (porcine) 1000 UNIT/ML injection (1,000 Units  Given 08/24/23 1348)    ED Course/ Medical Decision Making/ A&P Clinical Course as of 08/25/23 1102  Thu Aug 24, 2023  0741 Discussed with nephrology; will go for dialysis [TY]  1156 Spoke with nephrology; patient should be going for next round roughly 1230 [TY]    Clinical Course User Index [TY] Coral Spikes, DO                                 Medical Decision Making 62 year old male ESRD on dialysis presents for dialysis.  Per chart review appears that he does not have an outpatient chair and comes to the emergency department for his regular dialysis.  He has no specific complaints today.  Does not appear to be in significant overload, clear breath sounds bilaterally.  Will reach out to nephrology regarding dialysis. See ED course for further MDM and disposition.           Final Clinical Impression(s) / ED Diagnoses Final  diagnoses:  None    Rx / DC Orders ED Discharge Orders     None         Coral Spikes, DO 08/25/23 1102

## 2023-08-24 NOTE — ED Notes (Signed)
Dinner tray ordered.

## 2023-08-25 DIAGNOSIS — Z992 Dependence on renal dialysis: Secondary | ICD-10-CM | POA: Diagnosis not present

## 2023-08-25 LAB — CBG MONITORING, ED: Glucose-Capillary: 433 mg/dL — ABNORMAL HIGH (ref 70–99)

## 2023-08-25 MED ORDER — DIAZEPAM 5 MG PO TABS
5.0000 mg | ORAL_TABLET | Freq: Two times a day (BID) | ORAL | Status: DC | PRN
  Administered 2023-08-25: 5 mg via ORAL
  Filled 2023-08-25: qty 1

## 2023-08-25 MED ORDER — CARVEDILOL 12.5 MG PO TABS
12.5000 mg | ORAL_TABLET | Freq: Two times a day (BID) | ORAL | Status: DC
  Administered 2023-08-25: 12.5 mg via ORAL
  Filled 2023-08-25: qty 1

## 2023-08-25 MED ORDER — CYCLOBENZAPRINE HCL 10 MG PO TABS
10.0000 mg | ORAL_TABLET | Freq: Three times a day (TID) | ORAL | Status: DC | PRN
  Administered 2023-08-25: 10 mg via ORAL
  Filled 2023-08-25: qty 1

## 2023-08-25 MED ORDER — NORTRIPTYLINE HCL 10 MG PO CAPS
10.0000 mg | ORAL_CAPSULE | Freq: Every day | ORAL | Status: DC
  Administered 2023-08-25: 10 mg via ORAL
  Filled 2023-08-25: qty 1

## 2023-08-25 NOTE — ED Notes (Signed)
Patient awoken by cramp. He is in his chair trying to get it to go away. At this time I do not have any medications that can help at this time. Offered other solutions but to no avail. Patient trying to get rid of cramp.

## 2023-08-25 NOTE — ED Notes (Signed)
Called [facility], he is able to come get him now.

## 2023-08-25 NOTE — ED Provider Notes (Signed)
  Physical Exam  BP 126/76 (BP Location: Right Arm)   Pulse 62   Temp 98.4 F (36.9 C) (Oral)   Resp 20   Ht 5\' 5"  (1.651 m)   Wt 130.4 kg   SpO2 100%   BMI 47.84 kg/m   Physical Exam  Procedures  Procedures  ED Course / MDM   Clinical Course as of 08/25/23 0119  Thu Aug 24, 2023  8546 Discussed with nephrology; will go for dialysis [TY]  1156 Spoke with nephrology; patient should be going for next round roughly 1230 [TY]    Clinical Course User Index [TY] Coral Spikes, DO   Medical Decision Making  Returnd from dialysis and ready for discharge, however returned too late and does not have a way to get back. TOC was consulted-cannot take PTAR due to wheelchair, is awaiting transport home in the morning on his home O2. Denies any other acute concerns. Home meds have been ordered by [facility].        Alvira Monday, MD 08/25/23 [phone]

## 2023-08-26 ENCOUNTER — Other Ambulatory Visit: Payer: Self-pay

## 2023-08-26 ENCOUNTER — Emergency Department (HOSPITAL_COMMUNITY)
Admission: EM | Admit: 2023-08-26 | Discharge: 2023-08-26 | Disposition: A | Discharge status: Home / Self Care | Payer: 59 | Attending: Emergency Medicine | Admitting: Emergency Medicine

## 2023-08-26 DIAGNOSIS — J449 Chronic obstructive pulmonary disease, unspecified: Secondary | ICD-10-CM | POA: Insufficient documentation

## 2023-08-26 DIAGNOSIS — E1165 Type 2 diabetes mellitus with hyperglycemia: Secondary | ICD-10-CM | POA: Diagnosis not present

## 2023-08-26 DIAGNOSIS — Z794 Long term (current) use of insulin: Secondary | ICD-10-CM | POA: Insufficient documentation

## 2023-08-26 DIAGNOSIS — Z7982 Long term (current) use of aspirin: Secondary | ICD-10-CM | POA: Insufficient documentation

## 2023-08-26 DIAGNOSIS — Z7901 Long term (current) use of anticoagulants: Secondary | ICD-10-CM | POA: Insufficient documentation

## 2023-08-26 DIAGNOSIS — N186 End stage renal disease: Secondary | ICD-10-CM | POA: Insufficient documentation

## 2023-08-26 DIAGNOSIS — E1122 Type 2 diabetes mellitus with diabetic chronic kidney disease: Secondary | ICD-10-CM | POA: Diagnosis not present

## 2023-08-26 DIAGNOSIS — I132 Hypertensive heart and chronic kidney disease with heart failure and with stage 5 chronic kidney disease, or end stage renal disease: Secondary | ICD-10-CM | POA: Diagnosis not present

## 2023-08-26 DIAGNOSIS — Z992 Dependence on renal dialysis: Secondary | ICD-10-CM | POA: Insufficient documentation

## 2023-08-26 DIAGNOSIS — Z79899 Other long term (current) drug therapy: Secondary | ICD-10-CM | POA: Insufficient documentation

## 2023-08-26 DIAGNOSIS — I2510 Atherosclerotic heart disease of native coronary artery without angina pectoris: Secondary | ICD-10-CM | POA: Insufficient documentation

## 2023-08-26 DIAGNOSIS — R739 Hyperglycemia, unspecified: Secondary | ICD-10-CM | POA: Diagnosis present

## 2023-08-26 DIAGNOSIS — I509 Heart failure, unspecified: Secondary | ICD-10-CM | POA: Insufficient documentation

## 2023-08-26 LAB — CBG MONITORING, ED: Glucose-Capillary: 438 mg/dL — ABNORMAL HIGH (ref 70–99)

## 2023-08-26 MED ORDER — OXYCODONE HCL 5 MG PO TABS
5.0000 mg | ORAL_TABLET | Freq: Once | ORAL | Status: AC
  Administered 2023-08-26: 5 mg via ORAL
  Filled 2023-08-26: qty 1

## 2023-08-26 MED ORDER — DIAZEPAM 5 MG PO TABS
5.0000 mg | ORAL_TABLET | Freq: Once | ORAL | Status: AC
  Administered 2023-08-26: 5 mg via ORAL
  Filled 2023-08-26: qty 1

## 2023-08-26 MED ORDER — SERTRALINE HCL 100 MG PO TABS
100.0000 mg | ORAL_TABLET | Freq: Every day | ORAL | Status: DC
  Administered 2023-08-26: 100 mg via ORAL
  Filled 2023-08-26: qty 1

## 2023-08-26 MED ORDER — INSULIN ASPART 100 UNIT/ML IJ SOLN
80.0000 [IU] | Freq: Once | INTRAMUSCULAR | Status: AC
  Administered 2023-08-26: 80 [IU] via SUBCUTANEOUS

## 2023-08-26 MED ORDER — CHLORHEXIDINE GLUCONATE CLOTH 2 % EX PADS: 6.0000 | MEDICATED_PAD | Freq: Every day | CUTANEOUS | Status: DC

## 2023-08-26 MED ORDER — DARBEPOETIN ALFA 100 MCG/0.5ML IJ SOSY
100.0000 ug | PREFILLED_SYRINGE | Freq: Once | INTRAMUSCULAR | Status: DC
  Filled 2023-08-26: qty 0.5

## 2023-08-26 NOTE — Procedures (Signed)
Patient was seen on dialysis and the procedure was supervised.  BFR 400  Via AVG BP is  140/69.Marland Kitchen   Patient appears to be tolerating treatment well.   Jeffrey [name] 08/26/2023

## 2023-08-26 NOTE — ED Provider Notes (Signed)
[place] EMERGENCY DEPARTMENT AT [facility] Provider Note   CSN: [id number] Arrival date & time: 08/26/23  8295     History  No chief complaint on file.   Jeffrey [name] is a 62 y.o. male.  Pt is a 62 yo male with pmhx significant for ESRD on HD (Tu, Th, Sat), DM2, HTN, HLD, arthritis, CHF, COPD (home O2 at 2L), CAD, GERD, and depression.  Pt has been without a home HD unit since December of 2023.  He has been coming to the ED for HD since then.  He was last dialyzed on 9/26.  He has not taken his meds this am.  He said his pcp is at [facility] and he was last seen 2 months ago.  However, epic notes from June say he's been seeing a provider in [place], not at [facility].  So, it is unclear if he's getting any pcp care.  He is on massive amounts of insulin, but no oral dm meds.  He's been getting his dialysis via left arm AVF and wants his port out.  He said he feels fine today.       Home Medications Prior to Admission medications   Medication Sig Start Date End Date Taking? Authorizing Provider  acetaminophen (TYLENOL) 325 MG tablet Take 2 tablets (650 mg total) by mouth every 6 (six) hours. 07/23/23   Lockie Mola, MD  amLODipine (NORVASC) 10 MG tablet Take 10 mg by mouth every morning. 04/17/20   [provider]  ASPIRIN LOW DOSE 81 MG EC tablet Take 81 mg by mouth in the morning. 02/22/21   [provider]  atorvastatin (LIPITOR) 40 MG tablet Take 40 mg by mouth in the morning. 02/09/21   [provider]  calcium acetate (PHOSLO) 667 MG capsule Take 2 capsules (1,334 mg total) by mouth with breakfast, with lunch, and with evening meal. 04/10/21   Calvert Cantor, MD  Calcium Carbonate Antacid (CALCIUM CARBONATE, DOSED IN MG ELEMENTAL CALCIUM,) 1250 MG/5ML SUSP Take 5 mLs (500 mg of elemental calcium total) by mouth every 6 (six) hours as needed for indigestion. 04/08/23   Rhetta Mura, MD  carvedilol (COREG) 12.5 MG tablet Take 12.5 mg by mouth 2 (two) times  daily with a meal. 04/17/20   [provider]  Cholecalciferol (VITAMIN D) 50 MCG (2000 UT) CAPS Take 2,000 Units by mouth daily.    [provider]  clopidogrel (PLAVIX) 75 MG tablet Take 1 tablet (75 mg total) by mouth daily. 11/02/22   Rhyne, Ames Coupe, PA-C  cyclobenzaprine (FLEXERIL) 10 MG tablet Take 10 mg by mouth 3 (three) times daily as needed for muscle spasms. 12/29/21   [provider]  diazepam (VALIUM) 5 MG tablet Take 5 mg by mouth 2 (two) times daily as needed for muscle spasms. 12/09/21   [provider]  insulin regular human CONCENTRATED (HUMULIN R U-500 KWIKPEN) 500 UNIT/ML KwikPen Inject 45 Units into the skin daily with supper. Patient taking differently: Inject 70-125 Units into the skin 2 (two) times daily with a meal. 09/04/21 08/30/24  [name], MD  nortriptyline (PAMELOR) 10 MG capsule Take 20 mg by mouth at bedtime.    [provider]  oxyCODONE (OXY IR/ROXICODONE) 5 MG immediate release tablet Take 1 tablet (5 mg total) by mouth every 4 (four) hours as needed for severe pain or moderate pain (please give patient scheduled tylenol BEFORE oxycodone). 07/23/23   Lockie Mola, MD  pantoprazole (PROTONIX) 40 MG tablet  Take 40 mg by mouth daily before breakfast. 02/09/21   [provider]  pregabalin (LYRICA) 75 MG capsule Take 1 capsule (75 mg total) by mouth See admin instructions. Daily.  Give after dialysis on dialysis days. 01/21/23   [name], MD  sertraline (ZOLOFT) 100 MG tablet Take 100 mg by mouth in the morning. 02/22/21   [provider]  tamsulosin (FLOMAX) 0.4 MG CAPS capsule Take 1 capsule (0.4 mg total) by mouth daily. 11/10/21   [name], MD  torsemide (DEMADEX) 20 MG tablet Take 120 mg by mouth 2 (two) times daily.    [provider]  traZODone (DESYREL) 150 MG tablet Take 150 mg by mouth at bedtime. 12/09/21   [provider]      Allergies    Actos [pioglitazone],  Dexmedetomidine, Ibuprofen, Tomato, and Wellbutrin [bupropion]    Review of Systems   Review of Systems  All other systems reviewed and are negative.   Physical Exam Updated Vital Signs BP (!) 139/48 (BP Location: Right Arm)   Pulse 87   Temp 98.1 F (36.7 C) (Oral)   Resp 20   SpO2 98%  Physical Exam Vitals and nursing note reviewed.  Constitutional:      Appearance: Normal appearance. He is obese.  HENT:     Head: Normocephalic and atraumatic.     Right Ear: External ear normal.     Left Ear: External ear normal.     Nose: Nose normal.     Mouth/Throat:     Mouth: Mucous membranes are moist.     Pharynx: Oropharynx is clear.  Eyes:     Extraocular Movements: Extraocular movements intact.     Conjunctiva/sclera: Conjunctivae normal.     Pupils: Pupils are equal, round, and reactive to light.  Cardiovascular:     Rate and Rhythm: Normal rate and regular rhythm.     Pulses: Normal pulses.     Heart sounds: Normal heart sounds.  Pulmonary:     Effort: Pulmonary effort is normal.     Breath sounds: Normal breath sounds.  Abdominal:     General: Abdomen is flat. Bowel sounds are normal.     Palpations: Abdomen is soft.  Musculoskeletal:     Cervical back: Normal range of motion and neck supple.     Comments: Left aka  Skin:    General: Skin is warm.     Capillary Refill: Capillary refill takes less than 2 seconds.  Neurological:     General: No focal deficit present.     Mental Status: He is alert and oriented to person, place, and time.  Psychiatric:        Mood and Affect: Mood normal.        Behavior: Behavior normal.     ED Results / Procedures / Treatments   Labs (all labs ordered are listed, but only abnormal results are displayed) Labs Reviewed  CBG MONITORING, ED - Abnormal; Notable for the following components:      Result Value   Glucose-Capillary 438 (*)    All other components within normal limits  HEPATITIS B SURFACE ANTIGEN  HEPATITIS B  SURFACE ANTIBODY, QUANTITATIVE    EKG None  Radiology No results found.  Procedures Procedures    Medications Ordered in ED Medications  Chlorhexidine Gluconate Cloth 2 % PADS 6 each (has no administration in time range)  sertraline (ZOLOFT) tablet 100 mg (100 mg Oral Given 08/26/23 0900)  Chlorhexidine Gluconate Cloth 2 % PADS 6  each (has no administration in time range)  Darbepoetin Alfa (ARANESP) injection 100 mcg (has no administration in time range)  insulin aspart (novoLOG) injection 80 Units (80 Units Subcutaneous Given 08/26/23 0755)  diazepam (VALIUM) tablet 5 mg (5 mg Oral Given 08/26/23 0755)  oxyCODONE (Oxy IR/ROXICODONE) immediate release tablet 5 mg (5 mg Oral Given 08/26/23 0755)    ED Course/ Medical Decision Making/ A&P                                 Medical Decision Making Risk Prescription drug management.   This patient presents to the ED for concern of needing dialysis, this involves an extensive number of treatment options, and is a complaint that carries with it a high risk of complications and morbidity.  The differential diagnosis includes ESRD on HD   Co morbidities that complicate the patient evaluation  ESRD on HD (Tu, Th, Sat), DM2, HTN, HLD, arthritis, CHF, COPD (home O2 at 2L), CAD, GERD, and depression   Additional history obtained:  Additional history obtained from epic chart review  Lab Tests:  I Ordered, and personally interpreted labs.  The pertinent results include:  cbg 438  Medicines ordered and prescription drug management:  I ordered medication including insulin, zoloft, oxy, valium  for sx  Reevaluation of the patient after these medicines showed that the patient improved I have reviewed the patients home medicines and have made adjustments as needed  Critical Interventions:  dialysis   Consultations Obtained:  I requested consultation with the nephrologist (Dr. Valentino Nose),  and discussed lab and imaging findings as  well as pertinent plan - he will take him to dialysis  Problem List / ED Course:  Hyperglycemia:  pt said he normally takes 140 units of insulin for a bs of 430.  His bs was over 600 at home last night and he took 140 units then.  He is not on any long acting insulin and no oral dm meds.   Last hgb A1c in July was 9.3.  He has not eaten this am.  I spoke with pharmacy.  They recommended starting with 80 units and checking his bs later to see if he needs more. ESRD on HD: to dialysis today.  Anticipate d/c after dialysis.   Reevaluation:  After the interventions noted above, I reevaluated the patient and found that they have :improved   Social Determinants of Health:  Lives at home   Dispostion:  After consideration of the diagnostic results and the patients response to treatment, I feel that the patent would benefit from dialysis, then likely d/c.  He will need to f/u with pcp regarding dm mgmt.          Final Clinical Impression(s) / ED Diagnoses Final diagnoses:  ESRD on hemodialysis (HCC)  Hyperglycemia due to diabetes mellitus [facility])    Rx / DC Orders ED Discharge Orders     None         Jacalyn Lefevre, MD 08/26/23 [phone]

## 2023-08-26 NOTE — ED Triage Notes (Signed)
Patient arrives for routine dialysis. No complaints.

## 2023-08-26 NOTE — Progress Notes (Signed)
Received patient in E.D stretcher bed.Awake,alert and oriented x 4.He signed his consent for treatment.  Access used : Left upper arm AVG that worked well on entire treatment,patient was not complaining of anything.  Left HD Catheter dressing change done today.  Medicine given : Heparin 2,000 units pre-run dose.  Duration of treatment: 3.15 hours.  Fluid removed :1.5 liters as patient requested,Renal MD aware.  Hemo comment: Tolerated treatment:  Hand off to the patient's nurse on stable condition.

## 2023-08-31 ENCOUNTER — Other Ambulatory Visit: Payer: Self-pay

## 2023-08-31 ENCOUNTER — Emergency Department (HOSPITAL_COMMUNITY)
Admission: EM | Admit: 2023-08-31 | Discharge: 2023-08-31 | Discharge status: Against Medical Advice | Payer: 59 | Attending: Emergency Medicine | Admitting: Emergency Medicine

## 2023-08-31 DIAGNOSIS — Z7982 Long term (current) use of aspirin: Secondary | ICD-10-CM | POA: Insufficient documentation

## 2023-08-31 DIAGNOSIS — R600 Localized edema: Secondary | ICD-10-CM | POA: Insufficient documentation

## 2023-08-31 DIAGNOSIS — Z5329 Procedure and treatment not carried out because of patient's decision for other reasons: Secondary | ICD-10-CM | POA: Diagnosis not present

## 2023-08-31 DIAGNOSIS — N186 End stage renal disease: Secondary | ICD-10-CM | POA: Insufficient documentation

## 2023-08-31 DIAGNOSIS — Z794 Long term (current) use of insulin: Secondary | ICD-10-CM | POA: Diagnosis not present

## 2023-08-31 DIAGNOSIS — Z992 Dependence on renal dialysis: Secondary | ICD-10-CM | POA: Diagnosis present

## 2023-08-31 DIAGNOSIS — Z79899 Other long term (current) drug therapy: Secondary | ICD-10-CM | POA: Diagnosis not present

## 2023-08-31 LAB — RENAL FUNCTION PANEL
Albumin: 3.4 g/dL — ABNORMAL LOW (ref 3.5–5.0)
Anion gap: 14 (ref 5–15)
BUN: 85 mg/dL — ABNORMAL HIGH (ref 8–23)
CO2: 26 mmol/L (ref 22–32)
Calcium: 7.8 mg/dL — ABNORMAL LOW (ref 8.9–10.3)
Chloride: 89 mmol/L — ABNORMAL LOW (ref 98–111)
Creatinine, Ser: 5.81 mg/dL — ABNORMAL HIGH (ref 0.61–1.24)
GFR, Estimated: 10 mL/min — ABNORMAL LOW (ref >60)
Glucose, Bld: 419 mg/dL — ABNORMAL HIGH (ref 70–99)
Phosphorus: 4.8 mg/dL — ABNORMAL HIGH (ref 2.5–4.6)
Potassium: 3.9 mmol/L (ref 3.5–5.1)
Sodium: 129 mmol/L — ABNORMAL LOW (ref 135–145)

## 2023-08-31 LAB — CBC
HCT: 26 % — ABNORMAL LOW (ref 39.0–52.0)
Hemoglobin: 8.4 g/dL — ABNORMAL LOW (ref 13.0–17.0)
MCH: 28.3 pg (ref 26.0–34.0)
MCHC: 32.3 g/dL (ref 30.0–36.0)
MCV: 87.5 fL (ref 80.0–100.0)
Platelets: 128 10*3/uL — ABNORMAL LOW (ref 150–400)
RBC: 2.97 MIL/uL — ABNORMAL LOW (ref 4.22–5.81)
RDW: 15.7 % — ABNORMAL HIGH (ref 11.5–15.5)
WBC: 6.5 10*3/uL (ref 4.0–10.5)
nRBC: 0 % (ref 0.0–0.2)

## 2023-08-31 MED ORDER — HEPARIN SODIUM (PORCINE) 1000 UNIT/ML DIALYSIS
2000.0000 [IU] | Freq: Once | INTRAMUSCULAR | Status: AC
  Administered 2023-08-31: 2000 [IU] via INTRAVENOUS_CENTRAL
  Filled 2023-08-31: qty 2

## 2023-08-31 MED ORDER — LIDOCAINE-PRILOCAINE 2.5-2.5 % EX CREA: 1.0000 | TOPICAL_CREAM | CUTANEOUS | Status: DC | PRN

## 2023-08-31 MED ORDER — PENTAFLUOROPROP-TETRAFLUOROETH EX AERO: 1.0000 | INHALATION_SPRAY | CUTANEOUS | Status: DC | PRN

## 2023-08-31 MED ORDER — NEPRO/CARBSTEADY PO LIQD: 237.0000 mL | ORAL | Status: DC | PRN

## 2023-08-31 MED ORDER — LIDOCAINE HCL (PF) 1 % IJ SOLN: 5.0000 mL | INTRAMUSCULAR | Status: DC | PRN

## 2023-08-31 MED ORDER — HEPARIN SODIUM (PORCINE) 1000 UNIT/ML DIALYSIS
1000.0000 [IU] | INTRAMUSCULAR | Status: DC | PRN
  Filled 2023-08-31 (×2): qty 1

## 2023-08-31 MED ORDER — ALTEPLASE 2 MG IJ SOLR: 2.0000 mg | Freq: Once | INTRAMUSCULAR | Status: DC | PRN

## 2023-08-31 MED ORDER — CHLORHEXIDINE GLUCONATE CLOTH 2 % EX PADS: 6.0000 | MEDICATED_PAD | Freq: Every day | CUTANEOUS | Status: DC

## 2023-08-31 MED ORDER — ANTICOAGULANT SODIUM CITRATE 4% (200MG/5ML) IV SOLN: 5.0000 mL | Status: DC | PRN

## 2023-08-31 MED ORDER — DARBEPOETIN ALFA 150 MCG/0.3ML IJ SOSY
150.0000 ug | PREFILLED_SYRINGE | Freq: Once | INTRAMUSCULAR | Status: AC
  Administered 2023-08-31: 150 ug via SUBCUTANEOUS
  Filled 2023-08-31: qty 0.3

## 2023-08-31 NOTE — ED Provider Notes (Signed)
5:31 PM went to check on patient postdialysis, per NT, patient left department soon as he was wheeled down to the ED and is no longer here.   Renne Crigler, PA-C 08/31/23 1732    Laurence Spates, MD 08/31/23 [phone]

## 2023-08-31 NOTE — Procedures (Signed)
We were asked to see this patient for dialysis. Pt was discharged from his OP HD unit in December 2023 due to behavioral issues. He no longer has a home outpatient HD unit. Pt came to ED today requesting hospital dialysis. The plan will be for "ED HD". Patient is not being admitted. Pt will go to the dialysis unit upstairs when they are ready for the pt. Then the pt will get dialysis. When dialysis is completed pt will be sent back to ED for reassessment.      Last OP HD orders (dec 2023):  3:15h  129kg  RUE AVG   Heparin 2000     - last hep B labs done here on --> 08/12/23   - pt typically comes on Tu, Th and Sat for hospital HD   Anemia of esrd:  Pt had darbe in late April and in early May, but Hb continued to drop, so dosing of darbepoeitin was increased up to 100 and then up to 150 mcg q 1-2 wks.  Pt also required two 1 gm IV Fe loading programs during the summer and early fall to get the Hb to respond.    The darbepoeitin needs to ordered for this patient as "once in dialysis" and not "SQ at 1800" because he needs to be ready to leave at 5 pm to catch his ride home.    HD access: new LUE AVG placed 04/07/23 by Dr Edilia Bo. Using AVG now.       Date             Hb         Tsat/ ferritin    Darbe SQ       ferric gluconate 8/03                  10.4 8/10                  10.3                                 150 mcg 8/31                   9.8 9/03                                                            150 mcg  9/14                    9.9      9/17                    9.7          9/24      8.8     10/3      150 mcg sq ordered        I was present at this dialysis session, have reviewed the session and made  appropriate changes Vinson Moselle MD  [facility] 08/31/2023, 1:51 PM

## 2023-08-31 NOTE — ED Provider Notes (Signed)
[place] EMERGENCY DEPARTMENT AT [facility] Provider Note   CSN: [id number] Arrival date & time: 08/31/23  0347     History  Chief Complaint  Patient presents with   Vascular Access Problem    Jeffrey [name] is a 62 y.o. male.  62 year old male with a history of ESRD on Tuesday, Thursday, and Saturday dialysis and chronic hypoxia on oxygen at home who presents to the emergency department with request for dialysis.  Patient has been getting dialysis through the emergency department.  He says that he missed the Tuesday session due to transportation issues.  Denies any shortness of breath, leg swelling, or any other complaints at this time.       Home Medications Prior to Admission medications   Medication Sig Start Date End Date Taking? Authorizing Provider  acetaminophen (TYLENOL) 325 MG tablet Take 2 tablets (650 mg total) by mouth every 6 (six) hours. 07/23/23   Lockie Mola, MD  amLODipine (NORVASC) 10 MG tablet Take 10 mg by mouth every morning. 04/17/20   [provider]  ASPIRIN LOW DOSE 81 MG EC tablet Take 81 mg by mouth in the morning. 02/22/21   [provider]  atorvastatin (LIPITOR) 40 MG tablet Take 40 mg by mouth in the morning. 02/09/21   [provider]  calcium acetate (PHOSLO) 667 MG capsule Take 2 capsules (1,334 mg total) by mouth with breakfast, with lunch, and with evening meal. 04/10/21   Calvert Cantor, MD  Calcium Carbonate Antacid (CALCIUM CARBONATE, DOSED IN MG ELEMENTAL CALCIUM,) 1250 MG/5ML SUSP Take 5 mLs (500 mg of elemental calcium total) by mouth every 6 (six) hours as needed for indigestion. 04/08/23   Rhetta Mura, MD  carvedilol (COREG) 12.5 MG tablet Take 12.5 mg by mouth 2 (two) times daily with a meal. 04/17/20   [provider]  Cholecalciferol (VITAMIN D) 50 MCG (2000 UT) CAPS Take 2,000 Units by mouth daily.    [provider]  clopidogrel (PLAVIX) 75 MG tablet Take 1 tablet (75 mg  total) by mouth daily. 11/02/22   Rhyne, Ames Coupe, PA-C  cyclobenzaprine (FLEXERIL) 10 MG tablet Take 10 mg by mouth 3 (three) times daily as needed for muscle spasms. 12/29/21   [provider]  diazepam (VALIUM) 5 MG tablet Take 5 mg by mouth 2 (two) times daily as needed for muscle spasms. 12/09/21   [provider]  insulin regular human CONCENTRATED (HUMULIN R U-500 KWIKPEN) 500 UNIT/ML KwikPen Inject 45 Units into the skin daily with supper. Patient taking differently: Inject 70-125 Units into the skin 2 (two) times daily with a meal. 09/04/21 08/30/24  [name], MD  nortriptyline (PAMELOR) 10 MG capsule Take 20 mg by mouth at bedtime.    [provider]  oxyCODONE (OXY IR/ROXICODONE) 5 MG immediate release tablet Take 1 tablet (5 mg total) by mouth every 4 (four) hours as needed for severe pain or moderate pain (please give patient scheduled tylenol BEFORE oxycodone). 07/23/23   Lockie Mola, MD  pantoprazole (PROTONIX) 40 MG tablet Take 40 mg by mouth daily before breakfast. 02/09/21   [provider]  pregabalin (LYRICA) 75 MG capsule Take 1 capsule (75 mg total) by mouth See admin instructions. Daily.  Give after dialysis on dialysis days. 01/21/23   [name], MD  sertraline (ZOLOFT) 100 MG tablet Take 100 mg by mouth in the morning. 02/22/21   [provider]  tamsulosin (FLOMAX) 0.4 MG CAPS capsule Take 1 capsule (  0.4 mg total) by mouth daily. 11/10/21   [name], MD  torsemide (DEMADEX) 20 MG tablet Take 120 mg by mouth 2 (two) times daily.    [provider]  traZODone (DESYREL) 150 MG tablet Take 150 mg by mouth at bedtime. 12/09/21   [provider]      Allergies    Actos [pioglitazone], Dexmedetomidine, Ibuprofen, Tomato, and Wellbutrin [bupropion]    Review of Systems   Review of Systems  Physical Exam Updated Vital Signs BP 130/60   Pulse 87   Temp 97.9 F (36.6 C)   Resp 14   SpO2 100%   Physical Exam Vitals and nursing note reviewed.  Constitutional:      General: He is not in acute distress.    Appearance: He is well-developed.     Comments: On home nasal cannula  HENT:     Head: Normocephalic and atraumatic.     Right Ear: External ear normal.     Left Ear: External ear normal.     Nose: Nose normal.  Eyes:     Extraocular Movements: Extraocular movements intact.     Conjunctiva/sclera: Conjunctivae normal.     Pupils: Pupils are equal, round, and reactive to light.  Cardiovascular:     Rate and Rhythm: Normal rate and regular rhythm.     Heart sounds: Normal heart sounds.     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 No respiratory distress.     Breath sounds: Normal breath sounds.  Musculoskeletal:     Cervical back: Normal range of motion and neck supple.     Right lower leg: Edema present.     Comments: Left lower extremity amputation  Skin:    General: Skin is warm and dry.  Neurological:     Mental Status: He is alert. Mental status is at baseline.  Psychiatric:        Mood and Affect: Mood normal.        Behavior: Behavior normal.     ED Results / Procedures / Treatments   Labs (all labs ordered are listed, but only abnormal results are displayed) Labs Reviewed  RENAL FUNCTION PANEL - Abnormal; Notable for the following components:      Result Value   Sodium 129 (*)    Chloride 89 (*)    Glucose, Bld 419 (*)    BUN 85 (*)    Creatinine, Ser 5.81 (*)    Calcium 7.8 (*)    Phosphorus 4.8 (*)    Albumin 3.4 (*)    GFR, Estimated 10 (*)    All other components within normal limits  CBC - Abnormal; Notable for the following components:   RBC 2.97 (*)    Hemoglobin 8.4 (*)    HCT 26.0 (*)    RDW 15.7 (*)    Platelets 128 (*)    All other components within normal limits  IRON AND TIBC    EKG EKG Interpretation Date/Time:  Thursday August 31 2023 08:43:09  EDT Ventricular Rate:  88 PR Interval:  162 QRS Duration:  92 QT Interval:  372 QTC Calculation: 450 R Axis:   71  Text Interpretation: Normal sinus rhythm Normal ECG Confirmed by Vonita Moss [phone]) on 08/31/2023 9:46:35 AM  Radiology No results found.  Procedures Procedures    Medications Ordered in ED Medications  Chlorhexidine Gluconate Cloth 2 % PADS 6 each (has no administration in  time range)  heparin injection 2,000 Units (2,000 Units Dialysis Given 08/31/23 1325)  Darbepoetin Alfa (ARANESP) injection 150 mcg (150 mcg Subcutaneous Given 08/31/23 1509)    ED Course/ Medical Decision Making/ A&P                                 Medical Decision Making Amount and/or Complexity of Data Reviewed Labs: ordered.   Jeffrey [name] is a 62 y.o. male with comorbidities that complicate the patient evaluation including ESRD on Tuesday, Thursday, and Saturday dialysis and chronic hypoxia on oxygen at home who presents to the emergency department with request for dialysis.    Initial Ddx:  Electrolyte abnormality, volume overload, routine dialysis  MDM/Course:  Patient resents emergency department with request for dialysis.  Did miss a session but does not appear to be grossly volume overloaded.  EKG without evidence of hyperkalemia.  Labs were sent by dialysis team which show chronic anemia and CKD with a normal potassium and remainder of his electrolytes at baseline.  Discussed with on-call nephrologist who is arranging for his dialysis.  Should be able to be discharged afterwards if stable.  This patient presents to the ED for concern of complaints listed in HPI, this involves an extensive number of treatment options, and is a complaint that carries with it a high risk of complications and morbidity. Disposition including potential need for admission considered.   Dispo: Pending remainder of workup  Records reviewed Outpatient Clinic Notes The following labs were  independently interpreted: Chemistry and show CKD I personally reviewed and interpreted the pt's EKG: see above for interpretation  I have reviewed the patients home medications and made adjustments as needed Consults: Nephrology  Portions of this note were generated with Dragon dictation software. Dictation errors may occur despite best attempts at proofreading.           Final Clinical Impression(s) / ED Diagnoses Final diagnoses:  ESRD (end stage renal disease) (HCC)    Rx / DC Orders ED Discharge Orders     None         Rondel Baton, MD 08/31/23 1945

## 2023-08-31 NOTE — Progress Notes (Signed)
   08/31/23 1600  Vitals  Temp 97.9 F (36.6 C)  Pulse Rate 87  Resp 14  BP 130/60  SpO2 100 %  O2 Device Nasal Cannula  Weight  (stretcher--unable to weigh)  Type of Weight Post-Dialysis  Oxygen Therapy  O2 Flow Rate (L/min) 3 L/min  Patient Activity (if Appropriate) In bed  Pulse Oximetry Type Continuous  Oximetry Probe Site Changed No  Post Treatment  Dialyzer Clearance Lightly streaked  Hemodialysis Intake (mL) 0 mL  Liters Processed 47.3  Fluid Removed (mL) 1200 mL  Tolerated HD Treatment Yes  Post-Hemodialysis Comments pt signed off ama---Dr. Arlean Hopping sent a note  AVG/AVF Arterial Site Held (minutes) 10 minutes  AVG/AVF Venous Site Held (minutes) 10 minutes  During Treatment Monitoring  Duration of HD Treatment -hour(s) 2.22 hour(s)   Received patient in bed to unit.  Alert and oriented.  Informed consent signed and in chart.   TX duration: 2.13--pt signed off ama---nephrology notified Patient tolerated well.  Transported back to the room  Alert, without acute distress.  Hand-off given to patient's nurse.   Access used: LUAG Access issues: no complications Hd catheter flushed--reinstilled with heparin per order and capped and clamped Total UF removed: 1200 Medication(s) given: aranesp [place] x 1--   [facility]

## 2023-08-31 NOTE — ED Triage Notes (Signed)
Pt here for dialysis. Pt missed on Tuesday due to his transportation. Last treatment on Saturday-completed treatment.

## 2023-09-02 ENCOUNTER — Other Ambulatory Visit: Payer: Self-pay

## 2023-09-02 ENCOUNTER — Emergency Department (HOSPITAL_COMMUNITY)
Admission: EM | Admit: 2023-09-02 | Discharge: 2023-09-02 | Disposition: A | Discharge status: Home / Self Care | Payer: 59 | Attending: Emergency Medicine | Admitting: Emergency Medicine

## 2023-09-02 ENCOUNTER — Encounter (HOSPITAL_COMMUNITY): Payer: Self-pay

## 2023-09-02 DIAGNOSIS — T82598A Other mechanical complication of other cardiac and vascular devices and implants, initial encounter: Secondary | ICD-10-CM | POA: Insufficient documentation

## 2023-09-02 DIAGNOSIS — Z79899 Other long term (current) drug therapy: Secondary | ICD-10-CM | POA: Insufficient documentation

## 2023-09-02 DIAGNOSIS — E1122 Type 2 diabetes mellitus with diabetic chronic kidney disease: Secondary | ICD-10-CM | POA: Diagnosis not present

## 2023-09-02 DIAGNOSIS — I120 Hypertensive chronic kidney disease with stage 5 chronic kidney disease or end stage renal disease: Secondary | ICD-10-CM | POA: Insufficient documentation

## 2023-09-02 DIAGNOSIS — N186 End stage renal disease: Secondary | ICD-10-CM | POA: Diagnosis not present

## 2023-09-02 DIAGNOSIS — Y732 Prosthetic and other implants, materials and accessory gastroenterology and urology devices associated with adverse incidents: Secondary | ICD-10-CM | POA: Insufficient documentation

## 2023-09-02 DIAGNOSIS — Z992 Dependence on renal dialysis: Secondary | ICD-10-CM | POA: Insufficient documentation

## 2023-09-02 DIAGNOSIS — Z7982 Long term (current) use of aspirin: Secondary | ICD-10-CM | POA: Diagnosis not present

## 2023-09-02 DIAGNOSIS — Z794 Long term (current) use of insulin: Secondary | ICD-10-CM | POA: Diagnosis not present

## 2023-09-02 MED ORDER — CHLORHEXIDINE GLUCONATE CLOTH 2 % EX PADS: 6.0000 | MEDICATED_PAD | Freq: Every day | CUTANEOUS | Status: DC

## 2023-09-02 NOTE — ED Provider Notes (Signed)
[place] EMERGENCY DEPARTMENT AT [facility] Provider Note   CSN: [id number] Arrival date & time: 09/02/23  0741     History  Chief Complaint  Patient presents with   Vascular Access Problem    Jeffrey [name] is a 62 y.o. male.  Patient is a 62 year old male with a past medical history of ESRD on TTS HD without home dialysis center, diabetes and hypertension presented to the emergency department for regularly scheduled dialysis session today.  He states that he last had dialysis on Thursday.  He states he has no new complaints today with no shortness of breath.  The history is provided by the patient.       Home Medications Prior to Admission medications   Medication Sig Start Date End Date Taking? Authorizing Provider  acetaminophen (TYLENOL) 325 MG tablet Take 2 tablets (650 mg total) by mouth every 6 (six) hours. 07/23/23   Lockie Mola, MD  amLODipine (NORVASC) 10 MG tablet Take 10 mg by mouth every morning. 04/17/20   [provider]  ASPIRIN LOW DOSE 81 MG EC tablet Take 81 mg by mouth in the morning. 02/22/21   [provider]  atorvastatin (LIPITOR) 40 MG tablet Take 40 mg by mouth in the morning. 02/09/21   [provider]  calcium acetate (PHOSLO) 667 MG capsule Take 2 capsules (1,334 mg total) by mouth with breakfast, with lunch, and with evening meal. 04/10/21   Calvert Cantor, MD  Calcium Carbonate Antacid (CALCIUM CARBONATE, DOSED IN MG ELEMENTAL CALCIUM,) 1250 MG/5ML SUSP Take 5 mLs (500 mg of elemental calcium total) by mouth every 6 (six) hours as needed for indigestion. 04/08/23   Rhetta Mura, MD  carvedilol (COREG) 12.5 MG tablet Take 12.5 mg by mouth 2 (two) times daily with a meal. 04/17/20   [provider]  Cholecalciferol (VITAMIN D) 50 MCG (2000 UT) CAPS Take 2,000 Units by mouth daily.    [provider]  clopidogrel (PLAVIX) 75 MG tablet Take 1 tablet (75 mg total) by mouth daily. 11/02/22   Rhyne,  Ames Coupe, PA-C  cyclobenzaprine (FLEXERIL) 10 MG tablet Take 10 mg by mouth 3 (three) times daily as needed for muscle spasms. 12/29/21   [provider]  diazepam (VALIUM) 5 MG tablet Take 5 mg by mouth 2 (two) times daily as needed for muscle spasms. 12/09/21   [provider]  insulin regular human CONCENTRATED (HUMULIN R U-500 KWIKPEN) 500 UNIT/ML KwikPen Inject 45 Units into the skin daily with supper. Patient taking differently: Inject 70-125 Units into the skin 2 (two) times daily with a meal. 09/04/21 08/30/24  [name], MD  nortriptyline (PAMELOR) 10 MG capsule Take 20 mg by mouth at bedtime.    [provider]  oxyCODONE (OXY IR/ROXICODONE) 5 MG immediate release tablet Take 1 tablet (5 mg total) by mouth every 4 (four) hours as needed for severe pain or moderate pain (please give patient scheduled tylenol BEFORE oxycodone). 07/23/23   Lockie Mola, MD  pantoprazole (PROTONIX) 40 MG tablet Take 40 mg by mouth daily before breakfast. 02/09/21   [provider]  pregabalin (LYRICA) 75 MG capsule Take 1 capsule (75 mg total) by mouth See admin instructions. Daily.  Give after dialysis on dialysis days. 01/21/23   [name], MD  sertraline (ZOLOFT) 100 MG tablet Take 100 mg by mouth in the morning. 02/22/21   [provider]  tamsulosin (FLOMAX) 0.4 MG CAPS capsule Take 1 capsule (0.4 mg total)  by mouth daily. 11/10/21   [name], MD  torsemide (DEMADEX) 20 MG tablet Take 120 mg by mouth 2 (two) times daily.    [provider]  traZODone (DESYREL) 150 MG tablet Take 150 mg by mouth at bedtime. 12/09/21   [provider]      Allergies    Actos [pioglitazone], Dexmedetomidine, Ibuprofen, Tomato, and Wellbutrin [bupropion]    Review of Systems   Review of Systems  Physical Exam Updated Vital Signs BP (!) 142/56 (BP Location: Right Wrist)   Pulse 89   Temp 98.2 F (36.8 C) (Oral)   Resp 14   SpO2 100%  Physical  Exam Vitals and nursing note reviewed.  Constitutional:      General: He is not in acute distress.    Appearance: Normal appearance. He is obese.  HENT:     Head: Normocephalic and atraumatic.     Nose: Nose normal.     Mouth/Throat:     Mouth: Mucous membranes are moist.  Eyes:     Extraocular Movements: Extraocular movements intact.  Cardiovascular:     Rate and Rhythm: Normal rate and regular rhythm.     Heart sounds: Normal heart sounds.  Pulmonary:     Effort: Pulmonary effort is normal.     Breath sounds: Normal breath sounds.  Abdominal:     General: Abdomen is flat.  Musculoskeletal:        General: Normal range of motion.     Cervical back: Normal range of motion.     Comments: Sitting in wheelchair  Skin:    General: Skin is warm and dry.  Neurological:     Mental Status: He is alert and oriented to person, place, and time.  Psychiatric:        Mood and Affect: Mood normal.        Behavior: Behavior normal.     ED Results / Procedures / Treatments   Labs (all labs ordered are listed, but only abnormal results are displayed) Labs Reviewed - No data to display  EKG None  Radiology No results found.  Procedures Procedures    Medications Ordered in ED Medications  Chlorhexidine Gluconate Cloth 2 % PADS 6 each (has no administration in time range)    ED Course/ Medical Decision Making/ A&P Clinical Course as of 09/02/23 1525  Sat Sep 02, 2023  1524 Patient at dialysis. Signed out to oncoming team pending reassessment after HD. [VK]    Clinical Course User Index [VK] Rexford Maus, DO                                 Medical Decision Making This patient presents to the ED with chief complaint(s) of needing HD with pertinent past medical history of  ESRD on TTS HD without home dialysis center, diabetes and hypertension which further complicates the presenting complaint. The complaint involves an extensive differential diagnosis and also  carries with it a high risk of complications and morbidity.    The differential diagnosis includes no signs of significant pulmonary edema or respiratory distress, has not missed any dialysis sessions so unlikely to have severe electrolyte derangement  Additional history obtained: Additional history obtained from N/A Records reviewed multiple recent ED records  ED Course and Reassessment: On patient's arrival he is hemodynamically stable with no new complaints. Will consult nephrology to facilitate dialysis today.  Independent labs interpretation:  N/A  Independent  visualization of imaging: - N/A  Consultation: - Consulted or discussed management/test interpretation w/ external professional: nephrology             Final Clinical Impression(s) / ED Diagnoses Final diagnoses:  ESRD on dialysis [facility])    Rx / DC Orders ED Discharge Orders     None         Rexford Maus, DO 09/02/23 1525

## 2023-09-02 NOTE — ED Notes (Signed)
PT left prior to receiving dialysis and prior to obtaining last set of vitals.

## 2023-09-02 NOTE — ED Triage Notes (Signed)
Pt here for dialysis, received full treatment on Thursday. No complaints

## 2023-09-02 NOTE — Discharge Instructions (Signed)
You were seen in the emergency department for your dialysis. You should continue to receive dialysis as scheduled every Tuesday, Thursday and Saturday. You can follow up with your primary doctor as needed. Return to the emergency department sooner for any new or concerning symptoms.

## 2023-09-05 ENCOUNTER — Other Ambulatory Visit: Payer: Self-pay

## 2023-09-05 ENCOUNTER — Emergency Department (HOSPITAL_COMMUNITY)
Admission: EM | Admit: 2023-09-05 | Discharge: 2023-09-05 | Discharge status: Against Medical Advice | Payer: 59 | Attending: Emergency Medicine | Admitting: Emergency Medicine

## 2023-09-05 DIAGNOSIS — Z7901 Long term (current) use of anticoagulants: Secondary | ICD-10-CM | POA: Diagnosis not present

## 2023-09-05 DIAGNOSIS — Z992 Dependence on renal dialysis: Secondary | ICD-10-CM | POA: Diagnosis not present

## 2023-09-05 DIAGNOSIS — E1122 Type 2 diabetes mellitus with diabetic chronic kidney disease: Secondary | ICD-10-CM | POA: Insufficient documentation

## 2023-09-05 DIAGNOSIS — Z794 Long term (current) use of insulin: Secondary | ICD-10-CM | POA: Insufficient documentation

## 2023-09-05 DIAGNOSIS — I120 Hypertensive chronic kidney disease with stage 5 chronic kidney disease or end stage renal disease: Secondary | ICD-10-CM | POA: Diagnosis not present

## 2023-09-05 DIAGNOSIS — Z79899 Other long term (current) drug therapy: Secondary | ICD-10-CM | POA: Diagnosis not present

## 2023-09-05 DIAGNOSIS — N186 End stage renal disease: Secondary | ICD-10-CM | POA: Insufficient documentation

## 2023-09-05 MED ORDER — CHLORHEXIDINE GLUCONATE CLOTH 2 % EX PADS: 6.0000 | MEDICATED_PAD | Freq: Every day | CUTANEOUS | Status: DC

## 2023-09-05 NOTE — ED Provider Notes (Addendum)
[place] EMERGENCY DEPARTMENT AT [facility] Provider Note   CSN: [id number] Arrival date & time: 09/05/23  4098     History  No chief complaint on file.   Jeffrey [name] is a 62 y.o. male.  62 year old male with PMH of ESRD on dialysis on T/TH/S schedule last session on Thursday.  Denies any symptoms.        Home Medications Prior to Admission medications   Medication Sig Start Date End Date Taking? Authorizing Provider  acetaminophen (TYLENOL) 325 MG tablet Take 2 tablets (650 mg total) by mouth every 6 (six) hours. 07/23/23   Lockie Mola, MD  amLODipine (NORVASC) 10 MG tablet Take 10 mg by mouth every morning. 04/17/20   [provider]  ASPIRIN LOW DOSE 81 MG EC tablet Take 81 mg by mouth in the morning. 02/22/21   [provider]  atorvastatin (LIPITOR) 40 MG tablet Take 40 mg by mouth in the morning. 02/09/21   [provider]  calcium acetate (PHOSLO) 667 MG capsule Take 2 capsules (1,334 mg total) by mouth with breakfast, with lunch, and with evening meal. 04/10/21   Calvert Cantor, MD  Calcium Carbonate Antacid (CALCIUM CARBONATE, DOSED IN MG ELEMENTAL CALCIUM,) 1250 MG/5ML SUSP Take 5 mLs (500 mg of elemental calcium total) by mouth every 6 (six) hours as needed for indigestion. 04/08/23   Rhetta Mura, MD  carvedilol (COREG) 12.5 MG tablet Take 12.5 mg by mouth 2 (two) times daily with a meal. 04/17/20   [provider]  Cholecalciferol (VITAMIN D) 50 MCG (2000 UT) CAPS Take 2,000 Units by mouth daily.    [provider]  clopidogrel (PLAVIX) 75 MG tablet Take 1 tablet (75 mg total) by mouth daily. 11/02/22   Rhyne, Ames Coupe, PA-C  cyclobenzaprine (FLEXERIL) 10 MG tablet Take 10 mg by mouth 3 (three) times daily as needed for muscle spasms. 12/29/21   [provider]  diazepam (VALIUM) 5 MG tablet Take 5 mg by mouth 2 (two) times daily as needed for muscle spasms. 12/09/21   [provider]   insulin regular human CONCENTRATED (HUMULIN R U-500 KWIKPEN) 500 UNIT/ML KwikPen Inject 45 Units into the skin daily with supper. Patient taking differently: Inject 70-125 Units into the skin 2 (two) times daily with a meal. 09/04/21 08/30/24  [name], MD  nortriptyline (PAMELOR) 10 MG capsule Take 20 mg by mouth at bedtime.    [provider]  oxyCODONE (OXY IR/ROXICODONE) 5 MG immediate release tablet Take 1 tablet (5 mg total) by mouth every 4 (four) hours as needed for severe pain or moderate pain (please give patient scheduled tylenol BEFORE oxycodone). 07/23/23   Lockie Mola, MD  pantoprazole (PROTONIX) 40 MG tablet Take 40 mg by mouth daily before breakfast. 02/09/21   [provider]  pregabalin (LYRICA) 75 MG capsule Take 1 capsule (75 mg total) by mouth See admin instructions. Daily.  Give after dialysis on dialysis days. 01/21/23   [name], MD  sertraline (ZOLOFT) 100 MG tablet Take 100 mg by mouth in the morning. 02/22/21   [provider]  tamsulosin (FLOMAX) 0.4 MG CAPS capsule Take 1 capsule (0.4 mg total) by mouth daily. 11/10/21   [name], MD  torsemide (DEMADEX) 20 MG tablet Take 120 mg by mouth 2 (two) times daily.    [provider]  traZODone (DESYREL) 150 MG tablet Take 150 mg by mouth at bedtime. 12/09/21   [provider]  Allergies    Actos [pioglitazone], Dexmedetomidine, Ibuprofen, Tomato, and Wellbutrin [bupropion]    Review of Systems   Review of Systems  Constitutional:  Negative for chills and fever.  Respiratory:  Negative for shortness of breath.   Cardiovascular:  Negative for chest pain.  All other systems reviewed and are negative.   Physical Exam Updated Vital Signs BP (!) 155/58 (BP Location: Right Arm)   Pulse 91   Temp 98.2 F (36.8 C)   Resp 18   SpO2 100%  Physical Exam Vitals and nursing note reviewed.  Constitutional:      General: He is not in acute distress.     Appearance: Normal appearance. He is not ill-appearing.  HENT:     Head: Normocephalic and atraumatic.     Nose: Nose normal.  Eyes:     Conjunctiva/sclera: Conjunctivae normal.  Cardiovascular:     Rate and Rhythm: Normal rate.  Pulmonary:     Effort: Pulmonary effort is normal. No respiratory distress.  Musculoskeletal:        General: No deformity. Normal range of motion.     Cervical back: Normal range of motion.  Skin:    Findings: No rash.  Neurological:     Mental Status: He is alert.     ED Results / Procedures / Treatments   Labs (all labs ordered are listed, but only abnormal results are displayed) Labs Reviewed - No data to display  EKG None  Radiology No results found.  Procedures Procedures    Medications Ordered in ED Medications  Chlorhexidine Gluconate Cloth 2 % PADS 6 each (has no administration in time range)    ED Course/ Medical Decision Making/ A&P                                 Medical Decision Making  62 year old male presents today to the ED needing hemodialysis.  Past medical history of ESRD on TTS HD schedule.  He does not have a home dialysis center.  He does have history of hypertension, diabetes.  No evidence of significant pulmonary edema or respiratory distress.  Recently had blood work done on 10/3.  No significant instability on labs at that time.  With the exception of sodium of 129 which is uptrending from previous labs.  Discussed with nephrology who will set up dialysis. Increased wait times in the ED.  Patient was seen in triage to help facilitate starting the process to get dialysis set up while patient waits for room.  Final Clinical Impression(s) / ED Diagnoses Final diagnoses:  ESRD (end stage renal disease) [facility])    Rx / DC Orders ED Discharge Orders     None         Marita [place], PA-C 09/05/23 0901    Marita [place], PA-C 09/05/23 4098    Jacalyn Lefevre, MD 09/05/23 1191    Jacalyn Lefevre, MD 09/05/23  1505

## 2023-09-05 NOTE — ED Notes (Signed)
Dialysis RN notified that pt is waiting in ED lobby and that orders for today's HD have been placed

## 2023-09-05 NOTE — ED Notes (Signed)
Pt stated they cannot get him in for dialysis today and he is leaving. Moved OTF.

## 2023-09-05 NOTE — ED Triage Notes (Signed)
Patient arrives for dialysis. Last treatment Thursday, as time constraints kept him from receiving it here on Saturday.

## 2023-09-05 NOTE — Progress Notes (Signed)
Received call from hospital staff with request that DSS be called for transportation appt for tomorrow since pt unable to receive HD today. Contacted DSS to request/arrange 7:00 am pick up for tomorrow. Pt made aware of this info and agreeable.   Olivia Canter Renal Navigator [phone]

## 2023-09-06 ENCOUNTER — Other Ambulatory Visit: Payer: Self-pay

## 2023-09-06 ENCOUNTER — Emergency Department (HOSPITAL_COMMUNITY)
Admission: EM | Admit: 2023-09-06 | Discharge: 2023-09-06 | Disposition: A | Discharge status: Against Medical Advice | Payer: 59 | Attending: Nephrology | Admitting: Nephrology

## 2023-09-06 DIAGNOSIS — Z5321 Procedure and treatment not carried out due to patient leaving prior to being seen by health care provider: Secondary | ICD-10-CM | POA: Insufficient documentation

## 2023-09-06 DIAGNOSIS — M25532 Pain in left wrist: Secondary | ICD-10-CM | POA: Insufficient documentation

## 2023-09-06 DIAGNOSIS — Z992 Dependence on renal dialysis: Secondary | ICD-10-CM | POA: Insufficient documentation

## 2023-09-06 DIAGNOSIS — N186 End stage renal disease: Secondary | ICD-10-CM | POA: Insufficient documentation

## 2023-09-06 MED ORDER — HEPARIN SODIUM (PORCINE) 1000 UNIT/ML IJ SOLN
INTRAMUSCULAR | Status: AC
  Filled 2023-09-06: qty 2

## 2023-09-06 MED ORDER — HEPARIN SODIUM (PORCINE) 1000 UNIT/ML IJ SOLN
INTRAMUSCULAR | Status: AC
  Filled 2023-09-06: qty 4

## 2023-09-06 MED ORDER — CHLORHEXIDINE GLUCONATE CLOTH 2 % EX PADS: 6.0000 | MEDICATED_PAD | Freq: Every day | CUTANEOUS | Status: DC

## 2023-09-06 NOTE — ED Notes (Signed)
Dialysis called and said they were coming to get patient for dialysis.

## 2023-09-06 NOTE — Progress Notes (Signed)
   09/06/23 1159  Vitals  Temp 98.1 F (36.7 C)  Pulse Rate 86  Resp 14  BP (!) 158/74  SpO2 100 %  O2 Device Nasal Cannula  Weight  (unable to obtain, RN aware)  Type of Weight Post-Dialysis  Oxygen Therapy  O2 Flow Rate (L/min) 2 L/min  Patient Activity (if Appropriate) In bed  Pulse Oximetry Type Continuous  Oximetry Probe Site Changed No  Post Treatment  Dialyzer Clearance Lightly streaked  Hemodialysis Intake (mL) 0 mL  Liters Processed 77.9  Fluid Removed (mL) 2000 mL  Tolerated HD Treatment Yes  AVG/AVF Arterial Site Held (minutes) 10 minutes  AVG/AVF Venous Site Held (minutes) 10 minutes  During Treatment Monitoring  Duration of HD Treatment -hour(s) 3 hour(s)   Received patient in bed to unit.  Alert and oriented.  Informed consent signed and in chart.   TX duration:3hrs  Patient tolerated well.  Transported back to the room  Alert, without acute distress.  Hand-off given to patient's nurse.   Access used: LAVG Access issues: none  Total UF removed: 2L Medication(s) given: none    Jeffrey [name] [facility]

## 2023-09-06 NOTE — ED Triage Notes (Addendum)
Pt. Due for dialysis, has not been able to have dialysis because of  the volume . Pt stated I had to leave all times because of the time for his ride. He has not had dialysis since last Thursday. Pt stated, Im still having bad pain in left wrist. Pt is wearing a wrist splint. My neck is also really bothering me with pain

## 2023-09-09 ENCOUNTER — Other Ambulatory Visit: Payer: Self-pay

## 2023-09-09 ENCOUNTER — Emergency Department (HOSPITAL_COMMUNITY)
Admission: EM | Admit: 2023-09-09 | Discharge: 2023-09-09 | Discharge status: Against Medical Advice | Payer: 59 | Attending: Nephrology | Admitting: Nephrology

## 2023-09-09 ENCOUNTER — Encounter (HOSPITAL_COMMUNITY): Payer: Self-pay

## 2023-09-09 DIAGNOSIS — D631 Anemia in chronic kidney disease: Secondary | ICD-10-CM | POA: Insufficient documentation

## 2023-09-09 DIAGNOSIS — R000 Tachycardia, unspecified: Secondary | ICD-10-CM | POA: Insufficient documentation

## 2023-09-09 DIAGNOSIS — Z794 Long term (current) use of insulin: Secondary | ICD-10-CM | POA: Insufficient documentation

## 2023-09-09 DIAGNOSIS — Z992 Dependence on renal dialysis: Secondary | ICD-10-CM | POA: Diagnosis not present

## 2023-09-09 DIAGNOSIS — N186 End stage renal disease: Secondary | ICD-10-CM | POA: Diagnosis present

## 2023-09-09 LAB — RENAL FUNCTION PANEL
Albumin: 3.4 g/dL — ABNORMAL LOW (ref 3.5–5.0)
Anion gap: 18 — ABNORMAL HIGH (ref 5–15)
BUN: 100 mg/dL — ABNORMAL HIGH (ref 8–23)
CO2: 26 mmol/L (ref 22–32)
Calcium: 7.7 mg/dL — ABNORMAL LOW (ref 8.9–10.3)
Chloride: 88 mmol/L — ABNORMAL LOW (ref 98–111)
Creatinine, Ser: 6.43 mg/dL — ABNORMAL HIGH (ref 0.61–1.24)
GFR, Estimated: 9 mL/min — ABNORMAL LOW (ref >60)
Glucose, Bld: 390 mg/dL — ABNORMAL HIGH (ref 70–99)
Phosphorus: 4.6 mg/dL (ref 2.5–4.6)
Potassium: 3.7 mmol/L (ref 3.5–5.1)
Sodium: 132 mmol/L — ABNORMAL LOW (ref 135–145)

## 2023-09-09 LAB — CBC
HCT: 24.5 % — ABNORMAL LOW (ref 39.0–52.0)
Hemoglobin: 8.1 g/dL — ABNORMAL LOW (ref 13.0–17.0)
MCH: 28.7 pg (ref 26.0–34.0)
MCHC: 33.1 g/dL (ref 30.0–36.0)
MCV: 86.9 fL (ref 80.0–100.0)
Platelets: 123 10*3/uL — ABNORMAL LOW (ref 150–400)
RBC: 2.82 MIL/uL — ABNORMAL LOW (ref 4.22–5.81)
RDW: 15.2 % (ref 11.5–15.5)
WBC: 7.7 10*3/uL (ref 4.0–10.5)
nRBC: 0 % (ref 0.0–0.2)

## 2023-09-09 LAB — HEPATITIS B SURFACE ANTIGEN: Hepatitis B Surface Ag: NONREACTIVE

## 2023-09-09 MED ORDER — NEPRO/CARBSTEADY PO LIQD
237.0000 mL | ORAL | Status: DC | PRN
  Filled 2023-09-09: qty 237

## 2023-09-09 MED ORDER — DARBEPOETIN ALFA 200 MCG/0.4ML IJ SOSY
200.0000 ug | PREFILLED_SYRINGE | Freq: Once | INTRAMUSCULAR | Status: AC
  Administered 2023-09-09: 200 ug via SUBCUTANEOUS
  Filled 2023-09-09: qty 0.4

## 2023-09-09 MED ORDER — HEPARIN SODIUM (PORCINE) 1000 UNIT/ML DIALYSIS: 1000.0000 [IU] | INTRAMUSCULAR | Status: DC | PRN

## 2023-09-09 MED ORDER — HEPARIN SODIUM (PORCINE) 1000 UNIT/ML DIALYSIS
2000.0000 [IU] | Freq: Once | INTRAMUSCULAR | Status: AC
  Administered 2023-09-09: 2000 [IU] via INTRAVENOUS_CENTRAL
  Filled 2023-09-09: qty 2

## 2023-09-09 MED ORDER — PENTAFLUOROPROP-TETRAFLUOROETH EX AERO
1.0000 | INHALATION_SPRAY | CUTANEOUS | Status: DC | PRN
  Filled 2023-09-09: qty 116

## 2023-09-09 MED ORDER — LIDOCAINE-PRILOCAINE 2.5-2.5 % EX CREA: 1.0000 | TOPICAL_CREAM | CUTANEOUS | Status: DC | PRN

## 2023-09-09 MED ORDER — ANTICOAGULANT SODIUM CITRATE 4% (200MG/5ML) IV SOLN
5.0000 mL | Status: DC | PRN
  Filled 2023-09-09: qty 5

## 2023-09-09 MED ORDER — CHLORHEXIDINE GLUCONATE CLOTH 2 % EX PADS: 6.0000 | MEDICATED_PAD | Freq: Every day | CUTANEOUS | Status: DC

## 2023-09-09 MED ORDER — LIDOCAINE HCL (PF) 1 % IJ SOLN: 5.0000 mL | INTRAMUSCULAR | Status: DC | PRN

## 2023-09-09 MED ORDER — ALTEPLASE 2 MG IJ SOLR: 2.0000 mg | Freq: Once | INTRAMUSCULAR | Status: DC | PRN

## 2023-09-09 NOTE — ED Provider Notes (Signed)
[place] EMERGENCY DEPARTMENT AT [facility] Provider Note   CSN: [id number] Arrival date & time: 09/09/23  4098     History  No chief complaint on file.   Jeffrey [name] is a 62 y.o. male.  HPI 62 year old male on dialysis presents today with complaint of needs to be dialyzed.  Last dialysis on Wednesday.  He denies chest pain, dyspnea, fever, chills.     Home Medications Prior to Admission medications   Medication Sig Start Date End Date Taking? Authorizing Provider  acetaminophen (TYLENOL) 325 MG tablet Take 2 tablets (650 mg total) by mouth every 6 (six) hours. 07/23/23   Lockie Mola, MD  amLODipine (NORVASC) 10 MG tablet Take 10 mg by mouth every morning. 04/17/20   [provider]  ASPIRIN LOW DOSE 81 MG EC tablet Take 81 mg by mouth in the morning. 02/22/21   [provider]  atorvastatin (LIPITOR) 40 MG tablet Take 40 mg by mouth in the morning. 02/09/21   [provider]  calcium acetate (PHOSLO) 667 MG capsule Take 2 capsules (1,334 mg total) by mouth with breakfast, with lunch, and with evening meal. 04/10/21   Calvert Cantor, MD  Calcium Carbonate Antacid (CALCIUM CARBONATE, DOSED IN MG ELEMENTAL CALCIUM,) 1250 MG/5ML SUSP Take 5 mLs (500 mg of elemental calcium total) by mouth every 6 (six) hours as needed for indigestion. 04/08/23   Rhetta Mura, MD  carvedilol (COREG) 12.5 MG tablet Take 12.5 mg by mouth 2 (two) times daily with a meal. 04/17/20   [provider]  Cholecalciferol (VITAMIN D) 50 MCG (2000 UT) CAPS Take 2,000 Units by mouth daily.    [provider]  clopidogrel (PLAVIX) 75 MG tablet Take 1 tablet (75 mg total) by mouth daily. 11/02/22   Rhyne, Ames Coupe, PA-C  cyclobenzaprine (FLEXERIL) 10 MG tablet Take 10 mg by mouth 3 (three) times daily as needed for muscle spasms. 12/29/21   [provider]  diazepam (VALIUM) 5 MG tablet Take 5 mg by mouth 2 (two) times daily as needed for muscle  spasms. 12/09/21   [provider]  insulin regular human CONCENTRATED (HUMULIN R U-500 KWIKPEN) 500 UNIT/ML KwikPen Inject 45 Units into the skin daily with supper. Patient taking differently: Inject 70-125 Units into the skin 2 (two) times daily with a meal. 09/04/21 08/30/24  [name], MD  nortriptyline (PAMELOR) 10 MG capsule Take 20 mg by mouth at bedtime.    [provider]  oxyCODONE (OXY IR/ROXICODONE) 5 MG immediate release tablet Take 1 tablet (5 mg total) by mouth every 4 (four) hours as needed for severe pain or moderate pain (please give patient scheduled tylenol BEFORE oxycodone). 07/23/23   Lockie Mola, MD  pantoprazole (PROTONIX) 40 MG tablet Take 40 mg by mouth daily before breakfast. 02/09/21   [provider]  pregabalin (LYRICA) 75 MG capsule Take 1 capsule (75 mg total) by mouth See admin instructions. Daily.  Give after dialysis on dialysis days. 01/21/23   [name], MD  sertraline (ZOLOFT) 100 MG tablet Take 100 mg by mouth in the morning. 02/22/21   [provider]  tamsulosin (FLOMAX) 0.4 MG CAPS capsule Take 1 capsule (0.4 mg total) by mouth daily. 11/10/21   [name], MD  torsemide (DEMADEX) 20 MG tablet Take 120 mg by mouth 2 (two) times daily.    [provider]  traZODone (DESYREL) 150 MG tablet Take 150 mg by mouth at bedtime. 12/09/21   [provider]      Allergies    Actos [pioglitazone], Dexmedetomidine, Ibuprofen, Tomato, and Wellbutrin [bupropion]    Review of Systems   Review of Systems  Physical Exam Updated Vital Signs BP (!) 154/67   Pulse (!) 103   Temp 98.6 F (37 C) (Oral)   Resp 16   SpO2 93%  Physical Exam Vitals reviewed.  Constitutional:      Appearance: He is obese.  HENT:     Head: Normocephalic.     Left Ear: External ear normal.     Nose: Nose normal.     Mouth/Throat:     Pharynx: Oropharynx is clear.  Cardiovascular:     Rate and Rhythm: Tachycardia present.      Comments: Chest wall with dialysis catheter in place Pulmonary:     Effort: Pulmonary effort is normal.     Breath sounds: Normal breath sounds.  Abdominal:     Palpations: Abdomen is soft.  Musculoskeletal:     Cervical back: Normal range of motion and neck supple.     Comments: Left AKA is present Dialysis site left upper extremity  Neurological:     Mental Status: He is alert.     ED Results / Procedures / Treatments   Labs (all labs ordered are listed, but only abnormal results are displayed) Labs Reviewed  HEPATITIS B SURFACE ANTIGEN  HEPATITIS B SURFACE ANTIBODY, QUANTITATIVE    EKG None  Radiology No results found.  Procedures Procedures    Medications Ordered in ED Medications  Chlorhexidine Gluconate Cloth 2 % PADS 6 each (has no administration in time range)  Darbepoetin Alfa (ARANESP) injection 200 mcg (has no administration in time range)    ED Course/ Medical Decision Making/ A&P                                 Medical Decision Making  62 year old male presents today for dialysis. Discussed with Dr. Arta Silence and will have dialysis        Final Clinical Impression(s) / ED Diagnoses Final diagnoses:  ESRD (end stage renal disease) (HCC)  Encounter for dialysis [facility])    Rx / DC Orders ED Discharge Orders     None         Margarita Grizzle, MD 09/09/23 [phone]

## 2023-09-09 NOTE — ED Triage Notes (Signed)
Needs dialysis, last session was Wednesday. No complaints

## 2023-09-09 NOTE — Procedures (Signed)
I was present at this dialysis session, have reviewed the session and made  appropriate changes Vinson Moselle MD  [facility] 09/09/2023, 12:28 PM

## 2023-09-09 NOTE — Progress Notes (Signed)
Received patient in bed to unit.  Alert and oriented.  Informed consent signed and in chart.   TX duration:3.25  Patient tolerated well.  Transported back to the room  Alert, without acute distress.  Hand-off given to patient's nurse.   Access used: left AVG Access issues: none  Total UF removed: 3L Medication(s) given: arenesp   09/09/23 1545  Vitals  Temp (!) 96.8 F (36 C)  BP 110/71  MAP (mmHg) 82  BP Location Right Wrist  BP Method Automatic  Patient Position (if appropriate) Lying  Pulse Rate 92  Pulse Rate Source Monitor  ECG Heart Rate 94  Resp 15  Oxygen Therapy  SpO2 100 %  O2 Device Nasal Cannula  O2 Flow Rate (L/min) 3 L/min  During Treatment Monitoring  HD Safety Checks Performed Yes  Intra-Hemodialysis Comments Tx completed;Tolerated well  Dialysis Fluid Bolus Normal Saline  Bolus Amount (mL) 300 mL      Jeffrey [name] [facility]

## 2023-09-09 NOTE — Progress Notes (Addendum)
We were asked to see this patient for dialysis. Pt was discharged from his OP HD unit in December 2023 due to behavioral issues. He no longer has a home outpatient HD unit. Pt came to ED today requesting hospital dialysis. The plan will be for "ED HD". Patient is not being admitted. Pt will go to the dialysis unit upstairs when they are ready for the pt. Then the pt will get dialysis. When dialysis is completed pt will be sent back to ED for reassessment.      Last OP HD orders (dec 2023):  3:15h  129kg  RUE AVG   Heparin 2000     - last hep B labs done here on --> 08/12/23   - pt typically comes on Tu, Th and Sat for hospital HD  Problems: Anemia of esrd:  Pt had darbe in late April and in early May, but Hb continued to drop, so dosing of darbepoeitin was increased up to 100 and then up to 150 mcg q 1-2 wks.  Pt also required two 1 gm IV Fe loading programs during the summer and early fall to get the Hb to respond. In Sept / Oct 2024, Hb has been dropping down back into the 8's. Plan: Cont darbepoetin w/ ^ to 200 mcg every 1-2 wks in hospital (pt does not have outpatient unit to get this medication). Will give darbe sq today in HD.  Darbepoeitin needs to ordered for this patient as "once in dialysis" and not "SQ at 1800" because he needs to be ready to leave at 5 pm to catch his ride home.    HD access: new LUE AVG placed 04/07/23 by Dr Edilia Bo. Using AVG now.   Date             Hb         Tsat/ ferritin    Darbe SQ       ferric gluconate 8/10                  10.3                          150 mcg 8/31                   9.8 9/03                                                     150 mcg  9/14                    9.9      9/17                    9.7          9/24                     8.8                            10/3                     8.4  150 mcg    10/12        200 mcg    I was present at this dialysis session, have reviewed the session and made   appropriate changes Vinson Moselle MD  [facility] 09/09/2023, 12:29 PM

## 2023-09-12 ENCOUNTER — Telehealth: Payer: Self-pay

## 2023-09-12 ENCOUNTER — Encounter (HOSPITAL_COMMUNITY): Payer: Self-pay | Admitting: Emergency Medicine

## 2023-09-12 ENCOUNTER — Emergency Department (HOSPITAL_COMMUNITY)
Admission: EM | Admit: 2023-09-12 | Discharge: 2023-09-12 | Discharge status: Against Medical Advice | Payer: 59 | Attending: Emergency Medicine | Admitting: Emergency Medicine

## 2023-09-12 ENCOUNTER — Other Ambulatory Visit: Payer: Self-pay

## 2023-09-12 DIAGNOSIS — Z7982 Long term (current) use of aspirin: Secondary | ICD-10-CM | POA: Insufficient documentation

## 2023-09-12 DIAGNOSIS — Z5321 Procedure and treatment not carried out due to patient leaving prior to being seen by health care provider: Secondary | ICD-10-CM | POA: Insufficient documentation

## 2023-09-12 DIAGNOSIS — Z992 Dependence on renal dialysis: Secondary | ICD-10-CM | POA: Insufficient documentation

## 2023-09-12 DIAGNOSIS — J449 Chronic obstructive pulmonary disease, unspecified: Secondary | ICD-10-CM | POA: Diagnosis not present

## 2023-09-12 DIAGNOSIS — N186 End stage renal disease: Secondary | ICD-10-CM | POA: Insufficient documentation

## 2023-09-12 DIAGNOSIS — Z794 Long term (current) use of insulin: Secondary | ICD-10-CM | POA: Insufficient documentation

## 2023-09-12 DIAGNOSIS — E119 Type 2 diabetes mellitus without complications: Secondary | ICD-10-CM | POA: Insufficient documentation

## 2023-09-12 LAB — CBC
HCT: 23.5 % — ABNORMAL LOW (ref 39.0–52.0)
Hemoglobin: 7.5 g/dL — ABNORMAL LOW (ref 13.0–17.0)
MCH: 27.9 pg (ref 26.0–34.0)
MCHC: 31.9 g/dL (ref 30.0–36.0)
MCV: 87.4 fL (ref 80.0–100.0)
Platelets: 111 10*3/uL — ABNORMAL LOW (ref 150–400)
RBC: 2.69 MIL/uL — ABNORMAL LOW (ref 4.22–5.81)
RDW: 15.3 % (ref 11.5–15.5)
WBC: 6.7 10*3/uL (ref 4.0–10.5)
nRBC: 0 % (ref 0.0–0.2)

## 2023-09-12 LAB — RENAL FUNCTION PANEL
Albumin: 3.5 g/dL (ref 3.5–5.0)
Anion gap: 15 (ref 5–15)
BUN: 88 mg/dL — ABNORMAL HIGH (ref 8–23)
CO2: 25 mmol/L (ref 22–32)
Calcium: 8 mg/dL — ABNORMAL LOW (ref 8.9–10.3)
Chloride: 91 mmol/L — ABNORMAL LOW (ref 98–111)
Creatinine, Ser: 5.8 mg/dL — ABNORMAL HIGH (ref 0.61–1.24)
GFR, Estimated: 10 mL/min — ABNORMAL LOW (ref >60)
Glucose, Bld: 484 mg/dL — ABNORMAL HIGH (ref 70–99)
Phosphorus: 4.7 mg/dL — ABNORMAL HIGH (ref 2.5–4.6)
Potassium: 4.3 mmol/L (ref 3.5–5.1)
Sodium: 131 mmol/L — ABNORMAL LOW (ref 135–145)

## 2023-09-12 LAB — HEPATITIS B SURFACE ANTIBODY, QUANTITATIVE: Hep B S AB Quant (Post): 35.9 m[IU]/mL

## 2023-09-12 MED ORDER — LIDOCAINE HCL (PF) 1 % IJ SOLN: 5.0000 mL | INTRAMUSCULAR | Status: DC | PRN

## 2023-09-12 MED ORDER — CHLORHEXIDINE GLUCONATE CLOTH 2 % EX PADS: 6.0000 | MEDICATED_PAD | Freq: Every day | CUTANEOUS | Status: DC

## 2023-09-12 MED ORDER — LIDOCAINE-PRILOCAINE 2.5-2.5 % EX CREA: 1.0000 | TOPICAL_CREAM | CUTANEOUS | Status: DC | PRN

## 2023-09-12 MED ORDER — ALTEPLASE 2 MG IJ SOLR: 2.0000 mg | Freq: Once | INTRAMUSCULAR | Status: DC | PRN

## 2023-09-12 MED ORDER — PENTAFLUOROPROP-TETRAFLUOROETH EX AERO: 1.0000 | INHALATION_SPRAY | CUTANEOUS | Status: DC | PRN

## 2023-09-12 MED ORDER — HEPARIN SODIUM (PORCINE) 1000 UNIT/ML DIALYSIS
1000.0000 [IU] | INTRAMUSCULAR | Status: DC | PRN
  Filled 2023-09-12: qty 1

## 2023-09-12 MED ORDER — HEPARIN SODIUM (PORCINE) 1000 UNIT/ML DIALYSIS
2000.0000 [IU] | Freq: Once | INTRAMUSCULAR | Status: AC
  Administered 2023-09-12: 2000 [IU] via INTRAVENOUS_CENTRAL
  Filled 2023-09-12: qty 2

## 2023-09-12 MED ORDER — ANTICOAGULANT SODIUM CITRATE 4% (200MG/5ML) IV SOLN
5.0000 mL | Status: DC | PRN
  Filled 2023-09-12: qty 5

## 2023-09-12 NOTE — ED Notes (Signed)
Pt requested that I sign MSE for him. I signed as witness.

## 2023-09-12 NOTE — Progress Notes (Signed)
Received patient in bed to unit.  Alert and oriented.  Informed consent signed and in chart.   TX duration:3.25  Patient tolerated well.  Transported back to the room  Alert, without acute distress.  Hand-off given to patient's nurse.   Access used: AVG Access issues: n/a  Total UF removed: 3L Post HD weight: unable to weight, patient in ER stretcher & and amputee   09/12/23 1335  Vitals  Temp 97.9 F (36.6 C)  Temp Source Oral  BP (!) 160/57  MAP (mmHg) 83  Pulse Rate 92  ECG Heart Rate 93  Resp 15  Oxygen Therapy  SpO2 100 %  O2 Device Nasal Cannula  O2 Flow Rate (L/min) 3 L/min  During Treatment Monitoring  Blood Flow Rate (mL/min) 399 mL/min  Arterial Pressure (mmHg) -177.77 mmHg  Venous Pressure (mmHg) 280.19 mmHg  TMP (mmHg) 27.07 mmHg  Ultrafiltration Rate (mL/min) 1085 mL/min  Dialysate Flow Rate (mL/min) 300 ml/min  Dialysate Potassium Concentration 3  Dialysate Calcium Concentration 2.5  Duration of HD Treatment -hour(s) 3.25 hour(s)  Cumulative Fluid Removed (mL) per Treatment  3000.18  HD Safety Checks Performed Yes  Intra-Hemodialysis Comments Tx completed  Post Treatment  Dialyzer Clearance Clear  Hemodialysis Intake (mL) 120 mL  Liters Processed 78  Fluid Removed (mL) 3000 mL  Tolerated HD Treatment Yes  AVG/AVF Arterial Site Held (minutes) 5 minutes  AVG/AVF Venous Site Held (minutes) 5 minutes  Fistula / Graft Left Upper arm Arteriovenous vein graft  Placement Date/Time: (c) 04/22/23 (c) 1000   Orientation: Left  Access Location: Upper arm  Access Type: Arteriovenous vein graft  Site Condition No complications  Fistula / Graft Assessment Present;Thrill;Bruit  Status Deaccessed  Needle Size 15  Drainage Description None  Hemodialysis Catheter Left Internal jugular Double lumen Permanent (Tunneled)  Placement Date/Time: 07/18/23 1656   Serial / Lot #: [id number]  Expiration Date: 12/11/27  Time Out: Correct patient;Correct site;Correct  procedure  Maximum sterile barrier precautions: Hand hygiene;Large sterile sheet;Cap;Sterile probe cover;Mask;St...  Site Condition No complications  Blue Lumen Status Heparin locked  Red Lumen Status Heparin locked  Catheter fill solution Heparin 1000 units/ml  Catheter fill volume (Arterial) 2.1 cc  Catheter fill volume (Venous) 2.1  Dressing Type Transparent  Dressing Status Antimicrobial disc in place  Interventions New dressing  Drainage Description None  Dressing Change Due 09/19/23  Post treatment catheter status Capped and Clamped         [facility]

## 2023-09-12 NOTE — Progress Notes (Signed)
D/w patient while at HD today, using AVF again for past few times, needs TDC out. Transportation is tricky for him. Will consult [facility] - see if possible for one of their PA can pull the cath either Thurs or next Tues when here. Will await call back from their office. If not feasible, will see if can schedule something as outpatient for him. Exit site cleaned today.   Ozzie Hoyle, PA-C [employer] Pager [phone]

## 2023-09-12 NOTE — Telephone Encounter (Signed)
Received a call from Candace Cruise, PA-C requesting to have patient's [facility] removed while he is at [facility] for dialysis treatments on next Tuesday or Thursday. Noted TDC placed by Dr. Edilia Bo on 07/18/23.   Spoke with Nytelia at [facility] center and scheduled patient for 10/22 at 11 AM.   Spoke with patient and informed of procedure date, with arrival time of 1045 AM to admitting, with no restrictions necessary. Patient verbalized understanding.

## 2023-09-12 NOTE — ED Notes (Signed)
Informed Consent signed by pt for dialysis & witnessed by this RN, paper form is at bedside.

## 2023-09-12 NOTE — ED Notes (Signed)
Pt transported to dialysis

## 2023-09-12 NOTE — ED Provider Notes (Signed)
[place] EMERGENCY DEPARTMENT AT [facility] Provider Note   CSN: [id number] Arrival date & time: 09/12/23  0725     History  No chief complaint on file.   Jeffrey [name] is a 62 y.o. male with history of COPD, type 2 diabetes, end-stage renal disease presenting for hemodialysis.  Last dialysis session was 09/09/2023.  Denies any complaints today.  HPI     Home Medications Prior to Admission medications   Medication Sig Start Date End Date Taking? Authorizing Provider  acetaminophen (TYLENOL) 325 MG tablet Take 2 tablets (650 mg total) by mouth every 6 (six) hours. 07/23/23   Lockie Mola, MD  amLODipine (NORVASC) 10 MG tablet Take 10 mg by mouth every morning. 04/17/20   [provider]  ASPIRIN LOW DOSE 81 MG EC tablet Take 81 mg by mouth in the morning. 02/22/21   [provider]  atorvastatin (LIPITOR) 40 MG tablet Take 40 mg by mouth in the morning. 02/09/21   [provider]  calcium acetate (PHOSLO) 667 MG capsule Take 2 capsules (1,334 mg total) by mouth with breakfast, with lunch, and with evening meal. 04/10/21   Calvert Cantor, MD  Calcium Carbonate Antacid (CALCIUM CARBONATE, DOSED IN MG ELEMENTAL CALCIUM,) 1250 MG/5ML SUSP Take 5 mLs (500 mg of elemental calcium total) by mouth every 6 (six) hours as needed for indigestion. 04/08/23   Rhetta Mura, MD  carvedilol (COREG) 12.5 MG tablet Take 12.5 mg by mouth 2 (two) times daily with a meal. 04/17/20   [provider]  Cholecalciferol (VITAMIN D) 50 MCG (2000 UT) CAPS Take 2,000 Units by mouth daily.    [provider]  clopidogrel (PLAVIX) 75 MG tablet Take 1 tablet (75 mg total) by mouth daily. 11/02/22   Rhyne, Ames Coupe, PA-C  cyclobenzaprine (FLEXERIL) 10 MG tablet Take 10 mg by mouth 3 (three) times daily as needed for muscle spasms. 12/29/21   [provider]  diazepam (VALIUM) 5 MG tablet Take 5 mg by mouth 2 (two) times daily as needed for muscle  spasms. 12/09/21   [provider]  insulin regular human CONCENTRATED (HUMULIN R U-500 KWIKPEN) 500 UNIT/ML KwikPen Inject 45 Units into the skin daily with supper. Patient taking differently: Inject 70-125 Units into the skin 2 (two) times daily with a meal. 09/04/21 08/30/24  [name], MD  nortriptyline (PAMELOR) 10 MG capsule Take 20 mg by mouth at bedtime.    [provider]  oxyCODONE (OXY IR/ROXICODONE) 5 MG immediate release tablet Take 1 tablet (5 mg total) by mouth every 4 (four) hours as needed for severe pain or moderate pain (please give patient scheduled tylenol BEFORE oxycodone). 07/23/23   Lockie Mola, MD  pantoprazole (PROTONIX) 40 MG tablet Take 40 mg by mouth daily before breakfast. 02/09/21   [provider]  pregabalin (LYRICA) 75 MG capsule Take 1 capsule (75 mg total) by mouth See admin instructions. Daily.  Give after dialysis on dialysis days. 01/21/23   [name], MD  sertraline (ZOLOFT) 100 MG tablet Take 100 mg by mouth in the morning. 02/22/21   [provider]  tamsulosin (FLOMAX) 0.4 MG CAPS capsule Take 1 capsule (0.4 mg total) by mouth daily. 11/10/21   [name], MD  torsemide (DEMADEX) 20 MG tablet Take 120 mg by mouth 2 (two) times daily.    [provider]  traZODone (DESYREL) 150 MG tablet Take 150 mg by mouth at bedtime. 12/09/21   [provider]  Allergies    Actos [pioglitazone], Dexmedetomidine, Ibuprofen, Tomato, and Wellbutrin [bupropion]    Review of Systems   Review of Systems  Physical Exam Updated Vital Signs BP (!) 175/142   Pulse 95   Temp 97.9 F (36.6 C) (Oral)   Resp 16   Ht 5\' 5"  (1.651 m)   Wt 117.4 kg   SpO2 100%   BMI 43.07 kg/m  Physical Exam Vitals and nursing note reviewed.  Constitutional:      Appearance: Normal appearance.  HENT:     Head: Atraumatic.  Pulmonary:     Effort: Pulmonary effort is normal.  Neurological:     General: No focal deficit  present.     Mental Status: He is alert.  Psychiatric:        Mood and Affect: Mood normal.        Behavior: Behavior normal.     ED Results / Procedures / Treatments   Labs (all labs ordered are listed, but only abnormal results are displayed) Labs Reviewed  CBC - Abnormal; Notable for the following components:      Result Value   RBC 2.69 (*)    Hemoglobin 7.5 (*)    HCT 23.5 (*)    Platelets 111 (*)    All other components within normal limits  RENAL FUNCTION PANEL - Abnormal; Notable for the following components:   Sodium 131 (*)    Chloride 91 (*)    Glucose, Bld 484 (*)    BUN 88 (*)    Creatinine, Ser 5.80 (*)    Calcium 8.0 (*)    Phosphorus 4.7 (*)    GFR, Estimated 10 (*)    All other components within normal limits  HEPATITIS B SURFACE ANTIGEN  HEPATITIS B SURFACE ANTIBODY, QUANTITATIVE    EKG None  Radiology No results found.  Procedures Procedures    Medications Ordered in ED Medications  Chlorhexidine Gluconate Cloth 2 % PADS 6 each (0 each Topical Hold 09/12/23 0818)  heparin injection 2,000 Units (2,000 Units Dialysis Given 09/12/23 1034)    ED Course/ Medical Decision Making/ A&P                                 Medical Decision Making  62 y.o. male with pertinent past medical history of ESRD presents to the ED for concern of routine dialysis, no other complaints  Differential diagnosis includes but is not limited to end-stage renal disease requiring dialysis  ED Course:  Patient overall well-appearing, no acute complaints.  Presents for his dialysis today.  He is on a Tuesday, Thursday, Saturday schedule.  I have talked to Dr. Glenna Fellows and PA Tommi Emery with nephrology who will take patient for dialysis.   Impression: ERRD on dialysis  Disposition:  Patient eloped from facility after dialysis    Consultations Obtained: I requested consultation with the nephrology Dr. Glenna Fellows,  and discussed lab and imaging findings as well as  pertinent plan - they recommend: dialysis  External records from outside source obtained and reviewed including ER note from 09/09/2023 where patient presented for dialysis   Co morbidities that complicate the patient evaluation  ESRD              Final Clinical Impression(s) / ED Diagnoses Final diagnoses:  ESRD on dialysis [facility])    Rx / DC Orders ED Discharge Orders     None  Arabella Merles, PA-C 09/12/23 1526    Ernie Avena, MD 09/12/23 1625

## 2023-09-12 NOTE — ED Triage Notes (Signed)
Pt here for regular T,TH,Sat dialysis. NO other complaints.  V[facility]

## 2023-09-15 ENCOUNTER — Other Ambulatory Visit: Payer: Self-pay

## 2023-09-15 ENCOUNTER — Emergency Department (HOSPITAL_COMMUNITY): Payer: 59

## 2023-09-15 ENCOUNTER — Inpatient Hospital Stay (HOSPITAL_COMMUNITY)
Admission: EM | Admit: 2023-09-15 | Discharge: 2023-09-27 | DRG: 291 | Disposition: A | Discharge status: Home / Self Care | Payer: 59 | Attending: Family Medicine | Admitting: Family Medicine

## 2023-09-15 DIAGNOSIS — J9621 Acute and chronic respiratory failure with hypoxia: Secondary | ICD-10-CM | POA: Diagnosis not present

## 2023-09-15 DIAGNOSIS — Z992 Dependence on renal dialysis: Secondary | ICD-10-CM

## 2023-09-15 DIAGNOSIS — R079 Chest pain, unspecified: Principal | ICD-10-CM

## 2023-09-15 DIAGNOSIS — E1151 Type 2 diabetes mellitus with diabetic peripheral angiopathy without gangrene: Secondary | ICD-10-CM | POA: Diagnosis present

## 2023-09-15 DIAGNOSIS — E1100 Type 2 diabetes mellitus with hyperosmolarity without nonketotic hyperglycemic-hyperosmolar coma (NKHHC): Secondary | ICD-10-CM | POA: Diagnosis present

## 2023-09-15 DIAGNOSIS — R0789 Other chest pain: Secondary | ICD-10-CM | POA: Diagnosis present

## 2023-09-15 DIAGNOSIS — N2581 Secondary hyperparathyroidism of renal origin: Secondary | ICD-10-CM | POA: Diagnosis present

## 2023-09-15 DIAGNOSIS — K219 Gastro-esophageal reflux disease without esophagitis: Secondary | ICD-10-CM | POA: Diagnosis present

## 2023-09-15 DIAGNOSIS — Z794 Long term (current) use of insulin: Secondary | ICD-10-CM

## 2023-09-15 DIAGNOSIS — M542 Cervicalgia: Secondary | ICD-10-CM | POA: Diagnosis present

## 2023-09-15 DIAGNOSIS — Z7902 Long term (current) use of antithrombotics/antiplatelets: Secondary | ICD-10-CM

## 2023-09-15 DIAGNOSIS — M62838 Other muscle spasm: Secondary | ICD-10-CM | POA: Diagnosis present

## 2023-09-15 DIAGNOSIS — I132 Hypertensive heart and chronic kidney disease with heart failure and with stage 5 chronic kidney disease, or end stage renal disease: Principal | ICD-10-CM | POA: Diagnosis present

## 2023-09-15 DIAGNOSIS — Z888 Allergy status to other drugs, medicaments and biological substances status: Secondary | ICD-10-CM

## 2023-09-15 DIAGNOSIS — D631 Anemia in chronic kidney disease: Secondary | ICD-10-CM | POA: Diagnosis present

## 2023-09-15 DIAGNOSIS — E871 Hypo-osmolality and hyponatremia: Secondary | ICD-10-CM | POA: Diagnosis present

## 2023-09-15 DIAGNOSIS — E1122 Type 2 diabetes mellitus with diabetic chronic kidney disease: Secondary | ICD-10-CM | POA: Diagnosis present

## 2023-09-15 DIAGNOSIS — R200 Anesthesia of skin: Secondary | ICD-10-CM | POA: Diagnosis present

## 2023-09-15 DIAGNOSIS — R42 Dizziness and giddiness: Secondary | ICD-10-CM | POA: Diagnosis not present

## 2023-09-15 DIAGNOSIS — D509 Iron deficiency anemia, unspecified: Secondary | ICD-10-CM | POA: Diagnosis present

## 2023-09-15 DIAGNOSIS — Z87891 Personal history of nicotine dependence: Secondary | ICD-10-CM

## 2023-09-15 DIAGNOSIS — Z91158 Patient's noncompliance with renal dialysis for other reason: Secondary | ICD-10-CM

## 2023-09-15 DIAGNOSIS — Z6841 Body Mass Index (BMI) 40.0 and over, adult: Secondary | ICD-10-CM

## 2023-09-15 DIAGNOSIS — J449 Chronic obstructive pulmonary disease, unspecified: Secondary | ICD-10-CM | POA: Diagnosis present

## 2023-09-15 DIAGNOSIS — Z7982 Long term (current) use of aspirin: Secondary | ICD-10-CM

## 2023-09-15 DIAGNOSIS — N186 End stage renal disease: Secondary | ICD-10-CM | POA: Diagnosis not present

## 2023-09-15 DIAGNOSIS — E1165 Type 2 diabetes mellitus with hyperglycemia: Secondary | ICD-10-CM | POA: Diagnosis present

## 2023-09-15 DIAGNOSIS — Z9981 Dependence on supplemental oxygen: Secondary | ICD-10-CM

## 2023-09-15 DIAGNOSIS — I252 Old myocardial infarction: Secondary | ICD-10-CM

## 2023-09-15 DIAGNOSIS — Z5986 Financial insecurity: Secondary | ICD-10-CM

## 2023-09-15 DIAGNOSIS — I700 Atherosclerosis of aorta: Secondary | ICD-10-CM | POA: Diagnosis present

## 2023-09-15 DIAGNOSIS — Z91018 Allergy to other foods: Secondary | ICD-10-CM

## 2023-09-15 DIAGNOSIS — D696 Thrombocytopenia, unspecified: Secondary | ICD-10-CM | POA: Diagnosis present

## 2023-09-15 DIAGNOSIS — E538 Deficiency of other specified B group vitamins: Secondary | ICD-10-CM | POA: Diagnosis present

## 2023-09-15 DIAGNOSIS — Z89612 Acquired absence of left leg above knee: Secondary | ICD-10-CM

## 2023-09-15 DIAGNOSIS — G4733 Obstructive sleep apnea (adult) (pediatric): Secondary | ICD-10-CM | POA: Diagnosis present

## 2023-09-15 DIAGNOSIS — R0602 Shortness of breath: Secondary | ICD-10-CM

## 2023-09-15 DIAGNOSIS — Z79899 Other long term (current) drug therapy: Secondary | ICD-10-CM

## 2023-09-15 DIAGNOSIS — E11649 Type 2 diabetes mellitus with hypoglycemia without coma: Secondary | ICD-10-CM | POA: Diagnosis not present

## 2023-09-15 DIAGNOSIS — I5033 Acute on chronic diastolic (congestive) heart failure: Secondary | ICD-10-CM | POA: Diagnosis present

## 2023-09-15 LAB — COMPREHENSIVE METABOLIC PANEL
ALT: 10 U/L (ref 0–44)
AST: 12 U/L — ABNORMAL LOW (ref 15–41)
Albumin: 3.6 g/dL (ref 3.5–5.0)
Alkaline Phosphatase: 87 U/L (ref 38–126)
Anion gap: 18 — ABNORMAL HIGH (ref 5–15)
BUN: 91 mg/dL — ABNORMAL HIGH (ref 8–23)
CO2: 26 mmol/L (ref 22–32)
Calcium: 8.1 mg/dL — ABNORMAL LOW (ref 8.9–10.3)
Chloride: 88 mmol/L — ABNORMAL LOW (ref 98–111)
Creatinine, Ser: 5.64 mg/dL — ABNORMAL HIGH (ref 0.61–1.24)
GFR, Estimated: 11 mL/min — ABNORMAL LOW (ref >60)
Glucose, Bld: 318 mg/dL — ABNORMAL HIGH (ref 70–99)
Potassium: 4 mmol/L (ref 3.5–5.1)
Sodium: 132 mmol/L — ABNORMAL LOW (ref 135–145)
Total Bilirubin: 0.3 mg/dL (ref 0.3–1.2)
Total Protein: 7.1 g/dL (ref 6.5–8.1)

## 2023-09-15 LAB — CBC WITH DIFFERENTIAL/PLATELET
Abs Immature Granulocytes: 0.14 10*3/uL — ABNORMAL HIGH (ref 0.00–0.07)
Basophils Absolute: 0 10*3/uL (ref 0.0–0.1)
Basophils Relative: 0 %
Eosinophils Absolute: 0.2 10*3/uL (ref 0.0–0.5)
Eosinophils Relative: 2 %
HCT: 24.4 % — ABNORMAL LOW (ref 39.0–52.0)
Hemoglobin: 8.1 g/dL — ABNORMAL LOW (ref 13.0–17.0)
Immature Granulocytes: 2 %
Lymphocytes Relative: 10 %
Lymphs Abs: 0.8 10*3/uL (ref 0.7–4.0)
MCH: 28.2 pg (ref 26.0–34.0)
MCHC: 33.2 g/dL (ref 30.0–36.0)
MCV: 85 fL (ref 80.0–100.0)
Monocytes Absolute: 0.4 10*3/uL (ref 0.1–1.0)
Monocytes Relative: 5 %
Neutro Abs: 6.3 10*3/uL (ref 1.7–7.7)
Neutrophils Relative %: 81 %
Platelets: 127 10*3/uL — ABNORMAL LOW (ref 150–400)
RBC: 2.87 MIL/uL — ABNORMAL LOW (ref 4.22–5.81)
RDW: 15.6 % — ABNORMAL HIGH (ref 11.5–15.5)
WBC: 7.9 10*3/uL (ref 4.0–10.5)
nRBC: 0 % (ref 0.0–0.2)

## 2023-09-15 LAB — TROPONIN I (HIGH SENSITIVITY): Troponin I (High Sensitivity): 14 ng/L (ref <18)

## 2023-09-15 LAB — CBG MONITORING, ED: Glucose-Capillary: 276 mg/dL — ABNORMAL HIGH (ref 70–99)

## 2023-09-15 MED ORDER — CHLORHEXIDINE GLUCONATE CLOTH 2 % EX PADS
6.0000 | MEDICATED_PAD | Freq: Every day | CUTANEOUS | Status: DC
  Administered 2023-09-17: 6 via TOPICAL

## 2023-09-15 MED ORDER — HYDROMORPHONE HCL 1 MG/ML IJ SOLN
0.5000 mg | Freq: Once | INTRAMUSCULAR | Status: AC
  Administered 2023-09-15: 0.5 mg via INTRAVENOUS
  Filled 2023-09-15: qty 1

## 2023-09-15 MED ORDER — IOHEXOL 350 MG/ML SOLN
100.0000 mL | Freq: Once | INTRAVENOUS | Status: AC | PRN
  Administered 2023-09-15: 100 mL via INTRAVENOUS

## 2023-09-15 NOTE — ED Provider Notes (Signed)
[place] [facility] AT [facility] Provider Note   CSN: [id number] Arrival date & time: 09/15/23  2124     History {Add pertinent medical, surgical, social history, OB history to HPI:1} Chief Complaint  Patient presents with   Chest Pain    Patient to ED via EMS with complaint of chest pain x 1 hour. Pain is in center chest radiates to both arms. Patient missed dialysis yesterday, normally dialyzes Tues. Thur. And Sat. Ems reports CBG 541. EMS gave 324 ASA and 0.4 SL nitroglycerin. Patient has 20 RAC. Patient is fully alert and oriented.    Jeffrey [name] is a 62 y.o. male with PMH as listed below who presents with acute onset severe 10/10 chest pain radiating to BL upper extremities, L>R. Feels also numbness in the arms, pain was so severe in BL arms/numbness that he couldn't drive his wheelchair. A/w mild SOB. Missed dialysis yesterday, last dialysis was Tuesday, got a full session. Missed yesterday because had to wait for an oxygen delivery at home. Hasn't felt this way when he's missed dialysis in the past. Denies nausea/vomiting. Using LUE for dialysis most recently. Supposed to get L upper chest catheter out next week. No h/o similar pain. No f/c, cough, abdominal pain.    Past Medical History:  Diagnosis Date   Altered mental status 05/04/2021   Arthritis    Blood transfusion without reported diagnosis    CHF (congestive heart failure) (HCC)    COPD (chronic obstructive pulmonary disease) (HCC)    Depression    Diabetes mellitus without complication (HCC)    type 2   Dyspnea    ESRD on hemodialysis (HCC)    Tues Thurs Sat   GERD (gastroesophageal reflux disease)    protonix   Heart murmur    as a child, no problems   Hypertension    Myocardial infarction (HCC)    PTSD (post-traumatic stress disorder)    Sepsis (HCC)    Sleep apnea    occasional uses CPAP   Uses powered wheelchair        Home Medications Prior to Admission medications    Medication Sig Start Date End Date Taking? Authorizing Provider  acetaminophen (TYLENOL) 325 MG tablet Take 2 tablets (650 mg total) by mouth every 6 (six) hours. 07/23/23   Lockie Mola, MD  amLODipine (NORVASC) 10 MG tablet Take 10 mg by mouth every morning. 04/17/20   [provider]  ASPIRIN LOW DOSE 81 MG EC tablet Take 81 mg by mouth in the morning. 02/22/21   [provider]  atorvastatin (LIPITOR) 40 MG tablet Take 40 mg by mouth in the morning. 02/09/21   [provider]  calcium acetate (PHOSLO) 667 MG capsule Take 2 capsules (1,334 mg total) by mouth with breakfast, with lunch, and with evening meal. 04/10/21   Calvert Cantor, MD  Calcium Carbonate Antacid (CALCIUM CARBONATE, DOSED IN MG ELEMENTAL CALCIUM,) 1250 MG/5ML SUSP Take 5 mLs (500 mg of elemental calcium total) by mouth every 6 (six) hours as needed for indigestion. 04/08/23   Rhetta Mura, MD  carvedilol (COREG) 12.5 MG tablet Take 12.5 mg by mouth 2 (two) times daily with a meal. 04/17/20   [provider]  Cholecalciferol (VITAMIN D) 50 MCG (2000 UT) CAPS Take 2,000 Units by mouth daily.    [provider]  clopidogrel (PLAVIX) 75 MG tablet Take 1 tablet (75 mg total) by mouth daily. 11/02/22   Rhyne, Ames Coupe, PA-C  cyclobenzaprine (FLEXERIL)  10 MG tablet Take 10 mg by mouth 3 (three) times daily as needed for muscle spasms. 12/29/21   [provider]  diazepam (VALIUM) 5 MG tablet Take 5 mg by mouth 2 (two) times daily as needed for muscle spasms. 12/09/21   [provider]  insulin regular human CONCENTRATED (HUMULIN R U-500 KWIKPEN) 500 UNIT/ML KwikPen Inject 45 Units into the skin daily with supper. Patient taking differently: Inject 70-125 Units into the skin 2 (two) times daily with a meal. 09/04/21 08/30/24  [name], MD  nortriptyline (PAMELOR) 10 MG capsule Take 20 mg by mouth at bedtime.    [provider]  oxyCODONE (OXY IR/ROXICODONE)  5 MG immediate release tablet Take 1 tablet (5 mg total) by mouth every 4 (four) hours as needed for severe pain or moderate pain (please give patient scheduled tylenol BEFORE oxycodone). 07/23/23   Lockie Mola, MD  pantoprazole (PROTONIX) 40 MG tablet Take 40 mg by mouth daily before breakfast. 02/09/21   [provider]  pregabalin (LYRICA) 75 MG capsule Take 1 capsule (75 mg total) by mouth See admin instructions. Daily.  Give after dialysis on dialysis days. 01/21/23   [name], MD  sertraline (ZOLOFT) 100 MG tablet Take 100 mg by mouth in the morning. 02/22/21   [provider]  tamsulosin (FLOMAX) 0.4 MG CAPS capsule Take 1 capsule (0.4 mg total) by mouth daily. 11/10/21   [name], MD  torsemide (DEMADEX) 20 MG tablet Take 120 mg by mouth 2 (two) times daily.    [provider]  traZODone (DESYREL) 150 MG tablet Take 150 mg by mouth at bedtime. 12/09/21   [provider]      Allergies    Actos [pioglitazone], Dexmedetomidine, Ibuprofen, Tomato, and Wellbutrin [bupropion]    Review of Systems   Review of Systems A 10 point review of systems was performed and is negative unless otherwise reported in HPI.  Physical Exam Updated Vital Signs BP (!) 140/71   Pulse 92   Temp 98.1 F (36.7 C) (Oral)   Resp 19   Ht 5\' 5"  (1.651 m)   Wt 117 kg   SpO2 100%   BMI 42.92 kg/m  Physical Exam General: Normal appearing male, lying in bed.  HEENT: PERRLA, Sclera anicteric, MMM, trachea midline.  Cardiology: RRR, no murmurs/rubs/gallops. BL radial and DP pulses equal bilaterally.  Resp: Normal respiratory rate and effort. CTAB, no wheezes, rhonchi, crackles.  Abd: Soft, non-tender, non-distended. No rebound tenderness or guarding.  GU: Deferred. MSK: No peripheral edema or signs of trauma. Extremities without deformity or TTP. No cyanosis or clubbing. Skin: warm, dry. No rashes or lesions. Back: No CVA tenderness Neuro: A&Ox4, CNs II-XII grossly  intact. MAEs. Sensation grossly intact.  Psych: Normal mood and affect.   ED Results / Procedures / Treatments   Labs (all labs ordered are listed, but only abnormal results are displayed) Labs Reviewed  CBC WITH DIFFERENTIAL/PLATELET - Abnormal; Notable for the following components:      Result Value   RBC 2.87 (*)    Hemoglobin 8.1 (*)    HCT 24.4 (*)    RDW 15.6 (*)    Platelets 127 (*)    Abs Immature Granulocytes 0.14 (*)    All other components within normal limits  CBG MONITORING, ED - Abnormal; Notable for the following components:   Glucose-Capillary 276 (*)    All other components within normal limits  COMPREHENSIVE METABOLIC PANEL  TROPONIN I (HIGH SENSITIVITY)  EKG EKG Interpretation Date/Time:  Friday September 15 2023 21:32:53 EDT Ventricular Rate:  93 PR Interval:  152 QRS Duration:  93 QT Interval:  367 QTC Calculation: 457 R Axis:   66  Text Interpretation: Sinus rhythm Abnormal inferior Q waves Minimal ST elevation, inferior leads Similar to prior EKG Confirmed by Vivi Barrack [phone]) on 09/15/2023 9:34:17 PM  Radiology DG Chest Portable 1 View  Result Date: 09/15/2023 CLINICAL DATA:  Chest pain EXAM: PORTABLE CHEST 1 VIEW COMPARISON:  07/18/2023 FINDINGS: Shallow inspiration. Heart size and pulmonary vascularity are likely normal for technique. No airspace disease or consolidation in the lungs. No pleural effusions. No pneumothorax. Mediastinal contours appear intact. Calcification of the aorta. There is a left central venous catheter with tip projecting over the cavoatrial junction region. A vascular stent projects over the right subclavian region. Postoperative changes in the right shoulder. IMPRESSION: No active disease. Electronically Signed   By: Burman Nieves M.D.   On: 09/15/2023 22:03    Procedures Procedures  {Document cardiac monitor, telemetry assessment procedure when appropriate:1}  Medications Ordered in ED Medications  HYDROmorphone  (DILAUDID) injection 0.5 mg (has no administration in time range)    ED Course/ Medical Decision Making/ A&P                          Medical Decision Making Amount and/or Complexity of Data Reviewed Labs: ordered. Decision-making details documented in ED Course. Radiology: ordered. Decision-making details documented in ED Course.  Risk Prescription drug management.    This patient presents to the ED for concern of ***, this involves an extensive number of treatment options, and is a complaint that carries with it a high risk of complications and morbidity.  I considered the following differential and admission for this acute, potentially life threatening condition.   MDM:    DDX for chest pain includes but is not limited to:  ACS/arrhythmia,  PE, aortic dissection, PNA, PTX, esophogeal rupture, biliary disease, cardiac tamponade, pericarditis, GERD/PUD/gastritis, or musculoskeletal pain. Very low suspicion for ACS vs aortic dissection given presenting sx. Patient cannot PERC out based on tachycardia, but will obtain D dimer and reassess, minimal risk factors for PE. No c/f dissection. No abdominal pain and no c/f biliary disease. Consider GERD, given known history.  ***  Clinical Course as of 09/15/23 2226  Fri Sep 15, 2023  2217 Glucose-Capillary(!): 276 [HN]  2217 DG Chest Portable 1 View No acute changes [HN]    Clinical Course User Index [HN] Loetta Rough, MD    Labs: I Ordered, and personally interpreted labs.  The pertinent results include:  ***  Imaging Studies ordered: I ordered imaging studies including *** I independently visualized and interpreted imaging. I agree with the radiologist interpretation  Additional history obtained from ***.  External records from outside source obtained and reviewed including ***  Cardiac Monitoring: The patient was maintained on a cardiac monitor.  I personally viewed and interpreted the cardiac monitored which showed an  underlying rhythm of: ***  Reevaluation: After the interventions noted above, I reevaluated the patient and found that they have :{resolved/improved/worsened:23923::"improved"}  Social Determinants of Health: ***  Disposition:  ***  Co morbidities that complicate the patient evaluation  Past Medical History:  Diagnosis Date   Altered mental status 05/04/2021   Arthritis    Blood transfusion without reported diagnosis    CHF (congestive heart failure) (HCC)    COPD (chronic obstructive pulmonary disease) (HCC)  Depression    Diabetes mellitus without complication (HCC)    type 2   Dyspnea    ESRD on hemodialysis (HCC)    Tues Thurs Sat   GERD (gastroesophageal reflux disease)    protonix   Heart murmur    as a child, no problems   Hypertension    Myocardial infarction (HCC)    PTSD (post-traumatic stress disorder)    Sepsis (HCC)    Sleep apnea    occasional uses CPAP   Uses powered wheelchair      Medicines Meds ordered this encounter  Medications   HYDROmorphone (DILAUDID) injection 0.5 mg    I have reviewed the patients home medicines and have made adjustments as needed  Problem List / ED Course: Problem List Items Addressed This Visit   None        {Document critical care time when appropriate:1} {Document review of labs and clinical decision tools ie heart score, Chads2Vasc2 etc:1}  {Document your independent review of radiology images, and any outside records:1} {Document your discussion with family members, caretakers, and with consultants:1} {Document social determinants of health affecting pt's care:1} {Document your decision making why or why not admission, treatments were needed:1}  This note was created using dictation software, which may contain spelling or grammatical errors.

## 2023-09-16 ENCOUNTER — Encounter (HOSPITAL_COMMUNITY): Payer: Self-pay | Admitting: Internal Medicine

## 2023-09-16 DIAGNOSIS — Z992 Dependence on renal dialysis: Secondary | ICD-10-CM

## 2023-09-16 DIAGNOSIS — R079 Chest pain, unspecified: Secondary | ICD-10-CM | POA: Diagnosis present

## 2023-09-16 DIAGNOSIS — N186 End stage renal disease: Secondary | ICD-10-CM

## 2023-09-16 DIAGNOSIS — E118 Type 2 diabetes mellitus with unspecified complications: Secondary | ICD-10-CM

## 2023-09-16 DIAGNOSIS — R739 Hyperglycemia, unspecified: Secondary | ICD-10-CM

## 2023-09-16 LAB — GLUCOSE, CAPILLARY
Glucose-Capillary: 236 mg/dL — ABNORMAL HIGH (ref 70–99)
Glucose-Capillary: 521 mg/dL (ref 70–99)
Glucose-Capillary: 523 mg/dL (ref 70–99)
Glucose-Capillary: 535 mg/dL (ref 70–99)

## 2023-09-16 LAB — HEPATITIS B SURFACE ANTIGEN: Hepatitis B Surface Ag: NONREACTIVE

## 2023-09-16 LAB — MRSA NEXT GEN BY PCR, NASAL: MRSA by PCR Next Gen: DETECTED — AB

## 2023-09-16 LAB — TROPONIN I (HIGH SENSITIVITY)
Troponin I (High Sensitivity): 17 ng/L (ref <18)
Troponin I (High Sensitivity): 19 ng/L — ABNORMAL HIGH (ref <18)

## 2023-09-16 MED ORDER — DEXTROSE 50 % IV SOLN: 0.0000 mL | INTRAVENOUS | Status: DC | PRN

## 2023-09-16 MED ORDER — DIAZEPAM 2 MG PO TABS
5.0000 mg | ORAL_TABLET | Freq: Two times a day (BID) | ORAL | Status: DC | PRN
  Administered 2023-09-24: 5 mg via ORAL
  Filled 2023-09-16: qty 3

## 2023-09-16 MED ORDER — CYCLOBENZAPRINE HCL 10 MG PO TABS
10.0000 mg | ORAL_TABLET | Freq: Three times a day (TID) | ORAL | Status: DC | PRN
  Administered 2023-09-16 – 2023-09-27 (×8): 10 mg via ORAL
  Filled 2023-09-16 (×9): qty 1

## 2023-09-16 MED ORDER — MUPIROCIN 2 % EX OINT
1.0000 | TOPICAL_OINTMENT | Freq: Two times a day (BID) | CUTANEOUS | Status: AC
  Administered 2023-09-16 – 2023-09-21 (×10): 1 via NASAL
  Filled 2023-09-16: qty 22

## 2023-09-16 MED ORDER — ORAL CARE MOUTH RINSE: 15.0000 mL | OROMUCOSAL | Status: DC | PRN

## 2023-09-16 MED ORDER — NITROGLYCERIN 0.4 MG SL SUBL: 0.4000 mg | SUBLINGUAL_TABLET | SUBLINGUAL | Status: DC | PRN

## 2023-09-16 MED ORDER — ASPIRIN 81 MG PO CHEW
324.0000 mg | CHEWABLE_TABLET | Freq: Once | ORAL | Status: AC
  Administered 2023-09-16: 324 mg via ORAL
  Filled 2023-09-16: qty 4

## 2023-09-16 MED ORDER — HYDROMORPHONE HCL 1 MG/ML IJ SOLN
INTRAMUSCULAR | Status: AC
  Administered 2023-09-16: 0.5 mg
  Filled 2023-09-16: qty 0.5

## 2023-09-16 MED ORDER — CLOPIDOGREL BISULFATE 75 MG PO TABS
75.0000 mg | ORAL_TABLET | Freq: Every day | ORAL | Status: DC
  Administered 2023-09-16 – 2023-09-27 (×12): 75 mg via ORAL
  Filled 2023-09-16 (×12): qty 1

## 2023-09-16 MED ORDER — SODIUM CHLORIDE 0.9 % IV SOLN
INTRAVENOUS | Status: DC
  Administered 2023-09-17: 10 mL via INTRAVENOUS

## 2023-09-16 MED ORDER — ASPIRIN 81 MG PO TBEC
81.0000 mg | DELAYED_RELEASE_TABLET | Freq: Every morning | ORAL | Status: DC
  Administered 2023-09-17 – 2023-09-27 (×11): 81 mg via ORAL
  Filled 2023-09-16 (×11): qty 1

## 2023-09-16 MED ORDER — INSULIN REGULAR(HUMAN) IN NACL 100-0.9 UT/100ML-% IV SOLN
INTRAVENOUS | Status: DC
  Administered 2023-09-17: 11.5 [IU]/h via INTRAVENOUS
  Filled 2023-09-16: qty 100

## 2023-09-16 MED ORDER — DEXTROSE IN LACTATED RINGERS 5 % IV SOLN: INTRAVENOUS | Status: DC

## 2023-09-16 MED ORDER — CARVEDILOL 12.5 MG PO TABS
12.5000 mg | ORAL_TABLET | Freq: Two times a day (BID) | ORAL | Status: DC
  Administered 2023-09-16 – 2023-09-27 (×20): 12.5 mg via ORAL
  Filled 2023-09-16 (×21): qty 1

## 2023-09-16 MED ORDER — INSULIN REGULAR HUMAN (CONC) 500 UNIT/ML ~~LOC~~ SOPN
45.0000 [IU] | PEN_INJECTOR | Freq: Every day | SUBCUTANEOUS | Status: DC
  Administered 2023-09-16: 45 [IU] via SUBCUTANEOUS
  Filled 2023-09-16: qty 3

## 2023-09-16 MED ORDER — TORSEMIDE 20 MG PO TABS
120.0000 mg | ORAL_TABLET | Freq: Two times a day (BID) | ORAL | Status: DC
  Administered 2023-09-16 – 2023-09-27 (×20): 120 mg via ORAL
  Filled 2023-09-16 (×20): qty 6

## 2023-09-16 MED ORDER — MELATONIN 3 MG PO TABS
3.0000 mg | ORAL_TABLET | Freq: Every evening | ORAL | Status: DC | PRN
  Administered 2023-09-22: 3 mg via ORAL
  Filled 2023-09-16 (×2): qty 1

## 2023-09-16 MED ORDER — AMLODIPINE BESYLATE 10 MG PO TABS
10.0000 mg | ORAL_TABLET | Freq: Every morning | ORAL | Status: DC
  Administered 2023-09-17 – 2023-09-27 (×10): 10 mg via ORAL
  Filled 2023-09-16 (×10): qty 1

## 2023-09-16 MED ORDER — PANTOPRAZOLE SODIUM 40 MG PO TBEC
40.0000 mg | DELAYED_RELEASE_TABLET | Freq: Every day | ORAL | Status: DC
  Administered 2023-09-17 – 2023-09-27 (×11): 40 mg via ORAL
  Filled 2023-09-16 (×11): qty 1

## 2023-09-16 MED ORDER — NALOXONE HCL 0.4 MG/ML IJ SOLN: 0.4000 mg | INTRAMUSCULAR | Status: DC | PRN

## 2023-09-16 MED ORDER — OXYCODONE HCL 5 MG PO TABS
5.0000 mg | ORAL_TABLET | ORAL | Status: DC | PRN
  Administered 2023-09-16 – 2023-09-26 (×30): 5 mg via ORAL
  Filled 2023-09-16 (×33): qty 1

## 2023-09-16 MED ORDER — FENTANYL CITRATE PF 50 MCG/ML IJ SOSY
25.0000 ug | PREFILLED_SYRINGE | INTRAMUSCULAR | Status: DC | PRN
  Administered 2023-09-16 (×2): 25 ug via INTRAVENOUS
  Filled 2023-09-16 (×2): qty 1

## 2023-09-16 MED ORDER — CALCIUM ACETATE (PHOS BINDER) 667 MG PO CAPS
1334.0000 mg | ORAL_CAPSULE | Freq: Three times a day (TID) | ORAL | Status: DC
  Administered 2023-09-16 – 2023-09-23 (×21): 1334 mg via ORAL
  Filled 2023-09-16 (×24): qty 2

## 2023-09-16 MED ORDER — TAMSULOSIN HCL 0.4 MG PO CAPS
0.4000 mg | ORAL_CAPSULE | Freq: Every day | ORAL | Status: DC
  Administered 2023-09-16 – 2023-09-27 (×12): 0.4 mg via ORAL
  Filled 2023-09-16 (×12): qty 1

## 2023-09-16 MED ORDER — ATORVASTATIN CALCIUM 40 MG PO TABS
40.0000 mg | ORAL_TABLET | Freq: Every morning | ORAL | Status: DC
  Administered 2023-09-17 – 2023-09-27 (×11): 40 mg via ORAL
  Filled 2023-09-16 (×11): qty 1

## 2023-09-16 MED ORDER — SERTRALINE HCL 100 MG PO TABS
100.0000 mg | ORAL_TABLET | Freq: Every morning | ORAL | Status: DC
  Administered 2023-09-17 – 2023-09-27 (×11): 100 mg via ORAL
  Filled 2023-09-16 (×11): qty 1

## 2023-09-16 MED ORDER — VITAMIN D 25 MCG (1000 UNIT) PO TABS
2000.0000 [IU] | ORAL_TABLET | Freq: Every day | ORAL | Status: DC
  Administered 2023-09-17 – 2023-09-20 (×4): 2000 [IU] via ORAL
  Filled 2023-09-16 (×4): qty 2

## 2023-09-16 MED ORDER — INSULIN ASPART 100 UNIT/ML IJ SOLN
0.0000 [IU] | Freq: Three times a day (TID) | INTRAMUSCULAR | Status: DC
  Administered 2023-09-16: 2 [IU] via SUBCUTANEOUS

## 2023-09-16 MED ORDER — NORTRIPTYLINE HCL 10 MG PO CAPS
20.0000 mg | ORAL_CAPSULE | Freq: Every day | ORAL | Status: DC
  Administered 2023-09-16 – 2023-09-26 (×11): 20 mg via ORAL
  Filled 2023-09-16 (×13): qty 2

## 2023-09-16 MED ORDER — ACETAMINOPHEN 650 MG RE SUPP: 650.0000 mg | Freq: Four times a day (QID) | RECTAL | Status: DC | PRN

## 2023-09-16 MED ORDER — INSULIN ASPART 100 UNIT/ML IJ SOLN
10.0000 [IU] | Freq: Once | INTRAMUSCULAR | Status: AC
  Administered 2023-09-16: 10 [IU] via SUBCUTANEOUS

## 2023-09-16 MED ORDER — TRAZODONE HCL 50 MG PO TABS
150.0000 mg | ORAL_TABLET | Freq: Every day | ORAL | Status: DC
  Administered 2023-09-16 – 2023-09-26 (×11): 150 mg via ORAL
  Filled 2023-09-16 (×11): qty 3

## 2023-09-16 MED ORDER — CALCIUM CARBONATE ANTACID 1250 MG/5ML PO SUSP: 500.0000 mg | Freq: Four times a day (QID) | ORAL | Status: DC | PRN

## 2023-09-16 MED ORDER — ACETAMINOPHEN 325 MG PO TABS
650.0000 mg | ORAL_TABLET | Freq: Four times a day (QID) | ORAL | Status: DC | PRN
  Administered 2023-09-16: 650 mg via ORAL
  Filled 2023-09-16: qty 2

## 2023-09-16 MED ORDER — PREGABALIN 75 MG PO CAPS
75.0000 mg | ORAL_CAPSULE | Freq: Every day | ORAL | Status: DC
  Administered 2023-09-16 – 2023-09-26 (×11): 75 mg via ORAL
  Filled 2023-09-16 (×11): qty 1

## 2023-09-16 MED ORDER — NITROGLYCERIN 0.4 MG SL SUBL
0.4000 mg | SUBLINGUAL_TABLET | Freq: Once | SUBLINGUAL | Status: AC
  Administered 2023-09-16: 0.4 mg via SUBLINGUAL
  Filled 2023-09-16: qty 1

## 2023-09-16 MED ORDER — ACETAMINOPHEN 325 MG PO TABS
650.0000 mg | ORAL_TABLET | Freq: Four times a day (QID) | ORAL | Status: DC
  Administered 2023-09-16 – 2023-09-27 (×39): 650 mg via ORAL
  Filled 2023-09-16 (×41): qty 2

## 2023-09-16 MED ORDER — ONDANSETRON HCL 4 MG/2ML IJ SOLN: 4.0000 mg | Freq: Four times a day (QID) | INTRAMUSCULAR | Status: DC | PRN

## 2023-09-16 NOTE — Progress Notes (Signed)
  Carryover admission to the Day Admitter.  I discussed this case with the EDP, Dr. Manus Gunning.  Per these discussions:   This is a 62 year old male with history of end-stage renal disease on hemodialysis on Tuesday, Thursday, Saturday, obstructive sleep apnea, COPD, chronic hypoxic respiratory failure on continuous supplemental oxygen as an outpatient, who is being admitted for persistent substernal chest pressure.   The patient presented with 1 day of substernal chest pressure radiating into the bilateral upper extremities, associated with shortness of breath.  He notes that he had missed each of his last 2 hemodialysis sessions.  He was subsequently taken to hemodialysis from the emergency department this evening.  However, upon returning from hemodialysis, continues to experience persistence of his substernal chest pressure  He underwent CTA dissection study, which showed no evidence of acute process, including no evidence of aortic dissection.  Troponin x 1 was 14, with repeat troponin pending at this time.  EKG shows no evidence of acute ischemic changes.  He is receiving a dose of sublingual nitroglycerin at this time.  Per my discussions with the EDP, no known history of underlying CAD.  I have placed an order for observation to PCU for further evaluation and management of his presenting chest discomfort.  I have placed some additional preliminary admit orders via the adult multi-morbid admission order set. I have also ordered additional as needed sublingual nitroglycerin as well as as needed IV fentanyl for chest pain refractory to nitroglycerin.  I have also ordered an echocardiogram for today.    Newton Pigg, DO Hospitalist

## 2023-09-16 NOTE — Progress Notes (Signed)
Pt was given 10 units Novolog for CBG 521 one hour ago. CBG is now 523. Plan to check chemistry panel now and start insulin infusion with frequent CBGs.

## 2023-09-16 NOTE — ED Notes (Signed)
Breakfast tray ordered 

## 2023-09-16 NOTE — ED Notes (Signed)
Patient returned from dialysis.

## 2023-09-16 NOTE — ED Notes (Signed)
Pt transported to dialysis by Pennie Rushing, RN.

## 2023-09-16 NOTE — ED Notes (Signed)
Patient crying, nitroglycerin did not work.

## 2023-09-16 NOTE — H&P (Addendum)
History and Physical    Patient: Jeffrey [name] Jeffrey [name] DOB: 03/15/1961 DOA: 09/15/2023 DOS: the patient was seen and examined on 09/16/2023 PCP: Knox Royalty, MD  Patient coming from: Home  Chief Complaint:  Chief Complaint  Patient presents with   Chest Pain    Patient to ED via EMS with complaint of chest pain x 1 hour. Pain is in center chest radiates to both arms. Patient missed dialysis yesterday, normally dialyzes Tues. Thur. And Sat. Ems reports CBG 541. EMS gave 324 ASA and 0.4 SL nitroglycerin. Patient has 20 RAC. Patient is fully alert and oriented.   HPI: Jeffrey [name] is a 62 y.o. male with medical history significant of end-stage renal disease on hemodialysis on Tuesday, Thursdays and Saturdays, obstructive sleep apnea, COPD, chronic hypoxic respiratory failure on 3 L/min of oxygen at home diabetes mellitus type 2 on insulin, peripheral arterial disease status post left above-knee amputation and history of congestive heart failure.  Patient presented to the emergency room last night with complaints of upper extremity cramps radiating through his back.  Patient has periodic spasms but symptoms from last night was the worst he had ever experienced.  Patient apparently had missed his last session of hemodialysis on Thursday.  He also complained of chest tightness and shortness of breath which is chronic.  He denied any associated nausea, vomiting or palpitations.  On presentation, patient was emergently sent for hemodialysis.  He completed for about 4-hour session.  Patient reports minimal improvement with his symptoms postdialysis.  He is yet to receive his diazepam and Flexeril for muscle spasms.  In the ED, CT scan of the chest done ruled out pulm emboli or aortic dissection.  EKG was also done and essentially unrevealing for any evidence of acute ischemia.  He denies any prior history of coronary disease.  Last echo from July 2024 showed preserved left ventricular ejection  fraction of 55 to 60% with no obvious wall motion abnormalities.  Mild aortic sclerosis however were noted.   Review of Systems: As mentioned in the history of present illness. All other systems reviewed and are negative. Past Medical History:  Diagnosis Date   Altered mental status 05/04/2021   Arthritis    Blood transfusion without reported diagnosis    CHF (congestive heart failure) (HCC)    COPD (chronic obstructive pulmonary disease) (HCC)    Depression    Diabetes mellitus without complication (HCC)    type 2   Dyspnea    ESRD on hemodialysis (HCC)    Tues Thurs Sat   GERD (gastroesophageal reflux disease)    protonix   Heart murmur    as a child, no problems   Hypertension    Myocardial infarction (HCC)    PTSD (post-traumatic stress disorder)    Sepsis (HCC)    Sleep apnea    occasional uses CPAP   Uses powered wheelchair    Past Surgical History:  Procedure Laterality Date   A/V FISTULAGRAM N/A 08/30/2021   Procedure: A/V FISTULAGRAM;  Surgeon: Maeola Harman, MD;  Location: [facility] INVASIVE CV LAB;  Service: Cardiovascular;  Laterality: N/A;   A/V FISTULAGRAM Right 07/28/2022   Procedure: A/V Fistulagram;  Surgeon: Maeola Harman, MD;  Location: [facility] INVASIVE CV LAB;  Service: Cardiovascular;  Laterality: Right;   A/V FISTULAGRAM Right 09/05/2022   Procedure: A/V Fistulagram;  Surgeon: Maeola Harman, MD;  Location: [facility] INVASIVE CV LAB;  Service: Cardiovascular;  Laterality: Right;   A/V FISTULAGRAM Right 10/31/2022  Procedure: A/V Fistulagram;  Surgeon: Maeola Harman, MD;  Location: [facility] INVASIVE CV LAB;  Service: Cardiovascular;  Laterality: Right;   A/V SHUNT INTERVENTION Right 10/31/2022   Procedure: A/V SHUNT INTERVENTION;  Surgeon: Maeola Harman, MD;  Location: [facility] INVASIVE CV LAB;  Service: Cardiovascular;  Laterality: Right;   A/V SHUNTOGRAM N/A 11/08/2021   Procedure: A/V SHUNTOGRAM;  Surgeon: Maeola Harman, MD;  Location: [facility] INVASIVE CV LAB;  Service: Cardiovascular;  Laterality: N/A;   AMPUTATION Left 04/03/2021   Procedure: LEFT ABOVE-THE-KNEE AMPUTATION;  Surgeon: Terance Hart, MD;  Location: [facility] OR;  Service: Orthopedics;  Laterality: Left;   AV FISTULA PLACEMENT Right 08/12/2021   Procedure: INSERTION OF ARTERIOVENOUS (AV) GORE-TEX GRAFT RIGHT ARM;  Surgeon: Nada Libman, MD;  Location: [facility] OR;  Service: Vascular;  Laterality: Right;   AV FISTULA PLACEMENT Left 04/07/2023   Procedure: ARTERIOVENOUS (AV) FISTULA GRAFT USING GORETEX STRETCH (4-7MM);  Surgeon: Chuck Hint, MD;  Location: [facility] OR;  Service: Vascular;  Laterality: Left;   INSERTION OF DIALYSIS CATHETER N/A 11/25/2022   Procedure: INSERTION OF DIALYSIS CATHETER;  Surgeon: Lorin Glass, MD;  Location: [facility] ENDOSCOPY;  Service: Pulmonary;  Laterality: N/A;   INSERTION OF DIALYSIS CATHETER N/A 03/03/2023   Procedure: INSERTION OF TUNNELED DIALYSIS CATHETER;  Surgeon: Maeola Harman, MD;  Location: [facility] OR;  Service: Vascular;  Laterality: N/A;   INSERTION OF DIALYSIS CATHETER Left 04/07/2023   Procedure: INSERTION OF DIALYSIS CATHETER USING PALINDROME  28CM PRECISION CHRONIC CATHETER KIT;  Surgeon: Chuck Hint, MD;  Location: [facility] OR;  Service: Vascular;  Laterality: Left;   INSERTION OF DIALYSIS CATHETER Left 07/18/2023   Procedure: INSERTION OF TUNNELED DIALYSIS CATHETER USING 23cm PALINDROME CHRONIC CATHETER;  Surgeon: Chuck Hint, MD;  Location: [facility] OR;  Service: Vascular;  Laterality: Left;   IR REMOVAL TUN CV CATH W/O FL  03/30/2021   JOINT REPLACEMENT     Bilateral knees   LIGATION ARTERIOVENOUS GORTEX GRAFT Right 03/03/2023   Procedure: LIGATION RIGHT ARM ARTERIOVENOUS GORTEX GRAFT;  Surgeon: Maeola Harman, MD;  Location: [facility] OR;  Service: Vascular;  Laterality: Right;   PERIPHERAL VASCULAR BALLOON ANGIOPLASTY Right 08/30/2021   Procedure: PERIPHERAL VASCULAR BALLOON  ANGIOPLASTY;  Surgeon: Maeola Harman, MD;  Location: [facility] INVASIVE CV LAB;  Service: Cardiovascular;  Laterality: Right;   PERIPHERAL VASCULAR BALLOON ANGIOPLASTY Right 07/28/2022   Procedure: PERIPHERAL VASCULAR BALLOON ANGIOPLASTY;  Surgeon: Maeola Harman, MD;  Location: [facility] INVASIVE CV LAB;  Service: Cardiovascular;  Laterality: Right;  AVF   PERIPHERAL VASCULAR INTERVENTION  11/08/2021   Procedure: PERIPHERAL VASCULAR INTERVENTION;  Surgeon: Maeola Harman, MD;  Location: [facility] INVASIVE CV LAB;  Service: Cardiovascular;;  Central rt arm fistula   UPPER EXTREMITY VENOGRAPHY Right 08/10/2021   Procedure: UPPER EXTREMITY VENOGRAPHY;  Surgeon: Nada Libman, MD;  Location: [facility] INVASIVE CV LAB;  Service: Cardiovascular;  Laterality: Right;   Social History:  reports that he quit smoking about 12 years ago. His smoking use included cigarettes. He has never used smokeless tobacco. He reports current alcohol use. He reports that he does not currently use drugs.  Allergies  Allergen Reactions   Actos [Pioglitazone] Anaphylaxis and Rash   Dexmedetomidine Nausea And Vomiting and Other (See Comments)    (Precedex) Dose-limiting bradycardia     Ibuprofen Shortness Of Breath, Swelling and Other (See Comments)    Pt tolerates aspirin    Tomato Anaphylaxis and Other (See Comments)  Only allergic to RAW tomatoes   Wellbutrin [Bupropion] Swelling    No family history on file.  Prior to Admission medications   Medication Sig Start Date End Date Taking? Authorizing Provider  acetaminophen (TYLENOL) 325 MG tablet Take 2 tablets (650 mg total) by mouth every 6 (six) hours. 07/23/23   Lockie Mola, MD  amLODipine (NORVASC) 10 MG tablet Take 10 mg by mouth every morning. 04/17/20   [provider]  ASPIRIN LOW DOSE 81 MG EC tablet Take 81 mg by mouth in the morning. 02/22/21   [provider]  atorvastatin (LIPITOR) 40 MG tablet Take 40 mg by mouth in the  morning. 02/09/21   [provider]  calcium acetate (PHOSLO) 667 MG capsule Take 2 capsules (1,334 mg total) by mouth with breakfast, with lunch, and with evening meal. 04/10/21   Calvert Cantor, MD  Calcium Carbonate Antacid (CALCIUM CARBONATE, DOSED IN MG ELEMENTAL CALCIUM,) 1250 MG/5ML SUSP Take 5 mLs (500 mg of elemental calcium total) by mouth every 6 (six) hours as needed for indigestion. 04/08/23   Rhetta Mura, MD  carvedilol (COREG) 12.5 MG tablet Take 12.5 mg by mouth 2 (two) times daily with a meal. 04/17/20   [provider]  Cholecalciferol (VITAMIN D) 50 MCG (2000 UT) CAPS Take 2,000 Units by mouth daily.    [provider]  clopidogrel (PLAVIX) 75 MG tablet Take 1 tablet (75 mg total) by mouth daily. 11/02/22   Rhyne, Ames Coupe, PA-C  cyclobenzaprine (FLEXERIL) 10 MG tablet Take 10 mg by mouth 3 (three) times daily as needed for muscle spasms. 12/29/21   [provider]  diazepam (VALIUM) 5 MG tablet Take 5 mg by mouth 2 (two) times daily as needed for muscle spasms. 12/09/21   [provider]  insulin regular human CONCENTRATED (HUMULIN R U-500 KWIKPEN) 500 UNIT/ML KwikPen Inject 45 Units into the skin daily with supper. Patient taking differently: Inject 70-125 Units into the skin 2 (two) times daily with a meal. 09/04/21 08/30/24  [name], MD  nortriptyline (PAMELOR) 10 MG capsule Take 20 mg by mouth at bedtime.    [provider]  oxyCODONE (OXY IR/ROXICODONE) 5 MG immediate release tablet Take 1 tablet (5 mg total) by mouth every 4 (four) hours as needed for severe pain or moderate pain (please give patient scheduled tylenol BEFORE oxycodone). 07/23/23   Lockie Mola, MD  pantoprazole (PROTONIX) 40 MG tablet Take 40 mg by mouth daily before breakfast. 02/09/21   [provider]  pregabalin (LYRICA) 75 MG capsule Take 1 capsule (75 mg total) by mouth See admin instructions. Daily.  Give after dialysis on  dialysis days. 01/21/23   [name], MD  sertraline (ZOLOFT) 100 MG tablet Take 100 mg by mouth in the morning. 02/22/21   [provider]  tamsulosin (FLOMAX) 0.4 MG CAPS capsule Take 1 capsule (0.4 mg total) by mouth daily. 11/10/21   [name], MD  torsemide (DEMADEX) 20 MG tablet Take 120 mg by mouth 2 (two) times daily.    [provider]  traZODone (DESYREL) 150 MG tablet Take 150 mg by mouth at bedtime. 12/09/21   [provider]    Physical Exam: Vitals:   09/16/23 0449 09/16/23 0451 09/16/23 0533 09/16/23 0718  BP:  (!) 132/100 139/86 (!) 153/103  Pulse: 89 88 85 88  Resp: 13 20 16 20   Temp:  98.2 F (36.8 C) 98 F (36.7 C) 98.2 F (36.8 C)  TempSrc:  Oral  Oral  SpO2: 99% 94% 98% 100%  Weight:      Height:       General: Morbidly obese gentleman, who is laying comfortably in bed.  Nasal cannula in place.  HEENT: Significant arthropathy.  Nasopharynx could not be visualized.  Nasal cannula in place Neck: Short, no obvious jugular venous distention could be visualized. Chest: Anterior chest wall permanent catheter site noted.  Site looks clean and dry.  Obese body habitus.  Diminished breath sounds due to body habitus. Cardiovascular: Regular S1-S2.  No obvious murmur appreciated this time. Extremity significant for left above-knee amputation.  Right lower extremity with edema. Skin: No obvious ulcerations appreciated.  Back of patient could not be visualized. CNS: Shows no obvious focal deficits.  Data Reviewed: Sodium is 132, potassium 4.0, chloride is 88, bicarb is 26, glucose 318, BUN 91, creatinine 5.64, calcium 8.1 AST 12, ALT 10.  WBC 7.9, hemoglobin 8.1, hematocrit 24, MCV 85, platelet count 127 neutrophil count of 81 glucose 318.  Troponin was 14>>17  CT angio of the chest: Shows no evidence of pulmonary emboli or aortic abnormality.  Lungs demonstrate mild emphysematous changes with no focal infiltrate or effusion.  EKG personally  reviewed by me, shows normal sinus rhythm with no obvious ST-T wave changes suggestive ischemia.   Assessment and Plan: 62 year old patient with end-stage renal disease, missed his last hemodialysis session who presents with upper extremity cramps and back cramps.  He also had complaint of chest tightness.  Patient had required urgent dialysis on admission.  #1.  Acute chest pain: Thought to be likely atypical in etiology most likely musculoskeletal.  Patient had experienced upper extremity cramps and back cramps with associated symptoms.  Cardiac markers thus far has been unremarkable.  EKG unrevealing.  Last echocardiogram showed preserved left ventricular ejection fraction with no wall motion abnormalities.  Patient has been admitted to progressive care unit for monitoring.  Cardiology has been consulted.  Current home regimen of diazepam, Lyrica and Flexeril.  Patient will also continue with aspirin, Plavix and statins.  #2.  End-stage renal dialysis: Frequent ER visits for hemodialysis.  Last session was  4 days prior.  Missed his last dialysis session due to some social issues.  Patient was emergently dialyzed last night on presentation in the ER.  Will consult with nephrology for further treatment and management.  #3.  Type 2 diabetes mellitus on insulin therapy: uncontrolled. Send A1c. Will continue with home regimen of insulin.  Insulin sliding scale per protocol.  Fingerstick glucose monitoring before every meal plus at bedtime.  Renal diet advised.  #4: Peripheral arterial disease complicated by left above-knee amputation Patient is on aspirin and Plavix  #6: Morbid obesity type II with BMI of >42.  Lifestyle modifications advised.  #7.  COPD with chronic respiratory failure.  Currently on 3 L/min of oxygen.  Clinically looks stable.  Not in any acute exacerbation.  As needed bronchodilators advised.  #8.  Obstructive sleep apnea: Patient is advised to have family bring in his CPAP  machine  #9.  Anemia of chronic disease: Likely due to end-stage renal disease.   Advance Care Planning:   Code Status: Full Code   Consults: cardiology  Family Communication:   Severity of Illness: The appropriate patient status for this patient is OBSERVATION. Observation status is judged to be reasonable and necessary in order to provide the required intensity of service to ensure the patient's safety. The patient's presenting symptoms, physical exam findings, and  initial radiographic and laboratory data in the context of their medical condition is felt to place them at decreased risk for further clinical deterioration. Furthermore, it is anticipated that the patient will be medically stable for discharge from the hospital within 2 midnights of admission.   Author: Lilia Pro, MD 09/16/2023 8:52 AM  For on call review [email or web address].

## 2023-09-16 NOTE — ED Notes (Signed)
ED TO INPATIENT HANDOFF REPORT  ED Nurse Name and Phone #: Mena Simonis [phone]  S Name/Age/Gender Jeffrey [name] 62 y.o. male Room/Bed: H021C/H021C  Code Status   Code Status: Full Code  Home/SNF/Other Home Patient oriented to: self, place, time, and situation Is this baseline? 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  May place patient in observation at [facility] or [facility] if equivalent level of care is available:: No  Covid Evaluation: Asymptomatic - no recent exposure (last 10 days) testing not required  Diagnosis: Chest pain [744799]  Admitting Physician: Angie Fava [[id number]]  Attending Physician: Angie Fava [[id number]]          B Medical/Surgery History Past Medical History:  Diagnosis Date   Altered mental status 05/04/2021   Arthritis    Blood transfusion without reported diagnosis    CHF (congestive heart failure) (HCC)    COPD (chronic obstructive pulmonary disease) (HCC)    Depression    Diabetes mellitus without complication (HCC)    type 2   Dyspnea    ESRD on hemodialysis (HCC)     Tues Thurs Sat   GERD (gastroesophageal reflux disease)    protonix   Heart murmur    as a child, no problems   Hypertension    Myocardial infarction (HCC)    PTSD (post-traumatic stress disorder)    Sepsis (HCC)    Sleep apnea    occasional uses CPAP   Uses powered wheelchair    Past Surgical History:  Procedure Laterality Date   A/V FISTULAGRAM N/A 08/30/2021   Procedure: A/V FISTULAGRAM;  Surgeon: Maeola Harman, MD;  Location: [facility] INVASIVE CV LAB;  Service: Cardiovascular;  Laterality: N/A;   A/V FISTULAGRAM Right 07/28/2022   Procedure: A/V Fistulagram;  Surgeon: Maeola Harman, MD;  Location: [facility] INVASIVE CV LAB;  Service: Cardiovascular;  Laterality: Right;   A/V FISTULAGRAM Right 09/05/2022   Procedure: A/V Fistulagram;  Surgeon: Maeola Harman, MD;  Location: [facility] INVASIVE CV LAB;  Service: Cardiovascular;  Laterality: Right;   A/V FISTULAGRAM Right 10/31/2022   Procedure: A/V Fistulagram;  Surgeon: Maeola Harman, MD;  Location: [facility] INVASIVE CV LAB;  Service: Cardiovascular;  Laterality: Right;   A/V SHUNT INTERVENTION Right 10/31/2022   Procedure: A/V SHUNT INTERVENTION;  Surgeon: Maeola Harman, MD;  Location: [facility] INVASIVE CV LAB;  Service: Cardiovascular;  Laterality: Right;   A/V SHUNTOGRAM N/A 11/08/2021   Procedure: A/V SHUNTOGRAM;  Surgeon: Maeola Harman, MD;  Location: [facility] INVASIVE CV LAB;  Service: Cardiovascular;  Laterality: N/A;   AMPUTATION Left 04/03/2021   Procedure: LEFT ABOVE-THE-KNEE AMPUTATION;  Surgeon: Terance Hart, MD;  Location: [facility] OR;  Service: Orthopedics;  Laterality: Left;   AV FISTULA PLACEMENT Right 08/12/2021   Procedure: INSERTION OF ARTERIOVENOUS (AV) GORE-TEX GRAFT RIGHT ARM;  Surgeon: Nada Libman, MD;  Location: [facility] OR;  Service: Vascular;  Laterality: Right;   AV FISTULA PLACEMENT Left 04/07/2023   Procedure: ARTERIOVENOUS (AV) FISTULA GRAFT USING GORETEX STRETCH (4-7MM);   Surgeon: Chuck Hint, MD;  Location: [facility] OR;  Service: Vascular;  Laterality: Left;   INSERTION OF DIALYSIS CATHETER N/A 11/25/2022   Procedure: INSERTION OF DIALYSIS CATHETER;  Surgeon: Lorin Glass, MD;  Location: [facility] ENDOSCOPY;  Service: Pulmonary;  Laterality: N/A;   INSERTION OF DIALYSIS CATHETER N/A 03/03/2023   Procedure: INSERTION OF TUNNELED DIALYSIS CATHETER;  Surgeon: Maeola Harman, MD;  Location: [facility] OR;  Service: Vascular;  Laterality: N/A;   INSERTION OF DIALYSIS CATHETER Left 04/07/2023   Procedure: INSERTION OF DIALYSIS CATHETER USING PALINDROME  28CM PRECISION CHRONIC CATHETER KIT;  Surgeon: Chuck Hint, MD;  Location: [facility] OR;  Service: Vascular;  Laterality: Left;   INSERTION OF DIALYSIS CATHETER Left 07/18/2023   Procedure: INSERTION OF TUNNELED DIALYSIS CATHETER USING 23cm PALINDROME CHRONIC CATHETER;  Surgeon: Chuck Hint, MD;  Location: [facility] OR;  Service: Vascular;  Laterality: Left;   IR REMOVAL TUN CV CATH W/O FL  03/30/2021   JOINT REPLACEMENT     Bilateral knees   LIGATION ARTERIOVENOUS GORTEX GRAFT Right 03/03/2023   Procedure: LIGATION RIGHT ARM ARTERIOVENOUS GORTEX GRAFT;  Surgeon: Maeola Harman, MD;  Location: [facility] OR;  Service: Vascular;  Laterality: Right;   PERIPHERAL VASCULAR BALLOON ANGIOPLASTY Right 08/30/2021   Procedure: PERIPHERAL VASCULAR BALLOON ANGIOPLASTY;  Surgeon: Maeola Harman, MD;  Location: [facility] INVASIVE CV LAB;  Service: Cardiovascular;  Laterality: Right;   PERIPHERAL VASCULAR BALLOON ANGIOPLASTY Right 07/28/2022   Procedure: PERIPHERAL VASCULAR BALLOON ANGIOPLASTY;  Surgeon: Maeola Harman, MD;  Location: [facility] INVASIVE CV LAB;  Service: Cardiovascular;  Laterality: Right;  AVF   PERIPHERAL VASCULAR INTERVENTION  11/08/2021   Procedure: PERIPHERAL VASCULAR INTERVENTION;  Surgeon: Maeola Harman, MD;  Location: [facility] INVASIVE CV LAB;  Service: Cardiovascular;;  Central rt arm  fistula   UPPER EXTREMITY VENOGRAPHY Right 08/10/2021   Procedure: UPPER EXTREMITY VENOGRAPHY;  Surgeon: Nada Libman, MD;  Location: [facility] INVASIVE CV LAB;  Service: Cardiovascular;  Laterality: Right;     A IV Location/Drains/Wounds Patient Lines/Drains/Airways Status     Active Line/Drains/Airways     Name Placement date Placement time Site Days   Peripheral IV 09/15/23 20 G Anterior;Right Forearm 09/15/23  2148  Forearm  1   Fistula / Graft Left Upper arm Arteriovenous vein graft 04/22/23  1000  Upper arm  147   Hemodialysis Catheter Left Internal jugular Double lumen Permanent (Tunneled) 07/18/23  1656  Internal jugular  60            Intake/Output Last 24 hours  Intake/Output Summary (Last 24 hours) at 09/16/2023 1411 Last data filed at 09/16/2023 0451 Gross per 24 hour  Intake --  Output 3000 ml  Net -3000 ml    Labs/Imaging Results for orders placed or performed during the hospital encounter of 09/15/23 (from the past 48 hour(s))  CBG monitoring, ED     Status: Abnormal   Collection Time: 09/15/23  9:45 PM  Result Value Ref Range   Glucose-Capillary 276 (H) 70 - 99 mg/dL    Comment: Glucose reference range applies only to samples  taken after fasting for at least 8 hours.  CBC with Differential     Status: Abnormal   Collection Time: 09/15/23  9:46 PM  Result Value Ref Range   WBC 7.9 4.0 - 10.5 K/uL   RBC 2.87 (L) 4.22 - 5.81 MIL/uL   Hemoglobin 8.1 (L) 13.0 - 17.0 g/dL   HCT 16.1 (L) 09.6 - 04.5 %   MCV 85.0 80.0 - 100.0 fL   MCH 28.2 26.0 - 34.0 pg   MCHC 33.2 30.0 - 36.0 g/dL   RDW 40.9 (H) 81.1 - 91.4 %   Platelets 127 (L) 150 - 400 K/uL    Comment: REPEATED TO VERIFY   nRBC 0.0 0.0 - 0.2 %   Neutrophils Relative % 81 %   Neutro Abs 6.3 1.7 - 7.7 K/uL   Lymphocytes Relative 10 %   Lymphs Abs 0.8 0.7 - 4.0 K/uL   Monocytes Relative 5 %   Monocytes Absolute 0.4 0.1 - 1.0 K/uL   Eosinophils Relative 2 %   Eosinophils Absolute 0.2 0.0 - 0.5 K/uL    Basophils Relative 0 %   Basophils Absolute 0.0 0.0 - 0.1 K/uL   Immature Granulocytes 2 %   Abs Immature Granulocytes 0.14 (H) 0.00 - 0.07 K/uL    Comment: Performed at [facility], [address], [place], [place] [address]  Comprehensive metabolic panel     Status: Abnormal   Collection Time: 09/15/23  9:46 PM  Result Value Ref Range   Sodium 132 (L) 135 - 145 mmol/L   Potassium 4.0 3.5 - 5.1 mmol/L   Chloride 88 (L) 98 - 111 mmol/L   CO2 26 22 - 32 mmol/L   Glucose, Bld 318 (H) 70 - 99 mg/dL    Comment: Glucose reference range applies only to samples taken after fasting for at least 8 hours.   BUN 91 (H) 8 - 23 mg/dL   Creatinine, Ser 6.21 (H) 0.61 - 1.24 mg/dL   Calcium 8.1 (L) 8.9 - 10.3 mg/dL   Total Protein 7.1 6.5 - 8.1 g/dL   Albumin 3.6 3.5 - 5.0 g/dL   AST 12 (L) 15 - 41 U/L   ALT 10 0 - 44 U/L   Alkaline Phosphatase 87 38 - 126 U/L   Total Bilirubin 0.3 0.3 - 1.2 mg/dL   GFR, Estimated 11 (L) >60 mL/min    Comment: (NOTE) Calculated using the CKD-EPI Creatinine Equation (2021)    Anion gap 18 (H) 5 - 15    Comment: Performed at [facility], [address], [place], [place] [address]  Troponin I (High Sensitivity)     Status: None   Collection Time: 09/15/23  9:46 PM  Result Value Ref Range   Troponin I (High Sensitivity) 14 <18 ng/L    Comment: (NOTE) Elevated high sensitivity troponin I (hsTnI) values and significant  changes across serial measurements may suggest ACS but many other  chronic and acute conditions are known to elevate hsTnI results.  Refer to the "Links" section for chest pain algorithms and additional  guidance. Performed at [facility], [address] [place], [place] [address]   Troponin I (High Sensitivity)     Status: None   Collection Time: 09/16/23  6:04 AM  Result Value Ref Range   Troponin I (High Sensitivity) 17 <18 ng/L    Comment: (NOTE) Elevated high sensitivity troponin I (hsTnI) values and significant   changes across serial measurements may suggest ACS  but many other  chronic and acute conditions are known to elevate hsTnI results.  Refer to the "Links" section for chest pain algorithms and additional  guidance. Performed at [facility], [address], [place], [place] [address]    CT Angio Chest Aorta W and/or Wo Contrast  Result Date: 09/15/2023 CLINICAL DATA:  Chest pain EXAM: CT ANGIOGRAPHY CHEST WITH CONTRAST TECHNIQUE: Multidetector CT imaging of the chest was performed using the standard protocol during bolus administration of intravenous contrast. Multiplanar CT image reconstructions and MIPs were obtained to evaluate the vascular anatomy. RADIATION DOSE REDUCTION: This exam was performed according to the departmental dose-optimization program which includes automated exposure control, adjustment of the mA and/or kV according to patient size and/or use of iterative reconstruction technique. CONTRAST:  OMNIPAQUE IOHEXOL 350 MG/ML SOLN COMPARISON:  Chest x-ray from earlier in the same day. FINDINGS: Cardiovascular: Thoracic aorta demonstrates atherosclerotic calcifications. No aneurysmal dilatation or dissection is noted. No cardiac enlargement is seen. The pulmonary artery shows a normal branching pattern. No definitive pulmonary emboli seen. Left jugular dialysis catheter is noted into satisfactory position. Right innominate vein stent is seen. Mediastinum/Nodes: Thoracic inlet is within normal limits. No hilar or mediastinal adenopathy is noted. The esophagus as visualized is within normal limits. Lungs/Pleura: Lungs demonstrate mild emphysematous changes. No focal infiltrate or effusion is seen. No parenchymal nodule is noted. Upper Abdomen: Visualized upper abdomen shows no acute abnormality. Musculoskeletal: No acute bony abnormality is seen. Chronic degenerative changes in the midthoracic spine are seen. Review of the MIP images confirms the above findings. IMPRESSION: No  evidence of pulmonary emboli or aortic abnormality. No acute abnormality noted. Electronically Signed   By: Alcide Clever M.D.   On: 09/15/2023 23:28   DG Chest Portable 1 View  Result Date: 09/15/2023 CLINICAL DATA:  Chest pain EXAM: PORTABLE CHEST 1 VIEW COMPARISON:  07/18/2023 FINDINGS: Shallow inspiration. Heart size and pulmonary vascularity are likely normal for technique. No airspace disease or consolidation in the lungs. No pleural effusions. No pneumothorax. Mediastinal contours appear intact. Calcification of the aorta. There is a left central venous catheter with tip projecting over the cavoatrial junction region. A vascular stent projects over the right subclavian region. Postoperative changes in the right shoulder. IMPRESSION: No active disease. Electronically Signed   By: Burman Nieves M.D.   On: 09/15/2023 22:03    Pending Labs Unresulted Labs (From admission, onward)     Start     Ordered   09/15/23 2252  Hepatitis B surface antigen  (New Admission Hemo Labs (Hepatitis B))  Once,   URGENT        09/15/23 2254   09/15/23 2252  Hepatitis B surface antibody,quantitative  (New Admission Hemo Labs (Hepatitis B))  Once,   URGENT        09/15/23 2254   Signed and Held  Renal function panel  Once,   R        Signed and Held   Signed and Held  CBC  Once,   R        Signed and Held            Vitals/Pain Today's Vitals   09/16/23 0720 09/16/23 1015 09/16/23 1123 09/16/23 1402  BP:  (!) 152/84    Pulse:  100    Resp:  20    Temp:   98 F (36.7 C)   TempSrc:   Oral   SpO2: 100% 100%    Weight:  Height:      PainSc:    9     Isolation Precautions No active isolations  Medications Medications  Chlorhexidine Gluconate Cloth 2 % PADS 6 each (0 each Topical Hold 09/16/23 0532)  acetaminophen (TYLENOL) tablet 650 mg (has no administration in time range)    Or  acetaminophen (TYLENOL) suppository 650 mg (has no administration in time range)  melatonin tablet 3 mg  (has no administration in time range)  ondansetron (ZOFRAN) injection 4 mg (has no administration in time range)  naloxone [facility]) injection 0.4 mg (has no administration in time range)  fentaNYL (SUBLIMAZE) injection 25 mcg (25 mcg Intravenous Given 09/16/23 1403)  nitroGLYCERIN (NITROSTAT) SL tablet 0.4 mg (has no administration in time range)  HYDROmorphone (DILAUDID) injection 0.5 mg (0.5 mg Intravenous Given 09/15/23 2230)  iohexol (OMNIPAQUE) 350 MG/ML injection 100 mL (100 mLs Intravenous Contrast Given 09/15/23 2312)  HYDROmorphone (DILAUDID) 1 MG/ML injection (0.5 mg  Given 09/16/23 0331)  nitroGLYCERIN (NITROSTAT) SL tablet 0.4 mg (0.4 mg Sublingual Given 09/16/23 0550)  aspirin chewable tablet 324 mg (324 mg Oral Given 09/16/23 4098)    Mobility power wheelchair     Focused Assessments    R Recommendations: See Admitting Provider Note  Report given to:   Additional Notes:

## 2023-09-16 NOTE — ED Provider Notes (Signed)
Patient returns from dialysis.  States he does not feel any better and still "feels like I have a [employer] sitting on my chest".  Feels short of breath on his 3 L of oxygen. Will repeat troponin as well as EKG.  Did have CT scan of his chest earlier that showed no evidence of PE or aortic dissection.  Repeat EKG with no acute ischemia.  Recent troponin.  Patient given aspirin as well as nitroglycerin.  Continues to complain of chest pressure and shortness of breath.  Admission discussed with Dr. Arlean Hopping.    Glynn Octave, MD 09/16/23 [phone]

## 2023-09-17 ENCOUNTER — Observation Stay (HOSPITAL_COMMUNITY): Payer: 59

## 2023-09-17 DIAGNOSIS — E1151 Type 2 diabetes mellitus with diabetic peripheral angiopathy without gangrene: Secondary | ICD-10-CM | POA: Diagnosis present

## 2023-09-17 DIAGNOSIS — Z6841 Body Mass Index (BMI) 40.0 and over, adult: Secondary | ICD-10-CM | POA: Diagnosis not present

## 2023-09-17 DIAGNOSIS — D509 Iron deficiency anemia, unspecified: Secondary | ICD-10-CM | POA: Diagnosis present

## 2023-09-17 DIAGNOSIS — I5033 Acute on chronic diastolic (congestive) heart failure: Secondary | ICD-10-CM | POA: Diagnosis present

## 2023-09-17 DIAGNOSIS — R079 Chest pain, unspecified: Secondary | ICD-10-CM | POA: Diagnosis not present

## 2023-09-17 DIAGNOSIS — N186 End stage renal disease: Secondary | ICD-10-CM | POA: Diagnosis present

## 2023-09-17 DIAGNOSIS — I132 Hypertensive heart and chronic kidney disease with heart failure and with stage 5 chronic kidney disease, or end stage renal disease: Secondary | ICD-10-CM | POA: Diagnosis present

## 2023-09-17 DIAGNOSIS — Z992 Dependence on renal dialysis: Secondary | ICD-10-CM | POA: Diagnosis not present

## 2023-09-17 DIAGNOSIS — I700 Atherosclerosis of aorta: Secondary | ICD-10-CM | POA: Diagnosis present

## 2023-09-17 DIAGNOSIS — J9621 Acute and chronic respiratory failure with hypoxia: Secondary | ICD-10-CM | POA: Diagnosis not present

## 2023-09-17 DIAGNOSIS — E1122 Type 2 diabetes mellitus with diabetic chronic kidney disease: Secondary | ICD-10-CM | POA: Diagnosis present

## 2023-09-17 DIAGNOSIS — E11649 Type 2 diabetes mellitus with hypoglycemia without coma: Secondary | ICD-10-CM | POA: Diagnosis not present

## 2023-09-17 DIAGNOSIS — Z87891 Personal history of nicotine dependence: Secondary | ICD-10-CM | POA: Diagnosis not present

## 2023-09-17 DIAGNOSIS — N2581 Secondary hyperparathyroidism of renal origin: Secondary | ICD-10-CM | POA: Diagnosis present

## 2023-09-17 DIAGNOSIS — Z89612 Acquired absence of left leg above knee: Secondary | ICD-10-CM | POA: Diagnosis not present

## 2023-09-17 DIAGNOSIS — Z794 Long term (current) use of insulin: Secondary | ICD-10-CM | POA: Diagnosis not present

## 2023-09-17 DIAGNOSIS — E1100 Type 2 diabetes mellitus with hyperosmolarity without nonketotic hyperglycemic-hyperosmolar coma (NKHHC): Secondary | ICD-10-CM | POA: Diagnosis present

## 2023-09-17 DIAGNOSIS — Z9981 Dependence on supplemental oxygen: Secondary | ICD-10-CM | POA: Diagnosis not present

## 2023-09-17 DIAGNOSIS — J449 Chronic obstructive pulmonary disease, unspecified: Secondary | ICD-10-CM | POA: Diagnosis present

## 2023-09-17 DIAGNOSIS — D631 Anemia in chronic kidney disease: Secondary | ICD-10-CM | POA: Diagnosis present

## 2023-09-17 DIAGNOSIS — E559 Vitamin D deficiency, unspecified: Secondary | ICD-10-CM | POA: Diagnosis not present

## 2023-09-17 DIAGNOSIS — E1165 Type 2 diabetes mellitus with hyperglycemia: Secondary | ICD-10-CM | POA: Diagnosis present

## 2023-09-17 DIAGNOSIS — E871 Hypo-osmolality and hyponatremia: Secondary | ICD-10-CM | POA: Diagnosis present

## 2023-09-17 DIAGNOSIS — D696 Thrombocytopenia, unspecified: Secondary | ICD-10-CM | POA: Diagnosis present

## 2023-09-17 LAB — GLUCOSE, CAPILLARY
Glucose-Capillary: 139 mg/dL — ABNORMAL HIGH (ref 70–99)
Glucose-Capillary: 151 mg/dL — ABNORMAL HIGH (ref 70–99)
Glucose-Capillary: 151 mg/dL — ABNORMAL HIGH (ref 70–99)
Glucose-Capillary: 159 mg/dL — ABNORMAL HIGH (ref 70–99)
Glucose-Capillary: 162 mg/dL — ABNORMAL HIGH (ref 70–99)
Glucose-Capillary: 220 mg/dL — ABNORMAL HIGH (ref 70–99)
Glucose-Capillary: 289 mg/dL — ABNORMAL HIGH (ref 70–99)
Glucose-Capillary: 314 mg/dL — ABNORMAL HIGH (ref 70–99)
Glucose-Capillary: 322 mg/dL — ABNORMAL HIGH (ref 70–99)
Glucose-Capillary: 332 mg/dL — ABNORMAL HIGH (ref 70–99)
Glucose-Capillary: 344 mg/dL — ABNORMAL HIGH (ref 70–99)
Glucose-Capillary: 365 mg/dL — ABNORMAL HIGH (ref 70–99)
Glucose-Capillary: 369 mg/dL — ABNORMAL HIGH (ref 70–99)
Glucose-Capillary: 372 mg/dL — ABNORMAL HIGH (ref 70–99)
Glucose-Capillary: 460 mg/dL — ABNORMAL HIGH (ref 70–99)
Glucose-Capillary: 490 mg/dL — ABNORMAL HIGH (ref 70–99)

## 2023-09-17 LAB — BASIC METABOLIC PANEL
Anion gap: 15 (ref 5–15)
BUN: 67 mg/dL — ABNORMAL HIGH (ref 8–23)
CO2: 28 mmol/L (ref 22–32)
Calcium: 8.1 mg/dL — ABNORMAL LOW (ref 8.9–10.3)
Chloride: 90 mmol/L — ABNORMAL LOW (ref 98–111)
Creatinine, Ser: 5.66 mg/dL — ABNORMAL HIGH (ref 0.61–1.24)
GFR, Estimated: 11 mL/min — ABNORMAL LOW (ref >60)
Glucose, Bld: 374 mg/dL — ABNORMAL HIGH (ref 70–99)
Potassium: 3.8 mmol/L (ref 3.5–5.1)
Sodium: 133 mmol/L — ABNORMAL LOW (ref 135–145)

## 2023-09-17 LAB — ECHOCARDIOGRAM COMPLETE
Area-P 1/2: 2.95 cm2
Height: 65 in
S' Lateral: 3.2 cm
Weight: 4525.6 [oz_av]

## 2023-09-17 LAB — CBC
HCT: 27 % — ABNORMAL LOW (ref 39.0–52.0)
Hemoglobin: 8.6 g/dL — ABNORMAL LOW (ref 13.0–17.0)
MCH: 27.6 pg (ref 26.0–34.0)
MCHC: 31.9 g/dL (ref 30.0–36.0)
MCV: 86.5 fL (ref 80.0–100.0)
Platelets: 144 10*3/uL — ABNORMAL LOW (ref 150–400)
RBC: 3.12 MIL/uL — ABNORMAL LOW (ref 4.22–5.81)
RDW: 16 % — ABNORMAL HIGH (ref 11.5–15.5)
WBC: 6.4 10*3/uL (ref 4.0–10.5)
nRBC: 0 % (ref 0.0–0.2)

## 2023-09-17 LAB — VITAMIN B12: Vitamin B-12: 390 pg/mL (ref 180–914)

## 2023-09-17 LAB — VITAMIN D 25 HYDROXY (VIT D DEFICIENCY, FRACTURES): Vit D, 25-Hydroxy: 9.53 ng/mL — ABNORMAL LOW (ref 30–100)

## 2023-09-17 LAB — MAGNESIUM: Magnesium: 1.8 mg/dL (ref 1.7–2.4)

## 2023-09-17 LAB — HEMOGLOBIN A1C
Hgb A1c MFr Bld: 11.1 % — ABNORMAL HIGH (ref 4.8–5.6)
Mean Plasma Glucose: 271.87 mg/dL

## 2023-09-17 LAB — PHOSPHORUS: Phosphorus: 5.6 mg/dL — ABNORMAL HIGH (ref 2.5–4.6)

## 2023-09-17 LAB — FOLATE: Folate: 5.1 ng/mL — ABNORMAL LOW (ref >5.9)

## 2023-09-17 MED ORDER — CHLORHEXIDINE GLUCONATE CLOTH 2 % EX PADS: 6.0000 | MEDICATED_PAD | Freq: Two times a day (BID) | CUTANEOUS | Status: DC

## 2023-09-17 MED ORDER — INSULIN GLARGINE-YFGN 100 UNIT/ML ~~LOC~~ SOLN
20.0000 [IU] | SUBCUTANEOUS | Status: DC
  Administered 2023-09-17: 20 [IU] via SUBCUTANEOUS
  Filled 2023-09-17 (×2): qty 0.2

## 2023-09-17 MED ORDER — HEPARIN SODIUM (PORCINE) 5000 UNIT/ML IJ SOLN
5000.0000 [IU] | Freq: Three times a day (TID) | INTRAMUSCULAR | Status: DC
  Administered 2023-09-17 – 2023-09-27 (×27): 5000 [IU] via SUBCUTANEOUS
  Filled 2023-09-17 (×29): qty 1

## 2023-09-17 MED ORDER — INSULIN ASPART 100 UNIT/ML IJ SOLN
0.0000 [IU] | Freq: Three times a day (TID) | INTRAMUSCULAR | Status: DC
  Administered 2023-09-17 – 2023-09-18 (×3): 15 [IU] via SUBCUTANEOUS
  Administered 2023-09-18: 7 [IU] via SUBCUTANEOUS
  Administered 2023-09-18: 20 [IU] via SUBCUTANEOUS
  Administered 2023-09-19: 7 [IU] via SUBCUTANEOUS
  Administered 2023-09-19: 20 [IU] via SUBCUTANEOUS
  Administered 2023-09-19: 15 [IU] via SUBCUTANEOUS
  Administered 2023-09-20 (×2): 3 [IU] via SUBCUTANEOUS
  Administered 2023-09-21: 4 [IU] via SUBCUTANEOUS
  Administered 2023-09-21: 15 [IU] via SUBCUTANEOUS
  Administered 2023-09-22: 7 [IU] via SUBCUTANEOUS
  Administered 2023-09-22: 4 [IU] via SUBCUTANEOUS
  Administered 2023-09-22: 20 [IU] via SUBCUTANEOUS
  Administered 2023-09-23: 3 [IU] via SUBCUTANEOUS

## 2023-09-17 MED ORDER — PERFLUTREN LIPID MICROSPHERE
1.0000 mL | INTRAVENOUS | Status: AC | PRN
  Administered 2023-09-17: 4 mL via INTRAVENOUS

## 2023-09-17 MED ORDER — INSULIN ASPART 100 UNIT/ML IJ SOLN
6.0000 [IU] | Freq: Three times a day (TID) | INTRAMUSCULAR | Status: DC
  Administered 2023-09-17 – 2023-09-18 (×3): 6 [IU] via SUBCUTANEOUS

## 2023-09-17 MED ORDER — CHLORHEXIDINE GLUCONATE CLOTH 2 % EX PADS
6.0000 | MEDICATED_PAD | Freq: Every day | CUTANEOUS | Status: DC
  Administered 2023-09-17 – 2023-09-26 (×10): 6 via TOPICAL

## 2023-09-17 MED ORDER — VITAMIN C 500 MG PO TABS
500.0000 mg | ORAL_TABLET | Freq: Every day | ORAL | Status: DC
  Administered 2023-09-17 – 2023-09-27 (×11): 500 mg via ORAL
  Filled 2023-09-17 (×11): qty 1

## 2023-09-17 MED ORDER — DARBEPOETIN ALFA 200 MCG/0.4ML IJ SOSY
200.0000 ug | PREFILLED_SYRINGE | Freq: Once | INTRAMUSCULAR | Status: AC
  Administered 2023-09-18: 200 ug via SUBCUTANEOUS
  Filled 2023-09-17 (×2): qty 0.4

## 2023-09-17 MED ORDER — POLYSACCHARIDE IRON COMPLEX 150 MG PO CAPS
150.0000 mg | ORAL_CAPSULE | Freq: Every day | ORAL | Status: DC
  Administered 2023-09-17 – 2023-09-27 (×11): 150 mg via ORAL
  Filled 2023-09-17 (×11): qty 1

## 2023-09-17 NOTE — Plan of Care (Signed)
  Problem: Education: Goal: Knowledge of General Education information will improve Description: Including pain rating scale, medication(s)/side effects and non-pharmacologic comfort measures Outcome: Progressing   Problem: Health Behavior/Discharge Planning: Goal: Ability to manage health-related needs will improve Outcome: Progressing   Problem: Pain Managment: Goal: General experience of comfort will improve Outcome: Progressing   Problem: Fluid Volume: Goal: Ability to maintain a balanced intake and output will improve Outcome: Progressing   Problem: Metabolic: Goal: Ability to maintain appropriate glucose levels will improve Outcome: Progressing

## 2023-09-17 NOTE — Progress Notes (Signed)
[facility] Progress Note  Patient: Jeffrey [name]    IEP:[id number]  DOA: 09/15/2023     Date of Service: the patient was seen and examined on 09/17/2023  Chief Complaint  Patient presents with   Chest Pain    Patient to ED via EMS with complaint of chest pain x 1 hour. Pain is in center chest radiates to both arms. Patient missed dialysis yesterday, normally dialyzes Tues. Thur. And Sat. Ems reports CBG 541. EMS gave 324 ASA and 0.4 SL nitroglycerin. Patient has 20 RAC. Patient is fully alert and oriented.   Brief hospital course: Jeffrey [name] is a 62 y.o. male with medical history significant of end-stage renal disease on hemodialysis on Tuesday, Thursdays and Saturdays, obstructive sleep apnea, COPD, chronic hypoxic respiratory failure on 3 L/min of oxygen at home diabetes mellitus type 2 on insulin, peripheral arterial disease status post left above-knee amputation and history of congestive heart failure.  Patient presented to the emergency room last night with complaints of upper extremity cramps radiating through his back.  Patient has periodic spasms but symptoms from last night was the worst he had ever experienced.  Patient apparently had missed his last session of hemodialysis on Thursday.  He also complained of chest tightness and shortness of breath which is chronic.  He denied any associated nausea, vomiting or palpitations.   On presentation, patient was emergently sent for hemodialysis.  He completed for about 4-hour session.  Patient reports minimal improvement with his symptoms postdialysis.  He is yet to receive his diazepam and Flexeril for muscle spasms.   In the ED, CT scan of the chest done ruled out pulm emboli or aortic dissection.  EKG was also done and essentially unrevealing for any evidence of acute ischemia.  He denies any prior history of coronary disease.  Last echo from July 2024 showed preserved left ventricular ejection fraction of 55 to 60% with no obvious  wall motion abnormalities.  Mild aortic sclerosis however were noted.     Assessment and Plan: #  Acute chest pain: Thought to be likely atypical in etiology most likely musculoskeletal.  Patient had experienced upper extremity cramps and back cramps with associated symptoms.  Cardiac markers thus far has been unremarkable.  EKG unrevealing.  Last echocardiogram showed preserved left ventricular ejection fraction with no wall motion abnormalities.  Patient has been admitted to progressive care unit for monitoring.  Cardiology has been consulted.  Current home regimen of diazepam, Lyrica and Flexeril.  Patient will also continue with aspirin, Plavix and statins.   #  End-stage renal dialysis: Frequent ER visits for hemodialysis.  Last session was  4 days prior.  Missed his last dialysis session due to some social issues.  Patient was emergently dialyzed last night on presentation in the ER.  Will consult with nephrology for further treatment and management.   #  Type 2 diabetes mellitus on insulin therapy: uncontrolled.  A1c 11.1 10/19 last night blood sugar was elevated, patient was started on insulin IV infusion 10/20 transition to subcu basal and sliding scale CBG, continue diabetic and renal diet    # Peripheral arterial disease complicated by left above-knee amputation Patient is on aspirin and Plavix   # Morbid obesity type II with BMI of >42.  Lifestyle modifications advised.   # COPD with chronic respiratory failure.  Currently on 3 L/min of oxygen.  Clinically looks stable.  Not in any acute exacerbation.  As needed bronchodilators advised.   #  Obstructive sleep apnea: Patient is advised to have family bring in his CPAP machine   # Anemia of chronic disease: Likely due to end-stage renal disease. Continue darbepoetin as per nephro Iron deficiency, transferrin saturation 15%.  Started oral iron supplement with vitamin C. Follow with PCP to repeat iron profile after 3 to 6  months  Morbid obesity Body mass index is 47.07 kg/m.  Interventions: Calorie restricted diet and daily exercise advised to lose body weight.  Lifestyle modification discussed.  Diet: Carb modified and renal diet DVT Prophylaxis: Subcutaneous Heparin    Advance goals of care discussion: Full code  Family Communication: family was not present at bedside, at the time of interview.  The pt provided permission to discuss medical plan with the family. Opportunity was given to ask question and all questions were answered satisfactorily.   Disposition:  Pt is from Home, admitted with SOB, and chest pain, found to have uncontrolled diabetes, started insulin infusion, still has uncontrolled diabetes, which precludes a safe discharge. Discharge to home, when stable, may need few days more to stay in the hospital..  Subjective: Patient was seen and examined at bedside.  No significant overnight events, patient's breathing is getting better, denied any chest pain.  Physical Exam: General: NAD, lying comfortably Appear in no distress, affect appropriate Eyes: PERRLA ENT: Oral Mucosa Clear, moist  Neck: no JVD,  Cardiovascular: S1 and S2 Present, no Murmur,  Respiratory: good respiratory effort, Bilateral Air entry equal and Decreased, no Crackles, no wheezes Abdomen: Bowel Sound present, Soft but obese and no tenderness,  Skin: no rashes Extremities: s/p Left AKA, no Pedal edema, no calf tenderness Neurologic: without any new focal findings Gait not checked due to patient safety concerns  Vitals:   09/17/23 0359 09/17/23 0621 09/17/23 0721 09/17/23 1140  BP: 126/75 (!) 135/56 (!) 140/56 (!) 148/68  Pulse: 87 89 87 83  Resp: 11  15 20   Temp: 98.5 F (36.9 C)  98.5 F (36.9 C) 97.7 F (36.5 C)  TempSrc: Oral  Oral Oral  SpO2: 98%  98% 94%  Weight: 128.3 kg     Height:        Intake/Output Summary (Last 24 hours) at 09/17/2023 1307 Last data filed at 09/17/2023 1159 Gross per 24  hour  Intake 1532.93 ml  Output 350 ml  Net 1182.93 ml   Filed Weights   09/15/23 2128 09/16/23 1531 09/17/23 0359  Weight: 117 kg 128.8 kg 128.3 kg    Data Reviewed: I have personally reviewed and interpreted daily labs, tele strips, imagings as discussed above. I reviewed all nursing notes, pharmacy notes, vitals, pertinent old records I have discussed plan of care as described above with RN and patient/family.  CBC: Recent Labs  Lab 09/12/23 0912 09/15/23 2146 09/17/23 0227  WBC 6.7 7.9 6.4  NEUTROABS  --  6.3  --   HGB 7.5* 8.1* 8.6*  HCT 23.5* 24.4* 27.0*  MCV 87.4 85.0 86.5  PLT 111* 127* 144*   Basic Metabolic Panel: Recent Labs  Lab 09/12/23 0930 09/15/23 2146 09/17/23 0227  NA 131* 132* 133*  K 4.3 4.0 3.8  CL 91* 88* 90*  CO2 25 26 28   GLUCOSE 484* 318* 374*  BUN 88* 91* 67*  CREATININE 5.80* 5.64* 5.66*  CALCIUM 8.0* 8.1* 8.1*  MG  --   --  1.8  PHOS 4.7*  --  5.6*    Studies: ECHOCARDIOGRAM COMPLETE  Result Date: 09/17/2023    ECHOCARDIOGRAM REPORT  Patient Name:   DAMASCUS BLOT Date of Exam: 09/17/2023 Medical Rec #:  [id number]     Height:       65.0 in Accession #:    [id number]    Weight:       282.8 lb Date of Birth:  1961/04/20     BSA:          2.292 m Patient Age:    62 years      BP:           140/44 mmHg Patient Gender: M             HR:           85 bpm. Exam Location:  Inpatient Procedure: 2D Echo, Color Doppler, Cardiac Doppler and Intracardiac            Opacification Agent Indications:    R07.9* Chest pain, unspecified  History:        Patient has prior history of Echocardiogram examinations, most                 recent 06/01/2023. CHF, COPD; Risk Factors:Hypertension, Diabetes,                 Dyslipidemia and Sleep Apnea.  Sonographer:    Irving Burton Senior RDCS Referring Phys: [id number] Angie Fava  Sonographer Comments: Technically difficult due to patient body habitus. IMPRESSIONS  1. Left ventricular ejection fraction, by estimation, is  60 to 65%. The left ventricle has normal function. The left ventricle has no regional wall motion abnormalities. Left ventricular diastolic parameters are consistent with Grade I diastolic dysfunction (impaired relaxation).  2. Right ventricular systolic function was not well visualized. The right ventricular size is not well visualized. Tricuspid regurgitation signal is inadequate for assessing PA pressure.  3. Left atrial size was mild to moderately dilated.  4. The mitral valve is normal in structure. No evidence of mitral valve regurgitation. No evidence of mitral stenosis.  5. The aortic valve has an indeterminant number of cusps. There is moderate calcification of the aortic valve. There is moderate thickening of the aortic valve. Aortic valve regurgitation is not visualized. Aortic valve sclerosis/calcification is present, without any evidence of aortic stenosis.  6. The inferior vena cava is normal in size with greater than 50% respiratory variability, suggesting right atrial pressure of 3 mmHg. Comparison(s): No prior Echocardiogram. FINDINGS  Left Ventricle: Left ventricular ejection fraction, by estimation, is 60 to 65%. The left ventricle has normal function. The left ventricle has no regional wall motion abnormalities. Definity contrast agent was given IV to delineate the left ventricular  endocardial borders. The left ventricular internal cavity size was normal in size. There is no left ventricular hypertrophy. Left ventricular diastolic parameters are consistent with Grade I diastolic dysfunction (impaired relaxation). Normal left ventricular filling pressure. Right Ventricle: The right ventricular size is not well visualized. No increase in right ventricular wall thickness. Right ventricular systolic function was not well visualized. Tricuspid regurgitation signal is inadequate for assessing PA pressure. Left Atrium: Left atrial size was mild to moderately dilated. Right Atrium: Right atrial size was  not well visualized. Pericardium: Trivial pericardial effusion is present. Mitral Valve: The mitral valve is normal in structure. No evidence of mitral valve regurgitation. No evidence of mitral valve stenosis. Tricuspid Valve: The tricuspid valve is normal in structure. Tricuspid valve regurgitation is trivial. No evidence of tricuspid stenosis. Aortic Valve: The aortic valve has an indeterminant number of cusps. There is moderate  calcification of the aortic valve. There is moderate thickening of the aortic valve. Aortic valve regurgitation is not visualized. Aortic valve sclerosis/calcification  is present, without any evidence of aortic stenosis. Pulmonic Valve: The pulmonic valve was not well visualized. Pulmonic valve regurgitation is not visualized. No evidence of pulmonic stenosis. Aorta: The aortic root and ascending aorta are structurally normal, with no evidence of dilitation. Venous: The inferior vena cava is normal in size with greater than 50% respiratory variability, suggesting right atrial pressure of 3 mmHg. IAS/Shunts: The interatrial septum was not well visualized.  LEFT VENTRICLE PLAX 2D LVIDd:         5.00 cm   Diastology LVIDs:         3.20 cm   LV e' medial:    5.88 cm/s LV PW:         0.90 cm   LV E/e' medial:  14.8 LV IVS:        0.80 cm   LV e' lateral:   5.87 cm/s LVOT diam:     2.10 cm   LV E/e' lateral: 14.8 LV SV:         62 LV SV Index:   27 LVOT Area:     3.46 cm  LEFT ATRIUM              Index LA diam:        3.80 cm  1.66 cm/m LA Vol (A2C):   38.9 ml  16.97 ml/m LA Vol (A4C):   103.0 ml 44.94 ml/m LA Biplane Vol: 64.0 ml  27.92 ml/m  AORTIC VALVE LVOT Vmax:   85.20 cm/s LVOT Vmean:  56.700 cm/s LVOT VTI:    0.179 m  AORTA Ao Root diam: 3.00 cm Ao Asc diam:  3.00 cm MITRAL VALVE MV Area (PHT): 2.95 cm    SHUNTS MV Decel Time: 257 msec    Systemic VTI:  0.18 m MV E velocity: 87.00 cm/s  Systemic Diam: 2.10 cm MV A velocity: 99.10 cm/s MV E/A ratio:  0.88 Vishnu Priya Mallipeddi  Electronically signed by Winfield Rast Mallipeddi Signature Date/Time: 09/17/2023/11:59:40 AM    Final     Scheduled Meds:  acetaminophen  650 mg Oral Q6H   amLODipine  10 mg Oral q morning   ascorbic acid  500 mg Oral Daily   aspirin EC  81 mg Oral q AM   atorvastatin  40 mg Oral q AM   calcium acetate  1,334 mg Oral TID with meals   carvedilol  12.5 mg Oral BID WC   Chlorhexidine Gluconate Cloth  6 each Topical Q0600   cholecalciferol  2,000 Units Oral Daily   clopidogrel  75 mg Oral Daily   darbepoetin (ARANESP) injection - NON-DIALYSIS  200 mcg Subcutaneous Once   insulin aspart  0-20 Units Subcutaneous TID WC   insulin aspart  6 Units Subcutaneous TID WC   insulin glargine-yfgn  20 Units Subcutaneous Q24H   iron polysaccharides  150 mg Oral Daily   mupirocin ointment  1 Application Nasal BID   nortriptyline  20 mg Oral QHS   pantoprazole  40 mg Oral QAC breakfast   pregabalin  75 mg Oral QHS   sertraline  100 mg Oral q AM   tamsulosin  0.4 mg Oral Daily   torsemide  120 mg Oral BID   traZODone  150 mg Oral QHS   Continuous Infusions:  sodium chloride 10 mL (09/17/23 0001)   dextrose 5% lactated ringers 40 mL/hr  at 09/17/23 0404   insulin 9.5 Units/hr (09/17/23 1254)   PRN Meds: acetaminophen **OR** acetaminophen, calcium carbonate (dosed in mg elemental calcium), cyclobenzaprine, dextrose, diazepam, fentaNYL (SUBLIMAZE) injection, melatonin, naLOXone (NARCAN)  injection, nitroGLYCERIN, ondansetron (ZOFRAN) IV, mouth rinse, oxyCODONE  Time spent: 55 minutes  Author: Gillis Santa. MD [facility] 09/17/2023 1:07 PM  To reach On-call, see care teams to locate the attending and reach out to them via [email or web address]. If 7PM-7AM, please contact night-coverage If you still have difficulty reaching the attending provider, please page the [facility] (Director on Call) for [facility] on amion for assistance.

## 2023-09-17 NOTE — Care Management Obs Status (Signed)
MEDICARE OBSERVATION STATUS NOTIFICATION   Patient Details  Name: JUMAL HUSSAIN MRN: [id number] Date of Birth: August 10, 1961   Medicare Observation Status Notification Given:  Yes    Ronny Bacon, RN 09/17/2023, 11:40 AM

## 2023-09-17 NOTE — Progress Notes (Signed)
Jeffrey [name] [birth year] [id number]  Patient admitted to observation due to CP. Seen and examined at bedside.  Tired this AM.  States CP is better, but rates his pain currently 9/10.  Admits to "a little SOB".  No other specific complaints.    Labs this AM at baseline. O2 sat 100% on 3L O2 with nml WOB.  1+ RLE edema.  No acute indications for HD today.  Last OP HD orders (dec 2023):  pt typically comes on Tu, Th and Sat for hospital HD 3:15h  129kg  RUE AVG   Heparin 2000    ESRD: Plan for HD tomorrow if remains admitted. Scheduled as outpatient to have TDC removed on Tuesday at 11AM by [facility].   Anemia: Hgb 8.6, Last ESA given 10/12.  Will order ESA for today.  Virgina Norfolk, PA-C [employer]

## 2023-09-17 NOTE — Plan of Care (Signed)
  Problem: Education: Goal: Knowledge of General Education information will improve Description: Including pain rating scale, medication(s)/side effects and non-pharmacologic comfort measures Outcome: Progressing   Problem: Health Behavior/Discharge Planning: Goal: Ability to manage health-related needs will improve Outcome: Progressing   Problem: Clinical Measurements: Goal: Ability to maintain clinical measurements within normal limits will improve Outcome: Progressing Goal: Will remain free from infection Outcome: Progressing Goal: Diagnostic test results will improve Outcome: Progressing Goal: Respiratory complications will improve Outcome: Progressing Goal: Cardiovascular complication will be avoided Outcome: Progressing   Problem: Activity: Goal: Risk for activity intolerance will decrease Outcome: Progressing   Problem: Nutrition: Goal: Adequate nutrition will be maintained Outcome: Progressing   Problem: Coping: Goal: Level of anxiety will decrease Outcome: Progressing   Problem: Elimination: Goal: Will not experience complications related to bowel motility Outcome: Progressing Goal: Will not experience complications related to urinary retention Outcome: Progressing   Problem: Pain Managment: Goal: General experience of comfort will improve Outcome: Progressing   Problem: Safety: Goal: Ability to remain free from injury will improve Outcome: Progressing   Problem: Skin Integrity: Goal: Risk for impaired skin integrity will decrease Outcome: Progressing   Problem: Education: Goal: Ability to describe self-care measures that may prevent or decrease complications (Diabetes Survival Skills Education) will improve Outcome: Progressing Goal: Individualized Educational Video(s) Outcome: Progressing   Problem: Coping: Goal: Ability to adjust to condition or change in health will improve Outcome: Progressing   Problem: Fluid Volume: Goal: Ability to  maintain a balanced intake and output will improve Outcome: Progressing   Problem: Health Behavior/Discharge Planning: Goal: Ability to identify and utilize available resources and services will improve Outcome: Progressing Goal: Ability to manage health-related needs will improve Outcome: Progressing   Problem: Metabolic: Goal: Ability to maintain appropriate glucose levels will improve Outcome: Progressing   Problem: Nutritional: Goal: Maintenance of adequate nutrition will improve Outcome: Progressing Goal: Progress toward achieving an optimal weight will improve Outcome: Progressing   Problem: Skin Integrity: Goal: Risk for impaired skin integrity will decrease Outcome: Progressing   Problem: Tissue Perfusion: Goal: Adequacy of tissue perfusion will improve Outcome: Progressing   Problem: Education: Goal: Ability to describe self-care measures that may prevent or decrease complications (Diabetes Survival Skills Education) will improve Outcome: Progressing Goal: Individualized Educational Video(s) Outcome: Progressing   Problem: Cardiac: Goal: Ability to maintain an adequate cardiac output will improve Outcome: Progressing   Problem: Health Behavior/Discharge Planning: Goal: Ability to identify and utilize available resources and services will improve Outcome: Progressing Goal: Ability to manage health-related needs will improve Outcome: Progressing   Problem: Fluid Volume: Goal: Ability to achieve a balanced intake and output will improve Outcome: Progressing   Problem: Metabolic: Goal: Ability to maintain appropriate glucose levels will improve Outcome: Progressing   Problem: Nutritional: Goal: Maintenance of adequate nutrition will improve Outcome: Progressing Goal: Maintenance of adequate weight for body size and type will improve Outcome: Progressing   Problem: Respiratory: Goal: Will regain and/or maintain adequate ventilation Outcome: Progressing    Problem: Urinary Elimination: Goal: Ability to achieve and maintain adequate renal perfusion and functioning will improve Outcome: Progressing   

## 2023-09-17 NOTE — Progress Notes (Signed)
Echocardiogram 2D Echocardiogram has been performed.  Warren Lacy Tyheim [place]ne RDCS 09/17/2023, 10:22 AM

## 2023-09-18 DIAGNOSIS — E1100 Type 2 diabetes mellitus with hyperosmolarity without nonketotic hyperglycemic-hyperosmolar coma (NKHHC): Secondary | ICD-10-CM | POA: Diagnosis not present

## 2023-09-18 LAB — BASIC METABOLIC PANEL
Anion gap: 13 (ref 5–15)
BUN: 78 mg/dL — ABNORMAL HIGH (ref 8–23)
CO2: 28 mmol/L (ref 22–32)
Calcium: 8.1 mg/dL — ABNORMAL LOW (ref 8.9–10.3)
Chloride: 87 mmol/L — ABNORMAL LOW (ref 98–111)
Creatinine, Ser: 6.86 mg/dL — ABNORMAL HIGH (ref 0.61–1.24)
GFR, Estimated: 8 mL/min — ABNORMAL LOW (ref >60)
Glucose, Bld: 404 mg/dL — ABNORMAL HIGH (ref 70–99)
Potassium: 4.5 mmol/L (ref 3.5–5.1)
Sodium: 128 mmol/L — ABNORMAL LOW (ref 135–145)

## 2023-09-18 LAB — RENAL FUNCTION PANEL
Albumin: 3.4 g/dL — ABNORMAL LOW (ref 3.5–5.0)
Anion gap: 16 — ABNORMAL HIGH (ref 5–15)
BUN: 83 mg/dL — ABNORMAL HIGH (ref 8–23)
CO2: 24 mmol/L (ref 22–32)
Calcium: 7.9 mg/dL — ABNORMAL LOW (ref 8.9–10.3)
Chloride: 86 mmol/L — ABNORMAL LOW (ref 98–111)
Creatinine, Ser: 7.24 mg/dL — ABNORMAL HIGH (ref 0.61–1.24)
GFR, Estimated: 8 mL/min — ABNORMAL LOW (ref >60)
Glucose, Bld: 451 mg/dL — ABNORMAL HIGH (ref 70–99)
Phosphorus: 6.5 mg/dL — ABNORMAL HIGH (ref 2.5–4.6)
Potassium: 4.3 mmol/L (ref 3.5–5.1)
Sodium: 126 mmol/L — ABNORMAL LOW (ref 135–145)

## 2023-09-18 LAB — CBC
HCT: 26.2 % — ABNORMAL LOW (ref 39.0–52.0)
HCT: 27.3 % — ABNORMAL LOW (ref 39.0–52.0)
Hemoglobin: 8.2 g/dL — ABNORMAL LOW (ref 13.0–17.0)
Hemoglobin: 8.7 g/dL — ABNORMAL LOW (ref 13.0–17.0)
MCH: 27.3 pg (ref 26.0–34.0)
MCH: 28.6 pg (ref 26.0–34.0)
MCHC: 31.3 g/dL (ref 30.0–36.0)
MCHC: 31.9 g/dL (ref 30.0–36.0)
MCV: 87.3 fL (ref 80.0–100.0)
MCV: 89.8 fL (ref 80.0–100.0)
Platelets: 119 10*3/uL — ABNORMAL LOW (ref 150–400)
Platelets: 114 10*3/uL — ABNORMAL LOW (ref 150–400)
RBC: 3 MIL/uL — ABNORMAL LOW (ref 4.22–5.81)
RBC: 3.04 MIL/uL — ABNORMAL LOW (ref 4.22–5.81)
RDW: 15.9 % — ABNORMAL HIGH (ref 11.5–15.5)
RDW: 15.9 % — ABNORMAL HIGH (ref 11.5–15.5)
WBC: 6.2 10*3/uL (ref 4.0–10.5)
WBC: 5.9 10*3/uL (ref 4.0–10.5)
nRBC: 0 % (ref 0.0–0.2)
nRBC: 0 % (ref 0.0–0.2)

## 2023-09-18 LAB — GLUCOSE, CAPILLARY
Glucose-Capillary: 415 mg/dL — ABNORMAL HIGH (ref 70–99)
Glucose-Capillary: 387 mg/dL — ABNORMAL HIGH (ref 70–99)
Glucose-Capillary: 336 mg/dL — ABNORMAL HIGH (ref 70–99)
Glucose-Capillary: 218 mg/dL — ABNORMAL HIGH (ref 70–99)
Glucose-Capillary: 303 mg/dL — ABNORMAL HIGH (ref 70–99)

## 2023-09-18 LAB — PHOSPHORUS: Phosphorus: 6.6 mg/dL — ABNORMAL HIGH (ref 2.5–4.6)

## 2023-09-18 LAB — MAGNESIUM: Magnesium: 1.7 mg/dL (ref 1.7–2.4)

## 2023-09-18 MED ORDER — VITAMIN D (ERGOCALCIFEROL) 1.25 MG (50000 UNIT) PO CAPS
50000.0000 [IU] | ORAL_CAPSULE | ORAL | Status: DC
  Administered 2023-09-18 – 2023-09-25 (×2): 50000 [IU] via ORAL
  Filled 2023-09-18 (×2): qty 1

## 2023-09-18 MED ORDER — HEPARIN SODIUM (PORCINE) 1000 UNIT/ML DIALYSIS: 2000.0000 [IU] | INTRAMUSCULAR | Status: DC | PRN

## 2023-09-18 MED ORDER — INSULIN ASPART 100 UNIT/ML IJ SOLN
10.0000 [IU] | Freq: Three times a day (TID) | INTRAMUSCULAR | Status: DC
  Administered 2023-09-18 – 2023-09-19 (×2): 10 [IU] via SUBCUTANEOUS

## 2023-09-18 MED ORDER — LIDOCAINE-PRILOCAINE 2.5-2.5 % EX CREA: 1.0000 | TOPICAL_CREAM | CUTANEOUS | Status: DC | PRN

## 2023-09-18 MED ORDER — PENTAFLUOROPROP-TETRAFLUOROETH EX AERO: 1.0000 | INHALATION_SPRAY | CUTANEOUS | Status: DC | PRN

## 2023-09-18 MED ORDER — INSULIN GLARGINE-YFGN 100 UNIT/ML ~~LOC~~ SOLN
30.0000 [IU] | Freq: Every day | SUBCUTANEOUS | Status: DC
  Filled 2023-09-18: qty 0.3

## 2023-09-18 MED ORDER — VITAMIN B-12 1000 MCG PO TABS
500.0000 ug | ORAL_TABLET | Freq: Every day | ORAL | Status: DC
  Administered 2023-09-18 – 2023-09-27 (×10): 500 ug via ORAL
  Filled 2023-09-18 (×10): qty 1

## 2023-09-18 MED ORDER — LIDOCAINE HCL (PF) 1 % IJ SOLN: 5.0000 mL | INTRAMUSCULAR | Status: DC | PRN

## 2023-09-18 MED ORDER — HEPARIN SODIUM (PORCINE) 1000 UNIT/ML DIALYSIS: 1000.0000 [IU] | INTRAMUSCULAR | Status: DC | PRN

## 2023-09-18 MED ORDER — ALTEPLASE 2 MG IJ SOLR: 2.0000 mg | Freq: Once | INTRAMUSCULAR | Status: DC | PRN

## 2023-09-18 MED ORDER — INSULIN GLARGINE-YFGN 100 UNIT/ML ~~LOC~~ SOLN
30.0000 [IU] | Freq: Two times a day (BID) | SUBCUTANEOUS | Status: DC
  Administered 2023-09-18 – 2023-09-19 (×3): 30 [IU] via SUBCUTANEOUS
  Filled 2023-09-18 (×4): qty 0.3

## 2023-09-18 MED ORDER — INSULIN ASPART 100 UNIT/ML IJ SOLN
10.0000 [IU] | Freq: Once | INTRAMUSCULAR | Status: AC
  Administered 2023-09-18: 10 [IU] via INTRAVENOUS

## 2023-09-18 MED ORDER — ANTICOAGULANT SODIUM CITRATE 4% (200MG/5ML) IV SOLN: 5.0000 mL | Status: DC | PRN

## 2023-09-18 MED ORDER — FOLIC ACID 1 MG PO TABS
1.0000 mg | ORAL_TABLET | Freq: Every day | ORAL | Status: DC
  Administered 2023-09-18 – 2023-09-27 (×10): 1 mg via ORAL
  Filled 2023-09-18 (×10): qty 1

## 2023-09-18 NOTE — Progress Notes (Signed)
[facility] Progress Note  Patient: Jeffrey [name]    ZOX:[id number]  DOA: 09/15/2023     Date of Service: the patient was seen and examined on 09/18/2023  Chief Complaint  Patient presents with   Chest Pain    Patient to ED via EMS with complaint of chest pain x 1 hour. Pain is in center chest radiates to both arms. Patient missed dialysis yesterday, normally dialyzes Tues. Thur. And Sat. Ems reports CBG 541. EMS gave 324 ASA and 0.4 SL nitroglycerin. Patient has 20 RAC. Patient is fully alert and oriented.   Brief hospital course: Jeffrey [name] is a 62 y.o. male with medical history significant of end-stage renal disease on hemodialysis on Tuesday, Thursdays and Saturdays, obstructive sleep apnea, COPD, chronic hypoxic respiratory failure on 3 L/min of oxygen at home diabetes mellitus type 2 on insulin, peripheral arterial disease status post left above-knee amputation and history of congestive heart failure.  Patient presented to the emergency room last night with complaints of upper extremity cramps radiating through his back.  Patient has periodic spasms but symptoms from last night was the worst he had ever experienced.  Patient apparently had missed his last session of hemodialysis on Thursday.  He also complained of chest tightness and shortness of breath which is chronic.  He denied any associated nausea, vomiting or palpitations.   On presentation, patient was emergently sent for hemodialysis.  He completed for about 4-hour session.  Patient reports minimal improvement with his symptoms postdialysis.  He is yet to receive his diazepam and Flexeril for muscle spasms.   In the ED, CT scan of the chest done ruled out pulm emboli or aortic dissection.  EKG was also done and essentially unrevealing for any evidence of acute ischemia.  He denies any prior history of coronary disease.  Last echo from July 2024 showed preserved left ventricular ejection fraction of 55 to 60% with no obvious  wall motion abnormalities.  Mild aortic sclerosis however were noted.     Assessment and Plan: #  Acute chest pain: Thought to be likely atypical in etiology most likely musculoskeletal.  Patient had experienced upper extremity cramps and back cramps with associated symptoms.  Cardiac markers thus far has been unremarkable.  EKG unrevealing.  Last echocardiogram showed preserved left ventricular ejection fraction with no wall motion abnormalities.  Patient has been admitted to progressive care unit for monitoring.  Cardiology was consulted on 1019 but patient was not seen.  Patient seems to be stable, so I did not consult cardiology.  Patient can follow as an outpatient.  Current home regimen of diazepam, Lyrica and Flexeril.  Patient will also continue with aspirin, Plavix and statins.   #  End-stage renal dialysis: Frequent ER visits for hemodialysis.  Last session was  4 days prior.  Missed his last dialysis session due to some social issues.  Patient was emergently dialyzed last night on presentation in the ER.  Will consult with nephrology for further treatment and management.   #  Type 2 diabetes mellitus on insulin therapy: uncontrolled.  A1c 11.1 10/19 last night blood sugar was elevated, patient was started on insulin IV infusion.  At home patient was using insulin U-500 70 units twice daily with meals 10/20 transition to subcu basal and sliding scale, DC'd insulin infusion 10/21 increased Semglee 30 units twice daily and NovoLog 10 units 3 times daily with sliding scale. CBG, continue diabetic and renal diet    # Peripheral arterial disease  complicated by left above-knee amputation Patient is on aspirin and Plavix     # COPD with chronic respiratory failure.  Currently on 3 L/min of oxygen.  Clinically looks stable.  Not in any acute exacerbation.  As needed bronchodilators advised.   # Obstructive sleep apnea: Patient is advised to have family bring in his CPAP machine   # Anemia  of chronic disease: Likely due to end-stage renal disease. Continue darbepoetin as per nephro Iron deficiency, transferrin saturation 15%.  Started oral iron supplement with vitamin C. Follow with PCP to repeat iron profile after 3 to 6 months  Folic acid deficiency, folic acid level 5.1, started folic acid 1 mg p.o. daily.  Follow with PCP to repeat folic acid level after 3 to 6 months.  Vitamin D deficiency: started vitamin D 50,000 units p.o. weekly, follow with PCP to repeat vitamin D level after 3 to 6 months.  Vitamin B12 level 390, goal >400, started vitamin B12 500 mcg p.o. daily to prevent from deficiency.   Morbid obesity Body mass index is 47.91 kg/m.  Interventions: Calorie restricted diet and daily exercise advised to lose body weight.  Lifestyle modification discussed.  Diet: Carb modified and renal diet DVT Prophylaxis: Subcutaneous Heparin    Advance goals of care discussion: Full code  Family Communication: family was not present at bedside, at the time of interview.  The pt provided permission to discuss medical plan with the family. Opportunity was given to ask question and all questions were answered satisfactorily.   Disposition:  Pt is from Home, admitted with SOB, and chest pain, found to have uncontrolled diabetes, started insulin infusion, still has uncontrolled diabetes, which precludes a safe discharge. Discharge to home, when stable, may need few days more to stay in the hospital..  Subjective: Patient was seen and examined at bedside.  No significant overnight events, patient denies any chest pain or palpitations, resting comfortably, no active issues.  Patient was advised that he needs to stay here today for diabetes management.  Patient agreed with the plan.   Physical Exam: General: NAD, lying comfortably Appear in no distress, affect appropriate Eyes: PERRLA ENT: Oral Mucosa Clear, moist  Neck: no JVD,  Cardiovascular: S1 and S2 Present, no  Murmur,  Respiratory: good respiratory effort, Bilateral Air entry equal and Decreased, no Crackles, no wheezes Abdomen: Bowel Sound present, Soft but obese and no tenderness,  Skin: no rashes Extremities: s/p Left AKA, no Pedal edema, no calf tenderness Neurologic: without any new focal findings Gait not checked due to patient safety concerns  Vitals:   09/18/23 1210 09/18/23 1225 09/18/23 1240 09/18/23 1255  BP: (!) 153/76 132/77 137/71 (!) 147/75  Pulse: 85 83 86 86  Resp: 10 10 (!) 9 11  Temp:      TempSrc:      SpO2: 100% 100% 100% 100%  Weight:      Height:        Intake/Output Summary (Last 24 hours) at 09/18/2023 1322 Last data filed at 09/18/2023 1610 Gross per 24 hour  Intake 822.64 ml  Output 1200 ml  Net -377.36 ml   Filed Weights   09/17/23 0359 09/18/23 0329 09/18/23 1138  Weight: 128.3 kg 130.8 kg 130.6 kg    Data Reviewed: I have personally reviewed and interpreted daily labs, tele strips, imagings as discussed above. I reviewed all nursing notes, pharmacy notes, vitals, pertinent old records I have discussed plan of care as described above with RN and patient/family.  CBC: Recent Labs  Lab 09/12/23 0912 09/15/23 2146 09/17/23 0227 09/18/23 0230 09/18/23 0959  WBC 6.7 7.9 6.4 6.2 5.9  NEUTROABS  --  6.3  --   --   --   HGB 7.5* 8.1* 8.6* 8.2* 8.7*  HCT 23.5* 24.4* 27.0* 26.2* 27.3*  MCV 87.4 85.0 86.5 87.3 89.8  PLT 111* 127* 144* 119* 114*   Basic Metabolic Panel: Recent Labs  Lab 09/12/23 0930 09/15/23 2146 09/17/23 0227 09/18/23 0230 09/18/23 0959  NA 131* 132* 133* 128* 126*  K 4.3 4.0 3.8 4.5 4.3  CL 91* 88* 90* 87* 86*  CO2 25 26 28 28 24   GLUCOSE 484* 318* 374* 404* 451*  BUN 88* 91* 67* 78* 83*  CREATININE 5.80* 5.64* 5.66* 6.86* 7.24*  CALCIUM 8.0* 8.1* 8.1* 8.1* 7.9*  MG  --   --  1.8 1.7  --   PHOS 4.7*  --  5.6* 6.6* 6.5*    Studies: No results found.  Scheduled Meds:  acetaminophen  650 mg Oral Q6H   amLODipine   10 mg Oral q morning   ascorbic acid  500 mg Oral Daily   aspirin EC  81 mg Oral q AM   atorvastatin  40 mg Oral q AM   calcium acetate  1,334 mg Oral TID with meals   carvedilol  12.5 mg Oral BID WC   Chlorhexidine Gluconate Cloth  6 each Topical Q0600   cholecalciferol  2,000 Units Oral Daily   clopidogrel  75 mg Oral Daily   vitamin B-12  500 mcg Oral Daily   folic acid  1 mg Oral Daily   heparin injection (subcutaneous)  5,000 Units Subcutaneous Q8H   insulin aspart  0-20 Units Subcutaneous TID WC   insulin aspart  10 Units Subcutaneous TID WC   insulin glargine-yfgn  30 Units Subcutaneous BID   iron polysaccharides  150 mg Oral Daily   mupirocin ointment  1 Application Nasal BID   nortriptyline  20 mg Oral QHS   pantoprazole  40 mg Oral QAC breakfast   pregabalin  75 mg Oral QHS   sertraline  100 mg Oral q AM   tamsulosin  0.4 mg Oral Daily   torsemide  120 mg Oral BID   traZODone  150 mg Oral QHS   Vitamin D (Ergocalciferol)  50,000 Units Oral Q7 days   Continuous Infusions:  anticoagulant sodium citrate     PRN Meds: acetaminophen **OR** acetaminophen, alteplase, anticoagulant sodium citrate, calcium carbonate (dosed in mg elemental calcium), cyclobenzaprine, dextrose, diazepam, fentaNYL (SUBLIMAZE) injection, heparin, heparin, lidocaine (PF), lidocaine-prilocaine, melatonin, naLOXone (NARCAN)  injection, nitroGLYCERIN, ondansetron (ZOFRAN) IV, mouth rinse, oxyCODONE, pentafluoroprop-tetrafluoroeth  Time spent: 55 minutes  Author: Gillis Santa. MD [facility] 09/18/2023 1:22 PM  To reach On-call, see care teams to locate the attending and reach out to them via [email or web address]. If 7PM-7AM, please contact night-coverage If you still have difficulty reaching the attending provider, please page the [facility] (Director on Call) for [facility] on amion for assistance.

## 2023-09-18 NOTE — Plan of Care (Signed)
Problem: Education: Goal: Knowledge of General Education information will improve Description: Including pain rating scale, medication(s)/side effects and non-pharmacologic comfort measures 09/18/2023 0417 by Billey Gosling D, RN Outcome: Progressing 09/18/2023 0417 by Excell Seltzer, Waneda Klammer D, RN Outcome: Progressing   Problem: Health Behavior/Discharge Planning: Goal: Ability to manage health-related needs will improve 09/18/2023 0417 by Billey Gosling D, RN Outcome: Progressing 09/18/2023 0417 by Excell Seltzer, Cynai Skeens D, RN Outcome: Progressing   Problem: Clinical Measurements: Goal: Ability to maintain clinical measurements within normal limits will improve 09/18/2023 0417 by Billey Gosling D, RN Outcome: Progressing 09/18/2023 0417 by Excell Seltzer, Annistyn Depass D, RN Outcome: Progressing Goal: Will remain free from infection 09/18/2023 0417 by Billey Gosling D, RN Outcome: Progressing 09/18/2023 0417 by Excell Seltzer Daila Elbert D, RN Outcome: Progressing Goal: Diagnostic test results will improve 09/18/2023 0417 by Billey Gosling D, RN Outcome: Progressing 09/18/2023 0417 by Excell Seltzer Philemon Riedesel D, RN Outcome: Progressing Goal: Respiratory complications will improve 09/18/2023 0417 by Billey Gosling D, RN Outcome: Progressing 09/18/2023 0417 by Excell Seltzer Joselyne Spake D, RN Outcome: Progressing Goal: Cardiovascular complication will be avoided 09/18/2023 0417 by Billey Gosling D, RN Outcome: Progressing 09/18/2023 0417 by Billey Gosling D, RN Outcome: Progressing   Problem: Activity: Goal: Risk for activity intolerance will decrease 09/18/2023 0417 by Billey Gosling D, RN Outcome: Progressing 09/18/2023 0417 by Excell Seltzer, Opal Mckellips D, RN Outcome: Progressing   Problem: Nutrition: Goal: Adequate nutrition will be maintained 09/18/2023 0417 by Billey Gosling D, RN Outcome: Progressing 09/18/2023 0417 by Excell Seltzer, Elvin Mccartin D, RN Outcome: Progressing   Problem: Coping: Goal: Level of anxiety will decrease 09/18/2023 0417 by Billey Gosling D, RN Outcome: Progressing 09/18/2023 0417 by Excell Seltzer, Maliki Gignac D, RN Outcome: Progressing   Problem: Elimination: Goal: Will not experience complications related to bowel motility 09/18/2023 0417 by Billey Gosling D, RN Outcome: Progressing 09/18/2023 0417 by Excell Seltzer, Eliane Hammersmith D, RN Outcome: Progressing Goal: Will not experience complications related to urinary retention 09/18/2023 0417 by Billey Gosling D, RN Outcome: Progressing 09/18/2023 0417 by Excell Seltzer, Dnaiel Voller D, RN Outcome: Progressing   Problem: Pain Managment: Goal: General experience of comfort will improve 09/18/2023 0417 by Billey Gosling D, RN Outcome: Progressing 09/18/2023 0417 by Excell Seltzer, Raina Sole D, RN Outcome: Progressing   Problem: Safety: Goal: Ability to remain free from injury will improve 09/18/2023 0417 by Excell Seltzer, Obrien Huskins D, RN Outcome: Progressing 09/18/2023 0417 by Excell Seltzer, Joyelle Siedlecki D, RN Outcome: Progressing   Problem: Skin Integrity: Goal: Risk for impaired skin integrity will decrease 09/18/2023 0417 by Billey Gosling D, RN Outcome: Progressing 09/18/2023 0417 by Excell Seltzer, Madelina Sanda D, RN Outcome: Progressing   Problem: Education: Goal: Ability to describe self-care measures that may prevent or decrease complications (Diabetes Survival Skills Education) will improve 09/18/2023 0417 by Excell Seltzer, Nilsa Macht D, RN Outcome: Progressing 09/18/2023 0417 by Billey Gosling D, RN Outcome: Progressing Goal: Individualized Educational Video(s) 09/18/2023 0417 by Billey Gosling D, RN Outcome: Progressing 09/18/2023 0417 by Excell Seltzer, Shamere Campas D, RN Outcome: Progressing   Problem: Coping: Goal: Ability to adjust to condition or change in health will improve 09/18/2023 0417 by Excell Seltzer, Simaya Lumadue D, RN Outcome: Progressing 09/18/2023 0417 by Excell Seltzer, Lauralye Kinn D, RN Outcome: Progressing   Problem: Fluid Volume: Goal: Ability to maintain a balanced intake and output will improve 09/18/2023 0417 by Billey Gosling D, RN Outcome:  Progressing 09/18/2023 0417 by Excell Seltzer Riyan Gavina D, RN Outcome: Progressing   Problem: Health Behavior/Discharge Planning: Goal: Ability to identify and utilize available resources and services will improve 09/18/2023 0417 by Billey Gosling D, RN Outcome: Progressing 09/18/2023 0417 by Excell Seltzer Valerian Jewel D,  RN Outcome: Progressing Goal: Ability to manage health-related needs will improve 09/18/2023 0417 by Billey Gosling D, RN Outcome: Progressing 09/18/2023 0417 by Excell Seltzer, Giovanie Lefebre D, RN Outcome: Progressing   Problem: Metabolic: Goal: Ability to maintain appropriate glucose levels will improve 09/18/2023 0417 by Billey Gosling D, RN Outcome: Progressing 09/18/2023 0417 by Excell Seltzer, Bessie Livingood D, RN Outcome: Progressing   Problem: Nutritional: Goal: Maintenance of adequate nutrition will improve 09/18/2023 0417 by Billey Gosling D, RN Outcome: Progressing 09/18/2023 0417 by Excell Seltzer, Kinston Magnan D, RN Outcome: Progressing Goal: Progress toward achieving an optimal weight will improve 09/18/2023 0417 by Billey Gosling D, RN Outcome: Progressing 09/18/2023 0417 by Excell Seltzer, Mariateresa Batra D, RN Outcome: Progressing   Problem: Skin Integrity: Goal: Risk for impaired skin integrity will decrease 09/18/2023 0417 by Billey Gosling D, RN Outcome: Progressing 09/18/2023 0417 by Excell Seltzer, Nashaun Hillmer D, RN Outcome: Progressing   Problem: Tissue Perfusion: Goal: Adequacy of tissue perfusion will improve 09/18/2023 0417 by Billey Gosling D, RN Outcome: Progressing 09/18/2023 0417 by Excell Seltzer, Jonahtan Manseau D, RN Outcome: Progressing   Problem: Education: Goal: Ability to describe self-care measures that may prevent or decrease complications (Diabetes Survival Skills Education) will improve 09/18/2023 0417 by Excell Seltzer, Vergene Marland D, RN Outcome: Progressing 09/18/2023 0417 by Billey Gosling D, RN Outcome: Progressing Goal: Individualized Educational Video(s) 09/18/2023 0417 by Billey Gosling D, RN Outcome: Progressing 09/18/2023 0417 by  Excell Seltzer, Thedore Pickel D, RN Outcome: Progressing   Problem: Cardiac: Goal: Ability to maintain an adequate cardiac output will improve 09/18/2023 0417 by Billey Gosling D, RN Outcome: Progressing 09/18/2023 0417 by Excell Seltzer, Cephas Revard D, RN Outcome: Progressing   Problem: Health Behavior/Discharge Planning: Goal: Ability to identify and utilize available resources and services will improve 09/18/2023 0417 by Excell Seltzer, Tujuana Kilmartin D, RN Outcome: Progressing 09/18/2023 0417 by Excell Seltzer, Ansar Skoda D, RN Outcome: Progressing Goal: Ability to manage health-related needs will improve 09/18/2023 0417 by Excell Seltzer, Uthman Mroczkowski D, RN Outcome: Progressing 09/18/2023 0417 by Excell Seltzer, Cassian Torelli D, RN Outcome: Progressing   Problem: Fluid Volume: Goal: Ability to achieve a balanced intake and output will improve 09/18/2023 0417 by Billey Gosling D, RN Outcome: Progressing 09/18/2023 0417 by Excell Seltzer, Kyriakos Babler D, RN Outcome: Progressing   Problem: Metabolic: Goal: Ability to maintain appropriate glucose levels will improve Outcome: Progressing   Problem: Nutritional: Goal: Maintenance of adequate nutrition will improve Outcome: Progressing Goal: Maintenance of adequate weight for body size and type will improve Outcome: Progressing   Problem: Respiratory: Goal: Will regain and/or maintain adequate ventilation Outcome: Progressing

## 2023-09-18 NOTE — Progress Notes (Signed)
Received patient in bed to unit.  Alert and oriented.  Informed consent signed and in chart.   TX duration:3  Patient tolerated well.  Transported back to the room  Alert, without acute distress.  Hand-off given to patient's nurse.   Access used: avg Access issues: n/a  Total UF removed: 2L Medication(s) given: n/a Post HD weight: 128.5kg    09/18/23 1455  Vitals  Temp 97.9 F (36.6 C)  Temp Source Axillary  BP 135/68  MAP (mmHg) 88  Pulse Rate 85  ECG Heart Rate 85  Resp (!) 7  Oxygen Therapy  SpO2 100 %  O2 Device Nasal Cannula  O2 Flow Rate (L/min) 3 L/min  During Treatment Monitoring  Blood Flow Rate (mL/min) 399 mL/min  Arterial Pressure (mmHg) -191.1 mmHg  Venous Pressure (mmHg) 339.38 mmHg  TMP (mmHg) 11.71 mmHg  Ultrafiltration Rate (mL/min) 874 mL/min  Dialysate Flow Rate (mL/min) 300 ml/min  Duration of HD Treatment -hour(s) 3 hour(s)  Cumulative Fluid Removed (mL) per Treatment  2000.2  HD Safety Checks Performed Yes  Intra-Hemodialysis Comments Tx completed  Post Treatment  Dialyzer Clearance Lightly streaked  Hemodialysis Intake (mL) 0 mL  Liters Processed 72  Fluid Removed (mL) 2000 mL  Tolerated HD Treatment Yes  AVG/AVF Arterial Site Held (minutes) 5 minutes  AVG/AVF Venous Site Held (minutes) 5 minutes  Fistula / Graft Left Upper arm Arteriovenous vein graft  Placement Date/Time: (c) 04/22/23 (c) 1000   Orientation: Left  Access Location: Upper arm  Access Type: Arteriovenous vein graft  Site Condition No complications  Fistula / Graft Assessment Thrill;Bruit;Present  Status Deaccessed  Hemodialysis Catheter Left Internal jugular Double lumen Permanent (Tunneled)  Placement Date/Time: 07/18/23 1656   Serial / Lot #: [id number]  Expiration Date: 12/11/27  Time Out: Correct patient;Correct site;Correct procedure  Maximum sterile barrier precautions: Hand hygiene;Large sterile sheet;Cap;Sterile probe cover;Mask;St...  Site Condition No  complications  Blue Lumen Status Dead end cap in place  Red Lumen Status Dead end cap in place  Dressing Type Transparent  Dressing Status Antimicrobial disc in place  Drainage Description None  Dressing Change Due 09/19/23  Post treatment catheter status Capped and Clamped       [facility]
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
## 2023-09-18 NOTE — Inpatient Diabetes Management (Signed)
Inpatient Diabetes Program Recommendations  AACE/ADA: New Consensus Statement on Inpatient Glycemic Control (2015)  Target Ranges:  Prepandial:   less than 140 mg/dL      Peak postprandial:   less than 180 mg/dL (1-2 hours)      Critically ill patients:  140 - 180 mg/dL   Lab Results  Component Value Date   GLUCAP 387 (H) 09/18/2023   HGBA1C 11.1 (H) 09/17/2023    Review of Glycemic Control  Latest Reference Range & Units 09/17/23 17:12 09/17/23 21:05 09/18/23 06:17 09/18/23 07:48  Glucose-Capillary 70 - 99 mg/dL 147 (H) 829 (H) 562 (H) 387 (H)   Diabetes history: DM 2 Outpatient Diabetes medications:  U500 insulin- 70-125 units bid If CBG<100 mg/dL, take 70 units If CBG 130-865- take 125 units If >350 mg/dL- take 784 units   Current orders for Inpatient glycemic control:  Novolog 0-20 units tid with meals  Novolog 10 units tid with meals Semglee 30 units daily  Inpatient Diabetes Program Recommendations:    Note that patient was taking U500 insulin at home for DM management.  May consider increasing Semglee to 30 units bid or potentially restarting U500 70 units bid with meals(If U500 restarted may d/c Semglee and Novolog meal coverage).    Thanks,  Beryl Meager, RN, BC-ADM Inpatient Diabetes Coordinator Pager [phone]  (8a-5p)

## 2023-09-19 ENCOUNTER — Inpatient Hospital Stay (HOSPITAL_COMMUNITY): Admission: RE | Admit: 2023-09-19 | Payer: 59 | Source: Ambulatory Visit

## 2023-09-19 ENCOUNTER — Other Ambulatory Visit (HOSPITAL_COMMUNITY): Payer: Self-pay

## 2023-09-19 DIAGNOSIS — E1100 Type 2 diabetes mellitus with hyperosmolarity without nonketotic hyperglycemic-hyperosmolar coma (NKHHC): Secondary | ICD-10-CM | POA: Diagnosis not present

## 2023-09-19 LAB — CBC
HCT: 27.8 % — ABNORMAL LOW (ref 39.0–52.0)
Hemoglobin: 8.5 g/dL — ABNORMAL LOW (ref 13.0–17.0)
MCH: 27.5 pg (ref 26.0–34.0)
MCHC: 30.6 g/dL (ref 30.0–36.0)
MCV: 90 fL (ref 80.0–100.0)
Platelets: 105 10*3/uL — ABNORMAL LOW (ref 150–400)
RBC: 3.09 MIL/uL — ABNORMAL LOW (ref 4.22–5.81)
RDW: 16.3 % — ABNORMAL HIGH (ref 11.5–15.5)
WBC: 5.8 10*3/uL (ref 4.0–10.5)
nRBC: 0 % (ref 0.0–0.2)

## 2023-09-19 LAB — GLUCOSE, CAPILLARY
Glucose-Capillary: 354 mg/dL — ABNORMAL HIGH (ref 70–99)
Glucose-Capillary: 309 mg/dL — ABNORMAL HIGH (ref 70–99)
Glucose-Capillary: 350 mg/dL — ABNORMAL HIGH (ref 70–99)
Glucose-Capillary: 241 mg/dL — ABNORMAL HIGH (ref 70–99)
Glucose-Capillary: 169 mg/dL — ABNORMAL HIGH (ref 70–99)

## 2023-09-19 LAB — PHOSPHORUS: Phosphorus: 5.4 mg/dL — ABNORMAL HIGH (ref 2.5–4.6)

## 2023-09-19 LAB — HEPATITIS B SURFACE ANTIBODY, QUANTITATIVE: Hep B S AB Quant (Post): 44.3 m[IU]/mL

## 2023-09-19 LAB — MAGNESIUM: Magnesium: 1.7 mg/dL (ref 1.7–2.4)

## 2023-09-19 MED ORDER — LIDOCAINE HCL 1 % IJ SOLN
10.0000 mL | Freq: Once | INTRAMUSCULAR | Status: AC
  Administered 2023-09-19: 10 mL
  Filled 2023-09-19: qty 10

## 2023-09-19 MED ORDER — INSULIN REGULAR HUMAN (CONC) 500 UNIT/ML ~~LOC~~ SOPN
70.0000 [IU] | PEN_INJECTOR | Freq: Two times a day (BID) | SUBCUTANEOUS | Status: DC
  Administered 2023-09-19 – 2023-09-20 (×3): 70 [IU] via SUBCUTANEOUS
  Filled 2023-09-19: qty 3

## 2023-09-19 NOTE — Progress Notes (Signed)
[facility] Progress Note    Assessment/ Plan:   ESRD  -outpatient HD orders: discharged from outpatient dialysis unit in Dec 2023 for behavioral issues. Dialyzes here, presents through ER on TTS. 3hrs 15 min. RUE AVG, heparin 2000 units. EDW 129kg -HD tomorrow vs Thursday depending on schedule/clinical status (had HD yesterday, holding off on resuming schedule today given HD staffing issues today)  Chest pain -workup and management per primary service, cardiology has been consulted  Volume/ hypertension  -EDW 129kg. Will UF as tolerated  Anemia of Chronic Kidney Disease -Hemoglobin 8.7. s/p Aranesp 200 mcg 10/21 -Transfuse PRN for Hgb <7  Secondary Hyperparathyroidism/Hyperphosphatemia -resume home binders -renal diet -monitor PO4  Vascular access -LUE AVG has been used without issues recently, is supposed to get [facility] out 10/22 with VVS. Discussed with VVS today, they will see him  Subjective:   Patient seen and examined bedside.  He reports that he tolerated dialysis yesterday.  Net UF 2 L.  No acute events.   Objective:   BP (!) 148/61 (BP Location: Right Leg)   Pulse 86   Temp 97.8 F (36.6 C) (Oral)   Resp (!) 9   Ht 5\' 5"  (1.651 m)   Wt 128.5 kg   SpO2 100%   BMI 47.14 kg/m   Intake/Output Summary (Last 24 hours) at 09/19/2023 8295 Last data filed at 09/18/2023 2200 Gross per 24 hour  Intake 240 ml  Output 2000 ml  Net -1760 ml   Weight change: -0.2 kg  Physical Exam: Gen: NAD, sitting in recliner CVS: RRR Resp: Normal work of breathing, unlabored Abd: Obese Ext: Left AKA, no sig edema b/l Les Neuro: awake Dialysis access: +TDC, LUE AVG +b/t  Imaging: ECHOCARDIOGRAM COMPLETE  Result Date: 09/17/2023    ECHOCARDIOGRAM REPORT   Patient Name:   Jeffrey [name] Date of Exam: 09/17/2023 Medical Rec #:  [id number]     Height:       65.0 in Accession #:    [id number]    Weight:       282.8 lb Date of Birth:  04/15/1961     BSA:          2.292  m Patient Age:    62 years      BP:           140/44 mmHg Patient Gender: M             HR:           85 bpm. Exam Location:  Inpatient Procedure: 2D Echo, Color Doppler, Cardiac Doppler and Intracardiac            Opacification Agent Indications:    R07.9* Chest pain, unspecified  History:        Patient has prior history of Echocardiogram examinations, most                 recent 06/01/2023. CHF, COPD; Risk Factors:Hypertension, Diabetes,                 Dyslipidemia and Sleep Apnea.  Sonographer:    Irving Burton Senior RDCS Referring Phys: [id number] Angie Fava  Sonographer Comments: Technically difficult due to patient body habitus. IMPRESSIONS  1. Left ventricular ejection fraction, by estimation, is 60 to 65%. The left ventricle has normal function. The left ventricle has no regional wall motion abnormalities. Left ventricular diastolic parameters are consistent with Grade I diastolic dysfunction (impaired relaxation).  2. Right ventricular systolic function was not well visualized.  The right ventricular size is not well visualized. Tricuspid regurgitation signal is inadequate for assessing PA pressure.  3. Left atrial size was mild to moderately dilated.  4. The mitral valve is normal in structure. No evidence of mitral valve regurgitation. No evidence of mitral stenosis.  5. The aortic valve has an indeterminant number of cusps. There is moderate calcification of the aortic valve. There is moderate thickening of the aortic valve. Aortic valve regurgitation is not visualized. Aortic valve sclerosis/calcification is present, without any evidence of aortic stenosis.  6. The inferior vena cava is normal in size with greater than 50% respiratory variability, suggesting right atrial pressure of 3 mmHg. Comparison(s): No prior Echocardiogram. FINDINGS  Left Ventricle: Left ventricular ejection fraction, by estimation, is 60 to 65%. The left ventricle has normal function. The left ventricle has no regional wall motion  abnormalities. Definity contrast agent was given IV to delineate the left ventricular  endocardial borders. The left ventricular internal cavity size was normal in size. There is no left ventricular hypertrophy. Left ventricular diastolic parameters are consistent with Grade I diastolic dysfunction (impaired relaxation). Normal left ventricular filling pressure. Right Ventricle: The right ventricular size is not well visualized. No increase in right ventricular wall thickness. Right ventricular systolic function was not well visualized. Tricuspid regurgitation signal is inadequate for assessing PA pressure. Left Atrium: Left atrial size was mild to moderately dilated. Right Atrium: Right atrial size was not well visualized. Pericardium: Trivial pericardial effusion is present. Mitral Valve: The mitral valve is normal in structure. No evidence of mitral valve regurgitation. No evidence of mitral valve stenosis. Tricuspid Valve: The tricuspid valve is normal in structure. Tricuspid valve regurgitation is trivial. No evidence of tricuspid stenosis. Aortic Valve: The aortic valve has an indeterminant number of cusps. There is moderate calcification of the aortic valve. There is moderate thickening of the aortic valve. Aortic valve regurgitation is not visualized. Aortic valve sclerosis/calcification  is present, without any evidence of aortic stenosis. Pulmonic Valve: The pulmonic valve was not well visualized. Pulmonic valve regurgitation is not visualized. No evidence of pulmonic stenosis. Aorta: The aortic root and ascending aorta are structurally normal, with no evidence of dilitation. Venous: The inferior vena cava is normal in size with greater than 50% respiratory variability, suggesting right atrial pressure of 3 mmHg. IAS/Shunts: The interatrial septum was not well visualized.  LEFT VENTRICLE PLAX 2D LVIDd:         5.00 cm   Diastology LVIDs:         3.20 cm   LV e' medial:    5.88 cm/s LV PW:         0.90 cm    LV E/e' medial:  14.8 LV IVS:        0.80 cm   LV e' lateral:   5.87 cm/s LVOT diam:     2.10 cm   LV E/e' lateral: 14.8 LV SV:         62 LV SV Index:   27 LVOT Area:     3.46 cm  LEFT ATRIUM              Index LA diam:        3.80 cm  1.66 cm/m LA Vol (A2C):   38.9 ml  16.97 ml/m LA Vol (A4C):   103.0 ml 44.94 ml/m LA Biplane Vol: 64.0 ml  27.92 ml/m  AORTIC VALVE LVOT Vmax:   85.20 cm/s LVOT Vmean:  56.700 cm/s LVOT VTI:  0.179 m  AORTA Ao Root diam: 3.00 cm Ao Asc diam:  3.00 cm MITRAL VALVE MV Area (PHT): 2.95 cm    SHUNTS MV Decel Time: 257 msec    Systemic VTI:  0.18 m MV E velocity: 87.00 cm/s  Systemic Diam: 2.10 cm MV A velocity: 99.10 cm/s MV E/A ratio:  0.88 Vishnu Priya Mallipeddi Electronically signed by Winfield Rast Mallipeddi Signature Date/Time: 09/17/2023/11:59:40 AM    Final     Labs: BMET Recent Labs  Lab 09/12/23 0930 09/15/23 2146 09/17/23 0227 09/18/23 0230 09/18/23 0959  NA 131* 132* 133* 128* 126*  K 4.3 4.0 3.8 4.5 4.3  CL 91* 88* 90* 87* 86*  CO2 25 26 28 28 24   GLUCOSE 484* 318* 374* 404* 451*  BUN 88* 91* 67* 78* 83*  CREATININE 5.80* 5.64* 5.66* 6.86* 7.24*  CALCIUM 8.0* 8.1* 8.1* 8.1* 7.9*  PHOS 4.7*  --  5.6* 6.6* 6.5*   CBC Recent Labs  Lab 09/15/23 2146 09/17/23 0227 09/18/23 0230 09/18/23 0959  WBC 7.9 6.4 6.2 5.9  NEUTROABS 6.3  --   --   --   HGB 8.1* 8.6* 8.2* 8.7*  HCT 24.4* 27.0* 26.2* 27.3*  MCV 85.0 86.5 87.3 89.8  PLT 127* 144* 119* 114*    Medications:     acetaminophen  650 mg Oral Q6H   amLODipine  10 mg Oral q morning   ascorbic acid  500 mg Oral Daily   aspirin EC  81 mg Oral q AM   atorvastatin  40 mg Oral q AM   calcium acetate  1,334 mg Oral TID with meals   carvedilol  12.5 mg Oral BID WC   Chlorhexidine Gluconate Cloth  6 each Topical Q0600   cholecalciferol  2,000 Units Oral Daily   clopidogrel  75 mg Oral Daily   vitamin B-12  500 mcg Oral Daily   folic acid  1 mg Oral Daily   heparin injection  (subcutaneous)  5,000 Units Subcutaneous Q8H   insulin aspart  0-20 Units Subcutaneous TID WC   insulin aspart  10 Units Subcutaneous TID WC   insulin glargine-yfgn  30 Units Subcutaneous BID   iron polysaccharides  150 mg Oral Daily   mupirocin ointment  1 Application Nasal BID   nortriptyline  20 mg Oral QHS   pantoprazole  40 mg Oral QAC breakfast   pregabalin  75 mg Oral QHS   sertraline  100 mg Oral q AM   tamsulosin  0.4 mg Oral Daily   torsemide  120 mg Oral BID   traZODone  150 mg Oral QHS   Vitamin D (Ergocalciferol)  50,000 Units Oral Q7 days      Anthony Sar, MD [facility] 09/19/2023, 8:12 AM

## 2023-09-19 NOTE — Progress Notes (Addendum)
  Procedure Note   PRE-OPERATIVE DIAGNOSIS:   ESRD with functioning LUE AV graft   POST-OPERATIVE DIAGNOSIS:  ESRD with functioning LUE AV graft  PROCEDURE:  Removal of left IJ tunneled dialysis catheter   PROVIDER:   Corrie Chrstopher Malenfant PA-C.  Consent Signed: yes Time Out Completed: Yes On Anticoagulation: No  Procedure: Consent obtained Sterile prepping and draping over left upper chest and neck area 2.5 cc of 1% lidocaine plain was injected over removal site and around cuff Left IJ catheter was removed using hemostat and scissors with cuff intact Pressure held in left supraclavicular region and over catheter exit site for 5 minutes to ensure hemostasis Patient was maintained at 45 degree position and clean dressings were applied to exit site Patient tolerated procedure well without any complications  Instructions were provided to the patient regarding wound care and what to do if possible bleeding  Graceann Congress, PA-C [facility] [phone] 09/19/2023  9:37 AM

## 2023-09-19 NOTE — TOC Initial Note (Signed)
Transition of Care (TOC) - Initial/Assessment Note   Spoke to patient at bedside. Patient from home with daughter. Has home oxygen with [employer].   Daughter can provide transportation home at discharge and bring portable oxygen DME  Patient Details  Name: Jeffrey [name] MRN: [id number] Date of Birth: [birth year]  Transition of Care [facility]) CM/SW Contact:    Kingsley Plan, RN Phone Number: 09/19/2023, 11:32 AM  Clinical Narrative:                   Expected Discharge Plan: Home/Self Care Barriers to Discharge: Continued Medical Work up   Patient Goals and CMS Choice Patient states their goals for this hospitalization and ongoing recovery are:: to return to home   Choice offered to / list presented to : NA      Expected Discharge Plan and Services   Discharge Planning Services: CM Consult Post Acute Care Choice: NA Living arrangements for the past 2 months: Single Family Home                 DME Arranged: N/A DME Agency: NA       HH Arranged: NA          Prior Living Arrangements/Services Living arrangements for the past 2 months: Single Family Home Lives with:: Adult Children Patient language and need for interpreter reviewed:: Yes Do you feel safe going back to the place where you live?: Yes      Need for Family Participation in Patient Care: No (Comment) Care giver support system in place?: Yes (comment) Current home services: DME Criminal Activity/Legal Involvement Pertinent to Current Situation/Hospitalization: No - Comment as needed  Activities of Daily Living   ADL Screening (condition at time of admission) Independently performs ADLs?: No Does the patient have a NEW difficulty with bathing/dressing/toileting/self-feeding that is expected to last >3 days?: No Does the patient have a NEW difficulty with getting in/out of bed, walking, or climbing stairs that is expected to last >3 days?: No Does the patient have a NEW difficulty with communication that  is expected to last >3 days?: No Is the patient deaf or have difficulty hearing?: No Does the patient have difficulty seeing, even when wearing glasses/contacts?: No Does the patient have difficulty concentrating, remembering, or making decisions?: No  Permission Sought/Granted   Permission granted to share information with : No              Emotional Assessment Appearance:: Appears stated age Attitude/Demeanor/Rapport: Self-Confident Affect (typically observed): Accepting Orientation: : Oriented to Self, Oriented to Place, Oriented to  Time, Oriented to Situation Alcohol / Substance Use: Not Applicable Psych Involvement: No (comment)  Admission diagnosis:  ESRD (end stage renal disease) (HCC) [N18.6] Chest pain [R07.9] Nonspecific chest pain [R07.9] Hyperosmolar hyperglycemic state (HHS) (HCC) [E11.00] Patient Active Problem List   Diagnosis Date Noted   Hyperosmolar hyperglycemic state (HHS) (HCC) 09/17/2023   Chest pain 09/16/2023   Visual field defect of right eye 07/20/2023   Anemia due to chronic kidney disease, on chronic dialysis (HCC) 07/19/2023   Hyperuricemia 07/19/2023   Left arm pain 07/17/2023   Shortness of breath 06/01/2023   Hyperglycemia due to diabetes mellitus (HCC) 04/06/2023   Accelerated hypertension 04/06/2023   ESRD on dialysis (HCC) 03/03/2023   Acute on chronic respiratory failure with hypoxia (HCC) 11/24/2022   Uremia 11/24/2022   Encounter for removal of sutures 07/15/2022   Stress reaction of bone 07/12/2022   Benign prostatic hyperplasia with weak urinary stream  12/30/2021   Swelling of right upper extremity 11/07/2021   S/P reverse total shoulder arthroplasty, right 10/27/2021   AV graft malfunction (HCC) 09/01/2021   Dialysis AV fistula malfunction, initial encounter (HCC) 08/30/2021   Mood disorder (HCC) 08/30/2021   Disorder of ligament, right ankle 08/03/2021   Pain in right ankle and joints of right foot 08/03/2021   Local  infection due to central venous catheter, sequela 07/21/2021   Acquired absence of left leg below knee (HCC) 06/21/2021   S/P AKA (above knee amputation), left (HCC) 05/04/2021   Bacteremia 04/20/2021   Other specified joint disorders, left knee 04/20/2021   Iron deficiency anemia, unspecified 04/13/2021   Anemia of chronic disease    Infection of prosthetic left knee joint (HCC)    MRSA bacteremia 03/30/2021   Left knee pain    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    Fever 03/29/2021   End stage renal disease (HCC) 03/29/2021   COPD (chronic obstructive pulmonary disease) (HCC) 03/29/2021   Type 2 diabetes mellitus with hyperlipidemia (HCC) 03/29/2021   PTSD (post-traumatic stress disorder) 03/29/2021   Dyspnea, unspecified 10/12/2020   Pain in arm, unspecified 10/12/2020   Mild protein-calorie malnutrition (HCC) 09/06/2020   Status post cervical spinal fusion 08/31/2020   Controlled substance agreement signed 08/30/2020   Chronic pain 08/20/2020   Secondary hyperparathyroidism of renal origin (HCC) 08/15/2020   Allergy, unspecified, sequela 07/21/2020   Coagulation defect, unspecified (HCC) 07/21/2020   Encounter for immunization 07/21/2020   Personal history of anaphylaxis 07/21/2020   Cervical myelopathy (HCC) 07/03/2020   Bilateral bunions 05/08/2020   Hammer toes of both feet 05/08/2020   Onychomycosis of toenail 05/08/2020   Muscle cramps 05/07/2020   History of 2019 novel coronavirus disease (COVID-19) 11/06/2019   Cervical radiculopathy 10/28/2019   Volume overload 06/25/2019   Degenerative disc disease, cervical 04/02/2019   History of MRSA infection 10/04/2018   Spinal stenosis of lumbosacral region 08/28/2018   Primary osteoarthritis of both shoulders 02/12/2018   Rotator cuff arthropathy of both shoulders 02/12/2018   Lumbar degenerative disc disease 01/25/2018   Chronic midline low back pain without sciatica 12/15/2017   History of colon  polyps 08/15/2016   Mild episode of recurrent major depressive disorder (HCC) 05/30/2016   Vitamin D deficiency 04/18/2016   Chronic insomnia 12/21/2015   Pulmonary hypertension, mild (HCC) 04/29/2014   Chronic gastritis without bleeding 01/13/2014   Diabetic polyneuropathy associated with type 2 diabetes mellitus (HCC) 01/13/2014   Chronic diastolic heart failure (HCC) 12/24/2013   OSA (obstructive sleep apnea) 12/24/2013   Primary osteoarthritis of both knees 08/26/2013   Primary osteoarthritis of right knee 08/26/2013   Status post total bilateral knee replacement 05/02/2013   Mixed hyperlipidemia 07/30/2012   Class 3 severe obesity due to excess calories with serious comorbidity and body mass index (BMI) of 45.0 to 49.9 in adult (HCC) 07/27/2012   Gastroesophageal reflux disease without esophagitis 05/12/2008   Erectile dysfunction due to diseases classified elsewhere 01/19/2005   PCP:  Knox Royalty, MD Pharmacy:   [id number] [place], [place] - [address] AT [facility] [address] [place] [address] Phone: [phone] Fax: [phone]  [facility] [address] [place] [address] Phone: [phone] Fax: [phone]     Social Determinants of Health (SDOH) Social History: SDOH Screenings   Food Insecurity: No Food Insecurity (09/16/2023)  Housing: Low Risk  (09/16/2023)  Transportation Needs: No Transportation Needs (09/16/2023)  Utilities: Not At Risk (09/16/2023)  Financial Resource Strain: Medium Risk (09/05/2022)   Received from [facility], [facility]  Physical Activity: Inactive (09/08/2021)   Received from [facility], [facility]  Social Connections: Moderately Isolated (09/07/2022)   Received from [facility], [facility]  Stress: Stress Concern Present  (09/07/2022)   Received from [facility], [facility]  Tobacco Use: Medium Risk (09/16/2023)   SDOH Interventions:     Readmission Risk Interventions    06/02/2023    4:31 PM 04/24/2023    4:36 PM 11/03/2022   10:19 AM  Readmission Risk Prevention Plan  Transportation Screening Complete Complete Complete  Medication Review (RN Care Manager) Complete Referral to Pharmacy Complete  PCP or Specialist appointment within 3-5 days of discharge Complete Complete --  HRI or Home Care Consult Complete Complete Complete  SW Recovery Care/Counseling Consult Complete Complete Complete  Palliative Care Screening Not Applicable Not Applicable Not Applicable  Skilled Nursing Facility Not Applicable Not Applicable Not Applicable

## 2023-09-19 NOTE — Progress Notes (Signed)
Pharmacy Home Medication Reconciliation Communication High Risk Medication: Humulin U-500 Insulin  Home dose of Humulin U-500 insulin was reordered for this patient. The dose was verified with the patient as listed below:  Type of syringe used at home:  [facility] Number or line on syringe to which patient draws up insulin: depends on cBG (anywhere from 70-140 units based on sliding scale) -If cBG< 100: 70 units, if cBG 100-350: 125 units, if cBG >350: take 140 units.  This equates to 70-140 units of U-500 insulin (see comment above)  Discussed with hospitalist and DM coordinator - will order as 70 units twice daily and monitor closely while inpatient.   Thank you for allowing pharmacy to participate in this patient's care,  Sherron Monday, PharmD, BCCCP Clinical Pharmacist  Phone: [phone] 09/19/2023 10:14 AM  Please check AMION for all [facility] Pharmacy phone numbers After 10:00 PM, call Main Pharmacy [phone]

## 2023-09-19 NOTE — Progress Notes (Signed)
[facility] Progress Note  Patient: Jeffrey [name]    ZOX:[id number]  DOA: 09/15/2023     Date of Service: the patient was seen and examined on 09/19/2023  Chief Complaint  Patient presents with   Chest Pain    Patient to ED via EMS with complaint of chest pain x 1 hour. Pain is in center chest radiates to both arms. Patient missed dialysis yesterday, normally dialyzes Tues. Thur. And Sat. Ems reports CBG 541. EMS gave 324 ASA and 0.4 SL nitroglycerin. Patient has 20 RAC. Patient is fully alert and oriented.   Brief hospital course: Jeffrey [name] is a 62 y.o. male with medical history significant of end-stage renal disease on hemodialysis on Tuesday, Thursdays and Saturdays, obstructive sleep apnea, COPD, chronic hypoxic respiratory failure on 3 L/min of oxygen at home diabetes mellitus type 2 on insulin, peripheral arterial disease status post left above-knee amputation and history of congestive heart failure.  Patient presented to the emergency room last night with complaints of upper extremity cramps radiating through his back.  Patient has periodic spasms but symptoms from last night was the worst he had ever experienced.  Patient apparently had missed his last session of hemodialysis on Thursday.  He also complained of chest tightness and shortness of breath which is chronic.  He denied any associated nausea, vomiting or palpitations.   On presentation, patient was emergently sent for hemodialysis.  He completed for about 4-hour session.  Patient reports minimal improvement with his symptoms postdialysis.  He is yet to receive his diazepam and Flexeril for muscle spasms.   In the ED, CT scan of the chest done ruled out pulm emboli or aortic dissection.  EKG was also done and essentially unrevealing for any evidence of acute ischemia.  He denies any prior history of coronary disease.  Last echo from July 2024 showed preserved left ventricular ejection fraction of 55 to 60% with no obvious  wall motion abnormalities.  Mild aortic sclerosis however were noted.     Assessment and Plan: #  Acute chest pain: Thought to be likely atypical in etiology most likely musculoskeletal.  Patient had experienced upper extremity cramps and back cramps with associated symptoms.  Cardiac markers thus far has been unremarkable.  EKG unrevealing.  Last echocardiogram showed preserved left ventricular ejection fraction with no wall motion abnormalities.  Patient has been admitted to progressive care unit for monitoring.  Cardiology was consulted on 1019 but patient was not seen.  Patient seems to be stable, so I did not consult cardiology.  Patient can follow as an outpatient.  Current home regimen of diazepam, Lyrica and Flexeril.  Patient will also continue with aspirin, Plavix and statins.   #  End-stage renal dialysis: Frequent ER visits for hemodialysis.  Last session was  4 days prior.  Missed his last dialysis session due to some social issues.  Patient was emergently dialyzed at night on presentation in the ER.  Consulted nephrology for further treatment and management.   #  Type 2 diabetes mellitus on insulin therapy: uncontrolled.  A1c 11.1 10/19 last night blood sugar was elevated, patient was started on insulin IV infusion.  At home patient was using insulin U-500 70 units twice daily with meals 10/20 transition to subcu basal and sliding scale, DC'd insulin infusion 10/21 increased Semglee 30 units twice daily and NovoLog 10 units 3 times daily with sliding scale. CBG, continue diabetic and renal diet 10/22 as per DM coordinator, started Humulin R 70  units twice daily as patient was using home and continue NovoLog sliding scale.    # Peripheral arterial disease complicated by left above-knee amputation Patient is on aspirin and Plavix     # COPD with chronic respiratory failure.  Currently on 3 L/min of oxygen.  Clinically looks stable.  Not in any acute exacerbation.  As needed  bronchodilators advised.   # Obstructive sleep apnea: Patient is advised to have family bring in his CPAP machine   # Anemia of chronic disease: Likely due to end-stage renal disease. Continue darbepoetin as per nephro Iron deficiency, transferrin saturation 15%.  Started oral iron supplement with vitamin C. Follow with PCP to repeat iron profile after 3 to 6 months  Folic acid deficiency, folic acid level 5.1, started folic acid 1 mg p.o. daily.  Follow with PCP to repeat folic acid level after 3 to 6 months.  Vitamin D deficiency: started vitamin D 50,000 units p.o. weekly, follow with PCP to repeat vitamin D level after 3 to 6 months.  Vitamin B12 level 390, goal >400, started vitamin B12 500 mcg p.o. daily to prevent from deficiency.   Morbid obesity Body mass index is 47.14 kg/m.  Interventions: Calorie restricted diet and daily exercise advised to lose body weight.  Lifestyle modification discussed.  Diet: Carb modified and renal diet DVT Prophylaxis: Subcutaneous Heparin    Advance goals of care discussion: Full code  Family Communication: family was not present at bedside, at the time of interview.  The pt provided permission to discuss medical plan with the family. Opportunity was given to ask question and all questions were answered satisfactorily.   Disposition:  Pt is from Home, admitted with SOB, and chest pain, found to have uncontrolled diabetes, started insulin infusion, still has uncontrolled diabetes, which precludes a safe discharge. Discharge to home, when stable, may need few days more to stay in the hospital.  Can be discharged when blood glucose is controlled and cleared by nephrology as well.  Subjective: Patient was seen and examined at bedside.  Patient still has mild shortness of breath, overall feels improvement, denied any active issues.  Blood sugar is still fluctuating, will continue to manage as above.    Physical Exam: General: NAD, lying  comfortably Appear in no distress, affect appropriate Eyes: PERRLA ENT: Oral Mucosa Clear, moist  Neck: no JVD,  Cardiovascular: S1 and S2 Present, no Murmur,  Respiratory: good respiratory effort, Bilateral Air entry equal and Decreased, no Crackles, no wheezes Abdomen: Bowel Sound present, Soft but obese and no tenderness,  Skin: no rashes Extremities: s/p Left AKA, no Pedal edema, no calf tenderness Neurologic: without any new focal findings Gait not checked due to patient safety concerns  Vitals:   09/18/23 2335 09/19/23 0358 09/19/23 0717 09/19/23 1039  BP: (!) 154/71 (!) 144/72 (!) 148/61 (!) 136/99  Pulse: 89 86    Resp: 10 14 (!) 9 18  Temp: 97.6 F (36.4 C) 97.9 F (36.6 C) 97.8 F (36.6 C) 97.8 F (36.6 C)  TempSrc: Axillary Oral Oral Oral  SpO2: 97% 100% 100% 100%  Weight:  128.5 kg    Height:        Intake/Output Summary (Last 24 hours) at 09/19/2023 1251 Last data filed at 09/18/2023 2200 Gross per 24 hour  Intake 240 ml  Output 2000 ml  Net -1760 ml   Filed Weights   09/18/23 1138 09/18/23 1527 09/19/23 0358  Weight: 130.6 kg 128.5 kg 128.5 kg  Data Reviewed: I have personally reviewed and interpreted daily labs, tele strips, imagings as discussed above. I reviewed all nursing notes, pharmacy notes, vitals, pertinent old records I have discussed plan of care as described above with RN and patient/family.  CBC: Recent Labs  Lab 09/15/23 2146 09/17/23 0227 09/18/23 0230 09/18/23 0959 09/19/23 0943  WBC 7.9 6.4 6.2 5.9 5.8  NEUTROABS 6.3  --   --   --   --   HGB 8.1* 8.6* 8.2* 8.7* 8.5*  HCT 24.4* 27.0* 26.2* 27.3* 27.8*  MCV 85.0 86.5 87.3 89.8 90.0  PLT 127* 144* 119* 114* 105*   Basic Metabolic Panel: Recent Labs  Lab 09/15/23 2146 09/17/23 0227 09/18/23 0230 09/18/23 0959 09/19/23 0943  NA 132* 133* 128* 126*  --   K 4.0 3.8 4.5 4.3  --   CL 88* 90* 87* 86*  --   CO2 26 28 28 24   --   GLUCOSE 318* 374* 404* 451*  --   BUN 91*  67* 78* 83*  --   CREATININE 5.64* 5.66* 6.86* 7.24*  --   CALCIUM 8.1* 8.1* 8.1* 7.9*  --   MG  --  1.8 1.7  --  1.7  PHOS  --  5.6* 6.6* 6.5* 5.4*    Studies: No results found.  Scheduled Meds:  acetaminophen  650 mg Oral Q6H   amLODipine  10 mg Oral q morning   ascorbic acid  500 mg Oral Daily   aspirin EC  81 mg Oral q AM   atorvastatin  40 mg Oral q AM   calcium acetate  1,334 mg Oral TID with meals   carvedilol  12.5 mg Oral BID WC   Chlorhexidine Gluconate Cloth  6 each Topical Q0600   cholecalciferol  2,000 Units Oral Daily   clopidogrel  75 mg Oral Daily   vitamin B-12  500 mcg Oral Daily   folic acid  1 mg Oral Daily   heparin injection (subcutaneous)  5,000 Units Subcutaneous Q8H   insulin aspart  0-20 Units Subcutaneous TID WC   insulin regular human CONCENTRATED  70 Units Subcutaneous BID WC   iron polysaccharides  150 mg Oral Daily   lidocaine  10 mL Infiltration Once   mupirocin ointment  1 Application Nasal BID   nortriptyline  20 mg Oral QHS   pantoprazole  40 mg Oral QAC breakfast   pregabalin  75 mg Oral QHS   sertraline  100 mg Oral q AM   tamsulosin  0.4 mg Oral Daily   torsemide  120 mg Oral BID   traZODone  150 mg Oral QHS   Vitamin D (Ergocalciferol)  50,000 Units Oral Q7 days   Continuous Infusions:   PRN Meds: acetaminophen **OR** acetaminophen, calcium carbonate (dosed in mg elemental calcium), cyclobenzaprine, dextrose, diazepam, fentaNYL (SUBLIMAZE) injection, melatonin, naLOXone (NARCAN)  injection, nitroGLYCERIN, ondansetron (ZOFRAN) IV, mouth rinse, oxyCODONE  Time spent: 55 minutes  Author: Gillis Santa. MD [facility] 09/19/2023 12:51 PM  To reach On-call, see care teams to locate the attending and reach out to them via [email or web address]. If 7PM-7AM, please contact night-coverage If you still have difficulty reaching the attending provider, please page the [facility] (Director on Call) for [facility] on amion for assistance.

## 2023-09-19 NOTE — Plan of Care (Signed)
  Problem: Education: Goal: Knowledge of General Education information will improve Description: Including pain rating scale, medication(s)/side effects and non-pharmacologic comfort measures Outcome: Progressing   Problem: Health Behavior/Discharge Planning: Goal: Ability to manage health-related needs will improve Outcome: Progressing   Problem: Clinical Measurements: Goal: Ability to maintain clinical measurements within normal limits will improve Outcome: Progressing Goal: Will remain free from infection Outcome: Progressing Goal: Diagnostic test results will improve Outcome: Progressing Goal: Respiratory complications will improve Outcome: Progressing Goal: Cardiovascular complication will be avoided Outcome: Progressing   Problem: Activity: Goal: Risk for activity intolerance will decrease Outcome: Progressing   Problem: Nutrition: Goal: Adequate nutrition will be maintained Outcome: Progressing   Problem: Coping: Goal: Level of anxiety will decrease Outcome: Progressing   Problem: Elimination: Goal: Will not experience complications related to bowel motility Outcome: Progressing Goal: Will not experience complications related to urinary retention Outcome: Progressing   Problem: Pain Managment: Goal: General experience of comfort will improve Outcome: Progressing   Problem: Safety: Goal: Ability to remain free from injury will improve Outcome: Progressing   Problem: Skin Integrity: Goal: Risk for impaired skin integrity will decrease Outcome: Progressing   Problem: Education: Goal: Ability to describe self-care measures that may prevent or decrease complications (Diabetes Survival Skills Education) will improve Outcome: Progressing Goal: Individualized Educational Video(s) Outcome: Progressing   Problem: Coping: Goal: Ability to adjust to condition or change in health will improve Outcome: Progressing   Problem: Fluid Volume: Goal: Ability to  maintain a balanced intake and output will improve Outcome: Progressing   Problem: Health Behavior/Discharge Planning: Goal: Ability to identify and utilize available resources and services will improve Outcome: Progressing Goal: Ability to manage health-related needs will improve Outcome: Progressing   Problem: Metabolic: Goal: Ability to maintain appropriate glucose levels will improve Outcome: Progressing   Problem: Nutritional: Goal: Maintenance of adequate nutrition will improve Outcome: Progressing Goal: Progress toward achieving an optimal weight will improve Outcome: Progressing   Problem: Skin Integrity: Goal: Risk for impaired skin integrity will decrease Outcome: Progressing   Problem: Tissue Perfusion: Goal: Adequacy of tissue perfusion will improve Outcome: Progressing   Problem: Education: Goal: Ability to describe self-care measures that may prevent or decrease complications (Diabetes Survival Skills Education) will improve Outcome: Progressing Goal: Individualized Educational Video(s) Outcome: Progressing   Problem: Cardiac: Goal: Ability to maintain an adequate cardiac output will improve Outcome: Progressing   Problem: Health Behavior/Discharge Planning: Goal: Ability to identify and utilize available resources and services will improve Outcome: Progressing Goal: Ability to manage health-related needs will improve Outcome: Progressing   Problem: Fluid Volume: Goal: Ability to achieve a balanced intake and output will improve Outcome: Progressing   Problem: Metabolic: Goal: Ability to maintain appropriate glucose levels will improve Outcome: Progressing   Problem: Nutritional: Goal: Maintenance of adequate nutrition will improve Outcome: Progressing Goal: Maintenance of adequate weight for body size and type will improve Outcome: Progressing   Problem: Respiratory: Goal: Will regain and/or maintain adequate ventilation Outcome: Progressing    Problem: Urinary Elimination: Goal: Ability to achieve and maintain adequate renal perfusion and functioning will improve Outcome: Progressing   

## 2023-09-19 NOTE — Inpatient Diabetes Management (Addendum)
Inpatient Diabetes Program Recommendations  AACE/ADA: New Consensus Statement on Inpatient Glycemic Control (2015)  Target Ranges:  Prepandial:   less than 140 mg/dL      Peak postprandial:   less than 180 mg/dL (1-2 hours)      Critically ill patients:  140 - 180 mg/dL   Lab Results  Component Value Date   GLUCAP 354 (H) 09/19/2023   HGBA1C 11.1 (H) 09/17/2023    Review of Glycemic Control  Latest Reference Range & Units 09/18/23 07:48 09/18/23 11:09 09/18/23 16:39 09/18/23 21:05 09/19/23 06:01  Glucose-Capillary 70 - 99 mg/dL 213 (H) 086 (H) 578 (H) 303 (H) 354 (H)  Diabetes history: DM 2 Outpatient Diabetes medications:  U500 insulin- 70-125 units bid If CBG<100 mg/dL, take 70 units If CBG 469-629- take 125 units If >350 mg/dL- take 528 units    Current orders for Inpatient glycemic control:  Novolog 0-20 units tid with meals  Novolog 10 units tid with meals Semglee 30 units bid  Inpatient Diabetes Program Recommendations:    Blood sugars continue to be >goal.  Consider d/c of Novolog and Semglee and restart U500 70 units bid??   Addendum:  Spoke with patient regarding home DM management.  He states that he is used to wide fluctuations in his blood sugars.  Discussed the restart of U500 insulin today.  Patient has used a Dexcom G6 in the past but does not have receivers.  We checked his phone to see if it works with Dexcom G7 however it does not. His phone was able to download the [facility] [place] 3 App.  Requested benefits check for [employer] which indicates 0$ co-pay.  Discussed with patient and he is agreeable to use of FSL CGM.  Will request order from MD possibly more toward discharge.  Explained to patient.  We also discussed his current A1C and need to improve glycemic control.  He has f/u at [facility] in December.  However patient states he would like to get Endocrinologist closer to home if possible?  Will follow up on 10/23 regarding sensor placement.    Thanks,  Beryl Meager, RN, BC-ADM Inpatient Diabetes Coordinator Pager [phone]  (8a-5p)

## 2023-09-19 NOTE — TOC Benefit Eligibility Note (Signed)
Patient Product/process development scientist completed.    The patient is insured through [facility]. Patient has Medicare and is not eligible for a copay card, but may be able to apply for patient assistance, if available.    Ran test claim for [employer] 3 Sensor and the current 30 day co-pay is $0.00.   This test claim was processed through [facility]- copay amounts may vary at other pharmacies due to pharmacy/plan contracts, or as the patient moves through the different stages of their insurance plan.     Roland Earl, CPHT Pharmacy Technician III Certified Patient Advocate [facility] Pharm[facility] Patient Advocate Team Direct Number: [phone]  Fax: [phone]

## 2023-09-20 DIAGNOSIS — D509 Iron deficiency anemia, unspecified: Secondary | ICD-10-CM

## 2023-09-20 DIAGNOSIS — E559 Vitamin D deficiency, unspecified: Secondary | ICD-10-CM

## 2023-09-20 DIAGNOSIS — R079 Chest pain, unspecified: Secondary | ICD-10-CM | POA: Diagnosis not present

## 2023-09-20 DIAGNOSIS — E1100 Type 2 diabetes mellitus with hyperosmolarity without nonketotic hyperglycemic-hyperosmolar coma (NKHHC): Secondary | ICD-10-CM | POA: Diagnosis not present

## 2023-09-20 LAB — BASIC METABOLIC PANEL
Anion gap: 14 (ref 5–15)
BUN: 67 mg/dL — ABNORMAL HIGH (ref 8–23)
CO2: 26 mmol/L (ref 22–32)
Calcium: 8.3 mg/dL — ABNORMAL LOW (ref 8.9–10.3)
Chloride: 90 mmol/L — ABNORMAL LOW (ref 98–111)
Creatinine, Ser: 7.04 mg/dL — ABNORMAL HIGH (ref 0.61–1.24)
GFR, Estimated: 8 mL/min — ABNORMAL LOW (ref >60)
Glucose, Bld: 137 mg/dL — ABNORMAL HIGH (ref 70–99)
Potassium: 4.1 mmol/L (ref 3.5–5.1)
Sodium: 130 mmol/L — ABNORMAL LOW (ref 135–145)

## 2023-09-20 LAB — CBC
HCT: 29.6 % — ABNORMAL LOW (ref 39.0–52.0)
Hemoglobin: 9.4 g/dL — ABNORMAL LOW (ref 13.0–17.0)
MCH: 28.5 pg (ref 26.0–34.0)
MCHC: 31.8 g/dL (ref 30.0–36.0)
MCV: 89.7 fL (ref 80.0–100.0)
Platelets: 97 10*3/uL — ABNORMAL LOW (ref 150–400)
RBC: 3.3 MIL/uL — ABNORMAL LOW (ref 4.22–5.81)
RDW: 16 % — ABNORMAL HIGH (ref 11.5–15.5)
WBC: 6.5 10*3/uL (ref 4.0–10.5)
nRBC: 0 % (ref 0.0–0.2)

## 2023-09-20 LAB — GLUCOSE, CAPILLARY
Glucose-Capillary: 105 mg/dL — ABNORMAL HIGH (ref 70–99)
Glucose-Capillary: 140 mg/dL — ABNORMAL HIGH (ref 70–99)
Glucose-Capillary: 127 mg/dL — ABNORMAL HIGH (ref 70–99)
Glucose-Capillary: 131 mg/dL — ABNORMAL HIGH (ref 70–99)
Glucose-Capillary: 107 mg/dL — ABNORMAL HIGH (ref 70–99)

## 2023-09-20 LAB — PHOSPHORUS: Phosphorus: 5.9 mg/dL — ABNORMAL HIGH (ref 2.5–4.6)

## 2023-09-20 LAB — MAGNESIUM: Magnesium: 1.7 mg/dL (ref 1.7–2.4)

## 2023-09-20 MED ORDER — HYDRALAZINE HCL 25 MG PO TABS: 25.0000 mg | ORAL_TABLET | Freq: Four times a day (QID) | ORAL | Status: DC | PRN

## 2023-09-20 NOTE — Care Management Important Message (Signed)
Important Message  Patient Details  Name: Jeffrey [name] MRN: [id number] Date of Birth: [birth year]   Important Message Given:  Yes - Medicare IM     Dorena Bodo 09/20/2023, 1:29 PM

## 2023-09-20 NOTE — Progress Notes (Signed)
Patient called out stating "my blood sugar is getting low, my monitor is beeping and my blood sugar was 63 now its 61". This RN checked patients blood sugar with glucometer and patients blood sugar was 131.

## 2023-09-20 NOTE — Progress Notes (Signed)
[facility] Progress Note    Assessment/ Plan:   ESRD  -outpatient HD orders: discharged from outpatient dialysis unit in Dec 2023 for behavioral issues. Dialyzes here, presents through ER on TTS. 3hrs 15 min. RUE AVG, heparin 2000 units. EDW 129kg -HD tomorrow, back on TTS schedule. Labs and volume status stable, no urgent indications for RRT today  Chest pain -workup and management per primary service, currently chest pain free  Volume/ hypertension  -EDW 129kg. Will UF as tolerated  Anemia of Chronic Kidney Disease -Hemoglobin 9.4. s/p Aranesp 200 mcg 10/21 -Transfuse PRN for Hgb <7  Secondary Hyperparathyroidism/Hyperphosphatemia -resume home binders -renal diet -monitor PO4  Vascular access -LUE AVG has been used without issues recently. Appreciate VVS's assistance--TDC removed 09/19/23  DM2 with hyperglycemia -per primary service  Thrombocytopenia -per primary service  Subjective:   Patient seen and examined bedside.  No complaints. Had some SOB+dizziness yesterday when his nasal cannula fell off. Improved once O2 back on. He reports that he can do dialysis tomorrow as opposed to today [facility] removed yesterday Denies any CP, SOB, swelling.   Objective:   BP (!) 188/95 (BP Location: Right Leg)   Pulse 74   Temp 97.8 F (36.6 C) (Oral)   Resp 19   Ht 5\' 5"  (1.651 m)   Wt 128.5 kg   SpO2 100%   BMI 47.14 kg/m   Intake/Output Summary (Last 24 hours) at 09/20/2023 0804 Last data filed at 09/20/2023 0700 Gross per 24 hour  Intake 480 ml  Output 900 ml  Net -420 ml   Weight change:   Physical Exam: Gen: NAD, sitting in recliner CVS: RRR Resp: Normal work of breathing, unlabored, cta b/l Abd: Obese Ext: Left AKA, no sig edema b/l Les Neuro: awake Dialysis access: LUE AVG +b/t  Imaging: No results found.  Labs: BMET Recent Labs  Lab 09/15/23 2146 09/17/23 0227 09/18/23 0230 09/18/23 0959 09/19/23 0943 09/20/23 0228  NA 132*  133* 128* 126*  --  130*  K 4.0 3.8 4.5 4.3  --  4.1  CL 88* 90* 87* 86*  --  90*  CO2 26 28 28 24   --  26  GLUCOSE 318* 374* 404* 451*  --  137*  BUN 91* 67* 78* 83*  --  67*  CREATININE 5.64* 5.66* 6.86* 7.24*  --  7.04*  CALCIUM 8.1* 8.1* 8.1* 7.9*  --  8.3*  PHOS  --  5.6* 6.6* 6.5* 5.4* 5.9*   CBC Recent Labs  Lab 09/15/23 2146 09/17/23 0227 09/18/23 0230 09/18/23 0959 09/19/23 0943 09/20/23 0228  WBC 7.9   < > 6.2 5.9 5.8 6.5  NEUTROABS 6.3  --   --   --   --   --   HGB 8.1*   < > 8.2* 8.7* 8.5* 9.4*  HCT 24.4*   < > 26.2* 27.3* 27.8* 29.6*  MCV 85.0   < > 87.3 89.8 90.0 89.7  PLT 127*   < > 119* 114* 105* 97*   < > = values in this interval not displayed.    Medications:     acetaminophen  650 mg Oral Q6H   amLODipine  10 mg Oral q morning   ascorbic acid  500 mg Oral Daily   aspirin EC  81 mg Oral q AM   atorvastatin  40 mg Oral q AM   calcium acetate  1,334 mg Oral TID with meals   carvedilol  12.5 mg Oral BID WC  Chlorhexidine Gluconate Cloth  6 each Topical Q0600   cholecalciferol  2,000 Units Oral Daily   clopidogrel  75 mg Oral Daily   vitamin B-12  500 mcg Oral Daily   folic acid  1 mg Oral Daily   heparin injection (subcutaneous)  5,000 Units Subcutaneous Q8H   insulin aspart  0-20 Units Subcutaneous TID WC   insulin regular human CONCENTRATED  70 Units Subcutaneous BID WC   iron polysaccharides  150 mg Oral Daily   mupirocin ointment  1 Application Nasal BID   nortriptyline  20 mg Oral QHS   pantoprazole  40 mg Oral QAC breakfast   pregabalin  75 mg Oral QHS   sertraline  100 mg Oral q AM   tamsulosin  0.4 mg Oral Daily   torsemide  120 mg Oral BID   traZODone  150 mg Oral QHS   Vitamin D (Ergocalciferol)  50,000 Units Oral Q7 days      Anthony Sar, MD [facility] 09/20/2023, 8:04 AM

## 2023-09-20 NOTE — Progress Notes (Signed)
[facility]  PROGRESS NOTE  TRU ROVER VPX:[id number] DOB: February 22, 1961 DOA: 09/15/2023 PCP: Knox Royalty, MD   Brief HPI:    62 y.o. male with medical history significant of end-stage renal disease on hemodialysis on Tuesday, Thursdays and Saturdays, obstructive sleep apnea, COPD, chronic hypoxic respiratory failure on 3 L/min of oxygen at home diabetes mellitus type 2 on insulin, peripheral arterial disease status post left above-knee amputation and history of congestive heart failure.  Patient presented to the emergency room last night with complaints of upper extremity cramps radiating through his back.  Patient has periodic spasms but symptoms from last night was the worst he had ever experienced.  Patient apparently had missed his last session of hemodialysis on Thursday.  He also complained of chest tightness and shortness of breath which is chronic.  He denied any associated nausea, vomiting or palpitations.  In the ED, CT scan of the chest done ruled out pulm emboli or aortic dissection.  EKG was also done and essentially unrevealing for any evidence of acute ischemia.  He denies any prior history of coronary disease.  Last echo from July 2024 showed preserved left ventricular ejection fraction of 55 to 60% with no obvious wall motion abnormalities.  Mild aortic sclerosis however were noted.     Assessment/Plan:   Chest pain -Resolved -Appears atypical, likely musculoskeletal -Cardiac enzymes unremarkable, EKG unrevealing -Last echo showed preserved EF with no wall motion abnormalities -Can follow-up with cardiology as outpatient -Patient takes aspirin and Plavix along with atorvastatin at home  ESRD -Frequent ER visits for hemodialysis -Missed last dialysis session due to social issues -He was emergently dialyzed on presentation in the ED -Nephrology following  Diabetes mellitus type 2 -Hemoglobin A1c 11.1 -Found to be in hyperosmolar hyperglycemic state; started on IV  insulin infusion which was transitioned to subcutaneous basal insulin along with sliding scale -Diabetes coordinator consulted -Currently on Humulin R 70 units twice daily along with NovoLog sliding scale  Peripheral arterial disease -Complicated by left above-knee amputation -Patient is currently on aspirin and Plavix  COPD with chronic respiratory failure -Currently on 3 L/min of oxygen -Continue as needed bronchodilators  Hypertension -Blood pressure has been elevated -Continue amlodipine, carvedilol, torsemide -Start hydralazine 25 mg p.o. every 6 hours as needed for BP greater than 160/100  OSA -Uses CPAP at bedtime at home  Anemia of chronic disease -Secondary to ESRD -Continue darbepoetin as per nephrology  Iron deficiency anemia -Transferrin saturation 15% -Started on iron supplementation with vitamin C -Follow with PCP to check serum iron as outpatient in 3 to 6 months  Folic acid deficiency -Continue folic acid supplementation  Vitamin D deficiency -Started vitamin D 50,000 units p.o. weekly -Recheck vitamin D level at PCP office in 3 months  Vitamin B12 level 390, goal >400 -started vitamin B12 500 mcg p.o. daily to prevent from deficiency.   Hyponatremia -Sodium is 130 -Patient is currently on hemodialysis  Medications     acetaminophen  650 mg Oral Q6H   amLODipine  10 mg Oral q morning   ascorbic acid  500 mg Oral Daily   aspirin EC  81 mg Oral q AM   atorvastatin  40 mg Oral q AM   calcium acetate  1,334 mg Oral TID with meals   carvedilol  12.5 mg Oral BID WC   Chlorhexidine Gluconate Cloth  6 each Topical Q0600   cholecalciferol  2,000 Units Oral Daily   clopidogrel  75 mg Oral Daily   vitamin  B-12  500 mcg Oral Daily   folic acid  1 mg Oral Daily   heparin injection (subcutaneous)  5,000 Units Subcutaneous Q8H   insulin aspart  0-20 Units Subcutaneous TID WC   insulin regular human CONCENTRATED  70 Units Subcutaneous BID WC   iron  polysaccharides  150 mg Oral Daily   mupirocin ointment  1 Application Nasal BID   nortriptyline  20 mg Oral QHS   pantoprazole  40 mg Oral QAC breakfast   pregabalin  75 mg Oral QHS   sertraline  100 mg Oral q AM   tamsulosin  0.4 mg Oral Daily   torsemide  120 mg Oral BID   traZODone  150 mg Oral QHS   Vitamin D (Ergocalciferol)  50,000 Units Oral Q7 days     Data Reviewed:   CBG:  Recent Labs  Lab 09/19/23 1037 09/19/23 1105 09/19/23 1616 09/19/23 2025 09/20/23 0631  GLUCAP 309* 350* 241* 169* 105*    SpO2: 100 % O2 Flow Rate (L/min): 3 L/min    Vitals:   09/19/23 2021 09/20/23 0026 09/20/23 0400 09/20/23 0716  BP: (!) 181/91 (!) 154/93 (!) 169/94 (!) 188/95  Pulse: 82  74   Resp: 12  (!) 9 19  Temp: 98 F (36.7 C) 97.9 F (36.6 C) 97.7 F (36.5 C) 97.8 F (36.6 C)  TempSrc: Oral Oral Oral Oral  SpO2: 100%  98% 100%  Weight:      Height:          Data Reviewed:  Basic Metabolic Panel: Recent Labs  Lab 09/15/23 2146 09/17/23 0227 09/18/23 0230 09/18/23 0959 09/19/23 0943 09/20/23 0228  NA 132* 133* 128* 126*  --  130*  K 4.0 3.8 4.5 4.3  --  4.1  CL 88* 90* 87* 86*  --  90*  CO2 26 28 28 24   --  26  GLUCOSE 318* 374* 404* 451*  --  137*  BUN 91* 67* 78* 83*  --  67*  CREATININE 5.64* 5.66* 6.86* 7.24*  --  7.04*  CALCIUM 8.1* 8.1* 8.1* 7.9*  --  8.3*  MG  --  1.8 1.7  --  1.7 1.7  PHOS  --  5.6* 6.6* 6.5* 5.4* 5.9*    CBC: Recent Labs  Lab 09/15/23 2146 09/17/23 0227 09/18/23 0230 09/18/23 0959 09/19/23 0943 09/20/23 0228  WBC 7.9 6.4 6.2 5.9 5.8 6.5  NEUTROABS 6.3  --   --   --   --   --   HGB 8.1* 8.6* 8.2* 8.7* 8.5* 9.4*  HCT 24.4* 27.0* 26.2* 27.3* 27.8* 29.6*  MCV 85.0 86.5 87.3 89.8 90.0 89.7  PLT 127* 144* 119* 114* 105* 97*    LFT Recent Labs  Lab 09/15/23 2146 09/18/23 0959  AST 12*  --   ALT 10  --   ALKPHOS 87  --   BILITOT 0.3  --   PROT 7.1  --   ALBUMIN 3.6 3.4*     Antibiotics: Anti-infectives (From  admission, onward)    None        DVT prophylaxis: Heparin  Code Status: Full code  Family Communication: No family at bedside   CONSULTS nephrology   Subjective   Denies shortness of breath   Objective    Physical Examination:   General-appears in no acute distress Heart-S1-S2, regular, no murmur auscultated Lungs-clear to auscultation bilaterally, no wheezing or crackles auscultated Abdomen-soft, nontender, no organomegaly Extremities-no edema in the lower extremities Neuro-alert, oriented  x3, no focal deficit noted   Status is: Inpatient:          Meredeth Ide   [facility] If 7PM-7AM, please contact night-coverage at [email or web address], Office  [phone]   09/20/2023, 8:46 AM  LOS: 3 days

## 2023-09-20 NOTE — Inpatient Diabetes Management (Signed)
Inpatient Diabetes Program Recommendations  AACE/ADA: New Consensus Statement on Inpatient Glycemic Control (2015)  Target Ranges:  Prepandial:   less than 140 mg/dL      Peak postprandial:   less than 180 mg/dL (1-2 hours)      Critically ill patients:  140 - 180 mg/dL   Lab Results  Component Value Date   GLUCAP 140 (H) 09/20/2023   HGBA1C 11.1 (H) 09/17/2023    Review of Glycemic Control  Latest Reference Range & Units 09/19/23 11:05 09/19/23 16:16 09/19/23 20:25 09/20/23 06:31 09/20/23 10:40  Glucose-Capillary 70 - 99 mg/dL 202 (H) 542 (H) 706 (H) 105 (H) 140 (H)  Diabetes history: DM 2 Outpatient Diabetes medications:  U500 insulin- 70-125 units bid If CBG<100 mg/dL, take 70 units If CBG 237-628- take 125 units If >350 mg/dL- take 315 units    Current orders for Inpatient glycemic control:  U500 insulin 70 units bid Novolog 0-20 units tid with meals and HS  Inpatient Diabetes Program Recommendations:    Order received from Dr. Sharl Ma to place FSL 3.  Patient already had app set-up on her phone.  MD ordered application of Freestyle CGM at discharge for patient. Education done regarding application and changing CGM sensor (alternate every 14 days on back of arms), 1 hour warm-up, use of glucometer when alert displays, how to scan CGM for glucose reading and information for PCP. Patient has also been given educational packet regarding use CGM sensor including the 1-800 toll free number for any questions, problems or needs related to the [facility] sensors or reader.    Sensor applied by patient to (R ) Arm at 1445.  Explained that glucose readings will not be available until 1 hour after application. Reviewed use of CGM including how to scan, changing Sensor, Vitamin C warning, arrows with glucose readings, and Freestyle app.    Patient appreciative of CGM.  MD please order FSL 3 at discharge (order [phone]).   Thanks,  Beryl Meager, RN, BC-ADM Inpatient Diabetes  Coordinator Pager [phone]  (8a-5p)

## 2023-09-21 DIAGNOSIS — N186 End stage renal disease: Secondary | ICD-10-CM | POA: Diagnosis not present

## 2023-09-21 DIAGNOSIS — E1100 Type 2 diabetes mellitus with hyperosmolarity without nonketotic hyperglycemic-hyperosmolar coma (NKHHC): Secondary | ICD-10-CM | POA: Diagnosis not present

## 2023-09-21 DIAGNOSIS — R079 Chest pain, unspecified: Secondary | ICD-10-CM | POA: Diagnosis not present

## 2023-09-21 LAB — CBC
HCT: 28.3 % — ABNORMAL LOW (ref 39.0–52.0)
Hemoglobin: 9 g/dL — ABNORMAL LOW (ref 13.0–17.0)
MCH: 28.4 pg (ref 26.0–34.0)
MCHC: 31.8 g/dL (ref 30.0–36.0)
MCV: 89.3 fL (ref 80.0–100.0)
Platelets: 105 10*3/uL — ABNORMAL LOW (ref 150–400)
RBC: 3.17 MIL/uL — ABNORMAL LOW (ref 4.22–5.81)
RDW: 16 % — ABNORMAL HIGH (ref 11.5–15.5)
WBC: 5.8 10*3/uL (ref 4.0–10.5)
nRBC: 0 % (ref 0.0–0.2)

## 2023-09-21 LAB — BASIC METABOLIC PANEL
Anion gap: 14 (ref 5–15)
BUN: 77 mg/dL — ABNORMAL HIGH (ref 8–23)
CO2: 28 mmol/L (ref 22–32)
Calcium: 8.2 mg/dL — ABNORMAL LOW (ref 8.9–10.3)
Chloride: 91 mmol/L — ABNORMAL LOW (ref 98–111)
Creatinine, Ser: 7.84 mg/dL — ABNORMAL HIGH (ref 0.61–1.24)
GFR, Estimated: 7 mL/min — ABNORMAL LOW (ref >60)
Glucose, Bld: 60 mg/dL — ABNORMAL LOW (ref 70–99)
Potassium: 3.8 mmol/L (ref 3.5–5.1)
Sodium: 133 mmol/L — ABNORMAL LOW (ref 135–145)

## 2023-09-21 LAB — GLUCOSE, CAPILLARY
Glucose-Capillary: 67 mg/dL — ABNORMAL LOW (ref 70–99)
Glucose-Capillary: 81 mg/dL (ref 70–99)
Glucose-Capillary: 175 mg/dL — ABNORMAL HIGH (ref 70–99)
Glucose-Capillary: 302 mg/dL — ABNORMAL HIGH (ref 70–99)
Glucose-Capillary: 341 mg/dL — ABNORMAL HIGH (ref 70–99)

## 2023-09-21 MED ORDER — INSULIN REGULAR HUMAN (CONC) 500 UNIT/ML ~~LOC~~ SOPN
65.0000 [IU] | PEN_INJECTOR | Freq: Two times a day (BID) | SUBCUTANEOUS | Status: DC
  Administered 2023-09-21 – 2023-09-22 (×3): 65 [IU] via SUBCUTANEOUS
  Filled 2023-09-21: qty 3

## 2023-09-21 NOTE — Progress Notes (Signed)
[facility]  PROGRESS NOTE  Jeffrey [name] KVQ:[id number] DOB: 1961-01-11 DOA: 09/15/2023 PCP: Knox Royalty, MD   Brief HPI:    62 y.o. male with medical history significant of end-stage renal disease on hemodialysis on Tuesday, Thursdays and Saturdays, obstructive sleep apnea, COPD, chronic hypoxic respiratory failure on 3 L/min of oxygen at home diabetes mellitus type 2 on insulin, peripheral arterial disease status post left above-knee amputation and history of congestive heart failure.  Patient presented to the emergency room last night with complaints of upper extremity cramps radiating through his back.  Patient has periodic spasms but symptoms from last night was the worst he had ever experienced.  Patient apparently had missed his last session of hemodialysis on Thursday.  He also complained of chest tightness and shortness of breath which is chronic.  He denied any associated nausea, vomiting or palpitations.  In the ED, CT scan of the chest done ruled out pulm emboli or aortic dissection.  EKG was also done and essentially unrevealing for any evidence of acute ischemia.  He denies any prior history of coronary disease.  Last echo from July 2024 showed preserved left ventricular ejection fraction of 55 to 60% with no obvious wall motion abnormalities.  Mild aortic sclerosis however were noted.     Assessment/Plan:   Chest pain -Resolved -Appears atypical, likely musculoskeletal -Cardiac enzymes unremarkable, EKG unrevealing -Last echo showed preserved EF with no wall motion abnormalities -Can follow-up with cardiology as outpatient -Patient takes aspirin and Plavix along with atorvastatin at home  ESRD -Frequent ER visits for hemodialysis -Missed last dialysis session due to social issues -He was emergently dialyzed on presentation in the ED -Nephrology following  Diabetes mellitus type 2 -Hemoglobin A1c 11.1 -Found to be in hyperosmolar hyperglycemic state; started on IV  insulin infusion which was transitioned to subcutaneous basal insulin along with sliding scale -Diabetes coordinator consulted -Currently on Humulin R 70 units twice daily along with NovoLog sliding scale -Blood glucose has been on lower side, will change Humulin R to 65 units subcu twice daily  Peripheral arterial disease -Complicated by left above-knee amputation -Patient is currently on aspirin and Plavix  COPD with chronic respiratory failure -Currently on 3 L/min of oxygen -Continue as needed bronchodilators  Hypertension -Blood pressure has been elevated -Continue amlodipine, carvedilol, torsemide -Start hydralazine 25 mg p.o. every 6 hours as needed for BP greater than 160/100  OSA -Uses CPAP at bedtime at home  Anemia of chronic disease -Secondary to ESRD -Continue darbepoetin as per nephrology  Iron deficiency anemia -Transferrin saturation 15% -Started on iron supplementation with vitamin C -Follow with PCP to check serum iron as outpatient in 3 to 6 months  Folic acid deficiency -Continue folic acid supplementation  Vitamin D deficiency -Started vitamin D 50,000 units p.o. weekly -Recheck vitamin D level at PCP office in 3 months  Vitamin B12 level 390, goal >400 -started vitamin B12 500 mcg p.o. daily to prevent from deficiency.   Hyponatremia -Sodium is 130 -Patient is currently on hemodialysis  Medications     acetaminophen  650 mg Oral Q6H   amLODipine  10 mg Oral q morning   ascorbic acid  500 mg Oral Daily   aspirin EC  81 mg Oral q AM   atorvastatin  40 mg Oral q AM   calcium acetate  1,334 mg Oral TID with meals   carvedilol  12.5 mg Oral BID WC   Chlorhexidine Gluconate Cloth  6 each Topical Q0600  clopidogrel  75 mg Oral Daily   vitamin B-12  500 mcg Oral Daily   folic acid  1 mg Oral Daily   heparin injection (subcutaneous)  5,000 Units Subcutaneous Q8H   insulin aspart  0-20 Units Subcutaneous TID WC   insulin regular human  CONCENTRATED  70 Units Subcutaneous BID WC   iron polysaccharides  150 mg Oral Daily   mupirocin ointment  1 Application Nasal BID   nortriptyline  20 mg Oral QHS   pantoprazole  40 mg Oral QAC breakfast   pregabalin  75 mg Oral QHS   sertraline  100 mg Oral q AM   tamsulosin  0.4 mg Oral Daily   torsemide  120 mg Oral BID   traZODone  150 mg Oral QHS   Vitamin D (Ergocalciferol)  50,000 Units Oral Q7 days     Data Reviewed:   CBG:  Recent Labs  Lab 09/20/23 1618 09/20/23 1930 09/20/23 2111 09/21/23 0608 09/21/23 0641  GLUCAP 127* 131* 107* 67* 81    SpO2: 100 % O2 Flow Rate (L/min): 3 L/min    Vitals:   09/21/23 0830 09/21/23 0900 09/21/23 0930 09/21/23 1000  BP: (!) 156/78 (!) 145/82 (!) 159/131 (!) 150/79  Pulse: 83 84 83 84  Resp: 11 17 13  (!) 9  Temp:      TempSrc:      SpO2: 100% 100% 100% 100%  Weight:      Height:          Data Reviewed:  Basic Metabolic Panel: Recent Labs  Lab 09/17/23 0227 09/18/23 0230 09/18/23 0959 09/19/23 0943 09/20/23 0228 09/21/23 0236  NA 133* 128* 126*  --  130* 133*  K 3.8 4.5 4.3  --  4.1 3.8  CL 90* 87* 86*  --  90* 91*  CO2 28 28 24   --  26 28  GLUCOSE 374* 404* 451*  --  137* 60*  BUN 67* 78* 83*  --  67* 77*  CREATININE 5.66* 6.86* 7.24*  --  7.04* 7.84*  CALCIUM 8.1* 8.1* 7.9*  --  8.3* 8.2*  MG 1.8 1.7  --  1.7 1.7  --   PHOS 5.6* 6.6* 6.5* 5.4* 5.9*  --     CBC: Recent Labs  Lab 09/15/23 2146 09/17/23 0227 09/18/23 0230 09/18/23 0959 09/19/23 0943 09/20/23 0228 09/21/23 0236  WBC 7.9   < > 6.2 5.9 5.8 6.5 5.8  NEUTROABS 6.3  --   --   --   --   --   --   HGB 8.1*   < > 8.2* 8.7* 8.5* 9.4* 9.0*  HCT 24.4*   < > 26.2* 27.3* 27.8* 29.6* 28.3*  MCV 85.0   < > 87.3 89.8 90.0 89.7 89.3  PLT 127*   < > 119* 114* 105* 97* 105*   < > = values in this interval not displayed.    LFT Recent Labs  Lab 09/15/23 2146 09/18/23 0959  AST 12*  --   ALT 10  --   ALKPHOS 87  --   BILITOT 0.3  --    PROT 7.1  --   ALBUMIN 3.6 3.4*     Antibiotics: Anti-infectives (From admission, onward)    None        DVT prophylaxis: Heparin  Code Status: Full code  Family Communication: No family at bedside   CONSULTS nephrology   Subjective   Denies shortness of breath.    Objective    Physical  Examination:   General-appears in no acute distress Heart-S1-S2, regular, no murmur auscultated Lungs-clear to auscultation bilaterally, no wheezing or crackles auscultated Abdomen-soft, nontender, no organomegaly Extremities-no edema in the lower extremities Neuro-alert, oriented x3, no focal deficit noted   Status is: Inpatient:          Meredeth Ide   [facility] If 7PM-7AM, please contact night-coverage at [email or web address], Office  [phone]   09/21/2023, 10:29 AM  LOS: 4 days

## 2023-09-21 NOTE — Plan of Care (Signed)
  Problem: Education: Goal: Knowledge of General Education information will improve Description: Including pain rating scale, medication(s)/side effects and non-pharmacologic comfort measures Outcome: Progressing   Problem: Health Behavior/Discharge Planning: Goal: Ability to manage health-related needs will improve Outcome: Progressing   Problem: Clinical Measurements: Goal: Ability to maintain clinical measurements within normal limits will improve Outcome: Progressing Goal: Will remain free from infection Outcome: Progressing Goal: Diagnostic test results will improve Outcome: Progressing Goal: Respiratory complications will improve Outcome: Progressing Goal: Cardiovascular complication will be avoided Outcome: Progressing   Problem: Activity: Goal: Risk for activity intolerance will decrease Outcome: Progressing   Problem: Nutrition: Goal: Adequate nutrition will be maintained Outcome: Progressing   Problem: Coping: Goal: Level of anxiety will decrease Outcome: Progressing   Problem: Elimination: Goal: Will not experience complications related to bowel motility Outcome: Progressing Goal: Will not experience complications related to urinary retention Outcome: Progressing   Problem: Pain Managment: Goal: General experience of comfort will improve Outcome: Progressing   Problem: Safety: Goal: Ability to remain free from injury will improve Outcome: Progressing   Problem: Skin Integrity: Goal: Risk for impaired skin integrity will decrease Outcome: Progressing   Problem: Education: Goal: Ability to describe self-care measures that may prevent or decrease complications (Diabetes Survival Skills Education) will improve Outcome: Progressing Goal: Individualized Educational Video(s) Outcome: Progressing   Problem: Coping: Goal: Ability to adjust to condition or change in health will improve Outcome: Progressing   Problem: Fluid Volume: Goal: Ability to  maintain a balanced intake and output will improve Outcome: Progressing   Problem: Health Behavior/Discharge Planning: Goal: Ability to identify and utilize available resources and services will improve Outcome: Progressing Goal: Ability to manage health-related needs will improve Outcome: Progressing   Problem: Metabolic: Goal: Ability to maintain appropriate glucose levels will improve Outcome: Progressing   Problem: Nutritional: Goal: Maintenance of adequate nutrition will improve Outcome: Progressing Goal: Progress toward achieving an optimal weight will improve Outcome: Progressing   Problem: Skin Integrity: Goal: Risk for impaired skin integrity will decrease Outcome: Progressing   Problem: Tissue Perfusion: Goal: Adequacy of tissue perfusion will improve Outcome: Progressing   Problem: Education: Goal: Ability to describe self-care measures that may prevent or decrease complications (Diabetes Survival Skills Education) will improve Outcome: Progressing Goal: Individualized Educational Video(s) Outcome: Progressing   Problem: Cardiac: Goal: Ability to maintain an adequate cardiac output will improve Outcome: Progressing   Problem: Health Behavior/Discharge Planning: Goal: Ability to identify and utilize available resources and services will improve Outcome: Progressing Goal: Ability to manage health-related needs will improve Outcome: Progressing   Problem: Fluid Volume: Goal: Ability to achieve a balanced intake and output will improve Outcome: Progressing   Problem: Metabolic: Goal: Ability to maintain appropriate glucose levels will improve Outcome: Progressing   Problem: Nutritional: Goal: Maintenance of adequate nutrition will improve Outcome: Progressing Goal: Maintenance of adequate weight for body size and type will improve Outcome: Progressing   Problem: Respiratory: Goal: Will regain and/or maintain adequate ventilation Outcome: Progressing    Problem: Urinary Elimination: Goal: Ability to achieve and maintain adequate renal perfusion and functioning will improve Outcome: Progressing   

## 2023-09-21 NOTE — Progress Notes (Signed)
Hypoglycemic Event  CBG: 67  Treatment: 4 oz juice/soda and graham cracker/peanut butter  Symptoms: None  Follow-up CBG: Time:0640 CBG Result:87  Possible Reasons for Event: Medication regimen: glucose regimen has been adjusted  Comments/MD notified:N/A    Jeffrey [name] D Minas Bonser

## 2023-09-21 NOTE — Plan of Care (Signed)
Problem: Clinical Measurements: Goal: Will remain free from infection Outcome: Progressing   Problem: Nutrition: Goal: Adequate nutrition will be maintained Outcome: Progressing   Problem: Coping: Goal: Level of anxiety will decrease Outcome: Progressing   Problem: Safety: Goal: Ability to remain free from injury will improve Outcome: Progressing   Problem: Skin Integrity: Goal: Risk for impaired skin integrity will decrease Outcome: Progressing

## 2023-09-21 NOTE — Progress Notes (Signed)
   09/21/23 1131  Vitals  Temp 98.1 F (36.7 C)  Pulse Rate 88  Resp 12  BP (!) 159/81  SpO2 100 %  O2 Device Nasal Cannula  Weight 127.3 kg  Type of Weight Pre-Dialysis  Oxygen Therapy  O2 Flow Rate (L/min) 4 L/min  Patient Activity (if Appropriate) In bed  Post Treatment  Dialyzer Clearance Clear  Hemodialysis Intake (mL) 0 mL  Liters Processed 64  Fluid Removed (mL) 2000 mL  Tolerated HD Treatment Yes  AVG/AVF Arterial Site Held (minutes) 10 minutes  AVG/AVF Venous Site Held (minutes) 10 minutes   Received patient in bed to unit.  Alert and oriented.  Informed consent signed and in chart.   TX duration:3.25  Patient tolerated well.  Transported back to the room  Alert, without acute distress.  Hand-off given to patient's nurse.   Access used: LAVG Access issues: none  Total UF removed: 2L Medication(s) given: oxy IR    [name]

## 2023-09-21 NOTE — Procedures (Signed)
I was present at this dialysis session. I have reviewed the session itself and made appropriate changes.   Filed Weights   09/18/23 1527 09/19/23 0358 09/21/23 0754  Weight: 128.5 kg 128.5 kg 129.5 kg    Recent Labs  Lab 09/20/23 0228 09/21/23 0236  NA 130* 133*  K 4.1 3.8  CL 90* 91*  CO2 26 28  GLUCOSE 137* 60*  BUN 67* 77*  CREATININE 7.04* 7.84*  CALCIUM 8.3* 8.2*  PHOS 5.9*  --     Recent Labs  Lab 09/15/23 2146 09/17/23 0227 09/19/23 0943 09/20/23 0228 09/21/23 0236  WBC 7.9   < > 5.8 6.5 5.8  NEUTROABS 6.3  --   --   --   --   HGB 8.1*   < > 8.5* 9.4* 9.0*  HCT 24.4*   < > 27.8* 29.6* 28.3*  MCV 85.0   < > 90.0 89.7 89.3  PLT 127*   < > 105* 97* 105*   < > = values in this interval not displayed.    Scheduled Meds:  acetaminophen  650 mg Oral Q6H   amLODipine  10 mg Oral q morning   ascorbic acid  500 mg Oral Daily   aspirin EC  81 mg Oral q AM   atorvastatin  40 mg Oral q AM   calcium acetate  1,334 mg Oral TID with meals   carvedilol  12.5 mg Oral BID WC   Chlorhexidine Gluconate Cloth  6 each Topical Q0600   clopidogrel  75 mg Oral Daily   vitamin B-12  500 mcg Oral Daily   folic acid  1 mg Oral Daily   heparin injection (subcutaneous)  5,000 Units Subcutaneous Q8H   insulin aspart  0-20 Units Subcutaneous TID WC   insulin regular human CONCENTRATED  70 Units Subcutaneous BID WC   iron polysaccharides  150 mg Oral Daily   mupirocin ointment  1 Application Nasal BID   nortriptyline  20 mg Oral QHS   pantoprazole  40 mg Oral QAC breakfast   pregabalin  75 mg Oral QHS   sertraline  100 mg Oral q AM   tamsulosin  0.4 mg Oral Daily   torsemide  120 mg Oral BID   traZODone  150 mg Oral QHS   Vitamin D (Ergocalciferol)  50,000 Units Oral Q7 days   Continuous Infusions: PRN Meds:.acetaminophen **OR** acetaminophen, calcium carbonate (dosed in mg elemental calcium), cyclobenzaprine, dextrose, diazepam, fentaNYL (SUBLIMAZE) injection, hydrALAZINE,  melatonin, naLOXone (NARCAN)  injection, nitroGLYCERIN, ondansetron (ZOFRAN) IV, mouth rinse, oxyCODONE   Anthony Sar, MD [facility] 09/21/2023, 9:50 AM

## 2023-09-22 DIAGNOSIS — N186 End stage renal disease: Secondary | ICD-10-CM | POA: Diagnosis not present

## 2023-09-22 DIAGNOSIS — E1100 Type 2 diabetes mellitus with hyperosmolarity without nonketotic hyperglycemic-hyperosmolar coma (NKHHC): Secondary | ICD-10-CM | POA: Diagnosis not present

## 2023-09-22 DIAGNOSIS — R079 Chest pain, unspecified: Secondary | ICD-10-CM | POA: Diagnosis not present

## 2023-09-22 LAB — CBC
HCT: 28.7 % — ABNORMAL LOW (ref 39.0–52.0)
Hemoglobin: 8.9 g/dL — ABNORMAL LOW (ref 13.0–17.0)
MCH: 28.1 pg (ref 26.0–34.0)
MCHC: 31 g/dL (ref 30.0–36.0)
MCV: 90.5 fL (ref 80.0–100.0)
Platelets: 105 10*3/uL — ABNORMAL LOW (ref 150–400)
RBC: 3.17 MIL/uL — ABNORMAL LOW (ref 4.22–5.81)
RDW: 16.2 % — ABNORMAL HIGH (ref 11.5–15.5)
WBC: 5.4 10*3/uL (ref 4.0–10.5)
nRBC: 0 % (ref 0.0–0.2)

## 2023-09-22 LAB — GLUCOSE, CAPILLARY
Glucose-Capillary: 404 mg/dL — ABNORMAL HIGH (ref 70–99)
Glucose-Capillary: 228 mg/dL — ABNORMAL HIGH (ref 70–99)
Glucose-Capillary: 156 mg/dL — ABNORMAL HIGH (ref 70–99)
Glucose-Capillary: 130 mg/dL — ABNORMAL HIGH (ref 70–99)

## 2023-09-22 LAB — BASIC METABOLIC PANEL
Anion gap: 12 (ref 5–15)
BUN: 68 mg/dL — ABNORMAL HIGH (ref 8–23)
CO2: 31 mmol/L (ref 22–32)
Calcium: 8.8 mg/dL — ABNORMAL LOW (ref 8.9–10.3)
Chloride: 88 mmol/L — ABNORMAL LOW (ref 98–111)
Creatinine, Ser: 7.05 mg/dL — ABNORMAL HIGH (ref 0.61–1.24)
GFR, Estimated: 8 mL/min — ABNORMAL LOW (ref >60)
Glucose, Bld: 231 mg/dL — ABNORMAL HIGH (ref 70–99)
Potassium: 4.5 mmol/L (ref 3.5–5.1)
Sodium: 131 mmol/L — ABNORMAL LOW (ref 135–145)

## 2023-09-22 NOTE — Progress Notes (Signed)
Patient can't do standing (has AKA) and doesn't want to get in bed for a bed weight at this time.

## 2023-09-22 NOTE — Inpatient Diabetes Management (Signed)
Inpatient Diabetes Program Recommendations  AACE/ADA: New Consensus Statement on Inpatient Glycemic Control (2015)  Target Ranges:  Prepandial:   less than 140 mg/dL      Peak postprandial:   less than 180 mg/dL (1-2 hours)      Critically ill patients:  140 - 180 mg/dL   Lab Results  Component Value Date   GLUCAP 228 (H) 09/22/2023   HGBA1C 11.1 (H) 09/17/2023    Review of Glycemic Control  Diabetes history: type 2 Outpatient Diabetes medications: U-500 insulin 70-125 units BID If CBG<100 mg/dl, take 70 units If CBG 284-132 mg/dl, take 440 units If >102 mg/dl, take 725 units Current orders for Inpatient glycemic control: U-500 insulin 65 units BID, Novolog 0-20 units correction scale TID  Inpatient Diabetes Program Recommendations:   Spoke to patient at the bedside. Patient was administered another Freestyle Libre 3 sensor applied to right arm by patient, removing the other one that was not working. Patient would like to start using the [place] 3 sensor at home. Phone app was used to read sensor. Waiting to have sensor warm up for 60 minutes. Will need prescription for the [facility] 3 sensor at discharge. Noted that when inserting the new sensor, there was some bleeding that was stopped fairly quickly. Will need to watch to see if the sensor has anymore bleeding associated with the site. Patient may need a new home blood glucose meter, strips, and lancets for a backup.   Discharge Recommendations: Other recommendations: Freestyle Libre 3 sensor [phone] Supply/Referral recommendations: Glucometer Test strips Lancet device Lancets   Use Adult Diabetes Insulin Treatment Post Discharge order set.  Smith Mince RN BSN CDE Diabetes Coordinator Pager: [phone]  8am-5pm

## 2023-09-22 NOTE — Plan of Care (Signed)
  Problem: Education: Goal: Knowledge of General Education information will improve Description: Including pain rating scale, medication(s)/side effects and non-pharmacologic comfort measures Outcome: Progressing   Problem: Health Behavior/Discharge Planning: Goal: Ability to manage health-related needs will improve Outcome: Progressing   Problem: Clinical Measurements: Goal: Ability to maintain clinical measurements within normal limits will improve Outcome: Progressing Goal: Will remain free from infection Outcome: Progressing Goal: Diagnostic test results will improve Outcome: Progressing Goal: Respiratory complications will improve Outcome: Progressing Goal: Cardiovascular complication will be avoided Outcome: Progressing   Problem: Activity: Goal: Risk for activity intolerance will decrease Outcome: Progressing   Problem: Nutrition: Goal: Adequate nutrition will be maintained Outcome: Progressing   Problem: Coping: Goal: Level of anxiety will decrease Outcome: Progressing   Problem: Elimination: Goal: Will not experience complications related to bowel motility Outcome: Progressing Goal: Will not experience complications related to urinary retention Outcome: Progressing   Problem: Pain Managment: Goal: General experience of comfort will improve Outcome: Progressing   Problem: Safety: Goal: Ability to remain free from injury will improve Outcome: Progressing   Problem: Skin Integrity: Goal: Risk for impaired skin integrity will decrease Outcome: Progressing   Problem: Education: Goal: Ability to describe self-care measures that may prevent or decrease complications (Diabetes Survival Skills Education) will improve Outcome: Progressing Goal: Individualized Educational Video(s) Outcome: Progressing   Problem: Coping: Goal: Ability to adjust to condition or change in health will improve Outcome: Progressing   Problem: Fluid Volume: Goal: Ability to  maintain a balanced intake and output will improve Outcome: Progressing   Problem: Health Behavior/Discharge Planning: Goal: Ability to identify and utilize available resources and services will improve Outcome: Progressing Goal: Ability to manage health-related needs will improve Outcome: Progressing   Problem: Metabolic: Goal: Ability to maintain appropriate glucose levels will improve Outcome: Progressing   Problem: Nutritional: Goal: Maintenance of adequate nutrition will improve Outcome: Progressing Goal: Progress toward achieving an optimal weight will improve Outcome: Progressing   Problem: Skin Integrity: Goal: Risk for impaired skin integrity will decrease Outcome: Progressing   Problem: Tissue Perfusion: Goal: Adequacy of tissue perfusion will improve Outcome: Progressing   Problem: Education: Goal: Ability to describe self-care measures that may prevent or decrease complications (Diabetes Survival Skills Education) will improve Outcome: Progressing Goal: Individualized Educational Video(s) Outcome: Progressing   Problem: Cardiac: Goal: Ability to maintain an adequate cardiac output will improve Outcome: Progressing   Problem: Health Behavior/Discharge Planning: Goal: Ability to identify and utilize available resources and services will improve Outcome: Progressing Goal: Ability to manage health-related needs will improve Outcome: Progressing   Problem: Fluid Volume: Goal: Ability to achieve a balanced intake and output will improve Outcome: Progressing   Problem: Metabolic: Goal: Ability to maintain appropriate glucose levels will improve Outcome: Progressing   Problem: Nutritional: Goal: Maintenance of adequate nutrition will improve Outcome: Progressing Goal: Maintenance of adequate weight for body size and type will improve Outcome: Progressing   Problem: Respiratory: Goal: Will regain and/or maintain adequate ventilation Outcome: Progressing    Problem: Urinary Elimination: Goal: Ability to achieve and maintain adequate renal perfusion and functioning will improve Outcome: Progressing   

## 2023-09-22 NOTE — Progress Notes (Signed)
[facility] Progress Note    Assessment/ Plan:   ESRD  -outpatient HD orders: discharged from outpatient dialysis unit in Dec 2023 for behavioral issues. Dialyzes here, presents through ER on TTS. 3hrs 15 min. RUE AVG, heparin 2000 units. EDW 129kg -HD back to TTS schedule, HD tomorrow  Chest pain -workup and management per primary service, currently chest pain free  Volume/ hypertension  -EDW 129kg. Will UF as tolerated  Anemia of Chronic Kidney Disease -Hemoglobin 9.0. s/p Aranesp 200 mcg 10/21 -Transfuse PRN for Hgb <7  Secondary Hyperparathyroidism/Hyperphosphatemia -resume home binders -renal diet -monitor PO4  Vascular access -LUE AVG has been used without issues recently. Appreciate VVS's assistance--TDC removed 09/19/23  DM2 with hyperglycemia -per primary service  Thrombocytopenia -per primary service  Subjective:   Patient seen and examined bedside.  No complaints. Tolerated HD yesterday with net uf 2L. Denies any chest pain, SOB   Objective:   BP (!) 157/66 (BP Location: Right Leg)   Pulse 86   Temp 97.8 F (36.6 C) (Oral)   Resp 17   Ht 5\' 5"  (1.651 m)   Wt 127.3 kg   SpO2 99%   BMI 46.70 kg/m   Intake/Output Summary (Last 24 hours) at 09/22/2023 0901 Last data filed at 09/22/2023 0000 Gross per 24 hour  Intake 480 ml  Output 2000 ml  Net -1520 ml   Weight change:   Physical Exam: Gen: NAD, sitting in recliner CVS: RRR Resp: Normal work of breathing, unlabored, cta b/l Abd: Obese Ext: Left AKA, no sig edema b/l Les Neuro: awake Dialysis access: LUE AVG +b/t  Imaging: No results found.  Labs: BMET Recent Labs  Lab 09/15/23 2146 09/17/23 0227 09/18/23 0230 09/18/23 0959 09/19/23 0943 09/20/23 0228 09/21/23 0236  NA 132* 133* 128* 126*  --  130* 133*  K 4.0 3.8 4.5 4.3  --  4.1 3.8  CL 88* 90* 87* 86*  --  90* 91*  CO2 26 28 28 24   --  26 28  GLUCOSE 318* 374* 404* 451*  --  137* 60*  BUN 91* 67* 78* 83*   --  67* 77*  CREATININE 5.64* 5.66* 6.86* 7.24*  --  7.04* 7.84*  CALCIUM 8.1* 8.1* 8.1* 7.9*  --  8.3* 8.2*  PHOS  --  5.6* 6.6* 6.5* 5.4* 5.9*  --    CBC Recent Labs  Lab 09/15/23 2146 09/17/23 0227 09/18/23 0959 09/19/23 0943 09/20/23 0228 09/21/23 0236  WBC 7.9   < > 5.9 5.8 6.5 5.8  NEUTROABS 6.3  --   --   --   --   --   HGB 8.1*   < > 8.7* 8.5* 9.4* 9.0*  HCT 24.4*   < > 27.3* 27.8* 29.6* 28.3*  MCV 85.0   < > 89.8 90.0 89.7 89.3  PLT 127*   < > 114* 105* 97* 105*   < > = values in this interval not displayed.    Medications:     acetaminophen  650 mg Oral Q6H   amLODipine  10 mg Oral q morning   ascorbic acid  500 mg Oral Daily   aspirin EC  81 mg Oral q AM   atorvastatin  40 mg Oral q AM   calcium acetate  1,334 mg Oral TID with meals   carvedilol  12.5 mg Oral BID WC   Chlorhexidine Gluconate Cloth  6 each Topical Q0600   clopidogrel  75 mg Oral Daily   vitamin B-12  500 mcg Oral Daily   folic acid  1 mg Oral Daily   heparin injection (subcutaneous)  5,000 Units Subcutaneous Q8H   insulin aspart  0-20 Units Subcutaneous TID WC   insulin regular human CONCENTRATED  65 Units Subcutaneous BID WC   iron polysaccharides  150 mg Oral Daily   nortriptyline  20 mg Oral QHS   pantoprazole  40 mg Oral QAC breakfast   pregabalin  75 mg Oral QHS   sertraline  100 mg Oral q AM   tamsulosin  0.4 mg Oral Daily   torsemide  120 mg Oral BID   traZODone  150 mg Oral QHS   Vitamin D (Ergocalciferol)  50,000 Units Oral Q7 days      Anthony Sar, MD [facility] 09/22/2023, 9:01 AM

## 2023-09-22 NOTE — Progress Notes (Signed)
[facility]  PROGRESS NOTE  Jeffrey [name] ZOX:[id number] DOB: 1961-10-24 DOA: 09/15/2023 PCP: Knox Royalty, MD   Brief HPI:    62 y.o. male with medical history significant of end-stage renal disease on hemodialysis on Tuesday, Thursdays and Saturdays, obstructive sleep apnea, COPD, chronic hypoxic respiratory failure on 3 L/min of oxygen at home diabetes mellitus type 2 on insulin, peripheral arterial disease status post left above-knee amputation and history of congestive heart failure.  Patient presented to the emergency room last night with complaints of upper extremity cramps radiating through his back.  Patient has periodic spasms but symptoms from last night was the worst he had ever experienced.  Patient apparently had missed his last session of hemodialysis on Thursday.  He also complained of chest tightness and shortness of breath which is chronic.  He denied any associated nausea, vomiting or palpitations.  In the ED, CT scan of the chest done ruled out pulm emboli or aortic dissection.  EKG was also done and essentially unrevealing for any evidence of acute ischemia.  He denies any prior history of coronary disease.  Last echo from July 2024 showed preserved left ventricular ejection fraction of 55 to 60% with no obvious wall motion abnormalities.  Mild aortic sclerosis however were noted.     Assessment/Plan:   Chest pain -Resolved -Appears atypical, likely musculoskeletal -Cardiac enzymes unremarkable, EKG unrevealing -Last echo showed preserved EF with no wall motion abnormalities -Can follow-up with cardiology as outpatient -Patient takes aspirin and Plavix along with atorvastatin at home  ESRD -Frequent ER visits for hemodialysis -Missed last dialysis session due to social issues -He was emergently dialyzed on presentation in the ED -Nephrology following  Diabetes mellitus type 2 -Hemoglobin A1c 11.1 -Found to be in hyperosmolar hyperglycemic state; started on IV  insulin infusion which was transitioned to subcutaneous basal insulin along with sliding scale -Diabetes coordinator consulted -Currently on Humulin R 70 units twice daily along with NovoLog sliding scale -Blood glucose has been on lower side, changed Humulin R to 65 units subcu twice daily  Peripheral arterial disease -Complicated by left above-knee amputation -Patient is currently on aspirin and Plavix  COPD with chronic respiratory failure -Currently on 3 L/min of oxygen -Continue as needed bronchodilators  Hypertension -Blood pressure has been elevated -Continue amlodipine, carvedilol, torsemide -Started hydralazine 25 mg p.o. every 6 hours as needed for BP greater than 160/100  OSA -Uses CPAP at bedtime at home  Anemia of chronic disease -Secondary to ESRD -Continue darbepoetin as per nephrology  Iron deficiency anemia -Transferrin saturation 15% -Started on iron supplementation with vitamin C -Follow with PCP to check serum iron as outpatient in 3 to 6 months  Folic acid deficiency -Continue folic acid supplementation  Vitamin D deficiency -Started vitamin D 50,000 units p.o. weekly -Recheck vitamin D level at PCP office in 3 months  Vitamin B12 level 390, goal >400 -started vitamin B12 500 mcg p.o. daily to prevent from deficiency.   Hyponatremia -Sodium is 130 -Patient is currently on hemodialysis  Medications     acetaminophen  650 mg Oral Q6H   amLODipine  10 mg Oral q morning   ascorbic acid  500 mg Oral Daily   aspirin EC  81 mg Oral q AM   atorvastatin  40 mg Oral q AM   calcium acetate  1,334 mg Oral TID with meals   carvedilol  12.5 mg Oral BID WC   Chlorhexidine Gluconate Cloth  6 each Topical Q0600   clopidogrel  75 mg Oral Daily   vitamin B-12  500 mcg Oral Daily   folic acid  1 mg Oral Daily   heparin injection (subcutaneous)  5,000 Units Subcutaneous Q8H   insulin aspart  0-20 Units Subcutaneous TID WC   insulin regular human  CONCENTRATED  65 Units Subcutaneous BID WC   iron polysaccharides  150 mg Oral Daily   nortriptyline  20 mg Oral QHS   pantoprazole  40 mg Oral QAC breakfast   pregabalin  75 mg Oral QHS   sertraline  100 mg Oral q AM   tamsulosin  0.4 mg Oral Daily   torsemide  120 mg Oral BID   traZODone  150 mg Oral QHS   Vitamin D (Ergocalciferol)  50,000 Units Oral Q7 days     Data Reviewed:   CBG:  Recent Labs  Lab 09/21/23 0641 09/21/23 1224 09/21/23 1624 09/21/23 2122 09/22/23 0609  GLUCAP 81 175* 302* 341* 404*    SpO2: 99 % O2 Flow Rate (L/min): 3 L/min    Vitals:   09/21/23 1929 09/22/23 0006 09/22/23 0447 09/22/23 0720  BP: (!) 142/74 (!) 157/78 (!) 144/83 (!) 157/66  Pulse: 92   86  Resp: 19 18 17    Temp: 98.9 F (37.2 C) 98.7 F (37.1 C) 98.1 F (36.7 C) 97.8 F (36.6 C)  TempSrc: Oral Oral Oral Oral  SpO2: 98% 99% 99%   Weight:      Height:          Data Reviewed:  Basic Metabolic Panel: Recent Labs  Lab 09/17/23 0227 09/18/23 0230 09/18/23 0959 09/19/23 0943 09/20/23 0228 09/21/23 0236  NA 133* 128* 126*  --  130* 133*  K 3.8 4.5 4.3  --  4.1 3.8  CL 90* 87* 86*  --  90* 91*  CO2 28 28 24   --  26 28  GLUCOSE 374* 404* 451*  --  137* 60*  BUN 67* 78* 83*  --  67* 77*  CREATININE 5.66* 6.86* 7.24*  --  7.04* 7.84*  CALCIUM 8.1* 8.1* 7.9*  --  8.3* 8.2*  MG 1.8 1.7  --  1.7 1.7  --   PHOS 5.6* 6.6* 6.5* 5.4* 5.9*  --     CBC: Recent Labs  Lab 09/15/23 2146 09/17/23 0227 09/18/23 0230 09/18/23 0959 09/19/23 0943 09/20/23 0228 09/21/23 0236  WBC 7.9   < > 6.2 5.9 5.8 6.5 5.8  NEUTROABS 6.3  --   --   --   --   --   --   HGB 8.1*   < > 8.2* 8.7* 8.5* 9.4* 9.0*  HCT 24.4*   < > 26.2* 27.3* 27.8* 29.6* 28.3*  MCV 85.0   < > 87.3 89.8 90.0 89.7 89.3  PLT 127*   < > 119* 114* 105* 97* 105*   < > = values in this interval not displayed.    LFT Recent Labs  Lab 09/15/23 2146 09/18/23 0959  AST 12*  --   ALT 10  --   ALKPHOS 87  --    BILITOT 0.3  --   PROT 7.1  --   ALBUMIN 3.6 3.4*     Antibiotics: Anti-infectives (From admission, onward)    None        DVT prophylaxis: Heparin  Code Status: Full code  Family Communication: No family at bedside   CONSULTS nephrology   Subjective   Denies shortness of breath.  Objective    Physical Examination:  General-appears in no acute distress Heart-S1-S2, regular, no murmur auscultated Lungs-clear to auscultation bilaterally, no wheezing or crackles auscultated Abdomen-soft, nontender, no organomegaly Extremities-no edema in the lower extremities Neuro-alert, oriented x3, no focal deficit noted   Status is: Inpatient:          Meredeth Ide   [facility] If 7PM-7AM, please contact night-coverage at [email or web address], Office  [phone]   09/22/2023, 9:18 AM  LOS: 5 days

## 2023-09-22 NOTE — Plan of Care (Signed)

## 2023-09-23 DIAGNOSIS — R079 Chest pain, unspecified: Secondary | ICD-10-CM | POA: Diagnosis not present

## 2023-09-23 DIAGNOSIS — N186 End stage renal disease: Secondary | ICD-10-CM | POA: Diagnosis not present

## 2023-09-23 DIAGNOSIS — E1100 Type 2 diabetes mellitus with hyperosmolarity without nonketotic hyperglycemic-hyperosmolar coma (NKHHC): Secondary | ICD-10-CM | POA: Diagnosis not present

## 2023-09-23 LAB — CBC
HCT: 27.5 % — ABNORMAL LOW (ref 39.0–52.0)
Hemoglobin: 8.5 g/dL — ABNORMAL LOW (ref 13.0–17.0)
MCH: 27.6 pg (ref 26.0–34.0)
MCHC: 30.9 g/dL (ref 30.0–36.0)
MCV: 89.3 fL (ref 80.0–100.0)
Platelets: 104 10*3/uL — ABNORMAL LOW (ref 150–400)
RBC: 3.08 MIL/uL — ABNORMAL LOW (ref 4.22–5.81)
RDW: 16.2 % — ABNORMAL HIGH (ref 11.5–15.5)
WBC: 7.5 10*3/uL (ref 4.0–10.5)
nRBC: 0 % (ref 0.0–0.2)

## 2023-09-23 LAB — RENAL FUNCTION PANEL
Albumin: 3.8 g/dL (ref 3.5–5.0)
Anion gap: 16 — ABNORMAL HIGH (ref 5–15)
BUN: 83 mg/dL — ABNORMAL HIGH (ref 8–23)
CO2: 23 mmol/L (ref 22–32)
Calcium: 8.5 mg/dL — ABNORMAL LOW (ref 8.9–10.3)
Chloride: 91 mmol/L — ABNORMAL LOW (ref 98–111)
Creatinine, Ser: 7.58 mg/dL — ABNORMAL HIGH (ref 0.61–1.24)
GFR, Estimated: 7 mL/min — ABNORMAL LOW (ref >60)
Glucose, Bld: 125 mg/dL — ABNORMAL HIGH (ref 70–99)
Phosphorus: 7.1 mg/dL — ABNORMAL HIGH (ref 2.5–4.6)
Potassium: 4.3 mmol/L (ref 3.5–5.1)
Sodium: 130 mmol/L — ABNORMAL LOW (ref 135–145)

## 2023-09-23 LAB — GLUCOSE, CAPILLARY
Glucose-Capillary: 90 mg/dL (ref 70–99)
Glucose-Capillary: 139 mg/dL — ABNORMAL HIGH (ref 70–99)
Glucose-Capillary: 214 mg/dL — ABNORMAL HIGH (ref 70–99)
Glucose-Capillary: 252 mg/dL — ABNORMAL HIGH (ref 70–99)

## 2023-09-23 MED ORDER — HEPARIN SODIUM (PORCINE) 1000 UNIT/ML DIALYSIS: 1000.0000 [IU] | INTRAMUSCULAR | Status: DC | PRN

## 2023-09-23 MED ORDER — INSULIN REGULAR HUMAN (CONC) 500 UNIT/ML ~~LOC~~ SOPN
55.0000 [IU] | PEN_INJECTOR | Freq: Two times a day (BID) | SUBCUTANEOUS | Status: DC
Start: 2023-09-23 — End: 2023-09-24
  Administered 2023-09-23: 55 [IU] via SUBCUTANEOUS
  Filled 2023-09-23: qty 3

## 2023-09-23 MED ORDER — ANTICOAGULANT SODIUM CITRATE 4% (200MG/5ML) IV SOLN: 5.0000 mL | Status: DC | PRN

## 2023-09-23 MED ORDER — LIDOCAINE HCL (PF) 1 % IJ SOLN: 5.0000 mL | INTRAMUSCULAR | Status: DC | PRN

## 2023-09-23 MED ORDER — GUAIFENESIN ER 600 MG PO TB12
600.0000 mg | ORAL_TABLET | Freq: Two times a day (BID) | ORAL | Status: DC
  Administered 2023-09-23 – 2023-09-27 (×9): 600 mg via ORAL
  Filled 2023-09-23 (×10): qty 1

## 2023-09-23 MED ORDER — ALTEPLASE 2 MG IJ SOLR: 2.0000 mg | Freq: Once | INTRAMUSCULAR | Status: DC | PRN

## 2023-09-23 MED ORDER — INSULIN ASPART 100 UNIT/ML IJ SOLN
0.0000 [IU] | Freq: Three times a day (TID) | INTRAMUSCULAR | Status: DC
Start: 2023-09-23 — End: 2023-09-27
  Administered 2023-09-23: 3 [IU] via SUBCUTANEOUS
  Administered 2023-09-24: 2 [IU] via SUBCUTANEOUS
  Administered 2023-09-24: 3 [IU] via SUBCUTANEOUS
  Administered 2023-09-24: 1 [IU] via SUBCUTANEOUS
  Administered 2023-09-25: 2 [IU] via SUBCUTANEOUS
  Administered 2023-09-25: 7 [IU] via SUBCUTANEOUS
  Administered 2023-09-26 – 2023-09-27 (×3): 3 [IU] via SUBCUTANEOUS
  Administered 2023-09-27: 1 [IU] via SUBCUTANEOUS

## 2023-09-23 MED ORDER — DARBEPOETIN ALFA 200 MCG/0.4ML IJ SOSY
200.0000 ug | PREFILLED_SYRINGE | INTRAMUSCULAR | Status: DC
  Administered 2023-09-23: 200 ug via SUBCUTANEOUS
  Filled 2023-09-23: qty 0.4

## 2023-09-23 MED ORDER — PENTAFLUOROPROP-TETRAFLUOROETH EX AERO: 1.0000 | INHALATION_SPRAY | CUTANEOUS | Status: DC | PRN

## 2023-09-23 NOTE — Plan of Care (Signed)
  Problem: Clinical Measurements: Goal: Will remain free from infection Outcome: Progressing   Problem: Coping: Goal: Level of anxiety will decrease Outcome: Progressing   Problem: Pain Managment: Goal: General experience of comfort will improve Outcome: Progressing   Problem: Safety: Goal: Ability to remain free from injury will improve Outcome: Progressing   Problem: Skin Integrity: Goal: Risk for impaired skin integrity will decrease Outcome: Progressing   

## 2023-09-23 NOTE — Progress Notes (Signed)
[name] Progress Note    Assessment/ Plan:   ESRD  -outpatient HD orders: discharged from outpatient dialysis unit in Dec 2023 for behavioral issues. Dialyzes here, presents through ER on TTS. 3hrs 15 min. RUE AVG, heparin 2000 units. EDW 129kg -HD back to TTS schedule  Chest pain -workup and management per primary service, currently chest pain free  Volume/ hypertension  -EDW 129kg. Will UF as tolerated  Anemia of Chronic Kidney Disease -Hemoglobin 8.5. s/p Aranesp 200 mcg 10/21, will give another dose early--today -Transfuse PRN for Hgb <7  Secondary Hyperparathyroidism/Hyperphosphatemia -resume home binders -renal diet -monitor PO4  Vascular access -LUE AVG has been used without issues recently. Appreciate VVS's assistance--TDC removed 09/19/23  DM2 with hyperglycemia -per primary service  Thrombocytopenia -per primary service  Subjective:   Patient seen and examined bedside earlier this am and seen again on HD. No acute events. Toelrating treatment. He does report that he has been coughing, feels like he needs to bring up phlegm (deferring to primary service)   Objective:   BP 112/83   Pulse 81   Temp 97.8 F (36.6 C) (Oral)   Resp 13   Ht 5\' 5"  (1.651 m)   Wt 127.6 kg   SpO2 100%   BMI 46.81 kg/m   Intake/Output Summary (Last 24 hours) at 09/23/2023 1043 Last data filed at 09/23/2023 0807 Gross per 24 hour  Intake 960 ml  Output 1150 ml  Net -190 ml   Weight change:   Physical Exam: Gen: NAD CVS: RRR Resp: Normal work of breathing, unlabored, cta b/l Abd: Obese Ext: Left AKA, no sig edema b/l Les Neuro: awake Dialysis access: LUE AVG +b/t  Imaging: No results found.  Labs: BMET Recent Labs  Lab 09/17/23 0227 09/18/23 0230 09/18/23 0959 09/19/23 0943 09/20/23 0228 09/21/23 0236 09/22/23 1200 09/23/23 0823  NA 133* 128* 126*  --  130* 133* 131* 130*  K 3.8 4.5 4.3  --  4.1 3.8 4.5 4.3  CL 90* 87* 86*  --  90*  91* 88* 91*  CO2 28 28 24   --  26 28 31 23   GLUCOSE 374* 404* 451*  --  137* 60* 231* 125*  BUN 67* 78* 83*  --  67* 77* 68* 83*  CREATININE 5.66* 6.86* 7.24*  --  7.04* 7.84* 7.05* 7.58*  CALCIUM 8.1* 8.1* 7.9*  --  8.3* 8.2* 8.8* 8.5*  PHOS 5.6* 6.6* 6.5* 5.4* 5.9*  --   --  7.1*   CBC Recent Labs  Lab 09/20/23 0228 09/21/23 0236 09/22/23 1200 09/23/23 0823  WBC 6.5 5.8 5.4 7.5  HGB 9.4* 9.0* 8.9* 8.5*  HCT 29.6* 28.3* 28.7* 27.5*  MCV 89.7 89.3 90.5 89.3  PLT 97* 105* 105* 104*    Medications:     acetaminophen  650 mg Oral Q6H   amLODipine  10 mg Oral q morning   ascorbic acid  500 mg Oral Daily   aspirin EC  81 mg Oral q AM   atorvastatin  40 mg Oral q AM   calcium acetate  1,334 mg Oral TID with meals   carvedilol  12.5 mg Oral BID WC   Chlorhexidine Gluconate Cloth  6 each Topical Q0600   clopidogrel  75 mg Oral Daily   vitamin B-12  500 mcg Oral Daily   folic acid  1 mg Oral Daily   guaiFENesin  600 mg Oral BID   heparin injection (subcutaneous)  5,000 Units Subcutaneous Q8H  insulin aspart  0-20 Units Subcutaneous TID WC   insulin regular human CONCENTRATED  65 Units Subcutaneous BID WC   iron polysaccharides  150 mg Oral Daily   nortriptyline  20 mg Oral QHS   pantoprazole  40 mg Oral QAC breakfast   pregabalin  75 mg Oral QHS   sertraline  100 mg Oral q AM   tamsulosin  0.4 mg Oral Daily   torsemide  120 mg Oral BID   traZODone  150 mg Oral QHS   Vitamin D (Ergocalciferol)  50,000 Units Oral Q7 days      Anthony Sar, MD [facility] 09/23/2023, 10:43 AM

## 2023-09-23 NOTE — Plan of Care (Signed)

## 2023-09-23 NOTE — Progress Notes (Signed)
[facility]  PROGRESS NOTE  Jeffrey [name] UJW:[id number] DOB: 1961-08-28 DOA: 09/15/2023 PCP: Knox Royalty, MD   Brief HPI:    62 y.o. male with medical history significant of end-stage renal disease on hemodialysis on Tuesday, Thursdays and Saturdays, obstructive sleep apnea, COPD, chronic hypoxic respiratory failure on 3 L/min of oxygen at home diabetes mellitus type 2 on insulin, peripheral arterial disease status post left above-knee amputation and history of congestive heart failure.  Patient presented to the emergency room last night with complaints of upper extremity cramps radiating through his back.  Patient has periodic spasms but symptoms from last night was the worst he had ever experienced.  Patient apparently had missed his last session of hemodialysis on Thursday.  He also complained of chest tightness and shortness of breath which is chronic.  He denied any associated nausea, vomiting or palpitations.  In the ED, CT scan of the chest done ruled out pulm emboli or aortic dissection.  EKG was also done and essentially unrevealing for any evidence of acute ischemia.  He denies any prior history of coronary disease.  Last echo from July 2024 showed preserved left ventricular ejection fraction of 55 to 60% with no obvious wall motion abnormalities.  Mild aortic sclerosis however were noted.     Assessment/Plan:   Chest pain -Resolved -Appears atypical, likely musculoskeletal -Cardiac enzymes unremarkable, EKG unrevealing -Last echo showed preserved EF with no wall motion abnormalities -Can follow-up with cardiology as outpatient -Patient takes aspirin and Plavix along with atorvastatin at home  ESRD -Frequent ER visits for hemodialysis -Missed last dialysis session due to social issues -He was emergently dialyzed on presentation in the ED -Nephrology following  Diabetes mellitus type 2 -Hemoglobin A1c 11.1 -Found to be in hyperosmolar hyperglycemic state; started on IV  insulin infusion which was transitioned to subcutaneous basal insulin along with sliding scale -Diabetes coordinator consulted -Currently on Humulin R 70 units twice daily along with NovoLog sliding scale -Blood glucose has been on lower side, changed Humulin R to 65 units subcu twice daily -10/26 -morning dose of Humulin R held due to hypoglycemia today  Peripheral arterial disease -Complicated by left above-knee amputation -Patient is currently on aspirin and Plavix  COPD with chronic respiratory failure -Currently on 3 L/min of oxygen -Continue as needed bronchodilators  Hypertension -Blood pressure has been elevated -Continue amlodipine, carvedilol, torsemide -Started hydralazine 25 mg p.o. every 6 hours as needed for BP greater than 160/100  OSA -Uses CPAP at bedtime at home  Anemia of chronic disease -Secondary to ESRD -Continue darbepoetin as per nephrology  Iron deficiency anemia -Transferrin saturation 15% -Started on iron supplementation with vitamin C -Follow with PCP to check serum iron as outpatient in 3 to 6 months  Folic acid deficiency -Continue folic acid supplementation  Vitamin D deficiency -Started vitamin D 50,000 units p.o. weekly -Recheck vitamin D level at PCP office in 3 months  Vitamin B12 level 390, goal >400 -started vitamin B12 500 mcg p.o. daily to prevent from deficiency.   Hyponatremia -Sodium is 130 -Patient is currently on hemodialysis  Medications     acetaminophen  650 mg Oral Q6H   amLODipine  10 mg Oral q morning   ascorbic acid  500 mg Oral Daily   aspirin EC  81 mg Oral q AM   atorvastatin  40 mg Oral q AM   calcium acetate  1,334 mg Oral TID with meals   carvedilol  12.5 mg Oral BID WC  Chlorhexidine Gluconate Cloth  6 each Topical Q0600   clopidogrel  75 mg Oral Daily   vitamin B-12  500 mcg Oral Daily   darbepoetin (ARANESP) injection - DIALYSIS  200 mcg Subcutaneous Q Sat-1800   folic acid  1 mg Oral Daily    guaiFENesin  600 mg Oral BID   heparin injection (subcutaneous)  5,000 Units Subcutaneous Q8H   insulin aspart  0-20 Units Subcutaneous TID WC   insulin regular human CONCENTRATED  65 Units Subcutaneous BID WC   iron polysaccharides  150 mg Oral Daily   nortriptyline  20 mg Oral QHS   pantoprazole  40 mg Oral QAC breakfast   pregabalin  75 mg Oral QHS   sertraline  100 mg Oral q AM   tamsulosin  0.4 mg Oral Daily   torsemide  120 mg Oral BID   traZODone  150 mg Oral QHS   Vitamin D (Ergocalciferol)  50,000 Units Oral Q7 days     Data Reviewed:   CBG:  Recent Labs  Lab 09/22/23 0609 09/22/23 1116 09/22/23 1619 09/22/23 2102 09/23/23 0613  GLUCAP 404* 228* 156* 130* 90    SpO2: 99 % O2 Flow Rate (L/min): 3 L/min    Vitals:   09/23/23 1000 09/23/23 1030 09/23/23 1100 09/23/23 1130  BP: (!) 142/85 112/83 124/62 (!) 146/75  Pulse: 81 81 83 80  Resp: 17 13 (!) 21 15  Temp:      TempSrc:      SpO2: 98% 100% 99% 99%  Weight:      Height:          Data Reviewed:  Basic Metabolic Panel: Recent Labs  Lab 09/17/23 0227 09/18/23 0230 09/18/23 0959 09/19/23 0943 09/20/23 0228 09/21/23 0236 09/22/23 1200 09/23/23 0823  NA 133* 128* 126*  --  130* 133* 131* 130*  K 3.8 4.5 4.3  --  4.1 3.8 4.5 4.3  CL 90* 87* 86*  --  90* 91* 88* 91*  CO2 28 28 24   --  26 28 31 23   GLUCOSE 374* 404* 451*  --  137* 60* 231* 125*  BUN 67* 78* 83*  --  67* 77* 68* 83*  CREATININE 5.66* 6.86* 7.24*  --  7.04* 7.84* 7.05* 7.58*  CALCIUM 8.1* 8.1* 7.9*  --  8.3* 8.2* 8.8* 8.5*  MG 1.8 1.7  --  1.7 1.7  --   --   --   PHOS 5.6* 6.6* 6.5* 5.4* 5.9*  --   --  7.1*    CBC: Recent Labs  Lab 09/19/23 0943 09/20/23 0228 09/21/23 0236 09/22/23 1200 09/23/23 0823  WBC 5.8 6.5 5.8 5.4 7.5  HGB 8.5* 9.4* 9.0* 8.9* 8.5*  HCT 27.8* 29.6* 28.3* 28.7* 27.5*  MCV 90.0 89.7 89.3 90.5 89.3  PLT 105* 97* 105* 105* 104*    LFT Recent Labs  Lab 09/18/23 0959 09/23/23 0823  ALBUMIN  3.4* 3.8     Antibiotics: Anti-infectives (From admission, onward)    None        DVT prophylaxis: Heparin  Code Status: Full code  Family Communication: No family at bedside   CONSULTS nephrology   Subjective   Denies shortness of breath.  Objective    Physical Examination:  General-appears in no acute distress Heart-S1-S2, regular, no murmur auscultated Lungs-clear to auscultation bilaterally, no wheezing or crackles auscultated Abdomen-soft, nontender, no organomegaly Extremities-no edema in the lower extremities Neuro-alert, oriented x3, no focal deficit noted  Status is: Inpatient:  Meredeth Ide   [facility] If 7PM-7AM, please contact night-coverage at [email or web address], Office  [phone]   09/23/2023, 11:50 AM  LOS: 6 days

## 2023-09-23 NOTE — Progress Notes (Signed)
Received patient in bed to unit.  Alert and oriented.  Informed consent signed and in chart.   TX duration:3.5 hours  Patient tolerated well.  Transported back to the room  Alert, without acute distress.  Hand-off given to patient's nurse.   Access used: Left Upper Arm Fistula Access issues: none  Total UF removed: 3L Medication(s) given: Mucinex, Oxycodone   09/23/23 1230  Vitals  Temp 98.4 F (36.9 C)  Temp Source Oral  BP (!) 150/87  MAP (mmHg) 91  Pulse Rate 82  ECG Heart Rate 82  Resp 10  Oxygen Therapy  SpO2 100 %  O2 Device Nasal Cannula  O2 Flow Rate (L/min) 3 L/min  During Treatment Monitoring  Duration of HD Treatment -hour(s) 3.5 hour(s)  HD Safety Checks Performed Yes  Intra-Hemodialysis Comments Tx completed;Tolerated well  Dialysis Fluid Bolus Normal Saline  Bolus Amount (mL) 300 mL  Fistula / Graft Left Upper arm Arteriovenous vein graft  Placement Date/Time: (c) 04/22/23 (c) 1000   Orientation: Left  Access Location: Upper arm  Access Type: Arteriovenous vein graft  Status Deaccessed     Stacie Glaze LPN [facility]

## 2023-09-24 ENCOUNTER — Inpatient Hospital Stay (HOSPITAL_COMMUNITY): Payer: 59

## 2023-09-24 DIAGNOSIS — E1100 Type 2 diabetes mellitus with hyperosmolarity without nonketotic hyperglycemic-hyperosmolar coma (NKHHC): Secondary | ICD-10-CM | POA: Diagnosis not present

## 2023-09-24 DIAGNOSIS — E559 Vitamin D deficiency, unspecified: Secondary | ICD-10-CM | POA: Diagnosis not present

## 2023-09-24 DIAGNOSIS — R079 Chest pain, unspecified: Secondary | ICD-10-CM | POA: Diagnosis not present

## 2023-09-24 DIAGNOSIS — D509 Iron deficiency anemia, unspecified: Secondary | ICD-10-CM | POA: Diagnosis not present

## 2023-09-24 LAB — GLUCOSE, CAPILLARY
Glucose-Capillary: 229 mg/dL — ABNORMAL HIGH (ref 70–99)
Glucose-Capillary: 194 mg/dL — ABNORMAL HIGH (ref 70–99)
Glucose-Capillary: 137 mg/dL — ABNORMAL HIGH (ref 70–99)
Glucose-Capillary: 123 mg/dL — ABNORMAL HIGH (ref 70–99)
Glucose-Capillary: 87 mg/dL (ref 70–99)
Glucose-Capillary: 68 mg/dL — ABNORMAL LOW (ref 70–99)
Glucose-Capillary: 87 mg/dL (ref 70–99)

## 2023-09-24 MED ORDER — POLYETHYLENE GLYCOL 3350 17 G PO PACK
17.0000 g | PACK | Freq: Every day | ORAL | Status: DC
  Administered 2023-09-24 – 2023-09-27 (×4): 17 g via ORAL
  Filled 2023-09-24 (×4): qty 1

## 2023-09-24 MED ORDER — DEXTROSE 50 % IV SOLN
12.5000 g | INTRAVENOUS | Status: AC
  Administered 2023-09-24: 12.5 g via INTRAVENOUS
  Filled 2023-09-24: qty 50

## 2023-09-24 MED ORDER — INSULIN REGULAR HUMAN (CONC) 500 UNIT/ML ~~LOC~~ SOPN
65.0000 [IU] | PEN_INJECTOR | Freq: Two times a day (BID) | SUBCUTANEOUS | Status: DC
  Administered 2023-09-24 (×2): 65 [IU] via SUBCUTANEOUS
  Filled 2023-09-24: qty 3

## 2023-09-24 MED ORDER — CALCIUM ACETATE (PHOS BINDER) 667 MG PO CAPS
2001.0000 mg | ORAL_CAPSULE | Freq: Three times a day (TID) | ORAL | Status: DC
  Administered 2023-09-24 – 2023-09-27 (×8): 2001 mg via ORAL
  Filled 2023-09-24 (×13): qty 3

## 2023-09-24 NOTE — Plan of Care (Signed)
  Problem: Education: Goal: Knowledge of General Education information will improve Description: Including pain rating scale, medication(s)/side effects and non-pharmacologic comfort measures Outcome: Progressing   Problem: Health Behavior/Discharge Planning: Goal: Ability to manage health-related needs will improve Outcome: Progressing   Problem: Clinical Measurements: Goal: Ability to maintain clinical measurements within normal limits will improve Outcome: Progressing Goal: Will remain free from infection Outcome: Progressing Goal: Diagnostic test results will improve Outcome: Progressing Goal: Respiratory complications will improve Outcome: Progressing Goal: Cardiovascular complication will be avoided Outcome: Progressing   Problem: Activity: Goal: Risk for activity intolerance will decrease Outcome: Progressing   Problem: Nutrition: Goal: Adequate nutrition will be maintained Outcome: Progressing   Problem: Coping: Goal: Level of anxiety will decrease Outcome: Progressing   Problem: Elimination: Goal: Will not experience complications related to bowel motility Outcome: Progressing Goal: Will not experience complications related to urinary retention Outcome: Progressing   Problem: Pain Managment: Goal: General experience of comfort will improve Outcome: Progressing   Problem: Safety: Goal: Ability to remain free from injury will improve Outcome: Progressing   Problem: Skin Integrity: Goal: Risk for impaired skin integrity will decrease Outcome: Progressing   Problem: Education: Goal: Ability to describe self-care measures that may prevent or decrease complications (Diabetes Survival Skills Education) will improve Outcome: Progressing Goal: Individualized Educational Video(s) Outcome: Progressing   Problem: Coping: Goal: Ability to adjust to condition or change in health will improve Outcome: Progressing   Problem: Fluid Volume: Goal: Ability to  maintain a balanced intake and output will improve Outcome: Progressing   Problem: Health Behavior/Discharge Planning: Goal: Ability to identify and utilize available resources and services will improve Outcome: Progressing Goal: Ability to manage health-related needs will improve Outcome: Progressing   Problem: Metabolic: Goal: Ability to maintain appropriate glucose levels will improve Outcome: Progressing   Problem: Nutritional: Goal: Maintenance of adequate nutrition will improve Outcome: Progressing Goal: Progress toward achieving an optimal weight will improve Outcome: Progressing   Problem: Skin Integrity: Goal: Risk for impaired skin integrity will decrease Outcome: Progressing   Problem: Tissue Perfusion: Goal: Adequacy of tissue perfusion will improve Outcome: Progressing   Problem: Education: Goal: Ability to describe self-care measures that may prevent or decrease complications (Diabetes Survival Skills Education) will improve Outcome: Progressing Goal: Individualized Educational Video(s) Outcome: Progressing   Problem: Cardiac: Goal: Ability to maintain an adequate cardiac output will improve Outcome: Progressing   Problem: Health Behavior/Discharge Planning: Goal: Ability to identify and utilize available resources and services will improve Outcome: Progressing Goal: Ability to manage health-related needs will improve Outcome: Progressing   Problem: Fluid Volume: Goal: Ability to achieve a balanced intake and output will improve Outcome: Progressing   Problem: Metabolic: Goal: Ability to maintain appropriate glucose levels will improve Outcome: Progressing   Problem: Nutritional: Goal: Maintenance of adequate nutrition will improve Outcome: Progressing Goal: Maintenance of adequate weight for body size and type will improve Outcome: Progressing   Problem: Respiratory: Goal: Will regain and/or maintain adequate ventilation Outcome: Progressing    Problem: Urinary Elimination: Goal: Ability to achieve and maintain adequate renal perfusion and functioning will improve Outcome: Progressing   

## 2023-09-24 NOTE — Progress Notes (Signed)
[facility]  PROGRESS NOTE  Jeffrey [name] WUJ:[id number] DOB: Dec 16, 1960 DOA: 09/15/2023 PCP: Knox Royalty, MD   Brief HPI:    62 y.o. male with medical history significant of end-stage renal disease on hemodialysis on Tuesday, Thursdays and Saturdays, obstructive sleep apnea, COPD, chronic hypoxic respiratory failure on 3 L/min of oxygen at home diabetes mellitus type 2 on insulin, peripheral arterial disease status post left above-knee amputation and history of congestive heart failure.  Patient presented to the emergency room last night with complaints of upper extremity cramps radiating through his back.  Patient has periodic spasms but symptoms from last night was the worst he had ever experienced.  Patient apparently had missed his last session of hemodialysis on Thursday.  He also complained of chest tightness and shortness of breath which is chronic.  He denied any associated nausea, vomiting or palpitations.  In the ED, CT scan of the chest done ruled out pulm emboli or aortic dissection.  EKG was also done and essentially unrevealing for any evidence of acute ischemia.  He denies any prior history of coronary disease.  Last echo from July 2024 showed preserved left ventricular ejection fraction of 55 to 60% with no obvious wall motion abnormalities.  Mild aortic sclerosis however were noted.     Assessment/Plan:   Chest pain -Resolved -Appears atypical, likely musculoskeletal -Cardiac enzymes unremarkable, EKG unrevealing -Last echo showed preserved EF with no wall motion abnormalities -Can follow-up with cardiology as outpatient -Patient takes aspirin and Plavix along with atorvastatin at home  Acute hypoxemic respiratory failure -Likely in setting of volume overload, has been dialyzed since admission -Still requiring 3 L/min of oxygen -Will try to wean off oxygen today  ESRD -Frequent ER visits for hemodialysis -Missed last dialysis session due to social issues -He was  emergently dialyzed on presentation in the ED -Nephrology following  Diabetes mellitus type 2 -Hemoglobin A1c 11.1 -Found to be in hyperosmolar hyperglycemic state; started on IV insulin infusion which was transitioned to subcutaneous basal insulin along with sliding scale -Diabetes coordinator consulted -Currently on Humulin R 70 units twice daily along with NovoLog sliding scale -Blood glucose has been on lower side, changed Humulin R to 65 units subcu twice daily   Peripheral arterial disease -Complicated by left above-knee amputation -Patient is currently on aspirin and Plavix  COPD with chronic respiratory failure -Currently on 3 L/min of oxygen -Continue as needed bronchodilators -Try to wean off oxygen, he is not on oxygen at home  Hypertension -Blood pressure has been elevated -Continue amlodipine, carvedilol, torsemide -Started hydralazine 25 mg p.o. every 6 hours as needed for BP greater than 160/100  OSA -Uses CPAP at bedtime at home  Anemia of chronic disease -Secondary to ESRD -Continue darbepoetin as per nephrology  Iron deficiency anemia -Transferrin saturation 15% -Started on iron supplementation with vitamin C -Follow with PCP to check serum iron as outpatient in 3 to 6 months  Folic acid deficiency -Continue folic acid supplementation  Vitamin D deficiency -Started vitamin D 50,000 units p.o. weekly -Recheck vitamin D level at PCP office in 3 months  Vitamin B12 level 390, goal >400 -started vitamin B12 500 mcg p.o. daily to prevent from deficiency.   Hyponatremia -Sodium is 130 -Patient is currently on hemodialysis  Medications     acetaminophen  650 mg Oral Q6H   amLODipine  10 mg Oral q morning   ascorbic acid  500 mg Oral Daily   aspirin EC  81 mg Oral q AM  atorvastatin  40 mg Oral q AM   calcium acetate  1,334 mg Oral TID with meals   carvedilol  12.5 mg Oral BID WC   Chlorhexidine Gluconate Cloth  6 each Topical Q0600    clopidogrel  75 mg Oral Daily   vitamin B-12  500 mcg Oral Daily   darbepoetin (ARANESP) injection - DIALYSIS  200 mcg Subcutaneous Q Sat-1800   folic acid  1 mg Oral Daily   guaiFENesin  600 mg Oral BID   heparin injection (subcutaneous)  5,000 Units Subcutaneous Q8H   insulin aspart  0-9 Units Subcutaneous TID WC   insulin regular human CONCENTRATED  55 Units Subcutaneous BID WC   iron polysaccharides  150 mg Oral Daily   nortriptyline  20 mg Oral QHS   pantoprazole  40 mg Oral QAC breakfast   pregabalin  75 mg Oral QHS   sertraline  100 mg Oral q AM   tamsulosin  0.4 mg Oral Daily   torsemide  120 mg Oral BID   traZODone  150 mg Oral QHS   Vitamin D (Ergocalciferol)  50,000 Units Oral Q7 days     Data Reviewed:   CBG:  Recent Labs  Lab 09/23/23 0613 09/23/23 1347 09/23/23 1528 09/23/23 2104 09/24/23 0616  GLUCAP 90 139* 214* 252* 229*    SpO2: 100 % O2 Flow Rate (L/min): 3 L/min    Vitals:   09/23/23 1525 09/23/23 1927 09/23/23 2250 09/24/23 0414  BP: (!) 154/87 (!) 160/82 (!) 148/71 (!) 147/73  Pulse:  88    Resp: 17 17 10    Temp: 98.5 F (36.9 C) 98.4 F (36.9 C) 98.6 F (37 C) 98.6 F (37 C)  TempSrc: Oral Oral Oral Oral  SpO2:  100%    Weight:      Height:          Data Reviewed:  Basic Metabolic Panel: Recent Labs  Lab 09/18/23 0230 09/18/23 0959 09/19/23 0943 09/20/23 0228 09/21/23 0236 09/22/23 1200 09/23/23 0823  NA 128* 126*  --  130* 133* 131* 130*  K 4.5 4.3  --  4.1 3.8 4.5 4.3  CL 87* 86*  --  90* 91* 88* 91*  CO2 28 24  --  26 28 31 23   GLUCOSE 404* 451*  --  137* 60* 231* 125*  BUN 78* 83*  --  67* 77* 68* 83*  CREATININE 6.86* 7.24*  --  7.04* 7.84* 7.05* 7.58*  CALCIUM 8.1* 7.9*  --  8.3* 8.2* 8.8* 8.5*  MG 1.7  --  1.7 1.7  --   --   --   PHOS 6.6* 6.5* 5.4* 5.9*  --   --  7.1*    CBC: Recent Labs  Lab 09/19/23 0943 09/20/23 0228 09/21/23 0236 09/22/23 1200 09/23/23 0823  WBC 5.8 6.5 5.8 5.4 7.5  HGB 8.5*  9.4* 9.0* 8.9* 8.5*  HCT 27.8* 29.6* 28.3* 28.7* 27.5*  MCV 90.0 89.7 89.3 90.5 89.3  PLT 105* 97* 105* 105* 104*    LFT Recent Labs  Lab 09/18/23 0959 09/23/23 0823  ALBUMIN 3.4* 3.8     Antibiotics: Anti-infectives (From admission, onward)    None        DVT prophylaxis: Heparin  Code Status: Full code  Family Communication: No family at bedside   CONSULTS nephrology   Subjective   Denies shortness of breath  Objective    Physical Examination:  General-appears in no acute distress Heart-S1-S2, regular, no murmur auscultated Lungs-clear  to auscultation bilaterally, no wheezing or crackles auscultated Abdomen-soft, nontender, no organomegaly Extremities-no edema in the lower extremities Neuro-alert, oriented x3, no focal deficit noted  Status is: Inpatient:          Meredeth Ide   [facility] If 7PM-7AM, please contact night-coverage at [email or web address], Office  [phone]   09/24/2023, 8:13 AM  LOS: 7 days

## 2023-09-24 NOTE — Progress Notes (Signed)
[facility] Progress Note    Assessment/ Plan:   ESRD  -outpatient HD orders: discharged from outpatient dialysis unit in Dec 2023 for behavioral issues. Dialyzes here, presents through ER on TTS. 3hrs 15 min. RUE AVG, heparin 2000 units. EDW 129kg -HD back to TTS schedule, next on 10/29 if still here  Chest pain -workup and management per primary service, has been chest pain free  Volume/ hypertension  -EDW 129kg. Will UF as tolerated  Anemia of Chronic Kidney Disease -Hemoglobin 8.5. s/p Aranesp 200 mcg 10/21, will give another dose early--today -Transfuse PRN for Hgb <7  Secondary Hyperparathyroidism/Hyperphosphatemia -resume home binders -renal diet -monitor PO4-7.1, inc'ing phoslo to 3 tabs tidac. If still uncontrolled then change diet to renal diet  Vascular access -LUE AVG has been used without issues recently. Appreciate VVS's assistance--TDC removed 09/19/23  DM2 with hyperglycemia -per primary service  Thrombocytopenia -per primary service  Subjective:   Patient seen and examined bedside earlier this am. He feels like his congestion is a little better after HD yesterday but does feel like he still has some phelgm to bring up. Otherwise no other complaints Tolerated HD yesterday with net UF 3L   Objective:   BP (!) 147/73 (BP Location: Right Leg)   Pulse 88   Temp 98.6 F (37 C) (Oral)   Resp 10   Ht 5\' 5"  (1.651 m)   Wt 124.7 kg   SpO2 100%   BMI 45.75 kg/m   Intake/Output Summary (Last 24 hours) at 09/24/2023 1610 Last data filed at 09/24/2023 0600 Gross per 24 hour  Intake 390 ml  Output 3300 ml  Net -2910 ml   Weight change:   Physical Exam: Gen: NAD, in recliner CVS: RRR Resp: Normal work of breathing, unlabored, cta b/l Abd: Obese Ext: Left AKA, no sig edema b/l Les Neuro: awake Dialysis access: LUE AVG +b/t  Imaging: No results found.  Labs: BMET Recent Labs  Lab 09/18/23 0230 09/18/23 0959 09/19/23 0943  09/20/23 0228 09/21/23 0236 09/22/23 1200 09/23/23 0823  NA 128* 126*  --  130* 133* 131* 130*  K 4.5 4.3  --  4.1 3.8 4.5 4.3  CL 87* 86*  --  90* 91* 88* 91*  CO2 28 24  --  26 28 31 23   GLUCOSE 404* 451*  --  137* 60* 231* 125*  BUN 78* 83*  --  67* 77* 68* 83*  CREATININE 6.86* 7.24*  --  7.04* 7.84* 7.05* 7.58*  CALCIUM 8.1* 7.9*  --  8.3* 8.2* 8.8* 8.5*  PHOS 6.6* 6.5* 5.4* 5.9*  --   --  7.1*   CBC Recent Labs  Lab 09/20/23 0228 09/21/23 0236 09/22/23 1200 09/23/23 0823  WBC 6.5 5.8 5.4 7.5  HGB 9.4* 9.0* 8.9* 8.5*  HCT 29.6* 28.3* 28.7* 27.5*  MCV 89.7 89.3 90.5 89.3  PLT 97* 105* 105* 104*    Medications:     acetaminophen  650 mg Oral Q6H   amLODipine  10 mg Oral q morning   ascorbic acid  500 mg Oral Daily   aspirin EC  81 mg Oral q AM   atorvastatin  40 mg Oral q AM   calcium acetate  1,334 mg Oral TID with meals   carvedilol  12.5 mg Oral BID WC   Chlorhexidine Gluconate Cloth  6 each Topical Q0600   clopidogrel  75 mg Oral Daily   vitamin B-12  500 mcg Oral Daily   darbepoetin (ARANESP) injection -  DIALYSIS  200 mcg Subcutaneous Q Sat-1800   folic acid  1 mg Oral Daily   guaiFENesin  600 mg Oral BID   heparin injection (subcutaneous)  5,000 Units Subcutaneous Q8H   insulin aspart  0-9 Units Subcutaneous TID WC   insulin regular human CONCENTRATED  55 Units Subcutaneous BID WC   iron polysaccharides  150 mg Oral Daily   nortriptyline  20 mg Oral QHS   pantoprazole  40 mg Oral QAC breakfast   pregabalin  75 mg Oral QHS   sertraline  100 mg Oral q AM   tamsulosin  0.4 mg Oral Daily   torsemide  120 mg Oral BID   traZODone  150 mg Oral QHS   Vitamin D (Ergocalciferol)  50,000 Units Oral Q7 days      Anthony Sar, MD [facility] 09/24/2023, 8:22 AM

## 2023-09-24 NOTE — Progress Notes (Addendum)
Pt complaining of new onset blurred vision. Pt states he is having numbness/tingling in his neck. BS: 87. MD notified. Orders placed. Will recheck BS in 15 mins.

## 2023-09-25 ENCOUNTER — Inpatient Hospital Stay (HOSPITAL_COMMUNITY): Payer: 59

## 2023-09-25 DIAGNOSIS — R079 Chest pain, unspecified: Secondary | ICD-10-CM | POA: Diagnosis not present

## 2023-09-25 DIAGNOSIS — E1100 Type 2 diabetes mellitus with hyperosmolarity without nonketotic hyperglycemic-hyperosmolar coma (NKHHC): Secondary | ICD-10-CM | POA: Diagnosis not present

## 2023-09-25 DIAGNOSIS — E559 Vitamin D deficiency, unspecified: Secondary | ICD-10-CM | POA: Diagnosis not present

## 2023-09-25 DIAGNOSIS — D509 Iron deficiency anemia, unspecified: Secondary | ICD-10-CM | POA: Diagnosis not present

## 2023-09-25 LAB — GLUCOSE, CAPILLARY
Glucose-Capillary: 102 mg/dL — ABNORMAL HIGH (ref 70–99)
Glucose-Capillary: 183 mg/dL — ABNORMAL HIGH (ref 70–99)
Glucose-Capillary: 309 mg/dL — ABNORMAL HIGH (ref 70–99)
Glucose-Capillary: 269 mg/dL — ABNORMAL HIGH (ref 70–99)

## 2023-09-25 MED ORDER — CHLORHEXIDINE GLUCONATE CLOTH 2 % EX PADS
6.0000 | MEDICATED_PAD | Freq: Every day | CUTANEOUS | Status: DC
  Administered 2023-09-26 – 2023-09-27 (×2): 6 via TOPICAL

## 2023-09-25 MED ORDER — INSULIN REGULAR HUMAN (CONC) 500 UNIT/ML ~~LOC~~ SOPN
55.0000 [IU] | PEN_INJECTOR | Freq: Two times a day (BID) | SUBCUTANEOUS | Status: DC
  Administered 2023-09-25 – 2023-09-27 (×4): 55 [IU] via SUBCUTANEOUS
  Filled 2023-09-25: qty 3
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
## 2023-09-25 NOTE — Discharge Instructions (Signed)
Local Endocrinologists [facility] ([phone]) Dr. Cristina Gherghe Dr. Ajay Kumar [facility] ([phone]) Dr. Jeffrey Kerr [facility] ([phone]) Dr. Bindubal Balan Dr. Walter Kohut [facility] ([phone]) Dr. Stephen [facility] ([phone]) [[place] office]  ([phone]) [[place] office] Dr. Melissa Solum Dr. Thomas O'Connell [facility] ([facility]) ([phone]) Autumn Hudnall ([place]), PA Dr. Dhaval Patel Dr. William Smith Jr. [facility] ([phone]) Dr. Gebre Nida [facility] ([phone]) Dr. Micheal Brennan Dr. Jennifer Badik Dr. Ashley Jessup Spencer Beasley, FNP Dr. Monica E. Doerr in [place]  ([phone]) 

## 2023-09-25 NOTE — Inpatient Diabetes Management (Addendum)
Inpatient Diabetes Program Recommendations  AACE/ADA: New Consensus Statement on Inpatient Glycemic Control (2015)  Target Ranges:  Prepandial:   less than 140 mg/dL      Peak postprandial:   less than 180 mg/dL (1-2 hours)      Critically ill patients:  140 - 180 mg/dL   Lab Results  Component Value Date   GLUCAP 102 (H) 09/25/2023   HGBA1C 11.1 (H) 09/17/2023    Review of Glycemic Control  Latest Reference Range & Units 09/24/23 19:01 09/24/23 21:32 09/24/23 22:25 09/25/23 06:35  Glucose-Capillary 70 - 99 mg/dL 829 (H) 68 (L) 87 562 (H)  (H): Data is abnormally high (L): Data is abnormally low Diabetes history: type 2 Outpatient Diabetes medications: U-500 insulin 70-125 units BID If CBG<100 mg/dl, take 70 units If CBG 130-865 mg/dl, take 784 units If >696 mg/dl, take 295 units Current orders for Inpatient glycemic control: U-500 insulin 65 units BID, Novolog 0-9 units correction scale TID   Inpatient Diabetes Program Recommendations:    Noted mild hypoglycemia yesterday.  Consider reducing U-500 to 60 units BID.  Addendum: Spoke with patient regarding Freestyle libre 3. Had additional questions that warranted a consult. Patient has sensor applied to arm. Device is reading and it appears to be pairing with fingersticks being performed while inpatient. No additional questions at this time.  Did note that patient's trends in the middle of night were significantly higher due to patient's meal not aligning with [facility] scheduling. Patient reports elevated post prandials. Would anticipate need for dose changes. Encouraged to follow up with endocrinology at [facility]. Patient requesting list for local; will attach.  Discharge Recommendations: Other recommendations: Freestyle Libre 3 sensor [phone] Supply/Referral recommendations: Glucometer Test strips Lancet device Lancets   Use Adult Diabetes Insulin Treatment Post Discharge order set.   Thanks, Lujean Rave, MSN,  RNC-OB Diabetes Coordinator [phone] ([phone])
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
## 2023-09-25 NOTE — Progress Notes (Addendum)
[facility]  PROGRESS NOTE  RAJESH DHALIWAL AOZ:[id number] DOB: 25-Dec-1960 DOA: 09/15/2023 PCP: Knox Royalty, MD   Brief HPI:    62 y.o. male with medical history significant of end-stage renal disease on hemodialysis on Tuesday, Thursdays and Saturdays, obstructive sleep apnea, COPD, chronic hypoxic respiratory failure on 3 L/min of oxygen at home diabetes mellitus type 2 on insulin, peripheral arterial disease status post left above-knee amputation and history of congestive heart failure.  Patient presented to the emergency room last night with complaints of upper extremity cramps radiating through his back.  Patient has periodic spasms but symptoms from last night was the worst he had ever experienced.  Patient apparently had missed his last session of hemodialysis on Thursday.  He also complained of chest tightness and shortness of breath which is chronic.  He denied any associated nausea, vomiting or palpitations.  In the ED, CT scan of the chest done ruled out pulm emboli or aortic dissection.  EKG was also done and essentially unrevealing for any evidence of acute ischemia.  He denies any prior history of coronary disease.  Last echo from July 2024 showed preserved left ventricular ejection fraction of 55 to 60% with no obvious wall motion abnormalities.  Mild aortic sclerosis however were noted.     Assessment/Plan:   Chest pain -Resolved -Appears atypical, likely musculoskeletal -Cardiac enzymes unremarkable, EKG unrevealing -Last echo showed preserved EF with no wall motion abnormalities -Can follow-up with cardiology as outpatient -Patient takes aspirin and Plavix along with atorvastatin at home  Neck pain/tingling -Patient complained of neck pain yesterday, MRI cervical spine showed edema within the C4-5 intervertebral disc space -CT cervical spine recommended to rule out fracture -Will consult neurosurgery and order CT cervical spine without contrast  Acute hypoxemic  respiratory failure -Likely in setting of volume overload, has been dialyzed since admission -Still requiring 3 L/min of oxygen -Will try to wean off oxygen today  ESRD -Frequent ER visits for hemodialysis -Missed last dialysis session due to social issues -He was emergently dialyzed on presentation in the ED -Nephrology following  Diabetes mellitus type 2 -Hemoglobin A1c 11.1 -Found to be in hyperosmolar hyperglycemic state; started on IV insulin infusion which was transitioned to subcutaneous basal insulin along with sliding scale -Diabetes coordinator consulted -Currently on Humulin R 70 units twice daily along with NovoLog sliding scale -Blood glucose has been on lower side, will change Humulin R to 55 units subcu twice daily   Peripheral arterial disease -Complicated by left above-knee amputation -Patient is currently on aspirin and Plavix  COPD with chronic respiratory failure -Currently on 3 L/min of oxygen -Continue as needed bronchodilators -Try to wean off oxygen, he is not on oxygen at home  Hypertension -Blood pressure has been elevated -Continue amlodipine, carvedilol, torsemide -Started hydralazine 25 mg p.o. every 6 hours as needed for BP greater than 160/100  OSA -Uses CPAP at bedtime at home  Anemia of chronic disease -Secondary to ESRD -Continue darbepoetin as per nephrology  Iron deficiency anemia -Transferrin saturation 15% -Started on iron supplementation with vitamin C -Follow with PCP to check serum iron as outpatient in 3 to 6 months  Folic acid deficiency -Continue folic acid supplementation  Vitamin D deficiency -Started vitamin D 50,000 units p.o. weekly -Recheck vitamin D level at PCP office in 3 months  Vitamin B12 level 390, goal >400 -started vitamin B12 500 mcg p.o. daily to prevent from deficiency.   Hyponatremia -Sodium is 130 -Patient is currently on hemodialysis  Medications     acetaminophen  650 mg Oral Q6H    amLODipine  10 mg Oral q morning   ascorbic acid  500 mg Oral Daily   aspirin EC  81 mg Oral q AM   atorvastatin  40 mg Oral q AM   calcium acetate  2,001 mg Oral TID with meals   carvedilol  12.5 mg Oral BID WC   Chlorhexidine Gluconate Cloth  6 each Topical Q0600   clopidogrel  75 mg Oral Daily   vitamin B-12  500 mcg Oral Daily   darbepoetin (ARANESP) injection - DIALYSIS  200 mcg Subcutaneous Q Sat-1800   folic acid  1 mg Oral Daily   guaiFENesin  600 mg Oral BID   heparin injection (subcutaneous)  5,000 Units Subcutaneous Q8H   insulin aspart  0-9 Units Subcutaneous TID WC   insulin regular human CONCENTRATED  65 Units Subcutaneous BID WC   iron polysaccharides  150 mg Oral Daily   nortriptyline  20 mg Oral QHS   pantoprazole  40 mg Oral QAC breakfast   polyethylene glycol  17 g Oral Daily   pregabalin  75 mg Oral QHS   sertraline  100 mg Oral q AM   tamsulosin  0.4 mg Oral Daily   torsemide  120 mg Oral BID   traZODone  150 mg Oral QHS   Vitamin D (Ergocalciferol)  50,000 Units Oral Q7 days     Data Reviewed:   CBG:  Recent Labs  Lab 09/24/23 1835 09/24/23 1901 09/24/23 2132 09/24/23 2225 09/25/23 0635  GLUCAP 87 123* 68* 87 102*    SpO2: 94 % O2 Flow Rate (L/min): 3 L/min    Vitals:   09/24/23 1920 09/24/23 2338 09/25/23 0300 09/25/23 0500  BP: (!) 140/75 (!) 144/58 (!) 144/80   Pulse: 78 76 84   Resp: 14 15 20    Temp: 98.2 F (36.8 C) 97.9 F (36.6 C) 97.8 F (36.6 C)   TempSrc: Oral Oral Oral   SpO2: 95% 99% 94%   Weight:    126.3 kg  Height:          Data Reviewed:  Basic Metabolic Panel: Recent Labs  Lab 09/18/23 0959 09/19/23 0943 09/20/23 0228 09/21/23 0236 09/22/23 1200 09/23/23 0823  NA 126*  --  130* 133* 131* 130*  K 4.3  --  4.1 3.8 4.5 4.3  CL 86*  --  90* 91* 88* 91*  CO2 24  --  26 28 31 23   GLUCOSE 451*  --  137* 60* 231* 125*  BUN 83*  --  67* 77* 68* 83*  CREATININE 7.24*  --  7.04* 7.84* 7.05* 7.58*  CALCIUM 7.9*   --  8.3* 8.2* 8.8* 8.5*  MG  --  1.7 1.7  --   --   --   PHOS 6.5* 5.4* 5.9*  --   --  7.1*    CBC: Recent Labs  Lab 09/19/23 0943 09/20/23 0228 09/21/23 0236 09/22/23 1200 09/23/23 0823  WBC 5.8 6.5 5.8 5.4 7.5  HGB 8.5* 9.4* 9.0* 8.9* 8.5*  HCT 27.8* 29.6* 28.3* 28.7* 27.5*  MCV 90.0 89.7 89.3 90.5 89.3  PLT 105* 97* 105* 105* 104*    LFT Recent Labs  Lab 09/18/23 0959 09/23/23 0823  ALBUMIN 3.4* 3.8     Antibiotics: Anti-infectives (From admission, onward)    None        DVT prophylaxis: Heparin  Code Status: Full code  Family Communication: No  family at bedside   CONSULTS nephrology   Subjective   Patient very drowsy this morning, received trazodone and Xanax last night.  Complained of numbness and tingling of neck.  MRI of cervical spine showed C4-5 edema.  CT cervical spine recommended  Objective    Physical Examination:  General-appears in no acute distress Heart-S1-S2, regular, no murmur auscultated Lungs-clear to auscultation bilaterally, no wheezing or crackles auscultated Abdomen-soft, nontender, no organomegaly Extremities-no edema in the lower extremities Neuro-alert, oriented x3, no focal deficit noted  Status is: Inpatient:          Meredeth Ide   [facility] If 7PM-7AM, please contact night-coverage at [email or web address], Office  [phone]   09/25/2023, 7:56 AM  LOS: 8 days

## 2023-09-25 NOTE — Progress Notes (Signed)
  [facility] Progress Note   Subjective:   Patient seen and examined bedside earlier this am. He feels like his congestion is a little better after HD yesterday but does feel like he still has some phelgm to bring up. Otherwise no other complaints Tolerated HD yesterday with net UF 3L   Objective:   BP (!) 143/85 (BP Location: Right Leg)   Pulse 74   Temp 97.8 F (36.6 C) (Oral)   Resp 19   Ht 5\' 5"  (1.651 m)   Wt 126.3 kg   SpO2 97%   BMI 46.34 kg/m   Physical Exam: Gen: NAD, in recliner CVS: RRR Resp: Normal work of breathing, unlabored, cta b/l Abd: Obese Ext: Left AKA, no sig edema b/l Les Neuro: awake Dialysis access: LUE AVG +b/t  Assessment/ Plan:   ESRD  -outpatient HD orders: discharged from outpatient dialysis unit in Dec 2023 for behavioral issues. Dialyzes here, presents through ER on TTS. 3hrs 15 min. RUE AVG, heparin 2000 units. EDW 129kg -HD back to TTS schedule, next on 10/29 if still here  Chronic hypoxic resp failure - on 2-3 L [place] at home. Stable here on 3 lpm.   Chest pain -workup and management per primary service, has been chest pain free  Neck pain/ tingling - w/u w/ MRI yesterday, recommending CT of c-spine scheduled for today.   Volume/ hypertension  -EDW 129kg, under dry wt today 126kg. Euvolemic on exam. Chronic skin changes bilat lower legs.   Anemia of Chronic Kidney Disease -Hemoglobin 8.5. s/p Aranesp 200 mcg 10/21, will give another dose early--today -Transfuse PRN for Hgb <7  Secondary Hyperparathyroidism/Hyperphosphatemia -resume home binders -renal diet -monitor PO4-7.1, inc'ing phoslo to 3 tabs tidac. If still uncontrolled then change diet to renal diet  Vascular access -LUE AVG has been used without issues recently. Appreciate VVS's assistance--TDC removed 09/19/23  DM2 with hyperglycemia -per primary service  Thrombocytopenia -per primary service    Vinson Moselle  MD  [facility] 09/25/2023, 12:58 PM

## 2023-09-26 ENCOUNTER — Other Ambulatory Visit (HOSPITAL_COMMUNITY): Payer: Self-pay

## 2023-09-26 DIAGNOSIS — R079 Chest pain, unspecified: Secondary | ICD-10-CM | POA: Diagnosis not present

## 2023-09-26 DIAGNOSIS — N186 End stage renal disease: Secondary | ICD-10-CM | POA: Diagnosis not present

## 2023-09-26 DIAGNOSIS — E1100 Type 2 diabetes mellitus with hyperosmolarity without nonketotic hyperglycemic-hyperosmolar coma (NKHHC): Secondary | ICD-10-CM | POA: Diagnosis not present

## 2023-09-26 LAB — RENAL FUNCTION PANEL
Albumin: 3.6 g/dL (ref 3.5–5.0)
Anion gap: 14 (ref 5–15)
BUN: 98 mg/dL — ABNORMAL HIGH (ref 8–23)
CO2: 27 mmol/L (ref 22–32)
Calcium: 8.5 mg/dL — ABNORMAL LOW (ref 8.9–10.3)
Chloride: 88 mmol/L — ABNORMAL LOW (ref 98–111)
Creatinine, Ser: 7.28 mg/dL — ABNORMAL HIGH (ref 0.61–1.24)
GFR, Estimated: 8 mL/min — ABNORMAL LOW (ref >60)
Glucose, Bld: 235 mg/dL — ABNORMAL HIGH (ref 70–99)
Phosphorus: 6.1 mg/dL — ABNORMAL HIGH (ref 2.5–4.6)
Potassium: 4 mmol/L (ref 3.5–5.1)
Sodium: 129 mmol/L — ABNORMAL LOW (ref 135–145)

## 2023-09-26 LAB — GLUCOSE, CAPILLARY
Glucose-Capillary: 240 mg/dL — ABNORMAL HIGH (ref 70–99)
Glucose-Capillary: 137 mg/dL — ABNORMAL HIGH (ref 70–99)
Glucose-Capillary: 205 mg/dL — ABNORMAL HIGH (ref 70–99)
Glucose-Capillary: 206 mg/dL — ABNORMAL HIGH (ref 70–99)

## 2023-09-26 LAB — CBC
HCT: 27 % — ABNORMAL LOW (ref 39.0–52.0)
Hemoglobin: 8.2 g/dL — ABNORMAL LOW (ref 13.0–17.0)
MCH: 27.2 pg (ref 26.0–34.0)
MCHC: 30.4 g/dL (ref 30.0–36.0)
MCV: 89.7 fL (ref 80.0–100.0)
Platelets: 101 10*3/uL — ABNORMAL LOW (ref 150–400)
RBC: 3.01 MIL/uL — ABNORMAL LOW (ref 4.22–5.81)
RDW: 16.4 % — ABNORMAL HIGH (ref 11.5–15.5)
WBC: 5.5 10*3/uL (ref 4.0–10.5)
nRBC: 0.4 % — ABNORMAL HIGH (ref 0.0–0.2)

## 2023-09-26 MED ORDER — HEPARIN SODIUM (PORCINE) 1000 UNIT/ML DIALYSIS
2000.0000 [IU] | INTRAMUSCULAR | Status: DC | PRN
  Administered 2023-09-26: 2000 [IU] via INTRAVENOUS_CENTRAL
  Filled 2023-09-26: qty 2

## 2023-09-26 MED ORDER — LIDOCAINE HCL (PF) 1 % IJ SOLN: 5.0000 mL | INTRAMUSCULAR | Status: DC | PRN

## 2023-09-26 MED ORDER — HEPARIN SODIUM (PORCINE) 1000 UNIT/ML DIALYSIS: 1000.0000 [IU] | INTRAMUSCULAR | Status: DC | PRN

## 2023-09-26 MED ORDER — HEPARIN SODIUM (PORCINE) 1000 UNIT/ML DIALYSIS: 2000.0000 [IU] | Freq: Once | INTRAMUSCULAR | Status: DC

## 2023-09-26 MED ORDER — POLYSACCHARIDE IRON COMPLEX 150 MG PO CAPS
150.0000 mg | ORAL_CAPSULE | Freq: Every day | ORAL | 0 refills | Status: DC
  Filled 2023-09-26: qty 30, 30d supply, fill #0

## 2023-09-26 MED ORDER — PENTAFLUOROPROP-TETRAFLUOROETH EX AERO: 1.0000 | INHALATION_SPRAY | CUTANEOUS | Status: DC | PRN

## 2023-09-26 MED ORDER — ANTICOAGULANT SODIUM CITRATE 4% (200MG/5ML) IV SOLN: 5.0000 mL | Status: DC | PRN

## 2023-09-26 MED ORDER — LIDOCAINE-PRILOCAINE 2.5-2.5 % EX CREA: 1.0000 | TOPICAL_CREAM | CUTANEOUS | Status: DC | PRN

## 2023-09-26 MED ORDER — VITAMIN D (ERGOCALCIFEROL) 1.25 MG (50000 UNIT) PO CAPS
50000.0000 [IU] | ORAL_CAPSULE | ORAL | 0 refills | Status: DC
  Filled 2023-09-26: qty 5, 35d supply, fill #0

## 2023-09-26 NOTE — Progress Notes (Signed)
Received patient in bed to unit.  Alert and oriented.  Informed consent signed and in chart.   TX duration:3.25  Patient experienced lightheadedness and BP was low.  Dr. Sharl Ma (Internist) and Dr. Arlean Hopping informed Transported back to the room  Alert, without acute distress.  Hand-off given to patient's nurse.   Access used: Left Graft Access issues: none Total UF removed: L Medication(s) given: Oxycodone    09/26/23 1319  Vitals  Temp (!) 97.5 F (36.4 C)  Temp Source Oral  BP (!) 170/68  MAP (mmHg) 94  BP Location Right Leg  BP Method Automatic  Patient Position (if appropriate) Lying  Pulse Rate 78  ECG Heart Rate 79  Resp 12  Oxygen Therapy  SpO2 100 %  O2 Device Nasal Cannula  O2 Flow Rate (L/min) 3 L/min  During Treatment Monitoring  HD Safety Checks Performed Yes  Intra-Hemodialysis Comments Tx completed;Tolerated well  Dialysis Fluid Bolus Normal Saline  Bolus Amount (mL) 300 mL  Fistula / Graft Left Upper arm Arteriovenous vein graft  Placement Date/Time: (c) 04/22/23 (c) 1000   Orientation: Left  Access Location: Upper arm  Access Type: Arteriovenous vein graft  Site Condition No complications  Status Deaccessed     Stacie Glaze LPN [facility]

## 2023-09-26 NOTE — Progress Notes (Signed)
[name] Progress Note   Subjective:   Patient seen and examined. On HD this am. No c/o's.    Objective:   BP (!) 143/85 (BP Location: Right Leg)   Pulse 74   Temp 97.8 F (36.6 C) (Oral)   Resp 19   Ht 5\' 5"  (1.651 m)   Wt 126.3 kg   SpO2 97%   BMI 46.34 kg/m   Physical Exam: Gen: NAD, in recliner CVS: RRR Resp: Normal work of breathing, unlabored, cta b/l Abd: Obese Ext: Left AKA, no sig edema b/l Les Neuro: awake Dialysis access: LUE AVG +b/t  Assessment/ Plan:   ESRD  -outpatient HD orders: discharged from outpatient dialysis unit in Dec 2023 for behavioral issues. Dialyzes here, presents through ER on TTS. 3hrs 15 min. RUE AVG, heparin 2000 units. EDW 129kg -HD back to TTS schedule, next on 10/29 if still here  Chronic hypoxic resp failure - on 2-3 L  at home. Stable here on 3 lpm.   Chest pain -workup and management per primary service, has been chest pain free  Neck pain/ tingling - MRI showed fluid collection C4-5 disc space, NS consulted and no surgical intervention recommended. CT neck did not show fracture. Soft C-collar recommended by NSurg w/ OP f/u.   Volume/ hypertension  -EDW 129kg, under dry wt here 126kg. Euvolemic on exam. Chronic skin changes bilat lower legs.   Anemia of Chronic Kidney Disease -Hemoglobin 8.5. s/p Aranesp 200 mcg 10/21, will give another dose early--today -Transfuse PRN for Hgb <7  Secondary Hyperparathyroidism/Hyperphosphatemia -resume home binders -renal diet -monitor PO4-7.1, inc'ing phoslo to 3 tabs tidac. If still uncontrolled then change diet to renal diet  Vascular access -LUE AVG has been used without issues recently. Appreciate [facility]'s assistance--TDC removed 09/19/23  DM2 with hyperglycemia -per primary service  Thrombocytopenia -per primary service  Dispo - for d/c home today   Vinson Moselle  MD  [facility] 09/26/2023, 12:57 PM  Recent Labs  Lab 09/23/23 0823 09/26/23 0852  HGB 8.5* 8.2*   ALBUMIN 3.8 3.6  CALCIUM 8.5* 8.5*  PHOS 7.1* 6.1*  CREATININE 7.58* 7.28*  K 4.3 4.0    Inpatient medications:  acetaminophen  650 mg Oral Q6H   amLODipine  10 mg Oral q morning   ascorbic acid  500 mg Oral Daily   aspirin EC  81 mg Oral q AM   atorvastatin  40 mg Oral q AM   calcium acetate  2,001 mg Oral TID with meals   carvedilol  12.5 mg Oral BID WC   Chlorhexidine Gluconate Cloth  6 each Topical Q0600   Chlorhexidine Gluconate Cloth  6 each Topical Q0600   clopidogrel  75 mg Oral Daily   vitamin B-12  500 mcg Oral Daily   darbepoetin (ARANESP) injection - DIALYSIS  200 mcg Subcutaneous Q Sat-1800   folic acid  1 mg Oral Daily   guaiFENesin  600 mg Oral BID   [START ON 09/27/2023] heparin  2,000 Units Dialysis Once in dialysis   heparin injection (subcutaneous)  5,000 Units Subcutaneous Q8H   insulin aspart  0-9 Units Subcutaneous TID WC   insulin regular human CONCENTRATED  55 Units Subcutaneous BID WC   iron polysaccharides  150 mg Oral Daily   nortriptyline  20 mg Oral QHS   pantoprazole  40 mg Oral QAC breakfast   polyethylene glycol  17 g Oral Daily   pregabalin  75 mg Oral QHS   sertraline  100 mg Oral  q AM   tamsulosin  0.4 mg Oral Daily   torsemide  120 mg Oral BID   traZODone  150 mg Oral QHS   Vitamin D (Ergocalciferol)  50,000 Units Oral Q7 days    anticoagulant sodium citrate     acetaminophen **OR** acetaminophen, anticoagulant sodium citrate, calcium carbonate (dosed in mg elemental calcium), cyclobenzaprine, dextrose, diazepam, fentaNYL (SUBLIMAZE) injection, heparin, heparin, hydrALAZINE, lidocaine (PF), lidocaine-prilocaine, melatonin, naLOXone (NARCAN)  injection, nitroGLYCERIN, ondansetron (ZOFRAN) IV, mouth rinse, pentafluoroprop-tetrafluoroeth

## 2023-09-26 NOTE — Plan of Care (Signed)
  Problem: Education: Goal: Knowledge of General Education information will improve Description: Including pain rating scale, medication(s)/side effects and non-pharmacologic comfort measures Outcome: Progressing   Problem: Health Behavior/Discharge Planning: Goal: Ability to manage health-related needs will improve Outcome: Progressing   Problem: Clinical Measurements: Goal: Ability to maintain clinical measurements within normal limits will improve Outcome: Progressing Goal: Will remain free from infection Outcome: Progressing Goal: Diagnostic test results will improve Outcome: Progressing Goal: Respiratory complications will improve Outcome: Progressing Goal: Cardiovascular complication will be avoided Outcome: Progressing   Problem: Activity: Goal: Risk for activity intolerance will decrease Outcome: Progressing   Problem: Nutrition: Goal: Adequate nutrition will be maintained Outcome: Progressing   Problem: Coping: Goal: Level of anxiety will decrease Outcome: Progressing   Problem: Elimination: Goal: Will not experience complications related to bowel motility Outcome: Progressing Goal: Will not experience complications related to urinary retention Outcome: Progressing   Problem: Pain Managment: Goal: General experience of comfort will improve Outcome: Progressing   Problem: Safety: Goal: Ability to remain free from injury will improve Outcome: Progressing   Problem: Skin Integrity: Goal: Risk for impaired skin integrity will decrease Outcome: Progressing   Problem: Education: Goal: Ability to describe self-care measures that may prevent or decrease complications (Diabetes Survival Skills Education) will improve Outcome: Progressing Goal: Individualized Educational Video(s) Outcome: Progressing   Problem: Coping: Goal: Ability to adjust to condition or change in health will improve Outcome: Progressing   Problem: Fluid Volume: Goal: Ability to  maintain a balanced intake and output will improve Outcome: Progressing   Problem: Health Behavior/Discharge Planning: Goal: Ability to identify and utilize available resources and services will improve Outcome: Progressing Goal: Ability to manage health-related needs will improve Outcome: Progressing   Problem: Metabolic: Goal: Ability to maintain appropriate glucose levels will improve Outcome: Progressing   Problem: Nutritional: Goal: Maintenance of adequate nutrition will improve Outcome: Progressing Goal: Progress toward achieving an optimal weight will improve Outcome: Progressing   Problem: Skin Integrity: Goal: Risk for impaired skin integrity will decrease Outcome: Progressing   Problem: Tissue Perfusion: Goal: Adequacy of tissue perfusion will improve Outcome: Progressing   Problem: Education: Goal: Ability to describe self-care measures that may prevent or decrease complications (Diabetes Survival Skills Education) will improve Outcome: Progressing Goal: Individualized Educational Video(s) Outcome: Progressing   Problem: Cardiac: Goal: Ability to maintain an adequate cardiac output will improve Outcome: Progressing   Problem: Health Behavior/Discharge Planning: Goal: Ability to identify and utilize available resources and services will improve Outcome: Progressing Goal: Ability to manage health-related needs will improve Outcome: Progressing   Problem: Fluid Volume: Goal: Ability to achieve a balanced intake and output will improve Outcome: Progressing   Problem: Nutritional: Goal: Maintenance of adequate nutrition will improve Outcome: Progressing Goal: Maintenance of adequate weight for body size and type will improve Outcome: Progressing   Problem: Respiratory: Goal: Will regain and/or maintain adequate ventilation Outcome: Progressing
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
## 2023-09-26 NOTE — Plan of Care (Signed)

## 2023-09-26 NOTE — Progress Notes (Signed)
Pt system clotted

## 2023-09-26 NOTE — Discharge Summary (Signed)
Physician Discharge Summary   Patient: Jeffrey [name] MRN: [id number] DOB: [birth year]  Admit date:     09/15/2023  Discharge date: 09/26/23  Discharge Physician: Meredeth Ide   PCP: Knox Royalty, MD   Recommendations at discharge:   Follow-up PCP in 2 weeks  Discharge Diagnoses: Principal Problem:   Hyperosmolar hyperglycemic state (HHS) (HCC) Active Problems:   Chest pain  Resolved Problems:   * No resolved hospital problems. [facility] Course: 62 y.o. male with medical history significant of end-stage renal disease on hemodialysis on Tuesday, Thursdays and Saturdays, obstructive sleep apnea, COPD, chronic hypoxic respiratory failure on 3 L/min of oxygen at home diabetes mellitus type 2 on insulin, peripheral arterial disease status post left above-knee amputation and history of congestive heart failure.  Patient presented to the emergency room last night with complaints of upper extremity cramps radiating through his back.  Patient has periodic spasms but symptoms from last night was the worst he had ever experienced.  Patient apparently had missed his last session of hemodialysis on Thursday.  He also complained of chest tightness and shortness of breath which is chronic.  He denied any associated nausea, vomiting or palpitations.  In the ED, CT scan of the chest done ruled out pulm emboli or aortic dissection.  EKG was also done and essentially unrevealing for any evidence of acute ischemia.  He denies any prior history of coronary disease.  Last echo from July 2024 showed preserved left ventricular ejection fraction of 55 to 60% with no obvious wall motion abnormalities.  Mild aortic sclerosis however were noted.     Assessment and Plan:  Chest pain -Resolved -Appears atypical, likely musculoskeletal -Cardiac enzymes unremarkable, EKG unrevealing -Last echo showed preserved EF with no wall motion abnormalities -Can follow-up with cardiology as outpatient -Patient takes  aspirin and Plavix along with atorvastatin at home   Neck pain/tingling -Patient complained of neck pain yesterday, MRI cervical spine showed edema within the C4-5 intervertebral disc space -CT cervical spine recommended to rule out fracture -Neurosurgery was consulted, no surgical intervention recommended -CT cervical spine did not show fracture, plan to follow-up with neurosurgery as outpatient -Neurosurgery recommended cervical soft collar   Acute on chronic hypoxemic respiratory failure -Likely in setting of volume overload, has been dialyzed since admission -Still requiring 3 L/min of oxygen -Patient is on home oxygen 3 L/min at home    ESRD -Frequent ER visits for hemodialysis -Missed last dialysis session due to social issues -He was emergently dialyzed on presentation in the ED -Nephrology following   Diabetes mellitus type 2 -Hemoglobin A1c 11.1 -Found to be in hyperosmolar hyperglycemic state; started on IV insulin infusion which was transitioned to subcutaneous basal insulin along with sliding scale -Diabetes coordinator consulted -Currently on Humulin R 70 units twice daily along with NovoLog sliding scale -Blood glucose is well-controlled with family number 55 units subcu twice daily     Peripheral arterial disease -Complicated by left above-knee amputation -Patient is currently on aspirin and Plavix   COPD with chronic respiratory failure -Currently on 3 L/min of oxygen -Continue as needed bronchodilators -At 3 L pulmonary oxygen, at baseline    Hypertension -Blood pressure has been elevated -Continue amlodipine, carvedilol, torsemide -Started hydralazine 25 mg p.o. every 6 hours as needed for BP greater than 160/100   OSA -Uses CPAP at bedtime at home   Anemia of chronic disease -Secondary to ESRD -Continue darbepoetin as per nephrology   Iron deficiency anemia -Transferrin saturation 15% -  is unremarkable. No prevertebral effusion. Disc levels: Limited assessment for foraminal stenoses due to motion. There is moderate spinal canal stenosis at C6-7 of the spinal canal is otherwise widely patent. IMPRESSION: 1. Markedly motion degraded examination. 2. Edema within the C4-5 intervertebral space. In the root context of recent trauma, this could indicate a fracture through the disc space. CT of the cervical spine recommended. 3. Moderate C6-7 spinal canal stenosis. 4. C3-6 laminectomies with posterior fusion. Electronically Signed   By: Deatra Robinson M.D.   On: 09/25/2023 01:15   ECHOCARDIOGRAM  COMPLETE  Result Date: 09/17/2023    ECHOCARDIOGRAM REPORT   Patient Name:   Jeffrey [name] Date of Exam: 09/17/2023 Medical Rec #:  [id number]     Height:       65.0 in Accession #:    [id number]    Weight:       282.8 lb Date of Birth:  06/23/1961     BSA:          2.292 m Patient Age:    62 years      BP:           140/44 mmHg Patient Gender: M             HR:           85 bpm. Exam Location:  Inpatient Procedure: 2D Echo, Color Doppler, Cardiac Doppler and Intracardiac            Opacification Agent Indications:    R07.9* Chest pain, unspecified  History:        Patient has prior history of Echocardiogram examinations, most                 recent 06/01/2023. CHF, COPD; Risk Factors:Hypertension, Diabetes,                 Dyslipidemia and Sleep Apnea.  Sonographer:    Irving Burton Senior RDCS Referring Phys: [id number] Angie Fava  Sonographer Comments: Technically difficult due to patient body habitus. IMPRESSIONS  1. Left ventricular ejection fraction, by estimation, is 60 to 65%. The left ventricle has normal function. The left ventricle has no regional wall motion abnormalities. Left ventricular diastolic parameters are consistent with Grade I diastolic dysfunction (impaired relaxation).  2. Right ventricular systolic function was not well visualized. The right ventricular size is not well visualized. Tricuspid regurgitation signal is inadequate for assessing PA pressure.  3. Left atrial size was mild to moderately dilated.  4. The mitral valve is normal in structure. No evidence of mitral valve regurgitation. No evidence of mitral stenosis.  5. The aortic valve has an indeterminant number of cusps. There is moderate calcification of the aortic valve. There is moderate thickening of the aortic valve. Aortic valve regurgitation is not visualized. Aortic valve sclerosis/calcification is present, without any evidence of aortic stenosis.  6. The inferior vena cava is normal in size with greater than 50% respiratory  variability, suggesting right atrial pressure of 3 mmHg. Comparison(s): No prior Echocardiogram. FINDINGS  Left Ventricle: Left ventricular ejection fraction, by estimation, is 60 to 65%. The left ventricle has normal function. The left ventricle has no regional wall motion abnormalities. Definity contrast agent was given IV to delineate the left ventricular  endocardial borders. The left ventricular internal cavity size was normal in size. There is no left ventricular hypertrophy. Left ventricular diastolic parameters are consistent with Grade I diastolic dysfunction (impaired relaxation). Normal left ventricular filling pressure. Right Ventricle: The right ventricular size  is unremarkable. No prevertebral effusion. Disc levels: Limited assessment for foraminal stenoses due to motion. There is moderate spinal canal stenosis at C6-7 of the spinal canal is otherwise widely patent. IMPRESSION: 1. Markedly motion degraded examination. 2. Edema within the C4-5 intervertebral space. In the root context of recent trauma, this could indicate a fracture through the disc space. CT of the cervical spine recommended. 3. Moderate C6-7 spinal canal stenosis. 4. C3-6 laminectomies with posterior fusion. Electronically Signed   By: Deatra Robinson M.D.   On: 09/25/2023 01:15   ECHOCARDIOGRAM  COMPLETE  Result Date: 09/17/2023    ECHOCARDIOGRAM REPORT   Patient Name:   Jeffrey [name] Date of Exam: 09/17/2023 Medical Rec #:  [id number]     Height:       65.0 in Accession #:    [id number]    Weight:       282.8 lb Date of Birth:  06/23/1961     BSA:          2.292 m Patient Age:    62 years      BP:           140/44 mmHg Patient Gender: M             HR:           85 bpm. Exam Location:  Inpatient Procedure: 2D Echo, Color Doppler, Cardiac Doppler and Intracardiac            Opacification Agent Indications:    R07.9* Chest pain, unspecified  History:        Patient has prior history of Echocardiogram examinations, most                 recent 06/01/2023. CHF, COPD; Risk Factors:Hypertension, Diabetes,                 Dyslipidemia and Sleep Apnea.  Sonographer:    Irving Burton Senior RDCS Referring Phys: [id number] Angie Fava  Sonographer Comments: Technically difficult due to patient body habitus. IMPRESSIONS  1. Left ventricular ejection fraction, by estimation, is 60 to 65%. The left ventricle has normal function. The left ventricle has no regional wall motion abnormalities. Left ventricular diastolic parameters are consistent with Grade I diastolic dysfunction (impaired relaxation).  2. Right ventricular systolic function was not well visualized. The right ventricular size is not well visualized. Tricuspid regurgitation signal is inadequate for assessing PA pressure.  3. Left atrial size was mild to moderately dilated.  4. The mitral valve is normal in structure. No evidence of mitral valve regurgitation. No evidence of mitral stenosis.  5. The aortic valve has an indeterminant number of cusps. There is moderate calcification of the aortic valve. There is moderate thickening of the aortic valve. Aortic valve regurgitation is not visualized. Aortic valve sclerosis/calcification is present, without any evidence of aortic stenosis.  6. The inferior vena cava is normal in size with greater than 50% respiratory  variability, suggesting right atrial pressure of 3 mmHg. Comparison(s): No prior Echocardiogram. FINDINGS  Left Ventricle: Left ventricular ejection fraction, by estimation, is 60 to 65%. The left ventricle has normal function. The left ventricle has no regional wall motion abnormalities. Definity contrast agent was given IV to delineate the left ventricular  endocardial borders. The left ventricular internal cavity size was normal in size. There is no left ventricular hypertrophy. Left ventricular diastolic parameters are consistent with Grade I diastolic dysfunction (impaired relaxation). Normal left ventricular filling pressure. Right Ventricle: The right ventricular size  is unremarkable. No prevertebral effusion. Disc levels: Limited assessment for foraminal stenoses due to motion. There is moderate spinal canal stenosis at C6-7 of the spinal canal is otherwise widely patent. IMPRESSION: 1. Markedly motion degraded examination. 2. Edema within the C4-5 intervertebral space. In the root context of recent trauma, this could indicate a fracture through the disc space. CT of the cervical spine recommended. 3. Moderate C6-7 spinal canal stenosis. 4. C3-6 laminectomies with posterior fusion. Electronically Signed   By: Deatra Robinson M.D.   On: 09/25/2023 01:15   ECHOCARDIOGRAM  COMPLETE  Result Date: 09/17/2023    ECHOCARDIOGRAM REPORT   Patient Name:   Jeffrey [name] Date of Exam: 09/17/2023 Medical Rec #:  [id number]     Height:       65.0 in Accession #:    [id number]    Weight:       282.8 lb Date of Birth:  06/23/1961     BSA:          2.292 m Patient Age:    62 years      BP:           140/44 mmHg Patient Gender: M             HR:           85 bpm. Exam Location:  Inpatient Procedure: 2D Echo, Color Doppler, Cardiac Doppler and Intracardiac            Opacification Agent Indications:    R07.9* Chest pain, unspecified  History:        Patient has prior history of Echocardiogram examinations, most                 recent 06/01/2023. CHF, COPD; Risk Factors:Hypertension, Diabetes,                 Dyslipidemia and Sleep Apnea.  Sonographer:    Irving Burton Senior RDCS Referring Phys: [id number] Angie Fava  Sonographer Comments: Technically difficult due to patient body habitus. IMPRESSIONS  1. Left ventricular ejection fraction, by estimation, is 60 to 65%. The left ventricle has normal function. The left ventricle has no regional wall motion abnormalities. Left ventricular diastolic parameters are consistent with Grade I diastolic dysfunction (impaired relaxation).  2. Right ventricular systolic function was not well visualized. The right ventricular size is not well visualized. Tricuspid regurgitation signal is inadequate for assessing PA pressure.  3. Left atrial size was mild to moderately dilated.  4. The mitral valve is normal in structure. No evidence of mitral valve regurgitation. No evidence of mitral stenosis.  5. The aortic valve has an indeterminant number of cusps. There is moderate calcification of the aortic valve. There is moderate thickening of the aortic valve. Aortic valve regurgitation is not visualized. Aortic valve sclerosis/calcification is present, without any evidence of aortic stenosis.  6. The inferior vena cava is normal in size with greater than 50% respiratory  variability, suggesting right atrial pressure of 3 mmHg. Comparison(s): No prior Echocardiogram. FINDINGS  Left Ventricle: Left ventricular ejection fraction, by estimation, is 60 to 65%. The left ventricle has normal function. The left ventricle has no regional wall motion abnormalities. Definity contrast agent was given IV to delineate the left ventricular  endocardial borders. The left ventricular internal cavity size was normal in size. There is no left ventricular hypertrophy. Left ventricular diastolic parameters are consistent with Grade I diastolic dysfunction (impaired relaxation). Normal left ventricular filling pressure. Right Ventricle: The right ventricular size  is unremarkable. No prevertebral effusion. Disc levels: Limited assessment for foraminal stenoses due to motion. There is moderate spinal canal stenosis at C6-7 of the spinal canal is otherwise widely patent. IMPRESSION: 1. Markedly motion degraded examination. 2. Edema within the C4-5 intervertebral space. In the root context of recent trauma, this could indicate a fracture through the disc space. CT of the cervical spine recommended. 3. Moderate C6-7 spinal canal stenosis. 4. C3-6 laminectomies with posterior fusion. Electronically Signed   By: Deatra Robinson M.D.   On: 09/25/2023 01:15   ECHOCARDIOGRAM  COMPLETE  Result Date: 09/17/2023    ECHOCARDIOGRAM REPORT   Patient Name:   Jeffrey [name] Date of Exam: 09/17/2023 Medical Rec #:  [id number]     Height:       65.0 in Accession #:    [id number]    Weight:       282.8 lb Date of Birth:  06/23/1961     BSA:          2.292 m Patient Age:    62 years      BP:           140/44 mmHg Patient Gender: M             HR:           85 bpm. Exam Location:  Inpatient Procedure: 2D Echo, Color Doppler, Cardiac Doppler and Intracardiac            Opacification Agent Indications:    R07.9* Chest pain, unspecified  History:        Patient has prior history of Echocardiogram examinations, most                 recent 06/01/2023. CHF, COPD; Risk Factors:Hypertension, Diabetes,                 Dyslipidemia and Sleep Apnea.  Sonographer:    Irving Burton Senior RDCS Referring Phys: [id number] Angie Fava  Sonographer Comments: Technically difficult due to patient body habitus. IMPRESSIONS  1. Left ventricular ejection fraction, by estimation, is 60 to 65%. The left ventricle has normal function. The left ventricle has no regional wall motion abnormalities. Left ventricular diastolic parameters are consistent with Grade I diastolic dysfunction (impaired relaxation).  2. Right ventricular systolic function was not well visualized. The right ventricular size is not well visualized. Tricuspid regurgitation signal is inadequate for assessing PA pressure.  3. Left atrial size was mild to moderately dilated.  4. The mitral valve is normal in structure. No evidence of mitral valve regurgitation. No evidence of mitral stenosis.  5. The aortic valve has an indeterminant number of cusps. There is moderate calcification of the aortic valve. There is moderate thickening of the aortic valve. Aortic valve regurgitation is not visualized. Aortic valve sclerosis/calcification is present, without any evidence of aortic stenosis.  6. The inferior vena cava is normal in size with greater than 50% respiratory  variability, suggesting right atrial pressure of 3 mmHg. Comparison(s): No prior Echocardiogram. FINDINGS  Left Ventricle: Left ventricular ejection fraction, by estimation, is 60 to 65%. The left ventricle has normal function. The left ventricle has no regional wall motion abnormalities. Definity contrast agent was given IV to delineate the left ventricular  endocardial borders. The left ventricular internal cavity size was normal in size. There is no left ventricular hypertrophy. Left ventricular diastolic parameters are consistent with Grade I diastolic dysfunction (impaired relaxation). Normal left ventricular filling pressure. Right Ventricle: The right ventricular size  Physician Discharge Summary   Patient: Jeffrey [name] MRN: [id number] DOB: [birth year]  Admit date:     09/15/2023  Discharge date: 09/26/23  Discharge Physician: Meredeth Ide   PCP: Knox Royalty, MD   Recommendations at discharge:   Follow-up PCP in 2 weeks  Discharge Diagnoses: Principal Problem:   Hyperosmolar hyperglycemic state (HHS) (HCC) Active Problems:   Chest pain  Resolved Problems:   * No resolved hospital problems. [facility] Course: 62 y.o. male with medical history significant of end-stage renal disease on hemodialysis on Tuesday, Thursdays and Saturdays, obstructive sleep apnea, COPD, chronic hypoxic respiratory failure on 3 L/min of oxygen at home diabetes mellitus type 2 on insulin, peripheral arterial disease status post left above-knee amputation and history of congestive heart failure.  Patient presented to the emergency room last night with complaints of upper extremity cramps radiating through his back.  Patient has periodic spasms but symptoms from last night was the worst he had ever experienced.  Patient apparently had missed his last session of hemodialysis on Thursday.  He also complained of chest tightness and shortness of breath which is chronic.  He denied any associated nausea, vomiting or palpitations.  In the ED, CT scan of the chest done ruled out pulm emboli or aortic dissection.  EKG was also done and essentially unrevealing for any evidence of acute ischemia.  He denies any prior history of coronary disease.  Last echo from July 2024 showed preserved left ventricular ejection fraction of 55 to 60% with no obvious wall motion abnormalities.  Mild aortic sclerosis however were noted.     Assessment and Plan:  Chest pain -Resolved -Appears atypical, likely musculoskeletal -Cardiac enzymes unremarkable, EKG unrevealing -Last echo showed preserved EF with no wall motion abnormalities -Can follow-up with cardiology as outpatient -Patient takes  aspirin and Plavix along with atorvastatin at home   Neck pain/tingling -Patient complained of neck pain yesterday, MRI cervical spine showed edema within the C4-5 intervertebral disc space -CT cervical spine recommended to rule out fracture -Neurosurgery was consulted, no surgical intervention recommended -CT cervical spine did not show fracture, plan to follow-up with neurosurgery as outpatient -Neurosurgery recommended cervical soft collar   Acute on chronic hypoxemic respiratory failure -Likely in setting of volume overload, has been dialyzed since admission -Still requiring 3 L/min of oxygen -Patient is on home oxygen 3 L/min at home    ESRD -Frequent ER visits for hemodialysis -Missed last dialysis session due to social issues -He was emergently dialyzed on presentation in the ED -Nephrology following   Diabetes mellitus type 2 -Hemoglobin A1c 11.1 -Found to be in hyperosmolar hyperglycemic state; started on IV insulin infusion which was transitioned to subcutaneous basal insulin along with sliding scale -Diabetes coordinator consulted -Currently on Humulin R 70 units twice daily along with NovoLog sliding scale -Blood glucose is well-controlled with family number 55 units subcu twice daily     Peripheral arterial disease -Complicated by left above-knee amputation -Patient is currently on aspirin and Plavix   COPD with chronic respiratory failure -Currently on 3 L/min of oxygen -Continue as needed bronchodilators -At 3 L pulmonary oxygen, at baseline    Hypertension -Blood pressure has been elevated -Continue amlodipine, carvedilol, torsemide -Started hydralazine 25 mg p.o. every 6 hours as needed for BP greater than 160/100   OSA -Uses CPAP at bedtime at home   Anemia of chronic disease -Secondary to ESRD -Continue darbepoetin as per nephrology   Iron deficiency anemia -Transferrin saturation 15% -  Physician Discharge Summary   Patient: Jeffrey [name] MRN: [id number] DOB: [birth year]  Admit date:     09/15/2023  Discharge date: 09/26/23  Discharge Physician: Meredeth Ide   PCP: Knox Royalty, MD   Recommendations at discharge:   Follow-up PCP in 2 weeks  Discharge Diagnoses: Principal Problem:   Hyperosmolar hyperglycemic state (HHS) (HCC) Active Problems:   Chest pain  Resolved Problems:   * No resolved hospital problems. [facility] Course: 62 y.o. male with medical history significant of end-stage renal disease on hemodialysis on Tuesday, Thursdays and Saturdays, obstructive sleep apnea, COPD, chronic hypoxic respiratory failure on 3 L/min of oxygen at home diabetes mellitus type 2 on insulin, peripheral arterial disease status post left above-knee amputation and history of congestive heart failure.  Patient presented to the emergency room last night with complaints of upper extremity cramps radiating through his back.  Patient has periodic spasms but symptoms from last night was the worst he had ever experienced.  Patient apparently had missed his last session of hemodialysis on Thursday.  He also complained of chest tightness and shortness of breath which is chronic.  He denied any associated nausea, vomiting or palpitations.  In the ED, CT scan of the chest done ruled out pulm emboli or aortic dissection.  EKG was also done and essentially unrevealing for any evidence of acute ischemia.  He denies any prior history of coronary disease.  Last echo from July 2024 showed preserved left ventricular ejection fraction of 55 to 60% with no obvious wall motion abnormalities.  Mild aortic sclerosis however were noted.     Assessment and Plan:  Chest pain -Resolved -Appears atypical, likely musculoskeletal -Cardiac enzymes unremarkable, EKG unrevealing -Last echo showed preserved EF with no wall motion abnormalities -Can follow-up with cardiology as outpatient -Patient takes  aspirin and Plavix along with atorvastatin at home   Neck pain/tingling -Patient complained of neck pain yesterday, MRI cervical spine showed edema within the C4-5 intervertebral disc space -CT cervical spine recommended to rule out fracture -Neurosurgery was consulted, no surgical intervention recommended -CT cervical spine did not show fracture, plan to follow-up with neurosurgery as outpatient -Neurosurgery recommended cervical soft collar   Acute on chronic hypoxemic respiratory failure -Likely in setting of volume overload, has been dialyzed since admission -Still requiring 3 L/min of oxygen -Patient is on home oxygen 3 L/min at home    ESRD -Frequent ER visits for hemodialysis -Missed last dialysis session due to social issues -He was emergently dialyzed on presentation in the ED -Nephrology following   Diabetes mellitus type 2 -Hemoglobin A1c 11.1 -Found to be in hyperosmolar hyperglycemic state; started on IV insulin infusion which was transitioned to subcutaneous basal insulin along with sliding scale -Diabetes coordinator consulted -Currently on Humulin R 70 units twice daily along with NovoLog sliding scale -Blood glucose is well-controlled with family number 55 units subcu twice daily     Peripheral arterial disease -Complicated by left above-knee amputation -Patient is currently on aspirin and Plavix   COPD with chronic respiratory failure -Currently on 3 L/min of oxygen -Continue as needed bronchodilators -At 3 L pulmonary oxygen, at baseline    Hypertension -Blood pressure has been elevated -Continue amlodipine, carvedilol, torsemide -Started hydralazine 25 mg p.o. 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

## 2023-09-27 ENCOUNTER — Other Ambulatory Visit (HOSPITAL_COMMUNITY): Payer: Self-pay

## 2023-09-27 DIAGNOSIS — E1100 Type 2 diabetes mellitus with hyperosmolarity without nonketotic hyperglycemic-hyperosmolar coma (NKHHC): Secondary | ICD-10-CM | POA: Diagnosis not present

## 2023-09-27 LAB — GLUCOSE, CAPILLARY
Glucose-Capillary: 151 mg/dL — ABNORMAL HIGH (ref 70–99)
Glucose-Capillary: 216 mg/dL — ABNORMAL HIGH (ref 70–99)

## 2023-09-27 MED ORDER — HUMALOG KWIKPEN 100 UNIT/ML ~~LOC~~ SOPN
0.0000 [IU] | PEN_INJECTOR | Freq: Three times a day (TID) | SUBCUTANEOUS | 1 refills | Status: DC
  Filled 2023-09-27: qty 15, 56d supply, fill #0

## 2023-09-27 MED ORDER — OXYCODONE HCL 5 MG PO TABS
2.5000 mg | ORAL_TABLET | ORAL | Status: DC | PRN
  Administered 2023-09-27: 2.5 mg via ORAL
  Filled 2023-09-27: qty 1

## 2023-09-27 MED ORDER — OXYCODONE HCL 5 MG PO TABS
5.0000 mg | ORAL_TABLET | Freq: Three times a day (TID) | ORAL | 0 refills | Status: AC | PRN
  Filled 2023-09-27: qty 9, 3d supply, fill #0

## 2023-09-27 MED ORDER — HUMULIN R U-500 KWIKPEN 500 UNIT/ML ~~LOC~~ SOPN: 55.0000 [IU] | PEN_INJECTOR | Freq: Two times a day (BID) | SUBCUTANEOUS | Status: AC

## 2023-09-27 NOTE — Care Management Important Message (Signed)
Important Message  Patient Details  Name: Jeffrey [name] MRN: [id number] Date of Birth: [birth year]   Important Message Given:  Yes - Medicare IM     Dorena Bodo 09/27/2023, 3:09 PM

## 2023-09-27 NOTE — Progress Notes (Signed)
AVS gone over with patient with all questions answered, iv removed, patient belongings/TOC meds given and patient was wheeled out to daughters' vehicle.
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
## 2023-09-27 NOTE — Progress Notes (Signed)
[facility] Progress Note   Subjective:   Patient seen and examined. In room. Soft collar on neck. Going home today.    Objective:   BP (!) 143/85 (BP Location: Right Leg)   Pulse 74   Temp 97.8 F (36.6 C) (Oral)   Resp 19   Ht 5\' 5"  (1.651 m)   Wt 126.3 kg   SpO2 97%   BMI 46.34 kg/m   Physical Exam: Gen: NAD, in recliner CVS: RRR Resp: Normal work of breathing, unlabored, cta b/l Abd: Obese Ext: Left AKA, no sig edema b/l Les Neuro: awake Dialysis access: LUE AVG +b/t  Assessment/ Plan:   ESRD  -outpatient HD orders: discharged from outpatient dialysis unit in Dec 2023 for behavioral issues. Dialyzes here, presents through ER on TTS. 3hrs 15 min. RUE AVG, heparin 2000 units. EDW 129kg -HD back to TTS schedule, next on 10/29 if still here  Chronic hypoxic resp failure - on 2-3 L [place] at home. Stable here on 3 lpm.   Chest pain -workup and management per primary service, has been chest pain free  Neck pain/ tingling - MRI showed fluid collection C4-5 disc space, NS consulted and no surgical intervention recommended. CT neck did not show fracture. Soft C-collar recommended by NSurg w/ OP f/u.   Volume/ hypertension  -EDW 129kg, under dry wt here 126kg. Euvolemic on exam. Chronic skin changes bilat lower legs.   Anemia of Chronic Kidney Disease -Hemoglobin 8.5. s/p Aranesp 200 mcg 10/21, will give another dose early--today -Transfuse PRN for Hgb <7  Secondary Hyperparathyroidism/Hyperphosphatemia -resume home binders -renal diet -monitor PO4-7.1, inc'ing phoslo to 3 tabs tidac. If still uncontrolled then change diet to renal diet  Vascular access -LUE AVG has been used without issues recently. Appreciate VVS's assistance--TDC removed 09/19/23  DM2 with hyperglycemia -per primary service  Thrombocytopenia -per primary service  Dispo - for d/c home today   Vinson Moselle  MD  [facility] 09/27/2023, 1:57 PM  Recent Labs  Lab 09/23/23 0823  09/26/23 0852  HGB 8.5* 8.2*  ALBUMIN 3.8 3.6  CALCIUM 8.5* 8.5*  PHOS 7.1* 6.1*  CREATININE 7.58* 7.28*  K 4.3 4.0    Inpatient medications:  acetaminophen  650 mg Oral Q6H   amLODipine  10 mg Oral q morning   ascorbic acid  500 mg Oral Daily   aspirin EC  81 mg Oral q AM   atorvastatin  40 mg Oral q AM   calcium acetate  2,001 mg Oral TID with meals   carvedilol  12.5 mg Oral BID WC   Chlorhexidine Gluconate Cloth  6 each Topical Q0600   Chlorhexidine Gluconate Cloth  6 each Topical Q0600   clopidogrel  75 mg Oral Daily   vitamin B-12  500 mcg Oral Daily   darbepoetin (ARANESP) injection - DIALYSIS  200 mcg Subcutaneous Q Sat-1800   folic acid  1 mg Oral Daily   guaiFENesin  600 mg Oral BID   heparin injection (subcutaneous)  5,000 Units Subcutaneous Q8H   insulin aspart  0-9 Units Subcutaneous TID WC   insulin regular human CONCENTRATED  55 Units Subcutaneous BID WC   iron polysaccharides  150 mg Oral Daily   nortriptyline  20 mg Oral QHS   pantoprazole  40 mg Oral QAC breakfast   polyethylene glycol  17 g Oral Daily   pregabalin  75 mg Oral QHS   sertraline  100 mg Oral q AM   tamsulosin  0.4 mg Oral Daily  torsemide  120 mg Oral BID   traZODone  150 mg Oral QHS   Vitamin D (Ergocalciferol)  50,000 Units Oral Q7 days     acetaminophen **OR** acetaminophen, calcium carbonate (dosed in mg elemental calcium), cyclobenzaprine, dextrose, diazepam, hydrALAZINE, melatonin, naLOXone (NARCAN)  injection, nitroGLYCERIN, ondansetron (ZOFRAN) IV, mouth rinse, oxyCODONE

## 2023-09-27 NOTE — Plan of Care (Signed)
Problem: Education: Goal: Knowledge of General Education information will improve Description: Including pain rating scale, medication(s)/side effects and non-pharmacologic comfort measures Outcome: Adequate for Discharge   Problem: Health Behavior/Discharge Planning: Goal: Ability to manage health-related needs will improve Outcome: Adequate for Discharge   Problem: Clinical Measurements: Goal: Ability to maintain clinical measurements within normal limits will improve Outcome: Adequate for Discharge Goal: Will remain free from infection Outcome: Adequate for Discharge Goal: Diagnostic test results will improve Outcome: Adequate for Discharge Goal: Respiratory complications will improve Outcome: Adequate for Discharge Goal: Cardiovascular complication will be avoided Outcome: Adequate for Discharge   Problem: Activity: Goal: Risk for activity intolerance will decrease Outcome: Adequate for Discharge   Problem: Nutrition: Goal: Adequate nutrition will be maintained Outcome: Adequate for Discharge   Problem: Coping: Goal: Level of anxiety will decrease Outcome: Adequate for Discharge   Problem: Elimination: Goal: Will not experience complications related to bowel motility Outcome: Adequate for Discharge Goal: Will not experience complications related to urinary retention Outcome: Adequate for Discharge   Problem: Pain Managment: Goal: General experience of comfort will improve Outcome: Adequate for Discharge   Problem: Safety: Goal: Ability to remain free from injury will improve Outcome: Adequate for Discharge   Problem: Skin Integrity: Goal: Risk for impaired skin integrity will decrease Outcome: Adequate for Discharge   Problem: Education: Goal: Ability to describe self-care measures that may prevent or decrease complications (Diabetes Survival Skills Education) will improve Outcome: Adequate for Discharge Goal: Individualized Educational Video(s) Outcome:  Adequate for Discharge   Problem: Coping: Goal: Ability to adjust to condition or change in health will improve Outcome: Adequate for Discharge   Problem: Fluid Volume: Goal: Ability to maintain a balanced intake and output will improve Outcome: Adequate for Discharge   Problem: Health Behavior/Discharge Planning: Goal: Ability to identify and utilize available resources and services will improve Outcome: Adequate for Discharge Goal: Ability to manage health-related needs will improve Outcome: Adequate for Discharge   Problem: Metabolic: Goal: Ability to maintain appropriate glucose levels will improve Outcome: Adequate for Discharge   Problem: Nutritional: Goal: Maintenance of adequate nutrition will improve Outcome: Adequate for Discharge Goal: Progress toward achieving an optimal weight will improve Outcome: Adequate for Discharge   Problem: Skin Integrity: Goal: Risk for impaired skin integrity will decrease Outcome: Adequate for Discharge   Problem: Tissue Perfusion: Goal: Adequacy of tissue perfusion will improve Outcome: Adequate for Discharge   Problem: Education: Goal: Ability to describe self-care measures that may prevent or decrease complications (Diabetes Survival Skills Education) will improve Outcome: Adequate for Discharge Goal: Individualized Educational Video(s) Outcome: Adequate for Discharge   Problem: Cardiac: Goal: Ability to maintain an adequate cardiac output will improve Outcome: Adequate for Discharge   Problem: Health Behavior/Discharge Planning: Goal: Ability to identify and utilize available resources and services will improve Outcome: Adequate for Discharge Goal: Ability to manage health-related needs will improve Outcome: Adequate for Discharge   Problem: Fluid Volume: Goal: Ability to achieve a balanced intake and output will improve Outcome: Adequate for Discharge   Problem: Metabolic: Goal: Ability to maintain appropriate  glucose levels will improve Outcome: Adequate for Discharge   Problem: Nutritional: Goal: Maintenance of adequate nutrition will improve Outcome: Adequate for Discharge Goal: Maintenance of adequate weight for body size and type will improve Outcome: Adequate for Discharge   Problem: Respiratory: Goal: Will regain and/or maintain adequate ventilation Outcome: Adequate for Discharge   Problem: Urinary Elimination: Goal: Ability to achieve and maintain adequate renal perfusion and functioning will improve  Outcome: Adequate for Discharge   Problem: Acute Rehab PT Goals(only PT should resolve) Goal: Pt Will Transfer Bed To Chair/Chair To Bed Outcome: Adequate for Discharge Goal: Pt/caregiver will Perform Home Exercise Program Outcome: Adequate for Discharge

## 2023-09-27 NOTE — Final Progress Note (Signed)
Discharge instructions (including medications) discussed with and copy provided to patient/caregiver 

## 2023-09-27 NOTE — Progress Notes (Signed)
Orthopedic Tech Progress Note Patient Details:  Jeffrey [name] 08-16-1961 [id number]  Ortho Devices Type of Ortho Device: Soft collar Ortho Device/Splint Interventions: Ordered, Application, Adjustment   Post Interventions Patient Tolerated: Well Instructions Provided: Care of device, Adjustment of device  Trinna Post 09/27/2023, 9:28 AM

## 2023-09-28 ENCOUNTER — Encounter (HOSPITAL_COMMUNITY): Payer: Self-pay

## 2023-09-28 ENCOUNTER — Emergency Department (HOSPITAL_COMMUNITY)
Admission: EM | Admit: 2023-09-28 | Discharge: 2023-09-28 | Disposition: A | Discharge status: Home / Self Care | Payer: 59 | Attending: Emergency Medicine | Admitting: Emergency Medicine

## 2023-09-28 ENCOUNTER — Other Ambulatory Visit: Payer: Self-pay

## 2023-09-28 DIAGNOSIS — N186 End stage renal disease: Secondary | ICD-10-CM | POA: Insufficient documentation

## 2023-09-28 DIAGNOSIS — I120 Hypertensive chronic kidney disease with stage 5 chronic kidney disease or end stage renal disease: Secondary | ICD-10-CM | POA: Insufficient documentation

## 2023-09-28 DIAGNOSIS — Z794 Long term (current) use of insulin: Secondary | ICD-10-CM | POA: Insufficient documentation

## 2023-09-28 DIAGNOSIS — J449 Chronic obstructive pulmonary disease, unspecified: Secondary | ICD-10-CM | POA: Insufficient documentation

## 2023-09-28 DIAGNOSIS — Z79899 Other long term (current) drug therapy: Secondary | ICD-10-CM | POA: Insufficient documentation

## 2023-09-28 DIAGNOSIS — Z7951 Long term (current) use of inhaled steroids: Secondary | ICD-10-CM | POA: Diagnosis not present

## 2023-09-28 DIAGNOSIS — E1122 Type 2 diabetes mellitus with diabetic chronic kidney disease: Secondary | ICD-10-CM | POA: Insufficient documentation

## 2023-09-28 DIAGNOSIS — Z7982 Long term (current) use of aspirin: Secondary | ICD-10-CM | POA: Diagnosis not present

## 2023-09-28 DIAGNOSIS — Z992 Dependence on renal dialysis: Secondary | ICD-10-CM | POA: Diagnosis present

## 2023-09-28 LAB — CBG MONITORING, ED: Glucose-Capillary: 280 mg/dL — ABNORMAL HIGH (ref 70–99)

## 2023-09-28 MED ORDER — DARBEPOETIN ALFA 200 MCG/0.4ML IJ SOSY
200.0000 ug | PREFILLED_SYRINGE | Freq: Once | INTRAMUSCULAR | Status: DC
  Filled 2023-09-28: qty 0.4

## 2023-09-28 MED ORDER — CHLORHEXIDINE GLUCONATE CLOTH 2 % EX PADS: 6.0000 | MEDICATED_PAD | Freq: Every day | CUTANEOUS | Status: DC

## 2023-09-28 NOTE — ED Triage Notes (Signed)
Pt states he is for dialysis. No issues or complaints.

## 2023-09-28 NOTE — Progress Notes (Signed)
Received patient in bed.Awake,alert and oriented x 3. He signed his consent.  Access used: Left upper arm AVG that worked well.  Duration of treatment : 3.15 hours.  UF goal :Met-2.5 liters  Hemo comment :Tolerated treatment.  Hand off to the patient's nurse:Back to ED hallway 9 with a stable medical condition via transporter.

## 2023-09-28 NOTE — Progress Notes (Addendum)
We were asked to see this patient for dialysis. Pt was discharged from his OP HD unit in December 2023 due to behavioral issues. He no longer has a home outpatient HD unit. Pt came to ED today requesting hospital dialysis. The plan will be for "ED HD". Patient is not being admitted. Pt will go to the dialysis unit upstairs when they are ready for the pt. When dialysis is completed pt will be sent back to ED for reassessment.      Last OP HD orders (dec 2023):  3:15h  129kg  RUE AVG   Heparin 2000     - last hep B labs done here on --> 09/16/23   - pt typically comes on Tu, Th and Sat for hospital HD   Problems: Anemia of esrd:  Pt had darbe in late April and in early May, but Hb continued to drop, so dosing of darbepoeitin was increased up to 100 and then up to 150 mcg q 1-2 wks.  Pt also required two 1 gm IV Fe loading programs during the summer and early fall to get the Hb to respond. In Sept / Oct 2024, Hb has been dropping down back into the 8's. Plan: Cont darbepoetin w/ ^ to 200 mcg every 1-2 wks in hospital (pt does not have outpatient unit to get this medication). Last dose was on 10/20 --> will repeat every 1-2 wks approximately w/ 200 mcg sq today.  Darbepoeitin needs to ordered for this patient as "once in dialysis" and not "SQ at 1800" because he needs to be ready to leave at 5 pm to catch his ride home.    HD access: new LUE AVG placed 04/07/23 by Dr Edilia Bo. Using AVG now.    Date             Hb         Tsat/ ferritin    Darbe SQ       ferric gluconate 8/10                  10.3                          150 mcg 8/31                   9.8 9/03                                                     150 mcg  9/14                    9.9      9/17                    9.7          9/24                     8.8                            10/3                     8.4  150 mcg    10/12                                                      200 mcg  10/20          200 mcg 10/29       8.2        10/31            200 mcg ordered sq  I have reviewed the HD regimen and made appropriate changes.  Vinson Moselle MD  [facility] 09/28/2023, 1:08 PM

## 2023-09-28 NOTE — ED Provider Notes (Signed)
Pt has finished dialysis and is ready for d/c.  Pt said he feels well.  He is stable for d/c.  Return if worse.   Jacalyn Lefevre, MD 09/28/23 1630

## 2023-09-28 NOTE — ED Provider Notes (Signed)
[place] EMERGENCY DEPARTMENT AT [facility] Provider Note   CSN: [id number] Arrival date & time: 09/28/23  7829     History Chief Complaint  Patient presents with   Vascular Access Problem    Jeffrey [name] is a 62 y.o. male with h/o ESRD on TuThSat dialysis, COPD on 3L NS, DM2 presents to the ER for his dialysis. He reports his last session was on Tuesday. He denies any chest pain or SOB different from his baseline.  The patient is well known to the ER for getting dialysis as he was discharged from his previous center because of behavioral issues.   HPI     Home Medications Prior to Admission medications   Medication Sig Start Date End Date Taking? Authorizing Provider  acetaminophen (TYLENOL) 325 MG tablet Take 2 tablets (650 mg total) by mouth every 6 (six) hours. 07/23/23   Lockie Mola, MD  amLODipine (NORVASC) 10 MG tablet Take 10 mg by mouth every morning. 04/17/20   [provider]  ASPIRIN LOW DOSE 81 MG EC tablet Take 81 mg by mouth in the morning. 02/22/21   [provider]  atorvastatin (LIPITOR) 40 MG tablet Take 40 mg by mouth in the morning. 02/09/21   [provider]  calcium acetate (PHOSLO) 667 MG capsule Take 2 capsules (1,334 mg total) by mouth with breakfast, with lunch, and with evening meal. 04/10/21   Calvert Cantor, MD  carvedilol (COREG) 12.5 MG tablet Take 12.5 mg by mouth 2 (two) times daily with a meal. 04/17/20   [provider]  clopidogrel (PLAVIX) 75 MG tablet Take 1 tablet (75 mg total) by mouth daily. 11/02/22   Rhyne, Ames Coupe, PA-C  diazepam (VALIUM) 5 MG tablet Take 5 mg by mouth 2 (two) times daily as needed for muscle spasms. 12/09/21   [provider]  Fluticasone-Umeclidin-Vilant (TRELEGY ELLIPTA IN) Inhale 1 puff into the lungs daily.    [provider]  insulin lispro (HUMALOG KWIKPEN) 100 UNIT/ML KwikPen Inject 0-9 Units into the skin 3 (three) times daily with meals. CBG < 70:  treat low blood sugar CBG 70 - 120: 0 units  CBG 121 - 150: 1 unit  CBG 151 - 200: 2 units  CBG 201 - 250: 3 units  CBG 251 - 300: 5 units  CBG 301 - 350: 7 units  CBG 351 - 400: 9 units  CBG > 400: call MD 09/27/23   Zigmund Daniel., MD  insulin regular human CONCENTRATED (HUMULIN R U-500 KWIKPEN) 500 UNIT/ML KwikPen Inject 55 Units into the skin 2 (two) times daily with a meal. Follow up with your PCP and [facility] for additional adjustments 09/27/23 10/27/23  Zigmund Daniel., MD  iron polysaccharides (NIFEREX) 150 MG capsule Take 1 capsule (150 mg total) by mouth daily. 09/26/23   Meredeth Ide, MD  nortriptyline (PAMELOR) 10 MG capsule Take 20 mg by mouth at bedtime.    [provider]  oxyCODONE (ROXICODONE) 5 MG immediate release tablet Take 1 tablet (5 mg total) by mouth every 8 (eight) hours as needed for up to 3 days. 09/27/23 09/30/23  Zigmund Daniel., MD  pantoprazole (PROTONIX) 40 MG tablet Take 40 mg by mouth daily before breakfast. 02/09/21   [provider]  pregabalin (LYRICA) 75 MG capsule Take 1 capsule (75 mg total) by mouth See admin instructions. Daily.  Give after dialysis on dialysis days. Patient taking differently: Take 75 mg by  mouth daily. 01/21/23   [name], MD  sertraline (ZOLOFT) 100 MG tablet Take 100 mg by mouth in the morning. 02/22/21   [provider]  tamsulosin (FLOMAX) 0.4 MG CAPS capsule Take 1 capsule (0.4 mg total) by mouth daily. 11/10/21   [name], MD  torsemide (DEMADEX) 20 MG tablet Take 120 mg by mouth 2 (two) times daily.    [provider]  traZODone (DESYREL) 150 MG tablet Take 150 mg by mouth at bedtime. 12/09/21   [provider]  Vitamin D, Ergocalciferol, (DRISDOL) 1.25 MG (50000 UNIT) CAPS capsule Take 1 capsule (50,000 Units total) by mouth every 7 (seven) days. 10/02/23   Meredeth Ide, MD      Allergies    Actos [pioglitazone], Dexmedetomidine, Ibuprofen, Tomato,  and Wellbutrin [bupropion]    Review of Systems   Review of Systems  Constitutional:  Negative for fever.  Respiratory:  Negative for shortness of breath.   Cardiovascular:  Negative for chest pain.    Physical Exam Updated Vital Signs BP (!) 164/46 (BP Location: Right Arm)   Pulse 82   Temp 98 F (36.7 C) (Oral)   Resp 18   SpO2 98%  Physical Exam Vitals and nursing note reviewed.  Constitutional:      General: He is not in acute distress.    Appearance: He is not toxic-appearing.  Eyes:     General: No scleral icterus. Pulmonary:     Effort: Pulmonary effort is normal. No respiratory distress.     Comments: On his at home [place] Musculoskeletal:     Comments: Left AKA. Chronic edema.   Skin:    General: Skin is warm and dry.  Neurological:     Mental Status: He is alert.     ED Results / Procedures / Treatments   Labs (all labs ordered are listed, but only abnormal results are displayed) Labs Reviewed - No data to display  EKG None  Radiology No results found.  Procedures Procedures   Medications Ordered in ED Medications - No data to display  ED Course/ Medical Decision Making/ A&P Clinical Course as of 09/28/23 1740  Thu Sep 28, 2023  1610 Consult to nephrology, Dr. Arta Silence placed [RR]  [phone] Nephrology aware, will be taking him for dialysis. [RR]    Clinical Course User Index [RR] [name], PA-C   Medical Decision Making  62 y.o. male presents to the ER today for dialysis. Vital signs show show hypertension, otherwise unremarkable. Physical exam as noted above.   The patient is well known to the ER. He was recently admitted for HHS, but CBG is 280 here. He declines labs here and only wants dialysis nurses to draw blood. Nephrology aware that the patient is here. He will be going to dialysis.   Portions of this report may have been transcribed using voice recognition software. Every effort was made to ensure accuracy; however, inadvertent  computerized transcription errors may be present.   Final Clinical Impression(s) / ED Diagnoses Final diagnoses:  None    Rx / DC Orders ED Discharge Orders     None         [name], PA-C 09/28/23 1803    Derwood Kaplan, MD 09/29/23 1323

## 2023-09-28 NOTE — ED Notes (Signed)
Dialysis consent signed and witnessed and at bedside with pt.

## 2023-09-28 NOTE — ED Notes (Signed)
Pt refused vital signs. Pt states "I don't need it."

## 2023-09-30 ENCOUNTER — Encounter (HOSPITAL_COMMUNITY): Payer: Self-pay

## 2023-09-30 ENCOUNTER — Other Ambulatory Visit: Payer: Self-pay

## 2023-09-30 ENCOUNTER — Emergency Department (HOSPITAL_COMMUNITY)
Admission: EM | Admit: 2023-09-30 | Discharge: 2023-09-30 | Disposition: A | Discharge status: Home / Self Care | Payer: 59 | Attending: Emergency Medicine | Admitting: Emergency Medicine

## 2023-09-30 DIAGNOSIS — N186 End stage renal disease: Secondary | ICD-10-CM | POA: Insufficient documentation

## 2023-09-30 DIAGNOSIS — Z794 Long term (current) use of insulin: Secondary | ICD-10-CM | POA: Insufficient documentation

## 2023-09-30 DIAGNOSIS — Z992 Dependence on renal dialysis: Secondary | ICD-10-CM | POA: Insufficient documentation

## 2023-09-30 DIAGNOSIS — Z5329 Procedure and treatment not carried out because of patient's decision for other reasons: Secondary | ICD-10-CM | POA: Insufficient documentation

## 2023-09-30 DIAGNOSIS — Z7901 Long term (current) use of anticoagulants: Secondary | ICD-10-CM | POA: Diagnosis not present

## 2023-09-30 DIAGNOSIS — Z7982 Long term (current) use of aspirin: Secondary | ICD-10-CM | POA: Insufficient documentation

## 2023-09-30 MED ORDER — DARBEPOETIN ALFA 200 MCG/0.4ML IJ SOSY
200.0000 ug | PREFILLED_SYRINGE | Freq: Once | INTRAMUSCULAR | Status: DC
  Filled 2023-09-30: qty 0.4

## 2023-09-30 MED ORDER — CHLORHEXIDINE GLUCONATE CLOTH 2 % EX PADS: 6.0000 | MEDICATED_PAD | Freq: Every day | CUTANEOUS | Status: DC

## 2023-09-30 NOTE — ED Notes (Signed)
Consent signed for hemodialysis

## 2023-09-30 NOTE — ED Triage Notes (Signed)
Patient here for dialysis no complaints.  No respiratory issues.  On his baseline 3L [place] and switched to our tank here.

## 2023-09-30 NOTE — ED Provider Notes (Signed)
[place] EMERGENCY DEPARTMENT AT [facility] Provider Note   CSN: [id number] Arrival date & time: 09/30/23  0751     History  Chief Complaint  Patient presents with   Vascular Access Problem    Jeffrey [name] is a 62 y.o. male.  The history is provided by the patient and medical records. No language interpreter was used.  Illness Location:  Here for dialysis Quality:  No complaints Severity:  Unable to specify Onset quality:  Unable to specify Timing:  Unable to specify Progression:  Unable to specify Associated symptoms: no abdominal pain, no chest pain, no congestion, no cough, no fatigue, no fever, no headaches, no loss of consciousness, no nausea, no shortness of breath, no vomiting and no wheezing        Home Medications Prior to Admission medications   Medication Sig Start Date End Date Taking? Authorizing Provider  acetaminophen (TYLENOL) 325 MG tablet Take 2 tablets (650 mg total) by mouth every 6 (six) hours. 07/23/23   Lockie Mola, MD  amLODipine (NORVASC) 10 MG tablet Take 10 mg by mouth every morning. 04/17/20   [provider]  ASPIRIN LOW DOSE 81 MG EC tablet Take 81 mg by mouth in the morning. 02/22/21   [provider]  atorvastatin (LIPITOR) 40 MG tablet Take 40 mg by mouth in the morning. 02/09/21   [provider]  calcium acetate (PHOSLO) 667 MG capsule Take 2 capsules (1,334 mg total) by mouth with breakfast, with lunch, and with evening meal. 04/10/21   Calvert Cantor, MD  carvedilol (COREG) 12.5 MG tablet Take 12.5 mg by mouth 2 (two) times daily with a meal. 04/17/20   [provider]  clopidogrel (PLAVIX) 75 MG tablet Take 1 tablet (75 mg total) by mouth daily. 11/02/22   Rhyne, Ames Coupe, PA-C  diazepam (VALIUM) 5 MG tablet Take 5 mg by mouth 2 (two) times daily as needed for muscle spasms. 12/09/21   [provider]  Fluticasone-Umeclidin-Vilant (TRELEGY ELLIPTA IN) Inhale 1 puff into the lungs  daily.    [provider]  insulin lispro (HUMALOG KWIKPEN) 100 UNIT/ML KwikPen Inject 0-9 Units into the skin 3 (three) times daily with meals. CBG < 70: treat low blood sugar CBG 70 - 120: 0 units  CBG 121 - 150: 1 unit  CBG 151 - 200: 2 units  CBG 201 - 250: 3 units  CBG 251 - 300: 5 units  CBG 301 - 350: 7 units  CBG 351 - 400: 9 units  CBG > 400: call MD 09/27/23   Zigmund Daniel., MD  insulin regular human CONCENTRATED (HUMULIN R U-500 KWIKPEN) 500 UNIT/ML KwikPen Inject 55 Units into the skin 2 (two) times daily with a meal. Follow up with your PCP and [facility] for additional adjustments 09/27/23 10/27/23  Zigmund Daniel., MD  iron polysaccharides (NIFEREX) 150 MG capsule Take 1 capsule (150 mg total) by mouth daily. 09/26/23   Meredeth Ide, MD  nortriptyline (PAMELOR) 10 MG capsule Take 20 mg by mouth at bedtime.    [provider]  oxyCODONE (ROXICODONE) 5 MG immediate release tablet Take 1 tablet (5 mg total) by mouth every 8 (eight) hours as needed for up to 3 days. 09/27/23 09/30/23  Zigmund Daniel., MD  pantoprazole (PROTONIX) 40 MG tablet Take 40 mg by mouth daily before breakfast. 02/09/21   [provider]  pregabalin (LYRICA) 75 MG capsule Take 1 capsule (75 mg  total) by mouth See admin instructions. Daily.  Give after dialysis on dialysis days. Patient taking differently: Take 75 mg by mouth daily. 01/21/23   [name], MD  sertraline (ZOLOFT) 100 MG tablet Take 100 mg by mouth in the morning. 02/22/21   [provider]  tamsulosin (FLOMAX) 0.4 MG CAPS capsule Take 1 capsule (0.4 mg total) by mouth daily. 11/10/21   [name], MD  torsemide (DEMADEX) 20 MG tablet Take 120 mg by mouth 2 (two) times daily.    [provider]  traZODone (DESYREL) 150 MG tablet Take 150 mg by mouth at bedtime. 12/09/21   [provider]  Vitamin D, Ergocalciferol, (DRISDOL) 1.25 MG (50000 UNIT) CAPS capsule Take 1  capsule (50,000 Units total) by mouth every 7 (seven) days. 10/02/23   Meredeth Ide, MD      Allergies    Actos [pioglitazone], Dexmedetomidine, Ibuprofen, Tomato, and Wellbutrin [bupropion]    Review of Systems   Review of Systems  Constitutional:  Negative for chills, fatigue and fever.  HENT:  Negative for congestion.   Respiratory:  Negative for cough, chest tightness, shortness of breath and wheezing.   Cardiovascular:  Negative for chest pain.  Gastrointestinal:  Negative for abdominal pain, nausea and vomiting.  Genitourinary:  Negative for flank pain.  Musculoskeletal:  Negative for back pain.  Neurological:  Negative for loss of consciousness and headaches.  Psychiatric/Behavioral:  Negative for agitation.   All other systems reviewed and are negative.   Physical Exam Updated Vital Signs BP (!) 159/46 (BP Location: Right Arm)   Pulse 81   Temp 97.8 F (36.6 C)   Resp 18   Ht 5\' 5"  (1.651 m)   Wt 124.7 kg   SpO2 100%   BMI 45.76 kg/m  Physical Exam Vitals and nursing note reviewed.  Constitutional:      General: He is not in acute distress.    Appearance: He is well-developed. He is not ill-appearing, toxic-appearing or diaphoretic.  HENT:     Head: Normocephalic and atraumatic.     Nose: Nose normal.     Mouth/Throat:     Mouth: Mucous membranes are moist.  Eyes:     Conjunctiva/sclera: Conjunctivae normal.  Cardiovascular:     Rate and Rhythm: Normal rate and regular rhythm.     Heart sounds: No murmur heard. Pulmonary:     Effort: Pulmonary effort is normal. No respiratory distress.     Breath sounds: Normal breath sounds. No wheezing, rhonchi or rales.  Chest:     Chest wall: No tenderness.  Abdominal:     General: Abdomen is flat.     Palpations: Abdomen is soft.     Tenderness: There is no abdominal tenderness. There is no guarding or rebound.  Musculoskeletal:        General: No tenderness.     Cervical back: Neck supple.  Skin:    General:  Skin is warm and dry.     Capillary Refill: Capillary refill takes less than 2 seconds.     Findings: No erythema.  Neurological:     Mental Status: He is alert.  Psychiatric:        Mood and Affect: Mood normal.     ED Results / Procedures / Treatments   Labs (all labs ordered are listed, but only abnormal results are displayed) Labs Reviewed - No data to display  EKG None  Radiology No results found.  Procedures Procedures    Medications Ordered  in ED Medications - No data to display  ED Course/ Medical Decision Making/ A&P                                 Medical Decision Making   Jeffrey [name] is a 62 y.o. male who comes here for his dialysis.  He reports no new symptoms and has not missed any treatments.  Denies any fevers, chills, congestion, cough, nausea, vomiting, constipation or diarrhea.  No pain at this time.  Patient resting comfortably and just finished his [employer] breakfast here in the emergency department.  On exam, lungs clear.  Chest nontender.  Abdomen nontender.  On home oxygen and resting comfortably.  Will call nephrology to discuss getting his dialysis today.  Anticipate discharge after dialysis.         Final Clinical Impression(s) / ED Diagnoses Final diagnoses:  ESRD (end stage renal disease) (HCC)    Clinical Impression: 1. ESRD (end stage renal disease) (HCC)     Disposition: Will get dialysis and then anticipate reassessment and discharge after dialysis  This note was prepared with assistance of Dragon voice recognition software. Occasional wrong-word or sound-a-like substitutions may have occurred due to the inherent limitations of voice recognition software.     Antoine Fiallos, Canary Brim, MD 09/30/23 1234

## 2023-09-30 NOTE — ED Notes (Signed)
Pt leaving AMA, asked to sign form states he does not do paperwork, states he called up to dialysis and they could not give him a time when he would be going so he is leaving to go home and watch his shows at home. Providers notified.

## 2023-10-05 ENCOUNTER — Emergency Department (HOSPITAL_COMMUNITY)
Admission: EM | Admit: 2023-10-05 | Discharge: 2023-10-06 | Disposition: A | Discharge status: Home / Self Care | Payer: 59 | Attending: Emergency Medicine | Admitting: Emergency Medicine

## 2023-10-05 ENCOUNTER — Other Ambulatory Visit: Payer: Self-pay

## 2023-10-05 DIAGNOSIS — J449 Chronic obstructive pulmonary disease, unspecified: Secondary | ICD-10-CM | POA: Insufficient documentation

## 2023-10-05 DIAGNOSIS — Z992 Dependence on renal dialysis: Secondary | ICD-10-CM | POA: Diagnosis not present

## 2023-10-05 DIAGNOSIS — E119 Type 2 diabetes mellitus without complications: Secondary | ICD-10-CM | POA: Insufficient documentation

## 2023-10-05 DIAGNOSIS — Z794 Long term (current) use of insulin: Secondary | ICD-10-CM | POA: Diagnosis not present

## 2023-10-05 DIAGNOSIS — N186 End stage renal disease: Secondary | ICD-10-CM | POA: Insufficient documentation

## 2023-10-05 DIAGNOSIS — Z7951 Long term (current) use of inhaled steroids: Secondary | ICD-10-CM | POA: Insufficient documentation

## 2023-10-05 DIAGNOSIS — Z7982 Long term (current) use of aspirin: Secondary | ICD-10-CM | POA: Insufficient documentation

## 2023-10-05 LAB — CBC
HCT: 29.4 % — ABNORMAL LOW (ref 39.0–52.0)
Hemoglobin: 9.5 g/dL — ABNORMAL LOW (ref 13.0–17.0)
MCH: 29 pg (ref 26.0–34.0)
MCHC: 32.3 g/dL (ref 30.0–36.0)
MCV: 89.6 fL (ref 80.0–100.0)
Platelets: 114 10*3/uL — ABNORMAL LOW (ref 150–400)
RBC: 3.28 MIL/uL — ABNORMAL LOW (ref 4.22–5.81)
RDW: 16.6 % — ABNORMAL HIGH (ref 11.5–15.5)
WBC: 6.2 10*3/uL (ref 4.0–10.5)
nRBC: 0 % (ref 0.0–0.2)

## 2023-10-05 LAB — RENAL FUNCTION PANEL
Albumin: 3.7 g/dL (ref 3.5–5.0)
Anion gap: 14 (ref 5–15)
BUN: 113 mg/dL — ABNORMAL HIGH (ref 8–23)
CO2: 26 mmol/L (ref 22–32)
Calcium: 8.5 mg/dL — ABNORMAL LOW (ref 8.9–10.3)
Chloride: 94 mmol/L — ABNORMAL LOW (ref 98–111)
Creatinine, Ser: 6.47 mg/dL — ABNORMAL HIGH (ref 0.61–1.24)
GFR, Estimated: 9 mL/min — ABNORMAL LOW (ref >60)
Glucose, Bld: 178 mg/dL — ABNORMAL HIGH (ref 70–99)
Phosphorus: 3.8 mg/dL (ref 2.5–4.6)
Potassium: 3.7 mmol/L (ref 3.5–5.1)
Sodium: 134 mmol/L — ABNORMAL LOW (ref 135–145)

## 2023-10-05 LAB — COMPREHENSIVE METABOLIC PANEL
ALT: 11 U/L (ref 0–44)
AST: 12 U/L — ABNORMAL LOW (ref 15–41)
Albumin: 3.6 g/dL (ref 3.5–5.0)
Alkaline Phosphatase: 77 U/L (ref 38–126)
Anion gap: 15 (ref 5–15)
BUN: 111 mg/dL — ABNORMAL HIGH (ref 8–23)
CO2: 26 mmol/L (ref 22–32)
Calcium: 8.5 mg/dL — ABNORMAL LOW (ref 8.9–10.3)
Chloride: 93 mmol/L — ABNORMAL LOW (ref 98–111)
Creatinine, Ser: 6.47 mg/dL — ABNORMAL HIGH (ref 0.61–1.24)
GFR, Estimated: 9 mL/min — ABNORMAL LOW (ref >60)
Glucose, Bld: 180 mg/dL — ABNORMAL HIGH (ref 70–99)
Potassium: 3.6 mmol/L (ref 3.5–5.1)
Sodium: 134 mmol/L — ABNORMAL LOW (ref 135–145)
Total Bilirubin: 0.4 mg/dL (ref <1.2)
Total Protein: 6.8 g/dL (ref 6.5–8.1)

## 2023-10-05 MED ORDER — LIDOCAINE-PRILOCAINE 2.5-2.5 % EX CREA: 1.0000 | TOPICAL_CREAM | CUTANEOUS | Status: DC | PRN

## 2023-10-05 MED ORDER — HEPARIN SODIUM (PORCINE) 1000 UNIT/ML DIALYSIS
40.0000 [IU]/kg | INTRAMUSCULAR | Status: DC | PRN
  Administered 2023-10-05: 5000 [IU] via INTRAVENOUS_CENTRAL
  Filled 2023-10-05 (×2): qty 5

## 2023-10-05 MED ORDER — NEPRO/CARBSTEADY PO LIQD: 237.0000 mL | ORAL | Status: DC | PRN

## 2023-10-05 MED ORDER — CHLORHEXIDINE GLUCONATE CLOTH 2 % EX PADS
6.0000 | MEDICATED_PAD | Freq: Every day | CUTANEOUS | Status: DC
Start: 2023-10-05 — End: 2023-10-06

## 2023-10-05 MED ORDER — PENTAFLUOROPROP-TETRAFLUOROETH EX AERO: 1.0000 | INHALATION_SPRAY | CUTANEOUS | Status: DC | PRN

## 2023-10-05 NOTE — ED Triage Notes (Signed)
Pt. Stated, I missed the last 2 visits cause my chair was broke. Jeffrey [name] been tired for the last 4 days. Pt refused an EKG

## 2023-10-05 NOTE — ED Provider Notes (Signed)
[place] EMERGENCY DEPARTMENT AT [facility] Provider Note   CSN: [id number] Arrival date & time: 10/05/23  0734     History  Chief Complaint  Patient presents with   Vascular Access Problem    Jeffrey [name] is a 62 y.o. male with history of end-stage renal disease on dialysis, COPD, type 2 diabetes, presents requesting dialysis today.  He is on a Tuesday, Thursday, Saturday dialysis schedule.  Reports feeling slightly tired for the past couple days, but otherwise at his baseline.  Denies any chest pain or shortness of breath.  States he missed his last 2 dialysis sessions because his wheelchair was broken.  HPI     Home Medications Prior to Admission medications   Medication Sig Start Date End Date Taking? Authorizing Provider  acetaminophen (TYLENOL) 325 MG tablet Take 2 tablets (650 mg total) by mouth every 6 (six) hours. 07/23/23   Lockie Mola, MD  amLODipine (NORVASC) 10 MG tablet Take 10 mg by mouth every morning. 04/17/20   [provider]  ASPIRIN LOW DOSE 81 MG EC tablet Take 81 mg by mouth in the morning. 02/22/21   [provider]  atorvastatin (LIPITOR) 40 MG tablet Take 40 mg by mouth in the morning. 02/09/21   [provider]  calcium acetate (PHOSLO) 667 MG capsule Take 2 capsules (1,334 mg total) by mouth with breakfast, with lunch, and with evening meal. 04/10/21   Calvert Cantor, MD  carvedilol (COREG) 12.5 MG tablet Take 12.5 mg by mouth 2 (two) times daily with a meal. 04/17/20   [provider]  clopidogrel (PLAVIX) 75 MG tablet Take 1 tablet (75 mg total) by mouth daily. 11/02/22   Rhyne, Ames Coupe, PA-C  diazepam (VALIUM) 5 MG tablet Take 5 mg by mouth 2 (two) times daily as needed for muscle spasms. 12/09/21   [provider]  Fluticasone-Umeclidin-Vilant (TRELEGY ELLIPTA IN) Inhale 1 puff into the lungs daily.    [provider]  insulin lispro (HUMALOG KWIKPEN) 100 UNIT/ML KwikPen Inject 0-9 Units  into the skin 3 (three) times daily with meals. CBG < 70: treat low blood sugar CBG 70 - 120: 0 units  CBG 121 - 150: 1 unit  CBG 151 - 200: 2 units  CBG 201 - 250: 3 units  CBG 251 - 300: 5 units  CBG 301 - 350: 7 units  CBG 351 - 400: 9 units  CBG > 400: call MD 09/27/23   Zigmund Daniel., MD  insulin regular human CONCENTRATED (HUMULIN R U-500 KWIKPEN) 500 UNIT/ML KwikPen Inject 55 Units into the skin 2 (two) times daily with a meal. Follow up with your PCP and [facility] for additional adjustments 09/27/23 10/27/23  Zigmund Daniel., MD  iron polysaccharides (NIFEREX) 150 MG capsule Take 1 capsule (150 mg total) by mouth daily. 09/26/23   Meredeth Ide, MD  nortriptyline (PAMELOR) 10 MG capsule Take 20 mg by mouth at bedtime.    [provider]  pantoprazole (PROTONIX) 40 MG tablet Take 40 mg by mouth daily before breakfast. 02/09/21   [provider]  pregabalin (LYRICA) 75 MG capsule Take 1 capsule (75 mg total) by mouth See admin instructions. Daily.  Give after dialysis on dialysis days. Patient taking differently: Take 75 mg by mouth daily. 01/21/23   [name], MD  sertraline (ZOLOFT) 100 MG tablet Take 100 mg by mouth in the morning. 02/22/21   [provider]  tamsulosin (FLOMAX) 0.4  MG CAPS capsule Take 1 capsule (0.4 mg total) by mouth daily. 11/10/21   [name], MD  torsemide (DEMADEX) 20 MG tablet Take 120 mg by mouth 2 (two) times daily.    [provider]  traZODone (DESYREL) 150 MG tablet Take 150 mg by mouth at bedtime. 12/09/21   [provider]  Vitamin D, Ergocalciferol, (DRISDOL) 1.25 MG (50000 UNIT) CAPS capsule Take 1 capsule (50,000 Units total) by mouth every 7 (seven) days. 10/02/23   Meredeth Ide, MD      Allergies    Actos [pioglitazone], Dexmedetomidine, Ibuprofen, Tomato, and Wellbutrin [bupropion]    Review of Systems   Review of Systems  Constitutional:  Negative for fever.  Respiratory:   Negative for shortness of breath.   Cardiovascular:  Negative for chest pain.    Physical Exam Updated Vital Signs BP (!) 154/55 (BP Location: Right Arm)   Pulse 79   Temp 97.6 F (36.4 C) (Oral)   Resp 13   SpO2 100%  Physical Exam Vitals and nursing note reviewed.  Constitutional:      Appearance: Normal appearance.  HENT:     Head: Atraumatic.  Cardiovascular:     Rate and Rhythm: Normal rate and regular rhythm.  Pulmonary:     Effort: Pulmonary effort is normal.     Comments: Lungs clear to auscultation bilaterally Neurological:     General: No focal deficit present.     Mental Status: He is alert.  Psychiatric:        Mood and Affect: Mood normal.        Behavior: Behavior normal.     ED Results / Procedures / Treatments   Labs (all labs ordered are listed, but only abnormal results are displayed) Labs Reviewed  COMPREHENSIVE METABOLIC PANEL - Abnormal; Notable for the following components:      Result Value   Sodium 134 (*)    Chloride 93 (*)    Glucose, Bld 180 (*)    BUN 111 (*)    Creatinine, Ser 6.47 (*)    Calcium 8.5 (*)    AST 12 (*)    GFR, Estimated 9 (*)    All other components within normal limits  RENAL FUNCTION PANEL - Abnormal; Notable for the following components:   Sodium 134 (*)    Chloride 94 (*)    Glucose, Bld 178 (*)    BUN 113 (*)    Creatinine, Ser 6.47 (*)    Calcium 8.5 (*)    GFR, Estimated 9 (*)    All other components within normal limits  CBC - Abnormal; Notable for the following components:   RBC 3.28 (*)    Hemoglobin 9.5 (*)    HCT 29.4 (*)    RDW 16.6 (*)    Platelets 114 (*)    All other components within normal limits    EKG None  Radiology No results found.  Procedures Procedures    Medications Ordered in ED Medications  Chlorhexidine Gluconate Cloth 2 % PADS 6 each (has no administration in time range)  pentafluoroprop-tetrafluoroeth (GEBAUERS) aerosol 1 Application (has no administration in time  range)  lidocaine-prilocaine (EMLA) cream 1 Application (has no administration in time range)  feeding supplement (NEPRO CARB STEADY) liquid 237 mL (has no administration in time range)  heparin injection 5,000 Units (5,000 Units Dialysis Given 10/05/23 0944)    ED Course/ Medical Decision Making/ A&P  Medical Decision Making Amount and/or Complexity of Data Reviewed Labs: ordered.     Differential diagnosis includes but is not limited to end-stage renal disease requiring dialysis, electrolyte normality, arrhythmia  ED Course:  Patient overall well-appearing, no acute distress.  Vital signs stable.  Presents requesting routine dialysis.  He did miss his last 2 sessions and reported feeling more tired than normal. EKG was obtained which showed normal sinus rhythm.  Dr. Allena Katz with nephrology was consulted.  They will plan for dialysis.  Expect patient will be appropriate for discharge once he returns to the ER.   Impression: ESRD requiring dialysis  Disposition:  Care of this patient signed out to oncoming ED provider Britni Henderly, PA-C. Disposition and treatment plan pending imaging results and clinical judgment of oncoming ED team.    Cardiac Monitoring: / EKG: The patient was maintained on a cardiac monitor.  I personally viewed and interpreted the cardiac monitored which showed an underlying rhythm of: Normal sinus rhythm   Consultations Obtained: I requested consultation with the nephrologist Dr. Allena Katz,  and discussed lab and imaging findings as well as pertinent plan - they recommend: Dialysis  External records from outside source obtained and reviewed including last ER visit from 09/30/2023 where he received dialysis              Final Clinical Impression(s) / ED Diagnoses Final diagnoses:  ESRD (end stage renal disease) on dialysis [facility])    Rx / DC Orders ED Discharge Orders     None         Arabella Merles,  PA-C 10/05/23 1532    Pricilla Loveless, MD 10/07/23 [phone]

## 2023-10-05 NOTE — Progress Notes (Signed)
Received patient in bed to unit.  Alert and oriented.  Informed consent signed and in chart.   TX duration:3.25  Patient tolerated well.  Transported back to the room  Alert, without acute distress.  Hand-off given to patient's nurse.   Access used: left AVG Access issues: none  Total UF removed: 1.1L Medication(s) given: none   10/05/23 1317  Vitals  Temp 97.6 F (36.4 C)  Temp Source Oral  BP (!) 157/77  MAP (mmHg) 100  BP Location Right Wrist  BP Method Automatic  Patient Position (if appropriate) Lying  Pulse Rate 78  Pulse Rate Source Monitor  ECG Heart Rate 78  Resp 15  Oxygen Therapy  SpO2 100 %  O2 Device Nasal Cannula  O2 Flow Rate (L/min) 3 L/min  During Treatment Monitoring  Intra-Hemodialysis Comments Tx completed  Dialysis Fluid Bolus Normal Saline  Bolus Amount (mL) 300 mL      [name] S [facility]

## 2023-10-07 ENCOUNTER — Encounter (HOSPITAL_COMMUNITY): Payer: Self-pay

## 2023-10-07 ENCOUNTER — Emergency Department (HOSPITAL_COMMUNITY)
Admission: EM | Admit: 2023-10-07 | Discharge: 2023-10-07 | Disposition: A | Discharge status: Home / Self Care | Payer: 59 | Attending: Emergency Medicine | Admitting: Emergency Medicine

## 2023-10-07 ENCOUNTER — Other Ambulatory Visit: Payer: Self-pay

## 2023-10-07 DIAGNOSIS — Z794 Long term (current) use of insulin: Secondary | ICD-10-CM | POA: Diagnosis not present

## 2023-10-07 DIAGNOSIS — Z7901 Long term (current) use of anticoagulants: Secondary | ICD-10-CM | POA: Insufficient documentation

## 2023-10-07 DIAGNOSIS — E1122 Type 2 diabetes mellitus with diabetic chronic kidney disease: Secondary | ICD-10-CM | POA: Insufficient documentation

## 2023-10-07 DIAGNOSIS — Z992 Dependence on renal dialysis: Secondary | ICD-10-CM | POA: Insufficient documentation

## 2023-10-07 DIAGNOSIS — Z7982 Long term (current) use of aspirin: Secondary | ICD-10-CM | POA: Diagnosis not present

## 2023-10-07 DIAGNOSIS — I509 Heart failure, unspecified: Secondary | ICD-10-CM | POA: Diagnosis not present

## 2023-10-07 DIAGNOSIS — N186 End stage renal disease: Secondary | ICD-10-CM | POA: Insufficient documentation

## 2023-10-07 DIAGNOSIS — J449 Chronic obstructive pulmonary disease, unspecified: Secondary | ICD-10-CM | POA: Insufficient documentation

## 2023-10-07 MED ORDER — CHLORHEXIDINE GLUCONATE CLOTH 2 % EX PADS: 6.0000 | MEDICATED_PAD | Freq: Every day | CUTANEOUS | Status: DC

## 2023-10-07 NOTE — ED Triage Notes (Addendum)
Pt presents for dialysis. Denies any other problems. Last dialysis 2 days ago.

## 2023-10-07 NOTE — Procedures (Signed)
HD Note:  Some information was entered later than the data was gathered due to patient care needs. The stated time with the data is accurate.  Received patient in bed to unit.   Alert and oriented.   Informed consent signed and in chart.   Access used: Upper left arm fistula Access issues: none  Patient tolerated treatment well.   TX duration: 3.25 hours  Alert, without acute distress.  Total UF removed: 3000 ml  Hand-off given to patient's nurse.   Transported back to the room   Meha Vidrine L. Dareen Piano, RN [facility].

## 2023-10-07 NOTE — ED Provider Notes (Signed)
[place] EMERGENCY DEPARTMENT AT [facility] Provider Note   CSN: [id number] Arrival date & time: 10/07/23  4098     History  Chief Complaint  Patient presents with   needs dialysis    Jeffrey [name] is a 62 y.o. male patient with end-stage renal disease on dialysis, COPD, congestive heart failure, diabetes reporting to emergency room for scheduled dialysis.  Patient reports Tuesday Thursday Saturday to emergency room for dialysis. Last dialysis was two days ago.  Denies any complaints at this time.  HPI     Home Medications Prior to Admission medications   Medication Sig Start Date End Date Taking? Authorizing Provider  acetaminophen (TYLENOL) 325 MG tablet Take 2 tablets (650 mg total) by mouth every 6 (six) hours. 07/23/23   Lockie Mola, MD  amLODipine (NORVASC) 10 MG tablet Take 10 mg by mouth every morning. 04/17/20   [provider]  ASPIRIN LOW DOSE 81 MG EC tablet Take 81 mg by mouth in the morning. 02/22/21   [provider]  atorvastatin (LIPITOR) 40 MG tablet Take 40 mg by mouth in the morning. 02/09/21   [provider]  calcium acetate (PHOSLO) 667 MG capsule Take 2 capsules (1,334 mg total) by mouth with breakfast, with lunch, and with evening meal. 04/10/21   Calvert Cantor, MD  carvedilol (COREG) 12.5 MG tablet Take 12.5 mg by mouth 2 (two) times daily with a meal. 04/17/20   [provider]  clopidogrel (PLAVIX) 75 MG tablet Take 1 tablet (75 mg total) by mouth daily. 11/02/22   Rhyne, Ames Coupe, PA-C  diazepam (VALIUM) 5 MG tablet Take 5 mg by mouth 2 (two) times daily as needed for muscle spasms. 12/09/21   [provider]  Fluticasone-Umeclidin-Vilant (TRELEGY ELLIPTA IN) Inhale 1 puff into the lungs daily.    [provider]  insulin lispro (HUMALOG KWIKPEN) 100 UNIT/ML KwikPen Inject 0-9 Units into the skin 3 (three) times daily with meals. CBG < 70: treat low blood sugar CBG 70 - 120: 0 units  CBG  121 - 150: 1 unit  CBG 151 - 200: 2 units  CBG 201 - 250: 3 units  CBG 251 - 300: 5 units  CBG 301 - 350: 7 units  CBG 351 - 400: 9 units  CBG > 400: call MD 09/27/23   Zigmund Daniel., MD  insulin regular human CONCENTRATED (HUMULIN R U-500 KWIKPEN) 500 UNIT/ML KwikPen Inject 55 Units into the skin 2 (two) times daily with a meal. Follow up with your PCP and [facility] for additional adjustments 09/27/23 10/27/23  Zigmund Daniel., MD  iron polysaccharides (NIFEREX) 150 MG capsule Take 1 capsule (150 mg total) by mouth daily. 09/26/23   Meredeth Ide, MD  nortriptyline (PAMELOR) 10 MG capsule Take 20 mg by mouth at bedtime.    [provider]  pantoprazole (PROTONIX) 40 MG tablet Take 40 mg by mouth daily before breakfast. 02/09/21   [provider]  pregabalin (LYRICA) 75 MG capsule Take 1 capsule (75 mg total) by mouth See admin instructions. Daily.  Give after dialysis on dialysis days. Patient taking differently: Take 75 mg by mouth daily. 01/21/23   [name], MD  sertraline (ZOLOFT) 100 MG tablet Take 100 mg by mouth in the morning. 02/22/21   [provider]  tamsulosin (FLOMAX) 0.4 MG CAPS capsule Take 1 capsule (0.4 mg total) by mouth daily. 11/10/21   [name], MD  torsemide (DEMADEX) 20  MG tablet Take 120 mg by mouth 2 (two) times daily.    [provider]  traZODone (DESYREL) 150 MG tablet Take 150 mg by mouth at bedtime. 12/09/21   [provider]  Vitamin D, Ergocalciferol, (DRISDOL) 1.25 MG (50000 UNIT) CAPS capsule Take 1 capsule (50,000 Units total) by mouth every 7 (seven) days. 10/02/23   Meredeth Ide, MD      Allergies    Actos [pioglitazone], Dexmedetomidine, Ibuprofen, Tomato, and Wellbutrin [bupropion]    Review of Systems   Review of Systems  Constitutional:        Needing dialysis     Physical Exam Updated Vital Signs BP (!) 140/59 (BP Location: Right Arm)   Pulse 87   Temp 97.9 F (36.6 C)  (Oral)   Resp 16   Ht 5\' 5"  (1.651 m)   Wt 45.8 kg   SpO2 100%   BMI 16.79 kg/m  Physical Exam Vitals and nursing note reviewed.  Constitutional:      General: He is not in acute distress.    Appearance: He is not toxic-appearing.  HENT:     Head: Normocephalic and atraumatic.  Eyes:     General: No scleral icterus.    Conjunctiva/sclera: Conjunctivae normal.  Cardiovascular:     Rate and Rhythm: Normal rate and regular rhythm.     Pulses: Normal pulses.     Heart sounds: Normal heart sounds.  Pulmonary:     Effort: Pulmonary effort is normal. No respiratory distress.     Breath sounds: Normal breath sounds.  Abdominal:     General: Abdomen is flat. Bowel sounds are normal.     Palpations: Abdomen is soft.     Tenderness: There is no abdominal tenderness.  Skin:    General: Skin is warm and dry.     Findings: No lesion.  Neurological:     General: No focal deficit present.     Mental Status: He is alert and oriented to person, place, and time. Mental status is at baseline.     ED Results / Procedures / Treatments   Labs (all labs ordered are listed, but only abnormal results are displayed) Labs Reviewed - No data to display  EKG None  Radiology No results found.  Procedures Procedures    Medications Ordered in ED Medications - No data to display  ED Course/ Medical Decision Making/ A&P                                 Medical Decision Making  This patient presents to the ED for concern of needing scheduled dialysis    Co morbidities that complicate the patient evaluation  end-stage renal disease on dialysis, COPD, congestive heart failure, diabetes    Consultations Obtained:  Nephrology was consulted who will put in orders for patient's dialysis.   Problem List / ED Course / Critical interventions / Medication management  Patient reporting to emergency room for dialysis.  Patient has end-stage renal disease on dialysis Tuesday Thursday  Saturday.  Patient reports his last dialysis was 2 days ago.  He is not having any symptoms just reporting for dialysis.  Nephrology was consulted who will put in orders.  Imaging and labs were not obtained secondary to patient not having any change in symptoms from baseline.  Patient is overall well-appearing.  I anticipate patient will be stable for discharge after dialysis.  Plan  Dialysis  Final Clinical Impression(s) / ED Diagnoses Final diagnoses:  None    Rx / DC Orders ED Discharge Orders     None         Smitty Knudsen, PA-C 10/07/23 1535    Pricilla Loveless, MD 10/08/23 1030

## 2023-10-07 NOTE — ED Notes (Signed)
Patient returned from dialysis states he is going home

## 2023-10-10 ENCOUNTER — Encounter (HOSPITAL_COMMUNITY): Payer: Self-pay

## 2023-10-10 ENCOUNTER — Emergency Department (HOSPITAL_COMMUNITY)
Admission: EM | Admit: 2023-10-10 | Discharge: 2023-10-10 | Discharge status: Against Medical Advice | Payer: 59 | Attending: Emergency Medicine | Admitting: Emergency Medicine

## 2023-10-10 ENCOUNTER — Other Ambulatory Visit: Payer: Self-pay

## 2023-10-10 DIAGNOSIS — Z7982 Long term (current) use of aspirin: Secondary | ICD-10-CM | POA: Diagnosis not present

## 2023-10-10 DIAGNOSIS — Z992 Dependence on renal dialysis: Secondary | ICD-10-CM | POA: Insufficient documentation

## 2023-10-10 DIAGNOSIS — Z794 Long term (current) use of insulin: Secondary | ICD-10-CM | POA: Diagnosis not present

## 2023-10-10 DIAGNOSIS — Z7902 Long term (current) use of antithrombotics/antiplatelets: Secondary | ICD-10-CM | POA: Diagnosis not present

## 2023-10-10 DIAGNOSIS — N186 End stage renal disease: Secondary | ICD-10-CM | POA: Insufficient documentation

## 2023-10-10 DIAGNOSIS — Z4931 Encounter for adequacy testing for hemodialysis: Secondary | ICD-10-CM | POA: Diagnosis present

## 2023-10-10 MED ORDER — ANTICOAGULANT SODIUM CITRATE 4% (200MG/5ML) IV SOLN: 5.0000 mL | Status: DC | PRN

## 2023-10-10 MED ORDER — CHLORHEXIDINE GLUCONATE CLOTH 2 % EX PADS: 6.0000 | MEDICATED_PAD | Freq: Every day | CUTANEOUS | Status: DC

## 2023-10-10 MED ORDER — LIDOCAINE-PRILOCAINE 2.5-2.5 % EX CREA: 1.0000 | TOPICAL_CREAM | CUTANEOUS | Status: DC | PRN

## 2023-10-10 MED ORDER — HEPARIN SODIUM (PORCINE) 1000 UNIT/ML DIALYSIS: 1000.0000 [IU] | INTRAMUSCULAR | Status: DC | PRN

## 2023-10-10 MED ORDER — NEPRO/CARBSTEADY PO LIQD: 237.0000 mL | ORAL | Status: DC | PRN

## 2023-10-10 MED ORDER — LIDOCAINE HCL (PF) 1 % IJ SOLN: 5.0000 mL | INTRAMUSCULAR | Status: DC | PRN

## 2023-10-10 MED ORDER — ALTEPLASE 2 MG IJ SOLR: 2.0000 mg | Freq: Once | INTRAMUSCULAR | Status: DC | PRN

## 2023-10-10 MED ORDER — PENTAFLUOROPROP-TETRAFLUOROETH EX AERO: 1.0000 | INHALATION_SPRAY | CUTANEOUS | Status: DC | PRN

## 2023-10-10 MED ORDER — HEPARIN SODIUM (PORCINE) 1000 UNIT/ML DIALYSIS: 2000.0000 [IU] | Freq: Once | INTRAMUSCULAR | Status: DC

## 2023-10-10 NOTE — ED Provider Notes (Signed)
[place] EMERGENCY DEPARTMENT AT [facility] Provider Note   CSN: [id number] Arrival date & time: 10/10/23  0725     History  Chief Complaint  Patient presents with   Vascular Access Problem    Jeffrey [name] is a 62 y.o. male.  HPI Patient on dialysis presents with need for this.  Patient last dialyzed 3 days ago, per schedule.  He has no current complaints, is awake, alert, offer exam and history.    Home Medications Prior to Admission medications   Medication Sig Start Date End Date Taking? Authorizing Provider  acetaminophen (TYLENOL) 325 MG tablet Take 2 tablets (650 mg total) by mouth every 6 (six) hours. 07/23/23   Lockie Mola, MD  amLODipine (NORVASC) 10 MG tablet Take 10 mg by mouth every morning. 04/17/20   [provider]  ASPIRIN LOW DOSE 81 MG EC tablet Take 81 mg by mouth in the morning. 02/22/21   [provider]  atorvastatin (LIPITOR) 40 MG tablet Take 40 mg by mouth in the morning. 02/09/21   [provider]  calcium acetate (PHOSLO) 667 MG capsule Take 2 capsules (1,334 mg total) by mouth with breakfast, with lunch, and with evening meal. 04/10/21   Calvert Cantor, MD  carvedilol (COREG) 12.5 MG tablet Take 12.5 mg by mouth 2 (two) times daily with a meal. 04/17/20   [provider]  clopidogrel (PLAVIX) 75 MG tablet Take 1 tablet (75 mg total) by mouth daily. 11/02/22   Rhyne, Ames Coupe, PA-C  diazepam (VALIUM) 5 MG tablet Take 5 mg by mouth 2 (two) times daily as needed for muscle spasms. 12/09/21   [provider]  Fluticasone-Umeclidin-Vilant (TRELEGY ELLIPTA IN) Inhale 1 puff into the lungs daily.    [provider]  insulin lispro (HUMALOG KWIKPEN) 100 UNIT/ML KwikPen Inject 0-9 Units into the skin 3 (three) times daily with meals. CBG < 70: treat low blood sugar CBG 70 - 120: 0 units  CBG 121 - 150: 1 unit  CBG 151 - 200: 2 units  CBG 201 - 250: 3 units  CBG 251 - 300: 5 units  CBG 301 - 350:  7 units  CBG 351 - 400: 9 units  CBG > 400: call MD 09/27/23   Zigmund Daniel., MD  insulin regular human CONCENTRATED (HUMULIN R U-500 KWIKPEN) 500 UNIT/ML KwikPen Inject 55 Units into the skin 2 (two) times daily with a meal. Follow up with your PCP and [facility] for additional adjustments 09/27/23 10/27/23  Zigmund Daniel., MD  iron polysaccharides (NIFEREX) 150 MG capsule Take 1 capsule (150 mg total) by mouth daily. 09/26/23   Meredeth Ide, MD  nortriptyline (PAMELOR) 10 MG capsule Take 20 mg by mouth at bedtime.    [provider]  pantoprazole (PROTONIX) 40 MG tablet Take 40 mg by mouth daily before breakfast. 02/09/21   [provider]  pregabalin (LYRICA) 75 MG capsule Take 1 capsule (75 mg total) by mouth See admin instructions. Daily.  Give after dialysis on dialysis days. Patient taking differently: Take 75 mg by mouth daily. 01/21/23   [name], MD  sertraline (ZOLOFT) 100 MG tablet Take 100 mg by mouth in the morning. 02/22/21   [provider]  tamsulosin (FLOMAX) 0.4 MG CAPS capsule Take 1 capsule (0.4 mg total) by mouth daily. 11/10/21   [name], MD  torsemide (DEMADEX) 20 MG tablet Take 120 mg by mouth 2 (two) times daily.  [provider]  traZODone (DESYREL) 150 MG tablet Take 150 mg by mouth at bedtime. 12/09/21   [provider]  Vitamin D, Ergocalciferol, (DRISDOL) 1.25 MG (50000 UNIT) CAPS capsule Take 1 capsule (50,000 Units total) by mouth every 7 (seven) days. 10/02/23   Meredeth Ide, MD      Allergies    Actos [pioglitazone], Dexmedetomidine, Ibuprofen, Tomato, and Wellbutrin [bupropion]    Review of Systems   Review of Systems  Physical Exam Updated Vital Signs BP (!) 152/63 (BP Location: Right Arm)   Pulse 80   Temp 98.3 F (36.8 C)   Resp 16   SpO2 100%  Physical Exam Constitutional:      General: He is not in acute distress.    Appearance: He is not ill-appearing.  Skin:     Coloration: Skin is not pale.  Psychiatric:        Mood and Affect: Mood normal.     ED Results / Procedures / Treatments   Labs (all labs ordered are listed, but only abnormal results are displayed) Labs Reviewed  CBC  RENAL FUNCTION PANEL    EKG None  Radiology No results found.  Procedures Procedures    Medications Ordered in ED Medications  Chlorhexidine Gluconate Cloth 2 % PADS 6 each (has no administration in time range)  pentafluoroprop-tetrafluoroeth (GEBAUERS) aerosol 1 Application (has no administration in time range)  lidocaine (PF) (XYLOCAINE) 1 % injection 5 mL (has no administration in time range)  lidocaine-prilocaine (EMLA) cream 1 Application (has no administration in time range)  feeding supplement (NEPRO CARB STEADY) liquid 237 mL (has no administration in time range)  heparin injection 1,000 Units (has no administration in time range)  anticoagulant sodium citrate solution 5 mL (has no administration in time range)  alteplase (CATHFLO ACTIVASE) injection 2 mg (has no administration in time range)  heparin injection 2,000 Units (has no administration in time range)    ED Course/ Medical Decision Making/ A&P                                 Medical Decision Making Male end-stage renal disease, on dialysis presents with concern for need for dialysis.  Patient has no increased work of breathing, no hypoxia, he is on 3 L per baseline, with saturation 100% which is abnormal, but normal for him.  Absence of complaints, good compliance with dialysis scheduling, reassuring.  Case discussed with nephrology, patient will have dialysis.  Amount and/or Complexity of Data Reviewed External Data Reviewed: notes.  Risk Decision regarding hospitalization. Diagnosis or treatment significantly limited by social determinants of health.  Final Clinical Impression(s) / ED Diagnoses Final diagnoses:  ESRD (end stage renal disease) on dialysis [facility])    Rx / DC  Orders ED Discharge Orders     None         Gerhard Munch, MD 10/10/23 1129

## 2023-10-10 NOTE — ED Notes (Signed)
Patient would like labs to be obtain in dialysis per patient request

## 2023-10-10 NOTE — ED Triage Notes (Signed)
Pt. Here for dialysis. Saturday last treatment completed down to dry weight.

## 2023-10-10 NOTE — ED Notes (Signed)
Pt wanting to leave because it is going to be to late for him and he does not want to stay. His ride stops driving at 5pm. Attempted to call HD and coordinated but he said he had to go because his ride was almost here.

## 2023-10-12 ENCOUNTER — Emergency Department (HOSPITAL_COMMUNITY)
Admission: EM | Admit: 2023-10-12 | Discharge: 2023-10-12 | Disposition: A | Discharge status: Home / Self Care | Payer: 59

## 2023-10-12 ENCOUNTER — Other Ambulatory Visit: Payer: Self-pay

## 2023-10-12 ENCOUNTER — Encounter (HOSPITAL_COMMUNITY): Payer: Self-pay | Admitting: Emergency Medicine

## 2023-10-12 DIAGNOSIS — Z7902 Long term (current) use of antithrombotics/antiplatelets: Secondary | ICD-10-CM | POA: Insufficient documentation

## 2023-10-12 DIAGNOSIS — Z7982 Long term (current) use of aspirin: Secondary | ICD-10-CM | POA: Diagnosis not present

## 2023-10-12 DIAGNOSIS — Z992 Dependence on renal dialysis: Secondary | ICD-10-CM | POA: Diagnosis present

## 2023-10-12 LAB — COMPREHENSIVE METABOLIC PANEL
ALT: 12 U/L (ref 0–44)
AST: 12 U/L — ABNORMAL LOW (ref 15–41)
Albumin: 3.6 g/dL (ref 3.5–5.0)
Alkaline Phosphatase: 84 U/L (ref 38–126)
Anion gap: 11 (ref 5–15)
BUN: 97 mg/dL — ABNORMAL HIGH (ref 8–23)
CO2: 27 mmol/L (ref 22–32)
Calcium: 8.2 mg/dL — ABNORMAL LOW (ref 8.9–10.3)
Chloride: 94 mmol/L — ABNORMAL LOW (ref 98–111)
Creatinine, Ser: 5.8 mg/dL — ABNORMAL HIGH (ref 0.61–1.24)
GFR, Estimated: 10 mL/min — ABNORMAL LOW (ref >60)
Glucose, Bld: 373 mg/dL — ABNORMAL HIGH (ref 70–99)
Potassium: 4.1 mmol/L (ref 3.5–5.1)
Sodium: 132 mmol/L — ABNORMAL LOW (ref 135–145)
Total Bilirubin: 0.5 mg/dL (ref <1.2)
Total Protein: 6.7 g/dL (ref 6.5–8.1)

## 2023-10-12 LAB — CBC
HCT: 30.7 % — ABNORMAL LOW (ref 39.0–52.0)
Hemoglobin: 9.4 g/dL — ABNORMAL LOW (ref 13.0–17.0)
MCH: 28.1 pg (ref 26.0–34.0)
MCHC: 30.6 g/dL (ref 30.0–36.0)
MCV: 91.6 fL (ref 80.0–100.0)
Platelets: 95 10*3/uL — ABNORMAL LOW (ref 150–400)
RBC: 3.35 MIL/uL — ABNORMAL LOW (ref 4.22–5.81)
RDW: 16 % — ABNORMAL HIGH (ref 11.5–15.5)
WBC: 5.4 10*3/uL (ref 4.0–10.5)
nRBC: 0 % (ref 0.0–0.2)

## 2023-10-12 LAB — PHOSPHORUS: Phosphorus: 3.6 mg/dL (ref 2.5–4.6)

## 2023-10-12 LAB — HEPATITIS B SURFACE ANTIGEN: Hepatitis B Surface Ag: NONREACTIVE

## 2023-10-12 MED ORDER — NEPRO/CARBSTEADY PO LIQD: 237.0000 mL | ORAL | Status: DC | PRN

## 2023-10-12 MED ORDER — ALTEPLASE 2 MG IJ SOLR: 2.0000 mg | Freq: Once | INTRAMUSCULAR | Status: DC | PRN

## 2023-10-12 MED ORDER — LIDOCAINE-PRILOCAINE 2.5-2.5 % EX CREA: 1.0000 | TOPICAL_CREAM | CUTANEOUS | Status: DC | PRN

## 2023-10-12 MED ORDER — DIAZEPAM 5 MG PO TABS
5.0000 mg | ORAL_TABLET | Freq: Once | ORAL | Status: AC
  Administered 2023-10-12: 5 mg via ORAL
  Filled 2023-10-12: qty 1

## 2023-10-12 MED ORDER — HEPARIN SODIUM (PORCINE) 1000 UNIT/ML DIALYSIS
2000.0000 [IU] | Freq: Once | INTRAMUSCULAR | Status: AC
Start: 2023-10-12 — End: 2023-10-12
  Administered 2023-10-12: 2000 [IU] via INTRAVENOUS_CENTRAL
  Filled 2023-10-12: qty 2

## 2023-10-12 MED ORDER — ANTICOAGULANT SODIUM CITRATE 4% (200MG/5ML) IV SOLN
5.0000 mL | Status: DC | PRN
Start: 2023-10-12 — End: 2023-10-12

## 2023-10-12 MED ORDER — PENTAFLUOROPROP-TETRAFLUOROETH EX AERO
1.0000 | INHALATION_SPRAY | CUTANEOUS | Status: DC | PRN
Start: 2023-10-12 — End: 2023-10-12

## 2023-10-12 MED ORDER — HEPARIN SODIUM (PORCINE) 1000 UNIT/ML DIALYSIS: 1000.0000 [IU] | INTRAMUSCULAR | Status: DC | PRN

## 2023-10-12 MED ORDER — LIDOCAINE HCL (PF) 1 % IJ SOLN: 5.0000 mL | INTRAMUSCULAR | Status: DC | PRN

## 2023-10-12 MED ORDER — CHLORHEXIDINE GLUCONATE CLOTH 2 % EX PADS: 6.0000 | MEDICATED_PAD | Freq: Every day | CUTANEOUS | Status: DC

## 2023-10-12 NOTE — Progress Notes (Signed)
Received patient in bed to unit.  Alert and oriented.  Informed consent signed and in chart.   TX duration:3.25  Patient tolerated well.  Transported back to the room  Alert, without acute distress.  Hand-off given to patient's nurse.   Access used: avg Access issues: n/a  Total UF removed: 3.5L Medication(s) given: Valium   10/12/23 1247  Vitals  Temp 97.6 F (36.4 C)  Temp Source Oral  BP 127/62  MAP (mmHg) 81  Pulse Rate 92  ECG Heart Rate 97  Resp 19  Oxygen Therapy  SpO2 100 %  O2 Device Nasal Cannula  O2 Flow Rate (L/min) 3 L/min  During Treatment Monitoring  Blood Flow Rate (mL/min) 400 mL/min  Arterial Pressure (mmHg) -187.26 mmHg  Venous Pressure (mmHg) 258.57 mmHg  TMP (mmHg) 24.04 mmHg  Ultrafiltration Rate (mL/min) 1271 mL/min  Dialysate Flow Rate (mL/min) 299 ml/min  Dialysate Potassium Concentration 3  Dialysate Calcium Concentration 2.5  Duration of HD Treatment -hour(s) 3.22 hour(s)  Cumulative Fluid Removed (mL) per Treatment  3462.03  HD Safety Checks Performed Yes  Intra-Hemodialysis Comments Tx completed  Post Treatment  Dialyzer Clearance Lightly streaked  Hemodialysis Intake (mL) 120 mL  Liters Processed 78  Fluid Removed (mL) 3500 mL  Tolerated HD Treatment Yes  Fistula / Graft Left Upper arm Arteriovenous vein graft  Placement Date/Time: (c) 04/22/23 (c) 1000   Orientation: Left  Access Location: Upper arm  Access Type: Arteriovenous vein graft  Site Condition No complications  Fistula / Graft Assessment Present;Thrill;Bruit  Status Deaccessed        [facility]

## 2023-10-12 NOTE — ED Provider Notes (Signed)
Returned from dialysis and is ready to go home.  No acute issues.   Gwyneth Sprout, MD 10/12/23 [phone]

## 2023-10-12 NOTE — Procedures (Addendum)
We were asked to see pt for dialysis. Pt was discharged from his OP HD unit in December 2023 due to behavioral issues. Pt comes to ED today requesting hospital dialysis. The plan will be for "ED HD". Pt will go to the dialysis unit upstairs when they are ready for the pt. When dialysis is completed pt will be sent back to ED for reassessment.      Last OP HD orders (dec 2023):  3:15h  129kg  RUE AVG   Heparin 2000    I have reviewed the HD regimen and made appropriate changes.  Vinson Moselle MD  [facility] 10/12/2023, 2:10 PM

## 2023-10-12 NOTE — ED Notes (Signed)
Patient left prior to discharge papers and vital signs.  Patient in no distress and on his baseline 3L [place]

## 2023-10-12 NOTE — ED Provider Notes (Signed)
[place] EMERGENCY DEPARTMENT AT [facility] Provider Note   CSN: [id number] Arrival date & time: 10/12/23  0725     History  Chief Complaint  Patient presents with   Dialysis    Jeffrey [name] is a 62 y.o. male who presents today for need of dialysis.  Patient last dialyzed 2 days ago per schedule.  The patient has no complaints, alert and oriented.  HPI     Home Medications Prior to Admission medications   Medication Sig Start Date End Date Taking? Authorizing Provider  acetaminophen (TYLENOL) 325 MG tablet Take 2 tablets (650 mg total) by mouth every 6 (six) hours. 07/23/23   Lockie Mola, MD  amLODipine (NORVASC) 10 MG tablet Take 10 mg by mouth every morning. 04/17/20   [provider]  ASPIRIN LOW DOSE 81 MG EC tablet Take 81 mg by mouth in the morning. 02/22/21   [provider]  atorvastatin (LIPITOR) 40 MG tablet Take 40 mg by mouth in the morning. 02/09/21   [provider]  calcium acetate (PHOSLO) 667 MG capsule Take 2 capsules (1,334 mg total) by mouth with breakfast, with lunch, and with evening meal. 04/10/21   Calvert Cantor, MD  carvedilol (COREG) 12.5 MG tablet Take 12.5 mg by mouth 2 (two) times daily with a meal. 04/17/20   [provider]  clopidogrel (PLAVIX) 75 MG tablet Take 1 tablet (75 mg total) by mouth daily. 11/02/22   Rhyne, Ames Coupe, PA-C  diazepam (VALIUM) 5 MG tablet Take 5 mg by mouth 2 (two) times daily as needed for muscle spasms. 12/09/21   [provider]  Fluticasone-Umeclidin-Vilant (TRELEGY ELLIPTA IN) Inhale 1 puff into the lungs daily.    [provider]  insulin lispro (HUMALOG KWIKPEN) 100 UNIT/ML KwikPen Inject 0-9 Units into the skin 3 (three) times daily with meals. CBG < 70: treat low blood sugar CBG 70 - 120: 0 units  CBG 121 - 150: 1 unit  CBG 151 - 200: 2 units  CBG 201 - 250: 3 units  CBG 251 - 300: 5 units  CBG 301 - 350: 7 units  CBG 351 - 400: 9 units  CBG >  400: call MD 09/27/23   Zigmund Daniel., MD  insulin regular human CONCENTRATED (HUMULIN R U-500 KWIKPEN) 500 UNIT/ML KwikPen Inject 55 Units into the skin 2 (two) times daily with a meal. Follow up with your PCP and [facility] for additional adjustments 09/27/23 10/27/23  Zigmund Daniel., MD  iron polysaccharides (NIFEREX) 150 MG capsule Take 1 capsule (150 mg total) by mouth daily. 09/26/23   Meredeth Ide, MD  nortriptyline (PAMELOR) 10 MG capsule Take 20 mg by mouth at bedtime.    [provider]  pantoprazole (PROTONIX) 40 MG tablet Take 40 mg by mouth daily before breakfast. 02/09/21   [provider]  pregabalin (LYRICA) 75 MG capsule Take 1 capsule (75 mg total) by mouth See admin instructions. Daily.  Give after dialysis on dialysis days. Patient taking differently: Take 75 mg by mouth daily. 01/21/23   [name], MD  sertraline (ZOLOFT) 100 MG tablet Take 100 mg by mouth in the morning. 02/22/21   [provider]  tamsulosin (FLOMAX) 0.4 MG CAPS capsule Take 1 capsule (0.4 mg total) by mouth daily. 11/10/21   [name], MD  torsemide (DEMADEX) 20 MG tablet Take 120 mg by mouth 2 (two) times daily.    [provider]  traZODone (  DESYREL) 150 MG tablet Take 150 mg by mouth at bedtime. 12/09/21   [provider]  Vitamin D, Ergocalciferol, (DRISDOL) 1.25 MG (50000 UNIT) CAPS capsule Take 1 capsule (50,000 Units total) by mouth every 7 (seven) days. 10/02/23   Meredeth Ide, MD      Allergies    Actos [pioglitazone], Dexmedetomidine, Ibuprofen, Tomato, and Wellbutrin [bupropion]    Review of Systems   Review of Systems  All other systems reviewed and are negative.   Physical Exam Updated Vital Signs BP (!) 158/68 (BP Location: Right Arm)   Pulse 86   Temp 98.6 F (37 C) (Oral)   Resp 18   SpO2 100%  Physical Exam Vitals and nursing note reviewed.  Constitutional:      General: He is not in acute distress.     Appearance: He is not toxic-appearing.  HENT:     Head: Normocephalic and atraumatic.  Pulmonary:     Effort: No respiratory distress.  Skin:    Coloration: Skin is not jaundiced or pale.  Neurological:     Mental Status: He is alert and oriented to person, place, and time.  Psychiatric:        Behavior: Behavior normal.     ED Results / Procedures / Treatments   Labs (all labs ordered are listed, but only abnormal results are displayed) Labs Reviewed  CBC  COMPREHENSIVE METABOLIC PANEL  PHOSPHORUS  HEPATITIS B SURFACE ANTIGEN    EKG None  Radiology No results found.  Procedures Procedures   Medications Ordered in ED Medications  Chlorhexidine Gluconate Cloth 2 % PADS 6 each (has no administration in time range)  pentafluoroprop-tetrafluoroeth (GEBAUERS) aerosol 1 Application (has no administration in time range)  lidocaine (PF) (XYLOCAINE) 1 % injection 5 mL (has no administration in time range)  lidocaine-prilocaine (EMLA) cream 1 Application (has no administration in time range)  feeding supplement (NEPRO CARB STEADY) liquid 237 mL (has no administration in time range)  heparin injection 1,000 Units (has no administration in time range)  anticoagulant sodium citrate solution 5 mL (has no administration in time range)  alteplase (CATHFLO ACTIVASE) injection 2 mg (has no administration in time range)  heparin injection 2,000 Units (has no administration in time range)    ED Course/ Medical Decision Making/ A&P  Medical Decision Making Amount and/or Complexity of Data Reviewed Labs: ordered.   62 year old male presents for dialysis.  Please see HPI for further details.  Pleasant gentleman in no apparent distress.  Has no complaints.  Here for scheduled dialysis.  Have consulted nephrology.  Have ordered labs.  Spoke with Dr. Arlean Hopping who states patient will be taken upstairs for dialysis at some point today.   Final Clinical Impression(s) / ED  Diagnoses Final diagnoses:  Dialysis patient [facility])    Rx / DC Orders ED Discharge Orders     None         Clent Ridges 10/12/23 2440    Durwin Glaze, MD 10/12/23 1520

## 2023-10-12 NOTE — ED Triage Notes (Signed)
Pt reports "I am here for my regular dialysis." No complaints.

## 2023-10-14 ENCOUNTER — Encounter (HOSPITAL_COMMUNITY): Payer: Self-pay | Admitting: Emergency Medicine

## 2023-10-14 ENCOUNTER — Emergency Department (HOSPITAL_COMMUNITY)
Admission: EM | Admit: 2023-10-14 | Discharge: 2023-10-14 | Disposition: A | Discharge status: Home / Self Care | Payer: 59 | Attending: Emergency Medicine | Admitting: Emergency Medicine

## 2023-10-14 ENCOUNTER — Other Ambulatory Visit: Payer: Self-pay

## 2023-10-14 DIAGNOSIS — J449 Chronic obstructive pulmonary disease, unspecified: Secondary | ICD-10-CM | POA: Insufficient documentation

## 2023-10-14 DIAGNOSIS — N186 End stage renal disease: Secondary | ICD-10-CM | POA: Diagnosis present

## 2023-10-14 DIAGNOSIS — Z992 Dependence on renal dialysis: Secondary | ICD-10-CM | POA: Insufficient documentation

## 2023-10-14 DIAGNOSIS — Z794 Long term (current) use of insulin: Secondary | ICD-10-CM | POA: Insufficient documentation

## 2023-10-14 DIAGNOSIS — Z7982 Long term (current) use of aspirin: Secondary | ICD-10-CM | POA: Diagnosis not present

## 2023-10-14 DIAGNOSIS — Z79899 Other long term (current) drug therapy: Secondary | ICD-10-CM | POA: Diagnosis not present

## 2023-10-14 DIAGNOSIS — I120 Hypertensive chronic kidney disease with stage 5 chronic kidney disease or end stage renal disease: Secondary | ICD-10-CM | POA: Diagnosis not present

## 2023-10-14 MED ORDER — ANTICOAGULANT SODIUM CITRATE 4% (200MG/5ML) IV SOLN
5.0000 mL | Status: DC | PRN
  Filled 2023-10-14: qty 5

## 2023-10-14 MED ORDER — CHLORHEXIDINE GLUCONATE CLOTH 2 % EX PADS: 6.0000 | MEDICATED_PAD | Freq: Every day | CUTANEOUS | Status: DC

## 2023-10-14 MED ORDER — ALTEPLASE 2 MG IJ SOLR: 2.0000 mg | Freq: Once | INTRAMUSCULAR | Status: DC | PRN

## 2023-10-14 MED ORDER — HEPARIN SODIUM (PORCINE) 1000 UNIT/ML DIALYSIS: 1000.0000 [IU] | INTRAMUSCULAR | Status: DC | PRN

## 2023-10-14 MED ORDER — HEPARIN SODIUM (PORCINE) 1000 UNIT/ML DIALYSIS
2000.0000 [IU] | Freq: Once | INTRAMUSCULAR | Status: DC
  Filled 2023-10-14: qty 2

## 2023-10-14 NOTE — ED Triage Notes (Signed)
Pt here for regular dialysis treatment. No complaints. No missed sessions per patient.

## 2023-10-14 NOTE — ED Notes (Signed)
Patient waiting on dialysis no complaints

## 2023-10-14 NOTE — ED Notes (Signed)
Called Dialysis states they are setting up the machine, patient continues to wait.

## 2023-10-14 NOTE — ED Provider Notes (Signed)
[place] EMERGENCY DEPARTMENT AT [facility] Provider Note   CSN: [id number] Arrival date & time: 10/14/23  4098     History  Chief Complaint  Patient presents with   Needs Dialysis    Jeffrey [name] is a 62 y.o. male with past medical history significant for hypertension, ESRD on dialysis, COPD who presents with need for his normal Tuesday, Thursday, Saturday dialysis.  Patient reports that he is currently getting his regular dialysis always in the hospital, he says that it is complicated why he cannot get established with a dialysis clinic outpatient, but he is well-known to this department and was dialyzed here just 2 days ago.  He has no acute complaints at this time, denies any nausea, vomiting, shortness of breath, new leg swelling.  HPI     Home Medications Prior to Admission medications   Medication Sig Start Date End Date Taking? Authorizing Provider  acetaminophen (TYLENOL) 325 MG tablet Take 2 tablets (650 mg total) by mouth every 6 (six) hours. 07/23/23   Lockie Mola, MD  amLODipine (NORVASC) 10 MG tablet Take 10 mg by mouth every morning. 04/17/20   [provider]  ASPIRIN LOW DOSE 81 MG EC tablet Take 81 mg by mouth in the morning. 02/22/21   [provider]  atorvastatin (LIPITOR) 40 MG tablet Take 40 mg by mouth in the morning. 02/09/21   [provider]  calcium acetate (PHOSLO) 667 MG capsule Take 2 capsules (1,334 mg total) by mouth with breakfast, with lunch, and with evening meal. 04/10/21   Calvert Cantor, MD  carvedilol (COREG) 12.5 MG tablet Take 12.5 mg by mouth 2 (two) times daily with a meal. 04/17/20   [provider]  clopidogrel (PLAVIX) 75 MG tablet Take 1 tablet (75 mg total) by mouth daily. 11/02/22   Rhyne, Ames Coupe, PA-C  diazepam (VALIUM) 5 MG tablet Take 5 mg by mouth 2 (two) times daily as needed for muscle spasms. 12/09/21   [provider]  Fluticasone-Umeclidin-Vilant (TRELEGY ELLIPTA IN)  Inhale 1 puff into the lungs daily.    [provider]  insulin lispro (HUMALOG KWIKPEN) 100 UNIT/ML KwikPen Inject 0-9 Units into the skin 3 (three) times daily with meals. CBG < 70: treat low blood sugar CBG 70 - 120: 0 units  CBG 121 - 150: 1 unit  CBG 151 - 200: 2 units  CBG 201 - 250: 3 units  CBG 251 - 300: 5 units  CBG 301 - 350: 7 units  CBG 351 - 400: 9 units  CBG > 400: call MD 09/27/23   Zigmund Daniel., MD  insulin regular human CONCENTRATED (HUMULIN R U-500 KWIKPEN) 500 UNIT/ML KwikPen Inject 55 Units into the skin 2 (two) times daily with a meal. Follow up with your PCP and [facility] for additional adjustments 09/27/23 10/27/23  Zigmund Daniel., MD  iron polysaccharides (NIFEREX) 150 MG capsule Take 1 capsule (150 mg total) by mouth daily. 09/26/23   Meredeth Ide, MD  nortriptyline (PAMELOR) 10 MG capsule Take 20 mg by mouth at bedtime.    [provider]  pantoprazole (PROTONIX) 40 MG tablet Take 40 mg by mouth daily before breakfast. 02/09/21   [provider]  pregabalin (LYRICA) 75 MG capsule Take 1 capsule (75 mg total) by mouth See admin instructions. Daily.  Give after dialysis on dialysis days. Patient taking differently: Take 75 mg by mouth daily. 01/21/23   [name], MD  sertraline (  ZOLOFT) 100 MG tablet Take 100 mg by mouth in the morning. 02/22/21   [provider]  tamsulosin (FLOMAX) 0.4 MG CAPS capsule Take 1 capsule (0.4 mg total) by mouth daily. 11/10/21   [name], MD  torsemide (DEMADEX) 20 MG tablet Take 120 mg by mouth 2 (two) times daily.    [provider]  traZODone (DESYREL) 150 MG tablet Take 150 mg by mouth at bedtime. 12/09/21   [provider]  Vitamin D, Ergocalciferol, (DRISDOL) 1.25 MG (50000 UNIT) CAPS capsule Take 1 capsule (50,000 Units total) by mouth every 7 (seven) days. 10/02/23   Meredeth Ide, MD      Allergies    Actos [pioglitazone], Dexmedetomidine, Ibuprofen,  Tomato, and Wellbutrin [bupropion]    Review of Systems   Review of Systems  All other systems reviewed and are negative.   Physical Exam Updated Vital Signs BP (!) 130/109 (BP Location: Right Wrist)   Pulse 84   Temp 98.4 F (36.9 C)   Resp 18   Ht 5\' 5"  (1.651 m)   Wt 45.8 kg   SpO2 99%   BMI 16.80 kg/m  Physical Exam Vitals and nursing note reviewed.  Constitutional:      General: He is not in acute distress.    Appearance: Normal appearance. He is obese.     Comments: Chronically ill-appearing at baseline but in no acute distress at this time  HENT:     Head: Normocephalic and atraumatic.  Eyes:     General:        Right eye: No discharge.        Left eye: No discharge.  Cardiovascular:     Rate and Rhythm: Normal rate and regular rhythm.     Heart sounds: No murmur heard.    No friction rub. No gallop.  Pulmonary:     Effort: Pulmonary effort is normal.     Breath sounds: Normal breath sounds.  Abdominal:     General: Bowel sounds are normal.     Palpations: Abdomen is soft.  Musculoskeletal:     Comments: Left AKA  Skin:    General: Skin is warm and dry.     Capillary Refill: Capillary refill takes less than 2 seconds.  Neurological:     Mental Status: He is alert and oriented to person, place, and time.  Psychiatric:        Mood and Affect: Mood normal.        Behavior: Behavior normal.     ED Results / Procedures / Treatments   Labs (all labs ordered are listed, but only abnormal results are displayed) Labs Reviewed - No data to display  EKG None  Radiology No results found.  Procedures Procedures    Medications Ordered in ED Medications - No data to display  ED Course/ Medical Decision Making/ A&P                                 Medical Decision Making  This patient is a 62 y.o. male who presents to the ED for concern of need for dialysis, patient regular Tuesday Thursday Saturday dialysis patient.   Differential diagnoses prior  to evaluation: Electrolyte abnormality, fluid overload, versus normal need for dialysis without complication  Past Medical History / Social History / Additional history: Chart reviewed. Pertinent results include: Reviewed extensive ED visits, lab work, imaging from his recent previous emergency department visit,  he had some mild hyponatremia at that time, sodium 132 as well as hyperglycemia, pseudohyponatremia in context of his hyperglycemia, he has anemia of chronic disease, hemoglobin 9.4, overall stable compared to his baseline.  Physical Exam: Physical exam performed. The pertinent findings include: [place] ill-appearing but in no acute distress, somewhat hypertensive, blood pressure 130/109.  Disposition: Consulted with the nephrologist, Dr. Arlean Hopping, who will take patient to dialysis, anticipate that he will be discharged after dialysis. Patient appropriate for dialysis and discharge after complete.  emergency department workup does not suggest an emergent condition requiring admission or immediate intervention beyond what has been performed at this time. The plan is: as above. The patient is safe for discharge and has been instructed to return immediately for worsening symptoms, change in symptoms or any other concerns.  Final Clinical Impression(s) / ED Diagnoses Final diagnoses:  None    Rx / DC Orders ED Discharge Orders     None         [place] 10/14/23 1914    Wynetta Fines, MD 10/31/23 [phone]

## 2023-10-14 NOTE — ED Notes (Signed)
Patient sitting in wheelchair  , waiting on dialysis

## 2023-10-14 NOTE — ED Notes (Signed)
Patient left wasn't waiting any longer states his ride was here , called dialysis made aware.

## 2023-10-17 ENCOUNTER — Emergency Department (HOSPITAL_COMMUNITY)
Admission: EM | Admit: 2023-10-17 | Discharge: 2023-10-17 | Disposition: A | Discharge status: Home / Self Care | Payer: 59 | Attending: Nephrology | Admitting: Nephrology

## 2023-10-17 ENCOUNTER — Other Ambulatory Visit: Payer: Self-pay

## 2023-10-17 DIAGNOSIS — Z79899 Other long term (current) drug therapy: Secondary | ICD-10-CM | POA: Diagnosis not present

## 2023-10-17 DIAGNOSIS — N186 End stage renal disease: Secondary | ICD-10-CM | POA: Insufficient documentation

## 2023-10-17 DIAGNOSIS — Z794 Long term (current) use of insulin: Secondary | ICD-10-CM | POA: Insufficient documentation

## 2023-10-17 DIAGNOSIS — Z992 Dependence on renal dialysis: Secondary | ICD-10-CM | POA: Diagnosis not present

## 2023-10-17 DIAGNOSIS — Z7902 Long term (current) use of antithrombotics/antiplatelets: Secondary | ICD-10-CM | POA: Diagnosis not present

## 2023-10-17 DIAGNOSIS — R739 Hyperglycemia, unspecified: Secondary | ICD-10-CM | POA: Insufficient documentation

## 2023-10-17 DIAGNOSIS — D649 Anemia, unspecified: Secondary | ICD-10-CM | POA: Insufficient documentation

## 2023-10-17 DIAGNOSIS — D631 Anemia in chronic kidney disease: Secondary | ICD-10-CM | POA: Insufficient documentation

## 2023-10-17 LAB — CBC WITH DIFFERENTIAL/PLATELET
Abs Immature Granulocytes: 0.02 10*3/uL (ref 0.00–0.07)
Basophils Absolute: 0 10*3/uL (ref 0.0–0.1)
Basophils Relative: 0 %
Eosinophils Absolute: 0.1 10*3/uL (ref 0.0–0.5)
Eosinophils Relative: 2 %
HCT: 32.6 % — ABNORMAL LOW (ref 39.0–52.0)
Hemoglobin: 10 g/dL — ABNORMAL LOW (ref 13.0–17.0)
Immature Granulocytes: 0 %
Lymphocytes Relative: 9 %
Lymphs Abs: 0.6 10*3/uL — ABNORMAL LOW (ref 0.7–4.0)
MCH: 28.2 pg (ref 26.0–34.0)
MCHC: 30.7 g/dL (ref 30.0–36.0)
MCV: 92.1 fL (ref 80.0–100.0)
Monocytes Absolute: 0.4 10*3/uL (ref 0.1–1.0)
Monocytes Relative: 6 %
Neutro Abs: 5 10*3/uL (ref 1.7–7.7)
Neutrophils Relative %: 83 %
Platelets: 120 10*3/uL — ABNORMAL LOW (ref 150–400)
RBC: 3.54 MIL/uL — ABNORMAL LOW (ref 4.22–5.81)
RDW: 15.3 % (ref 11.5–15.5)
WBC: 6.1 10*3/uL (ref 4.0–10.5)
nRBC: 0 % (ref 0.0–0.2)

## 2023-10-17 LAB — BASIC METABOLIC PANEL
Anion gap: 13 (ref 5–15)
BUN: 83 mg/dL — ABNORMAL HIGH (ref 8–23)
CO2: 25 mmol/L (ref 22–32)
Calcium: 8.3 mg/dL — ABNORMAL LOW (ref 8.9–10.3)
Chloride: 98 mmol/L (ref 98–111)
Creatinine, Ser: 4.54 mg/dL — ABNORMAL HIGH (ref 0.61–1.24)
GFR, Estimated: 14 mL/min — ABNORMAL LOW (ref >60)
Glucose, Bld: 279 mg/dL — ABNORMAL HIGH (ref 70–99)
Potassium: 4.1 mmol/L (ref 3.5–5.1)
Sodium: 136 mmol/L (ref 135–145)

## 2023-10-17 MED ORDER — LIDOCAINE HCL (PF) 1 % IJ SOLN
5.0000 mL | INTRAMUSCULAR | Status: DC | PRN
Start: 2023-10-17 — End: 2023-10-17

## 2023-10-17 MED ORDER — DARBEPOETIN ALFA 200 MCG/0.4ML IJ SOSY
200.0000 ug | PREFILLED_SYRINGE | Freq: Once | INTRAMUSCULAR | Status: AC
  Administered 2023-10-17: 200 ug via SUBCUTANEOUS
  Filled 2023-10-17: qty 0.4

## 2023-10-17 MED ORDER — NEPRO/CARBSTEADY PO LIQD: 237.0000 mL | ORAL | Status: DC | PRN

## 2023-10-17 MED ORDER — ANTICOAGULANT SODIUM CITRATE 4% (200MG/5ML) IV SOLN
5.0000 mL | Status: DC | PRN
Start: 2023-10-17 — End: 2023-10-17

## 2023-10-17 MED ORDER — LIDOCAINE-PRILOCAINE 2.5-2.5 % EX CREA: 1.0000 | TOPICAL_CREAM | CUTANEOUS | Status: DC | PRN

## 2023-10-17 MED ORDER — PENTAFLUOROPROP-TETRAFLUOROETH EX AERO
1.0000 | INHALATION_SPRAY | CUTANEOUS | Status: DC | PRN
Start: 2023-10-17 — End: 2023-10-17

## 2023-10-17 MED ORDER — ALTEPLASE 2 MG IJ SOLR: 2.0000 mg | Freq: Once | INTRAMUSCULAR | Status: DC | PRN

## 2023-10-17 MED ORDER — HEPARIN SODIUM (PORCINE) 1000 UNIT/ML DIALYSIS: 2000.0000 [IU] | Freq: Once | INTRAMUSCULAR | Status: DC

## 2023-10-17 MED ORDER — CHLORHEXIDINE GLUCONATE CLOTH 2 % EX PADS: 6.0000 | MEDICATED_PAD | Freq: Every day | CUTANEOUS | Status: DC

## 2023-10-17 MED ORDER — HEPARIN SODIUM (PORCINE) 1000 UNIT/ML DIALYSIS: 1000.0000 [IU] | INTRAMUSCULAR | Status: DC | PRN

## 2023-10-17 NOTE — Progress Notes (Signed)
   10/17/23 1430  Vitals  Temp 97.7 F (36.5 C)  Pulse Rate 86  Resp 13  BP (!) 142/92  SpO2 100 %  O2 Device Nasal Cannula  Oxygen Therapy  O2 Flow Rate (L/min) 3 L/min  Patient Activity (if Appropriate) In bed  Pulse Oximetry Type Continuous  Post Treatment  Dialyzer Clearance Clear  Hemodialysis Intake (mL) 0 mL  Liters Processed 78  Fluid Removed (mL) 3500 mL  Tolerated HD Treatment Yes  Post-Hemodialysis Comments Pt. tolerated HD tx without difficulties. Report called to ED Charge RN Whitney  AVG/AVF Arterial Site Held (minutes) 5 minutes  AVG/AVF Venous Site Held (minutes) 5 minutes  Duration of HD Treatment -hour(s) 3.01 hour(s)

## 2023-10-17 NOTE — ED Provider Notes (Signed)
[place] EMERGENCY DEPARTMENT AT [facility] Provider Note   CSN: [id number] Arrival date & time: 10/17/23  4098     History  Chief Complaint  Patient presents with   Vascular Access Problem    Jeffrey [name] is a 62 y.o. male who is well-known to this department with ESRD on dialysis, he presents today for his normal dialysis, he has no acute complaints.  HPI     Home Medications Prior to Admission medications   Medication Sig Start Date End Date Taking? Authorizing Provider  acetaminophen (TYLENOL) 325 MG tablet Take 2 tablets (650 mg total) by mouth every 6 (six) hours. 07/23/23   Lockie Mola, MD  amLODipine (NORVASC) 10 MG tablet Take 10 mg by mouth every morning. 04/17/20   [provider]  ASPIRIN LOW DOSE 81 MG EC tablet Take 81 mg by mouth in the morning. 02/22/21   [provider]  atorvastatin (LIPITOR) 40 MG tablet Take 40 mg by mouth in the morning. 02/09/21   [provider]  calcium acetate (PHOSLO) 667 MG capsule Take 2 capsules (1,334 mg total) by mouth with breakfast, with lunch, and with evening meal. 04/10/21   Calvert Cantor, MD  carvedilol (COREG) 12.5 MG tablet Take 12.5 mg by mouth 2 (two) times daily with a meal. 04/17/20   [provider]  clopidogrel (PLAVIX) 75 MG tablet Take 1 tablet (75 mg total) by mouth daily. 11/02/22   Rhyne, Ames Coupe, PA-C  diazepam (VALIUM) 5 MG tablet Take 5 mg by mouth 2 (two) times daily as needed for muscle spasms. 12/09/21   [provider]  Fluticasone-Umeclidin-Vilant (TRELEGY ELLIPTA IN) Inhale 1 puff into the lungs daily.    [provider]  insulin lispro (HUMALOG KWIKPEN) 100 UNIT/ML KwikPen Inject 0-9 Units into the skin 3 (three) times daily with meals. CBG < 70: treat low blood sugar CBG 70 - 120: 0 units  CBG 121 - 150: 1 unit  CBG 151 - 200: 2 units  CBG 201 - 250: 3 units  CBG 251 - 300: 5 units  CBG 301 - 350: 7 units  CBG 351 - 400: 9 units  CBG  > 400: call MD 09/27/23   Zigmund Daniel., MD  insulin regular human CONCENTRATED (HUMULIN R U-500 KWIKPEN) 500 UNIT/ML KwikPen Inject 55 Units into the skin 2 (two) times daily with a meal. Follow up with your PCP and [facility] for additional adjustments 09/27/23 10/27/23  Zigmund Daniel., MD  iron polysaccharides (NIFEREX) 150 MG capsule Take 1 capsule (150 mg total) by mouth daily. 09/26/23   Meredeth Ide, MD  nortriptyline (PAMELOR) 10 MG capsule Take 20 mg by mouth at bedtime.    [provider]  pantoprazole (PROTONIX) 40 MG tablet Take 40 mg by mouth daily before breakfast. 02/09/21   [provider]  pregabalin (LYRICA) 75 MG capsule Take 1 capsule (75 mg total) by mouth See admin instructions. Daily.  Give after dialysis on dialysis days. Patient taking differently: Take 75 mg by mouth daily. 01/21/23   [name], MD  sertraline (ZOLOFT) 100 MG tablet Take 100 mg by mouth in the morning. 02/22/21   [provider]  tamsulosin (FLOMAX) 0.4 MG CAPS capsule Take 1 capsule (0.4 mg total) by mouth daily. 11/10/21   [name], MD  torsemide (DEMADEX) 20 MG tablet Take 120 mg by mouth 2 (two) times daily.    [provider]  traZODone (DESYREL)  150 MG tablet Take 150 mg by mouth at bedtime. 12/09/21   [provider]  Vitamin D, Ergocalciferol, (DRISDOL) 1.25 MG (50000 UNIT) CAPS capsule Take 1 capsule (50,000 Units total) by mouth every 7 (seven) days. 10/02/23   Meredeth Ide, MD      Allergies    Actos [pioglitazone], Dexmedetomidine, Ibuprofen, Tomato, and Wellbutrin [bupropion]    Review of Systems   Review of Systems  All other systems reviewed and are negative.   Physical Exam Updated Vital Signs BP (!) 142/92 (BP Location: Right Wrist)   Pulse 86   Temp 97.7 F (36.5 C) (Oral)   Resp 13   Ht 5\' 5"  (1.651 m)   Wt (S) (!) 140.8 kg Comment: Bed Scale  SpO2 100%   BMI 51.65 kg/m  Physical Exam Vitals and nursing  note reviewed.  Constitutional:      General: He is not in acute distress.    Appearance: Normal appearance. He is obese.     Comments: Chronically ill-appearing but in no acute distress  HENT:     Head: Normocephalic and atraumatic.  Eyes:     General:        Right eye: No discharge.        Left eye: No discharge.  Cardiovascular:     Rate and Rhythm: Normal rate and regular rhythm.     Heart sounds: No murmur heard.    No friction rub. No gallop.  Pulmonary:     Effort: Pulmonary effort is normal.     Breath sounds: Normal breath sounds.  Abdominal:     General: Bowel sounds are normal.     Palpations: Abdomen is soft.  Musculoskeletal:     Comments: Left-sided AKA  Skin:    General: Skin is warm and dry.     Capillary Refill: Capillary refill takes less than 2 seconds.  Neurological:     Mental Status: He is alert and oriented to person, place, and time.  Psychiatric:        Mood and Affect: Mood normal.        Behavior: Behavior normal.     ED Results / Procedures / Treatments   Labs (all labs ordered are listed, but only abnormal results are displayed) Labs Reviewed  CBC WITH DIFFERENTIAL/PLATELET - Abnormal; Notable for the following components:      Result Value   RBC 3.54 (*)    Hemoglobin 10.0 (*)    HCT 32.6 (*)    Platelets 120 (*)    Lymphs Abs 0.6 (*)    All other components within normal limits  BASIC METABOLIC PANEL - Abnormal; Notable for the following components:   Glucose, Bld 279 (*)    BUN 83 (*)    Creatinine, Ser 4.54 (*)    Calcium 8.3 (*)    GFR, Estimated 14 (*)    All other components within normal limits  HEPATITIS B SURFACE ANTIGEN  HEPATITIS B SURFACE ANTIBODY, QUANTITATIVE  CBC  RENAL FUNCTION PANEL    EKG None  Radiology No results found.  Procedures Procedures    Medications Ordered in ED Medications  Chlorhexidine Gluconate Cloth 2 % PADS 6 each (has no administration in time range)  Darbepoetin Alfa (ARANESP)  injection 200 mcg (200 mcg Subcutaneous Given 10/17/23 1546)    ED Course/ Medical Decision Making/ A&P  Medical Decision Making  This patient is a 62 y.o. male who presents to the ED for concern of need for dialysis, patient regular Tuesday Thursday Saturday dialysis patient.    Differential diagnoses prior to evaluation: Electrolyte abnormality, fluid overload, versus normal need for dialysis without complication   Past Medical History / Social History / Additional history: Chart reviewed. Pertinent results include: Reviewed extensive ED visits, lab work, imaging from his recent previous emergency department visits   Physical Exam: Physical exam performed. The pertinent findings include: [place] ill-appearing but in no acute distress, somewhat hypertensive, blood pressure 142/92   BMP notable for hyperglycemia, glucose 279, elevated BUN, creatinine with signs symptoms of ESRD, he has some anemia of chronic disease on CBC, hemoglobin 10, not significantly changed from baseline.  Disposition: Patient taken to dialysis, completed dialysis and is stable for discharge at this time   emergency department workup does not suggest an emergent condition requiring admission or immediate intervention beyond what has been performed at this time. The plan is: as above. The patient is safe for discharge and has been instructed to return immediately for worsening symptoms, change in symptoms or any other concerns.   Final Clinical Impression(s) / ED Diagnoses Final diagnoses:  ESRD (end stage renal disease) [facility])    Rx / DC Orders ED Discharge Orders     None         Olene Floss, PA-C 10/17/23 1552    Rondel Baton, MD 10/21/23 [phone]

## 2023-10-17 NOTE — Procedures (Addendum)
We were asked to see this patient for dialysis. Pt was discharged from his OP HD unit in December 2023 due to behavioral issues. Pt came to ED today requesting hospital dialysis. The plan will be for "ED HD". Patient is not being admitted. Pt will go to the dialysis unit upstairs when they are ready for the pt. When dialysis is completed pt will be sent back to ED for reassessment.      Last OP HD orders (dec 2023):  3:15h  129kg  RUE AVG   Heparin 2000       Problems: Anemia of esrd:  Pt had darbe in late April and in early May, but Hb continued to drop, so dosing of darbepoeitin was increased up to 100 and then up to 150 mcg q 1-2 wks.  Pt also required two 1 gm IV Fe loading programs during the summer and early fall to get the Hb to respond. In Sept / Oct 2024, Hb has been dropping down back into the 8's. Plan: Cont darbepoetin 200 mcg every 1-2 wks in hospital (pt does not have outpatient unit to get this medication). Last dose was on 10/20, so overdue for esa. Last Hb we have is 9.4 on 11/14.  Ordering 200 mcg sq x 1 today.  Also, darbepoeitin needs to ordered for this patient as "once in dialysis" and not "SQ at 1800" because he needs to be ready to leave at 5 pm to catch his ride home.    I have reviewed the HD regimen and made appropriate changes.  Vinson Moselle MD  [facility] 10/17/2023, 1:00 PM

## 2023-10-17 NOTE — ED Triage Notes (Signed)
Pt. Here for dialysis. Missed SAturday didn't get him upstairs before He had to leave.

## 2023-10-17 NOTE — ED Notes (Signed)
Report given to dialysis.   

## 2023-10-17 NOTE — Progress Notes (Signed)
   10/17/23 1430  Vitals  Temp 97.7 F (36.5 C)  Pulse Rate 86  Resp 13  BP (!) 142/92  SpO2 100 %  O2 Device Nasal Cannula  Oxygen Therapy  O2 Flow Rate (L/min) 3 L/min  Patient Activity (if Appropriate) In bed  Pulse Oximetry Type Continuous  During Treatment Monitoring  Blood Flow Rate (mL/min) 399 mL/min  Arterial Pressure (mmHg) -177.16 mmHg  Venous Pressure (mmHg) 278.77 mmHg  TMP (mmHg) 23.23 mmHg  Ultrafiltration Rate (mL/min) 1250 mL/min  Dialysate Flow Rate (mL/min) 300 ml/min  Duration of HD Treatment -hour(s) 3.01 hour(s)  Cumulative Fluid Removed (mL) per Treatment  3205.36  HD Safety Checks Performed Yes  Intra-Hemodialysis Comments Tx completed   Received patient in bed to unit.  Alert and oriented.  Informed consent signed and in chart.   TX duration:3.25  Patient tolerated well.  Transported back to the room  Alert, without acute distress.  Hand-off given to patient's nurse.   Access used: Yes Access issues: No  Total UF removed: 3500 Medication(s) given: See MAR Post HD VS: See Above Grid Post HD weight: No Weight   [facility]

## 2023-10-18 NOTE — Progress Notes (Signed)
Late Note Entry- Nov. 20, 2024  Navigator advised yesterday that [facility] is unable to accept pt back at any [facility] clinic. Referral submitted to [facility] admissions this morning for review to see if [facility] clinic in [place] or [place] would be willing to accept pt. Nephrologist provided a letter of compliance regarding pt coming to ED on regular basis since d/c from clinic. Letter submitted with pt's referral. Will await determination from [facility].   Olivia Canter Renal Navigator [phone]

## 2023-10-19 ENCOUNTER — Emergency Department (HOSPITAL_COMMUNITY)
Admission: EM | Admit: 2023-10-19 | Discharge: 2023-10-19 | Disposition: A | Discharge status: Home / Self Care | Payer: 59 | Attending: Emergency Medicine | Admitting: Emergency Medicine

## 2023-10-19 ENCOUNTER — Other Ambulatory Visit: Payer: Self-pay

## 2023-10-19 ENCOUNTER — Encounter (HOSPITAL_COMMUNITY): Payer: Self-pay

## 2023-10-19 DIAGNOSIS — Z7982 Long term (current) use of aspirin: Secondary | ICD-10-CM | POA: Insufficient documentation

## 2023-10-19 DIAGNOSIS — Z992 Dependence on renal dialysis: Secondary | ICD-10-CM | POA: Diagnosis not present

## 2023-10-19 DIAGNOSIS — N186 End stage renal disease: Secondary | ICD-10-CM | POA: Insufficient documentation

## 2023-10-19 LAB — RENAL FUNCTION PANEL
Albumin: 3.7 g/dL (ref 3.5–5.0)
Anion gap: 12 (ref 5–15)
BUN: 67 mg/dL — ABNORMAL HIGH (ref 8–23)
CO2: 25 mmol/L (ref 22–32)
Calcium: 8.6 mg/dL — ABNORMAL LOW (ref 8.9–10.3)
Chloride: 102 mmol/L (ref 98–111)
Creatinine, Ser: 4.75 mg/dL — ABNORMAL HIGH (ref 0.61–1.24)
GFR, Estimated: 13 mL/min — ABNORMAL LOW (ref >60)
Glucose, Bld: 200 mg/dL — ABNORMAL HIGH (ref 70–99)
Phosphorus: 4.2 mg/dL (ref 2.5–4.6)
Potassium: 4.1 mmol/L (ref 3.5–5.1)
Sodium: 139 mmol/L (ref 135–145)

## 2023-10-19 LAB — CBC
HCT: 33 % — ABNORMAL LOW (ref 39.0–52.0)
Hemoglobin: 10.4 g/dL — ABNORMAL LOW (ref 13.0–17.0)
MCH: 28.7 pg (ref 26.0–34.0)
MCHC: 31.5 g/dL (ref 30.0–36.0)
MCV: 91.2 fL (ref 80.0–100.0)
Platelets: 123 10*3/uL — ABNORMAL LOW (ref 150–400)
RBC: 3.62 MIL/uL — ABNORMAL LOW (ref 4.22–5.81)
RDW: 15 % (ref 11.5–15.5)
WBC: 6.8 10*3/uL (ref 4.0–10.5)
nRBC: 0 % (ref 0.0–0.2)

## 2023-10-19 MED ORDER — HEPARIN SODIUM (PORCINE) 1000 UNIT/ML IJ SOLN
INTRAMUSCULAR | Status: AC
  Filled 2023-10-19: qty 2

## 2023-10-19 MED ORDER — ALTEPLASE 2 MG IJ SOLR: 2.0000 mg | Freq: Once | INTRAMUSCULAR | Status: DC | PRN

## 2023-10-19 MED ORDER — LIDOCAINE-PRILOCAINE 2.5-2.5 % EX CREA: 1.0000 | TOPICAL_CREAM | CUTANEOUS | Status: DC | PRN

## 2023-10-19 MED ORDER — NEPRO/CARBSTEADY PO LIQD: 237.0000 mL | ORAL | Status: DC | PRN

## 2023-10-19 MED ORDER — HEPARIN SODIUM (PORCINE) 1000 UNIT/ML DIALYSIS
1000.0000 [IU] | INTRAMUSCULAR | Status: DC | PRN
Start: 2023-10-19 — End: 2023-10-19

## 2023-10-19 MED ORDER — ANTICOAGULANT SODIUM CITRATE 4% (200MG/5ML) IV SOLN: 5.0000 mL | Status: DC | PRN

## 2023-10-19 MED ORDER — CHLORHEXIDINE GLUCONATE CLOTH 2 % EX PADS: 6.0000 | MEDICATED_PAD | Freq: Every day | CUTANEOUS | Status: DC

## 2023-10-19 MED ORDER — HEPARIN SODIUM (PORCINE) 1000 UNIT/ML DIALYSIS
2000.0000 [IU] | Freq: Once | INTRAMUSCULAR | Status: AC
  Administered 2023-10-19: 2000 [IU] via INTRAVENOUS_CENTRAL

## 2023-10-19 MED ORDER — PENTAFLUOROPROP-TETRAFLUOROETH EX AERO: 1.0000 | INHALATION_SPRAY | CUTANEOUS | Status: DC | PRN

## 2023-10-19 MED ORDER — LIDOCAINE HCL (PF) 1 % IJ SOLN: 5.0000 mL | INTRAMUSCULAR | Status: DC | PRN

## 2023-10-19 NOTE — Discharge Instructions (Signed)
Please follow-up for dialysis as scheduled.

## 2023-10-19 NOTE — Procedures (Signed)
We were asked to see this patient for dialysis. Pt was discharged from his OP HD unit in December 2023 due to behavioral issues. Pt came to ED today requesting hospital dialysis. The plan will be for "ED HD". Patient is not to be admitted. Pt will go to the dialysis unit upstairs when they are ready for the pt. When dialysis is completed pt will be sent back to ED for reassessment.      Last OP HD orders (dec 2023):  3:15h  129kg  RUE AVG   Heparin 2000      Clinical issues: 1) Anemia of esrd:  Pt had darbe in late April and in early May, but Hb continued to drop, so dosing of darbepoeitin was increased up to 100 and then up to 150 mcg q 1-2 wks.  Pt also required two 1 gm IV Fe loading programs during the summer and early fall to get the Hb to respond. In Sept / Oct 2024, Hb has been dropping down back into the 8's. Plan: Cont darbepoetin w/ ^ to 200 mcg every 1-2 wks in hospital (pt does not have outpatient unit to get this medication). Last dose was 11/19 200 micrograms   Darbepoeitin needs to ordered for this patient as "once in dialysis" and not "SQ at 1800" because he needs to be ready to leave at 5 pm to catch his ride home.    I have reviewed the HD regimen and made appropriate changes.  Vinson Moselle MD  [facility] 10/19/2023, 1:59 PM

## 2023-10-19 NOTE — ED Notes (Signed)
Patient left prior to dc instruction

## 2023-10-19 NOTE — ED Provider Notes (Signed)
[place] EMERGENCY DEPARTMENT AT [facility] Provider Note   CSN: [id number] Arrival date & time: 10/19/23  2536     History  Chief Complaint  Patient presents with   Needs dialysis    Jeffrey [name] is a 62 y.o. male.  62 year old male with past medical history of ESRD on dialysis presents today for requiring dialysis.  He does not have a primary dialysis center so he typically gets dialyzed through the emergency department.  Last session 2 days ago.  No complaints today.  The history is provided by the patient. No language interpreter was used.       Home Medications Prior to Admission medications   Medication Sig Start Date End Date Taking? Authorizing Provider  acetaminophen (TYLENOL) 325 MG tablet Take 2 tablets (650 mg total) by mouth every 6 (six) hours. 07/23/23   Lockie Mola, MD  amLODipine (NORVASC) 10 MG tablet Take 10 mg by mouth every morning. 04/17/20   [provider]  ASPIRIN LOW DOSE 81 MG EC tablet Take 81 mg by mouth in the morning. 02/22/21   [provider]  atorvastatin (LIPITOR) 40 MG tablet Take 40 mg by mouth in the morning. 02/09/21   [provider]  calcium acetate (PHOSLO) 667 MG capsule Take 2 capsules (1,334 mg total) by mouth with breakfast, with lunch, and with evening meal. 04/10/21   Calvert Cantor, MD  carvedilol (COREG) 12.5 MG tablet Take 12.5 mg by mouth 2 (two) times daily with a meal. 04/17/20   [provider]  clopidogrel (PLAVIX) 75 MG tablet Take 1 tablet (75 mg total) by mouth daily. 11/02/22   Rhyne, Ames Coupe, PA-C  diazepam (VALIUM) 5 MG tablet Take 5 mg by mouth 2 (two) times daily as needed for muscle spasms. 12/09/21   [provider]  Fluticasone-Umeclidin-Vilant (TRELEGY ELLIPTA IN) Inhale 1 puff into the lungs daily.    [provider]  insulin lispro (HUMALOG KWIKPEN) 100 UNIT/ML KwikPen Inject 0-9 Units into the skin 3 (three) times daily with meals. CBG < 70:  treat low blood sugar CBG 70 - 120: 0 units  CBG 121 - 150: 1 unit  CBG 151 - 200: 2 units  CBG 201 - 250: 3 units  CBG 251 - 300: 5 units  CBG 301 - 350: 7 units  CBG 351 - 400: 9 units  CBG > 400: call MD 09/27/23   Zigmund Daniel., MD  insulin regular human CONCENTRATED (HUMULIN R U-500 KWIKPEN) 500 UNIT/ML KwikPen Inject 55 Units into the skin 2 (two) times daily with a meal. Follow up with your PCP and [facility] for additional adjustments 09/27/23 10/27/23  Zigmund Daniel., MD  iron polysaccharides (NIFEREX) 150 MG capsule Take 1 capsule (150 mg total) by mouth daily. 09/26/23   Meredeth Ide, MD  nortriptyline (PAMELOR) 10 MG capsule Take 20 mg by mouth at bedtime.    [provider]  pantoprazole (PROTONIX) 40 MG tablet Take 40 mg by mouth daily before breakfast. 02/09/21   [provider]  pregabalin (LYRICA) 75 MG capsule Take 1 capsule (75 mg total) by mouth See admin instructions. Daily.  Give after dialysis on dialysis days. Patient taking differently: Take 75 mg by mouth daily. 01/21/23   [name], MD  sertraline (ZOLOFT) 100 MG tablet Take 100 mg by mouth in the morning. 02/22/21   [provider]  tamsulosin (FLOMAX) 0.4 MG CAPS capsule Take 1 capsule (0.4 mg  total) by mouth daily. 11/10/21   [name], MD  torsemide (DEMADEX) 20 MG tablet Take 120 mg by mouth 2 (two) times daily.    [provider]  traZODone (DESYREL) 150 MG tablet Take 150 mg by mouth at bedtime. 12/09/21   [provider]  Vitamin D, Ergocalciferol, (DRISDOL) 1.25 MG (50000 UNIT) CAPS capsule Take 1 capsule (50,000 Units total) by mouth every 7 (seven) days. 10/02/23   Meredeth Ide, MD      Allergies    Actos [pioglitazone], Dexmedetomidine, Ibuprofen, Tomato, and Wellbutrin [bupropion]    Review of Systems   Review of Systems  Respiratory:  Negative for shortness of breath.   Cardiovascular:  Negative for chest pain and leg swelling.   Gastrointestinal:  Negative for abdominal distention and abdominal pain.  Neurological:  Negative for light-headedness.  All other systems reviewed and are negative.   Physical Exam Updated Vital Signs BP (!) 154/124 (BP Location: Right Arm)   Pulse 90   Temp 98.2 F (36.8 C) (Oral)   Resp 18   Ht 5\' 5"  (1.651 m)   Wt (!) 140.6 kg   SpO2 97%   BMI 51.59 kg/m  Physical Exam Vitals and nursing note reviewed.  Constitutional:      General: He is not in acute distress.    Appearance: Normal appearance. He is not ill-appearing.  HENT:     Head: Normocephalic and atraumatic.     Nose: Nose normal.  Eyes:     Conjunctiva/sclera: Conjunctivae normal.  Cardiovascular:     Rate and Rhythm: Normal rate and regular rhythm.  Pulmonary:     Effort: Pulmonary effort is normal. No respiratory distress.  Musculoskeletal:        General: Normal range of motion.     Cervical back: Normal range of motion.  Skin:    Findings: No rash.  Neurological:     Mental Status: He is alert.     ED Results / Procedures / Treatments   Labs (all labs ordered are listed, but only abnormal results are displayed) Labs Reviewed - No data to display  EKG None  Radiology No results found.  Procedures Procedures    Medications Ordered in ED Medications  Chlorhexidine Gluconate Cloth 2 % PADS 6 each (has no administration in time range)    ED Course/ Medical Decision Making/ A&P                                 Medical Decision Making  62 year old male presents today for requiring dialysis.  History of ESRD.  He does not have a primary dialysis center anymore.  Stable labs 2 days ago.  Notified nephrologist will evaluate patient for dialysis.  Without signs or symptoms of volume overload.  He will require reevaluation after dialysis for discharge.  Final Clinical Impression(s) / ED Diagnoses Final diagnoses:  ESRD (end stage renal disease) [facility])    Rx / DC Orders ED Discharge  Orders     None         Marita Kansas, PA-C 10/19/23 0815    Franne Forts, DO 10/28/23 1524

## 2023-10-19 NOTE — ED Notes (Signed)
Returned from dialysis with no distress .

## 2023-10-19 NOTE — ED Triage Notes (Signed)
Patient here for his dialysis treatment. Patient is in no distress and on his baseline 3L [place]

## 2023-10-19 NOTE — ED Triage Notes (Signed)
Pt presents to ED for dialysis. Tues, Thursday, Saturday

## 2023-10-21 ENCOUNTER — Other Ambulatory Visit: Payer: Self-pay

## 2023-10-21 ENCOUNTER — Ambulatory Visit (HOSPITAL_COMMUNITY): Admission: RE | Admit: 2023-10-21 | Payer: 59 | Source: Ambulatory Visit

## 2023-10-21 ENCOUNTER — Emergency Department (HOSPITAL_COMMUNITY): Payer: 59

## 2023-10-21 ENCOUNTER — Emergency Department (HOSPITAL_COMMUNITY)
Admission: EM | Admit: 2023-10-21 | Discharge: 2023-10-21 | Discharge status: Against Medical Advice | Payer: 59 | Attending: Emergency Medicine | Admitting: Emergency Medicine

## 2023-10-21 DIAGNOSIS — Z79899 Other long term (current) drug therapy: Secondary | ICD-10-CM | POA: Insufficient documentation

## 2023-10-21 DIAGNOSIS — N186 End stage renal disease: Secondary | ICD-10-CM | POA: Diagnosis not present

## 2023-10-21 DIAGNOSIS — E1122 Type 2 diabetes mellitus with diabetic chronic kidney disease: Secondary | ICD-10-CM | POA: Insufficient documentation

## 2023-10-21 DIAGNOSIS — Z7982 Long term (current) use of aspirin: Secondary | ICD-10-CM | POA: Diagnosis not present

## 2023-10-21 DIAGNOSIS — I132 Hypertensive heart and chronic kidney disease with heart failure and with stage 5 chronic kidney disease, or end stage renal disease: Secondary | ICD-10-CM | POA: Diagnosis not present

## 2023-10-21 DIAGNOSIS — Z992 Dependence on renal dialysis: Secondary | ICD-10-CM | POA: Diagnosis not present

## 2023-10-21 DIAGNOSIS — M545 Low back pain, unspecified: Secondary | ICD-10-CM | POA: Diagnosis present

## 2023-10-21 DIAGNOSIS — Z794 Long term (current) use of insulin: Secondary | ICD-10-CM | POA: Insufficient documentation

## 2023-10-21 DIAGNOSIS — I509 Heart failure, unspecified: Secondary | ICD-10-CM | POA: Diagnosis not present

## 2023-10-21 DIAGNOSIS — M542 Cervicalgia: Secondary | ICD-10-CM | POA: Diagnosis not present

## 2023-10-21 MED ORDER — CHLORHEXIDINE GLUCONATE CLOTH 2 % EX PADS: 6.0000 | MEDICATED_PAD | Freq: Every day | CUTANEOUS | Status: DC

## 2023-10-21 NOTE — ED Notes (Signed)
Pt transported to hemodialysis with signed consent form and belongings.

## 2023-10-21 NOTE — ED Notes (Signed)
Pt resting in home wheelchair, denies any current needs at this time. NAD noted, ongoing care.
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
## 2023-10-21 NOTE — Progress Notes (Signed)
   10/21/23 1308  Vitals  Temp 98.2 F (36.8 C)  Pulse Rate 88  Resp 13  BP (!) 156/76  SpO2 97 %  Post Treatment  Dialyzer Clearance Clear  Hemodialysis Intake (mL) 0 mL  Liters Processed 78.1  Fluid Removed (mL) 3000 mL  Tolerated HD Treatment Yes  AVG/AVF Arterial Site Held (minutes) 10 minutes  AVG/AVF Venous Site Held (minutes) 10 minutes  During Treatment Monitoring  Duration of HD Treatment -hour(s) 3.25 hour(s)   Received patient in bed to unit.  Alert and oriented.  Informed consent signed and in chart.   TX duration:3.25hrs  Patient tolerated well.  Transported back to the room  Alert, without acute distress.  Hand-off given to patient's nurse.   Access used: LAVG Access issues: none  Total UF removed: 3L Medication(s) given: none    [facility]

## 2023-10-21 NOTE — Procedures (Signed)
Asked to see patient for hospital dialysis. Patient is not being admitted. The plan will be for "ED HD". Pt will go to the dialysis unit upstairs when they are ready for the patient. When dialysis is completed pt will be sent back to ED for reassessment.    Last OP HD orders (dec 2023):  3:15h  129kg  RUE AVG   Heparin 2000       Clinical issues: 1) Anemia of esrd:  Pt had darbe in late April and in early May, but Hb continued to drop, so dosing of darbepoeitin was increased up to 100 and then 150 mcg q 1-2 wks.  Pt required two 1 gm IV Fe loading programs during the summer and early fall to get the Hb to respond up to Hb 8-10. In Sept / Oct 2024, Hb dropped down back into the 8's. We ^'d the darbepoetin to 200 mcg every 1-2 wks. He seems to be responding as last Hb on 11/21 was 10.4.   Plan: Cont darbepoetin w/ ^ to 200 mcg every 1-2 wks in hospital (pt does not have outpatient unit to get this medication).  Last dose - 11/19 darbepoetin 200 micrograms Next dose next week sometime    Darbepoeitin needs to ordered as "once"  or "once in dialysis". The order will default to "SQ at 1800" but that timing is not good because he typically needs to be ready to leave at 5 pm to catch his ride home.   I have reviewed the HD regimen and made appropriate changes.  Vinson Moselle MD  [facility] 10/21/2023, 10:55 AM

## 2023-10-24 ENCOUNTER — Emergency Department (HOSPITAL_COMMUNITY)
Admission: EM | Admit: 2023-10-24 | Discharge: 2023-10-24 | Discharge status: Against Medical Advice | Payer: 59 | Attending: Emergency Medicine | Admitting: Emergency Medicine

## 2023-10-24 ENCOUNTER — Other Ambulatory Visit: Payer: Self-pay

## 2023-10-24 DIAGNOSIS — Z794 Long term (current) use of insulin: Secondary | ICD-10-CM | POA: Diagnosis not present

## 2023-10-24 DIAGNOSIS — I120 Hypertensive chronic kidney disease with stage 5 chronic kidney disease or end stage renal disease: Secondary | ICD-10-CM | POA: Insufficient documentation

## 2023-10-24 DIAGNOSIS — N186 End stage renal disease: Secondary | ICD-10-CM | POA: Diagnosis not present

## 2023-10-24 DIAGNOSIS — J449 Chronic obstructive pulmonary disease, unspecified: Secondary | ICD-10-CM | POA: Diagnosis not present

## 2023-10-24 DIAGNOSIS — I2510 Atherosclerotic heart disease of native coronary artery without angina pectoris: Secondary | ICD-10-CM | POA: Insufficient documentation

## 2023-10-24 DIAGNOSIS — Z992 Dependence on renal dialysis: Secondary | ICD-10-CM | POA: Diagnosis not present

## 2023-10-24 DIAGNOSIS — Z7982 Long term (current) use of aspirin: Secondary | ICD-10-CM | POA: Insufficient documentation

## 2023-10-24 DIAGNOSIS — E1122 Type 2 diabetes mellitus with diabetic chronic kidney disease: Secondary | ICD-10-CM | POA: Insufficient documentation

## 2023-10-24 DIAGNOSIS — Z7902 Long term (current) use of antithrombotics/antiplatelets: Secondary | ICD-10-CM | POA: Insufficient documentation

## 2023-10-24 DIAGNOSIS — Z7951 Long term (current) use of inhaled steroids: Secondary | ICD-10-CM | POA: Diagnosis not present

## 2023-10-24 DIAGNOSIS — Z79899 Other long term (current) drug therapy: Secondary | ICD-10-CM | POA: Diagnosis not present

## 2023-10-24 LAB — RENAL FUNCTION PANEL
Albumin: 3.6 g/dL (ref 3.5–5.0)
Anion gap: 13 (ref 5–15)
BUN: 70 mg/dL — ABNORMAL HIGH (ref 8–23)
CO2: 27 mmol/L (ref 22–32)
Calcium: 9.3 mg/dL (ref 8.9–10.3)
Chloride: 96 mmol/L — ABNORMAL LOW (ref 98–111)
Creatinine, Ser: 4.8 mg/dL — ABNORMAL HIGH (ref 0.61–1.24)
GFR, Estimated: 13 mL/min — ABNORMAL LOW (ref >60)
Glucose, Bld: 105 mg/dL — ABNORMAL HIGH (ref 70–99)
Phosphorus: 4.1 mg/dL (ref 2.5–4.6)
Potassium: 3.9 mmol/L (ref 3.5–5.1)
Sodium: 136 mmol/L (ref 135–145)

## 2023-10-24 LAB — CBC
HCT: 32.2 % — ABNORMAL LOW (ref 39.0–52.0)
Hemoglobin: 10.1 g/dL — ABNORMAL LOW (ref 13.0–17.0)
MCH: 27.7 pg (ref 26.0–34.0)
MCHC: 31.4 g/dL (ref 30.0–36.0)
MCV: 88.5 fL (ref 80.0–100.0)
Platelets: 119 10*3/uL — ABNORMAL LOW (ref 150–400)
RBC: 3.64 MIL/uL — ABNORMAL LOW (ref 4.22–5.81)
RDW: 14.6 % (ref 11.5–15.5)
WBC: 6.4 10*3/uL (ref 4.0–10.5)
nRBC: 0 % (ref 0.0–0.2)

## 2023-10-24 LAB — IRON AND TIBC
Iron: 42 ug/dL — ABNORMAL LOW (ref 45–182)
Saturation Ratios: 18 % (ref 17.9–39.5)
TIBC: 232 ug/dL — ABNORMAL LOW (ref 250–450)
UIBC: 190 ug/dL

## 2023-10-24 LAB — HEPATITIS B SURFACE ANTIGEN: Hepatitis B Surface Ag: NONREACTIVE

## 2023-10-24 LAB — FERRITIN: Ferritin: 718 ng/mL — ABNORMAL HIGH (ref 24–336)

## 2023-10-24 MED ORDER — CHLORHEXIDINE GLUCONATE CLOTH 2 % EX PADS: 6.0000 | MEDICATED_PAD | Freq: Every day | CUTANEOUS | Status: DC

## 2023-10-24 NOTE — Progress Notes (Signed)
Harjas Care is a 62 Y/O patient with ESRD who come to hospital for dialysis. He is not admitted, returns to ED post HD. He was not informed of holiday dialysis schedule. Usually attends HD T,TH,S. He has extra volume on board with RLE edema, edema hips, lower abdomen. Will increase UFG to 3.5 liters today. His next dialysis won't be until Saturday which is too long for him to wait between treatments. He has been instructed to return to Hospital for 3 hour treatment to help him safely navigate missing HD on Thursday.  Please call me with questions or concerns.  Alonna Buckler [facility] [phone]

## 2023-10-24 NOTE — Progress Notes (Signed)
   10/24/23 1256  Vitals  Temp 97.7 F (36.5 C)  Pulse Rate 89  Resp 15  BP (!) 142/74  SpO2 100 %  O2 Device Nasal Cannula  Oxygen Therapy  O2 Flow Rate (L/min) 3 L/min  Post Treatment  Dialyzer Clearance Clear  Hemodialysis Intake (mL) 0 mL  Liters Processed 78.4  Fluid Removed (mL) 3100 mL  Tolerated HD Treatment Yes  AVG/AVF Arterial Site Held (minutes) 8 minutes  AVG/AVF Venous Site Held (minutes) 8 minutes  During Treatment Monitoring  Duration of HD Treatment -hour(s) 3.27 hour(s)   Received patient in bed to unit.  Alert and oriented.  Informed consent signed and in chart.   TX duration:3.25hrs terminated 14 mins early due to cramping  Patient tolerated well.  Transported back to the room  Alert, without acute distress.  Hand-off given to patient's nurse.   Access used: LAVG Access issues: none  Total UF removed: 3.1L Medication(s) given: none   [name]

## 2023-10-24 NOTE — ED Notes (Signed)
Patient transported to dialysis

## 2023-10-24 NOTE — ED Provider Notes (Signed)
[place] EMERGENCY DEPARTMENT AT [facility] Provider Note   CSN: [id number] Arrival date & time: 10/24/23  0757     History  Chief Complaint  Patient presents with   Vascular Access Problem    Jeffrey [name] is a 62 y.o. male.  Pt is a 62 yo male with pmhx significant for ESRD on HD, DM2, HTN, COPD on 3L [place] chronically, CAD, OSA, and HLD.  Pt was fired from his outpatient dialysis center and has to come to the ED for his dialysis sessions since December 2023.  His last dialysis session was 11/23.  He feels well today.       Home Medications Prior to Admission medications   Medication Sig Start Date End Date Taking? Authorizing Provider  acetaminophen (TYLENOL) 325 MG tablet Take 2 tablets (650 mg total) by mouth every 6 (six) hours. 07/23/23   Lockie Mola, MD  amLODipine (NORVASC) 10 MG tablet Take 10 mg by mouth every morning. 04/17/20   [provider]  ASPIRIN LOW DOSE 81 MG EC tablet Take 81 mg by mouth in the morning. 02/22/21   [provider]  atorvastatin (LIPITOR) 40 MG tablet Take 40 mg by mouth in the morning. 02/09/21   [provider]  calcium acetate (PHOSLO) 667 MG capsule Take 2 capsules (1,334 mg total) by mouth with breakfast, with lunch, and with evening meal. 04/10/21   Calvert Cantor, MD  carvedilol (COREG) 12.5 MG tablet Take 12.5 mg by mouth 2 (two) times daily with a meal. 04/17/20   [provider]  clopidogrel (PLAVIX) 75 MG tablet Take 1 tablet (75 mg total) by mouth daily. 11/02/22   Rhyne, Ames Coupe, PA-C  diazepam (VALIUM) 5 MG tablet Take 5 mg by mouth 2 (two) times daily as needed for muscle spasms. 12/09/21   [provider]  Fluticasone-Umeclidin-Vilant (TRELEGY ELLIPTA IN) Inhale 1 puff into the lungs daily.    [provider]  insulin lispro (HUMALOG KWIKPEN) 100 UNIT/ML KwikPen Inject 0-9 Units into the skin 3 (three) times daily with meals. CBG < 70: treat low blood sugar CBG 70 -  120: 0 units  CBG 121 - 150: 1 unit  CBG 151 - 200: 2 units  CBG 201 - 250: 3 units  CBG 251 - 300: 5 units  CBG 301 - 350: 7 units  CBG 351 - 400: 9 units  CBG > 400: call MD 09/27/23   Zigmund Daniel., MD  insulin regular human CONCENTRATED (HUMULIN R U-500 KWIKPEN) 500 UNIT/ML KwikPen Inject 55 Units into the skin 2 (two) times daily with a meal. Follow up with your PCP and [facility] for additional adjustments 09/27/23 10/27/23  Zigmund Daniel., MD  iron polysaccharides (NIFEREX) 150 MG capsule Take 1 capsule (150 mg total) by mouth daily. 09/26/23   Meredeth Ide, MD  nortriptyline (PAMELOR) 10 MG capsule Take 20 mg by mouth at bedtime.    [provider]  pantoprazole (PROTONIX) 40 MG tablet Take 40 mg by mouth daily before breakfast. 02/09/21   [provider]  pregabalin (LYRICA) 75 MG capsule Take 1 capsule (75 mg total) by mouth See admin instructions. Daily.  Give after dialysis on dialysis days. Patient taking differently: Take 75 mg by mouth daily. 01/21/23   [name], MD  sertraline (ZOLOFT) 100 MG tablet Take 100 mg by mouth in the morning. 02/22/21   [provider]  tamsulosin (FLOMAX) 0.4 MG CAPS capsule Take  1 capsule (0.4 mg total) by mouth daily. 11/10/21   [name], MD  torsemide (DEMADEX) 20 MG tablet Take 120 mg by mouth 2 (two) times daily.    [provider]  traZODone (DESYREL) 150 MG tablet Take 150 mg by mouth at bedtime. 12/09/21   [provider]  Vitamin D, Ergocalciferol, (DRISDOL) 1.25 MG (50000 UNIT) CAPS capsule Take 1 capsule (50,000 Units total) by mouth every 7 (seven) days. 10/02/23   Meredeth Ide, MD      Allergies    Actos [pioglitazone], Dexmedetomidine, Ibuprofen, Tomato, and Wellbutrin [bupropion]    Review of Systems   Review of Systems  All other systems reviewed and are negative.   Physical Exam Updated Vital Signs BP (!) 152/63   Pulse 86   Temp 98.6 F (37 C) (Oral)    Resp 16   SpO2 100%  Physical Exam Vitals and nursing note reviewed.  Constitutional:      Appearance: Normal appearance. He is obese.  HENT:     Head: Normocephalic and atraumatic.     Right Ear: External ear normal.     Left Ear: External ear normal.     Nose: Nose normal.     Mouth/Throat:     Mouth: Mucous membranes are moist.     Pharynx: Oropharynx is clear.  Eyes:     Extraocular Movements: Extraocular movements intact.     Conjunctiva/sclera: Conjunctivae normal.     Pupils: Pupils are equal, round, and reactive to light.  Cardiovascular:     Rate and Rhythm: Normal rate and regular rhythm.     Pulses: Normal pulses.     Heart sounds: Normal heart sounds.  Pulmonary:     Effort: Pulmonary effort is normal.     Breath sounds: Normal breath sounds.  Abdominal:     General: Abdomen is flat. Bowel sounds are normal.     Palpations: Abdomen is soft.  Musculoskeletal:     Cervical back: Normal range of motion and neck supple.     Comments: B AKA  Skin:    General: Skin is warm.     Capillary Refill: Capillary refill takes less than 2 seconds.  Neurological:     General: No focal deficit present.     Mental Status: He is alert and oriented to person, place, and time.  Psychiatric:        Mood and Affect: Mood normal.        Behavior: Behavior normal.     ED Results / Procedures / Treatments   Labs (all labs ordered are listed, but only abnormal results are displayed) Labs Reviewed  HEPATITIS B SURFACE ANTIGEN  HEPATITIS B SURFACE ANTIBODY, QUANTITATIVE  RENAL FUNCTION PANEL  CBC  PTH, INTACT AND CALCIUM  FERRITIN  IRON AND TIBC    EKG None  Radiology No results found.  Procedures Procedures    Medications Ordered in ED Medications  Chlorhexidine Gluconate Cloth 2 % PADS 6 each (has no administration in time range)    ED Course/ Medical Decision Making/ A&P                                 Medical Decision Making  Pt d/w Nephrology (Dr.  Thedore Mins) who will take him to dialysis.  I anticipate he will be d/c after dialysis.         Final Clinical Impression(s) / ED Diagnoses Final diagnoses:  ESRD (end stage renal disease) on dialysis [facility])    Rx / DC Orders ED Discharge Orders     None         Jacalyn Lefevre, MD 10/24/23 [phone]

## 2023-10-24 NOTE — ED Triage Notes (Signed)
Pt. Here for dialysis . Last treatment was Saturday full treatment.

## 2023-10-24 NOTE — Progress Notes (Signed)
Received notification from [facility] admissions on Thursday, 11/21 that pt is an area denial for [facility] clinics (includes [place] and [place]). Update provided to [facility] staff on 11/21. Pt's insurance is not accepted by [facility] clinics. Pt currently does not have any options for out-pt HD.   Olivia Canter Renal Navigator [phone]

## 2023-10-25 LAB — PTH, INTACT AND CALCIUM
Calcium, Total (PTH): 8.9 mg/dL (ref 8.6–10.2)
PTH: 245 pg/mL — ABNORMAL HIGH (ref 15–65)

## 2023-10-25 LAB — HEPATITIS B SURFACE ANTIBODY, QUANTITATIVE: Hep B S AB Quant (Post): 35.3 m[IU]/mL

## 2023-10-31 ENCOUNTER — Emergency Department (HOSPITAL_COMMUNITY)
Admission: EM | Admit: 2023-10-31 | Discharge: 2023-10-31 | Discharge status: Against Medical Advice | Payer: 59 | Attending: Emergency Medicine | Admitting: Emergency Medicine

## 2023-10-31 ENCOUNTER — Other Ambulatory Visit: Payer: Self-pay

## 2023-10-31 DIAGNOSIS — E1122 Type 2 diabetes mellitus with diabetic chronic kidney disease: Secondary | ICD-10-CM | POA: Insufficient documentation

## 2023-10-31 DIAGNOSIS — Z7901 Long term (current) use of anticoagulants: Secondary | ICD-10-CM | POA: Insufficient documentation

## 2023-10-31 DIAGNOSIS — Z992 Dependence on renal dialysis: Secondary | ICD-10-CM | POA: Insufficient documentation

## 2023-10-31 DIAGNOSIS — D631 Anemia in chronic kidney disease: Secondary | ICD-10-CM | POA: Diagnosis not present

## 2023-10-31 DIAGNOSIS — Z794 Long term (current) use of insulin: Secondary | ICD-10-CM | POA: Diagnosis not present

## 2023-10-31 DIAGNOSIS — Z7982 Long term (current) use of aspirin: Secondary | ICD-10-CM | POA: Insufficient documentation

## 2023-10-31 DIAGNOSIS — N186 End stage renal disease: Secondary | ICD-10-CM | POA: Diagnosis present

## 2023-10-31 LAB — CBC
HCT: 31.1 % — ABNORMAL LOW (ref 39.0–52.0)
Hemoglobin: 10 g/dL — ABNORMAL LOW (ref 13.0–17.0)
MCH: 28.6 pg (ref 26.0–34.0)
MCHC: 32.2 g/dL (ref 30.0–36.0)
MCV: 88.9 fL (ref 80.0–100.0)
Platelets: 116 10*3/uL — ABNORMAL LOW (ref 150–400)
RBC: 3.5 MIL/uL — ABNORMAL LOW (ref 4.22–5.81)
RDW: 14.6 % (ref 11.5–15.5)
WBC: 6.3 10*3/uL (ref 4.0–10.5)
nRBC: 0 % (ref 0.0–0.2)

## 2023-10-31 LAB — RENAL FUNCTION PANEL
Albumin: 3.4 g/dL — ABNORMAL LOW (ref 3.5–5.0)
Anion gap: 14 (ref 5–15)
BUN: 94 mg/dL — ABNORMAL HIGH (ref 8–23)
CO2: 25 mmol/L (ref 22–32)
Calcium: 8.7 mg/dL — ABNORMAL LOW (ref 8.9–10.3)
Chloride: 96 mmol/L — ABNORMAL LOW (ref 98–111)
Creatinine, Ser: 5.11 mg/dL — ABNORMAL HIGH (ref 0.61–1.24)
GFR, Estimated: 12 mL/min — ABNORMAL LOW (ref >60)
Glucose, Bld: 301 mg/dL — ABNORMAL HIGH (ref 70–99)
Phosphorus: 4.7 mg/dL — ABNORMAL HIGH (ref 2.5–4.6)
Potassium: 4.2 mmol/L (ref 3.5–5.1)
Sodium: 135 mmol/L (ref 135–145)

## 2023-10-31 MED ORDER — DARBEPOETIN ALFA 200 MCG/0.4ML IJ SOSY
200.0000 ug | PREFILLED_SYRINGE | Freq: Once | INTRAMUSCULAR | Status: AC
Start: 2023-10-31 — End: 2023-10-31
  Administered 2023-10-31: 200 ug via SUBCUTANEOUS
  Filled 2023-10-31: qty 0.4

## 2023-10-31 MED ORDER — CHLORHEXIDINE GLUCONATE CLOTH 2 % EX PADS
6.0000 | MEDICATED_PAD | Freq: Every day | CUTANEOUS | Status: DC
Start: 2023-10-31 — End: 2023-10-31

## 2023-10-31 NOTE — Procedures (Signed)
Patient was seen on dialysis and the procedure was supervised.  BFR 400  Via AVG BP is  140/67.   Patient appears to be tolerating treatment well  Jeffrey [name] 10/31/2023

## 2023-10-31 NOTE — Progress Notes (Signed)
Jeffrey [name] 01/29/1961 [id number]   Patient in the ED for routine hemodialysis.  Getting regular dialysis in the ED due to being discharged from local outpatient unit due to behavior issues.  Orders written for HD today.  Per previous notes due for ESA - will order.  Hep B panel completed last week.    Virgina Norfolk, PA-C [employer]

## 2023-10-31 NOTE — Progress Notes (Signed)
   10/31/23 1400  Vitals  Temp 98 F (36.7 C)  Pulse Rate 87  Resp 19  BP (!) 148/101  SpO2 100 %  O2 Device Nasal Cannula  Weight  (stretcher)  Oxygen Therapy  Patient Activity (if Appropriate) In bed  Pulse Oximetry Type Continuous  Oximetry Probe Site Changed No  Post Treatment  Dialyzer Clearance Lightly streaked  Hemodialysis Intake (mL) 0 mL  Liters Processed 78  Fluid Removed (mL) 2000 mL  Tolerated HD Treatment Yes  AVG/AVF Arterial Site Held (minutes) 10 minutes  AVG/AVF Venous Site Held (minutes) 10 minutes  During Treatment Monitoring  Duration of HD Treatment -hour(s) 3.25 hour(s)   Received patient in bed to unit.  Alert and oriented.  Informed consent signed and in chart.   TX duration:3.25hrs  Patient tolerated well.  Transported back to the room  Alert, without acute distress.  Hand-off given to patient's nurse.   Access used: LAVG Access issues: none  Total UF removed: 2L Medication(s) given: none    [name]

## 2023-10-31 NOTE — ED Triage Notes (Signed)
Pt. Here for dialysis . Completed treatment last Wednesday. Continues to have left shoulder, wrist, and neck pain.

## 2023-10-31 NOTE — ED Notes (Signed)
Pt transported to Hemodialysis.  

## 2023-10-31 NOTE — ED Provider Notes (Signed)
[place] EMERGENCY DEPARTMENT AT [facility] Provider Note   CSN: [id number] Arrival date & time: 10/31/23  1660     History  Chief Complaint  Patient presents with   Vascular Access Problem   Neck Pain   Wrist Pain    Jeffrey [name] is a 62 y.o. male.  62 year old male with prior medical history as detailed below presents for evaluation.  Patient presenting for his routine dialysis.  Last completed dialysis session was last Wednesday.  He is without acute complaint.  The history is provided by the patient and medical records.       Home Medications Prior to Admission medications   Medication Sig Start Date End Date Taking? Authorizing Provider  acetaminophen (TYLENOL) 325 MG tablet Take 2 tablets (650 mg total) by mouth every 6 (six) hours. 07/23/23   Lockie Mola, MD  amLODipine (NORVASC) 10 MG tablet Take 10 mg by mouth every morning. 04/17/20   [provider]  ASPIRIN LOW DOSE 81 MG EC tablet Take 81 mg by mouth in the morning. 02/22/21   [provider]  atorvastatin (LIPITOR) 40 MG tablet Take 40 mg by mouth in the morning. 02/09/21   [provider]  calcium acetate (PHOSLO) 667 MG capsule Take 2 capsules (1,334 mg total) by mouth with breakfast, with lunch, and with evening meal. 04/10/21   Calvert Cantor, MD  carvedilol (COREG) 12.5 MG tablet Take 12.5 mg by mouth 2 (two) times daily with a meal. 04/17/20   [provider]  clopidogrel (PLAVIX) 75 MG tablet Take 1 tablet (75 mg total) by mouth daily. 11/02/22   Rhyne, Ames Coupe, PA-C  diazepam (VALIUM) 5 MG tablet Take 5 mg by mouth 2 (two) times daily as needed for muscle spasms. 12/09/21   [provider]  Fluticasone-Umeclidin-Vilant (TRELEGY ELLIPTA IN) Inhale 1 puff into the lungs daily.    [provider]  insulin lispro (HUMALOG KWIKPEN) 100 UNIT/ML KwikPen Inject 0-9 Units into the skin 3 (three) times daily with meals. CBG < 70: treat low blood  sugar CBG 70 - 120: 0 units  CBG 121 - 150: 1 unit  CBG 151 - 200: 2 units  CBG 201 - 250: 3 units  CBG 251 - 300: 5 units  CBG 301 - 350: 7 units  CBG 351 - 400: 9 units  CBG > 400: call MD 09/27/23   Zigmund Daniel., MD  insulin regular human CONCENTRATED (HUMULIN R U-500 KWIKPEN) 500 UNIT/ML KwikPen Inject 55 Units into the skin 2 (two) times daily with a meal. Follow up with your PCP and [facility] for additional adjustments 09/27/23 10/27/23  Zigmund Daniel., MD  iron polysaccharides (NIFEREX) 150 MG capsule Take 1 capsule (150 mg total) by mouth daily. 09/26/23   Meredeth Ide, MD  nortriptyline (PAMELOR) 10 MG capsule Take 20 mg by mouth at bedtime.    [provider]  pantoprazole (PROTONIX) 40 MG tablet Take 40 mg by mouth daily before breakfast. 02/09/21   [provider]  pregabalin (LYRICA) 75 MG capsule Take 1 capsule (75 mg total) by mouth See admin instructions. Daily.  Give after dialysis on dialysis days. Patient taking differently: Take 75 mg by mouth daily. 01/21/23   [name], MD  sertraline (ZOLOFT) 100 MG tablet Take 100 mg by mouth in the morning. 02/22/21   [provider]  tamsulosin (FLOMAX) 0.4 MG CAPS capsule Take 1 capsule (0.4 mg total) by mouth  daily. 11/10/21   [name], MD  torsemide (DEMADEX) 20 MG tablet Take 120 mg by mouth 2 (two) times daily.    [provider]  traZODone (DESYREL) 150 MG tablet Take 150 mg by mouth at bedtime. 12/09/21   [provider]  Vitamin D, Ergocalciferol, (DRISDOL) 1.25 MG (50000 UNIT) CAPS capsule Take 1 capsule (50,000 Units total) by mouth every 7 (seven) days. 10/02/23   Meredeth Ide, MD      Allergies    Actos [pioglitazone], Dexmedetomidine, Ibuprofen, Tomato, and Wellbutrin [bupropion]    Review of Systems   Review of Systems  All other systems reviewed and are negative.   Physical Exam Updated Vital Signs BP 121/75 (BP Location: Right Arm)   Pulse  93   Temp 97.8 F (36.6 C)   Resp 18   SpO2 100%  Physical Exam Vitals and nursing note reviewed.  Constitutional:      General: He is not in acute distress.    Appearance: Normal appearance. He is well-developed.  HENT:     Head: Normocephalic and atraumatic.  Eyes:     Conjunctiva/sclera: Conjunctivae normal.     Pupils: Pupils are equal, round, and reactive to light.  Cardiovascular:     Rate and Rhythm: Normal rate and regular rhythm.     Heart sounds: Normal heart sounds.  Pulmonary:     Effort: Pulmonary effort is normal. No respiratory distress.     Breath sounds: Normal breath sounds.  Abdominal:     General: There is no distension.     Palpations: Abdomen is soft.     Tenderness: There is no abdominal tenderness.  Musculoskeletal:        General: No deformity. Normal range of motion.     Cervical back: Normal range of motion and neck supple.  Skin:    General: Skin is warm and dry.  Neurological:     General: No focal deficit present.     Mental Status: He is alert and oriented to person, place, and time.     ED Results / Procedures / Treatments   Labs (all labs ordered are listed, but only abnormal results are displayed) Labs Reviewed  RENAL FUNCTION PANEL  CBC    EKG None  Radiology No results found.  Procedures Procedures    Medications Ordered in ED Medications  Chlorhexidine Gluconate Cloth 2 % PADS 6 each (0 each Topical Hold 10/31/23 0837)  Darbepoetin Alfa (ARANESP) injection 200 mcg (has no administration in time range)    ED Course/ Medical Decision Making/ A&P                                 Medical Decision Making   Medical Screen Complete  This patient presented to the ED with complaint of need for routine dialysis session.  This complaint involves an extensive number of treatment options. The initial differential diagnosis includes, but is not limited to, ESRD  This presentation is: Chronic  Patient is presenting for  routine dialysis session.  He is without acute complaint.  Nephrology made aware of case.    Patient should be appropriate for discharge after completion of dialysis session.  Additional history obtained:  External records from outside sources obtained and reviewed including prior ED visits and prior Inpatient records.    Problem List / ED Course:  ESRD   Reevaluation:  After the interventions noted above, I reevaluated the  patient and found that they have: stayed the same     Disposition:  After consideration of the diagnostic results and the patients response to treatment, I feel that the patent would benefit from dialysis.          Final Clinical Impression(s) / ED Diagnoses Final diagnoses:  ESRD (end stage renal disease) (HCC)    Rx / DC Orders ED Discharge Orders     None         Wynetta Fines, MD 10/31/23 [phone]

## 2023-11-02 ENCOUNTER — Emergency Department (HOSPITAL_COMMUNITY)
Admission: EM | Admit: 2023-11-02 | Discharge: 2023-11-02 | Disposition: A | Discharge status: Home / Self Care | Payer: 59 | Attending: Emergency Medicine | Admitting: Emergency Medicine

## 2023-11-02 ENCOUNTER — Other Ambulatory Visit: Payer: Self-pay

## 2023-11-02 DIAGNOSIS — N186 End stage renal disease: Secondary | ICD-10-CM | POA: Insufficient documentation

## 2023-11-02 DIAGNOSIS — Z794 Long term (current) use of insulin: Secondary | ICD-10-CM | POA: Insufficient documentation

## 2023-11-02 DIAGNOSIS — Z992 Dependence on renal dialysis: Secondary | ICD-10-CM | POA: Diagnosis not present

## 2023-11-02 DIAGNOSIS — Z7982 Long term (current) use of aspirin: Secondary | ICD-10-CM | POA: Insufficient documentation

## 2023-11-02 LAB — RENAL FUNCTION PANEL
Albumin: 3.3 g/dL — ABNORMAL LOW (ref 3.5–5.0)
Anion gap: 12 (ref 5–15)
BUN: 61 mg/dL — ABNORMAL HIGH (ref 8–23)
CO2: 26 mmol/L (ref 22–32)
Calcium: 8.4 mg/dL — ABNORMAL LOW (ref 8.9–10.3)
Chloride: 92 mmol/L — ABNORMAL LOW (ref 98–111)
Creatinine, Ser: 4.27 mg/dL — ABNORMAL HIGH (ref 0.61–1.24)
GFR, Estimated: 15 mL/min — ABNORMAL LOW (ref >60)
Glucose, Bld: 662 mg/dL (ref 70–99)
Phosphorus: 4.3 mg/dL (ref 2.5–4.6)
Potassium: 4 mmol/L (ref 3.5–5.1)
Sodium: 130 mmol/L — ABNORMAL LOW (ref 135–145)

## 2023-11-02 LAB — CBC
HCT: 31.5 % — ABNORMAL LOW (ref 39.0–52.0)
Hemoglobin: 9.9 g/dL — ABNORMAL LOW (ref 13.0–17.0)
MCH: 27.7 pg (ref 26.0–34.0)
MCHC: 31.4 g/dL (ref 30.0–36.0)
MCV: 88 fL (ref 80.0–100.0)
Platelets: 103 10*3/uL — ABNORMAL LOW (ref 150–400)
RBC: 3.58 MIL/uL — ABNORMAL LOW (ref 4.22–5.81)
RDW: 14.1 % (ref 11.5–15.5)
WBC: 6.3 10*3/uL (ref 4.0–10.5)
nRBC: 0 % (ref 0.0–0.2)

## 2023-11-02 MED ORDER — HEPARIN SODIUM (PORCINE) 1000 UNIT/ML IJ SOLN
2000.0000 [IU] | Freq: Once | INTRAMUSCULAR | Status: AC
  Administered 2023-11-02: 2000 [IU]
  Filled 2023-11-02: qty 2

## 2023-11-02 MED ORDER — CHLORHEXIDINE GLUCONATE CLOTH 2 % EX PADS: 6.0000 | MEDICATED_PAD | Freq: Every day | CUTANEOUS | Status: DC

## 2023-11-02 MED ORDER — LIDOCAINE HCL (PF) 1 % IJ SOLN: 5.0000 mL | INTRAMUSCULAR | Status: DC | PRN

## 2023-11-02 NOTE — Progress Notes (Signed)
   11/02/23 1630  Vitals  Temp 98 F (36.7 C)  Pulse Rate 90  Resp 18  BP (!) 145/78  SpO2 99 %  O2 Device Nasal Cannula  Weight  (pt on a stretcher--unable to weigh)  Type of Weight Post-Dialysis  Oxygen Therapy  O2 Flow Rate (L/min) 3 L/min  Patient Activity (if Appropriate) In bed  Pulse Oximetry Type Continuous  Oximetry Probe Site Changed No  Post Treatment  Dialyzer Clearance Lightly streaked  Hemodialysis Intake (mL) 0 mL  Liters Processed 50.1  Fluid Removed (mL) 2.05 mL  Tolerated HD Treatment Yes  Post-Hemodialysis Comments pt signed an AMA form for coming off of his tx early  AVG/AVF Arterial Site Held (minutes) 10 minutes  AVG/AVF Venous Site Held (minutes) 10 minutes  During Treatment Monitoring  Duration of HD Treatment -hour(s) 2.09 hour(s)   Received patient in bed to unit.  Alert and oriented.  Informed consent signed and in chart.   TX duration:2.05--pt signed off AMA ---nephrology is aware  Patient tolerated well.  Transported back to the room  Alert, without acute distress.  Hand-off given to patient's nurse.   Access used: LUAG Access issues: no complications  Total UF removed: 1300 Medication(s) given: none   [facility]

## 2023-11-02 NOTE — ED Provider Notes (Signed)
  Physical Exam  BP (!) 145/78   Pulse 90   Temp 98 F (36.7 C)   Resp 18   SpO2 99%   Physical Exam  Procedures  Procedures  ED Course / MDM    Medical Decision Making Risk Decision regarding hospitalization.   I Anders Simmonds, thus patient emergency department after he finished dialysis.  His dialysis has been completed.  He may be discharged.       Anders Simmonds T, DO 11/02/23 1717

## 2023-11-02 NOTE — ED Triage Notes (Signed)
Pt. Here for dialysis. Completed full treatment on Tuesday. Continues to have neck pain

## 2023-11-02 NOTE — Progress Notes (Signed)
Lab called with a critical lab result on Jeffrey [name]---662 for serum blood glucose---Lindsey Penninger PA made aware--no new orders--

## 2023-11-02 NOTE — Discharge Instructions (Signed)
Go to your scheduled dialysis.  Follow-up with your primary care doctor.

## 2023-11-02 NOTE — ED Provider Notes (Signed)
[place] EMERGENCY DEPARTMENT AT [facility] Provider Note   CSN: [id number] Arrival date & time: 11/02/23  8657     History  No chief complaint on file.   Jeffrey [name] is a 62 y.o. male.  HPI Patient presents 2 days after left having dialysis now with concern for needed dialysis.  He states that he feels generally well, has no new shortness of breath, but he continues to require 2 L via nasal cannula 24/7.  No interval changes, complaints.    Home Medications Prior to Admission medications   Medication Sig Start Date End Date Taking? Authorizing Provider  acetaminophen (TYLENOL) 325 MG tablet Take 2 tablets (650 mg total) by mouth every 6 (six) hours. 07/23/23   Lockie Mola, MD  amLODipine (NORVASC) 10 MG tablet Take 10 mg by mouth every morning. 04/17/20   [provider]  ASPIRIN LOW DOSE 81 MG EC tablet Take 81 mg by mouth in the morning. 02/22/21   [provider]  atorvastatin (LIPITOR) 40 MG tablet Take 40 mg by mouth in the morning. 02/09/21   [provider]  calcium acetate (PHOSLO) 667 MG capsule Take 2 capsules (1,334 mg total) by mouth with breakfast, with lunch, and with evening meal. 04/10/21   Calvert Cantor, MD  carvedilol (COREG) 12.5 MG tablet Take 12.5 mg by mouth 2 (two) times daily with a meal. 04/17/20   [provider]  clopidogrel (PLAVIX) 75 MG tablet Take 1 tablet (75 mg total) by mouth daily. 11/02/22   Rhyne, Ames Coupe, PA-C  diazepam (VALIUM) 5 MG tablet Take 5 mg by mouth 2 (two) times daily as needed for muscle spasms. 12/09/21   [provider]  Fluticasone-Umeclidin-Vilant (TRELEGY ELLIPTA IN) Inhale 1 puff into the lungs daily.    [provider]  insulin lispro (HUMALOG KWIKPEN) 100 UNIT/ML KwikPen Inject 0-9 Units into the skin 3 (three) times daily with meals. CBG < 70: treat low blood sugar CBG 70 - 120: 0 units  CBG 121 - 150: 1 unit  CBG 151 - 200: 2 units  CBG 201 - 250: 3 units   CBG 251 - 300: 5 units  CBG 301 - 350: 7 units  CBG 351 - 400: 9 units  CBG > 400: call MD 09/27/23   Zigmund Daniel., MD  insulin regular human CONCENTRATED (HUMULIN R U-500 KWIKPEN) 500 UNIT/ML KwikPen Inject 55 Units into the skin 2 (two) times daily with a meal. Follow up with your PCP and [facility] for additional adjustments 09/27/23 10/27/23  Zigmund Daniel., MD  iron polysaccharides (NIFEREX) 150 MG capsule Take 1 capsule (150 mg total) by mouth daily. 09/26/23   Meredeth Ide, MD  nortriptyline (PAMELOR) 10 MG capsule Take 20 mg by mouth at bedtime.    [provider]  pantoprazole (PROTONIX) 40 MG tablet Take 40 mg by mouth daily before breakfast. 02/09/21   [provider]  pregabalin (LYRICA) 75 MG capsule Take 1 capsule (75 mg total) by mouth See admin instructions. Daily.  Give after dialysis on dialysis days. Patient taking differently: Take 75 mg by mouth daily. 01/21/23   [name], MD  sertraline (ZOLOFT) 100 MG tablet Take 100 mg by mouth in the morning. 02/22/21   [provider]  tamsulosin (FLOMAX) 0.4 MG CAPS capsule Take 1 capsule (0.4 mg total) by mouth daily. 11/10/21   [name], MD  torsemide (DEMADEX) 20 MG tablet Take 120 mg by  mouth 2 (two) times daily.    [provider]  traZODone (DESYREL) 150 MG tablet Take 150 mg by mouth at bedtime. 12/09/21   [provider]  Vitamin D, Ergocalciferol, (DRISDOL) 1.25 MG (50000 UNIT) CAPS capsule Take 1 capsule (50,000 Units total) by mouth every 7 (seven) days. 10/02/23   Meredeth Ide, MD      Allergies    Actos [pioglitazone], Dexmedetomidine, Ibuprofen, Tomato, and Wellbutrin [bupropion]    Review of Systems   Review of Systems  Physical Exam Updated Vital Signs BP (!) 163/79 (BP Location: Right Arm)   Pulse 87   Temp 98.2 F (36.8 C)   Resp 18   SpO2 99%  Physical Exam Vitals and nursing note reviewed.  Constitutional:      General: He is not  in acute distress.    Appearance: He is well-developed.  HENT:     Head: Normocephalic and atraumatic.  Eyes:     Conjunctiva/sclera: Conjunctivae normal.  Pulmonary:     Effort: Pulmonary effort is normal. No respiratory distress.     Breath sounds: No stridor.  Abdominal:     General: There is no distension.  Musculoskeletal:     Comments: Prior amputation noted  Skin:    General: Skin is warm and dry.  Neurological:     Mental Status: He is alert and oriented to person, place, and time.     ED Results / Procedures / Treatments   Labs (all labs ordered are listed, but only abnormal results are displayed) Labs Reviewed - No data to display  EKG None  Radiology No results found.  Procedures Procedures    Medications Ordered in ED Medications - No data to display  ED Course/ Medical Decision Making/ A&P                                 Medical Decision Making Adult with multiple medical issues including stage renal disease presents with need for dialysis.  Absence of complaints, hemodynamic instability, or new physical exam changes noted.  Patient medically appropriate for dialysis.  Nephrology contacted.  Amount and/or Complexity of Data Reviewed External Data Reviewed: notes.  Risk Decision regarding hospitalization. Diagnosis or treatment significantly limited by social determinants of health.  Final Clinical Impression(s) / ED Diagnoses Final diagnoses:  ESRD (end stage renal disease) (HCC)     Gerhard Munch, MD 11/02/23 [phone]

## 2023-11-04 ENCOUNTER — Encounter (HOSPITAL_COMMUNITY): Payer: Self-pay | Admitting: Emergency Medicine

## 2023-11-04 ENCOUNTER — Emergency Department (HOSPITAL_COMMUNITY)
Admission: EM | Admit: 2023-11-04 | Discharge: 2023-11-04 | Disposition: A | Discharge status: Home / Self Care | Payer: 59 | Attending: Emergency Medicine | Admitting: Emergency Medicine

## 2023-11-04 ENCOUNTER — Other Ambulatory Visit: Payer: Self-pay

## 2023-11-04 DIAGNOSIS — Z992 Dependence on renal dialysis: Secondary | ICD-10-CM | POA: Insufficient documentation

## 2023-11-04 DIAGNOSIS — Z6841 Body Mass Index (BMI) 40.0 and over, adult: Secondary | ICD-10-CM | POA: Insufficient documentation

## 2023-11-04 DIAGNOSIS — Z7901 Long term (current) use of anticoagulants: Secondary | ICD-10-CM | POA: Diagnosis not present

## 2023-11-04 DIAGNOSIS — J449 Chronic obstructive pulmonary disease, unspecified: Secondary | ICD-10-CM | POA: Diagnosis not present

## 2023-11-04 DIAGNOSIS — N186 End stage renal disease: Secondary | ICD-10-CM | POA: Insufficient documentation

## 2023-11-04 DIAGNOSIS — Z794 Long term (current) use of insulin: Secondary | ICD-10-CM | POA: Insufficient documentation

## 2023-11-04 DIAGNOSIS — E1122 Type 2 diabetes mellitus with diabetic chronic kidney disease: Secondary | ICD-10-CM | POA: Diagnosis not present

## 2023-11-04 DIAGNOSIS — I120 Hypertensive chronic kidney disease with stage 5 chronic kidney disease or end stage renal disease: Secondary | ICD-10-CM | POA: Diagnosis not present

## 2023-11-04 DIAGNOSIS — E669 Obesity, unspecified: Secondary | ICD-10-CM | POA: Insufficient documentation

## 2023-11-04 DIAGNOSIS — I2510 Atherosclerotic heart disease of native coronary artery without angina pectoris: Secondary | ICD-10-CM | POA: Insufficient documentation

## 2023-11-04 DIAGNOSIS — Z79899 Other long term (current) drug therapy: Secondary | ICD-10-CM | POA: Diagnosis not present

## 2023-11-04 LAB — RENAL FUNCTION PANEL
Albumin: 3.4 g/dL — ABNORMAL LOW (ref 3.5–5.0)
Anion gap: 13 (ref 5–15)
BUN: 69 mg/dL — ABNORMAL HIGH (ref 8–23)
CO2: 25 mmol/L (ref 22–32)
Calcium: 8.5 mg/dL — ABNORMAL LOW (ref 8.9–10.3)
Chloride: 96 mmol/L — ABNORMAL LOW (ref 98–111)
Creatinine, Ser: 5.07 mg/dL — ABNORMAL HIGH (ref 0.61–1.24)
GFR, Estimated: 12 mL/min — ABNORMAL LOW (ref >60)
Glucose, Bld: 265 mg/dL — ABNORMAL HIGH (ref 70–99)
Phosphorus: 4.7 mg/dL — ABNORMAL HIGH (ref 2.5–4.6)
Potassium: 4.3 mmol/L (ref 3.5–5.1)
Sodium: 134 mmol/L — ABNORMAL LOW (ref 135–145)

## 2023-11-04 LAB — CBC
HCT: 31.1 % — ABNORMAL LOW (ref 39.0–52.0)
Hemoglobin: 10 g/dL — ABNORMAL LOW (ref 13.0–17.0)
MCH: 28.5 pg (ref 26.0–34.0)
MCHC: 32.2 g/dL (ref 30.0–36.0)
MCV: 88.6 fL (ref 80.0–100.0)
Platelets: 127 10*3/uL — ABNORMAL LOW (ref 150–400)
RBC: 3.51 MIL/uL — ABNORMAL LOW (ref 4.22–5.81)
RDW: 14.3 % (ref 11.5–15.5)
WBC: 7 10*3/uL (ref 4.0–10.5)
nRBC: 0 % (ref 0.0–0.2)

## 2023-11-04 MED ORDER — NEPRO/CARBSTEADY PO LIQD
237.0000 mL | ORAL | Status: DC | PRN
  Filled 2023-11-04: qty 237

## 2023-11-04 MED ORDER — DIAZEPAM 5 MG PO TABS
5.0000 mg | ORAL_TABLET | Freq: Once | ORAL | Status: AC
  Administered 2023-11-04: 5 mg via ORAL
  Filled 2023-11-04: qty 1

## 2023-11-04 MED ORDER — PENTAFLUOROPROP-TETRAFLUOROETH EX AERO
1.0000 | INHALATION_SPRAY | CUTANEOUS | Status: DC | PRN
  Filled 2023-11-04: qty 116

## 2023-11-04 MED ORDER — LIDOCAINE-PRILOCAINE 2.5-2.5 % EX CREA
1.0000 | TOPICAL_CREAM | CUTANEOUS | Status: DC | PRN
  Filled 2023-11-04: qty 5

## 2023-11-04 MED ORDER — CALCIUM ACETATE (PHOS BINDER) 667 MG PO CAPS: 1334.0000 mg | ORAL_CAPSULE | Freq: Three times a day (TID) | ORAL | 0 refills | Status: AC

## 2023-11-04 MED ORDER — CHLORHEXIDINE GLUCONATE CLOTH 2 % EX PADS
6.0000 | MEDICATED_PAD | Freq: Every day | CUTANEOUS | Status: DC
Start: 2023-11-04 — End: 2023-11-04

## 2023-11-04 MED ORDER — HEPARIN SODIUM (PORCINE) 1000 UNIT/ML DIALYSIS
1000.0000 [IU] | INTRAMUSCULAR | Status: DC | PRN
  Administered 2023-11-04: 1000 [IU] via INTRAVENOUS_CENTRAL
  Filled 2023-11-04: qty 1

## 2023-11-04 MED ORDER — HYDROCODONE-ACETAMINOPHEN 5-325 MG PO TABS
1.0000 | ORAL_TABLET | Freq: Once | ORAL | Status: AC
Start: 2023-11-04 — End: 2023-11-04
  Administered 2023-11-04: 1 via ORAL
  Filled 2023-11-04: qty 1

## 2023-11-04 MED ORDER — HEPARIN SODIUM (PORCINE) 1000 UNIT/ML DIALYSIS: 1000.0000 [IU] | INTRAMUSCULAR | Status: DC | PRN

## 2023-11-04 MED ORDER — LIDOCAINE HCL (PF) 1 % IJ SOLN: 5.0000 mL | INTRAMUSCULAR | Status: DC | PRN

## 2023-11-04 MED ORDER — ANTICOAGULANT SODIUM CITRATE 4% (200MG/5ML) IV SOLN
5.0000 mL | Status: DC | PRN
  Filled 2023-11-04: qty 5

## 2023-11-04 MED ORDER — ALTEPLASE 2 MG IJ SOLR: 2.0000 mg | Freq: Once | INTRAMUSCULAR | Status: DC | PRN

## 2023-11-04 NOTE — ED Provider Notes (Signed)
4:18 PM Patient returned from dialysis and is feeling well.  He would like to go home.  He reports he is supposed to get a prescription for calcium acetate for discharge as he is running low on his home prescription.  Will send in a prescription that he has been on in the past.  He was discharged in good condition.   Clinical Impression: 1. ESRD on hemodialysis [facility])     Disposition: Discharge  Condition: Good  I have discussed the results, Dx and Tx plan with the pt(& family if present). He/she/they expressed understanding and agree(s) with the plan. Discharge instructions discussed at great length. Strict return precautions discussed and pt &/or family have verbalized understanding of the instructions. No further questions at time of discharge.    New Prescriptions   CALCIUM ACETATE (PHOSLO) 667 MG CAPSULE    Take 2 capsules (1,334 mg total) by mouth 3 (three) times daily with meals.    Follow Up: Tressie Stalker, MD [address] [place] [address] [phone]   As needed     Donnamarie Shankles, Canary Brim, MD 11/04/23 [phone]
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
## 2023-11-04 NOTE — Progress Notes (Signed)
Received patient in bed to unit.  Alert and oriented.  Informed consent signed and in chart.   TX duration: 3 hours and 27 minutes.  Patient self terminated, refused to sign AMA paperwork, Dr. Allena Katz informed.  Patient tolerated well.  Transported back to the room  Alert, without acute distress.  Hand-off given to patient's nurse.   Access used: Left upper arm graft Access issues: none  Total UF removed: 2L Medication(s) given: none   11/04/23 1543  Vitals  Temp 97.7 F (36.5 C)  Temp Source Oral  BP (!) 122/101  Pulse Rate 86  ECG Heart Rate 87  Resp 12  Oxygen Therapy  SpO2 97 %  O2 Device Nasal Cannula  O2 Flow Rate (L/min) 3 L/min  During Treatment Monitoring  Duration of HD Treatment -hour(s) 3.46 hour(s) (3 hours and 27 minutes)  HD Safety Checks Performed Yes  Intra-Hemodialysis Comments See progress note (Patient self terminated with 3 minutes left.  Dr. Allena Katz informed.)  Dialysis Fluid Bolus Normal Saline  Bolus Amount (mL) 300 mL  Post Treatment  Dialyzer Clearance Lightly streaked  Liters Processed 83  Fluid Removed (mL) 2000 mL  Tolerated HD Treatment Yes  Fistula / Graft Left Upper arm Arteriovenous vein graft  Placement Date/Time: (c) 04/22/23 (c) 1000   Orientation: Left  Access Location: Upper arm  Access Type: Arteriovenous vein graft  Status Deaccessed     Stacie Glaze LPN [facility]

## 2023-11-04 NOTE — ED Notes (Signed)
RN provided pt coffee

## 2023-11-04 NOTE — ED Notes (Signed)
Pt states he wants HD to do labs
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
## 2023-11-07 NOTE — ED Provider Notes (Signed)
[place] EMERGENCY DEPARTMENT AT [facility] Provider Note  CSN: [id number] Arrival date & time: 09/06/23 5366  Chief Complaint(s) Vascular Access Problem and Wrist Pain  HPI Jeffrey [name] is a 62 y.o. male who presents emergency room and for evaluation of need for routine dialysis.  Patient also complaining of wrist pain which is a chronic issue for the patient.  Currently feeling mildly short of breath but denies chest pain, headache, fever or other systemic symptoms.   Past Medical History Past Medical History:  Diagnosis Date   Altered mental status 05/04/2021   Arthritis    Blood transfusion without reported diagnosis    CHF (congestive heart failure) (HCC)    COPD (chronic obstructive pulmonary disease) (HCC)    Depression    Diabetes mellitus without complication (HCC)    type 2   Dyspnea    ESRD on hemodialysis (HCC)    Tues Thurs Sat   GERD (gastroesophageal reflux disease)    protonix   Heart murmur    as a child, no problems   Hypertension    Myocardial infarction [facility])    PTSD (post-traumatic stress disorder)    Sepsis (HCC)    Sleep apnea    occasional uses CPAP   Uses powered wheelchair    Patient Active Problem List   Diagnosis Date Noted   Hyperosmolar hyperglycemic state (HHS) (HCC) 09/17/2023   Chest pain 09/16/2023   Visual field defect of right eye 07/20/2023   Anemia due to chronic kidney disease, on chronic dialysis (HCC) 07/19/2023   Hyperuricemia 07/19/2023   Left arm pain 07/17/2023   Shortness of breath 06/01/2023   Hyperglycemia due to diabetes mellitus (HCC) 04/06/2023   Accelerated hypertension 04/06/2023   ESRD on dialysis (HCC) 03/03/2023   Acute on chronic respiratory failure with hypoxia (HCC) 11/24/2022   Uremia 11/24/2022   Encounter for removal of sutures 07/15/2022   Stress reaction of bone 07/12/2022   Benign prostatic hyperplasia with weak urinary stream 12/30/2021   Swelling of right upper extremity 11/07/2021    S/P reverse total shoulder arthroplasty, right 10/27/2021   AV graft malfunction (HCC) 09/01/2021   Dialysis AV fistula malfunction, initial encounter (HCC) 08/30/2021   Mood disorder (HCC) 08/30/2021   Disorder of ligament, right ankle 08/03/2021   Pain in right ankle and joints of right foot 08/03/2021   Local infection due to central venous catheter, sequela 07/21/2021   Acquired absence of left leg below knee (HCC) 06/21/2021   S/P AKA (above knee amputation), left (HCC) 05/04/2021   Bacteremia 04/20/2021   Other specified joint disorders, left knee 04/20/2021   Iron deficiency anemia, unspecified 04/13/2021   Anemia of chronic disease    Infection of prosthetic left knee joint (HCC)    MRSA bacteremia 03/30/2021   Left knee pain    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    Fever 03/29/2021   End stage renal disease (HCC) 03/29/2021   COPD (chronic obstructive pulmonary disease) (HCC) 03/29/2021   Type 2 diabetes mellitus with hyperlipidemia (HCC) 03/29/2021   PTSD (post-traumatic stress disorder) 03/29/2021   Dyspnea, unspecified 10/12/2020   Pain in arm, unspecified 10/12/2020   Mild protein-calorie malnutrition (HCC) 09/06/2020   Status post cervical spinal fusion 08/31/2020   Controlled substance agreement signed 08/30/2020   Chronic pain 08/20/2020   Secondary hyperparathyroidism of renal origin (HCC) 08/15/2020   Allergy, unspecified, sequela 07/21/2020   Coagulation defect, unspecified (HCC) 07/21/2020   Encounter for immunization 07/21/2020  Personal history of anaphylaxis 07/21/2020   Cervical myelopathy (HCC) 07/03/2020   Bilateral bunions 05/08/2020   Hammer toes of both feet 05/08/2020   Onychomycosis of toenail 05/08/2020   Muscle cramps 05/07/2020   History of 2019 novel coronavirus disease (COVID-19) 11/06/2019   Cervical radiculopathy 10/28/2019   Volume overload 06/25/2019   Degenerative disc disease, cervical 04/02/2019   History  of MRSA infection 10/04/2018   Spinal stenosis of lumbosacral region 08/28/2018   Primary osteoarthritis of both shoulders 02/12/2018   Rotator cuff arthropathy of both shoulders 02/12/2018   Lumbar degenerative disc disease 01/25/2018   Chronic midline low back pain without sciatica 12/15/2017   History of colon polyps 08/15/2016   Mild episode of recurrent major depressive disorder (HCC) 05/30/2016   Vitamin D deficiency 04/18/2016   Chronic insomnia 12/21/2015   Pulmonary hypertension, mild (HCC) 04/29/2014   Chronic gastritis without bleeding 01/13/2014   Diabetic polyneuropathy associated with type 2 diabetes mellitus (HCC) 01/13/2014   Chronic diastolic heart failure (HCC) 12/24/2013   OSA (obstructive sleep apnea) 12/24/2013   Primary osteoarthritis of both knees 08/26/2013   Primary osteoarthritis of right knee 08/26/2013   Status post total bilateral knee replacement 05/02/2013   Mixed hyperlipidemia 07/30/2012   Class 3 severe obesity due to excess calories with serious comorbidity and body mass index (BMI) of 45.0 to 49.9 in adult (HCC) 07/27/2012   Gastroesophageal reflux disease without esophagitis 05/12/2008   Erectile dysfunction due to diseases classified elsewhere 01/19/2005   Home Medication(s) Prior to Admission medications   Medication Sig Start Date End Date Taking? Authorizing Provider  acetaminophen (TYLENOL) 325 MG tablet Take 2 tablets (650 mg total) by mouth every 6 (six) hours. 07/23/23   Lockie Mola, MD  amLODipine (NORVASC) 10 MG tablet Take 10 mg by mouth every morning. 04/17/20   [provider]  ASPIRIN LOW DOSE 81 MG EC tablet Take 81 mg by mouth in the morning. 02/22/21   [provider]  atorvastatin (LIPITOR) 40 MG tablet Take 40 mg by mouth in the morning. 02/09/21   [provider]  calcium acetate (PHOSLO) 667 MG capsule Take 2 capsules (1,334 mg total) by mouth with breakfast, with lunch, and with evening meal. 04/10/21    Calvert Cantor, MD  calcium acetate (PHOSLO) 667 MG capsule Take 2 capsules (1,334 mg total) by mouth 3 (three) times daily with meals. 11/04/23 12/04/23  Tegeler, Canary Brim, MD  carvedilol (COREG) 12.5 MG tablet Take 12.5 mg by mouth 2 (two) times daily with a meal. 04/17/20   [provider]  clopidogrel (PLAVIX) 75 MG tablet Take 1 tablet (75 mg total) by mouth daily. 11/02/22   Rhyne, Ames Coupe, PA-C  diazepam (VALIUM) 5 MG tablet Take 5 mg by mouth 2 (two) times daily as needed for muscle spasms. 12/09/21   [provider]  Fluticasone-Umeclidin-Vilant (TRELEGY ELLIPTA IN) Inhale 1 puff into the lungs daily.    [provider]  insulin lispro (HUMALOG KWIKPEN) 100 UNIT/ML KwikPen Inject 0-9 Units into the skin 3 (three) times daily with meals. CBG < 70: treat low blood sugar CBG 70 - 120: 0 units  CBG 121 - 150: 1 unit  CBG 151 - 200: 2 units  CBG 201 - 250: 3 units  CBG 251 - 300: 5 units  CBG 301 - 350: 7 units  CBG 351 - 400: 9 units  CBG > 400: call MD 09/27/23   Zigmund Daniel., MD  insulin regular  human CONCENTRATED (HUMULIN R U-500 KWIKPEN) 500 UNIT/ML KwikPen Inject 55 Units into the skin 2 (two) times daily with a meal. Follow up with your PCP and [facility] for additional adjustments 09/27/23 10/27/23  Zigmund Daniel., MD  iron polysaccharides (NIFEREX) 150 MG capsule Take 1 capsule (150 mg total) by mouth daily. 09/26/23   Meredeth Ide, MD  nortriptyline (PAMELOR) 10 MG capsule Take 20 mg by mouth at bedtime.    [provider]  pantoprazole (PROTONIX) 40 MG tablet Take 40 mg by mouth daily before breakfast. 02/09/21   [provider]  pregabalin (LYRICA) 75 MG capsule Take 1 capsule (75 mg total) by mouth See admin instructions. Daily.  Give after dialysis on dialysis days. Patient taking differently: Take 75 mg by mouth daily. 01/21/23   [name], MD  sertraline (ZOLOFT) 100 MG tablet Take 100 mg by mouth in the  morning. 02/22/21   [provider]  tamsulosin (FLOMAX) 0.4 MG CAPS capsule Take 1 capsule (0.4 mg total) by mouth daily. 11/10/21   [name], MD  torsemide (DEMADEX) 20 MG tablet Take 120 mg by mouth 2 (two) times daily.    [provider]  traZODone (DESYREL) 150 MG tablet Take 150 mg by mouth at bedtime. 12/09/21   [provider]  Vitamin D, Ergocalciferol, (DRISDOL) 1.25 MG (50000 UNIT) CAPS capsule Take 1 capsule (50,000 Units total) by mouth every 7 (seven) days. 10/02/23   Meredeth Ide, MD                                                                                                                                    Past Surgical History Past Surgical History:  Procedure Laterality Date   A/V FISTULAGRAM N/A 08/30/2021   Procedure: A/V FISTULAGRAM;  Surgeon: Maeola Harman, MD;  Location: [facility] INVASIVE CV LAB;  Service: Cardiovascular;  Laterality: N/A;   A/V FISTULAGRAM Right 07/28/2022   Procedure: A/V Fistulagram;  Surgeon: Maeola Harman, MD;  Location: [facility] INVASIVE CV LAB;  Service: Cardiovascular;  Laterality: Right;   A/V FISTULAGRAM Right 09/05/2022   Procedure: A/V Fistulagram;  Surgeon: Maeola Harman, MD;  Location: [facility] INVASIVE CV LAB;  Service: Cardiovascular;  Laterality: Right;   A/V FISTULAGRAM Right 10/31/2022   Procedure: A/V Fistulagram;  Surgeon: Maeola Harman, MD;  Location: [facility] INVASIVE CV LAB;  Service: Cardiovascular;  Laterality: Right;   A/V SHUNT INTERVENTION Right 10/31/2022   Procedure: A/V SHUNT INTERVENTION;  Surgeon: Maeola Harman, MD;  Location: [facility] INVASIVE CV LAB;  Service: Cardiovascular;  Laterality: Right;   A/V SHUNTOGRAM N/A 11/08/2021   Procedure: A/V SHUNTOGRAM;  Surgeon: Maeola Harman, MD;  Location: [facility] INVASIVE CV LAB;  Service: Cardiovascular;  Laterality: N/A;   AMPUTATION Left 04/03/2021   Procedure: LEFT ABOVE-THE-KNEE AMPUTATION;  Surgeon: Terance Hart, MD;  Location: [facility] OR;  Service: Orthopedics;  Laterality:  Left;   AV FISTULA PLACEMENT Right 08/12/2021   Procedure: INSERTION OF ARTERIOVENOUS (AV) GORE-TEX GRAFT RIGHT ARM;  Surgeon: Nada Libman, MD;  Location: [facility] OR;  Service: Vascular;  Laterality: Right;   AV FISTULA PLACEMENT Left 04/07/2023   Procedure: ARTERIOVENOUS (AV) FISTULA GRAFT USING GORETEX STRETCH (4-7MM);  Surgeon: Chuck Hint, MD;  Location: [facility] OR;  Service: Vascular;  Laterality: Left;   INSERTION OF DIALYSIS CATHETER N/A 11/25/2022   Procedure: INSERTION OF DIALYSIS CATHETER;  Surgeon: Lorin Glass, MD;  Location: [facility] ENDOSCOPY;  Service: Pulmonary;  Laterality: N/A;   INSERTION OF DIALYSIS CATHETER N/A 03/03/2023   Procedure: INSERTION OF TUNNELED DIALYSIS CATHETER;  Surgeon: Maeola Harman, MD;  Location: [facility] OR;  Service: Vascular;  Laterality: N/A;   INSERTION OF DIALYSIS CATHETER Left 04/07/2023   Procedure: INSERTION OF DIALYSIS CATHETER USING PALINDROME  28CM PRECISION CHRONIC CATHETER KIT;  Surgeon: Chuck Hint, MD;  Location: [facility] OR;  Service: Vascular;  Laterality: Left;   INSERTION OF DIALYSIS CATHETER Left 07/18/2023   Procedure: INSERTION OF TUNNELED DIALYSIS CATHETER USING 23cm PALINDROME CHRONIC CATHETER;  Surgeon: Chuck Hint, MD;  Location: [facility] OR;  Service: Vascular;  Laterality: Left;   IR REMOVAL TUN CV CATH W/O FL  03/30/2021   JOINT REPLACEMENT     Bilateral knees   LIGATION ARTERIOVENOUS GORTEX GRAFT Right 03/03/2023   Procedure: LIGATION RIGHT ARM ARTERIOVENOUS GORTEX GRAFT;  Surgeon: Maeola Harman, MD;  Location: [facility] OR;  Service: Vascular;  Laterality: Right;   PERIPHERAL VASCULAR BALLOON ANGIOPLASTY Right 08/30/2021   Procedure: PERIPHERAL VASCULAR BALLOON ANGIOPLASTY;  Surgeon: Maeola Harman, MD;  Location: [facility] INVASIVE CV LAB;  Service: Cardiovascular;  Laterality: Right;   PERIPHERAL VASCULAR BALLOON ANGIOPLASTY Right  07/28/2022   Procedure: PERIPHERAL VASCULAR BALLOON ANGIOPLASTY;  Surgeon: Maeola Harman, MD;  Location: [facility] INVASIVE CV LAB;  Service: Cardiovascular;  Laterality: Right;  AVF   PERIPHERAL VASCULAR INTERVENTION  11/08/2021   Procedure: PERIPHERAL VASCULAR INTERVENTION;  Surgeon: Maeola Harman, MD;  Location: [facility] INVASIVE CV LAB;  Service: Cardiovascular;;  Central rt arm fistula   UPPER EXTREMITY VENOGRAPHY Right 08/10/2021   Procedure: UPPER EXTREMITY VENOGRAPHY;  Surgeon: Nada Libman, MD;  Location: [facility] INVASIVE CV LAB;  Service: Cardiovascular;  Laterality: Right;   Family History No family history on file.  Social History Social History   Tobacco Use   Smoking status: Former    Current packs/day: 0.00    Types: Cigarettes    Quit date: 2012    Years since quitting: 12.9   Smokeless tobacco: Never  Vaping Use   Vaping status: Never Used  Substance Use Topics   Alcohol use: Yes    Comment: occasionally   Drug use: Not Currently    Comment: Years ago   Allergies Actos [pioglitazone], Dexmedetomidine, Ibuprofen, Tomato, and Wellbutrin [bupropion]  Review of Systems Review of Systems  Respiratory:  Positive for shortness of breath.   Musculoskeletal:  Positive for arthralgias.    Physical Exam Vital Signs  I have reviewed the triage vital signs BP (!) 164/60 (BP Location: Right Arm)   Pulse 95   Temp 98.3 F (36.8 C) (Oral)   Resp 20   SpO2 99%   Physical Exam Constitutional:      General: He is not in acute distress.    Appearance: Normal appearance.  HENT:     Head: Normocephalic and atraumatic.     Nose: No congestion or rhinorrhea.  Eyes:     General:        Right eye: No discharge.        Left eye: No discharge.     Extraocular Movements: Extraocular movements intact.     Pupils: Pupils are equal, round, and reactive to light.  Cardiovascular:     Rate and Rhythm: Normal rate and regular rhythm.     Heart sounds: No murmur  heard. Pulmonary:     Effort: No respiratory distress.     Breath sounds: No wheezing or rales.  Abdominal:     General: There is no distension.     Tenderness: There is no abdominal tenderness.  Musculoskeletal:        General: Normal range of motion.     Cervical back: Normal range of motion.  Skin:    General: Skin is warm and dry.  Neurological:     General: No focal deficit present.     Mental Status: He is alert.     ED Results and Treatments Labs (all labs ordered are listed, but only abnormal results are displayed) Labs Reviewed - No data to display                                                                                                                        Radiology No results found.  Pertinent labs & imaging results that were available during my care of the patient were reviewed by me and considered in my medical decision making (see MDM for details).  Medications Ordered in ED Medications - No data to display                                                                                                                                   Procedures Procedures  (including critical care time)  Medical Decision Making / ED Course   This patient presents to the ED for concern of need for routine dialysis, this involves an extensive number of treatment options, and is a complaint that carries with it a high risk of complications and morbidity.  The differential diagnosis includes need for routine dialysis, ESRD, pneumonia, fluid overload, wrist sprain  MDM: Patient seen emergency room for evaluation of need for routine dialysis.  Physical exam largely unremarkable.  Spoke with nephrology on-call who helped arrange routine dialysis and patient went for dialysis.  Will likely be okay for discharge after dialysis.  Additional history obtained:  -External records from outside source obtained and reviewed including: Chart review including previous notes, labs,  imaging, consultation notes   Medicines ordered and prescription drug management: Meds ordered this encounter  Medications   DISCONTD: Chlorhexidine Gluconate Cloth 2 % PADS 6 each   DISCONTD: heparin sodium (porcine) 1000 UNIT/ML injection    Rosser, Na'Shaminy N: cabinet override   DISCONTD: heparin sodium (porcine) 1000 UNIT/ML injection    Rosser, Na'Shaminy N: cabinet override    -I have reviewed the patients home medicines and have made adjustments as needed  Critical interventions none  Social Determinants of Health:  Factors impacting patients care include: none   Co morbidities that complicate the patient evaluation  Past Medical History:  Diagnosis Date   Altered mental status 05/04/2021   Arthritis    Blood transfusion without reported diagnosis    CHF (congestive heart failure) (HCC)    COPD (chronic obstructive pulmonary disease) (HCC)    Depression    Diabetes mellitus without complication (HCC)    type 2   Dyspnea    ESRD on hemodialysis (HCC)    Tues Thurs Sat   GERD (gastroesophageal reflux disease)    protonix   Heart murmur    as a child, no problems   Hypertension    Myocardial infarction (HCC)    PTSD (post-traumatic stress disorder)    Sepsis (HCC)    Sleep apnea    occasional uses CPAP   Uses powered wheelchair       Dispostion: I considered admission for this patient, and patient will receive dialysis and likely be discharged     Final Clinical Impression(s) / ED Diagnoses Final diagnoses:  ESRD (end stage renal disease) (HCC)     @PCDICTATION @    Glendora Score, MD 11/07/23 1250

## 2023-11-09 ENCOUNTER — Other Ambulatory Visit: Payer: Self-pay

## 2023-11-09 ENCOUNTER — Emergency Department (HOSPITAL_COMMUNITY)
Admission: EM | Admit: 2023-11-09 | Discharge: 2023-11-09 | Disposition: A | Discharge status: Home / Self Care | Payer: 59 | Attending: Emergency Medicine | Admitting: Emergency Medicine

## 2023-11-09 ENCOUNTER — Encounter (HOSPITAL_COMMUNITY): Payer: Self-pay | Admitting: Emergency Medicine

## 2023-11-09 DIAGNOSIS — Z992 Dependence on renal dialysis: Secondary | ICD-10-CM | POA: Diagnosis not present

## 2023-11-09 DIAGNOSIS — N186 End stage renal disease: Secondary | ICD-10-CM | POA: Insufficient documentation

## 2023-11-09 LAB — CBC WITH DIFFERENTIAL/PLATELET
Abs Immature Granulocytes: 0.18 10*3/uL — ABNORMAL HIGH (ref 0.00–0.07)
Basophils Absolute: 0 10*3/uL (ref 0.0–0.1)
Basophils Relative: 0 %
Eosinophils Absolute: 0.1 10*3/uL (ref 0.0–0.5)
Eosinophils Relative: 2 %
HCT: 31.3 % — ABNORMAL LOW (ref 39.0–52.0)
Hemoglobin: 9.9 g/dL — ABNORMAL LOW (ref 13.0–17.0)
Immature Granulocytes: 3 %
Lymphocytes Relative: 9 %
Lymphs Abs: 0.6 10*3/uL — ABNORMAL LOW (ref 0.7–4.0)
MCH: 28.9 pg (ref 26.0–34.0)
MCHC: 31.6 g/dL (ref 30.0–36.0)
MCV: 91.5 fL (ref 80.0–100.0)
Monocytes Absolute: 0.5 10*3/uL (ref 0.1–1.0)
Monocytes Relative: 7 %
Neutro Abs: 5.4 10*3/uL (ref 1.7–7.7)
Neutrophils Relative %: 79 %
Platelets: 122 10*3/uL — ABNORMAL LOW (ref 150–400)
RBC: 3.42 MIL/uL — ABNORMAL LOW (ref 4.22–5.81)
RDW: 15 % (ref 11.5–15.5)
WBC: 6.7 10*3/uL (ref 4.0–10.5)
nRBC: 0 % (ref 0.0–0.2)

## 2023-11-09 LAB — RENAL FUNCTION PANEL
Albumin: 3.5 g/dL (ref 3.5–5.0)
Anion gap: 13 (ref 5–15)
BUN: 81 mg/dL — ABNORMAL HIGH (ref 8–23)
CO2: 24 mmol/L (ref 22–32)
Calcium: 8.7 mg/dL — ABNORMAL LOW (ref 8.9–10.3)
Chloride: 99 mmol/L (ref 98–111)
Creatinine, Ser: 5.55 mg/dL — ABNORMAL HIGH (ref 0.61–1.24)
GFR, Estimated: 11 mL/min — ABNORMAL LOW (ref >60)
Glucose, Bld: 270 mg/dL — ABNORMAL HIGH (ref 70–99)
Phosphorus: 5.6 mg/dL — ABNORMAL HIGH (ref 2.5–4.6)
Potassium: 4.7 mmol/L (ref 3.5–5.1)
Sodium: 136 mmol/L (ref 135–145)

## 2023-11-09 MED ORDER — NEPRO/CARBSTEADY PO LIQD
237.0000 mL | ORAL | Status: DC | PRN
Start: 2023-11-09 — End: 2023-11-09
  Filled 2023-11-09: qty 237

## 2023-11-09 MED ORDER — LIDOCAINE HCL (PF) 1 % IJ SOLN: 5.0000 mL | INTRAMUSCULAR | Status: DC | PRN

## 2023-11-09 MED ORDER — HEPARIN SODIUM (PORCINE) 1000 UNIT/ML DIALYSIS: 1000.0000 [IU] | INTRAMUSCULAR | Status: DC | PRN

## 2023-11-09 MED ORDER — PENTAFLUOROPROP-TETRAFLUOROETH EX AERO: 1.0000 | INHALATION_SPRAY | CUTANEOUS | Status: DC | PRN

## 2023-11-09 MED ORDER — LIDOCAINE-PRILOCAINE 2.5-2.5 % EX CREA: 1.0000 | TOPICAL_CREAM | CUTANEOUS | Status: DC | PRN

## 2023-11-09 MED ORDER — ANTICOAGULANT SODIUM CITRATE 4% (200MG/5ML) IV SOLN
5.0000 mL | Status: DC | PRN
  Filled 2023-11-09: qty 5

## 2023-11-09 MED ORDER — CHLORHEXIDINE GLUCONATE CLOTH 2 % EX PADS
6.0000 | MEDICATED_PAD | Freq: Every day | CUTANEOUS | Status: DC
Start: 2023-11-09 — End: 2023-11-09

## 2023-11-09 MED ORDER — HEPARIN SODIUM (PORCINE) 1000 UNIT/ML DIALYSIS
2000.0000 [IU] | Freq: Once | INTRAMUSCULAR | Status: AC
  Administered 2023-11-09: 2000 [IU] via INTRAVENOUS_CENTRAL

## 2023-11-09 MED ORDER — ALTEPLASE 2 MG IJ SOLR: 2.0000 mg | Freq: Once | INTRAMUSCULAR | Status: DC | PRN

## 2023-11-09 NOTE — ED Provider Notes (Signed)
  Physical Exam  BP (!) 157/66   Pulse 89   Temp 98.1 F (36.7 C)   Resp 20   Ht 5\' 5"  (1.651 m)   Wt 136.1 kg   SpO2 99%   BMI 49.92 kg/m   Physical Exam  Procedures  Procedures  ED Course / MDM    Medical Decision Making Patient is here to get dialysis.  Patient missed several sessions.  Patient is on oxygen at baseline.  Reviewed patient's labs from earlier and they are at baseline.  Patient is feeling better after dialysis.  Minimal crackles bilateral bases and patient is doing well on his baseline 3 L.  Stable for discharge  Problems Addressed: Encounter for hemodialysis for ESRD [facility]): acute illness or injury          Charlynne Pander, MD 11/09/23 1729

## 2023-11-09 NOTE — Discharge Instructions (Signed)
You received dialysis today.  Please get your dialysis sessions regularly  See your nephrologist for follow-up  Return to ER if you have worse shortness of breath or abdominal pain or vomiting or chest pain

## 2023-11-09 NOTE — ED Triage Notes (Signed)
Pt here for dialysis.  Pt is in no distress. Pt has not other complaints.

## 2023-11-09 NOTE — Progress Notes (Signed)
   11/09/23 1643  Vitals  Temp 98.1 F (36.7 C)  Pulse Rate 89  Resp 20  BP (!) 157/66  SpO2 99 %  O2 Device Nasal Cannula  Weight  (unable to weigh--ed stretcher)  Type of Weight Post-Dialysis  Oxygen Therapy  O2 Flow Rate (L/min) 3 L/min  Patient Activity (if Appropriate) In bed  Pulse Oximetry Type Continuous  Oximetry Probe Site Changed No  Post Treatment  Dialyzer Clearance Lightly streaked  Hemodialysis Intake (mL) 0 mL  Liters Processed 78  Fluid Removed (mL) 3000 mL  Tolerated HD Treatment Yes  AVG/AVF Arterial Site Held (minutes) 10 minutes  AVG/AVF Venous Site Held (minutes) 10 minutes  During Treatment Monitoring  Duration of HD Treatment -hour(s) 3.25 hour(s)   Received patient in bed to unit.  Alert and oriented.  Informed consent signed and in chart.   TX duration:3.25  Patient tolerated well.  Transported back to ED Alert, without acute distress.  Hand-off given to patient's nurse.   Access used: LUAG Access issues: no complications  Total UF removed: 3000 Medication(s) given: none   [facility]

## 2023-11-09 NOTE — ED Provider Notes (Signed)
[place] EMERGENCY DEPARTMENT AT [facility] Provider Note   CSN: [id number] Arrival date & time: 11/09/23  1324     History  Chief Complaint  Patient presents with   needs Dialysis    Jeffrey [name] is a 62 y.o. male.  Jeffrey [name] is a 62 y.o. 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  He reports that he was last dialyzed on Saturday, missed Tuesday session due to neck pain which has been an ongoing chronic issue which is being worked up by his primary care provider, had CT in October that showed chronic degenerative changes.  Patient reports he is at his baseline oxygen requirement and feels that his breathing is at baseline.  He denies other symptoms or complaints at this time.  Patient was dismissed from his dialysis center and has been receiving dialysis through the ED since December 2023.  The history is provided by the patient.       Home Medications Prior to Admission medications   Medication Sig Start Date End Date Taking? Authorizing Provider  acetaminophen (TYLENOL) 325 MG tablet Take 2 tablets (650 mg total) by mouth every 6 (six) hours. 07/23/23   Lockie Mola, MD  amLODipine (NORVASC) 10 MG tablet Take 10 mg by mouth every morning. 04/17/20   [provider]  ASPIRIN LOW DOSE 81 MG EC tablet Take 81 mg by mouth in the morning. 02/22/21   [provider]  atorvastatin (LIPITOR) 40 MG tablet Take 40 mg by mouth in the morning. 02/09/21   [provider]  calcium acetate (PHOSLO) 667 MG capsule Take 2 capsules (1,334 mg total) by mouth with breakfast, with lunch, and with evening meal. 04/10/21   Calvert Cantor, MD  calcium acetate (PHOSLO) 667 MG capsule Take 2 capsules (1,334 mg total) by mouth 3 (three) times daily with meals. 11/04/23 12/04/23  Tegeler, Canary Brim, MD  carvedilol (COREG) 12.5 MG tablet Take 12.5 mg by mouth 2 (two) times daily with a meal. 04/17/20   [provider]   clopidogrel (PLAVIX) 75 MG tablet Take 1 tablet (75 mg total) by mouth daily. 11/02/22   Rhyne, Ames Coupe, PA-C  diazepam (VALIUM) 5 MG tablet Take 5 mg by mouth 2 (two) times daily as needed for muscle spasms. 12/09/21   [provider]  Fluticasone-Umeclidin-Vilant (TRELEGY ELLIPTA IN) Inhale 1 puff into the lungs daily.    [provider]  insulin lispro (HUMALOG KWIKPEN) 100 UNIT/ML KwikPen Inject 0-9 Units into the skin 3 (three) times daily with meals. CBG < 70: treat low blood sugar CBG 70 - 120: 0 units  CBG 121 - 150: 1 unit  CBG 151 - 200: 2 units  CBG 201 - 250: 3 units  CBG 251 - 300: 5 units  CBG 301 - 350: 7 units  CBG 351 - 400: 9 units  CBG > 400: call MD 09/27/23   Zigmund Daniel., MD  insulin regular human CONCENTRATED (HUMULIN R U-500 KWIKPEN) 500 UNIT/ML KwikPen Inject 55 Units into the skin 2 (two) times daily with a meal. Follow up with your PCP and [facility] for additional adjustments 09/27/23 10/27/23  Zigmund Daniel., MD  iron polysaccharides (NIFEREX) 150 MG capsule Take 1 capsule (150 mg total) by mouth daily. 09/26/23   Meredeth Ide, MD  nortriptyline (PAMELOR) 10 MG capsule Take 20 mg by mouth at bedtime.    [provider]  pantoprazole (PROTONIX) 40 MG tablet Take 40 mg by mouth daily before breakfast. 02/09/21   [provider]  pregabalin (LYRICA) 75 MG capsule Take 1 capsule (75 mg total) by mouth See admin instructions. Daily.  Give after dialysis on dialysis days. Patient taking differently: Take 75 mg by mouth daily. 01/21/23   [name], MD  sertraline (ZOLOFT) 100 MG tablet Take 100 mg by mouth in the morning. 02/22/21   [provider]  tamsulosin (FLOMAX) 0.4 MG CAPS capsule Take 1 capsule (0.4 mg total) by mouth daily. 11/10/21   [name], MD  torsemide (DEMADEX) 20 MG tablet Take 120 mg by mouth 2 (two) times daily.    [provider]  traZODone (DESYREL) 150 MG tablet Take  150 mg by mouth at bedtime. 12/09/21   [provider]  Vitamin D, Ergocalciferol, (DRISDOL) 1.25 MG (50000 UNIT) CAPS capsule Take 1 capsule (50,000 Units total) by mouth every 7 (seven) days. 10/02/23   Meredeth Ide, MD      Allergies    Actos [pioglitazone], Dexmedetomidine, Ibuprofen, Tomato, and Wellbutrin [bupropion]    Review of Systems   Review of Systems  Constitutional:  Negative for chills and fever.  Respiratory:  Negative for shortness of breath.     Physical Exam Updated Vital Signs BP (!) 158/55 (BP Location: Right Arm)   Pulse 84   Temp 98 F (36.7 C)   Resp 18   Ht 5\' 5"  (1.651 m)   Wt 136.1 kg   SpO2 99%   BMI 49.92 kg/m  Physical Exam Vitals and nursing note reviewed.  Constitutional:      General: He is not in acute distress.    Appearance: Normal appearance. He is well-developed. He is not ill-appearing or diaphoretic.     Comments: Alert, sitting in powered wheelchair in no distress listening to music.  HENT:     Head: Normocephalic and atraumatic.  Eyes:     General:        Right eye: No discharge.        Left eye: No discharge.  Cardiovascular:     Rate and Rhythm: Normal rate and regular rhythm.     Heart sounds: Normal heart sounds.  Pulmonary:     Effort: Pulmonary effort is normal. No respiratory distress.     Breath sounds: Rales present.     Comments: On 3 L nasal cannula (at baseline), normal respiratory effort, faint crackles noted in bases, otherwise lungs clear to auscultation Neurological:     Mental Status: He is alert and oriented to person, place, and time.     Coordination: Coordination normal.  Psychiatric:        Mood and Affect: Mood normal.        Behavior: Behavior normal.     ED Results / Procedures / Treatments   Labs (all labs ordered are listed, but only abnormal results are displayed) Labs Reviewed - No data to display  EKG None  Radiology No results found.  Procedures Procedures     Medications Ordered in ED Medications  Chlorhexidine Gluconate Cloth 2 % PADS 6 each (has no administration in time range)    ED Course/ Medical Decision Making/ A&P                                 Medical Decision Making  Patient presents for routine dialysis, missed session this past Tuesday but  not experiencing any symptoms, at baseline oxygen requirement.  Case discussed with Dr. Arlean Hopping with nephrology, will coordinate dialysis for patient today, anticipate that patient will return to the emergency department be discharged after dialysis.        Final Clinical Impression(s) / ED Diagnoses Final diagnoses:  Encounter for hemodialysis for ESRD [facility])    Rx / DC Orders ED Discharge Orders     None         Rosezella Rumpf, PA-C 11/09/23 1200    Lonell Grandchild, MD 11/09/23 1354

## 2023-11-09 NOTE — Procedures (Signed)
Asked to see patient for hospital dialysis. Patient is not being admitted. The plan will be for "ED HD". Pt will go to the dialysis unit upstairs when they are ready for the patient. When dialysis is completed pt will be sent back to ED for reassessment.      Last OP HD orders (dec 2023):  3:15h  129kg  RUE AVG   Heparin 2000       Clinical issues: 1) Anemia of esrd:  Pt had darbe in late April and in early May, but Hb continued to drop, so dosing of darbepoeitin was increased up to 100 and then 150 mcg q 1-2 wks.  Pt required two 1 gm IV Fe loading programs during the summer and early fall to get the Hb to respond up to Hb 8-10. In Sept / Oct 2024, Hb dropped down back into the 8's. We ^'d the darbepoetin to 200 mcg every 1-2 wks. Recent Hb's in mid Dec 2024 are 9-10 range.    Plan: Cont darbepoetin w/ ^ to 200 mcg every 1-2 wks in hospital (pt does not have outpatient unit to get this medication).  Last 3 doses - 10/20, 11/19, 12/03 darbepoetin 200 micrograms Next dose next week sometime    Darbepoeitin needs to ordered as "once"  or "once in dialysis". The order will default to "SQ at 1800" but that timing is not good because he typically needs to be ready to leave at 5 pm to catch his ride home.    I have reviewed the HD regimen and made appropriate changes.  Vinson Moselle MD  [facility] 11/09/2023, 4:00 PM

## 2023-11-11 ENCOUNTER — Emergency Department (HOSPITAL_COMMUNITY)
Admission: EM | Admit: 2023-11-11 | Discharge: 2023-11-11 | Disposition: A | Discharge status: Home / Self Care | Payer: 59 | Attending: Emergency Medicine | Admitting: Emergency Medicine

## 2023-11-11 ENCOUNTER — Other Ambulatory Visit: Payer: Self-pay

## 2023-11-11 ENCOUNTER — Encounter (HOSPITAL_COMMUNITY): Payer: Self-pay

## 2023-11-11 DIAGNOSIS — N186 End stage renal disease: Secondary | ICD-10-CM | POA: Insufficient documentation

## 2023-11-11 DIAGNOSIS — I132 Hypertensive heart and chronic kidney disease with heart failure and with stage 5 chronic kidney disease, or end stage renal disease: Secondary | ICD-10-CM | POA: Insufficient documentation

## 2023-11-11 DIAGNOSIS — Z7901 Long term (current) use of anticoagulants: Secondary | ICD-10-CM | POA: Diagnosis not present

## 2023-11-11 DIAGNOSIS — E1122 Type 2 diabetes mellitus with diabetic chronic kidney disease: Secondary | ICD-10-CM | POA: Insufficient documentation

## 2023-11-11 DIAGNOSIS — I509 Heart failure, unspecified: Secondary | ICD-10-CM | POA: Diagnosis not present

## 2023-11-11 DIAGNOSIS — Z794 Long term (current) use of insulin: Secondary | ICD-10-CM | POA: Insufficient documentation

## 2023-11-11 DIAGNOSIS — Z7982 Long term (current) use of aspirin: Secondary | ICD-10-CM | POA: Insufficient documentation

## 2023-11-11 DIAGNOSIS — Z992 Dependence on renal dialysis: Secondary | ICD-10-CM | POA: Diagnosis not present

## 2023-11-11 DIAGNOSIS — I2510 Atherosclerotic heart disease of native coronary artery without angina pectoris: Secondary | ICD-10-CM | POA: Insufficient documentation

## 2023-11-11 MED ORDER — DARBEPOETIN ALFA 200 MCG/0.4ML IJ SOSY
200.0000 ug | PREFILLED_SYRINGE | Freq: Once | INTRAMUSCULAR | Status: DC
  Filled 2023-11-11: qty 0.4

## 2023-11-11 MED ORDER — HEPARIN SODIUM (PORCINE) 1000 UNIT/ML DIALYSIS: 2000.0000 [IU] | Freq: Once | INTRAMUSCULAR | Status: DC

## 2023-11-11 MED ORDER — CHLORHEXIDINE GLUCONATE CLOTH 2 % EX PADS
6.0000 | MEDICATED_PAD | Freq: Every day | CUTANEOUS | Status: DC
Start: 2023-11-11 — End: 2023-11-11

## 2023-11-11 NOTE — ED Notes (Signed)
Risks and benefits were explained to patient. Patient chose to leave AMA due to transportation and wait times. PA Meredith notified.

## 2023-11-11 NOTE — ED Provider Notes (Signed)
[place] EMERGENCY DEPARTMENT AT [facility] Provider Note   CSN: [id number] Arrival date & time: 11/11/23  1308     History  Chief Complaint  Patient presents with   NEEDS DIALYSIS    Jeffrey [name] is a 62 y.o. male with PMHx ESRD on HD (T,Th,Sat) who presents to the ED for dialysis. Last dialysis session 2 days ago.  Denies fever, chest pain, dyspnea, cough, nausea, vomiting, diarrhea, abdominal pain.  HPI     Home Medications Prior to Admission medications   Medication Sig Start Date End Date Taking? Authorizing Provider  acetaminophen (TYLENOL) 325 MG tablet Take 2 tablets (650 mg total) by mouth every 6 (six) hours. 07/23/23   Lockie Mola, MD  amLODipine (NORVASC) 10 MG tablet Take 10 mg by mouth every morning. 04/17/20   [provider]  ASPIRIN LOW DOSE 81 MG EC tablet Take 81 mg by mouth in the morning. 02/22/21   [provider]  atorvastatin (LIPITOR) 40 MG tablet Take 40 mg by mouth in the morning. 02/09/21   [provider]  calcium acetate (PHOSLO) 667 MG capsule Take 2 capsules (1,334 mg total) by mouth with breakfast, with lunch, and with evening meal. 04/10/21   Calvert Cantor, MD  calcium acetate (PHOSLO) 667 MG capsule Take 2 capsules (1,334 mg total) by mouth 3 (three) times daily with meals. 11/04/23 12/04/23  Tegeler, Canary Brim, MD  carvedilol (COREG) 12.5 MG tablet Take 12.5 mg by mouth 2 (two) times daily with a meal. 04/17/20   [provider]  clopidogrel (PLAVIX) 75 MG tablet Take 1 tablet (75 mg total) by mouth daily. 11/02/22   Rhyne, Ames Coupe, PA-C  diazepam (VALIUM) 5 MG tablet Take 5 mg by mouth 2 (two) times daily as needed for muscle spasms. 12/09/21   [provider]  Fluticasone-Umeclidin-Vilant (TRELEGY ELLIPTA IN) Inhale 1 puff into the lungs daily.    [provider]  insulin lispro (HUMALOG KWIKPEN) 100 UNIT/ML KwikPen Inject 0-9 Units into the skin 3 (three) times daily with  meals. CBG < 70: treat low blood sugar CBG 70 - 120: 0 units  CBG 121 - 150: 1 unit  CBG 151 - 200: 2 units  CBG 201 - 250: 3 units  CBG 251 - 300: 5 units  CBG 301 - 350: 7 units  CBG 351 - 400: 9 units  CBG > 400: call MD 09/27/23   Zigmund Daniel., MD  insulin regular human CONCENTRATED (HUMULIN R U-500 KWIKPEN) 500 UNIT/ML KwikPen Inject 55 Units into the skin 2 (two) times daily with a meal. Follow up with your PCP and [facility] for additional adjustments 09/27/23 10/27/23  Zigmund Daniel., MD  iron polysaccharides (NIFEREX) 150 MG capsule Take 1 capsule (150 mg total) by mouth daily. 09/26/23   Meredeth Ide, MD  nortriptyline (PAMELOR) 10 MG capsule Take 20 mg by mouth at bedtime.    [provider]  pantoprazole (PROTONIX) 40 MG tablet Take 40 mg by mouth daily before breakfast. 02/09/21   [provider]  pregabalin (LYRICA) 75 MG capsule Take 1 capsule (75 mg total) by mouth See admin instructions. Daily.  Give after dialysis on dialysis days. Patient taking differently: Take 75 mg by mouth daily. 01/21/23   [name], MD  sertraline (ZOLOFT) 100 MG tablet Take 100 mg by mouth in the morning. 02/22/21   [provider]  tamsulosin (FLOMAX) 0.4 MG CAPS capsule Take 1 capsule (  0.4 mg total) by mouth daily. 11/10/21   [name], MD  torsemide (DEMADEX) 20 MG tablet Take 120 mg by mouth 2 (two) times daily.    [provider]  traZODone (DESYREL) 150 MG tablet Take 150 mg by mouth at bedtime. 12/09/21   [provider]  Vitamin D, Ergocalciferol, (DRISDOL) 1.25 MG (50000 UNIT) CAPS capsule Take 1 capsule (50,000 Units total) by mouth every 7 (seven) days. 10/02/23   Meredeth Ide, MD      Allergies    Actos [pioglitazone], Dexmedetomidine, Ibuprofen, Tomato, and Wellbutrin [bupropion]    Review of Systems   Review of Systems  Genitourinary:        Dilaysis    Physical Exam Updated Vital Signs BP (!) 157/74 (BP  Location: Right Wrist)   Pulse 80   Temp 97.9 F (36.6 C) (Oral)   Resp 20   Ht 5\' 5"  (1.651 m)   Wt 136.1 kg   SpO2 97%   BMI 49.92 kg/m  Physical Exam Vitals and nursing note reviewed.  Constitutional:      General: He is not in acute distress.    Appearance: He is not ill-appearing or toxic-appearing.  HENT:     Head: Normocephalic and atraumatic.     Mouth/Throat:     Mouth: Mucous membranes are moist.  Eyes:     General: No scleral icterus.       Right eye: No discharge.        Left eye: No discharge.     Conjunctiva/sclera: Conjunctivae normal.  Cardiovascular:     Rate and Rhythm: Normal rate and regular rhythm.     Pulses: Normal pulses.     Heart sounds: Normal heart sounds. No murmur heard. Pulmonary:     Effort: Pulmonary effort is normal. No respiratory distress.     Breath sounds: Normal breath sounds. No wheezing, rhonchi or rales.  Abdominal:     General: Abdomen is flat. Bowel sounds are normal.  Musculoskeletal:     Right lower leg: No edema.     Left lower leg: No edema.  Skin:    General: Skin is warm and dry.     Findings: No rash.  Neurological:     General: No focal deficit present.     Mental Status: He is alert. Mental status is at baseline.  Psychiatric:        Mood and Affect: Mood normal.        Behavior: Behavior normal.     ED Results / Procedures / Treatments   Labs (all labs ordered are listed, but only abnormal results are displayed) Labs Reviewed - No data to display  EKG None  Radiology No results found.  Procedures Procedures    Medications Ordered in ED Medications  Chlorhexidine Gluconate Cloth 2 % PADS 6 each (has no administration in time range)  heparin injection 2,000 Units (has no administration in time range)  Darbepoetin Alfa (ARANESP) injection 200 mcg (has no administration in time range)    ED Course/ Medical Decision Making/ A&P                                 Medical Decision Making  This  patient presents to the ED for concern of dialysis, this involves an extensive number of treatment options, and is a complaint that carries with it a high risk of complications and morbidity.  The differential diagnosis  includes electrolyte abnormality, fluid overload   Co morbidities that complicate the patient evaluation  CHF, COPD, DM, ESRD on dialysis, GERD, HTN, CAD   Additional history obtained:  Additional history obtained from 11/09/2023 ED note: patient presented for dialysis   Problem List / ED Course / Critical interventions / Medication management  Patient presenting for dialysis. Receives dialysis treatments here at [facility]. T/Th/Sat schedule. Last session 2 days ago. Denying any symptoms today. Patient afebrile with stable vitals.  Consulted nephrology for dialysis. They agree to take for ED dialysis. Patient eloped before dialysis stating that he waited too long and had transportation issues.  I have reviewed the patients home medicines and have made adjustments as needed   Social Determinants of Health:  none         Final Clinical Impression(s) / ED Diagnoses Final diagnoses:  ESRD on dialysis [facility])    Rx / DC Orders ED Discharge Orders     None         Dorthy Cooler, [place] 11/11/23 1313    Rozelle Logan, DO 11/12/23 1342

## 2023-11-11 NOTE — ED Triage Notes (Signed)
Patient here for his routine dialysis.  No distress.  On his baseline 3L [place].  No sob or chest pain.

## 2023-11-14 ENCOUNTER — Emergency Department (HOSPITAL_COMMUNITY)
Admission: EM | Admit: 2023-11-14 | Discharge: 2023-11-14 | Discharge status: Against Medical Advice | Payer: 59 | Attending: Emergency Medicine | Admitting: Emergency Medicine

## 2023-11-14 ENCOUNTER — Encounter (HOSPITAL_COMMUNITY): Payer: Self-pay

## 2023-11-14 ENCOUNTER — Other Ambulatory Visit: Payer: Self-pay

## 2023-11-14 DIAGNOSIS — Z5321 Procedure and treatment not carried out due to patient leaving prior to being seen by health care provider: Secondary | ICD-10-CM | POA: Insufficient documentation

## 2023-11-14 DIAGNOSIS — Z992 Dependence on renal dialysis: Secondary | ICD-10-CM | POA: Insufficient documentation

## 2023-11-14 LAB — RENAL FUNCTION PANEL
Albumin: 3.4 g/dL — ABNORMAL LOW (ref 3.5–5.0)
Anion gap: 13 (ref 5–15)
BUN: 80 mg/dL — ABNORMAL HIGH (ref 8–23)
CO2: 25 mmol/L (ref 22–32)
Calcium: 8.3 mg/dL — ABNORMAL LOW (ref 8.9–10.3)
Chloride: 99 mmol/L (ref 98–111)
Creatinine, Ser: 5.13 mg/dL — ABNORMAL HIGH (ref 0.61–1.24)
GFR, Estimated: 12 mL/min — ABNORMAL LOW (ref >60)
Glucose, Bld: 291 mg/dL — ABNORMAL HIGH (ref 70–99)
Phosphorus: 5.3 mg/dL — ABNORMAL HIGH (ref 2.5–4.6)
Potassium: 3.7 mmol/L (ref 3.5–5.1)
Sodium: 137 mmol/L (ref 135–145)

## 2023-11-14 LAB — CBC
HCT: 29.8 % — ABNORMAL LOW (ref 39.0–52.0)
Hemoglobin: 9.4 g/dL — ABNORMAL LOW (ref 13.0–17.0)
MCH: 28.9 pg (ref 26.0–34.0)
MCHC: 31.5 g/dL (ref 30.0–36.0)
MCV: 91.7 fL (ref 80.0–100.0)
Platelets: 118 10*3/uL — ABNORMAL LOW (ref 150–400)
RBC: 3.25 MIL/uL — ABNORMAL LOW (ref 4.22–5.81)
RDW: 15 % (ref 11.5–15.5)
WBC: 6.6 10*3/uL (ref 4.0–10.5)
nRBC: 0 % (ref 0.0–0.2)

## 2023-11-14 MED ORDER — CHLORHEXIDINE GLUCONATE CLOTH 2 % EX PADS: 6.0000 | MEDICATED_PAD | Freq: Every day | CUTANEOUS | Status: DC

## 2023-11-14 MED ORDER — HEPARIN SODIUM (PORCINE) 1000 UNIT/ML DIALYSIS
2000.0000 [IU] | Freq: Once | INTRAMUSCULAR | Status: AC
  Administered 2023-11-14: 2000 [IU] via INTRAVENOUS_CENTRAL
  Filled 2023-11-14: qty 2

## 2023-11-14 NOTE — ED Triage Notes (Signed)
Patient here for regular weekly dialysis-no complaints. Alert and oriented

## 2023-11-14 NOTE — ED Notes (Signed)
To dialysis  now 

## 2023-11-14 NOTE — Progress Notes (Addendum)
Received patient in bed. Alert and oriented x 4.Consent verified.  Access used : Left AVG that worked well.  Duration of treatment : 3.25 hrs.  Net UF met 3liters.  Medicine give: Heparin 2,000 unit pre-run dose.  Tolerated treatment; Yes.  Hand off to the patient's nurse.Back to ED with stable medical condition via transporter.

## 2023-11-16 ENCOUNTER — Other Ambulatory Visit: Payer: Self-pay

## 2023-11-16 ENCOUNTER — Emergency Department (HOSPITAL_COMMUNITY)
Admission: EM | Admit: 2023-11-16 | Discharge: 2023-11-16 | Discharge status: Against Medical Advice | Payer: 59 | Attending: Nephrology | Admitting: Nephrology

## 2023-11-16 DIAGNOSIS — Z992 Dependence on renal dialysis: Secondary | ICD-10-CM | POA: Insufficient documentation

## 2023-11-16 DIAGNOSIS — Z5321 Procedure and treatment not carried out due to patient leaving prior to being seen by health care provider: Secondary | ICD-10-CM | POA: Diagnosis not present

## 2023-11-16 DIAGNOSIS — M542 Cervicalgia: Secondary | ICD-10-CM | POA: Insufficient documentation

## 2023-11-16 LAB — CBC
HCT: 32.9 % — ABNORMAL LOW (ref 39.0–52.0)
Hemoglobin: 10.3 g/dL — ABNORMAL LOW (ref 13.0–17.0)
MCH: 28.9 pg (ref 26.0–34.0)
MCHC: 31.3 g/dL (ref 30.0–36.0)
MCV: 92.2 fL (ref 80.0–100.0)
Platelets: 126 10*3/uL — ABNORMAL LOW (ref 150–400)
RBC: 3.57 MIL/uL — ABNORMAL LOW (ref 4.22–5.81)
RDW: 15.2 % (ref 11.5–15.5)
WBC: 6.6 10*3/uL (ref 4.0–10.5)
nRBC: 0 % (ref 0.0–0.2)

## 2023-11-16 LAB — RENAL FUNCTION PANEL
Albumin: 3.3 g/dL — ABNORMAL LOW (ref 3.5–5.0)
Anion gap: 12 (ref 5–15)
BUN: 67 mg/dL — ABNORMAL HIGH (ref 8–23)
CO2: 26 mmol/L (ref 22–32)
Calcium: 8.6 mg/dL — ABNORMAL LOW (ref 8.9–10.3)
Chloride: 98 mmol/L (ref 98–111)
Creatinine, Ser: 5.38 mg/dL — ABNORMAL HIGH (ref 0.61–1.24)
GFR, Estimated: 11 mL/min — ABNORMAL LOW (ref >60)
Glucose, Bld: 313 mg/dL — ABNORMAL HIGH (ref 70–99)
Phosphorus: 4.8 mg/dL — ABNORMAL HIGH (ref 2.5–4.6)
Potassium: 3.9 mmol/L (ref 3.5–5.1)
Sodium: 136 mmol/L (ref 135–145)

## 2023-11-16 MED ORDER — CHLORHEXIDINE GLUCONATE CLOTH 2 % EX PADS: 6.0000 | MEDICATED_PAD | Freq: Every day | CUTANEOUS | Status: DC

## 2023-11-16 MED ORDER — HEPARIN SODIUM (PORCINE) 1000 UNIT/ML IJ SOLN
INTRAMUSCULAR | Status: AC
  Administered 2023-11-16: 2000 [IU]
  Filled 2023-11-16: qty 3

## 2023-11-16 NOTE — Progress Notes (Signed)
   11/16/23 1415  Vitals  Temp 98.1 F (36.7 C)  Pulse Rate 85  Resp 19  BP (!) 147/67  SpO2 98 %  Post Treatment  Dialyzer Clearance Lightly streaked  Hemodialysis Intake (mL) 0 mL  Liters Processed 68.6  Fluid Removed (mL) 3000 mL  Tolerated HD Treatment Yes  Post-Hemodialysis Comments tx completed tolerated well goal met  AVG/AVF Arterial Site Held (minutes) 5 minutes  AVG/AVF Venous Site Held (minutes) 5 minutes  During Treatment Monitoring  Duration of HD Treatment -hour(s) 3.25 hour(s)   Received patient in bed to unit.  Alert and oriented.  Informed consent signed and in chart.   TX duration:  Patient tolerated well.  Transported back to the room  Alert, without acute distress.  Hand-off given to patient's nurse.   Access used: LUA graft Access issues: none  Total UF removed: 3000 Medication(s) given: none Post HD VS: see above Post HD weight: n/a   [facility]

## 2023-11-16 NOTE — ED Triage Notes (Signed)
Pt here for dialysis. Completed full treatment on Tuesday. Continues to have neck pain

## 2023-11-16 NOTE — ED Notes (Signed)
Called report to hemodialysis. Pt ready for transport.

## 2023-11-17 NOTE — Procedures (Signed)
I have reviewed the HD regimen and made appropriate changes.  Vinson Moselle MD  [facility] 11/17/2023, 7:41 AM

## 2023-11-23 ENCOUNTER — Other Ambulatory Visit: Payer: Self-pay

## 2023-11-23 ENCOUNTER — Emergency Department (HOSPITAL_COMMUNITY)
Admission: EM | Admit: 2023-11-23 | Discharge: 2023-11-24 | Disposition: A | Discharge status: Home / Self Care | Payer: 59 | Attending: Emergency Medicine | Admitting: Emergency Medicine

## 2023-11-23 ENCOUNTER — Emergency Department (HOSPITAL_COMMUNITY): Payer: 59

## 2023-11-23 DIAGNOSIS — Z89612 Acquired absence of left leg above knee: Secondary | ICD-10-CM | POA: Diagnosis not present

## 2023-11-23 DIAGNOSIS — Z91158 Patient's noncompliance with renal dialysis for other reason: Secondary | ICD-10-CM | POA: Insufficient documentation

## 2023-11-23 DIAGNOSIS — R600 Localized edema: Secondary | ICD-10-CM | POA: Diagnosis not present

## 2023-11-23 DIAGNOSIS — Z794 Long term (current) use of insulin: Secondary | ICD-10-CM | POA: Insufficient documentation

## 2023-11-23 DIAGNOSIS — R0602 Shortness of breath: Secondary | ICD-10-CM | POA: Insufficient documentation

## 2023-11-23 DIAGNOSIS — N186 End stage renal disease: Secondary | ICD-10-CM | POA: Diagnosis present

## 2023-11-23 DIAGNOSIS — R0789 Other chest pain: Secondary | ICD-10-CM | POA: Diagnosis not present

## 2023-11-23 DIAGNOSIS — M7989 Other specified soft tissue disorders: Secondary | ICD-10-CM | POA: Insufficient documentation

## 2023-11-23 DIAGNOSIS — R059 Cough, unspecified: Secondary | ICD-10-CM | POA: Diagnosis not present

## 2023-11-23 DIAGNOSIS — D631 Anemia in chronic kidney disease: Secondary | ICD-10-CM | POA: Diagnosis not present

## 2023-11-23 DIAGNOSIS — Z992 Dependence on renal dialysis: Secondary | ICD-10-CM | POA: Insufficient documentation

## 2023-11-23 DIAGNOSIS — Z6841 Body Mass Index (BMI) 40.0 and over, adult: Secondary | ICD-10-CM | POA: Diagnosis not present

## 2023-11-23 DIAGNOSIS — Z7982 Long term (current) use of aspirin: Secondary | ICD-10-CM | POA: Insufficient documentation

## 2023-11-23 LAB — TROPONIN I (HIGH SENSITIVITY)
Troponin I (High Sensitivity): 19 ng/L — ABNORMAL HIGH (ref <18)
Troponin I (High Sensitivity): 22 ng/L — ABNORMAL HIGH (ref <18)

## 2023-11-23 LAB — CBC WITH DIFFERENTIAL/PLATELET
Abs Immature Granulocytes: 0.09 10*3/uL — ABNORMAL HIGH (ref 0.00–0.07)
Basophils Absolute: 0 10*3/uL (ref 0.0–0.1)
Basophils Relative: 0 %
Eosinophils Absolute: 0.1 10*3/uL (ref 0.0–0.5)
Eosinophils Relative: 1 %
HCT: 31.3 % — ABNORMAL LOW (ref 39.0–52.0)
Hemoglobin: 9.8 g/dL — ABNORMAL LOW (ref 13.0–17.0)
Immature Granulocytes: 1 %
Lymphocytes Relative: 6 %
Lymphs Abs: 0.5 10*3/uL — ABNORMAL LOW (ref 0.7–4.0)
MCH: 28.8 pg (ref 26.0–34.0)
MCHC: 31.3 g/dL (ref 30.0–36.0)
MCV: 92.1 fL (ref 80.0–100.0)
Monocytes Absolute: 0.5 10*3/uL (ref 0.1–1.0)
Monocytes Relative: 7 %
Neutro Abs: 6.4 10*3/uL (ref 1.7–7.7)
Neutrophils Relative %: 85 %
Platelets: 111 10*3/uL — ABNORMAL LOW (ref 150–400)
RBC: 3.4 MIL/uL — ABNORMAL LOW (ref 4.22–5.81)
RDW: 14.8 % (ref 11.5–15.5)
WBC: 7.5 10*3/uL (ref 4.0–10.5)
nRBC: 0 % (ref 0.0–0.2)

## 2023-11-23 LAB — COMPREHENSIVE METABOLIC PANEL
ALT: 20 U/L (ref 0–44)
AST: 10 U/L — ABNORMAL LOW (ref 15–41)
Albumin: 3.4 g/dL — ABNORMAL LOW (ref 3.5–5.0)
Alkaline Phosphatase: 97 U/L (ref 38–126)
Anion gap: 14 (ref 5–15)
BUN: 101 mg/dL — ABNORMAL HIGH (ref 8–23)
CO2: 24 mmol/L (ref 22–32)
Calcium: 8.2 mg/dL — ABNORMAL LOW (ref 8.9–10.3)
Chloride: 97 mmol/L — ABNORMAL LOW (ref 98–111)
Creatinine, Ser: 5.81 mg/dL — ABNORMAL HIGH (ref 0.61–1.24)
GFR, Estimated: 10 mL/min — ABNORMAL LOW (ref >60)
Glucose, Bld: 356 mg/dL — ABNORMAL HIGH (ref 70–99)
Potassium: 4 mmol/L (ref 3.5–5.1)
Sodium: 135 mmol/L (ref 135–145)
Total Bilirubin: 0.5 mg/dL (ref <1.2)
Total Protein: 6.6 g/dL (ref 6.5–8.1)

## 2023-11-23 LAB — PHOSPHORUS: Phosphorus: 4.3 mg/dL (ref 2.5–4.6)

## 2023-11-23 MED ORDER — NEPRO/CARBSTEADY PO LIQD
237.0000 mL | ORAL | Status: DC | PRN
  Filled 2023-11-23: qty 237

## 2023-11-23 MED ORDER — ALTEPLASE 2 MG IJ SOLR: 2.0000 mg | Freq: Once | INTRAMUSCULAR | Status: DC | PRN

## 2023-11-23 MED ORDER — LIDOCAINE HCL (PF) 1 % IJ SOLN: 5.0000 mL | INTRAMUSCULAR | Status: DC | PRN

## 2023-11-23 MED ORDER — PENTAFLUOROPROP-TETRAFLUOROETH EX AERO: 1.0000 | INHALATION_SPRAY | CUTANEOUS | Status: DC | PRN

## 2023-11-23 MED ORDER — DARBEPOETIN ALFA 25 MCG/0.42ML IJ SOSY
25.0000 ug | PREFILLED_SYRINGE | Freq: Once | INTRAMUSCULAR | Status: AC
  Administered 2023-11-23: 25 ug via SUBCUTANEOUS
  Filled 2023-11-23: qty 0.42

## 2023-11-23 MED ORDER — HEPARIN SODIUM (PORCINE) 1000 UNIT/ML DIALYSIS: 1000.0000 [IU] | INTRAMUSCULAR | Status: DC | PRN

## 2023-11-23 MED ORDER — LIDOCAINE-PRILOCAINE 2.5-2.5 % EX CREA: 1.0000 | TOPICAL_CREAM | CUTANEOUS | Status: DC | PRN

## 2023-11-23 MED ORDER — HEPARIN SODIUM (PORCINE) 1000 UNIT/ML DIALYSIS: 2000.0000 [IU] | Freq: Once | INTRAMUSCULAR | Status: DC

## 2023-11-23 MED ORDER — ANTICOAGULANT SODIUM CITRATE 4% (200MG/5ML) IV SOLN
5.0000 mL | Status: DC | PRN
  Filled 2023-11-23: qty 5

## 2023-11-23 NOTE — ED Provider Notes (Incomplete)
[place] EMERGENCY DEPARTMENT AT [facility] Provider Note   CSN: [id number] Arrival date & time: 11/23/23  1001     History {Add pertinent medical, surgical, social history, OB history to HPI:1} Chief Complaint  Patient presents with   Shortness of Breath    Jeffrey [name] is a 62 y.o. male.  HPI Patient reports last dialysis was 1 week ago.  He reports he is getting swelling in his legs and some increased shortness of breath.  He reports he is having a mildly productive cough with some clear to yellowish mucus.  Patient reports he has some mild chest tightness but believes that it is musculoskeletal in nature.  Reports he has not been unable to get to his scheduled dialysis.  Presenting to the emergency department for dialysis.    Home Medications Prior to Admission medications   Medication Sig Start Date End Date Taking? Authorizing Provider  acetaminophen (TYLENOL) 325 MG tablet Take 2 tablets (650 mg total) by mouth every 6 (six) hours. 07/23/23   Lockie Mola, MD  amLODipine (NORVASC) 10 MG tablet Take 10 mg by mouth every morning. 04/17/20   [provider]  ASPIRIN LOW DOSE 81 MG EC tablet Take 81 mg by mouth in the morning. 02/22/21   [provider]  atorvastatin (LIPITOR) 40 MG tablet Take 40 mg by mouth in the morning. 02/09/21   [provider]  calcium acetate (PHOSLO) 667 MG capsule Take 2 capsules (1,334 mg total) by mouth with breakfast, with lunch, and with evening meal. 04/10/21   Calvert Cantor, MD  calcium acetate (PHOSLO) 667 MG capsule Take 2 capsules (1,334 mg total) by mouth 3 (three) times daily with meals. 11/04/23 12/04/23  Tegeler, Canary Brim, MD  carvedilol (COREG) 12.5 MG tablet Take 12.5 mg by mouth 2 (two) times daily with a meal. 04/17/20   [provider]  clopidogrel (PLAVIX) 75 MG tablet Take 1 tablet (75 mg total) by mouth daily. 11/02/22   Rhyne, Ames Coupe, PA-C  diazepam (VALIUM) 5 MG tablet Take 5 mg  by mouth 2 (two) times daily as needed for muscle spasms. 12/09/21   [provider]  Fluticasone-Umeclidin-Vilant (TRELEGY ELLIPTA IN) Inhale 1 puff into the lungs daily.    [provider]  insulin lispro (HUMALOG KWIKPEN) 100 UNIT/ML KwikPen Inject 0-9 Units into the skin 3 (three) times daily with meals. CBG < 70: treat low blood sugar CBG 70 - 120: 0 units  CBG 121 - 150: 1 unit  CBG 151 - 200: 2 units  CBG 201 - 250: 3 units  CBG 251 - 300: 5 units  CBG 301 - 350: 7 units  CBG 351 - 400: 9 units  CBG > 400: call MD 09/27/23   Zigmund Daniel., MD  insulin regular human CONCENTRATED (HUMULIN R U-500 KWIKPEN) 500 UNIT/ML KwikPen Inject 55 Units into the skin 2 (two) times daily with a meal. Follow up with your PCP and [facility] for additional adjustments 09/27/23 10/27/23  Zigmund Daniel., MD  iron polysaccharides (NIFEREX) 150 MG capsule Take 1 capsule (150 mg total) by mouth daily. 09/26/23   Meredeth Ide, MD  nortriptyline (PAMELOR) 10 MG capsule Take 20 mg by mouth at bedtime.    [provider]  pantoprazole (PROTONIX) 40 MG tablet Take 40 mg by mouth daily before breakfast. 02/09/21   [provider]  pregabalin (LYRICA) 75 MG capsule Take 1 capsule (75 mg total) by mouth  See admin instructions. Daily.  Give after dialysis on dialysis days. Patient taking differently: Take 75 mg by mouth daily. 01/21/23   [name], MD  sertraline (ZOLOFT) 100 MG tablet Take 100 mg by mouth in the morning. 02/22/21   [provider]  tamsulosin (FLOMAX) 0.4 MG CAPS capsule Take 1 capsule (0.4 mg total) by mouth daily. 11/10/21   [name], MD  torsemide (DEMADEX) 20 MG tablet Take 120 mg by mouth 2 (two) times daily.    [provider]  traZODone (DESYREL) 150 MG tablet Take 150 mg by mouth at bedtime. 12/09/21   [provider]  Vitamin D, Ergocalciferol, (DRISDOL) 1.25 MG (50000 UNIT) CAPS capsule Take 1 capsule (50,000  Units total) by mouth every 7 (seven) days. 10/02/23   Meredeth Ide, MD      Allergies    Actos [pioglitazone], Dexmedetomidine, Ibuprofen, Tomato, and Wellbutrin [bupropion]    Review of Systems   Review of Systems  Physical Exam Updated Vital Signs BP (!) 149/84   Pulse 99   Temp 98.9 F (37.2 C) (Oral)   Resp (!) 8   Ht 5\' 5"  (1.651 m)   Wt 136.1 kg   SpO2 100%   BMI 49.92 kg/m  Physical Exam Constitutional:      Comments: Alert nontoxic morbid obesity.  Mild increased work of breathing at rest.  HENT:     Mouth/Throat:     Pharynx: Oropharynx is clear.  Cardiovascular:     Rate and Rhythm: Normal rate and regular rhythm.  Pulmonary:     Comments: Mild increased work of breathing at rest.  Patient has very thick chest wall.  Breath sounds are very distant diffusely. Abdominal:     Comments: Firm abdomen with central obesity nontender  Musculoskeletal:     Comments: Right lower extremity has 3+ edema and chronic appearing edema.  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  Skin:    General: Skin is warm and dry.  Neurological:     General: No focal deficit present.     Mental Status: He is oriented to person, place, and time.     Coordination: Coordination normal.  Psychiatric:        Mood and Affect: Mood normal.     ED Results / Procedures / Treatments   Labs (all labs ordered are listed, but only abnormal results are displayed) Labs Reviewed  CBC WITH DIFFERENTIAL/PLATELET  COMPREHENSIVE METABOLIC PANEL  TROPONIN I (HIGH SENSITIVITY)    EKG EKG Interpretation Date/Time:  Thursday November 23 2023 10:15:49 EST Ventricular Rate:  96 PR Interval:  157 QRS Duration:  92 QT Interval:  364 QTC Calculation: 460 R Axis:   86  Text Interpretation: Sinus rhythm Borderline right axis deviation ST elevation, consider inferior injury rate increase from previoius with some artifact, otherwise similar to previous Confirmed by Arby Barrette [phone]) on 11/23/2023 10:54:05  AM  Radiology No results found.  Procedures Procedures  {Document cardiac monitor, telemetry assessment procedure when appropriate:1}  Medications Ordered in ED Medications - No data to display  ED Course/ Medical Decision Making/ A&P   {   Click here for ABCD2, HEART and other calculatorsREFRESH Note before signing :1}                              Medical Decision Making  ***  {Document critical care time when appropriate:1} {Document review of labs and clinical decision tools ie heart  score, Chads2Vasc2 etc:1}  {Document your independent review of radiology images, and any outside records:1} {Document your discussion with family members, caretakers, and with consultants:1} {Document social determinants of health affecting pt's care:1} {Document your decision making why or why not admission, treatments were needed:1} Final Clinical Impression(s) / ED Diagnoses Final diagnoses:  None    Rx / DC Orders ED Discharge Orders     None

## 2023-11-23 NOTE — ED Notes (Signed)
2nd trop due @ 1530 per RN

## 2023-11-23 NOTE — ED Triage Notes (Signed)
Per EMS and pt, pt's mobility scooter is broken and he wasn't able to get to dialysis. Pt states he is a little short of breath and feels like he is "full of fluid". Pt states he needs dialysis.

## 2023-11-23 NOTE — ED Provider Notes (Signed)
  Physical Exam  BP (!) 159/89 (BP Location: Right Arm)   Pulse 88   Temp 98.4 F (36.9 C) (Oral)   Resp 15   Ht 5\' 5"  (1.651 m)   Wt 136.1 kg   SpO2 99%   BMI 49.92 kg/m   Physical Exam  Procedures  Procedures  ED Course / MDM    Medical Decision Making Amount and/or Complexity of Data Reviewed Labs: ordered. Radiology: ordered.  Risk Decision regarding hospitalization.   Jeffrey [name], assumed care for this patient.  62 year old male who needs dialysis.  Patient was able to go to the dialysis suite at approximately 8 PM on 12/26.  He will be appropriate for discharge when he returns from dialysis       Arletha Pili, DO 11/23/23 2004

## 2023-11-24 NOTE — ED Provider Notes (Signed)
Provider Note MRN:  [id number]  Arrival date & time: 11/24/23    ED Course and Medical Decision Making  Assumed care from Dr  Andria Meuse at shift change.  See note from prior team for complete details, in brief:  Pt well known to this facility Was here for HD Completed HD and returned from HD In stable condition Feeling better Stable for dc Encouraged o/p f/u Given o/p sw resources Daughter to come pick up  The patient improved significantly and was discharged in stable condition. Detailed discussions were had with the patient regarding current findings, and need for close f/u with PCP or on call doctor. The patient has been instructed to return immediately if the symptoms worsen in any way for re-evaluation. Patient verbalized understanding and is in agreement with current care plan. All questions answered prior to discharge.       Procedures  Final Clinical Impressions(s) / ED Diagnoses     ICD-10-CM   1. ESRD on hemodialysis (HCC)  N18.6    Z99.2       ED Discharge Orders     None         Discharge Instructions      RESOURCE GUIDE  Chronic Pain Problems: Contact [facility]  [phone] Patients need to be referred by their primary care doctor.  Insufficient Money for Medicine: Contact United Way:  call [phone]  No Primary Care Doctor: Call Health Connect  [phone] - can help you locate a primary care doctor that  accepts your insurance, provides certain services, etc. Physician Referral Service- [phone]  Agencies that provide inexpensive medical care: [facility]  [phone] [facility]  [phone] [facility]  [phone] [facility]  [phone] [facility]  [phone] [facility]  [phone]  Medicaid-accepting [facility] Providers: [facility]- [address]  [phone], Mon-Fri 9am-7pm, Sat 9am-1pm [facility]-  [address] [place], [address] [place]  [phone] [facility]- [address] [place]  [phone] [facility]- [address]  [phone] Renaye Rakers- [address] [place], [address], [phone]  Only accepts [place] patients after they have their name  applied to their card  Self Pay (no insurance) in [facility]: Sickle Cell Patients - [facility]  [address] [place], [phone] [facility]- [address] [place]  [phone]       [facility] [place]- [address]       -     [facility]- see information above (Speak to [employer] if you do not have insurance)       -  [facility]- [address],  [phone]       -  [facility]- [address] [phone]       -  Dr Julio Sicks-  [address], [place], [phone]       -  [facility] - [address] [phone]       -  [facility]- [address], [phone], also [address], [phone]       -     [facility]- [address] [place], [phone], 1st & 3rd Saturday         every month,  10am-1pm  -     [facility]   [address] [place], [place].   Phone:  [phone], Fax:  [phone]. Hours of Operation:  9 am - 6 pm, M-F.  -     [facility]   [address], [place]   Phone: [phone], Fax: [phone]. Hours of Operation:  8:30 am - 5:30 pm, M-F.    Dental Assistance If unable to pay or uninsured, contact:  [facility]. to become qualified for the adult dental clinic.  Patients with Medicaid: [facility] [phone] [address], [phone] [address] [phone]  If unable to pay, or uninsured, contact [facility] [phone] in [place], [phone] in [facility]) to become qualified  for the adult dental clinic  [facility] [address] [place], [place] [address] [phone] [email or web address]  Other Proofreader Services: [facility]- [address] [place], [place], [place], [address], [phone], Ext. [phone], 2nd and 4th Thursday of the month at 6:30am.  10 clients each day by appointment, can sometimes see walk-in patients if someone does not show for an appointment. [facility]- [address] [place], [place], [address], [phone] [facility] [address], [place], [place], [address], [phone] [facility] Health Department- [phone] [facility] Health Department- [phone] [facility] [phone]       It was a pleasure caring for you today in the emergency department.  Please return to the emergency department for any worsening or worrisome symptoms.          Sloan Leiter, DO 11/24/23 [phone]

## 2023-11-24 NOTE — Discharge Instructions (Addendum)
RESOURCE GUIDE  Chronic Pain Problems: Contact [facility]  [phone] Patients need to be referred by their primary care doctor.  Insufficient Money for Medicine: Contact United Way:  call [phone]  No Primary Care Doctor: Call [facility] Connect  [phone] - can help you locate a primary care doctor that  accepts your insurance, provides certain services, etc. Physician Referral Service- [phone]  Agencies that provide inexpensive medical care: [facility]  [phone] [facility]  [phone] [facility]  [phone] [facility]  [phone] [facility]  [phone] [facility]  [phone]  Medicaid-accepting [facility] Providers: [facility]- [address]  [phone], Mon-Fri 9am-7pm, Sat 9am-1pm [facility]- [address] [place], [address] [place]  [phone] [facility]- [address], [address] [place]  [phone] [facility]- [address]  [phone] Ren[facility] Rakers- [address] [place], [address], [phone]  Only accepts [place] patients after they have their name  applied to their card  Self Pay (no insurance) in [facility]: Sickle Cell Patients - [facility]  [address] [place], [phone] [facility]- [address] [place]  [phone]       [facility] [place]- [address]       -     [facility]- see information above (Speak to [employer] if you do not have insurance)       -  [facility]- [address],  [phone]       -  [facility]- [address] [phone]       -  Dr Julio Sicks-  [address], [place], [phone]       -  [facility] and [facility] - [address] [phone]       -  [facility]- [address] [phone], also [address]  [phone]       -     [facility]- [address] [place], [phone], 1st & 3rd Saturday         every month, 10am-1pm  -     [facility]   [address] [place], [place].   Phone:  [phone], Fax:  [phone]. Hours of Operation:  9 am - 6 pm, M-F.  -     [facility]   [address], [place]   Phone: [phone], Fax: [phone]. Hours of Operation:  8:30 am - 5:30 pm, M-F.    Dental Assistance If unable to pay or uninsured, contact:  [facility]. to become qualified for the adult dental clinic.  Patients with Medicaid: [facility] [phone], [phone] [address] [phone]  If unable to pay, or uninsured, contact [facility] [phone] in [place], [phone] in [facility]) to become qualified for the adult dental clinic  [facility] [address] [place], [place] [address] [phone] [email or web address]  Other Proofreader Services: [facility]- [address] [place], [place], [place], [address], [phone], Ext. [phone], 2nd and 4th Thursday of the month at 6:30am.  10 clients each day by appointment, can sometimes see walk-in patients if someone does not show for an appointment.  [facility]- [address] [place], [place], [address], [phone] [facility] [address] [place], [place], [address], [phone] [facility] Health Department- [phone] [facility] Health Department- [phone] [facility] [phone]      It was a pleasure caring for you today in the emergency department.  Please return to the emergency department for any worsening or worrisome symptoms.

## 2023-11-24 NOTE — ED Notes (Signed)
Patient is waiting on his daughter to come get him , states his motorized wheelchair is broken. Was her for dialysis yest.

## 2023-11-28 ENCOUNTER — Encounter (HOSPITAL_COMMUNITY): Payer: Self-pay

## 2023-11-28 ENCOUNTER — Other Ambulatory Visit: Payer: Self-pay

## 2023-11-28 ENCOUNTER — Emergency Department (HOSPITAL_COMMUNITY)
Admission: EM | Admit: 2023-11-28 | Discharge: 2023-11-29 | Discharge status: Against Medical Advice | Payer: 59 | Attending: Emergency Medicine | Admitting: Emergency Medicine

## 2023-11-28 DIAGNOSIS — Z992 Dependence on renal dialysis: Secondary | ICD-10-CM | POA: Insufficient documentation

## 2023-11-28 DIAGNOSIS — Z794 Long term (current) use of insulin: Secondary | ICD-10-CM | POA: Insufficient documentation

## 2023-11-28 DIAGNOSIS — Z7982 Long term (current) use of aspirin: Secondary | ICD-10-CM | POA: Diagnosis not present

## 2023-11-28 DIAGNOSIS — N186 End stage renal disease: Secondary | ICD-10-CM | POA: Diagnosis present

## 2023-11-28 LAB — RENAL FUNCTION PANEL
Albumin: 3.6 g/dL (ref 3.5–5.0)
Anion gap: 13 (ref 5–15)
BUN: 84 mg/dL — ABNORMAL HIGH (ref 8–23)
CO2: 26 mmol/L (ref 22–32)
Calcium: 8.6 mg/dL — ABNORMAL LOW (ref 8.9–10.3)
Chloride: 97 mmol/L — ABNORMAL LOW (ref 98–111)
Creatinine, Ser: 5.11 mg/dL — ABNORMAL HIGH (ref 0.61–1.24)
GFR, Estimated: 12 mL/min — ABNORMAL LOW (ref >60)
Glucose, Bld: 111 mg/dL — ABNORMAL HIGH (ref 70–99)
Phosphorus: 5.5 mg/dL — ABNORMAL HIGH (ref 2.5–4.6)
Potassium: 4.1 mmol/L (ref 3.5–5.1)
Sodium: 136 mmol/L (ref 135–145)

## 2023-11-28 LAB — HEPATITIS B SURFACE ANTIGEN: Hepatitis B Surface Ag: NONREACTIVE

## 2023-11-28 MED ORDER — HEPARIN SODIUM (PORCINE) 1000 UNIT/ML IJ SOLN
INTRAMUSCULAR | Status: AC
  Filled 2023-11-28: qty 2

## 2023-11-28 MED ORDER — CHLORHEXIDINE GLUCONATE CLOTH 2 % EX PADS: 6.0000 | MEDICATED_PAD | Freq: Every day | CUTANEOUS | Status: DC

## 2023-11-28 MED ORDER — HEPARIN SODIUM (PORCINE) 1000 UNIT/ML DIALYSIS
2000.0000 [IU] | Freq: Once | INTRAMUSCULAR | Status: AC
  Administered 2023-11-28: 2000 [IU] via INTRAVENOUS_CENTRAL

## 2023-11-28 MED ORDER — LIDOCAINE-PRILOCAINE 2.5-2.5 % EX CREA
1.0000 | TOPICAL_CREAM | CUTANEOUS | Status: DC | PRN
Start: 2023-11-28 — End: 2023-11-28

## 2023-11-28 MED ORDER — ANTICOAGULANT SODIUM CITRATE 4% (200MG/5ML) IV SOLN: 5.0000 mL | Status: DC | PRN

## 2023-11-28 MED ORDER — LIDOCAINE HCL (PF) 1 % IJ SOLN: 5.0000 mL | INTRAMUSCULAR | Status: DC | PRN

## 2023-11-28 MED ORDER — HEPARIN SODIUM (PORCINE) 1000 UNIT/ML DIALYSIS: 1000.0000 [IU] | INTRAMUSCULAR | Status: DC | PRN

## 2023-11-28 MED ORDER — ALTEPLASE 2 MG IJ SOLR: 2.0000 mg | Freq: Once | INTRAMUSCULAR | Status: DC | PRN

## 2023-11-28 MED ORDER — PENTAFLUOROPROP-TETRAFLUOROETH EX AERO: 1.0000 | INHALATION_SPRAY | CUTANEOUS | Status: DC | PRN

## 2023-11-28 NOTE — ED Notes (Signed)
Pt declined having blood drawn in triage, requested to have it drawn in dialysis.

## 2023-11-28 NOTE — ED Notes (Addendum)
Pt awaiting dialysis.  Baseline 3L oxygen nasal cannula.  No acute distress noted at this time.

## 2023-11-28 NOTE — ED Triage Notes (Signed)
 Pt presents for dialysis per Jeffrey [name], Sat schedule. Pt denies other complaints.

## 2023-11-28 NOTE — ED Provider Notes (Signed)
 [place] EMERGENCY DEPARTMENT AT [facility] Provider Note   CSN: [id number] Arrival date & time: 11/28/23  9270     History  Chief Complaint  Patient presents with   needs dialysis    CALTON HARSHFIELD is a 62 y.o. male.  HPI 62 year old male end-stage renal disease on dialysis who has been being dialyzed in the ED for a year now.  Patient was last dialysis on Thursday.  He is not having any complaints other than needing dialysis today.  Specifically, he denies chest pain, shortness of breath, or definite changes in weight although he states we never know my weight.     Home Medications Prior to Admission medications   Medication Sig Start Date End Date Taking? Authorizing Provider  acetaminophen  (TYLENOL ) 325 MG tablet Take 2 tablets (650 mg total) by mouth every 6 (six) hours. 07/23/23   Nicholas Bar, MD  amLODipine  (NORVASC ) 10 MG tablet Take 10 mg by mouth every morning. 04/17/20   [provider]  ASPIRIN  LOW DOSE 81 MG EC tablet Take 81 mg by mouth in the morning. 02/22/21   [provider]  atorvastatin  (LIPITOR) 40 MG tablet Take 40 mg by mouth in the morning. 02/09/21   [provider]  calcium  acetate (PHOSLO ) 667 MG capsule Take 2 capsules (1,334 mg total) by mouth with breakfast, with lunch, and with evening meal. 04/10/21   Earley Saucer, MD  calcium  acetate (PHOSLO ) 667 MG capsule Take 2 capsules (1,334 mg total) by mouth 3 (three) times daily with meals. 11/04/23 12/04/23  Tegeler, Lonni PARAS, MD  carvedilol  (COREG ) 12.5 MG tablet Take 12.5 mg by mouth 2 (two) times daily with a meal. 04/17/20   [provider]  clopidogrel  (PLAVIX ) 75 MG tablet Take 1 tablet (75 mg total) by mouth daily. 11/02/22   Rhyne, Samantha J, PA-C  diazepam  (VALIUM ) 5 MG tablet Take 5 mg by mouth 2 (two) times daily as needed for muscle spasms. 12/09/21   [provider]  Fluticasone -Umeclidin-Vilant (TRELEGY ELLIPTA IN) Inhale 1 puff into  the lungs daily.    [provider]  insulin  lispro (HUMALOG  KWIKPEN) 100 UNIT/ML KwikPen Inject 0-9 Units into the skin 3 (three) times daily with meals. CBG < 70: treat low blood sugar CBG 70 - 120: 0 units  CBG 121 - 150: 1 unit  CBG 151 - 200: 2 units  CBG 201 - 250: 3 units  CBG 251 - 300: 5 units  CBG 301 - 350: 7 units  CBG 351 - 400: 9 units  CBG > 400: call MD 09/27/23   Perri DELENA Meliton Mickey., MD  insulin  regular human CONCENTRATED (HUMULIN  R U-500 KWIKPEN) 500 UNIT/ML KwikPen Inject 55 Units into the skin 2 (two) times daily with a meal. Follow up with your PCP and [facility] for additional adjustments 09/27/23 10/27/23  Perri DELENA Meliton Mickey., MD  iron  polysaccharides (NIFEREX) 150 MG capsule Take 1 capsule (150 mg total) by mouth daily. 09/26/23   Drusilla Sabas RAMAN, MD  nortriptyline  (PAMELOR ) 10 MG capsule Take 20 mg by mouth at bedtime.    [provider]  pantoprazole  (PROTONIX ) 40 MG tablet Take 40 mg by mouth daily before breakfast. 02/09/21   [provider]  pregabalin  (LYRICA ) 75 MG capsule Take 1 capsule (75 mg total) by mouth See admin instructions. Daily.  Give after dialysis on dialysis days. Patient taking differently: Take 75 mg by mouth daily. 01/21/23   Randol Simmonds, MD  sertraline  (ZOLOFT ) 100 MG tablet Take 100 mg by mouth in the morning. 02/22/21   [provider]  tamsulosin  (FLOMAX ) 0.4 MG CAPS capsule Take 1 capsule (0.4 mg total) by mouth daily. 11/10/21   Vernon Ranks, MD  torsemide  (DEMADEX ) 20 MG tablet Take 120 mg by mouth 2 (two) times daily.    [provider]  traZODone  (DESYREL ) 150 MG tablet Take 150 mg by mouth at bedtime. 12/09/21   [provider]  Vitamin D , Ergocalciferol , (DRISDOL ) 1.25 MG (50000 UNIT) CAPS capsule Take 1 capsule (50,000 Units total) by mouth every 7 (seven) days. 10/02/23   Drusilla Sabas RAMAN, MD      Allergies    Actos [pioglitazone], Dexmedetomidine , Ibuprofen, Tomato, and  Wellbutrin [bupropion]    Review of Systems   Review of Systems  Physical Exam Updated Vital Signs BP (!) 160/93 (BP Location: Right Wrist)   Pulse 75   Temp 97.6 F (36.4 C) (Oral)   Resp 13   Ht 1.651 m (5' 5)   Wt 136 kg   SpO2 99%   BMI 49.89 kg/m  Physical Exam Vitals reviewed.  HENT:     Head: Normocephalic.     Right Ear: External ear normal.     Left Ear: External ear normal.     Nose: Nose normal.     Mouth/Throat:     Pharynx: Oropharynx is clear.  Eyes:     Pupils: Pupils are equal, round, and reactive to light.  Cardiovascular:     Rate and Rhythm: Normal rate.  Pulmonary:     Effort: Pulmonary effort is normal.  Musculoskeletal:     Cervical back: Normal range of motion.     Comments: Left lower extremity status post amputation  Neurological:     General: No focal deficit present.     Mental Status: He is alert and oriented to person, place, and time.  Psychiatric:        Mood and Affect: Mood normal.     ED Results / Procedures / Treatments   Labs (all labs ordered are listed, but only abnormal results are displayed) Labs Reviewed  RENAL FUNCTION PANEL - Abnormal; Notable for the following components:      Result Value   Chloride 97 (*)    Glucose, Bld 111 (*)    BUN 84 (*)    Creatinine, Ser 5.11 (*)    Calcium  8.6 (*)    Phosphorus 5.5 (*)    GFR, Estimated 12 (*)    All other components within normal limits  HEPATITIS B SURFACE ANTIGEN  HEPATITIS B SURFACE ANTIBODY, QUANTITATIVE  I-STAT CHEM 8, ED    EKG None  Radiology No results found.  Procedures Procedures    Medications Ordered in ED Medications  Chlorhexidine  Gluconate Cloth 2 % PADS 6 each (0 each Topical Hold 11/28/23 1004)  pentafluoroprop-tetrafluoroeth (GEBAUERS) aerosol 1 Application (has no administration in time range)  lidocaine  (PF) (XYLOCAINE ) 1 % injection 5 mL (has no administration in time range)  lidocaine -prilocaine  (EMLA ) cream 1 Application (has no  administration in time range)  heparin  injection 1,000 Units (has no administration in time range)  anticoagulant sodium citrate  solution 5 mL (has no administration in time range)  alteplase  (CATHFLO ACTIVASE ) injection 2 mg (has no administration in time range)  heparin  sodium (porcine) 1000 UNIT/ML injection (has no administration in time range)  heparin  injection 2,000 Units (2,000 Units Dialysis Given 11/28/23 1200)    ED Course/ Medical Decision Making/  A&P                                 Medical Decision Making          Final Clinical Impression(s) / ED Diagnoses Final diagnoses:  ESRD (end stage renal disease) (HCC)  Encounter for dialysis [facility])    Rx / DC Orders ED Discharge Orders     None         Levander Houston, MD 11/28/23 1524

## 2023-11-29 LAB — HEPATITIS B SURFACE ANTIBODY, QUANTITATIVE: Hep B S AB Quant (Post): 38.9 m[IU]/mL

## 2023-12-05 ENCOUNTER — Emergency Department (HOSPITAL_COMMUNITY)
Admission: EM | Admit: 2023-12-05 | Discharge: 2023-12-05 | Disposition: A | Discharge status: Home / Self Care | Payer: 59 | Attending: Emergency Medicine | Admitting: Emergency Medicine

## 2023-12-05 ENCOUNTER — Other Ambulatory Visit: Payer: Self-pay

## 2023-12-05 DIAGNOSIS — Z7901 Long term (current) use of anticoagulants: Secondary | ICD-10-CM | POA: Diagnosis not present

## 2023-12-05 DIAGNOSIS — N186 End stage renal disease: Secondary | ICD-10-CM | POA: Insufficient documentation

## 2023-12-05 DIAGNOSIS — Z79899 Other long term (current) drug therapy: Secondary | ICD-10-CM | POA: Diagnosis not present

## 2023-12-05 DIAGNOSIS — R0602 Shortness of breath: Secondary | ICD-10-CM | POA: Insufficient documentation

## 2023-12-05 DIAGNOSIS — Z992 Dependence on renal dialysis: Secondary | ICD-10-CM | POA: Insufficient documentation

## 2023-12-05 LAB — RENAL FUNCTION PANEL
Albumin: 3.4 g/dL — ABNORMAL LOW (ref 3.5–5.0)
Anion gap: 13 (ref 5–15)
BUN: 89 mg/dL — ABNORMAL HIGH (ref 8–23)
CO2: 27 mmol/L (ref 22–32)
Calcium: 8.1 mg/dL — ABNORMAL LOW (ref 8.9–10.3)
Chloride: 95 mmol/L — ABNORMAL LOW (ref 98–111)
Creatinine, Ser: 5.54 mg/dL — ABNORMAL HIGH (ref 0.61–1.24)
GFR, Estimated: 11 mL/min — ABNORMAL LOW (ref >60)
Glucose, Bld: 500 mg/dL — ABNORMAL HIGH (ref 70–99)
Phosphorus: 4.8 mg/dL — ABNORMAL HIGH (ref 2.5–4.6)
Potassium: 4.2 mmol/L (ref 3.5–5.1)
Sodium: 135 mmol/L (ref 135–145)

## 2023-12-05 LAB — CBC
HCT: 30.7 % — ABNORMAL LOW (ref 39.0–52.0)
Hemoglobin: 9.5 g/dL — ABNORMAL LOW (ref 13.0–17.0)
MCH: 28.6 pg (ref 26.0–34.0)
MCHC: 30.9 g/dL (ref 30.0–36.0)
MCV: 92.5 fL (ref 80.0–100.0)
Platelets: 96 10*3/uL — ABNORMAL LOW (ref 150–400)
RBC: 3.32 MIL/uL — ABNORMAL LOW (ref 4.22–5.81)
RDW: 14.3 % (ref 11.5–15.5)
WBC: 5.5 10*3/uL (ref 4.0–10.5)
nRBC: 0 % (ref 0.0–0.2)

## 2023-12-05 MED ORDER — CHLORHEXIDINE GLUCONATE CLOTH 2 % EX PADS
6.0000 | MEDICATED_PAD | Freq: Every day | CUTANEOUS | Status: DC
Start: 2023-12-05 — End: 2023-12-06

## 2023-12-05 MED ORDER — HEPARIN SODIUM (PORCINE) 1000 UNIT/ML IJ SOLN
INTRAMUSCULAR | Status: AC
  Administered 2023-12-05: 2000 [IU]
  Filled 2023-12-05: qty 2

## 2023-12-05 NOTE — ED Notes (Signed)
 Pt became winded after going to restroom.  02 94% but patiently visibly working to breath.  Pt is going to dialysis.

## 2023-12-05 NOTE — Progress Notes (Signed)
   12/05/23 1630  Vitals  Temp 97.8 F (36.6 C)  Pulse Rate 84  Resp 15  BP (!) 187/74  SpO2 96 %  O2 Device Nasal Cannula  Weight  (unable to obtain - stretcher)  Oxygen Therapy  O2 Flow Rate (L/min) 3 L/min  Patient Activity (if Appropriate) In bed  Pulse Oximetry Type Continuous  Oximetry Probe Site Changed No  Post Treatment  Dialyzer Clearance Lightly streaked  Hemodialysis Intake (mL) 0 mL  Liters Processed 34  Fluid Removed (mL) 1.3 mL  Tolerated HD Treatment Yes  AVG/AVF Arterial Site Held (minutes) 5 minutes  AVG/AVF Venous Site Held (minutes) 5 minutes  During Treatment Monitoring  Duration of HD Treatment -hour(s) 1.5 hour(s)   Received patient in bed to unit.  Alert and oriented.  Informed consent signed and in chart.   TX duration:1hr pt ended tx early due to transportation  Patient tolerated well.  Transported back to the room  Alert, without acute distress.  Hand-off given to patient's nurse.   Access used: LAVG Access issues: none  Total UF removed: 1.3L Medication(s) given: none    [facility]

## 2023-12-05 NOTE — ED Triage Notes (Signed)
 Pt. Stated, I missed all last week and Im feeling poorly and over loaded

## 2023-12-05 NOTE — ED Provider Notes (Signed)
 Patient did not stay for reevaluation.  Somebody reported seeing him leave the emergency room   Gwyneth Sprout, MD 12/05/23 1801

## 2023-12-05 NOTE — Procedures (Signed)
 Asked to see for inpatient HD. The plan will be for ED HD. Pt is not to be admitted. Pt will go to the dialysis unit upstairs when they are ready for the patient. When dialysis is completed pt will be sent back to ED for reassessment.   I was present at the procedure, reviewed the HD regimen and made appropriate changes.   Myer Fret MD  [facility] 12/05/2023, 3:42 PM

## 2023-12-05 NOTE — ED Provider Notes (Signed)
 [facility] Provider Note   CSN: [id number] Arrival date & time: 12/05/23  0750     History  Chief Complaint  Patient presents with   Vascular Access Problem    Jeffrey [name] is a 63 y.o. male.  HPI Presents with dyspnea, fatigue.  Patient has end-stage renal disease, dialysis Tuesday Thursday Saturday, has not had dialysis in 1 week due to malfunctioning wheelchair.  No pain, complains of dyspnea, with increased work of breathing, but no new oxygen requirement.    Home Medications Prior to Admission medications   Medication Sig Start Date End Date Taking? Authorizing Provider  acetaminophen  (TYLENOL ) 325 MG tablet Take 2 tablets (650 mg total) by mouth every 6 (six) hours. 07/23/23   Nicholas Bar, MD  amLODipine  (NORVASC ) 10 MG tablet Take 10 mg by mouth every morning. 04/17/20   [provider]  ASPIRIN  LOW DOSE 81 MG EC tablet Take 81 mg by mouth in the morning. 02/22/21   [provider]  atorvastatin  (LIPITOR) 40 MG tablet Take 40 mg by mouth in the morning. 02/09/21   [provider]  calcium  acetate (PHOSLO ) 667 MG capsule Take 2 capsules (1,334 mg total) by mouth with breakfast, with lunch, and with evening meal. 04/10/21   Earley Saucer, MD  carvedilol  (COREG ) 12.5 MG tablet Take 12.5 mg by mouth 2 (two) times daily with a meal. 04/17/20   [provider]  clopidogrel  (PLAVIX ) 75 MG tablet Take 1 tablet (75 mg total) by mouth daily. 11/02/22   Rhyne, Samantha J, PA-C  diazepam  (VALIUM ) 5 MG tablet Take 5 mg by mouth 2 (two) times daily as needed for muscle spasms. 12/09/21   [provider]  Fluticasone -Umeclidin-Vilant (TRELEGY ELLIPTA IN) Inhale 1 puff into the lungs daily.    [provider]  insulin  lispro (HUMALOG  KWIKPEN) 100 UNIT/ML KwikPen Inject 0-9 Units into the skin 3 (three) times daily with meals. CBG < 70: treat low blood sugar CBG 70 - 120: 0 units  CBG 121 - 150: 1  unit  CBG 151 - 200: 2 units  CBG 201 - 250: 3 units  CBG 251 - 300: 5 units  CBG 301 - 350: 7 units  CBG 351 - 400: 9 units  CBG > 400: call MD 09/27/23   Perri DELENA Meliton Mickey., MD  insulin  regular human CONCENTRATED (HUMULIN  R U-500 KWIKPEN) 500 UNIT/ML KwikPen Inject 55 Units into the skin 2 (two) times daily with a meal. Follow up with your PCP and [facility] for additional adjustments 09/27/23 10/27/23  Perri DELENA Meliton Mickey., MD  iron  polysaccharides (NIFEREX) 150 MG capsule Take 1 capsule (150 mg total) by mouth daily. 09/26/23   Drusilla Sabas RAMAN, MD  nortriptyline  (PAMELOR ) 10 MG capsule Take 20 mg by mouth at bedtime.    [provider]  pantoprazole  (PROTONIX ) 40 MG tablet Take 40 mg by mouth daily before breakfast. 02/09/21   [provider]  pregabalin  (LYRICA ) 75 MG capsule Take 1 capsule (75 mg total) by mouth See admin instructions. Daily.  Give after dialysis on dialysis days. Patient taking differently: Take 75 mg by mouth daily. 01/21/23   Randol Simmonds, MD  sertraline  (ZOLOFT ) 100 MG tablet Take 100 mg by mouth in the morning. 02/22/21   [provider]  tamsulosin  (FLOMAX ) 0.4 MG CAPS capsule Take 1 capsule (0.4 mg total) by mouth daily. 11/10/21   Vernon Ranks, MD  torsemide  (DEMADEX ) 20 MG tablet  Take 120 mg by mouth 2 (two) times daily.    [provider]  traZODone  (DESYREL ) 150 MG tablet Take 150 mg by mouth at bedtime. 12/09/21   [provider]  Vitamin D , Ergocalciferol , (DRISDOL ) 1.25 MG (50000 UNIT) CAPS capsule Take 1 capsule (50,000 Units total) by mouth every 7 (seven) days. 10/02/23   Drusilla Sabas RAMAN, MD      Allergies    Actos [pioglitazone], Dexmedetomidine , Ibuprofen, Tomato, and Wellbutrin [bupropion]    Review of Systems   Review of Systems  Physical Exam Updated Vital Signs BP (!) 159/51 (BP Location: Right Arm)   Pulse 85   Temp 98.1 F (36.7 C)   Resp 18   SpO2 96%  Physical Exam Vitals and nursing note  reviewed.  Constitutional:      General: He is not in acute distress.    Appearance: He is well-developed.  HENT:     Head: Normocephalic and atraumatic.  Eyes:     Conjunctiva/sclera: Conjunctivae normal.  Cardiovascular:     Rate and Rhythm: Normal rate and regular rhythm.  Pulmonary:     Effort: Tachypnea present.  Abdominal:     General: There is no distension.  Musculoskeletal:     Comments: Prior lower extremity amputation  Skin:    General: Skin is warm and dry.  Neurological:     Mental Status: He is alert and oriented to person, place, and time.     ED Results / Procedures / Treatments   Labs (all labs ordered are listed, but only abnormal results are displayed) Labs Reviewed - No data to display  EKG None  Radiology No results found.  Procedures Procedures    Medications Ordered in ED Medications - No data to display  ED Course/ Medical Decision Making/ A&P                                 Medical Decision Making Presents with dyspnea the context of not being undergo dialysis for several sessions.  He is awake, alert, afebrile, no hypoxia on his baseline oxygen.  Given his increased work of breathing, need for dialysis discussed, and I consulted our nephrology colleagues to facilitate dialysis.  Patient's absence of fever, distress reassuring, low suspicion for concurrent infection.  Amount and/or Complexity of Data Reviewed External Data Reviewed: notes. Discussion of management or test interpretation with external provider(s): Need for dialysis with nephrology  Risk Prescription drug management. Decision regarding hospitalization. Diagnosis or treatment significantly limited by social determinants of health.    Final Clinical Impression(s) / ED Diagnoses Final diagnoses:  SOB (shortness of breath)    Rx / DC Orders ED Discharge Orders     None         Garrick Charleston, MD 12/05/23 [phone]

## 2023-12-07 ENCOUNTER — Encounter (HOSPITAL_COMMUNITY): Payer: Self-pay

## 2023-12-07 ENCOUNTER — Other Ambulatory Visit: Payer: Self-pay

## 2023-12-07 ENCOUNTER — Emergency Department (HOSPITAL_COMMUNITY)
Admission: EM | Admit: 2023-12-07 | Discharge: 2023-12-07 | Disposition: A | Discharge status: Home / Self Care | Payer: 59 | Attending: Student | Admitting: Student

## 2023-12-07 DIAGNOSIS — J449 Chronic obstructive pulmonary disease, unspecified: Secondary | ICD-10-CM | POA: Insufficient documentation

## 2023-12-07 DIAGNOSIS — N186 End stage renal disease: Secondary | ICD-10-CM | POA: Insufficient documentation

## 2023-12-07 DIAGNOSIS — Z992 Dependence on renal dialysis: Secondary | ICD-10-CM | POA: Diagnosis not present

## 2023-12-07 DIAGNOSIS — E1122 Type 2 diabetes mellitus with diabetic chronic kidney disease: Secondary | ICD-10-CM | POA: Insufficient documentation

## 2023-12-07 DIAGNOSIS — I509 Heart failure, unspecified: Secondary | ICD-10-CM | POA: Insufficient documentation

## 2023-12-07 DIAGNOSIS — I132 Hypertensive heart and chronic kidney disease with heart failure and with stage 5 chronic kidney disease, or end stage renal disease: Secondary | ICD-10-CM | POA: Diagnosis not present

## 2023-12-07 DIAGNOSIS — Z87891 Personal history of nicotine dependence: Secondary | ICD-10-CM | POA: Diagnosis not present

## 2023-12-07 DIAGNOSIS — Z79899 Other long term (current) drug therapy: Secondary | ICD-10-CM | POA: Diagnosis not present

## 2023-12-07 DIAGNOSIS — Z794 Long term (current) use of insulin: Secondary | ICD-10-CM | POA: Insufficient documentation

## 2023-12-07 DIAGNOSIS — Z7902 Long term (current) use of antithrombotics/antiplatelets: Secondary | ICD-10-CM | POA: Diagnosis not present

## 2023-12-07 DIAGNOSIS — Z4931 Encounter for adequacy testing for hemodialysis: Secondary | ICD-10-CM | POA: Diagnosis present

## 2023-12-07 DIAGNOSIS — Z7982 Long term (current) use of aspirin: Secondary | ICD-10-CM | POA: Insufficient documentation

## 2023-12-07 LAB — CBC WITH DIFFERENTIAL/PLATELET
Abs Immature Granulocytes: 0.06 10*3/uL (ref 0.00–0.07)
Basophils Absolute: 0 10*3/uL (ref 0.0–0.1)
Basophils Relative: 0 %
Eosinophils Absolute: 0.1 10*3/uL (ref 0.0–0.5)
Eosinophils Relative: 2 %
HCT: 32.2 % — ABNORMAL LOW (ref 39.0–52.0)
Hemoglobin: 10.3 g/dL — ABNORMAL LOW (ref 13.0–17.0)
Immature Granulocytes: 1 %
Lymphocytes Relative: 12 %
Lymphs Abs: 0.8 10*3/uL (ref 0.7–4.0)
MCH: 28.7 pg (ref 26.0–34.0)
MCHC: 32 g/dL (ref 30.0–36.0)
MCV: 89.7 fL (ref 80.0–100.0)
Monocytes Absolute: 0.6 10*3/uL (ref 0.1–1.0)
Monocytes Relative: 9 %
Neutro Abs: 5.4 10*3/uL (ref 1.7–7.7)
Neutrophils Relative %: 76 %
Platelets: 113 10*3/uL — ABNORMAL LOW (ref 150–400)
RBC: 3.59 MIL/uL — ABNORMAL LOW (ref 4.22–5.81)
RDW: 14.2 % (ref 11.5–15.5)
WBC: 7 10*3/uL (ref 4.0–10.5)
nRBC: 0 % (ref 0.0–0.2)

## 2023-12-07 LAB — BASIC METABOLIC PANEL
Anion gap: 13 (ref 5–15)
BUN: 71 mg/dL — ABNORMAL HIGH (ref 8–23)
CO2: 27 mmol/L (ref 22–32)
Calcium: 8.7 mg/dL — ABNORMAL LOW (ref 8.9–10.3)
Chloride: 97 mmol/L — ABNORMAL LOW (ref 98–111)
Creatinine, Ser: 5.07 mg/dL — ABNORMAL HIGH (ref 0.61–1.24)
GFR, Estimated: 12 mL/min — ABNORMAL LOW (ref >60)
Glucose, Bld: 208 mg/dL — ABNORMAL HIGH (ref 70–99)
Potassium: 3.7 mmol/L (ref 3.5–5.1)
Sodium: 137 mmol/L (ref 135–145)

## 2023-12-07 MED ORDER — HYDROCODONE-ACETAMINOPHEN 5-325 MG PO TABS
1.0000 | ORAL_TABLET | Freq: Once | ORAL | Status: AC
Start: 2023-12-07 — End: 2023-12-07
  Administered 2023-12-07: 2 via ORAL
  Filled 2023-12-07: qty 2

## 2023-12-07 MED ORDER — CHLORHEXIDINE GLUCONATE CLOTH 2 % EX PADS: 6.0000 | MEDICATED_PAD | Freq: Every day | CUTANEOUS | Status: DC

## 2023-12-07 NOTE — Procedures (Signed)
 We are asked to see this patient for hospital dialysis. The plan will be for ED HD. Pt is not to be admitted. Pt will go to the dialysis unit upstairs when they are ready for the patient. When dialysis is completed pt will be sent back to ED for reassessment.   Pt seen in HD unit, no resp issues, chronic mild LE edema unchanged, UF goal 2.5- 3 L.   I was present at the procedure, reviewed the HD regimen and made appropriate changes.   Myer Fret MD  [facility] 12/07/2023, 12:52 PM

## 2023-12-07 NOTE — ED Notes (Signed)
 Patient back from dialysis.  NAD on baseline 3LNC No complaints

## 2023-12-07 NOTE — Progress Notes (Signed)
 Received patient in bed to unit.  Alert and oriented.  Informed consent signed and in chart.   TX duration:  Patient tolerated well.  Transported back to the room  Alert, without acute distress.  Hand-off given to patient's nurse. Duwaine Rummer, Charge RN  Access used: LUE AVG Access issues: none  Total UF removed: Medication(s) given: [place] see MAR Post HD VS: bp 151/73 p 77 Resp 14 temp 97.9  o@ Sat 99% N/C 2L Post HD weight: unable to obtain  [place], RN [facility]

## 2023-12-07 NOTE — ED Triage Notes (Signed)
 Pt needing dialysis, pt had partial treatment on Tuesday.

## 2023-12-07 NOTE — ED Notes (Signed)
 Pt transported to dialysis

## 2023-12-08 NOTE — ED Provider Notes (Signed)
 [place] EMERGENCY DEPARTMENT AT [facility] Provider Note  CSN: [id number] Arrival date & time: 12/07/23 9260  Chief Complaint(s) No chief complaint on file.  HPI Jeffrey [name] is a 63 y.o. male who presents emergency room and for evaluation of need for routine dialysis. Currently feeling mildly short of breath but denies chest pain, headache, fever or other systemic symptoms.    Past Medical History Past Medical History:  Diagnosis Date   Altered mental status 05/04/2021   Arthritis    Blood transfusion without reported diagnosis    CHF (congestive heart failure) (HCC)    COPD (chronic obstructive pulmonary disease) (HCC)    Depression    Diabetes mellitus without complication (HCC)    type 2   Dyspnea    ESRD on hemodialysis (HCC)    Tues Thurs Sat   GERD (gastroesophageal reflux disease)    protonix    Heart murmur    as a child, no problems   Hypertension    Myocardial infarction [facility])    PTSD (post-traumatic stress disorder)    Sepsis (HCC)    Sleep apnea    occasional uses CPAP   Uses powered wheelchair    Patient Active Problem List   Diagnosis Date Noted   Hyperosmolar hyperglycemic state (HHS) (HCC) 09/17/2023   Chest pain 09/16/2023   Visual field defect of right eye 07/20/2023   Anemia due to chronic kidney disease, on chronic dialysis (HCC) 07/19/2023   Hyperuricemia 07/19/2023   Left arm pain 07/17/2023   Shortness of breath 06/01/2023   Hyperglycemia due to diabetes mellitus (HCC) 04/06/2023   Accelerated hypertension 04/06/2023   ESRD on dialysis (HCC) 03/03/2023   Acute on chronic respiratory failure with hypoxia (HCC) 11/24/2022   Uremia 11/24/2022   Encounter for removal of sutures 07/15/2022   Stress reaction of bone 07/12/2022   Benign prostatic hyperplasia with weak urinary stream 12/30/2021   Swelling of right upper extremity 11/07/2021   S/P reverse total shoulder arthroplasty, right 10/27/2021   AV graft malfunction (HCC)  09/01/2021   Dialysis AV fistula malfunction, initial encounter (HCC) 08/30/2021   Mood disorder (HCC) 08/30/2021   Disorder of ligament, right ankle 08/03/2021   Pain in right ankle and joints of right foot 08/03/2021   Local infection due to central venous catheter, sequela 07/21/2021   Acquired absence of left leg below knee (HCC) 06/21/2021   S/P AKA (above knee amputation), left (HCC) 05/04/2021   Bacteremia 04/20/2021   Other specified joint disorders, left knee 04/20/2021   Iron  deficiency anemia, unspecified 04/13/2021   Anemia of chronic disease    Infection of prosthetic left knee joint (HCC)    MRSA bacteremia 03/30/2021   Left knee pain    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    Fever 03/29/2021   End stage renal disease (HCC) 03/29/2021   COPD (chronic obstructive pulmonary disease) (HCC) 03/29/2021   Type 2 diabetes mellitus with hyperlipidemia (HCC) 03/29/2021   PTSD (post-traumatic stress disorder) 03/29/2021   Dyspnea, unspecified 10/12/2020   Pain in arm, unspecified 10/12/2020   Mild protein-calorie malnutrition (HCC) 09/06/2020   Status post cervical spinal fusion 08/31/2020   Controlled substance agreement signed 08/30/2020   Chronic pain 08/20/2020   Secondary hyperparathyroidism of renal origin (HCC) 08/15/2020   Allergy, unspecified, sequela 07/21/2020   Coagulation defect, unspecified (HCC) 07/21/2020   Encounter for immunization 07/21/2020   Personal history of anaphylaxis 07/21/2020   Cervical myelopathy (HCC) 07/03/2020   Bilateral bunions  05/08/2020   Hammer toes of both feet 05/08/2020   Onychomycosis of toenail 05/08/2020   Muscle cramps 05/07/2020   History of 2019 novel coronavirus disease (COVID-19) 11/06/2019   Cervical radiculopathy 10/28/2019   Volume overload 06/25/2019   Degenerative disc disease, cervical 04/02/2019   History of MRSA infection 10/04/2018   Spinal stenosis of lumbosacral region 08/28/2018   Primary  osteoarthritis of both shoulders 02/12/2018   Rotator cuff arthropathy of both shoulders 02/12/2018   Lumbar degenerative disc disease 01/25/2018   Chronic midline low back pain without sciatica 12/15/2017   History of colon polyps 08/15/2016   Mild episode of recurrent major depressive disorder (HCC) 05/30/2016   Vitamin D  deficiency 04/18/2016   Chronic insomnia 12/21/2015   Pulmonary hypertension, mild (HCC) 04/29/2014   Chronic gastritis without bleeding 01/13/2014   Diabetic polyneuropathy associated with type 2 diabetes mellitus (HCC) 01/13/2014   Chronic diastolic heart failure (HCC) 12/24/2013   OSA (obstructive sleep apnea) 12/24/2013   Primary osteoarthritis of both knees 08/26/2013   Primary osteoarthritis of right knee 08/26/2013   Status post total bilateral knee replacement 05/02/2013   Mixed hyperlipidemia 07/30/2012   Class 3 severe obesity due to excess calories with serious comorbidity and body mass index (BMI) of 45.0 to 49.9 in adult (HCC) 07/27/2012   Gastroesophageal reflux disease without esophagitis 05/12/2008   Erectile dysfunction due to diseases classified elsewhere 01/19/2005   Home Medication(s) Prior to Admission medications   Medication Sig Start Date End Date Taking? Authorizing Provider  acetaminophen  (TYLENOL ) 325 MG tablet Take 2 tablets (650 mg total) by mouth every 6 (six) hours. 07/23/23   Nicholas Bar, MD  amLODipine  (NORVASC ) 10 MG tablet Take 10 mg by mouth every morning. 04/17/20   [provider]  ASPIRIN  LOW DOSE 81 MG EC tablet Take 81 mg by mouth in the morning. 02/22/21   [provider]  atorvastatin  (LIPITOR) 40 MG tablet Take 40 mg by mouth in the morning. 02/09/21   [provider]  calcium  acetate (PHOSLO ) 667 MG capsule Take 2 capsules (1,334 mg total) by mouth with breakfast, with lunch, and with evening meal. 04/10/21   Earley Saucer, MD  carvedilol  (COREG ) 12.5 MG tablet Take 12.5 mg by mouth 2 (two) times  daily with a meal. 04/17/20   [provider]  clopidogrel  (PLAVIX ) 75 MG tablet Take 1 tablet (75 mg total) by mouth daily. 11/02/22   Rhyne, Samantha J, PA-C  diazepam  (VALIUM ) 5 MG tablet Take 5 mg by mouth 2 (two) times daily as needed for muscle spasms. 12/09/21   [provider]  Fluticasone -Umeclidin-Vilant (TRELEGY ELLIPTA IN) Inhale 1 puff into the lungs daily.    [provider]  insulin  lispro (HUMALOG  KWIKPEN) 100 UNIT/ML KwikPen Inject 0-9 Units into the skin 3 (three) times daily with meals. CBG < 70: treat low blood sugar CBG 70 - 120: 0 units  CBG 121 - 150: 1 unit  CBG 151 - 200: 2 units  CBG 201 - 250: 3 units  CBG 251 - 300: 5 units  CBG 301 - 350: 7 units  CBG 351 - 400: 9 units  CBG > 400: call MD 09/27/23   Perri DELENA Meliton Mickey., MD  insulin  regular human CONCENTRATED (HUMULIN  R U-500 KWIKPEN) 500 UNIT/ML KwikPen Inject 55 Units into the skin 2 (two) times daily with a meal. Follow up with your PCP and [facility] for additional adjustments 09/27/23 10/27/23  Perri DELENA Meliton Mickey., MD  iron   polysaccharides (NIFEREX) 150 MG capsule Take 1 capsule (150 mg total) by mouth daily. 09/26/23   Drusilla Sabas RAMAN, MD  nortriptyline  (PAMELOR ) 10 MG capsule Take 20 mg by mouth at bedtime.    [provider]  pantoprazole  (PROTONIX ) 40 MG tablet Take 40 mg by mouth daily before breakfast. 02/09/21   [provider]  pregabalin  (LYRICA ) 75 MG capsule Take 1 capsule (75 mg total) by mouth See admin instructions. Daily.  Give after dialysis on dialysis days. Patient taking differently: Take 75 mg by mouth daily. 01/21/23   Randol Simmonds, MD  sertraline  (ZOLOFT ) 100 MG tablet Take 100 mg by mouth in the morning. 02/22/21   [provider]  tamsulosin  (FLOMAX ) 0.4 MG CAPS capsule Take 1 capsule (0.4 mg total) by mouth daily. 11/10/21   Vernon Ranks, MD  torsemide  (DEMADEX ) 20 MG tablet Take 120 mg by mouth 2 (two) times daily.    [provider]  traZODone  (DESYREL ) 150 MG tablet Take 150 mg by mouth at bedtime. 12/09/21   [provider]  Vitamin D , Ergocalciferol , (DRISDOL ) 1.25 MG (50000 UNIT) CAPS capsule Take 1 capsule (50,000 Units total) by mouth every 7 (seven) days. 10/02/23   Drusilla Sabas RAMAN, MD                                                                                                                                    Past Surgical History Past Surgical History:  Procedure Laterality Date   A/V FISTULAGRAM N/A 08/30/2021   Procedure: A/V FISTULAGRAM;  Surgeon: Sheree Penne Bruckner, MD;  Location: [facility] INVASIVE CV LAB;  Service: Cardiovascular;  Laterality: N/A;   A/V FISTULAGRAM Right 07/28/2022   Procedure: A/V Fistulagram;  Surgeon: Sheree Penne Bruckner, MD;  Location: [facility] INVASIVE CV LAB;  Service: Cardiovascular;  Laterality: Right;   A/V FISTULAGRAM Right 09/05/2022   Procedure: A/V Fistulagram;  Surgeon: Sheree Penne Bruckner, MD;  Location: [facility] INVASIVE CV LAB;  Service: Cardiovascular;  Laterality: Right;   A/V FISTULAGRAM Right 10/31/2022   Procedure: A/V Fistulagram;  Surgeon: Sheree Penne Bruckner, MD;  Location: [facility] INVASIVE CV LAB;  Service: Cardiovascular;  Laterality: Right;   A/V SHUNT INTERVENTION Right 10/31/2022   Procedure: A/V SHUNT INTERVENTION;  Surgeon: Sheree Penne Bruckner, MD;  Location: [facility] INVASIVE CV LAB;  Service: Cardiovascular;  Laterality: Right;   A/V SHUNTOGRAM N/A 11/08/2021   Procedure: A/V SHUNTOGRAM;  Surgeon: Sheree Penne Bruckner, MD;  Location: [facility] INVASIVE CV LAB;  Service: Cardiovascular;  Laterality: N/A;   AMPUTATION Left 04/03/2021   Procedure: LEFT ABOVE-THE-KNEE AMPUTATION;  Surgeon: Elsa Bruckner SAUNDERS, MD;  Location: [facility] OR;  Service: Orthopedics;  Laterality: Left;   AV FISTULA PLACEMENT Right 08/12/2021   Procedure: INSERTION OF ARTERIOVENOUS (AV) GORE-TEX GRAFT RIGHT ARM;  Surgeon: Serene Gaile ORN, MD;  Location: [facility] OR;  Service:  Vascular;  Laterality: Right;   AV FISTULA PLACEMENT Left 04/07/2023  Procedure: ARTERIOVENOUS (AV) FISTULA GRAFT USING GORETEX STRETCH (4-7MM);  Surgeon: Eliza Lonni RAMAN, MD;  Location: [facility] OR;  Service: Vascular;  Laterality: Left;   INSERTION OF DIALYSIS CATHETER N/A 11/25/2022   Procedure: INSERTION OF DIALYSIS CATHETER;  Surgeon: Claudene Toribio BROCKS, MD;  Location: [facility] ENDOSCOPY;  Service: Pulmonary;  Laterality: N/A;   INSERTION OF DIALYSIS CATHETER N/A 03/03/2023   Procedure: INSERTION OF TUNNELED DIALYSIS CATHETER;  Surgeon: Sheree Penne Lonni, MD;  Location: [facility] OR;  Service: Vascular;  Laterality: N/A;   INSERTION OF DIALYSIS CATHETER Left 04/07/2023   Procedure: INSERTION OF DIALYSIS CATHETER USING PALINDROME  28CM PRECISION CHRONIC CATHETER KIT;  Surgeon: Eliza Lonni RAMAN, MD;  Location: [facility] OR;  Service: Vascular;  Laterality: Left;   INSERTION OF DIALYSIS CATHETER Left 07/18/2023   Procedure: INSERTION OF TUNNELED DIALYSIS CATHETER USING 23cm PALINDROME CHRONIC CATHETER;  Surgeon: Eliza Lonni RAMAN, MD;  Location: [facility] OR;  Service: Vascular;  Laterality: Left;   IR REMOVAL TUN CV CATH W/O FL  03/30/2021   JOINT REPLACEMENT     Bilateral knees   LIGATION ARTERIOVENOUS GORTEX GRAFT Right 03/03/2023   Procedure: LIGATION RIGHT ARM ARTERIOVENOUS GORTEX GRAFT;  Surgeon: Sheree Penne Lonni, MD;  Location: [facility] OR;  Service: Vascular;  Laterality: Right;   PERIPHERAL VASCULAR BALLOON ANGIOPLASTY Right 08/30/2021   Procedure: PERIPHERAL VASCULAR BALLOON ANGIOPLASTY;  Surgeon: Sheree Penne Lonni, MD;  Location: [facility] INVASIVE CV LAB;  Service: Cardiovascular;  Laterality: Right;   PERIPHERAL VASCULAR BALLOON ANGIOPLASTY Right 07/28/2022   Procedure: PERIPHERAL VASCULAR BALLOON ANGIOPLASTY;  Surgeon: Sheree Penne Lonni, MD;  Location: [facility] INVASIVE CV LAB;  Service: Cardiovascular;  Laterality: Right;  AVF   PERIPHERAL VASCULAR INTERVENTION  11/08/2021   Procedure:  PERIPHERAL VASCULAR INTERVENTION;  Surgeon: Sheree Penne Lonni, MD;  Location: [facility] INVASIVE CV LAB;  Service: Cardiovascular;;  Central rt arm fistula   UPPER EXTREMITY VENOGRAPHY Right 08/10/2021   Procedure: UPPER EXTREMITY VENOGRAPHY;  Surgeon: Serene Gaile ORN, MD;  Location: [facility] INVASIVE CV LAB;  Service: Cardiovascular;  Laterality: Right;   Family History History reviewed. No pertinent family history.  Social History Social History   Tobacco Use   Smoking status: Former    Current packs/day: 0.00    Types: Cigarettes    Quit date: 2012    Years since quitting: 13.0   Smokeless tobacco: Never  Vaping Use   Vaping status: Never Used  Substance Use Topics   Alcohol  use: Yes    Comment: occasionally   Drug use: Not Currently    Comment: Years ago   Allergies Actos [pioglitazone], Dexmedetomidine , Ibuprofen, Tomato, and Wellbutrin [bupropion]  Review of Systems Review of Systems  All other systems reviewed and are negative.   Physical Exam Vital Signs  I have reviewed the triage vital signs BP (!) 151/73   Pulse 77   Temp 97.9 F (36.6 C)   Resp 14   SpO2 99%   Physical Exam Constitutional:      General: He is not in acute distress.    Appearance: Normal appearance.  HENT:     Head: Normocephalic and atraumatic.     Nose: No congestion or rhinorrhea.  Eyes:     General:        Right eye: No discharge.        Left eye: No discharge.     Extraocular Movements: Extraocular movements intact.     Pupils: Pupils are equal, round, and reactive to light.  Cardiovascular:  Rate and Rhythm: Normal rate and regular rhythm.     Heart sounds: No murmur heard. Pulmonary:     Effort: No respiratory distress.     Breath sounds: No wheezing or rales.  Abdominal:     General: There is no distension.     Tenderness: There is no abdominal tenderness.  Musculoskeletal:        General: Normal range of motion.     Cervical back: Normal range of motion.  Skin:     General: Skin is warm and dry.  Neurological:     General: No focal deficit present.     Mental Status: He is alert.     ED Results and Treatments Labs (all labs ordered are listed, but only abnormal results are displayed) Labs Reviewed  CBC WITH DIFFERENTIAL/PLATELET - Abnormal; Notable for the following components:      Result Value   RBC 3.59 (*)    Hemoglobin 10.3 (*)    HCT 32.2 (*)    Platelets 113 (*)    All other components within normal limits  BASIC METABOLIC PANEL - Abnormal; Notable for the following components:   Chloride 97 (*)    Glucose, Bld 208 (*)    BUN 71 (*)    Creatinine, Ser 5.07 (*)    Calcium  8.7 (*)    GFR, Estimated 12 (*)    All other components within normal limits                                                                                                                          Radiology No results found.  Pertinent labs & imaging results that were available during my care of the patient were reviewed by me and considered in my medical decision making (see MDM for details).  Medications Ordered in ED Medications  HYDROcodone -acetaminophen  (NORCO/VICODIN) 5-325 MG per tablet 1-2 tablet (2 tablets Oral Given 12/07/23 1038)                                                                                                                                     Procedures Procedures  (including critical care time)  Medical Decision Making / ED Course   This patient presents to the ED for concern of need for dialysis, this involves an extensive number of treatment options, and is a complaint that carries with it a high risk of complications  and morbidity.  The differential diagnosis includes need for routine dialysis, ESRD, pneumonia, fluid overload  MDM: Patient seen emergency room for evaluation of need for dialysis.  Patient went to the dialysis suite, received dialysis and returns with no complaints.  Patient discharged   Additional history  obtained:  -External records from outside source obtained and reviewed including: Chart review including previous notes, labs, imaging, consultation notes   Lab Tests: -I ordered, reviewed, and interpreted labs.   The pertinent results include:   Labs Reviewed  CBC WITH DIFFERENTIAL/PLATELET - Abnormal; Notable for the following components:      Result Value   RBC 3.59 (*)    Hemoglobin 10.3 (*)    HCT 32.2 (*)    Platelets 113 (*)    All other components within normal limits  BASIC METABOLIC PANEL - Abnormal; Notable for the following components:   Chloride 97 (*)    Glucose, Bld 208 (*)    BUN 71 (*)    Creatinine, Ser 5.07 (*)    Calcium  8.7 (*)    GFR, Estimated 12 (*)    All other components within normal limits      Medicines ordered and prescription drug management: Meds ordered this encounter  Medications   DISCONTD: Chlorhexidine  Gluconate Cloth 2 % PADS 6 each   HYDROcodone -acetaminophen  (NORCO/VICODIN) 5-325 MG per tablet 1-2 tablet    Refill:  0    -I have reviewed the patients home medicines and have made adjustments as needed  Critical interventions none  Consultations Obtained: I requested consultation with the nephrologist on-call,  and discussed lab and imaging findings as well as pertinent plan - they recommend: Dialysis   Social Determinants of Health:  Factors impacting patients care include: none   Reevaluation: After the interventions noted above, I reevaluated the patient and found that they have :improved  Co morbidities that complicate the patient evaluation  Past Medical History:  Diagnosis Date   Altered mental status 05/04/2021   Arthritis    Blood transfusion without reported diagnosis    CHF (congestive heart failure) (HCC)    COPD (chronic obstructive pulmonary disease) (HCC)    Depression    Diabetes mellitus without complication (HCC)    type 2   Dyspnea    ESRD on hemodialysis (HCC)    Tues Thurs Sat   GERD  (gastroesophageal reflux disease)    protonix    Heart murmur    as a child, no problems   Hypertension    Myocardial infarction (HCC)    PTSD (post-traumatic stress disorder)    Sepsis (HCC)    Sleep apnea    occasional uses CPAP   Uses powered wheelchair       Dispostion: I considered admission for this patient, but he does not meet inpatient criteria for admission and was discharged     Final Clinical Impression(s) / ED Diagnoses Final diagnoses:  ESRD (end stage renal disease) (HCC)     @PCDICTATION @    Albertina Dixon, MD 12/08/23 1424

## 2023-12-12 ENCOUNTER — Encounter (HOSPITAL_COMMUNITY): Payer: Self-pay | Admitting: Pharmacy Technician

## 2023-12-12 ENCOUNTER — Other Ambulatory Visit: Payer: Self-pay

## 2023-12-12 ENCOUNTER — Emergency Department (HOSPITAL_COMMUNITY)
Admission: EM | Admit: 2023-12-12 | Discharge: 2023-12-12 | Discharge status: Against Medical Advice | Payer: 59 | Attending: Emergency Medicine | Admitting: Emergency Medicine

## 2023-12-12 DIAGNOSIS — Z5321 Procedure and treatment not carried out due to patient leaving prior to being seen by health care provider: Secondary | ICD-10-CM | POA: Diagnosis not present

## 2023-12-12 DIAGNOSIS — N186 End stage renal disease: Secondary | ICD-10-CM | POA: Diagnosis present

## 2023-12-12 DIAGNOSIS — Z992 Dependence on renal dialysis: Secondary | ICD-10-CM | POA: Insufficient documentation

## 2023-12-12 LAB — CBC
HCT: 31 % — ABNORMAL LOW (ref 39.0–52.0)
Hemoglobin: 9.9 g/dL — ABNORMAL LOW (ref 13.0–17.0)
MCH: 28.6 pg (ref 26.0–34.0)
MCHC: 31.9 g/dL (ref 30.0–36.0)
MCV: 89.6 fL (ref 80.0–100.0)
Platelets: 114 10*3/uL — ABNORMAL LOW (ref 150–400)
RBC: 3.46 MIL/uL — ABNORMAL LOW (ref 4.22–5.81)
RDW: 14.1 % (ref 11.5–15.5)
WBC: 6.4 10*3/uL (ref 4.0–10.5)
nRBC: 0 % (ref 0.0–0.2)

## 2023-12-12 LAB — RENAL FUNCTION PANEL
Albumin: 3.5 g/dL (ref 3.5–5.0)
Anion gap: 12 (ref 5–15)
BUN: 76 mg/dL — ABNORMAL HIGH (ref 8–23)
CO2: 26 mmol/L (ref 22–32)
Calcium: 8.3 mg/dL — ABNORMAL LOW (ref 8.9–10.3)
Chloride: 98 mmol/L (ref 98–111)
Creatinine, Ser: 5.67 mg/dL — ABNORMAL HIGH (ref 0.61–1.24)
GFR, Estimated: 11 mL/min — ABNORMAL LOW (ref >60)
Glucose, Bld: 363 mg/dL — ABNORMAL HIGH (ref 70–99)
Phosphorus: 5.5 mg/dL — ABNORMAL HIGH (ref 2.5–4.6)
Potassium: 4.5 mmol/L (ref 3.5–5.1)
Sodium: 136 mmol/L (ref 135–145)

## 2023-12-12 MED ORDER — HYDROMORPHONE HCL 1 MG/ML IJ SOLN
0.5000 mg | Freq: Once | INTRAMUSCULAR | Status: AC
Start: 2023-12-12 — End: 2023-12-12
  Administered 2023-12-12: 0.5 mg via INTRAVENOUS
  Filled 2023-12-12: qty 0.5

## 2023-12-12 MED ORDER — HEPARIN SODIUM (PORCINE) 1000 UNIT/ML DIALYSIS
2000.0000 [IU] | INTRAMUSCULAR | Status: DC | PRN
Start: 2023-12-12 — End: 2023-12-12

## 2023-12-12 MED ORDER — CHLORHEXIDINE GLUCONATE CLOTH 2 % EX PADS: 6.0000 | MEDICATED_PAD | Freq: Every day | CUTANEOUS | Status: DC

## 2023-12-12 MED ORDER — HEPARIN SODIUM (PORCINE) 1000 UNIT/ML IJ SOLN
INTRAMUSCULAR | Status: AC
  Administered 2023-12-12: 2000 [IU] via INTRAVENOUS_CENTRAL
  Filled 2023-12-12: qty 2

## 2023-12-12 NOTE — ED Provider Notes (Signed)
 Pt came back from dialysis, but left ED prior to my being able to evaluate the patient.   Rolan Bucco, MD 12/12/23 [phone]

## 2023-12-12 NOTE — ED Notes (Signed)
 Pt did not wait for D/C vitals

## 2023-12-12 NOTE — ED Triage Notes (Signed)
 Pt here for dialysis. Last treatment was Thursday. No complaints.

## 2023-12-12 NOTE — Progress Notes (Signed)
   12/12/23 1506  Vitals  Temp 98 F (36.7 C)  Pulse Rate 81  Resp 12  BP (!) 155/83 (Simultaneous filing. User may not have seen previous data.)  SpO2 97 %  O2 Device Nasal Cannula  Weight  (Unable to obtain due to stretcher)  Oxygen Therapy  O2 Flow Rate (L/min) 3 L/min  Patient Activity (if Appropriate) In bed  Pulse Oximetry Type Continuous  Oximetry Probe Site Changed No  Post Treatment  Dialyzer Clearance Lightly streaked  Hemodialysis Intake (mL) 0 mL  Liters Processed 72  Fluid Removed (mL) 2500 mL  Tolerated HD Treatment Yes  AVG/AVF Arterial Site Held (minutes) 5 minutes  AVG/AVF Venous Site Held (minutes) 5 minutes  Duration of HD Treatment -hour(s) 3 hour(s)   Received patient in bed to unit.  Alert and oriented.  Informed consent signed and in chart.   TX duration: 3 hours  Patient tolerated well.  Transported back to the room  Alert, without acute distress.  Hand-off given to patient's nurse.   Access used: Left arm fistula Access issues: None  Total UF removed: 2.5L Medication(s) given: Dilaudid  0.5mg

## 2023-12-14 ENCOUNTER — Encounter (HOSPITAL_COMMUNITY): Payer: Self-pay

## 2023-12-14 ENCOUNTER — Emergency Department (HOSPITAL_COMMUNITY)
Admission: EM | Admit: 2023-12-14 | Discharge: 2023-12-15 | Disposition: A | Discharge status: Home / Self Care | Payer: 59

## 2023-12-14 ENCOUNTER — Other Ambulatory Visit: Payer: Self-pay

## 2023-12-14 DIAGNOSIS — Z7982 Long term (current) use of aspirin: Secondary | ICD-10-CM | POA: Insufficient documentation

## 2023-12-14 DIAGNOSIS — Z992 Dependence on renal dialysis: Secondary | ICD-10-CM | POA: Diagnosis not present

## 2023-12-14 DIAGNOSIS — M542 Cervicalgia: Secondary | ICD-10-CM | POA: Insufficient documentation

## 2023-12-14 DIAGNOSIS — N186 End stage renal disease: Secondary | ICD-10-CM | POA: Insufficient documentation

## 2023-12-14 DIAGNOSIS — Z794 Long term (current) use of insulin: Secondary | ICD-10-CM | POA: Insufficient documentation

## 2023-12-14 LAB — I-STAT CHEM 8, ED
BUN: 60 mg/dL — ABNORMAL HIGH (ref 8–23)
Calcium, Ion: 1.07 mmol/L — ABNORMAL LOW (ref 1.15–1.40)
Chloride: 99 mmol/L (ref 98–111)
Creatinine, Ser: 5.6 mg/dL — ABNORMAL HIGH (ref 0.61–1.24)
Glucose, Bld: 222 mg/dL — ABNORMAL HIGH (ref 70–99)
HCT: 35 % — ABNORMAL LOW (ref 39.0–52.0)
Hemoglobin: 11.9 g/dL — ABNORMAL LOW (ref 13.0–17.0)
Potassium: 4.1 mmol/L (ref 3.5–5.1)
Sodium: 137 mmol/L (ref 135–145)
TCO2: 28 mmol/L (ref 22–32)

## 2023-12-14 LAB — CBG MONITORING, ED
Glucose-Capillary: 249 mg/dL — ABNORMAL HIGH (ref 70–99)
Glucose-Capillary: 332 mg/dL — ABNORMAL HIGH (ref 70–99)

## 2023-12-14 MED ORDER — HYDROCODONE-ACETAMINOPHEN 5-325 MG PO TABS
1.0000 | ORAL_TABLET | Freq: Once | ORAL | Status: AC
  Administered 2023-12-14: 1 via ORAL
  Filled 2023-12-14: qty 1

## 2023-12-14 MED ORDER — INSULIN LISPRO (1 UNIT DIAL) 100 UNIT/ML (KWIKPEN): 0.0000 [IU] | PEN_INJECTOR | Freq: Three times a day (TID) | SUBCUTANEOUS | Status: DC

## 2023-12-14 MED ORDER — AMLODIPINE BESYLATE 5 MG PO TABS
10.0000 mg | ORAL_TABLET | Freq: Every morning | ORAL | Status: DC
  Administered 2023-12-14: 10 mg via ORAL
  Filled 2023-12-14: qty 2

## 2023-12-14 MED ORDER — AMLODIPINE BESYLATE 5 MG PO TABS: 10.0000 mg | ORAL_TABLET | Freq: Every morning | ORAL | Status: DC

## 2023-12-14 MED ORDER — INSULIN ASPART 100 UNIT/ML IJ SOLN
0.0000 [IU] | INTRAMUSCULAR | Status: DC
  Administered 2023-12-14: 4 [IU] via SUBCUTANEOUS
  Administered 2023-12-14: 2 [IU] via SUBCUTANEOUS

## 2023-12-14 MED ORDER — CHLORHEXIDINE GLUCONATE CLOTH 2 % EX PADS: 6.0000 | MEDICATED_PAD | Freq: Every day | CUTANEOUS | Status: DC

## 2023-12-14 MED ORDER — LIDOCAINE 5 % EX PTCH
1.0000 | MEDICATED_PATCH | Freq: Once | CUTANEOUS | Status: AC
Start: 2023-12-14 — End: 2023-12-14
  Administered 2023-12-14: 1 via TRANSDERMAL
  Filled 2023-12-14: qty 1

## 2023-12-14 NOTE — ED Provider Notes (Signed)
  Physical Exam  BP (!) 145/98 (BP Location: Right Arm)   Pulse 87   Temp 98.4 F (36.9 C) (Oral)   Resp 11   Ht 5\' 5"  (1.651 m)   Wt 135.2 kg   SpO2 100%   BMI 49.59 kg/m   Physical Exam  Procedures  Procedures  ED Course / MDM   Clinical Course as of 12/15/23 0026  Thu Dec 14, 2023  0855 Discussed with nephrology; will take to dialysis today [TY]  1301 Informed by nurse that patient will be unable to do dialysis until after 6 PM.  Need to be admitted secondary to transportation issues.  No clear formation at this time as vitals are stable maintaining oxygen saturation on room air.  Will get i-STAT to evaluate for hyperkalemia. [TY]  1531 Patient without indication for admission. He wants to stay for dialysis this evening and possibly stay night until transport can be arranged.  [TY]  1630 Assumed care from Dr Maple Hudson. Pt awaiting iHD.  [RP]  Fri Dec 15, 2023  0025 Patient had dialysis now.  Will need to be reassessed afterwards and may be able to be discharged.  Dr. Clayborne Dana made aware. [RP]    Clinical Course User Index [RP] Rondel Baton, MD [TY] Coral Spikes, DO   Medical Decision Making Risk Prescription drug management.      Rondel Baton, MD 12/15/23 [phone]

## 2023-12-14 NOTE — ED Triage Notes (Signed)
Pt. Stated, Here for dialysis . Completed treatment on Tuesday. Continues to have pain in my neck.
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
## 2023-12-15 NOTE — Progress Notes (Signed)
CSW spoke with Hilda Lias at [facility] who states patient is no longer active with the agency and that he needs to contact [facility] to determine who the transportation provider will be moving forward.  CSW spoke with [employer] who states they can assist patient with transportation home. Staff states patient is third on the list for pickup.  CSW informed RN and NT of information.  Edwin Dada, MSW, LCSW Transitions of Care  Clinical Social Worker II [phone]

## 2023-12-15 NOTE — ED Provider Notes (Signed)
  Physical Exam  BP (!) 144/60 (BP Location: Right Arm)   Pulse 88   Temp 98.2 F (36.8 C) (Oral)   Resp 18   Ht 5\' 5"  (1.651 m)   Wt 135.2 kg   SpO2 96%   BMI 49.59 kg/m   Physical Exam  Procedures  Procedures  ED Course / MDM   Clinical Course as of 12/15/23 0251  Thu Dec 14, 2023  0855 Discussed with nephrology; will take to dialysis today [TY]  1301 Informed by nurse that patient will be unable to do dialysis until after 6 PM.  Need to be admitted secondary to transportation issues.  No clear formation at this time as vitals are stable maintaining oxygen saturation on room air.  Will get i-STAT to evaluate for hyperkalemia. [TY]  1531 Patient without indication for admission. He wants to stay for dialysis this evening and possibly stay night until transport can be arranged.  [TY]  1630 Assumed care from Dr Maple Hudson. Pt awaiting iHD.  [RP]  Fri Dec 15, 2023  0025 Patient had dialysis now.  Will need to be reassessed afterwards and may be able to be discharged.  Dr. Clayborne Dana made aware. [RP]    Clinical Course User Index [RP] Rondel Baton, MD [TY] Coral Spikes, DO   Medical Decision Making Risk Prescription drug management.   Patient return to the emergency department after completing hemodialysis.  No complaints at this time.  Stable for discharge.       Pamala Duffel 12/15/23 0251    Marily Memos, MD 12/15/23 [phone]

## 2023-12-19 ENCOUNTER — Emergency Department (HOSPITAL_COMMUNITY): Payer: 59

## 2023-12-19 ENCOUNTER — Emergency Department (HOSPITAL_COMMUNITY)
Admission: EM | Admit: 2023-12-19 | Discharge: 2023-12-19 | Discharge status: Against Medical Advice | Payer: 59 | Attending: Emergency Medicine | Admitting: Emergency Medicine

## 2023-12-19 ENCOUNTER — Other Ambulatory Visit: Payer: Self-pay

## 2023-12-19 DIAGNOSIS — Z794 Long term (current) use of insulin: Secondary | ICD-10-CM | POA: Diagnosis not present

## 2023-12-19 DIAGNOSIS — R531 Weakness: Secondary | ICD-10-CM | POA: Diagnosis not present

## 2023-12-19 DIAGNOSIS — N186 End stage renal disease: Secondary | ICD-10-CM | POA: Diagnosis not present

## 2023-12-19 DIAGNOSIS — J449 Chronic obstructive pulmonary disease, unspecified: Secondary | ICD-10-CM | POA: Insufficient documentation

## 2023-12-19 DIAGNOSIS — R202 Paresthesia of skin: Secondary | ICD-10-CM | POA: Insufficient documentation

## 2023-12-19 DIAGNOSIS — E1122 Type 2 diabetes mellitus with diabetic chronic kidney disease: Secondary | ICD-10-CM | POA: Diagnosis not present

## 2023-12-19 DIAGNOSIS — Z7951 Long term (current) use of inhaled steroids: Secondary | ICD-10-CM | POA: Diagnosis not present

## 2023-12-19 DIAGNOSIS — H00012 Hordeolum externum right lower eyelid: Secondary | ICD-10-CM | POA: Insufficient documentation

## 2023-12-19 DIAGNOSIS — I120 Hypertensive chronic kidney disease with stage 5 chronic kidney disease or end stage renal disease: Secondary | ICD-10-CM | POA: Diagnosis not present

## 2023-12-19 DIAGNOSIS — Z79899 Other long term (current) drug therapy: Secondary | ICD-10-CM | POA: Insufficient documentation

## 2023-12-19 DIAGNOSIS — Z992 Dependence on renal dialysis: Secondary | ICD-10-CM | POA: Insufficient documentation

## 2023-12-19 DIAGNOSIS — M47812 Spondylosis without myelopathy or radiculopathy, cervical region: Secondary | ICD-10-CM | POA: Diagnosis not present

## 2023-12-19 DIAGNOSIS — Z7982 Long term (current) use of aspirin: Secondary | ICD-10-CM | POA: Diagnosis not present

## 2023-12-19 DIAGNOSIS — M542 Cervicalgia: Secondary | ICD-10-CM | POA: Diagnosis present

## 2023-12-19 LAB — BASIC METABOLIC PANEL
Anion gap: 12 (ref 5–15)
BUN: 87 mg/dL — ABNORMAL HIGH (ref 8–23)
CO2: 26 mmol/L (ref 22–32)
Calcium: 8.6 mg/dL — ABNORMAL LOW (ref 8.9–10.3)
Chloride: 100 mmol/L (ref 98–111)
Creatinine, Ser: 6.42 mg/dL — ABNORMAL HIGH (ref 0.61–1.24)
GFR, Estimated: 9 mL/min — ABNORMAL LOW (ref >60)
Glucose, Bld: 161 mg/dL — ABNORMAL HIGH (ref 70–99)
Potassium: 4.2 mmol/L (ref 3.5–5.1)
Sodium: 138 mmol/L (ref 135–145)

## 2023-12-19 LAB — CBC WITH DIFFERENTIAL/PLATELET
Abs Immature Granulocytes: 0.06 10*3/uL (ref 0.00–0.07)
Basophils Absolute: 0 10*3/uL (ref 0.0–0.1)
Basophils Relative: 0 %
Eosinophils Absolute: 0.2 10*3/uL (ref 0.0–0.5)
Eosinophils Relative: 2 %
HCT: 30.8 % — ABNORMAL LOW (ref 39.0–52.0)
Hemoglobin: 10 g/dL — ABNORMAL LOW (ref 13.0–17.0)
Immature Granulocytes: 1 %
Lymphocytes Relative: 9 %
Lymphs Abs: 0.7 10*3/uL (ref 0.7–4.0)
MCH: 28.7 pg (ref 26.0–34.0)
MCHC: 32.5 g/dL (ref 30.0–36.0)
MCV: 88.3 fL (ref 80.0–100.0)
Monocytes Absolute: 0.5 10*3/uL (ref 0.1–1.0)
Monocytes Relative: 7 %
Neutro Abs: 6.5 10*3/uL (ref 1.7–7.7)
Neutrophils Relative %: 81 %
Platelets: 134 10*3/uL — ABNORMAL LOW (ref 150–400)
RBC: 3.49 MIL/uL — ABNORMAL LOW (ref 4.22–5.81)
RDW: 13.6 % (ref 11.5–15.5)
WBC: 7.9 10*3/uL (ref 4.0–10.5)
nRBC: 0 % (ref 0.0–0.2)

## 2023-12-19 LAB — HEPATITIS B SURFACE ANTIGEN: Hepatitis B Surface Ag: NONREACTIVE

## 2023-12-19 MED ORDER — HYDROMORPHONE HCL 1 MG/ML IJ SOLN
0.5000 mg | INTRAMUSCULAR | Status: DC | PRN
  Administered 2023-12-19: 0.5 mg via INTRAVENOUS
  Filled 2023-12-19: qty 0.5

## 2023-12-19 MED ORDER — LIDOCAINE-PRILOCAINE 2.5-2.5 % EX CREA: 1.0000 | TOPICAL_CREAM | CUTANEOUS | Status: DC | PRN

## 2023-12-19 MED ORDER — TETRACAINE HCL 0.5 % OP SOLN
2.0000 [drp] | Freq: Once | OPHTHALMIC | Status: AC
  Administered 2023-12-19: 2 [drp] via OPHTHALMIC
  Filled 2023-12-19: qty 4

## 2023-12-19 MED ORDER — HEPARIN SODIUM (PORCINE) 1000 UNIT/ML DIALYSIS: 1000.0000 [IU] | INTRAMUSCULAR | Status: DC | PRN

## 2023-12-19 MED ORDER — HEPARIN SODIUM (PORCINE) 1000 UNIT/ML DIALYSIS
2000.0000 [IU] | INTRAMUSCULAR | Status: DC | PRN
  Administered 2023-12-19: 2000 [IU] via INTRAVENOUS_CENTRAL
  Filled 2023-12-19 (×2): qty 2

## 2023-12-19 MED ORDER — ACETAMINOPHEN 500 MG PO TABS
1000.0000 mg | ORAL_TABLET | Freq: Once | ORAL | Status: AC
  Administered 2023-12-19: 1000 mg via ORAL
  Filled 2023-12-19: qty 2

## 2023-12-19 MED ORDER — CHLORHEXIDINE GLUCONATE CLOTH 2 % EX PADS: 6.0000 | MEDICATED_PAD | Freq: Every day | CUTANEOUS | Status: DC

## 2023-12-19 MED ORDER — LIDOCAINE HCL (PF) 1 % IJ SOLN: 5.0000 mL | INTRAMUSCULAR | Status: DC | PRN

## 2023-12-19 MED ORDER — NEPRO/CARBSTEADY PO LIQD
237.0000 mL | ORAL | Status: DC | PRN
Start: 2023-12-19 — End: 2023-12-19

## 2023-12-19 MED ORDER — LIDOCAINE 5 % EX PTCH
1.0000 | MEDICATED_PATCH | Freq: Once | CUTANEOUS | Status: DC
  Filled 2023-12-19: qty 1

## 2023-12-19 MED ORDER — HYDROCODONE-ACETAMINOPHEN 5-325 MG PO TABS
2.0000 | ORAL_TABLET | Freq: Once | ORAL | Status: AC
Start: 2023-12-19 — End: 2023-12-19
  Administered 2023-12-19: 2 via ORAL
  Filled 2023-12-19: qty 2

## 2023-12-19 MED ORDER — PENTAFLUOROPROP-TETRAFLUOROETH EX AERO: 1.0000 | INHALATION_SPRAY | CUTANEOUS | Status: DC | PRN

## 2023-12-19 MED ORDER — ANTICOAGULANT SODIUM CITRATE 4% (200MG/5ML) IV SOLN
5.0000 mL | Status: DC | PRN
Start: 2023-12-19 — End: 2023-12-19

## 2023-12-19 MED ORDER — ALTEPLASE 2 MG IJ SOLR
2.0000 mg | Freq: Once | INTRAMUSCULAR | Status: DC | PRN
Start: 2023-12-19 — End: 2023-12-19

## 2023-12-19 MED ORDER — DARBEPOETIN ALFA 200 MCG/0.4ML IJ SOSY
200.0000 ug | PREFILLED_SYRINGE | Freq: Once | INTRAMUSCULAR | Status: DC
  Filled 2023-12-19: qty 0.4

## 2023-12-19 NOTE — Procedures (Signed)
Asked to see patient for hospital dialysis. Patient is not being admitted. The plan will be for "ED HD". Pt will go to the dialysis unit upstairs when they are ready for the patient. When dialysis is completed pt will be sent back to ED for reassessment.      Last OP HD orders (dec 2023):  3:15h  129kg  RUE AVG   Heparin 2000       Clinical issues: 1) Anemia of esrd:  ** Pt dose not have an outpatient nephrologist or HD unit so does not have access to ESA and other needed medications.  ** pt had darbe in late April and in early May, but Hb continued to drop, so dosing of darbepoeitin was increased up to 100 and then 150 mcg q 1-2 wks.  Pt required two 1 gm IV Fe loading programs during the summer and early fall to get the Hb to respond up to Hb 8-10. In Sept / Oct 2024, Hb dropped down back into the 8's so darbepoetin dose was ^'d to 200 mcg (every 1-2 wks). Mid Dec 2024 Hb was 9-10 range and in Jan 2025 Hb is around 10. Plan for today -->    Plan: Last dose - 12/03 darbepoetin 200 micrograms; overdue for next dose --> darbe 200 mcg once in HD today.  Darbepoeitin needs to ordered as "once"  or "once in dialysis". The order will default to "SQ at 1800" but that timing is not good because he typically needs to be ready to leave at 5 pm to catch his ride home.  I was present at the procedure, reviewed the HD regimen and made appropriate changes.   Vinson Moselle MD  [facility] 12/19/2023, 11:42 AM

## 2023-12-19 NOTE — ED Triage Notes (Signed)
Pt. Stated, ;Im here for dialysis, This morning my neck was having spasms. Over the weekend my rt. Eye had blurred vision.
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
## 2023-12-19 NOTE — Progress Notes (Signed)
Received patient in bed to unit.  Alert and oriented.  Informed consent signed and in chart.   TX duration:3 hours  Patient tolerated well.  Transported back to the room  Alert, without acute distress.  Hand-off given to patient's nurse.   Access used: Left upper arm graft Access issues: none  Total UF removed: 3L Medication(s) given: Tylenol, Dialudid   12/19/23 1444  Vitals  Temp 98.1 F (36.7 C)  Temp Source Oral  BP (!) 160/81  MAP (mmHg) 105  Pulse Rate 88  ECG Heart Rate 89  Resp 13  Oxygen Therapy  SpO2 99 %  O2 Device Nasal Cannula  O2 Flow Rate (L/min) 3 L/min  During Treatment Monitoring  Duration of HD Treatment -hour(s) 3 hour(s)  HD Safety Checks Performed Yes  Intra-Hemodialysis Comments Tx completed;Tolerated well  Dialysis Fluid Bolus Normal Saline  Bolus Amount (mL) 300 mL  Post Treatment  Dialyzer Clearance Lightly streaked  Liters Processed 72  Fluid Removed (mL) 3000 mL  Tolerated HD Treatment Yes  AVG/AVF Arterial Site Held (minutes) 7 minutes  AVG/AVF Venous Site Held (minutes) 7 minutes  Fistula / Graft Left Upper arm Arteriovenous vein graft  Placement Date/Time: (c) 04/22/23 (c) 1000   Orientation: Left  Access Location: Upper arm  Access Type: Arteriovenous vein graft  Status Deaccessed     Stacie Glaze LPN [facility]

## 2023-12-20 LAB — HEPATITIS B SURFACE ANTIBODY, QUANTITATIVE: Hep B S AB Quant (Post): 33.4 m[IU]/mL

## 2023-12-21 ENCOUNTER — Emergency Department (HOSPITAL_COMMUNITY)
Admission: EM | Admit: 2023-12-21 | Discharge: 2023-12-21 | Discharge status: Against Medical Advice | Payer: 59 | Attending: Emergency Medicine | Admitting: Emergency Medicine

## 2023-12-21 ENCOUNTER — Other Ambulatory Visit: Payer: Self-pay

## 2023-12-21 ENCOUNTER — Other Ambulatory Visit: Payer: Self-pay | Admitting: Nephrology

## 2023-12-21 DIAGNOSIS — I132 Hypertensive heart and chronic kidney disease with heart failure and with stage 5 chronic kidney disease, or end stage renal disease: Secondary | ICD-10-CM | POA: Diagnosis not present

## 2023-12-21 DIAGNOSIS — I509 Heart failure, unspecified: Secondary | ICD-10-CM | POA: Diagnosis not present

## 2023-12-21 DIAGNOSIS — E1122 Type 2 diabetes mellitus with diabetic chronic kidney disease: Secondary | ICD-10-CM | POA: Insufficient documentation

## 2023-12-21 DIAGNOSIS — N186 End stage renal disease: Secondary | ICD-10-CM | POA: Diagnosis not present

## 2023-12-21 DIAGNOSIS — Z992 Dependence on renal dialysis: Secondary | ICD-10-CM | POA: Diagnosis not present

## 2023-12-21 DIAGNOSIS — J449 Chronic obstructive pulmonary disease, unspecified: Secondary | ICD-10-CM | POA: Diagnosis not present

## 2023-12-21 DIAGNOSIS — M542 Cervicalgia: Secondary | ICD-10-CM | POA: Insufficient documentation

## 2023-12-21 LAB — RENAL FUNCTION PANEL
Albumin: 3.7 g/dL (ref 3.5–5.0)
Anion gap: 10 (ref 5–15)
BUN: 79 mg/dL — ABNORMAL HIGH (ref 8–23)
CO2: 27 mmol/L (ref 22–32)
Calcium: 8.7 mg/dL — ABNORMAL LOW (ref 8.9–10.3)
Chloride: 97 mmol/L — ABNORMAL LOW (ref 98–111)
Creatinine, Ser: 6.13 mg/dL — ABNORMAL HIGH (ref 0.61–1.24)
GFR, Estimated: 10 mL/min — ABNORMAL LOW (ref >60)
Glucose, Bld: 309 mg/dL — ABNORMAL HIGH (ref 70–99)
Phosphorus: 4.8 mg/dL — ABNORMAL HIGH (ref 2.5–4.6)
Potassium: 4.6 mmol/L (ref 3.5–5.1)
Sodium: 134 mmol/L — ABNORMAL LOW (ref 135–145)

## 2023-12-21 LAB — CBC
HCT: 32 % — ABNORMAL LOW (ref 39.0–52.0)
Hemoglobin: 10.3 g/dL — ABNORMAL LOW (ref 13.0–17.0)
MCH: 28.4 pg (ref 26.0–34.0)
MCHC: 32.2 g/dL (ref 30.0–36.0)
MCV: 88.2 fL (ref 80.0–100.0)
Platelets: 120 10*3/uL — ABNORMAL LOW (ref 150–400)
RBC: 3.63 MIL/uL — ABNORMAL LOW (ref 4.22–5.81)
RDW: 13.5 % (ref 11.5–15.5)
WBC: 7.3 10*3/uL (ref 4.0–10.5)
nRBC: 0 % (ref 0.0–0.2)

## 2023-12-21 MED ORDER — LIDOCAINE HCL (PF) 1 % IJ SOLN: 5.0000 mL | INTRAMUSCULAR | Status: DC | PRN

## 2023-12-21 MED ORDER — HEPARIN SODIUM (PORCINE) 1000 UNIT/ML IJ SOLN
INTRAMUSCULAR | Status: AC
  Filled 2023-12-21: qty 2

## 2023-12-21 MED ORDER — HYDROMORPHONE HCL 1 MG/ML IJ SOLN
0.5000 mg | Freq: Once | INTRAMUSCULAR | Status: AC
  Administered 2023-12-21: 0.5 mg via INTRAMUSCULAR
  Filled 2023-12-21: qty 1

## 2023-12-21 MED ORDER — LIDOCAINE 5 % EX PTCH
1.0000 | MEDICATED_PATCH | CUTANEOUS | Status: DC
  Administered 2023-12-21: 1 via TRANSDERMAL
  Filled 2023-12-21: qty 1

## 2023-12-21 MED ORDER — ANTICOAGULANT SODIUM CITRATE 4% (200MG/5ML) IV SOLN
5.0000 mL | Status: DC | PRN
Start: 2023-12-21 — End: 2023-12-21
  Filled 2023-12-21: qty 5

## 2023-12-21 MED ORDER — HEPARIN SODIUM (PORCINE) 1000 UNIT/ML DIALYSIS
2000.0000 [IU] | INTRAMUSCULAR | Status: DC | PRN
Start: 2023-12-21 — End: 2023-12-21

## 2023-12-21 MED ORDER — CHLORHEXIDINE GLUCONATE CLOTH 2 % EX PADS: 6.0000 | MEDICATED_PAD | Freq: Every day | CUTANEOUS | Status: DC

## 2023-12-21 MED ORDER — HEPARIN SODIUM (PORCINE) 1000 UNIT/ML DIALYSIS
1000.0000 [IU] | INTRAMUSCULAR | Status: DC | PRN
Start: 2023-12-21 — End: 2023-12-21

## 2023-12-21 MED ORDER — CALCIUM ACETATE (PHOS BINDER) 667 MG PO CAPS: 1334.0000 mg | ORAL_CAPSULE | Freq: Three times a day (TID) | ORAL | Status: DC

## 2023-12-21 MED ORDER — LIDOCAINE-PRILOCAINE 2.5-2.5 % EX CREA
1.0000 | TOPICAL_CREAM | CUTANEOUS | Status: DC | PRN
Start: 2023-12-21 — End: 2023-12-21

## 2023-12-21 MED ORDER — ALTEPLASE 2 MG IJ SOLR
2.0000 mg | Freq: Once | INTRAMUSCULAR | Status: DC | PRN
Start: 2023-12-21 — End: 2023-12-21

## 2023-12-21 MED ORDER — PENTAFLUOROPROP-TETRAFLUOROETH EX AERO
1.0000 | INHALATION_SPRAY | CUTANEOUS | Status: DC | PRN
Start: 2023-12-21 — End: 2023-12-21

## 2023-12-21 NOTE — ED Provider Triage Note (Signed)
Emergency Medicine Provider Triage Evaluation Note  Jeffrey [name] , a 63 y.o. male  was evaluated in triage.  Pt complains of being in dialysis and continued chronic neck pain.  Review of Systems  Positive: Neck pain and pain with arm movement similar to what has been in the past Negative: No fevers, chills, congestion, cough, nausea, vomiting, constipation, diarrhea  Physical Exam  BP (!) 137/53   Pulse 86   Temp 97.6 F (36.4 C) (Oral)   Resp 20   SpO2 97%  Gen:   Awake, no distress   Resp:  Normal effort , lungs clear with no rhonchi or wheezing MSK:   Moves extremities without difficulty  Other:  Neck tenderness similar to prior.  Medical Decision Making  Medically screening exam initiated at 9:05 AM.  Appropriate orders placed.  Jeffrey [name] was informed that the remainder of the evaluation will be completed by another provider, this initial triage assessment does not replace that evaluation, and the importance of remaining in the ED until their evaluation is complete.  Jeffrey [name] is a 63 y.o. male is here for his dialysis.  He reports he was here 2 days ago for his dialysis and had workup including imaging of his head and neck and it was reassuring and unchanged.  He reports he still having soreness there and was requesting a shot of pain medicine while awaiting his dialysis today.  He otherwise reports no infectious symptoms.  He denies any chest pain short of breath or other complaints.  Given his recent reassuring CT imaging, will hold on repeat imaging today.  He will get his screening dialysis labs and we will give him a shot of pain medicine to help with his discomfort today.  I suspect that the environmental changes and cold temperatures have caused more inflammation in his chronic neck pain.  Patient will await dialysis and further assessment in the emergency department.    Jeffrey [name], Canary Brim, MD 12/21/23 [phone]

## 2023-12-21 NOTE — ED Notes (Signed)
Report given to dialysis RN, states he will go to dialysis around noon

## 2023-12-21 NOTE — ED Triage Notes (Signed)
Pt here for dialysis, completed treatment. Pt. Stated, My neck is killing me., I can't even move my head.

## 2023-12-21 NOTE — Progress Notes (Signed)
   12/21/23 1636  Vitals  Temp 98.6 F (37 C)  Pulse Rate 92  Resp 12  BP (!) 130/93  SpO2 98 %  O2 Device Nasal Cannula  Oxygen Therapy  O2 Flow Rate (L/min) 3 L/min  Patient Activity (if Appropriate) In bed  Pulse Oximetry Type Continuous  Oximetry Probe Site Changed No  Post Treatment  Dialyzer Clearance Lightly streaked  Hemodialysis Intake (mL) 0 mL  Liters Processed 72  Fluid Removed (mL) 3000 mL  Tolerated HD Treatment Yes  AVG/AVF Arterial Site Held (minutes) 5 minutes  AVG/AVF Venous Site Held (minutes) 5 minutes  During Treatment Monitoring  Duration of HD Treatment -hour(s) 3 hour(s)   Received patient in bed to unit.  Alert and oriented.  Informed consent signed and in chart.   TX duration: 3 hours  Patient tolerated well.  Transported to ED. Alert, without acute distress.  Hand-off given to patient's nurse.   Access used: Right AVG Access issues: None  Total UF removed: 3L Medication(s) given: Lidocaine patch for neck pain

## 2023-12-21 NOTE — ED Provider Notes (Signed)
Emergency Department Provider Note   I have reviewed the triage vital signs and the nursing notes.   HISTORY  Chief Complaint Vascular Access Problem and Neck Pain   HPI Jeffrey [name] is a 63 y.o. male with past history reviewed below including ESRD presents to the emergency department for his routine dialysis.  He has been compliant with his prior sessions.  He is also complaining of neck pain which is more of a chronic issue for him.  No numbness or weakness.  No injuries.  No fevers.  He was given a shot of Dilaudid in triage with some mild improvement in pain symptoms.  He feels he may benefit from a second dose of this medicine prior to going for dialysis.    Past Medical History:  Diagnosis Date   Altered mental status 05/04/2021   Arthritis    Blood transfusion without reported diagnosis    CHF (congestive heart failure) (HCC)    COPD (chronic obstructive pulmonary disease) (HCC)    Depression    Diabetes mellitus without complication (HCC)    type 2   Dyspnea    ESRD on hemodialysis (HCC)    Tues Thurs Sat   GERD (gastroesophageal reflux disease)    protonix   Heart murmur    as a child, no problems   Hypertension    Myocardial infarction (HCC)    PTSD (post-traumatic stress disorder)    Sepsis (HCC)    Sleep apnea    occasional uses CPAP   Uses powered wheelchair     Review of Systems  Constitutional: No fever/chills Cardiovascular: Denies chest pain. Respiratory: Denies shortness of breath. Gastrointestinal: No abdominal pain.  Musculoskeletal: Positive neck pain.  Skin: Negative for rash. Neurological: Negative for headaches.  ____________________________________________   PHYSICAL EXAM:  VITAL SIGNS: ED Triage Vitals  Encounter Vitals Group     BP 12/21/23 0732 (!) 137/53     Pulse Rate 12/21/23 0732 86     Resp 12/21/23 0732 20     Temp 12/21/23 0732 97.6 F (36.4 C)     Temp Source 12/21/23 0732 Oral     SpO2 12/21/23 0732 97 %    Constitutional: Alert and oriented. Well appearing and in no acute distress. Eyes: Conjunctivae are normal. Head: Atraumatic. Nose: No congestion/rhinnorhea. Mouth/Throat: Mucous membranes are moist. Neck: No stridor.   Cardiovascular: Good peripheral circulation.  Respiratory: Normal respiratory effort.  Gastrointestinal: No distention.  Musculoskeletal: No gross deformities of extremities. Neurologic:  Normal speech and language.  Skin:  Skin is warm, dry and intact. No rash noted.  ____________________________________________   LABS (all labs ordered are listed, but only abnormal results are displayed)  Labs Reviewed  CBC - Abnormal; Notable for the following components:      Result Value   RBC 3.63 (*)    Hemoglobin 10.3 (*)    HCT 32.0 (*)    Platelets 120 (*)    All other components within normal limits  RENAL FUNCTION PANEL - Abnormal; Notable for the following components:   Sodium 134 (*)    Chloride 97 (*)    Glucose, Bld 309 (*)    BUN 79 (*)    Creatinine, Ser 6.13 (*)    Calcium 8.7 (*)    Phosphorus 4.8 (*)    GFR, Estimated 10 (*)    All other components within normal limits    ____________________________________________   PROCEDURES  Procedure(s) performed:   Procedures  None  ____________________________________________  INITIAL IMPRESSION / ASSESSMENT AND PLAN / ED COURSE  Pertinent labs & imaging results that were available during my care of the patient were reviewed by me and considered in my medical decision making (see chart for details).   This patient is Presenting for Evaluation of neck pain, which does require a range of treatment options, and is a complaint that involves a high risk of morbidity and mortality.  The Differential Diagnoses includes but is not exclusive to musculoskeletal back pain, aortic aneurysm or dissection, cervical disc disease, etc.   Critical Interventions-    Medications  Chlorhexidine Gluconate  Cloth 2 % PADS 6 each (has no administration in time range)  pentafluoroprop-tetrafluoroeth (GEBAUERS) aerosol 1 Application (has no administration in time range)  lidocaine (PF) (XYLOCAINE) 1 % injection 5 mL (has no administration in time range)  lidocaine-prilocaine (EMLA) cream 1 Application (has no administration in time range)  heparin injection 1,000 Units (has no administration in time range)  anticoagulant sodium citrate solution 5 mL (has no administration in time range)  alteplase (CATHFLO ACTIVASE) injection 2 mg (has no administration in time range)  heparin injection 2,000 Units (has no administration in time range)  heparin sodium (porcine) 1000 UNIT/ML injection (has no administration in time range)  HYDROmorphone (DILAUDID) injection 0.5 mg (0.5 mg Intramuscular Given 12/21/23 0948)  HYDROmorphone (DILAUDID) injection 0.5 mg (0.5 mg Intramuscular Given 12/21/23 1203)    Reassessment after intervention: pain improved.    Clinical Laboratory Tests Ordered, included basic metabolic shows normal potassium at 4.6.  BUN 79 similar to prior.  No leukocytosis or severe anemia.  Radiologic Tests: Considered neck imaging but this is an ongoing issue for the patient and has been worked up extensively in the past.  No new symptoms.  No red flag signs or symptoms to prompt new neck imaging.  Consult complete with Nephrology. Plan for HD later today.   Medical Decision Making: Summary:  Patient presents emergency department for evaluation of neck pain.  Here for routine dialysis.  Have coordinated with nephrology and they will take him for dialysis around noon today.  Neck pain is chronic as described above.  No new imaging today.  Plan for additional pain medication prior to HD session.   Reevaluation with update and discussion with patient. Ready for HD. Will reassess after HD complete.   Patient's presentation is most consistent with exacerbation of chronic illness.   Disposition:  transfer to HD unit  ____________________________________________  FINAL CLINICAL IMPRESSION(S) / ED DIAGNOSES  Final diagnoses:  ESRD (end stage renal disease) (HCC)  Neck pain    Note:  This document was prepared using Dragon voice recognition software and may include unintentional dictation errors.  Alona Bene, MD, [facility] Emergency Medicine    Daegon Deiss, Arlyss Repress, MD 12/21/23 [phone]

## 2023-12-21 NOTE — ED Notes (Signed)
Pt left prior to EDP reeval

## 2023-12-23 ENCOUNTER — Emergency Department (HOSPITAL_COMMUNITY)
Admission: EM | Admit: 2023-12-23 | Discharge: 2023-12-23 | Disposition: A | Discharge status: Home / Self Care | Payer: 59 | Attending: Emergency Medicine | Admitting: Emergency Medicine

## 2023-12-23 DIAGNOSIS — Z992 Dependence on renal dialysis: Secondary | ICD-10-CM | POA: Insufficient documentation

## 2023-12-23 DIAGNOSIS — Z7901 Long term (current) use of anticoagulants: Secondary | ICD-10-CM | POA: Diagnosis not present

## 2023-12-23 DIAGNOSIS — N186 End stage renal disease: Secondary | ICD-10-CM | POA: Insufficient documentation

## 2023-12-23 DIAGNOSIS — Z7982 Long term (current) use of aspirin: Secondary | ICD-10-CM | POA: Insufficient documentation

## 2023-12-23 DIAGNOSIS — Z794 Long term (current) use of insulin: Secondary | ICD-10-CM | POA: Insufficient documentation

## 2023-12-23 LAB — RENAL FUNCTION PANEL
Albumin: 3.6 g/dL (ref 3.5–5.0)
Anion gap: 15 (ref 5–15)
BUN: 74 mg/dL — ABNORMAL HIGH (ref 8–23)
CO2: 25 mmol/L (ref 22–32)
Calcium: 8.8 mg/dL — ABNORMAL LOW (ref 8.9–10.3)
Chloride: 92 mmol/L — ABNORMAL LOW (ref 98–111)
Creatinine, Ser: 6.04 mg/dL — ABNORMAL HIGH (ref 0.61–1.24)
GFR, Estimated: 10 mL/min — ABNORMAL LOW (ref >60)
Glucose, Bld: 377 mg/dL — ABNORMAL HIGH (ref 70–99)
Phosphorus: 4.6 mg/dL (ref 2.5–4.6)
Potassium: 4.5 mmol/L (ref 3.5–5.1)
Sodium: 132 mmol/L — ABNORMAL LOW (ref 135–145)

## 2023-12-23 LAB — CBC WITH DIFFERENTIAL/PLATELET
Abs Immature Granulocytes: 0.07 10*3/uL (ref 0.00–0.07)
Basophils Absolute: 0 10*3/uL (ref 0.0–0.1)
Basophils Relative: 0 %
Eosinophils Absolute: 0.2 10*3/uL (ref 0.0–0.5)
Eosinophils Relative: 2 %
HCT: 30.7 % — ABNORMAL LOW (ref 39.0–52.0)
Hemoglobin: 9.9 g/dL — ABNORMAL LOW (ref 13.0–17.0)
Immature Granulocytes: 1 %
Lymphocytes Relative: 8 %
Lymphs Abs: 0.6 10*3/uL — ABNORMAL LOW (ref 0.7–4.0)
MCH: 28.6 pg (ref 26.0–34.0)
MCHC: 32.2 g/dL (ref 30.0–36.0)
MCV: 88.7 fL (ref 80.0–100.0)
Monocytes Absolute: 0.5 10*3/uL (ref 0.1–1.0)
Monocytes Relative: 7 %
Neutro Abs: 6.5 10*3/uL (ref 1.7–7.7)
Neutrophils Relative %: 82 %
Platelets: 123 10*3/uL — ABNORMAL LOW (ref 150–400)
RBC: 3.46 MIL/uL — ABNORMAL LOW (ref 4.22–5.81)
RDW: 13.5 % (ref 11.5–15.5)
WBC: 7.9 10*3/uL (ref 4.0–10.5)
nRBC: 0 % (ref 0.0–0.2)

## 2023-12-23 MED ORDER — HEPARIN SODIUM (PORCINE) 1000 UNIT/ML IJ SOLN
INTRAMUSCULAR | Status: AC
  Filled 2023-12-23: qty 2

## 2023-12-23 MED ORDER — CHLORHEXIDINE GLUCONATE CLOTH 2 % EX PADS: 6.0000 | MEDICATED_PAD | Freq: Every day | CUTANEOUS | Status: DC

## 2023-12-23 MED ORDER — HEPARIN SODIUM (PORCINE) 1000 UNIT/ML IJ SOLN
2000.0000 [IU] | Freq: Once | INTRAMUSCULAR | Status: AC
  Administered 2023-12-23: 2000 [IU]

## 2023-12-23 MED ORDER — HYDROMORPHONE HCL 1 MG/ML IJ SOLN
1.0000 mg | Freq: Once | INTRAMUSCULAR | Status: DC
  Filled 2023-12-23: qty 1

## 2023-12-23 MED ORDER — HYDROMORPHONE HCL 1 MG/ML IJ SOLN
0.5000 mg | Freq: Once | INTRAMUSCULAR | Status: AC
  Administered 2023-12-23: 0.5 mg via INTRAVENOUS
  Filled 2023-12-23: qty 1

## 2023-12-23 MED ORDER — HYDROMORPHONE HCL 1 MG/ML IJ SOLN
0.5000 mg | INTRAMUSCULAR | Status: DC | PRN
  Administered 2023-12-23: 0.5 mg via INTRAVENOUS
  Filled 2023-12-23: qty 0.5

## 2023-12-23 MED ORDER — HYDROMORPHONE HCL 1 MG/ML IJ SOLN
0.5000 mg | Freq: Once | INTRAMUSCULAR | Status: AC
  Administered 2023-12-23: 0.5 mg via INTRAVENOUS

## 2023-12-23 NOTE — ED Triage Notes (Signed)
Pt here for dialysis, had complete treatment on Thursday, Patient states chronic neck pain.

## 2023-12-23 NOTE — Progress Notes (Signed)
   12/23/23 1615  Vitals  Temp 98.3 F (36.8 C)  Pulse Rate 94  Resp 11  BP (!) 103/48  SpO2 99 %  O2 Device Nasal Cannula  Type of Weight Post-Dialysis  Oxygen Therapy  O2 Flow Rate (L/min) 3 L/min  Patient Activity (if Appropriate) In bed  Pulse Oximetry Type Continuous  Oximetry Probe Site Changed No  Post Treatment  Dialyzer Clearance Lightly streaked  Hemodialysis Intake (mL) 0 mL  Tolerated HD Treatment Yes  AVG/AVF Arterial Site Held (minutes) 7 minutes  AVG/AVF Venous Site Held (minutes) 7 minutes  During Treatment Monitoring  Duration of HD Treatment -hour(s) 3 hour(s)   Received patient in bed to unit.  Alert and oriented.  Informed consent signed and in chart.   TX duration:3.0  Patient tolerated well.  Transported back to the room  Alert, without acute distress.  Hand-off given to patient's nurse.   Access used: L AVF Access issues: None  Total UF removed: Medication(s) given: See MAR  Laqueta Due, RN [facility]

## 2023-12-23 NOTE — ED Provider Notes (Signed)
[place] EMERGENCY DEPARTMENT AT [facility] Provider Note   CSN: [id number] Arrival date & time: 12/23/23  4696     History  Chief Complaint  Patient presents with   Vascular Access Problem    Patient is here for dialysis     Jeffrey [name] is a 63 y.o. male.  The history is provided by the patient and medical records. No language interpreter was used.  Illness Location:  Here for dialysis and has his neck pain Severity:  Severe Onset quality:  Gradual Timing:  Unable to specify Progression:  Unable to specify Chronicity:  Chronic Associated symptoms: no abdominal pain, no chest pain, no congestion, no cough, no diarrhea, no fatigue, no fever, no headaches, no loss of consciousness, no shortness of breath and no sore throat        Home Medications Prior to Admission medications   Medication Sig Start Date End Date Taking? Authorizing Provider  acetaminophen (TYLENOL) 325 MG tablet Take 2 tablets (650 mg total) by mouth every 6 (six) hours. 07/23/23   Lockie Mola, MD  amLODipine (NORVASC) 10 MG tablet Take 10 mg by mouth every morning. 04/17/20   [provider]  ASPIRIN LOW DOSE 81 MG EC tablet Take 81 mg by mouth in the morning. 02/22/21   [provider]  atorvastatin (LIPITOR) 40 MG tablet Take 40 mg by mouth in the morning. 02/09/21   [provider]  calcium acetate (PHOSLO) 667 MG capsule Take 2 capsules (1,334 mg total) by mouth with breakfast, with lunch, and with evening meal. 12/21/23   Ejigiri, Megan Mans, PA-C  carvedilol (COREG) 12.5 MG tablet Take 12.5 mg by mouth 2 (two) times daily with a meal. 04/17/20   [provider]  clopidogrel (PLAVIX) 75 MG tablet Take 1 tablet (75 mg total) by mouth daily. 11/02/22   Rhyne, Ames Coupe, PA-C  diazepam (VALIUM) 5 MG tablet Take 5 mg by mouth 2 (two) times daily as needed for muscle spasms. 12/09/21   [provider]  Fluticasone-Umeclidin-Vilant (TRELEGY ELLIPTA IN)  Inhale 1 puff into the lungs daily.    [provider]  insulin lispro (HUMALOG KWIKPEN) 100 UNIT/ML KwikPen Inject 0-9 Units into the skin 3 (three) times daily with meals. CBG < 70: treat low blood sugar CBG 70 - 120: 0 units  CBG 121 - 150: 1 unit  CBG 151 - 200: 2 units  CBG 201 - 250: 3 units  CBG 251 - 300: 5 units  CBG 301 - 350: 7 units  CBG 351 - 400: 9 units  CBG > 400: call MD 09/27/23   Zigmund Daniel., MD  insulin regular human CONCENTRATED (HUMULIN R U-500 KWIKPEN) 500 UNIT/ML KwikPen Inject 55 Units into the skin 2 (two) times daily with a meal. Follow up with your PCP and [facility] for additional adjustments 09/27/23 10/27/23  Zigmund Daniel., MD  iron polysaccharides (NIFEREX) 150 MG capsule Take 1 capsule (150 mg total) by mouth daily. 09/26/23   Meredeth Ide, MD  nortriptyline (PAMELOR) 10 MG capsule Take 20 mg by mouth at bedtime.    [provider]  pantoprazole (PROTONIX) 40 MG tablet Take 40 mg by mouth daily before breakfast. 02/09/21   [provider]  pregabalin (LYRICA) 75 MG capsule Take 1 capsule (75 mg total) by mouth See admin instructions. Daily.  Give after dialysis on dialysis days. Patient taking differently: Take 75 mg by mouth daily. 01/21/23  [name], MD  sertraline (ZOLOFT) 100 MG tablet Take 100 mg by mouth in the morning. 02/22/21   [provider]  tamsulosin (FLOMAX) 0.4 MG CAPS capsule Take 1 capsule (0.4 mg total) by mouth daily. 11/10/21   [name], MD  torsemide (DEMADEX) 20 MG tablet Take 120 mg by mouth 2 (two) times daily.    [provider]  traZODone (DESYREL) 150 MG tablet Take 150 mg by mouth at bedtime. 12/09/21   [provider]  Vitamin D, Ergocalciferol, (DRISDOL) 1.25 MG (50000 UNIT) CAPS capsule Take 1 capsule (50,000 Units total) by mouth every 7 (seven) days. 10/02/23   Meredeth Ide, MD      Allergies    Actos [pioglitazone], Dexmedetomidine, Ibuprofen,  Tomato, and Wellbutrin [bupropion]    Review of Systems   Review of Systems  Constitutional:  Negative for chills, fatigue and fever.  HENT:  Negative for congestion and sore throat.   Respiratory:  Negative for cough, chest tightness and shortness of breath.   Cardiovascular:  Negative for chest pain.  Gastrointestinal:  Negative for abdominal pain and diarrhea.  Genitourinary:  Negative for flank pain.  Musculoskeletal:  Positive for neck pain. Negative for back pain.  Neurological:  Negative for loss of consciousness and headaches.  Psychiatric/Behavioral:  Negative for agitation.   All other systems reviewed and are negative.   Physical Exam Updated Vital Signs BP (!) 145/50 (BP Location: Right Wrist)   Pulse 88   Temp 98.4 F (36.9 C) (Oral)   Resp 17   Ht 5\' 5"  (1.651 m)   Wt 135.2 kg   SpO2 100%   BMI 49.60 kg/m  Physical Exam Vitals and nursing note reviewed.  Constitutional:      General: He is not in acute distress.    Appearance: He is well-developed. He is not ill-appearing, toxic-appearing or diaphoretic.  HENT:     Head: Normocephalic and atraumatic.     Nose: No congestion or rhinorrhea.     Mouth/Throat:     Pharynx: No oropharyngeal exudate or posterior oropharyngeal erythema.  Eyes:     Conjunctiva/sclera: Conjunctivae normal.     Pupils: Pupils are equal, round, and reactive to light.  Cardiovascular:     Rate and Rhythm: Normal rate and regular rhythm.     Heart sounds: No murmur heard. Pulmonary:     Effort: Pulmonary effort is normal. No respiratory distress.     Breath sounds: Normal breath sounds. No wheezing, rhonchi or rales.  Chest:     Chest wall: No tenderness.  Abdominal:     Palpations: Abdomen is soft.     Tenderness: There is no abdominal tenderness. There is no guarding or rebound.  Skin:    General: Skin is warm and dry.     Capillary Refill: Capillary refill takes less than 2 seconds.  Neurological:     Mental Status: He is  alert.  Psychiatric:        Mood and Affect: Mood normal.     ED Results / Procedures / Treatments   Labs (all labs ordered are listed, but only abnormal results are displayed) Labs Reviewed  CBC WITH DIFFERENTIAL/PLATELET - Abnormal; Notable for the following components:      Result Value   RBC 3.46 (*)    Hemoglobin 9.9 (*)    HCT 30.7 (*)    Platelets 123 (*)    Lymphs Abs 0.6 (*)    All other components within normal limits  RENAL FUNCTION PANEL - Abnormal; Notable for the following components:   Sodium 132 (*)    Chloride 92 (*)    Glucose, Bld 377 (*)    BUN 74 (*)    Creatinine, Ser 6.04 (*)    Calcium 8.8 (*)    GFR, Estimated 10 (*)    All other components within normal limits    EKG None  Radiology No results found.  Procedures Procedures    Medications Ordered in ED Medications  Chlorhexidine Gluconate Cloth 2 % PADS 6 each (has no administration in time range)  heparin sodium (porcine) 1000 UNIT/ML injection (has no administration in time range)  HYDROmorphone (DILAUDID) injection 0.5 mg (0.5 mg Intravenous Given 12/23/23 1340)  HYDROmorphone (DILAUDID) injection 0.5 mg (0.5 mg Intravenous Given 12/23/23 0904)  HYDROmorphone (DILAUDID) injection 0.5 mg (0.5 mg Intravenous Given 12/23/23 1144)  heparin sodium (porcine) injection 2,000 Units (2,000 Units Intracatheter Given 12/23/23 1327)    ED Course/ Medical Decision Making/ A&P                                 Medical Decision Making Amount and/or Complexity of Data Reviewed Labs: ordered.  Risk Prescription drug management.    YUVAN MEDINGER is a 63 y.o. male with a past medical history significant for ESRD on dialysis TTS and chronic neck pain status post previous surgery who presents for his dialysis and continued neck discomfort.  I saw patient 2 days ago when he had similar complaint and was here for dialysis and neck pain.  He says he has had no changes and specifically denies any fevers,  chills, congestion, cough, nausea, vomiting, constipation or diarrhea.  Denies new trauma.  Reports he is having severe neck pain that he has been dealing with and is having difficulty keeping pain under control at home.  When he was here several days ago he was given Dilaudid that seem to help intramuscular.  On exam, lungs clear and chest nontender.  Abdomen nontender.  Patient still having neck pain but otherwise no new complaints.  Patient resting on his home oxygen listening to music.  He will get his screening predialysis lab work and I will give him a shot of pain medicine.  Will let dialysis know patient is here and anticipate dialysis sometime today.  Anticipate discharge after his dialysis treatment.         Final Clinical Impression(s) / ED Diagnoses Final diagnoses:  ESRD (end stage renal disease) (HCC)   Clinical Impression: 1. ESRD (end stage renal disease) (HCC)     Disposition: To dialysis  This note was prepared with assistance of Dragon voice recognition software. Occasional wrong-word or sound-a-like substitutions may have occurred due to the inherent limitations of voice recognition software.     Arath Kaigler, Canary Brim, MD 12/23/23 [phone]

## 2023-12-26 ENCOUNTER — Emergency Department (HOSPITAL_COMMUNITY)
Admission: EM | Admit: 2023-12-26 | Discharge: 2023-12-27 | Discharge status: Against Medical Advice | Payer: 59 | Attending: Emergency Medicine | Admitting: Emergency Medicine

## 2023-12-26 ENCOUNTER — Other Ambulatory Visit: Payer: Self-pay

## 2023-12-26 ENCOUNTER — Encounter (HOSPITAL_COMMUNITY): Payer: Self-pay

## 2023-12-26 DIAGNOSIS — Z992 Dependence on renal dialysis: Secondary | ICD-10-CM | POA: Diagnosis not present

## 2023-12-26 DIAGNOSIS — Z5321 Procedure and treatment not carried out due to patient leaving prior to being seen by health care provider: Secondary | ICD-10-CM | POA: Insufficient documentation

## 2023-12-26 DIAGNOSIS — G8929 Other chronic pain: Secondary | ICD-10-CM | POA: Diagnosis not present

## 2023-12-26 DIAGNOSIS — M542 Cervicalgia: Secondary | ICD-10-CM | POA: Insufficient documentation

## 2023-12-26 LAB — RENAL FUNCTION PANEL
Albumin: 3.7 g/dL (ref 3.5–5.0)
Anion gap: 17 — ABNORMAL HIGH (ref 5–15)
BUN: 86 mg/dL — ABNORMAL HIGH (ref 8–23)
CO2: 23 mmol/L (ref 22–32)
Calcium: 8.6 mg/dL — ABNORMAL LOW (ref 8.9–10.3)
Chloride: 91 mmol/L — ABNORMAL LOW (ref 98–111)
Creatinine, Ser: 6.83 mg/dL — ABNORMAL HIGH (ref 0.61–1.24)
GFR, Estimated: 8 mL/min — ABNORMAL LOW (ref >60)
Glucose, Bld: 434 mg/dL — ABNORMAL HIGH (ref 70–99)
Phosphorus: 5.8 mg/dL — ABNORMAL HIGH (ref 2.5–4.6)
Potassium: 4.2 mmol/L (ref 3.5–5.1)
Sodium: 131 mmol/L — ABNORMAL LOW (ref 135–145)

## 2023-12-26 LAB — CBC
HCT: 30.8 % — ABNORMAL LOW (ref 39.0–52.0)
Hemoglobin: 10 g/dL — ABNORMAL LOW (ref 13.0–17.0)
MCH: 28.6 pg (ref 26.0–34.0)
MCHC: 32.5 g/dL (ref 30.0–36.0)
MCV: 88 fL (ref 80.0–100.0)
Platelets: 114 10*3/uL — ABNORMAL LOW (ref 150–400)
RBC: 3.5 MIL/uL — ABNORMAL LOW (ref 4.22–5.81)
RDW: 13.5 % (ref 11.5–15.5)
WBC: 6 10*3/uL (ref 4.0–10.5)
nRBC: 0 % (ref 0.0–0.2)

## 2023-12-26 MED ORDER — HEPARIN SODIUM (PORCINE) 1000 UNIT/ML DIALYSIS: 2000.0000 [IU] | Freq: Once | INTRAMUSCULAR | Status: DC

## 2023-12-26 MED ORDER — HEPARIN SODIUM (PORCINE) 1000 UNIT/ML IJ SOLN
INTRAMUSCULAR | Status: AC
  Filled 2023-12-26: qty 2

## 2023-12-26 MED ORDER — MORPHINE SULFATE ER 15 MG PO TBCR: 15.0000 mg | EXTENDED_RELEASE_TABLET | Freq: Once | ORAL | Status: DC

## 2023-12-26 MED ORDER — CHLORHEXIDINE GLUCONATE CLOTH 2 % EX PADS: 6.0000 | MEDICATED_PAD | Freq: Every day | CUTANEOUS | Status: DC

## 2023-12-26 MED ORDER — ACETAMINOPHEN 325 MG PO TABS
650.0000 mg | ORAL_TABLET | Freq: Four times a day (QID) | ORAL | Status: DC | PRN
  Administered 2023-12-26: 650 mg via ORAL
  Filled 2023-12-26: qty 2

## 2023-12-26 MED ORDER — MORPHINE SULFATE ER 15 MG PO TBCR
15.0000 mg | EXTENDED_RELEASE_TABLET | Freq: Once | ORAL | Status: DC
  Filled 2023-12-26: qty 1

## 2023-12-26 MED ORDER — MORPHINE SULFATE 15 MG PO TABS: 15.0000 mg | ORAL_TABLET | Freq: Once | ORAL | Status: DC

## 2023-12-26 NOTE — ED Triage Notes (Signed)
Pt arrives via POV. Pt reports he is here for dialysis. He also reports his chronic neck pain is worsening. Pt AxOx4.

## 2023-12-26 NOTE — Progress Notes (Addendum)
   12/26/23 1617  Vitals  Temp 98.2 F (36.8 C)  Pulse Rate 85  Resp 17  BP (!) 144/103  SpO2 100 %  O2 Device Nasal Cannula  Oxygen Therapy  O2 Flow Rate (L/min) 3 L/min  Patient Activity (if Appropriate) In bed  Pulse Oximetry Type Continuous  Oximetry Probe Site Changed No  Post Treatment  Dialyzer Clearance Lightly streaked  Hemodialysis Intake (mL) 0 mL  Liters Processed 48.1  Fluid Removed (mL) 1000 mL  Tolerated HD Treatment Yes  AVG/AVF Arterial Site Held (minutes) 5 minutes  AVG/AVF Venous Site Held (minutes) 5 minutes  Duration of HD Treatment -hour(s) 2 hour(s)   Received patient in bed to unit.  Alert and oriented.  Informed consent signed and in chart.   TX duration: 2 hours. Patient ended treatment 1 hour and 15 minutes early. AMA form signed.  Patient tolerated well.  Transported back to the ED for discharge Alert, without acute distress.  Hand-off given to patient's nurse.   Access used: Left AVG Access issues: None  Total UF removed: 1L Medication(s) given: Tylenol

## 2023-12-28 ENCOUNTER — Emergency Department (HOSPITAL_COMMUNITY)
Admission: EM | Admit: 2023-12-28 | Discharge: 2023-12-28 | Disposition: A | Discharge status: Home / Self Care | Payer: 59

## 2023-12-28 DIAGNOSIS — M542 Cervicalgia: Secondary | ICD-10-CM | POA: Insufficient documentation

## 2023-12-28 DIAGNOSIS — Z992 Dependence on renal dialysis: Secondary | ICD-10-CM | POA: Diagnosis not present

## 2023-12-28 DIAGNOSIS — Z794 Long term (current) use of insulin: Secondary | ICD-10-CM | POA: Insufficient documentation

## 2023-12-28 DIAGNOSIS — Z7982 Long term (current) use of aspirin: Secondary | ICD-10-CM | POA: Diagnosis not present

## 2023-12-28 DIAGNOSIS — N186 End stage renal disease: Secondary | ICD-10-CM | POA: Insufficient documentation

## 2023-12-28 DIAGNOSIS — G8929 Other chronic pain: Secondary | ICD-10-CM | POA: Insufficient documentation

## 2023-12-28 LAB — CBC
HCT: 28.9 % — ABNORMAL LOW (ref 39.0–52.0)
Hemoglobin: 9.4 g/dL — ABNORMAL LOW (ref 13.0–17.0)
MCH: 28.3 pg (ref 26.0–34.0)
MCHC: 32.5 g/dL (ref 30.0–36.0)
MCV: 87 fL (ref 80.0–100.0)
Platelets: 120 10*3/uL — ABNORMAL LOW (ref 150–400)
RBC: 3.32 MIL/uL — ABNORMAL LOW (ref 4.22–5.81)
RDW: 13.5 % (ref 11.5–15.5)
WBC: 6.9 10*3/uL (ref 4.0–10.5)
nRBC: 0 % (ref 0.0–0.2)

## 2023-12-28 LAB — RENAL FUNCTION PANEL
Albumin: 3.5 g/dL (ref 3.5–5.0)
Anion gap: 16 — ABNORMAL HIGH (ref 5–15)
BUN: 87 mg/dL — ABNORMAL HIGH (ref 8–23)
CO2: 25 mmol/L (ref 22–32)
Calcium: 8.4 mg/dL — ABNORMAL LOW (ref 8.9–10.3)
Chloride: 96 mmol/L — ABNORMAL LOW (ref 98–111)
Creatinine, Ser: 6.1 mg/dL — ABNORMAL HIGH (ref 0.61–1.24)
GFR, Estimated: 10 mL/min — ABNORMAL LOW (ref >60)
Glucose, Bld: 263 mg/dL — ABNORMAL HIGH (ref 70–99)
Phosphorus: 4.8 mg/dL — ABNORMAL HIGH (ref 2.5–4.6)
Potassium: 4 mmol/L (ref 3.5–5.1)
Sodium: 137 mmol/L (ref 135–145)

## 2023-12-28 MED ORDER — HEPARIN SODIUM (PORCINE) 1000 UNIT/ML DIALYSIS
2000.0000 [IU] | INTRAMUSCULAR | Status: DC | PRN
  Administered 2023-12-28: 2000 [IU] via INTRAVENOUS_CENTRAL

## 2023-12-28 MED ORDER — CHLORHEXIDINE GLUCONATE CLOTH 2 % EX PADS
6.0000 | MEDICATED_PAD | Freq: Every day | CUTANEOUS | Status: DC
Start: 2023-12-28 — End: 2023-12-28

## 2023-12-28 MED ORDER — HYDROMORPHONE HCL 1 MG/ML IJ SOLN
1.0000 mg | Freq: Once | INTRAMUSCULAR | Status: AC
  Administered 2023-12-28: 1 mg via INTRAMUSCULAR
  Filled 2023-12-28: qty 1

## 2023-12-28 NOTE — ED Provider Notes (Signed)
[place] EMERGENCY DEPARTMENT AT [facility] Provider Note   CSN: [id number] Arrival date & time: 12/28/23  0800     History  Chief Complaint  Patient presents with   Vascular Access Problem   Neck Pain    Jeffrey [name] is a 63 y.o. male.  63 year old male male with past medical history of end-stage renal disease on dialysis with last dialysis on Tuesday presenting to the emergency department today requesting dialysis.  The patient does have some chronic neck pain that is no change from his baseline.  Denies any focal weakness, numbness, or tingling.  He denies any bowel or bladder dysfunction and has not had any saddle anesthesia.   Neck Pain      Home Medications Prior to Admission medications   Medication Sig Start Date End Date Taking? Authorizing Provider  acetaminophen (TYLENOL) 325 MG tablet Take 2 tablets (650 mg total) by mouth every 6 (six) hours. 07/23/23   Lockie Mola, MD  amLODipine (NORVASC) 10 MG tablet Take 10 mg by mouth every morning. 04/17/20   [provider]  ASPIRIN LOW DOSE 81 MG EC tablet Take 81 mg by mouth in the morning. 02/22/21   [provider]  atorvastatin (LIPITOR) 40 MG tablet Take 40 mg by mouth in the morning. 02/09/21   [provider]  calcium acetate (PHOSLO) 667 MG capsule Take 2 capsules (1,334 mg total) by mouth with breakfast, with lunch, and with evening meal. 12/21/23   Ejigiri, Megan Mans, PA-C  carvedilol (COREG) 12.5 MG tablet Take 12.5 mg by mouth 2 (two) times daily with a meal. 04/17/20   [provider]  clopidogrel (PLAVIX) 75 MG tablet Take 1 tablet (75 mg total) by mouth daily. 11/02/22   Rhyne, Ames Coupe, PA-C  diazepam (VALIUM) 5 MG tablet Take 5 mg by mouth 2 (two) times daily as needed for muscle spasms. 12/09/21   [provider]  Fluticasone-Umeclidin-Vilant (TRELEGY ELLIPTA IN) Inhale 1 puff into the lungs daily.    [provider]  insulin lispro  (HUMALOG KWIKPEN) 100 UNIT/ML KwikPen Inject 0-9 Units into the skin 3 (three) times daily with meals. CBG < 70: treat low blood sugar CBG 70 - 120: 0 units  CBG 121 - 150: 1 unit  CBG 151 - 200: 2 units  CBG 201 - 250: 3 units  CBG 251 - 300: 5 units  CBG 301 - 350: 7 units  CBG 351 - 400: 9 units  CBG > 400: call MD 09/27/23   Zigmund Daniel., MD  insulin regular human CONCENTRATED (HUMULIN R U-500 KWIKPEN) 500 UNIT/ML KwikPen Inject 55 Units into the skin 2 (two) times daily with a meal. Follow up with your PCP and [facility] for additional adjustments 09/27/23 10/27/23  Zigmund Daniel., MD  iron polysaccharides (NIFEREX) 150 MG capsule Take 1 capsule (150 mg total) by mouth daily. 09/26/23   Meredeth Ide, MD  nortriptyline (PAMELOR) 10 MG capsule Take 20 mg by mouth at bedtime.    [provider]  pantoprazole (PROTONIX) 40 MG tablet Take 40 mg by mouth daily before breakfast. 02/09/21   [provider]  pregabalin (LYRICA) 75 MG capsule Take 1 capsule (75 mg total) by mouth See admin instructions. Daily.  Give after dialysis on dialysis days. Patient taking differently: Take 75 mg by mouth daily. 01/21/23   [name], MD  sertraline (ZOLOFT) 100 MG tablet Take 100 mg by mouth in the morning.  02/22/21   [provider]  tamsulosin (FLOMAX) 0.4 MG CAPS capsule Take 1 capsule (0.4 mg total) by mouth daily. 11/10/21   [name], MD  torsemide (DEMADEX) 20 MG tablet Take 120 mg by mouth 2 (two) times daily.    [provider]  traZODone (DESYREL) 150 MG tablet Take 150 mg by mouth at bedtime. 12/09/21   [provider]  Vitamin D, Ergocalciferol, (DRISDOL) 1.25 MG (50000 UNIT) CAPS capsule Take 1 capsule (50,000 Units total) by mouth every 7 (seven) days. 10/02/23   Meredeth Ide, MD      Allergies    Actos [pioglitazone], Dexmedetomidine, Ibuprofen, Tomato, and Wellbutrin [bupropion]    Review of Systems   Review of Systems   Musculoskeletal:  Positive for neck pain.  All other systems reviewed and are negative.   Physical Exam Updated Vital Signs BP (!) 107/39   Pulse 85   Temp 97.8 F (36.6 C)   Resp 18   SpO2 100%  Physical Exam Vitals and nursing note reviewed.   Gen: NAD, chronically ill appearing Eyes: PERRL, EOMI HEENT: no oropharyngeal swelling Neck: trachea midline Resp: clear to auscultation bilaterally Card: RRR, no murmurs, rubs, or gallops Abd: nontender, nondistended Extremities: Left leg is surgically absent Vascular: 2+ radial pulses bilaterally, 2+ DP pulses bilaterally Neuro: No focal deficits Skin: no rashes Psyc: acting appropriately   ED Results / Procedures / Treatments   Labs (all labs ordered are listed, but only abnormal results are displayed) Labs Reviewed  RENAL FUNCTION PANEL - Abnormal; Notable for the following components:      Result Value   Chloride 96 (*)    Glucose, Bld 263 (*)    BUN 87 (*)    Creatinine, Ser 6.10 (*)    Calcium 8.4 (*)    Phosphorus 4.8 (*)    GFR, Estimated 10 (*)    Anion gap 16 (*)    All other components within normal limits  CBC - Abnormal; Notable for the following components:   RBC 3.32 (*)    Hemoglobin 9.4 (*)    HCT 28.9 (*)    Platelets 120 (*)    All other components within normal limits  HEPATITIS B SURFACE ANTIGEN  HEPATITIS B SURFACE ANTIBODY, QUANTITATIVE    EKG None  Radiology No results found.  Procedures Procedures    Medications Ordered in ED Medications  Chlorhexidine Gluconate Cloth 2 % PADS 6 each (has no administration in time range)  heparin injection 2,000 Units (2,000 Units Dialysis Given 12/28/23 1400)  HYDROmorphone (DILAUDID) injection 1 mg (1 mg Intramuscular Given 12/28/23 1140)    ED Course/ Medical Decision Making/ A&P                                 Medical Decision Making 63 year old male with past medical history of end-stage renal disease presented to the emergency  department today for dialysis.  I will touch base with nephrology.  Will give him IM medications for his chronic pain.  The plan is for discharge after dialysis if he remains stable.  Risk Prescription drug management.           Final Clinical Impression(s) / ED Diagnoses Final diagnoses:  End stage renal disease (HCC)    Rx / DC Orders ED Discharge Orders     None         Durwin Glaze, MD 12/28/23 1510

## 2023-12-28 NOTE — ED Triage Notes (Signed)
Pt. Stated, here for dialysis. On Tuesday did 2 hours of treatment. Still having neck pain. Had 2 Tylenol at 0400

## 2023-12-28 NOTE — Progress Notes (Signed)
   12/28/23 1554  Vitals  Pulse Rate 85  BP (!) 155/75  SpO2 100 %  O2 Device Nasal Cannula  Oxygen Therapy  O2 Flow Rate (L/min) 3 L/min  Pulse Oximetry Type Continuous  Post Treatment  Dialyzer Clearance Heavily streaked  Liters Processed 68.3  Fluid Removed (mL) 2000 mL  Tolerated HD Treatment Yes  AVG/AVF Arterial Site Held (minutes) 10 minutes  AVG/AVF Venous Site Held (minutes) 10 minutes   Received patient in bed to unit.  Alert and oriented.  Informed consent signed and in chart.   TX duration:3.25  Patient tolerated well.  Transported back to the room  Alert, without acute distress.  Hand-off given to patient's nurse.   Access used: LAVF Access issues: None  Total UF removed: Medication(s) given: See MAR    Laqueta Due, RN [facility]

## 2023-12-28 NOTE — ED Notes (Signed)
Patient immediately left after returning to the ED from dialysis.

## 2023-12-30 ENCOUNTER — Emergency Department (HOSPITAL_COMMUNITY)
Admission: EM | Admit: 2023-12-30 | Discharge: 2023-12-30 | Discharge status: Against Medical Advice | Payer: 59 | Attending: Emergency Medicine | Admitting: Emergency Medicine

## 2023-12-30 ENCOUNTER — Other Ambulatory Visit: Payer: Self-pay

## 2023-12-30 ENCOUNTER — Encounter (HOSPITAL_COMMUNITY): Payer: Self-pay

## 2023-12-30 DIAGNOSIS — Z794 Long term (current) use of insulin: Secondary | ICD-10-CM | POA: Insufficient documentation

## 2023-12-30 DIAGNOSIS — N186 End stage renal disease: Secondary | ICD-10-CM | POA: Diagnosis present

## 2023-12-30 DIAGNOSIS — Z7901 Long term (current) use of anticoagulants: Secondary | ICD-10-CM | POA: Insufficient documentation

## 2023-12-30 DIAGNOSIS — Z992 Dependence on renal dialysis: Secondary | ICD-10-CM | POA: Insufficient documentation

## 2023-12-30 DIAGNOSIS — Z7982 Long term (current) use of aspirin: Secondary | ICD-10-CM | POA: Diagnosis not present

## 2023-12-30 LAB — CBC
HCT: 28.3 % — ABNORMAL LOW (ref 39.0–52.0)
Hemoglobin: 9.1 g/dL — ABNORMAL LOW (ref 13.0–17.0)
MCH: 28 pg (ref 26.0–34.0)
MCHC: 32.2 g/dL (ref 30.0–36.0)
MCV: 87.1 fL (ref 80.0–100.0)
Platelets: 115 10*3/uL — ABNORMAL LOW (ref 150–400)
RBC: 3.25 MIL/uL — ABNORMAL LOW (ref 4.22–5.81)
RDW: 13.5 % (ref 11.5–15.5)
WBC: 7.1 10*3/uL (ref 4.0–10.5)
nRBC: 0 % (ref 0.0–0.2)

## 2023-12-30 LAB — RENAL FUNCTION PANEL
Albumin: 3.4 g/dL — ABNORMAL LOW (ref 3.5–5.0)
Anion gap: 14 (ref 5–15)
BUN: 81 mg/dL — ABNORMAL HIGH (ref 8–23)
CO2: 24 mmol/L (ref 22–32)
Calcium: 8.3 mg/dL — ABNORMAL LOW (ref 8.9–10.3)
Chloride: 95 mmol/L — ABNORMAL LOW (ref 98–111)
Creatinine, Ser: 5.79 mg/dL — ABNORMAL HIGH (ref 0.61–1.24)
GFR, Estimated: 10 mL/min — ABNORMAL LOW (ref >60)
Glucose, Bld: 378 mg/dL — ABNORMAL HIGH (ref 70–99)
Phosphorus: 5.3 mg/dL — ABNORMAL HIGH (ref 2.5–4.6)
Potassium: 4.1 mmol/L (ref 3.5–5.1)
Sodium: 133 mmol/L — ABNORMAL LOW (ref 135–145)

## 2023-12-30 MED ORDER — LIDOCAINE HCL (PF) 1 % IJ SOLN: 5.0000 mL | INTRAMUSCULAR | Status: DC | PRN

## 2023-12-30 MED ORDER — ALTEPLASE 2 MG IJ SOLR: 2.0000 mg | Freq: Once | INTRAMUSCULAR | Status: DC | PRN

## 2023-12-30 MED ORDER — PENTAFLUOROPROP-TETRAFLUOROETH EX AERO: 1.0000 | INHALATION_SPRAY | CUTANEOUS | Status: DC | PRN

## 2023-12-30 MED ORDER — HEPARIN SODIUM (PORCINE) 1000 UNIT/ML DIALYSIS
2500.0000 [IU] | Freq: Once | INTRAMUSCULAR | Status: AC
  Administered 2023-12-30: 2500 [IU] via INTRAVENOUS_CENTRAL
  Filled 2023-12-30: qty 3

## 2023-12-30 MED ORDER — HEPARIN SODIUM (PORCINE) 1000 UNIT/ML DIALYSIS: 2000.0000 [IU] | INTRAMUSCULAR | Status: DC | PRN

## 2023-12-30 MED ORDER — HEPARIN SODIUM (PORCINE) 1000 UNIT/ML DIALYSIS: 1000.0000 [IU] | INTRAMUSCULAR | Status: DC | PRN

## 2023-12-30 MED ORDER — OXYCODONE HCL 5 MG PO TABS
10.0000 mg | ORAL_TABLET | ORAL | Status: DC | PRN
  Administered 2023-12-30: 10 mg via ORAL
  Filled 2023-12-30: qty 2

## 2023-12-30 MED ORDER — ANTICOAGULANT SODIUM CITRATE 4% (200MG/5ML) IV SOLN: 5.0000 mL | Status: DC | PRN

## 2023-12-30 MED ORDER — LIDOCAINE-PRILOCAINE 2.5-2.5 % EX CREA: 1.0000 | TOPICAL_CREAM | CUTANEOUS | Status: DC | PRN

## 2023-12-30 NOTE — ED Triage Notes (Signed)
Pt here for routine dialysis, last session was Thursday. Pt c.o chronic neck pain

## 2023-12-30 NOTE — ED Provider Notes (Signed)
[place] EMERGENCY DEPARTMENT AT [facility] Provider Note   CSN: [id number] Arrival date & time: 12/30/23  6606     History  No chief complaint on file.   Jeffrey [name] is a 63 y.o. male history of ESRD on dialysis presented for dialysis.  Patient is no acute concerns at this time is here for dialysis.  Patient denies chest pain shortness of breath paresthesias abdominal pain fevers.   Home Medications Prior to Admission medications   Medication Sig Start Date End Date Taking? Authorizing Provider  acetaminophen (TYLENOL) 325 MG tablet Take 2 tablets (650 mg total) by mouth every 6 (six) hours. 07/23/23   Lockie Mola, MD  amLODipine (NORVASC) 10 MG tablet Take 10 mg by mouth every morning. 04/17/20   [provider]  ASPIRIN LOW DOSE 81 MG EC tablet Take 81 mg by mouth in the morning. 02/22/21   [provider]  atorvastatin (LIPITOR) 40 MG tablet Take 40 mg by mouth in the morning. 02/09/21   [provider]  calcium acetate (PHOSLO) 667 MG capsule Take 2 capsules (1,334 mg total) by mouth with breakfast, with lunch, and with evening meal. 12/21/23   Ejigiri, Megan Mans, PA-C  carvedilol (COREG) 12.5 MG tablet Take 12.5 mg by mouth 2 (two) times daily with a meal. 04/17/20   [provider]  clopidogrel (PLAVIX) 75 MG tablet Take 1 tablet (75 mg total) by mouth daily. 11/02/22   Rhyne, Ames Coupe, PA-C  diazepam (VALIUM) 5 MG tablet Take 5 mg by mouth 2 (two) times daily as needed for muscle spasms. 12/09/21   [provider]  Fluticasone-Umeclidin-Vilant (TRELEGY ELLIPTA IN) Inhale 1 puff into the lungs daily.    [provider]  insulin lispro (HUMALOG KWIKPEN) 100 UNIT/ML KwikPen Inject 0-9 Units into the skin 3 (three) times daily with meals. CBG < 70: treat low blood sugar CBG 70 - 120: 0 units  CBG 121 - 150: 1 unit  CBG 151 - 200: 2 units  CBG 201 - 250: 3 units  CBG 251 - 300: 5 units  CBG 301 - 350: 7 units   CBG 351 - 400: 9 units  CBG > 400: call MD 09/27/23   Zigmund Daniel., MD  insulin regular human CONCENTRATED (HUMULIN R U-500 KWIKPEN) 500 UNIT/ML KwikPen Inject 55 Units into the skin 2 (two) times daily with a meal. Follow up with your PCP and [facility] for additional adjustments 09/27/23 10/27/23  Zigmund Daniel., MD  iron polysaccharides (NIFEREX) 150 MG capsule Take 1 capsule (150 mg total) by mouth daily. 09/26/23   Meredeth Ide, MD  nortriptyline (PAMELOR) 10 MG capsule Take 20 mg by mouth at bedtime.    [provider]  pantoprazole (PROTONIX) 40 MG tablet Take 40 mg by mouth daily before breakfast. 02/09/21   [provider]  pregabalin (LYRICA) 75 MG capsule Take 1 capsule (75 mg total) by mouth See admin instructions. Daily.  Give after dialysis on dialysis days. Patient taking differently: Take 75 mg by mouth daily. 01/21/23   [name], MD  sertraline (ZOLOFT) 100 MG tablet Take 100 mg by mouth in the morning. 02/22/21   [provider]  tamsulosin (FLOMAX) 0.4 MG CAPS capsule Take 1 capsule (0.4 mg total) by mouth daily. 11/10/21   [name], MD  torsemide (DEMADEX) 20 MG tablet Take 120 mg by mouth 2 (two) times daily.    [provider]  traZODone (  DESYREL) 150 MG tablet Take 150 mg by mouth at bedtime. 12/09/21   [provider]  Vitamin D, Ergocalciferol, (DRISDOL) 1.25 MG (50000 UNIT) CAPS capsule Take 1 capsule (50,000 Units total) by mouth every 7 (seven) days. 10/02/23   Meredeth Ide, MD      Allergies    Actos [pioglitazone], Dexmedetomidine, Ibuprofen, Tomato, and Wellbutrin [bupropion]    Review of Systems   Review of Systems  Physical Exam Updated Vital Signs BP 129/60   Pulse 86   Temp 98.6 F (37 C) (Oral)   Resp 18   SpO2 99%  Physical Exam Vitals reviewed.  Constitutional:      General: He is not in acute distress. HENT:     Head: Normocephalic and atraumatic.  Eyes:     Extraocular  Movements: Extraocular movements intact.     Conjunctiva/sclera: Conjunctivae normal.     Pupils: Pupils are equal, round, and reactive to light.  Cardiovascular:     Rate and Rhythm: Normal rate and regular rhythm.     Pulses: Normal pulses.     Heart sounds: Normal heart sounds.  Pulmonary:     Effort: Pulmonary effort is normal. No respiratory distress.     Breath sounds: Normal breath sounds.  Abdominal:     Palpations: Abdomen is soft.     Tenderness: There is no abdominal tenderness. There is no guarding or rebound.  Musculoskeletal:        General: Normal range of motion.     Cervical back: Normal range of motion and neck supple.  Skin:    General: Skin is warm and dry.     Capillary Refill: Capillary refill takes less than 2 seconds.  Neurological:     General: No focal deficit present.     Mental Status: He is alert and oriented to person, place, and time.     Comments: Sensation intact in all 4 limbs  Psychiatric:        Mood and Affect: Mood normal.     ED Results / Procedures / Treatments   Labs (all labs ordered are listed, but only abnormal results are displayed) Labs Reviewed  RENAL FUNCTION PANEL  CBC  RENAL FUNCTION PANEL  HEPATITIS B SURFACE ANTIGEN  HEPATITIS B SURFACE ANTIBODY, QUANTITATIVE    EKG None  Radiology No results found.  Procedures Procedures    Medications Ordered in ED Medications  pentafluoroprop-tetrafluoroeth (GEBAUERS) aerosol 1 Application (has no administration in time range)  lidocaine (PF) (XYLOCAINE) 1 % injection 5 mL (has no administration in time range)  lidocaine-prilocaine (EMLA) cream 1 Application (has no administration in time range)  heparin injection 1,000 Units (has no administration in time range)  anticoagulant sodium citrate solution 5 mL (has no administration in time range)  alteplase (CATHFLO ACTIVASE) injection 2 mg (has no administration in time range)  heparin injection 2,000 Units (has no  administration in time range)    ED Course/ Medical Decision Making/ A&P                                 Medical Decision Making  [name] 63 y.o. presented today for dialysis. Working DDx that I considered at this time includes, but not limited to, electrolyte abnormalities, arrhythmias, emergent dialysis, dehydration, fluid overload, pleural effusions, uremic encephalopathy.  R/o DDx: electrolyte abnormalities, arrhythmias, emergent dialysis, dehydration, fluid overload, pleural effusions, uremic encephalopathy: These are considered less likely  due to history of present illness, physical exam, labs/imaging findings.  Review of prior external notes: 12/28/2023 ED  Unique Tests and My Independent Interpretation:  Hepatitis B surface antigen: Pending Renal function panel: Pending CBC: Pending  Social Determinants of Health: none  Discussion with Independent Historian: None  Discussion of Management of Tests:  Kathrene Bongo, MD Nephrology  Risk: Low: based on diagnostic testing/clinical impression and treatment plan  Risk Stratification Score: None  Plan: On exam patient was no acute distress with stable vitals.  Patient no acute concerns today and is here for dialysis so we will obtain labs and consult nephrology.  Contacted nephrology and they agree with dialysis for the patient. Will await for Nephrology to transport patient for dialysis and anticipate discharge afterwards.  This chart was dictated using voice recognition software.  Despite best efforts to proofread,  errors can occur which can change the documentation meaning.         Final Clinical Impression(s) / ED Diagnoses Final diagnoses:  ESRD (end stage renal disease) on dialysis [facility])    Rx / DC Orders ED Discharge Orders     None         Yoandri, [place] 12/30/23 0818    Lorre Nick, MD 01/01/24 [phone]

## 2023-12-30 NOTE — Progress Notes (Signed)
Received patient in bed to unit.  Alert and oriented.  Informed consent signed and in chart.   TX duration: 2.5 Hours  Patient tolerated well.  Transported back to the room  Alert, without acute distress.  Hand-off given to patient's nurse.   Access used: Left AV Graft Upper Arm Access issues: none  Total UF removed: 3L Medication(s) given: Oxycodone   12/30/23 1728  Vitals  BP (!) 119/103  MAP (mmHg) 110  Pulse Rate 93  ECG Heart Rate 95  Resp 11  Oxygen Therapy  SpO2 99 %  O2 Device Nasal Cannula  O2 Flow Rate (L/min) 3 L/min  During Treatment Monitoring  Duration of HD Treatment -hour(s) 2.5 hour(s)  HD Safety Checks Performed Yes  Intra-Hemodialysis Comments Tx completed;Tolerated well  Dialysis Fluid Bolus Normal Saline  Bolus Amount (mL) 300 mL  Post Treatment  Dialyzer Clearance Clear  Liters Processed 60  Fluid Removed (mL) 3000 mL  Tolerated HD Treatment Yes  AVG/AVF Arterial Site Held (minutes) 7 minutes  AVG/AVF Venous Site Held (minutes) 7 minutes  Fistula / Graft Left Upper arm Arteriovenous vein graft  Placement Date/Time: (c) 04/22/23 (c) 1000   Orientation: Left  Access Location: Upper arm  Access Type: Arteriovenous vein graft  Status Deaccessed     Stacie Glaze LPN [facility]

## 2023-12-30 NOTE — Progress Notes (Signed)
   12/30/23 1728  Vitals  Temp 97.6 F (36.4 C)  Temp Source Oral  BP (!) 119/103  MAP (mmHg) 110  Pulse Rate 93  ECG Heart Rate 95  Resp 11  Oxygen Therapy  SpO2 99 %  O2 Device Nasal Cannula  O2 Flow Rate (L/min) 3 L/min  During Treatment Monitoring  Duration of HD Treatment -hour(s) 2.5 hour(s)  HD Safety Checks Performed Yes  Intra-Hemodialysis Comments Tx completed;Tolerated well  Dialysis Fluid Bolus Normal Saline  Bolus Amount (mL) 300 mL  Post Treatment  Dialyzer Clearance Clear  Liters Processed 60  Fluid Removed (mL) 3000 mL  Tolerated HD Treatment Yes  AVG/AVF Arterial Site Held (minutes) 7 minutes  AVG/AVF Venous Site Held (minutes) 7 minutes  Fistula / Graft Left Upper arm Arteriovenous vein graft  Placement Date/Time: (c) 04/22/23 (c) 1000   Orientation: Left  Access Location: Upper arm  Access Type: Arteriovenous vein graft  Status Deaccessed

## 2024-01-04 ENCOUNTER — Emergency Department (HOSPITAL_COMMUNITY)
Admission: EM | Admit: 2024-01-04 | Discharge: 2024-01-04 | Disposition: A | Discharge status: Home / Self Care | Payer: 59 | Attending: Emergency Medicine | Admitting: Emergency Medicine

## 2024-01-04 ENCOUNTER — Encounter (HOSPITAL_COMMUNITY): Payer: Self-pay

## 2024-01-04 ENCOUNTER — Other Ambulatory Visit: Payer: Self-pay

## 2024-01-04 DIAGNOSIS — N186 End stage renal disease: Secondary | ICD-10-CM | POA: Diagnosis not present

## 2024-01-04 DIAGNOSIS — Z79899 Other long term (current) drug therapy: Secondary | ICD-10-CM | POA: Insufficient documentation

## 2024-01-04 DIAGNOSIS — E1122 Type 2 diabetes mellitus with diabetic chronic kidney disease: Secondary | ICD-10-CM | POA: Insufficient documentation

## 2024-01-04 DIAGNOSIS — Z794 Long term (current) use of insulin: Secondary | ICD-10-CM | POA: Diagnosis not present

## 2024-01-04 DIAGNOSIS — M542 Cervicalgia: Secondary | ICD-10-CM | POA: Insufficient documentation

## 2024-01-04 DIAGNOSIS — Z7982 Long term (current) use of aspirin: Secondary | ICD-10-CM | POA: Diagnosis not present

## 2024-01-04 DIAGNOSIS — I120 Hypertensive chronic kidney disease with stage 5 chronic kidney disease or end stage renal disease: Secondary | ICD-10-CM | POA: Diagnosis present

## 2024-01-04 DIAGNOSIS — Z7902 Long term (current) use of antithrombotics/antiplatelets: Secondary | ICD-10-CM | POA: Diagnosis not present

## 2024-01-04 DIAGNOSIS — Z992 Dependence on renal dialysis: Secondary | ICD-10-CM | POA: Diagnosis not present

## 2024-01-04 MED ORDER — OXYCODONE-ACETAMINOPHEN 5-325 MG PO TABS
1.0000 | ORAL_TABLET | Freq: Once | ORAL | Status: AC
  Administered 2024-01-04: 1 via ORAL
  Filled 2024-01-04: qty 1

## 2024-01-04 MED ORDER — DARBEPOETIN ALFA 200 MCG/0.4ML IJ SOSY
200.0000 ug | PREFILLED_SYRINGE | Freq: Once | INTRAMUSCULAR | Status: AC
  Administered 2024-01-04: 200 ug via SUBCUTANEOUS
  Filled 2024-01-04: qty 0.4

## 2024-01-04 MED ORDER — HEPARIN SODIUM (PORCINE) 1000 UNIT/ML IJ SOLN
INTRAMUSCULAR | Status: AC
  Administered 2024-01-04: 2000 [IU]
  Filled 2024-01-04: qty 2

## 2024-01-04 MED ORDER — LIDOCAINE 5 % EX PTCH
1.0000 | MEDICATED_PATCH | Freq: Once | CUTANEOUS | Status: AC
  Administered 2024-01-04: 1 via TRANSDERMAL
  Filled 2024-01-04: qty 1

## 2024-01-04 MED ORDER — CHLORHEXIDINE GLUCONATE CLOTH 2 % EX PADS: 6.0000 | MEDICATED_PAD | Freq: Every day | CUTANEOUS | Status: DC

## 2024-01-04 NOTE — ED Triage Notes (Signed)
 Pt. Had last treatment on Saturday didn't feel  like it the rest, I felt so bad.

## 2024-01-04 NOTE — Procedures (Addendum)
 Asked to see patient for hospital dialysis. Patient is not being admitted. The plan will be for ED HD. Pt will go to the dialysis unit upstairs when they are ready for the patient. When dialysis is completed pt will be sent back to ED for reassessment.      Last OP HD orders (dec 2023):  3:15h  129kg  RUE AVG   Heparin  2000       Clinical issues: 1) Anemia of esrd:  ** Pt dose not have an outpatient nephrologist or HD unit so does not have access to ESA and other needed medications.  ** pt had darbe in spring 2024 May, but Hb continued to drop, so dosing of darbepoeitin was increased up to 100 and then 150 mcg q 1-2 wks.  Also pt required two 1 gm IV Fe loading programs during the summer and early fall to get the Hb to respond up to Hb 8-10. In Sept / Oct 2024, Hb dropped down back into the 8's and so dose was ^'d to 200 mcg (every 1-2 wks). In Jan 2025 Hb is around 9- 10. Plan for today -->    Plan: Last dose - 12/03 darbepoetin 200 micrograms; overdue for next dose --> plan darbe 200 mcg SQ while here today.  Darbepoeitin needs to ordered as once  or once in dialysis. Otherwise the order will default to SQ at 1800 but that timing is not good because he typically needs to be ready to leave at 5 pm to catch his ride home.  I was present at the procedure, reviewed the HD regimen and made appropriate changes.   Myer Fret MD  [facility] 01/04/2024, 4:58 PM

## 2024-01-04 NOTE — Discharge Instructions (Addendum)
 You were seen in the ER for your dialysis. Continue to go to dialysis every Tuesday, Thursday and Saturday. Follow up with your primary doctor as needed. Return to the ER sooner if you have any new or concerning symptoms.

## 2024-01-04 NOTE — Progress Notes (Addendum)
 Received patient inbed.Awake,alert and oriented x 4. Consent verified.  Access used : Left upper arm AVG that worked well.  Duration of treatment : 3.15 hours.  Uf goal : 2.7 liters .  Tolerated treatment : Yes.  Medicine given : 1 tab of Percocet.  Hand off to the patient's nurse : Back to ED with stable medical condition via transporter.

## 2024-01-04 NOTE — ED Provider Notes (Signed)
 [place] EMERGENCY DEPARTMENT AT [facility] Provider Note   CSN: [id number] Arrival date & time: 01/04/24  9275     History  Chief Complaint  Patient presents with   Vascular Access Problem   Neck Pain    Jeffrey [name] is a 63 y.o. male.  Patient is a 63 year old male with a past medical history of ESRD without home HD unit, hypertension, diabetes chronic back pain that presented to the emergency department for dialysis.  He was last dialyzed on Saturday.  States that he missed Tuesday due to feeling bad from his neck pain.  He states that he is otherwise been doing well without any shortness of breath or increased swelling.  The history is provided by the patient.  Neck Pain      Home Medications Prior to Admission medications   Medication Sig Start Date End Date Taking? Authorizing Provider  acetaminophen  (TYLENOL ) 325 MG tablet Take 2 tablets (650 mg total) by mouth every 6 (six) hours. 07/23/23   Nicholas Bar, MD  amLODipine  (NORVASC ) 10 MG tablet Take 10 mg by mouth every morning. 04/17/20   [provider]  ASPIRIN  LOW DOSE 81 MG EC tablet Take 81 mg by mouth in the morning. 02/22/21   [provider]  atorvastatin  (LIPITOR) 40 MG tablet Take 40 mg by mouth in the morning. 02/09/21   [provider]  calcium  acetate (PHOSLO ) 667 MG capsule Take 2 capsules (1,334 mg total) by mouth with breakfast, with lunch, and with evening meal. 12/21/23   Ejigiri, Maisie Fellows, PA-C  carvedilol  (COREG ) 12.5 MG tablet Take 12.5 mg by mouth 2 (two) times daily with a meal. 04/17/20   [provider]  clopidogrel  (PLAVIX ) 75 MG tablet Take 1 tablet (75 mg total) by mouth daily. 11/02/22   Rhyne, Samantha J, PA-C  diazepam  (VALIUM ) 5 MG tablet Take 5 mg by mouth 2 (two) times daily as needed for muscle spasms. 12/09/21   [provider]  Fluticasone -Umeclidin-Vilant (TRELEGY ELLIPTA IN) Inhale 1 puff into the lungs daily.    [provider]  insulin  lispro (HUMALOG  KWIKPEN) 100 UNIT/ML KwikPen Inject 0-9 Units into the skin 3 (three) times daily with meals. CBG < 70: treat low blood sugar CBG 70 - 120: 0 units  CBG 121 - 150: 1 unit  CBG 151 - 200: 2 units  CBG 201 - 250: 3 units  CBG 251 - 300: 5 units  CBG 301 - 350: 7 units  CBG 351 - 400: 9 units  CBG > 400: call MD 09/27/23   Perri DELENA Meliton Mickey., MD  insulin  regular human CONCENTRATED (HUMULIN  R U-500 KWIKPEN) 500 UNIT/ML KwikPen Inject 55 Units into the skin 2 (two) times daily with a meal. Follow up with your PCP and [facility] for additional adjustments 09/27/23 10/27/23  Perri DELENA Meliton Mickey., MD  iron  polysaccharides (NIFEREX) 150 MG capsule Take 1 capsule (150 mg total) by mouth daily. 09/26/23   Drusilla Sabas RAMAN, MD  nortriptyline  (PAMELOR ) 10 MG capsule Take 20 mg by mouth at bedtime.    [provider]  pantoprazole  (PROTONIX ) 40 MG tablet Take 40 mg by mouth daily before breakfast. 02/09/21   [provider]  pregabalin  (LYRICA ) 75 MG capsule Take 1 capsule (75 mg total) by mouth See admin instructions. Daily.  Give after dialysis on dialysis days. Patient taking differently: Take 75 mg by mouth daily. 01/21/23   Randol Simmonds, MD  sertraline  (ZOLOFT ) 100  MG tablet Take 100 mg by mouth in the morning. 02/22/21   [provider]  tamsulosin  (FLOMAX ) 0.4 MG CAPS capsule Take 1 capsule (0.4 mg total) by mouth daily. 11/10/21   Vernon Ranks, MD  torsemide  (DEMADEX ) 20 MG tablet Take 120 mg by mouth 2 (two) times daily.    [provider]  traZODone  (DESYREL ) 150 MG tablet Take 150 mg by mouth at bedtime. 12/09/21   [provider]  Vitamin D , Ergocalciferol , (DRISDOL ) 1.25 MG (50000 UNIT) CAPS capsule Take 1 capsule (50,000 Units total) by mouth every 7 (seven) days. 10/02/23   Drusilla Sabas RAMAN, MD      Allergies    Actos [pioglitazone], Dexmedetomidine , Ibuprofen, Tomato, and Wellbutrin [bupropion]    Review of  Systems   Review of Systems  Musculoskeletal:  Positive for neck pain.    Physical Exam Updated Vital Signs BP (!) 163/85   Pulse 76   Temp 97.8 F (36.6 C) (Oral)   Resp 11   Ht 5' 5 (1.651 m)   Wt 135.2 kg   SpO2 100%   BMI 49.59 kg/m  Physical Exam Vitals and nursing note reviewed.  Constitutional:      General: He is not in acute distress.    Appearance: Normal appearance. He is obese.  HENT:     Head: Normocephalic and atraumatic.     Nose: Nose normal.     Mouth/Throat:     Mouth: Mucous membranes are moist.  Eyes:     Extraocular Movements: Extraocular movements intact.  Cardiovascular:     Rate and Rhythm: Normal rate and regular rhythm.     Heart sounds: Normal heart sounds.  Pulmonary:     Effort: Pulmonary effort is normal.     Breath sounds: Normal breath sounds.  Abdominal:     General: Abdomen is flat.     Palpations: Abdomen is soft.  Musculoskeletal:     Cervical back: Normal range of motion.     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  Neurological:     General: No focal deficit present.     Mental Status: He is alert and oriented to person, place, and time.  Psychiatric:        Mood and Affect: Mood normal.        Behavior: Behavior normal.     ED Results / Procedures / Treatments   Labs (all labs ordered are listed, but only abnormal results are displayed) Labs Reviewed - No data to display  EKG None  Radiology No results found.  Procedures Procedures    Medications Ordered in ED Medications  lidocaine  (LIDODERM ) 5 % 1-3 patch (1 patch Transdermal Patch Applied 01/04/24 0839)  Chlorhexidine  Gluconate Cloth 2 % PADS 6 each (has no administration in time range)  heparin  sodium (porcine) 1000 UNIT/ML injection (has no administration in time range)  oxyCODONE -acetaminophen  (PERCOCET/ROXICET) 5-325 MG per tablet 1 tablet (1 tablet Oral Given 01/04/24 0838)  Darbepoetin  Alfa (ARANESP ) injection 200 mcg (200 mcg Subcutaneous Given 01/04/24 0910)  oxyCODONE -acetaminophen  (PERCOCET/ROXICET) 5-325 MG per tablet 1 tablet (1 tablet Oral Given 01/04/24 1314)    ED Course/ Medical Decision Making/ A&P Clinical Course as of 01/04/24 1454  Thu Jan 04, 2024  0812 I spoke with Dr. Susannah with nephrology who is placing orders for HD today. [VK]  1454 Patient signed out to oncoming provider pending reassessment after HD. [  VK]    Clinical Course User Index [VK] Kingsley, Jaivion Kingsley K, DO                                 Medical Decision Making This patient presents to the ED with chief complaint(s) of needing dialysis with pertinent past medical history of ESRD without home HD unit, HTN, DM, chronic neck pain which further complicates the presenting complaint. The complaint involves an extensive differential diagnosis and also carries with it a high risk of complications and morbidity.    The differential diagnosis includes no significant volume overload on exam, electrolyte abnormality   Additional history obtained: Additional history obtained from N/A Records reviewed N/A  ED Course and Reassessment: On patient's arrival he is hemodynamically stable in no acute distress.  Will plan to consult nephrology for dialysis today.  Will discuss if he needs any further HD labs.  Patient is requesting pain control for his chronic back pain.  Independent labs interpretation:  N/A  Independent visualization of imaging: - N/A  Consultation: - Consulted or discussed management/test interpretation w/ external professional: nephrology  Social Determinants of health: no home HD unit    Risk Prescription drug management.          Final Clinical Impression(s) / ED Diagnoses Final diagnoses:  Dialysis patient [facility])    Rx / DC Orders ED Discharge Orders     None         Kingsley, Amry Cathy K, DO 01/04/24 1454

## 2024-01-06 ENCOUNTER — Encounter (HOSPITAL_COMMUNITY): Payer: Self-pay

## 2024-01-06 ENCOUNTER — Emergency Department (HOSPITAL_COMMUNITY)
Admission: EM | Admit: 2024-01-06 | Discharge: 2024-01-06 | Discharge status: Against Medical Advice | Payer: 59 | Attending: Emergency Medicine | Admitting: Emergency Medicine

## 2024-01-06 ENCOUNTER — Other Ambulatory Visit: Payer: Self-pay

## 2024-01-06 DIAGNOSIS — Z5329 Procedure and treatment not carried out because of patient's decision for other reasons: Secondary | ICD-10-CM | POA: Diagnosis not present

## 2024-01-06 DIAGNOSIS — Z992 Dependence on renal dialysis: Secondary | ICD-10-CM | POA: Insufficient documentation

## 2024-01-06 DIAGNOSIS — N186 End stage renal disease: Secondary | ICD-10-CM | POA: Diagnosis not present

## 2024-01-06 DIAGNOSIS — Z452 Encounter for adjustment and management of vascular access device: Secondary | ICD-10-CM | POA: Diagnosis present

## 2024-01-06 LAB — RENAL FUNCTION PANEL
Albumin: 3.5 g/dL (ref 3.5–5.0)
Anion gap: 15 (ref 5–15)
BUN: 68 mg/dL — ABNORMAL HIGH (ref 8–23)
CO2: 23 mmol/L (ref 22–32)
Calcium: 8.4 mg/dL — ABNORMAL LOW (ref 8.9–10.3)
Chloride: 95 mmol/L — ABNORMAL LOW (ref 98–111)
Creatinine, Ser: 5.55 mg/dL — ABNORMAL HIGH (ref 0.61–1.24)
GFR, Estimated: 11 mL/min — ABNORMAL LOW (ref >60)
Glucose, Bld: 269 mg/dL — ABNORMAL HIGH (ref 70–99)
Phosphorus: 4.8 mg/dL — ABNORMAL HIGH (ref 2.5–4.6)
Potassium: 4.2 mmol/L (ref 3.5–5.1)
Sodium: 133 mmol/L — ABNORMAL LOW (ref 135–145)

## 2024-01-06 MED ORDER — OXYCODONE-ACETAMINOPHEN 5-325 MG PO TABS
1.0000 | ORAL_TABLET | Freq: Once | ORAL | Status: AC
  Administered 2024-01-06: 1 via ORAL
  Filled 2024-01-06: qty 1

## 2024-01-06 MED ORDER — CHLORHEXIDINE GLUCONATE CLOTH 2 % EX PADS: 6.0000 | MEDICATED_PAD | Freq: Every day | CUTANEOUS | Status: DC

## 2024-01-06 NOTE — Procedures (Signed)
 Asked to see patient for hospital dialysis. Patient is not being admitted. The plan will be for ED HD. Pt will go to the dialysis unit upstairs when they are ready for the patient. When dialysis is completed pt will be sent back to ED for reassessment.      Last OP HD orders (dec 2023):  3:15h  129kg  RUE AVG   Heparin  2000     I was present at the procedure, reviewed the HD regimen and made appropriate changes.   Myer Fret MD  [facility] 01/06/2024, 10:41 AM

## 2024-01-06 NOTE — ED Triage Notes (Signed)
 Here for his routine dialysis.  No complaints other than his chronic neck pain.  3L [place] baseline.

## 2024-01-06 NOTE — Progress Notes (Signed)
 Pt completed hemodialysis  tx without issue.  01/06/24 1250  Vitals  Temp 98.2 F (36.8 C)  Temp Source Oral  BP (!) 167/70  BP Location Right Wrist  BP Method Automatic  Patient Position (if appropriate) Sitting  Pulse Rate 86  Resp 20  Oxygen Therapy  SpO2 100 %  O2 Device Nasal Cannula  O2 Flow Rate (L/min) 3 L/min  During Treatment Monitoring  Intra-Hemodialysis Comments Tx completed  Post Treatment  Dialyzer Clearance Lightly streaked  Hemodialysis Intake (mL) 0 mL  Liters Processed 72  Fluid Removed (mL) 2400 mL  Tolerated HD Treatment Yes  Post-Hemodialysis Comments Pt goal met.  AVG/AVF Arterial Site Held (minutes) 10 minutes  AVG/AVF Venous Site Held (minutes) 10 minutes  Fistula / Graft Left Upper arm Arteriovenous vein graft  Placement Date/Time: (c) 04/22/23 (c) 1000   Orientation: Left  Access Location: Upper arm  Access Type: Arteriovenous vein graft  Site Condition No complications  Fistula / Graft Assessment Present;Thrill;Bruit  Status Deaccessed  Needle Size 15  Drainage Description None

## 2024-01-06 NOTE — ED Provider Notes (Signed)
 [place] [facility] Provider Note   CSN: [id number] Arrival date & time: 01/06/24  9272     History  Chief Complaint  Patient presents with   Vascular Access Problem    Jeffrey [name] is a 63 y.o. male who returns to the ED for his scheduled hemodialysis treatment.  Patient ESRD on dialysis Tuesday, Thursday and Saturday.  Reports last treatment was on Thursday.  Denies any chest pain, shortness of breath, nausea or vomiting.  He is endorsing chronic neck pain, requesting pain medication.  Dr. Geralynn of nephrology has been made aware of the patient.  HPI     Home Medications Prior to Admission medications   Medication Sig Start Date End Date Taking? Authorizing Provider  acetaminophen  (TYLENOL ) 325 MG tablet Take 2 tablets (650 mg total) by mouth every 6 (six) hours. 07/23/23   Nicholas Bar, MD  amLODipine  (NORVASC ) 10 MG tablet Take 10 mg by mouth every morning. 04/17/20   [provider]  ASPIRIN  LOW DOSE 81 MG EC tablet Take 81 mg by mouth in the morning. 02/22/21   [provider]  atorvastatin  (LIPITOR) 40 MG tablet Take 40 mg by mouth in the morning. 02/09/21   [provider]  calcium  acetate (PHOSLO ) 667 MG capsule Take 2 capsules (1,334 mg total) by mouth with breakfast, with lunch, and with evening meal. 12/21/23   Ejigiri, Maisie Fellows, PA-C  carvedilol  (COREG ) 12.5 MG tablet Take 12.5 mg by mouth 2 (two) times daily with a meal. 04/17/20   [provider]  clopidogrel  (PLAVIX ) 75 MG tablet Take 1 tablet (75 mg total) by mouth daily. 11/02/22   Rhyne, Samantha J, PA-C  diazepam  (VALIUM ) 5 MG tablet Take 5 mg by mouth 2 (two) times daily as needed for muscle spasms. 12/09/21   [provider]  Fluticasone -Umeclidin-Vilant (TRELEGY ELLIPTA IN) Inhale 1 puff into the lungs daily.    [provider]  insulin  lispro (HUMALOG  KWIKPEN) 100 UNIT/ML KwikPen Inject 0-9 Units into the skin 3 (three)  times daily with meals. CBG < 70: treat low blood sugar CBG 70 - 120: 0 units  CBG 121 - 150: 1 unit  CBG 151 - 200: 2 units  CBG 201 - 250: 3 units  CBG 251 - 300: 5 units  CBG 301 - 350: 7 units  CBG 351 - 400: 9 units  CBG > 400: call MD 09/27/23   Perri DELENA Meliton Mickey., MD  insulin  regular human CONCENTRATED (HUMULIN  R U-500 KWIKPEN) 500 UNIT/ML KwikPen Inject 55 Units into the skin 2 (two) times daily with a meal. Follow up with your PCP and [facility] for additional adjustments 09/27/23 10/27/23  Perri DELENA Meliton Mickey., MD  iron  polysaccharides (NIFEREX) 150 MG capsule Take 1 capsule (150 mg total) by mouth daily. 09/26/23   Drusilla Sabas RAMAN, MD  nortriptyline  (PAMELOR ) 10 MG capsule Take 20 mg by mouth at bedtime.    [provider]  pantoprazole  (PROTONIX ) 40 MG tablet Take 40 mg by mouth daily before breakfast. 02/09/21   [provider]  pregabalin  (LYRICA ) 75 MG capsule Take 1 capsule (75 mg total) by mouth See admin instructions. Daily.  Give after dialysis on dialysis days. Patient taking differently: Take 75 mg by mouth daily. 01/21/23   Randol Simmonds, MD  sertraline  (ZOLOFT ) 100 MG tablet Take 100 mg by mouth in the morning. 02/22/21   [provider]  tamsulosin  (FLOMAX ) 0.4 MG CAPS capsule Take  1 capsule (0.4 mg total) by mouth daily. 11/10/21   Vernon Ranks, MD  torsemide  (DEMADEX ) 20 MG tablet Take 120 mg by mouth 2 (two) times daily.    [provider]  traZODone  (DESYREL ) 150 MG tablet Take 150 mg by mouth at bedtime. 12/09/21   [provider]  Vitamin D , Ergocalciferol , (DRISDOL ) 1.25 MG (50000 UNIT) CAPS capsule Take 1 capsule (50,000 Units total) by mouth every 7 (seven) days. 10/02/23   Drusilla Sabas RAMAN, MD      Allergies    Actos [pioglitazone], Dexmedetomidine , Ibuprofen, Tomato, and Wellbutrin [bupropion]    Review of Systems   Review of Systems  Musculoskeletal:  Positive for neck pain.  All other systems reviewed and are  negative.   Physical Exam Updated Vital Signs BP (!) 161/56   Pulse 83   Temp 98 F (36.7 C) (Oral)   Resp 15   Ht 5' 5 (1.651 m)   Wt 132.5 kg   SpO2 100%   BMI 48.61 kg/m  Physical Exam Vitals and nursing note reviewed.  Constitutional:      General: He is not in acute distress.    Appearance: He is well-developed.  HENT:     Head: Normocephalic and atraumatic.  Eyes:     Conjunctiva/sclera: Conjunctivae normal.  Cardiovascular:     Rate and Rhythm: Normal rate and regular rhythm.     Heart sounds: No murmur heard. Pulmonary:     Effort: Pulmonary effort is normal. No respiratory distress.     Breath sounds: Normal breath sounds.  Abdominal:     Palpations: Abdomen is soft.     Tenderness: There is no abdominal tenderness.  Musculoskeletal:        General: No swelling.     Cervical back: Neck supple.     Comments: Left-sided AKA  Skin:    General: Skin is warm and dry.     Capillary Refill: Capillary refill takes less than 2 seconds.  Neurological:     Mental Status: He is alert.  Psychiatric:        Mood and Affect: Mood normal.     ED Results / Procedures / Treatments   Labs (all labs ordered are listed, but only abnormal results are displayed) Labs Reviewed  RENAL FUNCTION PANEL - Abnormal; Notable for the following components:      Result Value   Sodium 133 (*)    Chloride 95 (*)    Glucose, Bld 269 (*)    BUN 68 (*)    Creatinine, Ser 5.55 (*)    Calcium  8.4 (*)    Phosphorus 4.8 (*)    GFR, Estimated 11 (*)    All other components within normal limits  CBC    EKG None  Radiology No results found.  Procedures Procedures   Medications Ordered in ED Medications  Chlorhexidine  Gluconate Cloth 2 % PADS 6 each (has no administration in time range)  oxyCODONE -acetaminophen  (PERCOCET/ROXICET) 5-325 MG per tablet 1 tablet (1 tablet Oral Given 01/06/24 9188)    ED Course/ Medical Decision Making/ A&P   Medical Decision Making Amount  and/or Complexity of Data Reviewed Labs: ordered.  Risk Prescription drug management.   63 year old male presents for scheduled dialysis session.  Please see HPI for further details.  On exam patient denies any concerns, symptoms.  Patient reports chronic neck pain, requesting pain medication.  Patient provided with 5 mg oxycodone .  Collected labs to include CBC, renal function panel.  Advise Dr. Geralynn  of patient presents in the ED.  He has been scheduled for dialysis, orders placed.   Final Clinical Impression(s) / ED Diagnoses Final diagnoses:  ESRD (end stage renal disease) [facility])  Dialysis patient [facility])    Rx / DC Orders ED Discharge Orders     None         Ruthell Lonni JULIANNA DEVONNA 01/06/24 9071    Towana Ozell BROCKS, MD 01/06/24 1744

## 2024-01-09 ENCOUNTER — Encounter (HOSPITAL_COMMUNITY): Payer: Self-pay

## 2024-01-09 ENCOUNTER — Emergency Department (HOSPITAL_COMMUNITY)
Admission: EM | Admit: 2024-01-09 | Discharge: 2024-01-09 | Discharge status: Against Medical Advice | Payer: 59 | Attending: Emergency Medicine | Admitting: Emergency Medicine

## 2024-01-09 ENCOUNTER — Other Ambulatory Visit: Payer: Self-pay

## 2024-01-09 DIAGNOSIS — M542 Cervicalgia: Secondary | ICD-10-CM | POA: Diagnosis present

## 2024-01-09 DIAGNOSIS — Z7982 Long term (current) use of aspirin: Secondary | ICD-10-CM | POA: Diagnosis not present

## 2024-01-09 DIAGNOSIS — Z7901 Long term (current) use of anticoagulants: Secondary | ICD-10-CM | POA: Insufficient documentation

## 2024-01-09 DIAGNOSIS — Z992 Dependence on renal dialysis: Secondary | ICD-10-CM | POA: Diagnosis not present

## 2024-01-09 DIAGNOSIS — N186 End stage renal disease: Secondary | ICD-10-CM | POA: Insufficient documentation

## 2024-01-09 LAB — CBC WITH DIFFERENTIAL/PLATELET
Abs Immature Granulocytes: 0.2 10*3/uL — ABNORMAL HIGH (ref 0.00–0.07)
Basophils Absolute: 0 10*3/uL (ref 0.0–0.1)
Basophils Relative: 0 %
Eosinophils Absolute: 0.1 10*3/uL (ref 0.0–0.5)
Eosinophils Relative: 2 %
HCT: 28.6 % — ABNORMAL LOW (ref 39.0–52.0)
Hemoglobin: 9.1 g/dL — ABNORMAL LOW (ref 13.0–17.0)
Immature Granulocytes: 3 %
Lymphocytes Relative: 9 %
Lymphs Abs: 0.7 10*3/uL (ref 0.7–4.0)
MCH: 28.9 pg (ref 26.0–34.0)
MCHC: 31.8 g/dL (ref 30.0–36.0)
MCV: 90.8 fL (ref 80.0–100.0)
Monocytes Absolute: 0.5 10*3/uL (ref 0.1–1.0)
Monocytes Relative: 6 %
Neutro Abs: 6.1 10*3/uL (ref 1.7–7.7)
Neutrophils Relative %: 80 %
Platelets: 115 10*3/uL — ABNORMAL LOW (ref 150–400)
RBC: 3.15 MIL/uL — ABNORMAL LOW (ref 4.22–5.81)
RDW: 14.2 % (ref 11.5–15.5)
WBC: 7.6 10*3/uL (ref 4.0–10.5)
nRBC: 0 % (ref 0.0–0.2)

## 2024-01-09 LAB — BASIC METABOLIC PANEL
Anion gap: 8 (ref 5–15)
BUN: 86 mg/dL — ABNORMAL HIGH (ref 8–23)
CO2: 24 mmol/L (ref 22–32)
Calcium: 7.8 mg/dL — ABNORMAL LOW (ref 8.9–10.3)
Chloride: 100 mmol/L (ref 98–111)
Creatinine, Ser: 6.15 mg/dL — ABNORMAL HIGH (ref 0.61–1.24)
GFR, Estimated: 10 mL/min — ABNORMAL LOW (ref >60)
Glucose, Bld: 483 mg/dL — ABNORMAL HIGH (ref 70–99)
Potassium: 4.7 mmol/L (ref 3.5–5.1)
Sodium: 132 mmol/L — ABNORMAL LOW (ref 135–145)

## 2024-01-09 MED ORDER — HYDROMORPHONE HCL 1 MG/ML IJ SOLN
1.0000 mg | Freq: Once | INTRAMUSCULAR | Status: AC
  Administered 2024-01-09: 1 mg via INTRAMUSCULAR
  Filled 2024-01-09: qty 1

## 2024-01-09 MED ORDER — HYDROMORPHONE HCL 1 MG/ML IJ SOLN
0.5000 mg | Freq: Once | INTRAMUSCULAR | Status: AC
  Administered 2024-01-09: 0.5 mg via INTRAVENOUS
  Filled 2024-01-09: qty 0.5

## 2024-01-09 MED ORDER — CHLORHEXIDINE GLUCONATE CLOTH 2 % EX PADS: 6.0000 | MEDICATED_PAD | Freq: Every day | CUTANEOUS | Status: DC

## 2024-01-09 MED ORDER — HEPARIN SODIUM (PORCINE) 1000 UNIT/ML IJ SOLN
INTRAMUSCULAR | Status: AC
  Administered 2024-01-09: 2000 [IU]
  Filled 2024-01-09: qty 2

## 2024-01-09 NOTE — Progress Notes (Signed)
Received patient in bed to unit.  Alert and oriented.  Informed consent signed and in chart.   TX duration: 2 hours and 12 minutes.  Patient terminated session due to ride being here.  AMA paperwork signed.  NP Salome Holmes informed.  Patient tolerated well.  Transported back to the room  Alert, without acute distress.  Hand-off given to patient's nurse.   Access used: Left Upper Arm graft Access issues: none  Total UF removed: 1.6L Medication(s) given: Dilaudid   01/09/24 1630  Vitals  Temp 97.7 F (36.5 C)  Temp Source Oral  BP (!) 185/146  MAP (mmHg) 158  Pulse Rate 89 (Simultaneous filing. User may not have seen previous data.)  ECG Heart Rate 87  Oxygen Therapy  SpO2 97 % (Simultaneous filing. User may not have seen previous data.)  O2 Device Nasal Cannula  O2 Flow Rate (L/min) 3 L/min  Patient Activity (if Appropriate) In bed  Pulse Oximetry Type Intermittent  Oximetry Probe Site Changed No  During Treatment Monitoring  Duration of HD Treatment -hour(s) 2.21 hour(s)  Cumulative Fluid Removed (mL) per Treatment  1457.21 (Simultaneous filing. User may not have seen previous data.)  HD Safety Checks Performed Yes (Simultaneous filing. User may not have seen previous data.)  Intra-Hemodialysis Comments See progress note (Terminated by patient due to ride being here. AMA paperwork signed.)  Dialysis Fluid Bolus Normal Saline  Bolus Amount (mL) 300 mL  Post Treatment  Dialyzer Clearance Lightly streaked  Liters Processed 53  Fluid Removed (mL) 1600 mL  Tolerated HD Treatment Yes  AVG/AVF Arterial Site Held (minutes) 7 minutes  AVG/AVF Venous Site Held (minutes) 7 minutes  Fistula / Graft Left Upper arm Arteriovenous vein graft  Placement Date/Time: (c) 04/22/23 (c) 1000   Orientation: Left  Access Location: Upper arm  Access Type: Arteriovenous vein graft  Status Deaccessed     Stacie Glaze LPN [facility]

## 2024-01-09 NOTE — ED Provider Notes (Signed)
Jeffrey [name] Provider Note   CSN: [id number] Arrival date & time: 01/09/24  4098     History  Chief Complaint  Patient presents with   Vascular Access Problem    RICO MASSAR is a 63 y.o. male.  Patient is here requesting dialysis.  Patient states that he is having really bad pain in his neck today.  Patient states that he is scheduled for evaluation of his neck on the 17th.  He is hoping to get some relief.  Patient states that he did not feel like getting out of bed today.  He states he does not think he has any increased fluid.  Patient has not had any fever or chills.  He denies any cough or congestion he has not had any flu or COVID exposures        Home Medications Prior to Admission medications   Medication Sig Start Date End Date Taking? Authorizing Provider  acetaminophen (TYLENOL) 325 MG tablet Take 2 tablets (650 mg total) by mouth every 6 (six) hours. 07/23/23   Lockie Mola, MD  amLODipine (NORVASC) 10 MG tablet Take 10 mg by mouth every morning. 04/17/20   [provider]  ASPIRIN LOW DOSE 81 MG EC tablet Take 81 mg by mouth in the morning. 02/22/21   [provider]  atorvastatin (LIPITOR) 40 MG tablet Take 40 mg by mouth in the morning. 02/09/21   [provider]  calcium acetate (PHOSLO) 667 MG capsule Take 2 capsules (1,334 mg total) by mouth with breakfast, with lunch, and with evening meal. 12/21/23   Ejigiri, Megan Mans, PA-C  carvedilol (COREG) 12.5 MG tablet Take 12.5 mg by mouth 2 (two) times daily with a meal. 04/17/20   [provider]  clopidogrel (PLAVIX) 75 MG tablet Take 1 tablet (75 mg total) by mouth daily. 11/02/22   Rhyne, Ames Coupe, PA-C  diazepam (VALIUM) 5 MG tablet Take 5 mg by mouth 2 (two) times daily as needed for muscle spasms. 12/09/21   [provider]  Fluticasone-Umeclidin-Vilant (TRELEGY ELLIPTA IN) Inhale 1 puff into the lungs daily.    [provider]  insulin lispro (HUMALOG KWIKPEN) 100 UNIT/ML KwikPen Inject 0-9 Units into the skin 3 (three) times daily with meals. CBG < 70: treat low blood sugar CBG 70 - 120: 0 units  CBG 121 - 150: 1 unit  CBG 151 - 200: 2 units  CBG 201 - 250: 3 units  CBG 251 - 300: 5 units  CBG 301 - 350: 7 units  CBG 351 - 400: 9 units  CBG > 400: call MD 09/27/23   Zigmund Daniel., MD  insulin regular human CONCENTRATED (HUMULIN R U-500 KWIKPEN) 500 UNIT/ML KwikPen Inject 55 Units into the skin 2 (two) times daily with a meal. Follow up with your PCP and [facility] for additional adjustments 09/27/23 10/27/23  Zigmund Daniel., MD  iron polysaccharides (NIFEREX) 150 MG capsule Take 1 capsule (150 mg total) by mouth daily. 09/26/23   Meredeth Ide, MD  nortriptyline (PAMELOR) 10 MG capsule Take 20 mg by mouth at bedtime.    [provider]  pantoprazole (PROTONIX) 40 MG tablet Take 40 mg by mouth daily before breakfast. 02/09/21   [provider]  pregabalin (LYRICA) 75 MG capsule Take 1 capsule (75 mg total) by mouth See admin instructions. Daily.  Give after dialysis on dialysis days. Patient taking differently: Take 75 mg by  mouth daily. 01/21/23   [name], MD  sertraline (ZOLOFT) 100 MG tablet Take 100 mg by mouth in the morning. 02/22/21   [provider]  tamsulosin (FLOMAX) 0.4 MG CAPS capsule Take 1 capsule (0.4 mg total) by mouth daily. 11/10/21   [name], MD  torsemide (DEMADEX) 20 MG tablet Take 120 mg by mouth 2 (two) times daily.    [provider]  traZODone (DESYREL) 150 MG tablet Take 150 mg by mouth at bedtime. 12/09/21   [provider]  Vitamin D, Ergocalciferol, (DRISDOL) 1.25 MG (50000 UNIT) CAPS capsule Take 1 capsule (50,000 Units total) by mouth every 7 (seven) days. 10/02/23   Meredeth Ide, MD      Allergies    Actos [pioglitazone], Dexmedetomidine, Ibuprofen, Tomato, and Wellbutrin [bupropion]    Review of  Systems   Review of Systems  All other systems reviewed and are negative.   Physical Exam Updated Vital Signs BP (!) 140/71 (BP Location: Right Arm)   Pulse 94   Temp 98.6 F (37 C) (Oral)   Resp 20   Ht 5\' 5"  (1.651 m)   Wt 129.7 kg   SpO2 99%   BMI 47.59 kg/m  Physical Exam Vitals and nursing note reviewed.  Constitutional:      General: He is not in acute distress.    Appearance: He is well-developed.  HENT:     Head: Normocephalic and atraumatic.  Eyes:     Conjunctiva/sclera: Conjunctivae normal.  Cardiovascular:     Rate and Rhythm: Normal rate and regular rhythm.  Pulmonary:     Effort: Pulmonary effort is normal.     Breath sounds: Normal breath sounds.  Abdominal:     Tenderness: There is no abdominal tenderness.  Skin:    General: Skin is warm and dry.  Neurological:     Mental Status: He is alert.  Psychiatric:        Mood and Affect: Mood normal.     ED Results / Procedures / Treatments   Labs (all labs ordered are listed, but only abnormal results are displayed) Labs Reviewed  BASIC METABOLIC PANEL  CBC WITH DIFFERENTIAL/PLATELET    EKG None  Radiology No results found.  Procedures Procedures    Medications Ordered in ED Medications  HYDROmorphone (DILAUDID) injection 1 mg (has no administration in time range)    ED Course/ Medical Decision Making/ A&P                                 Medical Decision Making Patient is here for dialysis  Amount and/or Complexity of Data Reviewed Labs: ordered.    Details: Labs ordered reviewed and interpreted Discussion of management or test interpretation with external provider(s): I discussed patient with Dr. Marisue Humble nephrologist who will get patient set up for his dialysis today.  Risk Prescription drug management.           Final Clinical Impression(s) / ED Diagnoses Final diagnoses:  ESRD (end stage renal disease) (H[facility])  Neck pain    Rx / DC Orders ED Discharge Orders      None         Osie Cheeks 01/09/24 1610    Benjiman Core, MD 01/09/24 [phone]

## 2024-01-09 NOTE — ED Triage Notes (Signed)
Patient here for routine dialysis but also complains of his chronic neck pain.  On baseline 3L  no other complaints.

## 2024-01-09 NOTE — ED Notes (Signed)
Pt left prior to EDP reassessment

## 2024-01-11 ENCOUNTER — Other Ambulatory Visit: Payer: Self-pay

## 2024-01-11 ENCOUNTER — Emergency Department (HOSPITAL_COMMUNITY)
Admission: EM | Admit: 2024-01-11 | Discharge: 2024-01-11 | Disposition: A | Discharge status: Home / Self Care | Payer: 59 | Attending: Emergency Medicine | Admitting: Emergency Medicine

## 2024-01-11 DIAGNOSIS — E1122 Type 2 diabetes mellitus with diabetic chronic kidney disease: Secondary | ICD-10-CM | POA: Insufficient documentation

## 2024-01-11 DIAGNOSIS — Z992 Dependence on renal dialysis: Secondary | ICD-10-CM | POA: Insufficient documentation

## 2024-01-11 DIAGNOSIS — I509 Heart failure, unspecified: Secondary | ICD-10-CM | POA: Diagnosis not present

## 2024-01-11 DIAGNOSIS — J449 Chronic obstructive pulmonary disease, unspecified: Secondary | ICD-10-CM | POA: Diagnosis not present

## 2024-01-11 DIAGNOSIS — M542 Cervicalgia: Secondary | ICD-10-CM | POA: Insufficient documentation

## 2024-01-11 DIAGNOSIS — Z794 Long term (current) use of insulin: Secondary | ICD-10-CM | POA: Diagnosis not present

## 2024-01-11 DIAGNOSIS — Z7982 Long term (current) use of aspirin: Secondary | ICD-10-CM | POA: Insufficient documentation

## 2024-01-11 DIAGNOSIS — I132 Hypertensive heart and chronic kidney disease with heart failure and with stage 5 chronic kidney disease, or end stage renal disease: Secondary | ICD-10-CM | POA: Diagnosis not present

## 2024-01-11 DIAGNOSIS — Z79899 Other long term (current) drug therapy: Secondary | ICD-10-CM | POA: Insufficient documentation

## 2024-01-11 DIAGNOSIS — Z7902 Long term (current) use of antithrombotics/antiplatelets: Secondary | ICD-10-CM | POA: Diagnosis not present

## 2024-01-11 DIAGNOSIS — N186 End stage renal disease: Secondary | ICD-10-CM | POA: Insufficient documentation

## 2024-01-11 DIAGNOSIS — Z7951 Long term (current) use of inhaled steroids: Secondary | ICD-10-CM | POA: Insufficient documentation

## 2024-01-11 LAB — RENAL FUNCTION PANEL
Albumin: 3.6 g/dL (ref 3.5–5.0)
Anion gap: 10 (ref 5–15)
BUN: 68 mg/dL — ABNORMAL HIGH (ref 8–23)
CO2: 24 mmol/L (ref 22–32)
Calcium: 8 mg/dL — ABNORMAL LOW (ref 8.9–10.3)
Chloride: 100 mmol/L (ref 98–111)
Creatinine, Ser: 4.63 mg/dL — ABNORMAL HIGH (ref 0.61–1.24)
GFR, Estimated: 13 mL/min — ABNORMAL LOW (ref >60)
Glucose, Bld: 199 mg/dL — ABNORMAL HIGH (ref 70–99)
Phosphorus: 3.5 mg/dL (ref 2.5–4.6)
Potassium: 4.2 mmol/L (ref 3.5–5.1)
Sodium: 134 mmol/L — ABNORMAL LOW (ref 135–145)

## 2024-01-11 LAB — CBC
HCT: 28.5 % — ABNORMAL LOW (ref 39.0–52.0)
Hemoglobin: 9 g/dL — ABNORMAL LOW (ref 13.0–17.0)
MCH: 28.4 pg (ref 26.0–34.0)
MCHC: 31.6 g/dL (ref 30.0–36.0)
MCV: 89.9 fL (ref 80.0–100.0)
Platelets: 140 10*3/uL — ABNORMAL LOW (ref 150–400)
RBC: 3.17 MIL/uL — ABNORMAL LOW (ref 4.22–5.81)
RDW: 14.5 % (ref 11.5–15.5)
WBC: 8.4 10*3/uL (ref 4.0–10.5)
nRBC: 0.2 % (ref 0.0–0.2)

## 2024-01-11 MED ORDER — HYDROMORPHONE HCL 1 MG/ML IJ SOLN
0.5000 mg | Freq: Once | INTRAMUSCULAR | Status: AC
  Administered 2024-01-11: 0.5 mg via INTRAVENOUS
  Filled 2024-01-11: qty 0.5

## 2024-01-11 MED ORDER — CHLORHEXIDINE GLUCONATE CLOTH 2 % EX PADS: 6.0000 | MEDICATED_PAD | Freq: Every day | CUTANEOUS | Status: DC

## 2024-01-11 MED ORDER — HEPARIN SODIUM (PORCINE) 1000 UNIT/ML DIALYSIS
2000.0000 [IU] | Freq: Once | INTRAMUSCULAR | Status: AC
  Administered 2024-01-11: 2000 [IU] via INTRAVENOUS_CENTRAL
  Filled 2024-01-11: qty 2

## 2024-01-11 MED ORDER — OXYCODONE-ACETAMINOPHEN 5-325 MG PO TABS
1.0000 | ORAL_TABLET | Freq: Once | ORAL | Status: AC
  Administered 2024-01-11: 1 via ORAL
  Filled 2024-01-11: qty 1

## 2024-01-11 NOTE — ED Notes (Signed)
Pt returned to the unit & left after telling all the staff "goodbye" & did not wait for updated v/s or d/c papers.

## 2024-01-11 NOTE — ED Provider Notes (Signed)
[place] EMERGENCY DEPARTMENT AT [facility] Provider Note   CSN: [id number] Arrival date & time: 01/11/24  4098     History {Add pertinent medical, surgical, social history, OB history to HPI:1} Chief Complaint  Patient presents with   Vascular Access Problem   Neck Pain    Jeffrey [name] is a 63 y.o. male.  HPI     Home Medications Prior to Admission medications   Medication Sig Start Date End Date Taking? Authorizing Provider  acetaminophen (TYLENOL) 325 MG tablet Take 2 tablets (650 mg total) by mouth every 6 (six) hours. 07/23/23   Lockie Mola, MD  amLODipine (NORVASC) 10 MG tablet Take 10 mg by mouth every morning. 04/17/20   [provider]  ASPIRIN LOW DOSE 81 MG EC tablet Take 81 mg by mouth in the morning. 02/22/21   [provider]  atorvastatin (LIPITOR) 40 MG tablet Take 40 mg by mouth in the morning. 02/09/21   [provider]  calcium acetate (PHOSLO) 667 MG capsule Take 2 capsules (1,334 mg total) by mouth with breakfast, with lunch, and with evening meal. 12/21/23   Ejigiri, Megan Mans, PA-C  carvedilol (COREG) 12.5 MG tablet Take 12.5 mg by mouth 2 (two) times daily with a meal. 04/17/20   [provider]  clopidogrel (PLAVIX) 75 MG tablet Take 1 tablet (75 mg total) by mouth daily. 11/02/22   Rhyne, Ames Coupe, PA-C  diazepam (VALIUM) 5 MG tablet Take 5 mg by mouth 2 (two) times daily as needed for muscle spasms. 12/09/21   [provider]  Fluticasone-Umeclidin-Vilant (TRELEGY ELLIPTA IN) Inhale 1 puff into the lungs daily.    [provider]  insulin lispro (HUMALOG KWIKPEN) 100 UNIT/ML KwikPen Inject 0-9 Units into the skin 3 (three) times daily with meals. CBG < 70: treat low blood sugar CBG 70 - 120: 0 units  CBG 121 - 150: 1 unit  CBG 151 - 200: 2 units  CBG 201 - 250: 3 units  CBG 251 - 300: 5 units  CBG 301 - 350: 7 units  CBG 351 - 400: 9 units  CBG > 400: call MD 09/27/23   Zigmund Daniel., MD  insulin regular human CONCENTRATED (HUMULIN R U-500 KWIKPEN) 500 UNIT/ML KwikPen Inject 55 Units into the skin 2 (two) times daily with a meal. Follow up with your PCP and [facility] for additional adjustments 09/27/23 10/27/23  Zigmund Daniel., MD  iron polysaccharides (NIFEREX) 150 MG capsule Take 1 capsule (150 mg total) by mouth daily. 09/26/23   Meredeth Ide, MD  nortriptyline (PAMELOR) 10 MG capsule Take 20 mg by mouth at bedtime.    [provider]  pantoprazole (PROTONIX) 40 MG tablet Take 40 mg by mouth daily before breakfast. 02/09/21   [provider]  pregabalin (LYRICA) 75 MG capsule Take 1 capsule (75 mg total) by mouth See admin instructions. Daily.  Give after dialysis on dialysis days. Patient taking differently: Take 75 mg by mouth daily. 01/21/23   [name], MD  sertraline (ZOLOFT) 100 MG tablet Take 100 mg by mouth in the morning. 02/22/21   [provider]  tamsulosin (FLOMAX) 0.4 MG CAPS capsule Take 1 capsule (0.4 mg total) by mouth daily. 11/10/21   [name], MD  torsemide (DEMADEX) 20 MG tablet Take 120 mg by mouth 2 (two) times daily.    [provider]  traZODone (DESYREL) 150 MG tablet Take 150 mg by mouth  at bedtime. 12/09/21   [provider]  Vitamin D, Ergocalciferol, (DRISDOL) 1.25 MG (50000 UNIT) CAPS capsule Take 1 capsule (50,000 Units total) by mouth every 7 (seven) days. 10/02/23   Meredeth Ide, MD      Allergies    Actos [pioglitazone], Dexmedetomidine, Ibuprofen, Tomato, and Wellbutrin [bupropion]    Review of Systems   Review of Systems  Physical Exam Updated Vital Signs BP (!) 157/67 (BP Location: Right Arm)   Pulse 78   Temp 98.3 F (36.8 C)   Resp 18   SpO2 97%  Physical Exam  ED Results / Procedures / Treatments   Labs (all labs ordered are listed, but only abnormal results are displayed) Labs Reviewed - No data to display  EKG None  Radiology No results  found.  Procedures Procedures  {Document cardiac monitor, telemetry assessment procedure when appropriate:1}  Medications Ordered in ED Medications  oxyCODONE-acetaminophen (PERCOCET/ROXICET) 5-325 MG per tablet 1 tablet (has no administration in time range)  Chlorhexidine Gluconate Cloth 2 % PADS 6 each (has no administration in time range)    ED Course/ Medical Decision Making/ A&P   {   Click here for ABCD2, HEART and other calculatorsREFRESH Note before signing :1}                              Medical Decision Making  ***  {Document critical care time when appropriate:1} {Document review of labs and clinical decision tools ie heart score, Chads2Vasc2 etc:1}  {Document your independent review of radiology images, and any outside records:1} {Document your discussion with family members, caretakers, and with consultants:1} {Document social determinants of health affecting pt's care:1} {Document your decision making why or why not admission, treatments were needed:1} Final Clinical Impression(s) / ED Diagnoses Final diagnoses:  None    Rx / DC Orders ED Discharge Orders     None

## 2024-01-11 NOTE — Progress Notes (Signed)
   01/11/24 1602  Vitals  Temp 98.2 F (36.8 C)  Pulse Rate 85  Resp 12  BP (!) 131/59  SpO2 100 %  O2 Device Room Air  Post Treatment  Dialyzer Clearance Heavily streaked  Liters Processed 58  Fluid Removed (mL) 1200 mL  Tolerated HD Treatment Yes  AVG/AVF Arterial Site Held (minutes) 8 minutes  AVG/AVF Venous Site Held (minutes) 8 minutes  During Treatment Monitoring  Duration of HD Treatment -hour(s) 2.42 hour(s)   Received patient in bed to unit.  Alert and oriented.  Informed consent signed and in chart.   TX duration:2.25 HOURS Patient requested to end tx early due to transportation issues. AMA signed by patient.   Patient tolerated well.  Transported back to the room  Alert, without acute distress.  Hand-off given to patient's nurse.   Access used: L AVF Access issues: None  Total UF removed: Medication(s) given: See MAR    Laqueta Due, RN [facility]
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
## 2024-01-13 ENCOUNTER — Encounter (HOSPITAL_COMMUNITY): Payer: Self-pay

## 2024-01-13 ENCOUNTER — Other Ambulatory Visit: Payer: Self-pay

## 2024-01-13 ENCOUNTER — Emergency Department (HOSPITAL_COMMUNITY)
Admission: EM | Admit: 2024-01-13 | Discharge: 2024-01-13 | Discharge status: Against Medical Advice | Payer: 59 | Attending: Emergency Medicine | Admitting: Emergency Medicine

## 2024-01-13 DIAGNOSIS — N186 End stage renal disease: Secondary | ICD-10-CM | POA: Insufficient documentation

## 2024-01-13 DIAGNOSIS — E1122 Type 2 diabetes mellitus with diabetic chronic kidney disease: Secondary | ICD-10-CM | POA: Insufficient documentation

## 2024-01-13 DIAGNOSIS — Z794 Long term (current) use of insulin: Secondary | ICD-10-CM | POA: Diagnosis not present

## 2024-01-13 DIAGNOSIS — Z7982 Long term (current) use of aspirin: Secondary | ICD-10-CM | POA: Insufficient documentation

## 2024-01-13 DIAGNOSIS — I5032 Chronic diastolic (congestive) heart failure: Secondary | ICD-10-CM | POA: Insufficient documentation

## 2024-01-13 DIAGNOSIS — Z992 Dependence on renal dialysis: Secondary | ICD-10-CM | POA: Insufficient documentation

## 2024-01-13 DIAGNOSIS — Z7951 Long term (current) use of inhaled steroids: Secondary | ICD-10-CM | POA: Insufficient documentation

## 2024-01-13 DIAGNOSIS — J449 Chronic obstructive pulmonary disease, unspecified: Secondary | ICD-10-CM | POA: Diagnosis not present

## 2024-01-13 LAB — CBC WITH DIFFERENTIAL/PLATELET
Abs Immature Granulocytes: 0.17 10*3/uL — ABNORMAL HIGH (ref 0.00–0.07)
Basophils Absolute: 0 10*3/uL (ref 0.0–0.1)
Basophils Relative: 0 %
Eosinophils Absolute: 0.1 10*3/uL (ref 0.0–0.5)
Eosinophils Relative: 2 %
HCT: 28.7 % — ABNORMAL LOW (ref 39.0–52.0)
Hemoglobin: 8.9 g/dL — ABNORMAL LOW (ref 13.0–17.0)
Immature Granulocytes: 2 %
Lymphocytes Relative: 10 %
Lymphs Abs: 0.8 10*3/uL (ref 0.7–4.0)
MCH: 28.8 pg (ref 26.0–34.0)
MCHC: 31 g/dL (ref 30.0–36.0)
MCV: 92.9 fL (ref 80.0–100.0)
Monocytes Absolute: 0.5 10*3/uL (ref 0.1–1.0)
Monocytes Relative: 7 %
Neutro Abs: 6.3 10*3/uL (ref 1.7–7.7)
Neutrophils Relative %: 79 %
Platelets: 124 10*3/uL — ABNORMAL LOW (ref 150–400)
RBC: 3.09 MIL/uL — ABNORMAL LOW (ref 4.22–5.81)
RDW: 14.9 % (ref 11.5–15.5)
WBC: 8 10*3/uL (ref 4.0–10.5)
nRBC: 0.3 % — ABNORMAL HIGH (ref 0.0–0.2)

## 2024-01-13 LAB — IRON AND TIBC
Iron: 58 ug/dL (ref 45–182)
Saturation Ratios: 24 % (ref 17.9–39.5)
TIBC: 245 ug/dL — ABNORMAL LOW (ref 250–450)
UIBC: 187 ug/dL

## 2024-01-13 LAB — RENAL FUNCTION PANEL
Albumin: 3.6 g/dL (ref 3.5–5.0)
Anion gap: 12 (ref 5–15)
BUN: 78 mg/dL — ABNORMAL HIGH (ref 8–23)
CO2: 23 mmol/L (ref 22–32)
Calcium: 8.3 mg/dL — ABNORMAL LOW (ref 8.9–10.3)
Chloride: 96 mmol/L — ABNORMAL LOW (ref 98–111)
Creatinine, Ser: 6 mg/dL — ABNORMAL HIGH (ref 0.61–1.24)
GFR, Estimated: 10 mL/min — ABNORMAL LOW (ref >60)
Glucose, Bld: 434 mg/dL — ABNORMAL HIGH (ref 70–99)
Phosphorus: 4.7 mg/dL — ABNORMAL HIGH (ref 2.5–4.6)
Potassium: 4.6 mmol/L (ref 3.5–5.1)
Sodium: 131 mmol/L — ABNORMAL LOW (ref 135–145)

## 2024-01-13 LAB — FERRITIN: Ferritin: 762 ng/mL — ABNORMAL HIGH (ref 24–336)

## 2024-01-13 MED ORDER — CHLORHEXIDINE GLUCONATE CLOTH 2 % EX PADS: 6.0000 | MEDICATED_PAD | Freq: Every day | CUTANEOUS | Status: DC

## 2024-01-13 MED ORDER — HEPARIN SODIUM (PORCINE) 1000 UNIT/ML IJ SOLN
2000.0000 [IU] | Freq: Once | INTRAMUSCULAR | Status: AC
  Administered 2024-01-13: 2000 [IU] via INTRAVENOUS

## 2024-01-13 MED ORDER — HYDROCODONE-ACETAMINOPHEN 5-325 MG PO TABS
1.0000 | ORAL_TABLET | Freq: Once | ORAL | Status: AC
  Administered 2024-01-13: 1 via ORAL
  Filled 2024-01-13: qty 1

## 2024-01-13 MED ORDER — HEPARIN SODIUM (PORCINE) 1000 UNIT/ML IJ SOLN
INTRAMUSCULAR | Status: AC
  Filled 2024-01-13: qty 2
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
## 2024-01-13 NOTE — Procedures (Signed)
I was present at the procedure, reviewed the HD regimen and made appropriate changes.   Vinson Moselle MD  [facility] 01/13/2024, 3:27 PM

## 2024-01-13 NOTE — ED Triage Notes (Signed)
Pt here for dialysis, did not get full treatment on Thursday.

## 2024-01-13 NOTE — ED Notes (Signed)
 Pt to dialysis w/ transport
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
## 2024-01-13 NOTE — Progress Notes (Signed)
Jeffrey [name] is here for his hospital dialysis. He was kicked out from his outpatient HD center due to behavioral issues. Patient is not being admitted. The plan will be for "ED HD". Pt will go to the dialysis unit upstairs when they are ready for the patient. When dialysis is completed pt will be sent back to ED for reassessment.    Last OP HD orders (dec 2023):  3:15h  129kg  RUE AVG   Heparin 2000    Salome Holmes, NP [employer]

## 2024-01-18 ENCOUNTER — Emergency Department (HOSPITAL_COMMUNITY)
Admission: EM | Admit: 2024-01-18 | Discharge: 2024-01-18 | Discharge status: Against Medical Advice | Payer: 59 | Attending: Emergency Medicine | Admitting: Emergency Medicine

## 2024-01-18 DIAGNOSIS — Z794 Long term (current) use of insulin: Secondary | ICD-10-CM | POA: Diagnosis not present

## 2024-01-18 DIAGNOSIS — D631 Anemia in chronic kidney disease: Secondary | ICD-10-CM | POA: Insufficient documentation

## 2024-01-18 DIAGNOSIS — Z7982 Long term (current) use of aspirin: Secondary | ICD-10-CM | POA: Diagnosis not present

## 2024-01-18 DIAGNOSIS — Z992 Dependence on renal dialysis: Secondary | ICD-10-CM

## 2024-01-18 DIAGNOSIS — Z7902 Long term (current) use of antithrombotics/antiplatelets: Secondary | ICD-10-CM | POA: Diagnosis not present

## 2024-01-18 DIAGNOSIS — Z5329 Procedure and treatment not carried out because of patient's decision for other reasons: Secondary | ICD-10-CM | POA: Insufficient documentation

## 2024-01-18 DIAGNOSIS — N186 End stage renal disease: Secondary | ICD-10-CM | POA: Insufficient documentation

## 2024-01-18 MED ORDER — DARBEPOETIN ALFA 200 MCG/0.4ML IJ SOSY
200.0000 ug | PREFILLED_SYRINGE | Freq: Once | INTRAMUSCULAR | Status: AC
  Administered 2024-01-18: 200 ug via SUBCUTANEOUS
  Filled 2024-01-18: qty 0.4

## 2024-01-18 MED ORDER — CHLORHEXIDINE GLUCONATE CLOTH 2 % EX PADS
6.0000 | MEDICATED_PAD | Freq: Every day | CUTANEOUS | Status: DC
Start: 2024-01-18 — End: 2024-01-18

## 2024-01-18 MED ORDER — OXYCODONE-ACETAMINOPHEN 5-325 MG PO TABS
1.0000 | ORAL_TABLET | Freq: Once | ORAL | Status: AC
  Administered 2024-01-18: 1 via ORAL
  Filled 2024-01-18: qty 1

## 2024-01-18 MED ORDER — HEPARIN SODIUM (PORCINE) 1000 UNIT/ML IJ SOLN
2000.0000 [IU] | Freq: Once | INTRAMUSCULAR | Status: AC
  Administered 2024-01-18: 2000 [IU]

## 2024-01-18 NOTE — ED Notes (Signed)
Jeffrey [name]'s chair is a loaner and isn't holding a charge.  He has called the company to let them know and figure out what to do.  The chair is in the EMS room until he comes back down.

## 2024-01-18 NOTE — Procedures (Signed)
Asked to see patient for hospital dialysis. Patient is not being admitted. The plan will be for "ED HD". Pt will go to the dialysis unit upstairs when HD is ready, and when dialysis is completed pt will be sent back to ED for reassessment.      Last OP HD orders (dec 2023):  3:15h  129kg  RUE AVG   Heparin 2000       Clinical issues: 1) Anemia of esrd:  ** Pt dose not have an outpatient nephrologist or HD unit so does not have access to ESA and other needed medications.  ** pt had darbe in spring 2024 May, but Hb continued to drop, so dosing of darbepoeitin was increased up to 100 and then 150 mcg q 1-2 wks.  Also pt required two 1 gm IV Fe loading programs during the summer and early fall to get the Hb to respond up to Hb 8-10. In Sept / Oct 2024, Hb dropped down back into the 8's and so dose was ^'d to 200 mcg (every 1-2 wks). In Feb 2025 Hb is in the low 9s. Plan for today -->    Plan: Last dose of darbepoetin 200 mcg was 2/06. Due today, will order SQ to be given in ED Darbepoeitin needs to ordered as "once"  or "once in dialysis". Otherwise the order will default to "SQ at 1800" but that timing is not good because he typically needs to be ready to leave at 5 pm to catch his ride home.   I was present at the procedure, reviewed the HD regimen and made appropriate changes.   Vinson Moselle MD  [facility] 01/18/2024, 11:08 AM

## 2024-01-18 NOTE — ED Provider Notes (Signed)
[place] EMERGENCY DEPARTMENT AT [facility] Provider Note   CSN: [id number] Arrival date & time: 01/18/24  6962     History  Chief Complaint  Patient presents with   Needs Dialysis    Jeffrey [name] is a 63 y.o. male.  63 year old male here today for his regularly scheduled dialysis.  He has no other complaints.        Home Medications Prior to Admission medications   Medication Sig Start Date End Date Taking? Authorizing Provider  acetaminophen (TYLENOL) 325 MG tablet Take 2 tablets (650 mg total) by mouth every 6 (six) hours. 07/23/23   Lockie Mola, MD  amLODipine (NORVASC) 10 MG tablet Take 10 mg by mouth every morning. 04/17/20   [provider]  ASPIRIN LOW DOSE 81 MG EC tablet Take 81 mg by mouth in the morning. 02/22/21   [provider]  atorvastatin (LIPITOR) 40 MG tablet Take 40 mg by mouth in the morning. 02/09/21   [provider]  calcium acetate (PHOSLO) 667 MG capsule Take 2 capsules (1,334 mg total) by mouth with breakfast, with lunch, and with evening meal. 12/21/23   Ejigiri, Megan Mans, PA-C  carvedilol (COREG) 12.5 MG tablet Take 12.5 mg by mouth 2 (two) times daily with a meal. 04/17/20   [provider]  clopidogrel (PLAVIX) 75 MG tablet Take 1 tablet (75 mg total) by mouth daily. 11/02/22   Rhyne, Ames Coupe, PA-C  diazepam (VALIUM) 5 MG tablet Take 5 mg by mouth 2 (two) times daily as needed for muscle spasms. 12/09/21   [provider]  Fluticasone-Umeclidin-Vilant (TRELEGY ELLIPTA IN) Inhale 1 puff into the lungs daily.    [provider]  insulin lispro (HUMALOG KWIKPEN) 100 UNIT/ML KwikPen Inject 0-9 Units into the skin 3 (three) times daily with meals. CBG < 70: treat low blood sugar CBG 70 - 120: 0 units  CBG 121 - 150: 1 unit  CBG 151 - 200: 2 units  CBG 201 - 250: 3 units  CBG 251 - 300: 5 units  CBG 301 - 350: 7 units  CBG 351 - 400: 9 units  CBG > 400: call MD 09/27/23    Zigmund Daniel., MD  insulin regular human CONCENTRATED (HUMULIN R U-500 KWIKPEN) 500 UNIT/ML KwikPen Inject 55 Units into the skin 2 (two) times daily with a meal. Follow up with your PCP and [facility] for additional adjustments 09/27/23 10/27/23  Zigmund Daniel., MD  iron polysaccharides (NIFEREX) 150 MG capsule Take 1 capsule (150 mg total) by mouth daily. 09/26/23   Meredeth Ide, MD  nortriptyline (PAMELOR) 10 MG capsule Take 20 mg by mouth at bedtime.    [provider]  pantoprazole (PROTONIX) 40 MG tablet Take 40 mg by mouth daily before breakfast. 02/09/21   [provider]  pregabalin (LYRICA) 75 MG capsule Take 1 capsule (75 mg total) by mouth See admin instructions. Daily.  Give after dialysis on dialysis days. Patient taking differently: Take 75 mg by mouth daily. 01/21/23   [name], MD  sertraline (ZOLOFT) 100 MG tablet Take 100 mg by mouth in the morning. 02/22/21   [provider]  tamsulosin (FLOMAX) 0.4 MG CAPS capsule Take 1 capsule (0.4 mg total) by mouth daily. 11/10/21   [name], MD  torsemide (DEMADEX) 20 MG tablet Take 120 mg by mouth 2 (two) times daily.    [provider]  traZODone (DESYREL) 150 MG tablet Take 150  mg by mouth at bedtime. 12/09/21   [provider]  Vitamin D, Ergocalciferol, (DRISDOL) 1.25 MG (50000 UNIT) CAPS capsule Take 1 capsule (50,000 Units total) by mouth every 7 (seven) days. 10/02/23   Meredeth Ide, MD      Allergies    Actos [pioglitazone], Dexmedetomidine, Ibuprofen, Tomato, and Wellbutrin [bupropion]    Review of Systems   Review of Systems  Physical Exam Updated Vital Signs BP (!) 140/73 (BP Location: Right Wrist)   Pulse 91   Temp 97.9 F (36.6 C)   Resp 17   SpO2 100%  Physical Exam Vitals and nursing note reviewed.  Pulmonary:     Effort: Pulmonary effort is normal.  Neurological:     Mental Status: He is alert. Mental status is at baseline.     ED  Results / Procedures / Treatments   Labs (all labs ordered are listed, but only abnormal results are displayed) Labs Reviewed  CBC  BASIC METABOLIC PANEL  HEPATITIS B SURFACE ANTIGEN  HEPATITIS B SURFACE ANTIBODY, QUANTITATIVE    EKG None  Radiology No results found.  Procedures Procedures    Medications Ordered in ED Medications  Chlorhexidine Gluconate Cloth 2 % PADS 6 each (has no administration in time range)    ED Course/ Medical Decision Making/ A&P                                 Medical Decision Making 63 year old male here for dialysis.  Plan-paged renal.  This patient's health care is complicated by the following social determinants of health-multiple medical comorbidities including end-stage renal disease, lack of access to primary care.  Amount and/or Complexity of Data Reviewed Labs: ordered.           Final Clinical Impression(s) / ED Diagnoses Final diagnoses:  Dialysis patient [facility])    Rx / DC Orders ED Discharge Orders     None         Anders Simmonds T, DO 01/18/24 [phone]

## 2024-01-18 NOTE — ED Triage Notes (Signed)
Pt here for dialysis. No other complaints 

## 2024-01-18 NOTE — Progress Notes (Signed)
Received patient in ED stretcher bed. Awake,laert and oriented x 4. He signed his treatment's consent.  Access used : Left AVG that worked well.  Duration f treatment : 3.15 hours.  UF goal : 2.2 liters  Tolerated treatment:No ,he quit on his last 30 minutes due to his generalized muscles cramping thus 2.2 liters of net uf from him .  Medicine given : Heparin 2000 pre -run dose.  Medicine given : Percocet I tab.  Hand off to the patient's nurse: Back to ED with stable medical condition via transporter.

## 2024-01-20 ENCOUNTER — Emergency Department (HOSPITAL_COMMUNITY)
Admission: EM | Admit: 2024-01-20 | Discharge: 2024-01-20 | Discharge status: Against Medical Advice | Payer: 59 | Attending: Emergency Medicine | Admitting: Emergency Medicine

## 2024-01-20 ENCOUNTER — Other Ambulatory Visit: Payer: Self-pay

## 2024-01-20 DIAGNOSIS — I120 Hypertensive chronic kidney disease with stage 5 chronic kidney disease or end stage renal disease: Secondary | ICD-10-CM | POA: Insufficient documentation

## 2024-01-20 DIAGNOSIS — Z79899 Other long term (current) drug therapy: Secondary | ICD-10-CM | POA: Diagnosis not present

## 2024-01-20 DIAGNOSIS — J449 Chronic obstructive pulmonary disease, unspecified: Secondary | ICD-10-CM | POA: Diagnosis not present

## 2024-01-20 DIAGNOSIS — Z7982 Long term (current) use of aspirin: Secondary | ICD-10-CM | POA: Diagnosis not present

## 2024-01-20 DIAGNOSIS — Z992 Dependence on renal dialysis: Secondary | ICD-10-CM | POA: Insufficient documentation

## 2024-01-20 DIAGNOSIS — N186 End stage renal disease: Secondary | ICD-10-CM | POA: Insufficient documentation

## 2024-01-20 DIAGNOSIS — E1122 Type 2 diabetes mellitus with diabetic chronic kidney disease: Secondary | ICD-10-CM | POA: Insufficient documentation

## 2024-01-20 DIAGNOSIS — Z7902 Long term (current) use of antithrombotics/antiplatelets: Secondary | ICD-10-CM | POA: Diagnosis not present

## 2024-01-20 LAB — RENAL FUNCTION PANEL
Albumin: 3.8 g/dL (ref 3.5–5.0)
Anion gap: 15 (ref 5–15)
BUN: 84 mg/dL — ABNORMAL HIGH (ref 8–23)
CO2: 24 mmol/L (ref 22–32)
Calcium: 7.8 mg/dL — ABNORMAL LOW (ref 8.9–10.3)
Chloride: 100 mmol/L (ref 98–111)
Creatinine, Ser: 5.62 mg/dL — ABNORMAL HIGH (ref 0.61–1.24)
GFR, Estimated: 11 mL/min — ABNORMAL LOW (ref >60)
Glucose, Bld: 135 mg/dL — ABNORMAL HIGH (ref 70–99)
Phosphorus: 5.1 mg/dL — ABNORMAL HIGH (ref 2.5–4.6)
Potassium: 3.9 mmol/L (ref 3.5–5.1)
Sodium: 139 mmol/L (ref 135–145)

## 2024-01-20 LAB — CBC
HCT: 30.8 % — ABNORMAL LOW (ref 39.0–52.0)
Hemoglobin: 9.6 g/dL — ABNORMAL LOW (ref 13.0–17.0)
MCH: 28.9 pg (ref 26.0–34.0)
MCHC: 31.2 g/dL (ref 30.0–36.0)
MCV: 92.8 fL (ref 80.0–100.0)
Platelets: 93 10*3/uL — ABNORMAL LOW (ref 150–400)
RBC: 3.32 MIL/uL — ABNORMAL LOW (ref 4.22–5.81)
RDW: 15.7 % — ABNORMAL HIGH (ref 11.5–15.5)
WBC: 5.5 10*3/uL (ref 4.0–10.5)
nRBC: 0 % (ref 0.0–0.2)

## 2024-01-20 LAB — HEPATITIS B SURFACE ANTIGEN: Hepatitis B Surface Ag: NONREACTIVE

## 2024-01-20 MED ORDER — HYDROMORPHONE HCL 1 MG/ML IJ SOLN
1.0000 mg | Freq: Once | INTRAMUSCULAR | Status: AC
  Administered 2024-01-20: 1 mg via INTRAMUSCULAR
  Filled 2024-01-20: qty 1

## 2024-01-20 MED ORDER — HEPARIN SODIUM (PORCINE) 1000 UNIT/ML DIALYSIS
2000.0000 [IU] | Freq: Once | INTRAMUSCULAR | Status: AC
  Administered 2024-01-20: 2000 [IU] via INTRAVENOUS_CENTRAL
  Filled 2024-01-20: qty 2

## 2024-01-20 MED ORDER — CHLORHEXIDINE GLUCONATE CLOTH 2 % EX PADS: 6.0000 | MEDICATED_PAD | Freq: Every day | CUTANEOUS | Status: DC

## 2024-01-20 NOTE — ED Provider Notes (Signed)
 [place] EMERGENCY DEPARTMENT AT [facility] Provider Note   CSN: [id number] Arrival date & time: 01/20/24  1191     History  No chief complaint on file.   Jeffrey [name] is a 63 y.o. male.  The history is provided by the patient and medical records. No language interpreter was used.  Illness Location:  Here for dialysis Severity:  Unable to specify Onset quality:  Unable to specify Chronicity:  Chronic Context:  Also chronic neck pain Associated symptoms: no abdominal pain, no chest pain, no congestion, no cough, no diarrhea, no fatigue, no fever, no headaches, no loss of consciousness, no nausea, no shortness of breath, no vomiting and no wheezing        Home Medications Prior to Admission medications   Medication Sig Start Date End Date Taking? Authorizing Provider  acetaminophen (TYLENOL) 325 MG tablet Take 2 tablets (650 mg total) by mouth every 6 (six) hours. 07/23/23   Lockie Mola, MD  amLODipine (NORVASC) 10 MG tablet Take 10 mg by mouth every morning. 04/17/20   [provider]  ASPIRIN LOW DOSE 81 MG EC tablet Take 81 mg by mouth in the morning. 02/22/21   [provider]  atorvastatin (LIPITOR) 40 MG tablet Take 40 mg by mouth in the morning. 02/09/21   [provider]  calcium acetate (PHOSLO) 667 MG capsule Take 2 capsules (1,334 mg total) by mouth with breakfast, with lunch, and with evening meal. 12/21/23   Ejigiri, Megan Mans, PA-C  carvedilol (COREG) 12.5 MG tablet Take 12.5 mg by mouth 2 (two) times daily with a meal. 04/17/20   [provider]  clopidogrel (PLAVIX) 75 MG tablet Take 1 tablet (75 mg total) by mouth daily. 11/02/22   Rhyne, Ames Coupe, PA-C  diazepam (VALIUM) 5 MG tablet Take 5 mg by mouth 2 (two) times daily as needed for muscle spasms. 12/09/21   [provider]  Fluticasone-Umeclidin-Vilant (TRELEGY ELLIPTA IN) Inhale 1 puff into the lungs daily.    [provider]  insulin lispro  (HUMALOG KWIKPEN) 100 UNIT/ML KwikPen Inject 0-9 Units into the skin 3 (three) times daily with meals. CBG < 70: treat low blood sugar CBG 70 - 120: 0 units  CBG 121 - 150: 1 unit  CBG 151 - 200: 2 units  CBG 201 - 250: 3 units  CBG 251 - 300: 5 units  CBG 301 - 350: 7 units  CBG 351 - 400: 9 units  CBG > 400: call MD 09/27/23   Zigmund Daniel., MD  insulin regular human CONCENTRATED (HUMULIN R U-500 KWIKPEN) 500 UNIT/ML KwikPen Inject 55 Units into the skin 2 (two) times daily with a meal. Follow up with your PCP and [facility] for additional adjustments 09/27/23 10/27/23  Zigmund Daniel., MD  iron polysaccharides (NIFEREX) 150 MG capsule Take 1 capsule (150 mg total) by mouth daily. 09/26/23   Meredeth Ide, MD  nortriptyline (PAMELOR) 10 MG capsule Take 20 mg by mouth at bedtime.    [provider]  pantoprazole (PROTONIX) 40 MG tablet Take 40 mg by mouth daily before breakfast. 02/09/21   [provider]  pregabalin (LYRICA) 75 MG capsule Take 1 capsule (75 mg total) by mouth See admin instructions. Daily.  Give after dialysis on dialysis days. Patient taking differently: Take 75 mg by mouth daily. 01/21/23   [name], MD  sertraline (ZOLOFT) 100 MG tablet Take 100 mg by mouth in the morning. 02/22/21  [provider]  tamsulosin (FLOMAX) 0.4 MG CAPS capsule Take 1 capsule (0.4 mg total) by mouth daily. 11/10/21   [name], MD  torsemide (DEMADEX) 20 MG tablet Take 120 mg by mouth 2 (two) times daily.    [provider]  traZODone (DESYREL) 150 MG tablet Take 150 mg by mouth at bedtime. 12/09/21   [provider]  Vitamin D, Ergocalciferol, (DRISDOL) 1.25 MG (50000 UNIT) CAPS capsule Take 1 capsule (50,000 Units total) by mouth every 7 (seven) days. 10/02/23   Meredeth Ide, MD      Allergies    Actos [pioglitazone], Dexmedetomidine, Ibuprofen, Tomato, and Wellbutrin [bupropion]    Review of Systems   Review of Systems   Constitutional:  Negative for chills, fatigue and fever.  HENT:  Negative for congestion.   Respiratory:  Negative for cough, chest tightness, shortness of breath and wheezing.   Cardiovascular:  Negative for chest pain.  Gastrointestinal:  Negative for abdominal pain, constipation, diarrhea, nausea and vomiting.  Musculoskeletal:  Positive for neck pain. Negative for back pain.  Neurological:  Negative for loss of consciousness and headaches.  Psychiatric/Behavioral:  Negative for agitation.   All other systems reviewed and are negative.   Physical Exam Updated Vital Signs BP (!) 156/55 (BP Location: Right Arm)   Pulse 82   Resp 16   SpO2 100%  Physical Exam Vitals and nursing note reviewed.  Constitutional:      General: He is not in acute distress.    Appearance: He is well-developed. He is not ill-appearing, toxic-appearing or diaphoretic.  HENT:     Head: Normocephalic and atraumatic.  Eyes:     Conjunctiva/sclera: Conjunctivae normal.  Cardiovascular:     Rate and Rhythm: Normal rate and regular rhythm.     Heart sounds: No murmur heard. Pulmonary:     Effort: Pulmonary effort is normal. No respiratory distress.     Breath sounds: Normal breath sounds. No wheezing, rhonchi or rales.  Chest:     Chest wall: No tenderness.  Abdominal:     Palpations: Abdomen is soft.     Tenderness: There is no abdominal tenderness.  Musculoskeletal:        General: Tenderness present. No swelling.     Cervical back: Neck supple.  Skin:    General: Skin is warm and dry.     Capillary Refill: Capillary refill takes less than 2 seconds.     Findings: No erythema or rash.  Neurological:     Mental Status: He is alert.  Psychiatric:        Mood and Affect: Mood normal.     ED Results / Procedures / Treatments   Labs (all labs ordered are listed, but only abnormal results are displayed) Labs Reviewed  CBC - Abnormal; Notable for the following components:      Result Value    RBC 3.32 (*)    Hemoglobin 9.6 (*)    HCT 30.8 (*)    RDW 15.7 (*)    Platelets 93 (*)    All other components within normal limits  RENAL FUNCTION PANEL - Abnormal; Notable for the following components:   Glucose, Bld 135 (*)    BUN 84 (*)    Creatinine, Ser 5.62 (*)    Calcium 7.8 (*)    Phosphorus 5.1 (*)    GFR, Estimated 11 (*)    All other components within normal limits  HEPATITIS B SURFACE ANTIGEN  HEPATITIS B SURFACE ANTIBODY, QUANTITATIVE  EKG None  Radiology No results found.  Procedures Procedures    Medications Ordered in ED Medications  Chlorhexidine Gluconate Cloth 2 % PADS 6 each (has no administration in time range)  HYDROmorphone (DILAUDID) injection 1 mg (1 mg Intramuscular Given 01/20/24 1029)  heparin injection 2,000 Units (2,000 Units Dialysis Given 01/20/24 1242)    ED Course/ Medical Decision Making/ A&P                                 Medical Decision Making Amount and/or Complexity of Data Reviewed Labs: ordered.  Risk Prescription drug management.   DARIS HARKINS is a 63 y.o. male with a past medical history significant for ESRD on dialysis TTS, hypertension, chronic neck pain, diabetes, PTSD, and COPD who presents for dialysis and his chronic neck pain.  According to patient, he is here for dialysis and denies other complaints aside from his neck pain bothering him again.  He denies any chest pain or shortness of breath peer denies any fevers, chills, nausea, vomiting, constipation, diarrhea, and denies injuries.  On exam, lungs clear.  Chest nontender.  Abdomen nontender.  Patient otherwise well-appearing. The tender as it typically is.  Will get screening labs and will get dose of IM pain medicine.  Labs were ordered and dialysis orders placed by nephrology.  Anticipate discharge after dialysis if he continues to feel well.         Final Clinical Impression(s) / ED Diagnoses Final diagnoses:  Dialysis patient [facility])    Clinical Impression: 1. Dialysis patient [facility])     Disposition: To dialysis, anticipate discharge after.  This note was prepared with assistance of Conservation officer, historic buildings. Occasional wrong-word or sound-a-like substitutions may have occurred due to the inherent limitations of voice recognition software.     Kassadee Carawan, Canary Brim, MD 01/20/24 [phone]

## 2024-01-20 NOTE — ED Triage Notes (Signed)
 Presents for routine dialysis, usual schedule is t-th-sat, no recently missed days.  Endorses chronic neck pain, would like this addressed at visit today.

## 2024-01-20 NOTE — Progress Notes (Signed)
   01/20/24 1608  Vitals  Temp (!) 97.2 F (36.2 C)  Pulse Rate 86  Resp 10  BP (!) 115/95  SpO2 100 %  O2 Device Nasal Cannula  Weight  (Unable to obtain accurate standing wt due to pt cramping.)  Type of Weight Post-Dialysis  Oxygen Therapy  O2 Flow Rate (L/min) 3 L/min  Patient Activity (if Appropriate) In bed  Pulse Oximetry Type Continuous  Oximetry Probe Site Changed No  Post Treatment  Dialyzer Clearance Clear  Hemodialysis Intake (mL) 0 mL  Liters Processed 78  Fluid Removed (mL) 3 mL  Tolerated HD Treatment Yes  AVG/AVF Arterial Site Held (minutes) 5 minutes  AVG/AVF Venous Site Held (minutes) 6 minutes  During Treatment Monitoring  Duration of HD Treatment -hour(s) 3.25 hour(s)   Received patient in bed to unit.  Alert and oriented.  Informed consent signed and in chart.   TX duration:3.25hrs  Patient tolerated well.  Transported back to the room  Alert, without acute distress.  Hand-off given to patient's nurse.   Access used: LUA AVF Access issues: none  Total UF removed: 3L Medication(s) given: none    [facility]

## 2024-01-23 ENCOUNTER — Ambulatory Visit (HOSPITAL_COMMUNITY)
Admission: EM | Admit: 2024-01-23 | Discharge: 2024-01-23 | Disposition: A | Discharge status: Home / Self Care | Payer: 59 | Attending: Nurse Practitioner | Admitting: Nurse Practitioner

## 2024-01-23 DIAGNOSIS — M542 Cervicalgia: Secondary | ICD-10-CM | POA: Insufficient documentation

## 2024-01-23 LAB — HEPATITIS B SURFACE ANTIBODY, QUANTITATIVE: Hep B S AB Quant (Post): 32.7 m[IU]/mL

## 2024-01-23 MED ORDER — HEPARIN SODIUM (PORCINE) 1000 UNIT/ML DIALYSIS
20.0000 [IU]/kg | INTRAMUSCULAR | Status: DC | PRN
  Administered 2024-01-23: 3100 [IU] via INTRAVENOUS_CENTRAL
  Filled 2024-01-23 (×2): qty 4

## 2024-01-23 MED ORDER — SODIUM CHLORIDE 0.9 % IV SOLN
250.0000 mg | Freq: Once | INTRAVENOUS | Status: AC
  Administered 2024-01-23: 250 mg via INTRAVENOUS
  Filled 2024-01-23: qty 12.5

## 2024-01-23 MED ORDER — OXYCODONE-ACETAMINOPHEN 5-325 MG PO TABS
1.0000 | ORAL_TABLET | ORAL | Status: DC | PRN
  Administered 2024-01-23: 1 via ORAL
  Filled 2024-01-23: qty 1

## 2024-01-23 MED ORDER — CHLORHEXIDINE GLUCONATE CLOTH 2 % EX PADS: 6.0000 | MEDICATED_PAD | Freq: Every day | CUTANEOUS | Status: DC

## 2024-01-23 NOTE — Progress Notes (Signed)
 Received patient in bed to unit.  Alert and oriented.  Informed consent signed and in chart.   TX duration:3:15  Patient tolerated well.  Transported back to the room  Alert, without acute distress.  Hand-off given to patient's nurse.   Access used: AVG Access issues: n/a  Total UF removed: 3L Medication(s) given: Iron Post HD weight: unable to obtain    01/23/24 1427  Vitals  Temp 97.6 F (36.4 C)  Temp Source Oral  BP (!) 173/74  MAP (mmHg) 97  BP Location Right Wrist  Patient Position (if appropriate) Sitting  Pulse Rate 72  Pulse Rate Source Monitor  ECG Heart Rate 73  Resp 11  Oxygen Therapy  SpO2 100 %  O2 Device Nasal Cannula  O2 Flow Rate (L/min) 3 L/min  During Treatment Monitoring  Blood Flow Rate (mL/min) 349 mL/min  Arterial Pressure (mmHg) -106.05 mmHg  Venous Pressure (mmHg) 215.95 mmHg  TMP (mmHg) 24.84 mmHg  Ultrafiltration Rate (mL/min) 1158 mL/min  Dialysate Flow Rate (mL/min) 299 ml/min  Dialysate Potassium Concentration 2  Dialysate Calcium Concentration 2.5  Duration of HD Treatment -hour(s) 3.2 hour(s)  Cumulative Fluid Removed (mL) per Treatment  2940  HD Safety Checks Performed Yes  Intra-Hemodialysis Comments Tx initiated;Tolerated well  Post Treatment  Dialyzer Clearance Lightly streaked  Hemodialysis Intake (mL) 120 mL  Liters Processed 77.7  Fluid Removed (mL) 3000 mL  Tolerated HD Treatment Yes  Fistula / Graft Left Upper arm Arteriovenous vein graft  Placement Date/Time: (c) 04/22/23 (c) 1000   Orientation: Left  Access Location: Upper arm  Access Type: Arteriovenous vein graft  Site Condition No complications  Fistula / Graft Assessment Present;Thrill;Bruit  Status Deaccessed     [facility]

## 2024-01-23 NOTE — ED Triage Notes (Addendum)
 Pt. Here for dialysis, and my neck is in pain. Completed full treatment on Saturday

## 2024-01-25 ENCOUNTER — Other Ambulatory Visit: Payer: Self-pay

## 2024-01-25 ENCOUNTER — Encounter (HOSPITAL_COMMUNITY): Payer: Self-pay

## 2024-01-25 ENCOUNTER — Emergency Department (HOSPITAL_COMMUNITY)
Admission: EM | Admit: 2024-01-25 | Discharge: 2024-01-25 | Disposition: A | Discharge status: Home / Self Care | Payer: 59 | Attending: Emergency Medicine | Admitting: Emergency Medicine

## 2024-01-25 DIAGNOSIS — Z5321 Procedure and treatment not carried out due to patient leaving prior to being seen by health care provider: Secondary | ICD-10-CM | POA: Insufficient documentation

## 2024-01-25 DIAGNOSIS — Z992 Dependence on renal dialysis: Secondary | ICD-10-CM | POA: Insufficient documentation

## 2024-01-25 DIAGNOSIS — G8929 Other chronic pain: Secondary | ICD-10-CM | POA: Insufficient documentation

## 2024-01-25 DIAGNOSIS — M542 Cervicalgia: Secondary | ICD-10-CM | POA: Insufficient documentation

## 2024-01-25 MED ORDER — OXYCODONE-ACETAMINOPHEN 5-325 MG PO TABS
1.0000 | ORAL_TABLET | ORAL | Status: DC | PRN
  Administered 2024-01-25: 1 via ORAL
  Filled 2024-01-25: qty 1

## 2024-01-25 NOTE — Progress Notes (Signed)
 Received patient in bed.Awake,alert and oriented x 4.Consent verified.  Access used : Left arm AVG that worked well.  Duration of treatment :  2 out 3.5 hours prescribed.  Net uf: 1.4 L  Hemo comment. He quit on his treatment for due to transportation issue going homeHe signed an AMA form.  Hand off to the patient nurse.: Back to ed for discharge,with stable medical condition via transporter.

## 2024-01-25 NOTE — ED Triage Notes (Signed)
 Patient here for his dialysis treatment.  Last treatment Saturday.  ALso complaining about his chronic neck pain.

## 2024-01-25 NOTE — ED Notes (Signed)
 Report to the dialysis. Nurse ? Auber. . Stated tell the pt not to leave they are setting up the machine .

## 2024-01-27 ENCOUNTER — Encounter (HOSPITAL_COMMUNITY): Payer: Self-pay

## 2024-01-27 ENCOUNTER — Other Ambulatory Visit: Payer: Self-pay

## 2024-01-27 ENCOUNTER — Emergency Department (HOSPITAL_COMMUNITY)
Admission: EM | Admit: 2024-01-27 | Discharge: 2024-01-27 | Discharge status: Against Medical Advice | Attending: Emergency Medicine | Admitting: Emergency Medicine

## 2024-01-27 DIAGNOSIS — Z992 Dependence on renal dialysis: Secondary | ICD-10-CM | POA: Insufficient documentation

## 2024-01-27 DIAGNOSIS — Z794 Long term (current) use of insulin: Secondary | ICD-10-CM | POA: Diagnosis not present

## 2024-01-27 DIAGNOSIS — S161XXA Strain of muscle, fascia and tendon at neck level, initial encounter: Secondary | ICD-10-CM

## 2024-01-27 DIAGNOSIS — Z7982 Long term (current) use of aspirin: Secondary | ICD-10-CM | POA: Insufficient documentation

## 2024-01-27 DIAGNOSIS — Z5321 Procedure and treatment not carried out due to patient leaving prior to being seen by health care provider: Secondary | ICD-10-CM | POA: Insufficient documentation

## 2024-01-27 DIAGNOSIS — N186 End stage renal disease: Secondary | ICD-10-CM | POA: Diagnosis present

## 2024-01-27 DIAGNOSIS — Z79899 Other long term (current) drug therapy: Secondary | ICD-10-CM | POA: Insufficient documentation

## 2024-01-27 DIAGNOSIS — X58XXXA Exposure to other specified factors, initial encounter: Secondary | ICD-10-CM | POA: Insufficient documentation

## 2024-01-27 LAB — CBC
HCT: 27.7 % — ABNORMAL LOW (ref 39.0–52.0)
Hemoglobin: 8.8 g/dL — ABNORMAL LOW (ref 13.0–17.0)
MCH: 29 pg (ref 26.0–34.0)
MCHC: 31.8 g/dL (ref 30.0–36.0)
MCV: 91.4 fL (ref 80.0–100.0)
Platelets: 148 10*3/uL — ABNORMAL LOW (ref 150–400)
RBC: 3.03 MIL/uL — ABNORMAL LOW (ref 4.22–5.81)
RDW: 15.8 % — ABNORMAL HIGH (ref 11.5–15.5)
WBC: 6.7 10*3/uL (ref 4.0–10.5)
nRBC: 0 % (ref 0.0–0.2)

## 2024-01-27 LAB — RENAL FUNCTION PANEL
Albumin: 3.5 g/dL (ref 3.5–5.0)
Anion gap: 13 (ref 5–15)
BUN: 65 mg/dL — ABNORMAL HIGH (ref 8–23)
CO2: 27 mmol/L (ref 22–32)
Calcium: 8.1 mg/dL — ABNORMAL LOW (ref 8.9–10.3)
Chloride: 99 mmol/L (ref 98–111)
Creatinine, Ser: 5.54 mg/dL — ABNORMAL HIGH (ref 0.61–1.24)
GFR, Estimated: 11 mL/min — ABNORMAL LOW (ref >60)
Glucose, Bld: 133 mg/dL — ABNORMAL HIGH (ref 70–99)
Phosphorus: 5.4 mg/dL — ABNORMAL HIGH (ref 2.5–4.6)
Potassium: 3.7 mmol/L (ref 3.5–5.1)
Sodium: 139 mmol/L (ref 135–145)

## 2024-01-27 MED ORDER — CHLORHEXIDINE GLUCONATE CLOTH 2 % EX PADS: 6.0000 | MEDICATED_PAD | Freq: Every day | CUTANEOUS | Status: DC

## 2024-01-27 MED ORDER — LIDOCAINE HCL (PF) 1 % IJ SOLN: 5.0000 mL | INTRAMUSCULAR | Status: DC | PRN

## 2024-01-27 MED ORDER — HEPARIN SODIUM (PORCINE) 1000 UNIT/ML DIALYSIS: 1000.0000 [IU] | INTRAMUSCULAR | Status: DC | PRN

## 2024-01-27 MED ORDER — HEPARIN SODIUM (PORCINE) 1000 UNIT/ML DIALYSIS
20.0000 [IU]/kg | INTRAMUSCULAR | Status: DC | PRN
  Administered 2024-01-27: 3100 [IU] via INTRAVENOUS_CENTRAL
  Filled 2024-01-27: qty 4

## 2024-01-27 MED ORDER — ACETAMINOPHEN 500 MG PO TABS
1000.0000 mg | ORAL_TABLET | Freq: Once | ORAL | Status: AC
  Administered 2024-01-27: 1000 mg via ORAL
  Filled 2024-01-27: qty 2

## 2024-01-27 MED ORDER — ANTICOAGULANT SODIUM CITRATE 4% (200MG/5ML) IV SOLN
5.0000 mL | Status: DC | PRN
  Filled 2024-01-27: qty 5

## 2024-01-27 MED ORDER — LIDOCAINE-PRILOCAINE 2.5-2.5 % EX CREA: 1.0000 | TOPICAL_CREAM | CUTANEOUS | Status: DC | PRN

## 2024-01-27 MED ORDER — ALTEPLASE 2 MG IJ SOLR: 2.0000 mg | Freq: Once | INTRAMUSCULAR | Status: DC | PRN

## 2024-01-27 MED ORDER — PENTAFLUOROPROP-TETRAFLUOROETH EX AERO: 1.0000 | INHALATION_SPRAY | CUTANEOUS | Status: DC | PRN

## 2024-01-27 MED ORDER — HEPARIN SODIUM (PORCINE) 1000 UNIT/ML IJ SOLN
INTRAMUSCULAR | Status: AC
Start: 2024-01-27 — End: 2024-01-27
  Filled 2024-01-27: qty 4

## 2024-01-27 NOTE — ED Notes (Signed)
 Pt declines EKG or labs at this time.

## 2024-01-27 NOTE — ED Notes (Signed)
 Pt transported to dialysis

## 2024-01-27 NOTE — Procedures (Signed)
 I was present at this dialysis session. I have reviewed the session itself and made appropriate changes.   Filed Weights   01/27/24 0729  Weight: (!) 152.3 kg    Recent Labs  Lab 01/27/24 1008  NA 139  K 3.7  CL 99  CO2 27  GLUCOSE 133*  BUN 65*  CREATININE 5.54*  CALCIUM 8.1*  PHOS 5.4*    Recent Labs  Lab 01/27/24 1008  WBC 6.7  HGB 8.8*  HCT 27.7*  MCV 91.4  PLT 148*    Scheduled Meds:  Chlorhexidine Gluconate Cloth  6 each Topical Q0600   Continuous Infusions:  anticoagulant sodium citrate     PRN Meds:.alteplase, anticoagulant sodium citrate, heparin, heparin, lidocaine (PF), lidocaine-prilocaine, pentafluoroprop-tetrafluoroeth   Anthony Sar, MD [facility] 01/27/2024, 1:34 PM
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
## 2024-01-27 NOTE — ED Notes (Signed)
 Pt refusing cardiac monitoring or bp monitoring at this time. "Nothings wrong, I just need dialysis."

## 2024-01-27 NOTE — Progress Notes (Signed)
 Received patient in bed to unit.  Alert and oriented.  Informed consent signed and in chart.   TX duration:3 hours and 15 minutes  Patient had cramps at the end of the session and UF off the last 30 minutes. Transported back to the room  Alert, without acute distress.  Hand-off given to patient's nurse.   Access used: Left Graft upper arm Access issues: none  Total UF removed: 1.8L Medication(s) given: Tylenol   01/27/24 1359  Vitals  Temp (!) 97.5 F (36.4 C)  BP (!) 147/75  Pulse Rate 77  Resp 11  Oxygen Therapy  SpO2 100 %  O2 Device Nasal Cannula  O2 Flow Rate (L/min) 3 L/min  Patient Activity (if Appropriate) In bed  Pulse Oximetry Type Continuous  During Treatment Monitoring  Duration of HD Treatment -hour(s) 3.25 hour(s)  HD Safety Checks Performed Yes  Intra-Hemodialysis Comments Tx completed  Dialysis Fluid Bolus Normal Saline  Bolus Amount (mL) 300 mL  Fistula / Graft Left Upper arm Arteriovenous vein graft  Placement Date/Time: (c) 04/22/23 (c) 1000   Orientation: Left  Access Location: Upper arm  Access Type: Arteriovenous vein graft  Status Deaccessed     Stacie Glaze LPN [facility]

## 2024-01-27 NOTE — ED Triage Notes (Signed)
 Pt is here for dialysis. States he is also having chronic neck pain which is causing a headache this am.

## 2024-01-27 NOTE — ED Notes (Addendum)
 Report given to Minerva Areola at Dialysis. Pt awaiting transport to Dialysis

## 2024-01-30 ENCOUNTER — Emergency Department (HOSPITAL_COMMUNITY)
Admission: EM | Admit: 2024-01-30 | Discharge: 2024-01-30 | Discharge status: Against Medical Advice | Attending: Emergency Medicine | Admitting: Emergency Medicine

## 2024-01-30 ENCOUNTER — Other Ambulatory Visit: Payer: Self-pay

## 2024-01-30 ENCOUNTER — Encounter (HOSPITAL_COMMUNITY): Payer: Self-pay

## 2024-01-30 ENCOUNTER — Emergency Department (HOSPITAL_COMMUNITY)

## 2024-01-30 DIAGNOSIS — Z7902 Long term (current) use of antithrombotics/antiplatelets: Secondary | ICD-10-CM | POA: Insufficient documentation

## 2024-01-30 DIAGNOSIS — Z794 Long term (current) use of insulin: Secondary | ICD-10-CM | POA: Diagnosis not present

## 2024-01-30 DIAGNOSIS — Z992 Dependence on renal dialysis: Secondary | ICD-10-CM | POA: Insufficient documentation

## 2024-01-30 DIAGNOSIS — Z79899 Other long term (current) drug therapy: Secondary | ICD-10-CM | POA: Diagnosis not present

## 2024-01-30 DIAGNOSIS — N186 End stage renal disease: Secondary | ICD-10-CM | POA: Diagnosis not present

## 2024-01-30 LAB — RENAL FUNCTION PANEL
Albumin: 3.3 g/dL — ABNORMAL LOW (ref 3.5–5.0)
Anion gap: 12 (ref 5–15)
BUN: 76 mg/dL — ABNORMAL HIGH (ref 8–23)
CO2: 25 mmol/L (ref 22–32)
Calcium: 7.5 mg/dL — ABNORMAL LOW (ref 8.9–10.3)
Chloride: 95 mmol/L — ABNORMAL LOW (ref 98–111)
Creatinine, Ser: 5.89 mg/dL — ABNORMAL HIGH (ref 0.61–1.24)
GFR, Estimated: 10 mL/min — ABNORMAL LOW (ref >60)
Glucose, Bld: 570 mg/dL (ref 70–99)
Phosphorus: 5.5 mg/dL — ABNORMAL HIGH (ref 2.5–4.6)
Potassium: 4.5 mmol/L (ref 3.5–5.1)
Sodium: 132 mmol/L — ABNORMAL LOW (ref 135–145)

## 2024-01-30 LAB — CBC
HCT: 27.2 % — ABNORMAL LOW (ref 39.0–52.0)
Hemoglobin: 8.7 g/dL — ABNORMAL LOW (ref 13.0–17.0)
MCH: 29 pg (ref 26.0–34.0)
MCHC: 32 g/dL (ref 30.0–36.0)
MCV: 90.7 fL (ref 80.0–100.0)
Platelets: 116 10*3/uL — ABNORMAL LOW (ref 150–400)
RBC: 3 MIL/uL — ABNORMAL LOW (ref 4.22–5.81)
RDW: 15.2 % (ref 11.5–15.5)
WBC: 6.5 10*3/uL (ref 4.0–10.5)
nRBC: 0 % (ref 0.0–0.2)

## 2024-01-30 MED ORDER — HEPARIN SODIUM (PORCINE) 1000 UNIT/ML IJ SOLN
INTRAMUSCULAR | Status: AC
  Filled 2024-01-30: qty 2

## 2024-01-30 MED ORDER — DARBEPOETIN ALFA 200 MCG/0.4ML IJ SOSY: 200.0000 ug | PREFILLED_SYRINGE | Freq: Once | INTRAMUSCULAR | Status: DC

## 2024-01-30 MED ORDER — OXYCODONE-ACETAMINOPHEN 5-325 MG PO TABS
1.0000 | ORAL_TABLET | Freq: Once | ORAL | Status: AC
  Administered 2024-01-30: 1 via ORAL
  Filled 2024-01-30: qty 1

## 2024-01-30 MED ORDER — CHLORHEXIDINE GLUCONATE CLOTH 2 % EX PADS: 6.0000 | MEDICATED_PAD | Freq: Every day | CUTANEOUS | Status: DC

## 2024-01-30 MED ORDER — HEPARIN SODIUM (PORCINE) 1000 UNIT/ML DIALYSIS
2000.0000 [IU] | Freq: Once | INTRAMUSCULAR | Status: AC
  Administered 2024-01-30: 2000 [IU] via INTRAVENOUS_CENTRAL

## 2024-01-30 NOTE — Progress Notes (Signed)
 Received patient in bed to unit.  Alert and oriented.  Informed consent signed and in chart.   TX duration:2hrs49min  Patient tolerated well.  Transported back to the room  Alert, without acute distress.  Hand-off given to patient's nurse.   Access used: LAVG Access issues: none  Total UF removed: 3.5L Medication(s) given: heparin bolus   01/30/24 1612  Vitals  BP 136/69  MAP (mmHg) 89  Pulse Rate 83  ECG Heart Rate 84  Resp 11  Oxygen Therapy  SpO2 99 %  During Treatment Monitoring  Blood Flow Rate (mL/min) 399 mL/min  Arterial Pressure (mmHg) -187.87 mmHg  Venous Pressure (mmHg) 264.43 mmHg  TMP (mmHg) 30.7 mmHg  Ultrafiltration Rate (mL/min) 1986 mL/min  Dialysate Flow Rate (mL/min) 300 ml/min  Duration of HD Treatment -hour(s) 2.13 hour(s)  Cumulative Fluid Removed (mL) per Treatment  3430.29  HD Safety Checks Performed Yes  Intra-Hemodialysis Comments Tx completed  Dialysis Fluid Bolus Normal Saline  Bolus Amount (mL) 300 mL  Post Treatment  Dialyzer Clearance Clear  Hemodialysis Intake (mL) 300 mL  Liters Processed 51.5  Fluid Removed (mL) 3500 mL  Tolerated HD Treatment Yes  AVG/AVF Arterial Site Held (minutes) 5 minutes  AVG/AVF Venous Site Held (minutes) 5 minutes  Note  Patient Observations tx compleated per pt request for transportation needs  Fistula / Graft Left Upper arm Arteriovenous vein graft  Placement Date/Time: (c) 04/22/23 (c) 1000   Orientation: Left  Access Location: Upper arm  Access Type: Arteriovenous vein graft  Site Condition No complications  Fistula / Graft Assessment Present  Status Deaccessed      Freddi Starr, RN [facility]

## 2024-01-30 NOTE — ED Triage Notes (Signed)
 Pt here for dialysis. Pt has pain all over and especially in neck which is chronic for him.

## 2024-01-30 NOTE — Procedures (Signed)
 I was present at the procedure, reviewed the HD regimen and made appropriate changes.   Vinson Moselle MD  [facility] 01/30/2024, 3:35 PM

## 2024-01-30 NOTE — ED Provider Notes (Signed)
 [place] EMERGENCY DEPARTMENT AT [facility] Provider Note   CSN: [id number] Arrival date & time: 01/30/24  2440     History  Chief Complaint  Patient presents with   needs dialysis    Jeffrey [name] is a 63 y.o. male.  63 year old male presents today for routine dialysis.  He denies any complaints at this time.  Reports generalized pain which is normal for him.  The history is provided by the patient. No language interpreter was used.       Home Medications Prior to Admission medications   Medication Sig Start Date End Date Taking? Authorizing Provider  acetaminophen (TYLENOL) 325 MG tablet Take 2 tablets (650 mg total) by mouth every 6 (six) hours. 07/23/23   Lockie Mola, MD  amLODipine (NORVASC) 10 MG tablet Take 10 mg by mouth every morning. 04/17/20   [provider]  ASPIRIN LOW DOSE 81 MG EC tablet Take 81 mg by mouth in the morning. 02/22/21   [provider]  atorvastatin (LIPITOR) 40 MG tablet Take 40 mg by mouth in the morning. 02/09/21   [provider]  calcium acetate (PHOSLO) 667 MG capsule Take 2 capsules (1,334 mg total) by mouth with breakfast, with lunch, and with evening meal. 12/21/23   Ejigiri, Megan Mans, PA-C  carvedilol (COREG) 12.5 MG tablet Take 12.5 mg by mouth 2 (two) times daily with a meal. 04/17/20   [provider]  clopidogrel (PLAVIX) 75 MG tablet Take 1 tablet (75 mg total) by mouth daily. 11/02/22   Rhyne, Ames Coupe, PA-C  diazepam (VALIUM) 5 MG tablet Take 5 mg by mouth 2 (two) times daily as needed for muscle spasms. 12/09/21   [provider]  Fluticasone-Umeclidin-Vilant (TRELEGY ELLIPTA IN) Inhale 1 puff into the lungs daily.    [provider]  insulin lispro (HUMALOG KWIKPEN) 100 UNIT/ML KwikPen Inject 0-9 Units into the skin 3 (three) times daily with meals. CBG < 70: treat low blood sugar CBG 70 - 120: 0 units  CBG 121 - 150: 1 unit  CBG 151 - 200: 2 units  CBG 201 -  250: 3 units  CBG 251 - 300: 5 units  CBG 301 - 350: 7 units  CBG 351 - 400: 9 units  CBG > 400: call MD 09/27/23   Zigmund Daniel., MD  insulin regular human CONCENTRATED (HUMULIN R U-500 KWIKPEN) 500 UNIT/ML KwikPen Inject 55 Units into the skin 2 (two) times daily with a meal. Follow up with your PCP and [facility] for additional adjustments 09/27/23 10/27/23  Zigmund Daniel., MD  iron polysaccharides (NIFEREX) 150 MG capsule Take 1 capsule (150 mg total) by mouth daily. 09/26/23   Meredeth Ide, MD  nortriptyline (PAMELOR) 10 MG capsule Take 20 mg by mouth at bedtime.    [provider]  pantoprazole (PROTONIX) 40 MG tablet Take 40 mg by mouth daily before breakfast. 02/09/21   [provider]  pregabalin (LYRICA) 75 MG capsule Take 1 capsule (75 mg total) by mouth See admin instructions. Daily.  Give after dialysis on dialysis days. Patient taking differently: Take 75 mg by mouth daily. 01/21/23   [name], MD  sertraline (ZOLOFT) 100 MG tablet Take 100 mg by mouth in the morning. 02/22/21   [provider]  tamsulosin (FLOMAX) 0.4 MG CAPS capsule Take 1 capsule (0.4 mg total) by mouth daily. 11/10/21   [name], MD  torsemide (DEMADEX) 20 MG tablet Take 120  mg by mouth 2 (two) times daily.    [provider]  traZODone (DESYREL) 150 MG tablet Take 150 mg by mouth at bedtime. 12/09/21   [provider]  Vitamin D, Ergocalciferol, (DRISDOL) 1.25 MG (50000 UNIT) CAPS capsule Take 1 capsule (50,000 Units total) by mouth every 7 (seven) days. 10/02/23   Meredeth Ide, MD      Allergies    Actos [pioglitazone], Dexmedetomidine, Ibuprofen, Tomato, and Wellbutrin [bupropion]    Review of Systems   Review of Systems  Constitutional:  Negative for fever.  Respiratory:  Negative for shortness of breath.   Cardiovascular:  Negative for chest pain.  Gastrointestinal:  Negative for abdominal pain.  Neurological:  Negative for  light-headedness.  All other systems reviewed and are negative.   Physical Exam Updated Vital Signs BP (!) 145/68   Pulse 88   Temp 98.5 F (36.9 C) (Axillary)   Resp 17   Ht 5\' 5"  (1.651 m)   Wt (!) 152.3 kg   SpO2 98%   BMI 55.87 kg/m  Physical Exam Vitals and nursing note reviewed.  Constitutional:      General: He is not in acute distress.    Appearance: Normal appearance. He is not ill-appearing.  HENT:     Head: Normocephalic and atraumatic.     Nose: Nose normal.  Eyes:     Conjunctiva/sclera: Conjunctivae normal.  Pulmonary:     Effort: Pulmonary effort is normal. No respiratory distress.  Musculoskeletal:     Cervical back: Normal range of motion.  Skin:    Findings: No rash.  Neurological:     Mental Status: He is alert.     ED Results / Procedures / Treatments   Labs (all labs ordered are listed, but only abnormal results are displayed) Labs Reviewed  RENAL FUNCTION PANEL  CBC    EKG None  Radiology No results found.  Procedures Procedures    Medications Ordered in ED Medications  Chlorhexidine Gluconate Cloth 2 % PADS 6 each (has no administration in time range)  oxyCODONE-acetaminophen (PERCOCET/ROXICET) 5-325 MG per tablet 1 tablet (1 tablet Oral Given 01/30/24 1245)    ED Course/ Medical Decision Making/ A&P Clinical Course as of 01/30/24 1501  Tue Jan 30, 2024  1308 Notified that patient was having sudden onset of dyspnea.  Patient received a dose of Percocet, went to the bathroom and came back and started complaining of shortness of breath.  He states he feels better after his oxygen was increased to 4L.  Will obtain chest x-ray, EKG. [AA]    Clinical Course User Index [AA] Marita Kansas, PA-C                                 Medical Decision Making Amount and/or Complexity of Data Reviewed Labs: ordered. Radiology: ordered.  Risk Prescription drug management.   63 year old male presents today for concern of routine dialysis.   Dose of Percocet given for generalized pain.  He appears stable without acute distress.  Notified nephrologist who will set him up for dialysis.  Patient around 1 PM had sudden onset of shortness of breath.  He has not recently missed any dialysis session.  He just received dose of Percocet and went to the bathroom and came back symptomatic.  Does feel improved with increased O2.  Will order chest x-ray, EKG.  This is currently pending.  Prior to my reevaluation patient was  moved to dialysis.  His blood work has not resulted at this time.  He will need to be reevaluated after dialysis.   Final Clinical Impression(s) / ED Diagnoses Final diagnoses:  ESRD (end stage renal disease) [facility])    Rx / DC Orders ED Discharge Orders     None         Marita Kansas, PA-C 01/30/24 1504    Glyn Ade, MD 01/30/24 1525

## 2024-02-01 ENCOUNTER — Other Ambulatory Visit: Payer: Self-pay

## 2024-02-01 ENCOUNTER — Emergency Department (HOSPITAL_COMMUNITY)
Admission: EM | Admit: 2024-02-01 | Discharge: 2024-02-01 | Discharge status: Against Medical Advice | Attending: Emergency Medicine | Admitting: Emergency Medicine

## 2024-02-01 DIAGNOSIS — M542 Cervicalgia: Secondary | ICD-10-CM | POA: Diagnosis not present

## 2024-02-01 DIAGNOSIS — Z992 Dependence on renal dialysis: Secondary | ICD-10-CM | POA: Diagnosis not present

## 2024-02-01 DIAGNOSIS — Z7982 Long term (current) use of aspirin: Secondary | ICD-10-CM | POA: Diagnosis not present

## 2024-02-01 DIAGNOSIS — Z7902 Long term (current) use of antithrombotics/antiplatelets: Secondary | ICD-10-CM | POA: Insufficient documentation

## 2024-02-01 DIAGNOSIS — Z5329 Procedure and treatment not carried out because of patient's decision for other reasons: Secondary | ICD-10-CM | POA: Insufficient documentation

## 2024-02-01 DIAGNOSIS — N186 End stage renal disease: Secondary | ICD-10-CM | POA: Insufficient documentation

## 2024-02-01 MED ORDER — LIDOCAINE-PRILOCAINE 2.5-2.5 % EX CREA: 1.0000 | TOPICAL_CREAM | CUTANEOUS | Status: DC | PRN

## 2024-02-01 MED ORDER — LIDOCAINE HCL (PF) 1 % IJ SOLN: 5.0000 mL | INTRAMUSCULAR | Status: DC | PRN

## 2024-02-01 MED ORDER — NEPRO/CARBSTEADY PO LIQD: 237.0000 mL | ORAL | Status: DC | PRN

## 2024-02-01 MED ORDER — HEPARIN SODIUM (PORCINE) 1000 UNIT/ML DIALYSIS
2000.0000 [IU] | Freq: Once | INTRAMUSCULAR | Status: AC
  Filled 2024-02-01: qty 2

## 2024-02-01 MED ORDER — CHLORHEXIDINE GLUCONATE CLOTH 2 % EX PADS: 6.0000 | MEDICATED_PAD | Freq: Every day | CUTANEOUS | Status: DC

## 2024-02-01 MED ORDER — ANTICOAGULANT SODIUM CITRATE 4% (200MG/5ML) IV SOLN: 5.0000 mL | Status: DC | PRN

## 2024-02-01 MED ORDER — PENTAFLUOROPROP-TETRAFLUOROETH EX AERO: 1.0000 | INHALATION_SPRAY | CUTANEOUS | Status: DC | PRN

## 2024-02-01 MED ORDER — ALTEPLASE 2 MG IJ SOLR: 2.0000 mg | Freq: Once | INTRAMUSCULAR | Status: DC | PRN

## 2024-02-01 MED ORDER — HYDROCODONE-ACETAMINOPHEN 5-325 MG PO TABS
1.0000 | ORAL_TABLET | Freq: Once | ORAL | Status: AC
  Administered 2024-02-01: 1 via ORAL
  Filled 2024-02-01: qty 1

## 2024-02-01 MED ORDER — HEPARIN SODIUM (PORCINE) 1000 UNIT/ML DIALYSIS: 1000.0000 [IU] | INTRAMUSCULAR | Status: DC | PRN

## 2024-02-01 NOTE — ED Notes (Signed)
 Pt returned to ED. No pain. Transferred himself to wheelchair and seen leaving ED. Pt did not want to stay for DC instructions. A&Ox4

## 2024-02-01 NOTE — Procedures (Addendum)
 Asked to see patient for hospital dialysis. Patient is not being admitted. The plan will be for "ED HD". Pt will go to the dialysis unit upstairs when HD is ready, and when dialysis is completed pt will be sent back to ED for reassessment.      Last OP HD orders (dec 2023):  3:15h  129kg  RUE AVG   Heparin 2000       Clinical issues: 1) Anemia of esrd:  ** Pt dose not have an outpatient nephrologist or HD unit so does not have access to ESA and other needed medications.  ** pt was getting darbe in spring 2024 May but Hb continued to drop, so dosing of darbepoeitin was increased up to 100 then 150 mcg q 1-2 wks.  Also pt required two 1 gm IV Fe loading programs during the summer and early fall to get the Hb to respond up to Hb 8-10. In Sept / Oct 2024, Hb dropped down back into the 8's and the darbe was ^'d to 200 mcg (every 1-2 wks). In Feb 2025 Hb is in the low 9s, in March Hb 8.8. Will need darbe next HD here.    Plan: Last dose of darbepoetin 200 mcg was 01/18/24. Ready for next esa on next HD day.  Darbepoeitin needs to ordered as "once"  or "once in dialysis". Otherwise the order will default to "SQ at 1800" but that timing is not good because he typically needs to be ready to leave at 5 pm to catch his ride home.  Vinson Moselle  MD  [facility] 02/01/2024, 1:41 PM  Recent Labs  Lab 01/27/24 1008 01/30/24 1335  HGB 8.8* 8.7*  ALBUMIN 3.5 3.3*  CALCIUM 8.1* 7.5*  PHOS 5.4* 5.5*  CREATININE 5.54* 5.89*  K 3.7 4.5    Inpatient medications:  Chlorhexidine Gluconate Cloth  6 each Topical Q0600

## 2024-02-01 NOTE — Progress Notes (Signed)
 Received patient in bed to unit.  Alert and oriented.  Informed consent signed and in chart.   TX duration: 3.5  Patient tolerated well.  Transported back to the room  Alert, without acute distress.  Hand-off given to patient's nurse.   Access used: AVF Access issues: None  Total UF removed: 3 Medication(s) given: None   02/01/24 1233  Vitals  BP (!) 158/66  Pulse Rate 80  Resp 12  MEWS COLOR  MEWS Score Color Green  Oxygen Therapy  SpO2 100 %  O2 Device Nasal Cannula  Patient Activity (if Appropriate) In bed

## 2024-02-01 NOTE — ED Triage Notes (Signed)
 Pt. Here for dialysis, Stayed on machine 2 hours on Tuesday. Continues to have neck pain

## 2024-02-01 NOTE — ED Provider Notes (Signed)
 [place] EMERGENCY DEPARTMENT AT [facility] Provider Note   CSN: [id number] Arrival date & time: 02/01/24  8295     History  Chief Complaint  Patient presents with   Vascular Access Problem   Neck Pain    Jeffrey [name] is a 63 y.o. male.   63 year old male with history of ESRD presents to the emergency department for routine dialysis.  He was last dialyzed on Tuesday and had full treatment without complication.  His only complaint today is neck pain, he reports he has had ongoing chronic neck issues and has been following up as an outpatient for this with plans for potential surgery.  Denies any chest pain or shortness of breath is on his baseline oxygen.    Home Medications Prior to Admission medications   Medication Sig Start Date End Date Taking? Authorizing Provider  acetaminophen (TYLENOL) 325 MG tablet Take 2 tablets (650 mg total) by mouth every 6 (six) hours. 07/23/23   Lockie Mola, MD  amLODipine (NORVASC) 10 MG tablet Take 10 mg by mouth every morning. 04/17/20   [provider]  ASPIRIN LOW DOSE 81 MG EC tablet Take 81 mg by mouth in the morning. 02/22/21   [provider]  atorvastatin (LIPITOR) 40 MG tablet Take 40 mg by mouth in the morning. 02/09/21   [provider]  calcium acetate (PHOSLO) 667 MG capsule Take 2 capsules (1,334 mg total) by mouth with breakfast, with lunch, and with evening meal. 12/21/23   Ejigiri, Megan Mans, PA-C  carvedilol (COREG) 12.5 MG tablet Take 12.5 mg by mouth 2 (two) times daily with a meal. 04/17/20   [provider]  clopidogrel (PLAVIX) 75 MG tablet Take 1 tablet (75 mg total) by mouth daily. 11/02/22   Rhyne, Ames Coupe, PA-C  diazepam (VALIUM) 5 MG tablet Take 5 mg by mouth 2 (two) times daily as needed for muscle spasms. 12/09/21   [provider]  Fluticasone-Umeclidin-Vilant (TRELEGY ELLIPTA IN) Inhale 1 puff into the lungs daily.    [provider]  insulin lispro  (HUMALOG KWIKPEN) 100 UNIT/ML KwikPen Inject 0-9 Units into the skin 3 (three) times daily with meals. CBG < 70: treat low blood sugar CBG 70 - 120: 0 units  CBG 121 - 150: 1 unit  CBG 151 - 200: 2 units  CBG 201 - 250: 3 units  CBG 251 - 300: 5 units  CBG 301 - 350: 7 units  CBG 351 - 400: 9 units  CBG > 400: call MD 09/27/23   Zigmund Daniel., MD  insulin regular human CONCENTRATED (HUMULIN R U-500 KWIKPEN) 500 UNIT/ML KwikPen Inject 55 Units into the skin 2 (two) times daily with a meal. Follow up with your PCP and [facility] for additional adjustments 09/27/23 10/27/23  Zigmund Daniel., MD  iron polysaccharides (NIFEREX) 150 MG capsule Take 1 capsule (150 mg total) by mouth daily. 09/26/23   Meredeth Ide, MD  nortriptyline (PAMELOR) 10 MG capsule Take 20 mg by mouth at bedtime.    [provider]  pantoprazole (PROTONIX) 40 MG tablet Take 40 mg by mouth daily before breakfast. 02/09/21   [provider]  pregabalin (LYRICA) 75 MG capsule Take 1 capsule (75 mg total) by mouth See admin instructions. Daily.  Give after dialysis on dialysis days. Patient taking differently: Take 75 mg by mouth daily. 01/21/23   [name], MD  sertraline (ZOLOFT) 100 MG tablet Take 100 mg by mouth  in the morning. 02/22/21   [provider]  tamsulosin (FLOMAX) 0.4 MG CAPS capsule Take 1 capsule (0.4 mg total) by mouth daily. 11/10/21   [name], MD  torsemide (DEMADEX) 20 MG tablet Take 120 mg by mouth 2 (two) times daily.    [provider]  traZODone (DESYREL) 150 MG tablet Take 150 mg by mouth at bedtime. 12/09/21   [provider]  Vitamin D, Ergocalciferol, (DRISDOL) 1.25 MG (50000 UNIT) CAPS capsule Take 1 capsule (50,000 Units total) by mouth every 7 (seven) days. 10/02/23   Meredeth Ide, MD      Allergies    Actos [pioglitazone], Dexmedetomidine, Ibuprofen, Tomato, and Wellbutrin [bupropion]    Review of Systems   Review of Systems   Musculoskeletal:  Positive for neck pain.  All other systems reviewed and are negative.   Physical Exam Updated Vital Signs BP (!) 154/61   Pulse 86   Temp 98.1 F (36.7 C) (Temporal)   Resp (!) 22   SpO2 99%  Physical Exam Vitals and nursing note reviewed.  Constitutional:      General: He is not in acute distress.    Appearance: Normal appearance. He is well-developed. He is not diaphoretic.  HENT:     Head: Normocephalic and atraumatic.  Eyes:     General:        Right eye: No discharge.        Left eye: No discharge.     Pupils: Pupils are equal, round, and reactive to light.  Cardiovascular:     Rate and Rhythm: Normal rate and regular rhythm.     Pulses: Normal pulses.     Heart sounds: Normal heart sounds.  Pulmonary:     Effort: Pulmonary effort is normal. No respiratory distress.     Breath sounds: Normal breath sounds. No wheezing or rales.     Comments: Respirations equal and unlabored, patient able to speak in full sentences, lungs clear to auscultation bilaterally on chronic 2L [place] Abdominal:     General: There is no distension.     Palpations: Abdomen is soft.     Comments: Abdomen soft, nondistended, nontender to palpation in all quadrants without guarding or peritoneal signs  Musculoskeletal:        General: No deformity.     Cervical back: Neck supple.  Skin:    General: Skin is warm and dry.     Capillary Refill: Capillary refill takes less than 2 seconds.  Neurological:     Mental Status: He is alert and oriented to person, place, and time.     Coordination: Coordination normal.     Comments: Speech is clear, able to follow commands Moves extremities without ataxia, coordination intact  Psychiatric:        Mood and Affect: Mood normal.        Behavior: Behavior normal.     ED Results / Procedures / Treatments   Labs (all labs ordered are listed, but only abnormal results are displayed) Labs Reviewed - No data to  display  EKG None  Radiology DG Chest Portable 1 View Result Date: 01/30/2024 CLINICAL DATA:  Shortness of breath EXAM: PORTABLE CHEST 1 VIEW COMPARISON:  11/23/2023 FINDINGS: Limited by habitus. Right shoulder replacement. Cardiomegaly with vascular congestion and mild diffuse interstitial opacities suspicious for edema. No sizeable effusions. IMPRESSION: Cardiomegaly with vascular congestion and suspected mild edema Electronically Signed   By: Jasmine Pang M.D.   On: 01/30/2024 15:55  Procedures Procedures    Medications Ordered in ED Medications  Chlorhexidine Gluconate Cloth 2 % PADS 6 each (has no administration in time range)  pentafluoroprop-tetrafluoroeth (GEBAUERS) aerosol 1 Application (has no administration in time range)  lidocaine (PF) (XYLOCAINE) 1 % injection 5 mL (has no administration in time range)  lidocaine-prilocaine (EMLA) cream 1 Application (has no administration in time range)  feeding supplement (NEPRO CARB STEADY) liquid 237 mL (has no administration in time range)  heparin injection 1,000 Units (has no administration in time range)  anticoagulant sodium citrate solution 5 mL (has no administration in time range)  alteplase (CATHFLO ACTIVASE) injection 2 mg (has no administration in time range)  heparin injection 2,000 Units (has no administration in time range)  HYDROcodone-acetaminophen (NORCO/VICODIN) 5-325 MG per tablet 1 tablet (1 tablet Oral Given 02/01/24 0454)    ED Course/ Medical Decision Making/ A&P                                 Medical Decision Making Risk Prescription drug management.   Patient here for routine dialysis, vitals are stable and patient with no concerning symptoms since last dialysis on Tuesday.  Is dealing with some chronic neck pain, 1 dose of p.o. pain medication given.  Notified Dr. Chales Abrahams with nephrology that patient has arrived and he is placing dialysis orders.  Patient will remain in ED until dialysis chair is ready.   Anticipate discharge from the ED after dialysis.        Final Clinical Impression(s) / ED Diagnoses Final diagnoses:  ESRD (end stage renal disease) [facility])    Rx / DC Orders ED Discharge Orders     None         Velda Shell 02/01/24 0981    Terald Sleeper, MD 02/01/24 1306

## 2024-02-03 ENCOUNTER — Emergency Department (HOSPITAL_COMMUNITY)
Admission: EM | Admit: 2024-02-03 | Discharge: 2024-02-03 | Discharge status: Against Medical Advice | Attending: Emergency Medicine | Admitting: Emergency Medicine

## 2024-02-03 ENCOUNTER — Encounter (HOSPITAL_COMMUNITY): Payer: Self-pay

## 2024-02-03 ENCOUNTER — Other Ambulatory Visit: Payer: Self-pay

## 2024-02-03 DIAGNOSIS — Z992 Dependence on renal dialysis: Secondary | ICD-10-CM | POA: Diagnosis not present

## 2024-02-03 DIAGNOSIS — N186 End stage renal disease: Secondary | ICD-10-CM | POA: Diagnosis not present

## 2024-02-03 DIAGNOSIS — Z794 Long term (current) use of insulin: Secondary | ICD-10-CM | POA: Insufficient documentation

## 2024-02-03 DIAGNOSIS — I509 Heart failure, unspecified: Secondary | ICD-10-CM | POA: Insufficient documentation

## 2024-02-03 DIAGNOSIS — I2510 Atherosclerotic heart disease of native coronary artery without angina pectoris: Secondary | ICD-10-CM | POA: Diagnosis not present

## 2024-02-03 DIAGNOSIS — E1122 Type 2 diabetes mellitus with diabetic chronic kidney disease: Secondary | ICD-10-CM | POA: Diagnosis not present

## 2024-02-03 DIAGNOSIS — M542 Cervicalgia: Secondary | ICD-10-CM | POA: Diagnosis present

## 2024-02-03 DIAGNOSIS — J449 Chronic obstructive pulmonary disease, unspecified: Secondary | ICD-10-CM | POA: Insufficient documentation

## 2024-02-03 DIAGNOSIS — Z7901 Long term (current) use of anticoagulants: Secondary | ICD-10-CM | POA: Insufficient documentation

## 2024-02-03 MED ORDER — DARBEPOETIN ALFA 200 MCG/0.4ML IJ SOSY: 200.0000 ug | PREFILLED_SYRINGE | INTRAMUSCULAR | Status: DC

## 2024-02-03 MED ORDER — DARBEPOETIN ALFA 200 MCG/0.4ML IJ SOSY
200.0000 ug | PREFILLED_SYRINGE | Freq: Once | INTRAMUSCULAR | Status: AC
  Administered 2024-02-03: 200 ug via SUBCUTANEOUS
  Filled 2024-02-03: qty 0.4

## 2024-02-03 MED ORDER — CHLORHEXIDINE GLUCONATE CLOTH 2 % EX PADS: 6.0000 | MEDICATED_PAD | Freq: Every day | CUTANEOUS | Status: DC

## 2024-02-03 MED ORDER — OXYCODONE HCL 5 MG PO TABS
5.0000 mg | ORAL_TABLET | Freq: Once | ORAL | Status: AC
  Administered 2024-02-03: 5 mg via ORAL
  Filled 2024-02-03: qty 1

## 2024-02-03 MED ORDER — DIAZEPAM 5 MG PO TABS
5.0000 mg | ORAL_TABLET | Freq: Once | ORAL | Status: AC
  Administered 2024-02-03: 5 mg via ORAL
  Filled 2024-02-03: qty 1

## 2024-02-03 NOTE — ED Provider Notes (Signed)
 [place] EMERGENCY DEPARTMENT AT [facility] Provider Note   CSN: [id number] Arrival date & time: 02/03/24  4332     History  Chief Complaint  Patient presents with   Vascular Access Problem    Jeffrey [name] is a 63 y.o. male.  Pt is a 63 yo male with pmhx significant for ESRD on HD (Tu, Th and Sat), DM2, COPD, CHF, PTSD, CAD, sleep apnea, and ambulatory dysfunction.  He does not have an outpatient dialysis center, so has been coming here.  Pt's only complaint today is neck pain with pain radiating down his left arm.  This has been a chronic issue.  He gets his dialysis via his LUE AVF.         Home Medications Prior to Admission medications   Medication Sig Start Date End Date Taking? Authorizing Provider  acetaminophen (TYLENOL) 325 MG tablet Take 2 tablets (650 mg total) by mouth every 6 (six) hours. 07/23/23   Lockie Mola, MD  amLODipine (NORVASC) 10 MG tablet Take 10 mg by mouth every morning. 04/17/20   [provider]  ASPIRIN LOW DOSE 81 MG EC tablet Take 81 mg by mouth in the morning. 02/22/21   [provider]  atorvastatin (LIPITOR) 40 MG tablet Take 40 mg by mouth in the morning. 02/09/21   [provider]  calcium acetate (PHOSLO) 667 MG capsule Take 2 capsules (1,334 mg total) by mouth with breakfast, with lunch, and with evening meal. 12/21/23   Ejigiri, Megan Mans, PA-C  carvedilol (COREG) 12.5 MG tablet Take 12.5 mg by mouth 2 (two) times daily with a meal. 04/17/20   [provider]  clopidogrel (PLAVIX) 75 MG tablet Take 1 tablet (75 mg total) by mouth daily. 11/02/22   Rhyne, Ames Coupe, PA-C  diazepam (VALIUM) 5 MG tablet Take 5 mg by mouth 2 (two) times daily as needed for muscle spasms. 12/09/21   [provider]  Fluticasone-Umeclidin-Vilant (TRELEGY ELLIPTA IN) Inhale 1 puff into the lungs daily.    [provider]  insulin lispro (HUMALOG KWIKPEN) 100 UNIT/ML KwikPen Inject 0-9 Units into the  skin 3 (three) times daily with meals. CBG < 70: treat low blood sugar CBG 70 - 120: 0 units  CBG 121 - 150: 1 unit  CBG 151 - 200: 2 units  CBG 201 - 250: 3 units  CBG 251 - 300: 5 units  CBG 301 - 350: 7 units  CBG 351 - 400: 9 units  CBG > 400: call MD 09/27/23   Zigmund Daniel., MD  insulin regular human CONCENTRATED (HUMULIN R U-500 KWIKPEN) 500 UNIT/ML KwikPen Inject 55 Units into the skin 2 (two) times daily with a meal. Follow up with your PCP and [facility] for additional adjustments 09/27/23 10/27/23  Zigmund Daniel., MD  iron polysaccharides (NIFEREX) 150 MG capsule Take 1 capsule (150 mg total) by mouth daily. 09/26/23   Meredeth Ide, MD  nortriptyline (PAMELOR) 10 MG capsule Take 20 mg by mouth at bedtime.    [provider]  pantoprazole (PROTONIX) 40 MG tablet Take 40 mg by mouth daily before breakfast. 02/09/21   [provider]  pregabalin (LYRICA) 75 MG capsule Take 1 capsule (75 mg total) by mouth See admin instructions. Daily.  Give after dialysis on dialysis days. Patient taking differently: Take 75 mg by mouth daily. 01/21/23   [name], MD  sertraline (ZOLOFT) 100 MG tablet Take 100 mg by mouth in  the morning. 02/22/21   [provider]  tamsulosin (FLOMAX) 0.4 MG CAPS capsule Take 1 capsule (0.4 mg total) by mouth daily. 11/10/21   [name], MD  torsemide (DEMADEX) 20 MG tablet Take 120 mg by mouth 2 (two) times daily.    [provider]  traZODone (DESYREL) 150 MG tablet Take 150 mg by mouth at bedtime. 12/09/21   [provider]  Vitamin D, Ergocalciferol, (DRISDOL) 1.25 MG (50000 UNIT) CAPS capsule Take 1 capsule (50,000 Units total) by mouth every 7 (seven) days. 10/02/23   Meredeth Ide, MD      Allergies    Actos [pioglitazone], Dexmedetomidine, Ibuprofen, Tomato, and Wellbutrin [bupropion]    Review of Systems   Review of Systems  Musculoskeletal:  Positive for neck pain.  All other systems  reviewed and are negative.   Physical Exam Updated Vital Signs BP (!) 164/70 (BP Location: Right Arm)   Pulse 95   Temp 98.9 F (37.2 C)   Resp 18   Ht 5\' 5"  (1.651 m)   Wt 134.1 kg   SpO2 100%   BMI 49.20 kg/m  Physical Exam Vitals and nursing note reviewed.  Constitutional:      Appearance: Normal appearance. He is obese.  HENT:     Head: Normocephalic and atraumatic.     Right Ear: External ear normal.     Left Ear: External ear normal.     Nose: Nose normal.     Mouth/Throat:     Mouth: Mucous membranes are moist.     Pharynx: Oropharynx is clear.  Eyes:     Extraocular Movements: Extraocular movements intact.     Conjunctiva/sclera: Conjunctivae normal.     Pupils: Pupils are equal, round, and reactive to light.  Cardiovascular:     Rate and Rhythm: Normal rate and regular rhythm.     Pulses: Normal pulses.     Heart sounds: Normal heart sounds.  Pulmonary:     Effort: Pulmonary effort is normal.     Breath sounds: Normal breath sounds.  Abdominal:     General: Abdomen is flat. Bowel sounds are normal.     Palpations: Abdomen is soft.  Musculoskeletal:     Cervical back: Normal range of motion and neck supple.     Comments: LUE AVF with + thrill  Left aka  Skin:    General: Skin is warm.     Capillary Refill: Capillary refill takes less than 2 seconds.  Neurological:     General: No focal deficit present.     Mental Status: He is alert and oriented to person, place, and time.  Psychiatric:        Mood and Affect: Mood normal.        Behavior: Behavior normal.     ED Results / Procedures / Treatments   Labs (all labs ordered are listed, but only abnormal results are displayed) Labs Reviewed - No data to display  EKG None  Radiology No results found.  Procedures Procedures    Medications Ordered in ED Medications  diazepam (VALIUM) tablet 5 mg (has no administration in time range)  oxyCODONE (Oxy IR/ROXICODONE) immediate release tablet 5  mg (has no administration in time range)  Darbepoetin Alfa (ARANESP) injection 200 mcg (has no administration in time range)  Chlorhexidine Gluconate Cloth 2 % PADS 6 each (has no administration in time range)    ED Course/ Medical Decision Making/ A&P  Medical Decision Making Risk Prescription drug management.   This patient presents to the ED for concern of needs dialysis, this involves an extensive number of treatment options, and is a complaint that carries with it a high risk of complications and morbidity.  The differential diagnosis includes ESRD on HD   Co morbidities that complicate the patient evaluation  ESRD on HD (Tu, Th and Sat), DM2, COPD, CHF, PTSD, CAD, sleep apnea, and ambulatory dysfunction   Additional history obtained:  Additional history obtained from epic chart review  Medicines ordered and prescription drug management:  I ordered medication including valium and oxy  for neck pain  Reevaluation of the patient after these medicines showed that the patient improved I have reviewed the patients home medicines and have made adjustments as needed  Consultations Obtained:  I requested consultation with the nephrologist (Dr. Arlean Hopping),  and discussed lab and imaging findings as well as pertinent plan - he will take him to dialysis today   Problem List / ED Course:  ESRD on HD:  pt will go to dialysis and then come back to the ED for recheck; then likely go home. Neck pain:  chronic.  He's been told to follow up with NS in the past.  Social Determinants of Health:  Lives at home   Dispostion:  After consideration of the diagnostic results and the patients response to treatment, I feel that the patent would benefit from HD, then likely d/c.          Final Clinical Impression(s) / ED Diagnoses Final diagnoses:  ESRD (end stage renal disease) (HCC)  Neck pain    Rx / DC Orders ED Discharge Orders     None          Jacalyn Lefevre, MD 02/03/24 [phone]

## 2024-02-03 NOTE — ED Notes (Signed)
 Dialysis contacted and reports they currently do not have a bay open.  When something opens, they will send for him.

## 2024-02-03 NOTE — Progress Notes (Signed)
 Pt refused to come to HD when transport went to pick up.

## 2024-02-03 NOTE — ED Notes (Signed)
Consent at bedside.  

## 2024-02-03 NOTE — ED Triage Notes (Signed)
 Pt here for dialysis and neck pain.

## 2024-02-03 NOTE — Procedures (Signed)
 Asked to see patient for hospital dialysis. Patient is not being admitted. The plan will be for "ED HD". Pt will go to the dialysis unit upstairs when HD is ready, and when dialysis is completed pt will be sent back to ED for reassessment.      Last OP HD orders (dec 2023):  3:15h  129kg  RUE AVG   Heparin 2000       Clinical issues: 1) Anemia of esrd:  ** Pt dose not have an outpatient nephrologist or HD unit so does not have access to ESA and other needed medications.  ** pt was getting darbe in spring 2024 May but Hb continued to drop, so dosing of darbepoeitin was increased up to 100 then 150 mcg q 1-2 wks.  Also pt required two 1 gm IV Fe loading programs during the summer and early fall to get the Hb to respond up to Hb 8-10. In Sept / Oct 2024, Hb dropped down back into the 8's and the darbe was ^'d to 200 mcg (every 1-2 wks). In Feb 2025 Hb is in the low 9s, in March Hb 8.8. Will need darbe today.    Plan: Last dose of darbepoetin 200 mcg was 01/18/24. Will order for today in ED.  Darbepoeitin needs to ordered as "once"  or "once in dialysis". Otherwise the order will default to "SQ at 1800" but that timing is not good because he typically needs to be ready to leave at 5 pm to catch his ride home.  I was present at the procedure, reviewed the HD regimen and made appropriate changes.   Vinson Moselle MD  [facility] 02/03/2024, 6:09 PM

## 2024-02-06 ENCOUNTER — Encounter (HOSPITAL_COMMUNITY): Payer: Self-pay

## 2024-02-06 ENCOUNTER — Other Ambulatory Visit: Payer: Self-pay

## 2024-02-06 ENCOUNTER — Emergency Department (HOSPITAL_COMMUNITY)
Admission: EM | Admit: 2024-02-06 | Discharge: 2024-02-06 | Disposition: A | Discharge status: Home / Self Care | Attending: Emergency Medicine | Admitting: Emergency Medicine

## 2024-02-06 DIAGNOSIS — Z7982 Long term (current) use of aspirin: Secondary | ICD-10-CM | POA: Insufficient documentation

## 2024-02-06 DIAGNOSIS — Z79899 Other long term (current) drug therapy: Secondary | ICD-10-CM | POA: Insufficient documentation

## 2024-02-06 DIAGNOSIS — Z7901 Long term (current) use of anticoagulants: Secondary | ICD-10-CM | POA: Diagnosis not present

## 2024-02-06 DIAGNOSIS — N186 End stage renal disease: Secondary | ICD-10-CM | POA: Insufficient documentation

## 2024-02-06 DIAGNOSIS — I132 Hypertensive heart and chronic kidney disease with heart failure and with stage 5 chronic kidney disease, or end stage renal disease: Secondary | ICD-10-CM | POA: Insufficient documentation

## 2024-02-06 DIAGNOSIS — I509 Heart failure, unspecified: Secondary | ICD-10-CM | POA: Insufficient documentation

## 2024-02-06 DIAGNOSIS — E1122 Type 2 diabetes mellitus with diabetic chronic kidney disease: Secondary | ICD-10-CM | POA: Diagnosis not present

## 2024-02-06 DIAGNOSIS — Z794 Long term (current) use of insulin: Secondary | ICD-10-CM | POA: Diagnosis not present

## 2024-02-06 DIAGNOSIS — J449 Chronic obstructive pulmonary disease, unspecified: Secondary | ICD-10-CM | POA: Insufficient documentation

## 2024-02-06 DIAGNOSIS — Z992 Dependence on renal dialysis: Secondary | ICD-10-CM | POA: Diagnosis not present

## 2024-02-06 LAB — RENAL FUNCTION PANEL
Albumin: 3.5 g/dL (ref 3.5–5.0)
Anion gap: 11 (ref 5–15)
BUN: 93 mg/dL — ABNORMAL HIGH (ref 8–23)
CO2: 28 mmol/L (ref 22–32)
Calcium: 8.1 mg/dL — ABNORMAL LOW (ref 8.9–10.3)
Chloride: 102 mmol/L (ref 98–111)
Creatinine, Ser: 6.97 mg/dL — ABNORMAL HIGH (ref 0.61–1.24)
GFR, Estimated: 8 mL/min — ABNORMAL LOW (ref >60)
Glucose, Bld: 111 mg/dL — ABNORMAL HIGH (ref 70–99)
Phosphorus: 4.5 mg/dL (ref 2.5–4.6)
Potassium: 4.3 mmol/L (ref 3.5–5.1)
Sodium: 141 mmol/L (ref 135–145)

## 2024-02-06 LAB — CBC
HCT: 29.9 % — ABNORMAL LOW (ref 39.0–52.0)
Hemoglobin: 9.4 g/dL — ABNORMAL LOW (ref 13.0–17.0)
MCH: 28.6 pg (ref 26.0–34.0)
MCHC: 31.4 g/dL (ref 30.0–36.0)
MCV: 90.9 fL (ref 80.0–100.0)
Platelets: 129 10*3/uL — ABNORMAL LOW (ref 150–400)
RBC: 3.29 MIL/uL — ABNORMAL LOW (ref 4.22–5.81)
RDW: 15.1 % (ref 11.5–15.5)
WBC: 6.5 10*3/uL (ref 4.0–10.5)
nRBC: 0 % (ref 0.0–0.2)

## 2024-02-06 LAB — HEPATITIS B SURFACE ANTIGEN: Hepatitis B Surface Ag: NONREACTIVE

## 2024-02-06 MED ORDER — ALTEPLASE 2 MG IJ SOLR: 2.0000 mg | Freq: Once | INTRAMUSCULAR | Status: DC | PRN

## 2024-02-06 MED ORDER — PENTAFLUOROPROP-TETRAFLUOROETH EX AERO: 1.0000 | INHALATION_SPRAY | CUTANEOUS | Status: DC | PRN

## 2024-02-06 MED ORDER — HEPARIN SODIUM (PORCINE) 1000 UNIT/ML DIALYSIS: 1000.0000 [IU] | INTRAMUSCULAR | Status: DC | PRN

## 2024-02-06 MED ORDER — ANTICOAGULANT SODIUM CITRATE 4% (200MG/5ML) IV SOLN
5.0000 mL | Status: DC | PRN
  Filled 2024-02-06: qty 5

## 2024-02-06 MED ORDER — LIDOCAINE-PRILOCAINE 2.5-2.5 % EX CREA: 1.0000 | TOPICAL_CREAM | CUTANEOUS | Status: DC | PRN

## 2024-02-06 MED ORDER — HEPARIN SODIUM (PORCINE) 1000 UNIT/ML DIALYSIS
2000.0000 [IU] | INTRAMUSCULAR | Status: DC | PRN
  Administered 2024-02-06: 2000 [IU] via INTRAVENOUS_CENTRAL
  Filled 2024-02-06 (×2): qty 2

## 2024-02-06 MED ORDER — NEPRO/CARBSTEADY PO LIQD
237.0000 mL | ORAL | Status: DC | PRN
  Filled 2024-02-06: qty 237

## 2024-02-06 MED ORDER — LIDOCAINE HCL (PF) 1 % IJ SOLN: 5.0000 mL | INTRAMUSCULAR | Status: DC | PRN

## 2024-02-06 MED ORDER — OXYCODONE-ACETAMINOPHEN 5-325 MG PO TABS
1.0000 | ORAL_TABLET | ORAL | Status: DC | PRN
  Administered 2024-02-06: 1 via ORAL
  Filled 2024-02-06: qty 1

## 2024-02-06 NOTE — ED Provider Notes (Signed)
 [place] EMERGENCY DEPARTMENT AT [facility] Provider Note   CSN: [id number] Arrival date & time: 02/06/24  0272     History  Chief Complaint  Patient presents with   Needs Dialysis    Jeffrey [name] is a 63 y.o. male.  63 y/o male with hx of DM, CHF, COPD, chronic respiratory failure (2L O2 via [place]), HTN, and ESRD on HD presents to the ED requesting dialysis. Hx of prior ED presentations for same. No acute complaints. Does note some chronic neck pain, improving with percocet ordered upon arrival. No CP, SOB. Dr. Valentino Nose of nephrology is aware of patient's arrival to the ED.  The history is provided by the patient. No language interpreter was used.       Home Medications Prior to Admission medications   Medication Sig Start Date End Date Taking? Authorizing Provider  acetaminophen (TYLENOL) 325 MG tablet Take 2 tablets (650 mg total) by mouth every 6 (six) hours. 07/23/23   Lockie Mola, MD  amLODipine (NORVASC) 10 MG tablet Take 10 mg by mouth every morning. 04/17/20   [provider]  ASPIRIN LOW DOSE 81 MG EC tablet Take 81 mg by mouth in the morning. 02/22/21   [provider]  atorvastatin (LIPITOR) 40 MG tablet Take 40 mg by mouth in the morning. 02/09/21   [provider]  calcium acetate (PHOSLO) 667 MG capsule Take 2 capsules (1,334 mg total) by mouth with breakfast, with lunch, and with evening meal. 12/21/23   Ejigiri, Megan Mans, PA-C  carvedilol (COREG) 12.5 MG tablet Take 12.5 mg by mouth 2 (two) times daily with a meal. 04/17/20   [provider]  clopidogrel (PLAVIX) 75 MG tablet Take 1 tablet (75 mg total) by mouth daily. 11/02/22   Rhyne, Ames Coupe, PA-C  diazepam (VALIUM) 5 MG tablet Take 5 mg by mouth 2 (two) times daily as needed for muscle spasms. 12/09/21   [provider]  Fluticasone-Umeclidin-Vilant (TRELEGY ELLIPTA IN) Inhale 1 puff into the lungs daily.    [provider]  insulin lispro  (HUMALOG KWIKPEN) 100 UNIT/ML KwikPen Inject 0-9 Units into the skin 3 (three) times daily with meals. CBG < 70: treat low blood sugar CBG 70 - 120: 0 units  CBG 121 - 150: 1 unit  CBG 151 - 200: 2 units  CBG 201 - 250: 3 units  CBG 251 - 300: 5 units  CBG 301 - 350: 7 units  CBG 351 - 400: 9 units  CBG > 400: call MD 09/27/23   Zigmund Daniel., MD  insulin regular human CONCENTRATED (HUMULIN R U-500 KWIKPEN) 500 UNIT/ML KwikPen Inject 55 Units into the skin 2 (two) times daily with a meal. Follow up with your PCP and [facility] for additional adjustments 09/27/23 10/27/23  Zigmund Daniel., MD  iron polysaccharides (NIFEREX) 150 MG capsule Take 1 capsule (150 mg total) by mouth daily. 09/26/23   Meredeth Ide, MD  nortriptyline (PAMELOR) 10 MG capsule Take 20 mg by mouth at bedtime.    [provider]  pantoprazole (PROTONIX) 40 MG tablet Take 40 mg by mouth daily before breakfast. 02/09/21   [provider]  pregabalin (LYRICA) 75 MG capsule Take 1 capsule (75 mg total) by mouth See admin instructions. Daily.  Give after dialysis on dialysis days. Patient taking differently: Take 75 mg by mouth daily. 01/21/23   [name], MD  sertraline (ZOLOFT) 100 MG tablet Take 100 mg by  mouth in the morning. 02/22/21   [provider]  tamsulosin (FLOMAX) 0.4 MG CAPS capsule Take 1 capsule (0.4 mg total) by mouth daily. 11/10/21   [name], MD  torsemide (DEMADEX) 20 MG tablet Take 120 mg by mouth 2 (two) times daily.    [provider]  traZODone (DESYREL) 150 MG tablet Take 150 mg by mouth at bedtime. 12/09/21   [provider]  Vitamin D, Ergocalciferol, (DRISDOL) 1.25 MG (50000 UNIT) CAPS capsule Take 1 capsule (50,000 Units total) by mouth every 7 (seven) days. 10/02/23   Meredeth Ide, MD      Allergies    Actos [pioglitazone], Dexmedetomidine, Ibuprofen, Tomato, and Wellbutrin [bupropion]    Review of Systems   Review of  Systems Ten systems reviewed and are negative for acute change, except as noted in the HPI.    Physical Exam Updated Vital Signs BP (!) 165/99   Pulse 84   Temp 98.6 F (37 C) (Oral)   Resp 17   SpO2 98%   Physical Exam Vitals and nursing note reviewed.  Constitutional:      General: He is not in acute distress.    Appearance: He is well-developed. He is not diaphoretic.     Comments: Singing along with music from portable speaker. Pleasant, nontoxic appearing.  HENT:     Head: Normocephalic and atraumatic.  Eyes:     General: No scleral icterus.    Conjunctiva/sclera: Conjunctivae normal.  Pulmonary:     Effort: Pulmonary effort is normal. No respiratory distress.     Comments: Respirations even and unlabored Musculoskeletal:        General: Normal range of motion.     Cervical back: Normal range of motion.     Comments: Left AKA  Skin:    General: Skin is warm and dry.     Coloration: Skin is not pale.     Findings: No erythema or rash.  Neurological:     Mental Status: He is alert and oriented to person, place, and time.  Psychiatric:        Behavior: Behavior normal.     ED Results / Procedures / Treatments   Labs (all labs ordered are listed, but only abnormal results are displayed) Labs Reviewed  CBC - Abnormal; Notable for the following components:      Result Value   RBC 3.29 (*)    Hemoglobin 9.4 (*)    HCT 29.9 (*)    Platelets 129 (*)    All other components within normal limits  RENAL FUNCTION PANEL - Abnormal; Notable for the following components:   Glucose, Bld 111 (*)    BUN 93 (*)    Creatinine, Ser 6.97 (*)    Calcium 8.1 (*)    GFR, Estimated 8 (*)    All other components within normal limits  HEPATITIS B SURFACE ANTIGEN  HEPATITIS B SURFACE ANTIBODY, QUANTITATIVE    EKG None  Radiology No results found.  Procedures Procedures    Medications Ordered in ED Medications  oxyCODONE-acetaminophen (PERCOCET/ROXICET) 5-325 MG per  tablet 1 tablet (1 tablet Oral Given 02/06/24 0825)    ED Course/ Medical Decision Making/ A&P Clinical Course as of 02/06/24 1526  Tue Feb 06, 2024  0901 Dr. Valentino Nose of Nephrology made aware of patient's placement in ED. Nephrology APP coordinating scheduled dialysis for today. Labs pending. [KH]    Clinical Course User Index [KH] Antony Madura, PA-C  Medical Decision Making Risk Decision regarding hospitalization.   Patient presenting for routine dialysis. No acute complaints. Nephrology aware.  Transported to floor for dialysis. VSS.        Final Clinical Impression(s) / ED Diagnoses Final diagnoses:  ESRD on hemodialysis [facility])    Rx / DC Orders ED Discharge Orders     None         Antony Madura, PA-C 02/06/24 1526    Gerhard Munch, MD 02/06/24 1537

## 2024-02-06 NOTE — ED Notes (Signed)
 Consent signed.

## 2024-02-06 NOTE — ED Triage Notes (Signed)
 Pt came in via POV requesting dialysis. Denies any CP/SOB, does have 9/10 chronic neck pain.

## 2024-02-06 NOTE — Progress Notes (Signed)
 Received patient in bed to unit.  Alert and oriented.  Informed consent signed and in chart.   TX duration: 2 hours and 47 minutes.  Patient came off of machine early due to massive cramp in Right leg.  PA Ejigiri informed.  Patient tolerated well.  Transported back to the room  Alert, without acute distress.  Hand-off given to patient's nurse.   Access used: Left Upper Arm Graft Access issues: none  Total UF removed: 2.1L Medication(s) given: 100 cc bolus at end of treatment to help with cramp   02/06/24 1244  Vitals  Temp 98.6 F (37 C)  Temp Source Oral  BP (!) 170/119  Pulse Rate 85  ECG Heart Rate 86  Resp 17  Oxygen Therapy  SpO2 99 %  O2 Device Nasal Cannula  O2 Flow Rate (L/min) 3 L/min  During Treatment Monitoring  HD Safety Checks Performed Yes  Intra-Hemodialysis Comments  (Terminated treatment due to cramps.  PA Ejigiri informed)  Dialysis Fluid Bolus Normal Saline  Bolus Amount (mL) 300 mL  Post Treatment  Dialyzer Clearance Lightly streaked  Liters Processed 66.6  Fluid Removed (mL) 2100 mL  Tolerated HD Treatment No (Comment)  Post-Hemodialysis Comments Patient experience massive cramp with 45 mintes left in session. Terminated earlty  Fistula / Graft Left Upper arm Arteriovenous vein graft  Placement Date/Time: (c) 04/22/23 (c) 1000   Orientation: Left  Access Location: Upper arm  Access Type: Arteriovenous vein graft  Status Deaccessed     Stacie Glaze LPN [facility]

## 2024-02-07 LAB — HEPATITIS B SURFACE ANTIBODY, QUANTITATIVE: Hep B S AB Quant (Post): 32 m[IU]/mL

## 2024-02-08 ENCOUNTER — Emergency Department (HOSPITAL_COMMUNITY)
Admission: EM | Admit: 2024-02-08 | Discharge: 2024-02-08 | Discharge status: Against Medical Advice | Attending: Emergency Medicine | Admitting: Emergency Medicine

## 2024-02-08 ENCOUNTER — Other Ambulatory Visit: Payer: Self-pay

## 2024-02-08 ENCOUNTER — Encounter (HOSPITAL_COMMUNITY): Payer: Self-pay

## 2024-02-08 DIAGNOSIS — E1122 Type 2 diabetes mellitus with diabetic chronic kidney disease: Secondary | ICD-10-CM | POA: Insufficient documentation

## 2024-02-08 DIAGNOSIS — I509 Heart failure, unspecified: Secondary | ICD-10-CM | POA: Insufficient documentation

## 2024-02-08 DIAGNOSIS — M542 Cervicalgia: Secondary | ICD-10-CM | POA: Insufficient documentation

## 2024-02-08 DIAGNOSIS — Z7982 Long term (current) use of aspirin: Secondary | ICD-10-CM | POA: Insufficient documentation

## 2024-02-08 DIAGNOSIS — N186 End stage renal disease: Secondary | ICD-10-CM | POA: Diagnosis not present

## 2024-02-08 DIAGNOSIS — Z992 Dependence on renal dialysis: Secondary | ICD-10-CM | POA: Insufficient documentation

## 2024-02-08 DIAGNOSIS — Z79899 Other long term (current) drug therapy: Secondary | ICD-10-CM | POA: Insufficient documentation

## 2024-02-08 DIAGNOSIS — Z7901 Long term (current) use of anticoagulants: Secondary | ICD-10-CM | POA: Insufficient documentation

## 2024-02-08 DIAGNOSIS — J449 Chronic obstructive pulmonary disease, unspecified: Secondary | ICD-10-CM | POA: Diagnosis not present

## 2024-02-08 DIAGNOSIS — Z794 Long term (current) use of insulin: Secondary | ICD-10-CM | POA: Diagnosis not present

## 2024-02-08 DIAGNOSIS — I132 Hypertensive heart and chronic kidney disease with heart failure and with stage 5 chronic kidney disease, or end stage renal disease: Secondary | ICD-10-CM | POA: Diagnosis present

## 2024-02-08 LAB — RENAL FUNCTION PANEL
Albumin: 3.5 g/dL (ref 3.5–5.0)
Anion gap: 16 — ABNORMAL HIGH (ref 5–15)
BUN: 71 mg/dL — ABNORMAL HIGH (ref 8–23)
CO2: 26 mmol/L (ref 22–32)
Calcium: 7.9 mg/dL — ABNORMAL LOW (ref 8.9–10.3)
Chloride: 97 mmol/L — ABNORMAL LOW (ref 98–111)
Creatinine, Ser: 6.35 mg/dL — ABNORMAL HIGH (ref 0.61–1.24)
GFR, Estimated: 9 mL/min — ABNORMAL LOW (ref >60)
Glucose, Bld: 233 mg/dL — ABNORMAL HIGH (ref 70–99)
Phosphorus: 4.4 mg/dL (ref 2.5–4.6)
Potassium: 4.3 mmol/L (ref 3.5–5.1)
Sodium: 139 mmol/L (ref 135–145)

## 2024-02-08 LAB — CBC
HCT: 29.8 % — ABNORMAL LOW (ref 39.0–52.0)
Hemoglobin: 9.2 g/dL — ABNORMAL LOW (ref 13.0–17.0)
MCH: 28.7 pg (ref 26.0–34.0)
MCHC: 30.9 g/dL (ref 30.0–36.0)
MCV: 92.8 fL (ref 80.0–100.0)
Platelets: 112 10*3/uL — ABNORMAL LOW (ref 150–400)
RBC: 3.21 MIL/uL — ABNORMAL LOW (ref 4.22–5.81)
RDW: 15 % (ref 11.5–15.5)
WBC: 6.4 10*3/uL (ref 4.0–10.5)
nRBC: 0 % (ref 0.0–0.2)

## 2024-02-08 MED ORDER — CHLORHEXIDINE GLUCONATE CLOTH 2 % EX PADS: 6.0000 | MEDICATED_PAD | Freq: Every day | CUTANEOUS | Status: DC

## 2024-02-08 MED ORDER — OXYCODONE-ACETAMINOPHEN 5-325 MG PO TABS
1.0000 | ORAL_TABLET | Freq: Once | ORAL | Status: AC
  Administered 2024-02-08: 1 via ORAL
  Filled 2024-02-08: qty 1

## 2024-02-08 MED ORDER — PENTAFLUOROPROP-TETRAFLUOROETH EX AERO: 1.0000 | INHALATION_SPRAY | CUTANEOUS | Status: DC | PRN

## 2024-02-08 MED ORDER — HEPARIN SODIUM (PORCINE) 1000 UNIT/ML DIALYSIS: 1000.0000 [IU] | INTRAMUSCULAR | Status: DC | PRN

## 2024-02-08 MED ORDER — LIDOCAINE-PRILOCAINE 2.5-2.5 % EX CREA: 1.0000 | TOPICAL_CREAM | CUTANEOUS | Status: DC | PRN

## 2024-02-08 MED ORDER — HEPARIN SODIUM (PORCINE) 1000 UNIT/ML DIALYSIS
2000.0000 [IU] | INTRAMUSCULAR | Status: DC | PRN
  Administered 2024-02-08: 2000 [IU] via INTRAVENOUS_CENTRAL
  Filled 2024-02-08 (×2): qty 2
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
## 2024-02-08 NOTE — ED Notes (Signed)
 Pt returned from dialysis, no acute distress noted. Left via personal wheelchair prior to receiving discharge instructions or vitals.

## 2024-02-08 NOTE — ED Triage Notes (Signed)
 Pt here for dialysis . 45 minutes missing from dialysis on Tuesday. Neck pain continuous.

## 2024-02-08 NOTE — Progress Notes (Signed)
 Received patient in stretcher bed.Awake,alert and oriented x 4.Consent verified.  Accessed used : Left avf that worked well.  Duration of treatment: 3 HOURS.  Net uf goal Met 2600 cc.  Medicines given : Heparin 2,000 units pre-run dose.                              Percocet 1 tab.  Hemo comment:  Had bad cramping near the end of the treatment:  Hand off to the patient's nurse.

## 2024-02-15 ENCOUNTER — Encounter (HOSPITAL_COMMUNITY): Payer: Self-pay

## 2024-02-15 ENCOUNTER — Other Ambulatory Visit: Payer: Self-pay

## 2024-02-15 ENCOUNTER — Emergency Department (HOSPITAL_COMMUNITY)
Admission: EM | Admit: 2024-02-15 | Discharge: 2024-02-15 | Discharge status: Against Medical Advice | Attending: Emergency Medicine | Admitting: Emergency Medicine

## 2024-02-15 DIAGNOSIS — Z992 Dependence on renal dialysis: Secondary | ICD-10-CM | POA: Insufficient documentation

## 2024-02-15 DIAGNOSIS — M542 Cervicalgia: Secondary | ICD-10-CM | POA: Insufficient documentation

## 2024-02-15 DIAGNOSIS — Z794 Long term (current) use of insulin: Secondary | ICD-10-CM | POA: Insufficient documentation

## 2024-02-15 DIAGNOSIS — I509 Heart failure, unspecified: Secondary | ICD-10-CM | POA: Diagnosis not present

## 2024-02-15 DIAGNOSIS — I132 Hypertensive heart and chronic kidney disease with heart failure and with stage 5 chronic kidney disease, or end stage renal disease: Secondary | ICD-10-CM | POA: Diagnosis not present

## 2024-02-15 DIAGNOSIS — Z79899 Other long term (current) drug therapy: Secondary | ICD-10-CM | POA: Diagnosis not present

## 2024-02-15 DIAGNOSIS — N186 End stage renal disease: Secondary | ICD-10-CM | POA: Insufficient documentation

## 2024-02-15 DIAGNOSIS — Z7982 Long term (current) use of aspirin: Secondary | ICD-10-CM | POA: Diagnosis not present

## 2024-02-15 DIAGNOSIS — E1122 Type 2 diabetes mellitus with diabetic chronic kidney disease: Secondary | ICD-10-CM | POA: Insufficient documentation

## 2024-02-15 MED ORDER — HEPARIN SODIUM (PORCINE) 1000 UNIT/ML DIALYSIS: 1000.0000 [IU] | INTRAMUSCULAR | Status: DC | PRN

## 2024-02-15 MED ORDER — ALTEPLASE 2 MG IJ SOLR: 2.0000 mg | Freq: Once | INTRAMUSCULAR | Status: DC | PRN

## 2024-02-15 MED ORDER — HEPARIN SODIUM (PORCINE) 1000 UNIT/ML DIALYSIS
2000.0000 [IU] | Freq: Once | INTRAMUSCULAR | Status: AC
  Administered 2024-02-15: 2000 [IU] via INTRAVENOUS_CENTRAL

## 2024-02-15 MED ORDER — CHLORHEXIDINE GLUCONATE CLOTH 2 % EX PADS: 6.0000 | MEDICATED_PAD | Freq: Every day | CUTANEOUS | Status: DC

## 2024-02-15 MED ORDER — OXYCODONE-ACETAMINOPHEN 5-325 MG PO TABS
1.0000 | ORAL_TABLET | Freq: Once | ORAL | Status: AC
  Administered 2024-02-15: 1 via ORAL
  Filled 2024-02-15: qty 1

## 2024-02-15 MED ORDER — LIDOCAINE-PRILOCAINE 2.5-2.5 % EX CREA: 1.0000 | TOPICAL_CREAM | CUTANEOUS | Status: DC | PRN

## 2024-02-15 MED ORDER — PENTAFLUOROPROP-TETRAFLUOROETH EX AERO: 1.0000 | INHALATION_SPRAY | CUTANEOUS | Status: DC | PRN

## 2024-02-15 MED ORDER — LIDOCAINE HCL (PF) 1 % IJ SOLN: 5.0000 mL | INTRAMUSCULAR | Status: DC | PRN

## 2024-02-15 MED ORDER — HEPARIN SODIUM (PORCINE) 1000 UNIT/ML IJ SOLN
INTRAMUSCULAR | Status: DC
Start: 2024-02-15 — End: 2024-02-15
  Filled 2024-02-15: qty 2

## 2024-02-15 MED ORDER — FENTANYL CITRATE (PF) 100 MCG/2ML IJ SOLN
INTRAMUSCULAR | Status: AC
  Filled 2024-02-15: qty 2

## 2024-02-15 MED ORDER — MIDAZOLAM HCL 2 MG/2ML IJ SOLN
INTRAMUSCULAR | Status: AC
  Filled 2024-02-15: qty 2

## 2024-02-15 NOTE — ED Provider Notes (Signed)
 [place] EMERGENCY DEPARTMENT AT [facility] Provider Note   CSN: [id number] Arrival date & time: 02/15/24  0725     History  Chief Complaint  Patient presents with   Dialysis    Jeffrey [name] is a 63 y.o. male with a hx of ESRD on HD T/TH/Sat, CHF, T2DM, HTN, and chronic pain who presents to the ED for dialysis. He does not have an outpatient dialysis center and has been coming to the ED to receive this care. He gets his dialysis via his LUE AVF. His only compliant at this time is his chronic neck pain, denies acute change, no recent injuries. Denies chest pain, dyspnea, numbness, or weakness.    HPI     Home Medications Prior to Admission medications   Medication Sig Start Date End Date Taking? Authorizing Provider  acetaminophen (TYLENOL) 325 MG tablet Take 2 tablets (650 mg total) by mouth every 6 (six) hours. 07/23/23   Lockie Mola, MD  amLODipine (NORVASC) 10 MG tablet Take 10 mg by mouth every morning. 04/17/20   [provider]  ASPIRIN LOW DOSE 81 MG EC tablet Take 81 mg by mouth in the morning. 02/22/21   [provider]  atorvastatin (LIPITOR) 40 MG tablet Take 40 mg by mouth in the morning. 02/09/21   [provider]  calcium acetate (PHOSLO) 667 MG capsule Take 2 capsules (1,334 mg total) by mouth with breakfast, with lunch, and with evening meal. 12/21/23   Ejigiri, Megan Mans, PA-C  carvedilol (COREG) 12.5 MG tablet Take 12.5 mg by mouth 2 (two) times daily with a meal. 04/17/20   [provider]  clopidogrel (PLAVIX) 75 MG tablet Take 1 tablet (75 mg total) by mouth daily. 11/02/22   Rhyne, Ames Coupe, PA-C  diazepam (VALIUM) 5 MG tablet Take 5 mg by mouth 2 (two) times daily as needed for muscle spasms. 12/09/21   [provider]  Fluticasone-Umeclidin-Vilant (TRELEGY ELLIPTA IN) Inhale 1 puff into the lungs daily.    [provider]  insulin lispro (HUMALOG KWIKPEN) 100 UNIT/ML KwikPen Inject 0-9 Units  into the skin 3 (three) times daily with meals. CBG < 70: treat low blood sugar CBG 70 - 120: 0 units  CBG 121 - 150: 1 unit  CBG 151 - 200: 2 units  CBG 201 - 250: 3 units  CBG 251 - 300: 5 units  CBG 301 - 350: 7 units  CBG 351 - 400: 9 units  CBG > 400: call MD 09/27/23   Zigmund Daniel., MD  insulin regular human CONCENTRATED (HUMULIN R U-500 KWIKPEN) 500 UNIT/ML KwikPen Inject 55 Units into the skin 2 (two) times daily with a meal. Follow up with your PCP and [facility] for additional adjustments 09/27/23 10/27/23  Zigmund Daniel., MD  iron polysaccharides (NIFEREX) 150 MG capsule Take 1 capsule (150 mg total) by mouth daily. 09/26/23   Meredeth Ide, MD  nortriptyline (PAMELOR) 10 MG capsule Take 20 mg by mouth at bedtime.    [provider]  pantoprazole (PROTONIX) 40 MG tablet Take 40 mg by mouth daily before breakfast. 02/09/21   [provider]  pregabalin (LYRICA) 75 MG capsule Take 1 capsule (75 mg total) by mouth See admin instructions. Daily.  Give after dialysis on dialysis days. Patient taking differently: Take 75 mg by mouth daily. 01/21/23   [name], MD  sertraline (ZOLOFT) 100 MG tablet Take 100 mg by mouth in the morning. 02/22/21  [provider]  tamsulosin (FLOMAX) 0.4 MG CAPS capsule Take 1 capsule (0.4 mg total) by mouth daily. 11/10/21   [name], MD  torsemide (DEMADEX) 20 MG tablet Take 120 mg by mouth 2 (two) times daily.    [provider]  traZODone (DESYREL) 150 MG tablet Take 150 mg by mouth at bedtime. 12/09/21   [provider]  Vitamin D, Ergocalciferol, (DRISDOL) 1.25 MG (50000 UNIT) CAPS capsule Take 1 capsule (50,000 Units total) by mouth every 7 (seven) days. 10/02/23   Meredeth Ide, MD      Allergies    Actos [pioglitazone], Dexmedetomidine, Ibuprofen, Tomato, and Wellbutrin [bupropion]    Review of Systems   Review of Systems  Constitutional:  Negative for chills and fever.   Respiratory:  Negative for shortness of breath.   Cardiovascular:  Negative for chest pain.  Gastrointestinal:  Negative for abdominal pain and vomiting.  Musculoskeletal:  Positive for neck pain.  Neurological:  Negative for weakness and numbness.  All other systems reviewed and are negative.   Physical Exam Updated Vital Signs BP (!) 162/69 (BP Location: Right Arm)   Pulse 78   Temp 98.1 F (36.7 C) (Oral)   Resp 16   SpO2 100%  Physical Exam Vitals and nursing note reviewed.  Constitutional:      General: He is not in acute distress.    Appearance: He is not toxic-appearing.  HENT:     Head: Normocephalic and atraumatic.  Cardiovascular:     Rate and Rhythm: Normal rate and regular rhythm.     Pulses:          Radial pulses are 2+ on the right side and 2+ on the left side.  Pulmonary:     Effort: Pulmonary effort is normal. No respiratory distress.     Breath sounds: Normal breath sounds. No wheezing.     Comments: Respirations even/unlabored on 2L via [place].  Abdominal:     Palpations: Abdomen is soft.     Tenderness: There is no abdominal tenderness.  Musculoskeletal:     Cervical back: Neck supple. Muscular tenderness (left sided) present. No spinous process tenderness.     Comments: LUE: AV fistula w/ palpable thrill LLE: AKA RLE: edema present.   Skin:    General: Skin is warm and dry.  Neurological:     Mental Status: He is alert.     ED Results / Procedures / Treatments   Labs (all labs ordered are listed, but only abnormal results are displayed) Labs Reviewed  HEPATITIS B SURFACE ANTIGEN  HEPATITIS B SURFACE ANTIBODY, QUANTITATIVE    EKG None  Radiology No results found.  Procedures Procedures    Medications Ordered in ED Medications  Chlorhexidine Gluconate Cloth 2 % PADS 6 each (has no administration in time range)    ED Course/ Medical Decision Making/ A&P                                 Medical Decision Making Risk Prescription  drug management.   Patient presents to the ED for dialysis. Complaining of chronic unchanged neck pain.  Nontoxic, vitals w/ hypertension, BP 162/69- similar to prior.   Additional history obtained:  Chart/nursing notes reviewed.   ED Course:  I ordered medications including percocet for his neck pain.   8:20: CONSULT: Discussed with nephrologist Dr. Arlean Hopping- aware of patient, plan for dialysis.   Taken to dialysis  Portions of this note were generated with Scientist, clinical (histocompatibility and immunogenetics). Dictation errors may occur despite best attempts at proofreading.        Final Clinical Impression(s) / ED Diagnoses Final diagnoses:  ESRD (end stage renal disease) [facility])    Rx / DC Orders ED Discharge Orders     None         Cherly Anderson, PA-C 02/15/24 1604    Pricilla Loveless, MD 02/16/24 1257

## 2024-02-15 NOTE — ED Triage Notes (Signed)
 Pt came in via POV d/t requesting dialysis, states he hasn't had a Tx since Thursday last week. Denies any new SOB, just chronic pain in his neck, rates pain 10/10 during triage.

## 2024-02-17 ENCOUNTER — Emergency Department (HOSPITAL_COMMUNITY)
Admission: EM | Admit: 2024-02-17 | Discharge: 2024-02-17 | Disposition: A | Discharge status: Home / Self Care | Attending: Emergency Medicine | Admitting: Emergency Medicine

## 2024-02-17 ENCOUNTER — Encounter (HOSPITAL_COMMUNITY): Payer: Self-pay

## 2024-02-17 DIAGNOSIS — Z992 Dependence on renal dialysis: Secondary | ICD-10-CM | POA: Insufficient documentation

## 2024-02-17 DIAGNOSIS — Z7902 Long term (current) use of antithrombotics/antiplatelets: Secondary | ICD-10-CM | POA: Diagnosis not present

## 2024-02-17 DIAGNOSIS — Z794 Long term (current) use of insulin: Secondary | ICD-10-CM | POA: Insufficient documentation

## 2024-02-17 DIAGNOSIS — Z7982 Long term (current) use of aspirin: Secondary | ICD-10-CM | POA: Diagnosis not present

## 2024-02-17 DIAGNOSIS — Z79899 Other long term (current) drug therapy: Secondary | ICD-10-CM | POA: Insufficient documentation

## 2024-02-17 DIAGNOSIS — I5030 Unspecified diastolic (congestive) heart failure: Secondary | ICD-10-CM | POA: Diagnosis not present

## 2024-02-17 DIAGNOSIS — J449 Chronic obstructive pulmonary disease, unspecified: Secondary | ICD-10-CM | POA: Diagnosis not present

## 2024-02-17 DIAGNOSIS — E1122 Type 2 diabetes mellitus with diabetic chronic kidney disease: Secondary | ICD-10-CM | POA: Insufficient documentation

## 2024-02-17 DIAGNOSIS — N186 End stage renal disease: Secondary | ICD-10-CM | POA: Diagnosis present

## 2024-02-17 DIAGNOSIS — I132 Hypertensive heart and chronic kidney disease with heart failure and with stage 5 chronic kidney disease, or end stage renal disease: Secondary | ICD-10-CM | POA: Diagnosis not present

## 2024-02-17 LAB — RENAL FUNCTION PANEL
Albumin: 3.8 g/dL (ref 3.5–5.0)
Anion gap: 13 (ref 5–15)
BUN: 82 mg/dL — ABNORMAL HIGH (ref 8–23)
CO2: 23 mmol/L (ref 22–32)
Calcium: 7.7 mg/dL — ABNORMAL LOW (ref 8.9–10.3)
Chloride: 98 mmol/L (ref 98–111)
Creatinine, Ser: 5.75 mg/dL — ABNORMAL HIGH (ref 0.61–1.24)
GFR, Estimated: 10 mL/min — ABNORMAL LOW (ref >60)
Glucose, Bld: 330 mg/dL — ABNORMAL HIGH (ref 70–99)
Phosphorus: 5.8 mg/dL — ABNORMAL HIGH (ref 2.5–4.6)
Potassium: 5.3 mmol/L — ABNORMAL HIGH (ref 3.5–5.1)
Sodium: 134 mmol/L — ABNORMAL LOW (ref 135–145)

## 2024-02-17 LAB — CBC
HCT: 32.1 % — ABNORMAL LOW (ref 39.0–52.0)
Hemoglobin: 10.1 g/dL — ABNORMAL LOW (ref 13.0–17.0)
MCH: 28.8 pg (ref 26.0–34.0)
MCHC: 31.5 g/dL (ref 30.0–36.0)
MCV: 91.5 fL (ref 80.0–100.0)
Platelets: 141 10*3/uL — ABNORMAL LOW (ref 150–400)
RBC: 3.51 MIL/uL — ABNORMAL LOW (ref 4.22–5.81)
RDW: 15.5 % (ref 11.5–15.5)
WBC: 7.5 10*3/uL (ref 4.0–10.5)
nRBC: 0 % (ref 0.0–0.2)

## 2024-02-17 MED ORDER — HEPARIN SODIUM (PORCINE) 1000 UNIT/ML DIALYSIS: 1000.0000 [IU] | INTRAMUSCULAR | Status: DC | PRN

## 2024-02-17 MED ORDER — HEPARIN SODIUM (PORCINE) 1000 UNIT/ML DIALYSIS: 2000.0000 [IU] | INTRAMUSCULAR | Status: DC | PRN

## 2024-02-17 MED ORDER — OXYCODONE-ACETAMINOPHEN 5-325 MG PO TABS
1.0000 | ORAL_TABLET | Freq: Once | ORAL | Status: AC
  Administered 2024-02-17: 1 via ORAL
  Filled 2024-02-17: qty 1

## 2024-02-17 MED ORDER — HEPARIN SODIUM (PORCINE) 1000 UNIT/ML IJ SOLN
INTRAMUSCULAR | Status: AC
  Filled 2024-02-17: qty 2

## 2024-02-17 MED ORDER — CHLORHEXIDINE GLUCONATE CLOTH 2 % EX PADS: 6.0000 | MEDICATED_PAD | Freq: Every day | CUTANEOUS | Status: DC

## 2024-02-17 MED ORDER — DARBEPOETIN ALFA 200 MCG/0.4ML IJ SOSY
200.0000 ug | PREFILLED_SYRINGE | Freq: Once | INTRAMUSCULAR | Status: DC
  Filled 2024-02-17: qty 0.4

## 2024-02-17 NOTE — ED Notes (Signed)
 Pt has returned from dialysis. He appears in no distress. 2.4 Liters removed during his treatment. He states he is leaving. Pt wheeled himself out of the ED.

## 2024-02-17 NOTE — ED Provider Notes (Signed)
 [place] EMERGENCY DEPARTMENT AT [facility] Provider Note   CSN: [id number] Arrival date & time: 02/17/24  2841     History  Chief Complaint  Patient presents with   Routine Dialysis   Neck Pain    Jeffrey [name] is a 63 y.o. male.  Past medical history significant for ESRD on HD T/TH/Sat, COPD, DM, hypertension, diastolic heart failure, and OSA presents today for dialysis.  Patient does not have an outpatient dialysis center has been coming to the ED to receive his care.   Neck Pain      Home Medications Prior to Admission medications   Medication Sig Start Date End Date Taking? Authorizing Provider  acetaminophen (TYLENOL) 325 MG tablet Take 2 tablets (650 mg total) by mouth every 6 (six) hours. 07/23/23   Lockie Mola, MD  amLODipine (NORVASC) 10 MG tablet Take 10 mg by mouth every morning. 04/17/20   [provider]  ASPIRIN LOW DOSE 81 MG EC tablet Take 81 mg by mouth in the morning. 02/22/21   [provider]  atorvastatin (LIPITOR) 40 MG tablet Take 40 mg by mouth in the morning. 02/09/21   [provider]  calcium acetate (PHOSLO) 667 MG capsule Take 2 capsules (1,334 mg total) by mouth with breakfast, with lunch, and with evening meal. 12/21/23   Ejigiri, Megan Mans, PA-C  carvedilol (COREG) 12.5 MG tablet Take 12.5 mg by mouth 2 (two) times daily with a meal. 04/17/20   [provider]  clopidogrel (PLAVIX) 75 MG tablet Take 1 tablet (75 mg total) by mouth daily. 11/02/22   Rhyne, Ames Coupe, PA-C  diazepam (VALIUM) 5 MG tablet Take 5 mg by mouth 2 (two) times daily as needed for muscle spasms. 12/09/21   [provider]  Fluticasone-Umeclidin-Vilant (TRELEGY ELLIPTA IN) Inhale 1 puff into the lungs daily.    [provider]  insulin lispro (HUMALOG KWIKPEN) 100 UNIT/ML KwikPen Inject 0-9 Units into the skin 3 (three) times daily with meals. CBG < 70: treat low blood sugar CBG 70 - 120: 0 units  CBG 121 -  150: 1 unit  CBG 151 - 200: 2 units  CBG 201 - 250: 3 units  CBG 251 - 300: 5 units  CBG 301 - 350: 7 units  CBG 351 - 400: 9 units  CBG > 400: call MD 09/27/23   Zigmund Daniel., MD  insulin regular human CONCENTRATED (HUMULIN R U-500 KWIKPEN) 500 UNIT/ML KwikPen Inject 55 Units into the skin 2 (two) times daily with a meal. Follow up with your PCP and [facility] for additional adjustments 09/27/23 10/27/23  Zigmund Daniel., MD  iron polysaccharides (NIFEREX) 150 MG capsule Take 1 capsule (150 mg total) by mouth daily. 09/26/23   Meredeth Ide, MD  nortriptyline (PAMELOR) 10 MG capsule Take 20 mg by mouth at bedtime.    [provider]  pantoprazole (PROTONIX) 40 MG tablet Take 40 mg by mouth daily before breakfast. 02/09/21   [provider]  pregabalin (LYRICA) 75 MG capsule Take 1 capsule (75 mg total) by mouth See admin instructions. Daily.  Give after dialysis on dialysis days. Patient taking differently: Take 75 mg by mouth daily. 01/21/23   [name], MD  sertraline (ZOLOFT) 100 MG tablet Take 100 mg by mouth in the morning. 02/22/21   [provider]  tamsulosin (FLOMAX) 0.4 MG CAPS capsule Take 1 capsule (0.4 mg total) by mouth daily. 11/10/21   Pahwani,  Daleen Bo, MD  torsemide (DEMADEX) 20 MG tablet Take 120 mg by mouth 2 (two) times daily.    [provider]  traZODone (DESYREL) 150 MG tablet Take 150 mg by mouth at bedtime. 12/09/21   [provider]  Vitamin D, Ergocalciferol, (DRISDOL) 1.25 MG (50000 UNIT) CAPS capsule Take 1 capsule (50,000 Units total) by mouth every 7 (seven) days. 10/02/23   Meredeth Ide, MD      Allergies    Actos [pioglitazone], Dexmedetomidine, Ibuprofen, Tomato, and Wellbutrin [bupropion]    Review of Systems   Review of Systems  Musculoskeletal:  Positive for neck pain.    Physical Exam Updated Vital Signs BP (!) 153/113   Pulse 87   Temp 98.7 F (37.1 C)   Resp 20   Ht 5\' 5"  (1.651 m)    SpO2 98%   BMI 49.20 kg/m  Physical Exam Vitals and nursing note reviewed.  Constitutional:      General: He is not in acute distress.    Appearance: He is well-developed. He is not ill-appearing, toxic-appearing or diaphoretic.  HENT:     Head: Normocephalic and atraumatic.     Right Ear: External ear normal.     Left Ear: External ear normal.  Eyes:     Conjunctiva/sclera: Conjunctivae normal.  Neck:     Comments: Patient has pain to the left-sided paraspinal muscles of neck attributed to "tweaking" neck while getting dressed this morning Cardiovascular:     Rate and Rhythm: Normal rate and regular rhythm.     Pulses: Normal pulses.     Heart sounds: Normal heart sounds. No murmur heard. Pulmonary:     Effort: Pulmonary effort is normal. No respiratory distress.     Breath sounds: Normal breath sounds.     Comments: Patient on 3 L nasal cannula at baseline Abdominal:     Palpations: Abdomen is soft.     Tenderness: There is no abdominal tenderness.  Musculoskeletal:        General: No swelling.     Cervical back: Neck supple.     Comments: LLE AKA RLE edema LUE AV fistula with palpable thrill  Skin:    General: Skin is warm and dry.     Capillary Refill: Capillary refill takes less than 2 seconds.  Neurological:     Mental Status: He is alert.  Psychiatric:        Mood and Affect: Mood normal.     ED Results / Procedures / Treatments   Labs (all labs ordered are listed, but only abnormal results are displayed) Labs Reviewed  RENAL FUNCTION PANEL  CBC    EKG None  Radiology No results found.  Procedures Procedures    Medications Ordered in ED Medications  Chlorhexidine Gluconate Cloth 2 % PADS 6 each (has no administration in time range)    ED Course/ Medical Decision Making/ A&P                                 Medical Decision Making Risk Prescription drug management.   This patient presents to the ED for concern of dialysis differential  diagnosis includes dialysis    Additional history obtained:  External records from outside source obtained and reviewed including Care Everywhere   Lab Tests:  I Ordered, and personally interpreted labs.  The pertinent results include: Patient refused labs in ED, would like to get labs while in dialysis  Medicines ordered and prescription drug management:  I ordered medication including Percocet for acute on chronic neck pain Reevaluation of the patient after these medicines showed that the patient improved I have reviewed the patients home medicines and have made adjustments as needed   Problem List / ED Course:  ESRD Consulted nephrology, Dr.Shertz  Patient signed out to Brittini Henderly, PA-C pending return from dialysis and reassessment.          Final Clinical Impression(s) / ED Diagnoses Final diagnoses:  None    Rx / DC Orders ED Discharge Orders     None         Gretta Began 02/17/24 1518    Vanetta Mulders, MD 02/19/24 2011

## 2024-02-17 NOTE — ED Triage Notes (Signed)
 Pt presents for routine dialysis.  C/o chronic neck pain.  Pain score 10/10.    Pt reports he starts PT for neck next week.

## 2024-02-17 NOTE — Progress Notes (Addendum)
 Received patient I ED stretcher bed.Awake,alert and oriented x 4. He signed his own consent for his hd  treatment.  Access used : Left arm avg that worked well.  Duration of treatment 3 hours.  Net uf goal: Met 2.4 liters  Hemo comment: Quit on his last 30 minutes of treatment due to his home transportation issue.                             Around 1315 ,patient arrived to [facility] ,in his electric scooter, followed by his transported with an empty stretcher bed.Patient insisted of getting himself in bed,which is high risk of potential of falling,staffs told him not to do that,he needs to be in stretcher bed on coming up here. There were several instances before that tried to transfer from electric scooter to bed,but he almost fell due to his left AKA and heavy set body habitus.He has had a verbal argument to the HD CN.this saying,I want to stay on my chair,you don"t know how long I have lie down in bed while waiting to be up here. HD CN call his nurse and ask me to talk to him.I told Mr. Files that its not our policy to have him on his scooter ,and he is non-compliance with staffs instruction,at this moment patient rolled himself out from [facility]  verbalizing with load voice on his frustration for not granting what he wanted to .After  30 minutes he came back in stretcher bed and has had a grumpy face while getting hd treatment.  Hand off to ED nurses for home discharged.

## 2024-02-17 NOTE — ED Provider Notes (Signed)
 Patient seen by previous provider.  He is well-known to the emergency department as well as myself.  He came today for dialysis.  Nursing asked me to reassess patient after he returned from dialysis however he was seen eloping in his wheelchair from the emergency department.  I did not personally assess patient   [name], PA-C 02/17/24 1715    Glyn Ade, MD 02/17/24 2001

## 2024-02-17 NOTE — ED Notes (Signed)
 Pt is requesting lab work be completed while in dialysis.

## 2024-02-17 NOTE — Procedures (Addendum)
 Asked to see patient for hospital dialysis. Patient is not being admitted. The plan will be for "ED HD". Pt will go to the dialysis unit upstairs when HD is ready, and when dialysis is completed pt will be sent back to ED for reassessment.      Last OP HD orders (dec 2023):  3:15h  129kg  RUE AVG   Heparin 2000       Clinical issues (other than dialysis): 1) Anemia of esrd:  ** Pt dose not have an outpatient nephrologist or HD unit so does not have access to ESA and other needed medications.  ** pt had darbe in spring 2024 May, but Hb continued to drop, so dosing of darbepoeitin was increased up to 100 and then 150 mcg q 1-2 wks.  Also pt required two 1 gm IV Fe loading programs during the summer and early fall to get the Hb to respond up to Hb 8-10. In Sept / Oct 2024, Hb dropped down back into the 8's and so dose was ^'d to 200 mcg (every 1-2 wks). In Feb 2025 Hb is in the low 9s.  ** darbepoeitin SQ needs to ordered as "once"  or "once in dialysis", therwise the order will default to "SQ at 1800" but that timing is not good because he typically needs to be ready to leave at 5 pm.    Plan: Last dose of darbepoetin 200 mcg was 3/08. Due again today which is 2 wks later. Will order darbe SQ 200 mcg to be given in ED x 1.   I was present at the procedure, reviewed the HD regimen and made appropriate changes.   Vinson Moselle MD  [facility] 02/17/2024, 3:49 PM

## 2024-02-20 ENCOUNTER — Encounter (HOSPITAL_COMMUNITY): Payer: Self-pay

## 2024-02-20 ENCOUNTER — Emergency Department (HOSPITAL_COMMUNITY)
Admission: EM | Admit: 2024-02-20 | Discharge: 2024-02-20 | Disposition: A | Discharge status: Home / Self Care | Attending: Emergency Medicine | Admitting: Emergency Medicine

## 2024-02-20 ENCOUNTER — Other Ambulatory Visit: Payer: Self-pay

## 2024-02-20 DIAGNOSIS — Z992 Dependence on renal dialysis: Secondary | ICD-10-CM | POA: Diagnosis not present

## 2024-02-20 DIAGNOSIS — I132 Hypertensive heart and chronic kidney disease with heart failure and with stage 5 chronic kidney disease, or end stage renal disease: Secondary | ICD-10-CM | POA: Insufficient documentation

## 2024-02-20 DIAGNOSIS — M542 Cervicalgia: Secondary | ICD-10-CM | POA: Insufficient documentation

## 2024-02-20 DIAGNOSIS — N186 End stage renal disease: Secondary | ICD-10-CM | POA: Insufficient documentation

## 2024-02-20 DIAGNOSIS — I509 Heart failure, unspecified: Secondary | ICD-10-CM | POA: Insufficient documentation

## 2024-02-20 DIAGNOSIS — Z794 Long term (current) use of insulin: Secondary | ICD-10-CM | POA: Insufficient documentation

## 2024-02-20 DIAGNOSIS — E1122 Type 2 diabetes mellitus with diabetic chronic kidney disease: Secondary | ICD-10-CM | POA: Diagnosis not present

## 2024-02-20 DIAGNOSIS — J449 Chronic obstructive pulmonary disease, unspecified: Secondary | ICD-10-CM | POA: Diagnosis not present

## 2024-02-20 DIAGNOSIS — Z7982 Long term (current) use of aspirin: Secondary | ICD-10-CM | POA: Diagnosis not present

## 2024-02-20 LAB — CBC
HCT: 34.4 % — ABNORMAL LOW (ref 39.0–52.0)
HCT: 31 % — ABNORMAL LOW (ref 39.0–52.0)
Hemoglobin: 10.4 g/dL — ABNORMAL LOW (ref 13.0–17.0)
Hemoglobin: 9.8 g/dL — ABNORMAL LOW (ref 13.0–17.0)
MCH: 28.7 pg (ref 26.0–34.0)
MCH: 28.7 pg (ref 26.0–34.0)
MCHC: 30.2 g/dL (ref 30.0–36.0)
MCHC: 31.6 g/dL (ref 30.0–36.0)
MCV: 95 fL (ref 80.0–100.0)
MCV: 90.6 fL (ref 80.0–100.0)
Platelets: 122 10*3/uL — ABNORMAL LOW (ref 150–400)
Platelets: 140 10*3/uL — ABNORMAL LOW (ref 150–400)
RBC: 3.62 MIL/uL — ABNORMAL LOW (ref 4.22–5.81)
RBC: 3.42 MIL/uL — ABNORMAL LOW (ref 4.22–5.81)
RDW: 15.5 % (ref 11.5–15.5)
RDW: 15.6 % — ABNORMAL HIGH (ref 11.5–15.5)
WBC: 7.7 10*3/uL (ref 4.0–10.5)
WBC: 8.6 10*3/uL (ref 4.0–10.5)
nRBC: 0 % (ref 0.0–0.2)
nRBC: 0 % (ref 0.0–0.2)

## 2024-02-20 LAB — RENAL FUNCTION PANEL
Albumin: 3.6 g/dL (ref 3.5–5.0)
Anion gap: 15 (ref 5–15)
BUN: 85 mg/dL — ABNORMAL HIGH (ref 8–23)
CO2: 23 mmol/L (ref 22–32)
Calcium: 7.7 mg/dL — ABNORMAL LOW (ref 8.9–10.3)
Chloride: 99 mmol/L (ref 98–111)
Creatinine, Ser: 6.47 mg/dL — ABNORMAL HIGH (ref 0.61–1.24)
GFR, Estimated: 9 mL/min — ABNORMAL LOW (ref >60)
Glucose, Bld: 75 mg/dL (ref 70–99)
Phosphorus: 5.9 mg/dL — ABNORMAL HIGH (ref 2.5–4.6)
Potassium: 4 mmol/L (ref 3.5–5.1)
Sodium: 137 mmol/L (ref 135–145)

## 2024-02-20 MED ORDER — HYDROCODONE-ACETAMINOPHEN 5-325 MG PO TABS
1.0000 | ORAL_TABLET | Freq: Once | ORAL | Status: AC
  Administered 2024-02-20: 1 via ORAL
  Filled 2024-02-20: qty 1

## 2024-02-20 MED ORDER — ANTICOAGULANT SODIUM CITRATE 4% (200MG/5ML) IV SOLN
5.0000 mL | Status: DC | PRN
  Filled 2024-02-20: qty 5

## 2024-02-20 MED ORDER — CHLORHEXIDINE GLUCONATE CLOTH 2 % EX PADS: 6.0000 | MEDICATED_PAD | Freq: Every day | CUTANEOUS | Status: DC

## 2024-02-20 MED ORDER — HEPARIN SODIUM (PORCINE) 1000 UNIT/ML DIALYSIS: 20.0000 [IU]/kg | INTRAMUSCULAR | Status: DC | PRN

## 2024-02-20 MED ORDER — PENTAFLUOROPROP-TETRAFLUOROETH EX AERO: 1.0000 | INHALATION_SPRAY | CUTANEOUS | Status: DC | PRN

## 2024-02-20 NOTE — Procedures (Signed)
 I was present at this dialysis session. I have reviewed the session itself and made appropriate changes.  [name] has been accepted at [facility] starting in early April.  Vital signs in last 24 hours:  Temp:  [97.7 F (36.5 C)-98.2 F (36.8 C)] 98 F (36.7 C) (03/25 1330) Pulse Rate:  [73-85] 81 (03/25 1500) Resp:  [12-23] 12 (03/25 1500) BP: (94-179)/(57-88) 171/71 (03/25 1500) SpO2:  [94 %-100 %] 100 % (03/25 1500) Weight:  [134.1 kg] 134.1 kg (03/25 1301) Weight change:  Filed Weights   02/20/24 1301  Weight: 134.1 kg    Recent Labs  Lab 02/20/24 1310  NA 137  K 4.0  CL 99  CO2 23  GLUCOSE 75  BUN 85*  CREATININE 6.47*  CALCIUM 7.7*  PHOS 5.9*    Recent Labs  Lab 02/17/24 1230 02/20/24 0933 02/20/24 1310  WBC 7.5 7.7 8.6  HGB 10.1* 10.4* 9.8*  HCT 32.1* 34.4* 31.0*  MCV 91.5 95.0 90.6  PLT 141* 122* 140*    Scheduled Meds:  Chlorhexidine Gluconate Cloth  6 each Topical Q0600   Continuous Infusions:  anticoagulant sodium citrate     PRN Meds:.anticoagulant sodium citrate, heparin, pentafluoroprop-tetrafluoroeth   Jeffrey Cords,  MD 02/20/2024, 3:34 PM

## 2024-02-20 NOTE — ED Notes (Signed)
 Pt is refusing blood draws. He stated "when y'all stick me you go fishin'." Will call hemodialysis to draw off pts port

## 2024-02-20 NOTE — ED Notes (Signed)
 Informed consent signed w/ Mr. Viney and scan in

## 2024-02-20 NOTE — ED Provider Notes (Signed)
 [facility] [name] EMERGENCY DEPARTMENT AT [facility] Provider Note   CSN: [id number] Arrival date & time: 02/20/24  8413     History No chief complaint on file.   Jeffrey [name] is a 63 y.o. male with history of ESRD on dialysis Tuesday Thursday Saturday, CHF, COPD, diabetes, GERD, hypertension.  Patient presents to ED for dialysis.  The patient comes to the ED for his dialysis.  He is complaining of his chronic neck pain besides this denies any complaints.  Denies chest pain or shortness of breath.  He rested chronically on oxygen, has not increased this recently.  Denies any concerns.  HPI     Home Medications Prior to Admission medications   Medication Sig Start Date End Date Taking? Authorizing Provider  acetaminophen (TYLENOL) 325 MG tablet Take 2 tablets (650 mg total) by mouth every 6 (six) hours. 07/23/23   Lockie Mola, MD  amLODipine (NORVASC) 10 MG tablet Take 10 mg by mouth every morning. 04/17/20   [provider]  ASPIRIN LOW DOSE 81 MG EC tablet Take 81 mg by mouth in the morning. 02/22/21   [provider]  atorvastatin (LIPITOR) 40 MG tablet Take 40 mg by mouth in the morning. 02/09/21   [provider]  calcium acetate (PHOSLO) 667 MG capsule Take 2 capsules (1,334 mg total) by mouth with breakfast, with lunch, and with evening meal. 12/21/23   Ejigiri, Megan Mans, PA-C  carvedilol (COREG) 12.5 MG tablet Take 12.5 mg by mouth 2 (two) times daily with a meal. 04/17/20   [provider]  clopidogrel (PLAVIX) 75 MG tablet Take 1 tablet (75 mg total) by mouth daily. 11/02/22   Rhyne, Ames Coupe, PA-C  diazepam (VALIUM) 5 MG tablet Take 5 mg by mouth 2 (two) times daily as needed for muscle spasms. 12/09/21   [provider]  Fluticasone-Umeclidin-Vilant (TRELEGY ELLIPTA IN) Inhale 1 puff into the lungs daily.    [provider]  insulin lispro (HUMALOG KWIKPEN) 100 UNIT/ML KwikPen Inject 0-9 Units into the skin 3 (three)  times daily with meals. CBG < 70: treat low blood sugar CBG 70 - 120: 0 units  CBG 121 - 150: 1 unit  CBG 151 - 200: 2 units  CBG 201 - 250: 3 units  CBG 251 - 300: 5 units  CBG 301 - 350: 7 units  CBG 351 - 400: 9 units  CBG > 400: call MD 09/27/23   Zigmund Daniel., MD  insulin regular human CONCENTRATED (HUMULIN R U-500 KWIKPEN) 500 UNIT/ML KwikPen Inject 55 Units into the skin 2 (two) times daily with a meal. Follow up with your PCP and [facility] for additional adjustments 09/27/23 10/27/23  Zigmund Daniel., MD  iron polysaccharides (NIFEREX) 150 MG capsule Take 1 capsule (150 mg total) by mouth daily. 09/26/23   Meredeth Ide, MD  nortriptyline (PAMELOR) 10 MG capsule Take 20 mg by mouth at bedtime.    [provider]  pantoprazole (PROTONIX) 40 MG tablet Take 40 mg by mouth daily before breakfast. 02/09/21   [provider]  pregabalin (LYRICA) 75 MG capsule Take 1 capsule (75 mg total) by mouth See admin instructions. Daily.  Give after dialysis on dialysis days. Patient taking differently: Take 75 mg by mouth daily. 01/21/23   [name], MD  sertraline (ZOLOFT) 100 MG tablet Take 100 mg by mouth in the morning. 02/22/21   [provider]  tamsulosin (FLOMAX) 0.4 MG CAPS capsule  Take 1 capsule (0.4 mg total) by mouth daily. 11/10/21   [name], MD  torsemide (DEMADEX) 20 MG tablet Take 120 mg by mouth 2 (two) times daily.    [provider]  traZODone (DESYREL) 150 MG tablet Take 150 mg by mouth at bedtime. 12/09/21   [provider]  Vitamin D, Ergocalciferol, (DRISDOL) 1.25 MG (50000 UNIT) CAPS capsule Take 1 capsule (50,000 Units total) by mouth every 7 (seven) days. 10/02/23   Meredeth Ide, MD      Allergies    Actos [pioglitazone], Dexmedetomidine, Ibuprofen, Tomato, and Wellbutrin [bupropion]    Review of Systems   Review of Systems  Musculoskeletal:  Positive for neck pain.    Physical Exam Updated Vital  Signs BP (!) 179/88   Pulse 85   Temp 98.2 F (36.8 C)   Resp 17   SpO2 100%  Physical Exam Vitals and nursing note reviewed.  Constitutional:      General: He is not in acute distress.    Appearance: He is well-developed.  HENT:     Head: Normocephalic and atraumatic.  Eyes:     Conjunctiva/sclera: Conjunctivae normal.  Neck:     Comments: Nonfocal cervical spinal tenderness.  Full range of motion of cervical spine. Cardiovascular:     Rate and Rhythm: Normal rate and regular rhythm.     Heart sounds: No murmur heard. Pulmonary:     Effort: Pulmonary effort is normal. No respiratory distress.     Breath sounds: Normal breath sounds.  Abdominal:     Palpations: Abdomen is soft.     Tenderness: There is no abdominal tenderness.  Musculoskeletal:        General: No swelling.     Cervical back: Neck supple.     Comments: Left-sided AKA  Skin:    General: Skin is warm and dry.     Capillary Refill: Capillary refill takes less than 2 seconds.  Neurological:     Mental Status: He is alert.  Psychiatric:        Mood and Affect: Mood normal.     ED Results / Procedures / Treatments   Labs (all labs ordered are listed, but only abnormal results are displayed) Labs Reviewed  CBC - Abnormal; Notable for the following components:      Result Value   RBC 3.62 (*)    Hemoglobin 10.4 (*)    HCT 34.4 (*)    Platelets 122 (*)    All other components within normal limits  RENAL FUNCTION PANEL    EKG None  Radiology No results found.  Procedures Procedures   Medications Ordered in ED Medications  Chlorhexidine Gluconate Cloth 2 % PADS 6 each (has no administration in time range)  HYDROcodone-acetaminophen (NORCO/VICODIN) 5-325 MG per tablet 1 tablet (1 tablet Oral Given 02/20/24 0747)    ED Course/ Medical Decision Making/ A&P  Medical Decision Making Amount and/or Complexity of Data Reviewed Labs: ordered.  Risk Prescription drug management.   63 year old  male, well-known to the department, returns for dialysis.  Last dialysis session was on Saturday.  Patient has no complaints.  Complaining of chronic neck pain, unchanged in nature.  Provided with 5 mg hydrocodone.  CBC, renal function panel collected.  Spoke with Dr. Arrie Aran, nephrology, who has stated that patient will be dialyzed today.  No new concerns at this time.   Final Clinical Impression(s) / ED Diagnoses Final diagnoses:  ESRD (end stage renal disease) on dialysis [facility])  Dialysis  patient [facility])    Rx / DC Orders ED Discharge Orders     None         Clent Ridges 02/20/24 1127    Lorre Nick, MD 02/21/24 1023

## 2024-02-20 NOTE — Progress Notes (Signed)
 Received patient in E.D stretcher bed.Alert and oriented x 4.He signed his hd tx consent.  Access used :Left arm AVG that worked well.  Duration of treatment: 3 hours.  Net uf: 1.2 L  Hemo comment: He quit on his last 50 minutes of treatment due to generalized cramping.  Hand off to the ED nurse for discharge home.

## 2024-02-20 NOTE — ED Notes (Signed)
Phlebotomy bedside attempting to get blood. 

## 2024-02-20 NOTE — ED Notes (Signed)
 Pt transported to dialysis

## 2024-02-20 NOTE — ED Triage Notes (Signed)
 Pt here for dialysis, last session was Saturday. Pt also c.o chronic neck pain. Pt has no other complaints

## 2024-02-24 ENCOUNTER — Emergency Department (HOSPITAL_COMMUNITY)
Admission: EM | Admit: 2024-02-24 | Discharge: 2024-02-24 | Disposition: A | Discharge status: Home / Self Care | Attending: Emergency Medicine | Admitting: Emergency Medicine

## 2024-02-24 ENCOUNTER — Other Ambulatory Visit: Payer: Self-pay

## 2024-02-24 ENCOUNTER — Encounter (HOSPITAL_COMMUNITY): Payer: Self-pay

## 2024-02-24 DIAGNOSIS — Z992 Dependence on renal dialysis: Secondary | ICD-10-CM | POA: Insufficient documentation

## 2024-02-24 DIAGNOSIS — N186 End stage renal disease: Secondary | ICD-10-CM | POA: Diagnosis not present

## 2024-02-24 DIAGNOSIS — E1122 Type 2 diabetes mellitus with diabetic chronic kidney disease: Secondary | ICD-10-CM | POA: Diagnosis not present

## 2024-02-24 DIAGNOSIS — M542 Cervicalgia: Secondary | ICD-10-CM | POA: Diagnosis present

## 2024-02-24 DIAGNOSIS — N189 Chronic kidney disease, unspecified: Secondary | ICD-10-CM | POA: Insufficient documentation

## 2024-02-24 DIAGNOSIS — J449 Chronic obstructive pulmonary disease, unspecified: Secondary | ICD-10-CM | POA: Diagnosis not present

## 2024-02-24 DIAGNOSIS — Z794 Long term (current) use of insulin: Secondary | ICD-10-CM | POA: Diagnosis not present

## 2024-02-24 DIAGNOSIS — G8929 Other chronic pain: Secondary | ICD-10-CM

## 2024-02-24 DIAGNOSIS — I509 Heart failure, unspecified: Secondary | ICD-10-CM | POA: Diagnosis not present

## 2024-02-24 DIAGNOSIS — Z7982 Long term (current) use of aspirin: Secondary | ICD-10-CM | POA: Diagnosis not present

## 2024-02-24 LAB — RENAL FUNCTION PANEL
Albumin: 3.7 g/dL (ref 3.5–5.0)
Anion gap: 16 — ABNORMAL HIGH (ref 5–15)
BUN: 86 mg/dL — ABNORMAL HIGH (ref 8–23)
CO2: 24 mmol/L (ref 22–32)
Calcium: 7.4 mg/dL — ABNORMAL LOW (ref 8.9–10.3)
Chloride: 98 mmol/L (ref 98–111)
Creatinine, Ser: 6.46 mg/dL — ABNORMAL HIGH (ref 0.61–1.24)
GFR, Estimated: 9 mL/min — ABNORMAL LOW (ref >60)
Glucose, Bld: 131 mg/dL — ABNORMAL HIGH (ref 70–99)
Phosphorus: 6.1 mg/dL — ABNORMAL HIGH (ref 2.5–4.6)
Potassium: 3.7 mmol/L (ref 3.5–5.1)
Sodium: 138 mmol/L (ref 135–145)

## 2024-02-24 LAB — CBC
HCT: 29.4 % — ABNORMAL LOW (ref 39.0–52.0)
Hemoglobin: 9.4 g/dL — ABNORMAL LOW (ref 13.0–17.0)
MCH: 28.8 pg (ref 26.0–34.0)
MCHC: 32 g/dL (ref 30.0–36.0)
MCV: 90.2 fL (ref 80.0–100.0)
Platelets: 132 10*3/uL — ABNORMAL LOW (ref 150–400)
RBC: 3.26 MIL/uL — ABNORMAL LOW (ref 4.22–5.81)
RDW: 15.1 % (ref 11.5–15.5)
WBC: 7.2 10*3/uL (ref 4.0–10.5)
nRBC: 0 % (ref 0.0–0.2)

## 2024-02-24 MED ORDER — PENTAFLUOROPROP-TETRAFLUOROETH EX AERO
1.0000 | INHALATION_SPRAY | CUTANEOUS | Status: DC | PRN
  Filled 2024-02-24: qty 116

## 2024-02-24 MED ORDER — HEPARIN SODIUM (PORCINE) 1000 UNIT/ML DIALYSIS: 1000.0000 [IU] | INTRAMUSCULAR | Status: DC | PRN

## 2024-02-24 MED ORDER — ANTICOAGULANT SODIUM CITRATE 4% (200MG/5ML) IV SOLN
5.0000 mL | Status: DC | PRN
  Filled 2024-02-24: qty 5

## 2024-02-24 MED ORDER — CHLORHEXIDINE GLUCONATE CLOTH 2 % EX PADS: 6.0000 | MEDICATED_PAD | Freq: Every day | CUTANEOUS | Status: DC

## 2024-02-24 MED ORDER — ALTEPLASE 2 MG IJ SOLR: 2.0000 mg | Freq: Once | INTRAMUSCULAR | Status: DC | PRN

## 2024-02-24 MED ORDER — HYDROMORPHONE HCL 1 MG/ML IJ SOLN
1.0000 mg | Freq: Once | INTRAMUSCULAR | Status: AC
  Administered 2024-02-24: 1 mg via INTRAMUSCULAR
  Filled 2024-02-24: qty 1

## 2024-02-24 MED ORDER — LIDOCAINE HCL (PF) 1 % IJ SOLN: 5.0000 mL | INTRAMUSCULAR | Status: DC | PRN

## 2024-02-24 MED ORDER — HEPARIN SODIUM (PORCINE) 1000 UNIT/ML DIALYSIS
20.0000 [IU]/kg | INTRAMUSCULAR | Status: DC | PRN
  Administered 2024-02-24: 2700 [IU] via INTRAVENOUS_CENTRAL
  Filled 2024-02-24 (×2): qty 3

## 2024-02-24 MED ORDER — LIDOCAINE-PRILOCAINE 2.5-2.5 % EX CREA
1.0000 | TOPICAL_CREAM | CUTANEOUS | Status: DC | PRN
  Filled 2024-02-24: qty 5

## 2024-02-24 NOTE — Procedures (Signed)
 Patient reported that he will need to sign off treatment early in order to get a ride. Dr. Ronalee Belts notified. No new orders received.

## 2024-02-24 NOTE — Progress Notes (Signed)
 C/o hand cramp, UF placed on pause. 100 ml NS bolus given.

## 2024-02-24 NOTE — ED Provider Notes (Signed)
 [place] EMERGENCY DEPARTMENT AT [facility] Provider Note   CSN: [id number] Arrival date & time: 02/24/24  0730     History  Chief Complaint  Patient presents with   Dialysis   Neck Pain    EINO Jeffrey [name] is a 63 y.o. male.  The history is provided by the patient and medical records. No language interpreter was used.  Neck Pain Pain location:  Generalized neck Quality:  Aching Pain radiates to:  Does not radiate Pain severity:  Unable to specify Pain is:  Unable to specify Onset quality:  Unable to specify Timing:  Constant Progression:  Waxing and waning Chronicity:  Chronic Relieved by:  Nothing Worsened by:  Nothing Ineffective treatments:  None tried Associated symptoms: no fever        Home Medications Prior to Admission medications   Medication Sig Start Date End Date Taking? Authorizing Provider  acetaminophen (TYLENOL) 325 MG tablet Take 2 tablets (650 mg total) by mouth every 6 (six) hours. 07/23/23   Lockie Mola, MD  amLODipine (NORVASC) 10 MG tablet Take 10 mg by mouth every morning. 04/17/20   [provider]  ASPIRIN LOW DOSE 81 MG EC tablet Take 81 mg by mouth in the morning. 02/22/21   [provider]  atorvastatin (LIPITOR) 40 MG tablet Take 40 mg by mouth in the morning. 02/09/21   [provider]  calcium acetate (PHOSLO) 667 MG capsule Take 2 capsules (1,334 mg total) by mouth with breakfast, with lunch, and with evening meal. 12/21/23   Ejigiri, Megan Mans, PA-C  carvedilol (COREG) 12.5 MG tablet Take 12.5 mg by mouth 2 (two) times daily with a meal. 04/17/20   [provider]  clopidogrel (PLAVIX) 75 MG tablet Take 1 tablet (75 mg total) by mouth daily. 11/02/22   Rhyne, Ames Coupe, PA-C  diazepam (VALIUM) 5 MG tablet Take 5 mg by mouth 2 (two) times daily as needed for muscle spasms. 12/09/21   [provider]  Fluticasone-Umeclidin-Vilant (TRELEGY ELLIPTA IN) Inhale 1 puff into the lungs  daily.    [provider]  insulin lispro (HUMALOG KWIKPEN) 100 UNIT/ML KwikPen Inject 0-9 Units into the skin 3 (three) times daily with meals. CBG < 70: treat low blood sugar CBG 70 - 120: 0 units  CBG 121 - 150: 1 unit  CBG 151 - 200: 2 units  CBG 201 - 250: 3 units  CBG 251 - 300: 5 units  CBG 301 - 350: 7 units  CBG 351 - 400: 9 units  CBG > 400: call MD 09/27/23   Zigmund Daniel., MD  insulin regular human CONCENTRATED (HUMULIN R U-500 KWIKPEN) 500 UNIT/ML KwikPen Inject 55 Units into the skin 2 (two) times daily with a meal. Follow up with your PCP and [facility] for additional adjustments 09/27/23 10/27/23  Zigmund Daniel., MD  iron polysaccharides (NIFEREX) 150 MG capsule Take 1 capsule (150 mg total) by mouth daily. 09/26/23   Meredeth Ide, MD  nortriptyline (PAMELOR) 10 MG capsule Take 20 mg by mouth at bedtime.    [provider]  pantoprazole (PROTONIX) 40 MG tablet Take 40 mg by mouth daily before breakfast. 02/09/21   [provider]  pregabalin (LYRICA) 75 MG capsule Take 1 capsule (75 mg total) by mouth See admin instructions. Daily.  Give after dialysis on dialysis days. Patient taking differently: Take 75 mg by mouth daily. 01/21/23   [name], MD  sertraline (ZOLOFT) 100  MG tablet Take 100 mg by mouth in the morning. 02/22/21   [provider]  tamsulosin (FLOMAX) 0.4 MG CAPS capsule Take 1 capsule (0.4 mg total) by mouth daily. 11/10/21   [name], MD  torsemide (DEMADEX) 20 MG tablet Take 120 mg by mouth 2 (two) times daily.    [provider]  traZODone (DESYREL) 150 MG tablet Take 150 mg by mouth at bedtime. 12/09/21   [provider]  Vitamin D, Ergocalciferol, (DRISDOL) 1.25 MG (50000 UNIT) CAPS capsule Take 1 capsule (50,000 Units total) by mouth every 7 (seven) days. 10/02/23   Meredeth Ide, MD      Allergies    Actos [pioglitazone], Dexmedetomidine, Ibuprofen, Tomato, and Wellbutrin  [bupropion]    Review of Systems   Review of Systems  Constitutional:  Negative for chills, fatigue and fever.  HENT:  Negative for congestion.   Respiratory:  Negative for cough and chest tightness.   Gastrointestinal:  Negative for nausea and vomiting.  Musculoskeletal:  Positive for neck pain. Negative for back pain.    Physical Exam Updated Vital Signs BP (!) 159/64 (BP Location: Right Arm)   Pulse 88   Temp 98 F (36.7 C)   Resp 20   SpO2 96%  Physical Exam Vitals and nursing note reviewed.  Constitutional:      General: He is not in acute distress.    Appearance: He is well-developed. He is not ill-appearing.  HENT:     Head: Normocephalic and atraumatic.     Nose: Nose normal.     Mouth/Throat:     Mouth: Mucous membranes are moist.  Eyes:     Conjunctiva/sclera: Conjunctivae normal.  Cardiovascular:     Rate and Rhythm: Normal rate and regular rhythm.  Pulmonary:     Effort: Pulmonary effort is normal. No respiratory distress.     Breath sounds: Normal breath sounds. No wheezing, rhonchi or rales.  Chest:     Chest wall: No tenderness.  Abdominal:     Palpations: Abdomen is soft.     Tenderness: There is no abdominal tenderness.  Musculoskeletal:        General: No swelling.     Cervical back: Neck supple.  Skin:    General: Skin is warm and dry.     Capillary Refill: Capillary refill takes less than 2 seconds.  Neurological:     Mental Status: He is alert. Mental status is at baseline.  Psychiatric:        Mood and Affect: Mood normal.     ED Results / Procedures / Treatments   Labs (all labs ordered are listed, but only abnormal results are displayed) Labs Reviewed  RENAL FUNCTION PANEL - Abnormal; Notable for the following components:      Result Value   Glucose, Bld 131 (*)    BUN 86 (*)    Creatinine, Ser 6.46 (*)    Calcium 7.4 (*)    Phosphorus 6.1 (*)    GFR, Estimated 9 (*)    Anion gap 16 (*)    All other components within normal  limits  CBC - Abnormal; Notable for the following components:   RBC 3.26 (*)    Hemoglobin 9.4 (*)    HCT 29.4 (*)    Platelets 132 (*)    All other components within normal limits  RENAL FUNCTION PANEL  CBC  HEPATITIS B SURFACE ANTIGEN  HEPATITIS B SURFACE ANTIBODY, QUANTITATIVE    EKG None  Radiology No  results found.  Procedures Procedures    Medications Ordered in ED Medications  Chlorhexidine Gluconate Cloth 2 % PADS 6 each (has no administration in time range)  HYDROmorphone (DILAUDID) injection 1 mg (1 mg Intramuscular Given 02/24/24 0831)    ED Course/ Medical Decision Making/ A&P                                 Medical Decision Making Amount and/or Complexity of Data Reviewed Labs: ordered.  Risk Prescription drug management.    HAGER COMPSTON is a 63 y.o. male with a past medical history significant for ESRD, COPD on home oxygen, diabetes, CHF, and chronic neck pain who presents with chronic neck pain and needing dialysis.  Patient comes to dialysis at this emergency department multiple times a week and is here for his dialysis today.  He reports he still has his severe neck pain.  No other trauma reported.  He otherwise denies other symptoms.  Patient is resting otherwise.  On exam, lungs were clear.  Chest nontender.  Patient moving extremities.  Patient appears in no distress.  Vital signs reveal he is not febrile, tachycardic or tachypneic.  He is on home oxygen.  Will get his screening labs for dialysis and give him a IM shot of pain medicine as he typically gets.  Nephrology was called and are aware of him to get dialysis.  Anticipate dialysis and then discharge after.          Final Clinical Impression(s) / ED Diagnoses Final diagnoses:  ESRD (end stage renal disease) on dialysis [facility])  Chronic neck pain    Clinical Impression: 1. ESRD (end stage renal disease) on dialysis (H[facility])   2. Chronic neck pain     Disposition: To dialysis  and then likely discharge after completion if he is feeling well.  This note was prepared with assistance of Conservation officer, historic buildings. Occasional wrong-word or sound-a-like substitutions may have occurred due to the inherent limitations of voice recognition software.      Reve Crocket, Canary Brim, MD 02/24/24 [phone]

## 2024-02-24 NOTE — Procedures (Signed)
 Received patient in bed to unit.  Alert and oriented.  Informed consent signed and in chart.   TX duration: 1 hour 47 min  Patient tolerated well.  Transported back to the room  Alert, without acute distress.  Hand-off given to patient's nurse.   Access used: left graft Access issues: none  Total UF removed: 1.3 liters Medication(s) given: heparin  Lu Duffel, RN [facility]

## 2024-02-24 NOTE — Progress Notes (Addendum)
 Patient requested to end treatment early due to cramp and it almost being time to go. Educated patient on the importance of staying for treatment. Verbalized understanding yet still wanted to end treatment for today. MD already aware. AMA signed.

## 2024-02-24 NOTE — ED Triage Notes (Signed)
 Pt here for dialysis, last session was Tuesday. Pt has no other complaints besides his chronic neck pain.

## 2024-02-24 NOTE — ED Notes (Signed)
 Pt requested that his blood be drawn at dialysis

## 2024-02-27 ENCOUNTER — Emergency Department (HOSPITAL_COMMUNITY)
Admission: EM | Admit: 2024-02-27 | Discharge: 2024-02-27 | Disposition: A | Discharge status: Home / Self Care | Attending: Emergency Medicine | Admitting: Emergency Medicine

## 2024-02-27 ENCOUNTER — Other Ambulatory Visit: Payer: Self-pay

## 2024-02-27 DIAGNOSIS — Z89612 Acquired absence of left leg above knee: Secondary | ICD-10-CM | POA: Diagnosis not present

## 2024-02-27 DIAGNOSIS — I509 Heart failure, unspecified: Secondary | ICD-10-CM | POA: Insufficient documentation

## 2024-02-27 DIAGNOSIS — Z7902 Long term (current) use of antithrombotics/antiplatelets: Secondary | ICD-10-CM | POA: Insufficient documentation

## 2024-02-27 DIAGNOSIS — M542 Cervicalgia: Secondary | ICD-10-CM | POA: Diagnosis present

## 2024-02-27 DIAGNOSIS — Z992 Dependence on renal dialysis: Secondary | ICD-10-CM | POA: Insufficient documentation

## 2024-02-27 DIAGNOSIS — Z7982 Long term (current) use of aspirin: Secondary | ICD-10-CM | POA: Diagnosis not present

## 2024-02-27 DIAGNOSIS — Z794 Long term (current) use of insulin: Secondary | ICD-10-CM | POA: Diagnosis not present

## 2024-02-27 DIAGNOSIS — N186 End stage renal disease: Secondary | ICD-10-CM | POA: Diagnosis not present

## 2024-02-27 DIAGNOSIS — J449 Chronic obstructive pulmonary disease, unspecified: Secondary | ICD-10-CM | POA: Diagnosis not present

## 2024-02-27 MED ORDER — HYDROMORPHONE HCL 1 MG/ML IJ SOLN
1.0000 mg | Freq: Once | INTRAMUSCULAR | Status: AC
  Administered 2024-02-27: 1 mg via INTRAMUSCULAR
  Filled 2024-02-27: qty 1

## 2024-02-27 MED ORDER — CHLORHEXIDINE GLUCONATE CLOTH 2 % EX PADS
6.0000 | MEDICATED_PAD | Freq: Every day | CUTANEOUS | Status: DC
Start: 2024-02-27 — End: 2024-02-27

## 2024-02-27 MED ORDER — DARBEPOETIN ALFA 200 MCG/0.4ML IJ SOSY: 200.0000 ug | PREFILLED_SYRINGE | Freq: Once | INTRAMUSCULAR | Status: DC

## 2024-02-27 NOTE — ED Triage Notes (Signed)
 Pt. Here for dialysis , Almost completed full treatment on Saturday for dialysis. Still having neck pain

## 2024-02-27 NOTE — ED Provider Notes (Signed)
 [place] EMERGENCY DEPARTMENT AT [facility] Provider Note   CSN: [id number] Arrival date & time: 02/27/24  0750     History  Chief Complaint  Patient presents with   needs dialysis    Jeffrey [name] is a 63 y.o. male with a history of ESRD on hemodialysis, left above-the-knee amputation, COPD,, and CHF presents the ED today for dialysis.  Patient comes to the ED frequently for dialysis, he is scheduled to get it Tuesdays, Thursdays, and Saturdays.  Additionally, he reports continued chronic diffuse neck pain.  He states that he is now going to physical therapy and his neck is sore all over.  He is requesting something for pain prior to dialysis.  Denies any new or worsening pain.  No additional complaints or concerns at this time.    Home Medications Prior to Admission medications   Medication Sig Start Date End Date Taking? Authorizing Provider  acetaminophen (TYLENOL) 325 MG tablet Take 2 tablets (650 mg total) by mouth every 6 (six) hours. 07/23/23   Lockie Mola, MD  amLODipine (NORVASC) 10 MG tablet Take 10 mg by mouth every morning. 04/17/20   [provider]  ASPIRIN LOW DOSE 81 MG EC tablet Take 81 mg by mouth in the morning. 02/22/21   [provider]  atorvastatin (LIPITOR) 40 MG tablet Take 40 mg by mouth in the morning. 02/09/21   [provider]  calcium acetate (PHOSLO) 667 MG capsule Take 2 capsules (1,334 mg total) by mouth with breakfast, with lunch, and with evening meal. 12/21/23   Ejigiri, Megan Mans, PA-C  carvedilol (COREG) 12.5 MG tablet Take 12.5 mg by mouth 2 (two) times daily with a meal. 04/17/20   [provider]  clopidogrel (PLAVIX) 75 MG tablet Take 1 tablet (75 mg total) by mouth daily. 11/02/22   Rhyne, Ames Coupe, PA-C  diazepam (VALIUM) 5 MG tablet Take 5 mg by mouth 2 (two) times daily as needed for muscle spasms. 12/09/21   [provider]  Fluticasone-Umeclidin-Vilant (TRELEGY ELLIPTA IN) Inhale 1  puff into the lungs daily.    [provider]  insulin lispro (HUMALOG KWIKPEN) 100 UNIT/ML KwikPen Inject 0-9 Units into the skin 3 (three) times daily with meals. CBG < 70: treat low blood sugar CBG 70 - 120: 0 units  CBG 121 - 150: 1 unit  CBG 151 - 200: 2 units  CBG 201 - 250: 3 units  CBG 251 - 300: 5 units  CBG 301 - 350: 7 units  CBG 351 - 400: 9 units  CBG > 400: call MD 09/27/23   Zigmund Daniel., MD  insulin regular human CONCENTRATED (HUMULIN R U-500 KWIKPEN) 500 UNIT/ML KwikPen Inject 55 Units into the skin 2 (two) times daily with a meal. Follow up with your PCP and [facility] for additional adjustments 09/27/23 10/27/23  Zigmund Daniel., MD  iron polysaccharides (NIFEREX) 150 MG capsule Take 1 capsule (150 mg total) by mouth daily. 09/26/23   Meredeth Ide, MD  nortriptyline (PAMELOR) 10 MG capsule Take 20 mg by mouth at bedtime.    [provider]  pantoprazole (PROTONIX) 40 MG tablet Take 40 mg by mouth daily before breakfast. 02/09/21   [provider]  pregabalin (LYRICA) 75 MG capsule Take 1 capsule (75 mg total) by mouth See admin instructions. Daily.  Give after dialysis on dialysis days. Patient taking differently: Take 75 mg by mouth daily. 01/21/23   [name], MD  sertraline (ZOLOFT) 100 MG tablet Take 100 mg by mouth in the morning. 02/22/21   [provider]  tamsulosin (FLOMAX) 0.4 MG CAPS capsule Take 1 capsule (0.4 mg total) by mouth daily. 11/10/21   [name], MD  torsemide (DEMADEX) 20 MG tablet Take 120 mg by mouth 2 (two) times daily.    [provider]  traZODone (DESYREL) 150 MG tablet Take 150 mg by mouth at bedtime. 12/09/21   [provider]  Vitamin D, Ergocalciferol, (DRISDOL) 1.25 MG (50000 UNIT) CAPS capsule Take 1 capsule (50,000 Units total) by mouth every 7 (seven) days. 10/02/23   Meredeth Ide, MD      Allergies    Actos [pioglitazone], Dexmedetomidine, Ibuprofen, Tomato,  and Wellbutrin [bupropion]    Review of Systems   Review of Systems  Musculoskeletal:  Positive for neck pain.  All other systems reviewed and are negative.   Physical Exam Updated Vital Signs BP (!) 158/75   Pulse 83   Temp 97.9 F (36.6 C)   Resp (!) 23   SpO2 100%  Physical Exam Vitals and nursing note reviewed.  Constitutional:      General: He is not in acute distress.    Appearance: Normal appearance.  HENT:     Head: Normocephalic and atraumatic.     Mouth/Throat:     Mouth: Mucous membranes are moist.  Eyes:     Conjunctiva/sclera: Conjunctivae normal.     Pupils: Pupils are equal, round, and reactive to light.  Cardiovascular:     Rate and Rhythm: Normal rate and regular rhythm.     Pulses: Normal pulses.  Pulmonary:     Effort: Pulmonary effort is normal.     Breath sounds: Normal breath sounds.  Abdominal:     Palpations: Abdomen is soft.     Tenderness: There is no abdominal tenderness.  Skin:    General: Skin is warm and dry.     Findings: No rash.  Neurological:     General: No focal deficit present.     Mental Status: He is alert.  Psychiatric:        Mood and Affect: Mood normal.        Behavior: Behavior normal.    ED Results / Procedures / Treatments   Labs (all labs ordered are listed, but only abnormal results are displayed) Labs Reviewed - No data to display   EKG None  Radiology No results found.  Procedures Procedures: not indicated.   Medications Ordered in ED Medications  Chlorhexidine Gluconate Cloth 2 % PADS 6 each (has no administration in time range)  Darbepoetin Alfa (ARANESP) injection 200 mcg (has no administration in time range)  HYDROmorphone (DILAUDID) injection 1 mg (1 mg Intramuscular Given 02/27/24 0851)    ED Course/ Medical Decision Making/ A&P                                 Medical Decision Making Amount and/or Complexity of Data Reviewed Labs: ordered.  Risk Prescription drug management.   This  patient presents to the ED for concern of hemodialysis and neck pain, this involves an extensive number of treatment options, and is a complaint that carries with it a high risk of complications and morbidity.   Differential diagnosis includes: acute vs chronic neck pain, etc.   Comorbidities  See HPI above   Additional History  Additional history obtained from prior ED visits.  Consultations  I requested consultation with Dr. Arlean Hopping  with nephrology,  and discussed lab and imaging findings as well as pertinent plan - they recommend: hospital dialysis. He will be sent back to ED after for reassessment prior to discharge home.   Problem List / ED Course / Critical Interventions / Medication Management  Patient reports diffuse chronic neck pain.  He states he recently started going to physical therapy which has been helping some.  Denies any new or worsening pain since his last ED visit.  States that the pain medication that he was given last time helped and is requesting more at this time.  Additionally, he reports that he is here for dialysis. Last dialysis treatment was  2 days ago. He was discharged from outpatient dialysis and comes to the ED for it Tuesdays, Thursdays, and Saturdays.  No additional complaints or concerns at this time. I ordered medications including: Dilaudid for neck pain  Reevaluation of the patient after these medications patients symptoms improved. I have reviewed the patients home medicines and have made adjustments as needed   Social Determinants of Health  Physical activity   Test / Admission - Considered  Patient is stable and safe for dialysis.        Final Clinical Impression(s) / ED Diagnoses Final diagnoses:  Encounter for hemodialysis [facility])  Neck pain    Rx / DC Orders ED Discharge Orders     None         Maxwell Marion, PA-C 02/27/24 1459    Alvira Monday, MD 02/28/24 1300

## 2024-02-27 NOTE — Progress Notes (Signed)
 Received patient in ED stretcher bed.Alert and oriented x 4. He signed his hd treatment consent.  Access used : Left AVG that worked well.  Duration of treatment : 2 hours.  Net UF : 1.5 L  Medicine given : Heparin 2,000 units.  Hemo comment : He quit on his last hour of treatment due to his transportation issue. He signed an AMA form.  Hand off to the patient's nurse.

## 2024-02-27 NOTE — ED Provider Notes (Signed)
 5:23 PM Patient returned from dialysis and was feeling well by report.  Patient left the emergency department for outpatient management and follow-up.  Patient will be discharged for further outpatient management.  Clinical Impression: 1. Encounter for hemodialysis (HCC)   2. Neck pain     Disposition: Discharge  Condition: Good  I have discussed the results, Dx and Tx plan with the pt(& family if present). He/she/they expressed understanding and agree(s) with the plan. Discharge instructions discussed at great length. Strict return precautions discussed and pt &/or family have verbalized understanding of the instructions. No further questions at time of discharge.    New Prescriptions   No medications on file    Follow Up: No follow-up provider specified.    Elija Mccamish, Canary Brim, MD 02/27/24 [phone]

## 2024-02-27 NOTE — Procedures (Signed)
 Asked to see this patient for hospital dialysis. Patient was released from his outpatient HD unit for behavior issues. The plan will be for "ED HD". Pt is not to be admitted. Pt will go to the dialysis unit upstairs when they are ready for the patient. When dialysis is completed pt will be sent back to ED for reassessment.   Last OP HD orders (dec 2023):  3:15h  129kg  RUE AVG   Heparin 2000       Clinical issues: 1) Anemia of esrd:  ** Pt dose not have an outpatient nephrologist or HD unit so does not have access to ESA and other needed medications.  ** pt had darbe in spring 2024 May, but Hb continued to drop, so dosing of darbepoeitin was increased up to 100 and then 150 mcg q 1-2 wks.  Pt required two 1 gm IV Fe loading programs during the summer 2024 and early fall to get the Hb to respond up to Hb 8-10. In Sept / Oct 2024, Hb dropped down back into the 8's and so dose was ^'d to 200 mcg (every 1-2 wks). In March 2025 Hb is in the 9- 10 range.  ** darbepoeitin SQ needs to ordered as "once"  or "once in dialysis", therwise the order will default to "SQ at 1800" but that timing is not good because he typically needs to be ready to leave at 5 pm.    Plan: Last dose of darbepoetin 200 mcg was 3/08. Will order darbe SQ 200 mcg to be given while here if possible.     I was present at the procedure, reviewed the HD regimen and made appropriate changes.   Vinson Moselle MD  [facility] 02/27/2024, 1:32 PM

## 2024-02-29 ENCOUNTER — Other Ambulatory Visit: Payer: Self-pay

## 2024-02-29 ENCOUNTER — Emergency Department (HOSPITAL_COMMUNITY)
Admission: EM | Admit: 2024-02-29 | Discharge: 2024-02-29 | Disposition: A | Discharge status: Home / Self Care | Attending: Emergency Medicine | Admitting: Emergency Medicine

## 2024-02-29 ENCOUNTER — Encounter (HOSPITAL_COMMUNITY): Payer: Self-pay

## 2024-02-29 DIAGNOSIS — E1122 Type 2 diabetes mellitus with diabetic chronic kidney disease: Secondary | ICD-10-CM | POA: Diagnosis not present

## 2024-02-29 DIAGNOSIS — D631 Anemia in chronic kidney disease: Secondary | ICD-10-CM | POA: Diagnosis not present

## 2024-02-29 DIAGNOSIS — J449 Chronic obstructive pulmonary disease, unspecified: Secondary | ICD-10-CM | POA: Insufficient documentation

## 2024-02-29 DIAGNOSIS — I132 Hypertensive heart and chronic kidney disease with heart failure and with stage 5 chronic kidney disease, or end stage renal disease: Secondary | ICD-10-CM | POA: Insufficient documentation

## 2024-02-29 DIAGNOSIS — I509 Heart failure, unspecified: Secondary | ICD-10-CM | POA: Insufficient documentation

## 2024-02-29 DIAGNOSIS — Z79899 Other long term (current) drug therapy: Secondary | ICD-10-CM | POA: Insufficient documentation

## 2024-02-29 DIAGNOSIS — Z794 Long term (current) use of insulin: Secondary | ICD-10-CM | POA: Insufficient documentation

## 2024-02-29 DIAGNOSIS — Z7982 Long term (current) use of aspirin: Secondary | ICD-10-CM | POA: Insufficient documentation

## 2024-02-29 DIAGNOSIS — N186 End stage renal disease: Secondary | ICD-10-CM | POA: Insufficient documentation

## 2024-02-29 DIAGNOSIS — Z7951 Long term (current) use of inhaled steroids: Secondary | ICD-10-CM | POA: Diagnosis not present

## 2024-02-29 DIAGNOSIS — Z992 Dependence on renal dialysis: Secondary | ICD-10-CM | POA: Insufficient documentation

## 2024-02-29 LAB — CBC
HCT: 26.4 % — ABNORMAL LOW (ref 39.0–52.0)
Hemoglobin: 8.2 g/dL — ABNORMAL LOW (ref 13.0–17.0)
MCH: 28.7 pg (ref 26.0–34.0)
MCHC: 31.1 g/dL (ref 30.0–36.0)
MCV: 92.3 fL (ref 80.0–100.0)
Platelets: 103 10*3/uL — ABNORMAL LOW (ref 150–400)
RBC: 2.86 MIL/uL — ABNORMAL LOW (ref 4.22–5.81)
RDW: 14.9 % (ref 11.5–15.5)
WBC: 7.7 10*3/uL (ref 4.0–10.5)
nRBC: 0 % (ref 0.0–0.2)

## 2024-02-29 LAB — RENAL FUNCTION PANEL
Albumin: 3.5 g/dL (ref 3.5–5.0)
Anion gap: 17 — ABNORMAL HIGH (ref 5–15)
BUN: 91 mg/dL — ABNORMAL HIGH (ref 8–23)
CO2: 23 mmol/L (ref 22–32)
Calcium: 7.4 mg/dL — ABNORMAL LOW (ref 8.9–10.3)
Chloride: 98 mmol/L (ref 98–111)
Creatinine, Ser: 6.82 mg/dL — ABNORMAL HIGH (ref 0.61–1.24)
GFR, Estimated: 8 mL/min — ABNORMAL LOW (ref >60)
Glucose, Bld: 238 mg/dL — ABNORMAL HIGH (ref 70–99)
Phosphorus: 5.5 mg/dL — ABNORMAL HIGH (ref 2.5–4.6)
Potassium: 3.9 mmol/L (ref 3.5–5.1)
Sodium: 138 mmol/L (ref 135–145)

## 2024-02-29 MED ORDER — HEPARIN SODIUM (PORCINE) 1000 UNIT/ML DIALYSIS: 1000.0000 [IU] | INTRAMUSCULAR | Status: DC | PRN

## 2024-02-29 MED ORDER — ANTICOAGULANT SODIUM CITRATE 4% (200MG/5ML) IV SOLN: 5.0000 mL | Status: DC | PRN

## 2024-02-29 MED ORDER — CHLORHEXIDINE GLUCONATE CLOTH 2 % EX PADS: 6.0000 | MEDICATED_PAD | Freq: Every day | CUTANEOUS | Status: DC

## 2024-02-29 MED ORDER — ALTEPLASE 2 MG IJ SOLR: 2.0000 mg | Freq: Once | INTRAMUSCULAR | Status: DC | PRN

## 2024-02-29 MED ORDER — LIDOCAINE HCL (PF) 1 % IJ SOLN: 5.0000 mL | INTRAMUSCULAR | Status: DC | PRN

## 2024-02-29 MED ORDER — HEPARIN SODIUM (PORCINE) 1000 UNIT/ML DIALYSIS
2000.0000 [IU] | Freq: Once | INTRAMUSCULAR | Status: AC
  Administered 2024-02-29: 2000 [IU] via INTRAVENOUS_CENTRAL
  Filled 2024-02-29: qty 2

## 2024-02-29 MED ORDER — NEPRO/CARBSTEADY PO LIQD: 237.0000 mL | ORAL | Status: DC | PRN

## 2024-02-29 MED ORDER — PENTAFLUOROPROP-TETRAFLUOROETH EX AERO: 1.0000 | INHALATION_SPRAY | CUTANEOUS | Status: DC | PRN

## 2024-02-29 MED ORDER — LIDOCAINE-PRILOCAINE 2.5-2.5 % EX CREA: 1.0000 | TOPICAL_CREAM | CUTANEOUS | Status: DC | PRN

## 2024-02-29 MED ORDER — DARBEPOETIN ALFA 200 MCG/0.4ML IJ SOSY
200.0000 ug | PREFILLED_SYRINGE | Freq: Once | INTRAMUSCULAR | Status: AC
  Administered 2024-02-29: 200 ug via SUBCUTANEOUS
  Filled 2024-02-29: qty 0.4

## 2024-02-29 NOTE — Progress Notes (Signed)
 Patient arrived from the ED. Patient is alert and oriented x 4. No verbalized concerns. Patient transfers via motorized mobility chair. Access is positive for thrill and bruit. Stable for hemodialysis

## 2024-02-29 NOTE — ED Provider Notes (Signed)
 Blood pressure (!) 167/88, pulse 78, temperature 98.2 F (36.8 C), resp. rate 20, SpO2 100%.   In short, Jeffrey [name] is a 63 y.o. male with a chief complaint of Vascular Access Problem .  Refer to the original H&P for additional details.  Patient has completed his routine HD. Feeling well. Stable for discharge.     Maia Plan, MD 02/29/24 1356

## 2024-02-29 NOTE — ED Provider Notes (Signed)
 [facility]ERGENCY DEPARTMENT AT [facility] Provider Note   CSN: [id number] Arrival date & time: 02/29/24  0720     History  Chief Complaint  Patient presents with   Vascular Access Problem    Jeffrey [name] is a 63 y.o. male.  HPI Patient presents needing dialysis.  Well-known to the department as this is his 58th visit in the last 6 months.  No longer has outpatient dialysis.  Last treatment on Tuesday with today being Thursday.  Reportedly only did 2 hours of dialysis then.  States his neck hurts also.  History of chronic pain.  States he feels as if he has been urinating a little more than normal but no dysuria.   Past Medical History:  Diagnosis Date   Altered mental status 05/04/2021   Arthritis    Blood transfusion without reported diagnosis    CHF (congestive heart failure) (HCC)    COPD (chronic obstructive pulmonary disease) (HCC)    Depression    Diabetes mellitus without complication (HCC)    type 2   Dyspnea    ESRD on hemodialysis (HCC)    Tues Thurs Sat   GERD (gastroesophageal reflux disease)    protonix   Heart murmur    as a child, no problems   Hypertension    Myocardial infarction (HCC)    PTSD (post-traumatic stress disorder)    Sepsis (HCC)    Sleep apnea    occasional uses CPAP   Uses powered wheelchair     Home Medications Prior to Admission medications   Medication Sig Start Date End Date Taking? Authorizing Provider  acetaminophen (TYLENOL) 325 MG tablet Take 2 tablets (650 mg total) by mouth every 6 (six) hours. 07/23/23   Lockie Mola, MD  amLODipine (NORVASC) 10 MG tablet Take 10 mg by mouth every morning. 04/17/20   [provider]  ASPIRIN LOW DOSE 81 MG EC tablet Take 81 mg by mouth in the morning. 02/22/21   [provider]  atorvastatin (LIPITOR) 40 MG tablet Take 40 mg by mouth in the morning. 02/09/21   [provider]  calcium acetate (PHOSLO) 667 MG capsule Take 2 capsules (1,334 mg total)  by mouth with breakfast, with lunch, and with evening meal. 12/21/23   Ejigiri, Megan Mans, PA-C  carvedilol (COREG) 12.5 MG tablet Take 12.5 mg by mouth 2 (two) times daily with a meal. 04/17/20   [provider]  clopidogrel (PLAVIX) 75 MG tablet Take 1 tablet (75 mg total) by mouth daily. 11/02/22   Rhyne, Ames Coupe, PA-C  diazepam (VALIUM) 5 MG tablet Take 5 mg by mouth 2 (two) times daily as needed for muscle spasms. 12/09/21   [provider]  Fluticasone-Umeclidin-Vilant (TRELEGY ELLIPTA IN) Inhale 1 puff into the lungs daily.    [provider]  insulin lispro (HUMALOG KWIKPEN) 100 UNIT/ML KwikPen Inject 0-9 Units into the skin 3 (three) times daily with meals. CBG < 70: treat low blood sugar CBG 70 - 120: 0 units  CBG 121 - 150: 1 unit  CBG 151 - 200: 2 units  CBG 201 - 250: 3 units  CBG 251 - 300: 5 units  CBG 301 - 350: 7 units  CBG 351 - 400: 9 units  CBG > 400: call MD 09/27/23   Zigmund Daniel., MD  insulin regular human CONCENTRATED (HUMULIN R U-500 KWIKPEN) 500 UNIT/ML KwikPen Inject 55 Units into the skin 2 (two) times daily with a meal. Follow  up with your PCP and [facility] for additional adjustments 09/27/23 10/27/23  Zigmund Daniel., MD  iron polysaccharides (NIFEREX) 150 MG capsule Take 1 capsule (150 mg total) by mouth daily. 09/26/23   Meredeth Ide, MD  nortriptyline (PAMELOR) 10 MG capsule Take 20 mg by mouth at bedtime.    [provider]  pantoprazole (PROTONIX) 40 MG tablet Take 40 mg by mouth daily before breakfast. 02/09/21   [provider]  pregabalin (LYRICA) 75 MG capsule Take 1 capsule (75 mg total) by mouth See admin instructions. Daily.  Give after dialysis on dialysis days. Patient taking differently: Take 75 mg by mouth daily. 01/21/23   [name], MD  sertraline (ZOLOFT) 100 MG tablet Take 100 mg by mouth in the morning. 02/22/21   [provider]  tamsulosin (FLOMAX) 0.4 MG CAPS capsule  Take 1 capsule (0.4 mg total) by mouth daily. 11/10/21   [name], MD  torsemide (DEMADEX) 20 MG tablet Take 120 mg by mouth 2 (two) times daily.    [provider]  traZODone (DESYREL) 150 MG tablet Take 150 mg by mouth at bedtime. 12/09/21   [provider]  Vitamin D, Ergocalciferol, (DRISDOL) 1.25 MG (50000 UNIT) CAPS capsule Take 1 capsule (50,000 Units total) by mouth every 7 (seven) days. 10/02/23   Meredeth Ide, MD      Allergies    Actos [pioglitazone], Dexmedetomidine, Ibuprofen, Tomato, and Wellbutrin [bupropion]    Review of Systems   Review of Systems  Physical Exam Updated Vital Signs BP (!) 167/88   Pulse 78   Temp 98.2 F (36.8 C)   Resp 20   SpO2 100%  Physical Exam Vitals and nursing note reviewed.  Cardiovascular:     Rate and Rhythm: Normal rate.  Pulmonary:     Breath sounds: No wheezing or rhonchi.  Neurological:     Mental Status: He is alert. Mental status is at baseline.     ED Results / Procedures / Treatments   Labs (all labs ordered are listed, but only abnormal results are displayed) Labs Reviewed  CBC - Abnormal; Notable for the following components:      Result Value   RBC 2.86 (*)    Hemoglobin 8.2 (*)    HCT 26.4 (*)    Platelets 103 (*)    All other components within normal limits  RENAL FUNCTION PANEL - Abnormal; Notable for the following components:   Glucose, Bld 238 (*)    BUN 91 (*)    Creatinine, Ser 6.82 (*)    Calcium 7.4 (*)    Phosphorus 5.5 (*)    GFR, Estimated 8 (*)    Anion gap 17 (*)    All other components within normal limits    EKG None  Radiology No results found.  Procedures Procedures    Medications Ordered in ED Medications  Chlorhexidine Gluconate Cloth 2 % PADS 6 each (0 each Topical Hold 02/29/24 0822)  heparin injection 2,000 Units (2,000 Units Dialysis Given 02/29/24 0900)  Darbepoetin Alfa (ARANESP) injection 200 mcg (200 mcg Subcutaneous Given 02/29/24 1223)    ED  Course/ Medical Decision Making/ A&P                                 Medical Decision Making  Patient with history of end-stage renal disease.  Dialyzed through ER due to issues with outpatient dialysis.  Will  discuss with nephrology.  Discussed with Dr. Chales Abrahams and will be taken to dialysis.  Patient return to ER and not willing to wait for paper.          Final Clinical Impression(s) / ED Diagnoses Final diagnoses:  ESRD (end stage renal disease) on dialysis [facility])    Rx / DC Orders ED Discharge Orders     None         Benjiman Core, MD 02/29/24 [phone]

## 2024-02-29 NOTE — ED Notes (Signed)
 Consent signed and sent w/ Pt.

## 2024-02-29 NOTE — ED Notes (Signed)
 Did not wait on dc papers.  Vitals stable from dialysis.  Alert and oriented on baseline 3L [place]

## 2024-02-29 NOTE — ED Triage Notes (Signed)
 Pt. Stated, last treat on Tuesday completed 2 hours of dialysis  due to ride home. My neck still hurting.

## 2024-02-29 NOTE — Procedures (Signed)
 Asked to see this patient for hospital dialysis. Patient was released from his outpatient HD unit for behavior issues. The plan will be for "ED HD". Pt is not to be admitted. Pt will go to the dialysis unit upstairs when they are ready for the patient. When dialysis is completed pt will be sent back to ED for reassessment.    Last OP HD orders (dec 2023):  3:15h  129kg  RUE AVG   Heparin 2000       Clinical issues: 1) Anemia of esrd:  ** Pt dose not have an outpatient nephrologist or HD unit so does not have access to ESA and other needed medications.  ** pt had darbe in spring 2024 May, but Hb continued to drop, so dosing of darbepoeitin was increased up to 100 and then 150 mcg q 1-2 wks.  Pt required two 1 gm IV Fe loading programs during the summer 2024 and early fall to get the Hb to respond up to Hb 8-10. In Sept / Oct 2024, Hb dropped down back into the 8's and so dose was ^'d to 200 mcg (every 1-2 wks). In March 2025 Hb is in the 9- 10 range.  ** darbepoeitin SQ needs to ordered as "once"  or "once in dialysis", therwise the order will default to "SQ at 1800" but that timing is not good because he typically needs to be ready to leave at 5 pm.    Plan: Last dose of darbepoetin 200 mcg was 3/08. Will order darbe SQ 200 mcg to be given in HD today.    I was present at the procedure, reviewed the HD regimen and made appropriate changes.   Vinson Moselle MD  [facility] 02/29/2024, 11:01 AM

## 2024-03-02 ENCOUNTER — Encounter (HOSPITAL_COMMUNITY): Payer: Self-pay | Admitting: Emergency Medicine

## 2024-03-02 ENCOUNTER — Emergency Department (HOSPITAL_COMMUNITY)
Admission: EM | Admit: 2024-03-02 | Discharge: 2024-03-02 | Disposition: A | Discharge status: Home / Self Care | Attending: Emergency Medicine | Admitting: Emergency Medicine

## 2024-03-02 DIAGNOSIS — N186 End stage renal disease: Secondary | ICD-10-CM | POA: Insufficient documentation

## 2024-03-02 DIAGNOSIS — Z992 Dependence on renal dialysis: Secondary | ICD-10-CM | POA: Diagnosis present

## 2024-03-02 DIAGNOSIS — G8929 Other chronic pain: Secondary | ICD-10-CM | POA: Diagnosis not present

## 2024-03-02 DIAGNOSIS — Z794 Long term (current) use of insulin: Secondary | ICD-10-CM | POA: Diagnosis not present

## 2024-03-02 DIAGNOSIS — Z7982 Long term (current) use of aspirin: Secondary | ICD-10-CM | POA: Insufficient documentation

## 2024-03-02 LAB — CBC
HCT: 27.2 % — ABNORMAL LOW (ref 39.0–52.0)
Hemoglobin: 8.5 g/dL — ABNORMAL LOW (ref 13.0–17.0)
MCH: 29.1 pg (ref 26.0–34.0)
MCHC: 31.3 g/dL (ref 30.0–36.0)
MCV: 93.2 fL (ref 80.0–100.0)
Platelets: 113 10*3/uL — ABNORMAL LOW (ref 150–400)
RBC: 2.92 MIL/uL — ABNORMAL LOW (ref 4.22–5.81)
RDW: 15.1 % (ref 11.5–15.5)
WBC: 8 10*3/uL (ref 4.0–10.5)
nRBC: 0 % (ref 0.0–0.2)

## 2024-03-02 LAB — RENAL FUNCTION PANEL
Albumin: 3.4 g/dL — ABNORMAL LOW (ref 3.5–5.0)
Anion gap: 14 (ref 5–15)
BUN: 89 mg/dL — ABNORMAL HIGH (ref 8–23)
CO2: 25 mmol/L (ref 22–32)
Calcium: 7.9 mg/dL — ABNORMAL LOW (ref 8.9–10.3)
Chloride: 97 mmol/L — ABNORMAL LOW (ref 98–111)
Creatinine, Ser: 6.05 mg/dL — ABNORMAL HIGH (ref 0.61–1.24)
GFR, Estimated: 10 mL/min — ABNORMAL LOW (ref >60)
Glucose, Bld: 338 mg/dL — ABNORMAL HIGH (ref 70–99)
Phosphorus: 5.8 mg/dL — ABNORMAL HIGH (ref 2.5–4.6)
Potassium: 4 mmol/L (ref 3.5–5.1)
Sodium: 136 mmol/L (ref 135–145)

## 2024-03-02 MED ORDER — METHOCARBAMOL 500 MG PO TABS
500.0000 mg | ORAL_TABLET | Freq: Once | ORAL | Status: AC
  Administered 2024-03-02: 500 mg via ORAL
  Filled 2024-03-02: qty 1

## 2024-03-02 MED ORDER — CHLORHEXIDINE GLUCONATE CLOTH 2 % EX PADS: 6.0000 | MEDICATED_PAD | Freq: Every day | CUTANEOUS | Status: DC

## 2024-03-02 MED ORDER — HEPARIN SODIUM (PORCINE) 1000 UNIT/ML DIALYSIS
2000.0000 [IU] | Freq: Once | INTRAMUSCULAR | Status: DC
  Filled 2024-03-02: qty 2

## 2024-03-02 MED ORDER — ACETAMINOPHEN 325 MG PO TABS: 650.0000 mg | ORAL_TABLET | Freq: Once | ORAL | Status: DC

## 2024-03-02 NOTE — ED Triage Notes (Signed)
 Pt here for dialysis. Last tx Thursday. Reports chronic pain.

## 2024-03-02 NOTE — Procedures (Signed)
 I was present at the procedure, reviewed the HD regimen and made appropriate changes.   Vinson Moselle MD  [facility] 03/02/2024, 4:46 PM

## 2024-03-02 NOTE — Progress Notes (Addendum)
 Received patient in bed to unit.  Alert and oriented.  Informed consent signed and in chart.   TX duration: 3.25  Patient tolerated well.  Transported back to the room  Alert, without acute distress.  Hand-off given to patient's nurse.   Access used: Left UA AVF Access issues: None  Total UF removed: 2500 ml Medication(s) given: See [facility]   03/02/24 1245  Vitals  Temp 97.8 F (36.6 C)  Pulse Rate 86  Resp 11  BP (!) 181/76  SpO2 100 %  Oxygen Therapy  Patient Activity (if Appropriate) In bed  Pulse Oximetry Type Continuous  Post Treatment  Dialyzer Clearance Clear  Hemodialysis Intake (mL) 0 mL  Liters Processed 70.5  Fluid Removed (mL) 2500 mL  Tolerated HD Treatment Yes  Post-Hemodialysis Comments Pt tolerated tx well, no complications noted  AVG/AVF Arterial Site Held (minutes) 10 minutes  AVG/AVF Venous Site Held (minutes) 10 minutes

## 2024-03-02 NOTE — ED Provider Notes (Signed)
  EMERGENCY DEPARTMENT AT [facility] Provider Note   CSN: [id number] Arrival date & time: 03/02/24  4696     History  No chief complaint on file.   Jeffrey [name] is a 63 y.o. male who is well-known to this department with ESRD on dialysis, he presents today for his normal dialysis, he has no acute complaints. Has been compliant with treatments. Endorses some chronic pain.   HPI     Home Medications Prior to Admission medications   Medication Sig Start Date End Date Taking? Authorizing Provider  acetaminophen (TYLENOL) 325 MG tablet Take 2 tablets (650 mg total) by mouth every 6 (six) hours. 07/23/23   Lockie Mola, MD  amLODipine (NORVASC) 10 MG tablet Take 10 mg by mouth every morning. 04/17/20   [provider]  ASPIRIN LOW DOSE 81 MG EC tablet Take 81 mg by mouth in the morning. 02/22/21   [provider]  atorvastatin (LIPITOR) 40 MG tablet Take 40 mg by mouth in the morning. 02/09/21   [provider]  calcium acetate (PHOSLO) 667 MG capsule Take 2 capsules (1,334 mg total) by mouth with breakfast, with lunch, and with evening meal. 12/21/23   Ejigiri, Megan Mans, PA-C  carvedilol (COREG) 12.5 MG tablet Take 12.5 mg by mouth 2 (two) times daily with a meal. 04/17/20   [provider]  clopidogrel (PLAVIX) 75 MG tablet Take 1 tablet (75 mg total) by mouth daily. 11/02/22   Rhyne, Ames Coupe, PA-C  diazepam (VALIUM) 5 MG tablet Take 5 mg by mouth 2 (two) times daily as needed for muscle spasms. 12/09/21   [provider]  Fluticasone-Umeclidin-Vilant (TRELEGY ELLIPTA IN) Inhale 1 puff into the lungs daily.    [provider]  insulin lispro (HUMALOG KWIKPEN) 100 UNIT/ML KwikPen Inject 0-9 Units into the skin 3 (three) times daily with meals. CBG < 70: treat low blood sugar CBG 70 - 120: 0 units  CBG 121 - 150: 1 unit  CBG 151 - 200: 2 units  CBG 201 - 250: 3 units  CBG 251 - 300: 5 units  CBG 301 - 350: 7  units  CBG 351 - 400: 9 units  CBG > 400: call MD 09/27/23   Zigmund Daniel., MD  insulin regular human CONCENTRATED (HUMULIN R U-500 KWIKPEN) 500 UNIT/ML KwikPen Inject 55 Units into the skin 2 (two) times daily with a meal. Follow up with your PCP and [facility] for additional adjustments 09/27/23 10/27/23  Zigmund Daniel., MD  iron polysaccharides (NIFEREX) 150 MG capsule Take 1 capsule (150 mg total) by mouth daily. 09/26/23   Meredeth Ide, MD  nortriptyline (PAMELOR) 10 MG capsule Take 20 mg by mouth at bedtime.    [provider]  pantoprazole (PROTONIX) 40 MG tablet Take 40 mg by mouth daily before breakfast. 02/09/21   [provider]  pregabalin (LYRICA) 75 MG capsule Take 1 capsule (75 mg total) by mouth See admin instructions. Daily.  Give after dialysis on dialysis days. Patient taking differently: Take 75 mg by mouth daily. 01/21/23   [name], MD  sertraline (ZOLOFT) 100 MG tablet Take 100 mg by mouth in the morning. 02/22/21   [provider]  tamsulosin (FLOMAX) 0.4 MG CAPS capsule Take 1 capsule (0.4 mg total) by mouth daily. 11/10/21   [name], MD  torsemide (DEMADEX) 20 MG tablet Take 120 mg by mouth 2 (two) times daily.    [provider]  traZODone (DESYREL) 150 MG tablet Take 150 mg by mouth at bedtime. 12/09/21   [provider]  Vitamin D, Ergocalciferol, (DRISDOL) 1.25 MG (50000 UNIT) CAPS capsule Take 1 capsule (50,000 Units total) by mouth every 7 (seven) days. 10/02/23   Meredeth Ide, MD      Allergies    Actos [pioglitazone], Dexmedetomidine, Ibuprofen, Tomato, and Wellbutrin [bupropion]    Review of Systems   Review of Systems  All other systems reviewed and are negative.   Physical Exam Updated Vital Signs BP (!) 158/58   Pulse 94   Temp 98 F (36.7 C) (Oral)   Resp 16   Ht 5\' 5"  (1.651 m)   SpO2 96%   BMI 48.76 kg/m  Physical Exam Vitals and nursing note reviewed.  Constitutional:       General: He is not in acute distress.    Appearance: Normal appearance. He is ill-appearing.     Comments: Chronically ill appearing, non septic, nontoxic appearing  HENT:     Head: Normocephalic and atraumatic.  Eyes:     General:        Right eye: No discharge.        Left eye: No discharge.  Cardiovascular:     Rate and Rhythm: Normal rate and regular rhythm.     Heart sounds: No murmur heard.    No friction rub. No gallop.  Pulmonary:     Effort: Pulmonary effort is normal.     Breath sounds: Normal breath sounds.  Abdominal:     General: Bowel sounds are normal.     Palpations: Abdomen is soft.  Musculoskeletal:     Comments: Left sided AKA  Skin:    General: Skin is warm and dry.     Capillary Refill: Capillary refill takes less than 2 seconds.  Neurological:     Mental Status: He is alert and oriented to person, place, and time.  Psychiatric:        Mood and Affect: Mood normal.        Behavior: Behavior normal.     ED Results / Procedures / Treatments   Labs (all labs ordered are listed, but only abnormal results are displayed) Labs Reviewed  CBC  RENAL FUNCTION PANEL    EKG None  Radiology No results found.  Procedures Procedures    Medications Ordered in ED Medications  Chlorhexidine Gluconate Cloth 2 % PADS 6 each (has no administration in time range)  heparin injection 2,000 Units (has no administration in time range)  methocarbamol (ROBAXIN) tablet 500 mg (500 mg Oral Given 03/02/24 0759)    ED Course/ Medical Decision Making/ A&P                                 Medical Decision Making Amount and/or Complexity of Data Reviewed Labs: ordered.   This is an overall well-appearing 63 year old male who presents with concern for routine dialysis.  He is well-known to this department.  He reports some chronic back pain but otherwise has no acute complaints.  He took Tylenol already prior to arrival.  Denies any nausea, vomiting, abdominal  pain, chest pain, shortness of breath.  Dr. Chales Abrahams aware of patient in department, hemodialysis ordered, will plan for Treatment and discharge from the ED following completed treatment. Final Clinical Impression(s) / ED Diagnoses Final diagnoses:  None    Rx / DC Orders ED Discharge Orders  None         Olene Floss, [place] 03/02/24 9562    Rolan Bucco, MD 03/02/24 [phone]

## 2024-03-04 ENCOUNTER — Ambulatory Visit: Payer: 59 | Attending: Cardiology | Admitting: Cardiology

## 2024-03-04 NOTE — Progress Notes (Signed)
 Late Note Entry- March 04, 2024  Referral submitted to [facility] admissions for review. Local clinic hoping to be able to start pt next week for out-pt HD. Will need to discuss with pt when he comes for next HD treatment. Will update nephrologist as well.   Olivia Canter Renal Navigator [phone]

## 2024-03-05 ENCOUNTER — Emergency Department (HOSPITAL_COMMUNITY)
Admission: EM | Admit: 2024-03-05 | Discharge: 2024-03-05 | Discharge status: Against Medical Advice | Attending: Emergency Medicine | Admitting: Emergency Medicine

## 2024-03-05 ENCOUNTER — Other Ambulatory Visit: Payer: Self-pay

## 2024-03-05 ENCOUNTER — Encounter (HOSPITAL_COMMUNITY): Payer: Self-pay | Admitting: *Deleted

## 2024-03-05 DIAGNOSIS — Z992 Dependence on renal dialysis: Secondary | ICD-10-CM | POA: Insufficient documentation

## 2024-03-05 DIAGNOSIS — G8929 Other chronic pain: Secondary | ICD-10-CM | POA: Insufficient documentation

## 2024-03-05 DIAGNOSIS — Z5321 Procedure and treatment not carried out due to patient leaving prior to being seen by health care provider: Secondary | ICD-10-CM | POA: Insufficient documentation

## 2024-03-05 DIAGNOSIS — T82598A Other mechanical complication of other cardiac and vascular devices and implants, initial encounter: Secondary | ICD-10-CM | POA: Insufficient documentation

## 2024-03-05 LAB — RENAL FUNCTION PANEL
Albumin: 3.4 g/dL — ABNORMAL LOW (ref 3.5–5.0)
Anion gap: 17 — ABNORMAL HIGH (ref 5–15)
BUN: 100 mg/dL — ABNORMAL HIGH (ref 8–23)
CO2: 25 mmol/L (ref 22–32)
Calcium: 7.8 mg/dL — ABNORMAL LOW (ref 8.9–10.3)
Chloride: 95 mmol/L — ABNORMAL LOW (ref 98–111)
Creatinine, Ser: 6.19 mg/dL — ABNORMAL HIGH (ref 0.61–1.24)
GFR, Estimated: 9 mL/min — ABNORMAL LOW (ref >60)
Glucose, Bld: 284 mg/dL — ABNORMAL HIGH (ref 70–99)
Phosphorus: 6.4 mg/dL — ABNORMAL HIGH (ref 2.5–4.6)
Potassium: 3.9 mmol/L (ref 3.5–5.1)
Sodium: 137 mmol/L (ref 135–145)

## 2024-03-05 LAB — CBC
HCT: 26.7 % — ABNORMAL LOW (ref 39.0–52.0)
Hemoglobin: 8.3 g/dL — ABNORMAL LOW (ref 13.0–17.0)
MCH: 29.3 pg (ref 26.0–34.0)
MCHC: 31.1 g/dL (ref 30.0–36.0)
MCV: 94.3 fL (ref 80.0–100.0)
Platelets: 115 10*3/uL — ABNORMAL LOW (ref 150–400)
RBC: 2.83 MIL/uL — ABNORMAL LOW (ref 4.22–5.81)
RDW: 15.4 % (ref 11.5–15.5)
WBC: 9.3 10*3/uL (ref 4.0–10.5)
nRBC: 0.2 % (ref 0.0–0.2)

## 2024-03-05 LAB — HEPATITIS B SURFACE ANTIGEN: Hepatitis B Surface Ag: NONREACTIVE

## 2024-03-05 MED ORDER — LIDOCAINE-PRILOCAINE 2.5-2.5 % EX CREA: 1.0000 | TOPICAL_CREAM | CUTANEOUS | Status: DC | PRN

## 2024-03-05 MED ORDER — HEPARIN SODIUM (PORCINE) 1000 UNIT/ML DIALYSIS
20.0000 [IU]/kg | INTRAMUSCULAR | Status: DC | PRN
  Administered 2024-03-05: 2700 [IU] via INTRAVENOUS_CENTRAL
  Filled 2024-03-05 (×2): qty 3

## 2024-03-05 MED ORDER — HEPARIN SODIUM (PORCINE) 1000 UNIT/ML DIALYSIS: 1000.0000 [IU] | INTRAMUSCULAR | Status: DC | PRN

## 2024-03-05 MED ORDER — LIDOCAINE HCL (PF) 1 % IJ SOLN: 5.0000 mL | INTRAMUSCULAR | Status: DC | PRN

## 2024-03-05 MED ORDER — ANTICOAGULANT SODIUM CITRATE 4% (200MG/5ML) IV SOLN: 5.0000 mL | Status: DC | PRN

## 2024-03-05 MED ORDER — NEPRO/CARBSTEADY PO LIQD: 237.0000 mL | ORAL | Status: DC | PRN

## 2024-03-05 MED ORDER — ALTEPLASE 2 MG IJ SOLR: 2.0000 mg | Freq: Once | INTRAMUSCULAR | Status: DC | PRN

## 2024-03-05 MED ORDER — PENTAFLUOROPROP-TETRAFLUOROETH EX AERO: 1.0000 | INHALATION_SPRAY | CUTANEOUS | Status: DC | PRN

## 2024-03-05 NOTE — Progress Notes (Signed)
 Met with pt at bedside while receiving HD. Discussed with pt that navigator was requested to look at out-pt HD options for pt. Navigator reached out to the renal network and discussed option of pt being considered for [facility] program. Navigator advised that pt could be considered if there is an accepting MD and clinic. Pt's case/situation was then discussed with [facility] staff. Dr Marisue Humble and [facility] are agreeable to pt going to [facility] under a 30 day trial with the Second Chance program (contract) through the renal network. Clinic has a MWF 2nd shift that is available starting next Monday. Pt made aware of the above info. Pt agreeable to 30 day trial through [employer]. Pt spoke to renal network CSW via phone today and her info was provided to pt as well. Pt is requesting a TTS if possible due to other appts on MWF due to pt doing TTS schedule here at hospital. Navigator is checking with [facility] to see if a TTS is a available. Pt advised that navigator will contact him via phone once it is known if clinic has a TTS appt. Update provided to [facility] system wide renal program director as well.   Olivia Canter Renal Navigator [phone]

## 2024-03-05 NOTE — ED Notes (Signed)
 Pt transported to dialysis

## 2024-03-05 NOTE — ED Triage Notes (Signed)
 Pt here for HD, last treatment was Saturday, reports chronic pain.

## 2024-03-05 NOTE — Progress Notes (Signed)
 Received patient in bed to unit.  Alert and oriented.  Informed consent signed and in chart.   TX duration:3.5 hours  Patient tolerated well.  Transported back to the room  Alert, without acute distress.  Hand-off given to patient's nurse.   Access used: Left Upper Arm GRaft Access issues: none  Total UF removed: 3L Medication(s) given: none   03/05/24 1253  Vitals  Temp 97.9 F (36.6 C)  Temp Source Oral  BP (!) 160/78  Pulse Rate 86  ECG Heart Rate 86  Resp 12  Oxygen Therapy  SpO2 100 %  O2 Device Nasal Cannula  O2 Flow Rate (L/min) 3 L/min  During Treatment Monitoring  Duration of HD Treatment -hour(s) 3.5 hour(s)  HD Safety Checks Performed Yes  Intra-Hemodialysis Comments Tx completed  Dialysis Fluid Bolus Normal Saline  Bolus Amount (mL) 300 mL  Post Treatment  Dialyzer Clearance Lightly streaked  Liters Processed 84  Fluid Removed (mL) 3000 mL  Tolerated HD Treatment Yes  AVG/AVF Arterial Site Held (minutes) 7 minutes  AVG/AVF Venous Site Held (minutes) 7 minutes  Fistula / Graft Left Upper arm Arteriovenous vein graft  Placement Date/Time: (c) 04/22/23 (c) 1000   Orientation: Left  Access Location: Upper arm  Access Type: Arteriovenous vein graft  Status Deaccessed     Stacie Glaze LPN [facility]

## 2024-03-08 ENCOUNTER — Emergency Department (HOSPITAL_COMMUNITY)

## 2024-03-08 ENCOUNTER — Emergency Department (HOSPITAL_COMMUNITY)
Admission: EM | Admit: 2024-03-08 | Discharge: 2024-03-09 | Disposition: A | Discharge status: Home / Self Care | Attending: Emergency Medicine | Admitting: Emergency Medicine

## 2024-03-08 ENCOUNTER — Encounter (HOSPITAL_COMMUNITY): Payer: Self-pay

## 2024-03-08 ENCOUNTER — Telehealth: Payer: Self-pay

## 2024-03-08 ENCOUNTER — Other Ambulatory Visit: Payer: Self-pay

## 2024-03-08 DIAGNOSIS — I132 Hypertensive heart and chronic kidney disease with heart failure and with stage 5 chronic kidney disease, or end stage renal disease: Secondary | ICD-10-CM | POA: Insufficient documentation

## 2024-03-08 DIAGNOSIS — Z7982 Long term (current) use of aspirin: Secondary | ICD-10-CM | POA: Diagnosis not present

## 2024-03-08 DIAGNOSIS — N186 End stage renal disease: Secondary | ICD-10-CM | POA: Diagnosis present

## 2024-03-08 DIAGNOSIS — E1122 Type 2 diabetes mellitus with diabetic chronic kidney disease: Secondary | ICD-10-CM | POA: Diagnosis not present

## 2024-03-08 DIAGNOSIS — J449 Chronic obstructive pulmonary disease, unspecified: Secondary | ICD-10-CM | POA: Insufficient documentation

## 2024-03-08 DIAGNOSIS — R0602 Shortness of breath: Secondary | ICD-10-CM | POA: Diagnosis not present

## 2024-03-08 DIAGNOSIS — Z760 Encounter for issue of repeat prescription: Secondary | ICD-10-CM | POA: Insufficient documentation

## 2024-03-08 DIAGNOSIS — Z7951 Long term (current) use of inhaled steroids: Secondary | ICD-10-CM | POA: Insufficient documentation

## 2024-03-08 DIAGNOSIS — I509 Heart failure, unspecified: Secondary | ICD-10-CM | POA: Diagnosis not present

## 2024-03-08 DIAGNOSIS — M542 Cervicalgia: Secondary | ICD-10-CM | POA: Diagnosis not present

## 2024-03-08 DIAGNOSIS — Z992 Dependence on renal dialysis: Secondary | ICD-10-CM | POA: Insufficient documentation

## 2024-03-08 DIAGNOSIS — Z794 Long term (current) use of insulin: Secondary | ICD-10-CM | POA: Insufficient documentation

## 2024-03-08 DIAGNOSIS — Z79899 Other long term (current) drug therapy: Secondary | ICD-10-CM | POA: Insufficient documentation

## 2024-03-08 LAB — CBC
HCT: 24.2 % — ABNORMAL LOW (ref 39.0–52.0)
Hemoglobin: 7.4 g/dL — ABNORMAL LOW (ref 13.0–17.0)
MCH: 28.8 pg (ref 26.0–34.0)
MCHC: 30.6 g/dL (ref 30.0–36.0)
MCV: 94.2 fL (ref 80.0–100.0)
Platelets: 105 10*3/uL — ABNORMAL LOW (ref 150–400)
RBC: 2.57 MIL/uL — ABNORMAL LOW (ref 4.22–5.81)
RDW: 15.9 % — ABNORMAL HIGH (ref 11.5–15.5)
WBC: 7.4 10*3/uL (ref 4.0–10.5)
nRBC: 0.3 % — ABNORMAL HIGH (ref 0.0–0.2)

## 2024-03-08 LAB — BASIC METABOLIC PANEL WITH GFR
Anion gap: 15 (ref 5–15)
BUN: 111 mg/dL — ABNORMAL HIGH (ref 8–23)
CO2: 23 mmol/L (ref 22–32)
Calcium: 7.2 mg/dL — ABNORMAL LOW (ref 8.9–10.3)
Chloride: 95 mmol/L — ABNORMAL LOW (ref 98–111)
Creatinine, Ser: 7.43 mg/dL — ABNORMAL HIGH (ref 0.61–1.24)
GFR, Estimated: 8 mL/min — ABNORMAL LOW (ref >60)
Glucose, Bld: 415 mg/dL — ABNORMAL HIGH (ref 70–99)
Potassium: 4.9 mmol/L (ref 3.5–5.1)
Sodium: 133 mmol/L — ABNORMAL LOW (ref 135–145)

## 2024-03-08 LAB — HEPATITIS B SURFACE ANTIBODY, QUANTITATIVE: Hep B S AB Quant (Post): 29.9 m[IU]/mL

## 2024-03-08 LAB — HEPATITIS B SURFACE ANTIGEN: Hepatitis B Surface Ag: NONREACTIVE

## 2024-03-08 MED ORDER — CHLORHEXIDINE GLUCONATE CLOTH 2 % EX PADS: 6.0000 | MEDICATED_PAD | Freq: Every day | CUTANEOUS | Status: DC

## 2024-03-08 MED ORDER — SODIUM CHLORIDE 0.9% FLUSH
3.0000 mL | Freq: Once | INTRAVENOUS | Status: AC
  Administered 2024-03-08: 3 mL via INTRAVENOUS

## 2024-03-08 MED ORDER — LIDOCAINE 5 % EX PTCH
1.0000 | MEDICATED_PATCH | CUTANEOUS | Status: DC
  Administered 2024-03-08: 1 via TRANSDERMAL
  Filled 2024-03-08: qty 1

## 2024-03-08 MED ORDER — HYDROMORPHONE HCL 1 MG/ML IJ SOLN
0.5000 mg | Freq: Once | INTRAMUSCULAR | Status: AC
  Administered 2024-03-08: 0.5 mg via INTRAVENOUS
  Filled 2024-03-08: qty 1

## 2024-03-08 MED ORDER — OXYCODONE-ACETAMINOPHEN 5-325 MG PO TABS
1.0000 | ORAL_TABLET | Freq: Once | ORAL | Status: AC
  Administered 2024-03-08: 1 via ORAL
  Filled 2024-03-08: qty 1

## 2024-03-08 NOTE — Progress Notes (Signed)
 VAST consult received to obtain PIV access. Pt currently has no IV meds/fluids or studies ordered. Best practice is to place IV access only when needed to allow for vein preservation and reduce the risk of infection from unneccessary invasive procedures. Currently appears that patient needs labs collected and hemodialysis treatment. Advised pt's nurse if pt's condition changes and IV access is needed, IV team consult should be placed at that time.

## 2024-03-08 NOTE — ED Notes (Signed)
 Pt transported to Xray via stretcher

## 2024-03-08 NOTE — Progress Notes (Signed)
 VAST consult once again received to obtain PIV access. Pt currently has no IV meds/fluids or studies ordered. Best practice is to place IV access only when needed to allow for vein preservation and reduce the risk of infection from unneccessary invasive procedures. Spoke with patient's nurse, Pearletha Forge who stated no one has been able to collect labs on this patient. Advised that labs could be obtained at the beginning of dialysis treatment. She spoke with physician who then ordered Dilaudid.

## 2024-03-08 NOTE — ED Notes (Signed)
 MD and charge RN made aware that patient is inappropriate for hallway bed. No rooms currently available per charge.

## 2024-03-08 NOTE — Progress Notes (Signed)
 Attempted to reach pt via phone and unable to leave a message. Attempted to reach pt via ED phone and no answer. HIPAA compliant text sent to pt stating out-pt HD arrangements are still pending with no confirmed start date. Will f/u with pt once that info is available. Pt advised he can return call to navigator as well if needed. Update provided to nephrologist as well.   Olivia Canter Renal Navigator [phone]

## 2024-03-08 NOTE — ED Provider Notes (Signed)
 Patient care taken over at shift change.  Presents the ED today with worsening shortness of breath and he has missed his last dialysis appointment yesterday.  He is on 4 L nasal cannula baseline now on 6 L.  No chest pain. Physical Exam  BP (!) 141/57 (BP Location: Right Wrist)   Pulse 95   Temp 98.1 F (36.7 C) (Oral)   Resp 14   Ht 5\' 5"  (1.651 m)   SpO2 95%   BMI 48.76 kg/m   Physical Exam  Procedures  Procedures  ED Course / MDM   Clinical Course as of 03/08/24 1618  Fri Mar 08, 2024  1336 Messaged nephro about dialysis who agree to set up dialysis.  [JB]  [id number] Nephrology will put in orders, Ramona Burner PA-c [JB]    Clinical Course User Index [JB] Barrett, Kandace Organ, PA-C   Medical Decision Making Amount and/or Complexity of Data Reviewed Labs: ordered. Radiology: ordered.  Risk Prescription drug management.   CXR is limited due to body habitus.  Shows enlarged heart.  No significant pleural effusion or pneumothorax.  20:20 - Nurse spoke with dialysis nurse who states patient is on the schedule to get dialysis tonight.  21:54 - Patient taken to dialysis. Will need reassessment in the ED after.    Sonnie Dusky, PA-C 03/08/24 2155    Tegeler, Marine Sia, MD 03/09/24 [phone]

## 2024-03-08 NOTE — Telephone Encounter (Signed)
 Closing referral, spoke to patient to reschedule his appointment. He stated that he was in the building and wanted to know what I wanted. I told him that we had a referral for him to see a cardiologist and that he had an appointment on Monday but was marked as a NO SHOW. He then became agitated and told me to "not start that no show business" - he said that he did not feel good today and did not want to play on the phone. I advised that since he was in the building he could just go to the office and make an appointment with one of the front desk ladies. He was still irritated and wanted to know what we had available but that he did not feel good and did not, once again, want to play on the phone. I told him that I could call him back another day and then I could hear him in the background talking to someone saying profanity and stating, "f*ck that woman" so I did end the call.

## 2024-03-08 NOTE — ED Notes (Signed)
 Pt admitted to [facility]

## 2024-03-08 NOTE — ED Notes (Signed)
 Confirmed with dialysis RN that pt is on their list

## 2024-03-08 NOTE — ED Notes (Signed)
 Unable to get labs x2 CE

## 2024-03-08 NOTE — ED Triage Notes (Addendum)
 Pt BIB [facility] from physical therapy C/O SOB. Dialysis T-Th-Sa, missed yesterday. 4L [place] at baseline, requiring 6L currently.

## 2024-03-08 NOTE — ED Provider Notes (Signed)
 [facility] Provider Note   CSN: [id number] Arrival date & time: 03/08/24  1211     History  Chief Complaint  Patient presents with   Shortness of Breath    Jeffrey [name] is a 63 y.o. male.  With past medical history of end-stage renal disease on dialysis, hypertension, COPD, GERD, DM, CHF presenting with complaint of shortness of breath.  Patient reports that he normally gets dialysis Tuesday Thursday Saturday.  Reports that yesterday he missed dialysis and now he is quite short of breath.  He reports he feels like he is having to have increased work of breathing.  He has had to increase his oxygen from 4 L to 6 L.  He feels quite fluid overloaded.  He also reports that he has chronic neck pain and he feels that it is very sore and requesting pain medication.  Reports it is worse from moving head from side-to-side.  He is requesting Dilaudid.  He denies any chest pain or chest tightness, fevers, chills, cough.  Otherwise feels at baseline.   Shortness of Breath      Home Medications Prior to Admission medications   Medication Sig Start Date End Date Taking? Authorizing Provider  acetaminophen (TYLENOL) 325 MG tablet Take 2 tablets (650 mg total) by mouth every 6 (six) hours. 07/23/23   Lockie Mola, MD  amLODipine (NORVASC) 10 MG tablet Take 10 mg by mouth every morning. 04/17/20   [provider]  ASPIRIN LOW DOSE 81 MG EC tablet Take 81 mg by mouth in the morning. 02/22/21   [provider]  atorvastatin (LIPITOR) 40 MG tablet Take 40 mg by mouth in the morning. 02/09/21   [provider]  calcium acetate (PHOSLO) 667 MG capsule Take 2 capsules (1,334 mg total) by mouth with breakfast, with lunch, and with evening meal. 12/21/23   Ejigiri, Megan Mans, PA-C  carvedilol (COREG) 12.5 MG tablet Take 12.5 mg by mouth 2 (two) times daily with a meal. 04/17/20   [provider]  clopidogrel (PLAVIX) 75 MG  tablet Take 1 tablet (75 mg total) by mouth daily. 11/02/22   Rhyne, Ames Coupe, PA-C  diazepam (VALIUM) 5 MG tablet Take 5 mg by mouth 2 (two) times daily as needed for muscle spasms. 12/09/21   [provider]  Fluticasone-Umeclidin-Vilant (TRELEGY ELLIPTA IN) Inhale 1 puff into the lungs daily.    [provider]  insulin lispro (HUMALOG KWIKPEN) 100 UNIT/ML KwikPen Inject 0-9 Units into the skin 3 (three) times daily with meals. CBG < 70: treat low blood sugar CBG 70 - 120: 0 units  CBG 121 - 150: 1 unit  CBG 151 - 200: 2 units  CBG 201 - 250: 3 units  CBG 251 - 300: 5 units  CBG 301 - 350: 7 units  CBG 351 - 400: 9 units  CBG > 400: call MD 09/27/23   Zigmund Daniel., MD  insulin regular human CONCENTRATED (HUMULIN R U-500 KWIKPEN) 500 UNIT/ML KwikPen Inject 55 Units into the skin 2 (two) times daily with a meal. Follow up with your PCP and [facility] for additional adjustments 09/27/23 10/27/23  Zigmund Daniel., MD  iron polysaccharides (NIFEREX) 150 MG capsule Take 1 capsule (150 mg total) by mouth daily. 09/26/23   Meredeth Ide, MD  nortriptyline (PAMELOR) 10 MG capsule Take 20 mg by mouth at bedtime.    [provider]  pantoprazole (PROTONIX) 40 MG  tablet Take 40 mg by mouth daily before breakfast. 02/09/21   [provider]  pregabalin (LYRICA) 75 MG capsule Take 1 capsule (75 mg total) by mouth See admin instructions. Daily.  Give after dialysis on dialysis days. Patient taking differently: Take 75 mg by mouth daily. 01/21/23   [name], MD  sertraline (ZOLOFT) 100 MG tablet Take 100 mg by mouth in the morning. 02/22/21   [provider]  tamsulosin (FLOMAX) 0.4 MG CAPS capsule Take 1 capsule (0.4 mg total) by mouth daily. 11/10/21   [name], MD  torsemide (DEMADEX) 20 MG tablet Take 120 mg by mouth 2 (two) times daily.    [provider]  traZODone (DESYREL) 150 MG tablet Take 150 mg by mouth at bedtime.  12/09/21   [provider]  Vitamin D, Ergocalciferol, (DRISDOL) 1.25 MG (50000 UNIT) CAPS capsule Take 1 capsule (50,000 Units total) by mouth every 7 (seven) days. 10/02/23   Meredeth Ide, MD      Allergies    Actos [pioglitazone], Dexmedetomidine, Ibuprofen, Tomato, and Wellbutrin [bupropion]    Review of Systems   Review of Systems  Respiratory:  Positive for shortness of breath.     Physical Exam Updated Vital Signs BP (!) 141/57 (BP Location: Right Wrist)   Pulse 95   Temp 98.1 F (36.7 C) (Oral)   Resp 14   Ht 5\' 5"  (1.651 m)   SpO2 95%   BMI 48.76 kg/m  Physical Exam Vitals and nursing note reviewed.  Constitutional:      General: He is not in acute distress.    Appearance: He is not toxic-appearing.  HENT:     Head: Normocephalic and atraumatic.  Eyes:     General: No scleral icterus.    Conjunctiva/sclera: Conjunctivae normal.  Cardiovascular:     Rate and Rhythm: Normal rate and regular rhythm.     Pulses: Normal pulses.     Heart sounds: Normal heart sounds.  Pulmonary:     Effort: Pulmonary effort is normal. No respiratory distress.     Breath sounds: Rales present.  Abdominal:     General: Abdomen is flat. Bowel sounds are normal.     Palpations: Abdomen is soft.     Tenderness: There is no abdominal tenderness.  Skin:    General: Skin is warm and dry.     Findings: No lesion.  Neurological:     General: No focal deficit present.     Mental Status: He is alert and oriented to person, place, and time. Mental status is at baseline.     ED Results / Procedures / Treatments   Labs (all labs ordered are listed, but only abnormal results are displayed) Labs Reviewed  BASIC METABOLIC PANEL WITH GFR  CBC  HEPATITIS B SURFACE ANTIGEN  HEPATITIS B SURFACE ANTIBODY, QUANTITATIVE    EKG None  Radiology No results found.  Procedures Procedures    Medications Ordered in ED Medications  sodium chloride flush (NS) 0.9 % injection 3 mL  (has no administration in time range)    ED Course/ Medical Decision Making/ A&P Clinical Course as of 03/08/24 1451  Fri Mar 08, 2024  1336 Messaged nephro about dialysis who agree to set up dialysis.  [JB]  [id number] Nephrology will put in orders, [name] PA-c [JB]    Clinical Course User Index [JB] Jamisyn Langer, Horald Chestnut, PA-C  Medical Decision Making Amount and/or Complexity of Data Reviewed Labs: ordered. Radiology: ordered.   This patient presents to the ED for concern of SOB, this involves an extensive number of treatment options, and is a complaint that carries with it a high risk of complications and morbidity.  The differential diagnosis includes ACS, CHF, ESRD, PNA, URI, PE   Co morbidities that complicate the patient evaluation  ESRD on dialysis    Additional history obtained:  Additional history obtained from 03/02/24   Lab Tests:  I personally interpreted labs.  The pertinent results include:   CBC, BMP.    Imaging Studies ordered:  I ordered imaging studies including chest x-ray  Pending    Cardiac Monitoring: / EKG:  The patient was maintained on a cardiac monitor.    Consultations Obtained:  I requested consultation with the nephrology,  and discussed lab and imaging findings as well as pertinent plan - they recommend: dialysis    Problem List / ED Course / Critical interventions / Medication management  Reporting to emergency room with shortness of breath.  Patient does have end-stage renal disease on dialysis.  He did miss his last dialysis scheduled.  He is hemodynamically stable and well-appearing.  He has normal work of breathing.  He is not hypoxic.  He does have rales on exam.  Will order chest x-ray.  He denies any fevers or chills.  He is requiring 6 L nasal cannula instead of his typical 4.  He is also requesting pain medication for his neck.  He denies any acute change in symptoms.  He dose appear fluid  overloaded. Will reach out to nephrology, as I feel patient would benefit from dialysis for shortness of breath and increase oxygen demand.  I ordered medication including Lidoderm patch, Percocet for acute on chronic neck pain Reevaluation of the patient after these medicines showed that the patient improved I have reviewed the patients home medicines and have made adjustments as needed   Plan  ED dialysis due to fluid overload.  Patient signed out to [facility], [place] pending repeat assessment after patient is done with dialysis.        Final Clinical Impression(s) / ED Diagnoses Final diagnoses:  ESRD on dialysis [facility])    Rx / DC Orders ED Discharge Orders     None         Raford Pitcher Evalee Jefferson 03/08/24 1505    Rondel Baton, MD 03/08/24 1732

## 2024-03-09 DIAGNOSIS — N186 End stage renal disease: Secondary | ICD-10-CM | POA: Diagnosis not present

## 2024-03-09 LAB — HEPATITIS B SURFACE ANTIBODY, QUANTITATIVE: Hep B S AB Quant (Post): 27.6 m[IU]/mL

## 2024-03-09 MED ORDER — ONDANSETRON 4 MG PO TBDP: 4.0000 mg | ORAL_TABLET | Freq: Once | ORAL | Status: DC

## 2024-03-09 MED ORDER — ONDANSETRON HCL 4 MG/2ML IJ SOLN
4.0000 mg | Freq: Once | INTRAMUSCULAR | Status: AC
  Administered 2024-03-09: 4 mg via INTRAVENOUS

## 2024-03-09 NOTE — ED Provider Notes (Signed)
  4:15 AM Returned from HD.  Feeling better.  Currently on his baseline 3L with stable vitals.  Seems stable for discharge.   Coretha Dew, PA-C 03/09/24 6578    Roberts Ching, MD 03/10/24 0500

## 2024-03-09 NOTE — ED Notes (Signed)
 Pt arrived from dialysis. Pt a/o to baseline, no signs of distress at this time. EDP made aware.

## 2024-03-09 NOTE — ED Notes (Signed)
 Pt vomiting/ scant amount

## 2024-03-12 NOTE — Progress Notes (Signed)
 Late Note entry- March 12, 2024  Navigator advised this morning that pt will not be able to be accepted for out-pt HD at local [facility] clinic at this time. Pt can possibly be reconsidered in the future. Contacted pt via phone to make him aware of this info and to advise pt that he will need to continue to come to ED for assessment of HD needs. Update provided to nephrologist on duty this week and renal staff. Will contact renal network CSW with update as well.   Lauraine Polite Renal Navigator [phone]

## 2024-03-14 ENCOUNTER — Emergency Department (HOSPITAL_COMMUNITY)
Admission: EM | Admit: 2024-03-14 | Discharge: 2024-03-14 | Disposition: A | Discharge status: Home / Self Care | Attending: Nephrology | Admitting: Nephrology

## 2024-03-14 ENCOUNTER — Encounter (HOSPITAL_COMMUNITY): Payer: Self-pay | Admitting: *Deleted

## 2024-03-14 ENCOUNTER — Other Ambulatory Visit: Payer: Self-pay

## 2024-03-14 DIAGNOSIS — Z5321 Procedure and treatment not carried out due to patient leaving prior to being seen by health care provider: Secondary | ICD-10-CM | POA: Insufficient documentation

## 2024-03-14 DIAGNOSIS — Y732 Prosthetic and other implants, materials and accessory gastroenterology and urology devices associated with adverse incidents: Secondary | ICD-10-CM | POA: Diagnosis not present

## 2024-03-14 DIAGNOSIS — T8249XA Other complication of vascular dialysis catheter, initial encounter: Secondary | ICD-10-CM | POA: Insufficient documentation

## 2024-03-14 LAB — CBC
HCT: 25.2 % — ABNORMAL LOW (ref 39.0–52.0)
Hemoglobin: 7.9 g/dL — ABNORMAL LOW (ref 13.0–17.0)
MCH: 29.4 pg (ref 26.0–34.0)
MCHC: 31.3 g/dL (ref 30.0–36.0)
MCV: 93.7 fL (ref 80.0–100.0)
Platelets: 128 10*3/uL — ABNORMAL LOW (ref 150–400)
RBC: 2.69 MIL/uL — ABNORMAL LOW (ref 4.22–5.81)
RDW: 15.8 % — ABNORMAL HIGH (ref 11.5–15.5)
WBC: 7 10*3/uL (ref 4.0–10.5)
nRBC: 0 % (ref 0.0–0.2)

## 2024-03-14 LAB — RENAL FUNCTION PANEL
Albumin: 3.4 g/dL — ABNORMAL LOW (ref 3.5–5.0)
Anion gap: 16 — ABNORMAL HIGH (ref 5–15)
BUN: 130 mg/dL — ABNORMAL HIGH (ref 8–23)
CO2: 24 mmol/L (ref 22–32)
Calcium: 6.8 mg/dL — ABNORMAL LOW (ref 8.9–10.3)
Chloride: 99 mmol/L (ref 98–111)
Creatinine, Ser: 7.47 mg/dL — ABNORMAL HIGH (ref 0.61–1.24)
GFR, Estimated: 8 mL/min — ABNORMAL LOW (ref >60)
Glucose, Bld: 92 mg/dL (ref 70–99)
Phosphorus: 8.4 mg/dL — ABNORMAL HIGH (ref 2.5–4.6)
Potassium: 4.1 mmol/L (ref 3.5–5.1)
Sodium: 139 mmol/L (ref 135–145)

## 2024-03-14 MED ORDER — CHLORHEXIDINE GLUCONATE CLOTH 2 % EX PADS: 6.0000 | MEDICATED_PAD | Freq: Every day | CUTANEOUS | Status: DC

## 2024-03-14 MED ORDER — NEPRO/CARBSTEADY PO LIQD: 237.0000 mL | ORAL | Status: DC | PRN

## 2024-03-14 MED ORDER — PENTAFLUOROPROP-TETRAFLUOROETH EX AERO: 1.0000 | INHALATION_SPRAY | CUTANEOUS | Status: DC | PRN

## 2024-03-14 MED ORDER — HEPARIN SODIUM (PORCINE) 1000 UNIT/ML DIALYSIS: 1000.0000 [IU] | INTRAMUSCULAR | Status: DC | PRN

## 2024-03-14 MED ORDER — ANTICOAGULANT SODIUM CITRATE 4% (200MG/5ML) IV SOLN: 5.0000 mL | Status: DC | PRN

## 2024-03-14 MED ORDER — HEPARIN SODIUM (PORCINE) 1000 UNIT/ML DIALYSIS
2000.0000 [IU] | Freq: Once | INTRAMUSCULAR | Status: AC
  Administered 2024-03-14: 2000 [IU] via INTRAVENOUS_CENTRAL
  Filled 2024-03-14: qty 2

## 2024-03-14 NOTE — ED Triage Notes (Signed)
 Pt is here for HD, he states that his last treatment was Saturday.  Pt usually comes here for HD T,Th,S but was not able to come Tuesday.  No distress noted in triage.

## 2024-03-14 NOTE — Progress Notes (Signed)
 Received patient in ED stretcher bed.Alert and oriented x4.He signed his hd treatment consent.  Access used : Left arm AVG that worked well.   Duration of treatment : 3 hours.  UF goal:  Met 2.5 liters  Hand off to the patient's nurse to E.D via transporter for discharge.

## 2024-03-18 NOTE — Progress Notes (Signed)
 Late Note Entry- March 18, 2024  Navigator received call from pt this morning stating he is trying to move to [place], [place] as soon as out-pt HD can be arranged. Pt states he has been approved for an appt at [address] [place], [place] [address]. Pt states he is agreeable to referral being made to [facility] or [facility] (wherever pt will be accepted). Referral submitted to [facility] admissions this afternoon for review. Will need a Hep B total core antibody drawn the next time pt comes for HD to provide to [facility] for referral. Nephrologist and renal PA made aware of request for lab. Will contact [facility] admissions to see if any clinic in [place] would be an option. Will contact CSW with the [facility] program through the renal network as well to provide an update. Will assist as needed.   Lauraine Polite Renal Navigator [phone]

## 2024-03-21 ENCOUNTER — Other Ambulatory Visit: Payer: Self-pay

## 2024-03-21 ENCOUNTER — Encounter (HOSPITAL_COMMUNITY): Payer: Self-pay

## 2024-03-21 ENCOUNTER — Emergency Department (HOSPITAL_COMMUNITY)

## 2024-03-21 ENCOUNTER — Inpatient Hospital Stay (HOSPITAL_COMMUNITY)
Admission: EM | Admit: 2024-03-21 | Discharge: 2024-03-26 | DRG: 308 | Disposition: A | Discharge status: Home / Self Care | Attending: Family Medicine | Admitting: Family Medicine

## 2024-03-21 DIAGNOSIS — E88819 Insulin resistance, unspecified: Secondary | ICD-10-CM | POA: Diagnosis present

## 2024-03-21 DIAGNOSIS — Z6841 Body Mass Index (BMI) 40.0 and over, adult: Secondary | ICD-10-CM | POA: Diagnosis not present

## 2024-03-21 DIAGNOSIS — N186 End stage renal disease: Secondary | ICD-10-CM | POA: Diagnosis present

## 2024-03-21 DIAGNOSIS — I2510 Atherosclerotic heart disease of native coronary artery without angina pectoris: Secondary | ICD-10-CM | POA: Diagnosis present

## 2024-03-21 DIAGNOSIS — Z992 Dependence on renal dialysis: Secondary | ICD-10-CM | POA: Diagnosis not present

## 2024-03-21 DIAGNOSIS — R112 Nausea with vomiting, unspecified: Secondary | ICD-10-CM | POA: Diagnosis not present

## 2024-03-21 DIAGNOSIS — E1122 Type 2 diabetes mellitus with diabetic chronic kidney disease: Secondary | ICD-10-CM | POA: Diagnosis present

## 2024-03-21 DIAGNOSIS — Z89612 Acquired absence of left leg above knee: Secondary | ICD-10-CM

## 2024-03-21 DIAGNOSIS — D649 Anemia, unspecified: Secondary | ICD-10-CM | POA: Diagnosis not present

## 2024-03-21 DIAGNOSIS — Z91018 Allergy to other foods: Secondary | ICD-10-CM

## 2024-03-21 DIAGNOSIS — J9610 Chronic respiratory failure, unspecified whether with hypoxia or hypercapnia: Secondary | ICD-10-CM | POA: Diagnosis present

## 2024-03-21 DIAGNOSIS — E1151 Type 2 diabetes mellitus with diabetic peripheral angiopathy without gangrene: Secondary | ICD-10-CM | POA: Diagnosis present

## 2024-03-21 DIAGNOSIS — Z87891 Personal history of nicotine dependence: Secondary | ICD-10-CM

## 2024-03-21 DIAGNOSIS — D631 Anemia in chronic kidney disease: Secondary | ICD-10-CM | POA: Diagnosis present

## 2024-03-21 DIAGNOSIS — Z5986 Financial insecurity: Secondary | ICD-10-CM

## 2024-03-21 DIAGNOSIS — I469 Cardiac arrest, cause unspecified: Secondary | ICD-10-CM | POA: Diagnosis not present

## 2024-03-21 DIAGNOSIS — Z8249 Family history of ischemic heart disease and other diseases of the circulatory system: Secondary | ICD-10-CM | POA: Diagnosis not present

## 2024-03-21 DIAGNOSIS — I4721 Torsades de pointes: Secondary | ICD-10-CM | POA: Diagnosis present

## 2024-03-21 DIAGNOSIS — F32A Depression, unspecified: Secondary | ICD-10-CM | POA: Diagnosis present

## 2024-03-21 DIAGNOSIS — R000 Tachycardia, unspecified: Secondary | ICD-10-CM | POA: Diagnosis not present

## 2024-03-21 DIAGNOSIS — I132 Hypertensive heart and chronic kidney disease with heart failure and with stage 5 chronic kidney disease, or end stage renal disease: Secondary | ICD-10-CM | POA: Diagnosis present

## 2024-03-21 DIAGNOSIS — Z794 Long term (current) use of insulin: Secondary | ICD-10-CM

## 2024-03-21 DIAGNOSIS — Z888 Allergy status to other drugs, medicaments and biological substances status: Secondary | ICD-10-CM

## 2024-03-21 DIAGNOSIS — G8929 Other chronic pain: Secondary | ICD-10-CM | POA: Diagnosis present

## 2024-03-21 DIAGNOSIS — Z79899 Other long term (current) drug therapy: Secondary | ICD-10-CM

## 2024-03-21 DIAGNOSIS — I5032 Chronic diastolic (congestive) heart failure: Secondary | ICD-10-CM | POA: Diagnosis present

## 2024-03-21 DIAGNOSIS — I472 Ventricular tachycardia, unspecified: Principal | ICD-10-CM

## 2024-03-21 DIAGNOSIS — M898X9 Other specified disorders of bone, unspecified site: Secondary | ICD-10-CM | POA: Diagnosis present

## 2024-03-21 DIAGNOSIS — R079 Chest pain, unspecified: Secondary | ICD-10-CM | POA: Diagnosis present

## 2024-03-21 DIAGNOSIS — Z993 Dependence on wheelchair: Secondary | ICD-10-CM

## 2024-03-21 DIAGNOSIS — N400 Enlarged prostate without lower urinary tract symptoms: Secondary | ICD-10-CM | POA: Diagnosis present

## 2024-03-21 DIAGNOSIS — J449 Chronic obstructive pulmonary disease, unspecified: Secondary | ICD-10-CM | POA: Diagnosis present

## 2024-03-21 DIAGNOSIS — N2581 Secondary hyperparathyroidism of renal origin: Secondary | ICD-10-CM | POA: Diagnosis present

## 2024-03-21 DIAGNOSIS — Z96653 Presence of artificial knee joint, bilateral: Secondary | ICD-10-CM | POA: Diagnosis present

## 2024-03-21 DIAGNOSIS — E669 Obesity, unspecified: Secondary | ICD-10-CM | POA: Diagnosis present

## 2024-03-21 DIAGNOSIS — E1165 Type 2 diabetes mellitus with hyperglycemia: Secondary | ICD-10-CM | POA: Diagnosis present

## 2024-03-21 DIAGNOSIS — Z8614 Personal history of Methicillin resistant Staphylococcus aureus infection: Secondary | ICD-10-CM

## 2024-03-21 DIAGNOSIS — I5030 Unspecified diastolic (congestive) heart failure: Secondary | ICD-10-CM | POA: Diagnosis not present

## 2024-03-21 DIAGNOSIS — G4733 Obstructive sleep apnea (adult) (pediatric): Secondary | ICD-10-CM | POA: Diagnosis present

## 2024-03-21 DIAGNOSIS — M436 Torticollis: Secondary | ICD-10-CM | POA: Diagnosis present

## 2024-03-21 DIAGNOSIS — I252 Old myocardial infarction: Secondary | ICD-10-CM

## 2024-03-21 DIAGNOSIS — Z91158 Patient's noncompliance with renal dialysis for other reason: Secondary | ICD-10-CM

## 2024-03-21 DIAGNOSIS — I350 Nonrheumatic aortic (valve) stenosis: Secondary | ICD-10-CM | POA: Diagnosis present

## 2024-03-21 DIAGNOSIS — G4730 Sleep apnea, unspecified: Secondary | ICD-10-CM | POA: Diagnosis present

## 2024-03-21 DIAGNOSIS — K219 Gastro-esophageal reflux disease without esophagitis: Secondary | ICD-10-CM | POA: Diagnosis present

## 2024-03-21 DIAGNOSIS — F4310 Post-traumatic stress disorder, unspecified: Secondary | ICD-10-CM | POA: Diagnosis present

## 2024-03-21 DIAGNOSIS — Z7902 Long term (current) use of antithrombotics/antiplatelets: Secondary | ICD-10-CM

## 2024-03-21 LAB — CBC
HCT: 24.4 % — ABNORMAL LOW (ref 39.0–52.0)
Hemoglobin: 7.2 g/dL — ABNORMAL LOW (ref 13.0–17.0)
MCH: 28.6 pg (ref 26.0–34.0)
MCHC: 29.5 g/dL — ABNORMAL LOW (ref 30.0–36.0)
MCV: 96.8 fL (ref 80.0–100.0)
Platelets: 127 10*3/uL — ABNORMAL LOW (ref 150–400)
RBC: 2.52 MIL/uL — ABNORMAL LOW (ref 4.22–5.81)
RDW: 15.6 % — ABNORMAL HIGH (ref 11.5–15.5)
WBC: 5.5 10*3/uL (ref 4.0–10.5)
nRBC: 0 % (ref 0.0–0.2)

## 2024-03-21 LAB — RENAL FUNCTION PANEL
Albumin: 3.3 g/dL — ABNORMAL LOW (ref 3.5–5.0)
Anion gap: 14 (ref 5–15)
BUN: 133 mg/dL — ABNORMAL HIGH (ref 8–23)
CO2: 23 mmol/L (ref 22–32)
Calcium: 6.6 mg/dL — ABNORMAL LOW (ref 8.9–10.3)
Chloride: 104 mmol/L (ref 98–111)
Creatinine, Ser: 7.2 mg/dL — ABNORMAL HIGH (ref 0.61–1.24)
GFR, Estimated: 8 mL/min — ABNORMAL LOW (ref >60)
Glucose, Bld: 127 mg/dL — ABNORMAL HIGH (ref 70–99)
Phosphorus: 7.3 mg/dL — ABNORMAL HIGH (ref 2.5–4.6)
Potassium: 4.2 mmol/L (ref 3.5–5.1)
Sodium: 141 mmol/L (ref 135–145)

## 2024-03-21 LAB — HEPATITIS B SURFACE ANTIBODY,QUALITATIVE: Hep B S Ab: REACTIVE — AB

## 2024-03-21 LAB — TROPONIN I (HIGH SENSITIVITY)
Troponin I (High Sensitivity): 19 ng/L — ABNORMAL HIGH (ref <18)
Troponin I (High Sensitivity): 21 ng/L — ABNORMAL HIGH (ref <18)

## 2024-03-21 LAB — MAGNESIUM: Magnesium: 1.5 mg/dL — ABNORMAL LOW (ref 1.7–2.4)

## 2024-03-21 MED ORDER — OXYCODONE-ACETAMINOPHEN 5-325 MG PO TABS
1.0000 | ORAL_TABLET | ORAL | Status: AC | PRN
  Administered 2024-03-21 (×2): 1 via ORAL
  Filled 2024-03-21 (×2): qty 1

## 2024-03-21 MED ORDER — MAGNESIUM SULFATE 2 GM/50ML IV SOLN
2.0000 g | Freq: Once | INTRAVENOUS | Status: AC
  Administered 2024-03-21: 2 g via INTRAVENOUS
  Filled 2024-03-21: qty 50

## 2024-03-21 MED ORDER — CALCIUM GLUCONATE-NACL 1-0.675 GM/50ML-% IV SOLN
1.0000 g | Freq: Once | INTRAVENOUS | Status: AC
  Administered 2024-03-21: 1000 mg via INTRAVENOUS
  Filled 2024-03-21: qty 50

## 2024-03-21 MED ORDER — DARBEPOETIN ALFA 200 MCG/0.4ML IJ SOSY
200.0000 ug | PREFILLED_SYRINGE | Freq: Once | INTRAMUSCULAR | Status: AC
  Administered 2024-03-21: 200 ug via SUBCUTANEOUS
  Filled 2024-03-21: qty 0.4

## 2024-03-21 MED ORDER — CHLORHEXIDINE GLUCONATE CLOTH 2 % EX PADS
6.0000 | MEDICATED_PAD | Freq: Every day | CUTANEOUS | Status: DC
  Administered 2024-03-23 – 2024-03-25 (×3): 6 via TOPICAL

## 2024-03-21 NOTE — ED Triage Notes (Signed)
 Patient here for dialysis on his baseline 3L [place] no respiratory complaints. Complains of severe chronic neck pain.

## 2024-03-21 NOTE — ED Notes (Signed)
 IV team deaccessed port..will return to gain new access because physician is bedside.

## 2024-03-21 NOTE — Progress Notes (Signed)
 Referrals to [facility] and [facility] (for a clinic in the [place] area) are currently pending. Navigator will fax Hep B total core antibody lab to [facility] admissions once result is available. Will assist as needed.   Lauraine Polite Renal Navigator [phone]
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
## 2024-03-21 NOTE — ED Provider Notes (Signed)
 [place] EMERGENCY DEPARTMENT AT [facility] Provider Note   CSN: [id number] Arrival date & time: 03/21/24  4098     History {Add pertinent medical, surgical, social history, OB history to HPI:1} Chief Complaint  Patient presents with   Torticollis   Vascular Access Problem    Jeffrey [name] is a 63 y.o. male.  He has a history of end-stage renal disease on dialysis.  He also has COPD CHF diabetes.  He was here to get routine dialysis.  Reportedly up in dialysis about 2 hours into his run he began experiencing some pain and cramping in his right arm which generalized to his right chest with severe pressure.  He alerted staff who stopped his dialysis.  Became unresponsive and was noted to be in V. tach.  It does not sound like any shock was given and he spontaneously converted back.  Brought down here for further evaluation.  He endorses feeling tired and still has some discomfort in his chest and right arm.  He said he has never had that happen before during dialysis.  The history is provided by the patient.  Chest Pain Pain location:  R chest Pain quality: aching and pressure   Pain radiates to:  R arm Pain severity:  Severe Onset quality:  Sudden Progression:  Resolved Associated symptoms: fatigue and shortness of breath   Associated symptoms: no nausea and no vomiting        Home Medications Prior to Admission medications   Medication Sig Start Date End Date Taking? Authorizing Provider  acetaminophen  (TYLENOL ) 325 MG tablet Take 2 tablets (650 mg total) by mouth every 6 (six) hours. 07/23/23   Santa Cuba, MD  amLODipine  (NORVASC ) 10 MG tablet Take 10 mg by mouth every morning. 04/17/20   [provider]  ASPIRIN  LOW DOSE 81 MG EC tablet Take 81 mg by mouth in the morning. 02/22/21   [provider]  atorvastatin  (LIPITOR) 40 MG tablet Take 40 mg by mouth in the morning. 02/09/21   [provider]  calcium  acetate (PHOSLO ) 667 MG capsule  Take 2 capsules (1,334 mg total) by mouth with breakfast, with lunch, and with evening meal. 12/21/23   Ejigiri, Juanda Noon, PA-C  carvedilol  (COREG ) 12.5 MG tablet Take 12.5 mg by mouth 2 (two) times daily with a meal. 04/17/20   [provider]  clopidogrel  (PLAVIX ) 75 MG tablet Take 1 tablet (75 mg total) by mouth daily. 11/02/22   Rhyne, Samantha J, PA-C  diazepam  (VALIUM ) 5 MG tablet Take 5 mg by mouth 2 (two) times daily as needed for muscle spasms. 12/09/21   [provider]  Fluticasone-Umeclidin-Vilant (TRELEGY ELLIPTA IN) Inhale 1 puff into the lungs daily.    [provider]  insulin  lispro (HUMALOG  KWIKPEN) 100 UNIT/ML KwikPen Inject 0-9 Units into the skin 3 (three) times daily with meals. CBG < 70: treat low blood sugar CBG 70 - 120: 0 units  CBG 121 - 150: 1 unit  CBG 151 - 200: 2 units  CBG 201 - 250: 3 units  CBG 251 - 300: 5 units  CBG 301 - 350: 7 units  CBG 351 - 400: 9 units  CBG > 400: call MD 09/27/23   Etter Hermann., MD  insulin  regular human CONCENTRATED (HUMULIN  R U-500 KWIKPEN) 500 UNIT/ML KwikPen Inject 55 Units into the skin 2 (two) times daily with a meal. Follow up with your PCP and [facility] for additional adjustments 09/27/23 10/27/23  Etter Hermann., MD  iron  polysaccharides (NIFEREX) 150 MG capsule Take 1 capsule (150 mg total) by mouth daily. 09/26/23   Ozell Blunt, MD  nortriptyline  (PAMELOR ) 10 MG capsule Take 20 mg by mouth at bedtime.    [provider]  pantoprazole  (PROTONIX ) 40 MG tablet Take 40 mg by mouth daily before breakfast. 02/09/21   [provider]  pregabalin  (LYRICA ) 75 MG capsule Take 1 capsule (75 mg total) by mouth See admin instructions. Daily.  Give after dialysis on dialysis days. Patient taking differently: Take 75 mg by mouth daily. 01/21/23   Trish Furl, MD  sertraline  (ZOLOFT ) 100 MG tablet Take 100 mg by mouth in the morning. 02/22/21   [provider]   tamsulosin  (FLOMAX ) 0.4 MG CAPS capsule Take 1 capsule (0.4 mg total) by mouth daily. 11/10/21   Modena Andes, MD  torsemide  (DEMADEX ) 20 MG tablet Take 120 mg by mouth 2 (two) times daily.    [provider]  traZODone  (DESYREL ) 150 MG tablet Take 150 mg by mouth at bedtime. 12/09/21   [provider]  Vitamin D , Ergocalciferol , (DRISDOL ) 1.25 MG (50000 UNIT) CAPS capsule Take 1 capsule (50,000 Units total) by mouth every 7 (seven) days. 10/02/23   Ozell Blunt, MD      Allergies    Actos [pioglitazone], Dexmedetomidine , Ibuprofen, Tomato, and Wellbutrin [bupropion]    Review of Systems   Review of Systems  Constitutional:  Positive for fatigue.  Respiratory:  Positive for shortness of breath.   Cardiovascular:  Positive for chest pain.  Gastrointestinal:  Negative for nausea and vomiting.    Physical Exam Updated Vital Signs BP (!) 161/74   Pulse 88   Temp 98.2 F (36.8 C)   Resp 11   Ht 5\' 5"  (1.651 m)   Wt 132.5 kg   SpO2 99%   BMI 48.59 kg/m  Physical Exam Vitals and nursing note reviewed.  Constitutional:      General: He is not in acute distress.    Appearance: Normal appearance. He is well-developed. He is obese.  HENT:     Head: Normocephalic and atraumatic.  Eyes:     Conjunctiva/sclera: Conjunctivae normal.  Cardiovascular:     Rate and Rhythm: Normal rate and regular rhythm.     Heart sounds: No murmur heard. Pulmonary:     Effort: Pulmonary effort is normal. No respiratory distress.     Breath sounds: Normal breath sounds.  Abdominal:     Palpations: Abdomen is soft.     Tenderness: There is no abdominal tenderness.  Musculoskeletal:        General: No swelling.     Cervical back: Neck supple.     Comments: Left AKA.  Fistula left upper arm that is still accessed.  Skin:    General: Skin is warm and dry.     Capillary Refill: Capillary refill takes less than 2 seconds.  Neurological:     General: No focal deficit present.      Mental Status: He is alert.     ED Results / Procedures / Treatments   Labs (all labs ordered are listed, but only abnormal results are displayed) Labs Reviewed  HEPATITIS B SURFACE ANTIBODY,QUALITATIVE - Abnormal; Notable for the following components:      Result Value   Hep B S Ab Reactive (*)    All other components within normal limits  RENAL FUNCTION PANEL - Abnormal; Notable for the following components:   Glucose,  Bld 127 (*)    BUN 133 (*)    Creatinine, Ser 7.20 (*)    Calcium  6.6 (*)    Phosphorus 7.3 (*)    Albumin  3.3 (*)    GFR, Estimated 8 (*)    All other components within normal limits  CBC - Abnormal; Notable for the following components:   RBC 2.52 (*)    Hemoglobin 7.2 (*)    HCT 24.4 (*)    MCHC 29.5 (*)    RDW 15.6 (*)    Platelets 127 (*)    All other components within normal limits  HEPATITIS B CORE ANTIBODY, TOTAL  MAGNESIUM   TROPONIN I (HIGH SENSITIVITY)    EKG EKG Interpretation Date/Time:  Thursday March 21 2024 15:03:01 EDT Ventricular Rate:  78 PR Interval:  152 QRS Duration:  84 QT Interval:  422 QTC Calculation: 481 R Axis:   74  Text Interpretation: Normal sinus rhythm Cannot rule out Anterior infarct , age undetermined Abnormal ECG When compared with ECG of 30-Jan-2024 13:11, No significant change since last tracing Confirmed by Racheal Buddle [phone]) on 03/21/2024 3:56:45 PM  Radiology No results found.  Procedures Procedures  {Document cardiac monitor, telemetry assessment procedure when appropriate:1}  Medications Ordered in ED Medications  oxyCODONE -acetaminophen  (PERCOCET/ROXICET) 5-325 MG per tablet 1 tablet (1 tablet Oral Given 03/21/24 0749)  Chlorhexidine  Gluconate Cloth 2 % PADS 6 each (has no administration in time range)  Darbepoetin Alfa  (ARANESP ) injection 200 mcg (has no administration in time range)    ED Course/ Medical Decision Making/ A&P   {   Click here for ABCD2, HEART and other calculatorsREFRESH  Note before signing :1}                              Medical Decision Making Amount and/or Complexity of Data Reviewed Labs: ordered. Radiology: ordered.  Risk Prescription drug management.   This patient complains of ***; this involves an extensive number of treatment Options and is a complaint that carries with it a high risk of complications and morbidity. The differential includes ***  I ordered, reviewed and interpreted labs, which included *** I ordered medication *** and reviewed PMP when indicated. I ordered imaging studies which included *** and I independently    visualized and interpreted imaging which showed *** Additional history obtained from *** Previous records obtained and reviewed *** I consulted *** and discussed lab and imaging findings and discussed disposition.  Cardiac monitoring reviewed, *** Social determinants considered, *** Critical Interventions: ***  After the interventions stated above, I reevaluated the patient and found *** Admission and further testing considered, ***   {Document critical care time when appropriate:1} {Document review of labs and clinical decision tools ie heart score, Chads2Vasc2 etc:1}  {Document your independent review of radiology images, and any outside records:1} {Document your discussion with family members, caretakers, and with consultants:1} {Document social determinants of health affecting pt's care:1} {Document your decision making why or why not admission, treatments were needed:1} Final Clinical Impression(s) / ED Diagnoses Final diagnoses:  None    Rx / DC Orders ED Discharge Orders     None

## 2024-03-21 NOTE — Progress Notes (Signed)
 Seen on HD - initially ok, then calling out that was cramping. Assessed him and turned off UF. Then he reported his chest was hurting. More fluid given and as I was walking to computer to look in his chart, the RN called out that he went unresponsive. Rhythm on tele was Vtach. Code blue called, but then after 15 seconds, rhythm converted back to NSR and he woke up. Changed from Code Blue to Rapid response. Vitals were ok, he was responsive. Stat EKG and troponin ordered. ED contacted and will send him back for evaluation.  Janus Mercury, PA-C [employer] Pager [phone]

## 2024-03-21 NOTE — Hospital Course (Signed)
 Jeffrey [name]

## 2024-03-21 NOTE — ED Notes (Signed)
IV team consulted- 

## 2024-03-22 ENCOUNTER — Encounter (HOSPITAL_COMMUNITY)
Admission: EM | Disposition: A | Discharge status: Home / Self Care | Payer: Self-pay | Source: Home / Self Care | Attending: Family Medicine

## 2024-03-22 ENCOUNTER — Inpatient Hospital Stay (HOSPITAL_COMMUNITY)

## 2024-03-22 DIAGNOSIS — Z8249 Family history of ischemic heart disease and other diseases of the circulatory system: Secondary | ICD-10-CM | POA: Diagnosis not present

## 2024-03-22 DIAGNOSIS — R079 Chest pain, unspecified: Secondary | ICD-10-CM | POA: Diagnosis not present

## 2024-03-22 DIAGNOSIS — R000 Tachycardia, unspecified: Secondary | ICD-10-CM

## 2024-03-22 DIAGNOSIS — I472 Ventricular tachycardia, unspecified: Secondary | ICD-10-CM

## 2024-03-22 DIAGNOSIS — I350 Nonrheumatic aortic (valve) stenosis: Secondary | ICD-10-CM

## 2024-03-22 DIAGNOSIS — D649 Anemia, unspecified: Secondary | ICD-10-CM | POA: Diagnosis not present

## 2024-03-22 LAB — GLUCOSE, CAPILLARY
Glucose-Capillary: 295 mg/dL — ABNORMAL HIGH (ref 70–99)
Glucose-Capillary: 346 mg/dL — ABNORMAL HIGH (ref 70–99)
Glucose-Capillary: 306 mg/dL — ABNORMAL HIGH (ref 70–99)

## 2024-03-22 LAB — CBC
HCT: 25.7 % — ABNORMAL LOW (ref 39.0–52.0)
HCT: 26.7 % — ABNORMAL LOW (ref 39.0–52.0)
Hemoglobin: 7.8 g/dL — ABNORMAL LOW (ref 13.0–17.0)
Hemoglobin: 7.7 g/dL — ABNORMAL LOW (ref 13.0–17.0)
MCH: 29.2 pg (ref 26.0–34.0)
MCH: 27.8 pg (ref 26.0–34.0)
MCHC: 30.4 g/dL (ref 30.0–36.0)
MCHC: 28.8 g/dL — ABNORMAL LOW (ref 30.0–36.0)
MCV: 96.3 fL (ref 80.0–100.0)
MCV: 96.4 fL (ref 80.0–100.0)
Platelets: 124 10*3/uL — ABNORMAL LOW (ref 150–400)
Platelets: 120 10*3/uL — ABNORMAL LOW (ref 150–400)
RBC: 2.67 MIL/uL — ABNORMAL LOW (ref 4.22–5.81)
RBC: 2.77 MIL/uL — ABNORMAL LOW (ref 4.22–5.81)
RDW: 15.5 % (ref 11.5–15.5)
RDW: 15.3 % (ref 11.5–15.5)
WBC: 6.2 10*3/uL (ref 4.0–10.5)
WBC: 5.9 10*3/uL (ref 4.0–10.5)
nRBC: 0 % (ref 0.0–0.2)
nRBC: 0 % (ref 0.0–0.2)

## 2024-03-22 LAB — BASIC METABOLIC PANEL WITH GFR
Anion gap: 13 (ref 5–15)
BUN: 94 mg/dL — ABNORMAL HIGH (ref 8–23)
CO2: 26 mmol/L (ref 22–32)
Calcium: 7.5 mg/dL — ABNORMAL LOW (ref 8.9–10.3)
Chloride: 100 mmol/L (ref 98–111)
Creatinine, Ser: 5.46 mg/dL — ABNORMAL HIGH (ref 0.61–1.24)
GFR, Estimated: 11 mL/min — ABNORMAL LOW (ref >60)
Glucose, Bld: 197 mg/dL — ABNORMAL HIGH (ref 70–99)
Potassium: 4.1 mmol/L (ref 3.5–5.1)
Sodium: 139 mmol/L (ref 135–145)

## 2024-03-22 LAB — ECHOCARDIOGRAM COMPLETE
AR max vel: 2.48 cm2
AV Area VTI: 2.67 cm2
AV Area mean vel: 2.52 cm2
AV Mean grad: 10 mmHg
AV Peak grad: 18.1 mmHg
Ao pk vel: 2.13 m/s
Area-P 1/2: 3.6 cm2
Height: 65 in
S' Lateral: 3.4 cm
Weight: 4672 [oz_av]

## 2024-03-22 LAB — PROTIME-INR
INR: 1.3 — ABNORMAL HIGH (ref 0.8–1.2)
Prothrombin Time: 16.2 s — ABNORMAL HIGH (ref 11.4–15.2)

## 2024-03-22 LAB — CBG MONITORING, ED: Glucose-Capillary: 231 mg/dL — ABNORMAL HIGH (ref 70–99)

## 2024-03-22 LAB — COMPREHENSIVE METABOLIC PANEL WITH GFR
ALT: 17 U/L (ref 0–44)
AST: 14 U/L — ABNORMAL LOW (ref 15–41)
Albumin: 3.4 g/dL — ABNORMAL LOW (ref 3.5–5.0)
Alkaline Phosphatase: 76 U/L (ref 38–126)
Anion gap: 15 (ref 5–15)
BUN: 93 mg/dL — ABNORMAL HIGH (ref 8–23)
CO2: 24 mmol/L (ref 22–32)
Calcium: 7.5 mg/dL — ABNORMAL LOW (ref 8.9–10.3)
Chloride: 99 mmol/L (ref 98–111)
Creatinine, Ser: 5.69 mg/dL — ABNORMAL HIGH (ref 0.61–1.24)
GFR, Estimated: 10 mL/min — ABNORMAL LOW (ref >60)
Glucose, Bld: 222 mg/dL — ABNORMAL HIGH (ref 70–99)
Potassium: 4.2 mmol/L (ref 3.5–5.1)
Sodium: 138 mmol/L (ref 135–145)
Total Bilirubin: 0.5 mg/dL (ref 0.0–1.2)
Total Protein: 6.9 g/dL (ref 6.5–8.1)

## 2024-03-22 LAB — MAGNESIUM
Magnesium: 2.4 mg/dL (ref 1.7–2.4)
Magnesium: 2.2 mg/dL (ref 1.7–2.4)

## 2024-03-22 LAB — HEMOGLOBIN A1C
Hgb A1c MFr Bld: 8.5 % — ABNORMAL HIGH (ref 4.8–5.6)
Mean Plasma Glucose: 197.25 mg/dL

## 2024-03-22 SURGERY — LEFT HEART CATH AND CORONARY ANGIOGRAPHY
Anesthesia: LOCAL

## 2024-03-22 MED ORDER — PANTOPRAZOLE SODIUM 40 MG PO TBEC
40.0000 mg | DELAYED_RELEASE_TABLET | Freq: Every day | ORAL | Status: DC
  Administered 2024-03-22 – 2024-03-26 (×4): 40 mg via ORAL
  Filled 2024-03-22 (×4): qty 1

## 2024-03-22 MED ORDER — POLYSACCHARIDE IRON COMPLEX 150 MG PO CAPS
150.0000 mg | ORAL_CAPSULE | Freq: Every day | ORAL | Status: DC
  Administered 2024-03-22 – 2024-03-26 (×4): 150 mg via ORAL
  Filled 2024-03-22 (×6): qty 1

## 2024-03-22 MED ORDER — NORTRIPTYLINE HCL 10 MG PO CAPS
20.0000 mg | ORAL_CAPSULE | Freq: Every day | ORAL | Status: DC
  Administered 2024-03-22 – 2024-03-25 (×4): 20 mg via ORAL
  Filled 2024-03-22 (×5): qty 2

## 2024-03-22 MED ORDER — HYDROCODONE-ACETAMINOPHEN 5-325 MG PO TABS
1.0000 | ORAL_TABLET | ORAL | Status: DC | PRN
  Administered 2024-03-22 – 2024-03-25 (×4): 1 via ORAL
  Filled 2024-03-22 (×3): qty 1

## 2024-03-22 MED ORDER — SODIUM CHLORIDE 0.9 % IV SOLN: INTRAVENOUS | Status: AC

## 2024-03-22 MED ORDER — ATORVASTATIN CALCIUM 40 MG PO TABS
40.0000 mg | ORAL_TABLET | Freq: Every morning | ORAL | Status: DC
  Administered 2024-03-22 – 2024-03-26 (×4): 40 mg via ORAL
  Filled 2024-03-22 (×4): qty 1

## 2024-03-22 MED ORDER — ASPIRIN 81 MG PO CHEW
81.0000 mg | CHEWABLE_TABLET | ORAL | Status: AC
  Administered 2024-03-22: 81 mg via ORAL
  Filled 2024-03-22: qty 1

## 2024-03-22 MED ORDER — SERTRALINE HCL 100 MG PO TABS
100.0000 mg | ORAL_TABLET | Freq: Every morning | ORAL | Status: DC
  Administered 2024-03-22 – 2024-03-26 (×4): 100 mg via ORAL
  Filled 2024-03-22 (×4): qty 1

## 2024-03-22 MED ORDER — PERFLUTREN LIPID MICROSPHERE
1.0000 mL | INTRAVENOUS | Status: AC | PRN
  Administered 2024-03-22: 2 mL via INTRAVENOUS

## 2024-03-22 MED ORDER — INSULIN REGULAR HUMAN (CONC) 500 UNIT/ML ~~LOC~~ SOPN
55.0000 [IU] | PEN_INJECTOR | Freq: Two times a day (BID) | SUBCUTANEOUS | Status: DC
  Administered 2024-03-23 – 2024-03-26 (×4): 55 [IU] via SUBCUTANEOUS

## 2024-03-22 MED ORDER — INSULIN REGULAR HUMAN (CONC) 500 UNIT/ML ~~LOC~~ SOPN
30.0000 [IU] | PEN_INJECTOR | Freq: Two times a day (BID) | SUBCUTANEOUS | Status: DC
Start: 2024-03-22 — End: 2024-03-22
  Administered 2024-03-22: 55 [IU] via SUBCUTANEOUS
  Filled 2024-03-22: qty 3

## 2024-03-22 MED ORDER — ACETAMINOPHEN 325 MG PO TABS
650.0000 mg | ORAL_TABLET | Freq: Four times a day (QID) | ORAL | Status: DC
  Administered 2024-03-22 – 2024-03-26 (×11): 650 mg via ORAL
  Filled 2024-03-22 (×12): qty 2

## 2024-03-22 MED ORDER — CYCLOBENZAPRINE HCL 10 MG PO TABS
10.0000 mg | ORAL_TABLET | Freq: Three times a day (TID) | ORAL | Status: DC
  Administered 2024-03-22 – 2024-03-26 (×12): 10 mg via ORAL
  Filled 2024-03-22 (×12): qty 1

## 2024-03-22 MED ORDER — BUDESON-GLYCOPYRROL-FORMOTEROL 160-9-4.8 MCG/ACT IN AERO
2.0000 | INHALATION_SPRAY | Freq: Two times a day (BID) | RESPIRATORY_TRACT | Status: DC
  Administered 2024-03-23 – 2024-03-26 (×6): 2 via RESPIRATORY_TRACT
  Filled 2024-03-22 (×2): qty 5.9

## 2024-03-22 MED ORDER — INSULIN ASPART 100 UNIT/ML IJ SOLN: 0.0000 [IU] | INTRAMUSCULAR | Status: DC

## 2024-03-22 MED ORDER — ASPIRIN 81 MG PO CHEW: 81.0000 mg | CHEWABLE_TABLET | ORAL | Status: DC

## 2024-03-22 MED ORDER — ONDANSETRON HCL 4 MG/2ML IJ SOLN
INTRAMUSCULAR | Status: AC
  Filled 2024-03-22: qty 2

## 2024-03-22 MED ORDER — TRAZODONE HCL 50 MG PO TABS
150.0000 mg | ORAL_TABLET | Freq: Every day | ORAL | Status: DC
  Administered 2024-03-22 – 2024-03-25 (×4): 150 mg via ORAL
  Filled 2024-03-22 (×4): qty 1

## 2024-03-22 MED ORDER — INSULIN LISPRO (1 UNIT DIAL) 100 UNIT/ML (KWIKPEN): 0.0000 [IU] | PEN_INJECTOR | Freq: Three times a day (TID) | SUBCUTANEOUS | Status: DC

## 2024-03-22 MED ORDER — INSULIN ASPART 100 UNIT/ML IJ SOLN
0.0000 [IU] | INTRAMUSCULAR | Status: DC
  Administered 2024-03-22 (×2): 7 [IU] via SUBCUTANEOUS
  Administered 2024-03-23: 1 [IU] via SUBCUTANEOUS
  Administered 2024-03-23: 7 [IU] via SUBCUTANEOUS
  Administered 2024-03-23: 3 [IU] via SUBCUTANEOUS
  Administered 2024-03-24 (×2): 1 [IU] via SUBCUTANEOUS
  Administered 2024-03-24: 3 [IU] via SUBCUTANEOUS
  Administered 2024-03-25: 2 [IU] via SUBCUTANEOUS
  Administered 2024-03-25: 5 [IU] via SUBCUTANEOUS
  Administered 2024-03-25 (×2): 2 [IU] via SUBCUTANEOUS
  Administered 2024-03-25: 5 [IU] via SUBCUTANEOUS
  Administered 2024-03-25: 3 [IU] via SUBCUTANEOUS
  Administered 2024-03-26: 1 [IU] via SUBCUTANEOUS
  Administered 2024-03-26: 2 [IU] via SUBCUTANEOUS

## 2024-03-22 MED ORDER — PREGABALIN 100 MG PO CAPS
100.0000 mg | ORAL_CAPSULE | Freq: Every day | ORAL | Status: DC
  Administered 2024-03-22 – 2024-03-26 (×4): 100 mg via ORAL
  Filled 2024-03-22 (×4): qty 1

## 2024-03-22 MED ORDER — ONDANSETRON HCL 4 MG/2ML IJ SOLN
4.0000 mg | Freq: Four times a day (QID) | INTRAMUSCULAR | Status: DC | PRN
  Administered 2024-03-22: 4 mg via INTRAVENOUS
  Filled 2024-03-22: qty 2

## 2024-03-22 MED ORDER — CALCIUM ACETATE (PHOS BINDER) 667 MG PO CAPS
1334.0000 mg | ORAL_CAPSULE | Freq: Three times a day (TID) | ORAL | Status: DC
  Administered 2024-03-22 – 2024-03-26 (×12): 1334 mg via ORAL
  Filled 2024-03-22 (×12): qty 2

## 2024-03-22 MED ORDER — TAMSULOSIN HCL 0.4 MG PO CAPS
0.4000 mg | ORAL_CAPSULE | Freq: Every day | ORAL | Status: DC
  Administered 2024-03-22 – 2024-03-26 (×4): 0.4 mg via ORAL
  Filled 2024-03-22 (×4): qty 1

## 2024-03-22 MED ORDER — AMLODIPINE BESYLATE 10 MG PO TABS
10.0000 mg | ORAL_TABLET | Freq: Every morning | ORAL | Status: DC
Start: 2024-03-22 — End: 2024-03-26
  Administered 2024-03-22 – 2024-03-26 (×4): 10 mg via ORAL
  Filled 2024-03-22 (×2): qty 1
  Filled 2024-03-22: qty 2
  Filled 2024-03-22: qty 1

## 2024-03-22 MED ORDER — SODIUM CHLORIDE 0.9 % IV SOLN
INTRAVENOUS | Status: DC
Start: 2024-03-23 — End: 2024-03-22

## 2024-03-22 MED ORDER — CARVEDILOL 12.5 MG PO TABS
12.5000 mg | ORAL_TABLET | Freq: Two times a day (BID) | ORAL | Status: DC
Start: 2024-03-22 — End: 2024-03-24
  Administered 2024-03-22 – 2024-03-23 (×3): 12.5 mg via ORAL
  Filled 2024-03-22 (×3): qty 1

## 2024-03-22 MED ORDER — HEPARIN SODIUM (PORCINE) 5000 UNIT/ML IJ SOLN
5000.0000 [IU] | Freq: Three times a day (TID) | INTRAMUSCULAR | Status: DC
  Administered 2024-03-22 – 2024-03-26 (×13): 5000 [IU] via SUBCUTANEOUS
  Filled 2024-03-22 (×13): qty 1

## 2024-03-22 MED ORDER — ASPIRIN 81 MG PO TBEC
81.0000 mg | DELAYED_RELEASE_TABLET | Freq: Every morning | ORAL | Status: DC
  Administered 2024-03-23 – 2024-03-26 (×3): 81 mg via ORAL
  Filled 2024-03-22 (×4): qty 1

## 2024-03-22 MED ORDER — DIAZEPAM 5 MG PO TABS: 5.0000 mg | ORAL_TABLET | Freq: Two times a day (BID) | ORAL | Status: DC | PRN

## 2024-03-22 MED ORDER — CLOPIDOGREL BISULFATE 75 MG PO TABS
75.0000 mg | ORAL_TABLET | Freq: Every day | ORAL | Status: DC
Start: 2024-03-22 — End: 2024-03-22
  Administered 2024-03-22: 75 mg via ORAL
  Filled 2024-03-22: qty 1

## 2024-03-22 MED ORDER — FLUTICASONE-UMECLIDIN-VILANT 100-62.5-25 MCG/ACT IN AEPB: INHALATION_SPRAY | Freq: Every day | RESPIRATORY_TRACT | Status: DC

## 2024-03-22 NOTE — ED Notes (Signed)
Pt off floor to echo.

## 2024-03-22 NOTE — Progress Notes (Signed)
  Echocardiogram 2D Echocardiogram has been performed.  Dione Franks 03/22/2024, 10:11 AM

## 2024-03-22 NOTE — Progress Notes (Addendum)
 Rounding Note    Patient Name: Jeffrey [name] Date of Encounter: 03/22/2024  [facility] Cardiologist: Wendie Hamburg, MD   Subjective   BP 158/75.  Potassium 4.2, magnesium  2.2.  Hemoglobin 7.7, platelets 120.  Reports intermittent chest pain.  Inpatient Medications    Scheduled Meds:  acetaminophen   650 mg Oral Q6H   amLODipine   10 mg Oral q morning   aspirin  EC  81 mg Oral q AM   atorvastatin   40 mg Oral q AM   budeson-glycopyrrolate -formoterol   2 puff Inhalation BID   calcium  acetate  1,334 mg Oral TID with meals   carvedilol   12.5 mg Oral BID WC   Chlorhexidine  Gluconate Cloth  6 each Topical Q0600   cyclobenzaprine   10 mg Oral TID   Fluticasone-Umeclidin-Vilant   Inhalation Daily   heparin   5,000 Units Subcutaneous Q8H   insulin  aspart  0-9 Units Subcutaneous Q4H   insulin  regular human CONCENTRATED  30 Units Subcutaneous BID WC   iron  polysaccharides  150 mg Oral Daily   nortriptyline   20 mg Oral QHS   pantoprazole   40 mg Oral QAC breakfast   pregabalin   100 mg Oral Daily   sertraline   100 mg Oral q AM   tamsulosin   0.4 mg Oral Daily   traZODone   150 mg Oral QHS   Continuous Infusions:  sodium chloride      PRN Meds: diazepam , HYDROcodone -acetaminophen , ondansetron  (ZOFRAN ) IV   Vital Signs    Vitals:   03/21/24 2146 03/22/24 0300 03/22/24 0440 03/22/24 0600  BP:  (!) 148/68  (!) 167/82  Pulse:  90  96  Resp:  13  17  Temp: 98.4 F (36.9 C)  98 F (36.7 C)   TempSrc: Oral  Oral   SpO2:  100%  100%  Weight:      Height:        Intake/Output Summary (Last 24 hours) at 03/22/2024 0818 Last data filed at 03/21/2024 2146 Gross per 24 hour  Intake 101.53 ml  Output --  Net 101.53 ml      03/21/2024   12:24 PM 03/21/2024    7:42 AM 03/14/2024    1:02 PM  Last 3 Weights  Weight (lbs) -- 292 lb --  Weight (kg) -- 132.45 kg --      Telemetry    NSR - Personally Reviewed  ECG    No new EKG - Personally Reviewed  Physical  Exam   GEN: No acute distress.   Neck: JVD difficult to assess given habitus Cardiac: RRR, no murmurs, rubs, or gallops.  Respiratory: Clear to auscultation bilaterally. GI: Soft, nontender MS: No edema; s/p L AKA Neuro:  Nonfocal  Psych: Normal affect   Labs    High Sensitivity Troponin:   Recent Labs  Lab 03/21/24 1458 03/21/24 1825  TROPONINIHS 19* 21*     Chemistry Recent Labs  Lab 03/21/24 1230 03/21/24 1458 03/22/24 0136 03/22/24 0438  NA 141  --  139 138  K 4.2  --  4.1 4.2  CL 104  --  100 99  CO2 23  --  26 24  GLUCOSE 127*  --  197* 222*  BUN 133*  --  94* 93*  CREATININE 7.20*  --  5.46* 5.69*  CALCIUM  6.6*  --  7.5* 7.5*  MG  --  1.5* 2.4 2.2  PROT  --   --   --  6.9  ALBUMIN  3.3*  --   --  3.4*  AST  --   --   --  14*  ALT  --   --   --  17  ALKPHOS  --   --   --  76  BILITOT  --   --   --  0.5  GFRNONAA 8*  --  11* 10*  ANIONGAP 14  --  13 15    Lipids No results for input(s): "CHOL", "TRIG", "HDL", "LABVLDL", "LDLCALC", "CHOLHDL" in the last 168 hours.  Hematology Recent Labs  Lab 03/21/24 1230 03/22/24 0136 03/22/24 0438  WBC 5.5 6.2 5.9  RBC 2.52* 2.67* 2.77*  HGB 7.2* 7.8* 7.7*  HCT 24.4* 25.7* 26.7*  MCV 96.8 96.3 96.4  MCH 28.6 29.2 27.8  MCHC 29.5* 30.4 28.8*  RDW 15.6* 15.5 15.3  PLT 127* 124* 120*   Thyroid No results for input(s): "TSH", "FREET4" in the last 168 hours.  BNPNo results for input(s): "BNP", "PROBNP" in the last 168 hours.  DDimer No results for input(s): "DDIMER" in the last 168 hours.   Radiology    DG Chest Port 1 View Result Date: 03/21/2024 CLINICAL DATA:  Chest pain. EXAM: PORTABLE CHEST 1 VIEW COMPARISON:  March 08, 2024. FINDINGS: Mild cardiomegaly is noted with mild central pulmonary vascular congestion. No consolidative process is noted. Status post right shoulder arthroplasty. IMPRESSION: Mild cardiomegaly with mild central pulmonary vascular congestion. Electronically Signed   By: Rosalene Colon  M.D.   On: 03/21/2024 16:24    Cardiac Studies     Patient Profile     63 y.o. male with COPD, chronic respiratory failure on 3 L O2, ESRD, PAD status post left AKA, chronic diastolic heart failure, hypertension who we are consulted for evaluation of VT   Assessment & Plan    Wide complex tachycardia: Reportedly lost consciousness during HD on 4/24, found to be in wide-complex tachycardia that lasted for ~10 seconds.  I reviewed telemetry with EP, felt likely artifact.  No further work-up recommended  CAD: He reports history of prior coronary stenting but I cannot find documentation of this.  Reports has been having chest pain but very atypical and reproducible with palpation.  - Check echo - Continue aspirin , statin - Continue carvedilol   ESRD: He was discharged from his dialysis center and has been coming to the ED for dialysis.  Not a good situation, needs to reestablish with regular HD.  Nephrology consulted  Hypomagnesemia: Magnesium  1.5 on admission.  He is on torsemide , was discontinued.  Magnesium  repleted, 2.2 this morning  Anemia: Likely due to renal disease, will monitor.  Hemoglobin 7.7 this morning  For questions or updates, please contact [facility] Please consult [email or web address] for contact info under        Signed, Wendie Hamburg, MD  03/22/2024, 8:18 AM

## 2024-03-22 NOTE — Progress Notes (Signed)
 Pt's referrals to [facility] and [facility] are currently pending. Additional info requested by [facility] this afternoon and navigator provided info for review. Additional info faxed to [facility] as well due to pt's current inpt admission. Spoke to pt via phone to make pt aware that referrals have been made and that referrals are pending at this time. Pt advised that navigator will f/u with pt once a determination is made by either provider whether pt remains inpt or d/c to home. Will assist as needed.   Lauraine Polite Renal Navigator [phone]
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
## 2024-03-22 NOTE — Progress Notes (Signed)
   03/22/24 2030  BiPAP/CPAP/SIPAP  Reason BIPAP/CPAP not in use Non-compliant (patient states "rather use oxygen in his nose, CPAP gets on his nerves; has CPAP at home and use it sometimes" postive reinforcement to encourage to use patient declines again. may use it upon request he states if he feels he struggle to breathe)  BiPAP/CPAP /SiPAP Vitals  Pulse Rate 95  Resp 18  SpO2 97 %

## 2024-03-22 NOTE — Consult Note (Signed)
 [facility] Renal Consultation Note    Indication for Consultation:  Management of ESRD/hemodialysis; anemia, hypertension/volume and secondary hyperparathyroidism PCP: Dr. Trellis Fries  HPI: Jeffrey [name] is a 63 y.o. male with ESRD on hemodialysis. He presented to hospital for regular HD. He doesn't have an OP center, dialyzes at hospital. He was admitted to hospital after code blue called in HD 03/21/2024 for wide complex tachycardia which converted after 15 seconds. Seen by Cardiology, arrhythmia most likely artifact. Patient awake and responsive after incident.   Seen in room. SR on monitor. He is eating lunch. Says he has apartment in [place]. Renal navigator attempting to help him to find OP HD center in [place]. Cardiology saw pt and has signed off. We will continue to manage and adjust hemodialysis. Next HD 03/23/2024.   Past Medical History:  Diagnosis Date   Altered mental status 05/04/2021   Arthritis    Blood transfusion without reported diagnosis    CHF (congestive heart failure) (HCC)    COPD (chronic obstructive pulmonary disease) (HCC)    Depression    Diabetes mellitus without complication (HCC)    type 2   Dyspnea    ESRD on hemodialysis (HCC)    Tues Thurs Sat   GERD (gastroesophageal reflux disease)    protonix    Heart murmur    as a child, no problems   Hypertension    Myocardial infarction (HCC)    PTSD (post-traumatic stress disorder)    Sepsis (HCC)    Sleep apnea    occasional uses CPAP   Uses powered wheelchair    Past Surgical History:  Procedure Laterality Date   A/V FISTULAGRAM N/A 08/30/2021   Procedure: A/V FISTULAGRAM;  Surgeon: Adine Hoof, MD;  Location: [facility] INVASIVE CV LAB;  Service: Cardiovascular;  Laterality: N/A;   A/V FISTULAGRAM Right 07/28/2022   Procedure: A/V Fistulagram;  Surgeon: Adine Hoof, MD;  Location: [facility] INVASIVE CV LAB;  Service: Cardiovascular;  Laterality: Right;   A/V FISTULAGRAM  Right 09/05/2022   Procedure: A/V Fistulagram;  Surgeon: Adine Hoof, MD;  Location: [facility] INVASIVE CV LAB;  Service: Cardiovascular;  Laterality: Right;   A/V FISTULAGRAM Right 10/31/2022   Procedure: A/V Fistulagram;  Surgeon: Adine Hoof, MD;  Location: [facility] INVASIVE CV LAB;  Service: Cardiovascular;  Laterality: Right;   A/V SHUNT INTERVENTION Right 10/31/2022   Procedure: A/V SHUNT INTERVENTION;  Surgeon: Adine Hoof, MD;  Location: [facility] INVASIVE CV LAB;  Service: Cardiovascular;  Laterality: Right;   A/V SHUNTOGRAM N/A 11/08/2021   Procedure: A/V SHUNTOGRAM;  Surgeon: Adine Hoof, MD;  Location: [facility] INVASIVE CV LAB;  Service: Cardiovascular;  Laterality: N/A;   AMPUTATION Left 04/03/2021   Procedure: LEFT ABOVE-THE-KNEE AMPUTATION;  Surgeon: Donnamarie Gables, MD;  Location: [facility] OR;  Service: Orthopedics;  Laterality: Left;   AV FISTULA PLACEMENT Right 08/12/2021   Procedure: INSERTION OF ARTERIOVENOUS (AV) GORE-TEX GRAFT RIGHT ARM;  Surgeon: Margherita Shell, MD;  Location: [facility] OR;  Service: Vascular;  Laterality: Right;   AV FISTULA PLACEMENT Left 04/07/2023   Procedure: ARTERIOVENOUS (AV) FISTULA GRAFT USING GORETEX STRETCH (4-7MM);  Surgeon: Dannis Dy, MD;  Location: [facility] OR;  Service: Vascular;  Laterality: Left;   INSERTION OF DIALYSIS CATHETER N/A 11/25/2022   Procedure: INSERTION OF DIALYSIS CATHETER;  Surgeon: Josiah Nigh, MD;  Location: [facility] ENDOSCOPY;  Service: Pulmonary;  Laterality: N/A;   INSERTION OF DIALYSIS CATHETER N/A 03/03/2023   Procedure: INSERTION OF TUNNELED DIALYSIS  CATHETER;  Surgeon: Adine Hoof, MD;  Location: [facility] OR;  Service: Vascular;  Laterality: N/A;   INSERTION OF DIALYSIS CATHETER Left 04/07/2023   Procedure: INSERTION OF DIALYSIS CATHETER USING PALINDROME  28CM PRECISION CHRONIC CATHETER KIT;  Surgeon: Dannis Dy, MD;  Location: [facility] OR;  Service: Vascular;  Laterality: Left;    INSERTION OF DIALYSIS CATHETER Left 07/18/2023   Procedure: INSERTION OF TUNNELED DIALYSIS CATHETER USING 23cm PALINDROME CHRONIC CATHETER;  Surgeon: Dannis Dy, MD;  Location: [facility] OR;  Service: Vascular;  Laterality: Left;   IR REMOVAL TUN CV CATH W/O FL  03/30/2021   JOINT REPLACEMENT     Bilateral knees   LIGATION ARTERIOVENOUS GORTEX GRAFT Right 03/03/2023   Procedure: LIGATION RIGHT ARM ARTERIOVENOUS GORTEX GRAFT;  Surgeon: Adine Hoof, MD;  Location: [facility] OR;  Service: Vascular;  Laterality: Right;   PERIPHERAL VASCULAR BALLOON ANGIOPLASTY Right 08/30/2021   Procedure: PERIPHERAL VASCULAR BALLOON ANGIOPLASTY;  Surgeon: Adine Hoof, MD;  Location: [facility] INVASIVE CV LAB;  Service: Cardiovascular;  Laterality: Right;   PERIPHERAL VASCULAR BALLOON ANGIOPLASTY Right 07/28/2022   Procedure: PERIPHERAL VASCULAR BALLOON ANGIOPLASTY;  Surgeon: Adine Hoof, MD;  Location: [facility] INVASIVE CV LAB;  Service: Cardiovascular;  Laterality: Right;  AVF   PERIPHERAL VASCULAR INTERVENTION  11/08/2021   Procedure: PERIPHERAL VASCULAR INTERVENTION;  Surgeon: Adine Hoof, MD;  Location: [facility] INVASIVE CV LAB;  Service: Cardiovascular;;  Central rt arm fistula   UPPER EXTREMITY VENOGRAPHY Right 08/10/2021   Procedure: UPPER EXTREMITY VENOGRAPHY;  Surgeon: Margherita Shell, MD;  Location: [facility] INVASIVE CV LAB;  Service: Cardiovascular;  Laterality: Right;   History reviewed. No pertinent family history. Social History:  reports that he quit smoking about 13 years ago. His smoking use included cigarettes. He has never used smokeless tobacco. He reports current alcohol  use. He reports that he does not currently use drugs. Allergies  Allergen Reactions   Actos [Pioglitazone] Anaphylaxis and Rash   Dexmedetomidine  Nausea And Vomiting and Other (See Comments)    (Precedex ) Dose-limiting bradycardia     Ibuprofen Shortness Of Breath, Swelling and Other (See Comments)     Pt tolerates aspirin     Tomato Anaphylaxis and Other (See Comments)    Only allergic to RAW tomatoes   Wellbutrin [Bupropion] Swelling   Prior to Admission medications   Medication Sig Start Date End Date Taking? Authorizing Provider  acetaminophen  (TYLENOL ) 325 MG tablet Take 2 tablets (650 mg total) by mouth every 6 (six) hours. 07/23/23  Yes Santa Cuba, MD  amLODipine  (NORVASC ) 10 MG tablet Take 10 mg by mouth every morning. 04/17/20  Yes [provider]  ASPIRIN  LOW DOSE 81 MG EC tablet Take 81 mg by mouth in the morning. 02/22/21  Yes [provider]  atorvastatin  (LIPITOR) 40 MG tablet Take 40 mg by mouth in the morning. 02/09/21  Yes [provider]  calcium  acetate (PHOSLO ) 667 MG capsule Take 2 capsules (1,334 mg total) by mouth with breakfast, with lunch, and with evening meal. 12/21/23  Yes Ejigiri, Juanda Noon, PA-C  carvedilol  (COREG ) 12.5 MG tablet Take 12.5 mg by mouth 2 (two) times daily with a meal. 04/17/20  Yes [provider]  clopidogrel  (PLAVIX ) 75 MG tablet Take 1 tablet (75 mg total) by mouth daily. 11/02/22  Yes Rhyne, Samantha J, PA-C  cyclobenzaprine  (FLEXERIL ) 10 MG tablet Take 10 mg by mouth 3 (three) times daily. 10/14/23  Yes [provider]  diazepam  (VALIUM ) 5 MG tablet Take  5 mg by mouth 2 (two) times daily as needed for muscle spasms. 12/09/21  Yes [provider]  insulin  lispro (HUMALOG  KWIKPEN) 100 UNIT/ML KwikPen Inject 0-9 Units into the skin 3 (three) times daily with meals. CBG < 70: treat low blood sugar CBG 70 - 120: 0 units  CBG 121 - 150: 1 unit  CBG 151 - 200: 2 units  CBG 201 - 250: 3 units  CBG 251 - 300: 5 units  CBG 301 - 350: 7 units  CBG 351 - 400: 9 units  CBG > 400: call MD 09/27/23  Yes Etter Hermann., MD  insulin  regular human CONCENTRATED (HUMULIN  R U-500 KWIKPEN) 500 UNIT/ML KwikPen Inject 55 Units into the skin 2 (two) times daily with a meal. Follow up with your PCP and  [facility] for additional adjustments 09/27/23 03/21/24 Yes Etter Hermann., MD  iron  polysaccharides (NIFEREX) 150 MG capsule Take 1 capsule (150 mg total) by mouth daily. 09/26/23  Yes Ozell Blunt, MD  nortriptyline  (PAMELOR ) 10 MG capsule Take 20 mg by mouth at bedtime.   Yes [provider]  [facility] VERIO test strip 1 each by Other route as needed. 03/04/24  Yes [provider]  pantoprazole  (PROTONIX ) 40 MG tablet Take 40 mg by mouth daily before breakfast. 02/09/21  Yes [provider]  pregabalin  (LYRICA ) 100 MG capsule Take 100 mg by mouth. 03/20/24 04/19/24 Yes [provider]  pregabalin  (LYRICA ) 75 MG capsule Take 1 capsule (75 mg total) by mouth See admin instructions. Daily.  Give after dialysis on dialysis days. Patient taking differently: Take 75 mg by mouth daily. 01/21/23  Yes Trish Furl, MD  sertraline  (ZOLOFT ) 100 MG tablet Take 100 mg by mouth in the morning. 02/22/21  Yes [provider]  tamsulosin  (FLOMAX ) 0.4 MG CAPS capsule Take 1 capsule (0.4 mg total) by mouth daily. 11/10/21  Yes Pahwani, Martha Slack, MD  torsemide  (DEMADEX ) 20 MG tablet Take 120 mg by mouth 2 (two) times daily.   Yes [provider]  traZODone  (DESYREL ) 150 MG tablet Take 150 mg by mouth at bedtime. 12/09/21  Yes [provider]  TRELEGY ELLIPTA 100-62.5-25 MCG/ACT AEPB Take 1 puff by mouth daily. 02/19/24  Yes [provider]  Fluticasone-Umeclidin-Vilant (TRELEGY ELLIPTA IN) Inhale 1 puff into the lungs daily. Patient not taking: Reported on 03/21/2024    [provider]  Vitamin D , Ergocalciferol , (DRISDOL ) 1.25 MG (50000 UNIT) CAPS capsule Take 1 capsule (50,000 Units total) by mouth every 7 (seven) days. Patient not taking: Reported on 03/21/2024 10/02/23   Ozell Blunt, MD   Current Facility-Administered Medications  Medication Dose Route Frequency Provider Last Rate Last Admin   0.9 %  sodium chloride  infusion    Intravenous Continuous Ganji, Jay, MD       acetaminophen  (TYLENOL ) tablet 650 mg  650 mg Oral Q6H Samtani, Jai-Gurmukh, MD   650 mg at 03/22/24 1100   amLODipine  (NORVASC ) tablet 10 mg  10 mg Oral q morning Samtani, Jai-Gurmukh, MD   10 mg at 03/22/24 1101   aspirin  EC tablet 81 mg  81 mg Oral q AM Samtani, Jai-Gurmukh, MD       atorvastatin  (LIPITOR) tablet 40 mg  40 mg Oral q AM Samtani, Jai-Gurmukh, MD   40 mg at 03/22/24 1101   budeson-glycopyrrolate -formoterol  (BREZTRI ) 160-9-4.8 MCG/ACT inhaler 2 puff  2 puff Inhalation BID Samtani, Jai-Gurmukh, MD       calcium  acetate (PHOSLO ) capsule  1,334 mg  1,334 mg Oral TID with meals Samtani, Jai-Gurmukh, MD   1,334 mg at 03/22/24 1101   carvedilol  (COREG ) tablet 12.5 mg  12.5 mg Oral BID WC Samtani, Jai-Gurmukh, MD   12.5 mg at 03/22/24 1610   Chlorhexidine  Gluconate Cloth 2 % PADS 6 each  6 each Topical Q0600 Stovall, Kathryn R, PA-C       cyclobenzaprine  (FLEXERIL ) tablet 10 mg  10 mg Oral TID Samtani, Jai-Gurmukh, MD   10 mg at 03/22/24 1100   diazepam  (VALIUM ) tablet 5 mg  5 mg Oral BID PRN Samtani, Jai-Gurmukh, MD       heparin  injection 5,000 Units  5,000 Units Subcutaneous Q8H Samtani, Jai-Gurmukh, MD   5,000 Units at 03/22/24 0550   HYDROcodone -acetaminophen  (NORCO/VICODIN) 5-325 MG per tablet 1 tablet  1 tablet Oral Q4H PRN Samtani, Jai-Gurmukh, MD   1 tablet at 03/22/24 9604   insulin  aspart (novoLOG ) injection 0-9 Units  0-9 Units Subcutaneous Q4H Samtani, Jai-Gurmukh, MD       insulin  regular human CONCENTRATED (HUMULIN  R) 500 UNIT/ML KwikPen 30 Units  30 Units Subcutaneous BID WC Samtani, Jai-Gurmukh, MD       iron  polysaccharides (NIFEREX) capsule 150 mg  150 mg Oral Daily Samtani, Jai-Gurmukh, MD       nortriptyline  (PAMELOR ) capsule 20 mg  20 mg Oral QHS Samtani, Jai-Gurmukh, MD       ondansetron  (ZOFRAN ) injection 4 mg  4 mg Intravenous Q6H PRN Arnulfo Larch, MD   4 mg at 03/22/24 5409   pantoprazole  (PROTONIX ) EC tablet 40 mg   40 mg Oral QAC breakfast Samtani, Jai-Gurmukh, MD   40 mg at 03/22/24 8119   pregabalin  (LYRICA ) capsule 100 mg  100 mg Oral Daily Samtani, Jai-Gurmukh, MD       sertraline  (ZOLOFT ) tablet 100 mg  100 mg Oral q AM Samtani, Jai-Gurmukh, MD   100 mg at 03/22/24 1100   tamsulosin  (FLOMAX ) capsule 0.4 mg  0.4 mg Oral Daily Samtani, Jai-Gurmukh, MD   0.4 mg at 03/22/24 1100   traZODone  (DESYREL ) tablet 150 mg  150 mg Oral QHS Samtani, Jai-Gurmukh, MD       Labs: Basic Metabolic Panel: Recent Labs  Lab 03/21/24 1230 03/22/24 0136 03/22/24 0438  NA 141 139 138  K 4.2 4.1 4.2  CL 104 100 99  CO2 23 26 24   GLUCOSE 127* 197* 222*  BUN 133* 94* 93*  CREATININE 7.20* 5.46* 5.69*  CALCIUM  6.6* 7.5* 7.5*  PHOS 7.3*  --   --    Liver Function Tests: Recent Labs  Lab 03/21/24 1230 03/22/24 0438  AST  --  14*  ALT  --  17  ALKPHOS  --  76  BILITOT  --  0.5  PROT  --  6.9  ALBUMIN  3.3* 3.4*   No results for input(s): "LIPASE", "AMYLASE" in the last 168 hours. No results for input(s): "AMMONIA" in the last 168 hours. CBC: Recent Labs  Lab 03/21/24 1230 03/22/24 0136 03/22/24 0438  WBC 5.5 6.2 5.9  HGB 7.2* 7.8* 7.7*  HCT 24.4* 25.7* 26.7*  MCV 96.8 96.3 96.4  PLT 127* 124* 120*   Cardiac Enzymes: No results for input(s): "CKTOTAL", "CKMB", "CKMBINDEX", "TROPONINI" in the last 168 hours. CBG: Recent Labs  Lab 03/22/24 0854 03/22/24 1407  GLUCAP 231* 295*   Iron  Studies: No results for input(s): "IRON ", "TIBC", "TRANSFERRIN", "FERRITIN" in the last 72 hours. Studies/Results: ECHOCARDIOGRAM COMPLETE Result Date: 03/22/2024    ECHOCARDIOGRAM REPORT  Patient Name:   Jeffrey [name] Date of Exam: 03/22/2024 Medical Rec #:  [id number]     Height:       65.0 in Accession #:    [id number]    Weight:       292.0 lb Date of Birth:  Jul 07, 1961     BSA:          2.323 m Patient Age:    62 years      BP:           158/75 mmHg Patient Gender: M             HR:           101 bpm. Exam  Location:  Inpatient Procedure: 2D Echo (Both Spectral and Color Flow Doppler were utilized during            procedure). Indications:    Ventricular tachycardia  History:        Patient has prior history of Echocardiogram examinations, most                 recent 09/17/2023. End stage renal disease and Pulmonary HTN;                 Risk Factors:Hypertension, Diabetes, Sleep Apnea and Former                 Smoker.  Sonographer:    Dione Franks RDCS Referring Phys: [id number] ANGELA NICOLE DUKE  Sonographer Comments: Patient is obese. Image acquisition challenging due to patient body habitus. IMPRESSIONS  1. Left ventricular ejection fraction, by estimation, is 60 to 65%. The left ventricle has normal function. The left ventricle has no regional wall motion abnormalities. There is mild concentric left ventricular hypertrophy. Left ventricular diastolic parameters are consistent with Grade II diastolic dysfunction (pseudonormalization).  2. Right ventricular systolic function is normal. The right ventricular size is normal. Tricuspid regurgitation signal is inadequate for assessing PA pressure.  3. Left atrial size was mildly dilated.  4. The mitral valve is normal in structure. No evidence of mitral valve regurgitation. No evidence of mitral stenosis.  5. The aortic valve is tricuspid. There is moderate calcification of the aortic valve. Aortic valve regurgitation is not visualized. Mild aortic valve stenosis. Aortic valve mean gradient measures 10.0 mmHg.  6. The inferior vena cava is dilated in size with <50% respiratory variability, suggesting right atrial pressure of 15 mmHg. FINDINGS  Left Ventricle: Left ventricular ejection fraction, by estimation, is 60 to 65%. The left ventricle has normal function. The left ventricle has no regional wall motion abnormalities. Definity  contrast agent was given IV to delineate the left ventricular  endocardial borders. The left ventricular internal cavity size was normal  in size. There is mild concentric left ventricular hypertrophy. Left ventricular diastolic parameters are consistent with Grade II diastolic dysfunction (pseudonormalization). Right Ventricle: The right ventricular size is normal. No increase in right ventricular wall thickness. Right ventricular systolic function is normal. Tricuspid regurgitation signal is inadequate for assessing PA pressure. Left Atrium: Left atrial size was mildly dilated. Right Atrium: Right atrial size was normal in size. Pericardium: There is no evidence of pericardial effusion. Mitral Valve: The mitral valve is normal in structure. Mild mitral annular calcification. No evidence of mitral valve regurgitation. No evidence of mitral valve stenosis. Tricuspid Valve: The tricuspid valve is normal in structure. Tricuspid valve regurgitation is not demonstrated. Aortic Valve: The aortic valve is tricuspid. There is moderate calcification of the aortic valve.  Aortic valve regurgitation is not visualized. Mild aortic stenosis is present. Aortic valve mean gradient measures 10.0 mmHg. Aortic valve peak gradient measures 18.1 mmHg. Aortic valve area, by VTI measures 2.67 cm. Pulmonic Valve: The pulmonic valve was normal in structure. Pulmonic valve regurgitation is not visualized. Aorta: The aortic root is normal in size and structure. Venous: The inferior vena cava is dilated in size with less than 50% respiratory variability, suggesting right atrial pressure of 15 mmHg. IAS/Shunts: No atrial level shunt detected by color flow Doppler.  LEFT VENTRICLE PLAX 2D LVIDd:         5.20 cm   Diastology LVIDs:         3.40 cm   LV e' medial:    10.70 cm/s LV PW:         1.20 cm   LV E/e' medial:  15.4 LV IVS:        1.20 cm   LV e' lateral:   12.90 cm/s LVOT diam:     2.20 cm   LV E/e' lateral: 12.8 LV SV:         98 LV SV Index:   42 LVOT Area:     3.80 cm  RIGHT VENTRICLE             IVC RV S prime:     15.70 cm/s  IVC diam: 2.60 cm TAPSE (M-mode): 2.3 cm  LEFT ATRIUM             Index LA diam:        4.50 cm 1.94 cm/m LA Vol (A2C):   67.8 ml 29.18 ml/m LA Vol (A4C):   81.2 ml 34.95 ml/m LA Biplane Vol: 81.3 ml 34.99 ml/m  AORTIC VALVE AV Area (Vmax):    2.48 cm AV Area (Vmean):   2.52 cm AV Area (VTI):     2.67 cm AV Vmax:           213.00 cm/s AV Vmean:          144.000 cm/s AV VTI:            0.367 m AV Peak Grad:      18.1 mmHg AV Mean Grad:      10.0 mmHg LVOT Vmax:         139.00 cm/s LVOT Vmean:        95.600 cm/s LVOT VTI:          0.258 m LVOT/AV VTI ratio: 0.70  AORTA Ao Root diam: 3.10 cm Ao Asc diam:  2.80 cm MITRAL VALVE MV Area (PHT): 3.60 cm     SHUNTS MV Decel Time: 211 msec     Systemic VTI:  0.26 m MV E velocity: 165.00 cm/s  Systemic Diam: 2.20 cm MV A velocity: 153.00 cm/s MV E/A ratio:  1.08 Dalton McleanMD Electronically signed by Archer Bear Signature Date/Time: 03/22/2024/10:23:33 AM    Final    DG Chest Port 1 View Result Date: 03/21/2024 CLINICAL DATA:  Chest pain. EXAM: PORTABLE CHEST 1 VIEW COMPARISON:  March 08, 2024. FINDINGS: Mild cardiomegaly is noted with mild central pulmonary vascular congestion. No consolidative process is noted. Status post right shoulder arthroplasty. IMPRESSION: Mild cardiomegaly with mild central pulmonary vascular congestion. Electronically Signed   By: Rosalene Colon M.D.   On: 03/21/2024 16:24    ROS: As per HPI otherwise negative.   Physical Exam: Vitals:   03/22/24 0800 03/22/24 0825 03/22/24 1100 03/22/24 1312  BP: (!) 158/75  Aaron Aas)  160/79   Pulse: (!) 105  95   Resp: 18  19 19   Temp:  98.7 F (37.1 C)  99.1 F (37.3 C)  TempSrc:  Oral  Oral  SpO2: 98%  100%   Weight:    132 kg  Height:    5\' 5"  (1.651 m)     General: Chronically ill appearing male in no acute distress. Head: Normocephalic, atraumatic, sclera non-icteric Neck: Supple. JVD not elevated. Lungs: Clear bilaterally to auscultation without wheezes, rales, or rhonchi. Breathing is unlabored. Heart: S1,S2 RRR  No M/R/G. SR on monitor.  Abdomen: Obese, NABS Lower extremities:L AKA, trace RLE edema  Neuro: Alert and oriented X 3. Moves all extremities spontaneously. Psych:  Responds to questions appropriately with a normal affect. Dialysis Access: AVG + T/B  Dialysis Orders: No OP Center 3.5 hours AVG 3.0 K/2.5 Ca   Assessment/Plan:  Arrhythmia: Seen by cardiology who reviewed rhythm strip with EP. Most likely artifact. Cards has signed off. Per primary  ESRD -  T,Th,S. HD tomorrow on schedule.   Hypertension/volume  - BP elevated. Continue home meds. CXR 04/24 mild central pulmonary vascular congestion. Lower volume as tolerated.   Anemia  - HGB 7.7. Check iron  panel with HD tomorrow. Cannot see that he's had recent ESA. Give with HD tomorrow.   Metabolic bone disease -  PO4 elevated. Continue binders, increase dose. Check PTH with HD tomorrow.   Nutrition - Low albumin . Add protein supplements.  DMT2-per primary  Zulma Hitt. Bevin Bucks, NP-C 03/22/2024, 2:51 PM  [employer] [phone]

## 2024-03-23 DIAGNOSIS — Z8249 Family history of ischemic heart disease and other diseases of the circulatory system: Secondary | ICD-10-CM | POA: Diagnosis not present

## 2024-03-23 LAB — RENAL FUNCTION PANEL
Albumin: 3.3 g/dL — ABNORMAL LOW (ref 3.5–5.0)
Anion gap: 14 (ref 5–15)
BUN: 108 mg/dL — ABNORMAL HIGH (ref 8–23)
CO2: 26 mmol/L (ref 22–32)
Calcium: 8 mg/dL — ABNORMAL LOW (ref 8.9–10.3)
Chloride: 97 mmol/L — ABNORMAL LOW (ref 98–111)
Creatinine, Ser: 6.19 mg/dL — ABNORMAL HIGH (ref 0.61–1.24)
GFR, Estimated: 9 mL/min — ABNORMAL LOW (ref >60)
Glucose, Bld: 124 mg/dL — ABNORMAL HIGH (ref 70–99)
Phosphorus: 7.2 mg/dL — ABNORMAL HIGH (ref 2.5–4.6)
Potassium: 4.9 mmol/L (ref 3.5–5.1)
Sodium: 137 mmol/L (ref 135–145)

## 2024-03-23 LAB — GLUCOSE, CAPILLARY
Glucose-Capillary: 144 mg/dL — ABNORMAL HIGH (ref 70–99)
Glucose-Capillary: 145 mg/dL — ABNORMAL HIGH (ref 70–99)
Glucose-Capillary: 231 mg/dL — ABNORMAL HIGH (ref 70–99)
Glucose-Capillary: 317 mg/dL — ABNORMAL HIGH (ref 70–99)
Glucose-Capillary: 53 mg/dL — ABNORMAL LOW (ref 70–99)
Glucose-Capillary: 57 mg/dL — ABNORMAL LOW (ref 70–99)
Glucose-Capillary: 99 mg/dL (ref 70–99)

## 2024-03-23 LAB — MAGNESIUM: Magnesium: 2.1 mg/dL (ref 1.7–2.4)

## 2024-03-23 LAB — HEPATITIS B CORE ANTIBODY, TOTAL: HEP B CORE AB: NEGATIVE

## 2024-03-23 LAB — MRSA NEXT GEN BY PCR, NASAL: MRSA by PCR Next Gen: DETECTED — AB

## 2024-03-23 MED ORDER — DEXTROSE 50 % IV SOLN
1.0000 | INTRAVENOUS | Status: DC | PRN
  Administered 2024-03-23: 50 mL via INTRAVENOUS
  Filled 2024-03-23: qty 50

## 2024-03-23 MED ORDER — GLUCOSE 40 % PO GEL
2.0000 | ORAL | Status: AC
  Administered 2024-03-23: 62 g via ORAL
  Filled 2024-03-23: qty 2.42

## 2024-03-23 NOTE — Progress Notes (Signed)
 Hypoglycemic Event  At 0419H, CBG: 53 Treatment: 2 tubes glucose gel Symptoms: None Follow-up CBG: Time:0503 CBG Result:57 Possible Reasons for Event: Unknown Comments/MD notified: Notified Dr Sundil  At [phone], follow-up CBG: 57 Treatment: D50 50 mL (25 gm) Symptoms: None Follow-up CBG: Time:0538 CBG Result:144 Possible Reasons for Event: Unknown Comments/MD notified: Notified Dr Ulice Gamer of follow up CBG and ordered for D50 50mL   Laura Radilla, RN

## 2024-03-23 NOTE — Plan of Care (Signed)
  Problem: Clinical Measurements: Goal: Will remain free from infection Outcome: Progressing Goal: Respiratory complications will improve Outcome: Progressing Goal: Cardiovascular complication will be avoided Outcome: Progressing   Problem: Activity: Goal: Risk for activity intolerance will decrease Outcome: Progressing   Problem: Pain Managment: Goal: General experience of comfort will improve and/or be controlled Outcome: Progressing   Problem: Safety: Goal: Ability to remain free from injury will improve Outcome: Progressing

## 2024-03-23 NOTE — Progress Notes (Signed)
   Patient Name: Jeffrey [name] Date of Encounter: 03/23/2024 [facility] Cardiologist: Wendie Hamburg, MD   Interval Summary  .    No new complaints. Gets dialysis at hospital since he has been DC'd from outpatient center.   On HD currently doing well.  Vital Signs .    Vitals:   03/23/24 0003 03/23/24 0313 03/23/24 0316 03/23/24 0637  BP: (!) 141/79  (!) 144/69   Pulse: 80 88 86 97  Resp: 11 12 12 18   Temp: 98.4 F (36.9 C)  98.8 F (37.1 C)   TempSrc: Oral  Axillary   SpO2: 96% 91% 90% 97%  Weight:      Height:        Intake/Output Summary (Last 24 hours) at 03/23/2024 0654 Last data filed at 03/22/2024 2200 Gross per 24 hour  Intake 1080 ml  Output 300 ml  Net 780 ml      03/22/2024    1:12 PM 03/21/2024   12:24 PM 03/21/2024    7:42 AM  Last 3 Weights  Weight (lbs) 291 lb 0.1 oz -- 292 lb  Weight (kg) 132 kg -- 132.45 kg      Telemetry/ECG    NSR - Personally Reviewed  Physical Exam .   GEN: No acute distress.   Neck: No JVD Cardiac: RRR, 2/6 SM, rubs, or gallops.  Respiratory: Clear to auscultation bilaterally. GI: Soft, nontender, non-distended  MS: No edema  Assessment & Plan .      63 y.o. male with COPD, chronic respiratory failure on 3 L O2, ESRD, PAD status post left AKA, chronic diastolic heart failure, hypertension who we are consulted for evaluation of VT   CAD  - reports prior stenting but no obtainable documentation of this  - ECHO normal EF reassuring  - Cont coreg , asa, statin  Mild AS  - mild aortic stenosis on ECHO. Should not be of clinical concern at this point.   Wide complex tachycardia  - reviewed with EP, prior note reviewed. Appears artifactual.. Lasting 10 sec. ECHO with normal EF.   - no further workup  ESRD  - torsemide  DCd with low mag. Mag improved.   Anemia  - renal dz likely cause  Will sign off, please reach out if any questions.   For questions or updates, please contact [facility] Please consult [email or web address] for contact info under        Signed, Dorothye Gathers, MD
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
## 2024-03-23 NOTE — Progress Notes (Signed)
   03/23/24 0313  BiPAP/CPAP/SIPAP  $ Non-Invasive Ventilator  Non-Invasive Vent Set Up  $ Non-Invasive Home Ventilator  Initial  $ Face Mask XL Yes  BiPAP/CPAP/SIPAP Pt Type Adult  BiPAP/CPAP/SIPAP Resmed  Mask Type Full face mask  Dentures removed? Not applicable  Mask Size Extra large  Respiratory Rate 12 breaths/min  Flow Rate 6.2 lpm  Leak 26  Tidal Volume (Vt) 533  Patient Home Machine No  Patient Home Mask No  Patient Home Tubing No  Auto Titrate Yes  Minimum cmH2O 10 cmH2O  Maximum cmH2O 20 cmH2O  BiPAP/CPAP /SiPAP Vitals  Pulse Rate 88  Resp 12  SpO2 91 %

## 2024-03-23 NOTE — Procedures (Signed)
 Patient was seen on dialysis and the procedure was supervised.  BFR 400  Via AVG BP is  150/84.   Patient appears to be tolerating treatment well.  Ok to [employer] from renal perspective. He comes to ER for HD until OP arrangement is made, Renal navigator is following.  Jeffrey [name] 03/23/2024

## 2024-03-23 NOTE — Plan of Care (Signed)
  Problem: Education: Goal: Understanding of CV disease, CV risk reduction, and recovery process will improve Outcome: Progressing   Problem: Activity: Goal: Ability to return to baseline activity level will improve Outcome: Progressing   Problem: Cardiovascular: Goal: Ability to achieve and maintain adequate cardiovascular perfusion will improve Outcome: Progressing   Problem: Education: Goal: Knowledge of General Education information will improve Description Including pain rating scale, medication(s)/side effects and non-pharmacologic comfort measures Outcome: Progressing

## 2024-03-24 DIAGNOSIS — Z8249 Family history of ischemic heart disease and other diseases of the circulatory system: Secondary | ICD-10-CM | POA: Diagnosis not present

## 2024-03-24 LAB — HEPATITIS B SURFACE ANTIGEN: Hepatitis B Surface Ag: NONREACTIVE

## 2024-03-24 LAB — GLUCOSE, CAPILLARY
Glucose-Capillary: 100 mg/dL — ABNORMAL HIGH (ref 70–99)
Glucose-Capillary: 118 mg/dL — ABNORMAL HIGH (ref 70–99)
Glucose-Capillary: 121 mg/dL — ABNORMAL HIGH (ref 70–99)
Glucose-Capillary: 142 mg/dL — ABNORMAL HIGH (ref 70–99)
Glucose-Capillary: 216 mg/dL — ABNORMAL HIGH (ref 70–99)
Glucose-Capillary: 79 mg/dL (ref 70–99)

## 2024-03-24 LAB — COMPREHENSIVE METABOLIC PANEL WITH GFR
ALT: 14 U/L (ref 0–44)
AST: 16 U/L (ref 15–41)
Albumin: 3.2 g/dL — ABNORMAL LOW (ref 3.5–5.0)
Alkaline Phosphatase: 68 U/L (ref 38–126)
Anion gap: 11 (ref 5–15)
BUN: 72 mg/dL — ABNORMAL HIGH (ref 8–23)
CO2: 28 mmol/L (ref 22–32)
Calcium: 8.2 mg/dL — ABNORMAL LOW (ref 8.9–10.3)
Chloride: 94 mmol/L — ABNORMAL LOW (ref 98–111)
Creatinine, Ser: 5.45 mg/dL — ABNORMAL HIGH (ref 0.61–1.24)
GFR, Estimated: 11 mL/min — ABNORMAL LOW (ref >60)
Glucose, Bld: 225 mg/dL — ABNORMAL HIGH (ref 70–99)
Potassium: 4.2 mmol/L (ref 3.5–5.1)
Sodium: 133 mmol/L — ABNORMAL LOW (ref 135–145)
Total Bilirubin: 1.1 mg/dL (ref 0.0–1.2)
Total Protein: 6.5 g/dL (ref 6.5–8.1)

## 2024-03-24 LAB — MAGNESIUM: Magnesium: 1.9 mg/dL (ref 1.7–2.4)

## 2024-03-24 MED ORDER — MAGNESIUM OXIDE -MG SUPPLEMENT 400 (240 MG) MG PO TABS
200.0000 mg | ORAL_TABLET | Freq: Once | ORAL | Status: AC
  Administered 2024-03-24: 200 mg via ORAL
  Filled 2024-03-24: qty 1

## 2024-03-24 MED ORDER — CARVEDILOL 25 MG PO TABS
25.0000 mg | ORAL_TABLET | Freq: Two times a day (BID) | ORAL | Status: DC
Start: 2024-03-24 — End: 2024-03-26
  Administered 2024-03-24 – 2024-03-26 (×4): 25 mg via ORAL
  Filled 2024-03-24 (×5): qty 1

## 2024-03-24 MED ORDER — CHLORHEXIDINE GLUCONATE CLOTH 2 % EX PADS
6.0000 | MEDICATED_PAD | Freq: Every day | CUTANEOUS | Status: DC
  Administered 2024-03-26: 6 via TOPICAL

## 2024-03-24 MED ORDER — FUROSEMIDE 10 MG/ML IJ SOLN
80.0000 mg | Freq: Once | INTRAMUSCULAR | Status: AC
  Administered 2024-03-24: 80 mg via INTRAVENOUS
  Filled 2024-03-24: qty 8

## 2024-03-24 NOTE — Progress Notes (Addendum)
 [facility] Progress Note   Subjective: Seen, pleasant. BP controlled. No events reported over night. He C/Os of being SOB. O2 Sats 95% on 4 LM but he is breathless when he talks. Only 2 liters removed in HD. He still makes urine.   Objective Vitals:   03/23/24 2322 03/24/24 0022 03/24/24 0100 03/24/24 0330  BP:  128/65  (!) 149/70  Pulse: 83 80 79 77  Resp: 15 18 (!) 21 15  Temp:  98.8 F (37.1 C)  98.3 F (36.8 C)  TempSrc:  Oral  Axillary  SpO2: 94% 98% 99% 95%  Weight:      Height:        Additional Objective Labs: Basic Metabolic Panel: Recent Labs  Lab 03/21/24 1230 03/22/24 0136 03/22/24 0438 03/23/24 1010  NA 141 139 138 137  K 4.2 4.1 4.2 4.9  CL 104 100 99 97*  CO2 23 26 24 26   GLUCOSE 127* 197* 222* 124*  BUN 133* 94* 93* 108*  CREATININE 7.20* 5.46* 5.69* 6.19*  CALCIUM  6.6* 7.5* 7.5* 8.0*  PHOS 7.3*  --   --  7.2*   Liver Function Tests: Recent Labs  Lab 03/21/24 1230 03/22/24 0438 03/23/24 1010  AST  --  14*  --   ALT  --  17  --   ALKPHOS  --  76  --   BILITOT  --  0.5  --   PROT  --  6.9  --   ALBUMIN  3.3* 3.4* 3.3*   No results for input(s): "LIPASE", "AMYLASE" in the last 168 hours. CBC: Recent Labs  Lab 03/21/24 1230 03/22/24 0136 03/22/24 0438  WBC 5.5 6.2 5.9  HGB 7.2* 7.8* 7.7*  HCT 24.4* 25.7* 26.7*  MCV 96.8 96.3 96.4  PLT 127* 124* 120*   Blood Culture    Component Value Date/Time   SDES BLOOD RIGHT ARM 07/18/2023 1116   SDES BLOOD RIGHT HAND 07/18/2023 1116   SPECREQUEST  07/18/2023 1116    BOTTLES DRAWN AEROBIC AND ANAEROBIC Blood Culture adequate volume   SPECREQUEST  07/18/2023 1116    BOTTLES DRAWN AEROBIC AND ANAEROBIC Blood Culture adequate volume   CULT  07/18/2023 1116    NO GROWTH 5 DAYS Performed at [facility], [address], [place], [place] [address]    CULT  07/18/2023 1116    NO GROWTH 5 DAYS Performed at [facility], [address], [place], [place] [address]     REPTSTATUS 07/23/2023 FINAL 07/18/2023 1116   REPTSTATUS 07/23/2023 FINAL 07/18/2023 1116    Cardiac Enzymes: No results for input(s): "CKTOTAL", "CKMB", "CKMBINDEX", "TROPONINI" in the last 168 hours. CBG: Recent Labs  Lab 03/23/24 1703 03/23/24 2038 03/24/24 0025 03/24/24 0327 03/24/24 0721  GLUCAP 317* 231* 121* 79 142*   Iron  Studies: No results for input(s): "IRON ", "TIBC", "TRANSFERRIN", "FERRITIN" in the last 72 hours. @lablastinr3 @ Studies/Results: ECHOCARDIOGRAM COMPLETE Result Date: 03/22/2024    ECHOCARDIOGRAM REPORT   Patient Name:   HASCAL GARROD Date of Exam: 03/22/2024 Medical Rec #:  [id number]     Height:       65.0 in Accession #:    [id number]    Weight:       292.0 lb Date of Birth:  December 26, 1960     BSA:          2.323 m Patient Age:    62 years      BP:  158/75 mmHg Patient Gender: M             HR:           101 bpm. Exam Location:  Inpatient Procedure: 2D Echo (Both Spectral and Color Flow Doppler were utilized during            procedure). Indications:    Ventricular tachycardia  History:        Patient has prior history of Echocardiogram examinations, most                 recent 09/17/2023. End stage renal disease and Pulmonary HTN;                 Risk Factors:Hypertension, Diabetes, Sleep Apnea and Former                 Smoker.  Sonographer:    Dione Franks RDCS Referring Phys: [id number] ANGELA NICOLE DUKE  Sonographer Comments: Patient is obese. Image acquisition challenging due to patient body habitus. IMPRESSIONS  1. Left ventricular ejection fraction, by estimation, is 60 to 65%. The left ventricle has normal function. The left ventricle has no regional wall motion abnormalities. There is mild concentric left ventricular hypertrophy. Left ventricular diastolic parameters are consistent with Grade II diastolic dysfunction (pseudonormalization).  2. Right ventricular systolic function is normal. The right ventricular size is normal. Tricuspid regurgitation  signal is inadequate for assessing PA pressure.  3. Left atrial size was mildly dilated.  4. The mitral valve is normal in structure. No evidence of mitral valve regurgitation. No evidence of mitral stenosis.  5. The aortic valve is tricuspid. There is moderate calcification of the aortic valve. Aortic valve regurgitation is not visualized. Mild aortic valve stenosis. Aortic valve mean gradient measures 10.0 mmHg.  6. The inferior vena cava is dilated in size with <50% respiratory variability, suggesting right atrial pressure of 15 mmHg. FINDINGS  Left Ventricle: Left ventricular ejection fraction, by estimation, is 60 to 65%. The left ventricle has normal function. The left ventricle has no regional wall motion abnormalities. Definity  contrast agent was given IV to delineate the left ventricular  endocardial borders. The left ventricular internal cavity size was normal in size. There is mild concentric left ventricular hypertrophy. Left ventricular diastolic parameters are consistent with Grade II diastolic dysfunction (pseudonormalization). Right Ventricle: The right ventricular size is normal. No increase in right ventricular wall thickness. Right ventricular systolic function is normal. Tricuspid regurgitation signal is inadequate for assessing PA pressure. Left Atrium: Left atrial size was mildly dilated. Right Atrium: Right atrial size was normal in size. Pericardium: There is no evidence of pericardial effusion. Mitral Valve: The mitral valve is normal in structure. Mild mitral annular calcification. No evidence of mitral valve regurgitation. No evidence of mitral valve stenosis. Tricuspid Valve: The tricuspid valve is normal in structure. Tricuspid valve regurgitation is not demonstrated. Aortic Valve: The aortic valve is tricuspid. There is moderate calcification of the aortic valve. Aortic valve regurgitation is not visualized. Mild aortic stenosis is present. Aortic valve mean gradient measures 10.0 mmHg.  Aortic valve peak gradient measures 18.1 mmHg. Aortic valve area, by VTI measures 2.67 cm. Pulmonic Valve: The pulmonic valve was normal in structure. Pulmonic valve regurgitation is not visualized. Aorta: The aortic root is normal in size and structure. Venous: The inferior vena cava is dilated in size with less than 50% respiratory variability, suggesting right atrial pressure of 15 mmHg. IAS/Shunts: No atrial level shunt detected by color flow Doppler.  LEFT VENTRICLE PLAX 2D LVIDd:         5.20 cm   Diastology LVIDs:         3.40 cm   LV e' medial:    10.70 cm/s LV PW:         1.20 cm   LV E/e' medial:  15.4 LV IVS:        1.20 cm   LV e' lateral:   12.90 cm/s LVOT diam:     2.20 cm   LV E/e' lateral: 12.8 LV SV:         98 LV SV Index:   42 LVOT Area:     3.80 cm  RIGHT VENTRICLE             IVC RV S prime:     15.70 cm/s  IVC diam: 2.60 cm TAPSE (M-mode): 2.3 cm LEFT ATRIUM             Index LA diam:        4.50 cm 1.94 cm/m LA Vol (A2C):   67.8 ml 29.18 ml/m LA Vol (A4C):   81.2 ml 34.95 ml/m LA Biplane Vol: 81.3 ml 34.99 ml/m  AORTIC VALVE AV Area (Vmax):    2.48 cm AV Area (Vmean):   2.52 cm AV Area (VTI):     2.67 cm AV Vmax:           213.00 cm/s AV Vmean:          144.000 cm/s AV VTI:            0.367 m AV Peak Grad:      18.1 mmHg AV Mean Grad:      10.0 mmHg LVOT Vmax:         139.00 cm/s LVOT Vmean:        95.600 cm/s LVOT VTI:          0.258 m LVOT/AV VTI ratio: 0.70  AORTA Ao Root diam: 3.10 cm Ao Asc diam:  2.80 cm MITRAL VALVE MV Area (PHT): 3.60 cm     SHUNTS MV Decel Time: 211 msec     Systemic VTI:  0.26 m MV E velocity: 165.00 cm/s  Systemic Diam: 2.20 cm MV A velocity: 153.00 cm/s MV E/A ratio:  1.08 Dalton McleanMD Electronically signed by Archer Bear Signature Date/Time: 03/22/2024/10:23:33 AM    Final    Medications:   acetaminophen   650 mg Oral Q6H   amLODipine   10 mg Oral q morning   aspirin  EC  81 mg Oral q AM   atorvastatin   40 mg Oral q AM    budeson-glycopyrrolate -formoterol   2 puff Inhalation BID   calcium  acetate  1,334 mg Oral TID with meals   carvedilol   12.5 mg Oral BID WC   Chlorhexidine  Gluconate Cloth  6 each Topical Q0600   cyclobenzaprine   10 mg Oral TID   heparin   5,000 Units Subcutaneous Q8H   insulin  aspart  0-9 Units Subcutaneous Q4H   insulin  regular human CONCENTRATED  55 Units Subcutaneous BID WC   iron  polysaccharides  150 mg Oral Daily   nortriptyline   20 mg Oral QHS   pantoprazole   40 mg Oral QAC breakfast   pregabalin   100 mg Oral Daily   sertraline   100 mg Oral q AM   tamsulosin   0.4 mg Oral Daily   traZODone   150 mg Oral QHS     General: Chronically ill appearing male in no acute distress. Head: Normocephalic, atraumatic, sclera non-icteric  Neck: Supple. JVD not elevated. Lungs:Few bibasilar crackles. DOE.  Heart: S1,S2 RRR No M/R/G. SR on monitor.  Abdomen: Obese, NABS Lower extremities:L AKA, trace RLE edema  Neuro: Alert and oriented X 3. Moves all extremities spontaneously. Psych:  Responds to questions appropriately with a normal affect. Dialysis Access: AVG + T/B   Dialysis Orders: No OP Center 3.5 hours AVG 3.0 K/2.5 Ca    Assessment/Plan:  Arrhythmia: Seen by cardiology who reviewed rhythm strip with EP. Most likely artifact. Cards has signed off. Per primary  Dyspnea at Rest-Possible volume overload. Give Furosemide  80 mg IV today. Hoping we can avoid extra HD  but will monitor closely.   ESRD -  T,Th,S. Next HD 03/26/2024  Hypertension/volume  - BP elevated. Continue home meds. CXR 04/24 mild central pulmonary vascular congestion. Lower volume as tolerated.   Anemia  - HGB 7.7. Check iron  panel with HD tomorrow. Cannot see that he's had recent ESA. Give with HD tomorrow.   Metabolic bone disease -  PO4 elevated. Continue binders, increase dose. Check PTH. He does not have clinic to manage BMD meds.  Nutrition - Low albumin . Add protein supplements.  DMT2-per primary  Zulma Hitt.  Eann Cleland NP-C 03/24/2024, 8:29 AM  [employer] [phone]

## 2024-03-24 NOTE — Plan of Care (Signed)
  Problem: Activity: Goal: Ability to return to baseline activity level will improve Outcome: Progressing   Problem: Cardiovascular: Goal: Ability to achieve and maintain adequate cardiovascular perfusion will improve Outcome: Progressing   Problem: Cardiovascular: Goal: Vascular access site(s) Level 0-1 will be maintained Outcome: Progressing   Problem: Education: Goal: Knowledge of General Education information will improve Description: Including pain rating scale, medication(s)/side effects and non-pharmacologic comfort measures Outcome: Progressing
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
## 2024-03-25 DIAGNOSIS — Z8249 Family history of ischemic heart disease and other diseases of the circulatory system: Secondary | ICD-10-CM | POA: Diagnosis not present

## 2024-03-25 LAB — CBC
HCT: 25.2 % — ABNORMAL LOW (ref 39.0–52.0)
Hemoglobin: 7.7 g/dL — ABNORMAL LOW (ref 13.0–17.0)
MCH: 28.5 pg (ref 26.0–34.0)
MCHC: 30.6 g/dL (ref 30.0–36.0)
MCV: 93.3 fL (ref 80.0–100.0)
Platelets: 122 10*3/uL — ABNORMAL LOW (ref 150–400)
RBC: 2.7 MIL/uL — ABNORMAL LOW (ref 4.22–5.81)
RDW: 14.9 % (ref 11.5–15.5)
WBC: 4.6 10*3/uL (ref 4.0–10.5)
nRBC: 0 % (ref 0.0–0.2)

## 2024-03-25 LAB — GLUCOSE, CAPILLARY
Glucose-Capillary: 167 mg/dL — ABNORMAL HIGH (ref 70–99)
Glucose-Capillary: 187 mg/dL — ABNORMAL HIGH (ref 70–99)
Glucose-Capillary: 236 mg/dL — ABNORMAL HIGH (ref 70–99)
Glucose-Capillary: 250 mg/dL — ABNORMAL HIGH (ref 70–99)
Glucose-Capillary: 278 mg/dL — ABNORMAL HIGH (ref 70–99)
Glucose-Capillary: 331 mg/dL — ABNORMAL HIGH (ref 70–99)

## 2024-03-25 LAB — COMPREHENSIVE METABOLIC PANEL WITH GFR
ALT: 13 U/L (ref 0–44)
AST: 13 U/L — ABNORMAL LOW (ref 15–41)
Albumin: 3.2 g/dL — ABNORMAL LOW (ref 3.5–5.0)
Alkaline Phosphatase: 74 U/L (ref 38–126)
Anion gap: 13 (ref 5–15)
BUN: 62 mg/dL — ABNORMAL HIGH (ref 8–23)
CO2: 27 mmol/L (ref 22–32)
Calcium: 8.4 mg/dL — ABNORMAL LOW (ref 8.9–10.3)
Chloride: 96 mmol/L — ABNORMAL LOW (ref 98–111)
Creatinine, Ser: 4.63 mg/dL — ABNORMAL HIGH (ref 0.61–1.24)
GFR, Estimated: 13 mL/min — ABNORMAL LOW (ref >60)
Glucose, Bld: 262 mg/dL — ABNORMAL HIGH (ref 70–99)
Potassium: 3.6 mmol/L (ref 3.5–5.1)
Sodium: 136 mmol/L (ref 135–145)
Total Bilirubin: 0.4 mg/dL (ref 0.0–1.2)
Total Protein: 7 g/dL (ref 6.5–8.1)

## 2024-03-25 LAB — HEPATITIS B SURFACE ANTIBODY, QUANTITATIVE: Hep B S AB Quant (Post): 26.3 m[IU]/mL

## 2024-03-25 LAB — MAGNESIUM: Magnesium: 2 mg/dL (ref 1.7–2.4)

## 2024-03-25 MED ORDER — HEPARIN SODIUM (PORCINE) 1000 UNIT/ML DIALYSIS
20.0000 [IU]/kg | INTRAMUSCULAR | Status: DC | PRN
  Administered 2024-03-25: 2700 [IU] via INTRAVENOUS_CENTRAL

## 2024-03-25 MED ORDER — MUPIROCIN 2 % EX OINT
1.0000 | TOPICAL_OINTMENT | Freq: Two times a day (BID) | CUTANEOUS | Status: DC
  Administered 2024-03-25 – 2024-03-26 (×3): 1 via NASAL
  Filled 2024-03-25: qty 22

## 2024-03-25 MED ORDER — LIDOCAINE HCL (PF) 1 % IJ SOLN: 5.0000 mL | INTRAMUSCULAR | Status: DC | PRN

## 2024-03-25 MED ORDER — HEPARIN SODIUM (PORCINE) 1000 UNIT/ML IJ SOLN
INTRAMUSCULAR | Status: AC
  Filled 2024-03-25: qty 3

## 2024-03-25 MED ORDER — HEPARIN SODIUM (PORCINE) 1000 UNIT/ML DIALYSIS: 1000.0000 [IU] | INTRAMUSCULAR | Status: DC | PRN

## 2024-03-25 MED ORDER — LIDOCAINE-PRILOCAINE 2.5-2.5 % EX CREA: 1.0000 | TOPICAL_CREAM | CUTANEOUS | Status: DC | PRN

## 2024-03-25 MED ORDER — ANTICOAGULANT SODIUM CITRATE 4% (200MG/5ML) IV SOLN: 5.0000 mL | Status: DC | PRN

## 2024-03-25 MED ORDER — PENTAFLUOROPROP-TETRAFLUOROETH EX AERO: 1.0000 | INHALATION_SPRAY | CUTANEOUS | Status: DC | PRN

## 2024-03-25 MED ORDER — POLYETHYLENE GLYCOL 3350 17 G PO PACK
17.0000 g | PACK | Freq: Every day | ORAL | Status: DC
  Administered 2024-03-25 – 2024-03-26 (×2): 17 g via ORAL
  Filled 2024-03-25 (×2): qty 1

## 2024-03-25 MED ORDER — ALTEPLASE 2 MG IJ SOLR: 2.0000 mg | Freq: Once | INTRAMUSCULAR | Status: DC | PRN

## 2024-03-25 MED ORDER — HYDROCODONE-ACETAMINOPHEN 5-325 MG PO TABS
ORAL_TABLET | ORAL | Status: AC
  Filled 2024-03-25: qty 1

## 2024-03-25 NOTE — Plan of Care (Signed)
   Problem: Nutrition: Goal: Adequate nutrition will be maintained Outcome: Adequate for Discharge

## 2024-03-25 NOTE — Progress Notes (Signed)
 Received patient in bed to unit.  Alert and oriented.  Informed consent signed and in chart.   TX duration: 3 hours  Patient tolerated well.  Transported back to the room  Alert, without acute distress.  Hand-off given to patient's nurse.   Access used: Left AV Graft upper arm Access issues: none  Total UF removed: 3L Medication(s) given: Hydrocodone    03/25/24 1149  Vitals  Temp 97.9 F (36.6 C)  Temp Source Oral  BP (!) 147/70  MAP (mmHg) 92  BP Location Right Leg  BP Method Automatic  Patient Position (if appropriate) Lying  Pulse Rate 75  Pulse Rate Source Monitor  ECG Heart Rate 76  Resp 11  Oxygen Therapy  SpO2 97 %  O2 Device Nasal Cannula  O2 Flow Rate (L/min) 4 L/min  During Treatment Monitoring  Duration of HD Treatment -hour(s) 3 hour(s)  HD Safety Checks Performed Yes  Intra-Hemodialysis Comments Tx completed  Dialysis Fluid Bolus Normal Saline  Bolus Amount (mL) 300 mL  Post Treatment  Dialyzer Clearance Clear  Liters Processed 63  Fluid Removed (mL) 3000 mL  Tolerated HD Treatment Yes  AVG/AVF Arterial Site Held (minutes) 7 minutes  AVG/AVF Venous Site Held (minutes) 7 minutes  Fistula / Graft Left Upper arm Arteriovenous vein graft  Placement Date/Time: (c) 04/22/23 (c) 1000   Orientation: Left  Access Location: Upper arm  Access Type: Arteriovenous vein graft  Status Deaccessed     Luciano Ruths LPN [facility]

## 2024-03-25 NOTE — Progress Notes (Signed)
 Subjective: Seen in hemodialysis room at start, of HD treatment, denies shortness of breath or chest pain.  Objective Vital signs in last 24 hours: Vitals:   03/25/24 1130 03/25/24 1149 03/25/24 1151 03/25/24 1155  BP: (!) 126/99 (!) 147/70  (!) 146/70  Pulse: 74 75  78  Resp: 11 11  13   Temp:  97.9 F (36.6 C)    TempSrc:  Oral    SpO2: 98% 97%  98%  Weight:   128.7 kg   Height:       Weight change:   Physical Exam: General: Obese chronically ill-appearing male NAD Heart: RRR no MRG Lungs: Faint bibasilar crackles, nonlabored breathing Abdomen: Obese, NABS soft NT ND Extremities: Hard woody right lower extremity edema, left AKA trace stump edema Dialysis Access: AVG patent on HD  Dialysis Orders: No OP Center 3.5 hours AVG 3.0 K/2.5 Ca     Problem/Plan Arrest and arrhythmia in the setting of torsade-severe hypomagnesemia 1.5 on admission, calcium  6.6 with EKG changes  Seen by cardiology no ischemic workup needed with troponin not elevated)(also Card reviewed rhythm strip with EP. Most likely artifact. Cards has signed off.  Plans per primary  Dyspnea at Rest-Possible volume overload. Give Furosemide  80 mg IV on admit.  HD now volume  ESRD -  T,Th,S.  Today and  HD 03/26/2024   Hypertension/volume  - BP elevated. Continue home meds. CXR 04/24 mild central pulmonary vascular congestion. Lower volume as tolerated.   Anemia  - HGB 7.7. Check iron  panel  Cannot see that he's had recent ESA. Give with HD    Metabolic bone disease -  PO4 elevated. Continue binders, increase dose. Check PTH. He does not have clinic to manage BMD meds.  Nutrition - Low albumin . Add protein supplements.  DMT2-per primary  Charlynne Coombes, PA-C [facility] Beeper [phone] 03/25/2024,1:18 PM  LOS: 4 days   Labs: Basic Metabolic Panel: Recent Labs  Lab 03/21/24 1230 03/22/24 0136 03/23/24 1010 03/24/24 1133 03/25/24 0735  NA 141   < > 137 133* 136  K 4.2   < > 4.9 4.2 3.6  CL  104   < > 97* 94* 96*  CO2 23   < > 26 28 27   GLUCOSE 127*   < > 124* 225* 262*  BUN 133*   < > 108* 72* 62*  CREATININE 7.20*   < > 6.19* 5.45* 4.63*  CALCIUM  6.6*   < > 8.0* 8.2* 8.4*  PHOS 7.3*  --  7.2*  --   --    < > = values in this interval not displayed.   Liver Function Tests: Recent Labs  Lab 03/22/24 0438 03/23/24 1010 03/24/24 1133 03/25/24 0735  AST 14*  --  16 13*  ALT 17  --  14 13  ALKPHOS 76  --  68 74  BILITOT 0.5  --  1.1 0.4  PROT 6.9  --  6.5 7.0  ALBUMIN  3.4* 3.3* 3.2* 3.2*   No results for input(s): "LIPASE", "AMYLASE" in the last 168 hours. No results for input(s): "AMMONIA" in the last 168 hours. CBC: Recent Labs  Lab 03/21/24 1230 03/22/24 0136 03/22/24 0438 03/25/24 0735  WBC 5.5 6.2 5.9 4.6  HGB 7.2* 7.8* 7.7* 7.7*  HCT 24.4* 25.7* 26.7* 25.2*  MCV 96.8 96.3 96.4 93.3  PLT 127* 124* 120* 122*   Cardiac Enzymes: No results for input(s): "CKTOTAL", "CKMB", "CKMBINDEX", "TROPONINI" in the last 168 hours. CBG: Recent Labs  Lab 03/24/24  2048 03/25/24 0025 03/25/24 0417 03/25/24 0734 03/25/24 1221  GLUCAP 100* 167* 236* 250* 187*    Studies/Results: No results found. Medications:   acetaminophen   650 mg Oral Q6H   amLODipine   10 mg Oral q morning   aspirin  EC  81 mg Oral q AM   atorvastatin   40 mg Oral q AM   budeson-glycopyrrolate -formoterol   2 puff Inhalation BID   calcium  acetate  1,334 mg Oral TID with meals   carvedilol   25 mg Oral BID WC   Chlorhexidine  Gluconate Cloth  6 each Topical Q0600   Chlorhexidine  Gluconate Cloth  6 each Topical Q0600   cyclobenzaprine   10 mg Oral TID   heparin   5,000 Units Subcutaneous Q8H   insulin  aspart  0-9 Units Subcutaneous Q4H   insulin  regular human CONCENTRATED  55 Units Subcutaneous BID WC   iron  polysaccharides  150 mg Oral Daily   nortriptyline   20 mg Oral QHS   pantoprazole   40 mg Oral QAC breakfast   pregabalin   100 mg Oral Daily   sertraline   100 mg Oral q AM   tamsulosin    0.4 mg Oral Daily   traZODone   150 mg Oral QHS
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
## 2024-03-25 NOTE — Plan of Care (Signed)
  Problem: Education: Goal: Understanding of CV disease, CV risk reduction, and recovery process will improve Outcome: Progressing Goal: Individualized Educational Video(s) Outcome: Progressing   Problem: Activity: Goal: Ability to return to baseline activity level will improve Outcome: Progressing   Problem: Cardiovascular: Goal: Ability to achieve and maintain adequate cardiovascular perfusion will improve Outcome: Progressing Goal: Vascular access site(s) Level 0-1 will be maintained Outcome: Progressing   Problem: Health Behavior/Discharge Planning: Goal: Ability to safely manage health-related needs after discharge will improve Outcome: Progressing   Problem: Education: Goal: Knowledge of General Education information will improve Description: Including pain rating scale, medication(s)/side effects and non-pharmacologic comfort measures Outcome: Progressing   Problem: Health Behavior/Discharge Planning: Goal: Ability to manage health-related needs will improve Outcome: Progressing   Problem: Clinical Measurements: Goal: Ability to maintain clinical measurements within normal limits will improve Outcome: Progressing Goal: Will remain free from infection Outcome: Progressing Goal: Diagnostic test results will improve Outcome: Progressing Goal: Respiratory complications will improve Outcome: Progressing Goal: Cardiovascular complication will be avoided Outcome: Progressing   Problem: Activity: Goal: Risk for activity intolerance will decrease Outcome: Progressing   Problem: Nutrition: Goal: Adequate nutrition will be maintained Outcome: Progressing   Problem: Coping: Goal: Level of anxiety will decrease Outcome: Progressing   Problem: Pain Managment: Goal: General experience of comfort will improve and/or be controlled Outcome: Progressing   Problem: Safety: Goal: Ability to remain free from injury will improve Outcome: Progressing   Problem: Skin  Integrity: Goal: Risk for impaired skin integrity will decrease Outcome: Progressing

## 2024-03-25 NOTE — Progress Notes (Addendum)
 Received notification this morning that pt is an area denial for [facility] for the [facility] area. Contacted [facility] admissions and awaiting return call regarding pending referral for [facility] in the [place] area. Will contact renal network CSW with update as well to see if any assistance can be provided by the renal network with placement in the [place] area. Attending and nephrologist advised this morning that pt currently does not have an accepting nephrologist or clinic in the [place] or [place] area. Will assist as needed.   Lauraine Polite Renal Navigator [phone]  Addendum at 3:57 pm: Spoke to [facility] admissions who states pt's referral for a [facility] clinic is still pending. Awaiting final determination. Update provided to attending and nephrologist.   Addendum at 4:39 pm: Spoke to pt via phone to provide update on above info.

## 2024-03-25 NOTE — TOC Initial Note (Signed)
 Transition of Care [facility]) - Initial/Assessment Note    Patient Details  Name: Jeffrey [name] MRN: [id number] Date of Birth: Sep 19, 1961  Transition of Care [facility]) CM/SW Contact:    Cosimo Diones, RN Phone Number: 03/25/2024, 4:23 PM  Clinical Narrative:   Patient presented for dyspnea. PTA patient states he lives in [place] in an apartment. Patient has DME electric wheelchair in the room. Patient states he will need assistance with transportation once he gets Clip for Outpatient HD-CSW is aware. Case Manager will continue to follow for additional transition of care needs as the patient progresses.                  Expected Discharge Plan: Home/Self Care Barriers to Discharge: Continued Medical Work up (Awaiting CLIP for Outpatient HD)   Patient Goals and CMS Choice Patient states their goals for this hospitalization and ongoing recovery are:: plan to return home to his apartment   Expected Discharge Plan and Services In-house Referral: NA Discharge Planning Services: CM Consult   Living arrangements for the past 2 months: Apartment  Prior Living Arrangements/Services Living arrangements for the past 2 months: Apartment Lives with:: Self Patient language and need for interpreter reviewed:: Yes Do you feel safe going back to the place where you live?: Yes          Current home services: DME (electric wheelchair) Criminal Activity/Legal Involvement Pertinent to Current Situation/Hospitalization: No - Comment as needed   Permission Sought/Granted Permission sought to share information with : Case Manager   Emotional Assessment Appearance:: Appears stated age Attitude/Demeanor/Rapport: Engaged Affect (typically observed): Appropriate   Alcohol  / Substance Use: Not Applicable Psych Involvement: No (comment)  Admission diagnosis:  Ventricular tachycardia (HCC) [I47.20] ESRD (end stage renal disease) (HCC) [N18.6] FHx: SVT (supraventricular tachycardia)  [Z82.49] Patient Active Problem List   Diagnosis Date Noted   FHx: SVT (supraventricular tachycardia) 03/21/2024   Wide-complex tachycardia 03/21/2024   Hyperosmolar hyperglycemic state (HHS) (HCC) 09/17/2023   Chest pain of uncertain etiology 09/16/2023   Visual field defect of right eye 07/20/2023   Anemia due to chronic kidney disease, on chronic dialysis (HCC) 07/19/2023   Hyperuricemia 07/19/2023   Left arm pain 07/17/2023   Shortness of breath 06/01/2023   Hyperglycemia due to diabetes mellitus (HCC) 04/06/2023   Accelerated hypertension 04/06/2023   ESRD on dialysis (HCC) 03/03/2023   Acute on chronic respiratory failure with hypoxia (HCC) 11/24/2022   Uremia 11/24/2022   Encounter for removal of sutures 07/15/2022   Stress reaction of bone 07/12/2022   Benign prostatic hyperplasia with weak urinary stream 12/30/2021   Swelling of right upper extremity 11/07/2021   S/P reverse total shoulder arthroplasty, right 10/27/2021   AV graft malfunction (HCC) 09/01/2021   Dialysis AV fistula malfunction, initial encounter (HCC) 08/30/2021   Mood disorder (HCC) 08/30/2021   Disorder of ligament, right ankle 08/03/2021   Pain in right ankle and joints of right foot 08/03/2021   Local infection due to central venous catheter, sequela 07/21/2021   Acquired absence of left leg below knee (HCC) 06/21/2021   S/P AKA (above knee amputation), left (HCC) 05/04/2021   Bacteremia 04/20/2021   Other specified joint disorders, left knee 04/20/2021   Iron  deficiency anemia, unspecified 04/13/2021   Anemia    Infection of prosthetic left knee joint (HCC)    MRSA bacteremia 03/30/2021   Left knee pain    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    Fever 03/29/2021   End stage  renal disease (HCC) 03/29/2021   COPD (chronic obstructive pulmonary disease) (HCC) 03/29/2021   Type 2 diabetes mellitus with hyperlipidemia (HCC) 03/29/2021   PTSD (post-traumatic stress disorder)  03/29/2021   Dyspnea, unspecified 10/12/2020   Pain in arm, unspecified 10/12/2020   Mild protein-calorie malnutrition (HCC) 09/06/2020   Status post cervical spinal fusion 08/31/2020   Controlled substance agreement signed 08/30/2020   Chronic pain 08/20/2020   Secondary hyperparathyroidism of renal origin (HCC) 08/15/2020   Allergy, unspecified, sequela 07/21/2020   Coagulation defect, unspecified (HCC) 07/21/2020   Encounter for immunization 07/21/2020   Personal history of anaphylaxis 07/21/2020   Cervical myelopathy (HCC) 07/03/2020   Bilateral bunions 05/08/2020   Hammer toes of both feet 05/08/2020   Onychomycosis of toenail 05/08/2020   Muscle cramps 05/07/2020   History of 2019 novel coronavirus disease (COVID-19) 11/06/2019   Cervical radiculopathy 10/28/2019   Volume overload 06/25/2019   Degenerative disc disease, cervical 04/02/2019   History of MRSA infection 10/04/2018   Spinal stenosis of lumbosacral region 08/28/2018   Primary osteoarthritis of both shoulders 02/12/2018   Rotator cuff arthropathy of both shoulders 02/12/2018   Lumbar degenerative disc disease 01/25/2018   Chronic midline low back pain without sciatica 12/15/2017   History of colon polyps 08/15/2016   Mild episode of recurrent major depressive disorder (HCC) 05/30/2016   Vitamin D  deficiency 04/18/2016   Chronic insomnia 12/21/2015   Pulmonary hypertension, mild (HCC) 04/29/2014   Chronic gastritis without bleeding 01/13/2014   Diabetic polyneuropathy associated with type 2 diabetes mellitus (HCC) 01/13/2014   Chronic diastolic heart failure (HCC) 12/24/2013   OSA (obstructive sleep apnea) 12/24/2013   Primary osteoarthritis of both knees 08/26/2013   Primary osteoarthritis of right knee 08/26/2013   Status post total bilateral knee replacement 05/02/2013   Mixed hyperlipidemia 07/30/2012   Class 3 severe obesity due to excess calories with serious comorbidity and body mass index (BMI) of 45.0  to 49.9 in adult (HCC) 07/27/2012   Gastroesophageal reflux disease without esophagitis 05/12/2008   Erectile dysfunction due to diseases classified elsewhere 01/19/2005   PCP:  Trellis Fries, MD Pharmacy:   [id number] Jonette Nestle, [place] - [address] AT [facility] [address] [place] [address] Phone: [phone] Fax: [phone]  [facility] [address] [place] [address] Phone: [phone] Fax: [phone]     Social Drivers of Health (SDOH) Social History: SDOH Screenings   Food Insecurity: No Food Insecurity (11/10/2023)   Received from [facility]  Housing: High Risk (01/08/2024)   Received from [facility]  Transportation Needs: No Transportation Needs (11/10/2023)   Received from [facility]  Utilities: Not At Risk (11/10/2023)   Received from [facility]  Financial Resource Strain: Medium Risk (11/10/2023)   Received from [facility]  Physical Activity: Inactive (09/08/2021)   Received from [facility], [facility]  Social Connections: Moderately Isolated (09/07/2022)   Received from [facility], [facility]  Stress: Stress Concern Present (09/07/2022)   Received from [facility], [facility]  Tobacco Use: Medium Risk (03/21/2024)   Readmission Risk Interventions    06/02/2023    4:31 PM 04/24/2023    4:36 PM 11/03/2022   10:19 AM  Readmission Risk Prevention Plan  Transportation Screening Complete Complete Complete  Medication Review (RN Care  Manager) Complete Referral to Pharmacy Complete  PCP or Specialist appointment within 3-5 days of discharge Complete Complete --  HRI or Home Care Consult Complete Complete Complete  SW Recovery Care/Counseling Consult Complete Complete  Complete  Palliative Care Screening Not Applicable Not Applicable Not Applicable  Skilled Nursing Facility Not Applicable Not Applicable Not Applicable

## 2024-03-26 ENCOUNTER — Other Ambulatory Visit: Payer: Self-pay | Admitting: Nephrology

## 2024-03-26 ENCOUNTER — Other Ambulatory Visit (HOSPITAL_COMMUNITY): Payer: Self-pay

## 2024-03-26 DIAGNOSIS — Z8249 Family history of ischemic heart disease and other diseases of the circulatory system: Secondary | ICD-10-CM | POA: Diagnosis not present

## 2024-03-26 LAB — PARATHYROID HORMONE, INTACT (NO CA): PTH: 161 pg/mL — ABNORMAL HIGH (ref 15–65)

## 2024-03-26 LAB — GLUCOSE, CAPILLARY
Glucose-Capillary: 113 mg/dL — ABNORMAL HIGH (ref 70–99)
Glucose-Capillary: 129 mg/dL — ABNORMAL HIGH (ref 70–99)
Glucose-Capillary: 133 mg/dL — ABNORMAL HIGH (ref 70–99)
Glucose-Capillary: 174 mg/dL — ABNORMAL HIGH (ref 70–99)

## 2024-03-26 LAB — CBC
HCT: 25.2 % — ABNORMAL LOW (ref 39.0–52.0)
Hemoglobin: 7.8 g/dL — ABNORMAL LOW (ref 13.0–17.0)
MCH: 28.5 pg (ref 26.0–34.0)
MCHC: 31 g/dL (ref 30.0–36.0)
MCV: 92 fL (ref 80.0–100.0)
Platelets: 115 10*3/uL — ABNORMAL LOW (ref 150–400)
RBC: 2.74 MIL/uL — ABNORMAL LOW (ref 4.22–5.81)
RDW: 15 % (ref 11.5–15.5)
WBC: 5.6 10*3/uL (ref 4.0–10.5)
nRBC: 0 % (ref 0.0–0.2)

## 2024-03-26 LAB — RENAL FUNCTION PANEL
Albumin: 3.2 g/dL — ABNORMAL LOW (ref 3.5–5.0)
Anion gap: 13 (ref 5–15)
BUN: 62 mg/dL — ABNORMAL HIGH (ref 8–23)
CO2: 28 mmol/L (ref 22–32)
Calcium: 8.7 mg/dL — ABNORMAL LOW (ref 8.9–10.3)
Chloride: 96 mmol/L — ABNORMAL LOW (ref 98–111)
Creatinine, Ser: 5.47 mg/dL — ABNORMAL HIGH (ref 0.61–1.24)
GFR, Estimated: 11 mL/min — ABNORMAL LOW (ref >60)
Glucose, Bld: 111 mg/dL — ABNORMAL HIGH (ref 70–99)
Phosphorus: 4.4 mg/dL (ref 2.5–4.6)
Potassium: 3.7 mmol/L (ref 3.5–5.1)
Sodium: 137 mmol/L (ref 135–145)

## 2024-03-26 MED ORDER — CARVEDILOL 25 MG PO TABS
25.0000 mg | ORAL_TABLET | Freq: Two times a day (BID) | ORAL | 1 refills | Status: DC
  Filled 2024-03-26: qty 60, 30d supply, fill #0

## 2024-03-26 MED ORDER — HYDROCODONE-ACETAMINOPHEN 5-325 MG PO TABS
1.0000 | ORAL_TABLET | ORAL | 0 refills | Status: DC | PRN
Start: 2024-03-26 — End: 2024-04-16
  Filled 2024-03-26: qty 30, 7d supply, fill #0

## 2024-03-26 NOTE — Progress Notes (Signed)
 Subjective: Patient had dialysis yesterday 1.8 L UF, today is his normal day with recently missed treatment short treatment today.  Currently denies shortness of breath or chest pain  Objective Vital signs in last 24 hours: Vitals:   03/26/24 0930 03/26/24 1000 03/26/24 1046 03/26/24 1047  BP: 129/89 (!) 153/142 (!) 172/128 (!) 153/61  Pulse: 73  78 75  Resp: 13 16 18 11   Temp:      TempSrc:      SpO2: 99%  100% 99%  Weight:      Height:       Weight change:   Physical Exam: General: Obese chronically ill-appearing male NAD Heart: RRR no MRG Lungs: Faint bibasilar crackles, nonlabored breathing Abdomen: Obese, NABS soft NT ND Extremities: Hard woody right lower extremity edema, left AKA trace stump edema Dialysis Access: AVG positive bruit about to start dialysis in room.   Dialysis Orders: No OP Center 3.5 hours AVG 3.0 K/2.5 Ca      Problem/Plan Arrest and arrhythmia in the setting of torsade-severe hypomagnesemia 1.5 on admission, calcium  6.6 with EKG changes  Seen by cardiology no ischemic workup needed with troponin not elevated)(also Card reviewed rhythm strip with EP. Most likely artifact. Cards has signed off.  Plans per primary  Dyspnea at Rest-Possible volume overload.   HD yesterday and today on schedule, now volume stable UF as able today  ESRD -  T,Th,S.  HD usually(as he does not have an outpatient center) HD today to keep on schedule short time today  Hypertension/volume  -on admit BP elevated.  Removed with UF and home meds. CXR 04/24 mild central pulmonary vascular congestion. Lower volume as tolerated.   Anemia  - HGB 7.8. Check iron  panel  Cannot see that he's had recent ESA.  Aranesp  200 ordered for 4 /24   Metabolic bone disease -  PO 4.4 continue binders,PTH ordered on 4/27 still pending. He does not have clinic to manage BMD meds.  Nutrition - Low albumin . Add protein supplements.  DMT2-per primary  Noted will be discharged today and is noted =Needs  magnesium  checked every time patient comes for dialysis-has risk of hypomagnesemia and arrhythmia and had V. tach/arrest on admission 03/21/2024    Charlynne Coombes, PA-C [facility] [phone] 03/26/2024,12:18 PM  LOS: 5 days   Labs: Basic Metabolic Panel: Recent Labs  Lab 03/21/24 1230 03/22/24 0136 03/23/24 1010 03/24/24 1133 03/25/24 0735 03/26/24 0814  NA 141   < > 137 133* 136 137  K 4.2   < > 4.9 4.2 3.6 3.7  CL 104   < > 97* 94* 96* 96*  CO2 23   < > 26 28 27 28   GLUCOSE 127*   < > 124* 225* 262* 111*  BUN 133*   < > 108* 72* 62* 62*  CREATININE 7.20*   < > 6.19* 5.45* 4.63* 5.47*  CALCIUM  6.6*   < > 8.0* 8.2* 8.4* 8.7*  PHOS 7.3*  --  7.2*  --   --  4.4   < > = values in this interval not displayed.   Liver Function Tests: Recent Labs  Lab 03/22/24 0438 03/23/24 1010 03/24/24 1133 03/25/24 0735 03/26/24 0814  AST 14*  --  16 13*  --   ALT 17  --  14 13  --   ALKPHOS 76  --  68 74  --   BILITOT 0.5  --  1.1 0.4  --   PROT 6.9  --  6.5 7.0  --   ALBUMIN  3.4*   < > 3.2* 3.2* 3.2*   < > = values in this interval not displayed.   No results for input(s): "LIPASE", "AMYLASE" in the last 168 hours. No results for input(s): "AMMONIA" in the last 168 hours. CBC: Recent Labs  Lab 03/21/24 1230 03/22/24 0136 03/22/24 0438 03/25/24 0735 03/26/24 0814  WBC 5.5 6.2 5.9 4.6 5.6  HGB 7.2* 7.8* 7.7* 7.7* 7.8*  HCT 24.4* 25.7* 26.7* 25.2* 25.2*  MCV 96.8 96.3 96.4 93.3 92.0  PLT 127* 124* 120* 122* 115*   Cardiac Enzymes: No results for input(s): "CKTOTAL", "CKMB", "CKMBINDEX", "TROPONINI" in the last 168 hours. CBG: Recent Labs  Lab 03/25/24 2120 03/26/24 0029 03/26/24 0434 03/26/24 0733 03/26/24 1129  GLUCAP 278* 174* 133* 113* 129*    Studies/Results: No results found. Medications:   acetaminophen   650 mg Oral Q6H   amLODipine   10 mg Oral q morning   aspirin  EC  81 mg Oral q AM   atorvastatin   40 mg Oral q AM    budeson-glycopyrrolate -formoterol   2 puff Inhalation BID   calcium  acetate  1,334 mg Oral TID with meals   carvedilol   25 mg Oral BID WC   Chlorhexidine  Gluconate Cloth  6 each Topical Q0600   Chlorhexidine  Gluconate Cloth  6 each Topical Q0600   cyclobenzaprine   10 mg Oral TID   heparin   5,000 Units Subcutaneous Q8H   insulin  aspart  0-9 Units Subcutaneous Q4H   insulin  regular human CONCENTRATED  55 Units Subcutaneous BID WC   iron  polysaccharides  150 mg Oral Daily   mupirocin  ointment  1 Application Nasal BID   nortriptyline   20 mg Oral QHS   pantoprazole   40 mg Oral QAC breakfast   polyethylene glycol  17 g Oral Daily   pregabalin   100 mg Oral Daily   sertraline   100 mg Oral q AM   tamsulosin   0.4 mg Oral Daily   traZODone   150 mg Oral QHS

## 2024-03-26 NOTE — TOC Transition Note (Signed)
 Transition of Care [facility]) - Discharge Note   Patient Details  Name: Jeffrey [name] MRN: [id number] Date of Birth: Mar 16, 1961  [facility] CM/SW Contact:  Cosimo Diones, RN Phone Number: 03/26/2024, 11:23 AM   Clinical Narrative:   Patient will transition to his home in [place] today via [employer]. Case Manager was able to speak with Kaaren Ora driver and he will pick the patient up from the discharge lounge. No home needs identified at this time. Patient will dialyze at the hospital until Clip completed for outpatient center in [place].    Final next level of care: Home/Self Care Barriers to Discharge: No Barriers Identified   Patient Goals and CMS Choice Patient states their goals for this hospitalization and ongoing recovery are:: plan to return home to his apartment  Discharge Plan and Services Additional resources added to the After Visit Summary for   In-house Referral: NA Discharge Planning Services: CM Consult  Social Drivers of Health (SDOH) Interventions SDOH Screenings   Food Insecurity: No Food Insecurity (03/26/2024)  Housing: Patient Unable To Answer (03/26/2024)  Recent Concern: Housing - High Risk (01/08/2024)   Received from [facility]  Transportation Needs: No Transportation Needs (03/26/2024)  Utilities: Not At Risk (03/26/2024)  Financial Resource Strain: Medium Risk (11/10/2023)   Received from [facility]  Physical Activity: Inactive (09/08/2021)   Received from [facility], [facility]  Social Connections: Moderately Isolated (09/07/2022)   Received from [facility], [facility]  Stress: Stress Concern Present (09/07/2022)   Received from [facility]  Tobacco Use: Medium Risk (03/21/2024)     Readmission Risk Interventions    06/02/2023    4:31 PM 04/24/2023     4:36 PM 11/03/2022   10:19 AM  Readmission Risk Prevention Plan  Transportation Screening Complete Complete Complete  Medication Review (RN Care Manager) Complete Referral to Pharmacy Complete  PCP or Specialist appointment within 3-5 days of discharge Complete Complete --  HRI or Home Care Consult Complete Complete Complete  SW Recovery Care/Counseling Consult Complete Complete Complete  Palliative Care Screening Not Applicable Not Applicable Not Applicable  Skilled Nursing Facility Not Applicable Not Applicable Not Applicable

## 2024-03-26 NOTE — Plan of Care (Signed)
   Problem: Coping: Goal: Level of anxiety will decrease Outcome: Progressing

## 2024-03-26 NOTE — Progress Notes (Signed)
   03/26/24 1047  Vitals  Pulse Rate 75  Resp 11  BP (!) 153/61  SpO2 99 %  O2 Device Nasal Cannula  Oxygen Therapy  O2 Flow Rate (L/min) 4 L/min  Patient Activity (if Appropriate) In bed  Pulse Oximetry Type Continuous  Oximetry Probe Site Changed No  Post Treatment  Dialyzer Clearance Lightly streaked  Hemodialysis Intake (mL) 200 mL  Liters Processed 57.2  Fluid Removed (mL) 1800 mL  Tolerated HD Treatment Yes  AVG/AVF Arterial Site Held (minutes) 10 minutes  AVG/AVF Venous Site Held (minutes) 10 minutes  During Treatment Monitoring  Duration of HD Treatment -hour(s) 2.38 hour(s)   Received patient in bed to unit.  Alert and oriented.  Informed consent signed and in chart.   TX duration:2 HRS TERMINATED EARLY PER PT REQUEST DUE TO CRAMPING AMA SIGNED  Patient tolerated well.  Transported back to the room  Alert, without acute distress.  Hand-off given to patient's nurse.   Access used: LAVG Access issues: NONE  Total UF removed: 1.8L Medication(s) given: NONE    [facility]
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
## 2024-03-26 NOTE — Progress Notes (Signed)
 Advised by attending that pt will d/c to home today. Navigator will continue to follow and contact pt as out-pt regarding determination from [facility] for clinic in [place]. Pt currently does not have an accepting nephrologist or clinic in the [place] or [place] area. Pt comes to ED for assessment and HD treatments. Will continue to follow and communicate with pt.   Lauraine Polite Renal Navigator [phone]

## 2024-03-27 NOTE — Progress Notes (Signed)
 Late Note Entry- March 27, 2024  Navigator received a call late yesterday from [facility] admissions saying pt was denied by a [facility]nd [facility] clinic/provider therefore pt will not be able to be accepted at any [facility] clinic in [place]. Inquired of [facility] staff if pt could be considered for admission to a [facility] clinic under the 2nd chance program through renal network. [facility] staff advised that if pt can locate an accepting nephrologist that may be able to be investigated/explored. Spoke pt via phone this afternoon. Discussed the above info. Pt states he he can try to contact the nephrologist office that he was followed by in the past to see if provider will accept him back as a pt. Pt to return call to navigator after looking into this option. Navigator also advised pt that navigator has contacted [facility] in the [place] area to see if pt can be consider for a clinic in [place] under the 2nd chance program (through renal network). Navigator awaiting response back from [facility]. Pt's referral was an area denial for [facility] area but want to see if clinic would consider pt under special program.   Lauraine Polite [facility] Network [phone]

## 2024-03-28 ENCOUNTER — Emergency Department (HOSPITAL_COMMUNITY)
Admission: EM | Admit: 2024-03-28 | Discharge: 2024-03-28 | Discharge status: Against Medical Advice | Attending: Emergency Medicine | Admitting: Emergency Medicine

## 2024-03-28 ENCOUNTER — Encounter (HOSPITAL_COMMUNITY): Payer: Self-pay

## 2024-03-28 ENCOUNTER — Other Ambulatory Visit: Payer: Self-pay

## 2024-03-28 DIAGNOSIS — N186 End stage renal disease: Secondary | ICD-10-CM | POA: Insufficient documentation

## 2024-03-28 DIAGNOSIS — Z5321 Procedure and treatment not carried out due to patient leaving prior to being seen by health care provider: Secondary | ICD-10-CM | POA: Insufficient documentation

## 2024-03-28 DIAGNOSIS — Z452 Encounter for adjustment and management of vascular access device: Secondary | ICD-10-CM | POA: Diagnosis present

## 2024-03-28 DIAGNOSIS — Z992 Dependence on renal dialysis: Secondary | ICD-10-CM | POA: Insufficient documentation

## 2024-03-28 DIAGNOSIS — M542 Cervicalgia: Secondary | ICD-10-CM | POA: Diagnosis not present

## 2024-03-28 LAB — CBC
HCT: 25.9 % — ABNORMAL LOW (ref 39.0–52.0)
Hemoglobin: 8.1 g/dL — ABNORMAL LOW (ref 13.0–17.0)
MCH: 29.3 pg (ref 26.0–34.0)
MCHC: 31.3 g/dL (ref 30.0–36.0)
MCV: 93.8 fL (ref 80.0–100.0)
Platelets: 110 10*3/uL — ABNORMAL LOW (ref 150–400)
RBC: 2.76 MIL/uL — ABNORMAL LOW (ref 4.22–5.81)
RDW: 15.1 % (ref 11.5–15.5)
WBC: 6.7 10*3/uL (ref 4.0–10.5)
nRBC: 0 % (ref 0.0–0.2)

## 2024-03-28 LAB — RENAL FUNCTION PANEL
Albumin: 3.4 g/dL — ABNORMAL LOW (ref 3.5–5.0)
Anion gap: 15 (ref 5–15)
BUN: 69 mg/dL — ABNORMAL HIGH (ref 8–23)
CO2: 25 mmol/L (ref 22–32)
Calcium: 8.3 mg/dL — ABNORMAL LOW (ref 8.9–10.3)
Chloride: 93 mmol/L — ABNORMAL LOW (ref 98–111)
Creatinine, Ser: 5.86 mg/dL — ABNORMAL HIGH (ref 0.61–1.24)
GFR, Estimated: 10 mL/min — ABNORMAL LOW (ref >60)
Glucose, Bld: 428 mg/dL — ABNORMAL HIGH (ref 70–99)
Phosphorus: 4.7 mg/dL — ABNORMAL HIGH (ref 2.5–4.6)
Potassium: 4.2 mmol/L (ref 3.5–5.1)
Sodium: 133 mmol/L — ABNORMAL LOW (ref 135–145)

## 2024-03-28 MED ORDER — HYDROCODONE-ACETAMINOPHEN 5-325 MG PO TABS
1.0000 | ORAL_TABLET | Freq: Once | ORAL | Status: AC
  Administered 2024-03-28: 1 via ORAL
  Filled 2024-03-28: qty 1

## 2024-03-28 MED ORDER — HEPARIN SODIUM (PORCINE) 1000 UNIT/ML DIALYSIS
2000.0000 [IU] | Freq: Once | INTRAMUSCULAR | Status: AC
  Administered 2024-03-28: 2000 [IU] via INTRAVENOUS_CENTRAL

## 2024-03-28 MED ORDER — CHLORHEXIDINE GLUCONATE CLOTH 2 % EX PADS: 6.0000 | MEDICATED_PAD | Freq: Every day | CUTANEOUS | Status: DC

## 2024-03-28 MED ORDER — HEPARIN SODIUM (PORCINE) 1000 UNIT/ML IJ SOLN
INTRAMUSCULAR | Status: AC
  Filled 2024-03-28: qty 2

## 2024-03-28 NOTE — Progress Notes (Addendum)
 Met with pt while pt was receiving HD. Pt states that the nephrologist that he saw in the past in [place] is no longer practicing in that area. Discussed with pt the possible option of pt trying to make an appt with a partner of previous nephrologist to see if another provider would accept pt and be his provider for out-pt HD. Pt agreeable to attempt to make an appt with previous nephrologist's partner. Pt provided phone number that navigator found online to make an appt. Pt also advised that navigator was contacted by [facility] staff late yesterday to say that [facility] is not willing to consider taking pt under 2nd chance program through renal network.   Lauraine Polite Renal Navigator [phone]  Addendum at 2:13 pm: Pt contacted previous nephrologist office and was advised that referral would have to be made since it had been so long since pt had been seen. Navigator contacted nephrologist office ([facility] [phone]) to inquire how provider could make a referral. Navigator was advised that office does not accept referrals on pt's who are on HD. Spoke to pt via phone to provide this information/update. Update also provided to nephrologist and renal PA. Pt states he will likely be moving to new apt in [place] in the near future.

## 2024-03-28 NOTE — Procedures (Signed)
 Asked to see this patient for hospital dialysis. Patient was released from his outpatient HD unit > 1 year ago for behavior issues. The plan will be for "ED HD". Pt is not to be admitted. Pt will go to the dialysis unit upstairs when they are ready for the patient. When dialysis is completed pt will be sent back to ED for reassessment.    Last OP HD orders (dec 2023):  3:15h  129kg  RUE AVG   Heparin  2000       Clinical issues: 1) Anemia of esrd:  - pt dose not have an outpatient nephrologist or HD unit so does not have access to ESA and other needed medications.  - pt started darbe 60 mcg in spring 2024 May, and the dose was steadily increased until Oct 2024 when the Hb stabilized w/ darbe 200 mcg every 1-2 wks as tolerated - pt got 2 different 1 gm loading courses of IV Fe in summer / fall 2024 - darbepoeitin SQ needs to ordered as "once"  or "once in dialysis", otherwise the order will default to "SQ at 1800" but that timing is not good because he typically needs to be ready to leave at 5 pm.    Plan: Most recent 200 mcg doses were given SQ on: - 01/04/24 and 01/18/24 - 02/03/24 - 02/29/24 and 03/21/24 2.   Hb this week is in the 7.5- 8.5 range --> will need another dose the next time he is here  I was present at the procedure, reviewed the HD regimen and made appropriate changes.   Larry Poag MD  [facility] 03/28/2024, 3:56 PM

## 2024-03-28 NOTE — ED Triage Notes (Signed)
 Pt. Here for dialysis . Continues to have neck pain. Complete treatment on Tuesday.

## 2024-03-28 NOTE — Progress Notes (Signed)
   03/28/24 1512  Vitals  Temp 98.3 F (36.8 C)  Pulse Rate 75  Resp 13  BP (!) 134/55  SpO2 100 %  O2 Device Nasal Cannula  Weight  (STRETCHER)  Oxygen Therapy  O2 Flow Rate (L/min) 3 L/min  Patient Activity (if Appropriate) In bed  Pulse Oximetry Type Continuous  Oximetry Probe Site Changed No  Post Treatment  Dialyzer Clearance Lightly streaked  Hemodialysis Intake (mL) 0 mL  Liters Processed 74.6  Fluid Removed (mL) 2500 mL  Tolerated HD Treatment Yes  AVG/AVF Arterial Site Held (minutes) 8 minutes  AVG/AVF Venous Site Held (minutes) 8 minutes  During Treatment Monitoring  Duration of HD Treatment -hour(s) 3.25 hour(s)   Received patient in bed to unit.  Alert and oriented.  Informed consent signed and in chart.   TX duration:3.25HRS  Patient tolerated well.  Transported back to the room  Alert, without acute distress.  Hand-off given to patient's nurse.   Access used: LAVG Access issues: NONE  Total UF removed: 2.5L Medication(s) given: NONE   [name]

## 2024-03-28 NOTE — ED Notes (Signed)
 Pt returned from dialysis unit. No distress noted. Seen leaving ED in personal motorized wheelchair.
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
## 2024-03-30 ENCOUNTER — Other Ambulatory Visit: Payer: Self-pay

## 2024-03-30 ENCOUNTER — Emergency Department (HOSPITAL_COMMUNITY)
Admission: EM | Admit: 2024-03-30 | Discharge: 2024-03-30 | Disposition: A | Discharge status: Against Medical Advice | Attending: Emergency Medicine | Admitting: Emergency Medicine

## 2024-03-30 ENCOUNTER — Encounter (HOSPITAL_COMMUNITY): Payer: Self-pay | Admitting: Emergency Medicine

## 2024-03-30 DIAGNOSIS — N186 End stage renal disease: Secondary | ICD-10-CM | POA: Diagnosis present

## 2024-03-30 DIAGNOSIS — Z5329 Procedure and treatment not carried out because of patient's decision for other reasons: Secondary | ICD-10-CM | POA: Insufficient documentation

## 2024-03-30 DIAGNOSIS — Z7982 Long term (current) use of aspirin: Secondary | ICD-10-CM | POA: Diagnosis not present

## 2024-03-30 DIAGNOSIS — Z794 Long term (current) use of insulin: Secondary | ICD-10-CM | POA: Diagnosis not present

## 2024-03-30 DIAGNOSIS — Z89512 Acquired absence of left leg below knee: Secondary | ICD-10-CM | POA: Diagnosis not present

## 2024-03-30 DIAGNOSIS — Z992 Dependence on renal dialysis: Secondary | ICD-10-CM | POA: Insufficient documentation

## 2024-03-30 MED ORDER — CHLORHEXIDINE GLUCONATE CLOTH 2 % EX PADS: 6.0000 | MEDICATED_PAD | Freq: Every day | CUTANEOUS | Status: DC

## 2024-03-30 NOTE — ED Notes (Signed)
 Pt seen leaving with his transportation.

## 2024-03-30 NOTE — ED Notes (Signed)
 Pt let myself know that he didn't want to wait for dialysis any longer. Returned patient labels.

## 2024-03-30 NOTE — ED Triage Notes (Signed)
 Pt here for regularly scheduled dialysis session. States has his chronic neck pain. No other complaints or distress noted.

## 2024-03-30 NOTE — ED Provider Notes (Signed)
 [place] EMERGENCY DEPARTMENT AT [facility] Provider Note   CSN: [id number] Arrival date & time: 03/30/24  4098     History {Add pertinent medical, surgical, social history, OB history to HPI:1} Chief Complaint  Patient presents with   Vascular Access Problem    Jeffrey [name] is a 63 y.o. male.  HPI 63 year old male end-stage renal disease on dialysis with last dialysis on Thursday presents today for dialysis.  He denies any significant dyspnea but is on his home oxygen.  He denies any other acute problems today     Home Medications Prior to Admission medications   Medication Sig Start Date End Date Taking? Authorizing Provider  acetaminophen  (TYLENOL ) 325 MG tablet Take 2 tablets (650 mg total) by mouth every 6 (six) hours. 07/23/23   Santa Cuba, MD  amLODipine  (NORVASC ) 10 MG tablet Take 10 mg by mouth every morning. 04/17/20   [provider]  ASPIRIN  LOW DOSE 81 MG EC tablet Take 81 mg by mouth in the morning. 02/22/21   [provider]  atorvastatin  (LIPITOR) 40 MG tablet Take 40 mg by mouth in the morning. 02/09/21   [provider]  calcium  acetate (PHOSLO ) 667 MG capsule Take 2 capsules (1,334 mg total) by mouth with breakfast, with lunch, and with evening meal. 12/21/23   Ejigiri, Juanda Noon, PA-C  carvedilol  (COREG ) 25 MG tablet Take 1 tablet (25 mg total) by mouth 2 (two) times daily with a meal. 03/26/24   Samtani, Jai-Gurmukh, MD  cyclobenzaprine  (FLEXERIL ) 10 MG tablet Take 10 mg by mouth 3 (three) times daily. 10/14/23   [provider]  diazepam  (VALIUM ) 5 MG tablet Take 5 mg by mouth 2 (two) times daily as needed for muscle spasms. 12/09/21   [provider]  HYDROcodone -acetaminophen  (NORCO/VICODIN) 5-325 MG tablet Take 1 tablet by mouth every 4 (four) hours as needed for moderate pain (pain score 4-6). 03/26/24   Samtani, Jai-Gurmukh, MD  insulin  lispro (HUMALOG  KWIKPEN) 100 UNIT/ML KwikPen Inject 0-9 Units  into the skin 3 (three) times daily with meals. CBG < 70: treat low blood sugar CBG 70 - 120: 0 units  CBG 121 - 150: 1 unit  CBG 151 - 200: 2 units  CBG 201 - 250: 3 units  CBG 251 - 300: 5 units  CBG 301 - 350: 7 units  CBG 351 - 400: 9 units  CBG > 400: call MD 09/27/23   Etter Hermann., MD  insulin  regular human CONCENTRATED (HUMULIN  R U-500 KWIKPEN) 500 UNIT/ML KwikPen Inject 55 Units into the skin 2 (two) times daily with a meal. Follow up with your PCP and [facility] for additional adjustments 09/27/23 03/21/24  Etter Hermann., MD  iron  polysaccharides (NIFEREX) 150 MG capsule Take 1 capsule (150 mg total) by mouth daily. 09/26/23   Ozell Blunt, MD  nortriptyline  (PAMELOR ) 10 MG capsule Take 20 mg by mouth at bedtime.    [provider]  [facility] VERIO test strip 1 each by Other route as needed. 03/04/24   [provider]  pantoprazole  (PROTONIX ) 40 MG tablet Take 40 mg by mouth daily before breakfast. 02/09/21   [provider]  pregabalin  (LYRICA ) 100 MG capsule Take 100 mg by mouth. 03/20/24 04/19/24  [provider]  sertraline  (ZOLOFT ) 100 MG tablet Take 100 mg by mouth in the morning. 02/22/21   [provider]  tamsulosin  (FLOMAX ) 0.4 MG CAPS capsule Take 1 capsule (0.4 mg total) by  mouth daily. 11/10/21   Modena Andes, MD  traZODone  (DESYREL ) 150 MG tablet Take 150 mg by mouth at bedtime. 12/09/21   [provider]  TRELEGY ELLIPTA 100-62.5-25 MCG/ACT AEPB Take 1 puff by mouth daily. 02/19/24   [provider]  Vitamin D , Ergocalciferol , (DRISDOL ) 1.25 MG (50000 UNIT) CAPS capsule Take 1 capsule (50,000 Units total) by mouth every 7 (seven) days. Patient not taking: Reported on 03/21/2024 10/02/23   Ozell Blunt, MD      Allergies    Actos [pioglitazone], Dexmedetomidine , Ibuprofen, Tomato, and Wellbutrin [bupropion]    Review of Systems   Review of Systems  Physical Exam Updated Vital Signs BP  (!) 149/58 (BP Location: Right Arm)   Pulse 87   Temp 98.3 F (36.8 C) (Oral)   Ht 1.651 m (5\' 5" )   Wt 136.1 kg   SpO2 97%   BMI 49.92 kg/m  Physical Exam Vitals and nursing note reviewed.  Constitutional:      Appearance: He is well-developed.  HENT:     Head: Normocephalic and atraumatic.     Right Ear: External ear normal.     Left Ear: External ear normal.     Nose: Nose normal.  Neck:     Trachea: No tracheal deviation.  Cardiovascular:     Rate and Rhythm: Normal rate.  Pulmonary:     Effort: Pulmonary effort is normal.  Musculoskeletal:        General: Normal range of motion.     Comments: Left lower extremity amputation  Skin:    General: Skin is warm and dry.  Neurological:     Mental Status: He is alert and oriented to person, place, and time.  Psychiatric:        Behavior: Behavior normal.     ED Results / Procedures / Treatments   Labs (all labs ordered are listed, but only abnormal results are displayed) Labs Reviewed - No data to display  EKG None  Radiology No results found.  Procedures Procedures  {Document cardiac monitor, telemetry assessment procedure when appropriate:1}  Medications Ordered in ED Medications - No data to display  ED Course/ Medical Decision Making/ A&P   {   Click here for ABCD2, HEART and other calculatorsREFRESH Note before signing :1}                              Medical Decision Making  Discussed with Dr. Zana Hesselbach who will see for dialysis  {Document critical care time when appropriate:1} {Document review of labs and clinical decision tools ie heart score, Chads2Vasc2 etc:1}  {Document your independent review of radiology images, and any outside records:1} {Document your discussion with family members, caretakers, and with consultants:1} {Document social determinants of health affecting pt's care:1} {Document your decision making why or why not admission, treatments were needed:1} Final Clinical  Impression(s) / ED Diagnoses Final diagnoses:  None    Rx / DC Orders ED Discharge Orders     None

## 2024-04-02 ENCOUNTER — Other Ambulatory Visit: Payer: Self-pay

## 2024-04-02 ENCOUNTER — Encounter (HOSPITAL_COMMUNITY): Payer: Self-pay | Admitting: Emergency Medicine

## 2024-04-02 ENCOUNTER — Emergency Department (HOSPITAL_COMMUNITY): Admission: EM | Admit: 2024-04-02 | Discharge: 2024-04-02 | Discharge status: Against Medical Advice

## 2024-04-02 DIAGNOSIS — Z7982 Long term (current) use of aspirin: Secondary | ICD-10-CM | POA: Insufficient documentation

## 2024-04-02 DIAGNOSIS — Z5329 Procedure and treatment not carried out because of patient's decision for other reasons: Secondary | ICD-10-CM | POA: Diagnosis not present

## 2024-04-02 DIAGNOSIS — N186 End stage renal disease: Secondary | ICD-10-CM | POA: Insufficient documentation

## 2024-04-02 DIAGNOSIS — Z992 Dependence on renal dialysis: Secondary | ICD-10-CM | POA: Insufficient documentation

## 2024-04-02 LAB — CBC WITH DIFFERENTIAL/PLATELET
Abs Immature Granulocytes: 0.04 10*3/uL (ref 0.00–0.07)
Basophils Absolute: 0 10*3/uL (ref 0.0–0.1)
Basophils Relative: 0 %
Eosinophils Absolute: 0.1 10*3/uL (ref 0.0–0.5)
Eosinophils Relative: 2 %
HCT: 25.9 % — ABNORMAL LOW (ref 39.0–52.0)
Hemoglobin: 7.9 g/dL — ABNORMAL LOW (ref 13.0–17.0)
Immature Granulocytes: 1 %
Lymphocytes Relative: 14 %
Lymphs Abs: 0.7 10*3/uL (ref 0.7–4.0)
MCH: 28.6 pg (ref 26.0–34.0)
MCHC: 30.5 g/dL (ref 30.0–36.0)
MCV: 93.8 fL (ref 80.0–100.0)
Monocytes Absolute: 0.4 10*3/uL (ref 0.1–1.0)
Monocytes Relative: 9 %
Neutro Abs: 3.6 10*3/uL (ref 1.7–7.7)
Neutrophils Relative %: 74 %
Platelets: 80 10*3/uL — ABNORMAL LOW (ref 150–400)
RBC: 2.76 MIL/uL — ABNORMAL LOW (ref 4.22–5.81)
RDW: 15.2 % (ref 11.5–15.5)
WBC: 4.9 10*3/uL (ref 4.0–10.5)
nRBC: 0 % (ref 0.0–0.2)

## 2024-04-02 LAB — MAGNESIUM: Magnesium: 2 mg/dL (ref 1.7–2.4)

## 2024-04-02 MED ORDER — DARBEPOETIN ALFA 200 MCG/0.4ML IJ SOSY
200.0000 ug | PREFILLED_SYRINGE | Freq: Once | INTRAMUSCULAR | Status: DC
  Filled 2024-04-02: qty 0.4

## 2024-04-02 MED ORDER — CHLORHEXIDINE GLUCONATE CLOTH 2 % EX PADS: 6.0000 | MEDICATED_PAD | Freq: Every day | CUTANEOUS | Status: DC

## 2024-04-02 MED ORDER — HEPARIN SODIUM (PORCINE) 1000 UNIT/ML IJ SOLN
2000.0000 [IU] | Freq: Once | INTRAMUSCULAR | Status: AC
  Administered 2024-04-02: 2000 [IU] via INTRAVENOUS

## 2024-04-02 NOTE — Progress Notes (Addendum)
 Mr. Sewer presents to the ED today for dialysis needs. He was discharged over a year ago due to behavior issues. Noted his last HD here was on 03/28/24. Plan for patient to receive ED HD. He is not to be admitted and will return back to the ED after treatment for reassessment.  Last OP HD orders (dec 2023):  3:15h  129kg  RUE AVG   Heparin  2000    Clinical issues: 1) Anemia of esrd:  - pt dose not have an outpatient nephrologist or HD unit so does not have access to ESA and other needed medications.  - pt started darbe 60 mcg in spring 2024 May, and the dose was steadily increased until Oct 2024 when the Hb stabilized w/ darbe 200 mcg every 1-2 wks as tolerated - pt got 2 different 1 gm loading courses of IV Fe in summer / fall 2024 - darbepoeitin SQ needs to ordered as "once"  or "once in dialysis", otherwise the order will default to "SQ at 1800" but that timing is not good because he typically needs to be ready to leave at 5 pm.   Physical Assessment General: Obese; NAD; on 3L O2 (I believe this is his baseline O2) Heart: S1 and S2; No murmurs, gallops, or rubs Lungs: Clear anteriorly Abdomen: Large, round, soft Extremities: L AKA Dialysis Access: R AVG  Plan: Most recent 200 mcg doses were given SQ on: - 01/04/24 and 01/18/24 - 02/03/24 - 02/29/24 and 03/21/24 2. Ordered RFP, Mg, and CBC to be obtained pre-HD today 3. Hb this week is in the 7.5- 8.5 range --> Ordered ESA dose to be given with HD today  Jadene Maxwell, NP [facility]

## 2024-04-02 NOTE — ED Triage Notes (Signed)
 Pt here for dialysis, last tx Thursday.

## 2024-04-02 NOTE — ED Provider Notes (Signed)
 [place] EMERGENCY DEPARTMENT AT [facility] Provider Note   CSN: [id number] Arrival date & time: 04/02/24  2130     History  No chief complaint on file.   Jeffrey LEMMEN is a 63 y.o. male.  63 year old male with past medical history significant for ESRD presents today for concern of requiring routine dialysis.  He denies any chest pain, shortness of breath.  The history is provided by the patient. No language interpreter was used.       Home Medications Prior to Admission medications   Medication Sig Start Date End Date Taking? Authorizing Provider  acetaminophen  (TYLENOL ) 325 MG tablet Take 2 tablets (650 mg total) by mouth every 6 (six) hours. 07/23/23   Santa Cuba, MD  amLODipine  (NORVASC ) 10 MG tablet Take 10 mg by mouth every morning. 04/17/20   [provider]  ASPIRIN  LOW DOSE 81 MG EC tablet Take 81 mg by mouth in the morning. 02/22/21   [provider]  atorvastatin  (LIPITOR) 40 MG tablet Take 40 mg by mouth in the morning. 02/09/21   [provider]  calcium  acetate (PHOSLO ) 667 MG capsule Take 2 capsules (1,334 mg total) by mouth with breakfast, with lunch, and with evening meal. 12/21/23   Ejigiri, Juanda Noon, PA-C  carvedilol  (COREG ) 25 MG tablet Take 1 tablet (25 mg total) by mouth 2 (two) times daily with a meal. 03/26/24   Samtani, Jai-Gurmukh, MD  cyclobenzaprine  (FLEXERIL ) 10 MG tablet Take 10 mg by mouth 3 (three) times daily. 10/14/23   [provider]  diazepam  (VALIUM ) 5 MG tablet Take 5 mg by mouth 2 (two) times daily as needed for muscle spasms. 12/09/21   [provider]  HYDROcodone -acetaminophen  (NORCO/VICODIN) 5-325 MG tablet Take 1 tablet by mouth every 4 (four) hours as needed for moderate pain (pain score 4-6). 03/26/24   Samtani, Jai-Gurmukh, MD  insulin  lispro (HUMALOG  KWIKPEN) 100 UNIT/ML KwikPen Inject 0-9 Units into the skin 3 (three) times daily with meals. CBG < 70: treat low blood sugar CBG  70 - 120: 0 units  CBG 121 - 150: 1 unit  CBG 151 - 200: 2 units  CBG 201 - 250: 3 units  CBG 251 - 300: 5 units  CBG 301 - 350: 7 units  CBG 351 - 400: 9 units  CBG > 400: call MD 09/27/23   Etter Hermann., MD  insulin  regular human CONCENTRATED (HUMULIN  R U-500 KWIKPEN) 500 UNIT/ML KwikPen Inject 55 Units into the skin 2 (two) times daily with a meal. Follow up with your PCP and [facility] for additional adjustments 09/27/23 03/21/24  Etter Hermann., MD  iron  polysaccharides (NIFEREX) 150 MG capsule Take 1 capsule (150 mg total) by mouth daily. 09/26/23   Ozell Blunt, MD  nortriptyline  (PAMELOR ) 10 MG capsule Take 20 mg by mouth at bedtime.    [provider]  [facility] VERIO test strip 1 each by Other route as needed. 03/04/24   [provider]  pantoprazole  (PROTONIX ) 40 MG tablet Take 40 mg by mouth daily before breakfast. 02/09/21   [provider]  pregabalin  (LYRICA ) 100 MG capsule Take 100 mg by mouth. 03/20/24 04/19/24  [provider]  sertraline  (ZOLOFT ) 100 MG tablet Take 100 mg by mouth in the morning. 02/22/21   [provider]  tamsulosin  (FLOMAX ) 0.4 MG CAPS capsule Take 1 capsule (0.4 mg total) by mouth daily. 11/10/21   Modena Andes, MD  traZODone  (DESYREL ) 150  MG tablet Take 150 mg by mouth at bedtime. 12/09/21   [provider]  TRELEGY ELLIPTA 100-62.5-25 MCG/ACT AEPB Take 1 puff by mouth daily. 02/19/24   [provider]  Vitamin D , Ergocalciferol , (DRISDOL ) 1.25 MG (50000 UNIT) CAPS capsule Take 1 capsule (50,000 Units total) by mouth every 7 (seven) days. Patient not taking: Reported on 03/21/2024 10/02/23   Ozell Blunt, MD      Allergies    Actos [pioglitazone], Dexmedetomidine , Ibuprofen, Tomato, and Wellbutrin [bupropion]    Review of Systems   Review of Systems  Constitutional:  Negative for fever.  Respiratory:  Negative for shortness of breath.   Cardiovascular:  Negative for chest  pain and leg swelling.  All other systems reviewed and are negative.   Physical Exam Updated Vital Signs BP (!) 125/49 (BP Location: Right Arm)   Pulse 79   Temp 98.5 F (36.9 C)   Resp 18   Ht 5\' 5"  (1.651 m)   Wt 136 kg   SpO2 98%   BMI 49.89 kg/m  Physical Exam Vitals and nursing note reviewed.  Constitutional:      General: He is not in acute distress.    Appearance: Normal appearance. He is not ill-appearing.  HENT:     Head: Normocephalic and atraumatic.     Nose: Nose normal.  Eyes:     Conjunctiva/sclera: Conjunctivae normal.  Cardiovascular:     Rate and Rhythm: Normal rate.  Pulmonary:     Effort: Pulmonary effort is normal. No respiratory distress.  Musculoskeletal:        General: No deformity. Normal range of motion.     Cervical back: Normal range of motion.  Skin:    Findings: No rash.  Neurological:     Mental Status: He is alert.     ED Results / Procedures / Treatments   Labs (all labs ordered are listed, but only abnormal results are displayed) Labs Reviewed - No data to display  EKG None  Radiology No results found.  Procedures Procedures    Medications Ordered in ED Medications  Chlorhexidine  Gluconate Cloth 2 % PADS 6 each (has no administration in time range)  Darbepoetin Alfa  (ARANESP ) injection 200 mcg (has no administration in time range)    ED Course/ Medical Decision Making/ A&P                                 Medical Decision Making  63 year old male with past medical history significant for ESRD presents today for routine dialysis.  No signs of volume overload or other complaints. Discussed with nephrology who will set him up for dialysis at some point today.  Recently had labs on 5/1.  CBC and renal function panel were stable at this time. He will require reevaluation after dialysis prior to discharge.   Final Clinical Impression(s) / ED Diagnoses Final diagnoses:  ESRD (end stage renal disease) [facility])    Rx /  DC Orders ED Discharge Orders     None         Lucina Sabal, PA-C 04/02/24 1248    Rolinda Climes, DO 04/02/24 1534

## 2024-04-02 NOTE — Progress Notes (Signed)
   04/02/24 1613  Vitals  Temp 97.9 F (36.6 C)  Pulse Rate 77  Resp 18  BP (!) 161/86  SpO2 100 %  O2 Device Nasal Cannula  Oxygen Therapy  O2 Flow Rate (L/min) 3 L/min  Patient Activity (if Appropriate) In bed  Pulse Oximetry Type Continuous  Oximetry Probe Site Changed No  Post Treatment  Dialyzer Clearance Lightly streaked  Hemodialysis Intake (mL) 0 mL  Liters Processed 70.9  Fluid Removed (mL) 2200 mL  Tolerated HD Treatment Yes  AVG/AVF Arterial Site Held (minutes) 10 minutes  AVG/AVF Venous Site Held (minutes) 10 minutes  During Treatment Monitoring  Duration of HD Treatment -hour(s) 2.92 hour(s)   Received patient in bed to unit.  Alert and oriented.  Informed consent signed and in chart.   TX duration:pt ran 3 hours of his 3.154 t  Patient tolerated well.  Transported back to the room  Alert, without acute distress.  Hand-off given to patient's nurse.   Access used: LUAG Access issues: no complications  Total UF removed: 2200 Medication(s) given: none   [facility]

## 2024-04-04 ENCOUNTER — Emergency Department (HOSPITAL_COMMUNITY)
Admission: EM | Admit: 2024-04-04 | Discharge: 2024-04-04 | Disposition: A | Discharge status: Home / Self Care | Attending: Emergency Medicine | Admitting: Emergency Medicine

## 2024-04-04 DIAGNOSIS — Z794 Long term (current) use of insulin: Secondary | ICD-10-CM | POA: Insufficient documentation

## 2024-04-04 DIAGNOSIS — N186 End stage renal disease: Secondary | ICD-10-CM | POA: Insufficient documentation

## 2024-04-04 DIAGNOSIS — G8929 Other chronic pain: Secondary | ICD-10-CM | POA: Diagnosis not present

## 2024-04-04 DIAGNOSIS — Z992 Dependence on renal dialysis: Secondary | ICD-10-CM | POA: Insufficient documentation

## 2024-04-04 DIAGNOSIS — M542 Cervicalgia: Secondary | ICD-10-CM | POA: Diagnosis present

## 2024-04-04 DIAGNOSIS — Z7982 Long term (current) use of aspirin: Secondary | ICD-10-CM | POA: Diagnosis not present

## 2024-04-04 DIAGNOSIS — D631 Anemia in chronic kidney disease: Secondary | ICD-10-CM | POA: Diagnosis not present

## 2024-04-04 DIAGNOSIS — Z4901 Encounter for fitting and adjustment of extracorporeal dialysis catheter: Secondary | ICD-10-CM | POA: Insufficient documentation

## 2024-04-04 LAB — CBC WITH DIFFERENTIAL/PLATELET
Abs Immature Granulocytes: 0.03 10*3/uL (ref 0.00–0.07)
Basophils Absolute: 0 10*3/uL (ref 0.0–0.1)
Basophils Relative: 0 %
Eosinophils Absolute: 0.1 10*3/uL (ref 0.0–0.5)
Eosinophils Relative: 2 %
HCT: 25.2 % — ABNORMAL LOW (ref 39.0–52.0)
Hemoglobin: 7.8 g/dL — ABNORMAL LOW (ref 13.0–17.0)
Immature Granulocytes: 1 %
Lymphocytes Relative: 12 %
Lymphs Abs: 0.5 10*3/uL — ABNORMAL LOW (ref 0.7–4.0)
MCH: 29 pg (ref 26.0–34.0)
MCHC: 31 g/dL (ref 30.0–36.0)
MCV: 93.7 fL (ref 80.0–100.0)
Monocytes Absolute: 0.4 10*3/uL (ref 0.1–1.0)
Monocytes Relative: 9 %
Neutro Abs: 3.2 10*3/uL (ref 1.7–7.7)
Neutrophils Relative %: 76 %
Platelets: 108 10*3/uL — ABNORMAL LOW (ref 150–400)
RBC: 2.69 MIL/uL — ABNORMAL LOW (ref 4.22–5.81)
RDW: 14.8 % (ref 11.5–15.5)
WBC: 4.3 10*3/uL (ref 4.0–10.5)
nRBC: 0 % (ref 0.0–0.2)

## 2024-04-04 LAB — RENAL FUNCTION PANEL
Albumin: 3.3 g/dL — ABNORMAL LOW (ref 3.5–5.0)
Anion gap: 13 (ref 5–15)
BUN: 72 mg/dL — ABNORMAL HIGH (ref 8–23)
CO2: 26 mmol/L (ref 22–32)
Calcium: 8.1 mg/dL — ABNORMAL LOW (ref 8.9–10.3)
Chloride: 98 mmol/L (ref 98–111)
Creatinine, Ser: 5.68 mg/dL — ABNORMAL HIGH (ref 0.61–1.24)
GFR, Estimated: 11 mL/min — ABNORMAL LOW (ref >60)
Glucose, Bld: 195 mg/dL — ABNORMAL HIGH (ref 70–99)
Phosphorus: >30 mg/dL — ABNORMAL HIGH (ref 2.5–4.6)
Potassium: 4.9 mmol/L (ref 3.5–5.1)
Sodium: 137 mmol/L (ref 135–145)

## 2024-04-04 LAB — MAGNESIUM: Magnesium: 1.9 mg/dL (ref 1.7–2.4)

## 2024-04-04 MED ORDER — CHLORHEXIDINE GLUCONATE CLOTH 2 % EX PADS: 6.0000 | MEDICATED_PAD | Freq: Every day | CUTANEOUS | Status: DC

## 2024-04-04 MED ORDER — LIDOCAINE HCL (PF) 1 % IJ SOLN: 5.0000 mL | INTRAMUSCULAR | Status: DC | PRN

## 2024-04-04 MED ORDER — PREGABALIN 100 MG PO CAPS
100.0000 mg | ORAL_CAPSULE | Freq: Once | ORAL | Status: AC
  Administered 2024-04-04: 100 mg via ORAL
  Filled 2024-04-04: qty 1

## 2024-04-04 MED ORDER — DARBEPOETIN ALFA 200 MCG/0.4ML IJ SOSY
200.0000 ug | PREFILLED_SYRINGE | Freq: Once | INTRAMUSCULAR | Status: AC
  Administered 2024-04-04: 200 ug via SUBCUTANEOUS
  Filled 2024-04-04: qty 0.4

## 2024-04-04 MED ORDER — PENTAFLUOROPROP-TETRAFLUOROETH EX AERO: 1.0000 | INHALATION_SPRAY | CUTANEOUS | Status: DC | PRN

## 2024-04-04 MED ORDER — LIDOCAINE-PRILOCAINE 2.5-2.5 % EX CREA: 1.0000 | TOPICAL_CREAM | CUTANEOUS | Status: DC | PRN

## 2024-04-04 MED ORDER — DIAZEPAM 5 MG PO TABS
5.0000 mg | ORAL_TABLET | Freq: Once | ORAL | Status: AC
  Administered 2024-04-04: 5 mg via ORAL
  Filled 2024-04-04: qty 1

## 2024-04-04 MED ORDER — ALTEPLASE 2 MG IJ SOLR: 2.0000 mg | Freq: Once | INTRAMUSCULAR | Status: DC | PRN

## 2024-04-04 MED ORDER — HEPARIN SODIUM (PORCINE) 1000 UNIT/ML DIALYSIS: 1000.0000 [IU] | INTRAMUSCULAR | Status: DC | PRN

## 2024-04-04 NOTE — ED Provider Notes (Signed)
 [place] EMERGENCY DEPARTMENT AT [facility] Provider Note   CSN: [id number] Arrival date & time: 04/04/24  0734     History  Chief Complaint  Patient presents with   Vascular Access Problem   Neck Pain    Jeffrey [name] is a 63 y.o. male.  63 yo M with a chief complaints of needing dialysis.  Patient has been getting dialysis at the hospital for some time now.  He has chronic neck pain that has been bothering him a little bit.  Has some pain and bruising at his fistula site from last access 2 days ago.  He said they had some difficulty obtaining access.   Neck Pain      Home Medications Prior to Admission medications   Medication Sig Start Date End Date Taking? Authorizing Provider  acetaminophen  (TYLENOL ) 325 MG tablet Take 2 tablets (650 mg total) by mouth every 6 (six) hours. 07/23/23  Yes Santa Cuba, MD  amLODipine  (NORVASC ) 10 MG tablet Take 10 mg by mouth every morning. 04/17/20  Yes [provider]  ASPIRIN  LOW DOSE 81 MG EC tablet Take 81 mg by mouth in the morning. 02/22/21  Yes [provider]  atorvastatin  (LIPITOR) 40 MG tablet Take 40 mg by mouth in the morning. 02/09/21  Yes [provider]  calcium  acetate (PHOSLO ) 667 MG capsule Take 2 capsules (1,334 mg total) by mouth with breakfast, with lunch, and with evening meal. 12/21/23  Yes Ejigiri, Juanda Noon, PA-C  carvedilol  (COREG ) 25 MG tablet Take 1 tablet (25 mg total) by mouth 2 (two) times daily with a meal. 03/26/24  Yes Samtani, Jai-Gurmukh, MD  cyclobenzaprine  (FLEXERIL ) 10 MG tablet Take 10 mg by mouth 3 (three) times daily. 10/14/23  Yes [provider]  diazepam  (VALIUM ) 5 MG tablet Take 5 mg by mouth 2 (two) times daily as needed for muscle spasms. 12/09/21  Yes [provider]  HYDROcodone -acetaminophen  (NORCO/VICODIN) 5-325 MG tablet Take 1 tablet by mouth every 4 (four) hours as needed for moderate pain (pain score 4-6). 03/26/24  Yes Samtani,  Jai-Gurmukh, MD  insulin  lispro (HUMALOG  KWIKPEN) 100 UNIT/ML KwikPen Inject 0-9 Units into the skin 3 (three) times daily with meals. CBG < 70: treat low blood sugar CBG 70 - 120: 0 units  CBG 121 - 150: 1 unit  CBG 151 - 200: 2 units  CBG 201 - 250: 3 units  CBG 251 - 300: 5 units  CBG 301 - 350: 7 units  CBG 351 - 400: 9 units  CBG > 400: call MD 09/27/23   Etter Hermann., MD  insulin  regular human CONCENTRATED (HUMULIN  R U-500 KWIKPEN) 500 UNIT/ML KwikPen Inject 55 Units into the skin 2 (two) times daily with a meal. Follow up with your PCP and [facility] for additional adjustments 09/27/23 03/21/24  Etter Hermann., MD  iron  polysaccharides (NIFEREX) 150 MG capsule Take 1 capsule (150 mg total) by mouth daily. 09/26/23   Ozell Blunt, MD  nortriptyline  (PAMELOR ) 10 MG capsule Take 20 mg by mouth at bedtime.    [provider]  [facility] VERIO test strip 1 each by Other route as needed. 03/04/24   [provider]  pantoprazole  (PROTONIX ) 40 MG tablet Take 40 mg by mouth daily before breakfast. 02/09/21   [provider]  pregabalin  (LYRICA ) 100 MG capsule Take 100 mg by mouth. 03/20/24 04/19/24  [provider]  sertraline  (ZOLOFT ) 100 MG tablet Take 100 mg  by mouth in the morning. 02/22/21   [provider]  tamsulosin  (FLOMAX ) 0.4 MG CAPS capsule Take 1 capsule (0.4 mg total) by mouth daily. 11/10/21   Modena Andes, MD  traZODone  (DESYREL ) 150 MG tablet Take 150 mg by mouth at bedtime. 12/09/21   [provider]  TRELEGY ELLIPTA 100-62.5-25 MCG/ACT AEPB Take 1 puff by mouth daily. 02/19/24   [provider]  Vitamin D , Ergocalciferol , (DRISDOL ) 1.25 MG (50000 UNIT) CAPS capsule Take 1 capsule (50,000 Units total) by mouth every 7 (seven) days. Patient not taking: Reported on 03/21/2024 10/02/23   Ozell Blunt, MD      Allergies    Actos [pioglitazone], Dexmedetomidine , Ibuprofen, Tomato, and Wellbutrin [bupropion]     Review of Systems   Review of Systems  Musculoskeletal:  Positive for neck pain.    Physical Exam Updated Vital Signs BP (!) 153/65 (BP Location: Right Wrist)   Pulse 76   Temp 98.7 F (37.1 C) (Oral)   Resp 11   Wt 129 kg   SpO2 99%   BMI 47.33 kg/m  Physical Exam Vitals and nursing note reviewed.  Constitutional:      Appearance: He is well-developed.  HENT:     Head: Normocephalic and atraumatic.  Eyes:     Pupils: Pupils are equal, round, and reactive to light.  Neck:     Vascular: No JVD.  Cardiovascular:     Rate and Rhythm: Normal rate and regular rhythm.     Heart sounds: No murmur heard.    No friction rub. No gallop.  Pulmonary:     Effort: No respiratory distress.     Breath sounds: No wheezing.  Abdominal:     General: There is no distension.     Tenderness: There is no abdominal tenderness. There is no guarding or rebound.  Musculoskeletal:        General: Normal range of motion.     Cervical back: Normal range of motion and neck supple.     Comments: Some bruising to the left upper arm.  Positive thrill.  Skin:    Coloration: Skin is not pale.     Findings: No rash.  Neurological:     Mental Status: He is alert and oriented to person, place, and time.  Psychiatric:        Behavior: Behavior normal.     ED Results / Procedures / Treatments   Labs (all labs ordered are listed, but only abnormal results are displayed) Labs Reviewed  RENAL FUNCTION PANEL  CBC WITH DIFFERENTIAL/PLATELET  MAGNESIUM     EKG None  Radiology No results found.  Procedures Procedures    Medications Ordered in ED Medications  Chlorhexidine  Gluconate Cloth 2 % PADS 6 each (has no administration in time range)  pentafluoroprop-tetrafluoroeth (GEBAUERS) aerosol 1 Application (has no administration in time range)  lidocaine  (PF) (XYLOCAINE ) 1 % injection 5 mL (has no administration in time range)  lidocaine -prilocaine  (EMLA ) cream 1 Application (has no  administration in time range)  heparin  injection 1,000 Units (has no administration in time range)  alteplase  (CATHFLO ACTIVASE ) injection 2 mg (has no administration in time range)  pregabalin  (LYRICA ) capsule 100 mg (100 mg Oral Given 04/04/24 0909)  diazepam  (VALIUM ) tablet 5 mg (5 mg Oral Given 04/04/24 0909)    ED Course/ Medical Decision Making/ A&P  Medical Decision Making Risk Prescription drug management.   63 yo M here for dialysis.  Denies any acute complaints.  Has had some pain with his neck off and on and sees pain management through the [facility] system.  He has some bruising to his dialysis access I think likely secondary to a hematoma.  Discussed the case with nephrology, Dr. Higinio Love will put in orders for dialysis.  The patients results and plan were reviewed and discussed.   Any x-rays performed were independently reviewed by myself.   Differential diagnosis were considered with the presenting HPI.  Medications  Chlorhexidine  Gluconate Cloth 2 % PADS 6 each (has no administration in time range)  pentafluoroprop-tetrafluoroeth (GEBAUERS) aerosol 1 Application (has no administration in time range)  lidocaine  (PF) (XYLOCAINE ) 1 % injection 5 mL (has no administration in time range)  lidocaine -prilocaine  (EMLA ) cream 1 Application (has no administration in time range)  heparin  injection 1,000 Units (has no administration in time range)  alteplase  (CATHFLO ACTIVASE ) injection 2 mg (has no administration in time range)  pregabalin  (LYRICA ) capsule 100 mg (100 mg Oral Given 04/04/24 0909)  diazepam  (VALIUM ) tablet 5 mg (5 mg Oral Given 04/04/24 0909)    Vitals:   04/04/24 0747 04/04/24 0942 04/04/24 1000  BP: 127/81 (!) 150/130 (!) 153/65  Pulse: 84 78 76  Resp: 16 18 11   Temp: 98.2 F (36.8 C) 98.7 F (37.1 C)   TempSrc: Oral Oral   SpO2: 100% 100% 99%  Weight:  129 kg     Final diagnoses:  Encounter for dialysis [facility])    Admission/  observation were discussed with the admitting physician, patient and/or family and they are comfortable with the plan.          Final Clinical Impression(s) / ED Diagnoses Final diagnoses:  Encounter for dialysis [facility])    Rx / DC Orders ED Discharge Orders     None         Albertus Hughs, DO 04/04/24 1125

## 2024-04-04 NOTE — ED Triage Notes (Signed)
 Pt. Stated, Im having problems with my access since Tuesday. It hurts from the time they stick me til the end. This started on Tuesday.. Pt did all but 30 min. On Tuesday. Continues to have neck pain

## 2024-04-04 NOTE — Progress Notes (Signed)
 Mr. Kope presents to the ED today for dialysis needs. He was discharged over a year ago due to behavior issues. Noted his last HD here was on 04/02/24 and 2.2L was removed. Plan for patient to receive ED HD. He is not to be admitted and will return back to the ED after treatment for reassessment.   Last OP HD orders (dec 2023):  3:15h  129kg  RUE AVG   Heparin  2000     Clinical issues: 1) Anemia of esrd:  - pt dose not have an outpatient nephrologist or HD unit so does not have access to ESA and other needed medications.  - pt started darbe 60 mcg in spring 2024 May, and the dose was steadily increased until Oct 2024 when the Hb stabilized w/ darbe 200 mcg every 1-2 wks as tolerated - pt got 2 different 1 gm loading courses of IV Fe in summer / fall 2024 - darbepoeitin SQ needs to ordered as "once"  or "once in dialysis", otherwise the order will default to "SQ at 1800" but that timing is not good because he typically needs to be ready to leave at 5 pm.   Plan: Most recent 200 mcg doses were given SQ on: - 01/04/24 and 01/18/24 - 02/03/24 - 02/29/24 and 03/21/24 2. Current labs include: Na 137, K+ 4.9, Ca 8.1, phos >30???, Mg 1.9, and Hgb 7.8. 3. Hb this week is in the 7.5- 8.5 range --> Ordered ESA dose (200mcg) to be given with HD today. I was informed ESA dose was NOT given on 04/02/24.  Jadene Maxwell, NP [facility]

## 2024-04-04 NOTE — ED Provider Notes (Signed)
 Patient returned from dialysis.  I attempted to evaluate patient however when I went to go see him he was not in the listed bed.  Appears patient has left.  He was not evaluated by me.   Lucina Sabal, PA-C 04/04/24 1438    Sueellen Emery, MD 04/04/24 1501

## 2024-04-04 NOTE — ED Notes (Signed)
 Pt was stuck 3x. Wasn't able to obtain labs.

## 2024-04-08 ENCOUNTER — Emergency Department (HOSPITAL_COMMUNITY)

## 2024-04-08 ENCOUNTER — Inpatient Hospital Stay (HOSPITAL_COMMUNITY)

## 2024-04-08 ENCOUNTER — Inpatient Hospital Stay (HOSPITAL_COMMUNITY)
Admission: EM | Admit: 2024-04-08 | Discharge: 2024-04-28 | DRG: 296 | Disposition: E | Attending: Pulmonary Disease | Admitting: Pulmonary Disease

## 2024-04-08 DIAGNOSIS — I10 Essential (primary) hypertension: Secondary | ICD-10-CM | POA: Diagnosis not present

## 2024-04-08 DIAGNOSIS — I5032 Chronic diastolic (congestive) heart failure: Secondary | ICD-10-CM | POA: Diagnosis present

## 2024-04-08 DIAGNOSIS — Z6841 Body Mass Index (BMI) 40.0 and over, adult: Secondary | ICD-10-CM

## 2024-04-08 DIAGNOSIS — Z9981 Dependence on supplemental oxygen: Secondary | ICD-10-CM

## 2024-04-08 DIAGNOSIS — I5030 Unspecified diastolic (congestive) heart failure: Secondary | ICD-10-CM

## 2024-04-08 DIAGNOSIS — E872 Acidosis, unspecified: Secondary | ICD-10-CM | POA: Diagnosis present

## 2024-04-08 DIAGNOSIS — E1165 Type 2 diabetes mellitus with hyperglycemia: Secondary | ICD-10-CM | POA: Diagnosis present

## 2024-04-08 DIAGNOSIS — J151 Pneumonia due to Pseudomonas: Secondary | ICD-10-CM | POA: Diagnosis not present

## 2024-04-08 DIAGNOSIS — J9621 Acute and chronic respiratory failure with hypoxia: Secondary | ICD-10-CM | POA: Diagnosis present

## 2024-04-08 DIAGNOSIS — J440 Chronic obstructive pulmonary disease with acute lower respiratory infection: Secondary | ICD-10-CM | POA: Diagnosis not present

## 2024-04-08 DIAGNOSIS — Z7902 Long term (current) use of antithrombotics/antiplatelets: Secondary | ICD-10-CM

## 2024-04-08 DIAGNOSIS — E871 Hypo-osmolality and hyponatremia: Secondary | ICD-10-CM | POA: Diagnosis not present

## 2024-04-08 DIAGNOSIS — Z7951 Long term (current) use of inhaled steroids: Secondary | ICD-10-CM

## 2024-04-08 DIAGNOSIS — N2581 Secondary hyperparathyroidism of renal origin: Secondary | ICD-10-CM | POA: Diagnosis present

## 2024-04-08 DIAGNOSIS — J9602 Acute respiratory failure with hypercapnia: Secondary | ICD-10-CM

## 2024-04-08 DIAGNOSIS — R0602 Shortness of breath: Secondary | ICD-10-CM | POA: Diagnosis present

## 2024-04-08 DIAGNOSIS — I469 Cardiac arrest, cause unspecified: Secondary | ICD-10-CM | POA: Diagnosis present

## 2024-04-08 DIAGNOSIS — G9382 Brain death: Secondary | ICD-10-CM | POA: Diagnosis not present

## 2024-04-08 DIAGNOSIS — N186 End stage renal disease: Secondary | ICD-10-CM | POA: Diagnosis present

## 2024-04-08 DIAGNOSIS — G9349 Other encephalopathy: Secondary | ICD-10-CM | POA: Diagnosis not present

## 2024-04-08 DIAGNOSIS — G9341 Metabolic encephalopathy: Secondary | ICD-10-CM | POA: Diagnosis present

## 2024-04-08 DIAGNOSIS — R570 Cardiogenic shock: Secondary | ICD-10-CM | POA: Diagnosis not present

## 2024-04-08 DIAGNOSIS — E441 Mild protein-calorie malnutrition: Secondary | ICD-10-CM | POA: Diagnosis present

## 2024-04-08 DIAGNOSIS — G4733 Obstructive sleep apnea (adult) (pediatric): Secondary | ICD-10-CM | POA: Diagnosis present

## 2024-04-08 DIAGNOSIS — Z5982 Transportation insecurity: Secondary | ICD-10-CM

## 2024-04-08 DIAGNOSIS — D631 Anemia in chronic kidney disease: Secondary | ICD-10-CM | POA: Diagnosis present

## 2024-04-08 DIAGNOSIS — R040 Epistaxis: Secondary | ICD-10-CM | POA: Diagnosis present

## 2024-04-08 DIAGNOSIS — Z91158 Patient's noncompliance with renal dialysis for other reason: Secondary | ICD-10-CM

## 2024-04-08 DIAGNOSIS — K7200 Acute and subacute hepatic failure without coma: Secondary | ICD-10-CM | POA: Diagnosis present

## 2024-04-08 DIAGNOSIS — Z89512 Acquired absence of left leg below knee: Secondary | ICD-10-CM

## 2024-04-08 DIAGNOSIS — E875 Hyperkalemia: Secondary | ICD-10-CM | POA: Diagnosis present

## 2024-04-08 DIAGNOSIS — G931 Anoxic brain damage, not elsewhere classified: Secondary | ICD-10-CM | POA: Diagnosis not present

## 2024-04-08 DIAGNOSIS — I132 Hypertensive heart and chronic kidney disease with heart failure and with stage 5 chronic kidney disease, or end stage renal disease: Secondary | ICD-10-CM | POA: Diagnosis present

## 2024-04-08 DIAGNOSIS — Z7189 Other specified counseling: Secondary | ICD-10-CM | POA: Diagnosis not present

## 2024-04-08 DIAGNOSIS — J9601 Acute respiratory failure with hypoxia: Secondary | ICD-10-CM | POA: Diagnosis not present

## 2024-04-08 DIAGNOSIS — L89616 Pressure-induced deep tissue damage of right heel: Secondary | ICD-10-CM | POA: Diagnosis present

## 2024-04-08 DIAGNOSIS — Z7982 Long term (current) use of aspirin: Secondary | ICD-10-CM

## 2024-04-08 DIAGNOSIS — Z794 Long term (current) use of insulin: Secondary | ICD-10-CM

## 2024-04-08 DIAGNOSIS — K219 Gastro-esophageal reflux disease without esophagitis: Secondary | ICD-10-CM | POA: Diagnosis present

## 2024-04-08 DIAGNOSIS — F4310 Post-traumatic stress disorder, unspecified: Secondary | ICD-10-CM | POA: Diagnosis present

## 2024-04-08 DIAGNOSIS — Z9911 Dependence on respirator [ventilator] status: Secondary | ICD-10-CM

## 2024-04-08 DIAGNOSIS — E66813 Obesity, class 3: Secondary | ICD-10-CM | POA: Diagnosis present

## 2024-04-08 DIAGNOSIS — E861 Hypovolemia: Secondary | ICD-10-CM | POA: Diagnosis not present

## 2024-04-08 DIAGNOSIS — E1122 Type 2 diabetes mellitus with diabetic chronic kidney disease: Secondary | ICD-10-CM | POA: Diagnosis present

## 2024-04-08 DIAGNOSIS — G935 Compression of brain: Secondary | ICD-10-CM | POA: Diagnosis not present

## 2024-04-08 DIAGNOSIS — G9340 Encephalopathy, unspecified: Secondary | ICD-10-CM | POA: Diagnosis not present

## 2024-04-08 DIAGNOSIS — D696 Thrombocytopenia, unspecified: Secondary | ICD-10-CM | POA: Diagnosis not present

## 2024-04-08 DIAGNOSIS — J449 Chronic obstructive pulmonary disease, unspecified: Secondary | ICD-10-CM | POA: Diagnosis not present

## 2024-04-08 DIAGNOSIS — I252 Old myocardial infarction: Secondary | ICD-10-CM

## 2024-04-08 DIAGNOSIS — Z91018 Allergy to other foods: Secondary | ICD-10-CM

## 2024-04-08 DIAGNOSIS — J810 Acute pulmonary edema: Secondary | ICD-10-CM | POA: Diagnosis not present

## 2024-04-08 DIAGNOSIS — Z96653 Presence of artificial knee joint, bilateral: Secondary | ICD-10-CM | POA: Diagnosis present

## 2024-04-08 DIAGNOSIS — Z66 Do not resuscitate: Secondary | ICD-10-CM | POA: Diagnosis not present

## 2024-04-08 DIAGNOSIS — J690 Pneumonitis due to inhalation of food and vomit: Secondary | ICD-10-CM | POA: Diagnosis present

## 2024-04-08 DIAGNOSIS — J9622 Acute and chronic respiratory failure with hypercapnia: Secondary | ICD-10-CM | POA: Diagnosis present

## 2024-04-08 DIAGNOSIS — E785 Hyperlipidemia, unspecified: Secondary | ICD-10-CM | POA: Diagnosis present

## 2024-04-08 DIAGNOSIS — Z888 Allergy status to other drugs, medicaments and biological substances status: Secondary | ICD-10-CM

## 2024-04-08 DIAGNOSIS — M1990 Unspecified osteoarthritis, unspecified site: Secondary | ICD-10-CM | POA: Diagnosis present

## 2024-04-08 DIAGNOSIS — Z992 Dependence on renal dialysis: Secondary | ICD-10-CM

## 2024-04-08 DIAGNOSIS — Z87891 Personal history of nicotine dependence: Secondary | ICD-10-CM

## 2024-04-08 DIAGNOSIS — Z79899 Other long term (current) drug therapy: Secondary | ICD-10-CM

## 2024-04-08 DIAGNOSIS — Z515 Encounter for palliative care: Secondary | ICD-10-CM | POA: Diagnosis not present

## 2024-04-08 DIAGNOSIS — G919 Hydrocephalus, unspecified: Secondary | ICD-10-CM | POA: Diagnosis not present

## 2024-04-08 DIAGNOSIS — R569 Unspecified convulsions: Secondary | ICD-10-CM | POA: Diagnosis not present

## 2024-04-08 DIAGNOSIS — Z886 Allergy status to analgesic agent status: Secondary | ICD-10-CM

## 2024-04-08 DIAGNOSIS — Z89612 Acquired absence of left leg above knee: Secondary | ICD-10-CM

## 2024-04-08 LAB — BLOOD GAS, ARTERIAL
Acid-base deficit: 1.4 mmol/L (ref 0.0–2.0)
Bicarbonate: 29.1 mmol/L — ABNORMAL HIGH (ref 20.0–28.0)
O2 Saturation: 99.5 %
Patient temperature: 37
pCO2 arterial: 78 mmHg (ref 32–48)
pH, Arterial: 7.18 — CL (ref 7.35–7.45)
pO2, Arterial: 222 mmHg — ABNORMAL HIGH (ref 83–108)

## 2024-04-08 LAB — I-STAT VENOUS BLOOD GAS, ED
Acid-base deficit: 6 mmol/L — ABNORMAL HIGH (ref 0.0–2.0)
Bicarbonate: 23.2 mmol/L (ref 20.0–28.0)
Calcium, Ion: 1.18 mmol/L (ref 1.15–1.40)
HCT: 25 % — ABNORMAL LOW (ref 39.0–52.0)
Hemoglobin: 8.5 g/dL — ABNORMAL LOW (ref 13.0–17.0)
O2 Saturation: 95 %
Potassium: 5.2 mmol/L — ABNORMAL HIGH (ref 3.5–5.1)
Sodium: 136 mmol/L (ref 135–145)
TCO2: 25 mmol/L (ref 22–32)
pCO2, Ven: 65.3 mmHg — ABNORMAL HIGH (ref 44–60)
pH, Ven: 7.158 — CL (ref 7.25–7.43)
pO2, Ven: 100 mmHg — ABNORMAL HIGH (ref 32–45)

## 2024-04-08 LAB — COMPREHENSIVE METABOLIC PANEL WITH GFR
ALT: 637 U/L — ABNORMAL HIGH (ref 0–44)
AST: 529 U/L — ABNORMAL HIGH (ref 15–41)
Albumin: 2.8 g/dL — ABNORMAL LOW (ref 3.5–5.0)
Alkaline Phosphatase: 92 U/L (ref 38–126)
Anion gap: 12 (ref 5–15)
BUN: 69 mg/dL — ABNORMAL HIGH (ref 8–23)
CO2: 24 mmol/L (ref 22–32)
Calcium: 8.9 mg/dL (ref 8.9–10.3)
Chloride: 100 mmol/L (ref 98–111)
Creatinine, Ser: 6.36 mg/dL — ABNORMAL HIGH (ref 0.61–1.24)
GFR, Estimated: 9 mL/min — ABNORMAL LOW (ref >60)
Glucose, Bld: 394 mg/dL — ABNORMAL HIGH (ref 70–99)
Potassium: 5.2 mmol/L — ABNORMAL HIGH (ref 3.5–5.1)
Sodium: 136 mmol/L (ref 135–145)
Total Bilirubin: 0.5 mg/dL (ref 0.0–1.2)
Total Protein: 5.8 g/dL — ABNORMAL LOW (ref 6.5–8.1)

## 2024-04-08 LAB — I-STAT ARTERIAL BLOOD GAS, ED
Acid-base deficit: 1 mmol/L (ref 0.0–2.0)
Bicarbonate: 28.7 mmol/L — ABNORMAL HIGH (ref 20.0–28.0)
Calcium, Ion: 1.27 mmol/L (ref 1.15–1.40)
HCT: 25 % — ABNORMAL LOW (ref 39.0–52.0)
Hemoglobin: 8.5 g/dL — ABNORMAL LOW (ref 13.0–17.0)
O2 Saturation: 100 %
Patient temperature: 98.6
Potassium: 5.4 mmol/L — ABNORMAL HIGH (ref 3.5–5.1)
Sodium: 136 mmol/L (ref 135–145)
TCO2: 31 mmol/L (ref 22–32)
pCO2 arterial: 83.1 mmHg (ref 32–48)
pH, Arterial: 7.146 — CL (ref 7.35–7.45)
pO2, Arterial: 284 mmHg — ABNORMAL HIGH (ref 83–108)

## 2024-04-08 LAB — I-STAT CHEM 8, ED
BUN: 76 mg/dL — ABNORMAL HIGH (ref 8–23)
Calcium, Ion: 1.19 mmol/L (ref 1.15–1.40)
Chloride: 102 mmol/L (ref 98–111)
Creatinine, Ser: 5.9 mg/dL — ABNORMAL HIGH (ref 0.61–1.24)
Glucose, Bld: 400 mg/dL — ABNORMAL HIGH (ref 70–99)
HCT: 26 % — ABNORMAL LOW (ref 39.0–52.0)
Hemoglobin: 8.8 g/dL — ABNORMAL LOW (ref 13.0–17.0)
Potassium: 5.3 mmol/L — ABNORMAL HIGH (ref 3.5–5.1)
Sodium: 136 mmol/L (ref 135–145)
TCO2: 25 mmol/L (ref 22–32)

## 2024-04-08 LAB — URINALYSIS, ROUTINE W REFLEX MICROSCOPIC
Bilirubin Urine: NEGATIVE
Glucose, UA: 150 mg/dL — AB
Ketones, ur: NEGATIVE mg/dL
Leukocytes,Ua: NEGATIVE
Nitrite: NEGATIVE
Protein, ur: >=300 mg/dL — AB
Specific Gravity, Urine: 1.01 (ref 1.005–1.030)
pH: 6 (ref 5.0–8.0)

## 2024-04-08 LAB — CBC WITH DIFFERENTIAL/PLATELET
Abs Immature Granulocytes: 0.37 10*3/uL — ABNORMAL HIGH (ref 0.00–0.07)
Basophils Absolute: 0 10*3/uL (ref 0.0–0.1)
Basophils Relative: 0 %
Eosinophils Absolute: 0.1 10*3/uL (ref 0.0–0.5)
Eosinophils Relative: 1 %
HCT: 29.2 % — ABNORMAL LOW (ref 39.0–52.0)
Hemoglobin: 8.5 g/dL — ABNORMAL LOW (ref 13.0–17.0)
Immature Granulocytes: 3 %
Lymphocytes Relative: 13 %
Lymphs Abs: 1.9 10*3/uL (ref 0.7–4.0)
MCH: 28.8 pg (ref 26.0–34.0)
MCHC: 29.1 g/dL — ABNORMAL LOW (ref 30.0–36.0)
MCV: 99 fL (ref 80.0–100.0)
Monocytes Absolute: 0.7 10*3/uL (ref 0.1–1.0)
Monocytes Relative: 5 %
Neutro Abs: 11 10*3/uL — ABNORMAL HIGH (ref 1.7–7.7)
Neutrophils Relative %: 78 %
Platelets: 127 10*3/uL — ABNORMAL LOW (ref 150–400)
RBC: 2.95 MIL/uL — ABNORMAL LOW (ref 4.22–5.81)
RDW: 15.1 % (ref 11.5–15.5)
WBC: 14 10*3/uL — ABNORMAL HIGH (ref 4.0–10.5)
nRBC: 0.7 % — ABNORMAL HIGH (ref 0.0–0.2)

## 2024-04-08 LAB — LACTIC ACID, PLASMA: Lactic Acid, Venous: 3.4 mmol/L (ref 0.5–1.9)

## 2024-04-08 LAB — BRAIN NATRIURETIC PEPTIDE: B Natriuretic Peptide: 273.1 pg/mL — ABNORMAL HIGH (ref 0.0–100.0)

## 2024-04-08 LAB — TROPONIN I (HIGH SENSITIVITY): Troponin I (High Sensitivity): 21 ng/L — ABNORMAL HIGH (ref <18)

## 2024-04-08 MED ORDER — SODIUM BICARBONATE 8.4 % IV SOLN
INTRAVENOUS | Status: AC | PRN
  Administered 2024-04-08 (×3): 50 meq via INTRAVENOUS

## 2024-04-08 MED ORDER — CALCIUM CHLORIDE 10 % IV SOLN
INTRAVENOUS | Status: AC | PRN
  Administered 2024-04-08 (×2): 1 g via INTRAVENOUS

## 2024-04-08 MED ORDER — VANCOMYCIN VARIABLE DOSE PER UNSTABLE RENAL FUNCTION (PHARMACIST DOSING): Status: DC

## 2024-04-08 MED ORDER — EPINEPHRINE 1 MG/10ML IJ SOSY
PREFILLED_SYRINGE | INTRAMUSCULAR | Status: AC | PRN
  Administered 2024-04-08 (×6): 1 mg via INTRAVENOUS

## 2024-04-08 MED ORDER — MAGNESIUM SULFATE 50 % IJ SOLN
INTRAMUSCULAR | Status: AC | PRN
  Administered 2024-04-08: 2 g via INTRAVENOUS

## 2024-04-08 MED ORDER — VANCOMYCIN HCL 2000 MG/400ML IV SOLN
2000.0000 mg | Freq: Once | INTRAVENOUS | Status: AC
  Administered 2024-04-09: 2000 mg via INTRAVENOUS
  Filled 2024-04-08: qty 400

## 2024-04-08 MED ORDER — ALBUTEROL (5 MG/ML) CONTINUOUS INHALATION SOLN
10.0000 mg/h | INHALATION_SOLUTION | Freq: Once | RESPIRATORY_TRACT | Status: DC
  Filled 2024-04-08: qty 20

## 2024-04-08 MED ORDER — IPRATROPIUM BROMIDE 0.02 % IN SOLN: 0.5000 mg | RESPIRATORY_TRACT | Status: AC

## 2024-04-08 MED ORDER — VASOPRESSIN 20 UNITS/100 ML INFUSION FOR SHOCK
0.0000 [IU]/min | INTRAVENOUS | Status: DC
  Administered 2024-04-08: 0.03 [IU]/min via INTRAVENOUS

## 2024-04-08 MED ORDER — FENTANYL CITRATE PF 50 MCG/ML IJ SOSY
50.0000 ug | PREFILLED_SYRINGE | Freq: Once | INTRAMUSCULAR | Status: AC
  Administered 2024-04-08: 50 ug via INTRAVENOUS
  Filled 2024-04-08: qty 1

## 2024-04-08 MED ORDER — NOREPINEPHRINE 4 MG/250ML-% IV SOLN
0.0000 ug/min | INTRAVENOUS | Status: DC
  Administered 2024-04-08: 10 ug/min via INTRAVENOUS

## 2024-04-08 MED ORDER — PROPOFOL 1000 MG/100ML IV EMUL
5.0000 ug/kg/min | INTRAVENOUS | Status: DC
  Administered 2024-04-08: 5 ug/kg/min via INTRAVENOUS
  Administered 2024-04-09: 30 ug/kg/min via INTRAVENOUS
  Administered 2024-04-09: 20 ug/kg/min via INTRAVENOUS
  Filled 2024-04-08 (×3): qty 100

## 2024-04-08 MED ORDER — EPINEPHRINE HCL 5 MG/250ML IV SOLN IN NS
0.5000 ug/min | INTRAVENOUS | Status: DC
  Administered 2024-04-08: 10 ug/min via INTRAVENOUS
  Administered 2024-04-08: 5 ug/min via INTRAVENOUS
  Administered 2024-04-08: 20 ug/min via INTRAVENOUS

## 2024-04-08 MED ORDER — TENECTEPLASE 50 MG IV KIT
50.0000 mg | PACK | Freq: Once | INTRAVENOUS | Status: AC
  Administered 2024-04-08: 50 mg via INTRAVENOUS
  Filled 2024-04-08: qty 10

## 2024-04-08 MED ORDER — IOHEXOL 350 MG/ML SOLN
75.0000 mL | Freq: Once | INTRAVENOUS | Status: AC | PRN
  Administered 2024-04-08: 75 mL via INTRAVENOUS

## 2024-04-08 MED ORDER — METHYLPREDNISOLONE SODIUM SUCC 125 MG IJ SOLR
125.0000 mg | INTRAMUSCULAR | Status: AC
  Administered 2024-04-08: 125 mg via INTRAVENOUS
  Filled 2024-04-08: qty 2

## 2024-04-08 MED ORDER — EPINEPHRINE 1 MG/10ML IJ SOSY
PREFILLED_SYRINGE | INTRAMUSCULAR | Status: AC | PRN
  Administered 2024-04-08 (×2): 1 mg via INTRAVENOUS

## 2024-04-08 MED ORDER — SODIUM CHLORIDE 0.9 % IV SOLN
2.0000 g | Freq: Two times a day (BID) | INTRAVENOUS | Status: DC
  Administered 2024-04-09: 2 g via INTRAVENOUS
  Filled 2024-04-08: qty 12.5

## 2024-04-08 NOTE — Code Documentation (Signed)
Pulse check. Femoral pulse present.

## 2024-04-08 NOTE — Progress Notes (Signed)
 Tube was pulled back 5cm per ED physcian & xray.

## 2024-04-08 NOTE — TOC CM/SW Note (Signed)
 SW responded to Trauma call, patient arrived receiving CPR after coding in EMS on arrival to ED. Patient has been experiencing SOB worsen over the last few days. Per EMS patient missed dialysis last week. SW did not meet with patient at beside due to patient being worked on by medical team. SW did reach out to daughter Margarett Shavers [phone] to update her the patient (her father) arrived to the ED and will be admitted. Margarett Shavers stated the patient gets transported to dialysis by insurance transportation and he attends diaylsis T Thrs and Sat. Patient is stabled.  .Tamyah Cutbirth, MSW, LCSWA Transition of Care  Clinical Social Worker (ED 3-11 Mon-Fri)  [phone]

## 2024-04-08 NOTE — Progress Notes (Addendum)
 Pharmacy Antibiotic Note Jeffrey [name] is a 63 y.o. male admitted on 04/08/2024 with concern for sepsis s/p cardiac arrest. PMH significant for ESRD on HD, missed  Pharmacy has been consulted for vancomycin  dosing.  Plan: Vancomycin  2g x 1 load Vancomycin  dose per levels unstable  F/u dialysis plans, infectious work up, and length of therapy     No data recorded.  Recent Labs  Lab 04/02/24 1215 04/04/24 0756 04/08/24 2150  WBC 4.9 4.3  --   CREATININE  --  5.68* 5.90*    Estimated Creatinine Clearance: 16 mL/min (A) (by C-G formula based on SCr of 5.9 mg/dL (H)).    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  Fonda Hymen 04/08/2024 10:13 PM

## 2024-04-08 NOTE — ED Triage Notes (Signed)
 Patient arrived CPR in progress , reports worsening SOB this evening , missed hemodialysis treatment last week .

## 2024-04-08 NOTE — Code Documentation (Signed)
 CPR restarted , no palpable pulse .

## 2024-04-08 NOTE — Code Documentation (Signed)
PEA, CPR continues

## 2024-04-08 NOTE — Code Documentation (Signed)
 Pulse check: no pulse , CPR continues.

## 2024-04-08 NOTE — Code Documentation (Signed)
Pulse check - no pulse /PEA , CPR continues.

## 2024-04-08 NOTE — ED Notes (Signed)
 Significant other Arletta Bender [phone] would like an update asap

## 2024-04-08 NOTE — Code Documentation (Signed)
 Chest compressions resumed , no pulse .

## 2024-04-08 NOTE — Code Documentation (Signed)
 Femoral/Carotid pulse present.

## 2024-04-08 NOTE — H&P (Signed)
 NAME:  Jeffrey [name], MRN:  [id number], DOB:  27-Jun-1961, LOS: 0 ADMISSION DATE:  04/08/2024, CONSULTATION DATE:  04/08/24 REFERRING MD:  EDP, CHIEF COMPLAINT:  cardiac arrest   History of Present Illness:  63 yo male poorly compliant with dialysis (leaves ama as well), presented to hospital for sessions of late; recent hospitalization late April for undetermined arrhythmia who called ems with resp distress found to be in pea and acls started with EMS services. Reportedly missed multiple sessions of dialysis last week. Per chart review pt was in PEA, undergoing ACLS for ~23 mins until ROSC and unfortunately deteriorated shortly after again in PEA ~10 mins with ROSC now on escalated pressors and inotropic support.   CXR revealed pulmonary edema. EDP was gracious to place CVC and arterial line for pt. Unable to reach family at this time. CCM was asked to admit pt. All history is obtained from chart review as pt is intubated and unresponsive.   Cth pending  Pt seen with R facial twitching, unresponsive on low dose dose propofol , no purposeful movement. On 20epi and 0.03 vasopressin . NE on hold. Pt was given TNK during code and has epistaxis and bleeding from line sites. Labs being sent now.   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 rate 18.    FiO2 (%):  [100 %] 100 %  No intake or output data in the 24 hours ending 04/08/24 2152 There were no vitals filed for this visit.  Examination: General: unresponsive on vent, morbidly obese HENT: [place], epistaxis ongoing, pupils are  3-77mm fixed, mmmp Lungs: distant, diminished in bases bilaterally  Cardio: distant heart sounds, irreg Abdomen: obese, large pannus, distant bs  Extremities: L AKA, RLE and b/l ue with edema, no withdrawal to pain Neuro: unresponsive on vent GU: deferred  Resolved problem list   Assessment and Plan  Acute ooh cardiac arrest s/p ROSC Acute on chronic hypoxic/hypercarbic resp failure req mechanical ventilation Shock, likely cardiogenic Sz like activity, ? 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 Will hold on formal consultation for now and perhaps order eeg in am -will need to consult nephrology for dialysis tomorrow, no acute indication for emergent dialysis at this time and would need access as his pressor requirement is too high to utilize fistula for now.  -ssi -titrate vent as able, higher peep 2/2 body habitus and pulmonary edema -no acute indication for transfusion at this time, checking DIC panel to replace factors if needed/ s/p tnk in field  Best Practice (right click and "Reselect all SmartList Selections" daily)   Diet/type: NPO DVT prophylaxis SCD on RLE, just given TNK during cardiac arrest Pressure ulcer(s): N/A GI prophylaxis: PPI Lines: Central line and Arterial Line Foley:  Yes, and it is still needed Code Status:  full  code Last date of multidisciplinary goals of care discussion [-- pending]  Labs   CBC: Recent Labs  Lab 04/02/24 1215 04/04/24 0756 04/08/24 2148  WBC 4.9 4.3  --   NEUTROABS 3.6 3.2  --   HGB 7.9* 7.8* 8.5*  HCT 25.9* 25.2* 25.0*  MCV 93.8 93.7  --   PLT 80* 108*  --     Basic Metabolic Panel: Recent Labs  Lab 04/02/24 1215 04/04/24 0756 04/08/24 2148  NA  --  137 136  K  --  4.9 5.2*  CL  --  98  --   CO2  --  26  --   GLUCOSE  --  195*  --   BUN  --  72*  --   CREATININE  --  5.68*  --   CALCIUM   --  8.1*  --   MG 2.0 1.9  --   PHOS  --  >30.0*  --    GFR: Estimated Creatinine Clearance: 16.7 mL/min (A) (by C-G formula based on SCr of 5.68 mg/dL (H)). Recent Labs  Lab 04/02/24 1215 04/04/24 0756  WBC 4.9 4.3    Liver Function Tests: Recent Labs  Lab 04/04/24 0756  ALBUMIN  3.3*   No results for input(s): "LIPASE", "AMYLASE" in the last 168 hours. No results for input(s): "AMMONIA" in the last 168 hours.  ABG    Component Value Date/Time   PHART 7.26 (L) 11/25/2022 0751   PCO2ART 72 (HH) 11/25/2022 0751   PO2ART 55 (L) 11/25/2022 0751   HCO3 23.2 04/08/2024 2148   TCO2 25 04/08/2024 2148   ACIDBASEDEF 6.0 (H) 04/08/2024 2148   O2SAT 95 04/08/2024 2148     Coagulation Profile: No results for input(s): "INR", "PROTIME" in the last 168 hours.  Cardiac Enzymes: No results for input(s): "CKTOTAL", "CKMB", "CKMBINDEX", "TROPONINI" in the last 168 hours.  HbA1C: Hgb A1c MFr Bld  Date/Time Value Ref Range Status  03/22/2024 04:38 AM 8.5 (H) 4.8 - 5.6 % Final    Comment:    (NOTE) Pre diabetes:          5.7%-6.4%  Diabetes:              >6.4%  Glycemic control for   <7.0% adults with diabetes   09/17/2023 02:27 AM 11.1 (H) 4.8 - 5.6 % Final    Comment:    (NOTE) Pre diabetes:          5.7%-6.4%  Diabetes:              >6.4%  Glycemic control for   <7.0% adults with diabetes     CBG: No results for input(s): "GLUCAP" in the last  168 hours.  Review of Systems:   Unobtainable 2/2 intubated status  Past Medical History:  He,  has a past medical history of Altered mental status (05/04/2021), Arthritis, Blood transfusion without reported diagnosis, CHF (congestive heart failure) (HCC), COPD (chronic obstructive pulmonary disease) (HCC), Depression, Diabetes mellitus without complication (HCC), Dyspnea, ESRD on hemodialysis (HCC), GERD (gastroesophageal reflux disease), Heart murmur, Hypertension, Myocardial infarction (HCC), PTSD (post-traumatic stress disorder), Sepsis (HCC), Sleep apnea, and Uses powered wheelchair.   Surgical History:   Past Surgical History:  Procedure Laterality Date   A/V FISTULAGRAM N/A 08/30/2021   Procedure: A/V FISTULAGRAM;  Surgeon: Adine Hoof, MD;  Location: [facility] INVASIVE CV LAB;  Service: Cardiovascular;  Laterality: N/A;   A/V FISTULAGRAM Right 07/28/2022   Procedure: A/V Fistulagram;  Surgeon: Adine Hoof, MD;  Location: [facility] INVASIVE CV LAB;  Service: Cardiovascular;  Laterality: Right;   A/V FISTULAGRAM Right 09/05/2022   Procedure: A/V Fistulagram;  Surgeon: Adine Hoof, MD;  Location: [facility] INVASIVE CV LAB;  Service: Cardiovascular;  Laterality: Right;   A/V FISTULAGRAM Right 10/31/2022   Procedure: A/V Fistulagram;  Surgeon: Adine Hoof, MD;  Location: [facility] INVASIVE CV LAB;  Service: Cardiovascular;  Laterality: Right;   A/V SHUNT INTERVENTION Right 10/31/2022   Procedure: A/V SHUNT INTERVENTION;  Surgeon: Adine Hoof, MD;  Location: [facility] INVASIVE CV LAB;  Service: Cardiovascular;  Laterality: Right;   A/V SHUNTOGRAM N/A 11/08/2021   Procedure: A/V SHUNTOGRAM;  Surgeon: Adine Hoof, MD;  Location: [facility] INVASIVE CV LAB;  Service: Cardiovascular;  Laterality: N/A;   AMPUTATION Left 04/03/2021   Procedure: LEFT ABOVE-THE-KNEE AMPUTATION;  Surgeon: Donnamarie Gables, MD;  Location: [facility] OR;  Service: Orthopedics;   Laterality: Left;   AV FISTULA PLACEMENT Right 08/12/2021   Procedure: INSERTION OF ARTERIOVENOUS (AV) GORE-TEX GRAFT RIGHT ARM;  Surgeon: Margherita Shell, MD;  Location: [facility] OR;  Service: Vascular;  Laterality: Right;   AV FISTULA PLACEMENT Left 04/07/2023   Procedure: ARTERIOVENOUS (AV) FISTULA GRAFT USING GORETEX STRETCH (4-7MM);  Surgeon: Dannis Dy, MD;  Location: [facility] OR;  Service: Vascular;  Laterality: Left;   INSERTION OF DIALYSIS CATHETER N/A 11/25/2022   Procedure: INSERTION OF DIALYSIS CATHETER;  Surgeon: Josiah Nigh, MD;  Location: [facility] ENDOSCOPY;  Service: Pulmonary;  Laterality: N/A;   INSERTION OF DIALYSIS CATHETER N/A 03/03/2023   Procedure: INSERTION OF TUNNELED DIALYSIS CATHETER;  Surgeon: Adine Hoof, MD;  Location: [facility] OR;  Service: Vascular;  Laterality: N/A;   INSERTION OF DIALYSIS CATHETER Left 04/07/2023   Procedure: INSERTION OF DIALYSIS CATHETER USING PALINDROME  28CM PRECISION CHRONIC CATHETER KIT;  Surgeon: Dannis Dy, MD;  Location: [facility] OR;  Service: Vascular;  Laterality: Left;   INSERTION OF DIALYSIS CATHETER Left 07/18/2023   Procedure: INSERTION OF TUNNELED DIALYSIS CATHETER USING 23cm PALINDROME CHRONIC CATHETER;  Surgeon: Dannis Dy, MD;  Location: [facility] OR;  Service: Vascular;  Laterality: Left;   IR REMOVAL TUN CV CATH W/O FL  03/30/2021   JOINT REPLACEMENT     Bilateral knees   LIGATION ARTERIOVENOUS GORTEX GRAFT Right 03/03/2023   Procedure: LIGATION RIGHT ARM ARTERIOVENOUS GORTEX GRAFT;  Surgeon: Adine Hoof, MD;  Location: [facility] OR;  Service: Vascular;  Laterality: Right;   PERIPHERAL VASCULAR BALLOON ANGIOPLASTY Right 08/30/2021   Procedure: PERIPHERAL VASCULAR BALLOON ANGIOPLASTY;  Surgeon: Adine Hoof, MD;  Location: [facility] INVASIVE CV LAB;  Service: Cardiovascular;  Laterality: Right;   PERIPHERAL VASCULAR BALLOON ANGIOPLASTY Right 07/28/2022   Procedure: PERIPHERAL VASCULAR BALLOON ANGIOPLASTY;   Surgeon: Adine Hoof, MD;  Location: [facility] INVASIVE CV LAB;  Service: Cardiovascular;  Laterality: Right;  AVF   PERIPHERAL VASCULAR INTERVENTION  11/08/2021   Procedure: PERIPHERAL VASCULAR INTERVENTION;  Surgeon: Adine Hoof, MD;  Location: [facility] INVASIVE CV LAB;  Service: Cardiovascular;;  Central rt arm fistula   UPPER EXTREMITY VENOGRAPHY Right 08/10/2021   Procedure: UPPER EXTREMITY VENOGRAPHY;  Surgeon: Margherita Shell, MD;  Location: [facility] INVASIVE CV LAB;  Service: Cardiovascular;  Laterality:  Right;     Social History:   reports that he quit smoking about 13 years ago. His smoking use included cigarettes. He has never used smokeless tobacco. He reports current alcohol  use. He reports that he does not currently use drugs.   Family History:  His family history is not on file.   Allergies Allergies  Allergen Reactions   Actos [Pioglitazone] Anaphylaxis and Rash   Dexmedetomidine  Nausea And Vomiting and Other (See Comments)    (Precedex ) Dose-limiting bradycardia     Ibuprofen Shortness Of Breath, Swelling and Other (See Comments)    Pt tolerates aspirin     Tomato Anaphylaxis and Other (See Comments)    Only allergic to RAW tomatoes   Wellbutrin [Bupropion] Swelling     Home Medications  Prior to Admission medications   Medication Sig Start Date End Date Taking? Authorizing Provider  acetaminophen  (TYLENOL ) 325 MG tablet Take 2 tablets (650 mg total) by mouth every 6 (six) hours. 07/23/23   Santa Cuba, MD  amLODipine  (NORVASC ) 10 MG tablet Take 10 mg by mouth every morning. 04/17/20   [provider]  ASPIRIN  LOW DOSE 81 MG EC tablet Take 81 mg by mouth in the morning. 02/22/21   [provider]  atorvastatin  (LIPITOR) 40 MG tablet Take 40 mg by mouth in the morning. 02/09/21   [provider]  calcium  acetate (PHOSLO ) 667 MG capsule Take 2 capsules (1,334 mg total) by mouth with breakfast, with lunch, and with evening meal.  12/21/23   Ejigiri, Juanda Noon, PA-C  carvedilol  (COREG ) 25 MG tablet Take 1 tablet (25 mg total) by mouth 2 (two) times daily with a meal. 03/26/24   Samtani, Jai-Gurmukh, MD  cyclobenzaprine  (FLEXERIL ) 10 MG tablet Take 10 mg by mouth 3 (three) times daily. 10/14/23   [provider]  diazepam  (VALIUM ) 5 MG tablet Take 5 mg by mouth 2 (two) times daily as needed for muscle spasms. 12/09/21   [provider]  HYDROcodone -acetaminophen  (NORCO/VICODIN) 5-325 MG tablet Take 1 tablet by mouth every 4 (four) hours as needed for moderate pain (pain score 4-6). 03/26/24   Samtani, Jai-Gurmukh, MD  insulin  lispro (HUMALOG  KWIKPEN) 100 UNIT/ML KwikPen Inject 0-9 Units into the skin 3 (three) times daily with meals. CBG < 70: treat low blood sugar CBG 70 - 120: 0 units  CBG 121 - 150: 1 unit  CBG 151 - 200: 2 units  CBG 201 - 250: 3 units  CBG 251 - 300: 5 units  CBG 301 - 350: 7 units  CBG 351 - 400: 9 units  CBG > 400: call MD 09/27/23   Etter Hermann., MD  insulin  regular human CONCENTRATED (HUMULIN  R U-500 KWIKPEN) 500 UNIT/ML KwikPen Inject 55 Units into the skin 2 (two) times daily with a meal. Follow up with your PCP and [facility] for additional adjustments 09/27/23 03/21/24  Etter Hermann., MD  iron  polysaccharides (NIFEREX) 150 MG capsule Take 1 capsule (150 mg total) by mouth daily. 09/26/23   Ozell Blunt, MD  nortriptyline  (PAMELOR ) 10 MG capsule Take 20 mg by mouth at bedtime.    [provider]  [facility] VERIO test strip 1 each by Other route as needed. 03/04/24   [provider]  pantoprazole  (PROTONIX ) 40 MG tablet Take 40 mg by mouth daily before breakfast. 02/09/21   [provider]  pregabalin  (LYRICA ) 100 MG capsule Take 100 mg by mouth. 03/20/24 04/19/24  [provider]  sertraline  (ZOLOFT )  100 MG tablet Take 100 mg by mouth in the morning. 02/22/21   [provider]  tamsulosin  (FLOMAX ) 0.4 MG CAPS capsule  Take 1 capsule (0.4 mg total) by mouth daily. 11/10/21   Modena Andes, MD  traZODone  (DESYREL ) 150 MG tablet Take 150 mg by mouth at bedtime. 12/09/21   [provider]  TRELEGY ELLIPTA 100-62.5-25 MCG/ACT AEPB Take 1 puff by mouth daily. 02/19/24   [provider]  Vitamin D , Ergocalciferol , (DRISDOL ) 1.25 MG (50000 UNIT) CAPS capsule Take 1 capsule (50,000 Units total) by mouth every 7 (seven) days. Patient not taking: Reported on 03/21/2024 10/02/23   Ozell Blunt, MD     Critical care time: 

## 2024-04-08 NOTE — Code Documentation (Signed)
Pulse check no pulse - PEA 

## 2024-04-08 NOTE — ED Provider Notes (Signed)
 [place] [facility] Provider Note   CSN: [id number] Arrival date & time: 04/08/24  2056     History {Add pertinent medical, surgical, social history, OB history to HPI:1} Chief Complaint  Patient presents with   CPR    Jeffrey [name] is a 63 y.o. male.  HPI     Home Medications Prior to Admission medications   Medication Sig Start Date End Date Taking? Authorizing Provider  acetaminophen  (TYLENOL ) 325 MG tablet Take 2 tablets (650 mg total) by mouth every 6 (six) hours. 07/23/23   Santa Cuba, MD  amLODipine  (NORVASC ) 10 MG tablet Take 10 mg by mouth every morning. 04/17/20   [provider]  ASPIRIN  LOW DOSE 81 MG EC tablet Take 81 mg by mouth in the morning. 02/22/21   [provider]  atorvastatin  (LIPITOR) 40 MG tablet Take 40 mg by mouth in the morning. 02/09/21   [provider]  calcium  acetate (PHOSLO ) 667 MG capsule Take 2 capsules (1,334 mg total) by mouth with breakfast, with lunch, and with evening meal. 12/21/23   Ejigiri, Juanda Noon, PA-C  carvedilol  (COREG ) 25 MG tablet Take 1 tablet (25 mg total) by mouth 2 (two) times daily with a meal. 03/26/24   Samtani, Jai-Gurmukh, MD  cyclobenzaprine  (FLEXERIL ) 10 MG tablet Take 10 mg by mouth 3 (three) times daily. 10/14/23   [provider]  diazepam  (VALIUM ) 5 MG tablet Take 5 mg by mouth 2 (two) times daily as needed for muscle spasms. 12/09/21   [provider]  HYDROcodone -acetaminophen  (NORCO/VICODIN) 5-325 MG tablet Take 1 tablet by mouth every 4 (four) hours as needed for moderate pain (pain score 4-6). 03/26/24   Samtani, Jai-Gurmukh, MD  insulin  lispro (HUMALOG  KWIKPEN) 100 UNIT/ML KwikPen Inject 0-9 Units into the skin 3 (three) times daily with meals. CBG < 70: treat low blood sugar CBG 70 - 120: 0 units  CBG 121 - 150: 1 unit  CBG 151 - 200: 2 units  CBG 201 - 250: 3 units  CBG 251 - 300: 5 units  CBG 301 - 350: 7 units  CBG 351 -  400: 9 units  CBG > 400: call MD 09/27/23   Etter Hermann., MD  insulin  regular human CONCENTRATED (HUMULIN  R U-500 KWIKPEN) 500 UNIT/ML KwikPen Inject 55 Units into the skin 2 (two) times daily with a meal. Follow up with your PCP and [facility] for additional adjustments 09/27/23 03/21/24  Etter Hermann., MD  iron  polysaccharides (NIFEREX) 150 MG capsule Take 1 capsule (150 mg total) by mouth daily. 09/26/23   Ozell Blunt, MD  nortriptyline  (PAMELOR ) 10 MG capsule Take 20 mg by mouth at bedtime.    [provider]  [facility] VERIO test strip 1 each by Other route as needed. 03/04/24   [provider]  pantoprazole  (PROTONIX ) 40 MG tablet Take 40 mg by mouth daily before breakfast. 02/09/21   [provider]  pregabalin  (LYRICA ) 100 MG capsule Take 100 mg by mouth. 03/20/24 04/19/24  [provider]  sertraline  (ZOLOFT ) 100 MG tablet Take 100 mg by mouth in the morning. 02/22/21   [provider]  tamsulosin  (FLOMAX ) 0.4 MG CAPS capsule Take 1 capsule (0.4 mg total) by mouth daily. 11/10/21   Modena Andes, MD  traZODone  (DESYREL ) 150 MG tablet Take 150 mg by mouth at bedtime. 12/09/21   [provider]  TRELEGY ELLIPTA 100-62.5-25 MCG/ACT AEPB Take 1 puff by mouth  daily. 02/19/24   [provider]  Vitamin D , Ergocalciferol , (DRISDOL ) 1.25 MG (50000 UNIT) CAPS capsule Take 1 capsule (50,000 Units total) by mouth every 7 (seven) days. Patient not taking: Reported on 03/21/2024 10/02/23   Ozell Blunt, MD      Allergies    Actos [pioglitazone], Dexmedetomidine , Ibuprofen, Tomato, and Wellbutrin [bupropion]    Review of Systems   Review of Systems  Physical Exam Updated Vital Signs BP (!) 234/89   Pulse 70   Resp 15  Physical Exam  ED Results / Procedures / Treatments   Labs (all labs ordered are listed, but only abnormal results are displayed) Labs Reviewed  I-STAT VENOUS BLOOD GAS, ED - Abnormal; Notable for  the following components:      Result Value   pH, Ven 7.158 (*)    pCO2, Ven 65.3 (*)    pO2, Ven 100 (*)    Acid-base deficit 6.0 (*)    Potassium 5.2 (*)    HCT 25.0 (*)    Hemoglobin 8.5 (*)    All other components within normal limits  I-STAT CHEM 8, ED - Abnormal; Notable for the following components:   Potassium 5.3 (*)    BUN 76 (*)    Creatinine, Ser 5.90 (*)    Glucose, Bld 400 (*)    Hemoglobin 8.8 (*)    HCT 26.0 (*)    All other components within normal limits  CBC WITH DIFFERENTIAL/PLATELET  COMPREHENSIVE METABOLIC PANEL WITH GFR  BRAIN NATRIURETIC PEPTIDE  CBG MONITORING, ED  TROPONIN I (HIGH SENSITIVITY)    EKG None  Radiology No results found.  Procedures Procedures  {Document cardiac monitor, telemetry assessment procedure when appropriate:1}  Medications Ordered in ED Medications  EPINEPHrine  (ADRENALIN ) 1 MG/10ML injection (1 mg Intravenous Given 04/08/24 2140)  sodium bicarbonate injection (50 mEq Intravenous Given 04/08/24 2141)  calcium  chloride injection (1 g Intravenous Given 04/08/24 2105)  EPINEPHrine  (ADRENALIN ) 1 MG/10ML injection (1 mg Intravenous Given 04/08/24 2117)  magnesium  sulfate (IV Push/IM) injection (2 g Intravenous Given 04/08/24 2104)  EPINEPHrine  (ADRENALIN ) 5 mg in NS 250 mL (0.02 mg/mL) premix infusion (20 mcg/min Intravenous New Bag/Given 04/08/24 2143)  vasopressin  (PITRESSIN) 20 Units in 100 mL (0.2 unit/mL) infusion-*FOR SHOCK* (0.03 Units/min Intravenous New Bag/Given 04/08/24 2133)  norepinephrine (LEVOPHED) 4mg  in (0.016 mg/mL) premix infusion (0 mcg/min Intravenous Stopped 04/08/24 2157)  tenecteplase (TNKASE) injection for PE/MI 50 mg (50 mg Intravenous Given 04/08/24 2110)    ED Course/ Medical Decision Making/ A&P   {   Click here for ABCD2, HEART and other calculatorsREFRESH Note before signing :1}                              Medical Decision Making Amount and/or Complexity of Data Reviewed Labs:  ordered. Radiology: ordered.  Risk Prescription drug management.   ***  {Document critical care time when appropriate:1} {Document review of labs and clinical decision tools ie heart score, Chads2Vasc2 etc:1}  {Document your independent review of radiology images, and any outside records:1} {Document your discussion with family members, caretakers, and with consultants:1} {Document social determinants of health affecting pt's care:1} {Document your decision making why or why not admission, treatments were needed:1} Final Clinical Impression(s) / ED Diagnoses Final diagnoses:  None    Rx / DC Orders ED Discharge Orders     None

## 2024-04-08 NOTE — Code Documentation (Signed)
 CPR continues.

## 2024-04-08 NOTE — Code Documentation (Signed)
 No pulse / PEA, CPR continues.

## 2024-04-08 NOTE — Code Documentation (Addendum)
PEA, CPR continues

## 2024-04-09 ENCOUNTER — Inpatient Hospital Stay (HOSPITAL_COMMUNITY)

## 2024-04-09 ENCOUNTER — Encounter (HOSPITAL_COMMUNITY): Payer: Self-pay | Admitting: Critical Care Medicine

## 2024-04-09 DIAGNOSIS — Z515 Encounter for palliative care: Secondary | ICD-10-CM

## 2024-04-09 DIAGNOSIS — I469 Cardiac arrest, cause unspecified: Secondary | ICD-10-CM

## 2024-04-09 DIAGNOSIS — J449 Chronic obstructive pulmonary disease, unspecified: Secondary | ICD-10-CM | POA: Diagnosis not present

## 2024-04-09 DIAGNOSIS — N186 End stage renal disease: Secondary | ICD-10-CM | POA: Diagnosis not present

## 2024-04-09 DIAGNOSIS — J9602 Acute respiratory failure with hypercapnia: Secondary | ICD-10-CM

## 2024-04-09 DIAGNOSIS — Z7189 Other specified counseling: Secondary | ICD-10-CM

## 2024-04-09 DIAGNOSIS — R570 Cardiogenic shock: Secondary | ICD-10-CM

## 2024-04-09 DIAGNOSIS — R569 Unspecified convulsions: Secondary | ICD-10-CM

## 2024-04-09 DIAGNOSIS — J9621 Acute and chronic respiratory failure with hypoxia: Secondary | ICD-10-CM | POA: Diagnosis not present

## 2024-04-09 DIAGNOSIS — K7200 Acute and subacute hepatic failure without coma: Secondary | ICD-10-CM

## 2024-04-09 LAB — CBC
HCT: 27.9 % — ABNORMAL LOW (ref 39.0–52.0)
Hemoglobin: 8.4 g/dL — ABNORMAL LOW (ref 13.0–17.0)
MCH: 28.6 pg (ref 26.0–34.0)
MCHC: 30.1 g/dL (ref 30.0–36.0)
MCV: 94.9 fL (ref 80.0–100.0)
Platelets: 124 10*3/uL — ABNORMAL LOW (ref 150–400)
RBC: 2.94 MIL/uL — ABNORMAL LOW (ref 4.22–5.81)
RDW: 15.1 % (ref 11.5–15.5)
WBC: 9.4 10*3/uL (ref 4.0–10.5)
nRBC: 0.2 % (ref 0.0–0.2)

## 2024-04-09 LAB — ECHOCARDIOGRAM LIMITED
Height: 65 in
Weight: 4931.25 [oz_av]

## 2024-04-09 LAB — BASIC METABOLIC PANEL WITH GFR
Anion gap: 11 (ref 5–15)
BUN: 75 mg/dL — ABNORMAL HIGH (ref 8–23)
CO2: 25 mmol/L (ref 22–32)
Calcium: 8.4 mg/dL — ABNORMAL LOW (ref 8.9–10.3)
Chloride: 98 mmol/L (ref 98–111)
Creatinine, Ser: 6.47 mg/dL — ABNORMAL HIGH (ref 0.61–1.24)
GFR, Estimated: 9 mL/min — ABNORMAL LOW (ref >60)
Glucose, Bld: 426 mg/dL — ABNORMAL HIGH (ref 70–99)
Potassium: 7.1 mmol/L (ref 3.5–5.1)
Sodium: 134 mmol/L — ABNORMAL LOW (ref 135–145)

## 2024-04-09 LAB — GLUCOSE, CAPILLARY
Glucose-Capillary: 157 mg/dL — ABNORMAL HIGH (ref 70–99)
Glucose-Capillary: 162 mg/dL — ABNORMAL HIGH (ref 70–99)
Glucose-Capillary: 175 mg/dL — ABNORMAL HIGH (ref 70–99)
Glucose-Capillary: 175 mg/dL — ABNORMAL HIGH (ref 70–99)
Glucose-Capillary: 208 mg/dL — ABNORMAL HIGH (ref 70–99)
Glucose-Capillary: 208 mg/dL — ABNORMAL HIGH (ref 70–99)
Glucose-Capillary: 211 mg/dL — ABNORMAL HIGH (ref 70–99)
Glucose-Capillary: 223 mg/dL — ABNORMAL HIGH (ref 70–99)
Glucose-Capillary: 227 mg/dL — ABNORMAL HIGH (ref 70–99)
Glucose-Capillary: 257 mg/dL — ABNORMAL HIGH (ref 70–99)
Glucose-Capillary: 266 mg/dL — ABNORMAL HIGH (ref 70–99)
Glucose-Capillary: 284 mg/dL — ABNORMAL HIGH (ref 70–99)
Glucose-Capillary: 302 mg/dL — ABNORMAL HIGH (ref 70–99)
Glucose-Capillary: 330 mg/dL — ABNORMAL HIGH (ref 70–99)
Glucose-Capillary: 360 mg/dL — ABNORMAL HIGH (ref 70–99)
Glucose-Capillary: 361 mg/dL — ABNORMAL HIGH (ref 70–99)

## 2024-04-09 LAB — POCT I-STAT 7, (LYTES, BLD GAS, ICA,H+H)
Acid-Base Excess: 0 mmol/L (ref 0.0–2.0)
Bicarbonate: 27.1 mmol/L (ref 20.0–28.0)
Calcium, Ion: 1.16 mmol/L (ref 1.15–1.40)
HCT: 24 % — ABNORMAL LOW (ref 39.0–52.0)
Hemoglobin: 8.2 g/dL — ABNORMAL LOW (ref 13.0–17.0)
O2 Saturation: 93 %
Patient temperature: 36.5
Potassium: 6.3 mmol/L (ref 3.5–5.1)
Sodium: 134 mmol/L — ABNORMAL LOW (ref 135–145)
TCO2: 29 mmol/L (ref 22–32)
pCO2 arterial: 55.8 mmHg — ABNORMAL HIGH (ref 32–48)
pH, Arterial: 7.292 — ABNORMAL LOW (ref 7.35–7.45)
pO2, Arterial: 75 mmHg — ABNORMAL LOW (ref 83–108)

## 2024-04-09 LAB — MRSA NEXT GEN BY PCR, NASAL: MRSA by PCR Next Gen: DETECTED — AB

## 2024-04-09 LAB — MAGNESIUM
Magnesium: 2.1 mg/dL (ref 1.7–2.4)
Magnesium: 1.8 mg/dL (ref 1.7–2.4)

## 2024-04-09 LAB — HIV ANTIBODY (ROUTINE TESTING W REFLEX): HIV Screen 4th Generation wRfx: NONREACTIVE

## 2024-04-09 LAB — DIC (DISSEMINATED INTRAVASCULAR COAGULATION)PANEL
D-Dimer, Quant: >20 ug{FEU}/mL — ABNORMAL HIGH (ref 0.00–0.50)
Fibrinogen: 401 mg/dL (ref 210–475)
INR: 1.5 — ABNORMAL HIGH (ref 0.8–1.2)
Platelets: 121 10*3/uL — ABNORMAL LOW (ref 150–400)
Prothrombin Time: 18.2 s — ABNORMAL HIGH (ref 11.4–15.2)
Smear Review: NONE SEEN
aPTT: 47 s — ABNORMAL HIGH (ref 24–36)

## 2024-04-09 LAB — TROPONIN I (HIGH SENSITIVITY)
Troponin I (High Sensitivity): 129 ng/L (ref <18)
Troponin I (High Sensitivity): 105 ng/L (ref <18)

## 2024-04-09 LAB — HEPATITIS B SURFACE ANTIGEN: Hepatitis B Surface Ag: NONREACTIVE

## 2024-04-09 LAB — CREATININE, SERUM
Creatinine, Ser: 6.56 mg/dL — ABNORMAL HIGH (ref 0.61–1.24)
GFR, Estimated: 9 mL/min — ABNORMAL LOW (ref >60)

## 2024-04-09 LAB — LACTIC ACID, PLASMA: Lactic Acid, Venous: 1.1 mmol/L (ref 0.5–1.9)

## 2024-04-09 LAB — PHOSPHORUS: Phosphorus: 4 mg/dL (ref 2.5–4.6)

## 2024-04-09 MED ORDER — ACETAMINOPHEN 650 MG RE SUPP: 650.0000 mg | RECTAL | Status: AC

## 2024-04-09 MED ORDER — PROPOFOL 1000 MG/100ML IV EMUL
0.0000 ug/kg/min | INTRAVENOUS | Status: DC
  Administered 2024-04-09: 20 ug/kg/min via INTRAVENOUS
  Administered 2024-04-10: 5 ug/kg/min via INTRAVENOUS
  Filled 2024-04-09: qty 100

## 2024-04-09 MED ORDER — DOCUSATE SODIUM 50 MG/5ML PO LIQD
100.0000 mg | Freq: Two times a day (BID) | ORAL | Status: DC
  Administered 2024-04-09 – 2024-04-13 (×8): 100 mg
  Filled 2024-04-09 (×10): qty 10

## 2024-04-09 MED ORDER — PANTOPRAZOLE SODIUM 40 MG PO TBEC: 40.0000 mg | DELAYED_RELEASE_TABLET | Freq: Every day | ORAL | Status: DC

## 2024-04-09 MED ORDER — INSULIN REGULAR(HUMAN) IN NACL 100-0.9 UT/100ML-% IV SOLN
INTRAVENOUS | Status: DC
  Administered 2024-04-09: 9.5 [IU]/h via INTRAVENOUS
  Administered 2024-04-09: 11.5 [IU]/h via INTRAVENOUS
  Administered 2024-04-10: 13 [IU]/h via INTRAVENOUS
  Administered 2024-04-10: 11.5 [IU]/h via INTRAVENOUS
  Filled 2024-04-09 (×4): qty 100

## 2024-04-09 MED ORDER — ONDANSETRON HCL 4 MG/2ML IJ SOLN: 4.0000 mg | Freq: Four times a day (QID) | INTRAMUSCULAR | Status: DC | PRN

## 2024-04-09 MED ORDER — LEVETIRACETAM (KEPPRA) 500 MG/5 ML ADULT IV PUSH
500.0000 mg | Freq: Two times a day (BID) | INTRAVENOUS | Status: DC
  Administered 2024-04-09 – 2024-04-12 (×7): 500 mg via INTRAVENOUS
  Filled 2024-04-09 (×7): qty 5

## 2024-04-09 MED ORDER — HEPARIN SODIUM (PORCINE) 5000 UNIT/ML IJ SOLN: 5000.0000 [IU] | Freq: Three times a day (TID) | INTRAMUSCULAR | Status: DC

## 2024-04-09 MED ORDER — CHLORHEXIDINE GLUCONATE CLOTH 2 % EX PADS
6.0000 | MEDICATED_PAD | Freq: Every day | CUTANEOUS | Status: DC
  Administered 2024-04-09 – 2024-04-14 (×6): 6 via TOPICAL

## 2024-04-09 MED ORDER — DOCUSATE SODIUM 100 MG PO CAPS: 100.0000 mg | ORAL_CAPSULE | Freq: Two times a day (BID) | ORAL | Status: DC | PRN

## 2024-04-09 MED ORDER — PERFLUTREN LIPID MICROSPHERE
1.0000 mL | INTRAVENOUS | Status: AC | PRN
  Administered 2024-04-09: 2 mL via INTRAVENOUS

## 2024-04-09 MED ORDER — PANTOPRAZOLE SODIUM 40 MG IV SOLR
40.0000 mg | INTRAVENOUS | Status: DC
  Administered 2024-04-09 – 2024-04-15 (×7): 40 mg via INTRAVENOUS
  Filled 2024-04-09 (×7): qty 10

## 2024-04-09 MED ORDER — OXIDIZED CELLULOSE EX PADS
1.0000 | MEDICATED_PAD | Freq: Once | CUTANEOUS | Status: AC
  Administered 2024-04-09: 1 via TOPICAL
  Filled 2024-04-09: qty 1

## 2024-04-09 MED ORDER — BUSPIRONE HCL 15 MG PO TABS: 30.0000 mg | ORAL_TABLET | Freq: Three times a day (TID) | ORAL | Status: AC | PRN

## 2024-04-09 MED ORDER — SODIUM BICARBONATE 8.4 % IV SOLN
50.0000 meq | Freq: Once | INTRAVENOUS | Status: AC
  Administered 2024-04-09: 50 meq via INTRAVENOUS
  Filled 2024-04-09: qty 50

## 2024-04-09 MED ORDER — ACETAMINOPHEN 650 MG RE SUPP: 650.0000 mg | RECTAL | Status: DC | PRN

## 2024-04-09 MED ORDER — ACETAMINOPHEN 325 MG PO TABS
650.0000 mg | ORAL_TABLET | ORAL | Status: AC
  Administered 2024-04-10 (×2): 650 mg
  Filled 2024-04-09 (×3): qty 2

## 2024-04-09 MED ORDER — ORAL CARE MOUTH RINSE: 15.0000 mL | OROMUCOSAL | Status: DC | PRN

## 2024-04-09 MED ORDER — CHLORHEXIDINE GLUCONATE CLOTH 2 % EX PADS
6.0000 | MEDICATED_PAD | Freq: Every day | CUTANEOUS | Status: DC
  Administered 2024-04-09 – 2024-04-10 (×2): 6 via TOPICAL

## 2024-04-09 MED ORDER — DEXTROSE 5 % IV SOLN: INTRAVENOUS | Status: DC

## 2024-04-09 MED ORDER — MUPIROCIN 2 % EX OINT
TOPICAL_OINTMENT | Freq: Two times a day (BID) | CUTANEOUS | Status: DC
  Administered 2024-04-10 – 2024-04-14 (×7): 1 via NASAL
  Filled 2024-04-09 (×2): qty 22

## 2024-04-09 MED ORDER — NICARDIPINE HCL IN NACL 20-0.86 MG/200ML-% IV SOLN
3.0000 mg/h | INTRAVENOUS | Status: DC
  Administered 2024-04-09: 5 mg/h via INTRAVENOUS
  Administered 2024-04-10 (×2): 3 mg/h via INTRAVENOUS
  Filled 2024-04-09 (×4): qty 200

## 2024-04-09 MED ORDER — ACETAMINOPHEN 325 MG PO TABS: 650.0000 mg | ORAL_TABLET | ORAL | Status: DC | PRN

## 2024-04-09 MED ORDER — ORAL CARE MOUTH RINSE
15.0000 mL | OROMUCOSAL | Status: DC
  Administered 2024-04-09 – 2024-04-16 (×81): 15 mL via OROMUCOSAL

## 2024-04-09 MED ORDER — INSULIN ASPART 100 UNIT/ML IV SOLN
10.0000 [IU] | Freq: Once | INTRAVENOUS | Status: AC
  Administered 2024-04-09: 10 [IU] via INTRAVENOUS

## 2024-04-09 MED ORDER — FENTANYL 2500MCG IN NS 250ML (10MCG/ML) PREMIX INFUSION
50.0000 ug/h | INTRAVENOUS | Status: DC
  Administered 2024-04-09: 50 ug/h via INTRAVENOUS
  Filled 2024-04-09: qty 250

## 2024-04-09 MED ORDER — POLYETHYLENE GLYCOL 3350 17 G PO PACK: 17.0000 g | PACK | Freq: Every day | ORAL | Status: DC | PRN

## 2024-04-09 MED ORDER — VANCOMYCIN HCL IN DEXTROSE 1-5 GM/200ML-% IV SOLN
1000.0000 mg | Freq: Once | INTRAVENOUS | Status: AC
  Administered 2024-04-09: 1000 mg via INTRAVENOUS
  Filled 2024-04-09: qty 200

## 2024-04-09 MED ORDER — CEFEPIME HCL 1 G IJ SOLR
1.0000 g | INTRAMUSCULAR | Status: AC
  Administered 2024-04-09 – 2024-04-15 (×7): 1 g via INTRAVENOUS
  Filled 2024-04-09 (×8): qty 10

## 2024-04-09 MED ORDER — SODIUM CHLORIDE 0.9% FLUSH
3.0000 mL | Freq: Two times a day (BID) | INTRAVENOUS | Status: DC
  Administered 2024-04-09 – 2024-04-10 (×3): 10 mL via INTRAVENOUS
  Administered 2024-04-11: 3 mL via INTRAVENOUS
  Administered 2024-04-11 – 2024-04-15 (×9): 10 mL via INTRAVENOUS

## 2024-04-09 MED ORDER — SODIUM ZIRCONIUM CYCLOSILICATE 10 G PO PACK
10.0000 g | PACK | Freq: Once | ORAL | Status: AC
  Administered 2024-04-09: 10 g
  Filled 2024-04-09: qty 1

## 2024-04-09 MED ORDER — AMLODIPINE BESYLATE 10 MG PO TABS
10.0000 mg | ORAL_TABLET | Freq: Every day | ORAL | Status: DC
  Administered 2024-04-09 – 2024-04-10 (×2): 10 mg
  Filled 2024-04-09 (×2): qty 1

## 2024-04-09 MED ORDER — HEPARIN SODIUM (PORCINE) 5000 UNIT/ML IJ SOLN
5000.0000 [IU] | Freq: Three times a day (TID) | INTRAMUSCULAR | Status: DC
  Administered 2024-04-09 – 2024-04-15 (×20): 5000 [IU] via SUBCUTANEOUS
  Filled 2024-04-09 (×21): qty 1

## 2024-04-09 MED ORDER — SODIUM CHLORIDE 0.9% FLUSH: 3.0000 mL | INTRAVENOUS | Status: DC | PRN

## 2024-04-09 MED ORDER — CALCIUM GLUCONATE-NACL 1-0.675 GM/50ML-% IV SOLN
1.0000 g | Freq: Once | INTRAVENOUS | Status: AC
  Administered 2024-04-09: 1000 mg via INTRAVENOUS
  Filled 2024-04-09: qty 50

## 2024-04-09 MED ORDER — INSULIN ASPART 100 UNIT/ML IJ SOLN
0.0000 [IU] | INTRAMUSCULAR | Status: DC
  Administered 2024-04-09: 15 [IU] via SUBCUTANEOUS
  Administered 2024-04-09: 11 [IU] via SUBCUTANEOUS

## 2024-04-09 MED ORDER — DOCUSATE SODIUM 50 MG/5ML PO LIQD: 100.0000 mg | Freq: Every day | ORAL | Status: DC | PRN

## 2024-04-09 MED ORDER — IPRATROPIUM-ALBUTEROL 0.5-2.5 (3) MG/3ML IN SOLN: 3.0000 mL | Freq: Four times a day (QID) | RESPIRATORY_TRACT | Status: DC | PRN

## 2024-04-09 MED ORDER — FENTANYL BOLUS VIA INFUSION: 50.0000 ug | INTRAVENOUS | Status: DC | PRN

## 2024-04-09 MED ORDER — POLYETHYLENE GLYCOL 3350 17 G PO PACK
17.0000 g | PACK | Freq: Every day | ORAL | Status: DC
  Administered 2024-04-09 – 2024-04-13 (×4): 17 g
  Filled 2024-04-09 (×6): qty 1

## 2024-04-09 MED ORDER — NOREPINEPHRINE 4 MG/250ML-% IV SOLN
2.0000 ug/min | INTRAVENOUS | Status: DC
  Administered 2024-04-09: 2 ug/min via INTRAVENOUS

## 2024-04-09 MED ORDER — MAGNESIUM SULFATE 2 GM/50ML IV SOLN
2.0000 g | Freq: Once | INTRAVENOUS | Status: AC | PRN
  Administered 2024-04-10: 2 g via INTRAVENOUS
  Filled 2024-04-09: qty 50

## 2024-04-09 MED ORDER — ACETAMINOPHEN 160 MG/5ML PO SOLN: 650.0000 mg | ORAL | Status: DC | PRN

## 2024-04-09 MED ORDER — FENTANYL CITRATE PF 50 MCG/ML IJ SOSY: 50.0000 ug | PREFILLED_SYRINGE | Freq: Once | INTRAMUSCULAR | Status: DC

## 2024-04-09 MED ORDER — VITAL HIGH PROTEIN PO LIQD
1000.0000 mL | ORAL | Status: DC
  Administered 2024-04-09 – 2024-04-10 (×2): 1000 mL

## 2024-04-09 MED ORDER — CARVEDILOL 25 MG PO TABS
25.0000 mg | ORAL_TABLET | Freq: Two times a day (BID) | ORAL | Status: DC
  Administered 2024-04-09 – 2024-04-10 (×3): 25 mg
  Filled 2024-04-09 (×3): qty 1

## 2024-04-09 MED ORDER — PROSOURCE TF20 ENFIT COMPATIBL EN LIQD
60.0000 mL | Freq: Every day | ENTERAL | Status: DC
  Administered 2024-04-09 – 2024-04-10 (×2): 60 mL
  Filled 2024-04-09 (×2): qty 60

## 2024-04-09 MED ORDER — NITROGLYCERIN IN D5W 200-5 MCG/ML-% IV SOLN
0.0000 ug/min | INTRAVENOUS | Status: DC
  Administered 2024-04-09: 5 ug/min via INTRAVENOUS
  Filled 2024-04-09: qty 250

## 2024-04-09 MED ORDER — ACETAMINOPHEN 160 MG/5ML PO SOLN
650.0000 mg | ORAL | Status: AC
  Administered 2024-04-09 – 2024-04-10 (×6): 650 mg
  Filled 2024-04-09 (×7): qty 20.3

## 2024-04-09 NOTE — Consult Note (Signed)
 Consultation Note Date: 04/09/2024   Patient Name: Jeffrey [name]  DOB: Jul 13, 1961  MRN: [id number]  Age / Sex: 63 y.o., male  PCP: Trellis Fries, MD Referring Physician: Lezlie Reddish, DO  Reason for Consultation: Establishing goals of care  HPI/Patient Profile: 63 y.o. male   admitted on 04/08/2024 who called EMS with respiratory distress, found to be in PEA arrest and ACLS was started with EMS.   According to EMR ACLS proceeded for 23 minutes until ROSC and unfortunately deteriorated again in PEA arrest for 10 minutes.  Currently in the ICU ;  intubated on pressors  Patient is known barriers to compliance with dialysis, has left AMA in the past.   According to family,  patient receives dialysis here at the hospital/ER  secondary to a confrontation at the dialysis center in which he is no longer able to receive treatment .     Today ex-wife tells me that she is in the process of moving Jeffrey [name] to the [facility] area to be closer to her and to secure a dialysis center in order to promote compliance.     Family face treatment option decisions, advanced directive decisions and anticipatory care needs.   Clinical Assessment and Goals of Care:  This NP Thena Fireman reviewed medical records, received report from team, assessed the patient and then meet at the patient's bedside along with his daughter/ Evonne Hoist and ex-wife Weyman Hammond current medical situation   Concept of Palliative Care was introduced as specialized medical care for people and their families living with serious illness.  If focuses on providing relief from the symptoms and stress of a serious illness.  The goal is to improve quality of life for both the patient and the family.    Daughter was updated by Dr. Charon Copper last night.  Education offered on the seriousness of the  patient's current medical situation; secondary to multiple  comorbidities and now  significant cardiac event, PEA arrest.  Attempted to elicit values and goals of care important to patient and family.  I inquired about any kind of advance care planning document.  Ex-wife did not want to talk about at this time.  Education offered today regarding  the importance of continued conversation within the family and with the  medical providers regarding overall plan of care and treatment options,  ensuring decisions are within the context of the patients values and GOCs.   Family is requesting to speak with CCM physician, I notified Dr. Marene Shape via secure chat, he will meet with the family in 30 minutes at bedside      Questions and concerns addressed.  Patient  encouraged to call with questions or concerns.     PMT will continue to support holistically.          No documented H POA or ACP documents noted in Vyn[facility].  Daughter at bedside and ex-wife tell me that they are the 2 main support people and act in unison to make decisions for Jeffrey [name]  SUMMARY OF RECOMMENDATIONS    Code Status/Advance Care Planning: Full code   Symptom Management:  Per attending   Additional Recommendations (Limitations, Scope, Preferences): Full Scope Treatment  Psycho-social/Spiritual:  Desire for further Chaplaincy support:no-declined Additional Recommendations: Grief/Bereavement Support  Prognosis:  Likely long-term poor prognosis  Discharge Planning: To Be Determined      Primary Diagnoses: Present on Admission:  Cardiac arrest [facility])   I have reviewed the medical record, interviewed the patient and family, and examined the patient. The following aspects are pertinent.  Past Medical History:  Diagnosis Date   Altered mental status 05/04/2021   Arthritis    Blood transfusion without reported diagnosis    CHF (congestive heart failure) (HCC)    COPD (chronic obstructive pulmonary disease) (HCC)    Depression    Diabetes mellitus without  complication (HCC)    type 2   Dyspnea    ESRD on hemodialysis (HCC)    Tues Thurs Sat   GERD (gastroesophageal reflux disease)    protonix    Heart murmur    as a child, no problems   Hypertension    Myocardial infarction (HCC)    PTSD (post-traumatic stress disorder)    Sepsis (HCC)    Sleep apnea    occasional uses CPAP   Uses powered wheelchair    Social History   Socioeconomic History   Marital status: Significant Other    Spouse name: Not on file   Number of children: Not on file   Years of education: Not on file   Highest education level: Not on file  Occupational History   Occupation: disabled  Tobacco Use   Smoking status: Former    Current packs/day: 0.00    Types: Cigarettes    Quit date: 2012    Years since quitting: 13.3   Smokeless tobacco: Never  Vaping Use   Vaping status: Never Used  Substance and Sexual Activity   Alcohol  use: Yes    Comment: occasionally   Drug use: Not Currently    Comment: Years ago   Sexual activity: Not on file  Other Topics Concern   Not on file  Social History Narrative   Not on file   Social Drivers of Health   Financial Resource Strain: Low Risk  (04/03/2024)   Received from [facility]   Overall Financial Resource Strain (CARDIA)    Difficulty of Paying Living Expenses: Not very hard  Food Insecurity: No Food Insecurity (04/03/2024)   Received from [facility]   Hunger Vital Sign    Worried About Running Out of Food in the Last Year: Never true    Ran Out of Food in the Last Year: Never true  Transportation Needs: Unmet Transportation Needs (04/03/2024)   Received from [facility] - Transportation    In the past 12 months, has lack of transportation kept you from medical appointments or from getting medications?: Yes    Lack of Transportation (Non-Medical): Yes  Physical Activity: Inactive (09/08/2021)   Received from [facility], [facility]   Exercise Vital Sign    Days of Exercise per Week: 0 days    Minutes of Exercise per Session: 0 min  Stress: Stress Concern Present (09/07/2022)   Received from [facility], [facility]   [employer] of Occupational Health - Occupational Stress Questionnaire    Feeling of Stress : Rather much  Social Connections: Moderately  Isolated (09/07/2022)   Received from [facility], [facility]   Social Connection and Isolation Panel [NHANES]    Frequency of Communication with Friends and Family: More than three times a week    Frequency of Social Gatherings with Friends and Family: Once a week    Attends Religious Services: 1 to 4 times per year    Active Member of [employer] or Organizations: No    Attends Banker Meetings: Never    Marital Status: Widowed   No family history on file. Scheduled Meds:  amLODipine   10 mg Per Tube Daily   carvedilol   25 mg Per Tube BID WC   Chlorhexidine  Gluconate Cloth  6 each Topical Daily   Chlorhexidine  Gluconate Cloth  6 each Topical Q0600   feeding supplement (PROSource TF20)  60 mL Per Tube Daily   feeding supplement (VITAL HIGH PROTEIN)  1,000 mL Per Tube Q24H   heparin  injection (subcutaneous)  5,000 Units Subcutaneous Q8H   ipratropium  0.5 mg Nebulization STAT   levETIRAcetam  500 mg Intravenous Q12H   mupirocin  ointment   Nasal BID   mouth rinse  15 mL Mouth Rinse Q2H   pantoprazole  (PROTONIX ) IV  40 mg Intravenous Q24H   vancomycin  variable dose per unstable renal function (pharmacist dosing)   Does not apply See admin instructions   Continuous Infusions:  ceFEPime  (MAXIPIME ) IV     insulin  14 Units/hr (04/09/24 1500)   nitroGLYCERIN  65 mcg/min (04/09/24 1500)   PRN Meds:.docusate, ipratropium-albuterol , mouth rinse, perflutren  lipid microspheres (DEFINITY ) IV suspension, polyethylene glycol Medications Prior to Admission:  Prior  to Admission medications   Medication Sig Start Date End Date Taking? Authorizing Provider  acetaminophen  (TYLENOL ) 325 MG tablet Take 2 tablets (650 mg total) by mouth every 6 (six) hours. 07/23/23  Yes Santa Cuba, MD  amLODipine  (NORVASC ) 10 MG tablet Take 10 mg by mouth every morning. 04/17/20  Yes [provider]  ASPIRIN  LOW DOSE 81 MG EC tablet Take 81 mg by mouth in the morning. 02/22/21  Yes [provider]  atorvastatin  (LIPITOR) 40 MG tablet Take 40 mg by mouth in the morning. 02/09/21  Yes [provider]  calcium  acetate (PHOSLO ) 667 MG capsule Take 2 capsules (1,334 mg total) by mouth with breakfast, with lunch, and with evening meal. 12/21/23  Yes Ejigiri, Juanda Noon, PA-C  carvedilol  (COREG ) 12.5 MG tablet Take 12.5 mg by mouth 2 (two) times daily. 04/08/24  Yes [provider]  clopidogrel  (PLAVIX ) 75 MG tablet Take 75 mg by mouth daily. 04/08/24  Yes [provider]  cyclobenzaprine  (FLEXERIL ) 10 MG tablet Take 10 mg by mouth 3 (three) times daily. 10/14/23  Yes [provider]  diazepam  (VALIUM ) 5 MG tablet Take 5 mg by mouth 2 (two) times daily as needed for muscle spasms. 12/09/21  Yes [provider]  HYDROcodone -acetaminophen  (NORCO/VICODIN) 5-325 MG tablet Take 1 tablet by mouth every 4 (four) hours as needed for moderate pain (pain score 4-6). 03/26/24  Yes Samtani, Jai-Gurmukh, MD  insulin  lispro (HUMALOG  KWIKPEN) 100 UNIT/ML KwikPen Inject 0-9 Units into the skin 3 (three) times daily with meals. CBG < 70: treat low blood sugar CBG 70 - 120: 0 units  CBG 121 - 150: 1 unit  CBG 151 - 200: 2 units  CBG 201 - 250: 3 units  CBG 251 - 300: 5 units  CBG 301 - 350: 7 units  CBG 351 - 400: 9 units  CBG > 400: call MD 09/27/23  Yes Etter Hermann., MD  insulin  regular human CONCENTRATED (HUMULIN  R U-500 KWIKPEN) 500 UNIT/ML KwikPen Inject 55 Units into the skin 2 (two) times daily with a meal. Follow up with your  PCP and [facility] for additional adjustments 09/27/23 04/09/24 Yes Etter Hermann., MD  iron  polysaccharides (NIFEREX) 150 MG capsule Take 1 capsule (150 mg total) by mouth daily. 09/26/23  Yes Ozell Blunt, MD  nortriptyline  (PAMELOR ) 10 MG capsule Take 20 mg by mouth at bedtime.   Yes [provider]  pantoprazole  (PROTONIX ) 40 MG tablet Take 40 mg by mouth daily before breakfast. 02/09/21  Yes [provider]  pregabalin  (LYRICA ) 100 MG capsule Take 100 mg by mouth. 03/20/24 04/19/24 Yes [provider]  sertraline  (ZOLOFT ) 100 MG tablet Take 100 mg by mouth in the morning. 02/22/21  Yes [provider]  tamsulosin  (FLOMAX ) 0.4 MG CAPS capsule Take 1 capsule (0.4 mg total) by mouth daily. 11/10/21  Yes Pahwani, Martha Slack, MD  torsemide  (DEMADEX ) 20 MG tablet Take 100 mg by mouth 2 (two) times daily. 04/08/24  Yes [provider]  traZODone  (DESYREL ) 150 MG tablet Take 150 mg by mouth at bedtime. 12/09/21  Yes [provider]  TRELEGY ELLIPTA 100-62.5-25 MCG/ACT AEPB Take 1 puff by mouth daily. 02/19/24  Yes [provider]  carvedilol  (COREG ) 25 MG tablet Take 1 tablet (25 mg total) by mouth 2 (two) times daily with a meal. Patient not taking: Reported on 04/09/2024 03/26/24   Samtani, Jai-Gurmukh, MD  [facility] VERIO test strip 1 each by Other route as needed. 03/04/24   [provider]  Vitamin D , Ergocalciferol , (DRISDOL ) 1.25 MG (50000 UNIT) CAPS capsule Take 1 capsule (50,000 Units total) by mouth every 7 (seven) days. Patient not taking: Reported on 03/21/2024 10/02/23   Ozell Blunt, MD   Allergies  Allergen Reactions   Actos [Pioglitazone] Anaphylaxis and Rash   Dexmedetomidine  Nausea And Vomiting and Other (See Comments)    (Precedex ) Dose-limiting bradycardia     Ibuprofen Shortness Of Breath, Swelling and Other (See Comments)    Pt tolerates aspirin     Tomato Anaphylaxis and Other (See Comments)    Only  allergic to RAW tomatoes   Wellbutrin [Bupropion] Swelling   Review of Systems  Unable to perform ROS: Intubated    Physical Exam  Vital Signs: BP (!) 209/77   Pulse 100   Temp (!) 100.9 F (38.3 C)   Resp (!) 26   Ht 5\' 5"  (1.651 m)   Wt (!) 139.8 kg   SpO2 96%   BMI 51.29 kg/m  Pain Scale: CPOT       SpO2: SpO2: 96 % O2 Device:SpO2: 96 % O2 Flow Rate: .   IO: Intake/output summary:  Intake/Output Summary (Last 24 hours) at 04/09/2024 1603 Last data filed at 04/09/2024 1500 Gross per 24 hour  Intake 2444.21 ml  Output 3975 ml  Net -1530.79 ml    LBM: Last BM Date :  (PTA) Baseline Weight: Weight: (!) 142.7 kg (Bedside) Most recent weight: Weight: (!) 139.8 kg     Palliative Assessment/Data:     Time:   75 minutes  Discussed with CCM via secure chat and bedside RN   Signed by: Thena Fireman, NP   Please contact Palliative Medicine Team phone at [phone] for questions and concerns.  For individual provider: See Tilford Foley

## 2024-04-09 NOTE — Procedures (Signed)
 I was present at the procedure, reviewed the HD regimen and made appropriate changes.   Larry Poag MD  [facility] 04/09/2024, 09:34 AM

## 2024-04-09 NOTE — Progress Notes (Signed)
 Pharmacy Antibiotic Note Jeffrey [name] is a 63 y.o. male admitted on 04/08/2024 with concern for sepsis s/p cardiac arrest. PMH significant for ESRD on HD, missed  Pharmacy has been consulted for vancomycin  dosing.  Plan: Vancomycin  1g x 1 after dialysis today  Cefepime  changed to 1 g q24h per protocol  F/u dialysis schedule, infectious work up, and length of therapy   Height: 5\' 5"  (165.1 cm) Weight: (!) 142.7 kg (314 lb 9.5 oz) (Bedside) IBW/kg (Calculated) : 61.5  Temp (24hrs), Avg:98.2 F (36.8 C), Min:97.9 F (36.6 C), Max:98.7 F (37.1 C)  Recent Labs  Lab 04/02/24 1215 04/04/24 0756 04/08/24 2140 04/08/24 2150 04/08/24 2218 04/09/24 0030 04/09/24 0037  WBC 4.9 4.3 14.0*  --   --  9.4  --   CREATININE  --  5.68* 6.36* 5.90*  --  6.56* 6.47*  LATICACIDVEN  --   --   --   --  3.4* 1.1  --     Estimated Creatinine Clearance: 15.5 mL/min (A) (by C-G formula based on SCr of 6.47 mg/dL (H)).    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  Patience Bonito 04/09/2024 8:22 AM

## 2024-04-09 NOTE — Inpatient Diabetes Management (Signed)
 Inpatient Diabetes Program Recommendations  AACE/ADA: New Consensus Statement on Inpatient Glycemic Control (2015)  Target Ranges:  Prepandial:   less than 140 mg/dL      Peak postprandial:   less than 180 mg/dL (1-2 hours)      Critically ill patients:  140 - 180 mg/dL   Lab Results  Component Value Date   GLUCAP 302 (H) 04/09/2024   HGBA1C 8.5 (H) 03/22/2024    Review of Glycemic Control  Latest Reference Range & Units 04/08/24 23:52 04/09/24 03:50 04/09/24 09:43  Glucose-Capillary 70 - 99 mg/dL 244 (H) 010 (H) 272 (H)   Diabetes history: DM 2 Outpatient Diabetes medications:  U500 55 units bid, Humalog  0-9 units tid with meals Current orders for Inpatient glycemic control:  IV insulin -  Inpatient Diabetes Program Recommendations:    Agree with IV insulin  for patient.  He was on concentrated U500 prior to admit.  Will follow.   Thanks,  Josefa Ni, RN, BC-ADM Inpatient Diabetes Coordinator Pager [phone]  (8a-5p)

## 2024-04-09 NOTE — Progress Notes (Signed)
 Brief Nutrition Note  Consult received for enteral/tube feeding initiation and management.  Adult Enteral Nutrition Protocol initiated. Full assessment to follow.  OGT tube in place with tip located in stomach per xray imaging.   Admitting Dx: Cardiac arrest (HCC) [I46.9]  Body mass index is 51.29 kg/m. Pt meets criteria for morbid obesity based on current BMI.  Labs:  Recent Labs  Lab 04/04/24 0756 04/08/24 2140 04/08/24 2148 04/08/24 2150 04/08/24 2227 04/09/24 0030 04/09/24 0037 04/09/24 0326  NA 137 136   < > 136 136  --  134* 134*  K 4.9 5.2*   < > 5.3* 5.4*  --  7.1* 6.3*  CL 98 100  --  102  --   --  98  --   CO2 26 24  --   --   --   --  25  --   BUN 72* 69*  --  76*  --   --  75*  --   CREATININE 5.68* 6.36*  --  5.90*  --  6.56* 6.47*  --   CALCIUM  8.1* 8.9  --   --   --   --  8.4*  --   MG 1.9  --   --   --   --   --  2.1  --   PHOS >30.0*  --   --   --   --   --   --   --   GLUCOSE 195* 394*  --  400*  --   --  426*  --    < > = values in this interval not displayed.    Allie Jaison Petraglia, RDN, LDN Clinical Nutrition See AMiON for contact information.

## 2024-04-09 NOTE — TOC CM/SW Note (Signed)
 Transition of Care [facility]) - Inpatient Brief Assessment   Patient Details  Name: Jeffrey [name] MRN: [id number] Date of Birth: 1960/12/05  Transition of [facility]) CM/SW Contact:    Tom-Johnson, Angelique Ken, RN Phone Number: 04/09/2024, 2:14 PM   Clinical Narrative:  Patient presented to the ED with worsening Respiratory Distress and found to be in PEA, EMS started ACLS. Patient was intubated and currently intubated. CXR showed Pulmonary Edema.  Patient has hx of ESRD, missed outpatient HD last week. Patient does not have outpatient Clinic, comes to the ED on TTS schedule for HD sessions. Nephrology following for inpatient HD.   Patient not Medically ready for discharge.  CM will continue to follow as patient progresses with care towards discharge.              Transition of Care Asessment: Insurance and Status: Insurance coverage has been reviewed Patient has primary care physician: Yes Home environment has been reviewed: Yes Prior level of function:: Modified Independent   Social Drivers of Health Review: SDOH reviewed no interventions necessary Readmission risk has been reviewed: Yes Transition of care needs: transition of care needs identified, TOC will continue to follow

## 2024-04-09 NOTE — Progress Notes (Signed)
 EEG complete - results pending

## 2024-04-09 NOTE — Procedures (Signed)
 Patient Name: Jeffrey [name]  MRN: [id number]  Epilepsy Attending: Arleene Lack  Referring Physician/Provider: Lanell Pinta, MD  Date: 04/09/2024 Duration: 22.39 mins  Patient history: 63yo M s/p cardiac arrest. EEG to evaluate for seizure  Level of alertness:  comatose  AEDs during EEG study: Propofol , LEV  Technical aspects: This EEG study was done with scalp electrodes positioned according to the 10-20 International system of electrode placement. Electrical activity was reviewed with band pass filter of 1-70Hz , sensitivity of 7 uV/mm, display speed of 45mm/sec with a 60Hz  notched filter applied as appropriate. EEG data were recorded continuously and digitally stored.  Video monitoring was available and reviewed as appropriate.  Description: EEG showed burst suppression pattern with burst of 3-5hz  theta-delta slowing lasting 2-4 seconds alternating with 30 seconds of generalized suppression. Hyperventilation and photic stimulation were not performed.    EKG artifact was seen during the study.   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 No seizures or epileptiform discharges were seen throughout the recording.  [name]

## 2024-04-09 NOTE — Progress Notes (Signed)
  Echocardiogram 2D Echocardiogram has been attempted. Pt getting a few procedures at once in room. Staff agreed I should come back in a bit  Fain Home RDCS 04/09/2024, 8:07 AM

## 2024-04-09 NOTE — Progress Notes (Signed)
 Pt ESRD with no outpt dialysis center Well-known to our service Post- arrest, 23 min down time K 7.1 On vasopressin  per Elink HD orders placed, RN aware- priority ASAP Full c/s to follow.    Jeffrey Pro MD [employer] Pgr [phone]

## 2024-04-09 NOTE — Progress Notes (Signed)
 Updated family at the bedside about his Echo, EEG, and other all clinical condition with possible anoxic brain injury, PNA, fever, HD, and Rx provided so far. RN was present as well.

## 2024-04-09 NOTE — Progress Notes (Addendum)
 eLink Physician-Brief Progress Note Patient Name: RAMEZ MARBERRY DOB: 07-28-1961 MRN: [id number]   Date of Service  04/09/2024  HPI/Events of Note  Notified of critical labs - K 7.1, crea 6.47, glucose 426.   Pt is ESRD on HD, post PEA arrest, on vasopressin .  eICU Interventions  Give calcium  gluconate, insulin , lokelma, sodium bicarb. Goal of normothermia. Awaiting callback from Nephrology regarding CRRT.     Intervention Category Intermediate Interventions: Electrolyte abnormality - evaluation and management  Lanell Pinta 04/09/2024, 1:37 AM  5:36 AM Notified of troponin at 129.  Plan> Follow up echo.  Continue to trend troponin.  Renal has placed orders for dialysis.

## 2024-04-09 NOTE — Consult Note (Signed)
 Renal Service Consult Note [facility]  [place] RINEHIMER 04/09/2024 Lynae Sandifer, MD Requesting Physician: Dr. Charon Copper   Reason for Consult: ESRD pt w/ cardiac arrest HPI: The patient is a 63 y.o. year-old w/ PMH as below who presented to ED yesterday evening w/ CPR in progress.  The patient had reported shortness of breath and had missed a dialysis treatments last week.  Patient had ROSC after about 23 minutes of CPR/ACLS. Chest x-ray showed pulmonary edema.  CCM admitted the patient.  He was on drips w/ 20 of epi and 0.03 of vasopressin .  Patient did receive TNK during the code.  Postcode blood pressure was 130/50 per A-line, heart rate 80, RR 10 and temp 98.1.  Potassium was 5.3 originally then rose to 7.1 after midnight and 6.3 this morning.  Was admitted to ICU and we are asked to see for dialysis.  Pt seen in ICU.  Patient is on the ventilator nonresponsive, sedated.   ROS -N/A  PMH: Arthritis CHF COPD Depression Diabetes type 2 ESRD on HD GERD HTN MI PTSD Sleep apnea Uses powered wheelchair  Past Surgical History  Past Surgical History:  Procedure Laterality Date   A/V FISTULAGRAM N/A 08/30/2021   Procedure: A/V FISTULAGRAM;  Surgeon: Adine Hoof, MD;  Location: [facility] INVASIVE CV LAB;  Service: Cardiovascular;  Laterality: N/A;   A/V FISTULAGRAM Right 07/28/2022   Procedure: A/V Fistulagram;  Surgeon: Adine Hoof, MD;  Location: [facility] INVASIVE CV LAB;  Service: Cardiovascular;  Laterality: Right;   A/V FISTULAGRAM Right 09/05/2022   Procedure: A/V Fistulagram;  Surgeon: Adine Hoof, MD;  Location: [facility] INVASIVE CV LAB;  Service: Cardiovascular;  Laterality: Right;   A/V FISTULAGRAM Right 10/31/2022   Procedure: A/V Fistulagram;  Surgeon: Adine Hoof, MD;  Location: [facility] INVASIVE CV LAB;  Service: Cardiovascular;  Laterality: Right;   A/V SHUNT INTERVENTION Right 10/31/2022   Procedure: A/V SHUNT INTERVENTION;   Surgeon: Adine Hoof, MD;  Location: [facility] INVASIVE CV LAB;  Service: Cardiovascular;  Laterality: Right;   A/V SHUNTOGRAM N/A 11/08/2021   Procedure: A/V SHUNTOGRAM;  Surgeon: Adine Hoof, MD;  Location: [facility] INVASIVE CV LAB;  Service: Cardiovascular;  Laterality: N/A;   AMPUTATION Left 04/03/2021   Procedure: LEFT ABOVE-THE-KNEE AMPUTATION;  Surgeon: Donnamarie Gables, MD;  Location: [facility] OR;  Service: Orthopedics;  Laterality: Left;   AV FISTULA PLACEMENT Right 08/12/2021   Procedure: INSERTION OF ARTERIOVENOUS (AV) GORE-TEX GRAFT RIGHT ARM;  Surgeon: Margherita Shell, MD;  Location: [facility] OR;  Service: Vascular;  Laterality: Right;   AV FISTULA PLACEMENT Left 04/07/2023   Procedure: ARTERIOVENOUS (AV) FISTULA GRAFT USING GORETEX STRETCH (4-7MM);  Surgeon: Dannis Dy, MD;  Location: [facility] OR;  Service: Vascular;  Laterality: Left;   INSERTION OF DIALYSIS CATHETER N/A 11/25/2022   Procedure: INSERTION OF DIALYSIS CATHETER;  Surgeon: Josiah Nigh, MD;  Location: [facility] ENDOSCOPY;  Service: Pulmonary;  Laterality: N/A;   INSERTION OF DIALYSIS CATHETER N/A 03/03/2023   Procedure: INSERTION OF TUNNELED DIALYSIS CATHETER;  Surgeon: Adine Hoof, MD;  Location: [facility] OR;  Service: Vascular;  Laterality: N/A;   INSERTION OF DIALYSIS CATHETER Left 04/07/2023   Procedure: INSERTION OF DIALYSIS CATHETER USING PALINDROME  28CM PRECISION CHRONIC CATHETER KIT;  Surgeon: Dannis Dy, MD;  Location: [facility] OR;  Service: Vascular;  Laterality: Left;   INSERTION OF DIALYSIS CATHETER Left 07/18/2023   Procedure: INSERTION OF TUNNELED DIALYSIS CATHETER USING 23cm PALINDROME CHRONIC CATHETER;  Surgeon:  Dannis Dy, MD;  Location: [facility] OR;  Service: Vascular;  Laterality: Left;   IR REMOVAL TUN CV CATH W/O FL  03/30/2021   JOINT REPLACEMENT     Bilateral knees   LIGATION ARTERIOVENOUS GORTEX GRAFT Right 03/03/2023   Procedure: LIGATION RIGHT ARM ARTERIOVENOUS GORTEX  GRAFT;  Surgeon: Adine Hoof, MD;  Location: [facility] OR;  Service: Vascular;  Laterality: Right;   PERIPHERAL VASCULAR BALLOON ANGIOPLASTY Right 08/30/2021   Procedure: PERIPHERAL VASCULAR BALLOON ANGIOPLASTY;  Surgeon: Adine Hoof, MD;  Location: [facility] INVASIVE CV LAB;  Service: Cardiovascular;  Laterality: Right;   PERIPHERAL VASCULAR BALLOON ANGIOPLASTY Right 07/28/2022   Procedure: PERIPHERAL VASCULAR BALLOON ANGIOPLASTY;  Surgeon: Adine Hoof, MD;  Location: [facility] INVASIVE CV LAB;  Service: Cardiovascular;  Laterality: Right;  AVF   PERIPHERAL VASCULAR INTERVENTION  11/08/2021   Procedure: PERIPHERAL VASCULAR INTERVENTION;  Surgeon: Adine Hoof, MD;  Location: [facility] INVASIVE CV LAB;  Service: Cardiovascular;;  Central rt arm fistula   UPPER EXTREMITY VENOGRAPHY Right 08/10/2021   Procedure: UPPER EXTREMITY VENOGRAPHY;  Surgeon: Margherita Shell, MD;  Location: [facility] INVASIVE CV LAB;  Service: Cardiovascular;  Laterality: Right;   Family History No family history on file. Social History  reports that he quit smoking about 13 years ago. His smoking use included cigarettes. He has never used smokeless tobacco. He reports current alcohol  use. He reports that he does not currently use drugs. Allergies  Allergies  Allergen Reactions   Actos [Pioglitazone] Anaphylaxis and Rash   Dexmedetomidine  Nausea And Vomiting and Other (See Comments)    (Precedex ) Dose-limiting bradycardia     Ibuprofen Shortness Of Breath, Swelling and Other (See Comments)    Pt tolerates aspirin     Tomato Anaphylaxis and Other (See Comments)    Only allergic to RAW tomatoes   Wellbutrin [Bupropion] Swelling   Home medications Prior to Admission medications   Medication Sig Start Date End Date Taking? Authorizing Provider  acetaminophen  (TYLENOL ) 325 MG tablet Take 2 tablets (650 mg total) by mouth every 6 (six) hours. 07/23/23   Santa Cuba, MD  amLODipine  (NORVASC ) 10 MG  tablet Take 10 mg by mouth every morning. 04/17/20   [provider]  ASPIRIN  LOW DOSE 81 MG EC tablet Take 81 mg by mouth in the morning. 02/22/21   [provider]  atorvastatin  (LIPITOR) 40 MG tablet Take 40 mg by mouth in the morning. 02/09/21   [provider]  calcium  acetate (PHOSLO ) 667 MG capsule Take 2 capsules (1,334 mg total) by mouth with breakfast, with lunch, and with evening meal. 12/21/23   Ejigiri, Juanda Noon, PA-C  carvedilol  (COREG ) 25 MG tablet Take 1 tablet (25 mg total) by mouth 2 (two) times daily with a meal. 03/26/24   Samtani, Jai-Gurmukh, MD  cyclobenzaprine  (FLEXERIL ) 10 MG tablet Take 10 mg by mouth 3 (three) times daily. 10/14/23   [provider]  diazepam  (VALIUM ) 5 MG tablet Take 5 mg by mouth 2 (two) times daily as needed for muscle spasms. 12/09/21   [provider]  HYDROcodone -acetaminophen  (NORCO/VICODIN) 5-325 MG tablet Take 1 tablet by mouth every 4 (four) hours as needed for moderate pain (pain score 4-6). 03/26/24   Samtani, Jai-Gurmukh, MD  insulin  lispro (HUMALOG  KWIKPEN) 100 UNIT/ML KwikPen Inject 0-9 Units into the skin 3 (three) times daily with meals. CBG < 70: treat low blood sugar CBG 70 - 120: 0 units  CBG 121 - 150: 1 unit  CBG 151 - 200:  2 units  CBG 201 - 250: 3 units  CBG 251 - 300: 5 units  CBG 301 - 350: 7 units  CBG 351 - 400: 9 units  CBG > 400: call MD 09/27/23   Etter Hermann., MD  insulin  regular human CONCENTRATED (HUMULIN  R U-500 KWIKPEN) 500 UNIT/ML KwikPen Inject 55 Units into the skin 2 (two) times daily with a meal. Follow up with your PCP and [facility] for additional adjustments 09/27/23 03/21/24  Etter Hermann., MD  iron  polysaccharides (NIFEREX) 150 MG capsule Take 1 capsule (150 mg total) by mouth daily. 09/26/23   Ozell Blunt, MD  nortriptyline  (PAMELOR ) 10 MG capsule Take 20 mg by mouth at bedtime.    [provider]  [facility] VERIO test strip 1 each by  Other route as needed. 03/04/24   [provider]  pantoprazole  (PROTONIX ) 40 MG tablet Take 40 mg by mouth daily before breakfast. 02/09/21   [provider]  pregabalin  (LYRICA ) 100 MG capsule Take 100 mg by mouth. 03/20/24 04/19/24  [provider]  sertraline  (ZOLOFT ) 100 MG tablet Take 100 mg by mouth in the morning. 02/22/21   [provider]  tamsulosin  (FLOMAX ) 0.4 MG CAPS capsule Take 1 capsule (0.4 mg total) by mouth daily. 11/10/21   Modena Andes, MD  traZODone  (DESYREL ) 150 MG tablet Take 150 mg by mouth at bedtime. 12/09/21   [provider]  TRELEGY ELLIPTA 100-62.5-25 MCG/ACT AEPB Take 1 puff by mouth daily. 02/19/24   [provider]  Vitamin D , Ergocalciferol , (DRISDOL ) 1.25 MG (50000 UNIT) CAPS capsule Take 1 capsule (50,000 Units total) by mouth every 7 (seven) days. Patient not taking: Reported on 03/21/2024 10/02/23   Ozell Blunt, MD     Vitals:   04/09/24 1015 04/09/24 1030 04/09/24 1045 04/09/24 1100  BP: (!) 216/79 (!) 212/77 (!) 211/78 (!) 210/78  Pulse: 92 93 95 95  Resp: (!) 23 (!) 26 (!) 26 (!) 26  Temp:      TempSrc:      SpO2: 98% 98% 98% 98%  Weight:      Height:       Exam Gen on vent, sedated No rash, cyanosis or gangrene Sclera anicteric, throat w/ ETT No jvd or bruits Chest clear anterior/ lateral RRR no MRG Abd soft ntnd no mass or ascites +bs GU normal MS no joint effusions or deformity Ext diffuse bilat 2+ LE edema Neuro is on vent, sedated   AVG+bruit     Renal-related home meds: Norvasc  10 daily PhosLo  2 AC 3 times daily Coreg  25 twice daily Others: Trelegy Ellipta, trazodone , Flomax , Zoloft , Lyrica , PPI, nortriptyline , insulin , Norco, Valium , Lipitor, aspirin     OP HD: Patient does not have an outpatient dialysis unit, patient was discharged from [facility] due to behavior issues about 16 months ago    Assessment/ Plan: Cardiac arrest: 23 minutes downtime, per CCM Hyperkalemia: Not  severe initially then bumped up to 7.1.  Will repeat after dialysis this a.m. ESRD: Patient does not have an outpatient dialysis unit, he was discharged from [facility] 16 months ago due to behavior issues.  He has been dialyzing in the hospital on an irregular basis. HD in progress this morning.  BP: takes Norvasc  and Coreg  at home, holding for now Volume: Chronic volume overload in the legs with leg swelling, chest x-ray x 1 here showed significant perihilar changes probably vascular congestion. Anemia of esrd: Hgb 7-9 here, follow Secondary hyperparathyroidism: Corrected  calcium  in range, Phos pending.  Hold binders until eating.  Larry Poag  MD [facility] 04/09/2024, 11:18 AM  Recent Labs  Lab 04/04/24 0756 04/08/24 2140 04/08/24 2148 04/09/24 0030 04/09/24 0037 04/09/24 0326  HGB 7.8* 8.5*   < > 8.4*  --  8.2*  ALBUMIN  3.3* 2.8*  --   --   --   --   CALCIUM  8.1* 8.9  --   --  8.4*  --   PHOS >30.0*  --   --   --   --   --   CREATININE 5.68* 6.36*   < > 6.56* 6.47*  --   K 4.9 5.2*   < >  --  7.1* 6.3*   < > = values in this interval not displayed.   Inpatient medications:  albuterol   10 mg/hr Nebulization Once   amLODipine   10 mg Per Tube Daily   carvedilol   25 mg Per Tube BID WC   Chlorhexidine  Gluconate Cloth  6 each Topical Daily   Chlorhexidine  Gluconate Cloth  6 each Topical Q0600   heparin  injection (subcutaneous)  5,000 Units Subcutaneous Q8H   ipratropium  0.5 mg Nebulization STAT   levETIRAcetam  500 mg Intravenous Q12H   mupirocin  ointment   Nasal BID   mouth rinse  15 mL Mouth Rinse Q2H   pantoprazole  (PROTONIX ) IV  40 mg Intravenous Q24H   vancomycin  variable dose per unstable renal function (pharmacist dosing)   Does not apply See admin instructions    ceFEPime  (MAXIPIME ) IV     insulin  11.5 Units/hr (04/09/24 1016)   vancomycin      docusate, mouth rinse, polyethylene glycol

## 2024-04-09 NOTE — Progress Notes (Addendum)
 NAME:  Jeffrey [name], MRN:  [id number], DOB:  11-10-1961, LOS: 1 ADMISSION DATE:  04/08/2024, CONSULTATION DATE: 04/09/2024 REFERRING MD: EDP, CHIEF COMPLAINT: cardiac arrest    History of Present Illness:  A 63 yo male poorly compliant with dialysis (leaves ama as well), presented to hospital for sessions of late; recent hospitalization late April for undetermined arrhythmia who called ems with resp distress found to be in pea and acls started with EMS services. Reportedly missed multiple sessions of dialysis last week. Per chart review pt was in PEA, undergoing ACLS for ~23 mins until ROSC and unfortunately deteriorated shortly after again in PEA ~10 mins with ROSC now on escalated pressors and inotropic support.    CXR revealed pulmonary edema. EDP was gracious to place CVC and arterial line for pt. Unable to reach family at this time. CCM was asked to admit pt. All history is obtained from chart review as pt is intubated and unresponsive.    Cth pending   Pt seen with R facial twitching, unresponsive on low dose dose propofol , no purposeful movement. On 20epi and 0.03 vasopressin . NE on hold. Pt was given TNK during code and has epistaxis and bleeding from line sites. Labs being sent now.   Pertinent  Medical History  ESRD on HD, poorly adherent OSA, intermittent compliance with cpap T2DM with hyperglycemia HTN Hyperlipidemia Chronic anemia 2/2 ESRD COPD Chronic hypoxic resp failure on 3L baseline  Significant Hospital Events: Including procedures, antibiotic start and stop dates in addition to other pertinent events   5/12: admitted to ICU post arrest  5/13: on HD, Propofol  for sedation, PRVC 26/490/+8/70%, ABG 7.29/56/75/93%. Positive fluid balance 1.3 since admission.   Interim History / Subjective:    Objective    Blood pressure (!) 208/80, pulse 96, temperature 99.3 F (37.4 C), resp. rate (!) 26, height 5\' 5"  (1.651 m), weight (!) 142.7 kg, SpO2 98%.    Vent Mode:  PRVC FiO2 (%):  [40 %-100 %] 70 % Set Rate:  [18 bmp-26 bmp] 26 bmp Vt Set:  [490 mL] 490 mL PEEP:  [8 cmH20] 8 cmH20 Plateau Pressure:  [29 cmH20-31 cmH20] 30 cmH20   Intake/Output Summary (Last 24 hours) at 04/09/2024 1139 Last data filed at 04/09/2024 1129 Gross per 24 hour  Intake 2132.58 ml  Output 3700 ml  Net -1567.42 ml   Filed Weights   04/09/24 0728  Weight: (!) 142.7 kg    Examination: General: sedated, and comfortable. SpO2 99%  HENT: pupils are equal but not reactive. No LNE or thyromegaly. ?JVD Lungs: symmetrical air entry bilaterally. No crackles or wheezing Cardiovascular: NL S1/S2. No m/g/r Abdomen: no distension or tenderness Extremities: left AKA. Right femoral CVC and a-line.   Neuro: no gag or corneal reflexes      Resolved problem list   Assessment and Plan  Acute OOH cardiac arrest PEA twice (23 min and 10 min), s/p ROSC -Maintain Temp <37.5 C -PRN vasopressors , MAP >65 mmHg -Serial LA: normalized  -Dietitian for TF -Echo -Palliative care consult -d/c sedation  Acute on chronic hypoxic, and acute hypercarbic resp failure Possible aspiration PNA. MRSA screening is positive  COPD, HOT 3L, not in acute exacerbation -VAP and PAD protocols -ABG and PCXR PRN -Vanc and Cefepime  -f/u Cx -Duoneb PRN  Shock, cardiogenic Lactic acidosis: resolved  -Off Levophed -Echo  Shock liver -Monitor LFT  Seizure like activity, ? Myoclonus from hypoxic injury -Continue Keppra for now  -f/u EEG reading: pending  ESRD on  HD Hyperkalemia -On HD -appreciate nephrology input -Serial labs  Acute pulmonary edema, HTN, D-CHF (03/22/2024: Echo EF 60%, G2DD) -BP control -HD -Resume home meds  Anemia of chronic disease -Monitor CBC  DM-2 with hyperglycemia -Glycemic control 140-180 mg/dl - Insulin  drip   OSA with limited compliance with CPAP Morbid obesity   GERD -PPI  Best Practice (right click and "Reselect all SmartList Selections" daily)    Diet/type: NPO w/ oral meds DVT prophylaxis prophylactic heparin   Pressure ulcer(s): N/A GI prophylaxis: PPI Lines: Central line and Arterial Line Foley:  N/A Code Status:  full code Last date of multidisciplinary goals of care discussion []   Labs   CBC: Recent Labs  Lab 04/02/24 1215 04/04/24 0756 04/08/24 2140 04/08/24 2148 04/08/24 2150 04/08/24 2227 04/09/24 0030 04/09/24 0326  WBC 4.9 4.3 14.0*  --   --   --  9.4  --   NEUTROABS 3.6 3.2 11.0*  --   --   --   --   --   HGB 7.9* 7.8* 8.5* 8.5* 8.8* 8.5* 8.4* 8.2*  HCT 25.9* 25.2* 29.2* 25.0* 26.0* 25.0* 27.9* 24.0*  MCV 93.8 93.7 99.0  --   --   --  94.9  --   PLT 80* 108* 127*  --   --   --  121*  124*  --     Basic Metabolic Panel: Recent Labs  Lab 04/02/24 1215 04/04/24 0756 04/04/24 0756 04/08/24 2140 04/08/24 2148 04/08/24 2150 04/08/24 2227 04/09/24 0030 04/09/24 0037 04/09/24 0326  NA  --  137   < > 136 136 136 136  --  134* 134*  K  --  4.9   < > 5.2* 5.2* 5.3* 5.4*  --  7.1* 6.3*  CL  --  98  --  100  --  102  --   --  98  --   CO2  --  26  --  24  --   --   --   --  25  --   GLUCOSE  --  195*  --  394*  --  400*  --   --  426*  --   BUN  --  72*  --  69*  --  76*  --   --  75*  --   CREATININE  --  5.68*  --  6.36*  --  5.90*  --  6.56* 6.47*  --   CALCIUM   --  8.1*  --  8.9  --   --   --   --  8.4*  --   MG 2.0 1.9  --   --   --   --   --   --  2.1  --   PHOS  --  >30.0*  --   --   --   --   --   --   --   --    < > = values in this interval not displayed.   GFR: Estimated Creatinine Clearance: 15.5 mL/min (A) (by C-G formula based on SCr of 6.47 mg/dL (H)). Recent Labs  Lab 04/02/24 1215 04/04/24 0756 04/08/24 2140 04/08/24 2218 04/09/24 0030  WBC 4.9 4.3 14.0*  --  9.4  LATICACIDVEN  --   --   --  3.4* 1.1    Liver Function Tests: Recent Labs  Lab 04/04/24 0756 04/08/24 2140  AST  --  529*  ALT  --  637*  ALKPHOS  --  92  BILITOT  --  0.5  PROT  --  5.8*  ALBUMIN  3.3*  2.8*   No results for input(s): "LIPASE", "AMYLASE" in the last 168 hours. No results for input(s): "AMMONIA" in the last 168 hours.  ABG    Component Value Date/Time   PHART 7.292 (L) 04/09/2024 0326   PCO2ART 55.8 (H) 04/09/2024 0326   PO2ART 75 (L) 04/09/2024 0326   HCO3 27.1 04/09/2024 0326   TCO2 29 04/09/2024 0326   ACIDBASEDEF 1.4 04/08/2024 2227   ACIDBASEDEF 1.0 04/08/2024 2227   O2SAT 93 04/09/2024 0326     Coagulation Profile: Recent Labs  Lab 04/09/24 0030  INR 1.5*    Cardiac Enzymes: No results for input(s): "CKTOTAL", "CKMB", "CKMBINDEX", "TROPONINI" in the last 168 hours.  HbA1C: Hgb A1c MFr Bld  Date/Time Value Ref Range Status  03/22/2024 04:38 AM 8.5 (H) 4.8 - 5.6 % Final    Comment:    (NOTE) Pre diabetes:          5.7%-6.4%  Diabetes:              >6.4%  Glycemic control for   <7.0% adults with diabetes   09/17/2023 02:27 AM 11.1 (H) 4.8 - 5.6 % Final    Comment:    (NOTE) Pre diabetes:          5.7%-6.4%  Diabetes:              >6.4%  Glycemic control for   <7.0% adults with diabetes     CBG: Recent Labs  Lab 04/08/24 2352 04/09/24 0350 04/09/24 0943 04/09/24 1011 04/09/24 1119  GLUCAP 360* 361* 302* 330* 266*    Review of Systems:   Intubated   Past Medical History:  He,  has a past medical history of Altered mental status (05/04/2021), Arthritis, Blood transfusion without reported diagnosis, CHF (congestive heart failure) (HCC), COPD (chronic obstructive pulmonary disease) (HCC), Depression, Diabetes mellitus without complication (HCC), Dyspnea, ESRD on hemodialysis (HCC), GERD (gastroesophageal reflux disease), Heart murmur, Hypertension, Myocardial infarction (HCC), PTSD (post-traumatic stress disorder), Sepsis (HCC), Sleep apnea, and Uses powered wheelchair.   Surgical History:   Past Surgical History:  Procedure Laterality Date   A/V FISTULAGRAM N/A 08/30/2021   Procedure: A/V FISTULAGRAM;  Surgeon: Adine Hoof, MD;  Location: [facility] INVASIVE CV LAB;  Service: Cardiovascular;  Laterality: N/A;   A/V FISTULAGRAM Right 07/28/2022   Procedure: A/V Fistulagram;  Surgeon: Adine Hoof, MD;  Location: [facility] INVASIVE CV LAB;  Service: Cardiovascular;  Laterality: Right;   A/V FISTULAGRAM Right 09/05/2022   Procedure: A/V Fistulagram;  Surgeon: Adine Hoof, MD;  Location: [facility] INVASIVE CV LAB;  Service: Cardiovascular;  Laterality: Right;   A/V FISTULAGRAM Right 10/31/2022   Procedure: A/V Fistulagram;  Surgeon: Adine Hoof, MD;  Location: [facility] INVASIVE CV LAB;  Service: Cardiovascular;  Laterality: Right;   A/V SHUNT INTERVENTION Right 10/31/2022   Procedure: A/V SHUNT INTERVENTION;  Surgeon: Adine Hoof, MD;  Location: [facility] INVASIVE CV LAB;  Service: Cardiovascular;  Laterality: Right;   A/V SHUNTOGRAM N/A 11/08/2021   Procedure: A/V SHUNTOGRAM;  Surgeon: Adine Hoof, MD;  Location: [facility] INVASIVE CV LAB;  Service: Cardiovascular;  Laterality: N/A;   AMPUTATION Left 04/03/2021   Procedure: LEFT ABOVE-THE-KNEE AMPUTATION;  Surgeon: Donnamarie Gables, MD;  Location: [facility] OR;  Service: Orthopedics;  Laterality: Left;   AV FISTULA PLACEMENT Right 08/12/2021   Procedure: INSERTION OF ARTERIOVENOUS (AV) GORE-TEX GRAFT RIGHT ARM;  Surgeon: Genny Kid  W, MD;  Location: [facility] OR;  Service: Vascular;  Laterality: Right;   AV FISTULA PLACEMENT Left 04/07/2023   Procedure: ARTERIOVENOUS (AV) FISTULA GRAFT USING GORETEX STRETCH (4-7MM);  Surgeon: Dannis Dy, MD;  Location: [facility] OR;  Service: Vascular;  Laterality: Left;   INSERTION OF DIALYSIS CATHETER N/A 11/25/2022   Procedure: INSERTION OF DIALYSIS CATHETER;  Surgeon: Josiah Nigh, MD;  Location: [facility] ENDOSCOPY;  Service: Pulmonary;  Laterality: N/A;   INSERTION OF DIALYSIS CATHETER N/A 03/03/2023   Procedure: INSERTION OF TUNNELED DIALYSIS CATHETER;  Surgeon: Adine Hoof, MD;  Location:  [facility] OR;  Service: Vascular;  Laterality: N/A;   INSERTION OF DIALYSIS CATHETER Left 04/07/2023   Procedure: INSERTION OF DIALYSIS CATHETER USING PALINDROME  28CM PRECISION CHRONIC CATHETER KIT;  Surgeon: Dannis Dy, MD;  Location: [facility] OR;  Service: Vascular;  Laterality: Left;   INSERTION OF DIALYSIS CATHETER Left 07/18/2023   Procedure: INSERTION OF TUNNELED DIALYSIS CATHETER USING 23cm PALINDROME CHRONIC CATHETER;  Surgeon: Dannis Dy, MD;  Location: [facility] OR;  Service: Vascular;  Laterality: Left;   IR REMOVAL TUN CV CATH W/O FL  03/30/2021   JOINT REPLACEMENT     Bilateral knees   LIGATION ARTERIOVENOUS GORTEX GRAFT Right 03/03/2023   Procedure: LIGATION RIGHT ARM ARTERIOVENOUS GORTEX GRAFT;  Surgeon: Adine Hoof, MD;  Location: [facility] OR;  Service: Vascular;  Laterality: Right;   PERIPHERAL VASCULAR BALLOON ANGIOPLASTY Right 08/30/2021   Procedure: PERIPHERAL VASCULAR BALLOON ANGIOPLASTY;  Surgeon: Adine Hoof, MD;  Location: [facility] INVASIVE CV LAB;  Service: Cardiovascular;  Laterality: Right;   PERIPHERAL VASCULAR BALLOON ANGIOPLASTY Right 07/28/2022   Procedure: PERIPHERAL VASCULAR BALLOON ANGIOPLASTY;  Surgeon: Adine Hoof, MD;  Location: [facility] INVASIVE CV LAB;  Service: Cardiovascular;  Laterality: Right;  AVF   PERIPHERAL VASCULAR INTERVENTION  11/08/2021   Procedure: PERIPHERAL VASCULAR INTERVENTION;  Surgeon: Adine Hoof, MD;  Location: [facility] INVASIVE CV LAB;  Service: Cardiovascular;;  Central rt arm fistula   UPPER EXTREMITY VENOGRAPHY Right 08/10/2021   Procedure: UPPER EXTREMITY VENOGRAPHY;  Surgeon: Margherita Shell, MD;  Location: [facility] INVASIVE CV LAB;  Service: Cardiovascular;  Laterality: Right;     Social History:   reports that he quit smoking about 13 years ago. His smoking use included cigarettes. He has never used smokeless tobacco. He reports current alcohol  use. He reports that he does not currently use drugs.    Family History:  His family history is not on file.   Allergies Allergies  Allergen Reactions   Actos [Pioglitazone] Anaphylaxis and Rash   Dexmedetomidine  Nausea And Vomiting and Other (See Comments)    (Precedex ) Dose-limiting bradycardia     Ibuprofen Shortness Of Breath, Swelling and Other (See Comments)    Pt tolerates aspirin     Tomato Anaphylaxis and Other (See Comments)    Only allergic to RAW tomatoes   Wellbutrin [Bupropion] Swelling     Home Medications  Prior to Admission medications   Medication Sig Start Date End Date Taking? Authorizing Provider  acetaminophen  (TYLENOL ) 325 MG tablet Take 2 tablets (650 mg total) by mouth every 6 (six) hours. 07/23/23   Santa Cuba, MD  amLODipine  (NORVASC ) 10 MG tablet Take 10 mg by mouth every morning. 04/17/20   [provider]  ASPIRIN  LOW DOSE 81 MG EC tablet Take 81 mg by mouth in the morning. 02/22/21   [provider]  atorvastatin  (LIPITOR) 40 MG tablet Take 40 mg by mouth in the morning. 02/09/21  [provider]  calcium  acetate (PHOSLO ) 667 MG capsule Take 2 capsules (1,334 mg total) by mouth with breakfast, with lunch, and with evening meal. 12/21/23   Ejigiri, Juanda Noon, PA-C  carvedilol  (COREG ) 25 MG tablet Take 1 tablet (25 mg total) by mouth 2 (two) times daily with a meal. 03/26/24   Samtani, Jai-Gurmukh, MD  cyclobenzaprine  (FLEXERIL ) 10 MG tablet Take 10 mg by mouth 3 (three) times daily. 10/14/23   [provider]  diazepam  (VALIUM ) 5 MG tablet Take 5 mg by mouth 2 (two) times daily as needed for muscle spasms. 12/09/21   [provider]  HYDROcodone -acetaminophen  (NORCO/VICODIN) 5-325 MG tablet Take 1 tablet by mouth every 4 (four) hours as needed for moderate pain (pain score 4-6). 03/26/24   Samtani, Jai-Gurmukh, MD  insulin  lispro (HUMALOG  KWIKPEN) 100 UNIT/ML KwikPen Inject 0-9 Units into the skin 3 (three) times daily with meals. CBG < 70: treat low blood  sugar CBG 70 - 120: 0 units  CBG 121 - 150: 1 unit  CBG 151 - 200: 2 units  CBG 201 - 250: 3 units  CBG 251 - 300: 5 units  CBG 301 - 350: 7 units  CBG 351 - 400: 9 units  CBG > 400: call MD 09/27/23   Etter Hermann., MD  insulin  regular human CONCENTRATED (HUMULIN  R U-500 KWIKPEN) 500 UNIT/ML KwikPen Inject 55 Units into the skin 2 (two) times daily with a meal. Follow up with your PCP and [facility] for additional adjustments 09/27/23 03/21/24  Etter Hermann., MD  iron  polysaccharides (NIFEREX) 150 MG capsule Take 1 capsule (150 mg total) by mouth daily. 09/26/23   Ozell Blunt, MD  nortriptyline  (PAMELOR ) 10 MG capsule Take 20 mg by mouth at bedtime.    [provider]  [facility] VERIO test strip 1 each by Other route as needed. 03/04/24   [provider]  pantoprazole  (PROTONIX ) 40 MG tablet Take 40 mg by mouth daily before breakfast. 02/09/21   [provider]  pregabalin  (LYRICA ) 100 MG capsule Take 100 mg by mouth. 03/20/24 04/19/24  [provider]  sertraline  (ZOLOFT ) 100 MG tablet Take 100 mg by mouth in the morning. 02/22/21   [provider]  tamsulosin  (FLOMAX ) 0.4 MG CAPS capsule Take 1 capsule (0.4 mg total) by mouth daily. 11/10/21   Modena Andes, MD  traZODone  (DESYREL ) 150 MG tablet Take 150 mg by mouth at bedtime. 12/09/21   [provider]  TRELEGY ELLIPTA 100-62.5-25 MCG/ACT AEPB Take 1 puff by mouth daily. 02/19/24   [provider]  Vitamin D , Ergocalciferol , (DRISDOL ) 1.25 MG (50000 UNIT) CAPS capsule Take 1 capsule (50,000 Units total) by mouth every 7 (seven) days. Patient not taking: Reported on 03/21/2024 10/02/23   Ozell Blunt, MD     Critical care time: 45 min    Madelynn Schilder, MD [facility]

## 2024-04-09 NOTE — Progress Notes (Addendum)
 Received patient in bed to unit.  Alert and oriented.  Informed consent signed and in chart.   TX duration:3 hours and 30 minutes at bedside  Patient tolerated well.  Transported back to the room  Alert, without acute distress.  Hand-off given to patient's nurse.   Access used: Left Upper Arm Graft Access issues: BFR to 300 due to high venous pressures  Total UF removed: 3L Medication(s) given: I did not give any   04/09/24 1129  Vitals  Temp 99.3 F (37.4 C)  BP (!) 207/80  MAP (mmHg) 127  Patient Position (if appropriate) Lying  Pulse Rate 96  Pulse Rate Source Monitor  ECG Heart Rate 97  Resp (!) 26  Oxygen Therapy  SpO2 98 %  O2 Device Ventilator  During Treatment Monitoring  Duration of HD Treatment -hour(s) 3.5 hour(s)  HD Safety Checks Performed Yes  Intra-Hemodialysis Comments Tx completed  Dialysis Fluid Bolus Normal Saline  Bolus Amount (mL) 300 mL  Post Treatment  Dialyzer Clearance Clear  Liters Processed 62.2  Fluid Removed (mL) 3000 mL  Tolerated HD Treatment Yes  AVG/AVF Arterial Site Held (minutes) 7 minutes  AVG/AVF Venous Site Held (minutes) 7 minutes  Fistula / Graft Left Upper arm Arteriovenous vein graft  Placement Date/Time: (c) 04/22/23 (c) 1000   Orientation: Left  Access Location: Upper arm  Access Type: Arteriovenous vein graft  Status Deaccessed     Luciano Ruths LPN [facility]

## 2024-04-10 DIAGNOSIS — J9601 Acute respiratory failure with hypoxia: Secondary | ICD-10-CM

## 2024-04-10 DIAGNOSIS — N186 End stage renal disease: Secondary | ICD-10-CM | POA: Diagnosis not present

## 2024-04-10 DIAGNOSIS — I469 Cardiac arrest, cause unspecified: Secondary | ICD-10-CM | POA: Diagnosis not present

## 2024-04-10 DIAGNOSIS — J9602 Acute respiratory failure with hypercapnia: Secondary | ICD-10-CM | POA: Diagnosis not present

## 2024-04-10 LAB — POCT I-STAT 7, (LYTES, BLD GAS, ICA,H+H)
Acid-Base Excess: 1 mmol/L (ref 0.0–2.0)
Bicarbonate: 26.4 mmol/L (ref 20.0–28.0)
Calcium, Ion: 1.11 mmol/L — ABNORMAL LOW (ref 1.15–1.40)
HCT: 22 % — ABNORMAL LOW (ref 39.0–52.0)
Hemoglobin: 7.5 g/dL — ABNORMAL LOW (ref 13.0–17.0)
O2 Saturation: 94 %
Patient temperature: 37
Potassium: 4 mmol/L (ref 3.5–5.1)
Sodium: 134 mmol/L — ABNORMAL LOW (ref 135–145)
TCO2: 28 mmol/L (ref 22–32)
pCO2 arterial: 46.7 mmHg (ref 32–48)
pH, Arterial: 7.36 (ref 7.35–7.45)
pO2, Arterial: 73 mmHg — ABNORMAL LOW (ref 83–108)

## 2024-04-10 LAB — HEPATIC FUNCTION PANEL
ALT: 530 U/L — ABNORMAL HIGH (ref 0–44)
AST: 172 U/L — ABNORMAL HIGH (ref 15–41)
Albumin: 2.8 g/dL — ABNORMAL LOW (ref 3.5–5.0)
Alkaline Phosphatase: 84 U/L (ref 38–126)
Bilirubin, Direct: 0.1 mg/dL (ref 0.0–0.2)
Indirect Bilirubin: 0.3 mg/dL (ref 0.3–0.9)
Total Bilirubin: 0.4 mg/dL (ref 0.0–1.2)
Total Protein: 6.2 g/dL — ABNORMAL LOW (ref 6.5–8.1)

## 2024-04-10 LAB — MAGNESIUM
Magnesium: 1.8 mg/dL (ref 1.7–2.4)
Magnesium: 1.9 mg/dL (ref 1.7–2.4)
Magnesium: 2.4 mg/dL (ref 1.7–2.4)

## 2024-04-10 LAB — GLUCOSE, CAPILLARY
Glucose-Capillary: 126 mg/dL — ABNORMAL HIGH (ref 70–99)
Glucose-Capillary: 131 mg/dL — ABNORMAL HIGH (ref 70–99)
Glucose-Capillary: 147 mg/dL — ABNORMAL HIGH (ref 70–99)
Glucose-Capillary: 149 mg/dL — ABNORMAL HIGH (ref 70–99)
Glucose-Capillary: 155 mg/dL — ABNORMAL HIGH (ref 70–99)
Glucose-Capillary: 160 mg/dL — ABNORMAL HIGH (ref 70–99)
Glucose-Capillary: 162 mg/dL — ABNORMAL HIGH (ref 70–99)
Glucose-Capillary: 163 mg/dL — ABNORMAL HIGH (ref 70–99)
Glucose-Capillary: 178 mg/dL — ABNORMAL HIGH (ref 70–99)
Glucose-Capillary: 180 mg/dL — ABNORMAL HIGH (ref 70–99)
Glucose-Capillary: 183 mg/dL — ABNORMAL HIGH (ref 70–99)
Glucose-Capillary: 194 mg/dL — ABNORMAL HIGH (ref 70–99)
Glucose-Capillary: 195 mg/dL — ABNORMAL HIGH (ref 70–99)
Glucose-Capillary: 198 mg/dL — ABNORMAL HIGH (ref 70–99)
Glucose-Capillary: 202 mg/dL — ABNORMAL HIGH (ref 70–99)
Glucose-Capillary: 204 mg/dL — ABNORMAL HIGH (ref 70–99)
Glucose-Capillary: 216 mg/dL — ABNORMAL HIGH (ref 70–99)
Glucose-Capillary: 222 mg/dL — ABNORMAL HIGH (ref 70–99)
Glucose-Capillary: 238 mg/dL — ABNORMAL HIGH (ref 70–99)
Glucose-Capillary: 243 mg/dL — ABNORMAL HIGH (ref 70–99)

## 2024-04-10 LAB — BASIC METABOLIC PANEL WITH GFR
Anion gap: 12 (ref 5–15)
Anion gap: 14 (ref 5–15)
BUN: 58 mg/dL — ABNORMAL HIGH (ref 8–23)
BUN: 64 mg/dL — ABNORMAL HIGH (ref 8–23)
CO2: 27 mmol/L (ref 22–32)
CO2: 24 mmol/L (ref 22–32)
Calcium: 8.3 mg/dL — ABNORMAL LOW (ref 8.9–10.3)
Calcium: 8.4 mg/dL — ABNORMAL LOW (ref 8.9–10.3)
Chloride: 98 mmol/L (ref 98–111)
Chloride: 97 mmol/L — ABNORMAL LOW (ref 98–111)
Creatinine, Ser: 5.12 mg/dL — ABNORMAL HIGH (ref 0.61–1.24)
Creatinine, Ser: 5.37 mg/dL — ABNORMAL HIGH (ref 0.61–1.24)
GFR, Estimated: 12 mL/min — ABNORMAL LOW (ref >60)
GFR, Estimated: 11 mL/min — ABNORMAL LOW (ref >60)
Glucose, Bld: 217 mg/dL — ABNORMAL HIGH (ref 70–99)
Glucose, Bld: 227 mg/dL — ABNORMAL HIGH (ref 70–99)
Potassium: 4.1 mmol/L (ref 3.5–5.1)
Potassium: 3.9 mmol/L (ref 3.5–5.1)
Sodium: 137 mmol/L (ref 135–145)
Sodium: 135 mmol/L (ref 135–145)

## 2024-04-10 LAB — TRIGLYCERIDES: Triglycerides: 126 mg/dL (ref <150)

## 2024-04-10 LAB — HEPATITIS B SURFACE ANTIBODY, QUANTITATIVE: Hep B S AB Quant (Post): 25.6 m[IU]/mL

## 2024-04-10 LAB — PHOSPHORUS
Phosphorus: 3.8 mg/dL (ref 2.5–4.6)
Phosphorus: 3.5 mg/dL (ref 2.5–4.6)
Phosphorus: 3.7 mg/dL (ref 2.5–4.6)

## 2024-04-10 MED ORDER — OSMOLITE 1.5 CAL PO LIQD
1000.0000 mL | ORAL | Status: DC
  Administered 2024-04-10 – 2024-04-12 (×2): 1000 mL

## 2024-04-10 MED ORDER — INSULIN GLARGINE-YFGN 100 UNIT/ML ~~LOC~~ SOLN
0.1500 [IU]/kg | Freq: Every day | SUBCUTANEOUS | Status: DC
Start: 2024-04-10 — End: 2024-04-10
  Filled 2024-04-10 (×2): qty 0.21

## 2024-04-10 MED ORDER — INSULIN ASPART 100 UNIT/ML IJ SOLN
3.0000 [IU] | Freq: Four times a day (QID) | INTRAMUSCULAR | Status: DC
  Administered 2024-04-11: 3 [IU] via SUBCUTANEOUS

## 2024-04-10 MED ORDER — VASOPRESSIN 20 UNITS/100 ML INFUSION FOR SHOCK: 0.0000 [IU]/min | INTRAVENOUS | Status: DC

## 2024-04-10 MED ORDER — PROSOURCE TF20 ENFIT COMPATIBL EN LIQD
60.0000 mL | Freq: Two times a day (BID) | ENTERAL | Status: DC
  Administered 2024-04-10 – 2024-04-11 (×2): 60 mL
  Administered 2024-04-11: 30 mL
  Administered 2024-04-12 – 2024-04-13 (×3): 60 mL
  Filled 2024-04-10 (×6): qty 60

## 2024-04-10 MED ORDER — INSULIN GLARGINE-YFGN 100 UNIT/ML ~~LOC~~ SOLN
0.3000 [IU]/kg | Freq: Every day | SUBCUTANEOUS | Status: DC
  Filled 2024-04-10: qty 0.42

## 2024-04-10 MED ORDER — HYDROMORPHONE HCL 1 MG/ML IJ SOLN: 0.5000 mg | INTRAMUSCULAR | Status: DC | PRN

## 2024-04-10 MED ORDER — INSULIN GLARGINE-YFGN 100 UNIT/ML ~~LOC~~ SOLN
30.0000 [IU] | Freq: Once | SUBCUTANEOUS | Status: AC
  Administered 2024-04-10: 30 [IU] via SUBCUTANEOUS
  Filled 2024-04-10: qty 0.3

## 2024-04-10 MED ORDER — ALBUMIN HUMAN 25 % IV SOLN
25.0000 g | Freq: Once | INTRAVENOUS | Status: AC
  Administered 2024-04-10: 25 g via INTRAVENOUS
  Filled 2024-04-10: qty 100

## 2024-04-10 MED ORDER — IPRATROPIUM-ALBUTEROL 0.5-2.5 (3) MG/3ML IN SOLN: 3.0000 mL | RESPIRATORY_TRACT | Status: DC | PRN

## 2024-04-10 MED ORDER — DEXTROSE 50 % IV SOLN: 0.0000 mL | INTRAVENOUS | Status: DC | PRN

## 2024-04-10 MED ORDER — CHLORHEXIDINE GLUCONATE CLOTH 2 % EX PADS
6.0000 | MEDICATED_PAD | Freq: Every day | CUTANEOUS | Status: DC
  Administered 2024-04-11: 6 via TOPICAL

## 2024-04-10 MED ORDER — INSULIN ASPART 100 UNIT/ML IJ SOLN
1.0000 [IU] | INTRAMUSCULAR | Status: DC
  Administered 2024-04-11: 3 [IU] via SUBCUTANEOUS
  Administered 2024-04-11: 2 [IU] via SUBCUTANEOUS
  Administered 2024-04-11: 3 [IU] via SUBCUTANEOUS
  Administered 2024-04-11: 2 [IU] via SUBCUTANEOUS

## 2024-04-10 NOTE — Plan of Care (Signed)
  Problem: Education: Goal: Knowledge of General Education information will improve Description: Including pain rating scale, medication(s)/side effects and non-pharmacologic comfort measures Outcome: Not Progressing   Problem: Clinical Measurements: Goal: Ability to maintain clinical measurements within normal limits will improve Outcome: Not Progressing Goal: Diagnostic test results will improve Outcome: Not Progressing Goal: Respiratory complications will improve Outcome: Not Progressing

## 2024-04-10 NOTE — Progress Notes (Signed)
 Initial Nutrition Assessment  DOCUMENTATION CODES:   Morbid obesity  INTERVENTION:  TF via OGT: Transition to Osmolite 1.5 at 10ml/hr and advance by 10ml q10h to goal rate of 76ml/hr ( per day) Prosource TF20 60 ml BID Provides 1960 kcal, 115 gm protein, 914 ml free water daily  NUTRITION DIAGNOSIS:   Inadequate oral intake related to acute illness as evidenced by NPO status.  GOAL:   Patient will meet greater than or equal to 90% of their needs  MONITOR:   Weight trends, Vent status, Labs, TF tolerance, I & O's  REASON FOR ASSESSMENT:  Consult, Ventilator Assessment of nutrition requirement/status  ASSESSMENT:  Pt admitted post cardiac arrest x2. PMH significant for ESRD on HD (poor compliance), OSA, T2DM, HTN, HLD, chronic anemia, COPD, L AKA.  5/12: admitted post cardiac arrest; intubated  5/13: HD- net UF 3L  Patient is currently intubated on ventilator support MV: 12 L/min Temp (24hrs), Avg:98.6 F (37 C), Min:97.3 F (36.3 C), Max:100.9 F (38.3 C) MAP (a-line): 91, improved from 45 this morning  PMT following to establish GOC.  Checked in with patient at bedside. No family present at that time. Unable to obtain detailed nutrition related history.   VitalHP infusing at time of visit at 42ml/hr per TF protocol.  Currently tolerating TF well.  Will transition TF formula to better meet patient's nutrition needs.   Pt discussed in IDT rounds.  Pt on TTM.  Per RN, MAP declined once cardene stopped. Levo initiated. Reverse trendelenburg. MAP improved  EDW 129 kg Current weight: 140.8 kg Admit weight: 142.7 kg  Drains/lines: UOP: x24 hours OGT (tip within stomach) LUE AVG R femoral triple lumen CVC A line  Medications: colace BID, miralax  Drips: Abx Insulin  gtts Propofol  @ 4.26ml/hr   Labs:  BUN 58 Cr 5.12 AST 172 ALT 530 GFR 12 CBG's 126-238 x24 hours HgbA1c 8.5% (4/25)  NUTRITION - FOCUSED PHYSICAL EXAM: NFPE performed  however pt with severe non-pitting edema to BUE, RLE and abdomen. Will need repeat exam once edema improves.   Diet Order:   Diet Order             Diet NPO time specified  Diet effective now                   EDUCATION NEEDS:  Not appropriate for education at this time  Skin:  Skin Assessment: Reviewed RN Assessment  Last BM:  unknown/PTA  Height:  Ht Readings from Last 1 Encounters:  04/09/24 5\' 5"  (1.651 m)    Weight:  Wt Readings from Last 1 Encounters:  04/10/24 (!) 140.8 kg    Ideal Body Weight:  61.8 kg  BMI:  Body mass index is 51.65 kg/m.  Estimated Nutritional Needs:   Kcal:  1900-2100  Protein:  105-120g  Fluid:  1L + UOP  Rocklin Chute, RDN, LDN Clinical Nutrition See AMiON for contact information.

## 2024-04-10 NOTE — Progress Notes (Signed)
 Transitioned off cleviprex this am after enteral coreg  and amlodipine  started 5/13, now with worsening hypotension started on NE.   RN reports no significant improvement in BP, now on but was placed in reverse [place] with immediate improvement in BP c/w hypovolemia.  S/p HD 5/13 w/ 3L UF   - albumin  bolus and continue to reassess, may need additional fluids - cont to wean NE, add vaso if NE > 10     Additional CCT 10 mins   Early Glisson, MSN, AG-ACNP-BC [facility] 04/10/2024, 12:12 PM  See Amion for pager If no response to pager , please call [phone] until 7pm After 7:00 pm call Elink  [phone]

## 2024-04-10 NOTE — Progress Notes (Addendum)
 NAME:  Jeffrey [name], MRN:  [id number], DOB:  [birth year], LOS: 2 ADMISSION DATE:  04/08/2024, CONSULTATION DATE: 04/09/2024 REFERRING MD: EDP, CHIEF COMPLAINT: cardiac arrest    History of Present Illness:  A 63 yo male poorly compliant with dialysis (leaves ama as well), presented to hospital for sessions of late; recent hospitalization late April for undetermined arrhythmia who called ems with resp distress found to be in pea and acls started with EMS services. Reportedly missed multiple sessions of dialysis last week. Per chart review pt was in PEA, undergoing ACLS for ~23 mins until ROSC and unfortunately deteriorated shortly after again in PEA ~10 mins with ROSC now on escalated pressors and inotropic support.    CXR revealed pulmonary edema. EDP was gracious to place CVC and arterial line for pt. Unable to reach family at this time. CCM was asked to admit pt. All history is obtained from chart review as pt is intubated and unresponsive.    Cth pending   Pt seen with R facial twitching, unresponsive on low dose dose propofol , no purposeful movement. On 20epi and 0.03 vasopressin . NE on hold. Pt was given TNK during code and has epistaxis and bleeding from line sites. Labs being sent now.   Pertinent  Medical History  ESRD on HD, poorly adherent OSA, intermittent compliance with cpap T2DM with hyperglycemia HTN Hyperlipidemia Chronic anemia 2/2 ESRD COPD Chronic hypoxic resp failure on 3L baseline  Significant Hospital Events: Including procedures, antibiotic start and stop dates in addition to other pertinent events   5/12: admitted to ICU post arrest  5/13: on HD, Propofol  for sedation, PRVC 26/490/+8/70%, ABG 7.29/56/75/93%. Positive fluid balance 1.3 since admission.  5/14: hypotension in am, off Levophed since noon time after IV Albumin , on insulin  drip, on NS 0.9% @ 75 cc/hr, PRVC 26/490/+8/50%. On Propofol  and Fentanyl , on TTM. ABG is pending. Negative 1.1 L with HD, now  positive 0.6 L fluid balance in the last few hrs. On TF  Interim History / Subjective:    Objective    Blood pressure (!) 141/39, pulse 80, temperature 98.8 F (37.1 C), temperature source Bladder, resp. rate (!) 26, height 5\' 5"  (1.651 m), weight (!) 140.8 kg, SpO2 97%.    Vent Mode: PRVC FiO2 (%):  [50 %-60 %] 50 % Set Rate:  [26 bmp] 26 bmp Vt Set:  [490 mL] 490 mL PEEP:  [8 cmH20] 8 cmH20 Plateau Pressure:  [25 cmH20-28 cmH20] 27 cmH20   Intake/Output Summary (Last 24 hours) at 04/10/2024 1343 Last data filed at 04/10/2024 1200 Gross per 24 hour  Intake 3288.8 ml  Output 655 ml  Net 2633.8 ml   Filed Weights   04/09/24 0728 04/09/24 1154 04/10/24 0326  Weight: (!) 142.7 kg (!) 139.8 kg (!) 140.8 kg    Examination: General: sedated, and comfortable. SpO2 99%  HENT: Pupils are equal but not reactive. No LNE or thyromegaly. ?JVD Lungs: symmetrical air entry bilaterally. No crackles or wheezing Cardiovascular: NL S1/S2. No m/g/r Abdomen: no distension or tenderness Extremities: left AKA. Right femoral CVC and a-line.   Neuro: no gag or corneal reflexes    Resolved problem list   Assessment and Plan  Acute OOH cardiac arrest PEA twice (23 min and 10 min), s/p ROSC -Maintain Temp <37.5 C, on TTM protocol  -PRN vasopressors , MAP >65 mmHg -Serial LA: normalized  -Dietitian for TF: on TF -Palliative care consult: noted -Sedation per TTM protocol   Acute on chronic hypoxic, and acute  hypercarbic resp failure Possible aspiration PNA. MRSA screening is positive  COPD, HOT 3L, not in acute exacerbation -VAP and PAD protocols -ABG and PCXR PRN -Vanc and Cefepime  -f/u Cx -Duoneb PRN  Shock, cardiogenic: resolved. Echo: EF 60%, G1DD Lactic acidosis: resolved  -Off Levophed  Shock liver: better -Monitor LFT  Seizure like activity, ? Myoclonus from hypoxic injury -Continue Keppra for now  -f/u EEG reading: negative for seizures   ESRD on HD Hyperkalemia -On  HD -appreciate nephrology input -Serial labs  Acute pulmonary edema, HTN, D-CHF (03/22/2024: Echo EF 60%, G2DD) -BP control -HD  Anemia of chronic disease -Monitor CBC  DM-2 with hyperglycemia -Glycemic control 140-180 mg/dl - Insulin  drip   OSA with limited compliance with CPAP Morbid obesity   GERD -PPI  Best Practice (right click and "Reselect all SmartList Selections" daily)   Diet/type: TF DVT prophylaxis prophylactic heparin   Pressure ulcer(s): N/A GI prophylaxis: PPI Lines: Central line and Arterial Line Foley:  N/A Code Status:  full code Last date of multidisciplinary goals of care discussion []  D/w with family and palliative care team   Labs   CBC: Recent Labs  Lab 04/04/24 0756 04/08/24 2140 04/08/24 2148 04/08/24 2150 04/08/24 2227 04/09/24 0030 04/09/24 0326  WBC 4.3 14.0*  --   --   --  9.4  --   NEUTROABS 3.2 11.0*  --   --   --   --   --   HGB 7.8* 8.5* 8.5* 8.8* 8.5* 8.4* 8.2*  HCT 25.2* 29.2* 25.0* 26.0* 25.0* 27.9* 24.0*  MCV 93.7 99.0  --   --   --  94.9  --   PLT 108* 127*  --   --   --  121*  124*  --     Basic Metabolic Panel: Recent Labs  Lab 04/04/24 0756 04/08/24 2140 04/08/24 2148 04/08/24 2150 04/08/24 2227 04/09/24 0030 04/09/24 0037 04/09/24 0326 04/09/24 1332 04/09/24 2318 04/10/24 0407 04/10/24 1043  NA 137 136   < > 136 136  --  134* 134*  --  137  --  135  K 4.9 5.2*   < > 5.3* 5.4*  --  7.1* 6.3*  --  4.1  --  3.9  CL 98 100  --  102  --   --  98  --   --  98  --  97*  CO2 26 24  --   --   --   --  25  --   --  27  --  24  GLUCOSE 195* 394*  --  400*  --   --  426*  --   --  217*  --  227*  BUN 72* 69*  --  76*  --   --  75*  --   --  58*  --  64*  CREATININE 5.68* 6.36*  --  5.90*  --  6.56* 6.47*  --   --  5.12*  --  5.37*  CALCIUM  8.1* 8.9  --   --   --   --  8.4*  --   --  8.3*  --  8.4*  MG 1.9  --   --   --   --   --  2.1  --  1.8 1.8 1.9  --   PHOS >30.0*  --   --   --   --   --   --   --  4.0 3.8  3.5  --    < > =  values in this interval not displayed.   GFR: Estimated Creatinine Clearance: 18.6 mL/min (A) (by C-G formula based on SCr of 5.37 mg/dL (H)). Recent Labs  Lab 04/04/24 0756 04/08/24 2140 04/08/24 2218 04/09/24 0030  WBC 4.3 14.0*  --  9.4  LATICACIDVEN  --   --  3.4* 1.1    Liver Function Tests: Recent Labs  Lab 04/04/24 0756 04/08/24 2140 04/10/24 0407  AST  --  529* 172*  ALT  --  637* 530*  ALKPHOS  --  92 84  BILITOT  --  0.5 0.4  PROT  --  5.8* 6.2*  ALBUMIN  3.3* 2.8* 2.8*   No results for input(s): "LIPASE", "AMYLASE" in the last 168 hours. No results for input(s): "AMMONIA" in the last 168 hours.  ABG    Component Value Date/Time   PHART 7.292 (L) 04/09/2024 0326   PCO2ART 55.8 (H) 04/09/2024 0326   PO2ART 75 (L) 04/09/2024 0326   HCO3 27.1 04/09/2024 0326   TCO2 29 04/09/2024 0326   ACIDBASEDEF 1.4 04/08/2024 2227   ACIDBASEDEF 1.0 04/08/2024 2227   O2SAT 93 04/09/2024 0326     Coagulation Profile: Recent Labs  Lab 04/09/24 0030  INR 1.5*    Cardiac Enzymes: No results for input(s): "CKTOTAL", "CKMB", "CKMBINDEX", "TROPONINI" in the last 168 hours.  HbA1C: Hgb A1c MFr Bld  Date/Time Value Ref Range Status  03/22/2024 04:38 AM 8.5 (H) 4.8 - 5.6 % Final    Comment:    (NOTE) Pre diabetes:          5.7%-6.4%  Diabetes:              >6.4%  Glycemic control for   <7.0% adults with diabetes   09/17/2023 02:27 AM 11.1 (H) 4.8 - 5.6 % Final    Comment:    (NOTE) Pre diabetes:          5.7%-6.4%  Diabetes:              >6.4%  Glycemic control for   <7.0% adults with diabetes     CBG: Recent Labs  Lab 04/10/24 0756 04/10/24 0922 04/10/24 1045 04/10/24 1126 04/10/24 1226  GLUCAP 238* 243* 222* 204* 155*    Review of Systems:   Intubated   Past Medical History:  He,  has a past medical history of Altered mental status (05/04/2021), Arthritis, Blood transfusion without reported diagnosis, CHF (congestive heart  failure) (HCC), COPD (chronic obstructive pulmonary disease) (HCC), Depression, Diabetes mellitus without complication (HCC), Dyspnea, ESRD on hemodialysis (HCC), GERD (gastroesophageal reflux disease), Heart murmur, Hypertension, Myocardial infarction (HCC), PTSD (post-traumatic stress disorder), Sepsis (HCC), Sleep apnea, and Uses powered wheelchair.   Surgical History:   Past Surgical History:  Procedure Laterality Date   A/V FISTULAGRAM N/A 08/30/2021   Procedure: A/V FISTULAGRAM;  Surgeon: Adine Hoof, MD;  Location: [facility] INVASIVE CV LAB;  Service: Cardiovascular;  Laterality: N/A;   A/V FISTULAGRAM Right 07/28/2022   Procedure: A/V Fistulagram;  Surgeon: Adine Hoof, MD;  Location: [facility] INVASIVE CV LAB;  Service: Cardiovascular;  Laterality: Right;   A/V FISTULAGRAM Right 09/05/2022   Procedure: A/V Fistulagram;  Surgeon: Adine Hoof, MD;  Location: [facility] INVASIVE CV LAB;  Service: Cardiovascular;  Laterality: Right;   A/V FISTULAGRAM Right 10/31/2022   Procedure: A/V Fistulagram;  Surgeon: Adine Hoof, MD;  Location: [facility] INVASIVE CV LAB;  Service: Cardiovascular;  Laterality: Right;   A/V SHUNT INTERVENTION Right 10/31/2022   Procedure: A/V SHUNT INTERVENTION;  Surgeon: Adine Hoof, MD;  Location: [facility] INVASIVE CV LAB;  Service: Cardiovascular;  Laterality: Right;   A/V SHUNTOGRAM N/A 11/08/2021   Procedure: A/V SHUNTOGRAM;  Surgeon: Adine Hoof, MD;  Location: [facility] INVASIVE CV LAB;  Service: Cardiovascular;  Laterality: N/A;   AMPUTATION Left 04/03/2021   Procedure: LEFT ABOVE-THE-KNEE AMPUTATION;  Surgeon: Donnamarie Gables, MD;  Location: [facility] OR;  Service: Orthopedics;  Laterality: Left;   AV FISTULA PLACEMENT Right 08/12/2021   Procedure: INSERTION OF ARTERIOVENOUS (AV) GORE-TEX GRAFT RIGHT ARM;  Surgeon: Margherita Shell, MD;  Location: [facility] OR;  Service: Vascular;  Laterality: Right;   AV FISTULA PLACEMENT Left  04/07/2023   Procedure: ARTERIOVENOUS (AV) FISTULA GRAFT USING GORETEX STRETCH (4-7MM);  Surgeon: Dannis Dy, MD;  Location: [facility] OR;  Service: Vascular;  Laterality: Left;   INSERTION OF DIALYSIS CATHETER N/A 11/25/2022   Procedure: INSERTION OF DIALYSIS CATHETER;  Surgeon: Josiah Nigh, MD;  Location: [facility] ENDOSCOPY;  Service: Pulmonary;  Laterality: N/A;   INSERTION OF DIALYSIS CATHETER N/A 03/03/2023   Procedure: INSERTION OF TUNNELED DIALYSIS CATHETER;  Surgeon: Adine Hoof, MD;  Location: [facility] OR;  Service: Vascular;  Laterality: N/A;   INSERTION OF DIALYSIS CATHETER Left 04/07/2023   Procedure: INSERTION OF DIALYSIS CATHETER USING PALINDROME  28CM PRECISION CHRONIC CATHETER KIT;  Surgeon: Dannis Dy, MD;  Location: [facility] OR;  Service: Vascular;  Laterality: Left;   INSERTION OF DIALYSIS CATHETER Left 07/18/2023   Procedure: INSERTION OF TUNNELED DIALYSIS CATHETER USING 23cm PALINDROME CHRONIC CATHETER;  Surgeon: Dannis Dy, MD;  Location: [facility] OR;  Service: Vascular;  Laterality: Left;   IR REMOVAL TUN CV CATH W/O FL  03/30/2021   JOINT REPLACEMENT     Bilateral knees   LIGATION ARTERIOVENOUS GORTEX GRAFT Right 03/03/2023   Procedure: LIGATION RIGHT ARM ARTERIOVENOUS GORTEX GRAFT;  Surgeon: Adine Hoof, MD;  Location: [facility] OR;  Service: Vascular;  Laterality: Right;   PERIPHERAL VASCULAR BALLOON ANGIOPLASTY Right 08/30/2021   Procedure: PERIPHERAL VASCULAR BALLOON ANGIOPLASTY;  Surgeon: Adine Hoof, MD;  Location: [facility] INVASIVE CV LAB;  Service: Cardiovascular;  Laterality: Right;   PERIPHERAL VASCULAR BALLOON ANGIOPLASTY Right 07/28/2022   Procedure: PERIPHERAL VASCULAR BALLOON ANGIOPLASTY;  Surgeon: Adine Hoof, MD;  Location: [facility] INVASIVE CV LAB;  Service: Cardiovascular;  Laterality: Right;  AVF   PERIPHERAL VASCULAR INTERVENTION  11/08/2021   Procedure: PERIPHERAL VASCULAR INTERVENTION;  Surgeon: Adine Hoof, MD;  Location: [facility] INVASIVE CV LAB;  Service: Cardiovascular;;  Central rt arm fistula   UPPER EXTREMITY VENOGRAPHY Right 08/10/2021   Procedure: UPPER EXTREMITY VENOGRAPHY;  Surgeon: Margherita Shell, MD;  Location: [facility] INVASIVE CV LAB;  Service: Cardiovascular;  Laterality: Right;     Social History:   reports that he quit smoking about 13 years ago. His smoking use included cigarettes. He has never used smokeless tobacco. He reports current alcohol  use. He reports that he does not currently use drugs.   Family History:  His family history is not on file.   Allergies Allergies  Allergen Reactions   Actos [Pioglitazone] Anaphylaxis and Rash   Dexmedetomidine  Nausea And Vomiting and Other (See Comments)    (Precedex ) Dose-limiting bradycardia     Ibuprofen Shortness Of Breath, Swelling and Other (See Comments)    Pt tolerates aspirin     Tomato Anaphylaxis and Other (See Comments)    Only allergic to RAW tomatoes   Wellbutrin [Bupropion] Swelling     Home Medications  Prior to Admission medications   Medication Sig Start Date End Date Taking? Authorizing Provider  acetaminophen  (TYLENOL ) 325 MG tablet Take 2 tablets (650 mg total) by mouth every 6 (six) hours. 07/23/23   Santa Cuba, MD  amLODipine  (NORVASC ) 10 MG tablet Take 10 mg by mouth every morning. 04/17/20   [provider]  ASPIRIN  LOW DOSE 81 MG EC tablet Take 81 mg by mouth in the morning. 02/22/21   [provider]  atorvastatin  (LIPITOR) 40 MG tablet Take 40 mg by mouth in the morning. 02/09/21   [provider]  calcium  acetate (PHOSLO ) 667 MG capsule Take 2 capsules (1,334 mg total) by mouth with breakfast, with lunch, and with evening meal. 12/21/23   Ejigiri, Juanda Noon, PA-C  carvedilol  (COREG ) 25 MG tablet Take 1 tablet (25 mg total) by mouth 2 (two) times daily with a meal. 03/26/24   Samtani, Jai-Gurmukh, MD  cyclobenzaprine  (FLEXERIL ) 10 MG tablet Take 10 mg by mouth 3  (three) times daily. 10/14/23   [provider]  diazepam  (VALIUM ) 5 MG tablet Take 5 mg by mouth 2 (two) times daily as needed for muscle spasms. 12/09/21   [provider]  HYDROcodone -acetaminophen  (NORCO/VICODIN) 5-325 MG tablet Take 1 tablet by mouth every 4 (four) hours as needed for moderate pain (pain score 4-6). 03/26/24   Samtani, Jai-Gurmukh, MD  insulin  lispro (HUMALOG  KWIKPEN) 100 UNIT/ML KwikPen Inject 0-9 Units into the skin 3 (three) times daily with meals. CBG < 70: treat low blood sugar CBG 70 - 120: 0 units  CBG 121 - 150: 1 unit  CBG 151 - 200: 2 units  CBG 201 - 250: 3 units  CBG 251 - 300: 5 units  CBG 301 - 350: 7 units  CBG 351 - 400: 9 units  CBG > 400: call MD 09/27/23   Etter Hermann., MD  insulin  regular human CONCENTRATED (HUMULIN  R U-500 KWIKPEN) 500 UNIT/ML KwikPen Inject 55 Units into the skin 2 (two) times daily with a meal. Follow up with your PCP and [facility] for additional adjustments 09/27/23 03/21/24  Etter Hermann., MD  iron  polysaccharides (NIFEREX) 150 MG capsule Take 1 capsule (150 mg total) by mouth daily. 09/26/23   Ozell Blunt, MD  nortriptyline  (PAMELOR ) 10 MG capsule Take 20 mg by mouth at bedtime.    [provider]  [facility] VERIO test strip 1 each by Other route as needed. 03/04/24   [provider]  pantoprazole  (PROTONIX ) 40 MG tablet Take 40 mg by mouth daily before breakfast. 02/09/21   [provider]  pregabalin  (LYRICA ) 100 MG capsule Take 100 mg by mouth. 03/20/24 04/19/24  [provider]  sertraline  (ZOLOFT ) 100 MG tablet Take 100 mg by mouth in the morning. 02/22/21   [provider]  tamsulosin  (FLOMAX ) 0.4 MG CAPS capsule Take 1 capsule (0.4 mg total) by mouth daily. 11/10/21   Modena Andes, MD  traZODone  (DESYREL ) 150 MG tablet Take 150 mg by mouth at bedtime. 12/09/21   [provider]  TRELEGY ELLIPTA 100-62.5-25 MCG/ACT AEPB Take 1 puff by  mouth daily. 02/19/24   [provider]  Vitamin D , Ergocalciferol , (DRISDOL ) 1.25 MG (50000 UNIT) CAPS capsule Take 1 capsule (50,000 Units total) by mouth every 7 (seven) days. Patient not taking: Reported on 03/21/2024 10/02/23   Ozell Blunt, MD     Critical care time: 72 min    Madelynn Schilder, MD [facility]

## 2024-04-10 NOTE — Progress Notes (Signed)
 [facility] Progress Note  Subjective:  BP's went up and nicardipine was started  Vitals:   04/10/24 1230 04/10/24 1245 04/10/24 1300 04/10/24 1400  BP:      Pulse: 82 81 80   Resp: (!) 26 (!) 26 (!) 26   Temp:   98.6 F (37 C) 98.6 F (37 C)  TempSrc:   Bladder Bladder  SpO2: 96% 96% 97%   Weight:      Height:        Exam: Gen on vent, sedated Sclera anicteric, throat w/ ETT No jvd or bruits Chest clear anterior RRR no MRG Abd soft markedly obese no mass or ascites +bs MS L AKA  Ext diffuse chronic 1-2+ bilat LE edema Neuro is on vent, sedated   AVG+bruit       Renal-related home meds: Norvasc  10 daily PhosLo  2 AC 3 times daily Coreg  25 twice daily Others: Trelegy Ellipta, trazodone , Flomax , Zoloft , Lyrica , PPI, nortriptyline , insulin , Norco, Valium , Lipitor, aspirin      Last OP HD orders (from dec 2023) --> 3:15h  129kg  RUE AVG   Heparin  2000   patient was discharged from [facility] due to behavior issues about 16 months ago    CXR 5/12 - bilat perihilar vasc congestion/ early IS edema    Assessment/ Plan: Cardiac arrest: 04/08/24, was down for about 23 min. Per CCM Vol overload: w/ vasc congestion/ early edema by 5/12 CXR. Had 3L UF on 5/13 yesterday. Max UF next HD.  ESRD: gets hospital HD, was released from [facility]'s care 16 mos ago. Had HD yest. Next HD Thursday.  BP: bp's high, is now on IV nicardipine gtt. Get vol down w/ HD Anemia of esrd: Hgb 7-9 here, follow Secondary hyperparathyroidism: Corrected calcium  in range, Phos pending.  Hold binders until eating. Hyperkalemia: Not severe initially then bumped up to 7.1, now is down to 4.0. Resolved.      Rob Torunn Chancellor MD  [facility] 04/10/2024, 3:06 PM  Recent Labs  Lab 04/08/24 2140 04/08/24 2148 04/09/24 0326 04/09/24 1332 04/09/24 2318 04/10/24 0407 04/10/24 1043 04/10/24 1413  HGB 8.5*   < > 8.2*  --   --   --   --  7.5*  ALBUMIN  2.8*  --   --   --   --  2.8*  --   --   CALCIUM  8.9   < >  --    --  8.3*  --  8.4*  --   PHOS  --   --   --    < > 3.8 3.5  --   --   CREATININE 6.36*   < >  --   --  5.12*  --  5.37*  --   K 5.2*   < > 6.3*  --  4.1  --  3.9 4.0   < > = values in this interval not displayed.   No results for input(s): "IRON ", "TIBC", "FERRITIN" in the last 168 hours. Inpatient medications:  acetaminophen   650 mg Per Tube Q4H   Or   acetaminophen  (TYLENOL ) oral liquid 160 mg/5 mL  650 mg Per Tube Q4H   Or   acetaminophen   650 mg Rectal Q4H   Chlorhexidine  Gluconate Cloth  6 each Topical Daily   Chlorhexidine  Gluconate Cloth  6 each Topical Q0600   docusate  100 mg Per Tube BID   feeding supplement (PROSource TF20)  60 mL Per Tube BID   heparin  injection (subcutaneous)  5,000 Units Subcutaneous  Q8H   levETIRAcetam  500 mg Intravenous Q12H   mupirocin  ointment   Nasal BID   mouth rinse  15 mL Mouth Rinse Q2H   pantoprazole  (PROTONIX ) IV  40 mg Intravenous Q24H   polyethylene glycol  17 g Per Tube Daily   sodium chloride  flush  3-10 mL Intravenous Q12H   vancomycin  variable dose per unstable renal function (pharmacist dosing)   Does not apply See admin instructions    ceFEPime  (MAXIPIME ) IV Stopped (04/09/24 2234)   feeding supplement (OSMOLITE 1.5 CAL)     fentaNYL  infusion INTRAVENOUS 50 mcg/hr (04/10/24 1200)   insulin  13 Units/hr (04/10/24 1200)   magnesium  sulfate Stopped (04/10/24 1016)   niCARDipine Stopped (04/10/24 1048)   norepinephrine (LEVOPHED) Adult infusion Stopped (04/09/24 2143)   propofol  (DIPRIVAN ) infusion 5 mcg/kg/min (04/10/24 1200)   [START ON 04/11/2024] acetaminophen  **OR** [START ON 04/11/2024] acetaminophen  (TYLENOL ) oral liquid 160 mg/5 mL **OR** [START ON 04/11/2024] acetaminophen , busPIRone **OR** busPIRone, dextrose , docusate, fentaNYL , magnesium  sulfate, ondansetron  (ZOFRAN ) IV, mouth rinse, polyethylene glycol, sodium chloride  flush

## 2024-04-11 DIAGNOSIS — J449 Chronic obstructive pulmonary disease, unspecified: Secondary | ICD-10-CM | POA: Diagnosis not present

## 2024-04-11 DIAGNOSIS — J9621 Acute and chronic respiratory failure with hypoxia: Secondary | ICD-10-CM | POA: Diagnosis not present

## 2024-04-11 DIAGNOSIS — J9622 Acute and chronic respiratory failure with hypercapnia: Secondary | ICD-10-CM | POA: Diagnosis not present

## 2024-04-11 DIAGNOSIS — I469 Cardiac arrest, cause unspecified: Secondary | ICD-10-CM | POA: Diagnosis not present

## 2024-04-11 LAB — COMPREHENSIVE METABOLIC PANEL WITH GFR
ALT: 283 U/L — ABNORMAL HIGH (ref 0–44)
AST: 53 U/L — ABNORMAL HIGH (ref 15–41)
Albumin: 2.7 g/dL — ABNORMAL LOW (ref 3.5–5.0)
Alkaline Phosphatase: 63 U/L (ref 38–126)
Anion gap: 13 (ref 5–15)
BUN: 79 mg/dL — ABNORMAL HIGH (ref 8–23)
CO2: 23 mmol/L (ref 22–32)
Calcium: 8 mg/dL — ABNORMAL LOW (ref 8.9–10.3)
Chloride: 100 mmol/L (ref 98–111)
Creatinine, Ser: 5.85 mg/dL — ABNORMAL HIGH (ref 0.61–1.24)
GFR, Estimated: 10 mL/min — ABNORMAL LOW (ref >60)
Glucose, Bld: 167 mg/dL — ABNORMAL HIGH (ref 70–99)
Potassium: 3.9 mmol/L (ref 3.5–5.1)
Sodium: 136 mmol/L (ref 135–145)
Total Bilirubin: 0.5 mg/dL (ref 0.0–1.2)
Total Protein: 5.5 g/dL — ABNORMAL LOW (ref 6.5–8.1)

## 2024-04-11 LAB — CBC
HCT: 21.8 % — ABNORMAL LOW (ref 39.0–52.0)
Hemoglobin: 6.8 g/dL — CL (ref 13.0–17.0)
MCH: 28.9 pg (ref 26.0–34.0)
MCHC: 31.2 g/dL (ref 30.0–36.0)
MCV: 92.8 fL (ref 80.0–100.0)
Platelets: 103 10*3/uL — ABNORMAL LOW (ref 150–400)
RBC: 2.35 MIL/uL — ABNORMAL LOW (ref 4.22–5.81)
RDW: 15.7 % — ABNORMAL HIGH (ref 11.5–15.5)
WBC: 8.5 10*3/uL (ref 4.0–10.5)
nRBC: 0 % (ref 0.0–0.2)

## 2024-04-11 LAB — HEMOGLOBIN AND HEMATOCRIT, BLOOD
HCT: 26.3 % — ABNORMAL LOW (ref 39.0–52.0)
Hemoglobin: 8.3 g/dL — ABNORMAL LOW (ref 13.0–17.0)

## 2024-04-11 LAB — GLUCOSE, CAPILLARY
Glucose-Capillary: 159 mg/dL — ABNORMAL HIGH (ref 70–99)
Glucose-Capillary: 163 mg/dL — ABNORMAL HIGH (ref 70–99)
Glucose-Capillary: 225 mg/dL — ABNORMAL HIGH (ref 70–99)
Glucose-Capillary: 204 mg/dL — ABNORMAL HIGH (ref 70–99)
Glucose-Capillary: 266 mg/dL — ABNORMAL HIGH (ref 70–99)
Glucose-Capillary: 277 mg/dL — ABNORMAL HIGH (ref 70–99)

## 2024-04-11 LAB — PROCALCITONIN: Procalcitonin: 23.66 ng/mL

## 2024-04-11 LAB — PREPARE RBC (CROSSMATCH)

## 2024-04-11 LAB — MAGNESIUM: Magnesium: 2.3 mg/dL (ref 1.7–2.4)

## 2024-04-11 LAB — PHOSPHORUS: Phosphorus: 4.2 mg/dL (ref 2.5–4.6)

## 2024-04-11 MED ORDER — PENTAFLUOROPROP-TETRAFLUOROETH EX AERO: 1.0000 | INHALATION_SPRAY | CUTANEOUS | Status: DC | PRN

## 2024-04-11 MED ORDER — ALTEPLASE 2 MG IJ SOLR: 2.0000 mg | Freq: Once | INTRAMUSCULAR | Status: DC | PRN

## 2024-04-11 MED ORDER — INSULIN GLARGINE-YFGN 100 UNIT/ML ~~LOC~~ SOLN
50.0000 [IU] | Freq: Every day | SUBCUTANEOUS | Status: DC
  Administered 2024-04-11: 50 [IU] via SUBCUTANEOUS
  Filled 2024-04-11: qty 0.5

## 2024-04-11 MED ORDER — SODIUM CHLORIDE 0.9% IV SOLUTION: Freq: Once | INTRAVENOUS | Status: DC

## 2024-04-11 MED ORDER — HEPARIN SODIUM (PORCINE) 1000 UNIT/ML DIALYSIS
2000.0000 [IU] | INTRAMUSCULAR | Status: AC | PRN
  Administered 2024-04-11: 2000 [IU] via INTRAVENOUS_CENTRAL

## 2024-04-11 MED ORDER — VANCOMYCIN HCL IN DEXTROSE 1-5 GM/200ML-% IV SOLN
1000.0000 mg | Freq: Once | INTRAVENOUS | Status: AC
  Administered 2024-04-11: 1000 mg via INTRAVENOUS
  Filled 2024-04-11: qty 200

## 2024-04-11 MED ORDER — HEPARIN SODIUM (PORCINE) 1000 UNIT/ML DIALYSIS: 1000.0000 [IU] | INTRAMUSCULAR | Status: DC | PRN

## 2024-04-11 MED ORDER — INSULIN ASPART 100 UNIT/ML IJ SOLN
0.0000 [IU] | INTRAMUSCULAR | Status: DC
  Administered 2024-04-11 (×2): 8 [IU] via SUBCUTANEOUS
  Administered 2024-04-12: 5 [IU] via SUBCUTANEOUS
  Administered 2024-04-12: 15 [IU] via SUBCUTANEOUS

## 2024-04-11 MED ORDER — INSULIN ASPART 100 UNIT/ML IJ SOLN
3.0000 [IU] | INTRAMUSCULAR | Status: DC
  Administered 2024-04-11 – 2024-04-12 (×5): 3 [IU] via SUBCUTANEOUS

## 2024-04-11 MED ORDER — LIDOCAINE-PRILOCAINE 2.5-2.5 % EX CREA: 1.0000 | TOPICAL_CREAM | CUTANEOUS | Status: DC | PRN

## 2024-04-11 MED ORDER — NEPRO/CARBSTEADY PO LIQD: 237.0000 mL | ORAL | Status: DC | PRN

## 2024-04-11 MED ORDER — ANTICOAGULANT SODIUM CITRATE 4% (200MG/5ML) IV SOLN: 5.0000 mL | Status: DC | PRN

## 2024-04-11 MED ORDER — HEPARIN SODIUM (PORCINE) 1000 UNIT/ML DIALYSIS: 2000.0000 [IU] | Freq: Once | INTRAMUSCULAR | Status: DC

## 2024-04-11 MED ORDER — LIDOCAINE HCL (PF) 1 % IJ SOLN: 5.0000 mL | INTRAMUSCULAR | Status: DC | PRN

## 2024-04-11 NOTE — Inpatient Diabetes Management (Signed)
 Inpatient Diabetes Program Recommendations  AACE/ADA: New Consensus Statement on Inpatient Glycemic Control (2015)  Target Ranges:  Prepandial:   less than 140 mg/dL      Peak postprandial:   less than 180 mg/dL (1-2 hours)      Critically ill patients:  140 - 180 mg/dL   Lab Results  Component Value Date   GLUCAP 225 (H) 04/11/2024   HGBA1C 8.5 (H) 03/22/2024    Review of Glycemic Control  Latest Reference Range & Units 04/10/24 18:27 04/10/24 19:36 04/10/24 21:37 04/10/24 23:19 04/11/24 01:27 04/11/24 03:09 04/11/24 07:41  Glucose-Capillary 70 - 99 mg/dL 401 (H) 027 (H) 253 (H) 160 (H) 159 (H) 163 (H) 225 (H)   Diabetes history: DM 2 Outpatient Diabetes medications:  U500 55 units bid, Humalog  0-9 units tid with meals Current orders for Inpatient glycemic control:  Semglee  50 units daily Novolog  3 units q 4 hours (tube feed coverage) Novolog  1-3 units q 4 hours Inpatient Diabetes Program Recommendations:   Referral received.  Patient was on U500 prior to admit.  Agree with increase in Semglee  and addition of tube feed coverage.  Will follow.   Thanks, Josefa Ni, RN, BC-ADM Inpatient Diabetes Coordinator Pager [phone]  (8a-5p)

## 2024-04-11 NOTE — Progress Notes (Signed)
 D/w pt daughter and (ex) spouse on 3 way call. Updates provided. Discussed plan for MRI at appropriate time, and concerns regarding his current neuro exam.  Discussed that there is a possibility that he may have a normal MRI, yet still have a brain injury. Encouraged them to start thinking about QOL goals.  All questions answered    Addl CCT 14 min    Eston Hence MSN, AGACNP-BC [facility] 04/11/2024, 2:33 PM

## 2024-04-11 NOTE — Progress Notes (Signed)
 NAME:  Jeffrey [name], MRN:  [id number], DOB:  1961/11/17, LOS: 3 ADMISSION DATE:  04/08/2024, CONSULTATION DATE: 04/09/2024 REFERRING MD: EDP, CHIEF COMPLAINT: cardiac arrest    History of Present Illness:  A 63 yo male poorly compliant with dialysis (leaves ama as well), presented to hospital for sessions of late; recent hospitalization late April for undetermined arrhythmia who called ems with resp distress found to be in pea and acls started with EMS services. Reportedly missed multiple sessions of dialysis last week. Per chart review pt was in PEA, undergoing ACLS for ~23 mins until ROSC and unfortunately deteriorated shortly after again in PEA ~10 mins with ROSC now on escalated pressors and inotropic support.    CXR revealed pulmonary edema. EDP was gracious to place CVC and arterial line for pt. Unable to reach family at this time. CCM was asked to admit pt. All history is obtained from chart review as pt is intubated and unresponsive.    Cth pending   Pt seen with R facial twitching, unresponsive on low dose dose propofol , no purposeful movement. On 20epi and 0.03 vasopressin . NE on hold. Pt was given TNK during code and has epistaxis and bleeding from line sites. Labs being sent now.   Pertinent  Medical History  ESRD on HD, poorly adherent OSA, intermittent compliance with cpap T2DM with hyperglycemia HTN Hyperlipidemia Chronic anemia 2/2 ESRD COPD Chronic hypoxic resp failure on 3L baseline  Significant Hospital Events: Including procedures, antibiotic start and stop dates in addition to other pertinent events   5/12: admitted to ICU post arrest  5/13: on HD, Propofol  for sedation, PRVC 26/490/+8/70%, ABG 7.29/56/75/93%. Positive fluid balance 1.3 since admission.  5/14: hypotension in am, off Levophed since noon time after IV Albumin , on insulin  drip, on NS 0.9% @ 75 cc/hr, PRVC 26/490/+8/50%. On Propofol  and Fentanyl , on TTM. ABG is pending. Negative 1.1 L with HD, now  positive 0.6 L fluid balance in the last few hrs. On TF 5/15 1 PRBC transition off insulin  gtt   Interim History / Subjective:  1 PRBC for hgb 6.8 Transitioning off insulin  gtt   Objective    Blood pressure (!) 124/51, pulse 75, temperature 98.4 F (36.9 C), resp. rate (!) 26, height 5\' 5"  (1.651 m), weight (!) 140.8 kg, SpO2 97%.    Vent Mode: PRVC FiO2 (%):  [50 %] 50 % Set Rate:  [26 bmp] 26 bmp Vt Set:  [490 mL] 490 mL PEEP:  [8 cmH20] 8 cmH20 Plateau Pressure:  [26 cmH20-32 cmH20] 32 cmH20   Intake/Output Summary (Last 24 hours) at 04/11/2024 1018 Last data filed at 04/11/2024 0900 Gross per 24 hour  Intake 1710.03 ml  Output 890 ml  Net 820.03 ml   Filed Weights   04/09/24 0728 04/09/24 1154 04/10/24 0326  Weight: (!) 142.7 kg (!) 139.8 kg (!) 140.8 kg    Examination: General: Chronically and critically ill M intubated  Neuro: off sedation. No response to pain. No resp effort on PSV. Pupils are 4mm and non-reactive. Absent corneal reflexes. Does not follow commands  HENT: ETT secure anicteric sclera.  Lungs: Mechanically ventilated. Coarse sounds but overall diminished  Cardiovascular: cap refill < 3 sec  Abdomen: obese soft round  Extremities: L AKA. No acute appearing joint deformities   Resolved problem list   Lactic acidosis  Shock  HyperK   Assessment and Plan   OOH cardiac arrests Acute encephalopathy, c/f anoxic encephalopathy  Seizure-like activity  -23 min and 10 min  PEA arrests P -avoid fevers -- completes 40hr of TTM protocol 5/15 AM  -minimize CNS depressing meds  -cont keppra  -MRI 5/16 AM (if able can go 5/15 after midnight)   AoC hypoxic and hypercarbic resp failure COPD Aspiration PNA  Acute pulm edema  OSA  P -VAP, pulm hygiene -PRN cxr, abg -cont vanc, cefepime , will send a trach asp (though has been on abx a few days bow) as well as a PCT   AoC anemia Thrombocytopenia -1 PRBC 5/15 for hgb 6.8 -- f/u h/h -follow CBC    ESRD P -HD per nephro   Elevated LFTs -PRN LFTs   HTN Diastolic HF  P - volume off via HD   DM2 w hyperglycemia -Transitioning off insulin  gtt 5/15 -semglee  50 units + SSI + novolog  3u q6 EN coverage   GERD -PPI  Best Practice (right click and "Reselect all SmartList Selections" daily)   Diet/type: TF DVT prophylaxis prophylactic heparin   Pressure ulcer(s): N/A GI prophylaxis: PPI Lines: Central line and Arterial Line Foley:  Yes, and it is still needed Code Status:  full code Last date of multidisciplinary goals of care discussion [ ]  D/w with family and palliative care team   Labs   CBC: Recent Labs  Lab 04/08/24 2140 04/08/24 2148 04/08/24 2227 04/09/24 0030 04/09/24 0326 04/10/24 1413 04/11/24 0300  WBC 14.0*  --   --  9.4  --   --  8.5  NEUTROABS 11.0*  --   --   --   --   --   --   HGB 8.5*   < > 8.5* 8.4* 8.2* 7.5* 6.8*  HCT 29.2*   < > 25.0* 27.9* 24.0* 22.0* 21.8*  MCV 99.0  --   --  94.9  --   --  92.8  PLT 127*  --   --  121*  124*  --   --  103*   < > = values in this interval not displayed.    Basic Metabolic Panel: Recent Labs  Lab 04/08/24 2140 04/08/24 2148 04/08/24 2150 04/08/24 2227 04/09/24 0030 04/09/24 0037 04/09/24 0326 04/09/24 1332 04/09/24 2318 04/10/24 0407 04/10/24 1043 04/10/24 1413 04/10/24 1830 04/11/24 0300  NA 136   < > 136   < >  --  134* 134*  --  137  --  135 134*  --  136  K 5.2*   < > 5.3*   < >  --  7.1* 6.3*  --  4.1  --  3.9 4.0  --  3.9  CL 100  --  102  --   --  98  --   --  98  --  97*  --   --  100  CO2 24  --   --   --   --  25  --   --  27  --  24  --   --  23  GLUCOSE 394*  --  400*  --   --  426*  --   --  217*  --  227*  --   --  167*  BUN 69*  --  76*  --   --  75*  --   --  58*  --  64*  --   --  79*  CREATININE 6.36*  --  5.90*  --  6.56* 6.47*  --   --  5.12*  --  5.37*  --   --  5.85*  CALCIUM  8.9  --   --   --   --  8.4*  --   --  8.3*  --  8.4*  --   --  8.0*  MG  --   --   --    <  >  --  2.1  --  1.8 1.8 1.9  --   --  2.4 2.3  PHOS  --   --   --   --   --   --   --  4.0 3.8 3.5  --   --  3.7 4.2   < > = values in this interval not displayed.   GFR: Estimated Creatinine Clearance: 17 mL/min (A) (by C-G formula based on SCr of 5.85 mg/dL (H)). Recent Labs  Lab 04/08/24 2140 04/08/24 2218 04/09/24 0030 04/11/24 0300  WBC 14.0*  --  9.4 8.5  LATICACIDVEN  --  3.4* 1.1  --     Liver Function Tests: Recent Labs  Lab 04/08/24 2140 04/10/24 0407 04/11/24 0300  AST 529* 172* 53*  ALT 637* 530* 283*  ALKPHOS 92 84 63  BILITOT 0.5 0.4 0.5  PROT 5.8* 6.2* 5.5*  ALBUMIN  2.8* 2.8* 2.7*   No results for input(s): "LIPASE", "AMYLASE" in the last 168 hours. No results for input(s): "AMMONIA" in the last 168 hours.  ABG    Component Value Date/Time   PHART 7.360 04/10/2024 1413   PCO2ART 46.7 04/10/2024 1413   PO2ART 73 (L) 04/10/2024 1413   HCO3 26.4 04/10/2024 1413   TCO2 28 04/10/2024 1413   ACIDBASEDEF 1.4 04/08/2024 2227   ACIDBASEDEF 1.0 04/08/2024 2227   O2SAT 94 04/10/2024 1413     Coagulation Profile: Recent Labs  Lab 04/09/24 0030  INR 1.5*    Cardiac Enzymes: No results for input(s): "CKTOTAL", "CKMB", "CKMBINDEX", "TROPONINI" in the last 168 hours.  HbA1C: Hgb A1c MFr Bld  Date/Time Value Ref Range Status  03/22/2024 04:38 AM 8.5 (H) 4.8 - 5.6 % Final    Comment:    (NOTE) Pre diabetes:          5.7%-6.4%  Diabetes:              >6.4%  Glycemic control for   <7.0% adults with diabetes   09/17/2023 02:27 AM 11.1 (H) 4.8 - 5.6 % Final    Comment:    (NOTE) Pre diabetes:          5.7%-6.4%  Diabetes:              >6.4%  Glycemic control for   <7.0% adults with diabetes     CBG: Recent Labs  Lab 04/10/24 2137 04/10/24 2319 04/11/24 0127 04/11/24 0309 04/11/24 0741  GLUCAP 131* 160* 159* 163* 225*   CRITICAL CARE Performed by: Delories Fetter   Total critical care time: 41 minutes  Critical care time was  exclusive of separately billable procedures and treating other patients. Critical care was necessary to treat or prevent imminent or life-threatening deterioration.  Critical care was time spent personally by me on the following activities: development of treatment plan with patient and/or surrogate as well as nursing, discussions with consultants, evaluation of patient's response to treatment, examination of patient, obtaining history from patient or surrogate, ordering and performing treatments and interventions, ordering and review of laboratory studies, ordering and review of radiographic studies, pulse oximetry and re-evaluation of patient's condition.  Eston Hence MSN, AGACNP-BC [place] [facility] Amion for pager 04/11/2024, 10:18 AM

## 2024-04-11 NOTE — Progress Notes (Signed)
 [facility] Progress Note  Subjective:  BP's went up and nicardipine was started  Vitals:   04/11/24 1215 04/11/24 1230 04/11/24 1245 04/11/24 1256  BP: (!) 103/46 (!) 102/45 (!) 103/45 (!) 102/45  Pulse:      Resp:      Temp:      TempSrc:      SpO2:      Weight:      Height:        Exam: Gen on vent, sedated, no responding at all Sclera anicteric, throat w/ ETT No jvd or bruits Chest clear anterior RRR no MRG Abd soft markedly obese no mass or ascites +bs MS L AKA  Ext diffuse chronic 1-2+ bilat LE edema Neuro is on vent, sedated   AVG+bruit       Renal-related home meds: Norvasc  10 daily PhosLo  2 AC 3 times daily Coreg  25 twice daily Others: Trelegy Ellipta, trazodone , Flomax , Zoloft , Lyrica , PPI, nortriptyline , insulin , Norco, Valium , Lipitor, aspirin      Last OP HD orders dec 2023 --> 3:15h  129kg  RUE AVG   Heparin  2000   patient was discharged from [facility] due to behavior issues about 16 months ago    CXR 5/12 - bilat perihilar vasc congestion    Assessment/ Plan: Cardiac arrest: 04/08/24, was down for about 23 min. Per CCM Vol overload: w/ vasc congestion/ early edema by 5/12 CXR. Had 3L UF on 5/13 yesterday. HD today.  ESRD: doesn't have an OP unit. HD TTS here for now. HD today.  BP: bp's normal today, off nicardipine gtt. Cont to lower vol w/ HD.  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  Hold binders until eating. Hyperkalemia: resolved.      Larry Poag MD  [facility] 04/11/2024, 1:00 PM  Recent Labs  Lab 04/10/24 0407 04/10/24 1043 04/10/24 1413 04/10/24 1830 04/11/24 0300  HGB  --   --  7.5*  --  6.8*  ALBUMIN  2.8*  --   --   --  2.7*  CALCIUM   --  8.4*  --   --  8.0*  PHOS 3.5  --   --  3.7 4.2  CREATININE  --  5.37*  --   --  5.85*  K  --  3.9 4.0  --  3.9   No results for input(s): "IRON ", "TIBC", "FERRITIN" in the last 168 hours. Inpatient medications:  sodium chloride     Intravenous Once   Chlorhexidine  Gluconate Cloth  6 each Topical Daily   Chlorhexidine  Gluconate Cloth  6 each Topical Q0600   docusate  100 mg Per Tube BID   feeding supplement (PROSource TF20)  60 mL Per Tube BID   [START ON 04/12/2024] heparin   2,000 Units Dialysis Once in dialysis   heparin  injection (subcutaneous)  5,000 Units Subcutaneous Q8H   insulin  aspart  1-3 Units Subcutaneous Q4H   insulin  aspart  3 Units Subcutaneous Q4H   insulin  glargine-yfgn  50 Units Subcutaneous Daily   levETIRAcetam  500 mg Intravenous Q12H   mupirocin  ointment   Nasal BID   mouth rinse  15 mL Mouth Rinse Q2H   pantoprazole  (PROTONIX ) IV  40 mg Intravenous Q24H   polyethylene glycol  17 g Per Tube Daily   sodium chloride  flush  3-10 mL Intravenous Q12H   vancomycin  variable dose per unstable renal function (pharmacist dosing)   Does not apply See admin instructions    anticoagulant sodium citrate   ceFEPime  (MAXIPIME ) IV Stopped (04/10/24 2146)   feeding supplement (OSMOLITE 1.5 CAL) 30 mL/hr at 04/11/24 1200   vancomycin  1,000 mg (04/11/24 1217)   acetaminophen  **OR** acetaminophen  (TYLENOL ) oral liquid 160 mg/5 mL **OR** acetaminophen , alteplase , anticoagulant sodium citrate , docusate, heparin , lidocaine  (PF), lidocaine -prilocaine , ondansetron  (ZOFRAN ) IV, mouth rinse, pentafluoroprop-tetrafluoroeth, polyethylene glycol, sodium chloride  flush

## 2024-04-11 NOTE — Progress Notes (Signed)
 CCM called to bedside after critical labs

## 2024-04-11 NOTE — Progress Notes (Signed)
 eLink Physician-Brief Progress Note Patient Name: BHARGAV GOYETTE DOB: 1961-05-30 MRN: [id number]   Date of Service  04/11/2024  HPI/Events of Note  Hemoglobin 6.8 gm / dl.  eICU Interventions  One unit of PRBC ordered transfused.        Yarenis Cerino U Halden Phegley 04/11/2024, 3:50 AM

## 2024-04-12 ENCOUNTER — Inpatient Hospital Stay (HOSPITAL_COMMUNITY)

## 2024-04-12 DIAGNOSIS — I469 Cardiac arrest, cause unspecified: Secondary | ICD-10-CM | POA: Diagnosis not present

## 2024-04-12 DIAGNOSIS — I10 Essential (primary) hypertension: Secondary | ICD-10-CM

## 2024-04-12 DIAGNOSIS — J9622 Acute and chronic respiratory failure with hypercapnia: Secondary | ICD-10-CM | POA: Diagnosis not present

## 2024-04-12 DIAGNOSIS — G9340 Encephalopathy, unspecified: Secondary | ICD-10-CM

## 2024-04-12 DIAGNOSIS — J9621 Acute and chronic respiratory failure with hypoxia: Secondary | ICD-10-CM | POA: Diagnosis not present

## 2024-04-12 DIAGNOSIS — J449 Chronic obstructive pulmonary disease, unspecified: Secondary | ICD-10-CM | POA: Diagnosis not present

## 2024-04-12 LAB — GLUCOSE, CAPILLARY
Glucose-Capillary: 248 mg/dL — ABNORMAL HIGH (ref 70–99)
Glucose-Capillary: 281 mg/dL — ABNORMAL HIGH (ref 70–99)
Glucose-Capillary: 310 mg/dL — ABNORMAL HIGH (ref 70–99)
Glucose-Capillary: 335 mg/dL — ABNORMAL HIGH (ref 70–99)
Glucose-Capillary: 342 mg/dL — ABNORMAL HIGH (ref 70–99)
Glucose-Capillary: 357 mg/dL — ABNORMAL HIGH (ref 70–99)
Glucose-Capillary: 366 mg/dL — ABNORMAL HIGH (ref 70–99)

## 2024-04-12 LAB — COMPREHENSIVE METABOLIC PANEL WITH GFR
ALT: 194 U/L — ABNORMAL HIGH (ref 0–44)
AST: 37 U/L (ref 15–41)
Albumin: 2.4 g/dL — ABNORMAL LOW (ref 3.5–5.0)
Alkaline Phosphatase: 84 U/L (ref 38–126)
Anion gap: 11 (ref 5–15)
BUN: 63 mg/dL — ABNORMAL HIGH (ref 8–23)
CO2: 26 mmol/L (ref 22–32)
Calcium: 7.9 mg/dL — ABNORMAL LOW (ref 8.9–10.3)
Chloride: 95 mmol/L — ABNORMAL LOW (ref 98–111)
Creatinine, Ser: 4.81 mg/dL — ABNORMAL HIGH (ref 0.61–1.24)
GFR, Estimated: 13 mL/min — ABNORMAL LOW (ref >60)
Glucose, Bld: 336 mg/dL — ABNORMAL HIGH (ref 70–99)
Potassium: 4.1 mmol/L (ref 3.5–5.1)
Sodium: 132 mmol/L — ABNORMAL LOW (ref 135–145)
Total Bilirubin: 0.7 mg/dL (ref 0.0–1.2)
Total Protein: 5.7 g/dL — ABNORMAL LOW (ref 6.5–8.1)

## 2024-04-12 LAB — BPAM RBC
Blood Product Expiration Date: 202505292359
ISSUE DATE / TIME: 202505150534
Unit Type and Rh: 600

## 2024-04-12 LAB — TYPE AND SCREEN
ABO/RH(D): A NEG
Antibody Screen: NEGATIVE
Unit division: 0

## 2024-04-12 LAB — CBC
HCT: 24.2 % — ABNORMAL LOW (ref 39.0–52.0)
Hemoglobin: 7.6 g/dL — ABNORMAL LOW (ref 13.0–17.0)
MCH: 29 pg (ref 26.0–34.0)
MCHC: 31.4 g/dL (ref 30.0–36.0)
MCV: 92.4 fL (ref 80.0–100.0)
Platelets: 103 10*3/uL — ABNORMAL LOW (ref 150–400)
RBC: 2.62 MIL/uL — ABNORMAL LOW (ref 4.22–5.81)
RDW: 15.8 % — ABNORMAL HIGH (ref 11.5–15.5)
WBC: 8 10*3/uL (ref 4.0–10.5)
nRBC: 0 % (ref 0.0–0.2)

## 2024-04-12 LAB — PHOSPHORUS: Phosphorus: 3.8 mg/dL (ref 2.5–4.6)

## 2024-04-12 LAB — MAGNESIUM: Magnesium: 2.1 mg/dL (ref 1.7–2.4)

## 2024-04-12 MED ORDER — VANCOMYCIN HCL IN DEXTROSE 1-5 GM/200ML-% IV SOLN: 1000.0000 mg | Freq: Once | INTRAVENOUS | Status: DC

## 2024-04-12 MED ORDER — LEVETIRACETAM 500 MG PO TABS
500.0000 mg | ORAL_TABLET | Freq: Two times a day (BID) | ORAL | Status: DC
  Administered 2024-04-13 – 2024-04-15 (×6): 500 mg
  Filled 2024-04-12 (×6): qty 1

## 2024-04-12 MED ORDER — CHLORHEXIDINE GLUCONATE CLOTH 2 % EX PADS
6.0000 | MEDICATED_PAD | Freq: Every day | CUTANEOUS | Status: DC
  Administered 2024-04-13 – 2024-04-15 (×3): 6 via TOPICAL

## 2024-04-12 MED ORDER — INSULIN ASPART 100 UNIT/ML IJ SOLN
12.0000 [IU] | INTRAMUSCULAR | Status: DC
  Administered 2024-04-12 – 2024-04-13 (×4): 12 [IU] via SUBCUTANEOUS

## 2024-04-12 MED ORDER — INSULIN GLARGINE-YFGN 100 UNIT/ML ~~LOC~~ SOLN
40.0000 [IU] | Freq: Two times a day (BID) | SUBCUTANEOUS | Status: DC
  Administered 2024-04-12 – 2024-04-13 (×3): 40 [IU] via SUBCUTANEOUS
  Filled 2024-04-12 (×5): qty 0.4

## 2024-04-12 MED ORDER — MIDODRINE HCL 5 MG PO TABS
10.0000 mg | ORAL_TABLET | Freq: Three times a day (TID) | ORAL | Status: DC
  Administered 2024-04-12 – 2024-04-15 (×10): 10 mg
  Filled 2024-04-12 (×10): qty 2

## 2024-04-12 MED ORDER — INSULIN ASPART 100 UNIT/ML IJ SOLN
0.0000 [IU] | INTRAMUSCULAR | Status: DC
  Administered 2024-04-12: 15 [IU] via SUBCUTANEOUS
  Administered 2024-04-12: 11 [IU] via SUBCUTANEOUS
  Administered 2024-04-12: 15 [IU] via SUBCUTANEOUS
  Administered 2024-04-12: 20 [IU] via SUBCUTANEOUS
  Administered 2024-04-13 (×3): 15 [IU] via SUBCUTANEOUS
  Administered 2024-04-13: 11 [IU] via SUBCUTANEOUS
  Administered 2024-04-13: 7 [IU] via SUBCUTANEOUS
  Administered 2024-04-13: 15 [IU] via SUBCUTANEOUS
  Administered 2024-04-14: 11 [IU] via SUBCUTANEOUS
  Administered 2024-04-14: 4 [IU] via SUBCUTANEOUS
  Administered 2024-04-14: 7 [IU] via SUBCUTANEOUS
  Administered 2024-04-14 (×3): 4 [IU] via SUBCUTANEOUS
  Administered 2024-04-14: 7 [IU] via SUBCUTANEOUS
  Administered 2024-04-15 (×5): 11 [IU] via SUBCUTANEOUS
  Administered 2024-04-16: 15 [IU] via SUBCUTANEOUS

## 2024-04-12 MED ORDER — INSULIN ASPART 100 UNIT/ML IJ SOLN
8.0000 [IU] | INTRAMUSCULAR | Status: DC
Start: 2024-04-12 — End: 2024-04-12
  Administered 2024-04-12 (×2): 8 [IU] via SUBCUTANEOUS

## 2024-04-12 MED ORDER — LEVETIRACETAM 500 MG PO TABS
500.0000 mg | ORAL_TABLET | Freq: Two times a day (BID) | ORAL | Status: AC
  Administered 2024-04-12: 500 mg
  Filled 2024-04-12: qty 1

## 2024-04-12 MED ORDER — INSULIN GLARGINE-YFGN 100 UNIT/ML ~~LOC~~ SOLN
20.0000 [IU] | Freq: Two times a day (BID) | SUBCUTANEOUS | Status: DC
  Filled 2024-04-12 (×2): qty 0.2

## 2024-04-12 NOTE — Progress Notes (Signed)
 NAME:  Jeffrey [name], MRN:  [id number], DOB:  May 13, 1961, LOS: 4 ADMISSION DATE:  04/08/2024, CONSULTATION DATE: 04/09/2024 REFERRING MD: EDP, CHIEF COMPLAINT: cardiac arrest    History of Present Illness:  A 63 yo male poorly compliant with dialysis (leaves ama as well), presented to hospital for sessions of late; recent hospitalization late April for undetermined arrhythmia who called ems with resp distress found to be in pea and acls started with EMS services. Reportedly missed multiple sessions of dialysis last week. Per chart review pt was in PEA, undergoing ACLS for ~23 mins until ROSC and unfortunately deteriorated shortly after again in PEA ~10 mins with ROSC now on escalated pressors and inotropic support.    CXR revealed pulmonary edema. EDP was gracious to place CVC and arterial line for pt. Unable to reach family at this time. CCM was asked to admit pt. All history is obtained from chart review as pt is intubated and unresponsive.    Cth pending   Pt seen with R facial twitching, unresponsive on low dose dose propofol , no purposeful movement. On 20epi and 0.03 vasopressin . NE on hold. Pt was given TNK during code and has epistaxis and bleeding from line sites. Labs being sent now.   Pertinent  Medical History  ESRD on HD, poorly adherent OSA, intermittent compliance with cpap T2DM with hyperglycemia HTN Hyperlipidemia Chronic anemia 2/2 ESRD COPD Chronic hypoxic resp failure on 3L baseline  Significant Hospital Events: Including procedures, antibiotic start and stop dates in addition to other pertinent events   5/12: admitted to ICU post arrest  5/13: on HD, Propofol  for sedation, PRVC 26/490/+8/70%, ABG 7.29/56/75/93%. Positive fluid balance 1.3 since admission.  5/14: hypotension in am, off Levophed since noon time after IV Albumin , on insulin  drip, on NS 0.9% @ 75 cc/hr, PRVC 26/490/+8/50%. On Propofol  and Fentanyl , on TTM. ABG is pending. Negative 1.1 L with HD, now  positive 0.6 L fluid balance in the last few hrs. On TF 5/15 1 PRBC transition off insulin  gtt  5/16 for MRI brain.   Interim History / Subjective:  CBGs have been high  overnight -- mid 200s to mid 300s -- but no changes made   Hgb 7.6 plt 103  Objective    Blood pressure (!) 116/48, pulse 65, temperature (!) 96.2 F (35.7 C), temperature source Axillary, resp. rate (!) 26, height 5\' 5"  (1.651 m), weight (!) 136.8 kg, SpO2 100%.    Vent Mode: PRVC FiO2 (%):  [40 %-50 %] 40 % Set Rate:  [26 bmp] 26 bmp Vt Set:  [490 mL] 490 mL PEEP:  [8 cmH20] 8 cmH20 Plateau Pressure:  [21 cmH20-28 cmH20] 27 cmH20   Intake/Output Summary (Last 24 hours) at 04/12/2024 1013 Last data filed at 04/12/2024 0800 Gross per 24 hour  Intake 999.66 ml  Output 3242 ml  Net -2242.34 ml   Filed Weights   04/09/24 1154 04/10/24 0326 04/11/24 1050  Weight: (!) 139.8 kg (!) 140.8 kg (!) 136.8 kg    Examination: General: Chronically and critically ill M intubated  Neuro: Pupils are non-reactive, about 3mm. No corneal reflexes, no cough/gag. No dolls eye. Is initiating spontaneous respirations.  HENT: ETT secure anicteric sclera pink mmm protuberant tongue  Lungs: Diminished lung sounds. Mechanically ventilated. On PSV, very low Vt.  Cardiovascular: cap refill brisk. Distant heart tones  Abdomen: Obese, soft.  Extremities: L AKA. LUE fistula. No obvious appearing joint deformity.R fem cvc r fem art line    Resolved problem  list   Lactic acidosis  Shock  HyperK   Assessment and Plan    OOH cardiac arrests Acute encephalopathy, c/f anoxic encephalopathy Sz like activity  -23 min and 10 min PEA arrests P -MRI has been ordered for 5/16 AM -avoid fevers -- has completed 40hr ttm  -off cont sedation -cont keppra  -as of 5/16, does have spontaneous respirations; d/w RN that he is not a candidate for apnea testing.   AoC hypoxic and hypercarbic resp failure COPD Aspiration PNA Acute pulm  edema OSA  P -VAP, pulm hygiene -PRN cxr, abg -cont vanc, cefepime , follow trach asp  -cont efforts at vent weaning. Does have some spontaneous drive 0/10 but inadequate mechanics to continue PSV trial. Mentation ultimately would preclude extubation.   AoC anemia Thrombocytopenia, stable  -1 PRBC 5/15 for hgb 6.8  P -follow CBC   ESRD HypoNa, mild  P -HD per nephro   Elevated LFTs  -PRN LFTs   HTN dHF  P - volume off via HD   DM2 w hyperglycemia  -Transitioning off insulin  gtt 5/15 -r SSI + 8 units novolog  q4hr + semglee  40 BID   GERD  -PPI  RLE pressure wound  -WOC  Inadequate PO intake -EN per RDN   GOC -plan to cont discussions w family as his neuro prognosis becomes more clear. Did open these discussions 5/15 and expressed concerns re his neuro exam and encouraged family to start thinking about acceptable QOL goals while we await more information re neuro prognosis.   Best Practice (right click and "Reselect all SmartList Selections" daily)   Diet/type: EN  DVT prophylaxis prophylactic heparin   Pressure ulcer(s): identified on: 14/May/2025 GI prophylaxis: PPI Lines: Central line and Arterial Line Foley:  Yes, and it is still needed Code Status:  full code Last date of multidisciplinary goals of care discussion [ ]  D/w with family 5/15  Labs   CBC: Recent Labs  Lab 04/08/24 2140 04/08/24 2148 04/09/24 0030 04/09/24 0326 04/10/24 1413 04/11/24 0300 04/11/24 1258 04/12/24 0300  WBC 14.0*  --  9.4  --   --  8.5  --  8.0  NEUTROABS 11.0*  --   --   --   --   --   --   --   HGB 8.5*   < > 8.4* 8.2* 7.5* 6.8* 8.3* 7.6*  HCT 29.2*   < > 27.9* 24.0* 22.0* 21.8* 26.3* 24.2*  MCV 99.0  --  94.9  --   --  92.8  --  92.4  PLT 127*  --  121*  124*  --   --  103*  --  103*   < > = values in this interval not displayed.    Basic Metabolic Panel: Recent Labs  Lab 04/09/24 0037 04/09/24 0326 04/09/24 2318 04/10/24 0407 04/10/24 1043 04/10/24 1413  04/10/24 1830 04/11/24 0300 04/12/24 0300  NA 134*   < > 137  --  135 134*  --  136 132*  K 7.1*   < > 4.1  --  3.9 4.0  --  3.9 4.1  CL 98  --  98  --  97*  --   --  100 95*  CO2 25  --  27  --  24  --   --  23 26  GLUCOSE 426*  --  217*  --  227*  --   --  167* 336*  BUN 75*  --  58*  --  64*  --   --  79* 63*  CREATININE 6.47*  --  5.12*  --  5.37*  --   --  5.85* 4.81*  CALCIUM  8.4*  --  8.3*  --  8.4*  --   --  8.0* 7.9*  MG 2.1   < > 1.8 1.9  --   --  2.4 2.3 2.1  PHOS  --    < > 3.8 3.5  --   --  3.7 4.2 3.8   < > = values in this interval not displayed.   GFR: Estimated Creatinine Clearance: 20.4 mL/min (A) (by C-G formula based on SCr of 4.81 mg/dL (H)). Recent Labs  Lab 04/08/24 2140 04/08/24 2218 04/09/24 0030 04/11/24 0300 04/12/24 0300  PROCALCITON  --   --   --  23.66  --   WBC 14.0*  --  9.4 8.5 8.0  LATICACIDVEN  --  3.4* 1.1  --   --     Liver Function Tests: Recent Labs  Lab 04/08/24 2140 04/10/24 0407 04/11/24 0300 04/12/24 0300  AST 529* 172* 53* 37  ALT 637* 530* 283* 194*  ALKPHOS 92 84 63 84  BILITOT 0.5 0.4 0.5 0.7  PROT 5.8* 6.2* 5.5* 5.7*  ALBUMIN  2.8* 2.8* 2.7* 2.4*   No results for input(s): "LIPASE", "AMYLASE" in the last 168 hours. No results for input(s): "AMMONIA" in the last 168 hours.  ABG    Component Value Date/Time   PHART 7.360 04/10/2024 1413   PCO2ART 46.7 04/10/2024 1413   PO2ART 73 (L) 04/10/2024 1413   HCO3 26.4 04/10/2024 1413   TCO2 28 04/10/2024 1413   ACIDBASEDEF 1.4 04/08/2024 2227   ACIDBASEDEF 1.0 04/08/2024 2227   O2SAT 94 04/10/2024 1413     Coagulation Profile: Recent Labs  Lab 04/09/24 0030  INR 1.5*    Cardiac Enzymes: No results for input(s): "CKTOTAL", "CKMB", "CKMBINDEX", "TROPONINI" in the last 168 hours.  HbA1C: Hgb A1c MFr Bld  Date/Time Value Ref Range Status  03/22/2024 04:38 AM 8.5 (H) 4.8 - 5.6 % Final    Comment:    (NOTE) Pre diabetes:          5.7%-6.4%  Diabetes:               >6.4%  Glycemic control for   <7.0% adults with diabetes   09/17/2023 02:27 AM 11.1 (H) 4.8 - 5.6 % Final    Comment:    (NOTE) Pre diabetes:          5.7%-6.4%  Diabetes:              >6.4%  Glycemic control for   <7.0% adults with diabetes     CBG: Recent Labs  Lab 04/11/24 1531 04/11/24 1947 04/11/24 2358 04/12/24 0343 04/12/24 0821  GLUCAP 266* 277* 248* 357* 342*   CRITICAL CARE Performed by: Delories Fetter   Total critical care time: 43 minutes  Critical care time was exclusive of separately billable procedures and treating other patients.  Critical care was necessary to treat or prevent imminent or life-threatening deterioration.  Critical care was time spent personally by me on the following activities: development of treatment plan with patient and/or surrogate as well as nursing, discussions with consultants, evaluation of patient's response to treatment, examination of patient, obtaining history from patient or surrogate, ordering and performing treatments and interventions, ordering and review of laboratory studies, ordering and review of radiographic studies, pulse oximetry and re-evaluation of patient's condition.  Eston Hence MSN, AGACNP-BC [place] [facility]/Critical [facility]  Medicine Amion for pager  04/12/2024, 10:13 AM

## 2024-04-12 NOTE — Progress Notes (Signed)
 Pt was transported to MRI and back via ventilator. ETT hub being disconnected during MRI, RT replaced ETT hub. Pt sats mid 80's and is not tolerating FiO2 of 40%, Pt is now on 100% and will titrate FiO2 down accordingly.

## 2024-04-12 NOTE — Plan of Care (Signed)
 Daughter had to step out, likely back in a few hours. FMLA paperwork in pt chart    Eston Hence MSN, AGACNP-BC [facility] Pulmonary/Critical Care Medicine 04/12/2024, 1:17 PM

## 2024-04-12 NOTE — Progress Notes (Signed)
 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 Discussed that unfortunately this portends a very poor prognosis for neurologic recovery.  I again encouraged them to discuss QOL goals as I think this will be most helpful in determining next steps re direction of care.    Add'l CCT 19 min     Eston Hence MSN, AGACNP-BC [facility] Pulmonary/Critical Care Medicine 04/12/2024, 6:26 PM

## 2024-04-12 NOTE — Plan of Care (Signed)
  Problem: Education: Goal: Ability to describe self-care measures that may prevent or decrease complications (Diabetes Survival Skills Education) will improve Outcome: Not Progressing Goal: Individualized Educational Video(s) Outcome: Not Progressing   Problem: Coping: Goal: Ability to adjust to condition or change in health will improve Outcome: Not Progressing   Problem: Fluid Volume: Goal: Ability to maintain a balanced intake and output will improve Outcome: Not Progressing   Problem: Health Behavior/Discharge Planning: Goal: Ability to identify and utilize available resources and services will improve Outcome: Not Progressing Goal: Ability to manage health-related needs will improve Outcome: Not Progressing   Problem: Metabolic: Goal: Ability to maintain appropriate glucose levels will improve Outcome: Not Progressing   Problem: Nutritional: Goal: Maintenance of adequate nutrition will improve Outcome: Not Progressing Goal: Progress toward achieving an optimal weight will improve Outcome: Not Progressing   Problem: Skin Integrity: Goal: Risk for impaired skin integrity will decrease Outcome: Not Progressing   Problem: Tissue Perfusion: Goal: Adequacy of tissue perfusion will improve Outcome: Not Progressing   Problem: Education: Goal: Knowledge of General Education information will improve Description: Including pain rating scale, medication(s)/side effects and non-pharmacologic comfort measures Outcome: Not Progressing   Problem: Health Behavior/Discharge Planning: Goal: Ability to manage health-related needs will improve Outcome: Not Progressing   Problem: Clinical Measurements: Goal: Ability to maintain clinical measurements within normal limits will improve Outcome: Not Progressing Goal: Will remain free from infection Outcome: Not Progressing Goal: Diagnostic test results will improve Outcome: Not Progressing Goal: Respiratory complications will  improve Outcome: Not Progressing Goal: Cardiovascular complication will be avoided Outcome: Not Progressing   Problem: Activity: Goal: Risk for activity intolerance will decrease Outcome: Not Progressing   Problem: Nutrition: Goal: Adequate nutrition will be maintained Outcome: Not Progressing   Problem: Coping: Goal: Level of anxiety will decrease Outcome: Not Progressing   Problem: Elimination: Goal: Will not experience complications related to bowel motility Outcome: Not Progressing Goal: Will not experience complications related to urinary retention Outcome: Not Progressing   Problem: Pain Managment: Goal: General experience of comfort will improve and/or be controlled Outcome: Not Progressing   Problem: Safety: Goal: Ability to remain free from injury will improve Outcome: Not Progressing   Problem: Skin Integrity: Goal: Risk for impaired skin integrity will decrease Outcome: Not Progressing   Problem: Education: Goal: Ability to manage disease process will improve Outcome: Not Progressing   Problem: Cardiac: Goal: Ability to achieve and maintain adequate cardiopulmonary perfusion will improve Outcome: Not Progressing   Problem: Neurologic: Goal: Promote progressive neurologic recovery Outcome: Not Progressing   Problem: Skin Integrity: Goal: Risk for impaired skin integrity will be minimized. Outcome: Not Progressing   Problem: Activity: Goal: Ability to tolerate increased activity will improve Outcome: Not Progressing   Problem: Respiratory: Goal: Ability to maintain a clear airway and adequate ventilation will improve Outcome: Not Progressing   Problem: Role Relationship: Goal: Method of communication will improve Outcome: Not Progressing

## 2024-04-12 NOTE — TOC Progression Note (Signed)
 Transition of Care [facility]) - Progression Note    Patient Details  Name: Jeffrey [name] MRN: [id number] Date of Birth: 1961-10-02  Transition of [facility]) CM/SW Contact  Tom-Johnson, Angelique Ken, RN Phone Number: 04/12/2024, 12:35 PM  Clinical Narrative:     Patient continues to be intubated and sedated. Plan for MRI today to evaluate for Anoxic Encephalopathy. WOC consulted for Rt Heel Pressure Injury. Nephrology following for in-hospital Dialysis. Continues on IV abx.  Patient not Medically ready for discharge.  CM will continue to follow as patient progresses with care towards discharge.            Expected Discharge Plan and Services                                               Social Determinants of Health (SDOH) Interventions SDOH Screenings   Food Insecurity: No Food Insecurity (04/03/2024)   Received from [facility]  Housing: Low Risk  (04/03/2024)   Received from [facility]  Recent Concern: Housing - High Risk (01/08/2024)   Received from [facility]  Transportation Needs: Unmet Transportation Needs (04/03/2024)   Received from [facility]  Utilities: Not At Risk (04/03/2024)   Received from [facility]  Financial Resource Strain: Low Risk  (04/03/2024)   Received from [facility]  Physical Activity: Inactive (09/08/2021)   Received from [facility], [facility]  Social Connections: Moderately Isolated (09/07/2022)   Received from [facility], [facility]  Stress: Stress Concern Present (09/07/2022)   Received from [facility]  Tobacco Use: Medium Risk (04/02/2024)    Readmission Risk Interventions    04/09/2024    2:13 PM 06/02/2023    4:31 PM 04/24/2023    4:36 PM  Readmission Risk Prevention Plan  Transportation  Screening Complete Complete Complete  Medication Review (RN Care Manager) Complete Complete Referral to Pharmacy  PCP or Specialist appointment within 3-5 days of discharge Complete Complete Complete  HRI or Home Care Consult Complete Complete Complete  SW Recovery Care/Counseling Consult Complete Complete Complete  Palliative Care Screening Not Applicable Not Applicable Not Applicable  Skilled Nursing Facility Not Applicable Not Applicable Not Applicable

## 2024-04-12 NOTE — Plan of Care (Signed)
 Palliative:  Initial consult 5/13: family declined goals of care discussions.  Chart reviewed. Reached out to [place], NP with CCM - no palliative needs currently.  Awaiting MRI results.   Please reach out to PMT if we can assist.   Alvino Aye, DNP, [facility] Palliative Medicine Team Team Phone # [phone]  Pager # [phone]

## 2024-04-12 NOTE — Progress Notes (Signed)
   04/12/24 0806  Daily Weaning Assessment  Daily Assessment of Readiness to Wean Wean protocol criteria met (SBT performed)  SBT Method CPAP 5 cm H20 and PS 5 cm H20 (PS 15)  Weaning Start Time 0806  Patient response Failed SBT terminated  Reason SBT Terminated  (MVE-4.6)   Pt was placed on PS/CPAP and did not tolerate. Even with PS of 15 Pt's Mve < 4.6. Pt was placed back due to low Minute ventilation.

## 2024-04-12 NOTE — Consult Note (Addendum)
 WOC Nurse Consult Note: Reason for Consult: Consult requested for right heel. Performed remotely after review of progress notes.  Wound type: Right heel with dark red purple Deep tissue pressure injury which was noted on the bedside nurses' wound care sheet and was present on admission. These are high risk to evolve into full thickness tissue loss within 7-10 days.  Pt is critically ill and has multiple systemic factors which can impair healing.  Pressure Injury POA: Yes Dressing procedure/placement/frequency: Prevalon boot to reduce pressure. Topical treatment orders provided for bedside nurses to perform as follows: Apply Xeroform gauze to right heel Q day and cover with foam dressing. Change foam dressing Q 3 days or PRN soiling. Please re-consult if further assistance is needed.  Thank-you,  Wiliam Harder MSN, RN, CWOCN, [place], CNS [phone]

## 2024-04-12 NOTE — Progress Notes (Signed)
 [facility] Progress Note  Subjective:  Yest   Vitals:   04/12/24 0500 04/12/24 0600 04/12/24 0800 04/12/24 0825  BP:      Pulse:      Resp: (!) 26 (!) 26 (!) 26   Temp:    (!) 96.2 F (35.7 C)  TempSrc:    Axillary  SpO2:      Weight:      Height:        Exam: Gen on vent, sedated, no responding at all Sclera anicteric, throat w/ ETT No jvd or bruits Chest clear anterior RRR no MRG Abd soft markedly obese no mass or ascites +bs MS L AKA  Ext diffuse chronic 1-2+ bilat LE edema Neuro is on vent, sedated   AVG+bruit       Renal-related home meds: Norvasc  10 daily PhosLo  2 AC 3 times daily Coreg  25 twice daily Others: Trelegy Ellipta, trazodone , Flomax , Zoloft , Lyrica , PPI, nortriptyline , insulin , Norco, Valium , Lipitor, aspirin      Last OP HD orders dec 2023 --> 3:15h  129kg  RUE AVG   Heparin  2000   patient was discharged from [facility] due to behavior issues about 16 months ago    CXR 5/12 - bilat perihilar vasc congestion    Assessment/ Plan: Cardiac arrest: 04/08/24, was down for about 23 min. Per CCM Vol overload: w/ vasc congestion/ early edema by 5/12 CXR. Had 3L UF 5/13 and 3L yesterday. Cont to lower vol as tolerated.  ESRD: doesn't have an OP unit. HD TTS here for now. HD tomorrow.  BP: bp's normal today, s/p nicardipine gtt. A-line bp's are 140/55 range.  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  Hold binders until eating. Hyperkalemia: resolved.      Larry Poag MD  [facility] 04/12/2024, 12:02 PM  Recent Labs  Lab 04/11/24 0300 04/11/24 1258 04/12/24 0300  HGB 6.8* 8.3* 7.6*  ALBUMIN  2.7*  --  2.4*  CALCIUM  8.0*  --  7.9*  PHOS 4.2  --  3.8  CREATININE 5.85*  --  4.81*  K 3.9  --  4.1   No results for input(s): "IRON ", "TIBC", "FERRITIN" in the last 168 hours. Inpatient medications:  sodium chloride    Intravenous Once   Chlorhexidine  Gluconate Cloth  6 each Topical Daily    Chlorhexidine  Gluconate Cloth  6 each Topical Q0600   docusate  100 mg Per Tube BID   feeding supplement (PROSource TF20)  60 mL Per Tube BID   heparin   2,000 Units Dialysis Once in dialysis   heparin  injection (subcutaneous)  5,000 Units Subcutaneous Q8H   insulin  aspart  0-20 Units Subcutaneous Q4H   insulin  aspart  8 Units Subcutaneous Q4H   insulin  glargine-yfgn  40 Units Subcutaneous BID   [START ON 04/13/2024] levETIRAcetam  500 mg Per Tube BID   levETIRAcetam  500 mg Per Tube BID   mupirocin  ointment   Nasal BID   mouth rinse  15 mL Mouth Rinse Q2H   pantoprazole  (PROTONIX ) IV  40 mg Intravenous Q24H   polyethylene glycol  17 g Per Tube Daily   sodium chloride  flush  3-10 mL Intravenous Q12H   vancomycin  variable dose per unstable renal function (pharmacist dosing)   Does not apply See admin instructions    anticoagulant sodium citrate      ceFEPime  (MAXIPIME ) IV Stopped (04/11/24 2145)   feeding supplement (OSMOLITE 1.5 CAL) 1,000 mL (04/12/24 0955)   acetaminophen  **OR** acetaminophen  (TYLENOL )  oral liquid 160 mg/5 mL **OR** acetaminophen , alteplase , anticoagulant sodium citrate , docusate, heparin , lidocaine  (PF), lidocaine -prilocaine , ondansetron  (ZOFRAN ) IV, mouth rinse, pentafluoroprop-tetrafluoroeth, polyethylene glycol, sodium chloride  flush

## 2024-04-13 DIAGNOSIS — G931 Anoxic brain damage, not elsewhere classified: Secondary | ICD-10-CM

## 2024-04-13 DIAGNOSIS — J9602 Acute respiratory failure with hypercapnia: Secondary | ICD-10-CM | POA: Diagnosis not present

## 2024-04-13 DIAGNOSIS — I469 Cardiac arrest, cause unspecified: Secondary | ICD-10-CM | POA: Diagnosis not present

## 2024-04-13 DIAGNOSIS — Z515 Encounter for palliative care: Secondary | ICD-10-CM | POA: Diagnosis not present

## 2024-04-13 DIAGNOSIS — J9622 Acute and chronic respiratory failure with hypercapnia: Secondary | ICD-10-CM | POA: Diagnosis not present

## 2024-04-13 DIAGNOSIS — J151 Pneumonia due to Pseudomonas: Secondary | ICD-10-CM

## 2024-04-13 DIAGNOSIS — J9621 Acute and chronic respiratory failure with hypoxia: Secondary | ICD-10-CM | POA: Diagnosis not present

## 2024-04-13 LAB — COMPREHENSIVE METABOLIC PANEL WITH GFR
ALT: 140 U/L — ABNORMAL HIGH (ref 0–44)
AST: 24 U/L (ref 15–41)
Albumin: 2.3 g/dL — ABNORMAL LOW (ref 3.5–5.0)
Alkaline Phosphatase: 92 U/L (ref 38–126)
Anion gap: 14 (ref 5–15)
BUN: 87 mg/dL — ABNORMAL HIGH (ref 8–23)
CO2: 24 mmol/L (ref 22–32)
Calcium: 8.4 mg/dL — ABNORMAL LOW (ref 8.9–10.3)
Chloride: 93 mmol/L — ABNORMAL LOW (ref 98–111)
Creatinine, Ser: 5.56 mg/dL — ABNORMAL HIGH (ref 0.61–1.24)
GFR, Estimated: 11 mL/min — ABNORMAL LOW (ref >60)
Glucose, Bld: 321 mg/dL — ABNORMAL HIGH (ref 70–99)
Potassium: 3.8 mmol/L (ref 3.5–5.1)
Sodium: 131 mmol/L — ABNORMAL LOW (ref 135–145)
Total Bilirubin: 0.5 mg/dL (ref 0.0–1.2)
Total Protein: 5.6 g/dL — ABNORMAL LOW (ref 6.5–8.1)

## 2024-04-13 LAB — GLUCOSE, CAPILLARY
Glucose-Capillary: 196 mg/dL — ABNORMAL HIGH (ref 70–99)
Glucose-Capillary: 205 mg/dL — ABNORMAL HIGH (ref 70–99)
Glucose-Capillary: 287 mg/dL — ABNORMAL HIGH (ref 70–99)
Glucose-Capillary: 334 mg/dL — ABNORMAL HIGH (ref 70–99)
Glucose-Capillary: 339 mg/dL — ABNORMAL HIGH (ref 70–99)
Glucose-Capillary: 350 mg/dL — ABNORMAL HIGH (ref 70–99)

## 2024-04-13 LAB — CBC
HCT: 24.6 % — ABNORMAL LOW (ref 39.0–52.0)
Hemoglobin: 7.6 g/dL — ABNORMAL LOW (ref 13.0–17.0)
MCH: 28.8 pg (ref 26.0–34.0)
MCHC: 30.9 g/dL (ref 30.0–36.0)
MCV: 93.2 fL (ref 80.0–100.0)
Platelets: 90 10*3/uL — ABNORMAL LOW (ref 150–400)
RBC: 2.64 MIL/uL — ABNORMAL LOW (ref 4.22–5.81)
RDW: 15.8 % — ABNORMAL HIGH (ref 11.5–15.5)
WBC: 6.5 10*3/uL (ref 4.0–10.5)
nRBC: 0 % (ref 0.0–0.2)

## 2024-04-13 LAB — CULTURE, RESPIRATORY W GRAM STAIN

## 2024-04-13 LAB — PHOSPHORUS: Phosphorus: 5.8 mg/dL — ABNORMAL HIGH (ref 2.5–4.6)

## 2024-04-13 LAB — TRIGLYCERIDES: Triglycerides: 128 mg/dL (ref <150)

## 2024-04-13 LAB — MAGNESIUM: Magnesium: 2.4 mg/dL (ref 1.7–2.4)

## 2024-04-13 MED ORDER — PENTAFLUOROPROP-TETRAFLUOROETH EX AERO: 1.0000 | INHALATION_SPRAY | CUTANEOUS | Status: DC | PRN

## 2024-04-13 MED ORDER — LIDOCAINE-PRILOCAINE 2.5-2.5 % EX CREA: 1.0000 | TOPICAL_CREAM | CUTANEOUS | Status: DC | PRN

## 2024-04-13 MED ORDER — HEPARIN SODIUM (PORCINE) 1000 UNIT/ML DIALYSIS: 2000.0000 [IU] | INTRAMUSCULAR | Status: DC | PRN

## 2024-04-13 MED ORDER — NEPRO/CARBSTEADY PO LIQD: 237.0000 mL | ORAL | Status: DC | PRN

## 2024-04-13 MED ORDER — ANTICOAGULANT SODIUM CITRATE 4% (200MG/5ML) IV SOLN: 5.0000 mL | Status: DC | PRN

## 2024-04-13 MED ORDER — HEPARIN SODIUM (PORCINE) 1000 UNIT/ML DIALYSIS: 2000.0000 [IU] | Freq: Once | INTRAMUSCULAR | Status: DC

## 2024-04-13 MED ORDER — NOREPINEPHRINE 4 MG/250ML-% IV SOLN
0.0000 ug/min | INTRAVENOUS | Status: DC
  Administered 2024-04-13: 2 ug/min via INTRAVENOUS
  Administered 2024-04-14: 6 ug/min via INTRAVENOUS
  Administered 2024-04-15: 3 ug/min via INTRAVENOUS
  Administered 2024-04-15: 10 ug/min via INTRAVENOUS
  Administered 2024-04-15: 25 ug/min via INTRAVENOUS
  Administered 2024-04-16: 36 ug/min via INTRAVENOUS
  Filled 2024-04-13 (×7): qty 250

## 2024-04-13 MED ORDER — LIDOCAINE HCL (PF) 1 % IJ SOLN: 5.0000 mL | INTRAMUSCULAR | Status: DC | PRN

## 2024-04-13 MED ORDER — VITAL AF 1.2 CAL PO LIQD
1000.0000 mL | ORAL | Status: DC
  Administered 2024-04-13 – 2024-04-15 (×3): 1000 mL

## 2024-04-13 MED ORDER — HEPARIN SODIUM (PORCINE) 1000 UNIT/ML DIALYSIS: 1000.0000 [IU] | INTRAMUSCULAR | Status: DC | PRN

## 2024-04-13 MED ORDER — INSULIN ASPART 100 UNIT/ML IJ SOLN
20.0000 [IU] | INTRAMUSCULAR | Status: DC
  Administered 2024-04-13 – 2024-04-15 (×13): 20 [IU] via SUBCUTANEOUS

## 2024-04-13 MED ORDER — INSULIN GLARGINE-YFGN 100 UNIT/ML ~~LOC~~ SOLN
50.0000 [IU] | Freq: Two times a day (BID) | SUBCUTANEOUS | Status: DC
  Administered 2024-04-13 – 2024-04-14 (×3): 50 [IU] via SUBCUTANEOUS
  Filled 2024-04-13 (×5): qty 0.5

## 2024-04-13 NOTE — Progress Notes (Signed)
 [facility] Progress Note  Subjective:  Seen in ICU Remains unresponsive  Vitals:   04/13/24 0700 04/13/24 0731 04/13/24 0800 04/13/24 0830  BP: (!) 89/53  (!) 78/48   Pulse: (!) 57 (!) 58 (!) 58 61  Resp: (!) 26 (!) 26 (!) 26 (!) 26  Temp: (!) 94.5 F (34.7 C) (!) 94.8 F (34.9 C) (!) 95.2 F (35.1 C) (!) 95.5 F (35.3 C)  TempSrc:      SpO2: 97% 96% 92% 91%  Weight:      Height:        Exam: Gen on vent, sedated, no responding Sclera anicteric, throat w/ ETT No jvd or bruits Chest clear anterior RRR no MRG Abd soft markedly obese no mass or ascites +bs MS L AKA  Ext diffuse chronic 1-2+ bilat LE edema Neuro is on vent, sedated   AVG+bruit       Renal-related home meds: Norvasc  10 daily PhosLo  2 AC 3 times daily Coreg  25 twice daily Others: Trelegy Ellipta, trazodone , Flomax , Zoloft , Lyrica , PPI, nortriptyline , insulin , Norco, Valium , Lipitor, aspirin      Last OP HD orders dec 2023 --> 3:15h  129kg  RUE AVG   Heparin  2000   patient was discharged from [facility] due to behavior issues about 16 months ago    CXR 5/12 - bilat perihilar vasc congestion    Assessment/ Plan: Cardiac arrest: 04/08/24, was down for about 23 min. Per CCM Vol overload: w/ vasc congestion by 5/12 CXR. Had 3L UF 5/13 and 3L 3/15. Max UF w/ HD today.  ESRD: doesn't have an OP unit. HD TTS here. HD today.  BP: bp's normal today, s/p nicardipine  gtt. A-line bp's are 140/55 range.  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  Hold binders until eating. Hyperkalemia: resolved.      Larry Poag MD  [facility] 04/13/2024, 10:18 AM  Recent Labs  Lab 04/12/24 0300 04/13/24 0405  HGB 7.6* 7.6*  ALBUMIN  2.4* 2.3*  CALCIUM  7.9* 8.4*  PHOS 3.8 5.8*  CREATININE 4.81* 5.56*  K 4.1 3.8   No results for input(s): "IRON ", "TIBC", "FERRITIN" in the last 168 hours. Inpatient medications:  sodium chloride    Intravenous Once    Chlorhexidine  Gluconate Cloth  6 each Topical Daily   Chlorhexidine  Gluconate Cloth  6 each Topical Q0600   docusate  100 mg Per Tube BID   feeding supplement (PROSource TF20)  60 mL Per Tube BID   heparin   2,000 Units Dialysis Once in dialysis   heparin  injection (subcutaneous)  5,000 Units Subcutaneous Q8H   insulin  aspart  0-20 Units Subcutaneous Q4H   insulin  aspart  20 Units Subcutaneous Q4H   insulin  glargine-yfgn  40 Units Subcutaneous BID   levETIRAcetam   500 mg Per Tube BID   midodrine   10 mg Per Tube TID WC   mupirocin  ointment   Nasal BID   mouth rinse  15 mL Mouth Rinse Q2H   pantoprazole  (PROTONIX ) IV  40 mg Intravenous Q24H   polyethylene glycol  17 g Per Tube Daily   sodium chloride  flush  3-10 mL Intravenous Q12H   vancomycin  variable dose per unstable renal function (pharmacist dosing)   Does not apply See admin instructions    anticoagulant sodium citrate      ceFEPime  (MAXIPIME ) IV Stopped (04/12/24 2252)   feeding supplement (OSMOLITE 1.5 CAL) 50 mL/hr at 04/13/24 0800   acetaminophen  **OR** acetaminophen  (TYLENOL ) oral liquid 160 mg/5 mL **  OR** acetaminophen , alteplase , anticoagulant sodium citrate , docusate, heparin , lidocaine  (PF), lidocaine -prilocaine , ondansetron  (ZOFRAN ) IV, mouth rinse, pentafluoroprop-tetrafluoroeth, polyethylene glycol, sodium chloride  flush

## 2024-04-13 NOTE — Progress Notes (Signed)
 Brief Nutrition Support Update  Received secure chat from Pharmacist about elevated CBGs and patient on very high insulin  amount. CBG's noted to be 281-366 x24 hours. Patient being followed by RD, last seen 5/14. Patient receiving goal TF's of Osmolite 1.5 @ 50 via OGT. This is providing 244g carbohydrates.  He remains on HD.  Patient is currently intubated on ventilator support MV: 12.7 L/min Temp (24hrs), Avg:95.1 F (35.1 C), Min:93.9 F (34.4 C), Max:97.2 F (36.2 C)   Meds: Colace, Miralax , Levophed  added on to start this afternoon  Labs: Na 131, Creatinine 5.56, Phosphorus 5.8, HA1C 8.5  Interventions: - New TF regimen via OGT: Vital 1.2 at 70 ml/h (1680 ml per day) Provides 2016 kcal, 126 gm protein, 1362 ml free water daily *Provides 186g carbohydrates     Scheryl Cushing RD, LDN Contact via Secure Chat.

## 2024-04-13 NOTE — Progress Notes (Signed)
 NAME:  Jeffrey [name], MRN:  [id number], DOB:  [birth year], LOS: 5 ADMISSION DATE:  04/08/2024, CONSULTATION DATE: 04/09/2024 REFERRING MD: EDP, CHIEF COMPLAINT: cardiac arrest    History of Present Illness:  A 63 yo male poorly compliant with dialysis (leaves ama as well), presented to hospital for sessions of late; recent hospitalization late April for undetermined arrhythmia who called ems with resp distress found to be in pea and acls started with EMS services. Reportedly missed multiple sessions of dialysis last week. Per chart review pt was in PEA, undergoing ACLS for ~23 mins until ROSC and unfortunately deteriorated shortly after again in PEA ~10 mins with ROSC now on escalated pressors and inotropic support.    CXR revealed pulmonary edema. EDP was gracious to place CVC and arterial line for pt. Unable to reach family at this time. CCM was asked to admit pt. All history is obtained from chart review as pt is intubated and unresponsive.    Cth pending   Pt seen with R facial twitching, unresponsive on low dose dose propofol , no purposeful movement. On 20epi and 0.03 vasopressin . NE on hold. Pt was given TNK during code and has epistaxis and bleeding from line sites. Labs being sent now.   Pertinent  Medical History  ESRD on HD, poorly adherent OSA, intermittent compliance with cpap T2DM with hyperglycemia HTN Hyperlipidemia Chronic anemia 2/2 ESRD COPD Chronic hypoxic resp failure on 3L baseline  Significant Hospital Events: Including procedures, antibiotic start and stop dates in addition to other pertinent events   5/12: admitted to ICU post arrest  5/13: on HD, Propofol  for sedation, PRVC 26/490/+8/70%, ABG 7.29/56/75/93%. Positive fluid balance 1.3 since admission.  5/14: hypotension in am, off Levophed  since noon time after IV Albumin , on insulin  drip, on NS 0.9% @ 75 cc/hr, PRVC 26/490/+8/50%. On Propofol  and Fentanyl , on TTM. ABG is pending. Negative 1.1 L with HD, now  positive 0.6 L fluid balance in the last few hrs. On TF 5/15 1 PRBC transition off insulin  gtt  5/16 for MRI brain.   Interim History / Subjective:  CBGs have been high  overnight -- mid 200s to mid 300s -- but no changes made   Hgb 7.6 plt 103  Objective    Blood pressure (!) 82/48, pulse (!) 54, temperature (!) 94.3 F (34.6 C), resp. rate (!) 26, height 5\' 5"  (1.651 m), weight (!) 136.8 kg, SpO2 94%.    Vent Mode: PRVC FiO2 (%):  [40 %-100 %] 70 % Set Rate:  [26 bmp] 26 bmp Vt Set:  [490 mL] 490 mL PEEP:  [8 cmH20] 8 cmH20 Plateau Pressure:  [24 cmH20-29 cmH20] 28 cmH20   Intake/Output Summary (Last 24 hours) at 04/13/2024 0737 Last data filed at 04/13/2024 0300 Gross per 24 hour  Intake 1199 ml  Output 125 ml  Net 1074 ml   Filed Weights   04/09/24 1154 04/10/24 0326 04/11/24 1050  Weight: (!) 139.8 kg (!) 140.8 kg (!) 136.8 kg    Examination: Gen:      Intubated, sedated, acutely and chronically ill appearing HEENT:  ETT to vent Lungs:    sounds of mechanical ventilation auscultated no wheeze CV:         diminished, RRR Abd:      Soft, nontender Ext:    LUE AVF Skin:      Warm and dry; no rashes Neuro:   not sedated, not responsive, RASS -5   MRI brain reviewed - consistent with anoxic encephalopathy  CBGs elevated>300 Na 131 K 3.8 Cr 5.56 Phos 5.8 WBC 6.5 Hgb 7.6 Platelets 90  Resolved problem list   Lactic acidosis  Shock  HyperK   Assessment and Plan    OOH cardiac arrest x2 Anoxic Brain Injury Sz like activity  -23 min and 10 min PEA arrests MRI brain confirms anoxic encephalopathy P -avoid fevers -- has completed 40hr ttm  -off cont sedation -cont keppra   -as of 5/16, does have spontaneous respirations; d/w RN that he is not a candidate for apnea testing.  - ongoing goals of care in process, will involve palliative care  AoC hypoxic and hypercarbic resp failure COPD Pseudomonas pneumonia Acute pulm edema OSA  P -VAP, pulm  hygiene -PRN cxr, abg -cont cefepime , d/c vanco based on tracheal aspirate -cont efforts at vent weaning. Does have some spontaneous drive 1/61 but inadequate mechanics to continue PSV trial. Mentation ultimately would preclude extubation.   AoC anemia Thrombocytopenia, stable  -1 PRBC 5/15 for hgb 6.8  P -follow CBC and transfuse for Hgb<7  ESRD HypoNa, mild  P -HD per nephro   Elevated LFTs  -PRN LFTs   HTN dHF  P - volume off via HD   DM2 w hyperglycemia  -Transitioning off insulin  gtt 5/15 -r SSI + 20 units novolog  q4hr + semglee  40 BID   GERD  -PPI  RLE pressure wound  -WOC  Inadequate PO intake -EN per RDN   GOC - daughter notified about MRI results. palliative care consulted  Best Practice (right click and "Reselect all SmartList Selections" daily)   Diet/type: tube feeds DVT prophylaxis prophylactic heparin   Pressure ulcer(s): identified on: 14/May/2025 GI prophylaxis: PPI Lines: Central line and Arterial Line Foley:  Yes, and it is still needed Code Status:  full code Last date of multidisciplinary goals of care discussion [daughter updated at bedside 5/16.]  The patient is critically ill due to respiratory failure, anoxic encephalopathy.  Critical care was necessary to treat or prevent imminent or life-threatening deterioration.  Critical care was time spent personally by me on the following activities: development of treatment plan with patient and/or surrogate as well as nursing, discussions with consultants, evaluation of patient's response to treatment, examination of patient, obtaining history from patient or surrogate, ordering and performing treatments and interventions, ordering and review of laboratory studies, ordering and review of radiographic studies, pulse oximetry, re-evaluation of patient's condition and participation in multidisciplinary rounds.   Critical Care Time devoted to patient care services described in this note is 35 minutes.  This time reflects time of care of this signee Carynn Felling S Joya Willmott . This critical care time does not reflect separately billable procedures or procedure time, teaching time or supervisory time of PA/NP/Med student/Med Resident etc but could involve care discussion time.       Aleck Hurdle [facility] 04/13/2024 7:47 AM  Pager: see AMION  If no response to pager , please call critical care on call (see AMION) until 7pm After 7:00 pm call Elink    Labs   CBC: Recent Labs  Lab 04/08/24 2140 04/08/24 2148 04/09/24 0030 04/09/24 0326 04/10/24 1413 04/11/24 0300 04/11/24 1258 04/12/24 0300 04/13/24 0405  WBC 14.0*  --  9.4  --   --  8.5  --  8.0 6.5  NEUTROABS 11.0*  --   --   --   --   --   --   --   --   HGB 8.5*   < >  8.4*   < > 7.5* 6.8* 8.3* 7.6* 7.6*  HCT 29.2*   < > 27.9*   < > 22.0* 21.8* 26.3* 24.2* 24.6*  MCV 99.0  --  94.9  --   --  92.8  --  92.4 93.2  PLT 127*  --  121*  124*  --   --  103*  --  103* 90*   < > = values in this interval not displayed.    Basic Metabolic Panel: Recent Labs  Lab 04/09/24 2318 04/10/24 0407 04/10/24 1043 04/10/24 1413 04/10/24 1830 04/11/24 0300 04/12/24 0300 04/13/24 0405  NA 137  --  135 134*  --  136 132* 131*  K 4.1  --  3.9 4.0  --  3.9 4.1 3.8  CL 98  --  97*  --   --  100 95* 93*  CO2 27  --  24  --   --  23 26 24   GLUCOSE 217*  --  227*  --   --  167* 336* 321*  BUN 58*  --  64*  --   --  79* 63* 87*  CREATININE 5.12*  --  5.37*  --   --  5.85* 4.81* 5.56*  CALCIUM  8.3*  --  8.4*  --   --  8.0* 7.9* 8.4*  MG 1.8 1.9  --   --  2.4 2.3 2.1 2.4  PHOS 3.8 3.5  --   --  3.7 4.2 3.8 5.8*   GFR: Estimated Creatinine Clearance: 17.6 mL/min (A) (by C-G formula based on SCr of 5.56 mg/dL (H)). Recent Labs  Lab 04/08/24 2218 04/09/24 0030 04/11/24 0300 04/12/24 0300 04/13/24 0405  PROCALCITON  --   --  23.66  --   --   WBC  --  9.4 8.5 8.0 6.5  LATICACIDVEN 3.4* 1.1  --   --   --      Liver Function Tests: Recent Labs  Lab 04/08/24 2140 04/10/24 0407 04/11/24 0300 04/12/24 0300 04/13/24 0405  AST 529* 172* 53* 37 24  ALT 637* 530* 283* 194* 140*  ALKPHOS 92 84 63 84 92  BILITOT 0.5 0.4 0.5 0.7 0.5  PROT 5.8* 6.2* 5.5* 5.7* 5.6*  ALBUMIN  2.8* 2.8* 2.7* 2.4* 2.3*   No results for input(s): "LIPASE", "AMYLASE" in the last 168 hours. No results for input(s): "AMMONIA" in the last 168 hours.  ABG    Component Value Date/Time   PHART 7.360 04/10/2024 1413   PCO2ART 46.7 04/10/2024 1413   PO2ART 73 (L) 04/10/2024 1413   HCO3 26.4 04/10/2024 1413   TCO2 28 04/10/2024 1413   ACIDBASEDEF 1.4 04/08/2024 2227   ACIDBASEDEF 1.0 04/08/2024 2227   O2SAT 94 04/10/2024 1413     Coagulation Profile: Recent Labs  Lab 04/09/24 0030  INR 1.5*    Cardiac Enzymes: No results for input(s): "CKTOTAL", "CKMB", "CKMBINDEX", "TROPONINI" in the last 168 hours.  HbA1C: Hgb A1c MFr Bld  Date/Time Value Ref Range Status  03/22/2024 04:38 AM 8.5 (H) 4.8 - 5.6 % Final    Comment:    (NOTE) Pre diabetes:          5.7%-6.4%  Diabetes:              >6.4%  Glycemic control for   <7.0% adults with diabetes   09/17/2023 02:27 AM 11.1 (H) 4.8 - 5.6 % Final    Comment:    (NOTE) Pre diabetes:  5.7%-6.4%  Diabetes:              >6.4%  Glycemic control for   <7.0% adults with diabetes     CBG: Recent Labs  Lab 04/12/24 1218 04/12/24 1533 04/12/24 1928 04/12/24 2338 04/13/24 0336  GLUCAP 366* 281* 310* 335* 334*

## 2024-04-13 NOTE — Progress Notes (Signed)
 Daily Progress Note   Patient Name: Jeffrey [name]       Date: 04/13/2024 DOB: 01-02-1961  Age: 63 y.o. MRN#: [id number] Attending Physician: Aleck Hurdle, MD Primary Care Physician: Trellis Fries, MD Admit Date: 04/08/2024  Reason for Consultation/Follow-up: Establishing goals of care  Subjective: Unresponsive  Length of Stay: 5  Current Medications: Scheduled Meds:   sodium chloride    Intravenous Once   Chlorhexidine  Gluconate Cloth  6 each Topical Daily   Chlorhexidine  Gluconate Cloth  6 each Topical Q0600   docusate  100 mg Per Tube BID   heparin   2,000 Units Dialysis Once in dialysis   heparin  injection (subcutaneous)  5,000 Units Subcutaneous Q8H   insulin  aspart  0-20 Units Subcutaneous Q4H   insulin  aspart  20 Units Subcutaneous Q4H   insulin  glargine-yfgn  50 Units Subcutaneous BID   levETIRAcetam   500 mg Per Tube BID   midodrine   10 mg Per Tube TID WC   mupirocin  ointment   Nasal BID   mouth rinse  15 mL Mouth Rinse Q2H   pantoprazole  (PROTONIX ) IV  40 mg Intravenous Q24H   polyethylene glycol  17 g Per Tube Daily   sodium chloride  flush  3-10 mL Intravenous Q12H    Continuous Infusions:  anticoagulant sodium citrate      ceFEPime  (MAXIPIME ) IV Stopped (04/12/24 2252)   feeding supplement (VITAL AF 1.2 CAL) 1,000 mL (04/13/24 1344)   norepinephrine  (LEVOPHED ) Adult infusion 1 mcg/min (04/13/24 1344)    PRN Meds: acetaminophen  **OR** acetaminophen  (TYLENOL ) oral liquid 160 mg/5 mL **OR** acetaminophen , alteplase , anticoagulant sodium citrate , docusate, heparin , lidocaine  (PF), lidocaine -prilocaine , ondansetron  (ZOFRAN ) IV, mouth rinse, pentafluoroprop-tetrafluoroeth, polyethylene glycol, sodium chloride  flush  Physical Exam Constitutional:      General: He is not in  acute distress.    Appearance: He is ill-appearing.  Pulmonary:     Comments: intubated Skin:    General: Skin is warm and dry.  Neurological:     Comments: unresponsive             Vital Signs: BP (!) 100/48   Pulse 66   Temp 98.1 F (36.7 C)   Resp (!) 26   Ht 5\' 5"  (1.651 m)   Wt (!) 136.8 kg   SpO2 92%   BMI 50.19 kg/m  SpO2: SpO2: 92 % O2 Device: O2 Device: Ventilator O2 Flow Rate:  Intake/output summary:  Intake/Output Summary (Last 24 hours) at 04/13/2024 1359 Last data filed at 04/13/2024 1344 Gross per 24 hour  Intake 1167.89 ml  Output 260 ml  Net 907.89 ml   LBM: Last BM Date : 04/11/24 Baseline Weight: Weight: (!) 142.7 kg (Bedside) Most recent weight: Weight: (!) 136.8 kg       Patient Active Problem List   Diagnosis Date Noted   Cardiac arrest (HCC) 04/08/2024   FHx: SVT (supraventricular tachycardia) 03/21/2024   Wide-complex tachycardia 03/21/2024   Hyperosmolar hyperglycemic state (HHS) (HCC) 09/17/2023   Chest pain of uncertain etiology 09/16/2023   Visual field defect of right eye 07/20/2023   Anemia due to chronic kidney disease, on chronic dialysis (HCC) 07/19/2023   Hyperuricemia 07/19/2023   Left arm pain 07/17/2023   Shortness of breath 06/01/2023   Hyperglycemia due to diabetes mellitus (HCC) 04/06/2023   Accelerated hypertension 04/06/2023   ESRD on dialysis (HCC) 03/03/2023   Acute on chronic respiratory failure with hypoxia (HCC) 11/24/2022   Uremia 11/24/2022   Encounter for removal of sutures 07/15/2022   Stress reaction of bone 07/12/2022   Benign prostatic hyperplasia with weak urinary stream 12/30/2021   Swelling of right upper extremity 11/07/2021   S/P reverse total shoulder arthroplasty, right 10/27/2021   AV graft malfunction (HCC) 09/01/2021   Dialysis AV fistula malfunction, initial encounter (HCC) 08/30/2021   Mood disorder (HCC) 08/30/2021   Disorder of ligament, right ankle 08/03/2021   Pain in right ankle and  joints of right foot 08/03/2021   Local infection due to central venous catheter, sequela 07/21/2021   Acquired absence of left leg below knee (HCC) 06/21/2021   S/P AKA (above knee amputation), left (HCC) 05/04/2021   Bacteremia 04/20/2021   Other specified joint disorders, left knee 04/20/2021   Iron  deficiency anemia, unspecified 04/13/2021   Anemia    Infection of prosthetic left knee joint (HCC)    MRSA bacteremia 03/30/2021   Left knee pain    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    Fever 03/29/2021   End stage renal disease (HCC) 03/29/2021   COPD (chronic obstructive pulmonary disease) (HCC) 03/29/2021   Type 2 diabetes mellitus with hyperlipidemia (HCC) 03/29/2021   PTSD (post-traumatic stress disorder) 03/29/2021   Dyspnea, unspecified 10/12/2020   Pain in arm, unspecified 10/12/2020   Mild protein-calorie malnutrition (HCC) 09/06/2020   Status post cervical spinal fusion 08/31/2020   Controlled substance agreement signed 08/30/2020   Chronic pain 08/20/2020   Secondary hyperparathyroidism of renal origin (HCC) 08/15/2020   Allergy, unspecified, sequela 07/21/2020   Coagulation defect, unspecified (HCC) 07/21/2020   Encounter for immunization 07/21/2020   Personal history of anaphylaxis 07/21/2020   Cervical myelopathy (HCC) 07/03/2020   Bilateral bunions 05/08/2020   Hammer toes of both feet 05/08/2020   Onychomycosis of toenail 05/08/2020   Muscle cramps 05/07/2020   History of 2019 novel coronavirus disease (COVID-19) 11/06/2019   Cervical radiculopathy 10/28/2019   Volume overload 06/25/2019   Degenerative disc disease, cervical 04/02/2019   History of MRSA infection 10/04/2018   Spinal stenosis of lumbosacral region 08/28/2018   Primary osteoarthritis of both shoulders 02/12/2018   Rotator cuff arthropathy of both shoulders 02/12/2018   Lumbar degenerative disc disease 01/25/2018   Chronic midline low back pain without sciatica 12/15/2017    History of colon polyps 08/15/2016   Mild episode of recurrent major depressive disorder (HCC) 05/30/2016   Vitamin D  deficiency 04/18/2016   Chronic insomnia 12/21/2015  Pulmonary hypertension, mild (HCC) 04/29/2014   Chronic gastritis without bleeding 01/13/2014   Diabetic polyneuropathy associated with type 2 diabetes mellitus (HCC) 01/13/2014   Chronic diastolic heart failure (HCC) 12/24/2013   OSA (obstructive sleep apnea) 12/24/2013   Primary osteoarthritis of both knees 08/26/2013   Primary osteoarthritis of right knee 08/26/2013   Status post total bilateral knee replacement 05/02/2013   Mixed hyperlipidemia 07/30/2012   Class 3 severe obesity due to excess calories with serious comorbidity and body mass index (BMI) of 45.0 to 49.9 in adult 07/27/2012   Gastroesophageal reflux disease without esophagitis 05/12/2008   Erectile dysfunction due to diseases classified elsewhere 01/19/2005    Palliative Care Assessment & Plan   HPI: 63 y.o. male   admitted on 04/08/2024 who called EMS with respiratory distress, found to be in PEA arrest and ACLS was started with EMS.   According to EMR ACLS proceeded for 23 minutes until ROSC and unfortunately deteriorated again in PEA arrest for 10 minutes.  Currently in the ICU ;  intubated on pressors   Patient is known barriers to compliance with dialysis, has left AMA in the past.   According to family,  patient receives dialysis here at the hospital/[facility]  secondary to a confrontation at the dialysis center in which he is no longer able to receive treatment .   Family face treatment option decisions, advanced directive decisions and anticipatory care needs.  Assessment: Follow up today with Mr Mcdill and his family.  They share they have not heard good news and are feeling sad. We review his MRI and clinical exam that point to poor outcome.  We review that cardiac arrest has poor outcomes in general and Mr Bilal is not showing any of the signs  we look for that may suggest favorable outcome.  Family shares they understand what the medical team is saying but they are people of faith and would like to give it some time before making any decisions.  We discuss that the current care Mr Arizpe is receiving are temporary measures. They understand. I ask if they had ever talked to Mr Eickhoff in the past about what his wishes would be if he were really sick; they share they have talked to him about this but do not want to share his thoughts.  We discuss how the medical team should respond if he were to become unstable/if he arrested again - they tell me they can't answer that question right now and I explain that he will remain full code.  Offered chaplain support which they agreed to.  Per their request together we reviewed his MRI, lab work throughout hospital stay, and chest xrays.   Recommendations/Plan: PMT will continue to follow and facilitate goals of care discussions For now remains full code/full scope Family aware of poor prognosis - request more time prior to making any decisions Chaplain support needed  Care plan was discussed with daughter and ex wife  Thank you for allowing the Palliative Medicine Team to assist in the care of this patient.   Total Time 120 minutes Prolonged Time Billed  yes   Time spent includes: Detailed review of medical records (labs, imaging, vital signs), medically appropriate exam, discussion with treatment team, counseling and educating patient, family and/or staff, documenting clinical information, medication management and coordination of care.     *Please note that this is a verbal dictation therefore any spelling or grammatical errors are due to the "[employer] One" system interpretation.  Huey Madrid  Milagros Alf, DNP, [facility] Palliative Medicine Team Team Phone # [phone]  Pager [phone]

## 2024-04-13 NOTE — Plan of Care (Signed)
 Started Levo today due to low MAP.  Patient otherwise remained stable.  Family meeting today for goals of care.  Giving time for family to come to terms with current medical prognosis.

## 2024-04-14 ENCOUNTER — Inpatient Hospital Stay (HOSPITAL_COMMUNITY)

## 2024-04-14 DIAGNOSIS — J9621 Acute and chronic respiratory failure with hypoxia: Secondary | ICD-10-CM | POA: Diagnosis not present

## 2024-04-14 DIAGNOSIS — J9622 Acute and chronic respiratory failure with hypercapnia: Secondary | ICD-10-CM | POA: Diagnosis not present

## 2024-04-14 DIAGNOSIS — I469 Cardiac arrest, cause unspecified: Secondary | ICD-10-CM | POA: Diagnosis not present

## 2024-04-14 DIAGNOSIS — J449 Chronic obstructive pulmonary disease, unspecified: Secondary | ICD-10-CM | POA: Diagnosis not present

## 2024-04-14 LAB — CBC
HCT: 28 % — ABNORMAL LOW (ref 39.0–52.0)
Hemoglobin: 8.3 g/dL — ABNORMAL LOW (ref 13.0–17.0)
MCH: 27.9 pg (ref 26.0–34.0)
MCHC: 29.6 g/dL — ABNORMAL LOW (ref 30.0–36.0)
MCV: 94 fL (ref 80.0–100.0)
Platelets: 130 10*3/uL — ABNORMAL LOW (ref 150–400)
RBC: 2.98 MIL/uL — ABNORMAL LOW (ref 4.22–5.81)
RDW: 15.8 % — ABNORMAL HIGH (ref 11.5–15.5)
WBC: 9.6 10*3/uL (ref 4.0–10.5)
nRBC: 0.3 % — ABNORMAL HIGH (ref 0.0–0.2)

## 2024-04-14 LAB — GLUCOSE, CAPILLARY
Glucose-Capillary: 163 mg/dL — ABNORMAL HIGH (ref 70–99)
Glucose-Capillary: 174 mg/dL — ABNORMAL HIGH (ref 70–99)
Glucose-Capillary: 177 mg/dL — ABNORMAL HIGH (ref 70–99)
Glucose-Capillary: 231 mg/dL — ABNORMAL HIGH (ref 70–99)
Glucose-Capillary: 247 mg/dL — ABNORMAL HIGH (ref 70–99)
Glucose-Capillary: 254 mg/dL — ABNORMAL HIGH (ref 70–99)

## 2024-04-14 LAB — BASIC METABOLIC PANEL WITH GFR
Anion gap: 14 (ref 5–15)
BUN: 105 mg/dL — ABNORMAL HIGH (ref 8–23)
CO2: 23 mmol/L (ref 22–32)
Calcium: 8.3 mg/dL — ABNORMAL LOW (ref 8.9–10.3)
Chloride: 95 mmol/L — ABNORMAL LOW (ref 98–111)
Creatinine, Ser: 6.47 mg/dL — ABNORMAL HIGH (ref 0.61–1.24)
GFR, Estimated: 9 mL/min — ABNORMAL LOW (ref >60)
Glucose, Bld: 218 mg/dL — ABNORMAL HIGH (ref 70–99)
Potassium: 4.7 mmol/L (ref 3.5–5.1)
Sodium: 132 mmol/L — ABNORMAL LOW (ref 135–145)

## 2024-04-14 NOTE — Progress Notes (Signed)
 [facility] Progress Note  Subjective:  Seen in ICU, unreponsive Had HD last night, 3 L off  Vitals:   04/14/24 0930 04/14/24 0945 04/14/24 1000 04/14/24 1015  BP: (!) 114/54 109/60 103/60 (!) 106/58  Pulse: 71 73 71 68  Resp: (!) 26 (!) 26 (!) 26 (!) 26  Temp: (!) 97.5 F (36.4 C) (!) 97.5 F (36.4 C) (!) 97.5 F (36.4 C) (!) 97.5 F (36.4 C)  TempSrc:      SpO2: 92% 93% 91% 94%  Weight:      Height:        Exam: Gen on vent, sedated, no responding Sclera anicteric, throat w/ ETT No jvd or bruits Chest clear anterior RRR no MRG Abd soft markedly obese no mass or ascites +bs MS L AKA  Ext diffuse chronic 1-2+ bilat LE edema Neuro is on vent, sedated   AVG+bruit       Renal-related home meds: Norvasc  10 daily PhosLo  2 AC 3 times daily Coreg  25 twice daily Others: Trelegy Ellipta, trazodone , Flomax , Zoloft , Lyrica , PPI, nortriptyline , insulin , Norco, Valium , Lipitor, aspirin      Last OP HD orders dec 2023 --> 3:15h  129kg  RUE AVG   Heparin  2000   patient was discharged from [facility] due to behavior issues about 16 months ago    CXR 5/12 - bilat perihilar vasc congestion    Assessment/ Plan: Cardiac arrest: 04/08/24, down for 23 min.  Vol overload: w/ vasc congestion by 5/12 CXR. Had 3L UF w/ HD last night.  ESRD: doesn't have an OP unit. HD tiw here, currently on TTS. Next HD 5/20.  BP: s/p nicardipine  gtt, A-line bp's are wnl now.  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     Larry Poag MD  [facility] 04/14/2024, 10:23 AM  Recent Labs  Lab 04/12/24 0300 04/13/24 0405 04/14/24 0243  HGB 7.6* 7.6* 8.3*  ALBUMIN  2.4* 2.3*  --   CALCIUM  7.9* 8.4* 8.3*  PHOS 3.8 5.8*  --   CREATININE 4.81* 5.56* 6.47*  K 4.1 3.8 4.7   No results for input(s): "IRON ", "TIBC", "FERRITIN" in the last 168 hours. Inpatient medications:  sodium chloride    Intravenous Once   Chlorhexidine  Gluconate Cloth  6 each  Topical Daily   Chlorhexidine  Gluconate Cloth  6 each Topical Q0600   docusate  100 mg Per Tube BID   heparin   2,000 Units Dialysis Once in dialysis   heparin  injection (subcutaneous)  5,000 Units Subcutaneous Q8H   insulin  aspart  0-20 Units Subcutaneous Q4H   insulin  aspart  20 Units Subcutaneous Q4H   insulin  glargine-yfgn  50 Units Subcutaneous BID   levETIRAcetam   500 mg Per Tube BID   midodrine   10 mg Per Tube TID WC   mupirocin  ointment   Nasal BID   mouth rinse  15 mL Mouth Rinse Q2H   pantoprazole  (PROTONIX ) IV  40 mg Intravenous Q24H   polyethylene glycol  17 g Per Tube Daily   sodium chloride  flush  3-10 mL Intravenous Q12H    anticoagulant sodium citrate      ceFEPime  (MAXIPIME ) IV Stopped (04/13/24 2237)   feeding supplement (VITAL AF 1.2 CAL) 70 mL/hr at 04/14/24 0948   norepinephrine  (LEVOPHED ) Adult infusion 4 mcg/min (04/14/24 0948)   acetaminophen  **OR** acetaminophen  (TYLENOL ) oral liquid 160 mg/5 mL **OR** acetaminophen , alteplase , anticoagulant sodium citrate , docusate, feeding supplement (NEPRO CARB STEADY), heparin , heparin , lidocaine  (PF), lidocaine -prilocaine , ondansetron  (ZOFRAN ) IV,  mouth rinse, pentafluoroprop-tetrafluoroeth, polyethylene glycol, sodium chloride  flush

## 2024-04-14 NOTE — Progress Notes (Signed)
 Spoke w pt daughter who added her mother to call.  Discussed updates and ongoing poor neuro exam and imaging c/w severe anoxic injury + associated findings. They share that they saw MRI images yesterday, and did not feel like there was much swelling. I have offered to review these imaging with them-- I appreciate how difficult MRI imaging can be to visualize, especially outside of the medical field.  They are still in the very early stages of starting to consider QOL goals. Palliative care is following and their help is greatly appreciated.    Eston Hence MSN, AGACNP-BC [facility] Pulmonary/Critical Care Medicine 04/14/2024, 1:58 PM

## 2024-04-14 NOTE — Progress Notes (Signed)
 NAME:  Jeffrey [name], MRN:  [id number], DOB:  February 12, 1961, LOS: 6 ADMISSION DATE:  04/08/2024, CONSULTATION DATE: 04/09/2024 REFERRING MD: EDP, CHIEF COMPLAINT: cardiac arrest    History of Present Illness:  A 63 yo male poorly compliant with dialysis (leaves ama as well), presented to hospital for sessions of late; recent hospitalization late April for undetermined arrhythmia who called ems with resp distress found to be in pea and acls started with EMS services. Reportedly missed multiple sessions of dialysis last week. Per chart review pt was in PEA, undergoing ACLS for ~23 mins until ROSC and unfortunately deteriorated shortly after again in PEA ~10 mins with ROSC now on escalated pressors and inotropic support.    CXR revealed pulmonary edema. EDP was gracious to place CVC and arterial line for pt. Unable to reach family at this time. CCM was asked to admit pt. All history is obtained from chart review as pt is intubated and unresponsive.    Cth pending   Pt seen with R facial twitching, unresponsive on low dose dose propofol , no purposeful movement. On 20epi and 0.03 vasopressin . NE on hold. Pt was given TNK during code and has epistaxis and bleeding from line sites. Labs being sent now.   Pertinent  Medical History  ESRD on HD, poorly adherent OSA, intermittent compliance with cpap T2DM with hyperglycemia HTN Hyperlipidemia Chronic anemia 2/2 ESRD COPD Chronic hypoxic resp failure on 3L baseline  Significant Hospital Events: Including procedures, antibiotic start and stop dates in addition to other pertinent events   5/12: admitted to ICU post arrest  5/13: on HD, Propofol  for sedation, PRVC 26/490/+8/70%, ABG 7.29/56/75/93%. Positive fluid balance 1.3 since admission.  5/14: hypotension in am, off Levophed  since noon time after IV Albumin , on insulin  drip, on NS 0.9% @ 75 cc/hr, PRVC 26/490/+8/50%. On Propofol  and Fentanyl , on TTM. ABG is pending. Negative 1.1 L with HD, now  positive 0.6 L fluid balance in the last few hrs. On TF 5/15 1 PRBC transition off insulin  gtt  5/16 for MRI brain. Severe anoxic injury 5/17 palli consult  5/18 incr FiO2. Weaning pressors   Interim History / Subjective:  FiO2 up to 70%  NE at 5 , weaning   HD this morning   Objective    Blood pressure (!) 109/49, pulse 75, temperature (!) 97.5 F (36.4 C), resp. rate (!) 26, height 5\' 5"  (1.651 m), weight 135.4 kg, SpO2 91%.    Vent Mode: PRVC FiO2 (%):  [50 %-70 %] 70 % Set Rate:  [26 bmp] 26 bmp Vt Set:  [490 mL] 490 mL PEEP:  [8 cmH20] 8 cmH20 Plateau Pressure:  [28 cmH20-29 cmH20] 28 cmH20   Intake/Output Summary (Last 24 hours) at 04/14/2024 1010 Last data filed at 04/14/2024 0948 Gross per 24 hour  Intake 1968.4 ml  Output 3640 ml  Net -1671.6 ml   Filed Weights   04/10/24 0326 04/11/24 1050 04/14/24 0500  Weight: (!) 140.8 kg (!) 136.8 kg 135.4 kg    Examination: Gen:      Critically and chronically ill obese adult M  Neuro:  No response to pain. No cough gag corneals or dolls eye. Does initiate respirations. HEENT:  NCAT protuberant tongue ETT secure  Lungs:    mechanically ventilated. Small Vt on PSV  CV:        rr cap refill < 3sec   Abd:     Obese round soft  Ext:    LUE AVF L AKA  Skin:      multiple healed scars. C/d/ no rash   Resolved problem list   Lactic acidosis  Shock  HyperK   Assessment and Plan   OOH cardiac arrest x 2 Anoxic brain injury Brain compression Sz-like activity  -23 min and 10 min PEA arrests MRI brain confirms anoxic encephalopathy associated diffuse edema and tonsillar herniation  -completed ttm course  P -off cont sedation -cont keppra    AoC hypoxic and hypercarbic resp failure COPD w/o exacerbation  Pseudomonas PNA OSA  P -VAP, pulm hygiene -PRN cxr, abg -cefepime  -cont vent wean efforts. Has intrinsic drive but mechanics are inadequate (very low Vt), and mentation would preclude extubation even if volumes  were appropriate   Hypotension -hypotension likely multifactorial -- think that w his intracranial processes a degree of instability is likely neurogenic in etiology. Possible sepsis as well w PsA PNA P -Ne for SBP > 90 -cefepime   -on midodrine  as well   Hx HTN dHF -holding home antihypertensive on NE -volume off via HD   AoC anemia Thrombocytopenia, stable  -1 PRBC 5/15 for hgb 6.8  P -follow CBC and transfuse for Hgb<7  ESRD HypoNa, mild  P -HD per nephro   Elevated LFTs  -PRN LFTs   dHF  Hx HTN  P - volume off via HD  -holding home meds w hypotension above  DM2 w hyperglycemia -rSSI, semglee  50 BID and 20u novolog  4  GERD  -PPI  RLE pressure wound  -WOC  Inadequate PO intake -EN per RDN   GOC - daughter notified about MRI results. palliative care consulted   Best Practice (right click and "Reselect all SmartList Selections" daily)   Diet/type: tube feeds DVT prophylaxis prophylactic heparin   Pressure ulcer(s): identified on: 14/May/2025 GI prophylaxis: PPI Lines: Central line and Arterial Line Foley:  Yes, and it is still needed Code Status:  full code Last date of multidisciplinary goals of care discussion [daughter updated at bedside 5/16.]   CRITICAL CARE Performed by: Delories Fetter   Total critical care time: 36 minutes  Critical care time was exclusive of separately billable procedures and treating other patients. Critical care was necessary to treat or prevent imminent or life-threatening deterioration.  Critical care was time spent personally by me on the following activities: development of treatment plan with patient and/or surrogate as well as nursing, discussions with consultants, evaluation of patient's response to treatment, examination of patient, obtaining history from patient or surrogate, ordering and performing treatments and interventions, ordering and review of laboratory studies, ordering and review of radiographic  studies, pulse oximetry and re-evaluation of patient's condition.  Eston Hence MSN, AGACNP-BC  Pulmonary/Critical Care Medicine Amion for pager 04/14/2024, 10:10 AM

## 2024-04-14 NOTE — Progress Notes (Signed)
 Post HD tx  TX duration: 2.5 hours Patient tolerated well.   Hand-off given to patient's nurse.   Access used: graft Access issues: none Total UF removed: none Medication(s) given: none Post HD VS: 128/72 Post HD weight: unable to obtain   04/14/24 0545  Vitals  Temp 99 F (37.2 C)  BP 128/73  MAP (mmHg) 89  BP Location Right Arm  BP Method Automatic  Patient Position (if appropriate) Lying  Pulse Rate 70  Pulse Rate Source Monitor  ECG Heart Rate 70  Resp (!) 26  Oxygen Therapy  SpO2 95 %  O2 Device Ventilator  Patient Activity (if Appropriate) In bed  Pulse Oximetry Type Continuous  During Treatment Monitoring  Blood Flow Rate (mL/min) 0 mL/min  Arterial Pressure (mmHg) -2.02 mmHg  Venous Pressure (mmHg) -2.42 mmHg  TMP (mmHg) -51.11 mmHg  Ultrafiltration Rate (mL/min) 1815 mL/min  Dialysate Flow Rate (mL/min) 300 ml/min  Dialysate Potassium Concentration 3  Dialysate Calcium  Concentration 2.5  Duration of HD Treatment -hour(s) 2.5 hour(s)  Cumulative Fluid Removed (mL) per Treatment  3250.38  Post Treatment  Dialyzer Clearance Lightly streaked  Hemodialysis Intake (mL) 0 mL  Liters Processed 60  Fluid Removed (mL) 3300 mL  Tolerated HD Treatment Yes  Post-Hemodialysis Comments HD tx achievedas expected, tplerated well.  AVG/AVF Arterial Site Held (minutes) 0 minutes  AVG/AVF Venous Site Held (minutes) 0 minutes  Fistula / Graft Left Upper arm Arteriovenous vein graft  Placement Date/Time: (c) 04/22/23 (c) 1000   Orientation: Left  Access Location: Upper arm  Access Type: Arteriovenous vein graft  Site Condition No complications  Fistula / Graft Assessment Present;Thrill;Bruit  Status Deaccessed  Drainage Description None      [facility]

## 2024-04-14 NOTE — Plan of Care (Signed)
  Problem: Education: Goal: Ability to describe self-care measures that may prevent or decrease complications (Diabetes Survival Skills Education) will improve Outcome: Not Progressing   Problem: Fluid Volume: Goal: Ability to maintain a balanced intake and output will improve Outcome: Progressing   Problem: Metabolic: Goal: Ability to maintain appropriate glucose levels will improve Outcome: Progressing   Problem: Nutritional: Goal: Maintenance of adequate nutrition will improve Outcome: Progressing   Problem: Skin Integrity: Goal: Risk for impaired skin integrity will decrease Outcome: Progressing   Problem: Tissue Perfusion: Goal: Adequacy of tissue perfusion will improve Outcome: Progressing

## 2024-04-15 ENCOUNTER — Inpatient Hospital Stay (HOSPITAL_COMMUNITY)

## 2024-04-15 DIAGNOSIS — G931 Anoxic brain damage, not elsewhere classified: Secondary | ICD-10-CM | POA: Diagnosis not present

## 2024-04-15 DIAGNOSIS — I469 Cardiac arrest, cause unspecified: Secondary | ICD-10-CM | POA: Diagnosis not present

## 2024-04-15 DIAGNOSIS — J9622 Acute and chronic respiratory failure with hypercapnia: Secondary | ICD-10-CM | POA: Diagnosis not present

## 2024-04-15 DIAGNOSIS — E871 Hypo-osmolality and hyponatremia: Secondary | ICD-10-CM

## 2024-04-15 DIAGNOSIS — J9621 Acute and chronic respiratory failure with hypoxia: Secondary | ICD-10-CM | POA: Diagnosis not present

## 2024-04-15 LAB — POCT I-STAT 7, (LYTES, BLD GAS, ICA,H+H)
Acid-base deficit: 3 mmol/L — ABNORMAL HIGH (ref 0.0–2.0)
Bicarbonate: 26.5 mmol/L (ref 20.0–28.0)
Calcium, Ion: 1.14 mmol/L — ABNORMAL LOW (ref 1.15–1.40)
HCT: 26 % — ABNORMAL LOW (ref 39.0–52.0)
Hemoglobin: 8.8 g/dL — ABNORMAL LOW (ref 13.0–17.0)
O2 Saturation: 71 %
Patient temperature: 99.2
Potassium: 5.3 mmol/L — ABNORMAL HIGH (ref 3.5–5.1)
Sodium: 129 mmol/L — ABNORMAL LOW (ref 135–145)
TCO2: 29 mmol/L (ref 22–32)
pCO2 arterial: 77.4 mmHg (ref 32–48)
pH, Arterial: 7.144 — CL (ref 7.35–7.45)
pO2, Arterial: 51 mmHg — ABNORMAL LOW (ref 83–108)

## 2024-04-15 LAB — BASIC METABOLIC PANEL WITH GFR
Anion gap: 12 (ref 5–15)
BUN: 83 mg/dL — ABNORMAL HIGH (ref 8–23)
CO2: 24 mmol/L (ref 22–32)
Calcium: 8.6 mg/dL — ABNORMAL LOW (ref 8.9–10.3)
Chloride: 94 mmol/L — ABNORMAL LOW (ref 98–111)
Creatinine, Ser: 6.05 mg/dL — ABNORMAL HIGH (ref 0.61–1.24)
GFR, Estimated: 10 mL/min — ABNORMAL LOW (ref >60)
Glucose, Bld: 304 mg/dL — ABNORMAL HIGH (ref 70–99)
Potassium: 4.7 mmol/L (ref 3.5–5.1)
Sodium: 130 mmol/L — ABNORMAL LOW (ref 135–145)

## 2024-04-15 LAB — GLUCOSE, CAPILLARY
Glucose-Capillary: 284 mg/dL — ABNORMAL HIGH (ref 70–99)
Glucose-Capillary: 283 mg/dL — ABNORMAL HIGH (ref 70–99)
Glucose-Capillary: 286 mg/dL — ABNORMAL HIGH (ref 70–99)
Glucose-Capillary: 295 mg/dL — ABNORMAL HIGH (ref 70–99)
Glucose-Capillary: 279 mg/dL — ABNORMAL HIGH (ref 70–99)
Glucose-Capillary: 270 mg/dL — ABNORMAL HIGH (ref 70–99)
Glucose-Capillary: 297 mg/dL — ABNORMAL HIGH (ref 70–99)

## 2024-04-15 LAB — CBC
HCT: 26.4 % — ABNORMAL LOW (ref 39.0–52.0)
Hemoglobin: 7.8 g/dL — ABNORMAL LOW (ref 13.0–17.0)
MCH: 28 pg (ref 26.0–34.0)
MCHC: 29.5 g/dL — ABNORMAL LOW (ref 30.0–36.0)
MCV: 94.6 fL (ref 80.0–100.0)
Platelets: 102 10*3/uL — ABNORMAL LOW (ref 150–400)
RBC: 2.79 MIL/uL — ABNORMAL LOW (ref 4.22–5.81)
RDW: 15.9 % — ABNORMAL HIGH (ref 11.5–15.5)
WBC: 7.9 10*3/uL (ref 4.0–10.5)
nRBC: 0 % (ref 0.0–0.2)

## 2024-04-15 LAB — PHOSPHORUS: Phosphorus: 5.9 mg/dL — ABNORMAL HIGH (ref 2.5–4.6)

## 2024-04-15 MED ORDER — DARBEPOETIN ALFA 60 MCG/0.3ML IJ SOSY
60.0000 ug | PREFILLED_SYRINGE | INTRAMUSCULAR | Status: DC
  Administered 2024-04-15: 60 ug via SUBCUTANEOUS
  Filled 2024-04-15: qty 0.3

## 2024-04-15 MED ORDER — POLYVINYL ALCOHOL 1.4 % OP SOLN
2.0000 [drp] | Freq: Four times a day (QID) | OPHTHALMIC | Status: DC
Start: 2024-04-15 — End: 2024-04-16
  Administered 2024-04-15 – 2024-04-16 (×3): 2 [drp] via OPHTHALMIC
  Filled 2024-04-15 (×2): qty 15

## 2024-04-15 MED ORDER — INSULIN GLARGINE-YFGN 100 UNIT/ML ~~LOC~~ SOLN
60.0000 [IU] | Freq: Two times a day (BID) | SUBCUTANEOUS | Status: DC
  Administered 2024-04-15 (×2): 60 [IU] via SUBCUTANEOUS
  Filled 2024-04-15 (×3): qty 0.6

## 2024-04-15 MED ORDER — SODIUM BICARBONATE 8.4 % IV SOLN
50.0000 meq | Freq: Once | INTRAVENOUS | Status: AC
  Administered 2024-04-15: 50 meq via INTRAVENOUS

## 2024-04-15 MED ORDER — VASOPRESSIN 20 UNITS/100 ML INFUSION FOR SHOCK: 0.0000 [IU]/min | INTRAVENOUS | Status: DC

## 2024-04-15 MED ORDER — EPINEPHRINE HCL 5 MG/250ML IV SOLN IN NS
0.5000 ug/min | INTRAVENOUS | Status: DC
  Administered 2024-04-15: 0.5 ug/min via INTRAVENOUS

## 2024-04-15 MED ORDER — CALCIUM GLUCONATE-NACL 2-0.675 GM/100ML-% IV SOLN
2.0000 g | Freq: Once | INTRAVENOUS | Status: AC
  Administered 2024-04-15: 2000 mg via INTRAVENOUS
  Filled 2024-04-15: qty 100

## 2024-04-15 MED ORDER — EPINEPHRINE HCL 5 MG/250ML IV SOLN IN NS
INTRAVENOUS | Status: AC
  Filled 2024-04-15: qty 250

## 2024-04-15 MED ORDER — VASOPRESSIN 20 UNITS/100 ML INFUSION FOR SHOCK
INTRAVENOUS | Status: AC
  Administered 2024-04-15: 0.03 [IU]/min via INTRAVENOUS
  Filled 2024-04-15: qty 100

## 2024-04-15 MED ORDER — CHLORHEXIDINE GLUCONATE CLOTH 2 % EX PADS: 6.0000 | MEDICATED_PAD | Freq: Every day | CUTANEOUS | Status: DC

## 2024-04-15 MED ORDER — INSULIN ASPART 100 UNIT/ML IJ SOLN
25.0000 [IU] | INTRAMUSCULAR | Status: DC
  Administered 2024-04-15 (×3): 25 [IU] via SUBCUTANEOUS

## 2024-04-15 MED ORDER — SODIUM CHLORIDE 3 % IV BOLUS
250.0000 mL | Freq: Once | INTRAVENOUS | Status: AC
  Administered 2024-04-15: 250 mL via INTRAVENOUS
  Filled 2024-04-15: qty 500

## 2024-04-15 MED ORDER — NAPHAZOLINE-GLYCERIN 0.012-0.25 % OP SOLN: 1.0000 [drp] | Freq: Four times a day (QID) | OPHTHALMIC | Status: DC

## 2024-04-15 NOTE — Progress Notes (Addendum)
 eLink Physician-Brief Progress Note Patient Name: Jeffrey [name] DOB: March 19, 1961 MRN: [id number]   Date of Service  04/15/2024  HPI/Events of Note  63 year old man ESRD on HD with history of noncompliance, hypertension, diabetes on insulin , chronic hypoxemia admitted to the ICU after reported 23-minute PEA arrest and subsequent second 10-minute PEA arrest now neurologically devastated with clear evidence of hypoxemia related anoxic brain injury on MRI.   Patient went from stable vasopressor requirements at 24 mcg and norepinephrine  is suddenly becoming bradycardic and hypotensive requiring norepinephrine , vasopressin , and initiation of epinephrine .  No identifiable etiology noted-no ventilator dyssynchrony, some hypoxemia noted, no obvious mucous plugging, no hypoglycemia.  Ground team evaluation requested, code cart was opened  eICU Interventions  Patient received bicarbonate, calcium , and 1 mg of atropine.  She was rapidly escalated on epinephrine  on verbal orders.  I-STAT and chest radiograph ordered.  Head of bed elevated with concern for neurogenic component.  Ultimately able to back off on vasopressors with ground team at bedside.   2337 -patient had additional 2 episodes where he became bradycardic, responded to atropine and then became hypertensive.  During both episodes required rapid escalation and then de-escalation of vasopressors to maintain vasoplegia.  Concerned that the patient is actively herniating.  Will administer hypertonic saline bolus.  Discussed again with the family that the patient is likely passing actively or transitioning to likely brain death.  Family inquired about pain and discomfort-we discussed comfort measures and lieu of current care.  They were not interested in changing CODE STATUS or comfort measures at this time.  4098 -patient had another episode of bradycardia with no response to atropine.  Patient is currently on norepinephrine , vasopressin , and  epinephrine .  Family is at bedside.  Explained that the patient is likely about to have a cardiac arrest.  No reversible etiologies have been identified.  Anticipate futility of CODE BLUE which was explained to the family as well.  Intervention Category Major Interventions: Code management / supervision  Calleigh Lafontant 04/15/2024, 8:07 PM

## 2024-04-15 NOTE — Plan of Care (Signed)
   Problem: Fluid Volume: Goal: Ability to maintain a balanced intake and output will improve Outcome: Progressing   Problem: Metabolic: Goal: Ability to maintain appropriate glucose levels will improve Outcome: Progressing   Problem: Nutritional: Goal: Maintenance of adequate nutrition will improve Outcome: Progressing Goal: Progress toward achieving an optimal weight will improve Outcome: Progressing   Problem: Skin Integrity: Goal: Risk for impaired skin integrity will decrease Outcome: Progressing   Problem: Tissue Perfusion: Goal: Adequacy of tissue perfusion will improve Outcome: Progressing

## 2024-04-15 NOTE — TOC Progression Note (Signed)
 Transition of Care [facility]) - Progression Note    Patient Details  Name: Jeffrey [name] MRN: [id number] Date of Birth: 1961/09/30  [facility]) CM/SW Contact  Tom-Johnson, Angelique Ken, RN Phone Number: 04/15/2024, 1:45 PM  Clinical Narrative:     Patient continues to be intubated 2/2 Anoxic Brain Injury by MRI. Palliative following for GOC with family.   CM will continue to follow and render support at this time.         Expected Discharge Plan and Services                                               Social Determinants of Health (SDOH) Interventions SDOH Screenings   Food Insecurity: No Food Insecurity (04/03/2024)   Received from [facility]  Housing: Low Risk  (04/03/2024)   Received from [facility]  Recent Concern: Housing - High Risk (01/08/2024)   Received from [facility]  Transportation Needs: Unmet Transportation Needs (04/03/2024)   Received from [facility]  Utilities: Not At Risk (04/03/2024)   Received from [facility]  Financial Resource Strain: Low Risk  (04/03/2024)   Received from [facility]  Physical Activity: Inactive (09/08/2021)   Received from [facility], [facility]  Social Connections: Moderately Isolated (09/07/2022)   Received from [facility], [facility]  Stress: Stress Concern Present (09/07/2022)   Received from [facility]  Tobacco Use: Medium Risk (04/02/2024)    Readmission Risk Interventions    04/09/2024    2:13 PM 06/02/2023    4:31 PM 04/24/2023    4:36 PM  Readmission Risk Prevention Plan  Transportation Screening Complete Complete Complete  Medication Review (RN Care Manager) Complete Complete Referral to Pharmacy  PCP or Specialist appointment within 3-5 days of discharge Complete  Complete Complete  HRI or Home Care Consult Complete Complete Complete  SW Recovery Care/Counseling Consult Complete Complete Complete  Palliative Care Screening Not Applicable Not Applicable Not Applicable  Skilled Nursing Facility Not Applicable Not Applicable Not Applicable

## 2024-04-15 NOTE — Progress Notes (Signed)
 [facility] Progress Note  Subjective:  Last HD on 5/17 with 3.3 kg UF.  Had 125 mL UOP over 5/18.  Primary team spoke with patient's family and per charting they are still in th early stages of starting to consider quality of life goals.  He has been on levo at 2 mcg/min.     Review of systems:  unable to obtain secondary to encephalopathy    Vitals:   04/15/24 0445 04/15/24 0500 04/15/24 0515 04/15/24 0530  BP: 130/70 112/62 111/63 111/63  Pulse: 66 69 65 65  Resp:  (!) 26 (!) 26 (!) 26  Temp: (!) 96.6 F (35.9 C) (!) 96.6 F (35.9 C) (!) 96.4 F (35.8 C) (!) 96.3 F (35.7 C)  TempSrc:      SpO2: 96% 94% 95% 96%  Weight:      Height:        Physical Exam:   General adult male in bed critically ill  HEENT normocephalic atraumatic  Neck supple trachea midline Lungs coarse mechanical breath sounds; FIO2 70 and PEEP 8 Heart S1S1 no rub  Abdomen obese habitus; soft nontender nondistended Extremities trace to 1+ edema appreciated Neuro does not respond to exam; no continuous sedation currently running  Access: LUE AVG with bruit       Renal-related home meds: Norvasc  10 daily PhosLo  2 AC 3 times daily Coreg  25 twice daily Others: Trelegy Ellipta, trazodone , Flomax , Zoloft , Lyrica , PPI, nortriptyline , insulin , Norco, Valium , Lipitor, aspirin      Last OP HD orders dec 2023 --> 3:15h  129kg  RUE AVG   Heparin  2000   patient was discharged from [facility] due to behavior issues about 16 months ago    CXR 5/12 - bilat perihilar vasc congestion    Assessment/ Plan: Cardiac arrest: 04/08/24, down for 23 min.  Volume overload: w/ vasc congestion by 5/12 CXR. Optimize UF with HD as tolerated ESRD: doesn't have an OP unit after being dismissed from area clinics.  Receives HD three times a week here at [facility], currently on TTS schedule.  Note may need to transition to nepro feeds. Will discontinue foley Anoxic encephalopathy s/p cardiac arrest - palliative care is  following Hypotension:hx of HTN.  s/p nicardipine  gtt earlier per charting.  Now patient is on scheduled midodrine . Also on levo  Anemia of esrd: resume ESA.  Aranesp  60 mcg weekly on Mondays for now   Secondary hyperparathyroidism: hyperphos on last check.  Update phos in AM  Disposition - continue goals of care discussions.  Intubated in ICU post-arrest  Recent Labs  Lab 04/12/24 0300 04/13/24 0405 04/14/24 0243 04/15/24 0400  HGB 7.6* 7.6* 8.3* 7.8*  ALBUMIN  2.4* 2.3*  --   --   CALCIUM  7.9* 8.4* 8.3* 8.6*  PHOS 3.8 5.8*  --   --   CREATININE 4.81* 5.56* 6.47* 6.05*  K 4.1 3.8 4.7 4.7   No results for input(s): "IRON ", "TIBC", "FERRITIN" in the last 168 hours. Inpatient medications:  sodium chloride    Intravenous Once   Chlorhexidine  Gluconate Cloth  6 each Topical Daily   Chlorhexidine  Gluconate Cloth  6 each Topical Q0600   docusate  100 mg Per Tube BID   heparin   2,000 Units Dialysis Once in dialysis   heparin  injection (subcutaneous)  5,000 Units Subcutaneous Q8H   insulin  aspart  0-20 Units Subcutaneous Q4H   insulin  aspart  20 Units Subcutaneous Q4H   insulin  glargine-yfgn  50 Units Subcutaneous BID   levETIRAcetam   500 mg  Per Tube BID   midodrine   10 mg Per Tube TID WC   mupirocin  ointment   Nasal BID   mouth rinse  15 mL Mouth Rinse Q2H   pantoprazole  (PROTONIX ) IV  40 mg Intravenous Q24H   polyethylene glycol  17 g Per Tube Daily   sodium chloride  flush  3-10 mL Intravenous Q12H    anticoagulant sodium citrate      ceFEPime  (MAXIPIME ) IV Stopped (04/14/24 2135)   feeding supplement (VITAL AF 1.2 CAL) 1,000 mL (04/15/24 0518)   norepinephrine  (LEVOPHED ) Adult infusion 3 mcg/min (04/15/24 0400)   acetaminophen  **OR** acetaminophen  (TYLENOL ) oral liquid 160 mg/5 mL **OR** acetaminophen , alteplase , anticoagulant sodium citrate , docusate, feeding supplement (NEPRO CARB STEADY), heparin , heparin , lidocaine  (PF), lidocaine -prilocaine , ondansetron  (ZOFRAN ) IV, mouth  rinse, pentafluoroprop-tetrafluoroeth, polyethylene glycol, sodium chloride  flush   Nan Aver, MD 6:01 AM 04/15/2024

## 2024-04-15 NOTE — Progress Notes (Addendum)
 NAME:  Jeffrey [name], MRN:  [id number], DOB:  [birth year], LOS: 7 ADMISSION DATE:  04/08/2024, CONSULTATION DATE: 04/09/2024 REFERRING MD: EDP, CHIEF COMPLAINT: cardiac arrest    History of Present Illness:  A 63 yo male poorly compliant with dialysis (leaves ama as well), presented to hospital for sessions of late; recent hospitalization late April for undetermined arrhythmia who called ems with resp distress found to be in pea and acls started with EMS services. Reportedly missed multiple sessions of dialysis last week. Per chart review pt was in PEA, undergoing ACLS for ~23 mins until ROSC and unfortunately deteriorated shortly after again in PEA ~10 mins with ROSC now on escalated pressors and inotropic support.    CXR revealed pulmonary edema. EDP was gracious to place CVC and arterial line for pt. Unable to reach family at this time. CCM was asked to admit pt. All history is obtained from chart review as pt is intubated and unresponsive.    Cth pending   Pt seen with R facial twitching, unresponsive on low dose dose propofol , no purposeful movement. On 20epi and 0.03 vasopressin . NE on hold. Pt was given TNK during code and has epistaxis and bleeding from line sites. Labs being sent now.   Pertinent  Medical History  ESRD on HD, poorly adherent OSA, intermittent compliance with cpap T2DM with hyperglycemia HTN Hyperlipidemia Chronic anemia 2/2 ESRD COPD Chronic hypoxic resp failure on 3L baseline  Significant Hospital Events: Including procedures, antibiotic start and stop dates in addition to other pertinent events   5/12: admitted to ICU post arrest  5/13: on HD, Propofol  for sedation, PRVC 26/490/+8/70%, ABG 7.29/56/75/93%. Positive fluid balance 1.3 since admission.  5/14: hypotension in am, off Levophed  since noon time after IV Albumin , on insulin  drip, on NS 0.9% @ 75 cc/hr, PRVC 26/490/+8/50%. On Propofol  and Fentanyl , on TTM. ABG is pending. Negative 1.1 L with HD, now  positive 0.6 L fluid balance in the last few hrs. On TF 5/15 1 PRBC transition off insulin  gtt  5/16 for MRI brain. Severe anoxic injury 5/17 palli consult  5/18 incr FiO2. Weaning pressors   Interim History / Subjective:   Fio2 70% and 8 peep; sats 93% NE 2 Unresponsive  Objective    Blood pressure 111/63, pulse 65, temperature (!) 96.3 F (35.7 C), resp. rate (!) 26, height 5\' 5"  (1.651 m), weight 135.4 kg, SpO2 96%.    Vent Mode: PRVC FiO2 (%):  [60 %-70 %] 70 % Set Rate:  [26 bmp] 26 bmp Vt Set:  [490 mL] 490 mL PEEP:  [8 cmH20] 8 cmH20 Plateau Pressure:  [25 cmH20-28 cmH20] 26 cmH20   Intake/Output Summary (Last 24 hours) at 04/15/2024 4098 Last data filed at 04/15/2024 0500 Gross per 24 hour  Intake 1819.24 ml  Output 625 ml  Net 1194.24 ml   Filed Weights   04/10/24 0326 04/11/24 1050 04/14/24 0500  Weight: (!) 140.8 kg (!) 136.8 kg 135.4 kg    Examination: General:  critically ill appearing on mech vent HEENT: MM pink/moist; ETT in place; left scleral redness Neuro: unresponsive; no cough/gag reflex; pupils 5 mm bilaterally;  CV: s1s2, RRR, no m/r/g PULM:  dim clear BS bilaterally; on mech vent PRVC GI: soft, bsx4 active  Extremities: warm/dry, lle aka; lue avf  Resolved problem list   Lactic acidosis  Shock  HyperK   Assessment and Plan   OOH cardiac arrest x 2 Anoxic brain injury Brain compression Sz-like activity  -23 min and  10 min PEA arrests MRI brain confirms anoxic encephalopathy associated diffuse edema and tonsillar herniation  -completed ttm course w/ severe diffuse encephalopathy no seizures P: -limit sedating meds -cont keppra   AoC hypoxic and hypercarbic resp failure COPD w/o exacerbation  Pseudomonas PNA OSA  P: -LTVV strategy with tidal volumes of 6-8 cc/kg ideal body weight -Wean PEEP/FiO2 for SpO2 >92% -VAP bundle in place -cont cefepime   Hypotension -hypotension likely multifactorial - likely sepsis +/- neurogenic  related P: -levo for sbp goal >90 -cefepime  -midodrine   Hx HTN dHF P: -hold home anti-htn meds while hypotensive -HD for volume removal  AoC anemia Thrombocytopenia -1 PRBC 5/15 for hgb 6.8  P: -trend CBC and transfuse for Hgb<7  ESRD Hyponatremia, mild  P: -cont HD per nephro TTS -Trend BMP / urinary output -Replace electrolytes as indicated -Avoid nephrotoxic agents, ensure adequate renal perfusion    Elevated LFTs  P: -PRN LFTs   DM2 w hyperglycemia P: -cbg still elevated; increase semglee  to 60 bid -cont rSSI and TF covereage 20 units q4  GERD  P: -PPI  RLE pressure wound  P: -WOC  Inadequate PO intake P: -EN per RDN   GOC P: - daughter notified about MRI results. palliative care consulted   Best Practice (right click and "Reselect all SmartList Selections" daily)   Diet/type: tube feeds DVT prophylaxis prophylactic heparin   Pressure ulcer(s): identified on: 14/May/2025 GI prophylaxis: PPI Lines: Central line and Arterial Line Foley:  removal ordered per nephro Code Status:  full code Last date of multidisciplinary goals of care discussion [5/19 daughter Margarett Shavers updated at bedside. Palliative care following]   CRITICAL CARE Performed by: Casimiro Cleaves   Total critical care time: 35 minutes  JD Carliss Chess [facility] 04/15/2024, 7:42 AM  Please see [email or web address] for pager details.  From 7A-7P if no response, please call [phone]. After hours, please call ELink [phone].

## 2024-04-15 NOTE — Progress Notes (Signed)
 This chaplain responded to PMT NP-Shae consult for spiritual care in the setting of family support. The chaplain reviewed the Pt. chart notes and received an update from the Pt. RN-Jose. The chaplain understands the Pt. family stepped away from the bedside.  This chaplain will plan a revisit.  Chaplain Kathleene Papas [phone]

## 2024-04-15 NOTE — Progress Notes (Signed)
 Cross covering ICU physician  Called to bedside emergently for acute decompensation of pt with bradycardia, hypoxia despite 100% fio2. Given bicarb, atropine, started on epi and vaso as well as escalating levo gtt.   Pt able to be stabilized on the additional support however considering the symptoms/presentation and known tonsillar herniation on mri 5/16: 1.Findings compatible with severe/global hypoxic/ischemic encephalopathy, detailed above. 2.Resulting diffuse edema with sulcal effacement, basal cistern effacement and inferior cerebellar tonsillar herniation. Mild hydrocephalus.   D/w family who rapidly came to bedside from home and are with pt. I have discussed with them the potential need for brain death testing in am if he survives. I have advised them that should his heart stop that chest compressions/medication/shock would not ultimately change the outcome.   Due to the fact his herniation has been noted to be present since the 16th and due to global hypoxic injury the utility of any triple H therapy is poor.   D/w family his grave prognosis.

## 2024-04-16 DIAGNOSIS — Z66 Do not resuscitate: Secondary | ICD-10-CM

## 2024-04-16 DIAGNOSIS — G931 Anoxic brain damage, not elsewhere classified: Secondary | ICD-10-CM

## 2024-04-16 DIAGNOSIS — Z515 Encounter for palliative care: Secondary | ICD-10-CM

## 2024-04-16 DIAGNOSIS — G935 Compression of brain: Secondary | ICD-10-CM

## 2024-04-16 LAB — GLUCOSE, CAPILLARY: Glucose-Capillary: 322 mg/dL — ABNORMAL HIGH (ref 70–99)

## 2024-04-16 MED ORDER — SODIUM BICARBONATE 8.4 % IV SOLN: 50.0000 meq | Freq: Once | INTRAVENOUS | Status: DC

## 2024-04-16 MED ORDER — MORPHINE SULFATE (PF) 2 MG/ML IV SOLN
INTRAVENOUS | Status: AC
  Filled 2024-04-16: qty 2

## 2024-04-16 MED ORDER — MORPHINE SULFATE (PF) 2 MG/ML IV SOLN
4.0000 mg | Freq: Once | INTRAVENOUS | Status: AC
  Administered 2024-04-16: 4 mg via INTRAVENOUS

## 2024-04-28 NOTE — Significant Event (Signed)
 PCCM INTERVAL PROGRESS NOTE  Called to bedside by EMD for patient nearing cardiac arrest. Briefly this is a 63 year old male with ESRD who suffered cardiac arrest and unfortunately suffered severe anoxic injury by exam and MRI. Tonight he has had labile blood pressures nearing the point of arrest once previously, but improved with bicarb and atropine. Hypercarbic. Vent adjusted to maximize minute ventilation.   Now upon my arrival to bedside he has HR 40 and BP 60s/40s despite re-dosing of atropine, bicarb, calcium  (K 5.3 earlier). HR dropped further (20s) and we were about to begin CPR when the patient's mother asked us  to stop and not do compressions. She asked for us  to give him pain medication. I confirmed with the daughter who agreed with DNR-Comfort. Morphine  4mg  given. Family took some time to spend with the patient and when they were ready pressors were discontinued and very shortly after he became asystolic. Time of dead 0121.    Roz Cornelia, AGACNP-BC [facility]  See Amion for personal pager PCCM on call pager [phone] until 7pm. Please call Elink 7p-7a. [phone]  04/20/2024 1:29 AM

## 2024-04-28 NOTE — Death Summary Note (Signed)
 DEATH SUMMARY   Patient Details  Name: Jeffrey [name] MRN: [id number] DOB: May 18, 1961  Admission/Discharge Information   Admit Date:  05/04/24  Date of Death: Date of Death: 2024/05/12  Time of Death: Time of Death: 0124  Length of Stay: 8  Referring Physician: Trellis Fries, MD   Reason(s) for Hospitalization  Cardiac arrest  Diagnoses  Preliminary cause of death: anoxic brain injury Secondary Diagnoses (including complications and co-morbidities):  Principal Problem:   Cardiac arrest Jeffrey [name]) Active Problems:   Chronic diastolic heart failure (HCC)   S/P AKA (above knee amputation), left (HCC)   Acquired absence of left leg below knee (HCC)   Mild protein-calorie malnutrition (HCC)   Acute on chronic respiratory failure with hypoxia (HCC)   ESRD on dialysis (HCC)   Anemia due to chronic kidney disease, on chronic dialysis (HCC)   Anoxic brain injury (HCC)   Brain compression (HCC)   Herniation, brain (stem) (HCC)   DNR (do not resuscitate)   Comfort measures only status   Brief Hospital Course (including significant findings, care, treatment, and services provided and events leading to death)  Jeffrey [name] is a 63 y.o. year old male who history of diabetes OSA/OHS chronic hypoxemia ESRD on HD noncompliant with dialysis admitted to the hospital after cardiac arrest.  Reported 23 minutes of ACLS before ROSC.  Additional PEA arrest with 10 additional minutes of ACLS before ROSC.  His neurologic exam was very poor afterwards.  He was observed for several days.  MRI demonstrated severe anoxic injury 4 days after admission.  He developed worsening signs of brain herniation worsening or declining brainstem reflexes.  Developed profound shock and bradycardia clearly actively herniating and dying.  Family was called to the bedside.  They elected for comfort measures, patient died peacefully thereafter.    Pertinent Labs and Studies  Significant Diagnostic Studies DG CHEST PORT 1  VIEW Result Date: 04/15/2024 CLINICAL DATA:  Hypoxia, cardiopulmonary arrest EXAM: PORTABLE CHEST 1 VIEW COMPARISON:  04/14/2024 FINDINGS: Endotracheal tube seen 4.7 cm above the carina. Nasogastric tube extends into the upper abdomen beyond the margin of the examination. Lung volumes are small, however, pulmonary insufflation is stable. Small to moderate bilateral pleural effusions are present. Superimposed perihilar and lower lung zone pulmonary infiltrate is progressive most suggestive of moderate interstitial and alveolar pulmonary edema. No pneumothorax. Cardiac size is stable. Vascular stent noted within the expected brachycephalic artery. No acute bone abnormality IMPRESSION: 1. Support tubes in appropriate position. 2. Progressive moderate interstitial and alveolar pulmonary edema. 3. Small to moderate bilateral pleural effusions, stable. Electronically Signed   By: Worthy Heads M.D.   On: 04/15/2024 21:23   DG CHEST PORT 1 VIEW Result Date: 04/14/2024 CLINICAL DATA:  Follow-up after CPR EXAM: PORTABLE CHEST 1 VIEW COMPARISON:  05-04-2024 FINDINGS: Single AP view of the chest which is significantly degraded by AP portable technique and patient body habitus. Endotracheal tube terminates 3.1 cm above carina. Nasogastric tube extends beyond the inferior aspect of the film. Midline trachea. Moderate cardiomegaly, accentuated by AP portable technique. Probable small bilateral pleural effusions. No pneumothorax. Significantly improved, mild interstitial edema. Bibasilar airspace disease is new or increased. Right brachiocephalic stent. IMPRESSION: Limited exam as detailed above secondary to patient body habitus. Cardiomegaly with improved mild congestive heart failure. Probable small bilateral pleural effusions with worsening bibasilar aeration, most likely due to atelectasis. Cannot exclude right lower lobe pneumonia or aspiration. Electronically Signed   By: Lore Rode M.D.   On: 04/14/2024  11:47   MR  BRAIN WO CONTRAST Result Date: 04/12/2024 CLINICAL DATA:  Anoxic brain damage EXAM: MRI HEAD WITHOUT CONTRAST TECHNIQUE: Multiplanar, multiecho pulse sequences of the brain and surrounding structures were obtained without intravenous contrast. COMPARISON:  CT head Apr 08, 2024. FINDINGS: Brain: Extensive/diffuse restricted diffusion throughout the gray matter, both infratentorial and supratentorial cortical and deep gray. Associated edema with diffuse sulcal effacement and basal cistern effacement with severe inferior cerebellar tonsillar descent and crowding at the foramen magnum. Associated mild hydrocephalus. No evidence of mass occupying acute hemorrhage. There is evidence of venous congestion on susceptibility weighted imaging. Vascular: Poorly visualized major arterial flow voids. Skull and upper cervical spine: Normal marrow signal. Sinuses/Orbits: Paranasal sinus mucosal thickening. No acute orbital findings. IMPRESSION: Findings compatible with severe/global hypoxic/ischemic encephalopathy, detailed above. Resulting diffuse edema with sulcal effacement, basal cistern effacement and inferior cerebellar tonsillar herniation. Mild hydrocephalus. These results will be called to the ordering clinician or representative by the Radiologist Assistant, and communication documented in the PACS or [employer]. Electronically Signed   By: Stevenson Elbe M.D.   On: 04/12/2024 18:05   ECHOCARDIOGRAM LIMITED Result Date: 04/09/2024    ECHOCARDIOGRAM LIMITED REPORT   Patient Name:   Jeffrey [name] Date of Exam: 04/09/2024 Medical Rec #:  [id number]     Height:       65.0 in Accession #:    [id number]    Weight:       308.2 lb Date of Birth:  1960/12/27     BSA:          2.377 m Patient Age:    62 years      BP:           208/80 mmHg Patient Gender: M             HR:           98 bpm. Exam Location:  Inpatient Procedure: Limited Echo, Color Doppler, Cardiac Doppler and Intracardiac            Opacification Agent  (Both Spectral and Color Flow Doppler were            utilized during procedure). Indications:    Cardiac Arrest  History:        Patient has prior history of Echocardiogram examinations.                 Previous Myocardial Infarction, COPD, Signs/Symptoms:Dyspnea;                 Risk Factors:Diabetes, Sleep Apnea and Hypertension.  Sonographer:    Willey Harrier Referring Phys: JESSICA MARSHALL IMPRESSIONS  1. Left ventricular ejection fraction, by estimation, is 70 to 75%. The left ventricle has hyperdynamic function.  2. The mitral valve is grossly normal. No evidence of mitral valve regurgitation. The mean mitral valve gradient is 5.0 mmHg with average heart rate of 98 bpm.  3. The aortic valve is calcified. There is mild calcification of the aortic valve. Aortic valve regurgitation is not visualized. Aortic valve sclerosis/calcification is present, without any evidence of aortic stenosis.  4. The inferior vena cava is normal in size with <50% respiratory variability, suggesting right atrial pressure of 8 mmHg. Comparison(s): Prior images reviewed side by side. Vigorous LV function. FINDINGS  Left Ventricle: Left ventricular ejection fraction, by estimation, is 70 to 75%. The left ventricle has hyperdynamic function. Definity  contrast agent was given IV to delineate the left ventricular endocardial borders. Mitral Valve: The mitral valve  is grossly normal. The mean mitral valve gradient is 5.0 mmHg with average heart rate of 98 bpm. Tricuspid Valve: The tricuspid valve is normal in structure. Tricuspid valve regurgitation is not demonstrated. No evidence of tricuspid stenosis. Aortic Valve: The aortic valve is calcified. There is mild calcification of the aortic valve. Aortic valve regurgitation is not visualized. Aortic valve sclerosis/calcification is present, without any evidence of aortic stenosis. Pulmonic Valve: The pulmonic valve was normal in structure. Pulmonic valve regurgitation is not visualized. No  evidence of pulmonic stenosis. Venous: The inferior vena cava is normal in size with less than 50% respiratory variability, suggesting right atrial pressure of 8 mmHg. IVC IVC diam: 1.90 cm MITRAL VALVE MV Mean grad: 5.0 mmHg Gloriann Larger MD Electronically signed by Gloriann Larger MD Signature Date/Time: 04/09/2024/2:41:08 PM    Final    EEG adult Result Date: 04/09/2024 Arleene Lack, MD     04/09/2024 10:17 AM Patient Name: WILLE AUBUCHON MRN: [id number] Epilepsy Attending: Arleene Lack Referring Physician/Provider: Lanell Pinta, MD Date: 04/09/2024 Duration: 22.39 mins Patient history: 63yo M s/p cardiac arrest. EEG to evaluate for seizure Level of alertness:  comatose AEDs during EEG study: Propofol , LEV Technical aspects: This EEG study was done with scalp electrodes positioned according to the 10-20 International system of electrode placement. Electrical activity was reviewed with band pass filter of 1-70Hz , sensitivity of 7 uV/mm, display speed of 31mm/sec with a 60Hz  notched filter applied as appropriate. EEG data were recorded continuously and digitally stored.  Video monitoring was available and reviewed as appropriate. Description: EEG showed burst suppression pattern with burst of 3-5hz  theta-delta slowing lasting 2-4 seconds alternating with 30 seconds of generalized suppression. Hyperventilation and photic stimulation were not performed.  EKG artifact was seen during the study. ABNORMALITY - Burst suppression, generalized IMPRESSION: This study is suggestive of severe to profound diffuse encephalopathy. No seizures or epileptiform discharges were seen throughout the recording. Priyanka O Yadav   CT Angio Chest PE W and/or Wo Contrast Result Date: 04/09/2024 CLINICAL DATA:  Recent cardiac arrest and shortness of breath, initial encounter EXAM: CT ANGIOGRAPHY CHEST WITH CONTRAST TECHNIQUE: Multidetector CT imaging of the chest was performed using the standard protocol during bolus  administration of intravenous contrast. Multiplanar CT image reconstructions and MIPs were obtained to evaluate the vascular anatomy. RADIATION DOSE REDUCTION: This exam was performed according to the departmental dose-optimization program which includes automated exposure control, adjustment of the mA and/or kV according to patient size and/or use of iterative reconstruction technique. CONTRAST:  75mL OMNIPAQUE  IOHEXOL  350 MG/ML SOLN COMPARISON:  Chest x-ray from earlier in the same day. FINDINGS: Cardiovascular: Thoracic aorta shows no aneurysmal dilatation or dissection pulmonary artery shows no filling defect to suggest pulmonary embolism. Mild coronary calcifications are seen. Contrast reflux into the IVC is noted likely related to poor cardiac output. Mediastinum/Nodes: Thoracic inlet is within normal limits. No hilar or mediastinal adenopathy is noted. Endotracheal tube and gastric catheter are noted in satisfactory position. Lungs/Pleura: Lungs demonstrate bilateral pleural effusions and lower lobe atelectatic changes. No sizable parenchymal nodule is noted. Upper Abdomen: Visualized upper abdomen shows no acute abnormality. Musculoskeletal: No acute rib abnormality is noted. Degenerative changes of the thoracic spine are seen. Review of the MIP images confirms the above findings. IMPRESSION: No evidence of pulmonary embolism. Bilateral effusions and lower lobe atelectasis. Electronically Signed   By: Violeta Grey M.D.   On: 04/09/2024 00:29   CT Head Wo Contrast Result Date: 04/09/2024 CLINICAL DATA:  Recent cardiac arrest with altered mental status EXAM: CT HEAD WITHOUT CONTRAST TECHNIQUE: Contiguous axial images were obtained from the base of the skull through the vertex without intravenous contrast. RADIATION DOSE REDUCTION: This exam was performed according to the departmental dose-optimization program which includes automated exposure control, adjustment of the mA and/or kV according to patient size  and/or use of iterative reconstruction technique. COMPARISON:  None Available. FINDINGS: Brain: No evidence of acute infarction, hemorrhage, hydrocephalus, extra-axial collection or mass lesion/mass effect. Images are somewhat degraded by motion artifact. Vascular: No hyperdense vessel or unexpected calcification. Skull: Normal. Negative for fracture or focal lesion. Sinuses/Orbits: Mucosal retention cyst is noted in the left maxillary antrum. Other: Nasal airway, endotracheal tube and nasogastric catheter are seen. IMPRESSION: No acute abnormality noted. Some limitations due to motion artifact are seen. Repeat as clinically indicated. Electronically Signed   By: Violeta Grey M.D.   On: 04/09/2024 00:27   DG Chest Portable 1 View Result Date: 04/08/2024 CLINICAL DATA:  Status post CPR EXAM: PORTABLE CHEST 1 VIEW COMPARISON:  03/21/2024 FINDINGS: Endotracheal tube is noted approximately 1 cm into the right mainstem bronchus this should be withdrawn 2-3 cm. Gastric catheter extends to the stomach. Postsurgical changes in the cervical spine are seen. Cardiac shadow is stable. Increased central vascular congestion is noted. Poor inspiratory effort accentuates the congestion. No focal confluent infiltrate is seen. IMPRESSION: Tubes and lines as described. The endotracheal tube should be withdrawn 1-2 cm. Central vascular congestion. Critical Value/emergent results were called by telephone at the time of interpretation on 04/08/2024 at 10:19 pm to Dr. Shyrl Doyne , who verbally acknowledged these results. Electronically Signed   By: Violeta Grey M.D.   On: 04/08/2024 22:21   ECHOCARDIOGRAM COMPLETE Result Date: 03/22/2024    ECHOCARDIOGRAM REPORT   Patient Name:   [name] Date of Exam: 03/22/2024 Medical Rec #:  [id number]     Height:       65.0 in Accession #:    [id number]    Weight:       292.0 lb Date of Birth:  17-Dec-1960     BSA:          2.323 m Patient Age:    62 years      BP:           158/75 mmHg  Patient Gender: M             HR:           101 bpm. Exam Location:  Inpatient Procedure: 2D Echo (Both Spectral and Color Flow Doppler were utilized during            procedure). Indications:    Ventricular tachycardia  History:        Patient has prior history of Echocardiogram examinations, most                 recent 09/17/2023. End stage renal disease and Pulmonary HTN;                 Risk Factors:Hypertension, Diabetes, Sleep Apnea and Former                 Smoker.  Sonographer:    Dione Franks RDCS Referring Phys: [id number] ANGELA NICOLE DUKE  Sonographer Comments: Patient is obese. Image acquisition challenging due to patient body habitus. IMPRESSIONS  1. Left ventricular ejection fraction, by estimation, is 60 to 65%. The left ventricle has normal function. The left ventricle has no regional  wall motion abnormalities. There is mild concentric left ventricular hypertrophy. Left ventricular diastolic parameters are consistent with Grade II diastolic dysfunction (pseudonormalization).  2. Right ventricular systolic function is normal. The right ventricular size is normal. Tricuspid regurgitation signal is inadequate for assessing PA pressure.  3. Left atrial size was mildly dilated.  4. The mitral valve is normal in structure. No evidence of mitral valve regurgitation. No evidence of mitral stenosis.  5. The aortic valve is tricuspid. There is moderate calcification of the aortic valve. Aortic valve regurgitation is not visualized. Mild aortic valve stenosis. Aortic valve mean gradient measures 10.0 mmHg.  6. The inferior vena cava is dilated in size with <50% respiratory variability, suggesting right atrial pressure of 15 mmHg. FINDINGS  Left Ventricle: Left ventricular ejection fraction, by estimation, is 60 to 65%. The left ventricle has normal function. The left ventricle has no regional wall motion abnormalities. Definity  contrast agent was given IV to delineate the left ventricular  endocardial  borders. The left ventricular internal cavity size was normal in size. There is mild concentric left ventricular hypertrophy. Left ventricular diastolic parameters are consistent with Grade II diastolic dysfunction (pseudonormalization). Right Ventricle: The right ventricular size is normal. No increase in right ventricular wall thickness. Right ventricular systolic function is normal. Tricuspid regurgitation signal is inadequate for assessing PA pressure. Left Atrium: Left atrial size was mildly dilated. Right Atrium: Right atrial size was normal in size. Pericardium: There is no evidence of pericardial effusion. Mitral Valve: The mitral valve is normal in structure. Mild mitral annular calcification. No evidence of mitral valve regurgitation. No evidence of mitral valve stenosis. Tricuspid Valve: The tricuspid valve is normal in structure. Tricuspid valve regurgitation is not demonstrated. Aortic Valve: The aortic valve is tricuspid. There is moderate calcification of the aortic valve. Aortic valve regurgitation is not visualized. Mild aortic stenosis is present. Aortic valve mean gradient measures 10.0 mmHg. Aortic valve peak gradient measures 18.1 mmHg. Aortic valve area, by VTI measures 2.67 cm. Pulmonic Valve: The pulmonic valve was normal in structure. Pulmonic valve regurgitation is not visualized. Aorta: The aortic root is normal in size and structure. Venous: The inferior vena cava is dilated in size with less than 50% respiratory variability, suggesting right atrial pressure of 15 mmHg. IAS/Shunts: No atrial level shunt detected by color flow Doppler.  LEFT VENTRICLE PLAX 2D LVIDd:         5.20 cm   Diastology LVIDs:         3.40 cm   LV e' medial:    10.70 cm/s LV PW:         1.20 cm   LV E/e' medial:  15.4 LV IVS:        1.20 cm   LV e' lateral:   12.90 cm/s LVOT diam:     2.20 cm   LV E/e' lateral: 12.8 LV SV:         98 LV SV Index:   42 LVOT Area:     3.80 cm  RIGHT VENTRICLE             IVC RV S  prime:     15.70 cm/s  IVC diam: 2.60 cm TAPSE (M-mode): 2.3 cm LEFT ATRIUM             Index LA diam:        4.50 cm 1.94 cm/m LA Vol (A2C):   67.8 ml 29.18 ml/m LA Vol (A4C):   81.2 ml 34.95 ml/m LA Biplane  Vol: 81.3 ml 34.99 ml/m  AORTIC VALVE AV Area (Vmax):    2.48 cm AV Area (Vmean):   2.52 cm AV Area (VTI):     2.67 cm AV Vmax:           213.00 cm/s AV Vmean:          144.000 cm/s AV VTI:            0.367 m AV Peak Grad:      18.1 mmHg AV Mean Grad:      10.0 mmHg LVOT Vmax:         139.00 cm/s LVOT Vmean:        95.600 cm/s LVOT VTI:          0.258 m LVOT/AV VTI ratio: 0.70  AORTA Ao Root diam: 3.10 cm Ao Asc diam:  2.80 cm MITRAL VALVE MV Area (PHT): 3.60 cm     SHUNTS MV Decel Time: 211 msec     Systemic VTI:  0.26 m MV E velocity: 165.00 cm/s  Systemic Diam: 2.20 cm MV A velocity: 153.00 cm/s MV E/A ratio:  1.08 Dalton McleanMD Electronically signed by Archer Bear Signature Date/Time: 03/22/2024/10:23:33 AM    Final    DG Chest Port 1 View Result Date: 03/21/2024 CLINICAL DATA:  Chest pain. EXAM: PORTABLE CHEST 1 VIEW COMPARISON:  March 08, 2024. FINDINGS: Mild cardiomegaly is noted with mild central pulmonary vascular congestion. No consolidative process is noted. Status post right shoulder arthroplasty. IMPRESSION: Mild cardiomegaly with mild central pulmonary vascular congestion. Electronically Signed   By: Rosalene Colon M.D.   On: 03/21/2024 16:24    Microbiology Recent Results (from the past 240 hours)  MRSA Next Gen by PCR, Nasal     Status: Abnormal   Collection Time: 04/09/24 12:32 AM   Specimen: Nasal Mucosa; Nasal Swab  Result Value Ref Range Status   MRSA by PCR Next Gen DETECTED (A) NOT DETECTED Final    Comment: RESULTS CALLED TO, READ BACK BY AND VERIFIED WITH RN B.WARNER ON 04/09/24 AT 0220 BY NM (NOTE) The GeneXpert MRSA Assay (FDA approved for NASAL specimens only), is one component of a comprehensive MRSA colonization surveillance program. It is not intended  to diagnose MRSA infection nor to guide or monitor treatment for MRSA infections. Test performance is not FDA approved in patients less than 9 years old. Performed at [facility], [address], [place], [place] [address]   Culture, Respiratory w Gram Stain     Status: None   Collection Time: 04/11/24  8:37 AM   Specimen: Tracheal Aspirate; Respiratory  Result Value Ref Range Status   Specimen Description TRACHEAL ASPIRATE  Final   Special Requests NONE  Final   Gram Stain   Final    FEW WBC PRESENT, PREDOMINANTLY PMN RARE GRAM POSITIVE COCCI IN PAIRS RARE GRAM NEGATIVE RODS Performed at [facility], [address] [place], [place] [address]    Culture ABUNDANT PSEUDOMONAS AERUGINOSA  Final   Report Status 04/13/2024 FINAL  Final   Organism ID, Bacteria PSEUDOMONAS AERUGINOSA  Final      Susceptibility   Pseudomonas aeruginosa - MIC*    CEFTAZIDIME 2 SENSITIVE Sensitive     CIPROFLOXACIN <=0.25 SENSITIVE Sensitive     GENTAMICIN <=1 SENSITIVE Sensitive     IMIPENEM 1 SENSITIVE Sensitive     PIP/TAZO <=4 SENSITIVE Sensitive ug/mL    CEFEPIME  1 SENSITIVE Sensitive     * ABUNDANT PSEUDOMONAS AERUGINOSA  Lab Basic Metabolic Panel: Recent Labs  Lab 04/10/24 0407 04/10/24 1043 04/10/24 1830 04/11/24 0300 04/12/24 0300 04/13/24 0405 04/14/24 0243 04/15/24 0400 04/15/24 0830 04/15/24 2009  NA  --    < >  --  136 132* 131* 132* 130*  --  129*  K  --    < >  --  3.9 4.1 3.8 4.7 4.7  --  5.3*  CL  --    < >  --  100 95* 93* 95* 94*  --   --   CO2  --    < >  --  23 26 24 23 24   --   --   GLUCOSE  --    < >  --  167* 336* 321* 218* 304*  --   --   BUN  --    < >  --  79* 63* 87* 105* 83*  --   --   CREATININE  --    < >  --  5.85* 4.81* 5.56* 6.47* 6.05*  --   --   CALCIUM   --    < >  --  8.0* 7.9* 8.4* 8.3* 8.6*  --   --   MG 1.9  --  2.4 2.3 2.1 2.4  --   --   --   --   PHOS 3.5  --  3.7 4.2 3.8 5.8*  --   --  5.9*  --    < > = values in this interval  not displayed.   Liver Function Tests: Recent Labs  Lab 04/10/24 0407 04/11/24 0300 04/12/24 0300 04/13/24 0405  AST 172* 53* 37 24  ALT 530* 283* 194* 140*  ALKPHOS 84 63 84 92  BILITOT 0.4 0.5 0.7 0.5  PROT 6.2* 5.5* 5.7* 5.6*  ALBUMIN  2.8* 2.7* 2.4* 2.3*   No results for input(s): "LIPASE", "AMYLASE" in the last 168 hours. No results for input(s): "AMMONIA" in the last 168 hours. CBC: Recent Labs  Lab 04/11/24 0300 04/11/24 1258 04/12/24 0300 04/13/24 0405 04/14/24 0243 04/15/24 0400 04/15/24 2009  WBC 8.5  --  8.0 6.5 9.6 7.9  --   HGB 6.8*   < > 7.6* 7.6* 8.3* 7.8* 8.8*  HCT 21.8*   < > 24.2* 24.6* 28.0* 26.4* 26.0*  MCV 92.8  --  92.4 93.2 94.0 94.6  --   PLT 103*  --  103* 90* 130* 102*  --    < > = values in this interval not displayed.   Cardiac Enzymes: No results for input(s): "CKTOTAL", "CKMB", "CKMBINDEX", "TROPONINI" in the last 168 hours. Sepsis Labs: Recent Labs  Lab 04/11/24 0300 04/12/24 0300 04/13/24 0405 04/14/24 0243 04/15/24 0400  PROCALCITON 23.66  --   --   --   --   WBC 8.5 8.0 6.5 9.6 7.9    Procedures/Operations  As per EMR   Archer Kobs Isack Lavalley 04/04/2024, 3:50 PM

## 2024-04-28 NOTE — Progress Notes (Signed)
 Pt extubated per family request/physician order at 0125. Family remained at pt bedside during. RT will continue to be available as needed.

## 2024-04-28 DEATH — deceased
